# Patient Record
Sex: Female | Born: 1955
Health system: Southern US, Community
[De-identification: ages and names within clinical notes are randomized; demographics above are authoritative.]

## PROBLEM LIST (undated history)

## (undated) DIAGNOSIS — I739 Peripheral vascular disease, unspecified: Secondary | ICD-10-CM

## (undated) DIAGNOSIS — R0600 Dyspnea, unspecified: Secondary | ICD-10-CM

## (undated) DIAGNOSIS — Z8673 Personal history of transient ischemic attack (TIA), and cerebral infarction without residual deficits: Secondary | ICD-10-CM

## (undated) DIAGNOSIS — I2510 Atherosclerotic heart disease of native coronary artery without angina pectoris: Secondary | ICD-10-CM

## (undated) DIAGNOSIS — I639 Cerebral infarction, unspecified: Secondary | ICD-10-CM

## (undated) DIAGNOSIS — I219 Acute myocardial infarction, unspecified: Secondary | ICD-10-CM

## (undated) DIAGNOSIS — J189 Pneumonia, unspecified organism: Secondary | ICD-10-CM

## (undated) DIAGNOSIS — K219 Gastro-esophageal reflux disease without esophagitis: Secondary | ICD-10-CM

## (undated) DIAGNOSIS — IMO0001 Reserved for inherently not codable concepts without codable children: Secondary | ICD-10-CM

## (undated) DIAGNOSIS — J449 Chronic obstructive pulmonary disease, unspecified: Secondary | ICD-10-CM

## (undated) DIAGNOSIS — I350 Nonrheumatic aortic (valve) stenosis: Secondary | ICD-10-CM

## (undated) DIAGNOSIS — J45909 Unspecified asthma, uncomplicated: Secondary | ICD-10-CM

## (undated) DIAGNOSIS — F419 Anxiety disorder, unspecified: Secondary | ICD-10-CM

## (undated) HISTORY — PX: CARDIAC CATHETERIZATION: SHX172

## (undated) HISTORY — PX: APPENDECTOMY: SHX54

## (undated) HISTORY — PX: CHOLECYSTECTOMY: SHX55

---

## 2005-10-13 ENCOUNTER — Ambulatory Visit: Payer: Self-pay

## 2014-06-18 ENCOUNTER — Encounter (INDEPENDENT_AMBULATORY_CARE_PROVIDER_SITE_OTHER): Payer: Self-pay | Admitting: *Deleted

## 2014-07-03 ENCOUNTER — Encounter (INDEPENDENT_AMBULATORY_CARE_PROVIDER_SITE_OTHER): Payer: Self-pay | Admitting: *Deleted

## 2014-07-03 ENCOUNTER — Other Ambulatory Visit (INDEPENDENT_AMBULATORY_CARE_PROVIDER_SITE_OTHER): Payer: Self-pay | Admitting: *Deleted

## 2014-07-03 DIAGNOSIS — Z1211 Encounter for screening for malignant neoplasm of colon: Secondary | ICD-10-CM

## 2014-07-23 ENCOUNTER — Other Ambulatory Visit (HOSPITAL_COMMUNITY): Payer: Self-pay | Admitting: Internal Medicine

## 2014-07-23 DIAGNOSIS — Z1231 Encounter for screening mammogram for malignant neoplasm of breast: Secondary | ICD-10-CM

## 2014-08-02 ENCOUNTER — Ambulatory Visit (HOSPITAL_COMMUNITY)
Admission: RE | Admit: 2014-08-02 | Discharge: 2014-08-02 | Disposition: A | Discharge status: Home / Self Care | Payer: 59 | Source: Ambulatory Visit | Attending: Internal Medicine | Admitting: Internal Medicine

## 2014-08-02 DIAGNOSIS — Z1231 Encounter for screening mammogram for malignant neoplasm of breast: Secondary | ICD-10-CM

## 2014-08-13 ENCOUNTER — Telehealth (INDEPENDENT_AMBULATORY_CARE_PROVIDER_SITE_OTHER): Payer: Self-pay | Admitting: *Deleted

## 2014-08-13 MED ORDER — PEG 3350-KCL-NA BICARB-NACL 420 G PO SOLR: 4000.0000 mL | Freq: Once | ORAL | Status: DC

## 2014-08-13 NOTE — Telephone Encounter (Signed)
Patient needs trilyte 

## 2014-09-02 ENCOUNTER — Telehealth (INDEPENDENT_AMBULATORY_CARE_PROVIDER_SITE_OTHER): Payer: Self-pay | Admitting: *Deleted

## 2014-09-02 NOTE — Telephone Encounter (Signed)
Referring MD/PCP: vyas   Procedure: tcs  Reason/Indication:  screening  Has patient had this procedure before?  no  If so, when, by whom and where?    Is there a family history of colon cancer?  no  Who?  What age when diagnosed?    Is patient diabetic?   no      Does patient have prosthetic heart valve?  no  Do you have a pacemaker?  no  Has patient ever had endocarditis? no  Has patient had joint replacement within last 12 months?  no  Does patient tend to be constipated or take laxatives? no  Is patient on Coumadin, Plavix and/or Aspirin? yes  Medications: asa 81 mg daily, albuterol inhaler, symbicort inhaler  Allergies: nkda  Medication Adjustment: asa 2 days  Procedure date & time: 10/03/14 at 730

## 2014-09-04 NOTE — Telephone Encounter (Signed)
agree

## 2014-10-02 DIAGNOSIS — K648 Other hemorrhoids: Secondary | ICD-10-CM | POA: Diagnosis not present

## 2014-10-02 DIAGNOSIS — Z1211 Encounter for screening for malignant neoplasm of colon: Secondary | ICD-10-CM | POA: Diagnosis not present

## 2014-10-02 DIAGNOSIS — K5730 Diverticulosis of large intestine without perforation or abscess without bleeding: Secondary | ICD-10-CM | POA: Diagnosis not present

## 2014-10-03 ENCOUNTER — Encounter (HOSPITAL_COMMUNITY): Payer: Self-pay | Admitting: *Deleted

## 2014-10-03 ENCOUNTER — Ambulatory Visit (HOSPITAL_COMMUNITY)
Admission: RE | Admit: 2014-10-03 | Discharge: 2014-10-03 | Disposition: A | Discharge status: Home Health | Payer: 59 | Source: Ambulatory Visit | Attending: Internal Medicine | Admitting: Internal Medicine

## 2014-10-03 ENCOUNTER — Encounter (HOSPITAL_COMMUNITY)
Admission: RE | Disposition: A | Discharge status: Home Health | Payer: Self-pay | Source: Ambulatory Visit | Attending: Internal Medicine

## 2014-10-03 DIAGNOSIS — K5730 Diverticulosis of large intestine without perforation or abscess without bleeding: Secondary | ICD-10-CM | POA: Insufficient documentation

## 2014-10-03 DIAGNOSIS — K644 Residual hemorrhoidal skin tags: Secondary | ICD-10-CM | POA: Insufficient documentation

## 2014-10-03 DIAGNOSIS — K219 Gastro-esophageal reflux disease without esophagitis: Secondary | ICD-10-CM | POA: Insufficient documentation

## 2014-10-03 DIAGNOSIS — Z8371 Family history of colonic polyps: Secondary | ICD-10-CM | POA: Diagnosis not present

## 2014-10-03 DIAGNOSIS — J449 Chronic obstructive pulmonary disease, unspecified: Secondary | ICD-10-CM | POA: Insufficient documentation

## 2014-10-03 DIAGNOSIS — Z7982 Long term (current) use of aspirin: Secondary | ICD-10-CM | POA: Insufficient documentation

## 2014-10-03 DIAGNOSIS — Z87891 Personal history of nicotine dependence: Secondary | ICD-10-CM | POA: Insufficient documentation

## 2014-10-03 DIAGNOSIS — Z1211 Encounter for screening for malignant neoplasm of colon: Secondary | ICD-10-CM

## 2014-10-03 DIAGNOSIS — Z79899 Other long term (current) drug therapy: Secondary | ICD-10-CM | POA: Diagnosis not present

## 2014-10-03 HISTORY — PX: COLONOSCOPY: SHX5424

## 2014-10-03 HISTORY — DX: Reserved for inherently not codable concepts without codable children: IMO0001

## 2014-10-03 HISTORY — DX: Gastro-esophageal reflux disease without esophagitis: K21.9

## 2014-10-03 HISTORY — DX: Chronic obstructive pulmonary disease, unspecified: J44.9

## 2014-10-03 SURGERY — COLONOSCOPY
Anesthesia: Moderate Sedation

## 2014-10-03 MED ORDER — MIDAZOLAM HCL 5 MG/5ML IJ SOLN
INTRAMUSCULAR | Status: AC
  Filled 2014-10-03: qty 10

## 2014-10-03 MED ORDER — SODIUM CHLORIDE 0.9 % IV SOLN
INTRAVENOUS | Status: DC
  Administered 2014-10-03: 07:00:00 via INTRAVENOUS

## 2014-10-03 MED ORDER — MIDAZOLAM HCL 5 MG/5ML IJ SOLN
INTRAMUSCULAR | Status: DC | PRN
  Administered 2014-10-03 (×3): 2 mg via INTRAVENOUS
  Administered 2014-10-03: 3 mg via INTRAVENOUS
  Administered 2014-10-03 (×2): 2 mg via INTRAVENOUS
  Administered 2014-10-03: 3 mg via INTRAVENOUS

## 2014-10-03 MED ORDER — MEPERIDINE HCL 50 MG/ML IJ SOLN
INTRAMUSCULAR | Status: DC
Start: 2014-10-03 — End: 2014-10-03
  Filled 2014-10-03: qty 1

## 2014-10-03 MED ORDER — MEPERIDINE HCL 50 MG/ML IJ SOLN
INTRAMUSCULAR | Status: AC
  Filled 2014-10-03: qty 1

## 2014-10-03 MED ORDER — MIDAZOLAM HCL 5 MG/5ML IJ SOLN
INTRAMUSCULAR | Status: AC
  Filled 2014-10-03: qty 5

## 2014-10-03 MED ORDER — MEPERIDINE HCL 50 MG/ML IJ SOLN
INTRAMUSCULAR | Status: DC | PRN
  Administered 2014-10-03 (×4): 25 mg via INTRAVENOUS

## 2014-10-03 NOTE — Op Note (Signed)
COLONOSCOPY PROCEDURE REPORT  PATIENT:  Emily Rocha  MR#:  [id number] Birthdate:  19-Dec-1955, 59 y.o., female Endoscopist:  Dr. Rogene Houston, MD Referred By:  Dr. Glenda Chroman, MD Procedure Date: 10/03/2014  Procedure:   Colonoscopy  Indications: Patient is 59 year old Caucasian female was undergoing average risk screening colonoscopy. Family history is negative for CRC brother who is 4 years younger has had polyps removed.  Informed Consent:  The procedure and risks were reviewed with the patient and informed consent was obtained.  Medications:  Demerol 100 mg IV Versed 16 mg IV  Description of procedure:  After a digital rectal exam was performed, that colonoscope was advanced from the anus through the rectum and colon to the area of the cecum, ileocecal valve and appendiceal orifice. The cecum was deeply intubated. These structures were well-seen and photographed for the record. From the level of the cecum and ileocecal valve, the scope was slowly and cautiously withdrawn. The mucosal surfaces were carefully surveyed utilizing scope tip to flexion to facilitate fold flattening as needed. The scope was pulled down into the rectum where a thorough exam including retroflexion was performed. Ultra Slim scope was also used for this procedure.  Findings:   Prep satisfactory. Tortuous hepatic flexure. Ultra Slim scope was used to complete exam. Scattered diverticula at descending colon along with multiple diverticula at sigmoid colon. Normal rectal mucosa. 1 hemorrhoids below the dentate line to    Therapeutic/Diagnostic Maneuvers Performed:  None  Complications:  None  Cecal Withdrawal Time:  6 minutes  Impression:  Examination performed to cecum. Left-sided diverticulosis. She has multiple diverticula at sigmoid colon. Small external hemorrhoids.  Recommendations:  Standard instructions given. High fiber diet. Next screening exam in 10 years which should be with monitored  anesthesia care as she is hard to sedate.  REHMAN,NAJEEB U  10/03/2014 9:05 AM  CC: Dr. Glenda Chroman., MD & Dr. Rayne Du ref. provider found

## 2014-10-03 NOTE — Discharge Instructions (Signed)
Resume usual medications and high fiber diet. No driving for 24 hours. Next screening exam in 10 years.  Colonoscopy, Care After Refer to this sheet in the next few weeks. These instructions provide you with information on caring for yourself after your procedure. Your health care provider may also give you more specific instructions. Your treatment has been planned according to current medical practices, but problems sometimes occur. Call your health care provider if you have any problems or questions after your procedure. WHAT TO EXPECT AFTER THE PROCEDURE  After your procedure, it is typical to have the following:  A small amount of blood in your stool.  Moderate amounts of gas and mild abdominal cramping or bloating. HOME CARE INSTRUCTIONS  Do not drive, operate machinery, or sign important documents for 24 hours.  You may shower and resume your regular physical activities, but move at a slower pace for the first 24 hours.  Take frequent rest periods for the first 24 hours.  Walk around or put a warm pack on your abdomen to help reduce abdominal cramping and bloating.  Drink enough fluids to keep your urine clear or pale yellow.  You may resume your normal diet as instructed by your health care provider. Avoid heavy or fried foods that are hard to digest.  Avoid drinking alcohol for 24 hours or as instructed by your health care provider.  Only take over-the-counter or prescription medicines as directed by your health care provider.  If a tissue sample (biopsy) was taken during your procedure:  Do not take aspirin or blood thinners for 7 days, or as instructed by your health care provider.  Do not drink alcohol for 7 days, or as instructed by your health care provider.  Eat soft foods for the first 24 hours. SEEK MEDICAL CARE IF: You have persistent spotting of blood in your stool 2-3 days after the procedure. SEEK IMMEDIATE MEDICAL CARE IF:  You have more than a small  spotting of blood in your stool.  You pass large blood clots in your stool.  Your abdomen is swollen (distended).  You have nausea or vomiting.  You have a fever.  You have increasing abdominal pain that is not relieved with medicine. High-Fiber Diet Fiber is found in fruits, vegetables, and grains. A high-fiber diet encourages the addition of more whole grains, legumes, fruits, and vegetables in your diet. The recommended amount of fiber for adult males is 38 g per day. For adult females, it is 25 g per day. Pregnant and lactating women should get 28 g of fiber per day. If you have a digestive or bowel problem, ask your caregiver for advice before adding high-fiber foods to your diet. Eat a variety of high-fiber foods instead of only a select few type of foods.  PURPOSE  To increase stool bulk.  To make bowel movements more regular to prevent constipation.  To lower cholesterol.  To prevent overeating. WHEN IS THIS DIET USED?  It may be used if you have constipation and hemorrhoids.  It may be used if you have uncomplicated diverticulosis (intestine condition) and irritable bowel syndrome.  It may be used if you need help with weight management.  It may be used if you want to add it to your diet as a protective measure against atherosclerosis, diabetes, and cancer. SOURCES OF FIBER  Whole-grain breads and cereals.  Fruits, such as apples, oranges, bananas, berries, prunes, and pears.  Vegetables, such as green peas, carrots, sweet potatoes, beets, broccoli, cabbage,  spinach, and artichokes.  Legumes, such split peas, soy, lentils.  Almonds. FIBER CONTENT IN FOODS Starches and Grains / Dietary Fiber (g)  Cheerios, 1 cup / 3 g  Corn Flakes cereal, 1 cup / 0.7 g  Rice crispy treat cereal, 1 cup / 0.3 g  Instant oatmeal (cooked),  cup / 2 g  Frosted wheat cereal, 1 cup / 5.1 g  Brown, long-grain rice (cooked), 1 cup / 3.5 g  White, long-grain rice (cooked), 1  cup / 0.6 g  Enriched macaroni (cooked), 1 cup / 2.5 g Legumes / Dietary Fiber (g)  Baked beans (canned, plain, or vegetarian),  cup / 5.2 g  Kidney beans (canned),  cup / 6.8 g  Pinto beans (cooked),  cup / 5.5 g Breads and Crackers / Dietary Fiber (g)  Plain or honey graham crackers, 2 squares / 0.7 g  Saltine crackers, 3 squares / 0.3 g  Plain, salted pretzels, 10 pieces / 1.8 g  Whole-wheat bread, 1 slice / 1.9 g  White bread, 1 slice / 0.7 g  Raisin bread, 1 slice / 1.2 g  Plain bagel, 3 oz / 2 g  Flour tortilla, 1 oz / 0.9 g  Corn tortilla, 1 small / 1.5 g  Hamburger or hotdog bun, 1 small / 0.9 g Fruits / Dietary Fiber (g)  Apple with skin, 1 medium / 4.4 g  Sweetened applesauce,  cup / 1.5 g  Banana,  medium / 1.5 g  Grapes, 10 grapes / 0.4 g  Orange, 1 small / 2.3 g  Raisin, 1.5 oz / 1.6 g  Melon, 1 cup / 1.4 g Vegetables / Dietary Fiber (g)  Green beans (canned),  cup / 1.3 g  Carrots (cooked),  cup / 2.3 g  Broccoli (cooked),  cup / 2.8 g  Peas (cooked),  cup / 4.4 g  Mashed potatoes,  cup / 1.6 g  Lettuce, 1 cup / 0.5 g  Corn (canned),  cup / 1.6 g  Tomato,  cup / 1.1 g Diverticulosis Diverticulosis is the condition that develops when small pouches (diverticula) form in the wall of your colon. Your colon, or large intestine, is where water is absorbed and stool is formed. The pouches form when the inside layer of your colon pushes through weak spots in the outer layers of your colon. CAUSES  No one knows exactly what causes diverticulosis. RISK FACTORS  Being older than 2. Your risk for this condition increases with age. Diverticulosis is rare in people younger than 40 years. By age 8, almost everyone has it.  Eating a low-fiber diet.  Being frequently constipated.  Being overweight.  Not getting enough exercise.  Smoking.  Taking over-the-counter pain medicines, like aspirin and ibuprofen. SYMPTOMS  Most  people with diverticulosis do not have symptoms. DIAGNOSIS  Because diverticulosis often has no symptoms, health care providers often discover the condition during an exam for other colon problems. In many cases, a health care provider will diagnose diverticulosis while using a flexible scope to examine the colon (colonoscopy). TREATMENT  If you have never developed an infection related to diverticulosis, you may not need treatment. If you have had an infection before, treatment may include:  Eating more fruits, vegetables, and grains.  Taking a fiber supplement.  Taking a live bacteria supplement (probiotic).  Taking medicine to relax your colon. HOME CARE INSTRUCTIONS   Drink at least 6-8 glasses of water each day to prevent constipation.  Try not to strain when  you have a bowel movement.  Keep all follow-up appointments. If you have had an infection before:  Increase the fiber in your diet as directed by your health care provider or dietitian.  Take a dietary fiber supplement if your health care provider approves.  Only take medicines as directed by your health care provider. SEEK MEDICAL CARE IF:   You have abdominal pain.  You have bloating.  You have cramps.  You have not gone to the bathroom in 3 days. SEEK IMMEDIATE MEDICAL CARE IF:   Your pain gets worse.  Yourbloating becomes very bad.  You have a fever or chills, and your symptoms suddenly get worse.  You begin vomiting.  You have bowel movements that are bloody or black. MAKE SURE YOU:  Understand these instructions.  Will watch your condition.  Will get help right away if you are not doing well or get worse.

## 2014-10-03 NOTE — H&P (Addendum)
Emily Rocha is an 59 y.o. female.   Chief Complaint: Patient is here for colonoscopy. HPI: Patient is 59 year old Caucasian female who is here for screening colonoscopy. She denies abdominal pain change in bowel habits or rectal bleeding. This is patient's first exam. Family history is negative for CRC but her younger brother has had multiple polyps removed.  Past Medical History  Diagnosis Date  . COPD (chronic obstructive pulmonary disease)   . Shortness of breath dyspnea   . GERD (gastroesophageal reflux disease)     Past Surgical History  Procedure Laterality Date  . Cholecystectomy      History reviewed. No pertinent family history. Social History:  reports that she quit smoking about 4 years ago. She does not have any smokeless tobacco history on file. She reports that she does not drink alcohol or use illicit drugs.  Allergies: No Known Allergies  Medications Prior to Admission  Medication Sig Dispense Refill  . aspirin EC 81 MG tablet Take 81 mg by mouth daily.    . Cholecalciferol (VITAMIN D PO) Take 1 tablet by mouth daily.    . pantoprazole (PROTONIX) 40 MG tablet Take 1 tablet by mouth daily.  0  . polyethylene glycol-electrolytes (NULYTELY/GOLYTELY) 420 G solution Take 4,000 mLs by mouth once. 4000 mL 0  . SYMBICORT 160-4.5 MCG/ACT inhaler Inhale 2 puffs into the lungs daily.  0  . VENTOLIN HFA 108 (90 BASE) MCG/ACT inhaler Inhale 2 puffs into the lungs every 6 (six) hours as needed.  0    No results found for this or any previous visit (from the past 48 hour(s)). No results found.  ROS  Blood pressure 145/77, pulse 93, temperature 98.4 F (36.9 C), temperature source Oral, resp. rate 19, height '5\' 5"'$  (1.651 m), weight 178 lb (80.74 kg), SpO2 100 %. Physical Exam  Constitutional: She appears well-developed and well-nourished.  HENT:  Mouth/Throat: Oropharynx is clear and moist. No oropharyngeal exudate.  Eyes: Conjunctivae are normal. No scleral icterus.   Neck: No thyromegaly present.  Cardiovascular: Normal rate, regular rhythm and normal heart sounds.   No murmur heard. Respiratory: Effort normal and breath sounds normal.  GI: Soft. She exhibits no distension and no mass. There is no tenderness.  Right subcostal scar  Musculoskeletal: She exhibits no edema.  Lymphadenopathy:    She has no cervical adenopathy.  Neurological: She is alert.  Skin: Skin is warm and dry.     Assessment/Plan Average risk screening colonoscopy.  Eleftherios Dudenhoeffer U 10/03/2014, 7:34 AM

## 2014-10-04 ENCOUNTER — Encounter (HOSPITAL_COMMUNITY): Payer: Self-pay | Admitting: Internal Medicine

## 2014-11-26 ENCOUNTER — Encounter: Payer: Self-pay | Admitting: Vascular Surgery

## 2014-12-03 ENCOUNTER — Other Ambulatory Visit: Payer: Self-pay | Admitting: *Deleted

## 2014-12-03 DIAGNOSIS — I6523 Occlusion and stenosis of bilateral carotid arteries: Secondary | ICD-10-CM

## 2014-12-03 NOTE — Progress Notes (Signed)
Carotid duplex ordered per Dr. Donnetta Hutching after viewing this patient's MRA from [facility] done 11-19-14.  Spoke with Levada Dy, NP at [facility] regarding patient's increased numbness. New patient visit has been moved up from 12-11-14 per Dr. Donnetta Hutching.

## 2014-12-04 ENCOUNTER — Ambulatory Visit (INDEPENDENT_AMBULATORY_CARE_PROVIDER_SITE_OTHER)
Admission: RE | Admit: 2014-12-04 | Discharge: 2014-12-04 | Disposition: A | Discharge status: Home / Self Care | Payer: 59 | Source: Ambulatory Visit | Attending: Vascular Surgery | Admitting: Vascular Surgery

## 2014-12-04 ENCOUNTER — Inpatient Hospital Stay (HOSPITAL_COMMUNITY): Payer: 59

## 2014-12-04 ENCOUNTER — Encounter (HOSPITAL_COMMUNITY): Payer: Self-pay | Admitting: *Deleted

## 2014-12-04 ENCOUNTER — Inpatient Hospital Stay (HOSPITAL_COMMUNITY)
Admission: AD | Admit: 2014-12-04 | Discharge: 2014-12-07 | DRG: 039 | Disposition: A | Discharge status: Home / Self Care | Payer: 59 | Source: Ambulatory Visit | Attending: Vascular Surgery | Admitting: Vascular Surgery

## 2014-12-04 ENCOUNTER — Ambulatory Visit (INDEPENDENT_AMBULATORY_CARE_PROVIDER_SITE_OTHER): Payer: 59 | Admitting: Vascular Surgery

## 2014-12-04 ENCOUNTER — Encounter: Payer: Self-pay | Admitting: Vascular Surgery

## 2014-12-04 VITALS — BP 139/64 | HR 99 | Ht 65.0 in | Wt 184.0 lb

## 2014-12-04 DIAGNOSIS — Z7982 Long term (current) use of aspirin: Secondary | ICD-10-CM

## 2014-12-04 DIAGNOSIS — Z7951 Long term (current) use of inhaled steroids: Secondary | ICD-10-CM | POA: Diagnosis not present

## 2014-12-04 DIAGNOSIS — I6523 Occlusion and stenosis of bilateral carotid arteries: Principal | ICD-10-CM | POA: Diagnosis present

## 2014-12-04 DIAGNOSIS — Z79899 Other long term (current) drug therapy: Secondary | ICD-10-CM | POA: Diagnosis not present

## 2014-12-04 DIAGNOSIS — I6521 Occlusion and stenosis of right carotid artery: Secondary | ICD-10-CM

## 2014-12-04 DIAGNOSIS — Z87891 Personal history of nicotine dependence: Secondary | ICD-10-CM

## 2014-12-04 DIAGNOSIS — K219 Gastro-esophageal reflux disease without esophagitis: Secondary | ICD-10-CM | POA: Diagnosis present

## 2014-12-04 DIAGNOSIS — I6529 Occlusion and stenosis of unspecified carotid artery: Secondary | ICD-10-CM | POA: Diagnosis present

## 2014-12-04 DIAGNOSIS — J449 Chronic obstructive pulmonary disease, unspecified: Secondary | ICD-10-CM | POA: Diagnosis present

## 2014-12-04 LAB — URINALYSIS, ROUTINE W REFLEX MICROSCOPIC
Bilirubin Urine: NEGATIVE
GLUCOSE, UA: NEGATIVE mg/dL
Hgb urine dipstick: NEGATIVE
KETONES UR: NEGATIVE mg/dL
LEUKOCYTES UA: NEGATIVE
NITRITE: NEGATIVE
PROTEIN: NEGATIVE mg/dL
Specific Gravity, Urine: 1.022 (ref 1.005–1.030)
UROBILINOGEN UA: 0.2 mg/dL (ref 0.0–1.0)
pH: 5 (ref 5.0–8.0)

## 2014-12-04 LAB — COMPREHENSIVE METABOLIC PANEL
ALT: 37 U/L (ref 14–54)
ANION GAP: 10 (ref 5–15)
AST: 22 U/L (ref 15–41)
Albumin: 3.7 g/dL (ref 3.5–5.0)
Alkaline Phosphatase: 61 U/L (ref 38–126)
BUN: 13 mg/dL (ref 6–20)
CHLORIDE: 103 mmol/L (ref 101–111)
CO2: 30 mmol/L (ref 22–32)
Calcium: 9.9 mg/dL (ref 8.9–10.3)
Creatinine, Ser: 0.8 mg/dL (ref 0.44–1.00)
GFR calc Af Amer: >60 mL/min (ref >60)
GFR calc non Af Amer: >60 mL/min (ref >60)
Glucose, Bld: 160 mg/dL — ABNORMAL HIGH (ref 65–99)
POTASSIUM: 3.8 mmol/L (ref 3.5–5.1)
SODIUM: 143 mmol/L (ref 135–145)
Total Bilirubin: 0.4 mg/dL (ref 0.3–1.2)
Total Protein: 6.7 g/dL (ref 6.5–8.1)

## 2014-12-04 LAB — CBC
HCT: 41 % (ref 36.0–46.0)
Hemoglobin: 13.2 g/dL (ref 12.0–15.0)
MCH: 28.3 pg (ref 26.0–34.0)
MCHC: 32.2 g/dL (ref 30.0–36.0)
MCV: 87.8 fL (ref 78.0–100.0)
PLATELETS: 213 10*3/uL (ref 150–400)
RBC: 4.67 MIL/uL (ref 3.87–5.11)
RDW: 14.4 % (ref 11.5–15.5)
WBC: 6 10*3/uL (ref 4.0–10.5)

## 2014-12-04 LAB — PROTIME-INR
INR: 0.99 (ref 0.00–1.49)
Prothrombin Time: 13.3 seconds (ref 11.6–15.2)

## 2014-12-04 MED ORDER — ALBUTEROL SULFATE HFA 108 (90 BASE) MCG/ACT IN AERS: 2.0000 | INHALATION_SPRAY | Freq: Four times a day (QID) | RESPIRATORY_TRACT | Status: DC | PRN

## 2014-12-04 MED ORDER — POTASSIUM CHLORIDE CRYS ER 20 MEQ PO TBCR
20.0000 meq | EXTENDED_RELEASE_TABLET | Freq: Once | ORAL | Status: AC
  Administered 2014-12-04: 20 meq via ORAL
  Filled 2014-12-04: qty 1

## 2014-12-04 MED ORDER — CEFAZOLIN SODIUM 1-5 GM-% IV SOLN
1.0000 g | INTRAVENOUS | Status: DC
  Filled 2014-12-04: qty 50

## 2014-12-04 MED ORDER — ASPIRIN EC 81 MG PO TBEC
81.0000 mg | DELAYED_RELEASE_TABLET | Freq: Every day | ORAL | Status: DC
  Administered 2014-12-04 – 2014-12-05 (×2): 81 mg via ORAL
  Filled 2014-12-04 (×2): qty 1

## 2014-12-04 MED ORDER — HYDRALAZINE HCL 20 MG/ML IJ SOLN: 5.0000 mg | INTRAMUSCULAR | Status: DC | PRN

## 2014-12-04 MED ORDER — SODIUM CHLORIDE 0.45 % IV SOLN
INTRAVENOUS | Status: DC
  Administered 2014-12-04: 19:00:00 via INTRAVENOUS

## 2014-12-04 MED ORDER — ALBUTEROL SULFATE (2.5 MG/3ML) 0.083% IN NEBU
2.5000 mg | INHALATION_SOLUTION | Freq: Four times a day (QID) | RESPIRATORY_TRACT | Status: DC | PRN
  Administered 2014-12-06: 2.5 mg via RESPIRATORY_TRACT
  Filled 2014-12-04: qty 3

## 2014-12-04 MED ORDER — ALPRAZOLAM 0.5 MG PO TABS
0.5000 mg | ORAL_TABLET | Freq: Every evening | ORAL | Status: DC | PRN
  Administered 2014-12-04 – 2014-12-06 (×3): 0.5 mg via ORAL
  Filled 2014-12-04 (×3): qty 1

## 2014-12-04 MED ORDER — METOPROLOL TARTRATE 1 MG/ML IV SOLN: 2.0000 mg | INTRAVENOUS | Status: DC | PRN

## 2014-12-04 MED ORDER — ONDANSETRON HCL 4 MG/2ML IJ SOLN: 4.0000 mg | Freq: Four times a day (QID) | INTRAMUSCULAR | Status: DC | PRN

## 2014-12-04 MED ORDER — MORPHINE SULFATE (PF) 2 MG/ML IV SOLN
2.0000 mg | INTRAVENOUS | Status: DC | PRN
  Administered 2014-12-05: 2 mg via INTRAVENOUS
  Filled 2014-12-04: qty 1

## 2014-12-04 MED ORDER — GUAIFENESIN-DM 100-10 MG/5ML PO SYRP: 15.0000 mL | ORAL_SOLUTION | ORAL | Status: DC | PRN

## 2014-12-04 MED ORDER — HEPARIN (PORCINE) IN NACL 100-0.45 UNIT/ML-% IJ SOLN
1250.0000 [IU]/h | INTRAMUSCULAR | Status: AC
  Administered 2014-12-04: 1250 [IU]/h via INTRAVENOUS
  Filled 2014-12-04: qty 250

## 2014-12-04 MED ORDER — HEPARIN BOLUS VIA INFUSION
4000.0000 [IU] | Freq: Once | INTRAVENOUS | Status: AC
  Administered 2014-12-04: 4000 [IU] via INTRAVENOUS
  Filled 2014-12-04: qty 4000

## 2014-12-04 MED ORDER — LABETALOL HCL 5 MG/ML IV SOLN: 10.0000 mg | INTRAVENOUS | Status: DC | PRN

## 2014-12-04 MED ORDER — BUDESONIDE-FORMOTEROL FUMARATE 160-4.5 MCG/ACT IN AERO
2.0000 | INHALATION_SPRAY | Freq: Every day | RESPIRATORY_TRACT | Status: DC
  Administered 2014-12-05 – 2014-12-07 (×3): 2 via RESPIRATORY_TRACT
  Filled 2014-12-04 (×2): qty 6

## 2014-12-04 MED ORDER — PHENOL 1.4 % MT LIQD
1.0000 | OROMUCOSAL | Status: DC | PRN
  Administered 2014-12-06: 1 via OROMUCOSAL
  Filled 2014-12-04: qty 177

## 2014-12-04 MED ORDER — ALUM & MAG HYDROXIDE-SIMETH 200-200-20 MG/5ML PO SUSP
15.0000 mL | ORAL | Status: DC | PRN
Start: 2014-12-04 — End: 2014-12-07

## 2014-12-04 MED ORDER — OXYCODONE-ACETAMINOPHEN 5-325 MG PO TABS
1.0000 | ORAL_TABLET | ORAL | Status: DC | PRN
  Administered 2014-12-05: 2 via ORAL
  Filled 2014-12-04: qty 2

## 2014-12-04 MED ORDER — PANTOPRAZOLE SODIUM 40 MG PO TBEC
40.0000 mg | DELAYED_RELEASE_TABLET | Freq: Every day | ORAL | Status: DC
  Administered 2014-12-04 – 2014-12-07 (×4): 40 mg via ORAL
  Filled 2014-12-04 (×4): qty 1

## 2014-12-04 NOTE — Progress Notes (Signed)
ANTICOAGULATION CONSULT NOTE - Initial Consult  Pharmacy Consult for Heparin Indication: bilateral carotid stenosis  No Known Allergies  Patient Measurements: 83.5 kg  Vital Signs: BP: 139/64 mmHg (10/19 1307) Pulse Rate: 99 (10/19 1305)  Labs: No results for input(s): HGB, HCT, PLT, APTT, LABPROT, INR, HEPARINUNFRC, CREATININE, CKTOTAL, CKMB, TROPONINI in the last 72 hours.  CrCl cannot be calculated (Patient has no serum creatinine result on file.).   Medical History: Past Medical History  Diagnosis Date  . COPD (chronic obstructive pulmonary disease) ([place])   . Shortness of breath dyspnea   . GERD (gastroesophageal reflux disease)     Assessment: 59 year old female to begin heparin for bilateral carotid stenosis in preparation for carotid angiogram tomorrow  Goal of Therapy:  Heparin level 0.3-0.7 units/ml Monitor platelets by anticoagulation protocol: Yes   Plan:  Heparin 4000 units iv bolus x 1 Heparin drip at 1250 units / hr Heparin level 6 hours after heparin starts Daily heparin level, CBC  Thank you Anette Guarneri, PharmD [phone]  12/04/2014,4:56 PM

## 2014-12-04 NOTE — Progress Notes (Signed)
Called Dr. Woody Seller' office [phone]) and Fraser Din will notify him of this patient's admission and angiogram tomorrow. She will send through the appropriate authorizations to [facility] for this admission to [facility].

## 2014-12-04 NOTE — Progress Notes (Addendum)
New Carotid Patient  Referred by:  Glenda Chroman, MD [place], [place] [address]  Reason for referral: symptomatic bilateral carotid stenosis  History of Present Illness  Emily Rocha is a 59 y.o. (1955/10/01) female who presents for evaluation of bilateral carotid stenosis. Three years ago, she had an isolated episode of numbness affecting her left posterior neck, left arm and left leg. This resolved within a few minutes. She went to see a physician at that time who recommended a carotid duplex. The patient declined the test due to financial reasons. She was advised to start on a baby aspirin and to report to the hospital if a similar episode occurred.   About 5 weeks ago, she started having similar symptoms of left sided numbness that would resolve after a few minutes. She denies any weakness. These episodes have occurred intermittently during the past 5 weeks. She has seen her PCP who referred her here for further evaluation. Following her carotid duplex today in the office, she reports left sided numbness. Additionally, she reported an episode of numbness while being examined by Dr. Oneida Alar.   The patient has also had two episodes of temporary right eye blindness described as a "cloud" covering her right eye. The most recent episodes being yesterday. These episodes would last for approximately a minute. She denies expressive or receptive aphasia.   The patient does not smoke cigarettes but has used a vaporizer for the past 5 years. She says she is trying to cut down. She has no history of hypertension, hyperlipidemia or diabetes. She does have COPD. 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   She denies any history of claudication or rest pain. She denies any cardiac history or prior history of stroke. She currently takes a baby aspirin daily.   Past Medical History    Diagnosis Date  . COPD (chronic obstructive pulmonary disease) ([place])   . Shortness of breath dyspnea   . GERD (gastroesophageal reflux disease)     Past Surgical History  Procedure Laterality Date  . Cholecystectomy    . Colonoscopy N/A 10/03/2014    Procedure: COLONOSCOPY;  Surgeon: Rogene Houston, MD;  Location: [facility]ITE;  Service: Endoscopy;  Laterality: N/A;  730    Social History   Social History  . Marital Status: Divorced    Spouse Name: N/A  . Number of Children: N/A  . Years of Education: N/A   Occupational History  . Not on file.   Social History Main Topics  . Smoking status: Former Smoker -- 2.00 packs/day for 40 years    Quit date: 10/03/2010  . Smokeless tobacco: Not on file  . Alcohol Use: No  . Drug Use: No  . Sexual Activity: Not on file   Other Topics Concern  . Not on file   Social History Narrative    Family History  Problem Relation Age of Onset  . Heart attack Mother   . Cancer Mother   . Diabetes Mother   . Heart disease Mother   . Heart attack Father   . Heart disease Father    Current Outpatient Prescriptions on File Prior to Visit  Medication Sig Dispense Refill  . ALPRAZolam (XANAX) 0.5 MG tablet Take 0.5 mg by mouth as needed for anxiety (take one (1) TABLET one hour po before test).    Marland Kitchen aspirin  EC 81 MG tablet Take 81 mg by mouth daily.    . pantoprazole (PROTONIX) 40 MG tablet Take 1 tablet by mouth daily.  0  . SYMBICORT 160-4.5 MCG/ACT inhaler Inhale 2 puffs into the lungs daily.  0  . VENTOLIN HFA 108 (90 BASE) MCG/ACT inhaler Inhale 2 puffs into the lungs every 6 (six) hours as needed.  0  . Cholecalciferol (VITAMIN D PO) Take 1 tablet by mouth daily.     No current facility-administered medications on file prior to visit.    No Known Allergies  REVIEW OF SYSTEMS:  (Positives checked otherwise negative)  CARDIOVASCULAR:  '[]'$  chest pain, '[]'$  chest pressure, '[]'$  palpitations, '[]'$  shortness of breath when laying flat,  '[]'$  shortness of breath with exertion,  '[]'$  pain in feet when walking, '[]'$  pain in feet when laying flat, '[]'$  history of blood clot in veins (DVT), '[]'$  history of phlebitis, '[]'$  swelling in legs, '[]'$  varicose veins  PULMONARY:  '[]'$  productive cough, '[]'$  asthma, '[]'$  wheezing  NEUROLOGIC:  '[]'$  weakness in arms or legs, '[x]'$  numbness in arms or legs, '[]'$  difficulty speaking or slurred speech, '[]'$  temporary loss of vision in one eye, '[]'$  dizziness  HEMATOLOGIC:  '[]'$  bleeding problems, '[]'$  problems with blood clotting too easily  MUSCULOSKEL:  '[]'$  joint pain, '[]'$  joint swelling  GASTROINTEST:  '[]'$  vomiting blood, '[]'$  blood in stool     GENITOURINARY:  '[]'$  burning with urination, '[]'$  blood in urine  PSYCHIATRIC:  '[]'$  history of major depression  INTEGUMENTARY:  '[]'$  rashes, '[x]'$  ulcers  CONSTITUTIONAL:  '[]'$  fever, '[]'$  chills  For VQI Use Only  PRE-ADM LIVING: Home  AMB STATUS: Ambulatory  CAD Sx: None  PRIOR CHF: None  STRESS TEST: [x ] No, '[ ]'$  Normal, '[ ]'$  + ischemia, '[ ]'$  + MI, '[ ]'$  Both   Physical Examination  Filed Vitals:   12/04/14 1305 12/04/14 1307  BP: 136/71 139/64  Pulse: 99   Height: '5\' 5"'$  (1.651 m)   Weight: 184 lb (83.462 kg)   SpO2: 96%    Body mass index is 30.62 kg/(m^2).  General: A&O x 3, WDWN female in NAD  Head: [place]/AT  Neck: Supple  Pulmonary: Sym exp, good air movt, CTAB, no rales, rhonchi, & wheezing  Cardiac: RRR, Nl S1, S2, no Murmurs, rubs or gallops, right carotid bruit  Vascular: palpable radial, femoral and dorsalis pedis pulses bilaterally   Gastrointestinal: soft, NTND, - masses  Musculoskeletal: M/S 5/5 throughout   Neurologic: CN 2-12 grossly intact. Motor intact to extremities bilaterally.  Psychiatric: Judgment intact, Mood & affect appropriate for pt's clinical situation  Dermatologic: See M/S exam for extremity exam, no rashes otherwise noted  Non-Invasive Vascular Imaging  CAROTID DUPLEX (Date: 12/04/2014):   R CCA: minimal to no flow, R ICA  stenosis: <40%,   R VA:  patent and antegrade  L ICA stenosis: 40-59%  L VA:  patent and antegrade  Medical Decision Making  RIANE RUNG is a 59 y.o. female who presents with symptomatic right carotid stenosis as manifested by left sided numbness (including two episodes of left sided numbness during her visit today at the office) and recent episodes of right visual changes.    Will admit patient today and place on IV heparin.   Obtain CT head to evaluate for acute stroke.   Plan for urgent carotid angiogram tomorrow to further evaluation.  The procedure, risks and benefits were discussed at length with patient. A 1% risk of stroke associated  with the procedure was discussed and the patient is willing to proceed.   Virgina Jock, PA-C [facility] Office: [phone] Pager: [phone]  12/04/2014, 1:44 PM  This patient was seen in conjunction with Dr. Oneida Alar   History and exam findings as above. The patient has most likely asymptomatic right carotid stenosis or possible occlusion based on duplex exam. Since the duplex shows common carotid involvement I believe she needs a arch and carotid angiogram prior to considering any intervention. We will admit her to the hospital today for IV heparin while awaiting her carotid angiogram tomorrow. We will then based treatment on the carotid antrum findings. I discussed with the patient today the risks benefits possible, locations and procedure details of arch and carotid angiogram including not limited to bleeding infection possible stroke. We will stop her heparin at midnight. Dr. Bridgett Larsson, my partner, will be doing her arteriogram tomorrow. Head CT will be obtained tonight to make sure she has not had an acute stroke.  [name], MD [facility] Office: [phone] Pager: [phone]

## 2014-12-05 ENCOUNTER — Encounter (HOSPITAL_COMMUNITY): Payer: Self-pay | Admitting: Vascular Surgery

## 2014-12-05 ENCOUNTER — Encounter (HOSPITAL_COMMUNITY)
Admission: AD | Disposition: A | Discharge status: Home / Self Care | Payer: Self-pay | Source: Ambulatory Visit | Attending: Vascular Surgery

## 2014-12-05 ENCOUNTER — Ambulatory Visit (HOSPITAL_COMMUNITY): Admission: RE | Admit: 2014-12-05 | Payer: 59 | Source: Ambulatory Visit | Admitting: Vascular Surgery

## 2014-12-05 DIAGNOSIS — I6521 Occlusion and stenosis of right carotid artery: Secondary | ICD-10-CM

## 2014-12-05 HISTORY — PX: PERIPHERAL VASCULAR CATHETERIZATION: SHX172C

## 2014-12-05 LAB — CBC
HEMATOCRIT: 39.7 % (ref 36.0–46.0)
HEMOGLOBIN: 12.7 g/dL (ref 12.0–15.0)
MCH: 28.1 pg (ref 26.0–34.0)
MCHC: 32 g/dL (ref 30.0–36.0)
MCV: 87.8 fL (ref 78.0–100.0)
Platelets: 194 10*3/uL (ref 150–400)
RBC: 4.52 MIL/uL (ref 3.87–5.11)
RDW: 14.4 % (ref 11.5–15.5)
WBC: 6.8 10*3/uL (ref 4.0–10.5)

## 2014-12-05 LAB — BASIC METABOLIC PANEL
ANION GAP: 8 (ref 5–15)
BUN: 12 mg/dL (ref 6–20)
CHLORIDE: 105 mmol/L (ref 101–111)
CO2: 28 mmol/L (ref 22–32)
Calcium: 9.2 mg/dL (ref 8.9–10.3)
Creatinine, Ser: 0.56 mg/dL (ref 0.44–1.00)
GFR calc Af Amer: >60 mL/min (ref >60)
GFR calc non Af Amer: >60 mL/min (ref >60)
GLUCOSE: 98 mg/dL (ref 65–99)
POTASSIUM: 4.1 mmol/L (ref 3.5–5.1)
Sodium: 141 mmol/L (ref 135–145)

## 2014-12-05 LAB — HEPARIN LEVEL (UNFRACTIONATED): HEPARIN UNFRACTIONATED: 0.48 [IU]/mL (ref 0.30–0.70)

## 2014-12-05 SURGERY — AORTIC ARCH ANGIOGRAPHY

## 2014-12-05 MED ORDER — OXYCODONE-ACETAMINOPHEN 5-325 MG PO TABS
1.0000 | ORAL_TABLET | Freq: Four times a day (QID) | ORAL | Status: DC | PRN
  Administered 2014-12-05 – 2014-12-06 (×2): 1 via ORAL
  Filled 2014-12-05 (×2): qty 1

## 2014-12-05 MED ORDER — SODIUM CHLORIDE 0.9 % IV SOLN
1.0000 mL/kg/h | INTRAVENOUS | Status: AC
  Administered 2014-12-05: 1 mL/kg/h via INTRAVENOUS

## 2014-12-05 MED ORDER — MORPHINE SULFATE (PF) 2 MG/ML IV SOLN: 1.0000 mg | INTRAVENOUS | Status: DC | PRN

## 2014-12-05 MED ORDER — LIDOCAINE HCL (PF) 1 % IJ SOLN
INTRAMUSCULAR | Status: AC
  Filled 2014-12-05: qty 30

## 2014-12-05 MED ORDER — ONDANSETRON HCL 4 MG/2ML IJ SOLN: 4.0000 mg | Freq: Four times a day (QID) | INTRAMUSCULAR | Status: DC | PRN

## 2014-12-05 MED ORDER — ACETAMINOPHEN 325 MG PO TABS: 650.0000 mg | ORAL_TABLET | ORAL | Status: DC | PRN

## 2014-12-05 MED ORDER — HEPARIN (PORCINE) IN NACL 2-0.9 UNIT/ML-% IJ SOLN
INTRAMUSCULAR | Status: AC
  Filled 2014-12-05: qty 1000

## 2014-12-05 MED ORDER — HEPARIN (PORCINE) IN NACL 100-0.45 UNIT/ML-% IJ SOLN
1250.0000 [IU]/h | INTRAMUSCULAR | Status: DC
  Administered 2014-12-06: 1250 [IU]/h via INTRAVENOUS
  Filled 2014-12-05 (×3): qty 250

## 2014-12-05 MED ORDER — LIDOCAINE HCL (PF) 1 % IJ SOLN
INTRAMUSCULAR | Status: DC | PRN
  Administered 2014-12-05: 08:00:00

## 2014-12-05 MED ORDER — IODIXANOL 320 MG/ML IV SOLN
INTRAVENOUS | Status: DC | PRN
  Administered 2014-12-05: 75 mL via INTRAVENOUS

## 2014-12-05 SURGICAL SUPPLY — 12 items
CATH ANGIO 5F BER2 100CM (CATHETERS) ×3 IMPLANT
CATH ANGIO 5F PIGTAIL 100CM (CATHETERS) ×3 IMPLANT
COVER PRB 48X5XTLSCP FOLD TPE (BAG) ×1 IMPLANT
COVER PROBE 5X48 (BAG) ×2
KIT MICROINTRODUCER STIFF 5F (SHEATH) ×6 IMPLANT
KIT PV (KITS) ×3 IMPLANT
SHEATH PINNACLE 5F 10CM (SHEATH) ×3 IMPLANT
SYR MEDRAD MARK V 150ML (SYRINGE) ×3 IMPLANT
TRANSDUCER W/STOPCOCK (MISCELLANEOUS) ×3 IMPLANT
TRAY PV CATH (CUSTOM PROCEDURE TRAY) ×3 IMPLANT
WIRE BENTSON .035X145CM (WIRE) ×3 IMPLANT
WIRE TORQFLEX AUST .018X40CM (WIRE) ×3 IMPLANT

## 2014-12-05 NOTE — Care Management Note (Addendum)
Case Management Note  Patient Details  Name: Emily Rocha MRN: [id number] Date of Birth: 12-30-1955  Subjective/Objective: Pt admitted for evaluation of bilateral carotid stenosis. Plan for CEA 12-05-14.                     Action/Plan: No needs identified at this time for disposition. Will continue to monitor.    Expected Discharge Date:                  Expected Discharge Plan:  Home/Self Care  In-House Referral:     Discharge planning Services  CM Consult  Post Acute Care Choice:    Choice offered to:     DME Arranged:    DME Agency:     HH Arranged:    HH Agency:     Status of Service:  In process, will continue to follow  Medicare Important Message Given:    Date Medicare IM Given:    Medicare IM give by:    Date Additional Medicare IM Given:    Additional Medicare Important Message give by:     If discussed at [place] of Stay Meetings, dates discussed:    Additional Comments:  Bethena Roys, RN 12/05/2014, 4:31 PM

## 2014-12-05 NOTE — H&P (View-Only) (Signed)
New Carotid Patient  Referred by:  Glenda Chroman, MD [place], [place] [address]  Reason for referral: symptomatic bilateral carotid stenosis  History of Present Illness  Emily Rocha is a 59 y.o. (Jan 17, 1956) female who presents for evaluation of bilateral carotid stenosis. Three years ago, she had an isolated episode of numbness affecting her left posterior neck, left arm and left leg. This resolved within a few minutes. She went to see a physician at that time who recommended a carotid duplex. The patient declined the test due to financial reasons. She was advised to start on a baby aspirin and to report to the hospital if a similar episode occurred.   About 5 weeks ago, she started having similar symptoms of left sided numbness that would resolve after a few minutes. She denies any weakness. These episodes have occurred intermittently during the past 5 weeks. She has seen her PCP who referred her here for further evaluation. Following her carotid duplex today in the office, she reports left sided numbness. Additionally, she reported an episode of numbness while being examined by Dr. Oneida Alar.   The patient has also had two episodes of temporary right eye blindness described as a "cloud" covering her right eye. The most recent episodes being yesterday. These episodes would last for approximately a minute. She denies expressive or receptive aphasia.   The patient does not smoke cigarettes but has used a vaporizer for the past 5 years. She says she is trying to cut down. She has no history of hypertension, hyperlipidemia or diabetes. She does have COPD. 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   She denies any history of claudication or rest pain. She denies any cardiac history or prior history of stroke. She currently takes a baby aspirin daily.   Past Medical History    Diagnosis Date  . COPD (chronic obstructive pulmonary disease) ([place])   . Shortness of breath dyspnea   . GERD (gastroesophageal reflux disease)     Past Surgical History  Procedure Laterality Date  . Cholecystectomy    . Colonoscopy N/A 10/03/2014    Procedure: COLONOSCOPY;  Surgeon: Rogene Houston, MD;  Location: [facility];  Service: Endoscopy;  Laterality: N/A;  730    Social History   Social History  . Marital Status: Divorced    Spouse Name: N/A  . Number of Children: N/A  . Years of Education: N/A   Occupational History  . Not on file.   Social History Main Topics  . Smoking status: Former Smoker -- 2.00 packs/day for 40 years    Quit date: 10/03/2010  . Smokeless tobacco: Not on file  . Alcohol Use: No  . Drug Use: No  . Sexual Activity: Not on file   Other Topics Concern  . Not on file   Social History Narrative    Family History  Problem Relation Age of Onset  . Heart attack Mother   . Cancer Mother   . Diabetes Mother   . Heart disease Mother   . Heart attack Father   . Heart disease Father    Current Outpatient Prescriptions on File Prior to Visit  Medication Sig Dispense Refill  . ALPRAZolam (XANAX) 0.5 MG tablet Take 0.5 mg by mouth as needed for anxiety (take one (1) TABLET one hour po before test).    Marland Kitchen aspirin  EC 81 MG tablet Take 81 mg by mouth daily.    . pantoprazole (PROTONIX) 40 MG tablet Take 1 tablet by mouth daily.  0  . SYMBICORT 160-4.5 MCG/ACT inhaler Inhale 2 puffs into the lungs daily.  0  . VENTOLIN HFA 108 (90 BASE) MCG/ACT inhaler Inhale 2 puffs into the lungs every 6 (six) hours as needed.  0  . Cholecalciferol (VITAMIN D PO) Take 1 tablet by mouth daily.     No current facility-administered medications on file prior to visit.    No Known Allergies  REVIEW OF SYSTEMS:  (Positives checked otherwise negative)  CARDIOVASCULAR:  '[]'$  chest pain, '[]'$  chest pressure, '[]'$  palpitations, '[]'$  shortness of breath when laying flat,  '[]'$  shortness of breath with exertion,  '[]'$  pain in feet when walking, '[]'$  pain in feet when laying flat, '[]'$  history of blood clot in veins (DVT), '[]'$  history of phlebitis, '[]'$  swelling in legs, '[]'$  varicose veins  PULMONARY:  '[]'$  productive cough, '[]'$  asthma, '[]'$  wheezing  NEUROLOGIC:  '[]'$  weakness in arms or legs, '[x]'$  numbness in arms or legs, '[]'$  difficulty speaking or slurred speech, '[]'$  temporary loss of vision in one eye, '[]'$  dizziness  HEMATOLOGIC:  '[]'$  bleeding problems, '[]'$  problems with blood clotting too easily  MUSCULOSKEL:  '[]'$  joint pain, '[]'$  joint swelling  GASTROINTEST:  '[]'$  vomiting blood, '[]'$  blood in stool     GENITOURINARY:  '[]'$  burning with urination, '[]'$  blood in urine  PSYCHIATRIC:  '[]'$  history of major depression  INTEGUMENTARY:  '[]'$  rashes, '[x]'$  ulcers  CONSTITUTIONAL:  '[]'$  fever, '[]'$  chills  For VQI Use Only  PRE-ADM LIVING: Home  AMB STATUS: Ambulatory  CAD Sx: None  PRIOR CHF: None  STRESS TEST: [x ] No, '[ ]'$  Normal, '[ ]'$  + ischemia, '[ ]'$  + MI, '[ ]'$  Both   Physical Examination  Filed Vitals:   12/04/14 1305 12/04/14 1307  BP: 136/71 139/64  Pulse: 99   Height: '5\' 5"'$  (1.651 m)   Weight: 184 lb (83.462 kg)   SpO2: 96%    Body mass index is 30.62 kg/(m^2).  General: A&O x 3, WDWN female in NAD  Head: [place]/AT  Neck: Supple  Pulmonary: Sym exp, good air movt, CTAB, no rales, rhonchi, & wheezing  Cardiac: RRR, Nl S1, S2, no Murmurs, rubs or gallops, right carotid bruit  Vascular: palpable radial, femoral and dorsalis pedis pulses bilaterally   Gastrointestinal: soft, NTND, - masses  Musculoskeletal: M/S 5/5 throughout   Neurologic: CN 2-12 grossly intact. Motor intact to extremities bilaterally.  Psychiatric: Judgment intact, Mood & affect appropriate for pt's clinical situation  Dermatologic: See M/S exam for extremity exam, no rashes otherwise noted  Non-Invasive Vascular Imaging  CAROTID DUPLEX (Date: 12/04/2014):   R CCA: minimal to no flow, R ICA  stenosis: <40%,   R VA:  patent and antegrade  L ICA stenosis: 40-59%  L VA:  patent and antegrade  Medical Decision Making  Emily Rocha is a 59 y.o. female who presents with symptomatic right carotid stenosis as manifested by left sided numbness (including two episodes of left sided numbness during her visit today at the office) and recent episodes of right visual changes.    Will admit patient today and place on IV heparin.   Obtain CT head to evaluate for acute stroke.   Plan for urgent carotid angiogram tomorrow to further evaluation.  The procedure, risks and benefits were discussed at length with patient. A 1% risk of stroke associated  with the procedure was discussed and the patient is willing to proceed.   Virgina Jock, PA-C [facility] [place] Office: [phone] Pager: [phone]  12/04/2014, 1:44 PM  This patient was seen in conjunction with Dr. Oneida Alar   History and exam findings as above. The patient has most likely asymptomatic right carotid stenosis or possible occlusion based on duplex exam. Since the duplex shows common carotid involvement I believe she needs a arch and carotid angiogram prior to considering any intervention. We will admit her to the hospital today for IV heparin while awaiting her carotid angiogram tomorrow. We will then based treatment on the carotid antrum findings. I discussed with the patient today the risks benefits possible, locations and procedure details of arch and carotid angiogram including not limited to bleeding infection possible stroke. We will stop her heparin at midnight. Dr. Bridgett Larsson, my partner, will be doing her arteriogram tomorrow. Head CT will be obtained tonight to make sure she has not had an acute stroke.  [name], MD [facility] [place] Office: [phone] Pager: [phone]

## 2014-12-05 NOTE — Interval H&P Note (Signed)
     History and Physical Update   The patient was interviewed and re-examined.  The patient's previous History and Physical has been reviewed and is unchanged from Dr. Nona Dell consult.  There is no change in the plan of care: Arch aortogram and Bilateral carotid angiogram.  I discussed with the patient the nature of angiographic procedures, especially the limited patencies of any endovascular intervention.  The patient is aware of that the risks of an angiographic procedure include but are not limited to: bleeding, infection, access site complications, renal failure, embolization, rupture of vessel, dissection, possible need for emergent surgical intervention, possible need for surgical procedures to treat the patient's pathology, anaphylactic reaction to contrast, and stroke and death.  The patient is aware of the risks and agrees to proceed.   Adele Barthel, MD [facility] [place] Office: [phone] Pager: [phone]  12/05/2014, 7:18 AM

## 2014-12-05 NOTE — Progress Notes (Signed)
Pt had 8/10 pain and BP 122/64. Too early to give percocet so morphine IV offered to patient. Patient given 2 mg IV morphine but 10 minutes later patient reported feeling dizzy, sweaty, and seeing black spots. Rechecked BP immediately and BP read 58/44 and rechecked repeatedly on both arms on automatic BP cuff. Manual BP taken and got 122/70. Patient's symptoms resolved in about 20 minutes completely but advised the patient to stay in bed unless staff present to help and placed bed alarm for a few hours to ensure patient did not get up without assistance. MD notified and gave telephone read-back orders to reduce all pain medication.

## 2014-12-05 NOTE — Progress Notes (Signed)
Site area: left groin fa sheath Site Prior to Removal:  Level  0 Pressure Applied For:  20 minutes Manual:   yes Patient Status During Pull:  stable Post Pull Site:  Level  0  Post Pull Instructions Given:  yes Post Pull Pulses Present: yes Dressing Applied:  tegaderm Bedrest begins @  0915 Comments: no neuro deficits

## 2014-12-05 NOTE — Progress Notes (Signed)
ANTICOAGULATION CONSULT NOTE - Follow Up Consult  Pharmacy Consult:  Heparin Indication:  Bilateral carotid stenosis  No Known Allergies  Patient Measurements: Height: '5\' 4"'$  (162.6 cm) Weight: 184 lb (83.462 kg) IBW/kg (Calculated) : 54.7 Heparin Dosing Weight: 73 kg  Vital Signs: Temp: 97.4 F (36.3 C) (10/20 0400) Temp Source: Oral (10/20 0400) BP: 130/71 mmHg (10/20 0948) Pulse Rate: 89 (10/20 0948)  Labs:  Recent Labs  12/04/14 1709 12/05/14 0358  HGB 13.2 12.7  HCT 41.0 39.7  PLT 213 194  LABPROT 13.3  --   INR 0.99  --   CREATININE 0.80 0.56    Estimated Creatinine Clearance: 77.2 mL/min (by C-G formula based on Cr of 0.56).     Assessment: [name] with bilateral carotid stenosis now s/p angiogram and to resume heparin in 8 hours.  No bleeding reported.   Goal of Therapy:  Heparin level 0.3-0.7 units/ml Monitor platelets by anticoagulation protocol: Yes    Plan:  - At 1800, resume heparin gtt at 1250 units/hr, no bolus - Check 6 hr HL - Daily HL / CBC    Emily Rocha D. Mina Marble, PharmD, BCPS Pager:  [phone] 12/05/2014, 10:05 AM

## 2014-12-05 NOTE — Progress Notes (Addendum)
UR Completed Justyn Boyson Graves-Bigelow, RN,BSN [phone]  

## 2014-12-05 NOTE — Progress Notes (Signed)
[facility] [place]  Subjective  - no further episodes last night   Objective 103/82 87 97.4 F (36.3 C) (Oral) 26 98%  Intake/Output Summary (Last 24 hours) at 12/05/14 0927 Last data filed at 12/05/14 0400  Gross per 24 hour  Intake      0 ml  Output   1450 ml  Net  -1450 ml   Assessment/Planning: Arteriogram reviewed.  Pt has severe calcified length > 90% plaque right CEA most likely cause of her tia symptoms Will plan for right CEA tomorrow.  Pt has low bifurcation.  Plaque seems to end just above the clavicle.  Outside chance she would need partial sternotomy but doubt this.  [name] 12/05/2014 9:27 AM --  Laboratory Lab Results:  Recent Labs  12/04/14 1709 12/05/14 0358  WBC 6.0 6.8  HGB 13.2 12.7  HCT 41.0 39.7  PLT 213 194   BMET  Recent Labs  12/04/14 1709 12/05/14 0358  NA 143 141  K 3.8 4.1  CL 103 105  CO2 30 28  GLUCOSE 160* 98  BUN 13 12  CREATININE 0.80 0.56  CALCIUM 9.9 9.2    COAG Lab Results  Component Value Date   INR 0.99 12/04/2014   No results found for: PTT

## 2014-12-05 NOTE — Progress Notes (Signed)
Neuro assessment: PERRL. Speech clear and appropriate. Upper and lower extremities equal bilaterally in strength. Tongue midline. Smile symmetrical.

## 2014-12-06 ENCOUNTER — Telehealth: Payer: Self-pay | Admitting: Vascular Surgery

## 2014-12-06 ENCOUNTER — Encounter (HOSPITAL_COMMUNITY): Payer: Self-pay | Admitting: Certified Registered Nurse Anesthetist

## 2014-12-06 ENCOUNTER — Inpatient Hospital Stay (HOSPITAL_COMMUNITY): Payer: 59 | Admitting: Certified Registered Nurse Anesthetist

## 2014-12-06 ENCOUNTER — Encounter (HOSPITAL_COMMUNITY)
Admission: AD | Disposition: A | Discharge status: Home / Self Care | Payer: Self-pay | Source: Ambulatory Visit | Attending: Vascular Surgery

## 2014-12-06 DIAGNOSIS — I6529 Occlusion and stenosis of unspecified carotid artery: Secondary | ICD-10-CM | POA: Diagnosis present

## 2014-12-06 HISTORY — PX: ENDARTERECTOMY: SHX5162

## 2014-12-06 LAB — CBC
HEMATOCRIT: 36.9 % (ref 36.0–46.0)
HEMOGLOBIN: 11.7 g/dL — AB (ref 12.0–15.0)
MCH: 27.9 pg (ref 26.0–34.0)
MCHC: 31.7 g/dL (ref 30.0–36.0)
MCV: 87.9 fL (ref 78.0–100.0)
Platelets: 208 10*3/uL (ref 150–400)
RBC: 4.2 MIL/uL (ref 3.87–5.11)
RDW: 14.3 % (ref 11.5–15.5)
WBC: 5.1 10*3/uL (ref 4.0–10.5)

## 2014-12-06 LAB — HEPARIN LEVEL (UNFRACTIONATED): Heparin Unfractionated: 0.58 IU/mL (ref 0.30–0.70)

## 2014-12-06 LAB — ABO/RH: ABO/RH(D): A NEG

## 2014-12-06 LAB — BASIC METABOLIC PANEL
ANION GAP: 11 (ref 5–15)
BUN: 10 mg/dL (ref 6–20)
CALCIUM: 9 mg/dL (ref 8.9–10.3)
CO2: 28 mmol/L (ref 22–32)
Chloride: 104 mmol/L (ref 101–111)
Creatinine, Ser: 0.56 mg/dL (ref 0.44–1.00)
Glucose, Bld: 87 mg/dL (ref 65–99)
POTASSIUM: 4 mmol/L (ref 3.5–5.1)
Sodium: 143 mmol/L (ref 135–145)

## 2014-12-06 LAB — TYPE AND SCREEN
ABO/RH(D): A NEG
ANTIBODY SCREEN: NEGATIVE

## 2014-12-06 LAB — SURGICAL PCR SCREEN
MRSA, PCR: NEGATIVE
STAPHYLOCOCCUS AUREUS: POSITIVE — AB

## 2014-12-06 SURGERY — ENDARTERECTOMY, CAROTID
Anesthesia: General | Site: Neck | Laterality: Right

## 2014-12-06 MED ORDER — ACETAMINOPHEN 650 MG RE SUPP: 325.0000 mg | RECTAL | Status: DC | PRN

## 2014-12-06 MED ORDER — OXYCODONE HCL 5 MG PO TABS
5.0000 mg | ORAL_TABLET | ORAL | Status: DC | PRN
  Administered 2014-12-06 – 2014-12-07 (×3): 10 mg via ORAL
  Filled 2014-12-06 (×3): qty 2

## 2014-12-06 MED ORDER — ASPIRIN EC 81 MG PO TBEC
81.0000 mg | DELAYED_RELEASE_TABLET | Freq: Every day | ORAL | Status: DC
  Administered 2014-12-07: 81 mg via ORAL
  Filled 2014-12-06: qty 1

## 2014-12-06 MED ORDER — LABETALOL HCL 5 MG/ML IV SOLN
INTRAVENOUS | Status: DC | PRN
  Administered 2014-12-06: 5 mg via INTRAVENOUS

## 2014-12-06 MED ORDER — PROMETHAZINE HCL 25 MG/ML IJ SOLN: 6.2500 mg | INTRAMUSCULAR | Status: DC | PRN

## 2014-12-06 MED ORDER — 0.9 % SODIUM CHLORIDE (POUR BTL) OPTIME
TOPICAL | Status: DC | PRN
  Administered 2014-12-06: 1000 mL

## 2014-12-06 MED ORDER — SUGAMMADEX SODIUM 200 MG/2ML IV SOLN
INTRAVENOUS | Status: AC
  Filled 2014-12-06: qty 2

## 2014-12-06 MED ORDER — LACTATED RINGERS IV SOLN
INTRAVENOUS | Status: DC
  Administered 2014-12-06: 10:00:00 via INTRAVENOUS

## 2014-12-06 MED ORDER — LIDOCAINE HCL (PF) 1 % IJ SOLN
INTRAMUSCULAR | Status: AC
  Filled 2014-12-06: qty 30

## 2014-12-06 MED ORDER — SUGAMMADEX SODIUM 200 MG/2ML IV SOLN
INTRAVENOUS | Status: DC | PRN
  Administered 2014-12-06: 200 mg via INTRAVENOUS

## 2014-12-06 MED ORDER — ENOXAPARIN SODIUM 30 MG/0.3ML ~~LOC~~ SOLN
30.0000 mg | SUBCUTANEOUS | Status: DC
  Filled 2014-12-06: qty 0.3

## 2014-12-06 MED ORDER — FENTANYL CITRATE (PF) 250 MCG/5ML IJ SOLN
INTRAMUSCULAR | Status: AC
  Filled 2014-12-06: qty 5

## 2014-12-06 MED ORDER — METOPROLOL TARTRATE 1 MG/ML IV SOLN
INTRAVENOUS | Status: AC
  Filled 2014-12-06: qty 10

## 2014-12-06 MED ORDER — PHENYLEPHRINE HCL 10 MG/ML IJ SOLN
10.0000 mg | INTRAMUSCULAR | Status: DC | PRN
  Administered 2014-12-06: 10 ug/min via INTRAVENOUS

## 2014-12-06 MED ORDER — ONDANSETRON HCL 4 MG/2ML IJ SOLN
4.0000 mg | Freq: Four times a day (QID) | INTRAMUSCULAR | Status: DC | PRN
  Administered 2014-12-06 – 2014-12-07 (×2): 4 mg via INTRAVENOUS
  Filled 2014-12-06 (×2): qty 2

## 2014-12-06 MED ORDER — ARTIFICIAL TEARS OP OINT
TOPICAL_OINTMENT | OPHTHALMIC | Status: DC | PRN
  Administered 2014-12-06: 1 via OPHTHALMIC

## 2014-12-06 MED ORDER — HYDROMORPHONE HCL 1 MG/ML IJ SOLN
0.2000 mg | INTRAMUSCULAR | Status: DC | PRN
  Administered 2014-12-06 – 2014-12-07 (×3): 0.2 mg via INTRAVENOUS
  Filled 2014-12-06 (×2): qty 1

## 2014-12-06 MED ORDER — PROPOFOL 10 MG/ML IV BOLUS
INTRAVENOUS | Status: DC | PRN
  Administered 2014-12-06: 80 mg via INTRAVENOUS
  Administered 2014-12-06: 120 mg via INTRAVENOUS

## 2014-12-06 MED ORDER — CHLORHEXIDINE GLUCONATE CLOTH 2 % EX PADS
6.0000 | MEDICATED_PAD | Freq: Every day | CUTANEOUS | Status: DC
  Administered 2014-12-07: 6 via TOPICAL

## 2014-12-06 MED ORDER — PROTAMINE SULFATE 10 MG/ML IV SOLN
INTRAVENOUS | Status: AC
Start: 2014-12-06 — End: 2014-12-06
  Filled 2014-12-06: qty 5

## 2014-12-06 MED ORDER — FENTANYL CITRATE (PF) 100 MCG/2ML IJ SOLN
INTRAMUSCULAR | Status: DC | PRN
  Administered 2014-12-06 (×2): 50 ug via INTRAVENOUS
  Administered 2014-12-06: 100 ug via INTRAVENOUS
  Administered 2014-12-06: 50 ug via INTRAVENOUS

## 2014-12-06 MED ORDER — MUPIROCIN 2 % EX OINT
1.0000 "application " | TOPICAL_OINTMENT | Freq: Two times a day (BID) | CUTANEOUS | Status: DC
  Administered 2014-12-06 – 2014-12-07 (×2): 1 via NASAL
  Filled 2014-12-06: qty 22

## 2014-12-06 MED ORDER — NITROGLYCERIN 0.2 MG/ML ON CALL CATH LAB
INTRAVENOUS | Status: DC | PRN
  Administered 2014-12-06: 80 ug via INTRAVENOUS
  Administered 2014-12-06: 40 ug via INTRAVENOUS
  Administered 2014-12-06: 80 ug via INTRAVENOUS

## 2014-12-06 MED ORDER — MEPERIDINE HCL 25 MG/ML IJ SOLN: 6.2500 mg | INTRAMUSCULAR | Status: DC | PRN

## 2014-12-06 MED ORDER — MIDAZOLAM HCL 2 MG/2ML IJ SOLN
INTRAMUSCULAR | Status: AC
  Filled 2014-12-06: qty 4

## 2014-12-06 MED ORDER — HEPARIN SODIUM (PORCINE) 5000 UNIT/ML IJ SOLN
INTRAMUSCULAR | Status: DC | PRN
  Administered 2014-12-06: 500 mL

## 2014-12-06 MED ORDER — ROCURONIUM BROMIDE 100 MG/10ML IV SOLN
INTRAVENOUS | Status: DC | PRN
  Administered 2014-12-06: 50 mg via INTRAVENOUS

## 2014-12-06 MED ORDER — LIDOCAINE HCL (CARDIAC) 20 MG/ML IV SOLN
INTRAVENOUS | Status: DC | PRN
  Administered 2014-12-06: 100 mg via INTRAVENOUS

## 2014-12-06 MED ORDER — LABETALOL HCL 5 MG/ML IV SOLN
INTRAVENOUS | Status: AC
  Filled 2014-12-06: qty 4

## 2014-12-06 MED ORDER — SODIUM CHLORIDE 0.45 % IV SOLN
INTRAVENOUS | Status: DC
  Administered 2014-12-06: 18:00:00 via INTRAVENOUS

## 2014-12-06 MED ORDER — MIDAZOLAM HCL 5 MG/5ML IJ SOLN
INTRAMUSCULAR | Status: DC | PRN
  Administered 2014-12-06: 2 mg via INTRAVENOUS

## 2014-12-06 MED ORDER — HYDROMORPHONE HCL 1 MG/ML IJ SOLN
INTRAMUSCULAR | Status: AC
  Filled 2014-12-06: qty 1

## 2014-12-06 MED ORDER — SODIUM CHLORIDE 0.9 % IV SOLN
500.0000 mL | Freq: Once | INTRAVENOUS | Status: AC | PRN
  Administered 2014-12-06 (×2): 500 mL via INTRAVENOUS

## 2014-12-06 MED ORDER — HEPARIN SODIUM (PORCINE) 1000 UNIT/ML IJ SOLN
INTRAMUSCULAR | Status: DC | PRN
  Administered 2014-12-06 (×2): 8000 [IU] via INTRAVENOUS

## 2014-12-06 MED ORDER — DEXTROSE 5 % IV SOLN
1.5000 g | INTRAVENOUS | Status: AC
  Administered 2014-12-06: 1.5 g via INTRAVENOUS
  Filled 2014-12-06: qty 1.5

## 2014-12-06 MED ORDER — MAGNESIUM HYDROXIDE 400 MG/5ML PO SUSP: 30.0000 mL | Freq: Every day | ORAL | Status: DC | PRN

## 2014-12-06 MED ORDER — ACETAMINOPHEN 325 MG PO TABS: 325.0000 mg | ORAL_TABLET | ORAL | Status: DC | PRN

## 2014-12-06 MED ORDER — ONDANSETRON HCL 4 MG/2ML IJ SOLN
INTRAMUSCULAR | Status: DC | PRN
  Administered 2014-12-06: 4 mg via INTRAVENOUS

## 2014-12-06 MED ORDER — BISACODYL 10 MG RE SUPP: 10.0000 mg | Freq: Every day | RECTAL | Status: DC | PRN

## 2014-12-06 MED ORDER — POTASSIUM CHLORIDE CRYS ER 20 MEQ PO TBCR: 20.0000 meq | EXTENDED_RELEASE_TABLET | Freq: Every day | ORAL | Status: DC | PRN

## 2014-12-06 MED ORDER — PROTAMINE SULFATE 10 MG/ML IV SOLN
INTRAVENOUS | Status: DC | PRN
  Administered 2014-12-06: 80 mg via INTRAVENOUS

## 2014-12-06 MED ORDER — EPHEDRINE SULFATE 50 MG/ML IJ SOLN
INTRAMUSCULAR | Status: DC | PRN
  Administered 2014-12-06 (×3): 5 mg via INTRAVENOUS

## 2014-12-06 MED ORDER — DEXAMETHASONE SODIUM PHOSPHATE 4 MG/ML IJ SOLN
INTRAMUSCULAR | Status: DC | PRN
  Administered 2014-12-06: 8 mg via INTRAVENOUS

## 2014-12-06 MED ORDER — SODIUM CHLORIDE 0.9 % IV SOLN
0.0125 ug/kg/min | INTRAVENOUS | Status: AC
  Administered 2014-12-06: .1 ug/kg/min via INTRAVENOUS
  Filled 2014-12-06: qty 2000

## 2014-12-06 MED ORDER — MORPHINE SULFATE (PF) 2 MG/ML IV SOLN: 2.0000 mg | INTRAVENOUS | Status: DC | PRN

## 2014-12-06 MED ORDER — THROMBIN 20000 UNITS EX SOLR
CUTANEOUS | Status: AC
  Filled 2014-12-06: qty 20000

## 2014-12-06 MED ORDER — LACTATED RINGERS IV SOLN
INTRAVENOUS | Status: DC | PRN
  Administered 2014-12-06 (×2): via INTRAVENOUS

## 2014-12-06 MED ORDER — CEFUROXIME SODIUM 1.5 G IJ SOLR
1.5000 g | Freq: Two times a day (BID) | INTRAMUSCULAR | Status: AC
  Administered 2014-12-06 – 2014-12-07 (×2): 1.5 g via INTRAVENOUS
  Filled 2014-12-06 (×3): qty 1.5

## 2014-12-06 MED ORDER — DEXTROSE 5 % IV SOLN: 10.0000 mg | INTRAVENOUS | Status: DC | PRN

## 2014-12-06 MED ORDER — DOCUSATE SODIUM 100 MG PO CAPS
100.0000 mg | ORAL_CAPSULE | Freq: Every day | ORAL | Status: DC
  Administered 2014-12-07: 100 mg via ORAL
  Filled 2014-12-06: qty 1

## 2014-12-06 MED ORDER — PROTAMINE SULFATE 10 MG/ML IV SOLN
INTRAVENOUS | Status: AC
  Filled 2014-12-06: qty 5

## 2014-12-06 MED ORDER — METOPROLOL TARTRATE 1 MG/ML IV SOLN
INTRAVENOUS | Status: DC | PRN
  Administered 2014-12-06: 2.5 mg via INTRAVENOUS

## 2014-12-06 MED ORDER — HYDROMORPHONE HCL 1 MG/ML IJ SOLN
0.2500 mg | INTRAMUSCULAR | Status: DC | PRN
  Administered 2014-12-06 (×2): 0.5 mg via INTRAVENOUS

## 2014-12-06 SURGICAL SUPPLY — 52 items
CANISTER SUCTION 2500CC (MISCELLANEOUS) ×2 IMPLANT
CANNULA VESSEL 3MM 2 BLNT TIP (CANNULA) ×2 IMPLANT
CATH ROBINSON RED A/P 18FR (CATHETERS) ×2 IMPLANT
CLIP TI MEDIUM 6 (CLIP) ×2 IMPLANT
CLIP TI WIDE RED SMALL 6 (CLIP) ×2 IMPLANT
CRADLE DONUT ADULT HEAD (MISCELLANEOUS) ×2 IMPLANT
DECANTER SPIKE VIAL GLASS SM (MISCELLANEOUS) IMPLANT
DRAIN HEMOVAC 1/8 X 5 (WOUND CARE) IMPLANT
ELECT REM PT RETURN 9FT ADLT (ELECTROSURGICAL) ×2
ELECTRODE REM PT RTRN 9FT ADLT (ELECTROSURGICAL) ×1 IMPLANT
EVACUATOR SILICONE 100CC (DRAIN) IMPLANT
GAUZE SPONGE 4X4 12PLY STRL (GAUZE/BANDAGES/DRESSINGS) ×2 IMPLANT
GEL ULTRASOUND 20GR AQUASONIC (MISCELLANEOUS) IMPLANT
GLOVE BIO SURGEON STRL SZ 6.5 (GLOVE) ×10 IMPLANT
GLOVE BIO SURGEON STRL SZ7.5 (GLOVE) ×2 IMPLANT
GLOVE BIOGEL PI IND STRL 6.5 (GLOVE) ×3 IMPLANT
GLOVE BIOGEL PI IND STRL 7.0 (GLOVE) ×1 IMPLANT
GLOVE BIOGEL PI INDICATOR 6.5 (GLOVE) ×3
GLOVE BIOGEL PI INDICATOR 7.0 (GLOVE) ×1
GLOVE ECLIPSE 6.5 STRL STRAW (GLOVE) ×2 IMPLANT
GLOVE ECLIPSE 7.5 STRL STRAW (GLOVE) ×4 IMPLANT
GLOVE SS BIOGEL STRL SZ 6.5 (GLOVE) ×1 IMPLANT
GLOVE SUPERSENSE BIOGEL SZ 6.5 (GLOVE) ×1
GLOVE SURG SS PI 7.5 STRL IVOR (GLOVE) ×2 IMPLANT
GOWN EXTRA PROTECTION XL (GOWNS) ×2 IMPLANT
GOWN STRL REUS W/ TWL LRG LVL3 (GOWN DISPOSABLE) ×3 IMPLANT
GOWN STRL REUS W/TWL LRG LVL3 (GOWN DISPOSABLE) ×3
KIT BASIN OR (CUSTOM PROCEDURE TRAY) ×2 IMPLANT
KIT ROOM TURNOVER OR (KITS) ×2 IMPLANT
KIT SHUNT ARGYLE CAROTID ART 6 (VASCULAR PRODUCTS) ×2 IMPLANT
LIQUID BAND (GAUZE/BANDAGES/DRESSINGS) ×2 IMPLANT
LOOP VESSEL MINI RED (MISCELLANEOUS) IMPLANT
NEEDLE HYPO 25GX1X1/2 BEV (NEEDLE) IMPLANT
NS IRRIG 1000ML POUR BTL (IV SOLUTION) ×4 IMPLANT
PACK CAROTID (CUSTOM PROCEDURE TRAY) ×2 IMPLANT
PAD ARMBOARD 7.5X6 YLW CONV (MISCELLANEOUS) ×4 IMPLANT
PATCH HEMASHIELD 8X150 (Vascular Products) ×2 IMPLANT
SHUNT CAROTID BYPASS 10 (VASCULAR PRODUCTS) IMPLANT
SHUNT CAROTID BYPASS 12FRX15.5 (VASCULAR PRODUCTS) IMPLANT
SPONGE INTESTINAL PEANUT (DISPOSABLE) ×2 IMPLANT
SPONGE SURGIFOAM ABS GEL 100 (HEMOSTASIS) IMPLANT
SUT ETHILON 3 0 PS 1 (SUTURE) IMPLANT
SUT PROLENE 6 0 CC (SUTURE) ×4 IMPLANT
SUT PROLENE 7 0 BV 1 (SUTURE) IMPLANT
SUT PROLENE 7 0 BV1 MDA (SUTURE) ×2 IMPLANT
SUT SILK 3 0 TIES 17X18 (SUTURE)
SUT SILK 3-0 18XBRD TIE BLK (SUTURE) IMPLANT
SUT VIC AB 3-0 SH 27 (SUTURE) ×1
SUT VIC AB 3-0 SH 27X BRD (SUTURE) ×1 IMPLANT
SUT VICRYL 4-0 PS2 18IN ABS (SUTURE) ×2 IMPLANT
SYR CONTROL 10ML LL (SYRINGE) IMPLANT
WATER STERILE IRR 1000ML POUR (IV SOLUTION) ×2 IMPLANT

## 2014-12-06 NOTE — Progress Notes (Signed)
  [facility] Day [facility] Note  Subjective:  Patient seen in PACU. No complaints other than a dry mouth.   Filed Vitals:   12/06/14 1524  BP: 91/50  Pulse: 83  Temp:   Resp: 24    Incisions:  Right neck incision clean and intact. No hematoma.  Extremities: Tongue midline. Smile symmetric. Moving all extremities equally. 5/5 strength upper and lower extremities. No numbness.   Assessment/Plan:  This is a 59 y.o. female who is s/p right CEA.   Neuro exam intact.  Neck without hematoma. Requiring second 500 cc bolus in PACU. BP stable in 110s currently.  To 3S soon.     Virgina Jock, [place] Pager: [phone] 12/06/2014 3:50 PM

## 2014-12-06 NOTE — Progress Notes (Signed)
Patient transferred from PACU via bed on tele by PACU RN. Oriented patient to unit and room, instructed on callbell and placed at side. Family at bedside. Bed alarm on. Belongings with patient.

## 2014-12-06 NOTE — Telephone Encounter (Signed)
LM for pt re appt, dpm °

## 2014-12-06 NOTE — Telephone Encounter (Signed)
-----   Message from Gabriel Earing, [place] sent at 12/06/2014 12:47 PM EDT ----- S/p right CEA 12/06/14.  F/u with Dr. Oneida Alar in 2 weeks.  Thanks, Aldona Bar

## 2014-12-06 NOTE — Anesthesia Preprocedure Evaluation (Signed)
Anesthesia Evaluation  Patient identified by MRN, date of birth, ID band Patient awake    Reviewed: Allergy & Precautions, NPO status , Patient's Chart, lab work & pertinent test results  Airway Mallampati: II  TM Distance: >3 FB Neck ROM: Full    Dental no notable dental hx.    Pulmonary shortness of breath, COPD, former smoker,    Pulmonary exam normal breath sounds clear to auscultation       Cardiovascular + Peripheral Vascular Disease  Normal cardiovascular exam Rhythm:Regular Rate:Normal     Neuro/Psych negative neurological ROS  negative psych ROS   GI/Hepatic Neg liver ROS, GERD  ,  Endo/Other  negative endocrine ROS  Renal/GU negative Renal ROS     Musculoskeletal negative musculoskeletal ROS (+)   Abdominal   Peds  Hematology negative hematology ROS (+)   Anesthesia Other Findings   Reproductive/Obstetrics negative OB ROS                             Anesthesia Physical Anesthesia Plan  ASA: III  Anesthesia Plan: General   Post-op Pain Management:    Induction: Intravenous  Airway Management Planned: Oral ETT  Additional Equipment: Arterial line  Intra-op Plan:   Post-operative Plan: Extubation in OR  Informed Consent: I have reviewed the patients History and Physical, chart, labs and discussed the procedure including the risks, benefits and alternatives for the proposed anesthesia with the patient or authorized representative who has indicated his/her understanding and acceptance.   Dental advisory given  Plan Discussed with: CRNA  Anesthesia Plan Comments: (Remi gtt)        Anesthesia Quick Evaluation

## 2014-12-06 NOTE — Progress Notes (Signed)
ANTICOAGULATION CONSULT NOTE - Follow Up Consult  Pharmacy Consult:  Heparin Indication:  Bilateral carotid stenosis  No Known Allergies  Patient Measurements: Height: '5\' 4"'$  (162.6 cm) Weight: 184 lb (83.462 kg) IBW/kg (Calculated) : 54.7 Heparin Dosing Weight: 73 kg  Vital Signs: Temp: 99 F (37.2 C) (10/21 0500) Temp Source: Oral (10/21 0500) BP: 114/51 mmHg (10/21 0500) Pulse Rate: 98 (10/21 0500)  Labs:  Recent Labs  12/04/14 1709 12/05/14 0358 12/05/14 2330 12/06/14 0350  HGB 13.2 12.7  --  11.7*  HCT 41.0 39.7  --  36.9  PLT 213 194  --  208  LABPROT 13.3  --   --   --   INR 0.99  --   --   --   HEPARINUNFRC  --   --  0.48 0.58  CREATININE 0.80 0.56  --  0.56    Estimated Creatinine Clearance: 77.2 mL/min (by C-G formula based on Cr of 0.56).     Assessment: 51 YOF with bilateral carotid stenosis now s/p angiogram to continue on IV heparin.  Heparin level is therapeutic; no bleeding reported.   Goal of Therapy:  Heparin level 0.3-0.7 units/ml Monitor platelets by anticoagulation protocol: Yes    Plan:  - Continue heparin gtt at 1250 units/hr - Daily HL / CBC - F/U long-term anticoagulation plans    Paisli Silfies D. Mina Marble, PharmD, BCPS Pager:  [phone] 12/06/2014, 7:45 AM

## 2014-12-06 NOTE — Anesthesia Procedure Notes (Signed)
Procedure Name: Intubation Date/Time: 12/06/2014 11:00 AM Performed by: Rogers Blocker Pre-anesthesia Checklist: Patient identified, Timeout performed, Emergency Drugs available, Suction available and Patient being monitored Patient Re-evaluated:Patient Re-evaluated prior to inductionOxygen Delivery Method: Circle system utilized Preoxygenation: Pre-oxygenation with 100% oxygen Intubation Type: IV induction Ventilation: Mask ventilation without difficulty Laryngoscope Size: Mac and 3 Grade View: Grade I Tube type: Oral Tube size: 7.5 mm Number of attempts: 1 Airway Equipment and Method: Stylet Placement Confirmation: ETT inserted through vocal cords under direct vision,  positive ETCO2,  CO2 detector and breath sounds checked- equal and bilateral Secured at: 21 cm Tube secured with: Tape Dental Injury: Teeth and Oropharynx as per pre-operative assessment  Comments: Intubation performed by Delfina Redwood, RN.

## 2014-12-06 NOTE — Progress Notes (Signed)
[facility] [place]  Subjective  - Pt awake in PACU   Objective 91/50 83 98.3 F (36.8 C) (Oral) 24 97%  Intake/Output Summary (Last 24 hours) at 12/06/14 1548 Last data filed at 12/06/14 1412  Gross per 24 hour  Intake   2040 ml  Output   1150 ml  Net    890 ml   Right neck no hematoma Neuro UE/LE 5/5 motor, no sensory deficits face symmetric, A and O x3, tongue midline  Assessment/Planning: Stable post right CEA To 3S Most likely d/c am  [name] 12/06/2014 3:48 PM --  Laboratory Lab Results:  Recent Labs  12/05/14 0358 12/06/14 0350  WBC 6.8 5.1  HGB 12.7 11.7*  HCT 39.7 36.9  PLT 194 208   BMET  Recent Labs  12/05/14 0358 12/06/14 0350  NA 141 143  K 4.1 4.0  CL 105 104  CO2 28 28  GLUCOSE 98 87  BUN 12 10  CREATININE 0.56 0.56  CALCIUM 9.2 9.0    COAG Lab Results  Component Value Date   INR 0.99 12/04/2014   No results found for: PTT

## 2014-12-06 NOTE — Interval H&P Note (Signed)
History and Physical Interval Note:  12/06/2014 10:45 AM  Emily Rocha  has presented today for surgery, with the diagnosis of Symptomatic right carotid artery stenosis I65.21  The various methods of treatment have been discussed with the patient and family. After consideration of risks, benefits and other options for treatment, the patient has consented to  Procedure(s): ENDARTERECTOMY CAROTID; POSSIBLE PARTIAL STERNOTOMY (Right) as a surgical intervention right side.  The patient's history has been reviewed, patient examined, no change in status, stable for surgery.  I have reviewed the patient's chart and labs.  Questions were answered to the patient's satisfaction.     [name]

## 2014-12-06 NOTE — Op Note (Signed)
Procedure Right carotid endarterectomy  Preoperative diagnosis: High-grade asymptomatic right common/internal carotid artery stenosis  Postoperative diagnosis: Same  Anesthesia General  Asst.: Annamarie Major, MD, Silva Bandy, [facility]  Operative findings: #1 greater than 80% right internal/common carotid stenosis extending from the carotid bifurcation to the left common carotid origin                                                            #2 Dacron patch   Operative details: After obtaining informed consent, the patient was taken to the operating room. The patient was placed in a supine position on the operating room table. After induction of general anesthesia and endotracheal intubation a Foley catheter was placed. Next the patient's entire neck and chest was prepped and draped in the usual sterile fashion. An oblique incision was made on the right aspect of the patient's neck anterior to the border the right sternocleidomastoid muscle. The incision was carried into the subcutaneous tissues and through the platysma. The sternocleidomastoid muscle was identified and reflected laterally. The omohyoid muscle was identified and this was divided with cautery. The common carotid artery was then found at the base of the incision this was dissected free circumferentially. It was very calcified on palpation. To get to a suitable clamping spot I had to free up the common carotid all the way down to the origin of the common carotid and distal innominate to find a suitable place for clamping. The vagus nerve was identified and protected. Dissection was then carried up to the level carotid bifurcation.   The hyperglossal nerve was well above the primary area of dissection. It was identified with the ansa cervicalis inserting into it.  The internal carotid artery was dissected free circumferentially just below the level of the hypoglossal nerve and it was soft in character at this location and above any palpable  disease. A vessel loop was placed around this. Next the external carotid and superior thyroid arteries were dissected free circumferentially and vessel loops were placed around these. The patient was given 8000 units of intravenous heparin.  After 2 minutes of circulation time and raising the mean arterial pressure to 90 mm mercury, the distal internal carotid artery was controlled with small bulldog clamp. The external carotid and superior thyroid arteries were controlled with vessel loops. The common carotid artery was controlled with a peripheral DeBakey clamp. A longitudinal opening was made in the common carotid artery just below the bifurcation. The arteriotomy was extended proximally to the common carotid origin and distally up into the internal carotid with Potts scissors. There was a large calcified plaque with greater than 90% stenosis in the common carotid extending into the internal carotid.  There was vigorous backbleeding from the internal and I did not feel safe passing a shunt proximally into the patients calcified innominate.   Attention was then turned to the common carotid artery once again. A suitable endarterectomy plane was obtained and endarterectomy was begun in the common carotid artery and a good proximal endpoint was obtained. An eversion endarterectomy was performed on the external carotid artery and a good endpoint was obtained. The plaque was then elevated in the internal carotid artery and a nice feathered distal endpoint was also obtained.  The posterior wall was tacked with 2  7 0 prolene  sutures.  The plaque was passed off the table. All loose debris was then removed from the carotid bed and everything was thoroughly irrigated with heparinized saline. A Dacron patch was then brought on to the operative field and this was sewn on as a patch angioplasty using a running 6-0 Prolene suture. Prior to completion of the anastomosis the internal carotid artery was thoroughly backbled. This  was then controlled again with a fine bulldog clamp.  The common carotid was thoroughly flushed forward. The external carotid was also thoroughly backbled.  The remainder of the patch was completed and the anastomosis was secured. Flow was then restored first retrograde from the external carotid into the carotid bed then antegrade from the common carotid to the external carotid artery and after approximately 5 cardiac cycles to the internal carotid artery. Doppler was used to evaluate the external/internal and common carotid arteries and these all had good Doppler flow. Hemostasis was obtained with 1 additional repair suture. The patient was given an additional 8000 units of heparin during the case and the patient was also given 80 mg of Protamine at the conclusion of the case.      The platysma muscle was reapproximated using a running 3-0 Vicryl suture. The skin was closed with 4 0 Vicryl subcuticular stitch.  The patient was awakened in the operating room and was moving upper and lower extremities symmetrically and following commands.  The patient was stable on arrival to the PACU.  [name], MD [facility] [place] Office: [phone] Pager: [phone]

## 2014-12-06 NOTE — H&P (View-Only) (Signed)
[facility] [place]  Subjective  - no further episodes last night   Objective 103/82 87 97.4 F (36.3 C) (Oral) 26 98%  Intake/Output Summary (Last 24 hours) at 12/05/14 0927 Last data filed at 12/05/14 0400  Gross per 24 hour  Intake      0 ml  Output   1450 ml  Net  -1450 ml   Assessment/Planning: Arteriogram reviewed.  Pt has severe calcified length > 90% plaque right CEA most likely cause of her tia symptoms Will plan for right CEA tomorrow.  Pt has low bifurcation.  Plaque seems to end just above the clavicle.  Outside chance she would need partial sternotomy but doubt this.  [name] 12/05/2014 9:27 AM --  Laboratory Lab Results:  Recent Labs  12/04/14 1709 12/05/14 0358  WBC 6.0 6.8  HGB 13.2 12.7  HCT 41.0 39.7  PLT 213 194   BMET  Recent Labs  12/04/14 1709 12/05/14 0358  NA 143 141  K 3.8 4.1  CL 103 105  CO2 30 28  GLUCOSE 160* 98  BUN 13 12  CREATININE 0.80 0.56  CALCIUM 9.9 9.2    COAG Lab Results  Component Value Date   INR 0.99 12/04/2014   No results found for: PTT

## 2014-12-06 NOTE — Progress Notes (Signed)
Patient does not want to take morphine IV for pain due to prior morphine dose causing "sweats and feeling like I'm going to pass out." Currently nauseous and unable to take pills. Dr. Oneida Alar notified. New orders received.

## 2014-12-06 NOTE — Progress Notes (Signed)
Emily Rocha,Emily Rocha, has pt's dentures-upper/lower and bilateral hearing aids.

## 2014-12-06 NOTE — Progress Notes (Signed)
ANTICOAGULATION CONSULT NOTE - Follow Up Consult  Pharmacy Consult for heparin Indication: carotid stenosis   Labs:  Recent Labs  12/04/14 1709 12/05/14 0358 12/05/14 2330  HGB 13.2 12.7  --   HCT 41.0 39.7  --   PLT 213 194  --   LABPROT 13.3  --   --   INR 0.99  --   --   HEPARINUNFRC  --   --  0.48  CREATININE 0.80 0.56  --     Assessment/Plan:  59yo female therapeutic on heparin after resumed s/p angiogram. Will continue gtt at current rate and confirm stable with am labs.   Wynona Neat, PharmD, BCPS  12/06/2014,12:07 AM

## 2014-12-06 NOTE — Transfer of Care (Signed)
Immediate Anesthesia Transfer of Care Note  [name]  Procedure(s) Performed: Procedure(s): ENDARTERECTOMY CAROTid (Right)  Patient Location: PACU  Anesthesia Type:General  Level of Consciousness: awake, alert , patient cooperative and responds to stimulation  Airway & Oxygen Therapy: Patient Spontanous Breathing and Patient connected to nasal cannula oxygen  Post-op Assessment: Report given to RN, Post -op Vital signs reviewed and stable, Patient moving all extremities X 4 and Patient able to stick tongue midline  Post vital signs: Reviewed and stable  Last Vitals:  Filed Vitals:   12/06/14 0500  BP: 114/51  Pulse: 98  Temp: 37.2 C  Resp: 16    Complications: No apparent anesthesia complications

## 2014-12-07 LAB — BASIC METABOLIC PANEL
Anion gap: 11 (ref 5–15)
BUN: 7 mg/dL (ref 6–20)
CHLORIDE: 101 mmol/L (ref 101–111)
CO2: 26 mmol/L (ref 22–32)
Calcium: 8.5 mg/dL — ABNORMAL LOW (ref 8.9–10.3)
Creatinine, Ser: 0.53 mg/dL (ref 0.44–1.00)
GFR calc Af Amer: >60 mL/min (ref >60)
GFR calc non Af Amer: >60 mL/min (ref >60)
GLUCOSE: 91 mg/dL (ref 65–99)
POTASSIUM: 3.8 mmol/L (ref 3.5–5.1)
Sodium: 138 mmol/L (ref 135–145)

## 2014-12-07 LAB — CBC
HCT: 31.4 % — ABNORMAL LOW (ref 36.0–46.0)
HEMOGLOBIN: 10.1 g/dL — AB (ref 12.0–15.0)
MCH: 28.4 pg (ref 26.0–34.0)
MCHC: 32.2 g/dL (ref 30.0–36.0)
MCV: 88.2 fL (ref 78.0–100.0)
PLATELETS: 201 10*3/uL (ref 150–400)
RBC: 3.56 MIL/uL — AB (ref 3.87–5.11)
RDW: 14.3 % (ref 11.5–15.5)
WBC: 6.6 10*3/uL (ref 4.0–10.5)

## 2014-12-07 MED ORDER — OXYCODONE HCL 5 MG PO TABS: 5.0000 mg | ORAL_TABLET | Freq: Four times a day (QID) | ORAL | Status: DC | PRN

## 2014-12-07 MED ORDER — ATORVASTATIN CALCIUM 10 MG PO TABS: 10.0000 mg | ORAL_TABLET | Freq: Every day | ORAL | Status: DC

## 2014-12-07 NOTE — Progress Notes (Signed)
Patient prescriptions and and discharge instructions given and explained to patient and patient's husband. All questions answered. Belongings sent with patient. To be discharged via wheelchair by Tilda Burrow, RN

## 2014-12-07 NOTE — Progress Notes (Addendum)
  [facility] Progress Note  12/07/2014 7:51 AM 1 Day Post-Op  Subjective:  Had some "shooting" chest discomfort (at distal left sternal border) 30 minutes ago after being lifted and repositioned in the bed. The pain This has resolved after a few minutes. No shortness of breath, diaphoresis or nausea.   Currently comfortable. No other complaints.   Filed Vitals:   12/07/14 0747  BP:   Pulse:   Temp: 98.4 F (36.9 C)  Resp:   BP 80s-110s 02 95% 1L   Physical Exam: Neuro: Tongue midline. Smile symmetric. 5/5 strength upper and lower extremities bilaterally    Incision:  Right neck incision without hematoma.   CBC    Component Value Date/Time   WBC 6.6 12/07/2014 0536   RBC 3.56* 12/07/2014 0536   HGB 10.1* 12/07/2014 0536   HCT 31.4* 12/07/2014 0536   PLT 201 12/07/2014 0536   MCV 88.2 12/07/2014 0536   MCH 28.4 12/07/2014 0536   MCHC 32.2 12/07/2014 0536   RDW 14.3 12/07/2014 0536    BMET    Component Value Date/Time   NA 138 12/07/2014 0536   K 3.8 12/07/2014 0536   CL 101 12/07/2014 0536   CO2 26 12/07/2014 0536   GLUCOSE 91 12/07/2014 0536   BUN 7 12/07/2014 0536   CREATININE 0.53 12/07/2014 0536   CALCIUM 8.5* 12/07/2014 0536   GFRNONAA >60 12/07/2014 0536   GFRAA >60 12/07/2014 0536     Intake/Output Summary (Last 24 hours) at 12/07/14 0751 Last data filed at 12/07/14 0300  Gross per 24 hour  Intake   3290 ml  Output   1925 ml  Net   1365 ml      Assessment/Plan:  This is a 59 y.o. female who is s/p right CEA 1 Day Post-Op  -Patient is doing well this am. Doubt chest discomfort is of cardiac etiology. Likely musculoskeletal with repositioning in bed.  -Neuro exam is intact -Needs to ambulate. -Has voided. -Will observe for any further chest discomfort this am. If none, d/c in afternoon.  -Modified rankin: 0 -VQI: d/c on ASA and statin.    Virgina Jock, PA-C [facility] Office: [phone] Pager:  [phone] 12/07/2014 7:51 AM  No problems overnight Neck incision no hematoma Neuro intact Will d/c home ASA Emphasized no further smoking  [name], MD [facility] [place] Office: [phone] Pager: [phone]

## 2014-12-08 NOTE — Anesthesia Postprocedure Evaluation (Signed)
  Anesthesia Post-op Note  [name]  Procedure(s) Performed: Procedure(s): ENDARTERECTOMY CAROTid (Right)  Patient Location: PACU  Anesthesia Type:General  Level of Consciousness: awake  Airway and Oxygen Therapy: Patient Spontanous Breathing  Post-op Pain: mild  Post-op Assessment: Post-op Vital signs reviewed, Patient's Cardiovascular Status Stable, Respiratory Function Stable, Patent Airway, No signs of Nausea or vomiting and Pain level controlled   LLE Sensation: Full sensation   RLE Sensation: Full sensation      Post-op Vital Signs: Reviewed and stable  Last Vitals:  Filed Vitals:   12/07/14 1130  BP: 113/62  Pulse: 91  Temp:   Resp: 24    Complications: No apparent anesthesia complications

## 2014-12-09 ENCOUNTER — Encounter: Payer: Self-pay | Admitting: Nurse Practitioner

## 2014-12-09 ENCOUNTER — Encounter (HOSPITAL_COMMUNITY): Payer: Self-pay | Admitting: Vascular Surgery

## 2014-12-09 NOTE — Addendum Note (Signed)
Addendum  created 12/09/14 8916 by Glynda Jaeger, CRNA   Modules edited: Anesthesia Events

## 2014-12-09 NOTE — Addendum Note (Signed)
Addendum  created 12/09/14 0569 by Glynda Jaeger, CRNA   Modules edited: Anesthesia Events, Narrator   Narrator:  Narrator: Event Log Edited

## 2014-12-10 NOTE — Discharge Summary (Signed)
[facility] Discharge Summary  Emily Rocha Feb 24, 1955 59 y.o. female  [id number]  Admission Date: 12/04/2014  Discharge Date: 12/07/2014  Physician: [name], MD  Admission Diagnosis: symtomatic right carodid stenosis right carotid stenosis Symptomatic right carotid artery stenosis I65.21  HPI:   This is a 59 y.o. female who presented for evaluation of bilateral carotid stenosis. Three years ago, she had an isolated episode of numbness affecting her left posterior neck, left arm and left leg. This resolved within a few minutes. She went to see a physician at that time who recommended a carotid duplex. The patient declined the test due to financial reasons. She was advised to start on a baby aspirin and to report to the hospital if a similar episode occurred.   About 5 weeks ago, she started having similar symptoms of left sided numbness that would resolve after a few minutes. She denies any weakness. These episodes have occurred intermittently during the past 5 weeks. She has seen her PCP who referred her here for further evaluation. Following her carotid duplex today in the office, she reports left sided numbness. Additionally, she reported an episode of numbness while being examined by Dr. Oneida Alar.   The patient has also had two episodes of temporary right eye blindness described as a "cloud" covering her right eye. The most recent episodes being yesterday. These episodes would last for approximately a minute. She denies expressive or receptive aphasia.   The patient does not smoke cigarettes but has used a vaporizer for the past 5 years. She says she is trying to cut down. She has no history of hypertension, hyperlipidemia or diabetes. She does have COPD. 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   She denies any history of claudication or  rest pain. She denies any cardiac history or prior history of stroke. She currently takes a baby aspirin daily.   Hospital Course:  The patient was admitted to the hospital on 12/04/14 for IV heparin. She was taken to the [facility] lab and underwent an arch and carotid angiogram as her disease involved the common carotid artery.     She was taken to the [facility] lab on 12/05/14 for an arch and bilateral carotid angiogram.   Findings included:   Extensive calcific atherosclerosis of right common carotid artery: >90% stenosis  Bilateral internal carotid arteries with minimal disease  Right external carotid artery stenosis 50-75% stenosis  Left external carotid artery stenosis <50%  Left common carotid artery with <50% stenosis proximally  Dr. Oneida Alar reviewed the study results and the decision was made to take the patient for a right carotid endarterectomy the following day.   The patient was taken to the operating room on 12/06/14 and underwent right carotid endarterectomy. The patient tolerated the procedure well and was transferred to the recovery room in stable condition.   By POD 1, the patient's neuro status was intact. Her neck incision was clean without hematoma. She was ambulating, voiding and tolerating a diet without difficulty. She was discharged home on POD 1 in good condition.   Discharge Instructions:   The patient is discharged to home with extensive instructions on wound care and progressive ambulation.  They are instructed not to drive or perform any heavy lifting until returning to see the physician in his office.  Discharge Instructions    CAROTID Sugery: Call MD for difficulty swallowing  or speaking; weakness in arms or legs that is a new symtom; severe headache.  If you have increased swelling in the neck and/or  are having difficulty breathing, CALL [phone]    Complete by:  As directed      Call MD for:  redness, tenderness, or signs of infection (pain, swelling, bleeding, redness,  odor or green/yellow discharge around incision site)    Complete by:  As directed      Call MD for:  severe or increased pain, loss or decreased feeling  in affected limb(s)    Complete by:  As directed      Call MD for:  temperature >100.5    Complete by:  As directed      Discharge wound care:    Complete by:  As directed   Shower daily with soap and water starting 12/08/14     Driving Restrictions    Complete by:  As directed   No driving for 2 weeks     Lifting restrictions    Complete by:  As directed   No lifting for 2 weeks     Resume previous diet    Complete by:  As directed            Discharge Diagnosis:  symtomatic right carodid stenosis right carotid stenosis Symptomatic right carotid artery stenosis I65.21  Secondary Diagnosis: Patient Active Problem List   Diagnosis Date Noted  . Carotid artery stenosis, symptomatic 12/06/2014  . Symptomatic carotid artery stenosis 12/04/2014   Past Medical History  Diagnosis Date  . COPD (chronic obstructive pulmonary disease) ([place])   . Shortness of breath dyspnea   . GERD (gastroesophageal reflux disease)       Medication List    TAKE these medications        ALPRAZolam 0.5 MG tablet  Commonly known as:  XANAX  Take 0.5 mg by mouth as needed for anxiety (take one (1) TABLET one hour po before test).     aspirin EC 81 MG tablet  Take 81 mg by mouth daily.     atorvastatin 10 MG tablet  Commonly known as:  LIPITOR  Take 1 tablet (10 mg total) by mouth daily.     oxyCODONE 5 MG immediate release tablet  Commonly known as:  Oxy IR/ROXICODONE  Take 1-2 tablets (5-10 mg total) by mouth every 6 (six) hours as needed for moderate pain.     pantoprazole 40 MG tablet  Commonly known as:  PROTONIX  Take 1 tablet by mouth daily.     SYMBICORT 160-4.5 MCG/ACT inhaler  Generic drug:  budesonide-formoterol  Inhale 2 puffs into the lungs daily.     VENTOLIN HFA 108 (90 BASE) MCG/ACT inhaler  Generic drug:   albuterol  Inhale 2 puffs into the lungs every 6 (six) hours as needed.     VITAMIN D PO  Take 1 tablet by mouth daily.        Oxycodone #20 No Refill  Disposition: Home  Patient's condition: is Good  Follow up: 1. Dr.  Oneida Alar in 2 weeks.   Virgina Jock, PA-C [facility] [phone]  --- For [facility] use --- Instructions: Press F2 to tab through selections.  Delete question if not applicable.   Modified Rankin score at D/C (0-6): 0  IV medication needed for:  1. Hypertension: No 2. Hypotension: No  Post-op Complications: No  1. Post-op CVA or TIA: No  2. CN injury: No  3. Myocardial infarction: No  4.  CHF: No  5.  Dysrhythmia (new): No  6. Wound infection: No  7. Reperfusion symptoms: No  8. Return to OR: No  Discharge medications: Statin use:  Yes If No: '[ ]'$  For Medical reasons, '[ ]'$  Non-compliant, '[ ]'$  Not-indicated ASA use:  Yes  If No: '[ ]'$  For Medical reasons, '[ ]'$  Non-compliant, '[ ]'$  Not-indicated Beta blocker use:  No If No: '[ ]'$  For Medical reasons, '[ ]'$  Non-compliant, [x ] Not-indicated ACE-Inhibitor use:  No If No: '[ ]'$  For Medical reasons, '[ ]'$  Non-compliant, '[ ]'$  Not-indicated P2Y12 Antagonist use: No, '[ ]'$  Plavix, '[ ]'$  Plasugrel, '[ ]'$  Ticlopinine, '[ ]'$  Ticagrelor, '[ ]'$  Other, '[ ]'$  No for medical reason, '[ ]'$  Non-compliant, [x ] Not-indicated Anti-coagulant use:  No, '[ ]'$  Warfarin, '[ ]'$  Rivaroxaban, '[ ]'$  Dabigatran, '[ ]'$  Other, '[ ]'$  No for medical reason, '[ ]'$  Non-compliant, '[ ]'$  Not-indicated

## 2014-12-11 ENCOUNTER — Encounter: Payer: 59 | Admitting: Vascular Surgery

## 2014-12-17 ENCOUNTER — Encounter: Payer: Self-pay | Admitting: Vascular Surgery

## 2014-12-19 ENCOUNTER — Encounter: Payer: Self-pay | Admitting: Vascular Surgery

## 2014-12-19 ENCOUNTER — Ambulatory Visit (INDEPENDENT_AMBULATORY_CARE_PROVIDER_SITE_OTHER): Payer: 59 | Admitting: Vascular Surgery

## 2014-12-19 VITALS — BP 133/74 | HR 101 | Temp 98.8°F | Resp 24 | Ht 63.5 in | Wt 179.5 lb

## 2014-12-19 DIAGNOSIS — I6529 Occlusion and stenosis of unspecified carotid artery: Secondary | ICD-10-CM | POA: Insufficient documentation

## 2014-12-19 DIAGNOSIS — Z48812 Encounter for surgical aftercare following surgery on the circulatory system: Secondary | ICD-10-CM

## 2014-12-19 DIAGNOSIS — I6521 Occlusion and stenosis of right carotid artery: Secondary | ICD-10-CM

## 2014-12-19 NOTE — Addendum Note (Signed)
Addended by: Dorthula Rue L on: 12/19/2014 02:07 PM   Modules accepted: Orders

## 2014-12-19 NOTE — Progress Notes (Signed)
  POST OPERATIVE OFFICE NOTE    CC:  F/u for surgery  HPI:  This is a 59 y.o. female who is s/p Right CEA.  She is doing well over all.  She did have a bought of GI up set that has since resolved.  She is lowing her tobacco use and plans to stop within the next month.    Allergies  Allergen Reactions  . Morphine And Related Other (See Comments)    Stated broke out in sweat, felt faint, and BP dropped    Current Outpatient Prescriptions  Medication Sig Dispense Refill  . ALPRAZolam (XANAX) 0.5 MG tablet Take 0.5 mg by mouth as needed for anxiety (take one (1) TABLET one hour po before test).    Marland Kitchen aspirin EC 81 MG tablet Take 81 mg by mouth daily.    Marland Kitchen atorvastatin (LIPITOR) 10 MG tablet Take 1 tablet (10 mg total) by mouth daily. 30 tablet 11  . Cholecalciferol (VITAMIN D PO) Take 1 tablet by mouth daily.    Marland Kitchen oxyCODONE (OXY IR/ROXICODONE) 5 MG immediate release tablet Take 1-2 tablets (5-10 mg total) by mouth every 6 (six) hours as needed for moderate pain. 30 tablet 0  . pantoprazole (PROTONIX) 40 MG tablet Take 1 tablet by mouth daily.  0  . SYMBICORT 160-4.5 MCG/ACT inhaler Inhale 2 puffs into the lungs daily.  0  . VENTOLIN HFA 108 (90 BASE) MCG/ACT inhaler Inhale 2 puffs into the lungs every 6 (six) hours as needed.  0   No current facility-administered medications for this visit.     ROS:  See HPI  Physical Exam:  Filed Vitals:   12/19/14 1226  BP: 133/74  Pulse:   Temp:   Resp:     Incision:  Well healed without erythema or edema No tongue deviation and smile is symmetric Grip is 5/5 bilaterally No carotid bruit auscultated   Assessment/Plan:  This is a 59 y.o. female who is s/p:right CEA  History of TIA prior to surgery and Duplex showed >80% stenosis on the right and 40-60% on the right. She will f/u in 6 months for re-peat carotid duplex.   Theda Sers, EMMA MAUREEN PA-C [facility]   Clinic MD:  Pt seen and examined with Dr. Oneida Alar   History and exam findings as above. Patient with recent right carotid endarterectomy for symptomatic stenosis. She has had no further TIA of a events or stroke symptoms since her operation. No tongue deviation or difficulty swallowing. The patient was again counseled him smoking cessation today. She will follow-up in 6 months time.  [name], MD [facility] [place] Office: [phone] Pager: [phone]

## 2014-12-20 ENCOUNTER — Encounter: Payer: Self-pay | Admitting: Nurse Practitioner

## 2015-04-28 ENCOUNTER — Telehealth: Payer: Self-pay

## 2015-04-28 NOTE — Telephone Encounter (Signed)
Spoke with pt to schedule, dpm °

## 2015-04-28 NOTE — Telephone Encounter (Signed)
Pt. Called with report of intermittent episodes of pain from the back of her head, "where the skull starts on the left and up the left side of my head."  Reported that she has had approx. 4 episodes of this over the past 4-5 months, and that they last only a minute.  Stated things get a little hazy during the episode, and then things feel okay.  Denied any numbness or weakness in extremities, or any change in vision with the episodes. Stated that the pain is so intense she almost feels like she could pass out.  Advised will have her 6 month surveillance appt. moved to an earlier date.  Advised to call the office if symptoms worsen, or to go to the ER if worsening of symptoms after hours.  Verb. Understanding.

## 2015-04-30 ENCOUNTER — Encounter: Payer: Self-pay | Admitting: Family

## 2015-05-08 ENCOUNTER — Ambulatory Visit (INDEPENDENT_AMBULATORY_CARE_PROVIDER_SITE_OTHER): Payer: BLUE CROSS/BLUE SHIELD | Admitting: Family

## 2015-05-08 ENCOUNTER — Ambulatory Visit (HOSPITAL_COMMUNITY)
Admission: RE | Admit: 2015-05-08 | Discharge: 2015-05-08 | Disposition: A | Discharge status: Home / Self Care | Payer: BLUE CROSS/BLUE SHIELD | Source: Ambulatory Visit | Attending: Family | Admitting: Family

## 2015-05-08 ENCOUNTER — Encounter: Payer: Self-pay | Admitting: Family

## 2015-05-08 VITALS — BP 145/85 | HR 89 | Ht 63.5 in | Wt 183.1 lb

## 2015-05-08 DIAGNOSIS — R42 Dizziness and giddiness: Secondary | ICD-10-CM | POA: Diagnosis not present

## 2015-05-08 DIAGNOSIS — J449 Chronic obstructive pulmonary disease, unspecified: Secondary | ICD-10-CM | POA: Insufficient documentation

## 2015-05-08 DIAGNOSIS — Z9889 Other specified postprocedural states: Secondary | ICD-10-CM | POA: Diagnosis not present

## 2015-05-08 DIAGNOSIS — I6522 Occlusion and stenosis of left carotid artery: Secondary | ICD-10-CM | POA: Insufficient documentation

## 2015-05-08 DIAGNOSIS — K219 Gastro-esophageal reflux disease without esophagitis: Secondary | ICD-10-CM | POA: Insufficient documentation

## 2015-05-08 DIAGNOSIS — Z48812 Encounter for surgical aftercare following surgery on the circulatory system: Secondary | ICD-10-CM | POA: Diagnosis not present

## 2015-05-08 DIAGNOSIS — I6521 Occlusion and stenosis of right carotid artery: Secondary | ICD-10-CM

## 2015-05-08 NOTE — Progress Notes (Signed)
Chief Complaint: Extracranial Carotid Artery Stenosis   History of Present Illness  Emily Rocha is a 60 y.o. female patient of Dr. Oneida Alar who is s/p Right CEA on 12/06/14. She had several preoperative TIA's for 6 months as manifested by left hemiparesis.  She returns today sooner than her 6 months follow up from November 2016 with c/o "feeling fuzzy headed and dizzy" intermittently since about January 2017, not associated with any activity. She denies any lateralizing sx's: denies recent monocular loss of vision, hemiparesis, or speach difficulties.   She denies any hx of migraine headaches, denies seasonal allergies but states her nasal passages feel congested. 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 Quickly looking from computer screen to documents multiple times/day.  Pt denies any cardiac issues.   Pt Diabetic: no Pt smoker: former smoker, quit in 2013, on low dose nicotine vapor product since then  Pt meds include: Statin : yes ASA: yes Other anticoagulants/antiplatelets: no   Past Medical History  Diagnosis Date  . COPD (chronic obstructive pulmonary disease) ([place])   . Shortness of breath dyspnea   . GERD (gastroesophageal reflux disease)     Social History Social History  Substance Use Topics  . Smoking status: Former Smoker -- 2.00 packs/day for 40 years    Quit date: 10/03/2010  . Smokeless tobacco: None     Comment: using a vapor cigarette @ 6 mg Nicotine  . Alcohol Use: No    Family History Family History  Problem Relation Age of Onset  . Heart attack Mother   . Cancer Mother   . Diabetes Mother   . Heart disease Mother   . Heart attack Father   . Heart disease Father     Surgical History Past Surgical History  Procedure Laterality Date  . Cholecystectomy    . Colonoscopy N/A 10/03/2014    Procedure: COLONOSCOPY;  Surgeon: Rogene Houston, MD;  [facility];  Service: Endoscopy;  Laterality: N/A;  730  .  Peripheral vascular catheterization N/A 12/05/2014    Procedure:  Carotid Arch Angiography;  Surgeon: Conrad Waterville, MD;  Location: [facility];  Service: Cardiovascular;  Laterality: N/A;  . Endarterectomy Right 12/06/2014    Procedure: ENDARTERECTOMY CAROTid;  Surgeon: Elam Dutch, MD;  Location: [facility];  Service: Vascular;  Laterality: Right;    Allergies  Allergen Reactions  . Morphine And Related Other (See Comments)    Stated broke out in sweat, felt faint, and BP dropped    Current Outpatient Prescriptions  Medication Sig Dispense Refill  . ALPRAZolam (XANAX) 0.5 MG tablet Take 0.5 mg by mouth as needed for anxiety (take one (1) TABLET one hour po before test).    Marland Kitchen aspirin EC 81 MG tablet Take 81 mg by mouth daily.    Marland Kitchen atorvastatin (LIPITOR) 10 MG tablet Take 1 tablet (10 mg total) by mouth daily. 30 tablet 11  . Cholecalciferol (VITAMIN D PO) Take 1 tablet by mouth daily.    . pantoprazole (PROTONIX) 40 MG tablet Take 1 tablet by mouth daily.  0  . SYMBICORT 160-4.5 MCG/ACT inhaler Inhale 2 puffs into the lungs daily.  0  . VENTOLIN HFA 108 (90 BASE) MCG/ACT inhaler Inhale 2 puffs into the lungs every 6 (six) hours as needed.  0  . oxyCODONE (OXY IR/ROXICODONE) 5 MG immediate release tablet Take 1-2 tablets (5-10 mg total) by mouth every 6 (six) hours as needed  for moderate pain. (Patient not taking: Reported on 05/08/2015) 30 tablet 0   No current facility-administered medications for this visit.    Review of Systems : See HPI for pertinent positives and negatives.  Physical Examination  Filed Vitals:   05/08/15 1352 05/08/15 1355  BP: 154/90 145/85  Pulse: 89   Height: 5' 3.5" (1.613 m)   Weight: 183 lb 1.6 oz (83.054 kg)   SpO2: 95%    Body mass index is 31.92 kg/(m^2).  General: WDWN obese female in NAD; central obesity GAIT: normal Eyes: PERRLA Pulmonary:  Non-labored respirations, CTAB  Cardiac: regular rhythm, no detected murmur.  VASCULAR  EXAM Carotid Bruits Right Left   Negative Negative    Aorta is not palpable. Radial pulses are 2+ palpable and equal.                                                                                                                            LE Pulses Right Left       POPLITEAL   not palpable   not palpable       POSTERIOR TIBIAL  faintly palpable    palpable        DORSALIS PEDIS      ANTERIOR TIBIAL  palpable   palpable     Gastrointestinal: soft, nontender, BS WNL, no r/g,  no palpable masses.  Musculoskeletal: no muscle atrophy/wasting. M/S 5/5 throughout, extremities without ischemic changes.  Neurologic: A&O X 3; Appropriate Affect, Speech is normal CN 2-12 intact, pain and light touch intact in extremities, Motor exam as listed above.   Non-Invasive Vascular Imaging CAROTID DUPLEX 05/08/2015   Moderate appearing disease is present at the innominate bifurcation extending into the proximal CCA and subclavian artery w/o significantly elevated velocities. Widely patent right CEA site w/o evidence of restenosis. <40% stenosis of the left ICA. 1-49% stenosis of the left CCA. Bilateral vertebral artery is antegrade. Right CCA improvement since right CEA.     Assessment: Emily Rocha is a 60 y.o. female who is s/p Right CEA on 12/06/14. She had several preoperative TIA's for 6 months as manifested by left hemiparesis.  She returns today sooner than her 6 months follow up from November 2016 with c/o "feeling fuzzy headed and dizzy" intermittently since about January 2017, not associated with any activity. She denies any lateralizing sx's: denies recent monocular loss of vision, hemiparesis, or speach difficulties.   She denies any hx of migraine headaches, denies seasonal allergies but states her nasal passages feel congested. 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 Quickly looking from computer screen to documents multiple times/day.   I spoke with  Dr. Scot Dock. She has no lateralizing neurological symptoms. Her extracranial carotid artery disease is minimal/moderate. Her bilateral vertebral artery is antegrade.  The source of her above sx's is unlikely to originate from her extracranial carotid arteries; discussed this with the pt and her husband, they  seemed relieved.  I advised pt to follow up with her PCP re her sx's; may consider referral to neurologist or ENT if her sx's do not improve, defer to PCP.   Plan: Follow-up in 6 months with Carotid Duplex scan.   I discussed in depth with the patient the nature of atherosclerosis, and emphasized the importance of maximal medical management including strict control of blood pressure, blood glucose, and lipid levels, obtaining regular exercise, and continued cessation of smoking.  The patient is aware that without maximal medical management the underlying atherosclerotic disease process will progress, limiting the benefit of any interventions. The patient was given information about stroke prevention and what symptoms should prompt the patient to seek immediate medical care. Thank you for allowing [place] to participate in this patient's care.  Clemon Chambers, RN, MSN, FNP-C [facility] Office: [phone]  Clinic Physician: Scot Dock on call  05/08/2015 2:04 PM

## 2015-05-08 NOTE — Patient Instructions (Signed)
Stroke Prevention Some medical conditions and behaviors are associated with an increased chance of having a stroke. You may prevent a stroke by making healthy choices and managing medical conditions. HOW CAN I REDUCE MY RISK OF HAVING A STROKE?   Stay physically active. Get at least 30 minutes of activity on most or all days.  Do not smoke. It may also be helpful to avoid exposure to secondhand smoke.  Limit alcohol use. Moderate alcohol use is considered to be:  No more than 2 drinks per day for men.  No more than 1 drink per day for nonpregnant women.  Eat healthy foods. This involves:  Eating 5 or more servings of fruits and vegetables a day.  Making dietary changes that address high blood pressure (hypertension), high cholesterol, diabetes, or obesity.  Manage your cholesterol levels.  Making food choices that are high in fiber and low in saturated fat, trans fat, and cholesterol may control cholesterol levels.  Take any prescribed medicines to control cholesterol as directed by your health care provider.  Manage your diabetes.  Controlling your carbohydrate and sugar intake is recommended to manage diabetes.  Take any prescribed medicines to control diabetes as directed by your health care provider.  Control your hypertension.  Making food choices that are low in salt (sodium), saturated fat, trans fat, and cholesterol is recommended to manage hypertension.  Ask your health care provider if you need treatment to lower your blood pressure. Take any prescribed medicines to control hypertension as directed by your health care provider.  If you are 18-39 years of age, have your blood pressure checked every 3-5 years. If you are 40 years of age or older, have your blood pressure checked every year.  Maintain a healthy weight.  Reducing calorie intake and making food choices that are low in sodium, saturated fat, trans fat, and cholesterol are recommended to manage  weight.  Stop drug abuse.  Avoid taking birth control pills.  Talk to your health care provider about the risks of taking birth control pills if you are over 35 years old, smoke, get migraines, or have ever had a blood clot.  Get evaluated for sleep disorders (sleep apnea).  Talk to your health care provider about getting a sleep evaluation if you snore a lot or have excessive sleepiness.  Take medicines only as directed by your health care provider.  For some people, aspirin or blood thinners (anticoagulants) are helpful in reducing the risk of forming abnormal blood clots that can lead to stroke. If you have the irregular heart rhythm of atrial fibrillation, you should be on a blood thinner unless there is a good reason you cannot take them.  Understand all your medicine instructions.  Make sure that other conditions (such as anemia or atherosclerosis) are addressed. SEEK IMMEDIATE MEDICAL CARE IF:   You have sudden weakness or numbness of the face, arm, or leg, especially on one side of the body.  Your face or eyelid droops to one side.  You have sudden confusion.  You have trouble speaking (aphasia) or understanding.  You have sudden trouble seeing in one or both eyes.  You have sudden trouble walking.  You have dizziness.  You have a loss of balance or coordination.  You have a sudden, severe headache with no known cause.  You have new chest pain or an irregular heartbeat. Any of these symptoms may represent a serious problem that is an emergency. Do not wait to see if the symptoms will   go away. Get medical help at once. Call your local emergency services (911 in [place] Do not drive yourself to the hospital.   This information is not intended to replace advice given to you by your health care provider. Make sure you discuss any questions you have with your health care provider.   Document Released: 03/11/2004 Document Revised: 02/22/2014 Document Reviewed:  08/04/2012 Elsevier Interactive Patient Education 2016 Elsevier Inc.  

## 2015-05-09 ENCOUNTER — Other Ambulatory Visit: Payer: Self-pay | Admitting: *Deleted

## 2015-05-09 DIAGNOSIS — I6523 Occlusion and stenosis of bilateral carotid arteries: Secondary | ICD-10-CM

## 2015-06-19 ENCOUNTER — Ambulatory Visit: Payer: 59 | Admitting: Vascular Surgery

## 2015-06-19 ENCOUNTER — Encounter (HOSPITAL_COMMUNITY): Payer: 59

## 2015-06-27 ENCOUNTER — Ambulatory Visit (INDEPENDENT_AMBULATORY_CARE_PROVIDER_SITE_OTHER): Payer: BLUE CROSS/BLUE SHIELD | Admitting: Cardiology

## 2015-06-27 ENCOUNTER — Encounter: Payer: Self-pay | Admitting: Cardiology

## 2015-06-27 ENCOUNTER — Telehealth: Payer: Self-pay | Admitting: *Deleted

## 2015-06-27 VITALS — BP 118/84 | HR 63 | Ht 64.0 in | Wt 181.0 lb

## 2015-06-27 DIAGNOSIS — R0789 Other chest pain: Secondary | ICD-10-CM | POA: Diagnosis not present

## 2015-06-27 DIAGNOSIS — R011 Cardiac murmur, unspecified: Secondary | ICD-10-CM

## 2015-06-27 NOTE — Patient Instructions (Signed)
Your physician recommends that you schedule a follow-up appointment We will call with test results  Your physician recommends that you continue on your current medications as directed. Please refer to the Current Medication list given to you today.  Your physician has requested that you have an echocardiogram. Echocardiography is a painless test that uses sound waves to create images of your heart. It provides your doctor with information about the size and shape of your heart and how well your heart's chambers and valves are working. This procedure takes approximately one hour. There are no restrictions for this procedure.  If you need a refill on your cardiac medications before your next appointment, please call your pharmacy.  Thank you for choosing [facility]!

## 2015-06-27 NOTE — Progress Notes (Signed)
Patient ID: Emily Rocha, female   DOB: 1955/08/10, 60 y.o.   MRN: [id number]     Clinical Summary Emily Rocha is a 60 y.o.female seen today as a new patient, she is referred by Dr Woody Seller for the following medical problems.  1. Chest pain - episode last Saturday AM, woke from sleep. Sharp pain midchest, 10/10. No other associated symptoms. Resolved with sitting up. Last approx 3-4 minutes.  - reporst similar episodes on and off since March.  - notes some recent DOE    CAD risk factors; hyperlipidemia, PAD, +tobacco, father MI age 94, mother CABG 70.   2. Carotid stenosis - followed by vascular, s/p right CEA Past Medical History  Diagnosis Date  . COPD (chronic obstructive pulmonary disease) ([place])   . Shortness of breath dyspnea   . GERD (gastroesophageal reflux disease)      Allergies  Allergen Reactions  . Morphine And Related Other (See Comments)    Stated broke out in sweat, felt faint, and BP dropped     Current Outpatient Prescriptions  Medication Sig Dispense Refill  . ALPRAZolam (XANAX) 0.5 MG tablet Take 0.5 mg by mouth as needed for anxiety (take one (1) TABLET one hour po before test).    Marland Kitchen aspirin EC 81 MG tablet Take 81 mg by mouth daily.    Marland Kitchen atorvastatin (LIPITOR) 10 MG tablet Take 1 tablet (10 mg total) by mouth daily. 30 tablet 11  . Cholecalciferol (VITAMIN D PO) Take 1 tablet by mouth daily.    Marland Kitchen oxyCODONE (OXY IR/ROXICODONE) 5 MG immediate release tablet Take 1-2 tablets (5-10 mg total) by mouth every 6 (six) hours as needed for moderate pain. (Patient not taking: Reported on 05/08/2015) 30 tablet 0  . pantoprazole (PROTONIX) 40 MG tablet Take 1 tablet by mouth daily.  0  . SYMBICORT 160-4.5 MCG/ACT inhaler Inhale 2 puffs into the lungs daily.  0  . VENTOLIN HFA 108 (90 BASE) MCG/ACT inhaler Inhale 2 puffs into the lungs every 6 (six) hours as needed.  0   No current facility-administered medications for this visit.     Past Surgical History  Procedure  Laterality Date  . Cholecystectomy    . Colonoscopy N/A 10/03/2014    Procedure: COLONOSCOPY;  Surgeon: Rogene Houston, MD;  [facility];  Service: Endoscopy;  Laterality: N/A;  730  . Peripheral vascular catheterization N/A 12/05/2014    Procedure:  Carotid Arch Angiography;  Surgeon: Conrad Brewster, MD;  Location: [facility];  Service: Cardiovascular;  Laterality: N/A;  . Endarterectomy Right 12/06/2014    Procedure: ENDARTERECTOMY CAROTid;  Surgeon: Elam Dutch, MD;  Location: [facility];  Service: Vascular;  Laterality: Right;     Allergies  Allergen Reactions  . Morphine And Related Other (See Comments)    Stated broke out in sweat, felt faint, and BP dropped      Family History  Problem Relation Age of Onset  . Heart attack Mother   . Cancer Mother   . Diabetes Mother   . Heart disease Mother   . Heart attack Father   . Heart disease Father      Social History Emily Rocha reports that she quit smoking about 4 years ago. She does not have any smokeless tobacco history on file. Emily Rocha reports that she does not drink alcohol.   Review of Systems CONSTITUTIONAL: No weight loss, fever, chills, weakness or fatigue.  HEENT: Eyes: No visual loss, blurred vision,  double vision or yellow sclerae.No hearing loss, sneezing, congestion, runny nose or sore throat.  SKIN: No rash or itching.  CARDIOVASCULAR: per HPI RESPIRATORY: No shortness of breath, cough or sputum.  GASTROINTESTINAL: No anorexia, nausea, vomiting or diarrhea. No abdominal pain or blood.  GENITOURINARY: No burning on urination, no polyuria NEUROLOGICAL: No headache, dizziness, syncope, paralysis, ataxia, numbness or tingling in the extremities. No change in bowel or bladder control.  MUSCULOSKELETAL: No muscle, back pain, joint pain or stiffness.  LYMPHATICS: No enlarged nodes. No history of splenectomy.  PSYCHIATRIC: No history of depression or anxiety.  ENDOCRINOLOGIC: No reports of  sweating, cold or heat intolerance. No polyuria or polydipsia.  Marland Kitchen   Physical Examination Filed Vitals:   06/27/15 0833  BP: 118/84  Pulse: 63   Filed Vitals:   06/27/15 0833  Height: '5\' 4"'$  (1.626 m)  Weight: 181 lb (82.101 kg)    Gen: resting comfortably, no acute distress HEENT: no scleral icterus, pupils equal round and reactive, no palptable cervical adenopathy,  CV: RRR, 2/6 systolic murmur RUSB no jvd Resp: Clear to auscultation bilaterally GI: abdomen is soft, non-tender, non-distended, normal bowel sounds, no hepatosplenomegaly MSK: extremities are warm, no edema.  Skin: warm, no rash Neuro:  no focal deficits Psych: appropriate affect    Assessment and Plan  1. Chest pain - unclear etiology, she has CAD risk factors. With murmur on exam with initiate workup with echo, pending results would likely obtain an exercise nuclear stress test.  2. Heart murmur - obtain echo    F/u pending test results. Depending echo, will likely need exercise nuclear stress test.    Arnoldo Lenis, M.D.

## 2015-07-01 ENCOUNTER — Ambulatory Visit (HOSPITAL_COMMUNITY)
Admission: RE | Admit: 2015-07-01 | Discharge: 2015-07-01 | Disposition: A | Discharge status: Home / Self Care | Payer: BLUE CROSS/BLUE SHIELD | Source: Ambulatory Visit | Attending: Cardiology | Admitting: Cardiology

## 2015-07-01 DIAGNOSIS — R011 Cardiac murmur, unspecified: Secondary | ICD-10-CM | POA: Insufficient documentation

## 2015-07-01 DIAGNOSIS — I517 Cardiomegaly: Secondary | ICD-10-CM | POA: Diagnosis not present

## 2015-07-01 DIAGNOSIS — I350 Nonrheumatic aortic (valve) stenosis: Secondary | ICD-10-CM | POA: Insufficient documentation

## 2015-07-01 DIAGNOSIS — E785 Hyperlipidemia, unspecified: Secondary | ICD-10-CM | POA: Insufficient documentation

## 2015-07-01 DIAGNOSIS — I059 Rheumatic mitral valve disease, unspecified: Secondary | ICD-10-CM | POA: Diagnosis not present

## 2015-07-03 ENCOUNTER — Telehealth: Payer: Self-pay

## 2015-07-03 DIAGNOSIS — R079 Chest pain, unspecified: Secondary | ICD-10-CM

## 2015-07-10 ENCOUNTER — Encounter (HOSPITAL_COMMUNITY)
Admission: RE | Admit: 2015-07-10 | Discharge: 2015-07-10 | Disposition: A | Discharge status: Home / Self Care | Payer: BLUE CROSS/BLUE SHIELD | Source: Ambulatory Visit | Attending: Cardiology | Admitting: Cardiology

## 2015-07-10 ENCOUNTER — Inpatient Hospital Stay (HOSPITAL_COMMUNITY): Admission: RE | Admit: 2015-07-10 | Payer: BLUE CROSS/BLUE SHIELD | Source: Ambulatory Visit

## 2015-07-10 ENCOUNTER — Encounter (HOSPITAL_COMMUNITY): Payer: Self-pay

## 2015-07-10 DIAGNOSIS — R079 Chest pain, unspecified: Secondary | ICD-10-CM | POA: Insufficient documentation

## 2015-07-10 HISTORY — DX: Unspecified asthma, uncomplicated: J45.909

## 2015-07-10 LAB — NM MYOCAR MULTI W/SPECT W/WALL MOTION / EF
CHL CUP MPHR: 158 {beats}/min
CHL CUP NUCLEAR SDS: 6
CHL CUP NUCLEAR SRS: 4
CHL CUP NUCLEAR SSS: 10
CHL CUP RESTING HR STRESS: 87 {beats}/min
CSEPEDS: 0 s
Estimated workload: 4.6 METS
Exercise duration (min): 4 min
LV sys vol: 10 mL
LVDIAVOL: 40 mL (ref 46–106)
Peak HR: 153 {beats}/min
Percent HR: 96 %
RATE: 0.26
RPE: 15
TID: 0.87

## 2015-07-10 MED ORDER — TECHNETIUM TC 99M TETROFOSMIN IV KIT
10.0000 | PACK | Freq: Once | INTRAVENOUS | Status: AC | PRN
  Administered 2015-07-10: 10 via INTRAVENOUS

## 2015-07-10 MED ORDER — TECHNETIUM TC 99M TETROFOSMIN IV KIT
30.0000 | PACK | Freq: Once | INTRAVENOUS | Status: AC | PRN
  Administered 2015-07-10: 29.9 via INTRAVENOUS

## 2015-08-07 ENCOUNTER — Encounter: Payer: Self-pay | Admitting: Cardiology

## 2015-08-07 ENCOUNTER — Ambulatory Visit (INDEPENDENT_AMBULATORY_CARE_PROVIDER_SITE_OTHER): Payer: BLUE CROSS/BLUE SHIELD | Admitting: Cardiology

## 2015-08-07 VITALS — BP 138/80 | HR 99 | Ht 65.0 in | Wt 189.0 lb

## 2015-08-07 DIAGNOSIS — I350 Nonrheumatic aortic (valve) stenosis: Secondary | ICD-10-CM | POA: Diagnosis not present

## 2015-08-07 DIAGNOSIS — R079 Chest pain, unspecified: Secondary | ICD-10-CM

## 2015-08-07 NOTE — Patient Instructions (Signed)
Medication Instructions:  Your physician recommends that you continue on your current medications as directed. Please refer to the Current Medication list given to you today.   Labwork: I will reqwuest labs from pcp  Testing/Procedures: none  Follow-Up: Your physician wants you to follow-up in: 1 year . You will receive a reminder letter in the mail two months in advance. If you don't receive a letter, please call our office to schedule the follow-up appointment.   Any Other Special Instructions Will Be Listed Below (If Applicable).     If you need a refill on your cardiac medications before your next appointment, please call your pharmacy.

## 2015-08-07 NOTE — Progress Notes (Signed)
Clinical Summary Emily Rocha is a 60 y.o.female seen today for follow up of the following medical problems.   1. Chest pain - she reported episodes of chest pain last visit - since last visit completed echo with LVEF 60-65%, no WMAs, grade I diastolic dysfunction - 0/3474 stress test showed probable basal septal attenuation as opposed to ischemia. Nonspecific ST/T changes in setting of hypertensive response . Overall low risk findings.  - no recurrent symptoms  2. Carotid stenosis - followed by vascular, s/p right CEA  3. Mild aortic stenosis - denies any symptoms  Past Medical History  Diagnosis Date  . COPD (chronic obstructive pulmonary disease) ([place])   . Shortness of breath dyspnea   . GERD (gastroesophageal reflux disease)   . Asthma      Allergies  Allergen Reactions  . Morphine And Related Other (See Comments)    Stated broke out in sweat, felt faint, and BP dropped     Current Outpatient Prescriptions  Medication Sig Dispense Refill  . ALPRAZolam (XANAX) 0.5 MG tablet Take 0.5 mg by mouth as needed for anxiety (take one (1) TABLET one hour po before test).    Marland Kitchen aspirin EC 81 MG tablet Take 81 mg by mouth daily.    Marland Kitchen atorvastatin (LIPITOR) 10 MG tablet Take 1 tablet (10 mg total) by mouth daily. 30 tablet 11  . Cholecalciferol (VITAMIN D PO) Take 1 tablet by mouth daily.    Marland Kitchen oxyCODONE (OXY IR/ROXICODONE) 5 MG immediate release tablet Take 1-2 tablets (5-10 mg total) by mouth every 6 (six) hours as needed for moderate pain. 30 tablet 0  . pantoprazole (PROTONIX) 40 MG tablet Take 1 tablet by mouth daily.  0  . SYMBICORT 160-4.5 MCG/ACT inhaler Inhale 2 puffs into the lungs daily.  0  . VENTOLIN HFA 108 (90 BASE) MCG/ACT inhaler Inhale 2 puffs into the lungs every 6 (six) hours as needed.  0   No current facility-administered medications for this visit.     Past Surgical History  Procedure Laterality Date  . Cholecystectomy    . Colonoscopy N/A 10/03/2014    Procedure: COLONOSCOPY;  Surgeon: Rogene Houston, MD;  [facility];  Service: Endoscopy;  Laterality: N/A;  730  . Peripheral vascular catheterization N/A 12/05/2014    Procedure:  Carotid Arch Angiography;  Surgeon: Conrad Bell, MD;  Location: [facility];  Service: Cardiovascular;  Laterality: N/A;  . Endarterectomy Right 12/06/2014    Procedure: ENDARTERECTOMY CAROTid;  Surgeon: Elam Dutch, MD;  Location: [place] [facility];  Service: Vascular;  Laterality: Right;     Allergies  Allergen Reactions  . Morphine And Related Other (See Comments)    Stated broke out in sweat, felt faint, and BP dropped      Family History  Problem Relation Age of Onset  . Heart attack Mother   . Cancer Mother   . Diabetes Mother   . Heart disease Mother   . Heart attack Father   . Heart disease Father      Social History Ms. Glasner reports that she quit smoking about 4 years ago. She does not have any smokeless tobacco history on file. Ms. Tagliaferro reports that she does not drink alcohol.   Review of Systems CONSTITUTIONAL: No weight loss, fever, chills, weakness or fatigue.  HEENT: Eyes: No visual loss, blurred vision, double vision or yellow sclerae.No hearing loss, sneezing, congestion, runny nose or sore throat.  SKIN: No rash  or itching.  CARDIOVASCULAR: per HPI RESPIRATORY: No shortness of breath, cough or sputum.  GASTROINTESTINAL: No anorexia, nausea, vomiting or diarrhea. No abdominal pain or blood.  GENITOURINARY: No burning on urination, no polyuria NEUROLOGICAL: No headache, dizziness, syncope, paralysis, ataxia, numbness or tingling in the extremities. No change in bowel or bladder control.  MUSCULOSKELETAL: No muscle, back pain, joint pain or stiffness.  LYMPHATICS: No enlarged nodes. No history of splenectomy.  PSYCHIATRIC: No history of depression or anxiety.  ENDOCRINOLOGIC: No reports of sweating, cold or heat intolerance. No polyuria or polydipsia.   Marland Kitchen   Physical Examination Filed Vitals:   08/07/15 1544  BP: 138/80  Pulse: 99   Filed Vitals:   08/07/15 1544  Height: '5\' 5"'$  (1.651 m)  Weight: 189 lb (85.73 kg)    Gen: resting comfortably, no acute distress HEENT: no scleral icterus, pupils equal round and reactive, no palptable cervical adenopathy,  CV: RRR, no m/r/g, no jvd Resp: Clear to auscultation bilaterally GI: abdomen is soft, non-tender, non-distended, normal bowel sounds, no hepatosplenomegaly MSK: extremities are warm, no edema.  Skin: warm, no rash Neuro:  no focal deficits Psych: appropriate affect   Diagnostic Studies 06/2015 echo Study Conclusions  - Left ventricle: The cavity size was normal. Wall thickness was  increased in a pattern of mild concentric LVH, with moderate  focal basal septal hypertrophy. Systolic function was normal. The  estimated ejection fraction was in the range of 60% to 65%. Wall  motion was normal; there were no regional wall motion  abnormalities. Doppler parameters are consistent with abnormal  left ventricular relaxation (grade 1 diastolic dysfunction).  Indeterminate filling pressures. - Aortic valve: Mildly to moderately calcified annulus. Mildly  thickened, mildly calcified leaflets. There was mild stenosis.  Peak velocity (S): 231 cm/s. Mean gradient (S): 9 mm Hg. Valve  area (VTI): 1.69 cm^2. Valve area (Vmean): 1.69 cm^2. - Mitral valve: Mildly calcified annulus. Normal thickness leaflets  .  06/2015 Stress MPI  Equivocal ST segment depression, 0.5 mm in leads II, III, aVF, and V4 through V6. Resolved quickly in recovery. Intermediate risk Duke treadmill score of 1.5 based on limited exercise time and nondiagnostic ST segment change.  Blood pressure demonstrated a hypertensive response to exercise.  Small, mild intensity, basal septal defect that exhibits partial reversibility, suspect related to variable soft tissue attenuation rather than  ischemia.  This is a low risk study based on perfusion imaging.  Nuclear stress EF: 75%.   Assessment and Plan   1. Chest pain - recent negative stress test - symptoms have resolved - continue to monitor  2. Mild aortic stenosis - no recent symptoms. Continue to monitor   F/u 1 year     Arnoldo Lenis, M.D.

## 2015-09-24 ENCOUNTER — Other Ambulatory Visit (HOSPITAL_COMMUNITY): Payer: Self-pay | Admitting: Internal Medicine

## 2015-09-24 DIAGNOSIS — Z1231 Encounter for screening mammogram for malignant neoplasm of breast: Secondary | ICD-10-CM

## 2015-09-29 ENCOUNTER — Ambulatory Visit (HOSPITAL_COMMUNITY)
Admission: RE | Admit: 2015-09-29 | Discharge: 2015-09-29 | Disposition: A | Discharge status: Home / Self Care | Payer: BLUE CROSS/BLUE SHIELD | Source: Ambulatory Visit | Attending: Internal Medicine | Admitting: Internal Medicine

## 2015-09-29 DIAGNOSIS — Z1231 Encounter for screening mammogram for malignant neoplasm of breast: Secondary | ICD-10-CM | POA: Diagnosis present

## 2015-11-07 ENCOUNTER — Encounter: Payer: Self-pay | Admitting: Family

## 2015-11-13 ENCOUNTER — Ambulatory Visit (INDEPENDENT_AMBULATORY_CARE_PROVIDER_SITE_OTHER): Payer: BLUE CROSS/BLUE SHIELD | Admitting: Family

## 2015-11-13 ENCOUNTER — Encounter: Payer: Self-pay | Admitting: Family

## 2015-11-13 ENCOUNTER — Ambulatory Visit (HOSPITAL_COMMUNITY)
Admission: RE | Admit: 2015-11-13 | Discharge: 2015-11-13 | Disposition: A | Discharge status: Home / Self Care | Payer: BLUE CROSS/BLUE SHIELD | Source: Ambulatory Visit | Attending: Vascular Surgery | Admitting: Vascular Surgery

## 2015-11-13 VITALS — BP 139/85 | HR 84 | Temp 97.4°F | Resp 20 | Ht 65.0 in | Wt 184.0 lb

## 2015-11-13 DIAGNOSIS — Z87891 Personal history of nicotine dependence: Secondary | ICD-10-CM

## 2015-11-13 DIAGNOSIS — Z9889 Other specified postprocedural states: Secondary | ICD-10-CM | POA: Diagnosis not present

## 2015-11-13 DIAGNOSIS — I6523 Occlusion and stenosis of bilateral carotid arteries: Secondary | ICD-10-CM

## 2015-11-13 LAB — VAS US CAROTID
LCCAPSYS: 118 cm/s
LEFT ECA DIAS: -29 cm/s
Left CCA dist dias: -25 cm/s
Left CCA dist sys: -93 cm/s
Left CCA prox dias: 28 cm/s
Left ICA dist dias: -27 cm/s
Left ICA dist sys: -86 cm/s
Left ICA prox dias: 32 cm/s
Left ICA prox sys: 88 cm/s
RCCADSYS: -95 cm/s
RCCAPDIAS: 30 cm/s
RCCAPSYS: 150 cm/s
RIGHT CCA MID DIAS: 17 cm/s
RIGHT ECA DIAS: -46 cm/s

## 2015-11-13 NOTE — Patient Instructions (Signed)
Stroke Prevention Some medical conditions and behaviors are associated with an increased chance of having a stroke. You may prevent a stroke by making healthy choices and managing medical conditions. HOW CAN I REDUCE MY RISK OF HAVING A STROKE?   Stay physically active. Get at least 30 minutes of activity on most or all days.  Do not smoke. It may also be helpful to avoid exposure to secondhand smoke.  Limit alcohol use. Moderate alcohol use is considered to be:  No more than 2 drinks per day for men.  No more than 1 drink per day for nonpregnant women.  Eat healthy foods. This involves:  Eating 5 or more servings of fruits and vegetables a day.  Making dietary changes that address high blood pressure (hypertension), high cholesterol, diabetes, or obesity.  Manage your cholesterol levels.  Making food choices that are high in fiber and low in saturated fat, trans fat, and cholesterol may control cholesterol levels.  Take any prescribed medicines to control cholesterol as directed by your health care provider.  Manage your diabetes.  Controlling your carbohydrate and sugar intake is recommended to manage diabetes.  Take any prescribed medicines to control diabetes as directed by your health care provider.  Control your hypertension.  Making food choices that are low in salt (sodium), saturated fat, trans fat, and cholesterol is recommended to manage hypertension.  Ask your health care provider if you need treatment to lower your blood pressure. Take any prescribed medicines to control hypertension as directed by your health care provider.  If you are 18-39 years of age, have your blood pressure checked every 3-5 years. If you are 40 years of age or older, have your blood pressure checked every year.  Maintain a healthy weight.  Reducing calorie intake and making food choices that are low in sodium, saturated fat, trans fat, and cholesterol are recommended to manage  weight.  Stop drug abuse.  Avoid taking birth control pills.  Talk to your health care provider about the risks of taking birth control pills if you are over 35 years old, smoke, get migraines, or have ever had a blood clot.  Get evaluated for sleep disorders (sleep apnea).  Talk to your health care provider about getting a sleep evaluation if you snore a lot or have excessive sleepiness.  Take medicines only as directed by your health care provider.  For some people, aspirin or blood thinners (anticoagulants) are helpful in reducing the risk of forming abnormal blood clots that can lead to stroke. If you have the irregular heart rhythm of atrial fibrillation, you should be on a blood thinner unless there is a good reason you cannot take them.  Understand all your medicine instructions.  Make sure that other conditions (such as anemia or atherosclerosis) are addressed. SEEK IMMEDIATE MEDICAL CARE IF:   You have sudden weakness or numbness of the face, arm, or leg, especially on one side of the body.  Your face or eyelid droops to one side.  You have sudden confusion.  You have trouble speaking (aphasia) or understanding.  You have sudden trouble seeing in one or both eyes.  You have sudden trouble walking.  You have dizziness.  You have a loss of balance or coordination.  You have a sudden, severe headache with no known cause.  You have new chest pain or an irregular heartbeat. Any of these symptoms may represent a serious problem that is an emergency. Do not wait to see if the symptoms will   go away. Get medical help at once. Call your local emergency services (911 in [place] Do not drive yourself to the hospital.   This information is not intended to replace advice given to you by your health care provider. Make sure you discuss any questions you have with your health care provider.   Document Released: 03/11/2004 Document Revised: 02/22/2014 Document Reviewed:  08/04/2012 Elsevier Interactive Patient Education 2016 Elsevier Inc.  

## 2015-11-13 NOTE — Progress Notes (Signed)
Chief Complaint: Follow up Extracranial Carotid Artery Stenosis   History of Present Illness  Emily Rocha is a 60 y.o. female patient of Dr. Oneida Alar who is s/p Right CEA on 12/06/14. She had several preoperative TIA's for 6 months as manifested by left hemiparesis.  She c/o "feeling fuzzy headed and dizzy" intermittently since about January 2017, not associated with any activity. She denies any lateralizing sx's: denies recent monocular loss of vision, hemiparesis, or speach difficulties. She reports 4-5 episodes of numbness in her left leg which started about Jul 27, 2015, has happened less since tax season is over, happens only when she is sitting for long periods of time, not walking.    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 Quickly looking from computer screen to documents multiple times/day.  Pt denies any cardiac issues. Had a recent stress test in 2015/07/27 since both parents died from CAD, states her result was normal.   Pt Diabetic: no Pt smoker: former smoker, quit in 2013, on low dose nicotine vapor product since then  Pt meds include: Statin : yes ASA: yes Other anticoagulants/antiplatelets: no   Past Medical History:  Diagnosis Date  . Asthma   . COPD (chronic obstructive pulmonary disease) ([place])   . GERD (gastroesophageal reflux disease)   . Shortness of breath dyspnea     Social History Social History  Substance Use Topics  . Smoking status: Former Smoker    Packs/day: 2.00    Years: 40.00    Quit date: 10/03/2010  . Smokeless tobacco: Not on file     Comment: using a vapor cigarette @ 6 mg Nicotine  . Alcohol use No    Family History Family History  Problem Relation Age of Onset  . Heart attack Mother   . Cancer Mother   . Diabetes Mother   . Heart disease Mother   . Heart attack Father   . Heart disease Father     Surgical History Past Surgical History:  Procedure Laterality Date  . CHOLECYSTECTOMY    . COLONOSCOPY  N/A 10/03/2014   Procedure: COLONOSCOPY;  Surgeon: Rogene Houston, MD;  [facility];  Service: Endoscopy;  Laterality: N/A;  730  . ENDARTERECTOMY Right 12/06/2014   Procedure: ENDARTERECTOMY CAROTid;  Surgeon: Elam Dutch, MD;  Location: [place];  Service: Vascular;  Laterality: Right;  . PERIPHERAL VASCULAR CATHETERIZATION N/A 12/05/2014   Procedure:  Carotid Arch Angiography;  Surgeon: Conrad [place], MD;  Location: [facility];  Service: Cardiovascular;  Laterality: N/A;    Allergies  Allergen Reactions  . Morphine And Related Other (See Comments)    Stated broke out in sweat, felt faint, and BP dropped    Current Outpatient Prescriptions  Medication Sig Dispense Refill  . ALPRAZolam (XANAX) 0.5 MG tablet Take 0.5 mg by mouth as needed for anxiety (take one (1) TABLET one hour po before test).    Marland Kitchen aspirin EC 81 MG tablet Take 81 mg by mouth daily.    Marland Kitchen atorvastatin (LIPITOR) 10 MG tablet Take 1 tablet (10 mg total) by mouth daily. 30 tablet 11  . Cholecalciferol (VITAMIN D PO) Take 1 tablet by mouth daily.    . Multiple Vitamin (MULTIVITAMIN WITH MINERALS) TABS tablet Take 1 tablet by mouth daily.    . pantoprazole (PROTONIX) 40 MG tablet Take 1 tablet by mouth daily.  0  . SYMBICORT 160-4.5 MCG/ACT inhaler Inhale 2 puffs into  the lungs daily.  0  . VENTOLIN HFA 108 (90 BASE) MCG/ACT inhaler Inhale 2 puffs into the lungs every 6 (six) hours as needed.  0   No current facility-administered medications for this visit.     Review of Systems : See HPI for pertinent positives and negatives.  Physical Examination  Vitals:   11/13/15 1157 11/13/15 1159  BP: 126/78 139/85  Pulse: 84   Resp: 20   Temp: 97.4 F (36.3 C)   TempSrc: Oral   SpO2: 97%   Weight: 184 lb (83.5 kg)   Height: '5\' 5"'$  (1.651 m)    Body mass index is 30.62 kg/m.  General: WDWN obese female in NAD; central obesity GAIT: normal Eyes: PERRLA Pulmonary:  Non-labored respirations,  CTAB  Cardiac: regular rhythm, no detected murmur.  VASCULAR EXAM Carotid Bruits Right Left   positive Negative    Aorta is not palpable. Radial pulses are 2+ palpable and equal.                                                                                                                                          LE Pulses Right Left       POPLITEAL   not palpable  not palpable       POSTERIOR TIBIAL  faintly palpable   palpable       DORSALIS PEDIS      ANTERIOR TIBIAL  palpable  palpable    Gastrointestinal: soft, nontender, BS WNL, no r/g,  no palpable masses.  Musculoskeletal: no muscle atrophy/wasting. M/S 5/5 throughout, extremities without ischemic changes.  Neurologic: A&O X 3; Appropriate Affect, Speech is normal CN 2-12 intact, pain and light touch intact in extremities, Motor exam as listed above.    Assessment: Emily Rocha is a 60 y.o. female who is s/p Right CEA on 12/06/14. She had several preoperative TIA's for 6 months as manifested by left hemiparesis.  She has not had subsequent TIA's or stroke.  DATA Today's carotid duplex suggests right CEA site with no restenosis and left ICA with <40% stenosis. Bilateral ECA's with >50% stenosis. Bilateral vertebral arteries and subclavian arteries are normal.  No signficant change since exam on 05/08/15.  Plan: Follow-up in 1 year with Carotid Duplex scan.   I discussed in depth with the patient the nature of atherosclerosis, and emphasized the importance of maximal medical management including strict control of blood pressure, blood glucose, and lipid levels, obtaining regular exercise, and continued cessation of smoking.  The patient is aware that without maximal medical management the underlying atherosclerotic disease process will progress, limiting the benefit of any interventions. The patient was given information about stroke prevention and what symptoms should prompt the patient to seek immediate  medical care. Thank you for allowing [place] to participate in this patient's care.  Vinnie Level Carlester Kasparek, RN, MSN, FNP-C [facility]:  [phone]  Clinic Physician: Bridgett Larsson  11/13/15 12:02 PM

## 2015-11-18 NOTE — Addendum Note (Signed)
Addended by: Kaleen Mask on: 11/18/2015 11:40 AM   Modules accepted: Orders

## 2016-06-01 ENCOUNTER — Encounter: Payer: Self-pay | Admitting: Cardiology

## 2016-08-31 ENCOUNTER — Other Ambulatory Visit (HOSPITAL_COMMUNITY): Payer: Self-pay | Admitting: Internal Medicine

## 2016-08-31 DIAGNOSIS — Z1231 Encounter for screening mammogram for malignant neoplasm of breast: Secondary | ICD-10-CM

## 2016-09-30 ENCOUNTER — Ambulatory Visit (HOSPITAL_COMMUNITY)
Admission: RE | Admit: 2016-09-30 | Discharge: 2016-09-30 | Disposition: A | Discharge status: Home / Self Care | Payer: BLUE CROSS/BLUE SHIELD | Source: Ambulatory Visit | Attending: Internal Medicine | Admitting: Internal Medicine

## 2016-09-30 DIAGNOSIS — Z1231 Encounter for screening mammogram for malignant neoplasm of breast: Secondary | ICD-10-CM | POA: Insufficient documentation

## 2016-11-17 ENCOUNTER — Encounter: Payer: Self-pay | Admitting: Family

## 2016-11-18 ENCOUNTER — Ambulatory Visit: Payer: BLUE CROSS/BLUE SHIELD | Admitting: Family

## 2016-11-18 ENCOUNTER — Encounter (HOSPITAL_COMMUNITY): Payer: BLUE CROSS/BLUE SHIELD

## 2016-11-19 ENCOUNTER — Ambulatory Visit (INDEPENDENT_AMBULATORY_CARE_PROVIDER_SITE_OTHER): Payer: BLUE CROSS/BLUE SHIELD | Admitting: Family

## 2016-11-19 ENCOUNTER — Ambulatory Visit (HOSPITAL_COMMUNITY)
Admission: RE | Admit: 2016-11-19 | Discharge: 2016-11-19 | Disposition: A | Discharge status: Home / Self Care | Payer: BLUE CROSS/BLUE SHIELD | Source: Ambulatory Visit | Attending: Family | Admitting: Family

## 2016-11-19 ENCOUNTER — Encounter: Payer: Self-pay | Admitting: Family

## 2016-11-19 VITALS — BP 119/80 | HR 76 | Temp 98.9°F | Resp 20 | Ht 65.0 in | Wt 188.2 lb

## 2016-11-19 DIAGNOSIS — Z9889 Other specified postprocedural states: Secondary | ICD-10-CM

## 2016-11-19 DIAGNOSIS — I6522 Occlusion and stenosis of left carotid artery: Secondary | ICD-10-CM | POA: Diagnosis not present

## 2016-11-19 DIAGNOSIS — I6523 Occlusion and stenosis of bilateral carotid arteries: Secondary | ICD-10-CM | POA: Diagnosis not present

## 2016-11-19 DIAGNOSIS — Z87891 Personal history of nicotine dependence: Secondary | ICD-10-CM

## 2016-11-19 LAB — VAS US CAROTID
LCCADDIAS: -21 cm/s
LCCADSYS: -84 cm/s
LCCAPDIAS: 18 cm/s
LCCAPSYS: 100 cm/s
LEFT ECA DIAS: -21 cm/s
LEFT VERTEBRAL DIAS: 17 cm/s
LICADSYS: -68 cm/s
Left ICA dist dias: -21 cm/s
Left ICA prox dias: -15 cm/s
Left ICA prox sys: -46 cm/s
RCCADSYS: -76 cm/s
RCCAPSYS: 113 cm/s
RIGHT CCA MID DIAS: 6 cm/s
RIGHT ECA DIAS: -22 cm/s
Right CCA prox dias: 17 cm/s

## 2016-11-19 NOTE — Patient Instructions (Addendum)
Stroke Prevention Some medical conditions and behaviors are associated with an increased chance of having a stroke. You may prevent a stroke by making healthy choices and managing medical conditions. How can I reduce my risk of having a stroke?  Stay physically active. Get at least 30 minutes of activity on most or all days.  Do not smoke. It may also be helpful to avoid exposure to secondhand smoke.  Limit alcohol use. Moderate alcohol use is considered to be: ? No more than 2 drinks per day for men. ? No more than 1 drink per day for nonpregnant women.  Eat healthy foods. This involves: ? Eating 5 or more servings of fruits and vegetables a day. ? Making dietary changes that address high blood pressure (hypertension), high cholesterol, diabetes, or obesity.  Manage your cholesterol levels. ? Making food choices that are high in fiber and low in saturated fat, trans fat, and cholesterol may control cholesterol levels. ? Take any prescribed medicines to control cholesterol as directed by your health care provider.  Manage your diabetes. ? Controlling your carbohydrate and sugar intake is recommended to manage diabetes. ? Take any prescribed medicines to control diabetes as directed by your health care provider.  Control your hypertension. ? Making food choices that are low in salt (sodium), saturated fat, trans fat, and cholesterol is recommended to manage hypertension. ? Ask your health care provider if you need treatment to lower your blood pressure. Take any prescribed medicines to control hypertension as directed by your health care provider. ? If you are 18-39 years of age, have your blood pressure checked every 3-5 years. If you are 40 years of age or older, have your blood pressure checked every year.  Maintain a healthy weight. ? Reducing calorie intake and making food choices that are low in sodium, saturated fat, trans fat, and cholesterol are recommended to manage  weight.  Stop drug abuse.  Avoid taking birth control pills. ? Talk to your health care provider about the risks of taking birth control pills if you are over 35 years old, smoke, get migraines, or have ever had a blood clot.  Get evaluated for sleep disorders (sleep apnea). ? Talk to your health care provider about getting a sleep evaluation if you snore a lot or have excessive sleepiness.  Take medicines only as directed by your health care provider. ? For some people, aspirin or blood thinners (anticoagulants) are helpful in reducing the risk of forming abnormal blood clots that can lead to stroke. If you have the irregular heart rhythm of atrial fibrillation, you should be on a blood thinner unless there is a good reason you cannot take them. ? Understand all your medicine instructions.  Make sure that other conditions (such as anemia or atherosclerosis) are addressed. Get help right away if:  You have sudden weakness or numbness of the face, arm, or leg, especially on one side of the body.  Your face or eyelid droops to one side.  You have sudden confusion.  You have trouble speaking (aphasia) or understanding.  You have sudden trouble seeing in one or both eyes.  You have sudden trouble walking.  You have dizziness.  You have a loss of balance or coordination.  You have a sudden, severe headache with no known cause.  You have new chest pain or an irregular heartbeat. Any of these symptoms may represent a serious problem that is an emergency. Do not wait to see if the symptoms will go away.   Get medical help at once. Call your local emergency services (911 in [place] Do not drive yourself to the hospital. This information is not intended to replace advice given to you by your health care provider. Make sure you discuss any questions you have with your health care provider. Document Released: 03/11/2004 Document Revised: 07/10/2015 Document Reviewed: 08/04/2012 Elsevier  Interactive Patient Education  2017 Elsevier Inc.     Preventing Cerebrovascular Disease Arteries are blood vessels that carry blood that contains oxygen from the heart to all parts of the body. Cerebrovascular disease affects arteries that supply the brain. Any condition that blocks or disrupts blood flow to the brain can cause cerebrovascular disease. Brain cells that lose blood supply start to die within minutes (stroke). Stroke is the main danger of cerebrovascular disease. Atherosclerosis and high blood pressure are common causes of cerebrovascular disease. Atherosclerosis is narrowing and hardening of an artery that results when fat, cholesterol, calcium, or other substances (plaque) build up inside an artery. Plaque reduces blood flow through the artery. High blood pressure increases the risk of bleeding inside the brain. Making diet and lifestyle changes to prevent atherosclerosis and high blood pressure lowers your risk of cerebrovascular disease. What nutrition changes can be made?  Eat more fruits, vegetables, and whole grains.  Reduce how much saturated fat you eat. To do this, eat less red meat and fewer full-fat dairy products.  Eat healthy proteins instead of red meat. Healthy proteins include: ? Fish. Eat fish that contains heart-healthy omega-3 fatty acids, twice a week. Examples include salmon, albacore tuna, mackerel, and herring. ? Chicken. ? Nuts. ? Low-fat or nonfat yogurt.  Avoid processed meats, like bacon and lunchmeat.  Avoid foods that contain: ? A lot of sugar, such as sweets and drinks with added sugar. ? A lot of salt (sodium). Avoid adding extra salt to your food, as told by your health care provider. ? Trans fats, such as margarine and baked goods. Trans fats may be listed as "partially hydrogenated oils" on food labels.  Check food labels to see how much sodium, sugar, and trans fats are in foods.  Use vegetable oils that contain low amounts of  saturated fat, such as olive oil or canola oil. What lifestyle changes can be made?  Drink alcohol in moderation. This means no more than 1 drink a day for nonpregnant women and 2 drinks a day for men. One drink equals 12 oz of beer, 5 oz of wine, or 1 oz of hard liquor.  If you are overweight, ask your health care provider to recommend a weight-loss plan for you. Losing 5-10 lb (2.2-4.5 kg) can reduce your risk of diabetes, atherosclerosis, and high blood pressure.  Exercise for 30?60 minutes on most days, or as much as told by your health care provider. ? Do moderate-intensity exercise, such as brisk walking, bicycling, and water aerobics. Ask your health care provider which activities are safe for you.  Do not use any products that contain nicotine or tobacco, such as cigarettes and e-cigarettes. If you need help quitting, ask your health care provider. Why are these changes important? Making these changes lowers your risk of many diseases that can cause cerebrovascular disease and stroke. Stroke is a leading cause of death and disability. Making these changes also improves your overall health and quality of life. What can I do to lower my risk? The following factors make you more likely to develop cerebrovascular disease:  Being overweight.  Smoking.  Being physically inactive.    Eating a high-fat diet.  Having certain health conditions, such as: ? Diabetes. ? High blood pressure. ? Heart disease. ? Atherosclerosis. ? High cholesterol. ? Sickle cell disease.  Talk with your health care provider about your risk for cerebrovascular disease. Work with your health care provider to control diseases that you have that may contribute to cerebrovascular disease. Your health care provider may prescribe medicines to help prevent major causes of cerebrovascular disease. Where to find more information: Learn more about preventing cerebrovascular disease from:  National Heart, Lung, and  Blood Institute: www.nhlbi.nih.gov/health/health-topics/topics/stroke  Centers for Disease Control and Prevention: cdc.gov/stroke/about.htm  Summary  Cerebrovascular disease can lead to a stroke.  Atherosclerosis and high blood pressure are major causes of cerebrovascular disease.  Making diet and lifestyle changes can reduce your risk of cerebrovascular disease.  Work with your health care provider to get your risk factors under control to reduce your risk of cerebrovascular disease. This information is not intended to replace advice given to you by your health care provider. Make sure you discuss any questions you have with your health care provider. Document Released: 02/16/2015 Document Revised: 08/22/2015 Document Reviewed: 02/16/2015 Elsevier Interactive Patient Education  2018 Elsevier Inc.  

## 2016-11-19 NOTE — Progress Notes (Signed)
Chief Complaint: Follow up Extracranial Carotid Artery Stenosis   History of Present Illness  Emily Rocha is a 61 y.o. female patient of Dr. Harrell Lark s/p Right CEA on 12/06/14. She had several preoperative TIA's for 6 months as manifested by left hemiparesis.  She denies any lateralizing sx's: denies monocular loss of vision, hemiparesis, or speach difficulties.  Pt denies any cardiac issues. Had a stress test in 2015-07-03 since both parents died from CAD, states her result was normal.   She will be seeing Dr. Laural Golden, GI, for severe GERD sx's and blood in her stool.   Pt Diabetic: no Pt smoker: former smoker, quit in 2013, on low dose nicotine vapor product since then, then quit that in August 2018  Pt meds include: Statin : yes ASA: yes Other anticoagulants/antiplatelets: no    Past Medical History:  Diagnosis Date  . Asthma   . COPD (chronic obstructive pulmonary disease) ([place])   . GERD (gastroesophageal reflux disease)   . Shortness of breath dyspnea     Social History Social History  Substance Use Topics  . Smoking status: Former Smoker    Packs/day: 2.00    Years: 40.00    Quit date: 10/03/2010  . Smokeless tobacco: Never Used     Comment: No longer using the Vapor  . Alcohol use No    Family History Family History  Problem Relation Age of Onset  . Heart attack Mother   . Cancer Mother   . Diabetes Mother   . Heart disease Mother   . Heart attack Father   . Heart disease Father     Surgical History Past Surgical History:  Procedure Laterality Date  . CHOLECYSTECTOMY    . COLONOSCOPY N/A 10/03/2014   Procedure: COLONOSCOPY;  Surgeon: Rogene Houston, MD;  [facility];  Service: Endoscopy;  Laterality: N/A;  730  . ENDARTERECTOMY Right 12/06/2014   Procedure: ENDARTERECTOMY CAROTid;  Surgeon: Elam Dutch, MD;  Location: [place];  Service: Vascular;  Laterality: Right;  . PERIPHERAL VASCULAR CATHETERIZATION N/A 12/05/2014    Procedure:  Carotid Arch Angiography;  Surgeon: Conrad , MD;  Location: [facility];  Service: Cardiovascular;  Laterality: N/A;    Allergies  Allergen Reactions  . Morphine And Related Other (See Comments)    Stated broke out in sweat, felt faint, and BP dropped    Current Outpatient Prescriptions  Medication Sig Dispense Refill  . ALPRAZolam (XANAX) 0.5 MG tablet Take 0.5 mg by mouth as needed for anxiety (take one (1) TABLET one hour po before test).    Marland Kitchen aspirin EC 81 MG tablet Take 81 mg by mouth daily.    Marland Kitchen atorvastatin (LIPITOR) 10 MG tablet Take 1 tablet (10 mg total) by mouth daily. 30 tablet 11  . Cholecalciferol (VITAMIN D PO) Take 1 tablet by mouth daily.    . Multiple Vitamin (MULTIVITAMIN WITH MINERALS) TABS tablet Take 1 tablet by mouth daily.    . pantoprazole (PROTONIX) 40 MG tablet Take 1 tablet by mouth daily.  0  . SYMBICORT 160-4.5 MCG/ACT inhaler Inhale 2 puffs into the lungs daily.  0  . VENTOLIN HFA 108 (90 BASE) MCG/ACT inhaler Inhale 2 puffs into the lungs every 6 (six) hours as needed.  0   No current facility-administered medications for this visit.     Review of Systems : See HPI for pertinent positives and negatives.  Physical Examination  Vitals:   11/19/16 1151  11/19/16 1153  BP: 132/76 119/80  Pulse: 76   Resp: 20   Temp: 98.9 F (37.2 C)   TempSrc: Oral   SpO2: 94%   Weight: 188 lb 3.2 oz (85.4 kg)   Height: 5\' 5"  (1.651 m)    Body mass index is 31.32 kg/m.  General: WDWN obese female in NAD; central obesity GAIT:normal Eyes: PERRLA Pulmonary: Non-labored respirations, CTAB  Cardiac: regular rhythm, + murmur.  VASCULAR EXAM Carotid Bruits Right Left   positive Negative   Abdominal aortic pulse is not palpable. Radial pulses are 2+ palpable and equal.   LE Pulses Right Left  POPLITEAL not palpable not palpable  POSTERIOR TIBIAL faintly palpable palpable   DORSALIS PEDIS ANTERIOR TIBIAL palpable palpable    Gastrointestinal: soft, nontender, BS WNL, no r/g, no palpable masses.  Musculoskeletal: no muscle atrophy/wasting. M/S 5/5 throughout, extremities without ischemic changes.  Neurologic: A&O X 3; appropriate affect, speech is normal, CN 2-12 intact, pain and light touch intact in extremities, Motor exam as listed above.    Assessment: Emily Rocha is a 61 y.o. female whois s/p Right CEA on 12/06/14. She had several preoperative TIA's for 6 months as manifested by left hemiparesis.  She has not had subsequent TIA's or stroke.  DATA Carotid Duplex (11/19/16): Right ICA:  CEA site with no restenosis Left ICA : 1-39% stenosis. Bilateral vertebral artery flow is antegrade.  Bilateral subclavian artery waveforms are normal.  No signficant change since exams on 05/08/15 and 11/13/15.  Plan: Follow-up in 1 year with Carotid Duplex scan.   I discussed in depth with the patient the nature of atherosclerosis, and emphasized the importance of maximal medical management including strict control of blood pressure, blood glucose, and lipid levels, obtaining regular exercise, and continued cessation of smoking.  The patient is aware that without maximal medical management the underlying atherosclerotic disease process will progress, limiting the benefit of any interventions. The patient was given information about stroke prevention and what symptoms should prompt the patient to seek immediate medical care. Thank you for allowing [place] to participate in this patient's care.  Clemon Chambers, RN, MSN, FNP-C [facility] Office: [phone]  Clinic Physician: Cain/Chen  11/19/16 12:26 PM

## 2016-11-23 ENCOUNTER — Encounter (INDEPENDENT_AMBULATORY_CARE_PROVIDER_SITE_OTHER): Payer: Self-pay | Admitting: Internal Medicine

## 2016-11-24 ENCOUNTER — Encounter (INDEPENDENT_AMBULATORY_CARE_PROVIDER_SITE_OTHER): Payer: Self-pay | Admitting: Internal Medicine

## 2016-11-24 ENCOUNTER — Ambulatory Visit (INDEPENDENT_AMBULATORY_CARE_PROVIDER_SITE_OTHER): Payer: BLUE CROSS/BLUE SHIELD | Admitting: Internal Medicine

## 2016-11-24 ENCOUNTER — Encounter (INDEPENDENT_AMBULATORY_CARE_PROVIDER_SITE_OTHER): Payer: Self-pay | Admitting: *Deleted

## 2016-11-24 VITALS — BP 150/80 | HR 76 | Temp 98.1°F | Ht 64.0 in | Wt 185.8 lb

## 2016-11-24 DIAGNOSIS — R1310 Dysphagia, unspecified: Secondary | ICD-10-CM

## 2016-11-24 DIAGNOSIS — R1319 Other dysphagia: Secondary | ICD-10-CM

## 2016-11-24 DIAGNOSIS — R195 Other fecal abnormalities: Secondary | ICD-10-CM | POA: Diagnosis not present

## 2016-11-24 NOTE — Progress Notes (Signed)
Subjective:    Patient ID: Emily Rocha, female    DOB: October 02, 1955, 61 y.o.   MRN: [id number]  HPI Referred by Dr. Woody Seller for positive stool card. Last colonoscopy in 2016 by Dr. Laural Golden (average risk). Findings:   Prep satisfactory. Tortuous hepatic flexure. Ultra Slim scope was used to complete exam. Scattered diverticula at descending colon along with multiple diverticula at sigmoid colon. Normal rectal mucosa. 1 hemorrhoids below the dentate line to  She says she has noticed a change in her stool.  She says her stools are a lighter brown. She has been constipated.  She has been taking Phillip's laxative. She does have a BM every day and her stools are soft. The Phillip's has helped  She has not seen any blood in her stools.  She says when she lies down, it feels like her esophagus feels like it is squeezing her.  She has pain after she eats.  She has been taking Protonix for about 10 yrs. Her appetite is good.   She says foods feel like they are lodging in her esophagus.  Symptoms for 2 months.     11/08/2016 H and H 12.9 and 401.  Review of Systems  Past Medical History:  Diagnosis Date  . Asthma   . COPD (chronic obstructive pulmonary disease) ([place])   . GERD (gastroesophageal reflux disease)   . Shortness of breath dyspnea     Past Surgical History:  Procedure Laterality Date  . CHOLECYSTECTOMY    . COLONOSCOPY N/A 10/03/2014   Procedure: COLONOSCOPY;  Surgeon: Rogene Houston, MD;  [facility];  Service: Endoscopy;  Laterality: N/A;  730  . ENDARTERECTOMY Right 12/06/2014   Procedure: ENDARTERECTOMY CAROTid;  Surgeon: Elam Dutch, MD;  Location: [place];  Service: Vascular;  Laterality: Right;  . PERIPHERAL VASCULAR CATHETERIZATION N/A 12/05/2014   Procedure:  Carotid Arch Angiography;  Surgeon: Conrad Pasco, MD;  Location: [facility];  Service: Cardiovascular;  Laterality: N/A;    Allergies  Allergen Reactions  . Morphine And Related Other  (See Comments)    Stated broke out in sweat, felt faint, and BP dropped    Current Outpatient Prescriptions on File Prior to Visit  Medication Sig Dispense Refill  . ALPRAZolam (XANAX) 0.5 MG tablet Take 0.5 mg by mouth as needed for anxiety (take one (1) TABLET one hour po before test).    Marland Kitchen aspirin EC 81 MG tablet Take 81 mg by mouth daily.    Marland Kitchen atorvastatin (LIPITOR) 10 MG tablet Take 1 tablet (10 mg total) by mouth daily. 30 tablet 11  . Cholecalciferol (VITAMIN D PO) Take 1 tablet by mouth daily.    . Multiple Vitamin (MULTIVITAMIN WITH MINERALS) TABS tablet Take 1 tablet by mouth daily.    . pantoprazole (PROTONIX) 40 MG tablet Take 1 tablet by mouth daily.  0  . SYMBICORT 160-4.5 MCG/ACT inhaler Inhale 2 puffs into the lungs daily.  0  . VENTOLIN HFA 108 (90 BASE) MCG/ACT inhaler Inhale 2 puffs into the lungs every 6 (six) hours as needed.  0   No current facility-administered medications on file prior to visit.         Objective:   Physical Exam Blood pressure (!) 150/80, pulse 76, temperature 98.1 F (36.7 C), height 5\' 4"  (1.626 m), weight 185 lb 12.8 oz (84.3 kg). Alert and oriented. Skin warm and dry. Oral mucosa is moist.   . Sclera anicteric, conjunctivae is pink. Thyroid  not enlarged. No cervical lymphadenopathy. Lungs clear. Heart regular rate and rhythm.  Abdomen is soft. Bowel sounds are positive. No hepatomegaly. No abdominal masses felt. No tenderness.  No edema to lower extremities.   Stool brown and guaiac negative.          Assessment & Plan:  Guaiac positive stool. CBC, ferritin, iron.  I discussed with Dr Laural Golden.  Dysphagia: Am going to get and Esophagram.  Dysphagia: DG esphagram.

## 2016-11-24 NOTE — Patient Instructions (Addendum)
CBC, Iron ,Ferritin. DG esophagram.  Take a Zantac at night.

## 2016-11-25 LAB — CBC WITH DIFFERENTIAL/PLATELET
BASOS PCT: 0.2 %
Basophils Absolute: 19 cells/uL (ref 0–200)
EOS ABS: 0 {cells}/uL — AB (ref 15–500)
EOS PCT: 0 %
HCT: 42.4 % (ref 35.0–45.0)
HEMOGLOBIN: 14.1 g/dL (ref 11.7–15.5)
Lymphs Abs: 1172 cells/uL (ref 850–3900)
MCH: 28.5 pg (ref 27.0–33.0)
MCHC: 33.3 g/dL (ref 32.0–36.0)
MCV: 85.7 fL (ref 80.0–100.0)
MONOS PCT: 6.8 %
MPV: 11 fL (ref 7.5–12.5)
NEUTROS ABS: 7477 {cells}/uL (ref 1500–7800)
Neutrophils Relative %: 80.4 %
PLATELETS: 276 10*3/uL (ref 140–400)
RBC: 4.95 10*6/uL (ref 3.80–5.10)
RDW: 12.9 % (ref 11.0–15.0)
TOTAL LYMPHOCYTE: 12.6 %
WBC mixed population: 632 cells/uL (ref 200–950)
WBC: 9.3 10*3/uL (ref 3.8–10.8)

## 2016-11-25 LAB — FERRITIN: Ferritin: 16 ng/mL — ABNORMAL LOW (ref 20–288)

## 2016-11-25 LAB — IRON: IRON: 43 ug/dL — AB (ref 45–160)

## 2016-11-29 ENCOUNTER — Ambulatory Visit (HOSPITAL_COMMUNITY)
Admission: RE | Admit: 2016-11-29 | Discharge: 2016-11-29 | Disposition: A | Discharge status: Home / Self Care | Payer: BLUE CROSS/BLUE SHIELD | Source: Ambulatory Visit | Attending: Internal Medicine | Admitting: Internal Medicine

## 2016-11-29 DIAGNOSIS — K449 Diaphragmatic hernia without obstruction or gangrene: Secondary | ICD-10-CM | POA: Insufficient documentation

## 2016-11-29 DIAGNOSIS — R1310 Dysphagia, unspecified: Secondary | ICD-10-CM | POA: Diagnosis present

## 2016-11-29 DIAGNOSIS — R1319 Other dysphagia: Secondary | ICD-10-CM

## 2016-11-30 ENCOUNTER — Other Ambulatory Visit (INDEPENDENT_AMBULATORY_CARE_PROVIDER_SITE_OTHER): Payer: Self-pay | Admitting: Internal Medicine

## 2016-11-30 ENCOUNTER — Telehealth (INDEPENDENT_AMBULATORY_CARE_PROVIDER_SITE_OTHER): Payer: Self-pay | Admitting: Internal Medicine

## 2016-11-30 DIAGNOSIS — R1013 Epigastric pain: Secondary | ICD-10-CM | POA: Insufficient documentation

## 2016-11-30 DIAGNOSIS — D649 Anemia, unspecified: Secondary | ICD-10-CM | POA: Insufficient documentation

## 2016-11-30 DIAGNOSIS — D508 Other iron deficiency anemias: Secondary | ICD-10-CM

## 2016-11-30 NOTE — Telephone Encounter (Signed)
EGD sch'd 12/03/16 at 730 (630), patient aware, verbal instructions given

## 2016-11-30 NOTE — Telephone Encounter (Signed)
Emily Rocha, EGD this Friday. Dr. Laural Golden is aware.

## 2016-12-03 ENCOUNTER — Ambulatory Visit (HOSPITAL_COMMUNITY)
Admission: RE | Admit: 2016-12-03 | Discharge: 2016-12-03 | Disposition: A | Discharge status: Home / Self Care | Payer: BLUE CROSS/BLUE SHIELD | Source: Ambulatory Visit | Attending: Internal Medicine | Admitting: Internal Medicine

## 2016-12-03 ENCOUNTER — Encounter (HOSPITAL_COMMUNITY): Payer: Self-pay | Admitting: *Deleted

## 2016-12-03 ENCOUNTER — Encounter (HOSPITAL_COMMUNITY)
Admission: RE | Disposition: A | Discharge status: Home / Self Care | Payer: Self-pay | Source: Ambulatory Visit | Attending: Internal Medicine

## 2016-12-03 DIAGNOSIS — Z9049 Acquired absence of other specified parts of digestive tract: Secondary | ICD-10-CM | POA: Diagnosis not present

## 2016-12-03 DIAGNOSIS — R1013 Epigastric pain: Secondary | ICD-10-CM | POA: Insufficient documentation

## 2016-12-03 DIAGNOSIS — D508 Other iron deficiency anemias: Secondary | ICD-10-CM

## 2016-12-03 DIAGNOSIS — Z7982 Long term (current) use of aspirin: Secondary | ICD-10-CM | POA: Insufficient documentation

## 2016-12-03 DIAGNOSIS — R109 Unspecified abdominal pain: Secondary | ICD-10-CM | POA: Diagnosis not present

## 2016-12-03 DIAGNOSIS — J449 Chronic obstructive pulmonary disease, unspecified: Secondary | ICD-10-CM | POA: Diagnosis not present

## 2016-12-03 DIAGNOSIS — K210 Gastro-esophageal reflux disease with esophagitis: Secondary | ICD-10-CM | POA: Insufficient documentation

## 2016-12-03 DIAGNOSIS — R195 Other fecal abnormalities: Secondary | ICD-10-CM | POA: Insufficient documentation

## 2016-12-03 DIAGNOSIS — D649 Anemia, unspecified: Secondary | ICD-10-CM | POA: Insufficient documentation

## 2016-12-03 DIAGNOSIS — Z79899 Other long term (current) drug therapy: Secondary | ICD-10-CM | POA: Diagnosis not present

## 2016-12-03 DIAGNOSIS — K228 Other specified diseases of esophagus: Secondary | ICD-10-CM

## 2016-12-03 DIAGNOSIS — K449 Diaphragmatic hernia without obstruction or gangrene: Secondary | ICD-10-CM

## 2016-12-03 DIAGNOSIS — K219 Gastro-esophageal reflux disease without esophagitis: Secondary | ICD-10-CM

## 2016-12-03 DIAGNOSIS — Z87891 Personal history of nicotine dependence: Secondary | ICD-10-CM | POA: Insufficient documentation

## 2016-12-03 HISTORY — PX: ESOPHAGOGASTRODUODENOSCOPY: SHX5428

## 2016-12-03 LAB — HEPATIC FUNCTION PANEL
ALK PHOS: 57 U/L (ref 38–126)
ALT: 27 U/L (ref 14–54)
AST: 17 U/L (ref 15–41)
Albumin: 3.6 g/dL (ref 3.5–5.0)
Bilirubin, Direct: 0.1 mg/dL (ref 0.1–0.5)
Indirect Bilirubin: 0.8 mg/dL (ref 0.3–0.9)
TOTAL PROTEIN: 6.2 g/dL — AB (ref 6.5–8.1)
Total Bilirubin: 0.9 mg/dL (ref 0.3–1.2)

## 2016-12-03 SURGERY — EGD (ESOPHAGOGASTRODUODENOSCOPY)
Anesthesia: Moderate Sedation

## 2016-12-03 MED ORDER — FLINTSTONES PLUS IRON PO CHEW: 1.0000 | CHEWABLE_TABLET | Freq: Two times a day (BID) | ORAL | Status: DC

## 2016-12-03 MED ORDER — ESOMEPRAZOLE MAGNESIUM 40 MG PO CPDR: 40.0000 mg | DELAYED_RELEASE_CAPSULE | Freq: Two times a day (BID) | ORAL | 2 refills | Status: DC

## 2016-12-03 MED ORDER — STERILE WATER FOR IRRIGATION IR SOLN
Status: DC | PRN
  Administered 2016-12-03: 08:00:00

## 2016-12-03 MED ORDER — MEPERIDINE HCL 50 MG/ML IJ SOLN
INTRAMUSCULAR | Status: AC
  Filled 2016-12-03: qty 1

## 2016-12-03 MED ORDER — MIDAZOLAM HCL 5 MG/5ML IJ SOLN
INTRAMUSCULAR | Status: AC
  Filled 2016-12-03: qty 10

## 2016-12-03 MED ORDER — MIDAZOLAM HCL 5 MG/5ML IJ SOLN
INTRAMUSCULAR | Status: DC | PRN
  Administered 2016-12-03 (×2): 2 mg via INTRAVENOUS
  Administered 2016-12-03: 3 mg via INTRAVENOUS
  Administered 2016-12-03: 1 mg via INTRAVENOUS

## 2016-12-03 MED ORDER — SODIUM CHLORIDE 0.9 % IV SOLN
INTRAVENOUS | Status: DC
Start: 2016-12-03 — End: 2016-12-03
  Administered 2016-12-03: 07:00:00 via INTRAVENOUS

## 2016-12-03 MED ORDER — LIDOCAINE VISCOUS 2 % MT SOLN
OROMUCOSAL | Status: AC
  Filled 2016-12-03: qty 15

## 2016-12-03 MED ORDER — LIDOCAINE VISCOUS 2 % MT SOLN
OROMUCOSAL | Status: DC | PRN
  Administered 2016-12-03: 4 mL via OROMUCOSAL

## 2016-12-03 MED ORDER — MEPERIDINE HCL 50 MG/ML IJ SOLN
INTRAMUSCULAR | Status: DC | PRN
  Administered 2016-12-03 (×2): 25 mg via INTRAVENOUS

## 2016-12-03 NOTE — Discharge Instructions (Signed)
Discontinue pantoprazole. Resume other medications includin aspirin as before. Esomeprazole/Nexium 40 mg by mouth 30 minutes before breakfast and evening meal daily. Flintstone chewable with iron 1 tablet twice daily. Resume usual diet. No driving for 24 hours. Physician will call with results of blood tests.   Upper Endoscopy, Care After Refer to this sheet in the next few weeks. These instructions provide you with information about caring for yourself after your procedure. Your health care provider may also give you more specific instructions. Your treatment has been planned according to current medical practices, but problems sometimes occur. Call your health care provider if you have any problems or questions after your procedure. What can I expect after the procedure? After the procedure, it is common to have:  A sore throat.  Bloating.  Nausea.  Follow these instructions at home:  Follow instructions from your health care provider about what to eat or drink after your procedure.  Return to your normal activities as told by your health care provider. Ask your health care provider what activities are safe for you.  Take over-the-counter and prescription medicines only as told by your health care provider.  Do not drive for 24 hours if you received a sedative.  Keep all follow-up visits as told by your health care provider. This is important. Contact a health care provider if:  You have a sore throat that lasts longer than one day.  You have trouble swallowing. Get help right away if:  You have a fever.  You vomit blood or your vomit looks like coffee grounds.  You have bloody, black, or tarry stools.  You have a severe sore throat or you cannot swallow.  You have difficulty breathing.  You have severe pain in your chest or belly. This information is not intended to replace advice given to you by your health care provider. Make sure you discuss any questions you  have with your health care provider. Document Released: 08/03/2011 Document Revised: 07/10/2015 Document Reviewed: 11/14/2014 Elsevier Interactive Patient Education  2017 [employer].

## 2016-12-03 NOTE — H&P (Signed)
Emily Rocha is an 61 y.o. female.   Chief Complaint: Patient is here for EGD. HPI: patient is 61 year old Caucasian fle was chronic GERD maintained on PPI who presents with two-mistory of postprandial sharp epigastric pain. This pain occurs within 10 minutes of meals and may last for 30 mines. This pain is not associated with nausea or vomiting. She denies dysphagia. She also denies melena or rectal bleeding. She was noted to have heme-positive stool. However her hemoglobin was 14.1 g  last week.he had colonoscopy in August 2016. She is on low dose aspirin and takes Aleve occasionally.recent upper GI series revealed moderate size hiatal hernia.  Past Medical History:  Diagnosis Date  . Asthma   . COPD (chronic obstructive pulmonary disease) ([place])   . GERD (gastroesophageal reflux disease)   . Shortness of breath dyspnea     Past Surgical History:  Procedure Laterality Date  . CHOLECYSTECTOMY    . COLONOSCOPY N/A 10/03/2014   Procedure: COLONOSCOPY;  Surgeon: Rogene Houston, MD;  Location: [facility];  Service: Endoscopy;  Laterality: N/A;  730  . ENDARTERECTOMY Right 12/06/2014   Procedure: ENDARTERECTOMY CAROTid;  Surgeon: Elam Dutch, MD;  Location: [place];  Service: Vascular;  Laterality: Right;  . PERIPHERAL VASCULAR CATHETERIZATION N/A 12/05/2014   Procedure:  Carotid Arch Angiography;  Surgeon: Conrad St. Georges, MD;  Location: [facility];  Service: Cardiovascular;  Laterality: N/A;    Family History  Problem Relation Age of Onset  . Heart attack Mother   . Cancer Mother   . Diabetes Mother   . Heart disease Mother   . Heart attack Father   . Heart disease Father    Social History:  reports that she quit smoking about 6 years ago. She has a 80.00 pack-year smoking history. She has never used smokeless tobacco. She reports that she does not drink alcohol or use drugs.  Allergies:  Allergies  Allergen Reactions  . Morphine And Related Other (See Comments)     Stated broke out in sweat, felt faint, and BP dropped    Medications Prior to Admission  Medication Sig Dispense Refill  . ALPRAZolam (XANAX) 0.5 MG tablet Take 0.5 mg by mouth 2 (two) times daily as needed for anxiety.     Marland Kitchen aspirin EC 81 MG tablet Take 81 mg by mouth at bedtime.     Marland Kitchen atorvastatin (LIPITOR) 10 MG tablet Take 1 tablet (10 mg total) by mouth daily. 30 tablet 11  . Cholecalciferol (VITAMIN D) 2000 units CAPS Take 2,000 Units by mouth daily.    . Multiple Vitamin (MULTIVITAMIN WITH MINERALS) TABS tablet Take 1 tablet by mouth daily.    . pantoprazole (PROTONIX) 40 MG tablet Take 40 mg by mouth 2 (two) times daily.   0  . ranitidine (ZANTAC) 150 MG tablet Take 150 mg by mouth at bedtime.    . SYMBICORT 160-4.5 MCG/ACT inhaler Inhale 2 puffs into the lungs 2 (two) times daily.   0  . VENTOLIN HFA 108 (90 BASE) MCG/ACT inhaler Inhale 2 puffs into the lungs every 6 (six) hours as needed for shortness of breath.   0    No results found for this or any previous visit (from the past 48 hour(s)). No results found.  ROS  Blood pressure 138/75, pulse 75, temperature 97.7 F (36.5 C), temperature source Oral, resp. rate 15, height 5\' 4"  (1.626 m), weight 186 lb (84.4 kg), SpO2 95 %. Physical Exam  Constitutional: She  appears well-developed and well-nourished.  HENT:  Mouth/Throat: Oropharynx is clear and moist.  Eyes: Conjunctivae are normal. No scleral icterus.  Neck: No thyromegaly present.  Right neck scar  Cardiovascular: Normal rate, regular rhythm and normal heart sounds.   No murmur heard. Respiratory: Effort normal and breath sounds normal.  GI:  Abdomen is full. On palpation is soft and nontender without ganomegaly or masses.  Musculoskeletal: She exhibits no edema.  Lymphadenopathy:    She has no cervical adenopathy.  Neurological: She is alert.  Skin: Skin is warm and dry.     Assessment/Plan Epigastric pain and heme positive stool. Diagnostic   EGD.  Hildred Laser, MD 12/03/2016, 7:32 AM

## 2016-12-03 NOTE — Op Note (Signed)
[facility] Patient Name: Emily Rocha Procedure Date: 12/03/2016 7:08 AM MRN: [id number] Date of Birth: Sep 24, 1955 Attending MD: Hildred Laser , MD CSN: [id number] Age: 61 Admit Type: Outpatient Procedure:                Upper GI endoscopy Indications:              Epigastric abdominal pain, Gastro-esophageal reflux                            disease Providers:                Hildred Laser, MD, Otis Peak B. Sharon Seller, RN, Aram Candela Referring MD:             Glenda Chroman, MD Medicines:                Lidocaine spray, Meperidine 50 mg IV, Midazolam 8                            mg IV Complications:            No immediate complications. Estimated Blood Loss:     Estimated blood loss: none. Procedure:                Pre-Anesthesia Assessment:                           - Prior to the procedure, a History and Physical                            was performed, and patient medications and                            allergies were reviewed. The patient's tolerance of                            previous anesthesia was also reviewed. The risks                            and benefits of the procedure and the sedation                            options and risks were discussed with the patient.                            All questions were answered, and informed consent                            was obtained. Prior Anticoagulants: The patient                            last took aspirin 4 days prior to the procedure.  ASA Grade Assessment: III - A patient with severe                            systemic disease. After reviewing the risks and                            benefits, the patient was deemed in satisfactory                            condition to undergo the procedure.                           After obtaining informed consent, the endoscope was                            passed under direct vision. Throughout the              procedure, the patient's blood pressure, pulse, and                            oxygen saturations were monitored continuously. The                            [id number] ([id number]) scope was introduced through the                            mouth, and advanced to the second part of duodenum.                            The upper GI endoscopy was accomplished without                            difficulty. The patient tolerated the procedure                            well. Scope In: 7:48:15 AM Scope Out: 7:54:40 AM Total Procedure Duration: 0 hours 6 minutes 25 seconds  Findings:      The proximal esophagus and mid esophagus were normal.      A scar was found in the distal esophagus.      LA Grade A (one or more mucosal breaks less than 5 mm, not extending       between tops of 2 mucosal folds) esophagitis was found 35 to 36 cm from       the incisors.      A 6 cm hiatal hernia was present.      The entire examined stomach was normal.      The duodenal bulb and second portion of the duodenum were normal. Impression:               - Normal proximal esophagus and mid esophagus.                           - Scar in the distal esophagus.                           -  LA Grade A reflux esophagitis.                           - 6 cm hiatal hernia.                           - Normal stomach.                           - Normal duodenal bulb and second portion of the                            duodenum.                           - No specimens collected. Moderate Sedation:      Moderate (conscious) sedation was administered by the endoscopy nurse       and supervised by the endoscopist. The following parameters were       monitored: oxygen saturation, heart rate, blood pressure, CO2       capnography and response to care. Total physician intraservice time was       14 minutes. Recommendation:           - Patient has a contact number available for                            emergencies. The  signs and symptoms of potential                            delayed complications were discussed with the                            patient. Return to normal activities tomorrow.                            Written discharge instructions were provided to the                            patient.                           - Resume previous diet today.                           - Discontinue pantoprazole(it is not working) but                            continue present medications.                           - Resume aspirin at prior dose today.                           - Esomeprazole 40 mg by mouth twice a day( 0                            minutes  before breakfast and evening meal).                           - Will check LFTs today.                           - Flintstone chewable with iron 1 tablet twice                            daily. Procedure Code(s):        --- Professional ---                           [phone], Esophagogastroduodenoscopy, flexible,                            transoral; diagnostic, including collection of                            specimen(s) by brushing or washing, when performed                            (separate procedure)                           99152, Moderate sedation services provided by the                            same physician or other qualified health care                            professional performing the diagnostic or                            therapeutic service that the sedation supports,                            requiring the presence of an independent trained                            observer to assist in the monitoring of the                            patient's level of consciousness and physiological                            status; initial 15 minutes of intraservice time,                            patient age 69 years or older Diagnosis Code(s):        --- Professional ---                           K22.8, Other specified diseases  of esophagus  K21.0, Gastro-esophageal reflux disease with                            esophagitis                           K44.9, Diaphragmatic hernia without obstruction or                            gangrene                           R10.13, Epigastric pain CPT copyright 2016 [employer]. All rights reserved. The codes documented in this report are preliminary and upon coder review may  be revised to meet current compliance requirements. Hildred Laser, MD Hildred Laser, MD 12/03/2016 8:16:41 AM This report has been signed electronically. Number of Addenda: 0

## 2016-12-08 ENCOUNTER — Encounter (HOSPITAL_COMMUNITY): Payer: Self-pay | Admitting: Internal Medicine

## 2016-12-12 ENCOUNTER — Emergency Department (HOSPITAL_COMMUNITY): Payer: BLUE CROSS/BLUE SHIELD

## 2016-12-12 ENCOUNTER — Inpatient Hospital Stay (HOSPITAL_COMMUNITY)
Admission: EM | Admit: 2016-12-12 | Discharge: 2016-12-27 | DRG: 233 | Disposition: A | Discharge status: Home Health | Payer: BLUE CROSS/BLUE SHIELD | Attending: Cardiothoracic Surgery | Admitting: Cardiothoracic Surgery

## 2016-12-12 ENCOUNTER — Encounter (HOSPITAL_COMMUNITY): Payer: Self-pay | Admitting: Emergency Medicine

## 2016-12-12 DIAGNOSIS — J189 Pneumonia, unspecified organism: Secondary | ICD-10-CM | POA: Diagnosis present

## 2016-12-12 DIAGNOSIS — J440 Chronic obstructive pulmonary disease with acute lower respiratory infection: Secondary | ICD-10-CM | POA: Diagnosis present

## 2016-12-12 DIAGNOSIS — I480 Paroxysmal atrial fibrillation: Secondary | ICD-10-CM | POA: Diagnosis not present

## 2016-12-12 DIAGNOSIS — R079 Chest pain, unspecified: Secondary | ICD-10-CM | POA: Diagnosis not present

## 2016-12-12 DIAGNOSIS — I350 Nonrheumatic aortic (valve) stenosis: Secondary | ICD-10-CM | POA: Diagnosis present

## 2016-12-12 DIAGNOSIS — J9611 Chronic respiratory failure with hypoxia: Secondary | ICD-10-CM | POA: Diagnosis present

## 2016-12-12 DIAGNOSIS — I259 Chronic ischemic heart disease, unspecified: Secondary | ICD-10-CM | POA: Diagnosis not present

## 2016-12-12 DIAGNOSIS — J441 Chronic obstructive pulmonary disease with (acute) exacerbation: Secondary | ICD-10-CM | POA: Diagnosis present

## 2016-12-12 DIAGNOSIS — Z9981 Dependence on supplemental oxygen: Secondary | ICD-10-CM | POA: Diagnosis not present

## 2016-12-12 DIAGNOSIS — E785 Hyperlipidemia, unspecified: Secondary | ICD-10-CM | POA: Diagnosis present

## 2016-12-12 DIAGNOSIS — I5021 Acute systolic (congestive) heart failure: Secondary | ICD-10-CM | POA: Diagnosis not present

## 2016-12-12 DIAGNOSIS — R570 Cardiogenic shock: Secondary | ICD-10-CM | POA: Diagnosis present

## 2016-12-12 DIAGNOSIS — R0902 Hypoxemia: Secondary | ICD-10-CM | POA: Diagnosis present

## 2016-12-12 DIAGNOSIS — I5031 Acute diastolic (congestive) heart failure: Secondary | ICD-10-CM | POA: Diagnosis not present

## 2016-12-12 DIAGNOSIS — E871 Hypo-osmolality and hyponatremia: Secondary | ICD-10-CM | POA: Diagnosis not present

## 2016-12-12 DIAGNOSIS — Z6830 Body mass index (BMI) 30.0-30.9, adult: Secondary | ICD-10-CM

## 2016-12-12 DIAGNOSIS — I2510 Atherosclerotic heart disease of native coronary artery without angina pectoris: Secondary | ICD-10-CM | POA: Diagnosis present

## 2016-12-12 DIAGNOSIS — E876 Hypokalemia: Secondary | ICD-10-CM | POA: Diagnosis not present

## 2016-12-12 DIAGNOSIS — J90 Pleural effusion, not elsewhere classified: Secondary | ICD-10-CM | POA: Diagnosis not present

## 2016-12-12 DIAGNOSIS — K219 Gastro-esophageal reflux disease without esophagitis: Secondary | ICD-10-CM | POA: Diagnosis present

## 2016-12-12 DIAGNOSIS — J181 Lobar pneumonia, unspecified organism: Secondary | ICD-10-CM | POA: Diagnosis present

## 2016-12-12 DIAGNOSIS — Z87891 Personal history of nicotine dependence: Secondary | ICD-10-CM

## 2016-12-12 DIAGNOSIS — I255 Ischemic cardiomyopathy: Secondary | ICD-10-CM | POA: Diagnosis present

## 2016-12-12 DIAGNOSIS — I739 Peripheral vascular disease, unspecified: Secondary | ICD-10-CM | POA: Diagnosis present

## 2016-12-12 DIAGNOSIS — Z7951 Long term (current) use of inhaled steroids: Secondary | ICD-10-CM

## 2016-12-12 DIAGNOSIS — D62 Acute posthemorrhagic anemia: Secondary | ICD-10-CM | POA: Diagnosis not present

## 2016-12-12 DIAGNOSIS — I340 Nonrheumatic mitral (valve) insufficiency: Secondary | ICD-10-CM | POA: Diagnosis present

## 2016-12-12 DIAGNOSIS — I97190 Other postprocedural cardiac functional disturbances following cardiac surgery: Secondary | ICD-10-CM | POA: Diagnosis not present

## 2016-12-12 DIAGNOSIS — Y92230 Patient room in hospital as the place of occurrence of the external cause: Secondary | ICD-10-CM | POA: Diagnosis not present

## 2016-12-12 DIAGNOSIS — I952 Hypotension due to drugs: Secondary | ICD-10-CM | POA: Diagnosis not present

## 2016-12-12 DIAGNOSIS — I429 Cardiomyopathy, unspecified: Secondary | ICD-10-CM | POA: Diagnosis not present

## 2016-12-12 DIAGNOSIS — I200 Unstable angina: Secondary | ICD-10-CM | POA: Diagnosis present

## 2016-12-12 DIAGNOSIS — T501X5A Adverse effect of loop [high-ceiling] diuretics, initial encounter: Secondary | ICD-10-CM | POA: Diagnosis not present

## 2016-12-12 DIAGNOSIS — I214 Non-ST elevation (NSTEMI) myocardial infarction: Principal | ICD-10-CM | POA: Diagnosis present

## 2016-12-12 DIAGNOSIS — Z8249 Family history of ischemic heart disease and other diseases of the circulatory system: Secondary | ICD-10-CM | POA: Diagnosis not present

## 2016-12-12 DIAGNOSIS — R740 Nonspecific elevation of levels of transaminase and lactic acid dehydrogenase [LDH]: Secondary | ICD-10-CM | POA: Diagnosis present

## 2016-12-12 DIAGNOSIS — Z951 Presence of aortocoronary bypass graft: Secondary | ICD-10-CM

## 2016-12-12 DIAGNOSIS — E162 Hypoglycemia, unspecified: Secondary | ICD-10-CM | POA: Diagnosis not present

## 2016-12-12 DIAGNOSIS — Z79899 Other long term (current) drug therapy: Secondary | ICD-10-CM

## 2016-12-12 DIAGNOSIS — E669 Obesity, unspecified: Secondary | ICD-10-CM | POA: Diagnosis present

## 2016-12-12 DIAGNOSIS — Z7982 Long term (current) use of aspirin: Secondary | ICD-10-CM

## 2016-12-12 DIAGNOSIS — Z833 Family history of diabetes mellitus: Secondary | ICD-10-CM

## 2016-12-12 DIAGNOSIS — I5043 Acute on chronic combined systolic (congestive) and diastolic (congestive) heart failure: Secondary | ICD-10-CM | POA: Diagnosis not present

## 2016-12-12 LAB — COMPREHENSIVE METABOLIC PANEL
ALK PHOS: 101 U/L (ref 38–126)
ALT: 110 U/L — AB (ref 14–54)
AST: 53 U/L — ABNORMAL HIGH (ref 15–41)
Albumin: 4.1 g/dL (ref 3.5–5.0)
Anion gap: 14 (ref 5–15)
BILIRUBIN TOTAL: 1.1 mg/dL (ref 0.3–1.2)
BUN: 11 mg/dL (ref 6–20)
CALCIUM: 9.3 mg/dL (ref 8.9–10.3)
CO2: 24 mmol/L (ref 22–32)
CREATININE: 0.58 mg/dL (ref 0.44–1.00)
Chloride: 100 mmol/L — ABNORMAL LOW (ref 101–111)
Glucose, Bld: 115 mg/dL — ABNORMAL HIGH (ref 65–99)
Potassium: 3.7 mmol/L (ref 3.5–5.1)
Sodium: 138 mmol/L (ref 135–145)
Total Protein: 7.7 g/dL (ref 6.5–8.1)

## 2016-12-12 LAB — CBC WITH DIFFERENTIAL/PLATELET
Basophils Absolute: 0 10*3/uL (ref 0.0–0.1)
Basophils Relative: 0 %
EOS ABS: 0.1 10*3/uL (ref 0.0–0.7)
EOS PCT: 1 %
HCT: 42.1 % (ref 36.0–46.0)
Hemoglobin: 13.6 g/dL (ref 12.0–15.0)
Lymphocytes Relative: 17 %
Lymphs Abs: 1.7 10*3/uL (ref 0.7–4.0)
MCH: 29.1 pg (ref 26.0–34.0)
MCHC: 32.3 g/dL (ref 30.0–36.0)
MCV: 90.1 fL (ref 78.0–100.0)
MONOS PCT: 6 %
Monocytes Absolute: 0.6 10*3/uL (ref 0.1–1.0)
NEUTROS ABS: 7.2 10*3/uL (ref 1.7–7.7)
Neutrophils Relative %: 76 %
PLATELETS: 233 10*3/uL (ref 150–400)
RBC: 4.67 MIL/uL (ref 3.87–5.11)
RDW: 14.7 % (ref 11.5–15.5)
WBC: 9.5 10*3/uL (ref 4.0–10.5)

## 2016-12-12 LAB — I-STAT CHEM 8, ED
BUN: 10 mg/dL (ref 6–20)
CALCIUM ION: 1.05 mmol/L — AB (ref 1.15–1.40)
Chloride: 100 mmol/L — ABNORMAL LOW (ref 101–111)
Creatinine, Ser: 0.5 mg/dL (ref 0.44–1.00)
Glucose, Bld: 118 mg/dL — ABNORMAL HIGH (ref 65–99)
HCT: 44 % (ref 36.0–46.0)
Hemoglobin: 15 g/dL (ref 12.0–15.0)
Potassium: 3.8 mmol/L (ref 3.5–5.1)
SODIUM: 137 mmol/L (ref 135–145)
TCO2: 25 mmol/L (ref 22–32)

## 2016-12-12 LAB — I-STAT TROPONIN, ED: TROPONIN I, POC: 1.6 ng/mL — AB (ref 0.00–0.08)

## 2016-12-12 LAB — TROPONIN I
TROPONIN I: 2.15 ng/mL — AB (ref <0.03)
Troponin I: 2.2 ng/mL (ref <0.03)

## 2016-12-12 LAB — BRAIN NATRIURETIC PEPTIDE: B Natriuretic Peptide: 458 pg/mL — ABNORMAL HIGH (ref 0.0–100.0)

## 2016-12-12 LAB — I-STAT CG4 LACTIC ACID, ED
LACTIC ACID, VENOUS: 2.09 mmol/L — AB (ref 0.5–1.9)
LACTIC ACID, VENOUS: 1.63 mmol/L (ref 0.5–1.9)

## 2016-12-12 LAB — PROTIME-INR
INR: 1.01
PROTHROMBIN TIME: 13.2 s (ref 11.4–15.2)

## 2016-12-12 LAB — MAGNESIUM: MAGNESIUM: 1.9 mg/dL (ref 1.7–2.4)

## 2016-12-12 MED ORDER — ALBUTEROL SULFATE (2.5 MG/3ML) 0.083% IN NEBU: 5.0000 mg | INHALATION_SOLUTION | Freq: Once | RESPIRATORY_TRACT | Status: DC

## 2016-12-12 MED ORDER — IPRATROPIUM-ALBUTEROL 0.5-2.5 (3) MG/3ML IN SOLN
3.0000 mL | Freq: Once | RESPIRATORY_TRACT | Status: AC
  Administered 2016-12-12: 3 mL via RESPIRATORY_TRACT

## 2016-12-12 MED ORDER — IPRATROPIUM-ALBUTEROL 0.5-2.5 (3) MG/3ML IN SOLN
RESPIRATORY_TRACT | Status: AC
  Administered 2016-12-12: 3 mL via RESPIRATORY_TRACT
  Filled 2016-12-12: qty 3

## 2016-12-12 MED ORDER — FUROSEMIDE 10 MG/ML IJ SOLN
40.0000 mg | Freq: Once | INTRAMUSCULAR | Status: AC
  Administered 2016-12-12: 40 mg via INTRAVENOUS
  Filled 2016-12-12: qty 4

## 2016-12-12 MED ORDER — HEPARIN BOLUS VIA INFUSION
4000.0000 [IU] | Freq: Once | INTRAVENOUS | Status: AC
  Administered 2016-12-12: 4000 [IU] via INTRAVENOUS

## 2016-12-12 MED ORDER — FENTANYL CITRATE (PF) 100 MCG/2ML IJ SOLN
50.0000 ug | Freq: Once | INTRAMUSCULAR | Status: AC
  Administered 2016-12-12: 50 ug via INTRAVENOUS
  Filled 2016-12-12: qty 2

## 2016-12-12 MED ORDER — DEXTROSE 5 % IV SOLN
1.0000 g | Freq: Once | INTRAVENOUS | Status: AC
  Administered 2016-12-12: 1 g via INTRAVENOUS
  Filled 2016-12-12: qty 10

## 2016-12-12 MED ORDER — HEPARIN (PORCINE) IN NACL 100-0.45 UNIT/ML-% IJ SOLN
1000.0000 [IU]/h | INTRAMUSCULAR | Status: DC
  Administered 2016-12-12: 1200 [IU]/h via INTRAVENOUS
  Administered 2016-12-13: 1000 [IU]/h via INTRAVENOUS
  Filled 2016-12-12 (×2): qty 250

## 2016-12-12 MED ORDER — POTASSIUM CHLORIDE CRYS ER 20 MEQ PO TBCR
10.0000 meq | EXTENDED_RELEASE_TABLET | Freq: Once | ORAL | Status: AC
  Administered 2016-12-13: 10 meq via ORAL
  Filled 2016-12-12: qty 1

## 2016-12-12 MED ORDER — ONDANSETRON HCL 4 MG PO TABS: 4.0000 mg | ORAL_TABLET | Freq: Four times a day (QID) | ORAL | Status: DC | PRN

## 2016-12-12 MED ORDER — ASPIRIN 81 MG PO CHEW
324.0000 mg | CHEWABLE_TABLET | Freq: Once | ORAL | Status: AC
  Administered 2016-12-12: 324 mg via ORAL
  Filled 2016-12-12: qty 4

## 2016-12-12 MED ORDER — DEXTROSE 5 % IV SOLN
500.0000 mg | Freq: Once | INTRAVENOUS | Status: AC
  Administered 2016-12-12: 500 mg via INTRAVENOUS
  Filled 2016-12-12: qty 500

## 2016-12-12 MED ORDER — IOPAMIDOL (ISOVUE-370) INJECTION 76%
100.0000 mL | Freq: Once | INTRAVENOUS | Status: AC | PRN
  Administered 2016-12-12: 100 mL via INTRAVENOUS

## 2016-12-12 MED ORDER — SODIUM CHLORIDE 0.9 % IV BOLUS (SEPSIS)
1000.0000 mL | Freq: Once | INTRAVENOUS | Status: AC
  Administered 2016-12-12: 1000 mL via INTRAVENOUS

## 2016-12-12 MED ORDER — ALBUTEROL SULFATE (2.5 MG/3ML) 0.083% IN NEBU
2.5000 mg | INHALATION_SOLUTION | Freq: Once | RESPIRATORY_TRACT | Status: AC
  Administered 2016-12-12: 2.5 mg via RESPIRATORY_TRACT

## 2016-12-12 MED ORDER — MAGNESIUM SULFATE 2 GM/50ML IV SOLN
2.0000 g | Freq: Once | INTRAVENOUS | Status: AC
Start: 2016-12-13 — End: 2016-12-13
  Administered 2016-12-13: 2 g via INTRAVENOUS
  Filled 2016-12-12: qty 50

## 2016-12-12 MED ORDER — LEVALBUTEROL HCL 1.25 MG/0.5ML IN NEBU: 1.2500 mg | INHALATION_SOLUTION | Freq: Four times a day (QID) | RESPIRATORY_TRACT | Status: DC

## 2016-12-12 MED ORDER — ALBUTEROL SULFATE (2.5 MG/3ML) 0.083% IN NEBU
INHALATION_SOLUTION | RESPIRATORY_TRACT | Status: AC
  Administered 2016-12-12: 2.5 mg via RESPIRATORY_TRACT
  Filled 2016-12-12: qty 3

## 2016-12-12 MED ORDER — IPRATROPIUM BROMIDE 0.02 % IN SOLN
0.5000 mg | Freq: Four times a day (QID) | RESPIRATORY_TRACT | Status: DC
  Administered 2016-12-12 – 2016-12-13 (×4): 0.5 mg via RESPIRATORY_TRACT
  Filled 2016-12-12 (×4): qty 2.5

## 2016-12-12 MED ORDER — ONDANSETRON HCL 4 MG/2ML IJ SOLN
4.0000 mg | Freq: Four times a day (QID) | INTRAMUSCULAR | Status: DC | PRN
  Administered 2016-12-13: 4 mg via INTRAVENOUS
  Filled 2016-12-12: qty 2

## 2016-12-12 MED ORDER — IPRATROPIUM BROMIDE 0.02 % IN SOLN: 0.5000 mg | Freq: Once | RESPIRATORY_TRACT | Status: DC

## 2016-12-12 MED ORDER — LEVALBUTEROL HCL 1.25 MG/0.5ML IN NEBU
1.2500 mg | INHALATION_SOLUTION | Freq: Four times a day (QID) | RESPIRATORY_TRACT | Status: DC
  Administered 2016-12-12 – 2016-12-13 (×4): 1.25 mg via RESPIRATORY_TRACT
  Filled 2016-12-12 (×4): qty 0.5

## 2016-12-12 NOTE — ED Provider Notes (Addendum)
[facility] EMERGENCY DEPARTMENT Provider Note   CSN: [id number] Arrival date & time: 12/12/16  1747     History   Chief Complaint Chief Complaint  Patient presents with  . Shortness of Breath    HPI Emily Rocha is a 61 y.o. female.  HPI  61 year old female with a history of COPD presents with shortness of breath.  She has been feeling short of breath for about 1 week.  At first it was only exertional but over the last 2 days it is now all the time and worsening.  She has tried her albuterol inhaler with no relief.  No fevers.  She has been having a nonproductive cough and has had some chest heaviness as well.  No leg swelling.  Over the last 3 weeks she has had 2 rounds of antibiotics and steroids for bronchitis by her PCP.  Oxygen saturation 84% in triage.  She feels better now that she has nasal cannula oxygen on and is speaking clear.  Past Medical History:  Diagnosis Date  . Asthma   . COPD (chronic obstructive pulmonary disease) ([name])   . GERD (gastroesophageal reflux disease)   . Shortness of breath dyspnea     Patient Active Problem List   Diagnosis Date Noted  . Chest pain 12/12/2016  . Abdominal pain, epigastric 11/30/2016  . Absolute anemia 11/30/2016  . Carotid stenosis 12/19/2014  . Aftercare following surgery of the circulatory system 12/19/2014  . Carotid artery stenosis, symptomatic 12/06/2014  . Symptomatic carotid artery stenosis 12/04/2014    Past Surgical History:  Procedure Laterality Date  . CHOLECYSTECTOMY    . COLONOSCOPY N/A 10/03/2014   Procedure: COLONOSCOPY;  Surgeon: Rogene Houston, MD;  Location: [facility];  Service: Endoscopy;  Laterality: N/A;  730  . ENDARTERECTOMY Right 12/06/2014   Procedure: ENDARTERECTOMY CAROTid;  Surgeon: Elam Dutch, MD;  Location: [facility] OR;  Service: Vascular;  Laterality: Right;  . ESOPHAGOGASTRODUODENOSCOPY N/A 12/03/2016   Procedure: ESOPHAGOGASTRODUODENOSCOPY (EGD);  Surgeon: Rogene Houston, MD;   Location: [facility];  Service: Endoscopy;  Laterality: N/A;  7:30  . PERIPHERAL VASCULAR CATHETERIZATION N/A 12/05/2014   Procedure:  Carotid Arch Angiography;  Surgeon: Conrad Boone, MD;  Location: [facility];  Service: Cardiovascular;  Laterality: N/A;    OB History    No data available       Home Medications    Prior to Admission medications   Medication Sig Start Date End Date Taking? Authorizing Provider  ALPRAZolam Duanne Moron) 0.5 MG tablet Take 0.5 mg by mouth 2 (two) times daily as needed for anxiety.    Yes [provider]  aspirin EC 81 MG tablet Take 81 mg by mouth at bedtime.    Yes [provider]  atorvastatin (LIPITOR) 10 MG tablet Take 1 tablet (10 mg total) by mouth daily. 12/07/14  Yes Alvia Grove, PA-C  Cholecalciferol (VITAMIN D) 2000 units CAPS Take 2,000 Units by mouth daily.   Yes [provider]  esomeprazole (NEXIUM) 40 MG capsule Take 1 capsule (40 mg total) by mouth 2 (two) times daily before a meal. 12/03/16  Yes Rehman, Mechele Dawley, MD  Multiple Vitamin (MULTIVITAMIN WITH MINERALS) TABS tablet Take 1 tablet by mouth daily.   Yes [provider]  SYMBICORT 160-4.5 MCG/ACT inhaler Inhale 2 puffs into the lungs 2 (two) times daily.  07/29/14  Yes [provider]  VENTOLIN HFA 108 (90 BASE) MCG/ACT inhaler Inhale 2 puffs into  the lungs every 6 (six) hours as needed for shortness of breath.  08/26/14  Yes [provider]  Pediatric Multivitamins-Iron (FLINTSTONES PLUS IRON) chewable tablet Chew 1 tablet by mouth 2 (two) times daily. Patient not taking: Reported on 12/12/2016 12/03/16   Rogene Houston, MD    Family History Family History  Problem Relation Age of Onset  . Heart attack Mother   . Cancer Mother   . Diabetes Mother   . Heart disease Mother   . Heart attack Father   . Heart disease Father     Social History Social History  Substance Use Topics  . Smoking status: Former Smoker      Packs/day: 2.00    Years: 40.00    Quit date: 10/03/2010  . Smokeless tobacco: Never Used     Comment: No longer using the Vapor  . Alcohol use No     Allergies   Patient has no known allergies.   Review of Systems Review of Systems  Constitutional: Negative for fever.  HENT: Negative for sore throat.   Respiratory: Positive for cough and shortness of breath.   Cardiovascular: Positive for chest pain. Negative for leg swelling.  Gastrointestinal: Negative for abdominal pain and vomiting.  All other systems reviewed and are negative.    Physical Exam Updated Vital Signs BP 119/86   Pulse (!) 122   Temp 98.1 F (36.7 C) (Oral)   Resp (!) 26   Ht 5\' 4"  (1.626 m)   Wt 81.6 kg (180 lb)   SpO2 91%   BMI 30.90 kg/m   Physical Exam  Constitutional: She is oriented to person, place, and time. She appears well-developed and well-nourished. She appears distressed.  HENT:  Head: Normocephalic and atraumatic.  Right Ear: External ear normal.  Left Ear: External ear normal.  Nose: Nose normal.  Eyes: Right eye exhibits no discharge. Left eye exhibits no discharge.  Cardiovascular: Regular rhythm and normal heart sounds.  Tachycardia present.   Pulmonary/Chest: No accessory muscle usage. Tachypnea noted. She has decreased breath sounds. She has wheezes. She has rales in the right lower field and the left lower field.  Abdominal: Soft. There is no tenderness.  Musculoskeletal: She exhibits no edema.  Neurological: She is alert and oriented to person, place, and time.  Skin: Skin is warm and dry. She is not diaphoretic.  Nursing note and vitals reviewed.    ED Treatments / Results  Labs (all labs ordered are listed, but only abnormal results are displayed) Labs Reviewed  TROPONIN I - Abnormal; Notable for the following:       Result Value   Troponin I 2.20 (*)    All other components within normal limits  BRAIN NATRIURETIC PEPTIDE - Abnormal; Notable for the  following:    B Natriuretic Peptide 458.0 (*)    All other components within normal limits  COMPREHENSIVE METABOLIC PANEL - Abnormal; Notable for the following:    Chloride 100 (*)    Glucose, Bld 115 (*)    AST 53 (*)    ALT 110 (*)    All other components within normal limits  I-STAT CG4 LACTIC ACID, ED - Abnormal; Notable for the following:    Lactic Acid, Venous 2.09 (*)    All other components within normal limits  I-STAT TROPONIN, ED - Abnormal; Notable for the following:    Troponin i, poc 1.60 (*)    All other components within normal limits  I-STAT CHEM 8, ED - Abnormal;  Notable for the following:    Chloride 100 (*)    Glucose, Bld 118 (*)    Calcium, Ion 1.05 (*)    All other components within normal limits  CULTURE, BLOOD (ROUTINE X 2)  CULTURE, BLOOD (ROUTINE X 2)  CBC WITH DIFFERENTIAL/PLATELET  PROTIME-INR  CBC  HEPARIN LEVEL (UNFRACTIONATED)  I-STAT CG4 LACTIC ACID, ED    EKG  EKG Interpretation  Date/Time:  Sunday December 12 2016 19:17:07 EDT Ventricular Rate:  124 PR Interval:    QRS Duration: 106 QT Interval:  326 QTC Calculation: 469 R Axis:   0 Text Interpretation:  Sinus tachycardia Anteroseptal infarct, old Repol abnrm suggests ischemia, anterolateral Confirmed by Sherwood Gambler [phone]) on 12/12/2016 9:32:16 PM       Radiology Ct Angio Chest Pe W And/or Wo Contrast  Result Date: 12/12/2016 CLINICAL DATA:  Increase shortness of breath and chest pain EXAM: CT ANGIOGRAPHY CHEST WITH CONTRAST TECHNIQUE: Multidetector CT imaging of the chest was performed using the standard protocol during bolus administration of intravenous contrast. Multiplanar CT image reconstructions and MIPs were obtained to evaluate the vascular anatomy. CONTRAST:  100 mL Isovue 370 intravenous COMPARISON:  Radiograph 12/12/2016 FINDINGS: Cardiovascular: Respiratory motion artifact limits evaluation for pulmonary emboli particularly in the lower lobes. Allowing for this, no  definite acute filling defects are seen within the central or segmental pulmonary vessels to suggest acute embolus. Aortic atherosclerosis. No aneurysmal dilatation. Coronary artery calcification. Borderline to mild cardiomegaly. No pericardial effusion. Reflux of contrast into the hepatic veins consistent with elevated right heart pressure Mediastinum/Nodes: Midline trachea. No thyroid mass. Nonspecific subcentimeter mediastinal lymph nodes. Moderate distal hiatal hernia Lungs/Pleura: Moderate emphysema. Small right greater than left pleural effusions. Ground-glass densities within the right middle lobe, and right greater than left lower lobes. Partial consolidations within both lower lobes. Upper Abdomen: Surgical clips in the right upper quadrant. Fat containing ventral hernia. Musculoskeletal: Degenerative changes. No acute or suspicious bone lesion Review of the MIP images confirms the above findings. IMPRESSION: 1. Respiratory motion artifact limits assessment for pulmonary emboli in the lower lobes. Allowing for motion, no acute pulmonary embolus is seen 2. Mild cardiomegaly. Small right greater than left pleural effusions with multifocal right greater than left ground-glass opacity which may reflect atypical distribution of edema or multifocal pneumonia. 3. Moderate emphysema Aortic Atherosclerosis (ICD10-I70.0) and Emphysema (ICD10-J43.9). Electronically Signed   By: Donavan Foil M.D.   On: 12/12/2016 21:07   Dg Chest Port 1 View  Result Date: 12/12/2016 CLINICAL DATA:  61 year old female with dyspnea. EXAM: PORTABLE CHEST 1 VIEW COMPARISON:  Chest radiograph dated 11/05/2016 FINDINGS: There is emphysematous changes of the lungs. There is diffuse interstitial prominence, progressed from prior radiograph and likely represent a degree of edema on a background of emphysema. Superimposed pneumonia is not excluded. Clinical correlation is recommended. There is no pleural effusion or pneumothorax. There is  stable mild cardiomegaly. No acute osseous pathology. IMPRESSION: 1. Mild cardiomegaly with findings of mild CHF. Superimposed pneumonia is not excluded. Clinical correlation is recommended. 2. Emphysema. Electronically Signed   By: Anner Crete M.D.   On: 12/12/2016 18:49    Procedures .Critical Care Performed by: Sherwood Gambler Authorized by: Sherwood Gambler   Critical care provider statement:    Critical care time (minutes):  40   Critical care was necessary to treat or prevent imminent or life-threatening deterioration of the following conditions:  Cardiac failure and respiratory failure   Critical care was time spent personally by me on  the following activities:  Discussions with consultants, examination of patient, evaluation of patient's response to treatment, obtaining history from patient or surrogate, ordering and performing treatments and interventions, ordering and review of laboratory studies, ordering and review of radiographic studies, pulse oximetry and re-evaluation of patient's condition    (including critical care time)  Medications Ordered in ED Medications  cefTRIAXone (ROCEPHIN) 1 g in dextrose 5 % 50 mL IVPB (1 g Intravenous New Bag/Given 12/12/16 2155)  azithromycin (ZITHROMAX) 500 mg in dextrose 5 % 250 mL IVPB (not administered)  furosemide (LASIX) injection 40 mg (not administered)  heparin bolus via infusion 4,000 Units (not administered)    Followed by  heparin ADULT infusion 100 units/mL (25000 units/263mL sodium chloride 0.45%) (not administered)  sodium chloride 0.9 % bolus 1,000 mL (0 mLs Intravenous Stopped 12/12/16 1902)  ipratropium-albuterol (DUONEB) 0.5-2.5 (3) MG/3ML nebulizer solution 3 mL (3 mLs Nebulization Given 12/12/16 1813)  albuterol (PROVENTIL) (2.5 MG/3ML) 0.083% nebulizer solution 2.5 mg (2.5 mg Nebulization Given 12/12/16 1813)  fentaNYL (SUBLIMAZE) injection 50 mcg (50 mcg Intravenous Given 12/12/16 1848)  aspirin chewable tablet  324 mg (324 mg Oral Given 12/12/16 1901)  iopamidol (ISOVUE-370) 76 % injection 100 mL (100 mLs Intravenous Contrast Given 12/12/16 2023)  fentaNYL (SUBLIMAZE) injection 50 mcg (50 mcg Intravenous Given 12/12/16 2118)     Initial Impression / Assessment and Plan / ED Course  I have reviewed the triage vital signs and the nursing notes.  Pertinent labs & imaging results that were available during my care of the patient were reviewed by me and considered in my medical decision making (see chart for details).     Patient is complicated.  Her presentation is most consistent with pneumonia with about 1 week of cough that is worsening.  With her pleuritic back pain, this would also make sense.  She was initially started on some IV fluids but after her chest x-ray, it appears that she might have pulmonary edema.  Thus fluids were stopped after about 300 cc or less.  She does feel better after 1 breathing treatment.  She was also given IV fentanyl for pain.  She no longer has any chest pain or back pain.  Her troponin has come back at around 2.  She was given aspirin and started on heparin and a CT was obtained to help rule out PE.  This does not show obvious PE but does show possible pneumonia as well as pleural effusions.  She was given a dose of IV Lasix. ECG shows tachycardia and diffuse ST depressions. Has some odd morphology to AVL but does not meet STEMI criteria and is not changing on repeat ECG. I discussed her case with cardiology on-call, Dr. Harrell Gave, who reviewed case and ECGs and recommends keeping on heparin and aspirin and cycling troponins.  Unclear if this is a cardiac event or secondary MI from another cause such as pneumonia.  Discussed with Dr. Olevia Bowens, who will admit to the hospitalist service and transferred to [facility] as she will likely need a heart cath as well.  Currently patient is feeling better.  While she is still moderately tachycardic in the 110s, she overall appears better  and is breathing easier.  Adequate oxygen saturation on nasal cannula oxygen.  Final Clinical Impressions(s) / ED Diagnoses   Final diagnoses:  NSTEMI (non-ST elevated myocardial infarction) ([place])  Community acquired pneumonia of left lower lobe of lung ([place])  Hypoxia  Bilateral pleural effusion    New Prescriptions New  Prescriptions   No medications on file     Sherwood Gambler, MD 12/12/16 2213    Sherwood Gambler, MD 12/12/16 2215

## 2016-12-12 NOTE — ED Notes (Signed)
Pt weight 81.6 kg per triage note of weight

## 2016-12-12 NOTE — Progress Notes (Signed)
ANTICOAGULATION CONSULT NOTE - Initial Consult  Pharmacy Consult for Heparin Indication: pulmonary embolus  No Known Allergies  Patient Measurements: Height: 5\' 4"  (162.6 cm) Weight: 180 lb (81.6 kg) IBW/kg (Calculated) : 54.7 HEPARIN DW (KG): 72.4  Vital Signs: Temp: 98.1 F (36.7 C) (10/28 1754) Temp Source: Oral (10/28 1754) BP: 115/82 (10/28 1850) Pulse Rate: 121 (10/28 1850)  Labs:  Recent Labs  12/12/16 1803 12/12/16 1856 12/12/16 1857 12/12/16 2011  HGB 13.6  --   --  15.0  HCT 42.1  --   --  44.0  PLT 233  --   --   --   CREATININE  --   --  0.58 0.50  TROPONINI  --  2.20*  --   --     Estimated Creatinine Clearance: 74.4 mL/min (by C-G formula based on SCr of 0.5 mg/dL).   Medical History: Past Medical History:  Diagnosis Date  . Asthma   . COPD (chronic obstructive pulmonary disease) ([place])   . GERD (gastroesophageal reflux disease)   . Shortness of breath dyspnea     Medications:  See med rec  Assessment: 61 yo female presented to the ED with SOB and chest pain. She has been feeling short of breath for about 1 week. Troponins are elevated. Pharmacy consulted for heparin dosing.   Goal of Therapy:  Heparin level 0.3-0.7 units/ml Monitor platelets by anticoagulation protocol: Yes   Plan:  Give 4000 units bolus x 1 Start heparin infusion at 1200 units/hr Check anti-Xa level in 6-8 hours and daily while on heparin Continue to monitor H&H and platelets  Isac Sarna, BS Vena Austria, BCPS Clinical Pharmacist Pager [phone] 12/12/2016,8:22 PM

## 2016-12-12 NOTE — ED Notes (Signed)
Date and time results received: 12/12/16 8:07 PM   Test: Trop  Critical Value: 2.20  Name of Provider Notified: Dr. Regenia Skeeter  Orders Received? Or Actions Taken?: Notified.

## 2016-12-12 NOTE — H&P (Signed)
History and Physical    Emily Rocha ESP:[id number] DOB: 10/11/1955 DOA: 12/12/2016  PCP: Glenda Chroman, MD   Patient coming from: Home.  I have personally briefly reviewed patient's old medical records in [place]  Chief Complaint: Chest pain and shortness of breath.  HPI: Emily Rocha is a 61 y.o. female with medical history significant of asthma, COPD, GERD who comes to the emergency department with complaints of on and off chest pain for the past 3 months as associated with dyspnea which has gotten progressively worse in the past few days. The pain is precordial, pressure-like, nonradiating associated with worsening of chronic dyspnea and diaphoresis. She denies lower extremity edema, but complains of PND, orthopnea and postural dizziness. She denies fever, chills, sore throat, but complains of cough occasionally productive, associated with wheezing and rhonchi not significantly relieved by the use of her rescue inhalers. She denies abdominal pain, diarrhea, constipation, melena or hematochezia. She denies dysuria, hematuria or oliguria.  ED Course: Initial vital signs temperature 98.94F, pulse 129, blood pressure 135/72 mmHg, respirations 26 and O2 sat 84% on room air.   Workup: WBC of 9.5 with normal differential, hemoglobin 13.6 g/dL and platelets 233. PT/INR were normal. Blood cultures 2 were taken. CMP showed mild transaminitis, a glucose of 115 mg/dL and chloride of 100 mmol/L. BNP was 458 pg/mL and troponin 2.20 mg/mL.  Imaging: A chest radiograph showed mild cardiomegaly with findings of mild CHF. Emphysema. Superimposed pneumonia was not excluded. CT angiogram of the chest did not show any acute pulmonary embolism. Moderate emphysema. Cardiomegaly with small right greater than left pleural effusions with multifocal right greater than left groundglass opacity which may reflect atypical distribution of edema or multifocal pneumonia. Please see images and full radiology report  for further details.  Treatment in the ED: She received supplemental oxygen, aspirin 324 mg by mouth 1, fentanyl 50 g IVP 2 doses, furosemide 40 mg IVP 1 dose, potassium supplementation, 1 DuoNeb treatment, later Xopenex and ipratropium neb, magnesium sulfate 2 g IVPB, ceftriaxone 1 g and azithromycin 500 mg IVPB. She was started on a heparin infusion after the case was discussed with cardiology.  Review of Systems: As per HPI otherwise 10 point review of systems negative.    Past Medical History:  Diagnosis Date  . Asthma   . COPD (chronic obstructive pulmonary disease) ([place])   . GERD (gastroesophageal reflux disease)   . Shortness of breath dyspnea     Past Surgical History:  Procedure Laterality Date  . CHOLECYSTECTOMY    . COLONOSCOPY N/A 10/03/2014   Procedure: COLONOSCOPY;  Surgeon: Rogene Houston, MD;  Location[facility];  Service: Endoscopy;  Laterality: N/A;  730  . ENDARTERECTOMY Right 12/06/2014   Procedure: ENDARTERECTOMY CAROTid;  Surgeon: Elam Dutch, MD;  Location: [facility] OR;  Service: Vascular;  Laterality: Right;  . ESOPHAGOGASTRODUODENOSCOPY N/A 12/03/2016   Procedure: ESOPHAGOGASTRODUODENOSCOPY (EGD);  Surgeon: Rogene Houston, MD;  Location: [facility];  Service: Endoscopy;  Laterality: N/A;  7:30  . PERIPHERAL VASCULAR CATHETERIZATION N/A 12/05/2014   Procedure:  Carotid Arch Angiography;  Surgeon: Conrad Republic, MD;  Location: [facility];  Service: Cardiovascular;  Laterality: N/A;     reports that she quit smoking about 6 years ago. She has a 80.00 pack-year smoking history. She has never used smokeless tobacco. She reports that she does not drink alcohol or use drugs.  No Known Allergies  Family History  Problem Relation Age  of Onset  . Heart attack Mother   . Cancer Mother   . Diabetes Mother   . Heart disease Mother   . Heart attack Father   . Heart disease Father     Prior to Admission medications   Medication Sig Start Date  End Date Taking? Authorizing Provider  ALPRAZolam Duanne Moron) 0.5 MG tablet Take 0.5 mg by mouth 2 (two) times daily as needed for anxiety.    Yes [provider]  aspirin EC 81 MG tablet Take 81 mg by mouth at bedtime.    Yes [provider]  atorvastatin (LIPITOR) 10 MG tablet Take 1 tablet (10 mg total) by mouth daily. 12/07/14  Yes Alvia Grove, PA-C  Cholecalciferol (VITAMIN D) 2000 units CAPS Take 2,000 Units by mouth daily.   Yes [provider]  esomeprazole (NEXIUM) 40 MG capsule Take 1 capsule (40 mg total) by mouth 2 (two) times daily before a meal. 12/03/16  Yes Rehman, Mechele Dawley, MD  Multiple Vitamin (MULTIVITAMIN WITH MINERALS) TABS tablet Take 1 tablet by mouth daily.   Yes [provider]  SYMBICORT 160-4.5 MCG/ACT inhaler Inhale 2 puffs into the lungs 2 (two) times daily.  07/29/14  Yes [provider]  VENTOLIN HFA 108 (90 BASE) MCG/ACT inhaler Inhale 2 puffs into the lungs every 6 (six) hours as needed for shortness of breath.  08/26/14  Yes [provider]  Pediatric Multivitamins-Iron (FLINTSTONES PLUS IRON) chewable tablet Chew 1 tablet by mouth 2 (two) times daily. Patient not taking: Reported on 12/12/2016 12/03/16   Rogene Houston, MD    Physical Exam: Vitals:   12/12/16 2300 12/12/16 2331 12/12/16 2340 12/12/16 2341  BP: 98/85 97/80    Pulse: (!) 117 (!) 119  (!) 115  Resp: (!) 34 (!) 25  (!) 23  Temp:      TempSrc:      SpO2: 94% 90% 94% 94%  Weight:      Height:        Constitutional: NAD, calm, comfortable Eyes: PERRL, lids and conjunctivae normal ENMT: Mucous membranes are moist. Posterior pharynx clear of any exudate or lesions.Normal dentition.  Neck: normal, supple, no masses, no thyromegaly Respiratory: Tachypneic at 26 breaths per minute, decreased breath sounds bilaterally, particularly at the bases with bilateral wheezing and mild rhonchi, bibasilar rales. No accessory muscle use. Cardiovascular:  Tachycardic at 112 bpm, no murmurs / rubs / gallops. No extremity edema. 2+ pedal pulses. No carotid bruits.  Abdomen: Soft, no tenderness, no masses palpated. No hepatosplenomegaly. Bowel sounds positive.  Musculoskeletal: no clubbing / cyanosis.  Good ROM, no contractures. Normal muscle tone.  Skin: no rashes, lesions, ulcers on limited skin exam. Neurologic: CN 2-12 grossly intact. Sensation intact, DTR normal. Strength 5/5 in all 4.  Psychiatric: Normal judgment and insight. Alert and oriented x 4. Normal mood.    Labs on Admission: I have personally reviewed following labs and imaging studies  CBC:  Recent Labs Lab 12/12/16 1803 12/12/16 2011  WBC 9.5  --   NEUTROABS 7.2  --   HGB 13.6 15.0  HCT 42.1 44.0  MCV 90.1  --   PLT 233  --    Basic Metabolic Panel:  Recent Labs Lab 12/12/16 1857 12/12/16 2011 12/12/16 2301  NA 138 137  --   K 3.7 3.8  --   CL 100* 100*  --   CO2 24  --   --   GLUCOSE 115* 118*  --  BUN 11 10  --   CREATININE 0.58 0.50  --   CALCIUM 9.3  --   --   MG  --   --  1.9   GFR: Estimated Creatinine Clearance: 74.4 mL/min (by C-G formula based on SCr of 0.5 mg/dL). Liver Function Tests:  Recent Labs Lab 12/12/16 1857  AST 53*  ALT 110*  ALKPHOS 101  BILITOT 1.1  PROT 7.7  ALBUMIN 4.1   No results for input(s): LIPASE, AMYLASE in the last 168 hours. No results for input(s): AMMONIA in the last 168 hours. Coagulation Profile:  Recent Labs Lab 12/12/16 1854  INR 1.01   Cardiac Enzymes:  Recent Labs Lab 12/12/16 1856 12/12/16 2301  TROPONINI 2.20* 2.15*   BNP (last 3 results) No results for input(s): PROBNP in the last 8760 hours. HbA1C: No results for input(s): HGBA1C in the last 72 hours. CBG: No results for input(s): GLUCAP in the last 168 hours. Lipid Profile: No results for input(s): CHOL, HDL, LDLCALC, TRIG, CHOLHDL, LDLDIRECT in the last 72 hours. Thyroid Function Tests: No results for input(s): TSH, T4TOTAL,  FREET4, T3FREE, THYROIDAB in the last 72 hours. Anemia Panel: No results for input(s): VITAMINB12, FOLATE, FERRITIN, TIBC, IRON, RETICCTPCT in the last 72 hours. Urine analysis:    Component Value Date/Time   COLORURINE YELLOW 12/04/2014 1737   APPEARANCEUR HAZY (A) 12/04/2014 1737   LABSPEC 1.022 12/04/2014 1737   PHURINE 5.0 12/04/2014 1737   GLUCOSEU NEGATIVE 12/04/2014 1737   HGBUR NEGATIVE 12/04/2014 1737   BILIRUBINUR NEGATIVE 12/04/2014 1737   KETONESUR NEGATIVE 12/04/2014 1737   PROTEINUR NEGATIVE 12/04/2014 1737   UROBILINOGEN 0.2 12/04/2014 1737   NITRITE NEGATIVE 12/04/2014 1737   LEUKOCYTESUR NEGATIVE 12/04/2014 1737    Radiological Exams on Admission: Ct Angio Chest Pe W And/or Wo Contrast  Result Date: 12/12/2016 CLINICAL DATA:  Increase shortness of breath and chest pain EXAM: CT ANGIOGRAPHY CHEST WITH CONTRAST TECHNIQUE: Multidetector CT imaging of the chest was performed using the standard protocol during bolus administration of intravenous contrast. Multiplanar CT image reconstructions and MIPs were obtained to evaluate the vascular anatomy. CONTRAST:  100 mL Isovue 370 intravenous COMPARISON:  Radiograph 12/12/2016 FINDINGS: Cardiovascular: Respiratory motion artifact limits evaluation for pulmonary emboli particularly in the lower lobes. Allowing for this, no definite acute filling defects are seen within the central or segmental pulmonary vessels to suggest acute embolus. Aortic atherosclerosis. No aneurysmal dilatation. Coronary artery calcification. Borderline to mild cardiomegaly. No pericardial effusion. Reflux of contrast into the hepatic veins consistent with elevated right heart pressure Mediastinum/Nodes: Midline trachea. No thyroid mass. Nonspecific subcentimeter mediastinal lymph nodes. Moderate distal hiatal hernia Lungs/Pleura: Moderate emphysema. Small right greater than left pleural effusions. Ground-glass densities within the right middle lobe, and right  greater than left lower lobes. Partial consolidations within both lower lobes. Upper Abdomen: Surgical clips in the right upper quadrant. Fat containing ventral hernia. Musculoskeletal: Degenerative changes. No acute or suspicious bone lesion Review of the MIP images confirms the above findings. IMPRESSION: 1. Respiratory motion artifact limits assessment for pulmonary emboli in the lower lobes. Allowing for motion, no acute pulmonary embolus is seen 2. Mild cardiomegaly. Small right greater than left pleural effusions with multifocal right greater than left ground-glass opacity which may reflect atypical distribution of edema or multifocal pneumonia. 3. Moderate emphysema Aortic Atherosclerosis (ICD10-I70.0) and Emphysema (ICD10-J43.9). Electronically Signed   By: Donavan Foil M.D.   On: 12/12/2016 21:07   Dg Chest Port 1 View  Result Date: 12/12/2016  CLINICAL DATA:  61 year old female with dyspnea. EXAM: PORTABLE CHEST 1 VIEW COMPARISON:  Chest radiograph dated 11/05/2016 FINDINGS: There is emphysematous changes of the lungs. There is diffuse interstitial prominence, progressed from prior radiograph and likely represent a degree of edema on a background of emphysema. Superimposed pneumonia is not excluded. Clinical correlation is recommended. There is no pleural effusion or pneumothorax. There is stable mild cardiomegaly. No acute osseous pathology. IMPRESSION: 1. Mild cardiomegaly with findings of mild CHF. Superimposed pneumonia is not excluded. Clinical correlation is recommended. 2. Emphysema. Electronically Signed   By: Anner Crete M.D.   On: 12/12/2016 18:49    EKG: Independently reviewed. Vent. rate 124 BPM PR interval * ms QRS duration 106 ms QT/QTc 326/469 ms P-R-T axes 92 0 107 Sinus tachycardia Anteroseptal infarct, old Repol abnrm suggests ischemia, anterolateral  Assessment/Plan Principal Problem:   Chest pain   NSTEMI (non-ST elevated myocardial infarction) (HCC) Admit to  stepdown at [facility]/inpatient. Continue supplemental oxygen. Continue close cardiac telemetry. Continue daily aspirin. Continue heparin infusion. Will defer beta blocker at this time due to hypotension. Trend troponin levels. Follow-up EKG. Please reconsult cardiology once the patient arrives to [facility].  Active Problems:   CAP (community acquired pneumonia) Continue supplemental oxygen and bronchodilators. Continue azithromycin and ceftriaxone every 24 hours. Follow-up blood cultures and sensitivity. Check sputum Gram stain, culture and sensitivity. Check strep pneumoniae urinary antigen.    COPD with acute exacerbation (HCC) Continue supplemental oxygen. Continue Xopenex and ipratropium every 6 hours. Will give low-dose steroids.    GERD (gastroesophageal reflux disease) Protonix 40 mg by mouth daily.     DVT prophylaxis: On heparin infusion. Code Status: Full code. Family Communication:  Disposition Plan: Ad[facility] to [facility] for cardiology evaluation, possible cath and antibiotic therapy for several days. Consults called:  Admission status: Inpatient/SDU.   Reubin Milan MD [facility] Pager [phone].  If 7PM-7AM, please contact night-coverage [email or web address] Password [facility]  12/12/2016, 11:59 PM   This document was prepared using Dragon voice recognition software and may contain some unintended errors.

## 2016-12-12 NOTE — ED Notes (Signed)
Dr Armandina Gemma informed of trop

## 2016-12-12 NOTE — ED Triage Notes (Signed)
Pt has increased SOB and chest pain, hx COPD

## 2016-12-13 ENCOUNTER — Inpatient Hospital Stay (HOSPITAL_COMMUNITY): Payer: BLUE CROSS/BLUE SHIELD

## 2016-12-13 DIAGNOSIS — I259 Chronic ischemic heart disease, unspecified: Secondary | ICD-10-CM

## 2016-12-13 DIAGNOSIS — I214 Non-ST elevation (NSTEMI) myocardial infarction: Principal | ICD-10-CM

## 2016-12-13 DIAGNOSIS — J181 Lobar pneumonia, unspecified organism: Secondary | ICD-10-CM

## 2016-12-13 DIAGNOSIS — R079 Chest pain, unspecified: Secondary | ICD-10-CM

## 2016-12-13 DIAGNOSIS — J90 Pleural effusion, not elsewhere classified: Secondary | ICD-10-CM

## 2016-12-13 LAB — ECHOCARDIOGRAM COMPLETE
AOPV: 0.54 m/s
AOVTI: 40.9 cm
AV Area VTI: 1.54 cm2
AV VEL mean LVOT/AV: 0.53
AV area mean vel ind: 0.77 cm2/m2
AV vel: 1.56
AVAREAMEANV: 1.51 cm2
AVAREAVTIIND: 0.79 cm2/m2
AVG: 13 mmHg
AVLVOTPG: 7 mmHg
AVPG: 22 mmHg
AVPKVEL: 236 cm/s
CHL CUP AV PEAK INDEX: 0.78
CHL CUP AV VALUE AREA INDEX: 0.79
CHL CUP DOP CALC LVOT VTI: 22.4 cm
CHL CUP LVOT MV VTI: 2.73
CHL CUP MV DEC (S): 116
DOP CAL AO MEAN VELOCITY: 168 cm/s
E decel time: 116 msec
E/e' ratio: 20.76
FS: 12 % — AB (ref 28–44)
Height: 64 in
IVS/LV PW RATIO, ED: 0.83
LA diam end sys: 43 mm
LA vol index: 21.4 mL/m2
LADIAMINDEX: 2.17 cm/m2
LASIZE: 43 mm
LAVOL: 42.3 mL
LAVOLA4C: 33.5 mL
LV E/e' medial: 20.76
LV PW d: 10.3 mm — AB (ref 0.6–1.1)
LV dias vol index: 48 mL/m2
LV dias vol: 96 mL (ref 46–106)
LV sys vol index: 39 mL/m2
LV sys vol: 76 mL — AB (ref 14–42)
LVEEAVG: 20.76
LVELAT: 6.31 cm/s
LVOT MV VTI INDEX: 1.38 cm2/m2
LVOT SV: 64 mL
LVOT area: 2.84 cm2
LVOT peak vel: 128 cm/s
LVOTD: 19 mm
LVOTVTI: 0.55 cm
MV Annulus VTI: 23.3 cm
MV M vel: 81.8
MV Peak grad: 7 mmHg
MVPKAVEL: 65.3 m/s
MVPKEVEL: 131 m/s
Mean grad: 3 mmHg
PISA EROA: 0.1 cm2
RV LATERAL S' VELOCITY: 6.96 cm/s
RV TAPSE: 21.9 mm
RV sys press: 47 mmHg
Reg peak vel: 285 cm/s
Simpson's disk: 20
Stroke v: 19 ml
TDI e' lateral: 6.31
TDI e' medial: 6.2
TRMAXVEL: 285 cm/s
VTI: 109 cm
Valve area: 1.56 cm2
WEIGHTICAEL: 2970.04 [oz_av]

## 2016-12-13 LAB — BASIC METABOLIC PANEL
Anion gap: 11 (ref 5–15)
BUN: 9 mg/dL (ref 6–20)
CALCIUM: 8.7 mg/dL — AB (ref 8.9–10.3)
CO2: 26 mmol/L (ref 22–32)
Chloride: 98 mmol/L — ABNORMAL LOW (ref 101–111)
Creatinine, Ser: 0.64 mg/dL (ref 0.44–1.00)
GFR calc Af Amer: >60 mL/min (ref >60)
GLUCOSE: 150 mg/dL — AB (ref 65–99)
Potassium: 4.1 mmol/L (ref 3.5–5.1)
Sodium: 135 mmol/L (ref 135–145)

## 2016-12-13 LAB — HEPARIN LEVEL (UNFRACTIONATED)
HEPARIN UNFRACTIONATED: 0.93 [IU]/mL — AB (ref 0.30–0.70)
HEPARIN UNFRACTIONATED: 0.52 [IU]/mL (ref 0.30–0.70)
Heparin Unfractionated: 0.52 IU/mL (ref 0.30–0.70)

## 2016-12-13 LAB — LIPID PANEL
CHOL/HDL RATIO: 3 ratio
Cholesterol: 205 mg/dL — ABNORMAL HIGH (ref 0–200)
HDL: 68 mg/dL (ref >40)
LDL CALC: 117 mg/dL — AB (ref 0–99)
Triglycerides: 101 mg/dL (ref <150)
VLDL: 20 mg/dL (ref 0–40)

## 2016-12-13 LAB — CBC
HCT: 40.3 % (ref 36.0–46.0)
Hemoglobin: 12.9 g/dL (ref 12.0–15.0)
MCH: 28.5 pg (ref 26.0–34.0)
MCHC: 32 g/dL (ref 30.0–36.0)
MCV: 89.2 fL (ref 78.0–100.0)
PLATELETS: 213 10*3/uL (ref 150–400)
RBC: 4.52 MIL/uL (ref 3.87–5.11)
RDW: 14.6 % (ref 11.5–15.5)
WBC: 6.3 10*3/uL (ref 4.0–10.5)

## 2016-12-13 LAB — STREP PNEUMONIAE URINARY ANTIGEN: STREP PNEUMO URINARY ANTIGEN: NEGATIVE

## 2016-12-13 LAB — MAGNESIUM: Magnesium: 2.3 mg/dL (ref 1.7–2.4)

## 2016-12-13 LAB — TROPONIN I: Troponin I: 2.02 ng/mL (ref <0.03)

## 2016-12-13 LAB — MRSA PCR SCREENING: MRSA BY PCR: NEGATIVE

## 2016-12-13 LAB — PROCALCITONIN

## 2016-12-13 MED ORDER — NITROGLYCERIN 0.4 MG SL SUBL
0.4000 mg | SUBLINGUAL_TABLET | SUBLINGUAL | Status: DC | PRN
  Administered 2016-12-13 (×3): 0.4 mg via SUBLINGUAL
  Filled 2016-12-13: qty 1

## 2016-12-13 MED ORDER — SODIUM CHLORIDE 0.9 % IV SOLN: 250.0000 mL | INTRAVENOUS | Status: DC | PRN

## 2016-12-13 MED ORDER — PERFLUTREN LIPID MICROSPHERE
1.0000 mL | INTRAVENOUS | Status: AC | PRN
  Administered 2016-12-13: 2 mL via INTRAVENOUS
  Filled 2016-12-13: qty 10

## 2016-12-13 MED ORDER — ASPIRIN EC 81 MG PO TBEC: 81.0000 mg | DELAYED_RELEASE_TABLET | Freq: Every day | ORAL | Status: DC

## 2016-12-13 MED ORDER — METHYLPREDNISOLONE SODIUM SUCC 125 MG IJ SOLR
125.0000 mg | Freq: Once | INTRAMUSCULAR | Status: AC
  Administered 2016-12-13: 125 mg via INTRAVENOUS
  Filled 2016-12-13: qty 2

## 2016-12-13 MED ORDER — SODIUM CHLORIDE 0.9 % WEIGHT BASED INFUSION
1.0000 mL/kg/h | INTRAVENOUS | Status: DC
  Administered 2016-12-14: 1 mL/kg/h via INTRAVENOUS

## 2016-12-13 MED ORDER — ACETAMINOPHEN 325 MG PO TABS: 650.0000 mg | ORAL_TABLET | Freq: Four times a day (QID) | ORAL | Status: DC | PRN

## 2016-12-13 MED ORDER — SODIUM CHLORIDE 0.9% FLUSH
3.0000 mL | INTRAVENOUS | Status: DC | PRN
  Administered 2016-12-13: 3 mL via INTRAVENOUS
  Filled 2016-12-13: qty 3

## 2016-12-13 MED ORDER — SODIUM CHLORIDE 0.9% FLUSH
3.0000 mL | Freq: Two times a day (BID) | INTRAVENOUS | Status: DC
  Administered 2016-12-13: 3 mL via INTRAVENOUS

## 2016-12-13 MED ORDER — ALPRAZOLAM 0.5 MG PO TABS
0.5000 mg | ORAL_TABLET | Freq: Three times a day (TID) | ORAL | Status: DC | PRN
  Administered 2016-12-13 (×4): 0.5 mg via ORAL
  Filled 2016-12-13 (×4): qty 1

## 2016-12-13 MED ORDER — SODIUM CHLORIDE 0.9 % WEIGHT BASED INFUSION
3.0000 mL/kg/h | INTRAVENOUS | Status: DC
  Administered 2016-12-14: 3 mL/kg/h via INTRAVENOUS

## 2016-12-13 MED ORDER — VITAMIN D 1000 UNITS PO TABS
2000.0000 [IU] | ORAL_TABLET | Freq: Every day | ORAL | Status: DC
  Administered 2016-12-13: 2000 [IU] via ORAL
  Filled 2016-12-13: qty 2

## 2016-12-13 MED ORDER — LEVALBUTEROL HCL 0.63 MG/3ML IN NEBU
0.6300 mg | INHALATION_SOLUTION | RESPIRATORY_TRACT | Status: DC | PRN
  Administered 2016-12-14 – 2016-12-18 (×2): 0.63 mg via RESPIRATORY_TRACT
  Filled 2016-12-13 (×3): qty 3

## 2016-12-13 MED ORDER — SODIUM CHLORIDE 0.9 % IV SOLN
INTRAVENOUS | Status: DC
  Administered 2016-12-13 – 2016-12-14 (×2): via INTRAVENOUS

## 2016-12-13 MED ORDER — MOMETASONE FURO-FORMOTEROL FUM 200-5 MCG/ACT IN AERO
2.0000 | INHALATION_SPRAY | Freq: Two times a day (BID) | RESPIRATORY_TRACT | Status: DC
  Administered 2016-12-13: 2 via RESPIRATORY_TRACT
  Filled 2016-12-13 (×2): qty 8.8

## 2016-12-13 MED ORDER — PANTOPRAZOLE SODIUM 40 MG PO TBEC
40.0000 mg | DELAYED_RELEASE_TABLET | Freq: Every day | ORAL | Status: DC
  Administered 2016-12-13: 40 mg via ORAL
  Filled 2016-12-13: qty 1

## 2016-12-13 MED ORDER — IPRATROPIUM BROMIDE 0.02 % IN SOLN
0.5000 mg | Freq: Three times a day (TID) | RESPIRATORY_TRACT | Status: DC
  Administered 2016-12-14 – 2016-12-15 (×2): 0.5 mg via RESPIRATORY_TRACT
  Filled 2016-12-13 (×3): qty 2.5

## 2016-12-13 MED ORDER — DEXTROSE 5 % IV SOLN
500.0000 mg | INTRAVENOUS | Status: DC
  Administered 2016-12-13 – 2016-12-14 (×2): 500 mg via INTRAVENOUS
  Filled 2016-12-13 (×2): qty 500

## 2016-12-13 MED ORDER — METOPROLOL TARTRATE 25 MG PO TABS
25.0000 mg | ORAL_TABLET | Freq: Two times a day (BID) | ORAL | Status: DC
  Administered 2016-12-13 (×2): 25 mg via ORAL
  Filled 2016-12-13 (×2): qty 1

## 2016-12-13 MED ORDER — ASPIRIN 81 MG PO CHEW
81.0000 mg | CHEWABLE_TABLET | ORAL | Status: AC
  Administered 2016-12-14: 81 mg via ORAL
  Filled 2016-12-13: qty 1

## 2016-12-13 MED ORDER — LEVALBUTEROL HCL 1.25 MG/0.5ML IN NEBU
1.2500 mg | INHALATION_SOLUTION | Freq: Three times a day (TID) | RESPIRATORY_TRACT | Status: DC
  Administered 2016-12-14 – 2016-12-15 (×2): 1.25 mg via RESPIRATORY_TRACT
  Filled 2016-12-13 (×3): qty 0.5

## 2016-12-13 MED ORDER — DEXTROSE 5 % IV SOLN
1.0000 g | INTRAVENOUS | Status: AC
  Administered 2016-12-13 – 2016-12-18 (×6): 1 g via INTRAVENOUS
  Filled 2016-12-13 (×6): qty 10

## 2016-12-13 MED ORDER — ATORVASTATIN CALCIUM 10 MG PO TABS
10.0000 mg | ORAL_TABLET | Freq: Every day | ORAL | Status: DC
  Administered 2016-12-13: 10 mg via ORAL
  Filled 2016-12-13: qty 1

## 2016-12-13 NOTE — Progress Notes (Signed)
ANTICOAGULATION CONSULT NOTE - Follow Up Consult  Pharmacy Consult for Heparin Indication: chest pain/ACS  No Known Allergies  Patient Measurements: Height: 5\' 4"  (162.6 cm) Weight: 185 lb 10 oz (84.2 kg) IBW/kg (Calculated) : 54.7 Heparin Dosing Weight:  73.1 kg  Vital Signs: Temp: 98.7 F (37.1 C) (10/29 1612) Temp Source: Oral (10/29 1612) BP: 99/75 (10/29 1612) Pulse Rate: 105 (10/29 1612)  Labs:  Recent Labs  12/12/16 1803 12/12/16 1854 12/12/16 1856 12/12/16 1857 12/12/16 2011 12/12/16 2301 12/13/16 0554 12/13/16 1023 12/13/16 1653  HGB 13.6  --   --   --  15.0  --  12.9  --   --   HCT 42.1  --   --   --  44.0  --  40.3  --   --   PLT 233  --   --   --   --   --  213  --   --   LABPROT  --  13.2  --   --   --   --   --   --   --   INR  --  1.01  --   --   --   --   --   --   --   HEPARINUNFRC  --   --   --   --   --   --  0.93* 0.52 0.52  CREATININE  --   --   --  0.58 0.50  --   --  0.64  --   TROPONINI  --   --  2.20*  --   --  2.15* 2.02*  --   --     Estimated Creatinine Clearance: 75.6 mL/min (by C-G formula based on SCr of 0.64 mg/dL).   Assessment: Anticoag: none pta - iv hep for r/o ACS HL 0.92 high this AM, now 0.52 x 2 levels in goal  Goal of Therapy:  Heparin level 0.3-0.7 units/ml Monitor platelets by anticoagulation protocol: Yes   Plan:  Continue Heparin at 1000 units/hr Daily HL CBC   Emily Rocha, PharmD, BCPS Clinical Staff Pharmacist Pager [phone]  Emily Rocha 12/13/2016,6:05 PM

## 2016-12-13 NOTE — Progress Notes (Signed)
Shorter TEAM 1 - Stepdown/ICU TEAM  Emily Rocha  LDJ:[id number] DOB: 11-22-1955 DOA: 12/12/2016 PCP: Glenda Chroman, MD    Brief Narrative:  61 y.o. female with a history of asthma/COPD and GERD who presented to the ED with 3 months of progressively worsening off/on chest pressure associated with dyspnea.   In the ED she was found to have a troponin of 2.20.  CTangio was negative for PE.    Subjective: Resting comfortably in bed at the time of my visit.  Denies SSCP, but does c/o some L lateral "back pain" which she says feels like a pulled muscle.  Denies sob, n/v, or abdom pain.    Assessment & Plan:  Chest pain - elevated troponin - ST changes on EKG EKG appears ischemic - Cards following - for cardiac cath - no pain at this time   Possible PNA v/s Pulmonary Edema  I doubt an actual infectious PNA - cont empiric coverage for now - follow fever curve and WBC - check procalcitonin - low threshold to stop abx after 3 days of stable   COPD Well compensated at present  GERD  DVT prophylaxis: IV heparin  Code Status: FULL CODE Family Communication: no family present at time of exam  Disposition Plan: SDU  Consultants:  Cards  Procedures: none  Antimicrobials:  Azithro Rocephin   Objective: Blood pressure 109/83, pulse (!) 109, temperature 98.1 F (36.7 C), temperature source Oral, resp. rate 19, height 5\' 4"  (1.626 m), weight 84.2 kg (185 lb 10 oz), SpO2 97 %.  Intake/Output Summary (Last 24 hours) at 12/13/16 0851 Last data filed at 12/13/16 0731  Gross per 24 hour  Intake                0 ml  Output              100 ml  Net             -100 ml   Filed Weights   12/12/16 1753 12/13/16 0649  Weight: 81.6 kg (180 lb) 84.2 kg (185 lb 10 oz)    Examination: General: No acute respiratory distress Lungs: Clear to auscultation bilaterally without wheezes or crackles Cardiovascular: Regular rate and rhythm without murmur gallop or rub normal S1 and S2 Abdomen:  Nontender, nondistended, soft, bowel sounds positive, no rebound, no ascites, no appreciable mass Extremities: No significant cyanosis, clubbing, or edema bilateral lower extremities  CBC:  Recent Labs Lab 12/12/16 1803 12/12/16 2011 12/13/16 0554  WBC 9.5  --  6.3  NEUTROABS 7.2  --   --   HGB 13.6 15.0 12.9  HCT 42.1 44.0 40.3  MCV 90.1  --  89.2  PLT 233  --  030   Basic Metabolic Panel:  Recent Labs Lab 12/12/16 1857 12/12/16 2011 12/12/16 2301  NA 138 137  --   K 3.7 3.8  --   CL 100* 100*  --   CO2 24  --   --   GLUCOSE 115* 118*  --   BUN 11 10  --   CREATININE 0.58 0.50  --   CALCIUM 9.3  --   --   MG  --   --  1.9   GFR: Estimated Creatinine Clearance: 75.6 mL/min (by C-G formula based on SCr of 0.5 mg/dL).  Liver Function Tests:  Recent Labs Lab 12/12/16 1857  AST 53*  ALT 110*  ALKPHOS 101  BILITOT 1.1  PROT 7.7  ALBUMIN 4.1  Coagulation Profile:  Recent Labs Lab 12/12/16 1854  INR 1.01    Cardiac Enzymes:  Recent Labs Lab 12/12/16 1856 12/12/16 2301 12/13/16 0554  TROPONINI 2.20* 2.15* 2.02*     Recent Results (from the past 240 hour(s))  Culture, blood (routine x 2)     Status: None (Preliminary result)   Collection Time: 12/12/16  7:41 PM  Result Value Ref Range Status   Specimen Description BLOOD RIGHT ARM  Final   Special Requests   Final    BOTTLES DRAWN AEROBIC AND ANAEROBIC Blood Culture adequate volume   Culture NO GROWTH < 12 HOURS  Final   Report Status PENDING  Incomplete  Culture, blood (routine x 2)     Status: None (Preliminary result)   Collection Time: 12/12/16  7:49 PM  Result Value Ref Range Status   Specimen Description BLOOD RIGHT HAND  Final   Special Requests   Final    BOTTLES DRAWN AEROBIC AND ANAEROBIC Blood Culture adequate volume   Culture NO GROWTH < 12 HOURS  Final   Report Status PENDING  Incomplete     Scheduled Meds: . aspirin EC  81 mg Oral QHS  . atorvastatin  10 mg Oral Daily  .  cholecalciferol  2,000 Units Oral Daily  . ipratropium  0.5 mg Nebulization Q6H  . levalbuterol  1.25 mg Nebulization Q6H  . mometasone-formoterol  2 puff Inhalation BID  . pantoprazole  40 mg Oral Daily   Continuous Infusions: . sodium chloride    . azithromycin    . cefTRIAXone (ROCEPHIN)  IV    . heparin 1,000 Units/hr (12/13/16 0726)     LOS: 1 day   Cherene Altes, MD [facility] Office  [phone] Pager - Text Page per Amion as per below:  On-Call/Text Page:      [email or web address]      password [email or web address]  If 7PM-7AM, please contact night-coverage [email or web address] Password [email or web address] 12/13/2016, 8:51 AM

## 2016-12-13 NOTE — Progress Notes (Addendum)
Pt states that her pain has dropped to now between a 4 to 5 rating on scale of 1 to 10. Says pain is lightening up a lot started IV of NS at 10 cc/hr per chest  pain protocol BP 103/75 HR ST 102

## 2016-12-13 NOTE — Progress Notes (Signed)
Text page to DR Thereasa Solo to make aware of chest pain and interventions done

## 2016-12-13 NOTE — Consult Note (Signed)
Cardiology Consultation:   Patient ID: Emily Rocha; [id number]; Jun 24, 1955   Admit date: 12/12/2016 Date of Consult: 12/13/2016  Primary Care Provider: Glenda Chroman, MD Primary Cardiologist: Dr. Harl Bowie Primary Electrophysiologist:     Patient Profile:   Emily Rocha is a 61 y.o. female with a hx of carotid artery stenosis s/p right CEA (12/06/14), former smoker, COPD, GERD, HLD, and PAD who is being seen today for the evaluation of elevated troponin at the request of Dr. Thereasa Solo.  History of Present Illness:   Emily Rocha is known to this service and last saw Dr. Harl Bowie in 2017. At that time, she reported chest pain. Echocardiogram and stress myoview were both normal and without signs of ischemia. She was diagnosed with hiatal hernia and attributed her chest pain to her hernia.   On my interview, she states that she has had substernal chest pain that radiated to her left chest and around to her back. She states that she has a 49-month history of this chest pain that started 30 min after eating. She also states that the chest pain would wake her up almost nightly - this was unrelated to a meal. She was [place] the beach over the weekend and had worsening shortness of breath and could not load her car with luggage. This was a change from her baseline and prompted her to report to [facility].  She was subsequently transferred to [facility] for further management. She had chest pain this morning that required 3 SL nitro for relief. Heparin drip running.   CTA negative for PE. ABX for possible PNA. Troponin peaked at 2.20, now down-trending. EKG changes with ST depression in lateral leads.   Past Medical History:  Diagnosis Date  . Asthma   . COPD (chronic obstructive pulmonary disease) ([place])   . GERD (gastroesophageal reflux disease)   . Shortness of breath dyspnea     Past Surgical History:  Procedure Laterality Date  . CHOLECYSTECTOMY    . COLONOSCOPY N/A 10/03/2014   Procedure: COLONOSCOPY;  Surgeon:  Rogene Houston, MD;  Location: [facility];  Service: Endoscopy;  Laterality: N/A;  730  . ENDARTERECTOMY Right 12/06/2014   Procedure: ENDARTERECTOMY CAROTid;  Surgeon: Elam Dutch, MD;  [facility] OR;  Service: Vascular;  Laterality: Right;  . ESOPHAGOGASTRODUODENOSCOPY N/A 12/03/2016   Procedure: ESOPHAGOGASTRODUODENOSCOPY (EGD);  Surgeon: Rogene Houston, MD;  Location: [facility];  Service: Endoscopy;  Laterality: N/A;  7:30  . PERIPHERAL VASCULAR CATHETERIZATION N/A 12/05/2014   Procedure:  Carotid Arch Angiography;  Surgeon: Conrad , MD;  Location: [facility];  Service: Cardiovascular;  Laterality: N/A;     Home Medications:  Prior to Admission medications   Medication Sig Start Date End Date Taking? Authorizing Provider  ALPRAZolam Duanne Moron) 0.5 MG tablet Take 0.5 mg by mouth 2 (two) times daily as needed for anxiety.    Yes [provider]  aspirin EC 81 MG tablet Take 81 mg by mouth at bedtime.    Yes [provider]  atorvastatin (LIPITOR) 10 MG tablet Take 1 tablet (10 mg total) by mouth daily. 12/07/14  Yes Alvia Grove, PA-C  Cholecalciferol (VITAMIN D) 2000 units CAPS Take 2,000 Units by mouth daily.   Yes [provider]  esomeprazole (NEXIUM) 40 MG capsule Take 1 capsule (40 mg total) by mouth 2 (two) times daily before a meal. 12/03/16  Yes Rehman, Mechele Dawley, MD  Multiple Vitamin (MULTIVITAMIN WITH MINERALS) TABS tablet  Take 1 tablet by mouth daily.   Yes [provider]  SYMBICORT 160-4.5 MCG/ACT inhaler Inhale 2 puffs into the lungs 2 (two) times daily.  07/29/14  Yes [provider]  VENTOLIN HFA 108 (90 BASE) MCG/ACT inhaler Inhale 2 puffs into the lungs every 6 (six) hours as needed for shortness of breath.  08/26/14  Yes [provider]  Pediatric Multivitamins-Iron (FLINTSTONES PLUS IRON) chewable tablet Chew 1 tablet by mouth 2 (two) times daily. Patient not taking: Reported on 12/12/2016  12/03/16   Rogene Houston, MD    Inpatient Medications: Scheduled Meds: . aspirin EC  81 mg Oral QHS  . atorvastatin  10 mg Oral Daily  . cholecalciferol  2,000 Units Oral Daily  . ipratropium  0.5 mg Nebulization Q6H  . levalbuterol  1.25 mg Nebulization Q6H  . mometasone-formoterol  2 puff Inhalation BID  . pantoprazole  40 mg Oral Daily   Continuous Infusions: . sodium chloride    . azithromycin    . cefTRIAXone (ROCEPHIN)  IV    . heparin 1,000 Units/hr (12/13/16 0726)   PRN Meds: acetaminophen, ALPRAZolam, nitroGLYCERIN, ondansetron **OR** ondansetron (ZOFRAN) IV  Allergies:   No Known Allergies  Social History:   Social History   Social History  . Marital status: Divorced    Spouse name: N/A  . Number of children: N/A  . Years of education: N/A   Occupational History  . Not on file.   Social History Main Topics  . Smoking status: Former Smoker    Packs/day: 2.00    Years: 40.00    Quit date: 10/03/2010  . Smokeless tobacco: Never Used     Comment: No longer using the Vapor  . Alcohol use No  . Drug use: No  . Sexual activity: Not on file   Other Topics Concern  . Not on file   Social History Narrative  . No narrative on file    Family History:    Family History  Problem Relation Age of Onset  . Heart attack Mother   . Cancer Mother   . Diabetes Mother   . Heart disease Mother   . Heart attack Father   . Heart disease Father      ROS:  Please see the history of present illness.  ROS  All other ROS reviewed and negative.     Physical Exam/Data:   Vitals:   12/13/16 0649 12/13/16 0731 12/13/16 0800 12/13/16 0836  BP:  113/88 109/83   Pulse:  (!) 117 (!) 109   Resp:  (!) 31 19   Temp: 98.7 F (37.1 C) 98.1 F (36.7 C)    TempSrc: Oral Oral    SpO2:  93% 93% 97%  Weight: 185 lb 10 oz (84.2 kg)     Height: 5\' 4"  (1.626 m)       Intake/Output Summary (Last 24 hours) at 12/13/16 0932 Last data filed at 12/13/16 0731  Gross per 24  hour  Intake                0 ml  Output              100 ml  Net             -100 ml   Filed Weights   12/12/16 1753 12/13/16 0649  Weight: 180 lb (81.6 kg) 185 lb 10 oz (84.2 kg)   Body mass index is 31.86 kg/m.  General:  Well nourished, well developed,  in no acute distress HEENT: normal Neck: no JVD Vascular: right carotid bruit Cardiac:  normal S1, S2; regular rhythm, tachycardic rate; no murmur  Lungs:  clear to auscultation bilaterally, no wheezing, rhonchi or rales, diminished in bases Abd: soft, nontender, no hepatomegaly  Ext: no edema Musculoskeletal:  No deformities, BUE and BLE strength normal and equal Skin: warm and dry  Neuro:  CNs 2-12 intact, no focal abnormalities noted Psych:  Normal affect   EKG:  The EKG was personally reviewed and demonstrates:  ST depression II, AVF, V4/5/6 Telemetry:  Telemetry was personally reviewed and demonstrates:  Sinus tachycardia   Relevant CV Studies:  Myoview 07/10/15:  Equivocal ST segment depression, 0.5 mm in leads II, III, aVF, and V4 through V6. Resolved quickly in recovery. Intermediate risk Duke treadmill score of 1.5 based on limited exercise time and nondiagnostic ST segment change.  Blood pressure demonstrated a hypertensive response to exercise.  Small, mild intensity, basal septal defect that exhibits partial reversibility, suspect related to variable soft tissue attenuation rather than ischemia.  This is a low risk study based on perfusion imaging.  Nuclear stress EF: 75%.   Echo 07/01/15: Study Conclusions - Left ventricle: The cavity size was normal. Wall thickness was   increased in a pattern of mild concentric LVH, with moderate   focal basal septal hypertrophy. Systolic function was normal. The   estimated ejection fraction was in the range of 60% to 65%. Wall   motion was normal; there were no regional wall motion   abnormalities. Doppler parameters are consistent with abnormal   left ventricular  relaxation (grade 1 diastolic dysfunction).   Indeterminate filling pressures. - Aortic valve: Mildly to moderately calcified annulus. Mildly   thickened, mildly calcified leaflets. There was mild stenosis.   Peak velocity (S): 231 cm/s. Mean gradient (S): 9 mm Hg. Valve   area (VTI): 1.69 cm^2. Valve area (Vmean): 1.69 cm^2. - Mitral valve: Mildly calcified annulus. Normal thickness leaflets   Laboratory Data:  Chemistry Recent Labs Lab 12/12/16 1857 12/12/16 2011  NA 138 137  K 3.7 3.8  CL 100* 100*  CO2 24  --   GLUCOSE 115* 118*  BUN 11 10  CREATININE 0.58 0.50  CALCIUM 9.3  --   GFRNONAA >60  --   GFRAA >60  --   ANIONGAP 14  --      Recent Labs Lab 12/12/16 1857  PROT 7.7  ALBUMIN 4.1  AST 53*  ALT 110*  ALKPHOS 101  BILITOT 1.1   Hematology Recent Labs Lab 12/12/16 1803 12/12/16 2011 12/13/16 0554  WBC 9.5  --  6.3  RBC 4.67  --  4.52  HGB 13.6 15.0 12.9  HCT 42.1 44.0 40.3  MCV 90.1  --  89.2  MCH 29.1  --  28.5  MCHC 32.3  --  32.0  RDW 14.7  --  14.6  PLT 233  --  213   Cardiac Enzymes Recent Labs Lab 12/12/16 1856 12/12/16 2301 12/13/16 0554  TROPONINI 2.20* 2.15* 2.02*    Recent Labs Lab 12/12/16 1812  TROPIPOC 1.60*    BNP Recent Labs Lab 12/12/16 1857  BNP 458.0*    DDimer No results for input(s): DDIMER in the last 168 hours.  Radiology/Studies:  Ct Angio Chest Pe W And/or Wo Contrast  Result Date: 12/12/2016 CLINICAL DATA:  Increase shortness of breath and chest pain EXAM: CT ANGIOGRAPHY CHEST WITH CONTRAST TECHNIQUE: Multidetector CT imaging of the chest was performed using the  standard protocol during bolus administration of intravenous contrast. Multiplanar CT image reconstructions and MIPs were obtained to evaluate the vascular anatomy. CONTRAST:  100 mL Isovue 370 intravenous COMPARISON:  Radiograph 12/12/2016 FINDINGS: Cardiovascular: Respiratory motion artifact limits evaluation for pulmonary emboli particularly in  the lower lobes. Allowing for this, no definite acute filling defects are seen within the central or segmental pulmonary vessels to suggest acute embolus. Aortic atherosclerosis. No aneurysmal dilatation. Coronary artery calcification. Borderline to mild cardiomegaly. No pericardial effusion. Reflux of contrast into the hepatic veins consistent with elevated right heart pressure Mediastinum/Nodes: Midline trachea. No thyroid mass. Nonspecific subcentimeter mediastinal lymph nodes. Moderate distal hiatal hernia Lungs/Pleura: Moderate emphysema. Small right greater than left pleural effusions. Ground-glass densities within the right middle lobe, and right greater than left lower lobes. Partial consolidations within both lower lobes. Upper Abdomen: Surgical clips in the right upper quadrant. Fat containing ventral hernia. Musculoskeletal: Degenerative changes. No acute or suspicious bone lesion Review of the MIP images confirms the above findings. IMPRESSION: 1. Respiratory motion artifact limits assessment for pulmonary emboli in the lower lobes. Allowing for motion, no acute pulmonary embolus is seen 2. Mild cardiomegaly. Small right greater than left pleural effusions with multifocal right greater than left ground-glass opacity which may reflect atypical distribution of edema or multifocal pneumonia. 3. Moderate emphysema Aortic Atherosclerosis (ICD10-I70.0) and Emphysema (ICD10-J43.9). Electronically Signed   By: Donavan Foil M.D.   On: 12/12/2016 21:07   Dg Chest Port 1 View  Result Date: 12/12/2016 CLINICAL DATA:  61 year old female with dyspnea. EXAM: PORTABLE CHEST 1 VIEW COMPARISON:  Chest radiograph dated 11/05/2016 FINDINGS: There is emphysematous changes of the lungs. There is diffuse interstitial prominence, progressed from prior radiograph and likely represent a degree of edema on a background of emphysema. Superimposed pneumonia is not excluded. Clinical correlation is recommended. There is no  pleural effusion or pneumothorax. There is stable mild cardiomegaly. No acute osseous pathology. IMPRESSION: 1. Mild cardiomegaly with findings of mild CHF. Superimposed pneumonia is not excluded. Clinical correlation is recommended. 2. Emphysema. Electronically Signed   By: Anner Crete M.D.   On: 12/12/2016 18:49    Assessment and Plan:   1. Elevated troponin with EKG changes - troponin 2.20 --> 2.15 --> 2.02 - EKG on admission with ST depression in II, AVF, V4/5/6 - EKG with ST depression I, II, AVF; ST depression V4/5/6 - previously normal EKG in 2016 - pt has risk factors for ACS, including former smoker, HLD, PAD, and carotid artery stenosis s/p rt CEA - CT chest with coronary artery calcification and mild cardiomegaly - on heparin drip from ED - has received 3 doses of nitro SL this morning for chest pain - she is currently chest pain free - will start lopressor 25 mg BID - echocardiogram ordred - BMP. Mg, lipid profile pending, she is NPO - will plan for heart catheterization tomorrow   2.  CAP, COPD - admitted with probable exacerbation vs PNA - continue ABX per primary team - CT chest: negative for PE - active cough - stopped smoking 7 years ago, but had an 80-pack year history, then used vaping pen, but quit 60 days ago   For questions or updates, please contact [facility] Please consult [email or web address] for contact info under Cardiology/STEMI.   Signed, Tami Lin Duke, PA  12/13/2016 9:32 AM   History and all data above reviewed.  Patient examined.  I agree with the findings as above.  The patient has chest pain that is  somewhat atypical with pain under her ribs.  Worse with breathing.  She has had worsening SOB with PND and orthopnea.  The patient exam reveals COR:RRR  ,  Lungs: Decreased breath sounds without wheezing or crackles  ,  Abd: Positive bowel sounds, no rebound no guarding, Ext No edema  .  All available labs, radiology testing, previous records  reviewed. Agree with documented assessment and plan. NQWMI:  Needs cardiac cath.  GRACE = 126, TIMI Score = 5.  Check echo today.  Cath electively in AM.  The patient understands that risks included but are not limited to stroke (1 in 1000), death (1 in 73), kidney failure [usually temporary] (1 in 500), bleeding (1 in 200), allergic reaction [possibly serious] (1 in 200).  The patient understands and agrees to proceed.   Continue current meds with Heparin, beta blocker and ASA.    Jeneen Rinks Marticia Reifschneider  11:05 AM  12/13/2016

## 2016-12-13 NOTE — Progress Notes (Signed)
Pt still c/o pain rated pain at still a "7" with a squeezing feeling says after first NTG she feels like she is breathing better given a 2nd NTG SL BP 95/79

## 2016-12-13 NOTE — Progress Notes (Signed)
Pt c/o chest pain Lt side radiating around rib cage to back up under her Lt shoulder blade stat 12 lead EKG done - medicated with 02 at 4 l N/C and 1 0.4 NTG SL. No c/o nausea or diaphoresis.

## 2016-12-13 NOTE — Progress Notes (Signed)
DR Hochrein aware that pt had diaphoretic period and some Shortness of Breath while laying on Left side during echo

## 2016-12-13 NOTE — Progress Notes (Signed)
Pt c/o Chest pain as a pressure squeezing feeling - rate as a "7" on pain scale of 1 to 10 ordered stat 12 lead EKG

## 2016-12-13 NOTE — ED Notes (Signed)
Spoke with [facility] sending a truck to transport pt to [facility].

## 2016-12-13 NOTE — Progress Notes (Signed)
  Echocardiogram 2D Echocardiogram has been performed.  Bobbye Charleston 12/13/2016, 1:43 PM

## 2016-12-14 ENCOUNTER — Encounter (HOSPITAL_COMMUNITY)
Admission: EM | Disposition: A | Discharge status: Home Health | Payer: Self-pay | Source: Home / Self Care | Attending: Cardiothoracic Surgery

## 2016-12-14 ENCOUNTER — Encounter (HOSPITAL_COMMUNITY): Payer: Self-pay | Admitting: Certified Registered Nurse Anesthetist

## 2016-12-14 ENCOUNTER — Inpatient Hospital Stay (HOSPITAL_COMMUNITY): Payer: BLUE CROSS/BLUE SHIELD | Admitting: Certified Registered Nurse Anesthetist

## 2016-12-14 ENCOUNTER — Inpatient Hospital Stay (HOSPITAL_COMMUNITY)
Admission: EM | Disposition: A | Discharge status: Home Health | Payer: Self-pay | Source: Home / Self Care | Attending: Cardiothoracic Surgery

## 2016-12-14 ENCOUNTER — Inpatient Hospital Stay (HOSPITAL_COMMUNITY): Payer: BLUE CROSS/BLUE SHIELD

## 2016-12-14 DIAGNOSIS — I2510 Atherosclerotic heart disease of native coronary artery without angina pectoris: Secondary | ICD-10-CM

## 2016-12-14 DIAGNOSIS — E785 Hyperlipidemia, unspecified: Secondary | ICD-10-CM

## 2016-12-14 DIAGNOSIS — Z951 Presence of aortocoronary bypass graft: Secondary | ICD-10-CM

## 2016-12-14 DIAGNOSIS — I200 Unstable angina: Secondary | ICD-10-CM | POA: Diagnosis present

## 2016-12-14 HISTORY — PX: TEE WITHOUT CARDIOVERSION: SHX5443

## 2016-12-14 HISTORY — PX: LEFT HEART CATH AND CORONARY ANGIOGRAPHY: CATH118249

## 2016-12-14 HISTORY — PX: CORONARY ARTERY BYPASS GRAFT: SHX141

## 2016-12-14 LAB — POCT I-STAT 3, ART BLOOD GAS (G3+)
ACID-BASE EXCESS: 2 mmol/L (ref 0.0–2.0)
ACID-BASE EXCESS: 2 mmol/L (ref 0.0–2.0)
Acid-Base Excess: 5 mmol/L — ABNORMAL HIGH (ref 0.0–2.0)
BICARBONATE: 27.9 mmol/L (ref 20.0–28.0)
BICARBONATE: 26.8 mmol/L (ref 20.0–28.0)
Bicarbonate: 29.7 mmol/L — ABNORMAL HIGH (ref 20.0–28.0)
Bicarbonate: 27.3 mmol/L (ref 20.0–28.0)
O2 SAT: 100 %
O2 SAT: 100 %
O2 SAT: 91 %
O2 Saturation: 100 %
PCO2 ART: 43.2 mmHg (ref 32.0–48.0)
PCO2 ART: 40.4 mmHg (ref 32.0–48.0)
PCO2 ART: 57.2 mmHg — AB (ref 32.0–48.0)
PH ART: 7.352 (ref 7.350–7.450)
PH ART: 7.445 (ref 7.350–7.450)
PH ART: 7.43 (ref 7.350–7.450)
PO2 ART: 300 mmHg — AB (ref 83.0–108.0)
TCO2: 29 mmol/L (ref 22–32)
TCO2: 31 mmol/L (ref 22–32)
TCO2: 28 mmol/L (ref 22–32)
TCO2: 29 mmol/L (ref 22–32)
pCO2 arterial: 50.2 mmHg — ABNORMAL HIGH (ref 32.0–48.0)
pH, Arterial: 7.286 — ABNORMAL LOW (ref 7.350–7.450)
pO2, Arterial: 324 mmHg — ABNORMAL HIGH (ref 83.0–108.0)
pO2, Arterial: 395 mmHg — ABNORMAL HIGH (ref 83.0–108.0)
pO2, Arterial: 68 mmHg — ABNORMAL LOW (ref 83.0–108.0)

## 2016-12-14 LAB — POCT I-STAT, CHEM 8
BUN: 22 mg/dL — ABNORMAL HIGH (ref 6–20)
BUN: 18 mg/dL (ref 6–20)
BUN: 18 mg/dL (ref 6–20)
BUN: 18 mg/dL (ref 6–20)
BUN: 17 mg/dL (ref 6–20)
BUN: 17 mg/dL (ref 6–20)
CALCIUM ION: 1.16 mmol/L (ref 1.15–1.40)
CALCIUM ION: 0.93 mmol/L — AB (ref 1.15–1.40)
CALCIUM ION: 1.17 mmol/L (ref 1.15–1.40)
CALCIUM ION: 0.94 mmol/L — AB (ref 1.15–1.40)
CALCIUM ION: 1 mmol/L — AB (ref 1.15–1.40)
CHLORIDE: 99 mmol/L — AB (ref 101–111)
CHLORIDE: 99 mmol/L — AB (ref 101–111)
CREATININE: 0.6 mg/dL (ref 0.44–1.00)
CREATININE: 0.5 mg/dL (ref 0.44–1.00)
Calcium, Ion: 1.09 mmol/L — ABNORMAL LOW (ref 1.15–1.40)
Chloride: 100 mmol/L — ABNORMAL LOW (ref 101–111)
Chloride: 96 mmol/L — ABNORMAL LOW (ref 101–111)
Chloride: 98 mmol/L — ABNORMAL LOW (ref 101–111)
Chloride: 99 mmol/L — ABNORMAL LOW (ref 101–111)
Creatinine, Ser: 0.6 mg/dL (ref 0.44–1.00)
Creatinine, Ser: 0.4 mg/dL — ABNORMAL LOW (ref 0.44–1.00)
Creatinine, Ser: 0.5 mg/dL (ref 0.44–1.00)
Creatinine, Ser: 0.5 mg/dL (ref 0.44–1.00)
GLUCOSE: 119 mg/dL — AB (ref 65–99)
GLUCOSE: 135 mg/dL — AB (ref 65–99)
GLUCOSE: 157 mg/dL — AB (ref 65–99)
Glucose, Bld: 127 mg/dL — ABNORMAL HIGH (ref 65–99)
Glucose, Bld: 165 mg/dL — ABNORMAL HIGH (ref 65–99)
Glucose, Bld: 177 mg/dL — ABNORMAL HIGH (ref 65–99)
HCT: 32 % — ABNORMAL LOW (ref 36.0–46.0)
HCT: 25 % — ABNORMAL LOW (ref 36.0–46.0)
HCT: 29 % — ABNORMAL LOW (ref 36.0–46.0)
HCT: 20 % — ABNORMAL LOW (ref 36.0–46.0)
HEMATOCRIT: 21 % — AB (ref 36.0–46.0)
HEMATOCRIT: 23 % — AB (ref 36.0–46.0)
HEMOGLOBIN: 9.9 g/dL — AB (ref 12.0–15.0)
HEMOGLOBIN: 6.8 g/dL — AB (ref 12.0–15.0)
HEMOGLOBIN: 7.1 g/dL — AB (ref 12.0–15.0)
HEMOGLOBIN: 7.8 g/dL — AB (ref 12.0–15.0)
Hemoglobin: 10.9 g/dL — ABNORMAL LOW (ref 12.0–15.0)
Hemoglobin: 8.5 g/dL — ABNORMAL LOW (ref 12.0–15.0)
POTASSIUM: 3.9 mmol/L (ref 3.5–5.1)
Potassium: 4.4 mmol/L (ref 3.5–5.1)
Potassium: 4.1 mmol/L (ref 3.5–5.1)
Potassium: 4.3 mmol/L (ref 3.5–5.1)
Potassium: 4.9 mmol/L (ref 3.5–5.1)
Potassium: 4.8 mmol/L (ref 3.5–5.1)
SODIUM: 134 mmol/L — AB (ref 135–145)
SODIUM: 136 mmol/L (ref 135–145)
SODIUM: 132 mmol/L — AB (ref 135–145)
SODIUM: 133 mmol/L — AB (ref 135–145)
SODIUM: 137 mmol/L (ref 135–145)
Sodium: 134 mmol/L — ABNORMAL LOW (ref 135–145)
TCO2: 31 mmol/L (ref 22–32)
TCO2: 27 mmol/L (ref 22–32)
TCO2: 29 mmol/L (ref 22–32)
TCO2: 27 mmol/L (ref 22–32)
TCO2: 26 mmol/L (ref 22–32)
TCO2: 28 mmol/L (ref 22–32)

## 2016-12-14 LAB — CBC
HCT: 27.5 % — ABNORMAL LOW (ref 36.0–46.0)
HEMATOCRIT: 39.7 % (ref 36.0–46.0)
HEMOGLOBIN: 9 g/dL — AB (ref 12.0–15.0)
Hemoglobin: 12.4 g/dL (ref 12.0–15.0)
MCH: 27.9 pg (ref 26.0–34.0)
MCH: 26.9 pg (ref 26.0–34.0)
MCHC: 31.2 g/dL (ref 30.0–36.0)
MCHC: 32.7 g/dL (ref 30.0–36.0)
MCV: 89.4 fL (ref 78.0–100.0)
MCV: 82.3 fL (ref 78.0–100.0)
PLATELETS: 256 10*3/uL (ref 150–400)
Platelets: 196 10*3/uL (ref 150–400)
RBC: 4.44 MIL/uL (ref 3.87–5.11)
RBC: 3.34 MIL/uL — ABNORMAL LOW (ref 3.87–5.11)
RDW: 14.4 % (ref 11.5–15.5)
RDW: 21.5 % — AB (ref 11.5–15.5)
WBC: 8.8 10*3/uL (ref 4.0–10.5)
WBC: 11.3 10*3/uL — ABNORMAL HIGH (ref 4.0–10.5)

## 2016-12-14 LAB — BASIC METABOLIC PANEL
ANION GAP: 11 (ref 5–15)
BUN: 16 mg/dL (ref 6–20)
CALCIUM: 9 mg/dL (ref 8.9–10.3)
CHLORIDE: 96 mmol/L — AB (ref 101–111)
CO2: 28 mmol/L (ref 22–32)
Creatinine, Ser: 0.83 mg/dL (ref 0.44–1.00)
GFR calc Af Amer: >60 mL/min (ref >60)
GFR calc non Af Amer: >60 mL/min (ref >60)
GLUCOSE: 109 mg/dL — AB (ref 65–99)
Potassium: 4.1 mmol/L (ref 3.5–5.1)
Sodium: 135 mmol/L (ref 135–145)

## 2016-12-14 LAB — PROCALCITONIN

## 2016-12-14 LAB — GLUCOSE, CAPILLARY: Glucose-Capillary: 138 mg/dL — ABNORMAL HIGH (ref 65–99)

## 2016-12-14 LAB — PROTIME-INR
INR: 1.4
PROTHROMBIN TIME: 17.1 s — AB (ref 11.4–15.2)

## 2016-12-14 LAB — ECHO INTRAOPERATIVE TEE
Height: 64 in
Weight: 2970.04 oz

## 2016-12-14 LAB — APTT: aPTT: 32 seconds (ref 24–36)

## 2016-12-14 LAB — MAGNESIUM: Magnesium: 2.4 mg/dL (ref 1.7–2.4)

## 2016-12-14 LAB — PREPARE RBC (CROSSMATCH)

## 2016-12-14 LAB — PLATELET COUNT: Platelets: 142 10*3/uL — ABNORMAL LOW (ref 150–400)

## 2016-12-14 LAB — HEPARIN LEVEL (UNFRACTIONATED): Heparin Unfractionated: 0.45 IU/mL (ref 0.30–0.70)

## 2016-12-14 LAB — HEMOGLOBIN AND HEMATOCRIT, BLOOD
HCT: 22.9 % — ABNORMAL LOW (ref 36.0–46.0)
Hemoglobin: 7.6 g/dL — ABNORMAL LOW (ref 12.0–15.0)

## 2016-12-14 LAB — HIV ANTIBODY (ROUTINE TESTING W REFLEX): HIV SCREEN 4TH GENERATION: NONREACTIVE

## 2016-12-14 SURGERY — LEFT HEART CATH AND CORONARY ANGIOGRAPHY
Anesthesia: LOCAL

## 2016-12-14 SURGERY — CORONARY ARTERY BYPASS GRAFTING (CABG)
Anesthesia: General | Site: Chest

## 2016-12-14 MED ORDER — SODIUM CHLORIDE 0.9 % IV SOLN: 250.0000 mL | INTRAVENOUS | Status: DC

## 2016-12-14 MED ORDER — MOMETASONE FURO-FORMOTEROL FUM 100-5 MCG/ACT IN AERO
2.0000 | INHALATION_SPRAY | Freq: Two times a day (BID) | RESPIRATORY_TRACT | Status: DC
  Administered 2016-12-15 – 2016-12-26 (×23): 2 via RESPIRATORY_TRACT
  Filled 2016-12-14 (×2): qty 8.8

## 2016-12-14 MED ORDER — LACTATED RINGERS IV SOLN
INTRAVENOUS | Status: DC
  Administered 2016-12-16: 14:00:00 via INTRAVENOUS

## 2016-12-14 MED ORDER — ASPIRIN 81 MG PO CHEW: 324.0000 mg | CHEWABLE_TABLET | Freq: Every day | ORAL | Status: DC

## 2016-12-14 MED ORDER — DEXMEDETOMIDINE HCL IN NACL 400 MCG/100ML IV SOLN
0.1000 ug/kg/h | INTRAVENOUS | Status: AC
  Administered 2016-12-14: .5 ug/kg/h via INTRAVENOUS
  Filled 2016-12-14: qty 100

## 2016-12-14 MED ORDER — MORPHINE SULFATE (PF) 4 MG/ML IV SOLN
1.0000 mg | INTRAVENOUS | Status: DC | PRN
  Administered 2016-12-15 (×2): 4 mg via INTRAVENOUS
  Filled 2016-12-14: qty 1

## 2016-12-14 MED ORDER — METOPROLOL TARTRATE 25 MG/10 ML ORAL SUSPENSION: 12.5000 mg | Freq: Two times a day (BID) | ORAL | Status: DC

## 2016-12-14 MED ORDER — SODIUM CHLORIDE 0.9% FLUSH
3.0000 mL | Freq: Two times a day (BID) | INTRAVENOUS | Status: DC
  Administered 2016-12-15 – 2016-12-27 (×15): 3 mL via INTRAVENOUS

## 2016-12-14 MED ORDER — DEXTROSE 5 % IV SOLN: 1.5000 g | INTRAVENOUS | Status: DC

## 2016-12-14 MED ORDER — LIDOCAINE 2% (20 MG/ML) 5 ML SYRINGE
INTRAMUSCULAR | Status: AC
  Filled 2016-12-14: qty 5

## 2016-12-14 MED ORDER — ASPIRIN EC 325 MG PO TBEC
325.0000 mg | DELAYED_RELEASE_TABLET | Freq: Every day | ORAL | Status: DC
  Administered 2016-12-16: 325 mg via ORAL
  Filled 2016-12-14: qty 1

## 2016-12-14 MED ORDER — SODIUM CHLORIDE 0.9% FLUSH: 3.0000 mL | INTRAVENOUS | Status: DC | PRN

## 2016-12-14 MED ORDER — VANCOMYCIN HCL IN DEXTROSE 1-5 GM/200ML-% IV SOLN
1000.0000 mg | Freq: Once | INTRAVENOUS | Status: AC
  Administered 2016-12-15: 1000 mg via INTRAVENOUS
  Filled 2016-12-14: qty 200

## 2016-12-14 MED ORDER — HEPARIN SODIUM (PORCINE) 1000 UNIT/ML IJ SOLN
INTRAMUSCULAR | Status: AC
  Filled 2016-12-14: qty 1

## 2016-12-14 MED ORDER — ACETAMINOPHEN 500 MG PO TABS
1000.0000 mg | ORAL_TABLET | Freq: Four times a day (QID) | ORAL | Status: AC
  Administered 2016-12-16 – 2016-12-19 (×13): 1000 mg via ORAL
  Filled 2016-12-14 (×14): qty 2

## 2016-12-14 MED ORDER — SODIUM CHLORIDE 0.9 % IV SOLN
1.5000 mg/kg/h | INTRAVENOUS | Status: DC
  Filled 2016-12-14: qty 25

## 2016-12-14 MED ORDER — VANCOMYCIN HCL 10 G IV SOLR
1250.0000 mg | INTRAVENOUS | Status: AC
  Administered 2016-12-14: 1250 mg via INTRAVENOUS
  Filled 2016-12-14: qty 1250

## 2016-12-14 MED ORDER — AMIODARONE LOAD VIA INFUSION
150.0000 mg | Freq: Once | INTRAVENOUS | Status: DC
  Filled 2016-12-14: qty 83.34

## 2016-12-14 MED ORDER — POTASSIUM CHLORIDE 10 MEQ/50ML IV SOLN
10.0000 meq | INTRAVENOUS | Status: AC
  Administered 2016-12-14: 10 meq via INTRAVENOUS
  Filled 2016-12-14: qty 50

## 2016-12-14 MED ORDER — DOPAMINE-DEXTROSE 3.2-5 MG/ML-% IV SOLN
0.0000 ug/kg/min | INTRAVENOUS | Status: DC
  Filled 2016-12-14: qty 250

## 2016-12-14 MED ORDER — CHLORHEXIDINE GLUCONATE 0.12 % MT SOLN
15.0000 mL | OROMUCOSAL | Status: AC
  Administered 2016-12-14: 15 mL via OROMUCOSAL

## 2016-12-14 MED ORDER — FENTANYL CITRATE (PF) 250 MCG/5ML IJ SOLN
INTRAMUSCULAR | Status: AC
  Filled 2016-12-14: qty 10

## 2016-12-14 MED ORDER — SODIUM CHLORIDE 0.9 % IV SOLN: INTRAVENOUS | Status: AC

## 2016-12-14 MED ORDER — SUCCINYLCHOLINE CHLORIDE 200 MG/10ML IV SOSY
PREFILLED_SYRINGE | INTRAVENOUS | Status: AC
  Filled 2016-12-14: qty 10

## 2016-12-14 MED ORDER — FENTANYL CITRATE (PF) 100 MCG/2ML IJ SOLN: 25.0000 ug | INTRAMUSCULAR | Status: DC | PRN

## 2016-12-14 MED ORDER — LACTATED RINGERS IV SOLN
INTRAVENOUS | Status: DC | PRN
  Administered 2016-12-14: 15:00:00 via INTRAVENOUS

## 2016-12-14 MED ORDER — MILRINONE LACTATE IN DEXTROSE 20-5 MG/100ML-% IV SOLN
0.1250 ug/kg/min | INTRAVENOUS | Status: AC
  Administered 2016-12-14: .5 ug/kg/min via INTRAVENOUS
  Filled 2016-12-14: qty 100

## 2016-12-14 MED ORDER — DOCUSATE SODIUM 100 MG PO CAPS
200.0000 mg | ORAL_CAPSULE | Freq: Every day | ORAL | Status: DC
  Administered 2016-12-16 – 2016-12-27 (×10): 200 mg via ORAL
  Filled 2016-12-14 (×13): qty 2

## 2016-12-14 MED ORDER — NOREPINEPHRINE BITARTRATE 1 MG/ML IV SOLN
0.0000 ug/min | INTRAVENOUS | Status: AC
  Administered 2016-12-14: 4 ug/min via INTRAVENOUS
  Filled 2016-12-14: qty 4

## 2016-12-14 MED ORDER — INSULIN REGULAR BOLUS VIA INFUSION
0.0000 [IU] | Freq: Three times a day (TID) | INTRAVENOUS | Status: DC
  Filled 2016-12-14: qty 10

## 2016-12-14 MED ORDER — ACETAMINOPHEN 325 MG PO TABS: 325.0000 mg | ORAL_TABLET | ORAL | Status: DC | PRN

## 2016-12-14 MED ORDER — METOCLOPRAMIDE HCL 5 MG/ML IJ SOLN
10.0000 mg | Freq: Four times a day (QID) | INTRAMUSCULAR | Status: AC
  Administered 2016-12-15 – 2016-12-19 (×18): 10 mg via INTRAVENOUS
  Filled 2016-12-14 (×18): qty 2

## 2016-12-14 MED ORDER — THROMBIN (RECOMBINANT) 5000 UNITS EX SOLR
CUTANEOUS | Status: AC
  Filled 2016-12-14: qty 5000

## 2016-12-14 MED ORDER — CEFUROXIME SODIUM 750 MG IJ SOLR
750.0000 mg | INTRAMUSCULAR | Status: DC
  Filled 2016-12-14: qty 750

## 2016-12-14 MED ORDER — DEXTROSE 5 % IV SOLN
1.5000 g | INTRAVENOUS | Status: AC
  Administered 2016-12-14: .75 g via INTRAVENOUS
  Administered 2016-12-14: 1.5 g via INTRAVENOUS
  Filled 2016-12-14: qty 1.5

## 2016-12-14 MED ORDER — FENTANYL CITRATE (PF) 250 MCG/5ML IJ SOLN
INTRAMUSCULAR | Status: AC
  Filled 2016-12-14: qty 5

## 2016-12-14 MED ORDER — DEXMEDETOMIDINE HCL IN NACL 400 MCG/100ML IV SOLN
0.1000 ug/kg/h | INTRAVENOUS | Status: DC
  Filled 2016-12-14: qty 100

## 2016-12-14 MED ORDER — HEPARIN SODIUM (PORCINE) 1000 UNIT/ML IJ SOLN
INTRAMUSCULAR | Status: DC | PRN
  Administered 2016-12-14: 2000 [IU] via INTRAVENOUS
  Administered 2016-12-14: 27000 [IU] via INTRAVENOUS

## 2016-12-14 MED ORDER — ROCURONIUM BROMIDE 10 MG/ML (PF) SYRINGE
PREFILLED_SYRINGE | INTRAVENOUS | Status: DC | PRN
  Administered 2016-12-14 (×6): 50 mg via INTRAVENOUS

## 2016-12-14 MED ORDER — 0.9 % SODIUM CHLORIDE (POUR BTL) OPTIME
TOPICAL | Status: DC | PRN
  Administered 2016-12-14: 5000 mL

## 2016-12-14 MED ORDER — MIDAZOLAM HCL 2 MG/2ML IJ SOLN: 2.0000 mg | INTRAMUSCULAR | Status: DC | PRN

## 2016-12-14 MED ORDER — NITROGLYCERIN IN D5W 200-5 MCG/ML-% IV SOLN
2.0000 ug/min | INTRAVENOUS | Status: DC
  Filled 2016-12-14: qty 250

## 2016-12-14 MED ORDER — AMIODARONE HCL IN DEXTROSE 360-4.14 MG/200ML-% IV SOLN
30.0000 mg/h | INTRAVENOUS | Status: DC
  Filled 2016-12-14 (×3): qty 200

## 2016-12-14 MED ORDER — SODIUM CHLORIDE 0.9 % IV SOLN
0.0000 ug/min | INTRAVENOUS | Status: DC
  Administered 2016-12-15: 30 ug/min via INTRAVENOUS
  Filled 2016-12-14: qty 2

## 2016-12-14 MED ORDER — ACETAMINOPHEN 160 MG/5ML PO SOLN: 650.0000 mg | Freq: Once | ORAL | Status: AC

## 2016-12-14 MED ORDER — MIDAZOLAM HCL 2 MG/2ML IJ SOLN
INTRAMUSCULAR | Status: DC | PRN
  Administered 2016-12-14 (×3): 1 mg via INTRAVENOUS

## 2016-12-14 MED ORDER — EPHEDRINE 5 MG/ML INJ
INTRAVENOUS | Status: AC
  Filled 2016-12-14: qty 10

## 2016-12-14 MED ORDER — LIDOCAINE 2% (20 MG/ML) 5 ML SYRINGE
INTRAMUSCULAR | Status: DC | PRN
  Administered 2016-12-14: 60 mg via INTRAVENOUS

## 2016-12-14 MED ORDER — THROMBIN (RECOMBINANT) 5000 UNITS EX SOLR
CUTANEOUS | Status: DC | PRN
  Administered 2016-12-14: 5000 [IU] via TOPICAL

## 2016-12-14 MED ORDER — POTASSIUM CHLORIDE 2 MEQ/ML IV SOLN
80.0000 meq | INTRAVENOUS | Status: DC
  Filled 2016-12-14: qty 40

## 2016-12-14 MED ORDER — ALBUMIN HUMAN 5 % IV SOLN
250.0000 mL | INTRAVENOUS | Status: AC | PRN
Start: 2016-12-14 — End: 2016-12-15
  Filled 2016-12-14: qty 250

## 2016-12-14 MED ORDER — SODIUM CHLORIDE 0.9 % IV SOLN
INTRAVENOUS | Status: DC
  Filled 2016-12-14: qty 30

## 2016-12-14 MED ORDER — VERAPAMIL HCL 2.5 MG/ML IV SOLN
INTRAVENOUS | Status: AC
  Filled 2016-12-14: qty 2

## 2016-12-14 MED ORDER — ACETAMINOPHEN 650 MG RE SUPP
650.0000 mg | Freq: Once | RECTAL | Status: AC
  Administered 2016-12-14: 650 mg via RECTAL

## 2016-12-14 MED ORDER — MAGNESIUM SULFATE 4 GM/100ML IV SOLN
4.0000 g | Freq: Once | INTRAVENOUS | Status: AC
  Administered 2016-12-14: 4 g via INTRAVENOUS
  Filled 2016-12-14: qty 100

## 2016-12-14 MED ORDER — SODIUM CHLORIDE 0.9 % IV SOLN
INTRAVENOUS | Status: DC
  Filled 2016-12-14: qty 1

## 2016-12-14 MED ORDER — ALBUMIN HUMAN 5 % IV SOLN
INTRAVENOUS | Status: DC | PRN
  Administered 2016-12-14: 19:00:00 via INTRAVENOUS

## 2016-12-14 MED ORDER — FAMOTIDINE IN NACL 20-0.9 MG/50ML-% IV SOLN
20.0000 mg | Freq: Two times a day (BID) | INTRAVENOUS | Status: AC
  Administered 2016-12-14 – 2016-12-15 (×2): 20 mg via INTRAVENOUS
  Filled 2016-12-14: qty 50

## 2016-12-14 MED ORDER — PHENYLEPHRINE 40 MCG/ML (10ML) SYRINGE FOR IV PUSH (FOR BLOOD PRESSURE SUPPORT)
PREFILLED_SYRINGE | INTRAVENOUS | Status: AC
  Filled 2016-12-14: qty 10

## 2016-12-14 MED ORDER — AMIODARONE HCL IN DEXTROSE 360-4.14 MG/200ML-% IV SOLN
60.0000 mg/h | INTRAVENOUS | Status: DC
  Administered 2016-12-14: 60 mg/h via INTRAVENOUS
  Filled 2016-12-14 (×2): qty 200

## 2016-12-14 MED ORDER — EPINEPHRINE PF 1 MG/ML IJ SOLN
0.0000 ug/min | INTRAVENOUS | Status: AC
  Administered 2016-12-14: 3 ug/min via INTRAVENOUS
  Filled 2016-12-14: qty 4

## 2016-12-14 MED ORDER — ROCURONIUM BROMIDE 10 MG/ML (PF) SYRINGE
PREFILLED_SYRINGE | INTRAVENOUS | Status: AC
  Filled 2016-12-14: qty 10

## 2016-12-14 MED ORDER — LACTATED RINGERS IV SOLN: 500.0000 mL | Freq: Once | INTRAVENOUS | Status: DC | PRN

## 2016-12-14 MED ORDER — SODIUM CHLORIDE 0.45 % IV SOLN: INTRAVENOUS | Status: DC | PRN

## 2016-12-14 MED ORDER — ONDANSETRON HCL 4 MG/2ML IJ SOLN
4.0000 mg | Freq: Four times a day (QID) | INTRAMUSCULAR | Status: DC | PRN
  Administered 2016-12-17 – 2016-12-25 (×5): 4 mg via INTRAVENOUS
  Filled 2016-12-14 (×6): qty 2

## 2016-12-14 MED ORDER — VERAPAMIL HCL 2.5 MG/ML IV SOLN
INTRAVENOUS | Status: DC | PRN
  Administered 2016-12-14: 10 mL via INTRA_ARTERIAL

## 2016-12-14 MED ORDER — FENTANYL CITRATE (PF) 100 MCG/2ML IJ SOLN
INTRAMUSCULAR | Status: AC
  Filled 2016-12-14: qty 2

## 2016-12-14 MED ORDER — FENTANYL CITRATE (PF) 100 MCG/2ML IJ SOLN
INTRAMUSCULAR | Status: DC | PRN
  Administered 2016-12-14 (×3): 25 ug via INTRAVENOUS

## 2016-12-14 MED ORDER — PLASMA-LYTE 148 IV SOLN
INTRAVENOUS | Status: AC
  Administered 2016-12-14: 500 mL
  Filled 2016-12-14 (×2): qty 2.5

## 2016-12-14 MED ORDER — POTASSIUM CHLORIDE 10 MEQ/50ML IV SOLN
10.0000 meq | INTRAVENOUS | Status: AC
  Administered 2016-12-14 (×2): 10 meq via INTRAVENOUS

## 2016-12-14 MED ORDER — SODIUM CHLORIDE 0.9 % IJ SOLN
OROMUCOSAL | Status: DC | PRN
  Administered 2016-12-14 (×2): 4 mL via TOPICAL

## 2016-12-14 MED ORDER — ARTIFICIAL TEARS OPHTHALMIC OINT
TOPICAL_OINTMENT | OPHTHALMIC | Status: DC | PRN
  Administered 2016-12-14: 1 via OPHTHALMIC

## 2016-12-14 MED ORDER — IOPAMIDOL (ISOVUE-370) INJECTION 76%
INTRAVENOUS | Status: AC
  Filled 2016-12-14: qty 100

## 2016-12-14 MED ORDER — VANCOMYCIN HCL 10 G IV SOLR: 1250.0000 mg | INTRAVENOUS | Status: DC

## 2016-12-14 MED ORDER — TRAMADOL HCL 50 MG PO TABS
50.0000 mg | ORAL_TABLET | ORAL | Status: DC | PRN
  Administered 2016-12-17 (×2): 100 mg via ORAL
  Administered 2016-12-18 – 2016-12-21 (×5): 50 mg via ORAL
  Administered 2016-12-22 – 2016-12-27 (×17): 100 mg via ORAL
  Filled 2016-12-14: qty 2
  Filled 2016-12-14 (×2): qty 1
  Filled 2016-12-14: qty 2
  Filled 2016-12-14: qty 1
  Filled 2016-12-14: qty 2
  Filled 2016-12-14: qty 1
  Filled 2016-12-14 (×7): qty 2
  Filled 2016-12-14: qty 1
  Filled 2016-12-14 (×9): qty 2

## 2016-12-14 MED ORDER — PHENYLEPHRINE HCL 10 MG/ML IJ SOLN
INTRAMUSCULAR | Status: DC | PRN
  Administered 2016-12-14: 20 ug/min via INTRAVENOUS

## 2016-12-14 MED ORDER — IOPAMIDOL (ISOVUE-370) INJECTION 76%
INTRAVENOUS | Status: DC | PRN
  Administered 2016-12-14: 50 mL via INTRA_ARTERIAL

## 2016-12-14 MED ORDER — PHENYLEPHRINE HCL 10 MG/ML IJ SOLN
INTRAMUSCULAR | Status: DC | PRN
  Administered 2016-12-14: 80 ug via INTRAVENOUS
  Administered 2016-12-14: 120 ug via INTRAVENOUS

## 2016-12-14 MED ORDER — SODIUM CHLORIDE 0.9 % IV SOLN: Freq: Once | INTRAVENOUS | Status: DC

## 2016-12-14 MED ORDER — MIDAZOLAM HCL 2 MG/2ML IJ SOLN
INTRAMUSCULAR | Status: AC
  Filled 2016-12-14: qty 2

## 2016-12-14 MED ORDER — SODIUM CHLORIDE 0.9 % IV SOLN: INTRAVENOUS | Status: DC

## 2016-12-14 MED ORDER — OXYCODONE HCL 5 MG/5ML PO SOLN: 5.0000 mg | Freq: Once | ORAL | Status: DC | PRN

## 2016-12-14 MED ORDER — GELATIN ABSORBABLE MT POWD
OROMUCOSAL | Status: DC | PRN
  Administered 2016-12-14: 4 mL via TOPICAL

## 2016-12-14 MED ORDER — TRANEXAMIC ACID (OHS) PUMP PRIME SOLUTION
2.0000 mg/kg | INTRAVENOUS | Status: DC
  Filled 2016-12-14: qty 1.68

## 2016-12-14 MED ORDER — PHENYLEPHRINE 40 MCG/ML (10ML) SYRINGE FOR IV PUSH (FOR BLOOD PRESSURE SUPPORT)
PREFILLED_SYRINGE | INTRAVENOUS | Status: DC | PRN
  Administered 2016-12-14 (×3): 80 ug via INTRAVENOUS
  Administered 2016-12-14: 40 ug via INTRAVENOUS
  Administered 2016-12-14: 80 ug via INTRAVENOUS
  Administered 2016-12-14: 40 ug via INTRAVENOUS

## 2016-12-14 MED ORDER — ACETAMINOPHEN 160 MG/5ML PO SOLN: 325.0000 mg | ORAL | Status: DC | PRN

## 2016-12-14 MED ORDER — PROPOFOL 10 MG/ML IV BOLUS
INTRAVENOUS | Status: AC
  Filled 2016-12-14: qty 20

## 2016-12-14 MED ORDER — PROTAMINE SULFATE 10 MG/ML IV SOLN
INTRAVENOUS | Status: DC | PRN
  Administered 2016-12-14 (×2): 50 mg via INTRAVENOUS
  Administered 2016-12-14: 20 mg via INTRAVENOUS
  Administered 2016-12-14: 120 mg via INTRAVENOUS
  Administered 2016-12-14: 50 mg via INTRAVENOUS

## 2016-12-14 MED ORDER — EPINEPHRINE PF 1 MG/ML IJ SOLN
0.0000 ug/min | INTRAVENOUS | Status: DC
  Administered 2016-12-15: 5 ug/min via INTRAVENOUS
  Administered 2016-12-15 – 2016-12-17 (×3): 4 ug/min via INTRAVENOUS
  Filled 2016-12-14 (×4): qty 4

## 2016-12-14 MED ORDER — SODIUM CHLORIDE 0.9 % IV SOLN
INTRAVENOUS | Status: AC
  Administered 2016-12-14: 1.3 [IU]/h via INTRAVENOUS
  Filled 2016-12-14: qty 1

## 2016-12-14 MED ORDER — HEMOSTATIC AGENTS (NO CHARGE) OPTIME
TOPICAL | Status: DC | PRN
  Administered 2016-12-14: 1 via TOPICAL

## 2016-12-14 MED ORDER — LIDOCAINE HCL (CARDIAC) 20 MG/ML IV SOLN
INTRAVENOUS | Status: AC
  Filled 2016-12-14: qty 5

## 2016-12-14 MED ORDER — CHLORHEXIDINE GLUCONATE 0.12 % MT SOLN: 15.0000 mL | Freq: Once | OROMUCOSAL | Status: DC

## 2016-12-14 MED ORDER — MIDAZOLAM HCL 10 MG/2ML IJ SOLN
INTRAMUSCULAR | Status: AC
  Filled 2016-12-14: qty 2

## 2016-12-14 MED ORDER — MORPHINE SULFATE (PF) 4 MG/ML IV SOLN
2.0000 mg | INTRAVENOUS | Status: DC | PRN
  Administered 2016-12-15: 2 mg via INTRAVENOUS
  Administered 2016-12-15: 4 mg via INTRAVENOUS
  Administered 2016-12-15 – 2016-12-16 (×4): 2 mg via INTRAVENOUS
  Filled 2016-12-14 (×7): qty 1

## 2016-12-14 MED ORDER — PROPOFOL 10 MG/ML IV BOLUS
INTRAVENOUS | Status: DC | PRN
  Administered 2016-12-14: 30 mg via INTRAVENOUS

## 2016-12-14 MED ORDER — BISACODYL 10 MG RE SUPP: 10.0000 mg | Freq: Every day | RECTAL | Status: DC

## 2016-12-14 MED ORDER — METOPROLOL TARTRATE 12.5 MG HALF TABLET
12.5000 mg | ORAL_TABLET | Freq: Two times a day (BID) | ORAL | Status: DC
  Administered 2016-12-17: 12.5 mg via ORAL
  Filled 2016-12-14: qty 1

## 2016-12-14 MED ORDER — FENTANYL CITRATE (PF) 100 MCG/2ML IJ SOLN
INTRAMUSCULAR | Status: DC | PRN
  Administered 2016-12-14: 100 ug via INTRAVENOUS
  Administered 2016-12-14: 200 ug via INTRAVENOUS
  Administered 2016-12-14: 100 ug via INTRAVENOUS
  Administered 2016-12-14: 300 ug via INTRAVENOUS
  Administered 2016-12-14 (×3): 100 ug via INTRAVENOUS

## 2016-12-14 MED ORDER — MIDAZOLAM HCL 5 MG/5ML IJ SOLN
INTRAMUSCULAR | Status: DC | PRN
  Administered 2016-12-14 (×2): 3 mg via INTRAVENOUS
  Administered 2016-12-14 (×2): 2 mg via INTRAVENOUS

## 2016-12-14 MED ORDER — HEPARIN SODIUM (PORCINE) 1000 UNIT/ML IJ SOLN
INTRAMUSCULAR | Status: AC
  Filled 2016-12-14: qty 2

## 2016-12-14 MED ORDER — MILRINONE LACTATE IN DEXTROSE 20-5 MG/100ML-% IV SOLN
0.1250 ug/kg/min | INTRAVENOUS | Status: DC
  Administered 2016-12-15: 0.125 ug/kg/min via INTRAVENOUS
  Administered 2016-12-15: 0.25 ug/kg/min via INTRAVENOUS
  Administered 2016-12-16 – 2016-12-18 (×2): 0.125 ug/kg/min via INTRAVENOUS
  Filled 2016-12-14 (×4): qty 100

## 2016-12-14 MED ORDER — ACETAMINOPHEN 160 MG/5ML PO SOLN
1000.0000 mg | Freq: Four times a day (QID) | ORAL | Status: DC
  Administered 2016-12-15: 1000 mg
  Filled 2016-12-14: qty 40.6

## 2016-12-14 MED ORDER — SODIUM CHLORIDE 0.9 % IV SOLN
30.0000 ug/min | INTRAVENOUS | Status: AC
  Administered 2016-12-14: 10 ug/min via INTRAVENOUS
  Filled 2016-12-14: qty 2

## 2016-12-14 MED ORDER — LIDOCAINE HCL 2 % IJ SOLN
INTRAMUSCULAR | Status: DC | PRN
  Administered 2016-12-14: 2 mL

## 2016-12-14 MED ORDER — SODIUM CHLORIDE 0.9 % IV SOLN
0.0300 [IU]/min | INTRAVENOUS | Status: DC
  Filled 2016-12-14: qty 2

## 2016-12-14 MED ORDER — LACTATED RINGERS IV SOLN
INTRAVENOUS | Status: DC
  Administered 2016-12-14: 20 mL via INTRAVENOUS
  Administered 2016-12-19: 10 mL/h via INTRAVENOUS

## 2016-12-14 MED ORDER — CALCIUM CHLORIDE 10 % IV SOLN
1.0000 g | Freq: Once | INTRAVENOUS | Status: AC
Start: 2016-12-15 — End: 2016-12-14
  Administered 2016-12-14: 1 g via INTRAVENOUS

## 2016-12-14 MED ORDER — VASOPRESSIN 20 UNIT/ML IV SOLN
INTRAVENOUS | Status: DC | PRN
  Administered 2016-12-14: .03 [IU]/min via INTRAVENOUS

## 2016-12-14 MED ORDER — SODIUM CHLORIDE 0.9 % IV SOLN
1.5000 mg/kg/h | INTRAVENOUS | Status: AC
  Administered 2016-12-14: 1.5 mg/kg/h via INTRAVENOUS
  Filled 2016-12-14: qty 25

## 2016-12-14 MED ORDER — METOPROLOL TARTRATE 12.5 MG HALF TABLET: 12.5000 mg | ORAL_TABLET | Freq: Once | ORAL | Status: DC

## 2016-12-14 MED ORDER — HEPARIN (PORCINE) IN NACL 2-0.9 UNIT/ML-% IJ SOLN
INTRAMUSCULAR | Status: AC | PRN
  Administered 2016-12-14: 1000 mL via INTRA_ARTERIAL

## 2016-12-14 MED ORDER — PANTOPRAZOLE SODIUM 40 MG PO TBEC
40.0000 mg | DELAYED_RELEASE_TABLET | Freq: Every day | ORAL | Status: DC
  Administered 2016-12-16 – 2016-12-27 (×12): 40 mg via ORAL
  Filled 2016-12-14 (×12): qty 1

## 2016-12-14 MED ORDER — CHLORHEXIDINE GLUCONATE CLOTH 2 % EX PADS: 6.0000 | MEDICATED_PAD | Freq: Once | CUTANEOUS | Status: DC

## 2016-12-14 MED ORDER — HEPARIN (PORCINE) IN NACL 2-0.9 UNIT/ML-% IJ SOLN
INTRAMUSCULAR | Status: AC
  Filled 2016-12-14: qty 1000

## 2016-12-14 MED ORDER — SODIUM CHLORIDE 0.9 % IV SOLN
INTRAVENOUS | Status: DC
  Administered 2016-12-14: 22:00:00 via INTRAVENOUS

## 2016-12-14 MED ORDER — HEPARIN SODIUM (PORCINE) 1000 UNIT/ML IJ SOLN
INTRAMUSCULAR | Status: DC | PRN
  Administered 2016-12-14: 5000 [IU] via INTRAVENOUS

## 2016-12-14 MED ORDER — NITROGLYCERIN IN D5W 200-5 MCG/ML-% IV SOLN
0.0000 ug/min | INTRAVENOUS | Status: DC
Start: 2016-12-14 — End: 2016-12-16

## 2016-12-14 MED ORDER — OXYCODONE HCL 5 MG PO TABS
5.0000 mg | ORAL_TABLET | ORAL | Status: DC | PRN
  Administered 2016-12-16 – 2016-12-19 (×6): 5 mg via ORAL
  Administered 2016-12-27 (×2): 10 mg via ORAL
  Filled 2016-12-14 (×3): qty 1
  Filled 2016-12-14: qty 2
  Filled 2016-12-14: qty 1
  Filled 2016-12-14: qty 2
  Filled 2016-12-14: qty 1

## 2016-12-14 MED ORDER — TRANEXAMIC ACID (OHS) BOLUS VIA INFUSION
15.0000 mg/kg | INTRAVENOUS | Status: AC
  Administered 2016-12-14: 1263 mg via INTRAVENOUS
  Filled 2016-12-14: qty 1263

## 2016-12-14 MED ORDER — TEMAZEPAM 15 MG PO CAPS: 15.0000 mg | ORAL_CAPSULE | Freq: Once | ORAL | Status: DC | PRN

## 2016-12-14 MED ORDER — OXYCODONE HCL 5 MG PO TABS
5.0000 mg | ORAL_TABLET | Freq: Once | ORAL | Status: DC | PRN
  Filled 2016-12-14: qty 1

## 2016-12-14 MED ORDER — DEXMEDETOMIDINE HCL IN NACL 400 MCG/100ML IV SOLN
0.0000 ug/kg/h | INTRAVENOUS | Status: DC
  Administered 2016-12-14: 0.5 ug/kg/h via INTRAVENOUS

## 2016-12-14 MED ORDER — ATORVASTATIN CALCIUM 40 MG PO TABS: 40.0000 mg | ORAL_TABLET | Freq: Every day | ORAL | Status: DC

## 2016-12-14 MED ORDER — DEXTROSE 5 % IV SOLN
0.0000 ug/min | INTRAVENOUS | Status: DC
  Administered 2016-12-15: 6 ug/min via INTRAVENOUS
  Administered 2016-12-15 (×2): 10 ug/min via INTRAVENOUS
  Administered 2016-12-16: 3 ug/min via INTRAVENOUS
  Filled 2016-12-14 (×5): qty 4

## 2016-12-14 MED ORDER — METOPROLOL TARTRATE 5 MG/5ML IV SOLN: 2.5000 mg | INTRAVENOUS | Status: DC | PRN

## 2016-12-14 MED ORDER — SUCCINYLCHOLINE CHLORIDE 200 MG/10ML IV SOSY
PREFILLED_SYRINGE | INTRAVENOUS | Status: DC | PRN
  Administered 2016-12-14: 100 mg via INTRAVENOUS

## 2016-12-14 MED ORDER — BISACODYL 5 MG PO TBEC
10.0000 mg | DELAYED_RELEASE_TABLET | Freq: Every day | ORAL | Status: DC
  Administered 2016-12-16 – 2016-12-27 (×9): 10 mg via ORAL
  Filled 2016-12-14 (×10): qty 2

## 2016-12-14 MED ORDER — MAGNESIUM SULFATE 50 % IJ SOLN
40.0000 meq | INTRAMUSCULAR | Status: DC
  Filled 2016-12-14: qty 9.85

## 2016-12-14 MED ORDER — BISACODYL 5 MG PO TBEC: 5.0000 mg | DELAYED_RELEASE_TABLET | Freq: Once | ORAL | Status: DC

## 2016-12-14 MED ORDER — DEXTROSE 5 % IV SOLN
1.5000 g | Freq: Two times a day (BID) | INTRAVENOUS | Status: DC
  Administered 2016-12-15: 1.5 g via INTRAVENOUS
  Filled 2016-12-14 (×2): qty 1.5

## 2016-12-14 MED FILL — Fentanyl Citrate Preservative Free (PF) Inj 100 MCG/2ML: INTRAMUSCULAR | Qty: 2 | Status: AC

## 2016-12-14 MED FILL — Heparin Sodium (Porcine) 100 Unt/ML in Sodium Chloride 0.45%: INTRAMUSCULAR | Qty: 250 | Status: AC

## 2016-12-14 SURGICAL SUPPLY — 109 items
ADAPTER CARDIO PERF ANTE/RETRO (ADAPTER) ×4 IMPLANT
APPLICATOR COTTON TIP 6IN STRL (MISCELLANEOUS) ×4 IMPLANT
BAG DECANTER FOR FLEXI CONT (MISCELLANEOUS) ×4 IMPLANT
BANDAGE ACE 4X5 VEL STRL LF (GAUZE/BANDAGES/DRESSINGS) ×4 IMPLANT
BANDAGE ACE 6X5 VEL STRL LF (GAUZE/BANDAGES/DRESSINGS) ×4 IMPLANT
BASKET HEART  (ORDER IN 25'S) (MISCELLANEOUS) ×1
BASKET HEART (ORDER IN 25'S) (MISCELLANEOUS) ×1
BASKET HEART (ORDER IN 25S) (MISCELLANEOUS) ×2 IMPLANT
BLADE CLIPPER SURG (BLADE) IMPLANT
BLADE STERNUM SYSTEM 6 (BLADE) ×4 IMPLANT
BLADE SURG 11 STRL SS (BLADE) ×4 IMPLANT
BLADE SURG 12 STRL SS (BLADE) ×4 IMPLANT
BNDG GAUZE ELAST 4 BULKY (GAUZE/BANDAGES/DRESSINGS) ×4 IMPLANT
CANISTER SUCT 3000ML PPV (MISCELLANEOUS) ×4 IMPLANT
CANNULA GUNDRY RCSP 15FR (MISCELLANEOUS) ×4 IMPLANT
CATH CPB KIT VANTRIGT (MISCELLANEOUS) ×4 IMPLANT
CATH ROBINSON RED A/P 18FR (CATHETERS) ×12 IMPLANT
CATH THORACIC 36FR RT ANG (CATHETERS) ×4 IMPLANT
CLIP FOGARTY SPRING 6M (CLIP) ×4 IMPLANT
CLIP RETRACTION 3.0MM CORONARY (MISCELLANEOUS) ×4 IMPLANT
CLIP VESOCCLUDE SM WIDE 24/CT (CLIP) ×12 IMPLANT
CRADLE DONUT ADULT HEAD (MISCELLANEOUS) ×4 IMPLANT
DERMABOND ADVANCED (GAUZE/BANDAGES/DRESSINGS) ×2
DERMABOND ADVANCED .7 DNX12 (GAUZE/BANDAGES/DRESSINGS) ×2 IMPLANT
DRAIN CHANNEL 32F RND 10.7 FF (WOUND CARE) ×4 IMPLANT
DRAPE CARDIOVASCULAR INCISE (DRAPES) ×2
DRAPE SLUSH/WARMER DISC (DRAPES) ×4 IMPLANT
DRAPE SRG 135X102X78XABS (DRAPES) ×2 IMPLANT
DRSG AQUACEL AG ADV 3.5X14 (GAUZE/BANDAGES/DRESSINGS) ×4 IMPLANT
ELECT BLADE 4.0 EZ CLEAN MEGAD (MISCELLANEOUS) ×4
ELECT BLADE 6.5 EXT (BLADE) ×4 IMPLANT
ELECT CAUTERY BLADE 6.4 (BLADE) ×4 IMPLANT
ELECT REM PT RETURN 9FT ADLT (ELECTROSURGICAL) ×8
ELECTRODE BLDE 4.0 EZ CLN MEGD (MISCELLANEOUS) ×2 IMPLANT
ELECTRODE REM PT RTRN 9FT ADLT (ELECTROSURGICAL) ×4 IMPLANT
FELT TEFLON 1X6 (MISCELLANEOUS) ×4 IMPLANT
GAUZE SPONGE 4X4 12PLY STRL (GAUZE/BANDAGES/DRESSINGS) ×12 IMPLANT
GLOVE BIO SURGEON STRL SZ 6 (GLOVE) ×8 IMPLANT
GLOVE BIO SURGEON STRL SZ 6.5 (GLOVE) ×18 IMPLANT
GLOVE BIO SURGEON STRL SZ7.5 (GLOVE) ×12 IMPLANT
GLOVE BIO SURGEONS STRL SZ 6.5 (GLOVE) ×6
GLOVE BIOGEL M 6.5 STRL (GLOVE) ×16 IMPLANT
GLOVE BIOGEL PI IND STRL 6.5 (GLOVE) ×8 IMPLANT
GLOVE BIOGEL PI INDICATOR 6.5 (GLOVE) ×8
GOWN STRL REUS W/ TWL LRG LVL3 (GOWN DISPOSABLE) ×20 IMPLANT
GOWN STRL REUS W/TWL LRG LVL3 (GOWN DISPOSABLE) ×20
HEMOSTAT POWDER SURGIFOAM 1G (HEMOSTASIS) ×12 IMPLANT
HEMOSTAT SURGICEL 2X14 (HEMOSTASIS) ×4 IMPLANT
INSERT FOGARTY XLG (MISCELLANEOUS) IMPLANT
KIT BASIN OR (CUSTOM PROCEDURE TRAY) ×4 IMPLANT
KIT ROOM TURNOVER OR (KITS) ×4 IMPLANT
KIT SUCTION CATH 14FR (SUCTIONS) ×4 IMPLANT
KIT VASOVIEW HEMOPRO VH 3000 (KITS) ×4 IMPLANT
LEAD PACING MYOCARDI (MISCELLANEOUS) ×4 IMPLANT
MARKER GRAFT CORONARY BYPASS (MISCELLANEOUS) ×12 IMPLANT
NS IRRIG 1000ML POUR BTL (IV SOLUTION) ×24 IMPLANT
PACK OPEN HEART (CUSTOM PROCEDURE TRAY) ×4 IMPLANT
PAD ARMBOARD 7.5X6 YLW CONV (MISCELLANEOUS) ×8 IMPLANT
PAD ELECT DEFIB RADIOL ZOLL (MISCELLANEOUS) ×4 IMPLANT
PENCIL BUTTON HOLSTER BLD 10FT (ELECTRODE) ×4 IMPLANT
PUNCH AORTIC ROT 4.0MM RCL 40 (MISCELLANEOUS) ×4 IMPLANT
PUNCH AORTIC ROTATE 4.0MM (MISCELLANEOUS) IMPLANT
PUNCH AORTIC ROTATE 4.5MM 8IN (MISCELLANEOUS) IMPLANT
PUNCH AORTIC ROTATE 5MM 8IN (MISCELLANEOUS) IMPLANT
SET CARDIOPLEGIA MPS 5001102 (MISCELLANEOUS) ×4 IMPLANT
SOLUTION ANTI FOG 6CC (MISCELLANEOUS) ×4 IMPLANT
SPOGE SURGIFLO 8M (HEMOSTASIS) ×2
SPONGE LAP 18X18 X RAY DECT (DISPOSABLE) ×12 IMPLANT
SPONGE LAP 4X18 X RAY DECT (DISPOSABLE) ×8 IMPLANT
SPONGE SURGIFLO 8M (HEMOSTASIS) ×2 IMPLANT
SURGIFLO W/THROMBIN 8M KIT (HEMOSTASIS) ×4 IMPLANT
SUT BONE WAX W31G (SUTURE) ×4 IMPLANT
SUT MNCRL AB 4-0 PS2 18 (SUTURE) ×12 IMPLANT
SUT PROLENE 3 0 SH DA (SUTURE) ×12 IMPLANT
SUT PROLENE 3 0 SH1 36 (SUTURE) IMPLANT
SUT PROLENE 4 0 RB 1 (SUTURE) ×8
SUT PROLENE 4 0 SH DA (SUTURE) ×12 IMPLANT
SUT PROLENE 4-0 RB1 .5 CRCL 36 (SUTURE) ×8 IMPLANT
SUT PROLENE 5 0 C 1 36 (SUTURE) IMPLANT
SUT PROLENE 6 0 C 1 30 (SUTURE) ×16 IMPLANT
SUT PROLENE 6 0 CC (SUTURE) ×24 IMPLANT
SUT PROLENE 8 0 BV175 6 (SUTURE) ×4 IMPLANT
SUT PROLENE BLUE 7 0 (SUTURE) ×8 IMPLANT
SUT SILK  1 MH (SUTURE)
SUT SILK 1 MH (SUTURE) IMPLANT
SUT SILK 2 0 SH CR/8 (SUTURE) ×4 IMPLANT
SUT SILK 3 0 SH CR/8 (SUTURE) IMPLANT
SUT STEEL 6MS V (SUTURE) ×8 IMPLANT
SUT STEEL SZ 6 DBL 3X14 BALL (SUTURE) ×4 IMPLANT
SUT VIC AB 1 CTX 36 (SUTURE) ×4
SUT VIC AB 1 CTX36XBRD ANBCTR (SUTURE) ×4 IMPLANT
SUT VIC AB 2-0 CT1 27 (SUTURE) ×4
SUT VIC AB 2-0 CT1 TAPERPNT 27 (SUTURE) ×4 IMPLANT
SUT VIC AB 2-0 CTX 27 (SUTURE) IMPLANT
SUT VIC AB 3-0 X1 27 (SUTURE) IMPLANT
SUTURE E-PAK OPEN HEART (SUTURE) ×4 IMPLANT
SYSTEM SAHARA CHEST DRAIN ATS (WOUND CARE) ×4 IMPLANT
TAPE CLOTH SURG 4X10 WHT LF (GAUZE/BANDAGES/DRESSINGS) ×8 IMPLANT
TAPE PAPER 2X10 WHT MICROPORE (GAUZE/BANDAGES/DRESSINGS) ×4 IMPLANT
TOWEL GREEN STERILE (TOWEL DISPOSABLE) ×4 IMPLANT
TOWEL GREEN STERILE FF (TOWEL DISPOSABLE) IMPLANT
TOWEL OR 17X24 6PK STRL BLUE (TOWEL DISPOSABLE) IMPLANT
TOWEL OR 17X26 10 PK STRL BLUE (TOWEL DISPOSABLE) IMPLANT
TRAY CATH LUMEN 1 20CM STRL (SET/KITS/TRAYS/PACK) ×4 IMPLANT
TRAY FOLEY SILVER 16FR TEMP (SET/KITS/TRAYS/PACK) ×4 IMPLANT
TUBING ART PRESS 48 MALE/FEM (TUBING) ×8 IMPLANT
TUBING INSUFFLATION (TUBING) ×4 IMPLANT
UNDERPAD 30X30 (UNDERPADS AND DIAPERS) ×4 IMPLANT
WATER STERILE IRR 1000ML POUR (IV SOLUTION) ×8 IMPLANT

## 2016-12-14 SURGICAL SUPPLY — 9 items
CATH 5FR JL3.5 JR4 ANG PIG MP (CATHETERS) ×2 IMPLANT
DEVICE RAD COMP TR BAND LRG (VASCULAR PRODUCTS) ×2 IMPLANT
GLIDESHEATH SLEND SS 6F .021 (SHEATH) ×2 IMPLANT
GUIDEWIRE INQWIRE 1.5J.035X260 (WIRE) ×1 IMPLANT
INQWIRE 1.5J .035X260CM (WIRE) ×2
KIT HEART LEFT (KITS) ×2 IMPLANT
PACK CARDIAC CATHETERIZATION (CUSTOM PROCEDURE TRAY) ×2 IMPLANT
TRANSDUCER W/STOPCOCK (MISCELLANEOUS) ×2 IMPLANT
TUBING CIL FLEX 10 FLL-RA (TUBING) ×2 IMPLANT

## 2016-12-14 NOTE — Progress Notes (Signed)
Nurse shared patient is preparing to go to Cath Lab.  Had prayer with patient and family. She seems in good spirits waiting for this. Had husband, brother and son with her. Conard Novak, Chaplain    12/14/16 1200  Clinical Encounter Type  Visited With Patient and family together  Visit Type Initial;Spiritual support;Pre-op  Referral From Nurse  Spiritual Encounters  Spiritual Needs Prayer;Emotional  Stress Factors  Patient Stress Factors None identified  Family Stress Factors None identified

## 2016-12-14 NOTE — Progress Notes (Signed)
Echocardiogram Transesophageal has been performed.  Matilde Bash 12/14/2016, 3:45 PM

## 2016-12-14 NOTE — OR Nursing (Signed)
19:55 - 45 minute call to SICU charge nurse 20:25 - 20 minute call to SICU charge nurse

## 2016-12-14 NOTE — H&P (View-Only) (Signed)
Progress Note  Patient Name: Emily Rocha Date of Encounter: 12/14/2016  Primary Cardiologist:   Dr. Harl Bowie  Subjective   No chest pain.  She is still SOB and was SOB standing at the bedside.    Inpatient Medications    Scheduled Meds: . [START ON 12/15/2016] aspirin EC  81 mg Oral QHS  . atorvastatin  10 mg Oral Daily  . cholecalciferol  2,000 Units Oral Daily  . ipratropium  0.5 mg Nebulization TID  . levalbuterol  1.25 mg Nebulization TID  . metoprolol tartrate  25 mg Oral BID  . pantoprazole  40 mg Oral Daily  . sodium chloride flush  3 mL Intravenous Q12H   Continuous Infusions: . sodium chloride 10 mL/hr at 12/13/16 0830  . sodium chloride    . sodium chloride 1 mL/kg/hr (12/14/16 0700)  . azithromycin Stopped (12/13/16 2321)  . cefTRIAXone (ROCEPHIN)  IV Stopped (12/13/16 2156)  . heparin 1,000 Units/hr (12/13/16 1300)   PRN Meds: sodium chloride, acetaminophen, ALPRAZolam, levalbuterol, nitroGLYCERIN, ondansetron **OR** ondansetron (ZOFRAN) IV, sodium chloride flush   Vital Signs    Vitals:   12/14/16 0550 12/14/16 0612 12/14/16 0724 12/14/16 0726  BP: 93/80   94/77  Pulse:    (!) 102  Resp: (!) 29   (!) 41  Temp: (!) 97.1 F (36.2 C)     TempSrc: Oral     SpO2: 90% 92% 93% 95%  Weight:      Height:        Intake/Output Summary (Last 24 hours) at 12/14/16 0734 Last data filed at 12/14/16 0700  Gross per 24 hour  Intake          1783.14 ml  Output             1250 ml  Net           533.14 ml   Filed Weights   12/12/16 1753 12/13/16 0649  Weight: 180 lb (81.6 kg) 185 lb 10 oz (84.2 kg)    Telemetry    NSR - Personally Reviewed  ECG    NA - Personally Reviewed  Physical Exam   GEN: No acute distress.   Neck: No  JVD Cardiac: RRR, no murmurs, rubs, or gallops.  Respiratory:   Decreased breath sounds with few wheezes.   GI: Soft, nontender, non-distended  MS: No  edema; No deformity. Neuro:  Nonfocal  Psych: Normal affect   Labs      Chemistry Recent Labs Lab 12/12/16 1857 12/12/16 2011 12/13/16 1023 12/14/16 0452  NA 138 137 135 135  K 3.7 3.8 4.1 4.1  CL 100* 100* 98* 96*  CO2 24  --  26 28  GLUCOSE 115* 118* 150* 109*  BUN 11 10 9 16   CREATININE 0.58 0.50 0.64 0.83  CALCIUM 9.3  --  8.7* 9.0  PROT 7.7  --   --   --   ALBUMIN 4.1  --   --   --   AST 53*  --   --   --   ALT 110*  --   --   --   ALKPHOS 101  --   --   --   BILITOT 1.1  --   --   --   GFRNONAA >60  --  >60 >60  GFRAA >60  --  >60 >60  ANIONGAP 14  --  11 11     Hematology Recent Labs Lab 12/12/16 1803 12/12/16 2011 12/13/16 0554 12/14/16  0452  WBC 9.5  --  6.3 8.8  RBC 4.67  --  4.52 4.44  HGB 13.6 15.0 12.9 12.4  HCT 42.1 44.0 40.3 39.7  MCV 90.1  --  89.2 89.4  MCH 29.1  --  28.5 27.9  MCHC 32.3  --  32.0 31.2  RDW 14.7  --  14.6 14.4  PLT 233  --  213 256    Cardiac Enzymes Recent Labs Lab 12/12/16 1856 12/12/16 2301 12/13/16 0554  TROPONINI 2.20* 2.15* 2.02*    Recent Labs Lab 12/12/16 1812  TROPIPOC 1.60*     BNP Recent Labs Lab 12/12/16 1857  BNP 458.0*     DDimer No results for input(s): DDIMER in the last 168 hours.   Radiology    Ct Angio Chest Pe W And/or Wo Contrast  Result Date: 12/12/2016 CLINICAL DATA:  Increase shortness of breath and chest pain EXAM: CT ANGIOGRAPHY CHEST WITH CONTRAST TECHNIQUE: Multidetector CT imaging of the chest was performed using the standard protocol during bolus administration of intravenous contrast. Multiplanar CT image reconstructions and MIPs were obtained to evaluate the vascular anatomy. CONTRAST:  100 mL Isovue 370 intravenous COMPARISON:  Radiograph 12/12/2016 FINDINGS: Cardiovascular: Respiratory motion artifact limits evaluation for pulmonary emboli particularly in the lower lobes. Allowing for this, no definite acute filling defects are seen within the central or segmental pulmonary vessels to suggest acute embolus. Aortic atherosclerosis. No aneurysmal  dilatation. Coronary artery calcification. Borderline to mild cardiomegaly. No pericardial effusion. Reflux of contrast into the hepatic veins consistent with elevated right heart pressure Mediastinum/Nodes: Midline trachea. No thyroid mass. Nonspecific subcentimeter mediastinal lymph nodes. Moderate distal hiatal hernia Lungs/Pleura: Moderate emphysema. Small right greater than left pleural effusions. Ground-glass densities within the right middle lobe, and right greater than left lower lobes. Partial consolidations within both lower lobes. Upper Abdomen: Surgical clips in the right upper quadrant. Fat containing ventral hernia. Musculoskeletal: Degenerative changes. No acute or suspicious bone lesion Review of the MIP images confirms the above findings. IMPRESSION: 1. Respiratory motion artifact limits assessment for pulmonary emboli in the lower lobes. Allowing for motion, no acute pulmonary embolus is seen 2. Mild cardiomegaly. Small right greater than left pleural effusions with multifocal right greater than left ground-glass opacity which may reflect atypical distribution of edema or multifocal pneumonia. 3. Moderate emphysema Aortic Atherosclerosis (ICD10-I70.0) and Emphysema (ICD10-J43.9). Electronically Signed   By: Donavan Foil M.D.   On: 12/12/2016 21:07   Dg Chest Port 1 View  Result Date: 12/12/2016 CLINICAL DATA:  61 year old female with dyspnea. EXAM: PORTABLE CHEST 1 VIEW COMPARISON:  Chest radiograph dated 11/05/2016 FINDINGS: There is emphysematous changes of the lungs. There is diffuse interstitial prominence, progressed from prior radiograph and likely represent a degree of edema on a background of emphysema. Superimposed pneumonia is not excluded. Clinical correlation is recommended. There is no pleural effusion or pneumothorax. There is stable mild cardiomegaly. No acute osseous pathology. IMPRESSION: 1. Mild cardiomegaly with findings of mild CHF. Superimposed pneumonia is not excluded.  Clinical correlation is recommended. 2. Emphysema. Electronically Signed   By: Anner Crete M.D.   On: 12/12/2016 18:49    Cardiac Studies   ECHO:  12/13/16     - Left ventricle: Septal and inferior basal hypokinesis. Systolic   function was mildly reduced. The estimated ejection fraction was   in the range of 45% to 50%. Doppler parameters are consistent   with both elevated ventricular end-diastolic filling pressure and   elevated left atrial filling  pressure. - Aortic valve: There was mild stenosis. Valve area (VTI): 1.56   cm^2. Valve area (Vmax): 1.54 cm^2. Valve area (Vmean): 1.51   cm^2. - Mitral valve: There was mild to moderate regurgitation. Valve   area by continuity equation (using LVOT flow): 2.73 cm^2. - Left atrium: The atrium was mildly dilated. - Atrial septum: No defect or patent foramen ovale was identified. - Pulmonary arteries: PA peak pressure: 47 mm Hg (S).   Patient Profile     61 y.o. female with a hx of carotid artery stenosis s/p right CEA (12/06/14), former smoker, COPD, GERD, HLD, and PAD who is being seen for the evaluation of elevated troponin at the request of Dr. Thereasa Solo.  Assessment & Plan    NSTEMI:  Cath today.  On ASA, Heparin, beta blocker.    COPD:  Continues with breathing treatments.    DYSLIPIDEMIA:  LDL not at target.  I will increase to moderate dose statin at least.  CARDIOMYOPATHY:  Reduced EF likely ischemic.  Cath today.  Titrate meds post procedure although BP is low and will reduce our ability to increase meds.   Signed, Minus Breeding, MD  12/14/2016, 7:34 AM  '

## 2016-12-14 NOTE — Progress Notes (Signed)
Progress Note  Patient Name: Emily Rocha Date of Encounter: 12/14/2016  Primary Cardiologist:   Dr. Harl Bowie  Subjective   No chest pain.  She is still SOB and was SOB standing at the bedside.    Inpatient Medications    Scheduled Meds: . [START ON 12/15/2016] aspirin EC  81 mg Oral QHS  . atorvastatin  10 mg Oral Daily  . cholecalciferol  2,000 Units Oral Daily  . ipratropium  0.5 mg Nebulization TID  . levalbuterol  1.25 mg Nebulization TID  . metoprolol tartrate  25 mg Oral BID  . pantoprazole  40 mg Oral Daily  . sodium chloride flush  3 mL Intravenous Q12H   Continuous Infusions: . sodium chloride 10 mL/hr at 12/13/16 0830  . sodium chloride    . sodium chloride 1 mL/kg/hr (12/14/16 0700)  . azithromycin Stopped (12/13/16 2321)  . cefTRIAXone (ROCEPHIN)  IV Stopped (12/13/16 2156)  . heparin 1,000 Units/hr (12/13/16 1300)   PRN Meds: sodium chloride, acetaminophen, ALPRAZolam, levalbuterol, nitroGLYCERIN, ondansetron **OR** ondansetron (ZOFRAN) IV, sodium chloride flush   Vital Signs    Vitals:   12/14/16 0550 12/14/16 0612 12/14/16 0724 12/14/16 0726  BP: 93/80   94/77  Pulse:    (!) 102  Resp: (!) 29   (!) 41  Temp: (!) 97.1 F (36.2 C)     TempSrc: Oral     SpO2: 90% 92% 93% 95%  Weight:      Height:        Intake/Output Summary (Last 24 hours) at 12/14/16 0734 Last data filed at 12/14/16 0700  Gross per 24 hour  Intake          1783.14 ml  Output             1250 ml  Net           533.14 ml   Filed Weights   12/12/16 1753 12/13/16 0649  Weight: 180 lb (81.6 kg) 185 lb 10 oz (84.2 kg)    Telemetry    NSR - Personally Reviewed  ECG    NA - Personally Reviewed  Physical Exam   GEN: No acute distress.   Neck: No  JVD Cardiac: RRR, no murmurs, rubs, or gallops.  Respiratory:   Decreased breath sounds with few wheezes.   GI: Soft, nontender, non-distended  MS: No  edema; No deformity. Neuro:  Nonfocal  Psych: Normal affect   Labs      Chemistry Recent Labs Lab 12/12/16 1857 12/12/16 2011 12/13/16 1023 12/14/16 0452  NA 138 137 135 135  K 3.7 3.8 4.1 4.1  CL 100* 100* 98* 96*  CO2 24  --  26 28  GLUCOSE 115* 118* 150* 109*  BUN 11 10 9 16   CREATININE 0.58 0.50 0.64 0.83  CALCIUM 9.3  --  8.7* 9.0  PROT 7.7  --   --   --   ALBUMIN 4.1  --   --   --   AST 53*  --   --   --   ALT 110*  --   --   --   ALKPHOS 101  --   --   --   BILITOT 1.1  --   --   --   GFRNONAA >60  --  >60 >60  GFRAA >60  --  >60 >60  ANIONGAP 14  --  11 11     Hematology Recent Labs Lab 12/12/16 1803 12/12/16 2011 12/13/16 0554 12/14/16  0452  WBC 9.5  --  6.3 8.8  RBC 4.67  --  4.52 4.44  HGB 13.6 15.0 12.9 12.4  HCT 42.1 44.0 40.3 39.7  MCV 90.1  --  89.2 89.4  MCH 29.1  --  28.5 27.9  MCHC 32.3  --  32.0 31.2  RDW 14.7  --  14.6 14.4  PLT 233  --  213 256    Cardiac Enzymes Recent Labs Lab 12/12/16 1856 12/12/16 2301 12/13/16 0554  TROPONINI 2.20* 2.15* 2.02*    Recent Labs Lab 12/12/16 1812  TROPIPOC 1.60*     BNP Recent Labs Lab 12/12/16 1857  BNP 458.0*     DDimer No results for input(s): DDIMER in the last 168 hours.   Radiology    Ct Angio Chest Pe W And/or Wo Contrast  Result Date: 12/12/2016 CLINICAL DATA:  Increase shortness of breath and chest pain EXAM: CT ANGIOGRAPHY CHEST WITH CONTRAST TECHNIQUE: Multidetector CT imaging of the chest was performed using the standard protocol during bolus administration of intravenous contrast. Multiplanar CT image reconstructions and MIPs were obtained to evaluate the vascular anatomy. CONTRAST:  100 mL Isovue 370 intravenous COMPARISON:  Radiograph 12/12/2016 FINDINGS: Cardiovascular: Respiratory motion artifact limits evaluation for pulmonary emboli particularly in the lower lobes. Allowing for this, no definite acute filling defects are seen within the central or segmental pulmonary vessels to suggest acute embolus. Aortic atherosclerosis. No aneurysmal  dilatation. Coronary artery calcification. Borderline to mild cardiomegaly. No pericardial effusion. Reflux of contrast into the hepatic veins consistent with elevated right heart pressure Mediastinum/Nodes: Midline trachea. No thyroid mass. Nonspecific subcentimeter mediastinal lymph nodes. Moderate distal hiatal hernia Lungs/Pleura: Moderate emphysema. Small right greater than left pleural effusions. Ground-glass densities within the right middle lobe, and right greater than left lower lobes. Partial consolidations within both lower lobes. Upper Abdomen: Surgical clips in the right upper quadrant. Fat containing ventral hernia. Musculoskeletal: Degenerative changes. No acute or suspicious bone lesion Review of the MIP images confirms the above findings. IMPRESSION: 1. Respiratory motion artifact limits assessment for pulmonary emboli in the lower lobes. Allowing for motion, no acute pulmonary embolus is seen 2. Mild cardiomegaly. Small right greater than left pleural effusions with multifocal right greater than left ground-glass opacity which may reflect atypical distribution of edema or multifocal pneumonia. 3. Moderate emphysema Aortic Atherosclerosis (ICD10-I70.0) and Emphysema (ICD10-J43.9). Electronically Signed   By: Donavan Foil M.D.   On: 12/12/2016 21:07   Dg Chest Port 1 View  Result Date: 12/12/2016 CLINICAL DATA:  61 year old female with dyspnea. EXAM: PORTABLE CHEST 1 VIEW COMPARISON:  Chest radiograph dated 11/05/2016 FINDINGS: There is emphysematous changes of the lungs. There is diffuse interstitial prominence, progressed from prior radiograph and likely represent a degree of edema on a background of emphysema. Superimposed pneumonia is not excluded. Clinical correlation is recommended. There is no pleural effusion or pneumothorax. There is stable mild cardiomegaly. No acute osseous pathology. IMPRESSION: 1. Mild cardiomegaly with findings of mild CHF. Superimposed pneumonia is not excluded.  Clinical correlation is recommended. 2. Emphysema. Electronically Signed   By: Anner Crete M.D.   On: 12/12/2016 18:49    Cardiac Studies   ECHO:  12/13/16     - Left ventricle: Septal and inferior basal hypokinesis. Systolic   function was mildly reduced. The estimated ejection fraction was   in the range of 45% to 50%. Doppler parameters are consistent   with both elevated ventricular end-diastolic filling pressure and   elevated left atrial filling  pressure. - Aortic valve: There was mild stenosis. Valve area (VTI): 1.56   cm^2. Valve area (Vmax): 1.54 cm^2. Valve area (Vmean): 1.51   cm^2. - Mitral valve: There was mild to moderate regurgitation. Valve   area by continuity equation (using LVOT flow): 2.73 cm^2. - Left atrium: The atrium was mildly dilated. - Atrial septum: No defect or patent foramen ovale was identified. - Pulmonary arteries: PA peak pressure: 47 mm Hg (S).   Patient Profile     61 y.o. female with a hx of carotid artery stenosis s/p right CEA (12/06/14), former smoker, COPD, GERD, HLD, and PAD who is being seen for the evaluation of elevated troponin at the request of Dr. Thereasa Solo.  Assessment & Plan    NSTEMI:  Cath today.  On ASA, Heparin, beta blocker.    COPD:  Continues with breathing treatments.    DYSLIPIDEMIA:  LDL not at target.  I will increase to moderate dose statin at least.  CARDIOMYOPATHY:  Reduced EF likely ischemic.  Cath today.  Titrate meds post procedure although BP is low and will reduce our ability to increase meds.   Signed, Minus Breeding, MD  12/14/2016, 7:34 AM  '

## 2016-12-14 NOTE — Anesthesia Procedure Notes (Signed)
Procedure Name: Intubation Date/Time: 12/14/2016 2:54 PM Performed by: Garrison Columbus T Pre-anesthesia Checklist: Patient identified, Emergency Drugs available, Suction available and Patient being monitored Patient Re-evaluated:Patient Re-evaluated prior to induction Oxygen Delivery Method: Circle System Utilized Preoxygenation: Pre-oxygenation with 100% oxygen Induction Type: IV induction, Rapid sequence and Cricoid Pressure applied Laryngoscope Size: Miller and 2 Grade View: Grade I Tube type: Oral Tube size: 8.0 mm Number of attempts: 1 Airway Equipment and Method: Stylet and Oral airway Placement Confirmation: ETT inserted through vocal cords under direct vision,  positive ETCO2 and breath sounds checked- equal and bilateral Secured at: 22 cm Tube secured with: Tape Dental Injury: Teeth and Oropharynx as per pre-operative assessment

## 2016-12-14 NOTE — Transfer of Care (Signed)
Immediate Anesthesia Transfer of Care Note  [name]  Procedure(s) Performed: CORONARY ARTERY BYPASS GRAFTING (CABG) x three , using left internal mammary artery and right leg greater saphenous vein harvested endoscopically (N/A Chest) TRANSESOPHAGEAL ECHOCARDIOGRAM (TEE) (N/A )  Patient Location: SICU  Anesthesia Type:General  Level of Consciousness: Patient remains intubated per anesthesia plan  Airway & Oxygen Therapy: Patient placed on Ventilator (see vital sign flow sheet for setting)  Post-op Assessment: Report given to RN and Post -op Vital signs reviewed and stable  Post vital signs: Reviewed and stable  Last Vitals:  Vitals:   12/14/16 1415 12/14/16 1420  BP: (!) 111/59 (!) 75/47  Pulse: (!) 112 (!) 112  Resp: (!) 35 (!) 29  Temp:    SpO2: 92% 91%    Last Pain:  Vitals:   12/14/16 1348  TempSrc:   PainSc: 4       Patients Stated Pain Goal: 0 (91/79/15 0569)  Complications: No apparent anesthesia complications

## 2016-12-14 NOTE — Brief Op Note (Signed)
12/12/2016 - 12/14/2016  6:50 PM  PATIENT:  Emily Rocha  61 y.o. female  PRE-OPERATIVE DIAGNOSIS:  LM CAD, CARDIOGENIC SHOCK  POST-OPERATIVE DIAGNOSIS:  CAD, CARDiOGENIC SHOCK  PROCEDURE:  Procedure(s): Emergency CORONARY ARTERY BYPASS GRAFTING x 3 -LIMA to LAD -SVG to OM -SVG to DIAGONAL  ENDOSCOPIC HARVEST GREATER SAPHENOUS VEIN -Right Leg -Left Leg Explored  TRANSESOPHAGEAL ECHOCARDIOGRAM (TEE) (N/A)  SURGEON:  Surgeon(s) and Role:    Ivin Poot, MD - Primary  PHYSICIAN ASSISTANT: Ellwood Handler PA-C  ANESTHESIA:   general  EBL:  300 cc  BLOOD ADMINISTERED:1U PRBC,  CELLSAVER, 2 FFP and 1 PLTS  DRAINS: Left and Right Pleural Chest Tubes, Mediastinal Chest Drains   LOCAL MEDICATIONS USED:  NONE  SPECIMEN:  No Specimen  DISPOSITION OF SPECIMEN:  N/A  COUNTS:  YES  TOURNIQUET:  * No tourniquets in log *  DICTATION: .Dragon Dictation  PLAN OF CARE: Admit to inpatient   PATIENT DISPOSITION:  ICU - intubated and hemodynamically stable.   Delay start of Pharmacological VTE agent (>24hrs) due to surgical blood loss or risk of bleeding: yes

## 2016-12-14 NOTE — Anesthesia Procedure Notes (Signed)
Central Venous Catheter Insertion Performed by: Oleta Mouse, anesthesiologist Start/End10/30/2018 3:02 PM, 12/14/2016 3:12 PM Patient location: Pre-op. Preanesthetic checklist: patient identified, IV checked, site marked, risks and benefits discussed, surgical consent, monitors and equipment checked, pre-op evaluation, timeout performed and anesthesia consent Position: supine Lidocaine 1% used for infiltration and patient sedated Hand hygiene performed  and maximum sterile barriers used  Catheter size: 9 Fr Total catheter length 10. MAC introducer Procedure performed using ultrasound guided technique. Ultrasound Notes:anatomy identified, needle tip was noted to be adjacent to the nerve/plexus identified, no ultrasound evidence of intravascular and/or intraneural injection and image(s) printed for medical record Attempts: 1 Following insertion, line sutured, dressing applied and Biopatch. Post procedure assessment: blood return through all ports, free fluid flow and no air  Patient tolerated the procedure well with no immediate complications.

## 2016-12-14 NOTE — Progress Notes (Signed)
ANTICOAGULATION CONSULT NOTE - Follow Up Consult  Pharmacy Consult for Heparin Indication: chest pain/ACS  No Known Allergies  Patient Measurements: Height: 5\' 4"  (162.6 cm) Weight: 185 lb 10 oz (84.2 kg) IBW/kg (Calculated) : 54.7 Heparin Dosing Weight:  73.1 kg  Vital Signs: Temp: 97.1 F (36.2 C) (10/30 0550) Temp Source: Oral (10/30 0550) BP: 94/77 (10/30 0726) Pulse Rate: 102 (10/30 0726)  Labs:  Recent Labs  12/12/16 1803 12/12/16 1854 12/12/16 1856  12/12/16 2011 12/12/16 2301  12/13/16 0554 12/13/16 1023 12/13/16 1653 12/14/16 0452  HGB 13.6  --   --   --  15.0  --   --  12.9  --   --  12.4  HCT 42.1  --   --   --  44.0  --   --  40.3  --   --  39.7  PLT 233  --   --   --   --   --   --  213  --   --  256  LABPROT  --  13.2  --   --   --   --   --   --   --   --   --   INR  --  1.01  --   --   --   --   --   --   --   --   --   HEPARINUNFRC  --   --   --   --   --   --   < > 0.93* 0.52 0.52 0.45  CREATININE  --   --   --   < > 0.50  --   --   --  0.64  --  0.83  TROPONINI  --   --  2.20*  --   --  2.15*  --  2.02*  --   --   --   < > = values in this interval not displayed.  Estimated Creatinine Clearance: 72.8 mL/min (by C-G formula based on SCr of 0.83 mg/dL).   Assessment: 61 yo presenting with ACS - LHC today  Anticoag: none pta - iv hep for r/o ACS HL therapeutic x 3  H&H Stable  Goal of Therapy:  Heparin level 0.3-0.7 units/ml Monitor platelets by anticoagulation protocol: Yes   Plan:  Continue Heparin at 1000 units/hr Daily HL CBC F/U cards plans - cath today  Levester Fresh, PharmD, BCPS, BCCCP Clinical Pharmacist Clinical phone for 12/14/2016 from 7a-3:30p: [phone] If after 3:30p, please call main pharmacy at: [phone] 12/14/2016 8:31 AM

## 2016-12-14 NOTE — Research (Signed)
OPTIMIZE Informed Consent   Subject Name: Emily Rocha  Subject met inclusion and exclusion criteria.  The informed consent form, study requirements and expectations were reviewed with the subject and questions and concerns were addressed prior to the signing of the consent form.  The subject verbalized understanding of the trail requirements.  The subject agreed to participate in the OPTIMIZE trial and signed the informed consent.  The informed consent was obtained prior to performance of any protocol-specific procedures for the subject.  A copy of the signed informed consent was given to the subject and a copy was placed in the subject's medical record. Only applicable if randomized.   Philemon Kingdom D 12/14/2016, 1230 am

## 2016-12-14 NOTE — Anesthesia Preprocedure Evaluation (Addendum)
Anesthesia Evaluation  Patient identified by MRN, date of birth, ID band Patient awake    Reviewed: Allergy & Precautions, NPO status , Patient's Chart, lab work & pertinent test resultsPreop documentation limited or incomplete due to emergent nature of procedure.  Airway Mallampati: II  TM Distance: >3 FB Neck ROM: Full    Dental  (+) Edentulous Upper, Edentulous Lower, Dental Advisory Given   Pulmonary shortness of breath, asthma , COPD, former smoker,    breath sounds clear to auscultation       Cardiovascular + angina + Past MI and + Peripheral Vascular Disease   Rhythm:Regular     Neuro/Psych    GI/Hepatic GERD  ,  Endo/Other    Renal/GU      Musculoskeletal   Abdominal   Peds  Hematology   Anesthesia Other Findings   Reproductive/Obstetrics                            Anesthesia Physical Anesthesia Plan  ASA: IV  Anesthesia Plan: General   Post-op Pain Management:    Induction: Intravenous  PONV Risk Score and Plan: 3 and Ondansetron  Airway Management Planned: Oral ETT  Additional Equipment: Arterial line  Intra-op Plan:   Post-operative Plan: Extubation in OR  Informed Consent: I have reviewed the patients History and Physical, chart, labs and discussed the procedure including the risks, benefits and alternatives for the proposed anesthesia with the patient or authorized representative who has indicated his/her understanding and acceptance.   Dental advisory given  Plan Discussed with: CRNA and Surgeon  Anesthesia Plan Comments:        Anesthesia Quick Evaluation

## 2016-12-14 NOTE — Anesthesia Postprocedure Evaluation (Signed)
Anesthesia Post Note  [name]  Procedure(s) Performed: CORONARY ARTERY BYPASS GRAFTING (CABG) x three , using left internal mammary artery and right leg greater saphenous vein harvested endoscopically (N/A Chest) TRANSESOPHAGEAL ECHOCARDIOGRAM (TEE) (N/A )     Patient location during evaluation: SICU Anesthesia Type: General Level of consciousness: sedated Pain management: pain level controlled Vital Signs Assessment: post-procedure vital signs reviewed and stable Respiratory status: patient remains intubated per anesthesia plan Cardiovascular status: stable Postop Assessment: no apparent nausea or vomiting Anesthetic complications: no    Last Vitals:  Vitals:   12/14/16 2130 12/14/16 2145  BP:    Pulse: 80 88  Resp: (!) 24 12  Temp: 36.8 C 36.8 C  SpO2: 97% 97%    Last Pain:  Vitals:   12/14/16 1348  TempSrc:   PainSc: 4                  Zaylin Runco,W. EDMOND

## 2016-12-14 NOTE — Anesthesia Procedure Notes (Signed)
Arterial Line Insertion Start/End10/30/2018 2:48 PM, 12/14/2016 2:52 PM Performed by: Verdie Drown, CRNA  Patient location: OR. Preanesthetic checklist: patient identified, IV checked, site marked, risks and benefits discussed, surgical consent, monitors and equipment checked, pre-op evaluation and anesthesia consent Lidocaine 1% used for infiltration and patient sedated Left, radial was placed Catheter size: 20 G Hand hygiene performed  and maximum sterile barriers used   Attempts: 1 Procedure performed without using ultrasound guided technique. Ultrasound Notes:anatomy identified and no ultrasound evidence of intravascular and/or intraneural injection Following insertion, dressing applied and Biopatch. Post procedure assessment: normal  Patient tolerated the procedure well with no immediate complications.

## 2016-12-14 NOTE — Anesthesia Procedure Notes (Signed)
Central Venous Catheter Insertion Performed by: Oleta Mouse, anesthesiologist Start/End10/30/2018 3:01 PM, 12/14/2016 3:12 PM Patient location: OR. Preanesthetic checklist: patient identified, IV checked, site marked, risks and benefits discussed, surgical consent, monitors and equipment checked, pre-op evaluation, timeout performed and anesthesia consent Hand hygiene performed  and maximum sterile barriers used  PA cath was placed.Swan type:thermodilution Procedure performed without using ultrasound guided technique. Attempts: 1 Patient tolerated the procedure well with no immediate complications.

## 2016-12-14 NOTE — Progress Notes (Signed)
Stable on vent after emergency CABG for cardiogenic shock, L main stenosis  Will leave on vent until am

## 2016-12-14 NOTE — Interval H&P Note (Signed)
History and Physical Interval Note:  12/14/2016 12:43 PM  Emily Rocha  has presented today for cardiac cath with the diagnosis of Nstemi  The various methods of treatment have been discussed with the patient and family. After consideration of risks, benefits and other options for treatment, the patient has consented to  Procedure(s): LEFT HEART CATH AND CORONARY ANGIOGRAPHY (N/A) as a surgical intervention .  The patient's history has been reviewed, patient examined, no change in status, stable for surgery.  I have reviewed the patient's chart and labs.  Questions were answered to the patient's satisfaction.    Cath Lab Visit (complete for each Cath Lab visit)  Clinical Evaluation Leading to the Procedure:   ACS: Yes.    Non-ACS:    Anginal Classification: CCS III  Anti-ischemic medical therapy: No Therapy  Non-Invasive Test Results: No non-invasive testing performed  Prior CABG: No previous CABG         Lauree Chandler

## 2016-12-14 NOTE — Progress Notes (Signed)
[facility] TEAM 1 - Stepdown/ICU TEAM  Emily Rocha  WSF:[id number] DOB: Jul 01, 1955 DOA: 12/12/2016 PCP: Glenda Chroman, MD    Brief Narrative:  61 y.o. female with a history of asthma/COPD and GERD who presented to the ED with 3 months of progressively worsening off/on chest pressure associated with dyspnea.   In the ED she was found to have a troponin of 2.20.  CTangio was negative for PE.    Subjective: Pt taken to cath lab today where a severe stenosis of the L main was noted.  TCTS urgently consulted and pt is being taken for emergent CABG.    Pt not examined by [facility] today.   No significant active issues beyond severe CAD.  TR[facility] will sign off.  Please feel free to call should medical consultation be desired.    Assessment & Plan:  CAD  Possible PNA v/s Pulmonary Edema  I doubt an actual infectious PNA - low threshold to stop abx after 3 days if stable   COPD  GERD  DVT prophylaxis: IV heparin  Code Status: FULL CODE  Consultants:  Cards TCTS  Procedures: none  Antimicrobials:  Azithro Rocephin   Objective: Blood pressure (!) 75/47, pulse (!) 112, temperature (!) 97.1 F (36.2 C), temperature source Oral, resp. rate (!) 29, height 5\' 4"  (1.626 m), weight 84.2 kg (185 lb 10 oz), SpO2 91 %.  Intake/Output Summary (Last 24 hours) at 12/14/16 1612 Last data filed at 12/14/16 1000  Gross per 24 hour  Intake          1533.14 ml  Output              750 ml  Net           783.14 ml   Filed Weights   12/12/16 1753 12/13/16 0649  Weight: 81.6 kg (180 lb) 84.2 kg (185 lb 10 oz)    Examination: No exam today   CBC:  Recent Labs Lab 12/12/16 1803 12/12/16 2011 12/13/16 0554 12/14/16 0452  WBC 9.5  --  6.3 8.8  NEUTROABS 7.2  --   --   --   HGB 13.6 15.0 12.9 12.4  HCT 42.1 44.0 40.3 39.7  MCV 90.1  --  89.2 89.4  PLT 233  --  213 017   Basic Metabolic Panel:  Recent Labs Lab 12/12/16 1857 12/12/16 2011 12/12/16 2301 12/13/16 1023 12/14/16 0452    NA 138 137  --  135 135  K 3.7 3.8  --  4.1 4.1  CL 100* 100*  --  98* 96*  CO2 24  --   --  26 28  GLUCOSE 115* 118*  --  150* 109*  BUN 11 10  --  9 16  CREATININE 0.58 0.50  --  0.64 0.83  CALCIUM 9.3  --   --  8.7* 9.0  MG  --   --  1.9 2.3 2.4   GFR: Estimated Creatinine Clearance: 72.8 mL/min (by C-G formula based on SCr of 0.83 mg/dL).  Liver Function Tests:  Recent Labs Lab 12/12/16 1857  AST 53*  ALT 110*  ALKPHOS 101  BILITOT 1.1  PROT 7.7  ALBUMIN 4.1    Coagulation Profile:  Recent Labs Lab 12/12/16 1854  INR 1.01    Cardiac Enzymes:  Recent Labs Lab 12/12/16 1856 12/12/16 2301 12/13/16 0554  TROPONINI 2.20* 2.15* 2.02*     Recent Results (from the past 240 hour(s))  Culture, blood (routine x 2)  Status: None (Preliminary result)   Collection Time: 12/12/16  7:41 PM  Result Value Ref Range Status   Specimen Description BLOOD RIGHT ARM  Final   Special Requests   Final    BOTTLES DRAWN AEROBIC AND ANAEROBIC Blood Culture adequate volume   Culture NO GROWTH 2 DAYS  Final   Report Status PENDING  Incomplete  Culture, blood (routine x 2)     Status: None (Preliminary result)   Collection Time: 12/12/16  7:49 PM  Result Value Ref Range Status   Specimen Description BLOOD RIGHT HAND  Final   Special Requests   Final    BOTTLES DRAWN AEROBIC AND ANAEROBIC Blood Culture adequate volume   Culture NO GROWTH 2 DAYS  Final   Report Status PENDING  Incomplete  MRSA PCR Screening     Status: None   Collection Time: 12/13/16  7:49 AM  Result Value Ref Range Status   MRSA by PCR NEGATIVE NEGATIVE Final    Comment:        The GeneXpert MRSA Assay (FDA approved for NASAL specimens only), is one component of a comprehensive MRSA colonization surveillance program. It is not intended to diagnose MRSA infection nor to guide or monitor treatment for MRSA infections.      Scheduled Meds: . [MAR Hold] aspirin EC  81 mg Oral QHS  . [MAR Hold]  atorvastatin  40 mg Oral Daily  . bisacodyl  5 mg Oral Once  . [START ON 12/15/2016] chlorhexidine  15 mL Mouth/Throat Once  . Chlorhexidine Gluconate Cloth  6 each Topical Once  . [MAR Hold] cholecalciferol  2,000 Units Oral Daily  . [MAR Hold] ipratropium  0.5 mg Nebulization TID  . [MAR Hold] levalbuterol  1.25 mg Nebulization TID  . magnesium sulfate  40 mEq Other To OR  . [START ON 12/15/2016] metoprolol tartrate  12.5 mg Oral Once  . [MAR Hold] metoprolol tartrate  25 mg Oral BID  . [MAR Hold] pantoprazole  40 mg Oral Daily  . potassium chloride  80 mEq Other To OR  . sodium chloride flush  3 mL Intravenous Q12H  . tranexamic acid  2 mg/kg Intracatheter To OR     LOS: 2 days   Cherene Altes, MD [facility] Office  [phone] Pager - Text Page per Amion as per below:  On-Call/Text Page:      [email or web address]      password [email or web address]  If 7PM-7AM, please contact night-coverage [email or web address] Password [email or web address] 12/14/2016, 4:12 PM

## 2016-12-14 NOTE — Progress Notes (Signed)
Day of Surgery Procedure(s) (LRB): CORONARY ARTERY BYPASS GRAFTING (CABG) (N/A) TRANSESOPHAGEAL ECHOCARDIOGRAM (TEE) (N/A) Subjective: Patient examined, coronary angiograms performed today personally reviewed The patient's condition and cardiac catheterization and therapeutic options discussed with patient's cardiologist for coordination of care.  61 year old obese nondiabetic reformed smoker admitted from outside hospital with shortness of breath and positive cardiac enzymes.  Echocardiogram showed EF 40%, with mild-moderate MR.  She was transferred to this hospital for cardiac catheterization which was performed today for being hospitalized 72 hours.  The findings included a 95% left main stenosis, significant LV dysfunction, LVEDP 40 mmHg, Because of the extreme left main stenosis she is felt to be candidate for emergency CABG. Objective: Vital signs in last 24 hours: Temp:  [97.1 F (36.2 C)-98.7 F (37.1 C)] 97.1 F (36.2 C) (10/30 0550) Pulse Rate:  [0-121] 112 (10/30 1420) Cardiac Rhythm: Sinus tachycardia (10/30 1349) Resp:  [0-41] 29 (10/30 1420) BP: (75-144)/(47-88) 75/47 (10/30 1420) SpO2:  [0 %-96 %] 91 % (10/30 1420)  Hemodynamic parameters for last 24 hours:   Sinus rhythm afebrile  Intake/Output from previous day: 10/29 0701 - 10/30 0700 In: 1783.1 [P.O.:650; I.V.:833.1; IV Piggyback:300] Out: 1350 [Urine:1350] Intake/Output this shift: Total I/O In: -  Out: 150 [Urine:150]       Exam    General- alert and comfortable   Lungs-scattered rhonchi and rales   Cor- regular rate and rhythm, no murmur , gallop   Abdomen- soft, non-tender   Extremities - warm, non-tender, minimal edema   Neuro- oriented, appropriate, no focal weakness   Lab Results:  Recent Labs  12/13/16 0554 12/14/16 0452  WBC 6.3 8.8  HGB 12.9 12.4  HCT 40.3 39.7  PLT 213 256   BMET:  Recent Labs  12/13/16 1023 12/14/16 0452  NA 135 135  K 4.1 4.1  CL 98* 96*  CO2 26 28   GLUCOSE 150* 109*  BUN 9 16  CREATININE 0.64 0.83  CALCIUM 8.7* 9.0    PT/INR:  Recent Labs  12/12/16 1854  LABPROT 13.2  INR 1.01   ABG    Component Value Date/Time   TCO2 25 12/12/2016 2011   CBG (last 3)  No results for input(s): GLUCAP in the last 72 hours.  Assessment/Plan: S/P Procedure(s) (LRB): CORONARY ARTERY BYPASS GRAFTING (CABG) (N/A) TRANSESOPHAGEAL ECHOCARDIOGRAM (TEE) (N/A) Plan emergency CABG Indications benefits and risks discussed with patient and family at the bedside in the Cath Lab holding area   LOS: 2 days    Emily Rocha 12/14/2016

## 2016-12-15 ENCOUNTER — Encounter (HOSPITAL_COMMUNITY): Payer: Self-pay | Admitting: Cardiovascular Disease

## 2016-12-15 ENCOUNTER — Inpatient Hospital Stay (HOSPITAL_COMMUNITY): Payer: BLUE CROSS/BLUE SHIELD

## 2016-12-15 DIAGNOSIS — R570 Cardiogenic shock: Secondary | ICD-10-CM

## 2016-12-15 LAB — BPAM FFP
Blood Product Expiration Date: 201810312359
Blood Product Expiration Date: 201810312359
ISSUE DATE / TIME: 201810301815
ISSUE DATE / TIME: 201810301815
UNIT TYPE AND RH: 6200
Unit Type and Rh: 6200

## 2016-12-15 LAB — PREPARE FRESH FROZEN PLASMA
UNIT DIVISION: 0
Unit division: 0

## 2016-12-15 LAB — POCT I-STAT 3, ART BLOOD GAS (G3+)
ACID-BASE EXCESS: 2 mmol/L (ref 0.0–2.0)
ACID-BASE EXCESS: 2 mmol/L (ref 0.0–2.0)
Acid-Base Excess: 4 mmol/L — ABNORMAL HIGH (ref 0.0–2.0)
Acid-Base Excess: 5 mmol/L — ABNORMAL HIGH (ref 0.0–2.0)
Acid-Base Excess: 1 mmol/L (ref 0.0–2.0)
BICARBONATE: 25.9 mmol/L (ref 20.0–28.0)
BICARBONATE: 26.6 mmol/L (ref 20.0–28.0)
Bicarbonate: 28 mmol/L (ref 20.0–28.0)
Bicarbonate: 29.4 mmol/L — ABNORMAL HIGH (ref 20.0–28.0)
Bicarbonate: 26.9 mmol/L (ref 20.0–28.0)
Bicarbonate: 30.9 mmol/L — ABNORMAL HIGH (ref 20.0–28.0)
O2 SAT: 97 %
O2 SAT: 81 %
O2 SAT: 100 %
O2 Saturation: 99 %
O2 Saturation: 99 %
O2 Saturation: 99 %
PCO2 ART: 46.6 mmHg (ref 32.0–48.0)
PCO2 ART: 46 mmHg (ref 32.0–48.0)
PH ART: 7.412 (ref 7.350–7.450)
PH ART: 7.349 — AB (ref 7.350–7.450)
PH ART: 7.404 (ref 7.350–7.450)
PO2 ART: 94 mmHg (ref 83.0–108.0)
PO2 ART: 46 mmHg — AB (ref 83.0–108.0)
PO2 ART: 165 mmHg — AB (ref 83.0–108.0)
Patient temperature: 36.7
Patient temperature: 37.5
TCO2: 29 mmol/L (ref 22–32)
TCO2: 31 mmol/L (ref 22–32)
TCO2: 27 mmol/L (ref 22–32)
TCO2: 28 mmol/L (ref 22–32)
TCO2: 32 mmol/L (ref 22–32)
TCO2: 28 mmol/L (ref 22–32)
pCO2 arterial: 47 mmHg (ref 32.0–48.0)
pCO2 arterial: 43 mmHg (ref 32.0–48.0)
pCO2 arterial: 52.5 mmHg — ABNORMAL HIGH (ref 32.0–48.0)
pCO2 arterial: 48.4 mmHg — ABNORMAL HIGH (ref 32.0–48.0)
pH, Arterial: 7.385 (ref 7.350–7.450)
pH, Arterial: 7.378 (ref 7.350–7.450)
pH, Arterial: 7.35 (ref 7.350–7.450)
pO2, Arterial: 149 mmHg — ABNORMAL HIGH (ref 83.0–108.0)
pO2, Arterial: 125 mmHg — ABNORMAL HIGH (ref 83.0–108.0)
pO2, Arterial: 430 mmHg — ABNORMAL HIGH (ref 83.0–108.0)

## 2016-12-15 LAB — PREPARE PLATELET PHERESIS: Unit division: 0

## 2016-12-15 LAB — BASIC METABOLIC PANEL
Anion gap: 6 (ref 5–15)
BUN: 15 mg/dL (ref 6–20)
CO2: 24 mmol/L (ref 22–32)
CREATININE: 0.73 mg/dL (ref 0.44–1.00)
Calcium: 8.7 mg/dL — ABNORMAL LOW (ref 8.9–10.3)
Chloride: 104 mmol/L (ref 101–111)
GFR calc non Af Amer: >60 mL/min (ref >60)
Glucose, Bld: 160 mg/dL — ABNORMAL HIGH (ref 65–99)
Potassium: 3.9 mmol/L (ref 3.5–5.1)
SODIUM: 134 mmol/L — AB (ref 135–145)

## 2016-12-15 LAB — BPAM PLATELET PHERESIS
BLOOD PRODUCT EXPIRATION DATE: 201810312359
ISSUE DATE / TIME: 201810301819
UNIT TYPE AND RH: 6200

## 2016-12-15 LAB — COOXEMETRY PANEL
Carboxyhemoglobin: 0.6 % (ref 0.5–1.5)
Methemoglobin: 1.1 % (ref 0.0–1.5)
O2 Saturation: 74.2 %
Total hemoglobin: 9.5 g/dL — ABNORMAL LOW (ref 12.0–16.0)

## 2016-12-15 LAB — GLUCOSE, CAPILLARY
GLUCOSE-CAPILLARY: 100 mg/dL — AB (ref 65–99)
GLUCOSE-CAPILLARY: 117 mg/dL — AB (ref 65–99)
GLUCOSE-CAPILLARY: 127 mg/dL — AB (ref 65–99)
GLUCOSE-CAPILLARY: 128 mg/dL — AB (ref 65–99)
GLUCOSE-CAPILLARY: 132 mg/dL — AB (ref 65–99)
GLUCOSE-CAPILLARY: 146 mg/dL — AB (ref 65–99)
GLUCOSE-CAPILLARY: 165 mg/dL — AB (ref 65–99)
GLUCOSE-CAPILLARY: 166 mg/dL — AB (ref 65–99)
GLUCOSE-CAPILLARY: 85 mg/dL (ref 65–99)
Glucose-Capillary: 123 mg/dL — ABNORMAL HIGH (ref 65–99)
Glucose-Capillary: 159 mg/dL — ABNORMAL HIGH (ref 65–99)
Glucose-Capillary: 87 mg/dL (ref 65–99)
Glucose-Capillary: 145 mg/dL — ABNORMAL HIGH (ref 65–99)
Glucose-Capillary: 111 mg/dL — ABNORMAL HIGH (ref 65–99)
Glucose-Capillary: 119 mg/dL — ABNORMAL HIGH (ref 65–99)
Glucose-Capillary: 128 mg/dL — ABNORMAL HIGH (ref 65–99)
Glucose-Capillary: 147 mg/dL — ABNORMAL HIGH (ref 65–99)

## 2016-12-15 LAB — CBC
HCT: 26.4 % — ABNORMAL LOW (ref 36.0–46.0)
Hemoglobin: 8.7 g/dL — ABNORMAL LOW (ref 12.0–15.0)
MCH: 27 pg (ref 26.0–34.0)
MCHC: 33 g/dL (ref 30.0–36.0)
MCV: 82 fL (ref 78.0–100.0)
PLATELETS: 208 10*3/uL (ref 150–400)
RBC: 3.22 MIL/uL — AB (ref 3.87–5.11)
RDW: 20.7 % — AB (ref 11.5–15.5)
WBC: 10.1 10*3/uL (ref 4.0–10.5)

## 2016-12-15 LAB — POCT I-STAT, CHEM 8
BUN: 15 mg/dL (ref 6–20)
CALCIUM ION: 1.21 mmol/L (ref 1.15–1.40)
Chloride: 98 mmol/L — ABNORMAL LOW (ref 101–111)
Creatinine, Ser: 0.7 mg/dL (ref 0.44–1.00)
GLUCOSE: 166 mg/dL — AB (ref 65–99)
HCT: 25 % — ABNORMAL LOW (ref 36.0–46.0)
HEMOGLOBIN: 8.5 g/dL — AB (ref 12.0–15.0)
Potassium: 4.3 mmol/L (ref 3.5–5.1)
SODIUM: 132 mmol/L — AB (ref 135–145)
TCO2: 25 mmol/L (ref 22–32)

## 2016-12-15 LAB — POCT I-STAT 4, (NA,K, GLUC, HGB,HCT)
Glucose, Bld: 155 mg/dL — ABNORMAL HIGH (ref 65–99)
HEMATOCRIT: 27 % — AB (ref 36.0–46.0)
Hemoglobin: 9.2 g/dL — ABNORMAL LOW (ref 12.0–15.0)
POTASSIUM: 3.6 mmol/L (ref 3.5–5.1)
SODIUM: 139 mmol/L (ref 135–145)

## 2016-12-15 LAB — PROCALCITONIN: PROCALCITONIN: 0.62 ng/mL

## 2016-12-15 LAB — MAGNESIUM: MAGNESIUM: 3 mg/dL — AB (ref 1.7–2.4)

## 2016-12-15 MED ORDER — AMIODARONE HCL IN DEXTROSE 360-4.14 MG/200ML-% IV SOLN: 30.0000 mg/h | INTRAVENOUS | Status: DC

## 2016-12-15 MED ORDER — LEVALBUTEROL HCL 1.25 MG/0.5ML IN NEBU
1.2500 mg | INHALATION_SOLUTION | RESPIRATORY_TRACT | Status: DC
  Administered 2016-12-15 – 2016-12-16 (×6): 1.25 mg via RESPIRATORY_TRACT
  Filled 2016-12-15 (×5): qty 0.5

## 2016-12-15 MED ORDER — INSULIN DETEMIR 100 UNIT/ML ~~LOC~~ SOLN: 10.0000 [IU] | Freq: Two times a day (BID) | SUBCUTANEOUS | Status: DC

## 2016-12-15 MED ORDER — VASOPRESSIN 20 UNIT/ML IV SOLN
0.0100 [IU]/min | INTRAVENOUS | Status: DC
  Administered 2016-12-15: 0.02 [IU]/min via INTRAVENOUS
  Filled 2016-12-15: qty 2

## 2016-12-15 MED ORDER — IPRATROPIUM BROMIDE 0.02 % IN SOLN
0.5000 mg | RESPIRATORY_TRACT | Status: DC
  Administered 2016-12-15 – 2016-12-16 (×6): 0.5 mg via RESPIRATORY_TRACT
  Filled 2016-12-15 (×5): qty 2.5

## 2016-12-15 MED ORDER — ORAL CARE MOUTH RINSE
15.0000 mL | Freq: Two times a day (BID) | OROMUCOSAL | Status: DC
  Administered 2016-12-15: 15 mL via OROMUCOSAL

## 2016-12-15 MED ORDER — MIDAZOLAM HCL 2 MG/2ML IJ SOLN: 1.0000 mg | INTRAMUSCULAR | Status: DC | PRN

## 2016-12-15 MED ORDER — FUROSEMIDE 10 MG/ML IJ SOLN
40.0000 mg | Freq: Every day | INTRAMUSCULAR | Status: DC
  Administered 2016-12-15: 40 mg via INTRAVENOUS
  Filled 2016-12-15: qty 4

## 2016-12-15 MED ORDER — AMIODARONE HCL IN DEXTROSE 360-4.14 MG/200ML-% IV SOLN: 60.0000 mg/h | INTRAVENOUS | Status: DC

## 2016-12-15 MED ORDER — ORAL CARE MOUTH RINSE
15.0000 mL | Freq: Two times a day (BID) | OROMUCOSAL | Status: DC
  Administered 2016-12-15 – 2016-12-21 (×10): 15 mL via OROMUCOSAL

## 2016-12-15 MED ORDER — AMIODARONE HCL IN DEXTROSE 360-4.14 MG/200ML-% IV SOLN
60.0000 mg/h | INTRAVENOUS | Status: DC
  Administered 2016-12-15 – 2016-12-16 (×2): 60 mg/h via INTRAVENOUS
  Filled 2016-12-15 (×2): qty 200

## 2016-12-15 MED ORDER — FUROSEMIDE 10 MG/ML IJ SOLN
40.0000 mg | Freq: Once | INTRAMUSCULAR | Status: AC
  Administered 2016-12-15: 40 mg via INTRAVENOUS
  Filled 2016-12-15: qty 4

## 2016-12-15 MED ORDER — ALBUMIN HUMAN 5 % IV SOLN: 12.5000 g | Freq: Once | INTRAVENOUS | Status: AC

## 2016-12-15 MED ORDER — INSULIN ASPART 100 UNIT/ML ~~LOC~~ SOLN
0.0000 [IU] | SUBCUTANEOUS | Status: DC
  Administered 2016-12-15: 4 [IU] via SUBCUTANEOUS
  Administered 2016-12-15: 2 [IU] via SUBCUTANEOUS
  Administered 2016-12-16: 4 [IU] via SUBCUTANEOUS
  Administered 2016-12-16: 8 [IU] via SUBCUTANEOUS
  Administered 2016-12-16: 4 [IU] via SUBCUTANEOUS
  Administered 2016-12-16: 2 [IU] via SUBCUTANEOUS
  Administered 2016-12-16: 4 [IU] via SUBCUTANEOUS
  Administered 2016-12-16 – 2016-12-17 (×4): 2 [IU] via SUBCUTANEOUS

## 2016-12-15 MED ORDER — INSULIN DETEMIR 100 UNIT/ML ~~LOC~~ SOLN
14.0000 [IU] | Freq: Two times a day (BID) | SUBCUTANEOUS | Status: DC
  Administered 2016-12-15 (×2): 14 [IU] via SUBCUTANEOUS
  Filled 2016-12-15 (×3): qty 0.14

## 2016-12-15 MED ORDER — AMIODARONE LOAD VIA INFUSION
150.0000 mg | Freq: Once | INTRAVENOUS | Status: AC
  Administered 2016-12-15: 150 mg via INTRAVENOUS
  Filled 2016-12-15: qty 83.34

## 2016-12-15 MED ORDER — CHLORHEXIDINE GLUCONATE 0.12 % MT SOLN: 15.0000 mL | Freq: Two times a day (BID) | OROMUCOSAL | Status: DC

## 2016-12-15 NOTE — Progress Notes (Signed)
      [address]       [place] [address]             [phone]      Resting comfortably  BP 108/72   Pulse (!) 101   Temp (!) 100.6 F (38.1 C)   Resp (!) 31   Ht 5\' 4"  (1.626 m)   Wt 201 lb 4.5 oz (91.3 kg)   SpO2 100%   BMI 34.55 kg/m  PA 33/25 CI= 2.2  Intake/Output Summary (Last 24 hours) at 12/15/16 2134 Last data filed at 12/15/16 2114  Gross per 24 hour  Intake          5153.37 ml  Output             1740 ml  Net          3413.37 ml   K= 4.3, Cr= 0.7, Hct= 25  Currently in SR on amiodarone  Emily Rocha C. [name], MD [facility] [phone]

## 2016-12-15 NOTE — Progress Notes (Signed)
Notified Dr. Prescott Gum of continued a-fib rate 110-120's; new orders received for vaso gtt, increase amiodarone gtt back to 60 and wean levophed gtt to 2mcg.  Will continue to monitor.

## 2016-12-15 NOTE — Progress Notes (Signed)
1 Day Post-Op Procedure(s) (LRB): CORONARY ARTERY BYPASS GRAFTING (CABG) x three , using left internal mammary artery and right leg greater saphenous vein harvested endoscopically (N/A) TRANSESOPHAGEAL ECHOCARDIOGRAM (TEE) (N/A) Subjective: Status post emergency multivessel CABG for non-STemi and preoperative cardiogenic shock  Patient extubated with stable hemodynamics but on moderate dose inotropic support-milrinone and epinephrine  Sinus rhythm, neuro intact Chest x-ray with improved edema Patient extubated a.m. of postop day 1 Renal function adequate  Objective: Vital signs in last 24 hours: Temp:  [97.5 F (36.4 C)-98.8 F (37.1 C)] 98.6 F (37 C) (10/31 1041) Pulse Rate:  [0-121] 87 (10/31 1041) Cardiac Rhythm: Normal sinus rhythm;Atrial paced (10/31 0745) Resp:  [0-37] 27 (10/31 1041) BP: (73-144)/(39-85) 86/49 (10/31 1041) SpO2:  [0 %-100 %] 100 % (10/31 1044) Arterial Line BP: (73-130)/(43-69) 102/50 (10/31 0845) FiO2 (%):  [40 %-50 %] 40 % (10/31 0809) Weight:  [201 lb 4.5 oz (91.3 kg)] 201 lb 4.5 oz (91.3 kg) (10/31 0430)  Hemodynamic parameters for last 24 hours: PAP: (26-49)/(17-32) 27/19 CO:  [3.3 L/min-4.2 L/min] 4 L/min CI:  [1.7 L/min/m2-2.2 L/min/m2] 2.1 L/min/m2  Intake/Output from previous day: 10/30 0701 - 10/31 0700 In: 4300.8 [I.V.:2132.8; MEQAS:3419; NG/GT:30; IV Piggyback:1000] Out: 2120 [Urine:1470; Blood:400; Chest Tube:250] Intake/Output this shift: No intake/output data recorded.       Exam    General- alert and comfortable   Lungs- clear without rales, wheezes   Cor- regular rate and rhythm, no murmur , gallop   Abdomen- soft, non-tender   Extremities - warm, non-tender, minimal edema   Neuro- oriented, appropriate, no focal weakness   Lab Results:  Recent Labs  12/14/16 2136 12/15/16 0312  WBC 11.3* 10.1  HGB 9.0* 8.7*  HCT 27.5* 26.4*  PLT 196 208   BMET:  Recent Labs  12/14/16 0452  12/14/16 1956 12/14/16 2127  12/15/16 0328  NA 135  < > 137 139 134*  K 4.1  < > 3.9 3.6 3.9  CL 96*  < > 99*  --  104  CO2 28  --   --   --  24  GLUCOSE 109*  < > 177* 155* 160*  BUN 16  < > 17  --  15  CREATININE 0.83  < > 0.50  --  0.73  CALCIUM 9.0  --   --   --  8.7*  < > = values in this interval not displayed.  PT/INR:  Recent Labs  12/14/16 2136  LABPROT 17.1*  INR 1.40   ABG    Component Value Date/Time   PHART 7.349 (L) 12/15/2016 0847   HCO3 25.9 12/15/2016 0847   TCO2 27 12/15/2016 0847   O2SAT 99.0 12/15/2016 0847   CBG (last 3)   Recent Labs  12/15/16 0723 12/15/16 0822 12/15/16 0950  GLUCAP 132* 145* 117*    Assessment/Plan: S/P Procedure(s) (LRB): CORONARY ARTERY BYPASS GRAFTING (CABG) x three , using left internal mammary artery and right leg greater saphenous vein harvested endoscopically (N/A) TRANSESOPHAGEAL ECHOCARDIOGRAM (TEE) (N/A) Extubate mobilize/dangle on side of bed Leave PA catheter today to monitor weaning of inotropes Diabetic control Diuresis Leave chest tubes in place for preoperative pleural effusions   LOS: 3 days    Tharon Aquas Trigt III 12/15/2016

## 2016-12-15 NOTE — Progress Notes (Signed)
Patient having multiple PVCs and has converted to A-fib rate 120's; Pacer changed to back up rate AAI @60 .  Dr. Prescott Gum notified and orders received to give 150mg  amiodarone bolus.  Will continue to monitor.

## 2016-12-15 NOTE — Progress Notes (Signed)
Dr. Prescott Gum notified of patients post extubation ABG; orders received for BiPAP and ABG 1 hour post initiation of BiPAP.  Will continue to monitor.

## 2016-12-15 NOTE — Progress Notes (Signed)
Progress Note  Patient Name: Emily Rocha Date of Encounter: 12/15/2016  Primary Cardiologist:   Dr. Harl Bowie  Subjective   Intubated but awake. Denies acute pain.    Inpatient Medications    Scheduled Meds: . acetaminophen  1,000 mg Oral Q6H   Or  . acetaminophen (TYLENOL) oral liquid 160 mg/5 mL  1,000 mg Per Tube Q6H  . amiodarone  150 mg Intravenous Once  . aspirin EC  325 mg Oral Daily   Or  . aspirin  324 mg Per Tube Daily  . atorvastatin  40 mg Oral Daily  . bisacodyl  10 mg Oral Daily   Or  . bisacodyl  10 mg Rectal Daily  . docusate sodium  200 mg Oral Daily  . furosemide  40 mg Intravenous Daily  . insulin regular  0-10 Units Intravenous TID WC  . ipratropium  0.5 mg Nebulization TID  . levalbuterol  1.25 mg Nebulization TID  . metoCLOPramide (REGLAN) injection  10 mg Intravenous Q6H  . metoprolol tartrate  12.5 mg Oral BID   Or  . metoprolol tartrate  12.5 mg Per Tube BID  . mometasone-formoterol  2 puff Inhalation BID  . [START ON 12/16/2016] pantoprazole  40 mg Oral Daily  . sodium chloride flush  3 mL Intravenous Q12H   Continuous Infusions: . sodium chloride    . sodium chloride    . sodium chloride 20 mL/hr at 12/14/16 2200  . albumin human    . amiodarone 30 mg/hr (12/15/16 0500)  . cefTRIAXone (ROCEPHIN)  IV Stopped (12/14/16 2238)  . EPINEPHrine 4 mg in dextrose 5% 250 mL infusion (16 mcg/mL) 5 mcg/min (12/15/16 0700)  . famotidine (PEPCID) IV Stopped (12/14/16 2240)  . insulin (NOVOLIN-R) infusion 1.7 Units/hr (12/15/16 0600)  . lactated ringers 20 mL (12/14/16 2204)  . lactated ringers 20 mL (12/14/16 2203)  . milrinone 0.25 mcg/kg/min (12/15/16 0700)  . nitroGLYCERIN    . norepinephrine (LEVOPHED) Adult infusion 10 mcg/min (12/15/16 0700)  . phenylephrine (NEO-SYNEPHRINE) Adult infusion 35 mcg/min (12/15/16 0700)   PRN Meds: sodium chloride, albumin human, fentaNYL (SUBLIMAZE) injection, levalbuterol, metoprolol tartrate, midazolam,  morphine injection, morphine injection, ondansetron (ZOFRAN) IV, oxyCODONE **OR** oxyCODONE, oxyCODONE, sodium chloride flush, traMADol   Vital Signs    Vitals:   12/15/16 0645 12/15/16 0700 12/15/16 0727 12/15/16 0809  BP:  (!) 81/49 (!) 81/49 (!) 81/45  Pulse: 88 88 88 88  Resp: 16 16 19 15   Temp: 98.2 F (36.8 C) 98.2 F (36.8 C)    TempSrc:      SpO2: 100% 100% 99% 100%  Weight:      Height:        Intake/Output Summary (Last 24 hours) at 12/15/16 0818 Last data filed at 12/15/16 0700  Gross per 24 hour  Intake           4300.8 ml  Output             2020 ml  Net           2280.8 ml   Filed Weights   12/12/16 1753 12/13/16 0649 12/15/16 0430  Weight: 180 lb (81.6 kg) 185 lb 10 oz (84.2 kg) 201 lb 4.5 oz (91.3 kg)    Telemetry    NSR - Personally Reviewed  ECG    NA - Personally Reviewed  Physical Exam   GEN: Intubated and awake.   Cardiac: RRR, no murmurs, rubs, or gallops.  Respiratory:   Decreased breath sounds  GI:  Positive bowel sounds, no rebound no guarding MS:  Diffuse mild edema Neuro:  Nonfocal   Labs    Chemistry Recent Labs Lab 12/12/16 1857  12/13/16 1023 12/14/16 0452  12/14/16 1918 12/14/16 1956 12/15/16 0328  NA 138  < > 135 135  < > 133* 137 134*  K 3.7  < > 4.1 4.1  < > 4.8 3.9 3.9  CL 100*  < > 98* 96*  < > 98* 99* 104  CO2 24  --  26 28  --   --   --  24  GLUCOSE 115*  < > 150* 109*  < > 165* 177* 160*  BUN 11  < > 9 16  < > 17 17 15   CREATININE 0.58  < > 0.64 0.83  < > 0.50 0.50 0.73  CALCIUM 9.3  --  8.7* 9.0  --   --   --  8.7*  PROT 7.7  --   --   --   --   --   --   --   ALBUMIN 4.1  --   --   --   --   --   --   --   AST 53*  --   --   --   --   --   --   --   ALT 110*  --   --   --   --   --   --   --   ALKPHOS 101  --   --   --   --   --   --   --   BILITOT 1.1  --   --   --   --   --   --   --   GFRNONAA >60  --  >60 >60  --   --   --  >60  GFRAA >60  --  >60 >60  --   --   --  >60  ANIONGAP 14  --  11 11  --    --   --  6  < > = values in this interval not displayed.   Hematology Recent Labs Lab 12/14/16 0452  12/14/16 1853  12/14/16 1956 12/14/16 2136 12/15/16 0312  WBC 8.8  --   --   --   --  11.3* 10.1  RBC 4.44  --   --   --   --  3.34* 3.22*  HGB 12.4  < > 7.6*  < > 7.8* 9.0* 8.7*  HCT 39.7  < > 22.9*  < > 23.0* 27.5* 26.4*  MCV 89.4  --   --   --   --  82.3 82.0  MCH 27.9  --   --   --   --  26.9 27.0  MCHC 31.2  --   --   --   --  32.7 33.0  RDW 14.4  --   --   --   --  21.5* 20.7*  PLT 256  --  142*  --   --  196 208  < > = values in this interval not displayed.  Cardiac Enzymes  Recent Labs Lab 12/12/16 1856 12/12/16 2301 12/13/16 0554  TROPONINI 2.20* 2.15* 2.02*     Recent Labs Lab 12/12/16 1812  TROPIPOC 1.60*     BNP  Recent Labs Lab 12/12/16 1857  BNP 458.0*     DDimer No results for input(s): DDIMER in the last 168 hours.  Radiology    Dg Chest Port 1 View  Result Date: 12/14/2016 CLINICAL DATA:  Status post CABG x3. EXAM: PORTABLE CHEST 1 VIEW COMPARISON:  12/12/2016. FINDINGS: Cardiomegaly, median sternotomy, and post CABG sequelae. Swan-Ganz catheter tip RIGHT main pulmonary artery. BILATERAL chest tubes and mediastinal tubes in good position, no pneumothorax. ETT exact localization obscured by overlying support apparatus, estimated 3.6 cm above carina. Enteric tube lies within the stomach. Worsening RIGHT mid and lower lung zone opacities, compared to the preoperative radiograph, could represent developing pneumonia or asymmetric edema. No significant pleural effusion. IMPRESSION: Post CABG support apparatus appears satisfactory position. No pneumothorax. Worsening aeration, with increasing opacity in RIGHT middle and lower lung zones. Query pneumonia or asymmetric edema. Electronically Signed   By: Staci Righter M.D.   On: 12/14/2016 21:59    Cardiac Studies   ECHO:  12/13/16     - Left ventricle: Septal and inferior basal hypokinesis.  Systolic   function was mildly reduced. The estimated ejection fraction was   in the range of 45% to 50%. Doppler parameters are consistent   with both elevated ventricular end-diastolic filling pressure and   elevated left atrial filling pressure. - Aortic valve: There was mild stenosis. Valve area (VTI): 1.56   cm^2. Valve area (Vmax): 1.54 cm^2. Valve area (Vmean): 1.51   cm^2. - Mitral valve: There was mild to moderate regurgitation. Valve   area by continuity equation (using LVOT flow): 2.73 cm^2. - Left atrium: The atrium was mildly dilated. - Atrial septum: No defect or patent foramen ovale was identified. - Pulmonary arteries: PA peak pressure: 47 mm Hg (S).    Cardiac Cath:    Conclusion     Mid RCA lesion, 60 %stenosed.  Prox RCA lesion, 40 %stenosed.  Ost RPDA to RPDA lesion, 90 %stenosed.  Ost Cx to Prox Cx lesion, 100 %stenosed.  Mid LAD lesion, 80 %stenosed.  Ost 2nd Diag to 2nd Diag lesion, 40 %stenosed.  Prox LAD to Mid LAD lesion, 40 %stenosed.  Ost LM to LM lesion, 99 %stenosed.   1. Severe distal left main stenosis 2. Severe mid LAD stenosis 3. 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  4. Moderately severe, calcified stenosis of the mid RCA 5. Elevated filling pressures.        Patient Profile     61 y.o. female with a hx of carotid artery stenosis s/p right CEA (12/06/14), former smoker, COPD, GERD, HLD, and PAD who is being seen for the evaluation of elevated troponin at the request of Dr. Thereasa Solo.  Assessment & Plan    NSTEMI:  Critical left main.  Now status post CABG.  Cardiogenic shock.  Supportive care.  To be extubated this morning.   COPD:  Continues with breathing treatments.  Underlying severe lung disease.    DYSLIPIDEMIA:  Will send home on high dose statin.    CARDIOMYOPATHY:   Ischemic cardiomyopathy with shock.  To be extubated today.  Continue vasopressors per  Dr. Prescott Gum.    Signed, Minus Breeding, MD  12/15/2016, 8:18 AM  '

## 2016-12-15 NOTE — Care Management Note (Addendum)
Case Management Note  Patient Details  Name: Emily Rocha MRN: [id number] Date of Birth: May 28, 1955  Subjective/Objective:    From home with spouse, presents with chest pain, NSTEMI, POD 1 Emergent CABG, conts with chest tubes, extubated, cont to diuresis.  Patient's spouse works every other weekend and will be able to assist patient at home,  She also has a son who works 8 am to 4 pm but he can also assist her as well.    11/2 1056 Tomi Bamberger RN, BSN -  POD 3 CABG, conts on epi and milrinone, conts with chest tube x 2.     11/7 [place], BSN - NCM gave patient the [facility] agency list, she would like to look over it and talk to some of her family members before she chooses an agency.  Left the list with her and will check back with her later. She will need HHPT and rolling walker at discharge.   11/8 [place], BSN - Patient wants to know what is the co pay amount for the HHPT and DME rolling walker before she chooses agency.  NCM awaiting benefit check.              Action/Plan: NCM will follow for dc needs.   Expected Discharge Date:  12/17/16               Expected Discharge Plan:  Home/Self Care  In-House Referral:     Discharge planning Services  CM Consult  Post Acute Care Choice:    Choice offered to:     DME Arranged:    DME Agency:     HH Arranged:    HH Agency:     Status of Service:  In process, will continue to follow  If discussed at Long Length of Stay Meetings, dates discussed:    Additional Comments:  Zenon Mayo, RN 12/15/2016, 4:16 PM

## 2016-12-15 NOTE — Procedures (Signed)
Extubation Procedure Note  Patient Details:   Name: AMORIA MCLEES DOB: 1955/03/23 MRN: [id number]   Airway Documentation: Pt alert and following commands.  NIF -25, VC 1.7 L/min cuff leak audible prior to extubation.  Placed on 4 lpm [place]. Pt had strong cough, asking questions and talking.    Evaluation  O2 sats: stable throughout Complications: No apparent complications Patient did tolerate procedure well. Bilateral Breath Sounds: Clear, Diminished   Yes  Ned Grace 12/15/2016, 8:59 AM

## 2016-12-16 ENCOUNTER — Inpatient Hospital Stay (HOSPITAL_COMMUNITY): Payer: BLUE CROSS/BLUE SHIELD

## 2016-12-16 LAB — BASIC METABOLIC PANEL
Anion gap: 8 (ref 5–15)
BUN: 14 mg/dL (ref 6–20)
CO2: 23 mmol/L (ref 22–32)
Calcium: 8.2 mg/dL — ABNORMAL LOW (ref 8.9–10.3)
Chloride: 98 mmol/L — ABNORMAL LOW (ref 101–111)
Creatinine, Ser: 0.82 mg/dL (ref 0.44–1.00)
GFR calc Af Amer: >60 mL/min (ref >60)
GFR calc non Af Amer: >60 mL/min (ref >60)
Glucose, Bld: 196 mg/dL — ABNORMAL HIGH (ref 65–99)
Potassium: 4.6 mmol/L (ref 3.5–5.1)
Sodium: 129 mmol/L — ABNORMAL LOW (ref 135–145)

## 2016-12-16 LAB — CBC
HCT: 25.6 % — ABNORMAL LOW (ref 36.0–46.0)
HCT: 23.6 % — ABNORMAL LOW (ref 36.0–46.0)
Hemoglobin: 8.3 g/dL — ABNORMAL LOW (ref 12.0–15.0)
Hemoglobin: 7.7 g/dL — ABNORMAL LOW (ref 12.0–15.0)
MCH: 26.5 pg (ref 26.0–34.0)
MCH: 26.6 pg (ref 26.0–34.0)
MCHC: 32.4 g/dL (ref 30.0–36.0)
MCHC: 32.6 g/dL (ref 30.0–36.0)
MCV: 81.8 fL (ref 78.0–100.0)
MCV: 81.7 fL (ref 78.0–100.0)
Platelets: 164 10*3/uL (ref 150–400)
Platelets: 150 10*3/uL (ref 150–400)
RBC: 3.13 MIL/uL — ABNORMAL LOW (ref 3.87–5.11)
RBC: 2.89 MIL/uL — ABNORMAL LOW (ref 3.87–5.11)
RDW: 20.8 % — ABNORMAL HIGH (ref 11.5–15.5)
RDW: 20.5 % — ABNORMAL HIGH (ref 11.5–15.5)
WBC: 11.6 10*3/uL — ABNORMAL HIGH (ref 4.0–10.5)
WBC: 13.1 10*3/uL — ABNORMAL HIGH (ref 4.0–10.5)

## 2016-12-16 LAB — POCT I-STAT, CHEM 8
BUN: 18 mg/dL (ref 6–20)
CALCIUM ION: 1.22 mmol/L (ref 1.15–1.40)
CHLORIDE: 94 mmol/L — AB (ref 101–111)
CREATININE: 0.7 mg/dL (ref 0.44–1.00)
GLUCOSE: 132 mg/dL — AB (ref 65–99)
HCT: 22 % — ABNORMAL LOW (ref 36.0–46.0)
Hemoglobin: 7.5 g/dL — ABNORMAL LOW (ref 12.0–15.0)
Potassium: 4.1 mmol/L (ref 3.5–5.1)
Sodium: 127 mmol/L — ABNORMAL LOW (ref 135–145)
TCO2: 26 mmol/L (ref 22–32)

## 2016-12-16 LAB — GLUCOSE, CAPILLARY
Glucose-Capillary: 187 mg/dL — ABNORMAL HIGH (ref 65–99)
Glucose-Capillary: 193 mg/dL — ABNORMAL HIGH (ref 65–99)
Glucose-Capillary: 201 mg/dL — ABNORMAL HIGH (ref 65–99)
Glucose-Capillary: 160 mg/dL — ABNORMAL HIGH (ref 65–99)
Glucose-Capillary: 178 mg/dL — ABNORMAL HIGH (ref 65–99)

## 2016-12-16 LAB — POCT I-STAT 3, ART BLOOD GAS (G3+)
ACID-BASE EXCESS: 2 mmol/L (ref 0.0–2.0)
Bicarbonate: 26.7 mmol/L (ref 20.0–28.0)
O2 Saturation: 97 %
TCO2: 28 mmol/L (ref 22–32)
pCO2 arterial: 41.3 mmHg (ref 32.0–48.0)
pH, Arterial: 7.421 (ref 7.350–7.450)
pO2, Arterial: 92 mmHg (ref 83.0–108.0)

## 2016-12-16 LAB — PREPARE RBC (CROSSMATCH)

## 2016-12-16 LAB — COOXEMETRY PANEL
Carboxyhemoglobin: 0.9 % (ref 0.5–1.5)
Methemoglobin: 1 % (ref 0.0–1.5)
O2 Saturation: 66.6 %
Total hemoglobin: 6 g/dL — CL (ref 12.0–16.0)

## 2016-12-16 MED ORDER — SODIUM CHLORIDE 0.9% FLUSH
10.0000 mL | Freq: Two times a day (BID) | INTRAVENOUS | Status: DC
  Administered 2016-12-16 – 2016-12-17 (×2): 10 mL
  Administered 2016-12-17: 40 mL
  Administered 2016-12-18: 10 mL
  Administered 2016-12-18: 40 mL
  Administered 2016-12-19 – 2016-12-27 (×11): 10 mL

## 2016-12-16 MED ORDER — SODIUM CHLORIDE 0.9 % IV SOLN: Freq: Once | INTRAVENOUS | Status: DC

## 2016-12-16 MED ORDER — ENOXAPARIN SODIUM 40 MG/0.4ML ~~LOC~~ SOLN
40.0000 mg | SUBCUTANEOUS | Status: DC
  Administered 2016-12-16 – 2016-12-27 (×12): 40 mg via SUBCUTANEOUS
  Filled 2016-12-16 (×12): qty 0.4

## 2016-12-16 MED ORDER — SODIUM CHLORIDE 0.9% FLUSH
10.0000 mL | INTRAVENOUS | Status: DC | PRN
  Administered 2016-12-27: 20 mL
  Filled 2016-12-16: qty 40

## 2016-12-16 MED ORDER — ATORVASTATIN CALCIUM 80 MG PO TABS
80.0000 mg | ORAL_TABLET | Freq: Every day | ORAL | Status: DC
  Administered 2016-12-16 – 2016-12-18 (×3): 80 mg via ORAL
  Filled 2016-12-16 (×3): qty 1

## 2016-12-16 MED ORDER — FUROSEMIDE 10 MG/ML IJ SOLN
40.0000 mg | Freq: Two times a day (BID) | INTRAMUSCULAR | Status: DC
  Administered 2016-12-16 – 2016-12-18 (×4): 40 mg via INTRAVENOUS
  Filled 2016-12-16 (×4): qty 4

## 2016-12-16 MED ORDER — INFLUENZA VAC SPLIT QUAD 0.5 ML IM SUSY: 0.5000 mL | PREFILLED_SYRINGE | INTRAMUSCULAR | Status: DC

## 2016-12-16 MED ORDER — FENTANYL CITRATE (PF) 100 MCG/2ML IJ SOLN
25.0000 ug | INTRAMUSCULAR | Status: DC | PRN
  Administered 2016-12-20 – 2016-12-21 (×2): 25 ug via INTRAVENOUS
  Filled 2016-12-16 (×2): qty 2

## 2016-12-16 MED ORDER — IPRATROPIUM BROMIDE 0.02 % IN SOLN
0.5000 mg | Freq: Three times a day (TID) | RESPIRATORY_TRACT | Status: DC
  Administered 2016-12-16 – 2016-12-27 (×34): 0.5 mg via RESPIRATORY_TRACT
  Filled 2016-12-16 (×35): qty 2.5

## 2016-12-16 MED ORDER — AMIODARONE HCL IN DEXTROSE 360-4.14 MG/200ML-% IV SOLN
30.0000 mg/h | INTRAVENOUS | Status: DC
Start: 2016-12-16 — End: 2016-12-17
  Administered 2016-12-16 (×2): 30 mg/h via INTRAVENOUS
  Filled 2016-12-16 (×2): qty 200

## 2016-12-16 MED ORDER — INSULIN DETEMIR 100 UNIT/ML ~~LOC~~ SOLN
20.0000 [IU] | Freq: Two times a day (BID) | SUBCUTANEOUS | Status: DC
  Administered 2016-12-16 (×2): 20 [IU] via SUBCUTANEOUS
  Filled 2016-12-16 (×3): qty 0.2

## 2016-12-16 MED ORDER — METOLAZONE 5 MG PO TABS
5.0000 mg | ORAL_TABLET | Freq: Once | ORAL | Status: AC
  Administered 2016-12-16: 5 mg via ORAL
  Filled 2016-12-16: qty 1

## 2016-12-16 MED ORDER — LEVALBUTEROL HCL 1.25 MG/0.5ML IN NEBU
1.2500 mg | INHALATION_SOLUTION | Freq: Three times a day (TID) | RESPIRATORY_TRACT | Status: DC
  Administered 2016-12-16 – 2016-12-27 (×34): 1.25 mg via RESPIRATORY_TRACT
  Filled 2016-12-16 (×35): qty 0.5

## 2016-12-16 MED FILL — Heparin Sodium (Porcine) Inj 1000 Unit/ML: INTRAMUSCULAR | Qty: 30 | Status: AC

## 2016-12-16 MED FILL — Potassium Chloride Inj 2 mEq/ML: INTRAVENOUS | Qty: 40 | Status: AC

## 2016-12-16 MED FILL — Heparin Sodium (Porcine) Inj 1000 Unit/ML: INTRAMUSCULAR | Qty: 10 | Status: AC

## 2016-12-16 MED FILL — Sodium Bicarbonate IV Soln 8.4%: INTRAVENOUS | Qty: 100 | Status: AC

## 2016-12-16 MED FILL — Sodium Chloride IV Soln 0.9%: INTRAVENOUS | Qty: 2000 | Status: AC

## 2016-12-16 MED FILL — Calcium Chloride Inj 10%: INTRAVENOUS | Qty: 10 | Status: AC

## 2016-12-16 MED FILL — Electrolyte-R (PH 7.4) Solution: INTRAVENOUS | Qty: 3000 | Status: AC

## 2016-12-16 MED FILL — Mannitol IV Soln 20%: INTRAVENOUS | Qty: 500 | Status: AC

## 2016-12-16 MED FILL — Magnesium Sulfate Inj 50%: INTRAMUSCULAR | Qty: 10 | Status: AC

## 2016-12-16 MED FILL — Lidocaine HCl IV Inj 20 MG/ML: INTRAVENOUS | Qty: 5 | Status: AC

## 2016-12-16 NOTE — Progress Notes (Signed)
Patient ID: Emily Rocha, female   DOB: [birth year], 61 y.o.   MRN: [id number] [facility] Evening Rounds:  Hemodynamically stable on epi 4, milrinone 0.125, levo 3. Vasopressin is off.  Receiving a unit of blood for Hgb of 7.7 followed by Lasix.  CBC    Component Value Date/Time   WBC 13.1 (H) 12/16/2016 1607   RBC 2.89 (L) 12/16/2016 1607   HGB 7.7 (L) 12/16/2016 1607   HCT 23.6 (L) 12/16/2016 1607   PLT 150 12/16/2016 1607   MCV 81.7 12/16/2016 1607   MCH 26.6 12/16/2016 1607   MCHC 32.6 12/16/2016 1607   RDW 20.5 (H) 12/16/2016 1607   LYMPHSABS 1.7 12/12/2016 1803   MONOABS 0.6 12/12/2016 1803   EOSABS 0.1 12/12/2016 1803   BASOSABS 0.0 12/12/2016 1803   BMET    Component Value Date/Time   NA 127 (L) 12/16/2016 1556   K 4.1 12/16/2016 1556   CL 94 (L) 12/16/2016 1556   CO2 23 12/16/2016 0253   GLUCOSE 132 (H) 12/16/2016 1556   BUN 18 12/16/2016 1556   CREATININE 0.70 12/16/2016 1556   CALCIUM 8.2 (L) 12/16/2016 0253   GFRNONAA >60 12/16/2016 0253   GFRAA >60 12/16/2016 0253    Was up in chair twice today. May need bipap again tonight

## 2016-12-16 NOTE — Progress Notes (Signed)
2 Days Post-Op Procedure(s) (LRB): CORONARY ARTERY BYPASS GRAFTING (CABG) x three , using left internal mammary artery and right leg greater saphenous vein harvested endoscopically (N/A) TRANSESOPHAGEAL ECHOCARDIOGRAM (TEE) (N/A) Subjective: Progressing after emerg CABG Cardiac index improved Maintaining nsr Still volume overloaded on iv lasix Objective: Vital signs in last 24 hours: Temp:  [98.6 F (37 C)-100.6 F (38.1 C)] 99.5 F (37.5 C) (11/01 0833) Pulse Rate:  [72-105] 94 (11/01 0739) Cardiac Rhythm: Normal sinus rhythm (11/01 0400) Resp:  [11-35] 20 (11/01 0739) BP: (84-129)/(49-73) 114/53 (11/01 0739) SpO2:  [91 %-100 %] 98 % (11/01 0739) Arterial Line BP: (99-135)/(46-62) 109/53 (11/01 0700) FiO2 (%):  [30 %-100 %] 30 % (11/01 0400) Weight:  [208 lb 3.2 oz (94.4 kg)] 208 lb 3.2 oz (94.4 kg) (11/01 0445)  Hemodynamic parameters for last 24 hours: PAP: (24-40)/(16-25) 33/25 CO:  [3.5 L/min-5.1 L/min] 3.5 L/min CI:  [1.8 L/min/m2-2.7 L/min/m2] 1.8 L/min/m2  Intake/Output from previous day: 10/31 0701 - 11/01 0700 In: 3655.6 [I.V.:3605.6; IV Piggyback:50] Out: 1510 [Urine:930; Chest Tube:580] Intake/Output this shift: No intake/output data recorded.       Exam    General- alert and comfortable   Lungs- clear without rales, wheezes   Cor- regular rate and rhythm, no murmur , gallop   Abdomen- soft, non-tender   Extremities - warm, non-tender, minimal edema   Neuro- oriented, appropriate, no focal weakness   Lab Results:  Recent Labs  12/15/16 0312 12/15/16 1603 12/16/16 0253  WBC 10.1  --  11.6*  HGB 8.7* 8.5* 8.3*  HCT 26.4* 25.0* 25.6*  PLT 208  --  164   BMET:  Recent Labs  12/15/16 0328 12/15/16 1603 12/16/16 0253  NA 134* 132* 129*  K 3.9 4.3 4.6  CL 104 98* 98*  CO2 24  --  23  GLUCOSE 160* 166* 196*  BUN 15 15 14   CREATININE 0.73 0.70 0.82  CALCIUM 8.7*  --  8.2*    PT/INR:  Recent Labs  12/14/16 2136  LABPROT 17.1*  INR 1.40    ABG    Component Value Date/Time   PHART 7.421 12/16/2016 0306   HCO3 26.7 12/16/2016 0306   TCO2 28 12/16/2016 0306   O2SAT 66.6 12/16/2016 0319   CBG (last 3)   Recent Labs  12/16/16 0004 12/16/16 0350 12/16/16 0825  GLUCAP 193* 187* 201*    Assessment/Plan: S/P Procedure(s) (LRB): CORONARY ARTERY BYPASS GRAFTING (CABG) x three , using left internal mammary artery and right leg greater saphenous vein harvested endoscopically (N/A) TRANSESOPHAGEAL ECHOCARDIOGRAM (TEE) (N/A) Mobilize Diuresis Diabetes control d/c tubes/lines wean off vasopressin  Cont iv amiodarone today   LOS: 4 days    Emily Rocha 12/16/2016

## 2016-12-16 NOTE — Progress Notes (Signed)
Peripherally Inserted Central Catheter/Midline Placement  The IV Nurse has discussed with the patient and/or persons authorized to consent for the patient, the purpose of this procedure and the potential benefits and risks involved with this procedure.  The benefits include less needle sticks, lab draws from the catheter, and the patient may be discharged home with the catheter. Risks include, but not limited to, infection, bleeding, blood clot (thrombus formation), and puncture of an artery; nerve damage and irregular heartbeat and possibility to perform a PICC exchange if needed/ordered by physician.  Alternatives to this procedure were also discussed.  Bard Power PICC patient education guide, fact sheet on infection prevention and patient information card has been provided to patient /or left at bedside.    PICC/Midline Placement Documentation        Emily Rocha 12/16/2016, 12:32 PM

## 2016-12-16 NOTE — Progress Notes (Signed)
Progress Note   Patient Name: Emily Rocha Date of Encounter: 12/16/2016  Primary Cardiologist:   Dr. Harl Bowie  Subjective   She has some chest soreness but no SOB.  Wants to eat  Inpatient Medications    Scheduled Meds: . acetaminophen  1,000 mg Oral Q6H   Or  . acetaminophen (TYLENOL) oral liquid 160 mg/5 mL  1,000 mg Per Tube Q6H  . aspirin EC  325 mg Oral Daily   Or  . aspirin  324 mg Per Tube Daily  . atorvastatin  40 mg Oral Daily  . bisacodyl  10 mg Oral Daily   Or  . bisacodyl  10 mg Rectal Daily  . docusate sodium  200 mg Oral Daily  . enoxaparin (LOVENOX) injection  40 mg Subcutaneous Q24H  . furosemide  40 mg Intravenous BID  . [START ON 12/17/2016] Influenza vac split quadrivalent PF  0.5 mL Intramuscular Tomorrow-1000  . insulin aspart  0-24 Units Subcutaneous Q4H  . insulin detemir  20 Units Subcutaneous BID  . ipratropium  0.5 mg Nebulization TID  . levalbuterol  1.25 mg Nebulization TID  . mouth rinse  15 mL Mouth Rinse BID  . metoCLOPramide (REGLAN) injection  10 mg Intravenous Q6H  . metolazone  5 mg Oral Once  . metoprolol tartrate  12.5 mg Oral BID   Or  . metoprolol tartrate  12.5 mg Per Tube BID  . mometasone-formoterol  2 puff Inhalation BID  . pantoprazole  40 mg Oral Daily  . sodium chloride flush  3 mL Intravenous Q12H   Continuous Infusions: . sodium chloride 20 mL/hr at 12/16/16 0700  . sodium chloride    . sodium chloride 20 mL/hr at 12/16/16 0700  . amiodarone 30 mg/hr (12/16/16 0900)  . cefTRIAXone (ROCEPHIN)  IV Stopped (12/15/16 2144)  . EPINEPHrine 4 mg in dextrose 5% 250 mL infusion (16 mcg/mL) 4 mcg/min (12/16/16 0700)  . lactated ringers 20 mL/hr at 12/16/16 0700  . lactated ringers 20 mL (12/14/16 2203)  . milrinone 0.125 mcg/kg/min (12/16/16 0700)  . norepinephrine (LEVOPHED) Adult infusion 4 mcg/min (12/16/16 0700)  . phenylephrine (NEO-SYNEPHRINE) Adult infusion Stopped (12/15/16 1139)  . vasopressin (PITRESSIN) infusion  - *FOR SHOCK* 0.02 Units/min (12/16/16 0700)   PRN Meds: sodium chloride, fentaNYL (SUBLIMAZE) injection, levalbuterol, metoprolol tartrate, midazolam, morphine injection, ondansetron (ZOFRAN) IV, oxyCODONE **OR** oxyCODONE, oxyCODONE, sodium chloride flush, traMADol   Vital Signs    Vitals:   12/16/16 0645 12/16/16 0700 12/16/16 0739 12/16/16 0833  BP:  98/73 (!) 114/53   Pulse: 94 92 94   Resp: (!) 25 (!) 23 20   Temp: 99.5 F (37.5 C) 99.3 F (37.4 C)  99.5 F (37.5 C)  TempSrc:    Core (Comment)  SpO2: 100% 100% 98%   Weight:      Height:        Intake/Output Summary (Last 24 hours) at 12/16/16 0847 Last data filed at 12/16/16 0700  Gross per 24 hour  Intake          3471.15 ml  Output             1415 ml  Net          2056.15 ml   Filed Weights   12/13/16 0649 12/15/16 0430 12/16/16 0445  Weight: 185 lb 10 oz (84.2 kg) 201 lb 4.5 oz (91.3 kg) 208 lb 3.2 oz (94.4 kg)    Telemetry    NSR, atrial pacing, PACs - Personally  Reviewed  ECG    NA - Personally Reviewed  Physical Exam   GENERAL:  No acute distress NECK:  No jugular venous distention, waveform within normal limits LUNGS:  Clear to auscultation bilaterally CHEST:  Sternal wound dressed  HEART:  PMI not displaced or sustained,S1 and S2 within normal limits, no S3, no S4, no clicks, no rubs, no murmurs  (distant heart sounds) ABD:  Flat, positive bowel sounds decreased in frequency in pitch, no bruits, no rebound, no guarding, no midline pulsatile mass, no hepatomegaly, no splenomegaly EXT:  2 plus pulses throughout, diffuse edema, no cyanosis no clubbing   Labs    Chemistry Recent Labs Lab 12/12/16 1857  12/14/16 0452  12/15/16 0328 12/15/16 1603 12/16/16 0253  NA 138  < > 135  < > 134* 132* 129*  K 3.7  < > 4.1  < > 3.9 4.3 4.6  CL 100*  < > 96*  < > 104 98* 98*  CO2 24  < > 28  --  24  --  23  GLUCOSE 115*  < > 109*  < > 160* 166* 196*  BUN 11  < > 16  < > 15 15 14   CREATININE 0.58  < >  0.83  < > 0.73 0.70 0.82  CALCIUM 9.3  < > 9.0  --  8.7*  --  8.2*  PROT 7.7  --   --   --   --   --   --   ALBUMIN 4.1  --   --   --   --   --   --   AST 53*  --   --   --   --   --   --   ALT 110*  --   --   --   --   --   --   ALKPHOS 101  --   --   --   --   --   --   BILITOT 1.1  --   --   --   --   --   --   GFRNONAA >60  < > >60  --  >60  --  >60  GFRAA >60  < > >60  --  >60  --  >60  ANIONGAP 14  < > 11  --  6  --  8  < > = values in this interval not displayed.   Hematology  Recent Labs Lab 12/14/16 2136 12/15/16 0312 12/15/16 1603 12/16/16 0253  WBC 11.3* 10.1  --  11.6*  RBC 3.34* 3.22*  --  3.13*  HGB 9.0* 8.7* 8.5* 8.3*  HCT 27.5* 26.4* 25.0* 25.6*  MCV 82.3 82.0  --  81.8  MCH 26.9 27.0  --  26.5  MCHC 32.7 33.0  --  32.4  RDW 21.5* 20.7*  --  20.8*  PLT 196 208  --  164    Cardiac Enzymes  Recent Labs Lab 12/12/16 1856 12/12/16 2301 12/13/16 0554  TROPONINI 2.20* 2.15* 2.02*     Recent Labs Lab 12/12/16 1812  TROPIPOC 1.60*     BNP  Recent Labs Lab 12/12/16 1857  BNP 458.0*     DDimer No results for input(s): DDIMER in the last 168 hours.   Radiology    Dg Chest Port 1 View  Result Date: 12/15/2016 CLINICAL DATA:  Chest tube, follow-up CABG EXAM: PORTABLE CHEST 1 VIEW COMPARISON:  12/14/2016 FINDINGS: Support devices are stable. No pneumothorax. Cardiomegaly with  bilateral interstitial and alveolar opacities, increasing in the left base since prior study. Otherwise no change. IMPRESSION: Diffuse bilateral interstitial and alveolar opacities, slightly worsening in the left base. No pneumothorax. Stable support devices. Electronically Signed   By: Rolm Baptise M.D.   On: 12/15/2016 08:24   Dg Chest Port 1 View  Result Date: 12/14/2016 CLINICAL DATA:  Status post CABG x3. EXAM: PORTABLE CHEST 1 VIEW COMPARISON:  12/12/2016. FINDINGS: Cardiomegaly, median sternotomy, and post CABG sequelae. Swan-Ganz catheter tip RIGHT main pulmonary  artery. BILATERAL chest tubes and mediastinal tubes in good position, no pneumothorax. ETT exact localization obscured by overlying support apparatus, estimated 3.6 cm above carina. Enteric tube lies within the stomach. Worsening RIGHT mid and lower lung zone opacities, compared to the preoperative radiograph, could represent developing pneumonia or asymmetric edema. No significant pleural effusion. IMPRESSION: Post CABG support apparatus appears satisfactory position. No pneumothorax. Worsening aeration, with increasing opacity in RIGHT middle and lower lung zones. Query pneumonia or asymmetric edema. Electronically Signed   By: Staci Righter M.D.   On: 12/14/2016 21:59    Cardiac Studies   ECHO:  12/13/16     - Left ventricle: Septal and inferior basal hypokinesis. Systolic   function was mildly reduced. The estimated ejection fraction was   in the range of 45% to 50%. Doppler parameters are consistent   with both elevated ventricular end-diastolic filling pressure and   elevated left atrial filling pressure. - Aortic valve: There was mild stenosis. Valve area (VTI): 1.56   cm^2. Valve area (Vmax): 1.54 cm^2. Valve area (Vmean): 1.51   cm^2. - Mitral valve: There was mild to moderate regurgitation. Valve   area by continuity equation (using LVOT flow): 2.73 cm^2. - Left atrium: The atrium was mildly dilated. - Atrial septum: No defect or patent foramen ovale was identified. - Pulmonary arteries: PA peak pressure: 47 mm Hg (S).    Cardiac Cath:    Conclusion     Mid RCA lesion, 60 %stenosed.  Prox RCA lesion, 40 %stenosed.  Ost RPDA to RPDA lesion, 90 %stenosed.  Ost Cx to Prox Cx lesion, 100 %stenosed.  Mid LAD lesion, 80 %stenosed.  Ost 2nd Diag to 2nd Diag lesion, 40 %stenosed.  Prox LAD to Mid LAD lesion, 40 %stenosed.  Ost LM to LM lesion, 99 %stenosed.   1. Severe distal left main stenosis 2. Severe mid LAD stenosis 3. Chronic total occlusion of the ostial  Circumflex artery which fills from right to left collaterals.  4. Moderately severe, calcified stenosis of the mid RCA 5. Elevated filling pressures.        Patient Profile     61 y.o. female with a hx of carotid artery stenosis s/p right CEA (12/06/14), former smoker, COPD, GERD, HLD, and PAD who is being seen for the evaluation of elevated troponin at the request of Dr. Thereasa Solo.  Assessment & Plan    NSTEMI:   Extubated yesterday.   Critical left main.  Status post CABG.  Extubated yesterday.   Weaning pressors as able.  Diuresis today.  Up 4 liters total.  COPD:  Continue breathing treatments   DYSLIPIDEMIA:   Increase to Lipitor 80.   CARDIOMYOPATHY:    Continue hemodynamic support.    ATRIAL FIB:  PAF.  Continue IV amio today.   Signed, Minus Breeding, MD  12/16/2016, 8:47 AM  '

## 2016-12-16 NOTE — Op Note (Signed)
NAME:  Emily Rocha, Emily Rocha                       ACCOUNT NO.:  MEDICAL RECORD NO.:  [id number]  LOCATION:                                 FACILITY:  PHYSICIAN:  Ivin Poot, M.D.  DATE OF BIRTH:  [birth year]  DATE OF PROCEDURE:  12/14/2016 DATE OF DISCHARGE:                              OPERATIVE REPORT   OPERATIONS: 1. Emergency coronary artery bypass grafting x3 (left internal mammary     artery to LAD, saphenous vein graft to circumflex marginal,     saphenous vein graft to diagonal). 2. Endoscopic harvest of right leg greater saphenous vein. 3. Exploration of left leg greater saphenous vein, but not harvested     due to inadequate size.  PREOPERATIVE DIAGNOSES:  Non-ST-elevation myocardial infarction, severe 3-vessel coronary artery disease with 95% left main stenosis, ischemic cardiomyopathy with cardiogenic shock and LVEDP 40 mmHg with moderate mitral regurgitation.  POSTOPERATIVE DIAGNOSES:  Non-ST-elevation myocardial infarction, severe 3-vessel coronary artery disease with 95% left main stenosis, ischemic cardiomyopathy with cardiogenic shock and LVEDP 40 mmHg with moderate mitral regurgitation.  SURGEON:  Ivin Poot, MD.  ASSISTANTEllwood Handler, PA-C.  ANESTHESIA:  General by Dr. Oleta Mouse and Oren Bracket, MD.  CLINICAL NOTE:  The patient is a 61 year old obese female who was transferred from outside facility with positive cardiac enzymes and non- ST-elevation MI.  She had evidence of heart failure with pleural effusions, pulmonary interstitial edema, and low blood pressure. Echocardiogram showed moderate to severe LV dysfunction with mild- moderate mitral regurgitation.  She underwent cardiac catheterization, which showed no significant disease of a dominant right coronary artery, but with a 95% stenosis of the left main with small distal targets in the LAD and circumflex distribution.  Emergency cardiac surgical evaluation was requested after  the cardiac catheterization and I examined the patient in the cardiac cath lab area and discussed her condition with her cardiologist, Dr. Angelena Form for coordination of care. It was the Cardiology's recommendation that the patient undergo emergency coronary artery bypass surgery.  I was concerned with the patient's condition with elevated filling pressures and evidence of cardiogenic shock, but felt that emergency CABG would be her best long- term therapy and best chance for survival.  I discussed the procedure of CABG with the patient and her family including indications, benefits, and risks including those risks of stroke, bleeding requiring blood transfusion, postoperative pulmonary problems including pleural effusion, postoperative infection, postoperative MI and organ failure, and postoperative infection and death.  The patient understood these issues and agreed to proceed with surgery under what I felt was an informed consent.  OPERATIVE FINDINGS: 1. Difficult vein conduit, but 2 segments found for the graft to the     diagonal and OM. 2. Small and difficult coronary targets, intramyocardial under a deep     layer of epicardial fat and muscle. 3. Intraoperative anemia requiring transfusion of blood products. 4. Improved global LV function after placement of bypass grafts and     separation from cardiopulmonary bypass.  DESCRIPTION OF PROCEDURE:  The patient was brought to the operating room directly from the cath lab and placed supine on  the operating table. General anesthesia was induced under invasive hemodynamic monitoring.  A transesophageal echo probe was placed by the anesthesia team.  The chest, abdomen, and legs were prepped and draped as a sterile field.  A proper time-out was performed.  A sternal incision was made as the saphenous vein was harvested endoscopically from the right leg.  The left leg was exposed, but the vein was inadequate for use.  The internal  mammary artery was a small 1.2-mm vessel, but it had excellent flow.  Harvest was tedious and difficult due to the patient's short, obese body habitus.  The sternal retractor was then placed using the blades due to the patient's obese, short body habitus.  The pericardium was opened and suspended.  Pursestrings were placed in ascending aorta and right atrium, and after heparin was administered and the ACT was documented as being therapeutic, the patient was cannulated and placed on cardiopulmonary bypass.  The coronaries were identified for grafting and the mammary artery and vein grafts were prepared for the distal coronary anastomoses.  The patient was cooled to 32 degrees and aortic crossclamp was applied.  Cardioplegia cannulas had been placed for both antegrade aortic and retrograde coronary sinus cardioplegia.  The patient was given 1 L of cold blood cardioplegia and delivered in split doses between the antegrade aortic and retrograde coronary sinus catheters.  There was good cardioplegic arrest and septal temperature dropped less than 12 degrees.  Cardioplegia was delivered every 20 minutes.  The distal coronary anastomoses were performed.  The first distal anastomosis was to the OM branch of the left circumflex.  There was a small 1-mm vessel with a proximal 95% left main stenosis.  A reversed saphenous vein was sewn end-to-side with running 7-0 Prolene with good flow through the graft.  Cardioplegia was redosed.  The second distal anastomosis was to the first diagonal branch to the LAD.  There was a proximal ostial 80% stenosis as well as a proximal 95% left main stenosis.  A reversed saphenous vein was sewn end-to-side to this 1.2-mm vessel with good flow through the graft.  Cardioplegia was redosed.  The third distal anastomosis was to the distal third of the LAD.  It was intramyocardial.  It was a small 1.3-4 mm vessel.  The left IMA pedicle was brought through an  opening in the left lateral pericardium, which was brought down onto the LAD and sewn end-to-side with running 8-0 Prolene.  There was good flow through the anastomosis after briefly releasing the pedicle bulldog on the mammary artery.  The bulldog was reapplied and the pedicle secured to the epicardium with 6-0 Prolenes. Cardioplegia was redosed.  While the crossclamp was still in place, 2 proximal vein anastomoses were performed on the ascending aorta using a 4.0-mm punch running 6-0 Prolene.  Prior to tying the final proximal anastomosis, air was vented from the coronaries with a dose of retrograde warm blood cardioplegia. The crossclamp was removed.  The vein grafts were de-aired and opened and each had good flow. Hemostasis was documented at the proximal and distal anastomoses.  The heart resumed a spontaneous rhythm.  The patient was rewarmed and reperfused.  Temporary pacing wires were applied.  The lungs were expanded and the ventilator was resumed.  The patient was then started on low-dose inotropes and atrial pacing and was weaned off cardiopulmonary bypass at the first attempt.  Echo showed improved global LV function.  Blood pressure was satisfactory and cardiac output satisfactory.  Mitral regurgitation was  present, but improved.  The patient was given protamine to reverse the heparin without adverse affects.  The mediastinum was irrigated.  The superior pericardial fat was closed over the aorta.  Anterior mediastinal and left pleural chest tubes and right pleural chest tubes were placed after draining significant bilateral pleural effusions.  The sternum was closed with wire.  The pectoralis fascia was closed with a running #1 Vicryl.  The subcutaneous and skin layers were closed with running Vicryl and sterile dressings were applied.  Total cardiopulmonary bypass time was 112 minutes.     Ivin Poot, M.D.     PV/MEDQ  D:  12/15/2016  T:  12/15/2016  Job:   [id number]  cc:   Minus Breeding, MD, [facility]

## 2016-12-17 ENCOUNTER — Inpatient Hospital Stay (HOSPITAL_COMMUNITY): Payer: BLUE CROSS/BLUE SHIELD

## 2016-12-17 LAB — GLUCOSE, CAPILLARY
GLUCOSE-CAPILLARY: 149 mg/dL — AB (ref 65–99)
GLUCOSE-CAPILLARY: 99 mg/dL (ref 65–99)
GLUCOSE-CAPILLARY: 77 mg/dL (ref 65–99)
GLUCOSE-CAPILLARY: 78 mg/dL (ref 65–99)
Glucose-Capillary: 108 mg/dL — ABNORMAL HIGH (ref 65–99)
Glucose-Capillary: 111 mg/dL — ABNORMAL HIGH (ref 65–99)
Glucose-Capillary: 126 mg/dL — ABNORMAL HIGH (ref 65–99)
Glucose-Capillary: 140 mg/dL — ABNORMAL HIGH (ref 65–99)
Glucose-Capillary: 68 mg/dL (ref 65–99)

## 2016-12-17 LAB — COMPREHENSIVE METABOLIC PANEL
ALT: 240 U/L — ABNORMAL HIGH (ref 14–54)
AST: 103 U/L — ABNORMAL HIGH (ref 15–41)
Albumin: 2.6 g/dL — ABNORMAL LOW (ref 3.5–5.0)
Alkaline Phosphatase: 66 U/L (ref 38–126)
Anion gap: 8 (ref 5–15)
BUN: 15 mg/dL (ref 6–20)
CO2: 28 mmol/L (ref 22–32)
Calcium: 8.1 mg/dL — ABNORMAL LOW (ref 8.9–10.3)
Chloride: 89 mmol/L — ABNORMAL LOW (ref 101–111)
Creatinine, Ser: 0.66 mg/dL (ref 0.44–1.00)
GFR calc Af Amer: >60 mL/min (ref >60)
GFR calc non Af Amer: >60 mL/min (ref >60)
Glucose, Bld: 238 mg/dL — ABNORMAL HIGH (ref 65–99)
Potassium: 3.5 mmol/L (ref 3.5–5.1)
Sodium: 125 mmol/L — ABNORMAL LOW (ref 135–145)
Total Bilirubin: 1 mg/dL (ref 0.3–1.2)
Total Protein: 4.6 g/dL — ABNORMAL LOW (ref 6.5–8.1)

## 2016-12-17 LAB — POCT I-STAT, CHEM 8
BUN: 18 mg/dL (ref 6–20)
CHLORIDE: 90 mmol/L — AB (ref 101–111)
CREATININE: 0.7 mg/dL (ref 0.44–1.00)
Calcium, Ion: 1.22 mmol/L (ref 1.15–1.40)
GLUCOSE: 76 mg/dL (ref 65–99)
HCT: 29 % — ABNORMAL LOW (ref 36.0–46.0)
Hemoglobin: 9.9 g/dL — ABNORMAL LOW (ref 12.0–15.0)
POTASSIUM: 4.3 mmol/L (ref 3.5–5.1)
Sodium: 129 mmol/L — ABNORMAL LOW (ref 135–145)
TCO2: 31 mmol/L (ref 22–32)

## 2016-12-17 LAB — CBC
HCT: 25.7 % — ABNORMAL LOW (ref 36.0–46.0)
Hemoglobin: 8.6 g/dL — ABNORMAL LOW (ref 12.0–15.0)
MCH: 27.5 pg (ref 26.0–34.0)
MCHC: 33.5 g/dL (ref 30.0–36.0)
MCV: 82.1 fL (ref 78.0–100.0)
Platelets: 140 10*3/uL — ABNORMAL LOW (ref 150–400)
RBC: 3.13 MIL/uL — ABNORMAL LOW (ref 3.87–5.11)
RDW: 19.3 % — ABNORMAL HIGH (ref 11.5–15.5)
WBC: 12.2 10*3/uL — ABNORMAL HIGH (ref 4.0–10.5)

## 2016-12-17 LAB — CULTURE, BLOOD (ROUTINE X 2)
CULTURE: NO GROWTH
Culture: NO GROWTH
Special Requests: ADEQUATE
Special Requests: ADEQUATE

## 2016-12-17 LAB — COOXEMETRY PANEL
Carboxyhemoglobin: 1.2 % (ref 0.5–1.5)
Methemoglobin: 1.2 % (ref 0.0–1.5)
O2 Saturation: 64.7 %
Total hemoglobin: 8.6 g/dL — ABNORMAL LOW (ref 12.0–16.0)

## 2016-12-17 MED ORDER — INFLUENZA VAC SPLIT HIGH-DOSE 0.5 ML IM SUSY: 0.5000 mL | PREFILLED_SYRINGE | INTRAMUSCULAR | Status: DC | PRN

## 2016-12-17 MED ORDER — AMIODARONE HCL 200 MG PO TABS
400.0000 mg | ORAL_TABLET | Freq: Two times a day (BID) | ORAL | Status: DC
  Administered 2016-12-17 – 2016-12-18 (×3): 400 mg via ORAL
  Filled 2016-12-17 (×4): qty 2

## 2016-12-17 MED ORDER — CARVEDILOL 6.25 MG PO TABS
6.2500 mg | ORAL_TABLET | Freq: Two times a day (BID) | ORAL | Status: DC
  Administered 2016-12-17 – 2016-12-18 (×2): 6.25 mg via ORAL
  Filled 2016-12-17 (×3): qty 1

## 2016-12-17 MED ORDER — INSULIN ASPART 100 UNIT/ML ~~LOC~~ SOLN
0.0000 [IU] | Freq: Three times a day (TID) | SUBCUTANEOUS | Status: DC
  Administered 2016-12-19: 4 [IU] via SUBCUTANEOUS
  Administered 2016-12-19 (×2): 2 [IU] via SUBCUTANEOUS

## 2016-12-17 MED ORDER — ASPIRIN EC 81 MG PO TBEC
81.0000 mg | DELAYED_RELEASE_TABLET | Freq: Every day | ORAL | Status: DC
  Administered 2016-12-17 – 2016-12-27 (×11): 81 mg via ORAL
  Filled 2016-12-17 (×11): qty 1

## 2016-12-17 MED ORDER — POTASSIUM CHLORIDE 10 MEQ/50ML IV SOLN
10.0000 meq | INTRAVENOUS | Status: AC
  Administered 2016-12-17 (×3): 10 meq via INTRAVENOUS
  Filled 2016-12-17 (×3): qty 50

## 2016-12-17 MED ORDER — INSULIN ASPART 100 UNIT/ML ~~LOC~~ SOLN: 0.0000 [IU] | SUBCUTANEOUS | Status: DC

## 2016-12-17 MED ORDER — INSULIN DETEMIR 100 UNIT/ML ~~LOC~~ SOLN
25.0000 [IU] | Freq: Two times a day (BID) | SUBCUTANEOUS | Status: DC
  Administered 2016-12-17: 25 [IU] via SUBCUTANEOUS
  Filled 2016-12-17 (×3): qty 0.25

## 2016-12-17 MED ORDER — AMIODARONE HCL 200 MG PO TABS: 400.0000 mg | ORAL_TABLET | Freq: Two times a day (BID) | ORAL | Status: DC

## 2016-12-17 MED ORDER — POTASSIUM CHLORIDE CRYS ER 10 MEQ PO TBCR
20.0000 meq | EXTENDED_RELEASE_TABLET | Freq: Every day | ORAL | Status: DC
  Administered 2016-12-17: 20 meq via ORAL
  Filled 2016-12-17 (×2): qty 2

## 2016-12-17 MED ORDER — METOLAZONE 5 MG PO TABS
5.0000 mg | ORAL_TABLET | Freq: Once | ORAL | Status: AC
  Administered 2016-12-17: 5 mg via ORAL
  Filled 2016-12-17: qty 1

## 2016-12-17 MED ORDER — FE FUMARATE-B12-VIT C-FA-IFC PO CAPS
1.0000 | ORAL_CAPSULE | Freq: Three times a day (TID) | ORAL | Status: DC
  Administered 2016-12-17 – 2016-12-27 (×32): 1 via ORAL
  Filled 2016-12-17 (×32): qty 1

## 2016-12-17 MED ORDER — CLOPIDOGREL BISULFATE 75 MG PO TABS
75.0000 mg | ORAL_TABLET | Freq: Every day | ORAL | Status: DC
  Administered 2016-12-17 – 2016-12-27 (×11): 75 mg via ORAL
  Filled 2016-12-17 (×11): qty 1

## 2016-12-17 NOTE — Progress Notes (Signed)
3 Days Post-Op Procedure(s) (LRB): CORONARY ARTERY BYPASS GRAFTING (CABG) x three , using left internal mammary artery and right leg greater saphenous vein harvested endoscopically (N/A) TRANSESOPHAGEAL ECHOCARDIOGRAM (TEE) (N/A) Subjective: Continues to progress after emergency CABG for ischemic cardiomyopathy with class IV heart failure and preoperative cardiogenic shock  Maintaining sinus rhythm Weaning inotropes, currently on low-dose milrinone Will transition from metoprolol to carvedilol due to low EF Patient remains 15 pounds over preop weight-continue twice daily IV Lasix Patient had bilateral significant pleural effusions and bilateral pleural chest tubes continue to drain  Patient improving strength and is able to walk now to the hallway Objective: Vital signs in last 24 hours: Temp:  [97.7 F (36.5 C)-98.7 F (37.1 C)] 97.7 F (36.5 C) (11/02 1307) Pulse Rate:  [76-101] 93 (11/02 1700) Cardiac Rhythm: Normal sinus rhythm (11/02 1600) Resp:  [11-37] 20 (11/02 1700) BP: (82-134)/(52-101) 107/70 (11/02 1700) SpO2:  [91 %-100 %] 96 % (11/02 1700) Weight:  [208 lb 8.9 oz (94.6 kg)] 208 lb 8.9 oz (94.6 kg) (11/02 0530)  Hemodynamic parameters for last 24 hours:    Intake/Output from previous day: 11/01 0701 - 11/02 0700 In: 2514.7 [I.V.:2049.7; Blood:315; IV Piggyback:150] Out: 2735 [Urine:2165; Chest Tube:570] Intake/Output this shift: Total I/O In: 1230.9 [P.O.:900; I.V.:258.9; Other:22; IV Piggyback:50] Out: 5726 [Urine:1300; Chest Tube:60]       Exam    General- alert and comfortable   Lungs- clear without rales, wheezes   Cor- regular rate and rhythm, no murmur , gallop   Abdomen- soft, non-tender   Extremities - warm, non-tender, minimal edema   Neuro- oriented, appropriate, no focal weakness   Lab Results:  Recent Labs  12/16/16 1607 12/17/16 0242 12/17/16 1659  WBC 13.1* 12.2*  --   HGB 7.7* 8.6* 9.9*  HCT 23.6* 25.7* 29.0*  PLT 150 140*  --     BMET:  Recent Labs  12/16/16 0253  12/17/16 0242 12/17/16 1659  NA 129*  < > 125* 129*  K 4.6  < > 3.5 4.3  CL 98*  < > 89* 90*  CO2 23  --  28  --   GLUCOSE 196*  < > 238* 76  BUN 14  < > 15 18  CREATININE 0.82  < > 0.66 0.70  CALCIUM 8.2*  --  8.1*  --   < > = values in this interval not displayed.  PT/INR:  Recent Labs  12/14/16 2136  LABPROT 17.1*  INR 1.40   ABG    Component Value Date/Time   PHART 7.421 12/16/2016 0306   HCO3 26.7 12/16/2016 0306   TCO2 31 12/17/2016 1659   O2SAT 64.7 12/17/2016 0306   CBG (last 3)   Recent Labs  12/17/16 0813 12/17/16 1209 12/17/16 1309  GLUCAP 99 140* 77    Assessment/Plan: S/P Procedure(s) (LRB): CORONARY ARTERY BYPASS GRAFTING (CABG) x three , using left internal mammary artery and right leg greater saphenous vein harvested endoscopically (N/A) TRANSESOPHAGEAL ECHOCARDIOGRAM (TEE) (N/A) Mobilize Diuresis Diabetes control Continue milrinone and check mixed venous saturation in a.m.   LOS: 5 days    Emily Rocha 12/17/2016

## 2016-12-17 NOTE — Discharge Summary (Signed)
Physician Discharge Summary  Patient ID: Emily Rocha MRN: [id number] DOB/AGE: 05/05/1955 61 y.o.  Admit date: 12/12/2016 Discharge date: 12/27/2016  Admission Diagnoses: Patient Active Problem List   Diagnosis Date Noted  . Chest pain 12/12/2016  . NSTEMI (non-ST elevated myocardial infarction) ([place]) 12/12/2016  . COPD with acute exacerbation ([place]) 12/12/2016  . GERD (gastroesophageal reflux disease) 12/12/2016  . CAP (community acquired pneumonia) 12/12/2016  . Abdominal pain, epigastric 11/30/2016  . Absolute anemia 11/30/2016  . Carotid stenosis 12/19/2014  . Aftercare following surgery of the circulatory system 12/19/2014  . Carotid artery stenosis, symptomatic 12/06/2014  . Symptomatic carotid artery stenosis 12/04/2014   Discharge Diagnoses:   Patient Active Problem List   Diagnosis Date Noted  . Acute diastolic heart failure ([place])   . Unstable angina ([place]) 12/14/2016  . S/P CABG x 3 12/14/2016  . Chest pain 12/12/2016  . NSTEMI (non-ST elevated myocardial infarction) ([place]) 12/12/2016  . COPD with acute exacerbation ([place]) 12/12/2016  . GERD (gastroesophageal reflux disease) 12/12/2016  . CAP (community acquired pneumonia) 12/12/2016  . Abdominal pain, epigastric 11/30/2016  . Absolute anemia 11/30/2016  . Carotid stenosis 12/19/2014  . Aftercare following surgery of the circulatory system 12/19/2014  . Carotid artery stenosis, symptomatic 12/06/2014  . Symptomatic carotid artery stenosis 12/04/2014   Discharged Condition: good  History of Present Illness:  [name] is a 61 yo obese white female with known history of COPD, asthma, and GERD.  She presented to the ED on 12/12/2016 with complaints of chest pain.  She states this has been occurring off and on over the past 3 months.  However, this has progressed over the past 3 days and is associated with shortness of breath and diaphoresis.  Workup in the ED showed the patient to be afebrile, hypoxic with oxygen level  of 84% of RA.  Her BNP was mildly elevated at 458.  Troponin level was positive at 2.20.  CXR obtained showed mild CHF and emphysema.  She was placed on oxygen and given IV Lasix.  She received breathing treatments.  She was started on Heparin and admitted to the medicine service and Cardiology consult was obtained.      Hospital Course:   She remained chest pain free during hospitalization.  She continued to be short of breath despite interventions.  She was ruled in for NSTEMI and Cardiology felt patient should undergo cardiac catheterization.  This was done on 12/14/2016 and showed severe 2 vessel CAD with LM involvement.  It was felt emergent bypass surgery would be indicated.  TCTS consult was obtained and she was taken emergently to the operating room by Dr. Prescott Gum.    She underwent CABG x 3 utilizing LIMA to LAD, SVG to Diagonal, and SVG to OM.  She also underwent endoscopic harvest of her right leg.  This vein was of poor quality.  Her left leg was explored and no vein was found to harvest.  She tolerated the procedure, was placed on multiple drips for support and taken to the SICU in stable condition.  During her stay in the SICU, the patient was weaned and extubated on POD # 1.  She was weaned off pressor support for cardiogenic shock as tolerated.  She did go into atrial fibrillation and was put on IV Amiodarone. She later converted to sinus rhythm and was converted to oral Amiodarone. She did remain on the Milrinone drip for several days post op. Co ox's were monitored. She was hypervolemic and treated  with IV lasix.  She was transfused for expected post operative blood loss anemia. She was put on Coreg and this was titrated accordingly. She did have elevated transaminases post op. These gradually decreased. She had a repeat echo done 11/05. Results showed an unchanged EF of 45-50%.  There was also mild to moderate aortic stenosis.  There was no pericardial effusion.  Advanced heart failure  consult was obtained to help with treatment of heart failure and weaning of milrinone as well as diuresis management. We transferred the patient to the telemetry unit for continued care. We continued to diurese with metolazone and lasix as needed.  Milrinone was ultimately stopped on 12/24/2016.  Incisions are noted to be healing well without evidence of infection.  She is tolerating gradually increasing cardiac rehabilitation activities.  She is maintaining sinus rhythm.  She has required aggressive pulmonary management as well for COPD including nebulized respiratory treatments and supplemental oxygen.  She was able to be weaned down to 3L and will require home use which has been arranged.  She has required some potassium supplement for hypokalemia.  This has improved with most recent level being 3.7.  However she will require close monitoring as she has been started on Aldactone.  She has an expected acute blood loss anemia and values are stable most recent H/H is 9/29.  She has been aggressively diuresed with her weight being back at her baseline.  She will continued aggressive diuretic therapy at discharge.  She continues to ambulate independently.  She is tolerating a heart healthy diet.  At time of discharge the patient is felt to be stable.  Consults: cardiology  Significant Diagnostic Studies: angiography:    Mid RCA lesion, 60 %stenosed.  Prox RCA lesion, 40 %stenosed.  Ost RPDA to RPDA lesion, 90 %stenosed.  Ost Cx to Prox Cx lesion, 100 %stenosed.  Mid LAD lesion, 80 %stenosed.  Ost 2nd Diag to 2nd Diag lesion, 40 %stenosed.  Prox LAD to Mid LAD lesion, 40 %stenosed.  Ost LM to LM lesion, 99 %stenosed.   1. Severe distal left main stenosis 2. Severe mid LAD stenosis 3. 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  4. Moderately severe, calcified stenosis of the mid RCA 5. Elevated filling pressures.   Treatments: surgery:    1. Emergency coronary artery bypass grafting x3 (left internal mammary     artery to LAD, saphenous vein graft to circumflex marginal,     saphenous vein graft to diagonal). 2. Endoscopic harvest of right leg greater saphenous vein. 3. Exploration of left leg greater saphenous vein, but not harvested     due to inadequate size.  Disposition: 01-Home or Self Care   Discharge Medications:  The patient has been discharged on:   1.Beta Blocker:  Yes [   ]                              No   [ x  ]                              If No, reason: heart failure  2.Ace Inhibitor/ARB: Yes [ x  ]  No  [    ]                                     If No, reason:  3.Statin:   Yes [  x ]                  No  [   ]                  If No, reason:  4.Ecasa:  Yes  [  x ]                  No   [   ]                  If No, reason:  Discharge Instructions    Amb Referral to Cardiac Rehabilitation   Complete by:  As directed    Diagnosis:  CABG   CABG X ___:  3   Discharge patient   Complete by:  As directed    Discharge disposition:  01-Home or Self Care   Discharge patient date:  12/27/2016     Allergies as of 12/27/2016   No Known Allergies     Medication List    STOP taking these medications   FLINTSTONES PLUS IRON chewable tablet   SYMBICORT 160-4.5 MCG/ACT inhaler Generic drug:  budesonide-formoterol     TAKE these medications   ALPRAZolam 0.5 MG tablet Commonly known as:  XANAX Take 0.5 mg by mouth 2 (two) times daily as needed for anxiety.   amiodarone 200 MG tablet Commonly known as:  PACERONE Take 1 tablet (200 mg total) 2 (two) times daily by mouth.   aspirin EC 81 MG tablet Take 81 mg by mouth at bedtime.   atorvastatin 40 MG tablet Commonly known as:  LIPITOR Take 1 tablet (40 mg total) daily by mouth. What changed:    medication strength  how much to take   clopidogrel 75 MG tablet Commonly known as:  PLAVIX Take 1  tablet (75 mg total) daily by mouth. Start taking on:  12/28/2016   esomeprazole 40 MG capsule Commonly known as:  NEXIUM Take 1 capsule (40 mg total) by mouth 2 (two) times daily before a meal.   ferrous sulfate 325 (65 FE) MG tablet Take 1 tablet (325 mg total) 2 (two) times daily with a meal by mouth.   folic acid 1 MG tablet Commonly known as:  FOLVITE Take 1 tablet (1 mg total) daily by mouth.   losartan 25 MG tablet Commonly known as:  COZAAR Take 0.5 tablets (12.5 mg total) daily by mouth. Start taking on:  12/28/2016   mometasone-formoterol 100-5 MCG/ACT Aero Commonly known as:  DULERA Inhale 2 puffs 2 (two) times daily into the lungs.   multivitamin with minerals Tabs tablet Take 1 tablet by mouth daily.   oxyCODONE 5 MG immediate release tablet Commonly known as:  Oxy IR/ROXICODONE Take 1-2 tablets (5-10 mg total) every 6 (six) hours as needed by mouth for severe pain.   potassium chloride SA 20 MEQ tablet Commonly known as:  K-DUR,KLOR-CON Take 1 tablet (20 mEq total) 2 (two) times daily by mouth. Do not take if torsemide is discontinued   spironolactone 25 MG tablet Commonly known as:  ALDACTONE Take 0.5 tablets (12.5 mg total) at bedtime by mouth.   torsemide 20 MG tablet Commonly  known as:  DEMADEX Take 2 tablets (40 mg total) 2 (two) times daily by mouth.   VENTOLIN HFA 108 (90 Base) MCG/ACT inhaler Generic drug:  albuterol Inhale 2 puffs into the lungs every 6 (six) hours as needed for shortness of breath.   Vitamin D 2000 units Caps Take 2,000 Units by mouth daily.            Durable Medical Equipment  (From admission, onward)        Start     Ordered   12/27/16 1012  For home use only DME 3 n 1  Once     12/27/16 1012   12/27/16 1012  For home use only DME Walker rolling  Once    Comments:  S/p CABG  Question:  Patient needs a walker to treat with the following condition  Answer:  Weakness   12/27/16 1012   12/26/16 0751  For home  use only DME oxygen  Once    Question Answer Comment  Mode or (Route) Nasal cannula   Liters per Minute 3   Frequency Continuous (stationary and portable oxygen unit needed)   Oxygen conserving device No   Oxygen delivery system Gas      12/26/16 0750     Follow-up Information    Ivin Poot, MD Follow up on 01/19/2017.   Specialty:  Cardiothoracic Surgery Why:  Appointment is at 1:30, please get CXR at 1:00 at [facility] located on first floor of our office buidling Contact information: [address] [place] [address] [phone]        Glenda Chroman, MD. Call in 1 day(s).   Specialty:  Internal Medicine Contact information: [place] [address] [phone]        [facility] Follow up.   Why:  home 02 arranged- portable tank to be delivered to room prior to discharge Contact information: [place] [address] [phone]        [facility] Follow up.   Specialty:  [place] Why:  [place] arranged- they will call you to set up home visits Contact information: [place] [address] [phone]        Bensimhon, Shaune Pascal, MD Follow up on 01/04/2017.   Specialty:  Cardiology Why:  2:30 [place] information: [place] [address] [phone]        [facility] Follow up.   Specialty:  Cardiology Contact information: [place] [place]       [facility] Follow up on 01/04/2017.   Specialty:  Cardiology Why:  2:30 pm: Post hospital follow up appointment with Amy Clegg NP-C. Parking available on [address] underneath the [facility] Entrance (Entrance where construction is located).  Please ignore the sign that  says "do not enter". Code: 1017 Contact information: [address] [phone] [place] [phone]          Signed: John Giovanni 12/27/2016, 3:29 PM

## 2016-12-17 NOTE — Progress Notes (Signed)
Progress Note   Patient Name: Emily Rocha Date of Encounter: 12/17/2016  Primary Cardiologist:   Dr. Harl Bowie  Subjective   Feels OK.  No acute SOB or pain.    Inpatient Medications    Scheduled Meds: . acetaminophen  1,000 mg Oral Q6H   Or  . acetaminophen (TYLENOL) oral liquid 160 mg/5 mL  1,000 mg Per Tube Q6H  . aspirin EC  325 mg Oral Daily   Or  . aspirin  324 mg Per Tube Daily  . atorvastatin  80 mg Oral Daily  . bisacodyl  10 mg Oral Daily   Or  . bisacodyl  10 mg Rectal Daily  . docusate sodium  200 mg Oral Daily  . enoxaparin (LOVENOX) injection  40 mg Subcutaneous Q24H  . ferrous [id number] C-folic acid  1 capsule Oral TID PC  . furosemide  40 mg Intravenous BID  . Influenza vac split quadrivalent PF  0.5 mL Intramuscular Tomorrow-1000  . insulin aspart  0-24 Units Subcutaneous Q4H  . insulin detemir  25 Units Subcutaneous BID  . ipratropium  0.5 mg Nebulization TID  . levalbuterol  1.25 mg Nebulization TID  . mouth rinse  15 mL Mouth Rinse BID  . metoCLOPramide (REGLAN) injection  10 mg Intravenous Q6H  . metoprolol tartrate  12.5 mg Oral BID   Or  . metoprolol tartrate  12.5 mg Per Tube BID  . mometasone-formoterol  2 puff Inhalation BID  . pantoprazole  40 mg Oral Daily  . potassium chloride  20 mEq Oral Daily  . sodium chloride flush  10-40 mL Intracatheter Q12H  . sodium chloride flush  3 mL Intravenous Q12H   Continuous Infusions: . sodium chloride Stopped (12/16/16 0800)  . sodium chloride    . sodium chloride Stopped (12/16/16 1600)  . sodium chloride    . cefTRIAXone (ROCEPHIN)  IV Stopped (12/16/16 2203)  . lactated ringers 10 mL/hr at 12/17/16 0700  . lactated ringers 10 mL/hr at 12/16/16 1900  . milrinone 0.125 mcg/kg/min (12/17/16 0700)  . norepinephrine (LEVOPHED) Adult infusion Stopped (12/16/16 2300)  . phenylephrine (NEO-SYNEPHRINE) Adult infusion Stopped (12/15/16 1139)  . potassium chloride Stopped (12/17/16 0724)    PRN Meds: sodium chloride, fentaNYL (SUBLIMAZE) injection, levalbuterol, metoprolol tartrate, midazolam, ondansetron (ZOFRAN) IV, oxyCODONE **OR** oxyCODONE, oxyCODONE, sodium chloride flush, sodium chloride flush, traMADol   Vital Signs    Vitals:   12/17/16 0630 12/17/16 0645 12/17/16 0700 12/17/16 0715  BP: 114/64 (!) 99/56 119/69 108/65  Pulse: 85 85 88 86  Resp: 17 (!) 25 (!) 29 (!) 29  Temp:      TempSrc:      SpO2: 94% 95% 94% 95%  Weight:      Height:        Intake/Output Summary (Last 24 hours) at 12/17/16 0758 Last data filed at 12/17/16 0700  Gross per 24 hour  Intake          2514.74 ml  Output             2735 ml  Net          -220.26 ml   Filed Weights   12/15/16 0430 12/16/16 0445 12/17/16 0530  Weight: 201 lb 4.5 oz (91.3 kg) 208 lb 3.2 oz (94.4 kg) 208 lb 8.9 oz (94.6 kg)    Telemetry    NSR, rare ectopy - Personally Reviewed  ECG    NA - Personally Reviewed  Physical Exam   GENERAL:  Well  appearing NECK:  No jugular venous distention, waveform within normal limits, carotid upstroke brisk and symmetric, no bruits, no thyromegaly LUNGS:  Clear to auscultation bilaterally CHEST:  Unremarkable HEART:  PMI not displaced or sustained,S1 and S2 within normal limits, no S3, no S4, no clicks, no rubs, soft brief apical systolic murmurs ABD:  Flat, positive bowel sounds normal in frequency in pitch, no bruits, no rebound, no guarding, no midline pulsatile mass, no hepatomegaly, no splenomegaly EXT:  2 plus pulses throughout, moderate diffuse edema, no cyanosis no clubbing    Labs    Chemistry Recent Labs Lab 12/12/16 1857  12/15/16 0328  12/16/16 0253 12/16/16 1556 12/17/16 0242  NA 138  < > 134*  < > 129* 127* 125*  K 3.7  < > 3.9  < > 4.6 4.1 3.5  CL 100*  < > 104  < > 98* 94* 89*  CO2 24  < > 24  --  23  --  28  GLUCOSE 115*  < > 160*  < > 196* 132* 238*  BUN 11  < > 15  < > 14 18 15   CREATININE 0.58  < > 0.73  < > 0.82 0.70 0.66   CALCIUM 9.3  < > 8.7*  --  8.2*  --  8.1*  PROT 7.7  --   --   --   --   --  4.6*  ALBUMIN 4.1  --   --   --   --   --  2.6*  AST 53*  --   --   --   --   --  103*  ALT 110*  --   --   --   --   --  240*  ALKPHOS 101  --   --   --   --   --  66  BILITOT 1.1  --   --   --   --   --  1.0  GFRNONAA >60  < > >60  --  >60  --  >60  GFRAA >60  < > >60  --  >60  --  >60  ANIONGAP 14  < > 6  --  8  --  8  < > = values in this interval not displayed.   Hematology  Recent Labs Lab 12/16/16 0253 12/16/16 1556 12/16/16 1607 12/17/16 0242  WBC 11.6*  --  13.1* 12.2*  RBC 3.13*  --  2.89* 3.13*  HGB 8.3* 7.5* 7.7* 8.6*  HCT 25.6* 22.0* 23.6* 25.7*  MCV 81.8  --  81.7 82.1  MCH 26.5  --  26.6 27.5  MCHC 32.4  --  32.6 33.5  RDW 20.8*  --  20.5* 19.3*  PLT 164  --  150 140*    Cardiac Enzymes  Recent Labs Lab 12/12/16 1856 12/12/16 2301 12/13/16 0554  TROPONINI 2.20* 2.15* 2.02*     Recent Labs Lab 12/12/16 1812  TROPIPOC 1.60*     BNP  Recent Labs Lab 12/12/16 1857  BNP 458.0*     DDimer No results for input(s): DDIMER in the last 168 hours.   Radiology    Dg Chest Port 1 View  Result Date: 12/16/2016 CLINICAL DATA:  CABG. EXAM: PORTABLE CHEST 1 VIEW COMPARISON:  Chest x-ray from yesterday. FINDINGS: Interval removal of endotracheal and enteric tubes. The Swan-Ganz catheter has slightly retracted, but remains in the pulmonary outflow tract. Chest and mediastinal drains are unchanged. Postsurgical changes related to prior CABG  with stable cardiomegaly and basal predominant interstitial and alveolar opacities. No large pleural effusion. No pneumothorax. No acute osseous abnormality. IMPRESSION: 1. Stable cardiomegaly and basal predominant interstitial and alveolar edema. 2. Interval removal of endotracheal and enteric tubes. Other support devices are stable. Electronically Signed   By: Titus Dubin M.D.   On: 12/16/2016 09:16    Cardiac Studies   ECHO:  12/13/16      - Left ventricle: Septal and inferior basal hypokinesis. Systolic   function was mildly reduced. The estimated ejection fraction was   in the range of 45% to 50%. Doppler parameters are consistent   with both elevated ventricular end-diastolic filling pressure and   elevated left atrial filling pressure. - Aortic valve: There was mild stenosis. Valve area (VTI): 1.56   cm^2. Valve area (Vmax): 1.54 cm^2. Valve area (Vmean): 1.51   cm^2. - Mitral valve: There was mild to moderate regurgitation. Valve   area by continuity equation (using LVOT flow): 2.73 cm^2. - Left atrium: The atrium was mildly dilated. - Atrial septum: No defect or patent foramen ovale was identified. - Pulmonary arteries: PA peak pressure: 47 mm Hg (S).    Cardiac Cath:    Conclusion     Mid RCA lesion, 60 %stenosed.  Prox RCA lesion, 40 %stenosed.  Ost RPDA to RPDA lesion, 90 %stenosed.  Ost Cx to Prox Cx lesion, 100 %stenosed.  Mid LAD lesion, 80 %stenosed.  Ost 2nd Diag to 2nd Diag lesion, 40 %stenosed.  Prox LAD to Mid LAD lesion, 40 %stenosed.  Ost LM to LM lesion, 99 %stenosed.   1. Severe distal left main stenosis 2. Severe mid LAD stenosis 3. 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  4. Moderately severe, calcified stenosis of the mid RCA 5. Elevated filling pressures.        Patient Profile     61 y.o. female with a hx of carotid artery stenosis s/p right CEA (12/06/14), former smoker, COPD, GERD, HLD, and PAD who is being seen for the evaluation of elevated troponin at the request of Dr. Thereasa Solo.  Assessment & Plan    NSTEMI:   Critical left main.  Status post CABG.  Reduced EF as above.  Making steady progress.   COPD:     Continue breathing treatments.  Still with decreased breath sounds  DYSLIPIDEMIA:   Increased Lipitor 80.  Will follow liver enzymes.  Up secondary to shock. Marland Kitchen   CARDIOMYOPATHY:   Off  vasopressin, off norepi.  Will likely be able to wean epi.  On milrinone.      ATRIAL FIB:  PAF.   No further arrhythmias could switch to PO.   Signed, Minus Breeding, MD  12/17/2016, 7:58 AM  '

## 2016-12-17 NOTE — Progress Notes (Signed)
TCTS BRIEF SICU PROGRESS NOTE  3 Days Post-Op  S/P Procedure(s) (LRB): CORONARY ARTERY BYPASS GRAFTING (CABG) x three , using left internal mammary artery and right leg greater saphenous vein harvested endoscopically (N/A) TRANSESOPHAGEAL ECHOCARDIOGRAM (TEE) (N/A)   Stable day NSR w/ stable BP Breathing comfortably w/ O2 sats 95% on HFNC UOP adequate  Plan: Continue current plan  Rexene Alberts, MD 12/17/2016 5:50 PM

## 2016-12-17 NOTE — Progress Notes (Signed)
PT Cancellation Note  Patient Details Name: ZHANE DONLAN MRN: [id number] DOB: [birth year]   Cancelled Treatment:    Reason Eval/Treat Not Completed: Medical issues which prohibited therapy (chart reviewed, pt in bed and RN needs to pull introducer. Will attempt later today as time allows)   Oswin Griffith B Loyce Klasen 12/17/2016, 12:32 PM  Elwyn Reach, [place]

## 2016-12-18 ENCOUNTER — Inpatient Hospital Stay (HOSPITAL_COMMUNITY): Payer: BLUE CROSS/BLUE SHIELD

## 2016-12-18 LAB — BPAM RBC
BLOOD PRODUCT EXPIRATION DATE: 201811112359
BLOOD PRODUCT EXPIRATION DATE: 201811122359
BLOOD PRODUCT EXPIRATION DATE: 201811122359
Blood Product Expiration Date: 201811102359
Blood Product Expiration Date: 201811122359
ISSUE DATE / TIME: 201810301537
ISSUE DATE / TIME: 201810301537
ISSUE DATE / TIME: 201811011709
UNIT TYPE AND RH: 600
UNIT TYPE AND RH: 600
Unit Type and Rh: 600
Unit Type and Rh: 600
Unit Type and Rh: 600

## 2016-12-18 LAB — GLUCOSE, CAPILLARY
GLUCOSE-CAPILLARY: 68 mg/dL (ref 65–99)
GLUCOSE-CAPILLARY: 63 mg/dL — AB (ref 65–99)
GLUCOSE-CAPILLARY: 135 mg/dL — AB (ref 65–99)
GLUCOSE-CAPILLARY: 70 mg/dL (ref 65–99)
Glucose-Capillary: 92 mg/dL (ref 65–99)
Glucose-Capillary: 82 mg/dL (ref 65–99)

## 2016-12-18 LAB — POCT I-STAT, CHEM 8
BUN: 29 mg/dL — AB (ref 6–20)
CREATININE: 1 mg/dL (ref 0.44–1.00)
Calcium, Ion: 1.17 mmol/L (ref 1.15–1.40)
Chloride: 85 mmol/L — ABNORMAL LOW (ref 101–111)
GLUCOSE: 76 mg/dL (ref 65–99)
HEMATOCRIT: 27 % — AB (ref 36.0–46.0)
HEMOGLOBIN: 9.2 g/dL — AB (ref 12.0–15.0)
Potassium: 4.4 mmol/L (ref 3.5–5.1)
Sodium: 126 mmol/L — ABNORMAL LOW (ref 135–145)
TCO2: 29 mmol/L (ref 22–32)

## 2016-12-18 LAB — CBC
HCT: 27.4 % — ABNORMAL LOW (ref 36.0–46.0)
Hemoglobin: 8.9 g/dL — ABNORMAL LOW (ref 12.0–15.0)
MCH: 27.2 pg (ref 26.0–34.0)
MCHC: 32.5 g/dL (ref 30.0–36.0)
MCV: 83.8 fL (ref 78.0–100.0)
Platelets: 162 10*3/uL (ref 150–400)
RBC: 3.27 MIL/uL — ABNORMAL LOW (ref 3.87–5.11)
RDW: 18.4 % — ABNORMAL HIGH (ref 11.5–15.5)
WBC: 10.9 10*3/uL — ABNORMAL HIGH (ref 4.0–10.5)

## 2016-12-18 LAB — TYPE AND SCREEN
ABO/RH(D): A NEG
ANTIBODY SCREEN: NEGATIVE
UNIT DIVISION: 0
UNIT DIVISION: 0
UNIT DIVISION: 0
Unit division: 0
Unit division: 0

## 2016-12-18 LAB — COOXEMETRY PANEL
Carboxyhemoglobin: 1.6 % — ABNORMAL HIGH (ref 0.5–1.5)
Methemoglobin: 0.9 % (ref 0.0–1.5)
O2 Saturation: 50.7 %
Total hemoglobin: 9 g/dL — ABNORMAL LOW (ref 12.0–16.0)

## 2016-12-18 LAB — COMPREHENSIVE METABOLIC PANEL
ALT: 286 U/L — ABNORMAL HIGH (ref 14–54)
AST: 127 U/L — ABNORMAL HIGH (ref 15–41)
Albumin: 2.6 g/dL — ABNORMAL LOW (ref 3.5–5.0)
Alkaline Phosphatase: 103 U/L (ref 38–126)
Anion gap: 9 (ref 5–15)
BUN: 24 mg/dL — ABNORMAL HIGH (ref 6–20)
CO2: 30 mmol/L (ref 22–32)
Calcium: 8.4 mg/dL — ABNORMAL LOW (ref 8.9–10.3)
Chloride: 89 mmol/L — ABNORMAL LOW (ref 101–111)
Creatinine, Ser: 0.76 mg/dL (ref 0.44–1.00)
GFR calc Af Amer: >60 mL/min (ref >60)
GFR calc non Af Amer: >60 mL/min (ref >60)
Glucose, Bld: 73 mg/dL (ref 65–99)
Potassium: 3.9 mmol/L (ref 3.5–5.1)
Sodium: 128 mmol/L — ABNORMAL LOW (ref 135–145)
Total Bilirubin: 0.8 mg/dL (ref 0.3–1.2)
Total Protein: 5.1 g/dL — ABNORMAL LOW (ref 6.5–8.1)

## 2016-12-18 MED ORDER — INSULIN DETEMIR 100 UNIT/ML ~~LOC~~ SOLN
25.0000 [IU] | Freq: Every day | SUBCUTANEOUS | Status: DC
  Administered 2016-12-19: 25 [IU] via SUBCUTANEOUS
  Filled 2016-12-18 (×2): qty 0.25

## 2016-12-18 MED ORDER — ALBUMIN HUMAN 5 % IV SOLN: 12.5000 g | Freq: Once | INTRAVENOUS | Status: AC

## 2016-12-18 MED ORDER — ALBUMIN HUMAN 5 % IV SOLN
INTRAVENOUS | Status: AC
  Administered 2016-12-18: 12.5 g
  Filled 2016-12-18: qty 250

## 2016-12-18 NOTE — Progress Notes (Signed)
Progress Note  Patient Name: Emily Rocha Date of Encounter: 12/18/2016  Primary Cardiologist: Dr Harl Bowie  Subjective   No chest pain or dyspnea  Inpatient Medications    Scheduled Meds: . acetaminophen  1,000 mg Oral Q6H   Or  . acetaminophen (TYLENOL) oral liquid 160 mg/5 mL  1,000 mg Per Tube Q6H  . amiodarone  400 mg Oral BID  . aspirin EC  81 mg Oral Daily  . atorvastatin  80 mg Oral Daily  . bisacodyl  10 mg Oral Daily   Or  . bisacodyl  10 mg Rectal Daily  . carvedilol  6.25 mg Oral BID WC  . clopidogrel  75 mg Oral Daily  . docusate sodium  200 mg Oral Daily  . enoxaparin (LOVENOX) injection  40 mg Subcutaneous Q24H  . ferrous [id number] C-folic acid  1 capsule Oral TID PC  . furosemide  40 mg Intravenous BID  . insulin aspart  0-24 Units Subcutaneous TID AC & HS  . insulin detemir  25 Units Subcutaneous BID  . ipratropium  0.5 mg Nebulization TID  . levalbuterol  1.25 mg Nebulization TID  . mouth rinse  15 mL Mouth Rinse BID  . metoCLOPramide (REGLAN) injection  10 mg Intravenous Q6H  . mometasone-formoterol  2 puff Inhalation BID  . pantoprazole  40 mg Oral Daily  . potassium chloride  20 mEq Oral Daily  . sodium chloride flush  10-40 mL Intracatheter Q12H  . sodium chloride flush  3 mL Intravenous Q12H   Continuous Infusions: . sodium chloride Stopped (12/16/16 0800)  . sodium chloride    . sodium chloride Stopped (12/16/16 1600)  . sodium chloride    . cefTRIAXone (ROCEPHIN)  IV Stopped (12/17/16 2217)  . lactated ringers 10 mL/hr at 12/17/16 1800  . lactated ringers 10 mL/hr at 12/16/16 1900  . milrinone 0.125 mcg/kg/min (12/18/16 0435)  . norepinephrine (LEVOPHED) Adult infusion Stopped (12/16/16 2300)  . phenylephrine (NEO-SYNEPHRINE) Adult infusion Stopped (12/15/16 1139)   PRN Meds: sodium chloride, fentaNYL (SUBLIMAZE) injection, Influenza vac split quadrivalent PF, levalbuterol, metoprolol tartrate, midazolam, ondansetron (ZOFRAN)  IV, oxyCODONE **OR** oxyCODONE, oxyCODONE, sodium chloride flush, sodium chloride flush, traMADol   Vital Signs    Vitals:   12/18/16 0700 12/18/16 0745 12/18/16 0833 12/18/16 0837  BP: 96/64 102/64 104/81 104/81  Pulse: 88 89 85 91  Resp:  19    Temp:   98.6 F (37 C)   TempSrc:   Oral   SpO2: 99% 95% 95%   Weight:      Height:        Intake/Output Summary (Last 24 hours) at 12/18/16 0924 Last data filed at 12/18/16 0600  Gross per 24 hour  Intake          1386.33 ml  Output             1555 ml  Net          -168.67 ml   Filed Weights   12/16/16 0445 12/17/16 0530 12/18/16 0500  Weight: 208 lb 3.2 oz (94.4 kg) 208 lb 8.9 oz (94.6 kg) 206 lb 5.6 oz (93.6 kg)    Telemetry    Sinus - Personally Reviewed  Physical Exam   GEN: No acute distress.   Neck: No JVD Cardiac: RRR, no murmurs, rubs, or gallops.  Respiratory: Diminished BS bases; s/p sternotomy GI: Soft, nontender, non-distended  MS: trace to 1+ edema Neuro:  Nonfocal  Psych: Normal affect   Labs  Chemistry Recent Labs Lab 12/12/16 1857  12/16/16 0253  12/17/16 0242 12/17/16 1659 12/18/16 0430  NA 138  < > 129*  < > 125* 129* 128*  K 3.7  < > 4.6  < > 3.5 4.3 3.9  CL 100*  < > 98*  < > 89* 90* 89*  CO2 24  < > 23  --  28  --  30  GLUCOSE 115*  < > 196*  < > 238* 76 73  BUN 11  < > 14  < > 15 18 24*  CREATININE 0.58  < > 0.82  < > 0.66 0.70 0.76  CALCIUM 9.3  < > 8.2*  --  8.1*  --  8.4*  PROT 7.7  --   --   --  4.6*  --  5.1*  ALBUMIN 4.1  --   --   --  2.6*  --  2.6*  AST 53*  --   --   --  103*  --  127*  ALT 110*  --   --   --  240*  --  286*  ALKPHOS 101  --   --   --  66  --  103  BILITOT 1.1  --   --   --  1.0  --  0.8  GFRNONAA >60  < > >60  --  >60  --  >60  GFRAA >60  < > >60  --  >60  --  >60  ANIONGAP 14  < > 8  --  8  --  9  < > = values in this interval not displayed.   Hematology Recent Labs Lab 12/16/16 1607 12/17/16 0242 12/17/16 1659 12/18/16 0430  WBC 13.1* 12.2*   --  10.9*  RBC 2.89* 3.13*  --  3.27*  HGB 7.7* 8.6* 9.9* 8.9*  HCT 23.6* 25.7* 29.0* 27.4*  MCV 81.7 82.1  --  83.8  MCH 26.6 27.5  --  27.2  MCHC 32.6 33.5  --  32.5  RDW 20.5* 19.3*  --  18.4*  PLT 150 140*  --  162    Cardiac Enzymes Recent Labs Lab 12/12/16 1856 12/12/16 2301 12/13/16 0554  TROPONINI 2.20* 2.15* 2.02*    Recent Labs Lab 12/12/16 1812  TROPIPOC 1.60*     BNP Recent Labs Lab 12/12/16 1857  BNP 458.0*      Radiology    Dg Chest Port 1 View  Result Date: 12/18/2016 CLINICAL DATA:  61 year old female with a history of CABG EXAM: PORTABLE CHEST 1 VIEW COMPARISON:  12/17/2016, 12/16/2016, 12/15/2016 FINDINGS: Cardiomediastinal silhouette unchanged in comparison with mild widening at the vascular pedicle consistent with recent surgical history. Surgical changes of median sternotomy and CABG. Unchanged position of right upper extremity PICC, bilateral large bore thoracostomy tubes. EKG leads project over the lower chest. No epicardial pacing leads or mediastinal drains are confidently visualized on the current study. Since the prior chest x-ray there is increased opacification at the lung bases with partial obscuration of the hemidiaphragm and heart borders. Interlobular septal thickening present bilaterally. Hazy opacities at the lung bases. No pneumothorax. IMPRESSION: Slight interval worsening of aeration at the lung bases with partial obscuration of the hemidiaphragms on today's study suggesting bibasilar pleural effusions with atelectasis/consolidation. Interlobular septal thickening bilaterally, potentially representing a combination of chronic lung scarring and/ or pulmonary edema. Surgical changes of median sternotomy and CABG. Unchanged bilateral thoracostomy tube and right upper extremity PICC. Electronically Signed   By:  Corrie Mckusick D.O.   On: 12/18/2016 09:12   Dg Chest Port 1 View  Result Date: 12/17/2016 CLINICAL DATA:  Post CABG.  Chest tube.  EXAM: PORTABLE CHEST 1 VIEW COMPARISON:  12/16/2016; 12/12/2016; 11/05/2016; chest CT - 12/04/2016 FINDINGS: Grossly unchanged cardiac silhouette and mediastinal contours post median sternotomy and CABG. Interval removal of mediastinal drain. Unchanged position of bilateral chest tubes. Interval removal of PA catheter with vascular sheath tip projected over the mid/distal SVC. Interval placement of right upper extremity approach PICC line with tip projected over the superior cavoatrial junction. No pneumothorax. Pulmonary vasculature remains indistinct with cephalization of flow. Grossly unchanged bibasilar heterogeneous opacities, left greater than right, likely atelectasis. No new focal airspace opacities. No acute osseus abnormalities. IMPRESSION: 1. Support apparatus as above.  No pneumothorax. 2. Similar findings of pulmonary edema and bibasilar atelectasis. Electronically Signed   By: Sandi Mariscal M.D.   On: 12/17/2016 07:58    Patient Profile     61 y.o. female admitted with non-ST elevation myocardial infarction. Cardiac catheterization revealed severe coronary disease including distal left main. She was taken emergently for cardiac catheterization. Echocardiogram preoperatively showed ejection fraction 45-50%, mild aortic stenosis, mild to moderate mitral regurgitation and mild left atrial enlargement.   Assessment & Plan    1 coronary artery disease status post coronary artery bypassing graft-continue aspirin, Plavix in the setting of non-ST elevation myocardial infarction, statin and low-dose metoprolol. Wean milrinone to off.  2 ischemic cardiomyopathy-continue carvedilol. Add ARB later as blood pressure tolerates.  3 postoperative atrial fibrillation-patient remains in sinus rhythm. Continue amiodarone 400 mg twice a day for 2 weeks then decrease to 200 mg daily. Would discontinue in 8 weeks if patient hold sinus rhythm.   4 postoperative volume excess-continue diuresis and follow renal  function.  5 elevated liver functions-patient will need close follow-up given that she is on both a statin and amiodarone. Recent elevation felt secondary to shock.   For questions or updates, please contact [place] Please consult [email or web address] for contact info under Cardiology/STEMI.      Signed, Kirk Ruths, MD  12/18/2016, 9:24 AM

## 2016-12-18 NOTE — Progress Notes (Signed)
[address]       [place] [address]             [phone]        CARDIOTHORACIC SURGERY PROGRESS NOTE   R4 Days Post-Op Procedure(s) (LRB): CORONARY ARTERY BYPASS GRAFTING (CABG) x three , using left internal mammary artery and right leg greater saphenous vein harvested endoscopically (N/A) TRANSESOPHAGEAL ECHOCARDIOGRAM (TEE) (N/A)  Subjective: Feels crappy.  Diaphoretic.  Thinks her blood sugar is low.  BP 69 systolic, repeat 82  Objective: Vital signs: BP Readings from Last 1 Encounters:  12/18/16 104/81   Pulse Readings from Last 1 Encounters:  12/18/16 91   Resp Readings from Last 1 Encounters:  12/18/16 19   Temp Readings from Last 1 Encounters:  12/18/16 98.6 F (37 C) (Oral)    Hemodynamics:   Mixed venous co-ox 50.7%   Physical Exam:  Rhythm:   sinus  Breath sounds: clear  Heart sounds:  RRR  Incisions:  Clean and dry  Abdomen:  Soft, non-distended, non-tender  Extremities:  Warm, well-perfused  Chest tubes:  decreasing volume thin serosanguinous output, no air leak    Intake/Output from previous day: 11/02 0701 - 11/03 0700 In: 1476.1 [P.O.:1020; I.V.:304.1; IV Piggyback:100] Out: 1995 [MWUXL:2440; Chest Tube:220] Intake/Output this shift: Total I/O In: 120 [P.O.:120] Out: -   Lab Results:  CBC: Recent Labs  12/17/16 0242 12/17/16 1659 12/18/16 0430  WBC 12.2*  --  10.9*  HGB 8.6* 9.9* 8.9*  HCT 25.7* 29.0* 27.4*  PLT 140*  --  162    BMET:  Recent Labs  12/17/16 0242 12/17/16 1659 12/18/16 0430  NA 125* 129* 128*  K 3.5 4.3 3.9  CL 89* 90* 89*  CO2 28  --  30  GLUCOSE 238* 76 73  BUN 15 18 24*  CREATININE 0.66 0.70 0.76  CALCIUM 8.1*  --  8.4*     PT/INR:  No results for input(s): LABPROT, INR in the last 72 hours.  CBG (last 3)   Recent Labs  12/17/16 1805 12/17/16 2010 12/18/16 0834  GLUCAP 108* 78 68    ABG    Component Value Date/Time   PHART 7.421 12/16/2016 0306   PCO2ART  41.3 12/16/2016 0306   PO2ART 92.0 12/16/2016 0306   HCO3 26.7 12/16/2016 0306   TCO2 31 12/17/2016 1659   O2SAT 50.7 12/18/2016 0449    CXR: PORTABLE CHEST 1 VIEW  COMPARISON:  12/17/2016, 12/16/2016, 12/15/2016  FINDINGS: Cardiomediastinal silhouette unchanged in comparison with mild widening at the vascular pedicle consistent with recent surgical history.  Surgical changes of median sternotomy and CABG.  Unchanged position of right upper extremity PICC, bilateral large bore thoracostomy tubes.  EKG leads project over the lower chest. No epicardial pacing leads or mediastinal drains are confidently visualized on the current study.  Since the prior chest x-ray there is increased opacification at the lung bases with partial obscuration of the hemidiaphragm and heart borders. Interlobular septal thickening present bilaterally. Hazy opacities at the lung bases.  No pneumothorax.  IMPRESSION: Slight interval worsening of aeration at the lung bases with partial obscuration of the hemidiaphragms on today's study suggesting bibasilar pleural effusions with atelectasis/consolidation.  Interlobular septal thickening bilaterally, potentially representing a combination of chronic lung scarring and/ or pulmonary edema.  Surgical changes of median sternotomy and CABG.  Unchanged bilateral thoracostomy tube and right upper extremity PICC.   Electronically Signed   By: Corrie Mckusick D.O.  On: 12/18/2016 09:12  Assessment/Plan: S/P Procedure(s) (LRB): CORONARY ARTERY BYPASS GRAFTING (CABG) x three , using left internal mammary artery and right leg greater saphenous vein harvested endoscopically (N/A) TRANSESOPHAGEAL ECHOCARDIOGRAM (TEE) (N/A)  Maintaining NSR but BP low this morning, mixed venous co-ox 51% Hypoglycemic last 12 hours   Hold lasix  Gentle volume  Continue milrinone  Recheck co-ox in 2-3 hours  Decrease levemir insulin  Rexene Alberts, MD 12/18/2016 10:18 AM

## 2016-12-18 NOTE — Evaluation (Addendum)
Physical Therapy Evaluation Patient Details Name: Emily Rocha MRN: [id number] DOB: March 16, 1955 Today's Date: 12/18/2016   History of Present Illness  61 y.o. female admitted with non-ST elevation myocardial infarction. Cardiac catheterization revealed severe coronary disease including distal left main. She was taken emergently for cardiac catheterization. Echocardiogram preoperatively showed ejection fraction 45-50%, mild aortic stenosis, mild to moderate mitral regurgitation and mild left atrial enlargement.  CABG x 3 on 12/15/16.  Pt with  hx of carotid artery stenosis s/p right CEA (12/06/14), former smoker, COPD, GERD, HLD, and PAD.   Clinical Impression  Pt admitted with above diagnosis. Pt currently with functional limitations due to the deficits listed below (see PT Problem List). Pt was able to ambulate with [place] walker with mod assist of 2 persons with chair follow.  BP soft with pt slightly dizzy but worked through it.  Ending BP 92/61.  HR stable during walk and sats on 4L with ambulation to keep >90%.  Returned O2 to 2L after walk and measured at 94%.   Pt will benefit from skilled PT to increase their independence and safety with mobility to allow discharge to the venue listed below.      Follow Up Recommendations Home health PT;Supervision/Assistance - 24 hour (if no 24 hour care, will need SNF)    Equipment Recommendations  Rolling walker with 5" wheels;3in1 (PT)    Recommendations for Other Services       Precautions / Restrictions Precautions Precautions: Sternal;Fall Precaution Comments: Chest tube, pacemaker Restrictions Weight Bearing Restrictions: No      Mobility  Bed Mobility Overal bed mobility: Needs Assistance Bed Mobility: Rolling;Sidelying to Sit Rolling: Min assist Sidelying to sit: Mod assist       General bed mobility comments: Assist for sternal precautions.   Transfers Overall transfer level: Needs assistance Equipment used:  (EVa  walker) Transfers: Sit to/from [facility] Sit to Stand: Min assist Stand pivot transfers: Min assist       General transfer comment: Assist needed to push up on LEs and not use UEs.  Pt needs cues each attempt but can do it with her LEs when she is cued and given time.  Pt was able to stand and pivot to 3N1 with bil UE support.  Total assist to clean herself.  Then stood to [place] walker 3x and needed max cues each time for UE placement (sternal precautions).  Ambulation/Gait Ambulation/Gait assistance: Min assist;Mod assist;+2 safety/equipment Ambulation Distance (Feet): 190 Feet (had one sitting rest break about 1/2 way) Assistive device:  [place] walker) Gait Pattern/deviations: Step-through pattern;Decreased step length - right;Decreased step length - left;Decreased stride length;Antalgic;Leaning posteriorly;Staggering left;Staggering right;Drifts right/left;Trunk flexed;Wide base of support   Gait velocity interpretation: Below normal speed for age/gender General Gait Details: Pt needed cues for sternal precautions and for upright posture while walking constantly.  Pt lets [place] walker get away from her at times.  Pt needed 1 seated rest break.  Needs cues to keep eyes open.  Overall did well just needs encouragement and cues.  Stairs            Wheelchair Mobility    Modified Rankin (Stroke Patients Only)       Balance Overall balance assessment: Needs assistance;History of Falls Sitting-balance support: No upper extremity supported;Feet supported Sitting balance-Leahy Scale: Poor Sitting balance - Comments: Hard for pt to balance on bed.    Standing balance support: Bilateral upper extremity supported;During functional activity Standing balance-Leahy Scale: Poor Standing balance comment: relies on  UEs upport.                               Pertinent Vitals/Pain Pain Assessment: Faces Faces Pain Scale: Hurts little more Pain Location:  incision Pain Descriptors / Indicators: Aching;Grimacing;Guarding Pain Intervention(s): Limited activity within patient's tolerance;Monitored during session;Repositioned    Home Living Family/patient expects to be discharged to:: Private residence Living Arrangements: Spouse/significant other Available Help at Discharge: Family;Available 24 hours/day Type of Home: House Home Access: Level entry     Home Layout: One level Home Equipment: None      Prior Function Level of Independence: Independent               Hand Dominance        Extremity/Trunk Assessment   Upper Extremity Assessment Upper Extremity Assessment: Defer to OT evaluation    Lower Extremity Assessment Lower Extremity Assessment: Generalized weakness    Cervical / Trunk Assessment Cervical / Trunk Assessment: Kyphotic  Communication   Communication: HOH  Cognition Arousal/Alertness: Lethargic;Suspect due to medications Behavior During Therapy: Flat affect Overall Cognitive Status: Within Functional Limits for tasks assessed                                 General Comments: Needed incr cues to follow sternal precautions      General Comments      Exercises     Assessment/Plan    PT Assessment Patient needs continued PT services  PT Problem List Decreased strength;Decreased activity tolerance;Decreased balance;Decreased mobility;Decreased knowledge of use of DME;Decreased safety awareness;Decreased knowledge of precautions;Cardiopulmonary status limiting activity;Pain       PT Treatment Interventions DME instruction;Gait training;Functional mobility training;Therapeutic activities;Therapeutic exercise;Balance training;Patient/family education    PT Goals (Current goals can be found in the Care Plan section)  Acute Rehab PT Goals Patient Stated Goal: to go home PT Goal Formulation: With patient Time For Goal Achievement: 01/01/17 Potential to Achieve Goals: Good     Frequency Min 3X/week   Barriers to discharge        Co-evaluation               AM-PAC PT "6 Clicks" Daily Activity  Outcome Measure Difficulty turning over in bed (including adjusting bedclothes, sheets and blankets)?: A Lot Difficulty moving from lying on back to sitting on the side of the bed? : A Lot Difficulty sitting down on and standing up from a chair with arms (e.g., wheelchair, bedside commode, etc,.)?: A Lot Help needed moving to and from a bed to chair (including a wheelchair)?: A Lot Help needed walking in hospital room?: A Lot Help needed climbing 3-5 steps with a railing? : Total 6 Click Score: 11    End of Session Equipment Utilized During Treatment: Gait belt;Oxygen Activity Tolerance: Patient limited by fatigue Patient left: in chair;with family/visitor present;with call bell/phone within reach Nurse Communication: Mobility status PT Visit Diagnosis: Unsteadiness on feet (R26.81);Muscle weakness (generalized) (M62.81);Pain Pain - part of body:  (incision)    Time: 7412-8786 PT Time Calculation (min) (ACUTE ONLY): 48 min   Charges:   PT Evaluation $PT Eval Moderate Complexity: 1 Mod PT Treatments $Gait Training: 8-22 mins $Self Care/Home Management: 8-22   PT G Codes:        [name],PT Acute Rehabilitation [phone] (pager)   Denice Paradise 12/18/2016, 4:24 PM

## 2016-12-19 ENCOUNTER — Inpatient Hospital Stay (HOSPITAL_COMMUNITY): Payer: BLUE CROSS/BLUE SHIELD

## 2016-12-19 ENCOUNTER — Other Ambulatory Visit: Payer: Self-pay

## 2016-12-19 LAB — COMPREHENSIVE METABOLIC PANEL
ALT: 321 U/L — ABNORMAL HIGH (ref 14–54)
AST: 122 U/L — ABNORMAL HIGH (ref 15–41)
Albumin: 2.8 g/dL — ABNORMAL LOW (ref 3.5–5.0)
Alkaline Phosphatase: 171 U/L — ABNORMAL HIGH (ref 38–126)
Anion gap: 9 (ref 5–15)
BUN: 24 mg/dL — ABNORMAL HIGH (ref 6–20)
CO2: 31 mmol/L (ref 22–32)
Calcium: 8.5 mg/dL — ABNORMAL LOW (ref 8.9–10.3)
Chloride: 86 mmol/L — ABNORMAL LOW (ref 101–111)
Creatinine, Ser: 0.74 mg/dL (ref 0.44–1.00)
GFR calc Af Amer: >60 mL/min (ref >60)
GFR calc non Af Amer: >60 mL/min (ref >60)
Glucose, Bld: 91 mg/dL (ref 65–99)
Potassium: 4 mmol/L (ref 3.5–5.1)
Sodium: 126 mmol/L — ABNORMAL LOW (ref 135–145)
Total Bilirubin: 0.9 mg/dL (ref 0.3–1.2)
Total Protein: 5.5 g/dL — ABNORMAL LOW (ref 6.5–8.1)

## 2016-12-19 LAB — GLUCOSE, CAPILLARY
GLUCOSE-CAPILLARY: 92 mg/dL (ref 65–99)
GLUCOSE-CAPILLARY: 143 mg/dL — AB (ref 65–99)
Glucose-Capillary: 125 mg/dL — ABNORMAL HIGH (ref 65–99)
Glucose-Capillary: 207 mg/dL — ABNORMAL HIGH (ref 65–99)

## 2016-12-19 LAB — CBC
HCT: 28.1 % — ABNORMAL LOW (ref 36.0–46.0)
Hemoglobin: 9.1 g/dL — ABNORMAL LOW (ref 12.0–15.0)
MCH: 27 pg (ref 26.0–34.0)
MCHC: 32.4 g/dL (ref 30.0–36.0)
MCV: 83.4 fL (ref 78.0–100.0)
Platelets: 197 10*3/uL (ref 150–400)
RBC: 3.37 MIL/uL — ABNORMAL LOW (ref 3.87–5.11)
RDW: 18.5 % — ABNORMAL HIGH (ref 11.5–15.5)
WBC: 9.3 10*3/uL (ref 4.0–10.5)

## 2016-12-19 LAB — COOXEMETRY PANEL
Carboxyhemoglobin: 1.2 % (ref 0.5–1.5)
Carboxyhemoglobin: 2 % — ABNORMAL HIGH (ref 0.5–1.5)
Methemoglobin: 1.4 % (ref 0.0–1.5)
Methemoglobin: 0.7 % (ref 0.0–1.5)
O2 SAT: 67.8 %
O2 Saturation: 49.1 %
TOTAL HEMOGLOBIN: 8.6 g/dL — AB (ref 12.0–16.0)
Total hemoglobin: 9.2 g/dL — ABNORMAL LOW (ref 12.0–16.0)

## 2016-12-19 MED ORDER — MILRINONE LACTATE IN DEXTROSE 20-5 MG/100ML-% IV SOLN
0.1250 ug/kg/min | INTRAVENOUS | Status: DC
  Administered 2016-12-19 – 2016-12-22 (×6): 0.3 ug/kg/min via INTRAVENOUS
  Administered 2016-12-22: 0.125 ug/kg/min via INTRAVENOUS
  Filled 2016-12-19 (×7): qty 100

## 2016-12-19 MED ORDER — POTASSIUM CHLORIDE 10 MEQ/50ML IV SOLN
INTRAVENOUS | Status: AC
  Filled 2016-12-19: qty 50

## 2016-12-19 MED ORDER — POTASSIUM CHLORIDE 10 MEQ/50ML IV SOLN
10.0000 meq | INTRAVENOUS | Status: AC
  Administered 2016-12-19 (×5): 10 meq via INTRAVENOUS
  Filled 2016-12-19 (×4): qty 50

## 2016-12-19 MED ORDER — FUROSEMIDE 10 MG/ML IJ SOLN
10.0000 mg/h | INTRAVENOUS | Status: DC
  Administered 2016-12-19 – 2016-12-20 (×2): 10 mg/h via INTRAVENOUS
  Filled 2016-12-19 (×2): qty 25
  Filled 2016-12-19: qty 20
  Filled 2016-12-19: qty 25

## 2016-12-19 MED ORDER — FUROSEMIDE 10 MG/ML IJ SOLN: 40.0000 mg | Freq: Two times a day (BID) | INTRAMUSCULAR | Status: DC

## 2016-12-19 MED ORDER — AMIODARONE HCL 200 MG PO TABS
200.0000 mg | ORAL_TABLET | Freq: Two times a day (BID) | ORAL | Status: DC
  Administered 2016-12-19 – 2016-12-27 (×18): 200 mg via ORAL
  Filled 2016-12-19 (×18): qty 1

## 2016-12-19 NOTE — Progress Notes (Addendum)
      [address]       [place] [address]             [phone]        CARDIOTHORACIC SURGERY PROGRESS NOTE   R5 Days Post-Op Procedure(s) (LRB): CORONARY ARTERY BYPASS GRAFTING (CABG) x three , using left internal mammary artery and right leg greater saphenous vein harvested endoscopically (N/A) TRANSESOPHAGEAL ECHOCARDIOGRAM (TEE) (N/A)  Subjective: Feels okay. Weak.  Dyspneic with activity.  Objective: Vital signs: BP Readings from Last 1 Encounters:  12/19/16 94/61   Pulse Readings from Last 1 Encounters:  12/19/16 93   Resp Readings from Last 1 Encounters:  12/19/16 (!) 22   Temp Readings from Last 1 Encounters:  12/19/16 97.6 F (36.4 C) (Oral)    Hemodynamics:   Mixed venous co-ox 49%  Physical Exam:  Rhythm:   sinus  Breath sounds: clear  Heart sounds:  RRR  Incisions:  Clean and dry  Abdomen:  Soft, non-distended, non-tender  Extremities:  Warm, well-perfused, swollen   Intake/Output from previous day: 11/03 0701 - 11/04 0700 In: 1210.4 [P.O.:960; I.V.:200.4; IV Piggyback:50] Out: 5732 [Urine:1250; Chest Tube:280] Intake/Output this shift: No intake/output data recorded.  Lab Results:  CBC: Recent Labs    12/18/16 0430 12/18/16 1741 12/19/16 0443  WBC 10.9*  --  9.3  HGB 8.9* 9.2* 9.1*  HCT 27.4* 27.0* 28.1*  PLT 162  --  197    BMET:  Recent Labs    12/18/16 0430 12/18/16 1741 12/19/16 0443  NA 128* 126* 126*  K 3.9 4.4 4.0  CL 89* 85* 86*  CO2 30  --  31  GLUCOSE 73 76 91  BUN 24* 29* 24*  CREATININE 0.76 1.00 0.74  CALCIUM 8.4*  --  8.5*     PT/INR:  No results for input(s): LABPROT, INR in the last 72 hours.  CBG (last 3)  Recent Labs    12/18/16 1350 12/18/16 1657 12/18/16 2107  GLUCAP 135* 70 82    ABG    Component Value Date/Time   PHART 7.421 12/16/2016 0306   PCO2ART 41.3 12/16/2016 0306   PO2ART 92.0 12/16/2016 0306   HCO3 26.7 12/16/2016 0306   TCO2 29 12/18/2016 1741   O2SAT  49.1 12/19/2016 0430    CXR: n/a  Assessment/Plan: S/P Procedure(s) (LRB): CORONARY ARTERY BYPASS GRAFTING (CABG) x three , using left internal mammary artery and right leg greater saphenous vein harvested endoscopically (N/A) TRANSESOPHAGEAL ECHOCARDIOGRAM (TEE) (N/A)  Overall stable and maintaining NSR but mixed venous co-ox remains low 49% and LE edema increased CBG's improved   Increase milrinone  Resume lasix - will try continuous infusion since BP marginal  Mobilize  Recheck ECHO tomorrow  Rexene Alberts, MD 12/19/2016 8:35 AM

## 2016-12-19 NOTE — Progress Notes (Signed)
[facility] downtime from 1:30-3am- see paper flowsheets.

## 2016-12-19 NOTE — Progress Notes (Signed)
[facility] BRIEF SICU PROGRESS NOTE  5 Days Post-Op  S/P Procedure(s) (LRB): CORONARY ARTERY BYPASS GRAFTING (CABG) x three , using left internal mammary artery and right leg greater saphenous vein harvested endoscopically (N/A) TRANSESOPHAGEAL ECHOCARDIOGRAM (TEE) (N/A)   Stable day Diuresing very well on lasix drip  Plan: Will check repeat co-ox  Rexene Alberts, MD 12/19/2016 6:01 PM

## 2016-12-19 NOTE — Progress Notes (Signed)
Progress Note  Patient Name: Emily Rocha Date of Encounter: 12/19/2016  Primary Cardiologist: Dr Harl Bowie  Subjective   No CP or dyspnea  Inpatient Medications    Scheduled Meds: . acetaminophen  1,000 mg Oral Q6H  . amiodarone  400 mg Oral BID  . aspirin EC  81 mg Oral Daily  . atorvastatin  80 mg Oral Daily  . bisacodyl  10 mg Oral Daily   Or  . bisacodyl  10 mg Rectal Daily  . carvedilol  6.25 mg Oral BID WC  . clopidogrel  75 mg Oral Daily  . docusate sodium  200 mg Oral Daily  . enoxaparin (LOVENOX) injection  40 mg Subcutaneous Q24H  . ferrous [id number] C-folic acid  1 capsule Oral TID PC  . insulin aspart  0-24 Units Subcutaneous TID AC & HS  . insulin detemir  25 Units Subcutaneous Daily  . ipratropium  0.5 mg Nebulization TID  . levalbuterol  1.25 mg Nebulization TID  . mouth rinse  15 mL Mouth Rinse BID  . metoCLOPramide (REGLAN) injection  10 mg Intravenous Q6H  . mometasone-formoterol  2 puff Inhalation BID  . pantoprazole  40 mg Oral Daily  . sodium chloride flush  10-40 mL Intracatheter Q12H  . sodium chloride flush  3 mL Intravenous Q12H   Continuous Infusions: . sodium chloride    . sodium chloride    . cefTRIAXone (ROCEPHIN)  IV Stopped (12/18/16 2253)  . lactated ringers 10 mL/hr at 12/17/16 1800  . lactated ringers 10 mL/hr at 12/16/16 1900  . milrinone 0.125 mcg/kg/min (12/18/16 0800)   PRN Meds: fentaNYL (SUBLIMAZE) injection, Influenza vac split quadrivalent PF, levalbuterol, metoprolol tartrate, ondansetron (ZOFRAN) IV, oxyCODONE **OR** oxyCODONE, oxyCODONE, sodium chloride flush, sodium chloride flush, traMADol   Vital Signs    Vitals:   12/19/16 0300 12/19/16 0400 12/19/16 0500 12/19/16 0530  BP: 102/62 110/66  94/61  Pulse: 85 87  93  Resp: 16 (!) 29  (!) 22  Temp: 97.6 F (36.4 C)     TempSrc: Oral     SpO2: 96% 96%  95%  Weight:   205 lb 11 oz (93.3 kg)   Height:        Intake/Output Summary (Last 24 hours) at  12/19/2016 0749 Last data filed at 12/19/2016 0500 Gross per 24 hour  Intake 1210.4 ml  Output 1530 ml  Net -319.6 ml   Filed Weights   12/17/16 0530 12/18/16 0500 12/19/16 0500  Weight: 208 lb 8.9 oz (94.6 kg) 206 lb 5.6 oz (93.6 kg) 205 lb 11 oz (93.3 kg)    Telemetry    Sinus - Personally Reviewed  Physical Exam   GEN: WD obese No acute distress.   Neck: supple Cardiac: RRR Respiratory: Mildly diminished BS bases; s/p sternotomy GI: Soft, nontender, non-distended, no masses  MS: 1+ edema Neuro:  Nonfocal; grossly intact   Labs    Chemistry Recent Labs  Lab 12/17/16 0242  12/18/16 0430 12/18/16 1741 12/19/16 0443  NA 125*   < > 128* 126* 126*  K 3.5   < > 3.9 4.4 4.0  CL 89*   < > 89* 85* 86*  CO2 28  --  30  --  31  GLUCOSE 238*   < > 73 76 91  BUN 15   < > 24* 29* 24*  CREATININE 0.66   < > 0.76 1.00 0.74  CALCIUM 8.1*  --  8.4*  --  8.5*  PROT  4.6*  --  5.1*  --  5.5*  ALBUMIN 2.6*  --  2.6*  --  2.8*  AST 103*  --  127*  --  122*  ALT 240*  --  286*  --  321*  ALKPHOS 66  --  103  --  171*  BILITOT 1.0  --  0.8  --  0.9  GFRNONAA >60  --  >60  --  >60  GFRAA >60  --  >60  --  >60  ANIONGAP 8  --  9  --  9   < > = values in this interval not displayed.     Hematology Recent Labs  Lab 12/17/16 0242  12/18/16 0430 12/18/16 1741 12/19/16 0443  WBC 12.2*  --  10.9*  --  9.3  RBC 3.13*  --  3.27*  --  3.37*  HGB 8.6*   < > 8.9* 9.2* 9.1*  HCT 25.7*   < > 27.4* 27.0* 28.1*  MCV 82.1  --  83.8  --  83.4  MCH 27.5  --  27.2  --  27.0  MCHC 33.5  --  32.5  --  32.4  RDW 19.3*  --  18.4*  --  18.5*  PLT 140*  --  162  --  197   < > = values in this interval not displayed.    Cardiac Enzymes Recent Labs  Lab 12/12/16 1856 12/12/16 2301 12/13/16 0554  TROPONINI 2.20* 2.15* 2.02*    Recent Labs  Lab 12/12/16 1812  TROPIPOC 1.60*     BNP Recent Labs  Lab 12/12/16 1857  BNP 458.0*      Radiology    Dg Chest Port 1 View  Result  Date: 12/18/2016 CLINICAL DATA:  61 year old female with a history of CABG EXAM: PORTABLE CHEST 1 VIEW COMPARISON:  12/17/2016, 12/16/2016, 12/15/2016 FINDINGS: Cardiomediastinal silhouette unchanged in comparison with mild widening at the vascular pedicle consistent with recent surgical history. Surgical changes of median sternotomy and CABG. Unchanged position of right upper extremity PICC, bilateral large bore thoracostomy tubes. EKG leads project over the lower chest. No epicardial pacing leads or mediastinal drains are confidently visualized on the current study. Since the prior chest x-ray there is increased opacification at the lung bases with partial obscuration of the hemidiaphragm and heart borders. Interlobular septal thickening present bilaterally. Hazy opacities at the lung bases. No pneumothorax. IMPRESSION: Slight interval worsening of aeration at the lung bases with partial obscuration of the hemidiaphragms on today's study suggesting bibasilar pleural effusions with atelectasis/consolidation. Interlobular septal thickening bilaterally, potentially representing a combination of chronic lung scarring and/ or pulmonary edema. Surgical changes of median sternotomy and CABG. Unchanged bilateral thoracostomy tube and right upper extremity PICC. Electronically Signed   By: Corrie Mckusick D.O.   On: 12/18/2016 09:12    Patient Profile     61 y.o. female admitted with non-ST elevation myocardial infarction. Cardiac catheterization revealed severe coronary disease including distal left main. She was taken emergently for cardiac catheterization. Echocardiogram preoperatively showed ejection fraction 45-50%, mild aortic stenosis, mild to moderate mitral regurgitation and mild left atrial enlargement.   Assessment & Plan    1 coronary artery disease status post coronary artery bypassing graft-continue aspirin, Plavix in the setting of non-ST elevation myocardial infarction; BP borderline; hold coreg;  Coox 49; continue milrinone but will increase to .25. Recheck echo for LV function.  2 ischemic cardiomyopathy-resume low dose coreg and ARB later as BP allows.  3 postoperative  atrial fibrillation-patient remains in sinus rhythm. Continue amiodarone; change dose to 200 mg BID.  4 postoperative volume excess-Weight increased 25 lbs since admission; pt volume overloaded on exam; will resume lasix 40 mg BID.  5 elevated liver functions-further increase today; possibly related to passive congestion; increase milrinone as outlined; hold statin for now; decrease amiodarone; follow.  6 hyponatremia-fluid restrict; follow.  For questions or updates, please contact [place] Please consult [email or web address] for contact info under Cardiology/STEMI.      Signed, Kirk Ruths, MD  12/19/2016, 7:49 AM

## 2016-12-20 ENCOUNTER — Inpatient Hospital Stay (HOSPITAL_COMMUNITY): Payer: BLUE CROSS/BLUE SHIELD

## 2016-12-20 DIAGNOSIS — I5021 Acute systolic (congestive) heart failure: Secondary | ICD-10-CM

## 2016-12-20 DIAGNOSIS — I429 Cardiomyopathy, unspecified: Secondary | ICD-10-CM

## 2016-12-20 LAB — GLUCOSE, CAPILLARY
GLUCOSE-CAPILLARY: 100 mg/dL — AB (ref 65–99)
GLUCOSE-CAPILLARY: 133 mg/dL — AB (ref 65–99)

## 2016-12-20 LAB — COMPREHENSIVE METABOLIC PANEL
ALT: 247 U/L — ABNORMAL HIGH (ref 14–54)
AST: 69 U/L — ABNORMAL HIGH (ref 15–41)
Albumin: 2.8 g/dL — ABNORMAL LOW (ref 3.5–5.0)
Alkaline Phosphatase: 147 U/L — ABNORMAL HIGH (ref 38–126)
Anion gap: 13 (ref 5–15)
BUN: 14 mg/dL (ref 6–20)
CO2: 41 mmol/L — ABNORMAL HIGH (ref 22–32)
Calcium: 8.6 mg/dL — ABNORMAL LOW (ref 8.9–10.3)
Chloride: 80 mmol/L — ABNORMAL LOW (ref 101–111)
Creatinine, Ser: 0.77 mg/dL (ref 0.44–1.00)
GFR calc Af Amer: >60 mL/min (ref >60)
GFR calc non Af Amer: >60 mL/min (ref >60)
Glucose, Bld: 145 mg/dL — ABNORMAL HIGH (ref 65–99)
Potassium: 2.2 mmol/L — CL (ref 3.5–5.1)
Sodium: 134 mmol/L — ABNORMAL LOW (ref 135–145)
Total Bilirubin: 0.9 mg/dL (ref 0.3–1.2)
Total Protein: 5.4 g/dL — ABNORMAL LOW (ref 6.5–8.1)

## 2016-12-20 LAB — POCT I-STAT, CHEM 8
BUN: 21 mg/dL — ABNORMAL HIGH (ref 6–20)
BUN: 12 mg/dL (ref 6–20)
CALCIUM ION: 1.04 mmol/L — AB (ref 1.15–1.40)
CHLORIDE: 75 mmol/L — AB (ref 101–111)
CREATININE: 0.8 mg/dL (ref 0.44–1.00)
CREATININE: 0.7 mg/dL (ref 0.44–1.00)
Calcium, Ion: 1.13 mmol/L — ABNORMAL LOW (ref 1.15–1.40)
Chloride: 83 mmol/L — ABNORMAL LOW (ref 101–111)
GLUCOSE: 197 mg/dL — AB (ref 65–99)
Glucose, Bld: 143 mg/dL — ABNORMAL HIGH (ref 65–99)
HCT: 31 % — ABNORMAL LOW (ref 36.0–46.0)
HEMATOCRIT: 29 % — AB (ref 36.0–46.0)
HEMOGLOBIN: 9.9 g/dL — AB (ref 12.0–15.0)
Hemoglobin: 10.5 g/dL — ABNORMAL LOW (ref 12.0–15.0)
POTASSIUM: 3 mmol/L — AB (ref 3.5–5.1)
Potassium: 4 mmol/L (ref 3.5–5.1)
Sodium: 130 mmol/L — ABNORMAL LOW (ref 135–145)
Sodium: 129 mmol/L — ABNORMAL LOW (ref 135–145)
TCO2: 36 mmol/L — ABNORMAL HIGH (ref 22–32)
TCO2: 40 mmol/L — ABNORMAL HIGH (ref 22–32)

## 2016-12-20 LAB — COOXEMETRY PANEL
Carboxyhemoglobin: 1.3 % (ref 0.5–1.5)
Methemoglobin: 1 % (ref 0.0–1.5)
O2 Saturation: 60.7 %
Total hemoglobin: 9.1 g/dL — ABNORMAL LOW (ref 12.0–16.0)

## 2016-12-20 LAB — POCT I-STAT 4, (NA,K, GLUC, HGB,HCT)
GLUCOSE: 194 mg/dL — AB (ref 65–99)
GLUCOSE: 164 mg/dL — AB (ref 65–99)
Glucose, Bld: 195 mg/dL — ABNORMAL HIGH (ref 65–99)
HCT: 30 % — ABNORMAL LOW (ref 36.0–46.0)
HEMATOCRIT: 29 % — AB (ref 36.0–46.0)
HEMATOCRIT: 28 % — AB (ref 36.0–46.0)
HEMOGLOBIN: 10.2 g/dL — AB (ref 12.0–15.0)
HEMOGLOBIN: 9.5 g/dL — AB (ref 12.0–15.0)
Hemoglobin: 9.9 g/dL — ABNORMAL LOW (ref 12.0–15.0)
POTASSIUM: 3.5 mmol/L (ref 3.5–5.1)
Potassium: 3.6 mmol/L (ref 3.5–5.1)
Potassium: 3.5 mmol/L (ref 3.5–5.1)
SODIUM: 131 mmol/L — AB (ref 135–145)
Sodium: 130 mmol/L — ABNORMAL LOW (ref 135–145)
Sodium: 127 mmol/L — ABNORMAL LOW (ref 135–145)

## 2016-12-20 LAB — ECHOCARDIOGRAM LIMITED
Height: 64 in
Weight: 3178.15 oz

## 2016-12-20 LAB — CBC
HCT: 28.3 % — ABNORMAL LOW (ref 36.0–46.0)
Hemoglobin: 9.1 g/dL — ABNORMAL LOW (ref 12.0–15.0)
MCH: 27.2 pg (ref 26.0–34.0)
MCHC: 32.2 g/dL (ref 30.0–36.0)
MCV: 84.5 fL (ref 78.0–100.0)
Platelets: 242 10*3/uL (ref 150–400)
RBC: 3.35 MIL/uL — ABNORMAL LOW (ref 3.87–5.11)
RDW: 18.6 % — ABNORMAL HIGH (ref 11.5–15.5)
WBC: 10.3 10*3/uL (ref 4.0–10.5)

## 2016-12-20 LAB — HEMOGLOBIN A1C
Hgb A1c MFr Bld: 6 % — ABNORMAL HIGH (ref 4.8–5.6)
Mean Plasma Glucose: 125.5 mg/dL

## 2016-12-20 LAB — BASIC METABOLIC PANEL
Anion gap: 12 (ref 5–15)
BUN: 12 mg/dL (ref 6–20)
CO2: 43 mmol/L — ABNORMAL HIGH (ref 22–32)
Calcium: 8.5 mg/dL — ABNORMAL LOW (ref 8.9–10.3)
Chloride: 76 mmol/L — ABNORMAL LOW (ref 101–111)
Creatinine, Ser: 0.67 mg/dL (ref 0.44–1.00)
GFR calc Af Amer: >60 mL/min (ref >60)
GFR calc non Af Amer: >60 mL/min (ref >60)
Glucose, Bld: 202 mg/dL — ABNORMAL HIGH (ref 65–99)
Potassium: 3.2 mmol/L — ABNORMAL LOW (ref 3.5–5.1)
Sodium: 131 mmol/L — ABNORMAL LOW (ref 135–145)

## 2016-12-20 MED ORDER — SPIRONOLACTONE 25 MG PO TABS
12.5000 mg | ORAL_TABLET | Freq: Every day | ORAL | Status: DC
  Administered 2016-12-20: 12.5 mg via ORAL
  Filled 2016-12-20: qty 1

## 2016-12-20 MED ORDER — LOSARTAN POTASSIUM 25 MG PO TABS
12.5000 mg | ORAL_TABLET | Freq: Every day | ORAL | Status: DC
  Administered 2016-12-20: 12.5 mg via ORAL
  Filled 2016-12-20: qty 1

## 2016-12-20 MED ORDER — POTASSIUM CHLORIDE CRYS ER 20 MEQ PO TBCR
60.0000 meq | EXTENDED_RELEASE_TABLET | Freq: Every day | ORAL | Status: DC
  Administered 2016-12-20: 60 meq via ORAL
  Filled 2016-12-20: qty 3

## 2016-12-20 MED ORDER — MAGNESIUM HYDROXIDE 400 MG/5ML PO SUSP
15.0000 mL | Freq: Every day | ORAL | Status: DC | PRN
  Administered 2016-12-20 – 2016-12-26 (×3): 15 mL via ORAL
  Filled 2016-12-20 (×3): qty 30

## 2016-12-20 MED ORDER — POTASSIUM CHLORIDE CRYS ER 20 MEQ PO TBCR
60.0000 meq | EXTENDED_RELEASE_TABLET | Freq: Once | ORAL | Status: AC
  Administered 2016-12-20: 60 meq via ORAL
  Filled 2016-12-20: qty 3

## 2016-12-20 MED ORDER — DEXTROSE 5 % IV SOLN
250.0000 mg | Freq: Once | INTRAVENOUS | Status: AC
  Administered 2016-12-20: 250 mg via INTRAVENOUS
  Filled 2016-12-20: qty 250

## 2016-12-20 MED ORDER — POTASSIUM CHLORIDE 10 MEQ/50ML IV SOLN
10.0000 meq | INTRAVENOUS | Status: AC
  Administered 2016-12-20 (×3): 10 meq via INTRAVENOUS
  Filled 2016-12-20: qty 50

## 2016-12-20 MED ORDER — POTASSIUM CHLORIDE 10 MEQ/50ML IV SOLN
10.0000 meq | INTRAVENOUS | Status: AC
  Administered 2016-12-20 (×2): 10 meq via INTRAVENOUS
  Filled 2016-12-20: qty 50

## 2016-12-20 MED ORDER — POTASSIUM CHLORIDE 10 MEQ/50ML IV SOLN
INTRAVENOUS | Status: AC
  Filled 2016-12-20: qty 150

## 2016-12-20 MED ORDER — INSULIN ASPART 100 UNIT/ML ~~LOC~~ SOLN: 0.0000 [IU] | Freq: Every day | SUBCUTANEOUS | Status: DC

## 2016-12-20 MED ORDER — INSULIN ASPART 100 UNIT/ML ~~LOC~~ SOLN
0.0000 [IU] | Freq: Three times a day (TID) | SUBCUTANEOUS | Status: DC
  Administered 2016-12-21: 2 [IU] via SUBCUTANEOUS
  Administered 2016-12-22: 3 [IU] via SUBCUTANEOUS
  Administered 2016-12-23: 2 [IU] via SUBCUTANEOUS

## 2016-12-20 MED ORDER — DIGOXIN 125 MCG PO TABS
0.1250 mg | ORAL_TABLET | Freq: Every day | ORAL | Status: DC
  Administered 2016-12-20 – 2016-12-22 (×3): 0.125 mg via ORAL
  Filled 2016-12-20 (×3): qty 1

## 2016-12-20 MED ORDER — POTASSIUM CHLORIDE 10 MEQ/50ML IV SOLN
10.0000 meq | INTRAVENOUS | Status: AC
  Administered 2016-12-20 (×3): 10 meq via INTRAVENOUS

## 2016-12-20 MED ORDER — POTASSIUM CHLORIDE 10 MEQ/50ML IV SOLN
10.0000 meq | INTRAVENOUS | Status: AC
  Administered 2016-12-20 – 2016-12-21 (×3): 10 meq via INTRAVENOUS
  Filled 2016-12-20 (×3): qty 50

## 2016-12-20 MED ORDER — ALPRAZOLAM 0.5 MG PO TABS
0.5000 mg | ORAL_TABLET | Freq: Two times a day (BID) | ORAL | Status: DC | PRN
  Administered 2016-12-20 – 2016-12-27 (×11): 0.5 mg via ORAL
  Filled 2016-12-20 (×11): qty 1

## 2016-12-20 MED ORDER — ACETAZOLAMIDE 250 MG PO TABS
250.0000 mg | ORAL_TABLET | Freq: Two times a day (BID) | ORAL | Status: DC
  Administered 2016-12-20: 250 mg via ORAL
  Filled 2016-12-20 (×2): qty 1

## 2016-12-20 NOTE — Progress Notes (Signed)
CRITICAL VALUE ALERT  Critical Value:  K 2.2  Date & Time Notied:  0530  Provider Notified: KCL replacement protocol initiated.

## 2016-12-20 NOTE — Consult Note (Signed)
Advanced Heart Failure Team Consult Note   Primary Physician: Dr. Woody Seller Primary Cardiologist:  New (Dr. Percival Spanish) HF: New (Dr. Haroldine Laws)  Reason for Consultation: CHF  HPI:    Emily Rocha is seen today for evaluation of CHF at the request of Dr. Prescott Gum.   Emily Rocha is a very pleasant 61 y.o. female with h/o Asthma, COPD, tobacco abuse, HLD, PAD, carotid artery stenosis s/p R CEA, and GERD who presented to [facility] 12/12/16 with worsening dyspnea and CP. She reported chest pain for around 2 months.   Had previously had a normal echo and Stress test in 2017.  CTA negative for PE, ABX started for possible PNA. Troponin peaked at 2.20. EKG changes with ST depression in lateral leads.      CT chest with coronary artery calcification and mild cardiomegaly.   Echo 12/13/16 LVEF 45-50%, Mild stenosis, Mild to Moderate MR, PA peak pressure 47 mm Hg.   LHC 12/14/16 with severe distal left main and mid LAD stenosis. + Chronic occlusion of the ostial circumflex artery with R->L collaterals, and moderately severe calcified stenosis of the mid RCA.  TCTS urgently called for CABG and pt taken emergently with significant disease.   S/p CABG x 3 to LAD, circumflex marginal, and diagonal.   Post operative course has been complicated by volume overload, continued cardiogenic shock, and hypokalemia. HF team asked to see for GDMT and milrinone support.    Feeling much better post op. Urine output picking up and appetite back and strong. Chest remains sore and taking small shallow breaths. Gets Incentive spirometer up to 500 but can't hold if for very long. Denies exertional chest pain. No SOB, lightheadedness, or dizziness; even when walking with PT over the weekend.  She is on Oxygen at home chronically with her COPD.   Negative 3.6 L and down 7 lbs.   Admit Weight 185 -> Post Op Weight 201. (up to 208.)    Pt 198 lbs today. Remains up 13 lbs from pre-op.   LHC 12/14/16   Mid RCA lesion, 60  %stenosed.  Prox RCA lesion, 40 %stenosed.  Ost RPDA to RPDA lesion, 90 %stenosed.  Ost Cx to Prox Cx lesion, 100 %stenosed.  Mid LAD lesion, 80 %stenosed.  Ost 2nd Diag to 2nd Diag lesion, 40 %stenosed.  Prox LAD to Mid LAD lesion, 40 %stenosed.  Ost LM to LM lesion, 99 %stenosed.  Review of Systems: [y] = yes, [ ]  = no   General: Weight gain [ ] ; Weight loss [ ] ; Anorexia [ ] ; Fatigue [ ] ; Fever [ ] ; Chills [ ] ; Weakness [ ]   Cardiac: Chest pain/pressure [y]; Resting SOB [ ] ; Exertional SOB [y]; Orthopnea [ ] ; Pedal Edema [y]; Palpitations [ ] ; Syncope [ ] ; Presyncope [ ] ; Paroxysmal nocturnal dyspnea[ ]   Pulmonary: Cough [y]; Wheezing[y]; Hemoptysis[ ] ; Sputum [ ] ; Snoring [ ]   GI: Vomiting[ ] ; Dysphagia[ ] ; Melena[ ] ; Hematochezia [ ] ; Heartburn[ ] ; Abdominal pain [ ] ; Constipation [ ] ; Diarrhea [ ] ; BRBPR [ ]   GU: Hematuria[ ] ; Dysuria [ ] ; Nocturia[ ]   Vascular: Pain in legs with walking [ ] ; Pain in feet with lying flat [ ] ; Non-healing sores [ ] ; Stroke [ ] ; TIA [ ] ; Slurred speech [ ] ;  Neuro: Headaches[ ] ; Vertigo[ ] ; Seizures[ ] ; Paresthesias[ ] ;Blurred vision [ ] ; Diplopia [ ] ; Vision changes [ ]   Ortho/Skin: Arthritis [y]; Joint pain [y]; Muscle pain [ ] ; Joint swelling [ ] ; Back  Pain [ ] ; Rash [ ]   Psych: Depression[ ] ; Anxiety[ ]   Heme: Bleeding problems [ ] ; Clotting disorders [ ] ; Anemia [ ]   Endocrine: Diabetes [ ] ; Thyroid dysfunction[ ]   Home Medications Prior to Admission medications   Medication Sig Start Date End Date Taking? Authorizing Provider  ALPRAZolam Duanne Moron) 0.5 MG tablet Take 0.5 mg by mouth 2 (two) times daily as needed for anxiety.    Yes [provider]  aspirin EC 81 MG tablet Take 81 mg by mouth at bedtime.    Yes [provider]  atorvastatin (LIPITOR) 10 MG tablet Take 1 tablet (10 mg total) by mouth daily. 12/07/14  Yes Alvia Grove, PA-C  Cholecalciferol (VITAMIN D) 2000 units CAPS Take 2,000 Units by mouth daily.    Yes [provider]  esomeprazole (NEXIUM) 40 MG capsule Take 1 capsule (40 mg total) by mouth 2 (two) times daily before a meal. 12/03/16  Yes Rehman, Mechele Dawley, MD  Multiple Vitamin (MULTIVITAMIN WITH MINERALS) TABS tablet Take 1 tablet by mouth daily.   Yes [provider]  SYMBICORT 160-4.5 MCG/ACT inhaler Inhale 2 puffs into the lungs 2 (two) times daily.  07/29/14  Yes [provider]  VENTOLIN HFA 108 (90 BASE) MCG/ACT inhaler Inhale 2 puffs into the lungs every 6 (six) hours as needed for shortness of breath.  08/26/14  Yes [provider]  Pediatric Multivitamins-Iron (FLINTSTONES PLUS IRON) chewable tablet Chew 1 tablet by mouth 2 (two) times daily. Patient not taking: Reported on 12/12/2016 12/03/16   Rogene Houston, MD   Past Medical History: Past Medical History:  Diagnosis Date  . Asthma   . COPD (chronic obstructive pulmonary disease) ([place])   . GERD (gastroesophageal reflux disease)   . Shortness of breath dyspnea    Past Surgical History: Past Surgical History:  Procedure Laterality Date  . CHOLECYSTECTOMY     Family History: Family History  Problem Relation Age of Onset  . Heart attack Mother   . Cancer Mother   . Diabetes Mother   . Heart disease Mother   . Heart attack Father   . Heart disease Father    Social History: Social History   Socioeconomic History  . Marital status: Divorced    Spouse name: None  . Number of children: None  . Years of education: None  . Highest education level: None  Social Needs  . Financial resource strain: None  . Food insecurity - worry: None  . Food insecurity - inability: None  . Transportation needs - medical: None  . Transportation needs - non-medical: None  Occupational History  . None  Tobacco Use  . Smoking status: Former Smoker    Packs/day: 2.00    Years: 40.00    Pack years: 80.00    Last attempt to quit: 10/03/2010    Years since quitting: 6.2  . Smokeless tobacco:  Never Used  . Tobacco comment: No longer using the Vapor  Substance and Sexual Activity  . Alcohol use: No    Alcohol/week: 0.0 oz  . Drug use: No  . Sexual activity: None  Other Topics Concern  . None  Social History Narrative  . None   Allergies:  No Known Allergies  Objective:    Vital Signs:   Temp:  [98.2 F (36.8 C)-99 F (37.2 C)] 98.4 F (36.9 C) (11/05 0700) Pulse Rate:  [66-106] 104 (11/05 0700) Resp:  [16-36] 21 (11/05 0700) BP: (84-118)/(46-86) 97/60 (11/05 0700) SpO2:  [  91 %-100 %] 97 % (11/05 0822) Weight:  [198 lb 10.2 oz (90.1 kg)] 198 lb 10.2 oz (90.1 kg) (11/05 0500) Last BM Date: (12/13/16)  Weight change: Filed Weights   12/18/16 0500 12/19/16 0500 12/20/16 0500  Weight: 206 lb 5.6 oz (93.6 kg) 205 lb 11 oz (93.3 kg) 198 lb 10.2 oz (90.1 kg)   Intake/Output:   Intake/Output Summary (Last 24 hours) at 12/20/2016 0834 Last data filed at 12/20/2016 0600 Gross per 24 hour  Intake 1849.13 ml  Output 5622 ml  Net -3772.87 ml    Physical Exam    General:  Well appearing. No resp difficulty HEENT: normal O2 via [place].  Neck: supple. JVP 9-10 cm. Carotids 2+ bilat; no bruits. No lymphadenopathy or thyromegaly appreciated. Cor: PMI nondisplaced. Regular rate & rhythm. 2-3/6 MR noted.  Lungs: Diminished, shallow breaths. Mild basilar crackles. + CTs in place. Abdomen: soft, nontender, nondistended. No hepatosplenomegaly. No bruits or masses. Good bowel sounds. Extremities: no cyanosis, clubbing, or rash. 1-2+ ankle edema. Slightly cool to the touch.  Neuro: alert & orientedx3, cranial nerves grossly intact. moves all 4 extremities w/o difficulty. Affect pleasant  Telemetry   Sinus tach 100-110s, personally reviewed  EKG    12/15/16 NSR 69 bpm. Personally reviewed.  Labs   Basic Metabolic Panel: Recent Labs  Lab 12/13/16 1023 12/14/16 0452  12/15/16 0328  12/16/16 0253  12/17/16 0242  12/18/16 0430 12/18/16 1741 12/19/16 0443 12/19/16 1752  12/20/16 0427  NA 135 135   < > 134*   < > 129*   < > 125*   < > 128* 126* 126* 130* 134*  K 4.1 4.1   < > 3.9   < > 4.6   < > 3.5   < > 3.9 4.4 4.0 3.0* 2.2*  CL 98* 96*   < > 104   < > 98*   < > 89*   < > 89* 85* 86* 83* 80*  CO2 26 28  --  24  --  23  --  28  --  30  --  31  --  41*  GLUCOSE 150* 109*   < > 160*   < > 196*   < > 238*   < > 73 76 91 143* 145*  BUN 9 16   < > 15   < > 14   < > 15   < > 24* 29* 24* 21* 14  CREATININE 0.64 0.83   < > 0.73   < > 0.82   < > 0.66   < > 0.76 1.00 0.74 0.80 0.77  CALCIUM 8.7* 9.0  --  8.7*  --  8.2*  --  8.1*  --  8.4*  --  8.5*  --  8.6*  MG 2.3 2.4  --  3.0*  --   --   --   --   --   --   --   --   --   --    < > = values in this interval not displayed.   Liver Function Tests: Recent Labs  Lab 12/17/16 0242 12/18/16 0430 12/19/16 0443 12/20/16 0427  AST 103* 127* 122* 69*  ALT 240* 286* 321* 247*  ALKPHOS 66 103 171* 147*  BILITOT 1.0 0.8 0.9 0.9  PROT 4.6* 5.1* 5.5* 5.4*  ALBUMIN 2.6* 2.6* 2.8* 2.8*   No results for input(s): LIPASE, AMYLASE in the last 168 hours. No results for input(s): AMMONIA in the last  168 hours.  CBC: Recent Labs  Lab 12/16/16 1607 12/17/16 0242  12/18/16 0430 12/18/16 1741 12/19/16 0443 12/19/16 1752 12/20/16 0427  WBC 13.1* 12.2*  --  10.9*  --  9.3  --  10.3  HGB 7.7* 8.6*   < > 8.9* 9.2* 9.1* 9.9* 9.1*  HCT 23.6* 25.7*   < > 27.4* 27.0* 28.1* 29.0* 28.3*  MCV 81.7 82.1  --  83.8  --  83.4  --  84.5  PLT 150 140*  --  162  --  197  --  242   < > = values in this interval not displayed.   Cardiac Enzymes: No results for input(s): CKTOTAL, CKMB, CKMBINDEX, TROPONINI in the last 168 hours.  BNP: BNP (last 3 results) Recent Labs    12/12/16 1857  BNP 458.0*    ProBNP (last 3 results) No results for input(s): PROBNP in the last 8760 hours.  CBG: Recent Labs  Lab 12/19/16 0838 12/19/16 1232 12/19/16 1638 12/19/16 2143 12/20/16 0804  GLUCAP 92 143* 125* 207* 100*    Coagulation  Studies: No results for input(s): LABPROT, INR in the last 72 hours.   Imaging   Dg Chest Port 1 View  Result Date: 12/20/2016 CLINICAL DATA:  Chest pain EXAM: PORTABLE CHEST 1 VIEW COMPARISON:  December 18, 2016 FINDINGS: There is a chest tube on each side. Central catheter tip is in the superior vena cava. No pneumothorax. There is a small right pleural effusion. There is no edema or consolidation. Heart is mildly enlarged but stable. Pulmonary vascularity is within normal limits. There is aortic atherosclerosis. No evident bone lesions. No adenopathy. IMPRESSION: Small right pleural effusion. No edema or consolidation. Stable cardiac prominence. Tube and catheter positions as described without pneumothorax. There is aortic atherosclerosis. Aortic Atherosclerosis (ICD10-I70.0). Electronically Signed   By: Lowella Grip III M.D.   On: 12/20/2016 08:14     Medications:     Current Medications: . amiodarone  200 mg Oral BID  . aspirin EC  81 mg Oral Daily  . bisacodyl  10 mg Oral Daily   Or  . bisacodyl  10 mg Rectal Daily  . clopidogrel  75 mg Oral Daily  . docusate sodium  200 mg Oral Daily  . enoxaparin (LOVENOX) injection  40 mg Subcutaneous Q24H  . ferrous [id number] C-folic acid  1 capsule Oral TID PC  . ipratropium  0.5 mg Nebulization TID  . levalbuterol  1.25 mg Nebulization TID  . mouth rinse  15 mL Mouth Rinse BID  . mometasone-formoterol  2 puff Inhalation BID  . pantoprazole  40 mg Oral Daily  . sodium chloride flush  10-40 mL Intracatheter Q12H  . sodium chloride flush  3 mL Intravenous Q12H     Infusions: . sodium chloride    . sodium chloride    . acetaZOLAMIDE (DIAMOX) IVPB    . furosemide (LASIX) infusion 10 mg/hr (12/19/16 1053)  . lactated ringers Stopped (12/19/16 0918)  . lactated ringers 20 mL/hr at 12/19/16 1800  . milrinone 0.3 mcg/kg/min (12/20/16 0315)  . potassium chloride    . potassium chloride         Patient Profile    Emily Rocha is a 61 y.o. female with h/o Asthma, COPD, tobacco abuse, HLD, PAD, carotid artery stenosis s/p R CEA, and GERD who presented to [facility] 12/12/16 with worsening dyspnea and CP.  Presented with Chest pain -> NSTEMI with no prior known cardiac disease. [place] 12/14/16 with severe  disease. Taken for emergent CABG the same day.  CHF team consulted with difficulty weaning off milrinone and for GDMT.   Assessment/Plan   1. NSTEMI s/p emergent CABG 12/14/16 - Doing well post op.  - Continue IS.  - Worked with PT over the weekend.   2. Acute on chronic systolic CHF due to ischemic cardiomyopathy - Echo 12/13/16 EF 45-50%  - Per Dr. Haroldine Laws Intra-Op TEE appears to have EF 25-30% with Mod/Sev MR.  - NYHA III-IIIb post op but improving.  - Volume status remains elevated on exam. She remains 13 lbs up from pre op.  - Continue lasix 10 mg/hr. Supp K  - Not on guideline directed therapy yet.  Will slowly titrate.  - Continue milrinone 0.25 mcg/kg/min. Coox 60%.  - Add 12.5 mg losartan at bedtime.  - Add spiro 12.5 mg daily at bedtime.  - Add digoxin 0.125 mg daily.  - No BB with low output.  3. CAD s/p CABG as above.  - Statin on hold with elevated LFTs. Can likely resume as recovers - No BB yesterday with low output.  - Continue ASA 4. COPD with chronic hypoxic respiratory failure - Continue home meds and O2 - No wheezing on exam.  5. Post op Afib - Converted with IV amio -> po amio - Continue po amio.  - No AC for now.  6. Hypokalemia - 2.2 this am.  - Will supp with additional 60 meq po and recheck BMET this afternoon.  - 6 runs ordered. She is tolerating po, so will add this as well.  7. Hyponatermia - 134 this am. Continue to follow with diuresis 8. Transaminitis - ? Passive congestion vs cardiogenic shock - Improving. Holding statin for now.   Length of Stay: [address]  Annamaria Helling  12/20/2016, 8:34 AM  Advanced Heart Failure Team Pager [phone] (M-F; 7a -  4p)  Please contact [facility] for night-coverage after hours (4p -7a ) and weekends on [email or web address]  Patient seen and examined with the above-signed Advanced Practice Provider and/or Housestaff. I personally reviewed laboratory data, imaging studies and relevant notes. I independently examined the patient and formulated the important aspects of the plan. I have edited the note to reflect any of my changes or salient points. I have personally discussed the plan with the patient and/or family.  Agree with above. Cath films and intra-op TEE reviewed personally. 61 y/o woman with COPD admitted with NSTEMI and found to have severe (> 95%) LM disease with occluded ostial LCX. Now s/p urgent CABG on 12/14/16. Intra-op TEE with EF ~25-30% with mod-severe MR and mild to moderate AS but improved post revascularization. Has had sluggish post-op course with brief PAF and persistent volume overload. Urine output now starting to improve on milrinone, IV lasix and diamox. Will continue. Await echo from today. We will follow closely but suspect she is close to turning the corner.   Glori Bickers, MD  7:19 PM

## 2016-12-20 NOTE — Progress Notes (Signed)
  Echocardiogram 2D Echocardiogram has been performed.  Emily Rocha 12/20/2016, 1:32 PM

## 2016-12-20 NOTE — Progress Notes (Signed)
Physical Therapy Treatment Patient Details Name: ANOUK CRITZER MRN: [id number] DOB: [birth year] Today's Date: 12/20/2016    History of Present Illness 61 y.o. female admitted with non-ST elevation myocardial infarction. Cardiac catheterization revealed severe coronary disease including distal left main. She was taken emergently for cardiac catheterization. Echocardiogram preoperatively showed ejection fraction 45-50%, mild aortic stenosis, mild to moderate mitral regurgitation and mild left atrial enlargement.  CABG x 3 on 12/15/16.  Pt with  hx of carotid artery stenosis s/p right CEA (12/06/14), former smoker, COPD, GERD, HLD, and PAD.     PT Comments    Pt progressing with mobility, ambulated same distance as last session but was able to do so with RW instead of Eva walker. Fatigued her more quickly at first but was able to go 71' without stopping when she decreased her pace. HR 116 bpm, O2 sats 85% on 2L but came back into 90's with seated rest, BP 115/64. PT will continue to follow.    Follow Up Recommendations  Home health PT;Supervision/Assistance - 24 hour(rec SNF if no 24 hr care)     Equipment Recommendations  Rolling walker with 5" wheels;3in1 (PT)    Recommendations for Other Services       Precautions / Restrictions Precautions Precautions: Sternal;Fall Precaution Comments: Chest tube Restrictions Weight Bearing Restrictions: Yes Other Position/Activity Restrictions: sternal precautions    Mobility  Bed Mobility Overal bed mobility: Needs Assistance Bed Mobility: Rolling;Sidelying to Sit;Sit to Supine Rolling: Min assist Sidelying to sit: Min assist   Sit to supine: Min assist   General bed mobility comments: min A due to sternal precautions, at shoulder with SL to sit and LE's for sit to supine  Transfers Overall transfer level: Needs assistance Equipment used: Rolling walker (2 wheeled) Transfers: Sit to/from Stand Sit to Stand: Min assist          General transfer comment: vc's for holding heart pillow and min A for power up.   Ambulation/Gait Ambulation/Gait assistance: Min assist;+2 safety/equipment Ambulation Distance (Feet): 190 Feet Assistive device: Rolling walker (2 wheeled) Gait Pattern/deviations: Step-through pattern;Trunk flexed Gait velocity: decreased Gait velocity interpretation: Below normal speed for age/gender General Gait Details: pt used RW instead of [place] this time. She began with fast ambulation and fatigued very quickly and needed to sit after 60'. O2 sats down to 84% and HR 116 bpm, BP 116/64. After 50mins seated rest, pt cued to ambulate a but slower and she was able to go 130' before needing to sit again.    Stairs            Wheelchair Mobility    Modified Rankin (Stroke Patients Only)       Balance Overall balance assessment: Needs assistance;History of Falls Sitting-balance support: No upper extremity supported;Feet supported Sitting balance-Leahy Scale: Fair     Standing balance support: Bilateral upper extremity supported;During functional activity Standing balance-Leahy Scale: Poor Standing balance comment: relies on UEs upport.                              Cognition Arousal/Alertness: Awake/alert Behavior During Therapy: WFL for tasks assessed/performed Overall Cognitive Status: Within Functional Limits for tasks assessed                                 General Comments: reminder for sternal precautions as she transitioned between positions  Exercises General Exercises - Lower Extremity Ankle Circles/Pumps: AROM;Both;20 reps;Supine    General Comments        Pertinent Vitals/Pain Pain Assessment: No/denies pain    Home Living                      Prior Function            PT Goals (current goals can now be found in the care plan section) Acute Rehab PT Goals Patient Stated Goal: to go home PT Goal Formulation: With  patient Time For Goal Achievement: 01/01/17 Potential to Achieve Goals: Good Progress towards PT goals: Progressing toward goals    Frequency    Min 3X/week      PT Plan Current plan remains appropriate    Co-evaluation              AM-PAC PT "6 Clicks" Daily Activity  Outcome Measure  Difficulty turning over in bed (including adjusting bedclothes, sheets and blankets)?: Unable Difficulty moving from lying on back to sitting on the side of the bed? : Unable Difficulty sitting down on and standing up from a chair with arms (e.g., wheelchair, bedside commode, etc,.)?: Unable Help needed moving to and from a bed to chair (including a wheelchair)?: A Little Help needed walking in hospital room?: A Little Help needed climbing 3-5 steps with a railing? : A Lot 6 Click Score: 11    End of Session Equipment Utilized During Treatment: Gait belt;Oxygen(2L) Activity Tolerance: Patient limited by fatigue Patient left: in bed;with call bell/phone within reach Nurse Communication: Mobility status PT Visit Diagnosis: Unsteadiness on feet (R26.81);Muscle weakness (generalized) (M62.81);Pain     Time: 1155-2080 PT Time Calculation (min) (ACUTE ONLY): 24 min  Charges:  $Gait Training: 23-37 mins                    G Codes:       Leighton Roach, PT  Acute Rehab Services  [place] 12/20/2016, 1:45 PM

## 2016-12-20 NOTE — Progress Notes (Signed)
CT surgery p.m. Rounds  Patient doing well with diuresis Repeat echocardiogram shows improved LV function now 45% Mitral regurgitation appears improved now mild-moderate Maintaining sinus rhythm Hope to remove right chest tube tomorrow

## 2016-12-20 NOTE — Progress Notes (Addendum)
[facility] DAILY ICU PROGRESS NOTE                   [address]            [place] [address]          [phone]   6 Days Post-Op Procedure(s) (LRB): CORONARY ARTERY BYPASS GRAFTING (CABG) x three , using left internal mammary artery and right leg greater saphenous vein harvested endoscopically (N/A) TRANSESOPHAGEAL ECHOCARDIOGRAM (TEE) (N/A)  Total Length of Stay:  LOS: 8 days   Subjective:  Doing well.  She continues to have some soreness at her chest incision.  + ambulation  No BM yet  Objective: Vital signs in last 24 hours: Temp:  [98.2 F (36.8 C)-99 F (37.2 C)] 98.4 F (36.9 C) (11/05 0700) Pulse Rate:  [66-106] 104 (11/05 0700) Cardiac Rhythm: Normal sinus rhythm (11/05 0400) Resp:  [16-36] 21 (11/05 0700) BP: (84-118)/(46-86) 97/60 (11/05 0700) SpO2:  [91 %-100 %] 91 % (11/05 0700) Weight:  [198 lb 10.2 oz (90.1 kg)] 198 lb 10.2 oz (90.1 kg) (11/05 0500)  Filed Weights   12/18/16 0500 12/19/16 0500 12/20/16 0500  Weight: 206 lb 5.6 oz (93.6 kg) 205 lb 11 oz (93.3 kg) 198 lb 10.2 oz (90.1 kg)    Weight change: -0.9 oz (-3.2 kg)   Intake/Output from previous day: 11/04 0701 - 11/05 0700 In: 1978.7 [P.O.:840; I.V.:838.7; IV Piggyback:300] Out: 7169 [Urine:5550; Chest Tube:122]  Current Meds: Scheduled Meds: . amiodarone  200 mg Oral BID  . aspirin EC  81 mg Oral Daily  . bisacodyl  10 mg Oral Daily   Or  . bisacodyl  10 mg Rectal Daily  . clopidogrel  75 mg Oral Daily  . docusate sodium  200 mg Oral Daily  . enoxaparin (LOVENOX) injection  40 mg Subcutaneous Q24H  . ferrous [id number] C-folic acid  1 capsule Oral TID PC  . insulin aspart  0-24 Units Subcutaneous TID AC & HS  . insulin detemir  25 Units Subcutaneous Daily  . ipratropium  0.5 mg Nebulization TID  . levalbuterol  1.25 mg Nebulization TID  . mouth rinse  15 mL Mouth Rinse BID  . mometasone-formoterol  2 puff Inhalation BID  . pantoprazole  40 mg Oral Daily  .  sodium chloride flush  10-40 mL Intracatheter Q12H  . sodium chloride flush  3 mL Intravenous Q12H   Continuous Infusions: . sodium chloride    . sodium chloride    . acetaZOLAMIDE (DIAMOX) IVPB    . furosemide (LASIX) infusion 10 mg/hr (12/19/16 1053)  . lactated ringers Stopped (12/19/16 0918)  . lactated ringers 20 mL/hr at 12/19/16 1800  . milrinone 0.3 mcg/kg/min (12/20/16 0315)  . potassium chloride 10 mEq (12/20/16 0802)  . potassium chloride    . potassium chloride     PRN Meds:.fentaNYL (SUBLIMAZE) injection, Influenza vac split quadrivalent PF, levalbuterol, magnesium hydroxide, metoprolol tartrate, ondansetron (ZOFRAN) IV, oxyCODONE **OR** oxyCODONE, oxyCODONE, sodium chloride flush, sodium chloride flush, traMADol  General appearance: alert, cooperative and no distress Heart: regular rate and rhythm and tachy Lungs: diminished breath sounds bibasilar Abdomen: soft, non-tender; bowel sounds normal; no masses,  no organomegaly Extremities: edema trace Wound: clean and dry, ecchymosis of RLE, and some portions of sternotomy  Lab Results: CBC: Recent Labs    12/19/16 0443 12/20/16 0427  WBC 9.3 10.3  HGB 9.1* 9.1*  HCT 28.1* 28.3*  PLT 197 242   BMET:  Recent Labs  12/19/16 0443 12/20/16 0427  NA 126* 134*  K 4.0 2.2*  CL 86* 80*  CO2 31 41*  GLUCOSE 91 145*  BUN 24* 14  CREATININE 0.74 0.77  CALCIUM 8.5* 8.6*    CMET: Lab Results  Component Value Date   WBC 10.3 12/20/2016   HGB 9.1 (L) 12/20/2016   HCT 28.3 (L) 12/20/2016   PLT 242 12/20/2016   GLUCOSE 145 (H) 12/20/2016   CHOL 205 (H) 12/13/2016   TRIG 101 12/13/2016   HDL 68 12/13/2016   LDLCALC 117 (H) 12/13/2016   ALT 247 (H) 12/20/2016   AST 69 (H) 12/20/2016   NA 134 (L) 12/20/2016   K 2.2 (LL) 12/20/2016   CL 80 (L) 12/20/2016   CREATININE 0.77 12/20/2016   BUN 14 12/20/2016   CO2 41 (H) 12/20/2016   INR 1.40 12/14/2016      PT/INR: No results for input(s): LABPROT, INR in  the last 72 hours. Radiology: No results found.   Assessment/Plan: S/P Procedure(s) (LRB): CORONARY ARTERY BYPASS GRAFTING (CABG) x three , using left internal mammary artery and right leg greater saphenous vein harvested endoscopically (N/A) TRANSESOPHAGEAL ECHOCARDIOGRAM (TEE) (N/A)  1. CV- Sinus Tachy, placed back on Milrinone yesterday, currently at 0.3- Co-ox is 60.7% this morning- weaning per Dr. Prescott Gum, on Amiodarone for previous A. Fib.. BP remains labile  2. Pulm- wean oxygen as tolerated, CXR with small bilateral effusions, atelectasis... Chest tubes in place without air leak.. Output is minimal, possibly d/c one chest tube today 3. Renal- creatinine stable, ... Currently on lasix drip, weight is trending down, edema is mild.. Continue as BP tolerates 4. Hypokalemia- supplementation per ICU protocol 5. Hyponatremia- mild, need to monitor as on Lasix drip 6. Expected post operative anemia- Hgb 9.1 7. D/C SSIP, CBGS, patient is not a diabetic 8. Dispo- patient stable, remains on milrinone with CO-OX at 60.7%, diuresing on Lasix drip.. Need to watch sodium as this is low, supplement K, continue current care     [name], ERIN 12/20/2016 8:16 AM    Repeat echo pending Ischemic CM with elevated preop LVEDP > 35, on postop iv milrenone Will need adv HF eval for best meds patient examined and medical record reviewed,agree with above note. Tharon Aquas Trigt III 12/20/2016

## 2016-12-21 ENCOUNTER — Inpatient Hospital Stay (HOSPITAL_COMMUNITY): Payer: BLUE CROSS/BLUE SHIELD

## 2016-12-21 LAB — POCT I-STAT, CHEM 8
BUN: 13 mg/dL (ref 6–20)
BUN: 12 mg/dL (ref 6–20)
CHLORIDE: 75 mmol/L — AB (ref 101–111)
CREATININE: 0.8 mg/dL (ref 0.44–1.00)
Calcium, Ion: 1.09 mmol/L — ABNORMAL LOW (ref 1.15–1.40)
Calcium, Ion: 1.16 mmol/L (ref 1.15–1.40)
Chloride: 89 mmol/L — ABNORMAL LOW (ref 101–111)
Creatinine, Ser: 0.7 mg/dL (ref 0.44–1.00)
GLUCOSE: 145 mg/dL — AB (ref 65–99)
Glucose, Bld: 215 mg/dL — ABNORMAL HIGH (ref 65–99)
HEMATOCRIT: 28 % — AB (ref 36.0–46.0)
HEMATOCRIT: 26 % — AB (ref 36.0–46.0)
HEMOGLOBIN: 8.8 g/dL — AB (ref 12.0–15.0)
Hemoglobin: 9.5 g/dL — ABNORMAL LOW (ref 12.0–15.0)
POTASSIUM: 3.7 mmol/L (ref 3.5–5.1)
POTASSIUM: 3.3 mmol/L — AB (ref 3.5–5.1)
Sodium: 126 mmol/L — ABNORMAL LOW (ref 135–145)
Sodium: 132 mmol/L — ABNORMAL LOW (ref 135–145)
TCO2: 41 mmol/L — ABNORMAL HIGH (ref 22–32)
TCO2: 31 mmol/L (ref 22–32)

## 2016-12-21 LAB — GLUCOSE, CAPILLARY
GLUCOSE-CAPILLARY: 138 mg/dL — AB (ref 65–99)
GLUCOSE-CAPILLARY: 138 mg/dL — AB (ref 65–99)
Glucose-Capillary: 106 mg/dL — ABNORMAL HIGH (ref 65–99)

## 2016-12-21 LAB — POCT I-STAT 4, (NA,K, GLUC, HGB,HCT)
GLUCOSE: 131 mg/dL — AB (ref 65–99)
Glucose, Bld: 183 mg/dL — ABNORMAL HIGH (ref 65–99)
HCT: 21 % — ABNORMAL LOW (ref 36.0–46.0)
HCT: 26 % — ABNORMAL LOW (ref 36.0–46.0)
HEMOGLOBIN: 7.1 g/dL — AB (ref 12.0–15.0)
Hemoglobin: 8.8 g/dL — ABNORMAL LOW (ref 12.0–15.0)
Potassium: 4.4 mmol/L (ref 3.5–5.1)
Potassium: 5.1 mmol/L (ref 3.5–5.1)
Sodium: 131 mmol/L — ABNORMAL LOW (ref 135–145)
Sodium: 132 mmol/L — ABNORMAL LOW (ref 135–145)

## 2016-12-21 LAB — BASIC METABOLIC PANEL
Anion gap: 10 (ref 5–15)
Anion gap: 7 (ref 5–15)
BUN: 12 mg/dL (ref 6–20)
BUN: 12 mg/dL (ref 6–20)
CO2: 41 mmol/L — ABNORMAL HIGH (ref 22–32)
CO2: 35 mmol/L — ABNORMAL HIGH (ref 22–32)
Calcium: 8.3 mg/dL — ABNORMAL LOW (ref 8.9–10.3)
Calcium: 8.5 mg/dL — ABNORMAL LOW (ref 8.9–10.3)
Chloride: 77 mmol/L — ABNORMAL LOW (ref 101–111)
Chloride: 89 mmol/L — ABNORMAL LOW (ref 101–111)
Creatinine, Ser: 0.78 mg/dL (ref 0.44–1.00)
Creatinine, Ser: 0.66 mg/dL (ref 0.44–1.00)
GFR calc Af Amer: >60 mL/min (ref >60)
GFR calc Af Amer: >60 mL/min (ref >60)
GFR calc non Af Amer: >60 mL/min (ref >60)
GFR calc non Af Amer: >60 mL/min (ref >60)
Glucose, Bld: 216 mg/dL — ABNORMAL HIGH (ref 65–99)
Glucose, Bld: 111 mg/dL — ABNORMAL HIGH (ref 65–99)
Potassium: 3.8 mmol/L (ref 3.5–5.1)
Potassium: 3.6 mmol/L (ref 3.5–5.1)
Sodium: 128 mmol/L — ABNORMAL LOW (ref 135–145)
Sodium: 131 mmol/L — ABNORMAL LOW (ref 135–145)

## 2016-12-21 LAB — CBC
HCT: 28.4 % — ABNORMAL LOW (ref 36.0–46.0)
Hemoglobin: 8.9 g/dL — ABNORMAL LOW (ref 12.0–15.0)
MCH: 27 pg (ref 26.0–34.0)
MCHC: 31.3 g/dL (ref 30.0–36.0)
MCV: 86.1 fL (ref 78.0–100.0)
Platelets: 260 10*3/uL (ref 150–400)
RBC: 3.3 MIL/uL — ABNORMAL LOW (ref 3.87–5.11)
RDW: 18.8 % — ABNORMAL HIGH (ref 11.5–15.5)
WBC: 11.6 10*3/uL — ABNORMAL HIGH (ref 4.0–10.5)

## 2016-12-21 LAB — COOXEMETRY PANEL
Carboxyhemoglobin: 1.6 % — ABNORMAL HIGH (ref 0.5–1.5)
Methemoglobin: 0.6 % (ref 0.0–1.5)
O2 Saturation: 82.6 %
Total hemoglobin: 8.9 g/dL — ABNORMAL LOW (ref 12.0–16.0)

## 2016-12-21 LAB — MAGNESIUM: Magnesium: 1.8 mg/dL (ref 1.7–2.4)

## 2016-12-21 MED ORDER — POTASSIUM CHLORIDE 10 MEQ/50ML IV SOLN
10.0000 meq | INTRAVENOUS | Status: AC
  Administered 2016-12-21 (×3): 10 meq via INTRAVENOUS
  Filled 2016-12-21: qty 50

## 2016-12-21 MED ORDER — AMIODARONE IV BOLUS ONLY 150 MG/100ML: 150.0000 mg | Freq: Once | INTRAVENOUS | Status: DC

## 2016-12-21 MED ORDER — DOPAMINE-DEXTROSE 3.2-5 MG/ML-% IV SOLN
0.0000 ug/kg/min | INTRAVENOUS | Status: DC
  Administered 2016-12-21: 2 ug/kg/min via INTRAVENOUS

## 2016-12-21 MED ORDER — SODIUM CHLORIDE 0.9 % IV SOLN
0.0000 ug/min | INTRAVENOUS | Status: DC
  Administered 2016-12-21: 70 ug/min via INTRAVENOUS
  Administered 2016-12-21: 30 ug/min via INTRAVENOUS
  Administered 2016-12-21: 20 ug/min via INTRAVENOUS
  Filled 2016-12-21 (×4): qty 1

## 2016-12-21 MED ORDER — POTASSIUM CHLORIDE 10 MEQ/100ML IV SOLN
10.0000 meq | INTRAVENOUS | Status: AC
  Administered 2016-12-21 (×3): 10 meq via INTRAVENOUS
  Filled 2016-12-21 (×3): qty 100

## 2016-12-21 MED ORDER — MAGNESIUM SULFATE 2 GM/50ML IV SOLN
2.0000 g | Freq: Once | INTRAVENOUS | Status: AC
  Administered 2016-12-21: 2 g via INTRAVENOUS
  Filled 2016-12-21: qty 50

## 2016-12-21 MED ORDER — ALBUMIN HUMAN 5 % IV SOLN
12.5000 g | Freq: Once | INTRAVENOUS | Status: AC
  Administered 2016-12-21: 12.5 g via INTRAVENOUS

## 2016-12-21 MED ORDER — SODIUM CHLORIDE 0.9 % IV SOLN
0.0000 ug/min | INTRAVENOUS | Status: DC
  Administered 2016-12-21: 15 ug/min via INTRAVENOUS
  Filled 2016-12-21: qty 4

## 2016-12-21 MED ORDER — AMIODARONE HCL IN DEXTROSE 360-4.14 MG/200ML-% IV SOLN
INTRAVENOUS | Status: AC
  Filled 2016-12-21: qty 200

## 2016-12-21 MED ORDER — CALCIUM CHLORIDE 10 % IV SOLN
1.0000 g | Freq: Once | INTRAVENOUS | Status: AC
  Administered 2016-12-21: 1 g via INTRAVENOUS

## 2016-12-21 MED ORDER — POTASSIUM CHLORIDE CRYS ER 20 MEQ PO TBCR
40.0000 meq | EXTENDED_RELEASE_TABLET | Freq: Every day | ORAL | Status: DC
Start: 2016-12-21 — End: 2016-12-21
  Administered 2016-12-21: 40 meq via ORAL
  Filled 2016-12-21: qty 2

## 2016-12-21 MED ORDER — DOPAMINE-DEXTROSE 3.2-5 MG/ML-% IV SOLN
INTRAVENOUS | Status: AC
  Filled 2016-12-21: qty 250

## 2016-12-21 MED ORDER — AMIODARONE LOAD VIA INFUSION
150.0000 mg | Freq: Once | INTRAVENOUS | Status: AC
  Administered 2016-12-21: 150 mg via INTRAVENOUS

## 2016-12-21 MED ORDER — ALBUMIN HUMAN 5 % IV SOLN
INTRAVENOUS | Status: AC
  Filled 2016-12-21: qty 250

## 2016-12-21 NOTE — Progress Notes (Signed)
Advanced Heart Failure Rounding Note  PCP: Dr. Woody Seller Primary Cardiologist: New (Dr. Percival Spanish) HF: New (Dr. Haroldine Laws)  Subjective:    Developed Afib overnight. Converted with IV amio bolus.  Massive diuresis overnight. IV lasix stopped with hypotension and started on Neosynephrine at 30.   Feeling tired this am. Remains on Neo, pressures soft in 90s. On LR at [facility] rate.  Negative 2.5 L and down another 12 lbs. Creatinine stable and potassium improved with aggressive supp 12/20/16. Likely over-diuresed.   Echo 12/20/16 LVEF 45-50%, Mild/Mod AS  Objective:   Weight Range: 186 lb 1.1 oz (84.4 kg) Body mass index is 31.94 kg/m.   Vital Signs:   Temp:  [97.6 F (36.4 C)-99 F (37.2 C)] 97.6 F (36.4 C) (11/06 0000) Pulse Rate:  [70-157] 88 (11/06 0715) Resp:  [14-36] 19 (11/06 0715) BP: (62-114)/(34-74) 92/58 (11/06 0715) SpO2:  [90 %-100 %] 99 % (11/06 0715) Weight:  [186 lb 1.1 oz (84.4 kg)] 186 lb 1.1 oz (84.4 kg) (11/06 0334) Last BM Date: 12/20/16  Weight change: Filed Weights   12/19/16 0500 12/20/16 0500 12/21/16 0334  Weight: 205 lb 11 oz (93.3 kg) 198 lb 10.2 oz (90.1 kg) 186 lb 1.1 oz (84.4 kg)    Intake/Output:   Intake/Output Summary (Last 24 hours) at 12/21/2016 0739 Last data filed at 12/21/2016 0653 Gross per 24 hour  Intake 2628.18 ml  Output 5135 ml  Net -2506.82 ml    Physical Exam    General:  Fatigued appearing. No resp difficulty HEENT: Normal. On O2 via [place] Neck: Supple. JVP 6-7 cm. Carotids 2+ bilat; no bruits. No lymphadenopathy or thyromegaly appreciated. Cor: PMI nondisplaced. Regular rate & rhythm. 2/6 MR Lungs: Mild basilar crackles but improved. CT in place. Abdomen: Soft, nontender, nondistended. No hepatosplenomegaly. No bruits or masses. Good bowel sounds. Extremities: No cyanosis, clubbing, or rash. Trace ankle edema.  Neuro: Alert & orientedx3, cranial nerves grossly intact. moves all 4 extremities w/o difficulty. Affect  pleasant  Telemetry   Sinus tach 100s, Personally reviewed. Afib overnight that converted with amiodarone bolus.   EKG    12/15/16 NSR 69 bpm, Personally reviewed  Labs    CBC Recent Labs    12/20/16 0427  12/21/16 0331 12/21/16 0332  WBC 10.3  --   --  11.6*  HGB 9.1*   < > 9.5* 8.9*  HCT 28.3*   < > 28.0* 28.4*  MCV 84.5  --   --  86.1  PLT 242  --   --  260   < > = values in this interval not displayed.   Basic Metabolic Panel Recent Labs    12/20/16 1350  12/21/16 0331 12/21/16 0332  NA 131*   < > 126* 128*  K 3.2*   < > 3.7 3.8  CL 76*   < > 75* 77*  CO2 43*  --   --  41*  GLUCOSE 202*   < > 215* 216*  BUN 12   < > 13 12  CREATININE 0.67   < > 0.80 0.78  CALCIUM 8.5*  --   --  8.3*  MG  --   --   --  1.8   < > = values in this interval not displayed.   Liver Function Tests Recent Labs    12/19/16 0443 12/20/16 0427  AST 122* 69*  ALT 321* 247*  ALKPHOS 171* 147*  BILITOT 0.9 0.9  PROT 5.5* 5.4*  ALBUMIN 2.8* 2.8*  No results for input(s): LIPASE, AMYLASE in the last 72 hours. Cardiac Enzymes No results for input(s): CKTOTAL, CKMB, CKMBINDEX, TROPONINI in the last 72 hours.  BNP: BNP (last 3 results) Recent Labs    12/12/16 1857  BNP 458.0*    ProBNP (last 3 results) No results for input(s): PROBNP in the last 8760 hours.   D-Dimer No results for input(s): DDIMER in the last 72 hours. Hemoglobin A1C Recent Labs    12/20/16 1504  HGBA1C 6.0*   Fasting Lipid Panel No results for input(s): CHOL, HDL, LDLCALC, TRIG, CHOLHDL, LDLDIRECT in the last 72 hours. Thyroid Function Tests No results for input(s): TSH, T4TOTAL, T3FREE, THYROIDAB in the last 72 hours.  Invalid input(s): FREET3  Other results:   Imaging    No results found.  Medications:     Scheduled Medications: . acetaZOLAMIDE  250 mg Oral BID  . amiodarone  200 mg Oral BID  . aspirin EC  81 mg Oral Daily  . bisacodyl  10 mg Oral Daily   Or  . bisacodyl  10  mg Rectal Daily  . clopidogrel  75 mg Oral Daily  . digoxin  0.125 mg Oral Daily  . docusate sodium  200 mg Oral Daily  . enoxaparin (LOVENOX) injection  40 mg Subcutaneous Q24H  . ferrous [id number] C-folic acid  1 capsule Oral TID PC  . insulin aspart  0-15 Units Subcutaneous TID WC  . insulin aspart  0-5 Units Subcutaneous QHS  . ipratropium  0.5 mg Nebulization TID  . levalbuterol  1.25 mg Nebulization TID  . losartan  12.5 mg Oral QHS  . mouth rinse  15 mL Mouth Rinse BID  . mometasone-formoterol  2 puff Inhalation BID  . pantoprazole  40 mg Oral Daily  . potassium chloride  60 mEq Oral Daily  . sodium chloride flush  10-40 mL Intracatheter Q12H  . sodium chloride flush  3 mL Intravenous Q12H  . spironolactone  12.5 mg Oral QHS     Infusions: . sodium chloride    . sodium chloride    . DOPamine Stopped (12/21/16 0510)  . furosemide (LASIX) infusion Stopped (12/21/16 0410)  . lactated ringers Stopped (12/19/16 0918)  . lactated ringers 20 mL/hr at 12/19/16 1800  . magnesium sulfate 1 - 4 g bolus IVPB    . milrinone 0.3 mcg/kg/min (12/21/16 0600)  . phenylephrine (NEO-SYNEPHRINE) Adult infusion 30 mcg/min (12/21/16 0732)  . potassium chloride 10 mEq (12/21/16 0625)     PRN Medications:  ALPRAZolam, fentaNYL (SUBLIMAZE) injection, Influenza vac split quadrivalent PF, levalbuterol, magnesium hydroxide, metoprolol tartrate, ondansetron (ZOFRAN) IV, oxyCODONE **OR** oxyCODONE, oxyCODONE, sodium chloride flush, sodium chloride flush, traMADol    Patient Profile   BECKETT HICKMON is a 61 y.o. female with h/o Asthma, COPD, tobacco abuse, HLD, PAD, carotid artery stenosis s/p R CEA, and GERD who presented to [facility] 12/12/16 with worsening dyspnea and CP.  Presented with Chest pain -> NSTEMI with no prior known cardiac disease. [place] 12/14/16 with severe disease. Taken for emergent CABG the same day.  CHF team consulted with difficulty weaning off milrinone and for GDMT.    Assessment/Plan   1. NSTEMI s/p emergent CABG 12/14/16 - Doing well post op.  - Continue IS.  - Continue PT.  2. Acute on chronic systolic CHF due to ischemic cardiomyopathy - Echo 12/13/16 EF 45-50%  - Per Dr. Haroldine Laws Intra-Op TEE appears to have EF 25-30% with Mod/Sev MR.  - NYHA III-IIIb post op but  improving.  - Volume status much improved. She is back to pre-op weight on lasix gtt, but hypotensive so likely over diuresed - Agree with holding lasix today and giving very gently IVF - Hold diamox.  - Coox 82.6% on milrinone 0.25 mcg/kg/min. With hypotension and over-diuresis will continue today.  - Continue losartan 12.5 mg at bedtime for now.  - Continue spiro 12.5 mg daily at bedtime for now.  - Continue digoxin 0.125 mg daily.  - No BB with low output.  3. CAD s/p CABG as above.  - Statin on hold with elevated LFTs. Can likely resume as recovers - No BB with low output.  - Continue ASA 4. COPD with chronic hypoxic respiratory failure - Continue home meds and O2 - No wheezing on exam.  5. Post op Afib - Converted with IV amio -> po amio - Continue po amio.  - Went back into Afib overnight but converted again with IV amio. Milrinone likely exacerbating as well.  - No AC for now. No change.  6. Hypokalemia - Much improved after aggressive supp yesterday.  - Continue to supp.  7. Hyponatermia - 130 -> 128 this am.  - Free water restrict.  8. Transaminitis - ? Passive congestion vs cardiogenic shock - Improving. Holding statin for now.  9. Hypomagnesemia - Agree with Mg supp.   Improving but now over-diuresed. No further lasix today. Stop diamox. Continue milrinone at current dose today. Start wean tomorrow if stable.   Length of Stay: [phone]  Annamaria Helling  12/21/2016, 7:39 AM  Advanced Heart Failure Team Pager [phone] (M-F; 7a - 4p)  Please contact [facility] for night-coverage after hours (4p -7a ) and weekends on [email or web address]  Patient seen  and examined with the above-signed Advanced Practice Provider and/or Housestaff. I personally reviewed laboratory data, imaging studies and relevant notes. I independently examined the patient and formulated the important aspects of the plan. I have edited the note to reflect any of my changes or salient points. I have personally discussed the plan with the patient and/or family.   Massive diuresis overnight. Down 12 pounds. Developed hypotension and AF. Given IVF and amio. Now back in NSR. BP better. Co-ox ok. Continue milrinone. Hold IV lasix today. Consider oral lasix tomorrow. Echo with EF 45-50% (reviewed personally) RV ok. Continue amio. No need for [facility] at this point. Mobilize. D/w Dr. Prescott Gum at bedside.   Glori Bickers, MD  9:06 AM

## 2016-12-21 NOTE — Progress Notes (Signed)
      [address]       [place] [address]             [phone]   POD # 7 CABG x 3   Remains on milrinone, neo down to 10  BP 98/64   Pulse 90   Temp 98.6 F (37 C) (Oral)   Resp (!) 33   Ht 5\' 4"  (1.626 m)   Wt 186 lb 1.1 oz (84.4 kg)   SpO2 97%   BMI 31.94 kg/m   Intake/Output Summary (Last 24 hours) at 12/21/2016 1757 Last data filed at 12/21/2016 1700 Gross per 24 hour  Intake 3100.23 ml  Output 3000 ml  Net 100.23 ml   Continue present care  Remo Lipps C. [name], MD [facility] [phone]

## 2016-12-21 NOTE — Progress Notes (Signed)
7 Days Post-Op Procedure(s) (LRB): CORONARY ARTERY BYPASS GRAFTING (CABG) x three , using left internal mammary artery and right leg greater saphenous vein harvested endoscopically (N/A) TRANSESOPHAGEAL ECHOCARDIOGRAM (TEE) (N/A) Subjective: Low BP after excellent diuresis with ;asix drip- now off Remains on milrinone Objective: Vital signs in last 24 hours: Temp:  [97.6 F (36.4 C)-99 F (37.2 C)] 97.9 F (36.6 C) (11/06 1215) Pulse Rate:  [70-157] 94 (11/06 1545) Cardiac Rhythm: Normal sinus rhythm (11/06 1200) Resp:  [12-34] 23 (11/06 1545) BP: (62-115)/(34-75) 103/57 (11/06 1545) SpO2:  [91 %-100 %] 96 % (11/06 1545) FiO2 (%):  [32 %] 32 % (11/06 1309) Weight:  [186 lb 1.1 oz (84.4 kg)] 186 lb 1.1 oz (84.4 kg) (11/06 0334)  Hemodynamic parameters for last 24 hours: CVP:  [4 mmHg] 4 mmHg  Intake/Output from previous day: 11/05 0701 - 11/06 0700 In: 2784.3 [P.O.:720; I.V.:1114.3; IV Piggyback:950] Out: 1610 [Urine:5125; Chest Tube:10] Intake/Output this shift: Total I/O In: 1475.7 [P.O.:360; I.V.:1065.7; IV Piggyback:50] Out: 1425 [Urine:1425]       Exam    General- alert and comfortable   Lungs- clear without rales, wheezes   Cor- regular rate and rhythm, no murmur , gallop   Abdomen- soft, non-tender   Extremities - warm, non-tender, minimal edema   Neuro- oriented, appropriate, no focal weakness   Lab Results: Recent Labs    12/20/16 0427  12/21/16 0332 12/21/16 1207 12/21/16 1214  WBC 10.3  --  11.6*  --   --   HGB 9.1*   < > 8.9* 8.5* 7.1*  HCT 28.3*   < > 28.4* 25.0* 21.0*  PLT 242  --  260  --   --    < > = values in this interval not displayed.   BMET:  Recent Labs    12/20/16 1350  12/21/16 0331 12/21/16 0332 12/21/16 1207 12/21/16 1214  NA 131*   < > 126* 128* 127* 132*  K 3.2*   < > 3.7 3.8 7.3* 5.1  CL 76*   < > 75* 77*  --   --   CO2 43*  --   --  41*  --   --   GLUCOSE 202*   < > 215* 216* 165* 183*  BUN 12   < > 13 12  --   --    CREATININE 0.67   < > 0.80 0.78  --   --   CALCIUM 8.5*  --   --  8.3*  --   --    < > = values in this interval not displayed.    PT/INR: No results for input(s): LABPROT, INR in the last 72 hours. ABG    Component Value Date/Time   PHART 7.421 12/16/2016 0306   HCO3 26.7 12/16/2016 0306   TCO2 41 (H) 12/21/2016 0331   O2SAT 82.6 12/21/2016 0335   CBG (last 3)  Recent Labs    12/20/16 2145 12/21/16 0813 12/21/16 1220  GLUCAP 133* 106* 138*    Assessment/Plan: S/P Procedure(s) (LRB): CORONARY ARTERY BYPASS GRAFTING (CABG) x three , using left internal mammary artery and right leg greater saphenous vein harvested endoscopically (N/A) TRANSESOPHAGEAL ECHOCARDIOGRAM (TEE) (N/A) Mobilize Diabetes control cont milrinone per HF svc   LOS: 9 days    Emily Rocha 12/21/2016

## 2016-12-21 NOTE — Progress Notes (Signed)
Physical Therapy Treatment Patient Details Name: Emily Rocha MRN: [id number] DOB: [birth year] Today's Date: 12/21/2016    History of Present Illness 61 y.o. female admitted with non-ST elevation myocardial infarction. Cardiac catheterization revealed severe coronary disease including distal left main. She was taken emergently for cardiac catheterization. Echocardiogram preoperatively showed ejection fraction 45-50%, mild aortic stenosis, mild to moderate mitral regurgitation and mild left atrial enlargement.  CABG x 3 on 12/15/16.  Pt with  hx of carotid artery stenosis s/p right CEA (12/06/14), former smoker, COPD, GERD, HLD, and PAD.     PT Comments    Pt able to ambulate with RW at a decent pace today. HR-108/57mmHg at beginning of session and 99/16mmHg at end of session. Pt was able to state all sternal precautions. Required 3 rest breaks during ambulation and cueing for control of walker. Pt will continue to benefit with PT. Continue to work on gait and transfers in regards to sternal precautions next session.   Follow Up Recommendations  Home health PT;Supervision/Assistance - 24 hour     Equipment Recommendations  Rolling walker with 5" wheels;3in1 (PT)    Recommendations for Other Services       Precautions / Restrictions Precautions Precautions: Sternal;Fall Restrictions Weight Bearing Restrictions: Yes Other Position/Activity Restrictions: sternal precautions    Mobility  Bed Mobility Overal bed mobility: Needs Assistance     Sidelying to sit: Min assist       General bed mobility comments: min A due to sternal precautions, at shoulder with SL to sit and LE's for sit to supine  Transfers Overall transfer level: Needs assistance Equipment used: Rolling walker (2 wheeled) Transfers: Sit to/from Stand Sit to Stand: Min assist         General transfer comment: vc's for holding heart pillow and min A for power up.   Ambulation/Gait Ambulation/Gait assistance:  Min guard;+2 safety/equipment Ambulation Distance (Feet): 190 Feet Assistive device: Rolling walker (2 wheeled) Gait Pattern/deviations: Step-through pattern;Trunk flexed Gait velocity: decreased   General Gait Details: Pt used RW during gait. Reports of fatigue; 3 rest breaks. Cues for controlling RW and not pushing through arms while ambulating.    Stairs            Wheelchair Mobility    Modified Rankin (Stroke Patients Only)       Balance Overall balance assessment: Needs assistance;History of Falls Sitting-balance support: No upper extremity supported;Feet supported Sitting balance-Leahy Scale: Fair Sitting balance - Comments: min guard for safety   Standing balance support: Bilateral upper extremity supported;During functional activity Standing balance-Leahy Scale: Poor Standing balance comment: relies on UE support                              Cognition Arousal/Alertness: Awake/alert Behavior During Therapy: WFL for tasks assessed/performed Overall Cognitive Status: Within Functional Limits for tasks assessed                                 General Comments: Pt able to state sternal precautions      Exercises      General Comments        Pertinent Vitals/Pain Pain Assessment: No/denies pain    Home Living                      Prior Function  PT Goals (current goals can now be found in the care plan section) Acute Rehab PT Goals Patient Stated Goal: did not discuss Progress towards PT goals: Progressing toward goals    Frequency    Min 3X/week      PT Plan Current plan remains appropriate    Co-evaluation              AM-PAC PT "6 Clicks" Daily Activity  Outcome Measure  Difficulty turning over in bed (including adjusting bedclothes, sheets and blankets)?: Unable Difficulty moving from lying on back to sitting on the side of the bed? : Unable Difficulty sitting down on and standing  up from a chair with arms (e.g., wheelchair, bedside commode, etc,.)?: Unable Help needed moving to and from a bed to chair (including a wheelchair)?: A Little Help needed walking in hospital room?: A Little Help needed climbing 3-5 steps with a railing? : A Lot 6 Click Score: 11    End of Session Equipment Utilized During Treatment: Gait belt;Oxygen Activity Tolerance: Patient tolerated treatment well Patient left: in chair;with nursing/sitter in room;with call bell/phone within reach;with chair alarm set Nurse Communication: Mobility status PT Visit Diagnosis: Unsteadiness on feet (R26.81);Muscle weakness (generalized) (M62.81);Pain     Time: 3825-0539 PT Time Calculation (min) (ACUTE ONLY): 26 min  Charges:  $Gait Training: 8-22 mins $Therapeutic Activity: 8-22 mins                    G Codes:  Functional Assessment Tool Used: AM-PAC 6 Clicks Basic Mobility    Fransisca Connors, SPTA    Fransisca Connors 12/21/2016, 12:41 PM

## 2016-12-22 DIAGNOSIS — I480 Paroxysmal atrial fibrillation: Secondary | ICD-10-CM

## 2016-12-22 LAB — HEPATIC FUNCTION PANEL
ALT: 117 U/L — ABNORMAL HIGH (ref 14–54)
AST: 32 U/L (ref 15–41)
Albumin: 2.6 g/dL — ABNORMAL LOW (ref 3.5–5.0)
Alkaline Phosphatase: 109 U/L (ref 38–126)
Bilirubin, Direct: 0.2 mg/dL (ref 0.1–0.5)
Indirect Bilirubin: 0.7 mg/dL (ref 0.3–0.9)
Total Bilirubin: 0.9 mg/dL (ref 0.3–1.2)
Total Protein: 5 g/dL — ABNORMAL LOW (ref 6.5–8.1)

## 2016-12-22 LAB — POCT I-STAT 4, (NA,K, GLUC, HGB,HCT)
Glucose, Bld: 165 mg/dL — ABNORMAL HIGH (ref 65–99)
HEMATOCRIT: 25 % — AB (ref 36.0–46.0)
HEMOGLOBIN: 8.5 g/dL — AB (ref 12.0–15.0)
POTASSIUM: 7.3 mmol/L — AB (ref 3.5–5.1)
SODIUM: 127 mmol/L — AB (ref 135–145)

## 2016-12-22 LAB — COOXEMETRY PANEL
Carboxyhemoglobin: 1.6 % — ABNORMAL HIGH (ref 0.5–1.5)
Carboxyhemoglobin: 1.6 % — ABNORMAL HIGH (ref 0.5–1.5)
Methemoglobin: 1.3 % (ref 0.0–1.5)
Methemoglobin: 1.2 % (ref 0.0–1.5)
O2 Saturation: 71.3 %
O2 Saturation: 65.9 %
Total hemoglobin: 8.7 g/dL — ABNORMAL LOW (ref 12.0–16.0)
Total hemoglobin: 9 g/dL — ABNORMAL LOW (ref 12.0–16.0)

## 2016-12-22 LAB — POCT I-STAT, CHEM 8
BUN: 10 mg/dL (ref 6–20)
CREATININE: 0.7 mg/dL (ref 0.44–1.00)
Calcium, Ion: 1.21 mmol/L (ref 1.15–1.40)
Chloride: 82 mmol/L — ABNORMAL LOW (ref 101–111)
GLUCOSE: 152 mg/dL — AB (ref 65–99)
HEMATOCRIT: 28 % — AB (ref 36.0–46.0)
HEMOGLOBIN: 9.5 g/dL — AB (ref 12.0–15.0)
Potassium: 3.4 mmol/L — ABNORMAL LOW (ref 3.5–5.1)
Sodium: 131 mmol/L — ABNORMAL LOW (ref 135–145)
TCO2: 37 mmol/L — AB (ref 22–32)

## 2016-12-22 LAB — BASIC METABOLIC PANEL
Anion gap: 7 (ref 5–15)
BUN: 10 mg/dL (ref 6–20)
CO2: 36 mmol/L — ABNORMAL HIGH (ref 22–32)
Calcium: 8.7 mg/dL — ABNORMAL LOW (ref 8.9–10.3)
Chloride: 88 mmol/L — ABNORMAL LOW (ref 101–111)
Creatinine, Ser: 0.57 mg/dL (ref 0.44–1.00)
GFR calc Af Amer: >60 mL/min (ref >60)
GFR calc non Af Amer: >60 mL/min (ref >60)
Glucose, Bld: 139 mg/dL — ABNORMAL HIGH (ref 65–99)
Potassium: 4 mmol/L (ref 3.5–5.1)
Sodium: 131 mmol/L — ABNORMAL LOW (ref 135–145)

## 2016-12-22 LAB — GLUCOSE, CAPILLARY
GLUCOSE-CAPILLARY: 117 mg/dL — AB (ref 65–99)
GLUCOSE-CAPILLARY: 111 mg/dL — AB (ref 65–99)
Glucose-Capillary: 110 mg/dL — ABNORMAL HIGH (ref 65–99)
Glucose-Capillary: 94 mg/dL (ref 65–99)

## 2016-12-22 LAB — CBC
HCT: 27.7 % — ABNORMAL LOW (ref 36.0–46.0)
Hemoglobin: 8.7 g/dL — ABNORMAL LOW (ref 12.0–15.0)
MCH: 27.4 pg (ref 26.0–34.0)
MCHC: 31.4 g/dL (ref 30.0–36.0)
MCV: 87.4 fL (ref 78.0–100.0)
Platelets: 283 10*3/uL (ref 150–400)
RBC: 3.17 MIL/uL — ABNORMAL LOW (ref 3.87–5.11)
RDW: 19.5 % — ABNORMAL HIGH (ref 11.5–15.5)
WBC: 10.9 10*3/uL — ABNORMAL HIGH (ref 4.0–10.5)

## 2016-12-22 LAB — MAGNESIUM: Magnesium: 1.9 mg/dL (ref 1.7–2.4)

## 2016-12-22 MED ORDER — FUROSEMIDE 40 MG PO TABS
40.0000 mg | ORAL_TABLET | Freq: Two times a day (BID) | ORAL | Status: DC
  Administered 2016-12-22 – 2016-12-24 (×5): 40 mg via ORAL
  Filled 2016-12-22 (×5): qty 1

## 2016-12-22 MED ORDER — HYDROCORTISONE ACETATE 25 MG RE SUPP
25.0000 mg | Freq: Two times a day (BID) | RECTAL | Status: DC | PRN
  Filled 2016-12-22: qty 1

## 2016-12-22 MED ORDER — POTASSIUM CHLORIDE 10 MEQ/50ML IV SOLN
10.0000 meq | INTRAVENOUS | Status: AC | PRN
  Administered 2016-12-22 (×3): 10 meq via INTRAVENOUS
  Filled 2016-12-22: qty 50

## 2016-12-22 MED ORDER — CHLORHEXIDINE GLUCONATE CLOTH 2 % EX PADS
6.0000 | MEDICATED_PAD | Freq: Every day | CUTANEOUS | Status: DC
  Administered 2016-12-23: 6 via TOPICAL

## 2016-12-22 NOTE — Progress Notes (Signed)
Patient ID: Emily Rocha, female   DOB: 1955/11/02, 61 y.o.   MRN: [id number] [facility] Evening Rounds:  Hemodynamically stable on milrinone 0.125  Co-ox this afternoon 66.  CVP 18  Diuresing well with lasix. Replete K+  BMET    Component Value Date/Time   NA 131 (L) 12/22/2016 1514   K 3.4 (L) 12/22/2016 1514   CL 82 (L) 12/22/2016 1514   CO2 36 (H) 12/22/2016 0410   GLUCOSE 152 (H) 12/22/2016 1514   BUN 10 12/22/2016 1514   CREATININE 0.70 12/22/2016 1514   CALCIUM 8.7 (L) 12/22/2016 0410   GFRNONAA >60 12/22/2016 0410   GFRAA >60 12/22/2016 0410

## 2016-12-22 NOTE — Progress Notes (Addendum)
[facility] DAILY ICU PROGRESS NOTE                   [address]            [place] [address]          [phone]   8 Days Post-Op Procedure(s) (LRB): CORONARY ARTERY BYPASS GRAFTING (CABG) x three , using left internal mammary artery and right leg greater saphenous vein harvested endoscopically (N/A) TRANSESOPHAGEAL ECHOCARDIOGRAM (TEE) (N/A)  Total Length of Stay:  LOS: 10 days   Subjective:  No new complaints.  Feeling better after her breathing treatment.  Objective: Vital signs in last 24 hours: Temp:  [97.9 F (36.6 C)-98.8 F (37.1 C)] 98 F (36.7 C) (11/07 0820) Pulse Rate:  [88-104] 104 (11/07 0945) Cardiac Rhythm: Normal sinus rhythm (11/07 0800) Resp:  [13-39] 25 (11/07 0945) BP: (63-124)/(40-111) 113/59 (11/07 0945) SpO2:  [91 %-100 %] 96 % (11/07 0945) FiO2 (%):  [32 %] 32 % (11/06 1309) Weight:  [197 lb 8.5 oz (89.6 kg)] 197 lb 8.5 oz (89.6 kg) (11/07 0545)  Filed Weights   12/20/16 0500 12/21/16 0334 12/22/16 0545  Weight: 198 lb 10.2 oz (90.1 kg) 186 lb 1.1 oz (84.4 kg) 197 lb 8.5 oz (89.6 kg)    Weight change: 11 lb 7.4 oz (5.2 kg)   Hemodynamic parameters for last 24 hours: CVP:  [15 mmHg] 15 mmHg  Intake/Output from previous day: 11/06 0701 - 11/07 0700 In: 2216.2 [P.O.:360; I.V.:1706.2; IV Piggyback:150] Out: 3300 [Urine:3300]  Intake/Output this shift: Total I/O In: -  Out: 200 [Urine:200]  Current Meds: Scheduled Meds: . amiodarone  200 mg Oral BID  . aspirin EC  81 mg Oral Daily  . bisacodyl  10 mg Oral Daily   Or  . bisacodyl  10 mg Rectal Daily  . clopidogrel  75 mg Oral Daily  . digoxin  0.125 mg Oral Daily  . docusate sodium  200 mg Oral Daily  . enoxaparin (LOVENOX) injection  40 mg Subcutaneous Q24H  . ferrous [id number] C-folic acid  1 capsule Oral TID PC  . furosemide  40 mg Oral BID  . insulin aspart  0-15 Units Subcutaneous TID WC  . insulin aspart  0-5 Units Subcutaneous QHS  . ipratropium   0.5 mg Nebulization TID  . levalbuterol  1.25 mg Nebulization TID  . mouth rinse  15 mL Mouth Rinse BID  . mometasone-formoterol  2 puff Inhalation BID  . pantoprazole  40 mg Oral Daily  . sodium chloride flush  10-40 mL Intracatheter Q12H  . sodium chloride flush  3 mL Intravenous Q12H   Continuous Infusions: . sodium chloride    . sodium chloride    . DOPamine Stopped (12/21/16 1500)  . lactated ringers Stopped (12/19/16 0918)  . lactated ringers 20 mL/hr at 12/21/16 1800  . milrinone 0.125 mcg/kg/min (12/22/16 0956)  . phenylephrine (NEO-SYNEPHRINE) Adult infusion 15 mcg/min (12/22/16 0956)   PRN Meds:.ALPRAZolam, fentaNYL (SUBLIMAZE) injection, Influenza vac split quadrivalent PF, levalbuterol, magnesium hydroxide, metoprolol tartrate, ondansetron (ZOFRAN) IV, oxyCODONE **OR** oxyCODONE, oxyCODONE, sodium chloride flush, sodium chloride flush, traMADol  General appearance: alert, cooperative and no distress Heart: regular rate and rhythm and 2/6 murmur Lungs: wheezes bilaterally Abdomen: soft, non-tender; bowel sounds normal; no masses,  no organomegaly Extremities: edema trace to 1+ Wound: clean and dry, ecchymosis of RLE  Lab Results: CBC: Recent Labs    12/21/16 0332  12/21/16 2353 12/22/16 0410  WBC 11.6*  --   --  10.9*  HGB 8.9*   < > 8.8* 8.7*  HCT 28.4*   < > 26.0* 27.7*  PLT 260  --   --  283   < > = values in this interval not displayed.   BMET:  Recent Labs    12/21/16 1632 12/21/16 2353 12/22/16 0410  NA 131* 131* 131*  K 3.6 4.4 4.0  CL 89*  --  88*  CO2 35*  --  36*  GLUCOSE 111* 131* 139*  BUN 12  --  10  CREATININE 0.66  --  0.57  CALCIUM 8.5*  --  8.7*    CMET: Lab Results  Component Value Date   WBC 10.9 (H) 12/22/2016   HGB 8.7 (L) 12/22/2016   HCT 27.7 (L) 12/22/2016   PLT 283 12/22/2016   GLUCOSE 139 (H) 12/22/2016   CHOL 205 (H) 12/13/2016   TRIG 101 12/13/2016   HDL 68 12/13/2016   LDLCALC 117 (H) 12/13/2016   ALT 117 (H)  12/22/2016   AST 32 12/22/2016   NA 131 (L) 12/22/2016   K 4.0 12/22/2016   CL 88 (L) 12/22/2016   CREATININE 0.57 12/22/2016   BUN 10 12/22/2016   CO2 36 (H) 12/22/2016   INR 1.40 12/14/2016   HGBA1C 6.0 (H) 12/20/2016      PT/INR: No results for input(s): LABPROT, INR in the last 72 hours. Radiology: No results found.   Assessment/Plan: S/P Procedure(s) (LRB): CORONARY ARTERY BYPASS GRAFTING (CABG) x three , using left internal mammary artery and right leg greater saphenous vein harvested endoscopically (N/A) TRANSESOPHAGEAL ECHOCARDIOGRAM (TEE) (N/A)  1. CV- NSR, BP has been labile- remains on Neo for Bp support, weaning Milrinone- AHF managing meds 2. Pulm- COPD, weaning oxygen as tolerated, continue IS, nebs 3. Renal- creatinine remains stable, remains edematous on exam, weight is elevated, IV Lasix stopped due to Hypotension.. Will attempt oral Lasix today if BP allows 4. CBGs controlled, patient not a diabetic, A1c is 6.0.. Will continue SSIP for now 5. Dispo- patient stable, weaning milrinone and diuretics per AHF, continue current care     [name], [name] 12/22/2016 10:10 AM    Patient feeling much better after rehydration Weaning milrinone after excellent a.m. mixed venous saturation Ambulating in hallway Plan transfer to 4 E. stepdown probably in a.m. Patient examined, agree with assessment and plan by E [name], PA-C  Tharon Aquas trigt MD

## 2016-12-22 NOTE — Progress Notes (Signed)
Advanced Heart Failure Rounding Note  PCP: Dr. Woody Seller Primary Cardiologist: New (Dr. Percival Spanish) HF: New (Dr. Haroldine Laws)  Subjective:    Developed Afib overnight 12/21/16. Converted with IV amio bolus.  Massive diuresis noted. IV lasix stopped with hypotension and started on Neosynephrine at 30.   Neo weaned off yesterday but restarted overnight with soft pressures.   On Neosynephrine at 50 this am. Systolic pressures in 42V. Coox 71.3% on milrinone 0.30 mcg/kg/min.   Satting in 70-80s on my arrival.  Noted that humidified O2 connection was sitting on bed, without O2 source. Nurse called to room and re-connected. Pt had just finished breathing treatment with RT and was not reconnected.   Nurse directed to report a Safety Zone.   Feeling OK this am. Coughing up yellow sputum.  Breathing somewhat improved after breathing treatment, and much better after being reconnected to O2. Denies chest pain.   Negative 1.0 L and weight up 11 lbs. ? Accuracy, though diuretics held yesterday.   Creatinine and K stable and WNL.   Echo 12/20/16 LVEF 45-50%, Mild/Mod AS  Objective:   Weight Range: 197 lb 8.5 oz (89.6 kg) Body mass index is 33.91 kg/m.   Vital Signs:   Temp:  [97.9 F (36.6 C)-98.8 F (37.1 C)] 98.7 F (37.1 C) (11/07 0326) Pulse Rate:  [85-102] 92 (11/07 0730) Resp:  [12-39] 24 (11/07 0730) BP: (63-124)/(36-111) 101/63 (11/07 0730) SpO2:  [91 %-100 %] 97 % (11/07 0748) FiO2 (%):  [32 %] 32 % (11/06 1309) Weight:  [197 lb 8.5 oz (89.6 kg)] 197 lb 8.5 oz (89.6 kg) (11/07 0545) Last BM Date: 12/20/16  Weight change: Filed Weights   12/20/16 0500 12/21/16 0334 12/22/16 0545  Weight: 198 lb 10.2 oz (90.1 kg) 186 lb 1.1 oz (84.4 kg) 197 lb 8.5 oz (89.6 kg)   Intake/Output:   Intake/Output Summary (Last 24 hours) at 12/22/2016 0803 Last data filed at 12/22/2016 0700 Gross per 24 hour  Intake 2028.83 ml  Output 3100 ml  Net -1071.17 ml    Physical Exam    General:  Fatigued. No resp difficulty. HEENT: Normal Neck: Supple. JVP 9-10 cm. Carotids 2+ bilat; no bruits. No thyromegaly or nodule noted. Cor: PMI nondisplaced. RRR, 2/6 MR Lungs: Diminished throughout, mild basilar crackles.  Abdomen: Soft, non-tender, non-distended, no HSM. No bruits or masses. +BS  Extremities: No cyanosis, clubbing, or rash. 1-2 + BLE edema. R > L.   Neuro: Alert & orientedx3, cranial nerves grossly intact. moves all 4 extremities w/o difficulty. Affect pleasant   Telemetry   NSR/ST 90-100s, Personally reviewed.   EKG    12/15/16 NSR 69 bpm, Personally reviewed  Labs    CBC Recent Labs    12/21/16 0332  12/21/16 2353 12/22/16 0410  WBC 11.6*  --   --  10.9*  HGB 8.9*   < > 8.8* 8.7*  HCT 28.4*   < > 26.0* 27.7*  MCV 86.1  --   --  87.4  PLT 260  --   --  283   < > = values in this interval not displayed.   Basic Metabolic Panel Recent Labs    12/21/16 0332  12/21/16 1632 12/21/16 2353 12/22/16 0410  NA 128*   < > 131* 131* 131*  K 3.8   < > 3.6 4.4 4.0  CL 77*   < > 89*  --  88*  CO2 41*  --  35*  --  36*  GLUCOSE 216*   < >  111* 131* 139*  BUN 12   < > 12  --  10  CREATININE 0.78   < > 0.66  --  0.57  CALCIUM 8.3*  --  8.5*  --  8.7*  MG 1.8  --   --   --   --    < > = values in this interval not displayed.   Liver Function Tests Recent Labs    12/20/16 0427 12/22/16 0410  AST 69* 32  ALT 247* 117*  ALKPHOS 147* 109  BILITOT 0.9 0.9  PROT 5.4* 5.0*  ALBUMIN 2.8* 2.6*   No results for input(s): LIPASE, AMYLASE in the last 72 hours. Cardiac Enzymes No results for input(s): CKTOTAL, CKMB, CKMBINDEX, TROPONINI in the last 72 hours.  BNP: BNP (last 3 results) Recent Labs    12/12/16 1857  BNP 458.0*    ProBNP (last 3 results) No results for input(s): PROBNP in the last 8760 hours.   D-Dimer No results for input(s): DDIMER in the last 72 hours. Hemoglobin A1C Recent Labs    12/20/16 1504  HGBA1C 6.0*   Fasting Lipid  Panel No results for input(s): CHOL, HDL, LDLCALC, TRIG, CHOLHDL, LDLDIRECT in the last 72 hours. Thyroid Function Tests No results for input(s): TSH, T4TOTAL, T3FREE, THYROIDAB in the last 72 hours.  Invalid input(s): FREET3  Other results:   Imaging   No results found.  Medications:     Scheduled Medications: . amiodarone  200 mg Oral BID  . aspirin EC  81 mg Oral Daily  . bisacodyl  10 mg Oral Daily   Or  . bisacodyl  10 mg Rectal Daily  . clopidogrel  75 mg Oral Daily  . digoxin  0.125 mg Oral Daily  . docusate sodium  200 mg Oral Daily  . enoxaparin (LOVENOX) injection  40 mg Subcutaneous Q24H  . ferrous [id number] C-folic acid  1 capsule Oral TID PC  . insulin aspart  0-15 Units Subcutaneous TID WC  . insulin aspart  0-5 Units Subcutaneous QHS  . ipratropium  0.5 mg Nebulization TID  . levalbuterol  1.25 mg Nebulization TID  . mouth rinse  15 mL Mouth Rinse BID  . mometasone-formoterol  2 puff Inhalation BID  . pantoprazole  40 mg Oral Daily  . sodium chloride flush  10-40 mL Intracatheter Q12H  . sodium chloride flush  3 mL Intravenous Q12H    Infusions: . sodium chloride    . sodium chloride    . DOPamine Stopped (12/21/16 1500)  . lactated ringers Stopped (12/19/16 0918)  . lactated ringers 20 mL/hr at 12/21/16 1800  . milrinone 0.3 mcg/kg/min (12/22/16 0240)  . phenylephrine (NEO-SYNEPHRINE) Adult infusion 50 mcg/min (12/22/16 0637)    PRN Medications: ALPRAZolam, fentaNYL (SUBLIMAZE) injection, Influenza vac split quadrivalent PF, levalbuterol, magnesium hydroxide, metoprolol tartrate, ondansetron (ZOFRAN) IV, oxyCODONE **OR** oxyCODONE, oxyCODONE, sodium chloride flush, sodium chloride flush, traMADol    Patient Profile   Emily Rocha is a 61 y.o. female with h/o Asthma, COPD, tobacco abuse, HLD, PAD, carotid artery stenosis s/p R CEA, and GERD who presented to [facility] 12/12/16 with worsening dyspnea and CP.  Presented with Chest pain ->  NSTEMI with no prior known cardiac disease. [place] 12/14/16 with severe disease. Taken for emergent CABG the same day.  CHF team consulted with difficulty weaning off milrinone and for GDMT.   Assessment/Plan   1. NSTEMI s/p emergent CABG 12/14/16 - Doing well post op.  - Continue IS.  - Continue  PT.  - No BB with low output. - Continue ASA. Statin on hold with elevated LFTs, which are improving.  2. Acute on chronic systolic CHF due to ischemic cardiomyopathy - Echo 12/13/16 EF 45-50%  - Per Dr. Haroldine Laws Intra-Op TEE appears to have EF 25-30% with Mod/Sev MR.  - Volume status trending back up. Start po lasix 40 mg BID.  - Off diamox.  - Coox 71.3% on milrinone 0.30 mcg/kg/min.  Wean milrinone to 0.125 mcg/kg/min. Repeat coox this afternoon.  - Losartan and spiro held yesterday with overdiuresis and hypotension.  - Continue digoxin 0.125 mg daily.  - No BB with low output.  3. Hypotension  - Still requiring neo.  Will try and wean today.  - Think she may need midodrine. Hold off on BP active drugs.  4. COPD with chronic hypoxic respiratory failure - Continue home meds and O2. Hypoxic at times in 70-80s. Duonebs prn. - No wheezing on exam.  5. Post op Afib - Converted with IV amio -> po amio - Continue po amio.  - Went back into Afib overnight 12/21/16 but converted again with IV amio. Milrinone likely exacerbating as well.  - No AC for now.  6. Hypokalemia - Resolved. Continue to follow with diuresis.  7. Hyponatermia - 131 this am.  - Free water restrict.  8. Transaminitis - ? Passive congestion vs cardiogenic shock - Continues to improve.  9. Hypomagnesemia - Recheck today.   Length of Stay: [address]  Annamaria Helling  12/22/2016, 8:03 AM  Advanced Heart Failure Team Pager [phone] (M-F; 7a - 4p)  Please contact [facility] for night-coverage after hours (4p -7a ) and weekends on [email or web address]   Patient seen and examined with the above-signed Advanced Practice  Provider and/or Housestaff. I personally reviewed laboratory data, imaging studies and relevant notes. I independently examined the patient and formulated the important aspects of the plan. I have edited the note to reflect any of my changes or salient points. I have personally discussed the plan with the patient and/or family.  Tenuous but improving. Volume status trending back up but BP remains low. Co-ox ok.  Will wean milrinone to 0.1 and try to stop later today. Wean neo as tolerated. No further AF. Will continue po amio. Diurese gently. Place TED hose. Post-op echo reviewed personally and EF ~45%.  Glori Bickers, MD  3:08 PM

## 2016-12-22 NOTE — Progress Notes (Signed)
Physical Therapy Treatment Patient Details Name: Emily Rocha MRN: [id number] DOB: 13-Oct-1955 Today's Date: 12/22/2016    History of Present Illness 61 y.o. female admitted with non-ST elevation myocardial infarction. Cardiac catheterization revealed severe coronary disease including distal left main. She was taken emergently for cardiac catheterization. Echocardiogram preoperatively showed ejection fraction 45-50%, mild aortic stenosis, mild to moderate mitral regurgitation and mild left atrial enlargement.  CABG x 3 on 12/15/16.  Pt with  hx of carotid artery stenosis s/p right CEA (12/06/14), former smoker, COPD, GERD, HLD, and PAD.     PT Comments    Pt in recliner upon arrival was a little agitated waiting for lunch. Pt agreed to PT while waiting. O2 Sats 83-90% on 2 L.  during session. BP at beginning of session was 97/55mmHg. Was not able to state all sternal precautions today but was able to perform all activities following precautions. BP at end of session 77/61mmHg. Notified nurse. Pt will continue to benefit from PT to increase independence and safety with mobility.    Follow Up Recommendations  Home health PT;Supervision/Assistance - 24 hour     Equipment Recommendations  Rolling walker with 5" wheels;3in1 (PT)    Recommendations for Other Services       Precautions / Restrictions Precautions Precautions: Sternal;Fall Restrictions Weight Bearing Restrictions: Yes Other Position/Activity Restrictions: sternal precautions    Mobility  Bed Mobility               General bed mobility comments: Pt in recliner upon arrival   Transfers Overall transfer level: Needs assistance Equipment used: Rolling walker (2 wheeled) Transfers: Sit to/from Stand Sit to Stand: Min guard         General transfer comment: Min guard for safety and cues to hold heart pillow.   Ambulation/Gait Ambulation/Gait assistance: Min guard Ambulation Distance (Feet): 200 Feet Assistive  device: Rolling walker (2 wheeled) Gait Pattern/deviations: Step-through pattern;Trunk flexed Gait velocity: decreased   General Gait Details: Pt used RW during gait. O2 sats at 83% 1 rest break standing, O2 sats back up to 90% and able to ambulate back to room. Pt able to self correct walker pulling to left.    Stairs            Wheelchair Mobility    Modified Rankin (Stroke Patients Only)       Balance Overall balance assessment: Needs assistance;History of Falls Sitting-balance support: No upper extremity supported;Feet supported Sitting balance-Leahy Scale: Good Sitting balance - Comments: min guard for safety. Pt able to sit erect and scoot back in recliner without LOB   Standing balance support: Bilateral upper extremity supported;During functional activity Standing balance-Leahy Scale: Fair Standing balance comment: Pt was able to perform sit to stand transfer following sternal percautions after using BSC and perform pericare with supervision for safety.  relies on UE support when SOB.                            Cognition Arousal/Alertness: Awake/alert Behavior During Therapy: WFL for tasks assessed/performed Overall Cognitive Status: Within Functional Limits for tasks assessed                                 General Comments: Pt was not able to state all of the sternal precautions today.       Exercises      General Comments  Pertinent Vitals/Pain Pain Assessment: No/denies pain    Home Living                      Prior Function            PT Goals (current goals can now be found in the care plan section) Acute Rehab PT Goals Patient Stated Goal: did not discuss Progress towards PT goals: Progressing toward goals    Frequency    Min 3X/week      PT Plan Current plan remains appropriate    Co-evaluation              AM-PAC PT "6 Clicks" Daily Activity  Outcome Measure  Difficulty turning  over in bed (including adjusting bedclothes, sheets and blankets)?: Unable Difficulty moving from lying on back to sitting on the side of the bed? : Unable Difficulty sitting down on and standing up from a chair with arms (e.g., wheelchair, bedside commode, etc,.)?: Unable Help needed moving to and from a bed to chair (including a wheelchair)?: A Little Help needed walking in hospital room?: A Little Help needed climbing 3-5 steps with a railing? : A Lot 6 Click Score: 11    End of Session Equipment Utilized During Treatment: Gait belt;Oxygen Activity Tolerance: Patient tolerated treatment well Patient left: in chair;with call bell/phone within reach;with chair alarm set;with nursing/sitter in room Nurse Communication: Mobility status PT Visit Diagnosis: Unsteadiness on feet (R26.81);Muscle weakness (generalized) (M62.81);Pain     Time: 3383-2919 PT Time Calculation (min) (ACUTE ONLY): 21 min  Charges:  $Gait Training: 8-22 mins                    G Codes:  Functional Assessment Tool Used: AM-PAC 6 Clicks Basic Mobility    Fransisca Connors, SPTA    Fransisca Connors 12/22/2016, 4:09 PM

## 2016-12-22 NOTE — Progress Notes (Signed)
PT Cancellation Note  Patient Details Name: Emily Rocha MRN: [id number] DOB: 1955-04-15   Cancelled Treatment:    Reason Eval/Treat Not Completed: (P) Other (comment)(Pt reported that she had already walked twice today.  Wanted to rest/nap and attempt therapy after lunch. Will go see this PM.)  Fransisca Connors, SPTA   Fransisca Connors 12/22/2016, 12:01 PM

## 2016-12-22 NOTE — Plan of Care (Signed)
  Activity: Risk for activity intolerance will decrease 12/22/2016 0439 - Progressing by Alonna Buckler, RN Note Patient ambulating 3x/day, tolerating activity well. Sits up in chair during the day time.    Bowel/Gastric: Gastrointestinal status for postoperative course will improve 12/22/2016 0439 - Progressing by Alonna Buckler, RN Note Patient had a bowel movement 12/20/16.   Cardiac: Hemodynamic stability will improve 12/22/2016 0439 - Progressing by Alonna Buckler, RN Note Requiring BP support, via Neosynephrine, overnight during sleep to keep MAPs >70. Has remained NSR in the 70s-90s all shift.    Education: Knowledge of the prescribed therapeutic regimen will improve 12/22/2016 0439 - Progressing by Alonna Buckler, RN Note Patient is able to verbalize activity limitations after discharge.    Nutritional: Risk for body nutrition deficit will decrease 12/22/2016 0439 - Progressing by Alonna Buckler, RN Note Patient reports increased appetite, back to normal, and ability to eat most of her meals.    Pain Management: Pain level will decrease 12/22/2016 0439 - Progressing by Alonna Buckler, RN Note Pt denies any pain this shift.

## 2016-12-23 DIAGNOSIS — Z951 Presence of aortocoronary bypass graft: Secondary | ICD-10-CM

## 2016-12-23 LAB — COOXEMETRY PANEL
CARBOXYHEMOGLOBIN: 1.4 % (ref 0.5–1.5)
Carboxyhemoglobin: 1.9 % — ABNORMAL HIGH (ref 0.5–1.5)
METHEMOGLOBIN: 1.1 % (ref 0.0–1.5)
Methemoglobin: 0.9 % (ref 0.0–1.5)
O2 Saturation: 57.6 %
O2 Saturation: 56.5 %
Total hemoglobin: 9.3 g/dL — ABNORMAL LOW (ref 12.0–16.0)
Total hemoglobin: 10.1 g/dL — ABNORMAL LOW (ref 12.0–16.0)

## 2016-12-23 LAB — GLUCOSE, CAPILLARY
GLUCOSE-CAPILLARY: 119 mg/dL — AB (ref 65–99)
GLUCOSE-CAPILLARY: 84 mg/dL (ref 65–99)
Glucose-Capillary: 125 mg/dL — ABNORMAL HIGH (ref 65–99)
Glucose-Capillary: 103 mg/dL — ABNORMAL HIGH (ref 65–99)

## 2016-12-23 LAB — CBC
HCT: 29.2 % — ABNORMAL LOW (ref 36.0–46.0)
Hemoglobin: 9 g/dL — ABNORMAL LOW (ref 12.0–15.0)
MCH: 27.3 pg (ref 26.0–34.0)
MCHC: 30.8 g/dL (ref 30.0–36.0)
MCV: 88.5 fL (ref 78.0–100.0)
Platelets: 287 10*3/uL (ref 150–400)
RBC: 3.3 MIL/uL — ABNORMAL LOW (ref 3.87–5.11)
RDW: 19.9 % — ABNORMAL HIGH (ref 11.5–15.5)
WBC: 8.6 10*3/uL (ref 4.0–10.5)

## 2016-12-23 LAB — BASIC METABOLIC PANEL
Anion gap: 7 (ref 5–15)
BUN: 8 mg/dL (ref 6–20)
CO2: 38 mmol/L — ABNORMAL HIGH (ref 22–32)
Calcium: 8.5 mg/dL — ABNORMAL LOW (ref 8.9–10.3)
Chloride: 87 mmol/L — ABNORMAL LOW (ref 101–111)
Creatinine, Ser: 0.48 mg/dL (ref 0.44–1.00)
GFR calc Af Amer: >60 mL/min (ref >60)
GFR calc non Af Amer: >60 mL/min (ref >60)
Glucose, Bld: 98 mg/dL (ref 65–99)
Potassium: 3.7 mmol/L (ref 3.5–5.1)
Sodium: 132 mmol/L — ABNORMAL LOW (ref 135–145)

## 2016-12-23 MED ORDER — FUROSEMIDE 10 MG/ML IJ SOLN
60.0000 mg | Freq: Once | INTRAMUSCULAR | Status: AC
  Administered 2016-12-23: 60 mg via INTRAVENOUS
  Filled 2016-12-23: qty 6

## 2016-12-23 MED ORDER — POTASSIUM CHLORIDE 10 MEQ/50ML IV SOLN
10.0000 meq | INTRAVENOUS | Status: AC
  Administered 2016-12-23 (×3): 10 meq via INTRAVENOUS
  Filled 2016-12-23 (×3): qty 50

## 2016-12-23 MED ORDER — POTASSIUM CHLORIDE 10 MEQ/50ML IV SOLN: 10.0000 meq | INTRAVENOUS | Status: DC

## 2016-12-23 MED ORDER — SORBITOL 70 % PO SOLN
60.0000 mL | Freq: Once | ORAL | Status: AC
  Administered 2016-12-23: 60 mL via ORAL
  Filled 2016-12-23: qty 60

## 2016-12-23 MED ORDER — HYDROCORTISONE 2.5 % RE CREA
TOPICAL_CREAM | Freq: Two times a day (BID) | RECTAL | Status: DC
  Administered 2016-12-23 – 2016-12-26 (×5): via RECTAL
  Filled 2016-12-23 (×2): qty 28.35

## 2016-12-23 MED ORDER — METOLAZONE 5 MG PO TABS
5.0000 mg | ORAL_TABLET | Freq: Once | ORAL | Status: AC
  Administered 2016-12-23: 5 mg via ORAL
  Filled 2016-12-23: qty 1

## 2016-12-23 MED ORDER — ATORVASTATIN CALCIUM 40 MG PO TABS
40.0000 mg | ORAL_TABLET | Freq: Every day | ORAL | Status: DC
  Administered 2016-12-23 – 2016-12-27 (×5): 40 mg via ORAL
  Filled 2016-12-23 (×5): qty 1

## 2016-12-23 NOTE — Progress Notes (Addendum)
[facility] DAILY ICU PROGRESS NOTE                   [address]            [place] [address]          [phone]   9 Days Post-Op Procedure(s) (LRB): CORONARY ARTERY BYPASS GRAFTING (CABG) x three , using left internal mammary artery and right leg greater saphenous vein harvested endoscopically (N/A) TRANSESOPHAGEAL ECHOCARDIOGRAM (TEE) (N/A)  Total Length of Stay:  LOS: 11 days   Subjective:  No new complaints.  Continues to have incisional discomfort worse with coughing.  She has very shallow breathing and she was encouraged to take good deep breaths which is important for her lungs to recover from the surgery.  She also request cream for her hemorrhoids.  + ambulation  + BM  Objective: Vital signs in last 24 hours: Temp:  [98.1 F (36.7 C)-98.7 F (37.1 C)] 98.4 F (36.9 C) (11/08 0743) Pulse Rate:  [92-104] 96 (11/08 0800) Cardiac Rhythm: Normal sinus rhythm (11/08 0743) Resp:  [11-34] 26 (11/08 0800) BP: (77-114)/(40-70) 104/61 (11/08 0800) SpO2:  [91 %-100 %] 94 % (11/08 0800) Weight:  [197 lb 1.5 oz (89.4 kg)] 197 lb 1.5 oz (89.4 kg) (11/08 0500)  Filed Weights   12/21/16 0334 12/22/16 0545 12/23/16 0500  Weight: 186 lb 1.1 oz (84.4 kg) 197 lb 8.5 oz (89.6 kg) 197 lb 1.5 oz (89.4 kg)    Weight change: -7.1 oz (-0.2 kg)   Hemodynamic parameters for last 24 hours: CVP:  [14 mmHg-17 mmHg] 17 mmHg  Intake/Output from previous day: 11/07 0701 - 11/08 0700 In: 1770.8 [P.O.:960; I.V.:660.8; IV Piggyback:150] Out: 2950 [Urine:2950]  Current Meds: Scheduled Meds: . amiodarone  200 mg Oral BID  . aspirin EC  81 mg Oral Daily  . bisacodyl  10 mg Oral Daily   Or  . bisacodyl  10 mg Rectal Daily  . Chlorhexidine Gluconate Cloth  6 each Topical Q0600  . clopidogrel  75 mg Oral Daily  . docusate sodium  200 mg Oral Daily  . enoxaparin (LOVENOX) injection  40 mg Subcutaneous Q24H  . ferrous [id number] C-folic acid  1 capsule Oral TID PC  .  furosemide  40 mg Oral BID  . hydrocortisone   Rectal BID  . insulin aspart  0-15 Units Subcutaneous TID WC  . insulin aspart  0-5 Units Subcutaneous QHS  . ipratropium  0.5 mg Nebulization TID  . levalbuterol  1.25 mg Nebulization TID  . mometasone-formoterol  2 puff Inhalation BID  . pantoprazole  40 mg Oral Daily  . sodium chloride flush  10-40 mL Intracatheter Q12H  . sodium chloride flush  3 mL Intravenous Q12H   Continuous Infusions: . sodium chloride    . sodium chloride    . lactated ringers Stopped (12/19/16 0918)  . lactated ringers 20 mL/hr at 12/23/16 0600  . milrinone 0.125 mcg/kg/min (12/23/16 0600)  . potassium chloride 10 mEq (12/23/16 0740)   PRN Meds:.ALPRAZolam, hydrocortisone, Influenza vac split quadrivalent PF, levalbuterol, magnesium hydroxide, metoprolol tartrate, ondansetron (ZOFRAN) IV, oxyCODONE, sodium chloride flush, sodium chloride flush, traMADol  General appearance: alert, cooperative and no distress Heart: regular rate and rhythm Lungs: diminished breath sounds bilaterally and wheezes bilaterally Abdomen: soft, non-tender; bowel sounds normal; no masses,  no organomegaly Extremities: edema 1+ but improving Wound: clean and dry  Lab Results: CBC: Recent Labs    12/22/16 0410 12/22/16 1514 12/23/16 0428  WBC 10.9*  --  8.6  HGB 8.7* 9.5* 9.0*  HCT 27.7* 28.0* 29.2*  PLT 283  --  287   BMET:  Recent Labs    12/22/16 0410 12/22/16 1514 12/23/16 0428  NA 131* 131* 132*  K 4.0 3.4* 3.7  CL 88* 82* 87*  CO2 36*  --  38*  GLUCOSE 139* 152* 98  BUN 10 10 8   CREATININE 0.57 0.70 0.48  CALCIUM 8.7*  --  8.5*    CMET: Lab Results  Component Value Date   WBC 8.6 12/23/2016   HGB 9.0 (L) 12/23/2016   HCT 29.2 (L) 12/23/2016   PLT 287 12/23/2016   GLUCOSE 98 12/23/2016   CHOL 205 (H) 12/13/2016   TRIG 101 12/13/2016   HDL 68 12/13/2016   LDLCALC 117 (H) 12/13/2016   ALT 117 (H) 12/22/2016   AST 32 12/22/2016   NA 132 (L)  12/23/2016   K 3.7 12/23/2016   CL 87 (L) 12/23/2016   CREATININE 0.48 12/23/2016   BUN 8 12/23/2016   CO2 38 (H) 12/23/2016   INR 1.40 12/14/2016   HGBA1C 6.0 (H) 12/20/2016      PT/INR: No results for input(s): LABPROT, INR in the last 72 hours. Radiology: No results found.   Assessment/Plan: S/P Procedure(s) (LRB): CORONARY ARTERY BYPASS GRAFTING (CABG) x three , using left internal mammary artery and right leg greater saphenous vein harvested endoscopically (N/A) TRANSESOPHAGEAL ECHOCARDIOGRAM (TEE) (N/A)  1. CV- NSR, BP remains labile, but able tolerate off NEO, remains on milrinone at 0.125... Coox this AM is 57.6, wean per AHF 2. Pulm- COPD, patient encouraged to increase deep breathing, continue nebs, IS usage 3. Renal-creatinine remains stable, on Lasix 40 mg daily, weight was unchanged from yesterday, would benefit from additional diuresis if BP would allow 4. CBGs remain controlled 5. Dispo- patient weaning Milrinone as tolerated per AHF, off Neo, continue aggressive pulmonary toilet, diuresis, continue current care     [name], ERIN   Ready to tx to stepdown for milrinone wean PAL CXR in am Appreciate adv HF management One dose metolazone this am patient examined and medical record reviewed,agree with above note. Tharon Aquas Trigt III 12/23/2016     12/23/2016 8:24 AM

## 2016-12-23 NOTE — Progress Notes (Signed)
Advanced Heart Failure Rounding Note  PCP: Dr. Woody Seller Primary Cardiologist: New (Dr. Percival Spanish) HF: New (Dr. Haroldine Laws)  Subjective:    Developed Afib overnight 12/21/16. Converted with IV amio bolus.  Massive diuresis noted. IV lasix stopped with hypotension and started on Neosynephrine at 30.   Off Neo. Systolic pressures in 604-540J. Coox 57.6% on milrinone 0.125 mcg/kg/min.   Improving slowly. Still SOB walking around room, but feeling better. Denies lightheadedness or dizziness.   Negative 1.1 L but weight unchanged.   Echo 12/20/16 LVEF 45-50%, Mild/Mod AS  Objective:   Weight Range: 197 lb 1.5 oz (89.4 kg) Body mass index is 33.83 kg/m.   Vital Signs:   Temp:  [98.1 F (36.7 C)-98.7 F (37.1 C)] 98.1 F (36.7 C) (11/08 0830) Pulse Rate:  [92-104] 96 (11/08 0900) Resp:  [11-36] 24 (11/08 0900) BP: (77-114)/(40-70) 101/61 (11/08 0900) SpO2:  [91 %-100 %] 93 % (11/08 0900) Weight:  [197 lb 1.5 oz (89.4 kg)] 197 lb 1.5 oz (89.4 kg) (11/08 0500) Last BM Date: 12/20/16  Weight change: Filed Weights   12/21/16 0334 12/22/16 0545 12/23/16 0500  Weight: 186 lb 1.1 oz (84.4 kg) 197 lb 8.5 oz (89.6 kg) 197 lb 1.5 oz (89.4 kg)   Intake/Output:   Intake/Output Summary (Last 24 hours) at 12/23/2016 0930 Last data filed at 12/23/2016 0903 Gross per 24 hour  Intake 1606.87 ml  Output 2750 ml  Net -1143.13 ml    Physical Exam    General: Fatigued. NAD HEENT: Normal Neck: Supple. JVP 12-13 cm. Carotids 2+ bilat; no bruits. No thyromegaly or nodule noted. Cor: PMI nondisplaced. RRR, 2/6 MR Lungs: Diminished throughout with mild basilar crackles. Abdomen: Soft, non-tender, non-distended, no HSM. No bruits or masses. +BS  Extremities: No cyanosis, clubbing, or rash. 1+ BLE edema.  Neuro: Alert & orientedx3, cranial nerves grossly intact. moves all 4 extremities w/o difficulty. Affect pleasant    Telemetry   NSR 90s, Personally reviewed.   EKG    12/15/16 NSR 69  bpm, Personally reviewed  Labs    CBC Recent Labs    12/22/16 0410 12/22/16 1514 12/23/16 0428  WBC 10.9*  --  8.6  HGB 8.7* 9.5* 9.0*  HCT 27.7* 28.0* 29.2*  MCV 87.4  --  88.5  PLT 283  --  811   Basic Metabolic Panel Recent Labs    12/21/16 0332  12/22/16 0410 12/22/16 0830 12/22/16 1514 12/23/16 0428  NA 128*   < > 131*  --  131* 132*  K 3.8   < > 4.0  --  3.4* 3.7  CL 77*   < > 88*  --  82* 87*  CO2 41*   < > 36*  --   --  38*  GLUCOSE 216*   < > 139*  --  152* 98  BUN 12   < > 10  --  10 8  CREATININE 0.78   < > 0.57  --  0.70 0.48  CALCIUM 8.3*   < > 8.7*  --   --  8.5*  MG 1.8  --   --  1.9  --   --    < > = values in this interval not displayed.   Liver Function Tests Recent Labs    12/22/16 0410  AST 32  ALT 117*  ALKPHOS 109  BILITOT 0.9  PROT 5.0*  ALBUMIN 2.6*   No results for input(s): LIPASE, AMYLASE in the last 72 hours. Cardiac Enzymes  No results for input(s): CKTOTAL, CKMB, CKMBINDEX, TROPONINI in the last 72 hours.  BNP: BNP (last 3 results) Recent Labs    12/12/16 1857  BNP 458.0*    ProBNP (last 3 results) No results for input(s): PROBNP in the last 8760 hours.   D-Dimer No results for input(s): DDIMER in the last 72 hours. Hemoglobin A1C Recent Labs    12/20/16 1504  HGBA1C 6.0*   Fasting Lipid Panel No results for input(s): CHOL, HDL, LDLCALC, TRIG, CHOLHDL, LDLDIRECT in the last 72 hours. Thyroid Function Tests No results for input(s): TSH, T4TOTAL, T3FREE, THYROIDAB in the last 72 hours.  Invalid input(s): FREET3  Other results:   Imaging   No results found.  Medications:     Scheduled Medications: . amiodarone  200 mg Oral BID  . aspirin EC  81 mg Oral Daily  . bisacodyl  10 mg Oral Daily   Or  . bisacodyl  10 mg Rectal Daily  . Chlorhexidine Gluconate Cloth  6 each Topical Q0600  . clopidogrel  75 mg Oral Daily  . docusate sodium  200 mg Oral Daily  . enoxaparin (LOVENOX) injection  40 mg  Subcutaneous Q24H  . ferrous [id number] C-folic acid  1 capsule Oral TID PC  . furosemide  40 mg Oral BID  . hydrocortisone   Rectal BID  . insulin aspart  0-15 Units Subcutaneous TID WC  . insulin aspart  0-5 Units Subcutaneous QHS  . ipratropium  0.5 mg Nebulization TID  . levalbuterol  1.25 mg Nebulization TID  . metolazone  5 mg Oral Once  . mometasone-formoterol  2 puff Inhalation BID  . pantoprazole  40 mg Oral Daily  . sodium chloride flush  10-40 mL Intracatheter Q12H  . sodium chloride flush  3 mL Intravenous Q12H  . sorbitol  60 mL Oral Once    Infusions: . sodium chloride    . sodium chloride    . lactated ringers Stopped (12/19/16 0918)  . lactated ringers 20 mL/hr at 12/23/16 0900  . milrinone 0.125 mcg/kg/min (12/23/16 0900)  . potassium chloride 10 mEq (12/23/16 0903)    PRN Medications: ALPRAZolam, hydrocortisone, Influenza vac split quadrivalent PF, levalbuterol, magnesium hydroxide, metoprolol tartrate, ondansetron (ZOFRAN) IV, oxyCODONE, sodium chloride flush, sodium chloride flush, traMADol    Patient Profile   Emily Rocha is a 61 y.o. female with h/o Asthma, COPD, tobacco abuse, HLD, PAD, carotid artery stenosis s/p R CEA, and GERD who presented to [facility] 12/12/16 with worsening dyspnea and CP.  Presented with Chest pain -> NSTEMI with no prior known cardiac disease. [place] 12/14/16 with severe disease. Taken for emergent CABG the same day.  CHF team consulted with difficulty weaning off milrinone and for GDMT.   Assessment/Plan   1. NSTEMI s/p emergent CABG 12/14/16 - Continues to improve post op.  - Continue IS.  - Continue PT.  - No BB with low output. - Continue ASA. Statin on hold with elevated LFTs, which are improving.  2. Acute on chronic systolic CHF due to ischemic cardiomyopathy - Echo 12/13/16 EF 45-50%  - Per Dr. Haroldine Laws Intra-Op TEE appears to have EF 25-30% with Mod/Sev MR.  - Volume status remains elevated. - Agree with  metolazone, will also give 60 mg IV lasix on top of morning po dose.  Keep po lasix in for this evening.  - Coox 57.6% on milrinone 0.125 mcg/kg/min. Will stop milrinone and check Coox this afternoon.  - Losartan and spiro remain on hold  for now.  - Continue digoxin 0.125 mg daily.  - No BB with low output.  3. Hypotension  - Off neo. Continue to follow. Meds as above.  4. COPD with chronic hypoxic respiratory failure - Continue home meds and O2. 5. Post op Afib - Converted with IV amio -> po amio - Continue po amio.   - Went back into Afib overnight 12/21/16 but converted again with IV amio. Milrinone likely exacerbating as well. Now in NSR.   - No AC for now.  6. Hypokalemia - 3.7 this am. Supp with IV lasix.  7. Hyponatermia - 132 this am.  - Free water restrict.  8. Transaminitis - ? Passive congestion vs cardiogenic shock - Continues to improve.  9. Hypomagnesemia - 1.9 12/22/16.    Length of Stay: [place], [place]  12/23/2016, 9:30 AM  Advanced Heart Failure Team Pager [phone] (M-F; 7a - 4p)  Please contact [facility] for night-coverage after hours (4p -7a ) and weekends on [email or web address]  Patient seen and examined with the above-signed Advanced Practice Provider and/or Housestaff. I personally reviewed laboratory data, imaging studies and relevant notes. I independently examined the patient and formulated the important aspects of the plan. I have edited the note to reflect any of my changes or salient points. I have personally discussed the plan with the patient and/or family.  Off pressors. Breathing better but volume status still elevated. Agree with IV diuresis and one dose metolazone today. Limit fluid intake. Can stop milrinone. Check co-ox later today. Continue po amio for PAF.   Glori Bickers, MD  2:11 PM

## 2016-12-23 NOTE — Progress Notes (Signed)
Patient ID: Emily Rocha, female   DOB: 1955-03-24, 61 y.o.   MRN: [id number] EVENING ROUNDS NOTE :     [address]       [place] [address]             [phone]                 9 Days Post-Op Procedure(s) (LRB): CORONARY ARTERY BYPASS GRAFTING (CABG) x three , using left internal mammary artery and right leg greater saphenous vein harvested endoscopically (N/A) TRANSESOPHAGEAL ECHOCARDIOGRAM (TEE) (N/A)  Total Length of Stay:  LOS: 11 days  BP 98/66   Pulse 87   Temp 99.1 F (37.3 C) (Oral)   Resp 12   Ht 5\' 4"  (1.626 m)   Wt 197 lb 1.5 oz (89.4 kg)   SpO2 90%   BMI 33.83 kg/m   .Intake/Output      11/07 0701 - 11/08 0700 11/08 0701 - 11/09 0700   P.O. 960 720   I.V. (mL/kg) 660.8 (7.4) 70.5 (0.8)   IV Piggyback 150 150   Total Intake(mL/kg) 1770.8 (19.8) 940.5 (10.5)   Urine (mL/kg/hr) 2950 (1.4) 2400 (2.4)   Stool  600   Total Output 2950 3000   Net -1179.2 -2059.5        Urine Occurrence  2 x     . lactated ringers Stopped (12/19/16 0918)  . lactated ringers Stopped (12/23/16 1128)     Lab Results  Component Value Date   WBC 8.6 12/23/2016   HGB 9.0 (L) 12/23/2016   HCT 29.2 (L) 12/23/2016   PLT 287 12/23/2016   GLUCOSE 98 12/23/2016   CHOL 205 (H) 12/13/2016   TRIG 101 12/13/2016   HDL 68 12/13/2016   LDLCALC 117 (H) 12/13/2016   ALT 117 (H) 12/22/2016   AST 32 12/22/2016   NA 132 (L) 12/23/2016   K 3.7 12/23/2016   CL 87 (L) 12/23/2016   CREATININE 0.48 12/23/2016   BUN 8 12/23/2016   CO2 38 (H) 12/23/2016   INR 1.40 12/14/2016   HGBA1C 6.0 (H) 12/20/2016   Stable day  Walked around unit   Grace Isaac MD  Beeper [phone] Office [phone] 12/23/2016 6:16 PM

## 2016-12-24 ENCOUNTER — Inpatient Hospital Stay (HOSPITAL_COMMUNITY): Payer: BLUE CROSS/BLUE SHIELD

## 2016-12-24 LAB — MAGNESIUM: Magnesium: 1.7 mg/dL (ref 1.7–2.4)

## 2016-12-24 LAB — BASIC METABOLIC PANEL
Anion gap: 10 (ref 5–15)
BUN: 6 mg/dL (ref 6–20)
CO2: 42 mmol/L — ABNORMAL HIGH (ref 22–32)
Calcium: 8.7 mg/dL — ABNORMAL LOW (ref 8.9–10.3)
Chloride: 79 mmol/L — ABNORMAL LOW (ref 101–111)
Creatinine, Ser: 0.49 mg/dL (ref 0.44–1.00)
GFR calc Af Amer: >60 mL/min (ref >60)
GFR calc non Af Amer: >60 mL/min (ref >60)
Glucose, Bld: 96 mg/dL (ref 65–99)
Potassium: 3.1 mmol/L — ABNORMAL LOW (ref 3.5–5.1)
Sodium: 131 mmol/L — ABNORMAL LOW (ref 135–145)

## 2016-12-24 LAB — CBC
HCT: 30.2 % — ABNORMAL LOW (ref 36.0–46.0)
Hemoglobin: 9.5 g/dL — ABNORMAL LOW (ref 12.0–15.0)
MCH: 27.6 pg (ref 26.0–34.0)
MCHC: 31.5 g/dL (ref 30.0–36.0)
MCV: 87.8 fL (ref 78.0–100.0)
Platelets: 356 10*3/uL (ref 150–400)
RBC: 3.44 MIL/uL — ABNORMAL LOW (ref 3.87–5.11)
RDW: 20 % — ABNORMAL HIGH (ref 11.5–15.5)
WBC: 9.9 10*3/uL (ref 4.0–10.5)

## 2016-12-24 LAB — HEPATIC FUNCTION PANEL
ALT: 74 U/L — ABNORMAL HIGH (ref 14–54)
AST: 28 U/L (ref 15–41)
Albumin: 2.7 g/dL — ABNORMAL LOW (ref 3.5–5.0)
Alkaline Phosphatase: 99 U/L (ref 38–126)
Bilirubin, Direct: 0.2 mg/dL (ref 0.1–0.5)
Indirect Bilirubin: 0.8 mg/dL (ref 0.3–0.9)
Total Bilirubin: 1 mg/dL (ref 0.3–1.2)
Total Protein: 5.6 g/dL — ABNORMAL LOW (ref 6.5–8.1)

## 2016-12-24 LAB — COOXEMETRY PANEL
Carboxyhemoglobin: 1.6 % — ABNORMAL HIGH (ref 0.5–1.5)
Methemoglobin: 1.2 % (ref 0.0–1.5)
O2 SAT: 57.4 %
TOTAL HEMOGLOBIN: 9.6 g/dL — AB (ref 12.0–16.0)

## 2016-12-24 LAB — GLUCOSE, CAPILLARY: GLUCOSE-CAPILLARY: 105 mg/dL — AB (ref 65–99)

## 2016-12-24 MED ORDER — POTASSIUM CHLORIDE CRYS ER 20 MEQ PO TBCR: 20.0000 meq | EXTENDED_RELEASE_TABLET | ORAL | Status: DC | PRN

## 2016-12-24 MED ORDER — POTASSIUM CHLORIDE CRYS ER 20 MEQ PO TBCR
20.0000 meq | EXTENDED_RELEASE_TABLET | ORAL | Status: DC
  Administered 2016-12-24: 20 meq via ORAL
  Filled 2016-12-24: qty 1

## 2016-12-24 MED ORDER — SPIRONOLACTONE 25 MG PO TABS
12.5000 mg | ORAL_TABLET | Freq: Every day | ORAL | Status: DC
  Administered 2016-12-24 – 2016-12-26 (×3): 12.5 mg via ORAL
  Filled 2016-12-24 (×3): qty 1

## 2016-12-24 MED ORDER — POTASSIUM CHLORIDE CRYS ER 20 MEQ PO TBCR
40.0000 meq | EXTENDED_RELEASE_TABLET | Freq: Two times a day (BID) | ORAL | Status: DC
  Administered 2016-12-24 – 2016-12-27 (×7): 40 meq via ORAL
  Filled 2016-12-24 (×7): qty 2

## 2016-12-24 MED ORDER — MAGNESIUM SULFATE 4 GM/100ML IV SOLN
4.0000 g | Freq: Once | INTRAVENOUS | Status: AC
  Administered 2016-12-24: 4 g via INTRAVENOUS
  Filled 2016-12-24: qty 100

## 2016-12-24 MED ORDER — TORSEMIDE 20 MG PO TABS
20.0000 mg | ORAL_TABLET | Freq: Two times a day (BID) | ORAL | Status: DC
  Administered 2016-12-24 – 2016-12-25 (×3): 20 mg via ORAL
  Filled 2016-12-24 (×3): qty 1

## 2016-12-24 MED ORDER — CHLORHEXIDINE GLUCONATE CLOTH 2 % EX PADS
6.0000 | MEDICATED_PAD | Freq: Every day | CUTANEOUS | Status: DC
  Administered 2016-12-24 – 2016-12-26 (×2): 6 via TOPICAL

## 2016-12-24 NOTE — Progress Notes (Addendum)
      [address]       [place] [address]             [phone]    10 Days Post-Op Procedure(s) (LRB): CORONARY ARTERY BYPASS GRAFTING (CABG) x three , using left internal mammary artery and right leg greater saphenous vein harvested endoscopically (N/A) TRANSESOPHAGEAL ECHOCARDIOGRAM (TEE) (N/A)   Subjective:  Patient states she is doing better.  She states she feels like her breathing is much better.  Objective: Vital signs in last 24 hours: Temp:  [97.7 F (36.5 C)-99.1 F (37.3 C)] 97.7 F (36.5 C) (11/09 0948) Pulse Rate:  [82-103] 83 (11/09 0800) Cardiac Rhythm: Normal sinus rhythm (11/09 0951) Resp:  [3-32] 20 (11/09 0948) BP: (95-118)/(53-80) 106/64 (11/09 0800) SpO2:  [90 %-98 %] 94 % (11/09 0948) Weight:  [191 lb 12.8 oz (87 kg)] 191 lb 12.8 oz (87 kg) (11/09 0500)  Hemodynamic parameters for last 24 hours: CVP:  [13 mmHg-18 mmHg] 15 mmHg  Intake/Output from previous day: 11/08 0701 - 11/09 0700 In: 1180.5 [P.O.:960; I.V.:70.5; IV Piggyback:150] Out: 4450 [Urine:3350; Stool:1100] Intake/Output this shift: Total I/O In: 100 [IV Piggyback:100] Out: 150 [Urine:150]  General appearance: alert, cooperative and no distress Heart: regular rate and rhythm Lungs: diminished breath sounds left base Abdomen: soft, non-tender; bowel sounds normal; no masses,  no organomegaly Extremities: edema 1+ Wound: clean and dry, ecchymosis RLE  Lab Results: Recent Labs    12/23/16 0428 12/24/16 0415  WBC 8.6 9.9  HGB 9.0* 9.5*  HCT 29.2* 30.2*  PLT 287 356   BMET:  Recent Labs    12/23/16 0428 12/24/16 0415  NA 132* 131*  K 3.7 3.1*  CL 87* 79*  CO2 38* 42*  GLUCOSE 98 96  BUN 8 6  CREATININE 0.48 0.49  CALCIUM 8.5* 8.7*    PT/INR: No results for input(s): LABPROT, INR in the last 72 hours. ABG    Component Value Date/Time   PHART 7.421 12/16/2016 0306   HCO3 26.7 12/16/2016 0306   TCO2 37 (H) 12/22/2016 1514   O2SAT 57.4  12/24/2016 0417   CBG (last 3)  Recent Labs    12/23/16 1610 12/23/16 2129 12/24/16 0757  GLUCAP 84 103* 105*    Assessment/Plan: S/P Procedure(s) (LRB): CORONARY ARTERY BYPASS GRAFTING (CABG) x three , using left internal mammary artery and right leg greater saphenous vein harvested endoscopically (N/A) TRANSESOPHAGEAL ECHOCARDIOGRAM (TEE) (N/A)  1. CV- previous A. Fib, maintaining NSR-- off all drips, continue Amiodarone 2. Pulm- COPD, weaning oxygen down to 2L, needs more aggressive use of IS only getting to 300, continue nebs, inhalers.. CXR with small pleural effusion on left/atelectasis 3. Renal- creatinine has remained stable, weight is trending down, continue Lasix, Spironolactone restarted per AHF 4. Hypokalemia- K at 3.1, will supplement, but need to watch closely as Spironolactone is being started 5. Dispo- patient stable, off all drips, continue aggressive diuresis, aggressive pulmonary toilet, watch K  LOS: 12 days    Emily Rocha, Emily Rocha 12/24/2016 Now on only 2 L O2 Walking well off Milrinone Plan home Mon AM if she remains stabl;e off milrinone  patient examined and medical record reviewed,agree with above note. Tharon Aquas Trigt III 12/24/2016

## 2016-12-24 NOTE — Progress Notes (Signed)
CARDIAC REHAB PHASE I   PRE:  Rate/Rhythm: 83 SR    BP: sitting 116/60    SaO2: 95 2L  MODE:  Ambulation: 470 ft   POST:  Rate/Rhythm: 97 SR with PVCs    BP: sitting 112/88     SaO2: 95 3L  Pt eager to walk on arrival. She is very excitable and forgot sternal precautions x1. Moving well, just needs reminders to slow down so we can get equipment. Steady walking with RW, assist x2 for safety, and 3L O2. SaO2 in 90s. Left on 2L in recliner. Pt very content. Encouraged more walking, IS, flutter. [place], ACSM 12/24/2016 3:05 PM

## 2016-12-24 NOTE — Progress Notes (Signed)
#  12.  S/W [place] @ [employer] # [phone]   [facility] PT  [place] # [phone]   DME :  MEDICAL NECESSITY  COVER- YES  PRIOR APPROVAL- YES # [phone]   30 VISTED PER CAL YEAR  FOR HOME OR OUTPATIENT SETTING   IN NETWORK $3,900.00- MET- 30 % CO-INS  $5,850.00 OUT OF -POCKET HAS BEEN MET -100

## 2016-12-24 NOTE — Progress Notes (Signed)
Advanced Heart Failure Rounding Note  PCP: Dr. Woody Seller Primary Cardiologist: New (Dr. Percival Spanish) HF: New (Dr. Haroldine Laws)  Subjective:    Developed Afib overnight 12/21/16. Converted with IV amio bolus.  Massive diuresis noted. IV lasix stopped with hypotension and started on Neosynephrine at 30.   Off Neo and off milrinone. Coox 57.4% this am. CVP line not connected.   Feeling better. Off pressors.  Mild lightheadedness with standing.  Has been up walking halls some.  Still breathing very shallow. Only getting IS to 300-400 cc.   Creatinine stable. K 3.1. Negative 3.2 L and down 6 lbs. Given extra 60 mg IV lasix yesterday, then po continued at 40 mg BID.   Echo 12/20/16 LVEF 45-50%, Mild/Mod AS  Objective:   Weight Range: 191 lb 12.8 oz (87 kg) Body mass index is 32.92 kg/m.   Vital Signs:   Temp:  [97.8 F (36.6 C)-99.1 F (37.3 C)] 97.8 F (36.6 C) (11/09 0800) Pulse Rate:  [82-103] 82 (11/09 0700) Resp:  [3-36] 24 (11/09 0700) BP: (95-118)/(53-80) 100/77 (11/09 0700) SpO2:  [90 %-98 %] 97 % (11/09 0745) Weight:  [191 lb 12.8 oz (87 kg)] 191 lb 12.8 oz (87 kg) (11/09 0500) Last BM Date: 12/23/16  Weight change: Filed Weights   12/22/16 0545 12/23/16 0500 12/24/16 0500  Weight: 197 lb 8.5 oz (89.6 kg) 197 lb 1.5 oz (89.4 kg) 191 lb 12.8 oz (87 kg)   Intake/Output:   Intake/Output Summary (Last 24 hours) at 12/24/2016 0817 Last data filed at 12/24/2016 0149 Gross per 24 hour  Intake 890.5 ml  Output 4250 ml  Net -3359.5 ml    Physical Exam    General: Fatigued. NAD.  HEENT: Normal Neck: Supple. JVP 8-9 cm. Carotids 2+ bilat; no bruits. No thyromegaly or nodule noted. Cor: PMI nondisplaced. RRR, No M/G/R noted Lungs: Diminished, shallow breaths.  Abdomen: Soft, non-tender, non-distended, no HSM. No bruits or masses. +BS  Extremities: No cyanosis, clubbing, or rash. Trace to 1+ BLE edema.  Neuro: Alert & orientedx3, cranial nerves grossly intact. moves all 4  extremities w/o difficulty. Affect pleasant   Telemetry   NSR 90s, Personally reviewed.   EKG    12/15/16 NSR 69 bpm, Personally reviewed  Labs    CBC Recent Labs    12/23/16 0428 12/24/16 0415  WBC 8.6 9.9  HGB 9.0* 9.5*  HCT 29.2* 30.2*  MCV 88.5 87.8  PLT 287 161   Basic Metabolic Panel Recent Labs    12/22/16 0830  12/23/16 0428 12/24/16 0415  NA  --    < > 132* 131*  K  --    < > 3.7 3.1*  CL  --    < > 87* 79*  CO2  --   --  38* 42*  GLUCOSE  --    < > 98 96  BUN  --    < > 8 6  CREATININE  --    < > 0.48 0.49  CALCIUM  --   --  8.5* 8.7*  MG 1.9  --   --  1.7   < > = values in this interval not displayed.   Liver Function Tests Recent Labs    12/22/16 0410 12/24/16 0415  AST 32 28  ALT 117* 74*  ALKPHOS 109 99  BILITOT 0.9 1.0  PROT 5.0* 5.6*  ALBUMIN 2.6* 2.7*   No results for input(s): LIPASE, AMYLASE in the last 72 hours. Cardiac Enzymes No results for  input(s): CKTOTAL, CKMB, CKMBINDEX, TROPONINI in the last 72 hours.  BNP: BNP (last 3 results) Recent Labs    12/12/16 1857  BNP 458.0*    ProBNP (last 3 results) No results for input(s): PROBNP in the last 8760 hours.   D-Dimer No results for input(s): DDIMER in the last 72 hours. Hemoglobin A1C No results for input(s): HGBA1C in the last 72 hours. Fasting Lipid Panel No results for input(s): CHOL, HDL, LDLCALC, TRIG, CHOLHDL, LDLDIRECT in the last 72 hours. Thyroid Function Tests No results for input(s): TSH, T4TOTAL, T3FREE, THYROIDAB in the last 72 hours.  Invalid input(s): FREET3  Other results:   Imaging   Dg Chest 2 View  Result Date: 12/24/2016 CLINICAL DATA:  History of CABG EXAM: CHEST  2 VIEW COMPARISON:  Three days ago FINDINGS: Interval removal of a thoracic drain. Stable right upper extremity PICC with tip at the upper right atrium. Small pleural effusions and atelectasis. Stable postoperative heart size. No visible pneumothorax. IMPRESSION: Small pleural  effusions and mild atelectasis. Electronically Signed   By: Monte Fantasia M.D.   On: 12/24/2016 06:39    Medications:     Scheduled Medications: . amiodarone  200 mg Oral BID  . aspirin EC  81 mg Oral Daily  . atorvastatin  40 mg Oral q1800  . bisacodyl  10 mg Oral Daily   Or  . bisacodyl  10 mg Rectal Daily  . Chlorhexidine Gluconate Cloth  6 each Topical Q0600  . clopidogrel  75 mg Oral Daily  . docusate sodium  200 mg Oral Daily  . enoxaparin (LOVENOX) injection  40 mg Subcutaneous Q24H  . ferrous [id number] C-folic acid  1 capsule Oral TID PC  . furosemide  40 mg Oral BID  . hydrocortisone   Rectal BID  . insulin aspart  0-15 Units Subcutaneous TID WC  . insulin aspart  0-5 Units Subcutaneous QHS  . ipratropium  0.5 mg Nebulization TID  . levalbuterol  1.25 mg Nebulization TID  . mometasone-formoterol  2 puff Inhalation BID  . pantoprazole  40 mg Oral Daily  . potassium chloride  40 mEq Oral BID  . sodium chloride flush  10-40 mL Intracatheter Q12H  . sodium chloride flush  3 mL Intravenous Q12H    Infusions: . lactated ringers Stopped (12/19/16 0918)  . lactated ringers Stopped (12/23/16 1128)  . magnesium sulfate 1 - 4 g bolus IVPB      PRN Medications: ALPRAZolam, hydrocortisone, Influenza vac split quadrivalent PF, levalbuterol, magnesium hydroxide, metoprolol tartrate, ondansetron (ZOFRAN) IV, oxyCODONE, sodium chloride flush, sodium chloride flush, traMADol    Patient Profile   Emily Rocha is a 61 y.o. female with h/o Asthma, COPD, tobacco abuse, HLD, PAD, carotid artery stenosis s/p R CEA, and GERD who presented to [facility] 12/12/16 with worsening dyspnea and CP.  Presented with Chest pain -> NSTEMI with no prior known cardiac disease. [place] 12/14/16 with severe disease. Taken for emergent CABG the same day.  CHF team consulted with difficulty weaning off milrinone and for GDMT.   Assessment/Plan   1. NSTEMI s/p emergent CABG 12/14/16 - Continues  to improve post op. No change.  - Continue IS.  - Continue PT.  - No BB with low output. - Continue ASA. Statin on hold with elevated LFTs, which are improving.  2. Acute on chronic systolic CHF due to ischemic cardiomyopathy - Echo 12/13/16 EF 45-50%  - Per Dr. Haroldine Laws Intra-Op TEE appears to have EF 25-30% with Mod/Sev  MR.  - Volume status improved. Continue lasix 40 mg po BID.  - Coox 57.4% off milrinone.  - Pressures up to 110s.  - Will cautiously resume spiro 12.5 qhs. - No losartan for now.  - Continue digoxin 0.125 mg daily.  - No BB with low output.  3. Hypotension  - Off neo. Continue to follow. Meds as above.  4. COPD with chronic hypoxic respiratory failure - Continue home meds and O2. No change.   5. Post op Afib - Converted with IV amio -> po amio - Continue po amio.   - Went back into Afib overnight 12/21/16 but converted again with IV amio. Milrinone likely exacerbating as well. Remains in NSR.  - No AC for now.  6. Hypokalemia - 3.1 this am.  Will increase supp to 40 mg BID.  7. Hyponatermia - 131 this am.  - Continue free water restrict.  8. Transaminitis - ? Passive congestion vs cardiogenic shock - Continues to improve.  9. Hypomagnesemia - Mg 1.7 this am. Will give 4 g.   Slowly improving. May be able to move off floor today. Will try and resume low dose spiro this evening.   Length of Stay: [address]  Annamaria Helling  12/24/2016, 8:17 AM  Advanced Heart Failure Team Pager [phone] (M-F; 7a - 4p)  Please contact [facility] for night-coverage after hours (4p -7a ) and weekends on [email or web address]   Patient seen and examined with the above-signed Advanced Practice Provider and/or Housestaff. I personally reviewed laboratory data, imaging studies and relevant notes. I independently examined the patient and formulated the important aspects of the plan. I have edited the note to reflect any of my changes or salient points. I have personally discussed  the plan with the patient and/or family.  Much improved today. Weight down 6 pounds. Breathing better. Maintaining NSR. Will switch oral lasix to torsemide 20 bid. Can increase as needed. Continue to ambulate. Her husband works this weekend so likely plan d/c on Monday when volume status is improved and she will have assistance at home.   Glori Bickers, MD  12:40 PM

## 2016-12-24 NOTE — Progress Notes (Signed)
Physical Therapy Treatment Patient Details Name: Emily Rocha MRN: [id number] DOB: 06/15/1955 Today's Date: 12/24/2016    History of Present Illness 61 y.o. female admitted with non-ST elevation myocardial infarction. Cardiac catheterization revealed severe coronary disease including distal left main. She was taken emergently for cardiac catheterization. Echocardiogram preoperatively showed ejection fraction 45-50%, mild aortic stenosis, mild to moderate mitral regurgitation and mild left atrial enlargement.  CABG x 3 on 12/15/16.  Pt with  hx of carotid artery stenosis s/p right CEA (12/06/14), former smoker, COPD, GERD, HLD, and PAD.     PT Comments     Pt in recliner upon arrival. Not on O2. Sats at 85% on room air. Notified nurse. Pt able to state all sternal precautions. Pt unable to perform sit to stand transfer off of commode while following sternal precautions; Pt mod A+2 for sit to stand transfer. During ambulation only required one standing rest break. O2 sats 87% on 3L [place] increased to and stayed >90% on 3L [place] for the rest of session. Pt will continue to benefit from PT to help build strength and increase independence with mobility.   Follow Up Recommendations  Home health PT;Supervision/Assistance - 24 hour     Equipment Recommendations  Rolling walker with 5" wheels;3in1 (PT)    Recommendations for Other Services       Precautions / Restrictions Precautions Precautions: Sternal;Fall Restrictions Weight Bearing Restrictions: Yes(sternal precautions) Other Position/Activity Restrictions: sternal precautions    Mobility  Bed Mobility               General bed mobility comments: Pt in recliner upon arrival   Transfers Overall transfer level: Needs assistance Equipment used: Rolling walker (2 wheeled) Transfers: Sit to/from Stand Sit to Stand: Min guard from recliner;Mod assist;+2 physical assistance(mod A +2 from toilet unable to follow sternal precautions due to  low height of toilet. )         General transfer comment: Min guard for safety and cues for UE when standing from recliner. Mod assist+2 for sit-to-stand transfer from toilet due to pt not being able to follow sternal precautions.   Ambulation/Gait Ambulation/Gait assistance: Min guard;Supervision Ambulation Distance (Feet): 340 Feet Assistive device: Rolling walker (2 wheeled) Gait Pattern/deviations: Step-through pattern Gait velocity: decreased   General Gait Details: Pt used RW during gait. O2 sats at 87% on 3L [place]. 1 rest break standing, Min VC needed to correct drifting left/right with walker while ambulating. O2 >90% on 3L [place] during the rest of session.    Stairs            Wheelchair Mobility    Modified Rankin (Stroke Patients Only)       Balance Overall balance assessment: Needs assistance;History of Falls Sitting-balance support: No upper extremity supported;Feet supported Sitting balance-Leahy Scale: Good Sitting balance - Comments: Pt able to sit erect and scoot back in recliner without LOB followed sternal precautions   Standing balance support: During functional activity;Single extremity supported Standing balance-Leahy Scale: Good Standing balance comment: Pt able to stand and wash hands at sink.  Uses RW for B UE support when SOB.                            Cognition Arousal/Alertness: Awake/alert Behavior During Therapy: WFL for tasks assessed/performed Overall Cognitive Status: Within Functional Limits for tasks assessed  General Comments: Pt was not able to state all of the sternal precautions today.       Exercises      General Comments        Pertinent Vitals/Pain Faces Pain Scale: Hurts little more Pain Descriptors / Indicators: Aching;Grimacing;Guarding Pain Intervention(s): Monitored during session    Home Living                      Prior Function             PT Goals (current goals can now be found in the care plan section) Acute Rehab PT Goals Patient Stated Goal: did not discuss Progress towards PT goals: Progressing toward goals    Frequency    Min 3X/week      PT Plan Current plan remains appropriate    Co-evaluation              AM-PAC PT "6 Clicks" Daily Activity  Outcome Measure  Difficulty turning over in bed (including adjusting bedclothes, sheets and blankets)?: A Lot Difficulty moving from lying on back to sitting on the side of the bed? : A Lot Difficulty sitting down on and standing up from a chair with arms (e.g., wheelchair, bedside commode, etc,.)?: A Little Help needed moving to and from a bed to chair (including a wheelchair)?: A Little Help needed walking in hospital room?: A Little Help needed climbing 3-5 steps with a railing? : A Lot 6 Click Score: 15    End of Session Equipment Utilized During Treatment: Gait belt;Oxygen Activity Tolerance: Patient tolerated treatment well Patient left: in chair;with call bell/phone within reach Nurse Communication: Mobility status PT Visit Diagnosis: Unsteadiness on feet (R26.81);Muscle weakness (generalized) (M62.81);Pain     Time: 1030-1100 PT Time Calculation (min) (ACUTE ONLY): 30 min  Charges:  $Gait Training: 8-22 mins $Therapeutic Activity: 8-22 mins                    G Codes:       Fransisca Connors, SPTA    Fransisca Connors 12/24/2016, 12:38 PM

## 2016-12-24 NOTE — Progress Notes (Signed)
EPW pulled per protocol and as ordered. All ends intact patient tolerated well patient reminded to lie supine approximately one hour. bp 116/73 heart rate 93Will monitor patient .Nithila Sumners, [employer]

## 2016-12-24 NOTE — Progress Notes (Signed)
Patient requesting medication for nausea. zofran given as ordered as needed for nausea. Emily Rocha, Bettina Gavia rN

## 2016-12-25 DIAGNOSIS — I5043 Acute on chronic combined systolic (congestive) and diastolic (congestive) heart failure: Secondary | ICD-10-CM

## 2016-12-25 LAB — CBC
HCT: 29.1 % — ABNORMAL LOW (ref 36.0–46.0)
Hemoglobin: 9.1 g/dL — ABNORMAL LOW (ref 12.0–15.0)
MCH: 27.7 pg (ref 26.0–34.0)
MCHC: 31.3 g/dL (ref 30.0–36.0)
MCV: 88.4 fL (ref 78.0–100.0)
Platelets: 369 10*3/uL (ref 150–400)
RBC: 3.29 MIL/uL — ABNORMAL LOW (ref 3.87–5.11)
RDW: 20 % — ABNORMAL HIGH (ref 11.5–15.5)
WBC: 11 10*3/uL — ABNORMAL HIGH (ref 4.0–10.5)

## 2016-12-25 LAB — BASIC METABOLIC PANEL
Anion gap: 9 (ref 5–15)
BUN: 5 mg/dL — ABNORMAL LOW (ref 6–20)
CO2: 44 mmol/L — ABNORMAL HIGH (ref 22–32)
Calcium: 8.4 mg/dL — ABNORMAL LOW (ref 8.9–10.3)
Chloride: 77 mmol/L — ABNORMAL LOW (ref 101–111)
Creatinine, Ser: 0.56 mg/dL (ref 0.44–1.00)
GFR calc Af Amer: >60 mL/min (ref >60)
GFR calc non Af Amer: >60 mL/min (ref >60)
Glucose, Bld: 103 mg/dL — ABNORMAL HIGH (ref 65–99)
Potassium: 3.4 mmol/L — ABNORMAL LOW (ref 3.5–5.1)
Sodium: 130 mmol/L — ABNORMAL LOW (ref 135–145)

## 2016-12-25 LAB — COOXEMETRY PANEL
Carboxyhemoglobin: 1.7 % — ABNORMAL HIGH (ref 0.5–1.5)
Methemoglobin: 1.1 % (ref 0.0–1.5)
O2 Saturation: 57.2 %
Total hemoglobin: 9.9 g/dL — ABNORMAL LOW (ref 12.0–16.0)

## 2016-12-25 LAB — MAGNESIUM: Magnesium: 2.1 mg/dL (ref 1.7–2.4)

## 2016-12-25 MED ORDER — ALBUTEROL SULFATE HFA 108 (90 BASE) MCG/ACT IN AERS
2.0000 | INHALATION_SPRAY | RESPIRATORY_TRACT | Status: DC | PRN
  Filled 2016-12-25: qty 6.7

## 2016-12-25 MED ORDER — FUROSEMIDE 10 MG/ML IJ SOLN
40.0000 mg | Freq: Three times a day (TID) | INTRAMUSCULAR | Status: DC
  Administered 2016-12-25 – 2016-12-26 (×3): 40 mg via INTRAVENOUS
  Filled 2016-12-25 (×3): qty 4

## 2016-12-25 MED ORDER — ALBUTEROL SULFATE (2.5 MG/3ML) 0.083% IN NEBU: 2.5000 mg | INHALATION_SOLUTION | RESPIRATORY_TRACT | Status: DC

## 2016-12-25 MED ORDER — POTASSIUM CHLORIDE CRYS ER 20 MEQ PO TBCR
40.0000 meq | EXTENDED_RELEASE_TABLET | Freq: Once | ORAL | Status: AC
  Administered 2016-12-25: 40 meq via ORAL
  Filled 2016-12-25: qty 2

## 2016-12-25 MED ORDER — ALTEPLASE 2 MG IJ SOLR
2.0000 mg | Freq: Once | INTRAMUSCULAR | Status: AC
  Administered 2016-12-25: 2 mg

## 2016-12-25 MED ORDER — LOSARTAN POTASSIUM 25 MG PO TABS
12.5000 mg | ORAL_TABLET | Freq: Every day | ORAL | Status: DC
  Administered 2016-12-25 – 2016-12-27 (×3): 12.5 mg via ORAL
  Filled 2016-12-25 (×3): qty 1

## 2016-12-25 NOTE — Progress Notes (Signed)
Pt stated feels SOB, pt is on 2LNC O2 Sats 95%. Reassured pt, who now feels better. Instructed that we will attempt a walk test later this evening and is agreeable.

## 2016-12-25 NOTE — Progress Notes (Signed)
CARDIAC REHAB PHASE I   PRE:  Rate/Rhythm: 74  BP:   Sitting: 121/60     SaO2: 95 on 2.5L  MODE:  Ambulation: 316 ft   POST:  Rate/Rhythm: 88  BP:   Sitting: 116/67     SaO2: 96 2.5L  1258-150  Pt ambulated 34ft with 2 person A, rolling walker, and O2. Attempted to wean O2, pt desaturated to 76%RA, encouraged PLB and rest breaks Pt O2 increased up to 82-84% RA. Added supplemental O2 at 2L and pt slowly increase to 88-90%. Pt c/o of lung pain from PLB/ Increased O2 to 3L, O2 increased to 94-96%. Continued with walk, pt maintained over 95% with walking. Pt went to restroom independently with O2. Returned to recliner with VSS and LE elevated. Pt is eager to move and ambulate independently. Encouraged walking 3-4 times a day and notify RN when ready to walk. Education completed in which we discussed [place] diet, risk factors, lifting/ activity restrictions. sternal precautions, wound care, exercise guidelines and emergency action. Pt voiced understanding and receptive to education. Referral sent to [facility].   Jamare Vanatta D Everhett Bozard,MS,ACSM-RCEP 12/25/2016 1:49 PM

## 2016-12-25 NOTE — Progress Notes (Signed)
Patient ID: Emily Rocha, female   DOB: Nov 13, 1955, 61 y.o.   MRN: [id number]     Advanced Heart Failure Rounding Note  PCP: Dr. Woody Seller Primary Cardiologist: New (Dr. Percival Spanish) HF: New (Dr. Haroldine Laws)  Subjective:    Developed Afib overnight 12/21/16. Converted with IV amio bolus.  Massive diuresis noted. IV lasix stopped with hypotension and started on Neosynephrine at 30.   Off Neo and off milrinone. Co-ox 57% today.   She is more short of breath today.  I/Os not recorded.  Weight unchanged.  SBP 100s-120s.  Echo 12/20/16 LVEF 45-50%, Mild/Mod AS  Objective:   Weight Range: 191 lb 14.4 oz (87 kg) Body mass index is 32.94 kg/m.   Vital Signs:   Temp:  [98.1 F (36.7 C)-98.8 F (37.1 C)] 98.6 F (37 C) (11/10 4098) Pulse Rate:  [80-98] 92 (11/10 0951) Resp:  [12-24] 22 (11/10 0951) BP: (98-127)/(47-73) 107/63 (11/10 0822) SpO2:  [92 %-99 %] 96 % (11/10 0951) FiO2 (%):  [28 %] 28 % (11/09 1314) Weight:  [191 lb 14.4 oz (87 kg)] 191 lb 14.4 oz (87 kg) (11/10 0249) Last BM Date: 12/24/16  Weight change: Filed Weights   12/23/16 0500 12/24/16 0500 12/25/16 0249  Weight: 197 lb 1.5 oz (89.4 kg) 191 lb 12.8 oz (87 kg) 191 lb 14.4 oz (87 kg)   Intake/Output:  No intake or output data in the 24 hours ending 12/25/16 1048  Physical Exam    General: Mild tachypnea.  Neck: JVP 12-14 cm, no thyromegaly or thyroid nodule.  Lungs: Crackles at bases bilaterally.  CV: Nondisplaced PMI.  Heart regular S1/S2, no S3/S4, 2/6 SEM RUSB.  Trace ankle edema.   Abdomen: Soft, nontender, no hepatosplenomegaly, no distention.  Skin: Intact without lesions or rashes.  Neurologic: Alert and oriented x 3.  Psych: Normal affect. Extremities: No clubbing or cyanosis.  HEENT: Normal.    Telemetry   NSR around 100, personally reviewed.   EKG    12/15/16 NSR 69 bpm  Labs    CBC Recent Labs    12/24/16 0415 12/25/16 0441  WBC 9.9 11.0*  HGB 9.5* 9.1*  HCT 30.2* 29.1*  MCV 87.8 88.4   PLT 356 119   Basic Metabolic Panel Recent Labs    12/24/16 0415 12/25/16 0441  NA 131* 130*  K 3.1* 3.4*  CL 79* 77*  CO2 42* 44*  GLUCOSE 96 103*  BUN 6 5*  CREATININE 0.49 0.56  CALCIUM 8.7* 8.4*  MG 1.7 2.1   Liver Function Tests Recent Labs    12/24/16 0415  AST 28  ALT 74*  ALKPHOS 99  BILITOT 1.0  PROT 5.6*  ALBUMIN 2.7*   No results for input(s): LIPASE, AMYLASE in the last 72 hours. Cardiac Enzymes No results for input(s): CKTOTAL, CKMB, CKMBINDEX, TROPONINI in the last 72 hours.  BNP: BNP (last 3 results) Recent Labs    12/12/16 1857  BNP 458.0*    ProBNP (last 3 results) No results for input(s): PROBNP in the last 8760 hours.   D-Dimer No results for input(s): DDIMER in the last 72 hours. Hemoglobin A1C No results for input(s): HGBA1C in the last 72 hours. Fasting Lipid Panel No results for input(s): CHOL, HDL, LDLCALC, TRIG, CHOLHDL, LDLDIRECT in the last 72 hours. Thyroid Function Tests No results for input(s): TSH, T4TOTAL, T3FREE, THYROIDAB in the last 72 hours.  Invalid input(s): FREET3  Other results:   Imaging   No results found.  Medications:  Scheduled Medications: . amiodarone  200 mg Oral BID  . aspirin EC  81 mg Oral Daily  . atorvastatin  40 mg Oral q1800  . bisacodyl  10 mg Oral Daily   Or  . bisacodyl  10 mg Rectal Daily  . Chlorhexidine Gluconate Cloth  6 each Topical Q0600  . clopidogrel  75 mg Oral Daily  . docusate sodium  200 mg Oral Daily  . enoxaparin (LOVENOX) injection  40 mg Subcutaneous Q24H  . ferrous [id number] C-folic acid  1 capsule Oral TID PC  . furosemide  40 mg Intravenous Q8H  . hydrocortisone   Rectal BID  . ipratropium  0.5 mg Nebulization TID  . levalbuterol  1.25 mg Nebulization TID  . losartan  12.5 mg Oral Daily  . mometasone-formoterol  2 puff Inhalation BID  . pantoprazole  40 mg Oral Daily  . potassium chloride  40 mEq Oral BID  . potassium chloride  40 mEq Oral  Once  . sodium chloride flush  10-40 mL Intracatheter Q12H  . sodium chloride flush  3 mL Intravenous Q12H  . spironolactone  12.5 mg Oral QHS  . torsemide  20 mg Oral BID    Infusions: . lactated ringers Stopped (12/19/16 0918)  . lactated ringers Stopped (12/23/16 1128)    PRN Medications: albuterol, ALPRAZolam, hydrocortisone, Influenza vac split quadrivalent PF, magnesium hydroxide, metoprolol tartrate, ondansetron (ZOFRAN) IV, oxyCODONE, sodium chloride flush, sodium chloride flush, traMADol    Patient Profile   Emily Rocha is a 61 y.o. female with h/o Asthma, COPD, tobacco abuse, HLD, PAD, carotid artery stenosis s/p R CEA, and GERD who presented to [facility] 12/12/16 with worsening dyspnea and CP.  Presented with Chest pain -> NSTEMI with no prior known cardiac disease. [place] 12/14/16 with severe disease. Taken for emergent CABG the same day.  CHF team consulted with difficulty weaning off milrinone and for GDMT.   Assessment/Plan   1. NSTEMI s/p emergent CABG 12/14/16: Stable on atorvastatin, ASA and Plavix.   2. Acute on chronic systolic CHF due to ischemic cardiomyopathy: Echo 12/13/16 EF 45-50%.  Per Dr. Haroldine Laws Intra-Op TEE appears to have EF 25-30% with Mod/Sev MR.  Today, she is more short of breath and looks volume overloaded on exam.  Co-ox 57% off milrinone.  - Stop torsemide, start Lasix 40 mg IV every 8 hrs and supplement K.   - Continue spironolactone 12.5 daily.  - Add losartan 12.5 daily.  - No BB with marginal CO.  - Follow strict I/Os.  3. Hypotension: Resolved.   4. COPD with chronic hypoxic respiratory failure - Continue home meds and O2. No change.   5. Post op Afib: Converted with IV amio -> po amio.  Remains in NSR.  - Continue po amio.   - No anticoagulation for now.  6. Hypokalemia: Replace K with diuresis.   7. Hyponatremia: Mild.  - Continue free water restrict.   Loralie Champagne 12/25/2016

## 2016-12-25 NOTE — Progress Notes (Addendum)
      [address]       [place] [address]             [phone]      11 Days Post-Op Procedure(s) (LRB): CORONARY ARTERY BYPASS GRAFTING (CABG) x three , using left internal mammary artery and right leg greater saphenous vein harvested endoscopically (N/A) TRANSESOPHAGEAL ECHOCARDIOGRAM (TEE) (N/A)   Subjective:  States doing much better.  She states she is short of breath this morning, but overall she feels like she is breathing much better.  She asks if we can order her ventolin which she uses at home for rescue inhaler.  She has some in the room which has run out.  + ambulation  + BM  Objective: Vital signs in last 24 hours: Temp:  [97.7 F (36.5 C)-98.8 F (37.1 C)] 98.1 F (36.7 C) (11/10 0419) Pulse Rate:  [80-98] 98 (11/10 0249) Cardiac Rhythm: Normal sinus rhythm (11/10 0701) Resp:  [12-22] 13 (11/10 0249) BP: (98-127)/(47-76) 104/65 (11/10 0419) SpO2:  [92 %-99 %] 92 % (11/10 0249) FiO2 (%):  [28 %] 28 % (11/09 1314) Weight:  [191 lb 14.4 oz (87 kg)] 191 lb 14.4 oz (87 kg) (11/10 0249)  Intake/Output from previous day: 11/09 0701 - 11/10 0700 In: 100 [IV Piggyback:100] Out: 150 [Urine:150]  General appearance: alert, cooperative and no distress Heart: regular rate and rhythm Lungs: clear to auscultation bilaterally Abdomen: soft, non-tender; bowel sounds normal; no masses,  no organomegaly Extremities: edema 1+, improved Wound: clean and dry  Lab Results: Recent Labs    12/24/16 0415 12/25/16 0441  WBC 9.9 11.0*  HGB 9.5* 9.1*  HCT 30.2* 29.1*  PLT 356 369   BMET:  Recent Labs    12/24/16 0415 12/25/16 0441  NA 131* 130*  K 3.1* 3.4*  CL 79* 77*  CO2 42* 44*  GLUCOSE 96 103*  BUN 6 5*  CREATININE 0.49 0.56  CALCIUM 8.7* 8.4*    PT/INR: No results for input(s): LABPROT, INR in the last 72 hours. ABG    Component Value Date/Time   PHART 7.421 12/16/2016 0306   HCO3 26.7 12/16/2016 0306   TCO2 37 (H) 12/22/2016 1514   O2SAT 57.2 12/25/2016 0505   CBG (last 3)  Recent Labs    12/23/16 1610 12/23/16 2129 12/24/16 0757  GLUCAP 84 103* 105*    Assessment/Plan: S/P Procedure(s) (LRB): CORONARY ARTERY BYPASS GRAFTING (CABG) x three , using left internal mammary artery and right leg greater saphenous vein harvested endoscopically (N/A) TRANSESOPHAGEAL ECHOCARDIOGRAM (TEE) (N/A)  1.  CV- hemodynamically stable, off all drips, Co-ox is stable at 57.2 continue Amiodarone 2. Pulm- down to 2L of oxygen, underlying COPD and Asthma- continue aggressive pulmonary toilet.. Will have nursing document oxygenation in case home oxygen is needed 3. Renal-creatinine remains stable, weight is trending down, diuretics per AHF 4. Hypokalemia- improving, continue supplementation and monitor as she is taking Aldactone 5. Dispo- patient remains stable off all drips, making progress, continue diuretics, aggressive pulmonary toilet, home Monday if remains clinically stable   LOS: 13 days    [name], ERIN 12/25/2016 Patient seen and examined, agree with above  Remo Lipps C. [name], MD [facility] [phone]

## 2016-12-26 LAB — BASIC METABOLIC PANEL
Anion gap: 9 (ref 5–15)
BUN: 7 mg/dL (ref 6–20)
CO2: 45 mmol/L — ABNORMAL HIGH (ref 22–32)
Calcium: 8.5 mg/dL — ABNORMAL LOW (ref 8.9–10.3)
Chloride: 76 mmol/L — ABNORMAL LOW (ref 101–111)
Creatinine, Ser: 0.63 mg/dL (ref 0.44–1.00)
GFR calc Af Amer: >60 mL/min (ref >60)
GFR calc non Af Amer: >60 mL/min (ref >60)
Glucose, Bld: 116 mg/dL — ABNORMAL HIGH (ref 65–99)
Potassium: 3.6 mmol/L (ref 3.5–5.1)
Sodium: 130 mmol/L — ABNORMAL LOW (ref 135–145)

## 2016-12-26 LAB — CBC
HCT: 30.2 % — ABNORMAL LOW (ref 36.0–46.0)
Hemoglobin: 9.2 g/dL — ABNORMAL LOW (ref 12.0–15.0)
MCH: 27.4 pg (ref 26.0–34.0)
MCHC: 30.5 g/dL (ref 30.0–36.0)
MCV: 89.9 fL (ref 78.0–100.0)
Platelets: 390 K/uL (ref 150–400)
RBC: 3.36 MIL/uL — ABNORMAL LOW (ref 3.87–5.11)
RDW: 20.5 % — ABNORMAL HIGH (ref 11.5–15.5)
WBC: 12.3 K/uL — ABNORMAL HIGH (ref 4.0–10.5)

## 2016-12-26 LAB — COOXEMETRY PANEL
Carboxyhemoglobin: 1.8 % — ABNORMAL HIGH (ref 0.5–1.5)
Methemoglobin: 1 % (ref 0.0–1.5)
O2 Saturation: 99.5 %
Total hemoglobin: 9.5 g/dL — ABNORMAL LOW (ref 12.0–16.0)

## 2016-12-26 MED ORDER — FUROSEMIDE 10 MG/ML IJ SOLN: 40.0000 mg | Freq: Two times a day (BID) | INTRAMUSCULAR | Status: DC

## 2016-12-26 MED ORDER — FUROSEMIDE 10 MG/ML IJ SOLN
40.0000 mg | Freq: Two times a day (BID) | INTRAMUSCULAR | Status: DC
  Administered 2016-12-26 – 2016-12-27 (×2): 40 mg via INTRAVENOUS
  Filled 2016-12-26 (×2): qty 4

## 2016-12-26 NOTE — Progress Notes (Signed)
Pt ambulated 470 ft with 3 brief standing rest stops.  Practiced pushing own oxygen tank during first half of walk.  Back to recliner for dinner after walk.  Will con't plan of care.

## 2016-12-26 NOTE — Progress Notes (Addendum)
      [address]       [place] [address]             [phone]      12 Days Post-Op Procedure(s) (LRB): CORONARY ARTERY BYPASS GRAFTING (CABG) x three , using left internal mammary artery and right leg greater saphenous vein harvested endoscopically (N/A) TRANSESOPHAGEAL ECHOCARDIOGRAM (TEE) (N/A)   Subjective:  No new complaints.  Continues to feel better every day.  + ambulation  + BM  Objective: Vital signs in last 24 hours: Temp:  [97.8 F (36.6 C)-99.5 F (37.5 C)] 99.4 F (37.4 C) (11/11 0457) Pulse Rate:  [65-94] 92 (11/11 0041) Cardiac Rhythm: Normal sinus rhythm (11/10 1900) Resp:  [13-27] 13 (11/10 1655) BP: (91-118)/(45-63) 101/57 (11/11 0457) SpO2:  [92 %-100 %] 92 % (11/11 0041) Weight:  [182 lb (82.6 kg)] 182 lb (82.6 kg) (11/11 0457)  Intake/Output from previous day: 11/10 0701 - 11/11 0700 In: 120 [P.O.:120] Out: 850 [Urine:850]  General appearance: alert, cooperative and no distress Heart: regular rate and rhythm Lungs: diminished breath sounds bibasilar Abdomen: soft, non-tender; bowel sounds normal; no masses,  no organomegaly Extremities: edema trace, improving Wound: clean and dry  Lab Results: Recent Labs    12/25/16 0441 12/26/16 0500  WBC 11.0* 12.3*  HGB 9.1* 9.2*  HCT 29.1* 30.2*  PLT 369 390   BMET:  Recent Labs    12/25/16 0441 12/26/16 0500  NA 130* 130*  K 3.4* 3.6  CL 77* 76*  CO2 44* 45*  GLUCOSE 103* 116*  BUN 5* 7  CREATININE 0.56 0.63  CALCIUM 8.4* 8.5*    PT/INR: No results for input(s): LABPROT, INR in the last 72 hours. ABG    Component Value Date/Time   PHART 7.421 12/16/2016 0306   HCO3 26.7 12/16/2016 0306   TCO2 37 (H) 12/22/2016 1514   O2SAT 99.5 12/26/2016 0536   CBG (last 3)  Recent Labs    12/23/16 1610 12/23/16 2129 12/24/16 0757  GLUCAP 84 103* 105*    Assessment/Plan: S/P Procedure(s) (LRB): CORONARY ARTERY BYPASS GRAFTING (CABG) x three , using left internal  mammary artery and right leg greater saphenous vein harvested endoscopically (N/A) TRANSESOPHAGEAL ECHOCARDIOGRAM (TEE) (N/A)  1. CV- NSR, BP remains labile- off all drips, Coox 99 this morning which is inaccurate- on Cozaar and Amiodarone 2. Pulm- severe COPD/Asthma, weaning oxygen as tolerated, will likely require home use, continue nebs, IS 3. Renal- creatinine stable,- weight is down 9lbs from yesterday (if accurate)- continue diuretics per AHF 4. Hypokalemia- resolved, continue to monitor with use of Aldactone 5. Dispo- patient stable, continue diuretics per AHF, continue aggressive pulmonary toilet, may be ready for d/c in AM if stable from AHF perspective   LOS: 14 days    [name], ERIN 12/26/2016 Patient seen and examined, agree with above  Remo Lipps C. [name], MD [facility] [phone]

## 2016-12-26 NOTE — Discharge Instructions (Signed)
Coronary Artery Bypass Grafting, Care After ° °This sheet gives you information about how to care for yourself after your procedure. Your health care provider may also give you more specific instructions. If you have problems or questions, contact your health care provider. °What can I expect after the procedure? °After the procedure, it is common to have: °· Nausea and a lack of appetite. °· Constipation. °· Weakness and fatigue. °· Depression or irritability. °· Pain or discomfort in your incision areas. ° °Follow these instructions at home: °Medicines °· Take over-the-counter and prescription medicines only as told by your health care provider. Do not stop taking medicines or start any new medicines without approval from your health care provider. °· If you were prescribed an antibiotic medicine, take it as told by your health care provider. Do not stop taking the antibiotic even if you start to feel better. °· Do not drive or use heavy machinery while taking prescription pain medicine. °Incision care °· Follow instructions from your health care provider about how to take care of your incisions. Make sure you: °? Wash your hands with soap and water before you change your bandage (dressing). If soap and water are not available, use hand sanitizer. °? Change your dressing as told by your health care provider. °? Leave stitches (sutures), skin glue, or adhesive strips in place. These skin closures may need to stay in place for 2 weeks or longer. If adhesive strip edges start to loosen and curl up, you may trim the loose edges. Do not remove adhesive strips completely unless your health care provider tells you to do that. °· Keep incision areas clean, dry, and protected. °· Check your incision areas every day for signs of infection. Check for: °? More redness, swelling, or pain. °? More fluid or blood. °? Warmth. °? Pus or a bad smell. °· If incisions were made in your legs: °? Avoid crossing your legs. °? Avoid  sitting for long periods of time. Change positions every 30 minutes. °? Raise (elevate) your legs when you are sitting. °Bathing °· Do not take baths, swim, or use a hot tub until your health care provider approves. °· Only take sponge baths. Pat the incisions dry. Do not rub incisions with a washcloth or towel. °· Ask your health care provider when you can shower. °Eating and drinking °· Eat foods that are high in fiber, such as raw fruits and vegetables, whole grains, beans, and nuts. Meats should be lean cut. Avoid canned, processed, and fried foods. This can help prevent constipation and is a recommended part of a heart-healthy diet. °· Drink enough fluid to keep your urine clear or pale yellow. °· Limit alcohol intake to no more than 1 drink a day for nonpregnant women and 2 drinks a day for men. One drink equals 12 oz of beer, 5 oz of wine, or 1½ oz of hard liquor. °Activity °· Rest and limit your activity as told by your health care provider. You may be instructed to: °? Stop any activity right away if you have chest pain, shortness of breath, irregular heartbeats, or dizziness. Get help right away if you have any of these symptoms. °? Move around frequently for short periods or take short walks as directed by your health care provider. Gradually increase your activities. You may need physical therapy or cardiac rehabilitation to help strengthen your muscles and build your endurance. °? Avoid lifting, pushing, or pulling anything that is heavier than 10 lb (4.5 kg) for   at least 6 weeks or as told by your health care provider. °· Do not drive until your health care provider approves. °· Ask your health care provider when you may return to work. °· Ask your health care provider when you may resume sexual activity. °General instructions °· Do not use any products that contain nicotine or tobacco, such as cigarettes and e-cigarettes. If you need help quitting, ask your health care provider. °· Take 2-3 deep  breaths every few hours during the day, while you recover. This helps expand your lungs and prevent complications like pneumonia after surgery. °· If you were given a device called an incentive spirometer, use it several times a day to practice deep breathing. Support your chest with a pillow or your arms when you take deep breaths or cough. °· Wear compression stockings as told by your health care provider. These stockings help to prevent blood clots and reduce swelling in your legs. °· Weigh yourself every day. This helps identify if your body is holding (retaining) fluid that may make your heart and lungs work harder. °· Keep all follow-up visits as told by your health care provider. This is important. °Contact a health care provider if: °· You have more redness, swelling, or pain around any incision. °· You have more fluid or blood coming from any incision. °· Any incision feels warm to the touch. °· You have pus or a bad smell coming from any incision °· You have a fever. °· You have swelling in your ankles or legs. °· You have pain in your legs. °· You gain 2 lb (0.9 kg) or more a day. °· You are nauseous or you vomit. °· You have diarrhea. °Get help right away if: °· You have chest pain that spreads to your jaw or arms. °· You are short of breath. °· You have a fast or irregular heartbeat. °· You notice a "clicking" in your breastbone (sternum) when you move. °· You have numbness or weakness in your arms or legs. °· You feel dizzy or light-headed. °Summary °· After the procedure, it is common to have pain or discomfort in the incision areas. °· Do not take baths, swim, or use a hot tub until your health care provider approves. °· Gradually increase your activities. You may need physical therapy or cardiac rehabilitation to help strengthen your muscles and build your endurance. °· Weigh yourself every day. This helps identify if your body is holding (retaining) fluid that may make your heart and lungs work  harder. °This information is not intended to replace advice given to you by your health care provider. Make sure you discuss any questions you have with your health care provider. °Document Released: 08/21/2004 Document Revised: 12/22/2015 Document Reviewed: 12/22/2015 °Elsevier Interactive Patient Education © 2018 [employer] ° °

## 2016-12-26 NOTE — Care Management Note (Addendum)
Case Management Note Previous CM note completed by Zenon Mayo, RN--12/15/2016, 4:16 PM  Patient Details  Name: Emily Rocha MRN: [id number] Date of Birth: 21-Sep-1955  Subjective/Objective:    From home with spouse, presents with chest pain, NSTEMI, POD 1 Emergent CABG, conts with chest tubes, extubated, cont to diuresis.  Patient's spouse works every other weekend and will be able to assist patient at home,  She also has a son who works 8 am to 4 pm but he can also assist her as well.    11/2 1056 Tomi Bamberger RN, BSN -  POD 3 CABG, conts on epi and milrinone, conts with chest tube x 2.     11/7 [place], BSN - NCM gave patient the [facility] agency list, she would like to look over it and talk to some of her family members before she chooses an agency.  Left the list with her and will check back with her later. She will need HHPT and rolling walker at discharge.   11/8 [place], BSN - Patient wants to know what is the co pay amount for the HHPT and DME rolling walker before she chooses agency.  NCM awaiting benefit check.              Action/Plan: NCM will follow for dc needs.   Expected Discharge Date:  12/27/16               Expected Discharge Plan:  [place]  In-House Referral:     Discharge planning Services  CM Consult  Post Acute Care Choice:  Durable Medical Equipment, Home Health Choice offered to:  Patient  DME Arranged:  Oxygen, RW, 3n1 DME Agency:  [place]:  RN [facility] Agency:  [place]  Status of Service:  Completed, signed off  If discussed at [place] of Stay Meetings, dates discussed:    Discharge Disposition: home/home health   Additional Comments:  12/26/16- 1400- Katelyn Kohlmeyer RN CM- pt seen for f/u discharge needs- pt has chosen [facility] for [facility] agency of choice for [facility] and DME needs- orders have been placed for home 02 and HHRN- per conversation  with pt she has RW and elevated toilet seat at home already- does not need 3n1- referral for home 02 and HHRN called to [facility] with [facility]- portable 02 tank to be delivered to room prior to discharge. HHRN will be set up within 48 hr post discharge.   Dahlia Client Elwin, RN 12/26/2016, 2:09 PM [phone]

## 2016-12-26 NOTE — Progress Notes (Signed)
Patient ID: Emily Rocha, female   DOB: [birth year], 61 y.o.   MRN: [id number]      Advanced Heart Failure Rounding Note  PCP: Dr. Woody Seller Primary Cardiologist: New (Dr. Percival Spanish) HF: New (Dr. Haroldine Laws)  Subjective:    Developed Afib overnight 12/21/16. Converted with IV amio bolus.  Massive diuresis noted. IV lasix stopped with hypotension and started on Neosynephrine at 30.   Off Neo and off milrinone. Yesterday, she was much more short of breath and volume overloaded.  She was restarted on IV Lasix.    She is breathing much better today.  Unfortunately, her I/Os are not accurate and I'm not sure that weight is correct (suggests she is down 9 lbs).  However, think she did diurese vigorously. SBP 90s-100s.  Co-ox is not accurate.   Echo 12/20/16 LVEF 45-50%, Mild/Mod AS  Objective:   Weight Range: 182 lb (82.6 kg) Body mass index is 31.24 kg/m.   Vital Signs:   Temp:  [97.8 F (36.6 C)-99.5 F (37.5 C)] 99.4 F (37.4 C) (11/11 0457) Pulse Rate:  [65-94] 92 (11/11 0041) Resp:  [13-27] 13 (11/10 1655) BP: (91-118)/(45-63) 101/57 (11/11 0457) SpO2:  [92 %-100 %] 92 % (11/11 0041) Weight:  [182 lb (82.6 kg)] 182 lb (82.6 kg) (11/11 0457) Last BM Date: 12/24/16  Weight change: Filed Weights   12/24/16 0500 12/25/16 0249 12/26/16 0457  Weight: 191 lb 12.8 oz (87 kg) 191 lb 14.4 oz (87 kg) 182 lb (82.6 kg)   Intake/Output:   Intake/Output Summary (Last 24 hours) at 12/26/2016 0701 Last data filed at 12/26/2016 0600 Gross per 24 hour  Intake 120 ml  Output 850 ml  Net -730 ml    Physical Exam    General: NAD Neck: JVP 10 cm, no thyromegaly or thyroid nodule.  Lungs: Mild crackles at bases.  CV: Nondisplaced PMI.  Heart regular S1/S2, no S3/S4, 2/6 SEM RUSB.  Trace ankle edema.   Abdomen: Soft, nontender, no hepatosplenomegaly, no distention.  Skin: Intact without lesions or rashes.  Neurologic: Alert and oriented x 3.  Psych: Normal affect. Extremities: No clubbing or  cyanosis.  HEENT: Normal.    Telemetry   NSR 80s, personally reviewed.   EKG    12/15/16 NSR 69 bpm  Labs    CBC Recent Labs    12/25/16 0441 12/26/16 0500  WBC 11.0* 12.3*  HGB 9.1* 9.2*  HCT 29.1* 30.2*  MCV 88.4 89.9  PLT 369 673   Basic Metabolic Panel Recent Labs    12/24/16 0415 12/25/16 0441 12/26/16 0500  NA 131* 130* 130*  K 3.1* 3.4* 3.6  CL 79* 77* 76*  CO2 42* 44* 45*  GLUCOSE 96 103* 116*  BUN 6 5* 7  CREATININE 0.49 0.56 0.63  CALCIUM 8.7* 8.4* 8.5*  MG 1.7 2.1  --    Liver Function Tests Recent Labs    12/24/16 0415  AST 28  ALT 74*  ALKPHOS 99  BILITOT 1.0  PROT 5.6*  ALBUMIN 2.7*   No results for input(s): LIPASE, AMYLASE in the last 72 hours. Cardiac Enzymes No results for input(s): CKTOTAL, CKMB, CKMBINDEX, TROPONINI in the last 72 hours.  BNP: BNP (last 3 results) Recent Labs    12/12/16 1857  BNP 458.0*    ProBNP (last 3 results) No results for input(s): PROBNP in the last 8760 hours.   D-Dimer No results for input(s): DDIMER in the last 72 hours. Hemoglobin A1C No results for input(s): HGBA1C in the  last 72 hours. Fasting Lipid Panel No results for input(s): CHOL, HDL, LDLCALC, TRIG, CHOLHDL, LDLDIRECT in the last 72 hours. Thyroid Function Tests No results for input(s): TSH, T4TOTAL, T3FREE, THYROIDAB in the last 72 hours.  Invalid input(s): FREET3  Other results:   Imaging   No results found.  Medications:     Scheduled Medications: . amiodarone  200 mg Oral BID  . aspirin EC  81 mg Oral Daily  . atorvastatin  40 mg Oral q1800  . bisacodyl  10 mg Oral Daily   Or  . bisacodyl  10 mg Rectal Daily  . Chlorhexidine Gluconate Cloth  6 each Topical Q0600  . clopidogrel  75 mg Oral Daily  . docusate sodium  200 mg Oral Daily  . enoxaparin (LOVENOX) injection  40 mg Subcutaneous Q24H  . ferrous [id number] C-folic acid  1 capsule Oral TID PC  . furosemide  40 mg Intravenous Q8H  .  hydrocortisone   Rectal BID  . ipratropium  0.5 mg Nebulization TID  . levalbuterol  1.25 mg Nebulization TID  . losartan  12.5 mg Oral Daily  . mometasone-formoterol  2 puff Inhalation BID  . pantoprazole  40 mg Oral Daily  . potassium chloride  40 mEq Oral BID  . sodium chloride flush  10-40 mL Intracatheter Q12H  . sodium chloride flush  3 mL Intravenous Q12H  . spironolactone  12.5 mg Oral QHS  . torsemide  20 mg Oral BID    Infusions: . lactated ringers Stopped (12/19/16 0918)  . lactated ringers Stopped (12/23/16 1128)    PRN Medications: albuterol, ALPRAZolam, hydrocortisone, Influenza vac split quadrivalent PF, magnesium hydroxide, metoprolol tartrate, ondansetron (ZOFRAN) IV, oxyCODONE, sodium chloride flush, sodium chloride flush, traMADol    Patient Profile   Emily Rocha is a 61 y.o. female with h/o Asthma, COPD, tobacco abuse, HLD, PAD, carotid artery stenosis s/p R CEA, and GERD who presented to [facility] 12/12/16 with worsening dyspnea and CP.  Presented with Chest pain -> NSTEMI with no prior known cardiac disease. [place] 12/14/16 with severe disease. Taken for emergent CABG the same day.  CHF team consulted with difficulty weaning off milrinone and for GDMT.   Assessment/Plan   1. NSTEMI s/p emergent CABG 12/14/16: Stable on atorvastatin, ASA and Plavix.   2. Acute on chronic systolic CHF due to ischemic cardiomyopathy: Echo 12/13/16 EF 45-50%.  Per Dr. Haroldine Laws Intra-Op TEE appears to have EF 25-30% with Mod/Sev MR.  Short of breath and volume overloaded yesterday, feels much better today with IV Lasix.  Still some volume overload on exam.  - Decrease Lasix to 40 mg IV bid today and will make sure I/Os recorded more accurately.  - Repeat co-ox (inaccurate).    - Continue spironolactone 12.5 daily.  - Continue losartan 12.5 mg daily, no BP room to titrate.  3. Hypotension: Resolved.   4. COPD with chronic hypoxic respiratory failure - Continue home meds and O2. No  change.   5. Post op Afib: Converted with IV amio -> po amio.  Remains in NSR.  - Continue po amio.   - No anticoagulation for now.  6. Hypokalemia: Replace K with diuresis.   7. Hyponatremia: Mild.  - Continue free water restrict.   Loralie Champagne 12/26/2016

## 2016-12-27 DIAGNOSIS — J441 Chronic obstructive pulmonary disease with (acute) exacerbation: Secondary | ICD-10-CM

## 2016-12-27 DIAGNOSIS — I5031 Acute diastolic (congestive) heart failure: Secondary | ICD-10-CM

## 2016-12-27 DIAGNOSIS — I200 Unstable angina: Secondary | ICD-10-CM

## 2016-12-27 LAB — CBC
HCT: 29.8 % — ABNORMAL LOW (ref 36.0–46.0)
Hemoglobin: 9.2 g/dL — ABNORMAL LOW (ref 12.0–15.0)
MCH: 27.7 pg (ref 26.0–34.0)
MCHC: 30.9 g/dL (ref 30.0–36.0)
MCV: 89.8 fL (ref 78.0–100.0)
Platelets: 406 10*3/uL — ABNORMAL HIGH (ref 150–400)
RBC: 3.32 MIL/uL — ABNORMAL LOW (ref 3.87–5.11)
RDW: 20.8 % — ABNORMAL HIGH (ref 11.5–15.5)
WBC: 9.5 10*3/uL (ref 4.0–10.5)

## 2016-12-27 LAB — BASIC METABOLIC PANEL
Anion gap: 10 (ref 5–15)
BUN: 8 mg/dL (ref 6–20)
CO2: 42 mmol/L — ABNORMAL HIGH (ref 22–32)
Calcium: 8.7 mg/dL — ABNORMAL LOW (ref 8.9–10.3)
Chloride: 78 mmol/L — ABNORMAL LOW (ref 101–111)
Creatinine, Ser: 0.5 mg/dL (ref 0.44–1.00)
GFR calc Af Amer: >60 mL/min (ref >60)
GFR calc non Af Amer: >60 mL/min (ref >60)
Glucose, Bld: 100 mg/dL — ABNORMAL HIGH (ref 65–99)
Potassium: 3.7 mmol/L (ref 3.5–5.1)
Sodium: 130 mmol/L — ABNORMAL LOW (ref 135–145)

## 2016-12-27 LAB — COOXEMETRY PANEL
Carboxyhemoglobin: 2.7 % — ABNORMAL HIGH (ref 0.5–1.5)
Methemoglobin: 0.7 % (ref 0.0–1.5)
O2 Saturation: 99.7 %
Total hemoglobin: 9.6 g/dL — ABNORMAL LOW (ref 12.0–16.0)

## 2016-12-27 MED ORDER — FERROUS SULFATE 325 (65 FE) MG PO TABS: 325.0000 mg | ORAL_TABLET | Freq: Two times a day (BID) | ORAL | 3 refills | Status: DC

## 2016-12-27 MED ORDER — FOLIC ACID 1 MG PO TABS: 1.0000 mg | ORAL_TABLET | Freq: Every day | ORAL | 1 refills | Status: DC

## 2016-12-27 MED ORDER — AMIODARONE HCL 200 MG PO TABS: 200.0000 mg | ORAL_TABLET | Freq: Two times a day (BID) | ORAL | 1 refills | Status: DC

## 2016-12-27 MED ORDER — FERROUS SULFATE 325 (65 FE) MG PO TABS
325.0000 mg | ORAL_TABLET | Freq: Two times a day (BID) | ORAL | Status: DC
  Administered 2016-12-27: 325 mg via ORAL
  Filled 2016-12-27: qty 1

## 2016-12-27 MED ORDER — FOLIC ACID 1 MG PO TABS
1.0000 mg | ORAL_TABLET | Freq: Every day | ORAL | Status: DC
  Administered 2016-12-27: 1 mg via ORAL
  Filled 2016-12-27: qty 1

## 2016-12-27 MED ORDER — SPIRONOLACTONE 25 MG PO TABS: 12.5000 mg | ORAL_TABLET | Freq: Every day | ORAL | 1 refills | Status: DC

## 2016-12-27 MED ORDER — POTASSIUM CHLORIDE CRYS ER 20 MEQ PO TBCR: 20.0000 meq | EXTENDED_RELEASE_TABLET | Freq: Two times a day (BID) | ORAL | 1 refills | Status: DC

## 2016-12-27 MED ORDER — MOMETASONE FURO-FORMOTEROL FUM 100-5 MCG/ACT IN AERO: 2.0000 | INHALATION_SPRAY | Freq: Two times a day (BID) | RESPIRATORY_TRACT | 1 refills | Status: DC

## 2016-12-27 MED ORDER — LOSARTAN POTASSIUM 25 MG PO TABS: 12.5000 mg | ORAL_TABLET | Freq: Every day | ORAL | 1 refills | Status: DC

## 2016-12-27 MED ORDER — OXYCODONE HCL 5 MG PO TABS: 5.0000 mg | ORAL_TABLET | Freq: Four times a day (QID) | ORAL | 0 refills | Status: DC | PRN

## 2016-12-27 MED ORDER — TORSEMIDE 20 MG PO TABS: 40.0000 mg | ORAL_TABLET | Freq: Two times a day (BID) | ORAL | 1 refills | Status: DC

## 2016-12-27 MED ORDER — TORSEMIDE 20 MG PO TABS
40.0000 mg | ORAL_TABLET | Freq: Two times a day (BID) | ORAL | Status: DC
  Administered 2016-12-27: 40 mg via ORAL
  Filled 2016-12-27: qty 2

## 2016-12-27 MED ORDER — CLOPIDOGREL BISULFATE 75 MG PO TABS: 75.0000 mg | ORAL_TABLET | Freq: Every day | ORAL | 1 refills | Status: DC

## 2016-12-27 MED ORDER — ATORVASTATIN CALCIUM 40 MG PO TABS: 40.0000 mg | ORAL_TABLET | Freq: Every day | ORAL | 1 refills | Status: DC

## 2016-12-27 NOTE — Progress Notes (Signed)
[name] to be D/C'd Home per MD order. Discussed with the patient and all questions fully answered.    VVS, Skin clean, dry and intact without evidence of skin break down, no evidence of skin tears noted.  IV catheter discontinued intact. Site without signs and symptoms of complications. Dressing and pressure applied.  An After Visit Summary was printed and given to the patient.  Patient escorted via [place], and D/C home via private auto.  Cyndra Numbers  12/27/2016 4:51 PM

## 2016-12-27 NOTE — Progress Notes (Signed)
Patient ID: Emily Rocha, female   DOB: 04-09-1955, 61 y.o.   MRN: [id number]      Advanced Heart Failure Rounding Note  PCP: Dr. Woody Seller Primary Cardiologist: New (Dr. Percival Spanish) HF: New (Dr. Haroldine Laws)  Subjective:    Developed Afib overnight 12/21/16. Converted with IV amio bolus.  Massive diuresis noted. IV lasix stopped with hypotension and started on Neosynephrine at 30.   Yesterday continue to diurese with IV lasix.  Wants to go home. Denies SOB. Ambulated 120 feet.    Echo 12/20/16 LVEF 45-50%, Mild/Mod AS  Objective:   Weight Range: 188 lb 14.4 oz (85.7 kg) Body mass index is 32.42 kg/m.   Vital Signs:   Temp:  [97.7 F (36.5 C)-99.2 F (37.3 C)] 99.2 F (37.3 C) (11/12 0001) Pulse Rate:  [82-86] 86 (11/12 0001) Resp:  [19-34] 34 (11/12 0448) BP: (102-103)/(59-74) 103/59 (11/12 0001) SpO2:  [94 %-98 %] 95 % (11/12 0822) Weight:  [188 lb 14.4 oz (85.7 kg)] 188 lb 14.4 oz (85.7 kg) (11/12 0448) Last BM Date: 12/26/16  Weight change: Filed Weights   12/25/16 0249 12/26/16 0457 12/27/16 0448  Weight: 191 lb 14.4 oz (87 kg) 182 lb (82.6 kg) 188 lb 14.4 oz (85.7 kg)   Intake/Output:   Intake/Output Summary (Last 24 hours) at 12/27/2016 1045 Last data filed at 12/27/2016 0741 Gross per 24 hour  Intake 618 ml  Output 1400 ml  Net -782 ml    Physical Exam    General:  Well appearing. No resp difficulty. Sitting in the chair HEENT: normal Neck: supple. no JVD. Carotids 2+ bilat; no bruits. No lymphadenopathy or thryomegaly appreciated. Cor: PMI nondisplaced. Regular rate & rhythm. No rubs, gallops. 2/6 SEM RUSB. Sternal incision approximated. Ecchymotic superior aspect.  Lungs: clear on 2 liters oxygen.  Abdomen: soft, nontender, nondistended. No hepatosplenomegaly. No bruits or masses. Good bowel sounds. Extremities: no cyanosis, clubbing, rash, edema Neuro: alert & orientedx3, cranial nerves grossly intact. moves all 4 extremities w/o difficulty. Affect  pleasant   Telemetry  NSR 80s personally reviewed.   EKG    12/15/16 NSR 69 bpm  Labs    CBC Recent Labs    12/26/16 0500 12/27/16 0430  WBC 12.3* 9.5  HGB 9.2* 9.2*  HCT 30.2* 29.8*  MCV 89.9 89.8  PLT 390 829*   Basic Metabolic Panel Recent Labs    12/25/16 0441 12/26/16 0500 12/27/16 0430  NA 130* 130* 130*  K 3.4* 3.6 3.7  CL 77* 76* 78*  CO2 44* 45* 42*  GLUCOSE 103* 116* 100*  BUN 5* 7 8  CREATININE 0.56 0.63 0.50  CALCIUM 8.4* 8.5* 8.7*  MG 2.1  --   --    Liver Function Tests No results for input(s): AST, ALT, ALKPHOS, BILITOT, PROT, ALBUMIN in the last 72 hours. No results for input(s): LIPASE, AMYLASE in the last 72 hours. Cardiac Enzymes No results for input(s): CKTOTAL, CKMB, CKMBINDEX, TROPONINI in the last 72 hours.  BNP: BNP (last 3 results) Recent Labs    12/12/16 1857  BNP 458.0*    ProBNP (last 3 results) No results for input(s): PROBNP in the last 8760 hours.   D-Dimer No results for input(s): DDIMER in the last 72 hours. Hemoglobin A1C No results for input(s): HGBA1C in the last 72 hours. Fasting Lipid Panel No results for input(s): CHOL, HDL, LDLCALC, TRIG, CHOLHDL, LDLDIRECT in the last 72 hours. Thyroid Function Tests No results for input(s): TSH, T4TOTAL, T3FREE, THYROIDAB in the last  72 hours.  Invalid input(s): FREET3  Other results:   Imaging   No results found.  Medications:     Scheduled Medications: . amiodarone  200 mg Oral BID  . aspirin EC  81 mg Oral Daily  . atorvastatin  40 mg Oral q1800  . bisacodyl  10 mg Oral Daily   Or  . bisacodyl  10 mg Rectal Daily  . Chlorhexidine Gluconate Cloth  6 each Topical Q0600  . clopidogrel  75 mg Oral Daily  . docusate sodium  200 mg Oral Daily  . enoxaparin (LOVENOX) injection  40 mg Subcutaneous Q24H  . ferrous [id number] C-folic acid  1 capsule Oral TID PC  . furosemide  40 mg Intravenous BID  . hydrocortisone   Rectal BID  . ipratropium  0.5  mg Nebulization TID  . levalbuterol  1.25 mg Nebulization TID  . losartan  12.5 mg Oral Daily  . mometasone-formoterol  2 puff Inhalation BID  . pantoprazole  40 mg Oral Daily  . potassium chloride  40 mEq Oral BID  . sodium chloride flush  10-40 mL Intracatheter Q12H  . sodium chloride flush  3 mL Intravenous Q12H  . spironolactone  12.5 mg Oral QHS    Infusions: . lactated ringers Stopped (12/19/16 0918)  . lactated ringers Stopped (12/23/16 1128)    PRN Medications: albuterol, ALPRAZolam, hydrocortisone, Influenza vac split quadrivalent PF, magnesium hydroxide, metoprolol tartrate, ondansetron (ZOFRAN) IV, oxyCODONE, sodium chloride flush, sodium chloride flush, traMADol    Patient Profile   Emily Rocha is a 61 y.o. female with h/o Asthma, COPD, tobacco abuse, HLD, PAD, carotid artery stenosis s/p R CEA, and GERD who presented to [facility] 12/12/16 with worsening dyspnea and CP.  Presented with Chest pain -> NSTEMI with no prior known cardiac disease. [place] 12/14/16 with severe disease. Taken for emergent CABG the same day.  CHF team consulted with difficulty weaning off milrinone and for GDMT.   Assessment/Plan   1. NSTEMI s/p emergent CABG 12/14/16: Stable on atorvastatin, ASA and Plavix.   2. Acute on chronic systolic CHF due to ischemic cardiomyopathy: Echo 12/13/16 EF 45-50%.  Per Dr. Haroldine Laws Intra-Op TEE appears to have EF 25-30% with Mod/Sev MR.  Volume status ok. Stop IV lasix. Start torsemide 40 mg twice a day.  - Continue 12.5 mg spiro daily.    - Continue losartan 12.5 mg daily 3. Hypotension: Resolved.   4. COPD with chronic hypoxic respiratory failure - Continue home meds and O2. No change.   5. Post op Afib: Converted with IV amio -> po amio.  Remains in NSR.  -  No anticoagulation for now. Anticipate stopping amio after 4 weeks.  6. Hypokalemia: K 3.7 .  7. Hyponatremia: Mild.  -Sodium 130.  Home today  HF meds Torsemide 40 mg twice a day.  Spiro 12.5 mg  daily Losartan 12.5 mg daily  Amio 200 mg twice a day  Plan to start bisoprolol as an outpatient once amio has been weaned.   HF follow up set up. 11/20 at 2:30  Amy Clegg NP-C  12/27/2016  Patient seen and examined with Darrick Grinder, NP. We discussed all aspects of the encounter. I agree with the assessment and plan as stated above.   IV diuresis restarted over the weekend. Volume status now improved. Very eager to go home. Can switch back to po torsemide today (40 bid) and discharge home. We will follow closely in HF Clinic. D/C meds discussed with PharmD on rounds.  Glori Bickers, MD  11:15 AM

## 2016-12-27 NOTE — Progress Notes (Signed)
Physical Therapy Treatment Patient Details Name: Emily Rocha MRN: [id number] DOB: 1955-10-11 Today's Date: 12/27/2016    History of Present Illness 61 y.o. female admitted with non-ST elevation myocardial infarction. Cardiac catheterization revealed severe coronary disease including distal left main. She was taken emergently for cardiac catheterization. Echocardiogram preoperatively showed ejection fraction 45-50%, mild aortic stenosis, mild to moderate mitral regurgitation and mild left atrial enlargement.  CABG x 3 on 12/15/16.  Pt with  hx of carotid artery stenosis s/p right CEA (12/06/14), former smoker, COPD, GERD, HLD, and PAD.     PT Comments    Pt progressing well. Ambulated without AD 200 feet. RW for home remains appropriate for community ambulation. PT to continue per POC.   Follow Up Recommendations  Home health PT;Supervision/Assistance - 24 hour     Equipment Recommendations  Rolling walker with 5" wheels;3in1 (PT)    Recommendations for Other Services       Precautions / Restrictions Precautions Precautions: Sternal;Fall Precaution Comments: Pt independently recalled sternal precautions.    Mobility  Bed Mobility               General bed mobility comments: Pt in recliner upon arrival.  Transfers   Equipment used: None   Sit to Stand: Min guard Stand pivot transfers: Min guard       General transfer comment: Pt able to maintain sternal precautions.  Ambulation/Gait Ambulation/Gait assistance: Supervision Ambulation Distance (Feet): 200 Feet Assistive device: None Gait Pattern/deviations: Step-through pattern;Decreased stride length Gait velocity: decreased   General Gait Details: Pt ambulated on 2 L O2 with desat to 85%. Standing resting break with pursed lip breathing x 30 seconds for recovery to 90%.   Stairs            Wheelchair Mobility    Modified Rankin (Stroke Patients Only)       Balance                                             Cognition Arousal/Alertness: Awake/alert Behavior During Therapy: WFL for tasks assessed/performed Overall Cognitive Status: Within Functional Limits for tasks assessed                                        Exercises      General Comments        Pertinent Vitals/Pain Pain Assessment: No/denies pain    Home Living                      Prior Function            PT Goals (current goals can now be found in the care plan section) Acute Rehab PT Goals Patient Stated Goal: home PT Goal Formulation: With patient Time For Goal Achievement: 01/01/17 Potential to Achieve Goals: Good Progress towards PT goals: Progressing toward goals    Frequency    Min 3X/week      PT Plan Current plan remains appropriate    Co-evaluation              AM-PAC PT "6 Clicks" Daily Activity  Outcome Measure  Difficulty turning over in bed (including adjusting bedclothes, sheets and blankets)?: A Little Difficulty moving from lying on back to sitting on the side of the bed? :  A Lot Difficulty sitting down on and standing up from a chair with arms (e.g., wheelchair, bedside commode, etc,.)?: A Little Help needed moving to and from a bed to chair (including a wheelchair)?: A Little Help needed walking in hospital room?: A Little Help needed climbing 3-5 steps with a railing? : A Little 6 Click Score: 17    End of Session Equipment Utilized During Treatment: Gait belt;Oxygen Activity Tolerance: Patient tolerated treatment well Patient left: in chair;with call bell/phone within reach Nurse Communication: Mobility status PT Visit Diagnosis: Unsteadiness on feet (R26.81);Muscle weakness (generalized) (M62.81);Pain     Time: 1030-1041 PT Time Calculation (min) (ACUTE ONLY): 11 min  Charges:  $Gait Training: 8-22 mins                    G Codes:       Emily Rocha, PT  Office # [phone] Pager [phone]    Emily Rocha 12/27/2016, 10:50 AM

## 2016-12-27 NOTE — Progress Notes (Signed)
CARDIAC REHAB PHASE I   PRE:  Rate/Rhythm: 86 SR  BP:  Sitting: 110/61        SaO2: 93 2L  MODE:  Ambulation: 120 ft   POST:  Rate/Rhythm: 98 SR  BP:  Sitting: 107/60         SaO2: 88 2L  Pt ambulated 120 ft on 2L O2, hand held assist, steady gait, tolerated fairly well. Pt c/o some DOE, encouraged pursed lip breathing, standing rest x1. Pt states she has no questions regarding education at this time, reinforced sternal precautions. Pt to recliner after walk, feet elevated, call bell within reach. Will follow if pt does not discharge today.    [place], RN, BSN 12/27/2016 9:51 AM

## 2016-12-27 NOTE — Progress Notes (Addendum)
      [address]       [place] [address]             [phone]      13 Days Post-Op Procedure(s) (LRB): CORONARY ARTERY BYPASS GRAFTING (CABG) x three , using left internal mammary artery and right leg greater saphenous vein harvested endoscopically (N/A) TRANSESOPHAGEAL ECHOCARDIOGRAM (TEE) (N/A) Subjective: Feels okay today. Still on oxygen and having trouble with her incentive spirometer. Feels okay overall.   Objective: Vital signs in last 24 hours: Temp:  [97.7 F (36.5 C)-99.2 F (37.3 C)] 99.2 F (37.3 C) (11/12 0001) Pulse Rate:  [82-86] 86 (11/12 0001) Cardiac Rhythm: Sinus tachycardia (11/12 0701) Resp:  [16-34] 34 (11/12 0448) BP: (92-103)/(53-74) 103/59 (11/12 0001) SpO2:  [94 %-98 %] 95 % (11/12 0822) Weight:  [188 lb 14.4 oz (85.7 kg)] 188 lb 14.4 oz (85.7 kg) (11/12 0448)     Intake/Output from previous day: 11/11 0701 - 11/12 0700 In: 598 [P.O.:598] Out: 1400 [Urine:1400] Intake/Output this shift: Total I/O In: 20 [I.V.:20] Out: -   General appearance: alert, cooperative and no distress Heart: regular rate and rhythm, S1, S2 normal, no murmur, click, rub or gallop Lungs: clear to auscultation bilaterally Abdomen: soft, non-tender; bowel sounds normal; no masses,  no organomegaly Extremities: extremities normal, atraumatic, no cyanosis or edema Wound: clean and dry  Lab Results: Recent Labs    12/26/16 0500 12/27/16 0430  WBC 12.3* 9.5  HGB 9.2* 9.2*  HCT 30.2* 29.8*  PLT 390 406*   BMET:  Recent Labs    12/26/16 0500 12/27/16 0430  NA 130* 130*  K 3.6 3.7  CL 76* 78*  CO2 45* 42*  GLUCOSE 116* 100*  BUN 7 8  CREATININE 0.63 0.50  CALCIUM 8.5* 8.7*    PT/INR: No results for input(s): LABPROT, INR in the last 72 hours. ABG    Component Value Date/Time   PHART 7.421 12/16/2016 0306   HCO3 26.7 12/16/2016 0306   TCO2 37 (H) 12/22/2016 1514   O2SAT 99.7 12/27/2016 0449   CBG (last 3)  No results for  input(s): GLUCAP in the last 72 hours.  Assessment/Plan: S/P Procedure(s) (LRB): CORONARY ARTERY BYPASS GRAFTING (CABG) x three , using left internal mammary artery and right leg greater saphenous vein harvested endoscopically (N/A) TRANSESOPHAGEAL ECHOCARDIOGRAM (TEE) (N/A)  1. CV- NSR, BP remains liable. Tolerating Amio 200mg  BID, Lipitor,  Cozaar, ASA, Plavix.  2. Pulm-severe COPD/Asthma, weaning oxygen as tolerated, currently on 2L [place]. Will likely require home oxygen 3. Renal-creatinine stable, weight increased by 6 lbs today question accuracy. HF following. Remains on IV lasix 4. Hypokalemia-resolved  5. H and H stable  Plan: Possible discharge today. AHF assisting with care.     LOS: 15 days    Elgie Collard 12/27/2016

## 2016-12-27 NOTE — Progress Notes (Signed)
Patient ID: Emily Rocha, female   DOB: Jan 06, 1956, 61 y.o.   MRN: [id number]      Advanced Heart Failure Rounding Note  PCP: Dr. Woody Seller Primary Cardiologist: New (Dr. Percival Spanish) HF: New (Dr. Haroldine Laws)  Subjective:    Developed Afib overnight 12/21/16. Converted with IV amio bolus.  Massive diuresis noted. IV lasix stopped with hypotension and started on Neosynephrine at 30.   Off Neo and off milrinone. Yesterday, she was much more short of breath and volume overloaded.  She was restarted on IV Lasix.    She is breathing much better today.  Unfortunately, her I/Os are not accurate and I'm not sure that weight is correct (suggests she is down 9 lbs).  However, think she did diurese vigorously. SBP 90s-100s.  Co-ox is not accurate.   Echo 12/20/16 LVEF 45-50%, Mild/Mod AS  Objective:   Weight Range: 188 lb 14.4 oz (85.7 kg) Body mass index is 32.42 kg/m.   Vital Signs:   Temp:  [97.7 F (36.5 C)-99.2 F (37.3 C)] 99.2 F (37.3 C) (11/12 0001) Pulse Rate:  [82-86] 86 (11/12 0001) Resp:  [16-34] 34 (11/12 0448) BP: (92-103)/(53-74) 103/59 (11/12 0001) SpO2:  [94 %-98 %] 95 % (11/12 0822) Weight:  [188 lb 14.4 oz (85.7 kg)] 188 lb 14.4 oz (85.7 kg) (11/12 0448) Last BM Date: 12/26/16  Weight change: Filed Weights   12/25/16 0249 12/26/16 0457 12/27/16 0448  Weight: 191 lb 14.4 oz (87 kg) 182 lb (82.6 kg) 188 lb 14.4 oz (85.7 kg)   Intake/Output:   Intake/Output Summary (Last 24 hours) at 12/27/2016 0912 Last data filed at 12/27/2016 0741 Gross per 24 hour  Intake 618 ml  Output 1400 ml  Net -782 ml    Physical Exam    Affect appropriate Elderly female  HEENT: normal Neck supple with no adenopathy JVP normal no bruits no thyromegaly Lungs clear with no wheezing and good diaphragmatic motion Heart:  S1/S2 MR murmur, no rub, gallop or click PMI normal Post sternotomy  Abdomen: benighn, BS positve, no tenderness, no AAA no bruit.  No HSM or HJR Distal pulses intact  with no bruits No edema Neuro non-focal Skin warm and dry No muscular weakness    Telemetry   NSR 80s, personally reviewed.   EKG    12/15/16 NSR 69 bpm  Labs    CBC Recent Labs    12/26/16 0500 12/27/16 0430  WBC 12.3* 9.5  HGB 9.2* 9.2*  HCT 30.2* 29.8*  MCV 89.9 89.8  PLT 390 932*   Basic Metabolic Panel Recent Labs    12/25/16 0441 12/26/16 0500 12/27/16 0430  NA 130* 130* 130*  K 3.4* 3.6 3.7  CL 77* 76* 78*  CO2 44* 45* 42*  GLUCOSE 103* 116* 100*  BUN 5* 7 8  CREATININE 0.56 0.63 0.50  CALCIUM 8.4* 8.5* 8.7*  MG 2.1  --   --    Liver Function Tests No results for input(s): AST, ALT, ALKPHOS, BILITOT, PROT, ALBUMIN in the last 72 hours. No results for input(s): LIPASE, AMYLASE in the last 72 hours. Cardiac Enzymes No results for input(s): CKTOTAL, CKMB, CKMBINDEX, TROPONINI in the last 72 hours.  BNP: BNP (last 3 results) Recent Labs    12/12/16 1857  BNP 458.0*    ProBNP (last 3 results) No results for input(s): PROBNP in the last 8760 hours.   D-Dimer No results for input(s): DDIMER in the last 72 hours. Hemoglobin A1C No results for input(s): HGBA1C in  the last 72 hours. Fasting Lipid Panel No results for input(s): CHOL, HDL, LDLCALC, TRIG, CHOLHDL, LDLDIRECT in the last 72 hours. Thyroid Function Tests No results for input(s): TSH, T4TOTAL, T3FREE, THYROIDAB in the last 72 hours.  Invalid input(s): FREET3  Other results:   Imaging   No results found.  Medications:     Scheduled Medications: . amiodarone  200 mg Oral BID  . aspirin EC  81 mg Oral Daily  . atorvastatin  40 mg Oral q1800  . bisacodyl  10 mg Oral Daily   Or  . bisacodyl  10 mg Rectal Daily  . Chlorhexidine Gluconate Cloth  6 each Topical Q0600  . clopidogrel  75 mg Oral Daily  . docusate sodium  200 mg Oral Daily  . enoxaparin (LOVENOX) injection  40 mg Subcutaneous Q24H  . ferrous [id number] C-folic acid  1 capsule Oral TID PC  .  furosemide  40 mg Intravenous BID  . hydrocortisone   Rectal BID  . ipratropium  0.5 mg Nebulization TID  . levalbuterol  1.25 mg Nebulization TID  . losartan  12.5 mg Oral Daily  . mometasone-formoterol  2 puff Inhalation BID  . pantoprazole  40 mg Oral Daily  . potassium chloride  40 mEq Oral BID  . sodium chloride flush  10-40 mL Intracatheter Q12H  . sodium chloride flush  3 mL Intravenous Q12H  . spironolactone  12.5 mg Oral QHS    Infusions: . lactated ringers Stopped (12/19/16 0918)  . lactated ringers Stopped (12/23/16 1128)    PRN Medications: albuterol, ALPRAZolam, hydrocortisone, Influenza vac split quadrivalent PF, magnesium hydroxide, metoprolol tartrate, ondansetron (ZOFRAN) IV, oxyCODONE, sodium chloride flush, sodium chloride flush, traMADol    Patient Profile   Emily Rocha is a 61 y.o. female with h/o Asthma, COPD, tobacco abuse, HLD, PAD, carotid artery stenosis s/p R CEA, and GERD who presented to [facility] 12/12/16 with worsening dyspnea and CP.  Presented with Chest pain -> NSTEMI with no prior known cardiac disease. [place] 12/14/16 with severe disease. Taken for emergent CABG the same day.  CHF team consulted with difficulty weaning off milrinone and for GDMT.   Assessment/Plan   1. NSTEMI s/p emergent CABG 12/14/16: Stable on atorvastatin, ASA and Plavix.   2. Acute on chronic systolic CHF due to ischemic cardiomyopathy: Echo 12/13/16 EF 45-50%.  Per Dr. Haroldine Laws Intra-Op TEE appears to have EF 25-30% with Mod/Sev MR. Breathing improved lungs clearing   -  Lasix to 40 mg IV bid   - Continue spironolactone 12.5 daily.  - Continue losartan 12.5 mg daily, no BP room to titrate.  3. COPD with chronic hypoxic respiratory failure - Continue home meds and O2. No change.   5. Post op Afib: Converted with IV amio -> po amio.  Remains in NSR.  - Continue po amio.   - No anticoagulation for now.    Jenkins Rouge 12/27/2016

## 2016-12-31 LAB — ECHO TEE
AV Mean grad: 11 mmHg
Ao-asc: 2.1 cm
LVOT diameter: 19 mm
Mean grad: 2 mmHg
Sinus: 2.8 cm

## 2016-12-31 NOTE — Addendum Note (Signed)
Addendum  created 12/31/16 2237 by Oleta Mouse, MD   Diagnosis association updated

## 2017-01-04 ENCOUNTER — Ambulatory Visit (HOSPITAL_COMMUNITY)
Admission: RE | Admit: 2017-01-04 | Discharge: 2017-01-04 | Disposition: A | Discharge status: Home / Self Care | Payer: BLUE CROSS/BLUE SHIELD | Source: Ambulatory Visit | Attending: Internal Medicine | Admitting: Internal Medicine

## 2017-01-04 ENCOUNTER — Encounter (HOSPITAL_COMMUNITY): Payer: Self-pay

## 2017-01-04 ENCOUNTER — Ambulatory Visit: Payer: BLUE CROSS/BLUE SHIELD | Admitting: Physician Assistant

## 2017-01-04 VITALS — BP 100/64 | HR 101 | Wt 175.6 lb

## 2017-01-04 DIAGNOSIS — Z79899 Other long term (current) drug therapy: Secondary | ICD-10-CM | POA: Diagnosis not present

## 2017-01-04 DIAGNOSIS — K219 Gastro-esophageal reflux disease without esophagitis: Secondary | ICD-10-CM | POA: Insufficient documentation

## 2017-01-04 DIAGNOSIS — I5032 Chronic diastolic (congestive) heart failure: Secondary | ICD-10-CM

## 2017-01-04 DIAGNOSIS — I480 Paroxysmal atrial fibrillation: Secondary | ICD-10-CM | POA: Diagnosis not present

## 2017-01-04 DIAGNOSIS — Z9981 Dependence on supplemental oxygen: Secondary | ICD-10-CM | POA: Diagnosis not present

## 2017-01-04 DIAGNOSIS — J449 Chronic obstructive pulmonary disease, unspecified: Secondary | ICD-10-CM | POA: Diagnosis not present

## 2017-01-04 DIAGNOSIS — I2510 Atherosclerotic heart disease of native coronary artery without angina pectoris: Secondary | ICD-10-CM | POA: Diagnosis not present

## 2017-01-04 DIAGNOSIS — Z87891 Personal history of nicotine dependence: Secondary | ICD-10-CM | POA: Insufficient documentation

## 2017-01-04 DIAGNOSIS — J9611 Chronic respiratory failure with hypoxia: Secondary | ICD-10-CM | POA: Insufficient documentation

## 2017-01-04 DIAGNOSIS — Z951 Presence of aortocoronary bypass graft: Secondary | ICD-10-CM | POA: Diagnosis not present

## 2017-01-04 DIAGNOSIS — I6529 Occlusion and stenosis of unspecified carotid artery: Secondary | ICD-10-CM | POA: Insufficient documentation

## 2017-01-04 DIAGNOSIS — Z7982 Long term (current) use of aspirin: Secondary | ICD-10-CM | POA: Insufficient documentation

## 2017-01-04 DIAGNOSIS — I5022 Chronic systolic (congestive) heart failure: Secondary | ICD-10-CM | POA: Diagnosis not present

## 2017-01-04 LAB — BASIC METABOLIC PANEL
ANION GAP: 10 (ref 5–15)
BUN: 10 mg/dL (ref 6–20)
CO2: 31 mmol/L (ref 22–32)
CREATININE: 0.68 mg/dL (ref 0.44–1.00)
Calcium: 9 mg/dL (ref 8.9–10.3)
Chloride: 93 mmol/L — ABNORMAL LOW (ref 101–111)
GFR calc non Af Amer: >60 mL/min (ref >60)
Glucose, Bld: 98 mg/dL (ref 65–99)
Potassium: 4.2 mmol/L (ref 3.5–5.1)
SODIUM: 134 mmol/L — AB (ref 135–145)

## 2017-01-04 MED ORDER — BISOPROLOL FUMARATE 5 MG PO TABS: 2.5000 mg | ORAL_TABLET | Freq: Every day | ORAL | 11 refills | Status: DC

## 2017-01-04 MED ORDER — AMIODARONE HCL 200 MG PO TABS: 200.0000 mg | ORAL_TABLET | Freq: Every day | ORAL | 1 refills | Status: DC

## 2017-01-04 NOTE — Progress Notes (Signed)
Advanced Heart Failure Medication Review by a Pharmacist  Does the patient  feel that his/her medications are working for him/her?  yes  Has the patient been experiencing any side effects to the medications prescribed?  no  Does the patient measure his/her own blood pressure or blood glucose at home?  Not asked at this visit    Does the patient have any problems obtaining medications due to transportation or finances?   no  Understanding of regimen: good Understanding of indications: good Potential of compliance: good Patient understands to avoid NSAIDs. Patient understands to avoid decongestants.  Issues to address at subsequent visits: none    Pharmacist comments: Mrs Jahn presents to clinic today with her husband and no medication list or bottles. Her husband helps her manage her medications. She reports adherence to her current regimen and they do not have any medication related questions or concerns at this time.    Time with patient: 10 Preparation and documentation time: 2 Total time: 12

## 2017-01-04 NOTE — Patient Instructions (Signed)
START Bisoprolol 2.5 mg (one half tab) daily at bedtime DECREASE Amiodarone to 200 mg ,one tab daily  Labs today We will only contact you if something comes back abnormal or we need to make some changes. Otherwise no news is good news!  Your physician recommends that you schedule a follow-up appointment in: 6 weeks with Dr Haroldine Laws  Do the following things EVERYDAY: 1) Weigh yourself in the morning before breakfast. Write it down and keep it in a log. 2) Take your medicines as prescribed 3) Eat low salt foods-Limit salt (sodium) to 2000 mg per day.  4) Stay as active as you can everyday 5) Limit all fluids for the day to less than 2 liters

## 2017-01-04 NOTE — Progress Notes (Signed)
PCP: Dr Woody Seller  Primary Cardiologist: Dr Percival Spanish HF MD: Dr Haroldine Laws   HPI: Emily Rocha is a 61 year old with a h/o COPD, asthma, PAD, carotid stenosis, COPD, GERD, CAD, CABGx 3, and PAF.   Admitted 10/28 through 12/26/2016. Presented with chest pain-->NSTEMI and underwent CABG x3 on 12/14/2016. Post operatively she developed A fib but converted to NSR with amiodarone. HF d/c meds --> Torsemide 40 mg twice a day, Spiro 12.5 mg daily, Losartan 12.5 mg daily, and amio 200 mg twice a day.  Today she returns for post hospital follow up. Overall feeling stronger every day. Denies CP. Mild dyspnea with exertion which she says is much better that before surgery. Weight at home trending down 181>176 pounds. Denies PND/Orthopnea. No fever or chills. No bleeding problems. Husband prepares medications. [facility].   Echo 12/20/16 LVEF 45-50%, Mild/Mod AS   ROS: All systems negative except as listed in HPI, PMH and Problem List.  SH:  Social History   Socioeconomic History  . Marital status: Divorced    Spouse name: Not on file  . Number of children: Not on file  . Years of education: Not on file  . Highest education level: Not on file  Social Needs  . Financial resource strain: Not on file  . Food insecurity - worry: Not on file  . Food insecurity - inability: Not on file  . Transportation needs - medical: Not on file  . Transportation needs - non-medical: Not on file  Occupational History  . Not on file  Tobacco Use  . Smoking status: Former Smoker    Packs/day: 2.00    Years: 40.00    Pack years: 80.00    Last attempt to quit: 10/03/2010    Years since quitting: 6.2  . Smokeless tobacco: Never Used  . Tobacco comment: No longer using the Vapor  Substance and Sexual Activity  . Alcohol use: No    Alcohol/week: 0.0 oz  . Drug use: No  . Sexual activity: Not on file  Other Topics Concern  . Not on file  Social History Narrative  . Not on file    FH:  Family History    Problem Relation Age of Onset  . Heart attack Mother   . Cancer Mother   . Diabetes Mother   . Heart disease Mother   . Heart attack Father   . Heart disease Father     Past Medical History:  Diagnosis Date  . Asthma   . COPD (chronic obstructive pulmonary disease) ([place])   . GERD (gastroesophageal reflux disease)   . Shortness of breath dyspnea     Current Outpatient Medications  Medication Sig Dispense Refill  . ALPRAZolam (XANAX) 0.5 MG tablet Take 0.5 mg by mouth 2 (two) times daily as needed for anxiety.     Marland Kitchen amiodarone (PACERONE) 200 MG tablet Take 1 tablet (200 mg total) by mouth daily. 30 tablet 1  . aspirin EC 81 MG tablet Take 81 mg by mouth at bedtime.     Marland Kitchen atorvastatin (LIPITOR) 40 MG tablet Take 1 tablet (40 mg total) daily by mouth. 30 tablet 1  . Cholecalciferol (VITAMIN D) 2000 units CAPS Take 2,000 Units by mouth daily.    . clopidogrel (PLAVIX) 75 MG tablet Take 1 tablet (75 mg total) daily by mouth. 30 tablet 1  . esomeprazole (NEXIUM) 40 MG capsule Take 1 capsule (40 mg total) by mouth 2 (two) times daily before a meal. 60 capsule  2  . ferrous sulfate 325 (65 FE) MG tablet Take 1 tablet (325 mg total) 2 (two) times daily with a meal by mouth. 60 tablet 3  . folic acid (FOLVITE) 1 MG tablet Take 1 tablet (1 mg total) daily by mouth. 30 tablet 1  . losartan (COZAAR) 25 MG tablet Take 0.5 tablets (12.5 mg total) daily by mouth. 15 tablet 1  . mometasone-formoterol (DULERA) 100-5 MCG/ACT AERO Inhale 2 puffs 2 (two) times daily into the lungs. 1 Inhaler 1  . Multiple Vitamin (MULTIVITAMIN WITH MINERALS) TABS tablet Take 1 tablet by mouth daily.    Marland Kitchen oxyCODONE (OXY IR/ROXICODONE) 5 MG immediate release tablet Take 1-2 tablets (5-10 mg total) every 6 (six) hours as needed by mouth for severe pain. 30 tablet 0  . potassium chloride SA (K-DUR,KLOR-CON) 20 MEQ tablet Take 1 tablet (20 mEq total) 2 (two) times daily by mouth. Do not take if torsemide is discontinued 60  tablet 1  . spironolactone (ALDACTONE) 25 MG tablet Take 0.5 tablets (12.5 mg total) at bedtime by mouth. 30 tablet 1  . torsemide (DEMADEX) 20 MG tablet Take 2 tablets (40 mg total) 2 (two) times daily by mouth. 120 tablet 1  . VENTOLIN HFA 108 (90 BASE) MCG/ACT inhaler Inhale 2 puffs into the lungs every 6 (six) hours as needed for shortness of breath.   0  . bisoprolol (ZEBETA) 5 MG tablet Take 0.5 tablets (2.5 mg total) by mouth at bedtime. 15 tablet 11   No current facility-administered medications for this encounter.     Vitals:   01/04/17 1444  BP: 100/64  Pulse: (!) 101  SpO2: 90%  Weight: 175 lb 9.6 oz (79.7 kg)    PHYSICAL EXAM: General:  Well appearing. No resp difficulty. Arrived in a wheel chair.  HEENT: normal. R CEA scar Neck: supple. JVP flat. Carotids 2+ bilaterally; no bruits. No lymphadenopathy or thryomegaly appreciated. Cor: PMI normal. Regular rate & rhythm. No rubs, gallops or murmurs. Sternal scar.  Lungs: clear Abdomen: soft, nontender, nondistended. No hepatosplenomegaly. No bruits or masses. Good bowel sounds. Extremities: no cyanosis, clubbing, rash, edema Neuro: alert & orientedx3, cranial nerves grossly intact. Moves all 4 extremities w/o difficulty. Affect pleasant.   ECG: Sinus Tach 101 BPM    ASSESSMENT & PLAN:  1. CAD S/P CABG 12/14/2016  On asa, statin.  -Eventually will need cardiac rehab once she finishes HH.  2. Chronic Systolic Heart Failure 85/05/6268 ECHO 45-50%  NYHA IIIb. Volume status stable. Continue torsemide 40 mg twice a day.  -Add 2.5 mg bisoprolol daily.  -Continue spiro 12.5 mg daily.  - BMET today.  Reinforced daily weights and low salt food choices.  3. PAF Maintaining NSR. Cut back amio to 200 mg daily Anticipate stopping amio next visit. No anticoagulant for now.   4. PAD On statin and plavix.  5. COPD - Chronic Hypoxic  Respiratory Failure  On 2 liters oxygen sats stable.    Greater than 50% of the (total  minutes 25) visit spent in counseling/coordination of care regarding heart failure and medications.    Follow up in 6 weeks.  Emily Couse  NP-C  3:57 PM

## 2017-01-12 ENCOUNTER — Telehealth (HOSPITAL_COMMUNITY): Payer: Self-pay | Admitting: *Deleted

## 2017-01-12 NOTE — Telephone Encounter (Signed)
Physical Therapist with [facility] called to report patient c/o dizziness since starting bisoprolol. Per Jonni Sanger pt can d/c bisoprolol but continue to monitor her bp and heart rate. Pt aware and agreeable.

## 2017-01-13 NOTE — Addendum Note (Signed)
Addended by: Lianne Cure A on: 01/13/2017 10:17 AM   Modules accepted: Orders

## 2017-01-18 ENCOUNTER — Other Ambulatory Visit: Payer: Self-pay | Admitting: Cardiothoracic Surgery

## 2017-01-18 DIAGNOSIS — Z951 Presence of aortocoronary bypass graft: Secondary | ICD-10-CM

## 2017-01-19 ENCOUNTER — Ambulatory Visit (INDEPENDENT_AMBULATORY_CARE_PROVIDER_SITE_OTHER): Payer: Self-pay | Admitting: Cardiothoracic Surgery

## 2017-01-19 ENCOUNTER — Encounter: Payer: Self-pay | Admitting: Cardiothoracic Surgery

## 2017-01-19 ENCOUNTER — Ambulatory Visit
Admission: RE | Admit: 2017-01-19 | Discharge: 2017-01-19 | Disposition: A | Discharge status: Home / Self Care | Payer: BLUE CROSS/BLUE SHIELD | Source: Ambulatory Visit | Attending: Cardiothoracic Surgery | Admitting: Cardiothoracic Surgery

## 2017-01-19 VITALS — BP 110/77 | HR 110 | Resp 20 | Ht 64.0 in | Wt 171.0 lb

## 2017-01-19 DIAGNOSIS — Z951 Presence of aortocoronary bypass graft: Secondary | ICD-10-CM

## 2017-01-19 NOTE — Progress Notes (Signed)
PCP is Glenda Chroman, MD Referring Provider is Burnell Blanks*  Chief Complaint  Patient presents with  . Routine Post Op    F/U from surgery with CXR s/p CABG x3 on 12/14/16    HPI: 1 month follow-up after CABG 3 for non-ST elevation MI Preop history of COPD Patient had transient atrial fibrillation postop now sinus tachycardia Patient has not been taking Zebeta and today is tachycardic 110 She has no wheezing. She has sinus tach. She will finish her current supply of amiodarone 200 mg daily then stop She will start taking metoprolol succinate 50 mg daily-Toprol-XL She is ready to start driving and attending outpatient cardiac rehabilitation  Chest x-ray performed today reveals clear lung fields, sternal wires intact and well aligned    Past Medical History:  Diagnosis Date  . Asthma   . COPD (chronic obstructive pulmonary disease) ([place])   . GERD (gastroesophageal reflux disease)   . Shortness of breath dyspnea     Past Surgical History:  Procedure Laterality Date  . CHOLECYSTECTOMY    . COLONOSCOPY N/A 10/03/2014   Procedure: COLONOSCOPY;  Surgeon: Rogene Houston, MD;  [facility];  Service: Endoscopy;  Laterality: N/A;  730  . CORONARY ARTERY BYPASS GRAFT N/A 12/14/2016   Procedure: CORONARY ARTERY BYPASS GRAFTING (CABG) x three , using left internal mammary artery and right leg greater saphenous vein harvested endoscopically;  Surgeon: Ivin Poot, MD;  Location: [place];  Service: Open Heart Surgery;  Laterality: N/A;  . ENDARTERECTOMY Right 12/06/2014   Procedure: ENDARTERECTOMY CAROTid;  Surgeon: Elam Dutch, MD;  Location: [facility] OR;  Service: Vascular;  Laterality: Right;  . ESOPHAGOGASTRODUODENOSCOPY N/A 12/03/2016   Procedure: ESOPHAGOGASTRODUODENOSCOPY (EGD);  Surgeon: Rogene Houston, MD;  [facility];  Service: Endoscopy;  Laterality: N/A;  7:30  . LEFT HEART CATH AND CORONARY ANGIOGRAPHY N/A 12/14/2016   Procedure: LEFT  HEART CATH AND CORONARY ANGIOGRAPHY;  Surgeon: Burnell Blanks, MD;  Location: [place] [facility];  Service: Cardiovascular;  Laterality: N/A;  . PERIPHERAL VASCULAR CATHETERIZATION N/A 12/05/2014   Procedure:  Carotid Arch Angiography;  Surgeon: Conrad Jensen Beach, MD;  Location: [place] [facility];  Service: Cardiovascular;  Laterality: N/A;  . TEE WITHOUT CARDIOVERSION N/A 12/14/2016   Procedure: TRANSESOPHAGEAL ECHOCARDIOGRAM (TEE);  Surgeon: Prescott Gum, Collier Salina, MD;  Location: [place];  Service: Open Heart Surgery;  Laterality: N/A;    Family History  Problem Relation Age of Onset  . Heart attack Mother   . Cancer Mother   . Diabetes Mother   . Heart disease Mother   . Heart attack Father   . Heart disease Father     Social History Social History   Tobacco Use  . Smoking status: Former Smoker    Packs/day: 2.00    Years: 40.00    Pack years: 80.00    Last attempt to quit: 10/03/2010    Years since quitting: 6.3  . Smokeless tobacco: Never Used  . Tobacco comment: No longer using the Vapor  Substance Use Topics  . Alcohol use: No    Alcohol/week: 0.0 oz  . Drug use: No    Current Outpatient Medications  Medication Sig Dispense Refill  . ALPRAZolam (XANAX) 0.5 MG tablet Take 0.5 mg by mouth 2 (two) times daily as needed for anxiety.     Marland Kitchen amiodarone (PACERONE) 200 MG tablet Take 1 tablet (200 mg total) by mouth daily. 30 tablet 1  . aspirin  EC 81 MG tablet Take 81 mg by mouth at bedtime.     Marland Kitchen atorvastatin (LIPITOR) 40 MG tablet Take 1 tablet (40 mg total) daily by mouth. 30 tablet 1  . bisoprolol (ZEBETA) 5 MG tablet Take 0.5 tablets (2.5 mg total) by mouth at bedtime. 15 tablet 11  . Cholecalciferol (VITAMIN D) 2000 units CAPS Take 2,000 Units by mouth daily.    . clopidogrel (PLAVIX) 75 MG tablet Take 1 tablet (75 mg total) daily by mouth. 30 tablet 1  . esomeprazole (NEXIUM) 40 MG capsule Take 1 capsule (40 mg total) by mouth 2 (two) times daily before a meal. 60  capsule 2  . ferrous sulfate 325 (65 FE) MG tablet Take 1 tablet (325 mg total) 2 (two) times daily with a meal by mouth. 60 tablet 3  . folic acid (FOLVITE) 1 MG tablet Take 1 tablet (1 mg total) daily by mouth. 30 tablet 1  . losartan (COZAAR) 25 MG tablet Take 0.5 tablets (12.5 mg total) daily by mouth. 15 tablet 1  . mometasone-formoterol (DULERA) 100-5 MCG/ACT AERO Inhale 2 puffs 2 (two) times daily into the lungs. 1 Inhaler 1  . Multiple Vitamin (MULTIVITAMIN WITH MINERALS) TABS tablet Take 1 tablet by mouth daily.    Marland Kitchen oxyCODONE (OXY IR/ROXICODONE) 5 MG immediate release tablet Take 1-2 tablets (5-10 mg total) every 6 (six) hours as needed by mouth for severe pain. 30 tablet 0  . potassium chloride SA (K-DUR,KLOR-CON) 20 MEQ tablet Take 1 tablet (20 mEq total) 2 (two) times daily by mouth. Do not take if torsemide is discontinued 60 tablet 1  . spironolactone (ALDACTONE) 25 MG tablet Take 0.5 tablets (12.5 mg total) at bedtime by mouth. 30 tablet 1  . torsemide (DEMADEX) 20 MG tablet Take 2 tablets (40 mg total) 2 (two) times daily by mouth. 120 tablet 1  . VENTOLIN HFA 108 (90 BASE) MCG/ACT inhaler Inhale 2 puffs into the lungs every 6 (six) hours as needed for shortness of breath.   0   No current facility-administered medications for this visit.     No Known Allergies  Review of Systems  Appetite and strength improved She is weaned off home oxygen No angina Incisions clean and dry No ankle edema  BP 110/77   Pulse (!) 110   Resp 20   Ht 5\' 4"  (1.626 m)   Wt 171 lb (77.6 kg)   SpO2 96% Comment: RA  BMI 29.35 kg/m  Physical Exam      Exam    General- alert and comfortable   Lungs- clear without rales, wheezes   Sternum-incision clean and dry stable   Cor- regular rate and rhythm, no murmur , gallop   Abdomen- soft, non-tender   Extremities - warm, non-tender, minimal edema   Neuro- oriented, appropriate, no focal weakness   Diagnostic Tests: Chest x-ray  clear  Impression: Excellent course 6 weeks postop CABG Plan: Patient needs to resume her beta blocker-we'll start Toprol-XL 50 mg daily Patient ready to start driving and appointment has been made for cardiac rehabilitation She understands she should not lift more than 10 pounds until her next visit.  Len Childs, MD [facility] [phone]

## 2017-01-21 ENCOUNTER — Telehealth: Payer: Self-pay

## 2017-01-21 NOTE — Telephone Encounter (Signed)
Patient called having questions about her medications.  Patient stated some medications were changed since her post-op appointment with Dr. Darcey Nora.  She stated that she was unsure as to whether to start or stop any medications.  Patient was instructed to continue all medications that were prescribed unless told otherwise by her physicians.  Patient also had concern about her inhalers, patient was instructed to call her PCP.  Patient acknowledged receipt.

## 2017-01-31 ENCOUNTER — Ambulatory Visit (INDEPENDENT_AMBULATORY_CARE_PROVIDER_SITE_OTHER): Payer: Self-pay

## 2017-01-31 ENCOUNTER — Encounter (HOSPITAL_COMMUNITY)
Admission: RE | Admit: 2017-01-31 | Discharge: 2017-01-31 | Disposition: A | Discharge status: Home / Self Care | Payer: BLUE CROSS/BLUE SHIELD | Source: Ambulatory Visit | Attending: Cardiology | Admitting: Cardiology

## 2017-01-31 VITALS — BP 110/70 | HR 85 | Ht 65.0 in | Wt 172.7 lb

## 2017-01-31 DIAGNOSIS — J449 Chronic obstructive pulmonary disease, unspecified: Secondary | ICD-10-CM | POA: Insufficient documentation

## 2017-01-31 DIAGNOSIS — Z79899 Other long term (current) drug therapy: Secondary | ICD-10-CM | POA: Insufficient documentation

## 2017-01-31 DIAGNOSIS — Z951 Presence of aortocoronary bypass graft: Secondary | ICD-10-CM

## 2017-01-31 DIAGNOSIS — K219 Gastro-esophageal reflux disease without esophagitis: Secondary | ICD-10-CM | POA: Diagnosis not present

## 2017-01-31 DIAGNOSIS — Z7951 Long term (current) use of inhaled steroids: Secondary | ICD-10-CM | POA: Diagnosis not present

## 2017-01-31 DIAGNOSIS — Z87891 Personal history of nicotine dependence: Secondary | ICD-10-CM | POA: Diagnosis not present

## 2017-01-31 DIAGNOSIS — Z7902 Long term (current) use of antithrombotics/antiplatelets: Secondary | ICD-10-CM | POA: Insufficient documentation

## 2017-01-31 DIAGNOSIS — Z4889 Encounter for other specified surgical aftercare: Secondary | ICD-10-CM

## 2017-01-31 NOTE — Progress Notes (Signed)
Cardiac Individual Treatment Plan  Patient Details  Name: Emily Rocha MRN: [id number] Date of Birth: 1955/12/04 Referring Provider:     [place] from 01/31/2017 in [facility]  Referring Provider  Dr. Percival Spanish      Initial Encounter Date:    CARDIAC REHAB PHASE II ORIENTATION from 01/31/2017 in [facility]  Date  01/31/17  Referring Provider  Dr. Percival Spanish      Visit Diagnosis: S/P CABG x 3  Patient's Home Medications on Admission:  Current Outpatient Medications:  .  ALPRAZolam (XANAX) 0.5 MG tablet, Take 0.5 mg by mouth 2 (two) times daily as needed for anxiety. , Disp: , Rfl:  .  amiodarone (PACERONE) 200 MG tablet, Take 1 tablet (200 mg total) by mouth daily., Disp: 30 tablet, Rfl: 1 .  atorvastatin (LIPITOR) 40 MG tablet, Take 1 tablet (40 mg total) daily by mouth., Disp: 30 tablet, Rfl: 1 .  Cholecalciferol (VITAMIN D) 2000 units CAPS, Take 2,000 Units by mouth daily., Disp: , Rfl:  .  clopidogrel (PLAVIX) 75 MG tablet, Take 1 tablet (75 mg total) daily by mouth., Disp: 30 tablet, Rfl: 1 .  esomeprazole (NEXIUM) 40 MG capsule, Take 1 capsule (40 mg total) by mouth 2 (two) times daily before a meal., Disp: 60 capsule, Rfl: 2 .  ferrous sulfate 325 (65 FE) MG tablet, Take 1 tablet (325 mg total) 2 (two) times daily with a meal by mouth., Disp: 60 tablet, Rfl: 3 .  folic acid (FOLVITE) 1 MG tablet, Take 1 tablet (1 mg total) daily by mouth., Disp: 30 tablet, Rfl: 1 .  losartan (COZAAR) 25 MG tablet, Take 0.5 tablets (12.5 mg total) daily by mouth., Disp: 15 tablet, Rfl: 1 .  metoprolol succinate (TOPROL-XL) 50 MG 24 hr tablet, Take 50 mg by mouth daily., Disp: , Rfl: 2 .  mometasone-formoterol (DULERA) 100-5 MCG/ACT AERO, Inhale 2 puffs 2 (two) times daily into the lungs., Disp: 1 Inhaler, Rfl: 1 .  Multiple Vitamin (MULTIVITAMIN WITH MINERALS) TABS tablet, Take 1 tablet by mouth daily., Disp: , Rfl:  .   oxyCODONE (OXY IR/ROXICODONE) 5 MG immediate release tablet, Take 1-2 tablets (5-10 mg total) every 6 (six) hours as needed by mouth for severe pain., Disp: 30 tablet, Rfl: 0 .  potassium chloride SA (K-DUR,KLOR-CON) 20 MEQ tablet, Take 1 tablet (20 mEq total) 2 (two) times daily by mouth. Do not take if torsemide is discontinued, Disp: 60 tablet, Rfl: 1 .  spironolactone (ALDACTONE) 25 MG tablet, Take 0.5 tablets (12.5 mg total) at bedtime by mouth., Disp: 30 tablet, Rfl: 1 .  SYMBICORT 160-4.5 MCG/ACT inhaler, Inhale 1 Package into the lungs 2 (two) times daily. 2 puffs twice daily, Disp: , Rfl: 4 .  torsemide (DEMADEX) 20 MG tablet, Take 2 tablets (40 mg total) 2 (two) times daily by mouth., Disp: 120 tablet, Rfl: 1 .  VENTOLIN HFA 108 (90 BASE) MCG/ACT inhaler, Inhale 2 puffs into the lungs every 6 (six) hours as needed for shortness of breath. , Disp: , Rfl: 0  Past Medical History: Past Medical History:  Diagnosis Date  . Asthma   . COPD (chronic obstructive pulmonary disease) ([place])   . GERD (gastroesophageal reflux disease)   . Shortness of breath dyspnea     Tobacco Use: Social History   Tobacco Use  Smoking Status Former Smoker  . Packs/day: 2.00  . Years: 40.00  . Pack years: 80.00  . Last attempt to  quit: 10/03/2010  . Years since quitting: 6.3  Smokeless Tobacco Never Used  Tobacco Comment   No longer using the Vapor    Labs: Recent Review Flowsheet Data    Labs for ITP Cardiac and Pulmonary Rehab Latest Ref Rng & Units 12/23/2016 12/24/2016 12/25/2016 12/26/2016 12/27/2016   Cholestrol 0 - 200 mg/dL - - - - -   LDLCALC 0 - 99 mg/dL - - - - -   HDL >40 mg/dL - - - - -   Trlycerides <150 mg/dL - - - - -   Hemoglobin A1c 4.8 - 5.6 % - - - - -   PHART 7.350 - 7.450 - - - - -   PCO2ART 32.0 - 48.0 mmHg - - - - -   HCO3 20.0 - 28.0 mmol/L - - - - -   TCO2 22 - 32 mmol/L - - - - -   O2SAT % 56.5 57.4 57.2 99.5 99.7      Capillary Blood Glucose: Lab Results   Component Value Date   GLUCAP 105 (H) 12/24/2016   GLUCAP 103 (H) 12/23/2016   GLUCAP 84 12/23/2016   GLUCAP 125 (H) 12/23/2016   GLUCAP 119 (H) 12/23/2016     Exercise Target Goals: Date: 01/31/17  Exercise Program Goal: Individual exercise prescription set with THRR, safety & activity barriers. Participant demonstrates ability to understand and report RPE using BORG scale, to self-measure pulse accurately, and to acknowledge the importance of the exercise prescription.  Exercise Prescription Goal: Starting with aerobic activity 30 plus minutes a day, 3 days per week for initial exercise prescription. Provide home exercise prescription and guidelines that participant acknowledges understanding prior to discharge.  Activity Barriers & Risk Stratification: Activity Barriers & Cardiac Risk Stratification - 01/31/17 1012      Activity Barriers & Cardiac Risk Stratification   Activity Barriers  Shortness of Breath;Chest Pain/Angina    Cardiac Risk Stratification  High       6 Minute Walk: 6 Minute Walk    Row Name 01/31/17 1011         6 Minute Walk   Phase  Initial     Distance  1050 feet     Distance % Change  0 %     Distance Feet Change  0 ft     Walk Time  6 minutes     # of Rest Breaks  0     MPH  1.98     METS  2.52     RPE  9     Perceived Dyspnea   11     VO2 Peak  9.15     Symptoms  No     Resting HR  85 bpm     Resting BP  110/70     Resting Oxygen Saturation   94 %     Exercise Oxygen Saturation  during 6 min walk  86 %     Max Ex. HR  98 bpm     Max Ex. BP  116/72     2 Minute Post BP  112/68        Oxygen Initial Assessment: Oxygen Initial Assessment - 01/31/17 1016      Home Oxygen   Home Oxygen Device  None    Sleep Oxygen Prescription  None    Home Exercise Oxygen Prescription  None      Initial 6 min Walk   Oxygen Used  None      Program Oxygen  Prescription   Program Oxygen Prescription  None       Oxygen  Re-Evaluation:   Oxygen Discharge (Final Oxygen Re-Evaluation):   Initial Exercise Prescription: Initial Exercise Prescription - 01/31/17 1000      Date of Initial Exercise RX and Referring Provider   Date  01/31/17    Referring Provider  Dr. Percival Spanish      Treadmill   MPH  1.3    Grade  0    Minutes  15    METs  1.9      NuStep   Level  1    SPM  64    Minutes  20    METs  1.6      Prescription Details   Frequency (times per week)  3    Duration  Progress to 30 minutes of continuous aerobic without signs/symptoms of physical distress      Intensity   THRR 40-80% of Max Heartrate  [phone]    Ratings of Perceived Exertion  11-13    Perceived Dyspnea  0-4      Progression   Progression  Continue progressive overload as per policy without signs/symptoms or physical distress.      Resistance Training   Training Prescription  Yes    Weight  1    Reps  10-15       Perform Capillary Blood Glucose checks as needed.  Exercise Prescription Changes:   Exercise Comments:   Exercise Goals and Review:  Exercise Goals    Row Name 01/31/17 1014             Exercise Goals   Increase Physical Activity  Yes       Intervention  Provide advice, education, support and counseling about physical activity/exercise needs.;Develop an individualized exercise prescription for aerobic and resistive training based on initial evaluation findings, risk stratification, comorbidities and participant's personal goals.       Expected Outcomes  Achievement of increased cardiorespiratory fitness and enhanced flexibility, muscular endurance and strength shown through measurements of functional capacity and personal statement of participant.       Increase Strength and Stamina  Yes       Intervention  Provide advice, education, support and counseling about physical activity/exercise needs.;Develop an individualized exercise prescription for aerobic and resistive training based on initial  evaluation findings, risk stratification, comorbidities and participant's personal goals.       Expected Outcomes  Achievement of increased cardiorespiratory fitness and enhanced flexibility, muscular endurance and strength shown through measurements of functional capacity and personal statement of participant.       Able to understand and use rate of perceived exertion (RPE) scale  Yes       Intervention  Provide education and explanation on how to use RPE scale       Expected Outcomes  Short Term: Able to use RPE daily in rehab to express subjective intensity level;Long Term:  Able to use RPE to guide intensity level when exercising independently       Able to understand and use Dyspnea scale  Yes       Intervention  Provide education and explanation on how to use Dyspnea scale       Expected Outcomes  Short Term: Able to use Dyspnea scale daily in rehab to express subjective sense of shortness of breath during exertion;Long Term: Able to use Dyspnea scale to guide intensity level when exercising independently       Knowledge and understanding of Target  Heart Rate Range (THRR)  Yes       Intervention  Provide education and explanation of THRR including how the numbers were predicted and where they are located for reference       Expected Outcomes  Short Term: Able to state/look up THRR       Able to check pulse independently  Yes       Intervention  Provide education and demonstration on how to check pulse in carotid and radial arteries.;Review the importance of being able to check your own pulse for safety during independent exercise       Expected Outcomes  Long Term: Able to check pulse independently and accurately;Short Term: Able to explain why pulse checking is important during independent exercise       Understanding of Exercise Prescription  Yes       Intervention  Provide education, explanation, and written materials on patient's individual exercise prescription       Expected Outcomes   Long Term: Able to explain home exercise prescription to exercise independently;Short Term: Able to explain program exercise prescription          Exercise Goals Re-Evaluation :    Discharge Exercise Prescription (Final Exercise Prescription Changes):   Nutrition:  Target Goals: Understanding of nutrition guidelines, daily intake of sodium 1500mg , cholesterol 200mg , calories 30% from fat and 7% or less from saturated fats, daily to have 5 or more servings of fruits and vegetables.  Biometrics: Pre Biometrics - 01/31/17 1014      Pre Biometrics   Height  5\' 5"  (1.651 m)    Weight  172 lb 11.2 oz (78.3 kg)    Waist Circumference  39 inches    Hip Circumference  41.5 inches    Waist to Hip Ratio  0.94 %    BMI (Calculated)  28.74    Triceps Skinfold  17 mm    % Body Fat  37.8 %    Grip Strength  41.2 kg    Flexibility  18.67 in    Single Leg Stand  3 seconds        Nutrition Therapy Plan and Nutrition Goals: Nutrition Therapy & Goals - 01/31/17 1017      Personal Nutrition Goals   Personal Goal #2  On a low sodium diet and continuing to eat heart healthy.     Additional Goals?  No       Nutrition Discharge: Rate Your Plate Scores: Nutrition Assessments - 01/31/17 1018      MEDFICTS Scores   Pre Score  68       Nutrition Goals Re-Evaluation:   Nutrition Goals Discharge (Final Nutrition Goals Re-Evaluation):   Psychosocial: Target Goals: Acknowledge presence or absence of significant depression and/or stress, maximize coping skills, provide positive support system. Participant is able to verbalize types and ability to use techniques and skills needed for reducing stress and depression.  Initial Review & Psychosocial Screening: Initial Psych Review & Screening - 01/31/17 1031      Initial Review   Current issues with  None Identified      Family Dynamics   Good Support System?  Yes      Barriers   Psychosocial barriers to participate in program  There  are no identifiable barriers or psychosocial needs.      Screening Interventions   Interventions  Encouraged to exercise       Quality of Life Scores: Quality of Life - 01/31/17 1016  Quality of Life Scores   Health/Function Pre  25.17 %    Socioeconomic Pre  29.14 %    Psych/Spiritual Pre  29.14 %    Family Pre  26.4 %    GLOBAL Pre  26.99 %       PHQ-9: Recent Review Flowsheet Data    Depression screen [facility] 2/9 01/31/2017   Decreased Interest 0   Down, Depressed, Hopeless 0   PHQ - 2 Score 0   Altered sleeping 0   Tired, decreased energy 2   Change in appetite 0   Feeling bad or failure about yourself  0   Trouble concentrating 0   Moving slowly or fidgety/restless 0   Suicidal thoughts 0   PHQ-9 Score 2   Difficult doing work/chores Somewhat difficult     Interpretation of Total Score  Total Score Depression Severity:  1-4 = Minimal depression, 5-9 = Mild depression, 10-14 = Moderate depression, 15-19 = Moderately severe depression, 20-27 = Severe depression   Psychosocial Evaluation and Intervention: Psychosocial Evaluation - 01/31/17 1032      Psychosocial Evaluation & Interventions   Interventions  Encouraged to exercise with the program and follow exercise prescription    Continue Psychosocial Services   No Follow up required       Psychosocial Re-Evaluation:   Psychosocial Discharge (Final Psychosocial Re-Evaluation):   Vocational Rehabilitation: Provide vocational rehab assistance to qualifying candidates.   Vocational Rehab Evaluation & Intervention: Vocational Rehab - 01/31/17 1005      Initial Vocational Rehab Evaluation & Intervention   Assessment shows need for Vocational Rehabilitation  No       Education: Education Goals: Education classes will be provided on a weekly basis, covering required topics. Participant will state understanding/return demonstration of topics presented.  Learning Barriers/Preferences: Learning  Barriers/Preferences - 01/31/17 1009      Learning Barriers/Preferences   Learning Barriers  Hearing    Learning Preferences  Pictoral;Individual Instruction;Group Instruction       Education Topics: Hypertension, Hypertension Reduction -Define heart disease and high blood pressure. Discus how high blood pressure affects the body and ways to reduce high blood pressure.   Exercise and Your Heart -Discuss why it is important to exercise, the FITT principles of exercise, normal and abnormal responses to exercise, and how to exercise safely.   Angina -Discuss definition of angina, causes of angina, treatment of angina, and how to decrease risk of having angina.   Cardiac Medications -Review what the following cardiac medications are used for, how they affect the body, and side effects that may occur when taking the medications.  Medications include Aspirin, Beta blockers, calcium channel blockers, ACE Inhibitors, angiotensin receptor blockers, diuretics, digoxin, and antihyperlipidemics.   Congestive Heart Failure -Discuss the definition of CHF, how to live with CHF, the signs and symptoms of CHF, and how keep track of weight and sodium intake.   Heart Disease and Intimacy -Discus the effect sexual activity has on the heart, how changes occur during intimacy as we age, and safety during sexual activity.   Smoking Cessation / COPD -Discuss different methods to quit smoking, the health benefits of quitting smoking, and the definition of COPD.   Nutrition I: Fats -Discuss the types of cholesterol, what cholesterol does to the heart, and how cholesterol levels can be controlled.   Nutrition II: Labels -Discuss the different components of food labels and how to read food label   Heart Parts and Heart Disease -Discuss the anatomy of the heart,  the pathway of blood circulation through the heart, and these are affected by heart disease.   Stress I: Signs and Symptoms -Discuss  the causes of stress, how stress may lead to anxiety and depression, and ways to limit stress.   Stress II: Relaxation -Discuss different types of relaxation techniques to limit stress.   Warning Signs of Stroke / TIA -Discuss definition of a stroke, what the signs and symptoms are of a stroke, and how to identify when someone is having stroke.   Knowledge Questionnaire Score: Knowledge Questionnaire Score - 01/31/17 1004      Knowledge Questionnaire Score   Pre Score  20/24       Core Components/Risk Factors/Patient Goals at Admission: Personal Goals and Risk Factors at Admission - 01/31/17 1019      Core Components/Risk Factors/Patient Goals on Admission    Weight Management  Yes    Admit Weight  172 lb 11.2 oz (78.3 kg)    Goal Weight: Short Term  167 lb 11.2 oz (76.1 kg)    Goal Weight: Long Term  162 lb 11.2 oz (73.8 kg)    Expected Outcomes  Short Term: Continue to assess and modify interventions until short term weight is achieved;Long Term: Adherence to nutrition and physical activity/exercise program aimed toward attainment of established weight goal    Personal Goal Other  Yes    Personal Goal  Have more energy, to gain the habit of exercise, lose 20lbs    Intervention  Attend CR 3x week and to supplement with home exercise 2 x week.     Expected Outcomes  Reach goals.        Core Components/Risk Factors/Patient Goals Review:    Core Components/Risk Factors/Patient Goals at Discharge (Final Review):    ITP Comments:   Comments: Patient arrived for 1st visit/orientation/education at 0800. Patient was referred to CR by Dr. Percival Spanish due to CABGx3 (Z95.1). During orientation advised patient on arrival and appointment times what to wear, what to do before, during and after exercise. Reviewed attendance and class policy. Talked about inclement weather and class consultation policy. Pt is scheduled to return Cardiac Rehab on !04/05/16 at 11:00. Pt was advised to come to  class 15 minutes before class starts. Patient was also given instructions on meeting with the dietician and attending the Family Structure classes. Discussed RPE/Dpysnea scales. Discussed initial THR and how to find their radial and/or carotid pulse. Discussed the initial exercise prescription and how this effects their progress. Pt is eager to get started. Patient participated in warm up stretches followed by light weights and resistance bands. Patient was able to complete 6 minute walk test. Patient did not c/o pain. Patient was measured for the equipment. Discussed equipment safety with patient. Took patient pre-anthropometric measurements. Patient finished visit at 11:00.

## 2017-01-31 NOTE — Progress Notes (Signed)
The wound is clean.  Patient reports some draining.  Advised patient to use some antibiotic ointment and keep undressed.  She will monitor it and call if any worse.  The patient is alerted to watch for any signs of infection (redness, pus, pain, increased swelling or fever) and call if such occurs. Home wound care instructions are provided.

## 2017-01-31 NOTE — Progress Notes (Signed)
Cardiac/Pulmonary Rehab Medication Review by a Pharmacist  Does the patient  feel that his/her medications are working for him/her?  yes  Has the patient been experiencing any side effects to the medications prescribed?  no  Does the patient measure his/her own blood pressure or blood glucose at home?  yes   Does the patient have any problems obtaining medications due to transportation or finances?   no  Understanding of regimen: good Understanding of indications: good Potential of compliance: excellent  Questions asked to Determine Patient Understanding of Medication Regimen:  1. What is the name of the medication?  2. What is the medication used for?  3. When should it be taken?  4. How much should be taken?  5. How will you take it?  6. What side effects should you report?  Understanding Defined as: Excellent: All questions above are correct Good: Questions 1-4 are correct Fair: Questions 1-2 are correct  Poor: 1 or none of the above questions are correct   Pharmacist comments: Patient is compliant with medications and knows most indications for  current regimen. She is measuring her BP daily and as needed. She is tolerating the current regimen. She weighs herself daily and Is looking forward to getting off the fluid pills.  She is on her way to see Dr. Berlin Hun for surgical site incision concerns.   Thanks for the opportunity to participate in the care of this patient,  Isac Sarna, BS [place], [place] Clinical Pharmacist Pager [phone] 01/31/2017 10:44 AM

## 2017-01-31 NOTE — Progress Notes (Signed)
Daily Session Note  Patient Details  Name: Emily Rocha MRN: [id number] Date of Birth: 1955/07/18 Referring Provider:     CARDIAC REHAB PHASE II ORIENTATION from 01/31/2017 in [place]  Referring Provider  Dr. Percival Spanish      Encounter Date: 01/31/2017  Check In: Session Check In - 01/31/17 0800      Check-In   Location  [facility]    Staff Present  Louie Meaders Angelina Pih, MS, EP, [facility], Exercise Physiologist;Gregory Luther Parody, BS, EP, Exercise Physiologist;Debra Wynetta Emery, RN, BSN    Supervising physician immediately available to respond to emergencies  See telemetry face sheet for immediately available MD    Medication changes reported      No    Fall or balance concerns reported     No    Tobacco Cessation  -- Stopped vaping 12/12/16    Warm-up and Cool-down  Performed as group-led instruction    Resistance Training Performed  Yes    VAD Patient?  No      Pain Assessment   Currently in Pain?  No/denies    Pain Score  0-No pain    Multiple Pain Sites  No       Capillary Blood Glucose: No results found for this or any previous visit (from the past 24 hour(s)).    Social History   Tobacco Use  Smoking Status Former Smoker  . Packs/day: 2.00  . Years: 40.00  . Pack years: 80.00  . Last attempt to quit: 10/03/2010  . Years since quitting: 6.3  Smokeless Tobacco Never Used  Tobacco Comment   No longer using the Vapor    Goals Met:  Independence with exercise equipment Improved SOB with ADL's Exercise tolerated well No report of cardiac concerns or symptoms Strength training completed today  Goals Unmet:  Not Applicable  Comments: Check out at 10:45   Dr. Kate Sable is Medical Director for [facility] and Pulmonary Rehab.

## 2017-01-31 NOTE — Progress Notes (Signed)
Cardiac Individual Treatment Plan  Patient Details  Name: Emily Rocha MRN: [id number] Date of Birth: Mar 14, 1955 Referring Provider:     [place] from 01/31/2017 in [place]  Referring Provider  Dr. Percival Spanish      Initial Encounter Date:    CARDIAC REHAB PHASE II ORIENTATION from 01/31/2017 in [place]  Date  01/31/17  Referring Provider  Dr. Percival Spanish      Visit Diagnosis: S/P CABG x 3  Patient's Home Medications on Admission:  Current Outpatient Medications:  .  aspirin 81 MG chewable tablet, Chew 81 mg by mouth daily., Disp: , Rfl:  .  ALPRAZolam (XANAX) 0.5 MG tablet, Take 0.5 mg by mouth 2 (two) times daily as needed for anxiety. , Disp: , Rfl:  .  amiodarone (PACERONE) 200 MG tablet, Take 1 tablet (200 mg total) by mouth daily., Disp: 30 tablet, Rfl: 1 .  atorvastatin (LIPITOR) 40 MG tablet, Take 1 tablet (40 mg total) daily by mouth., Disp: 30 tablet, Rfl: 1 .  Cholecalciferol (VITAMIN D) 2000 units CAPS, Take 2,000 Units by mouth daily., Disp: , Rfl:  .  clopidogrel (PLAVIX) 75 MG tablet, Take 1 tablet (75 mg total) daily by mouth., Disp: 30 tablet, Rfl: 1 .  esomeprazole (NEXIUM) 40 MG capsule, Take 1 capsule (40 mg total) by mouth 2 (two) times daily before a meal., Disp: 60 capsule, Rfl: 2 .  losartan (COZAAR) 25 MG tablet, Take 0.5 tablets (12.5 mg total) daily by mouth., Disp: 15 tablet, Rfl: 1 .  metoprolol succinate (TOPROL-XL) 50 MG 24 hr tablet, Take 50 mg by mouth daily., Disp: , Rfl: 2 .  mometasone-formoterol (DULERA) 100-5 MCG/ACT AERO, Inhale 2 puffs 2 (two) times daily into the lungs., Disp: 1 Inhaler, Rfl: 1 .  Multiple Vitamin (MULTIVITAMIN WITH MINERALS) TABS tablet, Take 1 tablet by mouth daily., Disp: , Rfl:  .  potassium chloride SA (K-DUR,KLOR-CON) 20 MEQ tablet, Take 1 tablet (20 mEq total) 2 (two) times daily by mouth. Do not take if torsemide is discontinued, Disp: 60 tablet,  Rfl: 1 .  spironolactone (ALDACTONE) 25 MG tablet, Take 0.5 tablets (12.5 mg total) at bedtime by mouth., Disp: 30 tablet, Rfl: 1 .  SYMBICORT 160-4.5 MCG/ACT inhaler, Inhale 1 Package into the lungs 2 (two) times daily. 2 puffs twice daily, Disp: , Rfl: 4 .  torsemide (DEMADEX) 20 MG tablet, Take 2 tablets (40 mg total) 2 (two) times daily by mouth., Disp: 120 tablet, Rfl: 1 .  VENTOLIN HFA 108 (90 BASE) MCG/ACT inhaler, Inhale 2 puffs into the lungs every 6 (six) hours as needed for shortness of breath. , Disp: , Rfl: 0  Past Medical History: Past Medical History:  Diagnosis Date  . Asthma   . COPD (chronic obstructive pulmonary disease) ([place])   . GERD (gastroesophageal reflux disease)   . Shortness of breath dyspnea     Tobacco Use: Social History   Tobacco Use  Smoking Status Former Smoker  . Packs/day: 2.00  . Years: 40.00  . Pack years: 80.00  . Last attempt to quit: 10/03/2010  . Years since quitting: 6.3  Smokeless Tobacco Never Used  Tobacco Comment   No longer using the Vapor    Labs: Recent Review Flowsheet Data    Labs for ITP Cardiac and Pulmonary Rehab Latest Ref Rng & Units 12/23/2016 12/24/2016 12/25/2016 12/26/2016 12/27/2016   Cholestrol 0 - 200 mg/dL - - - - -   LDLCALC  0 - 99 mg/dL - - - - -   HDL >40 mg/dL - - - - -   Trlycerides <150 mg/dL - - - - -   Hemoglobin A1c 4.8 - 5.6 % - - - - -   PHART 7.350 - 7.450 - - - - -   PCO2ART 32.0 - 48.0 mmHg - - - - -   HCO3 20.0 - 28.0 mmol/L - - - - -   TCO2 22 - 32 mmol/L - - - - -   O2SAT % 56.5 57.4 57.2 99.5 99.7      Capillary Blood Glucose: Lab Results  Component Value Date   GLUCAP 105 (H) 12/24/2016   GLUCAP 103 (H) 12/23/2016   GLUCAP 84 12/23/2016   GLUCAP 125 (H) 12/23/2016   GLUCAP 119 (H) 12/23/2016     Exercise Target Goals: Date: 01/31/17  Exercise Program Goal: Individual exercise prescription set with THRR, safety & activity barriers. Participant demonstrates ability to understand  and report RPE using BORG scale, to self-measure pulse accurately, and to acknowledge the importance of the exercise prescription.  Exercise Prescription Goal: Starting with aerobic activity 30 plus minutes a day, 3 days per week for initial exercise prescription. Provide home exercise prescription and guidelines that participant acknowledges understanding prior to discharge.  Activity Barriers & Risk Stratification: Activity Barriers & Cardiac Risk Stratification - 01/31/17 1012      Activity Barriers & Cardiac Risk Stratification   Activity Barriers  Shortness of Breath;Chest Pain/Angina    Cardiac Risk Stratification  High       6 Minute Walk: 6 Minute Walk    Row Name 01/31/17 1011         6 Minute Walk   Phase  Initial     Distance  1050 feet     Distance % Change  0 %     Distance Feet Change  0 ft     Walk Time  6 minutes     # of Rest Breaks  0     MPH  1.98     METS  2.52     RPE  9     Perceived Dyspnea   11     VO2 Peak  9.15     Symptoms  No     Resting HR  85 bpm     Resting BP  110/70     Resting Oxygen Saturation   94 %     Exercise Oxygen Saturation  during 6 min walk  86 %     Max Ex. HR  98 bpm     Max Ex. BP  116/72     2 Minute Post BP  112/68        Oxygen Initial Assessment: Oxygen Initial Assessment - 01/31/17 1016      Home Oxygen   Home Oxygen Device  None    Sleep Oxygen Prescription  None    Home Exercise Oxygen Prescription  None      Initial 6 min Walk   Oxygen Used  None      Program Oxygen Prescription   Program Oxygen Prescription  None       Oxygen Re-Evaluation:   Oxygen Discharge (Final Oxygen Re-Evaluation):   Initial Exercise Prescription: Initial Exercise Prescription - 01/31/17 1000      Date of Initial Exercise RX and Referring Provider   Date  01/31/17    Referring Provider  Dr. Percival Spanish      Treadmill  MPH  1.3    Grade  0    Minutes  15    METs  1.9      NuStep   Level  1    SPM  64    Minutes   20    METs  1.6      Prescription Details   Frequency (times per week)  3    Duration  Progress to 30 minutes of continuous aerobic without signs/symptoms of physical distress      Intensity   THRR 40-80% of Max Heartrate  [phone]    Ratings of Perceived Exertion  11-13    Perceived Dyspnea  0-4      Progression   Progression  Continue progressive overload as per policy without signs/symptoms or physical distress.      Resistance Training   Training Prescription  Yes    Weight  1    Reps  10-15       Perform Capillary Blood Glucose checks as needed.  Exercise Prescription Changes:   Exercise Comments:   Exercise Goals and Review:  Exercise Goals    Row Name 01/31/17 1014             Exercise Goals   Increase Physical Activity  Yes       Intervention  Provide advice, education, support and counseling about physical activity/exercise needs.;Develop an individualized exercise prescription for aerobic and resistive training based on initial evaluation findings, risk stratification, comorbidities and participant's personal goals.       Expected Outcomes  Achievement of increased cardiorespiratory fitness and enhanced flexibility, muscular endurance and strength shown through measurements of functional capacity and personal statement of participant.       Increase Strength and Stamina  Yes       Intervention  Provide advice, education, support and counseling about physical activity/exercise needs.;Develop an individualized exercise prescription for aerobic and resistive training based on initial evaluation findings, risk stratification, comorbidities and participant's personal goals.       Expected Outcomes  Achievement of increased cardiorespiratory fitness and enhanced flexibility, muscular endurance and strength shown through measurements of functional capacity and personal statement of participant.       Able to understand and use rate of perceived exertion (RPE)  scale  Yes       Intervention  Provide education and explanation on how to use RPE scale       Expected Outcomes  Short Term: Able to use RPE daily in rehab to express subjective intensity level;Long Term:  Able to use RPE to guide intensity level when exercising independently       Able to understand and use Dyspnea scale  Yes       Intervention  Provide education and explanation on how to use Dyspnea scale       Expected Outcomes  Short Term: Able to use Dyspnea scale daily in rehab to express subjective sense of shortness of breath during exertion;Long Term: Able to use Dyspnea scale to guide intensity level when exercising independently       Knowledge and understanding of Target Heart Rate Range (THRR)  Yes       Intervention  Provide education and explanation of THRR including how the numbers were predicted and where they are located for reference       Expected Outcomes  Short Term: Able to state/look up THRR       Able to check pulse independently  Yes  Intervention  Provide education and demonstration on how to check pulse in carotid and radial arteries.;Review the importance of being able to check your own pulse for safety during independent exercise       Expected Outcomes  Long Term: Able to check pulse independently and accurately;Short Term: Able to explain why pulse checking is important during independent exercise       Understanding of Exercise Prescription  Yes       Intervention  Provide education, explanation, and written materials on patient's individual exercise prescription       Expected Outcomes  Long Term: Able to explain home exercise prescription to exercise independently;Short Term: Able to explain program exercise prescription          Exercise Goals Re-Evaluation :    Discharge Exercise Prescription (Final Exercise Prescription Changes):   Nutrition:  Target Goals: Understanding of nutrition guidelines, daily intake of sodium 1500mg , cholesterol 200mg ,  calories 30% from fat and 7% or less from saturated fats, daily to have 5 or more servings of fruits and vegetables.  Biometrics: Pre Biometrics - 01/31/17 1014      Pre Biometrics   Height  5\' 5"  (1.651 m)    Weight  172 lb 11.2 oz (78.3 kg)    Waist Circumference  39 inches    Hip Circumference  41.5 inches    Waist to Hip Ratio  0.94 %    BMI (Calculated)  28.74    Triceps Skinfold  17 mm    % Body Fat  37.8 %    Grip Strength  41.2 kg    Flexibility  18.67 in    Single Leg Stand  3 seconds        Nutrition Therapy Plan and Nutrition Goals: Nutrition Therapy & Goals - 01/31/17 1017      Personal Nutrition Goals   Personal Goal #2  On a low sodium diet and continuing to eat heart healthy.     Additional Goals?  No       Nutrition Discharge: Rate Your Plate Scores: Nutrition Assessments - 01/31/17 1018      MEDFICTS Scores   Pre Score  68       Nutrition Goals Re-Evaluation:   Nutrition Goals Discharge (Final Nutrition Goals Re-Evaluation):   Psychosocial: Target Goals: Acknowledge presence or absence of significant depression and/or stress, maximize coping skills, provide positive support system. Participant is able to verbalize types and ability to use techniques and skills needed for reducing stress and depression.  Initial Review & Psychosocial Screening: Initial Psych Review & Screening - 01/31/17 1031      Initial Review   Current issues with  None Identified      Family Dynamics   Good Support System?  Yes      Barriers   Psychosocial barriers to participate in program  There are no identifiable barriers or psychosocial needs.      Screening Interventions   Interventions  Encouraged to exercise       Quality of Life Scores: Quality of Life - 01/31/17 1016      Quality of Life Scores   Health/Function Pre  25.17 %    Socioeconomic Pre  29.14 %    Psych/Spiritual Pre  29.14 %    Family Pre  26.4 %    GLOBAL Pre  26.99 %        PHQ-9: Recent Review Flowsheet Data    Depression screen [facility] 2/9 01/31/2017   Decreased Interest 0  Down, Depressed, Hopeless 0   PHQ - 2 Score 0   Altered sleeping 0   Tired, decreased energy 2   Change in appetite 0   Feeling bad or failure about yourself  0   Trouble concentrating 0   Moving slowly or fidgety/restless 0   Suicidal thoughts 0   PHQ-9 Score 2   Difficult doing work/chores Somewhat difficult     Interpretation of Total Score  Total Score Depression Severity:  1-4 = Minimal depression, 5-9 = Mild depression, 10-14 = Moderate depression, 15-19 = Moderately severe depression, 20-27 = Severe depression   Psychosocial Evaluation and Intervention: Psychosocial Evaluation - 01/31/17 1032      Psychosocial Evaluation & Interventions   Interventions  Encouraged to exercise with the program and follow exercise prescription    Continue Psychosocial Services   No Follow up required       Psychosocial Re-Evaluation:   Psychosocial Discharge (Final Psychosocial Re-Evaluation):   Vocational Rehabilitation: Provide vocational rehab assistance to qualifying candidates.   Vocational Rehab Evaluation & Intervention: Vocational Rehab - 01/31/17 1005      Initial Vocational Rehab Evaluation & Intervention   Assessment shows need for Vocational Rehabilitation  No       Education: Education Goals: Education classes will be provided on a weekly basis, covering required topics. Participant will state understanding/return demonstration of topics presented.  Learning Barriers/Preferences: Learning Barriers/Preferences - 01/31/17 1009      Learning Barriers/Preferences   Learning Barriers  Hearing    Learning Preferences  Pictoral;Individual Instruction;Group Instruction       Education Topics: Hypertension, Hypertension Reduction -Define heart disease and high blood pressure. Discus how high blood pressure affects the body and ways to reduce high blood  pressure.   Exercise and Your Heart -Discuss why it is important to exercise, the FITT principles of exercise, normal and abnormal responses to exercise, and how to exercise safely.   Angina -Discuss definition of angina, causes of angina, treatment of angina, and how to decrease risk of having angina.   Cardiac Medications -Review what the following cardiac medications are used for, how they affect the body, and side effects that may occur when taking the medications.  Medications include Aspirin, Beta blockers, calcium channel blockers, ACE Inhibitors, angiotensin receptor blockers, diuretics, digoxin, and antihyperlipidemics.   Congestive Heart Failure -Discuss the definition of CHF, how to live with CHF, the signs and symptoms of CHF, and how keep track of weight and sodium intake.   Heart Disease and Intimacy -Discus the effect sexual activity has on the heart, how changes occur during intimacy as we age, and safety during sexual activity.   Smoking Cessation / COPD -Discuss different methods to quit smoking, the health benefits of quitting smoking, and the definition of COPD.   Nutrition I: Fats -Discuss the types of cholesterol, what cholesterol does to the heart, and how cholesterol levels can be controlled.   Nutrition II: Labels -Discuss the different components of food labels and how to read food label   Heart Parts and Heart Disease -Discuss the anatomy of the heart, the pathway of blood circulation through the heart, and these are affected by heart disease.   Stress I: Signs and Symptoms -Discuss the causes of stress, how stress may lead to anxiety and depression, and ways to limit stress.   Stress II: Relaxation -Discuss different types of relaxation techniques to limit stress.   Warning Signs of Stroke / TIA -Discuss definition of a stroke, what the  signs and symptoms are of a stroke, and how to identify when someone is having stroke.   Knowledge  Questionnaire Score: Knowledge Questionnaire Score - 01/31/17 1004      Knowledge Questionnaire Score   Pre Score  20/24       Core Components/Risk Factors/Patient Goals at Admission: Personal Goals and Risk Factors at Admission - 01/31/17 1019      Core Components/Risk Factors/Patient Goals on Admission    Weight Management  Yes    Admit Weight  172 lb 11.2 oz (78.3 kg)    Goal Weight: Short Term  167 lb 11.2 oz (76.1 kg)    Goal Weight: Long Term  162 lb 11.2 oz (73.8 kg)    Expected Outcomes  Short Term: Continue to assess and modify interventions until short term weight is achieved;Long Term: Adherence to nutrition and physical activity/exercise program aimed toward attainment of established weight goal    Personal Goal Other  Yes    Personal Goal  Have more energy, to gain the habit of exercise, lose 20lbs    Intervention  Attend CR 3x week and to supplement with home exercise 2 x week.     Expected Outcomes  Reach goals.        Core Components/Risk Factors/Patient Goals Review:    Core Components/Risk Factors/Patient Goals at Discharge (Final Review):    ITP Comments: ITP Comments    Row Name 01/31/17 1314           ITP Comments  Patient new to program. Plans to start Wednesday 02/02/17.          Comments: ITP 30 Day REVIEW Patient new to program. Plans to start Wednesday 02/02/17.

## 2017-02-02 ENCOUNTER — Telehealth: Payer: Self-pay

## 2017-02-02 ENCOUNTER — Encounter (HOSPITAL_COMMUNITY)
Admission: RE | Admit: 2017-02-02 | Discharge: 2017-02-02 | Disposition: A | Discharge status: Home / Self Care | Payer: BLUE CROSS/BLUE SHIELD | Source: Ambulatory Visit | Attending: Cardiology | Admitting: Cardiology

## 2017-02-02 DIAGNOSIS — Z951 Presence of aortocoronary bypass graft: Secondary | ICD-10-CM | POA: Diagnosis not present

## 2017-02-02 NOTE — Progress Notes (Signed)
Daily Session Note  Patient Details  Name: Emily Rocha MRN: [id number] Date of Birth: [birth year] Referring Provider:     CARDIAC REHAB PHASE II ORIENTATION from 01/31/2017 in [place]  Referring Provider  Dr. Percival Spanish      Encounter Date: 02/02/2017  Check In: Session Check In - 02/02/17 1047      Check-In   Location  [facility]    Staff Present  Suzanne Boron, BS, EP, Exercise Physiologist;Debra Wynetta Emery, RN, BSN    Supervising physician immediately available to respond to emergencies  See telemetry face sheet for immediately available MD    Medication changes reported      No    Fall or balance concerns reported     No    Warm-up and Cool-down  Performed as group-led instruction    Resistance Training Performed  Yes    VAD Patient?  No      Pain Assessment   Currently in Pain?  No/denies    Pain Score  0-No pain    Multiple Pain Sites  No       Capillary Blood Glucose: No results found for this or any previous visit (from the past 24 hour(s)).    Social History   Tobacco Use  Smoking Status Former Smoker  . Packs/day: 2.00  . Years: 40.00  . Pack years: 80.00  . Last attempt to quit: 10/03/2010  . Years since quitting: 6.3  Smokeless Tobacco Never Used  Tobacco Comment   No longer using the Vapor    Goals Met:  Independence with exercise equipment Exercise tolerated well No report of cardiac concerns or symptoms Strength training completed today  Goals Unmet:  Not Applicable  Comments: Check out 1200   Dr. Kate Sable is Medical Director for [place] and Pulmonary Rehab.

## 2017-02-02 NOTE — Telephone Encounter (Signed)
Patient called concerned if she was to stop or continue taking her Plavix.  At her last appointment she could not remember what she was told.  Upon search from notes, nowhere does it state to discontinue this medication, however I will refer to Dr Darcey Nora.

## 2017-02-04 ENCOUNTER — Encounter (HOSPITAL_COMMUNITY)
Admission: RE | Admit: 2017-02-04 | Discharge: 2017-02-04 | Disposition: A | Discharge status: Home / Self Care | Payer: BLUE CROSS/BLUE SHIELD | Source: Ambulatory Visit | Attending: Cardiology | Admitting: Cardiology

## 2017-02-04 DIAGNOSIS — Z951 Presence of aortocoronary bypass graft: Secondary | ICD-10-CM | POA: Diagnosis not present

## 2017-02-04 NOTE — Progress Notes (Signed)
Daily Session Note  Patient Details  Name: Emily Rocha MRN: [id number] Date of Birth: 1955/05/31 Referring Provider:     CARDIAC REHAB PHASE II ORIENTATION from 01/31/2017 in [place]  Referring Provider  Dr. Percival Spanish      Encounter Date: 02/04/2017  Check In: Session Check In - 02/04/17 1111      Check-In   Location  [facility]    Staff Present  Natthew Marlatt Angelina Pih, MS, EP, [facility], Exercise Physiologist;Debra Wynetta Emery, RN, BSN    Supervising physician immediately available to respond to emergencies  See telemetry face sheet for immediately available MD    Medication changes reported      No    Fall or balance concerns reported     No    Warm-up and Cool-down  Performed as group-led instruction    Resistance Training Performed  Yes    VAD Patient?  No      Pain Assessment   Currently in Pain?  No/denies    Pain Score  0-No pain    Multiple Pain Sites  No       Capillary Blood Glucose: No results found for this or any previous visit (from the past 24 hour(s)).    Social History   Tobacco Use  Smoking Status Former Smoker  . Packs/day: 2.00  . Years: 40.00  . Pack years: 80.00  . Last attempt to quit: 10/03/2010  . Years since quitting: 6.3  Smokeless Tobacco Never Used  Tobacco Comment   No longer using the Vapor    Goals Met:  Independence with exercise equipment Exercise tolerated well No report of cardiac concerns or symptoms Strength training completed today  Goals Unmet:  Not Applicable  Comments: Check out: 12:00   Dr. Kate Sable is Medical Director for [place] and Pulmonary Rehab.

## 2017-02-07 ENCOUNTER — Ambulatory Visit (INDEPENDENT_AMBULATORY_CARE_PROVIDER_SITE_OTHER): Payer: Self-pay

## 2017-02-07 ENCOUNTER — Encounter (HOSPITAL_COMMUNITY): Payer: BLUE CROSS/BLUE SHIELD

## 2017-02-07 DIAGNOSIS — Z4889 Encounter for other specified surgical aftercare: Secondary | ICD-10-CM

## 2017-02-09 ENCOUNTER — Ambulatory Visit (INDEPENDENT_AMBULATORY_CARE_PROVIDER_SITE_OTHER): Payer: Self-pay | Admitting: *Deleted

## 2017-02-09 ENCOUNTER — Encounter (HOSPITAL_COMMUNITY)
Admission: RE | Admit: 2017-02-09 | Discharge: 2017-02-09 | Disposition: A | Discharge status: Home / Self Care | Payer: BLUE CROSS/BLUE SHIELD | Source: Ambulatory Visit | Attending: Cardiology | Admitting: Cardiology

## 2017-02-09 DIAGNOSIS — Z951 Presence of aortocoronary bypass graft: Secondary | ICD-10-CM | POA: Diagnosis not present

## 2017-02-09 DIAGNOSIS — Z5189 Encounter for other specified aftercare: Secondary | ICD-10-CM

## 2017-02-09 DIAGNOSIS — Z4889 Encounter for other specified surgical aftercare: Secondary | ICD-10-CM

## 2017-02-09 NOTE — Progress Notes (Signed)
Patient received her take home exercise plan today 02/09/2017. THR was addressed as were safety tips for being active when not in the CR program. Patient demonstrated an understanding and was encouraged to ask any future questions as they arise.

## 2017-02-09 NOTE — Progress Notes (Signed)
Daily Session Note  Patient Details  Name: Emily Rocha MRN: [id number] Date of Birth: 1955-11-04 Referring Provider:     CARDIAC REHAB [place] from 01/31/2017 in [place]  Referring Provider  Dr. Percival Spanish      Encounter Date: 02/09/2017  Check In: Session Check In - 02/09/17 1055      Check-In   Location  [facility]    Staff Present  Diane Angelina Pih, MS, EP, [facility], Exercise Physiologist;Jceon Alverio Luther Parody, BS, EP, Exercise Physiologist    Supervising physician immediately available to respond to emergencies  See telemetry face sheet for immediately available MD    Medication changes reported      No    Fall or balance concerns reported     No    Warm-up and Cool-down  Performed as group-led instruction    Resistance Training Performed  Yes    VAD Patient?  No      Pain Assessment   Currently in Pain?  No/denies    Pain Score  0-No pain    Multiple Pain Sites  No       Capillary Blood Glucose: No results found for this or any previous visit (from the past 24 hour(s)).    Social History   Tobacco Use  Smoking Status Former Smoker  . Packs/day: 2.00  . Years: 40.00  . Pack years: 80.00  . Last attempt to quit: 10/03/2010  . Years since quitting: 6.3  Smokeless Tobacco Never Used  Tobacco Comment   No longer using the Vapor    Goals Met:  Independence with exercise equipment Exercise tolerated well No report of cardiac concerns or symptoms Strength training completed today  Goals Unmet:  Not Applicable  Comments: Check out 1200   Dr. Kate Sable is Medical Director for [place] and Pulmonary Rehab.

## 2017-02-10 NOTE — Progress Notes (Signed)
Emily Rocha returns for a wound check of a sternal area that was of concern when she was here for suture removal a couple of weeks ago. The area still hasn't improved and after I explored it with an applicator the area was deeper. I cleansed the area with peroxide, then saline and packed with 1/4" plain NU GAUZE. Her husband was eager to be taught to perform daily  dressing changes. Supplies were given. They were told to notify us for obvious redness, fever and/or pus from the site. Otherwise, she will return as scheduled with a CXR.

## 2017-02-11 ENCOUNTER — Encounter (HOSPITAL_COMMUNITY)
Admission: RE | Admit: 2017-02-11 | Discharge: 2017-02-11 | Disposition: A | Discharge status: Home / Self Care | Payer: BLUE CROSS/BLUE SHIELD | Source: Ambulatory Visit | Attending: Cardiology | Admitting: Cardiology

## 2017-02-11 DIAGNOSIS — Z951 Presence of aortocoronary bypass graft: Secondary | ICD-10-CM | POA: Diagnosis not present

## 2017-02-11 NOTE — Progress Notes (Signed)
Daily Session Note  Patient Details  Name: Emily Rocha MRN: [id number] Date of Birth: 05/01/1955 Referring Provider:     CARDIAC REHAB PHASE II ORIENTATION from 01/31/2017 in [place]  Referring Provider  Dr. Percival Spanish      Encounter Date: 02/11/2017  Check In: Session Check In - 02/11/17 1100      Check-In   Location  [facility]    Staff Present  Iyanna Drummer Angelina Pih, MS, EP, [facility], Exercise Physiologist;Gregory Luther Parody, BS, EP, Exercise Physiologist    Supervising physician immediately available to respond to emergencies  See telemetry face sheet for immediately available MD    Medication changes reported      No    Fall or balance concerns reported     No    Tobacco Cessation  No Change    Warm-up and Cool-down  Performed as group-led instruction    Resistance Training Performed  Yes    VAD Patient?  No      Pain Assessment   Currently in Pain?  No/denies    Pain Score  0-No pain    Multiple Pain Sites  No       Capillary Blood Glucose: No results found for this or any previous visit (from the past 24 hour(s)).    Social History   Tobacco Use  Smoking Status Former Smoker  . Packs/day: 2.00  . Years: 40.00  . Pack years: 80.00  . Last attempt to quit: 10/03/2010  . Years since quitting: 6.3  Smokeless Tobacco Never Used  Tobacco Comment   No longer using the Vapor    Goals Met:  Independence with exercise equipment Exercise tolerated well Personal goals reviewed No report of cardiac concerns or symptoms Strength training completed today  Goals Unmet:  Not Applicable  Comments: Check out: 12:00   Dr. Kate Sable is Medical Director for [place] and Pulmonary Rehab.

## 2017-02-14 ENCOUNTER — Other Ambulatory Visit (HOSPITAL_COMMUNITY): Payer: Self-pay | Admitting: Adult Health

## 2017-02-14 ENCOUNTER — Encounter (HOSPITAL_COMMUNITY)
Admission: RE | Admit: 2017-02-14 | Discharge: 2017-02-14 | Disposition: A | Discharge status: Home / Self Care | Payer: BLUE CROSS/BLUE SHIELD | Source: Ambulatory Visit | Attending: Cardiology | Admitting: Cardiology

## 2017-02-14 DIAGNOSIS — Z951 Presence of aortocoronary bypass graft: Secondary | ICD-10-CM | POA: Diagnosis not present

## 2017-02-14 NOTE — Progress Notes (Signed)
Daily Session Note  Patient Details  Name: Emily Rocha MRN: [id number] Date of Birth: [birth year] Referring Provider:     CARDIAC REHAB PHASE II ORIENTATION from 01/31/2017 in [place]  Referring Provider  Dr. Percival Spanish      Encounter Date: 02/14/2017  Check In: Session Check In - 02/14/17 1112      Check-In   Location  [facility]    Staff Present  Suzanne Boron, BS, EP, Exercise Physiologist;Debra Wynetta Emery, RN, BSN    Supervising physician immediately available to respond to emergencies  See telemetry face sheet for immediately available MD    Medication changes reported      No    Fall or balance concerns reported     No    Tobacco Cessation  No Change    Warm-up and Cool-down  Performed as group-led instruction    Resistance Training Performed  Yes    VAD Patient?  No      Pain Assessment   Currently in Pain?  No/denies    Pain Score  0-No pain    Multiple Pain Sites  No       Capillary Blood Glucose: No results found for this or any previous visit (from the past 24 hour(s)).    Social History   Tobacco Use  Smoking Status Former Smoker  . Packs/day: 2.00  . Years: 40.00  . Pack years: 80.00  . Last attempt to quit: 10/03/2010  . Years since quitting: 6.3  Smokeless Tobacco Never Used  Tobacco Comment   No longer using the Vapor    Goals Met:  Independence with exercise equipment Exercise tolerated well No report of cardiac concerns or symptoms Strength training completed today  Goals Unmet:  Not Applicable  Comments: Check out 1200   Dr. Kate Sable is Medical Director for [place] and Pulmonary Rehab.

## 2017-02-16 ENCOUNTER — Encounter (HOSPITAL_COMMUNITY)
Admission: RE | Admit: 2017-02-16 | Discharge: 2017-02-16 | Disposition: A | Discharge status: Home / Self Care | Payer: BLUE CROSS/BLUE SHIELD | Source: Ambulatory Visit | Attending: Cardiology | Admitting: Cardiology

## 2017-02-16 DIAGNOSIS — J449 Chronic obstructive pulmonary disease, unspecified: Secondary | ICD-10-CM | POA: Insufficient documentation

## 2017-02-16 DIAGNOSIS — Z7951 Long term (current) use of inhaled steroids: Secondary | ICD-10-CM | POA: Diagnosis not present

## 2017-02-16 DIAGNOSIS — Z7902 Long term (current) use of antithrombotics/antiplatelets: Secondary | ICD-10-CM | POA: Insufficient documentation

## 2017-02-16 DIAGNOSIS — Z951 Presence of aortocoronary bypass graft: Secondary | ICD-10-CM | POA: Insufficient documentation

## 2017-02-16 DIAGNOSIS — Z87891 Personal history of nicotine dependence: Secondary | ICD-10-CM | POA: Diagnosis not present

## 2017-02-16 DIAGNOSIS — K219 Gastro-esophageal reflux disease without esophagitis: Secondary | ICD-10-CM | POA: Diagnosis not present

## 2017-02-16 DIAGNOSIS — Z79899 Other long term (current) drug therapy: Secondary | ICD-10-CM | POA: Insufficient documentation

## 2017-02-16 NOTE — Progress Notes (Signed)
Daily Session Note  Patient Details  Name: Emily Rocha MRN: [id number] Date of Birth: 1956/01/19 Referring Provider:     CARDIAC REHAB PHASE II ORIENTATION from 01/31/2017 in [place]  Referring Provider  Dr. Percival Spanish      Encounter Date: 02/16/2017  Check In: Session Check In - 02/16/17 1111      Check-In   Location  [facility]    Staff Present  Suzanne Boron, BS, EP, Exercise Physiologist;Debra Wynetta Emery, RN, BSN    Supervising physician immediately available to respond to emergencies  See telemetry face sheet for immediately available MD    Medication changes reported      No    Fall or balance concerns reported     No    Warm-up and Cool-down  Performed as group-led instruction    Resistance Training Performed  Yes    VAD Patient?  No      Pain Assessment   Currently in Pain?  No/denies    Pain Score  0-No pain    Multiple Pain Sites  No       Capillary Blood Glucose: No results found for this or any previous visit (from the past 24 hour(s)).  Exercise Prescription Changes - 02/16/17 1000      Response to Exercise   Blood Pressure (Admit)  118/70    Blood Pressure (Exercise)  142/68    Blood Pressure (Exit)  102/62    Heart Rate (Admit)  90 bpm    Heart Rate (Exercise)  105 bpm    Heart Rate (Exit)  94 bpm    Rating of Perceived Exertion (Exercise)  10    Duration  Progress to 30 minutes of  aerobic without signs/symptoms of physical distress    Intensity  THRR New 117-130-144      Progression   Progression  Continue to progress workloads to maintain intensity without signs/symptoms of physical distress.      Resistance Training   Training Prescription  Yes    Weight  2    Reps  10-15      Treadmill   MPH  1.5    Grade  0    Minutes  15    METs  2.1      NuStep   Level  2    SPM  85    Minutes  20    METs  1.8      Home Exercise Plan   Plans to continue exercise at  Home (comment)    Frequency  Add 2  additional days to program exercise sessions.    Initial Home Exercises Provided  02/09/17       Social History   Tobacco Use  Smoking Status Former Smoker  . Packs/day: 2.00  . Years: 40.00  . Pack years: 80.00  . Last attempt to quit: 10/03/2010  . Years since quitting: 6.3  Smokeless Tobacco Never Used  Tobacco Comment   No longer using the Vapor    Goals Met:  Independence with exercise equipment Exercise tolerated well No report of cardiac concerns or symptoms Strength training completed today  Goals Unmet:  Not Applicable  Comments: Check out 1200   Dr. Kate Sable is Medical Director for [place] and Pulmonary Rehab.

## 2017-02-18 ENCOUNTER — Encounter (HOSPITAL_COMMUNITY)
Admission: RE | Admit: 2017-02-18 | Discharge: 2017-02-18 | Disposition: A | Discharge status: Home / Self Care | Payer: BLUE CROSS/BLUE SHIELD | Source: Ambulatory Visit | Attending: Cardiology | Admitting: Cardiology

## 2017-02-18 DIAGNOSIS — Z951 Presence of aortocoronary bypass graft: Secondary | ICD-10-CM | POA: Diagnosis not present

## 2017-02-18 NOTE — Progress Notes (Signed)
Daily Session Note  Patient Details  Name: Emily Rocha MRN: [id number] Date of Birth: 07-14-1955 Referring Provider:     CARDIAC REHAB PHASE II ORIENTATION from 01/31/2017 in [place]  Referring Provider  Dr. Percival Spanish      Encounter Date: 02/18/2017  Check In: Session Check In - 02/18/17 1100      Check-In   Location  [facility]    Staff Present  Suzanne Boron, BS, EP, Exercise Physiologist;Lindi Abram Wynetta Emery, RN, BSN    Supervising physician immediately available to respond to emergencies  See telemetry face sheet for immediately available MD    Medication changes reported      No    Fall or balance concerns reported     No    Warm-up and Cool-down  Performed as group-led instruction    Resistance Training Performed  Yes    VAD Patient?  No      Pain Assessment   Currently in Pain?  No/denies    Pain Score  0-No pain    Multiple Pain Sites  No       Capillary Blood Glucose: No results found for this or any previous visit (from the past 24 hour(s)).    Social History   Tobacco Use  Smoking Status Former Smoker  . Packs/day: 2.00  . Years: 40.00  . Pack years: 80.00  . Last attempt to quit: 10/03/2010  . Years since quitting: 6.3  Smokeless Tobacco Never Used  Tobacco Comment   No longer using the Vapor    Goals Met:  Independence with exercise equipment Exercise tolerated well No report of cardiac concerns or symptoms Strength training completed today  Goals Unmet:  Not Applicable  Comments: Check out 1200.   Dr. Kate Sable is Medical Director for [facility] Cardiac and Pulmonary Rehab.

## 2017-02-21 ENCOUNTER — Other Ambulatory Visit: Payer: Self-pay

## 2017-02-21 ENCOUNTER — Ambulatory Visit (HOSPITAL_COMMUNITY)
Admission: RE | Admit: 2017-02-21 | Discharge: 2017-02-21 | Disposition: A | Discharge status: Home / Self Care | Payer: BLUE CROSS/BLUE SHIELD | Source: Ambulatory Visit | Attending: Internal Medicine | Admitting: Internal Medicine

## 2017-02-21 ENCOUNTER — Encounter (HOSPITAL_COMMUNITY): Payer: Self-pay | Admitting: Internal Medicine

## 2017-02-21 ENCOUNTER — Encounter (HOSPITAL_COMMUNITY)
Admission: RE | Admit: 2017-02-21 | Discharge: 2017-02-21 | Disposition: A | Discharge status: Home / Self Care | Payer: BLUE CROSS/BLUE SHIELD | Source: Ambulatory Visit | Attending: Cardiology | Admitting: Cardiology

## 2017-02-21 VITALS — BP 117/68 | HR 84 | Wt 175.8 lb

## 2017-02-21 DIAGNOSIS — I739 Peripheral vascular disease, unspecified: Secondary | ICD-10-CM | POA: Diagnosis not present

## 2017-02-21 DIAGNOSIS — Z7982 Long term (current) use of aspirin: Secondary | ICD-10-CM | POA: Insufficient documentation

## 2017-02-21 DIAGNOSIS — Z7902 Long term (current) use of antithrombotics/antiplatelets: Secondary | ICD-10-CM | POA: Diagnosis not present

## 2017-02-21 DIAGNOSIS — Z87891 Personal history of nicotine dependence: Secondary | ICD-10-CM | POA: Diagnosis not present

## 2017-02-21 DIAGNOSIS — I6529 Occlusion and stenosis of unspecified carotid artery: Secondary | ICD-10-CM | POA: Insufficient documentation

## 2017-02-21 DIAGNOSIS — I350 Nonrheumatic aortic (valve) stenosis: Secondary | ICD-10-CM | POA: Diagnosis not present

## 2017-02-21 DIAGNOSIS — I2510 Atherosclerotic heart disease of native coronary artery without angina pectoris: Secondary | ICD-10-CM | POA: Insufficient documentation

## 2017-02-21 DIAGNOSIS — Z7951 Long term (current) use of inhaled steroids: Secondary | ICD-10-CM | POA: Diagnosis not present

## 2017-02-21 DIAGNOSIS — I480 Paroxysmal atrial fibrillation: Secondary | ICD-10-CM | POA: Diagnosis not present

## 2017-02-21 DIAGNOSIS — Z79899 Other long term (current) drug therapy: Secondary | ICD-10-CM | POA: Diagnosis not present

## 2017-02-21 DIAGNOSIS — K219 Gastro-esophageal reflux disease without esophagitis: Secondary | ICD-10-CM | POA: Diagnosis not present

## 2017-02-21 DIAGNOSIS — I5022 Chronic systolic (congestive) heart failure: Secondary | ICD-10-CM | POA: Diagnosis not present

## 2017-02-21 DIAGNOSIS — J449 Chronic obstructive pulmonary disease, unspecified: Secondary | ICD-10-CM | POA: Diagnosis not present

## 2017-02-21 DIAGNOSIS — J9611 Chronic respiratory failure with hypoxia: Secondary | ICD-10-CM | POA: Diagnosis not present

## 2017-02-21 DIAGNOSIS — Z951 Presence of aortocoronary bypass graft: Secondary | ICD-10-CM

## 2017-02-21 LAB — BASIC METABOLIC PANEL
ANION GAP: 8 (ref 5–15)
BUN: 13 mg/dL (ref 6–20)
CHLORIDE: 99 mmol/L — AB (ref 101–111)
CO2: 31 mmol/L (ref 22–32)
Calcium: 9.5 mg/dL (ref 8.9–10.3)
Creatinine, Ser: 0.64 mg/dL (ref 0.44–1.00)
GFR calc non Af Amer: >60 mL/min (ref >60)
GLUCOSE: 96 mg/dL (ref 65–99)
Potassium: 3.4 mmol/L — ABNORMAL LOW (ref 3.5–5.1)
Sodium: 138 mmol/L (ref 135–145)

## 2017-02-21 MED ORDER — TORSEMIDE 20 MG PO TABS: 40.0000 mg | ORAL_TABLET | Freq: Every day | ORAL | 1 refills | Status: DC

## 2017-02-21 NOTE — Progress Notes (Signed)
Daily Session Note  Patient Details  Name: Emily Rocha MRN: [id number] Date of Birth: [birth year] Referring Provider:     CARDIAC REHAB PHASE II ORIENTATION from 01/31/2017 in [place]  Referring Provider  Dr. Percival Spanish      Encounter Date: 02/21/2017  Check In: Session Check In - 02/21/17 1100      Check-In   Location  [facility]    Staff Present  Suzanne Boron, BS, EP, Exercise Physiologist;Maxamilian Amadon Wynetta Emery, RN, BSN    Supervising physician immediately available to respond to emergencies  See telemetry face sheet for immediately available MD    Medication changes reported      No    Fall or balance concerns reported     No    Warm-up and Cool-down  Performed as group-led instruction    Resistance Training Performed  Yes    VAD Patient?  No      Pain Assessment   Currently in Pain?  No/denies    Pain Score  0-No pain    Multiple Pain Sites  No       Capillary Blood Glucose: No results found for this or any previous visit (from the past 24 hour(s)).    Social History   Tobacco Use  Smoking Status Former Smoker  . Packs/day: 2.00  . Years: 40.00  . Pack years: 80.00  . Last attempt to quit: 10/03/2010  . Years since quitting: 6.3  Smokeless Tobacco Never Used  Tobacco Comment   No longer using the Vapor    Goals Met:  Independence with exercise equipment Exercise tolerated well No report of cardiac concerns or symptoms Strength training completed today  Goals Unmet:  Not Applicable  Comments: Check out 1200.   Dr. Kate Sable is Medical Director for [facility] Cardiac and Pulmonary Rehab.

## 2017-02-21 NOTE — Addendum Note (Signed)
Encounter addended by: Scarlette Calico, RN on: 02/21/2017 2:49 PM  Actions taken: Sign clinical note, Medication long-term status modified, Pharmacy for encounter modified, Diagnosis association updated, Order list changed

## 2017-02-21 NOTE — Patient Instructions (Signed)
Decrease Torsemide to 40 mg (2 tabs) daily, can take extra as needed  Stop Potassium  Stop Nexium  Labs today  Labs in 2 weeks  You have been referred to follow up with Dr Harl Bowie with an echocardiogram in 2 months

## 2017-02-21 NOTE — Progress Notes (Signed)
ADVANCED HEART FAILURE CLINIC NOTE  PCP: Dr Woody Seller  Primary Cardiologist: Dr Percival Spanish HF MD: Dr Haroldine Laws   HPI: Emily Rocha is a 62 year old with a h/o COPD, asthma, PAD, carotid stenosis, COPD, GERD, CAD, CABGx 3, and PAF.   Admitted 10/28 through 12/26/2016. Presented with chest pain-->NSTEMI (trop 2.0) and underwent CABG x3 on 12/14/2016. Post operatively she developed A fib but converted to NSR with amiodarone. HF d/c meds --> Torsemide 40 mg twice a day, Spiro 12.5 mg daily, Losartan 12.5 mg daily, and amio 200 mg twice a day.  Returns for f/u. Feels great. Has completed PT. Denies CP or SOB. Says she is breathing better than she has in year. Weight stable at 175. BP stable. Off O2. Has had slight wound infection at sternotomy due to retained stitch and she is packing it.   Echo 12/20/16 LVEF 45-50%, Mild/Mod AS   ROS: All systems negative except as listed in HPI, PMH and Problem List.  SH:  Social History   Socioeconomic History  . Marital status: Divorced    Spouse name: Not on file  . Number of children: Not on file  . Years of education: Not on file  . Highest education level: Not on file  Social Needs  . Financial resource strain: Not on file  . Food insecurity - worry: Not on file  . Food insecurity - inability: Not on file  . Transportation needs - medical: Not on file  . Transportation needs - non-medical: Not on file  Occupational History  . Not on file  Tobacco Use  . Smoking status: Former Smoker    Packs/day: 2.00    Years: 40.00    Pack years: 80.00    Last attempt to quit: 10/03/2010    Years since quitting: 6.3  . Smokeless tobacco: Never Used  . Tobacco comment: No longer using the Vapor  Substance and Sexual Activity  . Alcohol use: No    Alcohol/week: 0.0 oz  . Drug use: No  . Sexual activity: Not on file  Other Topics Concern  . Not on file  Social History Narrative  . Not on file    FH:  Family History  Problem Relation Age of Onset  .  Heart attack Mother   . Cancer Mother   . Diabetes Mother   . Heart disease Mother   . Heart attack Father   . Heart disease Father     Past Medical History:  Diagnosis Date  . Asthma   . COPD (chronic obstructive pulmonary disease) ([place])   . GERD (gastroesophageal reflux disease)   . Shortness of breath dyspnea     Current Outpatient Medications  Medication Sig Dispense Refill  . ALPRAZolam (XANAX) 0.5 MG tablet Take 0.5 mg by mouth 2 (two) times daily as needed for anxiety.     Marland Kitchen aspirin 81 MG chewable tablet Chew 81 mg by mouth daily.    Marland Kitchen atorvastatin (LIPITOR) 40 MG tablet Take 1 tablet (40 mg total) daily by mouth. 30 tablet 1  . Cholecalciferol (VITAMIN D) 2000 units CAPS Take 2,000 Units by mouth daily.    . clopidogrel (PLAVIX) 75 MG tablet Take 1 tablet (75 mg total) daily by mouth. 30 tablet 1  . esomeprazole (NEXIUM) 40 MG capsule Take 1 capsule (40 mg total) by mouth 2 (two) times daily before a meal. 60 capsule 2  . metoprolol succinate (TOPROL-XL) 50 MG 24 hr tablet Take 50 mg by mouth daily.  2  . mometasone-formoterol (DULERA) 100-5 MCG/ACT AERO Inhale 2 puffs 2 (two) times daily into the lungs. 1 Inhaler 1  . Multiple Vitamin (MULTIVITAMIN WITH MINERALS) TABS tablet Take 1 tablet by mouth daily.    . potassium chloride SA (K-DUR,KLOR-CON) 20 MEQ tablet Take 1 tablet (20 mEq total) 2 (two) times daily by mouth. Do not take if torsemide is discontinued 60 tablet 1  . spironolactone (ALDACTONE) 25 MG tablet Take 0.5 tablets (12.5 mg total) at bedtime by mouth. 30 tablet 1  . SYMBICORT 160-4.5 MCG/ACT inhaler Inhale 1 Package into the lungs 2 (two) times daily. 2 puffs twice daily  4  . torsemide (DEMADEX) 20 MG tablet Take 2 tablets (40 mg total) 2 (two) times daily by mouth. 120 tablet 1  . VENTOLIN HFA 108 (90 BASE) MCG/ACT inhaler Inhale 2 puffs into the lungs every 6 (six) hours as needed for shortness of breath.   0   No current facility-administered medications  for this encounter.     Vitals:   02/21/17 1409  BP: 117/68  Pulse: 84  SpO2: 99%  Weight: 175 lb 12 oz (79.7 kg)    PHYSICAL EXAM: General:  Well appearing. No resp difficulty HEENT: normal Neck: supple. no JVD. Carotids 2+ bilat; no bruits. No lymphadenopathy or thryomegaly appreciated. Cor: Sternal wound bandadged. Healing well . PMI nondisplaced. Regular rate & rhythm. 2/6 AS Lungs: clear with decreased BS Abdomen: soft, nontender, nondistended. No hepatosplenomegaly. No bruits or masses. Good bowel sounds. Extremities: no cyanosis, clubbing, rash, edema Neuro: alert & orientedx3, cranial nerves grossly intact. moves all 4 extremities w/o difficulty. Affect pleasant   ASSESSMENT & PLAN:  1. CAD -S/P CABG 12/14/2016  - On asa, statin & b-blocker - Refer to [facility] CR Maintenance Program  2. Chronic Systolic Heart Failure - 09/21/5782 ECHO 45-50%  - Much improved NYHA II -Continue Toprol  -Continue spiro 12.5 mg daily.  -Cut torsemide to 40 daily and stop potassium. Can take extra as needed.  -BMET today.  3. PAF post-op - Maintaining NSR. Amiodarone off - Has not tolerated DOAC in past 4. PAD - ASX - On statin and plavix.  5. COPD - Chronic Hypoxic  Respiratory Failure  - has quit smoking. Off O2 6. Aortic stenosis - mild. Continue to follow    Refer back to [facility] in [place] - Dr. Harl Bowie. With echo in 2 months.  Glori Bickers, MD  2:25 PM

## 2017-02-23 ENCOUNTER — Ambulatory Visit (INDEPENDENT_AMBULATORY_CARE_PROVIDER_SITE_OTHER): Payer: Self-pay | Admitting: Cardiothoracic Surgery

## 2017-02-23 ENCOUNTER — Encounter: Payer: Self-pay | Admitting: Cardiothoracic Surgery

## 2017-02-23 ENCOUNTER — Encounter (HOSPITAL_COMMUNITY)
Admission: RE | Admit: 2017-02-23 | Discharge: 2017-02-23 | Disposition: A | Discharge status: Home / Self Care | Payer: BLUE CROSS/BLUE SHIELD | Source: Ambulatory Visit | Attending: Cardiology | Admitting: Cardiology

## 2017-02-23 VITALS — BP 130/78 | HR 83 | Resp 20 | Ht 64.0 in | Wt 175.0 lb

## 2017-02-23 DIAGNOSIS — I2510 Atherosclerotic heart disease of native coronary artery without angina pectoris: Secondary | ICD-10-CM

## 2017-02-23 DIAGNOSIS — S21109A Unspecified open wound of unspecified front wall of thorax without penetration into thoracic cavity, initial encounter: Secondary | ICD-10-CM

## 2017-02-23 DIAGNOSIS — Z951 Presence of aortocoronary bypass graft: Secondary | ICD-10-CM | POA: Diagnosis not present

## 2017-02-23 NOTE — Progress Notes (Signed)
Daily Session Note  Patient Details  Name: Emily Rocha MRN: [id number] Date of Birth: 06/02/1955 Referring Provider:     CARDIAC REHAB PHASE II ORIENTATION from 01/31/2017 in [place] CARDIAC RE[facility]ATION  Referring Provider  Dr. Hochrein      Encounter Date: 02/23/2017  Check In: Session Check In - 02/23/17 1539      Check-In   Location  [facility]    Staff Present   , RN, BSN;Gregory Cowan, BS, EP, Exercise Physiologist    Supervising physician immediately available to respond to emergencies  See telemetry face sheet for immediately available MD    Medication changes reported      No    Fall or balance concerns reported     No    Warm-up and Cool-down  Performed as group-led instruction    Resistance Training Performed  Yes    VAD Patient?  No      Pain Assessment   Currently in Pain?  No/denies    Pain Score  0-No pain    Multiple Pain Sites  No       Capillary Blood Glucose: No results found for this or any previous visit (from the past 24 hour(s)).    Social History   Tobacco Use  Smoking Status Former Smoker  . Packs/day: 2.00  . Years: 40.00  . Pack years: 80.00  . Last attempt to quit: 10/03/2010  . Years since quitting: 6.3  Smokeless Tobacco Never Used  Tobacco Comment   No longer using the Vapor    Goals Met:  Independence with exercise equipment Exercise tolerated well No report of cardiac concerns or symptoms Strength training completed today  Goals Unmet:  Not Applicable  Comments: Check out 1645.   Dr. Suresh Koneswaran is Medical Director for [place] [facility] and Pulmonary Re[facility]. 

## 2017-02-23 NOTE — Progress Notes (Signed)
PCP is Glenda Chroman, MD Referring Provider is Glenda Chroman, MD  Chief Complaint  Patient presents with  . Routine Post Op    1 month f/u, sternal wound check s/p CABG    HPI: Wound check after CABG 2 months ago Patient developed a superficial suture abscess in the mid sternal area. The patient's husband is packing this with saline wet-to-dry daily. Otherwise she feels well with improved exercise tolerance, no more angina. Progressing well with cardiac rehabilitation.  Past Medical History:  Diagnosis Date  . Asthma   . COPD (chronic obstructive pulmonary disease) ([place])   . GERD (gastroesophageal reflux disease)   . Shortness of breath dyspnea     Past Surgical History:  Procedure Laterality Date  . CHOLECYSTECTOMY    . COLONOSCOPY N/A 10/03/2014   Procedure: COLONOSCOPY;  Surgeon: Rogene Houston, MD;  [facility];  Service: Endoscopy;  Laterality: N/A;  730  . CORONARY ARTERY BYPASS GRAFT N/A 12/14/2016   Procedure: CORONARY ARTERY BYPASS GRAFTING (CABG) x three , using left internal mammary artery and right leg greater saphenous vein harvested endoscopically;  Surgeon: Ivin Poot, MD;  Location: [place];  Service: Open Heart Surgery;  Laterality: N/A;  . ENDARTERECTOMY Right 12/06/2014   Procedure: ENDARTERECTOMY CAROTid;  Surgeon: Elam Dutch, MD;  Location: [facility] OR;  Service: Vascular;  Laterality: Right;  . ESOPHAGOGASTRODUODENOSCOPY N/A 12/03/2016   Procedure: ESOPHAGOGASTRODUODENOSCOPY (EGD);  Surgeon: Rogene Houston, MD;  [facility];  Service: Endoscopy;  Laterality: N/A;  7:30  . LEFT HEART CATH AND CORONARY ANGIOGRAPHY N/A 12/14/2016   Procedure: LEFT HEART CATH AND CORONARY ANGIOGRAPHY;  Surgeon: Burnell Blanks, MD;  Location: [place] CV LAB;  Service: Cardiovascular;  Laterality: N/A;  . PERIPHERAL VASCULAR CATHETERIZATION N/A 12/05/2014   Procedure:  Carotid Arch Angiography;  Surgeon: Conrad Coryell, MD;  Location: [place] CV LAB;  Service: Cardiovascular;  Laterality: N/A;  . TEE WITHOUT CARDIOVERSION N/A 12/14/2016   Procedure: TRANSESOPHAGEAL ECHOCARDIOGRAM (TEE);  Surgeon: Prescott Gum, Collier Salina, MD;  Location: [place];  Service: Open Heart Surgery;  Laterality: N/A;    Family History  Problem Relation Age of Onset  . Heart attack Mother   . Cancer Mother   . Diabetes Mother   . Heart disease Mother   . Heart attack Father   . Heart disease Father     Social History Social History   Tobacco Use  . Smoking status: Former Smoker    Packs/day: 2.00    Years: 40.00    Pack years: 80.00    Last attempt to quit: 10/03/2010    Years since quitting: 6.3  . Smokeless tobacco: Never Used  . Tobacco comment: No longer using the Vapor  Substance Use Topics  . Alcohol use: No    Alcohol/week: 0.0 oz  . Drug use: No    Current Outpatient Medications  Medication Sig Dispense Refill  . ALPRAZolam (XANAX) 0.5 MG tablet Take 0.5 mg by mouth 2 (two) times daily as needed for anxiety.     Marland Kitchen aspirin 81 MG chewable tablet Chew 81 mg by mouth daily.    Marland Kitchen atorvastatin (LIPITOR) 40 MG tablet Take 1 tablet (40 mg total) daily by mouth. 30 tablet 1  . Cholecalciferol (VITAMIN D) 2000 units CAPS Take 2,000 Units by mouth daily.    . clopidogrel (PLAVIX) 75 MG tablet Take 1 tablet (75 mg total) daily by mouth. 30 tablet 1  .  metoprolol succinate (TOPROL-XL) 50 MG 24 hr tablet Take 50 mg by mouth daily.  2  . mometasone-formoterol (DULERA) 100-5 MCG/ACT AERO Inhale 2 puffs 2 (two) times daily into the lungs. 1 Inhaler 1  . Multiple Vitamin (MULTIVITAMIN WITH MINERALS) TABS tablet Take 1 tablet by mouth daily.    Marland Kitchen spironolactone (ALDACTONE) 25 MG tablet Take 0.5 tablets (12.5 mg total) at bedtime by mouth. 30 tablet 1  . SYMBICORT 160-4.5 MCG/ACT inhaler Inhale 1 Package into the lungs 2 (two) times daily. 2 puffs twice daily  4  . torsemide (DEMADEX) 20 MG tablet Take 2 tablets (40 mg total) by mouth daily. 120  tablet 1  . VENTOLIN HFA 108 (90 BASE) MCG/ACT inhaler Inhale 2 puffs into the lungs every 6 (six) hours as needed for shortness of breath.   0   No current facility-administered medications for this visit.     No Known Allergies  Review of Systems  No fever minimal serosanguineous drainage from sternal incision  BP 130/78   Pulse 83   Resp 20   Ht 5\' 4"  (1.626 m)   Wt 175 lb (79.4 kg)   SpO2 96% Comment: RA  BMI 30.04 kg/m  Physical Exam Mid sternal area with skin separation and a 2 cm defect in the subcutaneous tissue with intact presternal fascia Lungs clear Heart rate regular Sternum stable No peripheral edema  Diagnostic Tests: None  Impression: Suture granuloma-abscess of the mid sternal superficial soft tissue after CABG  Plan: Continue wet-to-dry dressing with 1 inch Nu Gauze Keflex 500 mg by mouth 3 times a day 10 days Return for office wound check in 14 days Len Childs, MD [facility] [phone]

## 2017-02-25 ENCOUNTER — Encounter (HOSPITAL_COMMUNITY): Payer: BLUE CROSS/BLUE SHIELD

## 2017-02-28 ENCOUNTER — Encounter (HOSPITAL_COMMUNITY): Payer: BLUE CROSS/BLUE SHIELD

## 2017-03-02 ENCOUNTER — Encounter (HOSPITAL_COMMUNITY): Payer: BLUE CROSS/BLUE SHIELD

## 2017-03-03 NOTE — Progress Notes (Signed)
Cardiac Individual Treatment Plan  Patient Details  Name: Emily Rocha MRN: [id number] Date of Birth: 11-28-1955 Referring Provider:     [facility] from 01/31/2017 in [place]  Referring Provider  Dr. Percival Spanish      Initial Encounter Date:    CARDIAC REHAB PHASE II ORIENTATION from 01/31/2017 in [place]  Date  01/31/17  Referring Provider  Dr. Percival Spanish      Visit Diagnosis: S/P CABG x 3  Patient's Home Medications on Admission:  Current Outpatient Medications:  .  ALPRAZolam (XANAX) 0.5 MG tablet, Take 0.5 mg by mouth 2 (two) times daily as needed for anxiety. , Disp: , Rfl:  .  aspirin 81 MG chewable tablet, Chew 81 mg by mouth daily., Disp: , Rfl:  .  atorvastatin (LIPITOR) 40 MG tablet, Take 1 tablet (40 mg total) daily by mouth., Disp: 30 tablet, Rfl: 1 .  Cholecalciferol (VITAMIN D) 2000 units CAPS, Take 2,000 Units by mouth daily., Disp: , Rfl:  .  clopidogrel (PLAVIX) 75 MG tablet, Take 1 tablet (75 mg total) daily by mouth., Disp: 30 tablet, Rfl: 1 .  metoprolol succinate (TOPROL-XL) 50 MG 24 hr tablet, Take 50 mg by mouth daily., Disp: , Rfl: 2 .  mometasone-formoterol (DULERA) 100-5 MCG/ACT AERO, Inhale 2 puffs 2 (two) times daily into the lungs., Disp: 1 Inhaler, Rfl: 1 .  Multiple Vitamin (MULTIVITAMIN WITH MINERALS) TABS tablet, Take 1 tablet by mouth daily., Disp: , Rfl:  .  spironolactone (ALDACTONE) 25 MG tablet, Take 0.5 tablets (12.5 mg total) at bedtime by mouth., Disp: 30 tablet, Rfl: 1 .  SYMBICORT 160-4.5 MCG/ACT inhaler, Inhale 1 Package into the lungs 2 (two) times daily. 2 puffs twice daily, Disp: , Rfl: 4 .  torsemide (DEMADEX) 20 MG tablet, Take 2 tablets (40 mg total) by mouth daily., Disp: 120 tablet, Rfl: 1 .  VENTOLIN HFA 108 (90 BASE) MCG/ACT inhaler, Inhale 2 puffs into the lungs every 6 (six) hours as needed for shortness of breath. , Disp: , Rfl: 0  Past Medical  History: Past Medical History:  Diagnosis Date  . Asthma   . COPD (chronic obstructive pulmonary disease) ([place])   . GERD (gastroesophageal reflux disease)   . Shortness of breath dyspnea     Tobacco Use: Social History   Tobacco Use  Smoking Status Former Smoker  . Packs/day: 2.00  . Years: 40.00  . Pack years: 80.00  . Last attempt to quit: 10/03/2010  . Years since quitting: 6.4  Smokeless Tobacco Never Used  Tobacco Comment   No longer using the Vapor    Labs: Recent Review Flowsheet Data    Labs for ITP Cardiac and Pulmonary Rehab Latest Ref Rng & Units 12/23/2016 12/24/2016 12/25/2016 12/26/2016 12/27/2016   Cholestrol 0 - 200 mg/dL - - - - -   LDLCALC 0 - 99 mg/dL - - - - -   HDL >40 mg/dL - - - - -   Trlycerides <150 mg/dL - - - - -   Hemoglobin A1c 4.8 - 5.6 % - - - - -   PHART 7.350 - 7.450 - - - - -   PCO2ART 32.0 - 48.0 mmHg - - - - -   HCO3 20.0 - 28.0 mmol/L - - - - -   TCO2 22 - 32 mmol/L - - - - -   O2SAT % 56.5 57.4 57.2 99.5 99.7      Capillary Blood  Glucose: Lab Results  Component Value Date   GLUCAP 105 (H) 12/24/2016   GLUCAP 103 (H) 12/23/2016   GLUCAP 84 12/23/2016   GLUCAP 125 (H) 12/23/2016   GLUCAP 119 (H) 12/23/2016     Exercise Target Goals:    Exercise Program Goal: Individual exercise prescription set using results from initial 6 min walk test and THRR while considering  patient's activity barriers and safety.   Exercise Prescription Goal: Starting with aerobic activity 30 plus minutes a day, 3 days per week for initial exercise prescription. Provide home exercise prescription and guidelines that participant acknowledges understanding prior to discharge.  Activity Barriers & Risk Stratification: Activity Barriers & Cardiac Risk Stratification - 01/31/17 1012      Activity Barriers & Cardiac Risk Stratification   Activity Barriers  Shortness of Breath;Chest Pain/Angina    Cardiac Risk Stratification  High       6 Minute  Walk: 6 Minute Walk    Row Name 01/31/17 1011         6 Minute Walk   Phase  Initial     Distance  1050 feet     Distance % Change  0 %     Distance Feet Change  0 ft     Walk Time  6 minutes     # of Rest Breaks  0     MPH  1.98     METS  2.52     RPE  9     Perceived Dyspnea   11     VO2 Peak  9.15     Symptoms  No     Resting HR  85 bpm     Resting BP  110/70     Resting Oxygen Saturation   94 %     Exercise Oxygen Saturation  during 6 min walk  86 %     Max Ex. HR  98 bpm     Max Ex. BP  116/72     2 Minute Post BP  112/68        Oxygen Initial Assessment: Oxygen Initial Assessment - 01/31/17 1016      Home Oxygen   Home Oxygen Device  None    Sleep Oxygen Prescription  None    Home Exercise Oxygen Prescription  None      Initial 6 min Walk   Oxygen Used  None      Program Oxygen Prescription   Program Oxygen Prescription  None       Oxygen Re-Evaluation:   Oxygen Discharge (Final Oxygen Re-Evaluation):   Initial Exercise Prescription: Initial Exercise Prescription - 01/31/17 1000      Date of Initial Exercise RX and Referring Provider   Date  01/31/17    Referring Provider  Dr. Percival Spanish      Treadmill   MPH  1.3    Grade  0    Minutes  15    METs  1.9      NuStep   Level  1    SPM  64    Minutes  20    METs  1.6      Prescription Details   Frequency (times per week)  3    Duration  Progress to 30 minutes of continuous aerobic without signs/symptoms of physical distress      Intensity   THRR 40-80% of Max Heartrate  [phone]    Ratings of Perceived Exertion  11-13    Perceived Dyspnea  0-4  Progression   Progression  Continue progressive overload as per policy without signs/symptoms or physical distress.      Resistance Training   Training Prescription  Yes    Weight  1    Reps  10-15       Perform Capillary Blood Glucose checks as needed.  Exercise Prescription Changes:  Exercise Prescription Changes    Row Name  02/09/17 1400 02/16/17 1000           Response to Exercise   Blood Pressure (Admit)  -  118/70      Blood Pressure (Exercise)  -  142/68      Blood Pressure (Exit)  -  102/62      Heart Rate (Admit)  -  90 bpm      Heart Rate (Exercise)  -  105 bpm      Heart Rate (Exit)  -  94 bpm      Rating of Perceived Exertion (Exercise)  -  10      Duration  -  Progress to 30 minutes of  aerobic without signs/symptoms of physical distress      Intensity  -  THRR New [id number]        Progression   Progression  -  Continue to progress workloads to maintain intensity without signs/symptoms of physical distress.        Resistance Training   Training Prescription  Yes  Yes      Weight  1  2      Reps  10-15  10-15        Treadmill   MPH  1.3  1.5      Grade  0  0      Minutes  15  15      METs  1.9  2.1        NuStep   Level  1  2      SPM  64  85      Minutes  20  20      METs  1.6  1.8        Home Exercise Plan   Plans to continue exercise at  Home (comment)  Home (comment)      Frequency  Add 2 additional days to program exercise sessions.  Add 2 additional days to program exercise sessions.      Initial Home Exercises Provided  02/09/17  02/09/17         Exercise Comments:  Exercise Comments    Row Name 02/09/17 1407 02/16/17 1027         Exercise Comments  Patient received her take home exercise plan today 02/09/2017. THR was addressed as were safety tips for being active when not in the CR program. Patient demonstrated an understanding and was encouraged to ask any future questions as they arise.   Patient is doing well in CR. She has increased her speed on the treadmill to 1.5 MPH. Patient has also increased her SPMs on the Nustep. She states that she now has more energy to do tasks throughout the day.          Exercise Goals and Review:  Exercise Goals    Row Name 01/31/17 1014             Exercise Goals   Increase Physical Activity  Yes       Intervention   Provide advice, education, support and counseling about physical activity/exercise needs.;Develop an individualized exercise prescription for  aerobic and resistive training based on initial evaluation findings, risk stratification, comorbidities and participant's personal goals.       Expected Outcomes  Achievement of increased cardiorespiratory fitness and enhanced flexibility, muscular endurance and strength shown through measurements of functional capacity and personal statement of participant.       Increase Strength and Stamina  Yes       Intervention  Provide advice, education, support and counseling about physical activity/exercise needs.;Develop an individualized exercise prescription for aerobic and resistive training based on initial evaluation findings, risk stratification, comorbidities and participant's personal goals.       Expected Outcomes  Achievement of increased cardiorespiratory fitness and enhanced flexibility, muscular endurance and strength shown through measurements of functional capacity and personal statement of participant.       Able to understand and use rate of perceived exertion (RPE) scale  Yes       Intervention  Provide education and explanation on how to use RPE scale       Expected Outcomes  Short Term: Able to use RPE daily in rehab to express subjective intensity level;Long Term:  Able to use RPE to guide intensity level when exercising independently       Able to understand and use Dyspnea scale  Yes       Intervention  Provide education and explanation on how to use Dyspnea scale       Expected Outcomes  Short Term: Able to use Dyspnea scale daily in rehab to express subjective sense of shortness of breath during exertion;Long Term: Able to use Dyspnea scale to guide intensity level when exercising independently       Knowledge and understanding of Target Heart Rate Range (THRR)  Yes       Intervention  Provide education and explanation of THRR including how the  numbers were predicted and where they are located for reference       Expected Outcomes  Short Term: Able to state/look up THRR       Able to check pulse independently  Yes       Intervention  Provide education and demonstration on how to check pulse in carotid and radial arteries.;Review the importance of being able to check your own pulse for safety during independent exercise       Expected Outcomes  Long Term: Able to check pulse independently and accurately;Short Term: Able to explain why pulse checking is important during independent exercise       Understanding of Exercise Prescription  Yes       Intervention  Provide education, explanation, and written materials on patient's individual exercise prescription       Expected Outcomes  Long Term: Able to explain home exercise prescription to exercise independently;Short Term: Able to explain program exercise prescription          Exercise Goals Re-Evaluation :    Discharge Exercise Prescription (Final Exercise Prescription Changes): Exercise Prescription Changes - 02/16/17 1000      Response to Exercise   Blood Pressure (Admit)  118/70    Blood Pressure (Exercise)  142/68    Blood Pressure (Exit)  102/62    Heart Rate (Admit)  90 bpm    Heart Rate (Exercise)  105 bpm    Heart Rate (Exit)  94 bpm    Rating of Perceived Exertion (Exercise)  10    Duration  Progress to 30 minutes of  aerobic without signs/symptoms of physical distress    Intensity  THRR New [phone]  Progression   Progression  Continue to progress workloads to maintain intensity without signs/symptoms of physical distress.      Resistance Training   Training Prescription  Yes    Weight  2    Reps  10-15      Treadmill   MPH  1.5    Grade  0    Minutes  15    METs  2.1      NuStep   Level  2    SPM  85    Minutes  20    METs  1.8      Home Exercise Plan   Plans to continue exercise at  Home (comment)    Frequency  Add 2 additional days to  program exercise sessions.    Initial Home Exercises Provided  02/09/17       Nutrition:  Target Goals: Understanding of nutrition guidelines, daily intake of sodium 1500mg , cholesterol 200mg , calories 30% from fat and 7% or less from saturated fats, daily to have 5 or more servings of fruits and vegetables.  Biometrics: Pre Biometrics - 01/31/17 1014      Pre Biometrics   Height  5\' 5"  (1.651 m)    Weight  172 lb 11.2 oz (78.3 kg)    Waist Circumference  39 inches    Hip Circumference  41.5 inches    Waist to Hip Ratio  0.94 %    BMI (Calculated)  28.74    Triceps Skinfold  17 mm    % Body Fat  37.8 %    Grip Strength  41.2 kg    Flexibility  18.67 in    Single Leg Stand  3 seconds        Nutrition Therapy Plan and Nutrition Goals: Nutrition Therapy & Goals - 02/17/17 1643      Personal Nutrition Goals   Nutrition Goal  For heart healthy choices add >50% of whole grains, make half their plate fruits and vegetables. Discuss the difference between starchy vegetables and leafy greens, and how leafy vegetables provide fiber, helps maintain healthy weight, helps control blood glucose, and lowers cholesterol.  Discuss purchasing fresh or frozen vegetable to reduce sodium and not to add grease, fat or sugar. Consume <18oz of red meat per week. Consume lean cuts of meats and very little of meats high in sodium and nitrates such as pork and lunch meats. Discussed portion control for all food groups      Intervention Plan   Intervention  Nutrition handout(s) given to patient.    Expected Outcomes  Short Term Goal: Understand basic principles of dietary content, such as calories, fat, sodium, cholesterol and nutrients.       Nutrition Assessments: Nutrition Assessments - 01/31/17 1018      MEDFICTS Scores   Pre Score  68       Nutrition Goals Re-Evaluation:   Nutrition Goals Discharge (Final Nutrition Goals Re-Evaluation):   Psychosocial: Target Goals: Acknowledge  presence or absence of significant depression and/or stress, maximize coping skills, provide positive support system. Participant is able to verbalize types and ability to use techniques and skills needed for reducing stress and depression.  Initial Review & Psychosocial Screening: Initial Psych Review & Screening - 01/31/17 1031      Initial Review   Current issues with  None Identified      Family Dynamics   Good Support System?  Yes      Barriers   Psychosocial barriers to participate in program  There are no identifiable barriers or psychosocial needs.      Screening Interventions   Interventions  Encouraged to exercise       Quality of Life Scores: Quality of Life - 01/31/17 1016      Quality of Life Scores   Health/Function Pre  25.17 %    Socioeconomic Pre  29.14 %    Psych/Spiritual Pre  29.14 %    Family Pre  26.4 %    GLOBAL Pre  26.99 %      Scores of 19 and below usually indicate a poorer quality of life in these areas.  A difference of  2-3 points is a clinically meaningful difference.  A difference of 2-3 points in the total score of the Quality of Life Index has been associated with significant improvement in overall quality of life, self-image, physical symptoms, and general health in studies assessing change in quality of life.  PHQ-9: Recent Review Flowsheet Data    Depression screen [facility] 2/9 01/31/2017   Decreased Interest 0   Down, Depressed, Hopeless 0   PHQ - 2 Score 0   Altered sleeping 0   Tired, decreased energy 2   Change in appetite 0   Feeling bad or failure about yourself  0   Trouble concentrating 0   Moving slowly or fidgety/restless 0   Suicidal thoughts 0   PHQ-9 Score 2   Difficult doing work/chores Somewhat difficult     Interpretation of Total Score  Total Score Depression Severity:  1-4 = Minimal depression, 5-9 = Mild depression, 10-14 = Moderate depression, 15-19 = Moderately severe depression, 20-27 = Severe depression    Psychosocial Evaluation and Intervention: Psychosocial Evaluation - 01/31/17 1032      Psychosocial Evaluation & Interventions   Interventions  Encouraged to exercise with the program and follow exercise prescription    Continue Psychosocial Services   No Follow up required       Psychosocial Re-Evaluation:   Psychosocial Discharge (Final Psychosocial Re-Evaluation):   Vocational Rehabilitation: Provide vocational rehab assistance to qualifying candidates.   Vocational Rehab Evaluation & Intervention: Vocational Rehab - 01/31/17 1005      Initial Vocational Rehab Evaluation & Intervention   Assessment shows need for Vocational Rehabilitation  No       Education: Education Goals: Education classes will be provided on a weekly basis, covering required topics. Participant will state understanding/return demonstration of topics presented.  Learning Barriers/Preferences: Learning Barriers/Preferences - 01/31/17 1009      Learning Barriers/Preferences   Learning Barriers  Hearing    Learning Preferences  Pictoral;Individual Instruction;Group Instruction       Education Topics: Hypertension, Hypertension Reduction -Define heart disease and high blood pressure. Discus how high blood pressure affects the body and ways to reduce high blood pressure.   Exercise and Your Heart -Discuss why it is important to exercise, the FITT principles of exercise, normal and abnormal responses to exercise, and how to exercise safely.   Angina -Discuss definition of angina, causes of angina, treatment of angina, and how to decrease risk of having angina.   Cardiac Medications -Review what the following cardiac medications are used for, how they affect the body, and side effects that may occur when taking the medications.  Medications include Aspirin, Beta blockers, calcium channel blockers, ACE Inhibitors, angiotensin receptor blockers, diuretics, digoxin, and  antihyperlipidemics.   Congestive Heart Failure -Discuss the definition of CHF, how to live with CHF, the signs and symptoms of CHF, and how  keep track of weight and sodium intake.   Heart Disease and Intimacy -Discus the effect sexual activity has on the heart, how changes occur during intimacy as we age, and safety during sexual activity.   Smoking Cessation / COPD -Discuss different methods to quit smoking, the health benefits of quitting smoking, and the definition of COPD.   CARDIAC REHAB PHASE II EXERCISE from 02/23/2017 in [place]  Date  02/02/17  Educator  DC  Instruction Review Code  2- Demonstrated Understanding      Nutrition I: Fats -Discuss the types of cholesterol, what cholesterol does to the heart, and how cholesterol levels can be controlled.   CARDIAC REHAB PHASE II EXERCISE from 02/23/2017 in [place]  Date  02/09/17  Educator  [place]  Instruction Review Code  2- Demonstrated Understanding      Nutrition II: Labels -Discuss the different components of food labels and how to read food label   [place] from 02/23/2017 in [place]  Date  02/16/17  Educator  DC  Instruction Review Code  2- Demonstrated Understanding      Heart Parts and Heart Disease -Discuss the anatomy of the heart, the pathway of blood circulation through the heart, and these are affected by heart disease.   CARDIAC REHAB PHASE II EXERCISE from 02/23/2017 in [place]  Date  02/23/17  Educator  [place]  Instruction Review Code  2- Demonstrated Understanding      Stress I: Signs and Symptoms -Discuss the causes of stress, how stress may lead to anxiety and depression, and ways to limit stress.   Stress II: Relaxation -Discuss different types of relaxation techniques to limit stress.   Warning Signs of Stroke / TIA -Discuss definition of a stroke, what the signs and symptoms  are of a stroke, and how to identify when someone is having stroke.   Knowledge Questionnaire Score: Knowledge Questionnaire Score - 01/31/17 1004      Knowledge Questionnaire Score   Pre Score  20/24       Core Components/Risk Factors/Patient Goals at Admission: Personal Goals and Risk Factors at Admission - 01/31/17 1019      Core Components/Risk Factors/Patient Goals on Admission    Weight Management  Yes    Admit Weight  172 lb 11.2 oz (78.3 kg)    Goal Weight: Short Term  167 lb 11.2 oz (76.1 kg)    Goal Weight: Long Term  162 lb 11.2 oz (73.8 kg)    Expected Outcomes  Short Term: Continue to assess and modify interventions until short term weight is achieved;Long Term: Adherence to nutrition and physical activity/exercise program aimed toward attainment of established weight goal    Personal Goal Other  Yes    Personal Goal  Have more energy, to gain the habit of exercise, lose 20lbs    Intervention  Attend CR 3x week and to supplement with home exercise 2 x week.     Expected Outcomes  Reach goals.        Core Components/Risk Factors/Patient Goals Review:    Core Components/Risk Factors/Patient Goals at Discharge (Final Review):    ITP Comments: ITP Comments    Row Name 01/31/17 1314 03/03/17 1558         ITP Comments  Patient new to program. Plans to start Wednesday 02/02/17.  Patient stopped attending after completing 10 sessions due to insurance. She is joining our forever fit maintenance program.  MD notified.          Comments: Patient stopped coming to Cardiac Rehab on 02/23/2017 due to insurance. She is joining our forever fit maintenance program. Doctor will be informed.

## 2017-03-03 NOTE — Progress Notes (Signed)
Discharge Progress Report  Patient Details  Name: Emily Rocha MRN: [id number] Date of Birth: 01-05-1956 Referring Provider:     [facility] from 01/31/2017 in [place]  Referring Provider  Dr. Percival Spanish       Number of Visits: 10  Reason for Discharge:  Early Exit:  Insurance  Smoking History:  Social History   Tobacco Use  Smoking Status Former Smoker  . Packs/day: 2.00  . Years: 40.00  . Pack years: 80.00  . Last attempt to quit: 10/03/2010  . Years since quitting: 6.4  Smokeless Tobacco Never Used  Tobacco Comment   No longer using the Vapor    Diagnosis:  S/P CABG x 3  ADL UCSD:   Initial Exercise Prescription: Initial Exercise Prescription - 01/31/17 1000      Date of Initial Exercise RX and Referring Provider   Date  01/31/17    Referring Provider  Dr. Percival Spanish      Treadmill   MPH  1.3    Grade  0    Minutes  15    METs  1.9      NuStep   Level  1    SPM  64    Minutes  20    METs  1.6      Prescription Details   Frequency (times per week)  3    Duration  Progress to 30 minutes of continuous aerobic without signs/symptoms of physical distress      Intensity   THRR 40-80% of Max Heartrate  [phone]    Ratings of Perceived Exertion  11-13    Perceived Dyspnea  0-4      Progression   Progression  Continue progressive overload as per policy without signs/symptoms or physical distress.      Resistance Training   Training Prescription  Yes    Weight  1    Reps  10-15       Discharge Exercise Prescription (Final Exercise Prescription Changes): Exercise Prescription Changes - 02/16/17 1000      Response to Exercise   Blood Pressure (Admit)  118/70    Blood Pressure (Exercise)  142/68    Blood Pressure (Exit)  102/62    Heart Rate (Admit)  90 bpm    Heart Rate (Exercise)  105 bpm    Heart Rate (Exit)  94 bpm    Rating of Perceived Exertion (Exercise)  10    Duration  Progress to 30  minutes of  aerobic without signs/symptoms of physical distress    Intensity  THRR New 117-130-144      Progression   Progression  Continue to progress workloads to maintain intensity without signs/symptoms of physical distress.      Resistance Training   Training Prescription  Yes    Weight  2    Reps  10-15      Treadmill   MPH  1.5    Grade  0    Minutes  15    METs  2.1      NuStep   Level  2    SPM  85    Minutes  20    METs  1.8      Home Exercise Plan   Plans to continue exercise at  Home (comment)    Frequency  Add 2 additional days to program exercise sessions.    Initial Home Exercises Provided  02/09/17       Functional Capacity: 6 Minute  Walk    Row Name 01/31/17 1011         6 Minute Walk   Phase  Initial     Distance  1050 feet     Distance % Change  0 %     Distance Feet Change  0 ft     Walk Time  6 minutes     # of Rest Breaks  0     MPH  1.98     METS  2.52     RPE  9     Perceived Dyspnea   11     VO2 Peak  9.15     Symptoms  No     Resting HR  85 bpm     Resting BP  110/70     Resting Oxygen Saturation   94 %     Exercise Oxygen Saturation  during 6 min walk  86 %     Max Ex. HR  98 bpm     Max Ex. BP  116/72     2 Minute Post BP  112/68        Psychological, QOL, Others - Outcomes: PHQ 2/9: Depression screen PHQ 2/9 01/31/2017  Decreased Interest 0  Down, Depressed, Hopeless 0  PHQ - 2 Score 0  Altered sleeping 0  Tired, decreased energy 2  Change in appetite 0  Feeling bad or failure about yourself  0  Trouble concentrating 0  Moving slowly or fidgety/restless 0  Suicidal thoughts 0  PHQ-9 Score 2  Difficult doing work/chores Somewhat difficult    Quality of Life: Quality of Life - 01/31/17 1016      Quality of Life Scores   Health/Function Pre  25.17 %    Socioeconomic Pre  29.14 %    Psych/Spiritual Pre  29.14 %    Family Pre  26.4 %    GLOBAL Pre  26.99 %       Personal Goals: Goals established at  orientation with interventions provided to work toward goal. Personal Goals and Risk Factors at Admission - 01/31/17 1019      Core Components/Risk Factors/Patient Goals on Admission    Weight Management  Yes    Admit Weight  172 lb 11.2 oz (78.3 kg)    Goal Weight: Short Term  167 lb 11.2 oz (76.1 kg)    Goal Weight: Long Term  162 lb 11.2 oz (73.8 kg)    Expected Outcomes  Short Term: Continue to assess and modify interventions until short term weight is achieved;Long Term: Adherence to nutrition and physical activity/exercise program aimed toward attainment of established weight goal    Personal Goal Other  Yes    Personal Goal  Have more energy, to gain the habit of exercise, lose 20lbs    Intervention  Attend CR 3x week and to supplement with home exercise 2 x week.     Expected Outcomes  Reach goals.         Personal Goals Discharge:   Exercise Goals and Review: Exercise Goals    Row Name 01/31/17 1014             Exercise Goals   Increase Physical Activity  Yes       Intervention  Provide advice, education, support and counseling about physical activity/exercise needs.;Develop an individualized exercise prescription for aerobic and resistive training based on initial evaluation findings, risk stratification, comorbidities and participant's personal goals.       Expected Outcomes  Achievement  of increased cardiorespiratory fitness and enhanced flexibility, muscular endurance and strength shown through measurements of functional capacity and personal statement of participant.       Increase Strength and Stamina  Yes       Intervention  Provide advice, education, support and counseling about physical activity/exercise needs.;Develop an individualized exercise prescription for aerobic and resistive training based on initial evaluation findings, risk stratification, comorbidities and participant's personal goals.       Expected Outcomes  Achievement of increased cardiorespiratory  fitness and enhanced flexibility, muscular endurance and strength shown through measurements of functional capacity and personal statement of participant.       Able to understand and use rate of perceived exertion (RPE) scale  Yes       Intervention  Provide education and explanation on how to use RPE scale       Expected Outcomes  Short Term: Able to use RPE daily in rehab to express subjective intensity level;Long Term:  Able to use RPE to guide intensity level when exercising independently       Able to understand and use Dyspnea scale  Yes       Intervention  Provide education and explanation on how to use Dyspnea scale       Expected Outcomes  Short Term: Able to use Dyspnea scale daily in rehab to express subjective sense of shortness of breath during exertion;Long Term: Able to use Dyspnea scale to guide intensity level when exercising independently       Knowledge and understanding of Target Heart Rate Range (THRR)  Yes       Intervention  Provide education and explanation of THRR including how the numbers were predicted and where they are located for reference       Expected Outcomes  Short Term: Able to state/look up THRR       Able to check pulse independently  Yes       Intervention  Provide education and demonstration on how to check pulse in carotid and radial arteries.;Review the importance of being able to check your own pulse for safety during independent exercise       Expected Outcomes  Long Term: Able to check pulse independently and accurately;Short Term: Able to explain why pulse checking is important during independent exercise       Understanding of Exercise Prescription  Yes       Intervention  Provide education, explanation, and written materials on patient's individual exercise prescription       Expected Outcomes  Long Term: Able to explain home exercise prescription to exercise independently;Short Term: Able to explain program exercise prescription          Nutrition  & Weight - Outcomes: Pre Biometrics - 01/31/17 1014      Pre Biometrics   Height  5\' 5"  (1.651 m)    Weight  172 lb 11.2 oz (78.3 kg)    Waist Circumference  39 inches    Hip Circumference  41.5 inches    Waist to Hip Ratio  0.94 %    BMI (Calculated)  28.74    Triceps Skinfold  17 mm    % Body Fat  37.8 %    Grip Strength  41.2 kg    Flexibility  18.67 in    Single Leg Stand  3 seconds        Nutrition: Nutrition Therapy & Goals - 02/17/17 1643      Personal Nutrition Goals   Nutrition Goal  For heart healthy choices add >50% of whole grains, make half their plate fruits and vegetables. Discuss the difference between starchy vegetables and leafy greens, and how leafy vegetables provide fiber, helps maintain healthy weight, helps control blood glucose, and lowers cholesterol.  Discuss purchasing fresh or frozen vegetable to reduce sodium and not to add grease, fat or sugar. Consume <18oz of red meat per week. Consume lean cuts of meats and very little of meats high in sodium and nitrates such as pork and lunch meats. Discussed portion control for all food groups      Intervention Plan   Intervention  Nutrition handout(s) given to patient.    Expected Outcomes  Short Term Goal: Understand basic principles of dietary content, such as calories, fat, sodium, cholesterol and nutrients.       Nutrition Discharge: Nutrition Assessments - 01/31/17 1018      MEDFICTS Scores   Pre Score  68       Education Questionnaire Score: Knowledge Questionnaire Score - 01/31/17 1004      Knowledge Questionnaire Score   Pre Score  20/24

## 2017-03-04 ENCOUNTER — Encounter (HOSPITAL_COMMUNITY): Payer: BLUE CROSS/BLUE SHIELD

## 2017-03-07 ENCOUNTER — Encounter (HOSPITAL_COMMUNITY): Payer: BLUE CROSS/BLUE SHIELD

## 2017-03-08 ENCOUNTER — Other Ambulatory Visit: Payer: Self-pay | Admitting: Internal Medicine

## 2017-03-09 ENCOUNTER — Encounter: Payer: Self-pay | Admitting: Cardiothoracic Surgery

## 2017-03-09 ENCOUNTER — Ambulatory Visit (INDEPENDENT_AMBULATORY_CARE_PROVIDER_SITE_OTHER): Payer: Self-pay | Admitting: Cardiothoracic Surgery

## 2017-03-09 ENCOUNTER — Encounter (HOSPITAL_COMMUNITY): Payer: BLUE CROSS/BLUE SHIELD

## 2017-03-09 VITALS — BP 120/75 | HR 80 | Resp 20 | Ht 64.0 in | Wt 175.0 lb

## 2017-03-09 DIAGNOSIS — Z951 Presence of aortocoronary bypass graft: Secondary | ICD-10-CM

## 2017-03-09 DIAGNOSIS — S21109A Unspecified open wound of unspecified front wall of thorax without penetration into thoracic cavity, initial encounter: Secondary | ICD-10-CM

## 2017-03-09 DIAGNOSIS — I2510 Atherosclerotic heart disease of native coronary artery without angina pectoris: Secondary | ICD-10-CM

## 2017-03-09 LAB — BASIC METABOLIC PANEL
BUN / CREAT RATIO: 19 (ref 12–28)
BUN: 16 mg/dL (ref 8–27)
CO2: 29 mmol/L (ref 20–29)
Calcium: 10.1 mg/dL (ref 8.7–10.3)
Chloride: 98 mmol/L (ref 96–106)
Creatinine, Ser: 0.85 mg/dL (ref 0.57–1.00)
GFR, EST AFRICAN AMERICAN: 84 mL/min/{1.73_m2} (ref >59)
GFR, EST NON AFRICAN AMERICAN: 73 mL/min/{1.73_m2} (ref >59)
Glucose: 90 mg/dL (ref 65–99)
POTASSIUM: 4.3 mmol/L (ref 3.5–5.2)
Sodium: 143 mmol/L (ref 134–144)

## 2017-03-09 LAB — SPECIMEN STATUS REPORT

## 2017-03-09 NOTE — Progress Notes (Signed)
PCP is Glenda Chroman, MD Referring Provider is Burnell Blanks*  Chief Complaint  Patient presents with  . Routine Post Op    2 week fu wound check    HPI: Patient presents for scheduled wound check. She has a superficial breakdown in the upper sternal incision to the subcutaneous layer being packed with a quarter inch Nu Gauze wet-to-dry daily. She is finished a course of oral Keflex.  I examined the wound and change the packing personally. It is about 1.5 cm deep, clean with granulation tissue. Topical Silver nitrate applied and then was packed with a Nu Gauze wet-to-dry. She'll continue current wound care at home. I sent a culture to see if further antibiotics are needed.  Overall she is much improved as an participating in cardiac rehabilitation. Better energy than she has had in years. No recurrent angina. Past Medical History:  Diagnosis Date  . Asthma   . COPD (chronic obstructive pulmonary disease) ([place])   . GERD (gastroesophageal reflux disease)   . Shortness of breath dyspnea     Past Surgical History:  Procedure Laterality Date  . CHOLECYSTECTOMY    . COLONOSCOPY N/A 10/03/2014   Procedure: COLONOSCOPY;  Surgeon: Rogene Houston, MD;  [facility];  Service: Endoscopy;  Laterality: N/A;  730  . CORONARY ARTERY BYPASS GRAFT N/A 12/14/2016   Procedure: CORONARY ARTERY BYPASS GRAFTING (CABG) x three , using left internal mammary artery and right leg greater saphenous vein harvested endoscopically;  Surgeon: Ivin Poot, MD;  Location: [facility];  Service: Open Heart Surgery;  Laterality: N/A;  . ENDARTERECTOMY Right 12/06/2014   Procedure: ENDARTERECTOMY CAROTid;  Surgeon: Elam Dutch, MD;  Location: [facility] OR;  Service: Vascular;  Laterality: Right;  . ESOPHAGOGASTRODUODENOSCOPY N/A 12/03/2016   Procedure: ESOPHAGOGASTRODUODENOSCOPY (EGD);  Surgeon: Rogene Houston, MD;  [facility];  Service: Endoscopy;  Laterality: N/A;  7:30  . LEFT HEART  CATH AND CORONARY ANGIOGRAPHY N/A 12/14/2016   Procedure: LEFT HEART CATH AND CORONARY ANGIOGRAPHY;  Surgeon: Burnell Blanks, MD;  Location: [place] [facility];  Service: Cardiovascular;  Laterality: N/A;  . PERIPHERAL VASCULAR CATHETERIZATION N/A 12/05/2014   Procedure:  Carotid Arch Angiography;  Surgeon: Conrad Gonzales, MD;  Location: [place] [facility];  Service: Cardiovascular;  Laterality: N/A;  . TEE WITHOUT CARDIOVERSION N/A 12/14/2016   Procedure: TRANSESOPHAGEAL ECHOCARDIOGRAM (TEE);  Surgeon: Prescott Gum, Collier Salina, MD;  Location: [place];  Service: Open Heart Surgery;  Laterality: N/A;    Family History  Problem Relation Age of Onset  . Heart attack Mother   . Cancer Mother   . Diabetes Mother   . Heart disease Mother   . Heart attack Father   . Heart disease Father     Social History Social History   Tobacco Use  . Smoking status: Former Smoker    Packs/day: 2.00    Years: 40.00    Pack years: 80.00    Last attempt to quit: 10/03/2010    Years since quitting: 6.4  . Smokeless tobacco: Never Used  . Tobacco comment: No longer using the Vapor  Substance Use Topics  . Alcohol use: No    Alcohol/week: 0.0 oz  . Drug use: No    Current Outpatient Medications  Medication Sig Dispense Refill  . ALPRAZolam (XANAX) 0.5 MG tablet Take 0.5 mg by mouth 2 (two) times daily as needed for anxiety.     Marland Kitchen aspirin 81 MG chewable tablet Chew  81 mg by mouth daily.    Marland Kitchen atorvastatin (LIPITOR) 40 MG tablet Take 1 tablet (40 mg total) daily by mouth. 30 tablet 1  . Cholecalciferol (VITAMIN D) 2000 units CAPS Take 2,000 Units by mouth daily.    . clopidogrel (PLAVIX) 75 MG tablet Take 1 tablet (75 mg total) daily by mouth. 30 tablet 1  . metoprolol succinate (TOPROL-XL) 50 MG 24 hr tablet Take 50 mg by mouth daily.  2  . mometasone-formoterol (DULERA) 100-5 MCG/ACT AERO Inhale 2 puffs 2 (two) times daily into the lungs. 1 Inhaler 1  . Multiple Vitamin (MULTIVITAMIN WITH MINERALS) TABS  tablet Take 1 tablet by mouth daily.    Marland Kitchen spironolactone (ALDACTONE) 25 MG tablet Take 0.5 tablets (12.5 mg total) at bedtime by mouth. 30 tablet 1  . SYMBICORT 160-4.5 MCG/ACT inhaler Inhale 1 Package into the lungs 2 (two) times daily. 2 puffs twice daily  4  . torsemide (DEMADEX) 20 MG tablet Take 2 tablets (40 mg total) by mouth daily. 120 tablet 1  . VENTOLIN HFA 108 (90 BASE) MCG/ACT inhaler Inhale 2 puffs into the lungs every 6 (six) hours as needed for shortness of breath.   0   No current facility-administered medications for this visit.     No Known Allergies  Review of Systems  No fever Sleeping better Weight stable  BP 120/75   Pulse 80   Resp 20   Ht 5\' 4"  (1.626 m)   Wt 175 lb (79.4 kg)   SpO2 96% Comment: RA  BMI 30.04 kg/m  Physical Exam      Exam    General- alert and comfortable    Neck- no JVD, no cervical adenopathy palpable, no carotid bruit   Lungs- clear without rales, wheezes   Cor- regular rate and rhythm, no murmur , gallop   Abdomen- soft, non-tender   Extremities - warm, non-tender, minimal edema   Neuro- oriented, appropriate, no focal weakness   Diagnostic Tests: None  Impression:  Superficial sternal wound infection healing by secondary intent Continue daily wet-to-dry dressing changes Follow-up wound culture results to see if further antibiotics are needed Plan:  return for wound check in one week.  Len Childs, MD [facility] [phone]

## 2017-03-10 ENCOUNTER — Other Ambulatory Visit: Payer: Self-pay

## 2017-03-10 DIAGNOSIS — T8149XA Infection following a procedure, other surgical site, initial encounter: Secondary | ICD-10-CM

## 2017-03-10 DIAGNOSIS — I2510 Atherosclerotic heart disease of native coronary artery without angina pectoris: Secondary | ICD-10-CM

## 2017-03-11 ENCOUNTER — Other Ambulatory Visit (HOSPITAL_COMMUNITY): Payer: Self-pay | Admitting: Internal Medicine

## 2017-03-11 ENCOUNTER — Encounter (HOSPITAL_COMMUNITY): Payer: BLUE CROSS/BLUE SHIELD

## 2017-03-14 ENCOUNTER — Encounter (HOSPITAL_COMMUNITY): Payer: BLUE CROSS/BLUE SHIELD

## 2017-03-15 ENCOUNTER — Telehealth: Payer: Self-pay

## 2017-03-15 DIAGNOSIS — L03313 Cellulitis of chest wall: Secondary | ICD-10-CM

## 2017-03-15 MED ORDER — CEPHALEXIN 500 MG PO CAPS: 500.0000 mg | ORAL_CAPSULE | Freq: Three times a day (TID) | ORAL | 0 refills | Status: DC

## 2017-03-15 NOTE — Telephone Encounter (Signed)
RX for Keflex 500 mg TID x 10 days sent to [facility]. Patient is aware

## 2017-03-16 ENCOUNTER — Encounter (HOSPITAL_COMMUNITY): Payer: BLUE CROSS/BLUE SHIELD

## 2017-03-18 ENCOUNTER — Encounter (HOSPITAL_COMMUNITY): Payer: BLUE CROSS/BLUE SHIELD

## 2017-03-21 ENCOUNTER — Encounter (HOSPITAL_COMMUNITY): Payer: BLUE CROSS/BLUE SHIELD

## 2017-03-23 ENCOUNTER — Encounter: Payer: Self-pay | Admitting: Cardiothoracic Surgery

## 2017-03-23 ENCOUNTER — Ambulatory Visit: Payer: BLUE CROSS/BLUE SHIELD | Admitting: Cardiothoracic Surgery

## 2017-03-23 ENCOUNTER — Encounter (HOSPITAL_COMMUNITY): Payer: BLUE CROSS/BLUE SHIELD

## 2017-03-23 VITALS — BP 134/80 | HR 104 | Resp 20 | Ht 64.0 in | Wt 174.0 lb

## 2017-03-23 DIAGNOSIS — S21109A Unspecified open wound of unspecified front wall of thorax without penetration into thoracic cavity, initial encounter: Secondary | ICD-10-CM | POA: Diagnosis not present

## 2017-03-23 DIAGNOSIS — I2510 Atherosclerotic heart disease of native coronary artery without angina pectoris: Secondary | ICD-10-CM

## 2017-03-23 DIAGNOSIS — Z951 Presence of aortocoronary bypass graft: Secondary | ICD-10-CM

## 2017-03-23 NOTE — Progress Notes (Signed)
PCP is Glenda Chroman, MD Referring Provider is Burnell Blanks*  Chief Complaint  Patient presents with  . Routine Post Op    2 week f/u sternal wound check    HPI: Patient returns for wound check Superficial sternal wound infection upper sternum Wound cultures taken in last office visit positive for staph M SSA-sensitive to Keflex Wound with minimal drainage Granulating in slowly I've asked the patient's husband to pack the wound twice a day to increase granulation Patient is working full-time without systemic symptoms  Past Medical History:  Diagnosis Date  . Asthma   . COPD (chronic obstructive pulmonary disease) ([place])   . GERD (gastroesophageal reflux disease)   . Shortness of breath dyspnea     Past Surgical History:  Procedure Laterality Date  . CHOLECYSTECTOMY    . COLONOSCOPY N/A 10/03/2014   Procedure: COLONOSCOPY;  Surgeon: Rogene Houston, MD;  [facility];  Service: Endoscopy;  Laterality: N/A;  730  . CORONARY ARTERY BYPASS GRAFT N/A 12/14/2016   Procedure: CORONARY ARTERY BYPASS GRAFTING (CABG) x three , using left internal mammary artery and right leg greater saphenous vein harvested endoscopically;  Surgeon: Ivin Poot, MD;  Location: [place];  Service: Open Heart Surgery;  Laterality: N/A;  . ENDARTERECTOMY Right 12/06/2014   Procedure: ENDARTERECTOMY CAROTid;  Surgeon: Elam Dutch, MD;  Location: [facility] OR;  Service: Vascular;  Laterality: Right;  . ESOPHAGOGASTRODUODENOSCOPY N/A 12/03/2016   Procedure: ESOPHAGOGASTRODUODENOSCOPY (EGD);  Surgeon: Rogene Houston, MD;  [facility];  Service: Endoscopy;  Laterality: N/A;  7:30  . LEFT HEART CATH AND CORONARY ANGIOGRAPHY N/A 12/14/2016   Procedure: LEFT HEART CATH AND CORONARY ANGIOGRAPHY;  Surgeon: Burnell Blanks, MD;  Location: [place] [facility];  Service: Cardiovascular;  Laterality: N/A;  . PERIPHERAL VASCULAR CATHETERIZATION N/A 12/05/2014   Procedure:  Carotid  Arch Angiography;  Surgeon: Conrad Rocklake, MD;  Location: [place] [facility];  Service: Cardiovascular;  Laterality: N/A;  . TEE WITHOUT CARDIOVERSION N/A 12/14/2016   Procedure: TRANSESOPHAGEAL ECHOCARDIOGRAM (TEE);  Surgeon: Prescott Gum, Collier Salina, MD;  Location: [place];  Service: Open Heart Surgery;  Laterality: N/A;    Family History  Problem Relation Age of Onset  . Heart attack Mother   . Cancer Mother   . Diabetes Mother   . Heart disease Mother   . Heart attack Father   . Heart disease Father     Social History Social History   Tobacco Use  . Smoking status: Former Smoker    Packs/day: 2.00    Years: 40.00    Pack years: 80.00    Last attempt to quit: 10/03/2010    Years since quitting: 6.4  . Smokeless tobacco: Never Used  . Tobacco comment: No longer using the Vapor  Substance Use Topics  . Alcohol use: No    Alcohol/week: 0.0 oz  . Drug use: No    Current Outpatient Medications  Medication Sig Dispense Refill  . ALPRAZolam (XANAX) 0.5 MG tablet Take 0.5 mg by mouth 2 (two) times daily as needed for anxiety.     Marland Kitchen aspirin 81 MG chewable tablet Chew 81 mg by mouth daily.    Marland Kitchen atorvastatin (LIPITOR) 40 MG tablet Take 1 tablet (40 mg total) daily by mouth. 30 tablet 1  . cephALEXin (KEFLEX) 500 MG capsule Take 1 capsule (500 mg total) by mouth 3 (three) times daily. 30 capsule 0  . Cholecalciferol (VITAMIN D) 2000 units CAPS  Take 2,000 Units by mouth daily.    . clopidogrel (PLAVIX) 75 MG tablet Take 1 tablet (75 mg total) daily by mouth. 30 tablet 1  . metoprolol succinate (TOPROL-XL) 50 MG 24 hr tablet Take 50 mg by mouth daily.  2  . mometasone-formoterol (DULERA) 100-5 MCG/ACT AERO Inhale 2 puffs 2 (two) times daily into the lungs. 1 Inhaler 1  . Multiple Vitamin (MULTIVITAMIN WITH MINERALS) TABS tablet Take 1 tablet by mouth daily.    Marland Kitchen spironolactone (ALDACTONE) 25 MG tablet Take 0.5 tablets (12.5 mg total) at bedtime by mouth. 30 tablet 1  . SYMBICORT 160-4.5 MCG/ACT  inhaler Inhale 1 Package into the lungs 2 (two) times daily. 2 puffs twice daily  4  . torsemide (DEMADEX) 20 MG tablet Take 2 tablets (40 mg total) by mouth daily. 120 tablet 1  . VENTOLIN HFA 108 (90 BASE) MCG/ACT inhaler Inhale 2 puffs into the lungs every 6 (six) hours as needed for shortness of breath.   0   No current facility-administered medications for this visit.     No Known Allergies  Review of Systems  Currently she has an upper respiratory infection with cough and nasal congestion and sinus congestion No fever  BP 134/80   Pulse (!) 104   Resp 20   Ht 5\' 4"  (1.626 m)   Wt 174 lb (78.9 kg)   SpO2 93% Comment: RA  BMI 29.87 kg/m  Physical Exam Alert and oriented Neck without adenopathy or JVD Lungs clear Heart rate regular Sternum stable Wound change personally with new quarter-inch Nu Gauze Wound is proximally two thirds of an inch deep and subcutaneous fat  Diagnostic Tests:  On Impression: Official sternal infection  Continue Keflex for sensitive MSSA, continue twice a day dressing changes  Plan: Wound check in 2 weeks  Len Childs, MD [facility] [phone]

## 2017-03-25 ENCOUNTER — Encounter (HOSPITAL_COMMUNITY): Payer: BLUE CROSS/BLUE SHIELD

## 2017-03-28 ENCOUNTER — Encounter (HOSPITAL_COMMUNITY): Payer: BLUE CROSS/BLUE SHIELD

## 2017-03-30 ENCOUNTER — Encounter (HOSPITAL_COMMUNITY): Payer: BLUE CROSS/BLUE SHIELD

## 2017-04-01 ENCOUNTER — Encounter (HOSPITAL_COMMUNITY): Payer: BLUE CROSS/BLUE SHIELD

## 2017-04-04 ENCOUNTER — Encounter (HOSPITAL_COMMUNITY): Payer: BLUE CROSS/BLUE SHIELD

## 2017-04-06 ENCOUNTER — Encounter (HOSPITAL_COMMUNITY): Payer: BLUE CROSS/BLUE SHIELD

## 2017-04-06 ENCOUNTER — Encounter: Payer: BLUE CROSS/BLUE SHIELD | Admitting: Cardiothoracic Surgery

## 2017-04-08 ENCOUNTER — Encounter: Payer: BLUE CROSS/BLUE SHIELD | Admitting: Cardiothoracic Surgery

## 2017-04-08 ENCOUNTER — Encounter (HOSPITAL_COMMUNITY): Payer: BLUE CROSS/BLUE SHIELD

## 2017-04-11 ENCOUNTER — Encounter (HOSPITAL_COMMUNITY): Payer: BLUE CROSS/BLUE SHIELD

## 2017-04-11 ENCOUNTER — Ambulatory Visit: Payer: BLUE CROSS/BLUE SHIELD | Admitting: Cardiothoracic Surgery

## 2017-04-11 VITALS — BP 118/80 | HR 80 | Resp 20 | Ht 64.0 in | Wt 173.0 lb

## 2017-04-11 DIAGNOSIS — Z951 Presence of aortocoronary bypass graft: Secondary | ICD-10-CM

## 2017-04-11 DIAGNOSIS — Z5189 Encounter for other specified aftercare: Secondary | ICD-10-CM

## 2017-04-11 DIAGNOSIS — I2510 Atherosclerotic heart disease of native coronary artery without angina pectoris: Secondary | ICD-10-CM | POA: Diagnosis not present

## 2017-04-12 ENCOUNTER — Encounter: Payer: Self-pay | Admitting: Cardiothoracic Surgery

## 2017-04-12 NOTE — Progress Notes (Signed)
PCP is Glenda Chroman, MD Referring Provider is Burnell Blanks*  Chief Complaint  Patient presents with  . Routine Post Op    2 week wound check    HPI: Patient returns for final wound check and assessment of progress after multivessel CABG late 2018  She developed a superficial sternal wound infection-suture granuloma in the skin that has been treated with topical therapy.  It is finally healed.  The patient is doing well in the cardiac rehab maintenance program.  She is still on diuretic therapy and is having orthostatic symptoms so her diuretics were stopped.  She no longer requires Plavix which will be discontinued when her current supply runs out.  Past Medical History:  Diagnosis Date  . Asthma   . COPD (chronic obstructive pulmonary disease) ([place])   . GERD (gastroesophageal reflux disease)   . Shortness of breath dyspnea     Past Surgical History:  Procedure Laterality Date  . CHOLECYSTECTOMY    . COLONOSCOPY N/A 10/03/2014   Procedure: COLONOSCOPY;  Surgeon: Rogene Houston, MD;  [facility];  Service: Endoscopy;  Laterality: N/A;  730  . CORONARY ARTERY BYPASS GRAFT N/A 12/14/2016   Procedure: CORONARY ARTERY BYPASS GRAFTING (CABG) x three , using left internal mammary artery and right leg greater saphenous vein harvested endoscopically;  Surgeon: Ivin Poot, MD;  Location: [place];  Service: Open Heart Surgery;  Laterality: N/A;  . ENDARTERECTOMY Right 12/06/2014   Procedure: ENDARTERECTOMY CAROTid;  Surgeon: Elam Dutch, MD;  Location: [facility] OR;  Service: Vascular;  Laterality: Right;  . ESOPHAGOGASTRODUODENOSCOPY N/A 12/03/2016   Procedure: ESOPHAGOGASTRODUODENOSCOPY (EGD);  Surgeon: Rogene Houston, MD;  [facility];  Service: Endoscopy;  Laterality: N/A;  7:30  . LEFT HEART CATH AND CORONARY ANGIOGRAPHY N/A 12/14/2016   Procedure: LEFT HEART CATH AND CORONARY ANGIOGRAPHY;  Surgeon: Burnell Blanks, MD;  Location: [place] [facility];  Service: Cardiovascular;  Laterality: N/A;  . PERIPHERAL VASCULAR CATHETERIZATION N/A 12/05/2014   Procedure:  Carotid Arch Angiography;  Surgeon: Conrad , MD;  Location: [place] [facility];  Service: Cardiovascular;  Laterality: N/A;  . TEE WITHOUT CARDIOVERSION N/A 12/14/2016   Procedure: TRANSESOPHAGEAL ECHOCARDIOGRAM (TEE);  Surgeon: Prescott Gum, Collier Salina, MD;  Location: [place];  Service: Open Heart Surgery;  Laterality: N/A;    Family History  Problem Relation Age of Onset  . Heart attack Mother   . Cancer Mother   . Diabetes Mother   . Heart disease Mother   . Heart attack Father   . Heart disease Father     Social History Social History   Tobacco Use  . Smoking status: Former Smoker    Packs/day: 2.00    Years: 40.00    Pack years: 80.00    Last attempt to quit: 10/03/2010    Years since quitting: 6.5  . Smokeless tobacco: Never Used  . Tobacco comment: No longer using the Vapor  Substance Use Topics  . Alcohol use: No    Alcohol/week: 0.0 oz  . Drug use: No    Current Outpatient Medications  Medication Sig Dispense Refill  . ALPRAZolam (XANAX) 0.5 MG tablet Take 0.5 mg by mouth 2 (two) times daily as needed for anxiety.     Marland Kitchen aspirin 81 MG chewable tablet Chew 81 mg by mouth daily.    Marland Kitchen atorvastatin (LIPITOR) 40 MG tablet Take 1 tablet (40 mg total) daily by mouth. 30 tablet 1  .  Cholecalciferol (VITAMIN D) 2000 units CAPS Take 2,000 Units by mouth daily.    . clopidogrel (PLAVIX) 75 MG tablet Take 1 tablet (75 mg total) daily by mouth. 30 tablet 1  . metoprolol succinate (TOPROL-XL) 50 MG 24 hr tablet Take 50 mg by mouth daily.  2  . mometasone-formoterol (DULERA) 100-5 MCG/ACT AERO Inhale 2 puffs 2 (two) times daily into the lungs. 1 Inhaler 1  . Multiple Vitamin (MULTIVITAMIN WITH MINERALS) TABS tablet Take 1 tablet by mouth daily.    Marland Kitchen spironolactone (ALDACTONE) 25 MG tablet Take 0.5 tablets (12.5 mg total) at bedtime by mouth. 30 tablet 1  .  SYMBICORT 160-4.5 MCG/ACT inhaler Inhale 1 Package into the lungs 2 (two) times daily. 2 puffs twice daily  4  . torsemide (DEMADEX) 20 MG tablet Take 2 tablets (40 mg total) by mouth daily. 120 tablet 1  . VENTOLIN HFA 108 (90 BASE) MCG/ACT inhaler Inhale 2 puffs into the lungs every 6 (six) hours as needed for shortness of breath.   0   No current facility-administered medications for this visit.     No Known Allergies  Review of Systems  No fever No sternal pain No angina Orthostatic dizziness probably related to overdiuresis-stop Demadex, stop Spironolactone  BP 118/80   Pulse 80   Resp 20   Ht 5\' 4"  (1.626 m)   Wt 173 lb (78.5 kg)   SpO2 96% Comment: RA  BMI 29.70 kg/m  Physical Exam      Exam    General- alert and comfortable    Neck- no JVD, no cervical adenopathy palpable, no carotid bruit   Lungs- clear without rales, wheezes   Cor- regular rate and rhythm, no murmur , gallop   Abdomen- soft, non-tender   Extremities - warm, non-tender, minimal edema   Neuro- oriented, appropriate, no focal weakness   Diagnostic Tests: No chest x-ray today  Impression: Healed sternal wound Patient doing well after emergency CABG for cardiogenic shock, left main stenosis Plan: Return as needed Importance of heart healthy diet and lifestyle is reinforced with patient  Len Childs, MD [facility] [phone]

## 2017-04-13 ENCOUNTER — Encounter (HOSPITAL_COMMUNITY): Payer: BLUE CROSS/BLUE SHIELD

## 2017-04-15 ENCOUNTER — Encounter (HOSPITAL_COMMUNITY): Payer: BLUE CROSS/BLUE SHIELD

## 2017-04-18 ENCOUNTER — Encounter (HOSPITAL_COMMUNITY): Payer: BLUE CROSS/BLUE SHIELD

## 2017-04-20 ENCOUNTER — Encounter (HOSPITAL_COMMUNITY): Payer: BLUE CROSS/BLUE SHIELD

## 2017-04-22 ENCOUNTER — Encounter (HOSPITAL_COMMUNITY): Payer: BLUE CROSS/BLUE SHIELD

## 2017-04-25 ENCOUNTER — Encounter (HOSPITAL_COMMUNITY): Payer: BLUE CROSS/BLUE SHIELD

## 2017-04-26 ENCOUNTER — Other Ambulatory Visit (HOSPITAL_COMMUNITY): Payer: BLUE CROSS/BLUE SHIELD

## 2017-04-26 ENCOUNTER — Ambulatory Visit: Payer: BLUE CROSS/BLUE SHIELD | Admitting: Cardiology

## 2017-04-26 ENCOUNTER — Ambulatory Visit (HOSPITAL_COMMUNITY): Payer: BLUE CROSS/BLUE SHIELD | Attending: Internal Medicine

## 2017-04-26 ENCOUNTER — Encounter: Payer: Self-pay | Admitting: Cardiology

## 2017-04-26 VITALS — BP 110/76 | HR 78 | Ht 65.0 in | Wt 178.0 lb

## 2017-04-26 DIAGNOSIS — I2510 Atherosclerotic heart disease of native coronary artery without angina pectoris: Secondary | ICD-10-CM | POA: Diagnosis not present

## 2017-04-26 DIAGNOSIS — I6523 Occlusion and stenosis of bilateral carotid arteries: Secondary | ICD-10-CM

## 2017-04-26 DIAGNOSIS — I350 Nonrheumatic aortic (valve) stenosis: Secondary | ICD-10-CM | POA: Diagnosis not present

## 2017-04-26 NOTE — Patient Instructions (Signed)

## 2017-04-26 NOTE — Progress Notes (Signed)
Clinical Summary Emily Rocha is a 62 y.o.female seen today for follow up of the following medical problems.   1. CAD - s/p CABG after presenting 11/2016 with NSTEMI. Presentation complicated by cardiogenic shock - echo 12/2016 LVEF 45-50% - developed wound infection followed by CT surgery that has now healed.  - currentlty in cardiac rehab - plavix stopped previously by CT surgery  - compliant with meds - back on torsemide 40mg  daily, had held but weight gain and edema and she restarted - no significant chest pain    2. Post op afib - post op afib after CABG, converted to NSR on amio - amio has since been discontinued.  - from notes has not tolerated DOAC in the past.    - no recent palpitations. No evidence of recurrence of afib.   3. Carotid stenosis - history of prior symptomatic carotid stenosis with TIAs. Now s/p CEA - followed by vascular,  4. Aortic stenosis -mild to moderate by last echo - no symptoms  5. COPD - followed by pulmonary     Past Medical History:  Diagnosis Date  . Asthma   . COPD (chronic obstructive pulmonary disease) ([place])   . GERD (gastroesophageal reflux disease)   . Shortness of breath dyspnea      No Known Allergies   Current Outpatient Medications  Medication Sig Dispense Refill  . ALPRAZolam (XANAX) 0.5 MG tablet Take 0.5 mg by mouth 2 (two) times daily as needed for anxiety.     Marland Kitchen aspirin 81 MG chewable tablet Chew 81 mg by mouth daily.    Marland Kitchen atorvastatin (LIPITOR) 40 MG tablet Take 1 tablet (40 mg total) daily by mouth. 30 tablet 1  . Cholecalciferol (VITAMIN D) 2000 units CAPS Take 2,000 Units by mouth daily.    . clopidogrel (PLAVIX) 75 MG tablet Take 1 tablet (75 mg total) daily by mouth. 30 tablet 1  . metoprolol succinate (TOPROL-XL) 50 MG 24 hr tablet Take 50 mg by mouth daily.  2  . mometasone-formoterol (DULERA) 100-5 MCG/ACT AERO Inhale 2 puffs 2 (two) times daily into the lungs. 1 Inhaler 1  . Multiple Vitamin  (MULTIVITAMIN WITH MINERALS) TABS tablet Take 1 tablet by mouth daily.    Marland Kitchen spironolactone (ALDACTONE) 25 MG tablet Take 0.5 tablets (12.5 mg total) at bedtime by mouth. 30 tablet 1  . SYMBICORT 160-4.5 MCG/ACT inhaler Inhale 1 Package into the lungs 2 (two) times daily. 2 puffs twice daily  4  . torsemide (DEMADEX) 20 MG tablet Take 2 tablets (40 mg total) by mouth daily. 120 tablet 1  . VENTOLIN HFA 108 (90 BASE) MCG/ACT inhaler Inhale 2 puffs into the lungs every 6 (six) hours as needed for shortness of breath.   0   No current facility-administered medications for this visit.      Past Surgical History:  Procedure Laterality Date  . CHOLECYSTECTOMY    . COLONOSCOPY N/A 10/03/2014   Procedure: COLONOSCOPY;  Surgeon: Rogene Houston, MD;  [facility];  Service: Endoscopy;  Laterality: N/A;  730  . CORONARY ARTERY BYPASS GRAFT N/A 12/14/2016   Procedure: CORONARY ARTERY BYPASS GRAFTING (CABG) x three , using left internal mammary artery and right leg greater saphenous vein harvested endoscopically;  Surgeon: Ivin Poot, MD;  Location: [place];  Service: Open Heart Surgery;  Laterality: N/A;  . ENDARTERECTOMY Right 12/06/2014   Procedure: ENDARTERECTOMY CAROTid;  Surgeon: Elam Dutch, MD;  Location: [place];  Service:  Vascular;  Laterality: Right;  . ESOPHAGOGASTRODUODENOSCOPY N/A 12/03/2016   Procedure: ESOPHAGOGASTRODUODENOSCOPY (EGD);  Surgeon: Rogene Houston, MD;  [facility];  Service: Endoscopy;  Laterality: N/A;  7:30  . LEFT HEART CATH AND CORONARY ANGIOGRAPHY N/A 12/14/2016   Procedure: LEFT HEART CATH AND CORONARY ANGIOGRAPHY;  Surgeon: Burnell Blanks, MD;  Location: [place] [facility];  Service: Cardiovascular;  Laterality: N/A;  . PERIPHERAL VASCULAR CATHETERIZATION N/A 12/05/2014   Procedure:  Carotid Arch Angiography;  Surgeon: Conrad [place], MD;  Location: [place] [facility];  Service: Cardiovascular;  Laterality: N/A;  . TEE WITHOUT  CARDIOVERSION N/A 12/14/2016   Procedure: TRANSESOPHAGEAL ECHOCARDIOGRAM (TEE);  Surgeon: Prescott Gum, Collier Salina, MD;  Location: [place];  Service: Open Heart Surgery;  Laterality: N/A;     No Known Allergies    Family History  Problem Relation Age of Onset  . Heart attack Mother   . Cancer Mother   . Diabetes Mother   . Heart disease Mother   . Heart attack Father   . Heart disease Father      Social History Emily Rocha reports that she quit smoking about 6 years ago. She has a 80.00 pack-year smoking history. she has never used smokeless tobacco. Emily Rocha reports that she does not drink alcohol.   Review of Systems CONSTITUTIONAL: No weight loss, fever, chills, weakness or fatigue.  HEENT: Eyes: No visual loss, blurred vision, double vision or yellow sclerae.No hearing loss, sneezing, congestion, runny nose or sore throat.  SKIN: No rash or itching.  CARDIOVASCULAR: per hpi RESPIRATORY: No shortness of breath, cough or sputum.  GASTROINTESTINAL: No anorexia, nausea, vomiting or diarrhea. No abdominal pain or blood.  GENITOURINARY: No burning on urination, no polyuria NEUROLOGICAL: No headache, dizziness, syncope, paralysis, ataxia, numbness or tingling in the extremities. No change in bowel or bladder control.  MUSCULOSKELETAL: No muscle, back pain, joint pain or stiffness.  LYMPHATICS: No enlarged nodes. No history of splenectomy.  PSYCHIATRIC: No history of depression or anxiety.  ENDOCRINOLOGIC: No reports of sweating, cold or heat intolerance. No polyuria or polydipsia.  Marland Kitchen   Physical Examination Vitals:   04/26/17 1040  BP: 110/76  Pulse: 78  SpO2: 94%   Vitals:   04/26/17 1040  Weight: 178 lb (80.7 kg)  Height: 5\' 5"  (1.651 m)    Gen: resting comfortably, no acute distress HEENT: no scleral icterus, pupils equal round and reactive, no palptable cervical adenopathy,  CV: RRR, no m/r/g, no jvd Resp: Clear to auscultation bilaterally GI: abdomen is soft, non-tender,  non-distended, normal bowel sounds, no hepatosplenomegaly MSK: extremities are warm, no edema.  Skin: warm, no rash Neuro:  no focal deficits Psych: appropriate affect   Diagnostic Studies 06/2015 echo Study Conclusions  - Left ventricle: The cavity size was normal. Wall thickness was  increased in a pattern of mild concentric LVH, with moderate  focal basal septal hypertrophy. Systolic function was normal. The  estimated ejection fraction was in the range of 60% to 65%. Wall  motion was normal; there were no regional wall motion  abnormalities. Doppler parameters are consistent with abnormal  left ventricular relaxation (grade 1 diastolic dysfunction).  Indeterminate filling pressures. - Aortic valve: Mildly to moderately calcified annulus. Mildly  thickened, mildly calcified leaflets. There was mild stenosis.  Peak velocity (S): 231 cm/s. Mean gradient (S): 9 mm Hg. Valve  area (VTI): 1.69 cm^2. Valve area (Vmean): 1.69 cm^2. - Mitral valve: Mildly calcified annulus. Normal thickness leaflets  .  06/2015 Stress MPI  Equivocal ST segment depression, 0.5 mm in leads II, III, aVF, and V4 through V6. Resolved quickly in recovery. Intermediate risk Duke treadmill score of 1.5 based on limited exercise time and nondiagnostic ST segment change.  Blood pressure demonstrated a hypertensive response to exercise.  Small, mild intensity, basal septal defect that exhibits partial reversibility, suspect related to variable soft tissue attenuation rather than ischemia.  This is a low risk study based on perfusion imaging.  Nuclear stress EF: 75%.  11/2016 cath  Mid RCA lesion, 60 %stenosed.  Prox RCA lesion, 40 %stenosed.  Ost RPDA to RPDA lesion, 90 %stenosed.  Ost Cx to Prox Cx lesion, 100 %stenosed.  Mid LAD lesion, 80 %stenosed.  Ost 2nd Diag to 2nd Diag lesion, 40 %stenosed.  Prox LAD to Mid LAD lesion, 40 %stenosed.  Ost LM to LM lesion, 99 %stenosed.   1.  Severe distal left main stenosis 2. Severe mid LAD stenosis 3. 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  4. Moderately severe, calcified stenosis of the mid RCA 5. Elevated filling pressures.   11/2016 echo  Mid RCA lesion, 60 %stenosed.  Prox RCA lesion, 40 %stenosed.  Ost RPDA to RPDA lesion, 90 %stenosed.  Ost Cx to Prox Cx lesion, 100 %stenosed.  Mid LAD lesion, 80 %stenosed.  Ost 2nd Diag to 2nd Diag lesion, 40 %stenosed.  Prox LAD to Mid LAD lesion, 40 %stenosed.  Ost LM to LM lesion, 99 %stenosed.   1. Severe distal left main stenosis 2. Severe mid LAD stenosis 3. 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  4. Moderately severe, calcified stenosis of the mid RCA 5. Elevated filling pressures.   12/2016 echo Study Conclusions  - HPI and indications: Limited for LV function & Rule out effusion. - Left ventricle: Systolic function was mildly reduced. The   estimated ejection fraction was in the range of 45% to 50%. - Aortic valve: Mildly calcified leaflets. Mild to moderate   [place]. Mean gradient (S): 17 mm Hg. Peak gradient (S): 28 mm   Hg. Valve area (VTI): 1.01 cm^2. Valve area (Vmax): 1.1 cm^2.   Valve area (Vmean): 1.02 cm^2.  Impressions:  - Compared to the recent study on 12/12/16, the LVEF is unchaged at   45-50%. There is mild to moderate aortic stenosis. No pericardial   effusion is noted.  Assessment and Plan   1. CAD -s/p CABG, doing well in her recovery - we will continue current meds  2. Carotid stenosis - continue medical therapy, continue to follow with vascular  3. Postop afib - now off amio. No evidence of recurrence thus far. Continue to monitor. No indication for anticoag. Notes mention issues with DOACs in the past, will need to review records further. Fortunately not indicated at this time  4. Aortic stenosis -  mild to moderate by echo, no symptoms to suggest progression, continue to monitor.    F/u 6 months   Arnoldo Lenis, M.D.

## 2017-04-29 ENCOUNTER — Encounter: Payer: Self-pay | Admitting: Cardiology

## 2017-09-06 ENCOUNTER — Other Ambulatory Visit (HOSPITAL_COMMUNITY): Payer: Self-pay | Admitting: Nurse Practitioner

## 2017-09-06 DIAGNOSIS — Z1231 Encounter for screening mammogram for malignant neoplasm of breast: Secondary | ICD-10-CM

## 2017-09-28 ENCOUNTER — Ambulatory Visit (HOSPITAL_COMMUNITY)
Admission: RE | Admit: 2017-09-28 | Discharge: 2017-09-28 | Disposition: A | Discharge status: Home / Self Care | Payer: BLUE CROSS/BLUE SHIELD | Source: Ambulatory Visit | Attending: Nurse Practitioner | Admitting: Nurse Practitioner

## 2017-09-28 ENCOUNTER — Other Ambulatory Visit (HOSPITAL_COMMUNITY): Payer: Self-pay | Admitting: Nurse Practitioner

## 2017-09-28 DIAGNOSIS — G458 Other transient cerebral ischemic attacks and related syndromes: Secondary | ICD-10-CM | POA: Insufficient documentation

## 2017-09-28 DIAGNOSIS — I6522 Occlusion and stenosis of left carotid artery: Secondary | ICD-10-CM | POA: Insufficient documentation

## 2017-09-28 DIAGNOSIS — I6529 Occlusion and stenosis of unspecified carotid artery: Secondary | ICD-10-CM | POA: Diagnosis present

## 2017-09-28 DIAGNOSIS — I6523 Occlusion and stenosis of bilateral carotid arteries: Secondary | ICD-10-CM

## 2017-09-28 DIAGNOSIS — R200 Anesthesia of skin: Secondary | ICD-10-CM | POA: Diagnosis not present

## 2017-09-29 ENCOUNTER — Other Ambulatory Visit (HOSPITAL_COMMUNITY)
Admission: RE | Admit: 2017-09-29 | Discharge: 2017-09-29 | Disposition: A | Discharge status: Home / Self Care | Payer: BLUE CROSS/BLUE SHIELD | Source: Ambulatory Visit | Attending: Nurse Practitioner | Admitting: Nurse Practitioner

## 2017-09-29 DIAGNOSIS — R5383 Other fatigue: Secondary | ICD-10-CM | POA: Insufficient documentation

## 2017-09-29 LAB — CBC
HEMATOCRIT: 42.3 % (ref 36.0–46.0)
HEMOGLOBIN: 13.4 g/dL (ref 12.0–15.0)
MCH: 28.9 pg (ref 26.0–34.0)
MCHC: 31.7 g/dL (ref 30.0–36.0)
MCV: 91.4 fL (ref 78.0–100.0)
Platelets: 218 10*3/uL (ref 150–400)
RBC: 4.63 MIL/uL (ref 3.87–5.11)
RDW: 13.6 % (ref 11.5–15.5)
WBC: 5.1 10*3/uL (ref 4.0–10.5)

## 2017-09-29 LAB — BASIC METABOLIC PANEL
Anion gap: 10 (ref 5–15)
BUN: 16 mg/dL (ref 8–23)
CALCIUM: 9.7 mg/dL (ref 8.9–10.3)
CO2: 31 mmol/L (ref 22–32)
Chloride: 100 mmol/L (ref 98–111)
Creatinine, Ser: 0.68 mg/dL (ref 0.44–1.00)
Glucose, Bld: 100 mg/dL — ABNORMAL HIGH (ref 70–99)
POTASSIUM: 4.3 mmol/L (ref 3.5–5.1)
Sodium: 141 mmol/L (ref 135–145)

## 2017-10-03 ENCOUNTER — Encounter (HOSPITAL_COMMUNITY): Payer: Self-pay

## 2017-10-03 ENCOUNTER — Ambulatory Visit (HOSPITAL_COMMUNITY)
Admission: RE | Admit: 2017-10-03 | Discharge: 2017-10-03 | Disposition: A | Discharge status: Home / Self Care | Payer: BLUE CROSS/BLUE SHIELD | Source: Ambulatory Visit | Attending: Nurse Practitioner | Admitting: Nurse Practitioner

## 2017-10-03 DIAGNOSIS — Z1231 Encounter for screening mammogram for malignant neoplasm of breast: Secondary | ICD-10-CM | POA: Insufficient documentation

## 2017-10-20 ENCOUNTER — Ambulatory Visit (INDEPENDENT_AMBULATORY_CARE_PROVIDER_SITE_OTHER): Payer: BLUE CROSS/BLUE SHIELD | Admitting: Vascular Surgery

## 2017-10-20 ENCOUNTER — Encounter: Payer: Self-pay | Admitting: Vascular Surgery

## 2017-10-20 ENCOUNTER — Other Ambulatory Visit: Payer: Self-pay

## 2017-10-20 VITALS — BP 103/71 | HR 78 | Temp 99.7°F | Resp 18 | Ht 63.5 in | Wt 187.0 lb

## 2017-10-20 DIAGNOSIS — I6523 Occlusion and stenosis of bilateral carotid arteries: Secondary | ICD-10-CM

## 2017-10-20 NOTE — Progress Notes (Signed)
Patient is a 62 year old female who returns for follow-up today.  She underwent right carotid endarterectomy October 2016.  Recently she began having episodes of numbness and tingling in her left upper extremity.  She is now having episodes of numbness and tingling in the left arm left leg and left face.  She had a head CT done a few weeks ago which showed no stroke.  She had a carotid duplex performed which suggested a right common carotid artery stenosis.  The internal carotid artery was widely patent.  She had coronary artery bypass grafting about 10 months ago.  She states that she is not smoking.  She currently denies chest pain.  She is on Plavix aspirin and a statin.  Past Medical History:  Diagnosis Date  . Asthma   . COPD (chronic obstructive pulmonary disease) ([place])   . GERD (gastroesophageal reflux disease)   . Shortness of breath dyspnea    Past Surgical History:  Procedure Laterality Date  . CHOLECYSTECTOMY    . COLONOSCOPY N/A 10/03/2014   Procedure: COLONOSCOPY;  Surgeon: Rogene Houston, MD;  [facility];  Service: Endoscopy;  Laterality: N/A;  730  . CORONARY ARTERY BYPASS GRAFT N/A 12/14/2016   Procedure: CORONARY ARTERY BYPASS GRAFTING (CABG) x three , using left internal mammary artery and right leg greater saphenous vein harvested endoscopically;  Surgeon: Ivin Poot, MD;  Location: [place];  Service: Open Heart Surgery;  Laterality: N/A;  . ENDARTERECTOMY Right 12/06/2014   Procedure: ENDARTERECTOMY CAROTid;  Surgeon: Elam Dutch, MD;  Location: [facility] OR;  Service: Vascular;  Laterality: Right;  . ESOPHAGOGASTRODUODENOSCOPY N/A 12/03/2016   Procedure: ESOPHAGOGASTRODUODENOSCOPY (EGD);  Surgeon: Rogene Houston, MD;  [facility];  Service: Endoscopy;  Laterality: N/A;  7:30  . LEFT HEART CATH AND CORONARY ANGIOGRAPHY N/A 12/14/2016   Procedure: LEFT HEART CATH AND CORONARY ANGIOGRAPHY;  Surgeon: Burnell Blanks, MD;  Location: [place] [facility];  Service: Cardiovascular;  Laterality: N/A;  . PERIPHERAL VASCULAR CATHETERIZATION N/A 12/05/2014   Procedure:  Carotid Arch Angiography;  Surgeon: Conrad Stanley, MD;  Location: [place] [facility];  Service: Cardiovascular;  Laterality: N/A;  . TEE WITHOUT CARDIOVERSION N/A 12/14/2016   Procedure: TRANSESOPHAGEAL ECHOCARDIOGRAM (TEE);  Surgeon: Prescott Gum, Collier Salina, MD;  Location: [place];  Service: Open Heart Surgery;  Laterality: N/A;    Current Outpatient Medications on File Prior to Visit  Medication Sig Dispense Refill  . ALPRAZolam (XANAX) 0.5 MG tablet Take 0.5 mg by mouth 2 (two) times daily as needed for anxiety.     Marland Kitchen aspirin 81 MG chewable tablet Chew 81 mg by mouth daily.    Marland Kitchen atorvastatin (LIPITOR) 40 MG tablet Take 1 tablet (40 mg total) daily by mouth. 30 tablet 1  . Cholecalciferol (VITAMIN D) 2000 units CAPS Take 2,000 Units by mouth daily.    . metoprolol succinate (TOPROL-XL) 50 MG 24 hr tablet Take 50 mg by mouth daily.  2  . mometasone-formoterol (DULERA) 100-5 MCG/ACT AERO Inhale 2 puffs 2 (two) times daily into the lungs. 1 Inhaler 1  . Multiple Vitamin (MULTIVITAMIN WITH MINERALS) TABS tablet Take 1 tablet by mouth daily.    . SYMBICORT 160-4.5 MCG/ACT inhaler Inhale 1 Package into the lungs 2 (two) times daily. 2 puffs twice daily  4  . torsemide (DEMADEX) 20 MG tablet Take 2 tablets (40 mg total) by mouth daily. 120 tablet 1  . VENTOLIN HFA 108 (90 BASE) MCG/ACT inhaler  Inhale 2 puffs into the lungs every 6 (six) hours as needed for shortness of breath.   0  . clopidogrel (PLAVIX) 75 MG tablet Take 1 tablet (75 mg total) daily by mouth. (Patient not taking: Reported on 10/20/2017) 30 tablet 1  . spironolactone (ALDACTONE) 25 MG tablet Take 0.5 tablets (12.5 mg total) at bedtime by mouth. (Patient not taking: Reported on 10/20/2017) 30 tablet 1   No current facility-administered medications on file prior to visit.     Review of systems: She denies chest pain or  shortness of breath.  Physical exam:  Vitals:   10/20/17 1223 10/20/17 1227 10/20/17 1231  BP: 119/74 132/72 103/71  Pulse: 78    Resp: 18    Temp: 99.7 F (37.6 C)    TempSrc: Oral    SpO2: 97%    Weight: 187 lb (84.8 kg)    Height: 5' 3.5" (1.613 m)      Neck: Soft left carotid bruit moderate right carotid bruit well-healed right neck scar  Chest: Clear to auscultation bilaterally  Cardiac: Regular rate and rhythm 3/6 systolic murmur heard throughout the precordium  Neuro: Symmetric upper extremity and lower extremity motor strength 5/5 no facial asymmetry  Extremities: 2+ brachial pulses bilaterally, 1+ left radial absent right radial pulse  Data: Patient had a duplex ultrasound of her carotid to [facility] on August 14.  Suggested a proximal common carotid artery stenosis.  Left and right internal carotid arteries had no significant stenosis.  Vertebral arteries had antegrade flow bilaterally  Assessment: Numbness tingling left side similar to patient's prior TIA symptoms.  Possible right common carotid stenosis.  Plan: The patient will be scheduled for a CT angiogram of the neck and see me back in the office next week for consideration of TCAR or common carotid stenting depending on her anatomy and whether this correlates with her ultrasound.  She will continue her Plavix and aspirin.  [name], MD [facility] Office: [phone] Pager: [phone]

## 2017-10-27 ENCOUNTER — Ambulatory Visit (INDEPENDENT_AMBULATORY_CARE_PROVIDER_SITE_OTHER): Payer: BLUE CROSS/BLUE SHIELD | Admitting: Vascular Surgery

## 2017-10-27 ENCOUNTER — Ambulatory Visit
Admission: RE | Admit: 2017-10-27 | Discharge: 2017-10-27 | Disposition: A | Discharge status: Home / Self Care | Payer: BLUE CROSS/BLUE SHIELD | Source: Ambulatory Visit | Attending: Vascular Surgery | Admitting: Vascular Surgery

## 2017-10-27 ENCOUNTER — Encounter: Payer: Self-pay | Admitting: *Deleted

## 2017-10-27 ENCOUNTER — Other Ambulatory Visit: Payer: Self-pay | Admitting: *Deleted

## 2017-10-27 ENCOUNTER — Encounter: Payer: Self-pay | Admitting: Vascular Surgery

## 2017-10-27 ENCOUNTER — Other Ambulatory Visit: Payer: Self-pay

## 2017-10-27 VITALS — BP 124/69 | HR 94 | Resp 18 | Ht 63.5 in | Wt 190.3 lb

## 2017-10-27 DIAGNOSIS — R918 Other nonspecific abnormal finding of lung field: Secondary | ICD-10-CM | POA: Diagnosis not present

## 2017-10-27 DIAGNOSIS — I739 Peripheral vascular disease, unspecified: Secondary | ICD-10-CM | POA: Diagnosis not present

## 2017-10-27 DIAGNOSIS — I6523 Occlusion and stenosis of bilateral carotid arteries: Secondary | ICD-10-CM

## 2017-10-27 MED ORDER — IOPAMIDOL (ISOVUE-370) INJECTION 76%
75.0000 mL | Freq: Once | INTRAVENOUS | Status: AC | PRN
  Administered 2017-10-27: 75 mL via INTRAVENOUS

## 2017-10-27 NOTE — Progress Notes (Signed)
Patient is a 62 year old female who returns for follow-up today after recent CT Angio of the neck.  She has recently had several episodes of numbness and tingling in her left upper extremity.  This has occasionally affected her left leg and face.  Previously underwent right carotid endarterectomy October 2016.  Recent head CT showed no evidence of stroke.  She is on Plavix aspirin and a statin.  She states that she had a left upper extremity episode about 8 days ago.  This again was numbness and tingling.  Review of systems: She denies shortness of breath.  She denies chest pain.  States that she currently is not smoking.  However the room did smell of tobacco.   Physical exam:  Vitals:   10/27/17 1459 10/27/17 1500  BP: 106/71 124/69  Pulse: 94   Resp: 18   SpO2: 94%   Weight: 190 lb 4.8 oz (86.3 kg)   Height: 5' 3.5" (1.613 m)     Neck: Bilateral carotid bruits right greater than left Neuro: Symmetric upper extremity lower extremity motor strength 5/5 no facial asymmetry  Data: I reviewed the patient's images for her CT Angio of the neck today.  This shows calcification at the origin of all great vessels.  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  Her endarterectomy site is widely patent as well as her left carotid bifurcation.  Of note she has a newly seen spiculated right upper lobe mass consistent with cancer  Assessment: #1 possible innominate or right common carotid origin stenosis which could account for her left side numbness symptoms.  However, it is difficult to know whether or not these may be more of a peripheral or other related symptom and the natural history of great vessel lesions is ill-defined.  In order to better determine the precise amount of stenosis and icterus to go the lesion I believe she needs an arch aortogram possible innominate  or common carotid artery intervention with angioplasty/stenting.  Risk benefits possible complications of procedure details were discussed with the patient today including not limited to bleeding infection vessel injury stroke risk contrast reaction.  She understands and agrees to proceed.  Apparently when she had a prior arteriogram by Dr. Bridgett Larsson they had difficulty accessing the right side so we may need to consider left side access.  This is scheduled for November 04, 2017.  #2 right upper lobe lung mass.  I spoke with Dr. Prescott Gum today.  We will order a PET scan to further evaluate this and she will see him he has an office visit to consider whether or not she is a candidate for resection of this.  [name], MD [facility] [place] Office: [phone] Pager: [phone]

## 2017-10-27 NOTE — H&P (View-Only) (Signed)
Patient is a 62 year old female who returns for follow-up today after recent CT Angio of the neck.  She has recently had several episodes of numbness and tingling in her left upper extremity.  This has occasionally affected her left leg and face.  Previously underwent right carotid endarterectomy October 2016.  Recent head CT showed no evidence of stroke.  She is on Plavix aspirin and a statin.  She states that she had a left upper extremity episode about 8 days ago.  This again was numbness and tingling.  Review of systems: She denies shortness of breath.  She denies chest pain.  States that she currently is not smoking.  However the room did smell of tobacco.   Physical exam:  Vitals:   10/27/17 1459 10/27/17 1500  BP: 106/71 124/69  Pulse: 94   Resp: 18   SpO2: 94%   Weight: 190 lb 4.8 oz (86.3 kg)   Height: 5' 3.5" (1.613 m)     Neck: Bilateral carotid bruits right greater than left Neuro: Symmetric upper extremity lower extremity motor strength 5/5 no facial asymmetry  Data: I reviewed the patient's images for her CT Angio of the neck today.  This shows calcification at the origin of all great vessels.  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  Her endarterectomy site is widely patent as well as her left carotid bifurcation.  Of note she has a newly seen spiculated right upper lobe mass consistent with cancer  Assessment: #1 possible innominate or right common carotid origin stenosis which could account for her left side numbness symptoms.  However, it is difficult to know whether or not these may be more of a peripheral or other related symptom and the natural history of great vessel lesions is ill-defined.  In order to better determine the precise amount of stenosis and icterus to go the lesion I believe she needs an arch aortogram possible innominate  or common carotid artery intervention with angioplasty/stenting.  Risk benefits possible complications of procedure details were discussed with the patient today including not limited to bleeding infection vessel injury stroke risk contrast reaction.  She understands and agrees to proceed.  Apparently when she had a prior arteriogram by Dr. Bridgett Larsson they had difficulty accessing the right side so we may need to consider left side access.  This is scheduled for November 04, 2017.  #2 right upper lobe lung mass.  I spoke with Dr. Prescott Gum today.  We will order a PET scan to further evaluate this and she will see him he has an office visit to consider whether or not she is a candidate for resection of this.  [name], MD [facility] Office: [phone] Pager: [phone]

## 2017-10-28 ENCOUNTER — Other Ambulatory Visit: Payer: Self-pay

## 2017-10-28 DIAGNOSIS — R918 Other nonspecific abnormal finding of lung field: Secondary | ICD-10-CM

## 2017-10-31 ENCOUNTER — Ambulatory Visit (HOSPITAL_COMMUNITY)
Admission: RE | Admit: 2017-10-31 | Discharge: 2017-10-31 | Disposition: A | Discharge status: Home / Self Care | Payer: BLUE CROSS/BLUE SHIELD | Source: Ambulatory Visit | Attending: Vascular Surgery | Admitting: Vascular Surgery

## 2017-10-31 DIAGNOSIS — I700 Atherosclerosis of aorta: Secondary | ICD-10-CM | POA: Diagnosis not present

## 2017-10-31 DIAGNOSIS — R918 Other nonspecific abnormal finding of lung field: Secondary | ICD-10-CM | POA: Insufficient documentation

## 2017-10-31 DIAGNOSIS — J439 Emphysema, unspecified: Secondary | ICD-10-CM | POA: Insufficient documentation

## 2017-10-31 DIAGNOSIS — K449 Diaphragmatic hernia without obstruction or gangrene: Secondary | ICD-10-CM | POA: Insufficient documentation

## 2017-10-31 MED ORDER — FLUDEOXYGLUCOSE F - 18 (FDG) INJECTION
13.3900 | Freq: Once | INTRAVENOUS | Status: AC | PRN
  Administered 2017-10-31: 13.39 via INTRAVENOUS

## 2017-11-04 ENCOUNTER — Ambulatory Visit (HOSPITAL_COMMUNITY)
Admission: RE | Admit: 2017-11-04 | Discharge: 2017-11-04 | Disposition: A | Discharge status: Home / Self Care | Payer: BLUE CROSS/BLUE SHIELD | Source: Ambulatory Visit | Attending: Vascular Surgery | Admitting: Vascular Surgery

## 2017-11-04 ENCOUNTER — Encounter (HOSPITAL_COMMUNITY)
Admission: RE | Disposition: A | Discharge status: Home / Self Care | Payer: Self-pay | Source: Ambulatory Visit | Attending: Vascular Surgery

## 2017-11-04 ENCOUNTER — Encounter (HOSPITAL_COMMUNITY): Payer: Self-pay | Admitting: Vascular Surgery

## 2017-11-04 ENCOUNTER — Other Ambulatory Visit: Payer: Self-pay

## 2017-11-04 DIAGNOSIS — R918 Other nonspecific abnormal finding of lung field: Secondary | ICD-10-CM | POA: Diagnosis not present

## 2017-11-04 DIAGNOSIS — R200 Anesthesia of skin: Secondary | ICD-10-CM | POA: Insufficient documentation

## 2017-11-04 DIAGNOSIS — Z7902 Long term (current) use of antithrombotics/antiplatelets: Secondary | ICD-10-CM | POA: Diagnosis not present

## 2017-11-04 DIAGNOSIS — R202 Paresthesia of skin: Secondary | ICD-10-CM | POA: Diagnosis not present

## 2017-11-04 DIAGNOSIS — Z9889 Other specified postprocedural states: Secondary | ICD-10-CM | POA: Diagnosis not present

## 2017-11-04 DIAGNOSIS — I6523 Occlusion and stenosis of bilateral carotid arteries: Secondary | ICD-10-CM | POA: Insufficient documentation

## 2017-11-04 DIAGNOSIS — Z7982 Long term (current) use of aspirin: Secondary | ICD-10-CM | POA: Insufficient documentation

## 2017-11-04 DIAGNOSIS — I6521 Occlusion and stenosis of right carotid artery: Secondary | ICD-10-CM

## 2017-11-04 DIAGNOSIS — Z87891 Personal history of nicotine dependence: Secondary | ICD-10-CM | POA: Diagnosis not present

## 2017-11-04 HISTORY — PX: AORTIC ARCH ANGIOGRAPHY: CATH118224

## 2017-11-04 LAB — POCT I-STAT, CHEM 8
BUN: 19 mg/dL (ref 8–23)
CREATININE: 0.7 mg/dL (ref 0.44–1.00)
Calcium, Ion: 1.01 mmol/L — ABNORMAL LOW (ref 1.15–1.40)
Chloride: 103 mmol/L (ref 98–111)
Glucose, Bld: 99 mg/dL (ref 70–99)
HEMATOCRIT: 43 % (ref 36.0–46.0)
HEMOGLOBIN: 14.6 g/dL (ref 12.0–15.0)
POTASSIUM: 5 mmol/L (ref 3.5–5.1)
Sodium: 137 mmol/L (ref 135–145)
TCO2: 30 mmol/L (ref 22–32)

## 2017-11-04 SURGERY — AORTIC ARCH ANGIOGRAPHY
Anesthesia: LOCAL

## 2017-11-04 MED ORDER — SODIUM CHLORIDE 0.9 % IV SOLN
INTRAVENOUS | Status: DC
  Administered 2017-11-04: 10:00:00 via INTRAVENOUS

## 2017-11-04 MED ORDER — HYDRALAZINE HCL 20 MG/ML IJ SOLN: 5.0000 mg | INTRAMUSCULAR | Status: DC | PRN

## 2017-11-04 MED ORDER — HEPARIN (PORCINE) IN NACL 1000-0.9 UT/500ML-% IV SOLN
INTRAVENOUS | Status: AC
  Filled 2017-11-04: qty 500

## 2017-11-04 MED ORDER — ONDANSETRON HCL 4 MG/2ML IJ SOLN: 4.0000 mg | Freq: Four times a day (QID) | INTRAMUSCULAR | Status: DC | PRN

## 2017-11-04 MED ORDER — MIDAZOLAM HCL 2 MG/2ML IJ SOLN
INTRAMUSCULAR | Status: DC | PRN
  Administered 2017-11-04: 1 mg via INTRAVENOUS

## 2017-11-04 MED ORDER — SODIUM CHLORIDE 0.9 % IV SOLN: 250.0000 mL | INTRAVENOUS | Status: DC | PRN

## 2017-11-04 MED ORDER — LABETALOL HCL 5 MG/ML IV SOLN: 10.0000 mg | INTRAVENOUS | Status: DC | PRN

## 2017-11-04 MED ORDER — MIDAZOLAM HCL 2 MG/2ML IJ SOLN
INTRAMUSCULAR | Status: AC
  Filled 2017-11-04: qty 2

## 2017-11-04 MED ORDER — LIDOCAINE HCL (PF) 1 % IJ SOLN
INTRAMUSCULAR | Status: DC | PRN
  Administered 2017-11-04: 15 mL

## 2017-11-04 MED ORDER — FENTANYL CITRATE (PF) 100 MCG/2ML IJ SOLN
INTRAMUSCULAR | Status: DC | PRN
  Administered 2017-11-04: 25 ug via INTRAVENOUS

## 2017-11-04 MED ORDER — HEPARIN (PORCINE) IN NACL 1000-0.9 UT/500ML-% IV SOLN
INTRAVENOUS | Status: DC | PRN
  Administered 2017-11-04: 500 mL

## 2017-11-04 MED ORDER — CLOPIDOGREL BISULFATE 75 MG PO TABS: 75.0000 mg | ORAL_TABLET | Freq: Every day | ORAL | 11 refills | Status: AC

## 2017-11-04 MED ORDER — SODIUM CHLORIDE 0.9 % IV SOLN: INTRAVENOUS | Status: AC

## 2017-11-04 MED ORDER — FENTANYL CITRATE (PF) 100 MCG/2ML IJ SOLN
INTRAMUSCULAR | Status: AC
  Filled 2017-11-04: qty 2

## 2017-11-04 MED ORDER — LIDOCAINE HCL (PF) 1 % IJ SOLN
INTRAMUSCULAR | Status: AC
  Filled 2017-11-04: qty 30

## 2017-11-04 MED ORDER — IODIXANOL 320 MG/ML IV SOLN
INTRAVENOUS | Status: DC | PRN
  Administered 2017-11-04: 90 mL via INTRA_ARTERIAL

## 2017-11-04 MED ORDER — SODIUM CHLORIDE 0.9% FLUSH: 3.0000 mL | INTRAVENOUS | Status: DC | PRN

## 2017-11-04 MED ORDER — SODIUM CHLORIDE 0.9% FLUSH: 3.0000 mL | Freq: Two times a day (BID) | INTRAVENOUS | Status: DC

## 2017-11-04 MED ORDER — ACETAMINOPHEN 325 MG PO TABS: 650.0000 mg | ORAL_TABLET | ORAL | Status: DC | PRN

## 2017-11-04 MED ORDER — MORPHINE SULFATE (PF) 10 MG/ML IV SOLN: 2.0000 mg | INTRAVENOUS | Status: DC | PRN

## 2017-11-04 MED ORDER — OXYCODONE HCL 5 MG PO TABS: 5.0000 mg | ORAL_TABLET | ORAL | Status: DC | PRN

## 2017-11-04 SURGICAL SUPPLY — 7 items
CATH ANGIO 5F PIGTAIL 100CM (CATHETERS) ×2 IMPLANT
KIT PV (KITS) ×2 IMPLANT
SHEATH PINNACLE 5F 10CM (SHEATH) ×2 IMPLANT
SYR MEDRAD MARK V 150ML (SYRINGE) ×2 IMPLANT
TRANSDUCER W/STOPCOCK (MISCELLANEOUS) ×2 IMPLANT
TRAY PV CATH (CUSTOM PROCEDURE TRAY) ×2 IMPLANT
WIRE HITORQ VERSACORE ST 145CM (WIRE) ×2 IMPLANT

## 2017-11-04 NOTE — Op Note (Signed)
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  50% stenosis left common carotid origin severe calcification  Operative details: After obtaining informed consent, patient taken the PV lab.  The patient was placed in supine position on Angio table.  Both groins were prepped and draped in usual sterile fashion.  Local anesthesia was ruptured of the right common femoral artery.  Ultrasound was used to identify the right common femoral artery and femoral bifurcation.  This was patent.  A hardcopy image was obtained to be placed in the patient's permanent medical record.  At this point an introducer needle was used to cannulate the right common femoral artery and 035 versacore wire threaded up into the abdominal aorta under fluoroscopic guidance.  A 5 French sheath was placed over the guidewire in the right common femoral artery.  This was thoroughly flushed with heparin I saline.  A 5 French pigtail catheter was then advanced over the guidewire and these were placed as a unit up in the ascending aorta.  Arch aortogram was then obtained in a 40 degree LAO projection.  This shows a severely calcified irregular exophytic lesion in the innominate artery producing about a 90% stenosis the plaque extends up to the origins of the right subclavian and right common carotid artery.  There is a carotid endarterectomy just above this which is widely patent and slightly patulous.  There is also a 50% stenosis at the origin of the left common carotid artery.  The left subclavian artery is patent.  The vertebral arteries are patent with antegrade flow.  At this point the pico catheter was removed over guidewire.  The patient tolerated procedure well and there  were no complications.  The 5 French sheath was left in place to be pulled in the holding area.  Operative management: #1 patient has not had a TIA event in 2 weeks but has recently had symptoms of TIA most likely from this innominate artery stenosis.  She also has a known right upper lobe most likely lung cancer and is being evaluated by Dr. Prescott Gum for resection of this.  I will add Plavix to her aspirin and statin for maximal medical management.  Dr. Prescott Gum is to see the patient outpatient in the near future.  We will have further discussions regarding whether or not we need to proceed with innominate artery bypass for stroke prophylaxis versus treating her lung cancer first.  In the meanwhile she will remain on aspirin Plavix and a statin.  [name], MD [facility] Office: [phone] Pager: [phone]

## 2017-11-04 NOTE — Interval H&P Note (Signed)
History and Physical Interval Note:  11/04/2017 11:30 AM  [name]  has presented today for surgery, with the diagnosis of carotid stenosis  The various methods of treatment have been discussed with the patient and family. After consideration of risks, benefits and other options for treatment, the patient has consented to  Procedure(s): AORTIC ARCH ANGIOGRAPHY (N/A) as a surgical intervention .  The patient's history has been reviewed, patient examined, no change in status, stable for surgery.  I have reviewed the patient's chart and labs.  Questions were answered to the patient's satisfaction.     [name]

## 2017-11-04 NOTE — Discharge Instructions (Signed)

## 2017-11-07 ENCOUNTER — Encounter (HOSPITAL_COMMUNITY): Payer: Self-pay | Admitting: Vascular Surgery

## 2017-11-11 ENCOUNTER — Other Ambulatory Visit: Payer: Self-pay | Admitting: *Deleted

## 2017-11-11 ENCOUNTER — Institutional Professional Consult (permissible substitution): Payer: BLUE CROSS/BLUE SHIELD | Admitting: Cardiothoracic Surgery

## 2017-11-11 ENCOUNTER — Encounter: Payer: Self-pay | Admitting: Cardiothoracic Surgery

## 2017-11-11 VITALS — BP 124/76 | HR 84 | Resp 20 | Ht 66.0 in | Wt 185.0 lb

## 2017-11-11 DIAGNOSIS — R918 Other nonspecific abnormal finding of lung field: Secondary | ICD-10-CM

## 2017-11-11 DIAGNOSIS — Z951 Presence of aortocoronary bypass graft: Secondary | ICD-10-CM

## 2017-11-11 NOTE — Progress Notes (Signed)
PCP is Glenda Chroman, MD Referring Provider is Elam Dutch, MD  Chief Complaint  Patient presents with  . Lung Mass    Surgical eval, CT Neck 10/27/17, PET Scan 10/31/17   Patient examined, CT scan and PET scan images personally reviewed and counseled with patient and family. HPI: 62 year old female reformed smoker with heavy tobacco history presents for evaluation of recently diagnosed right upper lobe 11 mm nodule which is extremely hypermetabolic on PET scan with SUV of 10.2 -- this was an incidental finding on a CTA of her neck to study her carotid vessels.    1-year ago she had emergency CABG x3 when she presented in cardiogenic shock with left main stenosis and non-STEMI.  At that time a CT scan had been performed which did not show the nodule.  Patient does have COPD and had difficulties with hypercarbia following sternotomy and CABG with pCo2 range between 50 and 67mmHg.  Her preoperative ejection fraction was 40-45%.  She had moderate mitral regurgitation.  Because she was ian  emergency preoperative PFTs and Dopplers were not performed.    Patient subsequently recovered and was doing well until this past month when she developed 2 or 3 episodes of TIA characterized by left-sided numbness and weakness as well as left facial numbness and weakness holding her head up.  No speech or vision changes.  She was seen by Dr. Oneida Alar [facility] and carotid studies CT angiogram and arch angiography demonstrated 90% stenosis at the origin of her innominate artery with a patent old right carotid endarterectomy site.  The lesion was heavily calcified and not amenable to PCI at the origin from the aortic arch. Patient will need a repeat sternotomy for grafting from the ascending aorta to the common carotid artery in order to prevent further neurologic events-stroke.  The patient was placed on Plavix in addition to her aspirin and statin by Dr. Oneida Alar and has had no more TIA events in the past 3 weeks. Past  Medical History:  Diagnosis Date  . Asthma   . COPD (chronic obstructive pulmonary disease) ([place])   . GERD (gastroesophageal reflux disease)   . Shortness of breath dyspnea     Past Surgical History:  Procedure Laterality Date  . AORTIC ARCH ANGIOGRAPHY N/A 11/04/2017   Procedure: AORTIC ARCH ANGIOGRAPHY;  Surgeon: Elam Dutch, MD;  Location: [facility];  Service: Cardiovascular;  Laterality: N/A;  . CHOLECYSTECTOMY    . COLONOSCOPY N/A 10/03/2014   Procedure: COLONOSCOPY;  Surgeon: Rogene Houston, MD;  [facility];  Service: Endoscopy;  Laterality: N/A;  730  . CORONARY ARTERY BYPASS GRAFT N/A 12/14/2016   Procedure: CORONARY ARTERY BYPASS GRAFTING (CABG) x three , using left internal mammary artery and right leg greater saphenous vein harvested endoscopically;  Surgeon: Ivin Poot, MD;  Location: [facility];  Service: Open Heart Surgery;  Laterality: N/A;  . ENDARTERECTOMY Right 12/06/2014   Procedure: ENDARTERECTOMY CAROTid;  Surgeon: Elam Dutch, MD;  Location: [facility];  Service: Vascular;  Laterality: Right;  . ESOPHAGOGASTRODUODENOSCOPY N/A 12/03/2016   Procedure: ESOPHAGOGASTRODUODENOSCOPY (EGD);  Surgeon: Rogene Houston, MD;  [facility];  Service: Endoscopy;  Laterality: N/A;  7:30  . LEFT HEART CATH AND CORONARY ANGIOGRAPHY N/A 12/14/2016   Procedure: LEFT HEART CATH AND CORONARY ANGIOGRAPHY;  Surgeon: Burnell Blanks, MD;  Location: [facility];  Service: Cardiovascular;  Laterality: N/A;  . PERIPHERAL VASCULAR CATHETERIZATION N/A 12/05/2014   Procedure:  Carotid Arch Angiography;  Surgeon: Conrad Franklin, MD;  Location: [facility];  Service: Cardiovascular;  Laterality: N/A;  . TEE WITHOUT CARDIOVERSION N/A 12/14/2016   Procedure: TRANSESOPHAGEAL ECHOCARDIOGRAM (TEE);  Surgeon: Prescott Gum, Collier Salina, MD;  Location: [facility];  Service: Open Heart Surgery;  Laterality: N/A;    Family History  Problem Relation Age of Onset   . Heart attack Mother   . Cancer Mother   . Diabetes Mother   . Heart disease Mother   . Heart attack Father   . Heart disease Father     Social History Social History   Tobacco Use  . Smoking status: Former Smoker    Packs/day: 2.00    Years: 40.00    Pack years: 80.00    Last attempt to quit: 10/03/2010    Years since quitting: 7.1  . Smokeless tobacco: Never Used  . Tobacco comment: No longer using the Vapor  Substance Use Topics  . Alcohol use: No    Alcohol/week: 0.0 standard drinks  . Drug use: No    Current Outpatient Medications  Medication Sig Dispense Refill  . ALPRAZolam (XANAX) 1 MG tablet Take 1 mg by mouth 3 (three) times daily as needed for anxiety.     Marland Kitchen aspirin 81 MG chewable tablet Chew 81 mg by mouth daily.    Marland Kitchen atorvastatin (LIPITOR) 40 MG tablet Take 1 tablet (40 mg total) daily by mouth. 30 tablet 1  . Cholecalciferol (VITAMIN D) 2000 units CAPS Take 2,000 Units by mouth daily.    . clopidogrel (PLAVIX) 75 MG tablet Take 1 tablet (75 mg total) by mouth daily. 30 tablet 11  . metoprolol succinate (TOPROL-XL) 50 MG 24 hr tablet Take 50 mg by mouth daily.  2  . mometasone-formoterol (DULERA) 100-5 MCG/ACT AERO Inhale 2 puffs 2 (two) times daily into the lungs. (Patient not taking: Reported on 10/31/2017) 1 Inhaler 1  . Multiple Vitamin (MULTIVITAMIN WITH MINERALS) TABS tablet Take 1 tablet by mouth daily.    . pantoprazole (PROTONIX) 40 MG tablet Take 40 mg by mouth daily.    . potassium chloride SA (K-DUR,KLOR-CON) 20 MEQ tablet Take 20 mEq by mouth See admin instructions. Take 20 meq by mouth daily. May take additional 20 meq by mouth if needed for cramps  2  . spironolactone (ALDACTONE) 25 MG tablet Take 0.5 tablets (12.5 mg total) at bedtime by mouth. (Patient not taking: Reported on 10/20/2017) 30 tablet 1  . SYMBICORT 160-4.5 MCG/ACT inhaler Inhale 2 puffs into the lungs 2 (two) times daily.   4  . torsemide (DEMADEX) 20 MG tablet Take 2 tablets (40 mg  total) by mouth daily. 120 tablet 1  . VENTOLIN HFA 108 (90 BASE) MCG/ACT inhaler Inhale 2 puffs into the lungs every 6 (six) hours as needed for wheezing or shortness of breath.   0   No current facility-administered medications for this visit.     No Known Allergies  Review of Systems  She has gained weight since she stopped smoking last year She has mild shortness of breath Walking down the hallway in the office her oxygen saturation does not drop and remains at 97-98% on room air. No hemoptysis No bone pain headache difficulty swallowing or change in vision  BP 124/76   Pulse 84   Resp 20   Ht 5\' 6"  (1.676 m)   Wt 185 lb (83.9 kg)   SpO2 97% Comment: RA  BMI 29.86 kg/m  Physical Exam  Exam    General- alert and comfortable    Neck- no JVD, no cervical adenopathy palpable, no carotid bruit   Lungs-scattered rhonchi bilaterally.  Poor bedside mechanics of breathing   Cor- regular rate and rhythm, no murmur , gallop.  Well-healed sternal incision.   Abdomen- soft, non-tender   Extremities - warm, non-tender, minimal edema   Neuro- oriented, appropriate, no focal weakness   Diagnostic Tests: CT scan images shared with patient showing a right upper lobe nodule 11 mm with hypermetabolic activity.  No mediastinal nodal activity or distant metastatic disease noted.   Impression: Patient is at increased risk for stroke due to severe innominate artery stenosis.  She will require redo sternotomy and placement of graft from the ascending aorta to the right carotid.  She has a probable early stage right upper lobe lung cancer however she is a poor candidate for lobectomy because of her underlying pulmonary disease and would not have the pulmonary reserve.  I feel that a SBRT therapy would provide her the same therapeutic benefit as wedge resection at much less risk and will allow her to proceed with repair of her vascular disease much sooner as her recovery would be much  shorter.  I have ordered formal pulmonary function testing and repeat echocardiogram to assess her pulmonary and cardiac reserve.  I have discussed a transthoracic needle biopsy of the right upper lobe 11 mm nodule with Dr. Owens Shark, interventional radiology.  He feels that this is a difficult biopsy with difficult access.  Because of the difficulty obtaining tissue it is his recommendation to have the patient evaluated for  empiric SBRT without tissue sample. Plan: Patient return after PET scan and formal PFTs.  I will ask for her situation to be reviewed by radiation oncology to assess for empiric SBRT without biopsy.  I am concerned that the redo sternotomy and aortic arch revascularization will result in a prolonged recovery during which she would not be able to tolerate any sort of radiation therapy for possibly several months, and for that reason this early lung cancer should be treated first.   Len Childs, MD [facility] [phone]

## 2017-11-15 ENCOUNTER — Encounter: Payer: BLUE CROSS/BLUE SHIELD | Admitting: Cardiothoracic Surgery

## 2017-11-23 ENCOUNTER — Ambulatory Visit (HOSPITAL_COMMUNITY)
Admission: RE | Admit: 2017-11-23 | Discharge: 2017-11-23 | Disposition: A | Discharge status: Home / Self Care | Payer: BLUE CROSS/BLUE SHIELD | Source: Ambulatory Visit | Attending: Cardiothoracic Surgery | Admitting: Cardiothoracic Surgery

## 2017-11-23 DIAGNOSIS — Z951 Presence of aortocoronary bypass graft: Secondary | ICD-10-CM | POA: Diagnosis present

## 2017-11-23 DIAGNOSIS — I082 Rheumatic disorders of both aortic and tricuspid valves: Secondary | ICD-10-CM | POA: Insufficient documentation

## 2017-11-23 DIAGNOSIS — I252 Old myocardial infarction: Secondary | ICD-10-CM | POA: Diagnosis not present

## 2017-11-23 DIAGNOSIS — I361 Nonrheumatic tricuspid (valve) insufficiency: Secondary | ICD-10-CM

## 2017-11-23 DIAGNOSIS — I2510 Atherosclerotic heart disease of native coronary artery without angina pectoris: Secondary | ICD-10-CM | POA: Diagnosis not present

## 2017-11-23 DIAGNOSIS — Z87891 Personal history of nicotine dependence: Secondary | ICD-10-CM | POA: Diagnosis not present

## 2017-11-23 DIAGNOSIS — R0600 Dyspnea, unspecified: Secondary | ICD-10-CM | POA: Diagnosis not present

## 2017-11-23 DIAGNOSIS — J449 Chronic obstructive pulmonary disease, unspecified: Secondary | ICD-10-CM | POA: Insufficient documentation

## 2017-11-23 NOTE — Progress Notes (Signed)
  Echocardiogram 2D Echocardiogram has been performed.  [name] 11/23/2017, 4:00 PM

## 2017-11-24 ENCOUNTER — Ambulatory Visit: Payer: BLUE CROSS/BLUE SHIELD | Admitting: Cardiology

## 2017-11-30 ENCOUNTER — Ambulatory Visit (HOSPITAL_COMMUNITY)
Admission: RE | Admit: 2017-11-30 | Discharge: 2017-11-30 | Disposition: A | Discharge status: Home / Self Care | Payer: BLUE CROSS/BLUE SHIELD | Source: Ambulatory Visit | Attending: Cardiothoracic Surgery | Admitting: Cardiothoracic Surgery

## 2017-11-30 DIAGNOSIS — Z87891 Personal history of nicotine dependence: Secondary | ICD-10-CM | POA: Diagnosis not present

## 2017-11-30 DIAGNOSIS — R942 Abnormal results of pulmonary function studies: Secondary | ICD-10-CM | POA: Diagnosis not present

## 2017-11-30 DIAGNOSIS — Z79899 Other long term (current) drug therapy: Secondary | ICD-10-CM | POA: Diagnosis not present

## 2017-11-30 DIAGNOSIS — R918 Other nonspecific abnormal finding of lung field: Secondary | ICD-10-CM | POA: Insufficient documentation

## 2017-11-30 LAB — PULMONARY FUNCTION TEST
DL/VA % pred: 74 %
DL/VA: 3.76 ml/min/mmHg/L
DLCO unc % pred: 44 %
DLCO unc: 12.11 ml/min/mmHg
FEF 25-75 Pre: 0.65 L/sec
FEF2575-%Pred-Pre: 28 %
FEV1-%Pred-Pre: 46 %
FEV1-Pre: 1.23 L
FEV1FVC-%Pred-Pre: 84 %
FEV6-%Pred-Pre: 56 %
FEV6-Pre: 1.87 L
FEV6FVC-%Pred-Pre: 103 %
FVC-%Pred-Pre: 54 %
FVC-Pre: 1.88 L
Pre FEV1/FVC ratio: 65 %
Pre FEV6/FVC Ratio: 99 %
RV % pred: 94 %
RV: 2.05 L
TLC % pred: 82 %
TLC: 4.42 L

## 2017-11-30 MED ORDER — ALBUTEROL SULFATE (2.5 MG/3ML) 0.083% IN NEBU: 2.5000 mg | INHALATION_SOLUTION | Freq: Once | RESPIRATORY_TRACT | Status: DC

## 2017-12-07 ENCOUNTER — Ambulatory Visit: Payer: BLUE CROSS/BLUE SHIELD | Admitting: Cardiothoracic Surgery

## 2017-12-07 ENCOUNTER — Encounter: Payer: Self-pay | Admitting: Cardiothoracic Surgery

## 2017-12-07 ENCOUNTER — Other Ambulatory Visit: Payer: Self-pay

## 2017-12-07 VITALS — BP 106/64 | HR 88 | Resp 16 | Ht 66.0 in | Wt 185.0 lb

## 2017-12-07 DIAGNOSIS — I6521 Occlusion and stenosis of right carotid artery: Secondary | ICD-10-CM

## 2017-12-07 DIAGNOSIS — Z9889 Other specified postprocedural states: Secondary | ICD-10-CM

## 2017-12-07 DIAGNOSIS — R918 Other nonspecific abnormal finding of lung field: Secondary | ICD-10-CM

## 2017-12-07 NOTE — Progress Notes (Signed)
PCP is Glenda Chroman, MD Referring Provider is Elam Dutch, MD  Chief Complaint  Patient presents with  . Follow-up    2 wk after ECHO 10/9 and PFT 11/30/17 to assess for future surgery    HPI: Patient returns for scheduled follow-up after having echocardiogram and PET scan.  The 1 cm nodule at the right apex is hypermetabolic with SUV 31.5.  Mediastinal nodes are not involved.  No evidence of distant metastatic involvement.  Echocardiogram shows normal LV systolic function, no significant valvular disease after emergency CABG for cardiogenic shock and left main stenosis 1 year ago.  Pulmonary function test show moderate-severe COPD with FEV1 1.2 and diffusion capacity DLCO 44% predicted.  Since starting Plavix the patient has had no more TIAs.  I personally reviewed the patient's CT scans PET scan and echocardiogram images.  She is at greatest risk for stroke from her right common carotid origin tight stenosis.  We will proceed with redo sternotomy and vascular graft from the ascending aorta to the internal carotid in collaboration with Dr. [name].  After the graft is placed in the patient is stable plan will be to do a wedge resection of the right upper lobe nodule which is probably an early lung cancer.  The patient will be scheduled for surgery then stop the Plavix 4 days prior to surgery.  If she develops any symptoms of recurrent TIA she will need admission for IV Aggrastat as a bridge to surgery.  She understands that she is at risk for stroke as well as postoperative pulmonary problems due to her moderate to severe COPD including prolonged ventilation, air leak, pneumonia, oxygen requirement.  Surgery will be scheduled in the next 1-2 weeks.   Past Medical History:  Diagnosis Date  . Asthma   . COPD (chronic obstructive pulmonary disease) ([place])   . GERD (gastroesophageal reflux disease)   . Shortness of breath dyspnea     Past Surgical History:  Procedure  Laterality Date  . AORTIC ARCH ANGIOGRAPHY N/A 11/04/2017   Procedure: AORTIC ARCH ANGIOGRAPHY;  Surgeon: Elam Dutch, MD;  Location: [facility];  Service: Cardiovascular;  Laterality: N/A;  . CHOLECYSTECTOMY    . COLONOSCOPY N/A 10/03/2014   Procedure: COLONOSCOPY;  Surgeon: Rogene Houston, MD;  [facility];  Service: Endoscopy;  Laterality: N/A;  730  . CORONARY ARTERY BYPASS GRAFT N/A 12/14/2016   Procedure: CORONARY ARTERY BYPASS GRAFTING (CABG) x three , using left internal mammary artery and right leg greater saphenous vein harvested endoscopically;  Surgeon: Ivin Poot, MD;  Location: [facility];  Service: Open Heart Surgery;  Laterality: N/A;  . ENDARTERECTOMY Right 12/06/2014   Procedure: ENDARTERECTOMY CAROTid;  Surgeon: Elam Dutch, MD;  Location: [facility];  Service: Vascular;  Laterality: Right;  . ESOPHAGOGASTRODUODENOSCOPY N/A 12/03/2016   Procedure: ESOPHAGOGASTRODUODENOSCOPY (EGD);  Surgeon: Rogene Houston, MD;  [facility];  Service: Endoscopy;  Laterality: N/A;  7:30  . LEFT HEART CATH AND CORONARY ANGIOGRAPHY N/A 12/14/2016   Procedure: LEFT HEART CATH AND CORONARY ANGIOGRAPHY;  Surgeon: Burnell Blanks, MD;  Location: [facility];  Service: Cardiovascular;  Laterality: N/A;  . PERIPHERAL VASCULAR CATHETERIZATION N/A 12/05/2014   Procedure:  Carotid Arch Angiography;  Surgeon: Conrad Superior, MD;  Location: [facility];  Service: Cardiovascular;  Laterality: N/A;  . TEE WITHOUT CARDIOVERSION N/A 12/14/2016   Procedure: TRANSESOPHAGEAL ECHOCARDIOGRAM (TEE);  Surgeon: Prescott Gum, Collier Salina, MD;  Location: [facility];  Service: Open Heart Surgery;  Laterality: N/A;    Family History  Problem Relation Age of Onset  . Heart attack Mother   . Cancer Mother   . Diabetes Mother   . Heart disease Mother   . Heart attack Father   . Heart disease Father     Social History Social History   Tobacco Use  . Smoking status:  Former Smoker    Packs/day: 2.00    Years: 40.00    Pack years: 80.00    Last attempt to quit: 10/03/2010    Years since quitting: 7.1  . Smokeless tobacco: Never Used  . Tobacco comment: No longer using the Vapor  Substance Use Topics  . Alcohol use: No    Alcohol/week: 0.0 standard drinks  . Drug use: No    Current Outpatient Medications  Medication Sig Dispense Refill  . ALPRAZolam (XANAX) 1 MG tablet Take 1 mg by mouth 3 (three) times daily as needed for anxiety.     Marland Kitchen aspirin 81 MG chewable tablet Chew 81 mg by mouth daily.    Marland Kitchen atorvastatin (LIPITOR) 40 MG tablet Take 1 tablet (40 mg total) daily by mouth. 30 tablet 1  . Cholecalciferol (VITAMIN D) 2000 units CAPS Take 2,000 Units by mouth daily.    . Multiple Vitamin (MULTIVITAMIN WITH MINERALS) TABS tablet Take 1 tablet by mouth daily.    . pantoprazole (PROTONIX) 40 MG tablet Take 40 mg by mouth daily.    . potassium chloride SA (K-DUR,KLOR-CON) 20 MEQ tablet Take 20 mEq by mouth See admin instructions. Take 20 meq by mouth daily. May take additional 20 meq by mouth if needed for cramps  2  . SYMBICORT 160-4.5 MCG/ACT inhaler Inhale 2 puffs into the lungs 2 (two) times daily.   4  . torsemide (DEMADEX) 20 MG tablet Take 2 tablets (40 mg total) by mouth daily. 120 tablet 1  . VENTOLIN HFA 108 (90 BASE) MCG/ACT inhaler Inhale 2 puffs into the lungs every 6 (six) hours as needed for wheezing or shortness of breath.   0   No current facility-administered medications for this visit.     No Known Allergies  Review of Systems   No change since her initial consultation specifically no more recurrent TIAs. Patient will be getting a flu shot tomorrow. No angina or shortness of breath with exertion.  BP 106/64 (BP Location: Left Arm, Patient Position: Sitting, Cuff Size: Large)   Pulse 88   Resp 16   Ht 5\' 6"  (1.676 m)   Wt 185 lb (83.9 kg)   SpO2 98% Comment: ON RA  BMI 29.86 kg/m  Physical Exam      Exam    General-  alert and comfortable    Neck- no JVD, no cervical adenopathy palpable, no carotid bruit   Lungs- clear without rales, wheezes   Cor- regular rate and rhythm, no murmur , gallop   Abdomen- soft, non-tender   Extremities - warm, non-tender, minimal edema   Neuro- oriented, appropriate, no focal weakness   Diagnostic Tests: Echocardiogram images and PET scan images personally reviewed and counseled with patient  Impression: Tight stenosis at the origin of the right common carotid artery with multiple recent TIAs. 1.1 cm hypermetabolic nodule at the right lung apex probable early lung cancer Status post emergency CABG x3 for tight left main stenosis and cardiogenic shock October 2018  Plan: Patient will be admitted for vascular reconstruction of her right carotid artery  the bypass graft from the ascending aorta to the right internal carotid.  This will be followed by wedge resection of the right apical lung nodule through sternotomy.  Len Childs, MD [facility] [phone]

## 2017-12-08 ENCOUNTER — Other Ambulatory Visit: Payer: Self-pay | Admitting: *Deleted

## 2017-12-08 ENCOUNTER — Encounter: Payer: Self-pay | Admitting: *Deleted

## 2017-12-08 DIAGNOSIS — R911 Solitary pulmonary nodule: Secondary | ICD-10-CM

## 2017-12-13 ENCOUNTER — Other Ambulatory Visit: Payer: Self-pay | Admitting: *Deleted

## 2017-12-14 ENCOUNTER — Other Ambulatory Visit: Payer: Self-pay | Admitting: *Deleted

## 2017-12-22 ENCOUNTER — Encounter: Payer: Self-pay | Admitting: Vascular Surgery

## 2017-12-22 ENCOUNTER — Encounter (HOSPITAL_COMMUNITY): Payer: Self-pay

## 2017-12-22 ENCOUNTER — Ambulatory Visit: Payer: BLUE CROSS/BLUE SHIELD | Admitting: Vascular Surgery

## 2017-12-22 ENCOUNTER — Ambulatory Visit (HOSPITAL_COMMUNITY)
Admission: RE | Admit: 2017-12-22 | Discharge: 2017-12-22 | Disposition: A | Discharge status: Home / Self Care | Payer: BLUE CROSS/BLUE SHIELD | Source: Ambulatory Visit | Attending: Cardiothoracic Surgery | Admitting: Cardiothoracic Surgery

## 2017-12-22 ENCOUNTER — Encounter (HOSPITAL_COMMUNITY)
Admission: RE | Admit: 2017-12-22 | Discharge: 2017-12-22 | Disposition: A | Discharge status: Home / Self Care | Payer: BLUE CROSS/BLUE SHIELD | Source: Ambulatory Visit | Attending: Cardiothoracic Surgery | Admitting: Cardiothoracic Surgery

## 2017-12-22 ENCOUNTER — Other Ambulatory Visit: Payer: Self-pay

## 2017-12-22 VITALS — BP 121/76 | HR 86 | Temp 98.1°F | Resp 18 | Ht 66.0 in | Wt 187.0 lb

## 2017-12-22 DIAGNOSIS — Z7902 Long term (current) use of antithrombotics/antiplatelets: Secondary | ICD-10-CM | POA: Diagnosis not present

## 2017-12-22 DIAGNOSIS — I700 Atherosclerosis of aorta: Secondary | ICD-10-CM | POA: Insufficient documentation

## 2017-12-22 DIAGNOSIS — Z951 Presence of aortocoronary bypass graft: Secondary | ICD-10-CM | POA: Insufficient documentation

## 2017-12-22 DIAGNOSIS — Z79899 Other long term (current) drug therapy: Secondary | ICD-10-CM | POA: Insufficient documentation

## 2017-12-22 DIAGNOSIS — Z01818 Encounter for other preprocedural examination: Secondary | ICD-10-CM | POA: Insufficient documentation

## 2017-12-22 DIAGNOSIS — J449 Chronic obstructive pulmonary disease, unspecified: Secondary | ICD-10-CM | POA: Insufficient documentation

## 2017-12-22 DIAGNOSIS — R911 Solitary pulmonary nodule: Secondary | ICD-10-CM | POA: Insufficient documentation

## 2017-12-22 DIAGNOSIS — I252 Old myocardial infarction: Secondary | ICD-10-CM | POA: Insufficient documentation

## 2017-12-22 DIAGNOSIS — I771 Stricture of artery: Secondary | ICD-10-CM

## 2017-12-22 DIAGNOSIS — Z87891 Personal history of nicotine dependence: Secondary | ICD-10-CM | POA: Diagnosis not present

## 2017-12-22 DIAGNOSIS — K219 Gastro-esophageal reflux disease without esophagitis: Secondary | ICD-10-CM | POA: Diagnosis not present

## 2017-12-22 HISTORY — DX: Pneumonia, unspecified organism: J18.9

## 2017-12-22 HISTORY — DX: Cerebral infarction, unspecified: I63.9

## 2017-12-22 HISTORY — DX: Atherosclerotic heart disease of native coronary artery without angina pectoris: I25.10

## 2017-12-22 LAB — COMPREHENSIVE METABOLIC PANEL
ALT: 21 U/L (ref 0–44)
AST: 19 U/L (ref 15–41)
Albumin: 3.8 g/dL (ref 3.5–5.0)
Alkaline Phosphatase: 92 U/L (ref 38–126)
Anion gap: 10 (ref 5–15)
BUN: 16 mg/dL (ref 8–23)
CO2: 29 mmol/L (ref 22–32)
Calcium: 9.7 mg/dL (ref 8.9–10.3)
Chloride: 101 mmol/L (ref 98–111)
Creatinine, Ser: 0.75 mg/dL (ref 0.44–1.00)
GFR calc Af Amer: >60 mL/min (ref >60)
GFR calc non Af Amer: >60 mL/min (ref >60)
Glucose, Bld: 220 mg/dL — ABNORMAL HIGH (ref 70–99)
Potassium: 3.5 mmol/L (ref 3.5–5.1)
Sodium: 140 mmol/L (ref 135–145)
Total Bilirubin: 0.7 mg/dL (ref 0.3–1.2)
Total Protein: 7 g/dL (ref 6.5–8.1)

## 2017-12-22 LAB — CBC
HCT: 39.8 % (ref 36.0–46.0)
Hemoglobin: 12.7 g/dL (ref 12.0–15.0)
MCH: 28.5 pg (ref 26.0–34.0)
MCHC: 31.9 g/dL (ref 30.0–36.0)
MCV: 89.4 fL (ref 80.0–100.0)
Platelets: 259 10*3/uL (ref 150–400)
RBC: 4.45 MIL/uL (ref 3.87–5.11)
RDW: 12.5 % (ref 11.5–15.5)
WBC: 6.1 10*3/uL (ref 4.0–10.5)
nRBC: 0 % (ref 0.0–0.2)

## 2017-12-22 LAB — SURGICAL PCR SCREEN
MRSA, PCR: NEGATIVE
Staphylococcus aureus: NEGATIVE

## 2017-12-22 LAB — HEMOGLOBIN A1C
HEMOGLOBIN A1C: 6.2 % — AB (ref 4.8–5.6)
Mean Plasma Glucose: 131.24 mg/dL

## 2017-12-22 LAB — APTT: aPTT: 31 seconds (ref 24–36)

## 2017-12-22 LAB — PROTIME-INR
INR: 1
Prothrombin Time: 13.1 seconds (ref 11.4–15.2)

## 2017-12-22 NOTE — Progress Notes (Signed)
Pt unable to get urine sample today, will bring with her the morning of surgery. Pt also unable to get ABG, will get DOS

## 2017-12-22 NOTE — Pre-Procedure Instructions (Signed)
Emily Rocha  12/22/2017      [place] [place] [address] Phone: [phone] Fax: [phone]  [id number] - [place], [address] [place] [address] Phone: [phone] Fax: [phone]    Your procedure is scheduled on Monday November 11.  Report to [facility] Admitting at 5:30 A.M.  Call this number if you have problems the morning of surgery:  [phone]   Remember:  Do not eat or drink after midnight.    Take these medicines the morning of surgery with A SIP OF WATER:   Metoprolol (Toprol-XL) Pantoprazole (Protonix) Symbicort Ventolin Alprazolam (Xanax) if needed  7 days prior to surgery STOP taking any Aleve, Naproxen, Ibuprofen, Motrin, Advil, Goody's, BC's, all herbal medications, fish oil, and all vitamins  FOLLOW your surgeon's instructions on stopping Aspirin and Plavix (Clopidogrel). If no instructions were given, please call your surgeon's office.      Do not wear jewelry, make-up or nail polish.  Do not wear lotions, powders, or perfumes, or deodorant.  Do not shave 48 hours prior to surgery.  Men may shave face and neck.  Do not bring valuables to the hospital.  [facility] is not responsible for any belongings or valuables.  Contacts, dentures or bridgework may not be worn into surgery.  Leave your suitcase in the car.  After surgery it may be brought to your room.  For patients admitted to the hospital, discharge time will be determined by your treatment team.  Patients discharged the day of surgery will not be allowed to drive home.    Special instructions:    [facility]- Preparing For Surgery  Before surgery, you can play an important role. Because skin is not sterile, your skin needs to be as free of germs as possible. You can reduce the number of germs on your skin by washing with CHG (chlorahexidine  gluconate) Soap before surgery.  CHG is an antiseptic cleaner which kills germs and bonds with the skin to continue killing germs even after washing.    Oral Hygiene is also important to reduce your risk of infection.  Remember - BRUSH YOUR TEETH THE MORNING OF SURGERY WITH YOUR REGULAR TOOTHPASTE  Please do not use if you have an allergy to CHG or antibacterial soaps. If your skin becomes reddened/irritated stop using the CHG.  Do not shave (including legs and underarms) for at least 48 hours prior to first CHG shower. It is OK to shave your face.  Please follow these instructions carefully.   1. Shower the NIGHT BEFORE SURGERY and the MORNING OF SURGERY with CHG.   2. If you chose to wash your hair, wash your hair first as usual with your normal shampoo.  3. After you shampoo, rinse your hair and body thoroughly to remove the shampoo.  4. Use CHG as you would any other liquid soap. You can apply CHG directly to the skin and wash gently with a scrungie or a clean washcloth.   5. Apply the CHG Soap to your body ONLY FROM THE NECK DOWN.  Do not use on open wounds or open sores. Avoid contact with your eyes, ears, mouth and genitals (private parts). Wash Face and genitals (private parts)  with your normal soap.  6. Wash thoroughly, paying special attention to the area where your surgery will be performed.  7. Thoroughly rinse  your body with warm water from the neck down.  8. DO NOT shower/wash with your normal soap after using and rinsing off the CHG Soap.  9. Pat yourself dry with a CLEAN TOWEL.  10. Wear CLEAN PAJAMAS to bed the night before surgery, wear comfortable clothes the morning of surgery  11. Place CLEAN SHEETS on your bed the night of your first shower and DO NOT SLEEP WITH PETS.    Day of Surgery:  Do not apply any deodorants/lotions.  Please wear clean clothes to the hospital/surgery center.   Remember to brush your teeth WITH YOUR REGULAR TOOTHPASTE.    Please  read over the following fact sheets that you were given. Coughing and Deep Breathing, MRSA Information and Surgical Site Infection Prevention

## 2017-12-22 NOTE — Progress Notes (Signed)
PCP - Dr Woody Seller Cardiologist - Dr Harl Bowie Pt denies having neurologist  Chest x-ray - 12/22/2017  EKG - 12/22/2017  ECHO - 11/23/17 Cardiac Cath - 11/04/17 (media)  Blood Thinner Instructions: last dose Plavix 12/21/17, pt to continue taking ASA d/t risk for stroke  Anesthesia review: cardiac history, pt reports having multiple "mini strokes" last one October 19, 2017. Pt reports her MD's are aware of these and she knows they happen d/t weakness and numbness to left arm and left leg. Pt reports no strokes since she was started on Plavix.   Patient denies shortness of breath, fever, cough and chest pain at PAT appointment   Patient verbalized understanding of instructions that were given to them at the PAT appointment. Patient was also instructed that they will need to review over the PAT instructions again at home before surgery.

## 2017-12-22 NOTE — Progress Notes (Signed)
Patient is a 61 year old female who returns for follow-up today.  She is scheduled next Monday for a median sternotomy with aorto to right common carotid artery bypass.  She has had no further TIA of a events since September 6 when she started her Plavix.  Is currently on Plavix and aspirin and a statin.  She recently saw Dr. Prescott Gum who will provide exposure through a redo median sternotomy and also do wedge resection of a lung mass simultaneously.  She has had previous right carotid endarterectomy.  Recent angiogram showed high-grade stenosis at the origin of her innominate artery extending into the bifurcation to the subclavian and common carotid.    Review of systems: She denies shortness of breath.  She denies chest pain.  Past Medical History:  Diagnosis Date  . Asthma   . COPD (chronic obstructive pulmonary disease) ([facility])   . Coronary artery disease    hx CABG  . GERD (gastroesophageal reflux disease)   . Pneumonia   . Shortness of breath dyspnea   . Stroke [facility])    "mini stroke" numbness and weakness to left side of the body   Past Surgical History:  Procedure Laterality Date  . AORTIC ARCH ANGIOGRAPHY N/A 11/04/2017   Procedure: AORTIC ARCH ANGIOGRAPHY;  Surgeon: Elam Dutch, MD;  Location: [facility];  Service: Cardiovascular;  Laterality: N/A;  . APPENDECTOMY    . CHOLECYSTECTOMY    . COLONOSCOPY N/A 10/03/2014   Procedure: COLONOSCOPY;  Surgeon: Rogene Houston, MD;  [facility];  Service: Endoscopy;  Laterality: N/A;  730  . CORONARY ARTERY BYPASS GRAFT N/A 12/14/2016   Procedure: CORONARY ARTERY BYPASS GRAFTING (CABG) x three , using left internal mammary artery and right leg greater saphenous vein harvested endoscopically;  Surgeon: Ivin Poot, MD;  Location: [facility];  Service: Open Heart Surgery;  Laterality: N/A;  . ENDARTERECTOMY Right 12/06/2014   Procedure: ENDARTERECTOMY CAROTid;  Surgeon: Elam Dutch, MD;  Location: [facility];  Service:  Vascular;  Laterality: Right;  . ESOPHAGOGASTRODUODENOSCOPY N/A 12/03/2016   Procedure: ESOPHAGOGASTRODUODENOSCOPY (EGD);  Surgeon: Rogene Houston, MD;  [facility];  Service: Endoscopy;  Laterality: N/A;  7:30  . LEFT HEART CATH AND CORONARY ANGIOGRAPHY N/A 12/14/2016   Procedure: LEFT HEART CATH AND CORONARY ANGIOGRAPHY;  Surgeon: Burnell Blanks, MD;  Location: [facility];  Service: Cardiovascular;  Laterality: N/A;  . PERIPHERAL VASCULAR CATHETERIZATION N/A 12/05/2014   Procedure:  Carotid Arch Angiography;  Surgeon: Conrad Naplate, MD;  Location: [facility];  Service: Cardiovascular;  Laterality: N/A;  . TEE WITHOUT CARDIOVERSION N/A 12/14/2016   Procedure: TRANSESOPHAGEAL ECHOCARDIOGRAM (TEE);  Surgeon: Prescott Gum, Collier Salina, MD;  Location: [facility];  Service: Open Heart Surgery;  Laterality: N/A;    Current Outpatient Medications on File Prior to Visit  Medication Sig Dispense Refill  . ALPRAZolam (XANAX) 1 MG tablet Take 1 mg by mouth 3 (three) times daily as needed for anxiety.     Marland Kitchen aspirin 81 MG chewable tablet Chew 81 mg by mouth daily.    Marland Kitchen atorvastatin (LIPITOR) 40 MG tablet Take 1 tablet (40 mg total) daily by mouth. 30 tablet 1  . Cholecalciferol (VITAMIN D) 2000 units CAPS Take 2,000 Units by mouth daily.    . metoprolol succinate (TOPROL-XL) 50 MG 24 hr tablet Take 50 mg by mouth daily. Take with or immediately following a meal.    . Multiple Vitamin (MULTIVITAMIN WITH MINERALS)  TABS tablet Take 1 tablet by mouth daily.    . pantoprazole (PROTONIX) 40 MG tablet Take 40 mg by mouth daily.    . potassium chloride SA (K-DUR,KLOR-CON) 20 MEQ tablet Take 20 mEq by mouth 2 (two) times daily.   2  . SYMBICORT 160-4.5 MCG/ACT inhaler Inhale 2 puffs into the lungs 2 (two) times daily.   4  . torsemide (DEMADEX) 20 MG tablet Take 2 tablets (40 mg total) by mouth daily. 120 tablet 1  . VENTOLIN HFA 108 (90 BASE) MCG/ACT inhaler Inhale 2 puffs into the lungs  every 6 (six) hours as needed for wheezing or shortness of breath.   0  . clopidogrel (PLAVIX) 75 MG tablet Take 75 mg by mouth daily. Holding til after surgery     No current facility-administered medications on file prior to visit.      Physical exam:  Vitals:   12/22/17 1334  BP: 121/76  Pulse: 86  Resp: 18  Temp: 98.1 F (36.7 C)  TempSrc: Oral  SpO2: 93%  Weight: 187 lb (84.8 kg)  Height: 5\' 6"  (1.676 m)    Extremities: Absent radial pulses bilaterally, absent right brachial pulse 2+ left brachial pulse  Neuro: Symmetric upper extremity lower extremity motor strength no facial asymmetry  Neck: Bilateral carotid bruits right greater than left  Data: Arch angiogram carotid duplex scan reviewed as mentioned per history of present illness  Assessment: Symptomatic innominate artery stenosis.  Plan: Patient will undergo redo sternotomy with most likely aorto innominate bypass but potentially bifurcated graft to the common carotid and subclavian origins.  This will be done in combination with Dr. Prescott Gum who is also going to do a lung resection.  Risk benefits possible complications of procedure details were discussed with the patient today including not limited to bleeding infection stroke risk myocardial events pneumonia and pulmonary related complications.  She understands and agrees to proceed.  [name], MD [facility] Office: [phone] Pager: [phone]

## 2017-12-22 NOTE — H&P (View-Only) (Signed)
Patient is a 62 year old female who returns for follow-up today.  She is scheduled next Monday for a median sternotomy with aorto to right common carotid artery bypass.  She has had no further TIA of a events since September 6 when she started her Plavix.  Is currently on Plavix and aspirin and a statin.  She recently saw Dr. Prescott Gum who will provide exposure through a redo median sternotomy and also do wedge resection of a lung mass simultaneously.  She has had previous right carotid endarterectomy.  Recent angiogram showed high-grade stenosis at the origin of her innominate artery extending into the bifurcation to the subclavian and common carotid.    Review of systems: She denies shortness of breath.  She denies chest pain.  Past Medical History:  Diagnosis Date  . Asthma   . COPD (chronic obstructive pulmonary disease) ([facility])   . Coronary artery disease    hx CABG  . GERD (gastroesophageal reflux disease)   . Pneumonia   . Shortness of breath dyspnea   . Stroke [facility])    "mini stroke" numbness and weakness to left side of the body   Past Surgical History:  Procedure Laterality Date  . AORTIC ARCH ANGIOGRAPHY N/A 11/04/2017   Procedure: AORTIC ARCH ANGIOGRAPHY;  Surgeon: Elam Dutch, MD;  Location: [facility];  Service: Cardiovascular;  Laterality: N/A;  . APPENDECTOMY    . CHOLECYSTECTOMY    . COLONOSCOPY N/A 10/03/2014   Procedure: COLONOSCOPY;  Surgeon: Rogene Houston, MD;  [facility];  Service: Endoscopy;  Laterality: N/A;  730  . CORONARY ARTERY BYPASS GRAFT N/A 12/14/2016   Procedure: CORONARY ARTERY BYPASS GRAFTING (CABG) x three , using left internal mammary artery and right leg greater saphenous vein harvested endoscopically;  Surgeon: Ivin Poot, MD;  Location: [facility];  Service: Open Heart Surgery;  Laterality: N/A;  . ENDARTERECTOMY Right 12/06/2014   Procedure: ENDARTERECTOMY CAROTid;  Surgeon: Elam Dutch, MD;  Location: [facility] OR;  Service:  Vascular;  Laterality: Right;  . ESOPHAGOGASTRODUODENOSCOPY N/A 12/03/2016   Procedure: ESOPHAGOGASTRODUODENOSCOPY (EGD);  Surgeon: Rogene Houston, MD;  [facility];  Service: Endoscopy;  Laterality: N/A;  7:30  . LEFT HEART CATH AND CORONARY ANGIOGRAPHY N/A 12/14/2016   Procedure: LEFT HEART CATH AND CORONARY ANGIOGRAPHY;  Surgeon: Burnell Blanks, MD;  Location: [facility];  Service: Cardiovascular;  Laterality: N/A;  . PERIPHERAL VASCULAR CATHETERIZATION N/A 12/05/2014   Procedure:  Carotid Arch Angiography;  Surgeon: Conrad Hodges, MD;  Location: [facility];  Service: Cardiovascular;  Laterality: N/A;  . TEE WITHOUT CARDIOVERSION N/A 12/14/2016   Procedure: TRANSESOPHAGEAL ECHOCARDIOGRAM (TEE);  Surgeon: Prescott Gum, Collier Salina, MD;  Location: [facility];  Service: Open Heart Surgery;  Laterality: N/A;    Current Outpatient Medications on File Prior to Visit  Medication Sig Dispense Refill  . ALPRAZolam (XANAX) 1 MG tablet Take 1 mg by mouth 3 (three) times daily as needed for anxiety.     Marland Kitchen aspirin 81 MG chewable tablet Chew 81 mg by mouth daily.    Marland Kitchen atorvastatin (LIPITOR) 40 MG tablet Take 1 tablet (40 mg total) daily by mouth. 30 tablet 1  . Cholecalciferol (VITAMIN D) 2000 units CAPS Take 2,000 Units by mouth daily.    . metoprolol succinate (TOPROL-XL) 50 MG 24 hr tablet Take 50 mg by mouth daily. Take with or immediately following a meal.    . Multiple Vitamin (MULTIVITAMIN WITH MINERALS)  TABS tablet Take 1 tablet by mouth daily.    . pantoprazole (PROTONIX) 40 MG tablet Take 40 mg by mouth daily.    . potassium chloride SA (K-DUR,KLOR-CON) 20 MEQ tablet Take 20 mEq by mouth 2 (two) times daily.   2  . SYMBICORT 160-4.5 MCG/ACT inhaler Inhale 2 puffs into the lungs 2 (two) times daily.   4  . torsemide (DEMADEX) 20 MG tablet Take 2 tablets (40 mg total) by mouth daily. 120 tablet 1  . VENTOLIN HFA 108 (90 BASE) MCG/ACT inhaler Inhale 2 puffs into the lungs  every 6 (six) hours as needed for wheezing or shortness of breath.   0  . clopidogrel (PLAVIX) 75 MG tablet Take 75 mg by mouth daily. Holding til after surgery     No current facility-administered medications on file prior to visit.      Physical exam:  Vitals:   12/22/17 1334  BP: 121/76  Pulse: 86  Resp: 18  Temp: 98.1 F (36.7 C)  TempSrc: Oral  SpO2: 93%  Weight: 187 lb (84.8 kg)  Height: 5\' 6"  (1.676 m)    Extremities: Absent radial pulses bilaterally, absent right brachial pulse 2+ left brachial pulse  Neuro: Symmetric upper extremity lower extremity motor strength no facial asymmetry  Neck: Bilateral carotid bruits right greater than left  Data: Arch angiogram carotid duplex scan reviewed as mentioned per history of present illness  Assessment: Symptomatic innominate artery stenosis.  Plan: Patient will undergo redo sternotomy with most likely aorto innominate bypass but potentially bifurcated graft to the common carotid and subclavian origins.  This will be done in combination with Dr. Prescott Gum who is also going to do a lung resection.  Risk benefits possible complications of procedure details were discussed with the patient today including not limited to bleeding infection stroke risk myocardial events pneumonia and pulmonary related complications.  She understands and agrees to proceed.  [name], MD [facility] Office: [phone] Pager: [phone]

## 2017-12-23 NOTE — Anesthesia Preprocedure Evaluation (Addendum)
Anesthesia Evaluation  Patient identified by MRN, date of birth, ID band Patient awake    Reviewed: Allergy & Precautions, NPO status , Patient's Chart, lab work & pertinent test results, reviewed documented beta blocker date and time   Airway Mallampati: I  TM Distance: <3 FB Neck ROM: Full    Dental  (+) Edentulous Upper, Edentulous Lower   Pulmonary asthma , COPD, former smoker,     + decreased breath sounds      Cardiovascular + CAD and + CABG   Rhythm:Regular Rate:Normal     Neuro/Psych CVA    GI/Hepatic Neg liver ROS, GERD  Medicated,  Endo/Other  negative endocrine ROS  Renal/GU negative Renal ROS     Musculoskeletal   Abdominal (+) + obese,   Peds  Hematology   Anesthesia Other Findings   Reproductive/Obstetrics                           Lab Results  Component Value Date   WBC 6.1 12/22/2017   HGB 12.7 12/22/2017   HCT 39.8 12/22/2017   MCV 89.4 12/22/2017   PLT 259 12/22/2017   Lab Results  Component Value Date   CREATININE 0.75 12/22/2017   BUN 16 12/22/2017   NA 140 12/22/2017   K 3.5 12/22/2017   CL 101 12/22/2017   CO2 29 12/22/2017   Lab Results  Component Value Date   INR 1.00 12/22/2017   INR 1.40 12/14/2016   INR 1.01 12/12/2016   Echo: - Left ventricle: The cavity size was normal. There was mild   concentric hypertrophy. Systolic function was normal. Wall motion   was normal; there were no regional wall motion abnormalities.   Doppler parameters are consistent with abnormal left ventricular   relaxation (grade 1 diastolic dysfunction). Doppler parameters   are consistent with high ventricular filling pressure. - Aortic valve: Valve mobility was restricted. There was mild   stenosis. There was mild regurgitation. Peak velocity (S): 288   cm/s. Mean gradient (S): 17 mm Hg. Valve area (VTI): 0.83 cm^2.   Valve area (Vmax): 0.85 cm^2. Valve area (Vmean): 0.87  cm^2. - Mitral valve: Transvalvular velocity was within the normal range.   There was no evidence for stenosis. There was trivial   regurgitation. - Right ventricle: The cavity size was normal. Wall thickness was   normal. Systolic function was normal. - Atrial septum: No defect or patent foramen ovale was identified. - Tricuspid valve: There was mild regurgitation. - Pulmonary arteries: PA peak pressure: 27 mm Hg (S).  Anesthesia Physical Anesthesia Plan  ASA: IV  Anesthesia Plan: General   Post-op Pain Management:    Induction: Intravenous  PONV Risk Score and Plan: 4 or greater and Ondansetron and Treatment may vary due to age or medical condition  Airway Management Planned: Double Lumen EBT  Additional Equipment: Arterial line, CVP, TEE and Ultrasound Guidance Line Placement  Intra-op Plan:   Post-operative Plan: Possible Post-op intubation/ventilation  Informed Consent: I have reviewed the patients History and Physical, chart, labs and discussed the procedure including the risks, benefits and alternatives for the proposed anesthesia with the patient or authorized representative who has indicated his/her understanding and acceptance.   Dental advisory given  Plan Discussed with: CRNA  Anesthesia Plan Comments: (See PAT note 12/22/2017 by Karoline Caldwell, PA-C )      Anesthesia Quick Evaluation

## 2017-12-23 NOTE — Progress Notes (Signed)
Anesthesia Chart Review:  Case:  [id number] Date/Time:  12/26/17 0715   Procedures:      REDO STERNOTOMY (N/A )     WEDGE RESECTION RUL NODULE (Right )     AORTIC TO RIGHT CAROTID  ARTERY  BYPASS (Right )   Anesthesia type:  General   Pre-op diagnosis:  Right lung nodule   Location:  [facility] OR ROOM 15 / [place] OR   Surgeon:  Ivin Poot, MD; Elam Dutch, MD      DISCUSSION: 62 yo female former smoker. Pertinent hx includes COPD (mod-severe), GERD, Stroke, CABG x 3 in 2018, Right CEA 2016.  s/p CABG after presenting 11/2016 with NSTEMI. Presentation complicated by cardiogenic shock. Echo 12/2016 LVEF 45-50%. Completed cardiac rehab. Repeat echo 11/2017 shows normal LV systolic function, no significant valvular disease.  Pulmonary function test show moderate-severe COPD with FEV1 1.2 and diffusion capacity DLCO 44% predicted.  Per Dr. Lucianne Lei Trigt's note she will stop Plavix 4 days prior to surgery  Anticipate she can proceed as planned barring acute status change.  VS: BP 112/61   Pulse 99   Temp 36.6 C   Resp 18   Ht 5\' 4"  (1.626 m)   Wt 85.1 kg   SpO2 96%   BMI 32.20 kg/m   PROVIDERS: Glenda Chroman, MD  Carlyle Dolly, MD is Cardiologist  LABS: Labs reviewed: Acceptable for surgery. (all labs ordered are listed, but only abnormal results are displayed)  Labs Reviewed  COMPREHENSIVE METABOLIC PANEL - Abnormal; Notable for the following components:      Result Value   Glucose, Bld 220 (*)    All other components within normal limits  HEMOGLOBIN A1C - Abnormal; Notable for the following components:   Hgb A1c MFr Bld 6.2 (*)    All other components within normal limits  SURGICAL PCR SCREEN  APTT  CBC  PROTIME-INR  BLOOD GAS, ARTERIAL  TYPE AND SCREEN     IMAGES: CHEST - 2 VIEW 12/22/2017:  COMPARISON:  01/19/2017 and earlier.  FINDINGS: Stable and normal mediastinal contours status post CABG. Stable lung volumes and ventilation with no pneumothorax,  pleural effusion, pulmonary edema, or confluent pulmonary opacity. Visualized tracheal air column is within normal limits. Abdominal Calcified aortic atherosclerosis. Stable cholecystectomy clips. Chronic small gastric hiatal hernia. Negative visible bowel gas pattern.  IMPRESSION: 1. No acute cardiopulmonary abnormality. 2. Small chronic hiatal hernia. 3.  Aortic Atherosclerosis (ICD10-I70.0).  Aortic arch angiography 11/04/2017: Procedure: Arch aortogram, ultrasound right groin  Preoperative diagnosis: Symptomatic right carotid stenosis  Postoperative diagnosis: Same  Anesthesia: Local with IV sedation  Operative findings: #1 type I aortic arch #2 severe innominate and origin stenosis right common carotid right subclavian arteries  2.  50% stenosis left common carotid origin severe calcification  EKG: 12/22/2017: Normal sinus rhythm with sinus arrhythmia. Nonspecific ST and T wave abnormality.  CV: Study Conclusions - Left ventricle: The cavity size was normal. There was mild   concentric hypertrophy. Systolic function was normal. Wall motion   was normal; there were no regional wall motion abnormalities.   Doppler parameters are consistent with abnormal left ventricular   relaxation (grade 1 diastolic dysfunction). Doppler parameters   are consistent with high ventricular filling pressure. - Aortic valve: Valve mobility was restricted. There was mild   stenosis. There was mild regurgitation. Peak velocity (S): 288   cm/s. Mean gradient (S): 17 mm Hg. Valve area (VTI): 0.83 cm^2.   Valve area (Vmax): 0.85 cm^2. Valve  area (Vmean): 0.87 cm^2. - Mitral valve: Transvalvular velocity was within the normal range.   There was no evidence for stenosis. There was trivial   regurgitation. - Right ventricle: The cavity size was normal. Wall thickness was   normal. Systolic function was normal. - Atrial septum: No defect or patent foramen ovale was identified. - Tricuspid  valve: There was mild regurgitation. - Pulmonary arteries: PA peak pressure: 27 mm Hg (S).  Past Medical History:  Diagnosis Date  . Asthma   . COPD (chronic obstructive pulmonary disease) ([place])   . Coronary artery disease    hx CABG  . GERD (gastroesophageal reflux disease)   . Pneumonia   . Shortness of breath dyspnea   . Stroke [facility])    "mini stroke" numbness and weakness to left side of the body    Past Surgical History:  Procedure Laterality Date  . AORTIC ARCH ANGIOGRAPHY N/A 11/04/2017   Procedure: AORTIC ARCH ANGIOGRAPHY;  Surgeon: Elam Dutch, MD;  Location: [place] CV LAB;  Service: Cardiovascular;  Laterality: N/A;  . APPENDECTOMY    . CHOLECYSTECTOMY    . COLONOSCOPY N/A 10/03/2014   Procedure: COLONOSCOPY;  Surgeon: Rogene Houston, MD;  [facility];  Service: Endoscopy;  Laterality: N/A;  730  . CORONARY ARTERY BYPASS GRAFT N/A 12/14/2016   Procedure: CORONARY ARTERY BYPASS GRAFTING (CABG) x three , using left internal mammary artery and right leg greater saphenous vein harvested endoscopically;  Surgeon: Ivin Poot, MD;  Location: [place];  Service: Open Heart Surgery;  Laterality: N/A;  . ENDARTERECTOMY Right 12/06/2014   Procedure: ENDARTERECTOMY CAROTid;  Surgeon: Elam Dutch, MD;  Location: [facility] OR;  Service: Vascular;  Laterality: Right;  . ESOPHAGOGASTRODUODENOSCOPY N/A 12/03/2016   Procedure: ESOPHAGOGASTRODUODENOSCOPY (EGD);  Surgeon: Rogene Houston, MD;  [facility];  Service: Endoscopy;  Laterality: N/A;  7:30  . LEFT HEART CATH AND CORONARY ANGIOGRAPHY N/A 12/14/2016   Procedure: LEFT HEART CATH AND CORONARY ANGIOGRAPHY;  Surgeon: Burnell Blanks, MD;  Location: [place] CV LAB;  Service: Cardiovascular;  Laterality: N/A;  . PERIPHERAL VASCULAR CATHETERIZATION N/A 12/05/2014   Procedure:  Carotid Arch Angiography;  Surgeon: Conrad Rivanna, MD;  Location: [place] CV LAB;  Service: Cardiovascular;   Laterality: N/A;  . TEE WITHOUT CARDIOVERSION N/A 12/14/2016   Procedure: TRANSESOPHAGEAL ECHOCARDIOGRAM (TEE);  Surgeon: Prescott Gum, Collier Salina, MD;  Location: [place];  Service: Open Heart Surgery;  Laterality: N/A;    MEDICATIONS: . ALPRAZolam (XANAX) 1 MG tablet  . aspirin 81 MG chewable tablet  . atorvastatin (LIPITOR) 40 MG tablet  . Cholecalciferol (VITAMIN D) 2000 units CAPS  . clopidogrel (PLAVIX) 75 MG tablet  . metoprolol succinate (TOPROL-XL) 50 MG 24 hr tablet  . Multiple Vitamin (MULTIVITAMIN WITH MINERALS) TABS tablet  . pantoprazole (PROTONIX) 40 MG tablet  . potassium chloride SA (K-DUR,KLOR-CON) 20 MEQ tablet  . SYMBICORT 160-4.5 MCG/ACT inhaler  . torsemide (DEMADEX) 20 MG tablet  . VENTOLIN HFA 108 (90 BASE) MCG/ACT inhaler   No current facility-administered medications for this encounter.     Wynonia Musty [facility] Short Stay Center/Anesthesiology Phone [phone] 12/23/2017 9:25 AM

## 2017-12-25 ENCOUNTER — Encounter (HOSPITAL_COMMUNITY): Payer: Self-pay | Admitting: Anesthesiology

## 2017-12-26 ENCOUNTER — Inpatient Hospital Stay (HOSPITAL_COMMUNITY): Payer: BLUE CROSS/BLUE SHIELD | Admitting: Physician Assistant

## 2017-12-26 ENCOUNTER — Encounter (HOSPITAL_COMMUNITY): Payer: Self-pay | Admitting: Certified Registered"

## 2017-12-26 ENCOUNTER — Inpatient Hospital Stay (HOSPITAL_COMMUNITY)
Admission: RE | Admit: 2017-12-26 | Discharge: 2018-01-13 | DRG: 037 | Disposition: A | Discharge status: Home Health | Payer: BLUE CROSS/BLUE SHIELD | Attending: Cardiothoracic Surgery | Admitting: Cardiothoracic Surgery

## 2017-12-26 ENCOUNTER — Inpatient Hospital Stay (HOSPITAL_COMMUNITY)
Admission: RE | Disposition: A | Discharge status: Home Health | Payer: Self-pay | Source: Home / Self Care | Attending: Cardiothoracic Surgery

## 2017-12-26 ENCOUNTER — Inpatient Hospital Stay (HOSPITAL_COMMUNITY): Payer: BLUE CROSS/BLUE SHIELD | Admitting: Certified Registered Nurse Anesthetist

## 2017-12-26 ENCOUNTER — Inpatient Hospital Stay (HOSPITAL_COMMUNITY): Payer: BLUE CROSS/BLUE SHIELD

## 2017-12-26 DIAGNOSIS — I110 Hypertensive heart disease with heart failure: Secondary | ICD-10-CM | POA: Diagnosis present

## 2017-12-26 DIAGNOSIS — I82C12 Acute embolism and thrombosis of left internal jugular vein: Secondary | ICD-10-CM | POA: Diagnosis not present

## 2017-12-26 DIAGNOSIS — J9690 Respiratory failure, unspecified, unspecified whether with hypoxia or hypercapnia: Secondary | ICD-10-CM

## 2017-12-26 DIAGNOSIS — Z452 Encounter for adjustment and management of vascular access device: Secondary | ICD-10-CM

## 2017-12-26 DIAGNOSIS — I340 Nonrheumatic mitral (valve) insufficiency: Secondary | ICD-10-CM | POA: Diagnosis present

## 2017-12-26 DIAGNOSIS — K08109 Complete loss of teeth, unspecified cause, unspecified class: Secondary | ICD-10-CM | POA: Diagnosis present

## 2017-12-26 DIAGNOSIS — R490 Dysphonia: Secondary | ICD-10-CM | POA: Diagnosis not present

## 2017-12-26 DIAGNOSIS — Z833 Family history of diabetes mellitus: Secondary | ICD-10-CM

## 2017-12-26 DIAGNOSIS — Z951 Presence of aortocoronary bypass graft: Secondary | ICD-10-CM

## 2017-12-26 DIAGNOSIS — I82A12 Acute embolism and thrombosis of left axillary vein: Secondary | ICD-10-CM | POA: Diagnosis not present

## 2017-12-26 DIAGNOSIS — J939 Pneumothorax, unspecified: Secondary | ICD-10-CM

## 2017-12-26 DIAGNOSIS — S2692XA Laceration of heart, unspecified with or without hemopericardium, initial encounter: Secondary | ICD-10-CM | POA: Diagnosis not present

## 2017-12-26 DIAGNOSIS — R911 Solitary pulmonary nodule: Secondary | ICD-10-CM

## 2017-12-26 DIAGNOSIS — Z8673 Personal history of transient ischemic attack (TIA), and cerebral infarction without residual deficits: Secondary | ICD-10-CM

## 2017-12-26 DIAGNOSIS — I708 Atherosclerosis of other arteries: Secondary | ICD-10-CM | POA: Diagnosis present

## 2017-12-26 DIAGNOSIS — T82868A Thrombosis of vascular prosthetic devices, implants and grafts, initial encounter: Secondary | ICD-10-CM | POA: Diagnosis not present

## 2017-12-26 DIAGNOSIS — I2510 Atherosclerotic heart disease of native coronary artery without angina pectoris: Secondary | ICD-10-CM | POA: Diagnosis present

## 2017-12-26 DIAGNOSIS — J9620 Acute and chronic respiratory failure, unspecified whether with hypoxia or hypercapnia: Secondary | ICD-10-CM | POA: Diagnosis not present

## 2017-12-26 DIAGNOSIS — Z9911 Dependence on respirator [ventilator] status: Secondary | ICD-10-CM | POA: Diagnosis not present

## 2017-12-26 DIAGNOSIS — R110 Nausea: Secondary | ICD-10-CM | POA: Diagnosis not present

## 2017-12-26 DIAGNOSIS — G4733 Obstructive sleep apnea (adult) (pediatric): Secondary | ICD-10-CM | POA: Diagnosis present

## 2017-12-26 DIAGNOSIS — T8149XA Infection following a procedure, other surgical site, initial encounter: Secondary | ICD-10-CM | POA: Diagnosis not present

## 2017-12-26 DIAGNOSIS — G459 Transient cerebral ischemic attack, unspecified: Secondary | ICD-10-CM | POA: Diagnosis present

## 2017-12-26 DIAGNOSIS — L089 Local infection of the skin and subcutaneous tissue, unspecified: Secondary | ICD-10-CM | POA: Diagnosis not present

## 2017-12-26 DIAGNOSIS — I6521 Occlusion and stenosis of right carotid artery: Principal | ICD-10-CM | POA: Diagnosis present

## 2017-12-26 DIAGNOSIS — D62 Acute posthemorrhagic anemia: Secondary | ICD-10-CM | POA: Diagnosis not present

## 2017-12-26 DIAGNOSIS — C3411 Malignant neoplasm of upper lobe, right bronchus or lung: Secondary | ICD-10-CM | POA: Diagnosis present

## 2017-12-26 DIAGNOSIS — T4275XA Adverse effect of unspecified antiepileptic and sedative-hypnotic drugs, initial encounter: Secondary | ICD-10-CM | POA: Diagnosis not present

## 2017-12-26 DIAGNOSIS — Z9049 Acquired absence of other specified parts of digestive tract: Secondary | ICD-10-CM

## 2017-12-26 DIAGNOSIS — T8131XA Disruption of external operation (surgical) wound, not elsewhere classified, initial encounter: Secondary | ICD-10-CM | POA: Diagnosis not present

## 2017-12-26 DIAGNOSIS — L03313 Cellulitis of chest wall: Secondary | ICD-10-CM | POA: Diagnosis not present

## 2017-12-26 DIAGNOSIS — R0602 Shortness of breath: Secondary | ICD-10-CM

## 2017-12-26 DIAGNOSIS — Z9689 Presence of other specified functional implants: Secondary | ICD-10-CM

## 2017-12-26 DIAGNOSIS — I749 Embolism and thrombosis of unspecified artery: Secondary | ICD-10-CM

## 2017-12-26 DIAGNOSIS — Z79899 Other long term (current) drug therapy: Secondary | ICD-10-CM

## 2017-12-26 DIAGNOSIS — J9692 Respiratory failure, unspecified with hypercapnia: Secondary | ICD-10-CM | POA: Diagnosis not present

## 2017-12-26 DIAGNOSIS — R609 Edema, unspecified: Secondary | ICD-10-CM

## 2017-12-26 DIAGNOSIS — Z7902 Long term (current) use of antithrombotics/antiplatelets: Secondary | ICD-10-CM

## 2017-12-26 DIAGNOSIS — E876 Hypokalemia: Secondary | ICD-10-CM | POA: Diagnosis not present

## 2017-12-26 DIAGNOSIS — I82612 Acute embolism and thrombosis of superficial veins of left upper extremity: Secondary | ICD-10-CM | POA: Diagnosis not present

## 2017-12-26 DIAGNOSIS — I5032 Chronic diastolic (congestive) heart failure: Secondary | ICD-10-CM | POA: Diagnosis present

## 2017-12-26 DIAGNOSIS — Z7951 Long term (current) use of inhaled steroids: Secondary | ICD-10-CM

## 2017-12-26 DIAGNOSIS — K219 Gastro-esophageal reflux disease without esophagitis: Secondary | ICD-10-CM | POA: Diagnosis present

## 2017-12-26 DIAGNOSIS — Z978 Presence of other specified devices: Secondary | ICD-10-CM

## 2017-12-26 DIAGNOSIS — J90 Pleural effusion, not elsewhere classified: Secondary | ICD-10-CM

## 2017-12-26 DIAGNOSIS — Z8249 Family history of ischemic heart disease and other diseases of the circulatory system: Secondary | ICD-10-CM

## 2017-12-26 DIAGNOSIS — Z7982 Long term (current) use of aspirin: Secondary | ICD-10-CM

## 2017-12-26 DIAGNOSIS — Z8701 Personal history of pneumonia (recurrent): Secondary | ICD-10-CM

## 2017-12-26 DIAGNOSIS — J449 Chronic obstructive pulmonary disease, unspecified: Secondary | ICD-10-CM | POA: Diagnosis present

## 2017-12-26 DIAGNOSIS — I712 Thoracic aortic aneurysm, without rupture: Secondary | ICD-10-CM

## 2017-12-26 DIAGNOSIS — F419 Anxiety disorder, unspecified: Secondary | ICD-10-CM | POA: Diagnosis present

## 2017-12-26 DIAGNOSIS — Z87891 Personal history of nicotine dependence: Secondary | ICD-10-CM

## 2017-12-26 DIAGNOSIS — E669 Obesity, unspecified: Secondary | ICD-10-CM | POA: Diagnosis present

## 2017-12-26 DIAGNOSIS — Z6831 Body mass index (BMI) 31.0-31.9, adult: Secondary | ICD-10-CM

## 2017-12-26 HISTORY — PX: CLOSURE OF DIAPHRAGM: SHX5777

## 2017-12-26 HISTORY — PX: CAROTID-SUBCLAVIAN BYPASS GRAFT: SHX910

## 2017-12-26 HISTORY — PX: STERNOTOMY: SHX1057

## 2017-12-26 LAB — URINALYSIS, ROUTINE W REFLEX MICROSCOPIC
Bilirubin Urine: NEGATIVE
Glucose, UA: NEGATIVE mg/dL
Hgb urine dipstick: NEGATIVE
Ketones, ur: NEGATIVE mg/dL
Nitrite: NEGATIVE
Protein, ur: NEGATIVE mg/dL
Specific Gravity, Urine: 1.019 (ref 1.005–1.030)
pH: 5 (ref 5.0–8.0)

## 2017-12-26 LAB — POCT I-STAT 7, (LYTES, BLD GAS, ICA,H+H)
Acid-Base Excess: 6 mmol/L — ABNORMAL HIGH (ref 0.0–2.0)
Bicarbonate: 32.4 mmol/L — ABNORMAL HIGH (ref 20.0–28.0)
Calcium, Ion: 1.25 mmol/L (ref 1.15–1.40)
HCT: 36 % (ref 36.0–46.0)
Hemoglobin: 12.2 g/dL (ref 12.0–15.0)
O2 Saturation: 97 %
Potassium: 4 mmol/L (ref 3.5–5.1)
Sodium: 139 mmol/L (ref 135–145)
TCO2: 34 mmol/L — ABNORMAL HIGH (ref 22–32)
pCO2 arterial: 51.4 mmHg — ABNORMAL HIGH (ref 32.0–48.0)
pH, Arterial: 7.407 (ref 7.350–7.450)
pO2, Arterial: 88 mmHg (ref 83.0–108.0)

## 2017-12-26 LAB — CBC
HCT: 31.5 % — ABNORMAL LOW (ref 36.0–46.0)
Hemoglobin: 10 g/dL — ABNORMAL LOW (ref 12.0–15.0)
MCH: 29 pg (ref 26.0–34.0)
MCHC: 31.7 g/dL (ref 30.0–36.0)
MCV: 91.3 fL (ref 80.0–100.0)
Platelets: 199 10*3/uL (ref 150–400)
RBC: 3.45 MIL/uL — ABNORMAL LOW (ref 3.87–5.11)
RDW: 12.9 % (ref 11.5–15.5)
WBC: 15.6 10*3/uL — ABNORMAL HIGH (ref 4.0–10.5)
nRBC: 0 % (ref 0.0–0.2)

## 2017-12-26 LAB — POCT I-STAT 3, ART BLOOD GAS (G3+)
Acid-Base Excess: 5 mmol/L — ABNORMAL HIGH (ref 0.0–2.0)
Bicarbonate: 31.7 mmol/L — ABNORMAL HIGH (ref 20.0–28.0)
O2 Saturation: 99 %
TCO2: 33 mmol/L — ABNORMAL HIGH (ref 22–32)
pCO2 arterial: 54.4 mmHg — ABNORMAL HIGH (ref 32.0–48.0)
pH, Arterial: 7.374 (ref 7.350–7.450)
pO2, Arterial: 165 mmHg — ABNORMAL HIGH (ref 83.0–108.0)

## 2017-12-26 LAB — POCT I-STAT, CHEM 8
BUN: 14 mg/dL (ref 8–23)
BUN: 14 mg/dL (ref 8–23)
Calcium, Ion: 1.21 mmol/L (ref 1.15–1.40)
Calcium, Ion: 1.15 mmol/L (ref 1.15–1.40)
Chloride: 99 mmol/L (ref 98–111)
Chloride: 102 mmol/L (ref 98–111)
Creatinine, Ser: 0.4 mg/dL — ABNORMAL LOW (ref 0.44–1.00)
Creatinine, Ser: 0.4 mg/dL — ABNORMAL LOW (ref 0.44–1.00)
Glucose, Bld: 119 mg/dL — ABNORMAL HIGH (ref 70–99)
Glucose, Bld: 138 mg/dL — ABNORMAL HIGH (ref 70–99)
HCT: 31 % — ABNORMAL LOW (ref 36.0–46.0)
HCT: 28 % — ABNORMAL LOW (ref 36.0–46.0)
Hemoglobin: 10.5 g/dL — ABNORMAL LOW (ref 12.0–15.0)
Hemoglobin: 9.5 g/dL — ABNORMAL LOW (ref 12.0–15.0)
Potassium: 3.9 mmol/L (ref 3.5–5.1)
Potassium: 4.2 mmol/L (ref 3.5–5.1)
Sodium: 139 mmol/L (ref 135–145)
Sodium: 139 mmol/L (ref 135–145)
TCO2: 34 mmol/L — ABNORMAL HIGH (ref 22–32)
TCO2: 31 mmol/L (ref 22–32)

## 2017-12-26 LAB — GLUCOSE, CAPILLARY
Glucose-Capillary: 186 mg/dL — ABNORMAL HIGH (ref 70–99)
Glucose-Capillary: 171 mg/dL — ABNORMAL HIGH (ref 70–99)

## 2017-12-26 LAB — POCT I-STAT 4, (NA,K, GLUC, HGB,HCT)
Glucose, Bld: 156 mg/dL — ABNORMAL HIGH (ref 70–99)
HCT: 28 % — ABNORMAL LOW (ref 36.0–46.0)
Hemoglobin: 9.5 g/dL — ABNORMAL LOW (ref 12.0–15.0)
Potassium: 4.3 mmol/L (ref 3.5–5.1)
Sodium: 141 mmol/L (ref 135–145)

## 2017-12-26 LAB — COOXEMETRY PANEL
Carboxyhemoglobin: 1.4 % (ref 0.5–1.5)
Methemoglobin: 2 % — ABNORMAL HIGH (ref 0.0–1.5)
O2 Saturation: 74.4 %
Total hemoglobin: 7.7 g/dL — ABNORMAL LOW (ref 12.0–16.0)

## 2017-12-26 LAB — PREPARE RBC (CROSSMATCH)

## 2017-12-26 SURGERY — STERNOTOMY
Anesthesia: General | Laterality: Right

## 2017-12-26 MED ORDER — TRAMADOL HCL 50 MG PO TABS
50.0000 mg | ORAL_TABLET | Freq: Four times a day (QID) | ORAL | Status: DC | PRN
  Administered 2017-12-27 – 2018-01-05 (×7): 100 mg via ORAL
  Administered 2018-01-06: 50 mg via ORAL
  Administered 2018-01-06: 100 mg via ORAL
  Administered 2018-01-07: 50 mg via ORAL
  Administered 2018-01-07 – 2018-01-09 (×3): 100 mg via ORAL
  Administered 2018-01-10: 50 mg via ORAL
  Administered 2018-01-10 – 2018-01-13 (×4): 100 mg via ORAL
  Filled 2017-12-26: qty 2
  Filled 2017-12-26: qty 1
  Filled 2017-12-26 (×4): qty 2
  Filled 2017-12-26: qty 1
  Filled 2017-12-26 (×13): qty 2

## 2017-12-26 MED ORDER — MOMETASONE FURO-FORMOTEROL FUM 200-5 MCG/ACT IN AERO
2.0000 | INHALATION_SPRAY | Freq: Two times a day (BID) | RESPIRATORY_TRACT | Status: DC
  Administered 2017-12-27 – 2018-01-12 (×32): 2 via RESPIRATORY_TRACT
  Filled 2017-12-26 (×2): qty 8.8

## 2017-12-26 MED ORDER — SODIUM CHLORIDE 0.9 % IV SOLN
INTRAVENOUS | Status: AC
  Filled 2017-12-26: qty 1.2

## 2017-12-26 MED ORDER — PHENYLEPHRINE HCL-NACL 20-0.9 MG/250ML-% IV SOLN
30.0000 ug/min | INTRAVENOUS | Status: DC
  Filled 2017-12-26: qty 250

## 2017-12-26 MED ORDER — BISACODYL 5 MG PO TBEC
10.0000 mg | DELAYED_RELEASE_TABLET | Freq: Every day | ORAL | Status: DC
  Administered 2017-12-27 – 2018-01-13 (×17): 10 mg via ORAL
  Filled 2017-12-26 (×18): qty 2

## 2017-12-26 MED ORDER — HEMOSTATIC AGENTS (NO CHARGE) OPTIME
TOPICAL | Status: DC | PRN
  Administered 2017-12-26 (×3): 1 via TOPICAL

## 2017-12-26 MED ORDER — FENTANYL 2500MCG IN NS 250ML (10MCG/ML) PREMIX INFUSION
25.0000 ug/h | INTRAVENOUS | Status: DC
  Administered 2017-12-26: 50 ug/h via INTRAVENOUS
  Administered 2017-12-27: 175 ug/h via INTRAVENOUS
  Filled 2017-12-26 (×2): qty 250

## 2017-12-26 MED ORDER — PHENYLEPHRINE HCL-NACL 10-0.9 MG/250ML-% IV SOLN: 0.0000 ug/min | INTRAVENOUS | Status: DC

## 2017-12-26 MED ORDER — HEPARIN SODIUM (PORCINE) 1000 UNIT/ML IJ SOLN
INTRAMUSCULAR | Status: AC
  Filled 2017-12-26: qty 1

## 2017-12-26 MED ORDER — VASOPRESSIN 20 UNIT/ML IV SOLN
INTRAVENOUS | Status: AC
  Filled 2017-12-26: qty 1

## 2017-12-26 MED ORDER — LACTATED RINGERS IV SOLN
INTRAVENOUS | Status: DC
  Administered 2017-12-26 (×2): via INTRAVENOUS

## 2017-12-26 MED ORDER — SODIUM CHLORIDE 0.9 % IV SOLN
1.5000 g | INTRAVENOUS | Status: AC
  Administered 2017-12-26: 1.5 g via INTRAVENOUS
  Filled 2017-12-26: qty 1.5

## 2017-12-26 MED ORDER — VANCOMYCIN HCL 10 G IV SOLR
1250.0000 mg | INTRAVENOUS | Status: AC
  Administered 2017-12-26: 1250 mg via INTRAVENOUS
  Filled 2017-12-26: qty 1250

## 2017-12-26 MED ORDER — HEPARIN SODIUM (PORCINE) 1000 UNIT/ML IJ SOLN
INTRAMUSCULAR | Status: DC | PRN
  Administered 2017-12-26: 9000 [IU] via INTRAVENOUS
  Administered 2017-12-26: 3000 [IU] via INTRAVENOUS

## 2017-12-26 MED ORDER — EPINEPHRINE PF 1 MG/ML IJ SOLN
0.0000 ug/min | INTRAVENOUS | Status: DC
  Filled 2017-12-26: qty 4

## 2017-12-26 MED ORDER — FENTANYL CITRATE (PF) 100 MCG/2ML IJ SOLN: 25.0000 ug | INTRAMUSCULAR | Status: DC | PRN

## 2017-12-26 MED ORDER — SODIUM CHLORIDE 0.9 % IV SOLN
750.0000 mg | INTRAVENOUS | Status: AC
  Administered 2017-12-26: 750 mg via INTRAVENOUS
  Filled 2017-12-26: qty 750

## 2017-12-26 MED ORDER — POTASSIUM CHLORIDE 10 MEQ/50ML IV SOLN: 10.0000 meq | Freq: Every day | INTRAVENOUS | Status: DC | PRN

## 2017-12-26 MED ORDER — DEXAMETHASONE SODIUM PHOSPHATE 10 MG/ML IJ SOLN
INTRAMUSCULAR | Status: AC
  Filled 2017-12-26: qty 1

## 2017-12-26 MED ORDER — ACETAMINOPHEN 500 MG PO TABS
1000.0000 mg | ORAL_TABLET | Freq: Four times a day (QID) | ORAL | Status: AC
  Administered 2017-12-27 – 2017-12-31 (×14): 1000 mg via ORAL
  Filled 2017-12-26 (×14): qty 2

## 2017-12-26 MED ORDER — ALBUMIN HUMAN 5 % IV SOLN: 12.5000 g | Freq: Once | INTRAVENOUS | Status: DC

## 2017-12-26 MED ORDER — MILRINONE LACTATE IN DEXTROSE 20-5 MG/100ML-% IV SOLN
0.3000 ug/kg/min | INTRAVENOUS | Status: AC
  Administered 2017-12-26: 0.125 ug/kg/min via INTRAVENOUS
  Filled 2017-12-26: qty 100

## 2017-12-26 MED ORDER — ONDANSETRON HCL 4 MG/2ML IJ SOLN
INTRAMUSCULAR | Status: DC | PRN
  Administered 2017-12-26: 4 mg via INTRAVENOUS

## 2017-12-26 MED ORDER — MIDAZOLAM HCL 5 MG/5ML IJ SOLN
INTRAMUSCULAR | Status: DC | PRN
  Administered 2017-12-26 (×2): 1 mg via INTRAVENOUS

## 2017-12-26 MED ORDER — CHLORHEXIDINE GLUCONATE 0.12% ORAL RINSE (MEDLINE KIT)
15.0000 mL | Freq: Two times a day (BID) | OROMUCOSAL | Status: DC
  Administered 2017-12-26 – 2017-12-27 (×2): 15 mL via OROMUCOSAL

## 2017-12-26 MED ORDER — FENTANYL CITRATE (PF) 100 MCG/2ML IJ SOLN
INTRAMUSCULAR | Status: AC
  Filled 2017-12-26: qty 2

## 2017-12-26 MED ORDER — FENTANYL CITRATE (PF) 100 MCG/2ML IJ SOLN
50.0000 ug | Freq: Once | INTRAMUSCULAR | Status: AC
  Administered 2017-12-26: 50 ug via INTRAVENOUS
  Filled 2017-12-26: qty 2

## 2017-12-26 MED ORDER — INSULIN ASPART 100 UNIT/ML ~~LOC~~ SOLN
0.0000 [IU] | SUBCUTANEOUS | Status: DC
  Administered 2017-12-26: 4 [IU] via SUBCUTANEOUS
  Administered 2017-12-27 – 2017-12-28 (×6): 2 [IU] via SUBCUTANEOUS

## 2017-12-26 MED ORDER — 0.9 % SODIUM CHLORIDE (POUR BTL) OPTIME
TOPICAL | Status: DC | PRN
  Administered 2017-12-26: 1000 mL

## 2017-12-26 MED ORDER — MIDAZOLAM HCL 2 MG/2ML IJ SOLN
1.0000 mg | INTRAMUSCULAR | Status: DC | PRN
  Administered 2017-12-27: 1 mg via INTRAVENOUS

## 2017-12-26 MED ORDER — MIDAZOLAM HCL 2 MG/2ML IJ SOLN
INTRAMUSCULAR | Status: AC
  Filled 2017-12-26: qty 2

## 2017-12-26 MED ORDER — ACETAMINOPHEN 160 MG/5ML PO SOLN
1000.0000 mg | Freq: Four times a day (QID) | ORAL | Status: AC
  Administered 2017-12-26 – 2017-12-29 (×3): 1000 mg via ORAL
  Filled 2017-12-26 (×3): qty 40.6

## 2017-12-26 MED ORDER — PROPOFOL 10 MG/ML IV BOLUS
INTRAVENOUS | Status: AC
  Filled 2017-12-26: qty 20

## 2017-12-26 MED ORDER — ROCURONIUM BROMIDE 50 MG/5ML IV SOSY
PREFILLED_SYRINGE | INTRAVENOUS | Status: AC
  Filled 2017-12-26: qty 5

## 2017-12-26 MED ORDER — DEXMEDETOMIDINE HCL IN NACL 200 MCG/50ML IV SOLN
0.0000 ug/kg/h | INTRAVENOUS | Status: DC
  Administered 2017-12-26: 1.1 ug/kg/h via INTRAVENOUS
  Administered 2017-12-26 (×3): 1.2 ug/kg/h via INTRAVENOUS
  Administered 2017-12-26: 0.7 ug/kg/h via INTRAVENOUS
  Administered 2017-12-27: 1.2 ug/kg/h via INTRAVENOUS
  Filled 2017-12-26: qty 50
  Filled 2017-12-26: qty 100
  Filled 2017-12-26 (×2): qty 50

## 2017-12-26 MED ORDER — PLASMA-LYTE 148 IV SOLN
INTRAVENOUS | Status: DC
  Filled 2017-12-26: qty 2.5

## 2017-12-26 MED ORDER — ALBUMIN HUMAN 5 % IV SOLN
INTRAVENOUS | Status: DC | PRN
  Administered 2017-12-26: 12:00:00 via INTRAVENOUS

## 2017-12-26 MED ORDER — INSULIN REGULAR(HUMAN) IN NACL 100-0.9 UT/100ML-% IV SOLN
INTRAVENOUS | Status: DC
  Filled 2017-12-26: qty 100

## 2017-12-26 MED ORDER — TRANEXAMIC ACID (OHS) BOLUS VIA INFUSION
15.0000 mg/kg | INTRAVENOUS | Status: DC
  Filled 2017-12-26: qty 1277

## 2017-12-26 MED ORDER — CEFAZOLIN SODIUM-DEXTROSE 2-4 GM/100ML-% IV SOLN
2.0000 g | INTRAVENOUS | Status: DC
  Filled 2017-12-26: qty 100

## 2017-12-26 MED ORDER — CHLORHEXIDINE GLUCONATE 4 % EX LIQD: 60.0000 mL | Freq: Once | CUTANEOUS | Status: DC

## 2017-12-26 MED ORDER — SUFENTANIL CITRATE 50 MCG/ML IV SOLN
INTRAVENOUS | Status: AC
  Filled 2017-12-26: qty 1

## 2017-12-26 MED ORDER — SODIUM CHLORIDE 0.9 % IV SOLN
INTRAVENOUS | Status: DC | PRN
  Administered 2017-12-26: 08:00:00

## 2017-12-26 MED ORDER — SUFENTANIL CITRATE 50 MCG/ML IV SOLN
INTRAVENOUS | Status: DC | PRN
  Administered 2017-12-26 (×2): 10 ug via INTRAVENOUS
  Administered 2017-12-26: 5 ug via INTRAVENOUS
  Administered 2017-12-26: 30 ug via INTRAVENOUS
  Administered 2017-12-26: 5 ug via INTRAVENOUS
  Administered 2017-12-26: 10 ug via INTRAVENOUS

## 2017-12-26 MED ORDER — DOCUSATE SODIUM 50 MG/5ML PO LIQD
100.0000 mg | Freq: Two times a day (BID) | ORAL | Status: DC | PRN
  Administered 2018-01-10: 100 mg
  Filled 2017-12-26: qty 10

## 2017-12-26 MED ORDER — PROPOFOL 10 MG/ML IV BOLUS
INTRAVENOUS | Status: DC | PRN
  Administered 2017-12-26: 100 mg via INTRAVENOUS

## 2017-12-26 MED ORDER — METOCLOPRAMIDE HCL 5 MG/ML IJ SOLN
10.0000 mg | Freq: Four times a day (QID) | INTRAMUSCULAR | Status: AC
  Administered 2017-12-26 – 2017-12-31 (×19): 10 mg via INTRAVENOUS
  Filled 2017-12-26 (×19): qty 2

## 2017-12-26 MED ORDER — DEXAMETHASONE SODIUM PHOSPHATE 10 MG/ML IJ SOLN
INTRAMUSCULAR | Status: DC | PRN
  Administered 2017-12-26: 10 mg via INTRAVENOUS

## 2017-12-26 MED ORDER — FAMOTIDINE IN NACL 20-0.9 MG/50ML-% IV SOLN
20.0000 mg | Freq: Two times a day (BID) | INTRAVENOUS | Status: DC
  Administered 2017-12-26 – 2017-12-27 (×3): 20 mg via INTRAVENOUS
  Filled 2017-12-26 (×2): qty 50

## 2017-12-26 MED ORDER — ORAL CARE MOUTH RINSE
15.0000 mL | OROMUCOSAL | Status: DC
  Administered 2017-12-26 – 2017-12-27 (×8): 15 mL via OROMUCOSAL

## 2017-12-26 MED ORDER — PROTAMINE SULFATE 10 MG/ML IV SOLN
INTRAVENOUS | Status: DC | PRN
  Administered 2017-12-26: 110 mg via INTRAVENOUS
  Administered 2017-12-26: 10 mg via INTRAVENOUS

## 2017-12-26 MED ORDER — SENNOSIDES-DOCUSATE SODIUM 8.6-50 MG PO TABS
1.0000 | ORAL_TABLET | Freq: Every day | ORAL | Status: DC
  Administered 2017-12-27 – 2018-01-12 (×16): 1 via ORAL
  Filled 2017-12-26 (×18): qty 1

## 2017-12-26 MED ORDER — TRANEXAMIC ACID 1000 MG/10ML IV SOLN
1.5000 mg/kg/h | INTRAVENOUS | Status: DC
  Filled 2017-12-26: qty 25

## 2017-12-26 MED ORDER — ROCURONIUM BROMIDE 10 MG/ML (PF) SYRINGE
PREFILLED_SYRINGE | INTRAVENOUS | Status: DC | PRN
  Administered 2017-12-26: 10 mg via INTRAVENOUS
  Administered 2017-12-26: 20 mg via INTRAVENOUS
  Administered 2017-12-26: 50 mg via INTRAVENOUS
  Administered 2017-12-26: 20 mg via INTRAVENOUS
  Administered 2017-12-26: 10 mg via INTRAVENOUS
  Administered 2017-12-26: 20 mg via INTRAVENOUS
  Administered 2017-12-26: 10 mg via INTRAVENOUS
  Administered 2017-12-26: 20 mg via INTRAVENOUS
  Administered 2017-12-26: 10 mg via INTRAVENOUS

## 2017-12-26 MED ORDER — FENTANYL BOLUS VIA INFUSION
25.0000 ug | INTRAVENOUS | Status: DC | PRN
  Filled 2017-12-26: qty 25

## 2017-12-26 MED ORDER — SODIUM CHLORIDE 0.9 % IV SOLN
INTRAVENOUS | Status: DC
  Filled 2017-12-26: qty 30

## 2017-12-26 MED ORDER — LACTATED RINGERS IV SOLN: INTRAVENOUS | Status: DC

## 2017-12-26 MED ORDER — OXYCODONE HCL 5 MG PO TABS
5.0000 mg | ORAL_TABLET | ORAL | Status: DC | PRN
  Administered 2017-12-27 – 2017-12-29 (×9): 10 mg via ORAL
  Administered 2017-12-29 (×2): 5 mg via ORAL
  Administered 2017-12-30 – 2018-01-03 (×13): 10 mg via ORAL
  Administered 2018-01-03 (×2): 5 mg via ORAL
  Administered 2018-01-04 – 2018-01-13 (×46): 10 mg via ORAL
  Filled 2017-12-26 (×18): qty 2
  Filled 2017-12-26: qty 1
  Filled 2017-12-26 (×10): qty 2
  Filled 2017-12-26: qty 1
  Filled 2017-12-26 (×2): qty 2
  Filled 2017-12-26: qty 1
  Filled 2017-12-26 (×17): qty 2
  Filled 2017-12-26: qty 1
  Filled 2017-12-26 (×21): qty 2

## 2017-12-26 MED ORDER — VANCOMYCIN HCL IN DEXTROSE 1-5 GM/200ML-% IV SOLN
1000.0000 mg | Freq: Two times a day (BID) | INTRAVENOUS | Status: AC
  Administered 2017-12-26 – 2017-12-27 (×3): 1000 mg via INTRAVENOUS
  Filled 2017-12-26 (×4): qty 200

## 2017-12-26 MED ORDER — SODIUM CHLORIDE (PF) 0.9 % IJ SOLN
OROMUCOSAL | Status: DC | PRN
  Administered 2017-12-26 (×2): via TOPICAL

## 2017-12-26 MED ORDER — CEFAZOLIN SODIUM-DEXTROSE 2-4 GM/100ML-% IV SOLN
2.0000 g | Freq: Three times a day (TID) | INTRAVENOUS | Status: AC
  Administered 2017-12-26 – 2017-12-27 (×2): 2 g via INTRAVENOUS
  Filled 2017-12-26 (×2): qty 100

## 2017-12-26 MED ORDER — TRANEXAMIC ACID (OHS) PUMP PRIME SOLUTION
2.0000 mg/kg | INTRAVENOUS | Status: DC
  Filled 2017-12-26: qty 1.7

## 2017-12-26 MED ORDER — DEXMEDETOMIDINE HCL IN NACL 400 MCG/100ML IV SOLN
0.1000 ug/kg/h | INTRAVENOUS | Status: AC
  Administered 2017-12-26: 0.7 ug/kg/h via INTRAVENOUS
  Filled 2017-12-26: qty 100

## 2017-12-26 MED ORDER — MAGNESIUM SULFATE 50 % IJ SOLN
40.0000 meq | INTRAMUSCULAR | Status: DC
  Filled 2017-12-26: qty 9.85

## 2017-12-26 MED ORDER — MIDAZOLAM HCL 2 MG/2ML IJ SOLN
1.0000 mg | INTRAMUSCULAR | Status: DC | PRN
  Filled 2017-12-26: qty 2

## 2017-12-26 MED ORDER — SODIUM CHLORIDE 0.9 % IV SOLN
INTRAVENOUS | Status: DC | PRN
  Administered 2017-12-26: 5 ug/min via INTRAVENOUS

## 2017-12-26 MED ORDER — SODIUM CHLORIDE (PF) 0.9 % IJ SOLN
INTRAMUSCULAR | Status: AC
  Filled 2017-12-26: qty 10

## 2017-12-26 MED ORDER — MEPERIDINE HCL 50 MG/ML IJ SOLN: 6.2500 mg | INTRAMUSCULAR | Status: DC | PRN

## 2017-12-26 MED ORDER — LEVALBUTEROL HCL 0.63 MG/3ML IN NEBU
0.6300 mg | INHALATION_SOLUTION | Freq: Four times a day (QID) | RESPIRATORY_TRACT | Status: DC
  Administered 2017-12-26 – 2017-12-27 (×4): 0.63 mg via RESPIRATORY_TRACT
  Filled 2017-12-26 (×4): qty 3

## 2017-12-26 MED ORDER — PHENYLEPHRINE HCL-NACL 20-0.9 MG/250ML-% IV SOLN
0.0000 ug/min | INTRAVENOUS | Status: DC
  Administered 2017-12-27: 25 ug/min via INTRAVENOUS
  Administered 2017-12-27: 30 ug/min via INTRAVENOUS
  Filled 2017-12-26 (×2): qty 250

## 2017-12-26 MED ORDER — ATORVASTATIN CALCIUM 40 MG PO TABS
40.0000 mg | ORAL_TABLET | Freq: Every day | ORAL | Status: DC
  Administered 2017-12-26 – 2018-01-13 (×18): 40 mg via ORAL
  Filled 2017-12-26: qty 1
  Filled 2017-12-26 (×5): qty 4
  Filled 2017-12-26: qty 1
  Filled 2017-12-26 (×2): qty 4
  Filled 2017-12-26: qty 1
  Filled 2017-12-26: qty 4
  Filled 2017-12-26 (×7): qty 1

## 2017-12-26 MED ORDER — PROTAMINE SULFATE 10 MG/ML IV SOLN
INTRAVENOUS | Status: AC
  Filled 2017-12-26: qty 15

## 2017-12-26 MED ORDER — DEXTROSE-NACL 5-0.45 % IV SOLN
INTRAVENOUS | Status: DC
  Administered 2017-12-26 – 2017-12-27 (×2): via INTRAVENOUS

## 2017-12-26 MED ORDER — FUROSEMIDE 10 MG/ML IJ SOLN
INTRAMUSCULAR | Status: DC | PRN
  Administered 2017-12-26: 20 mg via INTRAMUSCULAR

## 2017-12-26 MED ORDER — ONDANSETRON HCL 4 MG/2ML IJ SOLN
4.0000 mg | Freq: Four times a day (QID) | INTRAMUSCULAR | Status: DC | PRN
  Administered 2017-12-27 – 2018-01-12 (×4): 4 mg via INTRAVENOUS
  Filled 2017-12-26 (×4): qty 2

## 2017-12-26 MED ORDER — POTASSIUM CHLORIDE 2 MEQ/ML IV SOLN
80.0000 meq | INTRAVENOUS | Status: DC
  Filled 2017-12-26: qty 40

## 2017-12-26 MED ORDER — NITROGLYCERIN IN D5W 200-5 MCG/ML-% IV SOLN
2.0000 ug/min | INTRAVENOUS | Status: DC
  Filled 2017-12-26: qty 250

## 2017-12-26 MED ORDER — SODIUM CHLORIDE 0.9% IV SOLUTION: Freq: Once | INTRAVENOUS | Status: DC

## 2017-12-26 MED ORDER — PROMETHAZINE HCL 25 MG/ML IJ SOLN: 6.2500 mg | INTRAMUSCULAR | Status: DC | PRN

## 2017-12-26 MED ORDER — ONDANSETRON HCL 4 MG/2ML IJ SOLN
INTRAMUSCULAR | Status: AC
  Filled 2017-12-26: qty 2

## 2017-12-26 MED ORDER — CALCIUM CHLORIDE 10 % IV SOLN
INTRAVENOUS | Status: DC | PRN
  Administered 2017-12-26: 100 mg via INTRAVENOUS
  Administered 2017-12-26: 150 mg via INTRAVENOUS

## 2017-12-26 MED ORDER — MILRINONE LACTATE IN DEXTROSE 20-5 MG/100ML-% IV SOLN
0.1250 ug/kg/min | INTRAVENOUS | Status: DC
  Administered 2017-12-27: 0.25 ug/kg/min via INTRAVENOUS
  Administered 2017-12-27: 0.125 ug/kg/min via INTRAVENOUS
  Filled 2017-12-26 (×2): qty 100

## 2017-12-26 MED ORDER — DOPAMINE-DEXTROSE 3.2-5 MG/ML-% IV SOLN
0.0000 ug/kg/min | INTRAVENOUS | Status: DC
  Filled 2017-12-26: qty 250

## 2017-12-26 SURGICAL SUPPLY — 118 items
BAG DECANTER FOR FLEXI CONT (MISCELLANEOUS) IMPLANT
BANDAGE HEMOSTAT MRDH 4X4 STRL (MISCELLANEOUS) ×3 IMPLANT
BINDER BREAST XLRG (GAUZE/BANDAGES/DRESSINGS) ×5 IMPLANT
BNDG HEMOSTAT MRDH 4X4 STRL (MISCELLANEOUS) ×5
BRUSH SCRUB EZ PLAIN DRY (MISCELLANEOUS) ×25 IMPLANT
CANISTER SUCT 3000ML PPV (MISCELLANEOUS) ×5 IMPLANT
CANNULA VESSEL 3MM 2 BLNT TIP (CANNULA) ×5 IMPLANT
CATH KIT ON Q 5IN SLV (PAIN MANAGEMENT) IMPLANT
CATH ROBINSON RED A/P 18FR (CATHETERS) ×5 IMPLANT
CATH ROBINSON RED A/P 22FR (CATHETERS) ×5 IMPLANT
CATH THORACIC 28FR (CATHETERS) IMPLANT
CATH THORACIC 28FR RT ANG (CATHETERS) IMPLANT
CATH THORACIC 36FR (CATHETERS) IMPLANT
CATH THORACIC 36FR RT ANG (CATHETERS) IMPLANT
CLIP VESOCCLUDE MED 24/CT (CLIP) ×5 IMPLANT
CLIP VESOCCLUDE SM WIDE 24/CT (CLIP) ×10 IMPLANT
CLOSURE WOUND 1/2 X4 (GAUZE/BANDAGES/DRESSINGS)
CONT SPEC 4OZ CLIKSEAL STRL BL (MISCELLANEOUS) ×5 IMPLANT
COVER SURGICAL LIGHT HANDLE (MISCELLANEOUS) ×5 IMPLANT
CRADLE DONUT ADULT HEAD (MISCELLANEOUS) ×5 IMPLANT
DECANTER SPIKE VIAL GLASS SM (MISCELLANEOUS) IMPLANT
DERMABOND ADVANCED (GAUZE/BANDAGES/DRESSINGS) ×2
DERMABOND ADVANCED .7 DNX12 (GAUZE/BANDAGES/DRESSINGS) ×3 IMPLANT
DRAIN HEMOVAC 1/8 X 5 (WOUND CARE) IMPLANT
DRAPE HALF SHEET 40X57 (DRAPES) ×5 IMPLANT
DRAPE INCISE IOBAN 66X45 STRL (DRAPES) ×5 IMPLANT
DRSG AQUACEL AG ADV 3.5X14 (GAUZE/BANDAGES/DRESSINGS) ×5 IMPLANT
ELECT BLADE 6.5 EXT (BLADE) ×5 IMPLANT
ELECT CAUTERY BLADE 6.4 (BLADE) ×5 IMPLANT
ELECT REM PT RETURN 9FT ADLT (ELECTROSURGICAL) ×10
ELECTRODE REM PT RTRN 9FT ADLT (ELECTROSURGICAL) ×6 IMPLANT
EVACUATOR SILICONE 100CC (DRAIN) IMPLANT
FELT TEFLON 1X6 (MISCELLANEOUS) ×10 IMPLANT
GAUZE SPONGE 4X4 12PLY STRL (GAUZE/BANDAGES/DRESSINGS) ×5 IMPLANT
GEL ULTRASOUND 20GR AQUASONIC (MISCELLANEOUS) ×5 IMPLANT
GLOVE BIO SURGEON STRL SZ7.5 (GLOVE) ×20 IMPLANT
GOWN STRL REUS W/ TWL LRG LVL3 (GOWN DISPOSABLE) ×18 IMPLANT
GOWN STRL REUS W/TWL LRG LVL3 (GOWN DISPOSABLE) ×12
GRAFT HEMASHIELD 16X8MM (Vascular Products) ×5 IMPLANT
HEMOSTAT POWDER SURGIFOAM 1G (HEMOSTASIS) IMPLANT
HEMOSTAT SPONGE AVITENE ULTRA (HEMOSTASIS) IMPLANT
INSERT FOGARTY SM (MISCELLANEOUS) ×5 IMPLANT
KIT BASIN OR (CUSTOM PROCEDURE TRAY) ×5 IMPLANT
KIT SUCTION CATH 14FR (SUCTIONS) ×5 IMPLANT
KIT TURNOVER KIT B (KITS) ×5 IMPLANT
LOOP VESSEL MINI RED (MISCELLANEOUS) IMPLANT
NEEDLE 22X1 1/2 (OR ONLY) (NEEDLE) IMPLANT
NEEDLE PERC 18GX7CM (NEEDLE) ×5 IMPLANT
NS IRRIG 1000ML POUR BTL (IV SOLUTION) ×15 IMPLANT
PACK CAROTID (CUSTOM PROCEDURE TRAY) ×5 IMPLANT
PACK CHEST (CUSTOM PROCEDURE TRAY) ×5 IMPLANT
PAD ARMBOARD 7.5X6 YLW CONV (MISCELLANEOUS) ×10 IMPLANT
PATCH CORMATRIX 4CMX7CM (Prosthesis & Implant Heart) ×5 IMPLANT
POWDER SURGICEL 3.0 GRAM (HEMOSTASIS) ×5 IMPLANT
SEALANT SURG COSEAL 4ML (VASCULAR PRODUCTS) IMPLANT
SEALANT SURG COSEAL 8ML (VASCULAR PRODUCTS) ×5 IMPLANT
SHEATH PINNACLE 5F 10CM (SHEATH) ×5 IMPLANT
SHUNT CAROTID BYPASS 10 (VASCULAR PRODUCTS) IMPLANT
SPONGE LAP 18X18 RF (DISPOSABLE) ×15 IMPLANT
SPONGE LAP 4X18 RFD (DISPOSABLE) ×10 IMPLANT
STAPLER VISISTAT 35W (STAPLE) IMPLANT
STOPCOCK 4 WAY LG BORE MALE ST (IV SETS) ×5 IMPLANT
STRIP CLOSURE SKIN 1/2X4 (GAUZE/BANDAGES/DRESSINGS) IMPLANT
SURGIFLO W/THROMBIN 8M KIT (HEMOSTASIS) ×5 IMPLANT
SUT CHROMIC 3 0 SH 27 (SUTURE) IMPLANT
SUT ETHIBOND 2 0 SH (SUTURE) ×4
SUT ETHIBOND 2 0 SH 36X2 (SUTURE) ×6 IMPLANT
SUT ETHILON 3 0 PS 1 (SUTURE) IMPLANT
SUT PROLENE 2 0 MH 48 (SUTURE) ×20 IMPLANT
SUT PROLENE 3 0 SH DA (SUTURE) IMPLANT
SUT PROLENE 4 0 RB 1 (SUTURE)
SUT PROLENE 4 0 SH DA (SUTURE) ×35 IMPLANT
SUT PROLENE 4-0 RB1 .5 CRCL 36 (SUTURE) IMPLANT
SUT PROLENE 5 0 C 1 36 (SUTURE) ×45 IMPLANT
SUT PROLENE 6 0 CC (SUTURE) ×20 IMPLANT
SUT PROLENE 7 0 BV 1 (SUTURE) IMPLANT
SUT SILK  1 MH (SUTURE) ×4
SUT SILK 1 MH (SUTURE) ×6 IMPLANT
SUT SILK 1 TIES 10X30 (SUTURE) ×5 IMPLANT
SUT SILK 2 0 SH CR/8 (SUTURE) ×10 IMPLANT
SUT SILK 2 0SH CR/8 30 (SUTURE) ×5 IMPLANT
SUT SILK 3 0SH CR/8 30 (SUTURE) IMPLANT
SUT STEEL 6MS V (SUTURE) ×10 IMPLANT
SUT STEEL SZ 6 DBL 3X14 BALL (SUTURE) ×5 IMPLANT
SUT VIC AB 1 CTX 18 (SUTURE) ×5 IMPLANT
SUT VIC AB 1 CTX 27 (SUTURE) IMPLANT
SUT VIC AB 1 CTX 36 (SUTURE) ×4
SUT VIC AB 1 CTX36XBRD ANBCTR (SUTURE) ×6 IMPLANT
SUT VIC AB 2 TP1 27 (SUTURE) IMPLANT
SUT VIC AB 2-0 CTX 27 (SUTURE) ×5 IMPLANT
SUT VIC AB 2-0 CTX 36 (SUTURE) ×10 IMPLANT
SUT VIC AB 3-0 MH 27 (SUTURE) IMPLANT
SUT VIC AB 3-0 SH 18 (SUTURE) IMPLANT
SUT VIC AB 3-0 SH 27 (SUTURE) ×4
SUT VIC AB 3-0 SH 27X BRD (SUTURE) ×6 IMPLANT
SUT VIC AB 3-0 X1 27 (SUTURE) IMPLANT
SUT VIC AB 4-0 PS2 18 (SUTURE) ×5 IMPLANT
SUT VICRYL 0 UR6 27IN ABS (SUTURE) IMPLANT
SUT VICRYL 2 TP 1 (SUTURE) IMPLANT
SUT VICRYL 4-0 PS2 18IN ABS (SUTURE) ×5 IMPLANT
SWAB COLLECTION DEVICE MRSA (MISCELLANEOUS) IMPLANT
SWAB CULTURE ESWAB REG 1ML (MISCELLANEOUS) IMPLANT
SWABSTICK BENZOIN STERILE (MISCELLANEOUS) IMPLANT
SYR CONTROL 10ML LL (SYRINGE) IMPLANT
SYSTEM SAHARA CHEST DRAIN ATS (WOUND CARE) ×5 IMPLANT
TAPE CLOTH SURG 4X10 WHT LF (GAUZE/BANDAGES/DRESSINGS) ×5 IMPLANT
TAPE UMBILICAL COTTON 1/8X30 (MISCELLANEOUS) IMPLANT
TIP APPLICATOR SPRAY EXTEND 16 (VASCULAR PRODUCTS) IMPLANT
TOWEL GREEN STERILE FF (TOWEL DISPOSABLE) ×5 IMPLANT
TOWEL OR NON WOVEN STRL DISP B (DISPOSABLE) ×5 IMPLANT
TRAP SPECIMEN MUCOUS 40CC (MISCELLANEOUS) IMPLANT
TRAY CATH LUMEN 1 20CM STRL (SET/KITS/TRAYS/PACK) ×5 IMPLANT
TRAY FOLEY MTR SLVR 16FR STAT (SET/KITS/TRAYS/PACK) ×5 IMPLANT
TUBE CONNECTING 20'X1/4 (TUBING) ×1
TUBE CONNECTING 20X1/4 (TUBING) ×4 IMPLANT
TUBING ART PRESS 48 MALE/FEM (TUBING) ×10 IMPLANT
WATER STERILE IRR 1000ML POUR (IV SOLUTION) ×10 IMPLANT
YANKAUER SUCT BULB TIP NO VENT (SUCTIONS) ×5 IMPLANT

## 2017-12-26 NOTE — Anesthesia Procedure Notes (Addendum)
Procedure Name: Intubation Date/Time: 12/26/2017 8:44 AM Performed by: Moshe Salisbury, CRNA Pre-anesthesia Checklist: Patient identified, Emergency Drugs available, Suction available and Patient being monitored Patient Re-evaluated:Patient Re-evaluated prior to induction Oxygen Delivery Method: Circle System Utilized Preoxygenation: Pre-oxygenation with 100% oxygen Induction Type: IV induction Ventilation: Mask ventilation without difficulty Laryngoscope Size: Mac and 3 Grade View: Grade II Endobronchial tube: Left, Double lumen EBT and EBT position confirmed by fiberoptic bronchoscope and 37 Fr Number of attempts: 1 Airway Equipment and Method: Stylet Placement Confirmation: ETT inserted through vocal cords under direct vision,  positive ETCO2 and breath sounds checked- equal and bilateral Secured at: 29 cm Tube secured with: Tape Dental Injury: Teeth and Oropharynx as per pre-operative assessment

## 2017-12-26 NOTE — H&P (Signed)
PCP is Glenda Chroman, MD Referring Provider is No ref. provider found  No chief complaint on file.  Patient examined, CT scan and PET scan images personally reviewed and counseled with patient and family. HPI: 62 year old female reformed smoker with heavy tobacco history presents for evaluation of recently diagnosed right upper lobe 11 mm nodule which is extremely hypermetabolic on PET scan with SUV of 10.2 -- this was an incidental finding on a CTA of her neck to study her carotid vessels.    1-year ago she had emergency CABG x3 when she presented in cardiogenic shock with left main stenosis and non-STEMI.  At that time a CT scan had been performed which did not show the nodule.  Patient does have COPD and had difficulties with hypercarbia following sternotomy and CABG with pCo2 range between 50 and 105mmHg.  Her preoperative ejection fraction was 40-45%.  She had moderate mitral regurgitation.  Because she was ian  emergency preoperative PFTs and Dopplers were not performed.    Patient subsequently recovered and was doing well until this past month when she developed 2 or 3 episodes of TIA characterized by left-sided numbness and weakness as well as left facial numbness and weakness holding her head up.  No speech or vision changes.  She was seen by Dr. Oneida Alar VVS and carotid studies CT angiogram and arch angiography demonstrated 90% stenosis at the origin of her innominate artery with a patent old right carotid endarterectomy site.  The lesion was heavily calcified and not amenable to PCI at the origin from the aortic arch. Patient will need a repeat sternotomy for grafting from the ascending aorta to the common carotid artery in order to prevent further neurologic events-stroke.  The patient was placed on Plavix in addition to her aspirin and statin by Dr. Oneida Alar and has had no more TIA events in the past 3 weeks. Past Medical History:  Diagnosis Date  . Asthma   . COPD (chronic obstructive  pulmonary disease) ([place])   . Coronary artery disease    hx CABG  . GERD (gastroesophageal reflux disease)   . Pneumonia   . Shortness of breath dyspnea   . Stroke [facility])    "mini stroke" numbness and weakness to left side of the body    Past Surgical History:  Procedure Laterality Date  . AORTIC ARCH ANGIOGRAPHY N/A 11/04/2017   Procedure: AORTIC ARCH ANGIOGRAPHY;  Surgeon: Elam Dutch, MD;  Location: [place] CV LAB;  Service: Cardiovascular;  Laterality: N/A;  . APPENDECTOMY    . CHOLECYSTECTOMY    . COLONOSCOPY N/A 10/03/2014   Procedure: COLONOSCOPY;  Surgeon: Rogene Houston, MD;  [facility];  Service: Endoscopy;  Laterality: N/A;  730  . CORONARY ARTERY BYPASS GRAFT N/A 12/14/2016   Procedure: CORONARY ARTERY BYPASS GRAFTING (CABG) x three , using left internal mammary artery and right leg greater saphenous vein harvested endoscopically;  Surgeon: Ivin Poot, MD;  Location: [place];  Service: Open Heart Surgery;  Laterality: N/A;  . ENDARTERECTOMY Right 12/06/2014   Procedure: ENDARTERECTOMY CAROTid;  Surgeon: Elam Dutch, MD;  Location: [facility] OR;  Service: Vascular;  Laterality: Right;  . ESOPHAGOGASTRODUODENOSCOPY N/A 12/03/2016   Procedure: ESOPHAGOGASTRODUODENOSCOPY (EGD);  Surgeon: Rogene Houston, MD;  [facility];  Service: Endoscopy;  Laterality: N/A;  7:30  . LEFT HEART CATH AND CORONARY ANGIOGRAPHY N/A 12/14/2016   Procedure: LEFT HEART CATH AND CORONARY ANGIOGRAPHY;  Surgeon: Burnell Blanks, MD;  Location:  [place] INVASIVE CV LAB;  Service: Cardiovascular;  Laterality: N/A;  . PERIPHERAL VASCULAR CATHETERIZATION N/A 12/05/2014   Procedure:  Carotid Arch Angiography;  Surgeon: Conrad [place], MD;  Location: [place] CV LAB;  Service: Cardiovascular;  Laterality: N/A;  . TEE WITHOUT CARDIOVERSION N/A 12/14/2016   Procedure: TRANSESOPHAGEAL ECHOCARDIOGRAM (TEE);  Surgeon: Prescott Gum, Collier Salina, MD;  Location: [place];  Service: Open Heart  Surgery;  Laterality: N/A;    Family History  Problem Relation Age of Onset  . Heart attack Mother   . Cancer Mother   . Diabetes Mother   . Heart disease Mother   . Heart attack Father   . Heart disease Father     Social History Social History   Tobacco Use  . Smoking status: Former Smoker    Packs/day: 2.00    Years: 40.00    Pack years: 80.00  . Smokeless tobacco: Never Used  Substance Use Topics  . Alcohol use: No    Alcohol/week: 0.0 standard drinks  . Drug use: No    Current Facility-Administered Medications  Medication Dose Route Frequency Provider Last Rate Last Dose  . fentaNYL (SUBLIMAZE) 100 MCG/2ML injection           . midazolam (VERSED) 2 MG/2ML injection           . ceFAZolin (ANCEF) IVPB 2g/100 mL premix  2 g Intravenous 30 min Pre-Op Prescott Gum, Collier Salina, MD      . cefUROXime (ZINACEF) 1.5 g in sodium chloride 0.9 % 100 mL IVPB  1.5 g Intravenous To OR Prescott Gum, Collier Salina, MD      . cefUROXime (ZINACEF) 750 mg in sodium chloride 0.9 % 100 mL IVPB  750 mg Intravenous To OR Prescott Gum, Collier Salina, MD      . chlorhexidine (HIBICLENS) 4 % liquid 4 application  60 mL Topical Once Elam Dutch, MD       And  . Derrill Memo ON 12/27/2017] chlorhexidine (HIBICLENS) 4 % liquid 4 application  60 mL Topical Once Elam Dutch, MD      . dexmedetomidine (PRECEDEX) 400 MCG/100ML (4 mcg/mL) infusion  0.1-0.7 mcg/kg/hr Intravenous To OR Prescott Gum, Collier Salina, MD      . DOPamine (INTROPIN) 800 mg in dextrose 5 % 250 mL (3.2 mg/mL) infusion  0-10 mcg/kg/min Intravenous To OR Prescott Gum, Collier Salina, MD      . EPINEPHrine (ADRENALIN) 4 mg in dextrose 5 % 250 mL (0.016 mg/mL) infusion  0-10 mcg/min Intravenous To OR Prescott Gum, Collier Salina, MD      . heparin 2,500 Units, papaverine 30 mg in electrolyte-148 (PLASMALYTE-148) 500 mL irrigation   Irrigation To OR Prescott Gum, Collier Salina, MD      . heparin 30,000 units/NS 1000 mL solution for CELLSAVER   Other To OR Prescott Gum, Collier Salina, MD      . insulin regular, human  (MYXREDLIN) 100 units/ 100 mL infusion   Intravenous To OR Prescott Gum, Collier Salina, MD      . lactated ringers infusion   Intravenous Continuous Effie Berkshire, MD      . magnesium sulfate (IV Push/IM) injection 40 mEq  40 mEq Other To OR Prescott Gum, Collier Salina, MD      . milrinone (PRIMACOR) 20 MG/100 ML (0.2 mg/mL) infusion  0.3 mcg/kg/min Intravenous To OR Prescott Gum, Collier Salina, MD      . nitroGLYCERIN 50 mg in dextrose 5 % 250 mL (0.2 mg/mL) infusion  2-200 mcg/min Intravenous To OR Ivin Poot, MD      .  phenylephrine (NEOSYNEPHRINE) 20-0.9 MG/250ML-% infusion  30-200 mcg/min Intravenous To OR Prescott Gum, Collier Salina, MD      . potassium chloride injection 80 mEq  80 mEq Other To OR Prescott Gum, Collier Salina, MD      . tranexamic acid (CYKLOKAPRON) 2,500 mg in sodium chloride 0.9 % 250 mL (10 mg/mL) infusion  1.5 mg/kg/hr Intravenous To OR Prescott Gum, Collier Salina, MD      . tranexamic acid (CYKLOKAPRON) bolus via infusion - over 30 minutes 1,276.5 mg  15 mg/kg Intravenous To OR Prescott Gum, Collier Salina, MD      . tranexamic acid (CYKLOKAPRON) pump prime solution 170 mg  2 mg/kg Intracatheter To OR Prescott Gum, Collier Salina, MD      . vancomycin (VANCOCIN) 1,250 mg in sodium chloride 0.9 % 250 mL IVPB  1,250 mg Intravenous To OR Prescott Gum, Collier Salina, MD        No Known Allergies  Review of Systems  She has gained weight since she stopped smoking last year She has mild shortness of breath Walking down the hallway in the office her oxygen saturation does not drop and remains at 97-98% on room air. No hemoptysis No bone pain headache difficulty swallowing or change in vision  BP 107/87   Pulse 85   Temp 98.4 F (36.9 C) (Oral)   Resp 18   Ht 5\' 4"  (1.626 m)   Wt 83.9 kg   SpO2 99%   BMI 31.76 kg/m  Physical Exam      Exam    General- alert and comfortable    Neck- no JVD, no cervical adenopathy palpable, no carotid bruit   Lungs-scattered rhonchi bilaterally.  Poor bedside mechanics of breathing   Cor- regular rate and rhythm, no  murmur , gallop.  Well-healed sternal incision.   Abdomen- soft, non-tender   Extremities - warm, non-tender, minimal edema   Neuro- oriented, appropriate, no focal weakness   Diagnostic Tests: CT scan images shared with patient showing a right upper lobe nodule 11 mm with hypermetabolic activity.  No mediastinal nodal activity or distant metastatic disease noted.   Impression: Patient is at increased risk for stroke due to severe innominate artery stenosis.  She will require redo sternotomy and placement of graft from the ascending aorta to the right carotid.  She has a probable early stage right upper lobe lung cancer however she is a poor candidate for lobectomy because of her underlying pulmonary disease and would not have the pulmonary reserve.  I feel that a SBRT therapy would provide her the same therapeutic benefit as wedge resection at much less risk and will allow her to proceed with repair of her vascular disease much sooner as her recovery would be much shorter.  I have ordered formal pulmonary function testing and repeat echocardiogram to assess her pulmonary and cardiac reserve.  I have discussed a transthoracic needle biopsy of the right upper lobe 11 mm nodule with Dr. Owens Shark, interventional radiology.  He feels that this is a difficult biopsy with difficult access.  Because of the difficulty obtaining tissue it is his recommendation to have the patient evaluated for  empiric SBRT without tissue sample.   Plan: Patient returned after PET scan and formal PFTs.     The patient is at very high risk of debilitating stroke   Which poses greater immediate risk to her than the 1 cm pulmonary nodule  Will proceed with Dr Oneida Alar with cerebrovascular repair and attempt to wedge out the RUL nodule at  surgery The patient has severe COPD and wioll be at high risk for postop pulmonary problems which she accepts. Len Childs, MD [facility] [phone]

## 2017-12-26 NOTE — Brief Op Note (Signed)
12/26/2017  2:00 PM  PATIENT:  Emily Rocha  62 y.o. female  PRE-OPERATIVE DIAGNOSIS:  Right lung nodule symptomatic carotid stenosis  POST-OPERATIVE DIAGNOSIS:  Right lung nodule symptomatic carotid stenosis  PROCEDURE:  Procedure(s): REDO STERNOTOMY (N/A) AORTIC TO RIGHT COMMON CAROTID AND RIGHT SUBCLAVIAN  ARTERY  BYPASS (Right) REPAIR OF DIAPHRAGM Right lung Nodule not Approachable from Sternotomy    SURGEON:  Surgeon(s) and Role: Panel 1:    * Ivin Poot, MD - Primary Panel 2:    * Elam Dutch, MD - Primary  PHYSICIAN ASSISTANT: Evonnie Pat , PA  ASSISTANTS: {Allyssa ]  ANESTHESIA:   general  EBL:  400cc  BLOOD ADMINISTERED:2 units CC PRBC  DRAINS: 28 Bard Mediastinal tube   LOCAL MEDICATIONS USED:  NONE  SPECIMEN:  No Specimen  DISPOSITION OF SPECIMEN:  N/A  COUNTS:  YES  TOURNIQUET:  * No tourniquets in log *  DICTATION: .Dragon Dictation  PLAN OF CARE: Admit to inpatient   PATIENT DISPOSITION:  ICU - intubated and hemodynamically stable.   Delay start of Pharmacological VTE agent (>24hrs) due to surgical blood loss or risk of bleeding: yes

## 2017-12-26 NOTE — Progress Notes (Signed)
[facility]  Subjective  - pt arousable on precedex   Objective 115/71 73 97.7 F (36.5 C) 16 100%  Intake/Output Summary (Last 24 hours) at 12/26/2017 1613 Last data filed at 12/26/2017 1423 Gross per 24 hour  Intake 5483 ml  Output 3000 ml  Net 2483 ml   Hand grip and moves left foot to command 2+ right radial pulse  Assessment/Planning: Stable post aorto right common carotid right subclavian Hopefully extubate tomorrow  [name] 12/26/2017 4:13 PM --  Laboratory Lab Results: Recent Labs    12/26/17 1045 12/26/17 1425  WBC  --  15.6*  HGB 9.5* 10.0*  HCT 28.0* 31.5*  PLT  --  199   BMET Recent Labs    12/26/17 0903 12/26/17 1045  NA 139 139  K 3.9 4.2  CL 99 102  GLUCOSE 119* 138*  BUN 14 14  CREATININE 0.40* 0.40*    COAG Lab Results  Component Value Date   INR 1.00 12/22/2017   INR 1.40 12/14/2016   INR 1.01 12/12/2016   No results found for: PTT

## 2017-12-26 NOTE — Progress Notes (Signed)
Pre Procedure note for inpatients:   Emily Rocha has been scheduled for Procedure(s): REDO STERNOTOMY (N/A) WEDGE RESECTION RUL NODULE (Right) AORTIC TO RIGHT CAROTID  ARTERY  BYPASS (Right) today. The various methods of treatment have been discussed with the patient. After consideration of the risks, benefits and treatment options the patient has consented to the planned procedure.   The patient has been seen and labs reviewed. There are no changes in the patient's condition to prevent proceeding with the planned procedure today.  Recent labs:  Lab Results  Component Value Date   WBC 6.1 12/22/2017   HGB 12.7 12/22/2017   HCT 39.8 12/22/2017   PLT 259 12/22/2017   GLUCOSE 220 (H) 12/22/2017   CHOL 205 (H) 12/13/2016   TRIG 101 12/13/2016   HDL 68 12/13/2016   LDLCALC 117 (H) 12/13/2016   ALT 21 12/22/2017   AST 19 12/22/2017   NA 140 12/22/2017   K 3.5 12/22/2017   CL 101 12/22/2017   CREATININE 0.75 12/22/2017   BUN 16 12/22/2017   CO2 29 12/22/2017   INR 1.00 12/22/2017   HGBA1C 6.2 (H) 12/22/2017    Len Childs, MD 12/26/2017 7:16 AM

## 2017-12-26 NOTE — Op Note (Signed)
Procedure: Ascending aorta to right subclavian and right common carotid artery bypass with ligation of innominate artery  Preoperative diagnosis: Symptomatic right common carotid stenosis  Postoperative diagnosis: Same  Anesthesia: General  Assistant: Ivin Poot MD  Operative findings: Severe calcification innominate artery with plaque extending into the origins of the common carotid and right subclavian arteries.  16 x 8 mm dacron graft end-to-end to the right subclavian and right common carotid  Operative details: After obtaining informed consent, the patient was taken the operating.  The patient was placed in supine position operating table.  Dr. Prescott Gum performed a redo sternotomy.  The a sending aorta right common carotid and right subclavian arteries were dissected free circumferentially Vesseloops placed around these.  The sternotomy as well as repair of the right ventricle and diaphragm are dictated as a separate operative note by Dr. Prescott Gum.  The patient was given 8000 units of intravenous heparin.  She was given an additional 2000 units of heparin during the course of the case.  ACT was maintained above 250.  Next the right common carotid was controlled with a Henley clamp.  The right subclavian was also controlled with a Henley clamp.  There was severe calcification of the right subclavian artery.  The innominate artery was then clamped just at its origin.  The artery was then divided approximately 2 cm below the bifurcation.  There was severe calcific plaque in this area.  I attempted to remove all of this and endarterectomized to have a sewing surface to the innominate artery distally.  However, the artery was in very bad shape and I did not think this would be syllable because of the severe calcification.  Therefore I went further up on the common carotid artery and divided this.  I also divided the right subclavian artery.  Both arteries were of much better quality in this  location.  However, the right subclavian artery was still severely calcified.  I was able to clamp this but there was some difficulty with this.  I finally was able to find a soft spot that allowed [place] to clamp it without backbleeding or fracturing the artery.  Innominate artery the ascending aorta was clamped with a side-biting type clamp and a longitudinal opening made in the aorta.  A 16 x 8 mm Dacron graft was brought up in the operative field and the proximal end of this beveled.  It was then sewn end of graft to side of ascending aorta using a running 3-0 Prolene suture.  One pledgeted stitch was placed at the end.  The clamps removed there was good hemostasis.  The limbs of the graft were tunneled underneath the innominate vein.  Both limbs of the graft were then cut the length.  I then sewed first limb to the right common carotid artery in the end.  This was done with a running 5-0 Prolene suture.  Just prior to completion of anastomosis it was forebled backbled and thoroughly flushed.  Anastomosis was secured clamps released there was good hemostasis.  Attention was then turned to the right subclavian artery.  This was a much more friable fragile artery.  It was also still areas of severe calcification.  I also performed a end-to-end anastomosis to this.  At the end everything was backbled and there was bleeding from the anterior and posterior wall.  This was repaired with a few 5 0 pledgeted sutures.  There was good hemostasis at this point.  Patient's heparin was reversed with  protamine.  Dr. Prescott Gum then proceeded to close the chest.  This also was dictated as a separate operative note.  The patient tired procedure well and there were no complications.  Patient was taken to the intensive care unit in stable condition.  [name], MD [facility] Office: [phone] Pager: [phone]

## 2017-12-26 NOTE — Anesthesia Procedure Notes (Signed)
Central Venous Catheter Insertion Performed by: Effie Berkshire, MD, anesthesiologist Start/End11/12/2017 7:35 AM, 12/26/2017 7:45 AM Patient location: Pre-op. Preanesthetic checklist: patient identified, IV checked, site marked, risks and benefits discussed, surgical consent, monitors and equipment checked, pre-op evaluation, timeout performed and anesthesia consent Lidocaine 1% used for infiltration and patient sedated Hand hygiene performed  and maximum sterile barriers used  Catheter size: 9 Fr MAC introducer Procedure performed using ultrasound guided technique. Ultrasound Notes:anatomy identified, needle tip was noted to be adjacent to the nerve/plexus identified, no ultrasound evidence of intravascular and/or intraneural injection and image(s) printed for medical record Attempts: 1 Following insertion, line sutured and dressing applied. Post procedure assessment: blood return through all ports, free fluid flow and no air  Patient tolerated the procedure well with no immediate complications.

## 2017-12-26 NOTE — Transfer of Care (Signed)
Immediate Anesthesia Transfer of Care Note  [name]  Procedure(s) Performed: REDO STERNOTOMY (N/A ) REPAIR OF DIAPHRAGM AORTIC TO RIGHT COMMON CAROTID AND RIGHT SUBCLAVIAN  ARTERY  BYPASS (Right )  Patient Location: ICU  Anesthesia Type:General  Level of Consciousness: unresponsive and Patient remains intubated per anesthesia plan  Airway & Oxygen Therapy: Patient remains intubated per anesthesia plan and Patient placed on Ventilator (see vital sign flow sheet for setting)  Post-op Assessment: Report given to RN and Post -op Vital signs reviewed and stable  Post vital signs: Reviewed and stable  Last Vitals:  Vitals Value Taken Time  BP 84/51 12/26/2017  2:21 PM  Temp    Pulse 78 12/26/2017  2:21 PM  Resp 14 12/26/2017  2:21 PM  SpO2 100 % 12/26/2017  2:21 PM    Last Pain:  Vitals:   12/26/17 0656  TempSrc: Oral      Patients Stated Pain Goal: 2 (21/11/73 5670)  Complications: No apparent anesthesia complications

## 2017-12-26 NOTE — Progress Notes (Signed)
Dentures removed and given to [place], son.

## 2017-12-26 NOTE — Anesthesia Procedure Notes (Signed)
Procedure Name: Intubation Date/Time: 12/26/2017 2:10 PM Performed by: Moshe Salisbury, CRNA Pre-anesthesia Checklist: Patient identified, Emergency Drugs available, Suction available and Patient being monitored Patient Re-evaluated:Patient Re-evaluated prior to induction Oxygen Delivery Method: Circle System Utilized Preoxygenation: Pre-oxygenation with 100% oxygen Laryngoscope Size: Glidescope and 4 Tube type: Oral Tube size: 8.0 (EBT exchanged with ETT over flexible stylet with Glidescope to visualize passage of ETT though vc) mm Number of attempts: 1 Airway Equipment and Method: Bougie stylet Placement Confirmation: ETT inserted through vocal cords under direct vision,  positive ETCO2 and breath sounds checked- equal and bilateral Secured at: 21 cm Tube secured with: Tape Dental Injury: Teeth and Oropharynx as per pre-operative assessment

## 2017-12-26 NOTE — Interval H&P Note (Signed)
History and Physical Interval Note:  12/26/2017 7:27 AM  Emily Rocha  has presented today for surgery, with the diagnosis of Right lung nodule  The various methods of treatment have been discussed with the patient and family. After consideration of risks, benefits and other options for treatment, the patient has consented to  Procedure(s): REDO STERNOTOMY (N/A) WEDGE RESECTION RUL NODULE (Right) AORTIC TO RIGHT CAROTID  ARTERY  BYPASS (Right) as a surgical intervention .  The patient's history has been reviewed, patient examined, no change in status, stable for surgery.  I have reviewed the patient's chart and labs.  Questions were answered to the patient's satisfaction.     [name]

## 2017-12-26 NOTE — Progress Notes (Signed)
Patient ID: Emily Rocha, female   DOB: [birth year], 62 y.o.   MRN: [id number] EVENING ROUNDS NOTE :     [address]       [place] [address]             [phone]                 Day of Surgery Procedure(s) (LRB): REDO STERNOTOMY (N/A) AORTIC TO RIGHT COMMON CAROTID AND RIGHT SUBCLAVIAN  ARTERY  BYPASS (Right) REPAIR OF DIAPHRAGM  Total Length of Stay:  LOS: 0 days  BP 115/71   Pulse 73   Temp 97.7 F (36.5 C)   Resp 16   Ht 5\' 4"  (1.626 m)   Wt 83.9 kg   SpO2 100%   BMI 31.76 kg/m   .Intake/Output      11/10 0701 - 11/11 0700 11/11 0701 - 11/12 0700   I.V. (mL/kg)  3273.3 (39)   Blood  2133   IV Piggyback  350   Total Intake(mL/kg)  5756.3 (68.6)   Urine (mL/kg/hr)  675 (0.7)   Blood  2500   Chest Tube  42   Total Output  3217   Net  +2539.3          . albumin human    .  ceFAZolin (ANCEF) IV Stopped (12/26/17 1635)  . dexmedetomidine (PRECEDEX) IV infusion 1.2 mcg/kg/hr (12/26/17 1810)  . dextrose 5 % and 0.45% NaCl 50 mL/hr at 12/26/17 1700  . famotidine (PEPCID) IV 20 mg (12/26/17 1814)  . fentaNYL infusion INTRAVENOUS 50 mcg/hr (12/26/17 1700)  . milrinone 0.25 mcg/kg/min (12/26/17 1700)  . phenylephrine (NEO-SYNEPHRINE) Adult infusion    . potassium chloride    . vancomycin       Lab Results  Component Value Date   WBC 15.6 (H) 12/26/2017   HGB 9.5 (L) 12/26/2017   HCT 28.0 (L) 12/26/2017   PLT 199 12/26/2017   GLUCOSE 156 (H) 12/26/2017   CHOL 205 (H) 12/13/2016   TRIG 101 12/13/2016   HDL 68 12/13/2016   LDLCALC 117 (H) 12/13/2016   ALT 21 12/22/2017   AST 19 12/22/2017   NA 141 12/26/2017   K 4.3 12/26/2017   CL 102 12/26/2017   CREATININE 0.40 (L) 12/26/2017   BUN 14 12/26/2017   CO2 29 12/22/2017   INR 1.00 12/22/2017   HGBA1C 6.2 (H) 12/22/2017    remains sedated  on vent, to stay ventilated overnight due to co2 retention Not bleeding   Grace Isaac MD  Beeper [phone] Office [phone] 12/26/2017  6:38 PM

## 2017-12-26 NOTE — Anesthesia Procedure Notes (Signed)
Arterial Line Insertion Start/End11/12/2017 7:40 AM, 12/26/2017 7:51 AM Performed by: Jearld Pies, CRNA, CRNA  Patient location: Pre-op. Preanesthetic checklist: patient identified, IV checked, site marked, risks and benefits discussed, surgical consent, monitors and equipment checked, pre-op evaluation, timeout performed and anesthesia consent Lidocaine 1% used for infiltration and patient sedated Left, radial was placed Catheter size: 20 G Hand hygiene performed , maximum sterile barriers used  and Seldinger technique used Allen's test indicative of satisfactory collateral circulation Attempts: 1 Procedure performed without using ultrasound guided technique. Following insertion, dressing applied and Biopatch. Post procedure assessment: normal  Patient tolerated the procedure well with no immediate complications.

## 2017-12-27 ENCOUNTER — Inpatient Hospital Stay: Payer: Self-pay

## 2017-12-27 ENCOUNTER — Encounter (HOSPITAL_COMMUNITY): Payer: Self-pay | Admitting: Cardiothoracic Surgery

## 2017-12-27 ENCOUNTER — Inpatient Hospital Stay (HOSPITAL_COMMUNITY): Payer: BLUE CROSS/BLUE SHIELD

## 2017-12-27 DIAGNOSIS — J9692 Respiratory failure, unspecified with hypercapnia: Secondary | ICD-10-CM

## 2017-12-27 LAB — BLOOD GAS, ARTERIAL
Acid-Base Excess: 2.3 mmol/L — ABNORMAL HIGH (ref 0.0–2.0)
Bicarbonate: 26.5 mmol/L (ref 20.0–28.0)
FIO2: 50
MECHVT: 500 mL
O2 Saturation: 98.8 %
PEEP: 5 cmH2O
Patient temperature: 98.6
Pressure support: 10 cmH2O
RATE: 16 resp/min
pCO2 arterial: 42.6 mmHg (ref 32.0–48.0)
pH, Arterial: 7.41 (ref 7.350–7.450)
pO2, Arterial: 135 mmHg — ABNORMAL HIGH (ref 83.0–108.0)

## 2017-12-27 LAB — POCT I-STAT 3, ART BLOOD GAS (G3+)
Acid-Base Excess: 4 mmol/L — ABNORMAL HIGH (ref 0.0–2.0)
Acid-Base Excess: 4 mmol/L — ABNORMAL HIGH (ref 0.0–2.0)
Acid-Base Excess: 4 mmol/L — ABNORMAL HIGH (ref 0.0–2.0)
Acid-Base Excess: 2 mmol/L (ref 0.0–2.0)
Bicarbonate: 29.1 mmol/L — ABNORMAL HIGH (ref 20.0–28.0)
Bicarbonate: 30.1 mmol/L — ABNORMAL HIGH (ref 20.0–28.0)
Bicarbonate: 29.6 mmol/L — ABNORMAL HIGH (ref 20.0–28.0)
Bicarbonate: 28.8 mmol/L — ABNORMAL HIGH (ref 20.0–28.0)
O2 Saturation: 97 %
O2 Saturation: 94 %
O2 Saturation: 93 %
O2 Saturation: 95 %
Patient temperature: 38
Patient temperature: 37.9
Patient temperature: 98.5
TCO2: 30 mmol/L (ref 22–32)
TCO2: 32 mmol/L (ref 22–32)
TCO2: 31 mmol/L (ref 22–32)
TCO2: 30 mmol/L (ref 22–32)
pCO2 arterial: 48 mmHg (ref 32.0–48.0)
pCO2 arterial: 54.5 mmHg — ABNORMAL HIGH (ref 32.0–48.0)
pCO2 arterial: 48.1 mmHg — ABNORMAL HIGH (ref 32.0–48.0)
pCO2 arterial: 53.5 mmHg — ABNORMAL HIGH (ref 32.0–48.0)
pH, Arterial: 7.395 (ref 7.350–7.450)
pH, Arterial: 7.355 (ref 7.350–7.450)
pH, Arterial: 7.397 (ref 7.350–7.450)
pH, Arterial: 7.339 — ABNORMAL LOW (ref 7.350–7.450)
pO2, Arterial: 97 mmHg (ref 83.0–108.0)
pO2, Arterial: 80 mmHg — ABNORMAL LOW (ref 83.0–108.0)
pO2, Arterial: 70 mmHg — ABNORMAL LOW (ref 83.0–108.0)
pO2, Arterial: 83 mmHg (ref 83.0–108.0)

## 2017-12-27 LAB — BASIC METABOLIC PANEL
Anion gap: 8 (ref 5–15)
BUN: 11 mg/dL (ref 8–23)
CO2: 24 mmol/L (ref 22–32)
Calcium: 8 mg/dL — ABNORMAL LOW (ref 8.9–10.3)
Chloride: 104 mmol/L (ref 98–111)
Creatinine, Ser: 0.57 mg/dL (ref 0.44–1.00)
GFR calc Af Amer: >60 mL/min (ref >60)
GFR calc non Af Amer: >60 mL/min (ref >60)
Glucose, Bld: 136 mg/dL — ABNORMAL HIGH (ref 70–99)
Potassium: 3.9 mmol/L (ref 3.5–5.1)
Sodium: 136 mmol/L (ref 135–145)

## 2017-12-27 LAB — PREPARE FRESH FROZEN PLASMA: Unit division: 0

## 2017-12-27 LAB — BPAM PLATELET PHERESIS
Blood Product Expiration Date: 201911122359
ISSUE DATE / TIME: 201911111057
Unit Type and Rh: 600

## 2017-12-27 LAB — POCT ACTIVATED CLOTTING TIME
Activated Clotting Time: 213 seconds
Activated Clotting Time: 125 seconds

## 2017-12-27 LAB — PREPARE PLATELET PHERESIS: UNIT DIVISION: 0

## 2017-12-27 LAB — GLUCOSE, CAPILLARY
Glucose-Capillary: 156 mg/dL — ABNORMAL HIGH (ref 70–99)
Glucose-Capillary: 118 mg/dL — ABNORMAL HIGH (ref 70–99)
Glucose-Capillary: 135 mg/dL — ABNORMAL HIGH (ref 70–99)
Glucose-Capillary: 141 mg/dL — ABNORMAL HIGH (ref 70–99)
Glucose-Capillary: 117 mg/dL — ABNORMAL HIGH (ref 70–99)
Glucose-Capillary: 123 mg/dL — ABNORMAL HIGH (ref 70–99)

## 2017-12-27 LAB — CBC
HCT: 30.4 % — ABNORMAL LOW (ref 36.0–46.0)
Hemoglobin: 10.1 g/dL — ABNORMAL LOW (ref 12.0–15.0)
MCH: 29.5 pg (ref 26.0–34.0)
MCHC: 33.2 g/dL (ref 30.0–36.0)
MCV: 88.9 fL (ref 80.0–100.0)
Platelets: 234 10*3/uL (ref 150–400)
RBC: 3.42 MIL/uL — ABNORMAL LOW (ref 3.87–5.11)
RDW: 13 % (ref 11.5–15.5)
WBC: 17.9 10*3/uL — ABNORMAL HIGH (ref 4.0–10.5)
nRBC: 0 % (ref 0.0–0.2)

## 2017-12-27 LAB — BPAM FFP
Blood Product Expiration Date: 201911122359
Blood Product Expiration Date: 201911122359
ISSUE DATE / TIME: 201911111057
ISSUE DATE / TIME: 201911111057
Unit Type and Rh: 6200
Unit Type and Rh: 6200

## 2017-12-27 LAB — POCT I-STAT 4, (NA,K, GLUC, HGB,HCT)
Glucose, Bld: 143 mg/dL — ABNORMAL HIGH (ref 70–99)
HCT: 37 % (ref 36.0–46.0)
Hemoglobin: 12.6 g/dL (ref 12.0–15.0)
Potassium: 4.2 mmol/L (ref 3.5–5.1)
Sodium: 140 mmol/L (ref 135–145)

## 2017-12-27 LAB — COOXEMETRY PANEL
Carboxyhemoglobin: 1 % (ref 0.5–1.5)
Methemoglobin: 2.3 % — ABNORMAL HIGH (ref 0.0–1.5)
O2 Saturation: 65.4 %
Total hemoglobin: 10.3 g/dL — ABNORMAL LOW (ref 12.0–16.0)

## 2017-12-27 MED ORDER — SODIUM CHLORIDE 0.9% FLUSH
10.0000 mL | INTRAVENOUS | Status: DC | PRN
  Administered 2017-12-29: 10 mL
  Administered 2017-12-29: 40 mL
  Filled 2017-12-27 (×2): qty 40

## 2017-12-27 MED ORDER — UMECLIDINIUM BROMIDE 62.5 MCG/INH IN AEPB
1.0000 | INHALATION_SPRAY | Freq: Every day | RESPIRATORY_TRACT | Status: DC
  Administered 2017-12-27 – 2018-01-12 (×16): 1 via RESPIRATORY_TRACT
  Filled 2017-12-27 (×2): qty 7

## 2017-12-27 MED ORDER — DEXMEDETOMIDINE HCL IN NACL 400 MCG/100ML IV SOLN
0.0000 ug/kg/h | INTRAVENOUS | Status: DC
  Administered 2017-12-27 (×2): 1.2 ug/kg/h via INTRAVENOUS
  Filled 2017-12-27 (×2): qty 100

## 2017-12-27 MED ORDER — LIDOCAINE 5 % EX PTCH
1.0000 | MEDICATED_PATCH | CUTANEOUS | Status: AC
  Administered 2017-12-27: 1 via TRANSDERMAL
  Filled 2017-12-27 (×3): qty 1

## 2017-12-27 MED ORDER — ORAL CARE MOUTH RINSE
15.0000 mL | Freq: Two times a day (BID) | OROMUCOSAL | Status: DC
  Administered 2017-12-27 – 2018-01-01 (×7): 15 mL via OROMUCOSAL

## 2017-12-27 MED ORDER — LEVALBUTEROL HCL 0.63 MG/3ML IN NEBU
0.6300 mg | INHALATION_SOLUTION | Freq: Four times a day (QID) | RESPIRATORY_TRACT | Status: DC
  Administered 2017-12-27 – 2018-01-02 (×16): 0.63 mg via RESPIRATORY_TRACT
  Filled 2017-12-27 (×19): qty 3

## 2017-12-27 MED ORDER — CHLORHEXIDINE GLUCONATE CLOTH 2 % EX PADS
6.0000 | MEDICATED_PAD | Freq: Every day | CUTANEOUS | Status: DC
  Administered 2017-12-27 – 2018-01-01 (×5): 6 via TOPICAL

## 2017-12-27 MED ORDER — LEVALBUTEROL HCL 1.25 MG/0.5ML IN NEBU
1.2500 mg | INHALATION_SOLUTION | Freq: Four times a day (QID) | RESPIRATORY_TRACT | Status: DC | PRN
  Administered 2017-12-27 – 2018-01-12 (×3): 1.25 mg via RESPIRATORY_TRACT
  Filled 2017-12-27 (×2): qty 0.5

## 2017-12-27 MED ORDER — TIOTROPIUM BROMIDE MONOHYDRATE 18 MCG IN CAPS
18.0000 ug | ORAL_CAPSULE | Freq: Every day | RESPIRATORY_TRACT | Status: DC
  Filled 2017-12-27: qty 5

## 2017-12-27 MED ORDER — FUROSEMIDE 10 MG/ML IJ SOLN
20.0000 mg | Freq: Four times a day (QID) | INTRAMUSCULAR | Status: DC
  Administered 2017-12-27 – 2017-12-28 (×4): 20 mg via INTRAVENOUS
  Filled 2017-12-27 (×4): qty 2

## 2017-12-27 MED ORDER — SODIUM CHLORIDE 0.9% FLUSH
10.0000 mL | Freq: Two times a day (BID) | INTRAVENOUS | Status: DC
  Administered 2017-12-27: 10 mL
  Administered 2017-12-27: 20 mL
  Administered 2017-12-28 – 2018-01-06 (×13): 10 mL
  Administered 2018-01-07: 20 mL
  Administered 2018-01-07 – 2018-01-13 (×11): 10 mL

## 2017-12-27 MED ORDER — SODIUM CHLORIDE 0.9 % IV SOLN
1.5000 g | Freq: Two times a day (BID) | INTRAVENOUS | Status: AC
  Administered 2017-12-27 (×2): 1.5 g via INTRAVENOUS
  Filled 2017-12-27 (×2): qty 1.5

## 2017-12-27 MED ORDER — LEVALBUTEROL HCL 1.25 MG/0.5ML IN NEBU
1.2500 mg | INHALATION_SOLUTION | Freq: Four times a day (QID) | RESPIRATORY_TRACT | Status: DC
  Filled 2017-12-27: qty 0.5

## 2017-12-27 MED ORDER — AMIODARONE HCL 200 MG PO TABS
200.0000 mg | ORAL_TABLET | Freq: Two times a day (BID) | ORAL | Status: DC
  Administered 2017-12-27 – 2017-12-28 (×3): 200 mg via ORAL
  Filled 2017-12-27 (×3): qty 1

## 2017-12-27 MED FILL — Magnesium Sulfate Inj 50%: INTRAMUSCULAR | Qty: 10 | Status: AC

## 2017-12-27 MED FILL — Potassium Chloride Inj 2 mEq/ML: INTRAVENOUS | Qty: 40 | Status: AC

## 2017-12-27 MED FILL — Heparin Sodium (Porcine) Inj 1000 Unit/ML: INTRAMUSCULAR | Qty: 30 | Status: AC

## 2017-12-27 NOTE — Plan of Care (Signed)
  Problem: Cardiac: Goal: Will achieve and/or maintain hemodynamic stability Outcome: Progressing   Problem: Clinical Measurements: Goal: Postoperative complications will be avoided or minimized Outcome: Progressing   Problem: Skin Integrity: Goal: Risk for impaired skin integrity will decrease Outcome: Progressing   Problem: Urinary Elimination: Goal: Ability to achieve and maintain adequate renal perfusion and functioning will improve Outcome: Progressing   Problem: Safety: Goal: Ability to remain free from injury will improve Outcome: Not Progressing  Pt has to be redirected multiple times r/t ETT. Pt reaches for tube when awake Problem: Respiratory: Goal: Respiratory status will improve Outcome: Not Progressing  Pt remains intubated post-op

## 2017-12-27 NOTE — Progress Notes (Signed)
Peripherally Inserted Central Catheter/Midline Placement  The IV Nurse has discussed with the patient and/or persons authorized to consent for the patient, the purpose of this procedure and the potential benefits and risks involved with this procedure.  The benefits include less needle sticks, lab draws from the catheter, and the patient may be discharged home with the catheter. Risks include, but not limited to, infection, bleeding, blood clot (thrombus formation), and puncture of an artery; nerve damage and irregular heartbeat and possibility to perform a PICC exchange if needed/ordered by physician.  Alternatives to this procedure were also discussed.  Bard Power PICC patient education guide, fact sheet on infection prevention and patient information card has been provided to patient /or left at bedside.    PICC/Midline Placement Documentation  PICC Double Lumen 05/39/76 PICC Left Cephalic 43 cm (Active)  Indication for Insertion or Continuance of Line Prolonged intravenous therapies 12/27/2017  1:04 PM  Dressing Change Due 01/03/18 12/27/2017  1:04 PM     Consent obtained by Leroy Libman RN  Catalina Pizza 12/27/2017, 1:05 PM

## 2017-12-27 NOTE — Progress Notes (Signed)
RT NOTE:  Pt has order for BIPAP QHS, however, pt has a very productive cough. Metaneb therapy began today and pt is benefiting from this. RT will monitor and will place BIPAP if apnea or desaturation is noticed.

## 2017-12-27 NOTE — Progress Notes (Signed)
[facility]  Subjective  - intubated but awake on vent   Objective (!) 102/55 88 (!) 100.4 F (38 C) (!) 22 98%  Intake/Output Summary (Last 24 hours) at 12/27/2017 0938 Last data filed at 12/27/2017 0900 Gross per 24 hour  Intake 7841.31 ml  Output 4532 ml  Net 3309.31 ml   Neuro following commands 5/5 motor ue le  2+ right radial pulse  Assessment/Planning: S/p aorto to right carotid right subclavian  Neuro appears intact Hopefully extubate today  [name] 12/27/2017 9:38 AM --  Laboratory Lab Results: Recent Labs    12/26/17 1425 12/26/17 1427 12/27/17 0206  WBC 15.6*  --  17.9*  HGB 10.0* 9.5* 10.1*  HCT 31.5* 28.0* 30.4*  PLT 199  --  234   BMET Recent Labs    12/26/17 1045  12/26/17 1427 12/27/17 0206  NA 139   < > 141 136  K 4.2   < > 4.3 3.9  CL 102  --   --  104  CO2  --   --   --  24  GLUCOSE 138*   < > 156* 136*  BUN 14  --   --  11  CREATININE 0.40*  --   --  0.57  CALCIUM  --   --   --  8.0*   < > = values in this interval not displayed.    COAG Lab Results  Component Value Date   INR 1.00 12/22/2017   INR 1.40 12/14/2016   INR 1.01 12/12/2016   No results found for: PTT

## 2017-12-27 NOTE — Progress Notes (Signed)
Wasted 292mls IV fentanyl infusion with Nanci Pina, RN.  Joellen Jersey, RN

## 2017-12-27 NOTE — Procedures (Signed)
Extubation Procedure Note  Patient Details:   Name: Emily Rocha DOB: Sep 06, 1955 MRN: [id number]   Airway Documentation:    Vent end date: 12/27/17 Vent end time: 0920   Evaluation  O2 sats: stable throughout Complications: No apparent complications Patient did tolerate procedure well. Bilateral Breath Sounds: Expiratory wheezes   Yes  Patient was extubated to a 4L [place] without any complications, dyspnea or stridor noted. Patient was instructed on IS x 5, highest goal achieved was 760mL. NIF: greater than -40, VC: 1.4L.    Betti Goodenow, Eddie North 12/27/2017, 9:20 AM

## 2017-12-27 NOTE — Progress Notes (Signed)
[facility] BRIEF SICU PROGRESS NOTE  1 Day Post-Op  S/P Procedure(s) (LRB): REDO STERNOTOMY (N/A) AORTIC TO RIGHT COMMON CAROTID AND RIGHT SUBCLAVIAN  ARTERY  BYPASS (Right) REPAIR OF DIAPHRAGM   Stable day Extubated uneventfully Sinus tach w/ stable BP Breathing comfortably Diuresing well  Plan: Continue current plan  Rexene Alberts, MD 12/27/2017 6:08 PM

## 2017-12-27 NOTE — Op Note (Signed)
NAME: Emily Rocha, Emily Rocha MEDICAL RECORD [id number] ACCOUNT [id number] DATE OF BIRTH:December 02, 1955 FACILITY: [facility] LOCATION: [facility] PHYSICIAN: VAN TRIGT III, MD  OPERATIVE REPORT  DATE OF PROCEDURE:  12/26/2017  OPERATION: 1.  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 2.  I assisted Dr. Oneida Alar with reconstruction of branches of the bifurcated graft to the right common carotid artery and right subclavian artery.  PREOPERATIVE DIAGNOSES:  High-grade stenosis of the innominate artery with extension of plaque into the proximal right common carotid and subclavian arteries.  POSTOPERATIVE DIAGNOSES:  High-grade stenosis of the innominate artery with extension of plaque into the proximal right common carotid and subclavian arteries.  SURGEON:  Len Childs, MD  COSURGEON:  [name], MD, will dictate his part of operation.  ANESTHESIA:  General.  CLINICAL NOTE:  The patient is a 62 year old short obese female with severe COPD who had emergency CABG x3 one year ago for acute MI, cardiogenic shock, and left main stenosis.  She did well after that operation, although had problems with  hypoventilation and hypercarbia.  The patient presented earlier this year with symptoms of TIA and was evaluated by Dr. [name].  He had previously done a right carotid endarterectomy.  CTA of the aortic arch and branches showed heavy calcified  plaque with very tight stenosis of the innominate artery.  After full evaluation, he recommended reconstruction of the branches of the innominate artery with a bifurcated graft from the ascending aorta which needed to be placed via sternotomy, redo  sternotomy from her previous CABG.  I initially evaluated the patient for redo sternotomy and placement of a bifurcated graft to the right-sided cerebral upper extremity vessels.  The  CT scan demonstrated a 10 mm nodular density in the right upper lobe.  I was suspicious for a neoplasm  since the patient has a very long smoking history.  A PET scan showed this to be hypermetabolic with SUV of approximately 10.2.  It was round without infiltrative morphology.  There were no associated mediastinal nodes.  I discussed with the patient  having this nodule, which is at high risk of cancer, treated but felt that the risks of stroke would be more devastating at this point than the risk of this small nodule potentially growing in the short term.  I discussed the procedure of redo sternotomy  and placement of an inverted bifurcation graft with the patient.  I discussed the potential for trying to examine or wedge resect the right upper lobe nodule.  However, she understood the main goal of this operation was to restore good blood flow to her  cerebral circulation and to prevent stroke.  I discussed the procedure in detail, which the patient was very familiar with after having a sternotomy a year ago for emergency CABG.  She understood the pulmonary risks of ventilator dependence, tracheostomy, pneumonia, pleural effusion, as well as  organ failure, bleeding, transfusion, infection, and death.  She demonstrated her understanding and agreed to proceed with surgery under what I felt was informed consent.  OPERATIVE FINDINGS:  The right ventricle and mediastinal structures were very adherent and edematous even though the surgery was 12 months ago.  There was some tearing in the right ventricle with retraction of the sternum, which required pledgeted  Prolene sutures to repair.  The reconstruction of the cerebrovascular vessels was difficult due to the amount of plaque.  There was some blood loss and coagulopathy during the surgery with 2 units of blood transfusions required as well as 1 FFP and 1  platelet transfusion.  The patient did not have the right upper lobe nodule exposed because I felt it  was not safe to try to extend the dissection to the right chest through a sternotomy as the nodule was fairly posterior and not close to the apex, and I  was worried that this would increase the risk of severe postoperative pulmonary problems which could preclude any benefit from the surgery.  It was my plan to have the patient recover from the surgery, then to assess for growth of this nodule, and if  the growth was demonstrated, then refer for empiric radiation therapy.  DESCRIPTION OF PROCEDURE:  The patient was brought to the operating room and placed supine on the operating table.  General anesthesia was induced.  The chest, abdomen and legs were prepped with Betadine, draped as a sterile field.  A proper time-out was  performed.  A redo sternal incision was made.  The wires were removed.  The sternum was divided with an oscillating saw with care being taken to avoid injury to the underlying vascular structures.  The sternum was carefully elevated and dissected from the  mediastinum.  A retractor was placed.  There was very little ease in developing a plane of dissection along the diaphragm.  This was unsuccessful and, in fact, a small defect in the diaphragm was opened, which was later closed with a CorMatrix patch and  running 4-0 Prolene.  The dissection was then carried superiorly to the ascending aorta, innominate vein and innominate artery and all these structures were dissected out.  The right atrium was not visible.  I dissected the saphenous vein bypass graft from the left side of  the aorta, and both were patent.  A vessel loop surrounded these patent grafts.  The aorta was dissected from the pulmonary artery, so a partial occlusion clamp could be placed on the ascending aorta.  The innominate vein was dissected off the innominate  artery and encircled with a vessel loop.  The dominant artery was dissected to the bifurcation, and a vessel loop was placed around the innominate artery and  its 2 main branches.  At this point, the right ventricle anteriorly developed a tear, which was repaired with interrupted 4-0 pledgeted Prolene sutures.  When hemostasis was achieved, the patient was heparinized.  A partial-occluding clamp was placed on the ascending aorta,  and an aortotomy was performed.  The inverted Hemashield graft, 16 x 8 x 8, was then sewn end-to-side to the ascending aorta with a partial-occluding clamp and running 4-0 Prolene.  The suture line was covered with a fine layer of medical adhesive -  CoSeal, and the partial clamp was removed.  Hemostasis was good.  The outflow limbs of the graft were then placed under the innominate vein.  Clamps were placed on the innominate vein, proximal and distal, and the vessel was divided.  Proximally, it was oversewn with 5-0 Prolene.  The 2 branches distally, the right  common carotid and the right subclavian artery, were then prepared for the end-to-end anastomosis of the bifurcated graft.  This part of the operation will be dictated by Dr. Oneida Alar in a separate note, as well as the anastomosis of the 2 limbs of the  graft to the common carotid and the right subclavian.  After these anastomoses were completed, the clamps were removed, and  there was adequate hemostasis.  The heparin was reversed with protamine.  The patient also required FFP and platelets for coagulopathy.  The mediastinum was irrigated.  The anterior  mediastinal tube was placed and brought out through a separate incision.  The defect in the diaphragm was closed with a CorMatrix patch and running 4-0 Prolene.  The sternum was then closed with interrupted steel wire.  The patient remained stable.  The sternum was closed and wires were twisted.  The pectoralis fascia was irrigated and closed with a running Vicryl.  Subcutaneous and skin layers were closed with a  running Vicryl.  The patient remained stable and was transported back to the ICU intubated, hemodynamically  stable.  LN/NUANCE  D:12/26/2017 T:12/27/2017 JOB:[id number]

## 2017-12-27 NOTE — Consult Note (Signed)
NAME:  Emily Rocha, MRN:  [id number], DOB:  1956/01/16, LOS: 1 ADMISSION DATE:  12/26/2017, CONSULTATION DATE: 12/27/2017 REFERRING MD: Perivascular thoracic surgery, CHIEF COMPLAINT: Hypercarbia  Brief History   62 year old status post aortic right innominate artery grafting 12/26/2017 extubated 12/27/2017 with hypercarbia post extubation History of present illness   62 year old female smoker up until approximately 1 year ago.  Has a history of peripheral vascular disease had a right carotid endarterectomy and coronary bypass grafting done in the last year.  She quit smoking approximately 1 year ago.  She presents to [facility] for redo sternotomy with a grafting of the Dacron graft to the aorta and to the right innominate artery for cardiovascular thoracic surgery and vascular surgery.  There was a plan for a wedge resection of the right upper lobe for nodule but this was not attempted due to the complexity of her surgery.  She tolerated procedure well was extubated 12/27/2017 but developed some hypercarbia post extubation.  There is some question of whether her sedation had not been off long enough prior to extubation.  She is placed on noninvasive mechanical ventilatory support.  At time of examination she was awake alert moving good air.  We took her off the BiPAP we will repeat gases 1 hours to begin with aggressive pulmonary toilet cough turn deep breathing incentive spirometer flutter valve.  Suspect she has undiagnosed obstructive sleep apnea after discussion with her current husband.  Will make her noninvasive mechanical ventilatory support PRN and nocturnal and monitor her status.  Currently she is awake alert no acute distress she is on low-dose Neo-Synephrine to maintain her systolic blood pressure. Past Medical History  Coronary artery disease status post CABG Carotid endarterectomy 1 year ago Peripheral vascular disease Significant Hospital Events   Extubated 12/27/2017 with mild  hypercarbia  Consults:  12/27/2017 pulmonary critical care  Procedures:  Repeat sternotomy and grafting of a order and right innominate artery 12/2017 Left Cordis 12/26/2017  Significant Diagnostic Tests:    Micro Data:    Antimicrobials:    Interim history/subjective:  Status post redo sternotomy with aortic grafting with a right innominate artery branch.  Mild hypercarbia following extubation but was sedated.  Tolerating being off BiPAP but suspect she has underlying obstructive sleep apnea.  Objective   Blood pressure 127/60, pulse (!) 103, temperature 100.2 F (37.9 C), resp. rate 14, height 5\' 4"  (1.626 m), weight 91.9 kg, SpO2 96 %.    Vent Mode: BIPAP FiO2 (%):  [40 %-50 %] 40 % Set Rate:  [4 bmp-16 bmp] 10 bmp Vt Set:  [430 mL-500 mL] 500 mL PEEP:  [5 cmH20] 5 cmH20 Pressure Support:  [10 cmH20] 10 cmH20 Plateau Pressure:  [12 cmH20-15 cmH20] 12 cmH20   Intake/Output Summary (Last 24 hours) at 12/27/2017 1154 Last data filed at 12/27/2017 1024 Gross per 24 hour  Intake 7136.47 ml  Output 5107 ml  Net 2029.47 ml   Filed Weights   12/26/17 0656 12/27/17 0500  Weight: 83.9 kg 91.9 kg    Examination: General: Short obese female in no acute distress HENT: Short neck.  Left internal jugular cordis noted Lungs: Coarse rhonchi bilaterally, good lung excursion, chest tubes x1 without air leak Cardiovascular: Heart sounds are regular regular rate rhythm currently on Neo-Synephrine drip Sternal dressing is dry and intact, Abdomen: Obese soft positive bowel sounds Extremities: Positive edema Neuro: Awake alert follows commands moves all extremities GU: [facility] Problem list     Assessment &  Plan:  Vent dependent respiratory failure status post open heart surgery 12/26/2017 Extubated 12/27/2017 Post extubation hypercarbia COPD Suspect undiagnosed obstructive sleep apnea -PRN and nocturnal BiPAP -Avoid sedation -Bronchodilators -Pulmonary  toilet with incentive spirometer flutter valve -Mobilize as able -Repeat arterial blood gases 1 hour off of BiPAP.  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  Performed on 12/26/2017 by Dr. Murrell Converse and Dr. Dahlia Byes History of carotid stenosis with carotid endarterectomy on the right History of coronary artery disease status post coronary bypass grafting 1 year ago Congestive heart failure with grade 1 diastolic dysfunction via 2D echo with reported normal ventricular function -Per surgery -Diuresis as tolerated  History of TIA -Frequent neurochecks  History of anxiety -Hold Xanax for now  History of gastroesophageal reflux disease -PPI   Best practice:  Diet: N.p.o. Pain/Anxiety/Delirium protocol (if indicated): PRN narcotics VAP protocol (if indicated): Not indicated DVT prophylaxis: PAS GI prophylaxis: PPI Glucose control:  Mobility: br Code Status:full Family Communication: Patient and husband updated at bedside 12/27/2017 Disposition: Currently in the cardiovascular intensive care unit awake alert no acute distress  Labs   CBC: Recent Labs  Lab 12/22/17 1535  12/26/17 1045 12/26/17 1139 12/26/17 1425 12/26/17 1427 12/27/17 0206  WBC 6.1  --   --   --  15.6*  --  17.9*  HGB 12.7   < > 9.5* 12.6 10.0* 9.5* 10.1*  HCT 39.8   < > 28.0* 37.0 31.5* 28.0* 30.4*  MCV 89.4  --   --   --  91.3  --  88.9  PLT 259  --   --   --  199  --  234   < > = values in this interval not displayed.    Basic Metabolic Panel: Recent Labs  Lab 12/22/17 1535  12/26/17 0903 12/26/17 1045 12/26/17 1139 12/26/17 1427 12/27/17 0206  NA 140   < > 139 139 140 141 136  K 3.5   < > 3.9 4.2 4.2 4.3 3.9  CL 101  --  99 102  --   --  104  CO2 29  --   --   --   --   --  24  GLUCOSE 220*  --  119* 138* 143* 156* 136*  BUN 16  --  14 14   --   --  11  CREATININE 0.75  --  0.40* 0.40*  --   --  0.57  CALCIUM 9.7  --   --   --   --   --  8.0*   < > = values in this interval not displayed.   GFR: Estimated Creatinine Clearance: 78.1 mL/min (by C-G formula based on SCr of 0.57 mg/dL). Recent Labs  Lab 12/22/17 1535 12/26/17 1425 12/27/17 0206  WBC 6.1 15.6* 17.9*    Liver Function Tests: Recent Labs  Lab 12/22/17 1535  AST 19  ALT 21  ALKPHOS 92  BILITOT 0.7  PROT 7.0  ALBUMIN 3.8   No results for input(s): LIPASE, AMYLASE in the last 168 hours. No results for input(s): AMMONIA in the last 168 hours.  ABG    Component Value Date/Time   PHART 7.355 12/27/2017 1033   PCO2ART 54.5 (H) 12/27/2017 1033   PO2ART 80.0 (L) 12/27/2017 1033   HCO3 30.1 (H) 12/27/2017 1033   TCO2 32 12/27/2017 1033   O2SAT  94.0 12/27/2017 1033     Coagulation Profile: Recent Labs  Lab 12/22/17 1535  INR 1.00    Cardiac Enzymes: No results for input(s): CKTOTAL, CKMB, CKMBINDEX, TROPONINI in the last 168 hours.  HbA1C: Hgb A1c MFr Bld  Date/Time Value Ref Range Status  12/22/2017 03:35 PM 6.2 (H) 4.8 - 5.6 % Final    Comment:    (NOTE) Pre diabetes:          5.7%-6.4% Diabetes:              >6.4% Glycemic control for   <7.0% adults with diabetes   12/20/2016 03:04 PM 6.0 (H) 4.8 - 5.6 % Final    Comment:    (NOTE) Pre diabetes:          5.7%-6.4% Diabetes:              >6.4% Glycemic control for   <7.0% adults with diabetes     CBG: Recent Labs  Lab 12/26/17 1607 12/26/17 1943 12/26/17 2325 12/27/17 0342 12/27/17 0755  GLUCAP 186* 171* 156* 118* 135*    Review of Systems:   10 point review of system taken, please see HPI for positives and negatives.   Past Medical History  She,  has a past medical history of Asthma, COPD (chronic obstructive pulmonary disease) ([place]), Coronary artery disease, GERD (gastroesophageal reflux disease), Pneumonia, Shortness of breath dyspnea, and Stroke ([place]).    Surgical History    Past Surgical History:  Procedure Laterality Date  . AORTIC ARCH ANGIOGRAPHY N/A 11/04/2017   Procedure: AORTIC ARCH ANGIOGRAPHY;  Surgeon: Elam Dutch, MD;  Location: [place] [facility];  Service: Cardiovascular;  Laterality: N/A;  . APPENDECTOMY    . CHOLECYSTECTOMY    . COLONOSCOPY N/A 10/03/2014   Procedure: COLONOSCOPY;  Surgeon: Rogene Houston, MD;  [facility];  Service: Endoscopy;  Laterality: N/A;  730  . CORONARY ARTERY BYPASS GRAFT N/A 12/14/2016   Procedure: CORONARY ARTERY BYPASS GRAFTING (CABG) x three , using left internal mammary artery and right leg greater saphenous vein harvested endoscopically;  Surgeon: Ivin Poot, MD;  Location: [facility];  Service: Open Heart Surgery;  Laterality: N/A;  . ENDARTERECTOMY Right 12/06/2014   Procedure: ENDARTERECTOMY CAROTid;  Surgeon: Elam Dutch, MD;  Location: [facility] OR;  Service: Vascular;  Laterality: Right;  . ESOPHAGOGASTRODUODENOSCOPY N/A 12/03/2016   Procedure: ESOPHAGOGASTRODUODENOSCOPY (EGD);  Surgeon: Rogene Houston, MD;  [facility];  Service: Endoscopy;  Laterality: N/A;  7:30  . LEFT HEART CATH AND CORONARY ANGIOGRAPHY N/A 12/14/2016   Procedure: LEFT HEART CATH AND CORONARY ANGIOGRAPHY;  Surgeon: Burnell Blanks, MD;  Location: [place] [facility];  Service: Cardiovascular;  Laterality: N/A;  . PERIPHERAL VASCULAR CATHETERIZATION N/A 12/05/2014   Procedure:  Carotid Arch Angiography;  Surgeon: Conrad , MD;  Location: [place] [facility];  Service: Cardiovascular;  Laterality: N/A;  . TEE WITHOUT CARDIOVERSION N/A 12/14/2016   Procedure: TRANSESOPHAGEAL ECHOCARDIOGRAM (TEE);  Surgeon: Prescott Gum, Collier Salina, MD;  Location: [facility];  Service: Open Heart Surgery;  Laterality: N/A;     Social History   reports that she has quit smoking. She has a 80.00 pack-year smoking history. She has never used smokeless tobacco. She reports that she does not drink alcohol or use  drugs.  Quit smoking 1 year ago Family History   Her family history includes Cancer in her mother; Diabetes in her mother; Heart attack in her father and mother; Heart  disease in her father and mother.   Allergies No Known Allergies   Home Medications  Prior to Admission medications   Medication Sig Start Date End Date Taking? Authorizing Provider  ALPRAZolam Duanne Moron) 1 MG tablet Take 1 mg by mouth 3 (three) times daily as needed for anxiety.    Yes [provider]  aspirin 81 MG chewable tablet Chew 81 mg by mouth daily.   Yes [provider]  atorvastatin (LIPITOR) 40 MG tablet Take 1 tablet (40 mg total) daily by mouth. 12/27/16  Yes Gold, Wilder Glade, PA-C  Cholecalciferol (VITAMIN D) 2000 units CAPS Take 2,000 Units by mouth daily.   Yes [provider]  clopidogrel (PLAVIX) 75 MG tablet Take 75 mg by mouth daily. Holding til after surgery   Yes [provider]  metoprolol succinate (TOPROL-XL) 50 MG 24 hr tablet Take 50 mg by mouth daily. Take with or immediately following a meal.   Yes [provider]  Multiple Vitamin (MULTIVITAMIN WITH MINERALS) TABS tablet Take 1 tablet by mouth daily.   Yes [provider]  pantoprazole (PROTONIX) 40 MG tablet Take 40 mg by mouth daily.   Yes [provider]  potassium chloride SA (K-DUR,KLOR-CON) 20 MEQ tablet Take 20 mEq by mouth 2 (two) times daily.  10/20/17  Yes [provider]  SYMBICORT 160-4.5 MCG/ACT inhaler Inhale 2 puffs into the lungs 2 (two) times daily.    Yes [provider]  torsemide (DEMADEX) 20 MG tablet Take 2 tablets (40 mg total) by mouth daily. 02/21/17  Yes Bensimhon, Shaune Pascal, MD  VENTOLIN HFA 108 (90 BASE) MCG/ACT inhaler Inhale 2 puffs into the lungs every 6 (six) hours as needed for wheezing or shortness of breath.  08/26/14  Yes [provider]     Critical care time: 45 min    Richardson Landry Aaryn Parrilla ACNP Maryanna Shape PCCM Pager [phone] till 1  pm If no answer page [phone] 12/27/2017, 11:54 AM

## 2017-12-27 NOTE — Progress Notes (Signed)
While repositioning patient it was noted that her left IJ introducer was leaking heavily. Sutures intact and line appeared correctly placed while visually inspecting it, but the liquid leaking from the site smelled like medication. Another RN verified and stated it appears like the line had infiltrated. Dr. Darcey Nora notified who stated to place a picc line and remove cordis. All IV medications switched to right wrist piv to infuse until picc line placed. Will continue to monitor.  Joellen Jersey, RN

## 2017-12-27 NOTE — Anesthesia Postprocedure Evaluation (Signed)
Anesthesia Post Note  [name]  Procedure(s) Performed: REDO STERNOTOMY (N/A ) REPAIR OF DIAPHRAGM AORTIC TO RIGHT COMMON CAROTID AND RIGHT SUBCLAVIAN  ARTERY  BYPASS (Right )     Patient location during evaluation: SICU Anesthesia Type: General Level of consciousness: sedated Pain management: pain level controlled Vital Signs Assessment: post-procedure vital signs reviewed and stable Respiratory status: patient remains intubated per anesthesia plan Cardiovascular status: stable Postop Assessment: no apparent nausea or vomiting Anesthetic complications: no    Last Vitals:  Vitals:   12/27/17 0830 12/27/17 0840  BP: (!) 97/54 (!) 106/55  Pulse: 89 89  Resp: 12 (!) 25  Temp: 37.9 C   SpO2: 100% 99%    Last Pain:  Vitals:   12/27/17 0400  TempSrc: Bladder                 Effie Berkshire

## 2017-12-27 NOTE — Progress Notes (Addendum)
[facility] DAILY ICU PROGRESS NOTE                   [address]            [place] [address]          [phone]   1 Day Post-Op Procedure(s) (LRB): REDO STERNOTOMY (N/A) AORTIC TO RIGHT COMMON CAROTID AND RIGHT SUBCLAVIAN  ARTERY  BYPASS (Right) REPAIR OF DIAPHRAGM  Total Length of Stay:  LOS: 1 day   Subjective: Patient remains intubated. She was put in mittens so she would not pull out lines or self extubate.  Objective: Vital signs in last 24 hours: Temp:  [97.7 F (36.5 C)-100 F (37.8 C)] 100 F (37.8 C) (11/12 0700) Pulse Rate:  [71-83] 80 (11/12 0700) Cardiac Rhythm: Normal sinus rhythm (11/12 0400) Resp:  [14-23] 16 (11/12 0700) BP: (84-123)/(46-77) 99/56 (11/12 0700) SpO2:  [98 %-100 %] 99 % (11/12 0700) Arterial Line BP: (70-128)/(47-90) 105/90 (11/12 0700) FiO2 (%):  [50 %] 50 % (11/12 0413) Weight:  [91.9 kg] 91.9 kg (11/12 0500)  Filed Weights   12/26/17 0656 12/27/17 0500  Weight: 83.9 kg 91.9 kg    Weight change: 7.984 kg       Intake/Output from previous day: 11/11 0701 - 11/12 0700 In: 7643.6 [I.V.:4810.7; BBCWU:8891; IV Piggyback:699.9] Out: 6945 [Urine:1505; Emesis/NG output:75; Blood:2500; Chest Tube:212]  Intake/Output this shift: No intake/output data recorded.  Current Meds: Scheduled Meds: . acetaminophen  1,000 mg Oral Q6H   Or  . acetaminophen (TYLENOL) oral liquid 160 mg/5 mL  1,000 mg Oral Q6H  . atorvastatin  40 mg Oral Daily  . bisacodyl  10 mg Oral Daily  . chlorhexidine gluconate (MEDLINE KIT)  15 mL Mouth Rinse BID  . insulin aspart  0-24 Units Subcutaneous Q4H  . levalbuterol  0.63 mg Nebulization Q6H  . mouth rinse  15 mL Mouth Rinse 10 times per day  . metoCLOPramide (REGLAN) injection  10 mg Intravenous Q6H  . mometasone-formoterol  2 puff Inhalation BID  . senna-docusate  1 tablet Oral QHS   Continuous Infusions: . albumin human    . dexmedetomidine (PRECEDEX) IV infusion 1.2 mcg/kg/hr (12/27/17  0729)  . dextrose 5 % and 0.45% NaCl 50 mL/hr at 12/27/17 0700  . famotidine (PEPCID) IV Stopped (12/26/17 1844)  . fentaNYL infusion INTRAVENOUS 225 mcg/hr (12/27/17 0700)  . milrinone 0.25 mcg/kg/min (12/27/17 0700)  . phenylephrine (NEO-SYNEPHRINE) Adult infusion 35 mcg/min (12/27/17 0700)  . potassium chloride    . vancomycin Stopped (12/26/17 2118)   PRN Meds:.docusate, fentaNYL, fentaNYL (SUBLIMAZE) injection, midazolam, midazolam, ondansetron (ZOFRAN) IV, oxyCODONE, potassium chloride, traMADol  General appearance: alert and follows commands Neurologic: intact Heart: RRR Lungs: Diminshed at bases Abdomen: Soft, non tender, sporadic bowel sounds Extremities: Mild LE edema Wound: Aquacel intact  Lab Results: CBC: Recent Labs    12/26/17 1425 12/26/17 1427 12/27/17 0206  WBC 15.6*  --  17.9*  HGB 10.0* 9.5* 10.1*  HCT 31.5* 28.0* 30.4*  PLT 199  --  234   BMET:  Recent Labs    12/26/17 1045 12/26/17 1427 12/27/17 0206  NA 139 141 136  K 4.2 4.3 3.9  CL 102  --  104  CO2  --   --  24  GLUCOSE 138* 156* 136*  BUN 14  --  11  CREATININE 0.40*  --  0.57  CALCIUM  --   --  8.0*    CMET: Lab Results  Component Value Date   WBC 17.9 (H) 12/27/2017   HGB 10.1 (L) 12/27/2017   HCT 30.4 (L) 12/27/2017   PLT 234 12/27/2017   GLUCOSE 136 (H) 12/27/2017   CHOL 205 (H) 12/13/2016   TRIG 101 12/13/2016   HDL 68 12/13/2016   LDLCALC 117 (H) 12/13/2016   ALT 21 12/22/2017   AST 19 12/22/2017   NA 136 12/27/2017   K 3.9 12/27/2017   CL 104 12/27/2017   CREATININE 0.57 12/27/2017   BUN 11 12/27/2017   CO2 24 12/27/2017   INR 1.00 12/22/2017   HGBA1C 6.2 (H) 12/22/2017      PT/INR: No results for input(s): LABPROT, INR in the last 72 hours. Radiology: Dg Chest Port 1 View  Result Date: 12/26/2017 CLINICAL DATA:  Endotracheal tube.  Respiratory difficulty. EXAM: PORTABLE CHEST 1 VIEW COMPARISON:  12/22/2017 FINDINGS: Endotracheal tube placed. Tip is 4.5 cm  from the carina. NG tube placed. The tip is beyond the gastroesophageal junction. Normal heart size. The upper mediastinum is prominent. Lungs are under aerated and grossly clear. No pneumothorax or pleural effusion. IMPRESSION: Endotracheal and NG tubes as described. Prominent upper mediastinum. This may be related to postoperative changes. Electronically Signed   By: Marybelle Killings M.D.   On: 12/26/2017 14:59     Assessment/Plan: S/P Procedure(s) (LRB): REDO STERNOTOMY (N/A) AORTIC TO RIGHT COMMON CAROTID AND RIGHT SUBCLAVIAN  ARTERY  BYPASS (Right) REPAIR OF DIAPHRAGM  1. CV-SR in the 80's. On Neo Syneprhine and Milrinone drips., as well Precedex and Fentanyl. Co ox this am 65.4 2. Pulmonary-Remains intubated Will discuss with Dr. Prescott Gum, but likely wean to extubate this am. CXR this am appears stable (no pneumothorax, atelectasis and small effusion left base). Continue Xopenex 3. ABL anemia- H and H 10.1 and 30.4 4.Volume overload 5. Supplement potassium 6. CBGs 171/156/118. On Insulin drip. Pre op HGA1C 6.2. She likely has pre diabetes  Donielle Liston Alba PA-C 12/27/2017 7:37 AM   Neuro intact Will vent wean Needs diuresis and serial ABG to monitor pCO2 Min chest tube drainage- hold anticoagulation  patient examined and medical record reviewed,agree with above note. Tharon Aquas Trigt III 12/27/2017

## 2017-12-28 ENCOUNTER — Inpatient Hospital Stay (HOSPITAL_COMMUNITY): Payer: BLUE CROSS/BLUE SHIELD

## 2017-12-28 LAB — COMPREHENSIVE METABOLIC PANEL
ALT: 28 U/L (ref 0–44)
AST: 38 U/L (ref 15–41)
Albumin: 2.9 g/dL — ABNORMAL LOW (ref 3.5–5.0)
Alkaline Phosphatase: 68 U/L (ref 38–126)
Anion gap: 9 (ref 5–15)
BUN: 12 mg/dL (ref 8–23)
CO2: 26 mmol/L (ref 22–32)
Calcium: 8.2 mg/dL — ABNORMAL LOW (ref 8.9–10.3)
Chloride: 97 mmol/L — ABNORMAL LOW (ref 98–111)
Creatinine, Ser: 0.79 mg/dL (ref 0.44–1.00)
GFR calc Af Amer: >60 mL/min (ref >60)
GFR calc non Af Amer: >60 mL/min (ref >60)
Glucose, Bld: 178 mg/dL — ABNORMAL HIGH (ref 70–99)
Potassium: 3.3 mmol/L — ABNORMAL LOW (ref 3.5–5.1)
Sodium: 132 mmol/L — ABNORMAL LOW (ref 135–145)
Total Bilirubin: 1.6 mg/dL — ABNORMAL HIGH (ref 0.3–1.2)
Total Protein: 5.3 g/dL — ABNORMAL LOW (ref 6.5–8.1)

## 2017-12-28 LAB — POCT I-STAT 3, ART BLOOD GAS (G3+)
Acid-Base Excess: 4 mmol/L — ABNORMAL HIGH (ref 0.0–2.0)
Bicarbonate: 29.6 mmol/L — ABNORMAL HIGH (ref 20.0–28.0)
O2 Saturation: 92 %
Patient temperature: 99
TCO2: 31 mmol/L (ref 22–32)
pCO2 arterial: 52.3 mmHg — ABNORMAL HIGH (ref 32.0–48.0)
pH, Arterial: 7.362 (ref 7.350–7.450)
pO2, Arterial: 69 mmHg — ABNORMAL LOW (ref 83.0–108.0)

## 2017-12-28 LAB — CBC
HCT: 28 % — ABNORMAL LOW (ref 36.0–46.0)
Hemoglobin: 8.9 g/dL — ABNORMAL LOW (ref 12.0–15.0)
MCH: 29.6 pg (ref 26.0–34.0)
MCHC: 31.8 g/dL (ref 30.0–36.0)
MCV: 93 fL (ref 80.0–100.0)
Platelets: 170 10*3/uL (ref 150–400)
RBC: 3.01 MIL/uL — ABNORMAL LOW (ref 3.87–5.11)
RDW: 13.4 % (ref 11.5–15.5)
WBC: 18.2 10*3/uL — ABNORMAL HIGH (ref 4.0–10.5)
nRBC: 0 % (ref 0.0–0.2)

## 2017-12-28 LAB — GLUCOSE, CAPILLARY
Glucose-Capillary: 103 mg/dL — ABNORMAL HIGH (ref 70–99)
Glucose-Capillary: 113 mg/dL — ABNORMAL HIGH (ref 70–99)
Glucose-Capillary: 116 mg/dL — ABNORMAL HIGH (ref 70–99)
Glucose-Capillary: 116 mg/dL — ABNORMAL HIGH (ref 70–99)
Glucose-Capillary: 122 mg/dL — ABNORMAL HIGH (ref 70–99)
Glucose-Capillary: 130 mg/dL — ABNORMAL HIGH (ref 70–99)
Glucose-Capillary: 93 mg/dL (ref 70–99)

## 2017-12-28 LAB — COOXEMETRY PANEL
Carboxyhemoglobin: 1.7 % — ABNORMAL HIGH (ref 0.5–1.5)
Methemoglobin: 1.5 % (ref 0.0–1.5)
O2 Saturation: 74.3 %
Total hemoglobin: 9.2 g/dL — ABNORMAL LOW (ref 12.0–16.0)

## 2017-12-28 MED ORDER — POTASSIUM CHLORIDE CRYS ER 20 MEQ PO TBCR
40.0000 meq | EXTENDED_RELEASE_TABLET | Freq: Once | ORAL | Status: AC
  Administered 2017-12-28: 40 meq via ORAL
  Filled 2017-12-28: qty 2

## 2017-12-28 MED ORDER — GUAIFENESIN ER 600 MG PO TB12
600.0000 mg | ORAL_TABLET | Freq: Two times a day (BID) | ORAL | Status: DC
  Administered 2017-12-28 – 2018-01-05 (×18): 600 mg via ORAL
  Filled 2017-12-28 (×18): qty 1

## 2017-12-28 MED ORDER — POTASSIUM CHLORIDE CRYS ER 20 MEQ PO TBCR: 20.0000 meq | EXTENDED_RELEASE_TABLET | ORAL | Status: DC | PRN

## 2017-12-28 MED ORDER — FUROSEMIDE 10 MG/ML IJ SOLN
20.0000 mg | Freq: Two times a day (BID) | INTRAMUSCULAR | Status: DC
  Administered 2017-12-28 – 2018-01-01 (×10): 20 mg via INTRAVENOUS
  Filled 2017-12-28 (×11): qty 2

## 2017-12-28 MED ORDER — RESOURCE THICKENUP CLEAR PO POWD
ORAL | Status: DC | PRN
  Filled 2017-12-28 (×3): qty 125

## 2017-12-28 MED ORDER — SODIUM CHLORIDE 0.9 % IV SOLN
750.0000 mg | Freq: Two times a day (BID) | INTRAVENOUS | Status: AC
  Administered 2017-12-28 (×2): 750 mg via INTRAVENOUS
  Filled 2017-12-28 (×2): qty 750

## 2017-12-28 MED ORDER — POTASSIUM CHLORIDE CRYS ER 20 MEQ PO TBCR
20.0000 meq | EXTENDED_RELEASE_TABLET | Freq: Two times a day (BID) | ORAL | Status: DC
  Administered 2017-12-28 – 2018-01-06 (×19): 20 meq via ORAL
  Filled 2017-12-28 (×21): qty 1

## 2017-12-28 MED ORDER — FAMOTIDINE 20 MG PO TABS
20.0000 mg | ORAL_TABLET | Freq: Two times a day (BID) | ORAL | Status: DC
  Administered 2017-12-28 – 2018-01-13 (×32): 20 mg via ORAL
  Filled 2017-12-28 (×33): qty 1

## 2017-12-28 MED ORDER — METOLAZONE 5 MG PO TABS
5.0000 mg | ORAL_TABLET | Freq: Every day | ORAL | Status: AC
  Administered 2017-12-28 – 2017-12-31 (×4): 5 mg via ORAL
  Filled 2017-12-28 (×4): qty 1

## 2017-12-28 NOTE — Progress Notes (Signed)
Patient transported to x-ray for swallow test. RN went with patient.

## 2017-12-28 NOTE — Progress Notes (Signed)
      [address]       [place] [address]             [phone]      POD # 2 Aorta to right carotid and subclavian bypass BP 105/69   Pulse (!) 106   Temp 98.7 F (37.1 C) (Oral)   Resp 12   Ht 5\' 4"  (1.626 m)   Wt 89.7 kg   SpO2 94%   BMI 33.94 kg/m   Intake/Output Summary (Last 24 hours) at 12/28/2017 1729 Last data filed at 12/28/2017 1722 Gross per 24 hour  Intake 347.77 ml  Output 1705 ml  Net -1357.23 ml   DIII diet per speech  Remo Lipps C. [name], MD [facility] [phone]

## 2017-12-28 NOTE — Discharge Summary (Addendum)
Physician Discharge Summary       [address] [place]       [place] [address]             [phone]    Patient ID: Emily Rocha MRN: [id number] DOB/AGE: [birth year] 62 y.o.  Admit date: 12/26/2017 Discharge date: 01/13/2018  Admission Diagnoses: 1. Symptomatic right common carotid stenosis Discharge Diagnoses:  1. 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 2. DVT left IJ, subclavian, and axillary veins 3. Sternal wound infection 4. History of COPD (chronic obstructive pulmonary disease) ([place]) 5. History of Stroke ([place]) 6. History of GERD (gastroesophageal reflux disease) 7. History of Coronary artery disease 8. History of tobacco abuse  Consults: pulmonary/intensive care  Procedure (s):  1.  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 2.  Dr. Prescott Rocha assisted Dr. Oneida Rocha with reconstruction of branches of the bifurcated graft to the right common carotid artery and right subclavian artery by Dr. Prescott Rocha on 12/26/2017. 3.  Superficial sternal wound debridement, irrigation, and application of Prevena wound VAC. 4.  Application of ACell powder and ACell membrane by Dr. Prescott Rocha on 01/06/2018.  History of Presenting Illness: 62 year old female reformed smoker with heavy tobacco history presents for evaluation of recently diagnosed right upper lobe 11 mm nodule which is extremely hypermetabolic on PET scan with SUV of 10.2 -- this was an incidental finding on a CTA of her neck to study her carotid vessels.    1-year ago she had emergency CABG x3 when she presented in cardiogenic shock with left main stenosis and non-STEMI.  At that time a CT scan had been performed which did not show the nodule.  Patient does have  COPD and had difficulties with hypercarbia following sternotomy and CABG with pCo2 range between 50 and 11mmHg.  Her preoperative ejection fraction was 40-45%.  She had moderate mitral regurgitation.  Because she was ian  emergency preoperative PFTs and Dopplers were not performed.    Patient subsequently recovered and was doing well until this past month when she developed 2 or 3 episodes of TIA characterized by left-sided numbness and weakness as well as left facial numbness and weakness holding her head up.  No speech or vision changes.  She was seen by Dr. Oneida Rocha VVS and carotid studies CT angiogram and arch angiography demonstrated 90% stenosis at the origin of her innominate artery with a patent old right carotid endarterectomy site.  The lesion was heavily calcified and not amenable to PCI at the origin from the aortic arch. Patient will need a repeat sternotomy for grafting from the ascending aorta to the common carotid artery in order to prevent further neurologic events-stroke.  The patient was placed on Plavix in addition to her aspirin and statin by Dr. Oneida Rocha and has had no more TIA events in the past 3 weeks. Patient is at increased risk for stroke due to severe innominate artery stenosis.  She will require redo sternotomy and placement of graft from the ascending aorta to the right carotid.  She has a probable early stage right upper lobe lung cancer however she is a poor candidate for lobectomy because of her underlying pulmonary disease and would not have the pulmonary reserve.  Dr. Prescott Rocha felt that a SBRT therapy would  provide her the same therapeutic benefit as wedge resection at much less risk and will allow her to proceed with repair of her vascular disease much sooner as her recovery would be much shorter. Dr. Prescott Rocha discussed potential risks, benefits, and complications of the surgery. [name]. [name] and ascending aorta  to right subclavian and right common carotid artery bypass with ligation of innominate artery on 12/26/2017.  Brief Hospital Course:  The patient was extubated the morning of post op day one without difficulty. She remained afebrile and hemodynamically stable. She was weaned off Neo Synephrine and Milrinone drips. She had post op hypercarbia and was requiring increased oxygen requirements. CCM/pulmonary was consulted. Diuresis, aggressive pulmonary toilet, bronchodilators. It was suggested patient probably has underlying OSA so Bipap was only to be used as needed. She will require a formal sleep study after discharge.  Meta neb therapy was ordered as well. She was weaned to [facility]. She did have persistent hoarseness. In order to avoid possible aspiration, speech path consult was obtained on 11/13. Dysphagia 3 (mech soft) and honey thick liquids was recommended.   Emily Rocha, a line, chest tubes, and foley were removed early in the post operative course. She did have hoarseness post op. Etiology either from ET tube or recurrent laryngeal nerve.  She was volume over loaded and diuresed. She had ABL anemia. She did not require a post op transfusion. Last H and H was 7 and 24. She received a unit of PRBCs on 01/13/2018. She was weaned off the insulin drip.The patient's HGA1C pre op was  6.2. She had increased drainage from her chest tube wound. The wound was opened and packed daily. Dressing changes were done daily and PRN. Chest tube wound is clean and granulating with little sero sanguinous drainage. She had increased swelling of her left upper extremity. Venous duplex US done 11/17 showed findings consistent with acute deep vein thrombosis involving the left internal jugular veins, left subclavian veins and left axillary vein. There were also findings consistent with acute superficial vein thrombosis involving the left cephalic vein.She was started on a Heparin drip and Coumadin. INR was monitored daily.The patient was  felt surgically stable for transfer from the ICU to [facility] for further convalescence on 01/03/2018.  Heparin drip has been stopped .As of 11/29, her INR is up to 2.28.She will be discharged on Coumadin 2 mg every evening.   Her sternal wound developed erythema on 11/20. She was put on Vancomycin. Tere has been no growth to date. Per Dr. Prescott Rocha, patient will be discharged on oral Doxycycline 100 mg po bid. She had fluctuance under mid sternal wound on 11/21. Dr. Prescott Rocha then took her to the OR on 01/06/2018 for a superficial sternal wound debridement, irrigation, Prevena wound VAC, and Acell. Ceftriaxone was then added and this was stopped after 5 days. Prevena wound vac was changed on Friday 11/29.  She continues to progress with cardiac rehab. She was ambulating on room air. She has been tolerating a diet and has had a bowel movement.  Chest tube sutures will be removed the day of discharge. The patient is felt surgically stable for discharge today with HH.   Latest Vital Signs: Blood pressure (!) 109/51, pulse 88, temperature (!) 97.3 F (36.3 C), resp. rate 15, height 5\' 4"  (1.626 m), weight 83.7 kg, SpO2 99 %.  Physical Exam: Cardiovascular: RRR Pulmonary: Clear bilaterally Abdomen: Soft, non tender, bowel sounds present.  Wounds: Sternal wound has Praveena wound VAC.  Chest tube wound is clean and dry.  Discharge Condition: Stable and discharged to home.  Recent laboratory studies:  Lab Results  Component Value Date   WBC 7.5 01/13/2018   HGB 7.0 (L) 01/13/2018   HCT 24.4 (L) 01/13/2018   MCV 93.1 01/13/2018   PLT 373 01/13/2018   Lab Results  Component Value Date   NA 134 (L) 01/11/2018   K 3.6 01/11/2018   CL 92 (L) 01/11/2018   CO2 32 01/11/2018   CREATININE 0.57 01/11/2018   GLUCOSE 93 01/11/2018      Diagnostic Studies: Dg Chest 2 View  Result Date: 01/03/2018 CLINICAL DATA:  Shortness of breath. History of asthma and COPD. The patient has undergone previous aortic  to right common carotid and right subclavian artery bypass grafting as well as diaphragmatic repair. Also redo sternotomy 8 days ago. EXAM: CHEST - 2 VIEW COMPARISON:  Portable chest x-ray of January 01, 2018. FINDINGS: The lungs are well-expanded. There is minimal linear density at the left lung base. There is patchy density at the right lung base. There is no significant pleural effusion and no pneumothorax. The retrosternal soft tissues are unremarkable. The sternal wires are intact. The heart is normal in size. The pulmonary vascularity is not engorged. There is calcification in the wall of the aortic arch. The bony thorax exhibits no acute abnormality. IMPRESSION: Bibasilar atelectasis greatest on the right. No pleural effusion or CHF. No retrosternal abnormality is observed. Electronically Signed   By: David  Martinique M.D.   On: 01/03/2018 09:13   Dg Chest 2 View  Result Date: 12/23/2017 CLINICAL DATA:  62 year old female preoperative study for cardiac surgery. EXAM: CHEST - 2 VIEW COMPARISON:  01/19/2017 and earlier. FINDINGS: Stable and normal mediastinal contours status post CABG. Stable lung volumes and ventilation with no pneumothorax, pleural effusion, pulmonary edema, or confluent pulmonary opacity. Visualized tracheal air column is within normal limits. Abdominal Calcified aortic atherosclerosis. Stable cholecystectomy clips. Chronic small gastric hiatal hernia. Negative visible bowel gas pattern. IMPRESSION: 1. No acute cardiopulmonary abnormality. 2. Small chronic hiatal hernia. 3.  Aortic Atherosclerosis (ICD10-I70.0). Electronically Signed   By: Genevie Ann M.D.   On: 12/23/2017 08:45   Dg Chest Port 1 View  Result Date: 01/06/2018 CLINICAL DATA:  Status post left IJ catheter insertion and incision and drainage of a sternal wound. EXAM: PORTABLE CHEST 1 VIEW COMPARISON:  01/03/2018. FINDINGS: Interval left jugular catheter with its tip in the left innominate vein. No pneumothorax. Interval  widening of the superior mediastinum and enlargement of the cardiac silhouette. The aorta is tortuous and partially calcified. The lungs remain mildly hyperexpanded with mildly prominent interstitial markings. There are minimal bilateral pleural effusions. Post CABG changes are noted. Unremarkable bones. Surgical clips at the thoracic inlet on the right. IMPRESSION: 1. Left jugular catheter tip in the left innominate vein without pneumothorax. 2. Interval widening of the superior mediastinum and enlargement of the cardiac silhouette. The superior mediastinal widening is compatible with vascular engorgement. 3. Minimal bilateral pleural effusions. 4. Stable changes of COPD. Electronically Signed   By: Claudie Revering M.D.   On: 01/06/2018 10:00   Dg Chest Port 1 View  Result Date: 01/01/2018 CLINICAL DATA:  Chest tube in place EXAM: PORTABLE CHEST 1 VIEW COMPARISON:  12/31/2017 FINDINGS: No chest tube is visualized.  No pneumothorax is seen. Lungs are clear.  No pleural effusion. The heart is normal in size. Postsurgical changes related to prior  CABG. Left arm PICC terminates at the cavoatrial junction. Surgical clips overlying the right mediastinum. Median sternotomy. IMPRESSION: No chest tube is visualized.  No pneumothorax is seen. Postsurgical changes related to prior CABG. Electronically Signed   By: Julian Hy M.D.   On: 01/01/2018 08:59   Dg Chest Port 1 View  Result Date: 12/31/2017 CLINICAL DATA:  Pleural effusion. EXAM: PORTABLE CHEST 1 VIEW COMPARISON:  12/30/2017 FINDINGS: Sequelae of CABG are again identified. Left PICC terminates over the lower SVC. The cardiomediastinal silhouette is unchanged. Mild bibasilar opacities are unchanged. There is likely a persistent small left pleural effusion. No pneumothorax is identified. IMPRESSION: Unchanged bibasilar atelectasis and small left pleural effusion. Electronically Signed   By: Logan Bores M.D.   On: 12/31/2017 09:18   Dg Chest Port 1  View  Result Date: 12/30/2017 CLINICAL DATA:  Pleural effusion EXAM: PORTABLE CHEST 1 VIEW COMPARISON:  December 29, 2017 FINDINGS: There is mild bibasilar atelectasis with small left pleural effusion. Lungs elsewhere are clear. Heart is mildly enlarged with mild pulmonary venous hypertension. There is aortic atherosclerosis. Central catheter tip is in the superior vena cava near the cavoatrial junction. No pneumothorax. There are surgical clips in the upper right chest medially. Patient is status post coronary artery bypass grafting. IMPRESSION: Bibasilar atelectasis with small left pleural effusion. No consolidation. Stable cardiomegaly. There is aortic atherosclerosis. Central catheter position unchanged. Aortic Atherosclerosis (ICD10-I70.0). Electronically Signed   By: Lowella Grip III M.D.   On: 12/30/2017 09:35   Dg Chest Port 1 View  Result Date: 12/29/2017 CLINICAL DATA:  Respiratory failure. Shortness of breath. EXAM: PORTABLE CHEST 1 VIEW COMPARISON:  12/28/2017 FINDINGS: Sequelae of prior CABG are again identified. Mediastinal drain has been removed. Left PICC terminates over the lower SVC. The cardiomediastinal silhouette is unchanged with persistent prominence of the superior mediastinum. Aortic atherosclerosis is noted. Mild left basilar opacity is unchanged. A small left pleural effusion is questioned. No pneumothorax is identified. IMPRESSION: 1. Interval removal of mediastinal drain. 2. Unchanged mild left basilar opacity, likely atelectasis. Possible small left pleural effusion. Electronically Signed   By: Logan Bores M.D.   On: 12/29/2017 08:31   Dg Chest Port 1 View  Result Date: 12/28/2017 CLINICAL DATA:  Shortness of Breath EXAM: PORTABLE CHEST 1 VIEW COMPARISON:  12/27/2017 FINDINGS: Endotracheal tube and nasogastric catheter have been removed in the interval. A left-sided PICC line is now seen in satisfactory position. Mediastinal drain is again noted. Cardiac shadow is  enlarged but stable. Postsurgical changes are noted. Mild bibasilar atelectasis is seen. IMPRESSION: Tubes and lines as described. Mild bibasilar atelectasis slightly increased on the right when compared with the prior exam. Electronically Signed   By: Inez Catalina M.D.   On: 12/28/2017 08:21   Dg Chest Port 1 View  Result Date: 12/27/2017 CLINICAL DATA:  Check endotracheal tube placement EXAM: PORTABLE CHEST 1 VIEW COMPARISON:  12/26/2017 FINDINGS: Cardiac shadow is stable. Postsurgical changes are again seen. Endotracheal tube and nasogastric catheter as well as a left jugular central line are seen and stable. The lungs are well aerated bilaterally. Very minimal left pleural effusion and atelectatic changes are seen. No pneumothorax is noted. Mediastinal drain remains in place. IMPRESSION: Tubes and lines as described. Mild left basilar atelectasis and small effusion is seen. Electronically Signed   By: Inez Catalina M.D.   On: 12/27/2017 08:06   Dg Chest Port 1 View  Result Date: 12/26/2017 CLINICAL DATA:  Endotracheal tube.  Respiratory difficulty.  EXAM: PORTABLE CHEST 1 VIEW COMPARISON:  12/22/2017 FINDINGS: Endotracheal tube placed. Tip is 4.5 cm from the carina. NG tube placed. The tip is beyond the gastroesophageal junction. Normal heart size. The upper mediastinum is prominent. Lungs are under aerated and grossly clear. No pneumothorax or pleural effusion. IMPRESSION: Endotracheal and NG tubes as described. Prominent upper mediastinum. This may be related to postoperative changes. Electronically Signed   By: Marybelle Killings M.D.   On: 12/26/2017 14:59   Dg Swallowing Func-speech Pathology  Result Date: 01/11/2018 Objective Swallowing Evaluation: Type of Study: MBS-Modified Barium Swallow Study  Patient Details Name: Emily Rocha MRN: [id number] Date of Birth: 1955/06/12 Today's Date: 01/11/2018 Time: SLP Start Time (ACUTE ONLY): 7989 -SLP Stop Time (ACUTE ONLY): 1328 SLP Time Calculation (min)  (ACUTE ONLY): 16 min Past Medical History: Past Medical History: Diagnosis Date . Asthma  . COPD (chronic obstructive pulmonary disease) ([place])  . Coronary artery disease   hx CABG . GERD (gastroesophageal reflux disease)  . Pneumonia  . Shortness of breath dyspnea  . Stroke [facility])   "mini stroke" numbness and weakness to left side of the body Past Surgical History: Past Surgical History: Procedure Laterality Date . AORTIC ARCH ANGIOGRAPHY N/A 11/04/2017  Procedure: AORTIC ARCH ANGIOGRAPHY;  Surgeon: Elam Dutch, MD;  Location: [place] CV LAB;  Service: Cardiovascular;  Laterality: N/A; . APPENDECTOMY   . APPLICATION OF WOUND VAC N/A 21/19/4174  Procedure: APPLICATION OF WOUND VAC;  Surgeon: Emily Rocha, Collier Salina, MD;  Location: [place];  Service: Thoracic;  Laterality: N/A; . CAROTID-SUBCLAVIAN BYPASS GRAFT Right 12/26/2017  Procedure: AORTIC TO RIGHT COMMON CAROTID AND RIGHT SUBCLAVIAN  ARTERY  BYPASS;  Surgeon: Elam Dutch, MD;  Location: [place];  Service: Vascular;  Laterality: Right; . CHOLECYSTECTOMY   . CLOSURE OF DIAPHRAGM  12/26/2017  Procedure: REPAIR OF DIAPHRAGM;  Surgeon: Ivin Poot, MD;  Location: [place];  Service: Thoracic;; . COLONOSCOPY N/A 10/03/2014  Procedure: COLONOSCOPY;  Surgeon: Rogene Houston, MD;  [facility];  Service: Endoscopy;  Laterality: N/A;  730 . CORONARY ARTERY BYPASS GRAFT N/A 12/14/2016  Procedure: CORONARY ARTERY BYPASS GRAFTING (CABG) x three , using left internal mammary artery and right leg greater saphenous vein harvested endoscopically;  Surgeon: Ivin Poot, MD;  Location: [place];  Service: Open Heart Surgery;  Laterality: N/A; . ENDARTERECTOMY Right 12/06/2014  Procedure: ENDARTERECTOMY CAROTid;  Surgeon: Elam Dutch, MD;  Location: [facility] OR;  Service: Vascular;  Laterality: Right; . ESOPHAGOGASTRODUODENOSCOPY N/A 12/03/2016  Procedure: ESOPHAGOGASTRODUODENOSCOPY (EGD);  Surgeon: Rogene Houston, MD;  [facility];  Service:  Endoscopy;  Laterality: N/A;  7:30 . LEFT HEART CATH AND CORONARY ANGIOGRAPHY N/A 12/14/2016  Procedure: LEFT HEART CATH AND CORONARY ANGIOGRAPHY;  Surgeon: Burnell Blanks, MD;  Location: [place] [facility];  Service: Cardiovascular;  Laterality: N/A; . PERIPHERAL VASCULAR CATHETERIZATION N/A 12/05/2014  Procedure:  Carotid Arch Angiography;  Surgeon: Conrad Luna, MD;  Location: [place] [facility] LAB;  Service: Cardiovascular;  Laterality: N/A; . STERNAL WOUND DEBRIDEMENT N/A 01/06/2018  Procedure: STERNAL WOUND DEBRIDEMENT;  Surgeon: Ivin Poot, MD;  Location: [place];  Service: Thoracic;  Laterality: N/A; . STERNOTOMY N/A 12/26/2017  Procedure: REDO STERNOTOMY;  Surgeon: Emily Rocha, Collier Salina, MD;  Location: [place];  Service: Thoracic;  Laterality: N/A; . TEE WITHOUT CARDIOVERSION N/A 12/14/2016  Procedure: TRANSESOPHAGEAL ECHOCARDIOGRAM (TEE);  Surgeon: Emily Rocha, Collier Salina, MD;  Location: [place];  Service: Open Heart Surgery;  Laterality: N/A; HPI: Pt is a 62 year old female presented 12/26/17 for redo sternotomy with aortic to right common carotid and right subclavian artery bypass surgery - intubated for 1 day (11/11-11/12). PMH heavy tobacco use (quit 1 year ago), asthma, GERD, pneumonia, SOB, PVD, stroke. Pt also had CABG x3 1 year ago; was recovering well until presented with 2-3 episodes TIA in Oct 2019. CXR showed mild bibasilar atelectasis slightly increased on right comapred to last exam. Per chart, swallow eval requested due to concern for possible nerve damage from surgery. MBS on 12/28/17 recc D3/Honey. MBS 01/03/18 recc D3/Nectar.  No data recorded Assessment / Plan / Recommendation CHL IP CLINICAL IMPRESSIONS 01/11/2018 Clinical Impression Pt demonstrates improved swallow function and pharyngeal sensation in comparison to last MBSS (01/03/18), however pharyngeal dysphagia still present with thin liquids in the absence of compensatory postural maneuvers. Limited anterior/superior hyolaryngeal  movement and intermittent swallow initiation at the pyriform sinuses resulted in penetration and aspiration of thin liquids. Use of a straw to deliver thin bolus was noted to exacerbate the delay in swallow initiation and result in more frank aspiration. Pt's reflexive cough response provided only partial clearance of penetrates/aspirates. Although a breath hold strategy was ineffective to aid in further airway protection, implementation of chin tuck maneuver with thin proved highly effective, as only 1 instance of flash penetration was observed. Nectar thick and regular textures were also consumed without airway intrusion. SLP emphasized importance of pt's strict adherance to chin tuck strategy with small thin boluses. Recommend upgrade to regular texture, thin liquids with chin tuck and FULL supervision, NO STRAWS, take small bites/sips, continue meds whole in applesauce. ST will continue to follow acutely to provide further education and compensatory strategy training. Pt would benefit from continued home health ST upon d/c in order to ensure diet safety, efficiency, and determine readiness for repeat MBSS to assess opportunity to wean use of chin tuck. SLP Visit Diagnosis Dysphagia, pharyngeal phase (R13.13) Attention and concentration deficit following -- Frontal lobe and executive function deficit following -- Impact on safety and function Moderate aspiration risk   CHL IP TREATMENT RECOMMENDATION 01/11/2018 Treatment Recommendations Therapy as outlined in treatment plan below   Prognosis 01/03/2018 Prognosis for Safe Diet Advancement Good Barriers to Reach Goals -- Barriers/Prognosis Comment -- CHL IP DIET RECOMMENDATION 01/11/2018 SLP Diet Recommendations Thin liquid;Regular solids Liquid Administration via Cup;No straw Medication Administration Whole meds with puree Compensations Minimize environmental distractions;Slow rate;Small sips/bites;Chin tuck Postural Changes Seated upright at 90 degrees   CHL IP  OTHER RECOMMENDATIONS 01/11/2018 Recommended Consults -- Oral Care Recommendations Oral care BID Other Recommendations --   CHL IP FOLLOW UP RECOMMENDATIONS 01/11/2018 Follow up Recommendations Home health SLP   CHL IP FREQUENCY AND DURATION 01/11/2018 Speech Therapy Frequency (ACUTE ONLY) min 2x/week Treatment Duration --      CHL IP ORAL PHASE 01/11/2018 Oral Phase Impaired Oral - Pudding Teaspoon -- Oral - Pudding Cup -- Oral - Honey Teaspoon -- Oral - Honey Cup NT Oral - Nectar Teaspoon -- Oral - Nectar Cup Lingual pumping Oral - Nectar Straw -- Oral - Thin Teaspoon -- Oral - Thin Cup WFL Oral - Thin Straw WFL Oral - Puree NT Oral - Mech Soft -- Oral - Regular WFL Oral - Multi-Consistency -- Oral - Pill -- Oral Phase - Comment --  CHL IP PHARYNGEAL PHASE 01/11/2018 Pharyngeal Phase Impaired Pharyngeal- Pudding Teaspoon -- Pharyngeal -- Pharyngeal- Pudding Cup -- Pharyngeal -- Pharyngeal- Honey Teaspoon NT Pharyngeal -- Pharyngeal- Honey Cup NT Pharyngeal --  Pharyngeal- Nectar Teaspoon -- Pharyngeal -- Pharyngeal- Nectar Cup Reduced anterior laryngeal mobility;Reduced laryngeal elevation Pharyngeal Material does not enter airway Pharyngeal- Nectar Straw NT Pharyngeal -- Pharyngeal- Thin Teaspoon -- Pharyngeal -- Pharyngeal- Thin Cup Penetration/Aspiration during swallow;Reduced anterior laryngeal mobility;Reduced laryngeal elevation;Compensatory strategies attempted (with notebox);Delayed swallow initiation-pyriform sinuses Pharyngeal Material enters airway, passes BELOW cords and not ejected out despite cough attempt by patient;Material enters airway, remains ABOVE vocal cords then ejected out Pharyngeal- Thin Straw Penetration/Aspiration during swallow;Reduced anterior laryngeal mobility;Reduced laryngeal elevation;Delayed swallow initiation-pyriform sinuses Pharyngeal Material enters airway, passes BELOW cords and not ejected out despite cough attempt by patient;Material enters airway, CONTACTS cords and not  ejected out Pharyngeal- Puree NT Pharyngeal -- Pharyngeal- Mechanical Soft -- Pharyngeal -- Pharyngeal- Regular WFL Pharyngeal -- Pharyngeal- Multi-consistency -- Pharyngeal -- Pharyngeal- Pill NT Pharyngeal -- Pharyngeal Comment --  CHL IP CERVICAL ESOPHAGEAL PHASE 01/11/2018 Cervical Esophageal Phase WFL Pudding Teaspoon -- Pudding Cup -- Honey Teaspoon -- Honey Cup -- Nectar Teaspoon -- Nectar Cup -- Nectar Straw -- Thin Teaspoon -- Thin Cup -- Thin Straw -- Puree -- Mechanical Soft -- Regular -- Multi-consistency -- Pill -- Cervical Esophageal Comment -- [place] Siren 01/11/2018, 3:45 PM Orbie Pyo Colvin Caroli.Ed Actor Pager [phone] Office [phone]              Dg Swallowing Func-speech Pathology  Result Date: 01/03/2018 Objective Swallowing Evaluation: Type of Study: MBS-Modified Barium Swallow Study  Patient Details Name: Emily Rocha MRN: [id number] Date of Birth: 27-Oct-1955 Today's Date: 01/03/2018 Time: SLP Start Time (ACUTE ONLY): 1040 -SLP Stop Time (ACUTE ONLY): 1105 SLP Time Calculation (min) (ACUTE ONLY): 25 min Past Medical History: Past Medical History: Diagnosis Date . Asthma  . COPD (chronic obstructive pulmonary disease) ([place])  . Coronary artery disease   hx CABG . GERD (gastroesophageal reflux disease)  . Pneumonia  . Shortness of breath dyspnea  . Stroke [facility])   "mini stroke" numbness and weakness to left side of the body Past Surgical History: Past Surgical History: Procedure Laterality Date . AORTIC ARCH ANGIOGRAPHY N/A 11/04/2017  Procedure: AORTIC ARCH ANGIOGRAPHY;  Surgeon: Elam Dutch, MD;  Location: [place] [facility];  Service: Cardiovascular;  Laterality: N/A; . APPENDECTOMY   . CAROTID-SUBCLAVIAN BYPASS GRAFT Right 12/26/2017  Procedure: AORTIC TO RIGHT COMMON CAROTID AND RIGHT SUBCLAVIAN  ARTERY  BYPASS;  Surgeon: Elam Dutch, MD;  Location: [place];  Service: Vascular;  Laterality: Right; . CHOLECYSTECTOMY   . CLOSURE OF DIAPHRAGM   12/26/2017  Procedure: REPAIR OF DIAPHRAGM;  Surgeon: Ivin Poot, MD;  Location: [place];  Service: Thoracic;; . COLONOSCOPY N/A 10/03/2014  Procedure: COLONOSCOPY;  Surgeon: Rogene Houston, MD;  [facility];  Service: Endoscopy;  Laterality: N/A;  730 . CORONARY ARTERY BYPASS GRAFT N/A 12/14/2016  Procedure: CORONARY ARTERY BYPASS GRAFTING (CABG) x three , using left internal mammary artery and right leg greater saphenous vein harvested endoscopically;  Surgeon: Ivin Poot, MD;  Location: [place];  Service: Open Heart Surgery;  Laterality: N/A; . ENDARTERECTOMY Right 12/06/2014  Procedure: ENDARTERECTOMY CAROTid;  Surgeon: Elam Dutch, MD;  Location[facility];  Service: Vascular;  Laterality: Right; . ESOPHAGOGASTRODUODENOSCOPY N/A 12/03/2016  Procedure: ESOPHAGOGASTRODUODENOSCOPY (EGD);  Surgeon: Rogene Houston, MD;  [facility];  Service: Endoscopy;  Laterality: N/A;  7:30 . LEFT HEART CATH AND CORONARY ANGIOGRAPHY N/A 12/14/2016  Procedure: LEFT HEART CATH AND CORONARY ANGIOGRAPHY;  Surgeon: Burnell Blanks, MD;  Location:  [place] IN[facility];  Service: Cardiovascular;  Laterality: N/A; . PERIPHERAL VASCULAR CATHETERIZATION N/A 12/05/2014  Procedure:  Carotid Arch Angiography;  Surgeon: Conrad Kotlik, MD;  Location: [place] [facility];  Service: Cardiovascular;  Laterality: N/A; . STERNOTOMY N/A 12/26/2017  Procedure: REDO STERNOTOMY;  Surgeon: Emily Rocha, Collier Salina, MD;  Location: [place];  Service: Thoracic;  Laterality: N/A; . TEE WITHOUT CARDIOVERSION N/A 12/14/2016  Procedure: TRANSESOPHAGEAL ECHOCARDIOGRAM (TEE);  Surgeon: Emily Rocha, Collier Salina, MD;  Location: [place];  Service: Open Heart Surgery;  Laterality: N/A; HPI: Pt is a 62 year old female presented 12/26/17 for redo sternotomy with aortic to right common carotid and right subclavian artery bypass surgery - intubated for 1 day (11/11-11/12). PMH heavy tobacco use (quit 1 year ago), asthma, GERD, pneumonia, SOB, PVD,  stroke. Pt also had CABG x3 1 year ago; was recovering well until presented with 2-3 episodes TIA in Oct 2019. CXR showed mild bibasilar atelectasis slightly increased on right comapred to last exam. Per chart, swallow eval requested due to concern for possible nerve damage from surgery. MBS on 12/28/17 recc D3/Honey.  No data recorded Assessment / Plan / Recommendation CHL IP CLINICAL IMPRESSIONS 01/03/2018 Clinical Impression Pt continues to demonstrate silent penetration and sensed aspiration of thin liquids during the swallow, though nectar thick liquids are consistently tolerated in contrast with prior study. Pt initiates laryngeal elevation, but does not complete hyoid excursion until the bolus has reached the pyriforms. This pushes small amounts of thin liquid into the vestibule prior to contact between the arytenoids and epiglottic surface to seal to laryngeal vestibule. With large boluses of thin this does reasult in aspiration. Positional strategies including head turn all ineffective. Pt did best with cues to take a small sip, hold orally, hold breath/bear down, and swallow, which encourages early laryngeal closure. Suspect that in addition to timing problem there may also be decreased adduction of the right arytenoid for adequate/rapid laryngeal seal given pts hix of surgery and persistent dysphonia. Would encourage pt to f/u with ENT if dysphonia and dysphagia persist to one month. For now, pt to consume regular texture foods, nectar thick liquids with any needed strategies. May have thin water if able to attend and take care sips with the aforementioned strategies. Pt to f/u with home health SLP.  SLP Visit Diagnosis Dysphagia, unspecified (R13.10) Attention and concentration deficit following -- Frontal lobe and executive function deficit following -- Impact on safety and function --   CHL IP TREATMENT RECOMMENDATION 01/03/2018 Treatment Recommendations Therapy as outlined in treatment plan below    Prognosis 01/03/2018 Prognosis for Safe Diet Advancement Good Barriers to Reach Goals -- Barriers/Prognosis Comment -- CHL IP DIET RECOMMENDATION 01/03/2018 SLP Diet Recommendations Dysphagia 3 (Mech soft) solids;Nectar thick liquid;Thin liquid Liquid Administration via Cup Medication Administration Whole meds with liquid Compensations Slow rate;Small sips/bites;Minimize environmental distractions Postural Changes --   CHL IP OTHER RECOMMENDATIONS 12/28/2017 Recommended Consults -- Oral Care Recommendations Oral care BID Other Recommendations Order thickener from pharmacy   CHL IP FOLLOW UP RECOMMENDATIONS 01/03/2018 Follow up Recommendations Home health SLP   CHL IP FREQUENCY AND DURATION 01/03/2018 Speech Therapy Frequency (ACUTE ONLY) min 2x/week Treatment Duration --      CHL IP ORAL PHASE 01/03/2018 Oral Phase WFL Oral - Pudding Teaspoon -- Oral - Pudding Cup -- Oral - Honey Teaspoon -- Oral - Honey Cup -- Oral - Nectar Teaspoon -- Oral - Nectar Cup -- Oral - Nectar Straw -- Oral - Thin Teaspoon -- Oral -  Thin Cup -- Oral - Thin Straw -- Oral - Puree -- Oral - Mech Soft -- Oral - Regular -- Oral - Multi-Consistency -- Oral - Pill -- Oral Phase - Comment --  CHL IP PHARYNGEAL PHASE 01/03/2018 Pharyngeal Phase Impaired Pharyngeal- Pudding Teaspoon -- Pharyngeal -- Pharyngeal- Pudding Cup -- Pharyngeal -- Pharyngeal- Honey Teaspoon -- Pharyngeal -- Pharyngeal- Honey Cup NT Pharyngeal -- Pharyngeal- Nectar Teaspoon -- Pharyngeal -- Pharyngeal- Nectar Cup Reduced airway/laryngeal closure;Delayed swallow initiation-pyriform sinuses Pharyngeal Material does not enter airway Pharyngeal- Nectar Straw Reduced airway/laryngeal closure;Delayed swallow initiation-pyriform sinuses Pharyngeal Material does not enter airway Pharyngeal- Thin Teaspoon -- Pharyngeal -- Pharyngeal- Thin Cup Reduced airway/laryngeal closure;Penetration/Aspiration before swallow;Penetration/Aspiration during swallow;Delayed swallow  initiation-pyriform sinuses;Trace aspiration Pharyngeal Material enters airway, passes BELOW cords then ejected out;Material enters airway, CONTACTS cords and then ejected out;Material does not enter airway Pharyngeal- Thin Straw Reduced airway/laryngeal closure;Penetration/Aspiration before swallow;Penetration/Aspiration during swallow;Delayed swallow initiation-pyriform sinuses Pharyngeal Material enters airway, remains ABOVE vocal cords and not ejected out;Material does not enter airway;Material enters airway, passes BELOW cords then ejected out Pharyngeal- Puree WFL Pharyngeal -- Pharyngeal- Mechanical Soft -- Pharyngeal -- Pharyngeal- Regular WFL Pharyngeal -- Pharyngeal- Multi-consistency -- Pharyngeal -- Pharyngeal- Pill WFL Pharyngeal -- Pharyngeal Comment --  CHL IP CERVICAL ESOPHAGEAL PHASE 12/28/2017 Cervical Esophageal Phase WFL Pudding Teaspoon -- Pudding Cup -- Honey Teaspoon -- Honey Cup -- Nectar Teaspoon -- Nectar Cup -- Nectar Straw -- Thin Teaspoon -- Thin Cup -- Thin Straw -- Puree -- Mechanical Soft -- Regular -- Multi-consistency -- Pill -- Cervical Esophageal Comment -- Herbie Baltimore, MA CCC-SLP Acute Rehabilitation Services Pager [phone] Office [phone] Lynann Beaver 01/03/2018, 11:25 AM              Dg Swallowing Func-speech Pathology  Result Date: 12/28/2017 Objective Swallowing Evaluation: Type of Study: MBS-Modified Barium Swallow Study  Patient Details Name: Emily Rocha MRN: [id number] Date of Birth: 1955/03/14 Today's Date: 12/28/2017 Time: SLP Start Time (ACUTE ONLY): 1309 -SLP Stop Time (ACUTE ONLY): 1330 SLP Time Calculation (min) (ACUTE ONLY): 21 min Past Medical History: Past Medical History: Diagnosis Date . Asthma  . COPD (chronic obstructive pulmonary disease) ([place])  . Coronary artery disease   hx CABG . GERD (gastroesophageal reflux disease)  . Pneumonia  . Shortness of breath dyspnea  . Stroke [facility])   "mini stroke" numbness and weakness to left side of  the body Past Surgical History: Past Surgical History: Procedure Laterality Date . AORTIC ARCH ANGIOGRAPHY N/A 11/04/2017  Procedure: AORTIC ARCH ANGIOGRAPHY;  Surgeon: Elam Dutch, MD;  Location: [place] [facility];  Service: Cardiovascular;  Laterality: N/A; . APPENDECTOMY   . CAROTID-SUBCLAVIAN BYPASS GRAFT Right 12/26/2017  Procedure: AORTIC TO RIGHT COMMON CAROTID AND RIGHT SUBCLAVIAN  ARTERY  BYPASS;  Surgeon: Elam Dutch, MD;  Location: [place];  Service: Vascular;  Laterality: Right; . CHOLECYSTECTOMY   . CLOSURE OF DIAPHRAGM  12/26/2017  Procedure: REPAIR OF DIAPHRAGM;  Surgeon: Ivin Poot, MD;  Location: [place];  Service: Thoracic;; . COLONOSCOPY N/A 10/03/2014  Procedure: COLONOSCOPY;  Surgeon: Rogene Houston, MD;  [facility];  Service: Endoscopy;  Laterality: N/A;  730 . CORONARY ARTERY BYPASS GRAFT N/A 12/14/2016  Procedure: CORONARY ARTERY BYPASS GRAFTING (CABG) x three , using left internal mammary artery and right leg greater saphenous vein harvested endoscopically;  Surgeon: Ivin Poot, MD;  Location: [place];  Service: Open Heart Surgery;  Laterality: N/A; . ENDARTERECTOMY Right 12/06/2014  Procedure: ENDARTERECTOMY CAROTid;  Surgeon: Elam Dutch, MD;  Location: [facility] OR;  Service: Vascular;  Laterality: Right; . ESOPHAGOGASTRODUODENOSCOPY N/A 12/03/2016  Procedure: ESOPHAGOGASTRODUODENOSCOPY (EGD);  Surgeon: Rogene Houston, MD;  [facility];  Service: Endoscopy;  Laterality: N/A;  7:30 . LEFT HEART CATH AND CORONARY ANGIOGRAPHY N/A 12/14/2016  Procedure: LEFT HEART CATH AND CORONARY ANGIOGRAPHY;  Surgeon: Burnell Blanks, MD;  Location: [place] [facility];  Service: Cardiovascular;  Laterality: N/A; . PERIPHERAL VASCULAR CATHETERIZATION N/A 12/05/2014  Procedure:  Carotid Arch Angiography;  Surgeon: Conrad Lockhart, MD;  Location: [place] [facility];  Service: Cardiovascular;  Laterality: N/A; . STERNOTOMY N/A 12/26/2017  Procedure: REDO  STERNOTOMY;  Surgeon: Emily Rocha, Collier Salina, MD;  Location: [place];  Service: Thoracic;  Laterality: N/A; . TEE WITHOUT CARDIOVERSION N/A 12/14/2016  Procedure: TRANSESOPHAGEAL ECHOCARDIOGRAM (TEE);  Surgeon: Emily Rocha, Collier Salina, MD;  Location: [place];  Service: Open Heart Surgery;  Laterality: N/A; HPI: Pt is a 62 year old female presented 12/26/17 for redo sternotomy with aortic to right common carotid and right subclavian artery bypass surgery - intubated for 1 day (11/11-11/12). PMH heavy tobacco use (quit 1 year ago), asthma, GERD, pneumonia, SOB, PVD, stroke. Pt also had CABG x3 1 year ago; was recovering well until presented with 2-3 episodes TIA in Oct 2019. CXR showed mild bibasilar atelectasis slightly increased on right comapred to last exam. Per chart, swallow eval requested due to concern for possible nerve damage from surgery.  No data recorded Assessment / Plan / Recommendation CHL IP CLINICAL IMPRESSIONS 12/28/2017 Clinical Impression Pt presents with primarily sensory pharyngeal dysphagia characterized by delayed swallow initiation and timing of laryngeal closure with thin and nectar liquids. Nearly all trials of thin and nectar reached the pyriform sinuses before swallow initiation and were subsequently penetrated to the level of the vocal folds. Some instances of penetration into laryngeal vestibule prior to swallow trigger also observed. Pt required cues to cough in order to clear penetrates. Compensatory chin tuck maneuver attempted with thin and nectar, however it was ineffective to aid in more timely swallow initiation and airway protection. Honey thick liquids were deglutated in a much more timely manner and with no instances of airway intrusion in 4 out of 4 trials. Pt also demonstrated efficient consumption of puree and regular texture. Recommend dysphagia 3 (mechanical soft) diet, honey thick liquids, take small bites and sips and clear throat intermittently throughout meals. ST will continue to  follow to provide treatment with diet safety, efficiency, and opportunities for advancement.  SLP Visit Diagnosis Dysphagia, pharyngeal phase (R13.13) Attention and concentration deficit following -- Frontal lobe and executive function deficit following -- Impact on safety and function Moderate aspiration risk   CHL IP TREATMENT RECOMMENDATION 12/28/2017 Treatment Recommendations Therapy as outlined in treatment plan below   Prognosis 12/28/2017 Prognosis for Safe Diet Advancement Good Barriers to Reach Goals -- Barriers/Prognosis Comment -- CHL IP DIET RECOMMENDATION 12/28/2017 SLP Diet Recommendations Dysphagia 3 (Mech soft) solids;Honey thick liquids Liquid Administration via Cup;Spoon Medication Administration Crushed with puree Compensations Minimize environmental distractions;Slow rate;Small sips/bites;Clear throat intermittently Postural Changes Seated upright at 90 degrees;Remain semi-upright after after feeds/meals (Comment)   CHL IP OTHER RECOMMENDATIONS 12/28/2017 Recommended Consults -- Oral Care Recommendations Oral care BID Other Recommendations Order thickener from pharmacy   CHL IP FOLLOW UP RECOMMENDATIONS 12/28/2017 Follow up Recommendations Skilled Nursing facility   [facility] IP FREQUENCY AND DURATION 12/28/2017 Speech Therapy Frequency (ACUTE ONLY) min 2x/week Treatment Duration 2 weeks      CHL  IP ORAL PHASE 12/28/2017 Oral Phase Impaired Oral - Pudding Teaspoon -- Oral - Pudding Cup -- Oral - Honey Teaspoon -- Oral - Honey Cup Lingual pumping Oral - Nectar Teaspoon -- Oral - Nectar Cup WFL Oral - Nectar Straw -- Oral - Thin Teaspoon -- Oral - Thin Cup -- Oral - Thin Straw -- Oral - Puree WFL Oral - Mech Soft -- Oral - Regular WFL Oral - Multi-Consistency -- Oral - Pill -- Oral Phase - Comment --  CHL IP PHARYNGEAL PHASE 12/28/2017 Pharyngeal Phase Impaired Pharyngeal- Pudding Teaspoon -- Pharyngeal -- Pharyngeal- Pudding Cup -- Pharyngeal -- Pharyngeal- Honey Teaspoon -- Pharyngeal -- Pharyngeal-  Honey Cup WFL Pharyngeal -- Pharyngeal- Nectar Teaspoon -- Pharyngeal -- Pharyngeal- Nectar Cup Delayed swallow initiation-pyriform sinuses;Penetration/Aspiration during swallow Pharyngeal Material enters airway, CONTACTS cords and not ejected out Pharyngeal- Nectar Straw -- Pharyngeal -- Pharyngeal- Thin Teaspoon -- Pharyngeal -- Pharyngeal- Thin Cup Delayed swallow initiation-pyriform sinuses;Penetration/Aspiration during swallow Pharyngeal Material enters airway, CONTACTS cords and not ejected out Pharyngeal- Thin Straw -- Pharyngeal -- Pharyngeal- Puree WFL Pharyngeal -- Pharyngeal- Mechanical Soft -- Pharyngeal -- Pharyngeal- Regular WFL Pharyngeal -- Pharyngeal- Multi-consistency -- Pharyngeal -- Pharyngeal- Pill -- Pharyngeal -- Pharyngeal Comment --  CHL IP CERVICAL ESOPHAGEAL PHASE 12/28/2017 Cervical Esophageal Phase WFL Pudding Teaspoon -- Pudding Cup -- Honey Teaspoon -- Honey Cup -- Nectar Teaspoon -- Nectar Cup -- Nectar Straw -- Thin Teaspoon -- Thin Cup -- Thin Straw -- Puree -- Mechanical Soft -- Regular -- Multi-consistency -- Pill -- Cervical Esophageal Comment -- [place] Siren 12/28/2017, 2:42 PM    Orbie Pyo Litaker M.Ed Risk analyst [phone] Office [phone]           Vas [place] Upper Extremity Venous Duplex  Result Date: 01/02/2018 UPPER VENOUS STUDY  Indications: Edema, and PICC line Performing Technologist: Sharion Dove RVS  Examination Guidelines: A complete evaluation includes B-mode imaging, spectral Doppler, color Doppler, and power Doppler as needed of all accessible portions of each vessel. Bilateral testing is considered an integral part of a complete examination. Limited examinations for reoccurring indications may be performed as noted.  Right Findings: +----------+------------+----------+---------+-----------+-------+ RIGHT     CompressiblePropertiesPhasicitySpontaneousSummary  +----------+------------+----------+---------+-----------+-------+ Subclavian                         Yes       Yes            +----------+------------+----------+---------+-----------+-------+  Left Findings: +----------+------------+----------+---------+-----------+-------+ LEFT      CompressiblePropertiesPhasicitySpontaneousSummary +----------+------------+----------+---------+-----------+-------+ IJV         Partial                Yes       Yes     Acute  +----------+------------+----------+---------+-----------+-------+ Subclavian    None                                   Acute  +----------+------------+----------+---------+-----------+-------+ Axillary      None                                   Acute  +----------+------------+----------+---------+-----------+-------+ Brachial      Full                                          +----------+------------+----------+---------+-----------+-------+  Radial        Full                                          +----------+------------+----------+---------+-----------+-------+ Ulnar         Full                                          +----------+------------+----------+---------+-----------+-------+ Cephalic      None                                   Acute  +----------+------------+----------+---------+-----------+-------+ Basilic       Full                                          +----------+------------+----------+---------+-----------+-------+  Summary:  Right: No evidence of thrombosis in the subclavian.  Left: Findings consistent with acute deep vein thrombosis involving the left internal jugular veins, left subclavian veins and left axillary vein. Findings consistent with acute superficial vein thrombosis involving the left cephalic vein.  *See table(s) above for measurements and observations.  Diagnosing physician: Servando Snare MD Electronically signed by Servando Snare MD on 01/02/2018 at  4:24:00 PM.    Final    [place] Ekg Site Rite  Result Date: 12/27/2017 If Site Rite image not attached, placement could not be confirmed due to current cardiac rhythm.    Discharge Medications: Allergies as of 01/13/2018   No Known Allergies     Medication List    STOP taking these medications   clopidogrel 75 MG tablet Commonly known as:  PLAVIX     TAKE these medications   ALPRAZolam 1 MG tablet Commonly known as:  XANAX Take 1 mg by mouth 3 (three) times daily as needed for anxiety.   aspirin 81 MG chewable tablet Chew 81 mg by mouth daily.   atorvastatin 40 MG tablet Commonly known as:  LIPITOR Take 1 tablet (40 mg total) daily by mouth.   doxycycline 100 MG EC tablet Commonly known as:  DORYX Take 1 tablet (100 mg total) by mouth 2 (two) times daily.   metoprolol succinate 50 MG 24 hr tablet Commonly known as:  TOPROL-XL Take 50 mg by mouth daily. Take with or immediately following a meal.   multivitamin with minerals Tabs tablet Take 1 tablet by mouth daily.   oxyCODONE 5 MG immediate release tablet Commonly known as:  Oxy IR/ROXICODONE Take 5 mg by mouth every 4-6 hours PRN severe pain   pantoprazole 40 MG tablet Commonly known as:  PROTONIX Take 40 mg by mouth daily.   potassium chloride SA 20 MEQ tablet Commonly known as:  K-DUR,KLOR-CON Take 20 mEq by mouth 2 (two) times daily.   SYMBICORT 160-4.5 MCG/ACT inhaler Generic drug:  budesonide-formoterol Inhale 2 puffs into the lungs 2 (two) times daily.   torsemide 20 MG tablet Commonly known as:  DEMADEX Take 2 tablets (40 mg total) by mouth daily.   VENTOLIN HFA 108 (90 Base) MCG/ACT inhaler Generic drug:  albuterol Inhale 2 puffs into the lungs every 6 (six) hours as needed for wheezing or shortness of breath.  Vitamin D 50 MCG (2000 UT) Caps Take 2,000 Units by mouth daily.   warfarin 2 MG tablet Commonly known as:  COUMADIN Take 1 tablet (2 mg total) by mouth daily at 6 PM.      The  patient has been discharged on:   1.Beta Blocker:  Yes [ x  ]                              No   [   ]                              If No, reason:  2.Ace Inhibitor/ARB: Yes [   ]                                     No  [   x ]                                     If No, reason:  3.Statin:   Yes [x   ]                  No  [   ]                  If No, reason:  4.Ecasa:  Yes  [ x  ]                  No   [   ]                  If No, reason:  Follow Up Appointments: Follow-up Information    [facility]. Go in 2 month(s).   Specialty:  Pulmonology Why:  Patient scheduled for PFT's and consultation with Dr Valeta Harms 02/27/2018 at 10:30 Contact information: [address] [place] [address] [phone]       Elam Dutch, MD. Call.   Specialties:  Vascular Surgery, Cardiology Why:  for a follow up appointment Contact information: [place] [address] [phone]        Ivin Poot, MD. Go on 01/18/2018.   Specialty:  Cardiothoracic Surgery Why:  Office will call with appointment date and time Contact information: [place] [address] [phone]        [place]. Go on 01/16/2018.   Specialty:  Cardiology Why:  Appointment is for PT and INR to be drawn on Monday 01/16/2018. She is on Coumadin for left DVT. Call for appointment time Contact information: [address] [place] [phone]          Signed: Sharalyn Ink [facility] 01/13/2018, 11:29 AM

## 2017-12-28 NOTE — Progress Notes (Signed)
[facility]  Subjective  - feels ok   Objective (!) 124/102 (!) 116 98.2 F (36.8 C) (Oral) (!) 22 95%  Intake/Output Summary (Last 24 hours) at 12/28/2017 0746 Last data filed at 12/28/2017 0600 Gross per 24 hour  Intake 991.01 ml  Output 2450 ml  Net -1458.99 ml   2+ right radial pulse right hand pink warm Neuro UE LE 5/5 some hoarseness  Chest tube 130  Assessment/Planning: Overall doing well Mobilize today Hoarseness probably secondary to ET tube but will eval vocal cords if persists 4-6 weeks Leukocytosis probably reactive trend Acute blood loss anemia no signs of bleeding most likely equilibration trend for now Sinus tachycardia ? Pain vs hypovolemia  [name] 12/28/2017 7:46 AM --  Laboratory Lab Results: Recent Labs    12/27/17 0206 12/28/17 0425  WBC 17.9* 18.2*  HGB 10.1* 8.9*  HCT 30.4* 28.0*  PLT 234 170   BMET Recent Labs    12/27/17 0206 12/28/17 0425  NA 136 132*  K 3.9 3.3*  CL 104 97*  CO2 24 26  GLUCOSE 136* 178*  BUN 11 12  CREATININE 0.57 0.79  CALCIUM 8.0* 8.2*    COAG Lab Results  Component Value Date   INR 1.00 12/22/2017   INR 1.40 12/14/2016   INR 1.01 12/12/2016   No results found for: PTT

## 2017-12-28 NOTE — Progress Notes (Signed)
NAME:  Emily Rocha, MRN:  [id number], DOB:  12-29-1955, LOS: 2 ADMISSION DATE:  12/26/2017, CONSULTATION DATE: 12/27/2017 REFERRING MD: Perivascular thoracic surgery, CHIEF COMPLAINT: Hypercarbia  Brief History   62 year old status post aortic right innominate artery grafting 12/26/2017 extubated 12/27/2017 with hypercarbia post extubation History of present illness   62 year old female smoker up until approximately 1 year ago.  Has a history of peripheral vascular disease had a right carotid endarterectomy and coronary bypass grafting done in the last year.  She quit smoking approximately 1 year ago.  She presents to [facility] for redo sternotomy with a grafting of the Dacron graft to the aorta and to the right innominate artery for cardiovascular thoracic surgery and vascular surgery.  There was a plan for a wedge resection of the right upper lobe for nodule but this was not attempted due to the complexity of her surgery.  She tolerated procedure well was extubated 12/27/2017 but developed some hypercarbia post extubation.  There is some question of whether her sedation had not been off long enough prior to extubation.  She is placed on noninvasive mechanical ventilatory support.  At time of examination she was awake alert moving good air.  We took her off the BiPAP we will repeat gases 1 hours to begin with aggressive pulmonary toilet cough turn deep breathing incentive spirometer flutter valve.  Suspect she has undiagnosed obstructive sleep apnea after discussion with her current husband.  Will make her noninvasive mechanical ventilatory support PRN and nocturnal and monitor her status.  Currently she is awake alert no acute distress she is on low-dose Neo-Synephrine to maintain her systolic blood pressure. Past Medical History  Coronary artery disease status post CABG Carotid endarterectomy 1 year ago Peripheral vascular disease Significant Hospital Events   Extubated 12/27/2017 with mild  hypercarbia  Consults:  12/27/2017 pulmonary critical care  Procedures:  Repeat sternotomy and grafting of a order and right innominate artery 12/2017 Left Cordis 12/26/2017 12/28/2017 swallowing evaluation Significant Diagnostic Tests:    Micro Data:    Antimicrobials:    Interim history/subjective:  Has not required noninvasive mechanical ventilatory support.  She is in no acute distress.  Continue current interventions  Objective   Blood pressure (!) 124/102, pulse (!) 116, temperature 98.2 F (36.8 C), temperature source Oral, resp. rate (!) 22, height 5\' 4"  (1.626 m), weight 89.7 kg, SpO2 95 %.    Vent Mode: BIPAP FiO2 (%):  [40 %] 40 % Set Rate:  [10 bmp] 10 bmp PEEP:  [5 cmH20] 5 cmH20 Pressure Support:  [5 cmH20] 5 cmH20   Intake/Output Summary (Last 24 hours) at 12/28/2017 0843 Last data filed at 12/28/2017 0600 Gross per 24 hour  Intake 856.72 ml  Output 2255 ml  Net -1398.28 ml   Filed Weights   12/26/17 0656 12/27/17 0500 12/28/17 0545  Weight: 83.9 kg 91.9 kg 89.7 kg    Examination: General: Obese female sitting in chair in no acute distress HEENT: Hoarse voice, right neck dressing intact Neuro: Alert and orientated moves all extremities CV: s1s2 rrr, no m/r/g PULM: even/non-labored, lungs bilaterally diminished sternal dressing intact XF:GHWE, non-tender, bsx4 active  Extremities: warm/dry, 1+ edema  Skin: Areas of ecchymosis   Resolved Hospital Problem list     Assessment & Plan:  Vent dependent respiratory failure status post open heart surgery 12/26/2017 Extubated 12/27/2017 Post extubation hypercarbia COPD Suspect undiagnosed obstructive sleep apnea -PRN noninvasive mechanical ventilation -Avoid sedation -Dilators -Aggressive pulmonary toilet -Blood -Swallowing evaluation ordered by  CVTS -O2 as needed to keep sats greater than 92%  Status post redo sternotomy with an asteatosis of a bifurcated Dacron graft to the ascending aorta  for 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  Performed on 12/26/2017 by Dr. Murrell Converse and Dr. Dahlia Byes History of carotid stenosis with carotid endarterectomy on the right History of coronary artery disease status post coronary bypass grafting 1 year ago Congestive heart failure with grade 1 diastolic dysfunction via 2D echo with reported normal ventricular function -Per surgery  History of TIA -Monitor neurological check  History of anxiety -Continue to hold Xanax for now  History of gastroesophageal reflux disease -Continue PPI   Best practice:  Diet: Swallow evaluation has been ordered 12/28/2017 with concern for possible nerve damage from surgery Pain/Anxiety/Delirium protocol (if indicated): PRN narcotics VAP protocol (if indicated): Not indicated DVT prophylaxis: PAS GI prophylaxis: PPI Glucose control:  Mobility: Out of bed advance as tolerated Code Status:full Family Communication: 13 2019 patient updated at bedside Disposition: Change in intensive care unit sitting in chair improved.  Labs   CBC: Recent Labs  Lab 12/22/17 1535  12/26/17 1139 12/26/17 1425 12/26/17 1427 12/27/17 0206 12/28/17 0425  WBC 6.1  --   --  15.6*  --  17.9* 18.2*  HGB 12.7   < > 12.6 10.0* 9.5* 10.1* 8.9*  HCT 39.8   < > 37.0 31.5* 28.0* 30.4* 28.0*  MCV 89.4  --   --  91.3  --  88.9 93.0  PLT 259  --   --  199  --  234 170   < > = values in this interval not displayed.    Basic Metabolic Panel: Recent Labs  Lab 12/22/17 1535  12/26/17 0903 12/26/17 1045 12/26/17 1139 12/26/17 1427 12/27/17 0206 12/28/17 0425  NA 140   < > 139 139 140 141 136 132*  K 3.5   < > 3.9 4.2 4.2 4.3 3.9 3.3*  CL 101  --  99 102  --   --  104 97*  CO2 29  --   --   --   --   --  24 26  GLUCOSE 220*  --  119* 138* 143* 156* 136* 178*  BUN 16  --  14 14  --   --  11 12  CREATININE 0.75  --  0.40* 0.40*  --   --  0.57 0.79  CALCIUM 9.7  --   --    --   --   --  8.0* 8.2*   < > = values in this interval not displayed.   GFR: Estimated Creatinine Clearance: 77 mL/min (by C-G formula based on SCr of 0.79 mg/dL). Recent Labs  Lab 12/22/17 1535 12/26/17 1425 12/27/17 0206 12/28/17 0425  WBC 6.1 15.6* 17.9* 18.2*    Liver Function Tests: Recent Labs  Lab 12/22/17 1535 12/28/17 0425  AST 19 38  ALT 21 28  ALKPHOS 92 68  BILITOT 0.7 1.6*  PROT 7.0 5.3*  ALBUMIN 3.8 2.9*   No results for input(s): LIPASE, AMYLASE in the last 168 hours. No results for input(s): AMMONIA in the last 168 hours.  ABG    Component Value Date/Time   PHART 7.362 12/28/2017 0546   PCO2ART 52.3 (H) 12/28/2017 0546   PO2ART 69.0 (L) 12/28/2017 0546   HCO3 29.6 (H) 12/28/2017 0546   TCO2 31 12/28/2017 0546   O2SAT 92.0 12/28/2017 0546     Coagulation Profile:  Recent Labs  Lab 12/22/17 1535  INR 1.00    Cardiac Enzymes: No results for input(s): CKTOTAL, CKMB, CKMBINDEX, TROPONINI in the last 168 hours.  HbA1C: Hgb A1c MFr Bld  Date/Time Value Ref Range Status  12/22/2017 03:35 PM 6.2 (H) 4.8 - 5.6 % Final    Comment:    (NOTE) Pre diabetes:          5.7%-6.4% Diabetes:              >6.4% Glycemic control for   <7.0% adults with diabetes   12/20/2016 03:04 PM 6.0 (H) 4.8 - 5.6 % Final    Comment:    (NOTE) Pre diabetes:          5.7%-6.4% Diabetes:              >6.4% Glycemic control for   <7.0% adults with diabetes     CBG: Recent Labs  Lab 12/27/17 1341 12/27/17 1617 12/27/17 1935 12/27/17 2316 12/28/17 0317  GLUCAP 141* 117* 123* 103* 116*     Critical care time: 0    Steve Enio Hornback ACNP Maryanna Shape PCCM Pager [phone] till 1 pm If no answer page [phone] 12/28/2017, 8:43 AM

## 2017-12-28 NOTE — Progress Notes (Addendum)
TCTS DAILY ICU PROGRESS NOTE                   [address]            [place] [address]          [phone]   2 Days Post-Op Procedure(s) (LRB): REDO STERNOTOMY (N/A) AORTIC TO RIGHT COMMON CAROTID AND RIGHT SUBCLAVIAN  ARTERY  BYPASS (Right) REPAIR OF DIAPHRAGM  Total Length of Stay:  LOS: 2 days   Subjective: Patient with hoarsenss this am-likely related to ET tube.  Objective: Vital signs in last 24 hours: Temp:  [97.9 F (36.6 C)-100.4 F (38 C)] 98.2 F (36.8 C) (11/13 0400) Pulse Rate:  [85-132] 116 (11/13 0600) Cardiac Rhythm: Sinus tachycardia (11/13 0400) Resp:  [10-47] 22 (11/13 0600) BP: (69-137)/(39-110) 124/102 (11/13 0600) SpO2:  [93 %-100 %] 95 % (11/13 0600) Arterial Line BP: (83-134)/(41-74) 107/45 (11/12 1745) FiO2 (%):  [40 %-50 %] 40 % (11/12 1110) Weight:  [89.7 kg] 89.7 kg (11/13 0545)  Filed Weights   12/26/17 0656 12/27/17 0500 12/28/17 0545  Weight: 83.9 kg 91.9 kg 89.7 kg    Weight change: -2.2 kg       Intake/Output from previous day: 11/12 0701 - 11/13 0700 In: 991 [I.V.:590.9; IV Piggyback:400.1] Out: 2450 [Urine:2295; Emesis/NG output:25; Chest Tube:130]  Intake/Output this shift: No intake/output data recorded.  Current Meds: Scheduled Meds: . acetaminophen  1,000 mg Oral Q6H   Or  . acetaminophen (TYLENOL) oral liquid 160 mg/5 mL  1,000 mg Oral Q6H  . amiodarone  200 mg Oral BID  . atorvastatin  40 mg Oral Daily  . bisacodyl  10 mg Oral Daily  . Chlorhexidine Gluconate Cloth  6 each Topical Daily  . furosemide  20 mg Intravenous BID  . insulin aspart  0-24 Units Subcutaneous Q4H  . levalbuterol  0.63 mg Nebulization Q6H WA  . lidocaine  1 patch Transdermal Q24H  . mouth rinse  15 mL Mouth Rinse BID  . metoCLOPramide (REGLAN) injection  10 mg Intravenous Q6H  . metolazone  5 mg Oral Daily  . mometasone-formoterol  2 puff Inhalation BID  . potassium chloride  20 mEq Oral BID  . senna-docusate  1  tablet Oral QHS  . sodium chloride flush  10-40 mL Intracatheter Q12H  . umeclidinium bromide  1 puff Inhalation Daily   Continuous Infusions: . albumin human    . dextrose 5 % and 0.45% NaCl 10 mL/hr at 12/28/17 0600  . famotidine (PEPCID) IV Stopped (12/27/17 2152)  . phenylephrine (NEO-SYNEPHRINE) Adult infusion Stopped (12/27/17 1745)  . potassium chloride     PRN Meds:.docusate, fentaNYL, fentaNYL (SUBLIMAZE) injection, levalbuterol, ondansetron (ZOFRAN) IV, oxyCODONE, potassium chloride, potassium chloride, sodium chloride flush, traMADol  General appearance: alert and follows commands Neurologic: intact Heart: RRR Lungs: Diminshed at bases Abdomen: Soft, non tender, sporadic bowel sounds Extremities: Mild LE edema Wound: Aquacel intact  Lab Results: CBC: Recent Labs    12/27/17 0206 12/28/17 0425  WBC 17.9* 18.2*  HGB 10.1* 8.9*  HCT 30.4* 28.0*  PLT 234 170   BMET:  Recent Labs    12/27/17 0206 12/28/17 0425  NA 136 132*  K 3.9 3.3*  CL 104 97*  CO2 24 26  GLUCOSE 136* 178*  BUN 11 12  CREATININE 0.57 0.79  CALCIUM 8.0* 8.2*    CMET: Lab Results  Component Value Date   WBC 18.2 (H) 12/28/2017   HGB 8.9 (L)  12/28/2017   HCT 28.0 (L) 12/28/2017   PLT 170 12/28/2017   GLUCOSE 178 (H) 12/28/2017   CHOL 205 (H) 12/13/2016   TRIG 101 12/13/2016   HDL 68 12/13/2016   LDLCALC 117 (H) 12/13/2016   ALT 28 12/28/2017   AST 38 12/28/2017   NA 132 (L) 12/28/2017   K 3.3 (L) 12/28/2017   CL 97 (L) 12/28/2017   CREATININE 0.79 12/28/2017   BUN 12 12/28/2017   CO2 26 12/28/2017   INR 1.00 12/22/2017   HGBA1C 6.2 (H) 12/22/2017      PT/INR: No results for input(s): LABPROT, INR in the last 72 hours. Radiology: [place] Ekg Site Rite  Result Date: 12/27/2017 If [facility] image not attached, placement could not be confirmed due to current cardiac rhythm.    Assessment/Plan: S/P Procedure(s) (LRB): REDO STERNOTOMY (N/A) AORTIC TO RIGHT COMMON CAROTID  AND RIGHT SUBCLAVIAN  ARTERY  BYPASS (Right) REPAIR OF DIAPHRAGM  1. CV-ST with HR in the 110's. On Amiodarone 200 mg bid. Co ox this am 74.3. 2. Pulmonary-Post extubation hypercarbia-appreciate critical care's assistance. Chest tube with 130 of outpout last 24 hours. Hope to remove. On 4 liters of oxygen via [place]. Will wean over next few days as able. CXR this am appears stable (no pneumothorax, atelectasis and small effusion left base). Continue bronchodilators. Metaneb therapy started yesterday.  3. ABL anemia- H and H decreased to 8.9 and 28 4.Volume overload-on Lasix 20 mg IV and Metolazone 5 mg daily. 5. Supplement potassium 6. CBGs 123/103/116.  Pre op HGA1C 6.2. She likely has pre diabetes. Once oral intake improves, will stop accu checks and SS PRN.  Donielle Liston Alba PA-C 12/28/2017 7:31 AM   Patient has notable hoarseness and will need swallow evaluation prior to progressing with diet Chest x-ray appears clear and will remove posterior mediastinal drain Sinus tachycardia, increased risk for postop A. Fib.  Low-dose oral amiodarone started Keep in ICU today because of pulmonary status-PCO2 53 mmHg  patient examined and medical record reviewed,agree with above note. Tharon Aquas Trigt III 12/28/2017

## 2017-12-28 NOTE — Progress Notes (Signed)
Patient returned to room. 

## 2017-12-28 NOTE — Progress Notes (Signed)
Modified Barium Swallow Progress Note  Patient Details  Name: Emily Rocha MRN: [id number] Date of Birth: 1955/05/12  Today's Date: 12/28/2017  Modified Barium Swallow completed.  Full report located under Chart Review in the Imaging Section.  Brief recommendations include the following:  Clinical Impression  Pt presents with primarily sensory pharyngeal dysphagia characterized by delayed swallow initiation and timing of laryngeal closure with thin and nectar liquids. Nearly all trials of thin and nectar reached the pyriform sinuses before swallow initiation and were subsequently penetrated to the level of the vocal folds. Some instances of penetration into laryngeal vestibule prior to swallow trigger also observed. Pt required cues to cough in order to clear penetrates. Compensatory chin tuck maneuver attempted with thin and nectar, however it was ineffective to aid in more timely swallow initiation and airway protection. Honey thick liquids were deglutated in a much more timely manner and with no instances of airway intrusion in 4 out of 4 trials. Pt also demonstrated efficient consumption of puree and regular texture. Recommend dysphagia 3 (mechanical soft) diet, honey thick liquids, take small bites and sips and clear throat intermittently throughout meals. ST will continue to follow to provide treatment with diet safety, efficiency, and opportunities for advancement.    Swallow Evaluation Recommendations       SLP Diet Recommendations: Dysphagia 3 (Mech soft) solids;Honey thick liquids   Liquid Administration via: Cup;Spoon   Medication Administration: Crushed with puree   Supervision: Patient able to self feed;Intermittent supervision to cue for compensatory strategies   Compensations: Minimize environmental distractions;Slow rate;Small sips/bites;Clear throat intermittently   Postural Changes: Seated upright at 90 degrees;Remain semi-upright after after feeds/meals (Comment)   Oral Care Recommendations: Oral care BID   Other Recommendations: Order thickener from pharmacy    Houston Siren 12/28/2017,2:42 PM  Orbie Pyo Colvin Caroli.Ed Risk analyst [phone] Office [phone]

## 2017-12-28 NOTE — Evaluation (Signed)
Clinical/Bedside Swallow Evaluation Patient Details  Name: Emily Rocha MRN: [id number] Date of Birth: [birth year]  Today's Date: 12/28/2017 Time: SLP Start Time (ACUTE ONLY): 0950 SLP Stop Time (ACUTE ONLY): 1003 SLP Time Calculation (min) (ACUTE ONLY): 13 min  Past Medical History:  Past Medical History:  Diagnosis Date  . Asthma   . COPD (chronic obstructive pulmonary disease) ([place])   . Coronary artery disease    hx CABG  . GERD (gastroesophageal reflux disease)   . Pneumonia   . Shortness of breath dyspnea   . Stroke [facility])    "mini stroke" numbness and weakness to left side of the body   Past Surgical History:  Past Surgical History:  Procedure Laterality Date  . AORTIC ARCH ANGIOGRAPHY N/A 11/04/2017   Procedure: AORTIC ARCH ANGIOGRAPHY;  Surgeon: Elam Dutch, MD;  Location: [place] CV LAB;  Service: Cardiovascular;  Laterality: N/A;  . APPENDECTOMY    . CAROTID-SUBCLAVIAN BYPASS GRAFT Right 12/26/2017   Procedure: AORTIC TO RIGHT COMMON CAROTID AND RIGHT SUBCLAVIAN  ARTERY  BYPASS;  Surgeon: Elam Dutch, MD;  Location: [place];  Service: Vascular;  Laterality: Right;  . CHOLECYSTECTOMY    . CLOSURE OF DIAPHRAGM  12/26/2017   Procedure: REPAIR OF DIAPHRAGM;  Surgeon: Ivin Poot, MD;  Location: [place];  Service: Thoracic;;  . COLONOSCOPY N/A 10/03/2014   Procedure: COLONOSCOPY;  Surgeon: Rogene Houston, MD;  [facility];  Service: Endoscopy;  Laterality: N/A;  730  . CORONARY ARTERY BYPASS GRAFT N/A 12/14/2016   Procedure: CORONARY ARTERY BYPASS GRAFTING (CABG) x three , using left internal mammary artery and right leg greater saphenous vein harvested endoscopically;  Surgeon: Ivin Poot, MD;  Location: [place];  Service: Open Heart Surgery;  Laterality: N/A;  . ENDARTERECTOMY Right 12/06/2014   Procedure: ENDARTERECTOMY CAROTid;  Surgeon: Elam Dutch, MD;  Location: [facility];  Service: Vascular;  Laterality: Right;  .  ESOPHAGOGASTRODUODENOSCOPY N/A 12/03/2016   Procedure: ESOPHAGOGASTRODUODENOSCOPY (EGD);  Surgeon: Rogene Houston, MD;  [facility];  Service: Endoscopy;  Laterality: N/A;  7:30  . LEFT HEART CATH AND CORONARY ANGIOGRAPHY N/A 12/14/2016   Procedure: LEFT HEART CATH AND CORONARY ANGIOGRAPHY;  Surgeon: Burnell Blanks, MD;  Location: [place] [facility];  Service: Cardiovascular;  Laterality: N/A;  . PERIPHERAL VASCULAR CATHETERIZATION N/A 12/05/2014   Procedure:  Carotid Arch Angiography;  Surgeon: Conrad Abernathy, MD;  Location: [place] CV LAB;  Service: Cardiovascular;  Laterality: N/A;  . STERNOTOMY N/A 12/26/2017   Procedure: REDO STERNOTOMY;  Surgeon: Prescott Gum, Collier Salina, MD;  Location: [place];  Service: Thoracic;  Laterality: N/A;  . TEE WITHOUT CARDIOVERSION N/A 12/14/2016   Procedure: TRANSESOPHAGEAL ECHOCARDIOGRAM (TEE);  Surgeon: Prescott Gum, Collier Salina, MD;  Location: [place];  Service: Open Heart Surgery;  Laterality: N/A;   HPI:  Pt is a 62 year old female presented 12/26/17 for redo sternotomy with aortic to right common carotid and right subclavian artery bypass surgery - intubated for 1 day (11/11-11/12). PMH heavy tobacco use (quit 1 year ago), asthma, GERD, pneumonia, SOB, PVD, stroke. Pt also had CABG x3 1 year ago; was recovering well until presented with 2-3 episodes TIA in Oct 2019. CXR showed mild bibasilar atelectasis slightly increased on right comapred to last exam. Per chart, swallow eval requested due to concern for possible nerve damage from surgery.   Assessment / Plan / Recommendation Clinical Impression  Pt was alert and pleasant throughout bedside swallow  evaluation. Of note, pt presented with congested coughing at baseline; volitional cough is also congested, weak, and painful for pt (due to recent surgery). She displayed immediate cough following 3 oz water test as well as subsequent sips of thin, however no overt s/s aspiration observed with puree. Pt's  dentures were unavailable, thus regular texture not attempted. Given increased risk factors and clinical presentation concerning for aspiration with liquids, recommend continue NPO except meds and defer treatment plan until intstrumental MBS exam completed today at 1:00 PM.    SLP Visit Diagnosis: Dysphagia, unspecified (R13.10)    Aspiration Risk  Moderate aspiration risk    Diet Recommendation NPO except meds   Medication Administration: Whole meds with puree Supervision: Full supervision/cueing for compensatory strategies Postural Changes: Seated upright at 90 degrees    Other  Recommendations Oral Care Recommendations: Oral care QID   Follow up Recommendations Other (comment)(TBD)      Frequency and Duration            Prognosis Prognosis for Safe Diet Advancement: Good      Swallow Study   General HPI: Pt is a 62 year old female presented 12/26/17 for redo sternotomy with aortic to right common carotid and right subclavian artery bypass surgery - intubated for 1 day (11/11-11/12). PMH heavy tobacco use (quit 1 year ago), asthma, GERD, pneumonia, SOB, PVD, stroke. Pt also had CABG x3 1 year ago; was recovering well until presented with 2-3 episodes TIA in Oct 2019. CXR showed mild bibasilar atelectasis slightly increased on right comapred to last exam. Per chart, swallow eval requested due to concern for possible nerve damage from surgery. Type of Study: Bedside Swallow Evaluation Previous Swallow Assessment: none found in chart Diet Prior to this Study: Thin liquids(full liquids) Temperature Spikes Noted: No Respiratory Status: Nasal cannula History of Recent Intubation: Yes Length of Intubations (days): 1 days Date extubated: 12/27/17 Behavior/Cognition: Alert;Cooperative;Pleasant mood Oral Cavity Assessment: Within Functional Limits Oral Care Completed by SLP: No Oral Cavity - Dentition: Dentures, top;Dentures, not available;Dentures, bottom Vision: Functional for  self-feeding Self-Feeding Abilities: Able to feed self Patient Positioning: Upright in bed Baseline Vocal Quality: Hoarse Volitional Cough: Congested;Other (Comment)(painful d/t recent surgery) Volitional Swallow: Able to elicit    Oral/Motor/Sensory Function Overall Oral Motor/Sensory Function: Within functional limits   Ice Chips Ice chips: Not tested   Thin Liquid Thin Liquid: Impaired Presentation: Cup;Straw Oral Phase Impairments: (none) Oral Phase Functional Implications: (none) Pharyngeal  Phase Impairments: Cough - Immediate    Nectar Thick Nectar Thick Liquid: Not tested   Honey Thick Honey Thick Liquid: Not tested   Puree Puree: Within functional limits Presentation: Self Fed;Spoon   Solid     Solid: Not tested     Jettie Booze, Student SLP  Jettie Booze 12/28/2017,10:30 AM

## 2017-12-29 ENCOUNTER — Other Ambulatory Visit: Payer: Self-pay

## 2017-12-29 ENCOUNTER — Inpatient Hospital Stay (HOSPITAL_COMMUNITY): Payer: BLUE CROSS/BLUE SHIELD

## 2017-12-29 LAB — COMPREHENSIVE METABOLIC PANEL
ALT: 36 U/L (ref 0–44)
AST: 33 U/L (ref 15–41)
Albumin: 2.8 g/dL — ABNORMAL LOW (ref 3.5–5.0)
Alkaline Phosphatase: 91 U/L (ref 38–126)
Anion gap: 8 (ref 5–15)
BUN: 11 mg/dL (ref 8–23)
CO2: 29 mmol/L (ref 22–32)
Calcium: 8.8 mg/dL — ABNORMAL LOW (ref 8.9–10.3)
Chloride: 97 mmol/L — ABNORMAL LOW (ref 98–111)
Creatinine, Ser: 0.69 mg/dL (ref 0.44–1.00)
GFR calc Af Amer: >60 mL/min (ref >60)
GFR calc non Af Amer: >60 mL/min (ref >60)
Glucose, Bld: 137 mg/dL — ABNORMAL HIGH (ref 70–99)
Potassium: 4 mmol/L (ref 3.5–5.1)
Sodium: 134 mmol/L — ABNORMAL LOW (ref 135–145)
Total Bilirubin: 1.1 mg/dL (ref 0.3–1.2)
Total Protein: 6 g/dL — ABNORMAL LOW (ref 6.5–8.1)

## 2017-12-29 LAB — CBC WITH DIFFERENTIAL/PLATELET
BASOS PCT: 0 %
Basophils Absolute: 0 10*3/uL (ref 0.0–0.1)
Eosinophils Absolute: 0.2 10*3/uL (ref 0.0–0.5)
Eosinophils Relative: 1 %
HCT: 28.2 % — ABNORMAL LOW (ref 36.0–46.0)
Hemoglobin: 8.9 g/dL — ABNORMAL LOW (ref 12.0–15.0)
LYMPHS PCT: 4 %
Lymphs Abs: 0.6 10*3/uL — ABNORMAL LOW (ref 0.7–4.0)
MCH: 28.8 pg (ref 26.0–34.0)
MCHC: 31.6 g/dL (ref 30.0–36.0)
MCV: 91.3 fL (ref 80.0–100.0)
Monocytes Absolute: 0.5 10*3/uL (ref 0.1–1.0)
Monocytes Relative: 3 %
NEUTROS ABS: 14.3 10*3/uL — AB (ref 1.7–7.7)
NEUTROS PCT: 92 %
NRBC: 0 /100{WBCs}
PLATELETS: 206 10*3/uL (ref 150–400)
RBC: 3.09 MIL/uL — AB (ref 3.87–5.11)
RDW: 13.4 % (ref 11.5–15.5)
WBC: 15.5 10*3/uL — AB (ref 4.0–10.5)
nRBC: 0 % (ref 0.0–0.2)

## 2017-12-29 LAB — GLUCOSE, CAPILLARY
Glucose-Capillary: 115 mg/dL — ABNORMAL HIGH (ref 70–99)
Glucose-Capillary: 190 mg/dL — ABNORMAL HIGH (ref 70–99)
Glucose-Capillary: 184 mg/dL — ABNORMAL HIGH (ref 70–99)
Glucose-Capillary: 159 mg/dL — ABNORMAL HIGH (ref 70–99)

## 2017-12-29 LAB — MAGNESIUM: MAGNESIUM: 2 mg/dL (ref 1.7–2.4)

## 2017-12-29 LAB — PHOSPHORUS: PHOSPHORUS: 2 mg/dL — AB (ref 2.5–4.6)

## 2017-12-29 MED ORDER — SODIUM CHLORIDE 0.9 % IV SOLN
750.0000 mg | Freq: Two times a day (BID) | INTRAVENOUS | Status: AC
  Administered 2017-12-29 – 2018-01-01 (×8): 750 mg via INTRAVENOUS
  Filled 2017-12-29 (×8): qty 750

## 2017-12-29 MED ORDER — INSULIN ASPART 100 UNIT/ML ~~LOC~~ SOLN: 0.0000 [IU] | Freq: Every day | SUBCUTANEOUS | Status: DC

## 2017-12-29 MED ORDER — INSULIN ASPART 100 UNIT/ML ~~LOC~~ SOLN: 0.0000 [IU] | Freq: Three times a day (TID) | SUBCUTANEOUS | Status: DC

## 2017-12-29 MED ORDER — METOPROLOL TARTRATE 12.5 MG HALF TABLET
12.5000 mg | ORAL_TABLET | Freq: Two times a day (BID) | ORAL | Status: DC
  Administered 2017-12-29 – 2018-01-03 (×12): 12.5 mg via ORAL
  Filled 2017-12-29 (×13): qty 1

## 2017-12-29 NOTE — Progress Notes (Signed)
[facility]  Subjective  - feels a little better   Objective 119/61 (!) 115 98.3 F (36.8 C) (Oral) 20 95%  Intake/Output Summary (Last 24 hours) at 12/29/2017 0743 Last data filed at 12/29/2017 0600 Gross per 24 hour  Intake 239.09 ml  Output 1955 ml  Net -1715.91 ml   Incision clean About 200 cc of brown turbid fluid draining from chest tube site Neuro UE/LE 5/5 motor  Assessment/Planning: S/p aorto carotid subclavian bypass Neuro intact Now with drainage from chest tube site sternotomy incision so far ok, watchful waiting. WBC from today still pending Tachycardia persistant, afebrile Antibiotic decision per Prescott Gum Continue to mobilize Will culture fluid from drainage  [name] 12/29/2017 7:43 AM --  Laboratory Lab Results: Recent Labs    12/28/17 0425 12/29/17 0606  WBC 18.2* PENDING  HGB 8.9* 8.9*  HCT 28.0* 28.2*  PLT 170 206   BMET Recent Labs    12/28/17 0425 12/29/17 0606  NA 132* 134*  K 3.3* 4.0  CL 97* 97*  CO2 26 29  GLUCOSE 178* 137*  BUN 12 11  CREATININE 0.79 0.69  CALCIUM 8.2* 8.8*    COAG Lab Results  Component Value Date   INR 1.00 12/22/2017   INR 1.40 12/14/2016   INR 1.01 12/12/2016   No results found for: PTT

## 2017-12-29 NOTE — Progress Notes (Signed)
  Speech Language Pathology Treatment: Dysphagia  Patient Details Name: Emily Rocha MRN: [id number] DOB: August 06, 1955 Today's Date: 12/29/2017 Time: 4827-0786 SLP Time Calculation (min) (ACUTE ONLY): 17 min  Assessment / Plan / Recommendation Clinical Impression  Pt was alert and attentive throughout dysphagia treatment session today. She reported compliance with thickened liquid recommendations however decreased enjoyment due to taste and texture. Of note, pt is also experiencing decreased overall appetite. SLP reviewed/explained results of yesterday's swallow test using videos from [facility] with pt; she verbalized understanding of improved swallow function with honey thick as compared to thin (as shown on MBSS video). SLP also emphasized use of thickener is expected to be short-term while pt is medically fragile and still at high risk for aspiration and respiratory compromise. Given min verbal cues, pt demonstrated use of recommended strategies (small bites/sips, slow rate, clear throat intermittently) while consuming honey thick and upgraded regular texture (graham cracker). She exhibited 2 instances of immediate reflexive cough (still noted to be weak and congested) following honey thick liquids. Recommend continue dysphagia 3 (mechanical soft), honey thick liquids, take small bites and sips in a minimally distracting environment, assume upright position and clear throat intermittently while eating/drinking. ST will continue to follow to provide treatment with diet safety, efficiency, and dermination of appropriateness for repeat instrumental exam. Pt has good prognosis to advance liquids with time and improvment of overall medical status.    HPI HPI: Pt is a 62 year old female presented 12/26/17 for redo sternotomy with aortic to right common carotid and right subclavian artery bypass surgery - intubated for 1 day (11/11-11/12). PMH heavy tobacco use (quit 1 year ago), asthma, GERD, pneumonia, SOB, PVD,  stroke. Pt also had CABG x3 1 year ago; was recovering well until presented with 2-3 episodes TIA in Oct 2019. CXR showed mild bibasilar atelectasis slightly increased on right comapred to last exam. Per chart, swallow eval requested due to concern for possible nerve damage from surgery.      SLP Plan  Continue with current plan of care       Recommendations  Diet recommendations: Dysphagia 3 (mechanical soft);Honey-thick liquid Liquids provided via: Cup Medication Administration: Whole meds with puree Supervision: Patient able to self feed;Intermittent supervision to cue for compensatory strategies Compensations: Minimize environmental distractions;Slow rate;Small sips/bites;Clear throat intermittently Postural Changes and/or Swallow Maneuvers: Seated upright 90 degrees                Oral Care Recommendations: Oral care BID Follow up Recommendations: Skilled Nursing facility SLP Visit Diagnosis: Dysphagia, unspecified (R13.10) Plan: Continue with current plan of care       Jettie Booze, Student SLP                Jettie Booze 12/29/2017, 12:36 PM

## 2017-12-29 NOTE — Plan of Care (Signed)
  Problem: Activity: Goal: Risk for activity intolerance will decrease Outcome: Progressing   

## 2017-12-29 NOTE — Progress Notes (Signed)
Placed patient on BIPAP 10/5 with oxygen set at 2lpm.

## 2017-12-29 NOTE — Progress Notes (Addendum)
TCTS DAILY ICU PROGRESS NOTE                   [address]            [place] [address]          [phone]   3 Days Post-Op Procedure(s) (LRB): REDO STERNOTOMY (N/A) AORTIC TO RIGHT COMMON CAROTID AND RIGHT SUBCLAVIAN  ARTERY  BYPASS (Right) REPAIR OF DIAPHRAGM  Total Length of Stay:  LOS: 3 days   Subjective: Patient still with hoarseness. She also has intermittent nausea.  Objective: Vital signs in last 24 hours: Temp:  [98.4 F (36.9 C)-99 F (37.2 C)] 98.5 F (36.9 C) (11/14 0349) Pulse Rate:  [103-123] 115 (11/14 0700) Cardiac Rhythm: Sinus tachycardia (11/14 0400) Resp:  [11-28] 20 (11/14 0700) BP: (84-127)/(57-91) 119/61 (11/14 0700) SpO2:  [87 %-99 %] 95 % (11/14 0700)  Filed Weights   12/26/17 0656 12/27/17 0500 12/28/17 0545  Weight: 83.9 kg 91.9 kg 89.7 kg    Weight change:        Intake/Output from previous day: 11/13 0701 - 11/14 0700 In: 239.1 [P.O.:100; I.V.:39; IV Piggyback:100.1] Out: 1955 [Urine:1885; Chest Tube:70]  Intake/Output this shift: No intake/output data recorded.  Current Meds: Scheduled Meds: . acetaminophen  1,000 mg Oral Q6H   Or  . acetaminophen (TYLENOL) oral liquid 160 mg/5 mL  1,000 mg Oral Q6H  . amiodarone  200 mg Oral BID  . atorvastatin  40 mg Oral Daily  . bisacodyl  10 mg Oral Daily  . Chlorhexidine Gluconate Cloth  6 each Topical Daily  . famotidine  20 mg Oral BID  . furosemide  20 mg Intravenous BID  . guaiFENesin  600 mg Oral BID  . insulin aspart  0-24 Units Subcutaneous Q4H  . levalbuterol  0.63 mg Nebulization Q6H WA  . lidocaine  1 patch Transdermal Q24H  . mouth rinse  15 mL Mouth Rinse BID  . metoCLOPramide (REGLAN) injection  10 mg Intravenous Q6H  . metolazone  5 mg Oral Daily  . mometasone-formoterol  2 puff Inhalation BID  . potassium chloride  20 mEq Oral BID  . senna-docusate  1 tablet Oral QHS  . sodium chloride flush  10-40 mL Intracatheter Q12H  . umeclidinium  bromide  1 puff Inhalation Daily   Continuous Infusions: . albumin human    . dextrose 5 % and 0.45% NaCl Stopped (12/28/17 0858)  . phenylephrine (NEO-SYNEPHRINE) Adult infusion Stopped (12/27/17 1745)  . potassium chloride     PRN Meds:.docusate, fentaNYL (SUBLIMAZE) injection, levalbuterol, ondansetron (ZOFRAN) IV, oxyCODONE, potassium chloride, RESOURCE THICKENUP CLEAR, sodium chloride flush, traMADol   Neurologic: intact Heart: RRR Lungs: Slightly diminshed at bases Abdomen: Soft, non tender, sporadic bowel sounds Extremities: SCDs in place Wound: Aquacel intact  Lab Results: CBC: Recent Labs    12/27/17 0206 12/28/17 0425  WBC 17.9* 18.2*  HGB 10.1* 8.9*  HCT 30.4* 28.0*  PLT 234 170   BMET:  Recent Labs    12/28/17 0425 12/29/17 0606  NA 132* 134*  K 3.3* 4.0  CL 97* 97*  CO2 26 29  GLUCOSE 178* 137*  BUN 12 11  CREATININE 0.79 0.69  CALCIUM 8.2* 8.8*    CMET: Lab Results  Component Value Date   WBC 18.2 (H) 12/28/2017   HGB 8.9 (L) 12/28/2017   HCT 28.0 (L) 12/28/2017   PLT 170 12/28/2017   GLUCOSE 137 (H) 12/29/2017   CHOL 205 (H) 12/13/2016  TRIG 101 12/13/2016   HDL 68 12/13/2016   LDLCALC 117 (H) 12/13/2016   ALT 36 12/29/2017   AST 33 12/29/2017   NA 134 (L) 12/29/2017   K 4.0 12/29/2017   CL 97 (L) 12/29/2017   CREATININE 0.69 12/29/2017   BUN 11 12/29/2017   CO2 29 12/29/2017   INR 1.00 12/22/2017   HGBA1C 6.2 (H) 12/22/2017      PT/INR: No results for input(s): LABPROT, INR in the last 72 hours. Radiology: Dg Swallowing Func-speech Pathology  Result Date: 12/28/2017 Objective Swallowing Evaluation: Type of Study: MBS-Modified Barium Swallow Study  Patient Details Name: Emily Rocha MRN: [id number] Date of Birth: 06-04-1955 Today's Date: 12/28/2017 Time: SLP Start Time (ACUTE ONLY): 1309 -SLP Stop Time (ACUTE ONLY): 1330 SLP Time Calculation (min) (ACUTE ONLY): 21 min Past Medical History: Past Medical History: Diagnosis Date .  Asthma  . COPD (chronic obstructive pulmonary disease) ([place])  . Coronary artery disease   hx CABG . GERD (gastroesophageal reflux disease)  . Pneumonia  . Shortness of breath dyspnea  . Stroke [facility])   "mini stroke" numbness and weakness to left side of the body Past Surgical History: Past Surgical History: Procedure Laterality Date . AORTIC ARCH ANGIOGRAPHY N/A 11/04/2017  Procedure: AORTIC ARCH ANGIOGRAPHY;  Surgeon: Elam Dutch, MD;  Location: [place] [facility];  Service: Cardiovascular;  Laterality: N/A; . APPENDECTOMY   . CAROTID-SUBCLAVIAN BYPASS GRAFT Right 12/26/2017  Procedure: AORTIC TO RIGHT COMMON CAROTID AND RIGHT SUBCLAVIAN  ARTERY  BYPASS;  Surgeon: Elam Dutch, MD;  Location: [place];  Service: Vascular;  Laterality: Right; . CHOLECYSTECTOMY   . CLOSURE OF DIAPHRAGM  12/26/2017  Procedure: REPAIR OF DIAPHRAGM;  Surgeon: Ivin Poot, MD;  Location: [place];  Service: Thoracic;; . COLONOSCOPY N/A 10/03/2014  Procedure: COLONOSCOPY;  Surgeon: Rogene Houston, MD;  [facility];  Service: Endoscopy;  Laterality: N/A;  730 . CORONARY ARTERY BYPASS GRAFT N/A 12/14/2016  Procedure: CORONARY ARTERY BYPASS GRAFTING (CABG) x three , using left internal mammary artery and right leg greater saphenous vein harvested endoscopically;  Surgeon: Ivin Poot, MD;  Location: [place];  Service: Open Heart Surgery;  Laterality: N/A; . ENDARTERECTOMY Right 12/06/2014  Procedure: ENDARTERECTOMY CAROTid;  Surgeon: Elam Dutch, MD;  [facility];  Service: Vascular;  Laterality: Right; . ESOPHAGOGASTRODUODENOSCOPY N/A 12/03/2016  Procedure: ESOPHAGOGASTRODUODENOSCOPY (EGD);  Surgeon: Rogene Houston, MD;  [facility];  Service: Endoscopy;  Laterality: N/A;  7:30 . LEFT HEART CATH AND CORONARY ANGIOGRAPHY N/A 12/14/2016  Procedure: LEFT HEART CATH AND CORONARY ANGIOGRAPHY;  Surgeon: Burnell Blanks, MD;  Location: [place] [facility];  Service: Cardiovascular;  Laterality:  N/A; . PERIPHERAL VASCULAR CATHETERIZATION N/A 12/05/2014  Procedure:  Carotid Arch Angiography;  Surgeon: Conrad Tallulah, MD;  Location: [place] [facility];  Service: Cardiovascular;  Laterality: N/A; . STERNOTOMY N/A 12/26/2017  Procedure: REDO STERNOTOMY;  Surgeon: Prescott Gum, Collier Salina, MD;  Location: [place];  Service: Thoracic;  Laterality: N/A; . TEE WITHOUT CARDIOVERSION N/A 12/14/2016  Procedure: TRANSESOPHAGEAL ECHOCARDIOGRAM (TEE);  Surgeon: Prescott Gum, Collier Salina, MD;  Location: [place];  Service: Open Heart Surgery;  Laterality: N/A; HPI: Pt is a 62 year old female presented 12/26/17 for redo sternotomy with aortic to right common carotid and right subclavian artery bypass surgery - intubated for 1 day (11/11-11/12). PMH heavy tobacco use (quit 1 year ago), asthma, GERD, pneumonia, SOB, PVD, stroke. Pt also had CABG x3 1 year ago;  was recovering well until presented with 2-3 episodes TIA in Oct 2019. CXR showed mild bibasilar atelectasis slightly increased on right comapred to last exam. Per chart, swallow eval requested due to concern for possible nerve damage from surgery.  No data recorded Assessment / Plan / Recommendation CHL IP CLINICAL IMPRESSIONS 12/28/2017 Clinical Impression Pt presents with primarily sensory pharyngeal dysphagia characterized by delayed swallow initiation and timing of laryngeal closure with thin and nectar liquids. Nearly all trials of thin and nectar reached the pyriform sinuses before swallow initiation and were subsequently penetrated to the level of the vocal folds. Some instances of penetration into laryngeal vestibule prior to swallow trigger also observed. Pt required cues to cough in order to clear penetrates. Compensatory chin tuck maneuver attempted with thin and nectar, however it was ineffective to aid in more timely swallow initiation and airway protection. Honey thick liquids were deglutated in a much more timely manner and with no instances of airway intrusion in 4 out of 4  trials. Pt also demonstrated efficient consumption of puree and regular texture. Recommend dysphagia 3 (mechanical soft) diet, honey thick liquids, take small bites and sips and clear throat intermittently throughout meals. ST will continue to follow to provide treatment with diet safety, efficiency, and opportunities for advancement.  SLP Visit Diagnosis Dysphagia, pharyngeal phase (R13.13) Attention and concentration deficit following -- Frontal lobe and executive function deficit following -- Impact on safety and function Moderate aspiration risk   CHL IP TREATMENT RECOMMENDATION 12/28/2017 Treatment Recommendations Therapy as outlined in treatment plan below   Prognosis 12/28/2017 Prognosis for Safe Diet Advancement Good Barriers to Reach Goals -- Barriers/Prognosis Comment -- CHL IP DIET RECOMMENDATION 12/28/2017 SLP Diet Recommendations Dysphagia 3 (Mech soft) solids;Honey thick liquids Liquid Administration via Cup;Spoon Medication Administration Crushed with puree Compensations Minimize environmental distractions;Slow rate;Small sips/bites;Clear throat intermittently Postural Changes Seated upright at 90 degrees;Remain semi-upright after after feeds/meals (Comment)   CHL IP OTHER RECOMMENDATIONS 12/28/2017 Recommended Consults -- Oral Care Recommendations Oral care BID Other Recommendations Order thickener from pharmacy   CHL IP FOLLOW UP RECOMMENDATIONS 12/28/2017 Follow up Recommendations Skilled Nursing facility   [facility] IP FREQUENCY AND DURATION 12/28/2017 Speech Therapy Frequency (ACUTE ONLY) min 2x/week Treatment Duration 2 weeks      CHL IP ORAL PHASE 12/28/2017 Oral Phase Impaired Oral - Pudding Teaspoon -- Oral - Pudding Cup -- Oral - Honey Teaspoon -- Oral - Honey Cup Lingual pumping Oral - Nectar Teaspoon -- Oral - Nectar Cup WFL Oral - Nectar Straw -- Oral - Thin Teaspoon -- Oral - Thin Cup -- Oral - Thin Straw -- Oral - Puree WFL Oral - Mech Soft -- Oral - Regular WFL Oral - Multi-Consistency --  Oral - Pill -- Oral Phase - Comment --  CHL IP PHARYNGEAL PHASE 12/28/2017 Pharyngeal Phase Impaired Pharyngeal- Pudding Teaspoon -- Pharyngeal -- Pharyngeal- Pudding Cup -- Pharyngeal -- Pharyngeal- Honey Teaspoon -- Pharyngeal -- Pharyngeal- Honey Cup WFL Pharyngeal -- Pharyngeal- Nectar Teaspoon -- Pharyngeal -- Pharyngeal- Nectar Cup Delayed swallow initiation-pyriform sinuses;Penetration/Aspiration during swallow Pharyngeal Material enters airway, CONTACTS cords and not ejected out Pharyngeal- Nectar Straw -- Pharyngeal -- Pharyngeal- Thin Teaspoon -- Pharyngeal -- Pharyngeal- Thin Cup Delayed swallow initiation-pyriform sinuses;Penetration/Aspiration during swallow Pharyngeal Material enters airway, CONTACTS cords and not ejected out Pharyngeal- Thin Straw -- Pharyngeal -- Pharyngeal- Puree WFL Pharyngeal -- Pharyngeal- Mechanical Soft -- Pharyngeal -- Pharyngeal- Regular WFL Pharyngeal -- Pharyngeal- Multi-consistency -- Pharyngeal -- Pharyngeal- Pill -- Pharyngeal -- Pharyngeal Comment --  CHL IP  CERVICAL ESOPHAGEAL PHASE 12/28/2017 Cervical Esophageal Phase WFL Pudding Teaspoon -- Pudding Cup -- Honey Teaspoon -- Honey Cup -- Nectar Teaspoon -- Nectar Cup -- Nectar Straw -- Thin Teaspoon -- Thin Cup -- Thin Straw -- Puree -- Mechanical Soft -- Regular -- Multi-consistency -- Pill -- Cervical Esophageal Comment -- [place] Siren 12/28/2017, 2:42 PM    Orbie Pyo Colvin Caroli.Ed Risk analyst [phone] Office [phone]             Assessment/Plan: S/P Procedure(s) (LRB): REDO STERNOTOMY (N/A) AORTIC TO RIGHT COMMON CAROTID AND RIGHT SUBCLAVIAN  ARTERY  BYPASS (Right) REPAIR OF DIAPHRAGM  1. CV-ST with HR in the 110's. On Amiodarone 200 mg bid. Will start BB as BP improved 2. Pulmonary-Post extubation hypercarbia-appreciate critical care's assistance.  On 2 liters of oxygen via [employer]. Will wean over next few days as able. CXR this am appears stable (no pneumothorax,  bibasilar atelectasis and small effusion left base). Continue bronchodilators. Metaneb therapy started yesterday.  3. ABL anemia- H and H yesterday 8.9 and 28 4.Volume overload-on Lasix 20 mg IV and Metolazone 5 mg daily. 5. Supplement potassium 6. CBGs 116/113/115.  Pre op HGA1C 6.2. She likely has pre diabetes. Will stop accu checks and SS PRN. 7.GI-per speech path, dysphagia 3 (mech soft) diet. Patient still with hoarseness-etiology secondary to ET tube vs recurrent laryngeal nerve. Regarding nausea, on Reglan and Zofran PRN. Is on oral Amiodarone so will need to monitor  Donielle Liston Alba PA-C 12/29/2017 7:25 AM   Fat necrosis draining from chest tube site- few WBC, no bacteria Pulmonary status still marginal Keep in ICU Cont iv antibiotics patient examined and medical record reviewed,agree with above note. Tharon Aquas Trigt III 12/30/2017

## 2017-12-29 NOTE — Plan of Care (Signed)
  Problem: Education: Goal: Will demonstrate proper wound care and an understanding of methods to prevent future damage Outcome: Progressing Goal: Knowledge of disease or condition will improve Outcome: Progressing Goal: Knowledge of the prescribed therapeutic regimen will improve Outcome: Progressing Goal: Individualized Educational Video(s) Outcome: Progressing   Problem: Activity: Goal: Risk for activity intolerance will decrease Outcome: Progressing   Problem: Cardiac: Goal: Will achieve and/or maintain hemodynamic stability Outcome: Progressing   Problem: Clinical Measurements: Goal: Postoperative complications will be avoided or minimized Outcome: Progressing   Problem: Respiratory: Goal: Respiratory status will improve Outcome: Progressing   Problem: Skin Integrity: Goal: Wound healing without signs and symptoms of infection Outcome: Progressing Goal: Risk for impaired skin integrity will decrease Outcome: Progressing   Problem: Urinary Elimination: Goal: Ability to achieve and maintain adequate renal perfusion and functioning will improve Outcome: Progressing   

## 2017-12-30 ENCOUNTER — Inpatient Hospital Stay (HOSPITAL_COMMUNITY): Payer: BLUE CROSS/BLUE SHIELD

## 2017-12-30 LAB — TYPE AND SCREEN
ABO/RH(D): A NEG
Antibody Screen: NEGATIVE
Unit division: 0
Unit division: 0
Unit division: 0
Unit division: 0
Unit division: 0
Unit division: 0

## 2017-12-30 LAB — BPAM RBC
Blood Product Expiration Date: 201911302359
Blood Product Expiration Date: 201912012359
Blood Product Expiration Date: 201912102359
Blood Product Expiration Date: 201912102359
Blood Product Expiration Date: 201912102359
Blood Product Expiration Date: 201912102359
ISSUE DATE / TIME: 201911110827
ISSUE DATE / TIME: 201911110827
ISSUE DATE / TIME: 201911111059
ISSUE DATE / TIME: 201911111059
Unit Type and Rh: 600
Unit Type and Rh: 600
Unit Type and Rh: 600
Unit Type and Rh: 600
Unit Type and Rh: 600
Unit Type and Rh: 600

## 2017-12-30 LAB — COMPREHENSIVE METABOLIC PANEL
ALT: 30 U/L (ref 0–44)
AST: 24 U/L (ref 15–41)
Albumin: 2.6 g/dL — ABNORMAL LOW (ref 3.5–5.0)
Alkaline Phosphatase: 86 U/L (ref 38–126)
Anion gap: 8 (ref 5–15)
BUN: 6 mg/dL — ABNORMAL LOW (ref 8–23)
CO2: 36 mmol/L — ABNORMAL HIGH (ref 22–32)
Calcium: 8.7 mg/dL — ABNORMAL LOW (ref 8.9–10.3)
Chloride: 93 mmol/L — ABNORMAL LOW (ref 98–111)
Creatinine, Ser: 0.67 mg/dL (ref 0.44–1.00)
GFR calc Af Amer: >60 mL/min (ref >60)
GFR calc non Af Amer: >60 mL/min (ref >60)
Glucose, Bld: 119 mg/dL — ABNORMAL HIGH (ref 70–99)
Potassium: 3.5 mmol/L (ref 3.5–5.1)
Sodium: 137 mmol/L (ref 135–145)
Total Bilirubin: 1 mg/dL (ref 0.3–1.2)
Total Protein: 5.7 g/dL — ABNORMAL LOW (ref 6.5–8.1)

## 2017-12-30 LAB — CBC
HCT: 26.8 % — ABNORMAL LOW (ref 36.0–46.0)
Hemoglobin: 8.4 g/dL — ABNORMAL LOW (ref 12.0–15.0)
MCH: 28.7 pg (ref 26.0–34.0)
MCHC: 31.3 g/dL (ref 30.0–36.0)
MCV: 91.5 fL (ref 80.0–100.0)
Platelets: 225 10*3/uL (ref 150–400)
RBC: 2.93 MIL/uL — ABNORMAL LOW (ref 3.87–5.11)
RDW: 13.3 % (ref 11.5–15.5)
WBC: 7.6 10*3/uL (ref 4.0–10.5)
nRBC: 0 % (ref 0.0–0.2)

## 2017-12-30 LAB — GLUCOSE, CAPILLARY
Glucose-Capillary: 171 mg/dL — ABNORMAL HIGH (ref 70–99)
Glucose-Capillary: 143 mg/dL — ABNORMAL HIGH (ref 70–99)
Glucose-Capillary: 142 mg/dL — ABNORMAL HIGH (ref 70–99)
Glucose-Capillary: 120 mg/dL — ABNORMAL HIGH (ref 70–99)
Glucose-Capillary: 116 mg/dL — ABNORMAL HIGH (ref 70–99)
Glucose-Capillary: 103 mg/dL — ABNORMAL HIGH (ref 70–99)

## 2017-12-30 MED ORDER — POTASSIUM CHLORIDE CRYS ER 20 MEQ PO TBCR
20.0000 meq | EXTENDED_RELEASE_TABLET | ORAL | Status: AC | PRN
  Administered 2017-12-30 – 2018-01-07 (×3): 20 meq via ORAL
  Filled 2017-12-30 (×2): qty 1

## 2017-12-30 MED ORDER — POTASSIUM CHLORIDE CRYS ER 20 MEQ PO TBCR
20.0000 meq | EXTENDED_RELEASE_TABLET | ORAL | Status: AC
  Administered 2017-12-30: 20 meq via ORAL
  Filled 2017-12-30: qty 1

## 2017-12-30 NOTE — Progress Notes (Addendum)
TCTS DAILY ICU PROGRESS NOTE                   [address]            [place] [address]          [phone]   4 Days Post-Op Procedure(s) (LRB): REDO STERNOTOMY (N/A) AORTIC TO RIGHT COMMON CAROTID AND RIGHT SUBCLAVIAN  ARTERY  BYPASS (Right) REPAIR OF DIAPHRAGM  Total Length of Stay:  LOS: 4 days   Subjective: Patient still with hoarseness. She also has had cough-sometimes productive. She denies further nausea this am.  Objective: Vital signs in last 24 hours: Temp:  [98.2 F (36.8 C)-99.2 F (37.3 C)] 99.2 F (37.3 C) (11/15 0400) Pulse Rate:  [95-115] 105 (11/15 0700) Cardiac Rhythm: Sinus tachycardia (11/15 0600) Resp:  [11-29] 15 (11/14 2325) BP: (76-133)/(49-85) 107/63 (11/15 0700) SpO2:  [88 %-100 %] 90 % (11/15 0700) Weight:  [87.8 kg] 87.8 kg (11/15 0400)  Filed Weights   12/27/17 0500 12/28/17 0545 12/30/17 0400  Weight: 91.9 kg 89.7 kg 87.8 kg    Weight change:      Intake/Output from previous day: 11/14 0701 - 11/15 0700 In: 605.2 [P.O.:360; IV Piggyback:245.2] Out: 2285 [Urine:2285]  Intake/Output this shift: Total I/O In: -  Out: 50 [Urine:50]  Current Meds: Scheduled Meds: . acetaminophen  1,000 mg Oral Q6H   Or  . acetaminophen (TYLENOL) oral liquid 160 mg/5 mL  1,000 mg Oral Q6H  . atorvastatin  40 mg Oral Daily  . bisacodyl  10 mg Oral Daily  . Chlorhexidine Gluconate Cloth  6 each Topical Daily  . famotidine  20 mg Oral BID  . furosemide  20 mg Intravenous BID  . guaiFENesin  600 mg Oral BID  . insulin aspart  0-15 Units Subcutaneous TID WC  . insulin aspart  0-5 Units Subcutaneous QHS  . levalbuterol  0.63 mg Nebulization Q6H WA  . lidocaine  1 patch Transdermal Q24H  . mouth rinse  15 mL Mouth Rinse BID  . metoCLOPramide (REGLAN) injection  10 mg Intravenous Q6H  . metolazone  5 mg Oral Daily  . metoprolol tartrate  12.5 mg Oral BID  . mometasone-formoterol  2 puff Inhalation BID  . potassium chloride  20  mEq Oral BID  . potassium chloride  20 mEq Oral Q4H  . senna-docusate  1 tablet Oral QHS  . sodium chloride flush  10-40 mL Intracatheter Q12H  . umeclidinium bromide  1 puff Inhalation Daily   Continuous Infusions: . albumin human    . cefUROXime (ZINACEF)  IV 750 mg (12/29/17 2222)  . dextrose 5 % and 0.45% NaCl Stopped (12/28/17 0858)  . phenylephrine (NEO-SYNEPHRINE) Adult infusion Stopped (12/27/17 1745)  . potassium chloride     PRN Meds:.docusate, fentaNYL (SUBLIMAZE) injection, levalbuterol, ondansetron (ZOFRAN) IV, oxyCODONE, potassium chloride, potassium chloride, RESOURCE THICKENUP CLEAR, sodium chloride flush, traMADol   Neurologic: intact Heart: Slightly tachycardic Lungs: Coarse this am Abdomen: Soft, non tender, sporadic bowel sounds Extremities: SCDs in place Wound: Sternal wound is clean and dry. Chest tube site with copious sero sanguinous drainage  Lab Results: CBC: Recent Labs    12/29/17 0606 12/30/17 0410  WBC 15.5* 7.6  HGB 8.9* 8.4*  HCT 28.2* 26.8*  PLT 206 225   BMET:  Recent Labs    12/29/17 0606 12/30/17 0410  NA 134* 137  K 4.0 3.5  CL 97* 93*  CO2 29 36*  GLUCOSE 137* 119*  BUN 11 6*  CREATININE 0.69 0.67  CALCIUM 8.8* 8.7*    CMET: Lab Results  Component Value Date   WBC 7.6 12/30/2017   HGB 8.4 (L) 12/30/2017   HCT 26.8 (L) 12/30/2017   PLT 225 12/30/2017   GLUCOSE 119 (H) 12/30/2017   CHOL 205 (H) 12/13/2016   TRIG 101 12/13/2016   HDL 68 12/13/2016   LDLCALC 117 (H) 12/13/2016   ALT 30 12/30/2017   AST 24 12/30/2017   NA 137 12/30/2017   K 3.5 12/30/2017   CL 93 (L) 12/30/2017   CREATININE 0.67 12/30/2017   BUN 6 (L) 12/30/2017   CO2 36 (H) 12/30/2017   INR 1.00 12/22/2017   HGBA1C 6.2 (H) 12/22/2017      PT/INR: No results for input(s): LABPROT, INR in the last 72 hours. Radiology: No results found.   Assessment/Plan: S/P Procedure(s) (LRB): REDO STERNOTOMY (N/A) AORTIC TO RIGHT COMMON CAROTID AND  RIGHT SUBCLAVIAN  ARTERY  BYPASS (Right) REPAIR OF DIAPHRAGM  1. CV-Mostly ST with HR in the 110's. On Lopressor 12.5 mg bid. Would like to increase but BP labile. 2. Pulmonary-Post extubation hypercarbia-appreciate critical care's assistance.  On 2 liters of oxygen via [place]. Will wean over next few days as able. CXR this am appears stable (no pneumothorax, bibasilar atelectasis and small effusion left base). Continue bronchodilators and Metaneb therapy  3. ABL anemia- H and H yesterday 8.4 and 26.8 4.Volume overload-on Lasix 20 mg IV and Metolazone 5 mg daily. 5. Supplement potassium 6. CBGs 159/143/142.  Pre op HGA1C 6.2. She likely has pre diabetes. Will stop accu checks and SS PRN. 7.GI-per speech path, dysphagia 3 (mech soft) diet. Patient still with hoarseness-etiology secondary to ET tube vs recurrent laryngeal nerve.    Donielle Liston Alba PA-C 12/30/2017 7:25 AM  Patient draining fat necrosis mucoid material from chest tube site Sternal incision clean and dry Chest x-ray remains clear of the patient has a wet cough and hoarseness  Continue dysphagia 3 diet with thickened liquids Wean oxygen as tolerated.  CCM has signed off.  They recommend formal sleep study after patient has recovered from redo sternotomy Continue empiric antibiotics until cultures of chest tube drainage are finalized.  Patient has a large amount of prosthetic graft in her chest.  patient examined and medical record reviewed,agree with above note. Tharon Aquas Trigt III 12/30/2017

## 2017-12-30 NOTE — Progress Notes (Signed)
Patient ID: Emily Rocha, female   DOB: 20-Aug-1955, 62 y.o.   MRN: [id number] EVENING ROUNDS NOTE :     [address]       [place] [address]             [phone]                 4 Days Post-Op Procedure(s) (LRB): REDO STERNOTOMY (N/A) AORTIC TO RIGHT COMMON CAROTID AND RIGHT SUBCLAVIAN  ARTERY  BYPASS (Right) REPAIR OF DIAPHRAGM  Total Length of Stay:  LOS: 4 days  BP 112/74   Pulse (!) 102   Temp 98.4 F (36.9 C) (Oral)   Resp 15   Ht 5\' 4"  (1.626 m)   Wt 87.8 kg   SpO2 97%   BMI 33.23 kg/m   .Intake/Output      11/14 0701 - 11/15 0700 11/15 0701 - 11/16 0700   P.O. 360 420   I.V. (mL/kg)     IV Piggyback 245.2 100   Total Intake(mL/kg) 605.2 (6.9) 520 (5.9)   Urine (mL/kg/hr) 2285 (1.1) 1000 (1.3)   Chest Tube     Total Output 2285 1000   Net -1679.8 -480          . albumin human    . cefUROXime (ZINACEF)  IV 750 mg (12/30/17 1052)  . dextrose 5 % and 0.45% NaCl Stopped (12/28/17 0858)  . potassium chloride       Lab Results  Component Value Date   WBC 7.6 12/30/2017   HGB 8.4 (L) 12/30/2017   HCT 26.8 (L) 12/30/2017   PLT 225 12/30/2017   GLUCOSE 119 (H) 12/30/2017   CHOL 205 (H) 12/13/2016   TRIG 101 12/13/2016   HDL 68 12/13/2016   LDLCALC 117 (H) 12/13/2016   ALT 30 12/30/2017   AST 24 12/30/2017   NA 137 12/30/2017   K 3.5 12/30/2017   CL 93 (L) 12/30/2017   CREATININE 0.67 12/30/2017   BUN 6 (L) 12/30/2017   CO2 36 (H) 12/30/2017   INR 1.00 12/22/2017   HGBA1C 6.2 (H) 12/22/2017   hoarse  Stable vs Good pulse right arm   Grace Isaac MD  Beeper [phone] Office [phone] 12/30/2017 3:53 PM

## 2017-12-30 NOTE — Progress Notes (Signed)
Patient refused Bipap for the night.

## 2017-12-30 NOTE — Plan of Care (Signed)
  Problem: Education: Goal: Knowledge of General Education information will improve Description Including pain rating scale, medication(s)/side effects and non-pharmacologic comfort measures Outcome: Progressing   Problem: Clinical Measurements: Goal: Ability to maintain clinical measurements within normal limits will improve Outcome: Progressing Goal: Diagnostic test results will improve Outcome: Progressing Goal: Respiratory complications will improve Outcome: Progressing   Problem: Activity: Goal: Risk for activity intolerance will decrease Outcome: Progressing   Problem: Pain Managment: Goal: General experience of comfort will improve Outcome: Progressing

## 2017-12-30 NOTE — Progress Notes (Signed)
[facility]  Subjective  - feels better   Objective (!) 113/47 (!) 106 98 F (36.7 C) (Oral) 15 100%  Intake/Output Summary (Last 24 hours) at 12/30/2017 1041 Last data filed at 12/30/2017 0800 Gross per 24 hour  Intake 324.84 ml  Output 1435 ml  Net -1110.16 ml   2 + right radial pulse Neuro intact UE/LE 5/5  Assessment/Planning: Patent graft Still with some chest tube drainage, WBC normal afebrile Continue to mobilize  [employer] 12/30/2017 10:41 AM --  Laboratory Lab Results: Recent Labs    12/29/17 0606 12/30/17 0410  WBC 15.5* 7.6  HGB 8.9* 8.4*  HCT 28.2* 26.8*  PLT 206 225   BMET Recent Labs    12/29/17 0606 12/30/17 0410  NA 134* 137  K 4.0 3.5  CL 97* 93*  CO2 29 36*  GLUCOSE 137* 119*  BUN 11 6*  CREATININE 0.69 0.67  CALCIUM 8.8* 8.7*    COAG Lab Results  Component Value Date   INR 1.00 12/22/2017   INR 1.40 12/14/2016   INR 1.01 12/12/2016   No results found for: PTT

## 2017-12-31 ENCOUNTER — Inpatient Hospital Stay (HOSPITAL_COMMUNITY): Payer: BLUE CROSS/BLUE SHIELD

## 2017-12-31 LAB — CBC
HCT: 28.1 % — ABNORMAL LOW (ref 36.0–46.0)
Hemoglobin: 9 g/dL — ABNORMAL LOW (ref 12.0–15.0)
MCH: 29.1 pg (ref 26.0–34.0)
MCHC: 32 g/dL (ref 30.0–36.0)
MCV: 90.9 fL (ref 80.0–100.0)
Platelets: 265 10*3/uL (ref 150–400)
RBC: 3.09 MIL/uL — ABNORMAL LOW (ref 3.87–5.11)
RDW: 13.4 % (ref 11.5–15.5)
WBC: 6.5 10*3/uL (ref 4.0–10.5)
nRBC: 0.3 % — ABNORMAL HIGH (ref 0.0–0.2)

## 2017-12-31 LAB — GLUCOSE, CAPILLARY
Glucose-Capillary: 128 mg/dL — ABNORMAL HIGH (ref 70–99)
Glucose-Capillary: 100 mg/dL — ABNORMAL HIGH (ref 70–99)
Glucose-Capillary: 131 mg/dL — ABNORMAL HIGH (ref 70–99)
Glucose-Capillary: 97 mg/dL (ref 70–99)

## 2017-12-31 LAB — COMPREHENSIVE METABOLIC PANEL
ALT: 29 U/L (ref 0–44)
AST: 21 U/L (ref 15–41)
Albumin: 2.7 g/dL — ABNORMAL LOW (ref 3.5–5.0)
Alkaline Phosphatase: 89 U/L (ref 38–126)
Anion gap: 9 (ref 5–15)
BUN: 6 mg/dL — ABNORMAL LOW (ref 8–23)
CO2: 35 mmol/L — ABNORMAL HIGH (ref 22–32)
Calcium: 8.7 mg/dL — ABNORMAL LOW (ref 8.9–10.3)
Chloride: 91 mmol/L — ABNORMAL LOW (ref 98–111)
Creatinine, Ser: 0.6 mg/dL (ref 0.44–1.00)
GFR calc Af Amer: >60 mL/min (ref >60)
GFR calc non Af Amer: >60 mL/min (ref >60)
Glucose, Bld: 130 mg/dL — ABNORMAL HIGH (ref 70–99)
Potassium: 3.7 mmol/L (ref 3.5–5.1)
Sodium: 135 mmol/L (ref 135–145)
Total Bilirubin: 1 mg/dL (ref 0.3–1.2)
Total Protein: 6.2 g/dL — ABNORMAL LOW (ref 6.5–8.1)

## 2017-12-31 MED ORDER — ALTEPLASE 2 MG IJ SOLR
2.0000 mg | INTRAMUSCULAR | Status: DC | PRN
  Filled 2017-12-31 (×2): qty 2

## 2017-12-31 NOTE — Progress Notes (Signed)
[facility]  Subjective  - feels stronger   Objective 117/77 96 98.8 F (37.1 C) 15 95%  Intake/Output Summary (Last 24 hours) at 12/31/2017 7276 Last data filed at 12/31/2017 0600 Gross per 24 hour  Intake 820 ml  Output 1750 ml  Net -930 ml   Still with chest tube drain site drainage when stands 2+ right radial pulse Neuro symmetric UE LE 5/5  Assessment/Planning: Continues to slowly improve Could probably leave ICU today from my standpoint Will most likely need to stay until no further drainage from chest tube site  [facility] 12/31/2017 8:07 AM --  Laboratory Lab Results: Recent Labs    12/29/17 0606 12/30/17 0410  WBC 15.5* 7.6  HGB 8.9* 8.4*  HCT 28.2* 26.8*  PLT 206 225   BMET Recent Labs    12/29/17 0606 12/30/17 0410  NA 134* 137  K 4.0 3.5  CL 97* 93*  CO2 29 36*  GLUCOSE 137* 119*  BUN 11 6*  CREATININE 0.69 0.67  CALCIUM 8.8* 8.7*    COAG Lab Results  Component Value Date   INR 1.00 12/22/2017   INR 1.40 12/14/2016   INR 1.01 12/12/2016   No results found for: PTT

## 2017-12-31 NOTE — Progress Notes (Signed)
In Progress  12/31/17 10:25 PM [place], Emily Roller, RN Consult initiated to de-clott PICC line.Caps changed and unable to flush ports. IVT RNs, M. Nakib and myself assessed the site. Swelling noted at the PICC insertion site and below. Request patient's primary RN speak with provider about ordering a doppler to r/o DVT prior to tPA being instilled.

## 2017-12-31 NOTE — Progress Notes (Addendum)
Patient ID: Emily Rocha, female   DOB: 04/07/1955, 62 y.o.   MRN: [id number] TCTS DAILY ICU PROGRESS NOTE                   [address]            [place] [address]          [phone]   5 Days Post-Op Procedure(s) (LRB): REDO STERNOTOMY (N/A) AORTIC TO RIGHT COMMON CAROTID AND RIGHT SUBCLAVIAN  ARTERY  BYPASS (Right) REPAIR OF DIAPHRAGM  Total Length of Stay:  LOS: 5 days   Subjective: Up to chair getting respiratory treatment  Objective: Vital signs in last 24 hours: Temp:  [98.3 F (36.8 C)-98.8 F (37.1 C)] 98.8 F (37.1 C) (11/16 0035) Pulse Rate:  [91-108] 96 (11/16 0600) Cardiac Rhythm: Normal sinus rhythm (11/16 0400) BP: (107-140)/(51-77) 117/77 (11/16 0500) SpO2:  [92 %-100 %] 95 % (11/16 0600)  Filed Weights   12/27/17 0500 12/28/17 0545 12/30/17 0400  Weight: 91.9 kg 89.7 kg 87.8 kg    Weight change:    Hemodynamic parameters for last 24 hours:    Intake/Output from previous day: 11/15 0701 - 11/16 0700 In: 1000 [P.O.:900; IV Piggyback:100] Out: 1800 [Urine:1800]  Intake/Output this shift: No intake/output data recorded.  Current Meds: Scheduled Meds: . acetaminophen  1,000 mg Oral Q6H   Or  . acetaminophen (TYLENOL) oral liquid 160 mg/5 mL  1,000 mg Oral Q6H  . atorvastatin  40 mg Oral Daily  . bisacodyl  10 mg Oral Daily  . Chlorhexidine Gluconate Cloth  6 each Topical Daily  . famotidine  20 mg Oral BID  . furosemide  20 mg Intravenous BID  . guaiFENesin  600 mg Oral BID  . levalbuterol  0.63 mg Nebulization Q6H WA  . mouth rinse  15 mL Mouth Rinse BID  . metolazone  5 mg Oral Daily  . metoprolol tartrate  12.5 mg Oral BID  . mometasone-formoterol  2 puff Inhalation BID  . potassium chloride  20 mEq Oral BID  . senna-docusate  1 tablet Oral QHS  . sodium chloride flush  10-40 mL Intracatheter Q12H  . umeclidinium bromide  1 puff Inhalation Daily   Continuous Infusions: . albumin human    . cefUROXime (ZINACEF)   IV 750 mg (12/30/17 2217)  . dextrose 5 % and 0.45% NaCl Stopped (12/28/17 0858)  . potassium chloride     PRN Meds:.docusate, fentaNYL (SUBLIMAZE) injection, levalbuterol, ondansetron (ZOFRAN) IV, oxyCODONE, potassium chloride, potassium chloride, RESOURCE THICKENUP CLEAR, sodium chloride flush, traMADol  General appearance: alert, cooperative and no distress Neurologic: intact Heart: regular rate and rhythm, S1, S2 normal, no murmur, click, rub or gallop Lungs: wheezes bilaterally Abdomen: soft, non-tender; bowel sounds normal; no masses,  no organomegaly Extremities: extremities normal, atraumatic, no cyanosis or edema and Homans sign is negative, no sign of DVT Wound: Sternal incision intact Easily palpable pulses in right arm Lab Results: CBC: Recent Labs    12/30/17 0410 12/31/17 0620  WBC 7.6 6.5  HGB 8.4* 9.0*  HCT 26.8* 28.1*  PLT 225 265   BMET:  Recent Labs    12/29/17 0606 12/30/17 0410  NA 134* 137  K 4.0 3.5  CL 97* 93*  CO2 29 36*  GLUCOSE 137* 119*  BUN 11 6*  CREATININE 0.69 0.67  CALCIUM 8.8* 8.7*    CMET: Lab Results  Component Value Date   WBC 6.5 12/31/2017   HGB 9.0 (L)  12/31/2017   HCT 28.1 (L) 12/31/2017   PLT 265 12/31/2017   GLUCOSE 119 (H) 12/30/2017   CHOL 205 (H) 12/13/2016   TRIG 101 12/13/2016   HDL 68 12/13/2016   LDLCALC 117 (H) 12/13/2016   ALT 30 12/30/2017   AST 24 12/30/2017   NA 137 12/30/2017   K 3.5 12/30/2017   CL 93 (L) 12/30/2017   CREATININE 0.67 12/30/2017   BUN 6 (L) 12/30/2017   CO2 36 (H) 12/30/2017   INR 1.00 12/22/2017   HGBA1C 6.2 (H) 12/22/2017      PT/INR: No results for input(s): LABPROT, INR in the last 72 hours. Radiology: No results found.   Assessment/Plan: S/P Procedure(s) (LRB): REDO STERNOTOMY (N/A) AORTIC TO RIGHT COMMON CAROTID AND RIGHT SUBCLAVIAN  ARTERY  BYPASS (Right) REPAIR OF DIAPHRAGM Mobilize Renal function stable Expected Acute  Blood - loss Anemia- continue to monitor    Respiratory status remains greatest limiting factor to progressing, continue aggressive pulmonary treatment Has had some drainage from the lower sternal incision, this morning got up and back into the bed without any evidence of drainage  Grace Isaac 12/31/2017 8:21 AM

## 2017-12-31 NOTE — Progress Notes (Signed)
Patient ID: Emily Rocha, female   DOB: [birth year], 62 y.o.   MRN: [id number] EVENING ROUNDS NOTE :     [address]       [place] [address]             [phone]                 5 Days Post-Op Procedure(s) (LRB): REDO STERNOTOMY (N/A) AORTIC TO RIGHT COMMON CAROTID AND RIGHT SUBCLAVIAN  ARTERY  BYPASS (Right) REPAIR OF DIAPHRAGM  Total Length of Stay:  LOS: 5 days  BP 124/64   Pulse (!) 109   Temp 98.4 F (36.9 C) (Oral)   Resp 15   Ht 5\' 4"  (1.626 m)   Wt 87.8 kg   SpO2 (!) 88%   BMI 33.23 kg/m   .Intake/Output      11/16 0701 - 11/17 0700   P.O. 360   IV Piggyback 15.2   Total Intake(mL/kg) 375.2 (4.3)   Urine (mL/kg/hr) 1850 (1.7)   Total Output 1850   Net -1474.9         . albumin human    . cefUROXime (ZINACEF)  IV 750 mg (12/31/17 0955)  . dextrose 5 % and 0.45% NaCl Stopped (12/28/17 0858)  . potassium chloride       Lab Results  Component Value Date   WBC 6.5 12/31/2017   HGB 9.0 (L) 12/31/2017   HCT 28.1 (L) 12/31/2017   PLT 265 12/31/2017   GLUCOSE 130 (H) 12/31/2017   CHOL 205 (H) 12/13/2016   TRIG 101 12/13/2016   HDL 68 12/13/2016   LDLCALC 117 (H) 12/13/2016   ALT 29 12/31/2017   AST 21 12/31/2017   NA 135 12/31/2017   K 3.7 12/31/2017   CL 91 (L) 12/31/2017   CREATININE 0.60 12/31/2017   BUN 6 (L) 12/31/2017   CO2 35 (H) 12/31/2017   INR 1.00 12/22/2017   HGBA1C 6.2 (H) 12/22/2017    Stable day  Respiratory status improving  Grace Isaac MD  Beeper [phone] Office [phone] 12/31/2017 7:33 PM

## 2018-01-01 ENCOUNTER — Inpatient Hospital Stay (HOSPITAL_COMMUNITY): Payer: BLUE CROSS/BLUE SHIELD

## 2018-01-01 ENCOUNTER — Encounter (HOSPITAL_COMMUNITY): Payer: BLUE CROSS/BLUE SHIELD

## 2018-01-01 DIAGNOSIS — R609 Edema, unspecified: Secondary | ICD-10-CM

## 2018-01-01 LAB — GLUCOSE, CAPILLARY
Glucose-Capillary: 143 mg/dL — ABNORMAL HIGH (ref 70–99)
Glucose-Capillary: 114 mg/dL — ABNORMAL HIGH (ref 70–99)
Glucose-Capillary: 119 mg/dL — ABNORMAL HIGH (ref 70–99)

## 2018-01-01 LAB — AEROBIC CULTURE W GRAM STAIN (SUPERFICIAL SPECIMEN): Culture: NO GROWTH

## 2018-01-01 LAB — COMPREHENSIVE METABOLIC PANEL
ALT: 26 U/L (ref 0–44)
AST: 20 U/L (ref 15–41)
Albumin: 2.7 g/dL — ABNORMAL LOW (ref 3.5–5.0)
Alkaline Phosphatase: 90 U/L (ref 38–126)
Anion gap: 13 (ref 5–15)
BUN: 6 mg/dL — ABNORMAL LOW (ref 8–23)
CO2: 33 mmol/L — ABNORMAL HIGH (ref 22–32)
Calcium: 8.9 mg/dL (ref 8.9–10.3)
Chloride: 87 mmol/L — ABNORMAL LOW (ref 98–111)
Creatinine, Ser: 0.5 mg/dL (ref 0.44–1.00)
GFR calc Af Amer: >60 mL/min (ref >60)
GFR calc non Af Amer: >60 mL/min (ref >60)
Glucose, Bld: 116 mg/dL — ABNORMAL HIGH (ref 70–99)
Potassium: 3.2 mmol/L — ABNORMAL LOW (ref 3.5–5.1)
Sodium: 133 mmol/L — ABNORMAL LOW (ref 135–145)
Total Bilirubin: 1 mg/dL (ref 0.3–1.2)
Total Protein: 6.1 g/dL — ABNORMAL LOW (ref 6.5–8.1)

## 2018-01-01 LAB — HEPARIN LEVEL (UNFRACTIONATED): Heparin Unfractionated: <0.1 IU/mL — ABNORMAL LOW (ref 0.30–0.70)

## 2018-01-01 LAB — CBC
HCT: 28.8 % — ABNORMAL LOW (ref 36.0–46.0)
Hemoglobin: 9.4 g/dL — ABNORMAL LOW (ref 12.0–15.0)
MCH: 28.9 pg (ref 26.0–34.0)
MCHC: 32.6 g/dL (ref 30.0–36.0)
MCV: 88.6 fL (ref 80.0–100.0)
Platelets: 281 10*3/uL (ref 150–400)
RBC: 3.25 MIL/uL — ABNORMAL LOW (ref 3.87–5.11)
RDW: 13.2 % (ref 11.5–15.5)
WBC: 9.3 10*3/uL (ref 4.0–10.5)
nRBC: 0.5 % — ABNORMAL HIGH (ref 0.0–0.2)

## 2018-01-01 MED ORDER — HEPARIN (PORCINE) 25000 UT/250ML-% IV SOLN
800.0000 [IU]/h | INTRAVENOUS | Status: DC
  Administered 2018-01-01: 750 [IU]/h via INTRAVENOUS
  Administered 2018-01-02 – 2018-01-04 (×2): 800 [IU]/h via INTRAVENOUS
  Filled 2018-01-01 (×2): qty 250

## 2018-01-01 MED ORDER — POTASSIUM CHLORIDE CRYS ER 20 MEQ PO TBCR
20.0000 meq | EXTENDED_RELEASE_TABLET | ORAL | Status: AC
  Administered 2018-01-01 (×3): 20 meq via ORAL
  Filled 2018-01-01 (×2): qty 1

## 2018-01-01 MED ORDER — ALPRAZOLAM 0.5 MG PO TABS
0.5000 mg | ORAL_TABLET | Freq: Two times a day (BID) | ORAL | Status: DC | PRN
  Administered 2018-01-01: 0.5 mg via ORAL
  Filled 2018-01-01: qty 1

## 2018-01-01 NOTE — Progress Notes (Signed)
ANTICOAGULATION CONSULT NOTE - Initial Consult  Pharmacy Consult for Heparin  Indication: DVT LUE   No Known Allergies  Patient Measurements: Height: 5\' 4"  (162.6 cm) Weight: 187 lb 2.7 oz (84.9 kg) IBW/kg (Calculated) : 54.7 Heparin Dosing Weight: 60kg  Vital Signs: Temp: 98.9 F (37.2 C) (11/17 1625) Temp Source: Oral (11/17 1625) BP: 133/69 (11/17 1000) Pulse Rate: 116 (11/17 1800)  Labs: Recent Labs    12/30/17 0410 12/31/17 0620 01/01/18 0228 01/01/18 1808  HGB 8.4* 9.0* 9.4*  --   HCT 26.8* 28.1* 28.8*  --   PLT 225 265 281  --   HEPARINUNFRC  --   --   --  <0.10*  CREATININE 0.67 0.60 0.50  --     Estimated Creatinine Clearance: 74.9 mL/min (by C-G formula based on SCr of 0.5 mg/dL).   Assessment: 62yof with Hx CAD-CABG 10/18, CVD hx TIAs admitted for REDO STERNOTOMY (N/A) WEDGE RESECTION RUL NODULE (Right) AORTIC TO RIGHT CAROTID  ARTERY  BYPASS (Right) - per TCTS.   Noted to have LUE swelling near PICC line and doppler + for DVT.  Will begin iv heparin drip - no bolus given recent surgery and will run HL near low end of therapeutic goal.   No current bleeding noted, h/h pltc stable post op   Initial hep lvl < 0.1  Goal of Therapy:  Heparin level 0.3-0.5 units/ml Monitor platelets by anticoagulation protocol: Yes   Plan:  No bolus Increase heparin drip 950 uts/hr HL in 6hr  Daily HL, CBC Monitor s/s bleeding  Levester Fresh, PharmD, BCPS, BCCCP Clinical Pharmacist [phone]  Please check AMION for all [facility] numbers  01/01/2018 7:10 PM

## 2018-01-01 NOTE — Progress Notes (Signed)
VASCULAR LAB PRELIMINARY  PRELIMINARY  PRELIMINARY  PRELIMINARY  Left upper extremity venous duplex completed.    Preliminary report:  There is acute DVT noted in the left internal jugular, subclavian, and axillary veins.  There is acute SVT noted in the left cephalic vein (PICC line placed in cephalic vein).   Burdette Forehand, RVT 01/01/2018, 9:45 AM

## 2018-01-01 NOTE — Progress Notes (Signed)
Patient ID: Emily Rocha, female   DOB: [birth year], 62 y.o.   MRN: [id number] EVENING ROUNDS NOTE :     [address]       [place] [address]             [phone]                 6 Days Post-Op Procedure(s) (LRB): REDO STERNOTOMY (N/A) AORTIC TO RIGHT COMMON CAROTID AND RIGHT SUBCLAVIAN  ARTERY  BYPASS (Right) REPAIR OF DIAPHRAGM  Total Length of Stay:  LOS: 6 days  BP 133/69   Pulse (!) 102   Temp 98.9 F (37.2 C) (Oral)   Resp 15   Ht 5\' 4"  (1.626 m)   Wt 84.9 kg   SpO2 95%   BMI 32.13 kg/m   .Intake/Output      11/16 0701 - 11/17 0700 11/17 0701 - 11/18 0700   P.O. 360    Other 60    IV Piggyback 148.7    Total Intake(mL/kg) 568.7 (6.7)    Urine (mL/kg/hr) 2600 (1.3) 700 (0.7)   Total Output 2600 700   Net -2031.3 -700          . albumin human    . cefUROXime (ZINACEF)  IV 750 mg (01/01/18 1123)  . dextrose 5 % and 0.45% NaCl Stopped (12/28/17 0858)  . heparin 750 Units/hr (01/01/18 1115)  . potassium chloride       Lab Results  Component Value Date   WBC 9.3 01/01/2018   HGB 9.4 (L) 01/01/2018   HCT 28.8 (L) 01/01/2018   PLT 281 01/01/2018   GLUCOSE 116 (H) 01/01/2018   CHOL 205 (H) 12/13/2016   TRIG 101 12/13/2016   HDL 68 12/13/2016   LDLCALC 117 (H) 12/13/2016   ALT 26 01/01/2018   AST 20 01/01/2018   NA 133 (L) 01/01/2018   K 3.2 (L) 01/01/2018   CL 87 (L) 01/01/2018   CREATININE 0.50 01/01/2018   BUN 6 (L) 01/01/2018   CO2 33 (H) 01/01/2018   INR 1.00 12/22/2017   HGBA1C 6.2 (H) 12/22/2017   dvt left ij and subclavian on heparin pic removed  Respiratory status Casey Burkitt MD  Beeper [phone] Office [phone] 01/01/2018 6:30 PM

## 2018-01-01 NOTE — Progress Notes (Signed)
Patient ID: Emily Rocha, female   DOB: 09-Dec-1955, 62 y.o.   MRN: [id number] TCTS DAILY ICU PROGRESS NOTE                   [address]            [place] [address]          [phone]   6 Days Post-Op Procedure(s) (LRB): REDO STERNOTOMY (N/A) AORTIC TO RIGHT COMMON CAROTID AND RIGHT SUBCLAVIAN  ARTERY  BYPASS (Right) REPAIR OF DIAPHRAGM  Total Length of Stay:  LOS: 6 days   Subjective: Patient says she feels better, still very wheezy at times  Objective: Vital signs in last 24 hours: Temp:  [98.1 F (36.7 C)-98.6 F (37 C)] 98.1 F (36.7 C) (11/17 0400) Pulse Rate:  [85-111] 101 (11/17 0510) Cardiac Rhythm: Sinus tachycardia (11/17 0510) BP: (107-131)/(62-86) 110/86 (11/17 0510) SpO2:  [88 %-99 %] 96 % (11/17 0830) Weight:  [84.9 kg] 84.9 kg (11/17 0430)  Filed Weights   12/28/17 0545 12/30/17 0400 01/01/18 0430  Weight: 89.7 kg 87.8 kg 84.9 kg    Weight change:    Hemodynamic parameters for last 24 hours:    Intake/Output from previous day: 11/16 0701 - 11/17 0700 In: 568.7 [P.O.:360; IV Piggyback:148.7] Out: 2600 [Urine:2600]  Intake/Output this shift: No intake/output data recorded.  Current Meds: Scheduled Meds: . atorvastatin  40 mg Oral Daily  . bisacodyl  10 mg Oral Daily  . Chlorhexidine Gluconate Cloth  6 each Topical Daily  . famotidine  20 mg Oral BID  . furosemide  20 mg Intravenous BID  . guaiFENesin  600 mg Oral BID  . levalbuterol  0.63 mg Nebulization Q6H WA  . mouth rinse  15 mL Mouth Rinse BID  . metoprolol tartrate  12.5 mg Oral BID  . mometasone-formoterol  2 puff Inhalation BID  . potassium chloride  20 mEq Oral BID  . potassium chloride  20 mEq Oral Q4H  . senna-docusate  1 tablet Oral QHS  . sodium chloride flush  10-40 mL Intracatheter Q12H  . umeclidinium bromide  1 puff Inhalation Daily   Continuous Infusions: . albumin human    . cefUROXime (ZINACEF)  IV 20 mL/hr at 01/01/18 0000  . dextrose 5 % and  0.45% NaCl Stopped (12/28/17 0858)  . potassium chloride     PRN Meds:.alteplase, docusate, fentaNYL (SUBLIMAZE) injection, levalbuterol, ondansetron (ZOFRAN) IV, oxyCODONE, potassium chloride, potassium chloride, RESOURCE THICKENUP CLEAR, sodium chloride flush, traMADol  General appearance: alert, cooperative, appears older than stated age and no distress Neurologic: intact Heart: regular rate and rhythm, S1, S2 normal, no murmur, click, rub or gallop Lungs: wheezes bilaterally Abdomen: soft, non-tender; bowel sounds normal; no masses,  no organomegaly Extremities: Patient's left lower arm is moderately edematous compared to the right, the left upper arm is not swollen, PICC line is in the left antecubital Wound: Sternum is stable, chest tube site continues to drain serous fluid and is being packed  Lab Results: CBC: Recent Labs    12/31/17 0620 01/01/18 0228  WBC 6.5 9.3  HGB 9.0* 9.4*  HCT 28.1* 28.8*  PLT 265 281   BMET:  Recent Labs    12/31/17 0620 01/01/18 0228  NA 135 133*  K 3.7 3.2*  CL 91* 87*  CO2 35* 33*  GLUCOSE 130* 116*  BUN 6* 6*  CREATININE 0.60 0.50  CALCIUM 8.7* 8.9    CMET: Lab Results  Component Value Date  WBC 9.3 01/01/2018   HGB 9.4 (L) 01/01/2018   HCT 28.8 (L) 01/01/2018   PLT 281 01/01/2018   GLUCOSE 116 (H) 01/01/2018   CHOL 205 (H) 12/13/2016   TRIG 101 12/13/2016   HDL 68 12/13/2016   LDLCALC 117 (H) 12/13/2016   ALT 26 01/01/2018   AST 20 01/01/2018   NA 133 (L) 01/01/2018   K 3.2 (L) 01/01/2018   CL 87 (L) 01/01/2018   CREATININE 0.50 01/01/2018   BUN 6 (L) 01/01/2018   CO2 33 (H) 01/01/2018   INR 1.00 12/22/2017   HGBA1C 6.2 (H) 12/22/2017      PT/INR: No results for input(s): LABPROT, INR in the last 72 hours. Radiology: No results found.   Assessment/Plan: S/P Procedure(s) (LRB): REDO STERNOTOMY (N/A) AORTIC TO RIGHT COMMON CAROTID AND RIGHT SUBCLAVIAN  ARTERY  BYPASS (Right) REPAIR OF DIAPHRAGM Hypokalemia  replacing potassium p.o. Severe underlying COPD continue aggressive treatment Possible left subclavian DVT related to PICC line, waiting for stat ultrasound of the arm.    Grace Isaac 01/01/2018 8:40 AM

## 2018-01-01 NOTE — Progress Notes (Signed)
Patient refused BIPAP for the night.  

## 2018-01-01 NOTE — Progress Notes (Signed)
Dr Oneida Alar rounding and spoke to him about left arm below PICC line being edematous and change in colo, dusky, as different from yesterday.  Changed venous duplex to stat.

## 2018-01-01 NOTE — Progress Notes (Signed)
Duplex positive for catheter induced DVT left arm.  Will start heparin IV today without bolus.  Will need to transition to oral agent prior to d/c   [name], MD [facility]: [phone] Pager: [phone]

## 2018-01-01 NOTE — Progress Notes (Signed)
[facility]  Subjective  - feels ok just tired   Objective 110/86 (!) 101 98.1 F (36.7 C) (Oral) 15 96%  Intake/Output Summary (Last 24 hours) at 01/01/2018 6435 Last data filed at 01/01/2018 0400 Gross per 24 hour  Intake 568.69 ml  Output 2600 ml  Net -2031.31 ml   Left upper extremity edematous with bluish hue, hand warm Right upper extremity 2+ radial pulse Neuro intact Still some hoarseness Still with chest tube site drainage Sternotomy healing without drainage  Assessment/Planning: S/p aorto carotid subclavian bypass neuro doing well Persistant chest tube site drainage but no evidence of infection currently Most likely left arm DVT from PICC- remove PICC now. If duplex positive will need heparin and eventual transition to oral agent  [name] 01/01/2018 8:33 AM --  Laboratory Lab Results: Recent Labs    12/31/17 0620 01/01/18 0228  WBC 6.5 9.3  HGB 9.0* 9.4*  HCT 28.1* 28.8*  PLT 265 281   BMET Recent Labs    12/31/17 0620 01/01/18 0228  NA 135 133*  K 3.7 3.2*  CL 91* 87*  CO2 35* 33*  GLUCOSE 130* 116*  BUN 6* 6*  CREATININE 0.60 0.50  CALCIUM 8.7* 8.9    COAG Lab Results  Component Value Date   INR 1.00 12/22/2017   INR 1.40 12/14/2016   INR 1.01 12/12/2016   No results found for: PTT

## 2018-01-01 NOTE — Progress Notes (Signed)
PICC line removed per order. Vaseline gauze and gauze dressing applied with pressure held for 5 minutes.  Patient and RN aware to stay in bed for 30 minutes until 12:45pm.  Dressing clean, dry, and intact.

## 2018-01-01 NOTE — Progress Notes (Signed)
ANTICOAGULATION CONSULT NOTE - Initial Consult  Pharmacy Consult for Heparin  Indication: DVT LUE   No Known Allergies  Patient Measurements: Height: 5\' 4"  (162.6 cm) Weight: 187 lb 2.7 oz (84.9 kg) IBW/kg (Calculated) : 54.7 Heparin Dosing Weight: 60kg  Vital Signs: Temp: 98.3 F (36.8 C) (11/17 0819) Temp Source: Oral (11/17 0819) BP: 133/69 (11/17 1000) Pulse Rate: 103 (11/17 1000)  Labs: Recent Labs    12/30/17 0410 12/31/17 0620 01/01/18 0228  HGB 8.4* 9.0* 9.4*  HCT 26.8* 28.1* 28.8*  PLT 225 265 281  CREATININE 0.67 0.60 0.50    Estimated Creatinine Clearance: 74.9 mL/min (by C-G formula based on SCr of 0.5 mg/dL).   Medical History: Past Medical History:  Diagnosis Date  . Asthma   . COPD (chronic obstructive pulmonary disease) ([place])   . Coronary artery disease    hx CABG  . GERD (gastroesophageal reflux disease)   . Pneumonia   . Shortness of breath dyspnea   . Stroke [facility])    "mini stroke" numbness and weakness to left side of the body     Assessment: 62yof with Hx CAD-CABG 10/18, CVD hx TIAs admitted for REDO STERNOTOMY (N/A) WEDGE RESECTION RUL NODULE (Right) AORTIC TO RIGHT CAROTID  ARTERY  BYPASS (Right) - per TCTS.   Noted to have LUE swelling near PICC line and doppler + for DVT.  Will begin iv heparin drip - no bolus given recent surgery and will run HL near low end of therapeutic goal.   No current bleeding noted, h/h pltc stable post op   Goal of Therapy:  Heparin level 0.3-0.5 units/ml Monitor platelets by anticoagulation protocol: Yes   Plan:  No bolus Begin Heparin drip 750 uts/hr HL in 6hr  Daily HL, CBC Monitor s/s bleeding  Bonnita Nasuti Pharm.D. CPP, BCPS Clinical Pharmacist [phone] 01/01/2018 10:38 AM

## 2018-01-02 LAB — HEPARIN LEVEL (UNFRACTIONATED): Heparin Unfractionated: <0.1 IU/mL — ABNORMAL LOW (ref 0.30–0.70)

## 2018-01-02 LAB — CBC
HCT: 29.7 % — ABNORMAL LOW (ref 36.0–46.0)
Hemoglobin: 9.6 g/dL — ABNORMAL LOW (ref 12.0–15.0)
MCH: 28.9 pg (ref 26.0–34.0)
MCHC: 32.3 g/dL (ref 30.0–36.0)
MCV: 89.5 fL (ref 80.0–100.0)
Platelets: 283 10*3/uL (ref 150–400)
RBC: 3.32 MIL/uL — ABNORMAL LOW (ref 3.87–5.11)
RDW: 13.2 % (ref 11.5–15.5)
WBC: 12.3 10*3/uL — ABNORMAL HIGH (ref 4.0–10.5)
nRBC: 0.3 % — ABNORMAL HIGH (ref 0.0–0.2)

## 2018-01-02 LAB — BASIC METABOLIC PANEL
Anion gap: 9 (ref 5–15)
BUN: 11 mg/dL (ref 8–23)
CO2: 33 mmol/L — ABNORMAL HIGH (ref 22–32)
Calcium: 8.7 mg/dL — ABNORMAL LOW (ref 8.9–10.3)
Chloride: 92 mmol/L — ABNORMAL LOW (ref 98–111)
Creatinine, Ser: 0.63 mg/dL (ref 0.44–1.00)
GFR calc Af Amer: >60 mL/min (ref >60)
GFR calc non Af Amer: >60 mL/min (ref >60)
Glucose, Bld: 123 mg/dL — ABNORMAL HIGH (ref 70–99)
Potassium: 3.2 mmol/L — ABNORMAL LOW (ref 3.5–5.1)
Sodium: 134 mmol/L — ABNORMAL LOW (ref 135–145)

## 2018-01-02 LAB — GLUCOSE, CAPILLARY
Glucose-Capillary: 128 mg/dL — ABNORMAL HIGH (ref 70–99)
Glucose-Capillary: 118 mg/dL — ABNORMAL HIGH (ref 70–99)
Glucose-Capillary: 115 mg/dL — ABNORMAL HIGH (ref 70–99)
Glucose-Capillary: 109 mg/dL — ABNORMAL HIGH (ref 70–99)
Glucose-Capillary: 151 mg/dL — ABNORMAL HIGH (ref 70–99)

## 2018-01-02 MED ORDER — FUROSEMIDE 10 MG/ML IJ SOLN
20.0000 mg | Freq: Every day | INTRAMUSCULAR | Status: DC
  Administered 2018-01-02: 20 mg via INTRAVENOUS
  Filled 2018-01-02: qty 2

## 2018-01-02 MED ORDER — WARFARIN - PHYSICIAN DOSING INPATIENT
Freq: Every day | Status: DC
  Administered 2018-01-04 – 2018-01-08 (×2): 1
  Administered 2018-01-09 – 2018-01-11 (×3)

## 2018-01-02 MED ORDER — ALPRAZOLAM 0.5 MG PO TABS
0.5000 mg | ORAL_TABLET | Freq: Three times a day (TID) | ORAL | Status: DC | PRN
  Administered 2018-01-02 – 2018-01-13 (×18): 0.5 mg via ORAL
  Filled 2018-01-02 (×19): qty 1

## 2018-01-02 MED ORDER — WARFARIN SODIUM 2.5 MG PO TABS
2.5000 mg | ORAL_TABLET | Freq: Every day | ORAL | Status: DC
  Administered 2018-01-02: 2.5 mg via ORAL
  Filled 2018-01-02: qty 1

## 2018-01-02 MED ORDER — HALOPERIDOL LACTATE 5 MG/ML IJ SOLN
INTRAMUSCULAR | Status: AC
  Filled 2018-01-02: qty 1

## 2018-01-02 MED ORDER — LEVALBUTEROL HCL 0.63 MG/3ML IN NEBU
0.6300 mg | INHALATION_SOLUTION | Freq: Two times a day (BID) | RESPIRATORY_TRACT | Status: DC
  Administered 2018-01-03 – 2018-01-12 (×19): 0.63 mg via RESPIRATORY_TRACT
  Filled 2018-01-02 (×21): qty 3

## 2018-01-02 NOTE — Plan of Care (Signed)
  Problem: Health Behavior/Discharge Planning: Goal: Ability to manage health-related needs will improve Outcome: Progressing   Problem: Clinical Measurements: Goal: Ability to maintain clinical measurements within normal limits will improve Outcome: Progressing Goal: Diagnostic test results will improve Outcome: Progressing Goal: Respiratory complications will improve Outcome: Progressing Goal: Cardiovascular complication will be avoided Outcome: Progressing   Problem: Activity: Goal: Risk for activity intolerance will decrease Outcome: Progressing   Problem: Nutrition: Goal: Adequate nutrition will be maintained Outcome: Progressing   Problem: Elimination: Goal: Will not experience complications related to bowel motility Outcome: Progressing   

## 2018-01-02 NOTE — Progress Notes (Signed)
  Speech Language Pathology Treatment: Dysphagia  Patient Details Name: Emily Rocha MRN: [id number] DOB: [birth year] Today's Date: 01/02/2018 Time: 0912-0926 SLP Time Calculation (min) (ACUTE ONLY): 14 min  Assessment / Plan / Recommendation Clinical Impression  Pt was alert and engaged throughout dysphagia treatment session today. Provided min verbal cues, pt accepted sips of honey thick liquid and upgraded regular texture PO with only one instance of delayed cough observed. She independently recalled reasoning for thickened liquids and SLP emphasized importance of continued use for time being. Pt denies difficulties or feeling strangled during PO intake since last ST visit and is optimistic to move out of ICU soon. Given improving overall medical status and minimal s/s aspiration, recommend continue dysphagia 3 (mechanical soft), honey thick liquids for now and ST will repeat MBS tomorrow to reassess swallow function and determine appropriateness for liquid advancement.    HPI HPI: Pt is a 62 year old female presented 12/26/17 for redo sternotomy with aortic to right common carotid and right subclavian artery bypass surgery - intubated for 1 day (11/11-11/12). PMH heavy tobacco use (quit 1 year ago), asthma, GERD, pneumonia, SOB, PVD, stroke. Pt also had CABG x3 1 year ago; was recovering well until presented with 2-3 episodes TIA in Oct 2019. CXR showed mild bibasilar atelectasis slightly increased on right comapred to last exam. Per chart, swallow eval requested due to concern for possible nerve damage from surgery. MBS on 12/28/17 recc D3/Honey.      SLP Plan  MBS       Recommendations  Diet recommendations: Dysphagia 3 (mechanical soft);Honey-thick liquid Liquids provided via: Cup Medication Administration: Whole meds with puree Supervision: Patient able to self feed;Intermittent supervision to cue for compensatory strategies Compensations: Minimize environmental distractions;Slow  rate;Small sips/bites;Clear throat intermittently Postural Changes and/or Swallow Maneuvers: Seated upright 90 degrees                Oral Care Recommendations: Oral care BID Follow up Recommendations: 24 hour supervision/assistance SLP Visit Diagnosis: Dysphagia, unspecified (R13.10) Plan: MBS       Emily Rocha, Student SLP                 Emily Rocha 01/02/2018, 10:31 AM

## 2018-01-02 NOTE — Plan of Care (Deleted)
Continue current care plan.  Tressie Ellis, RN

## 2018-01-02 NOTE — Progress Notes (Signed)
7 Days Post-Op Procedure(s) (LRB): REDO STERNOTOMY (N/A) AORTIC TO RIGHT COMMON CAROTID AND RIGHT SUBCLAVIAN  ARTERY  BYPASS (Right) REPAIR OF DIAPHRAGM Subjective: Mental status back to baseline- prob xanax withdrawl O2 sat good on 2 L Objective: Vital signs in last 24 hours: Temp:  [98.3 F (36.8 C)-99.3 F (37.4 C)] 99.3 F (37.4 C) (11/18 0406) Pulse Rate:  [95-117] 106 (11/18 0600) Cardiac Rhythm: Sinus tachycardia (11/18 0748) Resp:  [19-24] 19 (11/18 0600) BP: (101-134)/(62-87) 101/62 (11/18 0600) SpO2:  [93 %-99 %] 95 % (11/18 0600) Weight:  [83.7 kg] 83.7 kg (11/18 0406)  Hemodynamic parameters for last 24 hours:  nsr  Intake/Output from previous day: 11/17 0701 - 11/18 0700 In: 411.2 [I.V.:156.2; IV Piggyback:255.1] Out: 1250 [Urine:1250] Intake/Output this shift: Total I/O In: 11.2 [I.V.:11.2] Out: -   Scattered rhonci Incision clean , dry Scant drainage from drain site Neuro intact Lab Results: Recent Labs    01/01/18 0228 01/02/18 0642  WBC 9.3 12.3*  HGB 9.4* 9.6*  HCT 28.8* 29.7*  PLT 281 283   BMET:  Recent Labs    01/01/18 0228 01/02/18 0642  NA 133* 134*  K 3.2* 3.2*  CL 87* 92*  CO2 33* 33*  GLUCOSE 116* 123*  BUN 6* 11  CREATININE 0.50 0.63  CALCIUM 8.9 8.7*    PT/INR: No results for input(s): LABPROT, INR in the last 72 hours. ABG    Component Value Date/Time   PHART 7.362 12/28/2017 0546   HCO3 29.6 (H) 12/28/2017 0546   TCO2 31 12/28/2017 0546   O2SAT 92.0 12/28/2017 0546   CBG (last 3)  Recent Labs    01/01/18 1227 01/01/18 1620 01/02/18 0642  GLUCAP 114* 119* 118*    Assessment/Plan: S/P Procedure(s) (LRB): REDO STERNOTOMY (N/A) AORTIC TO RIGHT COMMON CAROTID AND RIGHT SUBCLAVIAN  ARTERY  BYPASS (Right) REPAIR OF DIAPHRAGM Tx to stepdown coumadin started for L arm DVT   LOS: 7 days    Emily Rocha 01/02/2018

## 2018-01-02 NOTE — Progress Notes (Signed)
ANTICOAGULATION CONSULT NOTE - Initial Consult  Pharmacy Consult for Heparin  Indication: DVT LUE   No Known Allergies  Patient Measurements: Height: 5\' 4"  (162.6 cm) Weight: 184 lb 8.4 oz (83.7 kg) IBW/kg (Calculated) : 54.7 Heparin Dosing Weight: 60kg  Vital Signs: Temp: 99.3 F (37.4 C) (11/18 0406) Temp Source: Axillary (11/18 0406) BP: 101/62 (11/18 0600) Pulse Rate: 106 (11/18 0600)  Labs: Recent Labs    12/31/17 0620 01/01/18 0228 01/01/18 1808 01/02/18 0642  HGB 9.0* 9.4*  --  9.6*  HCT 28.1* 28.8*  --  29.7*  PLT 265 281  --  283  HEPARINUNFRC  --   --  <0.10* <0.10*  CREATININE 0.60 0.50  --   --     Estimated Creatinine Clearance: 74.4 mL/min (by C-G formula based on SCr of 0.5 mg/dL).   Assessment: 62yof with Hx CAD-CABG 10/18, CVD hx TIAs admitted for redo sternotomy, wedge resection RUL nodule, and aortic to R carotid artery bypass on 11/11.   Noted to have LUE swelling near PICC line and doppler + for DVT. Heparin level has been undetectable x2 draws. Given recent surgery, will keep heparin infusion at 800 units/hr as discussed with TCTS. Plan to initiate warfarin therapy to eventually transition off the infusion.   Hgb 9.6, plt 283. No s/sx of bleeding. No infusion issues.     Goal of Therapy:  Heparin level 0.3-0.5 units/ml Monitor platelets by anticoagulation protocol: Yes   Plan:  Continue heparin drip 950 uts/hr Daily HL, CBC Monitor s/s bleeding  Antonietta Jewel, PharmD Clinical Pharmacist  Pager: [phone] Phone: [phone] Please check AMION for all [place] numbers 01/02/2018 7:38 AM

## 2018-01-02 NOTE — Plan of Care (Signed)
  Problem: Clinical Measurements: Goal: Ability to maintain clinical measurements within normal limits will improve Outcome: Progressing Goal: Diagnostic test results will improve Outcome: Progressing Goal: Respiratory complications will improve Outcome: Progressing Goal: Cardiovascular complication will be avoided Outcome: Progressing   Problem: Activity: Goal: Risk for activity intolerance will decrease Outcome: Progressing   Problem: Nutrition: Goal: Adequate nutrition will be maintained Outcome: Progressing   Problem: Elimination: Goal: Will not experience complications related to bowel motility Outcome: Progressing

## 2018-01-02 NOTE — Progress Notes (Signed)
TCTS BRIEF SICU PROGRESS NOTE  7 Days Post-Op  S/P Procedure(s) (LRB): REDO STERNOTOMY (N/A) AORTIC TO RIGHT COMMON CAROTID AND RIGHT SUBCLAVIAN  ARTERY  BYPASS (Right) REPAIR OF DIAPHRAGM   Stable day.  No agitation. Sinus tach w/ stable BP Breathing comfortably w/ O2 sats 97% on 3 L/min  Plan: Continue current plan  Rexene Alberts, MD 01/02/2018 6:55 PM

## 2018-01-02 NOTE — Progress Notes (Signed)
[facility]  Subjective  - confused last night more lucid currently   Objective 128/69 (!) 110 98.4 F (36.9 C) (Oral) (!) 25 96%  Intake/Output Summary (Last 24 hours) at 01/02/2018 1556 Last data filed at 01/02/2018 1500 Gross per 24 hour  Intake 841.28 ml  Output 1204 ml  Net -362.72 ml   Incision healing still some chest tube drainage 2+ right radial pulse Neuro intact except hoarseness  Assessment/Planning: Continues to recover Plan for 2C later today  [name] 01/02/2018 3:56 PM --  Laboratory Lab Results: Recent Labs    01/01/18 0228 01/02/18 0642  WBC 9.3 12.3*  HGB 9.4* 9.6*  HCT 28.8* 29.7*  PLT 281 283   BMET Recent Labs    01/01/18 0228 01/02/18 0642  NA 133* 134*  K 3.2* 3.2*  CL 87* 92*  CO2 33* 33*  GLUCOSE 116* 123*  BUN 6* 11  CREATININE 0.50 0.63  CALCIUM 8.9 8.7*    COAG Lab Results  Component Value Date   INR 1.00 12/22/2017   INR 1.40 12/14/2016   INR 1.01 12/12/2016   No results found for: PTT

## 2018-01-03 ENCOUNTER — Inpatient Hospital Stay (HOSPITAL_COMMUNITY): Payer: BLUE CROSS/BLUE SHIELD

## 2018-01-03 LAB — BASIC METABOLIC PANEL
Anion gap: 12 (ref 5–15)
BUN: 11 mg/dL (ref 8–23)
CO2: 30 mmol/L (ref 22–32)
Calcium: 8.6 mg/dL — ABNORMAL LOW (ref 8.9–10.3)
Chloride: 91 mmol/L — ABNORMAL LOW (ref 98–111)
Creatinine, Ser: 0.6 mg/dL (ref 0.44–1.00)
GFR calc Af Amer: >60 mL/min (ref >60)
GFR calc non Af Amer: >60 mL/min (ref >60)
Glucose, Bld: 104 mg/dL — ABNORMAL HIGH (ref 70–99)
Potassium: 4 mmol/L (ref 3.5–5.1)
Sodium: 133 mmol/L — ABNORMAL LOW (ref 135–145)

## 2018-01-03 LAB — GLUCOSE, CAPILLARY
Glucose-Capillary: 129 mg/dL — ABNORMAL HIGH (ref 70–99)
Glucose-Capillary: 136 mg/dL — ABNORMAL HIGH (ref 70–99)
Glucose-Capillary: 146 mg/dL — ABNORMAL HIGH (ref 70–99)
Glucose-Capillary: 141 mg/dL — ABNORMAL HIGH (ref 70–99)

## 2018-01-03 LAB — PROTIME-INR
INR: 1.12
Prothrombin Time: 14.3 seconds (ref 11.4–15.2)

## 2018-01-03 LAB — HEPARIN LEVEL (UNFRACTIONATED): Heparin Unfractionated: <0.1 IU/mL — ABNORMAL LOW (ref 0.30–0.70)

## 2018-01-03 MED ORDER — WARFARIN SODIUM 4 MG PO TABS
4.0000 mg | ORAL_TABLET | Freq: Every day | ORAL | Status: DC
  Administered 2018-01-03: 4 mg via ORAL
  Filled 2018-01-03: qty 1

## 2018-01-03 MED ORDER — FUROSEMIDE 40 MG PO TABS
40.0000 mg | ORAL_TABLET | Freq: Every day | ORAL | Status: DC
  Administered 2018-01-03 – 2018-01-13 (×10): 40 mg via ORAL
  Filled 2018-01-03 (×11): qty 1

## 2018-01-03 NOTE — Progress Notes (Signed)
[facility]  Subjective  - feels stronger   Objective 107/65 (!) 113 98.2 F (36.8 C) (Oral) 20 96%  Intake/Output Summary (Last 24 hours) at 01/03/2018 1150 Last data filed at 01/03/2018 2122 Gross per 24 hour  Intake 1160.39 ml  Output 852 ml  Net 308.39 ml   Less chest drainage 2+ right radial pulse Still hoarse  Assessment/Planning: S/p aorto carotid subclavian bypass continues to improve daily Will have ENT eval cords if hoarseness not resolved in 6 weeks Continue mobilization.  Hopefully home next few days.  [name] 01/03/2018 11:50 AM --  Laboratory Lab Results: Recent Labs    01/01/18 0228 01/02/18 0642  WBC 9.3 12.3*  HGB 9.4* 9.6*  HCT 28.8* 29.7*  PLT 281 283   BMET Recent Labs    01/02/18 0642 01/03/18 0241  NA 134* 133*  K 3.2* 4.0  CL 92* 91*  CO2 33* 30  GLUCOSE 123* 104*  BUN 11 11  CREATININE 0.63 0.60  CALCIUM 8.7* 8.6*    COAG Lab Results  Component Value Date   INR 1.12 01/03/2018   INR 1.00 12/22/2017   INR 1.40 12/14/2016   No results found for: PTT

## 2018-01-03 NOTE — Progress Notes (Addendum)
      [address]       [place] [address]             [phone]        8 Days Post-Op Procedure(s) (LRB): REDO STERNOTOMY (N/A) AORTIC TO RIGHT COMMON CAROTID AND RIGHT SUBCLAVIAN  ARTERY  BYPASS (Right) REPAIR OF DIAPHRAGM  Subjective: Patient just returned from x ray. She is starting to eat breakfast. Patient remains with hoarsenss  Objective: Vital signs in last 24 hours: Temp:  [98.1 F (36.7 C)-98.9 F (37.2 C)] 98.8 F (37.1 C) (11/19 0309) Pulse Rate:  [103-118] 112 (11/19 0717) Cardiac Rhythm: Sinus tachycardia (11/19 0700) Resp:  [17-30] 20 (11/19 0717) BP: (111-134)/(51-103) 114/74 (11/19 0309) SpO2:  [92 %-100 %] 97 % (11/19 0717)   Current Weight  01/02/18 83.7 kg       Intake/Output from previous day: 11/18 0701 - 11/19 0700 In: 1433.4 [P.O.:1260; I.V.:173.4] Out: 1003 [Urine:1000; Stool:3]   Physical Exam:  Cardiovascular: Tachycardic Pulmonary: Somewhat coarse breath sounds Abdomen: Soft, non tender, bowel sounds present. Wounds: Sternal wound is clean and dry.  No erythema or signs of infection. Ecchymosis sternal wound. Dressing with small amount of drainage from chest tube wound  Lab Results: CBC: Recent Labs    01/01/18 0228 01/02/18 0642  WBC 9.3 12.3*  HGB 9.4* 9.6*  HCT 28.8* 29.7*  PLT 281 283   BMET:  Recent Labs    01/02/18 0642 01/03/18 0241  NA 134* 133*  K 3.2* 4.0  CL 92* 91*  CO2 33* 30  GLUCOSE 123* 104*  BUN 11 11  CREATININE 0.63 0.60  CALCIUM 8.7* 8.6*    PT/INR:  Lab Results  Component Value Date   INR 1.12 01/03/2018   INR 1.00 12/22/2017   INR 1.40 12/14/2016   ABG:  INR: Will add last result for INR, ABG once components are confirmed Will add last 4 CBG results once components are confirmed  Assessment/Plan:  1. CV - SR. On Lopressor 12.5 mg bid 2.  Pulmonary - History of COPD. On 3 liters of oxygen via [place]. Wean as able. Continue Mucinex for cough, Xopenex tid, and  Incruse Ellipta. CXR this am appears stable 3. Volume Overload - on Lasix 20 mg IV daily 4.  Acute blood loss anemia - H and H yesterday stable at 9.6 and 29.7 5. DVT left IJ, subclavian, and axillary veins-on Heparin drip and Coumadin. INR 1.12. Will see first result of Coumadin in am 6. GI-swallow study this am. On Dysphagia 3 diet  Donielle M ZimmermanPA-C 01/03/2018,7:42 AM [phone]  CXR clear Cont with coumadin load for arm DVT Cont heparin until INR > 1.6 I packed the chest tube site- clean , only 1 cm deep- use saline in 2x2 guaze wet/dry each shift   patient examined and medical record reviewed,agree with above note. Tharon Aquas Trigt III 01/03/2018

## 2018-01-03 NOTE — Progress Notes (Signed)
I asked patient if she uses a bipap or cpap at home to sleep with.  Patient stated that she does not use anything like that at home.  She did not want to use.

## 2018-01-03 NOTE — Progress Notes (Signed)
ANTICOAGULATION CONSULT NOTE  Pharmacy Consult for Heparin  Indication: DVT LUE   No Known Allergies  Patient Measurements: Height: 5\' 4"  (162.6 cm) Weight: 184 lb 8.4 oz (83.7 kg) IBW/kg (Calculated) : 54.7 Heparin Dosing Weight: 60kg  Vital Signs: Temp: 98.8 F (37.1 C) (11/19 0309) Temp Source: Oral (11/19 0309) BP: 114/74 (11/19 0309) Pulse Rate: 112 (11/19 0717)  Labs: Recent Labs    01/01/18 0228 01/01/18 1808 01/02/18 0642 01/03/18 0241  HGB 9.4*  --  9.6*  --   HCT 28.8*  --  29.7*  --   PLT 281  --  283  --   LABPROT  --   --   --  14.3  INR  --   --   --  1.12  HEPARINUNFRC  --  <0.10* <0.10* <0.10*  CREATININE 0.50  --  0.63 0.60    Estimated Creatinine Clearance: 74.4 mL/min (by C-G formula based on SCr of 0.6 mg/dL).   Assessment: 62yof with Hx CAD-CABG 10/18, CVD hx TIAs admitted for redo sternotomy, wedge resection RUL nodule, and aortic to R carotid artery bypass on 11/11.   Noted to have LUE swelling near PICC line and doppler + for DVT. Heparin level has been undetectable x2 draws. Given recent surgery, will keep heparin infusion at 800 units/hr as discussed with TCTS. Plan to initiate warfarin therapy to eventually transition off the infusion.   Heparin level remains undetectable today - will discontinue heparin level daily since no titration to heparin infusion. Hgb 9.6, plt 283 on last check yesterday. No s/sx of bleeding. No infusion issues.     Goal of Therapy:  Heparin level 0.3-0.5 units/ml Monitor platelets by anticoagulation protocol: Yes   Plan:  Continue heparin drip 800 uts/hr Daily HL, CBC Monitor s/s bleeding  Antonietta Jewel, PharmD Clinical Pharmacist  Pager: [phone] Phone: [phone] Please check AMION for all [place] numbers 01/03/2018 7:28 AM

## 2018-01-03 NOTE — Plan of Care (Signed)
Problem: Education: Goal: Knowledge of General Education information will improve Description Including pain rating scale, medication(s)/side effects and non-pharmacologic comfort measures 01/03/2018 2034 by Brand Males, RN Outcome: Progressing 01/03/2018 2026 by Brand Males, RN Outcome: Progressing   Problem: Health Behavior/Discharge Planning: Goal: Ability to manage health-related needs will improve 01/03/2018 2034 by Brand Males, RN Outcome: Progressing 01/03/2018 2026 by Brand Males, RN Outcome: Progressing   Problem: Clinical Measurements: Goal: Ability to maintain clinical measurements within normal limits will improve 01/03/2018 2034 by Brand Males, RN Outcome: Progressing 01/03/2018 2026 by Brand Males, RN Outcome: Progressing Goal: Will remain free from infection 01/03/2018 2034 by Brand Males, RN Outcome: Progressing 01/03/2018 2026 by Brand Males, RN Outcome: Progressing Goal: Diagnostic test results will improve 01/03/2018 2034 by Brand Males, RN Outcome: Progressing 01/03/2018 2026 by Brand Males, RN Outcome: Progressing Goal: Respiratory complications will improve 01/03/2018 2034 by Brand Males, RN Outcome: Progressing 01/03/2018 2026 by Brand Males, RN Outcome: Progressing Goal: Cardiovascular complication will be avoided 01/03/2018 2034 by Brand Males, RN Outcome: Progressing 01/03/2018 2026 by Brand Males, RN Outcome: Progressing   Problem: Activity: Goal: Risk for activity intolerance will decrease 01/03/2018 2034 by Brand Males, RN Outcome: Progressing 01/03/2018 2026 by Brand Males, RN Outcome: Progressing   Problem: Nutrition: Goal: Adequate nutrition will be maintained 01/03/2018 2034 by Brand Males, RN Outcome: Progressing 01/03/2018 2026 by Brand Males, RN Outcome: Progressing   Problem: Coping: Goal: Level of anxiety will decrease 01/03/2018  2034 by Brand Males, RN Outcome: Progressing 01/03/2018 2026 by Brand Males, RN Outcome: Progressing   Problem: Elimination: Goal: Will not experience complications related to bowel motility 01/03/2018 2034 by Brand Males, RN Outcome: Progressing 01/03/2018 2026 by Brand Males, RN Outcome: Progressing Goal: Will not experience complications related to urinary retention 01/03/2018 2034 by Brand Males, RN Outcome: Progressing 01/03/2018 2026 by Brand Males, RN Outcome: Progressing   Problem: Pain Managment: Goal: General experience of comfort will improve 01/03/2018 2034 by Brand Males, RN Outcome: Progressing 01/03/2018 2026 by Brand Males, RN Outcome: Progressing   Problem: Safety: Goal: Ability to remain free from injury will improve 01/03/2018 2034 by Brand Males, RN Outcome: Progressing 01/03/2018 2026 by Brand Males, RN Outcome: Progressing   Problem: Skin Integrity: Goal: Risk for impaired skin integrity will decrease 01/03/2018 2034 by Brand Males, RN Outcome: Progressing 01/03/2018 2026 by Brand Males, RN Outcome: Progressing   Problem: Education: Goal: Will demonstrate proper wound care and an understanding of methods to prevent future damage 01/03/2018 2034 by Brand Males, RN Outcome: Progressing 01/03/2018 2026 by Brand Males, RN Outcome: Progressing Goal: Knowledge of disease or condition will improve 01/03/2018 2034 by Brand Males, RN Outcome: Progressing 01/03/2018 2026 by Brand Males, RN Outcome: Progressing Goal: Knowledge of the prescribed therapeutic regimen will improve 01/03/2018 2034 by Brand Males, RN Outcome: Progressing 01/03/2018 2026 by Brand Males, RN Outcome: Progressing Goal: Individualized Educational Video(s) 01/03/2018 2034 by Brand Males, RN Outcome: Progressing 01/03/2018 2026 by Brand Males, RN Outcome: Progressing   Problem:  Activity: Goal: Risk for activity intolerance will decrease 01/03/2018 2034 by Brand Males, RN Outcome: Progressing 01/03/2018 2026 by Brand Males, RN Outcome: Progressing   Problem: Cardiac: Goal: Will achieve and/or maintain hemodynamic stability 01/03/2018 2034 by Brand Males, RN Outcome: Progressing 01/03/2018 2026  by Brand Males, RN Outcome: Progressing   Problem: Clinical Measurements: Goal: Postoperative complications will be avoided or minimized 01/03/2018 2034 by Brand Males, RN Outcome: Progressing 01/03/2018 2026 by Brand Males, RN Outcome: Progressing   Problem: Respiratory: Goal: Respiratory status will improve 01/03/2018 2034 by Brand Males, RN Outcome: Progressing 01/03/2018 2026 by Brand Males, RN Outcome: Progressing   Problem: Skin Integrity: Goal: Wound healing without signs and symptoms of infection 01/03/2018 2034 by Brand Males, RN Outcome: Progressing 01/03/2018 2026 by Brand Males, RN Outcome: Progressing Goal: Risk for impaired skin integrity will decrease 01/03/2018 2034 by Brand Males, RN Outcome: Progressing 01/03/2018 2026 by Brand Males, RN Outcome: Progressing   Problem: Urinary Elimination: Goal: Ability to achieve and maintain adequate renal perfusion and functioning will improve 01/03/2018 2034 by Brand Males, RN Outcome: Progressing 01/03/2018 2026 by Brand Males, RN Outcome: Progressing

## 2018-01-03 NOTE — Plan of Care (Signed)

## 2018-01-03 NOTE — Progress Notes (Signed)
Modified Barium Swallow Progress Note  Patient Details  Name: KIM OKI MRN: [id number] Date of Birth: Oct 19, 1955  Today's Date: 01/03/2018  Modified Barium Swallow completed.  Full report located under Chart Review in the Imaging Section.  Brief recommendations include the following:  Clinical Impression  Pt continues to demonstrate silent penetration and sensed aspiration of thin liquids during the swallow, though nectar thick liquids are consistently tolerated in contrast with prior study. Pt initiates laryngeal elevation, but does not complete hyoid excursion until the bolus has reached the pyriforms. This pushes small amounts of thin liquid into the vestibule prior to contact between the arytenoids and epiglottic surface to seal to laryngeal vestibule. With large boluses of thin this does reasult in aspiration. Positional strategies including head turn all ineffective. Pt did best with cues to take a small sip, hold orally, hold breath/bear down, and swallow, which encourages early laryngeal closure. Suspect that in addition to timing problem there may also be decreased adduction of the right arytenoid for adequate/rapid laryngeal seal given pts hix of surgery and persistent dysphonia. Would encourage pt to f/u with ENT if dysphonia and dysphagia persist to one month. For now, pt to consume regular texture foods, nectar thick liquids with any needed strategies. May have thin water if able to attend and take care sips with the aforementioned strategies. Pt to f/u with home health SLP.    Swallow Evaluation Recommendations       SLP Diet Recommendations: Dysphagia 3 (Mech soft) solids;Nectar thick liquid;Thin liquid   Liquid Administration via: Cup   Medication Administration: Whole meds with liquid   Supervision: Patient able to self feed   Compensations: Slow rate;Small sips/bites;Minimize environmental distractions(Semi Caryl Ada)               Herbie [place], MA [place] Pager [phone] Office [phone]  Lynann Beaver 01/03/2018,11:24 AM

## 2018-01-03 NOTE — Progress Notes (Signed)
Pt left unit in wheelchair, to have swallow test done , accompanied by transporter.

## 2018-01-04 LAB — GLUCOSE, CAPILLARY
Glucose-Capillary: 127 mg/dL — ABNORMAL HIGH (ref 70–99)
Glucose-Capillary: 127 mg/dL — ABNORMAL HIGH (ref 70–99)
Glucose-Capillary: 115 mg/dL — ABNORMAL HIGH (ref 70–99)
Glucose-Capillary: 129 mg/dL — ABNORMAL HIGH (ref 70–99)

## 2018-01-04 LAB — CBC
HCT: 27.6 % — ABNORMAL LOW (ref 36.0–46.0)
Hemoglobin: 8.6 g/dL — ABNORMAL LOW (ref 12.0–15.0)
MCH: 27.9 pg (ref 26.0–34.0)
MCHC: 31.2 g/dL (ref 30.0–36.0)
MCV: 89.6 fL (ref 80.0–100.0)
Platelets: 348 10*3/uL (ref 150–400)
RBC: 3.08 MIL/uL — ABNORMAL LOW (ref 3.87–5.11)
RDW: 13.5 % (ref 11.5–15.5)
WBC: 15.5 10*3/uL — ABNORMAL HIGH (ref 4.0–10.5)
nRBC: 0.1 % (ref 0.0–0.2)

## 2018-01-04 LAB — PROTIME-INR
INR: 1.34
Prothrombin Time: 16.4 seconds — ABNORMAL HIGH (ref 11.4–15.2)

## 2018-01-04 MED ORDER — SODIUM CHLORIDE 0.9 % IV SOLN
INTRAVENOUS | Status: DC | PRN
  Administered 2018-01-04 – 2018-01-07 (×2): 250 mL via INTRAVENOUS
  Administered 2018-01-08: 09:00:00 via INTRAVENOUS

## 2018-01-04 MED ORDER — METOPROLOL TARTRATE 25 MG PO TABS
25.0000 mg | ORAL_TABLET | Freq: Two times a day (BID) | ORAL | Status: DC
  Administered 2018-01-04 – 2018-01-13 (×16): 25 mg via ORAL
  Filled 2018-01-04 (×19): qty 1

## 2018-01-04 MED ORDER — VANCOMYCIN HCL IN DEXTROSE 1-5 GM/200ML-% IV SOLN
1000.0000 mg | Freq: Two times a day (BID) | INTRAVENOUS | Status: DC
  Administered 2018-01-04 – 2018-01-05 (×4): 1000 mg via INTRAVENOUS
  Filled 2018-01-04 (×5): qty 200

## 2018-01-04 MED ORDER — WARFARIN SODIUM 5 MG PO TABS
5.0000 mg | ORAL_TABLET | Freq: Every day | ORAL | Status: DC
  Administered 2018-01-04: 5 mg via ORAL
  Filled 2018-01-04: qty 1

## 2018-01-04 NOTE — Progress Notes (Signed)
  Speech Language Pathology Treatment: Dysphagia  Patient Details Name: Emily Rocha MRN: [id number] DOB: July 16, 1955 Today's Date: 01/04/2018 Time: 7106-2694 SLP Time Calculation (min) (ACUTE ONLY): 12 min  Assessment / Plan / Recommendation Clinical Impression  Pt was seen following MBS on previous date with diet advancement. Pt verbalized her swallow strategies with Mod I and utilized them with Min cues for correct implementation. She had no overt s/s of aspiration. She can also verbalize the rationale for her strategies and risks of aspiration. Further education was provided about treatment plan and recommendation for possible ENT referral if dysphagia and dysphonia were to persist. Would continue current diet (Dys 3, nectar thick liquids), still allowing sips of water with prescribed strategies (hold bolus, hold breath/bear down, then swallow).   HPI HPI: Pt is a 62 year old female presented 12/26/17 for redo sternotomy with aortic to right common carotid and right subclavian artery bypass surgery - intubated for 1 day (11/11-11/12). PMH heavy tobacco use (quit 1 year ago), asthma, GERD, pneumonia, SOB, PVD, stroke. Pt also had CABG x3 1 year ago; was recovering well until presented with 2-3 episodes TIA in Oct 2019. CXR showed mild bibasilar atelectasis slightly increased on right comapred to last exam. Per chart, swallow eval requested due to concern for possible nerve damage from surgery. MBS on 12/28/17 recc D3/Honey.      SLP Plan  Continue with current plan of care       Recommendations  Diet recommendations: Dysphagia 3 (mechanical soft);Nectar-thick liquid;Other(comment)(sips of water with strategies) Liquids provided via: Cup Medication Administration: Whole meds with puree Supervision: Patient able to self feed;Intermittent supervision to cue for compensatory strategies Compensations: Slow rate;Small sips/bites;Minimize environmental distractions(hold liquid, hold breath, then  swallow) Postural Changes and/or Swallow Maneuvers: Seated upright 90 degrees                Oral Care Recommendations: Oral care BID Follow up Recommendations: Home health SLP SLP Visit Diagnosis: Dysphagia, unspecified (R13.10) Plan: Continue with current plan of care       GO                Germain Osgood 01/04/2018, 2:18 PM  Germain Osgood, M.A. [place] Acute Environmental education officer [phone] Office [phone]

## 2018-01-04 NOTE — Progress Notes (Signed)
[facility]  Subjective  - feeling better everyday   Objective (!) 144/109 (!) 107 98.8 F (37.1 C) (Oral) 20 (!) 84%  Intake/Output Summary (Last 24 hours) at 01/04/2018 0859 Last data filed at 01/04/2018 0636 Gross per 24 hour  Intake 642 ml  Output -  Net 642 ml   2+ right radial pulse Neuro intact Chest tube dressing saturated twice yesterday but less overall  Assessment/Planning: S/p aorto carotid subclavian bypass Still with some chest tube drainage Anticoagulation for 3 months for DVT Increasing leukocytosis but afebrile will follow  [name] 01/04/2018 8:59 AM --  Laboratory Lab Results: Recent Labs    01/02/18 0642 01/04/18 0136  WBC 12.3* 15.5*  HGB 9.6* 8.6*  HCT 29.7* 27.6*  PLT 283 348   BMET Recent Labs    01/02/18 0642 01/03/18 0241  NA 134* 133*  K 3.2* 4.0  CL 92* 91*  CO2 33* 30  GLUCOSE 123* 104*  BUN 11 11  CREATININE 0.63 0.60  CALCIUM 8.7* 8.6*    COAG Lab Results  Component Value Date   INR 1.34 01/04/2018   INR 1.12 01/03/2018   INR 1.00 12/22/2017   No results found for: PTT

## 2018-01-04 NOTE — Plan of Care (Signed)
  Problem: Clinical Measurements: Goal: Will remain free from infection Outcome: Progressing Goal: Respiratory complications will improve Outcome: Progressing Goal: Cardiovascular complication will be avoided Outcome: Progressing   Problem: Nutrition: Goal: Adequate nutrition will be maintained Outcome: Progressing   Problem: Pain Managment: Goal: General experience of comfort will improve Outcome: Progressing   Problem: Safety: Goal: Ability to remain free from injury will improve Outcome: Progressing   Problem: Skin Integrity: Goal: Risk for impaired skin integrity will decrease Outcome: Progressing   Problem: Education: Goal: Will demonstrate proper wound care and an understanding of methods to prevent future damage Outcome: Progressing Goal: Knowledge of disease or condition will improve Outcome: Progressing Goal: Knowledge of the prescribed therapeutic regimen will improve Outcome: Progressing Goal: Individualized Educational Video(s) Outcome: Progressing   Problem: Activity: Goal: Risk for activity intolerance will decrease Outcome: Progressing   Problem: Cardiac: Goal: Will achieve and/or maintain hemodynamic stability Outcome: Progressing   Problem: Clinical Measurements: Goal: Postoperative complications will be avoided or minimized Outcome: Progressing   Problem: Respiratory: Goal: Respiratory status will improve Outcome: Progressing   Problem: Skin Integrity: Goal: Wound healing without signs and symptoms of infection Outcome: Progressing Goal: Risk for impaired skin integrity will decrease Outcome: Progressing   Problem: Urinary Elimination: Goal: Ability to achieve and maintain adequate renal perfusion and functioning will improve Outcome: Progressing

## 2018-01-04 NOTE — Plan of Care (Signed)
  Problem: Clinical Measurements: Goal: Will remain free from infection 01/04/2018 0955 by Don Perking, RN Outcome: Progressing 01/04/2018 0942 by Don Perking, RN Outcome: Progressing Goal: Respiratory complications will improve 01/04/2018 0955 by Don Perking, RN Outcome: Progressing 01/04/2018 0942 by Don Perking, RN Outcome: Progressing Goal: Cardiovascular complication will be avoided 01/04/2018 0955 by Don Perking, RN Outcome: Progressing 01/04/2018 0942 by Don Perking, RN Outcome: Progressing   Problem: Nutrition: Goal: Adequate nutrition will be maintained 01/04/2018 0955 by Don Perking, RN Outcome: Progressing 01/04/2018 0942 by Don Perking, RN Outcome: Progressing   Problem: Pain Managment: Goal: General experience of comfort will improve 01/04/2018 0955 by Don Perking, RN Outcome: Progressing 01/04/2018 0942 by Don Perking, RN Outcome: Progressing   Problem: Safety: Goal: Ability to remain free from injury will improve 01/04/2018 0955 by Don Perking, RN Outcome: Progressing 01/04/2018 0942 by Don Perking, RN Outcome: Progressing   Problem: Skin Integrity: Goal: Risk for impaired skin integrity will decrease 01/04/2018 0955 by Don Perking, RN Outcome: Progressing 01/04/2018 0942 by Don Perking, RN Outcome: Progressing   Problem: Education: Goal: Will demonstrate proper wound care and an understanding of methods to prevent future damage Outcome: Progressing Goal: Knowledge of disease or condition will improve Outcome: Progressing Goal: Knowledge of the prescribed therapeutic regimen will improve Outcome: Progressing Goal: Individualized Educational Video(s) Outcome: Progressing   Problem: Activity: Goal: Risk for activity intolerance will decrease Outcome: Progressing   Problem: Cardiac: Goal: Will achieve and/or maintain hemodynamic stability Outcome:  Progressing   Problem: Clinical Measurements: Goal: Postoperative complications will be avoided or minimized Outcome: Progressing   Problem: Respiratory: Goal: Respiratory status will improve Outcome: Progressing   Problem: Skin Integrity: Goal: Wound healing without signs and symptoms of infection Outcome: Progressing Goal: Risk for impaired skin integrity will decrease Outcome: Progressing   Problem: Urinary Elimination: Goal: Ability to achieve and maintain adequate renal perfusion and functioning will improve Outcome: Progressing

## 2018-01-04 NOTE — Progress Notes (Addendum)
Pharmacy Antibiotic Note  Emily Rocha is a 62 y.o. female admitted on 12/26/2017 with cellulitis.  Pharmacy has been consulted for vancomycin dosing.  WBC up to 15.5. Afebrile. Scr 0.6 (CrCl 74 mL/min). Concern for sternal wound for cellulitis.   Plan: Vancomycin 1000 mg IV every 12 hours.  Goal trough 10-15 mcg/mL.  Monitor renal fx, clinical pic, cx results, and VT as appropriate  Continue heparin infusion at 800 units/hr without titration as per conversation with TCTS  Height: 5\' 4"  (162.6 cm) Weight: 184 lb 8.4 oz (83.7 kg) IBW/kg (Calculated) : 54.7  Temp (24hrs), Avg:98.3 F (36.8 C), Min:97.4 F (36.3 C), Max:98.8 F (37.1 C)  Recent Labs  Lab 12/30/17 0410 12/31/17 0620 01/01/18 0228 01/02/18 0642 01/03/18 0241 01/04/18 0136  WBC 7.6 6.5 9.3 12.3*  --  15.5*  CREATININE 0.67 0.60 0.50 0.63 0.60  --     Estimated Creatinine Clearance: 74.4 mL/min (by C-G formula based on SCr of 0.6 mg/dL).    No Known Allergies  Antimicrobials this admission: Peri-op abx Vancomycin 11/20 >>   Dose adjustments this admission: N/A  Microbiology results: 11/14 Wound Cx: no orgs  Thank you for allowing pharmacy to be a part of this patient's care.  Antonietta Jewel, PharmD Clinical Pharmacist  Pager: [phone] Phone: [phone] 01/04/2018 10:22 AM

## 2018-01-04 NOTE — Plan of Care (Signed)
  Problem: Clinical Measurements: Goal: Will remain free from infection Outcome: Progressing Goal: Respiratory complications will improve Outcome: Progressing Goal: Cardiovascular complication will be avoided Outcome: Progressing   Problem: Nutrition: Goal: Adequate nutrition will be maintained Outcome: Progressing   Problem: Pain Managment: Goal: General experience of comfort will improve Outcome: Progressing   Problem: Safety: Goal: Ability to remain free from injury will improve Outcome: Progressing   Problem: Skin Integrity: Goal: Risk for impaired skin integrity will decrease Outcome: Progressing

## 2018-01-04 NOTE — Progress Notes (Addendum)
      [address]       [place] [address]             [phone]        9 Days Post-Op Procedure(s) (LRB): REDO STERNOTOMY (N/A) AORTIC TO RIGHT COMMON CAROTID AND RIGHT SUBCLAVIAN  ARTERY  BYPASS (Right) REPAIR OF DIAPHRAGM  Subjective: Patient in good spirits this am. Mucinex helping expectorate. Patient remains with hoarsenss  Objective: Vital signs in last 24 hours: Temp:  [97.4 F (36.3 C)-98.5 F (36.9 C)] 97.4 F (36.3 C) (11/20 0405) Pulse Rate:  [105-115] 106 (11/20 0405) Cardiac Rhythm: Sinus tachycardia (11/20 0700) Resp:  [25-33] 33 (11/20 0405) BP: (107-136)/(59-86) 135/62 (11/20 0405) SpO2:  [96 %-100 %] 100 % (11/20 0716)   Current Weight  01/02/18 83.7 kg       Intake/Output from previous day: 11/19 0701 - 11/20 0700 In: 642 [P.O.:440; I.V.:202] Out: -    Physical Exam:  Cardiovascular: Tachycardic Pulmonary: Slightly diminished at bases Abdomen: Soft, non tender, bowel sounds present. Wounds: Sternal wound is clean and dry.  There is slight erythema mid sterna wound but no drainage.  Dressing dry  from chest tube wound  Lab Results: CBC: Recent Labs    01/02/18 0642 01/04/18 0136  WBC 12.3* 15.5*  HGB 9.6* 8.6*  HCT 29.7* 27.6*  PLT 283 348   BMET:  Recent Labs    01/02/18 0642 01/03/18 0241  NA 134* 133*  K 3.2* 4.0  CL 92* 91*  CO2 33* 30  GLUCOSE 123* 104*  BUN 11 11  CREATININE 0.63 0.60  CALCIUM 8.7* 8.6*    PT/INR:  Lab Results  Component Value Date   INR 1.34 01/04/2018   INR 1.12 01/03/2018   INR 1.00 12/22/2017   ABG:  INR: Will add last result for INR, ABG once components are confirmed Will add last 4 CBG results once components are confirmed  Assessment/Plan:  1. CV - ST in the low 100's. On Lopressor 12.5 mg bid so will increase to 25 mg bid. 2.  Pulmonary - History of COPD. On 2 liters of oxygen via [place]. May need oxygen at discharge. Continue Mucinex for cough, Xopenex tid, and  Incruse Ellipta.  3. Volume Overload - on Lasix 40 mg daily 4.  Acute blood loss anemia - H and H yesterday stable at 8.6 and 27.6 5. DVT left IJ, subclavian, and axillary veins-on Heparin drip and Coumadin. INR this am 1.34. Continue with Coumadin 5 mg. Heparin to continue until INR at least 1.6 (per Dr. Prescott Gum) 6. GI-swallow study done yesterday recommended Dysphagia 3 (mechanical soft diet)  Emily M ZimmermanPA-C 01/04/2018,7:50 AM [phone]  Chest tube site packed w/ 2x2 wet/dry- clean, granulating. Sternal wound with cellulitis- vancomycin started Swallow fx improved Increasing coumadin dose  patient examined and medical record reviewed,agree with above note. Emily Rocha 01/04/2018

## 2018-01-05 LAB — GLUCOSE, CAPILLARY
Glucose-Capillary: 124 mg/dL — ABNORMAL HIGH (ref 70–99)
Glucose-Capillary: 124 mg/dL — ABNORMAL HIGH (ref 70–99)
Glucose-Capillary: 94 mg/dL (ref 70–99)
Glucose-Capillary: 156 mg/dL — ABNORMAL HIGH (ref 70–99)

## 2018-01-05 LAB — CBC
HCT: 27.2 % — ABNORMAL LOW (ref 36.0–46.0)
Hemoglobin: 8.8 g/dL — ABNORMAL LOW (ref 12.0–15.0)
MCH: 28.9 pg (ref 26.0–34.0)
MCHC: 32.4 g/dL (ref 30.0–36.0)
MCV: 89.5 fL (ref 80.0–100.0)
Platelets: 315 10*3/uL (ref 150–400)
RBC: 3.04 MIL/uL — ABNORMAL LOW (ref 3.87–5.11)
RDW: 13.6 % (ref 11.5–15.5)
WBC: 14.1 10*3/uL — ABNORMAL HIGH (ref 4.0–10.5)
nRBC: 0.1 % (ref 0.0–0.2)

## 2018-01-05 LAB — PREPARE RBC (CROSSMATCH)

## 2018-01-05 LAB — PROTIME-INR
INR: 1.83
Prothrombin Time: 20.9 seconds — ABNORMAL HIGH (ref 11.4–15.2)

## 2018-01-05 MED ORDER — WARFARIN SODIUM 2.5 MG PO TABS
2.5000 mg | ORAL_TABLET | Freq: Every day | ORAL | Status: DC
  Administered 2018-01-05: 2.5 mg via ORAL
  Filled 2018-01-05: qty 1

## 2018-01-05 NOTE — Progress Notes (Signed)
[facility]  Subjective  - feels ok, incision is a little sore   Objective (!) 111/91 98 98.3 F (36.8 C) (Oral) (!) 22 98%  Intake/Output Summary (Last 24 hours) at 01/05/2018 0747 Last data filed at 01/04/2018 2211 Gross per 24 hour  Intake 1018.54 ml  Output -  Net 1018.54 ml   Erythema sternal incision with some warmth no drainage 2+ right radial pulse Neuro intact Left arm swelling improved  Assessment/Planning: Leukocytosis slightly improved on Vanc No real chest tube drainage at this point Obvious concern at this point is sternal wound infection especially with prosthetic graft, otherwise she looks good  Will defer antibiotics vs I and D to Dr Prescott Gum Continue anticoagulation for left arm DVT  Emily Rocha 01/05/2018 7:47 AM --  Laboratory Lab Results: Recent Labs    01/04/18 0136 01/05/18 0237  WBC 15.5* 14.1*  HGB 8.6* 8.8*  HCT 27.6* 27.2*  PLT 348 315   BMET Recent Labs    01/03/18 0241  NA 133*  K 4.0  CL 91*  CO2 30  GLUCOSE 104*  BUN 11  CREATININE 0.60  CALCIUM 8.6*    COAG Lab Results  Component Value Date   INR 1.83 01/05/2018   INR 1.34 01/04/2018   INR 1.12 01/03/2018   No results found for: PTT

## 2018-01-05 NOTE — Progress Notes (Addendum)
      [address]       ,[place] [address]             [phone]        10 Days Post-Op Procedure(s) (LRB): REDO STERNOTOMY (N/A) AORTIC TO RIGHT COMMON CAROTID AND RIGHT SUBCLAVIAN  ARTERY  BYPASS (Right) REPAIR OF DIAPHRAGM  Subjective: Patient remains with hoarsenss  Objective: Vital signs in last 24 hours: Temp:  [97.5 F (36.4 C)-98.8 F (37.1 C)] 98.8 F (37.1 C) (11/21 0339) Pulse Rate:  [99-113] 106 (11/20 2202) Cardiac Rhythm: Sinus tachycardia (11/21 0700) Resp:  [17-36] 17 (11/21 0339) BP: (109-150)/(67-109) 109/67 (11/21 0339) SpO2:  [84 %-100 %] 100 % (11/20 1920)   Current Weight  01/02/18 83.7 kg       Intake/Output from previous day: 11/20 0701 - 11/21 0700 In: 1018.5 [P.O.:720; I.V.:98.5; IV Piggyback:200] Out: -    Physical Exam:  Cardiovascular: Tachycardic Pulmonary: Slightly diminished at bases Abdomen: Soft, non tender, bowel sounds present. Wounds: Sternal wound is clean and dry.  There is  erythema mid sternal wound but no drainage. There appears to be fluctulance along mid sternal wound.  Chest tube wound is clean and dry.  Lab Results: CBC: Recent Labs    01/04/18 0136 01/05/18 0237  WBC 15.5* 14.1*  HGB 8.6* 8.8*  HCT 27.6* 27.2*  PLT 348 315   BMET:  Recent Labs    01/03/18 0241  NA 133*  K 4.0  CL 91*  CO2 30  GLUCOSE 104*  BUN 11  CREATININE 0.60  CALCIUM 8.6*    PT/INR:  Lab Results  Component Value Date   INR 1.83 01/05/2018   INR 1.34 01/04/2018   INR 1.12 01/03/2018   ABG:  INR: Will add last result for INR, ABG once components are confirmed Will add last 4 CBG results once components are confirmed  Assessment/Plan:  1. CV - Some ST in the low 100's. SR in the 90's this am. On Lopressor 25 mg bid. 2.  Pulmonary - History of COPD. On 2 liters of oxygen via [place]. May need oxygen at discharge. Continue Mucinex for cough, Xopenex tid, and Incruse Ellipta.  3. Volume Overload - on  Lasix 40 mg daily 4.  Acute blood loss anemia - H and H yesterday stable at 8.6 and 27.6 5. DVT left IJ, subclavian, and axillary veins-on Heparin drip and Coumadin. INR this am 1.83. Continue with Coumadin 2.5 mg. Stop Heparin drip 6. GI-swallow study done yesterday recommended Dysphagia 3 (mechanical soft diet)Nectar-thick liquid;Other(comment)(sips of water with strategies) 7. ID-on Vanco for sternal wound cellulitis Continue antibiotic for now but may need I and D  Donielle M ZimmermanPA-C 01/05/2018,7:28 AM [phone]  The patient has a subcutaneous space with erythema and probable fluid collection.  She is probably disrupted her fascial closure with her constant heavy coughing from her COPD.  I have recommended to the patient that we drain the superficial incision, irrigate and place a wound VAC under anesthesia tomorrow a.m.  She agrees to proceed.  Her INR is mildly elevated but would not preclude a wound incision and drainage.  Dahlia Byes MD

## 2018-01-05 NOTE — Plan of Care (Signed)
  Problem: Clinical Measurements: Goal: Will remain free from infection Outcome: Progressing Goal: Respiratory complications will improve Outcome: Progressing Goal: Cardiovascular complication will be avoided Outcome: Progressing   Problem: Nutrition: Goal: Adequate nutrition will be maintained Outcome: Progressing   Problem: Pain Managment: Goal: General experience of comfort will improve Outcome: Progressing   Problem: Safety: Goal: Ability to remain free from injury will improve Outcome: Progressing   Problem: Skin Integrity: Goal: Risk for impaired skin integrity will decrease Outcome: Progressing   Problem: Education: Goal: Will demonstrate proper wound care and an understanding of methods to prevent future damage Outcome: Progressing Goal: Knowledge of disease or condition will improve Outcome: Progressing Goal: Knowledge of the prescribed therapeutic regimen will improve Outcome: Progressing Goal: Individualized Educational Video(s) Outcome: Progressing   Problem: Activity: Goal: Risk for activity intolerance will decrease Outcome: Progressing   Problem: Cardiac: Goal: Will achieve and/or maintain hemodynamic stability Outcome: Progressing   Problem: Clinical Measurements: Goal: Postoperative complications will be avoided or minimized Outcome: Progressing   Problem: Respiratory: Goal: Respiratory status will improve Outcome: Progressing   Problem: Skin Integrity: Goal: Wound healing without signs and symptoms of infection Outcome: Progressing Goal: Risk for impaired skin integrity will decrease Outcome: Progressing   Problem: Urinary Elimination: Goal: Ability to achieve and maintain adequate renal perfusion and functioning will improve Outcome: Progressing

## 2018-01-06 ENCOUNTER — Inpatient Hospital Stay (HOSPITAL_COMMUNITY): Payer: BLUE CROSS/BLUE SHIELD | Admitting: Certified Registered Nurse Anesthetist

## 2018-01-06 ENCOUNTER — Inpatient Hospital Stay (HOSPITAL_COMMUNITY): Payer: BLUE CROSS/BLUE SHIELD

## 2018-01-06 ENCOUNTER — Encounter (HOSPITAL_COMMUNITY): Payer: Self-pay | Admitting: Certified Registered Nurse Anesthetist

## 2018-01-06 ENCOUNTER — Encounter (HOSPITAL_COMMUNITY)
Admission: RE | Disposition: A | Discharge status: Home Health | Payer: Self-pay | Source: Home / Self Care | Attending: Cardiothoracic Surgery

## 2018-01-06 DIAGNOSIS — L089 Local infection of the skin and subcutaneous tissue, unspecified: Secondary | ICD-10-CM

## 2018-01-06 HISTORY — PX: APPLICATION OF WOUND VAC: SHX5189

## 2018-01-06 HISTORY — PX: STERNAL WOUND DEBRIDEMENT: SHX1058

## 2018-01-06 LAB — GLUCOSE, CAPILLARY
Glucose-Capillary: 119 mg/dL — ABNORMAL HIGH (ref 70–99)
Glucose-Capillary: 160 mg/dL — ABNORMAL HIGH (ref 70–99)
Glucose-Capillary: 198 mg/dL — ABNORMAL HIGH (ref 70–99)

## 2018-01-06 LAB — CBC
HCT: 25.4 % — ABNORMAL LOW (ref 36.0–46.0)
Hemoglobin: 8.2 g/dL — ABNORMAL LOW (ref 12.0–15.0)
MCH: 29 pg (ref 26.0–34.0)
MCHC: 32.3 g/dL (ref 30.0–36.0)
MCV: 89.8 fL (ref 80.0–100.0)
Platelets: 303 10*3/uL (ref 150–400)
RBC: 2.83 MIL/uL — ABNORMAL LOW (ref 3.87–5.11)
RDW: 13.8 % (ref 11.5–15.5)
WBC: 13.8 10*3/uL — ABNORMAL HIGH (ref 4.0–10.5)
nRBC: 0 % (ref 0.0–0.2)

## 2018-01-06 LAB — PROTIME-INR
INR: 1.96
Prothrombin Time: 22.1 seconds — ABNORMAL HIGH (ref 11.4–15.2)

## 2018-01-06 SURGERY — DEBRIDEMENT, WOUND, STERNUM
Anesthesia: General | Site: Chest

## 2018-01-06 MED ORDER — LACTATED RINGERS IV SOLN
INTRAVENOUS | Status: DC | PRN
  Administered 2018-01-06: 08:00:00 via INTRAVENOUS

## 2018-01-06 MED ORDER — SUGAMMADEX SODIUM 200 MG/2ML IV SOLN
INTRAVENOUS | Status: AC
  Filled 2018-01-06: qty 2

## 2018-01-06 MED ORDER — EPHEDRINE SULFATE-NACL 50-0.9 MG/10ML-% IV SOSY
PREFILLED_SYRINGE | INTRAVENOUS | Status: DC | PRN
  Administered 2018-01-06 (×2): 5 mg via INTRAVENOUS

## 2018-01-06 MED ORDER — FENTANYL CITRATE (PF) 100 MCG/2ML IJ SOLN: 25.0000 ug | INTRAMUSCULAR | Status: DC | PRN

## 2018-01-06 MED ORDER — LIDOCAINE 2% (20 MG/ML) 5 ML SYRINGE
INTRAMUSCULAR | Status: DC | PRN
  Administered 2018-01-06: 50 mg via INTRAVENOUS

## 2018-01-06 MED ORDER — PHENYLEPHRINE 40 MCG/ML (10ML) SYRINGE FOR IV PUSH (FOR BLOOD PRESSURE SUPPORT)
PREFILLED_SYRINGE | INTRAVENOUS | Status: DC | PRN
  Administered 2018-01-06: 40 ug via INTRAVENOUS
  Administered 2018-01-06 (×2): 80 ug via INTRAVENOUS
  Administered 2018-01-06: 40 ug via INTRAVENOUS
  Administered 2018-01-06 (×2): 80 ug via INTRAVENOUS

## 2018-01-06 MED ORDER — LIDOCAINE 2% (20 MG/ML) 5 ML SYRINGE
INTRAMUSCULAR | Status: AC
  Filled 2018-01-06: qty 5

## 2018-01-06 MED ORDER — LACTATED RINGERS IV SOLN: INTRAVENOUS | Status: DC

## 2018-01-06 MED ORDER — LACTATED RINGERS IV SOLN
INTRAVENOUS | Status: DC | PRN
  Administered 2018-01-06: 07:00:00 via INTRAVENOUS

## 2018-01-06 MED ORDER — WARFARIN SODIUM 2 MG PO TABS
2.0000 mg | ORAL_TABLET | Freq: Every day | ORAL | Status: DC
  Administered 2018-01-07 – 2018-01-08 (×2): 2 mg via ORAL
  Filled 2018-01-06 (×2): qty 1

## 2018-01-06 MED ORDER — SUGAMMADEX SODIUM 200 MG/2ML IV SOLN
INTRAVENOUS | Status: DC | PRN
  Administered 2018-01-06: 175 mg via INTRAVENOUS

## 2018-01-06 MED ORDER — SODIUM CHLORIDE 0.9 % IV SOLN
INTRAVENOUS | Status: AC
  Filled 2018-01-06: qty 500000

## 2018-01-06 MED ORDER — MEPERIDINE HCL 50 MG/ML IJ SOLN: 6.2500 mg | INTRAMUSCULAR | Status: DC | PRN

## 2018-01-06 MED ORDER — PHENYLEPHRINE 40 MCG/ML (10ML) SYRINGE FOR IV PUSH (FOR BLOOD PRESSURE SUPPORT)
PREFILLED_SYRINGE | INTRAVENOUS | Status: AC
  Filled 2018-01-06: qty 10

## 2018-01-06 MED ORDER — ROCURONIUM BROMIDE 50 MG/5ML IV SOSY
PREFILLED_SYRINGE | INTRAVENOUS | Status: AC
  Filled 2018-01-06: qty 5

## 2018-01-06 MED ORDER — ONDANSETRON HCL 4 MG/2ML IJ SOLN
INTRAMUSCULAR | Status: DC | PRN
  Administered 2018-01-06: 4 mg via INTRAVENOUS

## 2018-01-06 MED ORDER — VANCOMYCIN HCL IN DEXTROSE 1-5 GM/200ML-% IV SOLN
1000.0000 mg | Freq: Two times a day (BID) | INTRAVENOUS | Status: DC
  Administered 2018-01-07 – 2018-01-13 (×14): 1000 mg via INTRAVENOUS
  Filled 2018-01-06 (×16): qty 200

## 2018-01-06 MED ORDER — ROCURONIUM BROMIDE 10 MG/ML (PF) SYRINGE
PREFILLED_SYRINGE | INTRAVENOUS | Status: DC | PRN
  Administered 2018-01-06: 50 mg via INTRAVENOUS

## 2018-01-06 MED ORDER — PROMETHAZINE HCL 25 MG/ML IJ SOLN: 6.2500 mg | INTRAMUSCULAR | Status: DC | PRN

## 2018-01-06 MED ORDER — MIDAZOLAM HCL 2 MG/2ML IJ SOLN
INTRAMUSCULAR | Status: AC
  Filled 2018-01-06: qty 2

## 2018-01-06 MED ORDER — PROPOFOL 10 MG/ML IV BOLUS
INTRAVENOUS | Status: DC | PRN
  Administered 2018-01-06: 90 mg via INTRAVENOUS

## 2018-01-06 MED ORDER — DEXAMETHASONE SODIUM PHOSPHATE 10 MG/ML IJ SOLN
INTRAMUSCULAR | Status: AC
  Filled 2018-01-06: qty 1

## 2018-01-06 MED ORDER — PROPOFOL 10 MG/ML IV BOLUS
INTRAVENOUS | Status: AC
  Filled 2018-01-06: qty 40

## 2018-01-06 MED ORDER — ONDANSETRON HCL 4 MG/2ML IJ SOLN
INTRAMUSCULAR | Status: AC
  Filled 2018-01-06: qty 2

## 2018-01-06 MED ORDER — VANCOMYCIN HCL 1000 MG IV SOLR
INTRAVENOUS | Status: DC
  Filled 2018-01-06: qty 1000

## 2018-01-06 MED ORDER — ALBUTEROL SULFATE HFA 108 (90 BASE) MCG/ACT IN AERS
INHALATION_SPRAY | RESPIRATORY_TRACT | Status: AC
  Filled 2018-01-06: qty 6.7

## 2018-01-06 MED ORDER — SODIUM CHLORIDE 0.9 % IV SOLN
1.0000 g | INTRAVENOUS | Status: AC
  Administered 2018-01-06 – 2018-01-10 (×5): 1 g via INTRAVENOUS
  Filled 2018-01-06 (×5): qty 10

## 2018-01-06 MED ORDER — VANCOMYCIN HCL 1000 MG IV SOLR
INTRAVENOUS | Status: DC | PRN
  Administered 2018-01-06: 08:00:00

## 2018-01-06 MED ORDER — EPHEDRINE 5 MG/ML INJ
INTRAVENOUS | Status: AC
  Filled 2018-01-06: qty 10

## 2018-01-06 MED ORDER — FENTANYL CITRATE (PF) 250 MCG/5ML IJ SOLN
INTRAMUSCULAR | Status: DC | PRN
  Administered 2018-01-06: 25 ug via INTRAVENOUS

## 2018-01-06 MED ORDER — FENTANYL CITRATE (PF) 250 MCG/5ML IJ SOLN
INTRAMUSCULAR | Status: AC
  Filled 2018-01-06: qty 5

## 2018-01-06 MED ORDER — SODIUM CHLORIDE 0.9 % IR SOLN
Status: DC | PRN
  Administered 2018-01-06: 2000 mL

## 2018-01-06 MED ORDER — ALBUTEROL SULFATE HFA 108 (90 BASE) MCG/ACT IN AERS
INHALATION_SPRAY | RESPIRATORY_TRACT | Status: DC | PRN
  Administered 2018-01-06: 4 via RESPIRATORY_TRACT

## 2018-01-06 MED ORDER — MIDAZOLAM HCL 5 MG/5ML IJ SOLN
INTRAMUSCULAR | Status: DC | PRN
  Administered 2018-01-06: 1 mg via INTRAVENOUS

## 2018-01-06 MED ORDER — DEXAMETHASONE SODIUM PHOSPHATE 10 MG/ML IJ SOLN
INTRAMUSCULAR | Status: DC | PRN
  Administered 2018-01-06: 10 mg via INTRAVENOUS

## 2018-01-06 SURGICAL SUPPLY — 53 items
BAG DECANTER FOR FLEXI CONT (MISCELLANEOUS) ×2 IMPLANT
BENZOIN TINCTURE PRP APPL 2/3 (GAUZE/BANDAGES/DRESSINGS) IMPLANT
BLADE SURG 10 STRL SS (BLADE) ×2 IMPLANT
BLADE SURG 15 STRL LF DISP TIS (BLADE) IMPLANT
BLADE SURG 15 STRL SS (BLADE)
CANISTER SUCT 3000ML PPV (MISCELLANEOUS) ×2 IMPLANT
CLIP VESOCCLUDE SM WIDE 24/CT (CLIP) IMPLANT
COVER SURGICAL LIGHT HANDLE (MISCELLANEOUS) ×2 IMPLANT
COVER WAND RF STERILE (DRAPES) ×2 IMPLANT
DRAPE LAPAROSCOPIC ABDOMINAL (DRAPES) ×2 IMPLANT
DRAPE WARM FLUID 44X44 (DRAPE) IMPLANT
DRESSING PEEL AND PLC PRVNA 13 (GAUZE/BANDAGES/DRESSINGS) ×1 IMPLANT
DRSG CUTIMED SORBACT 7X9 (GAUZE/BANDAGES/DRESSINGS) ×2 IMPLANT
DRSG PEEL AND PLACE PREVENA 13 (GAUZE/BANDAGES/DRESSINGS) ×2
ELECT REM PT RETURN 9FT ADLT (ELECTROSURGICAL) ×2
ELECTRODE REM PT RTRN 9FT ADLT (ELECTROSURGICAL) ×1 IMPLANT
GAUZE SPONGE 2X2 8PLY STRL LF (GAUZE/BANDAGES/DRESSINGS) ×1 IMPLANT
GLOVE BIO SURGEON STRL SZ7.5 (GLOVE) ×2 IMPLANT
GLOVE BIOGEL PI IND STRL 6.5 (GLOVE) ×1 IMPLANT
GLOVE BIOGEL PI IND STRL 7.0 (GLOVE) ×1 IMPLANT
GLOVE BIOGEL PI INDICATOR 6.5 (GLOVE) ×1
GLOVE BIOGEL PI INDICATOR 7.0 (GLOVE) ×1
GOWN STRL REUS W/ TWL LRG LVL3 (GOWN DISPOSABLE) ×2 IMPLANT
GOWN STRL REUS W/TWL LRG LVL3 (GOWN DISPOSABLE) ×2
KIT BASIN OR (CUSTOM PROCEDURE TRAY) ×2 IMPLANT
KIT TURNOVER KIT B (KITS) ×2 IMPLANT
MATRIX WOUND 3-LAYER 7X10 (Tissue) ×2 IMPLANT
MICROMATRIX 500MG (Tissue) ×2 IMPLANT
NS IRRIG 1000ML POUR BTL (IV SOLUTION) ×4 IMPLANT
PACK GENERAL/GYN (CUSTOM PROCEDURE TRAY) ×2 IMPLANT
PAD ARMBOARD 7.5X6 YLW CONV (MISCELLANEOUS) ×4 IMPLANT
SOL PREP POV-IOD 4OZ 10% (MISCELLANEOUS) IMPLANT
SOLUTION PARTIC MCRMTRX 500MG (Tissue) ×1 IMPLANT
SPONGE GAUZE 2X2 STER 10/PKG (GAUZE/BANDAGES/DRESSINGS) ×1
SPONGE LAP 18X18 X RAY DECT (DISPOSABLE) ×2 IMPLANT
SURGILUBE 2OZ TUBE FLIPTOP (MISCELLANEOUS) ×2 IMPLANT
SUT ETHILON 3 0 FSL (SUTURE) IMPLANT
SUT STEEL 6MS V (SUTURE) IMPLANT
SUT STEEL STERNAL CCS#1 18IN (SUTURE) IMPLANT
SUT STEEL SZ 6 DBL 3X14 BALL (SUTURE) IMPLANT
SUT VIC AB 1 CTX 36 (SUTURE)
SUT VIC AB 1 CTX36XBRD ANBCTR (SUTURE) IMPLANT
SUT VIC AB 2-0 CT1 18 (SUTURE) ×2 IMPLANT
SUT VIC AB 2-0 CTX 27 (SUTURE) IMPLANT
SUT VIC AB 3-0 SH 18 (SUTURE) ×2 IMPLANT
SUT VIC AB 3-0 X1 27 (SUTURE) IMPLANT
SWAB COLLECTION DEVICE MRSA (MISCELLANEOUS) ×2 IMPLANT
SWAB CULTURE ESWAB REG 1ML (MISCELLANEOUS) IMPLANT
TAPE CLOTH SURG 4X10 WHT LF (GAUZE/BANDAGES/DRESSINGS) ×2 IMPLANT
TOWEL GREEN STERILE (TOWEL DISPOSABLE) ×2 IMPLANT
TOWEL GREEN STERILE FF (TOWEL DISPOSABLE) ×2 IMPLANT
WATER STERILE IRR 1000ML POUR (IV SOLUTION) ×2 IMPLANT
WND VAC CANISTER 500ML (MISCELLANEOUS) ×2 IMPLANT

## 2018-01-06 NOTE — Anesthesia Procedure Notes (Signed)
Central Venous Catheter Insertion Performed by: Effie Berkshire, MD, anesthesiologist Start/End11/22/2019 7:50 AM, 01/06/2018 8:00 AM Patient location: Pre-op. Preanesthetic checklist: patient identified, IV checked, site marked, risks and benefits discussed, surgical consent, monitors and equipment checked, pre-op evaluation, timeout performed and anesthesia consent Position: Trendelenburg Lidocaine 1% used for infiltration and patient sedated Hand hygiene performed , maximum sterile barriers used  and Seldinger technique used Catheter size: 8 Fr Total catheter length 16. Central line was placed.Double lumen Procedure performed using ultrasound guided technique. Ultrasound Notes:anatomy identified, needle tip was noted to be adjacent to the nerve/plexus identified, no ultrasound evidence of intravascular and/or intraneural injection and image(s) printed for medical record Attempts: 1 Following insertion, dressing applied, line sutured and Biopatch. Post procedure assessment: blood return through all ports  Patient tolerated the procedure well with no immediate complications.

## 2018-01-06 NOTE — Brief Op Note (Addendum)
12/26/2017 - 01/06/2018  8:55 AM  PATIENT:  Emily Rocha  62 y.o. female  PRE-OPERATIVE DIAGNOSIS: Superficial  STERNAL WOUND INFECTION  POST-OPERATIVE DIAGNOSIS: Superficial STERNAL WOUND INFECTION  PROCEDURE:  Procedure(s): STERNAL WOUND DEBRIDEMENT (N/A) APPLICATION OF WOUND VAC (N/A) Wound irrigation, excisional superficial wound debridement SURGEON:  Surgeon(s) and Role:    Ivin Poot, MD - Primary  PHYSICIAN ASSISTANT: 0  ASSISTANTS: none   ANESTHESIA:   general  EBL:  25 mL   BLOOD ADMINISTERED:none  DRAINS: none   LOCAL MEDICATIONS USED:  NONE  SPECIMEN:  Aspirate  DISPOSITION OF SPECIMEN:  Microbiology  COUNTS:  YES  TOURNIQUET:  * No tourniquets in log *  DICTATION: .Dragon Dictation  PLAN OF CARE: Return to unit 2 central  PATIENT DISPOSITION:  PACU - hemodynamically stable.   Delay start of Pharmacological VTE agent (>24hrs) due to surgical blood loss or risk of bleeding: yes, hold Coumadin this p.m. then resume November 23  Wound VAC change will be scheduled in 1 week November 30 in the OR  Cont iv Vanco, ceftriaxone pending wound cultures

## 2018-01-06 NOTE — Anesthesia Procedure Notes (Signed)
Procedure Name: Intubation Date/Time: 01/06/2018 7:39 AM Performed by: Colin Benton, CRNA Pre-anesthesia Checklist: Emergency Drugs available, Patient identified, Suction available and Patient being monitored Patient Re-evaluated:Patient Re-evaluated prior to induction Oxygen Delivery Method: Circle system utilized Preoxygenation: Pre-oxygenation with 100% oxygen Induction Type: IV induction Ventilation: Mask ventilation without difficulty and Oral airway inserted - appropriate to patient size Laryngoscope Size: Sabra Heck and 2 Grade View: Grade I Tube type: Oral Tube size: 7.0 mm Number of attempts: 1 Placement Confirmation: ETT inserted through vocal cords under direct vision,  positive ETCO2 and breath sounds checked- equal and bilateral Secured at: 22 cm Tube secured with: Tape Dental Injury: Teeth and Oropharynx as per pre-operative assessment

## 2018-01-06 NOTE — Op Note (Signed)
NAME: Emily Rocha, Emily Rocha MEDICAL RECORD [id number] ACCOUNT [id number] DATE OF BIRTH:05-08-1955 FACILITY: [facility] LOCATION: [facility] PHYSICIAN:Josiyah Tozzi VAN TRIGT III, MD  OPERATIVE REPORT  DATE OF PROCEDURE:  01/06/2018  PROCEDURE:   1.  Superficial sternal wound debridement, irrigation, and application of Prevena wound VAC. 2.  Application of ACell powder and ACell membrane.  SURGEON:  Ivin Poot, MD  PREOPERATIVE DIAGNOSIS:  Superficial sternal wound infection after redo sternotomy for aortic arch vessel reconstruction.  POSTOPERATIVE DIAGNOSIS:  Superficial sternal wound infection after redo sternotomy for aortic arch vessel reconstruction.  ANESTHESIA:  General.  DESCRIPTION OF PROCEDURE:  The patient was brought from preop holding area to the operating room after informed consent was documented and all final questions addressed.  The patient was placed supine on the operating room table and general anesthesia  was achieved.  A central line was placed by the anesthesia team.  The patient was prepped and draped as a sterile field.  A proper timeout time-out was performed.  A small incision was made in the central deep sternal wound where there was erythema and  an area of subcutaneous fluid palpable.  This drained clear serosanguineous fluid, which was sent for culture, aerobic and anaerobic.  The incision was extended to an area of ACell dermal space extending approximately 5 cm, but not involving the entire  incision.  The space had clear serosanguineous fluid and some fat necrosis.  This was irrigated with vancomycin irrigation and some sharp debridement was performed with some necrotic fat which was indurated.  There was no deep extension of the infection  into the fascia.  No sternal wires or sternum was visualized.  After the wound was irrigated and hemostasis was achieved, 500 mg of ACell powder was placed on the wound.  This was covered with an ACell sheet secured to the skin with  3-0 Vicryl sutures.   Over this, a sheet of Sorbact was applied, and then the wound VAC, Prevena sponge and connected to suction.  The patient was then returned to the recovery room after extubation in stable condition.  TN/NUANCE  D:01/06/2018 T:01/06/2018 JOB:[id number]

## 2018-01-06 NOTE — Anesthesia Postprocedure Evaluation (Signed)
Anesthesia Post Note  [name]  Procedure(s) Performed: STERNAL WOUND DEBRIDEMENT (N/A Chest) APPLICATION OF WOUND VAC (N/A Chest)     Patient location during evaluation: PACU Anesthesia Type: General Level of consciousness: awake and alert Pain management: pain level controlled Vital Signs Assessment: post-procedure vital signs reviewed and stable Respiratory status: spontaneous breathing, nonlabored ventilation, respiratory function stable and patient connected to nasal cannula oxygen Cardiovascular status: blood pressure returned to baseline and stable Postop Assessment: no apparent nausea or vomiting Anesthetic complications: no    Last Vitals:  Vitals:   01/06/18 1030 01/06/18 1036  BP:  (!) 108/44  Pulse: 94 96  Resp: 20 (!) 23  Temp: 36.8 C   SpO2: 97% 98%    Last Pain:  Vitals:   01/06/18 0906  TempSrc:   PainSc: 0-No pain                 Effie Berkshire

## 2018-01-06 NOTE — Progress Notes (Signed)
  Speech Language Pathology Treatment: Dysphagia  Patient Details Name: Emily Rocha MRN: [id number] DOB: [birth year] Today's Date: 01/06/2018 Time: 1275-1700 SLP Time Calculation (min) (ACUTE ONLY): 11 min  Assessment / Plan / Recommendation Clinical Impression  Pt was seen after procedure this morning, requiring intubation. Her voice seems similarly dysphonic compared to last SLP visit, and she denies any acute changes as well. Precautions were reviewed from most recent MBS, including diet textures, precautions, and med administration. Pt consumed her pills whole in puree (prefering that over nectar thick liquids) and took sips of water, needing Min cues for adequate use of strategies. Encouraged pt to proceed with caution today especially after her procedure this morning. Will continue to follow acutely.   HPI HPI: Pt is a 62 year old female presented 12/26/17 for redo sternotomy with aortic to right common carotid and right subclavian artery bypass surgery - intubated for 1 day (11/11-11/12). PMH heavy tobacco use (quit 1 year ago), asthma, GERD, pneumonia, SOB, PVD, stroke. Pt also had CABG x3 1 year ago; was recovering well until presented with 2-3 episodes TIA in Oct 2019. CXR showed mild bibasilar atelectasis slightly increased on right comapred to last exam. Per chart, swallow eval requested due to concern for possible nerve damage from surgery. MBS on 12/28/17 recc D3/Honey.      SLP Plan  Continue with current plan of care       Recommendations  Diet recommendations: Dysphagia 3 (mechanical soft);Nectar-thick liquid(sips of water) Liquids provided via: Cup Medication Administration: Whole meds with liquid(with THICKENED liquid or puree) Supervision: Patient able to self feed;Intermittent supervision to cue for compensatory strategies Compensations: Slow rate;Small sips/bites;Minimize environmental distractions(hold liquid, hold breath, then swallow) Postural Changes and/or Swallow  Maneuvers: Seated upright 90 degrees                Oral Care Recommendations: Oral care BID Follow up Recommendations: Home health SLP SLP Visit Diagnosis: Dysphagia, unspecified (R13.10) Plan: Continue with current plan of care       GO                Germain Osgood 01/06/2018, 2:38 PM  Germain Osgood, M.A. [place] Acute Environmental education officer [phone] Office [phone]

## 2018-01-06 NOTE — Transfer of Care (Signed)
Immediate Anesthesia Transfer of Care Note  [name]  Procedure(s) Performed: STERNAL WOUND DEBRIDEMENT (N/A Chest) APPLICATION OF WOUND VAC (N/A Chest)  Patient Location: PACU  Anesthesia Type:General  Level of Consciousness: awake, alert , oriented and patient cooperative  Airway & Oxygen Therapy: Patient Spontanous Breathing and Patient connected to face mask oxygen  Post-op Assessment: Report given to RN and Post -op Vital signs reviewed and stable  Post vital signs: Reviewed and stable  Last Vitals:  Vitals Value Taken Time  BP 100/85 01/06/2018  9:06 AM  Temp    Pulse 107 01/06/2018  9:08 AM  Resp 18 01/06/2018  9:08 AM  SpO2 99 % 01/06/2018  9:08 AM  Vitals shown include unvalidated device data.  Last Pain:  Vitals:   01/06/18 0241  TempSrc: Oral  PainSc: 8       Patients Stated Pain Goal: 2 (39/68/86 4847)  Complications: No apparent anesthesia complications

## 2018-01-06 NOTE — Care Management Note (Signed)
Case Management Note  Patient Details  Name: JEANNETT DEKONING MRN: [id number] Date of Birth: 13-Jan-1956  Subjective/Objective:   Pt admitted for sternal wound debridement                Action/Plan:  PTA from home husband.     Expected Discharge Date:                  Expected Discharge Plan:     In-House Referral:  Clinical Social Work  Discharge planning Services  CM Consult  Post Acute Care Choice:    Choice offered to:     DME Arranged:    DME Agency:     HH Arranged:    HH Agency:     Status of Service:     If discussed at [employer], dates discussed:    Additional Comments:  Maryclare Labrador, RN 01/06/2018, 11:09 AM

## 2018-01-06 NOTE — Anesthesia Preprocedure Evaluation (Addendum)
Anesthesia Evaluation  Patient identified by MRN, date of birth, ID band Patient awake    Reviewed: Allergy & Precautions, NPO status , Patient's Chart, lab work & pertinent test results  Airway Mallampati: III  TM Distance: >3 FB Neck ROM: Full    Dental  (+) Edentulous Upper, Edentulous Lower   Pulmonary asthma , COPD, former smoker,    breath sounds clear to auscultation       Cardiovascular + CAD, + Past MI and + CABG   Rhythm:Regular Rate:Normal     Neuro/Psych CVA    GI/Hepatic Neg liver ROS, GERD  ,  Endo/Other  negative endocrine ROS  Renal/GU negative Renal ROS     Musculoskeletal negative musculoskeletal ROS (+)   Abdominal Normal abdominal exam  (+)   Peds  Hematology negative hematology ROS (+)   Anesthesia Other Findings   Reproductive/Obstetrics negative OB ROS                            Lab Results  Component Value Date   WBC 13.8 (H) 01/06/2018   HGB 8.2 (L) 01/06/2018   HCT 25.4 (L) 01/06/2018   MCV 89.8 01/06/2018   PLT 303 01/06/2018   Lab Results  Component Value Date   CREATININE 0.60 01/03/2018   BUN 11 01/03/2018   NA 133 (L) 01/03/2018   K 4.0 01/03/2018   CL 91 (L) 01/03/2018   CO2 30 01/03/2018   Lab Results  Component Value Date   INR 1.96 01/06/2018   INR 1.83 01/05/2018   INR 1.34 01/04/2018   EKG: normal sinus rhythm.  Echo: - Left ventricle: The cavity size was normal. There was mild   concentric hypertrophy. Systolic function was normal. Wall motion   was normal; there were no regional wall motion abnormalities.   Doppler parameters are consistent with abnormal left ventricular   relaxation (grade 1 diastolic dysfunction). Doppler parameters   are consistent with high ventricular filling pressure. - Aortic valve: Valve mobility was restricted. There was mild   stenosis. There was mild regurgitation. Peak velocity (S): 288   cm/s. Mean  gradient (S): 17 mm Hg. Valve area (VTI): 0.83 cm^2.   Valve area (Vmax): 0.85 cm^2. Valve area (Vmean): 0.87 cm^2. - Mitral valve: Transvalvular velocity was within the normal range.   There was no evidence for stenosis. There was trivial   regurgitation. - Right ventricle: The cavity size was normal. Wall thickness was   normal. Systolic function was normal. - Atrial septum: No defect or patent foramen ovale was identified. - Tricuspid valve: There was mild regurgitation. - Pulmonary arteries: PA peak pressure: 27 mm Hg (S).  Anesthesia Physical Anesthesia Plan  ASA: III  Anesthesia Plan: General   Post-op Pain Management:    Induction: Intravenous  PONV Risk Score and Plan: 4 or greater and Ondansetron, Dexamethasone, Midazolam and Scopolamine patch - Pre-op  Airway Management Planned: Oral ETT  Additional Equipment: CVP and Ultrasound Guidance Line Placement  Intra-op Plan:   Post-operative Plan: Possible Post-op intubation/ventilation  Informed Consent: I have reviewed the patients History and Physical, chart, labs and discussed the procedure including the risks, benefits and alternatives for the proposed anesthesia with the patient or authorized representative who has indicated his/her understanding and acceptance.   Dental advisory given  Plan Discussed with: CRNA  Anesthesia Plan Comments:       Anesthesia Quick Evaluation

## 2018-01-06 NOTE — Progress Notes (Signed)
Pre Procedure note for inpatients:   Emily Rocha has been scheduled for Procedure(s): STERNAL WOUND DEBRIDEMENT (N/A) APPLICATION OF WOUND VAC (N/A) today. The various methods of treatment have been discussed with the patient. After consideration of the risks, benefits and treatment options the patient has consented to the planned procedure.   The patient has been seen and labs reviewed. There are no changes in the patient's condition to prevent proceeding with the planned procedure today.  Recent labs:  Lab Results  Component Value Date   WBC 13.8 (H) 01/06/2018   HGB 8.2 (L) 01/06/2018   HCT 25.4 (L) 01/06/2018   PLT 303 01/06/2018   GLUCOSE 104 (H) 01/03/2018   CHOL 205 (H) 12/13/2016   TRIG 101 12/13/2016   HDL 68 12/13/2016   LDLCALC 117 (H) 12/13/2016   ALT 26 01/01/2018   AST 20 01/01/2018   NA 133 (L) 01/03/2018   K 4.0 01/03/2018   CL 91 (L) 01/03/2018   CREATININE 0.60 01/03/2018   BUN 11 01/03/2018   CO2 30 01/03/2018   INR 1.96 01/06/2018   HGBA1C 6.2 (H) 12/22/2017    Len Childs, MD 01/06/2018 7:15 AM

## 2018-01-06 NOTE — Progress Notes (Signed)
Gave 1 GRAM vancomycin per Pharmacist, Kim, at 1224 via L IJ. Unable to chart against due to transition from OR MAR on HOLD.

## 2018-01-07 LAB — URINALYSIS, COMPLETE (UACMP) WITH MICROSCOPIC
Bilirubin Urine: NEGATIVE
Glucose, UA: NEGATIVE mg/dL
Hgb urine dipstick: NEGATIVE
KETONES UR: NEGATIVE mg/dL
Leukocytes, UA: NEGATIVE
Nitrite: NEGATIVE
PH: 6 (ref 5.0–8.0)
Protein, ur: NEGATIVE mg/dL
SPECIFIC GRAVITY, URINE: 1.008 (ref 1.005–1.030)

## 2018-01-07 LAB — BASIC METABOLIC PANEL
Anion gap: 6 (ref 5–15)
BUN: 8 mg/dL (ref 8–23)
CO2: 31 mmol/L (ref 22–32)
Calcium: 8.6 mg/dL — ABNORMAL LOW (ref 8.9–10.3)
Chloride: 94 mmol/L — ABNORMAL LOW (ref 98–111)
Creatinine, Ser: 0.64 mg/dL (ref 0.44–1.00)
GFR calc Af Amer: >60 mL/min (ref >60)
GFR calc non Af Amer: >60 mL/min (ref >60)
Glucose, Bld: 127 mg/dL — ABNORMAL HIGH (ref 70–99)
Potassium: 4.7 mmol/L (ref 3.5–5.1)
Sodium: 131 mmol/L — ABNORMAL LOW (ref 135–145)

## 2018-01-07 LAB — GLUCOSE, CAPILLARY
Glucose-Capillary: 113 mg/dL — ABNORMAL HIGH (ref 70–99)
Glucose-Capillary: 125 mg/dL — ABNORMAL HIGH (ref 70–99)
Glucose-Capillary: 138 mg/dL — ABNORMAL HIGH (ref 70–99)

## 2018-01-07 LAB — CBC
HCT: 26.6 % — ABNORMAL LOW (ref 36.0–46.0)
Hemoglobin: 7.9 g/dL — ABNORMAL LOW (ref 12.0–15.0)
MCH: 27.9 pg (ref 26.0–34.0)
MCHC: 29.7 g/dL — ABNORMAL LOW (ref 30.0–36.0)
MCV: 94 fL (ref 80.0–100.0)
Platelets: 366 10*3/uL (ref 150–400)
RBC: 2.83 MIL/uL — ABNORMAL LOW (ref 3.87–5.11)
RDW: 13.6 % (ref 11.5–15.5)
WBC: 16.1 10*3/uL — ABNORMAL HIGH (ref 4.0–10.5)
nRBC: 0 % (ref 0.0–0.2)

## 2018-01-07 LAB — PROTIME-INR
INR: 2.61
Prothrombin Time: 27.5 seconds — ABNORMAL HIGH (ref 11.4–15.2)

## 2018-01-07 NOTE — Plan of Care (Signed)
  Problem: Clinical Measurements: Goal: Will remain free from infection Outcome: Progressing   Problem: Nutrition: Goal: Adequate nutrition will be maintained Outcome: Progressing   Problem: Safety: Goal: Ability to remain free from injury will improve Outcome: Progressing   Problem: Skin Integrity: Goal: Risk for impaired skin integrity will decrease Outcome: Progressing   Problem: Activity: Goal: Risk for activity intolerance will decrease Outcome: Progressing

## 2018-01-07 NOTE — Progress Notes (Signed)
[facility]  Subjective  - Doing well over all.   Objective 115/67 (!) 102 98.3 F (36.8 C) (Oral) 19 97%  Intake/Output Summary (Last 24 hours) at 01/07/2018 1052 Last data filed at 01/07/2018 0800 Gross per 24 hour  Intake 674.59 ml  Output 375 ml  Net 299.59 ml    Sternal wound wound vac Right radial pulse palpable Moving all 4 ext.    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  50% stenosis left common carotid origin severe calcification  Return to the OR for sternal debridement Stable disposition this am.  Roxy Horseman 01/07/2018 10:52 AM --  Laboratory Lab Results: Recent Labs    01/06/18 0218 01/07/18 0534  WBC 13.8* 16.1*  HGB 8.2* 7.9*  HCT 25.4* 26.6*  PLT 303 366   BMET Recent Labs    01/07/18 0534  NA 131*  K 4.7  CL 94*  CO2 31  GLUCOSE 127*  BUN 8  CREATININE 0.64  CALCIUM 8.6*    COAG Lab Results  Component Value Date   INR 2.61 01/07/2018   INR 1.96 01/06/2018   INR 1.83 01/05/2018   No results found for: PTT

## 2018-01-07 NOTE — Progress Notes (Addendum)
      [address]       [place] [address]             [phone]      1 Day Post-Op Procedure(s) (LRB): STERNAL WOUND DEBRIDEMENT (N/A) APPLICATION OF WOUND VAC (N/A)   Subjective:  No new complaints.  States she is doing well.  She wants to get up out of bed and walk.   Objective: Vital signs in last 24 hours: Temp:  [97.6 F (36.4 C)-98.3 F (36.8 C)] 98.3 F (36.8 C) (11/23 0731) Pulse Rate:  [79-107] 102 (11/23 0731) Cardiac Rhythm: Normal sinus rhythm (11/23 0800) Resp:  [11-23] 19 (11/23 0731) BP: (100-124)/(44-73) 115/67 (11/23 0731) SpO2:  [91 %-99 %] 97 % (11/23 0745)  Intake/Output from previous day: 11/22 0701 - 11/23 0700 In: 1134.6 [I.V.:834.6; IV Piggyback:300] Out: 450 [Urine:225; Drains:200; Blood:25] Intake/Output this shift: Total I/O In: 240 [P.O.:240] Out: -   General appearance: alert, cooperative and no distress Heart: regular rate and rhythm Lungs: clear to auscultation bilaterally Wound: pravena wound vac in place  Lab Results: Recent Labs    01/06/18 0218 01/07/18 0534  WBC 13.8* 16.1*  HGB 8.2* 7.9*  HCT 25.4* 26.6*  PLT 303 366   BMET:  Recent Labs    01/07/18 0534  NA 131*  K 4.7  CL 94*  CO2 31  GLUCOSE 127*  BUN 8  CREATININE 0.64  CALCIUM 8.6*    PT/INR:  Recent Labs    01/07/18 0534  LABPROT 27.5*  INR 2.61   ABG    Component Value Date/Time   PHART 7.362 12/28/2017 0546   HCO3 29.6 (H) 12/28/2017 0546   TCO2 31 12/28/2017 0546   O2SAT 92.0 12/28/2017 0546   CBG (last 3)  Recent Labs    01/06/18 1747 01/06/18 2126 01/07/18 0802  GLUCAP 160* 198* 113*    Assessment/Plan: S/P Procedure(s) (LRB): STERNAL WOUND DEBRIDEMENT (N/A) APPLICATION OF WOUND VAC (N/A)  1. CV- NSR, continue Lopressor 25 mg BID 2. Pulm- H/O COPD, weaning oxygen as tolerated, continue pulmonary toilet 3. INR 2.61, will repeat dose of coumadin at 2 mg daily for left arm DVT 4. Dysphagia 3 diet, will  advance per SLP 5. ID- on ABX for sternal wound infection, pravena wound vac in place, will get repeat I/D next wekk 6. Dispo- patient stable, will continue current care, okay to ambulate   LOS: 12 days    Erin [name] 01/07/2018  I have seen and examined the patient and agree with the assessment and plan as outlined.  Patient reports urinary frequency and burning, worried that she may have UTI.  Will check UA, culture.  Rexene Alberts, MD 01/07/2018 1:24 PM

## 2018-01-07 NOTE — Plan of Care (Signed)
Reviewed daily plan of care and discussed need for mobility and pain management

## 2018-01-08 LAB — CBC
HCT: 25 % — ABNORMAL LOW (ref 36.0–46.0)
Hemoglobin: 7.4 g/dL — ABNORMAL LOW (ref 12.0–15.0)
MCH: 28.1 pg (ref 26.0–34.0)
MCHC: 29.6 g/dL — ABNORMAL LOW (ref 30.0–36.0)
MCV: 95.1 fL (ref 80.0–100.0)
Platelets: 347 10*3/uL (ref 150–400)
RBC: 2.63 MIL/uL — ABNORMAL LOW (ref 3.87–5.11)
RDW: 14.2 % (ref 11.5–15.5)
WBC: 9.9 10*3/uL (ref 4.0–10.5)
nRBC: 0 % (ref 0.0–0.2)

## 2018-01-08 LAB — GLUCOSE, CAPILLARY
Glucose-Capillary: 127 mg/dL — ABNORMAL HIGH (ref 70–99)
Glucose-Capillary: 94 mg/dL (ref 70–99)
Glucose-Capillary: 91 mg/dL (ref 70–99)
Glucose-Capillary: 105 mg/dL — ABNORMAL HIGH (ref 70–99)

## 2018-01-08 LAB — PROTIME-INR
INR: 2.41
Prothrombin Time: 25.9 seconds — ABNORMAL HIGH (ref 11.4–15.2)

## 2018-01-08 LAB — VANCOMYCIN, TROUGH: Vancomycin Tr: 14 ug/mL — ABNORMAL LOW (ref 15–20)

## 2018-01-08 MED ORDER — FE FUMARATE-B12-VIT C-FA-IFC PO CAPS
1.0000 | ORAL_CAPSULE | Freq: Three times a day (TID) | ORAL | Status: DC
  Administered 2018-01-08 – 2018-01-13 (×14): 1 via ORAL
  Filled 2018-01-08 (×17): qty 1

## 2018-01-08 NOTE — Progress Notes (Signed)
Pharmacy Antibiotic Note  Emily Rocha is a 62 y.o. female admitted on 12/26/2017 with cellulitis.  Pharmacy has been consulted for vancomycin dosing.  WBC down to 9. Afebrile. Scr 0.6 (CrCl 74 mL/min). Concern for sternal wound for cellulitis.   Vancomycin level at goal (14). Will continue current dose.   Plan: Vancomycin 1000 mg IV every 12 hours.  Goal trough 10-15 mcg/mL.  Monitor renal fx, clinical pic, cx results, and Recheck VT as appropriate  Height: 5\' 4"  (162.6 cm) Weight: 184 lb 8.4 oz (83.7 kg) IBW/kg (Calculated) : 54.7  Temp (24hrs), Avg:97.9 F (36.6 C), Min:97.7 F (36.5 C), Max:98.4 F (36.9 C)  Recent Labs  Lab 01/02/18 0642 01/03/18 0241 01/04/18 0136 01/05/18 0237 01/06/18 0218 01/07/18 0534 01/08/18 0348 01/08/18 1054  WBC 12.3*  --  15.5* 14.1* 13.8* 16.1* 9.9  --   CREATININE 0.63 0.60  --   --   --  0.64  --   --   VANCOTROUGH  --   --   --   --   --   --   --  14*    Estimated Creatinine Clearance: 74.4 mL/min (by C-G formula based on SCr of 0.64 mg/dL).    No Known Allergies  Antimicrobials this admission: Peri-op abx Vancomycin 11/20 >>   Dose adjustments this admission: N/A  Microbiology results: 11/14 Wound Cx: no orgs 11/22 wound: ngtd 11/23 urine- sent  Thank you for allowing pharmacy to be a part of this patient's care.  Erin Hearing PharmD., BCPS Clinical Pharmacist 01/08/2018 12:00 PM

## 2018-01-08 NOTE — Plan of Care (Signed)
Discussed plan to be more mobile today. Up to chair for meals.

## 2018-01-08 NOTE — Progress Notes (Addendum)
      [address]       [place] [address]             [phone]      2 Days Post-Op Procedure(s) (LRB): STERNAL WOUND DEBRIDEMENT (N/A) APPLICATION OF WOUND VAC (N/A)   Subjective:  Feels lousy today.  States she is having more pain across her chest due to being up and moving more.  She denies N/V, SOB.  Objective: Vital signs in last 24 hours: Temp:  [97.7 F (36.5 C)-98.4 F (36.9 C)] 97.7 F (36.5 C) (11/24 0735) Pulse Rate:  [79-114] 92 (11/24 0300) Cardiac Rhythm: Normal sinus rhythm (11/24 0800) Resp:  [15-22] 21 (11/24 0300) BP: (106-123)/(52-65) 106/55 (11/24 0300) SpO2:  [93 %-100 %] 97 % (11/24 0728)  Intake/Output from previous day: 11/23 0701 - 11/24 0700 In: 2484.3 [P.O.:1680; I.V.:304.3; IV Piggyback:500] Out: 1625 [Urine:1575; Drains:50] Intake/Output this shift: Total I/O In: 240 [P.O.:240] Out: 0   General appearance: alert, cooperative and no distress Heart: regular rate and rhythm Lungs: clear to auscultation bilaterally Abdomen: soft, non-tender; bowel sounds normal; no masses,  no organomegaly Extremities: extremities normal, atraumatic, no cyanosis or edema Wound: Pravena wound vac in place  Lab Results: Recent Labs    01/07/18 0534 01/08/18 0348  WBC 16.1* 9.9  HGB 7.9* 7.4*  HCT 26.6* 25.0*  PLT 366 347   BMET:  Recent Labs    01/07/18 0534  NA 131*  K 4.7  CL 94*  CO2 31  GLUCOSE 127*  BUN 8  CREATININE 0.64  CALCIUM 8.6*    PT/INR:  Recent Labs    01/08/18 0348  LABPROT 25.9*  INR 2.41   ABG    Component Value Date/Time   PHART 7.362 12/28/2017 0546   HCO3 29.6 (H) 12/28/2017 0546   TCO2 31 12/28/2017 0546   O2SAT 92.0 12/28/2017 0546   CBG (last 3)  Recent Labs    01/07/18 1204 01/07/18 1656 01/08/18 0820  GLUCAP 125* 138* 127*    Assessment/Plan: S/P Procedure(s) (LRB): STERNAL WOUND DEBRIDEMENT (N/A) APPLICATION OF WOUND VAC (N/A)  1. CV- NSR, on Lopressor 25 mg BID 2.  Pulm- H/O COPD, weaning oxygen as tolerated, continue pulmonary toilet 3. INR 2.41, will continue coumadin at 2 mg daily 4. Dysphagia- on soft diet, advance per SLP 5. Dysuria- UA Negative 6. Expected post operative blood loss anemia, Hgb at 7.4, will start iron, folate 7. ID-sternal cellulitis- continue ABX 8. dispo- patient with more pain today, however she was up and about more yesterday, start iron for Hgb of 7.4 and monitor, continue coumadin at 2 mg daily INR is therapeutic, continue current care   LOS: 13 days    Emily Rocha 01/08/2018  I have seen and examined the patient and agree with the assessment and plan as outlined.  Emily Alberts, MD 01/08/2018 11:07 AM

## 2018-01-08 NOTE — Progress Notes (Addendum)
[facility]  Subjective  - Doing well over all.  Has sternal pain, not CP.   Objective (!) 106/55 92 97.7 F (36.5 C) (Oral) (!) 21 97%  Intake/Output Summary (Last 24 hours) at 01/08/2018 0727 Last data filed at 01/08/2018 0600 Gross per 24 hour  Intake 2474.26 ml  Output 1625 ml  Net 849.26 ml   B UE motor, sensation intact skin warm to touch grip 5/5 B Palpable radial pulse B Speech clear Wound vac over sternum    Assessment/Planning: S/p aorto carotid subclavian bypass Disposition stable from a vascular point of view. HGB 7.4 will let CT surgery follow this.   Roxy Horseman 01/08/2018 7:27 AM --  Laboratory Lab Results: Recent Labs    01/07/18 0534 01/08/18 0348  WBC 16.1* 9.9  HGB 7.9* 7.4*  HCT 26.6* 25.0*  PLT 366 347   BMET Recent Labs    01/07/18 0534  NA 131*  K 4.7  CL 94*  CO2 31  GLUCOSE 127*  BUN 8  CREATININE 0.64  CALCIUM 8.6*    COAG Lab Results  Component Value Date   INR 2.41 01/08/2018   INR 2.61 01/07/2018   INR 1.96 01/06/2018   No results found for: PTT  I agree with the above.  Have seen and evaluated the patient.  She continues to have a palpable right radial pulse.  She remains hoarse and somewhat congested.  Annamarie Major

## 2018-01-09 ENCOUNTER — Encounter (HOSPITAL_COMMUNITY): Payer: Self-pay | Admitting: Cardiothoracic Surgery

## 2018-01-09 LAB — TYPE AND SCREEN
ABO/RH(D): A NEG
Antibody Screen: NEGATIVE
Unit division: 0
Unit division: 0

## 2018-01-09 LAB — BASIC METABOLIC PANEL
ANION GAP: 7 (ref 5–15)
BUN: 6 mg/dL — AB (ref 8–23)
CHLORIDE: 96 mmol/L — AB (ref 98–111)
CO2: 31 mmol/L (ref 22–32)
Calcium: 8.6 mg/dL — ABNORMAL LOW (ref 8.9–10.3)
Creatinine, Ser: 0.57 mg/dL (ref 0.44–1.00)
GFR calc Af Amer: >60 mL/min (ref >60)
GLUCOSE: 107 mg/dL — AB (ref 70–99)
POTASSIUM: 3.6 mmol/L (ref 3.5–5.1)
Sodium: 134 mmol/L — ABNORMAL LOW (ref 135–145)

## 2018-01-09 LAB — GLUCOSE, CAPILLARY
Glucose-Capillary: 93 mg/dL (ref 70–99)
Glucose-Capillary: 114 mg/dL — ABNORMAL HIGH (ref 70–99)
Glucose-Capillary: 113 mg/dL — ABNORMAL HIGH (ref 70–99)

## 2018-01-09 LAB — BPAM RBC
Blood Product Expiration Date: 201912012359
Blood Product Expiration Date: 201912062359
ISSUE DATE / TIME: 201911071335
ISSUE DATE / TIME: 201911191215
Unit Type and Rh: 600
Unit Type and Rh: 600

## 2018-01-09 LAB — CBC
HEMATOCRIT: 27 % — AB (ref 36.0–46.0)
HEMOGLOBIN: 8 g/dL — AB (ref 12.0–15.0)
MCH: 27.5 pg (ref 26.0–34.0)
MCHC: 29.6 g/dL — ABNORMAL LOW (ref 30.0–36.0)
MCV: 92.8 fL (ref 80.0–100.0)
PLATELETS: 410 10*3/uL — AB (ref 150–400)
RBC: 2.91 MIL/uL — AB (ref 3.87–5.11)
RDW: 14.1 % (ref 11.5–15.5)
WBC: 9.6 10*3/uL (ref 4.0–10.5)
nRBC: 0.2 % (ref 0.0–0.2)

## 2018-01-09 LAB — PROTIME-INR
INR: 1.87
Prothrombin Time: 21.3 seconds — ABNORMAL HIGH (ref 11.4–15.2)

## 2018-01-09 MED ORDER — WARFARIN SODIUM 2.5 MG PO TABS
2.5000 mg | ORAL_TABLET | Freq: Every day | ORAL | Status: DC
  Administered 2018-01-09: 2.5 mg via ORAL
  Filled 2018-01-09: qty 1

## 2018-01-09 MED ORDER — POTASSIUM CHLORIDE CRYS ER 20 MEQ PO TBCR
20.0000 meq | EXTENDED_RELEASE_TABLET | Freq: Once | ORAL | Status: AC
  Administered 2018-01-09: 20 meq via ORAL
  Filled 2018-01-09: qty 1

## 2018-01-09 NOTE — Progress Notes (Addendum)
      [address]       ,[place] [address]             [phone]        3 Days Post-Op Procedure(s) (LRB): STERNAL WOUND DEBRIDEMENT (N/A) APPLICATION OF WOUND VAC (N/A)  Subjective: Patient remains with hoarsenss. She states she had a very rough weekend. She had a lot of pain at sternal wound VAC and she was stuck numerous times for IV. She states her breathing is pretty good this am.  Objective: Vital signs in last 24 hours: Temp:  [97.5 F (36.4 C)-98.5 F (36.9 C)] 97.5 F (36.4 C) (11/25 0521) Pulse Rate:  [83-97] 94 (11/25 0521) Cardiac Rhythm: Normal sinus rhythm (11/25 0710) Resp:  [18-30] 24 (11/25 0521) BP: (103-115)/(54-65) 108/61 (11/25 0521) SpO2:  [92 %-100 %] 95 % (11/25 0729)   Current Weight  01/02/18 83.7 kg       Intake/Output from previous day: 11/24 0701 - 11/25 0700 In: 6433 [P.O.:690; I.V.:230; IV Piggyback:500] Out: 0    Physical Exam:  Cardiovascular: RRR Pulmonary: Clear bilaterally Abdomen: Soft, non tender, bowel sounds present. Wounds: Sternal wound has Praveena wound VAC.  Chest tube wound is clean and dry.  Lab Results: CBC: Recent Labs    01/08/18 0348 01/09/18 0616  WBC 9.9 9.6  HGB 7.4* 8.0*  HCT 25.0* 27.0*  PLT 347 410*   BMET:  Recent Labs    01/07/18 0534 01/09/18 0616  NA 131* 134*  K 4.7 3.6  CL 94* 96*  CO2 31 31  GLUCOSE 127* 107*  BUN 8 6*  CREATININE 0.64 0.57  CALCIUM 8.6* 8.6*    PT/INR:  Lab Results  Component Value Date   INR 1.87 01/09/2018   INR 2.41 01/08/2018   INR 2.61 01/07/2018   ABG:  INR: Will add last result for INR, ABG once components are confirmed Will add last 4 CBG results once components are confirmed  Assessment/Plan:  1. CV - Some SR. On Lopressor 25 mg bid. 2.  Pulmonary - History of COPD. On 2 liters of oxygen via [place]. May need oxygen at discharge. Continue Mucinex for cough, Xopenex tid, and Incruse Ellipta. Encourage incentive  spirometer. 3. Volume Overload - on Lasix 40 mg daily 4.  Acute blood loss anemia - H and H stable at 8 and 27. Continue Trinsicon 5. DVT left IJ, subclavian, and axillary veins-on Coumadin. INR this am decreased to 1.87. Will give Coumadin 2.5 mg.  6. GI-on soft diet, nectar thick liquids 7. ID-on Vanco and Ceftriaxone for sternal wound cellulitis S/p superficial sternal wound debridement, irrigation, Preveena VAC, and Acell. OR cultures no growth to date 8. Supplement potassium  Donielle M ZimmermanPA-C 01/09/2018,7:48 AM [phone]   Superficial sternal wound infection/cellulitis with no growth on operative cultures taken at debridement Cont ceftriaxone -vanc and plan VAC change Friday Coumadin for L arm DVT at [facility] site  patient examined and medical record reviewed,agree with above note. Tharon Aquas Trigt III 01/09/2018

## 2018-01-09 NOTE — Progress Notes (Signed)
[facility] Infusion Coordinator will follow [name] during this hospitalization with Case Mgr and CVTS team  to support home infusion pharmacy services if needed at DC.  If patient discharges after hours, please call [phone].   Larry Sierras 01/09/2018, 11:15 AM

## 2018-01-09 NOTE — Discharge Instructions (Addendum)
Please use 2x2 wet to dry dressing and place in open chest tube wound. Change daily and PRN  Discharge Instructions:  1.   No tub baths or swimming until incisions have completely healed.  If your incisions become red or develop any drainage please call our office at [phone]  2. No Driving until cleared by Dr. Lucianne Lei Trigt's office and you are no longer using narcotic pain medications  3. Monitor your weight daily.. Please use the same scale and weigh at same time... If you gain 5-10 lbs in 48 hours with associated lower extremity swelling, please contact our office at [phone]  4. Fever of 101.5 for at least 24 hours with no source, please contact our office at [phone]  5. Activity- up as tolerated, please walk at least 3 times per day.  Avoid strenuous activity, no lifting, pushing, or pulling with your arms over 8-10 lbs for a minimum of 6 weeks  6. If any questions or concerns arise, please do not hesitate to contact our office at [phone]  Information on my medicine - Coumadin   (Warfarin)  Why was Coumadin prescribed for you? Coumadin was prescribed for you because you have a blood clot or a medical condition that can cause an increased risk of forming blood clots. Blood clots can cause serious health problems by blocking the flow of blood to the heart, lung, or brain. Coumadin can prevent harmful blood clots from forming. As a reminder your indication for Coumadin is:   Deep Vein Thrombosis Treatment  What test will check on my response to Coumadin? While on Coumadin (warfarin) you will need to have an INR test regularly to ensure that your dose is keeping you in the desired range. The INR (international normalized ratio) number is calculated from the result of the laboratory test called prothrombin time (PT).  If an INR APPOINTMENT HAS NOT ALREADY BEEN MADE FOR YOU please schedule an appointment to have this lab work done by your health care provider within 7  days. Your INR goal is usually a number between:  2 to 3 or your provider may give you a more narrow range like 2-2.5.  Ask your health care provider during an office visit what your goal INR is.  What  do you need to  know  About  COUMADIN? Take Coumadin (warfarin) exactly as prescribed by your healthcare provider about the same time each day.  DO NOT stop taking without talking to the doctor who prescribed the medication.  Stopping without other blood clot prevention medication to take the place of Coumadin may increase your risk of developing a new clot or stroke.  Get refills before you run out.  What do you do if you miss a dose? If you miss a dose, take it as soon as you remember on the same day then continue your regularly scheduled regimen the next day.  Do not take two doses of Coumadin at the same time.  Important Safety Information A possible side effect of Coumadin (Warfarin) is an increased risk of bleeding. You should call your healthcare provider right away if you experience any of the following: ? Bleeding from an injury or your nose that does not stop. ? Unusual colored urine (red or dark brown) or unusual colored stools (red or black). ? Unusual bruising for unknown reasons. ? A serious fall or if you hit your head (even if there is no bleeding).  Some foods or medicines interact with Coumadin (warfarin) and might alter  your response to warfarin. To help avoid this: ? Eat a balanced diet, maintaining a consistent amount of Vitamin K. ? Notify your provider about major diet changes you plan to make. ? Avoid alcohol or limit your intake to 1 drink for women and 2 drinks for men per day. (1 drink is 5 oz. wine, 12 oz. beer, or 1.5 oz. liquor.)  Make sure that ANY health care provider who prescribes medication for you knows that you are taking Coumadin (warfarin).  Also make sure the healthcare provider who is monitoring your Coumadin knows when you have started a new medication  including herbals and non-prescription products.  Coumadin (Warfarin)  Major Drug Interactions  Increased Warfarin Effect Decreased Warfarin Effect  Alcohol (large quantities) Antibiotics (esp. Septra/Bactrim, Flagyl, Cipro) Amiodarone (Cordarone) Aspirin (ASA) Cimetidine (Tagamet) Megestrol (Megace) NSAIDs (ibuprofen, naproxen, etc.) Piroxicam (Feldene) Propafenone (Rythmol SR) Propranolol (Inderal) Isoniazid (INH) Posaconazole (Noxafil) Barbiturates (Phenobarbital) Carbamazepine (Tegretol) Chlordiazepoxide (Librium) Cholestyramine (Questran) Griseofulvin Oral Contraceptives Rifampin Sucralfate (Carafate) Vitamin K   Coumadin (Warfarin) Major Herbal Interactions  Increased Warfarin Effect Decreased Warfarin Effect  Garlic Ginseng Ginkgo biloba Coenzyme Q10 Green tea St. Johns wort    Coumadin (Warfarin) FOOD Interactions  Eat a consistent number of servings per week of foods HIGH in Vitamin K (1 serving =  cup)  Collards (cooked, or boiled & drained) Kale (cooked, or boiled & drained) Mustard greens (cooked, or boiled & drained) Parsley *serving size only =  cup Spinach (cooked, or boiled & drained) Swiss chard (cooked, or boiled & drained) Turnip greens (cooked, or boiled & drained)  Eat a consistent number of servings per week of foods MEDIUM-HIGH in Vitamin K (1 serving = 1 cup)  Asparagus (cooked, or boiled & drained) Broccoli (cooked, boiled & drained, or raw & chopped) Brussel sprouts (cooked, or boiled & drained) *serving size only =  cup Lettuce, raw (green leaf, endive, romaine) Spinach, raw Turnip greens, raw & chopped   These websites have more information on Coumadin (warfarin):  FailFactory.se; VeganReport.com.au;

## 2018-01-09 NOTE — Progress Notes (Signed)
   VASCULAR SURGERY ASSESSMENT & PLAN:   Wound VAC with good seal.  Vascular surgery following peripherally.  SUBJECTIVE:   She has been having some discomfort from her sternal wound but this was better today.  PHYSICAL EXAM:   Vitals:   01/09/18 0521 01/09/18 0729 01/09/18 0900 01/09/18 1021  BP: 108/61   115/78  Pulse: 94   99  Resp: (!) 24     Temp: (!) 97.5 F (36.4 C)  98.1 F (36.7 C)   TempSrc: Oral  Oral   SpO2: 96% 95%    Weight:      Height:       LUNGS: Rhonchi Palpable right radial pulse VAC with good seal  LABS:   Lab Results  Component Value Date   WBC 9.6 01/09/2018   HGB 8.0 (L) 01/09/2018   HCT 27.0 (L) 01/09/2018   MCV 92.8 01/09/2018   PLT 410 (H) 01/09/2018   Lab Results  Component Value Date   CREATININE 0.57 01/09/2018   Lab Results  Component Value Date   INR 1.87 01/09/2018   CBG (last 3)  Recent Labs    01/08/18 1714 01/08/18 2039 01/09/18 0757  GLUCAP 91 105* 93    PROBLEM LIST:    Active Problems:   TIA due to embolism (HCC)   CURRENT MEDS:   . atorvastatin  40 mg Oral Daily  . bisacodyl  10 mg Oral Daily  . famotidine  20 mg Oral BID  . ferrous [id number] C-folic acid  1 capsule Oral TID PC  . furosemide  40 mg Oral Daily  . levalbuterol  0.63 mg Nebulization BID  . metoprolol tartrate  25 mg Oral BID  . mometasone-formoterol  2 puff Inhalation BID  . senna-docusate  1 tablet Oral QHS  . sodium chloride flush  10-40 mL Intracatheter Q12H  . umeclidinium bromide  1 puff Inhalation Daily  . warfarin  2.5 mg Oral q1800  . Warfarin - Physician Dosing Inpatient   Does not apply [place]: [phone] Office: [phone] 01/09/2018

## 2018-01-10 LAB — GLUCOSE, CAPILLARY
Glucose-Capillary: 99 mg/dL (ref 70–99)
Glucose-Capillary: 117 mg/dL — ABNORMAL HIGH (ref 70–99)
Glucose-Capillary: 87 mg/dL (ref 70–99)
Glucose-Capillary: 205 mg/dL — ABNORMAL HIGH (ref 70–99)

## 2018-01-10 LAB — PROTIME-INR
INR: 1.97
Prothrombin Time: 22.2 seconds — ABNORMAL HIGH (ref 11.4–15.2)

## 2018-01-10 MED ORDER — ENSURE ENLIVE PO LIQD
237.0000 mL | Freq: Two times a day (BID) | ORAL | Status: DC
  Administered 2018-01-10 – 2018-01-13 (×7): 237 mL via ORAL

## 2018-01-10 MED ORDER — WARFARIN SODIUM 2 MG PO TABS
2.0000 mg | ORAL_TABLET | Freq: Every day | ORAL | Status: DC
  Administered 2018-01-10 – 2018-01-12 (×3): 2 mg via ORAL
  Filled 2018-01-10 (×3): qty 1

## 2018-01-10 NOTE — Progress Notes (Signed)
Initial Nutrition Assessment  DOCUMENTATION CODES:   Obesity unspecified  INTERVENTION:   -Ensure Enlive po BID, each supplement provides 350 kcal and 20 grams of protein -Magic Cup BID with meals, each supplement provides 290 kcals and 9 grams protein -Continue ferrous furmurate, N-56, vitamin C, folic acid capsule daily  NUTRITION DIAGNOSIS:   Increased nutrient needs related to wound healing, post-op healing as evidenced by estimated needs.  GOAL:   Patient will meet greater than or equal to 90% of their needs  MONITOR:   PO intake, Supplement acceptance, Diet advancement, Labs, Weight trends, Skin, I & O's  REASON FOR ASSESSMENT:   LOS    ASSESSMENT:   62 year old status post aortic right innominate artery grafting 12/26/2017 extubated 12/27/2017 with hypercarbia post extubation  RD pulled to chart due LOS, decreased appetite, and wounds.   11/11- Procedure(s): REDO STERNOTOMY (N/A); AORTIC TO RIGHT COMMON CAROTID AND RIGHT SUBCLAVIAN  ARTERY  BYPASS (Right) REPAIR OF DIAPHRAGM; Right lung Nodule not Approachable from Sternotomy  11/12- extubated, PICC placed 11/13- s/p BSE- recommended NPO, s/p MBSS- advanced diet to dysphagia 3 diet with honey thick liquids 11/20- s/p BSE- advanced to dysphagia 3 diet with nectar thick liquids 11/22- Procedure(s): STERNAL WOUND DEBRIDEMENT (N/A); APPLICATION OF WOUND VAC (N/A); Wound irrigation, excisional superficial wound debridement  Per MD notes, plan for additional I&D Friday (01/13/18).   Spoke with pt at bedside, who was sipping water at time of visit. She reports appetite has been decreased since hospitalization ("I just don't feel like eating"). She reports her swallowing has improved and is doing well with the thickened liquids. Meal completion variable, PO: 25-90%.   Pt reports having a hearty appetite prior to hospitalization. She shares that she typically eats 3 large meals per day. She denies any weight loss PTA,  however, suspects she may have lost weight during hospitalization. Noted wt stability over the past 9 months.   Discussed with pt importance of good meal and supplement intake to promote healing. Pt amenable to supplements to help support healing.   Labs reviewed: CBGS: 99-117.  NUTRITION - FOCUSED PHYSICAL EXAM:    Most Recent Value  Orbital Region  No depletion  Upper Arm Region  No depletion  Thoracic and Lumbar Region  No depletion  Buccal Region  No depletion  Temple Region  No depletion  Clavicle Bone Region  No depletion  Clavicle and Acromion Bone Region  No depletion  Scapular Bone Region  No depletion  Dorsal Hand  No depletion  Patellar Region  No depletion  Anterior Thigh Region  No depletion  Posterior Calf Region  No depletion  Edema (RD Assessment)  Mild  Hair  Reviewed  Eyes  Reviewed  Mouth  Reviewed  Skin  Reviewed  Nails  Reviewed       Diet Order:   Diet Order            DIET SOFT Room service appropriate? Yes with Assist; Fluid consistency: Nectar Thick  Diet effective now              EDUCATION NEEDS:   Education needs have been addressed  Skin:  Skin Assessment: Skin Integrity Issues: Skin Integrity Issues:: Wound VAC Wound Vac: chest  Last BM:  01/05/18  Height:   Ht Readings from Last 1 Encounters:  12/26/17 5\' 4"  (1.626 m)    Weight:   Wt Readings from Last 1 Encounters:  01/02/18 83.7 kg    Ideal Body Weight:  54.5 kg  BMI:  Body mass index is 31.67 kg/m.  Estimated Nutritional Needs:   Kcal:  1700-1900  Protein:  105-120 grams  Fluid:  > 1.7 L    Maxtyn Nuzum A. Jimmye Norman, RD, LDN, CDE Pager: [phone] After hours Pager: [phone]

## 2018-01-10 NOTE — Progress Notes (Signed)
  Speech Language Pathology Treatment: Dysphagia  Patient Details Name: Emily Rocha MRN: [id number] DOB: 24-Feb-1955 Today's Date: 01/10/2018 Time: 5320-2334 SLP Time Calculation (min) (ACUTE ONLY): 22 min  Assessment / Plan / Recommendation Clinical Impression  Patient asleep when entering room, but willing to wake and participate in speech therapy. Her voice was judge to be slightly hoarse (improved- self reported) and her cough was congested, but strong. After reviewing compensatory strategy (hold small sip of liquid in mouth, hold breath and swallow hard) she tolerated small sips (6) from cup of thin liquid with no coughing/clearing or wet vocal quality. Patient also tolerated graham crackers followed by sip of water with minimal verbal cues for compensatory strategies. Recommend patient continue current diet of Dys 3, nectar thickened liquids with sips of water periodically between meals with supervision. As patient's voice and cough continue to improve in strength will consider upgrading diet. Speech therapy will continue to monitor diet tolerance and provide education for compensatory strategies.    HPI HPI: Pt is a 62 year old female presented 12/26/17 for redo sternotomy with aortic to right common carotid and right subclavian artery bypass surgery - intubated for 1 day (11/11-11/12). PMH heavy tobacco use (quit 1 year ago), asthma, GERD, pneumonia, SOB, PVD, stroke. Pt also had CABG x3 1 year ago; was recovering well until presented with 2-3 episodes TIA in Oct 2019. CXR showed mild bibasilar atelectasis slightly increased on right comapred to last exam. Per chart, swallow eval requested due to concern for possible nerve damage from surgery. MBS on 12/28/17 recc D3/Honey.      SLP Plan  Continue with current plan of care       Recommendations  Diet recommendations: Dysphagia 3 (mechanical soft);Nectar-thick liquid(small sips of water with compensatory strategies) Liquids provided via:  Cup Medication Administration: Whole meds with liquid Supervision: Patient able to self feed;Intermittent supervision to cue for compensatory strategies Compensations: Slow rate;Small sips/bites;Minimize environmental distractions Postural Changes and/or Swallow Maneuvers: Seated upright 90 degrees                Plan: Continue with current plan of care       [place], MA, CCC-SLP 01/10/2018 10:04 AM

## 2018-01-10 NOTE — Plan of Care (Signed)
  Problem: Clinical Measurements: Goal: Will remain free from infection Outcome: Progressing Goal: Respiratory complications will improve Outcome: Progressing Goal: Cardiovascular complication will be avoided Outcome: Progressing   Problem: Skin Integrity: Goal: Risk for impaired skin integrity will decrease Outcome: Progressing   Problem: Education: Goal: Will demonstrate proper wound care and an understanding of methods to prevent future damage Outcome: Progressing Goal: Knowledge of disease or condition will improve Outcome: Progressing Goal: Knowledge of the prescribed therapeutic regimen will improve Outcome: Progressing Goal: Individualized Educational Video(s) Outcome: Progressing   Problem: Cardiac: Goal: Will achieve and/or maintain hemodynamic stability Outcome: Progressing   Problem: Clinical Measurements: Goal: Postoperative complications will be avoided or minimized Outcome: Progressing   Problem: Respiratory: Goal: Respiratory status will improve Outcome: Progressing   Problem: Skin Integrity: Goal: Wound healing without signs and symptoms of infection Outcome: Progressing Goal: Risk for impaired skin integrity will decrease Outcome: Progressing   Problem: Urinary Elimination: Goal: Ability to achieve and maintain adequate renal perfusion and functioning will improve Outcome: Progressing

## 2018-01-10 NOTE — Progress Notes (Addendum)
      [address]       [place] [id number]             [phone]        4 Days Post-Op Procedure(s) (LRB): STERNAL WOUND DEBRIDEMENT (N/A) APPLICATION OF WOUND VAC (N/A)  Subjective: Patient remains with hoarsenss. She has less pain overall  from Pravena wound VAC   Objective: Vital signs in last 24 hours: Temp:  [97.7 F (36.5 C)-99.2 F (37.3 C)] 97.7 F (36.5 C) (11/26 0733) Pulse Rate:  [82-102] 90 (11/26 0720) Cardiac Rhythm: Normal sinus rhythm (11/26 0448) Resp:  [14-22] 14 (11/26 0720) BP: (98-143)/(32-78) 115/59 (11/26 0720) SpO2:  [92 %-98 %] 92 % (11/26 0720)   Current Weight  01/02/18 83.7 kg      Intake/Output from previous day: 11/25 0701 - 11/26 0700 In: 2697.1 [IV Piggyback:2697.1] Out: 2000 [Urine:2000]   Physical Exam:  Cardiovascular: RRR Pulmonary: Clear bilaterally Abdomen: Soft, non tender, bowel sounds present. Wounds: Sternal wound has Praveena wound VAC.  Chest tube wound is clean and dry.  Lab Results: CBC: Recent Labs    01/08/18 0348 01/09/18 0616  WBC 9.9 9.6  HGB 7.4* 8.0*  HCT 25.0* 27.0*  PLT 347 410*   BMET:  Recent Labs    01/09/18 0616  NA 134*  K 3.6  CL 96*  CO2 31  GLUCOSE 107*  BUN 6*  CREATININE 0.57  CALCIUM 8.6*    PT/INR:  Lab Results  Component Value Date   INR 1.97 01/10/2018   INR 1.87 01/09/2018   INR 2.41 01/08/2018   ABG:  INR: Will add last result for INR, ABG once components are confirmed Will add last 4 CBG results once components are confirmed  Assessment/Plan:  1. CV - Some SR. On Lopressor 25 mg bid. 2.  Pulmonary - History of COPD. On 1 liter of oxygen via [place].  Continue Mucinex for cough, Xopenex tid, and Incruse Ellipta. Encourage incentive spirometer. 3. Volume Overload - on Lasix 40 mg daily 4.  Acute blood loss anemia - H and H stable at 8 and 27. Continue Trinsicon 5. DVT left IJ, subclavian, and axillary veins-on Coumadin. INR this am slightly  increased to 1.97. Will give Coumadin 2 mg.  6. GI-on soft diet, nectar thick liquids 7. ID-on Vanco and Ceftriaxone for sternal wound cellulitis S/p superficial sternal wound debridement, irrigation, Pravena VAC, and Acell. OR cultures no growth to date. Per Dr. Prescott Gum, will change Pravena VAC on Friday.   Donielle M ZimmermanPA-C 01/10/2018,7:34 AM [phone]    Chart reviewed, patient examined, agree with above. Continue current plans. Scheduled for wound vac change on Friday.

## 2018-01-10 NOTE — Progress Notes (Signed)
   VASCULAR SURGERY ASSESSMENT & PLAN:   STATUS POST AORTO INNOMINATE BYPASS: She has a palpable right radial pulse.  Her pulmonary status is improved today.  Dr. Darcey Nora is managing her mediastinal VAC.  SUBJECTIVE:   Feels better today.  PHYSICAL EXAM:   Vitals:   01/09/18 1951 01/09/18 2103 01/09/18 2345 01/10/18 0448  BP: 115/66  104/67 (!) 118/58  Pulse: 86  83 88  Resp: 19  (!) 21 (!) 22  Temp: 98.5 F (36.9 C)  99.2 F (37.3 C) 98.1 F (36.7 C)  TempSrc: Oral  Oral Oral  SpO2: 96% 96% 92% 98%  Weight:      Height:       Palpable right radial pulse.  LABS:   CBG (last 3)  Recent Labs    01/09/18 0757 01/09/18 1210 01/09/18 2131  GLUCAP 93 114* 113*    PROBLEM LIST:    Active Problems:   TIA due to embolism (HCC)  CURRENT MEDS:   . atorvastatin  40 mg Oral Daily  . bisacodyl  10 mg Oral Daily  . famotidine  20 mg Oral BID  . ferrous [id number] C-folic acid  1 capsule Oral TID PC  . furosemide  40 mg Oral Daily  . levalbuterol  0.63 mg Nebulization BID  . metoprolol tartrate  25 mg Oral BID  . mometasone-formoterol  2 puff Inhalation BID  . senna-docusate  1 tablet Oral QHS  . sodium chloride flush  10-40 mL Intracatheter Q12H  . umeclidinium bromide  1 puff Inhalation Daily  . warfarin  2.5 mg Oral q1800  . Warfarin - Physician Dosing Inpatient   Does not apply [place]: [phone] Office: [phone] 01/10/2018

## 2018-01-11 ENCOUNTER — Inpatient Hospital Stay (HOSPITAL_COMMUNITY): Payer: BLUE CROSS/BLUE SHIELD

## 2018-01-11 LAB — BASIC METABOLIC PANEL
Anion gap: 10 (ref 5–15)
BUN: 6 mg/dL — ABNORMAL LOW (ref 8–23)
CALCIUM: 8.7 mg/dL — AB (ref 8.9–10.3)
CO2: 32 mmol/L (ref 22–32)
CREATININE: 0.57 mg/dL (ref 0.44–1.00)
Chloride: 92 mmol/L — ABNORMAL LOW (ref 98–111)
GFR calc Af Amer: >60 mL/min (ref >60)
GLUCOSE: 93 mg/dL (ref 70–99)
Potassium: 3.6 mmol/L (ref 3.5–5.1)
SODIUM: 134 mmol/L — AB (ref 135–145)

## 2018-01-11 LAB — GLUCOSE, CAPILLARY
Glucose-Capillary: 104 mg/dL — ABNORMAL HIGH (ref 70–99)
Glucose-Capillary: 139 mg/dL — ABNORMAL HIGH (ref 70–99)
Glucose-Capillary: 192 mg/dL — ABNORMAL HIGH (ref 70–99)
Glucose-Capillary: 141 mg/dL — ABNORMAL HIGH (ref 70–99)

## 2018-01-11 LAB — AEROBIC/ANAEROBIC CULTURE W GRAM STAIN (SURGICAL/DEEP WOUND): Culture: NO GROWTH

## 2018-01-11 LAB — PROTIME-INR
INR: 2.26
Prothrombin Time: 24.7 seconds — ABNORMAL HIGH (ref 11.4–15.2)

## 2018-01-11 MED ORDER — POTASSIUM CHLORIDE CRYS ER 20 MEQ PO TBCR
40.0000 meq | EXTENDED_RELEASE_TABLET | Freq: Once | ORAL | Status: AC
  Administered 2018-01-11: 40 meq via ORAL
  Filled 2018-01-11: qty 2

## 2018-01-11 MED ORDER — WHITE PETROLATUM EX OINT
TOPICAL_OINTMENT | CUTANEOUS | Status: AC
  Administered 2018-01-11: 0.2
  Filled 2018-01-11: qty 28.35

## 2018-01-11 NOTE — Progress Notes (Signed)
Pt back on unit. Stable in bed.

## 2018-01-11 NOTE — Plan of Care (Addendum)
Wrong entry

## 2018-01-11 NOTE — Progress Notes (Signed)
Modified Barium Swallow Progress Note  Patient Details  Name: Emily Rocha MRN: [id number] Date of Birth: 02-22-1955  Today's Date: 01/11/2018  Modified Barium Swallow completed.  Full report located under Chart Review in the Imaging Section.  Brief recommendations include the following:  Clinical Impression  Pt demonstrates improved swallow function and pharyngeal sensation in comparison to last MBSS (01/03/18), however pharyngeal dysphagia still present with thin liquids in the absence of compensatory postural maneuvers. Limited anterior/superior hyolaryngeal movement and intermittent swallow initiation at the pyriform sinuses resulted in penetration and aspiration of thin liquids. Use of a straw to deliver thin bolus was noted to exacerbate the delay in swallow initiation and result in more frank aspiration. Pt's reflexive cough response provided only partial clearance of penetrates/aspirates. Although a breath hold strategy was ineffective to aid in further airway protection, implementation of chin tuck maneuver with thin proved highly effective, as only 1 instance of flash penetration was observed. Nectar thick and regular textures were also consumed without airway intrusion. SLP emphasized importance of pt's strict adherance to chin tuck strategy with small thin boluses. Recommend upgrade to regular texture, thin liquids with chin tuck and FULL supervision, NO STRAWS, take small bites/sips, continue meds whole in applesauce. ST will continue to follow acutely to provide further education and compensatory strategy training. Pt would benefit from continued home health ST upon d/c in order to ensure diet safety, efficiency, and determine readiness for repeat MBSS to assess opportunity to wean use of chin tuck.   Swallow Evaluation Recommendations       SLP Diet Recommendations: Thin liquid;Regular solids   Liquid Administration via: Cup;No straw   Medication Administration: Whole meds with  puree   Supervision: Patient able to self feed;Full supervision/cueing for compensatory strategies   Compensations: Minimize environmental distractions;Slow rate;Small sips/bites;Chin tuck   Postural Changes: Seated upright at 90 degrees   Oral Care Recommendations: Oral care BID        [place] Siren 01/11/2018,3:45 PM   Orbie Pyo [place].Ed Risk analyst [phone] Office [phone]

## 2018-01-11 NOTE — Progress Notes (Addendum)
      [address]       [employer] [address]             [phone]        5 Days Post-Op Procedure(s) (LRB): STERNAL WOUND DEBRIDEMENT (N/A) APPLICATION OF WOUND VAC (N/A)  Subjective: Patient remains with hoarsenss. She is in good spirits this am and has no specific complaints. She likes the chocolate protein shake  Objective: Vital signs in last 24 hours: Temp:  [97.7 F (36.5 C)-98.9 F (37.2 C)] 98.6 F (37 C) (11/27 0309) Pulse Rate:  [80-103] 82 (11/27 0309) Cardiac Rhythm: Normal sinus rhythm (11/27 0400) Resp:  [12-24] 17 (11/27 0309) BP: (94-130)/(49-62) 100/53 (11/27 0309) SpO2:  [85 %-100 %] 92 % (11/27 0309)   Current Weight  01/02/18 83.7 kg      Intake/Output from previous day: 11/26 0701 - 11/27 0700 In: 750 [P.O.:540; I.V.:10; IV Piggyback:200] Out: -    Physical Exam:  Cardiovascular: RRR Pulmonary: Clear bilaterally Abdomen: Soft, non tender, bowel sounds present. Wounds: Sternal wound has Praveena wound VAC.  Chest tube wound is clean and dry.  Lab Results: CBC: Recent Labs    01/09/18 0616  WBC 9.6  HGB 8.0*  HCT 27.0*  PLT 410*   BMET:  Recent Labs    01/09/18 0616 01/11/18 0217  NA 134* 134*  K 3.6 3.6  CL 96* 92*  CO2 31 32  GLUCOSE 107* 93  BUN 6* 6*  CREATININE 0.57 0.57  CALCIUM 8.6* 8.7*    PT/INR:  Lab Results  Component Value Date   INR 2.26 01/11/2018   INR 1.97 01/10/2018   INR 1.87 01/09/2018   ABG:  INR: Will add last result for INR, ABG once components are confirmed Will add last 4 CBG results once components are confirmed  Assessment/Plan:  1. CV - SR in the 80-90's. On Lopressor 25 mg bid. 2.  Pulmonary - History of COPD. On 1 liter of oxygen via [place].  Continue Mucinex for cough, Xopenex tid, and Incruse Ellipta. Encourage incentive spirometer. 3. Volume Overload - on Lasix 40 mg daily 4.  Acute blood loss anemia - Last H and H stable at 8 and 27. Continue Trinsicon 5. DVT  left IJ, subclavian, and axillary veins-on Coumadin. INR this am slightly increased to 1.97. Will give Coumadin 2 mg again tonight. 6. GI-on soft diet, nectar thick liquids 7. ID-on Vanco for sternal wound cellulitis S/p superficial sternal wound debridement, irrigation, Pravena VAC, and Acell. OR cultures no growth to date. Per Dr. Prescott Gum, will change Pravena VAC on Friday. 8. Supplement potassium  Donielle M ZimmermanPA-C 01/11/2018,7:30 AM [phone]    significant serosanguinous drainage from wound vac- cannister needs changing today Cont iv vancomycin - wound cultures from OR negative  Will change VAC in OR Friday- hope to DC home after that and do weekly VAC changes in office Leave L neck central line due to poor iv access  patient examined and medical record reviewed,agree with above note. Tharon Aquas Trigt III 01/11/2018

## 2018-01-11 NOTE — Progress Notes (Signed)
Pt left unit for radiology dept for swallow test.

## 2018-01-11 NOTE — Care Management Note (Signed)
Case Management Note  Patient Details  Name: TOYE ROUILLARD MRN: [id number] Date of Birth: 26-May-1955  Subjective/Objective:    Pt admitted with TIA                Action/Plan:  vPTA Independent from home with husband.  Pt/husband chose [facility] for HH - CM left VM for [facility]   Expected Discharge Date:                  Expected Discharge Plan:     In-House Referral:  Clinical Social Work  Discharge planning Services  CM Consult  Post Acute Care Choice:    Choice offered to:     DME Arranged:    DME Agency:     HH Arranged:  RN, Speech Therapy HH Agency:  [place]  Status of Service:  In process, will continue to follow  If discussed at Long Length of Stay Meetings, dates discussed:    Additional Comments:  Maryclare Labrador, RN 01/11/2018, 12:47 PM

## 2018-01-11 NOTE — Progress Notes (Signed)
Pharmacy Antibiotic Note  Emily Rocha is a 62 y.o. female admitted on 12/26/2017 with cellulitis.  Pharmacy has been consulted for vancomycin dosing.  WBC down to 9. Afebrile. Scr 0.6 (~CrCl 75 mL/min). Concern for sternal wound for cellulitis. Back to OR 11/29  Vancomycin level at goal (14) on 11/24. Will continue current dose.   Plan: Vancomycin 1000 mg IV every 12 hours.  Goal trough 10-15 mcg/mL.  Monitor renal fx, clinical pic, cx results, and Recheck VT as appropriate  Height: 5\' 4"  (162.6 cm) Weight: 184 lb 8.4 oz (83.7 kg) IBW/kg (Calculated) : 54.7  Temp (24hrs), Avg:98.5 F (36.9 C), Min:97.8 F (36.6 C), Max:98.9 F (37.2 C)  Recent Labs  Lab 01/05/18 0237 01/06/18 0218 01/07/18 0534 01/08/18 0348 01/08/18 1054 01/09/18 0616 01/11/18 0217  WBC 14.1* 13.8* 16.1* 9.9  --  9.6  --   CREATININE  --   --  0.64  --   --  0.57 0.57  VANCOTROUGH  --   --   --   --  14*  --   --     Estimated Creatinine Clearance: 74.4 mL/min (by C-G formula based on SCr of 0.57 mg/dL).    No Known Allergies  Antimicrobials this admission: Peri-op abx Vancomycin 11/20 >>   Dose adjustments this admission: N/A  Microbiology results: 11/14 Wound Cx: no orgs 11/22 wound: ngtd 11/23 urine- ng  Thank you for allowing pharmacy to be a part of this patient's care.  Erin Hearing PharmD., BCPS Clinical Pharmacist 01/11/2018 10:15 AM

## 2018-01-11 NOTE — Progress Notes (Signed)
  Speech Language Pathology Treatment: Dysphagia  Patient Details Name: Emily Rocha MRN: [id number] DOB: [birth year] Today's Date: 01/11/2018 Time: 0921-0932 SLP Time Calculation (min) (ACUTE ONLY): 11 min  Assessment / Plan / Recommendation Clinical Impression  Pt was pleasant and engaged throughout dysphagia treatment session today. Although still with hoarseness, pt's vocal intensity has increased since this SLP's last visit (10 days ago). Provided min verbal reminder, pt recalled and implemented recommended swallow strategy (breath hold) during consumption of nectar thick liquids. No overt s/s aspiration were observed with nectar or dysphagia 3 (mechanical soft) breakfast items and pt reports no complaints. Recommend continue dyphagia 3 diet texture deit and nectar thick liquids, meds whole in applesauce, intemermittent supervision to ensure use of safe swallow strategies (sign posted above head of pt's bed) and repeat MBS scheduled today at 1300 for opportunity to upgrade to thin liquids.    HPI HPI: Pt is a 62 year old female presented 12/26/17 for redo sternotomy with aortic to right common carotid and right subclavian artery bypass surgery - intubated for 1 day (11/11-11/12). PMH heavy tobacco use (quit 1 year ago), asthma, GERD, pneumonia, SOB, PVD, stroke. Pt also had CABG x3 1 year ago; was recovering well until presented with 2-3 episodes TIA in Oct 2019. CXR showed mild bibasilar atelectasis slightly increased on right comapred to last exam. Per chart, swallow eval requested due to concern for possible nerve damage from surgery. MBS on 12/28/17 recc D3/Honey. MBS 01/03/18 recc D3/Nectar.      SLP Plan  Continue with current plan of care       Recommendations  Diet recommendations: Dysphagia 3 (mechanical soft);Nectar-thick liquid Liquids provided via: Cup Medication Administration: Whole meds with puree Supervision: Patient able to self feed;Intermittent supervision to cue for  compensatory strategies Compensations: Minimize environmental distractions;Slow rate;Small sips/bites;Other (Comment)(breath hold) Postural Changes and/or Swallow Maneuvers: Seated upright 90 degrees                General recommendations: PT consult Oral Care Recommendations: Oral care BID Follow up Recommendations: Home health SLP SLP Visit Diagnosis: Dysphagia, unspecified (R13.10) Plan: Continue with current plan of care       Jettie Booze, Student SLP                 Jettie Booze 01/11/2018, 10:58 AM

## 2018-01-11 NOTE — Progress Notes (Signed)
   VASCULAR SURGERY ASSESSMENT & PLAN:   Steady progress.  For wound VAC change on Friday.  SUBJECTIVE:   No complaints this morning.  Continues to feel better each day.  PHYSICAL EXAM:   Vitals:   01/10/18 2200 01/10/18 2205 01/10/18 2327 01/11/18 0309  BP:   (!) 94/57 (!) 100/53  Pulse:   85 82  Resp:   17 17  Temp:   97.8 F (36.6 C) 98.6 F (37 C)  TempSrc:   Oral Oral  SpO2: (!) 85% 96% 100% 92%  Weight:      Height:       Palpable right radial pulse. VAC on chest wound.  LABS:   Lab Results  Component Value Date   WBC 9.6 01/09/2018   HGB 8.0 (L) 01/09/2018   HCT 27.0 (L) 01/09/2018   MCV 92.8 01/09/2018   PLT 410 (H) 01/09/2018   Lab Results  Component Value Date   CREATININE 0.57 01/11/2018   Lab Results  Component Value Date   INR 2.26 01/11/2018   CBG (last 3)  Recent Labs    01/10/18 1154 01/10/18 1655 01/10/18 2108  GLUCAP 117* 87 205*    PROBLEM LIST:    Active Problems:   TIA due to embolism (HCC)   CURRENT MEDS:   . atorvastatin  40 mg Oral Daily  . bisacodyl  10 mg Oral Daily  . famotidine  20 mg Oral BID  . feeding supplement (ENSURE ENLIVE)  237 mL Oral BID BM  . ferrous [id number] C-folic acid  1 capsule Oral TID PC  . furosemide  40 mg Oral Daily  . levalbuterol  0.63 mg Nebulization BID  . metoprolol tartrate  25 mg Oral BID  . mometasone-formoterol  2 puff Inhalation BID  . senna-docusate  1 tablet Oral QHS  . sodium chloride flush  10-40 mL Intracatheter Q12H  . umeclidinium bromide  1 puff Inhalation Daily  . warfarin  2 mg Oral q1800  . Warfarin - Physician Dosing Inpatient   Does not apply [place]: [phone] Office: [phone] 01/11/2018

## 2018-01-12 LAB — GLUCOSE, CAPILLARY
Glucose-Capillary: 86 mg/dL (ref 70–99)
Glucose-Capillary: 105 mg/dL — ABNORMAL HIGH (ref 70–99)
Glucose-Capillary: 198 mg/dL — ABNORMAL HIGH (ref 70–99)

## 2018-01-12 LAB — PROTIME-INR
INR: 2.25
Prothrombin Time: 24.5 seconds — ABNORMAL HIGH (ref 11.4–15.2)

## 2018-01-12 MED ORDER — CEFAZOLIN SODIUM-DEXTROSE 1-4 GM/50ML-% IV SOLN: 1.0000 g | Freq: Once | INTRAVENOUS | Status: DC

## 2018-01-12 NOTE — Progress Notes (Signed)
6 Days Post-Op Procedure(s) (LRB): STERNAL WOUND DEBRIDEMENT (N/A) APPLICATION OF WOUND VAC (N/A) Subjective: Cellulitis of sternal wound improved No sig drainage from [place] change with Acell in OR tomorrow AM Prob will send home with home VAC and oral doxycycline and follow in office INR in therapeutic range for L arm DVT related to PICC line Objective: Vital signs in last 24 hours: Temp:  [98.3 F (36.8 C)-99.2 F (37.3 C)] 99 F (37.2 C) (11/28 1050) Pulse Rate:  [77-111] 103 (11/28 1049) Cardiac Rhythm: Normal sinus rhythm (11/28 0700) Resp:  [13-29] 29 (11/28 1050) BP: (90-122)/(49-66) 90/62 (11/28 1050) SpO2:  [85 %-98 %] 94 % (11/28 1050)  Hemodynamic parameters for last 24 hours:  stable  Intake/Output from previous day: 11/27 0701 - 11/28 0700 In: 360 [P.O.:360] Out: 650 [Urine:300; Drains:350] Intake/Output this shift: Total I/O In: 300 [P.O.:300] Out: 150 [Urine:150]  Scattered rhonchi nsr Vac sponge intact  Lab Results: No results for input(s): WBC, HGB, HCT, PLT in the last 72 hours. BMET:  Recent Labs    01/11/18 0217  NA 134*  K 3.6  CL 92*  CO2 32  GLUCOSE 93  BUN 6*  CREATININE 0.57  CALCIUM 8.7*    PT/INR:  Recent Labs    01/12/18 0518  LABPROT 24.5*  INR 2.25   ABG    Component Value Date/Time   PHART 7.362 12/28/2017 0546   HCO3 29.6 (H) 12/28/2017 0546   TCO2 31 12/28/2017 0546   O2SAT 92.0 12/28/2017 0546   CBG (last 3)  Recent Labs    01/11/18 1703 01/11/18 2155 01/12/18 0800  GLUCAP 192* 141* 86    Assessment/Plan: S/P Procedure(s) (LRB): STERNAL WOUND DEBRIDEMENT (N/A) APPLICATION OF WOUND VAC (N/A) VAC change in am - procedure discussed with patient and husband   LOS: 17 days    Tharon Aquas Trigt III 01/12/2018

## 2018-01-12 NOTE — Progress Notes (Signed)
  Progress Note    01/12/2018 9:50 AM 6 Days Post-Op  Subjective: No overnight complaints  Vitals:   01/12/18 0327 01/12/18 0801  BP:  (!) 100/51  Pulse:  88  Resp:  (!) 22  Temp:  98.3 F (36.8 C)  SpO2: 95% 98%    Physical Exam: Awake alert oriented Wound VAC to suction sternum Palpable right radial pulse  CBC    Component Value Date/Time   WBC 9.6 01/09/2018 0616   RBC 2.91 (L) 01/09/2018 0616   HGB 8.0 (L) 01/09/2018 0616   HCT 27.0 (L) 01/09/2018 0616   PLT 410 (H) 01/09/2018 0616   MCV 92.8 01/09/2018 0616   MCH 27.5 01/09/2018 0616   MCHC 29.6 (L) 01/09/2018 0616   RDW 14.1 01/09/2018 0616   LYMPHSABS 0.6 (L) 12/29/2017 0606   MONOABS 0.5 12/29/2017 0606   EOSABS 0.2 12/29/2017 0606   BASOSABS 0.0 12/29/2017 0606    BMET    Component Value Date/Time   NA 134 (L) 01/11/2018 0217   NA 143 03/08/2017 1504   K 3.6 01/11/2018 0217   CL 92 (L) 01/11/2018 0217   CO2 32 01/11/2018 0217   GLUCOSE 93 01/11/2018 0217   BUN 6 (L) 01/11/2018 0217   BUN 16 03/08/2017 1504   CREATININE 0.57 01/11/2018 0217   CALCIUM 8.7 (L) 01/11/2018 0217   GFRNONAA >60 01/11/2018 0217   GFRAA >60 01/11/2018 0217    INR    Component Value Date/Time   INR 2.25 01/12/2018 0518     Intake/Output Summary (Last 24 hours) at 01/12/2018 0950 Last data filed at 01/12/2018 0600 Gross per 24 hour  Intake 0 ml  Output 650 ml  Net -650 ml     Assessment:  62 y.o. female is aorta to innominate bypass with wound VAC on sternotomy incision Plan: OR tomorrow for wound VAC change.  Vascular available as needed.   Stiven Kaspar C. Donzetta Matters, MD [facility] Office: [phone] Pager: [phone]  01/12/2018 9:50 AM

## 2018-01-13 ENCOUNTER — Encounter (HOSPITAL_COMMUNITY): Payer: Self-pay | Admitting: Certified Registered Nurse Anesthetist

## 2018-01-13 ENCOUNTER — Encounter (HOSPITAL_COMMUNITY)
Admission: RE | Disposition: A | Discharge status: Home Health | Payer: Self-pay | Source: Home / Self Care | Attending: Cardiothoracic Surgery

## 2018-01-13 ENCOUNTER — Inpatient Hospital Stay (HOSPITAL_COMMUNITY): Payer: BLUE CROSS/BLUE SHIELD | Admitting: Anesthesiology

## 2018-01-13 DIAGNOSIS — L089 Local infection of the skin and subcutaneous tissue, unspecified: Secondary | ICD-10-CM

## 2018-01-13 HISTORY — PX: APPLICATION OF WOUND VAC: SHX5189

## 2018-01-13 LAB — CBC
HCT: 24.4 % — ABNORMAL LOW (ref 36.0–46.0)
Hemoglobin: 7 g/dL — ABNORMAL LOW (ref 12.0–15.0)
MCH: 26.7 pg (ref 26.0–34.0)
MCHC: 28.7 g/dL — ABNORMAL LOW (ref 30.0–36.0)
MCV: 93.1 fL (ref 80.0–100.0)
Platelets: 373 10*3/uL (ref 150–400)
RBC: 2.62 MIL/uL — ABNORMAL LOW (ref 3.87–5.11)
RDW: 14.2 % (ref 11.5–15.5)
WBC: 7.5 10*3/uL (ref 4.0–10.5)
nRBC: 0 % (ref 0.0–0.2)

## 2018-01-13 LAB — GLUCOSE, CAPILLARY: Glucose-Capillary: 140 mg/dL — ABNORMAL HIGH (ref 70–99)

## 2018-01-13 LAB — PROTIME-INR
INR: 2.28
Prothrombin Time: 24.8 seconds — ABNORMAL HIGH (ref 11.4–15.2)

## 2018-01-13 LAB — PREPARE RBC (CROSSMATCH)

## 2018-01-13 SURGERY — APPLICATION, WOUND VAC
Anesthesia: General | Site: Chest

## 2018-01-13 MED ORDER — DOXYCYCLINE HYCLATE 100 MG PO TBEC: 100.0000 mg | DELAYED_RELEASE_TABLET | Freq: Two times a day (BID) | ORAL | 0 refills | Status: DC

## 2018-01-13 MED ORDER — PHENYLEPHRINE 40 MCG/ML (10ML) SYRINGE FOR IV PUSH (FOR BLOOD PRESSURE SUPPORT)
PREFILLED_SYRINGE | INTRAVENOUS | Status: AC
  Filled 2018-01-13: qty 10

## 2018-01-13 MED ORDER — ONDANSETRON HCL 4 MG/2ML IJ SOLN
INTRAMUSCULAR | Status: AC
  Filled 2018-01-13: qty 2

## 2018-01-13 MED ORDER — VANCOMYCIN HCL 1000 MG IV SOLR
INTRAVENOUS | Status: AC
  Administered 2018-01-13: 10:00:00
  Filled 2018-01-13 (×2): qty 1000

## 2018-01-13 MED ORDER — PROPOFOL 10 MG/ML IV BOLUS
INTRAVENOUS | Status: AC
  Filled 2018-01-13: qty 20

## 2018-01-13 MED ORDER — MIDAZOLAM HCL 5 MG/5ML IJ SOLN
INTRAMUSCULAR | Status: DC | PRN
  Administered 2018-01-13 (×2): 1 mg via INTRAVENOUS

## 2018-01-13 MED ORDER — PHENYLEPHRINE 40 MCG/ML (10ML) SYRINGE FOR IV PUSH (FOR BLOOD PRESSURE SUPPORT)
PREFILLED_SYRINGE | INTRAVENOUS | Status: DC | PRN
  Administered 2018-01-13 (×3): 80 ug via INTRAVENOUS

## 2018-01-13 MED ORDER — PROPOFOL 1000 MG/100ML IV EMUL
INTRAVENOUS | Status: AC
  Filled 2018-01-13: qty 100

## 2018-01-13 MED ORDER — ONDANSETRON HCL 4 MG/2ML IJ SOLN
INTRAMUSCULAR | Status: DC | PRN
  Administered 2018-01-13: 4 mg via INTRAVENOUS

## 2018-01-13 MED ORDER — FENTANYL CITRATE (PF) 250 MCG/5ML IJ SOLN
INTRAMUSCULAR | Status: AC
  Filled 2018-01-13: qty 5

## 2018-01-13 MED ORDER — SODIUM CHLORIDE 0.9 % IV SOLN
INTRAVENOUS | Status: AC
  Filled 2018-01-13: qty 500000

## 2018-01-13 MED ORDER — WARFARIN SODIUM 2 MG PO TABS: 2.0000 mg | ORAL_TABLET | Freq: Every day | ORAL | 1 refills | Status: DC

## 2018-01-13 MED ORDER — MIDAZOLAM HCL 2 MG/2ML IJ SOLN
INTRAMUSCULAR | Status: AC
  Filled 2018-01-13: qty 2

## 2018-01-13 MED ORDER — LACTATED RINGERS IV SOLN
INTRAVENOUS | Status: DC | PRN
  Administered 2018-01-13: 07:00:00 via INTRAVENOUS

## 2018-01-13 MED ORDER — VANCOMYCIN HCL IN DEXTROSE 1-5 GM/200ML-% IV SOLN
1000.0000 mg | Freq: Two times a day (BID) | INTRAVENOUS | Status: DC
  Filled 2018-01-13: qty 200

## 2018-01-13 MED ORDER — OXYCODONE HCL 5 MG PO TABS: ORAL_TABLET | ORAL | 0 refills | Status: DC

## 2018-01-13 MED ORDER — PROPOFOL 500 MG/50ML IV EMUL
INTRAVENOUS | Status: DC | PRN
  Administered 2018-01-13: 75 ug/kg/min via INTRAVENOUS

## 2018-01-13 MED ORDER — DEXAMETHASONE SODIUM PHOSPHATE 10 MG/ML IJ SOLN
INTRAMUSCULAR | Status: DC | PRN
  Administered 2018-01-13: 5 mg via INTRAVENOUS

## 2018-01-13 MED ORDER — SODIUM CHLORIDE 0.9 % IR SOLN
Status: DC | PRN
  Administered 2018-01-13: 1

## 2018-01-13 MED ORDER — DEXAMETHASONE SODIUM PHOSPHATE 10 MG/ML IJ SOLN
INTRAMUSCULAR | Status: AC
  Filled 2018-01-13: qty 1

## 2018-01-13 MED ORDER — FENTANYL CITRATE (PF) 100 MCG/2ML IJ SOLN
INTRAMUSCULAR | Status: DC | PRN
  Administered 2018-01-13: 50 ug via INTRAVENOUS

## 2018-01-13 SURGICAL SUPPLY — 59 items
BENZOIN TINCTURE PRP APPL 2/3 (GAUZE/BANDAGES/DRESSINGS) IMPLANT
BLADE SURG 10 STRL SS (BLADE) IMPLANT
BLADE SURG 15 STRL LF DISP TIS (BLADE) IMPLANT
BLADE SURG 15 STRL SS (BLADE)
BNDG GAUZE ELAST 4 BULKY (GAUZE/BANDAGES/DRESSINGS) IMPLANT
CANISTER SUCT 3000ML PPV (MISCELLANEOUS) ×3 IMPLANT
CLIP VESOCCLUDE SM WIDE 24/CT (CLIP) IMPLANT
CONT SPEC 4OZ CLIKSEAL STRL BL (MISCELLANEOUS) IMPLANT
COVER SURGICAL LIGHT HANDLE (MISCELLANEOUS) ×3 IMPLANT
COVER WAND RF STERILE (DRAPES) ×3 IMPLANT
DERMABOND ADHESIVE PROPEN (GAUZE/BANDAGES/DRESSINGS) ×4
DERMABOND ADVANCED (GAUZE/BANDAGES/DRESSINGS) ×2
DERMABOND ADVANCED .7 DNX12 (GAUZE/BANDAGES/DRESSINGS) ×1 IMPLANT
DERMABOND ADVANCED .7 DNX6 (GAUZE/BANDAGES/DRESSINGS) ×2 IMPLANT
DRAPE LAPAROSCOPIC ABDOMINAL (DRAPES) ×3 IMPLANT
DRAPE SLUSH/WARMER DISC (DRAPES) IMPLANT
DRSG MEPILEX BORDER 4X8 (GAUZE/BANDAGES/DRESSINGS) ×3 IMPLANT
DRSG PAD ABDOMINAL 8X10 ST (GAUZE/BANDAGES/DRESSINGS) IMPLANT
DRSG VAC ATS LRG SENSATRAC (GAUZE/BANDAGES/DRESSINGS) ×3 IMPLANT
DRSG VAC ATS MED SENSATRAC (GAUZE/BANDAGES/DRESSINGS) ×3 IMPLANT
DRSG VAC ATS SM SENSATRAC (GAUZE/BANDAGES/DRESSINGS) ×3 IMPLANT
ELECT REM PT RETURN 9FT ADLT (ELECTROSURGICAL) ×3
ELECTRODE REM PT RTRN 9FT ADLT (ELECTROSURGICAL) ×1 IMPLANT
GAUZE SPONGE 2X2 8PLY STRL LF (GAUZE/BANDAGES/DRESSINGS) ×1 IMPLANT
GAUZE SPONGE 4X4 12PLY STRL (GAUZE/BANDAGES/DRESSINGS) IMPLANT
GAUZE XEROFORM 5X9 LF (GAUZE/BANDAGES/DRESSINGS) IMPLANT
GLOVE BIO SURGEON STRL SZ7.5 (GLOVE) ×6 IMPLANT
GOWN STRL REUS W/ TWL LRG LVL3 (GOWN DISPOSABLE) ×2 IMPLANT
GOWN STRL REUS W/TWL LRG LVL3 (GOWN DISPOSABLE) ×4
HANDPIECE INTERPULSE COAX TIP (DISPOSABLE) ×2
HEMOSTAT POWDER SURGIFOAM 1G (HEMOSTASIS) IMPLANT
HEMOSTAT SURGICEL 2X14 (HEMOSTASIS) IMPLANT
KIT BASIN OR (CUSTOM PROCEDURE TRAY) ×3 IMPLANT
KIT PREVENA INCISION MGT 13 (CANNISTER) ×3 IMPLANT
KIT SUCTION CATH 14FR (SUCTIONS) IMPLANT
KIT TURNOVER KIT B (KITS) ×3 IMPLANT
MATRIX WOUND 3-LAYER 5X5 (Tissue) ×2 IMPLANT
MICROMATRIX 500MG (Tissue) ×3 IMPLANT
NS IRRIG 1000ML POUR BTL (IV SOLUTION) ×3 IMPLANT
PACK GENERAL/GYN (CUSTOM PROCEDURE TRAY) ×3 IMPLANT
PAD ARMBOARD 7.5X6 YLW CONV (MISCELLANEOUS) ×6 IMPLANT
SET HNDPC FAN SPRY TIP SCT (DISPOSABLE) ×1 IMPLANT
SOLUTION PARTIC MCRMTRX 500MG (Tissue) ×1 IMPLANT
SPONGE GAUZE 2X2 STER 10/PKG (GAUZE/BANDAGES/DRESSINGS) ×2
STAPLER VISISTAT 35W (STAPLE) IMPLANT
STRAP MONTGOMERY 1.25X11-1/8 (MISCELLANEOUS) IMPLANT
SUT ETHILON 3 0 FSL (SUTURE) IMPLANT
SUT VIC AB 1 CTX 36 (SUTURE)
SUT VIC AB 1 CTX36XBRD ANBCTR (SUTURE) IMPLANT
SUT VIC AB 2-0 CTX 27 (SUTURE) IMPLANT
SUT VIC AB 3-0 X1 27 (SUTURE) IMPLANT
SWAB COLLECTION DEVICE MRSA (MISCELLANEOUS) IMPLANT
SWAB CULTURE ESWAB REG 1ML (MISCELLANEOUS) IMPLANT
TOWEL GREEN STERILE (TOWEL DISPOSABLE) ×3 IMPLANT
TOWEL GREEN STERILE FF (TOWEL DISPOSABLE) ×3 IMPLANT
TRAY FOLEY MTR SLVR 16FR STAT (SET/KITS/TRAYS/PACK) IMPLANT
WATER STERILE IRR 1000ML POUR (IV SOLUTION) ×3 IMPLANT
WND VAC CANISTER 500ML (MISCELLANEOUS) ×3 IMPLANT
WOUND MATRIX 3-LAYER 5X5 (Tissue) ×1 IMPLANT

## 2018-01-13 NOTE — Progress Notes (Signed)
Pt completed blood transfusion, central line removed, no active bleeding noted pressure applied for 15 minutes, for 30 mins observation before home.pt aware.

## 2018-01-13 NOTE — Anesthesia Postprocedure Evaluation (Signed)
Anesthesia Post Note  [name]  Procedure(s) Performed: WOUND VAC CHANGE (N/A Chest)     Patient location during evaluation: PACU Anesthesia Type: MAC Level of consciousness: awake Pain management: pain level controlled Vital Signs Assessment: post-procedure vital signs reviewed and stable Respiratory status: spontaneous breathing Cardiovascular status: stable Postop Assessment: no apparent nausea or vomiting Anesthetic complications: no    Last Vitals:  Vitals:   01/13/18 1300 01/13/18 1315  BP: (!) 101/59   Pulse: 81   Resp: 20   Temp: 37.1 C   SpO2:  90%    Last Pain:  Vitals:   01/13/18 1300  TempSrc: Oral  PainSc:                  Davin Archuletta

## 2018-01-13 NOTE — Progress Notes (Signed)
Pt left for home in wheelchair accompanied by nurse, taken to car with husband driving.

## 2018-01-13 NOTE — Care Management Note (Signed)
Case Management Note  Patient Details  Name: TYKERRIA MCCUBBINS MRN: [id number] Date of Birth: June 07, 1955  Subjective/Objective:                    Action/Plan: Plan per MD note is for d/c later today after receiving PRBC. CM met with the patient and she states her spouse can provide assistance at home and will provide transport home.  CM updated Butch Penny with [facility] of planned d/c for today. Pt will d/c with prevena wound vac. No IV antibiotics per MD note at home.   Expected Discharge Date:                  Expected Discharge Plan:  [place]  In-House Referral:  Clinical Social Work  Discharge planning Services  CM Consult  Post Acute Care Choice:  Home Health Choice offered to:     DME Arranged:    DME Agency:     HH Arranged:  RN, Speech Therapy [place] Agency:  [facility]  Status of Service:  Completed, signed off  If discussed at [place] of Stay Meetings, dates discussed:    Additional Comments:  Pollie Friar, RN 01/13/2018, 10:55 AM

## 2018-01-13 NOTE — Anesthesia Preprocedure Evaluation (Addendum)
Anesthesia Evaluation  Patient identified by MRN, date of birth, ID band Patient awake    Reviewed: Allergy & Precautions, NPO status , Patient's Chart, lab work & pertinent test results  Airway Mallampati: II  TM Distance: >3 FB     Dental   Pulmonary shortness of breath, asthma , pneumonia, COPD, former smoker,    breath sounds clear to auscultation       Cardiovascular + angina + CAD and + Past MI   Rhythm:Regular Rate:Normal     Neuro/Psych    GI/Hepatic Neg liver ROS, GERD  ,  Endo/Other  negative endocrine ROS  Renal/GU negative Renal ROS     Musculoskeletal   Abdominal   Peds  Hematology   Anesthesia Other Findings   Reproductive/Obstetrics                             Anesthesia Physical Anesthesia Plan  ASA: IV  Anesthesia Plan: MAC   Post-op Pain Management:    Induction: Intravenous  PONV Risk Score and Plan: 3 and Treatment may vary due to age or medical condition, Midazolam, Ondansetron and Dexamethasone  Airway Management Planned: Nasal Cannula and Simple Face Mask  Additional Equipment:   Intra-op Plan:   Post-operative Plan:   Informed Consent: I have reviewed the patients History and Physical, chart, labs and discussed the procedure including the risks, benefits and alternatives for the proposed anesthesia with the patient or authorized representative who has indicated his/her understanding and acceptance.   Dental advisory given  Plan Discussed with: CRNA and Anesthesiologist  Anesthesia Plan Comments:        Anesthesia Quick Evaluation

## 2018-01-13 NOTE — Transfer of Care (Signed)
Immediate Anesthesia Transfer of Care Note  [name]  Procedure(s) Performed: WOUND VAC CHANGE (N/A Chest)  Patient Location: PACU  Anesthesia Type:MAC  Level of Consciousness: awake, alert  and oriented  Airway & Oxygen Therapy: Patient Spontanous Breathing and Patient connected to face mask oxygen  Post-op Assessment: Report given to RN and Post -op Vital signs reviewed and stable  Post vital signs: Reviewed and stable  Last Vitals:  Vitals Value Taken Time  BP 105/74 01/13/2018  8:30 AM  Temp    Pulse 95 01/13/2018  8:31 AM  Resp 15 01/13/2018  8:31 AM  SpO2 98 % 01/13/2018  8:31 AM  Vitals shown include unvalidated device data.  Last Pain:  Vitals:   01/13/18 0430  TempSrc:   PainSc: Asleep      Patients Stated Pain Goal: 3 (80/99/83 3825)  Complications: No apparent anesthesia complications

## 2018-01-13 NOTE — Anesthesia Procedure Notes (Signed)
Procedure Name: MAC Date/Time: 01/13/2018 7:30 AM Performed by: Candis Shine, CRNA Pre-anesthesia Checklist: Patient identified, Emergency Drugs available, Suction available, Patient being monitored and Timeout performed Patient Re-evaluated:Patient Re-evaluated prior to induction Oxygen Delivery Method: Simple face mask Dental Injury: Teeth and Oropharynx as per pre-operative assessment

## 2018-01-13 NOTE — Op Note (Signed)
NAME: Emily Rocha, Emily Rocha MEDICAL RECORD [id number] ACCOUNT [id number] DATE OF BIRTH:03/03/1955 FACILITY: [facility] LOCATION: [facility] PHYSICIAN: VAN TRIGT III, MD  OPERATIVE REPORT  DATE OF PROCEDURE:  01/13/2018  OPERATION: 1.  Sternal wound VAC change. 2.  Irrigation of the sternal wound. 3.  Application of ACell powder 500 mg to superficial sternal wound.  SURGEON:  Ivin Poot, MD  PREOPERATIVE DIAGNOSIS:  Superficial sternal wound infection -- dehiscence after redo sternotomy for aortic arch reconstruction.  POSTOPERATIVE DIAGNOSIS:  Superficial sternal wound infection -- dehiscence after redo sternotomy for aortic arch reconstruction.  ANESTHESIA:  Conscious sedation, monitored by anesthesia.  DESCRIPTION OF PROCEDURE:  The patient was brought to the operating room from preoperative holding where informed consent was obtained and documented and all final questions addressed.  The patient was placed supine on the operating table.  She was  started on IV monitored sedation and was asleep, but had normal ventilations.  The previous wound VAC dressing was removed.  The previous wound VAC sponge was removed.  The chest was then prepped and draped as a sterile field.  A proper time-out was  performed.  The sutures holding in the Sorbact covering were removed and then Sorbact was removed.  Under the Sorbact the superficial sternal wound was with excellent granulation tissue.  There was no evidence of erythema or cellulitis.  There were no  deep tunnels.  The previously placed ACell products had bonded with the subcutaneous tissue and filled and the defect nicely.  The wound was irrigated with sterile vancomycin irrigation.  The wound was then filled with another layer of ACell powder 500 mg.  Over that, a sheet of ACell material was placed.  Over the ACell powder and sheet, a piece of Mepitel was placed and bonded  to the skin with Dermabond.  Once this had dried and the surrounding  skin was cleaned and dried, the Prevena wound VAC sponge system was applied and connected to a suction and there was good vacuum on the sponge.  The patient was then reversed from  anesthesia and transferred to recovery in good condition.  TN/NUANCE  D:01/13/2018 T:01/13/2018 JOB:[id number]

## 2018-01-13 NOTE — Progress Notes (Signed)
Pre Procedure note for inpatients:   Emily Rocha has been scheduled for Procedure(s): WOUND VAC CHANGE (N/A) today. The various methods of treatment have been discussed with the patient. After consideration of the risks, benefits and treatment options the patient has consented to the planned procedure.   The patient has been seen and labs reviewed. There are no changes in the patient's condition to prevent proceeding with the planned procedure today.  Recent labs:  Lab Results  Component Value Date   WBC 7.5 01/13/2018   HGB 7.0 (L) 01/13/2018   HCT 24.4 (L) 01/13/2018   PLT 373 01/13/2018   GLUCOSE 93 01/11/2018   CHOL 205 (H) 12/13/2016   TRIG 101 12/13/2016   HDL 68 12/13/2016   LDLCALC 117 (H) 12/13/2016   ALT 26 01/01/2018   AST 20 01/01/2018   NA 134 (L) 01/11/2018   K 3.6 01/11/2018   CL 92 (L) 01/11/2018   CREATININE 0.57 01/11/2018   BUN 6 (L) 01/11/2018   CO2 32 01/11/2018   INR 2.28 01/13/2018   HGBA1C 6.2 (H) 12/22/2017    Len Childs, MD 01/13/2018 7:25 AM

## 2018-01-13 NOTE — Brief Op Note (Signed)
12/26/2017 - 01/13/2018  8:18 AM  PATIENT:  [name]  62 y.o. female  PRE-OPERATIVE DIAGNOSIS:  WOUND INFECTION  POST-OPERATIVE DIAGNOSIS:  * No post-op diagnosis entered *  PROCEDURE:  Procedure(s): WOUND VAC CHANGE (N/A) Preveena VAC Wound irrigation Placement of A cell powder 500mg   SURGEON:  Surgeon(s) and Role:    Ivin Poot, MD - Primary  PHYSICIAN ASSISTANT:   ASSISTANTS: none   ANESTHESIA:   IV sedation  EBL: none  BLOOD ADMINISTERED:none  DRAINS: none   LOCAL MEDICATIONS USED:  NONE  SPECIMEN:  No Specimen  DISPOSITION OF SPECIMEN:  N/A  COUNTS:  YES  TOURNIQUET:  * No tourniquets in log *  DICTATION: .Dragon Dictation  PLAN OF CARE: return to 2 C  PATIENT DISPOSITION:  PACU - hemodynamically stable.   Delay start of Pharmacological VTE agent (>24hrs) due to surgical blood loss or risk of bleeding: yes

## 2018-01-13 NOTE — Anesthesia Postprocedure Evaluation (Signed)
Anesthesia Post Note  [name]  Procedure(s) Performed: WOUND VAC CHANGE (N/A Chest)     Patient location during evaluation: PACU Anesthesia Type: General Level of consciousness: awake Pain management: pain level controlled Vital Signs Assessment: post-procedure vital signs reviewed and stable Respiratory status: spontaneous breathing Cardiovascular status: stable Postop Assessment: no apparent nausea or vomiting Anesthetic complications: no    Last Vitals:  Vitals:   01/13/18 0337 01/13/18 0830  BP: (!) 113/57 105/74  Pulse: 95 (!) 104  Resp: 17 (!) 71  Temp: 36.7 C 36.5 C  SpO2: 99% 98%    Last Pain:  Vitals:   01/13/18 0830  TempSrc:   PainSc: 0-No pain    LLE Motor Response: Purposeful movement (01/13/18 0830)   RLE Motor Response: Purposeful movement (01/13/18 0830)        Viriginia Amendola

## 2018-01-13 NOTE — Progress Notes (Signed)
Day of Surgery Procedure(s) (LRB): WOUND VAC CHANGE (N/A) Subjective: Patient had wound VAC change today in the operating room. Superficial sternal wound has excellent granulation tissue accelerated by the ACell products. Surrounding erythema cellulitis has resolved.  A new ACell layer was applied covered by the Praveena wound VAC.  The patient has anemia of chronic disease with a hemoglobin 7.0.  She would recover from her surgery better with higher hemoglobin due to her wound healing issues and chronic lung disease.  She will receive 1 unit of packed cells today. The patient will be planned on discharge to home with a [place] home VAC to remain intact until an office visit in 5 days on Wednesday, December 4 The patient will have her IV removed and transition to oral doxycycline 100 mg p.o. twice daily at discharge. The patient is taking Coumadin for DVT of her left arm related to a PICC line and this will be followed by home health nurse with dosing through the Coumadin clinic. I have discussed discharge instructions with the patient and her husband and they understand.  Objective: Vital signs in last 24 hours: Temp:  [98.1 F (36.7 C)-99.5 F (37.5 C)] 98.1 F (36.7 C) (11/29 0337) Pulse Rate:  [95-103] 95 (11/29 0337) Cardiac Rhythm: Normal sinus rhythm (11/29 0700) Resp:  [17-29] 17 (11/29 0337) BP: (90-113)/(44-62) 113/57 (11/29 0337) SpO2:  [94 %-100 %] 99 % (11/29 0337)  Hemodynamic parameters for last 24 hours:    Intake/Output from previous day: 11/28 0701 - 11/29 0700 In: 900 [P.O.:900] Out: 550 [Urine:550] Intake/Output this shift: Total I/O In: 350 [I.V.:350] Out: -   Sinus rhythm Lungs clear Abdomen soft Extremities warm Neuro intact  Lab Results: Recent Labs    01/13/18 0318  WBC 7.5  HGB 7.0*  HCT 24.4*  PLT 373   BMET:  Recent Labs    01/11/18 0217  NA 134*  K 3.6  CL 92*  CO2 32  GLUCOSE 93  BUN 6*  CREATININE 0.57  CALCIUM 8.7*     PT/INR:  Recent Labs    01/13/18 0318  LABPROT 24.8*  INR 2.28   ABG    Component Value Date/Time   PHART 7.362 12/28/2017 0546   HCO3 29.6 (H) 12/28/2017 0546   TCO2 31 12/28/2017 0546   O2SAT 92.0 12/28/2017 0546   CBG (last 3)  Recent Labs    01/12/18 0800 01/12/18 1747 01/12/18 2113  GLUCAP 86 105* 198*    Assessment/Plan: S/P Procedure(s) (LRB): WOUND VAC CHANGE (N/A) Plan discharge home with Praveena VAC later today after 1 unit packed cell transfusion Patient does not need IV antibiotics at home and the central line will be removed prior to discharge Oral doxycycline 100 mg p.o. twice daily for 14 days.   LOS: 18 days    Tharon Aquas Trigt III 01/13/2018

## 2018-01-14 LAB — TYPE AND SCREEN
ABO/RH(D): A NEG
Antibody Screen: NEGATIVE
Unit division: 0

## 2018-01-14 LAB — BPAM RBC
Blood Product Expiration Date: 201912112359
ISSUE DATE / TIME: 201911291304
Unit Type and Rh: 600

## 2018-01-16 ENCOUNTER — Encounter (HOSPITAL_COMMUNITY): Payer: Self-pay | Admitting: Cardiothoracic Surgery

## 2018-01-16 ENCOUNTER — Encounter: Payer: Self-pay | Admitting: *Deleted

## 2018-01-16 ENCOUNTER — Ambulatory Visit: Payer: Self-pay | Admitting: *Deleted

## 2018-01-16 ENCOUNTER — Other Ambulatory Visit: Payer: Self-pay

## 2018-01-16 DIAGNOSIS — S21109A Unspecified open wound of unspecified front wall of thorax without penetration into thoracic cavity, initial encounter: Secondary | ICD-10-CM | POA: Insufficient documentation

## 2018-01-16 DIAGNOSIS — G459 Transient cerebral ischemic attack, unspecified: Secondary | ICD-10-CM

## 2018-01-16 DIAGNOSIS — I749 Embolism and thrombosis of unspecified artery: Secondary | ICD-10-CM

## 2018-01-16 DIAGNOSIS — I82409 Acute embolism and thrombosis of unspecified deep veins of unspecified lower extremity: Secondary | ICD-10-CM | POA: Insufficient documentation

## 2018-01-16 DIAGNOSIS — Z5181 Encounter for therapeutic drug level monitoring: Secondary | ICD-10-CM | POA: Insufficient documentation

## 2018-01-16 LAB — POCT INR: INR: 2.3 (ref 2.0–3.0)

## 2018-01-16 NOTE — Telephone Encounter (Signed)
This encounter was created in error - please disregard.

## 2018-01-16 NOTE — Patient Instructions (Signed)
D/C from [facility] 11/29 with INR 2.28 D/C on coumadin 2mg  daily Continue coumadin 1 tablet daily.  Order given to [facility] Recheck on 12/6

## 2018-01-17 ENCOUNTER — Other Ambulatory Visit: Payer: Self-pay

## 2018-01-17 ENCOUNTER — Encounter: Payer: Self-pay | Admitting: Cardiothoracic Surgery

## 2018-01-17 ENCOUNTER — Ambulatory Visit
Admission: RE | Admit: 2018-01-17 | Discharge: 2018-01-17 | Disposition: A | Discharge status: Home / Self Care | Payer: BLUE CROSS/BLUE SHIELD | Source: Ambulatory Visit | Attending: Cardiothoracic Surgery | Admitting: Cardiothoracic Surgery

## 2018-01-17 ENCOUNTER — Ambulatory Visit (INDEPENDENT_AMBULATORY_CARE_PROVIDER_SITE_OTHER): Payer: BLUE CROSS/BLUE SHIELD | Admitting: Cardiothoracic Surgery

## 2018-01-17 ENCOUNTER — Other Ambulatory Visit: Payer: Self-pay | Admitting: Cardiothoracic Surgery

## 2018-01-17 VITALS — BP 108/71 | HR 100 | Resp 22 | Ht 66.0 in | Wt 193.2 lb

## 2018-01-17 DIAGNOSIS — S21109A Unspecified open wound of unspecified front wall of thorax without penetration into thoracic cavity, initial encounter: Secondary | ICD-10-CM

## 2018-01-17 DIAGNOSIS — R0602 Shortness of breath: Secondary | ICD-10-CM

## 2018-01-17 DIAGNOSIS — R918 Other nonspecific abnormal finding of lung field: Secondary | ICD-10-CM

## 2018-01-17 DIAGNOSIS — J431 Panlobular emphysema: Secondary | ICD-10-CM

## 2018-01-17 MED ORDER — OXYCODONE HCL 5 MG PO TABS: ORAL_TABLET | ORAL | 0 refills | Status: DC

## 2018-01-17 NOTE — Progress Notes (Signed)
Patient ID: EVERLINA GOTTS, female   DOB: 1956-01-16, 62 y.o.   MRN: [id number] SIX MIN WALK 01/17/2018  Supplimental Oxygen during Test? (L/min) Yes  O2 Flow Rate 3  Type Continuous  Laps 5  Baseline BP (sitting) 108/71  Baseline Heartrate 100  Baseline SPO2 90  Heartrate 110  SPO2 83  SPO2 97  Stopped or Paused before Six Minutes Yes  Other Symptoms at end of Exercise fatiqued

## 2018-01-17 NOTE — Progress Notes (Signed)
PCP is Glenda Chroman, MD Referring Provider is Glenda Chroman, MD  Chief Complaint  Patient presents with  . Routine Post Op    assess wound management    HPI: Patient returns for office visit to check sternal wound.  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  She has severe COPD as well as a recently diagnosed spiculated right upper lobe mass.  Vascular surgery was expedited because of her ongoing active TIAs.  Following redo sternotomy she had severe coughing and hoarseness from possible recurrent right laryngeal paresis.  Her superficial soft tissue of the sternal incision dehisced and required debridement.  Cultures were negative and a wound VAC was placed.  She was discharged home on wound VAC and oral doxycycline.  After 48 hours the wound VAC stopped working.  She has had increased left chest wall pain probably from a malunion of a costochondral junction on the left.  She has increased shortness of breath and work of breathing and saturation on on room air drops to 83% with ambulation.  She is coughing up thick mucoid secretions.  She is on chronic Coumadin for DVT of her left arm related to PICC line placement in the hospital. A new Praveena wound VAC was applied in the office today with a charger provided to the patient.  Past Medical History:  Diagnosis Date  . Asthma   . COPD (chronic obstructive pulmonary disease) ([place])   . Coronary artery disease    hx CABG  . GERD (gastroesophageal reflux disease)   . Pneumonia   . Shortness of breath dyspnea   . Stroke [facility])    "mini stroke" numbness and weakness to left side of the body    Past Surgical History:  Procedure Laterality Date  . AORTIC ARCH ANGIOGRAPHY N/A 11/04/2017   Procedure: AORTIC ARCH ANGIOGRAPHY;  Surgeon: Elam Dutch, MD;  Location: [place] [facility];  Service: Cardiovascular;   Laterality: N/A;  . APPENDECTOMY    . APPLICATION OF WOUND VAC N/A 01/06/2018   Procedure: APPLICATION OF WOUND VAC;  Surgeon: Ivin Poot, MD;  Location: [place];  Service: Thoracic;  Laterality: N/A;  . APPLICATION OF WOUND VAC N/A 01/13/2018   Procedure: WOUND VAC CHANGE;  Surgeon: Ivin Poot, MD;  Location: [place];  Service: Thoracic;  Laterality: N/A;  . CAROTID-SUBCLAVIAN BYPASS GRAFT Right 12/26/2017   Procedure: AORTIC TO RIGHT COMMON CAROTID AND RIGHT SUBCLAVIAN  ARTERY  BYPASS;  Surgeon: Elam Dutch, MD;  Location: [place];  Service: Vascular;  Laterality: Right;  . CHOLECYSTECTOMY    . CLOSURE OF DIAPHRAGM  12/26/2017   Procedure: REPAIR OF DIAPHRAGM;  Surgeon: Ivin Poot, MD;  Location: [place];  Service: Thoracic;;  . COLONOSCOPY N/A 10/03/2014   Procedure: COLONOSCOPY;  Surgeon: Rogene Houston, MD;  [facility];  Service: Endoscopy;  Laterality: N/A;  730  . CORONARY ARTERY BYPASS GRAFT N/A 12/14/2016   Procedure: CORONARY ARTERY BYPASS GRAFTING (CABG) x three , using left internal mammary artery and right leg greater saphenous vein harvested endoscopically;  Surgeon: Ivin Poot, MD;  Location: [place];  Service: Open Heart Surgery;  Laterality: N/A;  . ENDARTERECTOMY Right 12/06/2014   Procedure: ENDARTERECTOMY CAROTid;  Surgeon: Elam Dutch, MD;  Location: [place];  Service: Vascular;  Laterality: Right;  . ESOPHAGOGASTRODUODENOSCOPY N/A 12/03/2016  Procedure: ESOPHAGOGASTRODUODENOSCOPY (EGD);  Surgeon: Rogene Houston, MD;  [facility];  Service: Endoscopy;  Laterality: N/A;  7:30  . LEFT HEART CATH AND CORONARY ANGIOGRAPHY N/A 12/14/2016   Procedure: LEFT HEART CATH AND CORONARY ANGIOGRAPHY;  Surgeon: Burnell Blanks, MD;  Location: [place] [facility];  Service: Cardiovascular;  Laterality: N/A;  . PERIPHERAL VASCULAR CATHETERIZATION N/A 12/05/2014   Procedure:  Carotid Arch Angiography;  Surgeon: Conrad Keswick, MD;   Location: [place] [facility];  Service: Cardiovascular;  Laterality: N/A;  . STERNAL WOUND DEBRIDEMENT N/A 01/06/2018   Procedure: STERNAL WOUND DEBRIDEMENT;  Surgeon: Ivin Poot, MD;  Location: [place];  Service: Thoracic;  Laterality: N/A;  . STERNOTOMY N/A 12/26/2017   Procedure: REDO STERNOTOMY;  Surgeon: Prescott Gum, Collier Salina, MD;  Location: [place];  Service: Thoracic;  Laterality: N/A;  . TEE WITHOUT CARDIOVERSION N/A 12/14/2016   Procedure: TRANSESOPHAGEAL ECHOCARDIOGRAM (TEE);  Surgeon: Prescott Gum, Collier Salina, MD;  Location: [place];  Service: Open Heart Surgery;  Laterality: N/A;    Family History  Problem Relation Age of Onset  . Heart attack Mother   . Cancer Mother   . Diabetes Mother   . Heart disease Mother   . Heart attack Father   . Heart disease Father     Social History Social History   Tobacco Use  . Smoking status: Former Smoker    Packs/day: 2.00    Years: 40.00    Pack years: 80.00  . Smokeless tobacco: Never Used  Substance Use Topics  . Alcohol use: No    Alcohol/week: 0.0 standard drinks  . Drug use: No    Current Outpatient Medications  Medication Sig Dispense Refill  . ALPRAZolam (XANAX) 1 MG tablet Take 1 mg by mouth 3 (three) times daily as needed for anxiety.     Marland Kitchen aspirin 81 MG chewable tablet Chew 81 mg by mouth daily.    Marland Kitchen atorvastatin (LIPITOR) 40 MG tablet Take 1 tablet (40 mg total) daily by mouth. 30 tablet 1  . Cholecalciferol (VITAMIN D) 2000 units CAPS Take 2,000 Units by mouth daily.    Marland Kitchen doxycycline (DORYX) 100 MG EC tablet Take 1 tablet (100 mg total) by mouth 2 (two) times daily. 20 tablet 0  . metoprolol succinate (TOPROL-XL) 50 MG 24 hr tablet Take 50 mg by mouth daily. Take with or immediately following a meal.    . Multiple Vitamin (MULTIVITAMIN WITH MINERALS) TABS tablet Take 1 tablet by mouth daily.    Marland Kitchen oxyCODONE (OXY IR/ROXICODONE) 5 MG immediate release tablet Take 5 mg by mouth every 4 hours PRN severe pain 30 tablet 0  .  pantoprazole (PROTONIX) 40 MG tablet Take 40 mg by mouth daily.    . potassium chloride SA (K-DUR,KLOR-CON) 20 MEQ tablet Take 20 mEq by mouth 2 (two) times daily.   2  . SYMBICORT 160-4.5 MCG/ACT inhaler Inhale 2 puffs into the lungs 2 (two) times daily.   4  . torsemide (DEMADEX) 20 MG tablet Take 2 tablets (40 mg total) by mouth daily. 120 tablet 1  . VENTOLIN HFA 108 (90 BASE) MCG/ACT inhaler Inhale 2 puffs into the lungs every 6 (six) hours as needed for wheezing or shortness of breath.   0  . warfarin (COUMADIN) 2 MG tablet Take 1 tablet (2 mg total) by mouth daily at 6 PM. 30 tablet 1   No current facility-administered medications for this visit.     No Known Allergies  Review of Systems  Persistent cough Left chest wall pain No fever No falls at home Shortness of breath with exertion.  Patient not on home oxygen. BP 108/71 (BP Location: Left Arm, Patient Position: Sitting, Cuff Size: Large)   Pulse 100   Resp (!) 22   Ht 5\' 6"  (1.676 m)   Wt 193 lb 3.2 oz (87.6 kg)   SpO2 90% Comment: ON RA  BMI 31.18 kg/m  Physical Exam      Exam    General- alert and comfortable at rest    Neck- no JVD, no cervical adenopathy palpable, no carotid bruit   Lungs-scattered rhonchi but equal Sternal wound -ACell material being incorporated.  No surrounding erythema.   Cor- regular rate and rhythm, no murmur , gallop   Abdomen- soft, non-tender   Extremities - warm, non-tender, minimal edema   Neuro- oriented, appropriate, no focal weakness   Diagnostic Tests:  Chest x-ray pending  Impression: Superficial sternal wound dehiscence after redo sternotomy for arch reconstruction. Spiculated right upper lobe mass consistent with primary cancer which will need to be further evaluated after he recovers from this operation Severe COPD now initiating home oxygen therapy for desaturation to 83% with exertion History of emergency CABG for MI, cardiogenic shock, preoperative balloon pump 1  year ago  Plan: Patient was prescribed Tussionex to help with her cough and will take a course of azithromycin.  She will keep the new Praveena wound VAC to suction until her next wound check in a week.   Len Childs, MD [facility] [phone]

## 2018-01-18 ENCOUNTER — Encounter: Payer: BLUE CROSS/BLUE SHIELD | Admitting: Cardiothoracic Surgery

## 2018-01-20 ENCOUNTER — Telehealth: Payer: Self-pay | Admitting: Student

## 2018-01-20 ENCOUNTER — Telehealth: Payer: Self-pay

## 2018-01-20 NOTE — Progress Notes (Signed)
This encounter was created in error - please disregard.

## 2018-01-20 NOTE — Telephone Encounter (Signed)
Patient called and stated she never got her prescription for z-pack as prescribed by Dr. Prescott Gum at her last appointment.  According to Dr. Lucianne Lei Trigt's note, she was going to have azithromycin called into her pharmacy.  Pharmacy does not have the prescription on file.   Z-pack 5 day dose pack called into the [facility] on [address] In [place].  No refills.  [name], [facility] also stated that patient was taking coumadin, possibly not as directed as she took one tablet last night and then took another table this morning.  [name] stated that the coumadin clinic where her INR is managed is closed today.  Advised to contact her Cardiologist, Dr. Harl Bowie concerning the matter.  She acknowledged receipt.

## 2018-01-20 NOTE — Telephone Encounter (Signed)
    Received page from answering service from [place], Emily Rocha. Patient on Coumadin 2mg  nightly for recent diagnosis of DVT of upper extremity. INR has been therapeutic and was 2.3 on 01/16/2018. However, INR 1.5/PT 18.2 today. Called and spoke with [facility] nurse. Patient reportedly took 2mg  last night and then 2mg  this morning around 10am because she "didn't think it mattered what time she took her medicine." Discussed with pharmacy who recommended taking another 2mg  tonight at the usual time and then 4mg  tomorrow night (01/21/2018). On 01/22/2018, she is to return to her regular dose of 2mg  nightly. Home Health Care nurse to relay this to patient and then will recheck her INR on Tuesday 01/24/2018.  Home Health Care nurse voice understanding and states she would inform patient. She has also emphasized to the patient the importance of taking her medication as directed.   Darreld Mclean, PA-C 01/20/2018 1:19 PM

## 2018-01-24 ENCOUNTER — Ambulatory Visit (INDEPENDENT_AMBULATORY_CARE_PROVIDER_SITE_OTHER): Payer: BLUE CROSS/BLUE SHIELD | Admitting: *Deleted

## 2018-01-24 LAB — POCT INR: INR: 3 (ref 2.0–3.0)

## 2018-01-24 NOTE — Patient Instructions (Signed)
INR was checked on 01/20/18 by [facility].  INR was 1.5 and called to on call MD. Pt took 4mg  on 12/6 & 12/7 then resumed 2mg  daily INR today 3.0  Continue coumadin 2mg  daily.  Order given to [facility] Recheck on 12/12

## 2018-01-25 ENCOUNTER — Other Ambulatory Visit: Payer: Self-pay

## 2018-01-25 ENCOUNTER — Encounter: Payer: Self-pay | Admitting: Cardiothoracic Surgery

## 2018-01-25 ENCOUNTER — Telehealth: Payer: Self-pay

## 2018-01-25 ENCOUNTER — Ambulatory Visit (INDEPENDENT_AMBULATORY_CARE_PROVIDER_SITE_OTHER): Payer: Self-pay | Admitting: Cardiothoracic Surgery

## 2018-01-25 VITALS — BP 142/78 | HR 116 | Resp 22 | Ht 66.0 in | Wt 193.0 lb

## 2018-01-25 DIAGNOSIS — S21109A Unspecified open wound of unspecified front wall of thorax without penetration into thoracic cavity, initial encounter: Secondary | ICD-10-CM

## 2018-01-25 DIAGNOSIS — Z951 Presence of aortocoronary bypass graft: Secondary | ICD-10-CM

## 2018-01-25 NOTE — Progress Notes (Signed)
PCP is Glenda Chroman, MD Referring Provider is Glenda Chroman, MD  Chief Complaint  Patient presents with  . Follow-up    wound check, sternal    HPI: Wound check for sternal wound superficial nonhealing after redo sternotomy for aortic arch reconstruction performed 11-11 with Dr. Oneida Alar  COPD on home oxygen  11 mm spiculated right upper lobe nodule noted on CT scan prior to redo sternotomy and arch reconstruction  The sternal wound is granulating in nicely and the wound VAC will be discontinued and we will transition to home health saline wet-to-dry dressings.  Cultures have been negative and there is no evidence of cellulitis.  The patient's upper respiratory infection has improved with Z-Pak. She will take Mucinex as needed for a cough with thin white sputum  She will be followed in the office for weekly wound checks at this point to monitor sternal wound healing.  After recovery from the surgery the patient will need a repeat full CT scan of the chest and PET scan to assess the newly diagnosed right upper lobe 11 mm spiculated nodule in a patient with strong smoking history.  Past Medical History:  Diagnosis Date  . Asthma   . COPD (chronic obstructive pulmonary disease) ([place])   . Coronary artery disease    hx CABG  . GERD (gastroesophageal reflux disease)   . Pneumonia   . Shortness of breath dyspnea   . Stroke [facility])    "mini stroke" numbness and weakness to left side of the body    Past Surgical History:  Procedure Laterality Date  . AORTIC ARCH ANGIOGRAPHY N/A 11/04/2017   Procedure: AORTIC ARCH ANGIOGRAPHY;  Surgeon: Elam Dutch, MD;  Location: [place] [facility];  Service: Cardiovascular;  Laterality: N/A;  . APPENDECTOMY    . APPLICATION OF WOUND VAC N/A 01/06/2018   Procedure: APPLICATION OF WOUND VAC;  Surgeon: Ivin Poot, MD;  Location: [place];  Service: Thoracic;  Laterality: N/A;  . APPLICATION OF WOUND VAC N/A 01/13/2018   Procedure: WOUND VAC  CHANGE;  Surgeon: Ivin Poot, MD;  Location: [place];  Service: Thoracic;  Laterality: N/A;  . CAROTID-SUBCLAVIAN BYPASS GRAFT Right 12/26/2017   Procedure: AORTIC TO RIGHT COMMON CAROTID AND RIGHT SUBCLAVIAN  ARTERY  BYPASS;  Surgeon: Elam Dutch, MD;  Location: [place];  Service: Vascular;  Laterality: Right;  . CHOLECYSTECTOMY    . CLOSURE OF DIAPHRAGM  12/26/2017   Procedure: REPAIR OF DIAPHRAGM;  Surgeon: Ivin Poot, MD;  Location: [place];  Service: Thoracic;;  . COLONOSCOPY N/A 10/03/2014   Procedure: COLONOSCOPY;  Surgeon: Rogene Houston, MD;  [facility];  Service: Endoscopy;  Laterality: N/A;  730  . CORONARY ARTERY BYPASS GRAFT N/A 12/14/2016   Procedure: CORONARY ARTERY BYPASS GRAFTING (CABG) x three , using left internal mammary artery and right leg greater saphenous vein harvested endoscopically;  Surgeon: Ivin Poot, MD;  Location: [place];  Service: Open Heart Surgery;  Laterality: N/A;  . ENDARTERECTOMY Right 12/06/2014   Procedure: ENDARTERECTOMY CAROTid;  Surgeon: Elam Dutch, MD;  [facility];  Service: Vascular;  Laterality: Right;  . ESOPHAGOGASTRODUODENOSCOPY N/A 12/03/2016   Procedure: ESOPHAGOGASTRODUODENOSCOPY (EGD);  Surgeon: Rogene Houston, MD;  [facility];  Service: Endoscopy;  Laterality: N/A;  7:30  . LEFT HEART CATH AND CORONARY ANGIOGRAPHY N/A 12/14/2016   Procedure: LEFT HEART CATH AND CORONARY ANGIOGRAPHY;  Surgeon: Burnell Blanks, MD;  Location: [facility]  INVASIVE CV [facility];  Service: Cardiovascular;  Laterality: N/A;  . PERIPHERAL VASCULAR CATHETERIZATION N/A 12/05/2014   Procedure:  Carotid Arch Angiography;  Surgeon: Conrad [place] Trails, MD;  Location: [place] [facility];  Service: Cardiovascular;  Laterality: N/A;  . STERNAL WOUND DEBRIDEMENT N/A 01/06/2018   Procedure: STERNAL WOUND DEBRIDEMENT;  Surgeon: Ivin Poot, MD;  Location: [place];  Service: Thoracic;  Laterality: N/A;  . STERNOTOMY N/A 12/26/2017    Procedure: REDO STERNOTOMY;  Surgeon: Prescott Gum, Collier Salina, MD;  Location: [place];  Service: Thoracic;  Laterality: N/A;  . TEE WITHOUT CARDIOVERSION N/A 12/14/2016   Procedure: TRANSESOPHAGEAL ECHOCARDIOGRAM (TEE);  Surgeon: Prescott Gum, Collier Salina, MD;  Location: [place];  Service: Open Heart Surgery;  Laterality: N/A;    Family History  Problem Relation Age of Onset  . Heart attack Mother   . Cancer Mother   . Diabetes Mother   . Heart disease Mother   . Heart attack Father   . Heart disease Father     Social History Social History   Tobacco Use  . Smoking status: Former Smoker    Packs/day: 2.00    Years: 40.00    Pack years: 80.00  . Smokeless tobacco: Never Used  Substance Use Topics  . Alcohol use: No    Alcohol/week: 0.0 standard drinks  . Drug use: No    Current Outpatient Medications  Medication Sig Dispense Refill  . ALPRAZolam (XANAX) 1 MG tablet Take 1 mg by mouth 3 (three) times daily as needed for anxiety.     Marland Kitchen aspirin 81 MG chewable tablet Chew 81 mg by mouth daily.    Marland Kitchen atorvastatin (LIPITOR) 40 MG tablet Take 1 tablet (40 mg total) daily by mouth. 30 tablet 1  . Cholecalciferol (VITAMIN D) 2000 units CAPS Take 2,000 Units by mouth daily.    Marland Kitchen doxycycline (DORYX) 100 MG EC tablet Take 1 tablet (100 mg total) by mouth 2 (two) times daily. 20 tablet 0  . metoprolol succinate (TOPROL-XL) 50 MG 24 hr tablet Take 50 mg by mouth daily. Take with or immediately following a meal.    . Multiple Vitamin (MULTIVITAMIN WITH MINERALS) TABS tablet Take 1 tablet by mouth daily.    Marland Kitchen oxyCODONE (OXY IR/ROXICODONE) 5 MG immediate release tablet Take 5 mg by mouth every 4 hours PRN severe pain 30 tablet 0  . pantoprazole (PROTONIX) 40 MG tablet Take 40 mg by mouth daily.    . potassium chloride SA (K-DUR,KLOR-CON) 20 MEQ tablet Take 20 mEq by mouth 2 (two) times daily.   2  . SYMBICORT 160-4.5 MCG/ACT inhaler Inhale 2 puffs into the lungs 2 (two) times daily.   4  . torsemide (DEMADEX) 20  MG tablet Take 2 tablets (40 mg total) by mouth daily. 120 tablet 1  . VENTOLIN HFA 108 (90 BASE) MCG/ACT inhaler Inhale 2 puffs into the lungs every 6 (six) hours as needed for wheezing or shortness of breath.   0  . warfarin (COUMADIN) 2 MG tablet Take 1 tablet (2 mg total) by mouth daily at 6 PM. 30 tablet 1   No current facility-administered medications for this visit.     No Known Allergies  Review of Systems  Complains of poor appetite  BP (!) 142/78 (BP Location: Left Arm, Patient Position: Sitting, Cuff Size: Normal)   Pulse (!) 116   Resp (!) 22   Ht 5\' 6"  (1.676 m)   Wt 193 lb (87.5 kg)   SpO2 95%  Comment: RA  BMI 31.15 kg/m  Physical Exam Sternal wound clean and granulating.  No deep sinuses.  Wound VAC no longer needed and will transition to wet-to-dry dressing changes Neurologic exam intact Lungs clear  Diagnostic Tests: None  Impression: Healing sternal wound from superficial dehiscence related to repeated coughing from her COPD.  Plan: Home health nurse will provide saline wet-to-dry dressings and we will check her on a weekly basis in the office.   Len Childs, MD [facility] [phone]

## 2018-01-25 NOTE — Telephone Encounter (Signed)
Emily Rocha, nurse with [facility] [phone] with new orders per Dr. Prescott Gum regarding Emily Rocha's sternal wound.  Patient is to have daily wet to dry dressing changes per Dr. Prescott Gum.  Also, patient's husband can be taught and change dressings per Dr. Prescott Gum.  [name] was made aware and will await faxed copy to be signed by physician.  Emily Rocha is aware of her follow-up appointment with Dr. Prescott Gum next week.

## 2018-01-26 ENCOUNTER — Ambulatory Visit (INDEPENDENT_AMBULATORY_CARE_PROVIDER_SITE_OTHER): Payer: BLUE CROSS/BLUE SHIELD | Admitting: *Deleted

## 2018-01-26 LAB — POCT INR: INR: 3.8 — AB (ref 2.0–3.0)

## 2018-01-26 NOTE — Patient Instructions (Signed)
Hold coumadin tonight, take 1mg  Friday night, take 2mg  Saturday and Sunday and recheck on Monday Order given to [place] on 12/16

## 2018-01-30 ENCOUNTER — Ambulatory Visit (INDEPENDENT_AMBULATORY_CARE_PROVIDER_SITE_OTHER): Payer: BLUE CROSS/BLUE SHIELD | Admitting: *Deleted

## 2018-01-30 DIAGNOSIS — G459 Transient cerebral ischemic attack, unspecified: Secondary | ICD-10-CM | POA: Diagnosis not present

## 2018-01-30 DIAGNOSIS — Z5181 Encounter for therapeutic drug level monitoring: Secondary | ICD-10-CM | POA: Diagnosis not present

## 2018-01-30 DIAGNOSIS — I749 Embolism and thrombosis of unspecified artery: Secondary | ICD-10-CM

## 2018-01-30 LAB — POCT INR: INR: 2.1 (ref 2.0–3.0)

## 2018-01-30 NOTE — Patient Instructions (Signed)
Start coumadin 2mg  daily except 1mg  on Sundays, Tuesdays and Thursdays Order given to [place] on 12/23

## 2018-01-31 ENCOUNTER — Other Ambulatory Visit: Payer: Self-pay | Admitting: Cardiothoracic Surgery

## 2018-01-31 DIAGNOSIS — Z951 Presence of aortocoronary bypass graft: Secondary | ICD-10-CM

## 2018-02-01 ENCOUNTER — Encounter: Payer: Self-pay | Admitting: Cardiothoracic Surgery

## 2018-02-01 ENCOUNTER — Ambulatory Visit
Admission: RE | Admit: 2018-02-01 | Discharge: 2018-02-01 | Disposition: A | Discharge status: Home / Self Care | Payer: BLUE CROSS/BLUE SHIELD | Source: Ambulatory Visit | Attending: Cardiothoracic Surgery | Admitting: Cardiothoracic Surgery

## 2018-02-01 ENCOUNTER — Ambulatory Visit (INDEPENDENT_AMBULATORY_CARE_PROVIDER_SITE_OTHER): Payer: Self-pay | Admitting: Cardiothoracic Surgery

## 2018-02-01 ENCOUNTER — Other Ambulatory Visit: Payer: Self-pay

## 2018-02-01 VITALS — BP 133/88 | HR 122 | Resp 18 | Ht 66.0 in | Wt 175.0 lb

## 2018-02-01 DIAGNOSIS — T8131XD Disruption of external operation (surgical) wound, not elsewhere classified, subsequent encounter: Secondary | ICD-10-CM

## 2018-02-01 DIAGNOSIS — Z951 Presence of aortocoronary bypass graft: Secondary | ICD-10-CM

## 2018-02-01 MED ORDER — ONDANSETRON 4 MG PO TBDP: 4.0000 mg | ORAL_TABLET | Freq: Three times a day (TID) | ORAL | 0 refills | Status: DC | PRN

## 2018-02-01 NOTE — Progress Notes (Signed)
PCP is Glenda Chroman, MD Referring Provider is Glenda Chroman, MD  Chief Complaint  Patient presents with  . Follow-up    sternal wound check, s/p redo sternotomy    HPI: Patient returns for a wound check The superficial sternal wound is granulating in nicely. Surrounding erythema has resolved. She has completed her course of oral Keflex Husband is completing daily wet-to-dry dressing changes with a 2 x 2 saline moistened gauze She is breathing more comfortably on home oxygen  Image of chest x-ray taken today is personally reviewed showing no pleural effusion, sternal wires intact   Past Medical History:  Diagnosis Date  . Asthma   . COPD (chronic obstructive pulmonary disease) ([place])   . Coronary artery disease    hx CABG  . GERD (gastroesophageal reflux disease)   . Pneumonia   . Shortness of breath dyspnea   . Stroke [facility])    "mini stroke" numbness and weakness to left side of the body    Past Surgical History:  Procedure Laterality Date  . AORTIC ARCH ANGIOGRAPHY N/A 11/04/2017   Procedure: AORTIC ARCH ANGIOGRAPHY;  Surgeon: Elam Dutch, MD;  Location: [place] CV LAB;  Service: Cardiovascular;  Laterality: N/A;  . APPENDECTOMY    . APPLICATION OF WOUND VAC N/A 01/06/2018   Procedure: APPLICATION OF WOUND VAC;  Surgeon: Ivin Poot, MD;  Location: [place];  Service: Thoracic;  Laterality: N/A;  . APPLICATION OF WOUND VAC N/A 01/13/2018   Procedure: WOUND VAC CHANGE;  Surgeon: Ivin Poot, MD;  Location: [place];  Service: Thoracic;  Laterality: N/A;  . CAROTID-SUBCLAVIAN BYPASS GRAFT Right 12/26/2017   Procedure: AORTIC TO RIGHT COMMON CAROTID AND RIGHT SUBCLAVIAN  ARTERY  BYPASS;  Surgeon: Elam Dutch, MD;  Location: [place];  Service: Vascular;  Laterality: Right;  . CHOLECYSTECTOMY    . CLOSURE OF DIAPHRAGM  12/26/2017   Procedure: REPAIR OF DIAPHRAGM;  Surgeon: Ivin Poot, MD;  Location: [place];  Service: Thoracic;;  . COLONOSCOPY N/A  10/03/2014   Procedure: COLONOSCOPY;  Surgeon: Rogene Houston, MD;  [facility];  Service: Endoscopy;  Laterality: N/A;  730  . CORONARY ARTERY BYPASS GRAFT N/A 12/14/2016   Procedure: CORONARY ARTERY BYPASS GRAFTING (CABG) x three , using left internal mammary artery and right leg greater saphenous vein harvested endoscopically;  Surgeon: Ivin Poot, MD;  Location: [place];  Service: Open Heart Surgery;  Laterality: N/A;  . ENDARTERECTOMY Right 12/06/2014   Procedure: ENDARTERECTOMY CAROTid;  Surgeon: Elam Dutch, MD;  [facility];  Service: Vascular;  Laterality: Right;  . ESOPHAGOGASTRODUODENOSCOPY N/A 12/03/2016   Procedure: ESOPHAGOGASTRODUODENOSCOPY (EGD);  Surgeon: Rogene Houston, MD;  [facility];  Service: Endoscopy;  Laterality: N/A;  7:30  . LEFT HEART CATH AND CORONARY ANGIOGRAPHY N/A 12/14/2016   Procedure: LEFT HEART CATH AND CORONARY ANGIOGRAPHY;  Surgeon: Burnell Blanks, MD;  Location: [place] CV LAB;  Service: Cardiovascular;  Laterality: N/A;  . PERIPHERAL VASCULAR CATHETERIZATION N/A 12/05/2014   Procedure:  Carotid Arch Angiography;  Surgeon: Conrad Menominee, MD;  Location: [place] CV LAB;  Service: Cardiovascular;  Laterality: N/A;  . STERNAL WOUND DEBRIDEMENT N/A 01/06/2018   Procedure: STERNAL WOUND DEBRIDEMENT;  Surgeon: Ivin Poot, MD;  Location: [place];  Service: Thoracic;  Laterality: N/A;  . STERNOTOMY N/A 12/26/2017   Procedure: REDO STERNOTOMY;  Surgeon: Ivin Poot, MD;  Location: [place];  Service: Thoracic;  Laterality: N/A;  . TEE WITHOUT CARDIOVERSION N/A 12/14/2016   Procedure: TRANSESOPHAGEAL ECHOCARDIOGRAM (TEE);  Surgeon: Prescott Gum, Collier Salina, MD;  Location: [place];  Service: Open Heart Surgery;  Laterality: N/A;    Family History  Problem Relation Age of Onset  . Heart attack Mother   . Cancer Mother   . Diabetes Mother   . Heart disease Mother   . Heart attack Father   . Heart disease Father      Social History Social History   Tobacco Use  . Smoking status: Former Smoker    Packs/day: 2.00    Years: 40.00    Pack years: 80.00  . Smokeless tobacco: Never Used  Substance Use Topics  . Alcohol use: No    Alcohol/week: 0.0 standard drinks  . Drug use: No    Current Outpatient Medications  Medication Sig Dispense Refill  . ALPRAZolam (XANAX) 1 MG tablet Take 1 mg by mouth 3 (three) times daily as needed for anxiety.     Marland Kitchen aspirin 81 MG chewable tablet Chew 81 mg by mouth daily.    Marland Kitchen atorvastatin (LIPITOR) 40 MG tablet Take 1 tablet (40 mg total) daily by mouth. 30 tablet 1  . Cholecalciferol (VITAMIN D) 2000 units CAPS Take 2,000 Units by mouth daily.    . metoprolol succinate (TOPROL-XL) 50 MG 24 hr tablet Take 50 mg by mouth daily. Take with or immediately following a meal.    . Multiple Vitamin (MULTIVITAMIN WITH MINERALS) TABS tablet Take 1 tablet by mouth daily.    Marland Kitchen oxyCODONE (OXY IR/ROXICODONE) 5 MG immediate release tablet Take 5 mg by mouth every 4 hours PRN severe pain 30 tablet 0  . pantoprazole (PROTONIX) 40 MG tablet Take 40 mg by mouth daily.    . potassium chloride SA (K-DUR,KLOR-CON) 20 MEQ tablet Take 20 mEq by mouth 2 (two) times daily.   2  . SYMBICORT 160-4.5 MCG/ACT inhaler Inhale 2 puffs into the lungs 2 (two) times daily.   4  . torsemide (DEMADEX) 20 MG tablet Take 2 tablets (40 mg total) by mouth daily. 120 tablet 1  . VENTOLIN HFA 108 (90 BASE) MCG/ACT inhaler Inhale 2 puffs into the lungs every 6 (six) hours as needed for wheezing or shortness of breath.   0  . warfarin (COUMADIN) 2 MG tablet Take 1 tablet (2 mg total) by mouth daily at 6 PM. 30 tablet 1  . ondansetron (ZOFRAN ODT) 4 MG disintegrating tablet Take 1 tablet (4 mg total) by mouth every 8 (eight) hours as needed for nausea or vomiting. 20 tablet 0   No current facility-administered medications for this visit.     No Known Allergies  Review of Systems  Energy slowly  improving Patient has a wet cough and will start Mucinex  BP 133/88 (BP Location: Left Arm, Patient Position: Sitting, Cuff Size: Normal)   Pulse (!) 122   Resp 18   Ht 5\' 6"  (1.676 m)   Wt 175 lb (79.4 kg)   SpO2 94% Comment: RA  BMI 28.25 kg/m  Physical Exam      Exam    General- alert and comfortable    Neck- no JVD, no cervical adenopathy palpable, no carotid bruit   Lungs- clear without rales, wheezes Sternal wound very superficial with good granulation tissue measuring approximately 3 cm in length half centimeter in width  Cor- regular rate and rhythm, no murmur , gallop   Abdomen- soft, non-tender   Extremities - warm, non-tender,  minimal edema   Neuro- oriented, appropriate, no focal weakness   Diagnostic Tests: Chest take image personally reviewed showing clear lung fields no pleural effusion sternal wires intact  Impression: Good healing by secondary intent of the superficial sternal wound Continue current daily wet-to-dry dressing changes Patient will add zinc 1 tablet daily to augment wound healing  Patient is being treated for a left arm DVT with Coumadin-related to a PICC line placed in the hospital.  Coumadin will be continued until March.  She has had no bleeding complications and last INR 2.1 Plan: Return for wound check in 2 weeks   Len Childs, MD [facility] [phone]

## 2018-02-06 ENCOUNTER — Telehealth: Payer: Self-pay | Admitting: *Deleted

## 2018-02-06 ENCOUNTER — Ambulatory Visit (INDEPENDENT_AMBULATORY_CARE_PROVIDER_SITE_OTHER): Payer: BLUE CROSS/BLUE SHIELD | Admitting: *Deleted

## 2018-02-06 DIAGNOSIS — I749 Embolism and thrombosis of unspecified artery: Secondary | ICD-10-CM | POA: Diagnosis not present

## 2018-02-06 DIAGNOSIS — G459 Transient cerebral ischemic attack, unspecified: Secondary | ICD-10-CM | POA: Diagnosis not present

## 2018-02-06 DIAGNOSIS — Z5181 Encounter for therapeutic drug level monitoring: Secondary | ICD-10-CM | POA: Diagnosis not present

## 2018-02-06 LAB — POCT INR: INR: 2.4 (ref 2.0–3.0)

## 2018-02-06 NOTE — Telephone Encounter (Signed)
Emily Rocha w/ [facility] called w/   INR 2.4 PT 29.3  Please call [phone]

## 2018-02-06 NOTE — Telephone Encounter (Signed)
Done.  See coumadin note. 

## 2018-02-06 NOTE — Patient Instructions (Signed)
Continue coumadin 2mg  daily except 1mg  on Sundays, Tuesdays and Thursdays Order given to [place] on 02/20/17 in office.  D/C from [place] this week.

## 2018-02-15 DIAGNOSIS — C801 Malignant (primary) neoplasm, unspecified: Secondary | ICD-10-CM

## 2018-02-15 DIAGNOSIS — Z85118 Personal history of other malignant neoplasm of bronchus and lung: Secondary | ICD-10-CM

## 2018-02-15 HISTORY — DX: Malignant (primary) neoplasm, unspecified: C80.1

## 2018-02-15 HISTORY — DX: Personal history of other malignant neoplasm of bronchus and lung: Z85.118

## 2018-02-16 ENCOUNTER — Encounter: Payer: Self-pay | Admitting: Cardiothoracic Surgery

## 2018-02-16 ENCOUNTER — Other Ambulatory Visit: Payer: Self-pay

## 2018-02-16 ENCOUNTER — Ambulatory Visit (INDEPENDENT_AMBULATORY_CARE_PROVIDER_SITE_OTHER): Payer: Self-pay | Admitting: Cardiothoracic Surgery

## 2018-02-16 VITALS — BP 125/80 | HR 104 | Resp 20 | Ht 66.0 in | Wt 175.0 lb

## 2018-02-16 DIAGNOSIS — S21109A Unspecified open wound of unspecified front wall of thorax without penetration into thoracic cavity, initial encounter: Secondary | ICD-10-CM

## 2018-02-16 DIAGNOSIS — Z951 Presence of aortocoronary bypass graft: Secondary | ICD-10-CM

## 2018-02-16 NOTE — Progress Notes (Signed)
PCP is Glenda Chroman, MD Referring Provider is Glenda Chroman, MD  Chief Complaint  Patient presents with  . Routine Post Op    2 week f/u sternal wound check/ wet-dry dressing change daily     HPI: Patient presents for scheduled office visit for wound check.  She had redo sternotomy for reconstruction of the aortic arch and developed a superficial sternal wound breakdown.  This is now almost completely healed by secondary intent.  A narrow area of granulation tissue involving only the dermis is left.  This was treated with topical silver nitrate and she will continue saline wet-to-dry dressings.  This should be healed fairly soon.  Patient is postop hoarseness is also improving.  Patient has had no more TIAs since his surgery.  Patient has a 10 mm right upper lobe nodule seen on her preoperative [September 2019 ]CT scan of the neck.  We will get a follow-up CT of the chest to assess this nodular density in another 8 weeks after she is more fully recovered.  The nodule was not noted on her postoperative chest x-rays.   Past Medical History:  Diagnosis Date  . Asthma   . COPD (chronic obstructive pulmonary disease) ([place])   . Coronary artery disease    hx CABG  . GERD (gastroesophageal reflux disease)   . Pneumonia   . Shortness of breath dyspnea   . Stroke [facility])    "mini stroke" numbness and weakness to left side of the body    Past Surgical History:  Procedure Laterality Date  . AORTIC ARCH ANGIOGRAPHY N/A 11/04/2017   Procedure: AORTIC ARCH ANGIOGRAPHY;  Surgeon: Elam Dutch, MD;  Location: [place] CV LAB;  Service: Cardiovascular;  Laterality: N/A;  . APPENDECTOMY    . APPLICATION OF WOUND VAC N/A 01/06/2018   Procedure: APPLICATION OF WOUND VAC;  Surgeon: Ivin Poot, MD;  Location: [place];  Service: Thoracic;  Laterality: N/A;  . APPLICATION OF WOUND VAC N/A 01/13/2018   Procedure: WOUND VAC CHANGE;  Surgeon: Ivin Poot, MD;  Location: [place];  Service:  Thoracic;  Laterality: N/A;  . CAROTID-SUBCLAVIAN BYPASS GRAFT Right 12/26/2017   Procedure: AORTIC TO RIGHT COMMON CAROTID AND RIGHT SUBCLAVIAN  ARTERY  BYPASS;  Surgeon: Elam Dutch, MD;  Location: [place];  Service: Vascular;  Laterality: Right;  . CHOLECYSTECTOMY    . CLOSURE OF DIAPHRAGM  12/26/2017   Procedure: REPAIR OF DIAPHRAGM;  Surgeon: Ivin Poot, MD;  Location: [place];  Service: Thoracic;;  . COLONOSCOPY N/A 10/03/2014   Procedure: COLONOSCOPY;  Surgeon: Rogene Houston, MD;  [facility];  Service: Endoscopy;  Laterality: N/A;  730  . CORONARY ARTERY BYPASS GRAFT N/A 12/14/2016   Procedure: CORONARY ARTERY BYPASS GRAFTING (CABG) x three , using left internal mammary artery and right leg greater saphenous vein harvested endoscopically;  Surgeon: Ivin Poot, MD;  Location: [place];  Service: Open Heart Surgery;  Laterality: N/A;  . ENDARTERECTOMY Right 12/06/2014   Procedure: ENDARTERECTOMY CAROTid;  Surgeon: Elam Dutch, MD;  Location: [facility] OR;  Service: Vascular;  Laterality: Right;  . ESOPHAGOGASTRODUODENOSCOPY N/A 12/03/2016   Procedure: ESOPHAGOGASTRODUODENOSCOPY (EGD);  Surgeon: Rogene Houston, MD;  [facility];  Service: Endoscopy;  Laterality: N/A;  7:30  . LEFT HEART CATH AND CORONARY ANGIOGRAPHY N/A 12/14/2016   Procedure: LEFT HEART CATH AND CORONARY ANGIOGRAPHY;  Surgeon: Burnell Blanks, MD;  Location: [place] CV LAB;  Service:  Cardiovascular;  Laterality: N/A;  . PERIPHERAL VASCULAR CATHETERIZATION N/A 12/05/2014   Procedure:  Carotid Arch Angiography;  Surgeon: Conrad Clinch, MD;  Location: [place] CV LAB;  Service: Cardiovascular;  Laterality: N/A;  . STERNAL WOUND DEBRIDEMENT N/A 01/06/2018   Procedure: STERNAL WOUND DEBRIDEMENT;  Surgeon: Ivin Poot, MD;  Location: [place];  Service: Thoracic;  Laterality: N/A;  . STERNOTOMY N/A 12/26/2017   Procedure: REDO STERNOTOMY;  Surgeon: Prescott Gum, Collier Salina, MD;   Location: [place];  Service: Thoracic;  Laterality: N/A;  . TEE WITHOUT CARDIOVERSION N/A 12/14/2016   Procedure: TRANSESOPHAGEAL ECHOCARDIOGRAM (TEE);  Surgeon: Prescott Gum, Collier Salina, MD;  Location: [place];  Service: Open Heart Surgery;  Laterality: N/A;    Family History  Problem Relation Age of Onset  . Heart attack Mother   . Cancer Mother   . Diabetes Mother   . Heart disease Mother   . Heart attack Father   . Heart disease Father     Social History Social History   Tobacco Use  . Smoking status: Former Smoker    Packs/day: 2.00    Years: 40.00    Pack years: 80.00  . Smokeless tobacco: Never Used  Substance Use Topics  . Alcohol use: No    Alcohol/week: 0.0 standard drinks  . Drug use: No    Current Outpatient Medications  Medication Sig Dispense Refill  . ALPRAZolam (XANAX) 1 MG tablet Take 1 mg by mouth 3 (three) times daily as needed for anxiety.     Marland Kitchen aspirin 81 MG chewable tablet Chew 81 mg by mouth daily.    Marland Kitchen atorvastatin (LIPITOR) 40 MG tablet Take 1 tablet (40 mg total) daily by mouth. 30 tablet 1  . Cholecalciferol (VITAMIN D) 2000 units CAPS Take 2,000 Units by mouth daily.    . metoprolol succinate (TOPROL-XL) 50 MG 24 hr tablet Take 50 mg by mouth daily. Take with or immediately following a meal.    . Multiple Vitamin (MULTIVITAMIN WITH MINERALS) TABS tablet Take 1 tablet by mouth daily.    . ondansetron (ZOFRAN ODT) 4 MG disintegrating tablet Take 1 tablet (4 mg total) by mouth every 8 (eight) hours as needed for nausea or vomiting. 20 tablet 0  . pantoprazole (PROTONIX) 40 MG tablet Take 40 mg by mouth daily.    . potassium chloride SA (K-DUR,KLOR-CON) 20 MEQ tablet Take 20 mEq by mouth 2 (two) times daily.   2  . SYMBICORT 160-4.5 MCG/ACT inhaler Inhale 2 puffs into the lungs 2 (two) times daily.   4  . torsemide (DEMADEX) 20 MG tablet Take 2 tablets (40 mg total) by mouth daily. 120 tablet 1  . VENTOLIN HFA 108 (90 BASE) MCG/ACT inhaler Inhale 2 puffs into  the lungs every 6 (six) hours as needed for wheezing or shortness of breath.   0  . warfarin (COUMADIN) 2 MG tablet Take 1 tablet (2 mg total) by mouth daily at 6 PM. 30 tablet 1   No current facility-administered medications for this visit.     No Known Allergies  Review of Systems  Patient strength and overall wellness has significantly improved. Patient is still on Coumadin for postop DVT of her left arm probably related to a PICC line.  She will continue Coumadin for another month. BP 125/80 (BP Location: Right Arm, Patient Position: Sitting, Cuff Size: Large)   Pulse (!) 104   Resp 20   Ht 5\' 6"  (1.676 m)   Wt 175 lb (79.4  kg)   SpO2 95% Comment: RA  BMI 28.25 kg/m  Physical Exam Alert and comfortable Hoarseness has improved Lungs clear Sternum stable Heart rate regular No left arm swelling  Diagnostic Tests: No chest x-ray today  Impression: Doing well recovering well after redo sternotomy and arch reconstruction with deep branching the innominate and right subclavian arteries with Dacron graft.  Plan: Return for wound check in 1 month and to discuss terminating Coumadin at that time.  Patient will return on March 4 for a CT scan of the chest to assess the 10 mm right upper lobe nodule.   Len Childs, MD [facility] [phone]

## 2018-02-20 ENCOUNTER — Ambulatory Visit (INDEPENDENT_AMBULATORY_CARE_PROVIDER_SITE_OTHER): Payer: BLUE CROSS/BLUE SHIELD | Admitting: Pharmacist

## 2018-02-20 DIAGNOSIS — G459 Transient cerebral ischemic attack, unspecified: Secondary | ICD-10-CM | POA: Diagnosis not present

## 2018-02-20 DIAGNOSIS — I749 Embolism and thrombosis of unspecified artery: Secondary | ICD-10-CM

## 2018-02-20 DIAGNOSIS — Z5181 Encounter for therapeutic drug level monitoring: Secondary | ICD-10-CM | POA: Diagnosis not present

## 2018-02-20 LAB — POCT INR: INR: 1.2 — AB (ref 2.0–3.0)

## 2018-02-20 NOTE — Patient Instructions (Signed)
Description   Take 1.5 tablets today and 1 tablet tomorrow then continue coumadin 2mg  daily except 1mg  on Sundays, Tuesdays and Thursdays Recheck INR in 1 week

## 2018-02-27 ENCOUNTER — Institutional Professional Consult (permissible substitution): Payer: BLUE CROSS/BLUE SHIELD | Admitting: Pulmonary Disease

## 2018-02-27 ENCOUNTER — Ambulatory Visit (INDEPENDENT_AMBULATORY_CARE_PROVIDER_SITE_OTHER): Payer: BLUE CROSS/BLUE SHIELD | Admitting: Pharmacist

## 2018-02-27 DIAGNOSIS — Z5181 Encounter for therapeutic drug level monitoring: Secondary | ICD-10-CM | POA: Diagnosis not present

## 2018-02-27 DIAGNOSIS — G459 Transient cerebral ischemic attack, unspecified: Secondary | ICD-10-CM | POA: Diagnosis not present

## 2018-02-27 DIAGNOSIS — I749 Embolism and thrombosis of unspecified artery: Secondary | ICD-10-CM | POA: Diagnosis not present

## 2018-02-27 LAB — POCT INR: INR: 1.1 — AB (ref 2.0–3.0)

## 2018-02-27 NOTE — Patient Instructions (Signed)
Description   Take 1.5 tablets today and tomorrow then change dose to coumadin 2mg  (1 tablet) daily except 3mg  (1.5 tablets) on Sundays and Thursdays Recheck INR in 1 week

## 2018-02-28 ENCOUNTER — Telehealth: Payer: Self-pay

## 2018-02-28 NOTE — Telephone Encounter (Signed)
Emily Rocha contacted the office and stated that she has come down with a cold.  She stated that she has a productive cough.  She wanted to know if she should contact her PCP.  She stated that she did not want to because they would just prescribe prednisone and she did not want to take it.  She stated that Dr. Prescott Gum put her on a z-pack and that worked much better.  I advised her to contact her PCP for further instructions and to let them know that she was placed on a z-pack last time and that helped.  She acknowledged receipt and thanked me for the call.

## 2018-03-01 ENCOUNTER — Telehealth: Payer: Self-pay

## 2018-03-01 NOTE — Telephone Encounter (Signed)
[name] with [facility] contacted the office [phone] stating Emily Rocha is requesting her home oxygen picked up.  Patient is s/p CABG with Dr. Prescott Gum.  Per Dr. Prescott Gum, Oxygen can be picked up.  Fax sent to [facility] for home O2 pick-up.

## 2018-03-06 ENCOUNTER — Ambulatory Visit (INDEPENDENT_AMBULATORY_CARE_PROVIDER_SITE_OTHER): Payer: BLUE CROSS/BLUE SHIELD | Admitting: Pharmacist

## 2018-03-06 DIAGNOSIS — I749 Embolism and thrombosis of unspecified artery: Secondary | ICD-10-CM

## 2018-03-06 DIAGNOSIS — G459 Transient cerebral ischemic attack, unspecified: Secondary | ICD-10-CM

## 2018-03-06 DIAGNOSIS — Z5181 Encounter for therapeutic drug level monitoring: Secondary | ICD-10-CM | POA: Diagnosis not present

## 2018-03-06 LAB — POCT INR: INR: 2 (ref 2.0–3.0)

## 2018-03-06 NOTE — Patient Instructions (Signed)
Description   Change dose to coumadin 2mg  (1 tablet) daily except 3mg  (1.5 tablets) on Sundays Tuesdays and Thursdays Recheck INR in 1 week

## 2018-03-13 ENCOUNTER — Ambulatory Visit (INDEPENDENT_AMBULATORY_CARE_PROVIDER_SITE_OTHER): Payer: BLUE CROSS/BLUE SHIELD | Admitting: Pharmacist

## 2018-03-13 DIAGNOSIS — Z5181 Encounter for therapeutic drug level monitoring: Secondary | ICD-10-CM

## 2018-03-13 DIAGNOSIS — G459 Transient cerebral ischemic attack, unspecified: Secondary | ICD-10-CM

## 2018-03-13 DIAGNOSIS — I749 Embolism and thrombosis of unspecified artery: Secondary | ICD-10-CM

## 2018-03-13 LAB — POCT INR: INR: 1.9 — AB (ref 2.0–3.0)

## 2018-03-13 NOTE — Patient Instructions (Signed)
Description   Take 1 1/2 tablets today then start taking 1 1/2 tablets daily, except Monday and Friday.  Recheck INR in 1 week

## 2018-03-15 ENCOUNTER — Other Ambulatory Visit: Payer: Self-pay | Admitting: Physician Assistant

## 2018-03-16 ENCOUNTER — Other Ambulatory Visit: Payer: Self-pay | Admitting: *Deleted

## 2018-03-16 DIAGNOSIS — R911 Solitary pulmonary nodule: Secondary | ICD-10-CM

## 2018-03-22 ENCOUNTER — Telehealth: Payer: Self-pay

## 2018-03-22 NOTE — Telephone Encounter (Signed)
Patient contacted and made aware.

## 2018-03-22 NOTE — Telephone Encounter (Signed)
-----   Message from Ivin Poot, MD sent at 03/21/2018  5:41 PM EST ----- Stop coumadin thanks ----- Message ----- From: Donnella Sham, RN Sent: 03/21/2018  10:46 AM EST To: Ivin Poot, MD  Hey Dr. Prescott Gum,  Ms. Yera called and stated that she has not taken her Warfarin in 4 days because it was denied at the pharmacy.  She thought that you possibly did not want her to continue to take them.  I noticed on your last note that you were going to stop the Warfarin.  Do you want her to continue to take the medicine?  Or discontinue it early?  Thanks,  Caryl Pina

## 2018-03-29 ENCOUNTER — Ambulatory Visit: Payer: BLUE CROSS/BLUE SHIELD | Admitting: Cardiothoracic Surgery

## 2018-04-05 ENCOUNTER — Other Ambulatory Visit: Payer: Self-pay

## 2018-04-05 ENCOUNTER — Ambulatory Visit: Payer: BLUE CROSS/BLUE SHIELD | Admitting: Cardiothoracic Surgery

## 2018-04-05 ENCOUNTER — Encounter: Payer: Self-pay | Admitting: Cardiothoracic Surgery

## 2018-04-05 VITALS — BP 120/82 | HR 79 | Resp 18 | Ht 66.0 in | Wt 176.0 lb

## 2018-04-05 DIAGNOSIS — Z951 Presence of aortocoronary bypass graft: Secondary | ICD-10-CM

## 2018-04-05 DIAGNOSIS — S21109A Unspecified open wound of unspecified front wall of thorax without penetration into thoracic cavity, initial encounter: Secondary | ICD-10-CM | POA: Diagnosis not present

## 2018-04-05 NOTE — Progress Notes (Signed)
PCP is Glenda Chroman, MD Referring Provider is Glenda Chroman, MD  Chief Complaint  Patient presents with  . Routine Post Op    wound check, discuss medications, HX of CABG    HPI: Patient turns for scheduled follow-up  In October 2018 she underwent urgent CABG x3 for left main unstable angina In November 2019 she underwent redo sternotomy, aortic arch reconstruction for ostial right common carotid stenosis with Dr. Oneida Alar.  Patient had superficial sternal breakdown which was treated with wound VAC and dressing changes and has now healed.  The patient denies symptoms of TIA.  She denies angina.  She is not a smoker.  The patient has a 11 mm right upper lobe nodule which was noted in 2019 during a CTA of the neck.  This was prior to the the time of redo sternotomy and it was not accessible through that incision.  She will return with a 28-month follow-up full CT chest scan in 2 weeks.  If the nodule has not shown any decrease in size she will need a PET scan and further evaluation possible biopsy.  She has severe COPD and further thoracic surgery would be at increased risk.   Past Medical History:  Diagnosis Date  . Asthma   . COPD (chronic obstructive pulmonary disease) ([place])   . Coronary artery disease    hx CABG  . GERD (gastroesophageal reflux disease)   . Pneumonia   . Shortness of breath dyspnea   . Stroke [facility])    "mini stroke" numbness and weakness to left side of the body    Past Surgical History:  Procedure Laterality Date  . AORTIC ARCH ANGIOGRAPHY N/A 11/04/2017   Procedure: AORTIC ARCH ANGIOGRAPHY;  Surgeon: Elam Dutch, MD;  Location: [employer] [facility];  Service: Cardiovascular;  Laterality: N/A;  . APPENDECTOMY    . APPLICATION OF WOUND VAC N/A 01/06/2018   Procedure: APPLICATION OF WOUND VAC;  Surgeon: Ivin Poot, MD;  Location: [place];  Service: Thoracic;  Laterality: N/A;  . APPLICATION OF WOUND VAC N/A 01/13/2018   Procedure: WOUND VAC CHANGE;   Surgeon: Ivin Poot, MD;  Location: [place];  Service: Thoracic;  Laterality: N/A;  . CAROTID-SUBCLAVIAN BYPASS GRAFT Right 12/26/2017   Procedure: AORTIC TO RIGHT COMMON CAROTID AND RIGHT SUBCLAVIAN  ARTERY  BYPASS;  Surgeon: Elam Dutch, MD;  Location: [place];  Service: Vascular;  Laterality: Right;  . CHOLECYSTECTOMY    . CLOSURE OF DIAPHRAGM  12/26/2017   Procedure: REPAIR OF DIAPHRAGM;  Surgeon: Ivin Poot, MD;  Location: [place];  Service: Thoracic;;  . COLONOSCOPY N/A 10/03/2014   Procedure: COLONOSCOPY;  Surgeon: Rogene Houston, MD;  [facility];  Service: Endoscopy;  Laterality: N/A;  730  . CORONARY ARTERY BYPASS GRAFT N/A 12/14/2016   Procedure: CORONARY ARTERY BYPASS GRAFTING (CABG) x three , using left internal mammary artery and right leg greater saphenous vein harvested endoscopically;  Surgeon: Ivin Poot, MD;  Location: [place];  Service: Open Heart Surgery;  Laterality: N/A;  . ENDARTERECTOMY Right 12/06/2014   Procedure: ENDARTERECTOMY CAROTid;  Surgeon: Elam Dutch, MD;  Location: [facility] OR;  Service: Vascular;  Laterality: Right;  . ESOPHAGOGASTRODUODENOSCOPY N/A 12/03/2016   Procedure: ESOPHAGOGASTRODUODENOSCOPY (EGD);  Surgeon: Rogene Houston, MD;  [facility];  Service: Endoscopy;  Laterality: N/A;  7:30  . LEFT HEART CATH AND CORONARY ANGIOGRAPHY N/A 12/14/2016   Procedure: LEFT HEART CATH AND CORONARY  ANGIOGRAPHY;  Surgeon: Burnell Blanks, MD;  Location: [place] [facility];  Service: Cardiovascular;  Laterality: N/A;  . PERIPHERAL VASCULAR CATHETERIZATION N/A 12/05/2014   Procedure:  Carotid Arch Angiography;  Surgeon: Conrad [place], MD;  Location: [place] [facility];  Service: Cardiovascular;  Laterality: N/A;  . STERNAL WOUND DEBRIDEMENT N/A 01/06/2018   Procedure: STERNAL WOUND DEBRIDEMENT;  Surgeon: Ivin Poot, MD;  Location: [place];  Service: Thoracic;  Laterality: N/A;  . STERNOTOMY N/A 12/26/2017    Procedure: REDO STERNOTOMY;  Surgeon: Prescott Gum, Collier Salina, MD;  Location: [place];  Service: Thoracic;  Laterality: N/A;  . TEE WITHOUT CARDIOVERSION N/A 12/14/2016   Procedure: TRANSESOPHAGEAL ECHOCARDIOGRAM (TEE);  Surgeon: Prescott Gum, Collier Salina, MD;  Location: [place];  Service: Open Heart Surgery;  Laterality: N/A;    Family History  Problem Relation Age of Onset  . Heart attack Mother   . Cancer Mother   . Diabetes Mother   . Heart disease Mother   . Heart attack Father   . Heart disease Father     Social History Social History   Tobacco Use  . Smoking status: Former Smoker    Packs/day: 2.00    Years: 40.00    Pack years: 80.00  . Smokeless tobacco: Never Used  Substance Use Topics  . Alcohol use: No    Alcohol/week: 0.0 standard drinks  . Drug use: No    Current Outpatient Medications  Medication Sig Dispense Refill  . ALPRAZolam (XANAX) 1 MG tablet Take 1 mg by mouth 3 (three) times daily as needed for anxiety.     Marland Kitchen aspirin 81 MG chewable tablet Chew 81 mg by mouth daily.    Marland Kitchen atorvastatin (LIPITOR) 40 MG tablet Take 1 tablet (40 mg total) daily by mouth. 30 tablet 1  . Cholecalciferol (VITAMIN D) 2000 units CAPS Take 2,000 Units by mouth daily.    . metoprolol succinate (TOPROL-XL) 50 MG 24 hr tablet Take 50 mg by mouth daily. Take with or immediately following a meal.    . Multiple Vitamin (MULTIVITAMIN WITH MINERALS) TABS tablet Take 1 tablet by mouth daily.    . ondansetron (ZOFRAN ODT) 4 MG disintegrating tablet Take 1 tablet (4 mg total) by mouth every 8 (eight) hours as needed for nausea or vomiting. 20 tablet 0  . pantoprazole (PROTONIX) 40 MG tablet Take 40 mg by mouth daily.    . potassium chloride SA (K-DUR,KLOR-CON) 20 MEQ tablet Take 20 mEq by mouth 2 (two) times daily.   2  . SYMBICORT 160-4.5 MCG/ACT inhaler Inhale 2 puffs into the lungs 2 (two) times daily.   4  . torsemide (DEMADEX) 20 MG tablet Take 2 tablets (40 mg total) by mouth daily. 120 tablet 1  .  VENTOLIN HFA 108 (90 BASE) MCG/ACT inhaler Inhale 2 puffs into the lungs every 6 (six) hours as needed for wheezing or shortness of breath.   0   No current facility-administered medications for this visit.     No Known Allergies  Review of Systems   Her hoarseness has resolved. Complains of some shooting pain at the base of her left neck which sounds like arthritis and nerve compression type pain. She is off her home oxygen She is working full-time as a Engineer, technical sales  BP 120/82 (BP Location: Left Arm, Patient Position: Sitting, Cuff Size: Normal)   Pulse 79   Resp 18   Ht 5\' 6"  (1.676 m)   Wt 176 lb (79.8 kg)  SpO2 97% Comment: RA  BMI 28.41 kg/m  Physical Exam      Exam    General- alert and comfortable    Neck- no JVD, no cervical adenopathy palpable, no carotid bruit   Lungs- clear without rales, wheezes.  Sternal incision well-healed.   Cor- regular rate and rhythm, no murmur , gallop   Abdomen- soft, non-tender   Extremities - warm, non-tender, minimal edema   Neuro- oriented, appropriate, no focal weakness   Diagnostic Tests: None  Impression: Healed redo sternotomy incision.  Doing well after arch deep branching and reconstruction for innominate and proximal common carotid stenosis Patient now being evaluated for a right upper lobe nodule which has showed slow increase in size from 6 mm in 2018 to 11 mm late 2019.  Plan: Return in March for with CT scan of chest to assess right upper lobe pulmonary nodule.   Len Childs, MD [facility] [phone]

## 2018-04-19 ENCOUNTER — Other Ambulatory Visit: Payer: Self-pay | Admitting: *Deleted

## 2018-04-19 ENCOUNTER — Ambulatory Visit: Payer: BLUE CROSS/BLUE SHIELD | Admitting: Cardiothoracic Surgery

## 2018-04-19 ENCOUNTER — Encounter: Payer: Self-pay | Admitting: Cardiothoracic Surgery

## 2018-04-19 ENCOUNTER — Ambulatory Visit
Admission: RE | Admit: 2018-04-19 | Discharge: 2018-04-19 | Disposition: A | Discharge status: Home / Self Care | Payer: BLUE CROSS/BLUE SHIELD | Source: Ambulatory Visit | Attending: Cardiothoracic Surgery | Admitting: Cardiothoracic Surgery

## 2018-04-19 VITALS — BP 132/80 | HR 90 | Resp 20 | Ht 66.0 in | Wt 179.0 lb

## 2018-04-19 DIAGNOSIS — Z951 Presence of aortocoronary bypass graft: Secondary | ICD-10-CM | POA: Diagnosis not present

## 2018-04-19 DIAGNOSIS — R911 Solitary pulmonary nodule: Secondary | ICD-10-CM

## 2018-04-19 MED ORDER — IOPAMIDOL (ISOVUE-300) INJECTION 61%
75.0000 mL | Freq: Once | INTRAVENOUS | Status: AC | PRN
  Administered 2018-04-19: 75 mL via INTRAVENOUS

## 2018-04-19 NOTE — Progress Notes (Signed)
PCP is Glenda Chroman, MD Referring Provider is Glenda Chroman, MD  Chief Complaint  Patient presents with  . Lung Lesion    2 week f/u with Chest CT    HPI: Patient returns with follow-up CT scan of the chest to assess right upper lobe nodular density which in the past had a fairly high SUV on PET scan.  Today's CT scan shows mild increase in size in the 15 mm lobulated density.  No evidence of mediastinal adenopathy.  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  She is now in much better condition to undergo biopsy and treatment of the probable early stage right upper lobe lung cancer.  The patient will be referred to interventional radiology for CT-guided biopsy and then return for further discussion.   Past Medical History:  Diagnosis Date  . Asthma   . COPD (chronic obstructive pulmonary disease) ([place])   . Coronary artery disease    hx CABG  . GERD (gastroesophageal reflux disease)   . Pneumonia   . Shortness of breath dyspnea   . Stroke [facility])    "mini stroke" numbness and weakness to left side of the body    Past Surgical History:  Procedure Laterality Date  . AORTIC ARCH ANGIOGRAPHY N/A 11/04/2017   Procedure: AORTIC ARCH ANGIOGRAPHY;  Surgeon: Elam Dutch, MD;  Location: [place] [facility];  Service: Cardiovascular;  Laterality: N/A;  . APPENDECTOMY    . APPLICATION OF WOUND VAC N/A 01/06/2018   Procedure: APPLICATION OF WOUND VAC;  Surgeon: Ivin Poot, MD;  Location: [place];  Service: Thoracic;  Laterality: N/A;  . APPLICATION OF WOUND VAC N/A 01/13/2018   Procedure: WOUND VAC CHANGE;  Surgeon: Ivin Poot, MD;  Location: [place];  Service: Thoracic;  Laterality: N/A;  . CAROTID-SUBCLAVIAN BYPASS GRAFT Right 12/26/2017   Procedure: AORTIC TO RIGHT COMMON CAROTID AND RIGHT SUBCLAVIAN   ARTERY  BYPASS;  Surgeon: Elam Dutch, MD;  Location: [employer];  Service: Vascular;  Laterality: Right;  . CHOLECYSTECTOMY    . CLOSURE OF DIAPHRAGM  12/26/2017   Procedure: REPAIR OF DIAPHRAGM;  Surgeon: Ivin Poot, MD;  Location: [place];  Service: Thoracic;;  . COLONOSCOPY N/A 10/03/2014   Procedure: COLONOSCOPY;  Surgeon: Rogene Houston, MD;  [facility];  Service: Endoscopy;  Laterality: N/A;  730  . CORONARY ARTERY BYPASS GRAFT N/A 12/14/2016   Procedure: CORONARY ARTERY BYPASS GRAFTING (CABG) x three , using left internal mammary artery and right leg greater saphenous vein harvested endoscopically;  Surgeon: Ivin Poot, MD;  Location: [place];  Service: Open Heart Surgery;  Laterality: N/A;  . ENDARTERECTOMY Right 12/06/2014   Procedure: ENDARTERECTOMY CAROTid;  Surgeon: Elam Dutch, MD;  Location: [facility] OR;  Service: Vascular;  Laterality: Right;  . ESOPHAGOGASTRODUODENOSCOPY N/A 12/03/2016   Procedure: ESOPHAGOGASTRODUODENOSCOPY (EGD);  Surgeon: Rogene Houston, MD;  [facility];  Service: Endoscopy;  Laterality: N/A;  7:30  . LEFT HEART CATH AND CORONARY ANGIOGRAPHY N/A 12/14/2016   Procedure: LEFT HEART CATH AND CORONARY ANGIOGRAPHY;  Surgeon: Burnell Blanks, MD;  Location: [place] [facility];  Service: Cardiovascular;  Laterality: N/A;  . PERIPHERAL VASCULAR CATHETERIZATION N/A 12/05/2014   Procedure:  Carotid Arch Angiography;  Surgeon: Conrad Alderson, MD;  Location: [place] CV LAB;  Service: Cardiovascular;  Laterality: N/A;  . STERNAL WOUND DEBRIDEMENT N/A 01/06/2018   Procedure: STERNAL WOUND DEBRIDEMENT;  Surgeon: Ivin Poot, MD;  Location: [place];  Service: Thoracic;  Laterality: N/A;  . STERNOTOMY N/A 12/26/2017   Procedure: REDO STERNOTOMY;  Surgeon: Prescott Gum, Collier Salina, MD;  Location: [place];  Service: Thoracic;  Laterality: N/A;  . TEE WITHOUT CARDIOVERSION N/A 12/14/2016   Procedure: TRANSESOPHAGEAL ECHOCARDIOGRAM (TEE);   Surgeon: Prescott Gum, Collier Salina, MD;  Location: [place];  Service: Open Heart Surgery;  Laterality: N/A;    Family History  Problem Relation Age of Onset  . Heart attack Mother   . Cancer Mother   . Diabetes Mother   . Heart disease Mother   . Heart attack Father   . Heart disease Father     Social History Social History   Tobacco Use  . Smoking status: Former Smoker    Packs/day: 2.00    Years: 40.00    Pack years: 80.00  . Smokeless tobacco: Never Used  Substance Use Topics  . Alcohol use: No    Alcohol/week: 0.0 standard drinks  . Drug use: No    Current Outpatient Medications  Medication Sig Dispense Refill  . ALPRAZolam (XANAX) 1 MG tablet Take 1 mg by mouth 3 (three) times daily as needed for anxiety.     Marland Kitchen aspirin 81 MG chewable tablet Chew 81 mg by mouth daily.    Marland Kitchen atorvastatin (LIPITOR) 40 MG tablet Take 1 tablet (40 mg total) daily by mouth. 30 tablet 1  . Cholecalciferol (VITAMIN D) 2000 units CAPS Take 2,000 Units by mouth daily.    . metoprolol succinate (TOPROL-XL) 50 MG 24 hr tablet Take 50 mg by mouth daily. Take with or immediately following a meal.    . Multiple Vitamin (MULTIVITAMIN WITH MINERALS) TABS tablet Take 1 tablet by mouth daily.    . ondansetron (ZOFRAN ODT) 4 MG disintegrating tablet Take 1 tablet (4 mg total) by mouth every 8 (eight) hours as needed for nausea or vomiting. 20 tablet 0  . pantoprazole (PROTONIX) 40 MG tablet Take 40 mg by mouth daily.    . potassium chloride SA (K-DUR,KLOR-CON) 20 MEQ tablet Take 20 mEq by mouth 2 (two) times daily.   2  . SYMBICORT 160-4.5 MCG/ACT inhaler Inhale 2 puffs into the lungs 2 (two) times daily.   4  . torsemide (DEMADEX) 20 MG tablet Take 2 tablets (40 mg total) by mouth daily. 120 tablet 1  . VENTOLIN HFA 108 (90 BASE) MCG/ACT inhaler Inhale 2 puffs into the lungs every 6 (six) hours as needed for wheezing or shortness of breath.   0   No current facility-administered medications for this visit.     No  Known Allergies  Review of Systems   Patient's appetite and energy have returned back to normal Patient's voice hoarseness has resolved No angina No TIA symptoms No edema No shortness of breath  BP 132/80   Pulse 90   Resp 20   Ht 5\' 6"  (1.676 m)   Wt 179 lb (81.2 kg)   SpO2 95% Comment: RA  BMI 28.89 kg/m  Physical Exam      Exam    General- alert and comfortable    Neck- no JVD, no cervical adenopathy palpable, no carotid bruit   Lungs- clear without rales, wheezes   Cor- regular rate and rhythm, 2/6 systolic murmur present without gallop   Abdomen- soft, non-tender   Extremities - warm, non-tender,  minimal edema   Neuro- oriented, appropriate, no focal weakness  Diagnostic Tests: CT images pressure reviewed and discussed with patient and husband  Impression: Probable slowly growing right apical lung cancer.  Patient not a candidate for surgical resection because of her severe COPD heart disease and cerebrovascular disease.  We will plan biopsy then evaluation for possible SBRT if lung cancer is diagnosed  Plan: Return after IR biopsy of right upper lobe nodule  Len Childs, MD [facility] [phone]

## 2018-04-19 NOTE — Progress Notes (Unsigned)
Ct

## 2018-05-01 ENCOUNTER — Other Ambulatory Visit: Payer: Self-pay | Admitting: Radiology

## 2018-05-01 ENCOUNTER — Other Ambulatory Visit: Payer: Self-pay | Admitting: Physician Assistant

## 2018-05-02 ENCOUNTER — Ambulatory Visit (HOSPITAL_COMMUNITY)
Admission: RE | Admit: 2018-05-02 | Discharge: 2018-05-02 | Disposition: A | Discharge status: Home / Self Care | Payer: BLUE CROSS/BLUE SHIELD | Source: Ambulatory Visit | Attending: Cardiothoracic Surgery | Admitting: Cardiothoracic Surgery

## 2018-05-02 ENCOUNTER — Encounter (HOSPITAL_COMMUNITY): Payer: Self-pay

## 2018-05-02 ENCOUNTER — Other Ambulatory Visit: Payer: Self-pay

## 2018-05-02 DIAGNOSIS — R911 Solitary pulmonary nodule: Secondary | ICD-10-CM | POA: Insufficient documentation

## 2018-05-02 LAB — CBC
HCT: 38.3 % (ref 36.0–46.0)
Hemoglobin: 12 g/dL (ref 12.0–15.0)
MCH: 26.4 pg (ref 26.0–34.0)
MCHC: 31.3 g/dL (ref 30.0–36.0)
MCV: 84.2 fL (ref 80.0–100.0)
Platelets: 232 10*3/uL (ref 150–400)
RBC: 4.55 MIL/uL (ref 3.87–5.11)
RDW: 16 % — ABNORMAL HIGH (ref 11.5–15.5)
WBC: 5 10*3/uL (ref 4.0–10.5)
nRBC: 0 % (ref 0.0–0.2)

## 2018-05-02 LAB — PROTIME-INR
INR: 1 (ref 0.8–1.2)
Prothrombin Time: 12.7 seconds (ref 11.4–15.2)

## 2018-05-02 MED ORDER — SODIUM CHLORIDE 0.9 % IV SOLN: INTRAVENOUS | Status: DC

## 2018-05-02 MED ORDER — LIDOCAINE HCL 1 % IJ SOLN
INTRAMUSCULAR | Status: AC
  Filled 2018-05-02: qty 20

## 2018-05-02 NOTE — Progress Notes (Signed)
Procedure cancelled due to pt drinking coffee this morning. Pt D/C home with husband in no distress.

## 2018-05-02 NOTE — H&P (Signed)
Chief Complaint: Patient was seen in consultation today for right lung mass biopsy at the request of Ivin Poot   Supervising Physician: [name]  Patient Status: [facility] - Out-pt  History of Present Illness: Emily Rocha is a 63 y.o. female   COPD; former smoker Hx Cabg 2019 and 2019 No known Ca  Follows with Dr Darcey Nora Known RUL nodule since 2018  PET 10/2017:  IMPRESSION: 1. 11 mm right apical lung nodule is markedly hypermetabolic consistent with malignancy. No evidence for hypermetabolic metastatic disease in the neck, chest, abdomen, or pelvis.  CT 04/19/18: IMPRESSION: 1. Enlarging spiculated right upper lobe pulmonary nodule consistent with progressive bronchogenic carcinoma.  Scheduled now for biopsy of RUL nodule   Past Medical History:  Diagnosis Date   Asthma    COPD (chronic obstructive pulmonary disease) (HCC)    Coronary artery disease    hx CABG   GERD (gastroesophageal reflux disease)    Pneumonia    Shortness of breath dyspnea    Stroke [facility])    "mini stroke" numbness and weakness to left side of the body    Past Surgical History:  Procedure Laterality Date   AORTIC ARCH ANGIOGRAPHY N/A 11/04/2017   Procedure: AORTIC ARCH ANGIOGRAPHY;  Surgeon: Elam Dutch, MD;  Location: [place] [facility];  Service: Cardiovascular;  Laterality: N/A;   APPENDECTOMY     APPLICATION OF WOUND VAC N/A 01/06/2018   Procedure: APPLICATION OF WOUND VAC;  Surgeon: Ivin Poot, MD;  Location: [place];  Service: Thoracic;  Laterality: N/A;   APPLICATION OF WOUND VAC N/A 01/13/2018   Procedure: WOUND VAC CHANGE;  Surgeon: Ivin Poot, MD;  Location: [place];  Service: Thoracic;  Laterality: N/A;   CAROTID-SUBCLAVIAN BYPASS GRAFT Right 12/26/2017   Procedure: AORTIC TO RIGHT COMMON CAROTID AND RIGHT SUBCLAVIAN  ARTERY  BYPASS;  Surgeon: Elam Dutch, MD;  Location: [place];  Service: Vascular;  Laterality: Right;   CHOLECYSTECTOMY       CLOSURE OF DIAPHRAGM  12/26/2017   Procedure: REPAIR OF DIAPHRAGM;  Surgeon: Ivin Poot, MD;  Location: [place];  Service: Thoracic;;   COLONOSCOPY N/A 10/03/2014   Procedure: COLONOSCOPY;  Surgeon: Rogene Houston, MD;  [facility];  Service: Endoscopy;  Laterality: N/A;  730   CORONARY ARTERY BYPASS GRAFT N/A 12/14/2016   Procedure: CORONARY ARTERY BYPASS GRAFTING (CABG) x three , using left internal mammary artery and right leg greater saphenous vein harvested endoscopically;  Surgeon: Ivin Poot, MD;  Location: [place];  Service: Open Heart Surgery;  Laterality: N/A;   ENDARTERECTOMY Right 12/06/2014   Procedure: ENDARTERECTOMY CAROTid;  Surgeon: Elam Dutch, MD;  Location: [facility] OR;  Service: Vascular;  Laterality: Right;   ESOPHAGOGASTRODUODENOSCOPY N/A 12/03/2016   Procedure: ESOPHAGOGASTRODUODENOSCOPY (EGD);  Surgeon: Rogene Houston, MD;  [facility];  Service: Endoscopy;  Laterality: N/A;  7:30   LEFT HEART CATH AND CORONARY ANGIOGRAPHY N/A 12/14/2016   Procedure: LEFT HEART CATH AND CORONARY ANGIOGRAPHY;  Surgeon: Burnell Blanks, MD;  Location: [place] [facility];  Service: Cardiovascular;  Laterality: N/A;   PERIPHERAL VASCULAR CATHETERIZATION N/A 12/05/2014   Procedure:  Carotid Arch Angiography;  Surgeon: Conrad [place] Grande, MD;  Location: [place] [facility];  Service: Cardiovascular;  Laterality: N/A;   STERNAL WOUND DEBRIDEMENT N/A 01/06/2018   Procedure: STERNAL WOUND DEBRIDEMENT;  Surgeon: Ivin Poot, MD;  Location: [place];  Service: Thoracic;  Laterality: N/A;  STERNOTOMY N/A 12/26/2017   Procedure: REDO STERNOTOMY;  Surgeon: Prescott Gum, Collier Salina, MD;  Location: [place];  Service: Thoracic;  Laterality: N/A;   TEE WITHOUT CARDIOVERSION N/A 12/14/2016   Procedure: TRANSESOPHAGEAL ECHOCARDIOGRAM (TEE);  Surgeon: Prescott Gum, Collier Salina, MD;  Location: [place];  Service: Open Heart Surgery;  Laterality: N/A;    Allergies: Patient has no  known allergies.  Medications: Prior to Admission medications   Medication Sig Start Date End Date Taking? Authorizing Provider  ALPRAZolam Duanne Moron) 0.5 MG tablet Take 0.5 mg by mouth 3 (three) times daily as needed for anxiety.    Yes [provider]  aspirin 81 MG chewable tablet Chew 81 mg by mouth daily.   Yes [provider]  atorvastatin (LIPITOR) 40 MG tablet Take 1 tablet (40 mg total) daily by mouth. 12/27/16  Yes Gold, Wayne E, PA-C  metoprolol succinate (TOPROL-XL) 50 MG 24 hr tablet Take 50 mg by mouth daily. Take with or immediately following a meal.   Yes [provider]  pantoprazole (PROTONIX) 40 MG tablet Take 40 mg by mouth daily.   Yes [provider]  potassium chloride SA (K-DUR,KLOR-CON) 20 MEQ tablet Take 20 mEq by mouth 2 (two) times daily.  10/20/17  Yes [provider]  SYMBICORT 160-4.5 MCG/ACT inhaler Inhale 2 puffs into the lungs 2 (two) times daily.    Yes [provider]  torsemide (DEMADEX) 20 MG tablet Take 2 tablets (40 mg total) by mouth daily. 02/21/17  Yes Bensimhon, Shaune Pascal, MD  VENTOLIN HFA 108 (90 BASE) MCG/ACT inhaler Inhale 2 puffs into the lungs every 6 (six) hours as needed for wheezing or shortness of breath.  08/26/14  Yes [provider]  ondansetron (ZOFRAN ODT) 4 MG disintegrating tablet Take 1 tablet (4 mg total) by mouth every 8 (eight) hours as needed for nausea or vomiting. 02/01/18   Ivin Poot, MD     Family History  Problem Relation Age of Onset   Heart attack Mother    Cancer Mother    Diabetes Mother    Heart disease Mother    Heart attack Father    Heart disease Father     Social History   Socioeconomic History   Marital status: Married    Spouse name: Not on file   Number of children: Not on file   Years of education: Not on file   Highest education level: Not on file  Occupational History   Not on file  Social Needs   Financial resource strain:  Not on file   Food insecurity:    Worry: Not on file    Inability: Not on file   Transportation needs:    Medical: Not on file    Non-medical: Not on file  Tobacco Use   Smoking status: Former Smoker    Packs/day: 2.00    Years: 40.00    Pack years: 80.00   Smokeless tobacco: Never Used  Substance and Sexual Activity   Alcohol use: No    Alcohol/week: 0.0 standard drinks   Drug use: No   Sexual activity: Not on file  Lifestyle   Physical activity:    Days per week: Not on file    Minutes per session: Not on file   Stress: Not on file  Relationships   Social connections:    Talks on phone: Not on file    Gets together: Not on file    Attends religious service: Not on file  Active member of club or organization: Not on file    Attends meetings of clubs or organizations: Not on file    Relationship status: Not on file  Other Topics Concern   Not on file  Social History Narrative   Not on file    Review of Systems: A 12 point ROS discussed and pertinent positives are indicated in the HPI above.  All other systems are negative.  Review of Systems  Constitutional: Negative for activity change, fatigue and fever.  Respiratory: Positive for wheezing. Negative for cough.   Cardiovascular: Negative for chest pain.  Gastrointestinal: Negative for abdominal pain.  Neurological: Negative for weakness.  Psychiatric/Behavioral: Negative for behavioral problems and confusion.    Vital Signs: BP 121/62    Pulse 75    Temp 97.7 F (36.5 C) (Oral)    Resp 18    Ht 5\' 5"  (1.651 m)    Wt 176 lb (79.8 kg)    SpO2 98%    BMI 29.29 kg/m   Physical Exam Vitals signs reviewed.  Cardiovascular:     Rate and Rhythm: Regular rhythm.     Heart sounds: Normal heart sounds.  Pulmonary:     Effort: Pulmonary effort is normal.     Breath sounds: Normal breath sounds.  Abdominal:     Palpations: Abdomen is soft.  Musculoskeletal: Normal range of motion.  Skin:    General:  Skin is warm and dry.  Neurological:     Mental Status: She is alert and oriented to person, place, and time.  Psychiatric:        Mood and Affect: Mood normal.        Behavior: Behavior normal.        Thought Content: Thought content normal.        Judgment: Judgment normal.     Imaging: Ct Chest W Contrast  Result Date: 04/19/2018 CLINICAL DATA:  Followup pulmonary nodule. Former smoker with history of COPD. No history of malignancy. Creatinine was obtained on site at [place] at [address] Results: Creatinine 0.6 mg/dL. EXAM: CT CHEST WITH CONTRAST TECHNIQUE: Multidetector CT imaging of the chest was performed during intravenous contrast administration. CONTRAST:  28mL ISOVUE-300 IOPAMIDOL (ISOVUE-300) INJECTION 61% COMPARISON:  Radiographs 02/01/2018. PET-CT 10/31/2017. Neck CT 10/27/2017. Chest CT 12/12/2016. FINDINGS: Cardiovascular: Diffuse atherosclerosis of the aorta, great vessels and coronary arteries status post median sternotomy and CABG. The pulmonary arteries appear normal. The heart size is normal. There is no pericardial effusion. Mediastinum/Nodes: There are no enlarged mediastinal, hilar or axillary lymph nodes. Stable moderate size hiatal hernia. The trachea and thyroid gland appear unremarkable. Lungs/Pleura: There is no pleural effusion. Stable mild to moderate centrilobular emphysema. Spiculated nodule in the right upper lobe demonstrates progressive enlargement, measuring up to 1.7 x 1.5 cm on images 30 and 31 of series 8 (previously 11 mm maximally). This lesion was hypermetabolic on PET-CT and is consistent with lung cancer. No other pulmonary nodules are identified. Upper abdomen: Stable without acute findings. Previous cholecystectomy. Small ventral hernia containing only fat. Musculoskeletal/Chest wall: There is no chest wall mass or suspicious osseous finding. Nonunion of the median sternotomy without dehiscence or fracture of the sternotomy wires.  IMPRESSION: 1. Enlarging spiculated right upper lobe pulmonary nodule consistent with progressive bronchogenic carcinoma. 2. No evidence of metastatic disease. 3. Aortic Atherosclerosis (ICD10-I70.0) and Emphysema (ICD10-J43.9). 4. Nonunion of median sternotomy without sternotomy wire fracture or dehiscence. Electronically Signed   By: Caryl Comes.D.  On: 04/19/2018 17:19    Labs:  CBC: Recent Labs    01/08/18 0348 01/09/18 0616 01/13/18 0318 05/02/18 0630  WBC 9.9 9.6 7.5 5.0  HGB 7.4* 8.0* 7.0* 12.0  HCT 25.0* 27.0* 24.4* 38.3  PLT 347 410* 373 232    COAGS: Recent Labs    12/22/17 1535  02/27/18 1308 03/06/18 1306 03/13/18 1304 05/02/18 0630  INR 1.00   < > 1.1* 2.0 1.9* 1.0  APTT 31  --   --   --   --   --    < > = values in this interval not displayed.    BMP: Recent Labs    01/03/18 0241 01/07/18 0534 01/09/18 0616 01/11/18 0217  NA 133* 131* 134* 134*  K 4.0 4.7 3.6 3.6  CL 91* 94* 96* 92*  CO2 30 31 31  32  GLUCOSE 104* 127* 107* 93  BUN 11 8 6* 6*  CALCIUM 8.6* 8.6* 8.6* 8.7*  CREATININE 0.60 0.64 0.57 0.57  GFRNONAA >60 >60 >60 >60  GFRAA >60 >60 >60 >60    LIVER FUNCTION TESTS: Recent Labs    12/29/17 0606 12/30/17 0410 12/31/17 0620 01/01/18 0228  BILITOT 1.1 1.0 1.0 1.0  AST 33 24 21 20   ALT 36 30 29 26   ALKPHOS 91 86 89 90  PROT 6.0* 5.7* 6.2* 6.1*  ALBUMIN 2.8* 2.6* 2.7* 2.7*    TUMOR MARKERS: No results for input(s): AFPTM, CEA, CA199, CHROMGRNA in the last 8760 hours.  Assessment and Plan:  Known RUL nodule +PET Enlarging No known Ca Scheduled for biopsy of right upper lobe nodule Risks and benefits discussed with the patient including, but not limited to bleeding, hemoptysis, respiratory failure requiring intubation, infection, pneumothorax requiring chest tube placement, stroke from air embolism or even death.  All of the patient's questions were answered, patient is agreeable to proceed. Consent signed and in  chart.   Thank you for this interesting consult.  I greatly enjoyed meeting Emily Rocha and look forward to participating in their care.  A copy of this report was sent to the requesting provider on this date.  Electronically Signed: Lavonia Drafts, PA-C 05/02/2018, 7:17 AM   I spent a total of  30 Minutes   in face to face in clinical consultation, greater than 50% of which was counseling/coordinating care for RUL nodule biopsy

## 2018-05-09 ENCOUNTER — Ambulatory Visit: Payer: BLUE CROSS/BLUE SHIELD | Admitting: Cardiothoracic Surgery

## 2018-05-25 ENCOUNTER — Other Ambulatory Visit: Payer: Self-pay | Admitting: *Deleted

## 2018-05-25 DIAGNOSIS — R911 Solitary pulmonary nodule: Secondary | ICD-10-CM

## 2018-08-09 ENCOUNTER — Ambulatory Visit: Payer: BLUE CROSS/BLUE SHIELD | Admitting: Cardiothoracic Surgery

## 2018-08-09 ENCOUNTER — Ambulatory Visit
Admission: RE | Admit: 2018-08-09 | Discharge: 2018-08-09 | Disposition: A | Discharge status: Home / Self Care | Payer: Medicare HMO | Source: Ambulatory Visit | Attending: Cardiothoracic Surgery | Admitting: Cardiothoracic Surgery

## 2018-08-09 DIAGNOSIS — J439 Emphysema, unspecified: Secondary | ICD-10-CM | POA: Diagnosis not present

## 2018-08-09 DIAGNOSIS — C3490 Malignant neoplasm of unspecified part of unspecified bronchus or lung: Secondary | ICD-10-CM | POA: Diagnosis not present

## 2018-08-09 DIAGNOSIS — R911 Solitary pulmonary nodule: Secondary | ICD-10-CM

## 2018-08-09 MED ORDER — IOPAMIDOL (ISOVUE-300) INJECTION 61%
75.0000 mL | Freq: Once | INTRAVENOUS | Status: AC | PRN
  Administered 2018-08-09: 75 mL via INTRAVENOUS

## 2018-08-15 ENCOUNTER — Other Ambulatory Visit: Payer: Self-pay

## 2018-08-15 DIAGNOSIS — F17210 Nicotine dependence, cigarettes, uncomplicated: Secondary | ICD-10-CM | POA: Diagnosis not present

## 2018-08-15 DIAGNOSIS — Z6832 Body mass index (BMI) 32.0-32.9, adult: Secondary | ICD-10-CM | POA: Diagnosis not present

## 2018-08-15 DIAGNOSIS — J439 Emphysema, unspecified: Secondary | ICD-10-CM | POA: Diagnosis not present

## 2018-08-15 DIAGNOSIS — F419 Anxiety disorder, unspecified: Secondary | ICD-10-CM | POA: Diagnosis not present

## 2018-08-15 DIAGNOSIS — Z299 Encounter for prophylactic measures, unspecified: Secondary | ICD-10-CM | POA: Diagnosis not present

## 2018-08-15 DIAGNOSIS — I4891 Unspecified atrial fibrillation: Secondary | ICD-10-CM | POA: Diagnosis not present

## 2018-08-16 ENCOUNTER — Ambulatory Visit: Payer: Medicare HMO | Admitting: Cardiothoracic Surgery

## 2018-08-16 ENCOUNTER — Encounter: Payer: Self-pay | Admitting: Cardiothoracic Surgery

## 2018-08-16 VITALS — BP 155/72 | HR 92 | Temp 97.5°F | Resp 20 | Ht 65.0 in | Wt 184.0 lb

## 2018-08-16 DIAGNOSIS — Z951 Presence of aortocoronary bypass graft: Secondary | ICD-10-CM

## 2018-08-16 DIAGNOSIS — R911 Solitary pulmonary nodule: Secondary | ICD-10-CM | POA: Diagnosis not present

## 2018-08-16 NOTE — Progress Notes (Signed)
PCP is Glenda Chroman, MD Referring Provider is Glenda Chroman, MD  Chief Complaint  Patient presents with  . Lung Lesion    4 month f/u with Chest CT 08/09/18    HPI: Patient is a 63 year old reformed smoker with COPD who presents for further evaluation and discussion of a spiculated right upper lobe nodule.  She was scheduled for transthoracic needle aspirate 3 months ago but the procedure was canceled after she had some coffee in the morning and she left the hospital angry. She is recovering from major cardiac surgery including emergency CABG x3 in late 2018 for left main stenosis followed by redo sternotomy in November 2019 for arch reconstruction and TIAs.  The right upper lobe spiculated nodule was present but could not be accessed at the time of her most recent sternotomy.  She had some respiratory difficulties after the second sternotomy with preoperative PFTs showing FEV1 1.2 and DLCO 45%.  She is now back working full-time.  Room air saturation is 96% which dropped to 93% after rapid ambulation up and down the office hallway.  Repeat CT scan of the chest 3 months after the previous study shows some progression in size of the right upper lobe nodule, approximately 3 mm.  Previous PET scan showed a to have a SUV of 10.5.  There is no mediastinal nodal activity.  At this time the patient appears to have a much better pulmonary status and more likely to benefit from right VATS and resection of the right upper lobe nodule.  I have discussed potential surgery with her which would be at increased risk because of her COPD.  She may need a repeat PET scan because her last study was 9 months ago.  She will talk to her husband about a high risk right VATS and pulmonary resection.  She is currently working as a Engineer, [employer] up on tax returns and would not be ready to have surgery until August. Past Medical History:  Diagnosis Date  . Asthma   . COPD (chronic obstructive pulmonary disease) ([place])   .  Coronary artery disease    hx CABG  . GERD (gastroesophageal reflux disease)   . Pneumonia   . Shortness of breath dyspnea   . Stroke [facility])    "mini stroke" numbness and weakness to left side of the body    Past Surgical History:  Procedure Laterality Date  . AORTIC ARCH ANGIOGRAPHY N/A 11/04/2017   Procedure: AORTIC ARCH ANGIOGRAPHY;  Surgeon: Elam Dutch, MD;  Location: [place] [facility];  Service: Cardiovascular;  Laterality: N/A;  . APPENDECTOMY    . APPLICATION OF WOUND VAC N/A 01/06/2018   Procedure: APPLICATION OF WOUND VAC;  Surgeon: Ivin Poot, MD;  Location: [place];  Service: Thoracic;  Laterality: N/A;  . APPLICATION OF WOUND VAC N/A 01/13/2018   Procedure: WOUND VAC CHANGE;  Surgeon: Ivin Poot, MD;  Location: [place];  Service: Thoracic;  Laterality: N/A;  . CAROTID-SUBCLAVIAN BYPASS GRAFT Right 12/26/2017   Procedure: AORTIC TO RIGHT COMMON CAROTID AND RIGHT SUBCLAVIAN  ARTERY  BYPASS;  Surgeon: Elam Dutch, MD;  Location: [place];  Service: Vascular;  Laterality: Right;  . CHOLECYSTECTOMY    . CLOSURE OF DIAPHRAGM  12/26/2017   Procedure: REPAIR OF DIAPHRAGM;  Surgeon: Ivin Poot, MD;  Location: [place];  Service: Thoracic;;  . COLONOSCOPY N/A 10/03/2014   Procedure: COLONOSCOPY;  Surgeon: Rogene Houston, MD;  [facility];  Service:  Endoscopy;  Laterality: N/A;  730  . CORONARY ARTERY BYPASS GRAFT N/A 12/14/2016   Procedure: CORONARY ARTERY BYPASS GRAFTING (CABG) x three , using left internal mammary artery and right leg greater saphenous vein harvested endoscopically;  Surgeon: Ivin Poot, MD;  Location: [place];  Service: Open Heart Surgery;  Laterality: N/A;  . ENDARTERECTOMY Right 12/06/2014   Procedure: ENDARTERECTOMY CAROTid;  Surgeon: Elam Dutch, MD;  Location: [facility] OR;  Service: Vascular;  Laterality: Right;  . ESOPHAGOGASTRODUODENOSCOPY N/A 12/03/2016   Procedure: ESOPHAGOGASTRODUODENOSCOPY (EGD);  Surgeon: Rogene Houston, MD;  [facility];  Service: Endoscopy;  Laterality: N/A;  7:30  . LEFT HEART CATH AND CORONARY ANGIOGRAPHY N/A 12/14/2016   Procedure: LEFT HEART CATH AND CORONARY ANGIOGRAPHY;  Surgeon: Burnell Blanks, MD;  Location: [place] [facility];  Service: Cardiovascular;  Laterality: N/A;  . PERIPHERAL VASCULAR CATHETERIZATION N/A 12/05/2014   Procedure:  Carotid Arch Angiography;  Surgeon: Conrad [place], MD;  Location: [place] [facility];  Service: Cardiovascular;  Laterality: N/A;  . STERNAL WOUND DEBRIDEMENT N/A 01/06/2018   Procedure: STERNAL WOUND DEBRIDEMENT;  Surgeon: Ivin Poot, MD;  Location: [place];  Service: Thoracic;  Laterality: N/A;  . STERNOTOMY N/A 12/26/2017   Procedure: REDO STERNOTOMY;  Surgeon: Prescott Gum, Collier Salina, MD;  Location: [place];  Service: Thoracic;  Laterality: N/A;  . TEE WITHOUT CARDIOVERSION N/A 12/14/2016   Procedure: TRANSESOPHAGEAL ECHOCARDIOGRAM (TEE);  Surgeon: Prescott Gum, Collier Salina, MD;  Location: [place];  Service: Open Heart Surgery;  Laterality: N/A;    Family History  Problem Relation Age of Onset  . Heart attack Mother   . Cancer Mother   . Diabetes Mother   . Heart disease Mother   . Heart attack Father   . Heart disease Father     Social History Social History   Tobacco Use  . Smoking status: Former Smoker    Packs/day: 2.00    Years: 40.00    Pack years: 80.00  . Smokeless tobacco: Never Used  Substance Use Topics  . Alcohol use: No    Alcohol/week: 0.0 standard drinks  . Drug use: No    Current Outpatient Medications  Medication Sig Dispense Refill  . ALPRAZolam (XANAX) 0.5 MG tablet Take 0.5 mg by mouth 3 (three) times daily as needed for anxiety.     Marland Kitchen aspirin 81 MG chewable tablet Chew 81 mg by mouth daily.    Marland Kitchen atorvastatin (LIPITOR) 40 MG tablet Take 1 tablet (40 mg total) daily by mouth. 30 tablet 1  . metoprolol succinate (TOPROL-XL) 50 MG 24 hr tablet Take 50 mg by mouth daily. Take with or immediately  following a meal.    . ondansetron (ZOFRAN ODT) 4 MG disintegrating tablet Take 1 tablet (4 mg total) by mouth every 8 (eight) hours as needed for nausea or vomiting. 20 tablet 0  . pantoprazole (PROTONIX) 40 MG tablet Take 40 mg by mouth daily.    . potassium chloride SA (K-DUR,KLOR-CON) 20 MEQ tablet Take 20 mEq by mouth 2 (two) times daily.   2  . SYMBICORT 160-4.5 MCG/ACT inhaler Inhale 2 puffs into the lungs 2 (two) times daily.   4  . torsemide (DEMADEX) 20 MG tablet Take 2 tablets (40 mg total) by mouth daily. 120 tablet 1  . VENTOLIN HFA 108 (90 BASE) MCG/ACT inhaler Inhale 2 puffs into the lungs every 6 (six) hours as needed for wheezing or shortness of breath.   0  No current facility-administered medications for this visit.     No Known Allergies  Review of Systems  Weight stable Good exercise tolerance No cough fever or productive cough Minimal shortness of breath No ankle edema No dizziness TIA symptoms No difficulty swallowing  BP (!) 155/72   Pulse 92   Temp (!) 97.5 F (36.4 C) (Skin)   Resp 20   Ht 5\' 5"  (1.651 m)   Wt 184 lb (83.5 kg)   SpO2 95% Comment: RA  BMI 30.62 kg/m  Physical Exam      Exam    General- alert and comfortable    Neck- no JVD, no cervical adenopathy palpable, no carotid bruit   Lungs- clear without rales, wheezes   Cor- regular rate and rhythm, no murmur , gallop   Abdomen- soft, non-tender   Extremities - warm, non-tender, minimal edema   Neuro- oriented, appropriate, no focal weakness   Diagnostic Tests: Today's CT scan images reviewed and results counseled with patient Impression: Slowly growing right upper lobe spiculated nodule suspicious for primary bronchogenic carcinoma.  Appears to be clinical stage I.  Plan: Patient will discuss possible high risk surgery with husband.  We will be prepared to repeat PET scan and schedule surgery sometime in August.   Len Childs, MD [facility] [phone]

## 2018-08-17 ENCOUNTER — Other Ambulatory Visit: Payer: Self-pay | Admitting: Cardiothoracic Surgery

## 2018-08-17 ENCOUNTER — Other Ambulatory Visit: Payer: Self-pay | Admitting: *Deleted

## 2018-08-17 DIAGNOSIS — R911 Solitary pulmonary nodule: Secondary | ICD-10-CM

## 2018-08-24 ENCOUNTER — Ambulatory Visit (HOSPITAL_COMMUNITY): Payer: Medicare HMO

## 2018-08-28 ENCOUNTER — Other Ambulatory Visit (HOSPITAL_COMMUNITY): Payer: Medicare HMO

## 2018-08-30 ENCOUNTER — Encounter (HOSPITAL_COMMUNITY): Payer: Medicare HMO

## 2018-08-31 ENCOUNTER — Ambulatory Visit (HOSPITAL_COMMUNITY)
Admission: RE | Admit: 2018-08-31 | Discharge: 2018-08-31 | Disposition: A | Discharge status: Home / Self Care | Payer: Medicare HMO | Source: Ambulatory Visit | Attending: Cardiothoracic Surgery | Admitting: Cardiothoracic Surgery

## 2018-08-31 ENCOUNTER — Other Ambulatory Visit: Payer: Self-pay

## 2018-08-31 DIAGNOSIS — Z79899 Other long term (current) drug therapy: Secondary | ICD-10-CM | POA: Diagnosis not present

## 2018-08-31 DIAGNOSIS — R599 Enlarged lymph nodes, unspecified: Secondary | ICD-10-CM | POA: Diagnosis not present

## 2018-08-31 DIAGNOSIS — K5730 Diverticulosis of large intestine without perforation or abscess without bleeding: Secondary | ICD-10-CM | POA: Diagnosis not present

## 2018-08-31 DIAGNOSIS — K449 Diaphragmatic hernia without obstruction or gangrene: Secondary | ICD-10-CM | POA: Diagnosis not present

## 2018-08-31 DIAGNOSIS — I700 Atherosclerosis of aorta: Secondary | ICD-10-CM | POA: Diagnosis not present

## 2018-08-31 DIAGNOSIS — R911 Solitary pulmonary nodule: Secondary | ICD-10-CM

## 2018-08-31 DIAGNOSIS — J984 Other disorders of lung: Secondary | ICD-10-CM | POA: Diagnosis not present

## 2018-08-31 LAB — GLUCOSE, CAPILLARY: Glucose-Capillary: 93 mg/dL (ref 70–99)

## 2018-08-31 MED ORDER — FLUDEOXYGLUCOSE F - 18 (FDG) INJECTION
9.3400 | Freq: Once | INTRAVENOUS | Status: AC
  Administered 2018-08-31: 9.34 via INTRAVENOUS

## 2018-09-01 ENCOUNTER — Telehealth: Payer: Self-pay | Admitting: Cardiothoracic Surgery

## 2018-09-01 MED ORDER — LEVOFLOXACIN 750 MG PO TABS: 750.0000 mg | ORAL_TABLET | Freq: Every day | ORAL | 0 refills | Status: AC

## 2018-09-01 NOTE — Telephone Encounter (Signed)
I called the patient about her recent PET scan It appears the hypermetabolic RUL nodule is still stage I without mediastinal activity However she has new L lung infiltrates since recent CT chest c/w pneumonia. I have called in Rx for Levaquin to her pharmacy She now feels poorly with low grade fever Will cancel PFTs in one week until LUL pneumonia clears Will see back in office with CXR 2 weeks with CXR and schedule PFTs if appropriate

## 2018-09-05 ENCOUNTER — Other Ambulatory Visit: Payer: Medicare HMO

## 2018-09-05 ENCOUNTER — Other Ambulatory Visit: Payer: Self-pay

## 2018-09-05 ENCOUNTER — Other Ambulatory Visit (HOSPITAL_COMMUNITY): Payer: Medicare HMO

## 2018-09-05 DIAGNOSIS — Z20822 Contact with and (suspected) exposure to covid-19: Secondary | ICD-10-CM

## 2018-09-08 ENCOUNTER — Encounter (HOSPITAL_COMMUNITY): Payer: Medicare HMO

## 2018-09-08 DIAGNOSIS — Z1331 Encounter for screening for depression: Secondary | ICD-10-CM | POA: Diagnosis not present

## 2018-09-08 DIAGNOSIS — Z7189 Other specified counseling: Secondary | ICD-10-CM | POA: Diagnosis not present

## 2018-09-08 DIAGNOSIS — R5383 Other fatigue: Secondary | ICD-10-CM | POA: Diagnosis not present

## 2018-09-08 DIAGNOSIS — Z0000 Encounter for general adult medical examination without abnormal findings: Secondary | ICD-10-CM | POA: Diagnosis not present

## 2018-09-08 DIAGNOSIS — Z6832 Body mass index (BMI) 32.0-32.9, adult: Secondary | ICD-10-CM | POA: Diagnosis not present

## 2018-09-08 DIAGNOSIS — Z299 Encounter for prophylactic measures, unspecified: Secondary | ICD-10-CM | POA: Diagnosis not present

## 2018-09-08 DIAGNOSIS — Z1211 Encounter for screening for malignant neoplasm of colon: Secondary | ICD-10-CM | POA: Diagnosis not present

## 2018-09-08 DIAGNOSIS — Z713 Dietary counseling and surveillance: Secondary | ICD-10-CM | POA: Diagnosis not present

## 2018-09-08 DIAGNOSIS — Z1339 Encounter for screening examination for other mental health and behavioral disorders: Secondary | ICD-10-CM | POA: Diagnosis not present

## 2018-09-11 DIAGNOSIS — Z79899 Other long term (current) drug therapy: Secondary | ICD-10-CM | POA: Diagnosis not present

## 2018-09-11 DIAGNOSIS — I2510 Atherosclerotic heart disease of native coronary artery without angina pectoris: Secondary | ICD-10-CM | POA: Diagnosis not present

## 2018-09-11 DIAGNOSIS — F419 Anxiety disorder, unspecified: Secondary | ICD-10-CM | POA: Diagnosis not present

## 2018-09-11 DIAGNOSIS — R5383 Other fatigue: Secondary | ICD-10-CM | POA: Diagnosis not present

## 2018-09-13 ENCOUNTER — Other Ambulatory Visit: Payer: Self-pay | Admitting: Cardiothoracic Surgery

## 2018-09-13 ENCOUNTER — Ambulatory Visit
Admission: RE | Admit: 2018-09-13 | Discharge: 2018-09-13 | Disposition: A | Discharge status: Home / Self Care | Payer: Medicare HMO | Source: Ambulatory Visit | Attending: Cardiothoracic Surgery | Admitting: Cardiothoracic Surgery

## 2018-09-13 ENCOUNTER — Encounter: Payer: Self-pay | Admitting: Cardiothoracic Surgery

## 2018-09-13 ENCOUNTER — Ambulatory Visit: Payer: Medicare HMO | Admitting: Cardiothoracic Surgery

## 2018-09-13 ENCOUNTER — Other Ambulatory Visit: Payer: Self-pay

## 2018-09-13 DIAGNOSIS — Z713 Dietary counseling and surveillance: Secondary | ICD-10-CM | POA: Diagnosis not present

## 2018-09-13 DIAGNOSIS — J439 Emphysema, unspecified: Secondary | ICD-10-CM | POA: Diagnosis not present

## 2018-09-13 DIAGNOSIS — Z6832 Body mass index (BMI) 32.0-32.9, adult: Secondary | ICD-10-CM | POA: Diagnosis not present

## 2018-09-13 DIAGNOSIS — Z950 Presence of cardiac pacemaker: Secondary | ICD-10-CM | POA: Diagnosis not present

## 2018-09-13 DIAGNOSIS — M25511 Pain in right shoulder: Secondary | ICD-10-CM | POA: Diagnosis not present

## 2018-09-13 DIAGNOSIS — Z951 Presence of aortocoronary bypass graft: Secondary | ICD-10-CM

## 2018-09-13 DIAGNOSIS — R911 Solitary pulmonary nodule: Secondary | ICD-10-CM | POA: Diagnosis not present

## 2018-09-13 DIAGNOSIS — Z299 Encounter for prophylactic measures, unspecified: Secondary | ICD-10-CM | POA: Diagnosis not present

## 2018-09-13 MED ORDER — AZITHROMYCIN 500 MG PO TABS: 500.0000 mg | ORAL_TABLET | Freq: Every day | ORAL | 0 refills | Status: AC

## 2018-09-13 NOTE — Progress Notes (Signed)
PCP is Glenda Chroman, MD Referring Provider is Glenda Chroman, MD  Chief Complaint  Patient presents with  . Lung Lesion    f/u to further discuss surgery    HPI: The patient returns for further assessment for possible right VATS-lobectomy versus SBRT of a right upper lobe 2 cm nodule with SUV 10.5.  The patient has severe COPD.  PFTs in 2019 showed FEV1 1.2 and DLCO 44%.  She was scheduled to have repeat PFTs when she developed left upper lobe pneumonia and was treated with a course of oral antibiotics prednisone and inhalers.  Clinically she is significantly improved and on exam her lungs are much clear.  Chest x-ray today shows minimal residual left upper lobe infiltrate.  The patient was treated with a course of oral Levaquin.  She will be given a secondary course of azithromycin and then rescheduled for PFTs next week.   Past Medical History:  Diagnosis Date  . Asthma   . COPD (chronic obstructive pulmonary disease) ([place])   . Coronary artery disease    hx CABG  . GERD (gastroesophageal reflux disease)   . Pneumonia   . Shortness of breath dyspnea   . Stroke [facility])    "mini stroke" numbness and weakness to left side of the body    Past Surgical History:  Procedure Laterality Date  . AORTIC ARCH ANGIOGRAPHY N/A 11/04/2017   Procedure: AORTIC ARCH ANGIOGRAPHY;  Surgeon: Elam Dutch, MD;  Location: [place] [facility];  Service: Cardiovascular;  Laterality: N/A;  . APPENDECTOMY    . APPLICATION OF WOUND VAC N/A 01/06/2018   Procedure: APPLICATION OF WOUND VAC;  Surgeon: Ivin Poot, MD;  Location: [place];  Service: Thoracic;  Laterality: N/A;  . APPLICATION OF WOUND VAC N/A 01/13/2018   Procedure: WOUND VAC CHANGE;  Surgeon: Ivin Poot, MD;  Location: [place];  Service: Thoracic;  Laterality: N/A;  . CAROTID-SUBCLAVIAN BYPASS GRAFT Right 12/26/2017   Procedure: AORTIC TO RIGHT COMMON CAROTID AND RIGHT SUBCLAVIAN  ARTERY  BYPASS;  Surgeon: Elam Dutch, MD;   Location: [place];  Service: Vascular;  Laterality: Right;  . CHOLECYSTECTOMY    . CLOSURE OF DIAPHRAGM  12/26/2017   Procedure: REPAIR OF DIAPHRAGM;  Surgeon: Ivin Poot, MD;  Location: [place];  Service: Thoracic;;  . COLONOSCOPY N/A 10/03/2014   Procedure: COLONOSCOPY;  Surgeon: Rogene Houston, MD;  [facility];  Service: Endoscopy;  Laterality: N/A;  730  . CORONARY ARTERY BYPASS GRAFT N/A 12/14/2016   Procedure: CORONARY ARTERY BYPASS GRAFTING (CABG) x three , using left internal mammary artery and right leg greater saphenous vein harvested endoscopically;  Surgeon: Ivin Poot, MD;  Location: [place];  Service: Open Heart Surgery;  Laterality: N/A;  . ENDARTERECTOMY Right 12/06/2014   Procedure: ENDARTERECTOMY CAROTid;  Surgeon: Elam Dutch, MD;  Location: [facility] OR;  Service: Vascular;  Laterality: Right;  . ESOPHAGOGASTRODUODENOSCOPY N/A 12/03/2016   Procedure: ESOPHAGOGASTRODUODENOSCOPY (EGD);  Surgeon: Rogene Houston, MD;  [facility];  Service: Endoscopy;  Laterality: N/A;  7:30  . LEFT HEART CATH AND CORONARY ANGIOGRAPHY N/A 12/14/2016   Procedure: LEFT HEART CATH AND CORONARY ANGIOGRAPHY;  Surgeon: Burnell Blanks, MD;  Location: [place] [facility];  Service: Cardiovascular;  Laterality: N/A;  . PERIPHERAL VASCULAR CATHETERIZATION N/A 12/05/2014   Procedure:  Carotid Arch Angiography;  Surgeon: Conrad Evangeline, MD;  Location: [place] [facility];  Service: Cardiovascular;  Laterality:  N/A;  . STERNAL WOUND DEBRIDEMENT N/A 01/06/2018   Procedure: STERNAL WOUND DEBRIDEMENT;  Surgeon: Ivin Poot, MD;  Location: [place];  Service: Thoracic;  Laterality: N/A;  . STERNOTOMY N/A 12/26/2017   Procedure: REDO STERNOTOMY;  Surgeon: Prescott Gum, Collier Salina, MD;  Location: [place];  Service: Thoracic;  Laterality: N/A;  . TEE WITHOUT CARDIOVERSION N/A 12/14/2016   Procedure: TRANSESOPHAGEAL ECHOCARDIOGRAM (TEE);  Surgeon: Prescott Gum, Collier Salina, MD;  Location: [place];   Service: Open Heart Surgery;  Laterality: N/A;    Family History  Problem Relation Age of Onset  . Heart attack Mother   . Cancer Mother   . Diabetes Mother   . Heart disease Mother   . Heart attack Father   . Heart disease Father     Social History Social History   Tobacco Use  . Smoking status: Former Smoker    Packs/day: 2.00    Years: 40.00    Pack years: 80.00  . Smokeless tobacco: Never Used  Substance Use Topics  . Alcohol use: No    Alcohol/week: 0.0 standard drinks  . Drug use: No    Current Outpatient Medications  Medication Sig Dispense Refill  . ALPRAZolam (XANAX) 0.5 MG tablet Take 0.5 mg by mouth 3 (three) times daily as needed for anxiety.     Marland Kitchen aspirin 81 MG chewable tablet Chew 81 mg by mouth daily.    Marland Kitchen atorvastatin (LIPITOR) 40 MG tablet Take 1 tablet (40 mg total) daily by mouth. 30 tablet 1  . levofloxacin (LEVAQUIN) 750 MG tablet Take 1 tablet (750 mg total) by mouth daily for 14 days. 14 tablet 0  . metoprolol succinate (TOPROL-XL) 50 MG 24 hr tablet Take 50 mg by mouth daily. Take with or immediately following a meal.    . ondansetron (ZOFRAN ODT) 4 MG disintegrating tablet Take 1 tablet (4 mg total) by mouth every 8 (eight) hours as needed for nausea or vomiting. 20 tablet 0  . pantoprazole (PROTONIX) 40 MG tablet Take 40 mg by mouth daily.    . potassium chloride SA (K-DUR,KLOR-CON) 20 MEQ tablet Take 20 mEq by mouth 2 (two) times daily.   2  . SYMBICORT 160-4.5 MCG/ACT inhaler Inhale 2 puffs into the lungs 2 (two) times daily.   4  . torsemide (DEMADEX) 20 MG tablet Take 2 tablets (40 mg total) by mouth daily. 120 tablet 1  . VENTOLIN HFA 108 (90 BASE) MCG/ACT inhaler Inhale 2 puffs into the lungs every 6 (six) hours as needed for wheezing or shortness of breath.   0   No current facility-administered medications for this visit.     No Known Allergies  Review of Systems   Patient still recovering from left upper lobe pneumonia but strength  is improved and breathing has improved  BP (!) 145/77 (BP Location: Right Arm, Patient Position: Sitting, Cuff Size: Normal)   Pulse 86   Temp (!) 97.5 F (36.4 C)   Resp 18   Ht 5\' 5"  (1.651 m)   Wt 186 lb (84.4 kg)   SpO2 93% Comment: RA  BMI 30.95 kg/m  Physical Exam Alert and comfortable Well-healed sternal incisions Good breath sounds bilaterally with minimal rhonchi Heart rate regular Soft bruit through aortic arch from previous aortic arch reconstruction  Diagnostic Tests:  Chest x-ray image today personally reviewed showing some minimal residual left upper lobe markings.  Improved from previous studies. Impression: Patient will be scheduled for repeat PFTs and return for discussion of  possible right VATS lobectomy of probable left upper lobe lung cancer.  Plan: Complete course of azithromycin.  PFTs.  Return to office for further discussion.   Len Childs, MD [facility] [phone]

## 2018-09-14 DIAGNOSIS — M25511 Pain in right shoulder: Secondary | ICD-10-CM | POA: Diagnosis not present

## 2018-09-15 ENCOUNTER — Other Ambulatory Visit: Payer: Self-pay

## 2018-09-15 ENCOUNTER — Other Ambulatory Visit (HOSPITAL_COMMUNITY): Payer: Medicare HMO

## 2018-09-15 ENCOUNTER — Other Ambulatory Visit (HOSPITAL_COMMUNITY)
Admission: RE | Admit: 2018-09-15 | Discharge: 2018-09-15 | Disposition: A | Discharge status: Home / Self Care | Payer: Medicare HMO | Source: Ambulatory Visit | Attending: Cardiothoracic Surgery | Admitting: Cardiothoracic Surgery

## 2018-09-15 DIAGNOSIS — Z20828 Contact with and (suspected) exposure to other viral communicable diseases: Secondary | ICD-10-CM | POA: Diagnosis not present

## 2018-09-15 DIAGNOSIS — Z01812 Encounter for preprocedural laboratory examination: Secondary | ICD-10-CM | POA: Diagnosis not present

## 2018-09-15 DIAGNOSIS — R911 Solitary pulmonary nodule: Secondary | ICD-10-CM

## 2018-09-15 LAB — SARS CORONAVIRUS 2 (TAT 6-24 HRS): SARS Coronavirus 2: NEGATIVE

## 2018-09-18 DIAGNOSIS — Z713 Dietary counseling and surveillance: Secondary | ICD-10-CM | POA: Diagnosis not present

## 2018-09-18 DIAGNOSIS — Z299 Encounter for prophylactic measures, unspecified: Secondary | ICD-10-CM | POA: Diagnosis not present

## 2018-09-18 DIAGNOSIS — F419 Anxiety disorder, unspecified: Secondary | ICD-10-CM | POA: Diagnosis not present

## 2018-09-18 DIAGNOSIS — Z6832 Body mass index (BMI) 32.0-32.9, adult: Secondary | ICD-10-CM | POA: Diagnosis not present

## 2018-09-18 DIAGNOSIS — M25511 Pain in right shoulder: Secondary | ICD-10-CM | POA: Diagnosis not present

## 2018-09-21 ENCOUNTER — Other Ambulatory Visit: Payer: Self-pay

## 2018-09-21 ENCOUNTER — Ambulatory Visit (HOSPITAL_COMMUNITY)
Admission: RE | Admit: 2018-09-21 | Discharge: 2018-09-21 | Disposition: A | Discharge status: Home / Self Care | Payer: Medicare HMO | Source: Ambulatory Visit | Attending: Cardiothoracic Surgery | Admitting: Cardiothoracic Surgery

## 2018-09-21 DIAGNOSIS — R911 Solitary pulmonary nodule: Secondary | ICD-10-CM | POA: Insufficient documentation

## 2018-09-21 LAB — PULMONARY FUNCTION TEST
DL/VA % pred: 101 %
DL/VA: 4.19 ml/min/mmHg/L
DLCO unc % pred: 59 %
DLCO unc: 12.14 ml/min/mmHg
FEF 25-75 Post: 0.71 L/sec
FEF 25-75 Pre: 0.67 L/sec
FEF2575-%Change-Post: 6 %
FEF2575-%Pred-Post: 32 %
FEF2575-%Pred-Pre: 30 %
FEV1-%Change-Post: 0 %
FEV1-%Pred-Post: 42 %
FEV1-%Pred-Pre: 43 %
FEV1-Post: 1.08 L
FEV1-Pre: 1.08 L
FEV1FVC-%Change-Post: -3 %
FEV1FVC-%Pred-Pre: 85 %
FEV6-%Change-Post: 3 %
FEV6-%Pred-Post: 53 %
FEV6-%Pred-Pre: 51 %
FEV6-Post: 1.68 L
FEV6-Pre: 1.62 L
FEV6FVC-%Change-Post: 0 %
FEV6FVC-%Pred-Post: 102 %
FEV6FVC-%Pred-Pre: 103 %
FVC-%Change-Post: 3 %
FVC-%Pred-Post: 51 %
FVC-%Pred-Pre: 50 %
FVC-Post: 1.7 L
FVC-Pre: 1.64 L
Post FEV1/FVC ratio: 63 %
Post FEV6/FVC ratio: 99 %
Pre FEV1/FVC ratio: 66 %
Pre FEV6/FVC Ratio: 99 %
RV % pred: 104 %
RV: 2.24 L
TLC % pred: 78 %
TLC: 4.07 L

## 2018-09-21 MED ORDER — ALBUTEROL SULFATE (2.5 MG/3ML) 0.083% IN NEBU
2.5000 mg | INHALATION_SOLUTION | Freq: Once | RESPIRATORY_TRACT | Status: AC
  Administered 2018-09-21: 12:00:00 2.5 mg via RESPIRATORY_TRACT

## 2018-10-03 ENCOUNTER — Other Ambulatory Visit: Payer: Self-pay | Admitting: Cardiothoracic Surgery

## 2018-10-03 DIAGNOSIS — R911 Solitary pulmonary nodule: Secondary | ICD-10-CM

## 2018-10-04 ENCOUNTER — Ambulatory Visit
Admission: RE | Admit: 2018-10-04 | Discharge: 2018-10-04 | Disposition: A | Discharge status: Home / Self Care | Payer: Medicare HMO | Source: Ambulatory Visit | Attending: Cardiothoracic Surgery | Admitting: Cardiothoracic Surgery

## 2018-10-04 ENCOUNTER — Encounter: Payer: Self-pay | Admitting: Cardiothoracic Surgery

## 2018-10-04 ENCOUNTER — Other Ambulatory Visit: Payer: Self-pay

## 2018-10-04 ENCOUNTER — Ambulatory Visit: Payer: Medicare HMO | Admitting: Cardiothoracic Surgery

## 2018-10-04 VITALS — BP 104/58 | HR 77 | Temp 97.5°F | Resp 20 | Ht 65.0 in | Wt 184.0 lb

## 2018-10-04 DIAGNOSIS — Z951 Presence of aortocoronary bypass graft: Secondary | ICD-10-CM

## 2018-10-04 DIAGNOSIS — J984 Other disorders of lung: Secondary | ICD-10-CM | POA: Diagnosis not present

## 2018-10-04 DIAGNOSIS — R911 Solitary pulmonary nodule: Secondary | ICD-10-CM

## 2018-10-04 NOTE — Progress Notes (Signed)
PCP is Glenda Chroman, MD Referring Provider is Glenda Chroman, MD  Chief Complaint  Patient presents with  . Lung Lesion    review PFT's 09/21/18    HPI: Patient returns for further discussion regarding a hypermetabolic 2 cm right upper lobe nodule suspicious for primary bronchogenic carcinoma clinical stage I.  She has completely stop smoking since her last cardiac procedure.  She recently had PFTs after a recent left lung pneumonia cleared.  These show improvement since her PFTs 2019 prior to her second sternotomy.  FVC is 1.7.  FEV1 is 1.1.  DLCO is 60%.  She has excellent functional capacity with normal O2 saturation on room air able to walk up and down the hallway over 100 feet at a very fast pace.  Cardiac function is normal after emergency CABG followed 6 months later by de- branching of her arch for severe ASCVD  I had a frank discussion with the patient and her husband about the risks of pulmonary section.  She would probably need lobectomy as the best cancer operation.  Her pulmonary status tolerated 2 sternotomies although she did have some pulmonary issues.  Her PFTs appear to be borderline but probably adequate for minimally invasive right VATS and lobectomy.  Her recent left lung pneumonia has mainly cleared and her chest x-ray today shows only minimal airspace disease.  Patient will be evaluated by Dr. Kipp Brood for possible right VATS lobectomy.  She may need further functional scans of her pulmonary reserve.  She will continue to walk the golf course refrain from smoking and come back for evaluation by Dr. Kipp Brood.   Past Medical History:  Diagnosis Date  . Asthma   . COPD (chronic obstructive pulmonary disease) ([place])   . Coronary artery disease    hx CABG  . GERD (gastroesophageal reflux disease)   . Pneumonia   . Shortness of breath dyspnea   . Stroke [facility])    "mini stroke" numbness and weakness to left side of the body    Past Surgical History:  Procedure Laterality  Date  . AORTIC ARCH ANGIOGRAPHY N/A 11/04/2017   Procedure: AORTIC ARCH ANGIOGRAPHY;  Surgeon: Elam Dutch, MD;  Location: [place] [facility];  Service: Cardiovascular;  Laterality: N/A;  . APPENDECTOMY    . APPLICATION OF WOUND VAC N/A 01/06/2018   Procedure: APPLICATION OF WOUND VAC;  Surgeon: Ivin Poot, MD;  Location: [place];  Service: Thoracic;  Laterality: N/A;  . APPLICATION OF WOUND VAC N/A 01/13/2018   Procedure: WOUND VAC CHANGE;  Surgeon: Ivin Poot, MD;  Location: [place];  Service: Thoracic;  Laterality: N/A;  . CAROTID-SUBCLAVIAN BYPASS GRAFT Right 12/26/2017   Procedure: AORTIC TO RIGHT COMMON CAROTID AND RIGHT SUBCLAVIAN  ARTERY  BYPASS;  Surgeon: Elam Dutch, MD;  Location: [place];  Service: Vascular;  Laterality: Right;  . CHOLECYSTECTOMY    . CLOSURE OF DIAPHRAGM  12/26/2017   Procedure: REPAIR OF DIAPHRAGM;  Surgeon: Ivin Poot, MD;  Location: [place];  Service: Thoracic;;  . COLONOSCOPY N/A 10/03/2014   Procedure: COLONOSCOPY;  Surgeon: Rogene Houston, MD;  [facility];  Service: Endoscopy;  Laterality: N/A;  730  . CORONARY ARTERY BYPASS GRAFT N/A 12/14/2016   Procedure: CORONARY ARTERY BYPASS GRAFTING (CABG) x three , using left internal mammary artery and right leg greater saphenous vein harvested endoscopically;  Surgeon: Ivin Poot, MD;  Location: [place];  Service: Open Heart Surgery;  Laterality: N/A;  .  ENDARTERECTOMY Right 12/06/2014   Procedure: ENDARTERECTOMY CAROTid;  Surgeon: Elam Dutch, MD;  Location: [facility] OR;  Service: Vascular;  Laterality: Right;  . ESOPHAGOGASTRODUODENOSCOPY N/A 12/03/2016   Procedure: ESOPHAGOGASTRODUODENOSCOPY (EGD);  Surgeon: Rogene Houston, MD;  [facility];  Service: Endoscopy;  Laterality: N/A;  7:30  . LEFT HEART CATH AND CORONARY ANGIOGRAPHY N/A 12/14/2016   Procedure: LEFT HEART CATH AND CORONARY ANGIOGRAPHY;  Surgeon: Burnell Blanks, MD;  Location: [place] [facility];  Service: Cardiovascular;  Laterality: N/A;  . PERIPHERAL VASCULAR CATHETERIZATION N/A 12/05/2014   Procedure:  Carotid Arch Angiography;  Surgeon: Conrad Courtland, MD;  Location: [place] [facility];  Service: Cardiovascular;  Laterality: N/A;  . STERNAL WOUND DEBRIDEMENT N/A 01/06/2018   Procedure: STERNAL WOUND DEBRIDEMENT;  Surgeon: Ivin Poot, MD;  Location: [place];  Service: Thoracic;  Laterality: N/A;  . STERNOTOMY N/A 12/26/2017   Procedure: REDO STERNOTOMY;  Surgeon: Prescott Gum, Collier Salina, MD;  Location: [place];  Service: Thoracic;  Laterality: N/A;  . TEE WITHOUT CARDIOVERSION N/A 12/14/2016   Procedure: TRANSESOPHAGEAL ECHOCARDIOGRAM (TEE);  Surgeon: Prescott Gum, Collier Salina, MD;  Location: [place];  Service: Open Heart Surgery;  Laterality: N/A;    Family History  Problem Relation Age of Onset  . Heart attack Mother   . Cancer Mother   . Diabetes Mother   . Heart disease Mother   . Heart attack Father   . Heart disease Father     Social History Social History   Tobacco Use  . Smoking status: Former Smoker    Packs/day: 2.00    Years: 40.00    Pack years: 80.00  . Smokeless tobacco: Never Used  Substance Use Topics  . Alcohol use: No    Alcohol/week: 0.0 standard drinks  . Drug use: No    Current Outpatient Medications  Medication Sig Dispense Refill  . ALPRAZolam (XANAX) 0.5 MG tablet Take 0.5 mg by mouth 3 (three) times daily as needed for anxiety.     Marland Kitchen aspirin 81 MG chewable tablet Chew 81 mg by mouth daily.    Marland Kitchen atorvastatin (LIPITOR) 40 MG tablet Take 1 tablet (40 mg total) daily by mouth. 30 tablet 1  . metoprolol succinate (TOPROL-XL) 50 MG 24 hr tablet Take 50 mg by mouth daily. Take with or immediately following a meal.    . ondansetron (ZOFRAN ODT) 4 MG disintegrating tablet Take 1 tablet (4 mg total) by mouth every 8 (eight) hours as needed for nausea or vomiting. 20 tablet 0  . pantoprazole (PROTONIX) 40 MG tablet Take 40 mg by mouth daily.    . potassium  chloride SA (K-DUR,KLOR-CON) 20 MEQ tablet Take 20 mEq by mouth 2 (two) times daily.   2  . SYMBICORT 160-4.5 MCG/ACT inhaler Inhale 2 puffs into the lungs 2 (two) times daily.   4  . torsemide (DEMADEX) 20 MG tablet Take 2 tablets (40 mg total) by mouth daily. 120 tablet 1  . VENTOLIN HFA 108 (90 BASE) MCG/ACT inhaler Inhale 2 puffs into the lungs every 6 (six) hours as needed for wheezing or shortness of breath.   0   No current facility-administered medications for this visit.     No Known Allergies  Review of Systems   Gaining weight after recovery from left pneumonia Increase strength following recovery from left pneumonia No chest pain No TIAs No ankle edema No abdominal pain  BP (!) 104/58   Pulse 77   Temp (!)  97.5 F (36.4 C) (Skin)   Resp 20   Ht 5\' 5"  (1.651 m)   Wt 184 lb (83.5 kg)   SpO2 95% Comment: RA  BMI 30.62 kg/m  Physical Exam      Exam    General- alert and comfortable    Neck- no JVD, no cervical adenopathy palpable, no carotid bruit   Lungs- clear without rales, wheezes   Cor- regular rate and rhythm, no murmur , gallop   Abdomen- soft, non-tender   Extremities - warm, non-tender, minimal edema   Neuro- oriented, appropriate, no focal weakness   Diagnostic Tests: Chest x-ray images personally reviewed showing significant provement in the left lung pneumonia.  Impression: Probable right upper lobe bronchogenic carcinoma, clinical stage I patient has previously reviewed attempted transthoracic needle aspirate.  She wishes to have the nodule removed surgically.  Plan: Return for evaluation by Dr. Kipp Brood for possible minimally invasive VATS procedure which would help optimize her postop pulmonary function.   Len Childs, MD [facility] [phone]

## 2018-10-13 ENCOUNTER — Other Ambulatory Visit: Payer: Self-pay

## 2018-10-13 ENCOUNTER — Other Ambulatory Visit: Payer: Self-pay | Admitting: *Deleted

## 2018-10-13 ENCOUNTER — Encounter: Payer: Self-pay | Admitting: *Deleted

## 2018-10-13 ENCOUNTER — Other Ambulatory Visit: Payer: Self-pay | Admitting: Thoracic Surgery (Cardiothoracic Vascular Surgery)

## 2018-10-13 ENCOUNTER — Encounter: Payer: Self-pay | Admitting: Thoracic Surgery (Cardiothoracic Vascular Surgery)

## 2018-10-13 ENCOUNTER — Institutional Professional Consult (permissible substitution): Payer: Medicare HMO | Admitting: Thoracic Surgery (Cardiothoracic Vascular Surgery)

## 2018-10-13 ENCOUNTER — Telehealth (HOSPITAL_COMMUNITY): Payer: Self-pay

## 2018-10-13 VITALS — BP 124/72 | HR 79 | Temp 97.7°F | Resp 20 | Ht 65.0 in | Wt 185.0 lb

## 2018-10-13 DIAGNOSIS — R911 Solitary pulmonary nodule: Secondary | ICD-10-CM

## 2018-10-13 DIAGNOSIS — Z951 Presence of aortocoronary bypass graft: Secondary | ICD-10-CM

## 2018-10-13 NOTE — Telephone Encounter (Signed)
Encounter complete. 

## 2018-10-13 NOTE — Progress Notes (Signed)
[address] Heights[address]       [place], [address]             [phone]                    Charrisse H Malcolm [place] Medical Record #[id number] Date of Birth: 27-Aug-1955  Referring: Ivin Poot, MD Primary Care: Glenda Chroman, MD Primary Cardiologist: No primary care provider on file.  Chief Complaint:    Chief Complaint  Patient presents with  . Lung Lesion    Surgical eval,     History of Present Illness:    Emily Rocha 63 y.o. female with a history of an enlarging right upper lobe pulmonary nodule that was PET avid.  Of note, she has had some left lung parenchymal avidity, but she also had a pneumonia at the time of the PET/CT.  She currently is feeling much better.  She continues to have an intermittently productive cough, but denies any dyspnea.      Smoking Hx: Quit smoking in 2018 after her heart surgery.   Zubrod Score: At the time of surgery this patient's most appropriate activity status/level should be described as: [x]     0    Normal activity, no symptoms []     1    Restricted in physical strenuous activity but ambulatory, able to do out light work []     2    Ambulatory and capable of self care, unable to do work activities, up and about               >50 % of waking hours                              []     3    Only limited self care, in bed greater than 50% of waking hours []     4    Completely disabled, no self care, confined to bed or chair []     5    Moribund   Past Medical History:  Diagnosis Date  . Asthma   . COPD (chronic obstructive pulmonary disease) ([place])   . Coronary artery disease    hx CABG  . GERD (gastroesophageal reflux disease)   . Pneumonia   . Shortness of breath dyspnea   . Stroke [facility])    "mini stroke" numbness and weakness to left side of the body    Past Surgical History:  Procedure Laterality Date  . AORTIC ARCH ANGIOGRAPHY N/A 11/04/2017   Procedure: AORTIC ARCH ANGIOGRAPHY;  Surgeon: Elam Dutch, MD;   Location: [place] [facility];  Service: Cardiovascular;  Laterality: N/A;  . APPENDECTOMY    . APPLICATION OF WOUND VAC N/A 01/06/2018   Procedure: APPLICATION OF WOUND VAC;  Surgeon: Ivin Poot, MD;  Location: [place];  Service: Thoracic;  Laterality: N/A;  . APPLICATION OF WOUND VAC N/A 01/13/2018   Procedure: WOUND VAC CHANGE;  Surgeon: Ivin Poot, MD;  Location: [place];  Service: Thoracic;  Laterality: N/A;  . CAROTID-SUBCLAVIAN BYPASS GRAFT Right 12/26/2017   Procedure: AORTIC TO RIGHT COMMON CAROTID AND RIGHT SUBCLAVIAN  ARTERY  BYPASS;  Surgeon: Elam Dutch, MD;  Location: [place];  Service: Vascular;  Laterality: Right;  . CHOLECYSTECTOMY    . CLOSURE OF DIAPHRAGM  12/26/2017   Procedure: REPAIR OF DIAPHRAGM;  Surgeon: Ivin Poot, MD;  Location:  [place] OR;  Service: Thoracic;;  . COLONOSCOPY N/A 10/03/2014   Procedure: COLONOSCOPY;  Surgeon: Rogene Houston, MD;  [facility];  Service: Endoscopy;  Laterality: N/A;  730  . CORONARY ARTERY BYPASS GRAFT N/A 12/14/2016   Procedure: CORONARY ARTERY BYPASS GRAFTING (CABG) x three , using left internal mammary artery and right leg greater saphenous vein harvested endoscopically;  Surgeon: Ivin Poot, MD;  Location: [place];  Service: Open Heart Surgery;  Laterality: N/A;  . ENDARTERECTOMY Right 12/06/2014   Procedure: ENDARTERECTOMY CAROTid;  Surgeon: Elam Dutch, MD;  Location: [facility] OR;  Service: Vascular;  Laterality: Right;  . ESOPHAGOGASTRODUODENOSCOPY N/A 12/03/2016   Procedure: ESOPHAGOGASTRODUODENOSCOPY (EGD);  Surgeon: Rogene Houston, MD;  [facility];  Service: Endoscopy;  Laterality: N/A;  7:30  . LEFT HEART CATH AND CORONARY ANGIOGRAPHY N/A 12/14/2016   Procedure: LEFT HEART CATH AND CORONARY ANGIOGRAPHY;  Surgeon: Burnell Blanks, MD;  Location: [place] [facility];  Service: Cardiovascular;  Laterality: N/A;  . PERIPHERAL VASCULAR CATHETERIZATION N/A 12/05/2014   Procedure:   Carotid Arch Angiography;  Surgeon: Conrad Monterey, MD;  Location: [place] [facility];  Service: Cardiovascular;  Laterality: N/A;  . STERNAL WOUND DEBRIDEMENT N/A 01/06/2018   Procedure: STERNAL WOUND DEBRIDEMENT;  Surgeon: Ivin Poot, MD;  Location: [place];  Service: Thoracic;  Laterality: N/A;  . STERNOTOMY N/A 12/26/2017   Procedure: REDO STERNOTOMY;  Surgeon: Prescott Gum, Collier Salina, MD;  Location: [place];  Service: Thoracic;  Laterality: N/A;  . TEE WITHOUT CARDIOVERSION N/A 12/14/2016   Procedure: TRANSESOPHAGEAL ECHOCARDIOGRAM (TEE);  Surgeon: Prescott Gum, Collier Salina, MD;  Location: [place];  Service: Open Heart Surgery;  Laterality: N/A;    Family History  Problem Relation Age of Onset  . Heart attack Mother   . Cancer Mother   . Diabetes Mother   . Heart disease Mother   . Heart attack Father   . Heart disease Father      Social History   Tobacco Use  Smoking Status Former Smoker  . Packs/day: 2.00  . Years: 40.00  . Pack years: 80.00  Smokeless Tobacco Never Used    Social History   Substance and Sexual Activity  Alcohol Use No  . Alcohol/week: 0.0 standard drinks     No Known Allergies  Current Outpatient Medications  Medication Sig Dispense Refill  . ALPRAZolam (XANAX) 0.5 MG tablet Take 0.5 mg by mouth 3 (three) times daily as needed for anxiety.     Marland Kitchen aspirin 81 MG chewable tablet Chew 81 mg by mouth daily.    Marland Kitchen atorvastatin (LIPITOR) 40 MG tablet Take 1 tablet (40 mg total) daily by mouth. 30 tablet 1  . metoprolol succinate (TOPROL-XL) 50 MG 24 hr tablet Take 50 mg by mouth daily. Take with or immediately following a meal.    . ondansetron (ZOFRAN ODT) 4 MG disintegrating tablet Take 1 tablet (4 mg total) by mouth every 8 (eight) hours as needed for nausea or vomiting. 20 tablet 0  . pantoprazole (PROTONIX) 40 MG tablet Take 40 mg by mouth daily.    . potassium chloride SA (K-DUR,KLOR-CON) 20 MEQ tablet Take 20 mEq by mouth 2 (two) times daily.   2  . SYMBICORT  160-4.5 MCG/ACT inhaler Inhale 2 puffs into the lungs 2 (two) times daily.   4  . torsemide (DEMADEX) 20 MG tablet Take 2 tablets (40 mg total) by mouth daily. 120 tablet 1  . VENTOLIN HFA  108 (90 BASE) MCG/ACT inhaler Inhale 2 puffs into the lungs every 6 (six) hours as needed for wheezing or shortness of breath.   0   No current facility-administered medications for this visit.     Review of Systems  Constitutional: Negative for chills, fever, malaise/fatigue and weight loss.  HENT: Positive for congestion and hearing loss.   Eyes: Negative.   Respiratory: Positive for cough, sputum production and shortness of breath. Negative for hemoptysis.   Cardiovascular: Negative for chest pain, palpitations and leg swelling.  Gastrointestinal: Negative.   Musculoskeletal: Negative.   Skin: Negative.   Neurological: Negative.   Psychiatric/Behavioral: The patient is nervous/anxious.      PHYSICAL EXAMINATION: Ht 5\' 5"  (1.651 m)   Wt 185 lb (83.9 kg)   BMI 30.79 kg/m  Physical Exam  Constitutional: She is oriented to person, place, and time. She appears well-developed and well-nourished. No distress.  HENT:  Head: Normocephalic and atraumatic.  Eyes: Conjunctivae and EOM are normal.  Neck: Normal range of motion. Neck supple. No tracheal deviation present.    Cardiovascular: Normal rate and regular rhythm.  Murmur heard. Respiratory: Effort normal and breath sounds normal. No respiratory distress. She has no wheezes.    GI: Soft. She exhibits no distension. There is no abdominal tenderness.  Musculoskeletal: Normal range of motion.        General: No edema.  Lymphadenopathy:    She has no cervical adenopathy.  Neurological: She is alert and oriented to person, place, and time.  Skin: Skin is warm and dry. She is not diaphoretic.  Psychiatric: She has a normal mood and affect.    Diagnostic Studies & Laboratory data:     Recent Radiology Findings:  CT Chest 6/24: Slight,  progressive interval enlargement of a lobulated and slightly spiculated nodule of the right pulmonary apex, now measuring 1.9 x 1.4 cm, previously 1.6 x 1.1 cm when measured similarly (series 8, image 28). Moderate underlying centrilobular emphysema. No pleural effusion or pneumothorax. Small, fat containing right-sided Bochdalek's type  PET CT  08/31/18 The solid nodule within the right upper lobe measures 1.9 cm and has an SUV max of 2022.37. On the previous exam this measured 1.1 cm with an SUV max of 10.59. No hypermetabolic right hilar, subcarinal, or right paratracheal adenopathy.  Multifocal areas of ground-glass and airspace consolidation are identified throughout the left upper lobe, new from chest CT dated 08/09/2018. Two small patchy areas of ground-glass and airspace density noted within the left upper lobe. Also new from 08/09/2018.  1 cm left pre-vascular lymph node adjacent to the transverse aortic arch is identified within SUV max of 5.25. On the CT from 08/09/2018 this measured 0.4 cm.  0    I have independently reviewed the above radiology studies  and reviewed the findings with the patient.   Recent Lab Findings: Lab Results  Component Value Date   WBC 5.0 05/02/2018   HGB 12.0 05/02/2018   HCT 38.3 05/02/2018   PLT 232 05/02/2018   GLUCOSE 93 01/11/2018   CHOL 205 (H) 12/13/2016   TRIG 101 12/13/2016   HDL 68 12/13/2016   LDLCALC 117 (H) 12/13/2016   ALT 26 01/01/2018   AST 20 01/01/2018   NA 134 (L) 01/11/2018   K 3.6 01/11/2018   CL 92 (L) 01/11/2018   CREATININE 0.57 01/11/2018   BUN 6 (L) 01/11/2018   CO2 32 01/11/2018   INR 1.0 05/02/2018   HGBA1C 6.2 (H) 12/22/2017  PFTs: - FVC: 50% - FEV1: 43% -DLCO: 59%   Problem List: - Right upper lobe pulmonary nodule concerning for primary lung cancer - Hx of CABG, Redo sternotomy, aortic to right common carotid and right subclavian artery bypass - Poor veins - hx of ICU delirium -  morbid obesity   Assessment / Plan:   63 yo female with a right upper lobe 1.9cm pulmonary nodule, PET avid, concerning for primary lung cancer.  She has had previous CABG, and a redo sternotomy, with potential exploration on the right pleural space.  We discussed several options for diagnosis, and potential treatment of this lesion.  We have agreed to proceed with a Bronchoscopy, Right VATS, Right upper lobe wedge resection, and possible lobectomy for Sept 10th.  She is at risk for air leaks given the likelihood of adhesions.  She will also require a stress test prior to surgery.  Also I will request that a central line be placed given her poor venous access     I  spent 40 minutes with  the patient face to face and greater then 50% of the time was spent in counseling and coordination of care.    Lajuana Matte 10/13/2018 9:44 AM

## 2018-10-17 ENCOUNTER — Other Ambulatory Visit: Payer: Self-pay

## 2018-10-17 ENCOUNTER — Ambulatory Visit (HOSPITAL_COMMUNITY)
Admission: RE | Admit: 2018-10-17 | Discharge: 2018-10-17 | Disposition: A | Discharge status: Home / Self Care | Payer: Medicare HMO | Source: Ambulatory Visit | Attending: Thoracic Surgery (Cardiothoracic Vascular Surgery) | Admitting: Thoracic Surgery (Cardiothoracic Vascular Surgery)

## 2018-10-17 DIAGNOSIS — Z8673 Personal history of transient ischemic attack (TIA), and cerebral infarction without residual deficits: Secondary | ICD-10-CM | POA: Diagnosis not present

## 2018-10-17 DIAGNOSIS — Z6830 Body mass index (BMI) 30.0-30.9, adult: Secondary | ICD-10-CM | POA: Diagnosis not present

## 2018-10-17 DIAGNOSIS — Z87891 Personal history of nicotine dependence: Secondary | ICD-10-CM | POA: Diagnosis not present

## 2018-10-17 DIAGNOSIS — Z8249 Family history of ischemic heart disease and other diseases of the circulatory system: Secondary | ICD-10-CM | POA: Insufficient documentation

## 2018-10-17 DIAGNOSIS — Z86718 Personal history of other venous thrombosis and embolism: Secondary | ICD-10-CM | POA: Insufficient documentation

## 2018-10-17 DIAGNOSIS — R911 Solitary pulmonary nodule: Secondary | ICD-10-CM

## 2018-10-17 DIAGNOSIS — J449 Chronic obstructive pulmonary disease, unspecified: Secondary | ICD-10-CM | POA: Diagnosis not present

## 2018-10-17 DIAGNOSIS — Z951 Presence of aortocoronary bypass graft: Secondary | ICD-10-CM | POA: Diagnosis not present

## 2018-10-17 DIAGNOSIS — I252 Old myocardial infarction: Secondary | ICD-10-CM | POA: Diagnosis not present

## 2018-10-17 DIAGNOSIS — I509 Heart failure, unspecified: Secondary | ICD-10-CM | POA: Insufficient documentation

## 2018-10-17 DIAGNOSIS — E669 Obesity, unspecified: Secondary | ICD-10-CM | POA: Diagnosis not present

## 2018-10-17 LAB — MYOCARDIAL PERFUSION IMAGING
LV dias vol: 64 mL (ref 46–106)
LV sys vol: 28 mL
Peak HR: 90 {beats}/min
Rest HR: 72 {beats}/min
SDS: 2
SRS: 1
SSS: 3
TID: 1.07

## 2018-10-17 MED ORDER — TECHNETIUM TC 99M TETROFOSMIN IV KIT
32.6000 | PACK | Freq: Once | INTRAVENOUS | Status: AC | PRN
  Administered 2018-10-17: 32.6 via INTRAVENOUS
  Filled 2018-10-17: qty 33

## 2018-10-17 MED ORDER — REGADENOSON 0.4 MG/5ML IV SOLN
0.4000 mg | Freq: Once | INTRAVENOUS | Status: AC
  Administered 2018-10-17: 0.4 mg via INTRAVENOUS

## 2018-10-17 MED ORDER — TECHNETIUM TC 99M TETROFOSMIN IV KIT
9.9000 | PACK | Freq: Once | INTRAVENOUS | Status: AC | PRN
  Administered 2018-10-17: 9.9 via INTRAVENOUS
  Filled 2018-10-17: qty 10

## 2018-10-20 NOTE — Pre-Procedure Instructions (Signed)
[place], [place] [address] Phone: [phone] Fax: [phone]  [id number] - [place], [place] [address] [address] Phone: [phone] Fax: [phone]  [facility] [phone] - [place], [place] [address]  [place] [address] Phone: [phone] Fax: [phone]    Your procedure is scheduled on Thurs., Sept. 10, 2020 from 7:30AM-1:35PM  Report to [facility] Entrance "A" at 5:30AM  Call this number if you have problems the morning of surgery:  [phone]   Remember:  Do not eat or drink after midnight on Sept. 9th    Take these medicines the morning of surgery with A SIP OF WATER: ALPRAZolam (XANAX) Atorvastatin (LIPITOR)  Metoprolol succinate (TOPROL-XL)  Pantoprazole (PROTONIX) SYMBICORT-bring with you the day of surgery   If Needed: VENTOLIN HFA Inhaler-bring with you the day of surgery  Follow your surgeon's instructions on when to stop Aspirin.  If no instructions were given by your surgeon then you will need to call the office to get those instructions.     As of today, stop taking all Aspirin (unless instructed by your doctor) and Other Aspirin containing products, Vitamins, Fish oils, and Herbal medications. Also stop all NSAIDS i.e. Advil, Ibuprofen, Motrin, Aleve, Anaprox, Naproxen, BC, Goody Powders, and all Supplements.  Special instructions:   [place]- Preparing For Surgery  Before surgery, you can play an important role. Because skin is not sterile, your skin needs to be as free of germs as possible. You can reduce the number of germs on your skin by washing with CHG (chlorahexidine gluconate) Soap before surgery.  CHG is an antiseptic cleaner which kills germs and bonds with the skin to continue killing germs even after washing.    Please do not  use if you have an allergy to CHG or antibacterial soaps. If your skin becomes reddened/irritated stop using the CHG.  Do not shave (including legs and underarms) for at least 48 hours prior to first CHG shower. It is OK to shave your face.  Please follow these instructions carefully.   1. Shower the NIGHT BEFORE SURGERY and the MORNING OF SURGERY with CHG.   2. If you chose to wash your hair, wash your hair first as usual with your normal shampoo.  3. After you shampoo, rinse your hair and body thoroughly to remove the shampoo.  4. Use CHG as you would any other liquid soap. You can apply CHG directly to the skin and wash gently with a scrungie or a clean washcloth.   5. Apply the CHG Soap to your body ONLY FROM THE NECK DOWN.  Do not use on open wounds or open sores. Avoid contact with your eyes, ears, mouth and genitals (private parts). Wash Face and genitals (private parts)  with your normal soap.  6. Wash thoroughly, paying special attention to the area where your surgery will be performed.  7. Thoroughly rinse your body with warm water from the neck down.  8. DO NOT shower/wash with your normal soap after using and rinsing off the CHG Soap.  9. Pat yourself dry with a CLEAN TOWEL.  10. Wear CLEAN PAJAMAS to bed the night before surgery, wear comfortable clothes the morning of surgery  11. Place CLEAN SHEETS on your bed the night of your first shower and DO NOT  SLEEP WITH PETS.   Day of Surgery:             Remember to brush your teeth WITH YOUR REGULAR TOOTHPASTE.  Do not wear jewelry, make-up or nail polish.  Do not wear lotions, powders, or perfumes, or deodorant.  Do not shave 48 hours prior to surgery.    Do not bring valuables to the hospital.  [facility] is not responsible for any belongings or valuables.  Contacts, dentures or bridgework may not be worn into surgery.   For patients admitted to the hospital, discharge time will be determined by your treatment  team.  Patients discharged the day of surgery will not be allowed to drive home.   Please wear clean clothes to the hospital/surgery center.    Please read over the following fact sheets that you were given. Pain Booklet, Coughing and Deep Breathing, Blood Transfusion Information, MRSA Information and Surgical Site Infection Prevention

## 2018-10-24 ENCOUNTER — Other Ambulatory Visit: Payer: Self-pay

## 2018-10-24 ENCOUNTER — Ambulatory Visit (HOSPITAL_COMMUNITY)
Admission: RE | Admit: 2018-10-24 | Discharge: 2018-10-24 | Disposition: A | Discharge status: Home / Self Care | Payer: Medicare HMO | Source: Ambulatory Visit | Attending: Thoracic Surgery (Cardiothoracic Vascular Surgery) | Admitting: Thoracic Surgery (Cardiothoracic Vascular Surgery)

## 2018-10-24 ENCOUNTER — Other Ambulatory Visit (HOSPITAL_COMMUNITY)
Admission: RE | Admit: 2018-10-24 | Discharge: 2018-10-24 | Disposition: A | Discharge status: Home / Self Care | Payer: Medicare HMO | Source: Ambulatory Visit | Attending: Thoracic Surgery (Cardiothoracic Vascular Surgery) | Admitting: Thoracic Surgery (Cardiothoracic Vascular Surgery)

## 2018-10-24 ENCOUNTER — Encounter (HOSPITAL_COMMUNITY): Payer: Self-pay

## 2018-10-24 ENCOUNTER — Encounter (HOSPITAL_COMMUNITY)
Admission: RE | Admit: 2018-10-24 | Discharge: 2018-10-24 | Disposition: A | Discharge status: Home / Self Care | Payer: Medicare HMO | Source: Ambulatory Visit | Attending: Thoracic Surgery (Cardiothoracic Vascular Surgery) | Admitting: Thoracic Surgery (Cardiothoracic Vascular Surgery)

## 2018-10-24 DIAGNOSIS — F419 Anxiety disorder, unspecified: Secondary | ICD-10-CM | POA: Diagnosis present

## 2018-10-24 DIAGNOSIS — J439 Emphysema, unspecified: Secondary | ICD-10-CM | POA: Diagnosis not present

## 2018-10-24 DIAGNOSIS — J90 Pleural effusion, not elsewhere classified: Secondary | ICD-10-CM | POA: Diagnosis not present

## 2018-10-24 DIAGNOSIS — Z8701 Personal history of pneumonia (recurrent): Secondary | ICD-10-CM | POA: Diagnosis not present

## 2018-10-24 DIAGNOSIS — Z20828 Contact with and (suspected) exposure to other viral communicable diseases: Secondary | ICD-10-CM | POA: Diagnosis present

## 2018-10-24 DIAGNOSIS — W57XXXA Bitten or stung by nonvenomous insect and other nonvenomous arthropods, initial encounter: Secondary | ICD-10-CM | POA: Diagnosis not present

## 2018-10-24 DIAGNOSIS — Z79899 Other long term (current) drug therapy: Secondary | ICD-10-CM | POA: Diagnosis not present

## 2018-10-24 DIAGNOSIS — T797XXA Traumatic subcutaneous emphysema, initial encounter: Secondary | ICD-10-CM | POA: Diagnosis not present

## 2018-10-24 DIAGNOSIS — R0602 Shortness of breath: Secondary | ICD-10-CM | POA: Diagnosis not present

## 2018-10-24 DIAGNOSIS — Z9049 Acquired absence of other specified parts of digestive tract: Secondary | ICD-10-CM | POA: Diagnosis not present

## 2018-10-24 DIAGNOSIS — I5031 Acute diastolic (congestive) heart failure: Secondary | ICD-10-CM | POA: Diagnosis not present

## 2018-10-24 DIAGNOSIS — Z299 Encounter for prophylactic measures, unspecified: Secondary | ICD-10-CM | POA: Diagnosis not present

## 2018-10-24 DIAGNOSIS — I2510 Atherosclerotic heart disease of native coronary artery without angina pectoris: Secondary | ICD-10-CM | POA: Diagnosis present

## 2018-10-24 DIAGNOSIS — Z4682 Encounter for fitting and adjustment of non-vascular catheter: Secondary | ICD-10-CM | POA: Diagnosis not present

## 2018-10-24 DIAGNOSIS — C3411 Malignant neoplasm of upper lobe, right bronchus or lung: Secondary | ICD-10-CM | POA: Diagnosis not present

## 2018-10-24 DIAGNOSIS — R911 Solitary pulmonary nodule: Secondary | ICD-10-CM

## 2018-10-24 DIAGNOSIS — Z6832 Body mass index (BMI) 32.0-32.9, adult: Secondary | ICD-10-CM | POA: Diagnosis not present

## 2018-10-24 DIAGNOSIS — I69354 Hemiplegia and hemiparesis following cerebral infarction affecting left non-dominant side: Secondary | ICD-10-CM | POA: Diagnosis not present

## 2018-10-24 DIAGNOSIS — Z7982 Long term (current) use of aspirin: Secondary | ICD-10-CM | POA: Diagnosis not present

## 2018-10-24 DIAGNOSIS — J449 Chronic obstructive pulmonary disease, unspecified: Secondary | ICD-10-CM | POA: Diagnosis present

## 2018-10-24 DIAGNOSIS — Z87891 Personal history of nicotine dependence: Secondary | ICD-10-CM | POA: Diagnosis not present

## 2018-10-24 DIAGNOSIS — I4891 Unspecified atrial fibrillation: Secondary | ICD-10-CM | POA: Diagnosis not present

## 2018-10-24 DIAGNOSIS — K219 Gastro-esophageal reflux disease without esophagitis: Secondary | ICD-10-CM | POA: Diagnosis present

## 2018-10-24 DIAGNOSIS — Z01818 Encounter for other preprocedural examination: Secondary | ICD-10-CM | POA: Diagnosis not present

## 2018-10-24 DIAGNOSIS — Z8249 Family history of ischemic heart disease and other diseases of the circulatory system: Secondary | ICD-10-CM | POA: Diagnosis not present

## 2018-10-24 DIAGNOSIS — I739 Peripheral vascular disease, unspecified: Secondary | ICD-10-CM | POA: Diagnosis present

## 2018-10-24 DIAGNOSIS — Z951 Presence of aortocoronary bypass graft: Secondary | ICD-10-CM | POA: Diagnosis not present

## 2018-10-24 DIAGNOSIS — J939 Pneumothorax, unspecified: Secondary | ICD-10-CM | POA: Diagnosis not present

## 2018-10-24 DIAGNOSIS — Z902 Acquired absence of lung [part of]: Secondary | ICD-10-CM | POA: Diagnosis not present

## 2018-10-24 DIAGNOSIS — I252 Old myocardial infarction: Secondary | ICD-10-CM | POA: Diagnosis not present

## 2018-10-24 DIAGNOSIS — F17210 Nicotine dependence, cigarettes, uncomplicated: Secondary | ICD-10-CM | POA: Diagnosis not present

## 2018-10-24 DIAGNOSIS — J9382 Other air leak: Secondary | ICD-10-CM | POA: Diagnosis not present

## 2018-10-24 DIAGNOSIS — I440 Atrioventricular block, first degree: Secondary | ICD-10-CM | POA: Diagnosis not present

## 2018-10-24 DIAGNOSIS — Z7951 Long term (current) use of inhaled steroids: Secondary | ICD-10-CM | POA: Diagnosis not present

## 2018-10-24 HISTORY — DX: Peripheral vascular disease, unspecified: I73.9

## 2018-10-24 HISTORY — DX: Anxiety disorder, unspecified: F41.9

## 2018-10-24 LAB — CBC
HCT: 45.4 % (ref 36.0–46.0)
Hemoglobin: 14.6 g/dL (ref 12.0–15.0)
MCH: 28.7 pg (ref 26.0–34.0)
MCHC: 32.2 g/dL (ref 30.0–36.0)
MCV: 89.4 fL (ref 80.0–100.0)
Platelets: 267 10*3/uL (ref 150–400)
RBC: 5.08 MIL/uL (ref 3.87–5.11)
RDW: 14.4 % (ref 11.5–15.5)
WBC: 8.6 10*3/uL (ref 4.0–10.5)
nRBC: 0 % (ref 0.0–0.2)

## 2018-10-24 LAB — APTT: aPTT: 27 seconds (ref 24–36)

## 2018-10-24 LAB — URINALYSIS, ROUTINE W REFLEX MICROSCOPIC
Bilirubin Urine: NEGATIVE
Glucose, UA: NEGATIVE mg/dL
Hgb urine dipstick: NEGATIVE
Ketones, ur: NEGATIVE mg/dL
Nitrite: NEGATIVE
Protein, ur: NEGATIVE mg/dL
Specific Gravity, Urine: 1.012 (ref 1.005–1.030)
pH: 5 (ref 5.0–8.0)

## 2018-10-24 LAB — COMPREHENSIVE METABOLIC PANEL
ALT: 24 U/L (ref 0–44)
AST: 22 U/L (ref 15–41)
Albumin: 4 g/dL (ref 3.5–5.0)
Alkaline Phosphatase: 95 U/L (ref 38–126)
Anion gap: 12 (ref 5–15)
BUN: 20 mg/dL (ref 8–23)
CO2: 28 mmol/L (ref 22–32)
Calcium: 9.3 mg/dL (ref 8.9–10.3)
Chloride: 98 mmol/L (ref 98–111)
Creatinine, Ser: 0.81 mg/dL (ref 0.44–1.00)
GFR calc Af Amer: >60 mL/min (ref >60)
GFR calc non Af Amer: >60 mL/min (ref >60)
Glucose, Bld: 91 mg/dL (ref 70–99)
Potassium: 3.8 mmol/L (ref 3.5–5.1)
Sodium: 138 mmol/L (ref 135–145)
Total Bilirubin: 0.7 mg/dL (ref 0.3–1.2)
Total Protein: 7.7 g/dL (ref 6.5–8.1)

## 2018-10-24 LAB — TYPE AND SCREEN
ABO/RH(D): A NEG
Antibody Screen: NEGATIVE

## 2018-10-24 LAB — PROTIME-INR
INR: 1 (ref 0.8–1.2)
Prothrombin Time: 12.8 seconds (ref 11.4–15.2)

## 2018-10-24 NOTE — Progress Notes (Signed)
PCP - Dr. Jerene Bears, MD Cardiologist - Dr. Carlyle Dolly, MD  Chest x-ray - 10/24/18 EKG - 10/24/18 Stress Test - 10/17/18 ECHO - 11/23/17 Cardiac Cath - 12/14/16  Sleep Study - denies CPAP - denies  Blood Thinner Instructions: N/A Aspirin Instructions:Patient continues to take her ASA; instructed to continue taking until the DOS per Levonne Spiller, RN/CVTS  Patient has a new left posterior forearm wound from a questionable insect bite.  Patient has been RX Mupirocin topical ointment and Bactrim 800/160mg  BID. CVTS is aware.  Anesthesia review: YES; cardiac hx  Patient denies shortness of breath, fever, cough and chest pain at PAT appointment  Patient had COVID testing done this AM at [facility]; await results  Coronavirus Screening  Have you experienced the following symptoms:  Cough yes/no: No Fever (>100.39F)  yes/no: No Runny nose yes/no: No Sore throat yes/no: No Difficulty breathing/shortness of breath  yes/no: No  Have you or a family member traveled in the last 14 days and where? yes/no: No   If the patient indicates "YES" to the above questions, their PAT will be rescheduled to limit the exposure to others and, the surgeon will be notified. THE PATIENT WILL NEED TO BE ASYMPTOMATIC FOR 14 DAYS.   If the patient is not experiencing any of these symptoms, the PAT nurse will instruct them to NOT bring anyone with them to their appointment since they may have these symptoms or traveled as well.   Please remind your patients and families that hospital visitation restrictions are in effect and the importance of the restrictions.  Patient verbalized understanding of instructions that were given to them at the PAT appointment. Patient was also instructed that they will need to review over the PAT instructions again at home before surgery.

## 2018-10-25 LAB — SURGICAL PCR SCREEN
MRSA, PCR: POSITIVE — AB
Staphylococcus aureus: POSITIVE — AB

## 2018-10-25 LAB — SARS CORONAVIRUS 2 (TAT 6-24 HRS): SARS Coronavirus 2: NEGATIVE

## 2018-10-25 NOTE — Anesthesia Preprocedure Evaluation (Addendum)
Anesthesia Evaluation  Patient identified by MRN, date of birth, ID band Patient awake    Reviewed: Allergy & Precautions, H&P , NPO status , Patient's Chart, lab work & pertinent test results  Airway Mallampati: II   Neck ROM: full    Dental   Pulmonary shortness of breath, asthma , COPD, former smoker,  RUL nodule   breath sounds clear to auscultation       Cardiovascular + CAD, + Past MI, + CABG and + Peripheral Vascular Disease  + Valvular Problems/Murmurs AS  Rhythm:regular Rate:Normal  S/p aortic arch reconstruction TTE: normal EF, mild AS   Neuro/Psych Anxiety CVA    GI/Hepatic GERD  ,  Endo/Other    Renal/GU      Musculoskeletal   Abdominal   Peds  Hematology   Anesthesia Other Findings   Reproductive/Obstetrics                            Anesthesia Physical Anesthesia Plan  ASA: III  Anesthesia Plan: General   Post-op Pain Management:    Induction: Intravenous  PONV Risk Score and Plan: 3 and Ondansetron, Dexamethasone, Midazolam and Treatment may vary due to age or medical condition  Airway Management Planned: Double Lumen EBT  Additional Equipment: Arterial line, CVP and Ultrasound Guidance Line Placement  Intra-op Plan:   Post-operative Plan:   Informed Consent: I have reviewed the patients History and Physical, chart, labs and discussed the procedure including the risks, benefits and alternatives for the proposed anesthesia with the patient or authorized representative who has indicated his/her understanding and acceptance.       Plan Discussed with: CRNA, Anesthesiologist and Surgeon  Anesthesia Plan Comments: (S/p emergency CABG after presenting 11/2016 with NSTEMI. Presentation complicated by cardiogenic shock. She subsequently recovered and was doing well until she developed multilpe TIAs around Sept-Oct 2019. In November 2019 she underwent redo sternotomy,  aortic arch reconstruction for ostial right common carotid stenosis with Dr. Oneida Alar. She has been followed closely by Dr. [place] Gum for hypermetabolic 2 cm right upper lobe nodule suspicious for primary bronchogenic carcinoma and now requires VATS/RUL lobectomy. She was most recently seen by Dr. Kipp Brood 10/13/18 and per his note "She is at risk for air leaks given the likelihood of adhesions.  She will also require a stress test prior to surgery.  Also I will request that a central line be placed given her poor venous access."  Nuclear stress 10/17/18:  The left ventricular ejection fraction is normal (55-65%).  Nuclear stress EF: 56%.  There was no ST segment deviation noted during stress.  The study is normal.  This is a low risk study.  PFT 09/21/18: - FVC: 50% - FEV1: 43% -DLCO: 59%  TTE 11/23/17: - Left ventricle: The cavity size was normal. There was mild   concentric hypertrophy. Systolic function was normal. Wall motion   was normal; there were no regional wall motion abnormalities.   Doppler parameters are consistent with abnormal left ventricular   relaxation (grade 1 diastolic dysfunction). Doppler parameters   are consistent with high ventricular filling pressure. - Aortic valve: Valve mobility was restricted. There was mild   stenosis. There was mild regurgitation. Peak velocity (S): 288   cm/s. Mean gradient (S): 17 mm Hg. Valve area (VTI): 0.83 cm^2.   Valve area (Vmax): 0.85 cm^2. Valve area (Vmean): 0.87 cm^2. - Mitral valve: Transvalvular velocity was within the normal range.   There was no  evidence for stenosis. There was trivial   regurgitation. - Right ventricle: The cavity size was normal. Wall thickness was   normal. Systolic function was normal. - Atrial septum: No defect or patent foramen ovale was identified. - Tricuspid valve: There was mild regurgitation. - Pulmonary arteries: PA peak pressure: 27 mm Hg (S).  )      Anesthesia Quick Evaluation

## 2018-10-26 ENCOUNTER — Encounter (HOSPITAL_COMMUNITY): Payer: Self-pay | Admitting: Certified Registered"

## 2018-10-26 ENCOUNTER — Other Ambulatory Visit: Payer: Self-pay

## 2018-10-26 ENCOUNTER — Inpatient Hospital Stay (HOSPITAL_COMMUNITY): Payer: Medicare HMO | Admitting: Anesthesiology

## 2018-10-26 ENCOUNTER — Inpatient Hospital Stay (HOSPITAL_COMMUNITY): Payer: Medicare HMO

## 2018-10-26 ENCOUNTER — Inpatient Hospital Stay (HOSPITAL_COMMUNITY): Payer: Medicare HMO | Admitting: Physician Assistant

## 2018-10-26 ENCOUNTER — Encounter (HOSPITAL_COMMUNITY)
Admission: RE | Disposition: A | Discharge status: Home / Self Care | Payer: Self-pay | Source: Home / Self Care | Attending: Thoracic Surgery (Cardiothoracic Vascular Surgery)

## 2018-10-26 ENCOUNTER — Inpatient Hospital Stay (HOSPITAL_COMMUNITY)
Admission: RE | Admit: 2018-10-26 | Discharge: 2018-11-01 | DRG: 164 | Disposition: A | Discharge status: Home / Self Care | Payer: Medicare HMO | Attending: Thoracic Surgery (Cardiothoracic Vascular Surgery) | Admitting: Thoracic Surgery (Cardiothoracic Vascular Surgery)

## 2018-10-26 DIAGNOSIS — Z7982 Long term (current) use of aspirin: Secondary | ICD-10-CM

## 2018-10-26 DIAGNOSIS — I69354 Hemiplegia and hemiparesis following cerebral infarction affecting left non-dominant side: Secondary | ICD-10-CM | POA: Diagnosis not present

## 2018-10-26 DIAGNOSIS — Z87891 Personal history of nicotine dependence: Secondary | ICD-10-CM | POA: Diagnosis not present

## 2018-10-26 DIAGNOSIS — Z8701 Personal history of pneumonia (recurrent): Secondary | ICD-10-CM

## 2018-10-26 DIAGNOSIS — C3411 Malignant neoplasm of upper lobe, right bronchus or lung: Principal | ICD-10-CM | POA: Diagnosis present

## 2018-10-26 DIAGNOSIS — I2510 Atherosclerotic heart disease of native coronary artery without angina pectoris: Secondary | ICD-10-CM | POA: Diagnosis not present

## 2018-10-26 DIAGNOSIS — Z9049 Acquired absence of other specified parts of digestive tract: Secondary | ICD-10-CM

## 2018-10-26 DIAGNOSIS — J90 Pleural effusion, not elsewhere classified: Secondary | ICD-10-CM | POA: Diagnosis not present

## 2018-10-26 DIAGNOSIS — Z79899 Other long term (current) drug therapy: Secondary | ICD-10-CM

## 2018-10-26 DIAGNOSIS — F419 Anxiety disorder, unspecified: Secondary | ICD-10-CM | POA: Diagnosis present

## 2018-10-26 DIAGNOSIS — J9382 Other air leak: Secondary | ICD-10-CM | POA: Diagnosis not present

## 2018-10-26 DIAGNOSIS — T797XXA Traumatic subcutaneous emphysema, initial encounter: Secondary | ICD-10-CM | POA: Diagnosis not present

## 2018-10-26 DIAGNOSIS — J439 Emphysema, unspecified: Secondary | ICD-10-CM | POA: Diagnosis not present

## 2018-10-26 DIAGNOSIS — I739 Peripheral vascular disease, unspecified: Secondary | ICD-10-CM | POA: Diagnosis present

## 2018-10-26 DIAGNOSIS — R911 Solitary pulmonary nodule: Secondary | ICD-10-CM

## 2018-10-26 DIAGNOSIS — J449 Chronic obstructive pulmonary disease, unspecified: Secondary | ICD-10-CM | POA: Diagnosis present

## 2018-10-26 DIAGNOSIS — Z4682 Encounter for fitting and adjustment of non-vascular catheter: Secondary | ICD-10-CM | POA: Diagnosis not present

## 2018-10-26 DIAGNOSIS — R0602 Shortness of breath: Secondary | ICD-10-CM | POA: Diagnosis not present

## 2018-10-26 DIAGNOSIS — K219 Gastro-esophageal reflux disease without esophagitis: Secondary | ICD-10-CM | POA: Diagnosis present

## 2018-10-26 DIAGNOSIS — I252 Old myocardial infarction: Secondary | ICD-10-CM | POA: Diagnosis not present

## 2018-10-26 DIAGNOSIS — Z09 Encounter for follow-up examination after completed treatment for conditions other than malignant neoplasm: Secondary | ICD-10-CM

## 2018-10-26 DIAGNOSIS — I440 Atrioventricular block, first degree: Secondary | ICD-10-CM | POA: Diagnosis not present

## 2018-10-26 DIAGNOSIS — Z7951 Long term (current) use of inhaled steroids: Secondary | ICD-10-CM

## 2018-10-26 DIAGNOSIS — J939 Pneumothorax, unspecified: Secondary | ICD-10-CM | POA: Diagnosis not present

## 2018-10-26 DIAGNOSIS — I5031 Acute diastolic (congestive) heart failure: Secondary | ICD-10-CM | POA: Diagnosis not present

## 2018-10-26 DIAGNOSIS — Z951 Presence of aortocoronary bypass graft: Secondary | ICD-10-CM

## 2018-10-26 DIAGNOSIS — Z20828 Contact with and (suspected) exposure to other viral communicable diseases: Secondary | ICD-10-CM | POA: Diagnosis present

## 2018-10-26 DIAGNOSIS — Z902 Acquired absence of lung [part of]: Secondary | ICD-10-CM | POA: Diagnosis not present

## 2018-10-26 DIAGNOSIS — Z8249 Family history of ischemic heart disease and other diseases of the circulatory system: Secondary | ICD-10-CM

## 2018-10-26 HISTORY — PX: LOBECTOMY: SHX5089

## 2018-10-26 HISTORY — PX: VIDEO ASSISTED THORACOSCOPY (VATS)/WEDGE RESECTION: SHX6174

## 2018-10-26 HISTORY — PX: VIDEO BRONCHOSCOPY: SHX5072

## 2018-10-26 LAB — BLOOD GAS, ARTERIAL
Acid-Base Excess: 3.7 mmol/L — ABNORMAL HIGH (ref 0.0–2.0)
Bicarbonate: 29.4 mmol/L — ABNORMAL HIGH (ref 20.0–28.0)
Drawn by: 470591
FIO2: 0.21
O2 Saturation: 96.6 %
Patient temperature: 98.6
pCO2 arterial: 60 mmHg — ABNORMAL HIGH (ref 32.0–48.0)
pH, Arterial: 7.311 — ABNORMAL LOW (ref 7.350–7.450)
pO2, Arterial: 106 mmHg (ref 83.0–108.0)

## 2018-10-26 LAB — POCT I-STAT 7, (LYTES, BLD GAS, ICA,H+H)
Bicarbonate: 28.2 mmol/L — ABNORMAL HIGH (ref 20.0–28.0)
Calcium, Ion: 1.22 mmol/L (ref 1.15–1.40)
HCT: 30 % — ABNORMAL LOW (ref 36.0–46.0)
Hemoglobin: 10.2 g/dL — ABNORMAL LOW (ref 12.0–15.0)
O2 Saturation: 100 %
Patient temperature: 35.6
Potassium: 4.4 mmol/L (ref 3.5–5.1)
Sodium: 138 mmol/L (ref 135–145)
TCO2: 30 mmol/L (ref 22–32)
pCO2 arterial: 59.1 mmHg — ABNORMAL HIGH (ref 32.0–48.0)
pH, Arterial: 7.28 — ABNORMAL LOW (ref 7.350–7.450)
pO2, Arterial: 299 mmHg — ABNORMAL HIGH (ref 83.0–108.0)

## 2018-10-26 SURGERY — BRONCHOSCOPY, VIDEO-ASSISTED
Anesthesia: General | Site: Chest | Laterality: Right

## 2018-10-26 MED ORDER — SODIUM CHLORIDE (PF) 0.9 % IJ SOLN
INTRAMUSCULAR | Status: AC
  Filled 2018-10-26: qty 10

## 2018-10-26 MED ORDER — BUPIVACAINE HCL (PF) 0.5 % IJ SOLN
INTRAMUSCULAR | Status: AC
  Filled 2018-10-26: qty 30

## 2018-10-26 MED ORDER — LACTATED RINGERS IV SOLN
INTRAVENOUS | Status: DC | PRN
  Administered 2018-10-26 (×3): via INTRAVENOUS

## 2018-10-26 MED ORDER — BUPIVACAINE HCL 0.5 % IJ SOLN
INTRAMUSCULAR | Status: DC | PRN
  Administered 2018-10-26: 30 mL

## 2018-10-26 MED ORDER — SODIUM CHLORIDE 0.9 % IV SOLN
INTRAVENOUS | Status: DC | PRN
  Administered 2018-10-26: 21:00:00 via INTRA_ARTERIAL

## 2018-10-26 MED ORDER — METOPROLOL SUCCINATE ER 50 MG PO TB24
50.0000 mg | ORAL_TABLET | Freq: Every day | ORAL | Status: DC
  Administered 2018-10-27 – 2018-11-01 (×6): 50 mg via ORAL
  Filled 2018-10-26 (×6): qty 1

## 2018-10-26 MED ORDER — 0.9 % SODIUM CHLORIDE (POUR BTL) OPTIME
TOPICAL | Status: DC | PRN
  Administered 2018-10-26: 10:00:00 1000 mL

## 2018-10-26 MED ORDER — PROPOFOL 10 MG/ML IV BOLUS
INTRAVENOUS | Status: DC | PRN
  Administered 2018-10-26: 140 mg via INTRAVENOUS

## 2018-10-26 MED ORDER — SUFENTANIL CITRATE 50 MCG/ML IV SOLN
INTRAVENOUS | Status: AC
  Filled 2018-10-26: qty 1

## 2018-10-26 MED ORDER — ALBUMIN HUMAN 5 % IV SOLN
INTRAVENOUS | Status: DC | PRN
  Administered 2018-10-26: 12:00:00 via INTRAVENOUS

## 2018-10-26 MED ORDER — MOMETASONE FURO-FORMOTEROL FUM 200-5 MCG/ACT IN AERO
2.0000 | INHALATION_SPRAY | Freq: Two times a day (BID) | RESPIRATORY_TRACT | Status: DC
  Administered 2018-10-26 – 2018-11-01 (×11): 2 via RESPIRATORY_TRACT
  Filled 2018-10-26: qty 8.8

## 2018-10-26 MED ORDER — MIDAZOLAM HCL 2 MG/2ML IJ SOLN
INTRAMUSCULAR | Status: AC
  Filled 2018-10-26: qty 2

## 2018-10-26 MED ORDER — FENTANYL CITRATE (PF) 100 MCG/2ML IJ SOLN: 25.0000 ug | INTRAMUSCULAR | Status: DC | PRN

## 2018-10-26 MED ORDER — SULFAMETHOXAZOLE-TRIMETHOPRIM 800-160 MG PO TABS
1.0000 | ORAL_TABLET | Freq: Two times a day (BID) | ORAL | Status: DC
  Administered 2018-10-26 – 2018-11-01 (×11): 1 via ORAL
  Filled 2018-10-26 (×13): qty 1

## 2018-10-26 MED ORDER — FENTANYL CITRATE (PF) 100 MCG/2ML IJ SOLN
25.0000 ug | INTRAMUSCULAR | Status: DC | PRN
  Administered 2018-10-26 (×2): 25 ug via INTRAVENOUS

## 2018-10-26 MED ORDER — BUPIVACAINE LIPOSOME 1.3 % IJ SUSP
20.0000 mL | Freq: Once | INTRAMUSCULAR | Status: DC
  Filled 2018-10-26: qty 20

## 2018-10-26 MED ORDER — ROCURONIUM BROMIDE 10 MG/ML (PF) SYRINGE
PREFILLED_SYRINGE | INTRAVENOUS | Status: AC
  Filled 2018-10-26: qty 10

## 2018-10-26 MED ORDER — OXYCODONE HCL 5 MG PO TABS
5.0000 mg | ORAL_TABLET | ORAL | Status: DC | PRN
  Administered 2018-10-26 – 2018-10-28 (×6): 10 mg via ORAL
  Administered 2018-10-28: 16:00:00 5 mg via ORAL
  Administered 2018-10-28 (×2): 10 mg via ORAL
  Filled 2018-10-26 (×3): qty 2
  Filled 2018-10-26: qty 1
  Filled 2018-10-26 (×3): qty 2
  Filled 2018-10-26: qty 1
  Filled 2018-10-26: qty 2

## 2018-10-26 MED ORDER — CEFAZOLIN SODIUM 1 G IJ SOLR
INTRAMUSCULAR | Status: AC
  Filled 2018-10-26: qty 20

## 2018-10-26 MED ORDER — ATORVASTATIN CALCIUM 40 MG PO TABS
40.0000 mg | ORAL_TABLET | Freq: Every day | ORAL | Status: DC
  Administered 2018-10-27 – 2018-11-01 (×6): 40 mg via ORAL
  Filled 2018-10-26 (×6): qty 1

## 2018-10-26 MED ORDER — FENTANYL CITRATE (PF) 100 MCG/2ML IJ SOLN
INTRAMUSCULAR | Status: AC
  Filled 2018-10-26: qty 2

## 2018-10-26 MED ORDER — SUFENTANIL CITRATE 50 MCG/ML IV SOLN
INTRAVENOUS | Status: DC | PRN
  Administered 2018-10-26 (×5): 10 ug via INTRAVENOUS
  Administered 2018-10-26 (×2): 5 ug via INTRAVENOUS

## 2018-10-26 MED ORDER — ROCURONIUM BROMIDE 10 MG/ML (PF) SYRINGE
PREFILLED_SYRINGE | INTRAVENOUS | Status: DC | PRN
  Administered 2018-10-26 (×2): 10 mg via INTRAVENOUS
  Administered 2018-10-26: 50 mg via INTRAVENOUS
  Administered 2018-10-26 (×2): 10 mg via INTRAVENOUS
  Administered 2018-10-26: 20 mg via INTRAVENOUS
  Administered 2018-10-26 (×2): 10 mg via INTRAVENOUS

## 2018-10-26 MED ORDER — OXYCODONE HCL 5 MG PO TABS: 5.0000 mg | ORAL_TABLET | Freq: Once | ORAL | Status: DC | PRN

## 2018-10-26 MED ORDER — SUGAMMADEX SODIUM 200 MG/2ML IV SOLN
INTRAVENOUS | Status: DC | PRN
  Administered 2018-10-26: 100 mg via INTRAVENOUS

## 2018-10-26 MED ORDER — LIDOCAINE 2% (20 MG/ML) 5 ML SYRINGE
INTRAMUSCULAR | Status: AC
  Filled 2018-10-26: qty 5

## 2018-10-26 MED ORDER — HEMOSTATIC AGENTS (NO CHARGE) OPTIME
TOPICAL | Status: DC | PRN
  Administered 2018-10-26: 1 via TOPICAL

## 2018-10-26 MED ORDER — PHENYLEPHRINE 40 MCG/ML (10ML) SYRINGE FOR IV PUSH (FOR BLOOD PRESSURE SUPPORT)
PREFILLED_SYRINGE | INTRAVENOUS | Status: AC
  Filled 2018-10-26: qty 10

## 2018-10-26 MED ORDER — ONDANSETRON HCL 4 MG/2ML IJ SOLN
INTRAMUSCULAR | Status: DC | PRN
  Administered 2018-10-26: 4 mg via INTRAVENOUS

## 2018-10-26 MED ORDER — OXYCODONE HCL 5 MG/5ML PO SOLN: 5.0000 mg | Freq: Once | ORAL | Status: DC | PRN

## 2018-10-26 MED ORDER — ONDANSETRON HCL 4 MG/2ML IJ SOLN
4.0000 mg | Freq: Four times a day (QID) | INTRAMUSCULAR | Status: DC | PRN
  Administered 2018-10-27 – 2018-10-29 (×2): 4 mg via INTRAVENOUS
  Filled 2018-10-26 (×2): qty 2

## 2018-10-26 MED ORDER — CEFAZOLIN SODIUM-DEXTROSE 2-4 GM/100ML-% IV SOLN
2.0000 g | INTRAVENOUS | Status: AC
  Administered 2018-10-26 (×2): 2 g via INTRAVENOUS

## 2018-10-26 MED ORDER — MUPIROCIN 2 % EX OINT
1.0000 "application " | TOPICAL_OINTMENT | Freq: Two times a day (BID) | CUTANEOUS | Status: DC
  Administered 2018-10-26 – 2018-11-01 (×5): 1 via NASAL
  Filled 2018-10-26 (×3): qty 22

## 2018-10-26 MED ORDER — DEXAMETHASONE SODIUM PHOSPHATE 10 MG/ML IJ SOLN
INTRAMUSCULAR | Status: DC | PRN
  Administered 2018-10-26: 10 mg via INTRAVENOUS

## 2018-10-26 MED ORDER — ENOXAPARIN SODIUM 40 MG/0.4ML ~~LOC~~ SOLN
40.0000 mg | Freq: Every day | SUBCUTANEOUS | Status: DC
  Administered 2018-10-27 – 2018-11-01 (×6): 40 mg via SUBCUTANEOUS
  Filled 2018-10-26 (×6): qty 0.4

## 2018-10-26 MED ORDER — TRAMADOL HCL 50 MG PO TABS
50.0000 mg | ORAL_TABLET | Freq: Four times a day (QID) | ORAL | Status: DC | PRN
  Administered 2018-10-26: 50 mg via ORAL
  Administered 2018-10-27: 100 mg via ORAL
  Administered 2018-10-27 – 2018-10-29 (×2): 50 mg via ORAL
  Administered 2018-10-31 – 2018-11-01 (×3): 100 mg via ORAL
  Filled 2018-10-26 (×3): qty 2
  Filled 2018-10-26 (×3): qty 1
  Filled 2018-10-26: qty 2

## 2018-10-26 MED ORDER — ONDANSETRON HCL 4 MG/2ML IJ SOLN
INTRAMUSCULAR | Status: AC
  Filled 2018-10-26: qty 2

## 2018-10-26 MED ORDER — ACETAMINOPHEN 160 MG/5ML PO SOLN
1000.0000 mg | Freq: Four times a day (QID) | ORAL | Status: AC
  Administered 2018-10-28: 16:00:00 1000 mg via ORAL

## 2018-10-26 MED ORDER — ACETAMINOPHEN 500 MG PO TABS
1000.0000 mg | ORAL_TABLET | Freq: Four times a day (QID) | ORAL | Status: AC
  Administered 2018-10-26 – 2018-10-31 (×16): 1000 mg via ORAL
  Filled 2018-10-26 (×18): qty 2

## 2018-10-26 MED ORDER — ASPIRIN 81 MG PO CHEW
81.0000 mg | CHEWABLE_TABLET | Freq: Every day | ORAL | Status: DC
  Administered 2018-10-27 – 2018-11-01 (×6): 81 mg via ORAL
  Filled 2018-10-26 (×6): qty 1

## 2018-10-26 MED ORDER — PANTOPRAZOLE SODIUM 40 MG PO TBEC
40.0000 mg | DELAYED_RELEASE_TABLET | Freq: Every day | ORAL | Status: DC
  Administered 2018-10-27 – 2018-11-01 (×6): 40 mg via ORAL
  Filled 2018-10-26 (×6): qty 1

## 2018-10-26 MED ORDER — DEXAMETHASONE SODIUM PHOSPHATE 10 MG/ML IJ SOLN
INTRAMUSCULAR | Status: AC
  Filled 2018-10-26: qty 1

## 2018-10-26 MED ORDER — SENNOSIDES-DOCUSATE SODIUM 8.6-50 MG PO TABS
1.0000 | ORAL_TABLET | Freq: Every day | ORAL | Status: DC
  Administered 2018-10-26 – 2018-10-31 (×5): 1 via ORAL
  Filled 2018-10-26 (×5): qty 1

## 2018-10-26 MED ORDER — BUPIVACAINE LIPOSOME 1.3 % IJ SUSP
INTRAMUSCULAR | Status: DC | PRN
  Administered 2018-10-26: 20 mL

## 2018-10-26 MED ORDER — SODIUM CHLORIDE (PF) 0.9 % IJ SOLN
INTRAMUSCULAR | Status: DC | PRN
  Administered 2018-10-26: 50 mL via INTRAVENOUS

## 2018-10-26 MED ORDER — ONDANSETRON HCL 4 MG/2ML IJ SOLN: 4.0000 mg | Freq: Four times a day (QID) | INTRAMUSCULAR | Status: DC | PRN

## 2018-10-26 MED ORDER — CEFAZOLIN SODIUM-DEXTROSE 2-4 GM/100ML-% IV SOLN
2.0000 g | Freq: Three times a day (TID) | INTRAVENOUS | Status: AC
  Administered 2018-10-26 – 2018-10-27 (×2): 2 g via INTRAVENOUS
  Filled 2018-10-26 (×2): qty 100

## 2018-10-26 MED ORDER — PROPOFOL 10 MG/ML IV BOLUS
INTRAVENOUS | Status: AC
  Filled 2018-10-26: qty 20

## 2018-10-26 MED ORDER — MIDAZOLAM HCL 5 MG/5ML IJ SOLN
INTRAMUSCULAR | Status: DC | PRN
  Administered 2018-10-26 (×2): 1 mg via INTRAVENOUS

## 2018-10-26 MED ORDER — ALPRAZOLAM 0.5 MG PO TABS
0.5000 mg | ORAL_TABLET | Freq: Three times a day (TID) | ORAL | Status: DC
  Administered 2018-10-26 – 2018-11-01 (×19): 0.5 mg via ORAL
  Filled 2018-10-26 (×19): qty 1

## 2018-10-26 MED ORDER — BISACODYL 5 MG PO TBEC
10.0000 mg | DELAYED_RELEASE_TABLET | Freq: Every day | ORAL | Status: DC
  Administered 2018-10-26 – 2018-11-01 (×5): 10 mg via ORAL
  Filled 2018-10-26 (×6): qty 2

## 2018-10-26 MED ORDER — PHENYLEPHRINE HCL (PRESSORS) 10 MG/ML IV SOLN
INTRAVENOUS | Status: DC | PRN
  Administered 2018-10-26 (×2): 80 ug via INTRAVENOUS

## 2018-10-26 MED ORDER — ALBUTEROL SULFATE (2.5 MG/3ML) 0.083% IN NEBU: 2.5000 mg | INHALATION_SOLUTION | Freq: Four times a day (QID) | RESPIRATORY_TRACT | Status: DC | PRN

## 2018-10-26 SURGICAL SUPPLY — 102 items
APPLICATOR COTTON TIP 6 STRL (MISCELLANEOUS) ×2 IMPLANT
APPLICATOR COTTON TIP 6IN STRL (MISCELLANEOUS) ×4
APPLIER CLIP ROT 10 11.4 M/L (STAPLE)
BLADE CLIPPER SURG (BLADE) IMPLANT
CANISTER SUCT 3000ML PPV (MISCELLANEOUS) ×4 IMPLANT
CATH ROBINSON RED A/P 14FR (CATHETERS) ×4 IMPLANT
CATH THORACIC 28FR (CATHETERS) ×4 IMPLANT
CATH THORACIC 28FR RT ANG (CATHETERS) IMPLANT
CATH THORACIC 36FR (CATHETERS) IMPLANT
CATH THORACIC 36FR RT ANG (CATHETERS) IMPLANT
CATH TROCAR 20FR (CATHETERS) IMPLANT
CLIP APPLIE ROT 10 11.4 M/L (STAPLE) IMPLANT
CLIP VESOCCLUDE MED 6/CT (CLIP) IMPLANT
CONN ST 1/4X3/8  BEN (MISCELLANEOUS)
CONN ST 1/4X3/8 BEN (MISCELLANEOUS) IMPLANT
CONN Y 3/8X3/8X3/8  BEN (MISCELLANEOUS)
CONN Y 3/8X3/8X3/8 BEN (MISCELLANEOUS) IMPLANT
CONT SPEC 4OZ CLIKSEAL STRL BL (MISCELLANEOUS) ×20 IMPLANT
COVER SURGICAL LIGHT HANDLE (MISCELLANEOUS) IMPLANT
COVER WAND RF STERILE (DRAPES) IMPLANT
DERMABOND ADVANCED (GAUZE/BANDAGES/DRESSINGS) ×2
DERMABOND ADVANCED .7 DNX12 (GAUZE/BANDAGES/DRESSINGS) ×2 IMPLANT
DISSECTOR BLUNT TIP ENDO 5MM (MISCELLANEOUS) IMPLANT
DRAIN CHANNEL 28F RND 3/8 FF (WOUND CARE) IMPLANT
DRAIN CHANNEL 32F RND 10.7 FF (WOUND CARE) IMPLANT
DRAPE SLUSH/WARMER DISC (DRAPES) ×4 IMPLANT
DRAPE WARM FLUID 44X44 (DRAPES) IMPLANT
ELECT BLADE 6.5 EXT (BLADE) ×4 IMPLANT
ELECT REM PT RETURN 9FT ADLT (ELECTROSURGICAL) ×4
ELECTRODE REM PT RTRN 9FT ADLT (ELECTROSURGICAL) ×2 IMPLANT
GAUZE SPONGE 4X4 12PLY STRL (GAUZE/BANDAGES/DRESSINGS) ×4 IMPLANT
GAUZE SPONGE 4X4 12PLY STRL LF (GAUZE/BANDAGES/DRESSINGS) ×4 IMPLANT
GLOVE BIO SURGEON STRL SZ7 (GLOVE) ×8 IMPLANT
GOWN STRL REUS W/ TWL LRG LVL3 (GOWN DISPOSABLE) ×6 IMPLANT
GOWN STRL REUS W/ TWL XL LVL3 (GOWN DISPOSABLE) ×2 IMPLANT
GOWN STRL REUS W/TWL LRG LVL3 (GOWN DISPOSABLE) ×6
GOWN STRL REUS W/TWL XL LVL3 (GOWN DISPOSABLE) ×2
HANDLE STAPLE ENDO GIA SHORT (STAPLE) ×1
HEMOSTAT SURGICEL 2X14 (HEMOSTASIS) IMPLANT
KIT BASIN OR (CUSTOM PROCEDURE TRAY) ×4 IMPLANT
KIT SUCTION CATH 14FR (SUCTIONS) IMPLANT
KIT TURNOVER KIT B (KITS) ×4 IMPLANT
NEEDLE 22X1 1/2 (OR ONLY) (NEEDLE) ×4 IMPLANT
NEEDLE SPNL 18GX3.5 QUINCKE PK (NEEDLE) IMPLANT
NS IRRIG 1000ML POUR BTL (IV SOLUTION) ×12 IMPLANT
PACK CHEST (CUSTOM PROCEDURE TRAY) ×4 IMPLANT
PACK UNIVERSAL I (CUSTOM PROCEDURE TRAY) ×4 IMPLANT
PAD ARMBOARD 7.5X6 YLW CONV (MISCELLANEOUS) ×8 IMPLANT
POUCH ENDO CATCH II 15MM (MISCELLANEOUS) ×8 IMPLANT
POUCH SPECIMEN RETRIEVAL 10MM (ENDOMECHANICALS) ×4 IMPLANT
RELOAD STAPLER GOLD 60MM (STAPLE) ×28 IMPLANT
RELOAD STAPLER GREEN 60MM (STAPLE) ×2 IMPLANT
RELOAD STAPLER WHITE 60MM (STAPLE) IMPLANT
SCISSORS LAP 5X35 DISP (ENDOMECHANICALS) IMPLANT
SEALANT PROGEL (MISCELLANEOUS) IMPLANT
SEALANT SURG COSEAL 4ML (VASCULAR PRODUCTS) IMPLANT
SEALANT SURG COSEAL 8ML (VASCULAR PRODUCTS) IMPLANT
SEALER LIGASURE MARYLAND 30 (ELECTROSURGICAL) ×4 IMPLANT
SET IRRIG TUBING LAPAROSCOPIC (IRRIGATION / IRRIGATOR) IMPLANT
SOL ANTI FOG 6CC (MISCELLANEOUS) ×2 IMPLANT
SOLUTION ANTI FOG 6CC (MISCELLANEOUS) ×2
SPECIMEN JAR MEDIUM (MISCELLANEOUS) IMPLANT
SPONGE INTESTINAL PEANUT (DISPOSABLE) ×8 IMPLANT
SPONGE TONSIL TAPE 1 RFD (DISPOSABLE) ×4 IMPLANT
STAPLE ECHEON FLEX 60 POW ENDO (STAPLE) ×4 IMPLANT
STAPLE RELOAD 2.5MM WHITE (STAPLE) ×28 IMPLANT
STAPLER ENDO GIA 12MM SHORT (STAPLE) ×3 IMPLANT
STAPLER RELOAD GOLD 60MM (STAPLE) ×56
STAPLER RELOAD GREEN 60MM (STAPLE) ×4
STAPLER RELOAD WHITE 60MM (STAPLE)
STAPLER VASCULAR ECHELON 35 (CUTTER) ×4 IMPLANT
STOPCOCK 4 WAY LG BORE MALE ST (IV SETS) ×4 IMPLANT
SUT MNCRL AB 3-0 PS2 18 (SUTURE) IMPLANT
SUT MON AB 2-0 CT1 36 (SUTURE) IMPLANT
SUT PDS AB 1 CTX 36 (SUTURE) IMPLANT
SUT PROLENE 4 0 RB 1 (SUTURE) ×2
SUT PROLENE 4-0 RB1 .5 CRCL 36 (SUTURE) ×2 IMPLANT
SUT SILK  1 MH (SUTURE) ×2
SUT SILK 1 MH (SUTURE) ×2 IMPLANT
SUT SILK 1 TIES 10X30 (SUTURE) ×4 IMPLANT
SUT SILK 2 0 SH (SUTURE) ×4 IMPLANT
SUT SILK 2 0SH CR/8 30 (SUTURE) IMPLANT
SUT VIC AB 1 CTX 36 (SUTURE)
SUT VIC AB 1 CTX36XBRD ANBCTR (SUTURE) IMPLANT
SUT VIC AB 2-0 CT1 27 (SUTURE) ×2
SUT VIC AB 2-0 CT1 TAPERPNT 27 (SUTURE) ×2 IMPLANT
SUT VIC AB 3-0 SH 27 (SUTURE) ×2
SUT VIC AB 3-0 SH 27X BRD (SUTURE) ×2 IMPLANT
SUT VICRYL 0 UR6 27IN ABS (SUTURE) ×4 IMPLANT
SUT VICRYL 2 TP 1 (SUTURE) IMPLANT
SYR 10ML LL (SYRINGE) ×4 IMPLANT
SYR 30ML LL (SYRINGE) ×4 IMPLANT
SYSTEM SAHARA CHEST DRAIN ATS (WOUND CARE) ×4 IMPLANT
TAPE CLOTH 4X10 WHT NS (GAUZE/BANDAGES/DRESSINGS) ×4 IMPLANT
TAPE CLOTH SURG 4X10 WHT LF (GAUZE/BANDAGES/DRESSINGS) ×4 IMPLANT
TIP APPLICATOR SPRAY EXTEND 16 (VASCULAR PRODUCTS) IMPLANT
TOWEL GREEN STERILE (TOWEL DISPOSABLE) ×4 IMPLANT
TOWEL GREEN STERILE FF (TOWEL DISPOSABLE) ×4 IMPLANT
TRAY FOLEY MTR SLVR 16FR STAT (SET/KITS/TRAYS/PACK) ×4 IMPLANT
TROCAR XCEL BLADELESS 5X75MML (TROCAR) ×4 IMPLANT
TUBING EXTENTION W/L.L. (IV SETS) ×4 IMPLANT
WATER STERILE IRR 1000ML POUR (IV SOLUTION) ×4 IMPLANT

## 2018-10-26 NOTE — Anesthesia Procedure Notes (Signed)
Procedure Name: Intubation Date/Time: 10/26/2018 8:04 AM Performed by: Moshe Salisbury, CRNA Pre-anesthesia Checklist: Patient identified, Emergency Drugs available, Suction available and Patient being monitored Patient Re-evaluated:Patient Re-evaluated prior to induction Oxygen Delivery Method: Circle System Utilized Preoxygenation: Pre-oxygenation with 100% oxygen Induction Type: IV induction Ventilation: Mask ventilation without difficulty Laryngoscope Size: Mac and 3 Grade View: Grade II Tube type: Oral Endobronchial tube: Left, Double lumen EBT and EBT position confirmed by auscultation and 37 Fr Number of attempts: 1 Airway Equipment and Method: Stylet and Oral airway Placement Confirmation: ETT inserted through vocal cords under direct vision,  positive ETCO2 and breath sounds checked- equal and bilateral Secured at: 28 cm Tube secured with: Tape Dental Injury: Teeth and Oropharynx as per pre-operative assessment

## 2018-10-26 NOTE — Progress Notes (Signed)
Emily Rocha [address]       [place] [address]             [phone]                                                   [name] [place] Medical Record #[id number] Date of Birth: [birth year]  Referring: Ivin Poot, MD Primary Care: Glenda Chroman, MD Primary Cardiologist: No primary care provider on file.  Chief Complaint:        Chief Complaint  Patient presents with  . Lung Lesion    Surgical eval,     History of Present Illness:     No events since her clinic appointment  Per my last note:  Emily Rocha 63 y.o. female with a history of an enlarging right upper lobe pulmonary nodule that was PET avid.  Of note, she has had some left lung parenchymal avidity, but she also had a pneumonia at the time of the PET/CT.  She currently is feeling much better.  She continues to have an intermittently productive cough, but denies any dyspnea.      Smoking Hx: Quit smoking in 2018 after her heart surgery.   Zubrod Score: At the time of surgery this patient's most appropriate activity status/level should be described as: [x] ?    0    Normal activity, no symptoms [] ?    1    Restricted in physical strenuous activity but ambulatory, able to do out light work [] ?    2    Ambulatory and capable of self care, unable to do work activities, up and about               >50 % of waking hours                              [] ?    3    Only limited self care, in bed greater than 50% of waking hours [] ?    4    Completely disabled, no self care, confined to bed or chair [] ?    5    Moribund       Past Medical History:  Diagnosis Date  . Asthma   . COPD (chronic obstructive pulmonary disease) ([place])   . Coronary artery disease    hx CABG  . GERD (gastroesophageal reflux disease)   . Pneumonia   . Shortness of breath dyspnea   . Stroke [facility])    "mini stroke" numbness and weakness to left side of the body         Past Surgical History:   Procedure Laterality Date  . AORTIC ARCH ANGIOGRAPHY N/A 11/04/2017   Procedure: AORTIC ARCH ANGIOGRAPHY;  Surgeon: Elam Dutch, MD;  Location: [place] [facility];  Service: Cardiovascular;  Laterality: N/A;  . APPENDECTOMY    . APPLICATION OF WOUND VAC N/A 01/06/2018   Procedure: APPLICATION OF WOUND VAC;  Surgeon: Ivin Poot, MD;  Location: [place];  Service: Thoracic;  Laterality: N/A;  . APPLICATION OF WOUND VAC N/A 01/13/2018   Procedure: WOUND VAC CHANGE;  Surgeon: Ivin Poot, MD;  Location: [place];  Service: Thoracic;  Laterality: N/A;  . CAROTID-SUBCLAVIAN BYPASS GRAFT Right 12/26/2017  Procedure: AORTIC TO RIGHT COMMON CAROTID AND RIGHT SUBCLAVIAN  ARTERY  BYPASS;  Surgeon: Elam Dutch, MD;  Location: [place];  Service: Vascular;  Laterality: Right;  . CHOLECYSTECTOMY    . CLOSURE OF DIAPHRAGM  12/26/2017   Procedure: REPAIR OF DIAPHRAGM;  Surgeon: Ivin Poot, MD;  Location: [place];  Service: Thoracic;;  . COLONOSCOPY N/A 10/03/2014   Procedure: COLONOSCOPY;  Surgeon: Rogene Houston, MD;  [facility];  Service: Endoscopy;  Laterality: N/A;  730  . CORONARY ARTERY BYPASS GRAFT N/A 12/14/2016   Procedure: CORONARY ARTERY BYPASS GRAFTING (CABG) x three , using left internal mammary artery and right leg greater saphenous vein harvested endoscopically;  Surgeon: Ivin Poot, MD;  Location: [place];  Service: Open Heart Surgery;  Laterality: N/A;  . ENDARTERECTOMY Right 12/06/2014   Procedure: ENDARTERECTOMY CAROTid;  Surgeon: Elam Dutch, MD;  Location: [facility];  Service: Vascular;  Laterality: Right;  . ESOPHAGOGASTRODUODENOSCOPY N/A 12/03/2016   Procedure: ESOPHAGOGASTRODUODENOSCOPY (EGD);  Surgeon: Rogene Houston, MD;  [facility];  Service: Endoscopy;  Laterality: N/A;  7:30  . LEFT HEART CATH AND CORONARY ANGIOGRAPHY N/A 12/14/2016   Procedure: LEFT HEART CATH AND CORONARY ANGIOGRAPHY;  Surgeon: Burnell Blanks, MD;  Location: [place] [facility];  Service: Cardiovascular;  Laterality: N/A;  . PERIPHERAL VASCULAR CATHETERIZATION N/A 12/05/2014   Procedure:  Carotid Arch Angiography;  Surgeon: Conrad [place], MD;  Location: [place] [facility];  Service: Cardiovascular;  Laterality: N/A;  . STERNAL WOUND DEBRIDEMENT N/A 01/06/2018   Procedure: STERNAL WOUND DEBRIDEMENT;  Surgeon: Ivin Poot, MD;  Location: [place];  Service: Thoracic;  Laterality: N/A;  . STERNOTOMY N/A 12/26/2017   Procedure: REDO STERNOTOMY;  Surgeon: Prescott Gum, Collier Salina, MD;  Location: [place];  Service: Thoracic;  Laterality: N/A;  . TEE WITHOUT CARDIOVERSION N/A 12/14/2016   Procedure: TRANSESOPHAGEAL ECHOCARDIOGRAM (TEE);  Surgeon: Prescott Gum, Collier Salina, MD;  Location: [place];  Service: Open Heart Surgery;  Laterality: N/A;         Family History  Problem Relation Age of Onset  . Heart attack Mother   . Cancer Mother   . Diabetes Mother   . Heart disease Mother   . Heart attack Father   . Heart disease Father      Social History       Tobacco Use  Smoking Status Former Smoker  . Packs/day: 2.00  . Years: 40.00  . Pack years: 80.00  Smokeless Tobacco Never Used    Social History       Substance and Sexual Activity  Alcohol Use No  . Alcohol/week: 0.0 standard drinks     No Known Allergies        Current Outpatient Medications  Medication Sig Dispense Refill  . ALPRAZolam (XANAX) 0.5 MG tablet Take 0.5 mg by mouth 3 (three) times daily as needed for anxiety.     Marland Kitchen aspirin 81 MG chewable tablet Chew 81 mg by mouth daily.    Marland Kitchen atorvastatin (LIPITOR) 40 MG tablet Take 1 tablet (40 mg total) daily by mouth. 30 tablet 1  . metoprolol succinate (TOPROL-XL) 50 MG 24 hr tablet Take 50 mg by mouth daily. Take with or immediately following a meal.    . ondansetron (ZOFRAN ODT) 4 MG disintegrating tablet Take 1 tablet (4 mg total) by mouth every 8 (eight) hours as needed for nausea or  vomiting. 20 tablet 0  . pantoprazole (PROTONIX)  40 MG tablet Take 40 mg by mouth daily.    . potassium chloride SA (K-DUR,KLOR-CON) 20 MEQ tablet Take 20 mEq by mouth 2 (two) times daily.   2  . SYMBICORT 160-4.5 MCG/ACT inhaler Inhale 2 puffs into the lungs 2 (two) times daily.   4  . torsemide (DEMADEX) 20 MG tablet Take 2 tablets (40 mg total) by mouth daily. 120 tablet 1  . VENTOLIN HFA 108 (90 BASE) MCG/ACT inhaler Inhale 2 puffs into the lungs every 6 (six) hours as needed for wheezing or shortness of breath.   0   No current facility-administered medications for this visit.     Review of Systems  Constitutional: Negative for chills, fever, malaise/fatigue and weight loss.  HENT: Positive for congestion and hearing loss.   Eyes: Negative.   Respiratory: Positive for cough, sputum production and shortness of breath. Negative for hemoptysis.   Cardiovascular: Negative for chest pain, palpitations and leg swelling.  Gastrointestinal: Negative.   Musculoskeletal: Negative.   Skin: Negative.   Neurological: Negative.   Psychiatric/Behavioral: The patient is nervous/anxious.      PHYSICAL EXAMINATION: Ht 5\' 5"  (1.651 m)   Wt 185 lb (83.9 kg)   BMI 30.79 kg/m  Physical Exam  Constitutional: She is oriented to person, place, and time. She appears well-developed and well-nourished. No distress.  HENT:  Head: Normocephalic and atraumatic.  Eyes: Conjunctivae and EOM are normal.  Neck: Normal range of motion. Neck supple. No tracheal deviation present.    Cardiovascular: Normal rate and regular rhythm.  Murmur heard. Respiratory: Effort normal and breath sounds normal. No respiratory distress. She has no wheezes.    GI: Soft. She exhibits no distension. There is no abdominal tenderness.  Musculoskeletal: Normal range of motion.        General: No edema.  Lymphadenopathy:    She has no cervical adenopathy.  Neurological: She is alert and oriented to person, place,  and time.  Skin: Skin is warm and dry. She is not diaphoretic.  Psychiatric: She has a normal mood and affect.    Diagnostic Studies & Laboratory data:     Recent Radiology Findings:  CT Chest 6/24: Slight, progressive interval enlargement of a lobulated and slightly spiculated nodule of the right pulmonary apex, now measuring 1.9 x 1.4 cm, previously 1.6 x 1.1 cm when measured similarly (series 8, image 28). Moderate underlying centrilobular emphysema. No pleural effusion or pneumothorax. Small, fat containing right-sided Bochdalek's type  PET CT  08/31/18 The solid nodule within the right upper lobe measures 1.9 cm and has an SUV max of 2022.37. On the previous exam this measured 1.1 cm with an SUV max of 10.59. No hypermetabolic right hilar, subcarinal, or right paratracheal adenopathy.  Multifocal areas of ground-glass and airspace consolidation are identified throughout the left upper lobe, new from chest CT dated 08/09/2018. Two small patchy areas of ground-glass and airspace density noted within the left upper lobe. Also new from 08/09/2018.  1 cm left pre-vascular lymph node adjacent to the transverse aortic arch is identified within SUV max of 5.25. On the CT from 08/09/2018 this measured 0.4 cm.  0    I have independently reviewed the above radiology studies  and reviewed the findings with the patient.   Recent Lab Findings: Recent Labs       Lab Results  Component Value Date   WBC 5.0 05/02/2018   HGB 12.0 05/02/2018   HCT 38.3 05/02/2018   PLT 232 05/02/2018  GLUCOSE 93 01/11/2018   CHOL 205 (H) 12/13/2016   TRIG 101 12/13/2016   HDL 68 12/13/2016   LDLCALC 117 (H) 12/13/2016   ALT 26 01/01/2018   AST 20 01/01/2018   NA 134 (L) 01/11/2018   K 3.6 01/11/2018   CL 92 (L) 01/11/2018   CREATININE 0.57 01/11/2018   BUN 6 (L) 01/11/2018   CO2 32 01/11/2018   INR 1.0 05/02/2018   HGBA1C 6.2 (H) 12/22/2017       PFTs: -  FVC: 50% - FEV1: 43% -DLCO: 59%   Problem List: - Right upper lobe pulmonary nodule concerning for primary lung cancer - Hx of CABG, Redo sternotomy, aortic to right common carotid and right subclavian artery bypass - Poor veins - hx of ICU delirium - morbid obesity   Assessment / Plan:   63 yo female with a right upper lobe 1.9cm pulmonary nodule, PET avid, concerning for primary lung cancer.  She has had previous CABG, and a redo sternotomy, with potential exploration on the right pleural space.  We discussed several options for diagnosis, and potential treatment of this lesion.  We have agreed to proceed with a Bronchoscopy, Right VATS, Right upper lobe wedge resection, and possible lobectomy for Sept 10th.  She is at risk for air leaks given the likelihood of adhesions.       Evaan Tidwell Bary Leriche

## 2018-10-26 NOTE — Anesthesia Procedure Notes (Signed)
Arterial Line Insertion Start/End9/11/2018 7:00 AM, 10/26/2018 7:10 AM Performed by: Moshe Salisbury, CRNA  Patient location: Pre-op. Preanesthetic checklist: patient identified, IV checked, site marked, risks and benefits discussed, surgical consent, monitors and equipment checked, pre-op evaluation, timeout performed and anesthesia consent Lidocaine 1% used for infiltration Right, radial was placed Catheter size: 20 Fr Hand hygiene performed , maximum sterile barriers used  and Seldinger technique used Allen's test indicative of satisfactory collateral circulation Attempts: 1 Procedure performed without using ultrasound guided technique. Following insertion, dressing applied and Biopatch. Post procedure assessment: normal and unchanged  Patient tolerated the procedure well with no immediate complications.

## 2018-10-26 NOTE — Op Note (Signed)
[address]       [place] [address]             [phone]        10/26/2018  Patient:  Emily Rocha Pre-Op Dx: right upper lobe pulmonary nodule   Post-op Dx:  Right upper lobe NSCLC Procedure: - Bronchoscopy - Right Video assisted thoracoscopy - Pneumolysis - Right upper lobe wedge resection - Completion right upper lobectomy lobectomy - Mediastinal lymph node sampling - Intercostal nerve block  Surgeon and Role:      * Lajuana Matte, MD - Primary    * Dr. Prescott Gum, MD - assisting   Anesthesia  general EBL:  500 ml Blood Administration: none Specimen:  Right upper lobe wedge resection, station 11 node, station 7 node.  Right upper lobe  Drains: 53 F argyle chest tube in right chest Counts: correct   Indications: Emily Rocha Emily Rocha y.o.femalewith a history of an enlarging right upper lobe pulmonary nodule that was PET avid. Of note, she has had some left lung parenchymal avidity, but she also had a pneumonia at the time of the PET/CT.  Findings: Normal endobronchial exam.  Wedge resection showed NSCLC on frozen.  Normal anatomy.    Operative Technique: After the risks, benefits and alternatives were thoroughly discussed, the patient was brought to the operative theatre.  Anesthesia was induced, and the bronchoscope was passed through the endotracheal tube.  All segmental bronchi were visualized.  The endotracheal tube was then exchanged for a double lumen tube.  The patient was then placed in a left lateral decubitus position and was prepped and draped in normal sterile fashion.  An appropriate surgical pause was performed, and pre-operative antibiotics were dosed accordingly.  We began with 3cm incision in the anterior axillary line at the 8th intercostal space.  The chest was entered, and we then placed a 1cm incision at the 10th intercostal space, and introduced our camera port.  The lung was directly visualized.  We then made a 4cm  incision in the 2nd intercostal space, and entered the chest under direct visualization.  Previous adhesions were sharply divided.  The lung was palpated, and the upper lobe nodule was found.  It was wedged out and passed off as specimen.  The results showed NSCLC.  The lung was then retracted superiorly, and the inferior pulmonary ligament was divided.  The hilum was mobilized anteriorly and posteriorly.  We identified the upper lobe pulmonary vein, and after careful isolation, it was divided with an endo- GIA stapler.  We next moved to the  Arterial branches to the upper lobe.  The arters were then divided with an endo-GIA stapler.  The bronchus to the upper lobe was then isolated.  After a test clamp, with good ventilation of the middle and lower lobes, the bronchus was then divided.  The fissure was completed, and the specimen was passed into an endocatch bag.  It was removed from the superior access site.    Levels 4 and 7 were explored.  Lymph nodes were then sampled at levels 7.  The chest was irrigated, and an air leak test was performed.  An intercostal nerve block was performed under direct visualization.  A 18F chest with then placed, and we watch the remaining lobes re-expand.  The skin and soft tissue were closed with absorbable suture    The patient tolerated the procedure without any immediate complications, and was transferred to the PACU  in stable condition.  Emily Rocha Emily Rocha

## 2018-10-26 NOTE — Brief Op Note (Signed)
10/26/2018  12:29 PM  PATIENT:  Emily Rocha  63 y.o. female  PRE-OPERATIVE DIAGNOSIS:  RUL PULMONARY NODULE  POST-OPERATIVE DIAGNOSIS:  RUL PULMONARY NODULE  PROCEDURE:  Procedure(s): VIDEO BRONCHOSCOPY (N/A) VIDEO ASSISTED THORACOSCOPY (VATS)/WEDGE RESECTION (Right) RIGHT UPPER LOBECTOMY (Right)  SURGEON:  Surgeon(s) and Role:    * Lightfoot, Lucile Crater, MD - Primary    * Ivin Poot, MD - Assisting  PHYSICIAN ASSISTANT: WAYNE GOLD PA-C  ANESTHESIA:   general  EBL:  750 mL   BLOOD ADMINISTERED:none  DRAINS: 1 38F Chest Tube(s) in the RIGHT HEMITHORAX   LOCAL MEDICATIONS USED:  BUPIVICAINE   SPECIMEN:  Source of Specimen:  RIGHT UPPER LOBE, MULTIPLE LN SAMPLES  DISPOSITION OF SPECIMEN:  PATHOLOGY  COUNTS:  YES  TOURNIQUET:  * No tourniquets in log *  DICTATION: .Dragon Dictation  PLAN OF CARE: Admit to inpatient   PATIENT DISPOSITION:  PACU - hemodynamically stable.   Delay start of Pharmacological VTE agent (>24hrs) due to surgical blood loss or risk of bleeding: yes  COMPLICATIONS: NO KNOWN

## 2018-10-26 NOTE — Transfer of Care (Signed)
Immediate Anesthesia Transfer of Care Note  [name]  Procedure(s) Performed: VIDEO BRONCHOSCOPY (N/A Bronchus) VIDEO ASSISTED THORACOSCOPY (VATS)/WEDGE RESECTION (Right Chest) RIGHT UPPER LOBECTOMY (Right Chest)  Patient Location: PACU  Anesthesia Type:General  Level of Consciousness: drowsy and patient cooperative  Airway & Oxygen Therapy: Patient Spontanous Breathing and Patient connected to nasal cannula oxygen  Post-op Assessment: Report given to RN, Post -op Vital signs reviewed and stable and Patient moving all extremities  Post vital signs: Reviewed and stable  Last Vitals:  Vitals Value Taken Time  BP 85/58 10/26/18 1300  Temp    Pulse 79 10/26/18 1301  Resp 26 10/26/18 1301  SpO2 95 % 10/26/18 1301  Vitals shown include unvalidated device data.  Last Pain:  Vitals:   10/26/18 0636  TempSrc:   PainSc: 0-No pain         Complications: No apparent anesthesia complications

## 2018-10-27 ENCOUNTER — Encounter (HOSPITAL_COMMUNITY): Payer: Self-pay | Admitting: Thoracic Surgery (Cardiothoracic Vascular Surgery)

## 2018-10-27 ENCOUNTER — Inpatient Hospital Stay (HOSPITAL_COMMUNITY): Payer: Medicare HMO

## 2018-10-27 LAB — BLOOD GAS, ARTERIAL
Acid-Base Excess: 0.8 mmol/L (ref 0.0–2.0)
Bicarbonate: 25 mmol/L (ref 20.0–28.0)
Drawn by: 330991
O2 Content: 3 L/min
O2 Saturation: 88.6 %
Patient temperature: 98.6
pCO2 arterial: 40.3 mmHg (ref 32.0–48.0)
pH, Arterial: 7.409 (ref 7.350–7.450)
pO2, Arterial: 50.9 mmHg — ABNORMAL LOW (ref 83.0–108.0)

## 2018-10-27 LAB — CBC
HCT: 30.6 % — ABNORMAL LOW (ref 36.0–46.0)
Hemoglobin: 9.5 g/dL — ABNORMAL LOW (ref 12.0–15.0)
MCH: 28.9 pg (ref 26.0–34.0)
MCHC: 31 g/dL (ref 30.0–36.0)
MCV: 93 fL (ref 80.0–100.0)
Platelets: 222 K/uL (ref 150–400)
RBC: 3.29 MIL/uL — ABNORMAL LOW (ref 3.87–5.11)
RDW: 14.2 % (ref 11.5–15.5)
WBC: 12.6 K/uL — ABNORMAL HIGH (ref 4.0–10.5)
nRBC: 0 % (ref 0.0–0.2)

## 2018-10-27 LAB — BASIC METABOLIC PANEL WITH GFR
Anion gap: 9 (ref 5–15)
BUN: 20 mg/dL (ref 8–23)
CO2: 26 mmol/L (ref 22–32)
Calcium: 8.4 mg/dL — ABNORMAL LOW (ref 8.9–10.3)
Chloride: 99 mmol/L (ref 98–111)
Creatinine, Ser: 0.83 mg/dL (ref 0.44–1.00)
GFR calc Af Amer: >60 mL/min
GFR calc non Af Amer: >60 mL/min
Glucose, Bld: 139 mg/dL — ABNORMAL HIGH (ref 70–99)
Potassium: 4.6 mmol/L (ref 3.5–5.1)
Sodium: 134 mmol/L — ABNORMAL LOW (ref 135–145)

## 2018-10-27 MED ORDER — METOCLOPRAMIDE HCL 5 MG/ML IJ SOLN
10.0000 mg | Freq: Four times a day (QID) | INTRAMUSCULAR | Status: AC
  Administered 2018-10-27 (×3): 10 mg via INTRAVENOUS
  Filled 2018-10-27 (×3): qty 2

## 2018-10-27 MED ORDER — CHLORHEXIDINE GLUCONATE CLOTH 2 % EX PADS
6.0000 | MEDICATED_PAD | Freq: Every day | CUTANEOUS | Status: AC
  Administered 2018-10-27 – 2018-10-31 (×3): 6 via TOPICAL

## 2018-10-27 MED ORDER — ORAL CARE MOUTH RINSE
15.0000 mL | Freq: Two times a day (BID) | OROMUCOSAL | Status: DC
  Administered 2018-10-27 – 2018-11-01 (×8): 15 mL via OROMUCOSAL

## 2018-10-27 MED ORDER — KETOROLAC TROMETHAMINE 15 MG/ML IJ SOLN
15.0000 mg | Freq: Four times a day (QID) | INTRAMUSCULAR | Status: AC
  Administered 2018-10-27 – 2018-10-28 (×3): 15 mg via INTRAVENOUS
  Filled 2018-10-27 (×3): qty 1

## 2018-10-27 MED ORDER — MUPIROCIN 2 % EX OINT
1.0000 "application " | TOPICAL_OINTMENT | Freq: Two times a day (BID) | CUTANEOUS | Status: AC
  Administered 2018-10-27 – 2018-10-31 (×7): 1 via NASAL

## 2018-10-27 NOTE — Discharge Instructions (Signed)
Discharge Instructions:  1. You may shower, please wash incisions daily with soap and water and keep dry.  If you wish to cover wounds with dressing you may do so but please keep clean and change daily.  No tub baths or swimming until incisions have completely healed.  If your incisions become red or develop any drainage please call our office at [phone]  2. No Driving until cleared by Dr. Abran Duke office and you are no longer using narcotic pain medications  3. Fever of 101.5 for at least 24 hours with no source, please contact our office at [phone]  4. Activity- up as tolerated, please walk at least 3 times per day.  Avoid strenuous activity, no lifting, pushing, or pulling with your arms over 8-10 lbs for a minimum of 6 weeks  5. If any questions or concerns arise, please do not hesitate to contact our office at [phone]  Thoracoscopy, Care After This sheet gives you information about how to care for yourself after your procedure. Your health care provider may also give you more specific instructions. If you have problems or questions, contact your health care provider. What can I expect after the procedure? After the procedure, it is common to have pain and soreness in the surgical area. Follow these instructions at home: Incision care   Follow instructions from your health care provider about how to take care of your incision. Make sure you: ? Wash your hands with soap and water before you change your bandage (dressing). If soap and water are not available, use hand sanitizer. ? Change your dressing as told by your health care provider. ? Leave stitches (sutures), skin glue, or adhesive strips in place. These skin closures may need to stay in place for 2 weeks or longer. If adhesive strip edges start to loosen and curl up, you may trim the loose edges. Do not remove adhesive strips completely unless your health care provider tells you to do that.  Check your incision  areas every day for signs of infection. Check for: ? Redness, swelling, or pain. ? Fluid or blood. ? Warmth. ? Pus or a bad smell.  Do not take baths, swim, or use a hot tub until your health care provider approves. You may take showers. Medicines  Take over-the-counter and prescription medicines only as told by your health care provider.  If you were prescribed an antibiotic medicine, take it as told by your health care provider. Do not stop taking the antibiotic even if you start to feel better.  Do not drive or use heavy machinery while taking prescription pain medicine.  If you are taking prescription pain medicine, take actions to prevent or treat constipation. Your health care provider may recommend that you: ? Drink enough fluid to keep your urine pale yellow. ? Eat foods that are high in fiber, such as fresh fruits and vegetables, whole grains, and beans. ? Limit foods that are high in fat and processed sugars, such as fried and sweet foods. ? Take an over-the-counter or prescription medicine for constipation. Managing pain, stiffness, and swelling   If directed, put ice on the affected area: ? Put ice in a plastic bag. ? Place a towel between your skin and the bag. ? Leave the ice on for 20 minutes, 2-3 times a day. Preventing lung infection  To prevent pneumonia and to keep your lungs healthy: ? Try to cough often. If it hurts to cough, hold a pillow against your chest as you cough. ?  Take deep breaths or do breathing exercises as instructed by your health care provider. ? If you were given an incentive spirometer, use it as directed by your health care provider. General instructions  Do not lift anything that is heavier than 10 lb (4.5 kg), or the limit that you are told, until your health care provider says that it is safe.  Do not use any products that contain nicotine or tobacco, such as cigarettes and e-cigarettes. These can delay healing after surgery. If you need  help quitting, ask your health care provider.  Avoid driving until your health care provider approves.  If you have a chest drainage tube, care for it as instructed by your health care provider. Do not travel by airplane after the chest drainage tube is removed until your health care provider approves.  Keep all follow-up visits as told by your health care provider. This is important. Contact a health care provider if:  You have a fever.  Pain medicines do not ease your pain.  You have redness, swelling, or increasing pain in your incision area.  You develop a cough that does not go away, or you are coughing up mucus that is yellow or green. Get help right away if:  You have fluid, blood, or pus coming from your incision.  There is a bad smell coming from your incision or dressing.  You develop a rash.  You cough up blood.  You develop light-headedness, or you feel faint.  You have difficulty breathing.  You develop chest pain.  Your heartbeat feels irregular or very fast. These symptoms may represent a serious problem that is an emergency. Do not wait to see if the symptoms will go away. Get medical help right away. Call your local emergency services (911 in the [place] Do not drive yourself to the hospital. Summary  Follow instructions from your health care provider about how to take care of your incision.  Do not drive or use heavy machinery while taking prescription pain medicine.  Leave stitches (sutures), skin glue, or adhesive strips in place.  Check your incision areas every day for signs of infection. This information is not intended to replace advice given to you by your health care provider. Make sure you discuss any questions you have with your health care provider. Document Released: 08/21/2004 Document Revised: 01/14/2017 Document Reviewed: 01/11/2017 Elsevier Patient Education  2020 [employer].

## 2018-10-27 NOTE — Plan of Care (Signed)
  Problem: Health Behavior/Discharge Planning: Goal: Ability to manage health-related needs will improve Outcome: Progressing   Problem: Clinical Measurements: Goal: Ability to maintain clinical measurements within normal limits will improve Outcome: Progressing Goal: Will remain free from infection Outcome: Progressing Goal: Diagnostic test results will improve Outcome: Progressing Goal: Respiratory complications will improve Outcome: Progressing Goal: Cardiovascular complication will be avoided Outcome: Progressing   Problem: Activity: Goal: Risk for activity intolerance will decrease Outcome: Progressing   Problem: Nutrition: Goal: Adequate nutrition will be maintained Outcome: Progressing   Problem: Coping: Goal: Level of anxiety will decrease Outcome: Progressing   Problem: Elimination: Goal: Will not experience complications related to bowel motility Outcome: Progressing Goal: Will not experience complications related to urinary retention Outcome: Progressing   Problem: Pain Managment: Goal: General experience of comfort will improve Outcome: Progressing   Problem: Safety: Goal: Ability to remain free from injury will improve Outcome: Progressing   Problem: Skin Integrity: Goal: Risk for impaired skin integrity will decrease Outcome: Progressing   Problem: Education: Goal: Knowledge of disease or condition will improve Outcome: Progressing   Problem: Activity: Goal: Risk for activity intolerance will decrease Outcome: Progressing   Problem: Cardiac: Goal: Will achieve and/or maintain hemodynamic stability Outcome: Progressing   Problem: Clinical Measurements: Goal: Postoperative complications will be avoided or minimized Outcome: Progressing   Problem: Respiratory: Goal: Respiratory status will improve Outcome: Progressing   Problem: Pain Management: Goal: Pain level will decrease Outcome: Progressing   Problem: Skin Integrity: Goal: Wound  healing without signs and symptoms infection will improve Outcome: Progressing

## 2018-10-27 NOTE — Progress Notes (Signed)
      [address]       [place] [address]             [phone]       1 Day Post-Op Procedure(s) (LRB): VIDEO BRONCHOSCOPY (N/A) VIDEO ASSISTED THORACOSCOPY (VATS)/WEDGE RESECTION (Right) RIGHT UPPER LOBECTOMY (Right)  Subjective: Patient with incisional pain this am. She also had nausea last evening  Objective: Vital signs in last 24 hours: Temp:  [97.8 F (36.6 C)-98.2 F (36.8 C)] 98 F (36.7 C) (09/11 0315) Pulse Rate:  [77-94] 93 (09/11 0315) Cardiac Rhythm: Normal sinus rhythm (09/11 0700) Resp:  [12-24] 21 (09/11 0315) BP: (85-128)/(44-84) 121/56 (09/11 0315) SpO2:  [93 %-100 %] 99 % (09/11 0315) Arterial Line BP: (108-135)/(48-65) 135/59 (09/10 1528) Weight:  [83.9 kg] 83.9 kg (09/10 1555)     Intake/Output from previous day: 09/10 0701 - 09/11 0700 In: 3709.1 [P.O.:700; I.V.:2409.1; IV Piggyback:450.1] Out: 1860 [Urine:800; Blood:750; Chest Tube:310]   Physical Exam:  Cardiovascular: RRR Pulmonary: Clear to auscultation on left and coarse on the right Abdomen: Soft, non tender, bowel sounds present. Extremities: SCDs in place Wounds: Clean and dry.  No erythema or signs of infection. Chest Tube: to suction, intermittent +1 air leak  Lab Results: CBC: Recent Labs    10/24/18 1540 10/26/18 1241  WBC 8.6  --   HGB 14.6 10.2*  HCT 45.4 30.0*  PLT 267  --    BMET:  Recent Labs    10/24/18 1540 10/26/18 1241  NA 138 138  K 3.8 4.4  CL 98  --   CO2 28  --   GLUCOSE 91  --   BUN 20  --   CREATININE 0.81  --   CALCIUM 9.3  --     PT/INR:  Recent Labs    10/24/18 1540  LABPROT 12.8  INR 1.0   ABG:  INR: Will add last result for INR, ABG once components are confirmed Will add last 4 CBG results once components are confirmed  Assessment/Plan:  1. CV - First degree heart block,SR in the 90's. History of CAD;s/p CABG x 3 2018. On Toprol XL 50 mg daily 2.  Pulmonary - History of COPD. On 3 liters of oxygen via [place].  Chest tube with 310 cc of output since surgery. Chest tube is to suction and there is +1 intermittent air leak. CXR this am appears stable. Chest tube to remain to suction for now. Encourage incentive spirometer. 3. If creatinine WNL this am, will give a few doses of Toradol 4. GI-no abdominal pain. Reglan, Zofran PRN nausea. Will advance diet slowly and as tolerates. Likely related to anesthesia but will monitor  Donielle M ZimmermanPA-C 10/27/2018,7:37 AM [phone]

## 2018-10-27 NOTE — Progress Notes (Signed)
RT attempted to obtain ABG on pt x 2 without success. RT will monitor.

## 2018-10-27 NOTE — Progress Notes (Signed)
Paged dr lightfoot regarding cxr result, new orders rec'd to reapplied on 20 cm suction

## 2018-10-27 NOTE — Discharge Summary (Signed)
Physician Discharge Summary       [address]       [place] [address]             [phone]    Patient ID: Emily Rocha MRN: [id number] DOB/AGE: 08/06/1955 63 y.o.  Admit date: 10/26/2018 Discharge date: 11/01/2018  Admission Diagnoses: 1. Right upper lobe nodule  Discharge Diagnoses:  1. Squamous cell carcinoma RUL 2. S/P lobectomy of lung 3. History of COPD (chronic obstructive pulmonary disease) ([place]) 4. History of GERD (gastroesophageal reflux disease) 5. History of Pneumonia 6. History of Stroke ([place]) 7. History of Coronary artery disease 8. History of asthma 9. History of tobacco abuse-quit 2018  Procedure (s):  Bronchoscopy - Right Video assisted thoracoscopy - Pneumolysis - Right upper lobe wedge resection - Completion right upper lobectomy lobectomy - Mediastinal lymph node sampling - Intercostal nerve block by Dr. Kipp Brood on 10/26/2018.  Pathology: 1. Lung, wedge biopsy/resection, Right upper lobe - MODERATELY TO POORLY DIFFERENTIATED SQUAMOUS CELL CARCINOMA, 2.6 CM - CARCINOMA ABUTS VISCERAL PLEURA BUT DOES NOT INVADE INTO IT - RESECTION MARGINS ARE NEGATIVE FOR CARCINOMA - NEGATIVE FOR LYMPHOVASCULAR OR PERINEURAL INVASION - SEE ONCOLOGY TABLE 2. Lymph node, biopsy, Level 11 - LYMPH NODE, NEGATIVE FOR CARCINOMA (0/1) 3. Lung, resection (segmental or lobe), Right upper lobe - LUNG PARENCHYMA AND BRONCHIAL MARGIN, NEGATIVE FOR CARCINOMA 4. Lymph node, biopsy, Level 7 - BENIGN FIBROADIPOSE TISSUE - NEGATIVE FOR CARCINOMA - LYMPHOID TISSUE IS NOT IDENTIFIED TNM CODE: pT1c, pN0  History of Presenting Illness: Emily Rocha 63 y.o. female with a history of an enlarging right upper lobe pulmonary nodule that was PET avid.  Of note, she has had some left lung parenchymal avidity, but she also had a pneumonia at the time of the PET/CT.  She currently is feeling much better.  She continues to have an intermittently productive cough, but  denies any dyspnea.    Brief Hospital Course:  The patient remained afebrile and hemodynamically stable. A line and foley were removed early in the post operative course. She did have nausea initially post op, but this did resolve. Chest tube output gradually decreased and there was no air leak. Chest tube was placed to water seal on 09/11. Follow up chest x ray showed increased subcutaneous emphysema and possible small right basilar pneumothorax.  Chest tube was placed back to suction. Chest x ray remained stable and air leak resolved. Chest tube was placed to water seal on 09/13. Chest tube was removed on 09/15. Of note, patient had intermittent episodes of confusion. She had this after coronary artery bypass surgery as well. Patient has been requiring oxygen via [place]. She was on 4 liters yesterday and was weaned to 3 liters today.  Patient is tolerating a diet and has had a bowel movement. Wounds are clean and dry. Final chest X ray showed persistent right base opacification (likely small pleural effusion and atelectasis). As discussed with Dr. Kipp Brood, the patient is felt surgically stable for discharge today.   Latest Vital Signs: Blood pressure 123/71, pulse 92, temperature 98.2 F (36.8 C), temperature source Oral, resp. rate (!) 22, height 5\' 5"  (1.651 m), weight 83.9 kg, SpO2 97 %.  Physical Exam: Cardiovascular: Slightly tachycardic Pulmonary: Clear to auscultation on left and slightly diminished right apex Abdomen: Soft, non tender, bowel sounds present. Extremities:SCDs in place Wounds: Clean and dry.  No erythema or signs of infection.  Discharge Condition: Stable and discharged to home.  Recent laboratory  studies:  Lab Results  Component Value Date   WBC 6.9 10/30/2018   HGB 7.2 (L) 10/30/2018   HCT 22.4 (L) 10/30/2018   MCV 91.4 10/30/2018   PLT 171 10/30/2018   Lab Results  Component Value Date   NA 133 (L) 10/28/2018   K 4.7 10/29/2018   CL 98 10/28/2018   CO2 29  10/28/2018   CREATININE 0.88 10/28/2018   GLUCOSE 105 (H) 10/28/2018      Diagnostic Studies: Dg Chest 1 View  Result Date: 10/30/2018 CLINICAL DATA:  Chest tube.  Pneumothorax. EXAM: CHEST  1 VIEW COMPARISON:  10/29/2018; 10/28/2018; chest CT - 08/09/2018 FINDINGS: Grossly unchanged cardiac silhouette and mediastinal contours post median sternotomy and CABG. Stable position of support apparatus. Stable postsurgical change of the right hilum and right lower lung with associated volume loss. Grossly unchanged perihilar and right basilar opacities. No new focal airspace opacities. The amount of right lateral chest wall subcutaneous emphysema is grossly unchanged. No definite pneumothorax. Left midlung linear opacities are unchanged favored to represent atelectasis or scar. No new focal airspace opacities. Unchanged bones. IMPRESSION: 1.  Stable positioning of support apparatus.  No pneumothorax. 2. Stable postsurgical change of the right hilum and lower lung. Electronically Signed   By: Sandi Mariscal M.D.   On: 10/30/2018 07:43   Dg Chest 2 View  Result Date: 11/01/2018 CLINICAL DATA:  Shortness of breath and weakness. EXAM: CHEST - 2 VIEW COMPARISON:  10/31/2018 FINDINGS: Right IJ central venous catheter unchanged. Lungs are adequately inflated with persistent right base opacification which may be due to atelectasis with small effusion versus infection. Slight worsening bilateral perihilar markings suggesting mild vascular congestion. Cardiomediastinal silhouette is unremarkable. Persistent subcutaneous emphysema over the right thorax. Remainder the exam is unchanged. IMPRESSION: Persistent right base opacification likely small effusion with atelectasis although infection is possible. Mild vascular congestion. Right IJ central venous catheter unchanged. Electronically Signed   By: Marin Olp M.D.   On: 11/01/2018 10:25   Dg Chest 2 View  Result Date: 10/25/2018 CLINICAL DATA:  Preop. EXAM: CHEST - 2  VIEW COMPARISON:  10/04/2018 and CT chest 08/09/2018. FINDINGS: Trachea is midline. Heart size normal. Thoracic aorta is calcified. A known apical segment right upper lobe nodule is fairly obscured. Scarring the left perihilar region. Lungs are otherwise clear. No pleural fluid. IMPRESSION: 1. Known right upper lobe nodule is relatively obscured. 2.  Aortic atherosclerosis (ICD10-170.0). Electronically Signed   By: Lorin Picket M.D.   On: 10/25/2018 08:40   Dg Chest 2 View  Result Date: 10/04/2018 CLINICAL DATA:  Right upper lobe nodule. EXAM: CHEST - 2 VIEW COMPARISON:  Radiograph September 13, 2018.  PET scan of August 31, 2018. FINDINGS: The heart size and mediastinal contours are within normal limits. Status post coronary artery bypass graft. No pneumothorax or pleural effusion is noted. Stable opacity is seen in left suprahilar region most consistent with atelectasis or possibly infiltrate. Stable right apical density is noted most consistent with nodule described on prior PET scan. The visualized skeletal structures are unremarkable. IMPRESSION: Stable right apical density is noted consistent with nodule described on prior PET scan which is suspicious for neoplasm or malignancy. Stable left suprahilar density is noted most consistent with subsegmental atelectasis or possibly infiltrate. Electronically Signed   By: Marijo Conception M.D.   On: 10/04/2018 09:49   Dg Chest Port 1 View  Result Date: 10/31/2018 CLINICAL DATA:  Pneumothorax EXAM: PORTABLE CHEST 1 VIEW COMPARISON:  10/31/2018,  8:06 a.m. FINDINGS: Interval removal of a right-sided chest tube. There is no significant pneumothorax. Subcutaneous emphysema remains about the right chest wall. Small right pleural effusion. The left lung is normally aerated. Cardiomegaly status post median sternotomy. Right neck vascular catheter. IMPRESSION: Interval removal of a right-sided chest tube. There is no significant pneumothorax. Subcutaneous emphysema remains  about the right chest wall. Small right pleural effusion. The left lung is normally aerated. Electronically Signed   By: Eddie Candle M.D.   On: 10/31/2018 13:16   Dg Chest Port 1 View  Result Date: 10/31/2018 CLINICAL DATA:  Pneumothorax EXAM: PORTABLE CHEST 1 VIEW COMPARISON:  10/30/2018 FINDINGS: No significant interval change in AP portable examination with postoperative findings of the right chest, right sided chest tube in position, and no significant pneumothorax. Subcutaneous emphysema about the right chest. Right neck vascular catheter. Cardiomegaly status post median sternotomy. IMPRESSION: No significant interval change in AP portable examination with postoperative findings of the right chest, right sided chest tube in position, and no significant pneumothorax. Electronically Signed   By: Eddie Candle M.D.   On: 10/31/2018 09:24   Dg Chest Port 1 View  Result Date: 10/29/2018 CLINICAL DATA:  63 year old female with history of right upper lobectomy on 10/26/2018. EXAM: PORTABLE CHEST 1 VIEW COMPARISON:  Chest x-ray 10/28/2018. FINDINGS: Right-sided chest tube remains in stable position with tip in the apex of the right hemithorax. There is a right-sided internal jugular central venous catheter with tip terminating in the mid superior vena cava. No appreciable right-sided pneumothorax identified on today's examination. Postoperative changes of right upper lobectomy with compensatory hyperexpansion of the right middle and lower lobes. Ill-defined opacities in the remaining right lung, likely reflect resolving areas of postoperative atelectasis. Left lung is clear. No definite pleural effusions. No evidence of pulmonary edema. Heart size is normal. Upper mediastinal contours are distorted on the right side, similar to the prior study, related to recent surgical procedure. Subcutaneous emphysema in the right chest wall. Status post median sternotomy for CABG. IMPRESSION: 1. Support apparatus and  postoperative changes, as above. 2. No definite residual right pneumothorax identified on today's examination. Electronically Signed   By: Vinnie Langton M.D.   On: 10/29/2018 07:20   Dg Chest Port 1 View  Result Date: 10/28/2018 CLINICAL DATA:  63 year old female with history of pneumothorax. EXAM: PORTABLE CHEST 1 VIEW COMPARISON:  Chest x-ray 10/27/2018. FINDINGS: There is a right-sided internal jugular central venous catheter with tip terminating in the mid superior vena cava. Right chest tube with tip terminating in the apex of the right hemithorax. Trace residual right pneumothorax, unchanged. Ill-defined opacities throughout the right mid to lower lung, which may reflect areas of atelectasis and/or consolidation. Left lung is clear. No definite pleural effusions. No evidence of pulmonary edema. Heart size is mildly enlarged. Upper mediastinal contours are within normal limits. Aortic atherosclerosis. Status post median sternotomy for CABG. Postoperative changes of right upper lobectomy. Subcutaneous emphysema in the right chest wall again noted. IMPRESSION: 1. Support apparatus and postoperative changes, as above. 2. Trace residual right pneumothorax. 3. Status post right upper lobectomy with areas of atelectasis and/or consolidation in the right mid to lower lung, similar to the prior examination. Electronically Signed   By: Vinnie Langton M.D.   On: 10/28/2018 07:15   Dg Chest Port 1 View  Result Date: 10/27/2018 CLINICAL DATA:  History of pneumothorax.  Follow-up. EXAM: PORTABLE CHEST 1 VIEW COMPARISON:  Chest radiograph 10/27/2018 FINDINGS: Monitoring leads overlie the  patient. Right IJ central venous catheter tip projects over the superior vena cava. Right chest tube remains in place. Stable cardiac and mediastinal contours status post median sternotomy. Similar-appearing scattered heterogeneous opacities within the mid lower lungs bilaterally. Stable postsurgical changes right hemithorax.  Possible trace right basilar pneumothorax. Subcutaneous emphysema overlying the right chest wall, increased. No pleural effusion. With IMPRESSION: Possible small right basilar pneumothorax. Increased subcutaneous emphysema overlying the right chest wall. Electronically Signed   By: Lovey Newcomer M.D.   On: 10/27/2018 15:43   Dg Chest Port 1 View  Result Date: 10/27/2018 CLINICAL DATA:  Chest tube present EXAM: PORTABLE CHEST 1 VIEW COMPARISON:  Yesterday FINDINGS: Right-sided chest tube with tip at the apex. Postoperative right lung. Right IJ line with tip at the SVC. Cardiomegaly. CABG. Chest wall emphysema on the right. No visible pneumothorax. Mild left perihilar atelectasis or scarring. IMPRESSION: New right chest wall emphysema.  No visible pneumothorax. Electronically Signed   By: Monte Fantasia M.D.   On: 10/27/2018 09:42   Dg Chest Port 1 View  Result Date: 10/26/2018 CLINICAL DATA:  Status post right lobectomy EXAM: PORTABLE CHEST 1 VIEW COMPARISON:  10/24/2018 FINDINGS: Cardiac shadow is stable. Postsurgical changes are again noted. Right jugular central line and right chest tube are seen. Postsurgical changes in the right lung are now seen consistent with the given clinical history. No pneumothorax is noted. No bony abnormality is seen. IMPRESSION: No pneumothorax identified.  Chest tube in satisfactory position. Electronically Signed   By: Inez Catalina M.D.   On: 10/26/2018 15:53         Discharge Medications: Allergies as of 11/01/2018   No Known Allergies     Medication List    TAKE these medications   ALPRAZolam 0.5 MG tablet Commonly known as: XANAX Take 0.5 mg by mouth 3 (three) times daily.   aspirin 81 MG chewable tablet Chew 81 mg by mouth daily.   atorvastatin 40 MG tablet Commonly known as: Lipitor Take 1 tablet (40 mg total) daily by mouth.   diphenhydramine-acetaminophen 25-500 MG Tabs tablet Commonly known as: TYLENOL PM Take 2-3 tablets by mouth at  bedtime.   ferrous sulfate 325 (65 FE) MG tablet Take 1 tablet (325 mg total) by mouth daily with breakfast. For one month. If develop constipation, may stop sooner   guaiFENesin 600 MG 12 hr tablet Commonly known as: MUCINEX Take 1 tablet (600 mg total) by mouth 2 (two) times daily as needed for cough or to loosen phlegm.   metoprolol succinate 50 MG 24 hr tablet Commonly known as: TOPROL-XL Take 50 mg by mouth daily. Take with or immediately following a meal.   multivitamin with minerals Tabs tablet Take 1 tablet by mouth daily. Start taking on: December 01, 2018 What changed: These instructions start on December 01, 2018. If you are unsure what to do until then, ask your doctor or other care provider.   mupirocin ointment 2 % Commonly known as: BACTROBAN Place 1 application into the nose 2 (two) times daily.   pantoprazole 40 MG tablet Commonly known as: PROTONIX Take 40 mg by mouth daily.   potassium chloride SA 20 MEQ tablet Commonly known as: K-DUR Take 20 mEq by mouth daily.   sulfamethoxazole-trimethoprim 800-160 MG tablet Commonly known as: BACTRIM DS Take 1 tablet by mouth 2 (two) times daily.   Symbicort 160-4.5 MCG/ACT inhaler Generic drug: budesonide-formoterol Inhale 2 puffs into the lungs 2 (two) times daily.   torsemide 20 MG  tablet Commonly known as: DEMADEX Take 1 tablet (20 mg total) by mouth daily.   traMADol 50 MG tablet Commonly known as: ULTRAM Take 1 tablet (50 mg total) by mouth every 6 (six) hours as needed (mild pain).   Ventolin HFA 108 (90 Base) MCG/ACT inhaler Generic drug: albuterol Inhale 2 puffs into the lungs every 6 (six) hours as needed for wheezing or shortness of breath.            Durable Medical Equipment  (From admission, onward)         Start     Ordered   11/01/18 1211  For home use only DME oxygen  Once    Question Answer Comment  Length of Need 6 Months   Mode or (Route) Nasal cannula   Liters per Minute 3     Oxygen delivery system Gas      11/01/18 1210          Follow Up Appointments: Follow-up Information    Lajuana Matte, MD. Go on 11/10/2018.   Specialty: Thoracic Surgery Why: Appointment time is at 12:13 pm Contact information: [address] [place] [address] [place] [facility] up.   Why: home oxygen          Signed: Jayshawn Colston M ZimmermanPA-C 11/01/2018, 1:12 PM

## 2018-10-27 NOTE — Progress Notes (Signed)
     [address]       [place], [address]             [phone]       Doing well.  Pain controlled  Vitals:   10/27/18 0804 10/27/18 0914  BP:  125/72  Pulse:  98  Resp:  17  Temp:  (!) 97.5 F (36.4 C)  SpO2: 93% 92%   Alert NAD RRR EWOB, 1 column intermittent air leak  CXR - expanded lung, no pneumothorax  POD 1 s/p R VATS, RULectomy  Doing well Transition to waterseal Pain control Ambulation, and pulm toilet  Emily Rocha

## 2018-10-28 ENCOUNTER — Inpatient Hospital Stay (HOSPITAL_COMMUNITY): Payer: Medicare HMO

## 2018-10-28 LAB — COMPREHENSIVE METABOLIC PANEL
ALT: 77 U/L — ABNORMAL HIGH (ref 0–44)
AST: 53 U/L — ABNORMAL HIGH (ref 15–41)
Albumin: 3 g/dL — ABNORMAL LOW (ref 3.5–5.0)
Alkaline Phosphatase: 89 U/L (ref 38–126)
Anion gap: 6 (ref 5–15)
BUN: 29 mg/dL — ABNORMAL HIGH (ref 8–23)
CO2: 29 mmol/L (ref 22–32)
Calcium: 8.5 mg/dL — ABNORMAL LOW (ref 8.9–10.3)
Chloride: 98 mmol/L (ref 98–111)
Creatinine, Ser: 0.88 mg/dL (ref 0.44–1.00)
GFR calc Af Amer: >60 mL/min (ref >60)
GFR calc non Af Amer: >60 mL/min (ref >60)
Glucose, Bld: 105 mg/dL — ABNORMAL HIGH (ref 70–99)
Potassium: 5 mmol/L (ref 3.5–5.1)
Sodium: 133 mmol/L — ABNORMAL LOW (ref 135–145)
Total Bilirubin: 0.6 mg/dL (ref 0.3–1.2)
Total Protein: 5.4 g/dL — ABNORMAL LOW (ref 6.5–8.1)

## 2018-10-28 LAB — CBC
HCT: 27.3 % — ABNORMAL LOW (ref 36.0–46.0)
Hemoglobin: 8.5 g/dL — ABNORMAL LOW (ref 12.0–15.0)
MCH: 29.3 pg (ref 26.0–34.0)
MCHC: 31.1 g/dL (ref 30.0–36.0)
MCV: 94.1 fL (ref 80.0–100.0)
Platelets: 174 10*3/uL (ref 150–400)
RBC: 2.9 MIL/uL — ABNORMAL LOW (ref 3.87–5.11)
RDW: 14.2 % (ref 11.5–15.5)
WBC: 10.7 10*3/uL — ABNORMAL HIGH (ref 4.0–10.5)
nRBC: 0 % (ref 0.0–0.2)

## 2018-10-28 MED ORDER — FOLIC ACID 1 MG PO TABS
1.0000 mg | ORAL_TABLET | Freq: Every day | ORAL | Status: DC
  Administered 2018-10-28 – 2018-11-01 (×5): 1 mg via ORAL
  Filled 2018-10-28 (×5): qty 1

## 2018-10-28 NOTE — Plan of Care (Signed)
  Problem: Clinical Measurements: Goal: Ability to maintain clinical measurements within normal limits will improve Outcome: Progressing   Problem: Clinical Measurements: Goal: Diagnostic test results will improve Outcome: Progressing   Problem: Clinical Measurements: Goal: Respiratory complications will improve Outcome: Progressing   Problem: Activity: Goal: Risk for activity intolerance will decrease Outcome: Progressing   Problem: Pain Managment: Goal: General experience of comfort will improve Outcome: Progressing

## 2018-10-28 NOTE — Plan of Care (Signed)
  Problem: Clinical Measurements: Goal: Ability to maintain clinical measurements within normal limits will improve Outcome: Progressing   Problem: Clinical Measurements: Goal: Respiratory complications will improve Outcome: Progressing   Problem: Activity: Goal: Risk for activity intolerance will decrease Outcome: Progressing   Problem: Nutrition: Goal: Adequate nutrition will be maintained Outcome: Progressing   Problem: Pain Managment: Goal: General experience of comfort will improve Outcome: Progressing   Problem: Safety: Goal: Ability to remain free from injury will improve Outcome: Progressing

## 2018-10-28 NOTE — Anesthesia Postprocedure Evaluation (Signed)
Anesthesia Post Note  [name]  Procedure(s) Performed: VIDEO BRONCHOSCOPY (N/A Bronchus) VIDEO ASSISTED THORACOSCOPY (VATS)/WEDGE RESECTION (Right Chest) RIGHT UPPER LOBECTOMY (Right Chest)     Patient location during evaluation: PACU Anesthesia Type: General Level of consciousness: awake and alert Pain management: pain level controlled Vital Signs Assessment: post-procedure vital signs reviewed and stable Respiratory status: spontaneous breathing, nonlabored ventilation, respiratory function stable and patient connected to nasal cannula oxygen Cardiovascular status: blood pressure returned to baseline and stable Postop Assessment: no apparent nausea or vomiting Anesthetic complications: no    Last Vitals:  Vitals:   10/28/18 0400 10/28/18 0700  BP:  (!) 106/49  Pulse: 90 93  Resp: 18 15  Temp:  36.9 C  SpO2: 98% 100%    Last Pain:  Vitals:   10/28/18 0700  TempSrc: Oral  PainSc:                  [place]

## 2018-10-28 NOTE — Progress Notes (Addendum)
      [address]       [place] [address]             [phone]       2 Days Post-Op Procedure(s) (LRB): VIDEO BRONCHOSCOPY (N/A) VIDEO ASSISTED THORACOSCOPY (VATS)/WEDGE RESECTION (Right) RIGHT UPPER LOBECTOMY (Right)  Subjective: Patient sleeping and awakened this am. She no longer has nausea.  Objective: Vital signs in last 24 hours: Temp:  [97.5 F (36.4 C)-98.5 F (36.9 C)] 98.4 F (36.9 C) (09/12 0700) Pulse Rate:  [86-98] 93 (09/12 0700) Cardiac Rhythm: Normal sinus rhythm (09/12 0400) Resp:  [15-24] 15 (09/12 0700) BP: (106-125)/(47-72) 106/49 (09/12 0700) SpO2:  [92 %-100 %] 100 % (09/12 0700)     Intake/Output from previous day: 09/11 0701 - 09/12 0700 In: 735 [P.O.:735] Out: 700 [Urine:450; Chest Tube:250]   Physical Exam:  Cardiovascular: RRR Pulmonary: Clear to auscultation on left and coarse on the right Abdomen: Soft, non tender, bowel sounds present. Extremities: SCDs in place Wounds: Clean and dry.  No erythema or signs of infection. Chest Tube: to suction, very intermittent +1 air leak  Lab Results: CBC: Recent Labs    10/27/18 0650 10/28/18 0536  WBC 12.6* 10.7*  HGB 9.5* 8.5*  HCT 30.6* 27.3*  PLT 222 174   BMET:  Recent Labs    10/27/18 0650 10/28/18 0536  NA 134* 133*  K 4.6 5.0  CL 99 98  CO2 26 29  GLUCOSE 139* 105*  BUN 20 29*  CREATININE 0.83 0.88  CALCIUM 8.4* 8.5*    PT/INR:  No results for input(s): LABPROT, INR in the last 72 hours. ABG:  INR: Will add last result for INR, ABG once components are confirmed Will add last 4 CBG results once components are confirmed  Assessment/Plan:  1. CV - First degree heart block,SR in the 90's. History of CAD;s/p CABG x 3 2018. On Toprol XL 50 mg daily 2.  Pulmonary - History of COPD. On 3-4 liters of oxygen via [place]. Chest tube with 250 cc of output since surgery. Chest tube was placed to water seal yesterday but follow up CXR showed increased  subcutaneous emphysema and possible small right basilar ptx. So chest tube placed back to suction and there is +1 very intermittent air leak. CXR this am appears stable (small amount of  subcutaneous emphysema right lateral chest wall). Chest tube to remain to suction for now. Encourage incentive spirometer. 3. Anemia-H and H this am decreased to 8.5 and 27.3  Donielle M ZimmermanPA-C 10/28/2018,7:38 AM [phone]   Chart reviewed, patient examined, agree with above. She feels ok CXR stable. There is a small intermittent air leak. Continue CT to suction.

## 2018-10-29 ENCOUNTER — Inpatient Hospital Stay (HOSPITAL_COMMUNITY): Payer: Medicare HMO

## 2018-10-29 LAB — POTASSIUM: Potassium: 4.7 mmol/L (ref 3.5–5.1)

## 2018-10-29 LAB — CBC
HCT: 23.6 % — ABNORMAL LOW (ref 36.0–46.0)
Hemoglobin: 7.5 g/dL — ABNORMAL LOW (ref 12.0–15.0)
MCH: 29.3 pg (ref 26.0–34.0)
MCHC: 31.8 g/dL (ref 30.0–36.0)
MCV: 92.2 fL (ref 80.0–100.0)
Platelets: 166 10*3/uL (ref 150–400)
RBC: 2.56 MIL/uL — ABNORMAL LOW (ref 3.87–5.11)
RDW: 14.1 % (ref 11.5–15.5)
WBC: 8.2 10*3/uL (ref 4.0–10.5)
nRBC: 0 % (ref 0.0–0.2)

## 2018-10-29 MED ORDER — FUROSEMIDE 40 MG PO TABS
40.0000 mg | ORAL_TABLET | Freq: Once | ORAL | Status: AC
  Administered 2018-10-29: 40 mg via ORAL
  Filled 2018-10-29: qty 1

## 2018-10-29 MED ORDER — FERROUS SULFATE 325 (65 FE) MG PO TABS
325.0000 mg | ORAL_TABLET | Freq: Two times a day (BID) | ORAL | Status: DC
  Administered 2018-10-29 – 2018-11-01 (×7): 325 mg via ORAL
  Filled 2018-10-29 (×7): qty 1

## 2018-10-29 MED ORDER — OXYCODONE HCL 5 MG PO TABS: 5.0000 mg | ORAL_TABLET | Freq: Four times a day (QID) | ORAL | Status: DC | PRN

## 2018-10-29 MED ORDER — GUAIFENESIN ER 600 MG PO TB12
600.0000 mg | ORAL_TABLET | Freq: Two times a day (BID) | ORAL | Status: DC
  Administered 2018-10-29 – 2018-11-01 (×6): 600 mg via ORAL
  Filled 2018-10-29 (×6): qty 1

## 2018-10-29 MED ORDER — KETOROLAC TROMETHAMINE 15 MG/ML IJ SOLN
15.0000 mg | Freq: Four times a day (QID) | INTRAMUSCULAR | Status: AC
  Administered 2018-10-29 (×3): 15 mg via INTRAVENOUS
  Filled 2018-10-29 (×3): qty 1

## 2018-10-29 NOTE — Progress Notes (Signed)
Patient confused and stating she is in pain. Patient received an Oxycodone at 2030 and she started,hallicinatimg about [place]. Patient climbing in and out of bed stating she has to urinate. Advised patient about her chest tube and she laughed and stated it's ok. Patient helped to the [facility] several times and patient refused to call for help. Bed alarm on and patient continues to get OOB. Will sit with patient for her safety and will examine pain meds with MD tomorrow. Patient stated she remembers this happening to her before when she takes pain meds with her Xanax. Monitoring patient closely. VSS.

## 2018-10-29 NOTE — Progress Notes (Signed)
Chest tube noted to have increased tidaling on inspiration following dressing change. Xeroform was packed around tube, no crepitus noted, and patient not reporting any shortness of breath. WCTM and pass on to day team

## 2018-10-29 NOTE — Progress Notes (Signed)
Ambulated along the hallway with front wheel walker and O2 3L [place] with 2 person assist. With shortness of breath on ambulation, with frequent short stops to sit on a  chair to grasp her breath. assisted back to bed,  Pulse ox -90 % continue to monitor.

## 2018-10-29 NOTE — Progress Notes (Addendum)
[address]       [place] [address]             [phone]       3 Days Post-Op Procedure(s) (LRB): VIDEO BRONCHOSCOPY (N/A) VIDEO ASSISTED THORACOSCOPY (VATS)/WEDGE RESECTION (Right) RIGHT UPPER LOBECTOMY (Right)  Subjective: Patient sitting in chair with complaints of "feeling hot", pain on the right (chest tube and incision), and coughing. She had hallucinations after interaction of Xanax and Oxy last evening.  Objective: Vital signs in last 24 hours: Temp:  [97.3 F (36.3 C)-99 F (37.2 C)] 99 F (37.2 C) (09/13 0348) Pulse Rate:  [76-105] 105 (09/13 0348) Cardiac Rhythm: Sinus tachycardia (09/13 0400) Resp:  [16-28] 28 (09/13 0400) BP: (85-121)/(54-69) 118/58 (09/13 0348) SpO2:  [95 %-100 %] 97 % (09/13 0725)     Intake/Output from previous day: 09/12 0701 - 09/13 0700 In: 850 [P.O.:850] Out: 1575 [Urine:1225; Chest Tube:350]   Physical Exam:  Cardiovascular: Tachycardic Pulmonary: Clear to auscultation on left and coarse on the right Abdomen: Soft, non tender, bowel sounds present. Extremities: SCDs in place Wounds: Clean and dry.  No erythema or signs of infection. Chest Tube: to suction, tidling but no air leak  Lab Results: CBC: Recent Labs    10/28/18 0536 10/29/18 0408  WBC 10.7* 8.2  HGB 8.5* 7.5*  HCT 27.3* 23.6*  PLT 174 166   BMET:  Recent Labs    10/27/18 0650 10/28/18 0536  NA 134* 133*  K 4.6 5.0  CL 99 98  CO2 26 29  GLUCOSE 139* 105*  BUN 20 29*  CREATININE 0.83 0.88  CALCIUM 8.4* 8.5*    PT/INR:  No results for input(s): LABPROT, INR in the last 72 hours. ABG:  INR: Will add last result for INR, ABG once components are confirmed Will add last 4 CBG results once components are confirmed  Assessment/Plan:  1. CV - First degree heart block;ST with HR in the 110's (likely related to increased pain this am). History of CAD;s/p CABG x 3 2018. On Toprol XL 50 mg daily 2.  Pulmonary - History of  COPD. On 3-4 liters of oxygen via [place]. Chest tube with 350 cc of output since surgery.  Chest tube is to suction and there is tidling but no air leak. CXR this am appears stable (no ptx, small amount of  subcutaneous emphysema right lateral chest wall). As discussed with Dr. Cyndia Bent, place chest tube to water seal. Encourage incentive spirometer and flutter valve. Mucine for cough. Final path (I did not discuss with patient): 1. Lung, wedge biopsy/resection, Right upper lobe - MODERATELY TO POORLY DIFFERENTIATED SQUAMOUS CELL CARCINOMA, 2.6 CM - CARCINOMA ABUTS VISCERAL PLEURA BUT DOES NOT INVADE INTO IT - RESECTION MARGINS ARE NEGATIVE FOR CARCINOMA - NEGATIVE FOR LYMPHOVASCULAR OR PERINEURAL INVASION - SEE ONCOLOGY TABLE 2. Lymph node, biopsy, Level 11 - LYMPH NODE, NEGATIVE FOR CARCINOMA (0/1) 3. Lung, resection (segmental or lobe), Right upper lobe - LUNG PARENCHYMA AND BRONCHIAL MARGIN, NEGATIVE FOR CARCINOMA 4. Lymph node, biopsy, Level 7 - BENIGN FIBROADIPOSE TISSUE - NEGATIVE FOR CARCINOMA - LYMPHOID TISSUE IS NOT IDENTIFIED TNM Code: pT1c, pN0 3. Anemia-H and H this am decreased to 7.5 and 23.6. ? If lab error. Will start oral ferrous and monitor. 4. Will give a few doses of Toradol to help with pain. Will schedule Oxy for every 6 so as to avoid interaction with Xanax, which in the past this combo has made her  hallucinate. 5. Patient with poor venous access. Will leave central line today and remove in am after lab draw.  6. Repeat potassium this am decreased to 4.7 (5 yesterday)  Donielle M ZimmermanPA-C 10/29/2018,7:34 AM [phone]   Chart reviewed, patient examined, agree with above. CXR clear. No air leak today so put on water seal. Continue IS, wean oxygen

## 2018-10-29 NOTE — Progress Notes (Signed)
Non compliant with instructions to call when getting out of bed and chair . Would just get out of bed and chair not minding of chest tube and other connections for monitoring. Explained to her that if she is not calling for help she might fall and all tubing will come out. Continue to monitor.

## 2018-10-30 ENCOUNTER — Inpatient Hospital Stay (HOSPITAL_COMMUNITY): Payer: Medicare HMO

## 2018-10-30 LAB — CBC
HCT: 22.4 % — ABNORMAL LOW (ref 36.0–46.0)
Hemoglobin: 7.2 g/dL — ABNORMAL LOW (ref 12.0–15.0)
MCH: 29.4 pg (ref 26.0–34.0)
MCHC: 32.1 g/dL (ref 30.0–36.0)
MCV: 91.4 fL (ref 80.0–100.0)
Platelets: 171 10*3/uL (ref 150–400)
RBC: 2.45 MIL/uL — ABNORMAL LOW (ref 3.87–5.11)
RDW: 14.1 % (ref 11.5–15.5)
WBC: 6.9 10*3/uL (ref 4.0–10.5)
nRBC: 0 % (ref 0.0–0.2)

## 2018-10-30 NOTE — Progress Notes (Signed)
Started to become restless claiming" I'm  going home"  Explained to her that she still have the chest tube and still needs to be in the hospital. Very insistent to go home. Made comfortable on bed, back massage  with lotion done, dim the light  and went to sleep. PA made aware, with order. Sitter at bedside. Continue to monitor.

## 2018-10-30 NOTE — Plan of Care (Signed)
Patient continues to be impulsive does not regard chest tube or monitor wires. Patient appears unsteady on her feet and continues to be mildly short of breath with exertion. Remains mildly tachycardic. Needs encouragement to perform IS and flutter. Problem: Health Behavior/Discharge Planning: Goal: Ability to manage health-related needs will improve Outcome: Progressing   Problem: Clinical Measurements: Goal: Ability to maintain clinical measurements within normal limits will improve Outcome: Progressing Goal: Will remain free from infection Outcome: Progressing Goal: Diagnostic test results will improve Outcome: Progressing Goal: Respiratory complications will improve Outcome: Progressing Goal: Cardiovascular complication will be avoided Outcome: Progressing   Problem: Activity: Goal: Risk for activity intolerance will decrease Outcome: Progressing   Problem: Nutrition: Goal: Adequate nutrition will be maintained Outcome: Progressing   Problem: Coping: Goal: Level of anxiety will decrease Outcome: Progressing   Problem: Elimination: Goal: Will not experience complications related to bowel motility Outcome: Progressing Goal: Will not experience complications related to urinary retention Outcome: Progressing   Problem: Pain Managment: Goal: General experience of comfort will improve Outcome: Progressing   Problem: Safety: Goal: Ability to remain free from injury will improve Outcome: Progressing   Problem: Skin Integrity: Goal: Risk for impaired skin integrity will decrease Outcome: Progressing   Problem: Education: Goal: Knowledge of disease or condition will improve Outcome: Progressing   Problem: Activity: Goal: Risk for activity intolerance will decrease Outcome: Progressing   Problem: Cardiac: Goal: Will achieve and/or maintain hemodynamic stability Outcome: Progressing   Problem: Clinical Measurements: Goal: Postoperative complications will be avoided  or minimized Outcome: Progressing   Problem: Respiratory: Goal: Respiratory status will improve Outcome: Progressing   Problem: Pain Management: Goal: Pain level will decrease Outcome: Progressing   Problem: Skin Integrity: Goal: Wound healing without signs and symptoms infection will improve Outcome: Progressing

## 2018-10-30 NOTE — Progress Notes (Signed)
Patient continually attempting to get out of bed, disregarding her chest tube and monitoring equipment. Repeated instructions have been given with explanation that the patient will hurt herself if she is not careful, but she is disoriented to location and situation.  Patient repeatedly insisting that she needs to go outside to meet with her husband and their friends. States she knows she's at the hospital, but then says she needs to get back to the hospital and go to sleep, or meet her husband to get her truck.   Attempted to call patient's husband to deescalate, on both hospital phone and patient's cell phone. Voicemail reached both times.  This RN sitting in room with patient. Will continue to attempt to reorient and maintain safety precautions

## 2018-10-30 NOTE — Progress Notes (Signed)
     [address]       [place] [address]             [phone]       Confusion overnight again.  Better this am.  Waterseal for 24hrs thus far  Vitals:   10/30/18 0643 10/30/18 0741  BP:    Pulse: (!) 106   Resp: 20   Temp:  98.5 F (36.9 C)  SpO2: 100%    Alert NAD Sinus tach Coarse BS.  No leak on CT  CXR - lung expanded.  Some subQ emphysema  POD 4 s/p R VATS, RULectomy Doing well. Will review meds Will likely remove CT today Arthi Mcdonald O Betti Goodenow

## 2018-10-31 ENCOUNTER — Inpatient Hospital Stay (HOSPITAL_COMMUNITY): Payer: Medicare HMO

## 2018-10-31 LAB — GLUCOSE, CAPILLARY: Glucose-Capillary: 98 mg/dL (ref 70–99)

## 2018-10-31 MED ORDER — KETOROLAC TROMETHAMINE 15 MG/ML IJ SOLN
15.0000 mg | Freq: Four times a day (QID) | INTRAMUSCULAR | Status: AC
  Administered 2018-10-31 (×2): 15 mg via INTRAVENOUS
  Filled 2018-10-31: qty 1

## 2018-10-31 NOTE — Progress Notes (Cosign Needed Addendum)
      [address]       [place] [address]             [phone]       5 Days Post-Op Procedure(s) (LRB): VIDEO BRONCHOSCOPY (N/A) VIDEO ASSISTED THORACOSCOPY (VATS)/WEDGE RESECTION (Right) RIGHT UPPER LOBECTOMY (Right)  Subjective: Patient has been intermittently confused in the evenings. She state "this happened after heart surgery".  She alert and oriented this am. She is frustrate at times because she urinates a lot and she is supposed to ask for assistance but often it takes someone a long time to help her to bathroom. Patient also with complaints of pain at chest tube site and right posterior back/shoulder pain.  Objective: Vital signs in last 24 hours: Temp:  [97.4 F (36.3 C)-99.1 F (37.3 C)] 97.4 F (36.3 C) (09/15 0400) Pulse Rate:  [37-115] 111 (09/15 0400) Cardiac Rhythm: Sinus tachycardia (09/15 0300) Resp:  [20-32] 25 (09/15 0600) BP: (117-148)/(57-81) 137/59 (09/15 0400) SpO2:  [89 %-100 %] 97 % (09/15 0600)     Intake/Output from previous day: 09/14 0701 - 09/15 0700 In: 120 [P.O.:120] Out: 475 [Urine:175; Chest Tube:300]   Physical Exam:  Cardiovascular: Tachycardic Pulmonary: Clear to auscultation on left and slightly diminished right apex Abdomen: Soft, non tender, bowel sounds present. Extremities:Trace LE edema Wounds: Clean and dry.  No erythema or signs of infection. Chest Tube: to water seal, tidling but no air leak  Lab Results: CBC: Recent Labs    10/29/18 0408 10/30/18 0425  WBC 8.2 6.9  HGB 7.5* 7.2*  HCT 23.6* 22.4*  PLT 166 171   BMET:  Recent Labs    10/29/18 1021  K 4.7    PT/INR:  No results for input(s): LABPROT, INR in the last 72 hours. ABG:  INR: Will add last result for INR, ABG once components are confirmed Will add last 4 CBG results once components are confirmed  Assessment/Plan:  1. CV - First degree heart block;ST with HR in the 100's (likely related to increased pain this am).  History of CAD;s/p CABG x 3 2018. On Toprol XL 50 mg daily 2.  Pulmonary - History of COPD. On 2 liters of oxygen via [place]. Chest tube with 300 cc last 24 hours. Chest tube is to water seal and there is tidling but no air leak. No CXR ordered so will order.  Hope to remove chest tube. Encourage incentive spirometer and flutter valve. Mucine for cough.  3. Anemia-H and H yesterday decreased to 7.2 and 22.4.  Conitnue oral ferrous. 4. Will give a few doses of Toradol to help with pain    M ZimmermanPA-C 10/31/2018,7:07 AM [phone]

## 2018-10-31 NOTE — Plan of Care (Signed)

## 2018-11-01 ENCOUNTER — Inpatient Hospital Stay (HOSPITAL_COMMUNITY): Payer: Medicare HMO

## 2018-11-01 MED ORDER — FERROUS SULFATE 325 (65 FE) MG PO TABS: 325.0000 mg | ORAL_TABLET | Freq: Every day | ORAL | 3 refills | Status: DC

## 2018-11-01 MED ORDER — ADULT MULTIVITAMIN W/MINERALS CH: 1.0000 | ORAL_TABLET | Freq: Every day | ORAL | Status: DC

## 2018-11-01 MED ORDER — GUAIFENESIN ER 600 MG PO TB12: 600.0000 mg | ORAL_TABLET | Freq: Two times a day (BID) | ORAL | Status: DC | PRN

## 2018-11-01 MED ORDER — TRAMADOL HCL 50 MG PO TABS: 50.0000 mg | ORAL_TABLET | Freq: Four times a day (QID) | ORAL | 0 refills | Status: DC | PRN

## 2018-11-01 MED ORDER — TORSEMIDE 20 MG PO TABS: 20.0000 mg | ORAL_TABLET | Freq: Every day | ORAL | Status: DC

## 2018-11-01 NOTE — Care Management Important Message (Signed)
Important Message  Patient Details  Name: Emily Rocha MRN: [id number] Date of Birth: 09/29/1955   Medicare Important Message Given:  Yes     Memory Argue 11/01/2018, 3:23 PM

## 2018-11-01 NOTE — TOC Transition Note (Signed)
Transition of Care [facility]) - CM/SW Discharge Note   Patient Details  Name: Emily Rocha MRN: [id number] Date of Birth: 16-Dec-1955  [facility]) CM/SW Contact:  Zenon Mayo, RN Phone Number: 11/01/2018, 12:57 PM   Clinical Narrative:    Patient for dc to home today, she will need home oxygen, NCM offered choice, she chose [employer], referral given to [facility] with [employer], he will bring oxygen to room prior to dc.    Final next level of care: Home/Self Care Barriers to Discharge: No Barriers Identified   Patient Goals and CMS Choice Patient states their goals for this hospitalization and ongoing recovery are:: go home CMS Medicare.gov Compare Post Acute Care list provided to:: Patient Choice offered to / list presented to : Patient  Discharge Placement                       Discharge Plan and Services                DME Arranged: Oxygen DME Agency: [employer] Date DME Agency Contacted: 11/01/18 Time DME Agency Contacted: [id number] Representative spoke with at DME Agency: zack HH Arranged: NA          Social Determinants of Health ([place]) Interventions     Readmission Risk Interventions No flowsheet data found.

## 2018-11-01 NOTE — Progress Notes (Addendum)
      [address]       [place] [address]             [phone]       6 Days Post-Op Procedure(s) (LRB): VIDEO BRONCHOSCOPY (N/A) VIDEO ASSISTED THORACOSCOPY (VATS)/WEDGE RESECTION (Right) RIGHT UPPER LOBECTOMY (Right)  Subjective: Patient had a very good night. She slept well and no confusion.  Objective: Vital signs in last 24 hours: Temp:  [97.5 F (36.4 C)-98.3 F (36.8 C)] 98.2 F (36.8 C) (09/16 0454) Pulse Rate:  [90-110] 104 (09/16 0454) Cardiac Rhythm: Normal sinus rhythm (09/16 0400) Resp:  [18-27] 21 (09/16 0454) BP: (104-146)/(57-74) 146/74 (09/16 0454) SpO2:  [94 %-100 %] 94 % (09/16 0454)     Intake/Output from previous day: 09/15 0701 - 09/16 0700 In: 720 [P.O.:720] Out: 300 [Urine:300]   Physical Exam:  Cardiovascular: Slightly tachycardic Pulmonary: Clear to auscultation on left and slightly diminished right apex Abdomen: Soft, non tender, bowel sounds present. Extremities:SCDs in place Wounds: Clean and dry.  No erythema or signs of infection.   Lab Results: CBC: Recent Labs    10/30/18 0425  WBC 6.9  HGB 7.2*  HCT 22.4*  PLT 171   BMET:  Recent Labs    10/29/18 1021  K 4.7    PT/INR:  No results for input(s): LABPROT, INR in the last 72 hours. ABG:  INR: Will add last result for INR, ABG once components are confirmed Will add last 4 CBG results once components are confirmed  Assessment/Plan:  1. CV - First degree heart block;ST with HR in the low 100's (likely related to increased pain this am). History of CAD;s/p CABG x 3 2018. On Toprol XL 50 mg daily 2.  Pulmonary - History of COPD. On 2 liters of oxygen via [place].  Encourage incentive spirometer and flutter valve. Mucinxe for cough.  3. Anemia-H and H yesterday decreased to 7.2 and 22.4.  Conitnue oral ferrous. 4. Will ask nurse to document oxygen saturation on room air and with ambulation to see if needs home oxygen. If CXR stable, will discharge later  this am   Sharalyn Ink [facility] 11/01/2018,7:24 AM [phone]  Agree with above,  Ambulation Home today

## 2018-11-01 NOTE — Progress Notes (Signed)
SATURATION QUALIFICATIONS: (This note is used to comply with regulatory documentation for home oxygen)  Patient Saturations on Room Air at Rest = 87%  Patient Saturations on Room Air while Ambulating = 76%  Patient Saturations on 3 Liters of oxygen while Ambulating =91%  Please briefly explain why patient needs home oxygen: Pt desats quickly on room air, pt stated she felt dizzy and anxious.

## 2018-11-02 ENCOUNTER — Other Ambulatory Visit: Payer: Self-pay | Admitting: *Deleted

## 2018-11-02 NOTE — Progress Notes (Signed)
The proposed treatment discussed in cancer conference 11/02/18 is for discussion purpose only and is not a binding recommendation.  The patient was not physically examined nor present for their treatment options.  Therefore, final treatment plans cannot be decided.

## 2018-11-02 NOTE — H&P (Signed)
[address]       [place] [address]             [phone]                                                   Emily Rocha [place] Medical Record #[id number] Date of Birth: Feb 26, 1955  Referring: Ivin Poot, MD Primary Care: Glenda Chroman, MD Primary Cardiologist: No primary care provider on file.  Chief Complaint:        Chief Complaint  Patient presents with  . Lung Lesion    Surgical eval,     History of Present Illness:     No events since her clinic appointment  Per my last note:  Emily Rocha 63 y.o. female with a history of an enlarging right upper lobe pulmonary nodule that was PET avid.  Of note, she has had some left lung parenchymal avidity, but she also had a pneumonia at the time of the PET/CT.  She currently is feeling much better.  She continues to have an intermittently productive cough, but denies any dyspnea.      Smoking Hx: Quit smoking in 2018 after her heart surgery.   Zubrod Score: At the time of surgery this patient's most appropriate activity status/level should be described as: [x] ?    0    Normal activity, no symptoms [] ?    1    Restricted in physical strenuous activity but ambulatory, able to do out light work [] ?    2    Ambulatory and capable of self care, unable to do work activities, up and about               >50 % of waking hours                              [] ?    3    Only limited self care, in bed greater than 50% of waking hours [] ?    4    Completely disabled, no self care, confined to bed or chair [] ?    5    Moribund       Past Medical History:  Diagnosis Date  . Asthma   . COPD (chronic obstructive pulmonary disease) ([place])   . Coronary artery disease    hx CABG  . GERD (gastroesophageal reflux disease)   . Pneumonia   . Shortness of breath dyspnea   . Stroke [facility])    "mini stroke" numbness and weakness to left side of the body         Past Surgical History:   Procedure Laterality Date  . AORTIC ARCH ANGIOGRAPHY N/A 11/04/2017   Procedure: AORTIC ARCH ANGIOGRAPHY;  Surgeon: Elam Dutch, MD;  Location: [place] [facility];  Service: Cardiovascular;  Laterality: N/A;  . APPENDECTOMY    . APPLICATION OF WOUND VAC N/A 01/06/2018   Procedure: APPLICATION OF WOUND VAC;  Surgeon: Ivin Poot, MD;  Location: [place];  Service: Thoracic;  Laterality: N/A;  . APPLICATION OF WOUND VAC N/A 01/13/2018   Procedure: WOUND VAC CHANGE;  Surgeon: Ivin Poot, MD;  Location: [place];  Service: Thoracic;  Laterality: N/A;  . CAROTID-SUBCLAVIAN BYPASS GRAFT Right 12/26/2017  Procedure: AORTIC TO RIGHT COMMON CAROTID AND RIGHT SUBCLAVIAN  ARTERY  BYPASS;  Surgeon: Elam Dutch, MD;  Location: [place];  Service: Vascular;  Laterality: Right;  . CHOLECYSTECTOMY    . CLOSURE OF DIAPHRAGM  12/26/2017   Procedure: REPAIR OF DIAPHRAGM;  Surgeon: Ivin Poot, MD;  Location: [place];  Service: Thoracic;;  . COLONOSCOPY N/A 10/03/2014   Procedure: COLONOSCOPY;  Surgeon: Rogene Houston, MD;  [facility];  Service: Endoscopy;  Laterality: N/A;  730  . CORONARY ARTERY BYPASS GRAFT N/A 12/14/2016   Procedure: CORONARY ARTERY BYPASS GRAFTING (CABG) x three , using left internal mammary artery and right leg greater saphenous vein harvested endoscopically;  Surgeon: Ivin Poot, MD;  Location: [place];  Service: Open Heart Surgery;  Laterality: N/A;  . ENDARTERECTOMY Right 12/06/2014   Procedure: ENDARTERECTOMY CAROTid;  Surgeon: Elam Dutch, MD;  [facility];  Service: Vascular;  Laterality: Right;  . ESOPHAGOGASTRODUODENOSCOPY N/A 12/03/2016   Procedure: ESOPHAGOGASTRODUODENOSCOPY (EGD);  Surgeon: Rogene Houston, MD;  [facility];  Service: Endoscopy;  Laterality: N/A;  7:30  . LEFT HEART CATH AND CORONARY ANGIOGRAPHY N/A 12/14/2016   Procedure: LEFT HEART CATH AND CORONARY ANGIOGRAPHY;  Surgeon: Burnell Blanks, MD;  Location: [place] [facility];  Service: Cardiovascular;  Laterality: N/A;  . PERIPHERAL VASCULAR CATHETERIZATION N/A 12/05/2014   Procedure:  Carotid Arch Angiography;  Surgeon: Conrad Cedaredge, MD;  Location: [place] [facility];  Service: Cardiovascular;  Laterality: N/A;  . STERNAL WOUND DEBRIDEMENT N/A 01/06/2018   Procedure: STERNAL WOUND DEBRIDEMENT;  Surgeon: Ivin Poot, MD;  Location: [place];  Service: Thoracic;  Laterality: N/A;  . STERNOTOMY N/A 12/26/2017   Procedure: REDO STERNOTOMY;  Surgeon: Prescott Gum, Collier Salina, MD;  Location: [place];  Service: Thoracic;  Laterality: N/A;  . TEE WITHOUT CARDIOVERSION N/A 12/14/2016   Procedure: TRANSESOPHAGEAL ECHOCARDIOGRAM (TEE);  Surgeon: Prescott Gum, Collier Salina, MD;  Location: [place];  Service: Open Heart Surgery;  Laterality: N/A;         Family History  Problem Relation Age of Onset  . Heart attack Mother   . Cancer Mother   . Diabetes Mother   . Heart disease Mother   . Heart attack Father   . Heart disease Father      Social History       Tobacco Use  Smoking Status Former Smoker  . Packs/day: 2.00  . Years: 40.00  . Pack years: 80.00  Smokeless Tobacco Never Used    Social History       Substance and Sexual Activity  Alcohol Use No  . Alcohol/week: 0.0 standard drinks     No Known Allergies        Current Outpatient Medications  Medication Sig Dispense Refill  . ALPRAZolam (XANAX) 0.5 MG tablet Take 0.5 mg by mouth 3 (three) times daily as needed for anxiety.     Marland Kitchen aspirin 81 MG chewable tablet Chew 81 mg by mouth daily.    Marland Kitchen atorvastatin (LIPITOR) 40 MG tablet Take 1 tablet (40 mg total) daily by mouth. 30 tablet 1  . metoprolol succinate (TOPROL-XL) 50 MG 24 hr tablet Take 50 mg by mouth daily. Take with or immediately following a meal.    . ondansetron (ZOFRAN ODT) 4 MG disintegrating tablet Take 1 tablet (4 mg total) by mouth every 8 (eight) hours as needed for nausea or  vomiting. 20 tablet 0  . pantoprazole (PROTONIX)  40 MG tablet Take 40 mg by mouth daily.    . potassium chloride SA (K-DUR,KLOR-CON) 20 MEQ tablet Take 20 mEq by mouth 2 (two) times daily.   2  . SYMBICORT 160-4.5 MCG/ACT inhaler Inhale 2 puffs into the lungs 2 (two) times daily.   4  . torsemide (DEMADEX) 20 MG tablet Take 2 tablets (40 mg total) by mouth daily. 120 tablet 1  . VENTOLIN HFA 108 (90 BASE) MCG/ACT inhaler Inhale 2 puffs into the lungs every 6 (six) hours as needed for wheezing or shortness of breath.   0   No current facility-administered medications for this visit.     Review of Systems  Constitutional: Negative for chills, fever, malaise/fatigue and weight loss.  HENT: Positive for congestion and hearing loss.   Eyes: Negative.   Respiratory: Positive for cough, sputum production and shortness of breath. Negative for hemoptysis.   Cardiovascular: Negative for chest pain, palpitations and leg swelling.  Gastrointestinal: Negative.   Musculoskeletal: Negative.   Skin: Negative.   Neurological: Negative.   Psychiatric/Behavioral: The patient is nervous/anxious.      PHYSICAL EXAMINATION: Ht 5\' 5"  (1.651 m)   Wt 185 lb (83.9 kg)   BMI 30.79 kg/m  Physical Exam  Constitutional: She is oriented to person, place, and time. She appears well-developed and well-nourished. No distress.  HENT:  Head: Normocephalic and atraumatic.  Eyes: Conjunctivae and EOM are normal.  Neck: Normal range of motion. Neck supple. No tracheal deviation present.    Cardiovascular: Normal rate and regular rhythm.  Murmur heard. Respiratory: Effort normal and breath sounds normal. No respiratory distress. She has no wheezes.    GI: Soft. She exhibits no distension. There is no abdominal tenderness.  Musculoskeletal: Normal range of motion.        General: No edema.  Lymphadenopathy:    She has no cervical adenopathy.  Neurological: She is alert and oriented to person, place,  and time.  Skin: Skin is warm and dry. She is not diaphoretic.  Psychiatric: She has a normal mood and affect.    Diagnostic Studies & Laboratory data:     Recent Radiology Findings:  CT Chest 6/24: Slight, progressive interval enlargement of a lobulated and slightly spiculated nodule of the right pulmonary apex, now measuring 1.9 x 1.4 cm, previously 1.6 x 1.1 cm when measured similarly (series 8, image 28). Moderate underlying centrilobular emphysema. No pleural effusion or pneumothorax. Small, fat containing right-sided Bochdalek's type  PET CT  08/31/18 The solid nodule within the right upper lobe measures 1.9 cm and has an SUV max of 2022.37. On the previous exam this measured 1.1 cm with an SUV max of 10.59. No hypermetabolic right hilar, subcarinal, or right paratracheal adenopathy.  Multifocal areas of ground-glass and airspace consolidation are identified throughout the left upper lobe, new from chest CT dated 08/09/2018. Two small patchy areas of ground-glass and airspace density noted within the left upper lobe. Also new from 08/09/2018.  1 cm left pre-vascular lymph node adjacent to the transverse aortic arch is identified within SUV max of 5.25. On the CT from 08/09/2018 this measured 0.4 cm.  0    I have independently reviewed the above radiology studies  and reviewed the findings with the patient.   Recent Lab Findings: Recent Labs       Lab Results  Component Value Date   WBC 5.0 05/02/2018   HGB 12.0 05/02/2018   HCT 38.3 05/02/2018   PLT 232 05/02/2018  GLUCOSE 93 01/11/2018   CHOL 205 (H) 12/13/2016   TRIG 101 12/13/2016   HDL 68 12/13/2016   LDLCALC 117 (H) 12/13/2016   ALT 26 01/01/2018   AST 20 01/01/2018   NA 134 (L) 01/11/2018   K 3.6 01/11/2018   CL 92 (L) 01/11/2018   CREATININE 0.57 01/11/2018   BUN 6 (L) 01/11/2018   CO2 32 01/11/2018   INR 1.0 05/02/2018   HGBA1C 6.2 (H) 12/22/2017       PFTs: -  FVC: 50% - FEV1: 43% -DLCO: 59%   Problem List: - Right upper lobe pulmonary nodule concerning for primary lung cancer - Hx of CABG, Redo sternotomy, aortic to right common carotid and right subclavian artery bypass - Poor veins - hx of ICU delirium - morbid obesity   Assessment / Plan:   63 yo female with a right upper lobe 1.9cm pulmonary nodule, PET avid, concerning for primary lung cancer.  She has had previous CABG, and a redo sternotomy, with potential exploration on the right pleural space.  We discussed several options for diagnosis, and potential treatment of this lesion.  We have agreed to proceed with a Bronchoscopy, Right VATS, Right upper lobe wedge resection, and possible lobectomy for Sept 10th.  She is at risk for air leaks given the likelihood of adhesions.       Emily Rocha

## 2018-11-03 ENCOUNTER — Other Ambulatory Visit: Payer: Self-pay | Admitting: *Deleted

## 2018-11-03 NOTE — Patient Outreach (Signed)
[facility]) Care Management  11/03/2018  MYKAH BELLOMO 1955-08-03 [id number]    Referral Received 11/03/2018 Initial Outreach 11/03/2018 Primary provider's office to completed transition of care  RN outreached to pt today to inquired on her recent hospital discharge. Pt states she has an O2 tank at home but it's not working correctly. Reports she is very SOB and can't breath. States she call someone from [employer] this morning at 10:00 AM but no call back has been received. RN encouraged pt to go to the ED if this was an emergency however pt declined indicating she just "got out" and did not wish to return. RN encouraged pt not to talk as much, lye down and concentrate on breathing slowly in a relaxed state. Offered to assist and verified the machine is connected correctly however husband in the back ground stating it will get up to 3 liters then the "machine cuts off". RN offered to contacted [facility] spoke with Emi Holes who intervened with an urgent order and will contact the dispatcher to expediting this request. Pt received a call from her primary provider during this call as [facility] call ended to further provide information to the [facility] concerning the pt's issues. [facility] requested additional contact information for the dispatchers who will contact pt directly for details on the pending visit. RN will follow up with pt to make sure she has received the call pending resolution concerning troubleshooting her O2 tank.   RN called pt back 15 minutes after the conversation with no calls received yet. Will continue to follow up later today on pending home visit for O2 troubleshooting. Also informed the pt that [facility] has indicated she is suppose to receive a new O2 set up on 9/21 however pt was not aware but will inquired on the visit today. Pt does not sound as SOB has noted on the initial contact but remains calmer although with limited oxygenation. Will strongly encouraged pt to visit to  ED if symptoms get worse while awaiting [facility] to visit.   Addendum:Pending [facility] visit for O2 troubleshooting- Pt returned a call to RN case manager indicating [facility] came and set up her O2. States they were very nice. Pt express how appreciative she was for RN calling her today and assisting with this issues. Pt was so grateful that she indicated she was going to put RN case manager's contact number in her phone. Pt without distressful breathing better and very pleased for the assistance today. Based upon pt's issues today as she continues to recover RN will offer to follow up next week with actual enrollment into the [facility] program and services if pt remains interested and receptive to all discussed. Pt very receptive as she once again expressed her gratitude for rendered services today from this RN case manager.  Raina Mina, RN Care Management Coordinator [place] Office [phone]

## 2018-11-06 DIAGNOSIS — C3491 Malignant neoplasm of unspecified part of right bronchus or lung: Secondary | ICD-10-CM | POA: Diagnosis not present

## 2018-11-06 DIAGNOSIS — J439 Emphysema, unspecified: Secondary | ICD-10-CM | POA: Diagnosis not present

## 2018-11-06 DIAGNOSIS — I4891 Unspecified atrial fibrillation: Secondary | ICD-10-CM | POA: Diagnosis not present

## 2018-11-06 DIAGNOSIS — Z6832 Body mass index (BMI) 32.0-32.9, adult: Secondary | ICD-10-CM | POA: Diagnosis not present

## 2018-11-06 DIAGNOSIS — Z299 Encounter for prophylactic measures, unspecified: Secondary | ICD-10-CM | POA: Diagnosis not present

## 2018-11-06 DIAGNOSIS — Z09 Encounter for follow-up examination after completed treatment for conditions other than malignant neoplasm: Secondary | ICD-10-CM | POA: Diagnosis not present

## 2018-11-07 ENCOUNTER — Encounter: Payer: Self-pay | Admitting: *Deleted

## 2018-11-07 ENCOUNTER — Other Ambulatory Visit: Payer: Self-pay | Admitting: *Deleted

## 2018-11-07 NOTE — Patient Outreach (Signed)
[facility]) Care Management  11/07/2018  Emily Rocha [birth year] [id number]    Telephone Assessment-COPD  NOTE: RN trouble-shooted a situation with pt on last Friday with respiratory distress with a non working home O2 system in the home. RN able to get the involved agency [facility] to visit and troubleshoot and resolve this issue  RN spoke with pt concerning [facility] services and available programs. Pt receptive and open for this conversation today. Pt provide information concerning her ongoing medical issues. Pt mentioned several medical issues related to lobectomy of her RUL due to diagnosis of cancer, HF and "mini strokes" mentioned. Pt states she would like to learn more about her COPD and how to manage this better due to her breathing issues. RN educated pt on the COPD action zone as pt with limited knowledge concerning this condition. Discussed when to contact her provider with YELLOW zone symptoms and what is considered acute signs for visiting the ED. Verified today pt remains in the GREEN zone with only some SOB upon exertions when ambulating short distances. Pt states the [place] agency came out yesterday and change her old tank to a new tank with setting on 4 liters. Pt forgot how many liters she is suppose to be on and reduce it to 3 liters. States upon ambulating to the bathroom she was more SOB on the 3 liters then the 4 liters. RN encouraged the pt or her spouse to contact Dr. Prescott Gum who is following her for COPD to request intervention and verify pt's O2 liters for her home concentrator. Pt has indicated she would call as RN provided the contact number.   Discussed and generated a plan of care with some of the initial assessment completed. COPD with adherence with medications and symptoms management. Pt agrees with the plan and receptive to printed EMMI educational material to be sent to pt's home address. Pt aware she will be sent this information along with  communication with her provider concerning her disposition with [facility] services.   Pt requested a call back next week to finish the initial assessment via [facility]. RN will schedule appointment and follow up next week to completed the assessment.   [facility] CM Care Plan Problem One     Most Recent Value  Care Plan Problem One  Deficient Knowledge realted to COPD exacerbation complications and symptoms management  Role Documenting the Problem One  Care Management Coordinator  Care Plan for Problem One  Active  [facility] Long Term Goal   Pt will verbalize two symptoms of COPD exacerbation within the next 90 days.  [facility] Long Term Goal Start Date  11/07/18  Interventions for Problem One Long Term Goal  Will discuss the COPD action plan and verify pt remains in the GREEN zone with no acute issues. Will  [facility] CM Short Term Goal #1   Pt will keep all medical appointments post op discharge as schedule over the next 30 days.  [facility] CM Short Term Goal #1 Start Date  11/07/18  Interventions for Short Term Goal #1  WIll verify all medical appointments and sufficient transporation if needed. Will strongly encouraged pt to complete all virtual and office visits as scheduled.  [facility] CM Short Term Goal #2   Pt will verbalize the plan of action in the GREEN zone within the next 30 days.  [facility] CM Short Term Goal #2 Start Date  11/07/18  Interventions for Short Term Goal #2  Will educate on the GREEN zone and send  printed information on this medical condition and what to do if acute issues should occur.      Raina Mina, RN Care Management Coordinator [place] Office [phone]

## 2018-11-08 ENCOUNTER — Other Ambulatory Visit: Payer: Self-pay | Admitting: Thoracic Surgery (Cardiothoracic Vascular Surgery)

## 2018-11-08 DIAGNOSIS — Z902 Acquired absence of lung [part of]: Secondary | ICD-10-CM

## 2018-11-09 DIAGNOSIS — Z299 Encounter for prophylactic measures, unspecified: Secondary | ICD-10-CM | POA: Diagnosis not present

## 2018-11-09 DIAGNOSIS — C3491 Malignant neoplasm of unspecified part of right bronchus or lung: Secondary | ICD-10-CM | POA: Diagnosis not present

## 2018-11-09 DIAGNOSIS — J439 Emphysema, unspecified: Secondary | ICD-10-CM | POA: Diagnosis not present

## 2018-11-09 DIAGNOSIS — F419 Anxiety disorder, unspecified: Secondary | ICD-10-CM | POA: Diagnosis not present

## 2018-11-09 DIAGNOSIS — I4891 Unspecified atrial fibrillation: Secondary | ICD-10-CM | POA: Diagnosis not present

## 2018-11-09 DIAGNOSIS — Z6832 Body mass index (BMI) 32.0-32.9, adult: Secondary | ICD-10-CM | POA: Diagnosis not present

## 2018-11-10 ENCOUNTER — Encounter: Payer: Self-pay | Admitting: Thoracic Surgery (Cardiothoracic Vascular Surgery)

## 2018-11-10 ENCOUNTER — Ambulatory Visit (INDEPENDENT_AMBULATORY_CARE_PROVIDER_SITE_OTHER): Payer: Self-pay | Admitting: Thoracic Surgery (Cardiothoracic Vascular Surgery)

## 2018-11-10 ENCOUNTER — Other Ambulatory Visit: Payer: Self-pay

## 2018-11-10 ENCOUNTER — Ambulatory Visit
Admission: RE | Admit: 2018-11-10 | Discharge: 2018-11-10 | Disposition: A | Discharge status: Home / Self Care | Payer: Medicare HMO | Source: Ambulatory Visit | Attending: Thoracic Surgery (Cardiothoracic Vascular Surgery) | Admitting: Thoracic Surgery (Cardiothoracic Vascular Surgery)

## 2018-11-10 VITALS — BP 114/71 | HR 110 | Temp 97.5°F | Resp 28 | Ht 65.0 in | Wt 183.0 lb

## 2018-11-10 DIAGNOSIS — R911 Solitary pulmonary nodule: Secondary | ICD-10-CM

## 2018-11-10 DIAGNOSIS — Z902 Acquired absence of lung [part of]: Secondary | ICD-10-CM

## 2018-11-10 DIAGNOSIS — R0602 Shortness of breath: Secondary | ICD-10-CM | POA: Diagnosis not present

## 2018-11-10 MED ORDER — OXYCODONE-ACETAMINOPHEN 5-325 MG PO TABS: 1.0000 | ORAL_TABLET | Freq: Four times a day (QID) | ORAL | 0 refills | Status: DC | PRN

## 2018-11-10 NOTE — Progress Notes (Signed)
      Emily Rocha [address]       Emily Rocha,Emily Rocha [id number]             [id number]        Emily Rocha [place] Medical Record #[id number] Date of Birth: 1955/10/21  Referring: Emily Poot, MD Primary Care: Emily Chroman, MD Primary Cardiologist:No primary care provider on file.  Reason for visit:   follow-up  History of Present Illness:     Emily Rocha presents for her fist follow-up after right VATS, right upper lobe.  She was discharged on home oxygen, but had some issues with the delivery company.  She is quite deconditioned at baseline, but feels better today.  She forgot her Oxygen in her car, so has been short of breath today.  She denies any fevers, or coughs.  She does feel cold today in clinic  Physical Exam: BP 114/71 (BP Location: Right Arm, Patient Position: Sitting, Cuff Size: Normal)   Pulse (!) 110   Temp (!) 97.5 F (36.4 C)   Resp (!) 28   Ht 5\' 5"  (1.651 m)   Wt 183 lb (83 kg)   SpO2 (!) 87% Comment: ON 3L O2 X 3 MINUTES  BMI 30.45 kg/m   Alert NAD RRR, no murmur.  Incision clean.  Increased work of breathing. Abdomen soft, NT/ND no peripheral edema   Diagnostic Studies & Laboratory data: CXR: Postoperative change on the right. Area concerning for potential pneumonia right lower lobe with small right pleural effusion. Areas of scarring bilaterally.  Path: 1. Lung, wedge biopsy/resection, Right upper lobe - MODERATELY TO POORLY DIFFERENTIATED SQUAMOUS CELL CARCINOMA, 2.6 CM - CARCINOMA ABUTS VISCERAL PLEURA BUT DOES NOT INVADE INTO IT - RESECTION MARGINS ARE NEGATIVE FOR CARCINOMA - NEGATIVE FOR LYMPHOVASCULAR OR PERINEURAL INVASION - SEE ONCOLOGY TABLE 2. Lymph node, biopsy, Level 11 - LYMPH NODE, NEGATIVE FOR CARCINOMA (0/1) 3. Lung, resection (segmental or lobe), Right upper lobe - LUNG PARENCHYMA AND BRONCHIAL MARGIN, NEGATIVE FOR CARCINOMA 4. Lymph node, biopsy, Level 7 - BENIGN FIBROADIPOSE TISSUE - NEGATIVE FOR CARCINOMA -  LYMPHOID TISSUE IS NOT IDENTIFIED    Assessment / Plan:   pT1c, pN0, MO, squamous cell carcinoma of the lung.  S/p RULectomy.  Case was discussed in tumor board, will continue surveillance.   Rx for percocet given Will f/u in 1 month   Emily Rocha 11/10/2018 1:34 PM

## 2018-11-14 ENCOUNTER — Other Ambulatory Visit: Payer: Self-pay | Admitting: *Deleted

## 2018-11-14 NOTE — Patient Outreach (Signed)
[facility]) Care Management  11/14/2018  Emily Rocha 09-Sep-1955 [id number]    Telephone Assessment-  RN attempted to contact pt today to complete information from the initial assessment however pt not available. RN able to leave a HIPAA approved voice message requesting a call back.   Will follow up accordingly with another outreach call within the next 4 days.  Raina Mina, RN Care Management Coordinator [place] Office [phone]

## 2018-11-20 ENCOUNTER — Ambulatory Visit: Payer: Self-pay | Admitting: *Deleted

## 2018-11-20 ENCOUNTER — Other Ambulatory Visit: Payer: Self-pay

## 2018-11-20 NOTE — Patient Outreach (Signed)
[place] [facility]) Care Management  [place]  11/20/2018   Emily Rocha [birth year] [id number]   Telephone Assessment    Outreach attempt #1 to patient. Spoke with patient who voices she is doing well. She reports that she is trying to "do her part" to manage her breathing. She is pleased to share with RN CM that she has been using her incentive spirometry about 10/day. She voices that she uses it "eevrytime" she thinks about it. Patient wearing oxygen 24/7 at 3.5L/min via [place]. She reports mild SOB with exertion at times. She is using inhaler as directed. Patient states she is coughing up clear colored phlegm. RN CM educated pt. on s/s of worsening condition and when to seek medical attention. She reports she has upcoming appts but confirmed that she has transportation to get to appts. She denies any RN CM needs or concerns at this time. Patient aware that she can call if any needs or concerns arise.    Encounter Medications:  Outpatient Encounter Medications as of 11/20/2018  Medication Sig  . ALPRAZolam (XANAX) 0.5 MG tablet Take 0.5 mg by mouth 3 (three) times daily.   Marland Kitchen aspirin 81 MG chewable tablet Chew 81 mg by mouth daily.  Marland Kitchen atorvastatin (LIPITOR) 40 MG tablet Take 1 tablet (40 mg total) daily by mouth.  . diphenhydramine-acetaminophen (TYLENOL PM) 25-500 MG TABS tablet Take 2-3 tablets by mouth at bedtime.  . ferrous sulfate 325 (65 FE) MG tablet Take 1 tablet (325 mg total) by mouth daily with breakfast. For one month. If develop constipation, may stop sooner  . guaiFENesin (MUCINEX) 600 MG 12 hr tablet Take 1 tablet (600 mg total) by mouth 2 (two) times daily as needed for cough or to loosen phlegm.  . metoprolol succinate (TOPROL-XL) 50 MG 24 hr tablet Take 50 mg by mouth daily. Take with or immediately following a meal.  . [START ON 12/01/2018] Multiple Vitamin (MULTIVITAMIN WITH MINERALS) TABS tablet Take 1 tablet by mouth daily.  . mupirocin ointment  (BACTROBAN) 2 % Place 1 application into the nose 2 (two) times daily.  Marland Kitchen oxyCODONE-acetaminophen (PERCOCET/ROXICET) 5-325 MG tablet Take 1-2 tablets by mouth every 6 (six) hours as needed for severe pain.  . pantoprazole (PROTONIX) 40 MG tablet Take 40 mg by mouth daily.  . potassium chloride SA (K-DUR,KLOR-CON) 20 MEQ tablet Take 20 mEq by mouth daily.   . SYMBICORT 160-4.5 MCG/ACT inhaler Inhale 2 puffs into the lungs 2 (two) times daily.   Marland Kitchen torsemide (DEMADEX) 20 MG tablet Take 1 tablet (20 mg total) by mouth daily.  . traMADol (ULTRAM) 50 MG tablet Take 1 tablet (50 mg total) by mouth every 6 (six) hours as needed (mild pain).  . VENTOLIN HFA 108 (90 BASE) MCG/ACT inhaler Inhale 2 puffs into the lungs every 6 (six) hours as needed for wheezing or shortness of breath.    No facility-administered encounter medications on file as of 11/20/2018.     Functional Status:  In your present state of health, do you have any difficulty performing the following activities: 11/07/2018 10/27/2018  Hearing? N -  [place]? N -  Difficulty concentrating or making decisions? N -  Walking or climbing stairs? N -  Dressing or bathing? N -  Doing errands, shopping? N N  Preparing Food and eating ? N -  Using the Toilet? N -  In the past six months, have you accidently leaked urine? N -  Do you  have problems with loss of bowel control? N -  Managing your Medications? N -  Managing your Finances? N -  Housekeeping or managing your Housekeeping? Y -  Comment Seeks assistance from her spouse -  Some recent data might be hidden    Fall/Depression Screening: Fall Risk  11/07/2018 01/31/2017  Falls in the past year? 0 No  Risk for fall due to : - (No Data)  Risk for fall due to: Comment - No history of falls   PHQ 2/9 Scores 11/07/2018 01/31/2017  PHQ - 2 Score 0 0  PHQ- 9 Score - 2  Exception Documentation - (No Data)    Assessment: THN CM Care Plan Problem One     Most Recent Value   Care Plan Problem One  Deficient Knowledge realted to COPD exacerbation complications and symptoms management  Role Documenting the Problem One  Care Management Coordinator  Care Plan for Problem One  Active  THN Long Term Goal   Pt will verbalize two symptoms of COPD exacerbation within the next 90 days.  THN Long Term Goal Start Date  11/07/18  Interventions for Problem One Long Term Goal  RN CM discussed COPD mgmt alogn with patient. RN CM educated pt. on s/s of worsening condition and when to seek medical attention  THN CM Short Term Goal #1   Pt will keep all medical appointments post op discharge as schedule over the next 30 days.  THN CM Short Term Goal #1 Start Date  11/07/18  Interventions for Short Term Goal #1  RN CM confirmed with pt. appt schedule. RN CM assessed for adequate and reliable transportation.  THN CM Short Term Goal #2   Pt will verbalize the plan of action in the GREEN zone within the next 30 days.  THN CM Short Term Goal #2 Start Date  11/07/18  Interventions for Short Term Goal #2  RN CM edcuated pt. on Green Zone indicators and ways to stay in the green zone.       Plan:  RN CM will follow up with patient within one month. RN CM will route encounter to PCP.  Enzo [place], RN,BSN,CCM [place] Management Telephonic Care Management Coordinator Direct Phone: [phone] Toll Free: [phone] Fax: [phone]

## 2018-11-27 ENCOUNTER — Encounter: Payer: Self-pay | Admitting: *Deleted

## 2018-11-27 DIAGNOSIS — C3411 Malignant neoplasm of upper lobe, right bronchus or lung: Secondary | ICD-10-CM | POA: Insufficient documentation

## 2018-12-01 DIAGNOSIS — J449 Chronic obstructive pulmonary disease, unspecified: Secondary | ICD-10-CM | POA: Diagnosis not present

## 2018-12-11 ENCOUNTER — Ambulatory Visit: Payer: Self-pay

## 2018-12-15 ENCOUNTER — Encounter: Payer: Self-pay | Admitting: Thoracic Surgery (Cardiothoracic Vascular Surgery)

## 2018-12-15 ENCOUNTER — Ambulatory Visit (INDEPENDENT_AMBULATORY_CARE_PROVIDER_SITE_OTHER): Payer: Self-pay | Admitting: Thoracic Surgery (Cardiothoracic Vascular Surgery)

## 2018-12-15 ENCOUNTER — Other Ambulatory Visit: Payer: Self-pay

## 2018-12-15 VITALS — BP 134/79 | HR 96 | Temp 98.1°F | Resp 20 | Ht 65.0 in | Wt 183.0 lb

## 2018-12-15 DIAGNOSIS — Z902 Acquired absence of lung [part of]: Secondary | ICD-10-CM

## 2018-12-15 DIAGNOSIS — Z85118 Personal history of other malignant neoplasm of bronchus and lung: Secondary | ICD-10-CM

## 2018-12-15 DIAGNOSIS — R911 Solitary pulmonary nodule: Secondary | ICD-10-CM

## 2018-12-15 NOTE — Progress Notes (Signed)
      [address]       [place] [address]             [id number]        Jadin H Ditton Allison Medical Record #[id number] Date of Birth: 04-14-1955  Referring: Ivin Poot, MD Primary Care: Glenda Chroman, MD Primary Cardiologist:No primary care provider on file.  Reason for visit:   follow-up  History of Present Illness:     Mrs. Pharris comes in for her second follow-up appointment after undergoing right VATS right upper lobectomy.  She is doing well.  She no longer requires any oxygen and her sats remained above 91% with ambulation.  Physical Exam: BP 134/79   Pulse 96   Temp 98.1 F (36.7 C)   Resp 20   Ht 5\' 5"  (1.651 m)   Wt 183 lb (83 kg)   SpO2 91% Comment: RA  BMI 30.45 kg/m   Alert NAD Regular, no murmur.  Incision clean, well-healed.   Coarse breath sounds wet cough. Abdomen soft, NT/ND No peripheral edema   Diagnostic Studies & Laboratory data:     Assessment / Plan:   pT1c, pN0, MO, squamous cell carcinoma of the lung.  S/p RULectomy.  Case was discussed in tumor board, will continue surveillance.   Will follow up in 6 months with chest x-ray.   Alencia Gordon O Lyndell Allaire 12/15/2018 11:00 AM

## 2018-12-18 ENCOUNTER — Other Ambulatory Visit: Payer: Self-pay

## 2018-12-18 NOTE — Patient Outreach (Signed)
[place] [facility]) Care Management  12/18/2018  Emily Rocha 08-Feb-1956 [id number]   Telephone Assessment  Outreach attempt #1 to patient.Spoke with patient who denies any acute issues or concerns at present. She is pleased to report that she has been breathing without having to use oxygen for over a week. She shares that she went tot see lung MD and he was so pleased at her progress. She was advised that she did not need to wear oxygen any longer. Patient states she is going to call DME supplier and have them come pick up oxygen. Discussed with patient if she felt like she should keep it there for prn usage and in the case of if SOB represents itself. Patient reports she talked with MD and was told it was okay for her to send the oxygen back ans she plans to do so. She voices that she has been "healed" and she feels like a new person. She has energy and has been able to do things around the house that she hadn't been able to do for a long time. She denies any RN CM needs or concerns at this time. She is aware to call if any issues or concerns arise.   Plan: RN CM will make outreach attempt to patient next month.   Enzo Montgomery, RN,BSN,CCM [place] Management Telephonic Care Management Coordinator Direct Phone: [phone] Toll Free: [phone] Fax: [phone]

## 2019-01-01 DIAGNOSIS — J449 Chronic obstructive pulmonary disease, unspecified: Secondary | ICD-10-CM | POA: Diagnosis not present

## 2019-01-01 DIAGNOSIS — Z6832 Body mass index (BMI) 32.0-32.9, adult: Secondary | ICD-10-CM | POA: Diagnosis not present

## 2019-01-01 DIAGNOSIS — J439 Emphysema, unspecified: Secondary | ICD-10-CM | POA: Diagnosis not present

## 2019-01-01 DIAGNOSIS — Z87891 Personal history of nicotine dependence: Secondary | ICD-10-CM | POA: Diagnosis not present

## 2019-01-01 DIAGNOSIS — Z713 Dietary counseling and surveillance: Secondary | ICD-10-CM | POA: Diagnosis not present

## 2019-01-01 DIAGNOSIS — Z299 Encounter for prophylactic measures, unspecified: Secondary | ICD-10-CM | POA: Diagnosis not present

## 2019-01-03 ENCOUNTER — Other Ambulatory Visit: Payer: Self-pay | Admitting: Internal Medicine

## 2019-01-03 DIAGNOSIS — Z20822 Contact with and (suspected) exposure to covid-19: Secondary | ICD-10-CM

## 2019-01-05 LAB — NOVEL CORONAVIRUS, NAA: SARS-CoV-2, NAA: NOT DETECTED

## 2019-01-16 ENCOUNTER — Other Ambulatory Visit: Payer: Self-pay

## 2019-01-16 NOTE — Patient Outreach (Signed)
[place] [facility]) Care Management  01/16/2019  Emily Rocha 1955-03-01 [id number]   Telephone Assessment   Outreach attempt #1 to patient. Spoke with patient. She denies any acute issues or concerns at present. She is pleased to report how well she has recovered and made progress.Patient voices that she is getting stronger and better everyday. She denies any SOB, She voices that she has not had to use any oxygen or had any SOB episodes in over a month. She had COVID-19 testing just to make sure she was not positive and results were negative a few weeks ago. Patient voices adhering to safety guidelines given she is in high risk category. She voices no recent changes in meds. She has no upcoming appts until after the new year. She has supportive spouse in the home who is able to assist her as needed. She denies any RN CM needs or concerns at this time.   THN CM Care Plan Problem One     Most Recent Value  Care Plan Problem One  Deficient Knowledge realted to COPD exacerbation complications and symptoms management  Role Documenting the Problem One  Care Management Coordinator  Care Plan for Problem One  Active  THN Long Term Goal   Pt will verbalize two symptoms of COPD exacerbation within the next 90 days.  THN Long Term Goal Start Date  11/07/18  THN Long Term Goal Met Date  01/16/19  THN CM Short Term Goal #1   Pt will keep all medical appointments post op discharge as schedule over the next 30 days.  THN CM Short Term Goal #1 Start Date  11/07/18  THN CM Short Term Goal #1 Met Date  01/16/19  THN CM Short Term Goal #2   Pt will verbalize the plan of action in the GREEN zone within the next 30 days.  THN CM Short Term Goal #2 Start Date  11/07/18  [facility] CM Short Term Goal #2 Met Date  01/16/19  THN CM Short Term Goal #3  Patient will report no SOB episodes over the next 30 days.  THN CM Short Term Goal #3 Start Date  01/16/19  Interventions for Short Tern Goal #3  RN CM assessed  for SOB and resp issues. RN CM reviewed with pt. ways to manage and treat sxs.        Plan: RN CM will discussed with patient next outreach within the month of Feburary. Patient gave verbal consent and in agreement with RN CM follow up timeframe. Patient aware that they may contact RN CM sooner for any issues or concerns. RN CM will transition patient from CM to DM mgmt program as no care coordination needs noted.  RN CM will send quarterly update to PCP.   Enzo [place], RN,BSN,CCM [place] Management Telephonic Care Management Coordinator Direct Phone: [phone] Toll Free: [phone] Fax: [phone]

## 2019-01-31 DIAGNOSIS — J449 Chronic obstructive pulmonary disease, unspecified: Secondary | ICD-10-CM | POA: Diagnosis not present

## 2019-02-06 DIAGNOSIS — I4891 Unspecified atrial fibrillation: Secondary | ICD-10-CM | POA: Diagnosis not present

## 2019-02-06 DIAGNOSIS — I2510 Atherosclerotic heart disease of native coronary artery without angina pectoris: Secondary | ICD-10-CM | POA: Diagnosis not present

## 2019-02-06 DIAGNOSIS — J439 Emphysema, unspecified: Secondary | ICD-10-CM | POA: Diagnosis not present

## 2019-02-06 DIAGNOSIS — F419 Anxiety disorder, unspecified: Secondary | ICD-10-CM | POA: Diagnosis not present

## 2019-02-06 DIAGNOSIS — Z6832 Body mass index (BMI) 32.0-32.9, adult: Secondary | ICD-10-CM | POA: Diagnosis not present

## 2019-02-06 DIAGNOSIS — Z299 Encounter for prophylactic measures, unspecified: Secondary | ICD-10-CM | POA: Diagnosis not present

## 2019-02-26 ENCOUNTER — Telehealth: Payer: Self-pay

## 2019-02-26 NOTE — Telephone Encounter (Signed)
Pt called office to report three episodes of L chest pain since November 2020, the last incident being yesterday. She states the pain felt as if she were being "punched by a fist." She currently denies pain and reports that she has not been short of breath, nor has she had any persistent chest pressure, nausea, or L arm pain. Informed her to promptly call her PCP or cardiologist, and advised to seek immediate medical care for chest pressure/pain, shortness of breath, nausea, and/or other s/s of MI. She plans to notify her PCP now.

## 2019-02-27 DIAGNOSIS — Z6832 Body mass index (BMI) 32.0-32.9, adult: Secondary | ICD-10-CM | POA: Diagnosis not present

## 2019-02-27 DIAGNOSIS — J439 Emphysema, unspecified: Secondary | ICD-10-CM | POA: Diagnosis not present

## 2019-02-27 DIAGNOSIS — R918 Other nonspecific abnormal finding of lung field: Secondary | ICD-10-CM | POA: Diagnosis not present

## 2019-02-27 DIAGNOSIS — I700 Atherosclerosis of aorta: Secondary | ICD-10-CM | POA: Diagnosis not present

## 2019-02-27 DIAGNOSIS — Z87891 Personal history of nicotine dependence: Secondary | ICD-10-CM | POA: Diagnosis not present

## 2019-02-27 DIAGNOSIS — R0600 Dyspnea, unspecified: Secondary | ICD-10-CM | POA: Diagnosis not present

## 2019-02-27 DIAGNOSIS — I2510 Atherosclerotic heart disease of native coronary artery without angina pectoris: Secondary | ICD-10-CM | POA: Diagnosis not present

## 2019-02-27 DIAGNOSIS — J90 Pleural effusion, not elsewhere classified: Secondary | ICD-10-CM | POA: Diagnosis not present

## 2019-02-27 DIAGNOSIS — Z9889 Other specified postprocedural states: Secondary | ICD-10-CM | POA: Diagnosis not present

## 2019-02-27 DIAGNOSIS — R0789 Other chest pain: Secondary | ICD-10-CM | POA: Diagnosis not present

## 2019-02-27 DIAGNOSIS — Z299 Encounter for prophylactic measures, unspecified: Secondary | ICD-10-CM | POA: Diagnosis not present

## 2019-02-27 DIAGNOSIS — R079 Chest pain, unspecified: Secondary | ICD-10-CM | POA: Diagnosis not present

## 2019-02-27 DIAGNOSIS — K449 Diaphragmatic hernia without obstruction or gangrene: Secondary | ICD-10-CM | POA: Diagnosis not present

## 2019-02-27 DIAGNOSIS — R5383 Other fatigue: Secondary | ICD-10-CM | POA: Diagnosis not present

## 2019-03-03 DIAGNOSIS — J449 Chronic obstructive pulmonary disease, unspecified: Secondary | ICD-10-CM | POA: Diagnosis not present

## 2019-03-05 DIAGNOSIS — E2839 Other primary ovarian failure: Secondary | ICD-10-CM | POA: Diagnosis not present

## 2019-03-07 DIAGNOSIS — Z6832 Body mass index (BMI) 32.0-32.9, adult: Secondary | ICD-10-CM | POA: Diagnosis not present

## 2019-03-07 DIAGNOSIS — J439 Emphysema, unspecified: Secondary | ICD-10-CM | POA: Diagnosis not present

## 2019-03-07 DIAGNOSIS — Z299 Encounter for prophylactic measures, unspecified: Secondary | ICD-10-CM | POA: Diagnosis not present

## 2019-03-07 DIAGNOSIS — F419 Anxiety disorder, unspecified: Secondary | ICD-10-CM | POA: Diagnosis not present

## 2019-03-07 DIAGNOSIS — Z713 Dietary counseling and surveillance: Secondary | ICD-10-CM | POA: Diagnosis not present

## 2019-03-19 ENCOUNTER — Other Ambulatory Visit: Payer: Self-pay

## 2019-03-19 DIAGNOSIS — F419 Anxiety disorder, unspecified: Secondary | ICD-10-CM | POA: Diagnosis not present

## 2019-03-19 DIAGNOSIS — Z6832 Body mass index (BMI) 32.0-32.9, adult: Secondary | ICD-10-CM | POA: Diagnosis not present

## 2019-03-19 DIAGNOSIS — C3491 Malignant neoplasm of unspecified part of right bronchus or lung: Secondary | ICD-10-CM | POA: Diagnosis not present

## 2019-03-19 DIAGNOSIS — J439 Emphysema, unspecified: Secondary | ICD-10-CM | POA: Diagnosis not present

## 2019-03-19 DIAGNOSIS — Z299 Encounter for prophylactic measures, unspecified: Secondary | ICD-10-CM | POA: Diagnosis not present

## 2019-03-19 DIAGNOSIS — H6691 Otitis media, unspecified, right ear: Secondary | ICD-10-CM | POA: Diagnosis not present

## 2019-03-19 NOTE — Patient Outreach (Signed)
[place] [facility]) Care Management  03/19/2019  Emily Rocha 11-19-1955 [id number]   Telephone Assessment   Outreach attempt #1 to patient. Spoke briefly with patient. She reports that she is getting ready as she has an MD appt today. She reports that she thinks she has an "abscess or something in her right ear" that has been bothering her. She also shares that she is back working as a Freight forwarder at [employer] and this is a busy time for her. She reports that she when she saw MD last month it was mentioned about her possibly seeing breathing specialist. Patient does not really want to do that it this time. She feels like her breathing is managed. She voices that she uses incentive spirometry multiple times per day. She confirms that she had her oxygen picked up in Dec. As she was no longer using it. She denies any issues or concerns at this time.   THN CM Care Plan Problem One     Most Recent Value  Care Plan Problem One  Deficient Knowledge realted to COPD exacerbation complications and symptoms management  Role Documenting the Problem One  Care Management Coordinator  Care Plan for Problem One  Active  THN Long Term Goal   Patient will have no hospitalizations over the next 60 days.  THN Long Term Goal Start Date  03/19/19  Interventions for Problem One Long Term Goal  RN CM assessed for any acute isues/sxs. RN CM reviewed action plan with pt. RN CM confirmed pt. knows when,how and why to seek medical attention for changes in condition.   THN CM Short Term Goal #3  Patient will report no SOB episodes over the next 30 days.  THN CM Short Term Goal #3 Start Date  01/16/19  [facility] CM Short Term Goal #3 Met Date  03/19/19      Plan: RN CM discussed with patient next outreach within the month of April. Patient gave verbal consent and in agreement with RN CM follow up and timeframe. Patient aware that they may contact RN CM sooner for any issues or concerns.  Enzo Montgomery,  RN,BSN,CCM [place] Management Telephonic Care Management Coordinator Direct Phone: [phone] Toll Free: [phone] Fax: [phone]

## 2019-03-21 ENCOUNTER — Encounter: Payer: Self-pay | Admitting: Cardiology

## 2019-03-21 ENCOUNTER — Ambulatory Visit (INDEPENDENT_AMBULATORY_CARE_PROVIDER_SITE_OTHER): Payer: Medicare HMO | Admitting: Cardiology

## 2019-03-21 VITALS — BP 136/73 | HR 88 | Temp 97.5°F | Ht 65.0 in | Wt 184.0 lb

## 2019-03-21 DIAGNOSIS — I350 Nonrheumatic aortic (valve) stenosis: Secondary | ICD-10-CM

## 2019-03-21 DIAGNOSIS — I6523 Occlusion and stenosis of bilateral carotid arteries: Secondary | ICD-10-CM

## 2019-03-21 DIAGNOSIS — I2510 Atherosclerotic heart disease of native coronary artery without angina pectoris: Secondary | ICD-10-CM | POA: Diagnosis not present

## 2019-03-21 MED ORDER — ISOSORBIDE MONONITRATE ER 30 MG PO TB24: 15.0000 mg | ORAL_TABLET | Freq: Every day | ORAL | 3 refills | Status: DC

## 2019-03-21 NOTE — Progress Notes (Signed)
Clinical Summary Emily Rocha is a 64 y.o.female  seen today for follow up of the following medical problems.   1. CAD - s/p CABG after presenting 11/2016 with NSTEMI. Presentation complicated by cardiogenic shock - echo 12/2016 LVEF 45-50% - developed wound infection followed by CT surgery that has now healed.  - currentlty in cardiac rehab - plavix stopped previously by CT surgery   11/2017 echo LVEF normal LVEF, grade I DDX, mild AS, mild AI,  10/2018 nuclear stress no ischemia   Recent chest pain episodes - severe episodes of chest in Nov - again in January. Tightness left side, 8/10 in severity. No other associated symptoms. Lasted about 2 minutes. Repeat episode just a few minutes later.  - +SOB since her lung surgery. Over the last 3 weeks progressing. Some wheezing.    - seen by pcp Jan 2021 for chest pain - Jan 2021 CT PE [facility]: no PE - BNP 612 DDimer 0.72    2. Post op afib - post op afib after CABG, converted to NSR on amio - amio has since been discontinued.  - from notes has not tolerated DOAC in the past.    - No evidence of recurrence of afib.  - no recent palpitations  3. Carotid stenosis - history of prior symptomatic carotid stenosis with TIAs. Now s/p CEA - followed by vascular, - no recent symptoms  4. Aortic stenosis -mild AS by last echo - no symptoms  5. COPD - followed by pulmonary - on home O2  6. Lung cancer -s/p lung wedge resection - compleltely removed    Past Medical History:  Diagnosis Date  . Anxiety   . Asthma   . COPD (chronic obstructive pulmonary disease) ([place])   . Coronary artery disease    hx CABG  . GERD (gastroesophageal reflux disease)   . Peripheral vascular disease ([place])   . Pneumonia   . Shortness of breath dyspnea   . Stroke [facility])    "mini stroke" numbness and weakness to left side of the body     No Known Allergies   Current Outpatient Medications  Medication Sig Dispense Refill  .  ALPRAZolam (XANAX) 0.5 MG tablet Take 0.5 mg by mouth 3 (three) times daily.     Marland Kitchen aspirin 81 MG chewable tablet Chew 81 mg by mouth daily.    Marland Kitchen atorvastatin (LIPITOR) 40 MG tablet Take 1 tablet (40 mg total) daily by mouth. 30 tablet 1  . diphenhydramine-acetaminophen (TYLENOL PM) 25-500 MG TABS tablet Take 2-3 tablets by mouth at bedtime.    Marland Kitchen guaiFENesin (MUCINEX) 600 MG 12 hr tablet Take 1 tablet (600 mg total) by mouth 2 (two) times daily as needed for cough or to loosen phlegm.    . metoprolol succinate (TOPROL-XL) 50 MG 24 hr tablet Take 50 mg by mouth daily. Take with or immediately following a meal.    . Multiple Vitamin (MULTIVITAMIN WITH MINERALS) TABS tablet Take 1 tablet by mouth daily.    . mupirocin ointment (BACTROBAN) 2 % Place 1 application into the nose 2 (two) times daily.    . pantoprazole (PROTONIX) 40 MG tablet Take 40 mg by mouth daily.    . potassium chloride SA (K-DUR,KLOR-CON) 20 MEQ tablet Take 20 mEq by mouth daily.   2  . SYMBICORT 160-4.5 MCG/ACT inhaler Inhale 2 puffs into the lungs 2 (two) times daily.   4  . torsemide (DEMADEX) 20 MG tablet Take 1 tablet (20 mg  total) by mouth daily.    . traMADol (ULTRAM) 50 MG tablet Take 1 tablet (50 mg total) by mouth every 6 (six) hours as needed (mild pain). 30 tablet 0  . VENTOLIN HFA 108 (90 BASE) MCG/ACT inhaler Inhale 2 puffs into the lungs every 6 (six) hours as needed for wheezing or shortness of breath.   0   No current facility-administered medications for this visit.     Past Surgical History:  Procedure Laterality Date  . AORTIC ARCH ANGIOGRAPHY N/A 11/04/2017   Procedure: AORTIC ARCH ANGIOGRAPHY;  Surgeon: Elam Dutch, MD;  Location: [place] CV LAB;  Service: Cardiovascular;  Laterality: N/A;  . APPLICATION OF WOUND VAC N/A 01/06/2018   Procedure: APPLICATION OF WOUND VAC;  Surgeon: Ivin Poot, MD;  Location: [place];  Service: Thoracic;  Laterality: N/A;  . APPLICATION OF WOUND VAC N/A  01/13/2018   Procedure: WOUND VAC CHANGE;  Surgeon: Ivin Poot, MD;  Location: [place];  Service: Thoracic;  Laterality: N/A;  . CARDIAC CATHETERIZATION    . CAROTID-SUBCLAVIAN BYPASS GRAFT Right 12/26/2017   Procedure: AORTIC TO RIGHT COMMON CAROTID AND RIGHT SUBCLAVIAN  ARTERY  BYPASS;  Surgeon: Elam Dutch, MD;  Location: [place];  Service: Vascular;  Laterality: Right;  . CHOLECYSTECTOMY    . CLOSURE OF DIAPHRAGM  12/26/2017   Procedure: REPAIR OF DIAPHRAGM;  Surgeon: Ivin Poot, MD;  Location: [place];  Service: Thoracic;;  . COLONOSCOPY N/A 10/03/2014   Procedure: COLONOSCOPY;  Surgeon: Rogene Houston, MD;  [facility];  Service: Endoscopy;  Laterality: N/A;  730  . CORONARY ARTERY BYPASS GRAFT N/A 12/14/2016   Procedure: CORONARY ARTERY BYPASS GRAFTING (CABG) x three , using left internal mammary artery and right leg greater saphenous vein harvested endoscopically;  Surgeon: Ivin Poot, MD;  Location: [place];  Service: Open Heart Surgery;  Laterality: N/A;  . ENDARTERECTOMY Right 12/06/2014   Procedure: ENDARTERECTOMY CAROTid;  Surgeon: Elam Dutch, MD;  [facility] OR;  Service: Vascular;  Laterality: Right;  . ESOPHAGOGASTRODUODENOSCOPY N/A 12/03/2016   Procedure: ESOPHAGOGASTRODUODENOSCOPY (EGD);  Surgeon: Rogene Houston, MD;  [facility];  Service: Endoscopy;  Laterality: N/A;  7:30  . LEFT HEART CATH AND CORONARY ANGIOGRAPHY N/A 12/14/2016   Procedure: LEFT HEART CATH AND CORONARY ANGIOGRAPHY;  Surgeon: Burnell Blanks, MD;  Location: [place] CV LAB;  Service: Cardiovascular;  Laterality: N/A;  . LOBECTOMY Right 10/26/2018   Procedure: RIGHT UPPER LOBECTOMY;  Surgeon: Lajuana Matte, MD;  Location: [place];  Service: Thoracic;  Laterality: Right;  . PERIPHERAL VASCULAR CATHETERIZATION N/A 12/05/2014   Procedure:  Carotid Arch Angiography;  Surgeon: Conrad Conway, MD;  Location: [place] CV LAB;  Service:  Cardiovascular;  Laterality: N/A;  . STERNAL WOUND DEBRIDEMENT N/A 01/06/2018   Procedure: STERNAL WOUND DEBRIDEMENT;  Surgeon: Ivin Poot, MD;  Location: [facility];  Service: Thoracic;  Laterality: N/A;  . STERNOTOMY N/A 12/26/2017   Procedure: REDO STERNOTOMY;  Surgeon: Prescott Gum, Collier Salina, MD;  Location: [place];  Service: Thoracic;  Laterality: N/A;  . TEE WITHOUT CARDIOVERSION N/A 12/14/2016   Procedure: TRANSESOPHAGEAL ECHOCARDIOGRAM (TEE);  Surgeon: Prescott Gum, Collier Salina, MD;  Location: [place];  Service: Open Heart Surgery;  Laterality: N/A;  . VIDEO ASSISTED THORACOSCOPY (VATS)/WEDGE RESECTION Right 10/26/2018   Procedure: VIDEO ASSISTED THORACOSCOPY (VATS)/WEDGE RESECTION;  Surgeon: Lajuana Matte, MD;  Location: [place];  Service: Thoracic;  Laterality: Right;  .  VIDEO BRONCHOSCOPY N/A 10/26/2018   Procedure: VIDEO BRONCHOSCOPY;  Surgeon: Lajuana Matte, MD;  Location: [facility];  Service: Thoracic;  Laterality: N/A;     No Known Allergies    Family History  Problem Relation Age of Onset  . Heart attack Mother   . Cancer Mother   . Diabetes Mother   . Heart disease Mother   . Heart attack Father   . Heart disease Father      Social History Ms. Elster reports that she quit smoking about 3 years ago. She has a 80.00 pack-year smoking history. She has never used smokeless tobacco. Ms. Woodford reports no history of alcohol use.   Review of Systems CONSTITUTIONAL: No weight loss, fever, chills, weakness or fatigue.  HEENT: Eyes: No visual loss, blurred vision, double vision or yellow sclerae.No hearing loss, sneezing, congestion, runny nose or sore throat.  SKIN: No rash or itching.  CARDIOVASCULAR: per hpi RESPIRATORY: No shortness of breath, cough or sputum.  GASTROINTESTINAL: No anorexia, nausea, vomiting or diarrhea. No abdominal pain or blood.  GENITOURINARY: No burning on urination, no polyuria NEUROLOGICAL: No headache, dizziness, syncope, paralysis, ataxia, numbness or  tingling in the extremities. No change in bowel or bladder control.  MUSCULOSKELETAL: No muscle, back pain, joint pain or stiffness.  LYMPHATICS: No enlarged nodes. No history of splenectomy.  PSYCHIATRIC: No history of depression or anxiety.  ENDOCRINOLOGIC: No reports of sweating, cold or heat intolerance. No polyuria or polydipsia.  Marland Kitchen   Physical Examination Today's Vitals   03/21/19 0835  BP: 136/73  Pulse: 88  Temp: (!) 97.5 F (36.4 C)  SpO2: 90%  Weight: 184 lb (83.5 kg)  Height: 5\' 5"  (1.651 m)   Body mass index is 30.62 kg/m.  Gen: resting comfortably, no acute distress HEENT: no scleral icterus, pupils equal round and reactive, no palptable cervical adenopathy,  CV: RRR, 3/6 systolic murmur rusb, bilateral carotid bruits Resp: Clear to auscultation bilaterally GI: abdomen is soft, non-tender, non-distended, normal bowel sounds, no hepatosplenomegaly MSK: extremities are warm, no edema.  Skin: warm, no rash Neuro:  no focal deficits Psych: appropriate affect   Diagnostic Studies  06/2015 echo Study Conclusions  - Left ventricle: The cavity size was normal. Wall thickness was  increased in a pattern of mild concentric LVH, with moderate  focal basal septal hypertrophy. Systolic function was normal. The  estimated ejection fraction was in the range of 60% to 65%. Wall  motion was normal; there were no regional wall motion  abnormalities. Doppler parameters are consistent with abnormal  left ventricular relaxation (grade 1 diastolic dysfunction).  Indeterminate filling pressures. - Aortic valve: Mildly to moderately calcified annulus. Mildly  thickened, mildly calcified leaflets. There was mild stenosis.  Peak velocity (S): 231 cm/s. Mean gradient (S): 9 mm Hg. Valve  area (VTI): 1.69 cm^2. Valve area (Vmean): 1.69 cm^2. - Mitral valve: Mildly calcified annulus. Normal thickness leaflets  .  06/2015 Stress MPI  Equivocal ST segment depression,  0.5 mm in leads II, III, aVF, and V4 through V6. Resolved quickly in recovery. Intermediate risk Duke treadmill score of 1.5 based on limited exercise time and nondiagnostic ST segment change.  Blood pressure demonstrated a hypertensive response to exercise.  Small, mild intensity, basal septal defect that exhibits partial reversibility, suspect related to variable soft tissue attenuation rather than ischemia.  This is a low risk study based on perfusion imaging.  Nuclear stress EF: 75%.  11/2016 cath  Mid RCA lesion, 60 %stenosed.  Prox RCA lesion, 40 %stenosed.  Ost RPDA to RPDA lesion, 90 %stenosed.  Ost Cx to Prox Cx lesion, 100 %stenosed.  Mid LAD lesion, 80 %stenosed.  Ost 2nd Diag to 2nd Diag lesion, 40 %stenosed.  Prox LAD to Mid LAD lesion, 40 %stenosed.  Ost LM to LM lesion, 99 %stenosed.  1. Severe distal left main stenosis 2. Severe mid LAD stenosis 3. 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  4. Moderately severe, calcified stenosis of the mid RCA 5. Elevated filling pressures.   11/2016 echo  Mid RCA lesion, 60 %stenosed.  Prox RCA lesion, 40 %stenosed.  Ost RPDA to RPDA lesion, 90 %stenosed.  Ost Cx to Prox Cx lesion, 100 %stenosed.  Mid LAD lesion, 80 %stenosed.  Ost 2nd Diag to 2nd Diag lesion, 40 %stenosed.  Prox LAD to Mid LAD lesion, 40 %stenosed.  Ost LM to LM lesion, 99 %stenosed.  1. Severe distal left main stenosis 2. Severe mid LAD stenosis 3. 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  4. Moderately severe, calcified stenosis of the mid RCA 5. Elevated filling pressures.   12/2016 echo Study Conclusions  - HPI and indications: Limited for LV function & Rule out effusion. - Left ventricle: Systolic function was mildly reduced. The estimated ejection fraction was in the range of 45% to 50%. - Aortic valve:  Mildly calcified leaflets. Mild to moderate stensois. Mean gradient (S): 17 mm Hg. Peak gradient (S): 28 mm Hg. Valve area (VTI): 1.01 cm^2. Valve area (Vmax): 1.1 cm^2. Valve area (Vmean): 1.02 cm^2.  Impressions:  - Compared to the recent study on 12/12/16, the LVEF is unchaged at 45-50%. There is mild to moderate aortic stenosis. No pericardial effusion is noted.  11/2017 echo Study Conclusions   - Left ventricle: The cavity size was normal. There was mild  concentric hypertrophy. Systolic function was normal. Wall motion  was normal; there were no regional wall motion abnormalities.  Doppler parameters are consistent with abnormal left ventricular  relaxation (grade 1 diastolic dysfunction). Doppler parameters  are consistent with high ventricular filling pressure.  - Aortic valve: Valve mobility was restricted. There was mild  stenosis. There was mild regurgitation. Peak velocity (S): 288  cm/s. Mean gradient (S): 17 mm Hg. Valve area (VTI): 0.83 cm^2.  Valve area (Vmax): 0.85 cm^2. Valve area (Vmean): 0.87 cm^2.  - Mitral valve: Transvalvular velocity was within the normal range.  There was no evidence for stenosis. There was trivial  regurgitation.  - Right ventricle: The cavity size was normal. Wall thickness was  normal. Systolic function was normal.  - Atrial septum: No defect or patent foramen ovale was identified.  - Tricuspid valve: There was mild regurgitation.  - Pulmonary arteries: PA peak pressure: 27 mm Hg (S).    10/2018 nuclear stress  The left ventricular ejection fraction is normal (55-65%).  Nuclear stress EF: 56%.  There was no ST segment deviation noted during stress.  The study is normal.  This is a low risk study.    Assessment and Plan  1. CAD -10/2018 stress test without ischemia - EKG, labs, and CT reviewed from pcp - 2 episodes of chest pain in Nov, 2 episodes in early January. Not exertional. No  recurrent symptoms since early January - we discussed possible cath, she is reluctant. I think given the symptoms are irregular and have not reoccurred in nearly 1 month and  stress test 10/2018 was unremarkable reasonable to try medical therapy - we discussed in detail if progression of symptoms we would need to pursue a cath  2. Carotid stenosis - she will continue medical therapy   3. Aortic stenosis - mild by last echo by mean gradient. Her reported area appears inaccurate. Dimensionless index 0.3 suggesting more moderate AS. Essentialyl I think she has mild to mod AS - continue to monitor.    F/u 6 months      Arnoldo Lenis, M.D.

## 2019-03-21 NOTE — Patient Instructions (Signed)
Medication Instructions:  START IMDUR 15 MG (1/2 TABLET) DAILY   Labwork: NONE  Testing/Procedures: NONE  Follow-Up: Your physician recommends that you schedule a follow-up appointment in: 4 MONTHS    Any Other Special Instructions Will Be Listed Below (If Applicable).     If you need a refill on your cardiac medications before your next appointment, please call your pharmacy.

## 2019-03-30 DIAGNOSIS — C3491 Malignant neoplasm of unspecified part of right bronchus or lung: Secondary | ICD-10-CM | POA: Diagnosis not present

## 2019-03-30 DIAGNOSIS — I2510 Atherosclerotic heart disease of native coronary artery without angina pectoris: Secondary | ICD-10-CM | POA: Diagnosis not present

## 2019-03-30 DIAGNOSIS — Z299 Encounter for prophylactic measures, unspecified: Secondary | ICD-10-CM | POA: Diagnosis not present

## 2019-03-30 DIAGNOSIS — Z87891 Personal history of nicotine dependence: Secondary | ICD-10-CM | POA: Diagnosis not present

## 2019-03-30 DIAGNOSIS — Z6832 Body mass index (BMI) 32.0-32.9, adult: Secondary | ICD-10-CM | POA: Diagnosis not present

## 2019-03-30 DIAGNOSIS — R0989 Other specified symptoms and signs involving the circulatory and respiratory systems: Secondary | ICD-10-CM | POA: Diagnosis not present

## 2019-03-30 DIAGNOSIS — J439 Emphysema, unspecified: Secondary | ICD-10-CM | POA: Diagnosis not present

## 2019-04-15 DIAGNOSIS — E785 Hyperlipidemia, unspecified: Secondary | ICD-10-CM | POA: Diagnosis not present

## 2019-04-15 DIAGNOSIS — I10 Essential (primary) hypertension: Secondary | ICD-10-CM | POA: Diagnosis not present

## 2019-05-21 ENCOUNTER — Other Ambulatory Visit: Payer: Self-pay

## 2019-05-21 NOTE — Patient Outreach (Signed)
[facility]) Care Management  05/21/2019  Emily Rocha [birth year] [id number]   Telephone Assessment    Outreach attempt to patient. No answer after several rings.      Plan: RN CM will make outreach attempt to patient within the month of May if no return call from patient.   Enzo Montgomery, RN,BSN,CCM [place] Management Telephonic Care Management Coordinator Direct Phone: [phone] Toll Free: [phone] Fax: [phone]

## 2019-05-24 ENCOUNTER — Other Ambulatory Visit: Payer: Self-pay | Admitting: Thoracic Surgery (Cardiothoracic Vascular Surgery)

## 2019-05-24 DIAGNOSIS — Z902 Acquired absence of lung [part of]: Secondary | ICD-10-CM

## 2019-05-25 ENCOUNTER — Ambulatory Visit
Admission: RE | Admit: 2019-05-25 | Discharge: 2019-05-25 | Disposition: A | Discharge status: Home / Self Care | Payer: Medicare HMO | Source: Ambulatory Visit | Attending: Thoracic Surgery (Cardiothoracic Vascular Surgery) | Admitting: Thoracic Surgery (Cardiothoracic Vascular Surgery)

## 2019-05-25 ENCOUNTER — Encounter: Payer: Self-pay | Admitting: Thoracic Surgery (Cardiothoracic Vascular Surgery)

## 2019-05-25 ENCOUNTER — Other Ambulatory Visit: Payer: Self-pay

## 2019-05-25 ENCOUNTER — Ambulatory Visit: Payer: Medicare HMO | Admitting: Thoracic Surgery (Cardiothoracic Vascular Surgery)

## 2019-05-25 VITALS — BP 150/53 | HR 89 | Temp 97.6°F | Resp 20 | Ht 65.0 in | Wt 182.0 lb

## 2019-05-25 DIAGNOSIS — Z951 Presence of aortocoronary bypass graft: Secondary | ICD-10-CM

## 2019-05-25 DIAGNOSIS — Z902 Acquired absence of lung [part of]: Secondary | ICD-10-CM | POA: Diagnosis not present

## 2019-05-25 DIAGNOSIS — J984 Other disorders of lung: Secondary | ICD-10-CM | POA: Diagnosis not present

## 2019-05-25 NOTE — Progress Notes (Signed)
      [address]       [place] [address]             [id number]        Jouri H Dolney Dalzell Medical Record #[id number] Date of Birth: [birth year]  Referring: Ivin Poot, MD Primary Care: Glenda Chroman, MD Primary Cardiologist:No primary care provider on file.  Reason for visit:   follow-up  History of Present Illness:     Mrs. Docter presents for 88-month follow-up appointment.  She has done well for the most part.  She continues to refrain from smoking.  She has had some shortness of breath with exertion and has been using her rescue inhaler a fair amount.  Otherwise she is doing well  Physical Exam: BP (!) 150/53   Pulse 89   Temp 97.6 F (36.4 C) (Skin)   Resp 20   Ht 5\' 5"  (1.651 m)   Wt 182 lb (82.6 kg)   SpO2 95% Comment: RA  BMI 30.29 kg/m   Alert NAD Incision clean.   Trace peripheral edema   Diagnostic Studies & Laboratory data: CXR: Clear     Assessment / Plan:   64 year old female with history of T1 cN0 M0 adenocarcinoma the right upper lobe status post resection.  Currently doing well.  Regards her symptoms of her to Dr. Valeta Harms for optimization of her pulmonary status.  We will also refer her to [facility] for continued long cancer screening.  I will see her back in September with a CT chest.   Lajuana Matte 05/25/2019 4:35 PM

## 2019-06-04 DIAGNOSIS — F419 Anxiety disorder, unspecified: Secondary | ICD-10-CM | POA: Diagnosis not present

## 2019-06-04 DIAGNOSIS — Z299 Encounter for prophylactic measures, unspecified: Secondary | ICD-10-CM | POA: Diagnosis not present

## 2019-06-04 DIAGNOSIS — I4891 Unspecified atrial fibrillation: Secondary | ICD-10-CM | POA: Diagnosis not present

## 2019-06-04 DIAGNOSIS — J439 Emphysema, unspecified: Secondary | ICD-10-CM | POA: Diagnosis not present

## 2019-06-04 DIAGNOSIS — I2510 Atherosclerotic heart disease of native coronary artery without angina pectoris: Secondary | ICD-10-CM | POA: Diagnosis not present

## 2019-06-21 ENCOUNTER — Other Ambulatory Visit: Payer: Self-pay

## 2019-06-21 NOTE — Patient Outreach (Signed)
[facility]) Care Management  06/21/2019  Emily Rocha 07-Apr-1955 [id number]   Telephone Assessment    Outreach attempt # 1 to patient. No answer at present. RN CM left HIPAA compliant voicemail message along with contact info.       Plan: RN CM will send unsuccessful outreach letter to patient. RN CM will make outreach attempt to patient within the month of June if no return call.     Enzo Montgomery, RN,BSN,CCM [place] [facility] Telephonic Care Management Coordinator Direct Phone: [phone] Toll Free: [phone] Fax: [phone]

## 2019-06-22 ENCOUNTER — Ambulatory Visit: Payer: Self-pay

## 2019-07-05 ENCOUNTER — Other Ambulatory Visit: Payer: Self-pay

## 2019-07-05 DIAGNOSIS — Z902 Acquired absence of lung [part of]: Secondary | ICD-10-CM

## 2019-07-05 NOTE — Progress Notes (Signed)
Per Dr. Kipp Brood to screen for lung cancer. To be seen by Mare Ferrari, NP for yearly lung cancer screening.

## 2019-07-06 ENCOUNTER — Other Ambulatory Visit: Payer: Self-pay

## 2019-07-06 DIAGNOSIS — Z85118 Personal history of other malignant neoplasm of bronchus and lung: Secondary | ICD-10-CM

## 2019-07-11 ENCOUNTER — Other Ambulatory Visit: Payer: Self-pay

## 2019-07-11 ENCOUNTER — Ambulatory Visit: Payer: Medicare HMO | Admitting: Pulmonary Disease

## 2019-07-11 ENCOUNTER — Encounter: Payer: Self-pay | Admitting: Pulmonary Disease

## 2019-07-11 ENCOUNTER — Other Ambulatory Visit (INDEPENDENT_AMBULATORY_CARE_PROVIDER_SITE_OTHER): Payer: Medicare HMO

## 2019-07-11 VITALS — BP 124/74 | HR 73 | Temp 97.8°F | Ht 65.0 in | Wt 185.4 lb

## 2019-07-11 DIAGNOSIS — J449 Chronic obstructive pulmonary disease, unspecified: Secondary | ICD-10-CM | POA: Diagnosis not present

## 2019-07-11 DIAGNOSIS — J441 Chronic obstructive pulmonary disease with (acute) exacerbation: Secondary | ICD-10-CM

## 2019-07-11 LAB — CBC WITH DIFFERENTIAL/PLATELET
Basophils Absolute: 0.1 10*3/uL (ref 0.0–0.1)
Basophils Relative: 0.9 % (ref 0.0–3.0)
Eosinophils Absolute: 0.1 10*3/uL (ref 0.0–0.7)
Eosinophils Relative: 1.6 % (ref 0.0–5.0)
HCT: 39.7 % (ref 36.0–46.0)
Hemoglobin: 12.9 g/dL (ref 12.0–15.0)
Lymphocytes Relative: 26 % (ref 12.0–46.0)
Lymphs Abs: 1.7 10*3/uL (ref 0.7–4.0)
MCHC: 32.4 g/dL (ref 30.0–36.0)
MCV: 79.1 fl (ref 78.0–100.0)
Monocytes Absolute: 0.6 10*3/uL (ref 0.1–1.0)
Monocytes Relative: 8.6 % (ref 3.0–12.0)
Neutro Abs: 4.2 10*3/uL (ref 1.4–7.7)
Neutrophils Relative %: 62.9 % (ref 43.0–77.0)
Platelets: 204 10*3/uL (ref 150.0–400.0)
RBC: 5.02 Mil/uL (ref 3.87–5.11)
RDW: 17.9 % — ABNORMAL HIGH (ref 11.5–15.5)
WBC: 6.6 10*3/uL (ref 4.0–10.5)

## 2019-07-11 MED ORDER — ANORO ELLIPTA 62.5-25 MCG/INH IN AEPB: 1.0000 | INHALATION_SPRAY | Freq: Every day | RESPIRATORY_TRACT | 2 refills | Status: DC

## 2019-07-11 MED ORDER — ANORO ELLIPTA 62.5-25 MCG/INH IN AEPB: 1.0000 | INHALATION_SPRAY | Freq: Every day | RESPIRATORY_TRACT | 0 refills | Status: DC

## 2019-07-11 NOTE — Patient Instructions (Signed)
We will check a CBC with differential, IgE and alpha-1 antitrypsin levels today We will start you on an inhaler called Anoro.  Use this instead of Symbicort We will refer you to pulmonary rehab at hospital Check oxygen levels on exertion.  Follow-up in 3 months.

## 2019-07-11 NOTE — Addendum Note (Signed)
Addended by: Elton Sin on: 07/11/2019 03:53 PM   Modules accepted: Orders

## 2019-07-11 NOTE — Progress Notes (Signed)
Emily Rocha    [id number]    11-06-1955  Primary Care Physician:Vyas, Costella Hatcher, MD  Referring Physician: Glenda Chroman, MD [place],  [place] [address]  Chief complaint: Consult for COPD  HPI: 64 year old with history of COPD, T1 cN0 M0 adenocarcinoma the right upper lobe  s/p R VATS, RULectomy on 10/26/2018 Referred for management of COPD Maintained on Symbicort inhaler.  Notes increasing dyspnea for the past year, more so after her lobectomy.  Denies any cough, sputum production, fevers, chills.  Follows with Dr. Harl Bowie for management of coronary artery disease status post CABG and aortic stenosis  Pets: No pets Occupation: Optometrist at [employer] Exposures: No known exposures.  No mold, hot tub, Jacuzzi.  No visible or comforters Smoking history: 80-pack-year smoker.  Quit in 2018 Travel history: Previously lived in [place], [place], [place].  No significant recent travel Relevant family history: No significant family history of lung disease.  MDM Reviewed operative notes, surgery follow-up, imaging, PFTs and labs.   Outpatient Encounter Medications as of 07/11/2019  Medication Sig  . albuterol (PROVENTIL) (2.5 MG/3ML) 0.083% nebulizer solution Take 2.5 mg by nebulization every 4 (four) hours as needed.  . ALPRAZolam (XANAX) 0.5 MG tablet Take 0.5 mg by mouth 3 (three) times daily.   Marland Kitchen aspirin 81 MG chewable tablet Chew 81 mg by mouth daily.  Marland Kitchen atorvastatin (LIPITOR) 40 MG tablet Take 1 tablet (40 mg total) daily by mouth.  . diphenhydramine-acetaminophen (TYLENOL PM) 25-500 MG TABS tablet Take 2-3 tablets by mouth at bedtime.  . isosorbide mononitrate (IMDUR) 30 MG 24 hr tablet Take 0.5 tablets (15 mg total) by mouth daily.  . metoprolol succinate (TOPROL-XL) 50 MG 24 hr tablet Take 50 mg by mouth daily. Take with or immediately following a meal.  . Multiple Vitamin (MULTIVITAMIN WITH MINERALS) TABS tablet Take 1 tablet by mouth daily.  . pantoprazole  (PROTONIX) 40 MG tablet Take 40 mg by mouth daily.  . potassium chloride SA (K-DUR,KLOR-CON) 20 MEQ tablet Take 20 mEq by mouth daily.   . SYMBICORT 160-4.5 MCG/ACT inhaler Inhale 2 puffs into the lungs 2 (two) times daily.   Marland Kitchen torsemide (DEMADEX) 20 MG tablet Take 1 tablet (20 mg total) by mouth daily.  . VENTOLIN HFA 108 (90 BASE) MCG/ACT inhaler Inhale 2 puffs into the lungs every 6 (six) hours as needed for wheezing or shortness of breath.   . [DISCONTINUED] guaiFENesin (MUCINEX) 600 MG 12 hr tablet Take 1 tablet (600 mg total) by mouth 2 (two) times daily as needed for cough or to loosen phlegm.  . [DISCONTINUED] traMADol (ULTRAM) 50 MG tablet Take 1 tablet (50 mg total) by mouth every 6 (six) hours as needed (mild pain). (Patient not taking: Reported on 05/25/2019)   No facility-administered encounter medications on file as of 07/11/2019.    Allergies as of 07/11/2019  . (No Known Allergies)    Past Medical History:  Diagnosis Date  . Anxiety   . Asthma   . COPD (chronic obstructive pulmonary disease) ([facility])   . Coronary artery disease    hx CABG  . GERD (gastroesophageal reflux disease)   . Peripheral vascular disease ([facility])   . Pneumonia   . Shortness of breath dyspnea   . Stroke [facility])    "mini stroke" numbness and weakness to left side of the body    Past Surgical History:  Procedure Laterality Date  . AORTIC ARCH ANGIOGRAPHY N/A  11/04/2017   Procedure: AORTIC ARCH ANGIOGRAPHY;  Surgeon: Elam Dutch, MD;  Location: [facility];  Service: Cardiovascular;  Laterality: N/A;  . APPLICATION OF WOUND VAC N/A 01/06/2018   Procedure: APPLICATION OF WOUND VAC;  Surgeon: Ivin Poot, MD;  Location: [facility];  Service: Thoracic;  Laterality: N/A;  . APPLICATION OF WOUND VAC N/A 01/13/2018   Procedure: WOUND VAC CHANGE;  Surgeon: Ivin Poot, MD;  Location: [facility];  Service: Thoracic;  Laterality: N/A;  . CARDIAC CATHETERIZATION    . CAROTID-SUBCLAVIAN BYPASS GRAFT  Right 12/26/2017   Procedure: AORTIC TO RIGHT COMMON CAROTID AND RIGHT SUBCLAVIAN  ARTERY  BYPASS;  Surgeon: Elam Dutch, MD;  Location: [facility];  Service: Vascular;  Laterality: Right;  . CHOLECYSTECTOMY    . CLOSURE OF DIAPHRAGM  12/26/2017   Procedure: REPAIR OF DIAPHRAGM;  Surgeon: Ivin Poot, MD;  Location: [facility];  Service: Thoracic;;  . COLONOSCOPY N/A 10/03/2014   Procedure: COLONOSCOPY;  Surgeon: Rogene Houston, MD;  [facility];  Service: Endoscopy;  Laterality: N/A;  730  . CORONARY ARTERY BYPASS GRAFT N/A 12/14/2016   Procedure: CORONARY ARTERY BYPASS GRAFTING (CABG) x three , using left internal mammary artery and right leg greater saphenous vein harvested endoscopically;  Surgeon: Ivin Poot, MD;  Location: [facility];  Service: Open Heart Surgery;  Laterality: N/A;  . ENDARTERECTOMY Right 12/06/2014   Procedure: ENDARTERECTOMY CAROTid;  Surgeon: Elam Dutch, MD;  Location: [facility];  Service: Vascular;  Laterality: Right;  . ESOPHAGOGASTRODUODENOSCOPY N/A 12/03/2016   Procedure: ESOPHAGOGASTRODUODENOSCOPY (EGD);  Surgeon: Rogene Houston, MD;  [facility];  Service: Endoscopy;  Laterality: N/A;  7:30  . LEFT HEART CATH AND CORONARY ANGIOGRAPHY N/A 12/14/2016   Procedure: LEFT HEART CATH AND CORONARY ANGIOGRAPHY;  Surgeon: Burnell Blanks, MD;  Location: [facility];  Service: Cardiovascular;  Laterality: N/A;  . LOBECTOMY Right 10/26/2018   Procedure: RIGHT UPPER LOBECTOMY;  Surgeon: Lajuana Matte, MD;  Location: [facility];  Service: Thoracic;  Laterality: Right;  . PERIPHERAL VASCULAR CATHETERIZATION N/A 12/05/2014   Procedure:  Carotid Arch Angiography;  Surgeon: Conrad Vredenburgh, MD;  Location: [facility];  Service: Cardiovascular;  Laterality: N/A;  . STERNAL WOUND DEBRIDEMENT N/A 01/06/2018   Procedure: STERNAL WOUND DEBRIDEMENT;  Surgeon: Ivin Poot, MD;  Location: [facility];  Service: Thoracic;  Laterality: N/A;  .  STERNOTOMY N/A 12/26/2017   Procedure: REDO STERNOTOMY;  Surgeon: Prescott Gum, Collier Salina, MD;  Location: [facility];  Service: Thoracic;  Laterality: N/A;  . TEE WITHOUT CARDIOVERSION N/A 12/14/2016   Procedure: TRANSESOPHAGEAL ECHOCARDIOGRAM (TEE);  Surgeon: Prescott Gum, Collier Salina, MD;  Location: [facility];  Service: Open Heart Surgery;  Laterality: N/A;  . VIDEO ASSISTED THORACOSCOPY (VATS)/WEDGE RESECTION Right 10/26/2018   Procedure: VIDEO ASSISTED THORACOSCOPY (VATS)/WEDGE RESECTION;  Surgeon: Lajuana Matte, MD;  Location: [facility];  Service: Thoracic;  Laterality: Right;  Marland Kitchen VIDEO BRONCHOSCOPY N/A 10/26/2018   Procedure: VIDEO BRONCHOSCOPY;  Surgeon: Lajuana Matte, MD;  [facility];  Service: Thoracic;  Laterality: N/A;    Family History  Problem Relation Age of Onset  . Heart attack Mother   . Cancer Mother   . Diabetes Mother   . Heart disease Mother   . Heart attack Father   . Heart disease Father     Social History   Socioeconomic History  . Marital status: Married  Spouse name: Not on file  . Number of children: Not on file  . Years of education: Not on file  . Highest education level: Not on file  Occupational History  . Not on file  Tobacco Use  . Smoking status: Former Smoker    Packs/day: 2.00    Years: 40.00    Pack years: 80.00    Quit date: 10/24/2015    Years since quitting: 3.7  . Smokeless tobacco: Never Used  Substance and Sexual Activity  . Alcohol use: No    Alcohol/week: 0.0 standard drinks  . Drug use: No  . Sexual activity: Not on file  Other Topics Concern  . Not on file  Social History Narrative  . Not on file   Social Determinants of Health   Financial Resource Strain:   . Difficulty of Paying Living Expenses:   Food Insecurity:   . Worried About Charity fundraiser in the Last Year:   . Arboriculturist in the Last Year:   Transportation Needs:   . Film/video editor (Medical):   Marland Kitchen Lack of Transportation (Non-Medical):   Physical  Activity:   . Days of Exercise per Week:   . Minutes of Exercise per Session:   Stress:   . Feeling of Stress :   Social Connections:   . Frequency of Communication with Friends and Family:   . Frequency of Social Gatherings with Friends and Family:   . Attends Religious Services:   . Active Member of Clubs or Organizations:   . Attends Archivist Meetings:   Marland Kitchen Marital Status:   Intimate Partner Violence:   . Fear of Current or Ex-Partner:   . Emotionally Abused:   Marland Kitchen Physically Abused:   . Sexually Abused:     Review of systems: Review of Systems  Constitutional: Negative for fever and chills.  HENT: Negative.   Eyes: Negative for blurred vision.  Respiratory: as per HPI  Cardiovascular: Negative for chest pain and palpitations.  Gastrointestinal: Negative for vomiting, diarrhea, blood per rectum. Genitourinary: Negative for dysuria, urgency, frequency and hematuria.  Musculoskeletal: Negative for myalgias, back pain and joint pain.  Skin: Negative for itching and rash.  Neurological: Negative for dizziness, tremors, focal weakness, seizures and loss of consciousness.  Endo/Heme/Allergies: Negative for environmental allergies.  Psychiatric/Behavioral: Negative for depression, suicidal ideas and hallucinations.  All other systems reviewed and are negative.  Physical Exam: Blood pressure 124/74, pulse 73, temperature 97.8 F (36.6 C), temperature source Oral, height 5\' 5"  (1.651 m), weight 185 lb 6.4 oz (84.1 kg), SpO2 97 %. Gen:      No acute distress HEENT:  EOMI, sclera anicteric Neck:     No masses; no thyromegaly Lungs:    Clear to auscultation bilaterally; normal respiratory effort CV:         Systolic murmur, regular rate Abd:      + bowel sounds; soft, non-tender; no palpable masses, no distension Ext:    No edema; adequate peripheral perfusion Skin:      Warm and dry; no rash Neuro: alert and oriented x 3 Psych: normal mood and affect  Data  Reviewed: Imaging: CT chest 08/09/2018-lobulated right upper lobe nodule: Moderate centrilobular emphysema. CTA 02/27/2019-negative for PE, postsurgical changes in the right upper lobe with small loculated right effusion.  Stable left lung subcentimeter nodules.  I have reviewed the images personally.  PFTs: 09/21/2018 FVC 1.70 (51%), FEV1 1.08 (42%), F/F 63 9, TLC 4.07 [78%), DLCO  12.14 [9%] Severe obstruction with mild restriction and moderate diffusion defect.   Assessment:  Severe COPD We will stop the Symbicort and change her to a LABA/LAMA inhaler.  I do not believe she will require inhaled corticosteroids and will benefit from dual bronchodilator therapy Check CBC differential, IgE and alpha-1 antitrypsin for baseline evaluation Refer to pulmonary rehab.   She did not desat on exertion.  History of lung cancer No evidence of recurrence on last CT scan.  Referred for annual low-dose screening CTs of the chest.  Plan/Recommendations: Stop Symbicort, start Anoro CBC, IgE, alpha-1 antitrypsin Pulmonary rehab  Marshell Garfinkel MD [facility] 07/11/2019, 11:58 AM  CC: Glenda Chroman, MD

## 2019-07-17 ENCOUNTER — Telehealth: Payer: Self-pay | Admitting: Pulmonary Disease

## 2019-07-17 NOTE — Telephone Encounter (Signed)
Pt returning a phone call. Pt can be reached at [phone].

## 2019-07-17 NOTE — Telephone Encounter (Signed)
PA request was received from (pharmacy): [facility] Phone: [phone]  Medication name and strength: Anoro Ordering Provider: Dr. Vaughan Browner  Was PA started with [facility]?: YES If yes, please enter KEY: [id number] Medication tried and failed: Incruse, Spiriva 52mcg, Symbicort 171mcg, Dulera 100 & 228mcg Covered Alternatives: Bevespi and Stiolto   PA sent to plan, time frame for approval / denial: 24-48 hours Routing to [employer]. for follow-up

## 2019-07-17 NOTE — Telephone Encounter (Addendum)
I called pt she is requesting samples of Anoro. She is waiting for financial assistance because the Anoro is 500 dollars at the pharmacy. I offered her a sample of Anoro until her financial assistance comes through. Pt agreed and I left sample up front for her to pick up. Nothing further is needed.

## 2019-07-17 NOTE — Telephone Encounter (Signed)
ATC patient unable to reach, left message to call office back,   Looks like patient is using ANORO, we just need to know if patient assistance was sent in or waiting on something else.

## 2019-07-18 NOTE — Telephone Encounter (Signed)
PA for Anoro was denied despite patient having a history of trying and failing Incruse, Spiriva 10mcg, Symbicort 133mcg and both Duleras. Her insurance is requiring her to try and fail both Bevespi and [place].   Dr. Vaughan Browner, please advise if you want to switch her to [facility] or [facility]. Thanks!

## 2019-07-18 NOTE — Telephone Encounter (Signed)
Try Stiolto please

## 2019-07-19 MED ORDER — STIOLTO RESPIMAT 2.5-2.5 MCG/ACT IN AERS
2.0000 | INHALATION_SPRAY | Freq: Every day | RESPIRATORY_TRACT | 4 refills | Status: DC
Start: 2019-07-19 — End: 2019-11-06

## 2019-07-19 NOTE — Telephone Encounter (Signed)
Spoke with patient. She is aware of the inhaler change and the directions. Since she lives in [place], I asked her to ask the pharmacist to demonstrate how to use the inhaler since it is different from [facility]. She verbalized understanding.   RX has been sent to pharmacy.   Nothing further needed at time of call.

## 2019-07-22 LAB — ALPHA-1 ANTITRYPSIN PHENOTYPE: A-1 Antitrypsin, Ser: 169 mg/dL (ref 83–199)

## 2019-07-22 LAB — IGE: IgE (Immunoglobulin E), Serum: 3 kU/L (ref <=114)

## 2019-07-26 ENCOUNTER — Telehealth: Payer: Self-pay | Admitting: Cardiology

## 2019-07-26 NOTE — Telephone Encounter (Signed)
  Patient Consent for Virtual Visit         Emily Rocha has provided verbal consent on 07/26/2019 for a virtual visit (video or telephone).   CONSENT FOR VIRTUAL VISIT FOR:  Emily Rocha  By participating in this virtual visit I agree to the following:  I hereby voluntarily request, consent and authorize [facility] and its employed or contracted physicians, physician assistants, nurse practitioners or other licensed health care professionals (the Practitioner), to provide me with telemedicine health care services (the "Services") as deemed necessary by the treating Practitioner. I acknowledge and consent to receive the Services by the Practitioner via telemedicine. I understand that the telemedicine visit will involve communicating with the Practitioner through live audiovisual communication technology and the disclosure of certain medical information by electronic transmission. I acknowledge that I have been given the opportunity to request an in-person assessment or other available alternative prior to the telemedicine visit and am voluntarily participating in the telemedicine visit.  I understand that I have the right to withhold or withdraw my consent to the use of telemedicine in the course of my care at any time, without affecting my right to future care or treatment, and that the Practitioner or I may terminate the telemedicine visit at any time. I understand that I have the right to inspect all information obtained and/or recorded in the course of the telemedicine visit and may receive copies of available information for a reasonable fee.  I understand that some of the potential risks of receiving the Services via telemedicine include:  Marland Kitchen Delay or interruption in medical evaluation due to technological equipment failure or disruption; . Information transmitted may not be sufficient (e.g. poor resolution of images) to allow for appropriate medical decision making by the Practitioner;  and/or  . In rare instances, security protocols could fail, causing a breach of personal health information.  Furthermore, I acknowledge that it is my responsibility to provide information about my medical history, conditions and care that is complete and accurate to the best of my ability. I acknowledge that Practitioner's advice, recommendations, and/or decision may be based on factors not within their control, such as incomplete or inaccurate data provided by me or distortions of diagnostic images or specimens that may result from electronic transmissions. I understand that the practice of medicine is not an exact science and that Practitioner makes no warranties or guarantees regarding treatment outcomes. I acknowledge that a copy of this consent can be made available to me via my patient portal ([facility]), or I can request a printed copy by calling the office of [facility].    I understand that my insurance will be billed for this visit.   I have read or had this consent read to me. . I understand the contents of this consent, which adequately explains the benefits and risks of the Services being provided via telemedicine.  . I have been provided ample opportunity to ask questions regarding this consent and the Services and have had my questions answered to my satisfaction. . I give my informed consent for the services to be provided through the use of telemedicine in my medical care

## 2019-07-27 ENCOUNTER — Telehealth (INDEPENDENT_AMBULATORY_CARE_PROVIDER_SITE_OTHER): Payer: Medicare HMO | Admitting: Cardiology

## 2019-07-27 ENCOUNTER — Encounter: Payer: Self-pay | Admitting: Cardiology

## 2019-07-27 VITALS — BP 130/80 | Ht 65.0 in | Wt 182.0 lb

## 2019-07-27 DIAGNOSIS — I2510 Atherosclerotic heart disease of native coronary artery without angina pectoris: Secondary | ICD-10-CM | POA: Diagnosis not present

## 2019-07-27 DIAGNOSIS — I6523 Occlusion and stenosis of bilateral carotid arteries: Secondary | ICD-10-CM | POA: Diagnosis not present

## 2019-07-27 DIAGNOSIS — I350 Nonrheumatic aortic (valve) stenosis: Secondary | ICD-10-CM | POA: Diagnosis not present

## 2019-07-27 NOTE — Patient Instructions (Addendum)
Medication Instructions:  Your physician recommends that you continue on your current medications as directed. Please refer to the Current Medication list given to you today.  *If you need a refill on your cardiac medications before your next appointment, please call your pharmacy*   Lab Work: None today If you have labs (blood work) drawn today and your tests are completely normal, you will receive your results only by: Marland Kitchen MyChart Message (if you have MyChart) OR . A paper copy in the mail If you have any lab test that is abnormal or we need to change your treatment, we will call you to review the results.   Testing/Procedures: Your physician has requested that you have an echocardiogram. Echocardiography is a painless test that uses sound waves to create images of your heart. It provides your doctor with information about the size and shape of your heart and how well your heart's chambers and valves are working. This procedure takes approximately one hour. There are no restrictions for this procedure.     Follow-Up: At [facility], you and your health needs are our priority.  As part of our continuing mission to provide you with exceptional heart care, we have created designated Provider Care Teams.  These Care Teams include your primary Cardiologist (physician) and Advanced Practice Providers (APPs -  Physician Assistants and Nurse Practitioners) who all work together to provide you with the care you need, when you need it.  We recommend signing up for the patient portal called "MyChart".  Sign up information is provided on this After Visit Summary.  MyChart is used to connect with patients for Virtual Visits (Telemedicine).  Patients are able to view lab/test results, encounter notes, upcoming appointments, etc.  Non-urgent messages can be sent to your provider as well.   To learn more about what you can do with MyChart, go to [email or web address].    Your next appointment:   6  month(s)  The format for your next appointment:   Either In Person or Virtual  Provider:   Carlyle Dolly, MD   Other Instructions Dr.Branch has message Dr.Fields to see what type of ultrasound you need.  Dr.Charles Fields wants carotid ultrasound,order placed.[facility] office staff to call to schedule.

## 2019-07-27 NOTE — Progress Notes (Signed)
Virtual Visit via Telephone Note   This visit type was conducted due to national recommendations for restrictions regarding the COVID-19 Pandemic (e.g. social distancing) in an effort to limit this patient's exposure and mitigate transmission in our community.  Due to her co-morbid illnesses, this patient is at least at moderate risk for complications without adequate follow up.  This format is felt to be most appropriate for this patient at this time.  The patient did not have access to video technology/had technical difficulties with video requiring transitioning to audio format only (telephone).  All issues noted in this document were discussed and addressed.  No physical exam could be performed with this format.  Please refer to the patient's chart for her  consent to telehealth for [facility].   The patient was identified using 2 identifiers.  Date:  07/27/2019   ID:  Emily Rocha, DOB 1955/03/06, MRN [id number]  Patient Location: Home Provider Location: Office  PCP:  Glenda Chroman, MD  Cardiologist:  Dr Carlyle Dolly MD Electrophysiologist:  None   Evaluation Performed:  Follow-Up Visit  Chief Complaint:  Follow up visit  History of Present Illness:    Emily Rocha is a 64 y.o. female seen today for follow up of the following medical problems.   1. CAD - s/p CABG (LIMA-LAD, SVG-LCX, SVG-Diag) after presenting 11/2016 with NSTEMI. Presentation complicated by cardiogenic shock - echo 12/2016 LVEF 45-50% - developed wound infection followed by CT surgery that has now healed.  - currentlty in cardiac rehab - plavix stoppedpreviouslyby CT surgery   11/2017 echo LVEF normal LVEF, grade I DDX, mild AS, mild AI,  10/2018 nuclear stress no ischemia  - no recent chest pain, prior symptoms have resolved. Some SOB she relates to her COPD.  - compliant with meds    2. Carotid stenosis - history of prior symptomatic carotid stenosis with TIAs.  - history of prior  right CEA - found to have high grade stenosis right inonominate artery, she is s/p right subclavian and right common carotid bypass 12/2017 - followed by vascular,   4.Aortic stenosis -mild to mod AS by last echo    5. COPD - followed by pulmonary - on home O2  6. Lung cancer -s/p lung wedge resection - compleltely removed   The patient does not have symptoms concerning for COVID-19 infection (fever, chills, cough, or new shortness of breath).    Past Medical History:  Diagnosis Date  . Anxiety   . Asthma   . COPD (chronic obstructive pulmonary disease) ([place])   . Coronary artery disease    hx CABG  . GERD (gastroesophageal reflux disease)   . Peripheral vascular disease ([place])   . Pneumonia   . Shortness of breath dyspnea   . Stroke [facility])    "mini stroke" numbness and weakness to left side of the body   Past Surgical History:  Procedure Laterality Date  . AORTIC ARCH ANGIOGRAPHY N/A 11/04/2017   Procedure: AORTIC ARCH ANGIOGRAPHY;  Surgeon: Elam Dutch, MD;  Location: [place] [facility];  Service: Cardiovascular;  Laterality: N/A;  . APPLICATION OF WOUND VAC N/A 01/06/2018   Procedure: APPLICATION OF WOUND VAC;  Surgeon: Ivin Poot, MD;  Location: [place];  Service: Thoracic;  Laterality: N/A;  . APPLICATION OF WOUND VAC N/A 01/13/2018   Procedure: WOUND VAC CHANGE;  Surgeon: Ivin Poot, MD;  Location: [place];  Service: Thoracic;  Laterality: N/A;  . CARDIAC CATHETERIZATION    .  CAROTID-SUBCLAVIAN BYPASS GRAFT Right 12/26/2017   Procedure: AORTIC TO RIGHT COMMON CAROTID AND RIGHT SUBCLAVIAN  ARTERY  BYPASS;  Surgeon: Elam Dutch, MD;  Location: [place];  Service: Vascular;  Laterality: Right;  . CHOLECYSTECTOMY    . CLOSURE OF DIAPHRAGM  12/26/2017   Procedure: REPAIR OF DIAPHRAGM;  Surgeon: Ivin Poot, MD;  Location: [place];  Service: Thoracic;;  . COLONOSCOPY N/A 10/03/2014   Procedure: COLONOSCOPY;  Surgeon: Rogene Houston, MD;   [facility];  Service: Endoscopy;  Laterality: N/A;  730  . CORONARY ARTERY BYPASS GRAFT N/A 12/14/2016   Procedure: CORONARY ARTERY BYPASS GRAFTING (CABG) x three , using left internal mammary artery and right leg greater saphenous vein harvested endoscopically;  Surgeon: Ivin Poot, MD;  Location: [place];  Service: Open Heart Surgery;  Laterality: N/A;  . ENDARTERECTOMY Right 12/06/2014   Procedure: ENDARTERECTOMY CAROTid;  Surgeon: Elam Dutch, MD;  Location: [facility] OR;  Service: Vascular;  Laterality: Right;  . ESOPHAGOGASTRODUODENOSCOPY N/A 12/03/2016   Procedure: ESOPHAGOGASTRODUODENOSCOPY (EGD);  Surgeon: Rogene Houston, MD;  [facility];  Service: Endoscopy;  Laterality: N/A;  7:30  . LEFT HEART CATH AND CORONARY ANGIOGRAPHY N/A 12/14/2016   Procedure: LEFT HEART CATH AND CORONARY ANGIOGRAPHY;  Surgeon: Burnell Blanks, MD;  Location: [place] [facility];  Service: Cardiovascular;  Laterality: N/A;  . LOBECTOMY Right 10/26/2018   Procedure: RIGHT UPPER LOBECTOMY;  Surgeon: Lajuana Matte, MD;  Location: [place];  Service: Thoracic;  Laterality: Right;  . PERIPHERAL VASCULAR CATHETERIZATION N/A 12/05/2014   Procedure:  Carotid Arch Angiography;  Surgeon: Conrad Tumwater, MD;  Location: [place] [facility];  Service: Cardiovascular;  Laterality: N/A;  . STERNAL WOUND DEBRIDEMENT N/A 01/06/2018   Procedure: STERNAL WOUND DEBRIDEMENT;  Surgeon: Ivin Poot, MD;  Location: [place];  Service: Thoracic;  Laterality: N/A;  . STERNOTOMY N/A 12/26/2017   Procedure: REDO STERNOTOMY;  Surgeon: Prescott Gum, Collier Salina, MD;  Location: [place];  Service: Thoracic;  Laterality: N/A;  . TEE WITHOUT CARDIOVERSION N/A 12/14/2016   Procedure: TRANSESOPHAGEAL ECHOCARDIOGRAM (TEE);  Surgeon: Prescott Gum, Collier Salina, MD;  Location: [place];  Service: Open Heart Surgery;  Laterality: N/A;  . VIDEO ASSISTED THORACOSCOPY (VATS)/WEDGE RESECTION Right 10/26/2018   Procedure: VIDEO ASSISTED  THORACOSCOPY (VATS)/WEDGE RESECTION;  Surgeon: Lajuana Matte, MD;  Location: [place];  Service: Thoracic;  Laterality: Right;  Marland Kitchen VIDEO BRONCHOSCOPY N/A 10/26/2018   Procedure: VIDEO BRONCHOSCOPY;  Surgeon: Lajuana Matte, MD;  Location: [facility];  Service: Thoracic;  Laterality: N/A;     No outpatient medications have been marked as taking for the 07/27/19 encounter (Appointment) with Arnoldo Lenis, MD.     Allergies:   Patient has no known allergies.   Social History   Tobacco Use  . Smoking status: Former Smoker    Packs/day: 2.00    Years: 40.00    Pack years: 80.00    Quit date: 10/24/2015    Years since quitting: 3.7  . Smokeless tobacco: Never Used  Vaping Use  . Vaping Use: Former  . Start date: 10/23/2013  . Quit date: 10/24/2015  Substance Use Topics  . Alcohol use: No    Alcohol/week: 0.0 standard drinks  . Drug use: No     Family Hx: The patient's family history includes Cancer in her mother; Diabetes in her mother; Heart attack in her father and mother; Heart disease in her father and mother.  ROS:   Please see the history of present illness.     All other systems reviewed and are negative.   Prior CV studies:   The following studies were reviewed today:  06/2015 echo Study Conclusions  - Left ventricle: The cavity size was normal. Wall thickness was  increased in a pattern of mild concentric LVH, with moderate  focal basal septal hypertrophy. Systolic function was normal. The  estimated ejection fraction was in the range of 60% to 65%. Wall  motion was normal; there were no regional wall motion  abnormalities. Doppler parameters are consistent with abnormal  left ventricular relaxation (grade 1 diastolic dysfunction).  Indeterminate filling pressures. - Aortic valve: Mildly to moderately calcified annulus. Mildly  thickened, mildly calcified leaflets. There was mild stenosis.  Peak velocity (S): 231 cm/s. Mean gradient (S): 9 mm Hg.  Valve  area (VTI): 1.69 cm^2. Valve area (Vmean): 1.69 cm^2. - Mitral valve: Mildly calcified annulus. Normal thickness leaflets  .  06/2015 Stress MPI  Equivocal ST segment depression, 0.5 mm in leads II, III, aVF, and V4 through V6. Resolved quickly in recovery. Intermediate risk Duke treadmill score of 1.5 based on limited exercise time and nondiagnostic ST segment change.  Blood pressure demonstrated a hypertensive response to exercise.  Small, mild intensity, basal septal defect that exhibits partial reversibility, suspect related to variable soft tissue attenuation rather than ischemia.  This is a low risk study based on perfusion imaging.  Nuclear stress EF: 75%.  11/2016 cath  Mid RCA lesion, 60 %stenosed.  Prox RCA lesion, 40 %stenosed.  Ost RPDA to RPDA lesion, 90 %stenosed.  Ost Cx to Prox Cx lesion, 100 %stenosed.  Mid LAD lesion, 80 %stenosed.  Ost 2nd Diag to 2nd Diag lesion, 40 %stenosed.  Prox LAD to Mid LAD lesion, 40 %stenosed.  Ost LM to LM lesion, 99 %stenosed.  1. Severe distal left main stenosis 2. Severe mid LAD stenosis 3. 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  4. Moderately severe, calcified stenosis of the mid RCA 5. Elevated filling pressures.  11/2016 echo  Mid RCA lesion, 60 %stenosed.  Prox RCA lesion, 40 %stenosed.  Ost RPDA to RPDA lesion, 90 %stenosed.  Ost Cx to Prox Cx lesion, 100 %stenosed.  Mid LAD lesion, 80 %stenosed.  Ost 2nd Diag to 2nd Diag lesion, 40 %stenosed.  Prox LAD to Mid LAD lesion, 40 %stenosed.  Ost LM to LM lesion, 99 %stenosed.  1. Severe distal left main stenosis 2. Severe mid LAD stenosis 3. 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  4. Moderately severe, calcified stenosis of the mid RCA 5. Elevated filling pressures.  12/2016 echo Study Conclusions  - HPI and  indications: Limited for LV function & Rule out effusion. - Left ventricle: Systolic function was mildly reduced. The estimated ejection fraction was in the range of 45% to 50%. - Aortic valve: Mildly calcified leaflets. Mild to moderate stensois. Mean gradient (S): 17 mm Hg. Peak gradient (S): 28 mm Hg. Valve area (VTI): 1.01 cm^2. Valve area (Vmax): 1.1 cm^2. Valve area (Vmean): 1.02 cm^2.  Impressions:  - Compared to the recent study on 12/12/16, the LVEF is unchaged at 45-50%. There is mild to moderate aortic stenosis. No pericardial effusion is noted.  11/2017 echo Study Conclusions   - Left ventricle: The cavity size was normal. There was mild  concentric hypertrophy. Systolic function was normal. Wall  motion  was normal; there were no regional wall motion abnormalities.  Doppler parameters are consistent with abnormal left ventricular  relaxation (grade 1 diastolic dysfunction). Doppler parameters  are consistent with high ventricular filling pressure.  - Aortic valve: Valve mobility was restricted. There was mild  stenosis. There was mild regurgitation. Peak velocity (S): 288  cm/s. Mean gradient (S): 17 mm Hg. Valve area (VTI): 0.83 cm^2.  Valve area (Vmax): 0.85 cm^2. Valve area (Vmean): 0.87 cm^2.  - Mitral valve: Transvalvular velocity was within the normal range.  There was no evidence for stenosis. There was trivial  regurgitation.  - Right ventricle: The cavity size was normal. Wall thickness was  normal. Systolic function was normal.  - Atrial septum: No defect or patent foramen ovale was identified.  - Tricuspid valve: There was mild regurgitation.  - Pulmonary arteries: PA peak pressure: 27 mm Hg (S).    10/2018 nuclear stress  The left ventricular ejection fraction is normal (55-65%).  Nuclear stress EF: 56%.  There was no ST segment deviation noted during stress.  The study is normal.  This is a low risk  study.  Labs/Other Tests and Data Reviewed:    EKG:  No ECG reviewed.  Recent Labs: 10/28/2018: ALT 77; BUN 29; Creatinine, Ser 0.88; Sodium 133 10/29/2018: Potassium 4.7 07/11/2019: Hemoglobin 12.9; Platelets 204.0   Recent Lipid Panel Lab Results  Component Value Date/Time   CHOL 205 (H) 12/13/2016 10:23 AM   TRIG 101 12/13/2016 10:23 AM   HDL 68 12/13/2016 10:23 AM   CHOLHDL 3.0 12/13/2016 10:23 AM   LDLCALC 117 (H) 12/13/2016 10:23 AM    Wt Readings from Last 3 Encounters:  07/11/19 185 lb 6.4 oz (84.1 kg)  05/25/19 182 lb (82.6 kg)  03/21/19 184 lb (83.5 kg)     Objective:    Vital Signs:   Today's Vitals   07/27/19 0933  BP: 130/80  Weight: 182 lb (82.6 kg)  Height: 5\' 5"  (1.651 m)   Body mass index is 30.29 kg/m. Normal affect. Normal speech pattern and tone. Comfortable, no apparent distress. No audible signs of sob or wheezing.   ASSESSMENT & PLAN:    1. CAD -prior chest pains have resolved, recent nuclear stress without ischemia - contiue to monitor - if recurrent pains would need to consider cath  2. Carotid stenosis - touch base with vascular about the appropriate imaging for her given prior CEA but also subclavian/carotid bypass   3. Aortic stenosis - repeat echo   COVID-19 Education: The signs and symptoms of COVID-19 were discussed with the patient and how to seek care for testing (follow up with PCP or arrange E-visit).  The importance of social distancing was discussed today.  Time:   Today, I have spent 18 minutes with the patient with telehealth technology discussing the above problems.     Medication Adjustments/Labs and Tests Ordered: Current medicines are reviewed at length with the patient today.  Concerns regarding medicines are outlined above.   Tests Ordered: No orders of the defined types were placed in this encounter.   Medication Changes: No orders of the defined types were placed in this encounter.   Follow Up:   Either In Person or Virtual in 6 month(s)  Signed, Carlyle Dolly, MD  07/27/2019 8:43 AM    [place]

## 2019-07-27 NOTE — Addendum Note (Signed)
Addended by: Barbarann Ehlers A on: 07/27/2019 11:47 AM   Modules accepted: Orders

## 2019-07-27 NOTE — Addendum Note (Signed)
Addended by: Barbarann Ehlers A on: 07/27/2019 05:00 PM   Modules accepted: Orders

## 2019-08-06 ENCOUNTER — Other Ambulatory Visit: Payer: Self-pay

## 2019-08-06 NOTE — Patient Outreach (Signed)
[facility]) Care Management  08/06/2019  Emily Rocha 1955-05-23 [id number]   Telephone Assessment     Outreach attempt to patient. No answer at present.     Plan: RN CM will make quarterly outreach attempt to patient within the month of Sept.    Emily Rocha Van Management Telephonic Care Management Coordinator Direct Phone: [phone] Toll Free: [phone] Fax: [phone]'

## 2019-08-08 ENCOUNTER — Ambulatory Visit (HOSPITAL_COMMUNITY)
Admission: RE | Admit: 2019-08-08 | Discharge: 2019-08-08 | Disposition: A | Discharge status: Home / Self Care | Payer: Medicare HMO | Source: Ambulatory Visit | Attending: Cardiology | Admitting: Cardiology

## 2019-08-08 ENCOUNTER — Ambulatory Visit: Payer: Medicare HMO | Admitting: Cardiology

## 2019-08-08 ENCOUNTER — Other Ambulatory Visit: Payer: Self-pay

## 2019-08-08 DIAGNOSIS — I6523 Occlusion and stenosis of bilateral carotid arteries: Secondary | ICD-10-CM

## 2019-08-09 ENCOUNTER — Ambulatory Visit: Payer: Self-pay

## 2019-08-10 ENCOUNTER — Ambulatory Visit (HOSPITAL_COMMUNITY)
Admission: RE | Admit: 2019-08-10 | Discharge: 2019-08-10 | Disposition: A | Discharge status: Home / Self Care | Payer: Medicare HMO | Source: Ambulatory Visit | Attending: Cardiology | Admitting: Cardiology

## 2019-08-10 ENCOUNTER — Other Ambulatory Visit: Payer: Self-pay

## 2019-08-10 ENCOUNTER — Telehealth: Payer: Self-pay | Admitting: *Deleted

## 2019-08-10 DIAGNOSIS — Z85118 Personal history of other malignant neoplasm of bronchus and lung: Secondary | ICD-10-CM | POA: Insufficient documentation

## 2019-08-10 DIAGNOSIS — I6523 Occlusion and stenosis of bilateral carotid arteries: Secondary | ICD-10-CM | POA: Diagnosis not present

## 2019-08-10 DIAGNOSIS — I083 Combined rheumatic disorders of mitral, aortic and tricuspid valves: Secondary | ICD-10-CM | POA: Diagnosis not present

## 2019-08-10 DIAGNOSIS — I252 Old myocardial infarction: Secondary | ICD-10-CM | POA: Diagnosis not present

## 2019-08-10 DIAGNOSIS — J449 Chronic obstructive pulmonary disease, unspecified: Secondary | ICD-10-CM | POA: Diagnosis not present

## 2019-08-10 DIAGNOSIS — Z951 Presence of aortocoronary bypass graft: Secondary | ICD-10-CM | POA: Insufficient documentation

## 2019-08-10 DIAGNOSIS — Z902 Acquired absence of lung [part of]: Secondary | ICD-10-CM | POA: Insufficient documentation

## 2019-08-10 NOTE — Progress Notes (Signed)
*  PRELIMINARY RESULTS* Echocardiogram 2D Echocardiogram has been performed.  Emily Rocha 08/10/2019, 11:22 AM

## 2019-08-10 NOTE — Telephone Encounter (Signed)
Pt aware - routed to pcp  

## 2019-08-10 NOTE — Telephone Encounter (Signed)
-----   Message from Arnoldo Lenis, MD sent at 08/10/2019 10:10 AM EDT ----- Carotid US looks good   Zandra Abts MD

## 2019-08-21 ENCOUNTER — Telehealth: Payer: Self-pay | Admitting: Cardiology

## 2019-08-21 NOTE — Telephone Encounter (Signed)
Would like scan results   Please call [phone]   Thanks renee

## 2019-08-21 NOTE — Telephone Encounter (Signed)
Please result echo

## 2019-08-21 NOTE — Telephone Encounter (Signed)
Just resulted, please see result note   Zandra Abts MD

## 2019-08-21 NOTE — Telephone Encounter (Signed)
Patient notified of echo results, copied pcp

## 2019-09-06 DIAGNOSIS — F419 Anxiety disorder, unspecified: Secondary | ICD-10-CM | POA: Diagnosis not present

## 2019-09-06 DIAGNOSIS — J439 Emphysema, unspecified: Secondary | ICD-10-CM | POA: Diagnosis not present

## 2019-09-06 DIAGNOSIS — Z299 Encounter for prophylactic measures, unspecified: Secondary | ICD-10-CM | POA: Diagnosis not present

## 2019-09-14 DIAGNOSIS — Z6832 Body mass index (BMI) 32.0-32.9, adult: Secondary | ICD-10-CM | POA: Diagnosis not present

## 2019-09-14 DIAGNOSIS — Z79899 Other long term (current) drug therapy: Secondary | ICD-10-CM | POA: Diagnosis not present

## 2019-09-14 DIAGNOSIS — Z7189 Other specified counseling: Secondary | ICD-10-CM | POA: Diagnosis not present

## 2019-09-14 DIAGNOSIS — E7800 Pure hypercholesterolemia, unspecified: Secondary | ICD-10-CM | POA: Diagnosis not present

## 2019-09-14 DIAGNOSIS — F419 Anxiety disorder, unspecified: Secondary | ICD-10-CM | POA: Diagnosis not present

## 2019-09-14 DIAGNOSIS — Z299 Encounter for prophylactic measures, unspecified: Secondary | ICD-10-CM | POA: Diagnosis not present

## 2019-09-14 DIAGNOSIS — Z1331 Encounter for screening for depression: Secondary | ICD-10-CM | POA: Diagnosis not present

## 2019-09-14 DIAGNOSIS — Z1339 Encounter for screening examination for other mental health and behavioral disorders: Secondary | ICD-10-CM | POA: Diagnosis not present

## 2019-09-14 DIAGNOSIS — Z0000 Encounter for general adult medical examination without abnormal findings: Secondary | ICD-10-CM | POA: Diagnosis not present

## 2019-09-14 DIAGNOSIS — I4891 Unspecified atrial fibrillation: Secondary | ICD-10-CM | POA: Diagnosis not present

## 2019-10-01 ENCOUNTER — Encounter (HOSPITAL_COMMUNITY)
Admission: RE | Admit: 2019-10-01 | Discharge: 2019-10-01 | Disposition: A | Discharge status: Home / Self Care | Payer: Medicare HMO | Source: Ambulatory Visit | Attending: Pulmonary Disease | Admitting: Pulmonary Disease

## 2019-10-01 ENCOUNTER — Other Ambulatory Visit: Payer: Self-pay

## 2019-10-01 ENCOUNTER — Encounter (HOSPITAL_COMMUNITY): Payer: Self-pay

## 2019-10-01 VITALS — BP 108/60 | HR 71 | Ht 65.0 in | Wt 189.0 lb

## 2019-10-01 DIAGNOSIS — J449 Chronic obstructive pulmonary disease, unspecified: Secondary | ICD-10-CM | POA: Diagnosis not present

## 2019-10-01 NOTE — Progress Notes (Signed)
Cardiac/Pulmonary Rehab Medication Review by a Pharmacist  Does the patient  feel that his/her medications are working for him/her?  yes  Has the patient been experiencing any side effects to the medications prescribed?  yes  Does the patient measure his/her own blood pressure or blood glucose at home?  yes   Does the patient have any problems obtaining medications due to transportation or finances?   no  Understanding of regimen: excellent Understanding of indications: excellent Potential of compliance: excellent  Questions asked to Determine Patient Understanding of Medication Regimen:  1. What is the name of the medication?  2. What is the medication used for?  3. When should it be taken?  4. How much should be taken?  5. How will you take it?  6. What side effects should you report?  Understanding Defined as: Excellent: All questions above are correct Good: Questions 1-4 are correct Fair: Questions 1-2 are correct  Poor: 1 or none of the above questions are correct   Pharmacist comments:  Added Vitamin C, Vitamin D and calcium to patient's med list.    Despina Pole 10/01/2019 8:58 AM

## 2019-10-01 NOTE — Progress Notes (Signed)
Pulmonary Individual Treatment Plan  Patient Details  Name: Emily Rocha MRN: [id number] Date of Birth: October 12, 1955 Referring Provider:     PULMONARY REHAB OTHER RESP ORIENTATION from 10/01/2019 in [facility]  Referring Provider Dr. Vaughan Browner      Initial Encounter Date:    [place] from 10/01/2019 in [facility]  Date 10/01/19      Visit Diagnosis: Chronic obstructive pulmonary disease, unspecified COPD type ([place])  Patient's Home Medications on Admission:   Current Outpatient Medications:  .  albuterol (PROVENTIL) (2.5 MG/3ML) 0.083% nebulizer solution, Take 2.5 mg by nebulization every 4 (four) hours as needed., Disp: , Rfl:  .  ALPRAZolam (XANAX) 0.5 MG tablet, Take 0.5 mg by mouth 3 (three) times daily. , Disp: , Rfl:  .  Ascorbic Acid (VITAMIN C GUMMIE) 120 MG CHEW, Chew 500 mg by mouth., Disp: , Rfl:  .  aspirin 81 MG chewable tablet, Chew 81 mg by mouth daily., Disp: , Rfl:  .  atorvastatin (LIPITOR) 40 MG tablet, Take 1 tablet (40 mg total) daily by mouth., Disp: 30 tablet, Rfl: 1 .  calcium carbonate (OS-CAL) 1250 (500 Ca) MG chewable tablet, Chew 1 tablet by mouth daily., Disp: , Rfl:  .  cholecalciferol (VITAMIN D3) 25 MCG (1000 UNIT) tablet, Take 1,000 Units by mouth daily., Disp: , Rfl:  .  diphenhydramine-acetaminophen (TYLENOL PM) 25-500 MG TABS tablet, Take 2-3 tablets by mouth at bedtime., Disp: , Rfl:  .  isosorbide mononitrate (IMDUR) 30 MG 24 hr tablet, Take 0.5 tablets (15 mg total) by mouth daily., Disp: 45 tablet, Rfl: 3 .  metoprolol succinate (TOPROL-XL) 50 MG 24 hr tablet, Take 50 mg by mouth daily. Take with or immediately following a meal., Disp: , Rfl:  .  Multiple Vitamin (MULTIVITAMIN WITH MINERALS) TABS tablet, Take 1 tablet by mouth daily., Disp:  , Rfl:  .  pantoprazole (PROTONIX) 40 MG tablet, Take 40 mg by mouth daily., Disp: , Rfl:  .  potassium chloride SA (K-DUR,KLOR-CON) 20  MEQ tablet, Take 20 mEq by mouth daily. , Disp: , Rfl: 2 .  Tiotropium Bromide-Olodaterol (STIOLTO RESPIMAT) 2.5-2.5 MCG/ACT AERS, Inhale 2 puffs into the lungs daily., Disp: 4 g, Rfl: 4 .  torsemide (DEMADEX) 20 MG tablet, Take 1 tablet (20 mg total) by mouth daily., Disp: , Rfl:   Past Medical History: Past Medical History:  Diagnosis Date  . Anxiety   . Asthma   . COPD (chronic obstructive pulmonary disease) ([place])   . Coronary artery disease    hx CABG  . GERD (gastroesophageal reflux disease)   . Peripheral vascular disease ([place])   . Pneumonia   . Shortness of breath dyspnea   . Stroke [facility])    "mini stroke" numbness and weakness to left side of the body    Tobacco Use: Social History   Tobacco Use  Smoking Status Former Smoker  . Packs/day: 2.00  . Years: 40.00  . Pack years: 80.00  . Quit date: 10/24/2015  . Years since quitting: 3.9  Smokeless Tobacco Never Used    Labs: Recent Review Flowsheet Data    Labs for ITP Cardiac and Pulmonary Rehab Latest Ref Rng & Units 12/28/2017 12/28/2017 10/26/2018 10/26/2018 10/27/2018   Cholestrol 0 - 200 mg/dL - - - - -   LDLCALC 0 - 99 mg/dL - - - - -   HDL >40 mg/dL - - - - -   Trlycerides <150 mg/dL - - - - -  Hemoglobin A1c 4.8 - 5.6 % - - - - -   PHART 7.35 - 7.45 - 7.362 7.311(L) 7.280(L) 7.409   PCO2ART 32 - 48 mmHg - 52.3(H) 60.0(H) 59.1(H) 40.3   HCO3 20.0 - 28.0 mmol/L - 29.6(H) 29.4(H) 28.2(H) 25.0   TCO2 22 - 32 mmol/L - 31 - 30 -   O2SAT % 74.3 92.0 96.6 100.0 88.6      Capillary Blood Glucose: Lab Results  Component Value Date   GLUCAP 98 10/31/2018   GLUCAP 93 08/31/2018   GLUCAP 140 (H) 01/13/2018   GLUCAP 198 (H) 01/12/2018   GLUCAP 105 (H) 01/12/2018     Pulmonary Assessment Scores:  Pulmonary Assessment Scores    Row Name 10/01/19 0950         ADL UCSD   ADL Phase Entry     SOB Score total 49     Rest 0     Walk 3     Stairs 4     Bath 0     Dress 1     Shop 3       CAT Score   CAT  Score 15       mMRC Score   mMRC Score 2           UCSD: Self-administered rating of dyspnea associated with activities of daily living (ADLs) 6-point scale (0 = "not at all" to 5 = "maximal or unable to do because of breathlessness")  Scoring Scores range from 0 to 120.  Minimally important difference is 5 units  CAT: CAT can identify the health impairment of COPD patients and is better correlated with disease progression.  CAT has a scoring range of zero to 40. The CAT score is classified into four groups of low (less than 10), medium (10 - 20), high (21-30) and very high (31-40) based on the impact level of disease on health status. A CAT score over 10 suggests significant symptoms.  A worsening CAT score could be explained by an exacerbation, poor medication adherence, poor inhaler technique, or progression of COPD or comorbid conditions.  CAT MCID is 2 points  mMRC: mMRC (Modified Medical Research Council) Dyspnea Scale is used to assess the degree of baseline functional disability in patients of respiratory disease due to dyspnea. No minimal important difference is established. A decrease in score of 1 point or greater is considered a positive change.   Pulmonary Function Assessment:  Pulmonary Function Assessment - 10/01/19 1004      Initial Spirometry Results   FVC% 50 %    FEV1% 43 %    FEV1/FVC Ratio 85      Post Bronchodilator Spirometry Results   FVC% 51 %    FEV1% 42 %    FEV1/FVC Ratio 82      Breath   Bilateral Breath Sounds Clear;Decreased    Shortness of Breath Yes;Limiting activity           Exercise Target Goals: Exercise Program Goal: Individual exercise prescription set using results from initial 6 min walk test and THRR while considering  patient's activity barriers and safety.   Exercise Prescription Goal: Initial exercise prescription builds to 30-45 minutes a day of aerobic activity, 2-3 days per week.  Home exercise guidelines will be given to  patient during program as part of exercise prescription that the participant will acknowledge.  Activity Barriers & Risk Stratification:   6 Minute Walk:  6 Minute Walk    Row Name 10/01/19 1157  6 Minute Walk   Distance 1150 feet     Distance % Change 0 %     Distance Feet Change 0 ft     Walk Time 6 minutes     # of Rest Breaks 0     MPH 2.18     METS 2.65     RPE 13     Perceived Dyspnea  13     VO2 Peak 9.27     Symptoms Yes (comment)     Comments SpO2 dropped to 85%, and rose back to 90% after about 15 seconds as I was going to get O2. It did not drop again during the remainder of the test.     Resting HR 71 bpm     Resting BP 108/60     Resting Oxygen Saturation  92 %     Exercise Oxygen Saturation  during 6 min walk 85 %     Max Ex. HR 96 bpm     Max Ex. BP 142/74     2 Minute Post BP 120/74       Interval HR   1 Minute HR 83     2 Minute HR 91     3 Minute HR 81     4 Minute HR 89     5 Minute HR 90     6 Minute HR 96     2 Minute Post HR 77     Interval Heart Rate? Yes       Interval Oxygen   Interval Oxygen? Yes     Baseline Oxygen Saturation % 92 %     1 Minute Oxygen Saturation % 91 %     1 Minute Liters of Oxygen 0 L     2 Minute Oxygen Saturation % 85 %     2 Minute Liters of Oxygen 0 L     3 Minute Oxygen Saturation % 90 %     3 Minute Liters of Oxygen 0 L     4 Minute Oxygen Saturation % 91 %     4 Minute Liters of Oxygen 0 L     5 Minute Oxygen Saturation % 90 %     5 Minute Liters of Oxygen 0 L     6 Minute Oxygen Saturation % 90 %     6 Minute Liters of Oxygen 0 L     2 Minute Post Oxygen Saturation % 92 %     2 Minute Post Liters of Oxygen 0 L            Oxygen Initial Assessment:  Oxygen Initial Assessment - 10/01/19 0951      Home Oxygen   Home Oxygen Device None    Sleep Oxygen Prescription None    Home Exercise Oxygen Prescription None    Home at Rest Exercise Oxygen Prescription None      Initial 6 min Walk    Oxygen Used None      Program Oxygen Prescription   Program Oxygen Prescription None      Intervention   Short Term Goals To learn and exhibit compliance with exercise, home and travel O2 prescription;To learn and understand importance of monitoring SPO2 with pulse oximeter and demonstrate accurate use of the pulse oximeter.;To learn and understand importance of maintaining oxygen saturations>88%;To learn and demonstrate proper pursed lip breathing techniques or other breathing techniques.    Long  Term Goals Exhibits compliance with exercise, home and travel O2 prescription;Verbalizes importance  of monitoring SPO2 with pulse oximeter and return demonstration;Maintenance of O2 saturations>88%;Exhibits proper breathing techniques, such as pursed lip breathing or other method taught during program session           Oxygen Re-Evaluation:   Oxygen Discharge (Final Oxygen Re-Evaluation):   Initial Exercise Prescription:  Initial Exercise Prescription - 10/01/19 1200      Date of Initial Exercise RX and Referring Provider   Date 10/01/19    Referring Provider Dr. Vaughan Browner    Expected Discharge Date 01/01/20      NuStep   Level 1    SPM 80    Minutes 22      Arm Ergometer   Level 1    RPM 60    Minutes 17      Prescription Details   Frequency (times per week) 2    Duration Progress to 30 minutes of continuous aerobic without signs/symptoms of physical distress      Intensity   THRR 40-80% of Max Heartrate 62-123    Ratings of Perceived Exertion 11-13    Perceived Dyspnea 0-4      Progression   Progression Continue progressive overload as per policy without signs/symptoms or physical distress.      Resistance Training   Training Prescription Yes    Weight 1 lbs    Reps 10-15           Perform Capillary Blood Glucose checks as needed.  Exercise Prescription Changes:   Exercise Comments:   Exercise Goals and Review:   Exercise Goals    Row Name 10/01/19 1203              Exercise Goals   Increase Physical Activity Yes       Intervention Provide advice, education, support and counseling about physical activity/exercise needs.;Develop an individualized exercise prescription for aerobic and resistive training based on initial evaluation findings, risk stratification, comorbidities and participant's personal goals.       Expected Outcomes Short Term: Attend rehab on a regular basis to increase amount of physical activity.;Long Term: Add in home exercise to make exercise part of routine and to increase amount of physical activity.;Long Term: Exercising regularly at least 3-5 days a week.       Increase Strength and Stamina Yes       Intervention Provide advice, education, support and counseling about physical activity/exercise needs.;Develop an individualized exercise prescription for aerobic and resistive training based on initial evaluation findings, risk stratification, comorbidities and participant's personal goals.       Expected Outcomes Short Term: Increase workloads from initial exercise prescription for resistance, speed, and METs.;Short Term: Perform resistance training exercises routinely during rehab and add in resistance training at home;Long Term: Improve cardiorespiratory fitness, muscular endurance and strength as measured by increased METs and functional capacity (6MWT)       Able to understand and use rate of perceived exertion (RPE) scale Yes       Intervention Provide education and explanation on how to use RPE scale       Expected Outcomes Short Term: Able to use RPE daily in rehab to express subjective intensity level;Long Term:  Able to use RPE to guide intensity level when exercising independently       Able to understand and use Dyspnea scale Yes       Intervention Provide education and explanation on how to use Dyspnea scale       Expected Outcomes Short Term: Able to use Dyspnea scale daily  in rehab to express subjective sense of  shortness of breath during exertion;Long Term: Able to use Dyspnea scale to guide intensity level when exercising independently       Knowledge and understanding of Target Heart Rate Range (THRR) Yes       Intervention Provide education and explanation of THRR including how the numbers were predicted and where they are located for reference       Expected Outcomes Short Term: Able to state/look up THRR;Long Term: Able to use THRR to govern intensity when exercising independently;Short Term: Able to use daily as guideline for intensity in rehab       Understanding of Exercise Prescription Yes       Intervention Provide education, explanation, and written materials on patient's individual exercise prescription       Expected Outcomes Long Term: Able to explain home exercise prescription to exercise independently;Short Term: Able to explain program exercise prescription              Exercise Goals Re-Evaluation :   Discharge Exercise Prescription (Final Exercise Prescription Changes):   Nutrition:  Target Goals: Understanding of nutrition guidelines, daily intake of sodium 1500mg , cholesterol 200mg , calories 30% from fat and 7% or less from saturated fats, daily to have 5 or more servings of fruits and vegetables.  Biometrics:  Pre Biometrics - 10/01/19 1200      Pre Biometrics   Waist Circumference 47.5 inches    Hip Circumference 48 inches    Waist to Hip Ratio 0.99 %    Triceps Skinfold 21 mm    % Body Fat 33.7 %    Grip Strength 20.1 kg    Flexibility 20 in    Single Leg Stand 2.1 seconds            Nutrition Therapy Plan and Nutrition Goals:  Nutrition Therapy & Goals - 10/01/19 0954      Personal Nutrition Goals   Comments Patient scored 59 on her medficts diet assessment. We went over the areas she scored the highest in. She says she knows she needs to eat better and less and wants to work on this. Gave her a guide for healthier shopping and meal prep choices. Will  continue to monitor.      Intervention Plan   Intervention Nutrition handout(s) given to patient.           Nutrition Assessments:  Nutrition Assessments - 10/01/19 0952      MEDFICTS Scores   Pre Score 59           Nutrition Goals Re-Evaluation:   Nutrition Goals Discharge (Final Nutrition Goals Re-Evaluation):   Psychosocial: Target Goals: Acknowledge presence or absence of significant depression and/or stress, maximize coping skills, provide positive support system. Participant is able to verbalize types and ability to use techniques and skills needed for reducing stress and depression.  Initial Review & Psychosocial Screening:  Initial Psych Review & Screening - 10/01/19 0955      Initial Review   Current issues with Current Anxiety/Panic      Family Dynamics   Good Support System? Yes    Comments Emily Rocha has great family support. She lives with her husband whom she says is very supportative. She has a lot of friends and a church family that she says is also very supporative. She says her faith is very strong. She takes Alprazolam 0.5 mg TID to manage her anxiety which she says is controlled. Will continue to monitor.  Barriers   Psychosocial barriers to participate in program The patient should benefit from training in stress management and relaxation.      Screening Interventions   Interventions Encouraged to exercise    Expected Outcomes Short Term goal: Identification and review with participant of any Quality of Life or Depression concerns found by scoring the questionnaire.;Long Term goal: The participant improves quality of Life and PHQ9 Scores as seen by post scores and/or verbalization of changes           Quality of Life Scores:  Quality of Life - 10/01/19 1137      Quality of Life   Select Quality of Life      Quality of Life Scores   Health/Function Pre 28.78 %    Socioeconomic Pre 30 %    Psych/Spiritual Pre 30 %    Family Pre 30 %     GLOBAL Pre 29.44 %          Scores of 19 and below usually indicate a poorer quality of life in these areas.  A difference of  2-3 points is a clinically meaningful difference.  A difference of 2-3 points in the total score of the Quality of Life Index has been associated with significant improvement in overall quality of life, self-image, physical symptoms, and general health in studies assessing change in quality of life.   PHQ-9: Recent Review Flowsheet Data    Depression screen [facility] 2/9 10/01/2019 11/07/2018 01/31/2017   Decreased Interest 0 0 0   Down, Depressed, Hopeless 0 0 0   PHQ - 2 Score 0 0 0   Altered sleeping 0 - 0   Tired, decreased energy 0 - 2   Change in appetite 0 - 0   Feeling bad or failure about yourself  0 - 0   Trouble concentrating 0 - 0   Moving slowly or fidgety/restless 0 - 0   Suicidal thoughts 0 - 0   PHQ-9 Score 0 - 2   Difficult doing work/chores Not difficult at all - Somewhat difficult     Interpretation of Total Score  Total Score Depression Severity:  1-4 = Minimal depression, 5-9 = Mild depression, 10-14 = Moderate depression, 15-19 = Moderately severe depression, 20-27 = Severe depression   Psychosocial Evaluation and Intervention:  Psychosocial Evaluation - 10/01/19 1225      Psychosocial Evaluation & Interventions   Comments Patient's initial QOL score was 29.44  and her PHQ-9 score was 0. She has anxiety which is treated with Alprazolam 0.5 mg tid.           Psychosocial Re-Evaluation:   Psychosocial Discharge (Final Psychosocial Re-Evaluation):    Education: Education Goals: Education classes will be provided on a weekly basis, covering required topics. Participant will state understanding/return demonstration of topics presented.  Learning Barriers/Preferences:  Learning Barriers/Preferences - 10/01/19 0953      Learning Barriers/Preferences   Learning Barriers None    Learning Preferences Written Material            Education Topics: How Lungs Work and Diseases: - Discuss the anatomy of the lungs and diseases that can affect the lungs, such as COPD.   Exercise: -Discuss the importance of exercise, FITT principles of exercise, normal and abnormal responses to exercise, and how to exercise safely.   Environmental Irritants: -Discuss types of environmental irritants and how to limit exposure to environmental irritants.   Meds/Inhalers and oxygen: - Discuss respiratory medications, definition of an inhaler and oxygen, and  the proper way to use an inhaler and oxygen.   Energy Saving Techniques: - Discuss methods to conserve energy and decrease shortness of breath when performing activities of daily living.    Bronchial Hygiene / Breathing Techniques: - Discuss breathing mechanics, pursed-lip breathing technique,  proper posture, effective ways to clear airways, and other functional breathing techniques   Cleaning Equipment: - Provides group verbal and written instruction about the health risks of elevated stress, cause of high stress, and healthy ways to reduce stress.   Nutrition I: Fats: - Discuss the types of cholesterol, what cholesterol does to the body, and how cholesterol levels can be controlled.   CARDIAC REHAB PHASE II EXERCISE from 02/23/2017 in [place]  Date 02/09/17  Educator [place]  Instruction Review Code 2- Demonstrated Understanding      Nutrition II: Labels: -Discuss the different components of food labels and how to read food labels.   CARDIAC REHAB PHASE II EXERCISE from 02/23/2017 in [place]  Date 02/16/17  Educator [place]  Instruction Review Code 2- Demonstrated Understanding      Respiratory Infections: - Discuss the signs and symptoms of respiratory infections, ways to prevent respiratory infections, and the importance of seeking medical treatment when having a respiratory infection.   Stress I: Signs and  Symptoms: - Discuss the causes of stress, how stress may lead to anxiety and depression, and ways to limit stress.   Stress II: Relaxation: -Discuss relaxation techniques to limit stress.   Oxygen for Home/Travel: - Discuss how to prepare for travel when on oxygen and proper ways to transport and store oxygen to ensure safety.   Knowledge Questionnaire Score:  Knowledge Questionnaire Score - 10/01/19 0953      Knowledge Questionnaire Score   Pre Score 16/18           Core Components/Risk Factors/Patient Goals at Admission:  Personal Goals and Risk Factors at Admission - 10/01/19 1001      Core Components/Risk Factors/Patient Goals on Admission    Weight Management Obesity    Improve shortness of breath with ADL's Yes    Intervention Provide education, individualized exercise plan and daily activity instruction to help decrease symptoms of SOB with activities of daily living.    Expected Outcomes Short Term: Improve cardiorespiratory fitness to achieve a reduction of symptoms when performing ADLs;Long Term: Be able to perform more ADLs without symptoms or delay the onset of symptoms    Personal Goal Other Yes    Personal Goal Build stamina and improve lung capacity to breathe better. Lose 10 lbs.    Intervention Patient will attend PR 2 days/week and supplement with exercise at home 3 days/week.    Expected Outcomes Patient will meet both program and personal gaols.           Core Components/Risk Factors/Patient Goals Review:    Core Components/Risk Factors/Patient Goals at Discharge (Final Review):    ITP Comments:   Comments: Patient arrived for 1st visit/orientation/education at 0800. Patient was referred to PR by Mannam due to COPD (J44.9). During orientation advised patient on arrival and appointment times what to wear, what to do before, during and after exercise. Reviewed attendance and class policy. Talked about inclement weather and class consultation policy.  Pt is scheduled to return Cardiac Rehab on 10/04/19 at 10:45. Pt was advised to come to class 15 minutes before class starts. Patient was also given instructions on meeting with the dietician and attending the Family Structure classes.  Discussed RPE/Dpysnea scales. Discussed initial THR and how to find their radial and/or carotid pulse. Discussed the initial exercise prescription and how this effects their progress. Pt is eager to get started. Patient participated in warm up stretches followed by light weights and resistance bands. Patient was able to complete 6 minute walk test.  Patient was measured for the equipment. Discussed equipment safety with patient. Took patient pre-anthropometric measurements. Patient finished visit at 9:45.

## 2019-10-01 NOTE — Progress Notes (Signed)
Daily Session Note  Patient Details  Name: Emily Rocha MRN: [id number] Date of Birth: 05/30/1955 Referring Provider:     PULMONARY REHAB OTHER RESP ORIENTATION from 10/01/2019 in [place] CARDIAC REHABILITATION  Referring Provider Dr. Mannam      Encounter Date: 10/01/2019  Check In:  Session Check In - 10/01/19 0800      Check-In   Supervising physician immediately available to respond to emergencies See telemetry face sheet for immediately available ER MD    Location [facility]    Staff Present Vaniece Hatchett, Exercise Physiologist; , RN, BSN    Virtual Visit No    Medication changes reported     No    Fall or balance concerns reported    No    Tobacco Cessation No Change    Current number of cigarettes/nicotine per day     --   Patient quit smoking 10/2015 with an 80 pack year history.   Warm-up and Cool-down --   Performed as one-on-one   Resistance Training Performed Yes    VAD Patient? No    PAD/SET Patient? No      Pain Assessment   Currently in Pain? No/denies    Multiple Pain Sites No           Capillary Blood Glucose: No results found for this or any previous visit (from the past 24 hour(s)).    Social History   Tobacco Use  Smoking Status Former Smoker  . Packs/day: 2.00  . Years: 40.00  . Pack years: 80.00  . Quit date: 10/24/2015  . Years since quitting: 3.9  Smokeless Tobacco Never Used    Goals Met:  Proper associated with RPD/PD & O2 Sat Exercise tolerated well Personal goals reviewed No report of cardiac concerns or symptoms Strength training completed today  Goals Unmet:  Not Applicable  Comments: Patient's orientation visit today. [id number].   Dr. Jehanzeb Memon is Medical Director for [facility]. 

## 2019-10-04 ENCOUNTER — Encounter (HOSPITAL_COMMUNITY): Payer: Self-pay

## 2019-10-04 ENCOUNTER — Encounter (HOSPITAL_COMMUNITY): Payer: Medicare HMO

## 2019-10-09 ENCOUNTER — Encounter (HOSPITAL_COMMUNITY): Payer: Medicare HMO

## 2019-10-11 ENCOUNTER — Other Ambulatory Visit: Payer: Self-pay

## 2019-10-11 ENCOUNTER — Encounter (HOSPITAL_COMMUNITY)
Admission: RE | Admit: 2019-10-11 | Discharge: 2019-10-11 | Disposition: A | Discharge status: Home / Self Care | Payer: Medicare HMO | Source: Ambulatory Visit | Attending: Pulmonary Disease | Admitting: Pulmonary Disease

## 2019-10-11 DIAGNOSIS — J449 Chronic obstructive pulmonary disease, unspecified: Secondary | ICD-10-CM

## 2019-10-11 NOTE — Progress Notes (Signed)
Daily Session Note  Patient Details  Name: Emily Rocha MRN: [id number] Date of Birth: [birth year] Referring Provider:     PULMONARY REHAB OTHER RESP ORIENTATION from 10/01/2019 in [place]  Referring Provider Dr. Vaughan Browner      Encounter Date: 10/11/2019  Check In:   Capillary Blood Glucose: No results found for this or any previous visit (from the past 24 hour(s)).    Social History   Tobacco Use  Smoking Status Former Smoker  . Packs/day: 2.00  . Years: 40.00  . Pack years: 80.00  . Quit date: 10/24/2015  . Years since quitting: 3.9  Smokeless Tobacco Never Used    Goals Met:  Proper associated with RPD/PD & O2 Sat Independence with exercise equipment Improved SOB with ADL's Using PLB without cueing & demonstrates good technique Exercise tolerated well No report of cardiac concerns or symptoms Strength training completed today  Goals Unmet:  Not Applicable  Comments: [id number]   Dr. Kathie Dike is Medical Director for [facility].

## 2019-10-15 ENCOUNTER — Other Ambulatory Visit: Payer: Self-pay | Admitting: Thoracic Surgery (Cardiothoracic Vascular Surgery)

## 2019-10-15 DIAGNOSIS — C3490 Malignant neoplasm of unspecified part of unspecified bronchus or lung: Secondary | ICD-10-CM

## 2019-10-16 ENCOUNTER — Encounter (HOSPITAL_COMMUNITY)
Admission: RE | Admit: 2019-10-16 | Discharge: 2019-10-16 | Disposition: A | Discharge status: Home / Self Care | Payer: Medicare HMO | Source: Ambulatory Visit | Attending: Pulmonary Disease | Admitting: Pulmonary Disease

## 2019-10-16 ENCOUNTER — Other Ambulatory Visit: Payer: Self-pay

## 2019-10-16 DIAGNOSIS — J449 Chronic obstructive pulmonary disease, unspecified: Secondary | ICD-10-CM | POA: Diagnosis not present

## 2019-10-16 NOTE — Progress Notes (Signed)
Daily Session Note  Patient Details  Name: SHEALYNN SAULNIER MRN: [id number] Date of Birth: 1955/08/05 Referring Provider:     PULMONARY REHAB OTHER RESP ORIENTATION from 10/01/2019 in [place]  Referring Provider Dr. Vaughan Browner      Encounter Date: 10/16/2019  Check In:  Session Check In - 10/16/19 1100      Check-In   Supervising physician immediately available to respond to emergencies See telemetry face sheet for immediately available MD    Location [facility]    Staff Present Geanie Cooley, Felipe Drone, RN, [facility]    Virtual Visit No    Medication changes reported     No    Fall or balance concerns reported    No    Tobacco Cessation No Change    Warm-up and Cool-down Performed as group-led instruction    Resistance Training Performed Yes    VAD Patient? No    PAD/SET Patient? No      Pain Assessment   Currently in Pain? No/denies    Pain Score 0-No pain    Multiple Pain Sites No           Capillary Blood Glucose: No results found for this or any previous visit (from the past 24 hour(s)).    Social History   Tobacco Use  Smoking Status Former Smoker  . Packs/day: 2.00  . Years: 40.00  . Pack years: 80.00  . Quit date: 10/24/2015  . Years since quitting: 3.9  Smokeless Tobacco Never Used    Goals Met:  Proper associated with RPD/PD & O2 Sat Independence with exercise equipment Improved SOB with ADL's Using PLB without cueing & demonstrates good technique Exercise tolerated well Personal goals reviewed No report of cardiac concerns or symptoms Strength training completed today  Goals Unmet:  Not Applicable  Comments: check out 12:00   Dr. Kathie Dike is Medical Director for [facility].

## 2019-10-18 ENCOUNTER — Other Ambulatory Visit: Payer: Self-pay

## 2019-10-18 ENCOUNTER — Encounter (HOSPITAL_COMMUNITY)
Admission: RE | Admit: 2019-10-18 | Discharge: 2019-10-18 | Disposition: A | Discharge status: Home / Self Care | Payer: Medicare HMO | Source: Ambulatory Visit | Attending: Pulmonary Disease | Admitting: Pulmonary Disease

## 2019-10-18 VITALS — Wt 189.2 lb

## 2019-10-18 DIAGNOSIS — J449 Chronic obstructive pulmonary disease, unspecified: Secondary | ICD-10-CM | POA: Diagnosis not present

## 2019-10-18 NOTE — Progress Notes (Signed)
Pulmonary Individual Treatment Plan  Patient Details  Name: Emily Rocha MRN: [id number] Date of Birth: 1955-07-11 Referring Provider:     PULMONARY REHAB OTHER RESP ORIENTATION from 10/01/2019 in [place]  Referring Provider Dr. Vaughan Browner      Initial Encounter Date:    [facility] from 10/01/2019 in [place]  Date 10/01/19      Visit Diagnosis: Chronic obstructive pulmonary disease, unspecified COPD type ([place])  Patient's Home Medications on Admission:   Current Outpatient Medications:  .  albuterol (PROVENTIL) (2.5 MG/3ML) 0.083% nebulizer solution, Take 2.5 mg by nebulization every 4 (four) hours as needed., Disp: , Rfl:  .  ALPRAZolam (XANAX) 0.5 MG tablet, Take 0.5 mg by mouth 3 (three) times daily. , Disp: , Rfl:  .  Ascorbic Acid (VITAMIN C GUMMIE) 120 MG CHEW, Chew 500 mg by mouth., Disp: , Rfl:  .  aspirin 81 MG chewable tablet, Chew 81 mg by mouth daily., Disp: , Rfl:  .  atorvastatin (LIPITOR) 40 MG tablet, Take 1 tablet (40 mg total) daily by mouth., Disp: 30 tablet, Rfl: 1 .  calcium carbonate (OS-CAL) 1250 (500 Ca) MG chewable tablet, Chew 1 tablet by mouth daily., Disp: , Rfl:  .  cholecalciferol (VITAMIN D3) 25 MCG (1000 UNIT) tablet, Take 1,000 Units by mouth daily., Disp: , Rfl:  .  diphenhydramine-acetaminophen (TYLENOL PM) 25-500 MG TABS tablet, Take 2-3 tablets by mouth at bedtime., Disp: , Rfl:  .  isosorbide mononitrate (IMDUR) 30 MG 24 hr tablet, Take 0.5 tablets (15 mg total) by mouth daily., Disp: 45 tablet, Rfl: 3 .  metoprolol succinate (TOPROL-XL) 50 MG 24 hr tablet, Take 50 mg by mouth daily. Take with or immediately following a meal., Disp: , Rfl:  .  Multiple Vitamin (MULTIVITAMIN WITH MINERALS) TABS tablet, Take 1 tablet by mouth daily., Disp:  , Rfl:  .  pantoprazole (PROTONIX) 40 MG tablet, Take 40 mg by mouth daily., Disp: , Rfl:  .  potassium chloride SA (K-DUR,KLOR-CON) 20  MEQ tablet, Take 20 mEq by mouth daily. , Disp: , Rfl: 2 .  Tiotropium Bromide-Olodaterol (STIOLTO RESPIMAT) 2.5-2.5 MCG/ACT AERS, Inhale 2 puffs into the lungs daily., Disp: 4 g, Rfl: 4 .  torsemide (DEMADEX) 20 MG tablet, Take 1 tablet (20 mg total) by mouth daily., Disp: , Rfl:   Past Medical History: Past Medical History:  Diagnosis Date  . Anxiety   . Asthma   . COPD (chronic obstructive pulmonary disease) ([place])   . Coronary artery disease    hx CABG  . GERD (gastroesophageal reflux disease)   . Peripheral vascular disease ([place])   . Pneumonia   . Shortness of breath dyspnea   . Stroke [facility])    "mini stroke" numbness and weakness to left side of the body    Tobacco Use: Social History   Tobacco Use  Smoking Status Former Smoker  . Packs/day: 2.00  . Years: 40.00  . Pack years: 80.00  . Quit date: 10/24/2015  . Years since quitting: 3.9  Smokeless Tobacco Never Used    Labs: Recent Review Flowsheet Data    Labs for ITP Cardiac and Pulmonary Rehab Latest Ref Rng & Units 12/28/2017 12/28/2017 10/26/2018 10/26/2018 10/27/2018   Cholestrol 0 - 200 mg/dL - - - - -   LDLCALC 0 - 99 mg/dL - - - - -   HDL >40 mg/dL - - - - -   Trlycerides <150 mg/dL - - - - -  Hemoglobin A1c 4.8 - 5.6 % - - - - -   PHART 7.35 - 7.45 - 7.362 7.311(L) 7.280(L) 7.409   PCO2ART 32 - 48 mmHg - 52.3(H) 60.0(H) 59.1(H) 40.3   HCO3 20.0 - 28.0 mmol/L - 29.6(H) 29.4(H) 28.2(H) 25.0   TCO2 22 - 32 mmol/L - 31 - 30 -   O2SAT % 74.3 92.0 96.6 100.0 88.6      Capillary Blood Glucose: Lab Results  Component Value Date   GLUCAP 98 10/31/2018   GLUCAP 93 08/31/2018   GLUCAP 140 (H) 01/13/2018   GLUCAP 198 (H) 01/12/2018   GLUCAP 105 (H) 01/12/2018     Pulmonary Assessment Scores:  Pulmonary Assessment Scores    Row Name 10/01/19 0950         ADL UCSD   ADL Phase Entry     SOB Score total 49     Rest 0     Walk 3     Stairs 4     Bath 0     Dress 1     Shop 3       CAT Score   CAT  Score 15       mMRC Score   mMRC Score 2           UCSD: Self-administered rating of dyspnea associated with activities of daily living (ADLs) 6-point scale (0 = "not at all" to 5 = "maximal or unable to do because of breathlessness")  Scoring Scores range from 0 to 120.  Minimally important difference is 5 units  CAT: CAT can identify the health impairment of COPD patients and is better correlated with disease progression.  CAT has a scoring range of zero to 40. The CAT score is classified into four groups of low (less than 10), medium (10 - 20), high (21-30) and very high (31-40) based on the impact level of disease on health status. A CAT score over 10 suggests significant symptoms.  A worsening CAT score could be explained by an exacerbation, poor medication adherence, poor inhaler technique, or progression of COPD or comorbid conditions.  CAT MCID is 2 points  mMRC: mMRC (Modified Medical Research Council) Dyspnea Scale is used to assess the degree of baseline functional disability in patients of respiratory disease due to dyspnea. No minimal important difference is established. A decrease in score of 1 point or greater is considered a positive change.   Pulmonary Function Assessment:  Pulmonary Function Assessment - 10/01/19 1004      Initial Spirometry Results   FVC% 50 %    FEV1% 43 %    FEV1/FVC Ratio 85      Post Bronchodilator Spirometry Results   FVC% 51 %    FEV1% 42 %    FEV1/FVC Ratio 82      Breath   Bilateral Breath Sounds Clear;Decreased    Shortness of Breath Yes;Limiting activity           Exercise Target Goals: Exercise Program Goal: Individual exercise prescription set using results from initial 6 min walk test and THRR while considering  patient's activity barriers and safety.   Exercise Prescription Goal: Initial exercise prescription builds to 30-45 minutes a day of aerobic activity, 2-3 days per week.  Home exercise guidelines will be given to  patient during program as part of exercise prescription that the participant will acknowledge.  Activity Barriers & Risk Stratification:   6 Minute Walk:  6 Minute Walk    Row Name 10/01/19 1157  6 Minute Walk   Distance 1150 feet     Distance % Change 0 %     Distance Feet Change 0 ft     Walk Time 6 minutes     # of Rest Breaks 0     MPH 2.18     METS 2.65     RPE 13     Perceived Dyspnea  13     VO2 Peak 9.27     Symptoms Yes (comment)     Comments SpO2 dropped to 85%, and rose back to 90% after about 15 seconds as I was going to get O2. It did not drop again during the remainder of the test.     Resting HR 71 bpm     Resting BP 108/60     Resting Oxygen Saturation  92 %     Exercise Oxygen Saturation  during 6 min walk 85 %     Max Ex. HR 96 bpm     Max Ex. BP 142/74     2 Minute Post BP 120/74       Interval HR   1 Minute HR 83     2 Minute HR 91     3 Minute HR 81     4 Minute HR 89     5 Minute HR 90     6 Minute HR 96     2 Minute Post HR 77     Interval Heart Rate? Yes       Interval Oxygen   Interval Oxygen? Yes     Baseline Oxygen Saturation % 92 %     1 Minute Oxygen Saturation % 91 %     1 Minute Liters of Oxygen 0 L     2 Minute Oxygen Saturation % 85 %     2 Minute Liters of Oxygen 0 L     3 Minute Oxygen Saturation % 90 %     3 Minute Liters of Oxygen 0 L     4 Minute Oxygen Saturation % 91 %     4 Minute Liters of Oxygen 0 L     5 Minute Oxygen Saturation % 90 %     5 Minute Liters of Oxygen 0 L     6 Minute Oxygen Saturation % 90 %     6 Minute Liters of Oxygen 0 L     2 Minute Post Oxygen Saturation % 92 %     2 Minute Post Liters of Oxygen 0 L            Oxygen Initial Assessment:  Oxygen Initial Assessment - 10/01/19 0951      Home Oxygen   Home Oxygen Device None    Sleep Oxygen Prescription None    Home Exercise Oxygen Prescription None    Home at Rest Exercise Oxygen Prescription None      Initial 6 min Walk    Oxygen Used None      Program Oxygen Prescription   Program Oxygen Prescription None      Intervention   Short Term Goals To learn and exhibit compliance with exercise, home and travel O2 prescription;To learn and understand importance of monitoring SPO2 with pulse oximeter and demonstrate accurate use of the pulse oximeter.;To learn and understand importance of maintaining oxygen saturations>88%;To learn and demonstrate proper pursed lip breathing techniques or other breathing techniques.    Long  Term Goals Exhibits compliance with exercise, home and travel O2 prescription;Verbalizes importance  of monitoring SPO2 with pulse oximeter and return demonstration;Maintenance of O2 saturations>88%;Exhibits proper breathing techniques, such as pursed lip breathing or other method taught during program session           Oxygen Re-Evaluation:  Oxygen Re-Evaluation    Row Name 10/17/19 0847             Program Oxygen Prescription   Program Oxygen Prescription None         Home Oxygen   Home Oxygen Device None       Sleep Oxygen Prescription None       Home Exercise Oxygen Prescription None       Home at Rest Exercise Oxygen Prescription None       Compliance with Home Oxygen Use Yes         Goals/Expected Outcomes   Short Term Goals To learn and exhibit compliance with exercise, home and travel O2 prescription;To learn and understand importance of monitoring SPO2 with pulse oximeter and demonstrate accurate use of the pulse oximeter.;To learn and understand importance of maintaining oxygen saturations>88%;To learn and demonstrate proper pursed lip breathing techniques or other breathing techniques.       Long  Term Goals Exhibits compliance with exercise, home and travel O2 prescription;Verbalizes importance of monitoring SPO2 with pulse oximeter and return demonstration;Maintenance of O2 saturations>88%;Exhibits proper breathing techniques, such as pursed lip breathing or other method  taught during program session       Goals/Expected Outcomes compliance              Oxygen Discharge (Final Oxygen Re-Evaluation):  Oxygen Re-Evaluation - 10/17/19 0847      Program Oxygen Prescription   Program Oxygen Prescription None      Home Oxygen   Home Oxygen Device None    Sleep Oxygen Prescription None    Home Exercise Oxygen Prescription None    Home at Rest Exercise Oxygen Prescription None    Compliance with Home Oxygen Use Yes      Goals/Expected Outcomes   Short Term Goals To learn and exhibit compliance with exercise, home and travel O2 prescription;To learn and understand importance of monitoring SPO2 with pulse oximeter and demonstrate accurate use of the pulse oximeter.;To learn and understand importance of maintaining oxygen saturations>88%;To learn and demonstrate proper pursed lip breathing techniques or other breathing techniques.    Long  Term Goals Exhibits compliance with exercise, home and travel O2 prescription;Verbalizes importance of monitoring SPO2 with pulse oximeter and return demonstration;Maintenance of O2 saturations>88%;Exhibits proper breathing techniques, such as pursed lip breathing or other method taught during program session    Goals/Expected Outcomes compliance           Initial Exercise Prescription:  Initial Exercise Prescription - 10/01/19 1200      Date of Initial Exercise RX and Referring Provider   Date 10/01/19    Referring Provider Dr. Vaughan Browner    Expected Discharge Date 01/01/20      NuStep   Level 1    SPM 80    Minutes 22      Arm Ergometer   Level 1    RPM 60    Minutes 17      Prescription Details   Frequency (times per week) 2    Duration Progress to 30 minutes of continuous aerobic without signs/symptoms of physical distress      Intensity   THRR 40-80% of Max Heartrate 62-123    Ratings of Perceived Exertion 11-13    Perceived Dyspnea  0-4      Progression   Progression Continue progressive overload as  per policy without signs/symptoms or physical distress.      Resistance Training   Training Prescription Yes    Weight 1 lbs    Reps 10-15           Perform Capillary Blood Glucose checks as needed.  Exercise Prescription Changes:   Exercise Comments:   Exercise Goals and Review:   Exercise Goals    Row Name 10/01/19 1203 10/17/19 0847           Exercise Goals   Increase Physical Activity Yes Yes      Intervention Provide advice, education, support and counseling about physical activity/exercise needs.;Develop an individualized exercise prescription for aerobic and resistive training based on initial evaluation findings, risk stratification, comorbidities and participant's personal goals. Provide advice, education, support and counseling about physical activity/exercise needs.;Develop an individualized exercise prescription for aerobic and resistive training based on initial evaluation findings, risk stratification, comorbidities and participant's personal goals.      Expected Outcomes Short Term: Attend rehab on a regular basis to increase amount of physical activity.;Long Term: Add in home exercise to make exercise part of routine and to increase amount of physical activity.;Long Term: Exercising regularly at least 3-5 days a week. Short Term: Attend rehab on a regular basis to increase amount of physical activity.;Long Term: Add in home exercise to make exercise part of routine and to increase amount of physical activity.;Long Term: Exercising regularly at least 3-5 days a week.      Increase Strength and Stamina Yes Yes      Intervention Provide advice, education, support and counseling about physical activity/exercise needs.;Develop an individualized exercise prescription for aerobic and resistive training based on initial evaluation findings, risk stratification, comorbidities and participant's personal goals. Provide advice, education, support and counseling about physical  activity/exercise needs.;Develop an individualized exercise prescription for aerobic and resistive training based on initial evaluation findings, risk stratification, comorbidities and participant's personal goals.      Expected Outcomes Short Term: Increase workloads from initial exercise prescription for resistance, speed, and METs.;Short Term: Perform resistance training exercises routinely during rehab and add in resistance training at home;Long Term: Improve cardiorespiratory fitness, muscular endurance and strength as measured by increased METs and functional capacity (6MWT) Short Term: Increase workloads from initial exercise prescription for resistance, speed, and METs.;Short Term: Perform resistance training exercises routinely during rehab and add in resistance training at home;Long Term: Improve cardiorespiratory fitness, muscular endurance and strength as measured by increased METs and functional capacity (6MWT)      Able to understand and use rate of perceived exertion (RPE) scale Yes Yes      Intervention Provide education and explanation on how to use RPE scale Provide education and explanation on how to use RPE scale      Expected Outcomes Short Term: Able to use RPE daily in rehab to express subjective intensity level;Long Term:  Able to use RPE to guide intensity level when exercising independently Short Term: Able to use RPE daily in rehab to express subjective intensity level;Long Term:  Able to use RPE to guide intensity level when exercising independently      Able to understand and use Dyspnea scale Yes Yes      Intervention Provide education and explanation on how to use Dyspnea scale Provide education and explanation on how to use Dyspnea scale      Expected Outcomes Short Term: Able to use Dyspnea  scale daily in rehab to express subjective sense of shortness of breath during exertion;Long Term: Able to use Dyspnea scale to guide intensity level when exercising independently Short  Term: Able to use Dyspnea scale daily in rehab to express subjective sense of shortness of breath during exertion;Long Term: Able to use Dyspnea scale to guide intensity level when exercising independently      Knowledge and understanding of Target Heart Rate Range (THRR) Yes Yes      Intervention Provide education and explanation of THRR including how the numbers were predicted and where they are located for reference Provide education and explanation of THRR including how the numbers were predicted and where they are located for reference      Expected Outcomes Short Term: Able to state/look up THRR;Long Term: Able to use THRR to govern intensity when exercising independently;Short Term: Able to use daily as guideline for intensity in rehab Short Term: Able to state/look up THRR;Long Term: Able to use THRR to govern intensity when exercising independently;Short Term: Able to use daily as guideline for intensity in rehab      Understanding of Exercise Prescription Yes Yes      Intervention Provide education, explanation, and written materials on patient's individual exercise prescription Provide education, explanation, and written materials on patient's individual exercise prescription      Expected Outcomes Long Term: Able to explain home exercise prescription to exercise independently;Short Term: Able to explain program exercise prescription Long Term: Able to explain home exercise prescription to exercise independently;Short Term: Able to explain program exercise prescription             Exercise Goals Re-Evaluation :  Exercise Goals Re-Evaluation    Row Name 10/17/19 0848             Exercise Goal Re-Evaluation   Exercise Goals Review Increase Physical Activity;Increase Strength and Stamina;Able to understand and use rate of perceived exertion (RPE) scale;Able to understand and use Dyspnea scale;Knowledge and understanding of Target Heart Rate Range (THRR);Understanding of Exercise  Prescription       Comments Pt has attended 2 exercise sessions. Her O2 levels have remained in the low 90s on RA so far. I was unsure if she would need O2 or not when she first completed her walk test. She is currently exercising at 1.9 METs on the stepper. Will continue to monitor and progess as able.       Expected Outcomes Through exercise at rehab and by engaging in a home exercise program, the pt will reach their goals.              Discharge Exercise Prescription (Final Exercise Prescription Changes):   Nutrition:  Target Goals: Understanding of nutrition guidelines, daily intake of sodium 1500mg , cholesterol 200mg , calories 30% from fat and 7% or less from saturated fats, daily to have 5 or more servings of fruits and vegetables.  Biometrics:  Pre Biometrics - 10/01/19 1200      Pre Biometrics   Waist Circumference 47.5 inches    Hip Circumference 48 inches    Waist to Hip Ratio 0.99 %    Triceps Skinfold 21 mm    % Body Fat 33.7 %    Grip Strength 20.1 kg    Flexibility 20 in    Single Leg Stand 2.1 seconds            Nutrition Therapy Plan and Nutrition Goals:  Nutrition Therapy & Goals - 10/17/19 1456      Personal Nutrition  Goals   Comments Patient continues to work on eating healthier. Will continue to monitor.      Intervention Plan   Intervention Nutrition handout(s) given to patient.           Nutrition Assessments:  Nutrition Assessments - 10/01/19 0952      MEDFICTS Scores   Pre Score 59           Nutrition Goals Re-Evaluation:   Nutrition Goals Discharge (Final Nutrition Goals Re-Evaluation):   Psychosocial: Target Goals: Acknowledge presence or absence of significant depression and/or stress, maximize coping skills, provide positive support system. Participant is able to verbalize types and ability to use techniques and skills needed for reducing stress and depression.  Initial Review & Psychosocial Screening:  Initial Psych  Review & Screening - 10/01/19 0955      Initial Review   Current issues with Current Anxiety/Panic      Family Dynamics   Good Support System? Yes    Comments Enyah has great family support. She lives with her husband whom she says is very supportative. She has a lot of friends and a church family that she says is also very supporative. She says her faith is very strong. She takes Alprazolam 0.5 mg TID to manage her anxiety which she says is controlled. Will continue to monitor.      Barriers   Psychosocial barriers to participate in program The patient should benefit from training in stress management and relaxation.      Screening Interventions   Interventions Encouraged to exercise    Expected Outcomes Short Term goal: Identification and review with participant of any Quality of Life or Depression concerns found by scoring the questionnaire.;Long Term goal: The participant improves quality of Life and PHQ9 Scores as seen by post scores and/or verbalization of changes           Quality of Life Scores:  Quality of Life - 10/01/19 1137      Quality of Life   Select Quality of Life      Quality of Life Scores   Health/Function Pre 28.78 %    Socioeconomic Pre 30 %    Psych/Spiritual Pre 30 %    Family Pre 30 %    GLOBAL Pre 29.44 %          Scores of 19 and below usually indicate a poorer quality of life in these areas.  A difference of  2-3 points is a clinically meaningful difference.  A difference of 2-3 points in the total score of the Quality of Life Index has been associated with significant improvement in overall quality of life, self-image, physical symptoms, and general health in studies assessing change in quality of life.   PHQ-9: Recent Review Flowsheet Data    Depression screen [facility] 2/9 10/01/2019 11/07/2018 01/31/2017   Decreased Interest 0 0 0   Down, Depressed, Hopeless 0 0 0   PHQ - 2 Score 0 0 0   Altered sleeping 0 - 0   Tired, decreased energy 0 - 2    Change in appetite 0 - 0   Feeling bad or failure about yourself  0 - 0   Trouble concentrating 0 - 0   Moving slowly or fidgety/restless 0 - 0   Suicidal thoughts 0 - 0   PHQ-9 Score 0 - 2   Difficult doing work/chores Not difficult at all - Somewhat difficult     Interpretation of Total Score  Total Score Depression Severity:  1-4 = Minimal depression, 5-9 = Mild depression, 10-14 = Moderate depression, 15-19 = Moderately severe depression, 20-27 = Severe depression   Psychosocial Evaluation and Intervention:  Psychosocial Evaluation - 10/01/19 1225      Psychosocial Evaluation & Interventions   Comments Patient's initial QOL score was 29.44  and her PHQ-9 score was 0. She has anxiety which is treated with Alprazolam 0.5 mg tid.           Psychosocial Re-Evaluation:  Psychosocial Re-Evaluation    Center Name 10/17/19 1458             Psychosocial Re-Evaluation   Current issues with Current Anxiety/Panic       Comments Patient's initial QOL socre was 29.44% and her PHQ-9 was 0. She continues to be treated for anxiety with Alprazolam 0.5 mg BID. She feels her anxiety is managed with Alprazolam. Will continue to monitor.       Expected Outcomes Patient's anxiety will continue to be managed at discharge with no additional psychosocial issues identified.       Interventions Encouraged to attend Pulmonary Rehabilitation for the exercise;Relaxation education;Stress management education       Continue Psychosocial Services  No Follow up required              Psychosocial Discharge (Final Psychosocial Re-Evaluation):  Psychosocial Re-Evaluation - 10/17/19 1458      Psychosocial Re-Evaluation   Current issues with Current Anxiety/Panic    Comments Patient's initial QOL socre was 29.44% and her PHQ-9 was 0. She continues to be treated for anxiety with Alprazolam 0.5 mg BID. She feels her anxiety is managed with Alprazolam. Will continue to monitor.    Expected Outcomes Patient's  anxiety will continue to be managed at discharge with no additional psychosocial issues identified.    Interventions Encouraged to attend Pulmonary Rehabilitation for the exercise;Relaxation education;Stress management education    Continue Psychosocial Services  No Follow up required            Education: Education Goals: Education classes will be provided on a weekly basis, covering required topics. Participant will state understanding/return demonstration of topics presented.  Learning Barriers/Preferences:  Learning Barriers/Preferences - 10/01/19 0953      Learning Barriers/Preferences   Learning Barriers None    Learning Preferences Written Material           Education Topics: How Lungs Work and Diseases: - Discuss the anatomy of the lungs and diseases that can affect the lungs, such as COPD.   Exercise: -Discuss the importance of exercise, FITT principles of exercise, normal and abnormal responses to exercise, and how to exercise safely.   Environmental Irritants: -Discuss types of environmental irritants and how to limit exposure to environmental irritants.   Meds/Inhalers and oxygen: - Discuss respiratory medications, definition of an inhaler and oxygen, and the proper way to use an inhaler and oxygen.   Energy Saving Techniques: - Discuss methods to conserve energy and decrease shortness of breath when performing activities of daily living.    Bronchial Hygiene / Breathing Techniques: - Discuss breathing mechanics, pursed-lip breathing technique,  proper posture, effective ways to clear airways, and other functional breathing techniques   Cleaning Equipment: - Provides group verbal and written instruction about the health risks of elevated stress, cause of high stress, and healthy ways to reduce stress.   Nutrition I: Fats: - Discuss the types of cholesterol, what cholesterol does to the body, and how cholesterol levels can be controlled.   CARDIAC  REHAB PHASE II EXERCISE from 02/23/2017 in [place]  Date 02/09/17  Educator [place]  Instruction Review Code 2- Demonstrated Understanding      Nutrition II: Labels: -Discuss the different components of food labels and how to read food labels.   CARDIAC REHAB PHASE II EXERCISE from 02/23/2017 in [place]  Date 02/16/17  Educator [place]  Instruction Review Code 2- Demonstrated Understanding      Respiratory Infections: - Discuss the signs and symptoms of respiratory infections, ways to prevent respiratory infections, and the importance of seeking medical treatment when having a respiratory infection.   Stress I: Signs and Symptoms: - Discuss the causes of stress, how stress may lead to anxiety and depression, and ways to limit stress.   Stress II: Relaxation: -Discuss relaxation techniques to limit stress.   Oxygen for Home/Travel: - Discuss how to prepare for travel when on oxygen and proper ways to transport and store oxygen to ensure safety.   Knowledge Questionnaire Score:  Knowledge Questionnaire Score - 10/01/19 0953      Knowledge Questionnaire Score   Pre Score 16/18           Core Components/Risk Factors/Patient Goals at Admission:  Personal Goals and Risk Factors at Admission - 10/01/19 1001      Core Components/Risk Factors/Patient Goals on Admission    Weight Management Obesity    Improve shortness of breath with ADL's Yes    Intervention Provide education, individualized exercise plan and daily activity instruction to help decrease symptoms of SOB with activities of daily living.    Expected Outcomes Short Term: Improve cardiorespiratory fitness to achieve a reduction of symptoms when performing ADLs;Long Term: Be able to perform more ADLs without symptoms or delay the onset of symptoms    Personal Goal Other Yes    Personal Goal Build stamina and improve lung capacity to breathe better. Lose 10 lbs.    Intervention  Patient will attend PR 2 days/week and supplement with exercise at home 3 days/week.    Expected Outcomes Patient will meet both program and personal gaols.           Core Components/Risk Factors/Patient Goals Review:   Goals and Risk Factor Review    Row Name 10/17/19 1500             Core Components/Risk Factors/Patient Goals Review   Personal Goals Review Improve shortness of breath with ADL's;Weight Management/Obesity  Build stamina; improve lung capacity; lose 10 lbs.       Review Patient is new to the program. She has completed 3 sessions. Will continue to monitor for progress as she works toward meeting her personal goals.       Expected Outcomes Patient will complete the program meeting both personal and program goals.              Core Components/Risk Factors/Patient Goals at Discharge (Final Review):   Goals and Risk Factor Review - 10/17/19 1500      Core Components/Risk Factors/Patient Goals Review   Personal Goals Review Improve shortness of breath with ADL's;Weight Management/Obesity   Build stamina; improve lung capacity; lose 10 lbs.   Review Patient is new to the program. She has completed 3 sessions. Will continue to monitor for progress as she works toward meeting her personal goals.    Expected Outcomes Patient will complete the program meeting both personal and program goals.           ITP Comments:  Comments: ITP REVIEW Pt is making expected progress toward pulmonary rehab goals after completing 3 sessions. Recommend continued exercise, life style modification, education, and utilization of breathing techniques to increase stamina and strength and decrease shortness of breath with exertion.

## 2019-10-18 NOTE — Progress Notes (Signed)
Daily Session Note  Patient Details  Name: Emily Rocha MRN: [id number] Date of Birth: 11-19-1955 Referring Provider:     PULMONARY REHAB OTHER RESP ORIENTATION from 10/01/2019 in [place]  Referring Provider Dr. Vaughan Browner      Encounter Date: 10/18/2019  Check In:  Session Check In - 10/18/19 1050      Check-In   Supervising physician immediately available to respond to emergencies See telemetry face sheet for immediately available MD    Location [facility]    Staff Present Ramon Dredge, RN, Madelaine Bhat, RN, BSN    Virtual Visit No    Medication changes reported     No    Fall or balance concerns reported    No    Tobacco Cessation No Change    Warm-up and Cool-down Performed as group-led instruction    Resistance Training Performed Yes    VAD Patient? No    PAD/SET Patient? No      Pain Assessment   Currently in Pain? No/denies           Capillary Blood Glucose: No results found for this or any previous visit (from the past 24 hour(s)).    Social History   Tobacco Use  Smoking Status Former Smoker  . Packs/day: 2.00  . Years: 40.00  . Pack years: 80.00  . Quit date: 10/24/2015  . Years since quitting: 3.9  Smokeless Tobacco Never Used    Goals Met:  Proper associated with RPD/PD & O2 Sat Independence with exercise equipment Improved SOB with ADL's Using PLB without cueing & demonstrates good technique Exercise tolerated well No report of cardiac concerns or symptoms Strength training completed today  Goals Unmet:  Not Applicable  Comments: [phone]   Dr. Kathie Dike is Medical Director for [facility].

## 2019-10-23 ENCOUNTER — Encounter (HOSPITAL_COMMUNITY): Payer: Medicare HMO

## 2019-10-24 ENCOUNTER — Encounter (HOSPITAL_COMMUNITY): Payer: Self-pay

## 2019-10-24 ENCOUNTER — Emergency Department (HOSPITAL_COMMUNITY): Payer: Medicare HMO

## 2019-10-24 ENCOUNTER — Other Ambulatory Visit: Payer: Self-pay

## 2019-10-24 ENCOUNTER — Inpatient Hospital Stay (HOSPITAL_COMMUNITY)
Admission: EM | Admit: 2019-10-24 | Discharge: 2019-11-06 | DRG: 189 | Disposition: A | Discharge status: Home Health | Payer: Medicare HMO | Attending: Internal Medicine | Admitting: Internal Medicine

## 2019-10-24 DIAGNOSIS — Z85118 Personal history of other malignant neoplasm of bronchus and lung: Secondary | ICD-10-CM

## 2019-10-24 DIAGNOSIS — Z902 Acquired absence of lung [part of]: Secondary | ICD-10-CM

## 2019-10-24 DIAGNOSIS — K449 Diaphragmatic hernia without obstruction or gangrene: Secondary | ICD-10-CM | POA: Diagnosis not present

## 2019-10-24 DIAGNOSIS — I351 Nonrheumatic aortic (valve) insufficiency: Secondary | ICD-10-CM | POA: Diagnosis not present

## 2019-10-24 DIAGNOSIS — R778 Other specified abnormalities of plasma proteins: Secondary | ICD-10-CM

## 2019-10-24 DIAGNOSIS — I248 Other forms of acute ischemic heart disease: Secondary | ICD-10-CM | POA: Diagnosis present

## 2019-10-24 DIAGNOSIS — I352 Nonrheumatic aortic (valve) stenosis with insufficiency: Secondary | ICD-10-CM | POA: Diagnosis present

## 2019-10-24 DIAGNOSIS — F419 Anxiety disorder, unspecified: Secondary | ICD-10-CM | POA: Diagnosis present

## 2019-10-24 DIAGNOSIS — Z7901 Long term (current) use of anticoagulants: Secondary | ICD-10-CM | POA: Diagnosis not present

## 2019-10-24 DIAGNOSIS — J690 Pneumonitis due to inhalation of food and vomit: Secondary | ICD-10-CM | POA: Diagnosis not present

## 2019-10-24 DIAGNOSIS — E871 Hypo-osmolality and hyponatremia: Secondary | ICD-10-CM | POA: Diagnosis present

## 2019-10-24 DIAGNOSIS — I10 Essential (primary) hypertension: Secondary | ICD-10-CM | POA: Diagnosis not present

## 2019-10-24 DIAGNOSIS — I2510 Atherosclerotic heart disease of native coronary artery without angina pectoris: Secondary | ICD-10-CM | POA: Diagnosis not present

## 2019-10-24 DIAGNOSIS — A4902 Methicillin resistant Staphylococcus aureus infection, unspecified site: Secondary | ICD-10-CM | POA: Diagnosis not present

## 2019-10-24 DIAGNOSIS — J441 Chronic obstructive pulmonary disease with (acute) exacerbation: Secondary | ICD-10-CM | POA: Diagnosis not present

## 2019-10-24 DIAGNOSIS — I361 Nonrheumatic tricuspid (valve) insufficiency: Secondary | ICD-10-CM | POA: Diagnosis not present

## 2019-10-24 DIAGNOSIS — Z8249 Family history of ischemic heart disease and other diseases of the circulatory system: Secondary | ICD-10-CM

## 2019-10-24 DIAGNOSIS — J9600 Acute respiratory failure, unspecified whether with hypoxia or hypercapnia: Secondary | ICD-10-CM | POA: Diagnosis not present

## 2019-10-24 DIAGNOSIS — E875 Hyperkalemia: Secondary | ICD-10-CM | POA: Diagnosis not present

## 2019-10-24 DIAGNOSIS — I340 Nonrheumatic mitral (valve) insufficiency: Secondary | ICD-10-CM | POA: Diagnosis not present

## 2019-10-24 DIAGNOSIS — J869 Pyothorax without fistula: Secondary | ICD-10-CM

## 2019-10-24 DIAGNOSIS — Z20822 Contact with and (suspected) exposure to covid-19: Secondary | ICD-10-CM | POA: Diagnosis present

## 2019-10-24 DIAGNOSIS — K5900 Constipation, unspecified: Secondary | ICD-10-CM | POA: Diagnosis not present

## 2019-10-24 DIAGNOSIS — R0602 Shortness of breath: Secondary | ICD-10-CM | POA: Diagnosis not present

## 2019-10-24 DIAGNOSIS — F05 Delirium due to known physiological condition: Secondary | ICD-10-CM | POA: Diagnosis present

## 2019-10-24 DIAGNOSIS — I700 Atherosclerosis of aorta: Secondary | ICD-10-CM | POA: Diagnosis not present

## 2019-10-24 DIAGNOSIS — M25519 Pain in unspecified shoulder: Secondary | ICD-10-CM

## 2019-10-24 DIAGNOSIS — I5032 Chronic diastolic (congestive) heart failure: Secondary | ICD-10-CM | POA: Diagnosis present

## 2019-10-24 DIAGNOSIS — E46 Unspecified protein-calorie malnutrition: Secondary | ICD-10-CM | POA: Clinically undetermined

## 2019-10-24 DIAGNOSIS — H9190 Unspecified hearing loss, unspecified ear: Secondary | ICD-10-CM | POA: Diagnosis present

## 2019-10-24 DIAGNOSIS — E876 Hypokalemia: Secondary | ICD-10-CM | POA: Diagnosis not present

## 2019-10-24 DIAGNOSIS — J189 Pneumonia, unspecified organism: Secondary | ICD-10-CM | POA: Diagnosis present

## 2019-10-24 DIAGNOSIS — J90 Pleural effusion, not elsewhere classified: Secondary | ICD-10-CM | POA: Diagnosis present

## 2019-10-24 DIAGNOSIS — R9082 White matter disease, unspecified: Secondary | ICD-10-CM | POA: Diagnosis not present

## 2019-10-24 DIAGNOSIS — J918 Pleural effusion in other conditions classified elsewhere: Secondary | ICD-10-CM

## 2019-10-24 DIAGNOSIS — I2699 Other pulmonary embolism without acute cor pulmonale: Secondary | ICD-10-CM | POA: Diagnosis present

## 2019-10-24 DIAGNOSIS — Z781 Physical restraint status: Secondary | ICD-10-CM

## 2019-10-24 DIAGNOSIS — M25512 Pain in left shoulder: Secondary | ICD-10-CM | POA: Diagnosis present

## 2019-10-24 DIAGNOSIS — I517 Cardiomegaly: Secondary | ICD-10-CM | POA: Diagnosis not present

## 2019-10-24 DIAGNOSIS — J80 Acute respiratory distress syndrome: Secondary | ICD-10-CM | POA: Diagnosis not present

## 2019-10-24 DIAGNOSIS — I350 Nonrheumatic aortic (valve) stenosis: Secondary | ICD-10-CM | POA: Diagnosis not present

## 2019-10-24 DIAGNOSIS — R509 Fever, unspecified: Secondary | ICD-10-CM | POA: Diagnosis not present

## 2019-10-24 DIAGNOSIS — J440 Chronic obstructive pulmonary disease with acute lower respiratory infection: Secondary | ICD-10-CM | POA: Diagnosis present

## 2019-10-24 DIAGNOSIS — G9341 Metabolic encephalopathy: Secondary | ICD-10-CM

## 2019-10-24 DIAGNOSIS — J9602 Acute respiratory failure with hypercapnia: Secondary | ICD-10-CM | POA: Diagnosis present

## 2019-10-24 DIAGNOSIS — J811 Chronic pulmonary edema: Secondary | ICD-10-CM | POA: Diagnosis not present

## 2019-10-24 DIAGNOSIS — G92 Toxic encephalopathy: Secondary | ICD-10-CM | POA: Diagnosis not present

## 2019-10-24 DIAGNOSIS — A419 Sepsis, unspecified organism: Secondary | ICD-10-CM | POA: Diagnosis not present

## 2019-10-24 DIAGNOSIS — Z7982 Long term (current) use of aspirin: Secondary | ICD-10-CM

## 2019-10-24 DIAGNOSIS — J9811 Atelectasis: Secondary | ICD-10-CM | POA: Diagnosis present

## 2019-10-24 DIAGNOSIS — J9601 Acute respiratory failure with hypoxia: Principal | ICD-10-CM | POA: Diagnosis present

## 2019-10-24 DIAGNOSIS — Z79899 Other long term (current) drug therapy: Secondary | ICD-10-CM

## 2019-10-24 DIAGNOSIS — R7989 Other specified abnormal findings of blood chemistry: Secondary | ICD-10-CM | POA: Diagnosis not present

## 2019-10-24 DIAGNOSIS — I8290 Acute embolism and thrombosis of unspecified vein: Secondary | ICD-10-CM | POA: Diagnosis not present

## 2019-10-24 DIAGNOSIS — Z951 Presence of aortocoronary bypass graft: Secondary | ICD-10-CM

## 2019-10-24 DIAGNOSIS — F13239 Sedative, hypnotic or anxiolytic dependence with withdrawal, unspecified: Secondary | ICD-10-CM | POA: Diagnosis present

## 2019-10-24 DIAGNOSIS — I739 Peripheral vascular disease, unspecified: Secondary | ICD-10-CM | POA: Diagnosis present

## 2019-10-24 DIAGNOSIS — I509 Heart failure, unspecified: Secondary | ICD-10-CM | POA: Diagnosis not present

## 2019-10-24 DIAGNOSIS — Z833 Family history of diabetes mellitus: Secondary | ICD-10-CM

## 2019-10-24 DIAGNOSIS — R0600 Dyspnea, unspecified: Secondary | ICD-10-CM

## 2019-10-24 DIAGNOSIS — I7090 Unspecified atherosclerosis: Secondary | ICD-10-CM | POA: Diagnosis not present

## 2019-10-24 DIAGNOSIS — R4182 Altered mental status, unspecified: Secondary | ICD-10-CM | POA: Diagnosis not present

## 2019-10-24 DIAGNOSIS — R0902 Hypoxemia: Secondary | ICD-10-CM | POA: Diagnosis not present

## 2019-10-24 DIAGNOSIS — Z8673 Personal history of transient ischemic attack (TIA), and cerebral infarction without residual deficits: Secondary | ICD-10-CM

## 2019-10-24 DIAGNOSIS — R0689 Other abnormalities of breathing: Secondary | ICD-10-CM | POA: Diagnosis not present

## 2019-10-24 DIAGNOSIS — Z87891 Personal history of nicotine dependence: Secondary | ICD-10-CM

## 2019-10-24 DIAGNOSIS — K219 Gastro-esophageal reflux disease without esophagitis: Secondary | ICD-10-CM | POA: Diagnosis present

## 2019-10-24 LAB — CBC WITH DIFFERENTIAL/PLATELET
Band Neutrophils: 0 %
Basophils Absolute: 0.1 10*3/uL (ref 0.0–0.1)
Basophils Relative: 1 %
Blasts: 0 %
Eosinophils Absolute: 0 10*3/uL (ref 0.0–0.5)
Eosinophils Relative: 0 %
HCT: 39.7 % (ref 36.0–46.0)
Hemoglobin: 12.1 g/dL (ref 12.0–15.0)
Lymphocytes Relative: 12 %
Lymphs Abs: 1.5 10*3/uL (ref 0.7–4.0)
MCH: 27.3 pg (ref 26.0–34.0)
MCHC: 30.5 g/dL (ref 30.0–36.0)
MCV: 89.4 fL (ref 80.0–100.0)
Metamyelocytes Relative: 0 %
Monocytes Absolute: 1.5 10*3/uL — ABNORMAL HIGH (ref 0.1–1.0)
Monocytes Relative: 12 %
Myelocytes: 0 %
Neutro Abs: 9.1 10*3/uL — ABNORMAL HIGH (ref 1.7–7.7)
Neutrophils Relative %: 73 %
Other: 2 %
Platelets: 224 10*3/uL (ref 150–400)
Promyelocytes Relative: 0 %
RBC: 4.44 MIL/uL (ref 3.87–5.11)
RDW: 15.3 % (ref 11.5–15.5)
WBC: 12.4 10*3/uL — ABNORMAL HIGH (ref 4.0–10.5)
nRBC: 0.6 % — ABNORMAL HIGH (ref 0.0–0.2)
nRBC: 5 /100 WBC — ABNORMAL HIGH

## 2019-10-24 LAB — POTASSIUM: Potassium: 5.8 mmol/L — ABNORMAL HIGH (ref 3.5–5.1)

## 2019-10-24 LAB — COMPREHENSIVE METABOLIC PANEL
ALT: 145 U/L — ABNORMAL HIGH (ref 0–44)
AST: 46 U/L — ABNORMAL HIGH (ref 15–41)
Albumin: 3.6 g/dL (ref 3.5–5.0)
Alkaline Phosphatase: 114 U/L (ref 38–126)
Anion gap: 8 (ref 5–15)
BUN: 46 mg/dL — ABNORMAL HIGH (ref 8–23)
CO2: 27 mmol/L (ref 22–32)
Calcium: 8.2 mg/dL — ABNORMAL LOW (ref 8.9–10.3)
Chloride: 92 mmol/L — ABNORMAL LOW (ref 98–111)
Creatinine, Ser: 1.17 mg/dL — ABNORMAL HIGH (ref 0.44–1.00)
GFR calc Af Amer: 56 mL/min — ABNORMAL LOW (ref >60)
GFR calc non Af Amer: 49 mL/min — ABNORMAL LOW (ref >60)
Glucose, Bld: 158 mg/dL — ABNORMAL HIGH (ref 70–99)
Potassium: 5.5 mmol/L — ABNORMAL HIGH (ref 3.5–5.1)
Sodium: 127 mmol/L — ABNORMAL LOW (ref 135–145)
Total Bilirubin: 1.1 mg/dL (ref 0.3–1.2)
Total Protein: 6.7 g/dL (ref 6.5–8.1)

## 2019-10-24 LAB — LACTIC ACID, PLASMA
Lactic Acid, Venous: 1.2 mmol/L (ref 0.5–1.9)
Lactic Acid, Venous: 1.1 mmol/L (ref 0.5–1.9)

## 2019-10-24 LAB — TRIGLYCERIDES: Triglycerides: 151 mg/dL — ABNORMAL HIGH (ref <150)

## 2019-10-24 LAB — PROCALCITONIN: Procalcitonin: 0.15 ng/mL

## 2019-10-24 LAB — TROPONIN I (HIGH SENSITIVITY)
Troponin I (High Sensitivity): 79 ng/L — ABNORMAL HIGH (ref <18)
Troponin I (High Sensitivity): 84 ng/L — ABNORMAL HIGH (ref <18)

## 2019-10-24 LAB — LACTATE DEHYDROGENASE: LDH: 191 U/L (ref 98–192)

## 2019-10-24 LAB — C-REACTIVE PROTEIN: CRP: 8.4 mg/dL — ABNORMAL HIGH (ref <1.0)

## 2019-10-24 LAB — FERRITIN: Ferritin: 13 ng/mL (ref 11–307)

## 2019-10-24 LAB — FIBRINOGEN: Fibrinogen: 358 mg/dL (ref 210–475)

## 2019-10-24 LAB — SARS CORONAVIRUS 2 BY RT PCR (HOSPITAL ORDER, PERFORMED IN ~~LOC~~ HOSPITAL LAB): SARS Coronavirus 2: NEGATIVE

## 2019-10-24 LAB — D-DIMER, QUANTITATIVE: D-Dimer, Quant: 5.27 ug/mL-FEU — ABNORMAL HIGH (ref 0.00–0.50)

## 2019-10-24 MED ORDER — IPRATROPIUM-ALBUTEROL 0.5-2.5 (3) MG/3ML IN SOLN
3.0000 mL | Freq: Once | RESPIRATORY_TRACT | Status: AC
  Administered 2019-10-24: 3 mL via RESPIRATORY_TRACT
  Filled 2019-10-24: qty 3

## 2019-10-24 MED ORDER — ONDANSETRON HCL 4 MG/2ML IJ SOLN
4.0000 mg | Freq: Four times a day (QID) | INTRAMUSCULAR | Status: DC | PRN
  Administered 2019-10-30: 4 mg via INTRAVENOUS
  Filled 2019-10-24: qty 2

## 2019-10-24 MED ORDER — HEPARIN (PORCINE) 25000 UT/250ML-% IV SOLN
1000.0000 [IU]/h | INTRAVENOUS | Status: DC
  Administered 2019-10-24: 1200 [IU]/h via INTRAVENOUS
  Administered 2019-10-25: 1100 [IU]/h via INTRAVENOUS
  Administered 2019-10-27 – 2019-10-29 (×2): 950 [IU]/h via INTRAVENOUS
  Administered 2019-10-30 – 2019-11-01 (×3): 1100 [IU]/h via INTRAVENOUS
  Administered 2019-11-02 – 2019-11-03 (×2): 1150 [IU]/h via INTRAVENOUS
  Administered 2019-11-04: 1000 [IU]/h via INTRAVENOUS
  Filled 2019-10-24 (×11): qty 250

## 2019-10-24 MED ORDER — METHYLPREDNISOLONE SODIUM SUCC 125 MG IJ SOLR
60.0000 mg | Freq: Two times a day (BID) | INTRAMUSCULAR | Status: DC
  Administered 2019-10-25: 60 mg via INTRAVENOUS
  Filled 2019-10-24: qty 2

## 2019-10-24 MED ORDER — METOPROLOL SUCCINATE ER 50 MG PO TB24
50.0000 mg | ORAL_TABLET | Freq: Every day | ORAL | Status: DC
  Administered 2019-10-25 – 2019-10-27 (×3): 50 mg via ORAL
  Filled 2019-10-24: qty 1
  Filled 2019-10-24: qty 2
  Filled 2019-10-24 (×2): qty 1

## 2019-10-24 MED ORDER — ACETAMINOPHEN 325 MG PO TABS
650.0000 mg | ORAL_TABLET | Freq: Four times a day (QID) | ORAL | Status: DC | PRN
  Administered 2019-10-25 – 2019-11-06 (×14): 650 mg via ORAL
  Filled 2019-10-24 (×14): qty 2

## 2019-10-24 MED ORDER — ONDANSETRON HCL 4 MG PO TABS: 4.0000 mg | ORAL_TABLET | Freq: Four times a day (QID) | ORAL | Status: DC | PRN

## 2019-10-24 MED ORDER — ACETAMINOPHEN 650 MG RE SUPP: 650.0000 mg | Freq: Four times a day (QID) | RECTAL | Status: DC | PRN

## 2019-10-24 MED ORDER — SODIUM CHLORIDE 0.45 % IV SOLN: INTRAVENOUS | Status: AC

## 2019-10-24 MED ORDER — ASPIRIN 81 MG PO CHEW
81.0000 mg | CHEWABLE_TABLET | Freq: Every day | ORAL | Status: DC
  Administered 2019-10-24 – 2019-10-27 (×4): 81 mg via ORAL
  Filled 2019-10-24 (×5): qty 1

## 2019-10-24 MED ORDER — MAGNESIUM SULFATE 2 GM/50ML IV SOLN
2.0000 g | Freq: Once | INTRAVENOUS | Status: AC
  Administered 2019-10-24: 2 g via INTRAVENOUS
  Filled 2019-10-24: qty 50

## 2019-10-24 MED ORDER — METHYLPREDNISOLONE SODIUM SUCC 125 MG IJ SOLR
125.0000 mg | Freq: Once | INTRAMUSCULAR | Status: AC
  Administered 2019-10-24: 125 mg via INTRAVENOUS
  Filled 2019-10-24: qty 2

## 2019-10-24 MED ORDER — SODIUM CHLORIDE 0.9 % IV SOLN
3.0000 g | Freq: Three times a day (TID) | INTRAVENOUS | Status: DC
  Administered 2019-10-24 – 2019-11-02 (×27): 3 g via INTRAVENOUS
  Filled 2019-10-24 (×4): qty 8
  Filled 2019-10-24: qty 3
  Filled 2019-10-24: qty 8
  Filled 2019-10-24 (×2): qty 3
  Filled 2019-10-24: qty 8
  Filled 2019-10-24 (×2): qty 3
  Filled 2019-10-24 (×2): qty 8
  Filled 2019-10-24: qty 3
  Filled 2019-10-24 (×2): qty 8
  Filled 2019-10-24 (×2): qty 3
  Filled 2019-10-24 (×2): qty 8
  Filled 2019-10-24 (×2): qty 3
  Filled 2019-10-24: qty 8
  Filled 2019-10-24: qty 3
  Filled 2019-10-24: qty 8
  Filled 2019-10-24: qty 3
  Filled 2019-10-24: qty 8
  Filled 2019-10-24: qty 3
  Filled 2019-10-24: qty 8
  Filled 2019-10-24: qty 3
  Filled 2019-10-24 (×2): qty 8
  Filled 2019-10-24 (×2): qty 3
  Filled 2019-10-24: qty 8
  Filled 2019-10-24: qty 3

## 2019-10-24 MED ORDER — IPRATROPIUM-ALBUTEROL 0.5-2.5 (3) MG/3ML IN SOLN: 3.0000 mL | Freq: Once | RESPIRATORY_TRACT | Status: DC

## 2019-10-24 MED ORDER — ALPRAZOLAM 0.25 MG PO TABS: 0.2500 mg | ORAL_TABLET | Freq: Two times a day (BID) | ORAL | Status: DC | PRN

## 2019-10-24 MED ORDER — IOHEXOL 350 MG/ML SOLN
100.0000 mL | Freq: Once | INTRAVENOUS | Status: AC | PRN
  Administered 2019-10-24: 100 mL via INTRAVENOUS

## 2019-10-24 MED ORDER — ALBUTEROL SULFATE HFA 108 (90 BASE) MCG/ACT IN AERS
2.0000 | INHALATION_SPRAY | Freq: Once | RESPIRATORY_TRACT | Status: AC
  Administered 2019-10-24: 2 via RESPIRATORY_TRACT
  Filled 2019-10-24: qty 6.7

## 2019-10-24 MED ORDER — MOMETASONE FURO-FORMOTEROL FUM 200-5 MCG/ACT IN AERO
2.0000 | INHALATION_SPRAY | Freq: Two times a day (BID) | RESPIRATORY_TRACT | Status: DC
  Administered 2019-10-24 – 2019-11-06 (×23): 2 via RESPIRATORY_TRACT
  Filled 2019-10-24 (×2): qty 8.8

## 2019-10-24 MED ORDER — AZITHROMYCIN 500 MG PO TABS
500.0000 mg | ORAL_TABLET | Freq: Every day | ORAL | Status: DC
  Administered 2019-10-24 – 2019-10-27 (×4): 500 mg via ORAL
  Filled 2019-10-24 (×2): qty 2
  Filled 2019-10-24: qty 1
  Filled 2019-10-24: qty 2

## 2019-10-24 MED ORDER — ISOSORBIDE MONONITRATE ER 30 MG PO TB24
15.0000 mg | ORAL_TABLET | Freq: Every day | ORAL | Status: DC
  Administered 2019-10-25 – 2019-11-06 (×12): 15 mg via ORAL
  Filled 2019-10-24 (×14): qty 1

## 2019-10-24 MED ORDER — HEPARIN BOLUS VIA INFUSION
4000.0000 [IU] | Freq: Once | INTRAVENOUS | Status: AC
  Administered 2019-10-24: 4000 [IU] via INTRAVENOUS

## 2019-10-24 MED ORDER — ALBUTEROL SULFATE HFA 108 (90 BASE) MCG/ACT IN AERS: 4.0000 | INHALATION_SPRAY | Freq: Once | RESPIRATORY_TRACT | Status: DC

## 2019-10-24 MED ORDER — ALBUTEROL (5 MG/ML) CONTINUOUS INHALATION SOLN
7.5000 mg/h | INHALATION_SOLUTION | Freq: Once | RESPIRATORY_TRACT | Status: AC
  Administered 2019-10-24: 7.5 mg/h via RESPIRATORY_TRACT
  Filled 2019-10-24: qty 20

## 2019-10-24 MED ORDER — ALBUTEROL SULFATE (2.5 MG/3ML) 0.083% IN NEBU
2.5000 mg | INHALATION_SOLUTION | RESPIRATORY_TRACT | Status: DC | PRN
  Administered 2019-10-30 – 2019-11-05 (×4): 2.5 mg via RESPIRATORY_TRACT
  Filled 2019-10-24 (×4): qty 3

## 2019-10-24 MED ORDER — SODIUM CHLORIDE 0.9 % IV BOLUS
500.0000 mL | Freq: Once | INTRAVENOUS | Status: AC
  Administered 2019-10-24: 500 mL via INTRAVENOUS

## 2019-10-24 MED ORDER — SODIUM ZIRCONIUM CYCLOSILICATE 5 G PO PACK
10.0000 g | PACK | Freq: Once | ORAL | Status: AC
  Administered 2019-10-24: 10 g via ORAL
  Filled 2019-10-24: qty 2

## 2019-10-24 MED ORDER — IPRATROPIUM-ALBUTEROL 0.5-2.5 (3) MG/3ML IN SOLN
3.0000 mL | Freq: Four times a day (QID) | RESPIRATORY_TRACT | Status: DC
  Administered 2019-10-24 – 2019-11-01 (×28): 3 mL via RESPIRATORY_TRACT
  Filled 2019-10-24 (×10): qty 3
  Filled 2019-10-24: qty 33
  Filled 2019-10-24 (×20): qty 3

## 2019-10-24 MED ORDER — PANTOPRAZOLE SODIUM 40 MG PO TBEC
40.0000 mg | DELAYED_RELEASE_TABLET | Freq: Every day | ORAL | Status: DC
  Administered 2019-10-24 – 2019-10-27 (×4): 40 mg via ORAL
  Filled 2019-10-24 (×5): qty 1

## 2019-10-24 NOTE — ED Notes (Signed)
Pt placed on 4L via [place].

## 2019-10-24 NOTE — ED Triage Notes (Signed)
EMS reports pt c/o fever, chills, and sob x 1 week.  EMS arrived today and o2 sat on room air was 88%.    First responders put her on NRB, o2 sat 97% on 4liters o2 via [place].  PT alert and oriented.

## 2019-10-24 NOTE — ED Provider Notes (Addendum)
[facility] EMERGENCY DEPARTMENT Provider Note   CSN: [id number] Arrival date & time: 10/24/19  1302    History Chief Complaint  Patient presents with   Shortness of Breath    Emily Rocha is a 64 y.o. female with past medical history significant for COPD, CAD, PVD who presents for evaluation of shortness of breath.  Patient with fever, chills, shortness of breath x1 week.  Upon EMS arrival patient hypoxic to 88% on room air.  Initially placed on nonrebreather.  Was able to be titrated on 4 L via nasal cannula at 97%.  Patient is not vaccinated against Covid.  Feels tight across her chest.  Has felt this way for 1 week however shortness of breath has worsened over the last 24 hours.  She has been using an albuterol inhaler at home without relief.  Was supposed to get her Covid vaccine tomorrow with her husband.  Subjective fever.  Denies headache, lightheadedness, dizziness abdominal pain, diarrhea, dysuria, leg swelling, redness, warmth, rashes or lesions.  Denies additional aggravating or alleviating factors.  Last hospital admission for COPD "long time ago"  Adenocarcinoma right upper lobe s/p R VATS right upper lobe lobectomy 10/2018 Post CABG and aortic stenosis  History obtained from patient and past medical records.  No interpreter is used.    HPI     Past Medical History:  Diagnosis Date   Anxiety    Asthma    COPD (chronic obstructive pulmonary disease) ([place])    Coronary artery disease    hx CABG   GERD (gastroesophageal reflux disease)    Peripheral vascular disease (HCC)    Pneumonia    Shortness of breath dyspnea    Stroke [facility])    "mini stroke" numbness and weakness to left side of the body    Patient Active Problem List   Diagnosis Date Noted   Acute respiratory failure with hypoxia ([place]) 10/24/2019   Pneumonia 10/24/2019   Pleural effusion 10/24/2019   History of lung cancer 10/24/2019   CAD (coronary artery disease) 10/24/2019   Acute  respiratory failure ([place]) 10/24/2019   Malignant neoplasm of right upper lobe of lung ([place]) 11/27/2018   S/P lobectomy of lung 10/26/2018   Nodule of upper lobe of right lung 09/13/2018   Wound of sternal region 01/16/2018   DVT (deep venous thrombosis) ([place]) 01/16/2018   TIA due to embolism ([place]) 38/25/0539   Acute diastolic heart failure ([place])    Unstable angina ([place]) 12/14/2016   S/P CABG x 3 12/14/2016   Chest pain 12/12/2016   NSTEMI (non-ST elevated myocardial infarction) ([place]) 12/12/2016   COPD with acute exacerbation ([place]) 12/12/2016   GERD (gastroesophageal reflux disease) 12/12/2016   CAP (community acquired pneumonia) 12/12/2016   Abdominal pain, epigastric 11/30/2016   Absolute anemia 11/30/2016   Carotid stenosis 12/19/2014   Aftercare following surgery of the circulatory system 12/19/2014   Carotid artery stenosis, symptomatic 12/06/2014   Symptomatic carotid artery stenosis 12/04/2014    Past Surgical History:  Procedure Laterality Date   AORTIC ARCH ANGIOGRAPHY N/A 11/04/2017   Procedure: AORTIC ARCH ANGIOGRAPHY;  Surgeon: Elam Dutch, MD;  Location: [place] CV LAB;  Service: Cardiovascular;  Laterality: N/A;   APPLICATION OF WOUND VAC N/A 01/06/2018   Procedure: APPLICATION OF WOUND VAC;  Surgeon: Ivin Poot, MD;  Location: [place];  Service: Thoracic;  Laterality: N/A;   APPLICATION OF WOUND VAC N/A 01/13/2018   Procedure: WOUND VAC CHANGE;  Surgeon: Ivin Poot, MD;  Location: [facility] OR;  Service: Thoracic;  Laterality: N/A;   CARDIAC CATHETERIZATION     CAROTID-SUBCLAVIAN BYPASS GRAFT Right 12/26/2017   Procedure: AORTIC TO RIGHT COMMON CAROTID AND RIGHT SUBCLAVIAN  ARTERY  BYPASS;  Surgeon: Elam Dutch, MD;  Location: [place];  Service: Vascular;  Laterality: Right;   CHOLECYSTECTOMY     CLOSURE OF DIAPHRAGM  12/26/2017   Procedure: REPAIR OF DIAPHRAGM;  Surgeon: Ivin Poot, MD;  Location: [place];  Service:  Thoracic;;   COLONOSCOPY N/A 10/03/2014   Procedure: COLONOSCOPY;  Surgeon: Rogene Houston, MD;  Location: [facility];  Service: Endoscopy;  Laterality: N/A;  730   CORONARY ARTERY BYPASS GRAFT N/A 12/14/2016   Procedure: CORONARY ARTERY BYPASS GRAFTING (CABG) x three , using left internal mammary artery and right leg greater saphenous vein harvested endoscopically;  Surgeon: Ivin Poot, MD;  Location: [place];  Service: Open Heart Surgery;  Laterality: N/A;   ENDARTERECTOMY Right 12/06/2014   Procedure: ENDARTERECTOMY CAROTid;  Surgeon: Elam Dutch, MD;  Location: [facility] OR;  Service: Vascular;  Laterality: Right;   ESOPHAGOGASTRODUODENOSCOPY N/A 12/03/2016   Procedure: ESOPHAGOGASTRODUODENOSCOPY (EGD);  Surgeon: Rogene Houston, MD;  [facility]: [facility];  Service: Endoscopy;  Laterality: N/A;  7:30   LEFT HEART CATH AND CORONARY ANGIOGRAPHY N/A 12/14/2016   Procedure: LEFT HEART CATH AND CORONARY ANGIOGRAPHY;  Surgeon: Burnell Blanks, MD;  Location: [place] CV LAB;  Service: Cardiovascular;  Laterality: N/A;   LOBECTOMY Right 10/26/2018   Procedure: RIGHT UPPER LOBECTOMY;  Surgeon: Lajuana Matte, MD;  Location: [place];  Service: Thoracic;  Laterality: Right;   PERIPHERAL VASCULAR CATHETERIZATION N/A 12/05/2014   Procedure:  Carotid Arch Angiography;  Surgeon: Conrad Yutan, MD;  Location: [place] CV LAB;  Service: Cardiovascular;  Laterality: N/A;   STERNAL WOUND DEBRIDEMENT N/A 01/06/2018   Procedure: STERNAL WOUND DEBRIDEMENT;  Surgeon: Ivin Poot, MD;  Location: [place];  Service: Thoracic;  Laterality: N/A;   STERNOTOMY N/A 12/26/2017   Procedure: REDO STERNOTOMY;  Surgeon: Prescott Gum, Collier Salina, MD;  Location: [place];  Service: Thoracic;  Laterality: N/A;   TEE WITHOUT CARDIOVERSION N/A 12/14/2016   Procedure: TRANSESOPHAGEAL ECHOCARDIOGRAM (TEE);  Surgeon: Prescott Gum, Collier Salina, MD;  Location: [place];  Service: Open Heart Surgery;  Laterality: N/A;    VIDEO ASSISTED THORACOSCOPY (VATS)/WEDGE RESECTION Right 10/26/2018   Procedure: VIDEO ASSISTED THORACOSCOPY (VATS)/WEDGE RESECTION;  Surgeon: Lajuana Matte, MD;  Location: [place];  Service: Thoracic;  Laterality: Right;   VIDEO BRONCHOSCOPY N/A 10/26/2018   Procedure: VIDEO BRONCHOSCOPY;  Surgeon: Lajuana Matte, MD;  Location: [place];  Service: Thoracic;  Laterality: N/A;     OB History   No obstetric history on file.     Family History  Problem Relation Age of Onset   Heart attack Mother    Cancer Mother    Diabetes Mother    Heart disease Mother    Heart attack Father    Heart disease Father     Social History   Tobacco Use   Smoking status: Former Smoker    Packs/day: 2.00    Years: 40.00    Pack years: 80.00    Quit date: 10/24/2015    Years since quitting: 4.0   Smokeless tobacco: Never Used  Vaping Use   Vaping Use: Former   Start date: 10/23/2013   Quit date: 10/24/2015  Substance Use Topics   Alcohol use: No  Alcohol/week: 0.0 standard drinks   Drug use: No    Home Medications Prior to Admission medications   Medication Sig Start Date End Date Taking? Authorizing Provider  albuterol (PROVENTIL) (2.5 MG/3ML) 0.083% nebulizer solution Take 2.5 mg by nebulization every 4 (four) hours as needed. 03/30/19  Yes [provider]  ALPRAZolam Duanne Moron) 0.5 MG tablet Take 0.5 mg by mouth 3 (three) times daily.    Yes [provider]  Ascorbic Acid (VITAMIN C GUMMIE) 120 MG CHEW Chew 500 mg by mouth.   Yes [provider]  aspirin 81 MG chewable tablet Chew 81 mg by mouth daily.   Yes [provider]  atorvastatin (LIPITOR) 40 MG tablet Take 1 tablet (40 mg total) daily by mouth. 12/27/16  Yes Gold, Wayne E, PA-C  calcium carbonate (OS-CAL) 1250 (500 Ca) MG chewable tablet Chew 1 tablet by mouth daily.   Yes [provider]  cholecalciferol (VITAMIN D3) 25 MCG (1000 UNIT) tablet Take 1,000 Units by mouth  daily.   Yes [provider]  diphenhydramine-acetaminophen (TYLENOL PM) 25-500 MG TABS tablet Take 2-3 tablets by mouth at bedtime.   Yes [provider]  isosorbide mononitrate (IMDUR) 30 MG 24 hr tablet Take 0.5 tablets (15 mg total) by mouth daily. 03/21/19  Yes BranchAlphonse Guild, MD  metoprolol succinate (TOPROL-XL) 50 MG 24 hr tablet Take 50 mg by mouth daily. Take with or immediately following a meal.   Yes [provider]  Multiple Vitamin (MULTIVITAMIN WITH MINERALS) TABS tablet Take 1 tablet by mouth daily. 12/01/18  Yes Lars Pinks M, PA-C  pantoprazole (PROTONIX) 40 MG tablet Take 40 mg by mouth daily.   Yes [provider]  potassium chloride SA (K-DUR,KLOR-CON) 20 MEQ tablet Take 20 mEq by mouth daily.  10/20/17  Yes [provider]  Tiotropium Bromide-Olodaterol (STIOLTO RESPIMAT) 2.5-2.5 MCG/ACT AERS Inhale 2 puffs into the lungs daily. 07/19/19  Yes Mannam, Praveen, MD  torsemide (DEMADEX) 20 MG tablet Take 1 tablet (20 mg total) by mouth daily. 11/01/18  Yes Nani Skillern, PA-C    Allergies    Patient has no known allergies.  Review of Systems   Review of Systems  Constitutional: Positive for activity change, appetite change, fatigue and fever.  HENT: Positive for congestion, postnasal drip and rhinorrhea.   Respiratory: Positive for cough, chest tightness and shortness of breath.   Cardiovascular: Negative.   Gastrointestinal: Negative.   Genitourinary: Negative.   Musculoskeletal: Positive for myalgias.  Skin: Negative.   Neurological: Positive for weakness (Generalized).  All other systems reviewed and are negative.   Physical Exam Updated Vital Signs BP 139/85    Pulse 97    Temp 97.8 F (36.6 C) (Oral)    Resp (!) 25    Ht 5\' 5"  (1.651 m)    Wt 77.1 kg    SpO2 95%    BMI 28.29 kg/m   Physical Exam Vitals and nursing note reviewed.  Constitutional:      General: She is in acute distress.     Appearance:  She is well-developed. She is ill-appearing.     Comments: NRB, respiratory distress  HENT:     Head: Atraumatic.     Mouth/Throat:     Comments: Dry  mucous membranes Eyes:     Pupils: Pupils are equal, round, and reactive to light.  Cardiovascular:     Rate and Rhythm: Normal rate.     Pulses:  Radial pulses are 2+ on the right side and 2+ on the left side.       Dorsalis pedis pulses are 1+ on the right side and 1+ on the left side.       Posterior tibial pulses are 1+ on the right side and 1+ on the left side.  Pulmonary:     Effort: Tachypnea and respiratory distress present.     Breath sounds: Wheezing present.     Comments: NRB, hypoxic to 77% RA. Mild diffuse expiratory wheeze. Speaks in short sentences Abdominal:     General: Bowel sounds are normal. There is no distension.     Palpations: Abdomen is soft. There is no mass.     Tenderness: There is no abdominal tenderness. There is no guarding or rebound.  Musculoskeletal:        General: Normal range of motion.     Cervical back: Normal range of motion.     Right lower leg: No tenderness. No edema.     Left lower leg: No tenderness. No edema.     Comments: Compartments soft. Move all 4 extremities without difficulty. No bony tenderness  Skin:    General: Skin is warm and dry.     Capillary Refill: Capillary refill takes less than 2 seconds.     Comments: No edema, erythema, warmth. No fluctuance or induration   Neurological:     General: No focal deficit present.     Mental Status: She is alert and oriented to person, place, and time.    ED Results / Procedures / Treatments   Labs (all labs ordered are listed, but only abnormal results are displayed) Labs Reviewed  CBC WITH DIFFERENTIAL/PLATELET - Abnormal; Notable for the following components:      Result Value   WBC 12.4 (*)    nRBC 0.6 (*)    Neutro Abs 9.1 (*)    Monocytes Absolute 1.5 (*)    nRBC 5 (*)    All other components within normal limits   COMPREHENSIVE METABOLIC PANEL - Abnormal; Notable for the following components:   Sodium 127 (*)    Potassium 5.5 (*)    Chloride 92 (*)    Glucose, Bld 158 (*)    BUN 46 (*)    Creatinine, Ser 1.17 (*)    Calcium 8.2 (*)    AST 46 (*)    ALT 145 (*)    GFR calc non Af Amer 49 (*)    GFR calc Af Amer 56 (*)    All other components within normal limits  D-DIMER, QUANTITATIVE (NOT AT [facility]) - Abnormal; Notable for the following components:   D-Dimer, Quant 5.27 (*)    All other components within normal limits  TRIGLYCERIDES - Abnormal; Notable for the following components:   Triglycerides 151 (*)    All other components within normal limits  POTASSIUM - Abnormal; Notable for the following components:   Potassium 5.8 (*)    All other components within normal limits  TROPONIN I (HIGH SENSITIVITY) - Abnormal; Notable for the following components:   Troponin I (High Sensitivity) 79 (*)    All other components within normal limits  TROPONIN I (HIGH SENSITIVITY) - Abnormal; Notable for the following components:   Troponin I (High Sensitivity) 84 (*)    All other components within normal limits  SARS CORONAVIRUS 2 BY RT PCR (HOSPITAL ORDER, [place] [facility])  CULTURE, BLOOD (ROUTINE X 2)  CULTURE, BLOOD (ROUTINE X 2)  MRSA PCR SCREENING  EXPECTORATED SPUTUM ASSESSMENT W REFEX TO RESP CULTURE  LACTIC ACID, PLASMA  LACTIC ACID, PLASMA  PROCALCITONIN  LACTATE DEHYDROGENASE  FIBRINOGEN  FERRITIN  C-REACTIVE PROTEIN  HIV ANTIBODY (ROUTINE TESTING W REFLEX)  COMPREHENSIVE METABOLIC PANEL  CBC  HEPARIN LEVEL (UNFRACTIONATED)  CBC    EKG EKG Interpretation  Date/Time:  Wednesday October 24 2019 13:19:42 EDT Ventricular Rate:  96 PR Interval:    QRS Duration: 101 QT Interval:  300 QTC Calculation: 379 R Axis:   66 Text Interpretation: Sinus rhythm Non-specific ST-t changes Confirmed by Lajean Saver [id number]) on 10/24/2019 1:32:56 PM   Radiology CT Angio  Chest PE W and/or Wo Contrast  Result Date: 10/24/2019 CLINICAL DATA:  Chest pain. EXAM: CT ANGIOGRAPHY CHEST WITH CONTRAST TECHNIQUE: Multidetector CT imaging of the chest was performed using the standard protocol during bolus administration of intravenous contrast. Multiplanar CT image reconstructions and MIPs were obtained to evaluate the vascular anatomy. CONTRAST:  122mL OMNIPAQUE IOHEXOL 350 MG/ML SOLN COMPARISON:  February 27, 2019. FINDINGS: Cardiovascular: Atherosclerosis of thoracic aorta is noted without aneurysm or dissection. Status post coronary bypass graft. The patient is status post right upper lobectomy. There is the interval development of thrombus within the most distal portion of the stump of the right upper lobe branch of the right pulmonary artery. No definite embolus is noted. Mild cardiomegaly is noted. No pericardial effusion is noted. Mediastinum/Nodes: Moderate size sliding-type hiatal hernia is noted. Thyroid gland is unremarkable. No adenopathy is noted. Lungs/Pleura: No pneumothorax is noted. Left lung is clear. Moderate size loculated right pleural effusion is noted with associated subsegmental atelectasis of the right lower lobe. Status post right upper lobectomy as described above. Upper Abdomen: No acute abnormality. Musculoskeletal: No chest wall abnormality. No acute or significant osseous findings. Review of the MIP images confirms the above findings. IMPRESSION: 1. Status post right upper lobectomy. Interval development of thrombus within the most distal portion of the residual stump of the right upper lobe branch of the right pulmonary artery. No definite pulmonary embolus is noted. 2. Moderate size loculated right pleural effusion is noted with associated subsegmental atelectasis of the right lower lobe. 3. Moderate size sliding-type hiatal hernia. 4. Aortic atherosclerosis. Aortic Atherosclerosis (ICD10-I70.0). Electronically Signed   By: Marijo Conception M.D.   On:  10/24/2019 15:47   DG Chest Portable 1 View  Result Date: 10/24/2019 CLINICAL DATA:  Shortness of breath with fever and chills EXAM: PORTABLE CHEST 1 VIEW COMPARISON:  May 25, 2019 FINDINGS: There is a small right pleural effusion. There is postoperative change with scarring on the right. There is no edema or airspace opacity. Heart is upper normal in size with pulmonary vascularity normal. Patient is status post internal mammary bypass grafting. No adenopathy. No bone lesions. IMPRESSION: Small right pleural effusion. Scarring with volume loss on the right. No frank edema or airspace opacity. Stable cardiac silhouette. Postoperative changes noted. Electronically Signed   By: Lowella Grip III M.D.   On: 10/24/2019 13:32    Procedures .Critical Care Performed by: Nettie Elm, PA-C Authorized by: Nettie Elm, PA-C   Critical care provider statement:    Critical care time (minutes):  45   Critical care was necessary to treat or prevent imminent or life-threatening deterioration of the following conditions:  Respiratory failure and hepatic failure   Critical care was time spent personally by me on the following activities:  Discussions with consultants, evaluation of patient's response to treatment, examination  of patient, ordering and performing treatments and interventions, ordering and review of laboratory studies, ordering and review of radiographic studies, pulse oximetry, re-evaluation of patient's condition, obtaining history from patient or surrogate and review of old charts   (including critical care time)  Medications Ordered in ED Medications  aspirin chewable tablet 81 mg (has no administration in time range)  isosorbide mononitrate (IMDUR) 24 hr tablet 15 mg (has no administration in time range)  metoprolol succinate (TOPROL-XL) 24 hr tablet 50 mg (has no administration in time range)  ALPRAZolam (XANAX) tablet 0.25 mg (has no administration in time range)    pantoprazole (PROTONIX) EC tablet 40 mg (has no administration in time range)  mometasone-formoterol (DULERA) 200-5 MCG/ACT inhaler 2 puff (has no administration in time range)  ipratropium-albuterol (DUONEB) 0.5-2.5 (3) MG/3ML nebulizer solution 3 mL (has no administration in time range)  methylPREDNISolone sodium succinate (SOLU-MEDROL) 125 mg/2 mL injection 60 mg (has no administration in time range)  0.45 % sodium chloride infusion (has no administration in time range)  acetaminophen (TYLENOL) tablet 650 mg (has no administration in time range)    Or  acetaminophen (TYLENOL) suppository 650 mg (has no administration in time range)  ondansetron (ZOFRAN) tablet 4 mg (has no administration in time range)    Or  ondansetron (ZOFRAN) injection 4 mg (has no administration in time range)  albuterol (PROVENTIL) (2.5 MG/3ML) 0.083% nebulizer solution 2.5 mg (has no administration in time range)  azithromycin (ZITHROMAX) tablet 500 mg (has no administration in time range)  Ampicillin-Sulbactam (UNASYN) 3 g in sodium chloride 0.9 % 100 mL IVPB (has no administration in time range)  heparin bolus via infusion 4,000 Units (has no administration in time range)  heparin ADULT infusion 100 units/mL (25000 units/255mL sodium chloride 0.45%) (has no administration in time range)  methylPREDNISolone sodium succinate (SOLU-MEDROL) 125 mg/2 mL injection 125 mg (125 mg Intravenous Given 10/24/19 1400)  albuterol (VENTOLIN HFA) 108 (90 Base) MCG/ACT inhaler 2 puff (2 puffs Inhalation Given 10/24/19 1358)  ipratropium-albuterol (DUONEB) 0.5-2.5 (3) MG/3ML nebulizer solution 3 mL (3 mLs Nebulization Given 10/24/19 1445)  iohexol (OMNIPAQUE) 350 MG/ML injection 100 mL (100 mLs Intravenous Contrast Given 10/24/19 1520)  sodium zirconium cyclosilicate (LOKELMA) packet 10 g (10 g Oral Given 10/24/19 1719)  magnesium sulfate IVPB 2 g 50 mL (0 g Intravenous Stopped 10/24/19 1732)  sodium chloride 0.9 % bolus 500 mL (0 mLs  Intravenous Stopped 10/24/19 1841)  albuterol (PROVENTIL,VENTOLIN) solution continuous neb (7.5 mg/hr Nebulization Given 10/24/19 1805)   ED Course  I have reviewed the triage vital signs and the nursing notes.  Pertinent labs & imaging results that were available during my care of the patient were reviewed by me and considered in my medical decision making (see chart for details).  64 year old presents for evaluation of fever and shortness of breath.  Symptoms x1 week however worse over the last 24 hours.  Patient afebrile however in respiratory distress, hypoxic to 78% on room air with tachypnea.  Speaks in short sentences.  Has extensive cardiac and pulmonary history, with adenocarcinoma.  She is not vaccinated against Covid.  She does not appear grossly fluid overloaded.  She has mild diffuse expiratory wheeze.  Sepsis initial Covid test has resulted.  Plan on labs, imaging and reassess  Labs imaging personally reviewed and interpreted:  CBC with leukocytosis at 12.4, hemoglobin 23.5 Metabolic panel with mild hyponatremia to 127, hyperkalemia 5.5, hyperglycemia at 158, mildly elevated LFTs Lactic acid 1.2 Blood cultures pending Troponin  flat at 79>>>84 D-dimer 5.27 Repeat potassium 5.8. No EKG changes, given [place] Chest x-ray with small right pleural effusion, no consolidation, prior lobectomy EKG Nonspecific ST changes COVID negative CTA without PE  Patient reassessed. Now on 4 L [place] and appear comfortable. Given hx of cancer will obtained CTA chest to r/o PE or underlying infectious cause of hypoxia. Low suspicion for sepsis. Protecting airway. Hold on Abx at this time given likely COPD exacerbation. No emesis, increased sputum, low suspcion for PNA  Patient reassessed.  CTA with interval thrombus at prior surgical site however no definitive PE per radiologist.  Patient continues to require 4 L of nasal cannula.  She has had 2 rounds of albuterol as well as DuoNeb.  She has actually had  some increase in her wheeze.  Will order continuous neb and magnesium. Will likely need admission for acute hypoxic respiratory failure.  Patient with continued wheeze.  She is on 5 L via nasal cannula.  She is completing her magnesium.  I have placed orders for continuous neb.  Will need to be admitted for acute hypoxic respiratory failure question degree to COPD exacerbation versus her moderate effusion.  I discussed this with patient.  She is agreeable to admission.   I discussed CT and imaging with attending, Dr. Ashok Cordia.  He does not feel patient has an acute PE does not need to be anticoagulated at this time.  Likely thrombus from prior surgical site?  CONSULT with Dr. [place] Pink with [facility] who will evaluate patient for admission. Discussed question multifactorial etiology of acute respiratory failure vs effusion vs COPD vs underlying infection not showing on CT imaging vs elevated troponin. Patient has not had any CP. EKG reassuring. No prior Troponin's to compare in Epic. Low suspicion for NSTEMI.  Patient hemodynamically stable on 5 L of oxygen.  She is completing her magnesium and her continuous nebulizer at time of consult with hospitalist.  Patient seen eval by attending, Dr. Ashok Cordia who agrees above treatment, plan and physician.  Patient to be admitted for further work up of acute hypoxic respiratory failure    MDM Rules/Calculators/A&P                          *Emily Rocha was evaluated in Emergency Department on 10/24/2019 for the symptoms described in the history of present illness. She was evaluated in the context of the global COVID-19 pandemic, which necessitated consideration that the patient might be at risk for infection with the SARS-CoV-2 virus that causes COVID-19. Institutional protocols and algorithms that pertain to the evaluation of patients at risk for COVID-19 are in a state of rapid change based on information released by regulatory bodies including the CDC and federal and  state organizations. These policies and algorithms were followed during the patient's care in the ED. Final Clinical Impression(s) / ED Diagnoses Final diagnoses:  COPD exacerbation ([place])  Acute respiratory failure with hypoxia ([facility])  Pleural effusion  Hyperkalemia  Elevated troponin  Hyponatremia  Elevated LFTs    Rx / DC Orders ED Discharge Orders    None         Tarhonda Hollenberg A, PA-C 10/24/19 1920    Marnette Perkins A, PA-C 10/24/19 2002    Lajean Saver, MD 10/27/19 [phone]

## 2019-10-24 NOTE — ED Notes (Signed)
Pt in bed, pt had removed her nasal canula, pt satting 77% on room air, replaced nasal canula, pt O2 sat improved, explained to pt that she needs to keep the oxygen on.

## 2019-10-24 NOTE — H&P (Addendum)
[facility] History and Physical  [name] FTD:[id number] DOB: 04/04/1955 DOA: 10/24/2019   PCP: Glenda Chroman, MD  Specialists: Followed by Dr. Kipp Brood with cardiothoracic surgery.  Also followed by Dr. Vaughan Browner with pulmonology.  Followed by Dr. Harl Bowie with cardiology.  Chief Complaint: Shortness of breath  HPI: Emily Rocha is a 64 y.o. female with a past medical history of coronary artery disease status post CABG, adenocarcinoma of the lung status post VATS and right upper lobe lobectomy in September 2020, peripheral vascular disease, COPD who presented with shortness of breath.  She apparently had some fever chills over the last 1 week.  She has been having a cough with a yellowish expectoration.  She does not use oxygen at home.  Denies any chest pain but she mentions that she has been having "pleurisy" on the right side.  She denies any dizziness or lightheadedness.  None she has had some nausea and vomiting.  Denies any diarrhea.  No abdominal pain.  In the emergency department patient was found to be wheezing.  Her symptoms improved after she was given nebulizer treatments. Loculated pleural effusion was also noted.  Due to significant hypoxia she will need hospitalization for further management and assessment.  Home Medications: Prior to Admission medications   Medication Sig Start Date End Date Taking? Authorizing Provider  albuterol (PROVENTIL) (2.5 MG/3ML) 0.083% nebulizer solution Take 2.5 mg by nebulization every 4 (four) hours as needed. 03/30/19  Yes [provider]  ALPRAZolam Duanne Moron) 0.5 MG tablet Take 0.5 mg by mouth 3 (three) times daily.    Yes [provider]  Ascorbic Acid (VITAMIN C GUMMIE) 120 MG CHEW Chew 500 mg by mouth.   Yes [provider]  aspirin 81 MG chewable tablet Chew 81 mg by mouth daily.   Yes [provider]  atorvastatin (LIPITOR) 40 MG tablet Take 1 tablet (40 mg total) daily by mouth. 12/27/16  Yes Gold,  Wayne E, PA-C  calcium carbonate (OS-CAL) 1250 (500 Ca) MG chewable tablet Chew 1 tablet by mouth daily.   Yes [provider]  cholecalciferol (VITAMIN D3) 25 MCG (1000 UNIT) tablet Take 1,000 Units by mouth daily.   Yes [provider]  diphenhydramine-acetaminophen (TYLENOL PM) 25-500 MG TABS tablet Take 2-3 tablets by mouth at bedtime.   Yes [provider]  isosorbide mononitrate (IMDUR) 30 MG 24 hr tablet Take 0.5 tablets (15 mg total) by mouth daily. 03/21/19  Yes BranchAlphonse Guild, MD  metoprolol succinate (TOPROL-XL) 50 MG 24 hr tablet Take 50 mg by mouth daily. Take with or immediately following a meal.   Yes [provider]  Multiple Vitamin (MULTIVITAMIN WITH MINERALS) TABS tablet Take 1 tablet by mouth daily. 12/01/18  Yes Lars Pinks M, PA-C  pantoprazole (PROTONIX) 40 MG tablet Take 40 mg by mouth daily.   Yes [provider]  potassium chloride SA (K-DUR,KLOR-CON) 20 MEQ tablet Take 20 mEq by mouth daily.  10/20/17  Yes [provider]  Tiotropium Bromide-Olodaterol (STIOLTO RESPIMAT) 2.5-2.5 MCG/ACT AERS Inhale 2 puffs into the lungs daily. 07/19/19  Yes Mannam, Praveen, MD  torsemide (DEMADEX) 20 MG tablet Take 1 tablet (20 mg total) by mouth daily. 11/01/18  Yes Nani Skillern, PA-C    Allergies: No Known Allergies  Past Medical History: Past Medical History:  Diagnosis Date  . Anxiety   . Asthma   . COPD (chronic obstructive pulmonary disease) ([place])   . Coronary artery disease  hx CABG  . GERD (gastroesophageal reflux disease)   . Peripheral vascular disease ([place])   . Pneumonia   . Shortness of breath dyspnea   . Stroke [facility])    "mini stroke" numbness and weakness to left side of the body    Past Surgical History:  Procedure Laterality Date  . AORTIC ARCH ANGIOGRAPHY N/A 11/04/2017   Procedure: AORTIC ARCH ANGIOGRAPHY;  Surgeon: Elam Dutch, MD;  Location: [place] [facility];  Service:  Cardiovascular;  Laterality: N/A;  . APPLICATION OF WOUND VAC N/A 01/06/2018   Procedure: APPLICATION OF WOUND VAC;  Surgeon: Ivin Poot, MD;  Location: [place];  Service: Thoracic;  Laterality: N/A;  . APPLICATION OF WOUND VAC N/A 01/13/2018   Procedure: WOUND VAC CHANGE;  Surgeon: Ivin Poot, MD;  Location: [place];  Service: Thoracic;  Laterality: N/A;  . CARDIAC CATHETERIZATION    . CAROTID-SUBCLAVIAN BYPASS GRAFT Right 12/26/2017   Procedure: AORTIC TO RIGHT COMMON CAROTID AND RIGHT SUBCLAVIAN  ARTERY  BYPASS;  Surgeon: Elam Dutch, MD;  Location: [place];  Service: Vascular;  Laterality: Right;  . CHOLECYSTECTOMY    . CLOSURE OF DIAPHRAGM  12/26/2017   Procedure: REPAIR OF DIAPHRAGM;  Surgeon: Ivin Poot, MD;  Location: [place];  Service: Thoracic;;  . COLONOSCOPY N/A 10/03/2014   Procedure: COLONOSCOPY;  Surgeon: Rogene Houston, MD;  [facility];  Service: Endoscopy;  Laterality: N/A;  730  . CORONARY ARTERY BYPASS GRAFT N/A 12/14/2016   Procedure: CORONARY ARTERY BYPASS GRAFTING (CABG) x three , using left internal mammary artery and right leg greater saphenous vein harvested endoscopically;  Surgeon: Ivin Poot, MD;  Location: [place];  Service: Open Heart Surgery;  Laterality: N/A;  . ENDARTERECTOMY Right 12/06/2014   Procedure: ENDARTERECTOMY CAROTid;  Surgeon: Elam Dutch, MD;  Location: [facility];  Service: Vascular;  Laterality: Right;  . ESOPHAGOGASTRODUODENOSCOPY N/A 12/03/2016   Procedure: ESOPHAGOGASTRODUODENOSCOPY (EGD);  Surgeon: Rogene Houston, MD;  [facility];  Service: Endoscopy;  Laterality: N/A;  7:30  . LEFT HEART CATH AND CORONARY ANGIOGRAPHY N/A 12/14/2016   Procedure: LEFT HEART CATH AND CORONARY ANGIOGRAPHY;  Surgeon: Burnell Blanks, MD;  Location: [place] [facility];  Service: Cardiovascular;  Laterality: N/A;  . LOBECTOMY Right 10/26/2018   Procedure: RIGHT UPPER LOBECTOMY;  Surgeon: Lajuana Matte,  MD;  Location: [place];  Service: Thoracic;  Laterality: Right;  . PERIPHERAL VASCULAR CATHETERIZATION N/A 12/05/2014   Procedure:  Carotid Arch Angiography;  Surgeon: Conrad New Bedford, MD;  Location: [place] [facility];  Service: Cardiovascular;  Laterality: N/A;  . STERNAL WOUND DEBRIDEMENT N/A 01/06/2018   Procedure: STERNAL WOUND DEBRIDEMENT;  Surgeon: Ivin Poot, MD;  Location: [place];  Service: Thoracic;  Laterality: N/A;  . STERNOTOMY N/A 12/26/2017   Procedure: REDO STERNOTOMY;  Surgeon: Prescott Gum, Collier Salina, MD;  Location: [place];  Service: Thoracic;  Laterality: N/A;  . TEE WITHOUT CARDIOVERSION N/A 12/14/2016   Procedure: TRANSESOPHAGEAL ECHOCARDIOGRAM (TEE);  Surgeon: Prescott Gum, Collier Salina, MD;  Location: [place];  Service: Open Heart Surgery;  Laterality: N/A;  . VIDEO ASSISTED THORACOSCOPY (VATS)/WEDGE RESECTION Right 10/26/2018   Procedure: VIDEO ASSISTED THORACOSCOPY (VATS)/WEDGE RESECTION;  Surgeon: Lajuana Matte, MD;  Location: [place];  Service: Thoracic;  Laterality: Right;  Marland Kitchen VIDEO BRONCHOSCOPY N/A 10/26/2018   Procedure: VIDEO BRONCHOSCOPY;  Surgeon: Lajuana Matte, MD;  Location: [place];  Service: Thoracic;  Laterality: N/A;  Social History: Lives with her husband.  Does not smoke currently.  No alcohol use.  No illicit drug use.  Family History:  Family History  Problem Relation Age of Onset  . Heart attack Mother   . Cancer Mother   . Diabetes Mother   . Heart disease Mother   . Heart attack Father   . Heart disease Father      Review of Systems - History obtained from the patient General ROS: positive for  - fatigue and fever Psychological ROS: negative Ophthalmic ROS: negative ENT ROS: negative Allergy and Immunology ROS: negative Hematological and Lymphatic ROS: negative Endocrine ROS: negative Respiratory ROS: As in HPI Cardiovascular ROS: no chest pain or dyspnea on exertion Gastrointestinal ROS: As in HPI Genito-Urinary ROS: no dysuria, trouble  voiding, or hematuria Musculoskeletal ROS: negative Neurological ROS: no TIA or stroke symptoms Dermatological ROS: negative  Physical Examination  Vitals:   10/24/19 2000 10/24/19 2030 10/24/19 2031 10/24/19 2032  BP: (!) 161/85 (!) 143/75    Pulse: (!) 104 98    Resp: (!) 30 (!) 24    Temp:      TempSrc:      SpO2: 95% 91% 92% 94%  Weight:      Height:        BP (!) 143/75   Pulse 98   Temp 97.8 F (36.6 C) (Oral)   Resp (!) 24   Ht 5\' 5"  (1.651 m)   Wt 77.1 kg   SpO2 94%   BMI 28.29 kg/m   General appearance: alert, cooperative, appears stated age and no distress Head: Normocephalic, without obvious abnormality, atraumatic Eyes: conjunctivae/corneas clear. PERRL, EOM's intact.  Throat: lips, mucosa, and tongue normal; teeth and gums normal Neck: no adenopathy, no carotid bruit, no JVD, supple, symmetrical, trachea midline and thyroid not enlarged, symmetric, no tenderness/mass/nodules Resp: Noted to be tachypneic.  No use of accessory muscles.  Crackles bilateral bases.  Diminished air entry on the right.  Dullness to percussion.  Scattered wheezes.  No rhonchi. Cardio: regular rate and rhythm, S1, S2 normal, no murmur, click, rub or gallop GI: soft, non-tender; bowel sounds normal; no masses,  no organomegaly Extremities: edema Mild edema noted bilateral lower extremities Pulses: 2+ and symmetric Skin: Skin color, texture, turgor normal. No rashes or lesions Lymph nodes: Cervical, supraclavicular, and axillary nodes normal. Neurologic: Alert and oriented x3.  No obvious focal neurological deficits noted.   Labs on Admission: I have personally reviewed following labs and imaging studies  CBC: Recent Labs  Lab 10/24/19 1316  WBC 12.4*  NEUTROABS 9.1*  HGB 12.1  HCT 39.7  MCV 89.4  PLT 867   Basic Metabolic Panel: Recent Labs  Lab 10/24/19 1316  NA 127*  K 5.8*  5.5*  CL 92*  CO2 27  GLUCOSE 158*  BUN 46*  CREATININE 1.17*  CALCIUM 8.2*    GFR: Estimated Creatinine Clearance: 48.5 mL/min (A) (by C-G formula based on SCr of 1.17 mg/dL (H)). Liver Function Tests: Recent Labs  Lab 10/24/19 1316  AST 46*  ALT 145*  ALKPHOS 114  BILITOT 1.1  PROT 6.7  ALBUMIN 3.6   Lipid Profile: Recent Labs    10/24/19 1316  TRIG 151*     Radiological Exams on Admission: CT Angio Chest PE W and/or Wo Contrast  Result Date: 10/24/2019 CLINICAL DATA:  Chest pain. EXAM: CT ANGIOGRAPHY CHEST WITH CONTRAST TECHNIQUE: Multidetector CT imaging of the chest was performed using the standard protocol during bolus  administration of intravenous contrast. Multiplanar CT image reconstructions and MIPs were obtained to evaluate the vascular anatomy. CONTRAST:  182mL OMNIPAQUE IOHEXOL 350 MG/ML SOLN COMPARISON:  February 27, 2019. FINDINGS: Cardiovascular: Atherosclerosis of thoracic aorta is noted without aneurysm or dissection. Status post coronary bypass graft. The patient is status post right upper lobectomy. There is the interval development of thrombus within the most distal portion of the stump of the right upper lobe branch of the right pulmonary artery. No definite embolus is noted. Mild cardiomegaly is noted. No pericardial effusion is noted. Mediastinum/Nodes: Moderate size sliding-type hiatal hernia is noted. Thyroid gland is unremarkable. No adenopathy is noted. Lungs/Pleura: No pneumothorax is noted. Left lung is clear. Moderate size loculated right pleural effusion is noted with associated subsegmental atelectasis of the right lower lobe. Status post right upper lobectomy as described above. Upper Abdomen: No acute abnormality. Musculoskeletal: No chest wall abnormality. No acute or significant osseous findings. Review of the MIP images confirms the above findings. IMPRESSION: 1. Status post right upper lobectomy. Interval development of thrombus within the most distal portion of the residual stump of the right upper lobe branch of the right  pulmonary artery. No definite pulmonary embolus is noted. 2. Moderate size loculated right pleural effusion is noted with associated subsegmental atelectasis of the right lower lobe. 3. Moderate size sliding-type hiatal hernia. 4. Aortic atherosclerosis. Aortic Atherosclerosis (ICD10-I70.0). Electronically Signed   By: Marijo Conception M.D.   On: 10/24/2019 15:47   DG Chest Portable 1 View  Result Date: 10/24/2019 CLINICAL DATA:  Shortness of breath with fever and chills EXAM: PORTABLE CHEST 1 VIEW COMPARISON:  May 25, 2019 FINDINGS: There is a small right pleural effusion. There is postoperative change with scarring on the right. There is no edema or airspace opacity. Heart is upper normal in size with pulmonary vascularity normal. Patient is status post internal mammary bypass grafting. No adenopathy. No bone lesions. IMPRESSION: Small right pleural effusion. Scarring with volume loss on the right. No frank edema or airspace opacity. Stable cardiac silhouette. Postoperative changes noted. Electronically Signed   By: Lowella Grip III M.D.   On: 10/24/2019 13:32    My interpretation of Electrocardiogram: Sinus rhythm in the 90s.  Normal axis.  Intervals are normal.  No concerning ST changes.  T inversion noted in 1 and aVL which was seen previously as well.   Problem List  Principal Problem:   Acute respiratory failure with hypoxia (HCC) Active Problems:   Pneumonia   Pleural effusion   History of lung cancer   CAD (coronary artery disease)   Acute respiratory failure (HCC)   Assessment: This is a 64 year old Caucasian female who comes in with shortness of breath.  She is found to have acute respiratory failure with hypoxia.  She appears to have a loculated pleural effusion on the right.  She has had subjective fevers at home.  She is found to have leukocytosis.  She is found to have interval development of a thrombus within the distal portion of the right upper lobe branch of the right  pulmonary artery.  Mildly elevated troponin level is noted.  COVID-19 PCR was negative.  Plan:  1. Acute respiratory failure with hypoxia with concern for pneumonia due to subjective fevers and leukocytosis/possible COPD exacerbation: Patient has had a few episodes of vomiting.  Aspiration is a consideration.  Check MRSA PCR.  Place her on Unasyn and azithromycin for now.  Follow-up on blood cultures.  She was found to  be wheezing in the emergency department improved with nebulizer treatments.  She was given steroids which we will continue for now.  Inhalers.  Nebulizer treatments.  Currently on 5 L of oxygen by nasal cannula saturating in the early 90s.  2.  Loculated pleural effusion: Will consult pulmonology in the morning to assist with management.  Will order ultrasound-guided thoracentesis to see if this fluid can be tapped.  Fluid will need to be sent for analysis and cultures.   3.  Finding of thrombus in the distal portion of the right upper lobe branch of the right coronary artery: Discussed with radiology again and they do confirm this finding.  No PE noted however.  This thrombus is new compared to previous images.  Clinical significance is unknown.  Will request pulmonology to weigh in.  In the meantime we will place her on IV heparin.  She does not have any bleeding.  No contraindication to anticoagulation at this time.  4.  Elevated troponin in the setting of known coronary artery disease/aortic stenosis: Likely due to demand.  EKG shows stable findings.  Consult cardiology to assist.  Continue with her home medications.  5. Hyponatremia with hyperkalemia and acute kidney injury: Likely due to hypovolemia.  We will give her gentle IV hydration.  She was given [facility] by the ED provider.  We will recheck labs in the morning.  Monitor urine output.  Avoid nephrotoxic agents.  6.  Transaminitis: Hold her statin.  LFTs are noted to be slightly elevated back in 2020 as well.   DVT  Prophylaxis: On IV heparin for now Code Status: Full code Family Communication: Discussed with the patient Disposition: Hopefully return home in improved Consults called: Pulmonology and cardiology Admission Status: Status is: Inpatient  Remains inpatient appropriate because:IV treatments appropriate due to intensity of illness or inability to take PO and Inpatient level of care appropriate due to severity of illness   Dispo: The patient is from: Home              Anticipated d/c is to: Home              Anticipated d/c date is: 3 days              Patient currently is not medically stable to d/c.   Severity of Illness: The appropriate patient status for this patient is INPATIENT. Inpatient status is judged to be reasonable and necessary in order to provide the required intensity of service to ensure the patient's safety. The patient's presenting symptoms, physical exam findings, and initial radiographic and laboratory data in the context of their chronic comorbidities is felt to place them at high risk for further clinical deterioration. Furthermore, it is not anticipated that the patient will be medically stable for discharge from the hospital within 2 midnights of admission. The following factors support the patient status of inpatient.   " The patient's presenting symptoms include shortness of breath. " The worrisome physical exam findings include hypoxia. " The initial radiographic and laboratory data are worrisome because of pleural effusion. " The chronic co-morbidities include coronary artery disease.   * I certify that at the point of admission it is my clinical judgment that the patient will require inpatient hospital care spanning beyond 2 midnights from the point of admission due to high intensity of service, high risk for further deterioration and high frequency of surveillance required.*  Further management decisions will depend on results of further testing and patient's  response  to treatment.   Shaterra Sanzone [employer]  [facility] operational officer on [email or web address]  10/24/2019, 8:51 PM

## 2019-10-24 NOTE — ED Notes (Signed)
Pt to er room number 11, pt c/o sob, pt has slight bilateral wheezes, pt satting 78% on room air, pt placed on 15L NRB, pt satting in upper 90s on NRB,

## 2019-10-24 NOTE — Progress Notes (Signed)
ANTICOAGULATION CONSULT NOTE - Initial Consult  Pharmacy Consult for Heparin + Unasyn Indication: elevated toponins + thrombus on CT + aspiration  No Known Allergies  Patient Measurements: Height: 5\' 5"  (165.1 cm) Weight: 77.1 kg (170 lb) IBW/kg (Calculated) : 57 Heparin Dosing Weight: 73 kg  Vital Signs: Temp: 97.8 F (36.6 C) (09/08 1841) Temp Source: Oral (09/08 1841) BP: 139/85 (09/08 1900) Pulse Rate: 97 (09/08 1900)  Labs: Recent Labs    10/24/19 1316 10/24/19 1655  HGB 12.1  --   HCT 39.7  --   PLT 224  --   CREATININE 1.17*  --   TROPONINIHS 79* 84*    Estimated Creatinine Clearance: 48.5 mL/min (A) (by C-G formula based on SCr of 1.17 mg/dL (H)).   Medical History: Past Medical History:  Diagnosis Date  . Anxiety   . Asthma   . COPD (chronic obstructive pulmonary disease) ([place])   . Coronary artery disease    hx CABG  . GERD (gastroesophageal reflux disease)   . Peripheral vascular disease ([place])   . Pneumonia   . Shortness of breath dyspnea   . Stroke [facility])    "mini stroke" numbness and weakness to left side of the body   Assessment: CC/HPI: SOB, fever, chills, SOB, hypoxia but COVID is negative  PMH: COPD, CAD s/p CABG 2018, PVD, Adenocarcinoma right upper lobe s/p R VATS right upper lobe lobectomy 10/2018, anxiety, asthma, GERD, TIA/CVA, DVT 12/19, HF, carotid stenosis, anemia,   Anticoag: Start IV heparin for elevated troponins and thrombus on CT (not PE). Hgb 12.1. Ddimer 5.27.  - 9/8 CT: s/p RU lobectomy. Interval development of thrombus within the most distal portion of the residual stump of the right upper lobe branch of the right pulmonary artery. No definite pulmonary embolus is noted.  ID: COVID negative. WBC 12.4 but LA and PC WNL Unasyn 9/8>> Azithro 9/8>>  Goal of Therapy:  Heparin level 0.3-0.7 units/ml Monitor platelets by anticoagulation protocol: Yes   Plan:  Heparin 4000 unit bolus and infusion at 1200 units/hr Daily HL  and CBC Unasyn 3g IV q8hr Azithro 500mg  po x 5d   Fadi Menter S. Alford Highland, PharmD, BCPS Clinical Staff Pharmacist [email or web address] Alford Highland, Audelia Knape Stillinger 10/24/2019,7:49 PM

## 2019-10-25 ENCOUNTER — Encounter (HOSPITAL_COMMUNITY): Payer: Self-pay | Admitting: Internal Medicine

## 2019-10-25 ENCOUNTER — Inpatient Hospital Stay (HOSPITAL_COMMUNITY): Payer: Medicare HMO

## 2019-10-25 ENCOUNTER — Encounter (HOSPITAL_COMMUNITY): Payer: Medicare HMO

## 2019-10-25 DIAGNOSIS — J90 Pleural effusion, not elsewhere classified: Secondary | ICD-10-CM

## 2019-10-25 DIAGNOSIS — J189 Pneumonia, unspecified organism: Secondary | ICD-10-CM

## 2019-10-25 DIAGNOSIS — J9601 Acute respiratory failure with hypoxia: Principal | ICD-10-CM

## 2019-10-25 LAB — BLOOD GAS, ARTERIAL
Acid-Base Excess: 2.9 mmol/L — ABNORMAL HIGH (ref 0.0–2.0)
Bicarbonate: 25.7 mmol/L (ref 20.0–28.0)
FIO2: 38
O2 Saturation: 96.6 %
Patient temperature: 37
pCO2 arterial: 67 mmHg (ref 32.0–48.0)
pH, Arterial: 7.264 — ABNORMAL LOW (ref 7.350–7.450)
pO2, Arterial: 93 mmHg (ref 83.0–108.0)

## 2019-10-25 LAB — COMPREHENSIVE METABOLIC PANEL
ALT: 123 U/L — ABNORMAL HIGH (ref 0–44)
AST: 33 U/L (ref 15–41)
Albumin: 3.7 g/dL (ref 3.5–5.0)
Alkaline Phosphatase: 108 U/L (ref 38–126)
Anion gap: 10 (ref 5–15)
BUN: 33 mg/dL — ABNORMAL HIGH (ref 8–23)
CO2: 27 mmol/L (ref 22–32)
Calcium: 8.4 mg/dL — ABNORMAL LOW (ref 8.9–10.3)
Chloride: 92 mmol/L — ABNORMAL LOW (ref 98–111)
Creatinine, Ser: 0.7 mg/dL (ref 0.44–1.00)
GFR calc Af Amer: >60 mL/min (ref >60)
GFR calc non Af Amer: >60 mL/min (ref >60)
Glucose, Bld: 154 mg/dL — ABNORMAL HIGH (ref 70–99)
Potassium: 5.7 mmol/L — ABNORMAL HIGH (ref 3.5–5.1)
Sodium: 129 mmol/L — ABNORMAL LOW (ref 135–145)
Total Bilirubin: 0.8 mg/dL (ref 0.3–1.2)
Total Protein: 7.2 g/dL (ref 6.5–8.1)

## 2019-10-25 LAB — CBC
HCT: 39 % (ref 36.0–46.0)
Hemoglobin: 11.7 g/dL — ABNORMAL LOW (ref 12.0–15.0)
MCH: 27.4 pg (ref 26.0–34.0)
MCHC: 30 g/dL (ref 30.0–36.0)
MCV: 91.3 fL (ref 80.0–100.0)
Platelets: 215 10*3/uL (ref 150–400)
RBC: 4.27 MIL/uL (ref 3.87–5.11)
RDW: 15.3 % (ref 11.5–15.5)
WBC: 12.3 10*3/uL — ABNORMAL HIGH (ref 4.0–10.5)
nRBC: 6.2 % — ABNORMAL HIGH (ref 0.0–0.2)

## 2019-10-25 LAB — HIV ANTIBODY (ROUTINE TESTING W REFLEX): HIV Screen 4th Generation wRfx: NONREACTIVE

## 2019-10-25 LAB — SEDIMENTATION RATE: Sed Rate: 5 mm/hr (ref 0–22)

## 2019-10-25 LAB — BRAIN NATRIURETIC PEPTIDE: B Natriuretic Peptide: 905 pg/mL — ABNORMAL HIGH (ref 0.0–100.0)

## 2019-10-25 LAB — MRSA PCR SCREENING: MRSA by PCR: POSITIVE — AB

## 2019-10-25 LAB — HEPARIN LEVEL (UNFRACTIONATED)
Heparin Unfractionated: 1.1 IU/mL — ABNORMAL HIGH (ref 0.30–0.70)
Heparin Unfractionated: 0.7 IU/mL (ref 0.30–0.70)
Heparin Unfractionated: 0.63 IU/mL (ref 0.30–0.70)

## 2019-10-25 MED ORDER — ALPRAZOLAM 0.5 MG PO TABS
0.5000 mg | ORAL_TABLET | Freq: Once | ORAL | Status: AC
  Administered 2019-10-25: 0.5 mg via ORAL
  Filled 2019-10-25: qty 1

## 2019-10-25 MED ORDER — CHLORHEXIDINE GLUCONATE CLOTH 2 % EX PADS
6.0000 | MEDICATED_PAD | Freq: Every day | CUTANEOUS | Status: AC
  Administered 2019-10-26 – 2019-10-29 (×3): 6 via TOPICAL

## 2019-10-25 MED ORDER — MUPIROCIN 2 % EX OINT
1.0000 "application " | TOPICAL_OINTMENT | Freq: Two times a day (BID) | CUTANEOUS | Status: AC
  Administered 2019-10-25 – 2019-10-30 (×10): 1 via NASAL
  Filled 2019-10-25 (×4): qty 22

## 2019-10-25 MED ORDER — CHLORHEXIDINE GLUCONATE CLOTH 2 % EX PADS
6.0000 | MEDICATED_PAD | Freq: Every day | CUTANEOUS | Status: DC
  Administered 2019-10-25 – 2019-11-06 (×10): 6 via TOPICAL

## 2019-10-25 MED ORDER — SODIUM ZIRCONIUM CYCLOSILICATE 5 G PO PACK
10.0000 g | PACK | Freq: Once | ORAL | Status: AC
  Administered 2019-10-25: 10 g via ORAL
  Filled 2019-10-25: qty 2

## 2019-10-25 MED ORDER — METHYLPREDNISOLONE SODIUM SUCC 40 MG IJ SOLR
40.0000 mg | Freq: Two times a day (BID) | INTRAMUSCULAR | Status: DC
  Administered 2019-10-25 – 2019-10-28 (×7): 40 mg via INTRAVENOUS
  Filled 2019-10-25 (×6): qty 1

## 2019-10-25 MED ORDER — HALOPERIDOL LACTATE 5 MG/ML IJ SOLN
5.0000 mg | Freq: Once | INTRAMUSCULAR | Status: AC
  Administered 2019-10-25: 5 mg via INTRAMUSCULAR
  Filled 2019-10-25: qty 1

## 2019-10-25 MED ORDER — VANCOMYCIN HCL IN DEXTROSE 1-5 GM/200ML-% IV SOLN
1000.0000 mg | Freq: Two times a day (BID) | INTRAVENOUS | Status: DC
  Administered 2019-10-26 – 2019-11-02 (×15): 1000 mg via INTRAVENOUS
  Filled 2019-10-25 (×16): qty 200

## 2019-10-25 MED ORDER — SODIUM ZIRCONIUM CYCLOSILICATE 10 G PO PACK
10.0000 g | PACK | Freq: Once | ORAL | Status: DC
  Filled 2019-10-25: qty 1

## 2019-10-25 MED ORDER — LORAZEPAM 2 MG/ML IJ SOLN: 1.0000 mg | INTRAMUSCULAR | Status: AC

## 2019-10-25 MED ORDER — HALOPERIDOL LACTATE 5 MG/ML IJ SOLN
5.0000 mg | Freq: Four times a day (QID) | INTRAMUSCULAR | Status: DC | PRN
  Administered 2019-10-26 – 2019-10-28 (×3): 5 mg via INTRAMUSCULAR
  Filled 2019-10-25 (×3): qty 1

## 2019-10-25 MED ORDER — VANCOMYCIN HCL 2000 MG/400ML IV SOLN
2000.0000 mg | Freq: Once | INTRAVENOUS | Status: AC
  Administered 2019-10-25: 2000 mg via INTRAVENOUS
  Filled 2019-10-25: qty 400

## 2019-10-25 MED ORDER — ALPRAZOLAM 0.5 MG PO TABS
0.5000 mg | ORAL_TABLET | Freq: Two times a day (BID) | ORAL | Status: DC | PRN
  Administered 2019-10-25 – 2019-10-26 (×3): 0.5 mg via ORAL
  Filled 2019-10-25 (×3): qty 1

## 2019-10-25 MED ORDER — LORAZEPAM 2 MG/ML IJ SOLN
1.0000 mg | Freq: Once | INTRAMUSCULAR | Status: AC
  Administered 2019-10-25: 1 mg via INTRAVENOUS

## 2019-10-25 MED ORDER — LORAZEPAM 2 MG/ML IJ SOLN
INTRAMUSCULAR | Status: AC
  Filled 2019-10-25: qty 1

## 2019-10-25 NOTE — Progress Notes (Signed)
ANTICOAGULATION CONSULT NOTE -   Pharmacy Consult for heparin IV Indication: Elevated troponin  No Known Allergies  Patient Measurements: Height: 5\' 5"  (165.1 cm) Weight: 89.3 kg (196 lb 13.9 oz) IBW/kg (Calculated) : 57 Heparin Dosing Weight: 73 kg  Vital Signs: Temp: 97.9 F (36.6 C) (09/09 1632) Temp Source: Axillary (09/09 1632) BP: 147/73 (09/09 1900) Pulse Rate: 88 (09/09 1900)  Labs: Recent Labs    10/24/19 1316 10/24/19 1655 10/25/19 0352 10/25/19 1348 10/25/19 2133  HGB 12.1  --  11.7*  --   --   HCT 39.7  --  39.0  --   --   PLT 224  --  215  --   --   HEPARINUNFRC  --   --  1.10* 0.70 0.63  CREATININE 1.17*  --  0.70  --   --   TROPONINIHS 79* 84*  --   --   --     Estimated Creatinine Clearance: 76.3 mL/min (by C-G formula based on SCr of 0.7 mg/dL).   Medical History: Past Medical History:  Diagnosis Date  . Anxiety   . Asthma   . COPD (chronic obstructive pulmonary disease) ([place])   . Coronary artery disease    hx CABG  . GERD (gastroesophageal reflux disease)   . Peripheral vascular disease ([place])   . Pneumonia   . Shortness of breath dyspnea   . Stroke [facility])    "mini stroke" numbness and weakness to left side of the body    Medications:  Medications Prior to Admission  Medication Sig Dispense Refill Last Dose  . albuterol (PROVENTIL) (2.5 MG/3ML) 0.083% nebulizer solution Take 2.5 mg by nebulization every 4 (four) hours as needed.     . ALPRAZolam (XANAX) 0.5 MG tablet Take 0.5 mg by mouth 3 (three) times daily.    10/23/2019 at Unknown time  . Ascorbic Acid (VITAMIN C GUMMIE) 120 MG CHEW Chew 500 mg by mouth.   10/23/2019 at Unknown time  . aspirin 81 MG chewable tablet Chew 81 mg by mouth daily.   10/23/2019 at 0900  . atorvastatin (LIPITOR) 40 MG tablet Take 1 tablet (40 mg total) daily by mouth. 30 tablet 1 10/23/2019 at Unknown time  . calcium carbonate (OS-CAL) 1250 (500 Ca) MG chewable tablet Chew 1 tablet by mouth daily.   10/23/2019 at  Unknown time  . cholecalciferol (VITAMIN D3) 25 MCG (1000 UNIT) tablet Take 1,000 Units by mouth daily.   10/23/2019 at Unknown time  . diphenhydramine-acetaminophen (TYLENOL PM) 25-500 MG TABS tablet Take 2-3 tablets by mouth at bedtime.   10/23/2019 at Unknown time  . isosorbide mononitrate (IMDUR) 30 MG 24 hr tablet Take 0.5 tablets (15 mg total) by mouth daily. 45 tablet 3 10/23/2019 at Unknown time  . metoprolol succinate (TOPROL-XL) 50 MG 24 hr tablet Take 50 mg by mouth daily. Take with or immediately following a meal.   10/23/2019 at 0900  . Multiple Vitamin (MULTIVITAMIN WITH MINERALS) TABS tablet Take 1 tablet by mouth daily.   10/23/2019 at Unknown time  . pantoprazole (PROTONIX) 40 MG tablet Take 40 mg by mouth daily.   10/23/2019 at Unknown time  . potassium chloride SA (K-DUR,KLOR-CON) 20 MEQ tablet Take 20 mEq by mouth daily.   2 10/23/2019 at Unknown time  . Tiotropium Bromide-Olodaterol (STIOLTO RESPIMAT) 2.5-2.5 MCG/ACT AERS Inhale 2 puffs into the lungs daily. 4 g 4 10/23/2019 at Unknown time  . torsemide (DEMADEX) 20 MG tablet Take 1 tablet (20 mg total)  by mouth daily.   10/23/2019 at Unknown time    Assessment: IV heparin for elevated troponins and thrombus on CT (not PE). Hgb 12.1. Ddimer 5.27.  - 9/8 CT: s/p RU lobectomy. Interval development of thrombus within the most distal portion of the residual stump of the right upper lobe branch of the right pulmonary artery. No definite pulmonary embolus is noted.  -heparin level at goal  Goal of Therapy:  Heparin level 0.3-0.7 units/ml Monitor platelets by anticoagulation protocol: Yes   Plan:  No heparin changes needed Daily heparin level  Hildred Laser, PharmD Clinical Pharmacist **Pharmacist phone directory can now be found on [email or web address] ([email or web address]).  Listed under [place].

## 2019-10-25 NOTE — Progress Notes (Signed)
  CT surgery as reviewed patient's chest imaging studies on chart  Dr. Kipp Brood  (cardiothoracic surgeon ) said Emily Rocha  can be transferred under hospitalist to [facility] and he will see her once she arrives. He's on call all weekend so he will be around.   [name], MD

## 2019-10-25 NOTE — Consult Note (Signed)
NAME:  Emily Rocha, MRN:  [id number], DOB:  Jul 30, 1955, LOS: 1 ADMISSION DATE:  10/24/2019, CONSULTATION DATE:  10/25/19 REFERRING MD:  [facility] , CHIEF COMPLAINT:  R cp/ sob    Brief History   89 yowf quit smoking 4 y PTA with dx GOLD III COPD , T1 cN0 M0 adenocarcinoma the right upper lobe  s/p R VATS, RULectomy on 9/10/2020admitted with new R intensely plueuritic cp x 3 weeks assoc with  Progressive doe and new dx of loculated R pl effusion resp failure so admitted to ICU and PCCM service asked to see   History of present illness   Emily Rocha is a 64 y.o. female with a past medical history of coronary artery disease status post CABG, adenocarcinoma of the lung status post VATS and right upper lobe lobectomy in September 2020, peripheral vascular disease, COPD who presented with shortness of breath.  She apparently had some fever chills over the last 1 week.  She has been having a cough with a yellowish expectoration.  She does not use oxygen at home.  Denies any chest pain but she mentions that she has been having "pleurisy" on the right side.    In the emergency department patient was found to be wheezing.  Her symptoms improved after she was given nebulizer treatments. Loculated pleural effusion was also noted.  Due to significant hypoxia she will need hospitalization for further management and assessment.   Past Medical History  GOLD 3 COPD  CAD s/p cabg RULobectromy Sept 2020   Significant Hospital Events      Consults:  PCCM  Procedures:  Attempted u/s t centesis 9/8 not feasible  Significant Diagnostic Tests:  Ecjp 03/27/55 GI diastolic dysfunction , mild mr, mod AS with mean gradient 27 and mildLAE   Micro Data:  BC x  2  9/8 >>> COVID PCR 9/8  Neg MRSA PCR  9/9/  POS   Antimicrobials:  unasyn  9/8 >>> Zmax 9/8 Vanc 9/9 >>>   Scheduled Meds: . aspirin  81 mg Oral Daily  . azithromycin  500 mg Oral Daily  . Chlorhexidine Gluconate Cloth  6 each Topical Daily  .  ipratropium-albuterol  3 mL Nebulization QID  . isosorbide mononitrate  15 mg Oral Daily  . methylPREDNISolone (SOLU-MEDROL) injection  60 mg Intravenous Q12H  . metoprolol succinate  50 mg Oral Daily  . mometasone-formoterol  2 puff Inhalation BID  . pantoprazole  40 mg Oral Daily   Continuous Infusions: . ampicillin-sulbactam (UNASYN) IV 3 g (10/25/19 1330)  . heparin 1,050 Units/hr (10/25/19 1525)   PRN Meds:.acetaminophen **OR** acetaminophen, albuterol, ALPRAZolam, ALPRAZolam, haloperidol lactate, ondansetron **OR** ondansetron (ZOFRAN) IV   Interim history/subjective:  Intermittently agitated   Objective   Blood pressure 126/74, pulse 87, temperature 98.4 F (36.9 C), temperature source Oral, resp. rate (!) 27, height 5' 5"  (1.651 m), weight 89.3 kg, SpO2 100 %.        Intake/Output Summary (Last 24 hours) at 10/25/2019 1613 Last data filed at 10/24/2019 2109 Gross per 24 hour  Intake 650.22 ml  Output --  Net 650.22 ml   Filed Weights   10/24/19 1306 10/25/19 1114  Weight: 77.1 kg 89.3 kg    Examination: General: Tmax 98.7 HENT: nl Lungs: decreased bs on L Cardiovascular: RRR 3/6 sem Abdomen: sof Extremities: warm Neuro: agitated, hard of hearing so hard to tell re AMS      I personally reviewed images and agree with radiology impression as  follows:   Chest CTa 1. Status post right upper lobectomy. Interval development of thrombus within the most distal portion of the residual stump of the right upper lobe branch of the right pulmonary artery. No definite pulmonary embolus is noted. 2. Moderate size loculated right pleural effusion is noted with associated subsegmental atelectasis of the right lower lobe. 3. Moderate size sliding-type hiatal hernia. 4. Aortic atherosclerosis.    Resolved Hospital Problem list      Assessment & Plan:   1) new intense pleuritic cp assoc with loculated effusion s/p RULobectomy for Stage I lung ca ddx = malignant effusion  vs  empyema  From ? Asp or may involve  MRSA since nasal pcr pos > add vanc/ check ESR  ? In setting of CAP/ asp > unsyn/ zmax good emprical rx  2) acute resp failure hypercabic/hypoxemic assoc with I  Plus GOLD 3 copd  >> rx 02 and nebs, minimize steroids   3) hyponatremia/ hyperkalemia on supplemental K with good renal function >>> should correct with NS   4) Mod severe AS so may not tol much sepsis should this turn out to be an infectious source  Since can't access the source of possible infection or rule out recurrent infection with IR here, rec transfer to [facility] for T surgery eval.   Best practice:  Diet: per  [facility]  Pain/Anxiety/Delirium protocol (if indicated): per triad VAP protocol (if indicated): n/a DVT prophylaxis: per triad  GI prophylaxis: per triad  Glucose control: per triad Mobility: up as tol  Code Status: Full code  Family Communication: direct to pt  Disposition: to [facility] when bed avail for T surgery eval (has seen [facility])   Labs   CBC: Recent Labs  Lab 10/24/19 1316 10/25/19 0352  WBC 12.4* 12.3*  NEUTROABS 9.1*  --   HGB 12.1 11.7*  HCT 39.7 39.0  MCV 89.4 91.3  PLT 224 786    Basic Metabolic Panel: Recent Labs  Lab 10/24/19 1316 10/25/19 0352  NA 127* 129*  K 5.8*  5.5* 5.7*  CL 92* 92*  CO2 27 27  GLUCOSE 158* 154*  BUN 46* 33*  CREATININE 1.17* 0.70  CALCIUM 8.2* 8.4*   GFR: Estimated Creatinine Clearance: 76.3 mL/min (by C-G formula based on SCr of 0.7 mg/dL). Recent Labs  Lab 10/24/19 1316 10/24/19 1357 10/24/19 1655 10/25/19 0352  PROCALCITON 0.15  --   --   --   WBC 12.4*  --   --  12.3*  LATICACIDVEN  --  1.2 1.1  --     Liver Function Tests: Recent Labs  Lab 10/24/19 1316 10/25/19 0352  AST 46* 33  ALT 145* 123*  ALKPHOS 114 108  BILITOT 1.1 0.8  PROT 6.7 7.2  ALBUMIN 3.6 3.7   No results for input(s): LIPASE, AMYLASE in the last 168 hours. No results for input(s): AMMONIA in the last 168  hours.  ABG    Component Value Date/Time   PHART 7.264 (L) 10/25/2019 1324   PCO2ART 67.0 (HH) 10/25/2019 1324   PO2ART 93.0 10/25/2019 1324   HCO3 25.7 10/25/2019 1324   TCO2 30 10/26/2018 1241   O2SAT 96.6 10/25/2019 1324     Coagulation Profile: No results for input(s): INR, PROTIME in the last 168 hours.  Cardiac Enzymes: No results for input(s): CKTOTAL, CKMB, CKMBINDEX, TROPONINI in the last 168 hours.  HbA1C: Hgb A1c MFr Bld  Date/Time Value Ref Range Status  12/22/2017 03:35 PM 6.2 (H)  4.8 - 5.6 % Final    Comment:    (NOTE) Pre diabetes:          5.7%-6.4% Diabetes:              >6.4% Glycemic control for   <7.0% adults with diabetes   12/20/2016 03:04 PM 6.0 (H) 4.8 - 5.6 % Final    Comment:    (NOTE) Pre diabetes:          5.7%-6.4% Diabetes:              >6.4% Glycemic control for   <7.0% adults with diabetes     CBG: No results for input(s): GLUCAP in the last 168 hours.     Past Medical History  She,  has a past medical history of Anxiety, Asthma, COPD (chronic obstructive pulmonary disease) ([place]), Coronary artery disease, GERD (gastroesophageal reflux disease), Peripheral vascular disease ([place]), Pneumonia, Shortness of breath dyspnea, and Stroke ([place]).   Surgical History    Past Surgical History:  Procedure Laterality Date  . AORTIC ARCH ANGIOGRAPHY N/A 11/04/2017   Procedure: AORTIC ARCH ANGIOGRAPHY;  Surgeon: Elam Dutch, MD;  Location: [place] [facility];  Service: Cardiovascular;  Laterality: N/A;  . APPLICATION OF WOUND VAC N/A 01/06/2018   Procedure: APPLICATION OF WOUND VAC;  Surgeon: Ivin Poot, MD;  Location: [place];  Service: Thoracic;  Laterality: N/A;  . APPLICATION OF WOUND VAC N/A 01/13/2018   Procedure: WOUND VAC CHANGE;  Surgeon: Ivin Poot, MD;  Location: [place];  Service: Thoracic;  Laterality: N/A;  . CARDIAC CATHETERIZATION    . CAROTID-SUBCLAVIAN BYPASS GRAFT Right 12/26/2017   Procedure: AORTIC TO RIGHT  COMMON CAROTID AND RIGHT SUBCLAVIAN  ARTERY  BYPASS;  Surgeon: Elam Dutch, MD;  Location: [place];  Service: Vascular;  Laterality: Right;  . CHOLECYSTECTOMY    . CLOSURE OF DIAPHRAGM  12/26/2017   Procedure: REPAIR OF DIAPHRAGM;  Surgeon: Ivin Poot, MD;  Location: [place];  Service: Thoracic;;  . COLONOSCOPY N/A 10/03/2014   Procedure: COLONOSCOPY;  Surgeon: Rogene Houston, MD;  [facility];  Service: Endoscopy;  Laterality: N/A;  730  . CORONARY ARTERY BYPASS GRAFT N/A 12/14/2016   Procedure: CORONARY ARTERY BYPASS GRAFTING (CABG) x three , using left internal mammary artery and right leg greater saphenous vein harvested endoscopically;  Surgeon: Ivin Poot, MD;  Location: [place];  Service: Open Heart Surgery;  Laterality: N/A;  . ENDARTERECTOMY Right 12/06/2014   Procedure: ENDARTERECTOMY CAROTid;  Surgeon: Elam Dutch, MD;  Location[facility];  Service: Vascular;  Laterality: Right;  . ESOPHAGOGASTRODUODENOSCOPY N/A 12/03/2016   Procedure: ESOPHAGOGASTRODUODENOSCOPY (EGD);  Surgeon: Rogene Houston, MD;  [facility];  Service: Endoscopy;  Laterality: N/A;  7:30  . LEFT HEART CATH AND CORONARY ANGIOGRAPHY N/A 12/14/2016   Procedure: LEFT HEART CATH AND CORONARY ANGIOGRAPHY;  Surgeon: Burnell Blanks, MD;  Location: [place] [facility];  Service: Cardiovascular;  Laterality: N/A;  . LOBECTOMY Right 10/26/2018   Procedure: RIGHT UPPER LOBECTOMY;  Surgeon: Lajuana Matte, MD;  Location: [place];  Service: Thoracic;  Laterality: Right;  . PERIPHERAL VASCULAR CATHETERIZATION N/A 12/05/2014   Procedure:  Carotid Arch Angiography;  Surgeon: Conrad Harman, MD;  Location: [place] [facility];  Service: Cardiovascular;  Laterality: N/A;  . STERNAL WOUND DEBRIDEMENT N/A 01/06/2018   Procedure: STERNAL WOUND DEBRIDEMENT;  Surgeon: Ivin Poot, MD;  Location: [place];  Service: Thoracic;  Laterality: N/A;  . STERNOTOMY N/A 12/26/2017   Procedure: REDO  STERNOTOMY;  Surgeon: Prescott Gum, Collier Salina, MD;  Location: [place];  Service: Thoracic;  Laterality: N/A;  . TEE WITHOUT CARDIOVERSION N/A 12/14/2016   Procedure: TRANSESOPHAGEAL ECHOCARDIOGRAM (TEE);  Surgeon: Prescott Gum, Collier Salina, MD;  Location: [place];  Service: Open Heart Surgery;  Laterality: N/A;  . VIDEO ASSISTED THORACOSCOPY (VATS)/WEDGE RESECTION Right 10/26/2018   Procedure: VIDEO ASSISTED THORACOSCOPY (VATS)/WEDGE RESECTION;  Surgeon: Lajuana Matte, MD;  Location: [place];  Service: Thoracic;  Laterality: Right;  Marland Kitchen VIDEO BRONCHOSCOPY N/A 10/26/2018   Procedure: VIDEO BRONCHOSCOPY;  Surgeon: Lajuana Matte, MD;  Location: [place];  Service: Thoracic;  Laterality: N/A;     Social History   reports that she quit smoking about 4 years ago. She has a 80.00 pack-year smoking history. She has never used smokeless tobacco. She reports that she does not drink alcohol and does not use drugs.   Family History   Her family history includes Cancer in her mother; Diabetes in her mother; Heart attack in her father and mother; Heart disease in her father and mother.   Allergies No Known Allergies   Home Medications  Prior to Admission medications   Medication Sig Start Date End Date Taking? Authorizing Provider  albuterol (PROVENTIL) (2.5 MG/3ML) 0.083% nebulizer solution Take 2.5 mg by nebulization every 4 (four) hours as needed. 03/30/19  Yes [provider]  ALPRAZolam Duanne Moron) 0.5 MG tablet Take 0.5 mg by mouth 3 (three) times daily.    Yes [provider]  Ascorbic Acid (VITAMIN C GUMMIE) 120 MG CHEW Chew 500 mg by mouth.   Yes [provider]  aspirin 81 MG chewable tablet Chew 81 mg by mouth daily.   Yes [provider]  atorvastatin (LIPITOR) 40 MG tablet Take 1 tablet (40 mg total) daily by mouth. 12/27/16  Yes Gold, Wayne E, PA-C  calcium carbonate (OS-CAL) 1250 (500 Ca) MG chewable tablet Chew 1 tablet by mouth daily.   Yes [provider]    cholecalciferol (VITAMIN D3) 25 MCG (1000 UNIT) tablet Take 1,000 Units by mouth daily.   Yes [provider]  diphenhydramine-acetaminophen (TYLENOL PM) 25-500 MG TABS tablet Take 2-3 tablets by mouth at bedtime.   Yes [provider]  isosorbide mononitrate (IMDUR) 30 MG 24 hr tablet Take 0.5 tablets (15 mg total) by mouth daily. 03/21/19  Yes BranchAlphonse Guild, MD  metoprolol succinate (TOPROL-XL) 50 MG 24 hr tablet Take 50 mg by mouth daily. Take with or immediately following a meal.   Yes [provider]  Multiple Vitamin (MULTIVITAMIN WITH MINERALS) TABS tablet Take 1 tablet by mouth daily. 12/01/18  Yes Lars Pinks M, PA-C  pantoprazole (PROTONIX) 40 MG tablet Take 40 mg by mouth daily.   Yes [provider]  potassium chloride SA (K-DUR,KLOR-CON) 20 MEQ tablet Take 20 mEq by mouth daily.  10/20/17  Yes [provider]  Tiotropium Bromide-Olodaterol (STIOLTO RESPIMAT) 2.5-2.5 MCG/ACT AERS Inhale 2 puffs into the lungs daily. 07/19/19  Yes Mannam, Praveen, MD  torsemide (DEMADEX) 20 MG tablet Take 1 tablet (20 mg total) by mouth daily. 11/01/18  Yes Nani Skillern, PA-C      The patient is critically ill with multiple organ systems failure and requires high complexity decision making for assessment and support, frequent evaluation and titration of therapies, application of advanced monitoring technologies and extensive interpretation of multiple databases. Critical Care Time devoted to patient care services  described in this note is 45 minutes.    Christinia Gully, MD Pulmonary and [place] [phone]   After 7:00 pm call Elink  [phone]

## 2019-10-25 NOTE — Progress Notes (Signed)
CRITICAL VALUE ALERT  Critical Value:  [id number]  Date & Time Notied:  10/25/19 1330  Provider Notified: [facility]  Orders Received/Actions taken: No new orders received at this time

## 2019-10-25 NOTE — Progress Notes (Signed)
Pharmacy Antibiotic Note  Emily Rocha is a 64 y.o. female admitted on 10/24/2019 with pneumonia.  Pharmacy has been consulted for Vancomycin dosing.  Plan: Vancomycin 2000 mg IV x 1 dose. Vancomycin 1000 mg IV every 12 hours. Unasyn 3000 mg IV every 8 hours. Monitor labs, c/s, and vanco level as indicated.  Height: 5\' 5"  (165.1 cm) Weight: 89.3 kg (196 lb 13.9 oz) IBW/kg (Calculated) : 57  Temp (24hrs), Avg:98.2 F (36.8 C), Min:97.8 F (36.6 C), Max:98.7 F (37.1 C)  Recent Labs  Lab 10/24/19 1316 10/24/19 1357 10/24/19 1655 10/25/19 0352  WBC 12.4*  --   --  12.3*  CREATININE 1.17*  --   --  0.70  LATICACIDVEN  --  1.2 1.1  --     Estimated Creatinine Clearance: 76.3 mL/min (by C-G formula based on SCr of 0.7 mg/dL).    No Known Allergies  Antimicrobials this admission: Vanco 9/9 >>  Unasyn 9/8 >>    Microbiology results: 9/8 BCx: ngtd 9/8 Sputum Cx: pending 9/9 MRSA PCR: positive    Thank you for allowing pharmacy to be a part of this patient's care.  Ramond Craver 10/25/2019 4:55 PM

## 2019-10-25 NOTE — ED Notes (Signed)
Report called to [name] in the ICU.   Pt going to [place] for thoracentesis first and then will transport to ICU.  PT no distress.

## 2019-10-25 NOTE — Progress Notes (Signed)
ANTICOAGULATION CONSULT NOTE   Pharmacy Consult for Heparin Indication: elevated toponins    Anticoag: Start IV heparin for elevated troponins and thrombus on CT (not PE). Hgb 12.1. Ddimer 5.27.  - 9/8 CT: s/p RU lobectomy. Interval development of thrombus within the most distal portion of the residual stump of the right upper lobe branch of the right pulmonary artery. No definite pulmonary embolus is noted. Heparin level 1.1 units/ml Goal of Therapy:  Heparin level 0.3-0.7 units/ml Monitor platelets by anticoagulation protocol: Yes   Plan:  Decrease heparin to 1100 units/hr Check heparin level in 6 hours  Emily Rocha 10/25/2019,5:03 AM

## 2019-10-25 NOTE — Progress Notes (Signed)
Patient Demographics:    Emily Rocha, is a 64 y.o. female, DOB - 02/14/1956, XID:[id number]  Admit date - 10/24/2019   Admitting Physician Emily Haff, MD  Outpatient Primary MD for the patient is Emily Chroman, MD  LOS - 1   Chief Complaint  Patient presents with  . Shortness of Breath        Subjective:    Emily Rocha today has no fevers, no emesis,   -Hypoxia and confusion persist -Husband at bedside  Assessment  & Plan :    Principal Problem:   Acute respiratory failure with hypoxia (HCC) Active Problems:   Pneumonia   Pleural effusion   History of lung cancer   CAD (coronary artery disease)   Acute respiratory failure [facility])  Brief Summary:- 64 y.o. female with a past medical history of coronary artery disease status post CABG, adenocarcinoma of the lung status post VATS and right upper lobe lobectomy admitted on 10/24/2019 with acute hypoxic and hypercapnic respiratory failure -Radiology at [facility] unsuccessfully tried under ultrasound to aspirate loculated right-sided pleural effusion -Need to rule out infection/possible empyema versus malignant effusion -Pulmonology consult from Dr. Melvyn Novas appreciated--he recommends transfer to [facility] for CT surgery evaluation  A/p 1)H/o CAD -Now with Elevated Troponin--- most likely due to demand ischemia in the setting of respiratory failure with hypoxia and moderately severe aortic stenosis -- CTA chest also shows interval development of thrombus within the most distal portion of the residual stump of the right upper lobe branch of the right pulmonary artery. No definite pulmonary embolus is noted. -Continue IV heparin  -Continue metoprolol, aspirin and isosorbide -Await echo  2)Rt sided pleuritic chest pain /right-sided loculated pleural effusion---- -CTA chest shows  loculated effusion  -Unsuccessful attempt at ultrasound-guided  thoracentesis on 10/25/2019 on the pain -s/p RULobectomy for Stage I lung ca by Dr. Kipp Brood -CT surgery consult requested--awaiting further input from CT surgery ddx = malignant effusion vs  empyema  From ? Asp or may involve  MRSA since nasal pcr pos > add vanc/ check ESR  ? In setting of CAP/ asp > unsyn/ zmax good emprical rx  3) acute resp failure hypercabic/hypoxemic assoc with I  Plus GOLD 3 copd  -Continue bronchodilators and supplemental oxygen  4) hyponatremia/ hyperkalemia on supplemental K with good renal function -Continue IV fluids -Give Lokelma  5) acute metabolic encephalopathy----secondary to #3 and #4 above --- Haldol and lorazepam as needed -CT head without acute findings -Per husband patient is not a drinker  6)Transaminitis--- ALT particularly, hold statin therapy   Disposition/Need for in-Hospital Stay- patient unable to be discharged at this time due to -Radiology at [facility] unsuccessfully tried under ultrasound to aspirate loculated right-sided pleural effusion -Need to rule out infection/possible empyema versus malignant effusion -Pulmonology consult from Dr. Melvyn Novas appreciated--he recommends transfer to [facility] for CT surgery evaluation   Status is: Inpatient  Remains inpatient appropriate because:Loculated possibly infected right-sided pleural effusion requiring IV antibiotics and CT surgery intervention   -  Disposition: The patient is from: Home              Anticipated d/c is to: Home              Anticipated d/c  date is: > 3 days              Patient currently is not medically stable to d/c. Barriers: Not Clinically Stable- Loculated possibly infected right-sided pleural effusion requiring IV antibiotics and CT surgery intervention  Code Status : Full code  Family Communication: Discussed with Husband   Consults  :  PCCM/CT Surgery  DVT Prophylaxis  :   Iv Heparin/ - SCDs   Lab Results  Component Value Date   PLT 215 10/25/2019     Inpatient Medications  Scheduled Meds: . aspirin  81 mg Oral Daily  . azithromycin  500 mg Oral Daily  . Chlorhexidine Gluconate Cloth  6 each Topical Daily  . ipratropium-albuterol  3 mL Nebulization QID  . isosorbide mononitrate  15 mg Oral Daily  . methylPREDNISolone (SOLU-MEDROL) injection  60 mg Intravenous Q12H  . metoprolol succinate  50 mg Oral Daily  . mometasone-formoterol  2 puff Inhalation BID  . pantoprazole  40 mg Oral Daily   Continuous Infusions: . ampicillin-sulbactam (UNASYN) IV 3 g (10/25/19 1330)  . heparin 1,050 Units/hr (10/25/19 1525)   PRN Meds:.acetaminophen **OR** acetaminophen, albuterol, ALPRAZolam, ALPRAZolam, haloperidol lactate, ondansetron **OR** ondansetron (ZOFRAN) IV    Anti-infectives (From admission, onward)   Start     Dose/Rate Route Frequency Ordered Stop   10/24/19 2030  Ampicillin-Sulbactam (UNASYN) 3 g in sodium chloride 0.9 % 100 mL IVPB        3 g 200 mL/hr over 30 Minutes Intravenous Every 8 hours 10/24/19 1949     10/24/19 2000  azithromycin (ZITHROMAX) tablet 500 mg        500 mg Oral Daily 10/24/19 1923 10/29/19 0959        Objective:   Vitals:   10/25/19 1157 10/25/19 1200 10/25/19 1457 10/25/19 1632  BP:  126/74    Pulse:  87    Resp:  (!) 27    Temp:    97.9 F (36.6 C)  TempSrc:    Axillary  SpO2: 99% 100% 100%   Weight:      Height:        Wt Readings from Last 3 Encounters:  10/25/19 89.3 kg  10/18/19 85.8 kg  10/01/19 85.7 kg     Intake/Output Summary (Last 24 hours) at 10/25/2019 1649 Last data filed at 10/24/2019 2109 Gross per 24 hour  Intake 650.22 ml  Output --  Net 650.22 ml     Physical Exam  Gen:- Awake Alert, intermittently confused HEENT:- [place].AT, No sclera icterus Nose- 3L/min Neck-Supple Neck,No JVD,.  Lungs-diminished on the right, scattered rhonchi  CV- S1, S2 normal, regular, 3/6 sm Abd-  +ve B.Sounds, Abd Soft, No tenderness,    Extremity/Skin:- No  edema, pedal pulses  present Psych-intermittent confusion and disorientation  neuro-no new focal deficits, no tremors   Data Review:   Micro Results Recent Results (from the past 240 hour(s))  SARS Coronavirus 2 by RT PCR (hospital order, performed in [facility] hospital lab) Nasopharyngeal Nasopharyngeal Swab     Status: None   Collection Time: 10/24/19  1:12 PM   Specimen: Nasopharyngeal Swab  Result Value Ref Range Status   SARS Coronavirus 2 NEGATIVE NEGATIVE Final    Comment: (NOTE) SARS-CoV-2 target nucleic acids are NOT DETECTED.  The SARS-CoV-2 RNA is generally detectable in upper and lower respiratory specimens during the acute phase of infection. The lowest concentration of SARS-CoV-2 viral copies this assay can detect is 250 copies / mL. A negative  result does not preclude SARS-CoV-2 infection and should not be used as the sole basis for treatment or other patient management decisions.  A negative result may occur with improper specimen collection / handling, submission of specimen other than nasopharyngeal swab, presence of viral mutation(s) within the areas targeted by this assay, and inadequate number of viral copies (<250 copies / mL). A negative result must be combined with clinical observations, patient history, and epidemiological information.  Fact Sheet for Patients:   StrictlyIdeas.no  Fact Sheet for Healthcare Providers: BankingDealers.co.za  This test is not yet approved or  cleared by the [place] FDA and has been authorized for detection and/or diagnosis of SARS-CoV-2 by FDA under an Emergency Use Authorization (EUA).  This EUA will remain in effect (meaning this test can be used) for the duration of the COVID-19 declaration under Section 564(b)(1) of the Act, 21 U.S.C. section 360bbb-3(b)(1), unless the authorization is terminated or revoked sooner.  Performed at [facility], [address], [place], [place]  [address]   Blood Culture (routine x 2)     Status: None (Preliminary result)   Collection Time: 10/24/19  1:57 PM   Specimen: Right Antecubital; Blood  Result Value Ref Range Status   Specimen Description   Final    RIGHT ANTECUBITAL BOTTLES DRAWN AEROBIC AND ANAEROBIC   Special Requests Blood Culture adequate volume  Final   Culture   Final    NO GROWTH < 24 HOURS Performed at [facility], [address] [place], [place] [address]    Report Status PENDING  Incomplete  Blood Culture (routine x 2)     Status: None (Preliminary result)   Collection Time: 10/24/19  1:57 PM   Specimen: Right Antecubital; Blood  Result Value Ref Range Status   Specimen Description   Final    RIGHT ANTECUBITAL BOTTLES DRAWN AEROBIC AND ANAEROBIC   Special Requests Blood Culture adequate volume  Final   Culture   Final    NO GROWTH < 24 HOURS Performed at [facility], [address], [place], [place] [address]    Report Status PENDING  Incomplete  MRSA PCR Screening     Status: Abnormal   Collection Time: 10/25/19 11:18 AM   Specimen: Nasal Mucosa; Nasopharyngeal  Result Value Ref Range Status   MRSA by PCR POSITIVE (A) NEGATIVE Final    Comment:        The GeneXpert MRSA Assay (FDA approved for NASAL specimens only), is one component of a comprehensive MRSA colonization surveillance program. It is not intended to diagnose MRSA infection nor to guide or monitor treatment for MRSA infections. RESULT CALLED TO, READ BACK BY AND VERIFIED WITH: MURPHY,E_0  BY MATTHEWS, B 9.9.2021 Performed at [facility], [address] [place], [place] [address]     Radiology Reports CT HEAD WO CONTRAST  Result Date: 10/25/2019 CLINICAL DATA:  64 year old female with hypoxia and altered mental status. EXAM: CT HEAD WITHOUT CONTRAST TECHNIQUE: Contiguous axial images were obtained from the base of the skull through the vertex without intravenous contrast. COMPARISON:  Head CT 09/28/2017. FINDINGS: Brain: No  midline shift, ventriculomegaly, mass effect, evidence of mass lesion, intracranial hemorrhage or evidence of cortically based acute infarction. Scattered, patchy bilateral cerebral white matter hypodensity appears not significantly changed. Otherwise gray-white matter differentiation remains within normal limits. Vascular: Calcified atherosclerosis at the skull base. Skull: No acute osseous abnormality identified. Sinuses/Orbits: Visualized paranasal sinuses and mastoids are stable and well pneumatized. Other: Visualized orbits and scalp soft tissues  are within normal limits. IMPRESSION: 1. No acute intracranial abnormality by CT. 2. Patchy chronic white matter changes, most commonly due to chronic small vessel disease. Electronically Signed   By: Genevie Ann M.D.   On: 10/25/2019 16:10   CT Angio Chest PE W and/or Wo Contrast  Result Date: 10/24/2019 CLINICAL DATA:  Chest pain. EXAM: CT ANGIOGRAPHY CHEST WITH CONTRAST TECHNIQUE: Multidetector CT imaging of the chest was performed using the standard protocol during bolus administration of intravenous contrast. Multiplanar CT image reconstructions and MIPs were obtained to evaluate the vascular anatomy. CONTRAST:  168m OMNIPAQUE IOHEXOL 350 MG/ML SOLN COMPARISON:  February 27, 2019. FINDINGS: Cardiovascular: Atherosclerosis of thoracic aorta is noted without aneurysm or dissection. Status post coronary bypass graft. The patient is status post right upper lobectomy. There is the interval development of thrombus within the most distal portion of the stump of the right upper lobe branch of the right pulmonary artery. No definite embolus is noted. Mild cardiomegaly is noted. No pericardial effusion is noted. Mediastinum/Nodes: Moderate size sliding-type hiatal hernia is noted. Thyroid gland is unremarkable. No adenopathy is noted. Lungs/Pleura: No pneumothorax is noted. Left lung is clear. Moderate size loculated right pleural effusion is noted with associated  subsegmental atelectasis of the right lower lobe. Status post right upper lobectomy as described above. Upper Abdomen: No acute abnormality. Musculoskeletal: No chest wall abnormality. No acute or significant osseous findings. Review of the MIP images confirms the above findings. IMPRESSION: 1. Status post right upper lobectomy. Interval development of thrombus within the most distal portion of the residual stump of the right upper lobe branch of the right pulmonary artery. No definite pulmonary embolus is noted. 2. Moderate size loculated right pleural effusion is noted with associated subsegmental atelectasis of the right lower lobe. 3. Moderate size sliding-type hiatal hernia. 4. Aortic atherosclerosis. Aortic Atherosclerosis (ICD10-I70.0). Electronically Signed   By: JMarijo ConceptionM.D.   On: 10/24/2019 15:47   [place]CHEST (PLEURAL EFFUSION)  Result Date: 10/25/2019 CLINICAL DATA:  RIGHT pleural effusion by CT, history of COPD, GERD, RIGHT upper lobe lung cancer post upper lobectomy EXAM: CHEST ULTRASOUND COMPARISON:  CT angio chest 10/24/2019 FINDINGS: Small pleural effusion identified at the RIGHT lung base. Effusion is partially loculated. Atelectatic RIGHT lower lobe is adherent to the posterior chest wall and prevents safe access of the inferior thoracic cavity for thoracentesis. This appearance was seen on the prior CT but appears fixed with change in position on ultrasound. IMPRESSION: Small loculated RIGHT pleural effusion, unable to safely access for thoracentesis. Electronically Signed   By: MLavonia DanaM.D.   On: 10/25/2019 11:00   DG Chest Portable 1 View  Result Date: 10/24/2019 CLINICAL DATA:  Shortness of breath with fever and chills EXAM: PORTABLE CHEST 1 VIEW COMPARISON:  May 25, 2019 FINDINGS: There is a small right pleural effusion. There is postoperative change with scarring on the right. There is no edema or airspace opacity. Heart is upper normal in size with pulmonary vascularity  normal. Patient is status post internal mammary bypass grafting. No adenopathy. No bone lesions. IMPRESSION: Small right pleural effusion. Scarring with volume loss on the right. No frank edema or airspace opacity. Stable cardiac silhouette. Postoperative changes noted. Electronically Signed   By: WLowella GripIII M.D.   On: 10/24/2019 13:32     CBC Recent Labs  Lab 10/24/19 1316 10/25/19 0352  WBC 12.4* 12.3*  HGB 12.1 11.7*  HCT 39.7 39.0  PLT 224 215  MCV  89.4 91.3  MCH 27.3 27.4  MCHC 30.5 30.0  RDW 15.3 15.3  LYMPHSABS 1.5  --   MONOABS 1.5*  --   EOSABS 0.0  --   BASOSABS 0.1  --     Chemistries  Recent Labs  Lab 10/24/19 1316 10/25/19 0352  NA 127* 129*  K 5.8*  5.5* 5.7*  CL 92* 92*  CO2 27 27  GLUCOSE 158* 154*  BUN 46* 33*  CREATININE 1.17* 0.70  CALCIUM 8.2* 8.4*  AST 46* 33  ALT 145* 123*  ALKPHOS 114 108  BILITOT 1.1 0.8   ------------------------------------------------------------------------------------------------------------------ Recent Labs    10/24/19 1316  TRIG 151*    Lab Results  Component Value Date   HGBA1C 6.2 (H) 12/22/2017   ------------------------------------------------------------------------------------------------------------------ No results for input(s): TSH, T4TOTAL, T3FREE, THYROIDAB in the last 72 hours.  Invalid input(s): FREET3 ------------------------------------------------------------------------------------------------------------------ Recent Labs    10/24/19 1316  FERRITIN 13    Coagulation profile No results for input(s): INR, PROTIME in the last 168 hours.  Recent Labs    10/24/19 1316  DDIMER 5.27*    Cardiac Enzymes No results for input(s): CKMB, TROPONINI, MYOGLOBIN in the last 168 hours.  Invalid input(s): CK ------------------------------------------------------------------------------------------------------------------    Component Value Date/Time   BNP 458.0 (H) 12/12/2016  1857     [name] M.D on 10/25/2019 at 4:49 PM  Go to [email or web address] - for contact info  [facility] - Office  [phone]

## 2019-10-25 NOTE — ED Notes (Signed)
Pt resting quietly.  Denies any SOB or pain at this time.  NO visible  distress.

## 2019-10-25 NOTE — Progress Notes (Addendum)
PT Cancellation Note  Patient Details Name: Emily Rocha MRN: [id number] DOB: 1955-06-24   Cancelled Treatment:     Attempted to eval patient today, but pt was too agitated for therapy. Patient will be evaluated tomorrow granted behavioral status is appropriate for therapy.   2:12 PM , 10/25/19 Karlyn Agee, SPT Physical Therapy with [facility] [phone] office  During this treatment session, the therapist was present, participating in and directing the treatment.  2:19 PM, 10/25/19 Lonell Grandchild, MPT Physical Therapist with [facility] [phone] office [phone] mobile phone

## 2019-10-25 NOTE — Progress Notes (Signed)
ANTICOAGULATION CONSULT NOTE -   Pharmacy Consult for heparin IV Indication: Elevated troponin  No Known Allergies  Patient Measurements: Height: 5\' 5"  (165.1 cm) Weight: 89.3 kg (196 lb 13.9 oz) IBW/kg (Calculated) : 57 Heparin Dosing Weight: 73 kg  Vital Signs: Temp: 98.4 F (36.9 C) (09/09 1114) Temp Source: Oral (09/09 1114) BP: 126/74 (09/09 1200) Pulse Rate: 87 (09/09 1200)  Labs: Recent Labs    10/24/19 1316 10/24/19 1655 10/25/19 0352 10/25/19 1348  HGB 12.1  --  11.7*  --   HCT 39.7  --  39.0  --   PLT 224  --  215  --   HEPARINUNFRC  --   --  1.10* 0.70  CREATININE 1.17*  --  0.70  --   TROPONINIHS 79* 84*  --   --     Estimated Creatinine Clearance: 76.3 mL/min (by C-G formula based on SCr of 0.7 mg/dL).   Medical History: Past Medical History:  Diagnosis Date  . Anxiety   . Asthma   . COPD (chronic obstructive pulmonary disease) ([place])   . Coronary artery disease    hx CABG  . GERD (gastroesophageal reflux disease)   . Peripheral vascular disease ([place])   . Pneumonia   . Shortness of breath dyspnea   . Stroke [facility])    "mini stroke" numbness and weakness to left side of the body    Medications:  Medications Prior to Admission  Medication Sig Dispense Refill Last Dose  . albuterol (PROVENTIL) (2.5 MG/3ML) 0.083% nebulizer solution Take 2.5 mg by nebulization every 4 (four) hours as needed.     . ALPRAZolam (XANAX) 0.5 MG tablet Take 0.5 mg by mouth 3 (three) times daily.    10/23/2019 at Unknown time  . Ascorbic Acid (VITAMIN C GUMMIE) 120 MG CHEW Chew 500 mg by mouth.   10/23/2019 at Unknown time  . aspirin 81 MG chewable tablet Chew 81 mg by mouth daily.   10/23/2019 at 0900  . atorvastatin (LIPITOR) 40 MG tablet Take 1 tablet (40 mg total) daily by mouth. 30 tablet 1 10/23/2019 at Unknown time  . calcium carbonate (OS-CAL) 1250 (500 Ca) MG chewable tablet Chew 1 tablet by mouth daily.   10/23/2019 at Unknown time  . cholecalciferol (VITAMIN D3) 25 MCG  (1000 UNIT) tablet Take 1,000 Units by mouth daily.   10/23/2019 at Unknown time  . diphenhydramine-acetaminophen (TYLENOL PM) 25-500 MG TABS tablet Take 2-3 tablets by mouth at bedtime.   10/23/2019 at Unknown time  . isosorbide mononitrate (IMDUR) 30 MG 24 hr tablet Take 0.5 tablets (15 mg total) by mouth daily. 45 tablet 3 10/23/2019 at Unknown time  . metoprolol succinate (TOPROL-XL) 50 MG 24 hr tablet Take 50 mg by mouth daily. Take with or immediately following a meal.   10/23/2019 at 0900  . Multiple Vitamin (MULTIVITAMIN WITH MINERALS) TABS tablet Take 1 tablet by mouth daily.   10/23/2019 at Unknown time  . pantoprazole (PROTONIX) 40 MG tablet Take 40 mg by mouth daily.   10/23/2019 at Unknown time  . potassium chloride SA (K-DUR,KLOR-CON) 20 MEQ tablet Take 20 mEq by mouth daily.   2 10/23/2019 at Unknown time  . Tiotropium Bromide-Olodaterol (STIOLTO RESPIMAT) 2.5-2.5 MCG/ACT AERS Inhale 2 puffs into the lungs daily. 4 g 4 10/23/2019 at Unknown time  . torsemide (DEMADEX) 20 MG tablet Take 1 tablet (20 mg total) by mouth daily.   10/23/2019 at Unknown time    Assessment: IV heparin for elevated troponins  and thrombus on CT (not PE). Hgb 12.1. Ddimer 5.27.  - 9/8 CT: s/p RU lobectomy. Interval development of thrombus within the most distal portion of the residual stump of the right upper lobe branch of the right pulmonary artery. No definite pulmonary embolus is noted.  Heparin level 0.7  Goal of Therapy:  Heparin level 0.3-0.7 units/ml Monitor platelets by anticoagulation protocol: Yes   Plan:  Decrease heparin infusion to 1050 units/hr  Check anti-Xa level in 6 hours and daily while on heparin Continue to monitor H&H and platelets  Ramond Craver 10/25/2019,2:31 PM

## 2019-10-26 ENCOUNTER — Inpatient Hospital Stay (HOSPITAL_COMMUNITY): Payer: Medicare HMO

## 2019-10-26 LAB — CBC
HCT: 34.8 % — ABNORMAL LOW (ref 36.0–46.0)
Hemoglobin: 10.4 g/dL — ABNORMAL LOW (ref 12.0–15.0)
MCH: 27.4 pg (ref 26.0–34.0)
MCHC: 29.9 g/dL — ABNORMAL LOW (ref 30.0–36.0)
MCV: 91.6 fL (ref 80.0–100.0)
Platelets: 173 10*3/uL (ref 150–400)
RBC: 3.8 MIL/uL — ABNORMAL LOW (ref 3.87–5.11)
RDW: 15.2 % (ref 11.5–15.5)
WBC: 18.6 10*3/uL — ABNORMAL HIGH (ref 4.0–10.5)
nRBC: 3.4 % — ABNORMAL HIGH (ref 0.0–0.2)

## 2019-10-26 LAB — COMPREHENSIVE METABOLIC PANEL
ALT: 83 U/L — ABNORMAL HIGH (ref 0–44)
AST: 25 U/L (ref 15–41)
Albumin: 3.4 g/dL — ABNORMAL LOW (ref 3.5–5.0)
Alkaline Phosphatase: 86 U/L (ref 38–126)
Anion gap: 9 (ref 5–15)
BUN: 30 mg/dL — ABNORMAL HIGH (ref 8–23)
CO2: 28 mmol/L (ref 22–32)
Calcium: 7.9 mg/dL — ABNORMAL LOW (ref 8.9–10.3)
Chloride: 93 mmol/L — ABNORMAL LOW (ref 98–111)
Creatinine, Ser: 0.58 mg/dL (ref 0.44–1.00)
GFR calc Af Amer: >60 mL/min (ref >60)
GFR calc non Af Amer: >60 mL/min (ref >60)
Glucose, Bld: 147 mg/dL — ABNORMAL HIGH (ref 70–99)
Potassium: 5.4 mmol/L — ABNORMAL HIGH (ref 3.5–5.1)
Sodium: 130 mmol/L — ABNORMAL LOW (ref 135–145)
Total Bilirubin: 0.8 mg/dL (ref 0.3–1.2)
Total Protein: 6.5 g/dL (ref 6.5–8.1)

## 2019-10-26 LAB — HEPARIN LEVEL (UNFRACTIONATED)
Heparin Unfractionated: 0.73 IU/mL — ABNORMAL HIGH (ref 0.30–0.70)
Heparin Unfractionated: 0.71 IU/mL — ABNORMAL HIGH (ref 0.30–0.70)
Heparin Unfractionated: 0.2 IU/mL — ABNORMAL LOW (ref 0.30–0.70)

## 2019-10-26 LAB — CREATININE, SERUM
Creatinine, Ser: 0.69 mg/dL (ref 0.44–1.00)
GFR calc Af Amer: >60 mL/min (ref >60)
GFR calc non Af Amer: >60 mL/min (ref >60)

## 2019-10-26 MED ORDER — SODIUM ZIRCONIUM CYCLOSILICATE 10 G PO PACK
10.0000 g | PACK | Freq: Once | ORAL | Status: AC
  Administered 2019-10-26: 10 g via ORAL
  Filled 2019-10-26: qty 1

## 2019-10-26 NOTE — Progress Notes (Signed)
Patient Demographics:    Emily Rocha, is a 64 y.o. female, DOB - 05-11-1955, NUU:[id number]  Admit date - 10/24/2019   Admitting Physician Bonnielee Haff, MD  Outpatient Primary MD for the patient is Glenda Chroman, MD  LOS - 2   Chief Complaint  Patient presents with   Shortness of Breath        Subjective:    Emily Rocha today has no fevers, no emesis,   Less confused, more cooperative -Hypoxia persist -Still awaiting transfer to [facility] for CT surgery eval -  Assessment  & Plan :    Principal Problem:   Acute respiratory failure with hypoxia ([place]) Active Problems:   Pneumonia   Pleural effusion   History of lung cancer   CAD (coronary artery disease)   Acute respiratory failure ([place])  Brief Summary:- 64 y.o. female with a past medical history of coronary artery disease status post CABG, adenocarcinoma of the lung status post VATS and right upper lobe lobectomy admitted on 10/24/2019 with acute hypoxic and hypercapnic respiratory failure -Radiology at [facility] unsuccessfully tried under ultrasound to aspirate loculated right-sided pleural effusion -Need to rule out infection/possible empyema versus malignant effusion -Pulmonology consult from Dr. Melvyn Novas appreciated--he recommends transfer to [facility] for [place] surgery evaluation -CT surgeon Dr. Kipp Brood has reviewed patient's chest imaging studies on chart Dr. Kipp Brood  (cardiothoracic surgeon ) said Emily Rocha  can be transferred under hospitalist to [facility] and he will see her once she arrives. Hes on call all weekend so he will be around.  A/p 1)H/o CAD -Now with Elevated Troponin--- most likely due to demand ischemia in the setting of respiratory failure with hypoxia and moderately severe aortic stenosis -- CTA chest also shows interval development of thrombus within the most distal portion of the residual stump of the right upper  lobe branch of the right pulmonary artery. No definite pulmonary embolus is noted. -Continue IV heparin through 10/27/2019 -Continue metoprolol, aspirin and isosorbide -Await echo  2)Rt sided pleuritic chest pain /right-sided loculated pleural effusion---- -CTA chest shows  loculated effusion  -Unsuccessful attempt at ultrasound-guided thoracentesis on 10/25/2019 on the pain -s/p RULobectomy for Stage I lung ca by Dr. Kipp Brood -CT surgery consult requested--awaiting further input from CT surgery ddx = malignant effusion vs  empyema   --MRSA screen is positive WBC 12.4>>12.3 >>18.6 (steroids) -Continue Unasyn, azithromycin and vancomycin -CT surgeon Dr. Kipp Brood has reviewed patient's chest imaging studies on chart Dr. Kipp Brood  (cardiothoracic surgeon ) said Emily Rocha  can be transferred under hospitalist to [facility] and he will see her once she arrives.  3) acute resp failure hypercabic/hypoxemic assoc with I  Plus GOLD 3 copd  -Continue bronchodilators and supplemental oxygen  4) hyponatremia/ hyperkalemia on supplemental K with good renal function -Continue IV fluids -Repeat Lokelma Na 127>>129>>130  5) acute metabolic encephalopathy----secondary to #3 and #4 above --- Haldol and lorazepam as needed -CT head without acute findings -Per husband patient is not a drinker  6)Transaminitis--- ALT particularly, hold statin therapy ALT 145>>123>>83   Disposition/Need for in-Hospital Stay- patient unable to be discharged at this time due to -Radiology at [facility] unsuccessfully tried under ultrasound to aspirate loculated right-sided pleural effusion -Need to rule out infection/possible  empyema versus malignant effusion -Pulmonology consult from Dr. Melvyn Novas appreciated--he recommends transfer to [facility] for CT surgery evaluation - awaiting transfer to [facility] for CT surgery eval  Status is: Inpatient  Remains inpatient appropriate because:Loculated possibly infected  right-sided pleural effusion requiring IV antibiotics and CT surgery intervention   -  Disposition: The patient is from: Home              Anticipated d/c is to: Home              Anticipated d/c date is: > 3 days              Patient currently is not medically stable to d/c. Barriers: Not Clinically Stable- Loculated possibly infected right-sided pleural effusion requiring IV antibiotics and CT surgery intervention  awaiting transfer to [facility] for CT surgery eval  Code Status : Full code  Family Communication: Discussed with Husband   Consults  :  PCCM/CT Surgery  DVT Prophylaxis  :   Iv Heparin/ - SCDs   Lab Results  Component Value Date   PLT 173 10/26/2019    Inpatient Medications  Scheduled Meds:  aspirin  81 mg Oral Daily   azithromycin  500 mg Oral Daily   Chlorhexidine Gluconate Cloth  6 each Topical Daily   Chlorhexidine Gluconate Cloth  6 each Topical Q0600   ipratropium-albuterol  3 mL Nebulization QID   isosorbide mononitrate  15 mg Oral Daily   methylPREDNISolone (SOLU-MEDROL) injection  40 mg Intravenous Q12H   metoprolol succinate  50 mg Oral Daily   mometasone-formoterol  2 puff Inhalation BID   mupirocin ointment  1 application Nasal BID   pantoprazole  40 mg Oral Daily   sodium zirconium cyclosilicate  10 g Oral Once   Continuous Infusions:  ampicillin-sulbactam (UNASYN) IV 3 g (10/26/19 1310)   heparin 950 Units/hr (10/26/19 1038)   vancomycin Stopped (10/26/19 0624)   PRN Meds:.acetaminophen **OR** acetaminophen, albuterol, ALPRAZolam, ALPRAZolam, haloperidol lactate, ondansetron **OR** ondansetron (ZOFRAN) IV    Anti-infectives (From admission, onward)   Start     Dose/Rate Route Frequency Ordered Stop   10/26/19 0600  vancomycin (VANCOCIN) IVPB 1000 mg/200 mL premix        1,000 mg 200 mL/hr over 60 Minutes Intravenous Every 12 hours 10/25/19 1654     10/25/19 1715  vancomycin (VANCOREADY) IVPB 2000 mg/400 mL        2,000  mg 200 mL/hr over 120 Minutes Intravenous  Once 10/25/19 1652 10/25/19 2000   10/24/19 2030  Ampicillin-Sulbactam (UNASYN) 3 g in sodium chloride 0.9 % 100 mL IVPB        3 g 200 mL/hr over 30 Minutes Intravenous Every 8 hours 10/24/19 1949     10/24/19 2000  azithromycin (ZITHROMAX) tablet 500 mg        500 mg Oral Daily 10/24/19 1923 10/29/19 0959        Objective:   Vitals:   10/26/19 1300 10/26/19 1400 10/26/19 1500 10/26/19 1633  BP: 123/67 136/75 134/75   Pulse: 80 87 85   Resp: (!) 22 (!) 24 19   Temp:      TempSrc:      SpO2: 96% 95% 97% 95%  Weight:      Height:        Wt Readings from Last 3 Encounters:  10/26/19 90 kg  10/18/19 85.8 kg  10/01/19 85.7 kg     Intake/Output Summary (Last 24 hours) at 10/26/2019  1640 Last data filed at 10/26/2019 1038 Gross per 24 hour  Intake 1475.07 ml  Output 700 ml  Net 775.07 ml    Physical Exam  Gen:- Awake Alert, intermittently confused HEENT:- [place].AT, No sclera icterus Nose- 3L/min Neck-Supple Neck,No JVD,.  Lungs-diminished on the right, scattered rhonchi  CV- S1, S2 normal, regular, 3/6 sm Abd-  +ve B.Sounds, Abd Soft, No tenderness,    Extremity/Skin:- No  edema, pedal pulses present Psych-intermittent confusion and disorientation  neuro-generalized weakness no new focal deficits, no tremors   Data Review:   Micro Results Recent Results (from the past 240 hour(s))  SARS Coronavirus 2 by RT PCR (hospital order, performed in [facility] hospital lab) Nasopharyngeal Nasopharyngeal Swab     Status: None   Collection Time: 10/24/19  1:12 PM   Specimen: Nasopharyngeal Swab  Result Value Ref Range Status   SARS Coronavirus 2 NEGATIVE NEGATIVE Final    Comment: (NOTE) SARS-CoV-2 target nucleic acids are NOT DETECTED.  The SARS-CoV-2 RNA is generally detectable in upper and lower respiratory specimens during the acute phase of infection. The lowest concentration of SARS-CoV-2 viral copies this assay can detect  is 250 copies / mL. A negative result does not preclude SARS-CoV-2 infection and should not be used as the sole basis for treatment or other patient management decisions.  A negative result may occur with improper specimen collection / handling, submission of specimen other than nasopharyngeal swab, presence of viral mutation(s) within the areas targeted by this assay, and inadequate number of viral copies (<250 copies / mL). A negative result must be combined with clinical observations, patient history, and epidemiological information.  Fact Sheet for Patients:   StrictlyIdeas.no  Fact Sheet for Healthcare Providers: BankingDealers.co.za  This test is not yet approved or  cleared by the [place] FDA and has been authorized for detection and/or diagnosis of SARS-CoV-2 by FDA under an Emergency Use Authorization (EUA).  This EUA will remain in effect (meaning this test can be used) for the duration of the COVID-19 declaration under Section 564(b)(1) of the Act, 21 U.S.C. section 360bbb-3(b)(1), unless the authorization is terminated or revoked sooner.  Performed at [facility], [address] [place], [place] [address]   Blood Culture (routine x 2)     Status: None (Preliminary result)   Collection Time: 10/24/19  1:57 PM   Specimen: Right Antecubital; Blood  Result Value Ref Range Status   Specimen Description   Final    RIGHT ANTECUBITAL BOTTLES DRAWN AEROBIC AND ANAEROBIC   Special Requests Blood Culture adequate volume  Final   Culture   Final    NO GROWTH 2 DAYS Performed at [facility], [address] [place], [place] [address]    Report Status PENDING  Incomplete  Blood Culture (routine x 2)     Status: None (Preliminary result)   Collection Time: 10/24/19  1:57 PM   Specimen: Right Antecubital; Blood  Result Value Ref Range Status   Specimen Description   Final    RIGHT ANTECUBITAL BOTTLES DRAWN AEROBIC AND  ANAEROBIC   Special Requests Blood Culture adequate volume  Final   Culture   Final    NO GROWTH 2 DAYS Performed at [facility], [address] [place], [place] [address]    Report Status PENDING  Incomplete  MRSA PCR Screening     Status: Abnormal   Collection Time: 10/25/19 11:18 AM   Specimen: Nasal Mucosa; Nasopharyngeal  Result Value Ref Range Status   MRSA  by PCR POSITIVE (A) NEGATIVE Final    Comment:        The GeneXpert MRSA Assay (FDA approved for NASAL specimens only), is one component of a comprehensive MRSA colonization surveillance program. It is not intended to diagnose MRSA infection nor to guide or monitor treatment for MRSA infections. RESULT CALLED TO, READ BACK BY AND VERIFIED WITH: MURPHY,[email or web address]  BY MATTHEWS, B 9.9.2021 Performed at [facility], [address] [place], [place] [address]     Radiology Reports CT HEAD WO CONTRAST  Result Date: 10/25/2019 CLINICAL DATA:  64 year old female with hypoxia and altered mental status. EXAM: CT HEAD WITHOUT CONTRAST TECHNIQUE: Contiguous axial images were obtained from the base of the skull through the vertex without intravenous contrast. COMPARISON:  Head CT 09/28/2017. FINDINGS: Brain: No midline shift, ventriculomegaly, mass effect, evidence of mass lesion, intracranial hemorrhage or evidence of cortically based acute infarction. Scattered, patchy bilateral cerebral white matter hypodensity appears not significantly changed. Otherwise gray-white matter differentiation remains within normal limits. Vascular: Calcified atherosclerosis at the skull base. Skull: No acute osseous abnormality identified. Sinuses/Orbits: Visualized paranasal sinuses and mastoids are stable and well pneumatized. Other: Visualized orbits and scalp soft tissues are within normal limits. IMPRESSION: 1. No acute intracranial abnormality by CT. 2. Patchy chronic white matter changes, most commonly due to chronic small vessel disease. Electronically  Signed   By: Genevie Ann M.D.   On: 10/25/2019 16:10   CT Angio Chest PE W and/or Wo Contrast  Result Date: 10/24/2019 CLINICAL DATA:  Chest pain. EXAM: CT ANGIOGRAPHY CHEST WITH CONTRAST TECHNIQUE: Multidetector CT imaging of the chest was performed using the standard protocol during bolus administration of intravenous contrast. Multiplanar CT image reconstructions and MIPs were obtained to evaluate the vascular anatomy. CONTRAST:  152mL OMNIPAQUE IOHEXOL 350 MG/ML SOLN COMPARISON:  February 27, 2019. FINDINGS: Cardiovascular: Atherosclerosis of thoracic aorta is noted without aneurysm or dissection. Status post coronary bypass graft. The patient is status post right upper lobectomy. There is the interval development of thrombus within the most distal portion of the stump of the right upper lobe branch of the right pulmonary artery. No definite embolus is noted. Mild cardiomegaly is noted. No pericardial effusion is noted. Mediastinum/Nodes: Moderate size sliding-type hiatal hernia is noted. Thyroid gland is unremarkable. No adenopathy is noted. Lungs/Pleura: No pneumothorax is noted. Left lung is clear. Moderate size loculated right pleural effusion is noted with associated subsegmental atelectasis of the right lower lobe. Status post right upper lobectomy as described above. Upper Abdomen: No acute abnormality. Musculoskeletal: No chest wall abnormality. No acute or significant osseous findings. Review of the MIP images confirms the above findings. IMPRESSION: 1. Status post right upper lobectomy. Interval development of thrombus within the most distal portion of the residual stump of the right upper lobe branch of the right pulmonary artery. No definite pulmonary embolus is noted. 2. Moderate size loculated right pleural effusion is noted with associated subsegmental atelectasis of the right lower lobe. 3. Moderate size sliding-type hiatal hernia. 4. Aortic atherosclerosis. Aortic Atherosclerosis (ICD10-I70.0).  Electronically Signed   By: Marijo Conception M.D.   On: 10/24/2019 15:47   [place] CHEST (PLEURAL EFFUSION)  Result Date: 10/25/2019 CLINICAL DATA:  RIGHT pleural effusion by CT, history of COPD, GERD, RIGHT upper lobe lung cancer post upper lobectomy EXAM: CHEST ULTRASOUND COMPARISON:  CT angio chest 10/24/2019 FINDINGS: Small pleural effusion identified at the RIGHT lung base. Effusion is partially loculated. Atelectatic RIGHT lower lobe is adherent to the posterior chest  wall and prevents safe access of the inferior thoracic cavity for thoracentesis. This appearance was seen on the prior CT but appears fixed with change in position on ultrasound. IMPRESSION: Small loculated RIGHT pleural effusion, unable to safely access for thoracentesis. Electronically Signed   By: Lavonia Dana M.D.   On: 10/25/2019 11:00   DG Chest Portable 1 View  Result Date: 10/24/2019 CLINICAL DATA:  Shortness of breath with fever and chills EXAM: PORTABLE CHEST 1 VIEW COMPARISON:  May 25, 2019 FINDINGS: There is a small right pleural effusion. There is postoperative change with scarring on the right. There is no edema or airspace opacity. Heart is upper normal in size with pulmonary vascularity normal. Patient is status post internal mammary bypass grafting. No adenopathy. No bone lesions. IMPRESSION: Small right pleural effusion. Scarring with volume loss on the right. No frank edema or airspace opacity. Stable cardiac silhouette. Postoperative changes noted. Electronically Signed   By: Lowella Grip III M.D.   On: 10/24/2019 13:32     CBC Recent Labs  Lab 10/24/19 1316 10/25/19 0352 10/26/19 0534  WBC 12.4* 12.3* 18.6*  HGB 12.1 11.7* 10.4*  HCT 39.7 39.0 34.8*  PLT 224 215 173  MCV 89.4 91.3 91.6  MCH 27.3 27.4 27.4  MCHC 30.5 30.0 29.9*  RDW 15.3 15.3 15.2  LYMPHSABS 1.5  --   --   MONOABS 1.5*  --   --   EOSABS 0.0  --   --   BASOSABS 0.1  --   --     Chemistries  Recent Labs  Lab 10/24/19 1316  10/25/19 0352 10/26/19 0534 10/26/19 1115  NA 127* 129*  --  130*  K 5.8*   5.5* 5.7*  --  5.4*  CL 92* 92*  --  93*  CO2 27 27  --  28  GLUCOSE 158* 154*  --  147*  BUN 46* 33*  --  30*  CREATININE 1.17* 0.70 0.69 0.58  CALCIUM 8.2* 8.4*  --  7.9*  AST 46* 33  --  25  ALT 145* 123*  --  83*  ALKPHOS 114 108  --  86  BILITOT 1.1 0.8  --  0.8   ------------------------------------------------------------------------------------------------------------------ Recent Labs    10/24/19 1316  TRIG 151*    Lab Results  Component Value Date   HGBA1C 6.2 (H) 12/22/2017   ------------------------------------------------------------------------------------------------------------------ No results for input(s): TSH, T4TOTAL, T3FREE, THYROIDAB in the last 72 hours.  Invalid input(s): FREET3 ------------------------------------------------------------------------------------------------------------------ Recent Labs    10/24/19 1316  FERRITIN 13    Coagulation profile No results for input(s): INR, PROTIME in the last 168 hours.  Recent Labs    10/24/19 1316  DDIMER 5.27*    Cardiac Enzymes No results for input(s): CKMB, TROPONINI, MYOGLOBIN in the last 168 hours.  Invalid input(s): CK ------------------------------------------------------------------------------------------------------------------    Component Value Date/Time   BNP 905.0 (H) 10/25/2019 2133     [name] M.D on 10/26/2019 at 4:40 PM  Go to [email or web address] - for contact info  [facility] - Office  [phone]

## 2019-10-26 NOTE — Progress Notes (Signed)
ANTICOAGULATION CONSULT NOTE -   Pharmacy Consult for heparin IV Indication: Elevated troponin  No Known Allergies  Patient Measurements: Height: 5\' 5"  (165.1 cm) Weight: 90 kg (198 lb 6.6 oz) IBW/kg (Calculated) : 57 Heparin Dosing Weight: 73 kg  Vital Signs: Temp: 97.4 F (36.3 C) (09/10 0847) Temp Source: Axillary (09/10 0847) BP: 134/75 (09/10 1500) Pulse Rate: 85 (09/10 1500)  Labs: Recent Labs    10/24/19 1316 10/24/19 1316 10/24/19 1655 10/25/19 0352 10/25/19 1348 10/25/19 2133 10/26/19 0534 10/26/19 1115 10/26/19 1500  HGB 12.1   < >  --  11.7*  --   --  10.4*  --   --   HCT 39.7  --   --  39.0  --   --  34.8*  --   --   PLT 224  --   --  215  --   --  173  --   --   HEPARINUNFRC  --   --   --  1.10*   < > 0.63 0.73*  --  0.71*  CREATININE 1.17*   < >  --  0.70  --   --  0.69 0.58  --   TROPONINIHS 79*  --  84*  --   --   --   --   --   --    < > = values in this interval not displayed.    Estimated Creatinine Clearance: 76.7 mL/min (by C-G formula based on SCr of 0.58 mg/dL).   Medical History: Past Medical History:  Diagnosis Date  . Anxiety   . Asthma   . COPD (chronic obstructive pulmonary disease) ([place])   . Coronary artery disease    hx CABG  . GERD (gastroesophageal reflux disease)   . Peripheral vascular disease ([place])   . Pneumonia   . Shortness of breath dyspnea   . Stroke [facility])    "mini stroke" numbness and weakness to left side of the body    Medications:  Medications Prior to Admission  Medication Sig Dispense Refill Last Dose  . albuterol (PROVENTIL) (2.5 MG/3ML) 0.083% nebulizer solution Take 2.5 mg by nebulization every 4 (four) hours as needed.     . ALPRAZolam (XANAX) 0.5 MG tablet Take 0.5 mg by mouth 3 (three) times daily.    10/23/2019 at Unknown time  . Ascorbic Acid (VITAMIN C GUMMIE) 120 MG CHEW Chew 500 mg by mouth.   10/23/2019 at Unknown time  . aspirin 81 MG chewable tablet Chew 81 mg by mouth daily.   10/23/2019 at 0900   . atorvastatin (LIPITOR) 40 MG tablet Take 1 tablet (40 mg total) daily by mouth. 30 tablet 1 10/23/2019 at Unknown time  . calcium carbonate (OS-CAL) 1250 (500 Ca) MG chewable tablet Chew 1 tablet by mouth daily.   10/23/2019 at Unknown time  . cholecalciferol (VITAMIN D3) 25 MCG (1000 UNIT) tablet Take 1,000 Units by mouth daily.   10/23/2019 at Unknown time  . diphenhydramine-acetaminophen (TYLENOL PM) 25-500 MG TABS tablet Take 2-3 tablets by mouth at bedtime.   10/23/2019 at Unknown time  . isosorbide mononitrate (IMDUR) 30 MG 24 hr tablet Take 0.5 tablets (15 mg total) by mouth daily. 45 tablet 3 10/23/2019 at Unknown time  . metoprolol succinate (TOPROL-XL) 50 MG 24 hr tablet Take 50 mg by mouth daily. Take with or immediately following a meal.   10/23/2019 at 0900  . Multiple Vitamin (MULTIVITAMIN WITH MINERALS) TABS tablet Take 1 tablet by mouth daily.  10/23/2019 at Unknown time  . pantoprazole (PROTONIX) 40 MG tablet Take 40 mg by mouth daily.   10/23/2019 at Unknown time  . potassium chloride SA (K-DUR,KLOR-CON) 20 MEQ tablet Take 20 mEq by mouth daily.   2 10/23/2019 at Unknown time  . Tiotropium Bromide-Olodaterol (STIOLTO RESPIMAT) 2.5-2.5 MCG/ACT AERS Inhale 2 puffs into the lungs daily. 4 g 4 10/23/2019 at Unknown time  . torsemide (DEMADEX) 20 MG tablet Take 1 tablet (20 mg total) by mouth daily.   10/23/2019 at Unknown time    Assessment: IV heparin for elevated troponins and thrombus on CT (not PE). Hgb 12.1. Ddimer 5.27.  - 9/8 CT: s/p RU lobectomy. Interval development of thrombus within the most distal portion of the residual stump of the right upper lobe branch of the right pulmonary artery. No definite pulmonary embolus is noted.  Heparin level just slightly supratherapeutic at 0.71  Goal of Therapy:  Heparin level 0.3-0.7 units/ml Monitor platelets by anticoagulation protocol: Yes   Plan:  Decrease heparin infusion to 850 units/hr  Check anti-Xa level in 6 hours and daily while on  heparin Continue to monitor H&H and platelets  Isac Sarna, BS [place], BCPS Clinical Pharmacist Pager [phone]

## 2019-10-26 NOTE — Plan of Care (Signed)
  Problem: Acute Rehab PT Goals(only PT should resolve) Goal: Pt Will Go Supine/Side To Sit Outcome: Progressing Flowsheets (Taken 10/26/2019 1506) Pt will go Supine/Side to Sit: with supervision Goal: Patient Will Transfer Sit To/From Stand Outcome: Progressing Flowsheets (Taken 10/26/2019 1506) Patient will transfer sit to/from stand: with min guard assist Goal: Pt Will Transfer Bed To Chair/Chair To Bed Outcome: Progressing Flowsheets (Taken 10/26/2019 1506) Pt will Transfer Bed to Chair/Chair to Bed: min guard assist Goal: Pt Will Ambulate Outcome: Progressing Flowsheets (Taken 10/26/2019 1506) Pt will Ambulate:  50 feet  with min guard assist  with rolling walker   3:06 PM, 10/26/19 Lonell Grandchild, MPT Physical Therapist with [facility] [phone] office [phone] mobile phone

## 2019-10-26 NOTE — Evaluation (Signed)
Physical Therapy Evaluation Patient Details Name: Emily Rocha MRN: [id number] DOB: 1955-11-26 Today's Date: 10/26/2019   History of Present Illness  Emily Rocha is a 64 y.o. female with a past medical history of coronary artery disease status post CABG, adenocarcinoma of the lung status post VATS and right upper lobe lobectomy in September 2020, peripheral vascular disease, COPD who presented with shortness of breath.  She apparently had some fever chills over the last 1 week.  She has been having a cough with a yellowish expectoration.  She does not use oxygen at home.  Denies any chest pain but she mentions that she has been having "pleurisy" on the right side.  She denies any dizziness or lightheadedness.  None she has had some nausea and vomiting.  Denies any diarrhea.  No abdominal pain.    Clinical Impression  Patient appears slightly confused but cooperative requiring frequent repeated verbal/tactile cueing also possibly due to [facility].  Patient very unsteady on feet with frequent leaning on nearby objects for support and swaying of trunk during transfer to [facility], required use of RW for safety and limited for ambulation mostly due to fatigue while on 3 LPM O2 with SpO2 at 92%.  Patient put back to bed after therapy.  Patient will benefit from continued physical therapy in hospital and recommended venue below to increase strength, balance, endurance for safe ADLs and gait.    Follow Up Recommendations SNF    Equipment Recommendations  Rolling walker with 5" wheels    Recommendations for Other Services       Precautions / Restrictions Precautions Precautions: Fall Restrictions Weight Bearing Restrictions: No      Mobility  Bed Mobility Overal bed mobility: Needs Assistance Bed Mobility: Supine to Sit;Sit to Supine     Supine to sit: Min assist Sit to supine: Min guard   General bed mobility comments: increased time, labored movement  Transfers Overall transfer level: Needs  assistance Equipment used: Rolling walker (2 wheeled) Transfers: Sit to/from [facility] Sit to Stand: Min assist Stand pivot transfers: Min assist       General transfer comment: very unsteady on feet having to use RW for safety  Ambulation/Gait Ambulation/Gait assistance: Min assist Gait Distance (Feet): 35 Feet Assistive device: Rolling walker (2 wheeled) Gait Pattern/deviations: Decreased step length - right;Decreased step length - left;Decreased stride length Gait velocity: decreased   General Gait Details: slow unsteady cadence with frequent near falls without AD, required use of RW for safety, slow labored cadence with RW with diffiuclty making turns and frequent verbal/tactile cueing for safety  Stairs            Wheelchair Mobility    Modified Rankin (Stroke Patients Only)       Balance Overall balance assessment: Needs assistance Sitting-balance support: Feet supported;No upper extremity supported Sitting balance-Leahy Scale: Fair Sitting balance - Comments: fair/good seated at EOB   Standing balance support: During functional activity;No upper extremity supported Standing balance-Leahy Scale: Poor Standing balance comment: fair using RW                             Pertinent Vitals/Pain Pain Assessment: No/denies pain    Home Living Family/patient expects to be discharged to:: Private residence Living Arrangements: Spouse/significant other Available Help at Discharge: Family;Available PRN/intermittently Type of Home: House Home Access: Level entry     Home Layout: One level Home Equipment: Shower seat  Prior Function Level of Independence: Independent         Comments: Hydrographic surveyor, drives     Journalist, newspaper        Extremity/Trunk Assessment   Upper Extremity Assessment Upper Extremity Assessment: Generalized weakness    Lower Extremity Assessment Lower Extremity Assessment: Generalized  weakness    Cervical / Trunk Assessment Cervical / Trunk Assessment: Normal  Communication   Communication: HOH  Cognition Arousal/Alertness: Awake/alert Behavior During Therapy: Restless Overall Cognitive Status: No family/caregiver present to determine baseline cognitive functioning                                 General Comments: Appears slightly confused and hx of HOH      General Comments      Exercises     Assessment/Plan    PT Assessment Patient needs continued PT services  PT Problem List Decreased strength;Decreased activity tolerance;Decreased balance;Decreased mobility       PT Treatment Interventions Balance training;Gait training;Stair training;Functional mobility training;Therapeutic activities;Therapeutic exercise;Patient/family education    PT Goals (Current goals can be found in the Care Plan section)  Acute Rehab PT Goals Patient Stated Goal: return home with family to assist PT Goal Formulation: With patient Time For Goal Achievement: 11/09/19 Potential to Achieve Goals: Good    Frequency Min 3X/week   Barriers to discharge        Co-evaluation               AM-PAC PT "6 Clicks" Mobility  Outcome Measure Help needed turning from your back to your side while in a flat bed without using bedrails?: None Help needed moving from lying on your back to sitting on the side of a flat bed without using bedrails?: A Little Help needed moving to and from a bed to a chair (including a wheelchair)?: A Little Help needed standing up from a chair using your arms (e.g., wheelchair or bedside chair)?: A Little Help needed to walk in hospital room?: A Lot Help needed climbing 3-5 steps with a railing? : A Lot 6 Click Score: 17    End of Session Equipment Utilized During Treatment: Oxygen Activity Tolerance: Patient tolerated treatment well;Patient limited by fatigue Patient left: in bed;with call bell/phone within reach Nurse  Communication: Mobility status PT Visit Diagnosis: Unsteadiness on feet (R26.81);Other abnormalities of gait and mobility (R26.89);Muscle weakness (generalized) (M62.81)    Time: 5916-3846 PT Time Calculation (min) (ACUTE ONLY): 30 min   Charges:   PT Evaluation $PT Eval Moderate Complexity: 1 Mod PT Treatments $Therapeutic Activity: 23-37 mins        3:04 PM, 10/26/19 Lonell Grandchild, MPT Physical Therapist with [facility] 336 [phone] office [phone] mobile phone

## 2019-10-26 NOTE — Progress Notes (Signed)
ANTICOAGULATION CONSULT NOTE -   Pharmacy Consult for heparin IV Indication: Elevated troponin  No Known Allergies  Patient Measurements: Height: 5\' 5"  (165.1 cm) Weight: 90 kg (198 lb 6.6 oz) IBW/kg (Calculated) : 57 Heparin Dosing Weight: 73 kg  Vital Signs: Temp: 98.2 F (36.8 C) (09/10 0400) Temp Source: Axillary (09/10 0400) BP: 153/67 (09/10 0600) Pulse Rate: 95 (09/10 0600)  Labs: Recent Labs    10/24/19 1316 10/24/19 1316 10/24/19 1655 10/25/19 0352 10/25/19 0352 10/25/19 1348 10/25/19 2133 10/26/19 0534  HGB 12.1   < >  --  11.7*  --   --   --  10.4*  HCT 39.7  --   --  39.0  --   --   --  34.8*  PLT 224  --   --  215  --   --   --  173  HEPARINUNFRC  --   --   --  1.10*   < > 0.70 0.63 0.73*  CREATININE 1.17*  --   --  0.70  --   --   --  0.69  TROPONINIHS 79*  --  84*  --   --   --   --   --    < > = values in this interval not displayed.    Estimated Creatinine Clearance: 76.7 mL/min (by C-G formula based on SCr of 0.69 mg/dL).   Medical History: Past Medical History:  Diagnosis Date  . Anxiety   . Asthma   . COPD (chronic obstructive pulmonary disease) ([place])   . Coronary artery disease    hx CABG  . GERD (gastroesophageal reflux disease)   . Peripheral vascular disease ([place])   . Pneumonia   . Shortness of breath dyspnea   . Stroke [facility])    "mini stroke" numbness and weakness to left side of the body    Medications:  Medications Prior to Admission  Medication Sig Dispense Refill Last Dose  . albuterol (PROVENTIL) (2.5 MG/3ML) 0.083% nebulizer solution Take 2.5 mg by nebulization every 4 (four) hours as needed.     . ALPRAZolam (XANAX) 0.5 MG tablet Take 0.5 mg by mouth 3 (three) times daily.    10/23/2019 at Unknown time  . Ascorbic Acid (VITAMIN C GUMMIE) 120 MG CHEW Chew 500 mg by mouth.   10/23/2019 at Unknown time  . aspirin 81 MG chewable tablet Chew 81 mg by mouth daily.   10/23/2019 at 0900  . atorvastatin (LIPITOR) 40 MG tablet Take 1  tablet (40 mg total) daily by mouth. 30 tablet 1 10/23/2019 at Unknown time  . calcium carbonate (OS-CAL) 1250 (500 Ca) MG chewable tablet Chew 1 tablet by mouth daily.   10/23/2019 at Unknown time  . cholecalciferol (VITAMIN D3) 25 MCG (1000 UNIT) tablet Take 1,000 Units by mouth daily.   10/23/2019 at Unknown time  . diphenhydramine-acetaminophen (TYLENOL PM) 25-500 MG TABS tablet Take 2-3 tablets by mouth at bedtime.   10/23/2019 at Unknown time  . isosorbide mononitrate (IMDUR) 30 MG 24 hr tablet Take 0.5 tablets (15 mg total) by mouth daily. 45 tablet 3 10/23/2019 at Unknown time  . metoprolol succinate (TOPROL-XL) 50 MG 24 hr tablet Take 50 mg by mouth daily. Take with or immediately following a meal.   10/23/2019 at 0900  . Multiple Vitamin (MULTIVITAMIN WITH MINERALS) TABS tablet Take 1 tablet by mouth daily.   10/23/2019 at Unknown time  . pantoprazole (PROTONIX) 40 MG tablet Take 40 mg by mouth daily.  10/23/2019 at Unknown time  . potassium chloride SA (K-DUR,KLOR-CON) 20 MEQ tablet Take 20 mEq by mouth daily.   2 10/23/2019 at Unknown time  . Tiotropium Bromide-Olodaterol (STIOLTO RESPIMAT) 2.5-2.5 MCG/ACT AERS Inhale 2 puffs into the lungs daily. 4 g 4 10/23/2019 at Unknown time  . torsemide (DEMADEX) 20 MG tablet Take 1 tablet (20 mg total) by mouth daily.   10/23/2019 at Unknown time    Assessment: IV heparin for elevated troponins and thrombus on CT (not PE). Hgb 12.1. Ddimer 5.27.  - 9/8 CT: s/p RU lobectomy. Interval development of thrombus within the most distal portion of the residual stump of the right upper lobe branch of the right pulmonary artery. No definite pulmonary embolus is noted.  Heparin level supratherapeutic at 0.73  Goal of Therapy:  Heparin level 0.3-0.7 units/ml Monitor platelets by anticoagulation protocol: Yes   Plan:  Decrease heparin infusion to 950 units/hr  Check anti-Xa level in 6 hours and daily while on heparin Continue to monitor H&H and platelets  Isac Sarna, BS Vena Austria, BCPS Clinical Pharmacist Pager [phone]

## 2019-10-26 NOTE — Care Management Important Message (Signed)
Important Message  Patient Details  Name: Emily Rocha MRN: [id number] Date of Birth: 03/16/1955   Medicare Important Message Given:  Yes     Tommy Medal 10/26/2019, 1:49 PM

## 2019-10-27 ENCOUNTER — Inpatient Hospital Stay (HOSPITAL_COMMUNITY): Payer: Medicare HMO

## 2019-10-27 ENCOUNTER — Other Ambulatory Visit (HOSPITAL_COMMUNITY): Payer: Medicare HMO

## 2019-10-27 DIAGNOSIS — I361 Nonrheumatic tricuspid (valve) insufficiency: Secondary | ICD-10-CM

## 2019-10-27 DIAGNOSIS — I340 Nonrheumatic mitral (valve) insufficiency: Secondary | ICD-10-CM

## 2019-10-27 DIAGNOSIS — J90 Pleural effusion, not elsewhere classified: Secondary | ICD-10-CM

## 2019-10-27 LAB — COMPREHENSIVE METABOLIC PANEL
ALT: 68 U/L — ABNORMAL HIGH (ref 0–44)
AST: 23 U/L (ref 15–41)
Albumin: 3.4 g/dL — ABNORMAL LOW (ref 3.5–5.0)
Alkaline Phosphatase: 78 U/L (ref 38–126)
Anion gap: 10 (ref 5–15)
BUN: 27 mg/dL — ABNORMAL HIGH (ref 8–23)
CO2: 29 mmol/L (ref 22–32)
Calcium: 8.2 mg/dL — ABNORMAL LOW (ref 8.9–10.3)
Chloride: 93 mmol/L — ABNORMAL LOW (ref 98–111)
Creatinine, Ser: 0.51 mg/dL (ref 0.44–1.00)
GFR calc Af Amer: >60 mL/min (ref >60)
GFR calc non Af Amer: >60 mL/min (ref >60)
Glucose, Bld: 154 mg/dL — ABNORMAL HIGH (ref 70–99)
Potassium: 5.6 mmol/L — ABNORMAL HIGH (ref 3.5–5.1)
Sodium: 132 mmol/L — ABNORMAL LOW (ref 135–145)
Total Bilirubin: 1 mg/dL (ref 0.3–1.2)
Total Protein: 6.5 g/dL (ref 6.5–8.1)

## 2019-10-27 LAB — BLOOD GAS, ARTERIAL
Acid-Base Excess: 6.3 mmol/L — ABNORMAL HIGH (ref 0.0–2.0)
Bicarbonate: 32.1 mmol/L — ABNORMAL HIGH (ref 20.0–28.0)
Drawn by: 12971
FIO2: 32
O2 Saturation: 96.8 %
Patient temperature: 37
pCO2 arterial: 64.1 mmHg — ABNORMAL HIGH (ref 32.0–48.0)
pH, Arterial: 7.32 — ABNORMAL LOW (ref 7.350–7.450)
pO2, Arterial: 91.8 mmHg (ref 83.0–108.0)

## 2019-10-27 LAB — CBC
HCT: 36.2 % (ref 36.0–46.0)
Hemoglobin: 10.7 g/dL — ABNORMAL LOW (ref 12.0–15.0)
MCH: 27.3 pg (ref 26.0–34.0)
MCHC: 29.6 g/dL — ABNORMAL LOW (ref 30.0–36.0)
MCV: 92.3 fL (ref 80.0–100.0)
Platelets: 144 10*3/uL — ABNORMAL LOW (ref 150–400)
RBC: 3.92 MIL/uL (ref 3.87–5.11)
RDW: 15.6 % — ABNORMAL HIGH (ref 11.5–15.5)
WBC: 9.9 10*3/uL (ref 4.0–10.5)
nRBC: 1.8 % — ABNORMAL HIGH (ref 0.0–0.2)

## 2019-10-27 LAB — HEPARIN LEVEL (UNFRACTIONATED): Heparin Unfractionated: 0.64 [IU]/mL (ref 0.30–0.70)

## 2019-10-27 LAB — GLUCOSE, CAPILLARY: Glucose-Capillary: 127 mg/dL — ABNORMAL HIGH (ref 70–99)

## 2019-10-27 LAB — ECHOCARDIOGRAM LIMITED
Height: 65 in
Weight: 3146.41 [oz_av]

## 2019-10-27 MED ORDER — HALOPERIDOL LACTATE 5 MG/ML IJ SOLN
5.0000 mg | Freq: Once | INTRAMUSCULAR | Status: AC
  Administered 2019-10-27: 5 mg via INTRAVENOUS
  Filled 2019-10-27: qty 1

## 2019-10-27 MED ORDER — LORAZEPAM 2 MG/ML IJ SOLN
1.0000 mg | Freq: Two times a day (BID) | INTRAMUSCULAR | Status: DC | PRN
  Administered 2019-10-27 – 2019-10-28 (×2): 1 mg via INTRAVENOUS
  Filled 2019-10-27 (×3): qty 1

## 2019-10-27 MED ORDER — QUETIAPINE FUMARATE 25 MG PO TABS
25.0000 mg | ORAL_TABLET | Freq: Every day | ORAL | Status: DC
  Administered 2019-10-27 – 2019-11-05 (×10): 25 mg via ORAL
  Filled 2019-10-27 (×10): qty 1

## 2019-10-27 MED ORDER — SODIUM ZIRCONIUM CYCLOSILICATE 10 G PO PACK
10.0000 g | PACK | Freq: Once | ORAL | Status: AC
  Administered 2019-10-27: 10 g via ORAL
  Filled 2019-10-27: qty 1

## 2019-10-27 MED ORDER — SODIUM CHLORIDE 0.9 % IV SOLN: INTRAVENOUS | Status: DC

## 2019-10-27 NOTE — Progress Notes (Signed)
In from [place] by care link awake and alert. Attempted to pull out iv line, mittens applied bil. TCTS PA made aware about pt's arrival in the unit.

## 2019-10-27 NOTE — Progress Notes (Signed)
Cannot keep still , kept pulling out cardiac leads.  Sitter at bedside.

## 2019-10-27 NOTE — Progress Notes (Signed)
Emily Cutter PA notified that patient has arrived to 2 Central room 7

## 2019-10-27 NOTE — Consult Note (Signed)
[address]       [place] [address]             [phone]                    Jone H Pete [place] Medical Record #[id number] Date of Birth: 1955-05-10  Referring: No ref. provider found Primary Care: Glenda Chroman, MD Primary Cardiologist: No primary care provider on file.  Chief Complaint:    Chief Complaint  Patient presents with  . Shortness of Breath    History of Present Illness:    Emily Rocha 64 y.o. female well-known to our service for recent right VATS right upper lobectomy for non-small cell cancer, and coronary artery bypass grafting with left subclavian bypass by Dr. Prescott Gum in 2018. She is transferred from [facility] for surgical management of a parapneumonic right-sided effusion. On discussion with her husband about 5 days ago she started having some nausea and emesis which was then followed by a cough and confusion. She was noted to be hypoxemic with sats down into the 50s to 60s which prompted them to call EMS for transport to the emergency department. Cross-sectional imaging in [facility] showed a loculated right-sided effusion and a moderate sized hiatal hernia. Attempted catheter-based drainage was performed and unsuccessful, and she was thus transferred to [facility] for further management.  She has been confused since she has been in the hospital.         Past Medical History:  Diagnosis Date  . Anxiety   . Asthma   . COPD (chronic obstructive pulmonary disease) ([place])   . Coronary artery disease    hx CABG  . GERD (gastroesophageal reflux disease)   . Peripheral vascular disease ([place])   . Pneumonia   . Shortness of breath dyspnea   . Stroke [facility])    "mini stroke" numbness and weakness to left side of the body    Past Surgical History:  Procedure Laterality Date  . AORTIC ARCH ANGIOGRAPHY N/A 11/04/2017   Procedure: AORTIC ARCH ANGIOGRAPHY;  Surgeon: Elam Dutch, MD;  Location: [place] [facility];   Service: Cardiovascular;  Laterality: N/A;  . APPLICATION OF WOUND VAC N/A 01/06/2018   Procedure: APPLICATION OF WOUND VAC;  Surgeon: Ivin Poot, MD;  Location: [place];  Service: Thoracic;  Laterality: N/A;  . APPLICATION OF WOUND VAC N/A 01/13/2018   Procedure: WOUND VAC CHANGE;  Surgeon: Ivin Poot, MD;  Location: [place];  Service: Thoracic;  Laterality: N/A;  . CARDIAC CATHETERIZATION    . CAROTID-SUBCLAVIAN BYPASS GRAFT Right 12/26/2017   Procedure: AORTIC TO RIGHT COMMON CAROTID AND RIGHT SUBCLAVIAN  ARTERY  BYPASS;  Surgeon: Elam Dutch, MD;  Location: [place];  Service: Vascular;  Laterality: Right;  . CHOLECYSTECTOMY    . CLOSURE OF DIAPHRAGM  12/26/2017   Procedure: REPAIR OF DIAPHRAGM;  Surgeon: Ivin Poot, MD;  Location: [place];  Service: Thoracic;;  . COLONOSCOPY N/A 10/03/2014   Procedure: COLONOSCOPY;  Surgeon: Rogene Houston, MD;  [facility];  Service: Endoscopy;  Laterality: N/A;  730  . CORONARY ARTERY BYPASS GRAFT N/A 12/14/2016   Procedure: CORONARY ARTERY BYPASS GRAFTING (CABG) x three , using left internal mammary artery and right leg greater saphenous vein harvested endoscopically;  Surgeon: Ivin Poot, MD;  Location: [place];  Service: Open Heart Surgery;  Laterality: N/A;  . ENDARTERECTOMY Right  12/06/2014   Procedure: ENDARTERECTOMY CAROTid;  Surgeon: Elam Dutch, MD;  Location: [facility];  Service: Vascular;  Laterality: Right;  . ESOPHAGOGASTRODUODENOSCOPY N/A 12/03/2016   Procedure: ESOPHAGOGASTRODUODENOSCOPY (EGD);  Surgeon: Rogene Houston, MD;  [facility];  Service: Endoscopy;  Laterality: N/A;  7:30  . LEFT HEART CATH AND CORONARY ANGIOGRAPHY N/A 12/14/2016   Procedure: LEFT HEART CATH AND CORONARY ANGIOGRAPHY;  Surgeon: Burnell Blanks, MD;  Location: [place] [facility];  Service: Cardiovascular;  Laterality: N/A;  . LOBECTOMY Right 10/26/2018   Procedure: RIGHT UPPER LOBECTOMY;  Surgeon: Lajuana Matte, MD;  Location: [place];  Service: Thoracic;  Laterality: Right;  . PERIPHERAL VASCULAR CATHETERIZATION N/A 12/05/2014   Procedure:  Carotid Arch Angiography;  Surgeon: Conrad [place], MD;  Location: [place] [facility];  Service: Cardiovascular;  Laterality: N/A;  . STERNAL WOUND DEBRIDEMENT N/A 01/06/2018   Procedure: STERNAL WOUND DEBRIDEMENT;  Surgeon: Ivin Poot, MD;  Location: [place];  Service: Thoracic;  Laterality: N/A;  . STERNOTOMY N/A 12/26/2017   Procedure: REDO STERNOTOMY;  Surgeon: Prescott Gum, Collier Salina, MD;  Location: [place];  Service: Thoracic;  Laterality: N/A;  . TEE WITHOUT CARDIOVERSION N/A 12/14/2016   Procedure: TRANSESOPHAGEAL ECHOCARDIOGRAM (TEE);  Surgeon: Prescott Gum, Collier Salina, MD;  Location: [place];  Service: Open Heart Surgery;  Laterality: N/A;  . VIDEO ASSISTED THORACOSCOPY (VATS)/WEDGE RESECTION Right 10/26/2018   Procedure: VIDEO ASSISTED THORACOSCOPY (VATS)/WEDGE RESECTION;  Surgeon: Lajuana Matte, MD;  Location: [place];  Service: Thoracic;  Laterality: Right;  Marland Kitchen VIDEO BRONCHOSCOPY N/A 10/26/2018   Procedure: VIDEO BRONCHOSCOPY;  Surgeon: Lajuana Matte, MD;  Location: [facility];  Service: Thoracic;  Laterality: N/A;    Family History  Problem Relation Age of Onset  . Heart attack Mother   . Cancer Mother   . Diabetes Mother   . Heart disease Mother   . Heart attack Father   . Heart disease Father      Social History   Tobacco Use  Smoking Status Former Smoker  . Packs/day: 2.00  . Years: 40.00  . Pack years: 80.00  . Quit date: 10/24/2015  . Years since quitting: 4.0  Smokeless Tobacco Never Used    Social History   Substance and Sexual Activity  Alcohol Use No  . Alcohol/week: 0.0 standard drinks     No Known Allergies  Current Facility-Administered Medications  Medication Dose Route Frequency Provider Last Rate Last Admin  . acetaminophen (TYLENOL) tablet 650 mg  650 mg Oral Q6H PRN Bonnielee Haff, MD   650 mg at 10/26/19 2353    Or  . acetaminophen (TYLENOL) suppository 650 mg  650 mg Rectal Q6H PRN Bonnielee Haff, MD      . albuterol (PROVENTIL) (2.5 MG/3ML) 0.083% nebulizer solution 2.5 mg  2.5 mg Nebulization Q2H PRN Bonnielee Haff, MD      . ALPRAZolam Duanne Moron) tablet 0.25 mg  0.25 mg Oral BID PRN Bonnielee Haff, MD      . ALPRAZolam Duanne Moron) tablet 0.5 mg  0.5 mg Oral BID PRN [name], MD   0.5 mg at 10/26/19 1942  . Ampicillin-Sulbactam (UNASYN) 3 g in sodium chloride 0.9 % 100 mL IVPB  3 g Intravenous Q8H Robertson, Crystal S, RPH 200 mL/hr at 10/27/19 0338 3 g at 10/27/19 0338  . aspirin chewable tablet 81 mg  81 mg Oral Daily Bonnielee Haff, MD   81 mg at 10/27/19 0953  . azithromycin (ZITHROMAX) tablet 500  mg  500 mg Oral Daily Bonnielee Haff, MD   500 mg at 10/27/19 0952  . Chlorhexidine Gluconate Cloth 2 % PADS 6 each  6 each Topical Daily [name], MD   6 each at 10/26/19 0859  . Chlorhexidine Gluconate Cloth 2 % PADS 6 each  6 each Topical Q0600 Denton Brick, Courage, MD   6 each at 10/27/19 0508  . haloperidol lactate (HALDOL) injection 5 mg  5 mg Intramuscular Q6H PRN Emokpae, Courage, MD   5 mg at 10/26/19 2353  . heparin ADULT infusion 100 units/mL (25000 units/221mL sodium chloride 0.45%)  950 Units/hr Intravenous Continuous Kris Mouton, RPH 9.5 mL/hr at 10/27/19 0900 950 Units/hr at 10/27/19 0900  . ipratropium-albuterol (DUONEB) 0.5-2.5 (3) MG/3ML nebulizer solution 3 mL  3 mL Nebulization QID Bonnielee Haff, MD   3 mL at 10/26/19 1954  . isosorbide mononitrate (IMDUR) 24 hr tablet 15 mg  15 mg Oral Daily Bonnielee Haff, MD   15 mg at 10/27/19 0952  . methylPREDNISolone sodium succinate (SOLU-MEDROL) 40 mg/mL injection 40 mg  40 mg Intravenous Q12H Emokpae, Courage, MD   40 mg at 10/27/19 0508  . metoprolol succinate (TOPROL-XL) 24 hr tablet 50 mg  50 mg Oral Daily Bonnielee Haff, MD   50 mg at 10/27/19 0953  . mometasone-formoterol (DULERA) 200-5 MCG/ACT inhaler 2 puff  2 puff  Inhalation BID Bonnielee Haff, MD   2 puff at 10/26/19 1954  . mupirocin ointment (BACTROBAN) 2 % 1 application  1 application Nasal BID [name], MD   1 application at 89/21/19 0959  . ondansetron (ZOFRAN) tablet 4 mg  4 mg Oral Q6H PRN Bonnielee Haff, MD       Or  . ondansetron [facility]) injection 4 mg  4 mg Intravenous Q6H PRN Bonnielee Haff, MD      . pantoprazole (PROTONIX) EC tablet 40 mg  40 mg Oral Daily Bonnielee Haff, MD   40 mg at 10/27/19 0953  . vancomycin (VANCOCIN) IVPB 1000 mg/200 mL premix  1,000 mg Intravenous Q12H Emokpae, Courage, MD 200 mL/hr at 10/27/19 0509 1,000 mg at 10/27/19 0509    Review of Systems  Unable to perform ROS: Mental acuity     PHYSICAL EXAMINATION: BP 140/79   Pulse 90   Temp 98.3 F (36.8 C) (Oral)   Resp (!) 22   Ht 5\' 5"  (1.651 m)   Wt 89.2 kg   SpO2 98%   BMI 32.72 kg/m  Physical Exam Constitutional:      Appearance: She is ill-appearing. She is not toxic-appearing.  HENT:     Head: Normocephalic and atraumatic.  Cardiovascular:     Rate and Rhythm: Normal rate.  Pulmonary:     Effort: Tachypnea present. No respiratory distress.  Abdominal:     Palpations: Abdomen is soft.  Musculoskeletal:        General: Normal range of motion.  Skin:    General: Skin is warm and dry.  Neurological:     General: No focal deficit present.     Mental Status: She is alert. She is disoriented.         I have independently reviewed the above radiology studies  and reviewed the findings with the patient.   Recent Lab Findings: Lab Results  Component Value Date   WBC 9.9 10/27/2019   HGB 10.7 (L) 10/27/2019   HCT 36.2 10/27/2019   PLT 144 (L) 10/27/2019   GLUCOSE 154 (H) 10/27/2019   CHOL 205 (H)  12/13/2016   TRIG 151 (H) 10/24/2019   HDL 68 12/13/2016   LDLCALC 117 (H) 12/13/2016   ALT 68 (H) 10/27/2019   AST 23 10/27/2019   NA 132 (L) 10/27/2019   K 5.6 (H) 10/27/2019   CL 93 (L) 10/27/2019   CREATININE 0.51  10/27/2019   BUN 27 (H) 10/27/2019   CO2 29 10/27/2019   INR 1.0 10/24/2018   HGBA1C 6.2 (H) 12/22/2017       Assessment / Plan:   64 year old female with a right-sided parapneumonic effusion in the setting of a moderate sized hiatal hernia. She also has a significant coronary history as well as a history of lung cancer in the status post right upper lobectomy. I personally reviewed her CT scan and she does have a loculated effusion with 2 areas 1 in the apex and one in the base. The basilar section is the largest effusion. She also has a moderate sized hiatal hernia. I think that she likely aspirated given her history and presents with a hiatal hernia. Have spoken to her husband and recommended that she undergo a CT-guided drain of the largest effusion, and once her mental status is improved she likely will require a hiatal hernia repair to prevent further risk of aspiration in the future. He is agreeable to proceed. I will place the order for the CT-guided drain.     I  spent 30 minutes with  the patient face to face and greater then 50% of the time was spent in counseling and coordination of care.    Lajuana Matte 10/27/2019 11:36 AM

## 2019-10-27 NOTE — Progress Notes (Signed)
MD made aware of pt's constant restlessness and confusion. Haldol 5 mg iv given . Continue to monitor.

## 2019-10-27 NOTE — Progress Notes (Signed)
MD made aware with ABG result with order to try putting pt on bipap. Resp. therapist made aware.

## 2019-10-27 NOTE — Progress Notes (Signed)
64 year old femalewith a past medical history of coronary artery disease status post CABG, adenocarcinoma of the lung status post VATS and right upper lobe lobectomy admitted on 10/24/2019 with acute hypoxic and hypercapnic respiratory failure -Radiology at [facility] unsuccessfully tried under ultrasound to aspirate loculated right-sided pleural effusion -Need to rule out infection/possible empyema versus malignant effusion Transferred to [facility] for TCTS evaluation.    Pt seen , she appears very restless. Safety sitter at bedside, along with haldol for agitation and prn ativan.    Hosie Poisson, MD

## 2019-10-27 NOTE — Progress Notes (Signed)
Patient Demographics:    Emily Rocha, is a 64 y.o. female, DOB - 22-Oct-1955, HMC:[id number]  Admit date - 10/24/2019   Admitting Physician Bonnielee Haff, MD  Outpatient Primary MD for the patient is Glenda Chroman, MD  LOS - 3   Chief Complaint  Patient presents with  . Shortness of Breath        Subjective:    [name] today has no fevers, no emesis,   -Patient remains confused and disoriented -Hypoxia persist  transfer to [facility] for [facility] surgery eval Son at bedside -  Assessment  & Plan :    Principal Problem:   Acute respiratory failure with hypoxia ([place]) Active Problems:   Rt Sided Pneumonia with loculated parapneumonic effusion   History of lung cancer- Non-Small cell/S/p Prior VATs   CAD (coronary artery disease)/Prior CABG   Rt Sided ParaPneumonic Pleural effusion   Acute respiratory failure [facility])  Brief Summary:- 64 y.o. female with a past medical history of coronary artery disease status post CABG, adenocarcinoma of the lung status post VATS and right upper lobe lobectomy admitted on 10/24/2019 with acute hypoxic and hypercapnic respiratory failure -Radiology at [facility] unsuccessfully tried under ultrasound to aspirate loculated right-sided pleural effusion -Need to rule out infection/possible empyema versus malignant effusion -Pulmonology consult from Dr. Melvyn Novas appreciated--he recommends transfer to [facility] for [place] surgery evaluation -CT surgeon Dr. Kipp Brood has reviewed patient's chest imaging studies on chart Dr. Kipp Brood  (cardiothoracic surgeon ) said Emily Rocha  can be transferred under hospitalist to [place] and he will see her once she arrives. He's on call all weekend so he will be around.  A/p 1)H/o CAD -Now with Elevated Troponin--- most likely due to demand ischemia in the setting of respiratory failure with hypoxia and moderately severe aortic stenosis -- CTA  chest also shows interval development of thrombus within the most distal portion of the residual stump of the right upper lobe branch of the right pulmonary artery. No definite pulmonary embolus is noted. -Continue metoprolol, aspirin and isosorbide -Await echo -Please consult cardiology in the a.m. for recommendations about possibly stopping IV heparin on 10/28/19  2)Rt sided pleuritic chest pain /right-sided loculated parapneumonic pleural effusion---- -CTA chest shows  loculated effusion  -Unsuccessful attempt at ultrasound-guided thoracentesis on 10/25/2019 on the pain -s/p RULobectomy/VATs for Stage I non-small cell lung ca by Dr. Kipp Brood -CT surgery consult requested--awaiting further input from CT surgery ddx = malignant effusion vs  empyema   --MRSA screen is positive Blood cultures NGTD WBC 12.4>>12.3 >>18.6 (steroids) >>9.9 (leukocytosis appears to have resolved) -Continue Unasyn, azithromycin and vancomycin -CT surgeon Dr. Kipp Brood has reviewed patient's chest imaging studies on chart Dr. Kipp Brood  (cardiothoracic surgeon ) said Emily Rocha  can be transferred under hospitalist to [facility] and he will see her once she arrives.  3) acute resp failure hypercabic/hypoxemic assoc with I  Plus GOLD 3 copd  -Continue bronchodilators and supplemental oxygen -CT surgery to help with management of right-sided parapneumonic effusion  4) hyponatremia-suspect hypovolemic hyponatremia---improving with IV fluids Na 127>>129>>130>>132  5) acute metabolic encephalopathy----secondary to #3 and #4 above --- Haldol and lorazepam as needed -CT head without acute findings -Per husband patient is not a drinker -Episodes of  confusion and disorientation persist suspect due to underlying infection  6)Transaminitis--- ALT particularly, hold statin therapy ALT 145>>123>>83>>68  7) persistent hyperkalemia----Lokelma as prescribed, repeat BMP in a.m.  8) social/ethics--- advanced directives and  plan of care discussed with patient's husband and patient's son, patient is a full code  Disposition/Need for in-Hospital Stay- patient unable to be discharged at this time due to -Radiology at [facility] unsuccessfully tried under ultrasound to aspirate loculated right-sided pleural effusion -Need to rule out infection/possible empyema versus malignant effusion -Pulmonology consult from Dr. Melvyn Novas appreciated--he recommends transfer to [facility] for CT surgery evaluation - awaiting transfer to [facility] for [facility] surgery eval  Status is: Inpatient  Remains inpatient appropriate because:Loculated possibly infected right-sided pleural effusion requiring IV antibiotics and CT surgery intervention   -  Disposition: The patient is from: Home              Anticipated d/c is to: Home              Anticipated d/c date is: > 3 days              Patient currently is not medically stable to d/c. Barriers: Not Clinically Stable- Loculated possibly infected right-sided pleural effusion requiring IV antibiotics and CT surgery intervention  awaiting transfer to [facility] for [facility] surgery eval  Code Status : Full code  Family Communication: Discussed with Husband  Also Discussed with son Consults  :  PCCM/CT Surgery  DVT Prophylaxis  :   Iv Heparin/ - SCDs   Lab Results  Component Value Date   PLT 144 (L) 10/27/2019    Inpatient Medications  Scheduled Meds: . aspirin  81 mg Oral Daily  . azithromycin  500 mg Oral Daily  . Chlorhexidine Gluconate Cloth  6 each Topical Daily  . Chlorhexidine Gluconate Cloth  6 each Topical Q0600  . ipratropium-albuterol  3 mL Nebulization QID  . isosorbide mononitrate  15 mg Oral Daily  . methylPREDNISolone (SOLU-MEDROL) injection  40 mg Intravenous Q12H  . metoprolol succinate  50 mg Oral Daily  . mometasone-formoterol  2 puff Inhalation BID  . mupirocin ointment  1 application Nasal BID  . pantoprazole  40 mg Oral Daily  . sodium zirconium cyclosilicate   10 g Oral Once  . sodium zirconium cyclosilicate  10 g Oral Once   Continuous Infusions: . ampicillin-sulbactam (UNASYN) IV 3 g (10/27/19 0338)  . heparin 950 Units/hr (10/27/19 0109)  . vancomycin 1,000 mg (10/27/19 0509)   PRN Meds:.acetaminophen **OR** acetaminophen, albuterol, ALPRAZolam, ALPRAZolam, haloperidol lactate, ondansetron **OR** ondansetron (ZOFRAN) IV    Anti-infectives (From admission, onward)   Start     Dose/Rate Route Frequency Ordered Stop   10/26/19 0600  vancomycin (VANCOCIN) IVPB 1000 mg/200 mL premix        1,000 mg 200 mL/hr over 60 Minutes Intravenous Every 12 hours 10/25/19 1654     10/25/19 1715  vancomycin (VANCOREADY) IVPB 2000 mg/400 mL        2,000 mg 200 mL/hr over 120 Minutes Intravenous  Once 10/25/19 1652 10/25/19 2000   10/24/19 2030  Ampicillin-Sulbactam (UNASYN) 3 g in sodium chloride 0.9 % 100 mL IVPB        3 g 200 mL/hr over 30 Minutes Intravenous Every 8 hours 10/24/19 1949     10/24/19 2000  azithromycin (ZITHROMAX) tablet 500 mg        500 mg Oral Daily 10/24/19 1923 10/29/19 0959  Objective:   Vitals:   10/27/19 0400 10/27/19 0500 10/27/19 0600 10/27/19 0755  BP: (!) 142/51 (!) 132/53 (!) 164/64   Pulse: 85 83 85 88  Resp: 17 17 17 16   Temp: 97.9 F (36.6 C)   98.9 F (37.2 C)  TempSrc: Axillary   Oral  SpO2: 100% 94% 90% 98%  Weight:      Height:        Wt Readings from Last 3 Encounters:  10/27/19 89.2 kg  10/18/19 85.8 kg  10/01/19 85.7 kg     Intake/Output Summary (Last 24 hours) at 10/27/2019 0804 Last data filed at 10/27/2019 0109 Gross per 24 hour  Intake 1442.67 ml  Output 1000 ml  Net 442.67 ml    Physical Exam  Gen:- Awake Alert, intermittently confused HEENT:- [place].AT, No sclera icterus Nose- 3L/min Neck-Supple Neck,No JVD,.  Lungs-diminished on the right, scattered rhonchi  CV- S1, S2 normal, regular, 3/6 sm Abd-  +ve B.Sounds, Abd Soft, No tenderness,    Extremity/Skin:- No  edema, pedal  pulses present Psych-intermittent confusion and disorientation requiring wrist restraints neuro-generalized weakness no new focal deficits, no tremors   Data Review:   Micro Results Recent Results (from the past 240 hour(s))  SARS Coronavirus 2 by RT PCR (hospital order, performed in [facility] hospital lab) Nasopharyngeal Nasopharyngeal Swab     Status: None   Collection Time: 10/24/19  1:12 PM   Specimen: Nasopharyngeal Swab  Result Value Ref Range Status   SARS Coronavirus 2 NEGATIVE NEGATIVE Final    Comment: (NOTE) SARS-CoV-2 target nucleic acids are NOT DETECTED.  The SARS-CoV-2 RNA is generally detectable in upper and lower respiratory specimens during the acute phase of infection. The lowest concentration of SARS-CoV-2 viral copies this assay can detect is 250 copies / mL. A negative result does not preclude SARS-CoV-2 infection and should not be used as the sole basis for treatment or other patient management decisions.  A negative result may occur with improper specimen collection / handling, submission of specimen other than nasopharyngeal swab, presence of viral mutation(s) within the areas targeted by this assay, and inadequate number of viral copies (<250 copies / mL). A negative result must be combined with clinical observations, patient history, and epidemiological information.  Fact Sheet for Patients:   StrictlyIdeas.no  Fact Sheet for Healthcare Providers: BankingDealers.co.za  This test is not yet approved or  cleared by the [place] FDA and has been authorized for detection and/or diagnosis of SARS-CoV-2 by FDA under an Emergency Use Authorization (EUA).  This EUA will remain in effect (meaning this test can be used) for the duration of the COVID-19 declaration under Section 564(b)(1) of the Act, 21 U.S.C. section 360bbb-3(b)(1), unless the authorization is terminated or revoked sooner.  Performed at  [facility], [address], [place], [place] [address]   Blood Culture (routine x 2)     Status: None (Preliminary result)   Collection Time: 10/24/19  1:57 PM   Specimen: Right Antecubital; Blood  Result Value Ref Range Status   Specimen Description   Final    RIGHT ANTECUBITAL BOTTLES DRAWN AEROBIC AND ANAEROBIC   Special Requests Blood Culture adequate volume  Final   Culture   Final    NO GROWTH 2 DAYS Performed at [facility], [address] [place], [place] [address]    Report Status PENDING  Incomplete  Blood Culture (routine x 2)     Status: None (Preliminary result)   Collection Time: 10/24/19  1:57 PM   Specimen: Right Antecubital; Blood  Result Value Ref Range Status   Specimen Description   Final    RIGHT ANTECUBITAL BOTTLES DRAWN AEROBIC AND ANAEROBIC   Special Requests Blood Culture adequate volume  Final   Culture   Final    NO GROWTH 2 DAYS Performed at [facility], [address] [place], [place] [address]    Report Status PENDING  Incomplete  MRSA PCR Screening     Status: Abnormal   Collection Time: 10/25/19 11:18 AM   Specimen: Nasal Mucosa; Nasopharyngeal  Result Value Ref Range Status   MRSA by PCR POSITIVE (A) NEGATIVE Final    Comment:        The GeneXpert MRSA Assay (FDA approved for NASAL specimens only), is one component of a comprehensive MRSA colonization surveillance program. It is not intended to diagnose MRSA infection nor to guide or monitor treatment for MRSA infections. RESULT CALLED TO, READ BACK BY AND VERIFIED WITH: MURPHY,[email or web address]  BY MATTHEWS, B 9.9.2021 Performed at [facility], [address], [place], [place] [address]     Radiology Reports CT HEAD WO CONTRAST  Result Date: 10/25/2019 CLINICAL DATA:  64 year old female with hypoxia and altered mental status. EXAM: CT HEAD WITHOUT CONTRAST TECHNIQUE: Contiguous axial images were obtained from the base of the skull through the vertex without intravenous contrast. COMPARISON:   Head CT 09/28/2017. FINDINGS: Brain: No midline shift, ventriculomegaly, mass effect, evidence of mass lesion, intracranial hemorrhage or evidence of cortically based acute infarction. Scattered, patchy bilateral cerebral white matter hypodensity appears not significantly changed. Otherwise gray-white matter differentiation remains within normal limits. Vascular: Calcified atherosclerosis at the skull base. Skull: No acute osseous abnormality identified. Sinuses/Orbits: Visualized paranasal sinuses and mastoids are stable and well pneumatized. Other: Visualized orbits and scalp soft tissues are within normal limits. IMPRESSION: 1. No acute intracranial abnormality by CT. 2. Patchy chronic white matter changes, most commonly due to chronic small vessel disease. Electronically Signed   By: Genevie Ann M.D.   On: 10/25/2019 16:10   CT Angio Chest PE W and/or Wo Contrast  Result Date: 10/24/2019 CLINICAL DATA:  Chest pain. EXAM: CT ANGIOGRAPHY CHEST WITH CONTRAST TECHNIQUE: Multidetector CT imaging of the chest was performed using the standard protocol during bolus administration of intravenous contrast. Multiplanar CT image reconstructions and MIPs were obtained to evaluate the vascular anatomy. CONTRAST:  139mL OMNIPAQUE IOHEXOL 350 MG/ML SOLN COMPARISON:  February 27, 2019. FINDINGS: Cardiovascular: Atherosclerosis of thoracic aorta is noted without aneurysm or dissection. Status post coronary bypass graft. The patient is status post right upper lobectomy. There is the interval development of thrombus within the most distal portion of the stump of the right upper lobe branch of the right pulmonary artery. No definite embolus is noted. Mild cardiomegaly is noted. No pericardial effusion is noted. Mediastinum/Nodes: Moderate size sliding-type hiatal hernia is noted. Thyroid gland is unremarkable. No adenopathy is noted. Lungs/Pleura: No pneumothorax is noted. Left lung is clear. Moderate size loculated right pleural  effusion is noted with associated subsegmental atelectasis of the right lower lobe. Status post right upper lobectomy as described above. Upper Abdomen: No acute abnormality. Musculoskeletal: No chest wall abnormality. No acute or significant osseous findings. Review of the MIP images confirms the above findings. IMPRESSION: 1. Status post right upper lobectomy. Interval development of thrombus within the most distal portion of the residual stump of the right upper lobe branch of the right pulmonary artery. No definite pulmonary embolus is noted. 2. Moderate size  loculated right pleural effusion is noted with associated subsegmental atelectasis of the right lower lobe. 3. Moderate size sliding-type hiatal hernia. 4. Aortic atherosclerosis. Aortic Atherosclerosis (ICD10-I70.0). Electronically Signed   By: Marijo Conception M.D.   On: 10/24/2019 15:47   [place] CHEST (PLEURAL EFFUSION)  Result Date: 10/25/2019 CLINICAL DATA:  RIGHT pleural effusion by CT, history of COPD, GERD, RIGHT upper lobe lung cancer post upper lobectomy EXAM: CHEST ULTRASOUND COMPARISON:  CT angio chest 10/24/2019 FINDINGS: Small pleural effusion identified at the RIGHT lung base. Effusion is partially loculated. Atelectatic RIGHT lower lobe is adherent to the posterior chest wall and prevents safe access of the inferior thoracic cavity for thoracentesis. This appearance was seen on the prior CT but appears fixed with change in position on ultrasound. IMPRESSION: Small loculated RIGHT pleural effusion, unable to safely access for thoracentesis. Electronically Signed   By: Lavonia Dana M.D.   On: 10/25/2019 11:00   DG Chest Portable 1 View  Result Date: 10/24/2019 CLINICAL DATA:  Shortness of breath with fever and chills EXAM: PORTABLE CHEST 1 VIEW COMPARISON:  May 25, 2019 FINDINGS: There is a small right pleural effusion. There is postoperative change with scarring on the right. There is no edema or airspace opacity. Heart is upper normal in  size with pulmonary vascularity normal. Patient is status post internal mammary bypass grafting. No adenopathy. No bone lesions. IMPRESSION: Small right pleural effusion. Scarring with volume loss on the right. No frank edema or airspace opacity. Stable cardiac silhouette. Postoperative changes noted. Electronically Signed   By: Lowella Grip III M.D.   On: 10/24/2019 13:32     CBC Recent Labs  Lab 10/24/19 1316 10/25/19 0352 10/26/19 0534 10/27/19 0613  WBC 12.4* 12.3* 18.6* 9.9  HGB 12.1 11.7* 10.4* 10.7*  HCT 39.7 39.0 34.8* 36.2  PLT 224 215 173 144*  MCV 89.4 91.3 91.6 92.3  MCH 27.3 27.4 27.4 27.3  MCHC 30.5 30.0 29.9* 29.6*  RDW 15.3 15.3 15.2 15.6*  LYMPHSABS 1.5  --   --   --   MONOABS 1.5*  --   --   --   EOSABS 0.0  --   --   --   BASOSABS 0.1  --   --   --     Chemistries  Recent Labs  Lab 10/24/19 1316 10/25/19 0352 10/26/19 0534 10/26/19 1115 10/27/19 0613  NA 127* 129*  --  130* 132*  K 5.8*  5.5* 5.7*  --  5.4* 5.6*  CL 92* 92*  --  93* 93*  CO2 27 27  --  28 29  GLUCOSE 158* 154*  --  147* 154*  BUN 46* 33*  --  30* 27*  CREATININE 1.17* 0.70 0.69 0.58 0.51  CALCIUM 8.2* 8.4*  --  7.9* 8.2*  AST 46* 33  --  25 23  ALT 145* 123*  --  83* 68*  ALKPHOS 114 108  --  86 78  BILITOT 1.1 0.8  --  0.8 1.0   ------------------------------------------------------------------------------------------------------------------ Recent Labs    10/24/19 1316  TRIG 151*    Lab Results  Component Value Date   HGBA1C 6.2 (H) 12/22/2017   ------------------------------------------------------------------------------------------------------------------ No results for input(s): TSH, T4TOTAL, T3FREE, THYROIDAB in the last 72 hours.  Invalid input(s): FREET3 ------------------------------------------------------------------------------------------------------------------ Recent Labs    10/24/19 1316  FERRITIN 13    Coagulation profile No results for  input(s): INR, PROTIME in the last 168 hours.  Recent Labs  10/24/19 1316  DDIMER 5.27*    Cardiac Enzymes No results for input(s): CKMB, TROPONINI, MYOGLOBIN in the last 168 hours.  Invalid input(s): CK ------------------------------------------------------------------------------------------------------------------    Component Value Date/Time   BNP 905.0 (H) 10/25/2019 2133     [name] M.D on 10/27/2019 at 8:04 AM  Go to [email or web address] - for contact info  [facility] - Office  [phone]

## 2019-10-27 NOTE — Progress Notes (Signed)
[facility] Admitting given a message that patient has arrived to [facility] room 7

## 2019-10-27 NOTE — Progress Notes (Signed)
Vanco. IVPB restarted.

## 2019-10-27 NOTE — Progress Notes (Signed)
  Echocardiogram 2D Echocardiogram limited has been performed.  Darlina Sicilian M 10/27/2019, 1:42 PM

## 2019-10-27 NOTE — Progress Notes (Signed)
As per report from [place] RN wrist restraint have been removed since early am.MD aware. Continue to monitor closely.

## 2019-10-27 NOTE — Progress Notes (Signed)
ANTICOAGULATION CONSULT NOTE -   Pharmacy Consult for heparin IV Indication: Elevated troponin  No Known Allergies  Patient Measurements: Height: 5\' 5"  (165.1 cm) Weight: 90 kg (198 lb 6.6 oz) IBW/kg (Calculated) : 57 Heparin Dosing Weight: 73 kg  Vital Signs: Temp: 98.3 F (36.8 C) (09/10 2000) Temp Source: Oral (09/10 2000) BP: 156/54 (09/10 2300) Pulse Rate: 92 (09/10 2300)  Labs: Recent Labs    10/24/19 1316 10/24/19 1316 10/24/19 1655 10/25/19 0352 10/25/19 1348 10/26/19 0534 10/26/19 1115 10/26/19 1500 10/26/19 2304  HGB 12.1   < >  --  11.7*  --  10.4*  --   --   --   HCT 39.7  --   --  39.0  --  34.8*  --   --   --   PLT 224  --   --  215  --  173  --   --   --   HEPARINUNFRC  --   --   --  1.10*   < > 0.73*  --  0.71* 0.20*  CREATININE 1.17*   < >  --  0.70  --  0.69 0.58  --   --   TROPONINIHS 79*  --  84*  --   --   --   --   --   --    < > = values in this interval not displayed.    Estimated Creatinine Clearance: 76.7 mL/min (by C-G formula based on SCr of 0.58 mg/dL).   Medical History: Past Medical History:  Diagnosis Date  . Anxiety   . Asthma   . COPD (chronic obstructive pulmonary disease) ([place])   . Coronary artery disease    hx CABG  . GERD (gastroesophageal reflux disease)   . Peripheral vascular disease ([place])   . Pneumonia   . Shortness of breath dyspnea   . Stroke [facility])    "mini stroke" numbness and weakness to left side of the body    Medications:  Medications Prior to Admission  Medication Sig Dispense Refill Last Dose  . albuterol (PROVENTIL) (2.5 MG/3ML) 0.083% nebulizer solution Take 2.5 mg by nebulization every 4 (four) hours as needed.     . ALPRAZolam (XANAX) 0.5 MG tablet Take 0.5 mg by mouth 3 (three) times daily.    10/23/2019 at Unknown time  . Ascorbic Acid (VITAMIN C GUMMIE) 120 MG CHEW Chew 500 mg by mouth.   10/23/2019 at Unknown time  . aspirin 81 MG chewable tablet Chew 81 mg by mouth daily.   10/23/2019 at 0900  .  atorvastatin (LIPITOR) 40 MG tablet Take 1 tablet (40 mg total) daily by mouth. 30 tablet 1 10/23/2019 at Unknown time  . calcium carbonate (OS-CAL) 1250 (500 Ca) MG chewable tablet Chew 1 tablet by mouth daily.   10/23/2019 at Unknown time  . cholecalciferol (VITAMIN D3) 25 MCG (1000 UNIT) tablet Take 1,000 Units by mouth daily.   10/23/2019 at Unknown time  . diphenhydramine-acetaminophen (TYLENOL PM) 25-500 MG TABS tablet Take 2-3 tablets by mouth at bedtime.   10/23/2019 at Unknown time  . isosorbide mononitrate (IMDUR) 30 MG 24 hr tablet Take 0.5 tablets (15 mg total) by mouth daily. 45 tablet 3 10/23/2019 at Unknown time  . metoprolol succinate (TOPROL-XL) 50 MG 24 hr tablet Take 50 mg by mouth daily. Take with or immediately following a meal.   10/23/2019 at 0900  . Multiple Vitamin (MULTIVITAMIN WITH MINERALS) TABS tablet Take 1 tablet by mouth daily.  10/23/2019 at Unknown time  . pantoprazole (PROTONIX) 40 MG tablet Take 40 mg by mouth daily.   10/23/2019 at Unknown time  . potassium chloride SA (K-DUR,KLOR-CON) 20 MEQ tablet Take 20 mEq by mouth daily.   2 10/23/2019 at Unknown time  . Tiotropium Bromide-Olodaterol (STIOLTO RESPIMAT) 2.5-2.5 MCG/ACT AERS Inhale 2 puffs into the lungs daily. 4 g 4 10/23/2019 at Unknown time  . torsemide (DEMADEX) 20 MG tablet Take 1 tablet (20 mg total) by mouth daily.   10/23/2019 at Unknown time    Assessment: IV heparin for elevated troponins and thrombus on CT (not PE). Hgb 12.1. Ddimer 5.27.  - 9/8 CT: s/p RU lobectomy. Interval development of thrombus within the most distal portion of the residual stump of the right upper lobe branch of the right pulmonary artery. No definite pulmonary embolus is noted.  -Heparin level down to 0.2 on 850 units/hr  Goal of Therapy:  Heparin level 0.3-0.7 units/ml Monitor platelets by anticoagulation protocol: Yes   Plan:  Increase heparin infusion to 950 units/hr  Check anti-Xa level in 6 hours and daily while on  heparin Continue to monitor H&H and platelets  Hildred Laser, PharmD Clinical Pharmacist **Pharmacist phone directory can now be found on [email or web address] ([email or web address]).  Listed under [place].

## 2019-10-27 NOTE — Progress Notes (Signed)
Became agitated refusing nebulizer, pulled out bilateral mittens. Haldol iv given by another RN and started to hit any body that gets near her. MD aware and gave order. Continue to monitor.

## 2019-10-27 NOTE — Progress Notes (Signed)
Vancomycin and ns stopped, iv site swollen, IV team consulted.

## 2019-10-27 NOTE — Progress Notes (Signed)
Still restless despite haldol given. Attempted to get out of bed, trying to pull out iv line an o2 pulse ox probe. Ativan 1 mg iv given continue to monitor.

## 2019-10-27 NOTE — Progress Notes (Signed)
Pt is actively trying to get out of restraints, she is trying to get out of bed, taking off leads. She pulled the tubing apart in one IV. Pt is getting xanax po and haldol IV with only brief periods of peacefulness. Plan earlier in the shift had the [facility] wanting to transfer the patient to [facility], however in my professional judgement the transfer would have been inapporpriate. AC notified, and she states that [facility] has no telemetry boxes available. Pt remains here in ICU/SD unit at this time.

## 2019-10-27 NOTE — Progress Notes (Signed)
ANTICOAGULATION CONSULT NOTE   Pharmacy Consult for heparin IV Indication: Elevated troponin  No Known Allergies  Patient Measurements: Height: 5\' 5"  (165.1 cm) Weight: 89.2 kg (196 lb 10.4 oz) IBW/kg (Calculated) : 57 Heparin Dosing Weight: 73 kg  Vital Signs: Temp: 98.3 F (36.8 C) (09/11 0909) Temp Source: Oral (09/11 0909) BP: 140/79 (09/11 0953) Pulse Rate: 90 (09/11 0953)  Labs: Recent Labs    10/24/19 1316 10/24/19 1316 10/24/19 1655 10/25/19 0352 10/25/19 1348 10/26/19 0534 10/26/19 1115 10/26/19 1500 10/26/19 2304 10/27/19 0613  HGB 12.1   < >  --  11.7*  --  10.4*  --   --   --  10.7*  HCT 39.7   < >  --  39.0  --  34.8*  --   --   --  36.2  PLT 224   < >  --  215  --  173  --   --   --  144*  HEPARINUNFRC  --   --   --  1.10*   < > 0.73*  --  0.71* 0.20*  --   CREATININE 1.17*   < >  --  0.70  --  0.69 0.58  --   --  0.51  TROPONINIHS 79*  --  84*  --   --   --   --   --   --   --    < > = values in this interval not displayed.    Estimated Creatinine Clearance: 76.3 mL/min (by C-G formula based on SCr of 0.51 mg/dL).   Medical History: Past Medical History:  Diagnosis Date  . Anxiety   . Asthma   . COPD (chronic obstructive pulmonary disease) ([place])   . Coronary artery disease    hx CABG  . GERD (gastroesophageal reflux disease)   . Peripheral vascular disease ([place])   . Pneumonia   . Shortness of breath dyspnea   . Stroke [facility])    "mini stroke" numbness and weakness to left side of the body    Medications:  Medications Prior to Admission  Medication Sig Dispense Refill Last Dose  . albuterol (PROVENTIL) (2.5 MG/3ML) 0.083% nebulizer solution Take 2.5 mg by nebulization every 4 (four) hours as needed.     . ALPRAZolam (XANAX) 0.5 MG tablet Take 0.5 mg by mouth 3 (three) times daily.    10/23/2019 at Unknown time  . Ascorbic Acid (VITAMIN C GUMMIE) 120 MG CHEW Chew 500 mg by mouth.   10/23/2019 at Unknown time  . aspirin 81 MG chewable tablet  Chew 81 mg by mouth daily.   10/23/2019 at 0900  . atorvastatin (LIPITOR) 40 MG tablet Take 1 tablet (40 mg total) daily by mouth. 30 tablet 1 10/23/2019 at Unknown time  . calcium carbonate (OS-CAL) 1250 (500 Ca) MG chewable tablet Chew 1 tablet by mouth daily.   10/23/2019 at Unknown time  . cholecalciferol (VITAMIN D3) 25 MCG (1000 UNIT) tablet Take 1,000 Units by mouth daily.   10/23/2019 at Unknown time  . diphenhydramine-acetaminophen (TYLENOL PM) 25-500 MG TABS tablet Take 2-3 tablets by mouth at bedtime.   10/23/2019 at Unknown time  . isosorbide mononitrate (IMDUR) 30 MG 24 hr tablet Take 0.5 tablets (15 mg total) by mouth daily. 45 tablet 3 10/23/2019 at Unknown time  . metoprolol succinate (TOPROL-XL) 50 MG 24 hr tablet Take 50 mg by mouth daily. Take with or immediately following a meal.   10/23/2019 at 0900  . Multiple  Vitamin (MULTIVITAMIN WITH MINERALS) TABS tablet Take 1 tablet by mouth daily.   10/23/2019 at Unknown time  . pantoprazole (PROTONIX) 40 MG tablet Take 40 mg by mouth daily.   10/23/2019 at Unknown time  . potassium chloride SA (K-DUR,KLOR-CON) 20 MEQ tablet Take 20 mEq by mouth daily.   2 10/23/2019 at Unknown time  . Tiotropium Bromide-Olodaterol (STIOLTO RESPIMAT) 2.5-2.5 MCG/ACT AERS Inhale 2 puffs into the lungs daily. 4 g 4 10/23/2019 at Unknown time  . torsemide (DEMADEX) 20 MG tablet Take 1 tablet (20 mg total) by mouth daily.   10/23/2019 at Unknown time    Assessment: IV heparin for elevated troponins and thrombus on CT (not PE). Hgb 12.1. Ddimer 5.27.  - 9/8 CT: s/p RU lobectomy. Interval development of thrombus within the most distal portion of the residual stump of the right upper lobe branch of the right pulmonary artery. No definite pulmonary embolus is noted.  -Heparin level at goal this morning at 0.68 on 950 units/hr.  Goal of Therapy:  Heparin level 0.3-0.7 units/ml Monitor platelets by anticoagulation protocol: Yes   Plan:  Continue heparin infusion at 950  units/hr  Check anti-Xa level daily while on heparin Continue to monitor H&H and platelets  Erin Hearing PharmD., BCPS Clinical Pharmacist 10/27/2019 9:58 AM

## 2019-10-27 NOTE — Progress Notes (Signed)
RT note-Attempted to give patient her breathing treatment, she was very agitated and pulling mittens off and refused her treatment, RN notified, will re assess.

## 2019-10-28 ENCOUNTER — Inpatient Hospital Stay: Payer: Self-pay

## 2019-10-28 ENCOUNTER — Encounter (HOSPITAL_COMMUNITY): Payer: Self-pay | Admitting: Internal Medicine

## 2019-10-28 DIAGNOSIS — G9341 Metabolic encephalopathy: Secondary | ICD-10-CM

## 2019-10-28 DIAGNOSIS — J441 Chronic obstructive pulmonary disease with (acute) exacerbation: Secondary | ICD-10-CM

## 2019-10-28 LAB — RETICULOCYTES
Immature Retic Fract: 17.6 % — ABNORMAL HIGH (ref 2.3–15.9)
RBC.: 3.57 MIL/uL — ABNORMAL LOW (ref 3.87–5.11)
Retic Count, Absolute: 86.4 10*3/uL (ref 19.0–186.0)
Retic Ct Pct: 2.4 % (ref 0.4–3.1)

## 2019-10-28 LAB — BLOOD GAS, ARTERIAL
Acid-Base Excess: 7.6 mmol/L — ABNORMAL HIGH (ref 0.0–2.0)
Bicarbonate: 33.3 mmol/L — ABNORMAL HIGH (ref 20.0–28.0)
Drawn by: 275531
FIO2: 32
O2 Saturation: 97.3 %
Patient temperature: 37
pCO2 arterial: 63.2 mmHg — ABNORMAL HIGH (ref 32.0–48.0)
pH, Arterial: 7.341 — ABNORMAL LOW (ref 7.350–7.450)
pO2, Arterial: 86.4 mmHg (ref 83.0–108.0)

## 2019-10-28 LAB — COMPREHENSIVE METABOLIC PANEL
ALT: 60 U/L — ABNORMAL HIGH (ref 0–44)
AST: 22 U/L (ref 15–41)
Albumin: 3.3 g/dL — ABNORMAL LOW (ref 3.5–5.0)
Alkaline Phosphatase: 71 U/L (ref 38–126)
Anion gap: 6 (ref 5–15)
BUN: 20 mg/dL (ref 8–23)
CO2: 33 mmol/L — ABNORMAL HIGH (ref 22–32)
Calcium: 8.2 mg/dL — ABNORMAL LOW (ref 8.9–10.3)
Chloride: 95 mmol/L — ABNORMAL LOW (ref 98–111)
Creatinine, Ser: 0.63 mg/dL (ref 0.44–1.00)
GFR calc Af Amer: >60 mL/min (ref >60)
GFR calc non Af Amer: >60 mL/min (ref >60)
Glucose, Bld: 111 mg/dL — ABNORMAL HIGH (ref 70–99)
Potassium: 5.1 mmol/L (ref 3.5–5.1)
Sodium: 134 mmol/L — ABNORMAL LOW (ref 135–145)
Total Bilirubin: 1 mg/dL (ref 0.3–1.2)
Total Protein: 6.1 g/dL — ABNORMAL LOW (ref 6.5–8.1)

## 2019-10-28 LAB — IRON AND TIBC
Iron: 21 ug/dL — ABNORMAL LOW (ref 28–170)
Saturation Ratios: 5 % — ABNORMAL LOW (ref 10.4–31.8)
TIBC: 426 ug/dL (ref 250–450)
UIBC: 405 ug/dL

## 2019-10-28 LAB — MAGNESIUM: Magnesium: 2.9 mg/dL — ABNORMAL HIGH (ref 1.7–2.4)

## 2019-10-28 LAB — CBC
HCT: 31.9 % — ABNORMAL LOW (ref 36.0–46.0)
Hemoglobin: 9.4 g/dL — ABNORMAL LOW (ref 12.0–15.0)
MCH: 26.5 pg (ref 26.0–34.0)
MCHC: 29.5 g/dL — ABNORMAL LOW (ref 30.0–36.0)
MCV: 89.9 fL (ref 80.0–100.0)
Platelets: 146 10*3/uL — ABNORMAL LOW (ref 150–400)
RBC: 3.55 MIL/uL — ABNORMAL LOW (ref 3.87–5.11)
RDW: 15.5 % (ref 11.5–15.5)
WBC: 6.3 10*3/uL (ref 4.0–10.5)
nRBC: 1.6 % — ABNORMAL HIGH (ref 0.0–0.2)

## 2019-10-28 LAB — GLUCOSE, CAPILLARY
Glucose-Capillary: 87 mg/dL (ref 70–99)
Glucose-Capillary: 122 mg/dL — ABNORMAL HIGH (ref 70–99)
Glucose-Capillary: 95 mg/dL (ref 70–99)
Glucose-Capillary: 112 mg/dL — ABNORMAL HIGH (ref 70–99)

## 2019-10-28 LAB — AMMONIA: Ammonia: 25 umol/L (ref 9–35)

## 2019-10-28 LAB — FOLATE: Folate: 21.8 ng/mL (ref >5.9)

## 2019-10-28 LAB — HEPARIN LEVEL (UNFRACTIONATED): Heparin Unfractionated: 0.55 IU/mL (ref 0.30–0.70)

## 2019-10-28 LAB — FERRITIN: Ferritin: 24 ng/mL (ref 11–307)

## 2019-10-28 LAB — VITAMIN B12: Vitamin B-12: 1023 pg/mL — ABNORMAL HIGH (ref 180–914)

## 2019-10-28 LAB — TSH: TSH: 1.767 u[IU]/mL (ref 0.350–4.500)

## 2019-10-28 MED ORDER — DEXMEDETOMIDINE HCL IN NACL 400 MCG/100ML IV SOLN
0.4000 ug/kg/h | INTRAVENOUS | Status: DC
  Administered 2019-10-28: 0.2 ug/kg/h via INTRAVENOUS
  Administered 2019-10-29: 0.3 ug/kg/h via INTRAVENOUS
  Administered 2019-10-29 – 2019-10-30 (×6): 0.7 ug/kg/h via INTRAVENOUS
  Administered 2019-10-31: 0.6 ug/kg/h via INTRAVENOUS
  Administered 2019-10-31: 1 ug/kg/h via INTRAVENOUS
  Administered 2019-11-01: 0.5 ug/kg/h via INTRAVENOUS
  Filled 2019-10-28: qty 100
  Filled 2019-10-28: qty 200
  Filled 2019-10-28 (×9): qty 100

## 2019-10-28 MED ORDER — SODIUM CHLORIDE 0.9% FLUSH: 10.0000 mL | INTRAVENOUS | Status: DC | PRN

## 2019-10-28 MED ORDER — HALOPERIDOL LACTATE 5 MG/ML IJ SOLN: 5.0000 mg | Freq: Four times a day (QID) | INTRAMUSCULAR | Status: DC | PRN

## 2019-10-28 MED ORDER — LORAZEPAM 2 MG/ML IJ SOLN
1.0000 mg | Freq: Four times a day (QID) | INTRAMUSCULAR | Status: DC | PRN
  Administered 2019-10-28: 1 mg via INTRAVENOUS
  Filled 2019-10-28: qty 1

## 2019-10-28 MED ORDER — THIAMINE HCL 100 MG PO TABS
100.0000 mg | ORAL_TABLET | Freq: Every day | ORAL | Status: DC
  Administered 2019-10-28: 100 mg via ORAL
  Filled 2019-10-28: qty 1

## 2019-10-28 MED ORDER — MORPHINE SULFATE (PF) 2 MG/ML IV SOLN: 1.0000 mg | INTRAVENOUS | Status: DC | PRN

## 2019-10-28 MED ORDER — SODIUM CHLORIDE 0.9% FLUSH
10.0000 mL | Freq: Two times a day (BID) | INTRAVENOUS | Status: DC
  Administered 2019-10-28 – 2019-11-03 (×10): 10 mL

## 2019-10-28 MED ORDER — FOLIC ACID 1 MG PO TABS
1.0000 mg | ORAL_TABLET | Freq: Every day | ORAL | Status: DC
  Administered 2019-10-28: 1 mg via ORAL
  Filled 2019-10-28: qty 1

## 2019-10-28 MED ORDER — METHYLPREDNISOLONE SODIUM SUCC 40 MG IJ SOLR
20.0000 mg | Freq: Two times a day (BID) | INTRAMUSCULAR | Status: DC
  Administered 2019-10-29 – 2019-11-01 (×7): 20 mg via INTRAVENOUS
  Filled 2019-10-28 (×7): qty 1

## 2019-10-28 NOTE — Progress Notes (Signed)
Telephone conversation with Dr Karleen Hampshire re PICC order, FYI in chart to have IR place PICC lines and hx of extensive Left DVT and right sided vascular surgery.  Order to cancel PICC order.  Dr Karleen Hampshire to follow up.

## 2019-10-28 NOTE — Progress Notes (Signed)
Patient was getting agitated with mitts. She called his brother's name and wanted to see him. Tried to pull out mitts continuously. Tried to distract not to pull mitts, but it didn't work. Patient refused to take pill only ice water. She spit out medications. Provided only ice water, she denied to have any food. Administered prn ativan due to her agitation and preventing pull out the mitts, IV access, O2 [place] and cardiac monitor. HS [employer]

## 2019-10-28 NOTE — Progress Notes (Signed)
     [address]       [place] [address]             [phone]       Confused yesterday Combative Remains in restraints  Resting comfortably now IR eval performed.  Drain placement tomorrow Once mental status improves, will discuss hiatal hernia repair   Emily Rocha

## 2019-10-28 NOTE — Progress Notes (Signed)
RT note-Patient is very confused, agitated and combative, Bipap removed at this time, remains on 3L min [place].

## 2019-10-28 NOTE — Progress Notes (Signed)
10/27/2019 @1900 : Patient pulled out one of the IV. Patient bleeding from the IV site. Pressure held for couple of minutes and wrapped with kerlex.  Patient still agitated and trying to pull off the mittens.  @2030 : This RN went to the patient's room. The kerlex was drenched with blood. Held pressure and changed the dressing. Patient agitated and trying to get out of bed. Breathing treatment given by this RN. Called the pharmacy and informed about the  Bleeding. No more bleeding seen later.  @[id number]  given to the patient. Patient resting well.   @2200 : RT put the patient on BiPAP.

## 2019-10-28 NOTE — Progress Notes (Signed)
ANTICOAGULATION CONSULT NOTE   Pharmacy Consult for heparin IV Indication: Rule out ACS  No Known Allergies  Patient Measurements: Height: 5\' 5"  (165.1 cm) Weight: 89.2 kg (196 lb 10.4 oz) IBW/kg (Calculated) : 57 Heparin Dosing Weight: 73 kg  Vital Signs: Temp: 97.6 F (36.4 C) (09/12 0839) Temp Source: Oral (09/12 0839) BP: 124/69 (09/12 0839) Pulse Rate: 92 (09/12 0839)  Labs: Recent Labs    10/26/19 0534 10/26/19 0534 10/26/19 1115 10/26/19 1500 10/26/19 2304 10/27/19 0613 10/27/19 1045 10/28/19 0232  HGB 10.4*   < >  --   --   --  10.7*  --  9.4*  HCT 34.8*  --   --   --   --  36.2  --  31.9*  PLT 173  --   --   --   --  144*  --  146*  HEPARINUNFRC 0.73*   < >  --  0.71* 0.20*  --  0.64  --   CREATININE 0.69   < > 0.58  --   --  0.51  --  0.63   < > = values in this interval not displayed.    Estimated Creatinine Clearance: 76.3 mL/min (by C-G formula based on SCr of 0.63 mg/dL).   Medical History: Past Medical History:  Diagnosis Date  . Anxiety   . Asthma   . COPD (chronic obstructive pulmonary disease) ([place])   . Coronary artery disease    hx CABG  . GERD (gastroesophageal reflux disease)   . Peripheral vascular disease ([place])   . Pneumonia   . Shortness of breath dyspnea   . Stroke [facility])    "mini stroke" numbness and weakness to left side of the body    Assessment: IV heparin for elevated troponins and thrombus on CT (not PE). Hgb 12.1. Ddimer 5.27 on admit.  - 9/8 CT: s/p RU lobectomy. Interval development of thrombus within the most distal portion of the residual stump of the right upper lobe branch of the right pulmonary artery. No definite pulmonary embolus is noted.  Heparin level at goal. No issues noted.   Goal of Therapy:  Heparin level 0.3-0.7 units/ml Monitor platelets by anticoagulation protocol: Yes   Plan:  Continue heparin infusion at 950 units/hr  Check anti-Xa level daily while on heparin Continue to monitor H&H and  platelets  Erin Hearing PharmD., BCPS Clinical Pharmacist 10/28/2019 9:14 AM

## 2019-10-28 NOTE — Consult Note (Signed)
NAME:  Emily Rocha, MRN:  [id number], DOB:  [birth year], LOS: 4 ADMISSION DATE:  10/24/2019, CONSULTATION DATE:  9/12 REFERRING MD:  Karleen Hampshire, CHIEF COMPLAINT:  Delirium    Brief History   64 year old female with history of COPD, coronary artery disease, adenocarcinoma of the right lung requiring VATS and right upper lobe lobectomy, also newly identified hiatal hernia admitted initially with pneumonia and what appears to be complicated right pleural effusion at any [facility] subsequently transferred to [facility] on 9/11 for cardiothoracic evaluation and interventional radiology drainage.  Critical care asked to evaluate the patient on 9/12 for refractory delirium, and concern about progressive hypercarbic respiratory failure.  History of present illness   This is a 64 year old female with history as mentioned below.  Admitted on 9/8 with chief complaint of delirium, and worsening respiratory distress found to have large loculated right-sided pleural effusion, she was initially admitted at [facility].  There was attempt to drain this unsuccessfully, she was therefore transferred to [facility] for thoracic surgery consultation and CT-guided chest tube drainage.  On CT evaluation with CT angiogram was also found to have interval development of thrombus in the most distal portion of the right upper lobe branch of the right pulmonary artery, was therefore started on anticoagulation also on imaging found to have large hiatal hernia.SinceShe was transferred to [facility] on 9/11, arriving at [facility] she has had worsening and progressive delirium, requiring frequent doses of Ativan, increased doses of Haldol, and direct one-on-one safety sitter as she has continued to pull out tubes and lines.  Additional evaluation has included arterial blood gas most recently showing pH of 7.34, PCO2 of 63, PO2 of 86, bicarbonate of 33.  Ammonia level 25, TSH level normal.  She has been seen by both  thoracic surgery and interventional cardiology.  Plan is for CT-guided drainage, stopping heparin drip in anticipation of this.  Plan is to place this on 9/13.  Because of worsening delirium critical care was asked to evaluate.  Past Medical History   COPD, coronary artery disease with prior CABG 2018, adenocarcinoma of the lung status post VATS with right upper lobectomy, moderate to severe aortic stenosis  Significant Hospital Events   9/9 admitted to [facility] antibiotic started, seen in consultation by [place] we recommended thoracic surgery evaluation 9/10: Developing worsening delirium felt secondary to infection, as well as hyponatremia.  Administered repeated doses of Ativan and Haldol 9/12, continues to have refractory delirium in spite of repeated doses of Ativan and Haldol.  Not tolerating BiPAP.  Pulling out IVs.  Critical care asked to see   Consults:  Interventional radiology Cardiothoracic surgery   Procedures:    Significant Diagnostic Tests:  CT chest 9/8: This demonstrated prior postoperative changes of the right lobe upper lobectomy, new interval development of thrombus within the most distal portion of right upper lobe pulmonary artery branch feeding to residual stump.  Moderate sized loculated right effusion with subsegmental atelectasis and what appears to be some degree of loculation moderate sized sliding-type hiatal hernia and aortic arteriosclerosis. 9/9: CT of head negative for acute findings Micro Data:   9/8 blood culture x2>>> MRSA PCR positive Coronavirus/SARS: Negative Antimicrobials:  Unasyn 9/8?>>> Azithromycin 9/8>>> Vancomycin 9/9>>>   Interim history/subjective:  Very confused.  Has bedside sitter frequently holding her hands so that she does not pull out tubing  Objective   Blood pressure (Abnormal) 103/58, pulse 100, temperature 98.8 F (37.1 C), temperature source  Axillary, resp. rate (Abnormal) 26, height 5\' 5"  (1.651 m), weight  89.2 kg, SpO2 93 %.    FiO2 (%):  [40 %] 40 %   Intake/Output Summary (Last 24 hours) at 10/28/2019 1620 Last data filed at 10/28/2019 1200 Gross per 24 hour  Intake 540.7 ml  Output 700 ml  Net -159.3 ml   Filed Weights   10/26/19 0500 10/27/19 0200 10/28/19 0446  Weight: 90 kg 89.2 kg 89.2 kg    Examination: General: This is an acutely ill confused 64 year old female.  Looks much older than stated age, is confused and uncomfortable HENT: Mucous membranes dry phonation hoarse neck veins flat no JVD sclera nonicteric Lungs: Scattered rhonchi currently 3 L no current accessory use Cardiovascular: Regular rate and rhythm with loud systolic murmur consistent with aortic stenosis Abdomen: Soft nontender Extremities: Edematous, multiple areas of ecchymosis pulses brisk Neuro: Awake confused very disoriented picking and pulling at tubing GU: Clear yellow  Resolved Hospital Problem list   No  Assessment & Plan:  Pneumonia with complicated right pleural effusion. In setting of hiatal hernia 1 would question recurrent aspiration as possible etiology Plan Can discontinue azithromycin today Continue Unasyn day #5 and vancomycin day #4 Needs drainage of right pleural effusion, given loculations  also will almost certainly need TPA  and DNase+ If if agitation not better controlled we may need to intubate her in order to proceed with her chest drainage  Agitated delirium/acute metabolic encephalopathy.  This is at least somewhat complicated by hypercarbia, but also likely exacerbated by ongoing sepsis, also cannot exclude sensitivity to current medication regimen such as benzodiazepines, initially hyponatremia may have been playing a role Plan Transfer to the intensive care Add thiamine and folate Initiate Precedex infusion Treat pain Treat infection Decrease steroids, could be a contributing factor If intubated would also consider valproic acid  Acute hypercarbic respiratory failure,  PCO2 in the 60s.  Has underlying history of COPD, not convinced she is actually in acute exacerbation, suspect sedating medications, underlying lung disease, and new pneumonia/complicated right pleural effusion are the contributing factors to this Plan Scheduled bronchodilators Decrease Solu-Medrol to 20 a day Antibiotics as mentioned above Retrial BiPAP once Precedex started High risk for intubation  Acute pulmonary thrombus.  Incidentally found in the right pulmonary artery leading to old surgical stump Plan Continuing systemic anticoagulation Note this will need to be held prior to drainage of chest We will likely need at least 3 to 6 months of systemic anticoagulation  Severe aortic stenosis, with chronic diastolic heart failure grade 1 Plan Telemetry Careful with volume status Avoid hypovolemia Avoid hypertension  Fluid and electrolyte imbalance: Hyponatremia, improved with saline.   Plan Continue gentle hydration A.m. chemistry  History of coronary artery disease Plan Continuing Lopressor, aspirin, and Imdur as able  Remote history of adenocarcinoma of the lung status post right upper lobe lobectomy 2018 Plan Outpatient follow-up    Best practice:  Diet: N.p.o. Pain/Anxiety/Delirium protocol (if indicated): Initiate Precedex drip VAP protocol (if indicated): Not indicated DVT prophylaxis: IV heparin GI prophylaxis: PPI Glucose control: sliding scale Mobility: Bedrest Code Status: Full code Family Communication: Pending Disposition: Moved to intensive care  Labs   CBC: Recent Labs  Lab 10/24/19 1316 10/25/19 0352 10/26/19 0534 10/27/19 0613 10/28/19 0232  WBC 12.4* 12.3* 18.6* 9.9 6.3  NEUTROABS 9.1*  --   --   --   --   HGB 12.1 11.7* 10.4* 10.7* 9.4*  HCT 39.7 39.0 34.8* 36.2 31.9*  MCV  89.4 91.3 91.6 92.3 89.9  PLT 224 215 173 144* 146*    Basic Metabolic Panel: Recent Labs  Lab 10/24/19 1316 10/24/19 1316 10/25/19 0352 10/26/19 0534  10/26/19 1115 10/27/19 0613 10/28/19 0232 10/28/19 1355  NA 127*  --  129*  --  130* 132* 134*  --   K 5.8*  5.5*  --  5.7*  --  5.4* 5.6* 5.1  --   CL 92*  --  92*  --  93* 93* 95*  --   CO2 27  --  27  --  28 29 33*  --   GLUCOSE 158*  --  154*  --  147* 154* 111*  --   BUN 46*  --  33*  --  30* 27* 20  --   CREATININE 1.17*   < > 0.70 0.69 0.58 0.51 0.63  --   CALCIUM 8.2*  --  8.4*  --  7.9* 8.2* 8.2*  --   MG  --   --   --   --   --   --   --  2.9*   < > = values in this interval not displayed.   GFR: Estimated Creatinine Clearance: 76.3 mL/min (by C-G formula based on SCr of 0.63 mg/dL). Recent Labs  Lab 10/24/19 1316 10/24/19 1316 10/24/19 1357 10/24/19 1655 10/25/19 0352 10/26/19 0534 10/27/19 0613 10/28/19 0232  PROCALCITON 0.15  --   --   --   --   --   --   --   WBC 12.4*   < >  --   --  12.3* 18.6* 9.9 6.3  LATICACIDVEN  --   --  1.2 1.1  --   --   --   --    < > = values in this interval not displayed.    Liver Function Tests: Recent Labs  Lab 10/24/19 1316 10/25/19 0352 10/26/19 1115 10/27/19 0613 10/28/19 0232  AST 46* 33 25 23 22   ALT 145* 123* 83* 68* 60*  ALKPHOS 114 108 86 78 71  BILITOT 1.1 0.8 0.8 1.0 1.0  PROT 6.7 7.2 6.5 6.5 6.1*  ALBUMIN 3.6 3.7 3.4* 3.4* 3.3*   No results for input(s): LIPASE, AMYLASE in the last 168 hours. Recent Labs  Lab 10/28/19 1355  AMMONIA 25    ABG    Component Value Date/Time   PHART 7.341 (L) 10/28/2019 1145   PCO2ART 63.2 (H) 10/28/2019 1145   PO2ART 86.4 10/28/2019 1145   HCO3 33.3 (H) 10/28/2019 1145   TCO2 30 10/26/2018 1241   O2SAT 97.3 10/28/2019 1145     Coagulation Profile: No results for input(s): INR, PROTIME in the last 168 hours.  Cardiac Enzymes: No results for input(s): CKTOTAL, CKMB, CKMBINDEX, TROPONINI in the last 168 hours.  HbA1C: Hgb A1c MFr Bld  Date/Time Value Ref Range Status  12/22/2017 03:35 PM 6.2 (H) 4.8 - 5.6 % Final    Comment:    (NOTE) Pre diabetes:           5.7%-6.4% Diabetes:              >6.4% Glycemic control for   <7.0% adults with diabetes   12/20/2016 03:04 PM 6.0 (H) 4.8 - 5.6 % Final    Comment:    (NOTE) Pre diabetes:          5.7%-6.4% Diabetes:              >6.4% Glycemic control for   <  7.0% adults with diabetes     CBG: Recent Labs  Lab 10/27/19 1726 10/28/19 0608 10/28/19 1228  GLUCAP 127* 87 122*    Review of Systems:   Not able  Past Medical History  She,  has a past medical history of Anxiety, Asthma, COPD (chronic obstructive pulmonary disease) ([place]), Coronary artery disease, GERD (gastroesophageal reflux disease), Peripheral vascular disease ([place]), Pneumonia, Shortness of breath dyspnea, and Stroke ([place]).   Surgical History    Past Surgical History:  Procedure Laterality Date  . AORTIC ARCH ANGIOGRAPHY N/A 11/04/2017   Procedure: AORTIC ARCH ANGIOGRAPHY;  Surgeon: Elam Dutch, MD;  Location: [place] [facility];  Service: Cardiovascular;  Laterality: N/A;  . APPLICATION OF WOUND VAC N/A 01/06/2018   Procedure: APPLICATION OF WOUND VAC;  Surgeon: Ivin Poot, MD;  Location: [place];  Service: Thoracic;  Laterality: N/A;  . APPLICATION OF WOUND VAC N/A 01/13/2018   Procedure: WOUND VAC CHANGE;  Surgeon: Ivin Poot, MD;  Location: [place];  Service: Thoracic;  Laterality: N/A;  . CARDIAC CATHETERIZATION    . CAROTID-SUBCLAVIAN BYPASS GRAFT Right 12/26/2017   Procedure: AORTIC TO RIGHT COMMON CAROTID AND RIGHT SUBCLAVIAN  ARTERY  BYPASS;  Surgeon: Elam Dutch, MD;  Location: [place];  Service: Vascular;  Laterality: Right;  . CHOLECYSTECTOMY    . CLOSURE OF DIAPHRAGM  12/26/2017   Procedure: REPAIR OF DIAPHRAGM;  Surgeon: Ivin Poot, MD;  Location: [place];  Service: Thoracic;;  . COLONOSCOPY N/A 10/03/2014   Procedure: COLONOSCOPY;  Surgeon: Rogene Houston, MD;  [facility];  Service: Endoscopy;  Laterality: N/A;  730  . CORONARY ARTERY BYPASS GRAFT N/A 12/14/2016    Procedure: CORONARY ARTERY BYPASS GRAFTING (CABG) x three , using left internal mammary artery and right leg greater saphenous vein harvested endoscopically;  Surgeon: Ivin Poot, MD;  Location: [place];  Service: Open Heart Surgery;  Laterality: N/A;  . ENDARTERECTOMY Right 12/06/2014   Procedure: ENDARTERECTOMY CAROTid;  Surgeon: Elam Dutch, MD;  Location: [facility];  Service: Vascular;  Laterality: Right;  . ESOPHAGOGASTRODUODENOSCOPY N/A 12/03/2016   Procedure: ESOPHAGOGASTRODUODENOSCOPY (EGD);  Surgeon: Rogene Houston, MD;  [facility];  Service: Endoscopy;  Laterality: N/A;  7:30  . LEFT HEART CATH AND CORONARY ANGIOGRAPHY N/A 12/14/2016   Procedure: LEFT HEART CATH AND CORONARY ANGIOGRAPHY;  Surgeon: Burnell Blanks, MD;  Location: [place] [facility];  Service: Cardiovascular;  Laterality: N/A;  . LOBECTOMY Right 10/26/2018   Procedure: RIGHT UPPER LOBECTOMY;  Surgeon: Lajuana Matte, MD;  Location: [place];  Service: Thoracic;  Laterality: Right;  . PERIPHERAL VASCULAR CATHETERIZATION N/A 12/05/2014   Procedure:  Carotid Arch Angiography;  Surgeon: Conrad Jamesburg, MD;  Location: [place] [facility];  Service: Cardiovascular;  Laterality: N/A;  . STERNAL WOUND DEBRIDEMENT N/A 01/06/2018   Procedure: STERNAL WOUND DEBRIDEMENT;  Surgeon: Ivin Poot, MD;  Location: [place];  Service: Thoracic;  Laterality: N/A;  . STERNOTOMY N/A 12/26/2017   Procedure: REDO STERNOTOMY;  Surgeon: Prescott Gum, Collier Salina, MD;  Location: [place];  Service: Thoracic;  Laterality: N/A;  . TEE WITHOUT CARDIOVERSION N/A 12/14/2016   Procedure: TRANSESOPHAGEAL ECHOCARDIOGRAM (TEE);  Surgeon: Prescott Gum, Collier Salina, MD;  Location: [place];  Service: Open Heart Surgery;  Laterality: N/A;  . VIDEO ASSISTED THORACOSCOPY (VATS)/WEDGE RESECTION Right 10/26/2018   Procedure: VIDEO ASSISTED THORACOSCOPY (VATS)/WEDGE RESECTION;  Surgeon: Lajuana Matte, MD;  Location: [place];  Service:  Thoracic;  Laterality:  Right;  Marland Kitchen VIDEO BRONCHOSCOPY N/A 10/26/2018   Procedure: VIDEO BRONCHOSCOPY;  Surgeon: Lajuana Matte, MD;  Location: [place];  Service: Thoracic;  Laterality: N/A;     Social History   reports that she quit smoking about 4 years ago. She has a 80.00 pack-year smoking history. She has never used smokeless tobacco. She reports that she does not drink alcohol and does not use drugs.   Family History   Her family history includes Cancer in her mother; Diabetes in her mother; Heart attack in her father and mother; Heart disease in her father and mother.   Allergies No Known Allergies   Home Medications  Prior to Admission medications   Medication Sig Start Date End Date Taking? Authorizing Provider  albuterol (PROVENTIL) (2.5 MG/3ML) 0.083% nebulizer solution Take 2.5 mg by nebulization every 4 (four) hours as needed. 03/30/19  Yes [provider]  ALPRAZolam Duanne Moron) 0.5 MG tablet Take 0.5 mg by mouth 3 (three) times daily.    Yes [provider]  Ascorbic Acid (VITAMIN C GUMMIE) 120 MG CHEW Chew 500 mg by mouth.   Yes [provider]  aspirin 81 MG chewable tablet Chew 81 mg by mouth daily.   Yes [provider]  atorvastatin (LIPITOR) 40 MG tablet Take 1 tablet (40 mg total) daily by mouth. 12/27/16  Yes Gold, Wayne E, PA-C  calcium carbonate (OS-CAL) 1250 (500 Ca) MG chewable tablet Chew 1 tablet by mouth daily.   Yes [provider]  cholecalciferol (VITAMIN D3) 25 MCG (1000 UNIT) tablet Take 1,000 Units by mouth daily.   Yes [provider]  diphenhydramine-acetaminophen (TYLENOL PM) 25-500 MG TABS tablet Take 2-3 tablets by mouth at bedtime.   Yes [provider]  isosorbide mononitrate (IMDUR) 30 MG 24 hr tablet Take 0.5 tablets (15 mg total) by mouth daily. 03/21/19  Yes BranchAlphonse Guild, MD  metoprolol succinate (TOPROL-XL) 50 MG 24 hr tablet Take 50 mg by mouth daily. Take with or immediately following a meal.   Yes  [provider]  Multiple Vitamin (MULTIVITAMIN WITH MINERALS) TABS tablet Take 1 tablet by mouth daily. 12/01/18  Yes Lars Pinks M, PA-C  pantoprazole (PROTONIX) 40 MG tablet Take 40 mg by mouth daily.   Yes [provider]  potassium chloride SA (K-DUR,KLOR-CON) 20 MEQ tablet Take 20 mEq by mouth daily.  10/20/17  Yes [provider]  Tiotropium Bromide-Olodaterol (STIOLTO RESPIMAT) 2.5-2.5 MCG/ACT AERS Inhale 2 puffs into the lungs daily. 07/19/19  Yes Mannam, Praveen, MD  torsemide (DEMADEX) 20 MG tablet Take 1 tablet (20 mg total) by mouth daily. 11/01/18  Yes Nani Skillern, PA-C     Critical care time: 33 minutes   Erick Colace ACNP-BC [place] Pager # [phone] OR # [phone] if no answer

## 2019-10-28 NOTE — Progress Notes (Addendum)
PROGRESS NOTE    Emily Rocha  DTO:[id number] DOB: 07/30/1955 DOA: 10/24/2019 PCP: Glenda Chroman, MD    Chief Complaint  Patient presents with  . Shortness of Breath    Brief Narrative: 64 year old lady with prior history of coronary artery disease s/p CABG with left subclavian bypass by Dr. Darcey Nora in 2018, adenocarcinoma of the lung s/p VATS, right upper lobe lobectomy, admitted to 10/24/2019 with acute hypoxic and hypercapnic respiratory failure, and confusion. She was found to have loculated right sided pleural effusion, unsuccessfully tried to aspirate at AP, transferred to [facility] for TCTS evaluation, plan for CT guided drain of the largest effusion. CT surgery also recommends hiatal hernia repair to prevent risk of aspiration in the future.  On arrival to ED pt underwent CT angio of the chest Status post right upper lobectomy. Interval development of thrombus within the most distal portion of the residual stump of the right upper lobe branch of the right pulmonary artery. No definite pulmonary embolus is noted. She was started on IV heparin for anti coagulation.    Assessment & Plan:   Principal Problem:   Acute respiratory failure with hypoxia (HCC) Active Problems:   Rt Sided Pneumonia with loculated parapneumonic effusion   Rt Sided ParaPneumonic Pleural effusion   History of lung cancer- Non-Small cell/S/p Prior VATs   CAD (coronary artery disease)/Prior CABG   Acute respiratory failure (HCC)   Acute respiratory failure with hypoxia and hypercapnia in the setting of right upper lobe lobectomy/( recent VATS for stage 1 non small cell lung cancer by Dr Kipp Brood) and COPD. Probably secondary to a combination para pneumonic pleural effusion, and aspiration pneumonia.  Plan for CT guided aspiration of the loculated right pleural effusion. Appreciate Dr Kipp Brood recommendations.  IV antibiotics for aspiration pneumonia, unasyn and vancomycin ( MRSA PCR is positive).  Blood cultures  have been negative so far.  BIPAP use for hypercapnia and repeat ABG later today to check pCO2.  [place] Oxygen to keep sats greater than 90%, currently requiring upto 3 lit/min.  Bronchodilators and will add solumedrol 40 mg Q12hrs.  Pt remains afebrile and wbc normalized.    Acute Metabolic, toxic encephalopathy probably a combination of hypercarbia and hypoxia, underlying infection ,  Hospital delirium and disorientation from right sided pleuritic chest pain and hyponatremia.   Hyponatremia improved.  Pt still hypercapnic, repeat ABG today. Use BIPAP as needed.  Pain control.  Re orientation and Air cabin crew.  Overnight pt ripped off her IV and has bleeding from the IV site.  Haldol and Ativan prn.   Initial CT head without contrast is negative.  Further work up ordered, including ammonia level, VIT b12, TSH.    CAD s/p CABG   with left subclavian bypass by Dr. Darcey Nora in 2018, Pt does not complain of any chest pain.  Initial troponin elevated. Probably from demand ischemia from acute respiratory illness.  EKG shows NSR with non specific t wave abnormalities.  Resume Aspirin , Metoprolol and Imdur.    Right  upper lobe branch of the right pulmonary artery thrombus:   Started the patient on IV heparin, discussed with PCCM, recommended getting venous duplex and TTE.  TTE ordered but pt was not co operative and we couldn't get images.      Hyperkalemia:  Resolved.    Transaminitis:  Improving.  Statin on hold.    H/o Adenocarcinoma of the lung cancer  Chronic diastolic heart failure:  Last echocardiogram showed LVEF of 60 to 65%,  with grade 1 diastolic dysfunction.  She appears compensated.  Will stop the IV fluids.   DVT prophylaxis: heparin gtt.  Code Status: Full code.  Family Communication: none at bedside this am Disposition:   Status is: Inpatient  Remains inpatient appropriate because:Altered mental status, Ongoing diagnostic testing needed not appropriate  for outpatient work up, IV treatments appropriate due to intensity of illness or inability to take PO and Inpatient level of care appropriate due to severity of illness   Dispo: The patient is from: Home              Anticipated d/c is to: pending.               Anticipated d/c date is: > 3 days              Patient currently is not medically stable to d/c.       Consultants:   Cardiothoracic surgery.    Procedures: echocardiogram.   Antimicrobials:  Antibiotics Given (last 72 hours)    Date/Time Action Medication Dose Rate   10/25/19 1330 New Bag/Given   Ampicillin-Sulbactam (UNASYN) 3 g in sodium chloride 0.9 % 100 mL IVPB 3 g 200 mL/hr   10/25/19 1800 New Bag/Given   vancomycin (VANCOREADY) IVPB 2000 mg/400 mL 2,000 mg 200 mL/hr   10/25/19 1958 New Bag/Given   Ampicillin-Sulbactam (UNASYN) 3 g in sodium chloride 0.9 % 100 mL IVPB 3 g 200 mL/hr   10/26/19 0416 New Bag/Given   Ampicillin-Sulbactam (UNASYN) 3 g in sodium chloride 0.9 % 100 mL IVPB 3 g 200 mL/hr   10/26/19 0524 New Bag/Given   vancomycin (VANCOCIN) IVPB 1000 mg/200 mL premix 1,000 mg 200 mL/hr   10/26/19 0857 Given   azithromycin (ZITHROMAX) tablet 500 mg 500 mg    10/26/19 1310 New Bag/Given   Ampicillin-Sulbactam (UNASYN) 3 g in sodium chloride 0.9 % 100 mL IVPB 3 g 200 mL/hr   10/26/19 1719 New Bag/Given   vancomycin (VANCOCIN) IVPB 1000 mg/200 mL premix 1,000 mg 200 mL/hr   10/26/19 1943 New Bag/Given   Ampicillin-Sulbactam (UNASYN) 3 g in sodium chloride 0.9 % 100 mL IVPB 3 g 200 mL/hr   10/27/19 0338 New Bag/Given   Ampicillin-Sulbactam (UNASYN) 3 g in sodium chloride 0.9 % 100 mL IVPB 3 g 200 mL/hr   10/27/19 0509 New Bag/Given   vancomycin (VANCOCIN) IVPB 1000 mg/200 mL premix 1,000 mg 200 mL/hr   10/27/19 5638 Given   azithromycin (ZITHROMAX) tablet 500 mg 500 mg    10/27/19 1259 New Bag/Given   Ampicillin-Sulbactam (UNASYN) 3 g in sodium chloride 0.9 % 100 mL IVPB 3 g 200 mL/hr   10/27/19 1719  New Bag/Given   vancomycin (VANCOCIN) IVPB 1000 mg/200 mL premix 1,000 mg 200 mL/hr   10/27/19 2142 New Bag/Given   Ampicillin-Sulbactam (UNASYN) 3 g in sodium chloride 0.9 % 100 mL IVPB 3 g 200 mL/hr   10/28/19 0530 New Bag/Given   Ampicillin-Sulbactam (UNASYN) 3 g in sodium chloride 0.9 % 100 mL IVPB 3 g 200 mL/hr   10/28/19 0635 New Bag/Given   vancomycin (VANCOCIN) IVPB 1000 mg/200 mL premix 1,000 mg 200 mL/hr       Subjective: Overnight, pt pulled out IV and was agitated. Unclear how long she was on BIPAP,  She is currently on 3l it of [place] oxygen .  Objective: Vitals:   10/28/19 0446 10/28/19 0734 10/28/19 0735 10/28/19 0839  BP:    124/69  Pulse:  92  Resp:    (!) 24  Temp:    97.6 F (36.4 C)  TempSrc:    Oral  SpO2:  98% 100% 96%  Weight: 89.2 kg     Height:        Intake/Output Summary (Last 24 hours) at 10/28/2019 0945 Last data filed at 10/28/2019 0700 Gross per 24 hour  Intake 516.14 ml  Output 700 ml  Net -183.86 ml   Filed Weights   10/26/19 0500 10/27/19 0200 10/28/19 0446  Weight: 90 kg 89.2 kg 89.2 kg    Examination:  General exam: elderly female, agitated.  Respiratory system: bilateral coarse breath sounds more on the left compared to the right, with tachypnea, and scattered wheezing.  Cardiovascular system: S1 & S2 heard, RRR. No JVD,  No pedal edema. Gastrointestinal system: Abdomen is nondistended, soft and nontender. . Normal bowel sounds heard. Central nervous system: opens eyes to verbal commands, confused, restless, not following commands. Mumbling. Able to move all extremities.  Extremities: no cyanosis.  Skin: No rashes seen.  Psychiatry:cannot be assessed.     Data Reviewed: I have personally reviewed following labs and imaging studies  CBC: Recent Labs  Lab 10/24/19 1316 10/25/19 0352 10/26/19 0534 10/27/19 0613 10/28/19 0232  WBC 12.4* 12.3* 18.6* 9.9 6.3  NEUTROABS 9.1*  --   --   --   --   HGB 12.1 11.7* 10.4* 10.7*  9.4*  HCT 39.7 39.0 34.8* 36.2 31.9*  MCV 89.4 91.3 91.6 92.3 89.9  PLT 224 215 173 144* 146*    Basic Metabolic Panel: Recent Labs  Lab 10/24/19 1316 10/24/19 1316 10/25/19 0352 10/26/19 0534 10/26/19 1115 10/27/19 0613 10/28/19 0232  NA 127*  --  129*  --  130* 132* 134*  K 5.8*  5.5*  --  5.7*  --  5.4* 5.6* 5.1  CL 92*  --  92*  --  93* 93* 95*  CO2 27  --  27  --  28 29 33*  GLUCOSE 158*  --  154*  --  147* 154* 111*  BUN 46*  --  33*  --  30* 27* 20  CREATININE 1.17*   < > 0.70 0.69 0.58 0.51 0.63  CALCIUM 8.2*  --  8.4*  --  7.9* 8.2* 8.2*   < > = values in this interval not displayed.    GFR: Estimated Creatinine Clearance: 76.3 mL/min (by C-G formula based on SCr of 0.63 mg/dL).  Liver Function Tests: Recent Labs  Lab 10/24/19 1316 10/25/19 0352 10/26/19 1115 10/27/19 0613 10/28/19 0232  AST 46* 33 25 23 22   ALT 145* 123* 83* 68* 60*  ALKPHOS 114 108 86 78 71  BILITOT 1.1 0.8 0.8 1.0 1.0  PROT 6.7 7.2 6.5 6.5 6.1*  ALBUMIN 3.6 3.7 3.4* 3.4* 3.3*    CBG: Recent Labs  Lab 10/27/19 1726 10/28/19 0608  GLUCAP 127* 87     Recent Results (from the past 240 hour(s))  SARS Coronavirus 2 by RT PCR (hospital order, performed in [facility] hospital lab) Nasopharyngeal Nasopharyngeal Swab     Status: None   Collection Time: 10/24/19  1:12 PM   Specimen: Nasopharyngeal Swab  Result Value Ref Range Status   SARS Coronavirus 2 NEGATIVE NEGATIVE Final    Comment: (NOTE) SARS-CoV-2 target nucleic acids are NOT DETECTED.  The SARS-CoV-2 RNA is generally detectable in upper and lower respiratory specimens during the acute phase of infection. The lowest concentration of SARS-CoV-2 viral copies  this assay can detect is 250 copies / mL. A negative result does not preclude SARS-CoV-2 infection and should not be used as the sole basis for treatment or other patient management decisions.  A negative result may occur with improper specimen collection / handling,  submission of specimen other than nasopharyngeal swab, presence of viral mutation(s) within the areas targeted by this assay, and inadequate number of viral copies (<250 copies / mL). A negative result must be combined with clinical observations, patient history, and epidemiological information.  Fact Sheet for Patients:   StrictlyIdeas.no  Fact Sheet for Healthcare Providers: BankingDealers.co.za  This test is not yet approved or  cleared by the [place] FDA and has been authorized for detection and/or diagnosis of SARS-CoV-2 by FDA under an Emergency Use Authorization (EUA).  This EUA will remain in effect (meaning this test can be used) for the duration of the COVID-19 declaration under Section 564(b)(1) of the Act, 21 U.S.C. section 360bbb-3(b)(1), unless the authorization is terminated or revoked sooner.  Performed at [facility], [address] [place], [place] [address]   Blood Culture (routine x 2)     Status: None (Preliminary result)   Collection Time: 10/24/19  1:57 PM   Specimen: Right Antecubital; Blood  Result Value Ref Range Status   Specimen Description   Final    RIGHT ANTECUBITAL BOTTLES DRAWN AEROBIC AND ANAEROBIC   Special Requests Blood Culture adequate volume  Final   Culture   Final    NO GROWTH 3 DAYS Performed at [facility], [address], [place], [place] [address]    Report Status PENDING  Incomplete  Blood Culture (routine x 2)     Status: None (Preliminary result)   Collection Time: 10/24/19  1:57 PM   Specimen: Right Antecubital; Blood  Result Value Ref Range Status   Specimen Description   Final    RIGHT ANTECUBITAL BOTTLES DRAWN AEROBIC AND ANAEROBIC   Special Requests Blood Culture adequate volume  Final   Culture   Final    NO GROWTH 3 DAYS Performed at [facility], [address], [place], [place] [address]    Report Status PENDING  Incomplete  MRSA PCR Screening     Status: Abnormal    Collection Time: 10/25/19 11:18 AM   Specimen: Nasal Mucosa; Nasopharyngeal  Result Value Ref Range Status   MRSA by PCR POSITIVE (A) NEGATIVE Final    Comment:        The GeneXpert MRSA Assay (FDA approved for NASAL specimens only), is one component of a comprehensive MRSA colonization surveillance program. It is not intended to diagnose MRSA infection nor to guide or monitor treatment for MRSA infections. RESULT CALLED TO, READ BACK BY AND VERIFIED WITH: MURPHY,[email or web address]  BY MATTHEWS, B 9.9.2021 Performed at [facility], [address] [place], [place] [address]          Radiology Studies: ECHOCARDIOGRAM LIMITED  Result Date: 10/27/2019    ECHOCARDIOGRAM LIMITED REPORT   Patient Name:   TOCCARA ALFORD Date of Exam: 10/27/2019 Medical Rec #:  [id number]    Height:       65.0 in Accession #:    [id number]   Weight:       196.6 lb Date of Birth:  23-Feb-1955    BSA:          1.964 m Patient Age:    46 years     BP:           148/66 mmHg Patient  Gender: F            HR:           84 bpm. Exam Location:  Inpatient Procedure: Limited Echo, Limited Color Doppler and Cardiac Doppler Indications:     Dyspnea 786.09 / R06.00  History:         Patient has prior history of Echocardiogram examinations, most                  recent 08/10/2019. CAD; Prior CABG. Previous Myocardial                  Infarction, Prior                  CABG, COPD; Signs/Symptoms:Shortness of Breath. 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                  Parapneumonic right-sided effusion.  Sonographer:     Darlina Sicilian RDCS Referring Phys:  [id number] COURAGE EMOKPAE Diagnosing Phys: Fransico Him MD  Sonographer Comments: Image acquisition challenging due to patient behavioral factors. IMPRESSIONS  1. Left ventricular endocardial border not optimally defined to evaluate regional wall motion. Left ventricular diastolic function could not be evaluated.Patient was not cooperative with  exam.  2. Right ventricular systolic function was not well visualized. The right ventricular size is not well visualized.  3. The mitral valve is normal in structure. Mild mitral valve regurgitation. No evidence of mitral stenosis. Moderate mitral annular calcification.  4. The aortic valve is calcified. There is severe calcifcation of the aortic valve. There is moderate thickening of the aortic valve. Aortic valve regurgitation is not visualized. There is likely severe AS but study not sufficient for adequate assessment.  5. Recommend repeat 2D echo full study once patient able to cooperate. FINDINGS  Left Ventricle: Left ventricular endocardial border not optimally defined to evaluate regional wall motion. The left ventricular internal cavity size was normal in size. There is no left ventricular hypertrophy. Left ventricular diastolic function could  not be evaluated. Right Ventricle: The right ventricular size is not well visualized. Right vetricular wall thickness was not assessed. Right ventricular systolic function was not well visualized. Left Atrium: Left atrial size was not well visualized. Right Atrium: Right atrial size was not well visualized. Pericardium: There is no evidence of pericardial effusion. Mitral Valve: The mitral valve is normal in structure. Moderate mitral annular calcification. Mild mitral valve regurgitation. No evidence of mitral valve stenosis. Tricuspid Valve: The tricuspid valve is normal in structure. Tricuspid valve regurgitation is mild . No evidence of tricuspid stenosis. Aortic Valve: The aortic valve is calcified. There is severe calcifcation of the aortic valve. There is moderate thickening of the aortic valve. Aortic valve regurgitation is not visualized. There is likely severe AS but study not sufficient for adequate  assessment. Pulmonic Valve: The pulmonic valve was not assessed. Aorta: The aortic root is normal in size and structure. IAS/Shunts: No atrial level shunt  detected by color flow Doppler. LEFT VENTRICLE PLAX 2D LVOT diam:     1.90 cm LVOT Area:     2.84 cm   AORTA Ao Root diam: 2.80 cm  SHUNTS Systemic Diam: 1.90 cm Fransico Him MD Electronically signed by Fransico Him MD Signature Date/Time: 10/27/2019/2:08:25 PM    Final (Updated)         Scheduled  Meds: . aspirin  81 mg Oral Daily  . azithromycin  500 mg Oral Daily  . Chlorhexidine Gluconate Cloth  6 each Topical Daily  . Chlorhexidine Gluconate Cloth  6 each Topical Q0600  . ipratropium-albuterol  3 mL Nebulization QID  . isosorbide mononitrate  15 mg Oral Daily  . methylPREDNISolone (SOLU-MEDROL) injection  40 mg Intravenous Q12H  . metoprolol succinate  50 mg Oral Daily  . mometasone-formoterol  2 puff Inhalation BID  . mupirocin ointment  1 application Nasal BID  . pantoprazole  40 mg Oral Daily  . QUEtiapine  25 mg Oral QHS   Continuous Infusions: . sodium chloride    . ampicillin-sulbactam (UNASYN) IV 3 g (10/28/19 0530)  . heparin 950 Units/hr (10/27/19 2252)  . vancomycin 1,000 mg (10/28/19 8832)     LOS: 4 days        Hosie Poisson, MD [facility]   To contact the attending provider between 7A-7P or the covering provider during after hours 7P-7A, please log into the web site [email or web address] and access using universal [place] password for that web site. If you do not have the password, please call the hospital operator.  10/28/2019, 9:45 AM   Addendum:   Pt continues to be agitated, pulling out IV lines, combative, removing the BIPAP, despite IV haldol ,IV ativan and safety sitter at bedside and seroquel at night.  Further work up including TSH, ammonia levels have been wnl.  PCCM consulted to see if she needs transferred to ICU for precedex gtt .  RN made aware of the consult.    Hosie Poisson ,MD

## 2019-10-28 NOTE — Progress Notes (Signed)
Spoke with Devona Konig RN re midline order.  Stated that midline was needed for lab draws for heparin levels, pt confused and pulling at lines.  Notified that a midline is not placed for DBIV, pt currently has adequate IV access for meds.  Recommend central access if needed for labs once pt calm enough to place.  Hye Suk verbalizes agreement and understanding, to cancel midline order.

## 2019-10-28 NOTE — Progress Notes (Signed)
Spoke with Dr Karleen [place] and Devona Konig RN in person.  Pt too restless/agitated to place PICC at this time/ maintain sterility.  Pt currently has adequate PIV access for meds.  Multiple bruises noted BUE and swelling.  DrAkula aware that unable to place PICC.  Orders okay to reassess for PICC placement 10/29/19.  Pt pulling at lines, restless and squirming all over the bed, pulling at restraints from bedside assessment.   No family at bedside at this time.

## 2019-10-28 NOTE — Progress Notes (Signed)
Chief Complaint: Patient was seen in consultation today for right pleural effusion  Referring Physician(s): Dr. Kipp Brood  Supervising Physician: Jacqulynn Cadet  Patient Status: [facility] - In-pt  History of Present Illness: Emily Rocha is a 64 y.o. female with hx of recent right VATS RULobectomy for [facility] cancer. She has now developed recurrent loculated right pleural effusion. She was seen at [facility] where thoracentesis was not able to be performed and transferred to [facility], after CT surgery consult, IR is asked to place perc chest tube. PMHx, meds, labs, imaging, allergies reviewed. Pt confused and agitated overnight and this morning. Sitter at bedside   Past Medical History:  Diagnosis Date  . Anxiety   . Asthma   . COPD (chronic obstructive pulmonary disease) ([place])   . Coronary artery disease    hx CABG  . GERD (gastroesophageal reflux disease)   . Peripheral vascular disease ([place])   . Pneumonia   . Shortness of breath dyspnea   . Stroke [facility])    "mini stroke" numbness and weakness to left side of the body    Past Surgical History:  Procedure Laterality Date  . AORTIC ARCH ANGIOGRAPHY N/A 11/04/2017   Procedure: AORTIC ARCH ANGIOGRAPHY;  Surgeon: Elam Dutch, MD;  Location: [place] CV LAB;  Service: Cardiovascular;  Laterality: N/A;  . APPLICATION OF WOUND VAC N/A 01/06/2018   Procedure: APPLICATION OF WOUND VAC;  Surgeon: Ivin Poot, MD;  Location: [place];  Service: Thoracic;  Laterality: N/A;  . APPLICATION OF WOUND VAC N/A 01/13/2018   Procedure: WOUND VAC CHANGE;  Surgeon: Ivin Poot, MD;  Location: [place];  Service: Thoracic;  Laterality: N/A;  . CARDIAC CATHETERIZATION    . CAROTID-SUBCLAVIAN BYPASS GRAFT Right 12/26/2017   Procedure: AORTIC TO RIGHT COMMON CAROTID AND RIGHT SUBCLAVIAN  ARTERY  BYPASS;  Surgeon: Elam Dutch, MD;  Location: [place];  Service: Vascular;  Laterality: Right;  . CHOLECYSTECTOMY    . CLOSURE OF  DIAPHRAGM  12/26/2017   Procedure: REPAIR OF DIAPHRAGM;  Surgeon: Ivin Poot, MD;  Location: [place];  Service: Thoracic;;  . COLONOSCOPY N/A 10/03/2014   Procedure: COLONOSCOPY;  Surgeon: Rogene Houston, MD;  [facility];  Service: Endoscopy;  Laterality: N/A;  730  . CORONARY ARTERY BYPASS GRAFT N/A 12/14/2016   Procedure: CORONARY ARTERY BYPASS GRAFTING (CABG) x three , using left internal mammary artery and right leg greater saphenous vein harvested endoscopically;  Surgeon: Ivin Poot, MD;  Location: [place];  Service: Open Heart Surgery;  Laterality: N/A;  . ENDARTERECTOMY Right 12/06/2014   Procedure: ENDARTERECTOMY CAROTid;  Surgeon: Elam Dutch, MD;  Location: [facility] OR;  Service: Vascular;  Laterality: Right;  . ESOPHAGOGASTRODUODENOSCOPY N/A 12/03/2016   Procedure: ESOPHAGOGASTRODUODENOSCOPY (EGD);  Surgeon: Rogene Houston, MD;  [facility];  Service: Endoscopy;  Laterality: N/A;  7:30  . LEFT HEART CATH AND CORONARY ANGIOGRAPHY N/A 12/14/2016   Procedure: LEFT HEART CATH AND CORONARY ANGIOGRAPHY;  Surgeon: Burnell Blanks, MD;  Location: [place] [facility];  Service: Cardiovascular;  Laterality: N/A;  . LOBECTOMY Right 10/26/2018   Procedure: RIGHT UPPER LOBECTOMY;  Surgeon: Lajuana Matte, MD;  Location: [place];  Service: Thoracic;  Laterality: Right;  . PERIPHERAL VASCULAR CATHETERIZATION N/A 12/05/2014   Procedure:  Carotid Arch Angiography;  Surgeon: Conrad Vincent, MD;  Location: [place] [facility];  Service: Cardiovascular;  Laterality: N/A;  . STERNAL WOUND DEBRIDEMENT N/A  01/06/2018   Procedure: STERNAL WOUND DEBRIDEMENT;  Surgeon: Ivin Poot, MD;  Location: [place];  Service: Thoracic;  Laterality: N/A;  . STERNOTOMY N/A 12/26/2017   Procedure: REDO STERNOTOMY;  Surgeon: Prescott Gum, Collier Salina, MD;  Location: [place];  Service: Thoracic;  Laterality: N/A;  . TEE WITHOUT CARDIOVERSION N/A 12/14/2016   Procedure: TRANSESOPHAGEAL  ECHOCARDIOGRAM (TEE);  Surgeon: Prescott Gum, Collier Salina, MD;  Location: [place];  Service: Open Heart Surgery;  Laterality: N/A;  . VIDEO ASSISTED THORACOSCOPY (VATS)/WEDGE RESECTION Right 10/26/2018   Procedure: VIDEO ASSISTED THORACOSCOPY (VATS)/WEDGE RESECTION;  Surgeon: Lajuana Matte, MD;  Location: [place];  Service: Thoracic;  Laterality: Right;  Marland Kitchen VIDEO BRONCHOSCOPY N/A 10/26/2018   Procedure: VIDEO BRONCHOSCOPY;  Surgeon: Lajuana Matte, MD;  Location: [facility];  Service: Thoracic;  Laterality: N/A;    Allergies: Patient has no known allergies.  Medications: Prior to Admission medications   Medication Sig Start Date End Date Taking? Authorizing Provider  albuterol (PROVENTIL) (2.5 MG/3ML) 0.083% nebulizer solution Take 2.5 mg by nebulization every 4 (four) hours as needed. 03/30/19  Yes [provider]  ALPRAZolam Duanne Moron) 0.5 MG tablet Take 0.5 mg by mouth 3 (three) times daily.    Yes [provider]  Ascorbic Acid (VITAMIN C GUMMIE) 120 MG CHEW Chew 500 mg by mouth.   Yes [provider]  aspirin 81 MG chewable tablet Chew 81 mg by mouth daily.   Yes [provider]  atorvastatin (LIPITOR) 40 MG tablet Take 1 tablet (40 mg total) daily by mouth. 12/27/16  Yes Gold, Wayne E, PA-C  calcium carbonate (OS-CAL) 1250 (500 Ca) MG chewable tablet Chew 1 tablet by mouth daily.   Yes [provider]  cholecalciferol (VITAMIN D3) 25 MCG (1000 UNIT) tablet Take 1,000 Units by mouth daily.   Yes [provider]  diphenhydramine-acetaminophen (TYLENOL PM) 25-500 MG TABS tablet Take 2-3 tablets by mouth at bedtime.   Yes [provider]  isosorbide mononitrate (IMDUR) 30 MG 24 hr tablet Take 0.5 tablets (15 mg total) by mouth daily. 03/21/19  Yes BranchAlphonse Guild, MD  metoprolol succinate (TOPROL-XL) 50 MG 24 hr tablet Take 50 mg by mouth daily. Take with or immediately following a meal.   Yes [provider]  Multiple Vitamin  (MULTIVITAMIN WITH MINERALS) TABS tablet Take 1 tablet by mouth daily. 12/01/18  Yes Lars Pinks M, PA-C  pantoprazole (PROTONIX) 40 MG tablet Take 40 mg by mouth daily.   Yes [provider]  potassium chloride SA (K-DUR,KLOR-CON) 20 MEQ tablet Take 20 mEq by mouth daily.  10/20/17  Yes [provider]  Tiotropium Bromide-Olodaterol (STIOLTO RESPIMAT) 2.5-2.5 MCG/ACT AERS Inhale 2 puffs into the lungs daily. 07/19/19  Yes Mannam, Praveen, MD  torsemide (DEMADEX) 20 MG tablet Take 1 tablet (20 mg total) by mouth daily. 11/01/18  Yes Nani Skillern, PA-C     Family History  Problem Relation Age of Onset  . Heart attack Mother   . Cancer Mother   . Diabetes Mother   . Heart disease Mother   . Heart attack Father   . Heart disease Father     Social History   Socioeconomic History  . Marital status: Married    Spouse name: Not on file  . Number of children: Not on file  . Years of education: Not on file  . Highest education level: Not on file  Occupational History  . Not on file  Tobacco Use  .  Smoking status: Former Smoker    Packs/day: 2.00    Years: 40.00    Pack years: 80.00    Quit date: 10/24/2015    Years since quitting: 4.0  . Smokeless tobacco: Never Used  Vaping Use  . Vaping Use: Former  . Start date: 10/23/2013  . Quit date: 10/24/2015  Substance and Sexual Activity  . Alcohol use: No    Alcohol/week: 0.0 standard drinks  . Drug use: No  . Sexual activity: Not on file  Other Topics Concern  . Not on file  Social History Narrative  . Not on file   Social Determinants of Health   Financial Resource Strain:   . Difficulty of Paying Living Expenses: Not on file  Food Insecurity:   . Worried About Charity fundraiser in the Last Year: Not on file  . Ran Out of Food in the Last Year: Not on file  Transportation Needs:   . Lack of Transportation (Medical): Not on file  . Lack of Transportation (Non-Medical): Not on file  Physical  Activity:   . Days of Exercise per Week: Not on file  . Minutes of Exercise per Session: Not on file  Stress:   . Feeling of Stress : Not on file  Social Connections:   . Frequency of Communication with Friends and Family: Not on file  . Frequency of Social Gatherings with Friends and Family: Not on file  . Attends Religious Services: Not on file  . Active Member of Clubs or Organizations: Not on file  . Attends Archivist Meetings: Not on file  . Marital Status: Not on file      Review of Systems: A 12 point ROS discussed and pertinent positives are indicated in the HPI above.  All other systems are negative.  Review of Systems  Vital Signs: BP 124/69 (BP Location: Left Wrist)   Pulse 92   Temp 97.6 F (36.4 C) (Oral)   Resp (!) 24   Ht 5\' 5"  (1.651 m)   Wt 89.2 kg   SpO2 96%   BMI 32.72 kg/m   Physical Exam Constitutional:      General: She is not in acute distress.    Appearance: She is not ill-appearing or toxic-appearing.  HENT:     Mouth/Throat:     Mouth: Mucous membranes are moist.     Pharynx: Oropharynx is clear.  Cardiovascular:     Rate and Rhythm: Normal rate and regular rhythm.     Heart sounds: Normal heart sounds.  Pulmonary:     Effort: Pulmonary effort is normal.     Comments: Diminished (R)breath sounds Neurological:     Comments: Somewhat somnolent after receiving Ativan     Imaging: CT HEAD WO CONTRAST  Result Date: 10/25/2019 CLINICAL DATA:  64 year old female with hypoxia and altered mental status. EXAM: CT HEAD WITHOUT CONTRAST TECHNIQUE: Contiguous axial images were obtained from the base of the skull through the vertex without intravenous contrast. COMPARISON:  Head CT 09/28/2017. FINDINGS: Brain: No midline shift, ventriculomegaly, mass effect, evidence of mass lesion, intracranial hemorrhage or evidence of cortically based acute infarction. Scattered, patchy bilateral cerebral white matter hypodensity appears not significantly  changed. Otherwise gray-white matter differentiation remains within normal limits. Vascular: Calcified atherosclerosis at the skull base. Skull: No acute osseous abnormality identified. Sinuses/Orbits: Visualized paranasal sinuses and mastoids are stable and well pneumatized. Other: Visualized orbits and scalp soft tissues are within normal limits. IMPRESSION: 1. No acute intracranial abnormality by  CT. 2. Patchy chronic white matter changes, most commonly due to chronic small vessel disease. Electronically Signed   By: Genevie Ann M.D.   On: 10/25/2019 16:10   CT Angio Chest PE W and/or Wo Contrast  Result Date: 10/24/2019 CLINICAL DATA:  Chest pain. EXAM: CT ANGIOGRAPHY CHEST WITH CONTRAST TECHNIQUE: Multidetector CT imaging of the chest was performed using the standard protocol during bolus administration of intravenous contrast. Multiplanar CT image reconstructions and MIPs were obtained to evaluate the vascular anatomy. CONTRAST:  124mL OMNIPAQUE IOHEXOL 350 MG/ML SOLN COMPARISON:  February 27, 2019. FINDINGS: Cardiovascular: Atherosclerosis of thoracic aorta is noted without aneurysm or dissection. Status post coronary bypass graft. The patient is status post right upper lobectomy. There is the interval development of thrombus within the most distal portion of the stump of the right upper lobe branch of the right pulmonary artery. No definite embolus is noted. Mild cardiomegaly is noted. No pericardial effusion is noted. Mediastinum/Nodes: Moderate size sliding-type hiatal hernia is noted. Thyroid gland is unremarkable. No adenopathy is noted. Lungs/Pleura: No pneumothorax is noted. Left lung is clear. Moderate size loculated right pleural effusion is noted with associated subsegmental atelectasis of the right lower lobe. Status post right upper lobectomy as described above. Upper Abdomen: No acute abnormality. Musculoskeletal: No chest wall abnormality. No acute or significant osseous findings. Review of the  MIP images confirms the above findings. IMPRESSION: 1. Status post right upper lobectomy. Interval development of thrombus within the most distal portion of the residual stump of the right upper lobe branch of the right pulmonary artery. No definite pulmonary embolus is noted. 2. Moderate size loculated right pleural effusion is noted with associated subsegmental atelectasis of the right lower lobe. 3. Moderate size sliding-type hiatal hernia. 4. Aortic atherosclerosis. Aortic Atherosclerosis (ICD10-I70.0). Electronically Signed   By: Marijo Conception M.D.   On: 10/24/2019 15:47   [place] CHEST (PLEURAL EFFUSION)  Result Date: 10/25/2019 CLINICAL DATA:  RIGHT pleural effusion by CT, history of COPD, GERD, RIGHT upper lobe lung cancer post upper lobectomy EXAM: CHEST ULTRASOUND COMPARISON:  CT angio chest 10/24/2019 FINDINGS: Small pleural effusion identified at the RIGHT lung base. Effusion is partially loculated. Atelectatic RIGHT lower lobe is adherent to the posterior chest wall and prevents safe access of the inferior thoracic cavity for thoracentesis. This appearance was seen on the prior CT but appears fixed with change in position on ultrasound. IMPRESSION: Small loculated RIGHT pleural effusion, unable to safely access for thoracentesis. Electronically Signed   By: Lavonia Dana M.D.   On: 10/25/2019 11:00   DG Chest Portable 1 View  Result Date: 10/24/2019 CLINICAL DATA:  Shortness of breath with fever and chills EXAM: PORTABLE CHEST 1 VIEW COMPARISON:  May 25, 2019 FINDINGS: There is a small right pleural effusion. There is postoperative change with scarring on the right. There is no edema or airspace opacity. Heart is upper normal in size with pulmonary vascularity normal. Patient is status post internal mammary bypass grafting. No adenopathy. No bone lesions. IMPRESSION: Small right pleural effusion. Scarring with volume loss on the right. No frank edema or airspace opacity. Stable cardiac silhouette.  Postoperative changes noted. Electronically Signed   By: Lowella Grip III M.D.   On: 10/24/2019 13:32   ECHOCARDIOGRAM LIMITED  Result Date: 10/27/2019    ECHOCARDIOGRAM LIMITED REPORT   Patient Name:   Emily Rocha Date of Exam: 10/27/2019 Medical Rec #:  [id number]    Height:  65.0 in Accession #:    [id number]   Weight:       196.6 lb Date of Birth:  Nov 10, 1955    BSA:          1.964 m Patient Age:    53 years     BP:           148/66 mmHg Patient Gender: F            HR:           84 bpm. Exam Location:  Inpatient Procedure: Limited Echo, Limited Color Doppler and Cardiac Doppler Indications:     Dyspnea 786.09 / R06.00  History:         Patient has prior history of Echocardiogram examinations, most                  recent 08/10/2019. CAD; Prior CABG. Previous Myocardial                  Infarction, Prior                  CABG, COPD; Signs/Symptoms:Shortness of Breath. 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                  Parapneumonic right-sided effusion.  Sonographer:     Darlina Sicilian RDCS Referring Phys:  [id number] COURAGE EMOKPAE Diagnosing Phys: Fransico Him MD  Sonographer Comments: Image acquisition challenging due to patient behavioral factors. IMPRESSIONS  1. Left ventricular endocardial border not optimally defined to evaluate regional wall motion. Left ventricular diastolic function could not be evaluated.Patient was not cooperative with exam.  2. Right ventricular systolic function was not well visualized. The right ventricular size is not well visualized.  3. The mitral valve is normal in structure. Mild mitral valve regurgitation. No evidence of mitral stenosis. Moderate mitral annular calcification.  4. The aortic valve is calcified. There is severe calcifcation of the aortic valve. There is moderate thickening of the aortic valve. Aortic valve regurgitation is not visualized. There is likely severe AS but study not sufficient for  adequate assessment.  5. Recommend repeat 2D echo full study once patient able to cooperate. FINDINGS  Left Ventricle: Left ventricular endocardial border not optimally defined to evaluate regional wall motion. The left ventricular internal cavity size was normal in size. There is no left ventricular hypertrophy. Left ventricular diastolic function could  not be evaluated. Right Ventricle: The right ventricular size is not well visualized. Right vetricular wall thickness was not assessed. Right ventricular systolic function was not well visualized. Left Atrium: Left atrial size was not well visualized. Right Atrium: Right atrial size was not well visualized. Pericardium: There is no evidence of pericardial effusion. Mitral Valve: The mitral valve is normal in structure. Moderate mitral annular calcification. Mild mitral valve regurgitation. No evidence of mitral valve stenosis. Tricuspid Valve: The tricuspid valve is normal in structure. Tricuspid valve regurgitation is mild . No evidence of tricuspid stenosis. Aortic Valve: The aortic valve is calcified. There is severe calcifcation of the aortic valve. There is moderate thickening of the aortic valve. Aortic valve regurgitation is not visualized. There is likely severe AS but study not sufficient for adequate  assessment. Pulmonic Valve: The pulmonic valve was not assessed. Aorta: The aortic root is normal in size and structure. IAS/Shunts: No atrial level shunt detected by color  flow Doppler. LEFT VENTRICLE PLAX 2D LVOT diam:     1.90 cm LVOT Area:     2.84 cm   AORTA Ao Root diam: 2.80 cm  SHUNTS Systemic Diam: 1.90 cm Fransico Him MD Electronically signed by Fransico Him MD Signature Date/Time: 10/27/2019/2:08:25 PM    Final (Updated)     Labs:  CBC: Recent Labs    10/25/19 0352 10/26/19 0534 10/27/19 0613 10/28/19 0232  WBC 12.3* 18.6* 9.9 6.3  HGB 11.7* 10.4* 10.7* 9.4*  HCT 39.0 34.8* 36.2 31.9*  PLT 215 173 144* 146*    COAGS: No results  for input(s): INR, APTT in the last 8760 hours.  BMP: Recent Labs    10/25/19 0352 10/25/19 0352 10/26/19 0534 10/26/19 1115 10/27/19 0613 10/28/19 0232  NA 129*  --   --  130* 132* 134*  K 5.7*  --   --  5.4* 5.6* 5.1  CL 92*  --   --  93* 93* 95*  CO2 27  --   --  28 29 33*  GLUCOSE 154*  --   --  147* 154* 111*  BUN 33*  --   --  30* 27* 20  CALCIUM 8.4*  --   --  7.9* 8.2* 8.2*  CREATININE 0.70   < > 0.69 0.58 0.51 0.63  GFRNONAA >60   < > >60 >60 >60 >60  GFRAA >60   < > >60 >60 >60 >60   < > = values in this interval not displayed.    LIVER FUNCTION TESTS: Recent Labs    10/25/19 0352 10/26/19 1115 10/27/19 0613 10/28/19 0232  BILITOT 0.8 0.8 1.0 1.0  AST 33 25 23 22   ALT 123* 83* 68* 60*  ALKPHOS 108 86 78 71  PROT 7.2 6.5 6.5 6.1*  ALBUMIN 3.7 3.4* 3.4* 3.3*    TUMOR MARKERS: No results for input(s): AFPTM, CEA, CA199, CHROMGRNA in the last 8760 hours.  Assessment and Plan: Loculated right pleural effusion Imaging reviewed. Plan for image guided right chest tube placement tomorrow. NPO p MN Hep gtt will need to be stopped once procedure time known. Trying to reach husband to discuss procedure and obtain consent, so far no answer.  Thank you for this interesting consult.  I greatly enjoyed meeting Emily Rocha and look forward to participating in their care.  A copy of this report was sent to the requesting provider on this date.  Electronically Signed: Ascencion Dike, PA-C 10/28/2019, 10:56 AM   I spent a total of 20 minutes in face to face in clinical consultation, greater than 50% of which was counseling/coordinating care for (R)pleural effusion/chest tube

## 2019-10-29 ENCOUNTER — Inpatient Hospital Stay (HOSPITAL_COMMUNITY): Payer: Medicare HMO

## 2019-10-29 DIAGNOSIS — J918 Pleural effusion in other conditions classified elsewhere: Secondary | ICD-10-CM

## 2019-10-29 DIAGNOSIS — I2699 Other pulmonary embolism without acute cor pulmonale: Secondary | ICD-10-CM

## 2019-10-29 LAB — BASIC METABOLIC PANEL
Anion gap: 7 (ref 5–15)
BUN: 19 mg/dL (ref 8–23)
CO2: 32 mmol/L (ref 22–32)
Calcium: 8.1 mg/dL — ABNORMAL LOW (ref 8.9–10.3)
Chloride: 97 mmol/L — ABNORMAL LOW (ref 98–111)
Creatinine, Ser: 0.61 mg/dL (ref 0.44–1.00)
GFR calc Af Amer: >60 mL/min (ref >60)
GFR calc non Af Amer: >60 mL/min (ref >60)
Glucose, Bld: 105 mg/dL — ABNORMAL HIGH (ref 70–99)
Potassium: 5.2 mmol/L — ABNORMAL HIGH (ref 3.5–5.1)
Sodium: 136 mmol/L (ref 135–145)

## 2019-10-29 LAB — CBC
HCT: 30.5 % — ABNORMAL LOW (ref 36.0–46.0)
Hemoglobin: 9.2 g/dL — ABNORMAL LOW (ref 12.0–15.0)
MCH: 27.4 pg (ref 26.0–34.0)
MCHC: 30.2 g/dL (ref 30.0–36.0)
MCV: 90.8 fL (ref 80.0–100.0)
Platelets: 159 10*3/uL (ref 150–400)
RBC: 3.36 MIL/uL — ABNORMAL LOW (ref 3.87–5.11)
RDW: 15.5 % (ref 11.5–15.5)
WBC: 5.3 10*3/uL (ref 4.0–10.5)
nRBC: 1.9 % — ABNORMAL HIGH (ref 0.0–0.2)

## 2019-10-29 LAB — CULTURE, BLOOD (ROUTINE X 2)
Culture: NO GROWTH
Culture: NO GROWTH
Special Requests: ADEQUATE
Special Requests: ADEQUATE

## 2019-10-29 LAB — GLUCOSE, CAPILLARY
Glucose-Capillary: 103 mg/dL — ABNORMAL HIGH (ref 70–99)
Glucose-Capillary: 109 mg/dL — ABNORMAL HIGH (ref 70–99)
Glucose-Capillary: 114 mg/dL — ABNORMAL HIGH (ref 70–99)
Glucose-Capillary: 121 mg/dL — ABNORMAL HIGH (ref 70–99)
Glucose-Capillary: 145 mg/dL — ABNORMAL HIGH (ref 70–99)
Glucose-Capillary: 99 mg/dL (ref 70–99)

## 2019-10-29 LAB — HEPARIN LEVEL (UNFRACTIONATED)
Heparin Unfractionated: <0.1 IU/mL — ABNORMAL LOW (ref 0.30–0.70)
Heparin Unfractionated: 0.3 IU/mL (ref 0.30–0.70)

## 2019-10-29 LAB — VANCOMYCIN, TROUGH: Vancomycin Tr: 17 ug/mL (ref 15–20)

## 2019-10-29 MED ORDER — ASPIRIN 300 MG RE SUPP: 150.0000 mg | Freq: Every day | RECTAL | Status: DC

## 2019-10-29 MED ORDER — THIAMINE HCL 100 MG/ML IJ SOLN
100.0000 mg | Freq: Every day | INTRAMUSCULAR | Status: DC
  Administered 2019-10-29 – 2019-11-06 (×9): 100 mg via INTRAVENOUS
  Filled 2019-10-29 (×9): qty 2

## 2019-10-29 MED ORDER — LIDOCAINE HCL 1 % IJ SOLN
INTRAMUSCULAR | Status: AC
  Filled 2019-10-29: qty 20

## 2019-10-29 MED ORDER — FENTANYL CITRATE (PF) 100 MCG/2ML IJ SOLN
INTRAMUSCULAR | Status: AC
  Filled 2019-10-29: qty 2

## 2019-10-29 MED ORDER — HALOPERIDOL LACTATE 5 MG/ML IJ SOLN
2.0000 mg | INTRAMUSCULAR | Status: DC | PRN
  Administered 2019-10-29 – 2019-10-30 (×2): 4 mg via INTRAVENOUS
  Administered 2019-10-30: 3 mg via INTRAVENOUS
  Administered 2019-10-31: 4 mg via INTRAVENOUS
  Filled 2019-10-29 (×4): qty 1

## 2019-10-29 MED ORDER — FOLIC ACID 5 MG/ML IJ SOLN
1.0000 mg | Freq: Every day | INTRAMUSCULAR | Status: DC
  Administered 2019-10-29 – 2019-11-06 (×9): 1 mg via INTRAVENOUS
  Filled 2019-10-29 (×12): qty 0.2

## 2019-10-29 MED ORDER — PANTOPRAZOLE SODIUM 40 MG IV SOLR
40.0000 mg | Freq: Every day | INTRAVENOUS | Status: DC
  Administered 2019-10-29 – 2019-11-01 (×4): 40 mg via INTRAVENOUS
  Filled 2019-10-29 (×4): qty 40

## 2019-10-29 MED ORDER — METOPROLOL TARTRATE 5 MG/5ML IV SOLN
5.0000 mg | Freq: Three times a day (TID) | INTRAVENOUS | Status: DC
  Administered 2019-10-29 – 2019-10-30 (×2): 5 mg via INTRAVENOUS
  Filled 2019-10-29 (×2): qty 5

## 2019-10-29 NOTE — Progress Notes (Signed)
NAME:  Emily Rocha, MRN:  [id number], DOB:  08/03/1955, LOS: 5 ADMISSION DATE:  10/24/2019, CONSULTATION DATE:  9/12 REFERRING MD:  Karleen Hampshire, CHIEF COMPLAINT:  Delirium    Brief History   64 year old female with history of COPD, coronary artery disease, adenocarcinoma of the right lung requiring VATS and right upper lobe lobectomy, also newly identified hiatal hernia admitted initially with pneumonia and what appears to be complicated right pleural effusion at [facility] subsequently transferred to [facility] on 9/11 for cardiothoracic evaluation and interventional radiology drainage.  Critical care asked to evaluate the patient on 9/12 for refractory delirium, and concern about progressive hypercarbic respiratory failure.  History of present illness   This is a 64 year old female with history as mentioned below.  Admitted on 9/8 with chief complaint of delirium, and worsening respiratory distress found to have large loculated right-sided pleural effusion, she was initially admitted at [facility].  There was attempt to drain this unsuccessfully, she was therefore transferred to [facility] for thoracic surgery consultation and CT-guided chest tube drainage.  On CT evaluation with CT angiogram was also found to have interval development of thrombus in the most distal portion of the right upper lobe branch of the right pulmonary artery, was therefore started on anticoagulation also on imaging found to have large hiatal hernia.SinceShe was transferred to [facility] on 9/11, arriving at [facility] she has had worsening and progressive delirium, requiring frequent doses of Ativan, increased doses of Haldol, and direct one-on-one safety sitter as she has continued to pull out tubes and lines.  Additional evaluation has included arterial blood gas most recently showing pH of 7.34, PCO2 of 63, PO2 of 86, bicarbonate of 33.  Ammonia level 25, TSH level normal.  She has been seen by  both thoracic surgery and interventional cardiology.  Plan is for CT-guided drainage, stopping heparin drip in anticipation of this.  Plan is to place this on 9/13.  Because of worsening delirium critical care was asked to evaluate.  Past Medical History   COPD, coronary artery disease with prior CABG 2018, adenocarcinoma of the lung status post VATS with right upper lobectomy, moderate to severe aortic stenosis  Significant Hospital Events   9/9 admitted to [facility] antibiotic started, seen in consultation by [place] we recommended thoracic surgery evaluation 9/10: Developing worsening delirium felt secondary to infection, as well as hyponatremia.  Administered repeated doses of Ativan and Haldol 9/12, continues to have refractory delirium in spite of repeated doses of Ativan and Haldol.  Not tolerating BiPAP.  Pulling out IVs.  Started on precedex   Consults:  Interventional radiology Cardiothoracic surgery   Procedures:    Significant Diagnostic Tests:  CT chest 9/8: This demonstrated prior postoperative changes of the right lobe upper lobectomy, new interval development of thrombus within the most distal portion of right upper lobe pulmonary artery branch feeding to residual stump.  Moderate sized loculated right effusion with subsegmental atelectasis and what appears to be some degree of loculation moderate sized sliding-type hiatal hernia and aortic arteriosclerosis. 9/9: CT of head negative for acute findings  9/13 duplex BLE >> neg  9/11 echo >> severe AS   Micro Data:   9/8 blood culture x2>>>ng MRSA PCR positive Coronavirus/SARS: Negative Antimicrobials:  Unasyn 9/8?>>> Azithromycin 9/8>>>9/13 Vancomycin 9/9>>>   Interim history/subjective:   Remains confused on Precedex drip, intermittently pulling out mittens and agitated Afebrile  Objective   Blood pressure 127/79, pulse 66, temperature  98.5 F (36.9 C), temperature source Oral, resp. rate 16, height  5\' 5"  (1.651 m), weight 90 kg, SpO2 99 %.    FiO2 (%):  [28 %] 28 %   Intake/Output Summary (Last 24 hours) at 10/29/2019 0819 Last data filed at 10/29/2019 0600 Gross per 24 hour  Intake 1277.28 ml  Output 300 ml  Net 977.28 ml   Filed Weights   10/28/19 0446 10/28/19 2010 10/29/19 0500  Weight: 89.2 kg 89.5 kg 90 kg    Examination: General: Elderly, mild distress, intermittently agitated HENT: Dry mucosa, mild pallor, no icterus Lungs: Decreased breath sounds bilateral, no rhonchi, no accessory muscle use Cardiovascular: Regular rate and rhythm with loud systolic murmur consistent with aortic stenosis Abdomen: Soft nontender Extremities: Edematous, multiple areas of ecchymosis pulses brisk Neuro: Awake, oriented to name, recognizes husband and converses but very confused GU: Clear yellow   Labs show mild hyperkalemia, no leukocytosis, stable anemia  Resolved Hospital Problem list   No  Assessment & Plan:  Pneumonia with complicated right pleural effusion. ?  Aspiration related to hiatal hernia Plan Continue Unasyn and vancomycin CT-guided chest tube placed by IR , not a great candidate for VATS thoracotomy May need TPA  and DNase+   Agitated delirium/acute metabolic encephalopathy -multifactorial, infection, hypercarbia, hyponatremia is improved Plan Use Precedex with goal RASS 0 to -1 Decrease steroids, could be a contributing factor Hopefully source control of infection with chest tube drainage would help  Acute hypercarbic respiratory failure, PCO2 in the 60s.  Has underlying history of COPD, not convinced she is actually in acute exacerbation, suspect sedating medications, underlying lung disease, and new pneumonia/complicated right pleural effusion are the contributing factors to this Plan Scheduled bronchodilators Solu-Medrol 20 daily BiPAP as needed  Acute pulmonary thrombus.  Incidentally found in the right pulmonary artery leading to old surgical  stump Plan IV heparin, currently held for procedure We will likely need at least 3 to 6 months of systemic anticoagulation  Severe aortic stenosis, with chronic diastolic heart failure grade 1 Plan Telemetry Avoid tachyarrhythmias and fluid overload  Hyponatremia -improved  History of coronary artery disease Plan Continuing Lopressor, aspirin, and Imdur as able  Remote history of adenocarcinoma of the lung status post right upper lobe lobectomy 2018 Plan Outpatient follow-up   Goals of care -if agitation not adequately controlled may have to intubate her for procedure.  Discussed with the husband he would like to avoid intubation except as a last resort   Best practice:  Diet: N.p.o. Pain/Anxiety/Delirium protocol (if indicated):  Precedex drip VAP protocol (if indicated): Not indicated DVT prophylaxis: IV heparin GI prophylaxis: PPI Glucose control: sliding scale Mobility: Bedrest Code Status: Full code Family Communication: Husband at bedside Disposition: ICU  Labs   CBC: Recent Labs  Lab 10/24/19 1316 10/24/19 1316 10/25/19 0352 10/26/19 0534 10/27/19 0613 10/28/19 0232 10/29/19 0555  WBC 12.4*   < > 12.3* 18.6* 9.9 6.3 5.3  NEUTROABS 9.1*  --   --   --   --   --   --   HGB 12.1   < > 11.7* 10.4* 10.7* 9.4* 9.2*  HCT 39.7   < > 39.0 34.8* 36.2 31.9* 30.5*  MCV 89.4   < > 91.3 91.6 92.3 89.9 90.8  PLT 224   < > 215 173 144* 146* 159   < > = values in this interval not displayed.    Basic Metabolic Panel: Recent Labs  Lab 10/25/19 0352 10/25/19 0352 10/26/19 0534  10/26/19 1115 10/27/19 0613 10/28/19 0232 10/28/19 1355 10/29/19 0555  NA 129*  --   --  130* 132* 134*  --  136  K 5.7*  --   --  5.4* 5.6* 5.1  --  5.2*  CL 92*  --   --  93* 93* 95*  --  97*  CO2 27  --   --  28 29 33*  --  32  GLUCOSE 154*  --   --  147* 154* 111*  --  105*  BUN 33*  --   --  30* 27* 20  --  19  CREATININE 0.70   < > 0.69 0.58 0.51 0.63  --  0.61  CALCIUM 8.4*   --   --  7.9* 8.2* 8.2*  --  8.1*  MG  --   --   --   --   --   --  2.9*  --    < > = values in this interval not displayed.   GFR: Estimated Creatinine Clearance: 76.7 mL/min (by C-G formula based on SCr of 0.61 mg/dL). Recent Labs  Lab 10/24/19 1316 10/24/19 1357 10/24/19 1655 10/25/19 0352 10/26/19 0534 10/27/19 0613 10/28/19 0232 10/29/19 0555  PROCALCITON 0.15  --   --   --   --   --   --   --   WBC 12.4*  --   --    < > 18.6* 9.9 6.3 5.3  LATICACIDVEN  --  1.2 1.1  --   --   --   --   --    < > = values in this interval not displayed.    Liver Function Tests: Recent Labs  Lab 10/24/19 1316 10/25/19 0352 10/26/19 1115 10/27/19 0613 10/28/19 0232  AST 46* 33 25 23 22   ALT 145* 123* 83* 68* 60*  ALKPHOS 114 108 86 78 71  BILITOT 1.1 0.8 0.8 1.0 1.0  PROT 6.7 7.2 6.5 6.5 6.1*  ALBUMIN 3.6 3.7 3.4* 3.4* 3.3*   No results for input(s): LIPASE, AMYLASE in the last 168 hours. Recent Labs  Lab 10/28/19 1355  AMMONIA 25    ABG    Component Value Date/Time   PHART 7.341 (L) 10/28/2019 1145   PCO2ART 63.2 (H) 10/28/2019 1145   PO2ART 86.4 10/28/2019 1145   HCO3 33.3 (H) 10/28/2019 1145   TCO2 30 10/26/2018 1241   O2SAT 97.3 10/28/2019 1145     My independent critical care time x 11m  Kara Mead MD. FCCP. [facility]  If no response to pager , please call [phone]  After 7:00 pm call Elink  [phone]   10/29/2019

## 2019-10-29 NOTE — Progress Notes (Signed)
OT Cancellation Note  Patient Details Name: Emily Rocha MRN: [id number] DOB: May 03, 1955   Cancelled Treatment:    Reason Eval/Treat Not Completed: Patient not medically ready (Pt agitated, no appropriate for OT)  [name] 10/29/2019, 8:18 AM  [name], OTR/L Acute Rehabilitation Services Pager: [phone] Office: [phone]

## 2019-10-29 NOTE — Progress Notes (Signed)
Lower Ext study completed.   See CVProc for preliminary results.   Griffin Basil, RDMS, RVT

## 2019-10-29 NOTE — Progress Notes (Signed)
PT Cancellation Note  Patient Details Name: Emily Rocha MRN: [id number] DOB: Feb 23, 1955   Cancelled Treatment:    Reason Eval/Treat Not Completed: Patient not medically ready (pt confused, agitated, combative and RN states not appropriate this am)   [name] B [name] 10/29/2019, 7:50 AM  [name], PT Acute Rehabilitation Services Pager: [phone] Office: [phone]

## 2019-10-29 NOTE — Progress Notes (Signed)
Interventional Radiology Brief Update  Emily Rocha was unable to tolerate lying prone or in the decubitus position on CT table for attempted drainage of right pleural effusion.  A dose of haldol was given for agitation. She was moved back to her bed and ultrasound was used to examine the right thorax.  There was small volume, complex pleural fluid.  During this scan she was unable to tolerate sitting upright due to agitation, despite haldol.   Given small volume of complex fluid and agitation the planned procedure was aborted.  If the patient's mental status improves to the point she can lie prone, CT-guided thoracostomy can be re-attempted if clinically indicated.      Ruthann Cancer, MD

## 2019-10-29 NOTE — Progress Notes (Signed)
Pharmacy Antibiotic Note  Emily Rocha is a 64 y.o. female admitted on 10/24/2019 with pneumonia.  Pharmacy has been consulted for Unasyn and Vancomycin dosing. Wbc now wnl, afebrile, cr 0.6 crcl 78ml/min VT 17  Plan for IR to drain empyema this week  Plan: Vancomycin 1000 mg IV every 12 hours. Unasyn 3GM IV every 8 hours. Monitor labs, c/s, and vanco level as indicated.  Height: 5\' 5"  (165.1 cm) Weight: 90 kg (198 lb 6.6 oz) IBW/kg (Calculated) : 57  Temp (24hrs), Avg:98.5 F (36.9 C), Min:97.7 F (36.5 C), Max:99 F (37.2 C)  Recent Labs  Lab 10/24/19 1316 10/24/19 1357 10/24/19 1655 10/25/19 0352 10/25/19 0352 10/26/19 0534 10/26/19 1115 10/27/19 0613 10/28/19 0232 10/29/19 0555  WBC   < >  --   --  12.3*  --  18.6*  --  9.9 6.3 5.3  CREATININE   < >  --   --  0.70   < > 0.69 0.58 0.51 0.63 0.61  LATICACIDVEN  --  1.2 1.1  --   --   --   --   --   --   --   VANCOTROUGH  --   --   --   --   --   --   --   --   --  17   < > = values in this interval not displayed.    Estimated Creatinine Clearance: 76.7 mL/min (by C-G formula based on SCr of 0.61 mg/dL).    No Known Allergies  Antimicrobials this admission: Vanco 9/9 >>  Unasyn 9/8 >>  azith 9/8>9/12  Microbiology results: 9/8 BCx: ngtd 9/8 Sputum Cx: ip 9/9 MRSA PCR: positive   Bonnita Nasuti Pharm.D. CPP, BCPS Clinical Pharmacist [phone] 10/29/2019 12:47 PM

## 2019-10-29 NOTE — Progress Notes (Signed)
[place] for heparin IV Indication: Rule out ACS/ thrombus seen on CT  No Known Allergies  Patient Measurements: Height: 5\' 5"  (165.1 cm) Weight: 90 kg (198 lb 6.6 oz) IBW/kg (Calculated) : 57 Heparin Dosing Weight: 73 kg  Vital Signs: Temp: 97.7 F (36.5 C) (09/13 1055) Temp Source: Oral (09/13 1055) BP: 94/78 (09/13 1201) Pulse Rate: 63 (09/13 1201)  Labs: Recent Labs    10/27/19 0613 10/27/19 1045 10/28/19 0232 10/28/19 1355 10/29/19 0555 10/29/19 1246  HGB 10.7*  --  9.4*  --  9.2*  --   HCT 36.2  --  31.9*  --  30.5*  --   PLT 144*  --  146*  --  159  --   HEPARINUNFRC  --    < >  --  0.55 <0.10* 0.30  CREATININE 0.51  --  0.63  --  0.61  --    < > = values in this interval not displayed.    Estimated Creatinine Clearance: 76.7 mL/min (by C-G formula based on SCr of 0.61 mg/dL).   Medical History: Past Medical History:  Diagnosis Date  . Anxiety   . Asthma   . COPD (chronic obstructive pulmonary disease) ([place])   . Coronary artery disease    hx CABG  . GERD (gastroesophageal reflux disease)   . Peripheral vascular disease ([place])   . Pneumonia   . Shortness of breath dyspnea   . Stroke [facility])    "mini stroke" numbness and weakness to left side of the body    Assessment: IV heparin for elevated troponins and thrombus on CT (not PE). Hgb 12.1. Ddimer 5.27 on admit.  - 9/8 CT: s/p RU lobectomy. Interval development of thrombus within the most distal portion of the residual stump of the right upper lobe branch of the right pulmonary artery. No definite pulmonary embolus is noted.  Heparin level 0.3 at goal on heparin drip 950 uts/hr. No issues noted.  CBC stable   Goal of Therapy:  Heparin level 0.3-0.7 units/ml Monitor platelets by anticoagulation protocol: Yes   Plan:  Continue heparin infusion at 950 units/hr  Check anti-Xa level daily while on heparin Continue to monitor H&H and platelets   Bonnita Nasuti Pharm.D. CPP, BCPS Clinical Pharmacist [phone] 10/29/2019 2:18 PM

## 2019-10-29 NOTE — Sedation Documentation (Signed)
Procedure aborted. Patient transported back to [place] with patients RN.

## 2019-10-29 NOTE — Sedation Documentation (Signed)
Patient on the CT Table at this time. Patient is very agitated and continues to try and move off the table. Patient is in mitts at this time and staff attempting to keep patient on the table. Dr. Serafina Royals at the bedside and consulting pulmonary medicine physician at this time.

## 2019-10-29 NOTE — Progress Notes (Addendum)
      [address]       [place] [address]             [phone]      Subjective:   Patient remains confused, in restraints  Objective: Vital signs in last 24 hours: Temp:  [97.6 F (36.4 C)-99 F (37.2 C)] 98.5 F (36.9 C) (09/13 0733) Pulse Rate:  [64-104] 73 (09/13 0600) Cardiac Rhythm: Normal sinus rhythm (09/13 0400) Resp:  [0-26] 19 (09/13 0600) BP: (103-152)/(57-90) 107/74 (09/13 0600) SpO2:  [92 %-100 %] 99 % (09/13 0600) Weight:  [89.5 kg-90 kg] 90 kg (09/13 0500)  Intake/Output from previous day: 09/12 0701 - 09/13 0700 In: 1318.3 [P.O.:170; I.V.:250.7; IV Piggyback:897.6] Out: 300 [Urine:300]  General appearance: combative, in restraints Heart: regular rate and rhythm Lungs: diminished breath sounds bilaterally  Lab Results: Recent Labs    10/28/19 0232 10/29/19 0555  WBC 6.3 5.3  HGB 9.4* 9.2*  HCT 31.9* 30.5*  PLT 146* 159   BMET:  Recent Labs    10/28/19 0232 10/29/19 0555  NA 134* 136  K 5.1 5.2*  CL 95* 97*  CO2 33* 32  GLUCOSE 111* 105*  BUN 20 19  CREATININE 0.63 0.61  CALCIUM 8.2* 8.1*    PT/INR: No results for input(s): LABPROT, INR in the last 72 hours. ABG    Component Value Date/Time   PHART 7.341 (L) 10/28/2019 1145   HCO3 33.3 (H) 10/28/2019 1145   TCO2 30 10/26/2018 1241   O2SAT 97.3 10/28/2019 1145   CBG (last 3)  Recent Labs    10/29/19 0001 10/29/19 0403 10/29/19 0647  GLUCAP 114* 103* 145*    Assessment/Plan:  1. Hiatal Hernia 2. Pleural effusion- for IR to place drain today.. however with patient's current mental state unsure if they were perform today 3. Care per primary  Erin [name], PA-C   LOS: 5 days    Ellwood Handler 10/29/2019  Agree with above. Patient remains very confused, is currently on a Precedex drip. Satting well on nasal cannula Drain placement hopefully today. May need to discuss hiatal hernia as an outpatient.

## 2019-10-30 ENCOUNTER — Encounter (HOSPITAL_COMMUNITY): Payer: Medicare HMO

## 2019-10-30 ENCOUNTER — Other Ambulatory Visit: Payer: Self-pay

## 2019-10-30 LAB — CBC
HCT: 31.7 % — ABNORMAL LOW (ref 36.0–46.0)
Hemoglobin: 9.5 g/dL — ABNORMAL LOW (ref 12.0–15.0)
MCH: 26.5 pg (ref 26.0–34.0)
MCHC: 30 g/dL (ref 30.0–36.0)
MCV: 88.3 fL (ref 80.0–100.0)
Platelets: 190 10*3/uL (ref 150–400)
RBC: 3.59 MIL/uL — ABNORMAL LOW (ref 3.87–5.11)
RDW: 15.3 % (ref 11.5–15.5)
WBC: 7.2 10*3/uL (ref 4.0–10.5)
nRBC: 0.7 % — ABNORMAL HIGH (ref 0.0–0.2)

## 2019-10-30 LAB — BASIC METABOLIC PANEL
Anion gap: 9 (ref 5–15)
BUN: 21 mg/dL (ref 8–23)
CO2: 28 mmol/L (ref 22–32)
Calcium: 7.9 mg/dL — ABNORMAL LOW (ref 8.9–10.3)
Chloride: 93 mmol/L — ABNORMAL LOW (ref 98–111)
Creatinine, Ser: 0.54 mg/dL (ref 0.44–1.00)
GFR calc Af Amer: >60 mL/min (ref >60)
GFR calc non Af Amer: >60 mL/min (ref >60)
Glucose, Bld: 133 mg/dL — ABNORMAL HIGH (ref 70–99)
Potassium: 5.6 mmol/L — ABNORMAL HIGH (ref 3.5–5.1)
Sodium: 130 mmol/L — ABNORMAL LOW (ref 135–145)

## 2019-10-30 LAB — POCT I-STAT 7, (LYTES, BLD GAS, ICA,H+H)
Acid-Base Excess: 7 mmol/L — ABNORMAL HIGH (ref 0.0–2.0)
Bicarbonate: 33.4 mmol/L — ABNORMAL HIGH (ref 20.0–28.0)
Calcium, Ion: 1.13 mmol/L — ABNORMAL LOW (ref 1.15–1.40)
HCT: 31 % — ABNORMAL LOW (ref 36.0–46.0)
Hemoglobin: 10.5 g/dL — ABNORMAL LOW (ref 12.0–15.0)
O2 Saturation: 93 %
Patient temperature: 98.5
Potassium: 4.8 mmol/L (ref 3.5–5.1)
Sodium: 134 mmol/L — ABNORMAL LOW (ref 135–145)
TCO2: 35 mmol/L — ABNORMAL HIGH (ref 22–32)
pCO2 arterial: 54.1 mmHg — ABNORMAL HIGH (ref 32.0–48.0)
pH, Arterial: 7.399 (ref 7.350–7.450)
pO2, Arterial: 69 mmHg — ABNORMAL LOW (ref 83.0–108.0)

## 2019-10-30 LAB — GLUCOSE, CAPILLARY
Glucose-Capillary: 106 mg/dL — ABNORMAL HIGH (ref 70–99)
Glucose-Capillary: 121 mg/dL — ABNORMAL HIGH (ref 70–99)
Glucose-Capillary: 138 mg/dL — ABNORMAL HIGH (ref 70–99)

## 2019-10-30 LAB — HEPARIN LEVEL (UNFRACTIONATED)
Heparin Unfractionated: 0.18 IU/mL — ABNORMAL LOW (ref 0.30–0.70)
Heparin Unfractionated: 0.53 IU/mL (ref 0.30–0.70)

## 2019-10-30 MED ORDER — ASPIRIN EC 81 MG PO TBEC
81.0000 mg | DELAYED_RELEASE_TABLET | Freq: Every day | ORAL | Status: DC
  Administered 2019-10-30 – 2019-11-06 (×8): 81 mg via ORAL
  Filled 2019-10-30 (×8): qty 1

## 2019-10-30 MED ORDER — BISACODYL 10 MG RE SUPP: 10.0000 mg | Freq: Every day | RECTAL | Status: DC | PRN

## 2019-10-30 MED ORDER — BISACODYL 5 MG PO TBEC: 5.0000 mg | DELAYED_RELEASE_TABLET | Freq: Every day | ORAL | Status: DC | PRN

## 2019-10-30 MED ORDER — TORSEMIDE 20 MG PO TABS
20.0000 mg | ORAL_TABLET | Freq: Every day | ORAL | Status: DC
  Administered 2019-10-30 – 2019-11-06 (×8): 20 mg via ORAL
  Filled 2019-10-30 (×8): qty 1

## 2019-10-30 MED ORDER — METOPROLOL TARTRATE 25 MG PO TABS
25.0000 mg | ORAL_TABLET | Freq: Two times a day (BID) | ORAL | Status: DC
  Administered 2019-10-30 – 2019-11-06 (×14): 25 mg via ORAL
  Filled 2019-10-30 (×14): qty 1

## 2019-10-30 NOTE — Progress Notes (Signed)
Physical Therapy Treatment Patient Details Name: Emily Rocha MRN: [id number] DOB: [birth year] Today's Date: 10/30/2019    History of Present Illness 64 yo admitted to [facility] on 9/8 with SOB found to have loculated pleural effusion with progressive confusion and transferred to [facility] 9/11. Pt with metabolic encephalopathy and unable to tolerate IR place chest tube 9/13. PMhx: Right upper lobectomy due to lung CA 10/2018, severe AS, hiatal hernia, COPD, CAD    PT Comments    Pt remains with confusion, impulsivity, restlessness and decreased awareness of deficits as greatest limitations to mobility. Pt needing cues to maintain eyes open throughout session and lacks awareness of supplemental O2 as she had taken it off on arrival with report of inability to breathe with SpO2 88% on RA. 2L applied and maintained throughout with SPO2 93-100% on 2L. Will continue to follow to maximize mobility and function.     Follow Up Recommendations  Home health PT;Supervision/Assistance - 24 hour (pending cognitive improvement)     Equipment Recommendations  Rolling walker with 5" wheels    Recommendations for Other Services       Precautions / Restrictions Precautions Precautions: Fall Precaution Comments: watch sats    Mobility  Bed Mobility Overal bed mobility: Needs Assistance Bed Mobility: Supine to Sit;Sit to Supine     Supine to sit: Min guard Sit to supine: Min guard   General bed mobility comments: min guard for safety, HOB up, use of rail  Transfers Overall transfer level: Needs assistance Equipment used: 2 person hand held assist Transfers: Sit to/from [employer] Transfers Sit to Stand: +2 physical assistance;Min assist Stand pivot transfers: Min assist;+2 physical assistance       General transfer comment: stood with steadying assist, B hands held x 1 from bed, x 2 from recliner, transferred bed<>chair, returned to bed due to safety concerns remaining up in chair as pt  impulsive and trying to climb over leg rest  Ambulation/Gait             General Gait Details: unable to attempt due to cognition   Stairs             Wheelchair Mobility    Modified Rankin (Stroke Patients Only)       Balance Overall balance assessment: Needs assistance Sitting-balance support: Feet supported;No upper extremity supported Sitting balance-Leahy Scale: Good Sitting balance - Comments: with reaching towards feet to don sock   Standing balance support: During functional activity;No upper extremity supported Standing balance-Leahy Scale: Poor Standing balance comment: reliant on minimal external support                            Cognition Arousal/Alertness: Lethargic Behavior During Therapy: Restless;Impulsive Overall Cognitive Status: Impaired/Different from baseline Area of Impairment: Orientation;Attention;Memory;Following commands;Safety/judgement                 Orientation Level: Time;Situation;Place;Disoriented to Current Attention Level: Focused Memory: Decreased short-term memory Following Commands: Follows one step commands inconsistently;Follows one step commands with increased time Safety/Judgement: Decreased awareness of deficits;Decreased awareness of safety     General Comments: Appears slightly confused and hx of HOH      Exercises      General Comments        Pertinent Vitals/Pain Pain Assessment: 0-10 Pain Score: 8  Pain Location: lower abdomen Pain Descriptors / Indicators: Aching Pain Intervention(s): Monitored during session;Repositioned    Home Living Family/patient expects to be discharged to:: Private  residence Living Arrangements: Spouse/significant other Available Help at Discharge: Family;Available PRN/intermittently Type of Home: House Home Access: Stairs to enter   Home Layout: One level Home Equipment: None      Prior Function Level of Independence: Independent      Comments:  Hydrographic surveyor, drives   PT Goals (current goals can now be found in the care plan section) Acute Rehab PT Goals Patient Stated Goal: to breathe better Progress towards PT goals: Progressing toward goals    Frequency    Min 3X/week      PT Plan Discharge plan needs to be updated    Co-evaluation   Reason for Co-Treatment: For patient/therapist safety;Necessary to address cognition/behavior during functional activity   OT goals addressed during session: ADL's and self-care      AM-PAC PT "6 Clicks" Mobility   Outcome Measure  Help needed turning from your back to your side while in a flat bed without using bedrails?: None Help needed moving from lying on your back to sitting on the side of a flat bed without using bedrails?: A Little Help needed moving to and from a bed to a chair (including a wheelchair)?: A Little Help needed standing up from a chair using your arms (e.g., wheelchair or bedside chair)?: A Little Help needed to walk in hospital room?: A Lot Help needed climbing 3-5 steps with a railing? : Total 6 Click Score: 16    End of Session Equipment Utilized During Treatment: Oxygen Activity Tolerance: Patient tolerated treatment well Patient left: in bed;with call bell/phone within reach;with bed alarm set Nurse Communication: Mobility status PT Visit Diagnosis: Unsteadiness on feet (R26.81);Other abnormalities of gait and mobility (R26.89);Muscle weakness (generalized) (M62.81)     Time: 9163-8466 PT Time Calculation (min) (ACUTE ONLY): 31 min  Charges:  $Therapeutic Activity: 8-22 mins                     Jacquelinne Speak P, PT Acute Rehabilitation Services Pager: [phone] Office: [place] 10/30/2019, 1:57 PM

## 2019-10-30 NOTE — Progress Notes (Signed)
      [address]       [place] [address]             [phone]      Subjective:   Mental status improved today.  Objective: Vital signs in last 24 hours: Temp:  [97.7 F (36.5 C)] 97.7 F (36.5 C) (09/14 0332) Pulse Rate:  [58-79] 64 (09/14 0916) Cardiac Rhythm: Normal sinus rhythm (09/13 2000) Resp:  [12-33] 16 (09/14 0740) BP: (94-153)/(50-101) 112/62 (09/14 0530) SpO2:  [91 %-100 %] 98 % (09/14 0916) FiO2 (%):  [28 %] 28 % (09/14 0743) Weight:  [89.9 kg] 89.9 kg (09/14 0500)  Intake/Output from previous day: 09/13 0701 - 09/14 0700 In: 874.8 [I.V.:440.4; IV Piggyback:434.4] Out: 1000 [Urine:1000]  Alert, oriented to person Sinus Coarse breath sounds  Lab Results: Recent Labs    10/29/19 0555 10/30/19 0105  WBC 5.3 7.2  HGB 9.2* 9.5*  HCT 30.5* 31.7*  PLT 159 190   BMET:  Recent Labs    10/28/19 0232 10/29/19 0555  NA 134* 136  K 5.1 5.2*  CL 95* 97*  CO2 33* 32  GLUCOSE 111* 105*  BUN 20 19  CREATININE 0.63 0.61  CALCIUM 8.2* 8.1*    PT/INR: No results for input(s): LABPROT, INR in the last 72 hours. ABG    Component Value Date/Time   PHART 7.341 (L) 10/28/2019 1145   HCO3 33.3 (H) 10/28/2019 1145   TCO2 30 10/26/2018 1241   O2SAT 97.3 10/28/2019 1145   CBG (last 3)  Recent Labs    10/29/19 1848 10/29/19 2340 10/30/19 0617  GLUCAP 99 109* 106*    Assessment/Plan:  64 year old female with right-sided parapneumonic effusion and a moderate sized hiatal hernia.  Her mental status is much improved today.  CT-guided drain placement was unsuccessful due to patient's agitation.  Given that her mental status is improved, we will plan for a right video-assisted thoracoscopy and decortication, and possible hiatal hernia repair to prevent further aspiration.  Kunal Levario Bary Leriche

## 2019-10-30 NOTE — Progress Notes (Signed)
[place] for heparin IV Indication: Rule out ACS/ thrombus seen on CT  No Known Allergies  Patient Measurements: Height: 5\' 5"  (165.1 cm) Weight: 89.9 kg (198 lb 3.1 oz) IBW/kg (Calculated) : 57 Heparin Dosing Weight: 73 kg  Vital Signs: Temp: 97.7 F (36.5 C) (09/14 0332) Temp Source: Axillary (09/14 0332) BP: 112/62 (09/14 0530) Pulse Rate: 64 (09/14 0916)  Labs: Recent Labs    10/28/19 0232 10/28/19 1355 10/29/19 0555 10/29/19 0555 10/29/19 1246 10/30/19 0105 10/30/19 0838  HGB 9.4*  --  9.2*  --   --  9.5*  --   HCT 31.9*  --  30.5*  --   --  31.7*  --   PLT 146*  --  159  --   --  190  --   HEPARINUNFRC  --    < > <0.10*   < > 0.30 0.18* 0.53  CREATININE 0.63  --  0.61  --   --   --   --    < > = values in this interval not displayed.    Estimated Creatinine Clearance: 76.7 mL/min (by C-G formula based on SCr of 0.61 mg/dL).   Assessment: IV heparin for elevated troponins and thrombus on CT (not PE). Hgb 12.1. Ddimer 5.27 on admit.  - 9/8 CT: s/p RU lobectomy. Interval development of thrombus within the most distal portion of the residual stump of the right upper lobe branch of the right pulmonary artery. No definite pulmonary embolus is noted.  Heparin level repeat now therapeutic, CBC stable.  Goal of Therapy:  Heparin level 0.3-0.7 units/ml Monitor platelets by anticoagulation protocol: Yes   Plan:  Continue heparin 1100 units/h Daily heparin level and CBC   Arrie Senate, PharmD, BCPS Clinical Pharmacist [phone] Please check AMION for all [place] numbers 10/30/2019

## 2019-10-30 NOTE — Progress Notes (Addendum)
CSW acknowledges consult for Substance use resources. TOC team will provide resources when appropriate.

## 2019-10-30 NOTE — Patient Outreach (Signed)
[facility]) Care Management  10/30/2019  TAWAN CORKERN 1955-11-16 [id number]   Telephone Assessment Quarterly Call    Per EMR patient currently admitted to the hospital. No outreach warranted at this time.      Plan: RN CM will continue to monitor for discharge disposition.   Enzo Montgomery, RN,BSN,CCM [place] Management Telephonic Care Management Coordinator Direct Phone: [phone] Toll Free: [phone] Fax: [phone]

## 2019-10-30 NOTE — Progress Notes (Signed)
[place] for heparin IV Indication: Rule out ACS/ thrombus seen on CT  No Known Allergies  Patient Measurements: Height: 5\' 5"  (165.1 cm) Weight: 90 kg (198 lb 6.6 oz) IBW/kg (Calculated) : 57 Heparin Dosing Weight: 73 kg  Vital Signs: Temp: 97.7 F (36.5 C) (09/13 2341) Temp Source: Axillary (09/13 2341) BP: 110/63 (09/14 0100) Pulse Rate: 64 (09/14 0100)  Labs: Recent Labs    10/27/19 0613 10/27/19 1045 10/28/19 0232 10/28/19 1355 10/29/19 0555 10/29/19 1246 10/30/19 0105  HGB 10.7*  --  9.4*  --  9.2*  --  9.5*  HCT 36.2  --  31.9*  --  30.5*  --  31.7*  PLT 144*  --  146*  --  159  --  190  HEPARINUNFRC  --    < >  --    < > <0.10* 0.30 0.18*  CREATININE 0.51  --  0.63  --  0.61  --   --    < > = values in this interval not displayed.    Estimated Creatinine Clearance: 76.7 mL/min (by C-G formula based on SCr of 0.61 mg/dL).   Assessment: IV heparin for elevated troponins and thrombus on CT (not PE). Hgb 12.1. Ddimer 5.27 on admit.  - 9/8 CT: s/p RU lobectomy. Interval development of thrombus within the most distal portion of the residual stump of the right upper lobe branch of the right pulmonary artery. No definite pulmonary embolus is noted.  Heparin level down to 0.18 (subtherapeutic) on gtt at 950 units/hr. No issues with line or bleeding reported per RN.  Goal of Therapy:  Heparin level 0.3-0.7 units/ml Monitor platelets by anticoagulation protocol: Yes   Plan:  Increase heparin infusion to 1100 units/hr  F/u heparin level in 6 hours  Sherlon Handing, PharmD, BCPS Please see amion for complete clinical pharmacist phone list 10/30/2019 2:06 AM

## 2019-10-30 NOTE — Evaluation (Signed)
Occupational Therapy Evaluation Patient Details Name: Emily Rocha MRN: [id number] DOB: 21-Oct-1955 Today's Date: 10/30/2019    History of Present Illness 64 yo admitted to [facility] on 9/8 with SOB found to have loculated pleural effusion with progressive confusion and transferred to [facility] 9/11. Pt with metabolic encephalopathy and unable to tolerate IR place chest tube 9/13. PMhx: Right upper lobectomy due to lung CA 10/2018, severe AS, hiatal hernia, COPD, CAD   Clinical Impression   Pt was independent prior to admission. Presents with impaired cognition, decreased activity tolerance and impaired standing balance. She requires +2 min assist for OOB without a device, transferred only x 2. Pt with stable VS and Sp02 >92% on 2L throughout session, although she repeatedly reported feeling SOB. Pt requires set up to mod assist for ADL. Will follow acutely.    Follow Up Recommendations  Home health OT;Supervision/Assistance - 24 hour    Equipment Recommendations  None recommended by OT    Recommendations for Other Services       Precautions / Restrictions Precautions Precautions: Fall Precaution Comments: watch sats      Mobility Bed Mobility Overal bed mobility: Needs Assistance Bed Mobility: Supine to Sit;Sit to Supine     Supine to sit: Min guard Sit to supine: Min guard   General bed mobility comments: min guard for safety, HOB up, use of rail  Transfers Overall transfer level: Needs assistance Equipment used: 2 person hand held assist Transfers: Sit to/from [facility] Sit to Stand: +2 physical assistance;Min assist Stand pivot transfers: Min assist;+2 physical assistance       General transfer comment: stood with steadying assist, B hands held x 1 from bed, x 2 from recliner, transferred bed<>chair, returned to bed due to safety concerns remaining up in chair    Balance Overall balance assessment: Needs assistance Sitting-balance support: Feet supported;No  upper extremity supported Sitting balance-Leahy Scale: Good Sitting balance - Comments: with reaching towards feet to don sock     Standing balance-Leahy Scale: Poor Standing balance comment: reliant on minimal external support                           ADL either performed or assessed with clinical judgement   ADL Overall ADL's : Needs assistance/impaired Eating/Feeding: Set up;Sitting   Grooming: Wash/dry hands;Wash/dry face;Sitting;Set up Grooming Details (indicate cue type and reason): decreased task persistence Upper Body Bathing: Moderate assistance;Sitting   Lower Body Bathing: Moderate assistance;Sit to/from stand   Upper Body Dressing : Minimal assistance;Sitting   Lower Body Dressing: Moderate assistance;Sit to/from stand   Toilet Transfer: Minimal assistance;+2 for safety/equipment;Stand-pivot Toilet Transfer Details (indicate cue type and reason): simulated to chair         Functional mobility during ADLs: Minimal assistance;+2 for physical assistance;+2 for safety/equipment (transferred x 2)       Vision Baseline Vision/History: Wears glasses Wears Glasses: Reading only Patient Visual Report: No change from baseline       Perception     Praxis      Pertinent Vitals/Pain Pain Assessment: 0-10 Pain Score: 8  Pain Location: lower abdomen Pain Descriptors / Indicators: Aching Pain Intervention(s): Monitored during session;Repositioned     Hand Dominance Right   Extremity/Trunk Assessment Upper Extremity Assessment Upper Extremity Assessment: Overall WFL for tasks assessed   Lower Extremity Assessment Lower Extremity Assessment: Defer to PT evaluation   Cervical / Trunk Assessment Cervical / Trunk Assessment: Normal   Communication Communication Communication:  HOH   Cognition Arousal/Alertness: Lethargic Behavior During Therapy: Restless;Impulsive Overall Cognitive Status: Impaired/Different from baseline Area of Impairment:  Orientation;Attention;Memory;Following commands;Safety/judgement                 Orientation Level: Time;Situation;Place;Disoriented to Current Attention Level: Focused Memory: Decreased short-term memory Following Commands: Follows one step commands inconsistently;Follows one step commands with increased time Safety/Judgement: Decreased awareness of deficits;Decreased awareness of safety         General Comments       Exercises     Shoulder Instructions      Home Living Family/patient expects to be discharged to:: Private residence Living Arrangements: Spouse/significant other Available Help at Discharge: Family;Available PRN/intermittently Type of Home: House Home Access: Stairs to enter [employer] of Steps: 1   Home Layout: One level     Bathroom Shower/Tub: Occupational psychologist: Standard     Home Equipment: None          Prior Functioning/Environment Level of Independence: Independent        Comments: Hydrographic surveyor, drives        OT Problem List: Decreased strength;Decreased activity tolerance;Impaired balance (sitting and/or standing);Decreased cognition;Decreased knowledge of use of DME or AE;Obesity;Pain;Cardiopulmonary status limiting activity      OT Treatment/Interventions: Self-care/ADL training;Therapeutic activities;Patient/family education;Balance training;Cognitive remediation/compensation;DME and/or AE instruction    OT Goals(Current goals can be found in the care plan section) Acute Rehab OT Goals Patient Stated Goal: to breathe better OT Goal Formulation: With patient Time For Goal Achievement: 11/13/19 Potential to Achieve Goals: Good ADL Goals Pt Will Perform Grooming: with supervision;standing Pt Will Perform Upper Body Dressing: with supervision;sitting Pt Will Perform Lower Body Dressing: with supervision;sit to/from stand Pt Will Transfer to Toilet: with supervision;ambulating;regular height  toilet Pt Will Perform Toileting - Clothing Manipulation and hygiene: with supervision;sit to/from stand Pt Will Perform Tub/Shower Transfer: Shower transfer;with supervision;ambulating Additional ADL Goal #1: Pt will be oriented x 4 using compensatory strategies as needed. Additional ADL Goal #2: Pt will recall at least 3 energy conservation strategies as instructed.  OT Frequency: Min 2X/week   Barriers to D/C:            Co-evaluation PT/OT/SLP Co-Evaluation/Treatment: Yes Reason for Co-Treatment: For patient/therapist safety;Necessary to address cognition/behavior during functional activity   OT goals addressed during session: ADL's and self-care      AM-PAC OT "6 Clicks" Daily Activity     Outcome Measure Help from another person eating meals?: None Help from another person taking care of personal grooming?: A Little Help from another person toileting, which includes using toliet, bedpan, or urinal?: A Lot Help from another person bathing (including washing, rinsing, drying)?: A Lot Help from another person to put on and taking off regular upper body clothing?: A Lot Help from another person to put on and taking off regular lower body clothing?: A Lot 6 Click Score: 15   End of Session Equipment Utilized During Treatment: Gait belt;Oxygen (2L) Nurse Communication: Mobility status  Activity Tolerance: Patient limited by lethargy Patient left: in bed;with call bell/phone within reach;with bed alarm set  OT Visit Diagnosis: Unsteadiness on feet (R26.81);Other abnormalities of gait and mobility (R26.89);Muscle weakness (generalized) (M62.81);Cognitive communication deficit (R41.841);Pain                Time: 2130-8657 OT Time Calculation (min): 29 min Charges:  OT General Charges $OT Visit: 1 Visit OT Evaluation $OT Eval Moderate Complexity: 1 Mod  [name], OTR/L Acute Rehabilitation Services  Pager: [phone] Office: [phone]  [name] 10/30/2019, 10:37 AM

## 2019-10-30 NOTE — Consult Note (Signed)
   [facility] Inpatient Consult   10/30/2019  LEWANDA PEREA Sep 12, 1955 [id number]  [facility] [employer] [ACO] Patient: [employer]   .Patient is currently active with [facility] [place] Management for chronic disease management services.  Patient has been followed by a [facility] Management Coordinator with intermittent interaction noted and is aware of patient's hospitalization.  Plan: Will follow up with Inpatient Transition Of Care [TOC] team member to make aware that [facility] Management following. Of note, [facility] Care Management services does not replace or interfere with any services that are needed or arranged by inpatient [facility] care management team.    For questions,  please contact:   Natividad Brood, RN BSN [facility] Liaison  [phone] business mobile phone Toll free office [phone]  Fax number: [phone] [email or web address]

## 2019-10-30 NOTE — Progress Notes (Signed)
NAME:  Emily Rocha, MRN:  [id number], DOB:  10/05/1955, LOS: 6 ADMISSION DATE:  10/24/2019, CONSULTATION DATE:  9/12 REFERRING MD:  Karleen Hampshire, CHIEF COMPLAINT:  Delirium    Brief History   64 year old female with history of COPD, coronary artery disease, adenocarcinoma of the right lung requiring VATS and right upper lobe lobectomy, also newly identified hiatal hernia admitted initially with pneumonia and what appears to be complicated right pleural effusion at [facility] subsequently transferred to [facility] on 9/11 for cardiothoracic evaluation and interventional radiology drainage.  Critical care asked to evaluate the patient on 9/12 for refractory delirium, and concern about progressive hypercarbic respiratory failure.  History of present illness   This is a 64 year old female with history as mentioned below.  Admitted on 9/8 with chief complaint of delirium, and worsening respiratory distress found to have large loculated right-sided pleural effusion, she was initially admitted at [facility].  There was attempt to drain this unsuccessfully, she was therefore transferred to [facility] for thoracic surgery consultation and CT-guided chest tube drainage.  On CT evaluation with CT angiogram was also found to have interval development of thrombus in the most distal portion of the right upper lobe branch of the right pulmonary artery, was therefore started on anticoagulation also on imaging found to have large hiatal hernia.SinceShe was transferred to [facility] on 9/11, arriving at [facility] she has had worsening and progressive delirium, requiring frequent doses of Ativan, increased doses of Haldol, and direct one-on-one safety sitter as she has continued to pull out tubes and lines.  Additional evaluation has included arterial blood gas most recently showing pH of 7.34, PCO2 of 63, PO2 of 86, bicarbonate of 33.  Ammonia level 25, TSH level normal.  She has been seen by  both thoracic surgery and interventional cardiology.  Plan is for CT-guided drainage, stopping heparin drip in anticipation of this.  Plan is to place this on 9/13.  Because of worsening delirium critical care was asked to evaluate.  Past Medical History   COPD, coronary artery disease with prior CABG 2018, adenocarcinoma of the lung status post VATS with right upper lobectomy, moderate to severe aortic stenosis  Significant Hospital Events   9/9 admitted to [facility] antibiotic started, seen in consultation by [place] we recommended thoracic surgery evaluation 9/10: Developing worsening delirium felt secondary to infection, as well as hyponatremia.  Administered repeated doses of Ativan and Haldol 9/12, continues to have refractory delirium in spite of repeated doses of Ativan and Haldol.  Not tolerating BiPAP.  Pulling out IVs.  Started on precedex   Consults:  Interventional radiology Cardiothoracic surgery   Procedures:    Significant Diagnostic Tests:  CT chest 9/8: This demonstrated prior postoperative changes of the right lobe upper lobectomy, new interval development of thrombus within the most distal portion of right upper lobe pulmonary artery branch feeding to residual stump.  Moderate sized loculated right effusion with subsegmental atelectasis and what appears to be some degree of loculation moderate sized sliding-type hiatal hernia and aortic arteriosclerosis. 9/9: CT of head negative for acute findings  9/13 duplex BLE >> neg  9/11 echo >> severe AS   Micro Data:   9/8 blood culture x2>>>ng MRSA PCR positive Coronavirus/SARS: Negative Antimicrobials:  Unasyn 9/8?>>> Azithromycin 9/8>>>9/13 Vancomycin 9/9>>>   Interim history/subjective:  Remains afebrile She is delirious, remains on Precedex at 0.7 mcg/ kg/hr Remains impulsive, confused K is 5.2 Net + 2700 cc's  WBC 7.2 Day 7 antibiotics    Objective   Blood pressure 112/62, pulse 64,  temperature 97.7 F (36.5 C), temperature source Axillary, resp. rate 16, height 5\' 5"  (1.651 m), weight 89.9 kg, SpO2 98 %.    FiO2 (%):  [28 %] 28 %   Intake/Output Summary (Last 24 hours) at 10/30/2019 0934 Last data filed at 10/30/2019 0558 Gross per 24 hour  Intake 807.56 ml  Output 1000 ml  Net -192.44 ml   Filed Weights   10/28/19 2010 10/29/19 0500 10/30/19 0500  Weight: 89.5 kg 90 kg 89.9 kg    Examination: General: Elderly, on precedex, arouses to name and stimulation HENT: Dry mucosa, mild pallor, no icterus Lungs: Bilateral chest excursion, Decreased breath sounds bilaterally, no rhonchi, no accessory muscle use Cardiovascular: S1, S2, Regular rate and rhythm +  loud systolic murmur consistent with aortic stenosis Abdomen: Soft nontender, slightly distended, BS + but diminished Extremities: Edematous, multiple areas of ecchymosis pulses brisk, otherwise intact Neuro: Awake, oriented to name, knows she is at [facility], but confused and impulsive GU: Clear yellow   Labs continue to show show no leukocytosis, stable anemia Chemistry will be ordered  Resolved Hospital Problem list   No  Assessment & Plan:  Pneumonia with complicated right pleural effusion. ?  Aspiration related to hiatal hernia Plan Continue Unasyn and vancomycin ( Day 7) Unable to do CT-guided chest tube placed by IR as patient could not tolerate positioning, not a great candidate for VATS thoracotomy May need TPA  and DNase+   Agitated delirium/acute metabolic encephalopathy -multifactorial, infection, hypercarbia, hyponatremia is improved Plan Use Precedex with goal RASS 0 to -1 Decrease steroids, could be a contributing factor ? Other options of source control of effusion  Acute hypercarbic respiratory failure, PCO2 in the 60s. Has underlying history of COPD, not convinced she is actually in acute exacerbation, suspect sedating medications, underlying lung disease, and new pneumonia/complicated  right pleural effusion are the contributing factors to this Plan Sat goal > 88% Scheduled bronchodilators Solu-Medrol 20 daily BiPAP as needed Will check ABG to ensure CO2 is at her baseline and not contributing to her confusion  Acute pulmonary thrombus.  Incidentally found in the right pulmonary artery leading to old surgical stump Plan IV heparin, currently held for procedure We will likely need at least 3 to 6 months of systemic anticoagulation  Severe aortic stenosis, with chronic diastolic heart failure grade 1 Plan Telemetry Avoid tachyarrhythmias and fluid overload Home dose Demedex added 9/14  Hyponatremia Mild Hyperkalemia 9/13  - Plan Trend BMET Replete electrolytes as needed   History of coronary artery disease Plan Continuing Lopressor, aspirin, and Imdur as able  Remote history of adenocarcinoma of the lung status post right upper lobe lobectomy 2018 Plan Outpatient follow-up and surveillance   GI No BM since admission Plan Will add dulcolax for constipation   Goals of care -if agitation not adequately controlled may have to intubate her for procedure.  Discussed with the husband he would like to avoid intubation except as a last resort. As patient was unable to tolerate procedure 9/13, this may need to be considered an option   Best practice:  Diet: Clear liquids Pain/Anxiety/Delirium protocol (if indicated):  Continue Precedex drip VAP protocol (if indicated): Not indicated DVT prophylaxis: IV heparin GI prophylaxis: PPI Glucose control: sliding scale Mobility: Bedrest Code Status: Full code Family Communication: No family at bedside Disposition: ICU  Labs   CBC: Recent Labs  Lab 10/24/19 1316 10/25/19  3532 10/26/19 0534 10/27/19 9924 10/28/19 0232 10/29/19 0555 10/30/19 0105  WBC 12.4*   < > 18.6* 9.9 6.3 5.3 7.2  NEUTROABS 9.1*  --   --   --   --   --   --   HGB 12.1   < > 10.4* 10.7* 9.4* 9.2* 9.5*  HCT 39.7   < > 34.8* 36.2  31.9* 30.5* 31.7*  MCV 89.4   < > 91.6 92.3 89.9 90.8 88.3  PLT 224   < > 173 144* 146* 159 190   < > = values in this interval not displayed.    Basic Metabolic Panel: Recent Labs  Lab 10/25/19 0352 10/25/19 0352 10/26/19 0534 10/26/19 1115 10/27/19 0613 10/28/19 0232 10/28/19 1355 10/29/19 0555  NA 129*  --   --  130* 132* 134*  --  136  K 5.7*  --   --  5.4* 5.6* 5.1  --  5.2*  CL 92*  --   --  93* 93* 95*  --  97*  CO2 27  --   --  28 29 33*  --  32  GLUCOSE 154*  --   --  147* 154* 111*  --  105*  BUN 33*  --   --  30* 27* 20  --  19  CREATININE 0.70   < > 0.69 0.58 0.51 0.63  --  0.61  CALCIUM 8.4*  --   --  7.9* 8.2* 8.2*  --  8.1*  MG  --   --   --   --   --   --  2.9*  --    < > = values in this interval not displayed.   GFR: Estimated Creatinine Clearance: 76.7 mL/min (by C-G formula based on SCr of 0.61 mg/dL). Recent Labs  Lab 10/24/19 1316 10/24/19 1357 10/24/19 1655 10/25/19 0352 10/27/19 2683 10/28/19 0232 10/29/19 0555 10/30/19 0105  PROCALCITON 0.15  --   --   --   --   --   --   --   WBC 12.4*  --   --    < > 9.9 6.3 5.3 7.2  LATICACIDVEN  --  1.2 1.1  --   --   --   --   --    < > = values in this interval not displayed.    Liver Function Tests: Recent Labs  Lab 10/24/19 1316 10/25/19 0352 10/26/19 1115 10/27/19 0613 10/28/19 0232  AST 46* 33 25 23 22   ALT 145* 123* 83* 68* 60*  ALKPHOS 114 108 86 78 71  BILITOT 1.1 0.8 0.8 1.0 1.0  PROT 6.7 7.2 6.5 6.5 6.1*  ALBUMIN 3.6 3.7 3.4* 3.4* 3.3*   No results for input(s): LIPASE, AMYLASE in the last 168 hours. Recent Labs  Lab 10/28/19 1355  AMMONIA 25    ABG    Component Value Date/Time   PHART 7.341 (L) 10/28/2019 1145   PCO2ART 63.2 (H) 10/28/2019 1145   PO2ART 86.4 10/28/2019 1145   HCO3 33.3 (H) 10/28/2019 1145   TCO2 30 10/26/2018 1241   O2SAT 97.3 10/28/2019 1145     PCCM APP Time :30 minutes  Magdalen Spatz, MSN, AGACNP-BC [facility] for personal pager PCCM on call pager [phone] If no response to pager , please call [phone]  After 7:00 pm call Elink  [phone]   10/30/2019 9:34 AM

## 2019-10-31 ENCOUNTER — Ambulatory Visit: Payer: Self-pay

## 2019-10-31 ENCOUNTER — Inpatient Hospital Stay (HOSPITAL_COMMUNITY): Payer: Medicare HMO

## 2019-10-31 LAB — COMPREHENSIVE METABOLIC PANEL
ALT: 33 U/L (ref 0–44)
AST: 22 U/L (ref 15–41)
Albumin: 3 g/dL — ABNORMAL LOW (ref 3.5–5.0)
Alkaline Phosphatase: 50 U/L (ref 38–126)
Anion gap: 9 (ref 5–15)
BUN: 16 mg/dL (ref 8–23)
CO2: 32 mmol/L (ref 22–32)
Calcium: 8 mg/dL — ABNORMAL LOW (ref 8.9–10.3)
Chloride: 94 mmol/L — ABNORMAL LOW (ref 98–111)
Creatinine, Ser: 0.6 mg/dL (ref 0.44–1.00)
GFR calc Af Amer: >60 mL/min (ref >60)
GFR calc non Af Amer: >60 mL/min (ref >60)
Glucose, Bld: 110 mg/dL — ABNORMAL HIGH (ref 70–99)
Potassium: 4.3 mmol/L (ref 3.5–5.1)
Sodium: 135 mmol/L (ref 135–145)
Total Bilirubin: 1.9 mg/dL — ABNORMAL HIGH (ref 0.3–1.2)
Total Protein: 5.3 g/dL — ABNORMAL LOW (ref 6.5–8.1)

## 2019-10-31 LAB — CBC
HCT: 34.2 % — ABNORMAL LOW (ref 36.0–46.0)
Hemoglobin: 10.5 g/dL — ABNORMAL LOW (ref 12.0–15.0)
MCH: 27 pg (ref 26.0–34.0)
MCHC: 30.7 g/dL (ref 30.0–36.0)
MCV: 87.9 fL (ref 80.0–100.0)
Platelets: 154 10*3/uL (ref 150–400)
RBC: 3.89 MIL/uL (ref 3.87–5.11)
RDW: 15.3 % (ref 11.5–15.5)
WBC: 8 10*3/uL (ref 4.0–10.5)
nRBC: 0 % (ref 0.0–0.2)

## 2019-10-31 LAB — MAGNESIUM: Magnesium: 2.1 mg/dL (ref 1.7–2.4)

## 2019-10-31 LAB — GLUCOSE, CAPILLARY
Glucose-Capillary: 142 mg/dL — ABNORMAL HIGH (ref 70–99)
Glucose-Capillary: 95 mg/dL (ref 70–99)

## 2019-10-31 LAB — HEPARIN LEVEL (UNFRACTIONATED): Heparin Unfractionated: 0.54 IU/mL (ref 0.30–0.70)

## 2019-10-31 LAB — AMMONIA: Ammonia: 58 umol/L — ABNORMAL HIGH (ref 9–35)

## 2019-10-31 MED ORDER — ALPRAZOLAM 0.25 MG PO TABS
0.2500 mg | ORAL_TABLET | Freq: Three times a day (TID) | ORAL | Status: DC
  Administered 2019-10-31 – 2019-11-06 (×18): 0.25 mg via ORAL
  Filled 2019-10-31 (×18): qty 1

## 2019-10-31 MED ORDER — FUROSEMIDE 10 MG/ML IJ SOLN
40.0000 mg | Freq: Once | INTRAMUSCULAR | Status: AC
  Administered 2019-10-31: 40 mg via INTRAVENOUS
  Filled 2019-10-31: qty 4

## 2019-10-31 NOTE — Progress Notes (Signed)
[facility] Progress Note Patient Name: Emily Rocha DOB: 1955-08-21 MRN: [id number]   Date of Service  10/31/2019  HPI/Events of Note  Pt still agitated/delirious despite Precedex at 0.7 and PRN haldol Bedside RN requesting increase in Precedex gtt  Camera: Obese, now on nasal o2. Has BiPAP order also.  VS stable.  Discussed with bed side RN   eICU Interventions  - increased precedex gtt to max 1.2 as tolerated. Asp precautions Watch for brady/hypotension.       Intervention Category Intermediate Interventions: Other:  Elmer Sow 10/31/2019, 2:02 AM

## 2019-10-31 NOTE — Progress Notes (Signed)
      [address]       [place] [address]             [phone]    Emily Rocha is much more lucid today. She states that she would like to go home. We will need to discuss this with her husband given the fact that she still has a loculated right-sided effusion is requiring high supplemental oxygen.  Renlee Floor Bary Leriche

## 2019-10-31 NOTE — Progress Notes (Signed)
[place] for heparin IV Indication: Rule out ACS/ thrombus seen on CT  No Known Allergies  Patient Measurements: Height: 5\' 5"  (165.1 cm) Weight: 89.1 kg (196 lb 6.9 oz) IBW/kg (Calculated) : 57 Heparin Dosing Weight: 73 kg  Vital Signs: Temp: 98.4 F (36.9 C) (09/15 0754) Temp Source: Oral (09/15 0754) BP: 115/68 (09/15 0958) Pulse Rate: 78 (09/15 0946)  Labs: Recent Labs    10/29/19 0555 10/29/19 1246 10/30/19 0105 10/30/19 0105 10/30/19 7915 10/30/19 1155 10/30/19 1218 10/31/19 0605 10/31/19 0611  HGB 9.2*  --  9.5*   < >  --   --  10.5* 10.5*  --   HCT 30.5*  --  31.7*  --   --   --  31.0* 34.2*  --   PLT 159  --  190  --   --   --   --  154  --   HEPARINUNFRC <0.10*   < > 0.18*  --  0.53  --   --   --  0.54  CREATININE 0.61  --   --   --   --  0.54  --  0.60  --    < > = values in this interval not displayed.    Estimated Creatinine Clearance: 76.2 mL/min (by C-G formula based on SCr of 0.6 mg/dL).   Assessment: IV heparin for elevated troponins and thrombus on CT (not PE). Hgb 12.1. Ddimer 5.27 on admit.  - 9/8 CT: s/p RU lobectomy. Interval development of thrombus within the most distal portion of the residual stump of the right upper lobe branch of the right pulmonary artery. No definite pulmonary embolus is noted.  Heparin level repeat now therapeutic at 0.54, CBC stable.  Goal of Therapy:  Heparin level 0.3-0.7 units/ml Monitor platelets by anticoagulation protocol: Yes   Plan:  Continue heparin 1100 units/h Daily heparin level and CBC   Arrie Senate, PharmD, BCPS Clinical Pharmacist [phone] Please check AMION for all [place] numbers 10/31/2019

## 2019-10-31 NOTE — Progress Notes (Signed)
NAME:  Emily Rocha, MRN:  [id number], DOB:  02/01/1956, LOS: 7 ADMISSION DATE:  10/24/2019, CONSULTATION DATE:  9/12 REFERRING MD:  Karleen Hampshire, CHIEF COMPLAINT:  Delirium    Brief History   64 year old female with history of COPD, coronary artery disease, adenocarcinoma of the right lung requiring VATS and right upper lobe lobectomy, also newly identified hiatal hernia admitted initially with pneumonia and what appears to be complicated right pleural effusion at [facility] subsequently transferred to [facility] on 9/11 for cardiothoracic evaluation and interventional radiology drainage.  Critical care asked to evaluate the patient on 9/12 for refractory delirium, and concern about progressive hypercarbic respiratory failure.  History of present illness   This is a 64 year old female with history as mentioned below.  Admitted on 9/8 with chief complaint of delirium, and worsening respiratory distress found to have large loculated right-sided pleural effusion, she was initially admitted at [facility].  There was attempt to drain this unsuccessfully, she was therefore transferred to [facility] for thoracic surgery consultation and CT-guided chest tube drainage.  On CT evaluation with CT angiogram was also found to have interval development of thrombus in the most distal portion of the right upper lobe branch of the right pulmonary artery, was therefore started on anticoagulation also on imaging found to have large hiatal hernia.SinceShe was transferred to [facility] on 9/11, arriving at [facility] she has had worsening and progressive delirium, requiring frequent doses of Ativan, increased doses of Haldol, and direct one-on-one safety sitter as she has continued to pull out tubes and lines.  Additional evaluation has included arterial blood gas most recently showing pH of 7.34, PCO2 of 63, PO2 of 86, bicarbonate of 33.  Ammonia level 25, TSH level normal.  She has been seen by  both thoracic surgery and interventional cardiology.  Plan is for CT-guided drainage, stopping heparin drip in anticipation of this.  Plan is to place this on 9/13.  Because of worsening delirium critical care was asked to evaluate.  Past Medical History   COPD, coronary artery disease with prior CABG 2018, adenocarcinoma of the lung status post VATS with right upper lobectomy, moderate to severe aortic stenosis  Significant Hospital Events   9/9 admitted to [facility] antibiotic started, seen in consultation by [place] we recommended thoracic surgery evaluation 9/10: Developing worsening delirium felt secondary to infection, as well as hyponatremia.  Administered repeated doses of Ativan and Haldol 9/12, continues to have refractory delirium in spite of repeated doses of Ativan and Haldol.  Not tolerating BiPAP.  Pulling out IVs.  Started on precedex   Consults:  Interventional radiology Cardiothoracic surgery   Procedures:    Significant Diagnostic Tests:  CT chest 9/8: This demonstrated prior postoperative changes of the right lobe upper lobectomy, new interval development of thrombus within the most distal portion of right upper lobe pulmonary artery branch feeding to residual stump.  Moderate sized loculated right effusion with subsegmental atelectasis and what appears to be some degree of loculation moderate sized sliding-type hiatal hernia and aortic arteriosclerosis. 9/9: CT of head negative for acute findings  9/13 duplex BLE >> neg  9/11 echo >> severe AS   Micro Data:   9/8 blood culture x2>>>ng MRSA PCR positive Coronavirus/SARS: Negative Antimicrobials:  Unasyn 9/8 >> Azithromycin 9/8>>>9/13 Vancomycin 9/9>>>  Interim history/subjective:  Tmax 98.4/ WBC 8 Remains on precedex  Net + 2100  Objective   Blood pressure 115/68, pulse 78, temperature 98.4 F (  36.9 C), temperature source Oral, resp. rate 17, height 5\' 5"  (1.651 m), weight 89.1 kg, SpO2 100 %.     FiO2 (%):  [28 %] 28 %   Intake/Output Summary (Last 24 hours) at 10/31/2019 1126 Last data filed at 10/31/2019 0800 Gross per 24 hour  Intake 1805.37 ml  Output 2500 ml  Net -694.63 ml   Filed Weights   10/29/19 0500 10/30/19 0500 10/31/19 0443  Weight: 90 kg 89.9 kg 89.1 kg    Examination: General:  Elderly female sitting upright in bed in NAD HEENT: MM pink/moist, pupils 3/reactive Neuro: Alert/ oriented, non focal  CV: rr, no murmur PULM:  Non labored, speaks full sentences, on 2L [place] with sats ~97-98, diffuse insp/ exp wheeze, diminished in bases GI: obese, soft, NT/ ND, +bs, purwick  Extremities: warm/dry, trace LE edema, ecchymosis to BUEs Skin: no rashes  Resolved Hospital Problem list    Assessment & Plan:  Right complex/ loculated pleural effusion  Acute hypoxic respiratory failure ?  Aspiration related to hiatal hernia Hx of COPD, possible AE - failed attempts at CT guided intervention 2/2 encephalopathy  - plans by TCTS for right VATS, unsure of timing - Continue supplemental O2 for sat goal  >88%, currently on 2L [place], BiPAP if needed  - Continue pulmonary hygiene efforts  - continue abx,  unasyn/ vanc day 8/x - continue BDs, duonebs QID, prn albuterol, and dulera  - continue solumedrol to 20 mg BID (hold on taper given ongoing wheezing)  Agitated delirium/acute metabolic encephalopathy -multifactorial, infection, hypercarbia, hyponatremia - improving - CTH 9/9 negative - continue Precedex with goal RASS 0 to -1 - steroids reduced 9/14 - encourage lights on during day/ frequent reorientation (patient prefers lights off which may be contributing to delirum - continue neuro checks  - question element of benzo withdrawal, on chronic home benzo.  Will add back xanax reduced dose with goal of weaning precedex off.   Acute pulmonary thrombus.  Incidentally found in the right pulmonary artery leading to old surgical stump - IV heparin per pharmacy  - We will likely  need at least 3 to 6 months of systemic anticoagulation  Severe aortic stenosis, with chronic diastolic heart failure grade 1 - continued telemetry monitoring  - goal euvolemia, dose of lasix 40 mg x1 - Continue home demadex   Hyponatremia- resolved Mild Hyperkalemia 9/13- resolved - Trend BMET  History of coronary artery disease - Continue Lopressor, aspirin, and Imdur   Remote history of adenocarcinoma of the lung status post right upper lobe lobectomy 2018 - outpatient follow-up and surveillance   GI No BM since admission - bowel regimen in place  Protein calorie malnutrition  -  Maximize nutrition when able, currently clear liquids   Best practice:  Diet: Clear liquids Pain/Anxiety/Delirium protocol (if indicated):  Continue Precedex drip VAP protocol (if indicated): Not indicated DVT prophylaxis: IV heparin GI prophylaxis: PPI Glucose control: trend on BMP Mobility: Bedrest Code Status: Full code Family Communication: patient and husband updated Disposition: ICU  Labs   CBC: Recent Labs  Lab 10/24/19 1316 10/25/19 0352 10/27/19 1443 10/27/19 1540 10/28/19 0232 10/29/19 0555 10/30/19 0105 10/30/19 1218 10/31/19 0605  WBC 12.4*   < > 9.9  --  6.3 5.3 7.2  --  8.0  NEUTROABS 9.1*  --   --   --   --   --   --   --   --   HGB 12.1   < > 10.7*   < >  9.4* 9.2* 9.5* 10.5* 10.5*  HCT 39.7   < > 36.2   < > 31.9* 30.5* 31.7* 31.0* 34.2*  MCV 89.4   < > 92.3  --  89.9 90.8 88.3  --  87.9  PLT 224   < > 144*  --  146* 159 190  --  154   < > = values in this interval not displayed.    Basic Metabolic Panel: Recent Labs  Lab 10/27/19 0613 10/27/19 1540 10/28/19 0232 10/28/19 1355 10/29/19 0555 10/30/19 1155 10/30/19 1218 10/31/19 0605  NA 132*   < > 134*  --  136 130* 134* 135  K 5.6*   < > 5.1  --  5.2* 5.6* 4.8 4.3  CL 93*  --  95*  --  97* 93*  --  94*  CO2 29  --  33*  --  32 28  --  32  GLUCOSE 154*  --  111*  --  105* 133*  --  110*  BUN 27*  --   20  --  19 21  --  16  CREATININE 0.51  --  0.63  --  0.61 0.54  --  0.60  CALCIUM 8.2*  --  8.2*  --  8.1* 7.9*  --  8.0*  MG  --   --   --  2.9*  --   --   --  2.1   < > = values in this interval not displayed.   GFR: Estimated Creatinine Clearance: 76.2 mL/min (by C-G formula based on SCr of 0.6 mg/dL). Recent Labs  Lab 10/24/19 1316 10/24/19 1357 10/24/19 1655 10/25/19 0352 10/28/19 0232 10/29/19 0555 10/30/19 0105 10/31/19 0605  PROCALCITON 0.15  --   --   --   --   --   --   --   WBC 12.4*  --   --    < > 6.3 5.3 7.2 8.0  LATICACIDVEN  --  1.2 1.1  --   --   --   --   --    < > = values in this interval not displayed.    Liver Function Tests: Recent Labs  Lab 10/25/19 0352 10/26/19 1115 10/27/19 0613 10/28/19 0232 10/31/19 0605  AST 33 25 23 22 22   ALT 123* 83* 68* 60* 33  ALKPHOS 108 86 78 71 50  BILITOT 0.8 0.8 1.0 1.0 1.9*  PROT 7.2 6.5 6.5 6.1* 5.3*  ALBUMIN 3.7 3.4* 3.4* 3.3* 3.0*   No results for input(s): LIPASE, AMYLASE in the last 168 hours. Recent Labs  Lab 10/28/19 1355 10/31/19 0611  AMMONIA 25 58*    ABG    Component Value Date/Time   PHART 7.399 10/30/2019 1218   PCO2ART 54.1 (H) 10/30/2019 1218   PO2ART 69 (L) 10/30/2019 1218   HCO3 33.4 (H) 10/30/2019 1218   TCO2 35 (H) 10/30/2019 1218   O2SAT 93.0 10/30/2019 1218      CCT: 30 mins  Kennieth Rad, MSN, AGACNP-BC [facility] 10/31/2019, 11:27 AM  See Shea Evans for personal pager PCCM on call pager [phone]

## 2019-11-01 ENCOUNTER — Encounter (HOSPITAL_COMMUNITY): Payer: Self-pay | Admitting: Internal Medicine

## 2019-11-01 ENCOUNTER — Encounter (HOSPITAL_COMMUNITY): Payer: Medicare HMO

## 2019-11-01 LAB — CBC
HCT: 34.4 % — ABNORMAL LOW (ref 36.0–46.0)
Hemoglobin: 10.6 g/dL — ABNORMAL LOW (ref 12.0–15.0)
MCH: 26.8 pg (ref 26.0–34.0)
MCHC: 30.8 g/dL (ref 30.0–36.0)
MCV: 87.1 fL (ref 80.0–100.0)
Platelets: 208 10*3/uL (ref 150–400)
RBC: 3.95 MIL/uL (ref 3.87–5.11)
RDW: 15.2 % (ref 11.5–15.5)
WBC: 8.2 10*3/uL (ref 4.0–10.5)
nRBC: 0.2 % (ref 0.0–0.2)

## 2019-11-01 LAB — RENAL FUNCTION PANEL
Albumin: 3 g/dL — ABNORMAL LOW (ref 3.5–5.0)
Anion gap: 12 (ref 5–15)
BUN: 13 mg/dL (ref 8–23)
CO2: 35 mmol/L — ABNORMAL HIGH (ref 22–32)
Calcium: 8.3 mg/dL — ABNORMAL LOW (ref 8.9–10.3)
Chloride: 89 mmol/L — ABNORMAL LOW (ref 98–111)
Creatinine, Ser: 0.52 mg/dL (ref 0.44–1.00)
GFR calc Af Amer: >60 mL/min (ref >60)
GFR calc non Af Amer: >60 mL/min (ref >60)
Glucose, Bld: 92 mg/dL (ref 70–99)
Phosphorus: 3.1 mg/dL (ref 2.5–4.6)
Potassium: 4 mmol/L (ref 3.5–5.1)
Sodium: 136 mmol/L (ref 135–145)

## 2019-11-01 LAB — HEPARIN LEVEL (UNFRACTIONATED): Heparin Unfractionated: 0.68 IU/mL (ref 0.30–0.70)

## 2019-11-01 LAB — GLUCOSE, CAPILLARY
Glucose-Capillary: 126 mg/dL — ABNORMAL HIGH (ref 70–99)
Glucose-Capillary: 147 mg/dL — ABNORMAL HIGH (ref 70–99)
Glucose-Capillary: 204 mg/dL — ABNORMAL HIGH (ref 70–99)
Glucose-Capillary: 95 mg/dL (ref 70–99)

## 2019-11-01 MED ORDER — HYDROMORPHONE HCL 1 MG/ML IJ SOLN
INTRAMUSCULAR | Status: AC
  Filled 2019-11-01: qty 1

## 2019-11-01 MED ORDER — METHYLPREDNISOLONE SODIUM SUCC 40 MG IJ SOLR
20.0000 mg | Freq: Every day | INTRAMUSCULAR | Status: DC
  Administered 2019-11-02 – 2019-11-03 (×2): 20 mg via INTRAVENOUS
  Filled 2019-11-01 (×2): qty 1

## 2019-11-01 MED ORDER — HYDROMORPHONE HCL 1 MG/ML IJ SOLN
INTRAMUSCULAR | Status: AC
Start: 2019-11-01 — End: 2019-11-02
  Filled 2019-11-01: qty 0.5

## 2019-11-01 MED ORDER — INFLUENZA VAC A&B SA ADJ QUAD 0.5 ML IM PRSY
0.5000 mL | PREFILLED_SYRINGE | INTRAMUSCULAR | Status: DC
  Filled 2019-11-01: qty 0.5

## 2019-11-01 NOTE — Progress Notes (Signed)
Occupational Therapy Treatment Patient Details Name: Emily Rocha MRN: [id number] DOB: [birth year] Today's Date: 11/01/2019    History of present illness Pt is 64 yo admitted to [facility] on 9/8 with SOB found to have loculated pleural effusion with progressive confusion and transferred to [facility] 9/11. Pt with metabolic encephalopathy and unable to tolerate IR place chest tube 9/13. PMhx: Right upper lobectomy due to lung CA 10/2018, severe AS, hiatal hernia, COPD, CAD   OT comments  Pt with improved cognition and tolerance for activity, although remains impulsive with decreased awareness of safety. Requiring 1L 02 at rest and 2L with ambulation to maintain sats >90%. Pt pleased to be able to ambulate in halls with RW. Min assist needed for toileting, standing grooming and UB dressing.  Pt reports she has 24 hour care at home of her husband, paid caregivers and church friends. Will continue to follow.  Follow Up Recommendations  Home health OT;Supervision/Assistance - 24 hour    Equipment Recommendations  None recommended by OT    Recommendations for Other Services      Precautions / Restrictions Precautions Precautions: Fall Precaution Comments: watch sats       Mobility Bed Mobility Overal bed mobility: Needs Assistance Bed Mobility: Supine to Sit     Supine to sit: Min guard     General bed mobility comments: min guard for safety, HOB up  Transfers Overall transfer level: Needs assistance Equipment used: Rolling walker (2 wheeled) Transfers: Sit to/from Stand Sit to Stand: Min assist         General transfer comment: min assist to rise, cues for hand placement    Balance Overall balance assessment: Needs assistance Sitting-balance support: Feet supported;No upper extremity supported Sitting balance-Leahy Scale: Good     Standing balance support: During functional activity;No upper extremity supported Standing balance-Leahy Scale: Fair Standing balance comment:  statically for hand washing at sink                           ADL either performed or assessed with clinical judgement   ADL Overall ADL's : Needs assistance/impaired     Grooming: Wash/dry hands;Standing;Supervision/safety Grooming Details (indicate cue type and reason): decreased task persistence         Upper Body Dressing : Minimal assistance;Sitting Upper Body Dressing Details (indicate cue type and reason): front opening gown     Toilet Transfer: Ambulation;RW;Minimal assistance   Toileting- Clothing Manipulation and Hygiene: Minimal assistance;Sit to/from stand       Functional mobility during ADLs: Passenger transport manager     Praxis      Cognition Arousal/Alertness: Awake/alert Behavior During Therapy: Impulsive Overall Cognitive Status: Impaired/Different from baseline Area of Impairment: Safety/judgement                         Safety/Judgement: Decreased awareness of deficits;Decreased awareness of safety     General Comments: Pt with mild impulsivity requiring min cues for lines/lead and taking her time.  Significantly better than prior visits        Exercises     Shoulder Instructions       General Comments Pt was on 1 LPM O2 at rest with sats 96%.  Dropped to 86% with walking Rocha increased to 2 LPM then maintained 91% at lowest.  Placed back on 1 LPM at rest with sats >  90%.    Pertinent Vitals/ Pain       Pain Assessment: No/denies pain Faces Pain Scale: Hurts a little bit Pain Location: back Pain Descriptors / Indicators: Aching Pain Intervention(s): Limited activity within patient's tolerance;Monitored during session;Repositioned ("felt better OOB")  Home Living Family/patient expects to be discharged to:: Private residence Living Arrangements: Spouse/significant other                                      Prior Functioning/Environment              Frequency   Min 2X/week        Progress Toward Goals  OT Goals(current goals can now be found in the care plan section)  Progress towards OT goals: Progressing toward goals  Acute Rehab OT Goals Patient Stated Goal: to breathe better and go home OT Goal Formulation: With patient Time For Goal Achievement: 11/13/19 Potential to Achieve Goals: Good  Plan Discharge plan remains appropriate    Co-evaluation    PT/OT/SLP Co-Evaluation/Treatment: Yes Reason for Co-Treatment: Complexity of the patient's impairments (multi-system involvement);Necessary to address cognition/behavior during functional activity   OT goals addressed during session: ADL's and self-care;Proper use of Adaptive equipment and DME      AM-PAC OT "6 Clicks" Daily Activity     Outcome Measure   Help from another person eating meals?: None Help from another person taking care of personal grooming?: A Little Help from another person toileting, which includes using toliet, bedpan, or urinal?: A Little Help from another person bathing (including washing, rinsing, drying)?: A Little Help from another person to put on and taking off regular upper body clothing?: A Little Help from another person to put on and taking off regular lower body clothing?: A Little 6 Click Score: 19    End of Session Equipment Utilized During Treatment: Gait belt;Rolling walker;Oxygen (1-2L)  OT Visit Diagnosis: Unsteadiness on feet (R26.81);Other abnormalities of gait and mobility (R26.89);Muscle weakness (generalized) (M62.81);Cognitive communication deficit (R41.841);Pain   Activity Tolerance Patient tolerated treatment well   Patient Left in chair;with call bell/phone within reach;with chair alarm set   Nurse Communication Mobility status        Time: 3419-3790 OT Time Calculation (min): 26 min  Charges: OT General Charges $OT Visit: 1 Visit OT Treatments $Therapeutic Activity: 8-22 mins  [name], OTR/L Acute  Rehabilitation Services Pager: [phone] Office: [phone]   Emily Rocha 11/01/2019, 1:28 PM

## 2019-11-01 NOTE — Progress Notes (Signed)
ANTICOAGULATION CONSULT NOTE   Pharmacy Consult for heparin IV Indication: Rule out ACS/ thrombus seen on CT  No Known Allergies  Patient Measurements: Height: 5\' 5"  (165.1 cm) Weight: 85.5 kg (188 lb 7.9 oz) IBW/kg (Calculated) : 57 Heparin Dosing Weight: 73 kg  Vital Signs: Temp: 98.3 F (36.8 C) (09/16 1213) Temp Source: Oral (09/16 0731) BP: 97/80 (09/16 1400) Pulse Rate: 86 (09/16 1400)  Labs: Recent Labs    10/30/19 0105 10/30/19 0105 10/30/19 0838 10/30/19 1155 10/30/19 1218 10/30/19 1218 10/31/19 0605 10/31/19 0611 11/01/19 0241  HGB 9.5*   < >  --   --  10.5*   < > 10.5*  --  10.6*  HCT 31.7*   < >  --   --  31.0*  --  34.2*  --  34.4*  PLT 190  --   --   --   --   --  154  --  208  HEPARINUNFRC 0.18*   < > 0.53  --   --   --   --  0.54 0.68  CREATININE  --   --   --  0.54  --   --  0.60  --  0.52   < > = values in this interval not displayed.    Estimated Creatinine Clearance: 74.7 mL/min (by C-G formula based on SCr of 0.52 mg/dL).   Assessment: IV heparin for elevated troponins and thrombus on CT (not PE). Hgb 12.1. Ddimer 5.27 on admit.  - 9/8 CT: s/p RU lobectomy. Interval development of thrombus within the most distal portion of the residual stump of the right upper lobe branch of the right pulmonary artery. No definite pulmonary embolus is noted.  Heparin level therapeutic at 0.68, CBC stable.  Goal of Therapy:  Heparin level 0.3-0.7 units/ml Monitor platelets by anticoagulation protocol: Yes   Plan:  Continue heparin 1100 units/h Daily heparin level and CBC   Norina Buzzard, PharmD PGY1 Pharmacy Resident 11/01/2019 3:08 PM

## 2019-11-01 NOTE — Progress Notes (Signed)
TCTS Evening Rounds  Stable night/hemodynamics Resting comfortably. Continue present management. Emily Prima Z. Orvan Seen, [place]

## 2019-11-01 NOTE — Progress Notes (Signed)
Referring Physician(s): Dr. Melodie Bouillon  Supervising Physician: Corrie Mckusick  Patient Status:  [facility] - In-pt  Chief Complaint: Right loculated pleural effusion  Subjective: Patient resting comfortably in room.  On 1L [place].  Alert, conversant, oriented x4.   Allergies: Patient has no known allergies.  Medications: Prior to Admission medications   Medication Sig Start Date End Date Taking? Authorizing Provider  albuterol (PROVENTIL) (2.5 MG/3ML) 0.083% nebulizer solution Take 2.5 mg by nebulization every 4 (four) hours as needed. 03/30/19  Yes [provider]  ALPRAZolam Duanne Moron) 0.5 MG tablet Take 0.5 mg by mouth 3 (three) times daily.    Yes [provider]  Ascorbic Acid (VITAMIN C GUMMIE) 120 MG CHEW Chew 500 mg by mouth.   Yes [provider]  aspirin 81 MG chewable tablet Chew 81 mg by mouth daily.   Yes [provider]  atorvastatin (LIPITOR) 40 MG tablet Take 1 tablet (40 mg total) daily by mouth. 12/27/16  Yes Gold, Wayne E, PA-C  calcium carbonate (OS-CAL) 1250 (500 Ca) MG chewable tablet Chew 1 tablet by mouth daily.   Yes [provider]  cholecalciferol (VITAMIN D3) 25 MCG (1000 UNIT) tablet Take 1,000 Units by mouth daily.   Yes [provider]  diphenhydramine-acetaminophen (TYLENOL PM) 25-500 MG TABS tablet Take 2-3 tablets by mouth at bedtime.   Yes [provider]  isosorbide mononitrate (IMDUR) 30 MG 24 hr tablet Take 0.5 tablets (15 mg total) by mouth daily. 03/21/19  Yes BranchAlphonse Guild, MD  metoprolol succinate (TOPROL-XL) 50 MG 24 hr tablet Take 50 mg by mouth daily. Take with or immediately following a meal.   Yes [provider]  Multiple Vitamin (MULTIVITAMIN WITH MINERALS) TABS tablet Take 1 tablet by mouth daily. 12/01/18  Yes Lars Pinks M, PA-C  pantoprazole (PROTONIX) 40 MG tablet Take 40 mg by mouth daily.   Yes [provider]  potassium chloride SA  (K-DUR,KLOR-CON) 20 MEQ tablet Take 20 mEq by mouth daily.  10/20/17  Yes [provider]  Tiotropium Bromide-Olodaterol (STIOLTO RESPIMAT) 2.5-2.5 MCG/ACT AERS Inhale 2 puffs into the lungs daily. 07/19/19  Yes Mannam, Praveen, MD  torsemide (DEMADEX) 20 MG tablet Take 1 tablet (20 mg total) by mouth daily. 11/01/18  Yes Lars Pinks M, PA-C     Vital Signs: BP 97/80   Pulse 86   Temp 98.3 F (36.8 C)   Resp 19   Ht 5\' 5"  (1.651 m)   Wt 188 lb 7.9 oz (85.5 kg)   SpO2 93%   BMI 31.37 kg/m   Physical Exam  NAD, alert, oriented x4 Heart: RRR Chest: no increased work of breathing, no wheezes Skin: bruising to hands and arms noted  Imaging: DG CHEST PORT 1 VIEW  Result Date: 10/31/2019 CLINICAL DATA:  Shortness of breath EXAM: PORTABLE CHEST 1 VIEW COMPARISON:  October 29, 2019 chest radiograph and chest CT October 24, 2019 FINDINGS: There is a persistent fairly small right pleural effusion. There is ill-defined airspace opacity in the right base with apparent postoperative change in this area. Elsewhere, there is mild interstitial edema. There is cardiomegaly with pulmonary venous hypertension. Patient is status post internal mammary bypass grafting. No adenopathy. There is calcification in the aortic arch as well as in the left carotid artery. IMPRESSION: Cardiomegaly with pulmonary vascular congestion. Interstitial pulmonary edema with right pleural effusion. These are findings felt to be indicative of a degree of underlying congestive heart failure. There is superimposed airspace  opacity in the right base which may represent alveolar edema or a degree of superimposed pneumonia. Postoperative changes noted. There is left carotid artery calcification. Aortic Atherosclerosis (ICD10-I70.0). Electronically Signed   By: Lowella Grip III M.D.   On: 10/31/2019 08:11   DG CHEST PORT 1 VIEW  Result Date: 10/29/2019 CLINICAL DATA:  Shortness of breath, respiratory failure,  pleural effusion EXAM: PORTABLE CHEST 1 VIEW COMPARISON:  CTA chest dated 10/24/2019 FINDINGS: Small to moderate right pleural effusion, possibly mildly improved. Associated right lower lobe opacity, likely atelectasis. Postsurgical changes in the right upper hemithorax. Left lung is clear. No frank interstitial edema. Cardiomegaly. Postsurgical changes related to prior CABG. Median sternotomy. IMPRESSION: Small to moderate right pleural effusion, possibly mildly improved. Postsurgical changes in the right upper hemithorax. Electronically Signed   By: Julian Hy M.D.   On: 10/29/2019 08:16   VAS [place] LOWER EXTREMITY VENOUS (DVT)  Result Date: 10/29/2019  Lower Venous DVTStudy Indications: Pulmonary embolism, and See Previous Cat Scan.  Risk Factors: Suspected PE. Limitations: Limited due to patient movement. Performing Technologist: Griffin Basil RCT RDMS  Examination Guidelines: A complete evaluation includes B-mode imaging, spectral Doppler, color Doppler, and power Doppler as needed of all accessible portions of each vessel. Bilateral testing is considered an integral part of a complete examination. Limited examinations for reoccurring indications may be performed as noted. The reflux portion of the exam is performed with the patient in reverse Trendelenburg.  +---------+---------------+---------+-----------+----------+--------------+ RIGHT    CompressibilityPhasicitySpontaneityPropertiesThrombus Aging +---------+---------------+---------+-----------+----------+--------------+ CFV      Full           Yes      Yes                                 +---------+---------------+---------+-----------+----------+--------------+ SFJ      Full                                                        +---------+---------------+---------+-----------+----------+--------------+ FV Prox  Full                                                         +---------+---------------+---------+-----------+----------+--------------+ FV Mid   Full                                                        +---------+---------------+---------+-----------+----------+--------------+ FV DistalFull                                                        +---------+---------------+---------+-----------+----------+--------------+ PFV      Full                                                        +---------+---------------+---------+-----------+----------+--------------+  POP      Full                    Yes                                 +---------+---------------+---------+-----------+----------+--------------+ PTV      Full                                                        +---------+---------------+---------+-----------+----------+--------------+ PERO     Full                                                        +---------+---------------+---------+-----------+----------+--------------+   +---------+---------------+---------+-----------+----------+------------------+ LEFT     CompressibilityPhasicitySpontaneityPropertiesThrombus Aging     +---------+---------------+---------+-----------+----------+------------------+ CFV      Full           Yes      Yes                                     +---------+---------------+---------+-----------+----------+------------------+ SFJ      Full                                                            +---------+---------------+---------+-----------+----------+------------------+ FV Prox  Full                                                            +---------+---------------+---------+-----------+----------+------------------+ FV Mid   Full                                                            +---------+---------------+---------+-----------+----------+------------------+ FV DistalFull                                                             +---------+---------------+---------+-----------+----------+------------------+ PFV      Full                                                            +---------+---------------+---------+-----------+----------+------------------+ POP      Full                                                            +---------+---------------+---------+-----------+----------+------------------+  PTV      Full                                                            +---------+---------------+---------+-----------+----------+------------------+ PERO     Full                                         Limited                                                                  visualization      +---------+---------------+---------+-----------+----------+------------------+     Summary: RIGHT: - There is no evidence of deep vein thrombosis in the lower extremity.  - No cystic structure found in the popliteal fossa.  LEFT: - There is no evidence of deep vein thrombosis in the lower extremity.  - No cystic structure found in the popliteal fossa.  *See table(s) above for measurements and observations. Electronically signed by Servando Snare MD on 10/29/2019 at 1:01:37 PM.    Final    [place] EKG SITE RITE  Result Date: 10/28/2019 If Site Rite image not attached, placement could not be confirmed due to current cardiac rhythm.   Labs:  CBC: Recent Labs    10/29/19 0555 10/29/19 0555 10/30/19 0105 10/30/19 1218 10/31/19 0605 11/01/19 0241  WBC 5.3  --  7.2  --  8.0 8.2  HGB 9.2*   < > 9.5* 10.5* 10.5* 10.6*  HCT 30.5*   < > 31.7* 31.0* 34.2* 34.4*  PLT 159  --  190  --  154 208   < > = values in this interval not displayed.    COAGS: No results for input(s): INR, APTT in the last 8760 hours.  BMP: Recent Labs    10/29/19 0555 10/29/19 0555 10/30/19 1155 10/30/19 1218 10/31/19 0605 11/01/19 0241  NA 136   < > 130* 134* 135 136  K 5.2*   < > 5.6* 4.8 4.3 4.0  CL 97*  --  93*  --   94* 89*  CO2 32  --  28  --  32 35*  GLUCOSE 105*  --  133*  --  110* 92  BUN 19  --  21  --  16 13  CALCIUM 8.1*  --  7.9*  --  8.0* 8.3*  CREATININE 0.61  --  0.54  --  0.60 0.52  GFRNONAA >60  --  >60  --  >60 >60  GFRAA >60  --  >60  --  >60 >60   < > = values in this interval not displayed.    LIVER FUNCTION TESTS: Recent Labs    10/26/19 1115 10/26/19 1115 10/27/19 0613 10/28/19 0232 10/31/19 0605 11/01/19 0241  BILITOT 0.8  --  1.0 1.0 1.9*  --   AST 25  --  23 22 22   --   ALT 83*  --  68* 60* 33  --   ALKPHOS 86  --  78 71  50  --   PROT 6.5  --  6.5 6.1* 5.3*  --   ALBUMIN 3.4*   < > 3.4* 3.3* 3.0* 3.0*   < > = values in this interval not displayed.    Assessment and Plan: Loculated right pleural effusion Patient with small right loculated pleural effusion.  IR consulted 9/12 for possible chest tube placement.  Patient brought to IR 9/13 for attempt, however was uncooperative due to AMS.  Her AMS has now resolved.  She is alert and oriented.  TCTS has requested re-attempt and patient is agreeable. Case reviewed again by Dr. Earleen Newport who notes potential small yield, but collection likely amenable.  NPO p MN today for chest tube placement in CT tomorrow.  She is on heparin drip for PE. Will notify RN tomorrow of hold time.  Risks and benefits discussed with the patient including bleeding, infection, damage to adjacent structures,  and sepsis.  All of the patient's questions were answered, patient is agreeable to proceed. Consent signed and in chart.   Electronically Signed: Docia Barrier, PA 11/01/2019, 2:09 PM   I spent a total of 15 Minutes at the the patient's bedside AND on the patient's hospital floor or unit, greater than 50% of which was counseling/coordinating care for right loculated pleural effusion.

## 2019-11-01 NOTE — Progress Notes (Addendum)
NAME:  Emily Rocha, MRN:  [id number], DOB:  18-May-1955, LOS: 8 ADMISSION DATE:  10/24/2019, CONSULTATION DATE:  9/12 REFERRING MD:  Karleen Hampshire, CHIEF COMPLAINT:  Delirium    Brief History   64 year old female with history of COPD, coronary artery disease, adenocarcinoma of the right lung requiring VATS and right upper lobe lobectomy, also newly identified hiatal hernia admitted initially with pneumonia and what appears to be complicated right pleural effusion at [facility] subsequently transferred to [facility] on 9/11 for cardiothoracic evaluation and interventional radiology drainage.  Critical care asked to evaluate the patient on 9/12 for refractory delirium, and concern about progressive hypercarbic respiratory failure.  History of present illness   This is a 64 year old female with history as mentioned below.  Admitted on 9/8 with chief complaint of delirium, and worsening respiratory distress found to have large loculated right-sided pleural effusion, she was initially admitted at [facility]  There was attempt to drain this unsuccessfully, she was therefore transferred to [facility] for thoracic surgery consultation and CT-guided chest tube drainage.  On CT evaluation with CT angiogram was also found to have interval development of thrombus in the most distal portion of the right upper lobe branch of the right pulmonary artery, was therefore started on anticoagulation also on imaging found to have large hiatal hernia.SinceShe was transferred to [facility] on 9/11, arriving at [facility] she has had worsening and progressive delirium, requiring frequent doses of Ativan, increased doses of Haldol, and direct one-on-one safety sitter as she has continued to pull out tubes and lines.  Additional evaluation has included arterial blood gas most recently showing pH of 7.34, PCO2 of 63, PO2 of 86, bicarbonate of 33.  Ammonia level 25, TSH level normal.  She has been seen by  both thoracic surgery and interventional cardiology.  Plan is for CT-guided drainage, stopping heparin drip in anticipation of this.  Plan is to place this on 9/13.  Because of worsening delirium critical care was asked to evaluate.  Past Medical History   COPD, coronary artery disease with prior CABG 2018, adenocarcinoma of the lung status post VATS with right upper lobectomy, moderate to severe aortic stenosis  Significant Hospital Events   9/9 admitted to [facility] antibiotic started, seen in consultation by [place] we recommended thoracic surgery evaluation 9/10: Developing worsening delirium felt secondary to infection, as well as hyponatremia.  Administered repeated doses of Ativan and Haldol 9/12, continues to have refractory delirium in spite of repeated doses of Ativan and Haldol.  Not tolerating BiPAP.  Pulling out IVs.  Started on precedex   Consults:  Interventional radiology Cardiothoracic surgery   Procedures:    Significant Diagnostic Tests:  CT chest 9/8: This demonstrated prior postoperative changes of the right lobe upper lobectomy, new interval development of thrombus within the most distal portion of right upper lobe pulmonary artery branch feeding to residual stump.  Moderate sized loculated right effusion with subsegmental atelectasis and what appears to be some degree of loculation moderate sized sliding-type hiatal hernia and aortic arteriosclerosis. 9/9: CT of head negative for acute findings  9/13 duplex BLE >> neg  9/11 echo >> severe AS  Micro Data:  9/8 blood culture x2>>>ng MRSA PCR positive Coronavirus/SARS: Negative  Antimicrobials:  Unasyn 9/8 >> Azithromycin 9/8>>>9/13 Vancomycin 9/9>>>  Interim history/subjective:  Afebrile, WBC 8.2 Off precedex Alert/ oriented today, walking halls No complaints, very pleasant and cooperative  On [place] 1-2L   Objective  Blood pressure 127/61, pulse 86, temperature 98 F (36.7 C), temperature source  Oral, resp. rate (!) 21, height 5\' 5"  (1.651 m), weight 85.5 kg, SpO2 94 %.    FiO2 (%):  [40 %] 40 %   Intake/Output Summary (Last 24 hours) at 11/01/2019 1042 Last data filed at 11/01/2019 0900 Gross per 24 hour  Intake 1376.33 ml  Output 2042 ml  Net -665.67 ml   Filed Weights   10/30/19 0500 10/31/19 0443 11/01/19 0500  Weight: 89.9 kg 89.1 kg 85.5 kg    Examination: General:  Very pleasant older female sitting in recliner in NAD HEENT: MM pink/moist, pupils 4/reactive Neuro: AOx 4, MAE CV: rr, no murmur PULM:  Non labored, speaking full sentences, no wheezes, right basilar rales otherwise clear GI: obese, NT/ ND, +bs, purwick Extremities: warm/dry, slight mottling of LE, +1 pulses, +1 edema Skin: scattered ecchymosis to UE  Resolved Hospital Problem list    Assessment & Plan:  Right complex/ loculated pleural effusion  Acute hypoxic respiratory failure ?  Aspiration related to hiatal hernia Hx of COPD, possible AE - Discussed with Dr. Kipp Brood, given resolution in her encephalopathy, will re-consult IR for CT guided intervention; hiatal hernia can be addressed outpatient - pleural fluid will need to be sent for studies, culture and g/s - supplemental O2 for sat goal >88- 96% - Continue pulmonary hygiene efforts  - continue abx,  unasyn/ vanc day 9/x for now- need to send pleural fluid for cx - continue BDs, duonebs QID, prn albuterol, and dulera  - decrease solumedrol to 20 mg daily   Agitated delirium/acute metabolic encephalopathy -multifactorial, infection, hypercarbia, hyponatremia - improving - CTH 9/9 negative - off precedex, mental status at baseline 9/16 - delirium precautions  - ? Element of benzo withdrawal attributing to delirim.  Reduced home alprazolam restarted 9/15  Acute pulmonary thrombus.  Incidentally found in the right pulmonary artery leading to old surgical stump - IV heparin per pharmacy  - We will likely need at least 3 to 6 months of  systemic anticoagulation  Severe aortic stenosis, with chronic diastolic heart failure grade 1 - continued telemetry monitoring  - goal euvolemia - s/p lasix 9/15, net +670 - Continue home demadex   Hyponatremia- resolved Mild Hyperkalemia 9/13- resolved - Trend BMET   History of coronary artery disease - Continue Lopressor, aspirin, and Imdur   Remote history of adenocarcinoma of the lung status post right upper lobe lobectomy 2018 - outpatient follow-up and surveillance   GI No BM since admission - bowel regimen in place  Protein calorie malnutrition  -  Maximize nutrition   Best practice:  Diet: Clear liquids- > advance to heart healthy Pain/Anxiety/Delirium protocol (if indicated):  n/a VAP protocol (if indicated): Not indicated DVT prophylaxis: IV heparin GI prophylaxis: PPI Glucose control: within goal  Mobility: push PT efforts Code Status: Full code Family Communication: patient updated at bedside Disposition: tx to PCU and [place] as of 9/17; PCCM will follow for pulmonary needs   Labs   CBC: Recent Labs  Lab 10/28/19 0232 10/28/19 0232 10/29/19 0555 10/30/19 0105 10/30/19 1218 10/31/19 0605 11/01/19 0241  WBC 6.3  --  5.3 7.2  --  8.0 8.2  HGB 9.4*   < > 9.2* 9.5* 10.5* 10.5* 10.6*  HCT 31.9*   < > 30.5* 31.7* 31.0* 34.2* 34.4*  MCV 89.9  --  90.8 88.3  --  87.9 87.1  PLT 146*  --  159 190  --  154  208   < > = values in this interval not displayed.    Basic Metabolic Panel: Recent Labs  Lab 10/28/19 0232 10/28/19 0232 10/28/19 1355 10/29/19 0555 10/30/19 1155 10/30/19 1218 10/31/19 0605 11/01/19 0241  NA 134*   < >  --  136 130* 134* 135 136  K 5.1   < >  --  5.2* 5.6* 4.8 4.3 4.0  CL 95*  --   --  97* 93*  --  94* 89*  CO2 33*  --   --  32 28  --  32 35*  GLUCOSE 111*  --   --  105* 133*  --  110* 92  BUN 20  --   --  19 21  --  16 13  CREATININE 0.63  --   --  0.61 0.54  --  0.60 0.52  CALCIUM 8.2*  --   --  8.1* 7.9*  --  8.0* 8.3*    MG  --   --  2.9*  --   --   --  2.1  --   PHOS  --   --   --   --   --   --   --  3.1   < > = values in this interval not displayed.   GFR: Estimated Creatinine Clearance: 74.7 mL/min (by C-G formula based on SCr of 0.52 mg/dL). Recent Labs  Lab 10/29/19 0555 10/30/19 0105 10/31/19 0605 11/01/19 0241  WBC 5.3 7.2 8.0 8.2    Liver Function Tests: Recent Labs  Lab 10/26/19 1115 10/27/19 0613 10/28/19 0232 10/31/19 0605 11/01/19 0241  AST 25 23 22 22   --   ALT 83* 68* 60* 33  --   ALKPHOS 86 78 71 50  --   BILITOT 0.8 1.0 1.0 1.9*  --   PROT 6.5 6.5 6.1* 5.3*  --   ALBUMIN 3.4* 3.4* 3.3* 3.0* 3.0*   No results for input(s): LIPASE, AMYLASE in the last 168 hours. Recent Labs  Lab 10/28/19 1355 10/31/19 0611  AMMONIA 25 58*    ABG    Component Value Date/Time   PHART 7.399 10/30/2019 1218   PCO2ART 54.1 (H) 10/30/2019 1218   PO2ART 69 (L) 10/30/2019 1218   HCO3 33.4 (H) 10/30/2019 1218   TCO2 35 (H) 10/30/2019 1218   O2SAT 93.0 10/30/2019 [place], MSN, AGACNP-BC [facility] 11/01/2019, 10:42 AM  See Shea Evans for personal pager PCCM on call pager [phone]

## 2019-11-01 NOTE — Progress Notes (Signed)
Physical Therapy Treatment Patient Details Name: Emily Rocha MRN: [id number] DOB: 1955-08-12 Today's Date: 11/01/2019    History of Present Illness Pt is 64 yo admitted to [facility] on 9/8 with SOB found to have loculated pleural effusion with progressive confusion and transferred to [facility] 9/11. Pt with metabolic encephalopathy and unable to tolerate IR place chest tube 9/13. PMhx: Right upper lobectomy due to lung CA 10/2018, severe AS, hiatal hernia, COPD, CAD    PT Comments    Pt making excellent progress.  She was able to ambulate 190' in the hall with min guard.  She did require min cues for safe transfer and gait techniques.  Pt was on 1 LPM O2 at rest but required 2 LPM with gait to maintain sats.  Still with mild impulsivity - but significantly improved cognition.  Had chair follow but could likely progress to assist of 1 next treatment.     Follow Up Recommendations  Home health PT;Supervision/Assistance - 24 hour     Equipment Recommendations  Rolling walker with 5" wheels    Recommendations for Other Services       Precautions / Restrictions Precautions Precautions: Fall Precaution Comments: watch sats Restrictions Weight Bearing Restrictions: No    Mobility  Bed Mobility Overal bed mobility: Needs Assistance Bed Mobility: Supine to Sit     Supine to sit: Min guard     General bed mobility comments: min guard for safety, HOB up, use of rail  Transfers Overall transfer level: Needs assistance Equipment used: Rolling walker (2 wheeled) Transfers: Sit to/from Stand Sit to Stand: Min assist         General transfer comment: Sit to stand x 2 (from bed and toilet); cues for safe hand placement  Ambulation/Gait Ambulation/Gait assistance: Min guard Gait Distance (Feet): 190 Feet Assistive device: Rolling walker (2 wheeled) Gait Pattern/deviations: Step-through pattern;Decreased stride length Gait velocity: decreased   General Gait Details: Min cues for safety  and RW proximity; min A with walker during 2 turns; had chair follow for safety   Stairs             Wheelchair Mobility    Modified Rankin (Stroke Patients Only)       Balance Overall balance assessment: Needs assistance Sitting-balance support: Feet supported;No upper extremity supported Sitting balance-Leahy Scale: Good     Standing balance support: During functional activity;No upper extremity supported Standing balance-Leahy Scale: Fair Standing balance comment: Pt was able to take hands from walker for short time for washing hands at sink                            Cognition Arousal/Alertness: Awake/alert Behavior During Therapy: Impulsive Overall Cognitive Status: Impaired/Different from baseline Area of Impairment: Safety/judgement                         Safety/Judgement: Decreased awareness of deficits;Decreased awareness of safety     General Comments: Pt with mild impulsivity requiring min cues for lines/lead and taking her time.  Significantly better than prior visits      Exercises      General Comments General comments (skin integrity, edema, etc.): Pt was on 1 LPM O2 at rest with sats 96%.  Dropped to 86% with walking so increased to 2 LPM then maintained 91% at lowest.  Placed back on 1 LPM at rest with sats >90%.      Pertinent Vitals/Pain Pain  Assessment: Faces Faces Pain Scale: Hurts a little bit Pain Location: back Pain Descriptors / Indicators: Aching Pain Intervention(s): Limited activity within patient's tolerance;Monitored during session;Repositioned ("felt better OOB")    Home Living                      Prior Function            PT Goals (current goals can now be found in the care plan section) Acute Rehab PT Goals Patient Stated Goal: to breathe better and go home PT Goal Formulation: With patient Time For Goal Achievement: 11/09/19 Potential to Achieve Goals: Good Progress towards PT goals:  Progressing toward goals    Frequency    Min 3X/week      PT Plan Current plan remains appropriate    Co-evaluation   Reason for Co-Treatment: Complexity of the patient's impairments (multi-system involvement);For patient/therapist safety          AM-PAC PT "6 Clicks" Mobility   Outcome Measure  Help needed turning from your back to your side while in a flat bed without using bedrails?: None Help needed moving from lying on your back to sitting on the side of a flat bed without using bedrails?: None Help needed moving to and from a bed to a chair (including a wheelchair)?: A Little Help needed standing up from a chair using your arms (e.g., wheelchair or bedside chair)?: A Little Help needed to walk in hospital room?: A Little Help needed climbing 3-5 steps with a railing? : A Lot 6 Click Score: 19    End of Session Equipment Utilized During Treatment: Oxygen;Gait belt Activity Tolerance: Patient tolerated treatment well Patient left: with chair alarm set;in chair;with call bell/phone within reach Nurse Communication: Mobility status PT Visit Diagnosis: Unsteadiness on feet (R26.81);Other abnormalities of gait and mobility (R26.89);Muscle weakness (generalized) (M62.81)     Time: 9767-3419 PT Time Calculation (min) (ACUTE ONLY): 26 min  Charges:  $Gait Training: 8-22 mins                     Abran Richard, PT Acute Rehab Services Pager [phone] [facility] [place] 11/01/2019, 11:11 AM

## 2019-11-01 NOTE — Progress Notes (Signed)
     [address]       [place] [address]             [phone]        Patient doing much better clinically. Mental status is improved Only requiring 2 L of oxygen with ambulation and 1 L at rest.  Agree with CT-guided chest tube placement attempt.  Given that her mental status is improved this is more likely to be successful.  She may require some lytic therapy. We will discuss hiatal hernia repair as an outpatient.  Emily Rocha Bary Leriche

## 2019-11-01 NOTE — Plan of Care (Signed)

## 2019-11-02 ENCOUNTER — Inpatient Hospital Stay (HOSPITAL_COMMUNITY): Payer: Medicare HMO

## 2019-11-02 DIAGNOSIS — K449 Diaphragmatic hernia without obstruction or gangrene: Secondary | ICD-10-CM

## 2019-11-02 DIAGNOSIS — J690 Pneumonitis due to inhalation of food and vomit: Secondary | ICD-10-CM

## 2019-11-02 DIAGNOSIS — E876 Hypokalemia: Secondary | ICD-10-CM

## 2019-11-02 DIAGNOSIS — Z85118 Personal history of other malignant neoplasm of bronchus and lung: Secondary | ICD-10-CM

## 2019-11-02 DIAGNOSIS — J9602 Acute respiratory failure with hypercapnia: Secondary | ICD-10-CM

## 2019-11-02 DIAGNOSIS — Z902 Acquired absence of lung [part of]: Secondary | ICD-10-CM

## 2019-11-02 DIAGNOSIS — I8290 Acute embolism and thrombosis of unspecified vein: Secondary | ICD-10-CM | POA: Diagnosis present

## 2019-11-02 LAB — MAGNESIUM
Magnesium: 1.8 mg/dL (ref 1.7–2.4)
Magnesium: 1.7 mg/dL (ref 1.7–2.4)

## 2019-11-02 LAB — CBC
HCT: 34.7 % — ABNORMAL LOW (ref 36.0–46.0)
Hemoglobin: 10.7 g/dL — ABNORMAL LOW (ref 12.0–15.0)
MCH: 26.8 pg (ref 26.0–34.0)
MCHC: 30.8 g/dL (ref 30.0–36.0)
MCV: 87 fL (ref 80.0–100.0)
Platelets: 255 10*3/uL (ref 150–400)
RBC: 3.99 MIL/uL (ref 3.87–5.11)
RDW: 15.8 % — ABNORMAL HIGH (ref 11.5–15.5)
WBC: 9.1 10*3/uL (ref 4.0–10.5)
nRBC: 0 % (ref 0.0–0.2)

## 2019-11-02 LAB — BASIC METABOLIC PANEL
Anion gap: 9 (ref 5–15)
BUN: 11 mg/dL (ref 8–23)
CO2: 37 mmol/L — ABNORMAL HIGH (ref 22–32)
Calcium: 8.4 mg/dL — ABNORMAL LOW (ref 8.9–10.3)
Chloride: 89 mmol/L — ABNORMAL LOW (ref 98–111)
Creatinine, Ser: 0.62 mg/dL (ref 0.44–1.00)
GFR calc Af Amer: >60 mL/min (ref >60)
GFR calc non Af Amer: >60 mL/min (ref >60)
Glucose, Bld: 89 mg/dL (ref 70–99)
Potassium: 3 mmol/L — ABNORMAL LOW (ref 3.5–5.1)
Sodium: 135 mmol/L (ref 135–145)

## 2019-11-02 LAB — HEPARIN LEVEL (UNFRACTIONATED)
Heparin Unfractionated: 0.72 IU/mL — ABNORMAL HIGH (ref 0.30–0.70)
Heparin Unfractionated: 0.15 IU/mL — ABNORMAL LOW (ref 0.30–0.70)
Heparin Unfractionated: 0.23 IU/mL — ABNORMAL LOW (ref 0.30–0.70)

## 2019-11-02 LAB — GLUCOSE, CAPILLARY
Glucose-Capillary: 100 mg/dL — ABNORMAL HIGH (ref 70–99)
Glucose-Capillary: 84 mg/dL (ref 70–99)

## 2019-11-02 MED ORDER — MAGNESIUM SULFATE 2 GM/50ML IV SOLN
2.0000 g | Freq: Once | INTRAVENOUS | Status: AC
  Administered 2019-11-02: 2 g via INTRAVENOUS
  Filled 2019-11-02: qty 50

## 2019-11-02 MED ORDER — AMOXICILLIN-POT CLAVULANATE 875-125 MG PO TABS
1.0000 | ORAL_TABLET | Freq: Two times a day (BID) | ORAL | Status: DC
  Administered 2019-11-02 – 2019-11-06 (×8): 1 via ORAL
  Filled 2019-11-02 (×8): qty 1

## 2019-11-02 MED ORDER — MIDAZOLAM HCL 2 MG/2ML IJ SOLN
INTRAMUSCULAR | Status: AC
  Filled 2019-11-02: qty 2

## 2019-11-02 MED ORDER — FENTANYL CITRATE (PF) 100 MCG/2ML IJ SOLN
INTRAMUSCULAR | Status: DC
Start: 2019-11-02 — End: 2019-11-02
  Filled 2019-11-02: qty 2

## 2019-11-02 MED ORDER — PANTOPRAZOLE SODIUM 40 MG PO TBEC
40.0000 mg | DELAYED_RELEASE_TABLET | Freq: Every day | ORAL | Status: DC
  Administered 2019-11-02 – 2019-11-06 (×5): 40 mg via ORAL
  Filled 2019-11-02 (×5): qty 1

## 2019-11-02 MED ORDER — POTASSIUM CHLORIDE CRYS ER 20 MEQ PO TBCR
40.0000 meq | EXTENDED_RELEASE_TABLET | Freq: Once | ORAL | Status: AC
  Administered 2019-11-02: 40 meq via ORAL
  Filled 2019-11-02: qty 2

## 2019-11-02 MED ORDER — POTASSIUM CHLORIDE 10 MEQ/100ML IV SOLN
10.0000 meq | INTRAVENOUS | Status: AC
  Administered 2019-11-02 (×5): 10 meq via INTRAVENOUS
  Filled 2019-11-02 (×6): qty 100

## 2019-11-02 NOTE — Procedures (Signed)
Pre procedural Dx: Loculated right sided pleural effusion  Post procedural Dx: Same  Significant reduction/near resolution of residual trace right-sided effusion, too small to warrant CT-guided chest tube placement.  No procedure performed.   D/W Dr. Kipp Brood at the time of procedure cancellation.   Ronny Bacon, MD Pager #: [phone]

## 2019-11-02 NOTE — Progress Notes (Signed)
[place] for heparin IV Indication: Rule out ACS/ thrombus seen on CT  No Known Allergies  Patient Measurements: Height: 5\' 5"  (165.1 cm) Weight: 85.5 kg (188 lb 7.9 oz) IBW/kg (Calculated) : 57 Heparin Dosing Weight: 73 kg  Vital Signs: Temp: 99.6 F (37.6 C) (09/16 2300) Temp Source: Oral (09/16 2300) BP: 100/55 (09/17 0100) Pulse Rate: 81 (09/17 0100)  Labs: Recent Labs    10/30/19 0838 10/30/19 1155 10/30/19 1218 10/31/19 0605 10/31/19 0605 10/31/19 0611 11/01/19 0241 11/02/19 0028  HGB  --   --    < > 10.5*   < >  --  10.6* 10.7*  HCT  --   --    < > 34.2*  --   --  34.4* 34.7*  PLT  --   --   --  154  --   --  208 255  HEPARINUNFRC   < >  --   --   --   --  0.54 0.68 0.72*  CREATININE  --  0.54  --  0.60  --   --  0.52  --    < > = values in this interval not displayed.    Estimated Creatinine Clearance: 74.7 mL/min (by C-G formula based on SCr of 0.52 mg/dL).   Assessment: IV heparin for elevated troponins and thrombus on CT (not PE). Hgb 12.1. Ddimer 5.27 on admit.  - 9/8 CT: s/p RU lobectomy. Interval development of thrombus within the most distal portion of the residual stump of the right upper lobe branch of the right pulmonary artery. No definite pulmonary embolus is noted.  Heparin level now up to slightly supratherapeutic (0.72) on gtt at 1100 units/hr. No bleeding noted per RN.  Goal of Therapy:  Heparin level 0.3-0.7 units/ml Monitor platelets by anticoagulation protocol: Yes   Plan:  Decrease heparin to 1000 units/h Will f/u 6 hr heparin level  Sherlon Handing, PharmD, BCPS Please see amion for complete clinical pharmacist phone listt 11/02/2019 1:41 AM

## 2019-11-02 NOTE — Progress Notes (Addendum)
NAME:  Emily Rocha, MRN:  [id number], DOB:  06-05-1955, LOS: 45 ADMISSION DATE:  10/24/2019, CONSULTATION DATE:  9/12 REFERRING MD:  Karleen Hampshire, CHIEF COMPLAINT:  Delirium    Brief History   64 year old female with history of COPD, coronary artery disease, adenocarcinoma of the right lung requiring VATS and right upper lobe lobectomy, also newly identified hiatal hernia admitted initially with pneumonia and what appears to be complicated right pleural effusion at [facility] subsequently transferred to [facility] on 9/11 for cardiothoracic evaluation and interventional radiology drainage.  Critical care asked to evaluate the patient on 9/12 for refractory delirium, and concern about progressive hypercarbic respiratory failure.  History of present illness   This is a 64 year old female with history as mentioned below.  Admitted on 9/8 with chief complaint of delirium, and worsening respiratory distress found to have large loculated right-sided pleural effusion, she was initially admitted at [facility].  There was attempt to drain this unsuccessfully, she was therefore transferred to [facility] for thoracic surgery consultation and CT-guided chest tube drainage.  On CT evaluation with CT angiogram was also found to have interval development of thrombus in the most distal portion of the right upper lobe branch of the right pulmonary artery, was therefore started on anticoagulation also on imaging found to have large hiatal hernia.SinceShe was transferred to [facility] on 9/11, arriving at [facility] she has had worsening and progressive delirium, requiring frequent doses of Ativan, increased doses of Haldol, and direct one-on-one safety sitter as she has continued to pull out tubes and lines.  Additional evaluation has included arterial blood gas most recently showing pH of 7.34, PCO2 of 63, PO2 of 86, bicarbonate of 33.  Ammonia level 25, TSH level normal.  She has been seen by  both thoracic surgery and interventional cardiology.  Plan is for CT-guided drainage, stopping heparin drip in anticipation of this.  Plan is to place this on 9/13.  Because of worsening delirium critical care was asked to evaluate.  Past Medical History   COPD, coronary artery disease with prior CABG 2018, adenocarcinoma of the lung status post VATS with right upper lobectomy, moderate to severe aortic stenosis  Significant Hospital Events   9/9 admitted to [facility] antibiotic started, seen in consultation by [place] we recommended thoracic surgery evaluation 9/10: Developing worsening delirium felt secondary to infection, as well as hyponatremia.  Administered repeated doses of Ativan and Haldol 9/12, continues to have refractory delirium in spite of repeated doses of Ativan and Haldol.  Not tolerating BiPAP.  Pulling out IVs.  Started on precedex   Consults:  Interventional radiology Cardiothoracic surgery   Procedures:    Significant Diagnostic Tests:  CT chest 9/8: This demonstrated prior postoperative changes of the right lobe upper lobectomy, new interval development of thrombus within the most distal portion of right upper lobe pulmonary artery branch feeding to residual stump.  Moderate sized loculated right effusion with subsegmental atelectasis and what appears to be some degree of loculation moderate sized sliding-type hiatal hernia and aortic arteriosclerosis. 9/9: CT of head negative for acute findings  9/13 duplex BLE >> neg  9/11 echo >> severe AS  Micro Data:  9/8 blood culture x2>>>ng MRSA PCR positive Coronavirus/SARS: Negative  Antimicrobials:  Unasyn 9/8 >> Azithromycin 9/8>>>9/13 Vancomycin 9/9>>>  Interim history/subjective:  Afebrile, WBC 9.1 T Max 99.6 Off precedex Alert/ oriented today, walking halls, pleasant No complaints, very pleasant and cooperative  On [place] 1-2L  Went to IR, Procedure Thoracentesis was not done, per Nursing  And  patient, they were told there was not enough fluid to drain.  Will restart heparin per IR order + 1400 cc's   Objective   Blood pressure (!) 120/51, pulse 91, temperature 98.3 F (36.8 C), temperature source Oral, resp. rate (!) 22, height 5\' 5"  (1.651 m), weight 85.7 kg, SpO2 98 %.        Intake/Output Summary (Last 24 hours) at 11/02/2019 0819 Last data filed at 11/02/2019 0700 Gross per 24 hour  Intake 2531.44 ml  Output 2000 ml  Net 531.44 ml   Filed Weights   10/31/19 0443 11/01/19 0500 11/02/19 0309  Weight: 89.1 kg 85.5 kg 85.7 kg    Examination: General:  Very pleasant older female supine in bed, just returned from IR HEENT: MM pink/moist, pupils 4/reactive Neuro: AOx 4, MAE, pleasant CV: S1, S2, rr, no murmur, R, G PULM:  Bilateral chest excursion, Non labored, speaking full sentences, no wheezes, right basilar rales otherwise clear GI: obese, NT/ ND, +bs, purwick Extremities: warm/dry, +1 pulses, +1 edema Skin: scattered ecchymosis to UE  Resolved Hospital Problem list    Assessment & Plan:  Right complex/ loculated pleural effusion  Acute hypoxic respiratory failure ?  Aspiration related to hiatal hernia Hx of COPD, possible AE - Discussed with Dr. Kipp Brood, given resolution in her encephalopathy,  hiatal hernia can be addressed outpatient IR intervention for effusion cancelled as not enough fluid to tap - supplemental O2 for sat goal >88 , wean as able  - Continue pulmonary hygiene efforts  - continue abx,  unasyn/ vanc day 10/x for now-  - continue BDs, duonebs QID, prn albuterol, and dulera  - decrease solumedrol to 20 mg daily - Continue Dulera   Agitated delirium/acute metabolic encephalopathy -multifactorial, infection, hypercarbia, hyponatremia - improving - CTH 9/9 negative - off precedex, mental status at baseline 9/16 - delirium precautions  - ? Element of benzo withdrawal attributing to delirim.  Reduced home alprazolam restarted 9/15  Acute  pulmonary thrombus.  Incidentally found in the right pulmonary artery leading to old surgical stump - IV heparin per pharmacy  - We will likely need at least 3 to 6 months of systemic anticoagulation  Severe aortic stenosis, with chronic diastolic heart failure grade 1 - continued telemetry monitoring  - goal euvolemia - s/p lasix 9/15, net +670 - Continue home demadex   Hyponatremia- resolved Mild Hyperkalemia 9/13- resolved - Trend BMET  - Trend mag - Replete electrolytes as needed  History of coronary artery disease - Continue Lopressor, aspirin, and Imdur   Remote history of adenocarcinoma of the lung status post right upper lobe lobectomy 2018 - outpatient follow-up and surveillance - Would be eligible for Lung Cancer Screening  2023 ( 5 years post lobectomy)  GI No BM since admission - bowel regimen in place  Protein calorie malnutrition  -  Maximize nutrition   Best practice:  Diet: Clear liquids- > advance to heart healthy Pain/Anxiety/Delirium protocol (if indicated):  n/a VAP protocol (if indicated): Not indicated DVT prophylaxis: IV heparin GI prophylaxis: PPI Glucose control: within goal  Mobility: push PT efforts Code Status: Full code Family Communication: patient updated at bedside Disposition: tx to PCU and TRH as of 9/17; PCCM will follow for pulmonary needs   Labs   CBC: Recent Labs  Lab 10/29/19 0555 10/29/19 0555 10/30/19 0105 10/30/19 1218 10/31/19 0605 11/01/19 0241 11/02/19 0028  WBC 5.3  --  7.2  --  8.0 8.2 9.1  HGB 9.2*   < > 9.5* 10.5* 10.5* 10.6* 10.7*  HCT 30.5*   < > 31.7* 31.0* 34.2* 34.4* 34.7*  MCV 90.8  --  88.3  --  87.9 87.1 87.0  PLT 159  --  190  --  154 208 255   < > = values in this interval not displayed.    Basic Metabolic Panel: Recent Labs  Lab 10/28/19 0232 10/28/19 1355 10/29/19 0555 10/29/19 0555 10/30/19 1155 10/30/19 1218 10/31/19 0605 11/01/19 0241 11/02/19 0028  NA   < >  --  136   < > 130*  134* 135 136 135  K   < >  --  5.2*   < > 5.6* 4.8 4.3 4.0 3.0*  CL   < >  --  97*  --  93*  --  94* 89* 89*  CO2   < >  --  32  --  28  --  32 35* 37*  GLUCOSE   < >  --  105*  --  133*  --  110* 92 89  BUN   < >  --  19  --  21  --  16 13 11   CREATININE   < >  --  0.61  --  0.54  --  0.60 0.52 0.62  CALCIUM   < >  --  8.1*  --  7.9*  --  8.0* 8.3* 8.4*  MG  --  2.9*  --   --   --   --  2.1  --   --   PHOS  --   --   --   --   --   --   --  3.1  --    < > = values in this interval not displayed.   GFR: Estimated Creatinine Clearance: 74.8 mL/min (by C-G formula based on SCr of 0.62 mg/dL). Recent Labs  Lab 10/30/19 0105 10/31/19 0605 11/01/19 0241 11/02/19 0028  WBC 7.2 8.0 8.2 9.1    Liver Function Tests: Recent Labs  Lab 10/26/19 1115 10/27/19 0613 10/28/19 0232 10/31/19 0605 11/01/19 0241  AST 25 23 22 22   --   ALT 83* 68* 60* 33  --   ALKPHOS 86 78 71 50  --   BILITOT 0.8 1.0 1.0 1.9*  --   PROT 6.5 6.5 6.1* 5.3*  --   ALBUMIN 3.4* 3.4* 3.3* 3.0* 3.0*   No results for input(s): LIPASE, AMYLASE in the last 168 hours. Recent Labs  Lab 10/28/19 1355 10/31/19 0611  AMMONIA 25 58*    ABG    Component Value Date/Time   PHART 7.399 10/30/2019 1218   PCO2ART 54.1 (H) 10/30/2019 1218   PO2ART 69 (L) 10/30/2019 1218   HCO3 33.4 (H) 10/30/2019 1218   TCO2 35 (H) 10/30/2019 1218   O2SAT 93.0 10/30/2019 [place], MSN, AGACNP-BC [place] for personal pager PCCM on call pager [phone] 11/02/2019, 8:19 AM  See Shea Evans for personal pager PCCM on call pager [phone]   PCCM:  64 yo FM, admitted to ICU for delirium. Likely component of benzo withdrawal. PE on heparin. Complex right pleural disease. Ho of lung ca.   BP 123/72 (BP Location: Left Arm)   Pulse 97   Temp 98.3 F (36.8 C) (Oral)   Resp 20   Ht 5\' 5"  (1.651 m)   Wt 85.7 kg  SpO2 97%   BMI 31.44 kg/m   Gen: elderly fm, resting  in bed Heart: RRR. s1 s2 Lungs: CTAB  Labs reviewed   A:  Small loculated effusion  AHRF  COPD  PE  Severe AS  P: Continue nebs  Complete course of abx. Replete lytes  Heparin ggt per pharmacy   Pulmonary will follow as needed. No plans to see this weekend unless called.   Garner Nash, DO [facility] 11/02/2019 2:57 PM

## 2019-11-02 NOTE — Progress Notes (Addendum)
[place] for heparin IV Indication: Rule out ACS/ thrombus seen on CT  No Known Allergies  Patient Measurements: Height: 5\' 5"  (165.1 cm) Weight: 85.7 kg (188 lb 15 oz) IBW/kg (Calculated) : 57 Heparin Dosing Weight: 73 kg  Vital Signs: Temp: 98.3 F (36.8 C) (09/17 0645) Temp Source: Oral (09/17 0645) BP: 123/72 (09/17 1200) Pulse Rate: 97 (09/17 1200)  Labs: Recent Labs    10/31/19 0605 10/31/19 0611 11/01/19 0241 11/02/19 0028 11/02/19 0934  HGB 10.5*   < > 10.6* 10.7*  --   HCT 34.2*  --  34.4* 34.7*  --   PLT 154  --  208 255  --   HEPARINUNFRC  --    < > 0.68 0.72* 0.15*  CREATININE 0.60  --  0.52 0.62  --    < > = values in this interval not displayed.    Estimated Creatinine Clearance: 74.8 mL/min (by C-G formula based on SCr of 0.62 mg/dL).   Assessment: IV heparin for elevated troponins and thrombus on CT (not PE). Hgb 12.1. Ddimer 5.27 on admit.  - 9/8 CT: s/p RU lobectomy. Interval development of thrombus within the most distal portion of the residual stump of the right upper lobe branch of the right pulmonary artery. No definite pulmonary embolus is noted.  Heparin held today for possibility of IR procedure. Procedure canceled. Heparin level today drawn before heparin restart low at 0.15, CBC stable.  Goal of Therapy:  Heparin level 0.3-0.7 units/ml Monitor platelets by anticoagulation protocol: Yes   Plan:  Restart heparin IV at 1000 units/h 6 hour heparin level Daily heparin level and CBC   Norina Buzzard, PharmD PGY1 Pharmacy Resident 11/02/2019 2:20 PM  Addendum: Heparin level 6 hours post-restart low at 0.23. Heparin to 1150 units/hr Check 6hr heparin level

## 2019-11-02 NOTE — Progress Notes (Signed)
K 3.0 Electrolytes replaced per protocol 

## 2019-11-02 NOTE — Progress Notes (Signed)
     [address]       [place] [address]             [phone]       Patient is much improved from a respiratory and mental status standpoint. Repeat imaging did not demonstrate a large pleural effusion thus chest tube placement was aborted. Patient can transition to home with continued pulmonary toilet. I will see her as an outpatient to discuss elective repair of her hiatal hernia.  Merrell Borsuk Bary Leriche

## 2019-11-02 NOTE — Progress Notes (Signed)
Pharmacy Antibiotic Note  Emily Rocha is a 64 y.o. female admitted on 10/24/2019 with pneumonia.  Pharmacy has been consulted for Unasyn and Vancomycin dosing. Wbc now wnl, afebrile, renal function stable. Previous vancomycin level therapeutic at 17.  IR planned for repeat drain of fluid collection/ empyema today, was cancelled due to improvement and reduction in size of fluid collection.   Plan: Continue vancomycin 1000 mg IV every 12 hours. (goal 15-20) Continue Unasyn 3GM IV every 8 hours. Monitor labs, c/s, and vanco level as indicated.  Height: 5\' 5"  (165.1 cm) Weight: 85.7 kg (188 lb 15 oz) IBW/kg (Calculated) : 57  Temp (24hrs), Avg:98.8 F (37.1 C), Min:98.3 F (36.8 C), Max:99.6 F (37.6 C)  Recent Labs  Lab 10/29/19 0555 10/30/19 0105 10/30/19 1155 10/31/19 0605 11/01/19 0241 11/02/19 0028  WBC 5.3 7.2  --  8.0 8.2 9.1  CREATININE 0.61  --  0.54 0.60 0.52 0.62  VANCOTROUGH 17  --   --   --   --   --     Estimated Creatinine Clearance: 74.8 mL/min (by C-G formula based on SCr of 0.62 mg/dL).    No Known Allergies  Antimicrobials this admission: Vanco 9/9 >>  Unasyn 9/8 >>  azith 9/8>9/12  Microbiology results: 9/8 BCx: no growth 9/8 Sputum Cx: no growth 9/9 MRSA PCR: positive   Norina Buzzard, PharmD PGY1 Pharmacy Resident 11/02/2019 2:19 PM

## 2019-11-02 NOTE — Progress Notes (Signed)
Physical Therapy Treatment Patient Details Name: Emily Rocha MRN: [id number] DOB: 1955/06/16 Today's Date: 11/02/2019    History of Present Illness Pt is 64 yo admitted to [facility] on 9/8 with SOB found to have loculated pleural effusion with progressive confusion and transferred to [facility] 9/11. Pt with metabolic encephalopathy and unable to tolerate IR place chest tube 9/13. Plan was for chest tube placement 11/02/19 - but pleural effusion to small and procedure not performed. PMhx: Right upper lobectomy due to lung CA 10/2018, severe AS, hiatal hernia, COPD, CAD    PT Comments    Pt making excellent progress.  She was able to increase gait distance and participate in standing balance activities.  Required min cues for safety and min guard in standing.  She was on 2 LPM O2 with VSS.  Cont to progress mobility to LRAD and to stairs.     Follow Up Recommendations  Home health PT;Supervision/Assistance - 24 hour     Equipment Recommendations  Rolling walker with 5" wheels    Recommendations for Other Services       Precautions / Restrictions Precautions Precautions: Fall Precaution Comments: watch sats    Mobility  Bed Mobility               General bed mobility comments: in chair  Transfers Overall transfer level: Needs assistance Equipment used: None Transfers: Sit to/from Stand Sit to Stand: Min guard;Min assist         General transfer comment: Sit to stand x 2 from chair with min guard and x 1 from toielt with min A; cued for hand placement  Ambulation/Gait Ambulation/Gait assistance: Min guard Gait Distance (Feet): 400 Feet Assistive device: IV Pole Gait Pattern/deviations: Step-through pattern;Decreased stride length     General Gait Details: min cues for balance and posture   Stairs             Wheelchair Mobility    Modified Rankin (Stroke Patients Only)       Balance Overall balance assessment: Needs assistance Sitting-balance support: Feet  supported;No upper extremity supported Sitting balance-Leahy Scale: Good     Standing balance support: During functional activity;No upper extremity supported Standing balance-Leahy Scale: Good                 High Level Balance Comments: Performed standing feet together EO and EC, reaching 3 " outside BOS all directions, turn in circle, and side stepping/forwad/backward around 4' square x 3            Cognition Arousal/Alertness: Awake/alert Behavior During Therapy: Impulsive Overall Cognitive Status: Within Functional Limits for tasks assessed                                 General Comments: Pt with mild impulsivity requiring min cues for lines/lead and taking her time.  Significantly better than prior visits      Exercises Other Exercises Other Exercises: 10x1 mini squats and heel raises wwith min guard for safety    General Comments General comments (skin integrity, edema, etc.): Pt on 2 LPM O2; all VSS      Pertinent Vitals/Pain Pain Assessment: No/denies pain    Home Living                      Prior Function            PT Goals (current goals can now be found  in the care plan section) Acute Rehab PT Goals Patient Stated Goal: to breathe better and go home PT Goal Formulation: With patient Time For Goal Achievement: 11/09/19 Potential to Achieve Goals: Good Progress towards PT goals: Progressing toward goals    Frequency    Min 3X/week      PT Plan Current plan remains appropriate    Co-evaluation              AM-PAC PT "6 Clicks" Mobility   Outcome Measure  Help needed turning from your back to your side while in a flat bed without using bedrails?: None Help needed moving from lying on your back to sitting on the side of a flat bed without using bedrails?: None Help needed moving to and from a bed to a chair (including a wheelchair)?: None Help needed standing up from a chair using your arms (e.g., wheelchair  or bedside chair)?: A Little Help needed to walk in hospital room?: A Little Help needed climbing 3-5 steps with a railing? : A Little 6 Click Score: 21    End of Session Equipment Utilized During Treatment: Oxygen;Gait belt Activity Tolerance: Patient tolerated treatment well Patient left: with chair alarm set;in chair;with call bell/phone within reach         Time: 1210-1233 PT Time Calculation (min) (ACUTE ONLY): 23 min  Charges:  $Gait Training: 8-22 mins $Neuromuscular Re-education: 8-22 mins                     Abran Richard, PT Acute Rehab Services Pager [phone] [facility] [phone]     Karlton Lemon 11/02/2019, 12:46 PM

## 2019-11-02 NOTE — Progress Notes (Addendum)
[facility]  PROGRESS NOTE  [name] VQM:[id number] DOB: 1955/05/13 DOA: 10/24/2019 PCP: Glenda Chroman, MD Admit date - 10/24/2019   Admitting Physician Bonnielee Haff, MD  Outpatient Primary MD for the patient is Glenda Chroman, MD  LOS - 9 Brief Narrative   Emily Rocha is a 64 y.o. year old female with medical history significant for COPD, coronary artery disease, adenocarcinoma of the right lung requiring VATS and right upper lobe lobectomy (2018), also newly identified hiatal hernia admitted initially with pneumonia and what appeared to be complicated right pleural effusion at [facility] subsequently transferred to [facility] on 9/11 due to acute hypoxic and hypercapnic respiratory failure related to loculated right-sided pleural effusion.  Patient was started on Unasyn, azithromycin, vancomycin due to concern for pneumonia. She underwent unsuccessful attempt at ultrasound-guided thoracentesis on 10/25/2019 due to pain.  Patient was transferred to [facility] for CT surgery evaluation in order to rule out infection/possible empyema on 9/11.   Hospital course was complicated by metabolic encephalopathy related to hypercarbia, hyponatremia, infection and hospital-acquired delirium which ultimately required Precedex and monitoring in ICU by critical care team, COPD exacerbation requiring IV steroids and scheduled inhalers and BiPAP, and incidental finding of right pulmonary artery surgical stump thrombus for which she was started on IV heparin.  She was unable to have placement of chest tube by IR on 9/13 for loculated right effusion given due to agitation related to encephalopathy    Subjective  Today feels well. Mild cough. Tolerated diet today. Having BMs  A & P  Acute hypoxic/hypercarbic respiratory failure, multifactorial etiology including  pneumonia complicated by loculated right-sided pleural effusion, COPD exacerbation, improving  Reviewed prior CT imaging  and CXR and repeat CHT chest and CXR today shows pleural effusion essentially resolved without any need for intervention, Stable on 2 L O2 saturations > 95% -Continue inhaler therapy and IV steroids (weaning off) for COPD -Antibiotics for presumed aspiration pneumonia in setting of hiatal hernia (Unasyn, vancomycin day 7--given positive MRSA PCR screen) -Wean O2, goal SPO2 greater than 88%, pulmonary toilet with incentive spirometry and flutter valve. Ambulatory o2 testing in next 24 hours  Loculated right-sided pleural effusion, improved.  Presumed to be possible empyema however patient was never able to have any chest tube placed due to agitation/metabolic encephalopathy. Repeat CT chest on 9/17 shows significant reduction with only residual trace pleural effusion too small for chest tube placement -CT surgery agrees no need for chest tube placement and recommends continue pulmonary toilet -Continue to monitor respiratory status  Aspiration pneumonia in the setting of hiatal hernia versus bilateral pneumonia given bilateral opacities on prior chest x-rays. Blood cultures negative. Sputum culture no growth. MRSA PCR positive on 9/9.  Patient has been on Unasyn since 9/8 and vancomycin since 9/10. -Febrile, no leukocytosis, essentially resolved effusion will transition to oral Augmentin to cover for both aspiration pneumonia -Discontinue vancomycin closely monitor respiratory status -Incentive spirometer, flutter valve, continue out of bed to chair  Acute pulmonary artery thrombus, incidentally found in right pulmonary artery in distal portion of residual stump of RUL branch of right pulmonary artery on CTA chest, no PE noted -Continue IV heparin -Would warrant systemic anticoagulant for next 3 to 6 months, as recommended by pulmonology -- of note per chart review has not tolerated DOAC in the past ( for postop afib, per Dr. Harl Bowie)  COPD, stable. No wheezing -Dulera twice daily,  --IV  Solu-Medrol 40 mg ( 9/13)  switch to IV solumedrol 20 mg today BID.   CHF with preserved EF, grade 1 diastolic dysfunction Aortic stenosis, history of moderate ( degree not able to be determined on limited TTE on 9/11) euvolemic on exam -Torsemide --followed by Dr Leward Quan as outpatient --repeat full TTE now that patient cooperative  Hypokalemia -Check mag -Replete -Monitor BMP  Agitation, likely hospital-acquired delirium due to prolonged hospitalization further complicated by metabolic insults (metabolic/infectious encephalopathy including hypercarbia, infection, hyponatremia), resolved. Alert and oriented x4.  -Precedex drip discontinued on 9/17 this am -Continue Seroquel, Xanax 3 times daily (home medication) -Weaning steroids  CAD (prior CABG 2018) stable --continue aspirin, Imdur   Hiatal hernia  -Repair to be discussed as an outpatient --continue PPI, transitioned to oral  Remote history of adenocarcinoma the lung status post right upper lobe lobectomy (2018) -Continue outpatient follow-up and surveillance   Family Communication  :  none  Code Status :  full  Disposition Plan  :  Patient is from [place]. Anticipated d/c date: 2 to 3 days. Barriers to d/c or necessity for inpatient status: wean O2 to room air, repeat TTE for evaluation of AS, monitor respiratory status and infection on transition to oral antibiotics, need plan for anticoagulation for pulmonary thrombus still on iv heparin currently Consults  :  Ct SURGERY. PCCM, IR  Procedures  :  none  DVT Prophylaxis  :  Heparin drip  Lab Results  Component Value Date   PLT 255 11/02/2019    Diet :  Diet Order            Diet heart healthy/carb modified Room service appropriate? Yes; Fluid consistency: Thin  Diet effective now                  Inpatient Medications Scheduled Meds: . ALPRAZolam  0.25 mg Oral TID  . aspirin EC  81 mg Oral Daily  . Chlorhexidine Gluconate Cloth  6 each Topical Daily  .  folic acid  1 mg Intravenous Daily  . influenza vaccine adjuvanted  0.5 mL Intramuscular Tomorrow-1000  . isosorbide mononitrate  15 mg Oral Daily  . methylPREDNISolone (SOLU-MEDROL) injection  20 mg Intravenous Daily  . metoprolol tartrate  25 mg Oral BID  . mometasone-formoterol  2 puff Inhalation BID  . pantoprazole (PROTONIX) IV  40 mg Intravenous QHS  . QUEtiapine  25 mg Oral QHS  . sodium chloride flush  10-40 mL Intracatheter Q12H  . thiamine injection  100 mg Intravenous Daily  . torsemide  20 mg Oral Daily   Continuous Infusions: . ampicillin-sulbactam (UNASYN) IV 3 g (11/02/19 1157)  . heparin 1,000 Units/hr (11/02/19 1115)  . vancomycin Stopped (11/02/19 6213)   PRN Meds:.acetaminophen **OR** acetaminophen, albuterol, bisacodyl, bisacodyl, [DISCONTINUED] ondansetron **OR** ondansetron (ZOFRAN) IV, sodium chloride flush  Antibiotics  :   Anti-infectives (From admission, onward)   Start     Dose/Rate Route Frequency Ordered Stop   10/26/19 0600  vancomycin (VANCOCIN) IVPB 1000 mg/200 mL premix        1,000 mg 200 mL/hr over 60 Minutes Intravenous Every 12 hours 10/25/19 1654     10/25/19 1715  vancomycin (VANCOREADY) IVPB 2000 mg/400 mL        2,000 mg 200 mL/hr over 120 Minutes Intravenous  Once 10/25/19 1652 10/25/19 2000   10/24/19 2030  Ampicillin-Sulbactam (UNASYN) 3 g in sodium chloride 0.9 % 100 mL IVPB        3 g 200 mL/hr over 30 Minutes Intravenous Every 8  hours 10/24/19 1949     10/24/19 2000  azithromycin (ZITHROMAX) tablet 500 mg  Status:  Discontinued        500 mg Oral Daily 10/24/19 1923 10/28/19 1944       Objective   Vitals:   11/02/19 0930 11/02/19 1000 11/02/19 1100 11/02/19 1200  BP:   129/86 123/72  Pulse:  96 73 97  Resp:  (!) 21 11 20   Temp:      TempSrc:      SpO2: 97% 95% 100% 97%  Weight:      Height:        SpO2: 97 % O2 Flow Rate (L/min): 2 L/min FiO2 (%): 40 %  Wt Readings from Last 3 Encounters:  11/02/19 85.7 kg    10/18/19 85.8 kg  10/01/19 85.7 kg     Intake/Output Summary (Last 24 hours) at 11/02/2019 1239 Last data filed at 11/02/2019 0700 Gross per 24 hour  Intake 1852.57 ml  Output 1300 ml  Net 552.57 ml    Physical Exam:     Awake Alert, Oriented X 3, Normal affect No new F.N deficits,  [place].AT, Normal respiratory effort on 2 L, CTAB RRR,Loud SEM +ve B.Sounds, Abd Soft, No tenderness, No rebound, guarding or rigidity. No Cyanosis, No new Rash or bruise   I have personally reviewed the following:   Data Reviewed:  CBC Recent Labs  Lab 10/29/19 0555 10/29/19 0555 10/30/19 0105 10/30/19 1218 10/31/19 0605 11/01/19 0241 11/02/19 0028  WBC 5.3  --  7.2  --  8.0 8.2 9.1  HGB 9.2*   < > 9.5* 10.5* 10.5* 10.6* 10.7*  HCT 30.5*   < > 31.7* 31.0* 34.2* 34.4* 34.7*  PLT 159  --  190  --  154 208 255  MCV 90.8  --  88.3  --  87.9 87.1 87.0  MCH 27.4  --  26.5  --  27.0 26.8 26.8  MCHC 30.2  --  30.0  --  30.7 30.8 30.8  RDW 15.5  --  15.3  --  15.3 15.2 15.8*   < > = values in this interval not displayed.    Chemistries  Recent Labs  Lab 10/27/19 0613 10/27/19 4562 10/28/19 0232 10/28/19 0232 10/28/19 1355 10/29/19 0555 10/29/19 0555 10/30/19 1155 10/30/19 1218 10/31/19 0605 11/01/19 0241 11/02/19 0028 11/02/19 0934  NA 132*   < > 134*   < >  --  136   < > 130* 134* 135 136 135  --   K 5.6*   < > 5.1   < >  --  5.2*   < > 5.6* 4.8 4.3 4.0 3.0*  --   CL 93*   < > 95*   < >  --  97*  --  93*  --  94* 89* 89*  --   CO2 29   < > 33*   < >  --  32  --  28  --  32 35* 37*  --   GLUCOSE 154*   < > 111*   < >  --  105*  --  133*  --  110* 92 89  --   BUN 27*   < > 20   < >  --  19  --  21  --  16 13 11   --   CREATININE 0.51   < > 0.63   < >  --  0.61  --  0.54  --  0.60 0.52  0.62  --   CALCIUM 8.2*   < > 8.2*   < >  --  8.1*  --  7.9*  --  8.0* 8.3* 8.4*  --   MG  --   --   --   --  2.9*  --   --   --   --  2.1  --   --  1.8  AST 23  --  22  --   --   --   --   --    --  22  --   --   --   ALT 68*  --  60*  --   --   --   --   --   --  33  --   --   --   ALKPHOS 78  --  71  --   --   --   --   --   --  50  --   --   --   BILITOT 1.0  --  1.0  --   --   --   --   --   --  1.9*  --   --   --    < > = values in this interval not displayed.   ------------------------------------------------------------------------------------------------------------------ No results for input(s): CHOL, HDL, LDLCALC, TRIG, CHOLHDL, LDLDIRECT in the last 72 hours.  Lab Results  Component Value Date   HGBA1C 6.2 (H) 12/22/2017   ------------------------------------------------------------------------------------------------------------------ No results for input(s): TSH, T4TOTAL, T3FREE, THYROIDAB in the last 72 hours.  Invalid input(s): FREET3 ------------------------------------------------------------------------------------------------------------------ No results for input(s): VITAMINB12, FOLATE, FERRITIN, TIBC, IRON, RETICCTPCT in the last 72 hours.  Coagulation profile No results for input(s): INR, PROTIME in the last 168 hours.  No results for input(s): DDIMER in the last 72 hours.  Cardiac Enzymes No results for input(s): CKMB, TROPONINI, MYOGLOBIN in the last 168 hours.  Invalid input(s): CK ------------------------------------------------------------------------------------------------------------------    Component Value Date/Time   BNP 905.0 (H) 10/25/2019 2133    Micro Results Recent Results (from the past 240 hour(s))  SARS Coronavirus 2 by RT PCR (hospital order, performed in [facility] hospital lab) Nasopharyngeal Nasopharyngeal Swab     Status: None   Collection Time: 10/24/19  1:12 PM   Specimen: Nasopharyngeal Swab  Result Value Ref Range Status   SARS Coronavirus 2 NEGATIVE NEGATIVE Final    Comment: (NOTE) SARS-CoV-2 target nucleic acids are NOT DETECTED.  The SARS-CoV-2 RNA is generally detectable in upper and lower respiratory  specimens during the acute phase of infection. The lowest concentration of SARS-CoV-2 viral copies this assay Emily detect is 250 copies / mL. A negative result does not preclude SARS-CoV-2 infection and should not be used as the sole basis for treatment or other patient management decisions.  A negative result may occur with improper specimen collection / handling, submission of specimen other than nasopharyngeal swab, presence of viral mutation(s) within the areas targeted by this assay, and inadequate number of viral copies (<250 copies / mL). A negative result must be combined with clinical observations, patient history, and epidemiological information.  Fact Sheet for Patients:   StrictlyIdeas.no  Fact Sheet for Healthcare Providers: BankingDealers.co.za  This test is not yet approved or  cleared by the [place] FDA and has been authorized for detection and/or diagnosis of SARS-CoV-2 by FDA under an Emergency Use Authorization (EUA).  This EUA will remain in effect (meaning this test Emily be used) for the duration of the  COVID-19 declaration under Section 564(b)(1) of the Act, 21 U.S.C. section 360bbb-3(b)(1), unless the authorization is terminated or revoked sooner.  Performed at [facility], [address] [place], [place] [address]   Blood Culture (routine x 2)     Status: None   Collection Time: 10/24/19  1:57 PM   Specimen: Right Antecubital; Blood  Result Value Ref Range Status   Specimen Description   Final    RIGHT ANTECUBITAL BOTTLES DRAWN AEROBIC AND ANAEROBIC   Special Requests Blood Culture adequate volume  Final   Culture   Final    NO GROWTH 5 DAYS Performed at [facility], [address], [place], [place] [address]    Report Status 10/29/2019 FINAL  Final  Blood Culture (routine x 2)     Status: None   Collection Time: 10/24/19  1:57 PM   Specimen: Right Antecubital; Blood  Result Value Ref Range Status    Specimen Description   Final    RIGHT ANTECUBITAL BOTTLES DRAWN AEROBIC AND ANAEROBIC   Special Requests Blood Culture adequate volume  Final   Culture   Final    NO GROWTH 5 DAYS Performed at [facility], [address], [place], [place] [address]    Report Status 10/29/2019 FINAL  Final  MRSA PCR Screening     Status: Abnormal   Collection Time: 10/25/19 11:18 AM   Specimen: Nasal Mucosa; Nasopharyngeal  Result Value Ref Range Status   MRSA by PCR POSITIVE (A) NEGATIVE Final    Comment:        The GeneXpert MRSA Assay (FDA approved for NASAL specimens only), is one component of a comprehensive MRSA colonization surveillance program. It is not intended to diagnose MRSA infection nor to guide or monitor treatment for MRSA infections. RESULT CALLED TO, READ BACK BY AND VERIFIED WITH: MURPHY,[email or web address]  BY MATTHEWS, B 9.9.2021 Performed at [facility], [address] [place], [place] [address]     Radiology Reports CT HEAD WO CONTRAST  Result Date: 10/25/2019 CLINICAL DATA:  64 year old female with hypoxia and altered mental status. EXAM: CT HEAD WITHOUT CONTRAST TECHNIQUE: Contiguous axial images were obtained from the base of the skull through the vertex without intravenous contrast. COMPARISON:  Head CT 09/28/2017. FINDINGS: Brain: No midline shift, ventriculomegaly, mass effect, evidence of mass lesion, intracranial hemorrhage or evidence of cortically based acute infarction. Scattered, patchy bilateral cerebral white matter hypodensity appears not significantly changed. Otherwise gray-white matter differentiation remains within normal limits. Vascular: Calcified atherosclerosis at the skull base. Skull: No acute osseous abnormality identified. Sinuses/Orbits: Visualized paranasal sinuses and mastoids are stable and well pneumatized. Other: Visualized orbits and scalp soft tissues are within normal limits. IMPRESSION: 1. No acute intracranial abnormality by CT. 2. Patchy chronic white  matter changes, most commonly due to chronic small vessel disease. Electronically Signed   By: Genevie Ann M.D.   On: 10/25/2019 16:10   CT Angio Chest PE W and/or Wo Contrast  Result Date: 10/24/2019 CLINICAL DATA:  Chest pain. EXAM: CT ANGIOGRAPHY CHEST WITH CONTRAST TECHNIQUE: Multidetector CT imaging of the chest was performed using the standard protocol during bolus administration of intravenous contrast. Multiplanar CT image reconstructions and MIPs were obtained to evaluate the vascular anatomy. CONTRAST:  156mL OMNIPAQUE IOHEXOL 350 MG/ML SOLN COMPARISON:  February 27, 2019. FINDINGS: Cardiovascular: Atherosclerosis of thoracic aorta is noted without aneurysm or dissection. Status post coronary bypass graft. The patient is status post right upper lobectomy. There is the interval development of thrombus within the most distal  portion of the stump of the right upper lobe branch of the right pulmonary artery. No definite embolus is noted. Mild cardiomegaly is noted. No pericardial effusion is noted. Mediastinum/Nodes: Moderate size sliding-type hiatal hernia is noted. Thyroid gland is unremarkable. No adenopathy is noted. Lungs/Pleura: No pneumothorax is noted. Left lung is clear. Moderate size loculated right pleural effusion is noted with associated subsegmental atelectasis of the right lower lobe. Status post right upper lobectomy as described above. Upper Abdomen: No acute abnormality. Musculoskeletal: No chest wall abnormality. No acute or significant osseous findings. Review of the MIP images confirms the above findings. IMPRESSION: 1. Status post right upper lobectomy. Interval development of thrombus within the most distal portion of the residual stump of the right upper lobe branch of the right pulmonary artery. No definite pulmonary embolus is noted. 2. Moderate size loculated right pleural effusion is noted with associated subsegmental atelectasis of the right lower lobe. 3. Moderate size sliding-type  hiatal hernia. 4. Aortic atherosclerosis. Aortic Atherosclerosis (ICD10-I70.0). Electronically Signed   By: Marijo Conception M.D.   On: 10/24/2019 15:47   [place] CHEST (PLEURAL EFFUSION)  Result Date: 10/25/2019 CLINICAL DATA:  RIGHT pleural effusion by CT, history of COPD, GERD, RIGHT upper lobe lung cancer post upper lobectomy EXAM: CHEST ULTRASOUND COMPARISON:  CT angio chest 10/24/2019 FINDINGS: Small pleural effusion identified at the RIGHT lung base. Effusion is partially loculated. Atelectatic RIGHT lower lobe is adherent to the posterior chest wall and prevents safe access of the inferior thoracic cavity for thoracentesis. This appearance was seen on the prior CT but appears fixed with change in position on ultrasound. IMPRESSION: Small loculated RIGHT pleural effusion, unable to safely access for thoracentesis. Electronically Signed   By: Lavonia Dana M.D.   On: 10/25/2019 11:00   DG CHEST PORT 1 VIEW  Result Date: 11/02/2019 CLINICAL DATA:  Pleural effusion EXAM: PORTABLE CHEST 1 VIEW COMPARISON:  10/31/2019 FINDINGS: Prior median sternotomy and CABG. Heart is normal size. Bibasilar airspace opacities, right greater than left with some improvement on the right since prior study. No effusions or pneumothorax. IMPRESSION: Bilateral lower lobe airspace opacities, right greater than left with some improvement in aeration at the right base since prior study. Electronically Signed   By: Rolm Baptise M.D.   On: 11/02/2019 11:22   DG CHEST PORT 1 VIEW  Result Date: 10/31/2019 CLINICAL DATA:  Shortness of breath EXAM: PORTABLE CHEST 1 VIEW COMPARISON:  October 29, 2019 chest radiograph and chest CT October 24, 2019 FINDINGS: There is a persistent fairly small right pleural effusion. There is ill-defined airspace opacity in the right base with apparent postoperative change in this area. Elsewhere, there is mild interstitial edema. There is cardiomegaly with pulmonary venous hypertension. Patient is status  post internal mammary bypass grafting. No adenopathy. There is calcification in the aortic arch as well as in the left carotid artery. IMPRESSION: Cardiomegaly with pulmonary vascular congestion. Interstitial pulmonary edema with right pleural effusion. These are findings felt to be indicative of a degree of underlying congestive heart failure. There is superimposed airspace opacity in the right base which may represent alveolar edema or a degree of superimposed pneumonia. Postoperative changes noted. There is left carotid artery calcification. Aortic Atherosclerosis (ICD10-I70.0). Electronically Signed   By: Lowella Grip III M.D.   On: 10/31/2019 08:11   DG CHEST PORT 1 VIEW  Result Date: 10/29/2019 CLINICAL DATA:  Shortness of breath, respiratory failure, pleural effusion EXAM: PORTABLE CHEST 1 VIEW COMPARISON:  CTA chest  dated 10/24/2019 FINDINGS: Small to moderate right pleural effusion, possibly mildly improved. Associated right lower lobe opacity, likely atelectasis. Postsurgical changes in the right upper hemithorax. Left lung is clear. No frank interstitial edema. Cardiomegaly. Postsurgical changes related to prior CABG. Median sternotomy. IMPRESSION: Small to moderate right pleural effusion, possibly mildly improved. Postsurgical changes in the right upper hemithorax. Electronically Signed   By: Julian Hy M.D.   On: 10/29/2019 08:16   DG Chest Portable 1 View  Result Date: 10/24/2019 CLINICAL DATA:  Shortness of breath with fever and chills EXAM: PORTABLE CHEST 1 VIEW COMPARISON:  May 25, 2019 FINDINGS: There is a small right pleural effusion. There is postoperative change with scarring on the right. There is no edema or airspace opacity. Heart is upper normal in size with pulmonary vascularity normal. Patient is status post internal mammary bypass grafting. No adenopathy. No bone lesions. IMPRESSION: Small right pleural effusion. Scarring with volume loss on the right. No frank edema  or airspace opacity. Stable cardiac silhouette. Postoperative changes noted. Electronically Signed   By: Lowella Grip III M.D.   On: 10/24/2019 13:32   CT IMAGE GUIDED DRAINAGE BY PERCUTANEOUS CATHETER  Result Date: 11/02/2019 INDICATION: Loculated right-sided pleural effusion. Please perform image guided chest tube placement for infection source control purposes. Note, there is attempt earlier this week to perform image guided chest tube placement however the patient could not tolerate the examination secondary to altered mental status. EXAM: LIMITED CHEST CT COMPARISON:  Chest CT-10/24/2019 MEDICATIONS: None ANESTHESIA/SEDATION: None CONTRAST:  None COMPLICATIONS: None immediate. PROCEDURE: Informed written consent was obtained from the patient after a discussion of the risks, benefits and alternatives to treatment. The patient was placed supine, slightly LPO on the CT gantry and a pre procedural CT was performed demonstrating significant reduction, near complete resolution of residual trace right-sided pleural effusion with minimal amount of fluid seen at the level of the right lung apex and posteromedial aspect of the right lung base, too small to warrant CT-guided chest tube placement. Above was discussed with referring cardiothoracic surgeon, Dr. Kipp Brood, the decision was made not to proceed with intervention at this time. IMPRESSION: Significant reduction/near resolution of residual trace right-sided effusion, too small to warrant CT-guided chest tube placement. No procedure performed. Electronically Signed   By: Sandi Mariscal M.D.   On: 11/02/2019 10:43   VAS [place] LOWER EXTREMITY VENOUS (DVT)  Result Date: 10/29/2019  Lower Venous DVTStudy Indications: Pulmonary embolism, and See Previous Cat Scan.  Risk Factors: Suspected PE. Limitations: Limited due to patient movement. Performing Technologist: Griffin Basil RCT RDMS  Examination Guidelines: A complete evaluation includes B-mode imaging,  spectral Doppler, color Doppler, and power Doppler as needed of all accessible portions of each vessel. Bilateral testing is considered an integral part of a complete examination. Limited examinations for reoccurring indications may be performed as noted. The reflux portion of the exam is performed with the patient in reverse Trendelenburg.  +---------+---------------+---------+-----------+----------+--------------+ RIGHT    CompressibilityPhasicitySpontaneityPropertiesThrombus Aging +---------+---------------+---------+-----------+----------+--------------+ CFV      Full           Yes      Yes                                 +---------+---------------+---------+-----------+----------+--------------+ SFJ      Full                                                        +---------+---------------+---------+-----------+----------+--------------+  FV Prox  Full                                                        +---------+---------------+---------+-----------+----------+--------------+ FV Mid   Full                                                        +---------+---------------+---------+-----------+----------+--------------+ FV DistalFull                                                        +---------+---------------+---------+-----------+----------+--------------+ PFV      Full                                                        +---------+---------------+---------+-----------+----------+--------------+ POP      Full                    Yes                                 +---------+---------------+---------+-----------+----------+--------------+ PTV      Full                                                        +---------+---------------+---------+-----------+----------+--------------+ PERO     Full                                                        +---------+---------------+---------+-----------+----------+--------------+    +---------+---------------+---------+-----------+----------+------------------+ LEFT     CompressibilityPhasicitySpontaneityPropertiesThrombus Aging     +---------+---------------+---------+-----------+----------+------------------+ CFV      Full           Yes      Yes                                     +---------+---------------+---------+-----------+----------+------------------+ SFJ      Full                                                            +---------+---------------+---------+-----------+----------+------------------+ FV Prox  Full                                                            +---------+---------------+---------+-----------+----------+------------------+  FV Mid   Full                                                            +---------+---------------+---------+-----------+----------+------------------+ FV DistalFull                                                            +---------+---------------+---------+-----------+----------+------------------+ PFV      Full                                                            +---------+---------------+---------+-----------+----------+------------------+ POP      Full                                                            +---------+---------------+---------+-----------+----------+------------------+ PTV      Full                                                            +---------+---------------+---------+-----------+----------+------------------+ PERO     Full                                         Limited                                                                  visualization      +---------+---------------+---------+-----------+----------+------------------+     Summary: RIGHT: - There is no evidence of deep vein thrombosis in the lower extremity.  - No cystic structure found in the popliteal fossa.  LEFT: - There is no evidence of deep vein  thrombosis in the lower extremity.  - No cystic structure found in the popliteal fossa.  *See table(s) above for measurements and observations. Electronically signed by Servando Snare MD on 10/29/2019 at 1:01:37 PM.    Final    ECHOCARDIOGRAM LIMITED  Result Date: 10/27/2019    ECHOCARDIOGRAM LIMITED REPORT   Patient Name:   Emily Rocha Date of Exam: 10/27/2019 Medical Rec #:  [id number]    Height:       65.0 in Accession #:    [id number]   Weight:       196.6 lb Date of Birth:  1955-03-08    BSA:  1.964 m Patient Age:    66 years     BP:           148/66 mmHg Patient Gender: F            HR:           84 bpm. Exam Location:  Inpatient Procedure: Limited Echo, Limited Color Doppler and Cardiac Doppler Indications:     Dyspnea 786.09 / R06.00  History:         Patient has prior history of Echocardiogram examinations, most                  recent 08/10/2019. CAD; Prior CABG. Previous Myocardial                  Infarction, Prior                  CABG, COPD; Signs/Symptoms:Shortness of Breath. 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                  Parapneumonic right-sided effusion.  Sonographer:     Darlina Sicilian RDCS Referring Phys:  [id number] COURAGE EMOKPAE Diagnosing Phys: Fransico Him MD  Sonographer Comments: Image acquisition challenging due to patient behavioral factors. IMPRESSIONS  1. Left ventricular endocardial border not optimally defined to evaluate regional wall motion. Left ventricular diastolic function could not be evaluated.Patient was not cooperative with exam.  2. Right ventricular systolic function was not well visualized. The right ventricular size is not well visualized.  3. The mitral valve is normal in structure. Mild mitral valve regurgitation. No evidence of mitral stenosis. Moderate mitral annular calcification.  4. The aortic valve is calcified. There is severe calcifcation of the aortic valve. There is moderate thickening of the  aortic valve. Aortic valve regurgitation is not visualized. There is likely severe AS but study not sufficient for adequate assessment.  5. Recommend repeat 2D echo full study once patient able to cooperate. FINDINGS  Left Ventricle: Left ventricular endocardial border not optimally defined to evaluate regional wall motion. The left ventricular internal cavity size was normal in size. There is no left ventricular hypertrophy. Left ventricular diastolic function could  not be evaluated. Right Ventricle: The right ventricular size is not well visualized. Right vetricular wall thickness was not assessed. Right ventricular systolic function was not well visualized. Left Atrium: Left atrial size was not well visualized. Right Atrium: Right atrial size was not well visualized. Pericardium: There is no evidence of pericardial effusion. Mitral Valve: The mitral valve is normal in structure. Moderate mitral annular calcification. Mild mitral valve regurgitation. No evidence of mitral valve stenosis. Tricuspid Valve: The tricuspid valve is normal in structure. Tricuspid valve regurgitation is mild . No evidence of tricuspid stenosis. Aortic Valve: The aortic valve is calcified. There is severe calcifcation of the aortic valve. There is moderate thickening of the aortic valve. Aortic valve regurgitation is not visualized. There is likely severe AS but study not sufficient for adequate  assessment. Pulmonic Valve: The pulmonic valve was not assessed. Aorta: The aortic root is normal in size and structure. IAS/Shunts: No atrial level shunt detected by color flow Doppler. LEFT VENTRICLE PLAX 2D LVOT diam:     1.90 cm LVOT Area:     2.84 cm   AORTA Ao Root diam: 2.80 cm  SHUNTS Systemic Diam: 1.90 cm Traci  Turner MD Electronically signed by Fransico Him MD Signature Date/Time: 10/27/2019/2:08:25 PM    Final (Updated)    [place] EKG SITE RITE  Result Date: 10/28/2019 If Site Rite image not attached, placement could not be confirmed  due to current cardiac rhythm.    Time Spent in minutes  > 30     Desiree Hane M.D on 11/02/2019 at 12:39 PM  To page go to [email or web address] - password [facility]

## 2019-11-03 ENCOUNTER — Inpatient Hospital Stay (HOSPITAL_COMMUNITY): Payer: Medicare HMO

## 2019-11-03 LAB — HEPATIC FUNCTION PANEL
ALT: 24 U/L (ref 0–44)
AST: 15 U/L (ref 15–41)
Albumin: 3 g/dL — ABNORMAL LOW (ref 3.5–5.0)
Alkaline Phosphatase: 60 U/L (ref 38–126)
Bilirubin, Direct: 0.2 mg/dL (ref 0.0–0.2)
Indirect Bilirubin: 1.2 mg/dL — ABNORMAL HIGH (ref 0.3–0.9)
Total Bilirubin: 1.4 mg/dL — ABNORMAL HIGH (ref 0.3–1.2)
Total Protein: 5.8 g/dL — ABNORMAL LOW (ref 6.5–8.1)

## 2019-11-03 LAB — BASIC METABOLIC PANEL
Anion gap: 9 (ref 5–15)
BUN: 11 mg/dL (ref 8–23)
CO2: 33 mmol/L — ABNORMAL HIGH (ref 22–32)
Calcium: 9.2 mg/dL (ref 8.9–10.3)
Chloride: 97 mmol/L — ABNORMAL LOW (ref 98–111)
Creatinine, Ser: 0.62 mg/dL (ref 0.44–1.00)
GFR calc Af Amer: >60 mL/min (ref >60)
GFR calc non Af Amer: >60 mL/min (ref >60)
Glucose, Bld: 96 mg/dL (ref 70–99)
Potassium: 4.1 mmol/L (ref 3.5–5.1)
Sodium: 139 mmol/L (ref 135–145)

## 2019-11-03 LAB — CBC
HCT: 37.9 % (ref 36.0–46.0)
Hemoglobin: 11.7 g/dL — ABNORMAL LOW (ref 12.0–15.0)
MCH: 27.3 pg (ref 26.0–34.0)
MCHC: 30.9 g/dL (ref 30.0–36.0)
MCV: 88.6 fL (ref 80.0–100.0)
Platelets: 276 10*3/uL (ref 150–400)
RBC: 4.28 MIL/uL (ref 3.87–5.11)
RDW: 15.9 % — ABNORMAL HIGH (ref 11.5–15.5)
WBC: 10.1 10*3/uL (ref 4.0–10.5)
nRBC: 0 % (ref 0.0–0.2)

## 2019-11-03 LAB — AMMONIA: Ammonia: 36 umol/L — ABNORMAL HIGH (ref 9–35)

## 2019-11-03 LAB — GLUCOSE, CAPILLARY
Glucose-Capillary: 97 mg/dL (ref 70–99)
Glucose-Capillary: 136 mg/dL — ABNORMAL HIGH (ref 70–99)

## 2019-11-03 LAB — HEPARIN LEVEL (UNFRACTIONATED)
Heparin Unfractionated: 0.72 IU/mL — ABNORMAL HIGH (ref 0.30–0.70)
Heparin Unfractionated: 0.12 IU/mL — ABNORMAL LOW (ref 0.30–0.70)
Heparin Unfractionated: 0.34 IU/mL (ref 0.30–0.70)

## 2019-11-03 MED ORDER — GUAIFENESIN ER 600 MG PO TB12
600.0000 mg | ORAL_TABLET | Freq: Two times a day (BID) | ORAL | Status: DC
  Administered 2019-11-03 – 2019-11-06 (×7): 600 mg via ORAL
  Filled 2019-11-03 (×7): qty 1

## 2019-11-03 MED ORDER — PREDNISONE 20 MG PO TABS
40.0000 mg | ORAL_TABLET | Freq: Every day | ORAL | Status: DC
  Administered 2019-11-03 – 2019-11-04 (×2): 40 mg via ORAL
  Filled 2019-11-03 (×2): qty 2

## 2019-11-03 NOTE — Progress Notes (Addendum)
[place] for heparin IV Indication: Rule out ACS/ thrombus seen on CT  No Known Allergies  Patient Measurements: Height: 5\' 5"  (165.1 cm) Weight: 83.9 kg (184 lb 15.5 oz) IBW/kg (Calculated) : 57 Heparin Dosing Weight: 73 kg  Vital Signs: Temp: 97.8 F (36.6 C) (09/18 0850) Temp Source: Oral (09/18 0850) BP: 108/43 (09/18 0850) Pulse Rate: 99 (09/18 0850)  Labs: Recent Labs    11/01/19 0241 11/01/19 0241 11/02/19 0028 11/02/19 0934 11/02/19 1430 11/03/19 0151 11/03/19 0931  HGB 10.6*   < > 10.7*  --   --  11.7*  --   HCT 34.4*  --  34.7*  --   --  37.9  --   PLT 208  --  255  --   --  276  --   HEPARINUNFRC 0.68   < > 0.72*   < > 0.23* 0.72* 0.12*  CREATININE 0.52  --  0.62  --   --  0.62  --    < > = values in this interval not displayed.    Estimated Creatinine Clearance: 74 mL/min (by C-G formula based on SCr of 0.62 mg/dL).   Assessment: IV heparin for elevated troponins and thrombus on CT (not PE). Hgb 12.1. Ddimer 5.27 on admit.  - 9/8 CT: s/p RU lobectomy. Interval development of thrombus within the most distal portion of the residual stump of the right upper lobe branch of the right pulmonary artery. No definite pulmonary embolus is noted.  Heparin level subtherapeutic (0.12) on gtt at 1100 units/hr. CBC stable. I spoke to patient and nursing. No issues with line or bleeding reported per RN. Heparin was stopped for <10 minutes 1.5 hours ago. Patient has been on similar dose of heparin for several days, but has had both low and high levels. Will increase slightly and recheck.    Goal of Therapy:  Heparin level 0.3-0.7 units/ml Monitor platelets by anticoagulation protocol: Yes   Plan:  Increase heparin IV to 1150 units/h Repeat heparin level Daily heparin level and CBC  Norina Buzzard, PharmD PGY1 Pharmacy Resident 11/03/2019 10:44 AM   Addendum Repeat level in goal at 0.34.  Continue current heparin rate  Check  daily heparin level and CBC

## 2019-11-03 NOTE — Progress Notes (Signed)
[facility]  PROGRESS NOTE  Emily Rocha IEP:[id number] DOB: 1955-08-31 DOA: 10/24/2019 PCP: Glenda Chroman, MD Admit date - 10/24/2019   Admitting Physician Bonnielee Haff, MD  Outpatient Primary MD for the patient is Glenda Chroman, MD  LOS - 10 Brief Narrative   Emily Rocha is a 64 y.o. year old female with medical history significant for COPD, coronary artery disease, adenocarcinoma of the right lung requiring VATS and right upper lobe lobectomy (2018), also newly identified hiatal hernia admitted initially with pneumonia and what appeared to be complicated right pleural effusion at [facility] subsequently transferred to [facility] on 9/11 due to acute hypoxic and hypercapnic respiratory failure related to loculated right-sided pleural effusion.  Patient was started on Unasyn, azithromycin, vancomycin due to concern for pneumonia. She underwent unsuccessful attempt at ultrasound-guided thoracentesis on 10/25/2019 due to pain.  Patient was transferred to [facility] for CT surgery evaluation in order to rule out infection/possible empyema on 9/11.   Hospital course was complicated by metabolic encephalopathy related to hypercarbia, hyponatremia, infection and hospital-acquired delirium which ultimately required Precedex and monitoring in ICU by critical care team, COPD exacerbation requiring IV steroids and scheduled inhalers and BiPAP, and incidental finding of right pulmonary artery surgical stump thrombus for which she was started on IV heparin.  She was unable to have placement of chest tube by IR on 9/13 for loculated right effusion given due to agitation related to encephalopathy    Subjective  Today feels well. Mild cough, not very productive.  Still tolerating diet.  Having BMs.  A & P  Acute hypoxic/hypercarbic respiratory failure, multifactorial etiology including  pneumonia complicated by loculated right-sided pleural effusion, COPD exacerbation, improving   Reviewed prior CT imaging and CXR and repeat CHT chest and CXR today shows pleural effusion essentially resolved without any need for intervention, Stable on 2 L O2 saturations > 95% -Continue inhaler therapy and transition to oral prednisone for COPD -Antibiotics for presumed aspiration pneumonia in setting of hiatal hernia (Unasyn, vancomycin day 7--given positive MRSA PCR screen) -Wean O2, goal SPO2 greater than 88%, pulmonary toilet with incentive spirometry and flutter valve. Ambulatory o2 testing in next 24 hours  Loculated right-sided pleural effusion, improved.  Presumed to be possible empyema however patient was never able to have any chest tube placed due to agitation/metabolic encephalopathy. Repeat CT chest on 9/17 shows significant reduction with only residual trace pleural effusion too small for chest tube placement -CT surgery agrees no need for chest tube placement and recommends continue pulmonary toilet -Continue to monitor respiratory status  Aspiration pneumonia in the setting of hiatal hernia versus bilateral pneumonia given bilateral opacities on prior chest x-rays. Blood cultures negative. Sputum culture no growth. MRSA PCR positive on 9/9.  Patient has been on Unasyn since 9/8 and vancomycin since 9/10. -afebrile, no leukocytosis, essentially resolved effusion, still doing well on oral Augmentin to cover for both aspiration pneumonia -Discontinued vancomycin on 9/17 closely monitor respiratory status -Incentive spirometer, flutter valve, continue out of bed to chair  Acute pulmonary artery thrombus, incidentally found in right pulmonary artery in distal portion of residual stump of RUL branch of right pulmonary artery on CTA chest, no PE noted -Continue IV heparin fornow -Would warrant systemic anticoagulant for next 3 to 6 months, as recommended by pulmonology -- of note per chart review has not tolerated DOAC in the past ( for postop afib, per Dr. Harl Bowie)  COPD, stable.  No wheezing -Emily Rocha  twice daily,  -- IV solumedrol 20 mg today BID switch to oral prednisone 40mg  today  CHF with preserved EF, grade 1 diastolic dysfunction Aortic stenosis, history of moderate ( degree not able to be determined on limited TTE on 9/11) euvolemic on exam -Torsemide --followed by Dr Leward Quan as outpatient --repeat full TTE now that patient cooperative  Hypokalemia, resolved Mg wnl -Replete as needed -Monitor BMP  Agitation, likely hospital-acquired delirium due to prolonged hospitalization further complicated by metabolic insults (metabolic/infectious encephalopathy including hypercarbia, infection, hyponatremia), resolved. Alert and oriented x4.  -Precedex drip discontinued on 9/17 this am -Continue Seroquel, Xanax 3 times daily (home medication) -Weaning steroids  CAD (prior CABG 2018) stable --continue aspirin, Imdur   Hiatal hernia  -Repair to be discussed as an outpatient --continue PPI, transitioned to oral  Remote history of adenocarcinoma the lung status post right upper lobe lobectomy (2018) -Continue outpatient follow-up and surveillance   Family Communication  :  none  Code Status :  full  Disposition Plan  :  Patient is from [place]. Anticipated d/c date: 66 to 3 days. Barriers to d/c or necessity for inpatient status: wean O2 to room air, repeat TTE for evaluation of AS, , need plan for anticoagulation for pulmonary thrombus still on iv heparin currently Consults  :  Ct SURGERY. PCCM, IR  Procedures  :  none  DVT Prophylaxis  :  Heparin drip  Lab Results  Component Value Date   PLT 276 11/03/2019    Diet :  Diet Order            Diet heart healthy/carb modified Room service appropriate? Yes; Fluid consistency: Thin  Diet effective now                  Inpatient Medications Scheduled Meds:  ALPRAZolam  0.25 mg Oral TID   amoxicillin-clavulanate  1 tablet Oral Q12H   aspirin EC  81 mg Oral Daily   Chlorhexidine Gluconate Cloth  6  each Topical Daily   folic acid  1 mg Intravenous Daily   influenza vaccine adjuvanted  0.5 mL Intramuscular Tomorrow-1000   isosorbide mononitrate  15 mg Oral Daily   methylPREDNISolone (SOLU-MEDROL) injection  20 mg Intravenous Daily   metoprolol tartrate  25 mg Oral BID   mometasone-formoterol  2 puff Inhalation BID   pantoprazole  40 mg Oral Daily   QUEtiapine  25 mg Oral QHS   sodium chloride flush  10-40 mL Intracatheter Q12H   thiamine injection  100 mg Intravenous Daily   torsemide  20 mg Oral Daily   Continuous Infusions:  heparin 1,150 Units/hr (11/03/19 1055)   PRN Meds:.acetaminophen **OR** acetaminophen, albuterol, bisacodyl, bisacodyl, [DISCONTINUED] ondansetron **OR** ondansetron (ZOFRAN) IV, sodium chloride flush  Antibiotics  :   Anti-infectives (From admission, onward)   Start     Dose/Rate Route Frequency Ordered Stop   11/02/19 2200  amoxicillin-clavulanate (AUGMENTIN) 875-125 MG per tablet 1 tablet        1 tablet Oral Every 12 hours 11/02/19 1650     10/26/19 0600  vancomycin (VANCOCIN) IVPB 1000 mg/200 mL premix  Status:  Discontinued        1,000 mg 200 mL/hr over 60 Minutes Intravenous Every 12 hours 10/25/19 1654 11/02/19 1650   10/25/19 1715  vancomycin (VANCOREADY) IVPB 2000 mg/400 mL        2,000 mg 200 mL/hr over 120 Minutes Intravenous  Once 10/25/19 1652 10/25/19 2000   10/24/19 2030  Ampicillin-Sulbactam (UNASYN) 3  g in sodium chloride 0.9 % 100 mL IVPB  Status:  Discontinued        3 g 200 mL/hr over 30 Minutes Intravenous Every 8 hours 10/24/19 1949 11/02/19 1650   10/24/19 2000  azithromycin (ZITHROMAX) tablet 500 mg  Status:  Discontinued        500 mg Oral Daily 10/24/19 1923 10/28/19 1944       Objective   Vitals:   11/03/19 0721 11/03/19 0722 11/03/19 0850 11/03/19 1222  BP:  (!) 112/41 (!) 108/43 122/61  Pulse: (!) 106 (!) 102 99 84  Resp: (!) 21 20 20 20   Temp:  97.7 F (36.5 C) 97.8 F (36.6 C) 97.7 F (36.5 C)    TempSrc:  Oral Oral Oral  SpO2: 99% 96% 96% 95%  Weight:      Height:        SpO2: 95 % O2 Flow Rate (L/min): 1 L/min FiO2 (%): 40 %  Wt Readings from Last 3 Encounters:  11/03/19 83.9 kg  10/18/19 85.8 kg  10/01/19 85.7 kg     Intake/Output Summary (Last 24 hours) at 11/03/2019 1249 Last data filed at 11/03/2019 0700 Gross per 24 hour  Intake 342.58 ml  Output 600 ml  Net -257.42 ml    Physical Exam:     Awake Alert, Oriented X 3, Normal affect No new F.N deficits,  [place].AT, Normal respiratory effort on 2 L, rhonchorous RRR,Loud SEM +ve B.Sounds, Abd Soft, No tenderness, No rebound, guarding or rigidity. No Cyanosis, No new Rash or bruise   I have personally reviewed the following:   Data Reviewed:  CBC Recent Labs  Lab 10/30/19 0105 10/30/19 0105 10/30/19 1218 10/31/19 0605 11/01/19 0241 11/02/19 0028 11/03/19 0151  WBC 7.2  --   --  8.0 8.2 9.1 10.1  HGB 9.5*   < > 10.5* 10.5* 10.6* 10.7* 11.7*  HCT 31.7*   < > 31.0* 34.2* 34.4* 34.7* 37.9  PLT 190  --   --  154 208 255 276  MCV 88.3  --   --  87.9 87.1 87.0 88.6  MCH 26.5  --   --  27.0 26.8 26.8 27.3  MCHC 30.0  --   --  30.7 30.8 30.8 30.9  RDW 15.3  --   --  15.3 15.2 15.8* 15.9*   < > = values in this interval not displayed.    Chemistries  Recent Labs  Lab 10/28/19 0232 10/28/19 1355 10/29/19 0555 10/30/19 1155 10/30/19 1155 10/30/19 1218 10/31/19 0605 11/01/19 0241 11/02/19 0028 11/02/19 0934 11/02/19 1430 11/03/19 0151  NA 134*  --    < > 130*   < > 134* 135 136 135  --   --  139  K 5.1  --    < > 5.6*   < > 4.8 4.3 4.0 3.0*  --   --  4.1  CL 95*  --    < > 93*  --   --  94* 89* 89*  --   --  97*  CO2 33*  --    < > 28  --   --  32 35* 37*  --   --  33*  GLUCOSE 111*  --    < > 133*  --   --  110* 92 89  --   --  96  BUN 20  --    < > 21  --   --  16 13 11   --   --  11  CREATININE 0.63  --    < > 0.54  --   --  0.60 0.52 0.62  --   --  0.62  CALCIUM 8.2*  --    < > 7.9*   --   --  8.0* 8.3* 8.4*  --   --  9.2  MG  --  2.9*  --   --   --   --  2.1  --   --  1.8 1.7  --   AST 22  --   --   --   --   --  22  --   --   --   --   --   ALT 60*  --   --   --   --   --  33  --   --   --   --   --   ALKPHOS 71  --   --   --   --   --  50  --   --   --   --   --   BILITOT 1.0  --   --   --   --   --  1.9*  --   --   --   --   --    < > = values in this interval not displayed.   ------------------------------------------------------------------------------------------------------------------ No results for input(s): CHOL, HDL, LDLCALC, TRIG, CHOLHDL, LDLDIRECT in the last 72 hours.  Lab Results  Component Value Date   HGBA1C 6.2 (H) 12/22/2017   ------------------------------------------------------------------------------------------------------------------ No results for input(s): TSH, T4TOTAL, T3FREE, THYROIDAB in the last 72 hours.  Invalid input(s): FREET3 ------------------------------------------------------------------------------------------------------------------ No results for input(s): VITAMINB12, FOLATE, FERRITIN, TIBC, IRON, RETICCTPCT in the last 72 hours.  Coagulation profile No results for input(s): INR, PROTIME in the last 168 hours.  No results for input(s): DDIMER in the last 72 hours.  Cardiac Enzymes No results for input(s): CKMB, TROPONINI, MYOGLOBIN in the last 168 hours.  Invalid input(s): CK ------------------------------------------------------------------------------------------------------------------    Component Value Date/Time   BNP 905.0 (H) 10/25/2019 2133    Micro Results Recent Results (from the past 240 hour(s))  SARS Coronavirus 2 by RT PCR (hospital order, performed in [facility] hospital lab) Nasopharyngeal Nasopharyngeal Swab     Status: None   Collection Time: 10/24/19  1:12 PM   Specimen: Nasopharyngeal Swab  Result Value Ref Range Status   SARS Coronavirus 2 NEGATIVE NEGATIVE Final    Comment:  (NOTE) SARS-CoV-2 target nucleic acids are NOT DETECTED.  The SARS-CoV-2 RNA is generally detectable in upper and lower respiratory specimens during the acute phase of infection. The lowest concentration of SARS-CoV-2 viral copies this assay can detect is 250 copies / mL. A negative result does not preclude SARS-CoV-2 infection and should not be used as the sole basis for treatment or other patient management decisions.  A negative result may occur with improper specimen collection / handling, submission of specimen other than nasopharyngeal swab, presence of viral mutation(s) within the areas targeted by this assay, and inadequate number of viral copies (<250 copies / mL). A negative result must be combined with clinical observations, patient history, and epidemiological information.  Fact Sheet for Patients:   StrictlyIdeas.no  Fact Sheet for Healthcare Providers: BankingDealers.co.za  This test is not yet approved or  cleared by the [place] FDA and has been authorized for detection and/or diagnosis of SARS-CoV-2 by FDA under an Emergency Use Authorization (EUA).  This EUA will remain  in effect (meaning this test can be used) for the duration of the COVID-19 declaration under Section 564(b)(1) of the Act, 21 U.S.C. section 360bbb-3(b)(1), unless the authorization is terminated or revoked sooner.  Performed at [facility], [address] [place], [place] [address]   Blood Culture (routine x 2)     Status: None   Collection Time: 10/24/19  1:57 PM   Specimen: Right Antecubital; Blood  Result Value Ref Range Status   Specimen Description   Final    RIGHT ANTECUBITAL BOTTLES DRAWN AEROBIC AND ANAEROBIC   Special Requests Blood Culture adequate volume  Final   Culture   Final    NO GROWTH 5 DAYS Performed at [facility], [address] [place], [place] [address]    Report Status 10/29/2019 FINAL  Final  Blood Culture  (routine x 2)     Status: None   Collection Time: 10/24/19  1:57 PM   Specimen: Right Antecubital; Blood  Result Value Ref Range Status   Specimen Description   Final    RIGHT ANTECUBITAL BOTTLES DRAWN AEROBIC AND ANAEROBIC   Special Requests Blood Culture adequate volume  Final   Culture   Final    NO GROWTH 5 DAYS Performed at [facility], [address], [place], [place] [address]    Report Status 10/29/2019 FINAL  Final  MRSA PCR Screening     Status: Abnormal   Collection Time: 10/25/19 11:18 AM   Specimen: Nasal Mucosa; Nasopharyngeal  Result Value Ref Range Status   MRSA by PCR POSITIVE (A) NEGATIVE Final    Comment:        The GeneXpert MRSA Assay (FDA approved for NASAL specimens only), is one component of a comprehensive MRSA colonization surveillance program. It is not intended to diagnose MRSA infection nor to guide or monitor treatment for MRSA infections. RESULT CALLED TO, READ BACK BY AND VERIFIED WITH: MURPHY,[email or web address]  BY MATTHEWS, B 9.9.2021 Performed at [facility], [address], [place], [place] [address]     Radiology Reports CT HEAD WO CONTRAST  Result Date: 10/25/2019 CLINICAL DATA:  64 year old female with hypoxia and altered mental status. EXAM: CT HEAD WITHOUT CONTRAST TECHNIQUE: Contiguous axial images were obtained from the base of the skull through the vertex without intravenous contrast. COMPARISON:  Head CT 09/28/2017. FINDINGS: Brain: No midline shift, ventriculomegaly, mass effect, evidence of mass lesion, intracranial hemorrhage or evidence of cortically based acute infarction. Scattered, patchy bilateral cerebral white matter hypodensity appears not significantly changed. Otherwise gray-white matter differentiation remains within normal limits. Vascular: Calcified atherosclerosis at the skull base. Skull: No acute osseous abnormality identified. Sinuses/Orbits: Visualized paranasal sinuses and mastoids are stable and well pneumatized. Other:  Visualized orbits and scalp soft tissues are within normal limits. IMPRESSION: 1. No acute intracranial abnormality by CT. 2. Patchy chronic white matter changes, most commonly due to chronic small vessel disease. Electronically Signed   By: Genevie Ann M.D.   On: 10/25/2019 16:10   CT Angio Chest PE W and/or Wo Contrast  Result Date: 10/24/2019 CLINICAL DATA:  Chest pain. EXAM: CT ANGIOGRAPHY CHEST WITH CONTRAST TECHNIQUE: Multidetector CT imaging of the chest was performed using the standard protocol during bolus administration of intravenous contrast. Multiplanar CT image reconstructions and MIPs were obtained to evaluate the vascular anatomy. CONTRAST:  112mL OMNIPAQUE IOHEXOL 350 MG/ML SOLN COMPARISON:  February 27, 2019. FINDINGS: Cardiovascular: Atherosclerosis of thoracic aorta is noted without aneurysm or dissection. Status post coronary bypass graft. The patient is status post right  upper lobectomy. There is the interval development of thrombus within the most distal portion of the stump of the right upper lobe branch of the right pulmonary artery. No definite embolus is noted. Mild cardiomegaly is noted. No pericardial effusion is noted. Mediastinum/Nodes: Moderate size sliding-type hiatal hernia is noted. Thyroid gland is unremarkable. No adenopathy is noted. Lungs/Pleura: No pneumothorax is noted. Left lung is clear. Moderate size loculated right pleural effusion is noted with associated subsegmental atelectasis of the right lower lobe. Status post right upper lobectomy as described above. Upper Abdomen: No acute abnormality. Musculoskeletal: No chest wall abnormality. No acute or significant osseous findings. Review of the MIP images confirms the above findings. IMPRESSION: 1. Status post right upper lobectomy. Interval development of thrombus within the most distal portion of the residual stump of the right upper lobe branch of the right pulmonary artery. No definite pulmonary embolus is noted. 2.  Moderate size loculated right pleural effusion is noted with associated subsegmental atelectasis of the right lower lobe. 3. Moderate size sliding-type hiatal hernia. 4. Aortic atherosclerosis. Aortic Atherosclerosis (ICD10-I70.0). Electronically Signed   By: Marijo Conception M.D.   On: 10/24/2019 15:47   [place] CHEST (PLEURAL EFFUSION)  Result Date: 10/25/2019 CLINICAL DATA:  RIGHT pleural effusion by CT, history of COPD, GERD, RIGHT upper lobe lung cancer post upper lobectomy EXAM: CHEST ULTRASOUND COMPARISON:  CT angio chest 10/24/2019 FINDINGS: Small pleural effusion identified at the RIGHT lung base. Effusion is partially loculated. Atelectatic RIGHT lower lobe is adherent to the posterior chest wall and prevents safe access of the inferior thoracic cavity for thoracentesis. This appearance was seen on the prior CT but appears fixed with change in position on ultrasound. IMPRESSION: Small loculated RIGHT pleural effusion, unable to safely access for thoracentesis. Electronically Signed   By: Lavonia Dana M.D.   On: 10/25/2019 11:00   CT CHEST LIMITED WO CONTRAST  Result Date: 11/02/2019 INDICATION: Loculated right-sided pleural effusion. Please perform image guided chest tube placement for infection source control purposes. Note, there is attempt earlier this week to perform image guided chest tube placement however the patient could not tolerate the examination secondary to altered mental status. EXAM: LIMITED CHEST CT COMPARISON:  Chest CT-10/24/2019 MEDICATIONS: None ANESTHESIA/SEDATION: None CONTRAST:  None COMPLICATIONS: None immediate. PROCEDURE: Informed written consent was obtained from the patient after a discussion of the risks, benefits and alternatives to treatment. The patient was placed supine, slightly LPO on the CT gantry and a pre procedural CT was performed demonstrating significant reduction, near complete resolution of residual trace right-sided pleural effusion with minimal amount of fluid  seen at the level of the right lung apex and posteromedial aspect of the right lung base, too small to warrant CT-guided chest tube placement. Above was discussed with referring cardiothoracic surgeon, Dr. Kipp Brood, the decision was made not to proceed with intervention at this time. IMPRESSION: Significant reduction/near resolution of residual trace right-sided effusion, too small to warrant CT-guided chest tube placement. No procedure performed. Electronically Signed   By: Sandi Mariscal M.D.   On: 11/02/2019 10:43   DG CHEST PORT 1 VIEW  Result Date: 11/02/2019 CLINICAL DATA:  Pleural effusion EXAM: PORTABLE CHEST 1 VIEW COMPARISON:  10/31/2019 FINDINGS: Prior median sternotomy and CABG. Heart is normal size. Bibasilar airspace opacities, right greater than left with some improvement on the right since prior study. No effusions or pneumothorax. IMPRESSION: Bilateral lower lobe airspace opacities, right greater than left with some improvement in aeration at the right base  since prior study. Electronically Signed   By: Rolm Baptise M.D.   On: 11/02/2019 11:22   DG CHEST PORT 1 VIEW  Result Date: 10/31/2019 CLINICAL DATA:  Shortness of breath EXAM: PORTABLE CHEST 1 VIEW COMPARISON:  October 29, 2019 chest radiograph and chest CT October 24, 2019 FINDINGS: There is a persistent fairly small right pleural effusion. There is ill-defined airspace opacity in the right base with apparent postoperative change in this area. Elsewhere, there is mild interstitial edema. There is cardiomegaly with pulmonary venous hypertension. Patient is status post internal mammary bypass grafting. No adenopathy. There is calcification in the aortic arch as well as in the left carotid artery. IMPRESSION: Cardiomegaly with pulmonary vascular congestion. Interstitial pulmonary edema with right pleural effusion. These are findings felt to be indicative of a degree of underlying congestive heart failure. There is superimposed airspace  opacity in the right base which may represent alveolar edema or a degree of superimposed pneumonia. Postoperative changes noted. There is left carotid artery calcification. Aortic Atherosclerosis (ICD10-I70.0). Electronically Signed   By: Lowella Grip III M.D.   On: 10/31/2019 08:11   DG CHEST PORT 1 VIEW  Result Date: 10/29/2019 CLINICAL DATA:  Shortness of breath, respiratory failure, pleural effusion EXAM: PORTABLE CHEST 1 VIEW COMPARISON:  CTA chest dated 10/24/2019 FINDINGS: Small to moderate right pleural effusion, possibly mildly improved. Associated right lower lobe opacity, likely atelectasis. Postsurgical changes in the right upper hemithorax. Left lung is clear. No frank interstitial edema. Cardiomegaly. Postsurgical changes related to prior CABG. Median sternotomy. IMPRESSION: Small to moderate right pleural effusion, possibly mildly improved. Postsurgical changes in the right upper hemithorax. Electronically Signed   By: Julian Hy M.D.   On: 10/29/2019 08:16   DG Chest Portable 1 View  Result Date: 10/24/2019 CLINICAL DATA:  Shortness of breath with fever and chills EXAM: PORTABLE CHEST 1 VIEW COMPARISON:  May 25, 2019 FINDINGS: There is a small right pleural effusion. There is postoperative change with scarring on the right. There is no edema or airspace opacity. Heart is upper normal in size with pulmonary vascularity normal. Patient is status post internal mammary bypass grafting. No adenopathy. No bone lesions. IMPRESSION: Small right pleural effusion. Scarring with volume loss on the right. No frank edema or airspace opacity. Stable cardiac silhouette. Postoperative changes noted. Electronically Signed   By: Lowella Grip III M.D.   On: 10/24/2019 13:32   CT IMAGE GUIDED DRAINAGE BY PERCUTANEOUS CATHETER  Result Date: 11/02/2019 INDICATION: Loculated right-sided pleural effusion. Please perform image guided chest tube placement for infection source control purposes.  Note, there is attempt earlier this week to perform image guided chest tube placement however the patient could not tolerate the examination secondary to altered mental status. EXAM: LIMITED CHEST CT COMPARISON:  Chest CT-10/24/2019 MEDICATIONS: None ANESTHESIA/SEDATION: None CONTRAST:  None COMPLICATIONS: None immediate. PROCEDURE: Informed written consent was obtained from the patient after a discussion of the risks, benefits and alternatives to treatment. The patient was placed supine, slightly LPO on the CT gantry and a pre procedural CT was performed demonstrating significant reduction, near complete resolution of residual trace right-sided pleural effusion with minimal amount of fluid seen at the level of the right lung apex and posteromedial aspect of the right lung base, too small to warrant CT-guided chest tube placement. Above was discussed with referring cardiothoracic surgeon, Dr. Kipp Brood, the decision was made not to proceed with intervention at this time. IMPRESSION: Significant reduction/near resolution of residual trace right-sided effusion, too  small to warrant CT-guided chest tube placement. No procedure performed. Electronically Signed   By: Sandi Mariscal M.D.   On: 11/02/2019 10:43   VAS [place] LOWER EXTREMITY VENOUS (DVT)  Result Date: 10/29/2019  Lower Venous DVTStudy Indications: Pulmonary embolism, and See Previous Cat Scan.  Risk Factors: Suspected PE. Limitations: Limited due to patient movement. Performing Technologist: Griffin Basil RCT RDMS  Examination Guidelines: A complete evaluation includes B-mode imaging, spectral Doppler, color Doppler, and power Doppler as needed of all accessible portions of each vessel. Bilateral testing is considered an integral part of a complete examination. Limited examinations for reoccurring indications may be performed as noted. The reflux portion of the exam is performed with the patient in reverse Trendelenburg.   +---------+---------------+---------+-----------+----------+--------------+  RIGHT     Compressibility Phasicity Spontaneity Properties Thrombus Aging  +---------+---------------+---------+-----------+----------+--------------+  CFV       Full            Yes       Yes                                    +---------+---------------+---------+-----------+----------+--------------+  SFJ       Full                                                             +---------+---------------+---------+-----------+----------+--------------+  FV Prox   Full                                                             +---------+---------------+---------+-----------+----------+--------------+  FV Mid    Full                                                             +---------+---------------+---------+-----------+----------+--------------+  FV Distal Full                                                             +---------+---------------+---------+-----------+----------+--------------+  PFV       Full                                                             +---------+---------------+---------+-----------+----------+--------------+  POP       Full                      Yes                                    +---------+---------------+---------+-----------+----------+--------------+  PTV       Full                                                             +---------+---------------+---------+-----------+----------+--------------+  PERO      Full                                                             +---------+---------------+---------+-----------+----------+--------------+   +---------+---------------+---------+-----------+----------+------------------+  LEFT      Compressibility Phasicity Spontaneity Properties Thrombus Aging      +---------+---------------+---------+-----------+----------+------------------+  CFV       Full            Yes       Yes                                         +---------+---------------+---------+-----------+----------+------------------+  SFJ       Full                                                                 +---------+---------------+---------+-----------+----------+------------------+  FV Prox   Full                                                                 +---------+---------------+---------+-----------+----------+------------------+  FV Mid    Full                                                                 +---------+---------------+---------+-----------+----------+------------------+  FV Distal Full                                                                 +---------+---------------+---------+-----------+----------+------------------+  PFV       Full                                                                 +---------+---------------+---------+-----------+----------+------------------+  POP       Full                                                                 +---------+---------------+---------+-----------+----------+------------------+  PTV       Full                                                                 +---------+---------------+---------+-----------+----------+------------------+  PERO      Full                                             Limited                                                                         visualization       +---------+---------------+---------+-----------+----------+------------------+     Summary: RIGHT: - There is no evidence of deep vein thrombosis in the lower extremity.  - No cystic structure found in the popliteal fossa.  LEFT: - There is no evidence of deep vein thrombosis in the lower extremity.  - No cystic structure found in the popliteal fossa.  *See table(s) above for measurements and observations. Electronically signed by Servando Snare MD on 10/29/2019 at 1:01:37 PM.    Final    ECHOCARDIOGRAM LIMITED  Result Date: 10/27/2019    ECHOCARDIOGRAM LIMITED REPORT   Patient  Name:   ZANDREA KENEALY Date of Exam: 10/27/2019 Medical Rec #:  [id number]    Height:       65.0 in Accession #:    [id number]   Weight:       196.6 lb Date of Birth:  Dec 21, 1955    BSA:          1.964 m Patient Age:    28 years     BP:           148/66 mmHg Patient Gender: F            HR:           84 bpm. Exam Location:  Inpatient Procedure: Limited Echo, Limited Color Doppler and Cardiac Doppler Indications:     Dyspnea 786.09 / R06.00  History:         Patient has prior history of Echocardiogram examinations, most                  recent 08/10/2019. CAD; Prior CABG. Previous Myocardial                  Infarction, Prior                  CABG, COPD; Signs/Symptoms:Shortness of Breath. 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                  Parapneumonic right-sided effusion.  Sonographer:     Darlina Sicilian RDCS Referring Phys:  [id number] COURAGE EMOKPAE Diagnosing Phys: Fransico Him MD  Sonographer Comments: Image acquisition challenging due to patient behavioral  factors. IMPRESSIONS  1. Left ventricular endocardial border not optimally defined to evaluate regional wall motion. Left ventricular diastolic function could not be evaluated.Patient was not cooperative with exam.  2. Right ventricular systolic function was not well visualized. The right ventricular size is not well visualized.  3. The mitral valve is normal in structure. Mild mitral valve regurgitation. No evidence of mitral stenosis. Moderate mitral annular calcification.  4. The aortic valve is calcified. There is severe calcifcation of the aortic valve. There is moderate thickening of the aortic valve. Aortic valve regurgitation is not visualized. There is likely severe AS but study not sufficient for adequate assessment.  5. Recommend repeat 2D echo full study once patient able to cooperate. FINDINGS  Left Ventricle: Left ventricular endocardial border not optimally defined to evaluate regional wall motion.  The left ventricular internal cavity size was normal in size. There is no left ventricular hypertrophy. Left ventricular diastolic function could  not be evaluated. Right Ventricle: The right ventricular size is not well visualized. Right vetricular wall thickness was not assessed. Right ventricular systolic function was not well visualized. Left Atrium: Left atrial size was not well visualized. Right Atrium: Right atrial size was not well visualized. Pericardium: There is no evidence of pericardial effusion. Mitral Valve: The mitral valve is normal in structure. Moderate mitral annular calcification. Mild mitral valve regurgitation. No evidence of mitral valve stenosis. Tricuspid Valve: The tricuspid valve is normal in structure. Tricuspid valve regurgitation is mild . No evidence of tricuspid stenosis. Aortic Valve: The aortic valve is calcified. There is severe calcifcation of the aortic valve. There is moderate thickening of the aortic valve. Aortic valve regurgitation is not visualized. There is likely severe AS but study not sufficient for adequate  assessment. Pulmonic Valve: The pulmonic valve was not assessed. Aorta: The aortic root is normal in size and structure. IAS/Shunts: No atrial level shunt detected by color flow Doppler. LEFT VENTRICLE PLAX 2D LVOT diam:     1.90 cm LVOT Area:     2.84 cm   AORTA Ao Root diam: 2.80 cm  SHUNTS Systemic Diam: 1.90 cm Fransico Him MD Electronically signed by Fransico Him MD Signature Date/Time: 10/27/2019/2:08:25 PM    Final (Updated)    [place] EKG SITE RITE  Result Date: 10/28/2019 If Site Rite image not attached, placement could not be confirmed due to current cardiac rhythm.    Time Spent in minutes  > 30     Desiree Hane M.D on 11/03/2019 at 12:49 PM  To page go to [email or web address] - password [facility]

## 2019-11-03 NOTE — Progress Notes (Signed)
[place] for heparin IV Indication: Rule out ACS/ thrombus seen on CT  No Known Allergies  Patient Measurements: Height: 5\' 5"  (165.1 cm) Weight: 83.9 kg (184 lb 15.5 oz) IBW/kg (Calculated) : 57 Heparin Dosing Weight: 73 kg  Vital Signs: Temp: 98.4 F (36.9 C) (09/17 2324) Temp Source: Oral (09/17 2324) BP: 129/64 (09/17 2324) Pulse Rate: 78 (09/17 2324)  Labs: Recent Labs    11/01/19 0241 11/01/19 0241 11/02/19 0028 11/02/19 0028 11/02/19 0934 11/02/19 1430 11/03/19 0151  HGB 10.6*   < > 10.7*  --   --   --  11.7*  HCT 34.4*  --  34.7*  --   --   --  37.9  PLT 208  --  255  --   --   --  276  HEPARINUNFRC 0.68   < > 0.72*   < > 0.15* 0.23* 0.72*  CREATININE 0.52  --  0.62  --   --   --  0.62   < > = values in this interval not displayed.    Estimated Creatinine Clearance: 74 mL/min (by C-G formula based on SCr of 0.62 mg/dL).   Assessment: IV heparin for elevated troponins and thrombus on CT (not PE). Hgb 12.1. Ddimer 5.27 on admit.  - 9/8 CT: s/p RU lobectomy. Interval development of thrombus within the most distal portion of the residual stump of the right upper lobe branch of the right pulmonary artery. No definite pulmonary embolus is noted.  Heparin level slightly supratherapeutic (0.72) on gtt at 1150 units/hr. No issues with line or bleeding reported per RN.   Goal of Therapy:  Heparin level 0.3-0.7 units/ml Monitor platelets by anticoagulation protocol: Yes   Plan:  Decrease heparin IV to 1100 units/h 6 hour heparin level  Sherlon Handing, PharmD, BCPS Please see amion for complete clinical pharmacist phone list 11/03/2019 3:29 AM

## 2019-11-03 NOTE — Progress Notes (Signed)
SATURATION QUALIFICATIONS: (This note is used to comply with regulatory documentation for home oxygen)  Patient Saturations on Room Air at Rest = 90-91 %  Patient Saturations on Room Air while Ambulating = 84 %  Patient Saturations on 2 Liters of oxygen while Ambulating = 90-92 %  Please briefly explain why patient needs home oxygen:  Patient's oxygen saturations dipped to 84 % on RA while ambulating 240 ft.  When placed on 2L oxygen [place] while ambulating, SpO2 90-92%.  At rest, upon return to room patient was 88-89 % SpO2 at rest, and 95 % SpO2 on 1 L [place].

## 2019-11-04 ENCOUNTER — Inpatient Hospital Stay (HOSPITAL_COMMUNITY): Payer: Medicare HMO

## 2019-11-04 DIAGNOSIS — I350 Nonrheumatic aortic (valve) stenosis: Secondary | ICD-10-CM

## 2019-11-04 DIAGNOSIS — I351 Nonrheumatic aortic (valve) insufficiency: Secondary | ICD-10-CM

## 2019-11-04 LAB — CBC
HCT: 35.6 % — ABNORMAL LOW (ref 36.0–46.0)
Hemoglobin: 10.5 g/dL — ABNORMAL LOW (ref 12.0–15.0)
MCH: 26.2 pg (ref 26.0–34.0)
MCHC: 29.5 g/dL — ABNORMAL LOW (ref 30.0–36.0)
MCV: 88.8 fL (ref 80.0–100.0)
Platelets: 201 10*3/uL (ref 150–400)
RBC: 4.01 MIL/uL (ref 3.87–5.11)
RDW: 16 % — ABNORMAL HIGH (ref 11.5–15.5)
WBC: 8.4 10*3/uL (ref 4.0–10.5)
nRBC: 0 % (ref 0.0–0.2)

## 2019-11-04 LAB — ECHOCARDIOGRAM COMPLETE
AR max vel: 0.93 cm2
AV Area VTI: 1.03 cm2
AV Area mean vel: 0.91 cm2
AV Mean grad: 37 mmHg
AV Peak grad: 61.8 mmHg
Ao pk vel: 3.93 m/s
Area-P 1/2: 2.99 cm2
Height: 65 in
S' Lateral: 3.3 cm
Weight: 2915.2 oz

## 2019-11-04 LAB — GLUCOSE, CAPILLARY
Glucose-Capillary: 106 mg/dL — ABNORMAL HIGH (ref 70–99)
Glucose-Capillary: 167 mg/dL — ABNORMAL HIGH (ref 70–99)
Glucose-Capillary: 187 mg/dL — ABNORMAL HIGH (ref 70–99)

## 2019-11-04 LAB — HEPARIN LEVEL (UNFRACTIONATED): Heparin Unfractionated: 1.01 IU/mL — ABNORMAL HIGH (ref 0.30–0.70)

## 2019-11-04 MED ORDER — APIXABAN 5 MG PO TABS
10.0000 mg | ORAL_TABLET | Freq: Two times a day (BID) | ORAL | Status: DC
  Administered 2019-11-04 – 2019-11-05 (×2): 10 mg via ORAL
  Filled 2019-11-04 (×3): qty 2

## 2019-11-04 MED ORDER — APIXABAN 5 MG PO TABS: 5.0000 mg | ORAL_TABLET | Freq: Two times a day (BID) | ORAL | Status: DC

## 2019-11-04 NOTE — Progress Notes (Signed)
[facility]  PROGRESS NOTE  Emily Rocha BMW:[id number] DOB: [birth year] DOA: 10/24/2019 PCP: Glenda Chroman, MD Admit date - 10/24/2019   Admitting Physician Bonnielee Haff, MD  Outpatient Primary MD for the patient is Glenda Chroman, MD  LOS - 11 Brief Narrative   Emily Rocha is a 64 y.o. year old female with medical history significant for COPD, coronary artery disease, adenocarcinoma of the right lung requiring VATS and right upper lobe lobectomy (2018), also newly identified hiatal hernia admitted initially with pneumonia and what appeared to be complicated right pleural effusion at [facility] subsequently transferred to [facility] on 9/11 due to acute hypoxic and hypercapnic respiratory failure related to loculated right-sided pleural effusion.  Patient was started on Unasyn, azithromycin, vancomycin due to concern for pneumonia. She underwent unsuccessful attempt at ultrasound-guided thoracentesis on 10/25/2019 due to pain.  Patient was transferred to [facility] for CT surgery evaluation in order to rule out infection/possible empyema on 9/11.   Hospital course was complicated by metabolic encephalopathy related to hypercarbia, hyponatremia, infection and hospital-acquired delirium which ultimately required Precedex and monitoring in ICU by critical care team, COPD exacerbation requiring IV steroids and scheduled inhalers and BiPAP, and incidental finding of right pulmonary artery surgical stump thrombus for which she was started on IV heparin.  She was unable to have placement of chest tube by IR on 9/13 for loculated right effusion given due to agitation related to encephalopathy    Subjective  Long conversation with patient and her husband regarding overall progress during hospital stay neck steps and plan.  Patient reports cough and good production, denies any fevers, no chills, no chest pain.  Husband agrees that from an status standpoint she is improved  significantly.  They both confirm that she has been on Coumadin in the past. A & P  Acute hypoxic/hypercarbic respiratory failure, multifactorial etiology including  pneumonia complicated by loculated right-sided pleural effusion, COPD exacerbation, improving  Stable on 2 L O2 saturations > 95%.  Underwent amatory O2 testing and showed oxygen desaturation without supplemental O2 -Continue Dulera, oral prednisone burst for COPD portion -Augmentin for presumed aspiration pneumonia in setting of hiatal hernia (Unasyn, vancomycin day 7--given positive MRSA PCR screen--now discontinued) -Wean O2, goal SPO2 greater than 88%, pulmonary toilet with incentive spirometry and flutter valve. Ambulatory o2 testing in next 24 hours  Loculated right-sided pleural effusion, improved.  Presumed to be possible empyema however patient was never able to have any chest tube placed due to agitation/metabolic encephalopathy. Repeat CT chest on 9/17 shows significant reduction with only residual trace pleural effusion too small for chest tube placement -CT surgery agrees no need for chest tube placement and recommends continue pulmonary toilet -Continue to monitor respiratory status  Aspiration pneumonia in the setting of hiatal hernia versus bilateral pneumonia given bilateral opacities on prior chest x-rays, stable. Blood cultures negative. Sputum culture no growth. MRSA PCR positive on 9/9.  Patient has been on Unasyn since 9/8 and vancomycin since 9/10. -afebrile, no leukocytosis, essentially resolved effusion, still doing well on oral Augmentin to cover for both aspiration pneumonia -Discontinued vancomycin on 9/17 closely monitor respiratory status -Incentive spirometer, flutter valve, continue out of bed to chair, continue supplemental O2 as mentioned above  Acute pulmonary artery thrombus, incidentally found in right pulmonary artery in distal portion of residual stump of RUL branch of right pulmonary artery on CTA  chest, no PE noted -Continue IV heparin for now -Would warrant systemic  anticoagulant for next 3 to 6 months, as recommended by pulmonology -- of note per chart review has not tolerated DOAC in the past ( for postop afib, per Dr. Harl Bowie note from 02/2017), patient and husband states she has been on Coumadin in the past -We will transition to Eliquis per pharmacy protocol, close monitor  COPD, stable. No wheezing.  She was initially started on IV Solu-Medrol 5 mg twice daily on 9/9 x 3 days, then transition to IV Solu-Medrol 20 mg twice daily from 9/13-9/16, followed by IV site Medrol 20 mg daily from 9/17-9/18, now on oral prednisone started on 9/18 -Dulera twice daily,  -- Ioral prednisone 40mg  x2 days to complete 10 day course, end date 9/19  CHF with preserved EF, grade 1 diastolic dysfunction Aortic stenosis, history of moderate ( degree not able to be determined on limited TTE on 9/11) euvolemic on exam -Torsemide 20 mg daily, lopressor --followed by Dr Harl Bowie as outpatient --repeat full TTE now that patient cooperative  Hypokalemia, resolved Mg wnl -Replete as needed -Monitor BMP  Agitation, likely hospital-acquired delirium due to prolonged hospitalization further complicated by metabolic insults (metabolic/infectious encephalopathy including hypercarbia, infection, hyponatremia), resolved. Alert and oriented x4. She does intermittently have confusion typically later night early morning consistent with hospital-acquired delirium. -Precedex drip discontinued on 9/17 this am -Continue Seroquel, Xanax 3 times daily (home medication) -Steroids completed today  CAD (prior CABG 2018) stable --continue aspirin, Imdur   Hiatal hernia  -Repair to be discussed as an outpatient --continue PPI, transitioned to oral  Remote history of adenocarcinoma the lung status post right upper lobe lobectomy (2018) -Continue outpatient follow-up and surveillance   Family Communication  :   none  Code Status :  full  Disposition Plan  :  Patient is from [place]. Anticipated d/c date:  1-2 days. Barriers to d/c or necessity for inpatient status: repeat TTE for evaluation of AS, , need plan for anticoagulation for pulmonary thrombus still on iv heparin currently, monitor transition to Eliquis Consults  :  Ct SURGERY. PCCM, IR  Procedures  :  none  DVT Prophylaxis  :  Heparin drip  Lab Results  Component Value Date   PLT 201 11/04/2019    Diet :  Diet Order            Diet heart healthy/carb modified Room service appropriate? Yes; Fluid consistency: Thin  Diet effective now                  Inpatient Medications Scheduled Meds: . ALPRAZolam  0.25 mg Oral TID  . amoxicillin-clavulanate  1 tablet Oral Q12H  . aspirin EC  81 mg Oral Daily  . Chlorhexidine Gluconate Cloth  6 each Topical Daily  . folic acid  1 mg Intravenous Daily  . guaiFENesin  600 mg Oral BID  . influenza vaccine adjuvanted  0.5 mL Intramuscular Tomorrow-1000  . isosorbide mononitrate  15 mg Oral Daily  . metoprolol tartrate  25 mg Oral BID  . mometasone-formoterol  2 puff Inhalation BID  . pantoprazole  40 mg Oral Daily  . predniSONE  40 mg Oral Q breakfast  . QUEtiapine  25 mg Oral QHS  . sodium chloride flush  10-40 mL Intracatheter Q12H  . thiamine injection  100 mg Intravenous Daily  . torsemide  20 mg Oral Daily   Continuous Infusions: . heparin 1,000 Units/hr (11/04/19 1125)   PRN Meds:.acetaminophen **OR** acetaminophen, albuterol, bisacodyl, bisacodyl, [DISCONTINUED] ondansetron **OR** ondansetron (ZOFRAN) IV, sodium chloride  flush  Antibiotics  :   Anti-infectives (From admission, onward)   Start     Dose/Rate Route Frequency Ordered Stop   11/02/19 2200  amoxicillin-clavulanate (AUGMENTIN) 875-125 MG per tablet 1 tablet        1 tablet Oral Every 12 hours 11/02/19 1650     10/26/19 0600  vancomycin (VANCOCIN) IVPB 1000 mg/200 mL premix  Status:  Discontinued        1,000  mg 200 mL/hr over 60 Minutes Intravenous Every 12 hours 10/25/19 1654 11/02/19 1650   10/25/19 1715  vancomycin (VANCOREADY) IVPB 2000 mg/400 mL        2,000 mg 200 mL/hr over 120 Minutes Intravenous  Once 10/25/19 1652 10/25/19 2000   10/24/19 2030  Ampicillin-Sulbactam (UNASYN) 3 g in sodium chloride 0.9 % 100 mL IVPB  Status:  Discontinued        3 g 200 mL/hr over 30 Minutes Intravenous Every 8 hours 10/24/19 1949 11/02/19 1650   10/24/19 2000  azithromycin (ZITHROMAX) tablet 500 mg  Status:  Discontinued        500 mg Oral Daily 10/24/19 1923 10/28/19 1944       Objective   Vitals:   11/04/19 0609 11/04/19 0759 11/04/19 0845 11/04/19 1123  BP:  (!) 124/48  (!) 145/76  Pulse:  85  76  Resp: 20 18  15   Temp:  98.9 F (37.2 C)  98.7 F (37.1 C)  TempSrc:  Oral  Oral  SpO2:  99% 97% 98%  Weight: 82.6 kg     Height:        SpO2: 98 % O2 Flow Rate (L/min): 1 L/min FiO2 (%): 24 %  Wt Readings from Last 3 Encounters:  11/04/19 82.6 kg  10/18/19 85.8 kg  10/01/19 85.7 kg     Intake/Output Summary (Last 24 hours) at 11/04/2019 1451 Last data filed at 11/04/2019 0000 Gross per 24 hour  Intake 547.63 ml  Output --  Net 547.63 ml    Physical Exam:     Awake Alert, Oriented X 3, Normal affect No new F.N deficits,  [place].AT, Normal respiratory effort on 2 L, rhonchorous RRR,Loud SEM +ve B.Sounds, Abd Soft, No tenderness, No rebound, guarding or rigidity. No Cyanosis, No new Rash or bruise   I have personally reviewed the following:   Data Reviewed:  CBC Recent Labs  Lab 10/31/19 0605 11/01/19 0241 11/02/19 0028 11/03/19 0151 11/04/19 0647  WBC 8.0 8.2 9.1 10.1 8.4  HGB 10.5* 10.6* 10.7* 11.7* 10.5*  HCT 34.2* 34.4* 34.7* 37.9 35.6*  PLT 154 208 255 276 201  MCV 87.9 87.1 87.0 88.6 88.8  MCH 27.0 26.8 26.8 27.3 26.2  MCHC 30.7 30.8 30.8 30.9 29.5*  RDW 15.3 15.2 15.8* 15.9* 16.0*    Chemistries  Recent Labs  Lab 10/30/19 1155 10/30/19 1155  10/30/19 1218 10/31/19 0605 11/01/19 0241 11/02/19 0028 11/02/19 0934 11/02/19 1430 11/03/19 0151 11/03/19 1528  NA 130*   < > 134* 135 136 135  --   --  139  --   K 5.6*   < > 4.8 4.3 4.0 3.0*  --   --  4.1  --   CL 93*  --   --  94* 89* 89*  --   --  97*  --   CO2 28  --   --  32 35* 37*  --   --  33*  --   GLUCOSE 133*  --   --  110*  92 89  --   --  96  --   BUN 21  --   --  16 13 11   --   --  11  --   CREATININE 0.54  --   --  0.60 0.52 0.62  --   --  0.62  --   CALCIUM 7.9*  --   --  8.0* 8.3* 8.4*  --   --  9.2  --   MG  --   --   --  2.1  --   --  1.8 1.7  --   --   AST  --   --   --  22  --   --   --   --   --  15  ALT  --   --   --  33  --   --   --   --   --  24  ALKPHOS  --   --   --  50  --   --   --   --   --  60  BILITOT  --   --   --  1.9*  --   --   --   --   --  1.4*   < > = values in this interval not displayed.   ------------------------------------------------------------------------------------------------------------------ No results for input(s): CHOL, HDL, LDLCALC, TRIG, CHOLHDL, LDLDIRECT in the last 72 hours.  Lab Results  Component Value Date   HGBA1C 6.2 (H) 12/22/2017   ------------------------------------------------------------------------------------------------------------------ No results for input(s): TSH, T4TOTAL, T3FREE, THYROIDAB in the last 72 hours.  Invalid input(s): FREET3 ------------------------------------------------------------------------------------------------------------------ No results for input(s): VITAMINB12, FOLATE, FERRITIN, TIBC, IRON, RETICCTPCT in the last 72 hours.  Coagulation profile No results for input(s): INR, PROTIME in the last 168 hours.  No results for input(s): DDIMER in the last 72 hours.  Cardiac Enzymes No results for input(s): CKMB, TROPONINI, MYOGLOBIN in the last 168 hours.  Invalid input(s):  CK ------------------------------------------------------------------------------------------------------------------    Component Value Date/Time   BNP 905.0 (H) 10/25/2019 2133    Micro Results No results found for this or any previous visit (from the past 240 hour(s)).  Radiology Reports CT HEAD WO CONTRAST  Result Date: 10/25/2019 CLINICAL DATA:  64 year old female with hypoxia and altered mental status. EXAM: CT HEAD WITHOUT CONTRAST TECHNIQUE: Contiguous axial images were obtained from the base of the skull through the vertex without intravenous contrast. COMPARISON:  Head CT 09/28/2017. FINDINGS: Brain: No midline shift, ventriculomegaly, mass effect, evidence of mass lesion, intracranial hemorrhage or evidence of cortically based acute infarction. Scattered, patchy bilateral cerebral white matter hypodensity appears not significantly changed. Otherwise gray-white matter differentiation remains within normal limits. Vascular: Calcified atherosclerosis at the skull base. Skull: No acute osseous abnormality identified. Sinuses/Orbits: Visualized paranasal sinuses and mastoids are stable and well pneumatized. Other: Visualized orbits and scalp soft tissues are within normal limits. IMPRESSION: 1. No acute intracranial abnormality by CT. 2. Patchy chronic white matter changes, most commonly due to chronic small vessel disease. Electronically Signed   By: Genevie Ann M.D.   On: 10/25/2019 16:10   CT Angio Chest PE W and/or Wo Contrast  Result Date: 10/24/2019 CLINICAL DATA:  Chest pain. EXAM: CT ANGIOGRAPHY CHEST WITH CONTRAST TECHNIQUE: Multidetector CT imaging of the chest was performed using the standard protocol during bolus administration of intravenous contrast. Multiplanar CT image reconstructions and MIPs were obtained to evaluate the vascular anatomy. CONTRAST:  187mL OMNIPAQUE IOHEXOL 350 MG/ML SOLN COMPARISON:  February 27, 2019. FINDINGS: Cardiovascular: Atherosclerosis of thoracic aorta is  noted without aneurysm or dissection. Status post coronary bypass graft. The patient is status post right upper lobectomy. There is the interval development of thrombus within the most distal portion of the stump of the right upper lobe branch of the right pulmonary artery. No definite embolus is noted. Mild cardiomegaly is noted. No pericardial effusion is noted. Mediastinum/Nodes: Moderate size sliding-type hiatal hernia is noted. Thyroid gland is unremarkable. No adenopathy is noted. Lungs/Pleura: No pneumothorax is noted. Left lung is clear. Moderate size loculated right pleural effusion is noted with associated subsegmental atelectasis of the right lower lobe. Status post right upper lobectomy as described above. Upper Abdomen: No acute abnormality. Musculoskeletal: No chest wall abnormality. No acute or significant osseous findings. Review of the MIP images confirms the above findings. IMPRESSION: 1. Status post right upper lobectomy. Interval development of thrombus within the most distal portion of the residual stump of the right upper lobe branch of the right pulmonary artery. No definite pulmonary embolus is noted. 2. Moderate size loculated right pleural effusion is noted with associated subsegmental atelectasis of the right lower lobe. 3. Moderate size sliding-type hiatal hernia. 4. Aortic atherosclerosis. Aortic Atherosclerosis (ICD10-I70.0). Electronically Signed   By: Marijo Conception M.D.   On: 10/24/2019 15:47   [place] CHEST (PLEURAL EFFUSION)  Result Date: 10/25/2019 CLINICAL DATA:  RIGHT pleural effusion by CT, history of COPD, GERD, RIGHT upper lobe lung cancer post upper lobectomy EXAM: CHEST ULTRASOUND COMPARISON:  CT angio chest 10/24/2019 FINDINGS: Small pleural effusion identified at the RIGHT lung base. Effusion is partially loculated. Atelectatic RIGHT lower lobe is adherent to the posterior chest wall and prevents safe access of the inferior thoracic cavity for thoracentesis. This  appearance was seen on the prior CT but appears fixed with change in position on ultrasound. IMPRESSION: Small loculated RIGHT pleural effusion, unable to safely access for thoracentesis. Electronically Signed   By: Lavonia Dana M.D.   On: 10/25/2019 11:00   CT CHEST LIMITED WO CONTRAST  Result Date: 11/02/2019 INDICATION: Loculated right-sided pleural effusion. Please perform image guided chest tube placement for infection source control purposes. Note, there is attempt earlier this week to perform image guided chest tube placement however the patient could not tolerate the examination secondary to altered mental status. EXAM: LIMITED CHEST CT COMPARISON:  Chest CT-10/24/2019 MEDICATIONS: None ANESTHESIA/SEDATION: None CONTRAST:  None COMPLICATIONS: None immediate. PROCEDURE: Informed written consent was obtained from the patient after a discussion of the risks, benefits and alternatives to treatment. The patient was placed supine, slightly LPO on the CT gantry and a pre procedural CT was performed demonstrating significant reduction, near complete resolution of residual trace right-sided pleural effusion with minimal amount of fluid seen at the level of the right lung apex and posteromedial aspect of the right lung base, too small to warrant CT-guided chest tube placement. Above was discussed with referring cardiothoracic surgeon, Dr. Kipp Brood, the decision was made not to proceed with intervention at this time. IMPRESSION: Significant reduction/near resolution of residual trace right-sided effusion, too small to warrant CT-guided chest tube placement. No procedure performed. Electronically Signed   By: Sandi Mariscal M.D.   On: 11/02/2019 10:43   DG CHEST PORT 1 VIEW  Result Date: 11/02/2019 CLINICAL DATA:  Pleural effusion EXAM: PORTABLE CHEST 1 VIEW COMPARISON:  10/31/2019 FINDINGS: Prior median sternotomy and CABG. Heart is normal size. Bibasilar airspace opacities, right greater than left with some  improvement on the  right since prior study. No effusions or pneumothorax. IMPRESSION: Bilateral lower lobe airspace opacities, right greater than left with some improvement in aeration at the right base since prior study. Electronically Signed   By: Rolm Baptise M.D.   On: 11/02/2019 11:22   DG CHEST PORT 1 VIEW  Result Date: 10/31/2019 CLINICAL DATA:  Shortness of breath EXAM: PORTABLE CHEST 1 VIEW COMPARISON:  October 29, 2019 chest radiograph and chest CT October 24, 2019 FINDINGS: There is a persistent fairly small right pleural effusion. There is ill-defined airspace opacity in the right base with apparent postoperative change in this area. Elsewhere, there is mild interstitial edema. There is cardiomegaly with pulmonary venous hypertension. Patient is status post internal mammary bypass grafting. No adenopathy. There is calcification in the aortic arch as well as in the left carotid artery. IMPRESSION: Cardiomegaly with pulmonary vascular congestion. Interstitial pulmonary edema with right pleural effusion. These are findings felt to be indicative of a degree of underlying congestive heart failure. There is superimposed airspace opacity in the right base which may represent alveolar edema or a degree of superimposed pneumonia. Postoperative changes noted. There is left carotid artery calcification. Aortic Atherosclerosis (ICD10-I70.0). Electronically Signed   By: Lowella Grip III M.D.   On: 10/31/2019 08:11   DG CHEST PORT 1 VIEW  Result Date: 10/29/2019 CLINICAL DATA:  Shortness of breath, respiratory failure, pleural effusion EXAM: PORTABLE CHEST 1 VIEW COMPARISON:  CTA chest dated 10/24/2019 FINDINGS: Small to moderate right pleural effusion, possibly mildly improved. Associated right lower lobe opacity, likely atelectasis. Postsurgical changes in the right upper hemithorax. Left lung is clear. No frank interstitial edema. Cardiomegaly. Postsurgical changes related to prior CABG. Median  sternotomy. IMPRESSION: Small to moderate right pleural effusion, possibly mildly improved. Postsurgical changes in the right upper hemithorax. Electronically Signed   By: Julian Hy M.D.   On: 10/29/2019 08:16   DG Chest Portable 1 View  Result Date: 10/24/2019 CLINICAL DATA:  Shortness of breath with fever and chills EXAM: PORTABLE CHEST 1 VIEW COMPARISON:  May 25, 2019 FINDINGS: There is a small right pleural effusion. There is postoperative change with scarring on the right. There is no edema or airspace opacity. Heart is upper normal in size with pulmonary vascularity normal. Patient is status post internal mammary bypass grafting. No adenopathy. No bone lesions. IMPRESSION: Small right pleural effusion. Scarring with volume loss on the right. No frank edema or airspace opacity. Stable cardiac silhouette. Postoperative changes noted. Electronically Signed   By: Lowella Grip III M.D.   On: 10/24/2019 13:32   CT IMAGE GUIDED DRAINAGE BY PERCUTANEOUS CATHETER  Result Date: 11/02/2019 INDICATION: Loculated right-sided pleural effusion. Please perform image guided chest tube placement for infection source control purposes. Note, there is attempt earlier this week to perform image guided chest tube placement however the patient could not tolerate the examination secondary to altered mental status. EXAM: LIMITED CHEST CT COMPARISON:  Chest CT-10/24/2019 MEDICATIONS: None ANESTHESIA/SEDATION: None CONTRAST:  None COMPLICATIONS: None immediate. PROCEDURE: Informed written consent was obtained from the patient after a discussion of the risks, benefits and alternatives to treatment. The patient was placed supine, slightly LPO on the CT gantry and a pre procedural CT was performed demonstrating significant reduction, near complete resolution of residual trace right-sided pleural effusion with minimal amount of fluid seen at the level of the right lung apex and posteromedial aspect of the right lung  base, too small to warrant CT-guided chest tube placement. Above was discussed with  referring cardiothoracic surgeon, Dr. Kipp Brood, the decision was made not to proceed with intervention at this time. IMPRESSION: Significant reduction/near resolution of residual trace right-sided effusion, too small to warrant CT-guided chest tube placement. No procedure performed. Electronically Signed   By: Sandi Mariscal M.D.   On: 11/02/2019 10:43   VAS [place] LOWER EXTREMITY VENOUS (DVT)  Result Date: 10/29/2019  Lower Venous DVTStudy Indications: Pulmonary embolism, and See Previous Cat Scan.  Risk Factors: Suspected PE. Limitations: Limited due to patient movement. Performing Technologist: Griffin Basil RCT RDMS  Examination Guidelines: A complete evaluation includes B-mode imaging, spectral Doppler, color Doppler, and power Doppler as needed of all accessible portions of each vessel. Bilateral testing is considered an integral part of a complete examination. Limited examinations for reoccurring indications may be performed as noted. The reflux portion of the exam is performed with the patient in reverse Trendelenburg.  +---------+---------------+---------+-----------+----------+--------------+ RIGHT    CompressibilityPhasicitySpontaneityPropertiesThrombus Aging +---------+---------------+---------+-----------+----------+--------------+ CFV      Full           Yes      Yes                                 +---------+---------------+---------+-----------+----------+--------------+ SFJ      Full                                                        +---------+---------------+---------+-----------+----------+--------------+ FV Prox  Full                                                        +---------+---------------+---------+-----------+----------+--------------+ FV Mid   Full                                                         +---------+---------------+---------+-----------+----------+--------------+ FV DistalFull                                                        +---------+---------------+---------+-----------+----------+--------------+ PFV      Full                                                        +---------+---------------+---------+-----------+----------+--------------+ POP      Full                    Yes                                 +---------+---------------+---------+-----------+----------+--------------+ PTV      Full                                                        +---------+---------------+---------+-----------+----------+--------------+  PERO     Full                                                        +---------+---------------+---------+-----------+----------+--------------+   +---------+---------------+---------+-----------+----------+------------------+ LEFT     CompressibilityPhasicitySpontaneityPropertiesThrombus Aging     +---------+---------------+---------+-----------+----------+------------------+ CFV      Full           Yes      Yes                                     +---------+---------------+---------+-----------+----------+------------------+ SFJ      Full                                                            +---------+---------------+---------+-----------+----------+------------------+ FV Prox  Full                                                            +---------+---------------+---------+-----------+----------+------------------+ FV Mid   Full                                                            +---------+---------------+---------+-----------+----------+------------------+ FV DistalFull                                                            +---------+---------------+---------+-----------+----------+------------------+ PFV      Full                                                             +---------+---------------+---------+-----------+----------+------------------+ POP      Full                                                            +---------+---------------+---------+-----------+----------+------------------+ PTV      Full                                                            +---------+---------------+---------+-----------+----------+------------------+ PERO  Full                                         Limited                                                                  visualization      +---------+---------------+---------+-----------+----------+------------------+     Summary: RIGHT: - There is no evidence of deep vein thrombosis in the lower extremity.  - No cystic structure found in the popliteal fossa.  LEFT: - There is no evidence of deep vein thrombosis in the lower extremity.  - No cystic structure found in the popliteal fossa.  *See table(s) above for measurements and observations. Electronically signed by Servando Snare MD on 10/29/2019 at 1:01:37 PM.    Final    ECHOCARDIOGRAM LIMITED  Result Date: 10/27/2019    ECHOCARDIOGRAM LIMITED REPORT   Patient Name:   GEORGIE EDUARDO Date of Exam: 10/27/2019 Medical Rec #:  [id number]    Height:       65.0 in Accession #:    [id number]   Weight:       196.6 lb Date of Birth:  [birth year]    BSA:          1.964 m Patient Age:    77 years     BP:           148/66 mmHg Patient Gender: F            HR:           84 bpm. Exam Location:  Inpatient Procedure: Limited Echo, Limited Color Doppler and Cardiac Doppler Indications:     Dyspnea 786.09 / R06.00  History:         Patient has prior history of Echocardiogram examinations, most                  recent 08/10/2019. CAD; Prior CABG. Previous Myocardial                  Infarction, Prior                  CABG, COPD; Signs/Symptoms:Shortness of Breath. 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                   Parapneumonic right-sided effusion.  Sonographer:     Darlina Sicilian RDCS Referring Phys:  [id number] COURAGE EMOKPAE Diagnosing Phys: Fransico Him MD  Sonographer Comments: Image acquisition challenging due to patient behavioral factors. IMPRESSIONS  1. Left ventricular endocardial border not optimally defined to evaluate regional wall motion. Left ventricular diastolic function could not be evaluated.Patient was not cooperative with exam.  2. Right ventricular systolic function was not well visualized. The right ventricular size is not well visualized.  3. The mitral valve is normal in structure. Mild mitral valve regurgitation. No evidence of mitral stenosis. Moderate mitral annular calcification.  4. The aortic valve is calcified. There is severe calcifcation of the aortic valve. There is moderate thickening of the aortic valve.  Aortic valve regurgitation is not visualized. There is likely severe AS but study not sufficient for adequate assessment.  5. Recommend repeat 2D echo full study once patient able to cooperate. FINDINGS  Left Ventricle: Left ventricular endocardial border not optimally defined to evaluate regional wall motion. The left ventricular internal cavity size was normal in size. There is no left ventricular hypertrophy. Left ventricular diastolic function could  not be evaluated. Right Ventricle: The right ventricular size is not well visualized. Right vetricular wall thickness was not assessed. Right ventricular systolic function was not well visualized. Left Atrium: Left atrial size was not well visualized. Right Atrium: Right atrial size was not well visualized. Pericardium: There is no evidence of pericardial effusion. Mitral Valve: The mitral valve is normal in structure. Moderate mitral annular calcification. Mild mitral valve regurgitation. No evidence of mitral valve stenosis. Tricuspid Valve: The tricuspid valve is normal in structure. Tricuspid valve regurgitation is mild . No evidence  of tricuspid stenosis. Aortic Valve: The aortic valve is calcified. There is severe calcifcation of the aortic valve. There is moderate thickening of the aortic valve. Aortic valve regurgitation is not visualized. There is likely severe AS but study not sufficient for adequate  assessment. Pulmonic Valve: The pulmonic valve was not assessed. Aorta: The aortic root is normal in size and structure. IAS/Shunts: No atrial level shunt detected by color flow Doppler. LEFT VENTRICLE PLAX 2D LVOT diam:     1.90 cm LVOT Area:     2.84 cm   AORTA Ao Root diam: 2.80 cm  SHUNTS Systemic Diam: 1.90 cm Fransico Him MD Electronically signed by Fransico Him MD Signature Date/Time: 10/27/2019/2:08:25 PM    Final (Updated)    [place] EKG SITE RITE  Result Date: 10/28/2019 If Site Rite image not attached, placement could not be confirmed due to current cardiac rhythm.    Time Spent in minutes  > 30     Desiree Hane M.D on 11/04/2019 at 2:51 PM  To page go to [email or web address] - password [facility]

## 2019-11-04 NOTE — Progress Notes (Signed)
  Echocardiogram 2D Echocardiogram has been performed.  Emily Rocha 11/04/2019, 3:00 PM

## 2019-11-04 NOTE — Progress Notes (Signed)
ANTICOAGULATION CONSULT NOTE - Initial Consult  Pharmacy Consult for Eliquis Indication: Thrombus on CT  No Known Allergies  Patient Measurements: Height: 5\' 5"  (165.1 cm) Weight: 82.6 kg (182 lb 3.2 oz) IBW/kg (Calculated) : 57  Vital Signs: Temp: 98.7 F (37.1 C) (09/19 1123) Temp Source: Oral (09/19 1123) BP: 145/76 (09/19 1123) Pulse Rate: 76 (09/19 1123)  Labs: Recent Labs    11/02/19 0028 11/02/19 0934 11/03/19 0151 11/03/19 0151 11/03/19 0931 11/03/19 1227 11/04/19 0647  HGB 10.7*  --  11.7*  --   --   --  10.5*  HCT 34.7*  --  37.9  --   --   --  35.6*  PLT 255  --  276  --   --   --  201  HEPARINUNFRC 0.72*   < > 0.72*   < > 0.12* 0.34 1.01*  CREATININE 0.62  --  0.62  --   --   --   --    < > = values in this interval not displayed.    Estimated Creatinine Clearance: 73.4 mL/min (by C-G formula based on SCr of 0.62 mg/dL).   Medical History: Past Medical History:  Diagnosis Date  . Anxiety   . Asthma   . COPD (chronic obstructive pulmonary disease) ([place])   . Coronary artery disease    hx CABG  . GERD (gastroesophageal reflux disease)   . Peripheral vascular disease ([name])   . Pneumonia   . Shortness of breath dyspnea   . Stroke [facility])    "mini stroke" numbness and weakness to left side of the body    Medications:  Scheduled:  . ALPRAZolam  0.25 mg Oral TID  . amoxicillin-clavulanate  1 tablet Oral Q12H  . apixaban  10 mg Oral BID   Followed by  . [START ON 11/11/2019] apixaban  5 mg Oral BID  . aspirin EC  81 mg Oral Daily  . Chlorhexidine Gluconate Cloth  6 each Topical Daily  . folic acid  1 mg Intravenous Daily  . guaiFENesin  600 mg Oral BID  . influenza vaccine adjuvanted  0.5 mL Intramuscular Tomorrow-1000  . isosorbide mononitrate  15 mg Oral Daily  . metoprolol tartrate  25 mg Oral BID  . mometasone-formoterol  2 puff Inhalation BID  . pantoprazole  40 mg Oral Daily  . QUEtiapine  25 mg Oral QHS  . sodium chloride flush  10-40 mL  Intracatheter Q12H  . thiamine injection  100 mg Intravenous Daily  . torsemide  20 mg Oral Daily    Assessment: CBC stable and no new bleeding noted.     Plan:  Discontinue heparin gtt Initiate apixaban 10mg  BID x 7 days, followed by 5mg  BID Monitor CBCs and signs of bleeding Pharmacy will educate patient  Norina Buzzard, PharmD PGY1 Pharmacy Resident 11/04/2019 3:18 PM

## 2019-11-04 NOTE — TOC Progression Note (Signed)
Transition of Care [facility]) - Progression Note    Patient Details  Name: HATSUE SIME MRN: [id number] Date of Birth: 01-Nov-1955  Transition of Care [facility]) CM/SW Contact  Claudie Leach, RN 11/04/2019, 11:36 AM  Clinical Narrative:    Patient to d/c home with HHPT.  Referral accepted by [facility] with Alvis Lemmings for [place].    DME O2 ordered- will be arranged when d/c plan is known.    Barriers to Discharge: Continued Medical Work up  Expected Discharge Plan and Services      HH Arranged: PT [place]: [place] Date [place]: 11/04/19 Time [place]: [id number] Representative spoke with at [place]: Tommi Rumps

## 2019-11-04 NOTE — Progress Notes (Signed)
[place] for heparin IV Indication: Rule out ACS/ thrombus seen on CT  No Known Allergies  Patient Measurements: Height: 5\' 5"  (165.1 cm) Weight: 82.6 kg (182 lb 3.2 oz) IBW/kg (Calculated) : 57 Heparin Dosing Weight: 73 kg  Vital Signs: Temp: 99.1 F (37.3 C) (09/19 0608) Temp Source: Oral (09/19 0608) BP: 119/52 (09/19 0608) Pulse Rate: 86 (09/19 0608)  Labs: Recent Labs    11/02/19 0028 11/02/19 0934 11/03/19 0151 11/03/19 0151 11/03/19 0931 11/03/19 1227 11/04/19 0647  HGB 10.7*  --  11.7*  --   --   --  10.5*  HCT 34.7*  --  37.9  --   --   --  35.6*  PLT 255  --  276  --   --   --  201  HEPARINUNFRC 0.72*   < > 0.72*   < > 0.12* 0.34 1.01*  CREATININE 0.62  --  0.62  --   --   --   --    < > = values in this interval not displayed.    Estimated Creatinine Clearance: 73.4 mL/min (by C-G formula based on SCr of 0.62 mg/dL).   Assessment: IV heparin for elevated troponins and thrombus on CT (not PE). Hgb 12.1. Ddimer 5.27 on admit.  - 9/8 CT: s/p RU lobectomy. Interval development of thrombus within the most distal portion of the residual stump of the right upper lobe branch of the right pulmonary artery. No definite pulmonary embolus is noted.  Heparin level supratherapeutic 1.01 on gtt at 1150 units/hr. CBC stable. I spoke to patient and nursing. No issues with line or bleeding reported per RN. Level drawn appropriately from opposite arm to heparin infusion.   Patient has been on similar dose of heparin for several days, but has had both low and high levels. Will decrease dose as appropriate and recheck.    Goal of Therapy:  Heparin level 0.3-0.7 units/ml Monitor platelets by anticoagulation protocol: Yes   Plan:  Decrease heparin IV to 1000 units/h Repeat 6hr heparin level Daily heparin level and CBC  Norina Buzzard, PharmD PGY1 Pharmacy Resident 11/04/2019 7:42 AM

## 2019-11-05 DIAGNOSIS — M25512 Pain in left shoulder: Secondary | ICD-10-CM

## 2019-11-05 DIAGNOSIS — M25519 Pain in unspecified shoulder: Secondary | ICD-10-CM

## 2019-11-05 LAB — CREATININE, SERUM
Creatinine, Ser: 0.66 mg/dL (ref 0.44–1.00)
GFR calc Af Amer: >60 mL/min (ref >60)
GFR calc non Af Amer: >60 mL/min (ref >60)

## 2019-11-05 LAB — CBC
HCT: 35 % — ABNORMAL LOW (ref 36.0–46.0)
Hemoglobin: 10.2 g/dL — ABNORMAL LOW (ref 12.0–15.0)
MCH: 26.5 pg (ref 26.0–34.0)
MCHC: 29.1 g/dL — ABNORMAL LOW (ref 30.0–36.0)
MCV: 90.9 fL (ref 80.0–100.0)
Platelets: 188 10*3/uL (ref 150–400)
RBC: 3.85 MIL/uL — ABNORMAL LOW (ref 3.87–5.11)
RDW: 16.1 % — ABNORMAL HIGH (ref 11.5–15.5)
WBC: 7.3 10*3/uL (ref 4.0–10.5)
nRBC: 0 % (ref 0.0–0.2)

## 2019-11-05 LAB — GLUCOSE, CAPILLARY
Glucose-Capillary: 80 mg/dL (ref 70–99)
Glucose-Capillary: 71 mg/dL (ref 70–99)

## 2019-11-05 MED ORDER — APIXABAN 5 MG PO TABS
10.0000 mg | ORAL_TABLET | Freq: Two times a day (BID) | ORAL | Status: DC
  Administered 2019-11-05 – 2019-11-06 (×2): 10 mg via ORAL
  Filled 2019-11-05 (×2): qty 2

## 2019-11-05 MED ORDER — APIXABAN 5 MG PO TABS: 5.0000 mg | ORAL_TABLET | Freq: Two times a day (BID) | ORAL | Status: DC

## 2019-11-05 NOTE — Progress Notes (Signed)
Physical Therapy Treatment Patient Details Name: Emily Rocha MRN: [id number] DOB: 08-12-1955 Today's Date: 11/05/2019    History of Present Illness Pt is 64 yo admitted to [facility] on 9/8 with SOB found to have loculated pleural effusion with progressive confusion and transferred to [facility] 9/11. Pt with metabolic encephalopathy and unable to tolerate IR place chest tube 9/13. Plan was for chest tube placement 11/02/19 - but pleural effusion to small and procedure not performed. PMhx: Right upper lobectomy due to lung CA 10/2018, severe AS, hiatal hernia, COPD, CAD    PT Comments    Pt admitted with above diagnosis. Pt was able to ambulate without device with overall good stability 450 feet.  Pt was pushing O2 as she desats on RA and needed 3LO2.  Pt was instructed that she would benefit from use of RW however currently pt does not want one.   Pt currently with functional limitations due to balance and endurance deficits. Pt will benefit from skilled PT to increase their independence and safety with mobility to allow discharge to the venue listed below.    SATURATION QUALIFICATIONS: (This note is used to comply with regulatory documentation for home oxygen)  Patient Saturations on Room Air at Rest = 92%  Patient Saturations on Room Air while Ambulating = 85%  Patient Saturations on 3 Liters of oxygen while Ambulating = 92%  Please briefly explain why patient needs home oxygen:Pt requires up to 3L O2 with activity to maintain sats >92%.     Follow Up Recommendations  Home health PT;Supervision/Assistance - 24 hour     Equipment Recommendations  Rolling walker with 5" wheels    Recommendations for Other Services       Precautions / Restrictions Precautions Precautions: Fall Precaution Comments: watch sats Restrictions Weight Bearing Restrictions: No    Mobility  Bed Mobility Overal bed mobility: Needs Assistance Bed Mobility: Supine to Sit     Supine to sit: Min guard Sit to  supine: Min guard   General bed mobility comments: No assist needed  Transfers Overall transfer level: Needs assistance Equipment used: None Transfers: Sit to/from Stand Sit to Stand: Min guard         General transfer comment: Sit to stand  with min guard; cued for hand placement  Ambulation/Gait Ambulation/Gait assistance: Min guard Gait Distance (Feet): 450 Feet Assistive device: None Gait Pattern/deviations: Step-through pattern;Decreased stride length Gait velocity: decreased Gait velocity interpretation: <1.31 ft/sec, indicative of household ambulator General Gait Details: min cues for balance and posture, no LOB with min challenges.  Pt wanted to push O2 thereefore allowed her to do so. Pt sats on RA at rest was 92%.  Desat to 85% on RA with actiivty and needed 3LO2 to keep sats >93%.    Stairs             Wheelchair Mobility    Modified Rankin (Stroke Patients Only)       Balance Overall balance assessment: Needs assistance Sitting-balance support: Feet supported;No upper extremity supported Sitting balance-Leahy Scale: Good Sitting balance - Comments: with reaching towards feet to don sock   Standing balance support: During functional activity;No upper extremity supported Standing balance-Leahy Scale: Good                              Cognition Arousal/Alertness: Awake/alert Behavior During Therapy: Impulsive Overall Cognitive Status: Within Functional Limits for tasks assessed Area of Impairment: Safety/judgement  Orientation Level: Time;Situation;Place;Disoriented to Current Attention Level: Focused Memory: Decreased short-term memory   Safety/Judgement: Decreased awareness of deficits;Decreased awareness of safety            Exercises      General Comments General comments (skin integrity, edema, etc.): Pt needed 3LO2 with ambulation to maintain sats. Other VSS      Pertinent Vitals/Pain Pain  Assessment: No/denies pain    Home Living                      Prior Function            PT Goals (current goals can now be found in the care plan section) Acute Rehab PT Goals Patient Stated Goal: to breathe better and go home Progress towards PT goals: Progressing toward goals    Frequency    Min 3X/week      PT Plan Current plan remains appropriate    Co-evaluation              AM-PAC PT "6 Clicks" Mobility   Outcome Measure  Help needed turning from your back to your side while in a flat bed without using bedrails?: None Help needed moving from lying on your back to sitting on the side of a flat bed without using bedrails?: None Help needed moving to and from a bed to a chair (including a wheelchair)?: None Help needed standing up from a chair using your arms (e.g., wheelchair or bedside chair)?: A Little Help needed to walk in hospital room?: A Little Help needed climbing 3-5 steps with a railing? : A Little 6 Click Score: 21    End of Session Equipment Utilized During Treatment: Oxygen;Gait belt Activity Tolerance: Patient tolerated treatment well Patient left: with call bell/phone within reach;in bed Nurse Communication: Mobility status PT Visit Diagnosis: Unsteadiness on feet (R26.81);Other abnormalities of gait and mobility (R26.89);Muscle weakness (generalized) (M62.81)     Time: 1497-0263 PT Time Calculation (min) (ACUTE ONLY): 14 min  Charges:  $Gait Training: 8-22 mins                     Cannan Beeck W,PT [place] Pager:  [phone]  Office:  [place] 11/05/2019, 10:34 AM

## 2019-11-05 NOTE — Progress Notes (Signed)
[facility]  PROGRESS NOTE  Emily Rocha HFW:[id number] DOB: 11-11-1955 DOA: 10/24/2019 PCP: Glenda Chroman, MD Admit date - 10/24/2019   Admitting Physician Bonnielee Haff, MD  Outpatient Primary MD for the patient is Glenda Chroman, MD  LOS - 12 Brief Narrative   Emily Rocha is a 64 y.o. year old female with medical history significant for COPD, coronary artery disease, adenocarcinoma of the right lung requiring VATS and right upper lobe lobectomy (2018), also newly identified hiatal hernia admitted initially with pneumonia and what appeared to be complicated right pleural effusion at [facility] subsequently transferred to [facility] on 9/11 due to acute hypoxic and hypercapnic respiratory failure related to loculated right-sided pleural effusion.  Patient was started on Unasyn, azithromycin, vancomycin due to concern for pneumonia. She underwent unsuccessful attempt at ultrasound-guided thoracentesis on 10/25/2019 due to pain.  Patient was transferred to [facility] for CT surgery evaluation in order to rule out infection/possible empyema on 9/11.   Hospital course was complicated by metabolic encephalopathy related to hypercarbia, hyponatremia, infection and hospital-acquired delirium which ultimately required Precedex and monitoring in ICU by critical care team, COPD exacerbation requiring IV steroids and scheduled inhalers and BiPAP, and incidental finding of right pulmonary artery surgical stump thrombus for which she was started on IV heparin.  She was unable to have placement of chest tube by IR on 9/13 for loculated right effusion given due to agitation related to encephalopathy    Subjective  Complains of reproducible left shoulder pain, worse when raising left arm, feels better when she closes her blanket over her, states she has noticed it with her severe coughing.  Denies any chest pain, no shortness of breath.  States her cough is still very productive. A &  P  Acute hypoxic/hypercarbic respiratory failure, multifactorial etiology including  pneumonia complicated by loculated right-sided pleural effusion, COPD exacerbation, improving  Stable on 2 L O2 saturations > 95%.  Underwent ambulatory O2 testing and showed oxygen desaturation without supplemental O2 -Continue Dulera, oral prednisone burst for COPD portion -Augmentin for presumed aspiration pneumonia in setting of hiatal hernia (Unasyn, vancomycin day 7--given positive MRSA PCR screen--now discontinued) - pulmonary toilet with incentive spirometry and flutter valve.   Loculated right-sided pleural effusion, improved.  Presumed to be possible empyema however patient was never able to have any chest tube placed due to agitation/metabolic encephalopathy. Repeat CT chest on 9/17 shows significant reduction with only residual trace pleural effusion too small for chest tube placement -CT surgery agrees no need for chest tube placement and recommends continue pulmonary toilet -Continue to monitor respiratory status  Aspiration pneumonia in the setting of hiatal hernia versus bilateral pneumonia given bilateral opacities on prior chest x-rays, stable. Blood cultures negative. Sputum culture no growth. MRSA PCR positive on 9/9.  Patient has been on Unasyn since 9/8 and vancomycin since 9/10. -afebrile, no leukocytosis, essentially resolved effusion, still doing well on oral Augmentin to cover for both aspiration pneumonia, continue for 14-day course of antibiotics -Discontinued vancomycin on 9/17 closely monitor respiratory status -Incentive spirometer, flutter valve, continue out of bed to chair, continue supplemental O2 as mentioned above  Acute pulmonary artery thrombus, incidentally found in right pulmonary artery in distal portion of residual stump of RUL branch of right pulmonary artery on CTA chest, no PE noted -Transition to Eliquis on 9/19, continue to monitor hemoglobin and ability to tolerate  given report of inability to tolerate DOAC in the past per chart  review - systemic anticoagulant for next 3 to 6 months, as recommended by pulmonology  COPD, stable. No wheezing.  She was initially started on IV Solu-Medrol 5 mg twice daily on 9/9 x 3 days, then transition to IV Solu-Medrol 20 mg twice daily from 9/13-9/16, followed by IV site Medrol 20 mg daily from 9/17-9/18, now on oral prednisone started on 9/18 -Dulera twice daily,  --Completed oral prednisone burst  CHF with preserved EF, grade 1 diastolic dysfunction Aortic stenosis, history of moderate ( degree not able to be determined on limited TTE on 9/11) euvolemic on exam TTE shows preserved EF with moderate to severe aortic valve stenosis with mean gradient of 37 mmHg -Torsemide 20 mg daily, lopressor --followed by Dr Harl Bowie as outpatient  Left shoulder pain, MSK in etiology.  Reproducible on exam when lifting left arm, noted mostly when coughing.  Improved with Tylenol -Continue Tylenol as needed -We will add warm compresses-closely monitor   Hypokalemia, resolved Mg wnl -Replete as needed -Monitor BMP  Agitation, likely hospital-acquired delirium due to prolonged hospitalization further complicated by metabolic insults (metabolic/infectious encephalopathy including hypercarbia, infection, hyponatremia), resolved. Alert and oriented x4. She does intermittently have confusion typically later night early morning consistent with hospital-acquired delirium. -Precedex drip discontinued on 9/17 this am -Continue Seroquel, Xanax 3 times daily (home medication) -Steroids completed today  CAD (prior CABG 2018) stable --continue aspirin, Imdur   Hiatal hernia  -Repair to be discussed as an outpatient --continue PPI, transitioned to oral  Remote history of adenocarcinoma the lung status post right upper lobe lobectomy (2018) -Continue outpatient follow-up and surveillance   Family Communication  : Updated husband at  bedside on 9/19  Code Status :  full  Disposition Plan  :  Patient is from [place]. Anticipated d/c date:  1-2 days. Barriers to d/c or necessity for inpatient status: r closely monitor ability to tolerate Eliquis for pulmonary thrombus, if remains stable anticipate discharge next 24 hours consults  :  Ct SURGERY. PCCM, IR  Procedures  :  none  DVT Prophylaxis  :  Heparin drip  Lab Results  Component Value Date   PLT 188 11/05/2019    Diet :  Diet Order            Diet heart healthy/carb modified Room service appropriate? Yes; Fluid consistency: Thin  Diet effective now                  Inpatient Medications Scheduled Meds: . ALPRAZolam  0.25 mg Oral TID  . amoxicillin-clavulanate  1 tablet Oral Q12H  . apixaban  10 mg Oral BID   Followed by  . [START ON 11/12/2019] apixaban  5 mg Oral BID  . aspirin EC  81 mg Oral Daily  . Chlorhexidine Gluconate Cloth  6 each Topical Daily  . folic acid  1 mg Intravenous Daily  . guaiFENesin  600 mg Oral BID  . influenza vaccine adjuvanted  0.5 mL Intramuscular Tomorrow-1000  . isosorbide mononitrate  15 mg Oral Daily  . metoprolol tartrate  25 mg Oral BID  . mometasone-formoterol  2 puff Inhalation BID  . pantoprazole  40 mg Oral Daily  . QUEtiapine  25 mg Oral QHS  . sodium chloride flush  10-40 mL Intracatheter Q12H  . thiamine injection  100 mg Intravenous Daily  . torsemide  20 mg Oral Daily   Continuous Infusions:  PRN Meds:.acetaminophen **OR** acetaminophen, albuterol, bisacodyl, bisacodyl, [DISCONTINUED] ondansetron **OR** ondansetron (ZOFRAN) IV, sodium chloride flush  Antibiotics  :   Anti-infectives (From admission, onward)   Start     Dose/Rate Route Frequency Ordered Stop   11/02/19 2200  amoxicillin-clavulanate (AUGMENTIN) 875-125 MG per tablet 1 tablet        1 tablet Oral Every 12 hours 11/02/19 1650     10/26/19 0600  vancomycin (VANCOCIN) IVPB 1000 mg/200 mL premix  Status:  Discontinued        1,000 mg 200  mL/hr over 60 Minutes Intravenous Every 12 hours 10/25/19 1654 11/02/19 1650   10/25/19 1715  vancomycin (VANCOREADY) IVPB 2000 mg/400 mL        2,000 mg 200 mL/hr over 120 Minutes Intravenous  Once 10/25/19 1652 10/25/19 2000   10/24/19 2030  Ampicillin-Sulbactam (UNASYN) 3 g in sodium chloride 0.9 % 100 mL IVPB  Status:  Discontinued        3 g 200 mL/hr over 30 Minutes Intravenous Every 8 hours 10/24/19 1949 11/02/19 1650   10/24/19 2000  azithromycin (ZITHROMAX) tablet 500 mg  Status:  Discontinued        500 mg Oral Daily 10/24/19 1923 10/28/19 1944       Objective   Vitals:   11/05/19 0157 11/05/19 0448 11/05/19 0732 11/05/19 0806  BP: (!) 104/59 (!) 122/44 (!) 94/58 94/75  Pulse: 80 82 100   Resp: 15 20 20    Temp: 98.6 F (37 C) 98.4 F (36.9 C) 97.9 F (36.6 C)   TempSrc: Oral Oral Oral   SpO2: 97% 96% 97%   Weight:  80.2 kg    Height:        SpO2: 97 % O2 Flow Rate (L/min): 1 L/min FiO2 (%): 24 %  Wt Readings from Last 3 Encounters:  11/05/19 80.2 kg  10/18/19 85.8 kg  10/01/19 85.7 kg     Intake/Output Summary (Last 24 hours) at 11/05/2019 0952 Last data filed at 11/05/2019 0733 Gross per 24 hour  Intake 540 ml  Output 700 ml  Net -160 ml    Physical Exam:     Awake Alert, Oriented X 3, Normal affect No new F.N deficits,  [place].AT, Normal respiratory effort on 2 L, rhonchorous RRR,Loud SEM +ve B.Sounds, Abd Soft, No tenderness, No rebound, guarding or rigidity. No Cyanosis, No new Rash or bruise   I have personally reviewed the following:   Data Reviewed:  CBC Recent Labs  Lab 11/01/19 0241 11/02/19 0028 11/03/19 0151 11/04/19 0647 11/05/19 0456  WBC 8.2 9.1 10.1 8.4 7.3  HGB 10.6* 10.7* 11.7* 10.5* 10.2*  HCT 34.4* 34.7* 37.9 35.6* 35.0*  PLT 208 255 276 201 188  MCV 87.1 87.0 88.6 88.8 90.9  MCH 26.8 26.8 27.3 26.2 26.5  MCHC 30.8 30.8 30.9 29.5* 29.1*  RDW 15.2 15.8* 15.9* 16.0* 16.1*    Chemistries  Recent Labs  Lab  10/30/19 1155 10/30/19 1155 10/30/19 1218 10/31/19 0605 11/01/19 0241 11/02/19 0028 11/02/19 0934 11/02/19 1430 11/03/19 0151 11/03/19 1528 11/05/19 0456  NA 130*   < > 134* 135 136 135  --   --  139  --   --   K 5.6*   < > 4.8 4.3 4.0 3.0*  --   --  4.1  --   --   CL 93*  --   --  94* 89* 89*  --   --  97*  --   --   CO2 28  --   --  32 35* 37*  --   --  33*  --   --   GLUCOSE 133*  --   --  110* 92 89  --   --  96  --   --   BUN 21  --   --  16 13 11   --   --  11  --   --   CREATININE 0.54   < >  --  0.60 0.52 0.62  --   --  0.62  --  0.66  CALCIUM 7.9*  --   --  8.0* 8.3* 8.4*  --   --  9.2  --   --   MG  --   --   --  2.1  --   --  1.8 1.7  --   --   --   AST  --   --   --  22  --   --   --   --   --  15  --   ALT  --   --   --  33  --   --   --   --   --  24  --   ALKPHOS  --   --   --  50  --   --   --   --   --  60  --   BILITOT  --   --   --  1.9*  --   --   --   --   --  1.4*  --    < > = values in this interval not displayed.   ------------------------------------------------------------------------------------------------------------------ No results for input(s): CHOL, HDL, LDLCALC, TRIG, CHOLHDL, LDLDIRECT in the last 72 hours.  Lab Results  Component Value Date   HGBA1C 6.2 (H) 12/22/2017   ------------------------------------------------------------------------------------------------------------------ No results for input(s): TSH, T4TOTAL, T3FREE, THYROIDAB in the last 72 hours.  Invalid input(s): FREET3 ------------------------------------------------------------------------------------------------------------------ No results for input(s): VITAMINB12, FOLATE, FERRITIN, TIBC, IRON, RETICCTPCT in the last 72 hours.  Coagulation profile No results for input(s): INR, PROTIME in the last 168 hours.  No results for input(s): DDIMER in the last 72 hours.  Cardiac Enzymes No results for input(s): CKMB, TROPONINI, MYOGLOBIN in the last 168 hours.  Invalid  input(s): CK ------------------------------------------------------------------------------------------------------------------    Component Value Date/Time   BNP 905.0 (H) 10/25/2019 2133    Micro Results No results found for this or any previous visit (from the past 240 hour(s)).  Radiology Reports CT HEAD WO CONTRAST  Result Date: 10/25/2019 CLINICAL DATA:  64 year old female with hypoxia and altered mental status. EXAM: CT HEAD WITHOUT CONTRAST TECHNIQUE: Contiguous axial images were obtained from the base of the skull through the vertex without intravenous contrast. COMPARISON:  Head CT 09/28/2017. FINDINGS: Brain: No midline shift, ventriculomegaly, mass effect, evidence of mass lesion, intracranial hemorrhage or evidence of cortically based acute infarction. Scattered, patchy bilateral cerebral white matter hypodensity appears not significantly changed. Otherwise gray-white matter differentiation remains within normal limits. Vascular: Calcified atherosclerosis at the skull base. Skull: No acute osseous abnormality identified. Sinuses/Orbits: Visualized paranasal sinuses and mastoids are stable and well pneumatized. Other: Visualized orbits and scalp soft tissues are within normal limits. IMPRESSION: 1. No acute intracranial abnormality by CT. 2. Patchy chronic white matter changes, most commonly due to chronic small vessel disease. Electronically Signed   By: Genevie Ann M.D.   On: 10/25/2019 16:10   CT Angio Chest PE W and/or Wo Contrast  Result Date: 10/24/2019 CLINICAL DATA:  Chest pain. EXAM: CT ANGIOGRAPHY CHEST WITH  CONTRAST TECHNIQUE: Multidetector CT imaging of the chest was performed using the standard protocol during bolus administration of intravenous contrast. Multiplanar CT image reconstructions and MIPs were obtained to evaluate the vascular anatomy. CONTRAST:  162mL OMNIPAQUE IOHEXOL 350 MG/ML SOLN COMPARISON:  February 27, 2019. FINDINGS: Cardiovascular: Atherosclerosis of thoracic  aorta is noted without aneurysm or dissection. Status post coronary bypass graft. The patient is status post right upper lobectomy. There is the interval development of thrombus within the most distal portion of the stump of the right upper lobe branch of the right pulmonary artery. No definite embolus is noted. Mild cardiomegaly is noted. No pericardial effusion is noted. Mediastinum/Nodes: Moderate size sliding-type hiatal hernia is noted. Thyroid gland is unremarkable. No adenopathy is noted. Lungs/Pleura: No pneumothorax is noted. Left lung is clear. Moderate size loculated right pleural effusion is noted with associated subsegmental atelectasis of the right lower lobe. Status post right upper lobectomy as described above. Upper Abdomen: No acute abnormality. Musculoskeletal: No chest wall abnormality. No acute or significant osseous findings. Review of the MIP images confirms the above findings. IMPRESSION: 1. Status post right upper lobectomy. Interval development of thrombus within the most distal portion of the residual stump of the right upper lobe branch of the right pulmonary artery. No definite pulmonary embolus is noted. 2. Moderate size loculated right pleural effusion is noted with associated subsegmental atelectasis of the right lower lobe. 3. Moderate size sliding-type hiatal hernia. 4. Aortic atherosclerosis. Aortic Atherosclerosis (ICD10-I70.0). Electronically Signed   By: Marijo Conception M.D.   On: 10/24/2019 15:47   [place] CHEST (PLEURAL EFFUSION)  Result Date: 10/25/2019 CLINICAL DATA:  RIGHT pleural effusion by CT, history of COPD, GERD, RIGHT upper lobe lung cancer post upper lobectomy EXAM: CHEST ULTRASOUND COMPARISON:  CT angio chest 10/24/2019 FINDINGS: Small pleural effusion identified at the RIGHT lung base. Effusion is partially loculated. Atelectatic RIGHT lower lobe is adherent to the posterior chest wall and prevents safe access of the inferior thoracic cavity for thoracentesis. This  appearance was seen on the prior CT but appears fixed with change in position on ultrasound. IMPRESSION: Small loculated RIGHT pleural effusion, unable to safely access for thoracentesis. Electronically Signed   By: Lavonia Dana M.D.   On: 10/25/2019 11:00   CT CHEST LIMITED WO CONTRAST  Result Date: 11/02/2019 INDICATION: Loculated right-sided pleural effusion. Please perform image guided chest tube placement for infection source control purposes. Note, there is attempt earlier this week to perform image guided chest tube placement however the patient could not tolerate the examination secondary to altered mental status. EXAM: LIMITED CHEST CT COMPARISON:  Chest CT-10/24/2019 MEDICATIONS: None ANESTHESIA/SEDATION: None CONTRAST:  None COMPLICATIONS: None immediate. PROCEDURE: Informed written consent was obtained from the patient after a discussion of the risks, benefits and alternatives to treatment. The patient was placed supine, slightly LPO on the CT gantry and a pre procedural CT was performed demonstrating significant reduction, near complete resolution of residual trace right-sided pleural effusion with minimal amount of fluid seen at the level of the right lung apex and posteromedial aspect of the right lung base, too small to warrant CT-guided chest tube placement. Above was discussed with referring cardiothoracic surgeon, Dr. Kipp Brood, the decision was made not to proceed with intervention at this time. IMPRESSION: Significant reduction/near resolution of residual trace right-sided effusion, too small to warrant CT-guided chest tube placement. No procedure performed. Electronically Signed   By: Sandi Mariscal M.D.   On: 11/02/2019 10:43   DG CHEST  PORT 1 VIEW  Result Date: 11/02/2019 CLINICAL DATA:  Pleural effusion EXAM: PORTABLE CHEST 1 VIEW COMPARISON:  10/31/2019 FINDINGS: Prior median sternotomy and CABG. Heart is normal size. Bibasilar airspace opacities, right greater than left with some  improvement on the right since prior study. No effusions or pneumothorax. IMPRESSION: Bilateral lower lobe airspace opacities, right greater than left with some improvement in aeration at the right base since prior study. Electronically Signed   By: Rolm Baptise M.D.   On: 11/02/2019 11:22   DG CHEST PORT 1 VIEW  Result Date: 10/31/2019 CLINICAL DATA:  Shortness of breath EXAM: PORTABLE CHEST 1 VIEW COMPARISON:  October 29, 2019 chest radiograph and chest CT October 24, 2019 FINDINGS: There is a persistent fairly small right pleural effusion. There is ill-defined airspace opacity in the right base with apparent postoperative change in this area. Elsewhere, there is mild interstitial edema. There is cardiomegaly with pulmonary venous hypertension. Patient is status post internal mammary bypass grafting. No adenopathy. There is calcification in the aortic arch as well as in the left carotid artery. IMPRESSION: Cardiomegaly with pulmonary vascular congestion. Interstitial pulmonary edema with right pleural effusion. These are findings felt to be indicative of a degree of underlying congestive heart failure. There is superimposed airspace opacity in the right base which may represent alveolar edema or a degree of superimposed pneumonia. Postoperative changes noted. There is left carotid artery calcification. Aortic Atherosclerosis (ICD10-I70.0). Electronically Signed   By: Lowella Grip III M.D.   On: 10/31/2019 08:11   DG CHEST PORT 1 VIEW  Result Date: 10/29/2019 CLINICAL DATA:  Shortness of breath, respiratory failure, pleural effusion EXAM: PORTABLE CHEST 1 VIEW COMPARISON:  CTA chest dated 10/24/2019 FINDINGS: Small to moderate right pleural effusion, possibly mildly improved. Associated right lower lobe opacity, likely atelectasis. Postsurgical changes in the right upper hemithorax. Left lung is clear. No frank interstitial edema. Cardiomegaly. Postsurgical changes related to prior CABG. Median  sternotomy. IMPRESSION: Small to moderate right pleural effusion, possibly mildly improved. Postsurgical changes in the right upper hemithorax. Electronically Signed   By: Julian Hy M.D.   On: 10/29/2019 08:16   DG Chest Portable 1 View  Result Date: 10/24/2019 CLINICAL DATA:  Shortness of breath with fever and chills EXAM: PORTABLE CHEST 1 VIEW COMPARISON:  May 25, 2019 FINDINGS: There is a small right pleural effusion. There is postoperative change with scarring on the right. There is no edema or airspace opacity. Heart is upper normal in size with pulmonary vascularity normal. Patient is status post internal mammary bypass grafting. No adenopathy. No bone lesions. IMPRESSION: Small right pleural effusion. Scarring with volume loss on the right. No frank edema or airspace opacity. Stable cardiac silhouette. Postoperative changes noted. Electronically Signed   By: Lowella Grip III M.D.   On: 10/24/2019 13:32   ECHOCARDIOGRAM COMPLETE  Result Date: 11/04/2019    ECHOCARDIOGRAM REPORT   Patient Name:   Emily Rocha Date of Exam: 11/04/2019 Medical Rec #:  [id number]    Height:       65.0 in Accession #:    [id number]   Weight:       182.2 lb Date of Birth:  Sep 09, 1955    BSA:          1.901 m Patient Age:    23 years     BP:           119/52 mmHg Patient Gender: F  HR:           90 bpm. Exam Location:  Inpatient Procedure: 2D Echo, Cardiac Doppler and Color Doppler Indications:    Aortic Stenosis 424.1/135.0  History:        Patient has prior history of Echocardiogram examinations, most                 recent 10/27/2019. CAD, Stroke and COPD; Signs/Symptoms:Shortness                 of Breath.  Sonographer:    Bernadene Person RDCS Referring Phys: [id number] [place]  1. Left ventricular ejection fraction, by estimation, is 60 to 65%. The left ventricle has normal function. The left ventricle has no regional wall motion abnormalities. Left ventricular diastolic parameters are  consistent with Grade II diastolic dysfunction (pseudonormalization). Elevated left ventricular end-diastolic pressure.  2. Right ventricular systolic function is normal. The right ventricular size is normal. There is normal pulmonary artery systolic pressure.  3. Left atrial size was moderately dilated.  4. The mitral valve is degenerative. Mild mitral valve regurgitation. No evidence of mitral stenosis. Moderate mitral annular calcification.  5. The aortic valve was not well visualized. There is moderate calcification of the aortic valve. There is severe thickening of the aortic valve. Aortic valve regurgitation is mild. Moderate to severe aortic valve stenosis. Aortic valve area, by VTI measures 1.03 cm. Aortic valve mean gradient measures 37.0 mmHg. Aortic valve Vmax measures 3.93 m/s.  6. The inferior vena cava is normal in size with greater than 50% respiratory variability, suggesting right atrial pressure of 3 mmHg. FINDINGS  Left Ventricle: Left ventricular ejection fraction, by estimation, is 60 to 65%. The left ventricle has normal function. The left ventricle has no regional wall motion abnormalities. The left ventricular internal cavity size was normal in size. There is  no left ventricular hypertrophy. Left ventricular diastolic parameters are consistent with Grade II diastolic dysfunction (pseudonormalization). Elevated left ventricular end-diastolic pressure. Right Ventricle: The right ventricular size is normal. No increase in right ventricular wall thickness. Right ventricular systolic function is normal. There is normal pulmonary artery systolic pressure. The tricuspid regurgitant velocity is 2.64 m/s, and  with an assumed right atrial pressure of 3 mmHg, the estimated right ventricular systolic pressure is 86.7 mmHg. Left Atrium: Left atrial size was moderately dilated. Right Atrium: Right atrial size was normal in size. Pericardium: There is no evidence of pericardial effusion. Mitral Valve: The  mitral valve is degenerative in appearance. There is mild calcification of the anterior mitral valve leaflet(s). Moderate mitral annular calcification. Mild mitral valve regurgitation. No evidence of mitral valve stenosis. Tricuspid Valve: The tricuspid valve is normal in structure. Tricuspid valve regurgitation is mild . No evidence of tricuspid stenosis. Aortic Valve: The aortic valve was not well visualized. There is moderate calcification of the aortic valve. There is severe thickening of the aortic valve. Aortic valve regurgitation is mild. Moderate to severe aortic stenosis is present. Aortic valve mean gradient measures 37.0 mmHg. Aortic valve peak gradient measures 61.8 mmHg. Aortic valve area, by VTI measures 1.03 cm. Pulmonic Valve: The pulmonic valve was normal in structure. Pulmonic valve regurgitation is not visualized. No evidence of pulmonic stenosis. Aorta: The aortic root is normal in size and structure. Venous: The inferior vena cava is normal in size with greater than 50% respiratory variability, suggesting right atrial pressure of 3 mmHg. IAS/Shunts: No atrial level shunt detected by color flow Doppler.  LEFT VENTRICLE  PLAX 2D LVIDd:         4.70 cm  Diastology LVIDs:         3.30 cm  LV e' medial:    6.40 cm/s LV PW:         0.90 cm  LV E/e' medial:  18.3 LV IVS:        0.80 cm  LV e' lateral:   10.30 cm/s LVOT diam:     2.10 cm  LV E/e' lateral: 11.4 LV SV:         82 LV SV Index:   43 LVOT Area:     3.46 cm  RIGHT VENTRICLE RV S prime:     8.55 cm/s TAPSE (M-mode): 1.8 cm LEFT ATRIUM             Index       RIGHT ATRIUM           Index LA diam:        4.50 cm 2.37 cm/m  RA Area:     14.60 cm LA Vol (A2C):   75.9 ml 39.92 ml/m RA Volume:   33.10 ml  17.41 ml/m LA Vol (A4C):   84.4 ml 44.39 ml/m LA Biplane Vol: 79.9 ml 42.02 ml/m  AORTIC VALVE AV Area (Vmax):    0.93 cm AV Area (Vmean):   0.91 cm AV Area (VTI):     1.03 cm AV Vmax:           393.00 cm/s AV Vmean:          264.750 cm/s  AV VTI:            0.800 m AV Peak Grad:      61.8 mmHg AV Mean Grad:      37.0 mmHg LVOT Vmax:         106.00 cm/s LVOT Vmean:        69.500 cm/s LVOT VTI:          0.238 m LVOT/AV VTI ratio: 0.30  AORTA Ao Root diam: 2.80 cm Ao Asc diam:  2.70 cm MITRAL VALVE                TRICUSPID VALVE MV Area (PHT): 2.99 cm     TR Peak grad:   27.9 mmHg MV Decel Time: 254 msec     TR Vmax:        264.00 cm/s MV E velocity: 117.00 cm/s MV A velocity: 112.00 cm/s  SHUNTS MV E/A ratio:  1.04         Systemic VTI:  0.24 m                             Systemic Diam: 2.10 cm Fransico Him MD Electronically signed by Fransico Him MD Signature Date/Time: 11/04/2019/3:04:04 PM    Final    CT IMAGE GUIDED DRAINAGE BY PERCUTANEOUS CATHETER  Result Date: 11/02/2019 INDICATION: Loculated right-sided pleural effusion. Please perform image guided chest tube placement for infection source control purposes. Note, there is attempt earlier this week to perform image guided chest tube placement however the patient could not tolerate the examination secondary to altered mental status. EXAM: LIMITED CHEST CT COMPARISON:  Chest CT-10/24/2019 MEDICATIONS: None ANESTHESIA/SEDATION: None CONTRAST:  None COMPLICATIONS: None immediate. PROCEDURE: Informed written consent was obtained from the patient after a discussion of the risks, benefits and alternatives to treatment. The patient was placed supine, slightly LPO on the CT gantry  and a pre procedural CT was performed demonstrating significant reduction, near complete resolution of residual trace right-sided pleural effusion with minimal amount of fluid seen at the level of the right lung apex and posteromedial aspect of the right lung base, too small to warrant CT-guided chest tube placement. Above was discussed with referring cardiothoracic surgeon, Dr. Kipp Brood, the decision was made not to proceed with intervention at this time. IMPRESSION: Significant reduction/near resolution of residual trace  right-sided effusion, too small to warrant CT-guided chest tube placement. No procedure performed. Electronically Signed   By: Sandi Mariscal M.D.   On: 11/02/2019 10:43   VAS [place] LOWER EXTREMITY VENOUS (DVT)  Result Date: 10/29/2019  Lower Venous DVTStudy Indications: Pulmonary embolism, and See Previous Cat Scan.  Risk Factors: Suspected PE. Limitations: Limited due to patient movement. Performing Technologist: Griffin Basil RCT RDMS  Examination Guidelines: A complete evaluation includes B-mode imaging, spectral Doppler, color Doppler, and power Doppler as needed of all accessible portions of each vessel. Bilateral testing is considered an integral part of a complete examination. Limited examinations for reoccurring indications may be performed as noted. The reflux portion of the exam is performed with the patient in reverse Trendelenburg.  +---------+---------------+---------+-----------+----------+--------------+ RIGHT    CompressibilityPhasicitySpontaneityPropertiesThrombus Aging +---------+---------------+---------+-----------+----------+--------------+ CFV      Full           Yes      Yes                                 +---------+---------------+---------+-----------+----------+--------------+ SFJ      Full                                                        +---------+---------------+---------+-----------+----------+--------------+ FV Prox  Full                                                        +---------+---------------+---------+-----------+----------+--------------+ FV Mid   Full                                                        +---------+---------------+---------+-----------+----------+--------------+ FV DistalFull                                                        +---------+---------------+---------+-----------+----------+--------------+ PFV      Full                                                         +---------+---------------+---------+-----------+----------+--------------+ POP      Full  Yes                                 +---------+---------------+---------+-----------+----------+--------------+ PTV      Full                                                        +---------+---------------+---------+-----------+----------+--------------+ PERO     Full                                                        +---------+---------------+---------+-----------+----------+--------------+   +---------+---------------+---------+-----------+----------+------------------+ LEFT     CompressibilityPhasicitySpontaneityPropertiesThrombus Aging     +---------+---------------+---------+-----------+----------+------------------+ CFV      Full           Yes      Yes                                     +---------+---------------+---------+-----------+----------+------------------+ SFJ      Full                                                            +---------+---------------+---------+-----------+----------+------------------+ FV Prox  Full                                                            +---------+---------------+---------+-----------+----------+------------------+ FV Mid   Full                                                            +---------+---------------+---------+-----------+----------+------------------+ FV DistalFull                                                            +---------+---------------+---------+-----------+----------+------------------+ PFV      Full                                                            +---------+---------------+---------+-----------+----------+------------------+ POP      Full                                                            +---------+---------------+---------+-----------+----------+------------------+  PTV      Full                                                             +---------+---------------+---------+-----------+----------+------------------+ PERO     Full                                         Limited                                                                  visualization      +---------+---------------+---------+-----------+----------+------------------+     Summary: RIGHT: - There is no evidence of deep vein thrombosis in the lower extremity.  - No cystic structure found in the popliteal fossa.  LEFT: - There is no evidence of deep vein thrombosis in the lower extremity.  - No cystic structure found in the popliteal fossa.  *See table(s) above for measurements and observations. Electronically signed by Servando Snare MD on 10/29/2019 at 1:01:37 PM.    Final    ECHOCARDIOGRAM LIMITED  Result Date: 10/27/2019    ECHOCARDIOGRAM LIMITED REPORT   Patient Name:   Emily Rocha Date of Exam: 10/27/2019 Medical Rec #:  [id number]    Height:       65.0 in Accession #:    [id number]   Weight:       196.6 lb Date of Birth:  [birth year]    BSA:          1.964 m Patient Age:    46 years     BP:           148/66 mmHg Patient Gender: F            HR:           84 bpm. Exam Location:  Inpatient Procedure: Limited Echo, Limited Color Doppler and Cardiac Doppler Indications:     Dyspnea 786.09 / R06.00  History:         Patient has prior history of Echocardiogram examinations, most                  recent 08/10/2019. CAD; Prior CABG. Previous Myocardial                  Infarction, Prior                  CABG, COPD; Signs/Symptoms:Shortness of Breath. 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                  Parapneumonic right-sided effusion.  Sonographer:     Darlina Sicilian RDCS Referring Phys:  [id number] COURAGE EMOKPAE Diagnosing Phys: Fransico Him MD  Sonographer Comments: Image acquisition challenging due to patient behavioral factors. IMPRESSIONS  1. Left ventricular endocardial border not optimally  defined to evaluate regional wall motion. Left ventricular diastolic  function could not be evaluated.Patient was not cooperative with exam.  2. Right ventricular systolic function was not well visualized. The right ventricular size is not well visualized.  3. The mitral valve is normal in structure. Mild mitral valve regurgitation. No evidence of mitral stenosis. Moderate mitral annular calcification.  4. The aortic valve is calcified. There is severe calcifcation of the aortic valve. There is moderate thickening of the aortic valve. Aortic valve regurgitation is not visualized. There is likely severe AS but study not sufficient for adequate assessment.  5. Recommend repeat 2D echo full study once patient able to cooperate. FINDINGS  Left Ventricle: Left ventricular endocardial border not optimally defined to evaluate regional wall motion. The left ventricular internal cavity size was normal in size. There is no left ventricular hypertrophy. Left ventricular diastolic function could  not be evaluated. Right Ventricle: The right ventricular size is not well visualized. Right vetricular wall thickness was not assessed. Right ventricular systolic function was not well visualized. Left Atrium: Left atrial size was not well visualized. Right Atrium: Right atrial size was not well visualized. Pericardium: There is no evidence of pericardial effusion. Mitral Valve: The mitral valve is normal in structure. Moderate mitral annular calcification. Mild mitral valve regurgitation. No evidence of mitral valve stenosis. Tricuspid Valve: The tricuspid valve is normal in structure. Tricuspid valve regurgitation is mild . No evidence of tricuspid stenosis. Aortic Valve: The aortic valve is calcified. There is severe calcifcation of the aortic valve. There is moderate thickening of the aortic valve. Aortic valve regurgitation is not visualized. There is likely severe AS but study not sufficient for adequate  assessment. Pulmonic  Valve: The pulmonic valve was not assessed. Aorta: The aortic root is normal in size and structure. IAS/Shunts: No atrial level shunt detected by color flow Doppler. LEFT VENTRICLE PLAX 2D LVOT diam:     1.90 cm LVOT Area:     2.84 cm   AORTA Ao Root diam: 2.80 cm  SHUNTS Systemic Diam: 1.90 cm Fransico Him MD Electronically signed by Fransico Him MD Signature Date/Time: 10/27/2019/2:08:25 PM    Final (Updated)    [place] EKG SITE RITE  Result Date: 10/28/2019 If Site Rite image not attached, placement could not be confirmed due to current cardiac rhythm.    Time Spent in minutes  > 30     Desiree Hane M.D on 11/05/2019 at 9:52 AM  To page go to [email or web address] - password [facility]

## 2019-11-05 NOTE — Progress Notes (Signed)
SATURATION QUALIFICATIONS: (This note is used to comply with regulatory documentation for home oxygen)  Patient Saturations on Room Air at Rest = 92%  Patient Saturations on Room Air while Ambulating = 85%  Patient Saturations on 3 Liters of oxygen while Ambulating = 92%  Please briefly explain why patient needs home oxygen:Pt requires up to 3L O2 with activity to maintain sats >92%.   Thi Klich W,PT Acute Rehabilitation Services Pager:  [phone]  Office:  [phone]

## 2019-11-05 NOTE — Discharge Instructions (Signed)
Information on my medicine - ELIQUIS (apixaban)  Why was Eliquis prescribed for you? Eliquis was prescribed to treat blood clots that may have been found in the veins of your legs (deep vein thrombosis) or in your lungs (pulmonary embolism) and to reduce the risk of them occurring again.  What do You need to know about Eliquis ? The starting dose is 10 mg (two 5 mg tablets) taken TWICE daily for the FIRST SEVEN (7) DAYS, then on 11/12/19 the dose is reduced to ONE 5 mg tablet taken TWICE daily.  Eliquis may be taken with or without food.   Try to take the dose about the same time in the morning and in the evening. If you have difficulty swallowing the tablet whole please discuss with your pharmacist how to take the medication safely.  Take Eliquis exactly as prescribed and DO NOT stop taking Eliquis without talking to the doctor who prescribed the medication.  Stopping may increase your risk of developing a new blood clot.  Refill your prescription before you run out.  After discharge, you should have regular check-up appointments with your healthcare provider that is prescribing your Eliquis.    What do you do if you miss a dose? If a dose of ELIQUIS is not taken at the scheduled time, take it as soon as possible on the same day and twice-daily administration should be resumed. The dose should not be doubled to make up for a missed dose.  Important Safety Information A possible side effect of Eliquis is bleeding. You should call your healthcare provider right away if you experience any of the following: ? Bleeding from an injury or your nose that does not stop. ? Unusual colored urine (red or dark brown) or unusual colored stools (red or black). ? Unusual bruising for unknown reasons. ? A serious fall or if you hit your head (even if there is no bleeding).  Some medicines may interact with Eliquis and might increase your risk of bleeding or clotting while on Eliquis. To help avoid  this, consult your healthcare provider or pharmacist prior to using any new prescription or non-prescription medications, including herbals, vitamins, non-steroidal anti-inflammatory drugs (NSAIDs) and supplements.  This website has more information on Eliquis (apixaban): http://www.eliquis.com/eliquis/home

## 2019-11-05 NOTE — Progress Notes (Signed)
Occupational Therapy Treatment Note  Pt making steady progress toward goals. Continue to recommend follow up with HHOT to maximize functional level of independence with ADL and IADL tasks. Recommend 24/7 S and S with all medication management. Pt desats on 2L to 85 during ADL however quickly rebounds to 90s with pursed lip breathing. Began educating pt on energy conservation strategies. Will continue to follow acutely.     11/05/19 1300  OT Visit Information  Last OT Received On 11/05/19  Assistance Needed +1  History of Present Illness Pt is 64 yo admitted to [facility] on 9/8 with SOB found to have loculated pleural effusion with progressive confusion and transferred to [facility] 9/11. Pt with metabolic encephalopathy and unable to tolerate IR place chest tube 9/13. Plan was for chest tube placement 11/02/19 - but pleural effusion to small and procedure not performed. PMhx: Right upper lobectomy due to lung CA 10/2018, severe AS, hiatal hernia, COPD, CAD  Precautions  Precautions Fall  Precaution Comments watch sats  Pain Assessment  Pain Assessment No/denies pain  Cognition  Arousal/Alertness Awake/alert  Behavior During Therapy Impulsive  Overall Cognitive Status No family/caregiver present to determine baseline cognitive functioning  Current Attention Level Selective  Safety/Judgement Decreased awareness of safety  Upper Extremity Assessment  Upper Extremity Assessment Generalized weakness  ADL  Functional mobility during ADLs Min guard  General ADL Comments Minguard with LB ADL adn set up for UB ADL; Began education regarding energy conservation techniuqes; Pt able to verbalize understanding however requires VC to follow  Bed Mobility  General bed mobility comments OOB in chair  Balance  Sitting balance-Leahy Scale Good  Standing balance-Leahy Scale Good  Transfers  Overall transfer level Needs assistance  Transfers Sit to/from Stand;Stand Pivot Transfers  Sit to Stand Min guard  Stand  pivot transfers Min guard  General transfer comment More difficulty from lower surfaces; recommend use of 3in1 over toilet to increase independence  Other Exercises  Other Exercises pursed lip breathing  OT - End of Session  Equipment Utilized During Treatment Oxygen (2L)  Activity Tolerance Patient tolerated treatment well  Patient left in chair;with call bell/phone within reach  Nurse Communication Mobility status;Other (comment) (DC needs)  OT Assessment/Plan  OT Plan Discharge plan remains appropriate  OT Visit Diagnosis Unsteadiness on feet (R26.81);Other abnormalities of gait and mobility (R26.89);Muscle weakness (generalized) (M62.81);Cognitive communication deficit (R41.841);Pain  OT Frequency (ACUTE ONLY) Min 2X/week  Follow Up Recommendations Home health OT;Supervision/Assistance - 24 hour  OT Equipment None recommended by OT (pt planning to borrow 3in1 from friend)  AM-PAC OT "6 Clicks" Daily Activity Outcome Measure (Version 2)  Help from another person eating meals? 4  Help from another person taking care of personal grooming? 3  Help from another person toileting, which includes using toliet, bedpan, or urinal? 3  Help from another person bathing (including washing, rinsing, drying)? 3  Help from another person to put on and taking off regular upper body clothing? 3  Help from another person to put on and taking off regular lower body clothing? 3  6 Click Score 19  OT Goal Progression  Progress towards OT goals Progressing toward goals  Acute Rehab OT Goals  Patient Stated Goal to breathe better and go home  OT Goal Formulation With patient  Time For Goal Achievement 11/13/19  Potential to Achieve Goals Good  ADL Goals  Pt Will Perform Grooming with supervision;standing  Pt Will Perform Upper Body Dressing with supervision;sitting  Pt Will Perform Lower Body  Dressing with supervision;sit to/from stand  Pt Will Transfer to Toilet with supervision;ambulating;regular  height toilet  Pt Will Perform Toileting - Clothing Manipulation and hygiene with supervision;sit to/from stand  Pt Will Perform Tub/Shower Transfer Shower transfer;with supervision;ambulating  Additional ADL Goal #1 Pt will be oriented x 4 using compensatory strategies as needed.  Additional ADL Goal #2 Pt will recall at least 3 energy conservation strategies as instructed.  OT Time Calculation  OT Start Time (ACUTE ONLY) 1220  OT Stop Time (ACUTE ONLY) 1255  OT Time Calculation (min) 35 min  OT General Charges  $OT Visit 1 Visit  OT Treatments  $Self Care/Home Management  23-37 mins  Maurie Boettcher, OT/L   Acute OT Clinical Specialist [place] Pager [phone] Office [phone]

## 2019-11-05 NOTE — Care Management Important Message (Signed)
Important Message  Patient Details  Name: Emily Rocha MRN: [id number] Date of Birth: 29-Apr-1955   Medicare Important Message Given:  Yes     Shelda Altes 11/05/2019, 11:32 AM

## 2019-11-06 ENCOUNTER — Encounter (HOSPITAL_COMMUNITY): Payer: Medicare HMO

## 2019-11-06 LAB — CBC
HCT: 33.3 % — ABNORMAL LOW (ref 36.0–46.0)
Hemoglobin: 10 g/dL — ABNORMAL LOW (ref 12.0–15.0)
MCH: 27.2 pg (ref 26.0–34.0)
MCHC: 30 g/dL (ref 30.0–36.0)
MCV: 90.5 fL (ref 80.0–100.0)
Platelets: 175 10*3/uL (ref 150–400)
RBC: 3.68 MIL/uL — ABNORMAL LOW (ref 3.87–5.11)
RDW: 15.9 % — ABNORMAL HIGH (ref 11.5–15.5)
WBC: 9.3 10*3/uL (ref 4.0–10.5)
nRBC: 0 % (ref 0.0–0.2)

## 2019-11-06 MED ORDER — MOMETASONE FURO-FORMOTEROL FUM 200-5 MCG/ACT IN AERO: 2.0000 | INHALATION_SPRAY | Freq: Two times a day (BID) | RESPIRATORY_TRACT | 2 refills | Status: DC

## 2019-11-06 MED ORDER — GUAIFENESIN ER 600 MG PO TB12
600.0000 mg | ORAL_TABLET | Freq: Two times a day (BID) | ORAL | 1 refills | Status: DC | PRN
Start: 2019-11-06 — End: 2020-07-16

## 2019-11-06 MED ORDER — APIXABAN (ELIQUIS) VTE STARTER PACK (10MG AND 5MG): ORAL_TABLET | ORAL | 0 refills | Status: DC

## 2019-11-06 MED ORDER — QUETIAPINE FUMARATE 25 MG PO TABS
25.0000 mg | ORAL_TABLET | Freq: Every day | ORAL | 0 refills | Status: DC
Start: 2019-11-06 — End: 2020-01-25

## 2019-11-06 MED ORDER — STIOLTO RESPIMAT 2.5-2.5 MCG/ACT IN AERS: 2.0000 | INHALATION_SPRAY | Freq: Every day | RESPIRATORY_TRACT | 4 refills | Status: DC

## 2019-11-06 MED FILL — ELIQUIS STARTER PACK 5 MG T: 5 | 30 days supply | Qty: 74 | Fill #0

## 2019-11-06 MED FILL — MUCUS RELIEF 600 MG TB12: 600 | 10 days supply | Qty: 20 | Fill #0

## 2019-11-06 MED FILL — QUETIAPINE FUMARATE 25 MG T: 25 | 30 days supply | Qty: 30 | Fill #0

## 2019-11-06 MED FILL — STIOLTO RESPIMAT INHAL SPRY: 2.5-2.5 | 30 days supply | Qty: 4 | Fill #0

## 2019-11-06 NOTE — Progress Notes (Signed)
Discharge instructions given to patient. IV removed, clean and intact. Medications reviewed, all questions answered. Pt escorted home with husband. ° °Linh Johannes R Kenya Kook, RN  °

## 2019-11-06 NOTE — Discharge Summary (Signed)
Emily Rocha UEA:[id number] DOB: [birth year] DOA: 10/24/2019  PCP: Glenda Chroman, MD  Admit date: 10/24/2019 Discharge date: 11/06/2019  Admitted From: home Disposition:  home  Recommendations for Outpatient Follow-up:  1. Follow up with PCP in 1-2 weeks 2. New medications: Dulera, eliquis starter pack 3. Follow up with Dr. Kipp Brood as outpatient regarding hiatal hernia repair.  4. Surveillance imaging s/p remote lobectomy 5.   Home Health: PT/OT Equipment/Devices:3 L O2   Discharge Condition:Stable  CODE STATUS:FULL    Brief/Interim Summary: History of present illness:  Emily Rocha is a 64 y.o. year old female with medical history significant for COPD, coronary artery disease, adenocarcinoma of the right lung requiring VATS and right upper lobe lobectomy (2018), also newly identified hiatal hernia admitted initially with pneumonia and what appeared to be complicated right pleural effusion at [facility] subsequently transferred to [facility] on 9/11 due to acute hypoxic and hypercapnic respiratory failure related to loculated right-sided pleural effusion.  Patient was started on Unasyn, azithromycin, vancomycin due to concern for pneumonia. She underwent unsuccessful attempt at ultrasound-guided thoracentesis on 10/25/2019 due to pain.  Patient was transferred to [facility] for CT surgery evaluation in order to rule out infection/possible empyema on 9/11.   Hospital course was complicated by metabolic encephalopathy related to hypercarbia, hyponatremia, infection and hospital-acquired delirium which ultimately required Precedex and monitoring in ICU by critical care team, COPD exacerbation requiring IV steroids and scheduled inhalers and BiPAP, and incidental finding of right pulmonary artery surgical stump thrombus for which she was started on IV heparin.  She was unable to have placement of chest tube by IR on 9/13 for loculated right effusion given due to agitation  related to encephalopathy    Remaining hospital course addressed in problem based format below:   Hospital Course:   Acute hypoxic/hypercarbic respiratory failure, multifactorial etiology including  pneumonia complicated by loculated right-sided pleural effusion, COPD exacerbation, improving  Stable on 2 -3L O2 saturations > 95%.  Underwent ambulatory O2 testing and showed oxygen desaturation without supplemental O2 -Continue Dulera for COPD, completed steroid regimen -completed Augmentin for presumed aspiration pneumonia in setting of hiatal hernia (Unasyn, vancomycin day 7--given positive MRSA PCR screen--now discontinued) - pulmonary toilet with incentive spirometry and flutter valve.  --O2 provided on discharge. 2 L at rest, 3 L with ambulation  Loculated right-sided pleural effusion, essentially resoled  Presumed to be possible empyema however patient was never able to have any chest tube placed due to agitation/metabolic encephalopathy. Repeat CT chest on 9/17 shows significant reduction with only residual trace pleural effusion too small for chest tube placement -CT surgery agreed no need for chest tube placement and recommends continue pulmonary toilet  Aspiration pneumonia in the setting of hiatal hernia versus bilateral pneumonia given bilateral opacities on prior chest x-rays, stable. Blood cultures negative. Sputum culture no growth. MRSA PCR positive on 9/9.   Unasyn since 9/8 and vancomycin since 9/10 followed by augmentin to complete full 13 day course. Afebrile, no leukocytosis and resolved effusion on CXR.  -Incentive spirometer, flutter valve, continue out of bed to chair --continue supplemental O2 as mentioned above  Acute pulmonary artery thrombus, incidentally found in right pulmonary artery in distal portion of residual stump of RUL branch of right pulmonary artery on CTA chest, no PE noted. Was on IV heparin during hospitalization and transitioned to eliquis on 9/19 and  tolerated it.  -Continue eliquis starter pack  - systemic anticoagulant for next 3  to 6 months, as recommended by pulmonology  COPD, stable. No wheezing.  She was initially started on IV Solu-Medrol 5 mg twice daily on 9/9 x 3 days, then transitioned to IV Solu-Medrol 20 mg twice daily from 9/13-9/16, followed by IV Solumedrol 20 mg daily from 9/17-9/18, and then completed prednisone burst x 5 days -Continue home Dulera twice daily,   CHF with preserved EF, grade 1 diastolic dysfunction Aortic stenosis, history of moderate ( degree not able to be determined on limited TTE on 9/11) euvolemic on exam TTE shows preserved EF with moderate to severe aortic valve stenosis with mean gradient of 37 mmHg -Torsemide 20 mg daily, lopressor --followed by Dr Harl Bowie as outpatient  Left shoulder pain, MSK in etiology, improved.  Reproducible on exam when lifting left arm, noted mostly when coughing.  Improved with Tylenol -Continue Tylenol as needed   Hypokalemia, resolved Mg wnl  Agitation, likely hospital-acquired delirium due to prolonged hospitalization further complicated by metabolic insults (metabolic/infectious encephalopathy including hypercarbia, infection, hyponatremia), resolved. Alert and oriented x4. She does intermittently have confusion typically later night early morning consistent with hospital-acquired delirium. On day of discharge no issues with mentation -Precedex drip discontinued on 9/17  -Continue Seroquel, Xanax 3 times daily (home medication) -Steroids course completed in hospital  CAD (prior CABG 2018, stable --continue aspirin, Imdur   Hiatal hernia. Stable -Repair to be discussed as an outpatient with Dr. Kipp Brood --continue PPI  Remote history of adenocarcinoma the lung status post right upper lobe lobectomy (2018) -Continue outpatient follow-up and surveillance   Consultations:  PCCM, Cardiothoracic Surgery, Interventional  Radiology  Procedures/Studies: TTE, 11/04/19  1. Left ventricular ejection fraction, by estimation, is 60 to 65%. The  left ventricle has normal function. The left ventricle has no regional  wall motion abnormalities. Left ventricular diastolic parameters are  consistent with Grade II diastolic  dysfunction (pseudonormalization). Elevated left ventricular end-diastolic  pressure.  2. Right ventricular systolic function is normal. The right ventricular  size is normal. There is normal pulmonary artery systolic pressure.  3. Left atrial size was moderately dilated.  4. The mitral valve is degenerative. Mild mitral valve regurgitation. No  evidence of mitral stenosis. Moderate mitral annular calcification.  5. The aortic valve was not well visualized. There is moderate  calcification of the aortic valve. There is severe thickening of the  aortic valve. Aortic valve regurgitation is mild. Moderate to severe  aortic valve stenosis. Aortic valve area, by VTI  measures 1.03 cm. Aortic valve mean gradient measures 37.0 mmHg. Aortic  valve Vmax measures 3.93 m/s.  6. The inferior vena cava is normal in size with greater than 50%  respiratory variability, suggesting right atrial pressure of 3 mmHg.  DVT bilateral venous ultrasound, 10/29/19  Summary:  RIGHT:  - There is no evidence of deep vein thrombosis in the lower extremity.    - No cystic structure found in the popliteal fossa.    LEFT:  - There is no evidence of deep vein thrombosis in the lower extremity.    - No cystic structure found in the popliteal fossa.   Subjective:  Feels well, mild cough. No chest pain or SOB. Ready to go home  Discharge Exam: Vitals:   11/06/19 0823 11/06/19 1123  BP: (!) 120/56 109/61  Pulse: (!) 104 86  Resp: 16 20  Temp: 99.1 F (37.3 C) 98.6 F (37 C)  SpO2: 97% 95%   Vitals:   11/06/19 0454 11/06/19 0815 11/06/19 0823 11/06/19 1123  BP:   (!) 120/56 109/61  Pulse:   (!) 104  86  Resp:   16 20  Temp:   99.1 F (37.3 C) 98.6 F (37 C)  TempSrc:   Oral Oral  SpO2:  95% 97% 95%  Weight: 81.1 kg     Height:        General: Lying in bed, no apparent distress Eyes: EOMI, anicteric ENT: Oral Mucosa clear and moist Cardiovascular: regular rate and rhythm, no murmurs, rubs or gallops, no edema, Respiratory: Normal respiratory effort on 2 L at rest, lungs clear to auscultation bilaterally Abdomen: soft, non-distended, non-tender, normal bowel sounds Skin: No Rash Neurologic: Grossly no focal neuro deficit.Mental status AAOx3, speech normal, Psychiatric:Appropriate affect, and mood  Discharge Diagnoses:  Principal Problem:   Acute respiratory failure with hypoxia ([place]) Active Problems:   COPD exacerbation (HCC)   S/P lobectomy of lung   Rt Sided Pneumonia with loculated parapneumonic effusion   Rt Sided ParaPneumonic Pleural effusion   History of lung cancer- Non-Small cell/S/p Prior VATs   CAD (coronary artery disease)/Prior CABG   Acute respiratory failure (HCC)   Acute metabolic encephalopathy   Aspiration pneumonia (HCC)   Hiatal hernia   Thrombus   Hypokalemia   Shoulder pain    Discharge Instructions  Discharge Instructions    Diet - low sodium heart healthy   Complete by: As directed    Increase activity slowly   Complete by: As directed      Allergies as of 11/06/2019   No Known Allergies     Medication List    STOP taking these medications   Stiolto Respimat 2.5-2.5 MCG/ACT Aers Generic drug: Tiotropium Bromide-Olodaterol     TAKE these medications   albuterol (2.5 MG/3ML) 0.083% nebulizer solution Commonly known as: PROVENTIL Take 2.5 mg by nebulization every 4 (four) hours as needed.   ALPRAZolam 0.5 MG tablet Commonly known as: XANAX Take 0.5 mg by mouth 3 (three) times daily.   Apixaban Starter Pack (10mg  and 5mg ) Commonly known as: ELIQUIS STARTER PACK Take as directed on package: start with two-5mg  tablets twice  daily for 7 days. On day 8, switch to one-5mg  tablet twice daily.   aspirin 81 MG chewable tablet Chew 81 mg by mouth daily.   atorvastatin 40 MG tablet Commonly known as: Lipitor Take 1 tablet (40 mg total) daily by mouth.   calcium carbonate 1250 (500 Ca) MG chewable tablet Commonly known as: OS-CAL Chew 1 tablet by mouth daily.   cholecalciferol 25 MCG (1000 UNIT) tablet Commonly known as: VITAMIN D3 Take 1,000 Units by mouth daily.   diphenhydramine-acetaminophen 25-500 MG Tabs tablet Commonly known as: TYLENOL PM Take 2-3 tablets by mouth at bedtime.   guaiFENesin 600 MG 12 hr tablet Commonly known as: MUCINEX Take 1 tablet (600 mg total) by mouth 2 (two) times daily as needed.   isosorbide mononitrate 30 MG 24 hr tablet Commonly known as: IMDUR Take 0.5 tablets (15 mg total) by mouth daily.   metoprolol succinate 50 MG 24 hr tablet Commonly known as: TOPROL-XL Take 50 mg by mouth daily. Take with or immediately following a meal.   mometasone-formoterol 200-5 MCG/ACT Aero Commonly known as: DULERA Inhale 2 puffs into the lungs 2 (two) times daily.   multivitamin with minerals Tabs tablet Take 1 tablet by mouth daily.   pantoprazole 40 MG tablet Commonly known as: PROTONIX Take 40 mg by mouth daily.   potassium chloride SA 20 MEQ tablet Commonly known as:  KLOR-CON Take 20 mEq by mouth daily.   QUEtiapine 25 MG tablet Commonly known as: SEROQUEL Take 1 tablet (25 mg total) by mouth at bedtime.   torsemide 20 MG tablet Commonly known as: DEMADEX Take 1 tablet (20 mg total) by mouth daily.   Vitamin C Gummie 120 MG Chew Generic drug: Ascorbic Acid Chew 500 mg by mouth.            Durable Medical Equipment  (From admission, onward)         Start     Ordered   11/05/19 1253  For home use only DME oxygen  Once       Question Answer Comment  Length of Need 6 Months   Mode or (Route) Mask   Liters per Minute 3   Frequency Continuous (stationary  and portable oxygen unit needed)   Oxygen delivery system Gas      11/05/19 1252          No Known Allergies      The results of significant diagnostics from this hospitalization (including imaging, microbiology, ancillary and laboratory) are listed below for reference.     Microbiology: No results found for this or any previous visit (from the past 240 hour(s)).   Labs: BNP (last 3 results) Recent Labs    10/25/19 2133  BNP 161.0*   Basic Metabolic Panel: Recent Labs  Lab 10/30/19 1218 10/31/19 0605 11/01/19 0241 11/02/19 0028 11/02/19 0934 11/02/19 1430 11/03/19 0151 11/05/19 0456  NA 134* 135 136 135  --   --  139  --   K 4.8 4.3 4.0 3.0*  --   --  4.1  --   CL  --  94* 89* 89*  --   --  97*  --   CO2  --  32 35* 37*  --   --  33*  --   GLUCOSE  --  110* 92 89  --   --  96  --   BUN  --  16 13 11   --   --  11  --   CREATININE  --  0.60 0.52 0.62  --   --  0.62 0.66  CALCIUM  --  8.0* 8.3* 8.4*  --   --  9.2  --   MG  --  2.1  --   --  1.8 1.7  --   --   PHOS  --   --  3.1  --   --   --   --   --    Liver Function Tests: Recent Labs  Lab 10/31/19 0605 11/01/19 0241 11/03/19 1528  AST 22  --  15  ALT 33  --  24  ALKPHOS 50  --  60  BILITOT 1.9*  --  1.4*  PROT 5.3*  --  5.8*  ALBUMIN 3.0* 3.0* 3.0*   No results for input(s): LIPASE, AMYLASE in the last 168 hours. Recent Labs  Lab 10/31/19 0611 11/03/19 1528  AMMONIA 58* 36*   CBC: Recent Labs  Lab 11/02/19 0028 11/03/19 0151 11/04/19 0647 11/05/19 0456 11/06/19 0127  WBC 9.1 10.1 8.4 7.3 9.3  HGB 10.7* 11.7* 10.5* 10.2* 10.0*  HCT 34.7* 37.9 35.6* 35.0* 33.3*  MCV 87.0 88.6 88.8 90.9 90.5  PLT 255 276 201 188 175   Cardiac Enzymes: No results for input(s): CKTOTAL, CKMB, CKMBINDEX, TROPONINI in the last 168 hours. BNP: Invalid input(s): POCBNP CBG: Recent Labs  Lab 11/04/19 1121 11/04/19 1753  11/04/19 2105 11/05/19 0618 11/05/19 1138  GLUCAP 106* 167* 187* 80 71    D-Dimer No results for input(s): DDIMER in the last 72 hours. Hgb A1c No results for input(s): HGBA1C in the last 72 hours. Lipid Profile No results for input(s): CHOL, HDL, LDLCALC, TRIG, CHOLHDL, LDLDIRECT in the last 72 hours. Thyroid function studies No results for input(s): TSH, T4TOTAL, T3FREE, THYROIDAB in the last 72 hours.  Invalid input(s): FREET3 Anemia work up No results for input(s): VITAMINB12, FOLATE, FERRITIN, TIBC, IRON, RETICCTPCT in the last 72 hours. Urinalysis    Component Value Date/Time   COLORURINE YELLOW 10/24/2018 1540   APPEARANCEUR CLEAR 10/24/2018 1540   LABSPEC 1.012 10/24/2018 1540   PHURINE 5.0 10/24/2018 1540   GLUCOSEU NEGATIVE 10/24/2018 1540   HGBUR NEGATIVE 10/24/2018 1540   BILIRUBINUR NEGATIVE 10/24/2018 1540   KETONESUR NEGATIVE 10/24/2018 1540   PROTEINUR NEGATIVE 10/24/2018 1540   UROBILINOGEN 0.2 12/04/2014 1737   NITRITE NEGATIVE 10/24/2018 1540   LEUKOCYTESUR SMALL (A) 10/24/2018 1540   Sepsis Labs Invalid input(s): PROCALCITONIN,  WBC,  LACTICIDVEN Microbiology No results found for this or any previous visit (from the past 240 hour(s)).   Time coordinating discharge: Over 30 minutes  SIGNED:   Desiree Hane, MD  [facility] 11/06/2019, 12:08 PM Pager   If 7PM-7AM, please contact night-coverage [email or web address] Password [email or web address]

## 2019-11-06 NOTE — TOC Benefit Eligibility Note (Signed)
Transition of Care [facility]) Benefit Eligibility Note    Patient Details  Name: Emily Rocha MRN: [id number] Date of Birth: [birth year]   Medication/Dose: [name]  5 MG BID  Covered?: Yes  Tier: 3 Drug  Prescription Coverage [facility]erred [facility] and  Roseanne Kaufman with Person/Company/Phone Number:: Eilene Ghazi RX # [phone]  Co-Pay: $47.00  Prior Approval: No  Deductible:  (NO DEDUCTIBLE WITH PLAN)  Additional Notes: [employer] : [name] Phone Number: 11/06/2019, 1:00 PM

## 2019-11-06 NOTE — TOC Progression Note (Addendum)
Transition of Care [facility]) - Progression Note    Patient Details  Name: CREASIE LACOSSE MRN: [id number] Date of Birth: May 21, 1955  Transition of Care [facility]) CM/SW Contact  Jilliana Burkes, Edson Snowball, RN Phone Number: 11/06/2019, 11:36 AM  Clinical Narrative:     Update [facility] has everything they needed and will bring oxygen to room. Entered benefit check for Eliquis . TOC pharmacy will use 30 day free card for first 30 day supply.   Lane Hacker with [facility] regarding home oxygen. They spoke to patient and husband yesterday. Patient has a co pay and husband is to call [employer] back today with a card to put on file.   Cory with [facility] aware discharge is today .       Barriers to Discharge: Continued Medical Work up  Expected Discharge Plan and Services                                     HH Arranged: PT [place]: [place] Date [place]: 11/04/19 Time [place]: 1006 Representative spoke with at [place]: [place] ([place]) Interventions    Readmission Risk Interventions No flowsheet data found.

## 2019-11-07 ENCOUNTER — Other Ambulatory Visit: Payer: Self-pay

## 2019-11-07 NOTE — Patient Outreach (Signed)
[place] [facility]) Care Management  11/07/2019  MATAYAH REYBURN 1956/01/16 [id number]   Telephone Assessment    Outreach attempt #  to patient. No answer after multiple rings and unable to leave message.     Plan: RN CM will make outreach attempt to patient within 3-4 business days.   Enzo Montgomery, RN,BSN,CCM [place] Management Telephonic Care Management Coordinator Direct Phone: [phone] Toll Free: [phone] Fax: [phone]

## 2019-11-08 ENCOUNTER — Encounter (HOSPITAL_COMMUNITY): Payer: Medicare HMO

## 2019-11-08 ENCOUNTER — Other Ambulatory Visit: Payer: Self-pay

## 2019-11-08 NOTE — Patient Outreach (Signed)
[place] [facility]) Care Management  11/08/2019  DAWNNA GRITZ [birth year] [id number]   Telephone Assessment   Unsuccessful outreach attempt to patient.     Plan: RN CM will make outreach attempt within 3-4 business days.  Enzo Montgomery, RN,BSN,CCM [place] Management Telephonic Care Management Coordinator Direct Phone: [phone] Toll Free: [phone] Fax: [phone]

## 2019-11-09 ENCOUNTER — Ambulatory Visit: Payer: Medicare HMO | Admitting: Thoracic Surgery (Cardiothoracic Vascular Surgery)

## 2019-11-09 ENCOUNTER — Other Ambulatory Visit: Payer: Medicare HMO

## 2019-11-12 ENCOUNTER — Emergency Department (HOSPITAL_COMMUNITY): Payer: Medicare HMO

## 2019-11-12 ENCOUNTER — Observation Stay (HOSPITAL_COMMUNITY)
Admission: EM | Admit: 2019-11-12 | Discharge: 2019-11-13 | Disposition: A | Discharge status: Home / Self Care | Payer: Medicare HMO | Attending: Internal Medicine | Admitting: Internal Medicine

## 2019-11-12 ENCOUNTER — Other Ambulatory Visit: Payer: Self-pay

## 2019-11-12 ENCOUNTER — Encounter (HOSPITAL_COMMUNITY): Payer: Self-pay

## 2019-11-12 DIAGNOSIS — W01198A Fall on same level from slipping, tripping and stumbling with subsequent striking against other object, initial encounter: Secondary | ICD-10-CM | POA: Diagnosis not present

## 2019-11-12 DIAGNOSIS — S065X0A Traumatic subdural hemorrhage without loss of consciousness, initial encounter: Secondary | ICD-10-CM | POA: Diagnosis not present

## 2019-11-12 DIAGNOSIS — Z8673 Personal history of transient ischemic attack (TIA), and cerebral infarction without residual deficits: Secondary | ICD-10-CM | POA: Insufficient documentation

## 2019-11-12 DIAGNOSIS — Z7982 Long term (current) use of aspirin: Secondary | ICD-10-CM | POA: Insufficient documentation

## 2019-11-12 DIAGNOSIS — Z87891 Personal history of nicotine dependence: Secondary | ICD-10-CM | POA: Diagnosis not present

## 2019-11-12 DIAGNOSIS — J9811 Atelectasis: Secondary | ICD-10-CM | POA: Diagnosis not present

## 2019-11-12 DIAGNOSIS — S01511A Laceration without foreign body of lip, initial encounter: Secondary | ICD-10-CM

## 2019-11-12 DIAGNOSIS — I8290 Acute embolism and thrombosis of unspecified vein: Secondary | ICD-10-CM | POA: Diagnosis present

## 2019-11-12 DIAGNOSIS — W19XXXA Unspecified fall, initial encounter: Secondary | ICD-10-CM

## 2019-11-12 DIAGNOSIS — Z20822 Contact with and (suspected) exposure to covid-19: Secondary | ICD-10-CM | POA: Diagnosis not present

## 2019-11-12 DIAGNOSIS — S0993XA Unspecified injury of face, initial encounter: Secondary | ICD-10-CM | POA: Diagnosis not present

## 2019-11-12 DIAGNOSIS — I5032 Chronic diastolic (congestive) heart failure: Secondary | ICD-10-CM

## 2019-11-12 DIAGNOSIS — S065X9A Traumatic subdural hemorrhage with loss of consciousness of unspecified duration, initial encounter: Secondary | ICD-10-CM | POA: Diagnosis not present

## 2019-11-12 DIAGNOSIS — I700 Atherosclerosis of aorta: Secondary | ICD-10-CM | POA: Diagnosis not present

## 2019-11-12 DIAGNOSIS — Z7901 Long term (current) use of anticoagulants: Secondary | ICD-10-CM | POA: Diagnosis not present

## 2019-11-12 DIAGNOSIS — J449 Chronic obstructive pulmonary disease, unspecified: Secondary | ICD-10-CM | POA: Insufficient documentation

## 2019-11-12 DIAGNOSIS — S0990XA Unspecified injury of head, initial encounter: Secondary | ICD-10-CM | POA: Diagnosis present

## 2019-11-12 DIAGNOSIS — Z79899 Other long term (current) drug therapy: Secondary | ICD-10-CM | POA: Insufficient documentation

## 2019-11-12 DIAGNOSIS — K219 Gastro-esophageal reflux disease without esophagitis: Secondary | ICD-10-CM | POA: Diagnosis not present

## 2019-11-12 DIAGNOSIS — I2510 Atherosclerotic heart disease of native coronary artery without angina pectoris: Secondary | ICD-10-CM | POA: Diagnosis present

## 2019-11-12 DIAGNOSIS — J90 Pleural effusion, not elsewhere classified: Secondary | ICD-10-CM | POA: Diagnosis not present

## 2019-11-12 DIAGNOSIS — R296 Repeated falls: Secondary | ICD-10-CM | POA: Diagnosis not present

## 2019-11-12 DIAGNOSIS — I214 Non-ST elevation (NSTEMI) myocardial infarction: Secondary | ICD-10-CM | POA: Diagnosis not present

## 2019-11-12 DIAGNOSIS — S199XXA Unspecified injury of neck, initial encounter: Secondary | ICD-10-CM | POA: Diagnosis not present

## 2019-11-12 DIAGNOSIS — S065XAA Traumatic subdural hemorrhage with loss of consciousness status unknown, initial encounter: Secondary | ICD-10-CM | POA: Diagnosis present

## 2019-11-12 DIAGNOSIS — E8809 Other disorders of plasma-protein metabolism, not elsewhere classified: Secondary | ICD-10-CM | POA: Diagnosis not present

## 2019-11-12 DIAGNOSIS — J9611 Chronic respiratory failure with hypoxia: Secondary | ICD-10-CM

## 2019-11-12 DIAGNOSIS — R58 Hemorrhage, not elsewhere classified: Secondary | ICD-10-CM | POA: Diagnosis not present

## 2019-11-12 DIAGNOSIS — R55 Syncope and collapse: Secondary | ICD-10-CM

## 2019-11-12 LAB — COMPREHENSIVE METABOLIC PANEL
ALT: 19 U/L (ref 0–44)
AST: 22 U/L (ref 15–41)
Albumin: 3 g/dL — ABNORMAL LOW (ref 3.5–5.0)
Alkaline Phosphatase: 59 U/L (ref 38–126)
Anion gap: 6 (ref 5–15)
BUN: 6 mg/dL — ABNORMAL LOW (ref 8–23)
CO2: 34 mmol/L — ABNORMAL HIGH (ref 22–32)
Calcium: 8.8 mg/dL — ABNORMAL LOW (ref 8.9–10.3)
Chloride: 98 mmol/L (ref 98–111)
Creatinine, Ser: 0.39 mg/dL — ABNORMAL LOW (ref 0.44–1.00)
GFR calc Af Amer: >60 mL/min (ref >60)
GFR calc non Af Amer: >60 mL/min (ref >60)
Glucose, Bld: 100 mg/dL — ABNORMAL HIGH (ref 70–99)
Potassium: 3.9 mmol/L (ref 3.5–5.1)
Sodium: 138 mmol/L (ref 135–145)
Total Bilirubin: 0.7 mg/dL (ref 0.3–1.2)
Total Protein: 6.3 g/dL — ABNORMAL LOW (ref 6.5–8.1)

## 2019-11-12 LAB — CBC WITH DIFFERENTIAL/PLATELET
Abs Immature Granulocytes: 0.03 10*3/uL (ref 0.00–0.07)
Basophils Absolute: 0 10*3/uL (ref 0.0–0.1)
Basophils Relative: 1 %
Eosinophils Absolute: 0 10*3/uL (ref 0.0–0.5)
Eosinophils Relative: 0 %
HCT: 33.1 % — ABNORMAL LOW (ref 36.0–46.0)
Hemoglobin: 9.9 g/dL — ABNORMAL LOW (ref 12.0–15.0)
Immature Granulocytes: 1 %
Lymphocytes Relative: 15 %
Lymphs Abs: 0.8 10*3/uL (ref 0.7–4.0)
MCH: 27.8 pg (ref 26.0–34.0)
MCHC: 29.9 g/dL — ABNORMAL LOW (ref 30.0–36.0)
MCV: 93 fL (ref 80.0–100.0)
Monocytes Absolute: 0.5 10*3/uL (ref 0.1–1.0)
Monocytes Relative: 9 %
Neutro Abs: 4 10*3/uL (ref 1.7–7.7)
Neutrophils Relative %: 74 %
Platelets: 196 10*3/uL (ref 150–400)
RBC: 3.56 MIL/uL — ABNORMAL LOW (ref 3.87–5.11)
RDW: 15.4 % (ref 11.5–15.5)
WBC: 5.2 10*3/uL (ref 4.0–10.5)
nRBC: 0 % (ref 0.0–0.2)

## 2019-11-12 LAB — RESP PANEL BY RT PCR (RSV, FLU A&B, COVID)
Influenza A by PCR: NEGATIVE
Influenza B by PCR: NEGATIVE
Respiratory Syncytial Virus by PCR: NEGATIVE
SARS Coronavirus 2 by RT PCR: NEGATIVE

## 2019-11-12 LAB — LIPASE, BLOOD: Lipase: 23 U/L (ref 11–51)

## 2019-11-12 LAB — BRAIN NATRIURETIC PEPTIDE: B Natriuretic Peptide: 337 pg/mL — ABNORMAL HIGH (ref 0.0–100.0)

## 2019-11-12 LAB — CK: Total CK: 141 U/L (ref 38–234)

## 2019-11-12 LAB — LACTIC ACID, PLASMA: Lactic Acid, Venous: 0.9 mmol/L (ref 0.5–1.9)

## 2019-11-12 MED ORDER — GUAIFENESIN ER 600 MG PO TB12: 600.0000 mg | ORAL_TABLET | Freq: Two times a day (BID) | ORAL | Status: DC | PRN

## 2019-11-12 MED ORDER — ACETAMINOPHEN 650 MG RE SUPP: 650.0000 mg | Freq: Four times a day (QID) | RECTAL | Status: DC | PRN

## 2019-11-12 MED ORDER — PANTOPRAZOLE SODIUM 40 MG PO TBEC
40.0000 mg | DELAYED_RELEASE_TABLET | Freq: Every day | ORAL | Status: DC
  Administered 2019-11-13: 40 mg via ORAL
  Filled 2019-11-12: qty 1

## 2019-11-12 MED ORDER — ATORVASTATIN CALCIUM 40 MG PO TABS
40.0000 mg | ORAL_TABLET | Freq: Every day | ORAL | Status: DC
  Administered 2019-11-13: 40 mg via ORAL
  Filled 2019-11-12: qty 1

## 2019-11-12 MED ORDER — ACETAMINOPHEN 325 MG PO TABS: 650.0000 mg | ORAL_TABLET | Freq: Four times a day (QID) | ORAL | Status: DC | PRN

## 2019-11-12 MED ORDER — LIDOCAINE HCL (PF) 1 % IJ SOLN
5.0000 mL | Freq: Once | INTRAMUSCULAR | Status: AC
  Administered 2019-11-12: 5 mL via INTRADERMAL
  Filled 2019-11-12: qty 30

## 2019-11-12 MED ORDER — IPRATROPIUM-ALBUTEROL 0.5-2.5 (3) MG/3ML IN SOLN
3.0000 mL | Freq: Four times a day (QID) | RESPIRATORY_TRACT | Status: DC
  Administered 2019-11-12 – 2019-11-13 (×2): 3 mL via RESPIRATORY_TRACT
  Filled 2019-11-12 (×2): qty 3

## 2019-11-12 MED ORDER — ENSURE ENLIVE PO LIQD
237.0000 mL | Freq: Two times a day (BID) | ORAL | Status: DC
  Administered 2019-11-13: 237 mL via ORAL
  Filled 2019-11-12 (×4): qty 237

## 2019-11-12 MED ORDER — ALBUTEROL SULFATE (2.5 MG/3ML) 0.083% IN NEBU: 2.5000 mg | INHALATION_SOLUTION | RESPIRATORY_TRACT | Status: DC | PRN

## 2019-11-12 NOTE — ED Notes (Signed)
Patient refusing any further care, refusing IV start, blood draw, lip laceration repair. MD at bedside trying to speak with patient.

## 2019-11-12 NOTE — ED Notes (Signed)
Patient to CT and x-ray at this time.

## 2019-11-12 NOTE — ED Provider Notes (Signed)
[facility] EMERGENCY DEPARTMENT Provider Note   CSN: [id number] Arrival date & time: 11/12/19  1713     History Chief Complaint  Patient presents with   Emily Rocha is a 64 y.o. female.  HPI    Patient presents via EMS after 2 falls over the past days. Patient herself is awake and alert, provides her own history, additional details are obtained by EMS via nursing staff. Patient acknowledges multiple medical issues including home oxygen use. She notes that today, she stumbled over her oxygen tank, falling and striking her face. She was possibly on the ground for as long as 4 hours. She denies syncope, weakness, however. She does have no pain currently, previously had some about her face, where she notes that she broke her dentures and sustained a laceration to her lower lip. No recent medication change, diet change, activity change.  Per EMS the patient had 99% saturation on 3 L via nasal cannula, her typical amount.  Past Medical History:  Diagnosis Date   Anxiety    Asthma    COPD (chronic obstructive pulmonary disease) ([place])    Coronary artery disease    hx CABG   GERD (gastroesophageal reflux disease)    Peripheral vascular disease (HCC)    Pneumonia    Shortness of breath dyspnea    Stroke [facility])    "mini stroke" numbness and weakness to left side of the body    Patient Active Problem List   Diagnosis Date Noted   Shoulder pain 11/05/2019   Aspiration pneumonia ([place]) 11/02/2019   Hiatal hernia 11/02/2019   Thrombus 11/02/2019   Hypokalemia 08/15/1599   Acute metabolic encephalopathy    Acute respiratory failure with hypoxia ([place]) 10/24/2019   Rt Sided Pneumonia with loculated parapneumonic effusion 10/24/2019   Rt Sided ParaPneumonic Pleural effusion 10/24/2019   History of lung cancer- Non-Small cell/S/p Prior VATs 10/24/2019   CAD (coronary artery disease)/Prior CABG 10/24/2019   Acute respiratory failure ([place]) 10/24/2019     Malignant neoplasm of right upper lobe of lung ([place]) 11/27/2018   S/P lobectomy of lung 10/26/2018   Nodule of upper lobe of right lung 09/13/2018   Wound of sternal region 01/16/2018   DVT (deep venous thrombosis) ([place]) 01/16/2018   TIA due to embolism ([place]) 09/32/3557   Acute diastolic heart failure ([place])    Unstable angina ([place]) 12/14/2016   S/P CABG x 3 12/14/2016   Chest pain 12/12/2016   NSTEMI (non-ST elevated myocardial infarction) ([place]) 12/12/2016   COPD exacerbation ([place]) 12/12/2016   GERD (gastroesophageal reflux disease) 12/12/2016   CAP (community acquired pneumonia) 12/12/2016   Abdominal pain, epigastric 11/30/2016   Absolute anemia 11/30/2016   Carotid stenosis 12/19/2014   Aftercare following surgery of the circulatory system 12/19/2014   Carotid artery stenosis, symptomatic 12/06/2014   Symptomatic carotid artery stenosis 12/04/2014    Past Surgical History:  Procedure Laterality Date   AORTIC ARCH ANGIOGRAPHY N/A 11/04/2017   Procedure: AORTIC ARCH ANGIOGRAPHY;  Surgeon: Elam Dutch, MD;  Location: [place] CV LAB;  Service: Cardiovascular;  Laterality: N/A;   APPLICATION OF WOUND VAC N/A 01/06/2018   Procedure: APPLICATION OF WOUND VAC;  Surgeon: Ivin Poot, MD;  Location: [place];  Service: Thoracic;  Laterality: N/A;   APPLICATION OF WOUND VAC N/A 01/13/2018   Procedure: WOUND VAC CHANGE;  Surgeon: Ivin Poot, MD;  Location: [place];  Service: Thoracic;  Laterality: N/A;   CARDIAC CATHETERIZATION  CAROTID-SUBCLAVIAN BYPASS GRAFT Right 12/26/2017   Procedure: AORTIC TO RIGHT COMMON CAROTID AND RIGHT SUBCLAVIAN  ARTERY  BYPASS;  Surgeon: Elam Dutch, MD;  Location: [place];  Service: Vascular;  Laterality: Right;   CHOLECYSTECTOMY     CLOSURE OF DIAPHRAGM  12/26/2017   Procedure: REPAIR OF DIAPHRAGM;  Surgeon: Ivin Poot, MD;  Location: [place];  Service: Thoracic;;   COLONOSCOPY N/A 10/03/2014    Procedure: COLONOSCOPY;  Surgeon: Rogene Houston, MD;  [facility];  Service: Endoscopy;  Laterality: N/A;  730   CORONARY ARTERY BYPASS GRAFT N/A 12/14/2016   Procedure: CORONARY ARTERY BYPASS GRAFTING (CABG) x three , using left internal mammary artery and right leg greater saphenous vein harvested endoscopically;  Surgeon: Ivin Poot, MD;  Location: [place];  Service: Open Heart Surgery;  Laterality: N/A;   ENDARTERECTOMY Right 12/06/2014   Procedure: ENDARTERECTOMY CAROTid;  Surgeon: Elam Dutch, MD;  Location[facility];  Service: Vascular;  Laterality: Right;   ESOPHAGOGASTRODUODENOSCOPY N/A 12/03/2016   Procedure: ESOPHAGOGASTRODUODENOSCOPY (EGD);  Surgeon: Rogene Houston, MD;  [facility];  Service: Endoscopy;  Laterality: N/A;  7:30   LEFT HEART CATH AND CORONARY ANGIOGRAPHY N/A 12/14/2016   Procedure: LEFT HEART CATH AND CORONARY ANGIOGRAPHY;  Surgeon: Burnell Blanks, MD;  Location: [place] CV LAB;  Service: Cardiovascular;  Laterality: N/A;   LOBECTOMY Right 10/26/2018   Procedure: RIGHT UPPER LOBECTOMY;  Surgeon: Lajuana Matte, MD;  Location: [place];  Service: Thoracic;  Laterality: Right;   PERIPHERAL VASCULAR CATHETERIZATION N/A 12/05/2014   Procedure:  Carotid Arch Angiography;  Surgeon: Conrad Toccoa, MD;  Location: [place] [facility] LAB;  Service: Cardiovascular;  Laterality: N/A;   STERNAL WOUND DEBRIDEMENT N/A 01/06/2018   Procedure: STERNAL WOUND DEBRIDEMENT;  Surgeon: Ivin Poot, MD;  Location: [place];  Service: Thoracic;  Laterality: N/A;   STERNOTOMY N/A 12/26/2017   Procedure: REDO STERNOTOMY;  Surgeon: Prescott Gum, Collier Salina, MD;  Location: [place];  Service: Thoracic;  Laterality: N/A;   TEE WITHOUT CARDIOVERSION N/A 12/14/2016   Procedure: TRANSESOPHAGEAL ECHOCARDIOGRAM (TEE);  Surgeon: Prescott Gum, Collier Salina, MD;  Location: [place];  Service: Open Heart Surgery;  Laterality: N/A;   VIDEO ASSISTED THORACOSCOPY (VATS)/WEDGE  RESECTION Right 10/26/2018   Procedure: VIDEO ASSISTED THORACOSCOPY (VATS)/WEDGE RESECTION;  Surgeon: Lajuana Matte, MD;  Location: [place];  Service: Thoracic;  Laterality: Right;   VIDEO BRONCHOSCOPY N/A 10/26/2018   Procedure: VIDEO BRONCHOSCOPY;  Surgeon: Lajuana Matte, MD;  Location: [place];  Service: Thoracic;  Laterality: N/A;     OB History   No obstetric history on file.     Family History  Problem Relation Age of Onset   Heart attack Mother    Cancer Mother    Diabetes Mother    Heart disease Mother    Heart attack Father    Heart disease Father     Social History   Tobacco Use   Smoking status: Former Smoker    Packs/day: 2.00    Years: 40.00    Pack years: 80.00    Quit date: 10/24/2015    Years since quitting: 4.0   Smokeless tobacco: Never Used  Vaping Use   Vaping Use: Former   Start date: 10/23/2013   Quit date: 10/24/2015  Substance Use Topics   Alcohol use: No    Alcohol/week: 0.0 standard drinks   Drug use: No    Home Medications Prior to Admission  medications   Medication Sig Start Date End Date Taking? Authorizing Provider  albuterol (PROVENTIL) (2.5 MG/3ML) 0.083% nebulizer solution Take 2.5 mg by nebulization every 4 (four) hours as needed. 03/30/19   [provider]  ALPRAZolam Duanne Moron) 0.5 MG tablet Take 0.5 mg by mouth 3 (three) times daily.     [provider]  APIXABAN [name]) VTE STARTER PACK (10MG  AND 5MG ) Take as directed on package: start with two-5mg  tablets twice daily for 7 days. On day 8, switch to one-5mg  tablet twice daily. 11/06/19   Desiree Hane, MD  Ascorbic Acid (VITAMIN C GUMMIE) 120 MG CHEW Chew 500 mg by mouth.    [provider]  aspirin 81 MG chewable tablet Chew 81 mg by mouth daily.    [provider]  atorvastatin (LIPITOR) 40 MG tablet Take 1 tablet (40 mg total) daily by mouth. 12/27/16   Gold, Wilder Glade, PA-C  calcium carbonate (OS-CAL) 1250 (500 Ca) MG chewable  tablet Chew 1 tablet by mouth daily.    [provider]  cholecalciferol (VITAMIN D3) 25 MCG (1000 UNIT) tablet Take 1,000 Units by mouth daily.    [provider]  diphenhydramine-acetaminophen (TYLENOL PM) 25-500 MG TABS tablet Take 2-3 tablets by mouth at bedtime.    [provider]  guaiFENesin (MUCINEX) 600 MG 12 hr tablet Take 1 tablet (600 mg total) by mouth 2 (two) times daily as needed. 11/06/19   Desiree Hane, MD  isosorbide mononitrate (IMDUR) 30 MG 24 hr tablet Take 0.5 tablets (15 mg total) by mouth daily. 03/21/19   Arnoldo Lenis, MD  metoprolol succinate (TOPROL-XL) 50 MG 24 hr tablet Take 50 mg by mouth daily. Take with or immediately following a meal.    [provider]  Multiple Vitamin (MULTIVITAMIN WITH MINERALS) TABS tablet Take 1 tablet by mouth daily. 12/01/18   Nani Skillern, PA-C  pantoprazole (PROTONIX) 40 MG tablet Take 40 mg by mouth daily.    [provider]  potassium chloride SA (K-DUR,KLOR-CON) 20 MEQ tablet Take 20 mEq by mouth daily.  10/20/17   [provider]  QUEtiapine (SEROQUEL) 25 MG tablet Take 1 tablet (25 mg total) by mouth at bedtime. 11/06/19   Desiree Hane, MD  Tiotropium Bromide-Olodaterol (STIOLTO RESPIMAT) 2.5-2.5 MCG/ACT AERS Inhale 2 puffs into the lungs daily. 11/06/19   Desiree Hane, MD  torsemide (DEMADEX) 20 MG tablet Take 1 tablet (20 mg total) by mouth daily. 11/01/18   Nani Skillern, PA-C    Allergies    Patient has no known allergies.  Review of Systems   Review of Systems  Constitutional:       Per HPI, otherwise negative  HENT:       Per HPI, otherwise negative  Respiratory:       Per HPI, otherwise negative  Cardiovascular:       Per HPI, otherwise negative  Gastrointestinal: Negative for vomiting.  Endocrine:       Negative aside from HPI  Genitourinary:       Neg aside from HPI   Musculoskeletal:       Per HPI, otherwise negative  Skin:  Positive for color change and wound.  Neurological: Negative for syncope.    Physical Exam Updated Vital Signs Ht 5\' 5"  (1.651 m)    Wt 81 kg    BMI 29.72 kg/m   Physical Exam Vitals and nursing note reviewed.  Constitutional:      General: She  is not in acute distress.    Appearance: She is well-developed.  HENT:     Head: Normocephalic.      Mouth/Throat:   Eyes:     Conjunctiva/sclera: Conjunctivae normal.  Cardiovascular:     Rate and Rhythm: Normal rate and regular rhythm.  Pulmonary:     Effort: Pulmonary effort is normal. No respiratory distress.     Breath sounds: Normal breath sounds. No stridor.  Abdominal:     General: There is no distension.  Musculoskeletal:     Cervical back: No spinous process tenderness or muscular tenderness.  Skin:    General: Skin is warm and dry.  Neurological:     Mental Status: She is alert and oriented to person, place, and time.     Cranial Nerves: No cranial nerve deficit.      ED Results / Procedures / Treatments   Labs (all labs ordered are listed, but only abnormal results are displayed) Labs Reviewed  COMPREHENSIVE METABOLIC PANEL  LIPASE, BLOOD  BRAIN NATRIURETIC PEPTIDE  CBC WITH DIFFERENTIAL/PLATELET  LACTIC ACID, PLASMA  LACTIC ACID, PLASMA  URINALYSIS, ROUTINE W REFLEX MICROSCOPIC  CK    EKG None  Radiology DG Chest 2 View  Result Date: 11/12/2019 CLINICAL DATA:  Status post fall. EXAM: CHEST - 2 VIEW COMPARISON:  November 02, 2019 FINDINGS: Multiple sternal wires are seen. Mild, diffuse, chronic appearing increased interstitial lung markings are noted. Radiopaque surgical clips are seen along the medial aspect of the right apex. Ill-defined surgical sutures are seen overlying the right lung base. Mild right basilar atelectasis is also present. There is very mild blunting of the right costophrenic angle. No pneumothorax is seen. The heart size and mediastinal contours are within normal limits. There is mild  calcification of the aortic arch. Surgical clips are seen overlying the right upper quadrant. The visualized skeletal structures are unremarkable. IMPRESSION: 1. Evidence of prior median sternotomy/CABG. 2. Postoperative changes within the right lung base with mild right basilar atelectasis. 3. Very small right pleural effusion. Electronically Signed   By: Virgina Norfolk M.D.   On: 11/12/2019 18:08   CT Head Wo Contrast  Result Date: 11/12/2019 CLINICAL DATA:  Poly trauma, critical, head/cervical spine injury suspected. Facial trauma. Additional history provided: Fall, laceration to bottom lip, multiple bruises, patient also reports falling yesterday. EXAM: CT HEAD WITHOUT CONTRAST CT MAXILLOFACIAL WITHOUT CONTRAST CT CERVICAL SPINE WITHOUT CONTRAST TECHNIQUE: Multidetector CT imaging of the head, cervical spine, and maxillofacial structures were performed using the standard protocol without intravenous contrast. Multiplanar CT image reconstructions of the cervical spine and maxillofacial structures were also generated. COMPARISON:  Head CT 10/25/2019. FINDINGS: CT HEAD FINDINGS Brain: Stable, mild generalized cerebral atrophy. Stable, mild patchy hypoattenuation within the cerebral white matter which is nonspecific, but consistent with chronic small vessel ischemic disease. There is acute parafalcine subdural hemorrhage measuring up to 4 mm (for instance as seen on series 2, image 26) (series 4, image 35). Trace acute subdural hemorrhage is also questioned along the left tentorium (series 2, images 11 and 12). No demarcated cortical infarct is identified. No evidence of intracranial mass. No midline shift. Vascular: No hyperdense vessel. Skull: Normal. Negative for fracture or focal lesion. Other: Bilateral mastoid effusions. CT MAXILLOFACIAL FINDINGS Osseous: Motion degradation limits evaluation of the mandibular body. No acute maxillofacial fracture is identified. Orbits: No acute finding. The globes are  normal in size and contour. The extraocular muscles and optic nerve sheath complexes are symmetric and unremarkable. Sinuses: Mild  ethmoid and right sphenoid sinus mucosal thickening. Soft tissues: Midline forehead and right maxillofacial soft tissue swelling/hematoma. Incidentally noted anterior nasal septal defect (series 8, image 19). CT CERVICAL SPINE FINDINGS Alignment: A mild cervical levocurvature may be positional. Straightening of the expected cervical lordosis. Trace C5-C6 grade 1 retrolisthesis Skull base and vertebrae: The basion-dental and atlanto-dental intervals are maintained.No evidence of acute fracture to the cervical spine. Soft tissues and spinal canal: No prevertebral fluid or swelling. No visible canal hematoma. Disc levels: Cervical spondylosis with multilevel disc space narrowing, posterior disc osteophytes, uncovertebral and facet hypertrophy. These findings are greatest at C5-C6, C6-C7 and C7-T1. Upper chest: No consolidation within the imaged lung apices. No visible pneumothorax. These results were called by telephone at the time of interpretation on 11/12/2019 at 6:16 pm to provider Carmin Muskrat , who verbally acknowledged these results. IMPRESSION: CT head: 1. Acute parafalcine subdural hemorrhage measuring up to 4 mm. Trace acute subdural hemorrhage is also questioned along the left tentorium. 2. Stable mild generalized cerebral atrophy and chronic small vessel ischemic disease. 3. Bilateral mastoid effusions. CT maxillofacial 1. Evaluation of the mandibular body is limited due to motion degradation. 2. No acute maxillofacial fracture is identified. 3. Midline forehead and right maxillofacial soft tissue swelling/hematoma. 4. Mild ethmoid and right sphenoid sinus mucosal thickening. 5. Incidentally noted anterior nasal septal defect. CT cervical spine: 1. No evidence of acute fracture to the cervical spine. 2. Trace C5-C6 grade 1 retrolisthesis. 3. Cervical spondylosis as described  and greatest at C5-C6, C6-C7 and C7-T1. Electronically Signed   By: Kellie Simmering DO   On: 11/12/2019 18:17   CT Cervical Spine Wo Contrast  Result Date: 11/12/2019 CLINICAL DATA:  Poly trauma, critical, head/cervical spine injury suspected. Facial trauma. Additional history provided: Fall, laceration to bottom lip, multiple bruises, patient also reports falling yesterday. EXAM: CT HEAD WITHOUT CONTRAST CT MAXILLOFACIAL WITHOUT CONTRAST CT CERVICAL SPINE WITHOUT CONTRAST TECHNIQUE: Multidetector CT imaging of the head, cervical spine, and maxillofacial structures were performed using the standard protocol without intravenous contrast. Multiplanar CT image reconstructions of the cervical spine and maxillofacial structures were also generated. COMPARISON:  Head CT 10/25/2019. FINDINGS: CT HEAD FINDINGS Brain: Stable, mild generalized cerebral atrophy. Stable, mild patchy hypoattenuation within the cerebral white matter which is nonspecific, but consistent with chronic small vessel ischemic disease. There is acute parafalcine subdural hemorrhage measuring up to 4 mm (for instance as seen on series 2, image 26) (series 4, image 35). Trace acute subdural hemorrhage is also questioned along the left tentorium (series 2, images 11 and 12). No demarcated cortical infarct is identified. No evidence of intracranial mass. No midline shift. Vascular: No hyperdense vessel. Skull: Normal. Negative for fracture or focal lesion. Other: Bilateral mastoid effusions. CT MAXILLOFACIAL FINDINGS Osseous: Motion degradation limits evaluation of the mandibular body. No acute maxillofacial fracture is identified. Orbits: No acute finding. The globes are normal in size and contour. The extraocular muscles and optic nerve sheath complexes are symmetric and unremarkable. Sinuses: Mild ethmoid and right sphenoid sinus mucosal thickening. Soft tissues: Midline forehead and right maxillofacial soft tissue swelling/hematoma. Incidentally noted  anterior nasal septal defect (series 8, image 19). CT CERVICAL SPINE FINDINGS Alignment: A mild cervical levocurvature may be positional. Straightening of the expected cervical lordosis. Trace C5-C6 grade 1 retrolisthesis Skull base and vertebrae: The basion-dental and atlanto-dental intervals are maintained.No evidence of acute fracture to the cervical spine. Soft tissues and spinal canal: No prevertebral fluid or swelling. No visible canal hematoma. Disc  levels: Cervical spondylosis with multilevel disc space narrowing, posterior disc osteophytes, uncovertebral and facet hypertrophy. These findings are greatest at C5-C6, C6-C7 and C7-T1. Upper chest: No consolidation within the imaged lung apices. No visible pneumothorax. These results were called by telephone at the time of interpretation on 11/12/2019 at 6:16 pm to provider Carmin Muskrat , who verbally acknowledged these results. IMPRESSION: CT head: 1. Acute parafalcine subdural hemorrhage measuring up to 4 mm. Trace acute subdural hemorrhage is also questioned along the left tentorium. 2. Stable mild generalized cerebral atrophy and chronic small vessel ischemic disease. 3. Bilateral mastoid effusions. CT maxillofacial 1. Evaluation of the mandibular body is limited due to motion degradation. 2. No acute maxillofacial fracture is identified. 3. Midline forehead and right maxillofacial soft tissue swelling/hematoma. 4. Mild ethmoid and right sphenoid sinus mucosal thickening. 5. Incidentally noted anterior nasal septal defect. CT cervical spine: 1. No evidence of acute fracture to the cervical spine. 2. Trace C5-C6 grade 1 retrolisthesis. 3. Cervical spondylosis as described and greatest at C5-C6, C6-C7 and C7-T1. Electronically Signed   By: Kellie Simmering DO   On: 11/12/2019 18:17   CT Maxillofacial WO CM  Result Date: 11/12/2019 CLINICAL DATA:  Poly trauma, critical, head/cervical spine injury suspected. Facial trauma. Additional history provided: Fall,  laceration to bottom lip, multiple bruises, patient also reports falling yesterday. EXAM: CT HEAD WITHOUT CONTRAST CT MAXILLOFACIAL WITHOUT CONTRAST CT CERVICAL SPINE WITHOUT CONTRAST TECHNIQUE: Multidetector CT imaging of the head, cervical spine, and maxillofacial structures were performed using the standard protocol without intravenous contrast. Multiplanar CT image reconstructions of the cervical spine and maxillofacial structures were also generated. COMPARISON:  Head CT 10/25/2019. FINDINGS: CT HEAD FINDINGS Brain: Stable, mild generalized cerebral atrophy. Stable, mild patchy hypoattenuation within the cerebral white matter which is nonspecific, but consistent with chronic small vessel ischemic disease. There is acute parafalcine subdural hemorrhage measuring up to 4 mm (for instance as seen on series 2, image 26) (series 4, image 35). Trace acute subdural hemorrhage is also questioned along the left tentorium (series 2, images 11 and 12). No demarcated cortical infarct is identified. No evidence of intracranial mass. No midline shift. Vascular: No hyperdense vessel. Skull: Normal. Negative for fracture or focal lesion. Other: Bilateral mastoid effusions. CT MAXILLOFACIAL FINDINGS Osseous: Motion degradation limits evaluation of the mandibular body. No acute maxillofacial fracture is identified. Orbits: No acute finding. The globes are normal in size and contour. The extraocular muscles and optic nerve sheath complexes are symmetric and unremarkable. Sinuses: Mild ethmoid and right sphenoid sinus mucosal thickening. Soft tissues: Midline forehead and right maxillofacial soft tissue swelling/hematoma. Incidentally noted anterior nasal septal defect (series 8, image 19). CT CERVICAL SPINE FINDINGS Alignment: A mild cervical levocurvature may be positional. Straightening of the expected cervical lordosis. Trace C5-C6 grade 1 retrolisthesis Skull base and vertebrae: The basion-dental and atlanto-dental intervals  are maintained.No evidence of acute fracture to the cervical spine. Soft tissues and spinal canal: No prevertebral fluid or swelling. No visible canal hematoma. Disc levels: Cervical spondylosis with multilevel disc space narrowing, posterior disc osteophytes, uncovertebral and facet hypertrophy. These findings are greatest at C5-C6, C6-C7 and C7-T1. Upper chest: No consolidation within the imaged lung apices. No visible pneumothorax. These results were called by telephone at the time of interpretation on 11/12/2019 at 6:16 pm to provider Carmin Muskrat , who verbally acknowledged these results. IMPRESSION: CT head: 1. Acute parafalcine subdural hemorrhage measuring up to 4 mm. Trace acute subdural hemorrhage is also questioned along the left  tentorium. 2. Stable mild generalized cerebral atrophy and chronic small vessel ischemic disease. 3. Bilateral mastoid effusions. CT maxillofacial 1. Evaluation of the mandibular body is limited due to motion degradation. 2. No acute maxillofacial fracture is identified. 3. Midline forehead and right maxillofacial soft tissue swelling/hematoma. 4. Mild ethmoid and right sphenoid sinus mucosal thickening. 5. Incidentally noted anterior nasal septal defect. CT cervical spine: 1. No evidence of acute fracture to the cervical spine. 2. Trace C5-C6 grade 1 retrolisthesis. 3. Cervical spondylosis as described and greatest at C5-C6, C6-C7 and C7-T1. Electronically Signed   By: Kellie Simmering DO   On: 11/12/2019 18:17    Procedures Procedures (including critical care time)  LACERATION REPAIR Performed by: Carmin Muskrat Authorized by: Carmin Muskrat Consent: Verbal consent obtained. Risks and benefits: risks, benefits and alternatives were discussed Consent given by: patient Patient identity confirmed: provided demographic data Prepped and Draped in normal sterile fashion Wound explored  Laceration Location: lip, lower through [place] border - aged in  appearance  Laceration Length: 4cm  No Foreign Bodies seen or palpated  Anesthesia: local infiltration  Local anesthetic: lidocaine 1% no epinephrine  Anesthetic total: 2 ml  Irrigation method: syringe Amount of cleaning: standard  Skin closure: 5-0 absorbable  Number of sutures: 3  Technique: close  Patient tolerance: Patient tolerated the procedure well with no immediate complications.   Medications Ordered in ED Medications  lidocaine (PF) (XYLOCAINE) 1 % injection 5 mL (has no administration in time range)    ED Course  I have reviewed the triage vital signs and the nursing notes.  Pertinent labs & imaging results that were available during my care of the patient were reviewed by me and considered in my medical decision making (see chart for details).    6:31 PM I discussed the patient's case with the radiologist. Patient found to have acute subdural hemorrhage. On repeat exam the patient is awake and alert, confirms that she is taking Eliquis, but requests discharge. I stressed the importance of evaluation for her posttraumatic subdural hemorrhage, and was able to locate her husband, who is now at bedside, lobbying for the patient stay for further evaluation, monitoring, management. Patient not amenable to suture repair of her lip wound.  Husband does note that at baseline the patient has appropriate capacity, decision-making ability.  8:09 PM I discussed the patient's case with our neurosurgery colleagues.  No indication for reversal, and the patient does not require transfer to our affiliated center, but does require admission given presence of subdural hemorrhage following mechanical fall, on Eliquis. Patient herself is much more agreeable, now having spoken with her husband. Patient has had successful suture repair of her lip laceration, though it is noted that this wound was aged in appearance, likely occurred at least earlier in the day, possibly yesterday,  with substantial separation of the lip itself.   Final Clinical Impression(s) / ED Diagnoses Final diagnoses:  Fall, initial encounter  SDH (subdural hematoma) (HCC)  Lip laceration, initial encounter   MDM Number of Diagnoses or Management Options Fall, initial encounter: new, needed workup Lip laceration, initial encounter: new, needed workup SDH (subdural hematoma) ([place]): new, needed workup   Amount and/or Complexity of Data Reviewed Clinical lab tests: reviewed Tests in the radiology section of CPT: reviewed Tests in the medicine section of CPT: reviewed Discussion of test results with the performing providers: yes Decide to obtain previous medical records or to obtain history from someone other than the patient: yes Obtain history from  someone other than the patient: yes Review and summarize past medical records: yes Discuss the patient with other providers: yes Independent visualization of images, tracings, or specimens: yes  Risk of Complications, Morbidity, and/or Mortality Presenting problems: high Diagnostic procedures: high Management options: high  Critical Care Total time providing critical care: 30-74 minutes (35)  Patient Progress Patient progress: stable    Carmin Muskrat, MD 11/12/19 2013

## 2019-11-12 NOTE — H&P (Signed)
History and Physical  Emily Rocha WYO:[id number] DOB: 10/30/1955 DOA: 11/12/2019  Referring physician: Carmin Muskrat, MD PCP: Glenda Chroman, MD  Patient coming from: Home  Chief Complaint: Fall at home  HPI: Emily Rocha is a 64 y.o. female with medical history significant for COPD on 3LPM of O2 via [place] (24/7), coronary artery disease s/p CABG 11/2016), adenocarcinoma of the right lung requiring VATS and right upper lobe lobectomy (10/2018) and hiatal hernia who presents to the emergency department after sustaining recurrent falls within last 2 days.  Patient states that she sustained a fall 2 days ago (9/25) and had another fall last night (9/26).  She states that she stumbled over her oxygen tank today striking her face and was on the ground for approximately 4 hours, patient denies loss of consciousness, weakness, seizures.  EMS was activated and patient was noted to have sustained a lower lip laceration.  Patient denies any change in medication or diet, she denies headache, facial pain, nausea, vomiting, chest pain or abdominal pain.  ED Course:  In the emergency department, she was tachypneic, BP was 109/55 and O2 sat was 100% on 3 LPM of oxygen via [place].  Work-up in the ED showed normocytic anemia and hypoalbuminemia, BNP 337, lactic acid 0.9, total CK 141, lipase 23.  SARS coronavirus 2 was negative.  Chest x-ray showed postoperative changes within the right lung base with mild right basilar atelectasis and very small right pleural effusion.  CT head without contrast showed acute parafalcine subdural hemorrhage measuring up to 4 mm with questionable trace acute subdural hemorrhage along the left tentorium. CT maxillofacial and CT cervical spine without contrast showed no acute maxillofacial fracture or acute fracture to the cervical spine.  Neurosurgery team was consulted by ED physician and recommended admitting patient with a repeat CT of head in the morning with plan to discharge patient if  scan is stable and for patient to follow-up in their office.  Intradermal lidocaine was given.  Hospitalist was asked to admit patient for further evaluation management.  Review of Systems: Constitutional: Negative for chills and fever.  HENT: Positive for lip laceration from the fall.  Positive for bruise around the face.  Negative for ear pain and sore throat.   Eyes: Negative for pain and visual disturbance.  Respiratory: Positive for cough with occasional yellow sputum, negative for chest tightness and shortness of breath.   Cardiovascular: Negative for chest pain and palpitations.  Gastrointestinal: Negative for abdominal pain and vomiting.  Endocrine: Negative for polyphagia and polyuria.  Genitourinary: Negative for decreased urine volume, dysuria, enuresis Musculoskeletal: Negative for arthralgias and back pain.  Skin: Positive for  bruise around left eye.    Allergic/Immunologic: Negative for immunocompromised state.  Neurological: Negative for tremors, syncope, speech difficulty Hematological: Does not bruise/bleed easily.  All other systems reviewed and are negative   Past Medical History:  Diagnosis Date  . Anxiety   . Asthma   . COPD (chronic obstructive pulmonary disease) ([place])   . Coronary artery disease    hx CABG  . GERD (gastroesophageal reflux disease)   . Peripheral vascular disease ([place])   . Pneumonia   . Shortness of breath dyspnea   . Stroke [facility])    "mini stroke" numbness and weakness to left side of the body   Past Surgical History:  Procedure Laterality Date  . AORTIC ARCH ANGIOGRAPHY N/A 11/04/2017   Procedure: AORTIC ARCH ANGIOGRAPHY;  Surgeon: Elam Dutch, MD;  Location: [place]  [facility];  Service: Cardiovascular;  Laterality: N/A;  . APPLICATION OF WOUND VAC N/A 01/06/2018   Procedure: APPLICATION OF WOUND VAC;  Surgeon: Ivin Poot, MD;  Location: [place];  Service: Thoracic;  Laterality: N/A;  . APPLICATION OF WOUND VAC N/A 01/13/2018     Procedure: WOUND VAC CHANGE;  Surgeon: Ivin Poot, MD;  Location: [place];  Service: Thoracic;  Laterality: N/A;  . CARDIAC CATHETERIZATION    . CAROTID-SUBCLAVIAN BYPASS GRAFT Right 12/26/2017   Procedure: AORTIC TO RIGHT COMMON CAROTID AND RIGHT SUBCLAVIAN  ARTERY  BYPASS;  Surgeon: Elam Dutch, MD;  Location: [place];  Service: Vascular;  Laterality: Right;  . CHOLECYSTECTOMY    . CLOSURE OF DIAPHRAGM  12/26/2017   Procedure: REPAIR OF DIAPHRAGM;  Surgeon: Ivin Poot, MD;  Location: [place];  Service: Thoracic;;  . COLONOSCOPY N/A 10/03/2014   Procedure: COLONOSCOPY;  Surgeon: Rogene Houston, MD;  [facility];  Service: Endoscopy;  Laterality: N/A;  730  . CORONARY ARTERY BYPASS GRAFT N/A 12/14/2016   Procedure: CORONARY ARTERY BYPASS GRAFTING (CABG) x three , using left internal mammary artery and right leg greater saphenous vein harvested endoscopically;  Surgeon: Ivin Poot, MD;  Location: [place];  Service: Open Heart Surgery;  Laterality: N/A;  . ENDARTERECTOMY Right 12/06/2014   Procedure: ENDARTERECTOMY CAROTid;  Surgeon: Elam Dutch, MD;  Location: [facility];  Service: Vascular;  Laterality: Right;  . ESOPHAGOGASTRODUODENOSCOPY N/A 12/03/2016   Procedure: ESOPHAGOGASTRODUODENOSCOPY (EGD);  Surgeon: Rogene Houston, MD;  [facility];  Service: Endoscopy;  Laterality: N/A;  7:30  . LEFT HEART CATH AND CORONARY ANGIOGRAPHY N/A 12/14/2016   Procedure: LEFT HEART CATH AND CORONARY ANGIOGRAPHY;  Surgeon: Burnell Blanks, MD;  Location: [place] [facility];  Service: Cardiovascular;  Laterality: N/A;  . LOBECTOMY Right 10/26/2018   Procedure: RIGHT UPPER LOBECTOMY;  Surgeon: Lajuana Matte, MD;  Location: [place];  Service: Thoracic;  Laterality: Right;  . PERIPHERAL VASCULAR CATHETERIZATION N/A 12/05/2014   Procedure:  Carotid Arch Angiography;  Surgeon: Conrad Polkville, MD;  Location: [place] [facility];  Service: Cardiovascular;   Laterality: N/A;  . STERNAL WOUND DEBRIDEMENT N/A 01/06/2018   Procedure: STERNAL WOUND DEBRIDEMENT;  Surgeon: Ivin Poot, MD;  Location: [place];  Service: Thoracic;  Laterality: N/A;  . STERNOTOMY N/A 12/26/2017   Procedure: REDO STERNOTOMY;  Surgeon: Prescott Gum, Collier Salina, MD;  Location: [place];  Service: Thoracic;  Laterality: N/A;  . TEE WITHOUT CARDIOVERSION N/A 12/14/2016   Procedure: TRANSESOPHAGEAL ECHOCARDIOGRAM (TEE);  Surgeon: Prescott Gum, Collier Salina, MD;  Location: [place];  Service: Open Heart Surgery;  Laterality: N/A;  . VIDEO ASSISTED THORACOSCOPY (VATS)/WEDGE RESECTION Right 10/26/2018   Procedure: VIDEO ASSISTED THORACOSCOPY (VATS)/WEDGE RESECTION;  Surgeon: Lajuana Matte, MD;  Location: [place];  Service: Thoracic;  Laterality: Right;  Marland Kitchen VIDEO BRONCHOSCOPY N/A 10/26/2018   Procedure: VIDEO BRONCHOSCOPY;  Surgeon: Lajuana Matte, MD;  Location: [place];  Service: Thoracic;  Laterality: N/A;    Social History:  reports that she quit smoking about 4 years ago. She has a 80.00 pack-year smoking history. She has never used smokeless tobacco. She reports that she does not drink alcohol and does not use drugs.   No Known Allergies  Family History  Problem Relation Age of Onset  . Heart attack Mother   . Cancer Mother   . Diabetes Mother   . Heart disease Mother   .  Heart attack Father   . Heart disease Father      Prior to Admission medications   Medication Sig Start Date End Date Taking? Authorizing Provider  albuterol (PROVENTIL) (2.5 MG/3ML) 0.083% nebulizer solution Take 2.5 mg by nebulization every 4 (four) hours as needed. 03/30/19  Yes [provider]  ALPRAZolam Duanne Moron) 0.5 MG tablet Take 0.5 mg by mouth 3 (three) times daily.    Yes [provider]  APIXABAN [name]) VTE STARTER PACK (10MG  AND 5MG ) Take as directed on package: start with two-5mg  tablets twice daily for 7 days. On day 8, switch to one-5mg  tablet twice daily. 11/06/19  Yes Oretha Milch D, MD  Ascorbic Acid (VITAMIN C GUMMIE) 120 MG CHEW Chew 500 mg by mouth.   Yes [provider]  aspirin 81 MG chewable tablet Chew 81 mg by mouth daily.   Yes [provider]  atorvastatin (LIPITOR) 40 MG tablet Take 1 tablet (40 mg total) daily by mouth. 12/27/16  Yes Gold, Wayne E, PA-C  calcium carbonate (OS-CAL) 1250 (500 Ca) MG chewable tablet Chew 1 tablet by mouth daily.   Yes [provider]  cholecalciferol (VITAMIN D3) 25 MCG (1000 UNIT) tablet Take 1,000 Units by mouth daily.   Yes [provider]  diphenhydramine-acetaminophen (TYLENOL PM) 25-500 MG TABS tablet Take 2-3 tablets by mouth at bedtime.   Yes [provider]  guaiFENesin (MUCINEX) 600 MG 12 hr tablet Take 1 tablet (600 mg total) by mouth 2 (two) times daily as needed. 11/06/19  Yes Oretha Milch D, MD  isosorbide mononitrate (IMDUR) 30 MG 24 hr tablet Take 0.5 tablets (15 mg total) by mouth daily. 03/21/19  Yes BranchAlphonse Guild, MD  metoprolol succinate (TOPROL-XL) 50 MG 24 hr tablet Take 50 mg by mouth daily. Take with or immediately following a meal.   Yes [provider]  Multiple Vitamin (MULTIVITAMIN WITH MINERALS) TABS tablet Take 1 tablet by mouth daily. 12/01/18  Yes Lars Pinks M, PA-C  pantoprazole (PROTONIX) 40 MG tablet Take 40 mg by mouth daily.   Yes [provider]  potassium chloride SA (K-DUR,KLOR-CON) 20 MEQ tablet Take 20 mEq by mouth daily.  10/20/17  Yes [provider]  QUEtiapine (SEROQUEL) 25 MG tablet Take 1 tablet (25 mg total) by mouth at bedtime. 11/06/19  Yes Oretha Milch D, MD  Tiotropium Bromide-Olodaterol (STIOLTO RESPIMAT) 2.5-2.5 MCG/ACT AERS Inhale 2 puffs into the lungs daily. 11/06/19  Yes Oretha Milch D, MD  torsemide (DEMADEX) 20 MG tablet Take 1 tablet (20 mg total) by mouth daily. 11/01/18  Yes Nani Skillern, PA-C    Physical Exam: BP (!) 99/55   Pulse 91   Resp (!) 27   Ht 5\' 5"  (1.651  m)   Wt 81 kg   SpO2 99%   BMI 29.72 kg/m   . General: 64 y.o. year-old female well developed well nourished in no acute distress.  Alert and oriented x3. Marland Kitchen HEENT: Hematoma around the eyes, midportion of lower lip laceration, EOMI . Neck: Supple, trachea midline . Cardiovascular: Regular rate and rhythm with no rubs or gallops.  No thyromegaly or JVD noted.  No lower extremity edema. 2/4 pulses in all 4 extremities. Marland Kitchen Respiratory: Diffuse coarse breath sounds with expiratory wheezing with no increased work of breathing.  . Abdomen: Soft nontender nondistended with normal bowel sounds x4 quadrants. . Muskuloskeletal: No cyanosis, clubbing or edema noted bilaterally . Neuro: CN II-XII intact, strength, sensation, reflexes . Skin:  No ulcerative lesions noted or rashes . Psychiatry: Judgement and insight appear normal. Mood is appropriate for condition and setting          Labs on Admission:  Basic Metabolic Panel: Recent Labs  Lab 11/12/19 1851  NA 138  K 3.9  CL 98  CO2 34*  GLUCOSE 100*  BUN 6*  CREATININE 0.39*  CALCIUM 8.8*   Liver Function Tests: Recent Labs  Lab 11/12/19 1851  AST 22  ALT 19  ALKPHOS 59  BILITOT 0.7  PROT 6.3*  ALBUMIN 3.0*   Recent Labs  Lab 11/12/19 1851  LIPASE 23   No results for input(s): AMMONIA in the last 168 hours. CBC: Recent Labs  Lab 11/06/19 0127 11/12/19 1851  WBC 9.3 5.2  NEUTROABS  --  4.0  HGB 10.0* 9.9*  HCT 33.3* 33.1*  MCV 90.5 93.0  PLT 175 196   Cardiac Enzymes: Recent Labs  Lab 11/12/19 1851  CKTOTAL 141    BNP (last 3 results) Recent Labs    10/25/19 2133 11/12/19 1851  BNP 905.0* 337.0*    ProBNP (last 3 results) No results for input(s): PROBNP in the last 8760 hours.  CBG: No results for input(s): GLUCAP in the last 168 hours.  Radiological Exams on Admission: DG Chest 2 View  Result Date: 11/12/2019 CLINICAL DATA:  Status post fall. EXAM: CHEST - 2 VIEW COMPARISON:  November 02, 2019  FINDINGS: Multiple sternal wires are seen. Mild, diffuse, chronic appearing increased interstitial lung markings are noted. Radiopaque surgical clips are seen along the medial aspect of the right apex. Ill-defined surgical sutures are seen overlying the right lung base. Mild right basilar atelectasis is also present. There is very mild blunting of the right costophrenic angle. No pneumothorax is seen. The heart size and mediastinal contours are within normal limits. There is mild calcification of the aortic arch. Surgical clips are seen overlying the right upper quadrant. The visualized skeletal structures are unremarkable. IMPRESSION: 1. Evidence of prior median sternotomy/CABG. 2. Postoperative changes within the right lung base with mild right basilar atelectasis. 3. Very small right pleural effusion. Electronically Signed   By: Virgina Norfolk M.D.   On: 11/12/2019 18:08   CT Head Wo Contrast  Result Date: 11/12/2019 CLINICAL DATA:  Poly trauma, critical, head/cervical spine injury suspected. Facial trauma. Additional history provided: Fall, laceration to bottom lip, multiple bruises, patient also reports falling yesterday. EXAM: CT HEAD WITHOUT CONTRAST CT MAXILLOFACIAL WITHOUT CONTRAST CT CERVICAL SPINE WITHOUT CONTRAST TECHNIQUE: Multidetector CT imaging of the head, cervical spine, and maxillofacial structures were performed using the standard protocol without intravenous contrast. Multiplanar CT image reconstructions of the cervical spine and maxillofacial structures were also generated. COMPARISON:  Head CT 10/25/2019. FINDINGS: CT HEAD FINDINGS Brain: Stable, mild generalized cerebral atrophy. Stable, mild patchy hypoattenuation within the cerebral white matter which is nonspecific, but consistent with chronic small vessel ischemic disease. There is acute parafalcine subdural hemorrhage measuring up to 4 mm (for instance as seen on series 2, image 26) (series 4, image 35). Trace acute subdural  hemorrhage is also questioned along the left tentorium (series 2, images 11 and 12). No demarcated cortical infarct is identified. No evidence of intracranial mass. No midline shift. Vascular: No hyperdense vessel. Skull: Normal. Negative for fracture or focal lesion. Other: Bilateral mastoid effusions. CT MAXILLOFACIAL FINDINGS Osseous: Motion degradation limits evaluation of the mandibular body. No acute maxillofacial fracture is identified. Orbits: No acute finding. The globes are normal in size and  contour. The extraocular muscles and optic nerve sheath complexes are symmetric and unremarkable. Sinuses: Mild ethmoid and right sphenoid sinus mucosal thickening. Soft tissues: Midline forehead and right maxillofacial soft tissue swelling/hematoma. Incidentally noted anterior nasal septal defect (series 8, image 19). CT CERVICAL SPINE FINDINGS Alignment: A mild cervical levocurvature may be positional. Straightening of the expected cervical lordosis. Trace C5-C6 grade 1 retrolisthesis Skull base and vertebrae: The basion-dental and atlanto-dental intervals are maintained.No evidence of acute fracture to the cervical spine. Soft tissues and spinal canal: No prevertebral fluid or swelling. No visible canal hematoma. Disc levels: Cervical spondylosis with multilevel disc space narrowing, posterior disc osteophytes, uncovertebral and facet hypertrophy. These findings are greatest at C5-C6, C6-C7 and C7-T1. Upper chest: No consolidation within the imaged lung apices. No visible pneumothorax. These results were called by telephone at the time of interpretation on 11/12/2019 at 6:16 pm to provider Carmin Muskrat , who verbally acknowledged these results. IMPRESSION: CT head: 1. Acute parafalcine subdural hemorrhage measuring up to 4 mm. Trace acute subdural hemorrhage is also questioned along the left tentorium. 2. Stable mild generalized cerebral atrophy and chronic small vessel ischemic disease. 3. Bilateral mastoid  effusions. CT maxillofacial 1. Evaluation of the mandibular body is limited due to motion degradation. 2. No acute maxillofacial fracture is identified. 3. Midline forehead and right maxillofacial soft tissue swelling/hematoma. 4. Mild ethmoid and right sphenoid sinus mucosal thickening. 5. Incidentally noted anterior nasal septal defect. CT cervical spine: 1. No evidence of acute fracture to the cervical spine. 2. Trace C5-C6 grade 1 retrolisthesis. 3. Cervical spondylosis as described and greatest at C5-C6, C6-C7 and C7-T1. Electronically Signed   By: Kellie Simmering DO   On: 11/12/2019 18:17   CT Cervical Spine Wo Contrast  Result Date: 11/12/2019 CLINICAL DATA:  Poly trauma, critical, head/cervical spine injury suspected. Facial trauma. Additional history provided: Fall, laceration to bottom lip, multiple bruises, patient also reports falling yesterday. EXAM: CT HEAD WITHOUT CONTRAST CT MAXILLOFACIAL WITHOUT CONTRAST CT CERVICAL SPINE WITHOUT CONTRAST TECHNIQUE: Multidetector CT imaging of the head, cervical spine, and maxillofacial structures were performed using the standard protocol without intravenous contrast. Multiplanar CT image reconstructions of the cervical spine and maxillofacial structures were also generated. COMPARISON:  Head CT 10/25/2019. FINDINGS: CT HEAD FINDINGS Brain: Stable, mild generalized cerebral atrophy. Stable, mild patchy hypoattenuation within the cerebral white matter which is nonspecific, but consistent with chronic small vessel ischemic disease. There is acute parafalcine subdural hemorrhage measuring up to 4 mm (for instance as seen on series 2, image 26) (series 4, image 35). Trace acute subdural hemorrhage is also questioned along the left tentorium (series 2, images 11 and 12). No demarcated cortical infarct is identified. No evidence of intracranial mass. No midline shift. Vascular: No hyperdense vessel. Skull: Normal. Negative for fracture or focal lesion. Other: Bilateral  mastoid effusions. CT MAXILLOFACIAL FINDINGS Osseous: Motion degradation limits evaluation of the mandibular body. No acute maxillofacial fracture is identified. Orbits: No acute finding. The globes are normal in size and contour. The extraocular muscles and optic nerve sheath complexes are symmetric and unremarkable. Sinuses: Mild ethmoid and right sphenoid sinus mucosal thickening. Soft tissues: Midline forehead and right maxillofacial soft tissue swelling/hematoma. Incidentally noted anterior nasal septal defect (series 8, image 19). CT CERVICAL SPINE FINDINGS Alignment: A mild cervical levocurvature may be positional. Straightening of the expected cervical lordosis. Trace C5-C6 grade 1 retrolisthesis Skull base and vertebrae: The basion-dental and atlanto-dental intervals are maintained.No evidence of acute fracture to the cervical spine.  Soft tissues and spinal canal: No prevertebral fluid or swelling. No visible canal hematoma. Disc levels: Cervical spondylosis with multilevel disc space narrowing, posterior disc osteophytes, uncovertebral and facet hypertrophy. These findings are greatest at C5-C6, C6-C7 and C7-T1. Upper chest: No consolidation within the imaged lung apices. No visible pneumothorax. These results were called by telephone at the time of interpretation on 11/12/2019 at 6:16 pm to provider Carmin Muskrat , who verbally acknowledged these results. IMPRESSION: CT head: 1. Acute parafalcine subdural hemorrhage measuring up to 4 mm. Trace acute subdural hemorrhage is also questioned along the left tentorium. 2. Stable mild generalized cerebral atrophy and chronic small vessel ischemic disease. 3. Bilateral mastoid effusions. CT maxillofacial 1. Evaluation of the mandibular body is limited due to motion degradation. 2. No acute maxillofacial fracture is identified. 3. Midline forehead and right maxillofacial soft tissue swelling/hematoma. 4. Mild ethmoid and right sphenoid sinus mucosal thickening.  5. Incidentally noted anterior nasal septal defect. CT cervical spine: 1. No evidence of acute fracture to the cervical spine. 2. Trace C5-C6 grade 1 retrolisthesis. 3. Cervical spondylosis as described and greatest at C5-C6, C6-C7 and C7-T1. Electronically Signed   By: Kellie Simmering DO   On: 11/12/2019 18:17   CT Maxillofacial WO CM  Result Date: 11/12/2019 CLINICAL DATA:  Poly trauma, critical, head/cervical spine injury suspected. Facial trauma. Additional history provided: Fall, laceration to bottom lip, multiple bruises, patient also reports falling yesterday. EXAM: CT HEAD WITHOUT CONTRAST CT MAXILLOFACIAL WITHOUT CONTRAST CT CERVICAL SPINE WITHOUT CONTRAST TECHNIQUE: Multidetector CT imaging of the head, cervical spine, and maxillofacial structures were performed using the standard protocol without intravenous contrast. Multiplanar CT image reconstructions of the cervical spine and maxillofacial structures were also generated. COMPARISON:  Head CT 10/25/2019. FINDINGS: CT HEAD FINDINGS Brain: Stable, mild generalized cerebral atrophy. Stable, mild patchy hypoattenuation within the cerebral white matter which is nonspecific, but consistent with chronic small vessel ischemic disease. There is acute parafalcine subdural hemorrhage measuring up to 4 mm (for instance as seen on series 2, image 26) (series 4, image 35). Trace acute subdural hemorrhage is also questioned along the left tentorium (series 2, images 11 and 12). No demarcated cortical infarct is identified. No evidence of intracranial mass. No midline shift. Vascular: No hyperdense vessel. Skull: Normal. Negative for fracture or focal lesion. Other: Bilateral mastoid effusions. CT MAXILLOFACIAL FINDINGS Osseous: Motion degradation limits evaluation of the mandibular body. No acute maxillofacial fracture is identified. Orbits: No acute finding. The globes are normal in size and contour. The extraocular muscles and optic nerve sheath complexes are  symmetric and unremarkable. Sinuses: Mild ethmoid and right sphenoid sinus mucosal thickening. Soft tissues: Midline forehead and right maxillofacial soft tissue swelling/hematoma. Incidentally noted anterior nasal septal defect (series 8, image 19). CT CERVICAL SPINE FINDINGS Alignment: A mild cervical levocurvature may be positional. Straightening of the expected cervical lordosis. Trace C5-C6 grade 1 retrolisthesis Skull base and vertebrae: The basion-dental and atlanto-dental intervals are maintained.No evidence of acute fracture to the cervical spine. Soft tissues and spinal canal: No prevertebral fluid or swelling. No visible canal hematoma. Disc levels: Cervical spondylosis with multilevel disc space narrowing, posterior disc osteophytes, uncovertebral and facet hypertrophy. These findings are greatest at C5-C6, C6-C7 and C7-T1. Upper chest: No consolidation within the imaged lung apices. No visible pneumothorax. These results were called by telephone at the time of interpretation on 11/12/2019 at 6:16 pm to provider Carmin Muskrat , who verbally acknowledged these results. IMPRESSION: CT head: 1. Acute parafalcine subdural hemorrhage  measuring up to 4 mm. Trace acute subdural hemorrhage is also questioned along the left tentorium. 2. Stable mild generalized cerebral atrophy and chronic small vessel ischemic disease. 3. Bilateral mastoid effusions. CT maxillofacial 1. Evaluation of the mandibular body is limited due to motion degradation. 2. No acute maxillofacial fracture is identified. 3. Midline forehead and right maxillofacial soft tissue swelling/hematoma. 4. Mild ethmoid and right sphenoid sinus mucosal thickening. 5. Incidentally noted anterior nasal septal defect. CT cervical spine: 1. No evidence of acute fracture to the cervical spine. 2. Trace C5-C6 grade 1 retrolisthesis. 3. Cervical spondylosis as described and greatest at C5-C6, C6-C7 and C7-T1. Electronically Signed   By: Kellie Simmering DO   On:  11/12/2019 18:17    EKG: I independently viewed the EKG done and my findings are as followed: No EKG was done in the ED  Assessment/Plan Present on Admission: . Subdural hematoma ([place]) . CAD (coronary artery disease)/Prior CABG . GERD (gastroesophageal reflux disease) . Thrombus  Principal Problem:   Subdural hematoma ([facility]) Active Problems:   GERD (gastroesophageal reflux disease)   CAD (coronary artery disease)/Prior CABG   Thrombus   Recurrent falls   Diastolic CHF, chronic ([facility])   Lip laceration   Hypoalbuminemia   Subdural hematoma secondary to recurrent falls possibly due to polypharmacy Patient has had recurrent falls within last few days, review of home meds showed that patient takes several sedative medications including: Xanax 0.5 mg p.o. 3 times daily Quetiapine 25 mg at bedtime Diphenhydramine-acetaminophen 25-500 mg 2-3 tabs at bedtime These will be held at this time Patient was on Eliquis due to incidental finding of pulmonary artery thrombus during prior admission, she was also noted to be on aspirin, these will also be held at this time. CT head without contrast showed acute parafalcine subdural hemorrhage measuring up to 4 mm with questionable trace acute subdural hemorrhage along the left tentorium. ED physician consulted neurosurgery team who recommended observing patient overnight and repeat CT of head in the morning with plan to discharge patient if CT scan remained stable and for patient to follow-up in their office as an outpatient.  Lip laceration s/p suture of laceration Stable, continue wound care  GERD Continue Protonix  CAD s/p CABG Continue Lipitor Aspirin temporarily held at this time due to subdural hematoma Metoprolol temporarily held at this time due to soft BP  COPD (not in acute exacerbation) Patient presents with coarse breath sounds and expiratory wheezing.  She was recently discharged from [facility] due to pneumonia Continue DuoNebs every 6  hours and every 4 hours as needed Continue Mucinex per home regimen Continue incentive spirometry  Acute pulmonary artery thrombus Incidentally found in right pulmonary artery in distal portion of residual stump of RUL branch of right pulmonary artery on CTA chest during last admission at [facility] (Discharged on 9/21) and was started on Eliquis (for 3-6 months).  This will be temporarily held at this time due to subdural hematoma as recommended by neurosurgery team   CHF with preserved EF, grade II diastolic dysfunction Aortic stenosis (moderate to severe) Elevated BNP in the setting of above BNP 337 (this was 905, 2 weeks ago) TTE (11/04/2019) showed LVEF of 60-65%, LVH no regional wall motion abnormalities, but left ventricular diastolic parameters are consistent with grade 2 diastolic dysfunction (pseudonormalization) and elevated left ventricular end-diastolic pressure. Beta-blocker and torsemide held at this time due to soft BP Patient follows with Dr. Harl Bowie as an outpatient  Hypoalbuminemia possibly secondary to mild protein calorie  malnutrition. Albumin 3.0, protein supplement will be provided   DVT prophylaxis: SCDs  Code Status: Full code  Family Communication: None at bedside  Disposition Plan:  Patient is from:                        home Anticipated DC to:                   home Anticipated DC date:               1 day Anticipated DC barriers:          Patient unstable for discharge at this time due to subdural hematoma requiring overnight observation with repeat CT in the morning prior to decision on time of discharge.   Consults called: Neurosurgery (by ED physician)  Admission status: Observation    Bernadette Hoit MD [facility]  If 7PM-7AM, please contact night-coverage [email or web address] Password [facility]  11/12/2019, 10:07 PM

## 2019-11-12 NOTE — Progress Notes (Signed)
Called in regards to this patients head CT after sustaining a fall. CT shows a acute parafalcine sdh measuring about 76mm in size with no mass effect or midline shift. Patient is on eliquis. Recommend admission with repeat head ct in the morning an hold all blood thinners. If scan is stable in the morning, may be discharged home with follow up in our office

## 2019-11-12 NOTE — Patient Outreach (Signed)
[facility]) Care Management  11/12/2019  DAVINITY FANARA Nov 04, 1955 [id number]   Telephone Assessment   Unsuccessful outreach attempt to patient.     Plan: RN CM will make outreach attempt to patient within the month of Oct.   [name] [facility] Telephonic Care Management Coordinator Direct Phone: [phone] Toll Free: [phone] Fax: [phone]

## 2019-11-12 NOTE — ED Triage Notes (Signed)
Ems reports was called out for fall.  Says arrived to find pt between her bed and the wall.  Pt has laceration to bottom lip and multiple bruises all over.  Reports had been in the floor for 4 hours pta.  Pt alert and oriented.  Reports also fell yesterday  But says unknown why she is falling.  EMS says pt told them she tripped over her oxygen cord today.  cbg 125,  bp 114/67, hr 104, 99% on o2 at 3liters.  Reports is on home o2 at 3liters continuously.

## 2019-11-13 ENCOUNTER — Ambulatory Visit: Payer: Medicare HMO

## 2019-11-13 ENCOUNTER — Encounter (HOSPITAL_COMMUNITY): Payer: Medicare HMO

## 2019-11-13 ENCOUNTER — Telehealth: Payer: Self-pay

## 2019-11-13 ENCOUNTER — Observation Stay (HOSPITAL_COMMUNITY): Payer: Medicare HMO

## 2019-11-13 ENCOUNTER — Observation Stay (HOSPITAL_COMMUNITY)
Admit: 2019-11-13 | Discharge: 2019-11-13 | Disposition: A | Discharge status: Home / Self Care | Payer: Medicare HMO | Attending: Internal Medicine | Admitting: Internal Medicine

## 2019-11-13 DIAGNOSIS — R296 Repeated falls: Secondary | ICD-10-CM | POA: Diagnosis not present

## 2019-11-13 DIAGNOSIS — S0083XA Contusion of other part of head, initial encounter: Secondary | ICD-10-CM | POA: Diagnosis not present

## 2019-11-13 DIAGNOSIS — S01511D Laceration without foreign body of lip, subsequent encounter: Secondary | ICD-10-CM | POA: Diagnosis not present

## 2019-11-13 DIAGNOSIS — J9611 Chronic respiratory failure with hypoxia: Secondary | ICD-10-CM | POA: Diagnosis not present

## 2019-11-13 DIAGNOSIS — H748X3 Other specified disorders of middle ear and mastoid, bilateral: Secondary | ICD-10-CM | POA: Diagnosis not present

## 2019-11-13 DIAGNOSIS — I5032 Chronic diastolic (congestive) heart failure: Secondary | ICD-10-CM | POA: Diagnosis not present

## 2019-11-13 DIAGNOSIS — S0003XA Contusion of scalp, initial encounter: Secondary | ICD-10-CM | POA: Diagnosis not present

## 2019-11-13 DIAGNOSIS — R55 Syncope and collapse: Secondary | ICD-10-CM

## 2019-11-13 DIAGNOSIS — S065X9A Traumatic subdural hemorrhage with loss of consciousness of unspecified duration, initial encounter: Secondary | ICD-10-CM | POA: Diagnosis not present

## 2019-11-13 DIAGNOSIS — I350 Nonrheumatic aortic (valve) stenosis: Secondary | ICD-10-CM | POA: Diagnosis not present

## 2019-11-13 LAB — COMPREHENSIVE METABOLIC PANEL
ALT: 18 U/L (ref 0–44)
AST: 18 U/L (ref 15–41)
Albumin: 2.8 g/dL — ABNORMAL LOW (ref 3.5–5.0)
Alkaline Phosphatase: 55 U/L (ref 38–126)
Anion gap: 8 (ref 5–15)
BUN: 7 mg/dL — ABNORMAL LOW (ref 8–23)
CO2: 32 mmol/L (ref 22–32)
Calcium: 8.7 mg/dL — ABNORMAL LOW (ref 8.9–10.3)
Chloride: 100 mmol/L (ref 98–111)
Creatinine, Ser: 0.38 mg/dL — ABNORMAL LOW (ref 0.44–1.00)
GFR calc Af Amer: >60 mL/min (ref >60)
GFR calc non Af Amer: >60 mL/min (ref >60)
Glucose, Bld: 98 mg/dL (ref 70–99)
Potassium: 3.6 mmol/L (ref 3.5–5.1)
Sodium: 140 mmol/L (ref 135–145)
Total Bilirubin: 0.7 mg/dL (ref 0.3–1.2)
Total Protein: 5.8 g/dL — ABNORMAL LOW (ref 6.5–8.1)

## 2019-11-13 LAB — CBC
HCT: 32.8 % — ABNORMAL LOW (ref 36.0–46.0)
Hemoglobin: 9.6 g/dL — ABNORMAL LOW (ref 12.0–15.0)
MCH: 27.7 pg (ref 26.0–34.0)
MCHC: 29.3 g/dL — ABNORMAL LOW (ref 30.0–36.0)
MCV: 94.5 fL (ref 80.0–100.0)
Platelets: 193 10*3/uL (ref 150–400)
RBC: 3.47 MIL/uL — ABNORMAL LOW (ref 3.87–5.11)
RDW: 15.6 % — ABNORMAL HIGH (ref 11.5–15.5)
WBC: 4.2 10*3/uL (ref 4.0–10.5)
nRBC: 0 % (ref 0.0–0.2)

## 2019-11-13 LAB — PROTIME-INR
INR: 1.2 (ref 0.8–1.2)
Prothrombin Time: 15 seconds (ref 11.4–15.2)

## 2019-11-13 LAB — APTT: aPTT: 33 seconds (ref 24–36)

## 2019-11-13 LAB — PHOSPHORUS: Phosphorus: 4.1 mg/dL (ref 2.5–4.6)

## 2019-11-13 LAB — MAGNESIUM: Magnesium: 2 mg/dL (ref 1.7–2.4)

## 2019-11-13 MED ORDER — BUDESONIDE 0.5 MG/2ML IN SUSP
0.5000 mg | Freq: Two times a day (BID) | RESPIRATORY_TRACT | Status: DC
  Administered 2019-11-13: 0.5 mg via RESPIRATORY_TRACT
  Filled 2019-11-13: qty 2

## 2019-11-13 MED ORDER — IPRATROPIUM-ALBUTEROL 0.5-2.5 (3) MG/3ML IN SOLN
3.0000 mL | Freq: Four times a day (QID) | RESPIRATORY_TRACT | Status: DC
  Administered 2019-11-13 (×2): 3 mL via RESPIRATORY_TRACT
  Filled 2019-11-13 (×2): qty 3

## 2019-11-13 MED ORDER — POTASSIUM CHLORIDE CRYS ER 20 MEQ PO TBCR: 20.0000 meq | EXTENDED_RELEASE_TABLET | ORAL | 2 refills | Status: DC

## 2019-11-13 MED ORDER — TORSEMIDE 20 MG PO TABS: 20.0000 mg | ORAL_TABLET | ORAL | Status: DC

## 2019-11-13 MED ORDER — ALPRAZOLAM 0.5 MG PO TABS: 0.5000 mg | ORAL_TABLET | Freq: Three times a day (TID) | ORAL | Status: DC | PRN

## 2019-11-13 NOTE — Addendum Note (Signed)
Addended by: Barbarann Ehlers A on: 11/13/2019 03:44 PM   Modules accepted: Orders

## 2019-11-13 NOTE — Discharge Summary (Signed)
Physician Discharge Summary  Emily Rocha OEU:[id number] DOB: 1955-11-28 DOA: 11/12/2019  PCP: No primary care provider on file.  Admit date: 11/12/2019 Discharge date: 11/13/2019  Admitted From: Home Disposition:  Home   Recommendations for Outpatient Follow-up:  1. Follow up with PCP in 1-2 weeks 2. Please obtain BMP/CBC in one week   Home Health: yes Equipment/Devices: HHPT, 2L [place] (rest), 3L (activity)  Discharge Condition: Stable CODE STATUS:FULL Diet recommendation: Heart Healthy    Brief/Interim Summary: 64 year old female with a history of chronic respiratory failure on 3 L, COPD, coronary artery disease, adenocarcinoma of the right lung status post right upper lobe lobectomy, diastolic CHF, moderate to severe aortic stenosis, presenting after episodes of falling and syncope.  Initially, the patient told the physicians in the emergency department that she did not lose consciousness.  However in the morning of 11/13/2019 the patient told me the she had had syncopal episode on 11/11/2019 and 11/12/2019.  The patient stated that she was walking down the hallway without any assistance when she "blacked out".  She stated that she woke up with her face on the ground.  She broke her dentures.  Apparently, the patient had laid on the ground for approximately 4 hours.  There was no bowel or bladder incontinence.  The patient denied any prodromal symptoms including aura, palpitations, chest discomfort, shortness of breath.  Notably, the patient had recent hospital admission from 10/24/2019 to 11/06/2019.  Her hospitalization was prolonged secondary to hospital delirium and acute on chronic respiratory failure with hypercarbia and hypoxia.  During that hospitalization, the patient was treated for pneumonia with a complicated loculated right pleural effusion.  Initially, there was concern that this may have represented an empyema.  However repeat imaging showed that the size of the pleural effusion  improved and she did not require any further operative intervention.  She was treated for an acute COPD exacerbation with steroids and required BiPAP for a period of time.  Her hospital delirium required an ICU transfer where she was subsequently weaned off of Precedex on 11/02/2019.  She was subsequently discharged home with home health PT on 3 L nasal cannula with ambulation, 2 L at rest. In the emergency department, the patient was afebrile hemodynamically stable with oxygen saturation 96 to 90% on 2 L.  BMP, LFTs, and CBC were essentially unremarkable.  CT of the brain showed an acute parafalcine SDH and trace acute SDH in the left tentorium.  CT of the maxillofacial area did not show any acute fracture.  CT of the cervical spine was negative.   Discharge Diagnoses:  Subdural hematoma -11/12/2019 CT brain as discussed above -Neurosurgery was consulted--> recommended discontinuing apixaban, nonoperative management, repeat CT on 11/13/2019 -Repeat CT brain--no changes in SDH -PT evaluation-->HHPT -Holding apixaban until the patient follows up with neurosurgery in the office -Hold ASA until follow up with neurosurgery  Syncope -Check orthostatic vital signs -11/04/2019 echo EF 60 to 65%, no WMA, moderate to severe AS with VTI 1.03 cm, mild MR -Cardiology consult-->set up ZIO patch, d/c toprol xl and imdur until she follows up in office, decrease torsemide to MWF from daily -Remain on telemetry--no concerning dysrhythmia -EEG--neg for seizure -orthostatics neg  Chronic respiratory failure with hypoxia -Normally on 2 L at rest, 3 L with ambulation -Stable presently  COPD -Quit smoking 4 years ago -Started duo nebs -Started Pulmicort -restart Stiolto after dc  Moderate to severe aortic stenosis -11/04/2019 echo--EF 60-65%, no WMA, moderate to severe AS with VTI 1.03  cm -Cardiology evaluation as discussed above--did not feel it was to degree to be associated with syncope  Pulmonary  arterial thrombus -Noted on CTA 10/24/2019 -Holding apixaban temporarily secondary to subdural hematoma until she follows up with neurosurgery in the office -Restart apixaban when cleared by neurosurgery -10/29/2019 venous duplex bilateral lower extremities negative for DVT  Chronic diastolic CHF -Clinically euvolemic -Personally reviewed chest x-ray--small right pleural effusion without gross pulmonary edema  Coronary artery disease -No chest pain presently -Status post CABG 2018 -Personally reviewed EKG--sinus rhythm, nonspecific T wave changes -hold imdur and metoprolol succinate until follow up in office -Holding metoprolol succinate secondary to soft blood pressure  Anxiety -Continue home dose alprazolam 0.5 mg 3 times daily  Hyperlipidemia -Continue statin  Remote history of adenocarcinoma the lung status post right upper lobe lobectomy (2018) -Continue outpatient follow-up and surveillance  Impaired glucose tolerance -A1C--6.2 -lifestyle modification  Discharge Instructions   Allergies as of 11/13/2019   No Known Allergies     Medication List    STOP taking these medications   Apixaban Starter Pack (10mg  and 5mg ) Commonly known as: ELIQUIS STARTER PACK   aspirin 81 MG chewable tablet   isosorbide mononitrate 30 MG 24 hr tablet Commonly known as: IMDUR   metoprolol succinate 50 MG 24 hr tablet Commonly known as: TOPROL-XL     TAKE these medications   albuterol (2.5 MG/3ML) 0.083% nebulizer solution Commonly known as: PROVENTIL Take 2.5 mg by nebulization every 4 (four) hours as needed.   ALPRAZolam 0.5 MG tablet Commonly known as: XANAX Take 0.5 mg by mouth 3 (three) times daily.   atorvastatin 40 MG tablet Commonly known as: Lipitor Take 1 tablet (40 mg total) daily by mouth.   calcium carbonate 1250 (500 Ca) MG chewable tablet Commonly known as: OS-CAL Chew 1 tablet by mouth daily.   cholecalciferol 25 MCG (1000 UNIT) tablet Commonly  known as: VITAMIN D3 Take 1,000 Units by mouth daily.   diphenhydramine-acetaminophen 25-500 MG Tabs tablet Commonly known as: TYLENOL PM Take 2-3 tablets by mouth at bedtime.   guaiFENesin 600 MG 12 hr tablet Commonly known as: MUCINEX Take 1 tablet (600 mg total) by mouth 2 (two) times daily as needed.   multivitamin with minerals Tabs tablet Take 1 tablet by mouth daily.   pantoprazole 40 MG tablet Commonly known as: PROTONIX Take 40 mg by mouth daily.   potassium chloride SA 20 MEQ tablet Commonly known as: KLOR-CON Take 1 tablet (20 mEq total) by mouth every Monday, Wednesday, and Friday. Start taking on: November 14, 2019 What changed: when to take this   QUEtiapine 25 MG tablet Commonly known as: SEROQUEL Take 1 tablet (25 mg total) by mouth at bedtime.   Stiolto Respimat 2.5-2.5 MCG/ACT Aers Generic drug: Tiotropium Bromide-Olodaterol Inhale 2 puffs into the lungs daily.   torsemide 20 MG tablet Commonly known as: DEMADEX Take 1 tablet (20 mg total) by mouth every Monday, Wednesday, and Friday. Start taking on: November 14, 2019 What changed: when to take this   Vitamin C Gummie 120 MG Chew Generic drug: Ascorbic Acid Chew 500 mg by mouth.       No Known Allergies  Consultations:  cardiology   Procedures/Studies: DG Chest 2 View  Result Date: 11/12/2019 CLINICAL DATA:  Status post fall. EXAM: CHEST - 2 VIEW COMPARISON:  November 02, 2019 FINDINGS: Multiple sternal wires are seen. Mild, diffuse, chronic appearing increased interstitial lung markings are noted. Radiopaque surgical clips are seen along the medial  aspect of the right apex. Ill-defined surgical sutures are seen overlying the right lung base. Mild right basilar atelectasis is also present. There is very mild blunting of the right costophrenic angle. No pneumothorax is seen. The heart size and mediastinal contours are within normal limits. There is mild calcification of the aortic arch.  Surgical clips are seen overlying the right upper quadrant. The visualized skeletal structures are unremarkable. IMPRESSION: 1. Evidence of prior median sternotomy/CABG. 2. Postoperative changes within the right lung base with mild right basilar atelectasis. 3. Very small right pleural effusion. Electronically Signed   By: Virgina Norfolk M.D.   On: 11/12/2019 18:08   CT HEAD WO CONTRAST  Result Date: 11/13/2019 CLINICAL DATA:  64 year old female found down. Small volume intracranial hemorrhage. Multiple recent falls. EXAM: CT HEAD WITHOUT CONTRAST TECHNIQUE: Contiguous axial images were obtained from the base of the skull through the vertex without intravenous contrast. COMPARISON:  Head CT 11/12/2019. FINDINGS: Brain: Previous feel trace para falcine subdural blood is stable on series 2, images 23, 26. No associated mass effect. No intraventricular blood identified. No other extra-axial blood identified. Basilar cisterns are patent. No midline shift, mass effect, or evidence of intracranial mass lesion. No ventriculomegaly. Patchy bilateral white matter hypodensity appears stable. No cortically based acute infarct identified. Vascular: Calcified atherosclerosis at the skull base. Skull: Stable.  No skull fracture identified. Sinuses/Orbits: Paranasal sinuses are stable and well pneumatized. Mild bilateral mastoid effusions redemonstrated, and new since 10/25/2019. tympanic cavities appear to remain clear. Other: Mild right forehead scalp contusion or hematoma. Partially visible right lateral face contusion or hematoma on series 3, image 1. Orbits soft tissues remain within normal limits. Other scalp soft tissues remain normal. IMPRESSION: 1. Stable trace parafalcine subdural blood since yesterday. No mass effect. 2. No new intracranial abnormality. No skull fracture identified. 3. Mild bilateral mastoid effusions, new from earlier this month. Electronically Signed   By: Genevie Ann M.D.   On: 11/13/2019 09:04    CT Head Wo Contrast  Result Date: 11/12/2019 CLINICAL DATA:  Poly trauma, critical, head/cervical spine injury suspected. Facial trauma. Additional history provided: Fall, laceration to bottom lip, multiple bruises, patient also reports falling yesterday. EXAM: CT HEAD WITHOUT CONTRAST CT MAXILLOFACIAL WITHOUT CONTRAST CT CERVICAL SPINE WITHOUT CONTRAST TECHNIQUE: Multidetector CT imaging of the head, cervical spine, and maxillofacial structures were performed using the standard protocol without intravenous contrast. Multiplanar CT image reconstructions of the cervical spine and maxillofacial structures were also generated. COMPARISON:  Head CT 10/25/2019. FINDINGS: CT HEAD FINDINGS Brain: Stable, mild generalized cerebral atrophy. Stable, mild patchy hypoattenuation within the cerebral white matter which is nonspecific, but consistent with chronic small vessel ischemic disease. There is acute parafalcine subdural hemorrhage measuring up to 4 mm (for instance as seen on series 2, image 26) (series 4, image 35). Trace acute subdural hemorrhage is also questioned along the left tentorium (series 2, images 11 and 12). No demarcated cortical infarct is identified. No evidence of intracranial mass. No midline shift. Vascular: No hyperdense vessel. Skull: Normal. Negative for fracture or focal lesion. Other: Bilateral mastoid effusions. CT MAXILLOFACIAL FINDINGS Osseous: Motion degradation limits evaluation of the mandibular body. No acute maxillofacial fracture is identified. Orbits: No acute finding. The globes are normal in size and contour. The extraocular muscles and optic nerve sheath complexes are symmetric and unremarkable. Sinuses: Mild ethmoid and right sphenoid sinus mucosal thickening. Soft tissues: Midline forehead and right maxillofacial soft tissue swelling/hematoma. Incidentally noted anterior nasal septal defect (series 8,  image 19). CT CERVICAL SPINE FINDINGS Alignment: A mild cervical  levocurvature may be positional. Straightening of the expected cervical lordosis. Trace C5-C6 grade 1 retrolisthesis Skull base and vertebrae: The basion-dental and atlanto-dental intervals are maintained.No evidence of acute fracture to the cervical spine. Soft tissues and spinal canal: No prevertebral fluid or swelling. No visible canal hematoma. Disc levels: Cervical spondylosis with multilevel disc space narrowing, posterior disc osteophytes, uncovertebral and facet hypertrophy. These findings are greatest at C5-C6, C6-C7 and C7-T1. Upper chest: No consolidation within the imaged lung apices. No visible pneumothorax. These results were called by telephone at the time of interpretation on 11/12/2019 at 6:16 pm to provider Carmin Muskrat , who verbally acknowledged these results. IMPRESSION: CT head: 1. Acute parafalcine subdural hemorrhage measuring up to 4 mm. Trace acute subdural hemorrhage is also questioned along the left tentorium. 2. Stable mild generalized cerebral atrophy and chronic small vessel ischemic disease. 3. Bilateral mastoid effusions. CT maxillofacial 1. Evaluation of the mandibular body is limited due to motion degradation. 2. No acute maxillofacial fracture is identified. 3. Midline forehead and right maxillofacial soft tissue swelling/hematoma. 4. Mild ethmoid and right sphenoid sinus mucosal thickening. 5. Incidentally noted anterior nasal septal defect. CT cervical spine: 1. No evidence of acute fracture to the cervical spine. 2. Trace C5-C6 grade 1 retrolisthesis. 3. Cervical spondylosis as described and greatest at C5-C6, C6-C7 and C7-T1. Electronically Signed   By: Kellie Simmering DO   On: 11/12/2019 18:17   CT HEAD WO CONTRAST  Result Date: 10/25/2019 CLINICAL DATA:  64 year old female with hypoxia and altered mental status. EXAM: CT HEAD WITHOUT CONTRAST TECHNIQUE: Contiguous axial images were obtained from the base of the skull through the vertex without intravenous contrast.  COMPARISON:  Head CT 09/28/2017. FINDINGS: Brain: No midline shift, ventriculomegaly, mass effect, evidence of mass lesion, intracranial hemorrhage or evidence of cortically based acute infarction. Scattered, patchy bilateral cerebral white matter hypodensity appears not significantly changed. Otherwise gray-white matter differentiation remains within normal limits. Vascular: Calcified atherosclerosis at the skull base. Skull: No acute osseous abnormality identified. Sinuses/Orbits: Visualized paranasal sinuses and mastoids are stable and well pneumatized. Other: Visualized orbits and scalp soft tissues are within normal limits. IMPRESSION: 1. No acute intracranial abnormality by CT. 2. Patchy chronic white matter changes, most commonly due to chronic small vessel disease. Electronically Signed   By: Genevie Ann M.D.   On: 10/25/2019 16:10   CT Angio Chest PE W and/or Wo Contrast  Result Date: 10/24/2019 CLINICAL DATA:  Chest pain. EXAM: CT ANGIOGRAPHY CHEST WITH CONTRAST TECHNIQUE: Multidetector CT imaging of the chest was performed using the standard protocol during bolus administration of intravenous contrast. Multiplanar CT image reconstructions and MIPs were obtained to evaluate the vascular anatomy. CONTRAST:  131mL OMNIPAQUE IOHEXOL 350 MG/ML SOLN COMPARISON:  February 27, 2019. FINDINGS: Cardiovascular: Atherosclerosis of thoracic aorta is noted without aneurysm or dissection. Status post coronary bypass graft. The patient is status post right upper lobectomy. There is the interval development of thrombus within the most distal portion of the stump of the right upper lobe branch of the right pulmonary artery. No definite embolus is noted. Mild cardiomegaly is noted. No pericardial effusion is noted. Mediastinum/Nodes: Moderate size sliding-type hiatal hernia is noted. Thyroid gland is unremarkable. No adenopathy is noted. Lungs/Pleura: No pneumothorax is noted. Left lung is clear. Moderate size loculated right  pleural effusion is noted with associated subsegmental atelectasis of the right lower lobe. Status post right upper lobectomy as described above.  Upper Abdomen: No acute abnormality. Musculoskeletal: No chest wall abnormality. No acute or significant osseous findings. Review of the MIP images confirms the above findings. IMPRESSION: 1. Status post right upper lobectomy. Interval development of thrombus within the most distal portion of the residual stump of the right upper lobe branch of the right pulmonary artery. No definite pulmonary embolus is noted. 2. Moderate size loculated right pleural effusion is noted with associated subsegmental atelectasis of the right lower lobe. 3. Moderate size sliding-type hiatal hernia. 4. Aortic atherosclerosis. Aortic Atherosclerosis (ICD10-I70.0). Electronically Signed   By: Marijo Conception M.D.   On: 10/24/2019 15:47   CT Cervical Spine Wo Contrast  Result Date: 11/12/2019 CLINICAL DATA:  Poly trauma, critical, head/cervical spine injury suspected. Facial trauma. Additional history provided: Fall, laceration to bottom lip, multiple bruises, patient also reports falling yesterday. EXAM: CT HEAD WITHOUT CONTRAST CT MAXILLOFACIAL WITHOUT CONTRAST CT CERVICAL SPINE WITHOUT CONTRAST TECHNIQUE: Multidetector CT imaging of the head, cervical spine, and maxillofacial structures were performed using the standard protocol without intravenous contrast. Multiplanar CT image reconstructions of the cervical spine and maxillofacial structures were also generated. COMPARISON:  Head CT 10/25/2019. FINDINGS: CT HEAD FINDINGS Brain: Stable, mild generalized cerebral atrophy. Stable, mild patchy hypoattenuation within the cerebral white matter which is nonspecific, but consistent with chronic small vessel ischemic disease. There is acute parafalcine subdural hemorrhage measuring up to 4 mm (for instance as seen on series 2, image 26) (series 4, image 35). Trace acute subdural hemorrhage is  also questioned along the left tentorium (series 2, images 11 and 12). No demarcated cortical infarct is identified. No evidence of intracranial mass. No midline shift. Vascular: No hyperdense vessel. Skull: Normal. Negative for fracture or focal lesion. Other: Bilateral mastoid effusions. CT MAXILLOFACIAL FINDINGS Osseous: Motion degradation limits evaluation of the mandibular body. No acute maxillofacial fracture is identified. Orbits: No acute finding. The globes are normal in size and contour. The extraocular muscles and optic nerve sheath complexes are symmetric and unremarkable. Sinuses: Mild ethmoid and right sphenoid sinus mucosal thickening. Soft tissues: Midline forehead and right maxillofacial soft tissue swelling/hematoma. Incidentally noted anterior nasal septal defect (series 8, image 19). CT CERVICAL SPINE FINDINGS Alignment: A mild cervical levocurvature may be positional. Straightening of the expected cervical lordosis. Trace C5-C6 grade 1 retrolisthesis Skull base and vertebrae: The basion-dental and atlanto-dental intervals are maintained.No evidence of acute fracture to the cervical spine. Soft tissues and spinal canal: No prevertebral fluid or swelling. No visible canal hematoma. Disc levels: Cervical spondylosis with multilevel disc space narrowing, posterior disc osteophytes, uncovertebral and facet hypertrophy. These findings are greatest at C5-C6, C6-C7 and C7-T1. Upper chest: No consolidation within the imaged lung apices. No visible pneumothorax. These results were called by telephone at the time of interpretation on 11/12/2019 at 6:16 pm to provider Carmin Muskrat , who verbally acknowledged these results. IMPRESSION: CT head: 1. Acute parafalcine subdural hemorrhage measuring up to 4 mm. Trace acute subdural hemorrhage is also questioned along the left tentorium. 2. Stable mild generalized cerebral atrophy and chronic small vessel ischemic disease. 3. Bilateral mastoid effusions. CT  maxillofacial 1. Evaluation of the mandibular body is limited due to motion degradation. 2. No acute maxillofacial fracture is identified. 3. Midline forehead and right maxillofacial soft tissue swelling/hematoma. 4. Mild ethmoid and right sphenoid sinus mucosal thickening. 5. Incidentally noted anterior nasal septal defect. CT cervical spine: 1. No evidence of acute fracture to the cervical spine. 2. Trace C5-C6 grade 1 retrolisthesis. 3. Cervical spondylosis  as described and greatest at C5-C6, C6-C7 and C7-T1. Electronically Signed   By: Kellie Simmering DO   On: 11/12/2019 18:17   [place] CHEST (PLEURAL EFFUSION)  Result Date: 10/25/2019 CLINICAL DATA:  RIGHT pleural effusion by CT, history of COPD, GERD, RIGHT upper lobe lung cancer post upper lobectomy EXAM: CHEST ULTRASOUND COMPARISON:  CT angio chest 10/24/2019 FINDINGS: Small pleural effusion identified at the RIGHT lung base. Effusion is partially loculated. Atelectatic RIGHT lower lobe is adherent to the posterior chest wall and prevents safe access of the inferior thoracic cavity for thoracentesis. This appearance was seen on the prior CT but appears fixed with change in position on ultrasound. IMPRESSION: Small loculated RIGHT pleural effusion, unable to safely access for thoracentesis. Electronically Signed   By: Lavonia Dana M.D.   On: 10/25/2019 11:00   CT CHEST LIMITED WO CONTRAST  Result Date: 11/02/2019 INDICATION: Loculated right-sided pleural effusion. Please perform image guided chest tube placement for infection source control purposes. Note, there is attempt earlier this week to perform image guided chest tube placement however the patient could not tolerate the examination secondary to altered mental status. EXAM: LIMITED CHEST CT COMPARISON:  Chest CT-10/24/2019 MEDICATIONS: None ANESTHESIA/SEDATION: None CONTRAST:  None COMPLICATIONS: None immediate. PROCEDURE: Informed written consent was obtained from the patient after a discussion of the  risks, benefits and alternatives to treatment. The patient was placed supine, slightly LPO on the CT gantry and a pre procedural CT was performed demonstrating significant reduction, near complete resolution of residual trace right-sided pleural effusion with minimal amount of fluid seen at the level of the right lung apex and posteromedial aspect of the right lung base, too small to warrant CT-guided chest tube placement. Above was discussed with referring cardiothoracic surgeon, Dr. Kipp Brood, the decision was made not to proceed with intervention at this time. IMPRESSION: Significant reduction/near resolution of residual trace right-sided effusion, too small to warrant CT-guided chest tube placement. No procedure performed. Electronically Signed   By: Sandi Mariscal M.D.   On: 11/02/2019 10:43   DG CHEST PORT 1 VIEW  Result Date: 11/02/2019 CLINICAL DATA:  Pleural effusion EXAM: PORTABLE CHEST 1 VIEW COMPARISON:  10/31/2019 FINDINGS: Prior median sternotomy and CABG. Heart is normal size. Bibasilar airspace opacities, right greater than left with some improvement on the right since prior study. No effusions or pneumothorax. IMPRESSION: Bilateral lower lobe airspace opacities, right greater than left with some improvement in aeration at the right base since prior study. Electronically Signed   By: Rolm Baptise M.D.   On: 11/02/2019 11:22   DG CHEST PORT 1 VIEW  Result Date: 10/31/2019 CLINICAL DATA:  Shortness of breath EXAM: PORTABLE CHEST 1 VIEW COMPARISON:  October 29, 2019 chest radiograph and chest CT October 24, 2019 FINDINGS: There is a persistent fairly small right pleural effusion. There is ill-defined airspace opacity in the right base with apparent postoperative change in this area. Elsewhere, there is mild interstitial edema. There is cardiomegaly with pulmonary venous hypertension. Patient is status post internal mammary bypass grafting. No adenopathy. There is calcification in the aortic arch  as well as in the left carotid artery. IMPRESSION: Cardiomegaly with pulmonary vascular congestion. Interstitial pulmonary edema with right pleural effusion. These are findings felt to be indicative of a degree of underlying congestive heart failure. There is superimposed airspace opacity in the right base which may represent alveolar edema or a degree of superimposed pneumonia. Postoperative changes noted. There is left carotid artery calcification. Aortic Atherosclerosis (ICD10-I70.0).  Electronically Signed   By: Lowella Grip III M.D.   On: 10/31/2019 08:11   DG CHEST PORT 1 VIEW  Result Date: 10/29/2019 CLINICAL DATA:  Shortness of breath, respiratory failure, pleural effusion EXAM: PORTABLE CHEST 1 VIEW COMPARISON:  CTA chest dated 10/24/2019 FINDINGS: Small to moderate right pleural effusion, possibly mildly improved. Associated right lower lobe opacity, likely atelectasis. Postsurgical changes in the right upper hemithorax. Left lung is clear. No frank interstitial edema. Cardiomegaly. Postsurgical changes related to prior CABG. Median sternotomy. IMPRESSION: Small to moderate right pleural effusion, possibly mildly improved. Postsurgical changes in the right upper hemithorax. Electronically Signed   By: Julian Hy M.D.   On: 10/29/2019 08:16   DG Chest Portable 1 View  Result Date: 10/24/2019 CLINICAL DATA:  Shortness of breath with fever and chills EXAM: PORTABLE CHEST 1 VIEW COMPARISON:  May 25, 2019 FINDINGS: There is a small right pleural effusion. There is postoperative change with scarring on the right. There is no edema or airspace opacity. Heart is upper normal in size with pulmonary vascularity normal. Patient is status post internal mammary bypass grafting. No adenopathy. No bone lesions. IMPRESSION: Small right pleural effusion. Scarring with volume loss on the right. No frank edema or airspace opacity. Stable cardiac silhouette. Postoperative changes noted. Electronically  Signed   By: Lowella Grip III M.D.   On: 10/24/2019 13:32   EEG adult  Result Date: 11/13/2019 Lora Havens, MD     11/13/2019  4:53 PM Patient Name: Emily Rocha MRN: [id number] Epilepsy Attending: Lora Havens Referring Physician/Provider: Dr. Shanon Brow Deny Chevez Date: 11/13/1019 Duration: 23.19 minutes Patient history: 64 year old female with subdural hematoma and syncope.  EEG evaluate for seizures. Level of alertness: Awake AEDs during EEG study: None Technical aspects: This EEG study was done with scalp electrodes positioned according to the 10-20 International system of electrode placement. Electrical activity was acquired at a sampling rate of 500Hz  and reviewed with a high frequency filter of 70Hz  and a low frequency filter of 1Hz . EEG data were recorded continuously and digitally stored. Description: The posterior dominant rhythm consists of 9 Hz activity of moderate voltage (25-35 uV) seen predominantly in posterior head regions, symmetric and reactive to eye opening and eye closing.  EEG showed intermittent generalized 3 to 6 Hz theta-delta slowing. Hyperventilation and photic stimulation were not performed.   ABNORMALITY -Intermittent slow, generalized IMPRESSION: This study is suggestive of mild diffuse encephalopathy, nonspecific etiology.  No seizures or epileptiform discharges were seen throughout the recording. Lora Havens   ECHOCARDIOGRAM COMPLETE  Result Date: 11/04/2019    ECHOCARDIOGRAM REPORT   Patient Name:   MAYUKHA SYMMONDS Date of Exam: 11/04/2019 Medical Rec #:  [id number]    Height:       65.0 in Accession #:    [id number]   Weight:       182.2 lb Date of Birth:  Feb 12, 1956    BSA:          1.901 m Patient Age:    23 years     BP:           119/52 mmHg Patient Gender: F            HR:           90 bpm. Exam Location:  Inpatient Procedure: 2D Echo, Cardiac Doppler and Color Doppler Indications:    Aortic Stenosis 424.1/135.0  History:        Patient has prior history of  Echocardiogram examinations, most                 recent 10/27/2019. CAD, Stroke and COPD; Signs/Symptoms:Shortness                 of Breath.  Sonographer:    Bernadene Person RDCS Referring Phys: [id number] [place]  1. Left ventricular ejection fraction, by estimation, is 60 to 65%. The left ventricle has normal function. The left ventricle has no regional wall motion abnormalities. Left ventricular diastolic parameters are consistent with Grade II diastolic dysfunction (pseudonormalization). Elevated left ventricular end-diastolic pressure.  2. Right ventricular systolic function is normal. The right ventricular size is normal. There is normal pulmonary artery systolic pressure.  3. Left atrial size was moderately dilated.  4. The mitral valve is degenerative. Mild mitral valve regurgitation. No evidence of mitral stenosis. Moderate mitral annular calcification.  5. The aortic valve was not well visualized. There is moderate calcification of the aortic valve. There is severe thickening of the aortic valve. Aortic valve regurgitation is mild. Moderate to severe aortic valve stenosis. Aortic valve area, by VTI measures 1.03 cm. Aortic valve mean gradient measures 37.0 mmHg. Aortic valve Vmax measures 3.93 m/s.  6. The inferior vena cava is normal in size with greater than 50% respiratory variability, suggesting right atrial pressure of 3 mmHg. FINDINGS  Left Ventricle: Left ventricular ejection fraction, by estimation, is 60 to 65%. The left ventricle has normal function. The left ventricle has no regional wall motion abnormalities. The left ventricular internal cavity size was normal in size. There is  no left ventricular hypertrophy. Left ventricular diastolic parameters are consistent with Grade II diastolic dysfunction (pseudonormalization). Elevated left ventricular end-diastolic pressure. Right Ventricle: The right ventricular size is normal. No increase in right ventricular wall thickness.  Right ventricular systolic function is normal. There is normal pulmonary artery systolic pressure. The tricuspid regurgitant velocity is 2.64 m/s, and  with an assumed right atrial pressure of 3 mmHg, the estimated right ventricular systolic pressure is 47.4 mmHg. Left Atrium: Left atrial size was moderately dilated. Right Atrium: Right atrial size was normal in size. Pericardium: There is no evidence of pericardial effusion. Mitral Valve: The mitral valve is degenerative in appearance. There is mild calcification of the anterior mitral valve leaflet(s). Moderate mitral annular calcification. Mild mitral valve regurgitation. No evidence of mitral valve stenosis. Tricuspid Valve: The tricuspid valve is normal in structure. Tricuspid valve regurgitation is mild . No evidence of tricuspid stenosis. Aortic Valve: The aortic valve was not well visualized. There is moderate calcification of the aortic valve. There is severe thickening of the aortic valve. Aortic valve regurgitation is mild. Moderate to severe aortic stenosis is present. Aortic valve mean gradient measures 37.0 mmHg. Aortic valve peak gradient measures 61.8 mmHg. Aortic valve area, by VTI measures 1.03 cm. Pulmonic Valve: The pulmonic valve was normal in structure. Pulmonic valve regurgitation is not visualized. No evidence of pulmonic stenosis. Aorta: The aortic root is normal in size and structure. Venous: The inferior vena cava is normal in size with greater than 50% respiratory variability, suggesting right atrial pressure of 3 mmHg. IAS/Shunts: No atrial level shunt detected by color flow Doppler.  LEFT VENTRICLE PLAX 2D LVIDd:         4.70 cm  Diastology LVIDs:         3.30 cm  LV e' medial:    6.40 cm/s LV PW:         0.90 cm  LV E/e' medial:  18.3 LV IVS:        0.80 cm  LV e' lateral:   10.30 cm/s LVOT diam:     2.10 cm  LV E/e' lateral: 11.4 LV SV:         82 LV SV Index:   43 LVOT Area:     3.46 cm  RIGHT VENTRICLE RV S prime:     8.55 cm/s  TAPSE (M-mode): 1.8 cm LEFT ATRIUM             Index       RIGHT ATRIUM           Index LA diam:        4.50 cm 2.37 cm/m  RA Area:     14.60 cm LA Vol (A2C):   75.9 ml 39.92 ml/m RA Volume:   33.10 ml  17.41 ml/m LA Vol (A4C):   84.4 ml 44.39 ml/m LA Biplane Vol: 79.9 ml 42.02 ml/m  AORTIC VALVE AV Area (Vmax):    0.93 cm AV Area (Vmean):   0.91 cm AV Area (VTI):     1.03 cm AV Vmax:           393.00 cm/s AV Vmean:          264.750 cm/s AV VTI:            0.800 m AV Peak Grad:      61.8 mmHg AV Mean Grad:      37.0 mmHg LVOT Vmax:         106.00 cm/s LVOT Vmean:        69.500 cm/s LVOT VTI:          0.238 m LVOT/AV VTI ratio: 0.30  AORTA Ao Root diam: 2.80 cm Ao Asc diam:  2.70 cm MITRAL VALVE                TRICUSPID VALVE MV Area (PHT): 2.99 cm     TR Peak grad:   27.9 mmHg MV Decel Time: 254 msec     TR Vmax:        264.00 cm/s MV E velocity: 117.00 cm/s MV A velocity: 112.00 cm/s  SHUNTS MV E/A ratio:  1.04         Systemic VTI:  0.24 m                             Systemic Diam: 2.10 cm Fransico Him MD Electronically signed by Fransico Him MD Signature Date/Time: 11/04/2019/3:04:04 PM    Final    CT IMAGE GUIDED DRAINAGE BY PERCUTANEOUS CATHETER  Result Date: 11/02/2019 INDICATION: Loculated right-sided pleural effusion. Please perform image guided chest tube placement for infection source control purposes. Note, there is attempt earlier this week to perform image guided chest tube placement however the patient could not tolerate the examination secondary to altered mental status. EXAM: LIMITED CHEST CT COMPARISON:  Chest CT-10/24/2019 MEDICATIONS: None ANESTHESIA/SEDATION: None CONTRAST:  None COMPLICATIONS: None immediate. PROCEDURE: Informed written consent was obtained from the patient after a discussion of the risks, benefits and alternatives to treatment. The patient was placed supine, slightly LPO on the CT gantry and a pre procedural CT was performed demonstrating significant reduction,  near complete resolution of residual trace right-sided pleural effusion with minimal amount of fluid seen at the level of the right lung apex and posteromedial aspect of the right lung base, too small to warrant CT-guided chest  tube placement. Above was discussed with referring cardiothoracic surgeon, Dr. Kipp Brood, the decision was made not to proceed with intervention at this time. IMPRESSION: Significant reduction/near resolution of residual trace right-sided effusion, too small to warrant CT-guided chest tube placement. No procedure performed. Electronically Signed   By: Sandi Mariscal M.D.   On: 11/02/2019 10:43   VAS [place] LOWER EXTREMITY VENOUS (DVT)  Result Date: 10/29/2019  Lower Venous DVTStudy Indications: Pulmonary embolism, and See Previous Cat Scan.  Risk Factors: Suspected PE. Limitations: Limited due to patient movement. Performing Technologist: Griffin Basil RCT RDMS  Examination Guidelines: A complete evaluation includes B-mode imaging, spectral Doppler, color Doppler, and power Doppler as needed of all accessible portions of each vessel. Bilateral testing is considered an integral part of a complete examination. Limited examinations for reoccurring indications may be performed as noted. The reflux portion of the exam is performed with the patient in reverse Trendelenburg.  +---------+---------------+---------+-----------+----------+--------------+ RIGHT    CompressibilityPhasicitySpontaneityPropertiesThrombus Aging +---------+---------------+---------+-----------+----------+--------------+ CFV      Full           Yes      Yes                                 +---------+---------------+---------+-----------+----------+--------------+ SFJ      Full                                                        +---------+---------------+---------+-----------+----------+--------------+ FV Prox  Full                                                         +---------+---------------+---------+-----------+----------+--------------+ FV Mid   Full                                                        +---------+---------------+---------+-----------+----------+--------------+ FV DistalFull                                                        +---------+---------------+---------+-----------+----------+--------------+ PFV      Full                                                        +---------+---------------+---------+-----------+----------+--------------+ POP      Full                    Yes                                 +---------+---------------+---------+-----------+----------+--------------+ PTV      Full                                                        +---------+---------------+---------+-----------+----------+--------------+  PERO     Full                                                        +---------+---------------+---------+-----------+----------+--------------+   +---------+---------------+---------+-----------+----------+------------------+ LEFT     CompressibilityPhasicitySpontaneityPropertiesThrombus Aging     +---------+---------------+---------+-----------+----------+------------------+ CFV      Full           Yes      Yes                                     +---------+---------------+---------+-----------+----------+------------------+ SFJ      Full                                                            +---------+---------------+---------+-----------+----------+------------------+ FV Prox  Full                                                            +---------+---------------+---------+-----------+----------+------------------+ FV Mid   Full                                                            +---------+---------------+---------+-----------+----------+------------------+ FV DistalFull                                                             +---------+---------------+---------+-----------+----------+------------------+ PFV      Full                                                            +---------+---------------+---------+-----------+----------+------------------+ POP      Full                                                            +---------+---------------+---------+-----------+----------+------------------+ PTV      Full                                                            +---------+---------------+---------+-----------+----------+------------------+ PERO  Full                                         Limited                                                                  visualization      +---------+---------------+---------+-----------+----------+------------------+     Summary: RIGHT: - There is no evidence of deep vein thrombosis in the lower extremity.  - No cystic structure found in the popliteal fossa.  LEFT: - There is no evidence of deep vein thrombosis in the lower extremity.  - No cystic structure found in the popliteal fossa.  *See table(s) above for measurements and observations. Electronically signed by Servando Snare MD on 10/29/2019 at 1:01:37 PM.    Final    ECHOCARDIOGRAM LIMITED  Result Date: 10/27/2019    ECHOCARDIOGRAM LIMITED REPORT   Patient Name:   Emily Rocha Date of Exam: 10/27/2019 Medical Rec #:  [id number]    Height:       65.0 in Accession #:    [id number]   Weight:       196.6 lb Date of Birth:  [birth year]    BSA:          1.964 m Patient Age:    48 years     BP:           148/66 mmHg Patient Gender: F            HR:           84 bpm. Exam Location:  Inpatient Procedure: Limited Echo, Limited Color Doppler and Cardiac Doppler Indications:     Dyspnea 786.09 / R06.00  History:         Patient has prior history of Echocardiogram examinations, most                  recent 08/10/2019. CAD; Prior CABG. Previous Myocardial                  Infarction, Prior                   CABG, COPD; Signs/Symptoms:Shortness of Breath. 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                  Parapneumonic right-sided effusion.  Sonographer:     Darlina Sicilian RDCS Referring Phys:  [id number] COURAGE EMOKPAE Diagnosing Phys: Fransico Him MD  Sonographer Comments: Image acquisition challenging due to patient behavioral factors. IMPRESSIONS  1. Left ventricular endocardial border not optimally defined to evaluate regional wall motion. Left ventricular diastolic function could not be evaluated.Patient was not cooperative with exam.  2. Right ventricular systolic function was not well visualized. The right ventricular size is not well visualized.  3. The mitral valve is normal in structure. Mild mitral valve regurgitation. No evidence of mitral stenosis. Moderate mitral annular calcification.  4. The aortic valve is calcified. There is severe calcifcation of the aortic valve. There is moderate thickening of the aortic valve.  Aortic valve regurgitation is not visualized. There is likely severe AS but study not sufficient for adequate assessment.  5. Recommend repeat 2D echo full study once patient able to cooperate. FINDINGS  Left Ventricle: Left ventricular endocardial border not optimally defined to evaluate regional wall motion. The left ventricular internal cavity size was normal in size. There is no left ventricular hypertrophy. Left ventricular diastolic function could  not be evaluated. Right Ventricle: The right ventricular size is not well visualized. Right vetricular wall thickness was not assessed. Right ventricular systolic function was not well visualized. Left Atrium: Left atrial size was not well visualized. Right Atrium: Right atrial size was not well visualized. Pericardium: There is no evidence of pericardial effusion. Mitral Valve: The mitral valve is normal in structure. Moderate mitral annular calcification. Mild mitral valve regurgitation.  No evidence of mitral valve stenosis. Tricuspid Valve: The tricuspid valve is normal in structure. Tricuspid valve regurgitation is mild . No evidence of tricuspid stenosis. Aortic Valve: The aortic valve is calcified. There is severe calcifcation of the aortic valve. There is moderate thickening of the aortic valve. Aortic valve regurgitation is not visualized. There is likely severe AS but study not sufficient for adequate  assessment. Pulmonic Valve: The pulmonic valve was not assessed. Aorta: The aortic root is normal in size and structure. IAS/Shunts: No atrial level shunt detected by color flow Doppler. LEFT VENTRICLE PLAX 2D LVOT diam:     1.90 cm LVOT Area:     2.84 cm   AORTA Ao Root diam: 2.80 cm  SHUNTS Systemic Diam: 1.90 cm Fransico Him MD Electronically signed by Fransico Him MD Signature Date/Time: 10/27/2019/2:08:25 PM    Final (Updated)    [place] EKG SITE RITE  Result Date: 10/28/2019 If Site Rite image not attached, placement could not be confirmed due to current cardiac rhythm.  CT Maxillofacial WO CM  Result Date: 11/12/2019 CLINICAL DATA:  Poly trauma, critical, head/cervical spine injury suspected. Facial trauma. Additional history provided: Fall, laceration to bottom lip, multiple bruises, patient also reports falling yesterday. EXAM: CT HEAD WITHOUT CONTRAST CT MAXILLOFACIAL WITHOUT CONTRAST CT CERVICAL SPINE WITHOUT CONTRAST TECHNIQUE: Multidetector CT imaging of the head, cervical spine, and maxillofacial structures were performed using the standard protocol without intravenous contrast. Multiplanar CT image reconstructions of the cervical spine and maxillofacial structures were also generated. COMPARISON:  Head CT 10/25/2019. FINDINGS: CT HEAD FINDINGS Brain: Stable, mild generalized cerebral atrophy. Stable, mild patchy hypoattenuation within the cerebral white matter which is nonspecific, but consistent with chronic small vessel ischemic disease. There is acute parafalcine subdural  hemorrhage measuring up to 4 mm (for instance as seen on series 2, image 26) (series 4, image 35). Trace acute subdural hemorrhage is also questioned along the left tentorium (series 2, images 11 and 12). No demarcated cortical infarct is identified. No evidence of intracranial mass. No midline shift. Vascular: No hyperdense vessel. Skull: Normal. Negative for fracture or focal lesion. Other: Bilateral mastoid effusions. CT MAXILLOFACIAL FINDINGS Osseous: Motion degradation limits evaluation of the mandibular body. No acute maxillofacial fracture is identified. Orbits: No acute finding. The globes are normal in size and contour. The extraocular muscles and optic nerve sheath complexes are symmetric and unremarkable. Sinuses: Mild ethmoid and right sphenoid sinus mucosal thickening. Soft tissues: Midline forehead and right maxillofacial soft tissue swelling/hematoma. Incidentally noted anterior nasal septal defect (series 8, image 19). CT CERVICAL SPINE FINDINGS Alignment: A mild cervical levocurvature may be positional. Straightening of the expected cervical lordosis. Trace C5-C6 grade  1 retrolisthesis Skull base and vertebrae: The basion-dental and atlanto-dental intervals are maintained.No evidence of acute fracture to the cervical spine. Soft tissues and spinal canal: No prevertebral fluid or swelling. No visible canal hematoma. Disc levels: Cervical spondylosis with multilevel disc space narrowing, posterior disc osteophytes, uncovertebral and facet hypertrophy. These findings are greatest at C5-C6, C6-C7 and C7-T1. Upper chest: No consolidation within the imaged lung apices. No visible pneumothorax. These results were called by telephone at the time of interpretation on 11/12/2019 at 6:16 pm to provider Carmin Muskrat , who verbally acknowledged these results. IMPRESSION: CT head: 1. Acute parafalcine subdural hemorrhage measuring up to 4 mm. Trace acute subdural hemorrhage is also questioned along the left  tentorium. 2. Stable mild generalized cerebral atrophy and chronic small vessel ischemic disease. 3. Bilateral mastoid effusions. CT maxillofacial 1. Evaluation of the mandibular body is limited due to motion degradation. 2. No acute maxillofacial fracture is identified. 3. Midline forehead and right maxillofacial soft tissue swelling/hematoma. 4. Mild ethmoid and right sphenoid sinus mucosal thickening. 5. Incidentally noted anterior nasal septal defect. CT cervical spine: 1. No evidence of acute fracture to the cervical spine. 2. Trace C5-C6 grade 1 retrolisthesis. 3. Cervical spondylosis as described and greatest at C5-C6, C6-C7 and C7-T1. Electronically Signed   By: Kellie Simmering DO   On: 11/12/2019 18:17         Discharge Exam: Vitals:   11/13/19 1440 11/13/19 1521  BP:  103/72  Pulse:  (!) 114  Resp:  (!) 22  Temp:    SpO2: 95% 98%   Vitals:   11/13/19 1330 11/13/19 1400 11/13/19 1440 11/13/19 1521  BP: 126/88 108/62  103/72  Pulse: (!) 112 100  (!) 114  Resp: (!) 21 (!) 22  (!) 22  Temp:      TempSrc:      SpO2: (!) 79% 98% 95% 98%  Weight:      Height:        General: Pt is alert, awake, not in acute distress Cardiovascular: RRR, S1/S2 +, no rubs, no gallops Respiratory: CTA bilaterally, no wheezing, no rhonchi Abdominal: Soft, NT, ND, bowel sounds + Extremities: no edema, no cyanosis   The results of significant diagnostics from this hospitalization (including imaging, microbiology, ancillary and laboratory) are listed below for reference.    Significant Diagnostic Studies: DG Chest 2 View  Result Date: 11/12/2019 CLINICAL DATA:  Status post fall. EXAM: CHEST - 2 VIEW COMPARISON:  November 02, 2019 FINDINGS: Multiple sternal wires are seen. Mild, diffuse, chronic appearing increased interstitial lung markings are noted. Radiopaque surgical clips are seen along the medial aspect of the right apex. Ill-defined surgical sutures are seen overlying the right lung base.  Mild right basilar atelectasis is also present. There is very mild blunting of the right costophrenic angle. No pneumothorax is seen. The heart size and mediastinal contours are within normal limits. There is mild calcification of the aortic arch. Surgical clips are seen overlying the right upper quadrant. The visualized skeletal structures are unremarkable. IMPRESSION: 1. Evidence of prior median sternotomy/CABG. 2. Postoperative changes within the right lung base with mild right basilar atelectasis. 3. Very small right pleural effusion. Electronically Signed   By: Virgina Norfolk M.D.   On: 11/12/2019 18:08   CT HEAD WO CONTRAST  Result Date: 11/13/2019 CLINICAL DATA:  64 year old female found down. Small volume intracranial hemorrhage. Multiple recent falls. EXAM: CT HEAD WITHOUT CONTRAST TECHNIQUE: Contiguous axial images were obtained from the base of the  skull through the vertex without intravenous contrast. COMPARISON:  Head CT 11/12/2019. FINDINGS: Brain: Previous feel trace para falcine subdural blood is stable on series 2, images 23, 26. No associated mass effect. No intraventricular blood identified. No other extra-axial blood identified. Basilar cisterns are patent. No midline shift, mass effect, or evidence of intracranial mass lesion. No ventriculomegaly. Patchy bilateral white matter hypodensity appears stable. No cortically based acute infarct identified. Vascular: Calcified atherosclerosis at the skull base. Skull: Stable.  No skull fracture identified. Sinuses/Orbits: Paranasal sinuses are stable and well pneumatized. Mild bilateral mastoid effusions redemonstrated, and new since 10/25/2019. tympanic cavities appear to remain clear. Other: Mild right forehead scalp contusion or hematoma. Partially visible right lateral face contusion or hematoma on series 3, image 1. Orbits soft tissues remain within normal limits. Other scalp soft tissues remain normal. IMPRESSION: 1. Stable trace  parafalcine subdural blood since yesterday. No mass effect. 2. No new intracranial abnormality. No skull fracture identified. 3. Mild bilateral mastoid effusions, new from earlier this month. Electronically Signed   By: Genevie Ann M.D.   On: 11/13/2019 09:04   CT Head Wo Contrast  Result Date: 11/12/2019 CLINICAL DATA:  Poly trauma, critical, head/cervical spine injury suspected. Facial trauma. Additional history provided: Fall, laceration to bottom lip, multiple bruises, patient also reports falling yesterday. EXAM: CT HEAD WITHOUT CONTRAST CT MAXILLOFACIAL WITHOUT CONTRAST CT CERVICAL SPINE WITHOUT CONTRAST TECHNIQUE: Multidetector CT imaging of the head, cervical spine, and maxillofacial structures were performed using the standard protocol without intravenous contrast. Multiplanar CT image reconstructions of the cervical spine and maxillofacial structures were also generated. COMPARISON:  Head CT 10/25/2019. FINDINGS: CT HEAD FINDINGS Brain: Stable, mild generalized cerebral atrophy. Stable, mild patchy hypoattenuation within the cerebral white matter which is nonspecific, but consistent with chronic small vessel ischemic disease. There is acute parafalcine subdural hemorrhage measuring up to 4 mm (for instance as seen on series 2, image 26) (series 4, image 35). Trace acute subdural hemorrhage is also questioned along the left tentorium (series 2, images 11 and 12). No demarcated cortical infarct is identified. No evidence of intracranial mass. No midline shift. Vascular: No hyperdense vessel. Skull: Normal. Negative for fracture or focal lesion. Other: Bilateral mastoid effusions. CT MAXILLOFACIAL FINDINGS Osseous: Motion degradation limits evaluation of the mandibular body. No acute maxillofacial fracture is identified. Orbits: No acute finding. The globes are normal in size and contour. The extraocular muscles and optic nerve sheath complexes are symmetric and unremarkable. Sinuses: Mild ethmoid and right  sphenoid sinus mucosal thickening. Soft tissues: Midline forehead and right maxillofacial soft tissue swelling/hematoma. Incidentally noted anterior nasal septal defect (series 8, image 19). CT CERVICAL SPINE FINDINGS Alignment: A mild cervical levocurvature may be positional. Straightening of the expected cervical lordosis. Trace C5-C6 grade 1 retrolisthesis Skull base and vertebrae: The basion-dental and atlanto-dental intervals are maintained.No evidence of acute fracture to the cervical spine. Soft tissues and spinal canal: No prevertebral fluid or swelling. No visible canal hematoma. Disc levels: Cervical spondylosis with multilevel disc space narrowing, posterior disc osteophytes, uncovertebral and facet hypertrophy. These findings are greatest at C5-C6, C6-C7 and C7-T1. Upper chest: No consolidation within the imaged lung apices. No visible pneumothorax. These results were called by telephone at the time of interpretation on 11/12/2019 at 6:16 pm to provider Carmin Muskrat , who verbally acknowledged these results. IMPRESSION: CT head: 1. Acute parafalcine subdural hemorrhage measuring up to 4 mm. Trace acute subdural hemorrhage is also questioned along the left tentorium. 2. Stable mild generalized  cerebral atrophy and chronic small vessel ischemic disease. 3. Bilateral mastoid effusions. CT maxillofacial 1. Evaluation of the mandibular body is limited due to motion degradation. 2. No acute maxillofacial fracture is identified. 3. Midline forehead and right maxillofacial soft tissue swelling/hematoma. 4. Mild ethmoid and right sphenoid sinus mucosal thickening. 5. Incidentally noted anterior nasal septal defect. CT cervical spine: 1. No evidence of acute fracture to the cervical spine. 2. Trace C5-C6 grade 1 retrolisthesis. 3. Cervical spondylosis as described and greatest at C5-C6, C6-C7 and C7-T1. Electronically Signed   By: Kellie Simmering DO   On: 11/12/2019 18:17   CT HEAD WO CONTRAST  Result Date:  10/25/2019 CLINICAL DATA:  64 year old female with hypoxia and altered mental status. EXAM: CT HEAD WITHOUT CONTRAST TECHNIQUE: Contiguous axial images were obtained from the base of the skull through the vertex without intravenous contrast. COMPARISON:  Head CT 09/28/2017. FINDINGS: Brain: No midline shift, ventriculomegaly, mass effect, evidence of mass lesion, intracranial hemorrhage or evidence of cortically based acute infarction. Scattered, patchy bilateral cerebral white matter hypodensity appears not significantly changed. Otherwise gray-white matter differentiation remains within normal limits. Vascular: Calcified atherosclerosis at the skull base. Skull: No acute osseous abnormality identified. Sinuses/Orbits: Visualized paranasal sinuses and mastoids are stable and well pneumatized. Other: Visualized orbits and scalp soft tissues are within normal limits. IMPRESSION: 1. No acute intracranial abnormality by CT. 2. Patchy chronic white matter changes, most commonly due to chronic small vessel disease. Electronically Signed   By: Genevie Ann M.D.   On: 10/25/2019 16:10   CT Angio Chest PE W and/or Wo Contrast  Result Date: 10/24/2019 CLINICAL DATA:  Chest pain. EXAM: CT ANGIOGRAPHY CHEST WITH CONTRAST TECHNIQUE: Multidetector CT imaging of the chest was performed using the standard protocol during bolus administration of intravenous contrast. Multiplanar CT image reconstructions and MIPs were obtained to evaluate the vascular anatomy. CONTRAST:  159mL OMNIPAQUE IOHEXOL 350 MG/ML SOLN COMPARISON:  February 27, 2019. FINDINGS: Cardiovascular: Atherosclerosis of thoracic aorta is noted without aneurysm or dissection. Status post coronary bypass graft. The patient is status post right upper lobectomy. There is the interval development of thrombus within the most distal portion of the stump of the right upper lobe branch of the right pulmonary artery. No definite embolus is noted. Mild cardiomegaly is noted. No  pericardial effusion is noted. Mediastinum/Nodes: Moderate size sliding-type hiatal hernia is noted. Thyroid gland is unremarkable. No adenopathy is noted. Lungs/Pleura: No pneumothorax is noted. Left lung is clear. Moderate size loculated right pleural effusion is noted with associated subsegmental atelectasis of the right lower lobe. Status post right upper lobectomy as described above. Upper Abdomen: No acute abnormality. Musculoskeletal: No chest wall abnormality. No acute or significant osseous findings. Review of the MIP images confirms the above findings. IMPRESSION: 1. Status post right upper lobectomy. Interval development of thrombus within the most distal portion of the residual stump of the right upper lobe branch of the right pulmonary artery. No definite pulmonary embolus is noted. 2. Moderate size loculated right pleural effusion is noted with associated subsegmental atelectasis of the right lower lobe. 3. Moderate size sliding-type hiatal hernia. 4. Aortic atherosclerosis. Aortic Atherosclerosis (ICD10-I70.0). Electronically Signed   By: Marijo Conception M.D.   On: 10/24/2019 15:47   CT Cervical Spine Wo Contrast  Result Date: 11/12/2019 CLINICAL DATA:  Poly trauma, critical, head/cervical spine injury suspected. Facial trauma. Additional history provided: Fall, laceration to bottom lip, multiple bruises, patient also reports falling yesterday. EXAM: CT HEAD WITHOUT CONTRAST  CT MAXILLOFACIAL WITHOUT CONTRAST CT CERVICAL SPINE WITHOUT CONTRAST TECHNIQUE: Multidetector CT imaging of the head, cervical spine, and maxillofacial structures were performed using the standard protocol without intravenous contrast. Multiplanar CT image reconstructions of the cervical spine and maxillofacial structures were also generated. COMPARISON:  Head CT 10/25/2019. FINDINGS: CT HEAD FINDINGS Brain: Stable, mild generalized cerebral atrophy. Stable, mild patchy hypoattenuation within the cerebral white matter which is  nonspecific, but consistent with chronic small vessel ischemic disease. There is acute parafalcine subdural hemorrhage measuring up to 4 mm (for instance as seen on series 2, image 26) (series 4, image 35). Trace acute subdural hemorrhage is also questioned along the left tentorium (series 2, images 11 and 12). No demarcated cortical infarct is identified. No evidence of intracranial mass. No midline shift. Vascular: No hyperdense vessel. Skull: Normal. Negative for fracture or focal lesion. Other: Bilateral mastoid effusions. CT MAXILLOFACIAL FINDINGS Osseous: Motion degradation limits evaluation of the mandibular body. No acute maxillofacial fracture is identified. Orbits: No acute finding. The globes are normal in size and contour. The extraocular muscles and optic nerve sheath complexes are symmetric and unremarkable. Sinuses: Mild ethmoid and right sphenoid sinus mucosal thickening. Soft tissues: Midline forehead and right maxillofacial soft tissue swelling/hematoma. Incidentally noted anterior nasal septal defect (series 8, image 19). CT CERVICAL SPINE FINDINGS Alignment: A mild cervical levocurvature may be positional. Straightening of the expected cervical lordosis. Trace C5-C6 grade 1 retrolisthesis Skull base and vertebrae: The basion-dental and atlanto-dental intervals are maintained.No evidence of acute fracture to the cervical spine. Soft tissues and spinal canal: No prevertebral fluid or swelling. No visible canal hematoma. Disc levels: Cervical spondylosis with multilevel disc space narrowing, posterior disc osteophytes, uncovertebral and facet hypertrophy. These findings are greatest at C5-C6, C6-C7 and C7-T1. Upper chest: No consolidation within the imaged lung apices. No visible pneumothorax. These results were called by telephone at the time of interpretation on 11/12/2019 at 6:16 pm to provider Carmin Muskrat , who verbally acknowledged these results. IMPRESSION: CT head: 1. Acute parafalcine  subdural hemorrhage measuring up to 4 mm. Trace acute subdural hemorrhage is also questioned along the left tentorium. 2. Stable mild generalized cerebral atrophy and chronic small vessel ischemic disease. 3. Bilateral mastoid effusions. CT maxillofacial 1. Evaluation of the mandibular body is limited due to motion degradation. 2. No acute maxillofacial fracture is identified. 3. Midline forehead and right maxillofacial soft tissue swelling/hematoma. 4. Mild ethmoid and right sphenoid sinus mucosal thickening. 5. Incidentally noted anterior nasal septal defect. CT cervical spine: 1. No evidence of acute fracture to the cervical spine. 2. Trace C5-C6 grade 1 retrolisthesis. 3. Cervical spondylosis as described and greatest at C5-C6, C6-C7 and C7-T1. Electronically Signed   By: Kellie Simmering DO   On: 11/12/2019 18:17   [place] CHEST (PLEURAL EFFUSION)  Result Date: 10/25/2019 CLINICAL DATA:  RIGHT pleural effusion by CT, history of COPD, GERD, RIGHT upper lobe lung cancer post upper lobectomy EXAM: CHEST ULTRASOUND COMPARISON:  CT angio chest 10/24/2019 FINDINGS: Small pleural effusion identified at the RIGHT lung base. Effusion is partially loculated. Atelectatic RIGHT lower lobe is adherent to the posterior chest wall and prevents safe access of the inferior thoracic cavity for thoracentesis. This appearance was seen on the prior CT but appears fixed with change in position on ultrasound. IMPRESSION: Small loculated RIGHT pleural effusion, unable to safely access for thoracentesis. Electronically Signed   By: Lavonia Dana M.D.   On: 10/25/2019 11:00   CT CHEST LIMITED WO CONTRAST  Result  Date: 11/02/2019 INDICATION: Loculated right-sided pleural effusion. Please perform image guided chest tube placement for infection source control purposes. Note, there is attempt earlier this week to perform image guided chest tube placement however the patient could not tolerate the examination secondary to altered mental status.  EXAM: LIMITED CHEST CT COMPARISON:  Chest CT-10/24/2019 MEDICATIONS: None ANESTHESIA/SEDATION: None CONTRAST:  None COMPLICATIONS: None immediate. PROCEDURE: Informed written consent was obtained from the patient after a discussion of the risks, benefits and alternatives to treatment. The patient was placed supine, slightly LPO on the CT gantry and a pre procedural CT was performed demonstrating significant reduction, near complete resolution of residual trace right-sided pleural effusion with minimal amount of fluid seen at the level of the right lung apex and posteromedial aspect of the right lung base, too small to warrant CT-guided chest tube placement. Above was discussed with referring cardiothoracic surgeon, Dr. Kipp Brood, the decision was made not to proceed with intervention at this time. IMPRESSION: Significant reduction/near resolution of residual trace right-sided effusion, too small to warrant CT-guided chest tube placement. No procedure performed. Electronically Signed   By: Sandi Mariscal M.D.   On: 11/02/2019 10:43   DG CHEST PORT 1 VIEW  Result Date: 11/02/2019 CLINICAL DATA:  Pleural effusion EXAM: PORTABLE CHEST 1 VIEW COMPARISON:  10/31/2019 FINDINGS: Prior median sternotomy and CABG. Heart is normal size. Bibasilar airspace opacities, right greater than left with some improvement on the right since prior study. No effusions or pneumothorax. IMPRESSION: Bilateral lower lobe airspace opacities, right greater than left with some improvement in aeration at the right base since prior study. Electronically Signed   By: Rolm Baptise M.D.   On: 11/02/2019 11:22   DG CHEST PORT 1 VIEW  Result Date: 10/31/2019 CLINICAL DATA:  Shortness of breath EXAM: PORTABLE CHEST 1 VIEW COMPARISON:  October 29, 2019 chest radiograph and chest CT October 24, 2019 FINDINGS: There is a persistent fairly small right pleural effusion. There is ill-defined airspace opacity in the right base with apparent  postoperative change in this area. Elsewhere, there is mild interstitial edema. There is cardiomegaly with pulmonary venous hypertension. Patient is status post internal mammary bypass grafting. No adenopathy. There is calcification in the aortic arch as well as in the left carotid artery. IMPRESSION: Cardiomegaly with pulmonary vascular congestion. Interstitial pulmonary edema with right pleural effusion. These are findings felt to be indicative of a degree of underlying congestive heart failure. There is superimposed airspace opacity in the right base which may represent alveolar edema or a degree of superimposed pneumonia. Postoperative changes noted. There is left carotid artery calcification. Aortic Atherosclerosis (ICD10-I70.0). Electronically Signed   By: Lowella Grip III M.D.   On: 10/31/2019 08:11   DG CHEST PORT 1 VIEW  Result Date: 10/29/2019 CLINICAL DATA:  Shortness of breath, respiratory failure, pleural effusion EXAM: PORTABLE CHEST 1 VIEW COMPARISON:  CTA chest dated 10/24/2019 FINDINGS: Small to moderate right pleural effusion, possibly mildly improved. Associated right lower lobe opacity, likely atelectasis. Postsurgical changes in the right upper hemithorax. Left lung is clear. No frank interstitial edema. Cardiomegaly. Postsurgical changes related to prior CABG. Median sternotomy. IMPRESSION: Small to moderate right pleural effusion, possibly mildly improved. Postsurgical changes in the right upper hemithorax. Electronically Signed   By: Julian Hy M.D.   On: 10/29/2019 08:16   DG Chest Portable 1 View  Result Date: 10/24/2019 CLINICAL DATA:  Shortness of breath with fever and chills EXAM: PORTABLE CHEST 1 VIEW COMPARISON:  May 25, 2019  FINDINGS: There is a small right pleural effusion. There is postoperative change with scarring on the right. There is no edema or airspace opacity. Heart is upper normal in size with pulmonary vascularity normal. Patient is status post internal  mammary bypass grafting. No adenopathy. No bone lesions. IMPRESSION: Small right pleural effusion. Scarring with volume loss on the right. No frank edema or airspace opacity. Stable cardiac silhouette. Postoperative changes noted. Electronically Signed   By: Lowella Grip III M.D.   On: 10/24/2019 13:32   EEG adult  Result Date: 11/13/2019 Lora Havens, MD     11/13/2019  4:53 PM Patient Name: Emily Rocha MRN: [id number] Epilepsy Attending: Lora Havens Referring Physician/Provider: Dr. Shanon Brow Lineth Thielke Date: 11/13/1019 Duration: 23.19 minutes Patient history: 64 year old female with subdural hematoma and syncope.  EEG evaluate for seizures. Level of alertness: Awake AEDs during EEG study: None Technical aspects: This EEG study was done with scalp electrodes positioned according to the 10-20 International system of electrode placement. Electrical activity was acquired at a sampling rate of 500Hz  and reviewed with a high frequency filter of 70Hz  and a low frequency filter of 1Hz . EEG data were recorded continuously and digitally stored. Description: The posterior dominant rhythm consists of 9 Hz activity of moderate voltage (25-35 uV) seen predominantly in posterior head regions, symmetric and reactive to eye opening and eye closing.  EEG showed intermittent generalized 3 to 6 Hz theta-delta slowing. Hyperventilation and photic stimulation were not performed.   ABNORMALITY -Intermittent slow, generalized IMPRESSION: This study is suggestive of mild diffuse encephalopathy, nonspecific etiology.  No seizures or epileptiform discharges were seen throughout the recording. Lora Havens   ECHOCARDIOGRAM COMPLETE  Result Date: 11/04/2019    ECHOCARDIOGRAM REPORT   Patient Name:   Emily Rocha Date of Exam: 11/04/2019 Medical Rec #:  [id number]    Height:       65.0 in Accession #:    [id number]   Weight:       182.2 lb Date of Birth:  Apr 18, 1955    BSA:          1.901 m Patient Age:    68 years     BP:            119/52 mmHg Patient Gender: F            HR:           90 bpm. Exam Location:  Inpatient Procedure: 2D Echo, Cardiac Doppler and Color Doppler Indications:    Aortic Stenosis 424.1/135.0  History:        Patient has prior history of Echocardiogram examinations, most                 recent 10/27/2019. CAD, Stroke and COPD; Signs/Symptoms:Shortness                 of Breath.  Sonographer:    Bernadene Person RDCS Referring Phys: [id number] [place]  1. Left ventricular ejection fraction, by estimation, is 60 to 65%. The left ventricle has normal function. The left ventricle has no regional wall motion abnormalities. Left ventricular diastolic parameters are consistent with Grade II diastolic dysfunction (pseudonormalization). Elevated left ventricular end-diastolic pressure.  2. Right ventricular systolic function is normal. The right ventricular size is normal. There is normal pulmonary artery systolic pressure.  3. Left atrial size was moderately dilated.  4. The mitral valve is degenerative. Mild mitral valve regurgitation. No evidence of mitral stenosis. Moderate mitral  annular calcification.  5. The aortic valve was not well visualized. There is moderate calcification of the aortic valve. There is severe thickening of the aortic valve. Aortic valve regurgitation is mild. Moderate to severe aortic valve stenosis. Aortic valve area, by VTI measures 1.03 cm. Aortic valve mean gradient measures 37.0 mmHg. Aortic valve Vmax measures 3.93 m/s.  6. The inferior vena cava is normal in size with greater than 50% respiratory variability, suggesting right atrial pressure of 3 mmHg. FINDINGS  Left Ventricle: Left ventricular ejection fraction, by estimation, is 60 to 65%. The left ventricle has normal function. The left ventricle has no regional wall motion abnormalities. The left ventricular internal cavity size was normal in size. There is  no left ventricular hypertrophy. Left ventricular diastolic  parameters are consistent with Grade II diastolic dysfunction (pseudonormalization). Elevated left ventricular end-diastolic pressure. Right Ventricle: The right ventricular size is normal. No increase in right ventricular wall thickness. Right ventricular systolic function is normal. There is normal pulmonary artery systolic pressure. The tricuspid regurgitant velocity is 2.64 m/s, and  with an assumed right atrial pressure of 3 mmHg, the estimated right ventricular systolic pressure is 31.5 mmHg. Left Atrium: Left atrial size was moderately dilated. Right Atrium: Right atrial size was normal in size. Pericardium: There is no evidence of pericardial effusion. Mitral Valve: The mitral valve is degenerative in appearance. There is mild calcification of the anterior mitral valve leaflet(s). Moderate mitral annular calcification. Mild mitral valve regurgitation. No evidence of mitral valve stenosis. Tricuspid Valve: The tricuspid valve is normal in structure. Tricuspid valve regurgitation is mild . No evidence of tricuspid stenosis. Aortic Valve: The aortic valve was not well visualized. There is moderate calcification of the aortic valve. There is severe thickening of the aortic valve. Aortic valve regurgitation is mild. Moderate to severe aortic stenosis is present. Aortic valve mean gradient measures 37.0 mmHg. Aortic valve peak gradient measures 61.8 mmHg. Aortic valve area, by VTI measures 1.03 cm. Pulmonic Valve: The pulmonic valve was normal in structure. Pulmonic valve regurgitation is not visualized. No evidence of pulmonic stenosis. Aorta: The aortic root is normal in size and structure. Venous: The inferior vena cava is normal in size with greater than 50% respiratory variability, suggesting right atrial pressure of 3 mmHg. IAS/Shunts: No atrial level shunt detected by color flow Doppler.  LEFT VENTRICLE PLAX 2D LVIDd:         4.70 cm  Diastology LVIDs:         3.30 cm  LV e' medial:    6.40 cm/s LV PW:          0.90 cm  LV E/e' medial:  18.3 LV IVS:        0.80 cm  LV e' lateral:   10.30 cm/s LVOT diam:     2.10 cm  LV E/e' lateral: 11.4 LV SV:         82 LV SV Index:   43 LVOT Area:     3.46 cm  RIGHT VENTRICLE RV S prime:     8.55 cm/s TAPSE (M-mode): 1.8 cm LEFT ATRIUM             Index       RIGHT ATRIUM           Index LA diam:        4.50 cm 2.37 cm/m  RA Area:     14.60 cm LA Vol (A2C):   75.9 ml 39.92 ml/m RA Volume:   33.10  ml  17.41 ml/m LA Vol (A4C):   84.4 ml 44.39 ml/m LA Biplane Vol: 79.9 ml 42.02 ml/m  AORTIC VALVE AV Area (Vmax):    0.93 cm AV Area (Vmean):   0.91 cm AV Area (VTI):     1.03 cm AV Vmax:           393.00 cm/s AV Vmean:          264.750 cm/s AV VTI:            0.800 m AV Peak Grad:      61.8 mmHg AV Mean Grad:      37.0 mmHg LVOT Vmax:         106.00 cm/s LVOT Vmean:        69.500 cm/s LVOT VTI:          0.238 m LVOT/AV VTI ratio: 0.30  AORTA Ao Root diam: 2.80 cm Ao Asc diam:  2.70 cm MITRAL VALVE                TRICUSPID VALVE MV Area (PHT): 2.99 cm     TR Peak grad:   27.9 mmHg MV Decel Time: 254 msec     TR Vmax:        264.00 cm/s MV E velocity: 117.00 cm/s MV A velocity: 112.00 cm/s  SHUNTS MV E/A ratio:  1.04         Systemic VTI:  0.24 m                             Systemic Diam: 2.10 cm Fransico Him MD Electronically signed by Fransico Him MD Signature Date/Time: 11/04/2019/3:04:04 PM    Final    CT IMAGE GUIDED DRAINAGE BY PERCUTANEOUS CATHETER  Result Date: 11/02/2019 INDICATION: Loculated right-sided pleural effusion. Please perform image guided chest tube placement for infection source control purposes. Note, there is attempt earlier this week to perform image guided chest tube placement however the patient could not tolerate the examination secondary to altered mental status. EXAM: LIMITED CHEST CT COMPARISON:  Chest CT-10/24/2019 MEDICATIONS: None ANESTHESIA/SEDATION: None CONTRAST:  None COMPLICATIONS: None immediate. PROCEDURE: Informed written consent was  obtained from the patient after a discussion of the risks, benefits and alternatives to treatment. The patient was placed supine, slightly LPO on the CT gantry and a pre procedural CT was performed demonstrating significant reduction, near complete resolution of residual trace right-sided pleural effusion with minimal amount of fluid seen at the level of the right lung apex and posteromedial aspect of the right lung base, too small to warrant CT-guided chest tube placement. Above was discussed with referring cardiothoracic surgeon, Dr. Kipp Brood, the decision was made not to proceed with intervention at this time. IMPRESSION: Significant reduction/near resolution of residual trace right-sided effusion, too small to warrant CT-guided chest tube placement. No procedure performed. Electronically Signed   By: Sandi Mariscal M.D.   On: 11/02/2019 10:43   VAS [place] LOWER EXTREMITY VENOUS (DVT)  Result Date: 10/29/2019  Lower Venous DVTStudy Indications: Pulmonary embolism, and See Previous Cat Scan.  Risk Factors: Suspected PE. Limitations: Limited due to patient movement. Performing Technologist: Griffin Basil RCT RDMS  Examination Guidelines: A complete evaluation includes B-mode imaging, spectral Doppler, color Doppler, and power Doppler as needed of all accessible portions of each vessel. Bilateral testing is considered an integral part of a complete examination. Limited examinations for reoccurring indications may be performed as noted. The reflux portion of the  exam is performed with the patient in reverse Trendelenburg.  +---------+---------------+---------+-----------+----------+--------------+ RIGHT    CompressibilityPhasicitySpontaneityPropertiesThrombus Aging +---------+---------------+---------+-----------+----------+--------------+ CFV      Full           Yes      Yes                                 +---------+---------------+---------+-----------+----------+--------------+ SFJ      Full                                                         +---------+---------------+---------+-----------+----------+--------------+ FV Prox  Full                                                        +---------+---------------+---------+-----------+----------+--------------+ FV Mid   Full                                                        +---------+---------------+---------+-----------+----------+--------------+ FV DistalFull                                                        +---------+---------------+---------+-----------+----------+--------------+ PFV      Full                                                        +---------+---------------+---------+-----------+----------+--------------+ POP      Full                    Yes                                 +---------+---------------+---------+-----------+----------+--------------+ PTV      Full                                                        +---------+---------------+---------+-----------+----------+--------------+ PERO     Full                                                        +---------+---------------+---------+-----------+----------+--------------+   +---------+---------------+---------+-----------+----------+------------------+ LEFT     CompressibilityPhasicitySpontaneityPropertiesThrombus Aging     +---------+---------------+---------+-----------+----------+------------------+ CFV      Full           Yes  Yes                                     +---------+---------------+---------+-----------+----------+------------------+ SFJ      Full                                                            +---------+---------------+---------+-----------+----------+------------------+ FV Prox  Full                                                            +---------+---------------+---------+-----------+----------+------------------+ FV Mid   Full                                                             +---------+---------------+---------+-----------+----------+------------------+ FV DistalFull                                                            +---------+---------------+---------+-----------+----------+------------------+ PFV      Full                                                            +---------+---------------+---------+-----------+----------+------------------+ POP      Full                                                            +---------+---------------+---------+-----------+----------+------------------+ PTV      Full                                                            +---------+---------------+---------+-----------+----------+------------------+ PERO     Full                                         Limited  visualization      +---------+---------------+---------+-----------+----------+------------------+     Summary: RIGHT: - There is no evidence of deep vein thrombosis in the lower extremity.  - No cystic structure found in the popliteal fossa.  LEFT: - There is no evidence of deep vein thrombosis in the lower extremity.  - No cystic structure found in the popliteal fossa.  *See table(s) above for measurements and observations. Electronically signed by Servando Snare MD on 10/29/2019 at 1:01:37 PM.    Final    ECHOCARDIOGRAM LIMITED  Result Date: 10/27/2019    ECHOCARDIOGRAM LIMITED REPORT   Patient Name:   Emily Rocha Date of Exam: 10/27/2019 Medical Rec #:  [id number]    Height:       65.0 in Accession #:    [id number]   Weight:       196.6 lb Date of Birth:  [birth year]    BSA:          1.964 m Patient Age:    55 years     BP:           148/66 mmHg Patient Gender: F            HR:           84 bpm. Exam Location:  Inpatient Procedure: Limited Echo, Limited Color Doppler and Cardiac Doppler Indications:     Dyspnea 786.09 /  R06.00  History:         Patient has prior history of Echocardiogram examinations, most                  recent 08/10/2019. CAD; Prior CABG. Previous Myocardial                  Infarction, Prior                  CABG, COPD; Signs/Symptoms:Shortness of Breath. 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                  Parapneumonic right-sided effusion.  Sonographer:     Darlina Sicilian RDCS Referring Phys:  [id number] COURAGE EMOKPAE Diagnosing Phys: Fransico Him MD  Sonographer Comments: Image acquisition challenging due to patient behavioral factors. IMPRESSIONS  1. Left ventricular endocardial border not optimally defined to evaluate regional wall motion. Left ventricular diastolic function could not be evaluated.Patient was not cooperative with exam.  2. Right ventricular systolic function was not well visualized. The right ventricular size is not well visualized.  3. The mitral valve is normal in structure. Mild mitral valve regurgitation. No evidence of mitral stenosis. Moderate mitral annular calcification.  4. The aortic valve is calcified. There is severe calcifcation of the aortic valve. There is moderate thickening of the aortic valve. Aortic valve regurgitation is not visualized. There is likely severe AS but study not sufficient for adequate assessment.  5. Recommend repeat 2D echo full study once patient able to cooperate. FINDINGS  Left Ventricle: Left ventricular endocardial border not optimally defined to evaluate regional wall motion. The left ventricular internal cavity size was normal in size. There is no left ventricular hypertrophy. Left ventricular diastolic function could  not be evaluated. Right Ventricle: The right ventricular size is not well visualized. Right vetricular wall thickness was not assessed. Right ventricular systolic function was not well visualized. Left Atrium: Left atrial size was not well visualized. Right  Atrium: Right atrial size  was not well visualized. Pericardium: There is no evidence of pericardial effusion. Mitral Valve: The mitral valve is normal in structure. Moderate mitral annular calcification. Mild mitral valve regurgitation. No evidence of mitral valve stenosis. Tricuspid Valve: The tricuspid valve is normal in structure. Tricuspid valve regurgitation is mild . No evidence of tricuspid stenosis. Aortic Valve: The aortic valve is calcified. There is severe calcifcation of the aortic valve. There is moderate thickening of the aortic valve. Aortic valve regurgitation is not visualized. There is likely severe AS but study not sufficient for adequate  assessment. Pulmonic Valve: The pulmonic valve was not assessed. Aorta: The aortic root is normal in size and structure. IAS/Shunts: No atrial level shunt detected by color flow Doppler. LEFT VENTRICLE PLAX 2D LVOT diam:     1.90 cm LVOT Area:     2.84 cm   AORTA Ao Root diam: 2.80 cm  SHUNTS Systemic Diam: 1.90 cm Fransico Him MD Electronically signed by Fransico Him MD Signature Date/Time: 10/27/2019/2:08:25 PM    Final (Updated)    [place] EKG SITE RITE  Result Date: 10/28/2019 If Site Rite image not attached, placement could not be confirmed due to current cardiac rhythm.  CT Maxillofacial WO CM  Result Date: 11/12/2019 CLINICAL DATA:  Poly trauma, critical, head/cervical spine injury suspected. Facial trauma. Additional history provided: Fall, laceration to bottom lip, multiple bruises, patient also reports falling yesterday. EXAM: CT HEAD WITHOUT CONTRAST CT MAXILLOFACIAL WITHOUT CONTRAST CT CERVICAL SPINE WITHOUT CONTRAST TECHNIQUE: Multidetector CT imaging of the head, cervical spine, and maxillofacial structures were performed using the standard protocol without intravenous contrast. Multiplanar CT image reconstructions of the cervical spine and maxillofacial structures were also generated. COMPARISON:  Head CT 10/25/2019. FINDINGS: CT HEAD FINDINGS Brain: Stable, mild  generalized cerebral atrophy. Stable, mild patchy hypoattenuation within the cerebral white matter which is nonspecific, but consistent with chronic small vessel ischemic disease. There is acute parafalcine subdural hemorrhage measuring up to 4 mm (for instance as seen on series 2, image 26) (series 4, image 35). Trace acute subdural hemorrhage is also questioned along the left tentorium (series 2, images 11 and 12). No demarcated cortical infarct is identified. No evidence of intracranial mass. No midline shift. Vascular: No hyperdense vessel. Skull: Normal. Negative for fracture or focal lesion. Other: Bilateral mastoid effusions. CT MAXILLOFACIAL FINDINGS Osseous: Motion degradation limits evaluation of the mandibular body. No acute maxillofacial fracture is identified. Orbits: No acute finding. The globes are normal in size and contour. The extraocular muscles and optic nerve sheath complexes are symmetric and unremarkable. Sinuses: Mild ethmoid and right sphenoid sinus mucosal thickening. Soft tissues: Midline forehead and right maxillofacial soft tissue swelling/hematoma. Incidentally noted anterior nasal septal defect (series 8, image 19). CT CERVICAL SPINE FINDINGS Alignment: A mild cervical levocurvature may be positional. Straightening of the expected cervical lordosis. Trace C5-C6 grade 1 retrolisthesis Skull base and vertebrae: The basion-dental and atlanto-dental intervals are maintained.No evidence of acute fracture to the cervical spine. Soft tissues and spinal canal: No prevertebral fluid or swelling. No visible canal hematoma. Disc levels: Cervical spondylosis with multilevel disc space narrowing, posterior disc osteophytes, uncovertebral and facet hypertrophy. These findings are greatest at C5-C6, C6-C7 and C7-T1. Upper chest: No consolidation within the imaged lung apices. No visible pneumothorax. These results were called by telephone at the time of interpretation on 11/12/2019 at 6:16 pm to  provider Carmin Muskrat , who verbally acknowledged these results. IMPRESSION: CT head: 1. Acute parafalcine subdural hemorrhage  measuring up to 4 mm. Trace acute subdural hemorrhage is also questioned along the left tentorium. 2. Stable mild generalized cerebral atrophy and chronic small vessel ischemic disease. 3. Bilateral mastoid effusions. CT maxillofacial 1. Evaluation of the mandibular body is limited due to motion degradation. 2. No acute maxillofacial fracture is identified. 3. Midline forehead and right maxillofacial soft tissue swelling/hematoma. 4. Mild ethmoid and right sphenoid sinus mucosal thickening. 5. Incidentally noted anterior nasal septal defect. CT cervical spine: 1. No evidence of acute fracture to the cervical spine. 2. Trace C5-C6 grade 1 retrolisthesis. 3. Cervical spondylosis as described and greatest at C5-C6, C6-C7 and C7-T1. Electronically Signed   By: Kellie Simmering DO   On: 11/12/2019 18:17     Microbiology: Recent Results (from the past 240 hour(s))  Resp Panel by RT PCR (RSV, Flu A&B, Covid) - Nasopharyngeal Swab     Status: None   Collection Time: 11/12/19  7:30 PM   Specimen: Nasopharyngeal Swab  Result Value Ref Range Status   SARS Coronavirus 2 by RT PCR NEGATIVE NEGATIVE Final    Comment: (NOTE) SARS-CoV-2 target nucleic acids are NOT DETECTED.  The SARS-CoV-2 RNA is generally detectable in upper respiratoy specimens during the acute phase of infection. The lowest concentration of SARS-CoV-2 viral copies this assay can detect is 131 copies/mL. A negative result does not preclude SARS-Cov-2 infection and should not be used as the sole basis for treatment or other patient management decisions. A negative result may occur with  improper specimen collection/handling, submission of specimen other than nasopharyngeal swab, presence of viral mutation(s) within the areas targeted by this assay, and inadequate number of viral copies (<131 copies/mL). A negative  result must be combined with clinical observations, patient history, and epidemiological information. The expected result is Negative.  Fact Sheet for Patients:  PinkCheek.be  Fact Sheet for Healthcare Providers:  GravelBags.it  This test is no t yet approved or cleared by the [place] FDA and  has been authorized for detection and/or diagnosis of SARS-CoV-2 by FDA under an Emergency Use Authorization (EUA). This EUA will remain  in effect (meaning this test can be used) for the duration of the COVID-19 declaration under Section 564(b)(1) of the Act, 21 U.S.C. section 360bbb-3(b)(1), unless the authorization is terminated or revoked sooner.     Influenza A by PCR NEGATIVE NEGATIVE Final   Influenza B by PCR NEGATIVE NEGATIVE Final    Comment: (NOTE) The Xpert Xpress SARS-CoV-2/FLU/RSV assay is intended as an aid in  the diagnosis of influenza from Nasopharyngeal swab specimens and  should not be used as a sole basis for treatment. Nasal washings and  aspirates are unacceptable for Xpert Xpress SARS-CoV-2/FLU/RSV  testing.  Fact Sheet for Patients: PinkCheek.be  Fact Sheet for Healthcare Providers: GravelBags.it  This test is not yet approved or cleared by the [place] FDA and  has been authorized for detection and/or diagnosis of SARS-CoV-2 by  FDA under an Emergency Use Authorization (EUA). This EUA will remain  in effect (meaning this test can be used) for the duration of the  Covid-19 declaration under Section 564(b)(1) of the Act, 21  U.S.C. section 360bbb-3(b)(1), unless the authorization is  terminated or revoked.    Respiratory Syncytial Virus by PCR NEGATIVE NEGATIVE Final    Comment: (NOTE) Fact Sheet for Patients: PinkCheek.be  Fact Sheet for Healthcare  Providers: GravelBags.it  This test is not yet approved or cleared by the [place] and  has been authorized for  detection and/or diagnosis of SARS-CoV-2 by  FDA under an Emergency Use Authorization (EUA). This EUA will remain  in effect (meaning this test can be used) for the duration of the  COVID-19 declaration under Section 564(b)(1) of the Act, 21 U.S.C.  section 360bbb-3(b)(1), unless the authorization is terminated or  revoked. Performed at [facility], [address], [place], [place] [address]      Labs: Basic Metabolic Panel: Recent Labs  Lab 11/12/19 1851 11/13/19 0330  NA 138 140  K 3.9 3.6  CL 98 100  CO2 34* 32  GLUCOSE 100* 98  BUN 6* 7*  CREATININE 0.39* 0.38*  CALCIUM 8.8* 8.7*  MG  --  2.0  PHOS  --  4.1   Liver Function Tests: Recent Labs  Lab 11/12/19 1851 11/13/19 0330  AST 22 18  ALT 19 18  ALKPHOS 59 55  BILITOT 0.7 0.7  PROT 6.3* 5.8*  ALBUMIN 3.0* 2.8*   Recent Labs  Lab 11/12/19 1851  LIPASE 23   No results for input(s): AMMONIA in the last 168 hours. CBC: Recent Labs  Lab 11/12/19 1851 11/13/19 0330  WBC 5.2 4.2  NEUTROABS 4.0  --   HGB 9.9* 9.6*  HCT 33.1* 32.8*  MCV 93.0 94.5  PLT 196 193   Cardiac Enzymes: Recent Labs  Lab 11/12/19 1851  CKTOTAL 141   BNP: Invalid input(s): POCBNP CBG: No results for input(s): GLUCAP in the last 168 hours.  Time coordinating discharge:  36 minutes  Signed:  Orson Eva, DO [facility] Pager: [phone] 11/13/2019, 5:27 PM

## 2019-11-13 NOTE — Plan of Care (Signed)
°  Problem: Acute Rehab PT Goals(only PT should resolve) Goal: Patient Will Transfer Sit To/From Stand Outcome: Progressing Flowsheets (Taken 11/13/2019 1056) Patient will transfer sit to/from stand: Independently Goal: Pt Will Transfer Bed To Chair/Chair To Bed Outcome: Progressing Flowsheets (Taken 11/13/2019 1056) Pt will Transfer Bed to Chair/Chair to Bed: Independently Goal: Pt Will Ambulate Outcome: Progressing Flowsheets (Taken 11/13/2019 1056) Pt will Ambulate:  > 125 feet  with modified independence   10:57 AM, 11/13/19 Lonell Grandchild, MPT Physical Therapist with [facility] 336 [phone] office [phone] mobile phone

## 2019-11-13 NOTE — Progress Notes (Signed)
EEG Completed; Results Pending  

## 2019-11-13 NOTE — Procedures (Signed)
Patient Name: Emily Rocha  MRN: [id number]  Epilepsy Attending: Lora Havens  Referring Physician/Provider: Dr. Shanon Brow Tat Date: 11/13/1019 Duration: 23.19 minutes  Patient history: 64 year old female with subdural hematoma and syncope.  EEG evaluate for seizures.  Level of alertness: Awake  AEDs during EEG study: None  Technical aspects: This EEG study was done with scalp electrodes positioned according to the 10-20 International system of electrode placement. Electrical activity was acquired at a sampling rate of 500Hz  and reviewed with a high frequency filter of 70Hz  and a low frequency filter of 1Hz . EEG data were recorded continuously and digitally stored.   Description: The posterior dominant rhythm consists of 9 Hz activity of moderate voltage (25-35 uV) seen predominantly in posterior head regions, symmetric and reactive to eye opening and eye closing.  EEG showed intermittent generalized 3 to 6 Hz theta-delta slowing. Hyperventilation and photic stimulation were not performed.     ABNORMALITY -Intermittent slow, generalized  IMPRESSION: This study is suggestive of mild diffuse encephalopathy, nonspecific etiology.  No seizures or epileptiform discharges were seen throughout the recording.  [name]

## 2019-11-13 NOTE — Telephone Encounter (Signed)
Zio 14 day monitor applied to patient in ED room 5 per Dr.Branch for syncope.  Instructions for use given to patient and I left message for husband to call back.

## 2019-11-13 NOTE — Evaluation (Signed)
Physical Therapy Evaluation Patient Details Name: Emily Rocha MRN: [id number] DOB: [birth year] Today's Date: 11/13/2019   History of Present Illness  Emily Rocha is a 64 y.o. female with medical history significant for COPD on 3LPM of O2 via [place] (24/7), coronary artery disease s/p CABG 11/2016), adenocarcinoma of the right lung requiring VATS and right upper lobe lobectomy (10/2018) and hiatal hernia who presents to the emergency department after sustaining recurrent falls within last 2 days.  Patient states that she sustained a fall 2 days ago (9/25) and had another fall last night (9/26).  She states that she stumbled over her oxygen tank today striking her face and was on the ground for approximately 4 hours, patient denies loss of consciousness, weakness, seizures.  EMS was activated and patient was noted to have sustained a lower lip laceration.  Patient denies any change in medication or diet, she denies headache, facial pain, nausea, vomiting, chest pain or abdominal pain.    Clinical Impression  Patient functioning near baseline for functional mobility and gait other than SpO2 dropping from 98% to 85% while on room air and became very SOB while seated at bedside.  Patient states she was not using her supplemental O2 at home most of time and encouraged to do so when she returns home.  Patient demonstrates good return for completing transfers and ambulating in hallway without loss of balance pushing O2 tank in holder, on 2 LPM with SpO2 at 93-95%.  Patient tolerated sitting up in chair after therapy - RN notified.  Patient will benefit from continued physical therapy in hospital and recommended venue below to increase strength, balance, endurance for safe ADLs and gait.      Follow Up Recommendations Home health PT;Supervision for mobility/OOB;Supervision - Intermittent    Equipment Recommendations  None recommended by PT    Recommendations for Other Services       Precautions /  Restrictions Precautions Precautions: None Precaution Comments: Monitor SpO2 Restrictions Weight Bearing Restrictions: No      Mobility  Bed Mobility Overal bed mobility: Modified Independent             General bed mobility comments: slightly labored  Transfers Overall transfer level: Needs assistance Equipment used: None Transfers: Sit to/from Stand;Stand Pivot Transfers Sit to Stand: Supervision Stand pivot transfers: Supervision       General transfer comment: slightly increased time  Ambulation/Gait Ambulation/Gait assistance: Supervision Gait Distance (Feet): 100 Feet Assistive device: None (pusing O2 tankl holder) Gait Pattern/deviations: WFL(Within Functional Limits) Gait velocity: slightly decreased   General Gait Details: demonstrates good return for ambulation in room, hallways without loss of balance while on 2 LPM with SpO2 at 93%  Stairs            Wheelchair Mobility    Modified Rankin (Stroke Patients Only)       Balance Overall balance assessment: No apparent balance deficits (not formally assessed)                                           Pertinent Vitals/Pain Pain Assessment: No/denies pain    Home Living Family/patient expects to be discharged to:: Private residence Living Arrangements: Spouse/significant other Available Help at Discharge: Family;Available PRN/intermittently Type of Home: House Home Access: Level entry     Home Layout: One level Home Equipment: Bedside commode      Prior Function Level  of Independence: Independent         Comments: Hydrographic surveyor, drives     Hand Dominance   Dominant Hand: Right    Extremity/Trunk Assessment   Upper Extremity Assessment Upper Extremity Assessment: Overall WFL for tasks assessed    Lower Extremity Assessment Lower Extremity Assessment: Overall WFL for tasks assessed    Cervical / Trunk Assessment Cervical / Trunk Assessment:  Normal  Communication   Communication: No difficulties  Cognition Arousal/Alertness: Awake/alert Behavior During Therapy: WFL for tasks assessed/performed Overall Cognitive Status: Within Functional Limits for tasks assessed                                        General Comments      Exercises     Assessment/Plan    PT Assessment Patient needs continued PT services  PT Problem List Decreased strength;Decreased activity tolerance;Decreased balance;Decreased mobility       PT Treatment Interventions Gait training;Stair training;Functional mobility training;Therapeutic activities;Therapeutic exercise;Balance training;Patient/family education    PT Goals (Current goals can be found in the Care Plan section)  Acute Rehab PT Goals Patient Stated Goal: return home with family to assist PT Goal Formulation: With patient Time For Goal Achievement: 11/16/19 Potential to Achieve Goals: Good    Frequency Min 3X/week   Barriers to discharge        Co-evaluation               AM-PAC PT "6 Clicks" Mobility  Outcome Measure Help needed turning from your back to your side while in a flat bed without using bedrails?: None Help needed moving from lying on your back to sitting on the side of a flat bed without using bedrails?: None Help needed moving to and from a bed to a chair (including a wheelchair)?: A Little Help needed standing up from a chair using your arms (e.g., wheelchair or bedside chair)?: None Help needed to walk in hospital room?: A Little Help needed climbing 3-5 steps with a railing? : A Little 6 Click Score: 21    End of Session Equipment Utilized During Treatment: Oxygen;Gait belt   Patient left: in chair;with call bell/phone within reach Nurse Communication: Mobility status PT Visit Diagnosis: Unsteadiness on feet (R26.81);Other abnormalities of gait and mobility (R26.89);Muscle weakness (generalized) (M62.81);Repeated falls (R29.6)     Time: 3335-4562 PT Time Calculation (min) (ACUTE ONLY): 31 min   Charges:   PT Evaluation $PT Eval Moderate Complexity: 1 Mod PT Treatments $Therapeutic Activity: 23-37 mins        10:53 AM, 11/13/19 Lonell Grandchild, MPT Physical Therapist with [facility] [phone] office [phone] mobile phone

## 2019-11-13 NOTE — Progress Notes (Signed)
PROGRESS NOTE  Emily Rocha ZTI:[id number] DOB: Aug 01, 1955 DOA: 11/12/2019 PCP: Glenda Chroman, MD  Brief History:  64 year old female with a history of chronic respiratory failure on 3 L, COPD, coronary artery disease, adenocarcinoma of the right lung status post right upper lobe lobectomy, diastolic CHF, moderate to severe aortic stenosis, presenting after episodes of falling and syncope.  Initially, the patient told the physicians in the emergency department that she did not lose consciousness.  However in the morning of 11/13/2019 the patient told me the she had had syncopal episode on 11/11/2019 and 11/12/2019.  The patient stated that she was walking down the hallway without any assistance when she "blacked out".  She stated that she woke up with her face on the ground.  She broke her dentures.  Apparently, the patient had laid on the ground for approximately 4 hours.  There was no bowel or bladder incontinence.  The patient denied any prodromal symptoms including aura, palpitations, chest discomfort, shortness of breath.  Notably, the patient had recent hospital admission from 10/24/2019 to 11/06/2019.  Her hospitalization was prolonged secondary to hospital delirium and acute on chronic respiratory failure with hypercarbia and hypoxia.  During that hospitalization, the patient was treated for pneumonia with a complicated loculated right pleural effusion.  Initially, there was concern that this may have represented an empyema.  However repeat imaging showed that the size of the pleural effusion improved and she did not require any further operative intervention.  She was treated for an acute COPD exacerbation with steroids and required BiPAP for a period of time.  Her hospital delirium required an ICU transfer where she was subsequently weaned off of Precedex on 11/02/2019.  She was subsequently discharged home with home health PT on 3 L nasal cannula with ambulation, 2 L at rest. In the emergency  department, the patient was afebrile hemodynamically stable with oxygen saturation 96 to 90% on 2 L.  BMP, LFTs, and CBC were essentially unremarkable.  CT of the brain showed an acute parafalcine SDH and trace acute SDH in the left tentorium.  CT of the maxillofacial area did not show any acute fracture.  CT of the cervical spine was negative.  Assessment/Plan: Subdural hematoma -11/12/2019 CT brain as discussed above -Neurosurgery was consulted--> recommended discontinuing apixaban, nonoperative management, repeat CT on 11/13/2019 -Repeat CT brain -PT evaluation -Holding apixaban until the patient follows up with neurosurgery in the office  Syncope -Check orthostatic vital signs -11/04/2019 echo EF 60 to 65%, no WMA, moderate to severe AS with VTI 1.03 cm, mild MR -Cardiology consult -Remain on telemetry -EEG  Chronic respiratory failure with hypoxia -Normally on 2 L at rest, 3 L with ambulation -Stable presently  COPD -Quit smoking 4 years ago -Start duo nebs -Start Pulmicort  Moderate to severe aortic stenosis -11/04/2019 echo--EF 60-65%, no WMA, moderate to severe AS with VTI 1.03 cm -Cardiology evaluation as discussed above  Pulmonary arterial thrombus -Noted on CTA 10/24/2019 -Holding apixaban temporarily secondary to subdural hematoma until she follows up with neurosurgery in the office -Restart apixaban when cleared by neurosurgery -10/29/2019 venous duplex bilateral lower extremities negative for DVT  Chronic diastolic CHF -Clinically euvolemic -Personally reviewed chest x-ray--small right pleural effusion without gross pulmonary edema  Coronary artery disease -No chest pain presently -Status post CABG 2018 -Personally reviewed EKG--sinus rhythm, nonspecific T wave changes -Continue Imdur -Holding metoprolol succinate secondary to soft blood pressure  Anxiety -Continue home dose  alprazolam 0.5 mg 3 times daily  Hyperlipidemia -Continue statin  Remote history  of adenocarcinoma the lung status post right upper lobe lobectomy (2018) -Continue outpatient follow-up and surveillance  Impaired glucose tolerance -A1C--6.2 -lifestyle modification    Status is: Observation  The patient remains OBS appropriate and will d/c before 2 midnights.  Dispo: The patient is from: Home              Anticipated d/c is to: Home              Anticipated d/c date is: 1 day              Patient currently is not medically stable to d/c.        Family Communication:   Called spouse--left VM 9/28  Consultants:  cardiology  Code Status:  FULL   DVT Prophylaxis:  SCDs   Procedures: As Listed in Progress Note Above  Antibiotics: None       Subjective: Patient denies fevers, chills, headache, chest pain, dyspnea, nausea, vomiting, diarrhea, abdominal pain, dysuria, hematuria, hematochezia, and melena.   Objective: Vitals:   11/13/19 0330 11/13/19 0430 11/13/19 0600 11/13/19 0630  BP: 110/62 (!) 107/55 (!) 101/59 109/60  Pulse: 99 93 89 92  Resp: 20 (!) 24 (!) 22 (!) 21  SpO2: 96% 98% 95% 96%  Weight:      Height:       No intake or output data in the 24 hours ending 11/13/19 0658 Weight change:  Exam:   General:  Pt is alert, follows commands appropriately, not in acute distress  HEENT: No icterus, No thrush, No neck mass, /AT  Cardiovascular: RRR, S1/S2, no rubs, no gallops  Respiratory: CTA bilaterally, no wheezing, no crackles, no rhonchi  Abdomen: Soft/+BS, non tender, non distended, no guarding  Extremities: No edema, No lymphangitis, No petechiae, No rashes, no synovitis   Data Reviewed: I have personally reviewed following labs and imaging studies Basic Metabolic Panel: Recent Labs  Lab 11/12/19 1851 11/13/19 0330  NA 138 140  K 3.9 3.6  CL 98 100  CO2 34* 32  GLUCOSE 100* 98  BUN 6* 7*  CREATININE 0.39* 0.38*  CALCIUM 8.8* 8.7*  MG  --  2.0  PHOS  --  4.1   Liver Function Tests: Recent Labs  Lab  11/12/19 1851 11/13/19 0330  AST 22 18  ALT 19 18  ALKPHOS 59 55  BILITOT 0.7 0.7  PROT 6.3* 5.8*  ALBUMIN 3.0* 2.8*   Recent Labs  Lab 11/12/19 1851  LIPASE 23   No results for input(s): AMMONIA in the last 168 hours. Coagulation Profile: Recent Labs  Lab 11/13/19 0330  INR 1.2   CBC: Recent Labs  Lab 11/12/19 1851 11/13/19 0330  WBC 5.2 4.2  NEUTROABS 4.0  --   HGB 9.9* 9.6*  HCT 33.1* 32.8*  MCV 93.0 94.5  PLT 196 193   Cardiac Enzymes: Recent Labs  Lab 11/12/19 1851  CKTOTAL 141   BNP: Invalid input(s): POCBNP CBG: No results for input(s): GLUCAP in the last 168 hours. HbA1C: No results for input(s): HGBA1C in the last 72 hours. Urine analysis:    Component Value Date/Time   COLORURINE YELLOW 10/24/2018 1540   APPEARANCEUR CLEAR 10/24/2018 1540   LABSPEC 1.012 10/24/2018 1540   PHURINE 5.0 10/24/2018 1540   GLUCOSEU NEGATIVE 10/24/2018 1540   HGBUR NEGATIVE 10/24/2018 1540   BILIRUBINUR NEGATIVE 10/24/2018 [place] 10/24/2018 1540   PROTEINUR  NEGATIVE 10/24/2018 1540   UROBILINOGEN 0.2 12/04/2014 1737   NITRITE NEGATIVE 10/24/2018 1540   LEUKOCYTESUR SMALL (A) 10/24/2018 1540   Sepsis Labs: @LABRCNTIP (procalcitonin:4,lacticidven:4) ) Recent Results (from the past 240 hour(s))  Resp Panel by RT PCR (RSV, Flu A&B, Covid) - Nasopharyngeal Swab     Status: None   Collection Time: 11/12/19  7:30 PM   Specimen: Nasopharyngeal Swab  Result Value Ref Range Status   SARS Coronavirus 2 by RT PCR NEGATIVE NEGATIVE Final    Comment: (NOTE) SARS-CoV-2 target nucleic acids are NOT DETECTED.  The SARS-CoV-2 RNA is generally detectable in upper respiratoy specimens during the acute phase of infection. The lowest concentration of SARS-CoV-2 viral copies this assay can detect is 131 copies/mL. A negative result does not preclude SARS-Cov-2 infection and should not be used as the sole basis for treatment or other patient management  decisions. A negative result may occur with  improper specimen collection/handling, submission of specimen other than nasopharyngeal swab, presence of viral mutation(s) within the areas targeted by this assay, and inadequate number of viral copies (<131 copies/mL). A negative result must be combined with clinical observations, patient history, and epidemiological information. The expected result is Negative.  Fact Sheet for Patients:  PinkCheek.be  Fact Sheet for Healthcare Providers:  GravelBags.it  This test is no t yet approved or cleared by the [place] FDA and  has been authorized for detection and/or diagnosis of SARS-CoV-2 by FDA under an Emergency Use Authorization (EUA). This EUA will remain  in effect (meaning this test can be used) for the duration of the COVID-19 declaration under Section 564(b)(1) of the Act, 21 U.S.C. section 360bbb-3(b)(1), unless the authorization is terminated or revoked sooner.     Influenza A by PCR NEGATIVE NEGATIVE Final   Influenza B by PCR NEGATIVE NEGATIVE Final    Comment: (NOTE) The Xpert Xpress SARS-CoV-2/FLU/RSV assay is intended as an aid in  the diagnosis of influenza from Nasopharyngeal swab specimens and  should not be used as a sole basis for treatment. Nasal washings and  aspirates are unacceptable for Xpert Xpress SARS-CoV-2/FLU/RSV  testing.  Fact Sheet for Patients: PinkCheek.be  Fact Sheet for Healthcare Providers: GravelBags.it  This test is not yet approved or cleared by the [place] FDA and  has been authorized for detection and/or diagnosis of SARS-CoV-2 by  FDA under an Emergency Use Authorization (EUA). This EUA will remain  in effect (meaning this test can be used) for the duration of the  Covid-19 declaration under Section 564(b)(1) of the Act, 21  U.S.C. section 360bbb-3(b)(1), unless the  authorization is  terminated or revoked.    Respiratory Syncytial Virus by PCR NEGATIVE NEGATIVE Final    Comment: (NOTE) Fact Sheet for Patients: PinkCheek.be  Fact Sheet for Healthcare Providers: GravelBags.it  This test is not yet approved or cleared by the [place] FDA and  has been authorized for detection and/or diagnosis of SARS-CoV-2 by  FDA under an Emergency Use Authorization (EUA). This EUA will remain  in effect (meaning this test can be used) for the duration of the  COVID-19 declaration under Section 564(b)(1) of the Act, 21 U.S.C.  section 360bbb-3(b)(1), unless the authorization is terminated or  revoked. Performed at [facility], [address] [place], [place] [address]      Scheduled Meds: . atorvastatin  40 mg Oral Daily  . feeding supplement (ENSURE ENLIVE)  237 mL Oral BID BM  . ipratropium-albuterol  3 mL Nebulization Q6H  .  pantoprazole  40 mg Oral Daily   Continuous Infusions:  Procedures/Studies: DG Chest 2 View  Result Date: 11/12/2019 CLINICAL DATA:  Status post fall. EXAM: CHEST - 2 VIEW COMPARISON:  November 02, 2019 FINDINGS: Multiple sternal wires are seen. Mild, diffuse, chronic appearing increased interstitial lung markings are noted. Radiopaque surgical clips are seen along the medial aspect of the right apex. Ill-defined surgical sutures are seen overlying the right lung base. Mild right basilar atelectasis is also present. There is very mild blunting of the right costophrenic angle. No pneumothorax is seen. The heart size and mediastinal contours are within normal limits. There is mild calcification of the aortic arch. Surgical clips are seen overlying the right upper quadrant. The visualized skeletal structures are unremarkable. IMPRESSION: 1. Evidence of prior median sternotomy/CABG. 2. Postoperative changes within the right lung base with mild right basilar atelectasis. 3. Very  small right pleural effusion. Electronically Signed   By: Virgina Norfolk M.D.   On: 11/12/2019 18:08   CT Head Wo Contrast  Result Date: 11/12/2019 CLINICAL DATA:  Poly trauma, critical, head/cervical spine injury suspected. Facial trauma. Additional history provided: Fall, laceration to bottom lip, multiple bruises, patient also reports falling yesterday. EXAM: CT HEAD WITHOUT CONTRAST CT MAXILLOFACIAL WITHOUT CONTRAST CT CERVICAL SPINE WITHOUT CONTRAST TECHNIQUE: Multidetector CT imaging of the head, cervical spine, and maxillofacial structures were performed using the standard protocol without intravenous contrast. Multiplanar CT image reconstructions of the cervical spine and maxillofacial structures were also generated. COMPARISON:  Head CT 10/25/2019. FINDINGS: CT HEAD FINDINGS Brain: Stable, mild generalized cerebral atrophy. Stable, mild patchy hypoattenuation within the cerebral white matter which is nonspecific, but consistent with chronic small vessel ischemic disease. There is acute parafalcine subdural hemorrhage measuring up to 4 mm (for instance as seen on series 2, image 26) (series 4, image 35). Trace acute subdural hemorrhage is also questioned along the left tentorium (series 2, images 11 and 12). No demarcated cortical infarct is identified. No evidence of intracranial mass. No midline shift. Vascular: No hyperdense vessel. Skull: Normal. Negative for fracture or focal lesion. Other: Bilateral mastoid effusions. CT MAXILLOFACIAL FINDINGS Osseous: Motion degradation limits evaluation of the mandibular body. No acute maxillofacial fracture is identified. Orbits: No acute finding. The globes are normal in size and contour. The extraocular muscles and optic nerve sheath complexes are symmetric and unremarkable. Sinuses: Mild ethmoid and right sphenoid sinus mucosal thickening. Soft tissues: Midline forehead and right maxillofacial soft tissue swelling/hematoma. Incidentally noted anterior  nasal septal defect (series 8, image 19). CT CERVICAL SPINE FINDINGS Alignment: A mild cervical levocurvature may be positional. Straightening of the expected cervical lordosis. Trace C5-C6 grade 1 retrolisthesis Skull base and vertebrae: The basion-dental and atlanto-dental intervals are maintained.No evidence of acute fracture to the cervical spine. Soft tissues and spinal canal: No prevertebral fluid or swelling. No visible canal hematoma. Disc levels: Cervical spondylosis with multilevel disc space narrowing, posterior disc osteophytes, uncovertebral and facet hypertrophy. These findings are greatest at C5-C6, C6-C7 and C7-T1. Upper chest: No consolidation within the imaged lung apices. No visible pneumothorax. These results were called by telephone at the time of interpretation on 11/12/2019 at 6:16 pm to provider Carmin Muskrat , who verbally acknowledged these results. IMPRESSION: CT head: 1. Acute parafalcine subdural hemorrhage measuring up to 4 mm. Trace acute subdural hemorrhage is also questioned along the left tentorium. 2. Stable mild generalized cerebral atrophy and chronic small vessel ischemic disease. 3. Bilateral mastoid effusions. CT maxillofacial 1. Evaluation of the mandibular  body is limited due to motion degradation. 2. No acute maxillofacial fracture is identified. 3. Midline forehead and right maxillofacial soft tissue swelling/hematoma. 4. Mild ethmoid and right sphenoid sinus mucosal thickening. 5. Incidentally noted anterior nasal septal defect. CT cervical spine: 1. No evidence of acute fracture to the cervical spine. 2. Trace C5-C6 grade 1 retrolisthesis. 3. Cervical spondylosis as described and greatest at C5-C6, C6-C7 and C7-T1. Electronically Signed   By: Kellie Simmering DO   On: 11/12/2019 18:17   CT HEAD WO CONTRAST  Result Date: 10/25/2019 CLINICAL DATA:  64 year old female with hypoxia and altered mental status. EXAM: CT HEAD WITHOUT CONTRAST TECHNIQUE: Contiguous axial images  were obtained from the base of the skull through the vertex without intravenous contrast. COMPARISON:  Head CT 09/28/2017. FINDINGS: Brain: No midline shift, ventriculomegaly, mass effect, evidence of mass lesion, intracranial hemorrhage or evidence of cortically based acute infarction. Scattered, patchy bilateral cerebral white matter hypodensity appears not significantly changed. Otherwise gray-white matter differentiation remains within normal limits. Vascular: Calcified atherosclerosis at the skull base. Skull: No acute osseous abnormality identified. Sinuses/Orbits: Visualized paranasal sinuses and mastoids are stable and well pneumatized. Other: Visualized orbits and scalp soft tissues are within normal limits. IMPRESSION: 1. No acute intracranial abnormality by CT. 2. Patchy chronic white matter changes, most commonly due to chronic small vessel disease. Electronically Signed   By: Genevie Ann M.D.   On: 10/25/2019 16:10   CT Angio Chest PE W and/or Wo Contrast  Result Date: 10/24/2019 CLINICAL DATA:  Chest pain. EXAM: CT ANGIOGRAPHY CHEST WITH CONTRAST TECHNIQUE: Multidetector CT imaging of the chest was performed using the standard protocol during bolus administration of intravenous contrast. Multiplanar CT image reconstructions and MIPs were obtained to evaluate the vascular anatomy. CONTRAST:  165mL OMNIPAQUE IOHEXOL 350 MG/ML SOLN COMPARISON:  February 27, 2019. FINDINGS: Cardiovascular: Atherosclerosis of thoracic aorta is noted without aneurysm or dissection. Status post coronary bypass graft. The patient is status post right upper lobectomy. There is the interval development of thrombus within the most distal portion of the stump of the right upper lobe branch of the right pulmonary artery. No definite embolus is noted. Mild cardiomegaly is noted. No pericardial effusion is noted. Mediastinum/Nodes: Moderate size sliding-type hiatal hernia is noted. Thyroid gland is unremarkable. No adenopathy is noted.  Lungs/Pleura: No pneumothorax is noted. Left lung is clear. Moderate size loculated right pleural effusion is noted with associated subsegmental atelectasis of the right lower lobe. Status post right upper lobectomy as described above. Upper Abdomen: No acute abnormality. Musculoskeletal: No chest wall abnormality. No acute or significant osseous findings. Review of the MIP images confirms the above findings. IMPRESSION: 1. Status post right upper lobectomy. Interval development of thrombus within the most distal portion of the residual stump of the right upper lobe branch of the right pulmonary artery. No definite pulmonary embolus is noted. 2. Moderate size loculated right pleural effusion is noted with associated subsegmental atelectasis of the right lower lobe. 3. Moderate size sliding-type hiatal hernia. 4. Aortic atherosclerosis. Aortic Atherosclerosis (ICD10-I70.0). Electronically Signed   By: Marijo Conception M.D.   On: 10/24/2019 15:47   CT Cervical Spine Wo Contrast  Result Date: 11/12/2019 CLINICAL DATA:  Poly trauma, critical, head/cervical spine injury suspected. Facial trauma. Additional history provided: Fall, laceration to bottom lip, multiple bruises, patient also reports falling yesterday. EXAM: CT HEAD WITHOUT CONTRAST CT MAXILLOFACIAL WITHOUT CONTRAST CT CERVICAL SPINE WITHOUT CONTRAST TECHNIQUE: Multidetector CT imaging of the head, cervical spine, and  maxillofacial structures were performed using the standard protocol without intravenous contrast. Multiplanar CT image reconstructions of the cervical spine and maxillofacial structures were also generated. COMPARISON:  Head CT 10/25/2019. FINDINGS: CT HEAD FINDINGS Brain: Stable, mild generalized cerebral atrophy. Stable, mild patchy hypoattenuation within the cerebral white matter which is nonspecific, but consistent with chronic small vessel ischemic disease. There is acute parafalcine subdural hemorrhage measuring up to 4 mm (for instance  as seen on series 2, image 26) (series 4, image 35). Trace acute subdural hemorrhage is also questioned along the left tentorium (series 2, images 11 and 12). No demarcated cortical infarct is identified. No evidence of intracranial mass. No midline shift. Vascular: No hyperdense vessel. Skull: Normal. Negative for fracture or focal lesion. Other: Bilateral mastoid effusions. CT MAXILLOFACIAL FINDINGS Osseous: Motion degradation limits evaluation of the mandibular body. No acute maxillofacial fracture is identified. Orbits: No acute finding. The globes are normal in size and contour. The extraocular muscles and optic nerve sheath complexes are symmetric and unremarkable. Sinuses: Mild ethmoid and right sphenoid sinus mucosal thickening. Soft tissues: Midline forehead and right maxillofacial soft tissue swelling/hematoma. Incidentally noted anterior nasal septal defect (series 8, image 19). CT CERVICAL SPINE FINDINGS Alignment: A mild cervical levocurvature may be positional. Straightening of the expected cervical lordosis. Trace C5-C6 grade 1 retrolisthesis Skull base and vertebrae: The basion-dental and atlanto-dental intervals are maintained.No evidence of acute fracture to the cervical spine. Soft tissues and spinal canal: No prevertebral fluid or swelling. No visible canal hematoma. Disc levels: Cervical spondylosis with multilevel disc space narrowing, posterior disc osteophytes, uncovertebral and facet hypertrophy. These findings are greatest at C5-C6, C6-C7 and C7-T1. Upper chest: No consolidation within the imaged lung apices. No visible pneumothorax. These results were called by telephone at the time of interpretation on 11/12/2019 at 6:16 pm to provider Carmin Muskrat , who verbally acknowledged these results. IMPRESSION: CT head: 1. Acute parafalcine subdural hemorrhage measuring up to 4 mm. Trace acute subdural hemorrhage is also questioned along the left tentorium. 2. Stable mild generalized cerebral  atrophy and chronic small vessel ischemic disease. 3. Bilateral mastoid effusions. CT maxillofacial 1. Evaluation of the mandibular body is limited due to motion degradation. 2. No acute maxillofacial fracture is identified. 3. Midline forehead and right maxillofacial soft tissue swelling/hematoma. 4. Mild ethmoid and right sphenoid sinus mucosal thickening. 5. Incidentally noted anterior nasal septal defect. CT cervical spine: 1. No evidence of acute fracture to the cervical spine. 2. Trace C5-C6 grade 1 retrolisthesis. 3. Cervical spondylosis as described and greatest at C5-C6, C6-C7 and C7-T1. Electronically Signed   By: Kellie Simmering DO   On: 11/12/2019 18:17   [place] CHEST (PLEURAL EFFUSION)  Result Date: 10/25/2019 CLINICAL DATA:  RIGHT pleural effusion by CT, history of COPD, GERD, RIGHT upper lobe lung cancer post upper lobectomy EXAM: CHEST ULTRASOUND COMPARISON:  CT angio chest 10/24/2019 FINDINGS: Small pleural effusion identified at the RIGHT lung base. Effusion is partially loculated. Atelectatic RIGHT lower lobe is adherent to the posterior chest wall and prevents safe access of the inferior thoracic cavity for thoracentesis. This appearance was seen on the prior CT but appears fixed with change in position on ultrasound. IMPRESSION: Small loculated RIGHT pleural effusion, unable to safely access for thoracentesis. Electronically Signed   By: Lavonia Dana M.D.   On: 10/25/2019 11:00   CT CHEST LIMITED WO CONTRAST  Result Date: 11/02/2019 INDICATION: Loculated right-sided pleural effusion. Please perform image guided chest tube placement for infection source control purposes.  Note, there is attempt earlier this week to perform image guided chest tube placement however the patient could not tolerate the examination secondary to altered mental status. EXAM: LIMITED CHEST CT COMPARISON:  Chest CT-10/24/2019 MEDICATIONS: None ANESTHESIA/SEDATION: None CONTRAST:  None COMPLICATIONS: None immediate.  PROCEDURE: Informed written consent was obtained from the patient after a discussion of the risks, benefits and alternatives to treatment. The patient was placed supine, slightly LPO on the CT gantry and a pre procedural CT was performed demonstrating significant reduction, near complete resolution of residual trace right-sided pleural effusion with minimal amount of fluid seen at the level of the right lung apex and posteromedial aspect of the right lung base, too small to warrant CT-guided chest tube placement. Above was discussed with referring cardiothoracic surgeon, Dr. Kipp Brood, the decision was made not to proceed with intervention at this time. IMPRESSION: Significant reduction/near resolution of residual trace right-sided effusion, too small to warrant CT-guided chest tube placement. No procedure performed. Electronically Signed   By: Sandi Mariscal M.D.   On: 11/02/2019 10:43   DG CHEST PORT 1 VIEW  Result Date: 11/02/2019 CLINICAL DATA:  Pleural effusion EXAM: PORTABLE CHEST 1 VIEW COMPARISON:  10/31/2019 FINDINGS: Prior median sternotomy and CABG. Heart is normal size. Bibasilar airspace opacities, right greater than left with some improvement on the right since prior study. No effusions or pneumothorax. IMPRESSION: Bilateral lower lobe airspace opacities, right greater than left with some improvement in aeration at the right base since prior study. Electronically Signed   By: Rolm Baptise M.D.   On: 11/02/2019 11:22   DG CHEST PORT 1 VIEW  Result Date: 10/31/2019 CLINICAL DATA:  Shortness of breath EXAM: PORTABLE CHEST 1 VIEW COMPARISON:  October 29, 2019 chest radiograph and chest CT October 24, 2019 FINDINGS: There is a persistent fairly small right pleural effusion. There is ill-defined airspace opacity in the right base with apparent postoperative change in this area. Elsewhere, there is mild interstitial edema. There is cardiomegaly with pulmonary venous hypertension. Patient is status  post internal mammary bypass grafting. No adenopathy. There is calcification in the aortic arch as well as in the left carotid artery. IMPRESSION: Cardiomegaly with pulmonary vascular congestion. Interstitial pulmonary edema with right pleural effusion. These are findings felt to be indicative of a degree of underlying congestive heart failure. There is superimposed airspace opacity in the right base which may represent alveolar edema or a degree of superimposed pneumonia. Postoperative changes noted. There is left carotid artery calcification. Aortic Atherosclerosis (ICD10-I70.0). Electronically Signed   By: Lowella Grip III M.D.   On: 10/31/2019 08:11   DG CHEST PORT 1 VIEW  Result Date: 10/29/2019 CLINICAL DATA:  Shortness of breath, respiratory failure, pleural effusion EXAM: PORTABLE CHEST 1 VIEW COMPARISON:  CTA chest dated 10/24/2019 FINDINGS: Small to moderate right pleural effusion, possibly mildly improved. Associated right lower lobe opacity, likely atelectasis. Postsurgical changes in the right upper hemithorax. Left lung is clear. No frank interstitial edema. Cardiomegaly. Postsurgical changes related to prior CABG. Median sternotomy. IMPRESSION: Small to moderate right pleural effusion, possibly mildly improved. Postsurgical changes in the right upper hemithorax. Electronically Signed   By: Julian Hy M.D.   On: 10/29/2019 08:16   DG Chest Portable 1 View  Result Date: 10/24/2019 CLINICAL DATA:  Shortness of breath with fever and chills EXAM: PORTABLE CHEST 1 VIEW COMPARISON:  May 25, 2019 FINDINGS: There is a small right pleural effusion. There is postoperative change with scarring on the right. There is  no edema or airspace opacity. Heart is upper normal in size with pulmonary vascularity normal. Patient is status post internal mammary bypass grafting. No adenopathy. No bone lesions. IMPRESSION: Small right pleural effusion. Scarring with volume loss on the right. No frank edema  or airspace opacity. Stable cardiac silhouette. Postoperative changes noted. Electronically Signed   By: Lowella Grip III M.D.   On: 10/24/2019 13:32   ECHOCARDIOGRAM COMPLETE  Result Date: 11/04/2019    ECHOCARDIOGRAM REPORT   Patient Name:   THERESIA PREE Date of Exam: 11/04/2019 Medical Rec #:  [id number]    Height:       65.0 in Accession #:    [id number]   Weight:       182.2 lb Date of Birth:  [birth year]    BSA:          1.901 m Patient Age:    18 years     BP:           119/52 mmHg Patient Gender: F            HR:           90 bpm. Exam Location:  Inpatient Procedure: 2D Echo, Cardiac Doppler and Color Doppler Indications:    Aortic Stenosis 424.1/135.0  History:        Patient has prior history of Echocardiogram examinations, most                 recent 10/27/2019. CAD, Stroke and COPD; Signs/Symptoms:Shortness                 of Breath.  Sonographer:    Bernadene Person RDCS Referring Phys: [id number] Day  1. Left ventricular ejection fraction, by estimation, is 60 to 65%. The left ventricle has normal function. The left ventricle has no regional wall motion abnormalities. Left ventricular diastolic parameters are consistent with Grade II diastolic dysfunction (pseudonormalization). Elevated left ventricular end-diastolic pressure.  2. Right ventricular systolic function is normal. The right ventricular size is normal. There is normal pulmonary artery systolic pressure.  3. Left atrial size was moderately dilated.  4. The mitral valve is degenerative. Mild mitral valve regurgitation. No evidence of mitral stenosis. Moderate mitral annular calcification.  5. The aortic valve was not well visualized. There is moderate calcification of the aortic valve. There is severe thickening of the aortic valve. Aortic valve regurgitation is mild. Moderate to severe aortic valve stenosis. Aortic valve area, by VTI measures 1.03 cm. Aortic valve mean gradient measures 37.0 mmHg. Aortic valve Vmax  measures 3.93 m/s.  6. The inferior vena cava is normal in size with greater than 50% respiratory variability, suggesting right atrial pressure of 3 mmHg. FINDINGS  Left Ventricle: Left ventricular ejection fraction, by estimation, is 60 to 65%. The left ventricle has normal function. The left ventricle has no regional wall motion abnormalities. The left ventricular internal cavity size was normal in size. There is  no left ventricular hypertrophy. Left ventricular diastolic parameters are consistent with Grade II diastolic dysfunction (pseudonormalization). Elevated left ventricular end-diastolic pressure. Right Ventricle: The right ventricular size is normal. No increase in right ventricular wall thickness. Right ventricular systolic function is normal. There is normal pulmonary artery systolic pressure. The tricuspid regurgitant velocity is 2.64 m/s, and  with an assumed right atrial pressure of 3 mmHg, the estimated right ventricular systolic pressure is 62.9 mmHg. Left Atrium: Left atrial size was moderately dilated. Right Atrium: Right atrial size was normal in size.  Pericardium: There is no evidence of pericardial effusion. Mitral Valve: The mitral valve is degenerative in appearance. There is mild calcification of the anterior mitral valve leaflet(s). Moderate mitral annular calcification. Mild mitral valve regurgitation. No evidence of mitral valve stenosis. Tricuspid Valve: The tricuspid valve is normal in structure. Tricuspid valve regurgitation is mild . No evidence of tricuspid stenosis. Aortic Valve: The aortic valve was not well visualized. There is moderate calcification of the aortic valve. There is severe thickening of the aortic valve. Aortic valve regurgitation is mild. Moderate to severe aortic stenosis is present. Aortic valve mean gradient measures 37.0 mmHg. Aortic valve peak gradient measures 61.8 mmHg. Aortic valve area, by VTI measures 1.03 cm. Pulmonic Valve: The pulmonic valve was  normal in structure. Pulmonic valve regurgitation is not visualized. No evidence of pulmonic stenosis. Aorta: The aortic root is normal in size and structure. Venous: The inferior vena cava is normal in size with greater than 50% respiratory variability, suggesting right atrial pressure of 3 mmHg. IAS/Shunts: No atrial level shunt detected by color flow Doppler.  LEFT VENTRICLE PLAX 2D LVIDd:         4.70 cm  Diastology LVIDs:         3.30 cm  LV e' medial:    6.40 cm/s LV PW:         0.90 cm  LV E/e' medial:  18.3 LV IVS:        0.80 cm  LV e' lateral:   10.30 cm/s LVOT diam:     2.10 cm  LV E/e' lateral: 11.4 LV SV:         82 LV SV Index:   43 LVOT Area:     3.46 cm  RIGHT VENTRICLE RV S prime:     8.55 cm/s TAPSE (M-mode): 1.8 cm LEFT ATRIUM             Index       RIGHT ATRIUM           Index LA diam:        4.50 cm 2.37 cm/m  RA Area:     14.60 cm LA Vol (A2C):   75.9 ml 39.92 ml/m RA Volume:   33.10 ml  17.41 ml/m LA Vol (A4C):   84.4 ml 44.39 ml/m LA Biplane Vol: 79.9 ml 42.02 ml/m  AORTIC VALVE AV Area (Vmax):    0.93 cm AV Area (Vmean):   0.91 cm AV Area (VTI):     1.03 cm AV Vmax:           393.00 cm/s AV Vmean:          264.750 cm/s AV VTI:            0.800 m AV Peak Grad:      61.8 mmHg AV Mean Grad:      37.0 mmHg LVOT Vmax:         106.00 cm/s LVOT Vmean:        69.500 cm/s LVOT VTI:          0.238 m LVOT/AV VTI ratio: 0.30  AORTA Ao Root diam: 2.80 cm Ao Asc diam:  2.70 cm MITRAL VALVE                TRICUSPID VALVE MV Area (PHT): 2.99 cm     TR Peak grad:   27.9 mmHg MV Decel Time: 254 msec     TR Vmax:        264.00 cm/s MV  E velocity: 117.00 cm/s MV A velocity: 112.00 cm/s  SHUNTS MV E/A ratio:  1.04         Systemic VTI:  0.24 m                             Systemic Diam: 2.10 cm Fransico Him MD Electronically signed by Fransico Him MD Signature Date/Time: 11/04/2019/3:04:04 PM    Final    CT IMAGE GUIDED DRAINAGE BY PERCUTANEOUS CATHETER  Result Date: 11/02/2019 INDICATION:  Loculated right-sided pleural effusion. Please perform image guided chest tube placement for infection source control purposes. Note, there is attempt earlier this week to perform image guided chest tube placement however the patient could not tolerate the examination secondary to altered mental status. EXAM: LIMITED CHEST CT COMPARISON:  Chest CT-10/24/2019 MEDICATIONS: None ANESTHESIA/SEDATION: None CONTRAST:  None COMPLICATIONS: None immediate. PROCEDURE: Informed written consent was obtained from the patient after a discussion of the risks, benefits and alternatives to treatment. The patient was placed supine, slightly LPO on the CT gantry and a pre procedural CT was performed demonstrating significant reduction, near complete resolution of residual trace right-sided pleural effusion with minimal amount of fluid seen at the level of the right lung apex and posteromedial aspect of the right lung base, too small to warrant CT-guided chest tube placement. Above was discussed with referring cardiothoracic surgeon, Dr. Kipp Brood, the decision was made not to proceed with intervention at this time. IMPRESSION: Significant reduction/near resolution of residual trace right-sided effusion, too small to warrant CT-guided chest tube placement. No procedure performed. Electronically Signed   By: Sandi Mariscal M.D.   On: 11/02/2019 10:43   VAS [place] LOWER EXTREMITY VENOUS (DVT)  Result Date: 10/29/2019  Lower Venous DVTStudy Indications: Pulmonary embolism, and See Previous Cat Scan.  Risk Factors: Suspected PE. Limitations: Limited due to patient movement. Performing Technologist: Griffin Basil RCT RDMS  Examination Guidelines: A complete evaluation includes B-mode imaging, spectral Doppler, color Doppler, and power Doppler as needed of all accessible portions of each vessel. Bilateral testing is considered an integral part of a complete examination. Limited examinations for reoccurring indications may be performed as  noted. The reflux portion of the exam is performed with the patient in reverse Trendelenburg.  +---------+---------------+---------+-----------+----------+--------------+ RIGHT    CompressibilityPhasicitySpontaneityPropertiesThrombus Aging +---------+---------------+---------+-----------+----------+--------------+ CFV      Full           Yes      Yes                                 +---------+---------------+---------+-----------+----------+--------------+ SFJ      Full                                                        +---------+---------------+---------+-----------+----------+--------------+ FV Prox  Full                                                        +---------+---------------+---------+-----------+----------+--------------+ FV Mid   Full                                                        +---------+---------------+---------+-----------+----------+--------------+  FV DistalFull                                                        +---------+---------------+---------+-----------+----------+--------------+ PFV      Full                                                        +---------+---------------+---------+-----------+----------+--------------+ POP      Full                    Yes                                 +---------+---------------+---------+-----------+----------+--------------+ PTV      Full                                                        +---------+---------------+---------+-----------+----------+--------------+ PERO     Full                                                        +---------+---------------+---------+-----------+----------+--------------+   +---------+---------------+---------+-----------+----------+------------------+ LEFT     CompressibilityPhasicitySpontaneityPropertiesThrombus Aging     +---------+---------------+---------+-----------+----------+------------------+ CFV      Full            Yes      Yes                                     +---------+---------------+---------+-----------+----------+------------------+ SFJ      Full                                                            +---------+---------------+---------+-----------+----------+------------------+ FV Prox  Full                                                            +---------+---------------+---------+-----------+----------+------------------+ FV Mid   Full                                                            +---------+---------------+---------+-----------+----------+------------------+ FV DistalFull                                                            +---------+---------------+---------+-----------+----------+------------------+  PFV      Full                                                            +---------+---------------+---------+-----------+----------+------------------+ POP      Full                                                            +---------+---------------+---------+-----------+----------+------------------+ PTV      Full                                                            +---------+---------------+---------+-----------+----------+------------------+ PERO     Full                                         Limited                                                                  visualization      +---------+---------------+---------+-----------+----------+------------------+     Summary: RIGHT: - There is no evidence of deep vein thrombosis in the lower extremity.  - No cystic structure found in the popliteal fossa.  LEFT: - There is no evidence of deep vein thrombosis in the lower extremity.  - No cystic structure found in the popliteal fossa.  *See table(s) above for measurements and observations. Electronically signed by Servando Snare MD on 10/29/2019 at 1:01:37 PM.    Final    ECHOCARDIOGRAM LIMITED  Result  Date: 10/27/2019    ECHOCARDIOGRAM LIMITED REPORT   Patient Name:   SHARESE MANRIQUE Date of Exam: 10/27/2019 Medical Rec #:  [id number]    Height:       65.0 in Accession #:    [id number]   Weight:       196.6 lb Date of Birth:  1955/09/03    BSA:          1.964 m Patient Age:    89 years     BP:           148/66 mmHg Patient Gender: F            HR:           84 bpm. Exam Location:  Inpatient Procedure: Limited Echo, Limited Color Doppler and Cardiac Doppler Indications:     Dyspnea 786.09 / R06.00  History:         Patient has prior history of Echocardiogram examinations, most                  recent 08/10/2019. CAD; Prior CABG. Previous Myocardial  Infarction, Prior                  CABG, COPD; Signs/Symptoms:Shortness of Breath. 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                  Parapneumonic right-sided effusion.  Sonographer:     Darlina Sicilian RDCS Referring Phys:  [id number] COURAGE EMOKPAE Diagnosing Phys: Fransico Him MD  Sonographer Comments: Image acquisition challenging due to patient behavioral factors. IMPRESSIONS  1. Left ventricular endocardial border not optimally defined to evaluate regional wall motion. Left ventricular diastolic function could not be evaluated.Patient was not cooperative with exam.  2. Right ventricular systolic function was not well visualized. The right ventricular size is not well visualized.  3. The mitral valve is normal in structure. Mild mitral valve regurgitation. No evidence of mitral stenosis. Moderate mitral annular calcification.  4. The aortic valve is calcified. There is severe calcifcation of the aortic valve. There is moderate thickening of the aortic valve. Aortic valve regurgitation is not visualized. There is likely severe AS but study not sufficient for adequate assessment.  5. Recommend repeat 2D echo full study once patient able to cooperate. FINDINGS  Left Ventricle: Left ventricular endocardial  border not optimally defined to evaluate regional wall motion. The left ventricular internal cavity size was normal in size. There is no left ventricular hypertrophy. Left ventricular diastolic function could  not be evaluated. Right Ventricle: The right ventricular size is not well visualized. Right vetricular wall thickness was not assessed. Right ventricular systolic function was not well visualized. Left Atrium: Left atrial size was not well visualized. Right Atrium: Right atrial size was not well visualized. Pericardium: There is no evidence of pericardial effusion. Mitral Valve: The mitral valve is normal in structure. Moderate mitral annular calcification. Mild mitral valve regurgitation. No evidence of mitral valve stenosis. Tricuspid Valve: The tricuspid valve is normal in structure. Tricuspid valve regurgitation is mild . No evidence of tricuspid stenosis. Aortic Valve: The aortic valve is calcified. There is severe calcifcation of the aortic valve. There is moderate thickening of the aortic valve. Aortic valve regurgitation is not visualized. There is likely severe AS but study not sufficient for adequate  assessment. Pulmonic Valve: The pulmonic valve was not assessed. Aorta: The aortic root is normal in size and structure. IAS/Shunts: No atrial level shunt detected by color flow Doppler. LEFT VENTRICLE PLAX 2D LVOT diam:     1.90 cm LVOT Area:     2.84 cm   AORTA Ao Root diam: 2.80 cm  SHUNTS Systemic Diam: 1.90 cm Fransico Him MD Electronically signed by Fransico Him MD Signature Date/Time: 10/27/2019/2:08:25 PM    Final (Updated)    [place] EKG SITE RITE  Result Date: 10/28/2019 If Site Rite image not attached, placement could not be confirmed due to current cardiac rhythm.  CT Maxillofacial WO CM  Result Date: 11/12/2019 CLINICAL DATA:  Poly trauma, critical, head/cervical spine injury suspected. Facial trauma. Additional history provided: Fall, laceration to bottom lip, multiple bruises, patient  also reports falling yesterday. EXAM: CT HEAD WITHOUT CONTRAST CT MAXILLOFACIAL WITHOUT CONTRAST CT CERVICAL SPINE WITHOUT CONTRAST TECHNIQUE: Multidetector CT imaging of the head, cervical spine, and maxillofacial structures were performed using the standard protocol without intravenous contrast. Multiplanar CT image reconstructions of the cervical spine and maxillofacial structures were also generated. COMPARISON:  Head CT  10/25/2019. FINDINGS: CT HEAD FINDINGS Brain: Stable, mild generalized cerebral atrophy. Stable, mild patchy hypoattenuation within the cerebral white matter which is nonspecific, but consistent with chronic small vessel ischemic disease. There is acute parafalcine subdural hemorrhage measuring up to 4 mm (for instance as seen on series 2, image 26) (series 4, image 35). Trace acute subdural hemorrhage is also questioned along the left tentorium (series 2, images 11 and 12). No demarcated cortical infarct is identified. No evidence of intracranial mass. No midline shift. Vascular: No hyperdense vessel. Skull: Normal. Negative for fracture or focal lesion. Other: Bilateral mastoid effusions. CT MAXILLOFACIAL FINDINGS Osseous: Motion degradation limits evaluation of the mandibular body. No acute maxillofacial fracture is identified. Orbits: No acute finding. The globes are normal in size and contour. The extraocular muscles and optic nerve sheath complexes are symmetric and unremarkable. Sinuses: Mild ethmoid and right sphenoid sinus mucosal thickening. Soft tissues: Midline forehead and right maxillofacial soft tissue swelling/hematoma. Incidentally noted anterior nasal septal defect (series 8, image 19). CT CERVICAL SPINE FINDINGS Alignment: A mild cervical levocurvature may be positional. Straightening of the expected cervical lordosis. Trace C5-C6 grade 1 retrolisthesis Skull base and vertebrae: The basion-dental and atlanto-dental intervals are maintained.No evidence of acute fracture to the  cervical spine. Soft tissues and spinal canal: No prevertebral fluid or swelling. No visible canal hematoma. Disc levels: Cervical spondylosis with multilevel disc space narrowing, posterior disc osteophytes, uncovertebral and facet hypertrophy. These findings are greatest at C5-C6, C6-C7 and C7-T1. Upper chest: No consolidation within the imaged lung apices. No visible pneumothorax. These results were called by telephone at the time of interpretation on 11/12/2019 at 6:16 pm to provider Carmin Muskrat , who verbally acknowledged these results. IMPRESSION: CT head: 1. Acute parafalcine subdural hemorrhage measuring up to 4 mm. Trace acute subdural hemorrhage is also questioned along the left tentorium. 2. Stable mild generalized cerebral atrophy and chronic small vessel ischemic disease. 3. Bilateral mastoid effusions. CT maxillofacial 1. Evaluation of the mandibular body is limited due to motion degradation. 2. No acute maxillofacial fracture is identified. 3. Midline forehead and right maxillofacial soft tissue swelling/hematoma. 4. Mild ethmoid and right sphenoid sinus mucosal thickening. 5. Incidentally noted anterior nasal septal defect. CT cervical spine: 1. No evidence of acute fracture to the cervical spine. 2. Trace C5-C6 grade 1 retrolisthesis. 3. Cervical spondylosis as described and greatest at C5-C6, C6-C7 and C7-T1. Electronically Signed   By: Kellie Simmering DO   On: 11/12/2019 18:17    Orson Eva, DO  [facility]  If 7PM-7AM, please contact night-coverage [email or web address] Password [facility] 11/13/2019, 6:58 AM   LOS: 0 days

## 2019-11-13 NOTE — Consult Note (Addendum)
Cardiology Consultation:   Patient ID: Emily Rocha MRN: [id number]; DOB: 1955/04/26  Admit date: 11/12/2019 Date of Consult: 11/13/2019  Primary Care Provider: No primary care provider on file. [facility] Cardiologist: Carlyle Dolly, MD   [facility] Electrophysiologist:  None     Patient Profile:   Emily Rocha is a 64 y.o. female with a hx ofCAD who is being seen today for the evaluation of syncope and SDH at the request of Dr. Carles Collet.  History of Present Illness:   Emily Rocha is a 64 year old female patient with history of CAD status post NSTEMI 62/8315 complicated by cardiogenic shock followed by CABG with LIMA to the LAD, SVG to the circumflex SVG to diagonal.  LVEF 45 to 50% 12/2016 but normalized on echo 2019.  NST 10/2018 no ischemia, history of TIAs status post right subclavian and right common carotid bypass 12/2017,  Moderate to severe aortic stenosis,hx of lung cancer status post wedge resection, COPD on home O2.  Patient was discharged from [facility] 11/06/2019 after being treated for acute hypoxic respiratory failure secondary to an pneumonia complicated by right-sided pleural effusion loculated and possible empyema and COPD, acute pulmonary artery thrombus treated with IV heparin followed by Eliquis, and hospital-acquired delirium due to prolonged hospitalization.  Patient returned to the hospital yesterday with multiple syncopal episodes at which time she broke her dentures and was laying on the floor for approximately 4 hours.  CT 11/12/2019 showed a subdural hematoma.  Neurosurgery recommended discontinuing Eliquis nonoperative management and repeat CT today.  Patient currently awake and alert in ER. She says she got up to go to the bathroom, had no dizziness, palpitations, chest pains or any warning signs before she passed out. She say she came around quickly but couldn't get up and it took about 4 hrs before her husband found her.    Past Medical History:  Diagnosis Date    . Anxiety   . Asthma   . COPD (chronic obstructive pulmonary disease) ([place])   . Coronary artery disease    hx CABG  . GERD (gastroesophageal reflux disease)   . Peripheral vascular disease ([place])   . Pneumonia   . Shortness of breath dyspnea   . Stroke [facility])    "mini stroke" numbness and weakness to left side of the body    Past Surgical History:  Procedure Laterality Date  . AORTIC ARCH ANGIOGRAPHY N/A 11/04/2017   Procedure: AORTIC ARCH ANGIOGRAPHY;  Surgeon: Elam Dutch, MD;  Location: [place] [facility];  Service: Cardiovascular;  Laterality: N/A;  . APPLICATION OF WOUND VAC N/A 01/06/2018   Procedure: APPLICATION OF WOUND VAC;  Surgeon: Ivin Poot, MD;  Location: [place];  Service: Thoracic;  Laterality: N/A;  . APPLICATION OF WOUND VAC N/A 01/13/2018   Procedure: WOUND VAC CHANGE;  Surgeon: Ivin Poot, MD;  Location: [place];  Service: Thoracic;  Laterality: N/A;  . CARDIAC CATHETERIZATION    . CAROTID-SUBCLAVIAN BYPASS GRAFT Right 12/26/2017   Procedure: AORTIC TO RIGHT COMMON CAROTID AND RIGHT SUBCLAVIAN  ARTERY  BYPASS;  Surgeon: Elam Dutch, MD;  Location: [place];  Service: Vascular;  Laterality: Right;  . CHOLECYSTECTOMY    . CLOSURE OF DIAPHRAGM  12/26/2017   Procedure: REPAIR OF DIAPHRAGM;  Surgeon: Ivin Poot, MD;  Location: [place];  Service: Thoracic;;  . COLONOSCOPY N/A 10/03/2014   Procedure: COLONOSCOPY;  Surgeon: Rogene [place], MD;  [facility];  Service: Endoscopy;  Laterality: N/A;  730  . CORONARY ARTERY BYPASS GRAFT N/A 12/14/2016   Procedure: CORONARY ARTERY BYPASS GRAFTING (CABG) x three , using left internal mammary artery and right leg greater saphenous vein harvested endoscopically;  Surgeon: Ivin Poot, MD;  Location: [place];  Service: Open Heart Surgery;  Laterality: N/A;  . ENDARTERECTOMY Right 12/06/2014   Procedure: ENDARTERECTOMY CAROTid;  Surgeon: Elam Dutch, MD;  Location: [facility];  Service: Vascular;   Laterality: Right;  . ESOPHAGOGASTRODUODENOSCOPY N/A 12/03/2016   Procedure: ESOPHAGOGASTRODUODENOSCOPY (EGD);  Surgeon: Rogene [place], MD;  [facility];  Service: Endoscopy;  Laterality: N/A;  7:30  . LEFT HEART CATH AND CORONARY ANGIOGRAPHY N/A 12/14/2016   Procedure: LEFT HEART CATH AND CORONARY ANGIOGRAPHY;  Surgeon: Burnell Blanks, MD;  Location: [place] [facility];  Service: Cardiovascular;  Laterality: N/A;  . LOBECTOMY Right 10/26/2018   Procedure: RIGHT UPPER LOBECTOMY;  Surgeon: Lajuana Matte, MD;  Location: [place];  Service: Thoracic;  Laterality: Right;  . PERIPHERAL VASCULAR CATHETERIZATION N/A 12/05/2014   Procedure:  Carotid Arch Angiography;  Surgeon: Conrad Grant, MD;  Location: [place] [facility];  Service: Cardiovascular;  Laterality: N/A;  . STERNAL WOUND DEBRIDEMENT N/A 01/06/2018   Procedure: STERNAL WOUND DEBRIDEMENT;  Surgeon: Ivin Poot, MD;  Location: [place];  Service: Thoracic;  Laterality: N/A;  . STERNOTOMY N/A 12/26/2017   Procedure: REDO STERNOTOMY;  Surgeon: Prescott Gum, Collier Salina, MD;  Location: [place];  Service: Thoracic;  Laterality: N/A;  . TEE WITHOUT CARDIOVERSION N/A 12/14/2016   Procedure: TRANSESOPHAGEAL ECHOCARDIOGRAM (TEE);  Surgeon: Prescott Gum, Collier Salina, MD;  Location: [place];  Service: Open Heart Surgery;  Laterality: N/A;  . VIDEO ASSISTED THORACOSCOPY (VATS)/WEDGE RESECTION Right 10/26/2018   Procedure: VIDEO ASSISTED THORACOSCOPY (VATS)/WEDGE RESECTION;  Surgeon: Lajuana Matte, MD;  Location: [place];  Service: Thoracic;  Laterality: Right;  Marland Kitchen VIDEO BRONCHOSCOPY N/A 10/26/2018   Procedure: VIDEO BRONCHOSCOPY;  Surgeon: Lajuana Matte, MD;  Location: [facility];  Service: Thoracic;  Laterality: N/A;     Home Medications:  Prior to Admission medications   Medication Sig Start Date End Date Taking? Authorizing Provider  albuterol (PROVENTIL) (2.5 MG/3ML) 0.083% nebulizer solution Take 2.5 mg by nebulization every 4 (four)  hours as needed. 03/30/19  Yes [provider]  ALPRAZolam Duanne Moron) 0.5 MG tablet Take 0.5 mg by mouth 3 (three) times daily.    Yes [provider]  APIXABAN [name]) VTE STARTER PACK (10MG  AND 5MG ) Take as directed on package: start with two-5mg  tablets twice daily for 7 days. On day 8, switch to one-5mg  tablet twice daily. 11/06/19  Yes Oretha Milch D, MD  Ascorbic Acid (VITAMIN C GUMMIE) 120 MG CHEW Chew 500 mg by mouth.   Yes [provider]  aspirin 81 MG chewable tablet Chew 81 mg by mouth daily.   Yes [provider]  atorvastatin (LIPITOR) 40 MG tablet Take 1 tablet (40 mg total) daily by mouth. 12/27/16  Yes Gold, Wayne E, PA-C  calcium carbonate (OS-CAL) 1250 (500 Ca) MG chewable tablet Chew 1 tablet by mouth daily.   Yes [provider]  cholecalciferol (VITAMIN D3) 25 MCG (1000 UNIT) tablet Take 1,000 Units by mouth daily.   Yes [provider]  diphenhydramine-acetaminophen (TYLENOL PM) 25-500 MG TABS tablet Take 2-3 tablets by mouth at bedtime.   Yes [provider]  guaiFENesin (MUCINEX) 600 MG 12 hr tablet Take 1 tablet (600 mg total) by mouth 2 (two)  times daily as needed. 11/06/19  Yes Oretha Milch D, MD  isosorbide mononitrate (IMDUR) 30 MG 24 hr tablet Take 0.5 tablets (15 mg total) by mouth daily. 03/21/19  Yes BranchAlphonse Guild, MD  metoprolol succinate (TOPROL-XL) 50 MG 24 hr tablet Take 50 mg by mouth daily. Take with or immediately following a meal.   Yes [provider]  Multiple Vitamin (MULTIVITAMIN WITH MINERALS) TABS tablet Take 1 tablet by mouth daily. 12/01/18  Yes Lars Pinks M, PA-C  pantoprazole (PROTONIX) 40 MG tablet Take 40 mg by mouth daily.   Yes [provider]  potassium chloride SA (K-DUR,KLOR-CON) 20 MEQ tablet Take 20 mEq by mouth daily.  10/20/17  Yes [provider]  QUEtiapine (SEROQUEL) 25 MG tablet Take 1 tablet (25 mg total) by mouth at bedtime. 11/06/19   Yes Oretha Milch D, MD  Tiotropium Bromide-Olodaterol (STIOLTO RESPIMAT) 2.5-2.5 MCG/ACT AERS Inhale 2 puffs into the lungs daily. 11/06/19  Yes Oretha Milch D, MD  torsemide (DEMADEX) 20 MG tablet Take 1 tablet (20 mg total) by mouth daily. 11/01/18  Yes Nani Skillern, PA-C    Inpatient Medications: Scheduled Meds: . atorvastatin  40 mg Oral Daily  . budesonide (PULMICORT) nebulizer solution  0.5 mg Nebulization BID  . feeding supplement (ENSURE ENLIVE)  237 mL Oral BID BM  . ipratropium-albuterol  3 mL Nebulization Q6H  . pantoprazole  40 mg Oral Daily   Continuous Infusions:  PRN Meds: acetaminophen **OR** acetaminophen, albuterol, ALPRAZolam, guaiFENesin  Allergies:   No Known Allergies  Social History:   Social History   Socioeconomic History  . Marital status: Married    Spouse name: Not on file  . Number of children: Not on file  . Years of education: Not on file  . Highest education level: Not on file  Occupational History  . Not on file  Tobacco Use  . Smoking status: Former Smoker    Packs/day: 2.00    Years: 40.00    Pack years: 80.00    Quit date: 10/24/2015    Years since quitting: 4.0  . Smokeless tobacco: Never Used  Vaping Use  . Vaping Use: Former  . Start date: 10/23/2013  . Quit date: 10/24/2015  Substance and Sexual Activity  . Alcohol use: No    Alcohol/week: 0.0 standard drinks  . Drug use: No  . Sexual activity: Not on file  Other Topics Concern  . Not on file  Social History Narrative  . Not on file   Social Determinants of Health   Financial Resource Strain:   . Difficulty of Paying Living Expenses: Not on file  Food Insecurity:   . Worried About Charity fundraiser in the Last Year: Not on file  . Ran Out of Food in the Last Year: Not on file  Transportation Needs:   . Lack of Transportation (Medical): Not on file  . Lack of Transportation (Non-Medical): Not on file  Physical Activity:   . Days of Exercise per Week: Not on  file  . Minutes of Exercise per Session: Not on file  Stress:   . Feeling of Stress : Not on file  Social Connections:   . Frequency of Communication with Friends and Family: Not on file  . Frequency of Social Gatherings with Friends and Family: Not on file  . Attends Religious Services: Not on file  . Active Member of Clubs or Organizations: Not on file  . Attends Archivist Meetings: Not on  file  . Marital Status: Not on file  Intimate Partner Violence:   . Fear of Current or Ex-Partner: Not on file  . Emotionally Abused: Not on file  . Physically Abused: Not on file  . Sexually Abused: Not on file    Family History:     Family History  Problem Relation Age of Onset  . Heart attack Mother   . Cancer Mother   . Diabetes Mother   . Heart disease Mother   . Heart attack Father   . Heart disease Father      ROS:  Please see the history of present illness.  Review of Systems  Constitutional: Positive for malaise/fatigue.  HENT: Negative.   Eyes: Negative.   Cardiovascular: Positive for dyspnea on exertion and syncope.  Respiratory: Positive for cough, shortness of breath and wheezing.   Hematologic/Lymphatic: Negative.   Musculoskeletal: Negative.  Negative for joint pain.  Gastrointestinal: Negative.   Genitourinary: Negative.   Neurological: Positive for weakness.    All other ROS reviewed and negative.     Physical Exam/Data:   Vitals:   11/13/19 0430 11/13/19 0600 11/13/19 0630 11/13/19 0700  BP: (!) 107/55 (!) 101/59 109/60 111/66  Pulse: 93 89 92 95  Resp: (!) 24 (!) 22 (!) 21 20  SpO2: 98% 95% 96% 94%  Weight:      Height:       No intake or output data in the 24 hours ending 11/13/19 0801 Last 3 Weights 11/12/2019 11/06/2019 11/05/2019  Weight (lbs) 178 lb 9.2 oz 178 lb 12.7 oz 176 lb 12.8 oz  Weight (kg) 81 kg 81.1 kg 80.196 kg     Body mass index is 29.72 kg/m.  General:  Well nourished, well developed, in no acute distress HEENT:  normal Lymph: no adenopathy Neck: no JVD Endocrine:  No thryomegaly Vascular: No carotid bruits; FA pulses 2+ bilaterally without bruits  Cardiac:  normal S1, S2; RRR; 3/6 systolic murmur LSB Lungs: decreased breath sounds right lung base, diffuse wheezing and rhonchi throughout Abd: soft, nontender, no hepatomegaly  Ext: no edema Musculoskeletal:  No deformities, BUE and BLE strength normal and equal Skin: warm and dry  Neuro:  CNs 2-12 intact, no focal abnormalities noted Psych:  Normal affect   EKG:  The EKG was personally reviewed and demonstrates:  NSR nonspecific ST changes no acute change Telemetry:  Telemetry was personally reviewed and demonstrates:  NSR  Relevant CV Studies: 2D echo 11/04/2019 IMPRESSIONS     1. Left ventricular ejection fraction, by estimation, is 60 to 65%. The  left ventricle has normal function. The left ventricle has no regional  wall motion abnormalities. Left ventricular diastolic parameters are  consistent with Grade II diastolic  dysfunction (pseudonormalization). Elevated left ventricular end-diastolic  pressure.   2. Right ventricular systolic function is normal. The right ventricular  size is normal. There is normal pulmonary artery systolic pressure.   3. Left atrial size was moderately dilated.   4. The mitral valve is degenerative. Mild mitral valve regurgitation. No  evidence of mitral stenosis. Moderate mitral annular calcification.   5. The aortic valve was not well visualized. There is moderate  calcification of the aortic valve. There is severe thickening of the  aortic valve. Aortic valve regurgitation is mild. Moderate to severe  aortic valve stenosis. Aortic valve area, by VTI  measures 1.03 cm. Aortic valve mean gradient measures 37.0 mmHg. Aortic  valve Vmax measures 3.93 m/s.   6. The inferior vena  cava is normal in size with greater than 50%  respiratory variability, suggesting right atrial pressure of 3 mmHg.   FINDINGS    Left Ventricle: Left ventricular ejection fraction, by estimation, is 60  to 65%. The left ventricle has normal function. The left ventricle has no  regional wall motion abnormalities. The left ventricular internal cavity  size was normal in size. There is   no left ventricular hypertrophy. Left ventricular diastolic parameters  are consistent with Grade II diastolic dysfunction (pseudonormalization).  Elevated left ventricular end-diastolic pressure.   Right Ventricle: The right ventricular size is normal. No increase in  right ventricular wall thickness. Right ventricular systolic function is  normal. There is normal pulmonary artery systolic pressure. The tricuspid  regurgitant velocity is 2.64 m/s, and   with an assumed right atrial pressure of 3 mmHg, the estimated right  ventricular systolic pressure is 63.3 mmHg.   Left Atrium: Left atrial size was moderately dilated.   Right Atrium: Right atrial size was normal in size.   Pericardium: There is no evidence of pericardial effusion.   Mitral Valve: The mitral valve is degenerative in appearance. There is  mild calcification of the anterior mitral valve leaflet(s). Moderate  mitral annular calcification. Mild mitral valve regurgitation. No evidence  of mitral valve stenosis.   Tricuspid Valve: The tricuspid valve is normal in structure. Tricuspid  valve regurgitation is mild . No evidence of tricuspid stenosis.   Aortic Valve: The aortic valve was not well visualized. There is moderate  calcification of the aortic valve. There is severe thickening of the  aortic valve. Aortic valve regurgitation is mild. Moderate to severe  aortic stenosis is present. Aortic valve  mean gradient measures 37.0 mmHg. Aortic valve peak gradient measures 61.8  mmHg. Aortic valve area, by VTI measures 1.03 cm.   Pulmonic Valve: The pulmonic valve was normal in structure. Pulmonic valve  regurgitation is not visualized. No evidence of pulmonic  stenosis.   Aorta: The aortic root is normal in size and structure.   Venous: The inferior vena cava is normal in size with greater than 50%  respiratory variability, suggesting right atrial pressure of 3 mmHg.   IAS/Shunts: No atrial level shunt detected by color flow Doppler.     2D echo 07/2019 IMPRESSIONS     1. Left ventricular ejection fraction, by estimation, is 60 to 65%. The  left ventricle has normal function. The left ventricle has no regional  wall motion abnormalities. There is mild left ventricular hypertrophy.  Left ventricular diastolic parameters  are consistent with Grade I diastolic dysfunction (impaired relaxation).   2. Right ventricular systolic function is mildly reduced. The right  ventricular size is normal. There is normal pulmonary artery systolic  pressure. The estimated right ventricular systolic pressure is 35.4 mmHg.   3. Left atrial size was mildly dilated.   4. The mitral valve is degenerative. Moderate annular calcification which  impinges on LVOT as well. Mild mitral valve regurgitation.   5. The aortic valve is tricuspid, severely calcified and with  significantly reduced cusp excursion. Aortic valve regurgitation is  trivial. Moderate to severe aortic valve stenosis (upper end of scale).  Aortic valve area, by VTI measures 0.82 cm. Aortic   valve mean gradient measures 27.0 mmHg. Aortic valve Vmax measures 3.31  m/s. Dimentionless index 0.24.   6. The inferior vena cava is normal in size with greater than 50%  respiratory variability, suggesting right atrial pressure of 3 mmHg.   FINDINGS  Left Ventricle: Left ventricular ejection fraction, by estimation, is 60  to 65%. The left ventricle has normal function. The left ventricle has no  regional wall motion abnormalities. The left ventricular internal cavity  size was normal in size. There is   mild left ventricular hypertrophy. Left ventricular diastolic parameters  are consistent with  Grade I diastolic dysfunction (impaired relaxation).   Right Ventricle: The right ventricular size is normal. No increase in  right ventricular wall thickness. Right ventricular systolic function is  mildly reduced. There is normal pulmonary artery systolic pressure. The  tricuspid regurgitant velocity is 2.41  m/s, and with an assumed right atrial pressure of 3 mmHg, the estimated  right ventricular systolic pressure is 76.7 mmHg.   Left Atrium: Left atrial size was mildly dilated.   Right Atrium: Right atrial size was normal in size.   Pericardium: There is no evidence of pericardial effusion.   Mitral Valve: The mitral valve is degenerative in appearance. There is  mild thickening of the mitral valve leaflet(s). Moderate mitral annular  calcification. Mild mitral valve regurgitation.   Tricuspid Valve: The tricuspid valve is grossly normal. Tricuspid valve  regurgitation is mild.   Aortic Valve: The aortic valve is tricuspid. Aortic valve regurgitation is  trivial. Moderate to severe aortic stenosis is present. Moderate to severe  aortic valve annular calcification. There is severe calcifcation of the  aortic valve. Aortic valve mean  gradient measures 27.0 mmHg. Aortic valve peak gradient measures 43.8  mmHg. Aortic valve area, by VTI measures 0.82 cm.   Pulmonic Valve: The pulmonic valve was not well visualized. Pulmonic valve  regurgitation is trivial.   Aorta: The aortic root is normal in size and structure.   Venous: The inferior vena cava is normal in size with greater than 50%  respiratory variability, suggesting right atrial pressure of 3 mmHg.   IAS/Shunts: No atrial level shunt detected by color flow Doppler.      Laboratory Data:  High Sensitivity Troponin:   Recent Labs  Lab 10/24/19 1316 10/24/19 1655  TROPONINIHS 79* 84*     Chemistry Recent Labs  Lab 11/12/19 1851 11/13/19 0330  NA 138 140  K 3.9 3.6  CL 98 100  CO2 34* 32  GLUCOSE 100* 98   BUN 6* 7*  CREATININE 0.39* 0.38*  CALCIUM 8.8* 8.7*  GFRNONAA >60 >60  GFRAA >60 >60  ANIONGAP 6 8    Recent Labs  Lab 11/12/19 1851 11/13/19 0330  PROT 6.3* 5.8*  ALBUMIN 3.0* 2.8*  AST 22 18  ALT 19 18  ALKPHOS 59 55  BILITOT 0.7 0.7   Hematology Recent Labs  Lab 11/12/19 1851 11/13/19 0330  WBC 5.2 4.2  RBC 3.56* 3.47*  HGB 9.9* 9.6*  HCT 33.1* 32.8*  MCV 93.0 94.5  MCH 27.8 27.7  MCHC 29.9* 29.3*  RDW 15.4 15.6*  PLT 196 193   BNP Recent Labs  Lab 11/12/19 1851  BNP 337.0*    DDimer No results for input(s): DDIMER in the last 168 hours.   Radiology/Studies:  DG Chest 2 View  Result Date: 11/12/2019 CLINICAL DATA:  Status post fall. EXAM: CHEST - 2 VIEW COMPARISON:  November 02, 2019 FINDINGS: Multiple sternal wires are seen. Mild, diffuse, chronic appearing increased interstitial lung markings are noted. Radiopaque surgical clips are seen along the medial aspect of the right apex. Ill-defined surgical sutures are seen overlying the right lung base. Mild right basilar atelectasis is also present. There is very mild  blunting of the right costophrenic angle. No pneumothorax is seen. The heart size and mediastinal contours are within normal limits. There is mild calcification of the aortic arch. Surgical clips are seen overlying the right upper quadrant. The visualized skeletal structures are unremarkable. IMPRESSION: 1. Evidence of prior median sternotomy/CABG. 2. Postoperative changes within the right lung base with mild right basilar atelectasis. 3. Very small right pleural effusion. Electronically Signed   By: Virgina Norfolk M.D.   On: 11/12/2019 18:08   CT Head Wo Contrast  Result Date: 11/12/2019 CLINICAL DATA:  Poly trauma, critical, head/cervical spine injury suspected. Facial trauma. Additional history provided: Fall, laceration to bottom lip, multiple bruises, patient also reports falling yesterday. EXAM: CT HEAD WITHOUT CONTRAST CT MAXILLOFACIAL  WITHOUT CONTRAST CT CERVICAL SPINE WITHOUT CONTRAST TECHNIQUE: Multidetector CT imaging of the head, cervical spine, and maxillofacial structures were performed using the standard protocol without intravenous contrast. Multiplanar CT image reconstructions of the cervical spine and maxillofacial structures were also generated. COMPARISON:  Head CT 10/25/2019. FINDINGS: CT HEAD FINDINGS Brain: Stable, mild generalized cerebral atrophy. Stable, mild patchy hypoattenuation within the cerebral white matter which is nonspecific, but consistent with chronic small vessel ischemic disease. There is acute parafalcine subdural hemorrhage measuring up to 4 mm (for instance as seen on series 2, image 26) (series 4, image 35). Trace acute subdural hemorrhage is also questioned along the left tentorium (series 2, images 11 and 12). No demarcated cortical infarct is identified. No evidence of intracranial mass. No midline shift. Vascular: No hyperdense vessel. Skull: Normal. Negative for fracture or focal lesion. Other: Bilateral mastoid effusions. CT MAXILLOFACIAL FINDINGS Osseous: Motion degradation limits evaluation of the mandibular body. No acute maxillofacial fracture is identified. Orbits: No acute finding. The globes are normal in size and contour. The extraocular muscles and optic nerve sheath complexes are symmetric and unremarkable. Sinuses: Mild ethmoid and right sphenoid sinus mucosal thickening. Soft tissues: Midline forehead and right maxillofacial soft tissue swelling/hematoma. Incidentally noted anterior nasal septal defect (series 8, image 19). CT CERVICAL SPINE FINDINGS Alignment: A mild cervical levocurvature may be positional. Straightening of the expected cervical lordosis. Trace C5-C6 grade 1 retrolisthesis Skull base and vertebrae: The basion-dental and atlanto-dental intervals are maintained.No evidence of acute fracture to the cervical spine. Soft tissues and spinal canal: No prevertebral fluid or  swelling. No visible canal hematoma. Disc levels: Cervical spondylosis with multilevel disc space narrowing, posterior disc osteophytes, uncovertebral and facet hypertrophy. These findings are greatest at C5-C6, C6-C7 and C7-T1. Upper chest: No consolidation within the imaged lung apices. No visible pneumothorax. These results were called by telephone at the time of interpretation on 11/12/2019 at 6:16 pm to provider Carmin Muskrat , who verbally acknowledged these results. IMPRESSION: CT head: 1. Acute parafalcine subdural hemorrhage measuring up to 4 mm. Trace acute subdural hemorrhage is also questioned along the left tentorium. 2. Stable mild generalized cerebral atrophy and chronic small vessel ischemic disease. 3. Bilateral mastoid effusions. CT maxillofacial 1. Evaluation of the mandibular body is limited due to motion degradation. 2. No acute maxillofacial fracture is identified. 3. Midline forehead and right maxillofacial soft tissue swelling/hematoma. 4. Mild ethmoid and right sphenoid sinus mucosal thickening. 5. Incidentally noted anterior nasal septal defect. CT cervical spine: 1. No evidence of acute fracture to the cervical spine. 2. Trace C5-C6 grade 1 retrolisthesis. 3. Cervical spondylosis as described and greatest at C5-C6, C6-C7 and C7-T1. Electronically Signed   By: Kellie Simmering DO   On: 11/12/2019 18:17  CT Cervical Spine Wo Contrast  Result Date: 11/12/2019 CLINICAL DATA:  Poly trauma, critical, head/cervical spine injury suspected. Facial trauma. Additional history provided: Fall, laceration to bottom lip, multiple bruises, patient also reports falling yesterday. EXAM: CT HEAD WITHOUT CONTRAST CT MAXILLOFACIAL WITHOUT CONTRAST CT CERVICAL SPINE WITHOUT CONTRAST TECHNIQUE: Multidetector CT imaging of the head, cervical spine, and maxillofacial structures were performed using the standard protocol without intravenous contrast. Multiplanar CT image reconstructions of the cervical spine and  maxillofacial structures were also generated. COMPARISON:  Head CT 10/25/2019. FINDINGS: CT HEAD FINDINGS Brain: Stable, mild generalized cerebral atrophy. Stable, mild patchy hypoattenuation within the cerebral white matter which is nonspecific, but consistent with chronic small vessel ischemic disease. There is acute parafalcine subdural hemorrhage measuring up to 4 mm (for instance as seen on series 2, image 26) (series 4, image 35). Trace acute subdural hemorrhage is also questioned along the left tentorium (series 2, images 11 and 12). No demarcated cortical infarct is identified. No evidence of intracranial mass. No midline shift. Vascular: No hyperdense vessel. Skull: Normal. Negative for fracture or focal lesion. Other: Bilateral mastoid effusions. CT MAXILLOFACIAL FINDINGS Osseous: Motion degradation limits evaluation of the mandibular body. No acute maxillofacial fracture is identified. Orbits: No acute finding. The globes are normal in size and contour. The extraocular muscles and optic nerve sheath complexes are symmetric and unremarkable. Sinuses: Mild ethmoid and right sphenoid sinus mucosal thickening. Soft tissues: Midline forehead and right maxillofacial soft tissue swelling/hematoma. Incidentally noted anterior nasal septal defect (series 8, image 19). CT CERVICAL SPINE FINDINGS Alignment: A mild cervical levocurvature may be positional. Straightening of the expected cervical lordosis. Trace C5-C6 grade 1 retrolisthesis Skull base and vertebrae: The basion-dental and atlanto-dental intervals are maintained.No evidence of acute fracture to the cervical spine. Soft tissues and spinal canal: No prevertebral fluid or swelling. No visible canal hematoma. Disc levels: Cervical spondylosis with multilevel disc space narrowing, posterior disc osteophytes, uncovertebral and facet hypertrophy. These findings are greatest at C5-C6, C6-C7 and C7-T1. Upper chest: No consolidation within the imaged lung apices.  No visible pneumothorax. These results were called by telephone at the time of interpretation on 11/12/2019 at 6:16 pm to provider Carmin Muskrat , who verbally acknowledged these results. IMPRESSION: CT head: 1. Acute parafalcine subdural hemorrhage measuring up to 4 mm. Trace acute subdural hemorrhage is also questioned along the left tentorium. 2. Stable mild generalized cerebral atrophy and chronic small vessel ischemic disease. 3. Bilateral mastoid effusions. CT maxillofacial 1. Evaluation of the mandibular body is limited due to motion degradation. 2. No acute maxillofacial fracture is identified. 3. Midline forehead and right maxillofacial soft tissue swelling/hematoma. 4. Mild ethmoid and right sphenoid sinus mucosal thickening. 5. Incidentally noted anterior nasal septal defect. CT cervical spine: 1. No evidence of acute fracture to the cervical spine. 2. Trace C5-C6 grade 1 retrolisthesis. 3. Cervical spondylosis as described and greatest at C5-C6, C6-C7 and C7-T1. Electronically Signed   By: Kellie Simmering DO   On: 11/12/2019 18:17   CT Maxillofacial WO CM  Result Date: 11/12/2019 CLINICAL DATA:  Poly trauma, critical, head/cervical spine injury suspected. Facial trauma. Additional history provided: Fall, laceration to bottom lip, multiple bruises, patient also reports falling yesterday. EXAM: CT HEAD WITHOUT CONTRAST CT MAXILLOFACIAL WITHOUT CONTRAST CT CERVICAL SPINE WITHOUT CONTRAST TECHNIQUE: Multidetector CT imaging of the head, cervical spine, and maxillofacial structures were performed using the standard protocol without intravenous contrast. Multiplanar CT image reconstructions of the cervical spine and maxillofacial structures were also generated.  COMPARISON:  Head CT 10/25/2019. FINDINGS: CT HEAD FINDINGS Brain: Stable, mild generalized cerebral atrophy. Stable, mild patchy hypoattenuation within the cerebral white matter which is nonspecific, but consistent with chronic small vessel ischemic  disease. There is acute parafalcine subdural hemorrhage measuring up to 4 mm (for instance as seen on series 2, image 26) (series 4, image 35). Trace acute subdural hemorrhage is also questioned along the left tentorium (series 2, images 11 and 12). No demarcated cortical infarct is identified. No evidence of intracranial mass. No midline shift. Vascular: No hyperdense vessel. Skull: Normal. Negative for fracture or focal lesion. Other: Bilateral mastoid effusions. CT MAXILLOFACIAL FINDINGS Osseous: Motion degradation limits evaluation of the mandibular body. No acute maxillofacial fracture is identified. Orbits: No acute finding. The globes are normal in size and contour. The extraocular muscles and optic nerve sheath complexes are symmetric and unremarkable. Sinuses: Mild ethmoid and right sphenoid sinus mucosal thickening. Soft tissues: Midline forehead and right maxillofacial soft tissue swelling/hematoma. Incidentally noted anterior nasal septal defect (series 8, image 19). CT CERVICAL SPINE FINDINGS Alignment: A mild cervical levocurvature may be positional. Straightening of the expected cervical lordosis. Trace C5-C6 grade 1 retrolisthesis Skull base and vertebrae: The basion-dental and atlanto-dental intervals are maintained.No evidence of acute fracture to the cervical spine. Soft tissues and spinal canal: No prevertebral fluid or swelling. No visible canal hematoma. Disc levels: Cervical spondylosis with multilevel disc space narrowing, posterior disc osteophytes, uncovertebral and facet hypertrophy. These findings are greatest at C5-C6, C6-C7 and C7-T1. Upper chest: No consolidation within the imaged lung apices. No visible pneumothorax. These results were called by telephone at the time of interpretation on 11/12/2019 at 6:16 pm to provider Carmin Muskrat , who verbally acknowledged these results. IMPRESSION: CT head: 1. Acute parafalcine subdural hemorrhage measuring up to 4 mm. Trace acute subdural  hemorrhage is also questioned along the left tentorium. 2. Stable mild generalized cerebral atrophy and chronic small vessel ischemic disease. 3. Bilateral mastoid effusions. CT maxillofacial 1. Evaluation of the mandibular body is limited due to motion degradation. 2. No acute maxillofacial fracture is identified. 3. Midline forehead and right maxillofacial soft tissue swelling/hematoma. 4. Mild ethmoid and right sphenoid sinus mucosal thickening. 5. Incidentally noted anterior nasal septal defect. CT cervical spine: 1. No evidence of acute fracture to the cervical spine. 2. Trace C5-C6 grade 1 retrolisthesis. 3. Cervical spondylosis as described and greatest at C5-C6, C6-C7 and C7-T1. Electronically Signed   By: Kellie Simmering DO   On: 11/12/2019 18:17   {       Assessment and Plan:   Syncope complicated by subdural hematoma BP's running low ? Orthostatic hypotension. No dizziness or warning symptoms. Imdur, demadex, toprol all on hold. No arrhythmias on tele. Will continue to monitor, hold meds, will need 2 week monitor at discharge. Doubt aortic stenosis is cause of this.  Subdural hematoma on Eliquis secondary to fall/syncope.  Eliquis stopped and having repeat CT today  Acute pulmonary artery thrombosis treated with IV heparin and Eliquis earlier this month  Moderate to severe aortic stenosis on echo 07/2019 and repeat 10/2019 mean gradient 37 mmHg AVA 1.03 cm   CAD status post NSTEMI 28/3151 complicated by cardiogenic shock followed by CABG with LIMA to the LAD, SVG to the circumflex SVG to diagonal.  LVEF 45 to 50% 12/2016 but normalized on echo 2019.  NST 10/2018 no ischemia  Discharge 11/06/2019 acute hypoxic respiratory failure secondary to an pneumonia complicated by right-sided pleural effusion loculated and possible empyema  and COPD        For questions or updates, please contact [facility] Please consult [email or web address] for contact info under    Signed, Ermalinda Barrios, PA-C    11/13/2019 8:01 AM  Attending note Patient seen and discussed with PA Bonnell Public, I agree with her documentation. 64 yo female history of CAD with prior CABG in 11/2016, carotid stenosis with prior CEA as well as right subclavian/right common carotid bypass 12/2017, COPD, lung cancer s/p wedge resection, PA thrombus on eliuquis,  admitted with a fall. CT scan showed small subdural hematoma, anticoag held.     ER vitals: p 94 bp 101/54 100% 2L [place] K 3.9 Cr 0.39 BUN 6 BNP 337 WBC 5. 2 Hgb 9.9 Plt 196  COVID neg CXR very small right pleural effusion  10/2019 echo: LVEF 60-65%, grade II DDx, mod to severe AS (AVA VTI 1.03, mean grade 37, dimensionless index 0.30)  Orthostatics negative 10AM this morning   Aortic stenosis by consensus of parameters is moderate, though high end of range. Not to the degree that would be associated with syncope, I don't believed this played a role. Certaintly with AS increased risk of conduction disease, lack of prodrome with her episode could suggest an arrhythmia related cause of syncope. Follow on tele here, will need outpatient monitor. With soft bp's agree with holding home bp meds.   Can remain off toprol and imdur until outpatient f/u, would restart torsemide 20mg  MWF.   Carlyle Dolly MD

## 2019-11-13 NOTE — TOC Initial Note (Addendum)
Transition of Care [facility]) - Initial/Assessment Note   Patient Details  Name: Emily Rocha MRN: [id number] Date of Birth: [birth year]  [facility] CM/SW Contact:    Sherie Don, LCSW Phone Number: 11/13/2019, 10:51 AM  Clinical Narrative: Patient is a 64 year old female who is under observation for subdural hematoma. Patient has history of GERD, CAD, thrombus, recurrent falls, diastolic CHF, hypoalbuminemia, chronic respiratory failure with hypoxia, and syncope. Per PT evaluation, HHPT is recommended. Patient is already active with [facility]. CSW updated Georgina Snell with [facility] regarding patient. TOC to follow.  Addendum: CSW notified by RN that patient's husband was requesting call to discuss patient's oxygen supplies. CSW spoke with husband, who requested different O2 supplies. CSW notified Barbaraann Rondo with [employer].  Expected Discharge Plan: [place] Barriers to Discharge: Continued Medical Work up  Patient Goals and CMS Choice Patient states their goals for this hospitalization and ongoing recovery are:: Discharge home with [facility] CMS Medicare.gov Compare Post Acute Care list provided to:: Patient Choice offered to / list presented to : Patient  Expected Discharge Plan and Services Expected Discharge Plan: [place] In-house Referral: Clinical Social Work Discharge Planning Services: NA Post Acute Care Choice: Home Health Living arrangements for the past 2 months: Single Family Home             DME Arranged: N/A DME Agency: NA HH Arranged: PT HH Agency: [place] Date [place]: 11/13/19 Time [place]: 4332 Representative spoke with at [place]: Georgina Snell  Prior Living Arrangements/Services Living arrangements for the past 2 months: Single Family Home Lives with:: Spouse Patient language and need for interpreter reviewed:: Yes Do you feel safe going back to the place where you live?: Yes      Need for Family  Participation in Patient Care: No (Comment) Care giver support system in place?: Yes (comment) Current home services: Home PT Criminal Activity/Legal Involvement Pertinent to Current Situation/Hospitalization: No - Comment as needed  Permission Sought/Granted Permission sought to share information with : Facility Art therapist granted to share information with : Yes, Verbal Permission Granted Permission granted to share info w AGENCY: [facility]  Emotional Assessment Appearance:: Appears stated age Attitude/Demeanor/Rapport: Engaged Affect (typically observed): Accepting Orientation: : Oriented to Self, Oriented to Place, Oriented to  Time, Oriented to Situation Alcohol / Substance Use: Not Applicable Psych Involvement: No (comment)  Admission diagnosis:  Subdural hematoma ([place]) [S06.5X9A]   Patient Active Problem List   Diagnosis Date Noted  . Chronic respiratory failure with hypoxia ([place]) 11/13/2019  . Chronic diastolic CHF (congestive heart failure) ([place]) 11/13/2019  . Syncope 11/13/2019  . Subdural hematoma ([place]) 11/12/2019  . Recurrent falls 11/12/2019  . Diastolic CHF, chronic ([place]) 11/12/2019  . Lip laceration 11/12/2019  . Hypoalbuminemia 11/12/2019  . Shoulder pain 11/05/2019  . Aspiration pneumonia ([place]) 11/02/2019  . Hiatal hernia 11/02/2019  . Thrombus 11/02/2019  . Hypokalemia 11/02/2019  . Acute metabolic encephalopathy   . Acute respiratory failure with hypoxia ([place]) 10/24/2019  . Rt Sided Pneumonia with loculated parapneumonic effusion 10/24/2019  . Rt Sided ParaPneumonic Pleural effusion 10/24/2019  . History of lung cancer- Non-Small cell/S/p Prior VATs 10/24/2019  . CAD (coronary artery disease)/Prior CABG 10/24/2019  . Acute respiratory failure ([place]) 10/24/2019  . Malignant neoplasm of right upper lobe of lung ([place]) 11/27/2018  . S/P lobectomy of lung 10/26/2018  . Nodule of upper lobe of right lung 09/13/2018  .  Wound of sternal region  01/16/2018  . DVT (deep venous thrombosis) ([place]) 01/16/2018  . TIA due to embolism ([place]) 12/26/2017  . Acute diastolic heart failure ([place])   . Unstable angina ([place]) 12/14/2016  . S/P CABG x 3 12/14/2016  . Chest pain 12/12/2016  . NSTEMI (non-ST elevated myocardial infarction) ([place]) 12/12/2016  . COPD exacerbation ([place]) 12/12/2016  . GERD (gastroesophageal reflux disease) 12/12/2016  . CAP (community acquired pneumonia) 12/12/2016  . Abdominal pain, epigastric 11/30/2016  . Absolute anemia 11/30/2016  . Carotid stenosis 12/19/2014  . Aftercare following surgery of the circulatory system 12/19/2014  . Carotid artery stenosis, symptomatic 12/06/2014  . Symptomatic carotid artery stenosis 12/04/2014   PCP:  No primary care provider on file. Pharmacy:   [facility], [address] [place] [address] Phone: [phone] Fax: [phone]  [facility] [place] [place] - [address] [place] [address] Phone: [phone] Fax: [phone]  Readmission Risk Interventions No flowsheet data found.

## 2019-11-13 NOTE — ED Notes (Signed)
Oral care done.

## 2019-11-14 NOTE — Addendum Note (Signed)
Encounter addended by: Philis Kendall on: 11/14/2019 8:29 AM  Actions taken: Flowsheet data copied forward, Flowsheet accepted

## 2019-11-15 ENCOUNTER — Encounter (HOSPITAL_COMMUNITY): Payer: Medicare HMO

## 2019-11-15 NOTE — Progress Notes (Signed)
Pulmonary Individual Treatment Plan  Patient Details  Name: Emily Rocha MRN: [id number] Date of Birth: 16-Aug-1955 Referring Provider:     PULMONARY REHAB OTHER RESP ORIENTATION from 10/01/2019 in [place]  Referring Provider Dr. Vaughan Browner      Initial Encounter Date:    [place] from 10/01/2019 in [place]  Date 10/01/19      Visit Diagnosis: Chronic obstructive pulmonary disease, unspecified COPD type ([place])  Patient's Home Medications on Admission:   Current Outpatient Medications:  .  albuterol (PROVENTIL) (2.5 MG/3ML) 0.083% nebulizer solution, Take 2.5 mg by nebulization every 4 (four) hours as needed., Disp: , Rfl:  .  ALPRAZolam (XANAX) 0.5 MG tablet, Take 0.5 mg by mouth 3 (three) times daily. , Disp: , Rfl:  .  Ascorbic Acid (VITAMIN C GUMMIE) 120 MG CHEW, Chew 500 mg by mouth., Disp: , Rfl:  .  atorvastatin (LIPITOR) 40 MG tablet, Take 1 tablet (40 mg total) daily by mouth., Disp: 30 tablet, Rfl: 1 .  calcium carbonate (OS-CAL) 1250 (500 Ca) MG chewable tablet, Chew 1 tablet by mouth daily., Disp: , Rfl:  .  cholecalciferol (VITAMIN D3) 25 MCG (1000 UNIT) tablet, Take 1,000 Units by mouth daily., Disp: , Rfl:  .  diphenhydramine-acetaminophen (TYLENOL PM) 25-500 MG TABS tablet, Take 2-3 tablets by mouth at bedtime., Disp: , Rfl:  .  guaiFENesin (MUCINEX) 600 MG 12 hr tablet, Take 1 tablet (600 mg total) by mouth 2 (two) times daily as needed., Disp: 60 tablet, Rfl: 1 .  Multiple Vitamin (MULTIVITAMIN WITH MINERALS) TABS tablet, Take 1 tablet by mouth daily., Disp:  , Rfl:  .  pantoprazole (PROTONIX) 40 MG tablet, Take 40 mg by mouth daily., Disp: , Rfl:  .  potassium chloride SA (KLOR-CON) 20 MEQ tablet, Take 1 tablet (20 mEq total) by mouth every Monday, Wednesday, and Friday., Disp: , Rfl: 2 .  QUEtiapine (SEROQUEL) 25 MG tablet, Take 1 tablet (25 mg total) by mouth at bedtime., Disp: 30 tablet,  Rfl: 0 .  Tiotropium Bromide-Olodaterol (STIOLTO RESPIMAT) 2.5-2.5 MCG/ACT AERS, Inhale 2 puffs into the lungs daily., Disp: 4 g, Rfl: 4 .  torsemide (DEMADEX) 20 MG tablet, Take 1 tablet (20 mg total) by mouth every Monday, Wednesday, and Friday., Disp: , Rfl:   Past Medical History: Past Medical History:  Diagnosis Date  . Anxiety   . Asthma   . COPD (chronic obstructive pulmonary disease) ([place])   . Coronary artery disease    hx CABG  . GERD (gastroesophageal reflux disease)   . Peripheral vascular disease ([place])   . Pneumonia   . Shortness of breath dyspnea   . Stroke [facility])    "mini stroke" numbness and weakness to left side of the body    Tobacco Use: Social History   Tobacco Use  Smoking Status Former Smoker  . Packs/day: 2.00  . Years: 40.00  . Pack years: 80.00  . Quit date: 10/24/2015  . Years since quitting: 4.0  Smokeless Tobacco Never Used    Labs: Recent Review Flowsheet Data    Labs for ITP Cardiac and Pulmonary Rehab Latest Ref Rng & Units 10/24/2019 10/25/2019 10/27/2019 10/28/2019 10/30/2019   Cholestrol 0 - 200 mg/dL - - - - -   LDLCALC 0 - 99 mg/dL - - - - -   HDL >40 mg/dL - - - - -   Trlycerides <150 mg/dL 151(H) - - - -   Hemoglobin A1c  4.8 - 5.6 % - - - - -   PHART 7.35 - 7.45 - 7.264(L) 7.320(L) 7.341(L) 7.399   PCO2ART 32 - 48 mmHg - 67.0(HH) 64.1(H) 63.2(H) 54.1(H)   HCO3 20.0 - 28.0 mmol/L - 25.7 32.1(H) 33.3(H) 33.4(H)   TCO2 22 - 32 mmol/L - - - - 35(H)   O2SAT % - 96.6 96.8 97.3 93.0      Capillary Blood Glucose: Lab Results  Component Value Date   GLUCAP 71 11/05/2019   GLUCAP 80 11/05/2019   GLUCAP 187 (H) 11/04/2019   GLUCAP 167 (H) 11/04/2019   GLUCAP 106 (H) 11/04/2019     Pulmonary Assessment Scores:  Pulmonary Assessment Scores    Row Name 10/01/19 0950         ADL UCSD   ADL Phase Entry     SOB Score total 49     Rest 0     Walk 3     Stairs 4     Bath 0     Dress 1     Shop 3       CAT Score   CAT Score 15         mMRC Score   mMRC Score 2           UCSD: Self-administered rating of dyspnea associated with activities of daily living (ADLs) 6-point scale (0 = "not at all" to 5 = "maximal or unable to do because of breathlessness")  Scoring Scores range from 0 to 120.  Minimally important difference is 5 units  CAT: CAT can identify the health impairment of COPD patients and is better correlated with disease progression.  CAT has a scoring range of zero to 40. The CAT score is classified into four groups of low (less than 10), medium (10 - 20), high (21-30) and very high (31-40) based on the impact level of disease on health status. A CAT score over 10 suggests significant symptoms.  A worsening CAT score could be explained by an exacerbation, poor medication adherence, poor inhaler technique, or progression of COPD or comorbid conditions.  CAT MCID is 2 points  mMRC: mMRC (Modified Medical Research Council) Dyspnea Scale is used to assess the degree of baseline functional disability in patients of respiratory disease due to dyspnea. No minimal important difference is established. A decrease in score of 1 point or greater is considered a positive change.   Pulmonary Function Assessment:  Pulmonary Function Assessment - 10/01/19 1004      Initial Spirometry Results   FVC% 50 %    FEV1% 43 %    FEV1/FVC Ratio 85      Post Bronchodilator Spirometry Results   FVC% 51 %    FEV1% 42 %    FEV1/FVC Ratio 82      Breath   Bilateral Breath Sounds Clear;Decreased    Shortness of Breath Yes;Limiting activity           Exercise Target Goals: Exercise Program Goal: Individual exercise prescription set using results from initial 6 min walk test and THRR while considering  patient's activity barriers and safety.   Exercise Prescription Goal: Initial exercise prescription builds to 30-45 minutes a day of aerobic activity, 2-3 days per week.  Home exercise guidelines will be given to patient  during program as part of exercise prescription that the participant will acknowledge.  Activity Barriers & Risk Stratification:   6 Minute Walk:  6 Minute Walk    Row Name 10/01/19 1157  6 Minute Walk   Distance 1150 feet     Distance % Change 0 %     Distance Feet Change 0 ft     Walk Time 6 minutes     # of Rest Breaks 0     MPH 2.18     METS 2.65     RPE 13     Perceived Dyspnea  13     VO2 Peak 9.27     Symptoms Yes (comment)     Comments SpO2 dropped to 85%, and rose back to 90% after about 15 seconds as I was going to get O2. It did not drop again during the remainder of the test.     Resting HR 71 bpm     Resting BP 108/60     Resting Oxygen Saturation  92 %     Exercise Oxygen Saturation  during 6 min walk 85 %     Max Ex. HR 96 bpm     Max Ex. BP 142/74     2 Minute Post BP 120/74       Interval HR   1 Minute HR 83     2 Minute HR 91     3 Minute HR 81     4 Minute HR 89     5 Minute HR 90     6 Minute HR 96     2 Minute Post HR 77     Interval Heart Rate? Yes       Interval Oxygen   Interval Oxygen? Yes     Baseline Oxygen Saturation % 92 %     1 Minute Oxygen Saturation % 91 %     1 Minute Liters of Oxygen 0 L     2 Minute Oxygen Saturation % 85 %     2 Minute Liters of Oxygen 0 L     3 Minute Oxygen Saturation % 90 %     3 Minute Liters of Oxygen 0 L     4 Minute Oxygen Saturation % 91 %     4 Minute Liters of Oxygen 0 L     5 Minute Oxygen Saturation % 90 %     5 Minute Liters of Oxygen 0 L     6 Minute Oxygen Saturation % 90 %     6 Minute Liters of Oxygen 0 L     2 Minute Post Oxygen Saturation % 92 %     2 Minute Post Liters of Oxygen 0 L            Oxygen Initial Assessment:  Oxygen Initial Assessment - 10/01/19 0951      Home Oxygen   Home Oxygen Device None    Sleep Oxygen Prescription None    Home Exercise Oxygen Prescription None    Home Resting Oxygen Prescription None      Initial 6 min Walk   Oxygen Used None       Program Oxygen Prescription   Program Oxygen Prescription None      Intervention   Short Term Goals To learn and exhibit compliance with exercise, home and travel O2 prescription;To learn and understand importance of monitoring SPO2 with pulse oximeter and demonstrate accurate use of the pulse oximeter.;To learn and understand importance of maintaining oxygen saturations>88%;To learn and demonstrate proper pursed lip breathing techniques or other breathing techniques.    Long  Term Goals Exhibits compliance with exercise, home and travel O2 prescription;Verbalizes importance of monitoring  SPO2 with pulse oximeter and return demonstration;Maintenance of O2 saturations>88%;Exhibits proper breathing techniques, such as pursed lip breathing or other method taught during program session           Oxygen Re-Evaluation:  Oxygen Re-Evaluation    Row Name 10/17/19 0847 11/14/19 0825           Program Oxygen Prescription   Program Oxygen Prescription None None        Home Oxygen   Home Oxygen Device None None      Sleep Oxygen Prescription None None      Home Exercise Oxygen Prescription None None      Home Resting Oxygen Prescription None None      Compliance with Home Oxygen Use Yes Yes        Goals/Expected Outcomes   Short Term Goals To learn and exhibit compliance with exercise, home and travel O2 prescription;To learn and understand importance of monitoring SPO2 with pulse oximeter and demonstrate accurate use of the pulse oximeter.;To learn and understand importance of maintaining oxygen saturations>88%;To learn and demonstrate proper pursed lip breathing techniques or other breathing techniques. To learn and exhibit compliance with exercise, home and travel O2 prescription;To learn and understand importance of monitoring SPO2 with pulse oximeter and demonstrate accurate use of the pulse oximeter.;To learn and understand importance of maintaining oxygen saturations>88%;To learn and  demonstrate proper pursed lip breathing techniques or other breathing techniques.      Long  Term Goals Exhibits compliance with exercise, home and travel O2 prescription;Verbalizes importance of monitoring SPO2 with pulse oximeter and return demonstration;Maintenance of O2 saturations>88%;Exhibits proper breathing techniques, such as pursed lip breathing or other method taught during program session Exhibits compliance with exercise, home and travel O2 prescription;Verbalizes importance of monitoring SPO2 with pulse oximeter and return demonstration;Maintenance of O2 saturations>88%;Exhibits proper breathing techniques, such as pursed lip breathing or other method taught during program session      Goals/Expected Outcomes compliance compliance             Oxygen Discharge (Final Oxygen Re-Evaluation):  Oxygen Re-Evaluation - 11/14/19 0825      Program Oxygen Prescription   Program Oxygen Prescription None      Home Oxygen   Home Oxygen Device None    Sleep Oxygen Prescription None    Home Exercise Oxygen Prescription None    Home Resting Oxygen Prescription None    Compliance with Home Oxygen Use Yes      Goals/Expected Outcomes   Short Term Goals To learn and exhibit compliance with exercise, home and travel O2 prescription;To learn and understand importance of monitoring SPO2 with pulse oximeter and demonstrate accurate use of the pulse oximeter.;To learn and understand importance of maintaining oxygen saturations>88%;To learn and demonstrate proper pursed lip breathing techniques or other breathing techniques.    Long  Term Goals Exhibits compliance with exercise, home and travel O2 prescription;Verbalizes importance of monitoring SPO2 with pulse oximeter and return demonstration;Maintenance of O2 saturations>88%;Exhibits proper breathing techniques, such as pursed lip breathing or other method taught during program session    Goals/Expected Outcomes compliance            Initial Exercise Prescription:  Initial Exercise Prescription - 10/01/19 1200      Date of Initial Exercise RX and Referring Provider   Date 10/01/19    Referring Provider Dr. Vaughan Browner    Expected Discharge Date 01/01/20      NuStep   Level 1    SPM 80  Minutes 22      Arm Ergometer   Level 1    RPM 60    Minutes 17      Prescription Details   Frequency (times per week) 2    Duration Progress to 30 minutes of continuous aerobic without signs/symptoms of physical distress      Intensity   THRR 40-80% of Max Heartrate 62-123    Ratings of Perceived Exertion 11-13    Perceived Dyspnea 0-4      Progression   Progression Continue progressive overload as per policy without signs/symptoms or physical distress.      Resistance Training   Training Prescription Yes    Weight 1 lbs    Reps 10-15           Perform Capillary Blood Glucose checks as needed.  Exercise Prescription Changes:   Exercise Prescription Changes    Row Name 10/18/19 1435             Response to Exercise   Blood Pressure (Admit) 110/62       Blood Pressure (Exercise) 118/66       Blood Pressure (Exit) 110/58       Heart Rate (Admit) 86 bpm       Heart Rate (Exercise) 96 bpm       Heart Rate (Exit) 89 bpm       Oxygen Saturation (Admit) 93 %       Oxygen Saturation (Exercise) 90 %       Oxygen Saturation (Exit) 91 %       Rating of Perceived Exertion (Exercise) 13       Perceived Dyspnea (Exercise) 13       Duration Continue with 30 min of aerobic exercise without signs/symptoms of physical distress.       Intensity THRR unchanged         Progression   Progression Continue to progress workloads to maintain intensity without signs/symptoms of physical distress.         Resistance Training   Training Prescription Yes       Weight 1 lbs       Reps 10-15         NuStep   Level 1       SPM 82       Minutes 22       METs 1.06         Arm Ergometer   Level 1       Watts 11        RPM 48       Minutes 17       METs 1.8              Exercise Comments:   Exercise Goals and Review:   Exercise Goals    Row Name 10/01/19 1203 10/17/19 0847 11/14/19 0825         Exercise Goals   Increase Physical Activity Yes Yes Yes     Intervention Provide advice, education, support and counseling about physical activity/exercise needs.;Develop an individualized exercise prescription for aerobic and resistive training based on initial evaluation findings, risk stratification, comorbidities and participant's personal goals. Provide advice, education, support and counseling about physical activity/exercise needs.;Develop an individualized exercise prescription for aerobic and resistive training based on initial evaluation findings, risk stratification, comorbidities and participant's personal goals. Provide advice, education, support and counseling about physical activity/exercise needs.;Develop an individualized exercise prescription for aerobic and resistive training based on initial evaluation findings, risk  stratification, comorbidities and participant's personal goals.     Expected Outcomes Short Term: Attend rehab on a regular basis to increase amount of physical activity.;Long Term: Add in home exercise to make exercise part of routine and to increase amount of physical activity.;Long Term: Exercising regularly at least 3-5 days a week. Short Term: Attend rehab on a regular basis to increase amount of physical activity.;Long Term: Add in home exercise to make exercise part of routine and to increase amount of physical activity.;Long Term: Exercising regularly at least 3-5 days a week. Short Term: Attend rehab on a regular basis to increase amount of physical activity.;Long Term: Add in home exercise to make exercise part of routine and to increase amount of physical activity.;Long Term: Exercising regularly at least 3-5 days a week.     Increase Strength and Stamina Yes Yes Yes      Intervention Provide advice, education, support and counseling about physical activity/exercise needs.;Develop an individualized exercise prescription for aerobic and resistive training based on initial evaluation findings, risk stratification, comorbidities and participant's personal goals. Provide advice, education, support and counseling about physical activity/exercise needs.;Develop an individualized exercise prescription for aerobic and resistive training based on initial evaluation findings, risk stratification, comorbidities and participant's personal goals. Provide advice, education, support and counseling about physical activity/exercise needs.;Develop an individualized exercise prescription for aerobic and resistive training based on initial evaluation findings, risk stratification, comorbidities and participant's personal goals.     Expected Outcomes Short Term: Increase workloads from initial exercise prescription for resistance, speed, and METs.;Short Term: Perform resistance training exercises routinely during rehab and add in resistance training at home;Long Term: Improve cardiorespiratory fitness, muscular endurance and strength as measured by increased METs and functional capacity (6MWT) Short Term: Increase workloads from initial exercise prescription for resistance, speed, and METs.;Short Term: Perform resistance training exercises routinely during rehab and add in resistance training at home;Long Term: Improve cardiorespiratory fitness, muscular endurance and strength as measured by increased METs and functional capacity (6MWT) Short Term: Increase workloads from initial exercise prescription for resistance, speed, and METs.;Short Term: Perform resistance training exercises routinely during rehab and add in resistance training at home;Long Term: Improve cardiorespiratory fitness, muscular endurance and strength as measured by increased METs and functional capacity (6MWT)     Able to understand  and use rate of perceived exertion (RPE) scale Yes Yes Yes     Intervention Provide education and explanation on how to use RPE scale Provide education and explanation on how to use RPE scale Provide education and explanation on how to use RPE scale     Expected Outcomes Short Term: Able to use RPE daily in rehab to express subjective intensity level;Long Term:  Able to use RPE to guide intensity level when exercising independently Short Term: Able to use RPE daily in rehab to express subjective intensity level;Long Term:  Able to use RPE to guide intensity level when exercising independently Short Term: Able to use RPE daily in rehab to express subjective intensity level;Long Term:  Able to use RPE to guide intensity level when exercising independently     Able to understand and use Dyspnea scale Yes Yes Yes     Intervention Provide education and explanation on how to use Dyspnea scale Provide education and explanation on how to use Dyspnea scale Provide education and explanation on how to use Dyspnea scale     Expected Outcomes Short Term: Able to use Dyspnea scale daily in rehab to express subjective sense of shortness of breath  during exertion;Long Term: Able to use Dyspnea scale to guide intensity level when exercising independently Short Term: Able to use Dyspnea scale daily in rehab to express subjective sense of shortness of breath during exertion;Long Term: Able to use Dyspnea scale to guide intensity level when exercising independently Short Term: Able to use Dyspnea scale daily in rehab to express subjective sense of shortness of breath during exertion;Long Term: Able to use Dyspnea scale to guide intensity level when exercising independently     Knowledge and understanding of Target Heart Rate Range (THRR) Yes Yes Yes     Intervention Provide education and explanation of THRR including how the numbers were predicted and where they are located for reference Provide education and explanation of THRR  including how the numbers were predicted and where they are located for reference Provide education and explanation of THRR including how the numbers were predicted and where they are located for reference     Expected Outcomes Short Term: Able to state/look up THRR;Long Term: Able to use THRR to govern intensity when exercising independently;Short Term: Able to use daily as guideline for intensity in rehab Short Term: Able to state/look up THRR;Long Term: Able to use THRR to govern intensity when exercising independently;Short Term: Able to use daily as guideline for intensity in rehab Short Term: Able to state/look up THRR;Long Term: Able to use THRR to govern intensity when exercising independently;Short Term: Able to use daily as guideline for intensity in rehab     Understanding of Exercise Prescription Yes Yes Yes     Intervention Provide education, explanation, and written materials on patient's individual exercise prescription Provide education, explanation, and written materials on patient's individual exercise prescription Provide education, explanation, and written materials on patient's individual exercise prescription     Expected Outcomes Long Term: Able to explain home exercise prescription to exercise independently;Short Term: Able to explain program exercise prescription Long Term: Able to explain home exercise prescription to exercise independently;Short Term: Able to explain program exercise prescription Long Term: Able to explain home exercise prescription to exercise independently;Short Term: Able to explain program exercise prescription            Exercise Goals Re-Evaluation :  Exercise Goals Re-Evaluation    [place] Name 10/17/19 0848 11/14/19 0826           Exercise Goal Re-Evaluation   Exercise Goals Review Increase Physical Activity;Increase Strength and Stamina;Able to understand and use rate of perceived exertion (RPE) scale;Able to understand and use Dyspnea  scale;Knowledge and understanding of Target Heart Rate Range (THRR);Understanding of Exercise Prescription Increase Physical Activity;Increase Strength and Stamina;Able to understand and use rate of perceived exertion (RPE) scale;Able to understand and use Dyspnea scale;Knowledge and understanding of Target Heart Rate Range (THRR);Understanding of Exercise Prescription      Comments Pt has attended 2 exercise sessions. Her O2 levels have remained in the low 90s on RA so far. I was unsure if she would need O2 or not when she first completed her walk test. She is currently exercising at 1.9 METs on the stepper. Will continue to monitor and progess as able. Pt has only attended 3 exercise sessions and has been absent since 10/18/19. She was admitted to the hospital for several weeks. OT recommended HHOT when she was discharged so it is unclear if she will be able to rejoin the program. She was requiring 2L of O2 upon discharge from the hospital. Will monitor and progress if able.      Expected  Outcomes Through exercise at rehab and by engaging in a home exercise program, the pt will reach their goals. Through exercise at rehab and by engaging in a home exercise program, the pt will reach their goals.             Discharge Exercise Prescription (Final Exercise Prescription Changes):  Exercise Prescription Changes - 10/18/19 1435      Response to Exercise   Blood Pressure (Admit) 110/62    Blood Pressure (Exercise) 118/66    Blood Pressure (Exit) 110/58    Heart Rate (Admit) 86 bpm    Heart Rate (Exercise) 96 bpm    Heart Rate (Exit) 89 bpm    Oxygen Saturation (Admit) 93 %    Oxygen Saturation (Exercise) 90 %    Oxygen Saturation (Exit) 91 %    Rating of Perceived Exertion (Exercise) 13    Perceived Dyspnea (Exercise) 13    Duration Continue with 30 min of aerobic exercise without signs/symptoms of physical distress.    Intensity THRR unchanged      Progression   Progression Continue to progress  workloads to maintain intensity without signs/symptoms of physical distress.      Resistance Training   Training Prescription Yes    Weight 1 lbs    Reps 10-15      NuStep   Level 1    SPM 82    Minutes 22    METs 1.06      Arm Ergometer   Level 1    Watts 11    RPM 48    Minutes 17    METs 1.8           Nutrition:  Target Goals: Understanding of nutrition guidelines, daily intake of sodium 1500mg , cholesterol 200mg , calories 30% from fat and 7% or less from saturated fats, daily to have 5 or more servings of fruits and vegetables.  Biometrics:  Pre Biometrics - 10/01/19 1200      Pre Biometrics   Waist Circumference 47.5 inches    Hip Circumference 48 inches    Waist to Hip Ratio 0.99 %    Triceps Skinfold 21 mm    % Body Fat 33.7 %    Grip Strength 20.1 kg    Flexibility 20 in    Single Leg Stand 2.1 seconds            Nutrition Therapy Plan and Nutrition Goals:  Nutrition Therapy & Goals - 10/17/19 1456      Personal Nutrition Goals   Comments Patient continues to work on eating healthier. Will continue to monitor.      Intervention Plan   Intervention Nutrition handout(s) given to patient.           Nutrition Assessments:  Nutrition Assessments - 10/01/19 0952      MEDFICTS Scores   Pre Score 59           Nutrition Goals Re-Evaluation:   Nutrition Goals Discharge (Final Nutrition Goals Re-Evaluation):   Psychosocial: Target Goals: Acknowledge presence or absence of significant depression and/or stress, maximize coping skills, provide positive support system. Participant is able to verbalize types and ability to use techniques and skills needed for reducing stress and depression.  Initial Review & Psychosocial Screening:  Initial Psych Review & Screening - 10/01/19 0955      Initial Review   Current issues with Current Anxiety/Panic      Family Dynamics   Good Support System? Yes    Comments Emily Rocha has  great family support. She  lives with her husband whom she says is very supportative. She has a lot of friends and a church family that she says is also very supporative. She says her faith is very strong. She takes Alprazolam 0.5 mg TID to manage her anxiety which she says is controlled. Will continue to monitor.      Barriers   Psychosocial barriers to participate in program The patient should benefit from training in stress management and relaxation.      Screening Interventions   Interventions Encouraged to exercise    Expected Outcomes Short Term goal: Identification and review with participant of any Quality of Life or Depression concerns found by scoring the questionnaire.;Long Term goal: The participant improves quality of Life and PHQ9 Scores as seen by post scores and/or verbalization of changes           Quality of Life Scores:  Quality of Life - 10/01/19 1137      Quality of Life   Select Quality of Life      Quality of Life Scores   Health/Function Pre 28.78 %    Socioeconomic Pre 30 %    Psych/Spiritual Pre 30 %    Family Pre 30 %    GLOBAL Pre 29.44 %          Scores of 19 and below usually indicate a poorer quality of life in these areas.  A difference of  2-3 points is a clinically meaningful difference.  A difference of 2-3 points in the total score of the Quality of Life Index has been associated with significant improvement in overall quality of life, self-image, physical symptoms, and general health in studies assessing change in quality of life.   PHQ-9: Recent Review Flowsheet Data    Depression screen [facility] 2/9 10/01/2019 11/07/2018 01/31/2017   Decreased Interest 0 0 0   Down, Depressed, Hopeless 0 0 0   PHQ - 2 Score 0 0 0   Altered sleeping 0 - 0   Tired, decreased energy 0 - 2   Change in appetite 0 - 0   Feeling bad or failure about yourself  0 - 0   Trouble concentrating 0 - 0   Moving slowly or fidgety/restless 0 - 0   Suicidal thoughts 0 - 0   PHQ-9 Score 0 - 2    Difficult doing work/chores Not difficult at all - Somewhat difficult     Interpretation of Total Score  Total Score Depression Severity:  1-4 = Minimal depression, 5-9 = Mild depression, 10-14 = Moderate depression, 15-19 = Moderately severe depression, 20-27 = Severe depression   Psychosocial Evaluation and Intervention:  Psychosocial Evaluation - 10/01/19 1225      Psychosocial Evaluation & Interventions   Comments Patient's initial QOL score was 29.44  and her PHQ-9 score was 0. She has anxiety which is treated with Alprazolam 0.5 mg tid.           Psychosocial Re-Evaluation:  Psychosocial Re-Evaluation    [place] Name 10/17/19 1458             Psychosocial Re-Evaluation   Current issues with Current Anxiety/Panic       Comments Patient's initial QOL socre was 29.44% and her PHQ-9 was 0. She continues to be treated for anxiety with Alprazolam 0.5 mg BID. She feels her anxiety is managed with Alprazolam. Will continue to monitor.       Expected Outcomes Patient's anxiety will continue to be managed at  discharge with no additional psychosocial issues identified.       Interventions Encouraged to attend Pulmonary Rehabilitation for the exercise;Relaxation education;Stress management education       Continue Psychosocial Services  No Follow up required              Psychosocial Discharge (Final Psychosocial Re-Evaluation):  Psychosocial Re-Evaluation - 10/17/19 1458      Psychosocial Re-Evaluation   Current issues with Current Anxiety/Panic    Comments Patient's initial QOL socre was 29.44% and her PHQ-9 was 0. She continues to be treated for anxiety with Alprazolam 0.5 mg BID. She feels her anxiety is managed with Alprazolam. Will continue to monitor.    Expected Outcomes Patient's anxiety will continue to be managed at discharge with no additional psychosocial issues identified.    Interventions Encouraged to attend Pulmonary Rehabilitation for the exercise;Relaxation  education;Stress management education    Continue Psychosocial Services  No Follow up required            Education: Education Goals: Education classes will be provided on a weekly basis, covering required topics. Participant will state understanding/return demonstration of topics presented.  Learning Barriers/Preferences:  Learning Barriers/Preferences - 10/01/19 0953      Learning Barriers/Preferences   Learning Barriers None    Learning Preferences Written Material           Education Topics: How Lungs Work and Diseases: - Discuss the anatomy of the lungs and diseases that can affect the lungs, such as COPD.   Exercise: -Discuss the importance of exercise, FITT principles of exercise, normal and abnormal responses to exercise, and how to exercise safely.   Environmental Irritants: -Discuss types of environmental irritants and how to limit exposure to environmental irritants.   Meds/Inhalers and oxygen: - Discuss respiratory medications, definition of an inhaler and oxygen, and the proper way to use an inhaler and oxygen.   Energy Saving Techniques: - Discuss methods to conserve energy and decrease shortness of breath when performing activities of daily living.    Bronchial Hygiene / Breathing Techniques: - Discuss breathing mechanics, pursed-lip breathing technique,  proper posture, effective ways to clear airways, and other functional breathing techniques   Cleaning Equipment: - Provides group verbal and written instruction about the health risks of elevated stress, cause of high stress, and healthy ways to reduce stress.   Nutrition I: Fats: - Discuss the types of cholesterol, what cholesterol does to the body, and how cholesterol levels can be controlled.   CARDIAC REHAB PHASE II EXERCISE from 02/23/2017 in [place]  Date 02/09/17  Educator [place]  Instruction Review Code 2- Demonstrated Understanding      Nutrition II:  Labels: -Discuss the different components of food labels and how to read food labels.   CARDIAC REHAB PHASE II EXERCISE from 02/23/2017 in [place]  Date 02/16/17  Educator [place]  Instruction Review Code 2- Demonstrated Understanding      Respiratory Infections: - Discuss the signs and symptoms of respiratory infections, ways to prevent respiratory infections, and the importance of seeking medical treatment when having a respiratory infection.   Stress I: Signs and Symptoms: - Discuss the causes of stress, how stress may lead to anxiety and depression, and ways to limit stress.   Stress II: Relaxation: -Discuss relaxation techniques to limit stress.   Oxygen for Home/Travel: - Discuss how to prepare for travel when on oxygen and proper ways to transport and store oxygen to ensure safety.  Knowledge Questionnaire Score:  Knowledge Questionnaire Score - 10/01/19 0953      Knowledge Questionnaire Score   Pre Score 16/18           Core Components/Risk Factors/Patient Goals at Admission:  Personal Goals and Risk Factors at Admission - 10/01/19 1001      Core Components/Risk Factors/Patient Goals on Admission    Weight Management Obesity    Improve shortness of breath with ADL's Yes    Intervention Provide education, individualized exercise plan and daily activity instruction to help decrease symptoms of SOB with activities of daily living.    Expected Outcomes Short Term: Improve cardiorespiratory fitness to achieve a reduction of symptoms when performing ADLs;Long Term: Be able to perform more ADLs without symptoms or delay the onset of symptoms    Personal Goal Other Yes    Personal Goal Build stamina and improve lung capacity to breathe better. Lose 10 lbs.    Intervention Patient will attend [facility] 2 days/week and supplement with exercise at home 3 days/week.    Expected Outcomes Patient will meet both program and personal gaols.           Core  Components/Risk Factors/Patient Goals Review:   Goals and Risk Factor Review    Row Name 10/17/19 1500 11/15/19 1455           Core Components/Risk Factors/Patient Goals Review   Personal Goals Review Improve shortness of breath with ADL's;Weight Management/Obesity  Build stamina; improve lung capacity; lose 10 lbs. Improve shortness of breath with ADL's;Weight Management/Obesity;Other  Build stamina; improve lung capacity; lose 10 lbs.      Review Patient is new to the program. She has completed 3 sessions. Will continue to monitor for progress as she works toward meeting her personal goals. Patient has attended 1 session since last 30 day review. She was hopitalized 10/24/19  and discharged 11/06/19 with acute hypoxic respiratory failure secondary to pneumonia complicated by a right pleural effusion. She was transported to ED via EMS 11/12/19 following several episodes of syncope. She was evaluated by cardiology and put on a heart monitor for 2 weeks to r/o an arrhythmia. Hopefully she will be able to return to [facility] after she gets clearance from MD. Will continue to monitor for progress.      Expected Outcomes Patient will complete the program meeting both personal and program goals. Patient will complete the program meeting both personal and program goals.             Core Components/Risk Factors/Patient Goals at Discharge (Final Review):   Goals and Risk Factor Review - 11/15/19 1455      Core Components/Risk Factors/Patient Goals Review   Personal Goals Review Improve shortness of breath with ADL's;Weight Management/Obesity;Other   Build stamina; improve lung capacity; lose 10 lbs.   Review Patient has attended 1 session since last 30 day review. She was hopitalized 10/24/19  and discharged 11/06/19 with acute hypoxic respiratory failure secondary to pneumonia complicated by a right pleural effusion. She was transported to ED via EMS 11/12/19 following several episodes of syncope. She was evaluated  by cardiology and put on a heart monitor for 2 weeks to r/o an arrhythmia. Hopefully she will be able to return to [facility] after she gets clearance from MD. Will continue to monitor for progress.    Expected Outcomes Patient will complete the program meeting both personal and program goals.           ITP Comments:   Comments: ITP  REVIEW Patient only attended 1 sessions in this 30 day review due to acute illness with hospitalization. Will continue to monitor.

## 2019-11-15 NOTE — Addendum Note (Signed)
Encounter addended by: Dwana Melena, RN on: 11/15/2019 3:01 PM  Actions taken: Flowsheet data copied forward, Flowsheet accepted, Clinical Note Signed

## 2019-11-20 ENCOUNTER — Encounter (HOSPITAL_COMMUNITY): Payer: Medicare HMO

## 2019-11-20 DIAGNOSIS — Z299 Encounter for prophylactic measures, unspecified: Secondary | ICD-10-CM | POA: Diagnosis not present

## 2019-11-20 DIAGNOSIS — C3491 Malignant neoplasm of unspecified part of right bronchus or lung: Secondary | ICD-10-CM | POA: Diagnosis not present

## 2019-11-20 DIAGNOSIS — D692 Other nonthrombocytopenic purpura: Secondary | ICD-10-CM | POA: Diagnosis not present

## 2019-11-20 DIAGNOSIS — J439 Emphysema, unspecified: Secondary | ICD-10-CM | POA: Diagnosis not present

## 2019-11-20 DIAGNOSIS — F1320 Sedative, hypnotic or anxiolytic dependence, uncomplicated: Secondary | ICD-10-CM | POA: Diagnosis not present

## 2019-11-20 DIAGNOSIS — I4891 Unspecified atrial fibrillation: Secondary | ICD-10-CM | POA: Diagnosis not present

## 2019-11-20 DIAGNOSIS — Z87891 Personal history of nicotine dependence: Secondary | ICD-10-CM | POA: Diagnosis not present

## 2019-11-22 ENCOUNTER — Encounter (HOSPITAL_COMMUNITY): Payer: Medicare HMO

## 2019-11-27 ENCOUNTER — Encounter (HOSPITAL_COMMUNITY): Payer: Medicare HMO

## 2019-11-29 ENCOUNTER — Encounter (HOSPITAL_COMMUNITY): Payer: Medicare HMO

## 2019-12-03 ENCOUNTER — Other Ambulatory Visit: Payer: Self-pay

## 2019-12-03 NOTE — Patient Outreach (Signed)
[facility]) Care Management  12/03/2019  HANNAN TETZLAFF [birth year] [id number]   Telephone Assessment   Unsuccessful outreach attempt to patient.        Plan: RN CM will make outreach attempt to patient next month if no return call from patient.  Enzo Montgomery, RN,BSN,CCM [place] Management Telephonic Care Management Coordinator Direct Phone: [phone] Toll Free: [phone] Fax: [phone]

## 2019-12-04 ENCOUNTER — Encounter (HOSPITAL_COMMUNITY): Payer: Medicare HMO

## 2019-12-06 ENCOUNTER — Ambulatory Visit (INDEPENDENT_AMBULATORY_CARE_PROVIDER_SITE_OTHER): Payer: Medicare HMO

## 2019-12-06 ENCOUNTER — Encounter (HOSPITAL_COMMUNITY): Payer: Medicare HMO

## 2019-12-06 ENCOUNTER — Other Ambulatory Visit: Payer: Self-pay

## 2019-12-06 DIAGNOSIS — R55 Syncope and collapse: Secondary | ICD-10-CM | POA: Diagnosis not present

## 2019-12-06 NOTE — Addendum Note (Signed)
Addended by: Levonne Hubert on: 12/06/2019 04:24 PM   Modules accepted: Orders

## 2019-12-11 ENCOUNTER — Encounter (HOSPITAL_COMMUNITY): Payer: Medicare HMO

## 2019-12-12 NOTE — Progress Notes (Signed)
Discharge Progress Report  Patient Details  Name: Emily Rocha MRN: [id number] Date of Birth: Jul 28, 1955 Referring Provider:     PULMONARY REHAB OTHER RESP ORIENTATION from 10/01/2019 in [place]  Referring Provider Dr. Vaughan Browner       Number of Visits: 4  Reason for Discharge:  Early Exit:  Personal  Smoking History:  Social History   Tobacco Use  Smoking Status Former Smoker  . Packs/day: 2.00  . Years: 40.00  . Pack years: 80.00  . Quit date: 10/24/2015  . Years since quitting: 4.1  Smokeless Tobacco Never Used    Diagnosis:  Chronic obstructive pulmonary disease, unspecified COPD type ([place])  ADL UCSD:  Pulmonary Assessment Scores    Row Name 10/01/19 0950         ADL UCSD   ADL Phase Entry     SOB Score total 49     Rest 0     Walk 3     Stairs 4     Bath 0     Dress 1     Shop 3       CAT Score   CAT Score 15       mMRC Score   mMRC Score 2            Initial Exercise Prescription:  Initial Exercise Prescription - 10/01/19 1200      Date of Initial Exercise RX and Referring Provider   Date 10/01/19    Referring Provider Dr. Vaughan Browner    Expected Discharge Date 01/01/20      NuStep   Level 1    SPM 80    Minutes 22      Arm Ergometer   Level 1    RPM 60    Minutes 17      Prescription Details   Frequency (times per week) 2    Duration Progress to 30 minutes of continuous aerobic without signs/symptoms of physical distress      Intensity   THRR 40-80% of Max Heartrate 62-123    Ratings of Perceived Exertion 11-13    Perceived Dyspnea 0-4      Progression   Progression Continue progressive overload as per policy without signs/symptoms or physical distress.      Resistance Training   Training Prescription Yes    Weight 1 lbs    Reps 10-15           Discharge Exercise Prescription (Final Exercise Prescription Changes):  Exercise Prescription Changes - 10/18/19 1435      Response to Exercise   Blood  Pressure (Admit) 110/62    Blood Pressure (Exercise) 118/66    Blood Pressure (Exit) 110/58    Heart Rate (Admit) 86 bpm    Heart Rate (Exercise) 96 bpm    Heart Rate (Exit) 89 bpm    Oxygen Saturation (Admit) 93 %    Oxygen Saturation (Exercise) 90 %    Oxygen Saturation (Exit) 91 %    Rating of Perceived Exertion (Exercise) 13    Perceived Dyspnea (Exercise) 13    Duration Continue with 30 min of aerobic exercise without signs/symptoms of physical distress.    Intensity THRR unchanged      Progression   Progression Continue to progress workloads to maintain intensity without signs/symptoms of physical distress.      Resistance Training   Training Prescription Yes    Weight 1 lbs    Reps 10-15      NuStep  Level 1    SPM 82    Minutes 22    METs 1.06      Arm Ergometer   Level 1    Watts 11    RPM 48    Minutes 17    METs 1.8           Functional Capacity:  6 Minute Walk    Row Name 10/01/19 1157         6 Minute Walk   Distance 1150 feet     Distance % Change 0 %     Distance Feet Change 0 ft     Walk Time 6 minutes     # of Rest Breaks 0     MPH 2.18     METS 2.65     RPE 13     Perceived Dyspnea  13     VO2 Peak 9.27     Symptoms Yes (comment)     Comments SpO2 dropped to 85%, and rose back to 90% after about 15 seconds as I was going to get O2. It did not drop again during the remainder of the test.     Resting HR 71 bpm     Resting BP 108/60     Resting Oxygen Saturation  92 %     Exercise Oxygen Saturation  during 6 min walk 85 %     Max Ex. HR 96 bpm     Max Ex. BP 142/74     2 Minute Post BP 120/74       Interval HR   1 Minute HR 83     2 Minute HR 91     3 Minute HR 81     4 Minute HR 89     5 Minute HR 90     6 Minute HR 96     2 Minute Post HR 77     Interval Heart Rate? Yes       Interval Oxygen   Interval Oxygen? Yes     Baseline Oxygen Saturation % 92 %     1 Minute Oxygen Saturation % 91 %     1 Minute Liters of Oxygen 0  L     2 Minute Oxygen Saturation % 85 %     2 Minute Liters of Oxygen 0 L     3 Minute Oxygen Saturation % 90 %     3 Minute Liters of Oxygen 0 L     4 Minute Oxygen Saturation % 91 %     4 Minute Liters of Oxygen 0 L     5 Minute Oxygen Saturation % 90 %     5 Minute Liters of Oxygen 0 L     6 Minute Oxygen Saturation % 90 %     6 Minute Liters of Oxygen 0 L     2 Minute Post Oxygen Saturation % 92 %     2 Minute Post Liters of Oxygen 0 L            Psychological, QOL, Others - Outcomes: PHQ 2/9: Depression screen [facility] 2/9 10/01/2019 11/07/2018 01/31/2017  Decreased Interest 0 0 0  Down, Depressed, Hopeless 0 0 0  PHQ - 2 Score 0 0 0  Altered sleeping 0 - 0  Tired, decreased energy 0 - 2  Change in appetite 0 - 0  Feeling bad or failure about yourself  0 - 0  Trouble concentrating 0 - 0  Moving slowly  or fidgety/restless 0 - 0  Suicidal thoughts 0 - 0  PHQ-9 Score 0 - 2  Difficult doing work/chores Not difficult at all - Somewhat difficult  Some recent data might be hidden    Quality of Life:  Quality of Life - 10/01/19 1137      Quality of Life   Select Quality of Life      Quality of Life Scores   Health/Function Pre 28.78 %    Socioeconomic Pre 30 %    Psych/Spiritual Pre 30 %    Family Pre 30 %    GLOBAL Pre 29.44 %           Personal Goals: Goals established at orientation with interventions provided to work toward goal.  Personal Goals and Risk Factors at Admission - 10/01/19 1001      Core Components/Risk Factors/Patient Goals on Admission    Weight Management Obesity    Improve shortness of breath with ADL's Yes    Intervention Provide education, individualized exercise plan and daily activity instruction to help decrease symptoms of SOB with activities of daily living.    Expected Outcomes Short Term: Improve cardiorespiratory fitness to achieve a reduction of symptoms when performing ADLs;Long Term: Be able to perform more ADLs without symptoms or  delay the onset of symptoms    Personal Goal Other Yes    Personal Goal Build stamina and improve lung capacity to breathe better. Lose 10 lbs.    Intervention Patient will attend PR 2 days/week and supplement with exercise at home 3 days/week.    Expected Outcomes Patient will meet both program and personal gaols.            Personal Goals Discharge:  Goals and Risk Factor Review    Row Name 10/17/19 1500 11/15/19 1455           Core Components/Risk Factors/Patient Goals Review   Personal Goals Review Improve shortness of breath with ADL's;Weight Management/Obesity  Build stamina; improve lung capacity; lose 10 lbs. Improve shortness of breath with ADL's;Weight Management/Obesity;Other  Build stamina; improve lung capacity; lose 10 lbs.      Review Patient is new to the program. She has completed 3 sessions. Will continue to monitor for progress as she works toward meeting her personal goals. Patient has attended 1 session since last 30 day review. She was hopitalized 10/24/19  and discharged 11/06/19 with acute hypoxic respiratory failure secondary to pneumonia complicated by a right pleural effusion. She was transported to ED via EMS 11/12/19 following several episodes of syncope. She was evaluated by cardiology and put on a heart monitor for 2 weeks to r/o an arrhythmia. Hopefully she will be able to return to PR after she gets clearance from MD. Will continue to monitor for progress.      Expected Outcomes Patient will complete the program meeting both personal and program goals. Patient will complete the program meeting both personal and program goals.             Exercise Goals and Review:  Exercise Goals    Row Name 10/01/19 1203 10/17/19 0847 11/14/19 0825         Exercise Goals   Increase Physical Activity Yes Yes Yes     Intervention Provide advice, education, support and counseling about physical activity/exercise needs.;Develop an individualized exercise prescription for  aerobic and resistive training based on initial evaluation findings, risk stratification, comorbidities and participant's personal goals. Provide advice, education, support and counseling about physical activity/exercise needs.;Develop  an individualized exercise prescription for aerobic and resistive training based on initial evaluation findings, risk stratification, comorbidities and participant's personal goals. Provide advice, education, support and counseling about physical activity/exercise needs.;Develop an individualized exercise prescription for aerobic and resistive training based on initial evaluation findings, risk stratification, comorbidities and participant's personal goals.     Expected Outcomes Short Term: Attend rehab on a regular basis to increase amount of physical activity.;Long Term: Add in home exercise to make exercise part of routine and to increase amount of physical activity.;Long Term: Exercising regularly at least 3-5 days a week. Short Term: Attend rehab on a regular basis to increase amount of physical activity.;Long Term: Add in home exercise to make exercise part of routine and to increase amount of physical activity.;Long Term: Exercising regularly at least 3-5 days a week. Short Term: Attend rehab on a regular basis to increase amount of physical activity.;Long Term: Add in home exercise to make exercise part of routine and to increase amount of physical activity.;Long Term: Exercising regularly at least 3-5 days a week.     Increase Strength and Stamina Yes Yes Yes     Intervention Provide advice, education, support and counseling about physical activity/exercise needs.;Develop an individualized exercise prescription for aerobic and resistive training based on initial evaluation findings, risk stratification, comorbidities and participant's personal goals. Provide advice, education, support and counseling about physical activity/exercise needs.;Develop an individualized exercise  prescription for aerobic and resistive training based on initial evaluation findings, risk stratification, comorbidities and participant's personal goals. Provide advice, education, support and counseling about physical activity/exercise needs.;Develop an individualized exercise prescription for aerobic and resistive training based on initial evaluation findings, risk stratification, comorbidities and participant's personal goals.     Expected Outcomes Short Term: Increase workloads from initial exercise prescription for resistance, speed, and METs.;Short Term: Perform resistance training exercises routinely during rehab and add in resistance training at home;Long Term: Improve cardiorespiratory fitness, muscular endurance and strength as measured by increased METs and functional capacity (6MWT) Short Term: Increase workloads from initial exercise prescription for resistance, speed, and METs.;Short Term: Perform resistance training exercises routinely during rehab and add in resistance training at home;Long Term: Improve cardiorespiratory fitness, muscular endurance and strength as measured by increased METs and functional capacity (6MWT) Short Term: Increase workloads from initial exercise prescription for resistance, speed, and METs.;Short Term: Perform resistance training exercises routinely during rehab and add in resistance training at home;Long Term: Improve cardiorespiratory fitness, muscular endurance and strength as measured by increased METs and functional capacity (6MWT)     Able to understand and use rate of perceived exertion (RPE) scale Yes Yes Yes     Intervention Provide education and explanation on how to use RPE scale Provide education and explanation on how to use RPE scale Provide education and explanation on how to use RPE scale     Expected Outcomes Short Term: Able to use RPE daily in rehab to express subjective intensity level;Long Term:  Able to use RPE to guide intensity level when  exercising independently Short Term: Able to use RPE daily in rehab to express subjective intensity level;Long Term:  Able to use RPE to guide intensity level when exercising independently Short Term: Able to use RPE daily in rehab to express subjective intensity level;Long Term:  Able to use RPE to guide intensity level when exercising independently     Able to understand and use Dyspnea scale Yes Yes Yes     Intervention Provide education and explanation on how to use  Dyspnea scale Provide education and explanation on how to use Dyspnea scale Provide education and explanation on how to use Dyspnea scale     Expected Outcomes Short Term: Able to use Dyspnea scale daily in rehab to express subjective sense of shortness of breath during exertion;Long Term: Able to use Dyspnea scale to guide intensity level when exercising independently Short Term: Able to use Dyspnea scale daily in rehab to express subjective sense of shortness of breath during exertion;Long Term: Able to use Dyspnea scale to guide intensity level when exercising independently Short Term: Able to use Dyspnea scale daily in rehab to express subjective sense of shortness of breath during exertion;Long Term: Able to use Dyspnea scale to guide intensity level when exercising independently     Knowledge and understanding of Target Heart Rate Range (THRR) Yes Yes Yes     Intervention Provide education and explanation of THRR including how the numbers were predicted and where they are located for reference Provide education and explanation of THRR including how the numbers were predicted and where they are located for reference Provide education and explanation of THRR including how the numbers were predicted and where they are located for reference     Expected Outcomes Short Term: Able to state/look up THRR;Long Term: Able to use THRR to govern intensity when exercising independently;Short Term: Able to use daily as guideline for intensity in rehab  Short Term: Able to state/look up THRR;Long Term: Able to use THRR to govern intensity when exercising independently;Short Term: Able to use daily as guideline for intensity in rehab Short Term: Able to state/look up THRR;Long Term: Able to use THRR to govern intensity when exercising independently;Short Term: Able to use daily as guideline for intensity in rehab     Understanding of Exercise Prescription Yes Yes Yes     Intervention Provide education, explanation, and written materials on patient's individual exercise prescription Provide education, explanation, and written materials on patient's individual exercise prescription Provide education, explanation, and written materials on patient's individual exercise prescription     Expected Outcomes Long Term: Able to explain home exercise prescription to exercise independently;Short Term: Able to explain program exercise prescription Long Term: Able to explain home exercise prescription to exercise independently;Short Term: Able to explain program exercise prescription Long Term: Able to explain home exercise prescription to exercise independently;Short Term: Able to explain program exercise prescription            Exercise Goals Re-Evaluation:  Exercise Goals Re-Evaluation    [place] Name 10/17/19 0848 11/14/19 0826           Exercise Goal Re-Evaluation   Exercise Goals Review Increase Physical Activity;Increase Strength and Stamina;Able to understand and use rate of perceived exertion (RPE) scale;Able to understand and use Dyspnea scale;Knowledge and understanding of Target Heart Rate Range (THRR);Understanding of Exercise Prescription Increase Physical Activity;Increase Strength and Stamina;Able to understand and use rate of perceived exertion (RPE) scale;Able to understand and use Dyspnea scale;Knowledge and understanding of Target Heart Rate Range (THRR);Understanding of Exercise Prescription      Comments Pt has attended 2 exercise sessions. Her  O2 levels have remained in the low 90s on RA so far. I was unsure if she would need O2 or not when she first completed her walk test. She is currently exercising at 1.9 METs on the stepper. Will continue to monitor and progess as able. Pt has only attended 3 exercise sessions and has been absent since 10/18/19. She was admitted to the hospital for  several weeks. OT recommended HHOT when she was discharged so it is unclear if she will be able to rejoin the program. She was requiring 2L of O2 upon discharge from the hospital. Will monitor and progress if able.      Expected Outcomes Through exercise at rehab and by engaging in a home exercise program, the pt will reach their goals. Through exercise at rehab and by engaging in a home exercise program, the pt will reach their goals.             Nutrition & Weight - Outcomes:  Pre Biometrics - 10/01/19 1200      Pre Biometrics   Waist Circumference 47.5 inches    Hip Circumference 48 inches    Waist to Hip Ratio 0.99 %    Triceps Skinfold 21 mm    % Body Fat 33.7 %    Grip Strength 20.1 kg    Flexibility 20 in    Single Leg Stand 2.1 seconds            Nutrition:  Nutrition Therapy & Goals - 10/17/19 1456      Personal Nutrition Goals   Comments Patient continues to work on eating healthier. Will continue to monitor.      Intervention Plan   Intervention Nutrition handout(s) given to patient.           Nutrition Discharge:  Nutrition Assessments - 10/01/19 0952      MEDFICTS Scores   Pre Score 59           Education Questionnaire Score:  Knowledge Questionnaire Score - 10/01/19 0953      Knowledge Questionnaire Score   Pre Score 16/18          Patient discharged from the program due to acute/chronic illness. She completed 4 sessions. MD will be notified

## 2019-12-12 NOTE — Addendum Note (Signed)
Encounter addended by: Dwana Melena, RN on: 12/12/2019 1:41 PM  Actions taken: Flowsheet accepted, Clinical Note Signed, Episode resolved

## 2019-12-13 ENCOUNTER — Encounter (HOSPITAL_COMMUNITY): Payer: Medicare HMO

## 2019-12-14 DIAGNOSIS — Z6830 Body mass index (BMI) 30.0-30.9, adult: Secondary | ICD-10-CM | POA: Diagnosis not present

## 2019-12-14 DIAGNOSIS — J440 Chronic obstructive pulmonary disease with acute lower respiratory infection: Secondary | ICD-10-CM | POA: Diagnosis not present

## 2019-12-14 DIAGNOSIS — J439 Emphysema, unspecified: Secondary | ICD-10-CM | POA: Diagnosis not present

## 2019-12-14 DIAGNOSIS — Z87891 Personal history of nicotine dependence: Secondary | ICD-10-CM | POA: Diagnosis not present

## 2019-12-14 DIAGNOSIS — Z299 Encounter for prophylactic measures, unspecified: Secondary | ICD-10-CM | POA: Diagnosis not present

## 2019-12-14 DIAGNOSIS — J209 Acute bronchitis, unspecified: Secondary | ICD-10-CM | POA: Diagnosis not present

## 2019-12-14 DIAGNOSIS — I2699 Other pulmonary embolism without acute cor pulmonale: Secondary | ICD-10-CM | POA: Diagnosis not present

## 2019-12-18 ENCOUNTER — Encounter (HOSPITAL_COMMUNITY): Payer: Medicare HMO

## 2019-12-20 ENCOUNTER — Encounter (HOSPITAL_COMMUNITY): Payer: Medicare HMO

## 2019-12-21 ENCOUNTER — Telehealth (INDEPENDENT_AMBULATORY_CARE_PROVIDER_SITE_OTHER): Payer: Medicare HMO | Admitting: Student

## 2019-12-21 ENCOUNTER — Other Ambulatory Visit: Payer: Self-pay

## 2019-12-21 ENCOUNTER — Encounter: Payer: Self-pay | Admitting: Student

## 2019-12-21 VITALS — BP 119/71 | HR 97 | Ht 65.0 in | Wt 162.0 lb

## 2019-12-21 DIAGNOSIS — I350 Nonrheumatic aortic (valve) stenosis: Secondary | ICD-10-CM | POA: Diagnosis not present

## 2019-12-21 DIAGNOSIS — R55 Syncope and collapse: Secondary | ICD-10-CM

## 2019-12-21 DIAGNOSIS — I2510 Atherosclerotic heart disease of native coronary artery without angina pectoris: Secondary | ICD-10-CM

## 2019-12-21 DIAGNOSIS — I10 Essential (primary) hypertension: Secondary | ICD-10-CM

## 2019-12-21 DIAGNOSIS — Z79899 Other long term (current) drug therapy: Secondary | ICD-10-CM

## 2019-12-21 DIAGNOSIS — Z8679 Personal history of other diseases of the circulatory system: Secondary | ICD-10-CM

## 2019-12-21 MED ORDER — METOPROLOL SUCCINATE ER 50 MG PO TB24: 50.0000 mg | ORAL_TABLET | Freq: Every day | ORAL | 3 refills | Status: DC

## 2019-12-21 MED ORDER — ASPIRIN EC 81 MG PO TBEC: 81.0000 mg | DELAYED_RELEASE_TABLET | Freq: Every day | ORAL | 3 refills | Status: DC

## 2019-12-21 MED ORDER — ISOSORBIDE MONONITRATE ER 30 MG PO TB24: 15.0000 mg | ORAL_TABLET | Freq: Every day | ORAL | 3 refills | Status: DC

## 2019-12-21 NOTE — Progress Notes (Addendum)
Virtual Visit via Video Note   This visit type was conducted due to national recommendations for restrictions regarding the COVID-19 Pandemic (e.g. social distancing) in an effort to limit this patient's exposure and mitigate transmission in our community.  Due to her co-morbid illnesses, this patient is at least at moderate risk for complications without adequate follow up.  This format is felt to be most appropriate for this patient at this time.  All issues noted in this document were discussed and addressed.  A limited physical exam was performed with this format.  Please refer to the patient's chart for her consent to telehealth for [facility].     Date:  12/22/2019   ID:  Emily Rocha, DOB [birth year], MRN [id number] The patient was identified using 2 identifiers.  Patient Location: Home Provider Location: Office/Clinic  PCP:  No primary care provider on file.  Cardiologist:  Carlyle Dolly, MD  Electrophysiologist:  None   Evaluation Performed:  Follow-Up Visit  Chief Complaint: Hospital Follow-up  History of Present Illness:    Emily Rocha is a 64 y.o. female with past medical history of CAD (s/p NSTEMI in 11/2016 and required CABG with LIMA-LAD, SVG-LCx and SVG-D1 and complicated by cardiogenic shock, NST in 10/2018 showing no ischemia), history of ischemic cardiomyopathy (EF 45-50% in 11/2016, normalized by repeat imaging), moderate to severe AS, HTN, HLD, COPD, history of lung cancer (s/p wedge resection) and history of TIA's (s/p right subclavian and right common carotid bypass in 12/2017) who presents for a hospital follow-up telehealth visit.   She was admitted to [facility] from 9/8 - 11/06/2019 for acute hypoxic respiratory failure which was felt to be multifactorial in the setting of PNA, loculated pleural effusion and a COPD exacerbation. She was treated with steroids along with antibiotic therapy. Was also diagnosed with aspiration pneumonia and an acute pulmonary  artery thrombus and it was recommended by Pulmonology to be on anticoagulation for the next 3 to 6 months.  She was again admitted to [facility] from 11/12/2019 - 11/13/2019 for evaluation of multiple syncopal episodes and she was found on the ground by EMS. Imaging during admission did show a subdural hematoma and Neurosurgery recommended discontinuing Eliquis and nonoperative management. A repeat echocardiogram was performed and showed a preserved EF of 60 to 65%. This was read as showing moderate to severe aortic stenosis and Dr. Harl Bowie felt it was more consistent with a moderate range and likely did not play a role in her syncopal episodes. She did have soft blood pressures during admission and was recommended to remain off Toprol-XL and Imdur until outpatient follow-up. Was also recommended to have an outpatient monitor so to assess for any significant arrhythmias.  While her monitor has not officially resulted the preliminary report shows predominantly sinus rhythm with an average heart rate of 98 bpm. She did have 2 episodes of NSVT with the longest being 9 beats. She also had frequent episodes of SVT with the longest lasting 22.9 seconds. There was no significant bradycardia or pauses.  In talking with the patient today, she switched to a Virtual Visit as she was diagnosed with COVID-19 last week. Says her breathing has overall been doing well but she mostly feels fatigued. Says her breathing has improved since her hospitalization and she is no longer having to use supplemental oxygen. Denies any recent chest pain or palpitations. No recent orthopnea, PND or edema. No recurrent syncopal episodes. She was confused about her medication regimen  and has remained on ASA 81mg  daily, Eliquis, Toprol-XL and Imdur since her hospitalization.    Past Medical History:  Diagnosis Date  . Anxiety   . Asthma   . COPD (chronic obstructive pulmonary disease) ([place])   . Coronary artery disease    hx CABG  . GERD  (gastroesophageal reflux disease)   . Peripheral vascular disease ([place])   . Pneumonia   . Shortness of breath dyspnea   . Stroke [facility])    "mini stroke" numbness and weakness to left side of the body   Past Surgical History:  Procedure Laterality Date  . AORTIC ARCH ANGIOGRAPHY N/A 11/04/2017   Procedure: AORTIC ARCH ANGIOGRAPHY;  Surgeon: Elam Dutch, MD;  Location: [place] CV LAB;  Service: Cardiovascular;  Laterality: N/A;  . APPLICATION OF WOUND VAC N/A 01/06/2018   Procedure: APPLICATION OF WOUND VAC;  Surgeon: Ivin Poot, MD;  Location: [place];  Service: Thoracic;  Laterality: N/A;  . APPLICATION OF WOUND VAC N/A 01/13/2018   Procedure: WOUND VAC CHANGE;  Surgeon: Ivin Poot, MD;  Location: [place];  Service: Thoracic;  Laterality: N/A;  . CARDIAC CATHETERIZATION    . CAROTID-SUBCLAVIAN BYPASS GRAFT Right 12/26/2017   Procedure: AORTIC TO RIGHT COMMON CAROTID AND RIGHT SUBCLAVIAN  ARTERY  BYPASS;  Surgeon: Elam Dutch, MD;  Location: [place];  Service: Vascular;  Laterality: Right;  . CHOLECYSTECTOMY    . CLOSURE OF DIAPHRAGM  12/26/2017   Procedure: REPAIR OF DIAPHRAGM;  Surgeon: Ivin Poot, MD;  Location: [place];  Service: Thoracic;;  . COLONOSCOPY N/A 10/03/2014   Procedure: COLONOSCOPY;  Surgeon: Rogene [place], MD;  [facility];  Service: Endoscopy;  Laterality: N/A;  730  . CORONARY ARTERY BYPASS GRAFT N/A 12/14/2016   Procedure: CORONARY ARTERY BYPASS GRAFTING (CABG) x three , using left internal mammary artery and right leg greater saphenous vein harvested endoscopically;  Surgeon: Ivin Poot, MD;  Location: [place];  Service: Open Heart Surgery;  Laterality: N/A;  . ENDARTERECTOMY Right 12/06/2014   Procedure: ENDARTERECTOMY CAROTid;  Surgeon: Elam Dutch, MD;  Location: [facility];  Service: Vascular;  Laterality: Right;  . ESOPHAGOGASTRODUODENOSCOPY N/A 12/03/2016   Procedure: ESOPHAGOGASTRODUODENOSCOPY (EGD);  Surgeon: Rogene [place], MD;  [facility];  Service: Endoscopy;  Laterality: N/A;  7:30  . LEFT HEART CATH AND CORONARY ANGIOGRAPHY N/A 12/14/2016   Procedure: LEFT HEART CATH AND CORONARY ANGIOGRAPHY;  Surgeon: Burnell Blanks, MD;  Location: [place] CV LAB;  Service: Cardiovascular;  Laterality: N/A;  . LOBECTOMY Right 10/26/2018   Procedure: RIGHT UPPER LOBECTOMY;  Surgeon: Lajuana Matte, MD;  Location: [place];  Service: Thoracic;  Laterality: Right;  . PERIPHERAL VASCULAR CATHETERIZATION N/A 12/05/2014   Procedure:  Carotid Arch Angiography;  Surgeon: [place], MD;  Location: [place] CV LAB;  Service: Cardiovascular;  Laterality: N/A;  . STERNAL WOUND DEBRIDEMENT N/A 01/06/2018   Procedure: STERNAL WOUND DEBRIDEMENT;  Surgeon: Ivin Poot, MD;  Location: [place];  Service: Thoracic;  Laterality: N/A;  . STERNOTOMY N/A 12/26/2017   Procedure: REDO STERNOTOMY;  Surgeon: Prescott Gum, Collier Salina, MD;  Location: [place];  Service: Thoracic;  Laterality: N/A;  . TEE WITHOUT CARDIOVERSION N/A 12/14/2016   Procedure: TRANSESOPHAGEAL ECHOCARDIOGRAM (TEE);  Surgeon: Prescott Gum, Collier Salina, MD;  Location: [place];  Service: Open Heart Surgery;  Laterality: N/A;  . VIDEO ASSISTED THORACOSCOPY (VATS)/WEDGE RESECTION Right 10/26/2018   Procedure: VIDEO ASSISTED  THORACOSCOPY (VATS)/WEDGE RESECTION;  Surgeon: Lajuana Matte, MD;  Location: [place];  Service: Thoracic;  Laterality: Right;  Marland Kitchen VIDEO BRONCHOSCOPY N/A 10/26/2018   Procedure: VIDEO BRONCHOSCOPY;  Surgeon: Lajuana Matte, MD;  [facility];  Service: Thoracic;  Laterality: N/A;     Current Meds  Medication Sig  . albuterol (PROVENTIL) (2.5 MG/3ML) 0.083% nebulizer solution Take 2.5 mg by nebulization every 4 (four) hours as needed.  . ALPRAZolam (XANAX) 0.5 MG tablet Take 0.5 mg by mouth 3 (three) times daily.   . Ascorbic Acid (VITAMIN C GUMMIE) 120 MG CHEW Chew 500 mg by mouth.  Marland Kitchen atorvastatin (LIPITOR) 40 MG tablet Take 1  tablet (40 mg total) daily by mouth.  . calcium carbonate (OS-CAL) 1250 (500 Ca) MG chewable tablet Chew 1 tablet by mouth daily.  . cholecalciferol (VITAMIN D3) 25 MCG (1000 UNIT) tablet Take 1,000 Units by mouth daily.  . diphenhydramine-acetaminophen (TYLENOL PM) 25-500 MG TABS tablet Take 2-3 tablets by mouth at bedtime.  Marland Kitchen guaiFENesin (MUCINEX) 600 MG 12 hr tablet Take 1 tablet (600 mg total) by mouth 2 (two) times daily as needed.  . Multiple Vitamin (MULTIVITAMIN WITH MINERALS) TABS tablet Take 1 tablet by mouth daily.  . pantoprazole (PROTONIX) 40 MG tablet Take 40 mg by mouth daily.  . potassium chloride SA (KLOR-CON) 20 MEQ tablet Take 1 tablet (20 mEq total) by mouth every Monday, Wednesday, and Friday.  Marland Kitchen QUEtiapine (SEROQUEL) 25 MG tablet Take 1 tablet (25 mg total) by mouth at bedtime.  . Tiotropium Bromide-Olodaterol (STIOLTO RESPIMAT) 2.5-2.5 MCG/ACT AERS Inhale 2 puffs into the lungs daily.  Marland Kitchen torsemide (DEMADEX) 20 MG tablet Take 1 tablet (20 mg total) by mouth every Monday, Wednesday, and Friday.     Allergies:   Patient has no known allergies.   Social History   Tobacco Use  . Smoking status: Former Smoker    Packs/day: 2.00    Years: 40.00    Pack years: 80.00    Quit date: 10/24/2015    Years since quitting: 4.1  . Smokeless tobacco: Never Used  Vaping Use  . Vaping Use: Former  . Start date: 10/23/2013  . Quit date: 10/24/2015  Substance Use Topics  . Alcohol use: No    Alcohol/week: 0.0 standard drinks  . Drug use: No     Family Hx: The patient's family history includes Cancer in her mother; Diabetes in her mother; Heart attack in her father and mother; Heart disease in her father and mother.  ROS:   Please see the history of present illness.     All other systems reviewed and are negative.   Prior CV studies:   The following studies were reviewed today:  Cardiac Catheterization: 11/2016  Mid RCA lesion, 60 %stenosed.  Prox RCA lesion, 40  %stenosed.  Ost RPDA to RPDA lesion, 90 %stenosed.  Ost Cx to Prox Cx lesion, 100 %stenosed.  Mid LAD lesion, 80 %stenosed.  Ost 2nd Diag to 2nd Diag lesion, 40 %stenosed.  Prox LAD to Mid LAD lesion, 40 %stenosed.  Ost LM to LM lesion, 99 %stenosed.   1. Severe distal left main stenosis 2. Severe mid LAD stenosis 3. 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  4. Moderately severe, calcified stenosis of the mid RCA 5. Elevated filling pressures.   Recommendations: Will call CT surgery for urgent CABG.    Echocardiogram: 10/2019 IMPRESSIONS    1. Left ventricular ejection  fraction, by estimation, is 60 to 65%. The  left ventricle has normal function. The left ventricle has no regional  wall motion abnormalities. Left ventricular diastolic parameters are  consistent with Grade II diastolic  dysfunction (pseudonormalization). Elevated left ventricular end-diastolic  pressure.  2. Right ventricular systolic function is normal. The right ventricular  size is normal. There is normal pulmonary artery systolic pressure.  3. Left atrial size was moderately dilated.  4. The mitral valve is degenerative. Mild mitral valve regurgitation. No  evidence of mitral stenosis. Moderate mitral annular calcification.  5. The aortic valve was not well visualized. There is moderate  calcification of the aortic valve. There is severe thickening of the  aortic valve. Aortic valve regurgitation is mild. Moderate to severe  aortic valve stenosis. Aortic valve area, by VTI  measures 1.03 cm. Aortic valve mean gradient measures 37.0 mmHg. Aortic  valve Vmax measures 3.93 m/s.  6. The inferior vena cava is normal in size with greater than 50%  respiratory variability, suggesting right atrial pressure of 3 mmHg.    Event Monitor: 10/2019 Patient had a min HR of 66 bpm, max HR of 218 bpm, and avg HR of 98 bpm. Predominant underlying  rhythm was Sinus Rhythm. 2 Ventricular Tachycardia runs occurred, the run with the fastest interval lasting 9 beats with a max rate of 184 bpm (avg 166 bpm); the run with the fastest interval was also the longest. 64 Supraventricular Tachycardia runs occurred, the run with the fastest interval lasting 7 beats with a max rate of 218 bpm, the longest lasting 22.9 secs with an avg rate of 135 bpm. Isolated SVEs were occasional (2.9%, 05397), SVE Couplets were rare (<1.0%, 911), and SVE Triplets were rare (<1.0%, 252). Isolated VEs were rare (<1.0%), VE Couplets were rare (<1.0%), and no VE Triplets were present. Ventricular Bigeminy and Trigeminy were present.  Labs/Other Tests and Data Reviewed:    EKG:  No ECG reviewed.  Recent Labs: 10/28/2019: TSH 1.767 11/12/2019: B Natriuretic Peptide 337.0 11/13/2019: ALT 18; BUN 7; Creatinine, Ser 0.38; Hemoglobin 9.6; Magnesium 2.0; Platelets 193; Potassium 3.6; Sodium 140   Recent Lipid Panel Lab Results  Component Value Date/Time   CHOL 205 (H) 12/13/2016 10:23 AM   TRIG 151 (H) 10/24/2019 01:16 PM   HDL 68 12/13/2016 10:23 AM   CHOLHDL 3.0 12/13/2016 10:23 AM   LDLCALC 117 (H) 12/13/2016 10:23 AM    Wt Readings from Last 3 Encounters:  12/21/19 162 lb (73.5 kg)  11/12/19 178 lb 9.2 oz (81 kg)  11/06/19 178 lb 12.7 oz (81.1 kg)     Objective:    Vital Signs:  BP 119/71   Pulse 97   Ht 5\' 5"  (1.651 m)   Wt 162 lb (73.5 kg)   BMI 26.96 kg/m    General: Pleasant female appearing in NAD Psych: Normal affect. Neuro: Alert and oriented X 3. Moves all extremities spontaneously. Lungs:  Resp appear regular and unlabored by video assessment.   ASSESSMENT & PLAN:    1. CAD - She is s/p NSTEMI in 11/2016 and required CABG with LIMA-LAD, SVG-LCx and SVG-D1 and complicated by cardiogenic shock. Most recent ischemic evaluation was a NST in 10/2018 which showed no ischemia. - She denies any recent chest pain and breathing has improved since  her hospitalization. Continue ASA 81mg  daily, Imdur 15mg  daily, Toprol-XL 50mg  daily and Atorvastatin 40mg  daily.   2. History of Ischemic Cardiomyopathy - Her EF was previously reduced at 45-50% in  11/2016, normalized by repeat imaging and at 60-65% by most recent imaging. Continue Torsemide 20mg  MWF. Will recheck BMET with upcoming labs.   3. Moderate AS - Recent echocardiogram was reviewed by Dr. Harl Bowie during her admission and felt to be most consistent with moderate AS. Will continue to follow.   4. HTN - BP has been well-controlled when checked at home, at 119/71 on most recent check. Continue current medication regimen with Imdur 15mg  daily and Toprol-XL 50mg  daily.   5. Syncopal Episode - She did have a subdural hematoma during her recent admission and was recommended to stop Eliquis (previously on this for acute pulmonary artery thrombus) but she has continued on this. Denies any neurological issues. Recommended stopping Eliquis as discussed during her admission until follow-up with Neurosurgery. I will send a staff message to Neurosurgery to make sure she has follow-up arranged. Her monitor did show episodes of NSVT and SVT as outlined above but no significant bradycardia or pauses. Will recheck CBC, BMET and Mg given her ectopy. Recommended continuing Toprol-XL and continuing to follow HR. If HR and BP allow, would consider further titration of this.    COVID-19 Education: The signs and symptoms of COVID-19 were discussed with the patient and how to seek care for testing (follow up with PCP or arrange E-visit).  The importance of social distancing was discussed today.  Time:   Today, I have spent 18 minutes with the patient with telehealth technology discussing the above problems.     Medication Adjustments/Labs and Tests Ordered: Current medicines are reviewed at length with the patient today.  Concerns regarding medicines are outlined above.   Tests Ordered: Orders Placed This  Encounter  Procedures  . CBC  . Basic Metabolic Panel (BMET)  . Magnesium    Medication Changes: Meds ordered this encounter  Medications  . aspirin EC 81 MG tablet    Sig: Take 1 tablet (81 mg total) by mouth daily. Swallow whole.    Dispense:  90 tablet    Refill:  3  . metoprolol succinate (TOPROL-XL) 50 MG 24 hr tablet    Sig: Take 1 tablet (50 mg total) by mouth daily. Take with or immediately following a meal.    Dispense:  90 tablet    Refill:  3  . isosorbide mononitrate (IMDUR) 30 MG 24 hr tablet    Sig: Take 0.5 tablets (15 mg total) by mouth daily.    Dispense:  45 tablet    Refill:  3    Follow Up: Keep scheduled follow-up with Dr. Harl Bowie in 02/2020.  Signed, Erma Heritage, PA-C  12/22/2019 9:25 AM    [place]

## 2019-12-21 NOTE — Patient Instructions (Addendum)
Medication Instructions:  STOP Eliquis  Re-start Imdur 15 mg daily   Re-start Toprol XL 50 mg daily    *If you need a refill on your cardiac medications before your next appointment, please call your pharmacy*   Lab Work:  CBC,BMET, Magnesium in 1-2 weeks  If you have labs (blood work) drawn today and your tests are completely normal, you will receive your results only by: Marland Kitchen MyChart Message (if you have MyChart) OR . A paper copy in the mail If you have any lab test that is abnormal or we need to change your treatment, we will call you to review the results.   Testing/Procedures: None today   Follow-Up: At [facility], you and your health needs are our priority.  As part of our continuing mission to provide you with exceptional heart care, we have created designated Provider Care Teams.  These Care Teams include your primary Cardiologist (physician) and Advanced Practice Providers (APPs -  Physician Assistants and Nurse Practitioners) who all work together to provide you with the care you need, when you need it.  We recommend signing up for the patient portal called "MyChart".  Sign up information is provided on this After Visit Summary.  MyChart is used to connect with patients for Virtual Visits (Telemedicine).  Patients are able to view lab/test results, encounter notes, upcoming appointments, etc.  Non-urgent messages can be sent to your provider as well.   To learn more about what you can do with MyChart, go to [email or web address].    Your next appointment:    On Tuesday, January 25 th at 1:20 pm with Dr.Branch in the [place] office.  The format for your next appointment:   In Person  Provider:  Dr.Branch    Other Instructions  Continue to check your heart rate at home.      Thank you for choosing [facility] !

## 2019-12-22 ENCOUNTER — Encounter: Payer: Self-pay | Admitting: Student

## 2019-12-24 DIAGNOSIS — I4891 Unspecified atrial fibrillation: Secondary | ICD-10-CM | POA: Diagnosis not present

## 2019-12-24 DIAGNOSIS — U071 COVID-19: Secondary | ICD-10-CM | POA: Diagnosis not present

## 2019-12-24 DIAGNOSIS — J439 Emphysema, unspecified: Secondary | ICD-10-CM | POA: Diagnosis not present

## 2019-12-24 DIAGNOSIS — Z299 Encounter for prophylactic measures, unspecified: Secondary | ICD-10-CM | POA: Diagnosis not present

## 2019-12-24 DIAGNOSIS — J441 Chronic obstructive pulmonary disease with (acute) exacerbation: Secondary | ICD-10-CM | POA: Diagnosis not present

## 2019-12-25 ENCOUNTER — Encounter (HOSPITAL_COMMUNITY): Payer: Medicare HMO

## 2019-12-27 ENCOUNTER — Encounter (HOSPITAL_COMMUNITY): Payer: Medicare HMO

## 2020-01-01 ENCOUNTER — Encounter (HOSPITAL_COMMUNITY): Payer: Medicare HMO

## 2020-01-02 ENCOUNTER — Other Ambulatory Visit: Payer: Self-pay

## 2020-01-02 NOTE — Patient Outreach (Signed)
[facility]) Care Management  01/02/2020  Emily Rocha [birth year] [id number]   Telephone Assessment   Unsuccessful outreach attempt to patient.    Plan: RN CM will make quarterly outreach attempt to patient within the month of Feb  if no return call from patient.  Enzo Montgomery, RN,BSN,CCM [facility] Management Telephonic Care Management Coordinator Direct Phone: [phone] Toll Free: [phone] Fax: [phone]

## 2020-01-03 ENCOUNTER — Encounter (HOSPITAL_COMMUNITY): Payer: Medicare HMO

## 2020-01-04 DIAGNOSIS — J440 Chronic obstructive pulmonary disease with acute lower respiratory infection: Secondary | ICD-10-CM | POA: Diagnosis not present

## 2020-01-04 DIAGNOSIS — R059 Cough, unspecified: Secondary | ICD-10-CM | POA: Diagnosis not present

## 2020-01-04 DIAGNOSIS — Z299 Encounter for prophylactic measures, unspecified: Secondary | ICD-10-CM | POA: Diagnosis not present

## 2020-01-04 DIAGNOSIS — J209 Acute bronchitis, unspecified: Secondary | ICD-10-CM | POA: Diagnosis not present

## 2020-01-04 DIAGNOSIS — J439 Emphysema, unspecified: Secondary | ICD-10-CM | POA: Diagnosis not present

## 2020-01-04 DIAGNOSIS — U071 COVID-19: Secondary | ICD-10-CM | POA: Diagnosis not present

## 2020-01-04 DIAGNOSIS — J849 Interstitial pulmonary disease, unspecified: Secondary | ICD-10-CM | POA: Diagnosis not present

## 2020-01-04 DIAGNOSIS — I2510 Atherosclerotic heart disease of native coronary artery without angina pectoris: Secondary | ICD-10-CM | POA: Diagnosis not present

## 2020-01-07 ENCOUNTER — Ambulatory Visit: Payer: Self-pay

## 2020-01-08 ENCOUNTER — Encounter (HOSPITAL_COMMUNITY): Payer: Medicare HMO

## 2020-01-10 ENCOUNTER — Encounter (HOSPITAL_COMMUNITY): Payer: Medicare HMO

## 2020-01-15 ENCOUNTER — Encounter (HOSPITAL_COMMUNITY): Payer: Medicare HMO

## 2020-01-16 ENCOUNTER — Other Ambulatory Visit (HOSPITAL_COMMUNITY)
Admission: RE | Admit: 2020-01-16 | Discharge: 2020-01-16 | Disposition: A | Discharge status: Home / Self Care | Payer: Medicare HMO | Source: Ambulatory Visit | Attending: Student | Admitting: Student

## 2020-01-16 ENCOUNTER — Other Ambulatory Visit: Payer: Self-pay

## 2020-01-16 DIAGNOSIS — Z79899 Other long term (current) drug therapy: Secondary | ICD-10-CM | POA: Diagnosis not present

## 2020-01-16 LAB — BASIC METABOLIC PANEL
Anion gap: 8 (ref 5–15)
BUN: 11 mg/dL (ref 8–23)
CO2: 28 mmol/L (ref 22–32)
Calcium: 9.1 mg/dL (ref 8.9–10.3)
Chloride: 100 mmol/L (ref 98–111)
Creatinine, Ser: 0.47 mg/dL (ref 0.44–1.00)
GFR, Estimated: >60 mL/min (ref >60)
Glucose, Bld: 120 mg/dL — ABNORMAL HIGH (ref 70–99)
Potassium: 4.2 mmol/L (ref 3.5–5.1)
Sodium: 136 mmol/L (ref 135–145)

## 2020-01-16 LAB — CBC
HCT: 41.1 % (ref 36.0–46.0)
Hemoglobin: 11.7 g/dL — ABNORMAL LOW (ref 12.0–15.0)
MCH: 23.6 pg — ABNORMAL LOW (ref 26.0–34.0)
MCHC: 28.5 g/dL — ABNORMAL LOW (ref 30.0–36.0)
MCV: 82.9 fL (ref 80.0–100.0)
Platelets: 275 10*3/uL (ref 150–400)
RBC: 4.96 MIL/uL (ref 3.87–5.11)
RDW: 16.9 % — ABNORMAL HIGH (ref 11.5–15.5)
WBC: 4.9 10*3/uL (ref 4.0–10.5)
nRBC: 0 % (ref 0.0–0.2)

## 2020-01-16 LAB — MAGNESIUM: Magnesium: 2.1 mg/dL (ref 1.7–2.4)

## 2020-01-17 ENCOUNTER — Encounter (HOSPITAL_COMMUNITY): Payer: Medicare HMO

## 2020-01-21 ENCOUNTER — Ambulatory Visit: Payer: Medicare HMO | Admitting: Cardiology

## 2020-01-22 ENCOUNTER — Encounter (HOSPITAL_COMMUNITY): Payer: Medicare HMO

## 2020-01-24 ENCOUNTER — Encounter (HOSPITAL_COMMUNITY): Payer: Medicare HMO

## 2020-01-24 DIAGNOSIS — I2510 Atherosclerotic heart disease of native coronary artery without angina pectoris: Secondary | ICD-10-CM | POA: Diagnosis not present

## 2020-01-24 DIAGNOSIS — F419 Anxiety disorder, unspecified: Secondary | ICD-10-CM | POA: Diagnosis not present

## 2020-01-24 DIAGNOSIS — J439 Emphysema, unspecified: Secondary | ICD-10-CM | POA: Diagnosis not present

## 2020-01-24 DIAGNOSIS — E7800 Pure hypercholesterolemia, unspecified: Secondary | ICD-10-CM | POA: Diagnosis not present

## 2020-01-24 DIAGNOSIS — Z299 Encounter for prophylactic measures, unspecified: Secondary | ICD-10-CM | POA: Diagnosis not present

## 2020-01-25 ENCOUNTER — Ambulatory Visit: Payer: Medicare HMO | Admitting: Thoracic Surgery (Cardiothoracic Vascular Surgery)

## 2020-01-25 ENCOUNTER — Other Ambulatory Visit: Payer: Self-pay

## 2020-01-25 ENCOUNTER — Encounter: Payer: Self-pay | Admitting: Thoracic Surgery (Cardiothoracic Vascular Surgery)

## 2020-01-25 VITALS — BP 118/65 | HR 90 | Resp 20 | Ht 65.0 in | Wt 167.0 lb

## 2020-01-25 DIAGNOSIS — Z902 Acquired absence of lung [part of]: Secondary | ICD-10-CM | POA: Diagnosis not present

## 2020-01-25 DIAGNOSIS — Z85118 Personal history of other malignant neoplasm of bronchus and lung: Secondary | ICD-10-CM

## 2020-01-25 NOTE — Progress Notes (Signed)
     [address]       [place], [id number]             [phone]       Emily Rocha comes in for post hospital follow-up where she was admitted with an aspiration pneumonia likely secondary to her hiatal hernia.  I wanted to speak with her in regards to risk prevention for recurrence.  I do not think that she is a good surgical candidate given her moderate aortic stenosis, and significant cardiac history.  Additionally she is being followed by me for the lobectomy that was performed for her lung cancer.  She denies any reflux, but has been on Protonix for a number of years.  She does have a fear of throwing up again which she thinks led to her original aspiration.  We talked about several maneuvers to reduce her risk of reflux and aspiration she demonstrated good understanding of this.  Furthermore I explained the importance of following up with her cardiologist in regards to management of her moderate aortic stenosis.  She currently sees Dr. Carlyle Dolly, and is scheduled to meet with him in the next month.  I have also alerted the structural heart clinic about potential surveillance for her moderate aortic stenosis.  Given her significant cardiac history she definitely would be a candidate for TAVR in the future if her aortic stenosis continues to progress.  I am scheduled to see her again in 6 months for ongoing surveillance for her lung cancer.  [name]

## 2020-01-29 ENCOUNTER — Encounter (HOSPITAL_COMMUNITY): Payer: Medicare HMO

## 2020-01-31 ENCOUNTER — Encounter (HOSPITAL_COMMUNITY): Payer: Medicare HMO

## 2020-02-05 ENCOUNTER — Encounter (HOSPITAL_COMMUNITY): Payer: Medicare HMO

## 2020-02-06 ENCOUNTER — Other Ambulatory Visit: Payer: Self-pay | Admitting: Pulmonary Disease

## 2020-02-07 ENCOUNTER — Telehealth: Payer: Self-pay | Admitting: Pulmonary Disease

## 2020-02-07 MED ORDER — STIOLTO RESPIMAT 2.5-2.5 MCG/ACT IN AERS
2.0000 | INHALATION_SPRAY | Freq: Every day | RESPIRATORY_TRACT | 4 refills | Status: DC
Start: 2020-02-07 — End: 2022-04-06

## 2020-02-07 NOTE — Telephone Encounter (Signed)
Refill request sent to pharm.  Message left for patient  She needs an ov in next couple of months for follow up with DR. Mannam .

## 2020-02-14 DIAGNOSIS — H52209 Unspecified astigmatism, unspecified eye: Secondary | ICD-10-CM | POA: Diagnosis not present

## 2020-02-14 DIAGNOSIS — H524 Presbyopia: Secondary | ICD-10-CM | POA: Diagnosis not present

## 2020-02-14 DIAGNOSIS — H40033 Anatomical narrow angle, bilateral: Secondary | ICD-10-CM | POA: Diagnosis not present

## 2020-02-14 DIAGNOSIS — H2513 Age-related nuclear cataract, bilateral: Secondary | ICD-10-CM | POA: Diagnosis not present

## 2020-02-14 DIAGNOSIS — H43812 Vitreous degeneration, left eye: Secondary | ICD-10-CM | POA: Diagnosis not present

## 2020-02-14 DIAGNOSIS — H5310 Unspecified subjective visual disturbances: Secondary | ICD-10-CM | POA: Diagnosis not present

## 2020-02-14 DIAGNOSIS — H353132 Nonexudative age-related macular degeneration, bilateral, intermediate dry stage: Secondary | ICD-10-CM | POA: Diagnosis not present

## 2020-02-19 DIAGNOSIS — J209 Acute bronchitis, unspecified: Secondary | ICD-10-CM | POA: Diagnosis not present

## 2020-02-19 DIAGNOSIS — J440 Chronic obstructive pulmonary disease with acute lower respiratory infection: Secondary | ICD-10-CM | POA: Diagnosis not present

## 2020-02-19 DIAGNOSIS — F419 Anxiety disorder, unspecified: Secondary | ICD-10-CM | POA: Diagnosis not present

## 2020-02-19 DIAGNOSIS — Z299 Encounter for prophylactic measures, unspecified: Secondary | ICD-10-CM | POA: Diagnosis not present

## 2020-02-19 DIAGNOSIS — Z87891 Personal history of nicotine dependence: Secondary | ICD-10-CM | POA: Diagnosis not present

## 2020-02-19 DIAGNOSIS — Z6830 Body mass index (BMI) 30.0-30.9, adult: Secondary | ICD-10-CM | POA: Diagnosis not present

## 2020-02-19 DIAGNOSIS — J439 Emphysema, unspecified: Secondary | ICD-10-CM | POA: Diagnosis not present

## 2020-02-19 NOTE — Telephone Encounter (Signed)
lmtcb for pt to make an appt with Dr. Vaughan Browner.

## 2020-02-21 NOTE — Telephone Encounter (Signed)
Due to unsuccessful attempts to reach pt, message will be closed. Pt has been left a detailed message to call office back to schedule OV with Dr. Vaughan Browner.

## 2020-03-11 ENCOUNTER — Encounter: Payer: Self-pay | Admitting: Cardiology

## 2020-03-11 ENCOUNTER — Other Ambulatory Visit: Payer: Self-pay

## 2020-03-11 ENCOUNTER — Ambulatory Visit: Payer: Medicare HMO | Admitting: Cardiology

## 2020-03-11 VITALS — BP 116/78 | HR 92 | Ht 65.0 in | Wt 167.0 lb

## 2020-03-11 DIAGNOSIS — I350 Nonrheumatic aortic (valve) stenosis: Secondary | ICD-10-CM

## 2020-03-11 DIAGNOSIS — I2510 Atherosclerotic heart disease of native coronary artery without angina pectoris: Secondary | ICD-10-CM

## 2020-03-11 NOTE — Progress Notes (Signed)
Clinical Summary Ms. Heinemann is a 65 y.o.female seen today for follow up of the following medical problems.   1. CAD - s/p CABG (LIMA-LAD, SVG-LCX, SVG-Diag) after presenting 11/2016 with NSTEMI. Presentation complicated by cardiogenic shock - echo 12/2016 LVEF 45-50% - developed wound infection followed by CT surgery that has now healed.  - currentlty in cardiac rehab - plavix stoppedpreviouslyby CT surgery   11/2017 echo LVEF normal LVEF, grade I DDX, mild AS, mild AI, 10/2018 nuclear stress no ischemia    - no chest pain -chronic SOB she related to COPD.  - compliant with meds    2. Carotid stenosis - history of prior symptomatic carotid stenosis with TIAs.  - history of prior right CEA - found to have high grade stenosis right inonominate artery, she is s/p right subclavian and right common carotid bypass 12/2017 - followed by vascular,     3.Aortic stenosis -10/2019 echo mod AS - no recent symptoms    4. COPD - followed by pulmonary - on home O2  5. Lung cancer -s/p lung wedge resection - compleltely removed   6. Syncope She was again admitted to [facility] from 11/12/2019 - 11/13/2019 for evaluation of multiple syncopal episodes and she was found on the ground by EMS. Imaging during admission did show a subdural hematoma and Neurosurgery recommended discontinuing Eliquis and nonoperative management. A repeat echocardiogram was performed and showed a preserved EF of 60 to 65%. This was read as showing moderate to severe aortic stenosis and Dr. Harl Bowie felt it was more consistent with a moderate range and likely did not play a role in her syncopal episodes. She did have soft blood pressures during admission and was recommended to remain off Toprol-XL and Imdur until outpatient follow-up. Was also recommended to have an outpatient monitor so to assess for any significant arrhythmias.  - overall benign event monitor - was to stop eliquis and f/u  with neurosurg  - no receurrent symptoms.    7. 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 Anxiety   . Asthma   . COPD (chronic obstructive pulmonary disease) ([place])   . Coronary artery disease    hx CABG  . GERD (gastroesophageal reflux disease)   . Peripheral vascular disease ([place])   . Pneumonia   . Shortness of breath dyspnea   . Stroke [facility])    "mini stroke" numbness and weakness to left side of the body     No Known Allergies   Current Outpatient Medications  Medication Sig Dispense Refill  . albuterol (PROVENTIL) (2.5 MG/3ML) 0.083% nebulizer solution Take 2.5 mg by nebulization every 4 (four) hours as needed.    . ALPRAZolam (XANAX) 0.5 MG tablet Take 0.5 mg by mouth 3 (three) times daily.    . Ascorbic Acid (VITAMIN C GUMMIE) 120 MG CHEW Chew 500 mg by mouth.    Marland Kitchen aspirin EC 81 MG tablet Take 1 tablet (81 mg total) by mouth daily. Swallow whole. 90 tablet 3  . atorvastatin (LIPITOR) 40 MG tablet Take 1 tablet (40 mg total) daily by mouth. 30 tablet 1  . calcium carbonate (OS-CAL) 1250 (500 Ca) MG chewable tablet Chew 1 tablet by mouth daily.    . cholecalciferol (VITAMIN D3) 25 MCG (1000 UNIT) tablet Take 1,000 Units by mouth daily.    . diphenhydramine-acetaminophen (TYLENOL PM) 25-500 MG TABS tablet Take 2-3 tablets  by mouth at bedtime.    Marland Kitchen guaiFENesin (MUCINEX) 600 MG 12 hr tablet Take 1 tablet (600 mg total) by mouth 2 (two) times daily as needed. 60 tablet 1  . isosorbide mononitrate (IMDUR) 30 MG 24 hr tablet Take 0.5 tablets (15 mg total) by mouth daily. 45 tablet 3  . metoprolol succinate (TOPROL-XL) 50 MG 24 hr tablet Take 1 tablet (50 mg total) by mouth daily. Take with or immediately following a meal. 90 tablet 3  . Multiple Vitamin (MULTIVITAMIN WITH MINERALS) TABS tablet Take 1 tablet by mouth daily.    . pantoprazole (PROTONIX) 40 MG tablet Take 40 mg by mouth daily.    . potassium chloride SA  (KLOR-CON) 20 MEQ tablet Take 1 tablet (20 mEq total) by mouth every Monday, Wednesday, and Friday.  2  . Tiotropium Bromide-Olodaterol (STIOLTO RESPIMAT) 2.5-2.5 MCG/ACT AERS Inhale 2 puffs into the lungs daily. 1 each 4  . torsemide (DEMADEX) 20 MG tablet Take 1 tablet (20 mg total) by mouth every Monday, Wednesday, and Friday.     No current facility-administered medications for this visit.     Past Surgical History:  Procedure Laterality Date  . AORTIC ARCH ANGIOGRAPHY N/A 11/04/2017   Procedure: AORTIC ARCH ANGIOGRAPHY;  Surgeon: Elam Dutch, MD;  Location: [place] [facility];  Service: Cardiovascular;  Laterality: N/A;  . APPLICATION OF WOUND VAC N/A 01/06/2018   Procedure: APPLICATION OF WOUND VAC;  Surgeon: Ivin Poot, MD;  Location: [place];  Service: Thoracic;  Laterality: N/A;  . APPLICATION OF WOUND VAC N/A 01/13/2018   Procedure: WOUND VAC CHANGE;  Surgeon: Ivin Poot, MD;  Location: [place];  Service: Thoracic;  Laterality: N/A;  . CARDIAC CATHETERIZATION    . CAROTID-SUBCLAVIAN BYPASS GRAFT Right 12/26/2017   Procedure: AORTIC TO RIGHT COMMON CAROTID AND RIGHT SUBCLAVIAN  ARTERY  BYPASS;  Surgeon: Elam Dutch, MD;  Location: [place];  Service: Vascular;  Laterality: Right;  . CHOLECYSTECTOMY    . CLOSURE OF DIAPHRAGM  12/26/2017   Procedure: REPAIR OF DIAPHRAGM;  Surgeon: Ivin Poot, MD;  Location: [place];  Service: Thoracic;;  . COLONOSCOPY N/A 10/03/2014   Procedure: COLONOSCOPY;  Surgeon: Rogene Houston, MD;  [facility];  Service: Endoscopy;  Laterality: N/A;  730  . CORONARY ARTERY BYPASS GRAFT N/A 12/14/2016   Procedure: CORONARY ARTERY BYPASS GRAFTING (CABG) x three , using left internal mammary artery and right leg greater saphenous vein harvested endoscopically;  Surgeon: Ivin Poot, MD;  Location: [place];  Service: Open Heart Surgery;  Laterality: N/A;  . ENDARTERECTOMY Right 12/06/2014   Procedure: ENDARTERECTOMY CAROTid;   Surgeon: Elam Dutch, MD;  [facility];  Service: Vascular;  Laterality: Right;  . ESOPHAGOGASTRODUODENOSCOPY N/A 12/03/2016   Procedure: ESOPHAGOGASTRODUODENOSCOPY (EGD);  Surgeon: Rogene Houston, MD;  [facility];  Service: Endoscopy;  Laterality: N/A;  7:30  . LEFT HEART CATH AND CORONARY ANGIOGRAPHY N/A 12/14/2016   Procedure: LEFT HEART CATH AND CORONARY ANGIOGRAPHY;  Surgeon: Burnell Blanks, MD;  Location: [place] [facility];  Service: Cardiovascular;  Laterality: N/A;  . LOBECTOMY Right 10/26/2018   Procedure: RIGHT UPPER LOBECTOMY;  Surgeon: Lajuana Matte, MD;  Location: [place];  Service: Thoracic;  Laterality: Right;  . PERIPHERAL VASCULAR CATHETERIZATION N/A 12/05/2014   Procedure:  Carotid Arch Angiography;  Surgeon: Conrad Menominee, MD;  Location: [place] [facility];  Service: Cardiovascular;  Laterality: N/A;  .  STERNAL WOUND DEBRIDEMENT N/A 01/06/2018   Procedure: STERNAL WOUND DEBRIDEMENT;  Surgeon: Ivin Poot, MD;  Location: [place];  Service: Thoracic;  Laterality: N/A;  . STERNOTOMY N/A 12/26/2017   Procedure: REDO STERNOTOMY;  Surgeon: Prescott Gum, Collier Salina, MD;  Location: [place];  Service: Thoracic;  Laterality: N/A;  . TEE WITHOUT CARDIOVERSION N/A 12/14/2016   Procedure: TRANSESOPHAGEAL ECHOCARDIOGRAM (TEE);  Surgeon: Prescott Gum, Collier Salina, MD;  Location: [place];  Service: Open Heart Surgery;  Laterality: N/A;  . VIDEO ASSISTED THORACOSCOPY (VATS)/WEDGE RESECTION Right 10/26/2018   Procedure: VIDEO ASSISTED THORACOSCOPY (VATS)/WEDGE RESECTION;  Surgeon: Lajuana Matte, MD;  Location: [place];  Service: Thoracic;  Laterality: Right;  Marland Kitchen VIDEO BRONCHOSCOPY N/A 10/26/2018   Procedure: VIDEO BRONCHOSCOPY;  Surgeon: Lajuana Matte, MD;  [facility];  Service: Thoracic;  Laterality: N/A;     No Known Allergies    Family History  Problem Relation Age of Onset  . Heart attack Mother   . Cancer Mother   . Diabetes Mother   . Heart  disease Mother   . Heart attack Father   . Heart disease Father      Social History Ms. Boulier reports that she quit smoking about 4 years ago. She has a 80.00 pack-year smoking history. She has never used smokeless tobacco. Ms. Cantrelle reports no history of alcohol use.   Review of Systems CONSTITUTIONAL: No weight loss, fever, chills, weakness or fatigue.  HEENT: Eyes: No visual loss, blurred vision, double vision or yellow sclerae.No hearing loss, sneezing, congestion, runny nose or sore throat.  SKIN: No rash or itching.  CARDIOVASCULAR: per hpi RESPIRATORY: No shortness of breath, cough or sputum.  GASTROINTESTINAL: No anorexia, nausea, vomiting or diarrhea. No abdominal pain or blood.  GENITOURINARY: No burning on urination, no polyuria NEUROLOGICAL: No headache, dizziness, syncope, paralysis, ataxia, numbness or tingling in the extremities. No change in bowel or bladder control.  MUSCULOSKELETAL: No muscle, back pain, joint pain or stiffness.  LYMPHATICS: No enlarged nodes. No history of splenectomy.  PSYCHIATRIC: No history of depression or anxiety.  ENDOCRINOLOGIC: No reports of sweating, cold or heat intolerance. No polyuria or polydipsia.  Marland Kitchen   Physical Examination Today's Vitals   03/11/20 1344  BP: 116/78  Pulse: 92  SpO2: 94%  Weight: 167 lb (75.8 kg)  Height: 5\' 5"  (1.651 m)   Body mass index is 27.79 kg/m.  Gen: resting comfortably, no acute distress HEENT: no scleral icterus, pupils equal round and reactive, no palptable cervical adenopathy,  CV: RRR, 3/6 systolic murmur rusb Resp: Clear to auscultation bilaterally GI: abdomen is soft, non-tender, non-distended, normal bowel sounds, no hepatosplenomegaly MSK: extremities are warm, no edema.  Skin: warm, no rash Neuro:  no focal deficits Psych: appropriate affect   Diagnostic Studies  06/2015 echo Study Conclusions  - Left ventricle: The cavity size was normal. Wall thickness was  increased in a  pattern of mild concentric LVH, with moderate  focal basal septal hypertrophy. Systolic function was normal. The  estimated ejection fraction was in the range of 60% to 65%. Wall  motion was normal; there were no regional wall motion  abnormalities. Doppler parameters are consistent with abnormal  left ventricular relaxation (grade 1 diastolic dysfunction).  Indeterminate filling pressures. - Aortic valve: Mildly to moderately calcified annulus. Mildly  thickened, mildly calcified leaflets. There was mild stenosis.  Peak velocity (S): 231 cm/s. Mean gradient (S): 9 mm Hg. Valve  area (VTI): 1.69 cm^2. Valve area (Vmean): 1.69  cm^2. - Mitral valve: Mildly calcified annulus. Normal thickness leaflets  .  06/2015 Stress MPI  Equivocal ST segment depression, 0.5 mm in leads II, III, aVF, and V4 through V6. Resolved quickly in recovery. Intermediate risk Duke treadmill score of 1.5 based on limited exercise time and nondiagnostic ST segment change.  Blood pressure demonstrated a hypertensive response to exercise.  Small, mild intensity, basal septal defect that exhibits partial reversibility, suspect related to variable soft tissue attenuation rather than ischemia.  This is a low risk study based on perfusion imaging.  Nuclear stress EF: 75%.  11/2016 cath  Mid RCA lesion, 60 %stenosed.  Prox RCA lesion, 40 %stenosed.  Ost RPDA to RPDA lesion, 90 %stenosed.  Ost Cx to Prox Cx lesion, 100 %stenosed.  Mid LAD lesion, 80 %stenosed.  Ost 2nd Diag to 2nd Diag lesion, 40 %stenosed.  Prox LAD to Mid LAD lesion, 40 %stenosed.  Ost LM to LM lesion, 99 %stenosed.  1. Severe distal left main stenosis 2. Severe mid LAD stenosis 3. 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  4. Moderately severe, calcified stenosis of the mid RCA 5. Elevated filling pressures.  11/2016 echo  Mid RCA lesion, 60 %stenosed.  Prox  RCA lesion, 40 %stenosed.  Ost RPDA to RPDA lesion, 90 %stenosed.  Ost Cx to Prox Cx lesion, 100 %stenosed.  Mid LAD lesion, 80 %stenosed.  Ost 2nd Diag to 2nd Diag lesion, 40 %stenosed.  Prox LAD to Mid LAD lesion, 40 %stenosed.  Ost LM to LM lesion, 99 %stenosed.  1. Severe distal left main stenosis 2. Severe mid LAD stenosis 3. 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  4. Moderately severe, calcified stenosis of the mid RCA 5. Elevated filling pressures.  12/2016 echo Study Conclusions  - HPI and indications: Limited for LV function & Rule out effusion. - Left ventricle: Systolic function was mildly reduced. The estimated ejection fraction was in the range of 45% to 50%. - Aortic valve: Mildly calcified leaflets. Mild to moderate stensois. Mean gradient (S): 17 mm Hg. Peak gradient (S): 28 mm Hg. Valve area (VTI): 1.01 cm^2. Valve area (Vmax): 1.1 cm^2. Valve area (Vmean): 1.02 cm^2.  Impressions:  - Compared to the recent study on 12/12/16, the LVEF is unchaged at 45-50%. There is mild to moderate aortic stenosis. No pericardial effusion is noted.  11/2017 echo Study Conclusions   - Left ventricle: The cavity size was normal. There was mild  concentric hypertrophy. Systolic function was normal. Wall motion  was normal; there were no regional wall motion abnormalities.  Doppler parameters are consistent with abnormal left ventricular  relaxation (grade 1 diastolic dysfunction). Doppler parameters  are consistent with high ventricular filling pressure.  - Aortic valve: Valve mobility was restricted. There was mild  stenosis. There was mild regurgitation. Peak velocity (S): 288  cm/s. Mean gradient (S): 17 mm Hg. Valve area (VTI): 0.83 cm^2.  Valve area (Vmax): 0.85 cm^2. Valve area (Vmean): 0.87 cm^2.  - Mitral valve: Transvalvular velocity was within the normal range.   There was no evidence for stenosis. There was trivial  regurgitation.  - Right ventricle: The cavity size was normal. Wall thickness was  normal. Systolic function was normal.  - Atrial septum: No defect or patent foramen ovale was identified.  - Tricuspid valve: There was mild regurgitation.  - Pulmonary arteries: PA peak pressure: 27 mm Hg (S).  10/2018 nuclear stress  The left ventricular ejection fraction is normal (55-65%).  Nuclear stress EF: 56%.  There was no ST segment deviation noted during stress.  The study is normal.  This is a low risk study.    Assessment and Plan  1. CAD -no recent symptoms, continue to monitor    2. Aortic stenosis -will repeat echo in March        Arnoldo Lenis, M.D.

## 2020-03-11 NOTE — Patient Instructions (Signed)
Medication Instructions:  Your physician recommends that you continue on your current medications as directed. Please refer to the Current Medication list given to you today.   *If you need a refill on your cardiac medications before your next appointment, please call your pharmacy*   Lab Work: None today  If you have labs (blood work) drawn today and your tests are completely normal, you will receive your results only by: Marland Kitchen MyChart Message (if you have MyChart) OR . A paper copy in the mail If you have any lab test that is abnormal or we need to change your treatment, we will call you to review the results.   Testing/Procedures: Your physician has requested that you have an echocardiogram in March . Echocardiography is a painless test that uses sound waves to create images of your heart. It provides your doctor with information about the size and shape of your heart and how well your heart's chambers and valves are working. This procedure takes approximately one hour. There are no restrictions for this procedure.     Follow-Up: At [facility], you and your health needs are our priority.  As part of our continuing mission to provide you with exceptional heart care, we have created designated Provider Care Teams.  These Care Teams include your primary Cardiologist (physician) and Advanced Practice Providers (APPs -  Physician Assistants and Nurse Practitioners) who all work together to provide you with the care you need, when you need it.  We recommend signing up for the patient portal called "MyChart".  Sign up information is provided on this After Visit Summary.  MyChart is used to connect with patients for Virtual Visits (Telemedicine).  Patients are able to view lab/test results, encounter notes, upcoming appointments, etc.  Non-urgent messages can be sent to your provider as well.   To learn more about what you can do with MyChart, go to [email or web address].    Your next  appointment:   3 month(s)  The format for your next appointment:   In Person  Provider:   Carlyle Dolly, MD   Other Instructions None    Thank you for choosing [facility] !

## 2020-03-18 DIAGNOSIS — F1320 Sedative, hypnotic or anxiolytic dependence, uncomplicated: Secondary | ICD-10-CM | POA: Diagnosis not present

## 2020-03-18 DIAGNOSIS — J439 Emphysema, unspecified: Secondary | ICD-10-CM | POA: Diagnosis not present

## 2020-03-18 DIAGNOSIS — J441 Chronic obstructive pulmonary disease with (acute) exacerbation: Secondary | ICD-10-CM | POA: Diagnosis not present

## 2020-03-18 DIAGNOSIS — Z299 Encounter for prophylactic measures, unspecified: Secondary | ICD-10-CM | POA: Diagnosis not present

## 2020-03-18 DIAGNOSIS — I2510 Atherosclerotic heart disease of native coronary artery without angina pectoris: Secondary | ICD-10-CM | POA: Diagnosis not present

## 2020-03-25 ENCOUNTER — Other Ambulatory Visit: Payer: Self-pay

## 2020-03-25 NOTE — Patient Outreach (Addendum)
[facility]) Care Management  03/25/2020  Emily Rocha [birth year] [phone]   Telephone Assessment     Multiple attempts to establish contact with patient without success. No contact with patient in a year.     Plan: RN CM will close case at this time.   Enzo Montgomery, RN,BSN,CCM [facility] Telephonic Care Management Coordinator Direct Phone: [phone] Toll Free: [phone] Fax: [phone]

## 2020-03-26 ENCOUNTER — Ambulatory Visit: Payer: Self-pay

## 2020-04-16 DIAGNOSIS — I2699 Other pulmonary embolism without acute cor pulmonale: Secondary | ICD-10-CM | POA: Diagnosis not present

## 2020-04-16 DIAGNOSIS — F1320 Sedative, hypnotic or anxiolytic dependence, uncomplicated: Secondary | ICD-10-CM | POA: Diagnosis not present

## 2020-04-16 DIAGNOSIS — Z299 Encounter for prophylactic measures, unspecified: Secondary | ICD-10-CM | POA: Diagnosis not present

## 2020-04-16 DIAGNOSIS — F419 Anxiety disorder, unspecified: Secondary | ICD-10-CM | POA: Diagnosis not present

## 2020-04-16 DIAGNOSIS — J439 Emphysema, unspecified: Secondary | ICD-10-CM | POA: Diagnosis not present

## 2020-04-28 ENCOUNTER — Other Ambulatory Visit: Payer: Self-pay

## 2020-04-28 ENCOUNTER — Ambulatory Visit (HOSPITAL_COMMUNITY)
Admission: RE | Admit: 2020-04-28 | Discharge: 2020-04-28 | Disposition: A | Discharge status: Home / Self Care | Payer: Medicare HMO | Source: Ambulatory Visit | Attending: Cardiology | Admitting: Cardiology

## 2020-04-28 DIAGNOSIS — J439 Emphysema, unspecified: Secondary | ICD-10-CM | POA: Diagnosis not present

## 2020-04-28 DIAGNOSIS — I4891 Unspecified atrial fibrillation: Secondary | ICD-10-CM | POA: Diagnosis not present

## 2020-04-28 DIAGNOSIS — Z87891 Personal history of nicotine dependence: Secondary | ICD-10-CM | POA: Diagnosis not present

## 2020-04-28 DIAGNOSIS — Z299 Encounter for prophylactic measures, unspecified: Secondary | ICD-10-CM | POA: Diagnosis not present

## 2020-04-28 DIAGNOSIS — I350 Nonrheumatic aortic (valve) stenosis: Secondary | ICD-10-CM | POA: Insufficient documentation

## 2020-04-28 DIAGNOSIS — J441 Chronic obstructive pulmonary disease with (acute) exacerbation: Secondary | ICD-10-CM | POA: Diagnosis not present

## 2020-04-28 DIAGNOSIS — F419 Anxiety disorder, unspecified: Secondary | ICD-10-CM | POA: Diagnosis not present

## 2020-04-28 LAB — ECHOCARDIOGRAM COMPLETE
AR max vel: 0.58 cm2
AV Area VTI: 0.64 cm2
AV Area mean vel: 0.54 cm2
AV Mean grad: 43 mmHg
AV Peak grad: 68.2 mmHg
Ao pk vel: 4.13 m/s
Area-P 1/2: 3.46 cm2
S' Lateral: 2.74 cm

## 2020-04-28 NOTE — Progress Notes (Signed)
*  PRELIMINARY RESULTS* Echocardiogram 2D Echocardiogram has been performed.  Leavy Cella 04/28/2020, 1:41 PM

## 2020-04-29 ENCOUNTER — Telehealth: Payer: Self-pay | Admitting: *Deleted

## 2020-04-29 NOTE — Telephone Encounter (Signed)
-----   Message from Arnoldo Lenis, MD sent at 04/29/2020  8:03 AM EDT ----- Echo does show her aortic valve has become more stiff and now is in the severe range. Has she had any new symptoms of SOB or chest pain? If feeling well this is something for [place] to just continue to monitor closely, if any new symptoms may be the time to start talking about valve replacement.    Zandra Abts MD

## 2020-04-29 NOTE — Telephone Encounter (Signed)
Pt voiced understanding - does have increased SOB and labored breathing - pcp started her on prednisone and re started inhaler for COPD - denies chest pain or any other symptoms at this time - has f/u 4/26

## 2020-04-30 ENCOUNTER — Telehealth: Payer: Self-pay | Admitting: Cardiology

## 2020-04-30 NOTE — Telephone Encounter (Signed)
Have her keep APril appt. Update [place] in about 1 week on her breathing, hopefully it is mainly from COPD and a few days of prednisone will improve it   Zandra Abts MD

## 2020-04-30 NOTE — Telephone Encounter (Signed)
Left detailed message on cell per [facility]

## 2020-04-30 NOTE — Telephone Encounter (Signed)
Pt will call us back in 1 week with update on SOB per recent phone note (see echo results) and keep April f/u

## 2020-04-30 NOTE — Telephone Encounter (Signed)
Patient missed call from [facility] this morning , she has a few questions she would like to go over with [facility] , please call back

## 2020-05-05 ENCOUNTER — Telehealth: Payer: Self-pay | Admitting: Cardiology

## 2020-05-05 NOTE — Telephone Encounter (Signed)
Can I see her March 23 at [facility]rs[facility] Burnet MD

## 2020-05-05 NOTE — Telephone Encounter (Signed)
Pt giving update on symptoms - has been taking prednisone for 6 days and says breathing is not as labored but still bothersome - says she has 5-6 episodes of chest pain lasting a few minutes at a time - denies dizziness

## 2020-05-05 NOTE — Telephone Encounter (Signed)
Pt agreeable - will forward to scheduling to add pt

## 2020-05-05 NOTE — Telephone Encounter (Signed)
Patient called to give Staci an update. She said she is still having some pain but not since Thursday morning. She is concerned about her breathing because she had been on Prednisone for 6 days and still having a hard time breathing. She can be reached at [phone].

## 2020-05-07 ENCOUNTER — Other Ambulatory Visit: Payer: Self-pay

## 2020-05-07 ENCOUNTER — Encounter: Payer: Self-pay | Admitting: Cardiology

## 2020-05-07 ENCOUNTER — Ambulatory Visit: Payer: Medicare HMO | Admitting: Cardiology

## 2020-05-07 ENCOUNTER — Other Ambulatory Visit (HOSPITAL_COMMUNITY)
Admission: RE | Admit: 2020-05-07 | Discharge: 2020-05-07 | Disposition: A | Discharge status: Home / Self Care | Payer: Medicare HMO | Source: Ambulatory Visit | Attending: Cardiology | Admitting: Cardiology

## 2020-05-07 ENCOUNTER — Other Ambulatory Visit: Payer: Self-pay | Admitting: Cardiology

## 2020-05-07 VITALS — BP 100/60 | HR 93 | Ht 65.0 in | Wt 166.0 lb

## 2020-05-07 DIAGNOSIS — I350 Nonrheumatic aortic (valve) stenosis: Secondary | ICD-10-CM

## 2020-05-07 LAB — CBC
HCT: 40.2 % (ref 36.0–46.0)
Hemoglobin: 11.9 g/dL — ABNORMAL LOW (ref 12.0–15.0)
MCH: 23.2 pg — ABNORMAL LOW (ref 26.0–34.0)
MCHC: 29.6 g/dL — ABNORMAL LOW (ref 30.0–36.0)
MCV: 78.2 fL — ABNORMAL LOW (ref 80.0–100.0)
Platelets: 231 10*3/uL (ref 150–400)
RBC: 5.14 MIL/uL — ABNORMAL HIGH (ref 3.87–5.11)
RDW: 18.4 % — ABNORMAL HIGH (ref 11.5–15.5)
WBC: 7.7 10*3/uL (ref 4.0–10.5)
nRBC: 0 % (ref 0.0–0.2)

## 2020-05-07 LAB — BASIC METABOLIC PANEL
Anion gap: 11 (ref 5–15)
BUN: 15 mg/dL (ref 8–23)
CO2: 27 mmol/L (ref 22–32)
Calcium: 9.1 mg/dL (ref 8.9–10.3)
Chloride: 99 mmol/L (ref 98–111)
Creatinine, Ser: 0.92 mg/dL (ref 0.44–1.00)
GFR, Estimated: >60 mL/min (ref >60)
Glucose, Bld: 95 mg/dL (ref 70–99)
Potassium: 3.9 mmol/L (ref 3.5–5.1)
Sodium: 137 mmol/L (ref 135–145)

## 2020-05-07 MED ORDER — SODIUM CHLORIDE 0.9% FLUSH: 3.0000 mL | Freq: Two times a day (BID) | INTRAVENOUS | Status: DC

## 2020-05-07 NOTE — Patient Instructions (Signed)
Medication Instructions:  Your physician recommends that you continue on your current medications as directed. Please refer to the Current Medication list given to you today.  *If you need a refill on your cardiac medications before your next appointment, please call your pharmacy*   Lab Work: BMET CBC If you have labs (blood work) drawn today and your tests are completely normal, you will receive your results only by: Marland Kitchen MyChart Message (if you have MyChart) OR . A paper copy in the mail If you have any lab test that is abnormal or we need to change your treatment, we will call you to review the results.   Testing/Procedures: Your physician has requested that you have a cardiac catheterization. Cardiac catheterization is used to diagnose and/or treat various heart conditions. Doctors may recommend this procedure for a number of different reasons. The most common reason is to evaluate chest pain. Chest pain can be a symptom of coronary artery disease (CAD), and cardiac catheterization can show whether plaque is narrowing or blocking your heart's arteries. This procedure is also used to evaluate the valves, as well as measure the blood flow and oxygen levels in different parts of your heart. For further information please visit [email or web address]. Please follow instruction sheet, as given.     Follow-Up: At [facility], you and your health needs are our priority.  As part of our continuing mission to provide you with exceptional heart care, we have created designated Provider Care Teams.  These Care Teams include your primary Cardiologist (physician) and Advanced Practice Providers (APPs -  Physician Assistants and Nurse Practitioners) who all work together to provide you with the care you need, when you need it.  We recommend signing up for the patient portal called "MyChart".  Sign up information is provided on this After Visit Summary.  MyChart is used to connect with patients for  Virtual Visits (Telemedicine).  Patients are able to view lab/test results, encounter notes, upcoming appointments, etc.  Non-urgent messages can be sent to your provider as well.   To learn more about what you can do with MyChart, go to [email or web address].    Your next appointment:   3 month(s)  The format for your next appointment:   In Person  Provider:   Carlyle Dolly, MD   Other Instructions     [place] [address] Dept: [phone] Loc: [place]  05/07/2020  You are scheduled for a Cardiac Catheterization on Monday, March 28 with Dr. Sherren Mocha.  1. Please arrive at the [facility] (Main Entrance A) at [facility]: [address] [place], [place] [address] at 5:30 AM (This time is two hours before your procedure to ensure your preparation). Free valet parking service is available.   Special note: Every effort is made to have your procedure done on time. Please understand that emergencies sometimes delay scheduled procedures.  2. Diet: Do not eat solid foods after midnight.  The patient may have clear liquids until 5am upon the day of the procedure.  3. Labs: You will need to have blood drawn on Wednesday, March 23 at [place]. [address], [place]  Open: 7am - 6pm, Sat 8am - 12 noon   Phone: [phone]. You do not need to be fasting.  4. Medication instructions in preparation for your procedure:   Contrast Allergy: No  Stop taking, Tylenol Monday, March 28,, Torsemide (  Demadex) Monday, March 28,  On the morning of your procedure, take your Aspirin and any morning medicines NOT listed above.  You may use sips of water.  5. Plan for one night stay--bring personal belongings. 6. Bring a current list of your medications and current insurance cards. 7. You MUST have a responsible person to drive you home. 8.  Someone MUST be with you the first 24 hours after you arrive home or your discharge will be delayed. 9. Please wear clothes that are easy to get on and off and wear slip-on shoes.  Thank you for allowing [place] to care for you!   -- [facility]

## 2020-05-07 NOTE — H&P (View-Only) (Signed)
Clinical Summary Emily Rocha is a 65 y.o.female seen today for follow up of the following medical problems. This is a focused visit on recent DOE.   1. CAD - s/p CABG(LIMA-LAD, SVG-LCX, SVG-Diag)after presenting 11/2016 with NSTEMI. Presentation complicated by cardiogenic shock - echo 12/2016 LVEF 45-50% - developed wound infection followed by CT surgery that has now healed.  - currentlty in cardiac rehab - plavix stoppedpreviouslyby CT surgery   11/2017 echo LVEF normal LVEF, grade I DDX, mild AS, mild AI, 10/2018 nuclear stress no ischemia    2. Carotid stenosis - history of prior symptomatic carotid stenosis with TIAs. - history of prior right CEA - found to have high grade stenosis right inonominate artery, she is s/p right subclavian and right common carotid bypass 12/2017 - followed by vascular     4.Aortic stenosis - 04/2020 echo LVEF 55-60%, mean grade 43 AVA 0.95  VTI  DI 0.23 - significant progression from 10/2019 echo   - recent SOB, had been started on prednisone by pcp without improvement - ongoing DOE with exertion, just walking from parking lot - no coughing, no wheezing.  - some mild chest pains mid to left sided often at rest. Tightness, 8/10 in severity. Lasts about 1 minute. 5 episodes over the last month - completed course of prednsione, still not improved.   5. COPD - followed by pulmonary - on home O2    6. Lung cancer -s/p lung wedge resection - compleltely removed   7. Syncope She was again admitted to [facility] from 11/12/2019- 9/28/2021for evaluation of multiple syncopal episodes and she was found on the ground by EMS. Imaging during admission did show a subdural hematoma andNeurosurgery recommended discontinuing Eliquis and nonoperative management.Arepeat echocardiogram was performed and showed a preserved EF of 60 to 65%. This was read as showing moderate to severe aortic stenosis and Dr. Harl Bowie felt it was more  consistent with a moderate range and likely did not play a role in her syncopal episodes. She did have soft blood pressures during admission and was recommended to remain off Toprol-XLand Imdur until outpatient follow-up. Was also recommended to have an outpatient monitor so to assess for any significant arrhythmias.  - overall benign event monitor      Past Medical History:  Diagnosis Date  . Anxiety   . Asthma   . COPD (chronic obstructive pulmonary disease) ([place])   . Coronary artery disease    hx CABG  . GERD (gastroesophageal reflux disease)   . Peripheral vascular disease ([place])   . Pneumonia   . Shortness of breath dyspnea   . Stroke [facility])    "mini stroke" numbness and weakness to left side of the body     No Known Allergies   Current Outpatient Medications  Medication Sig Dispense Refill  . albuterol (PROVENTIL) (2.5 MG/3ML) 0.083% nebulizer solution Take 2.5 mg by nebulization every 4 (four) hours as needed.    . ALPRAZolam (XANAX) 0.5 MG tablet Take 0.5 mg by mouth 3 (three) times daily.    . Ascorbic Acid (VITAMIN C GUMMIE) 120 MG CHEW Chew 500 mg by mouth.    Marland Kitchen aspirin EC 81 MG tablet Take 1 tablet (81 mg total) by mouth daily. Swallow whole. 90 tablet 3  . atorvastatin (LIPITOR) 40 MG tablet Take 1 tablet (40 mg total) daily by mouth. 30 tablet 1  . calcium carbonate (OS-CAL) 1250 (500 Ca) MG chewable tablet Chew 1 tablet by mouth daily.    Marland Kitchen  cholecalciferol (VITAMIN D3) 25 MCG (1000 UNIT) tablet Take 1,000 Units by mouth daily.    . diphenhydramine-acetaminophen (TYLENOL PM) 25-500 MG TABS tablet Take 2-3 tablets by mouth at bedtime.    Marland Kitchen guaiFENesin (MUCINEX) 600 MG 12 hr tablet Take 1 tablet (600 mg total) by mouth 2 (two) times daily as needed. 60 tablet 1  . isosorbide mononitrate (IMDUR) 30 MG 24 hr tablet Take 0.5 tablets (15 mg total) by mouth daily. 45 tablet 3  . metoprolol succinate (TOPROL-XL) 50 MG 24 hr tablet Take 1 tablet (50 mg total) by mouth  daily. Take with or immediately following a meal. 90 tablet 3  . Multiple Vitamin (MULTIVITAMIN WITH MINERALS) TABS tablet Take 1 tablet by mouth daily.    . pantoprazole (PROTONIX) 40 MG tablet Take 40 mg by mouth daily.    . potassium chloride SA (KLOR-CON) 20 MEQ tablet Take 20 mEq by mouth daily.    . Tiotropium Bromide-Olodaterol (STIOLTO RESPIMAT) 2.5-2.5 MCG/ACT AERS Inhale 2 puffs into the lungs daily. 1 each 4  . torsemide (DEMADEX) 20 MG tablet Take 40 mg by mouth daily. Takes 40 mg daily if needed     No current facility-administered medications for this visit.     Past Surgical History:  Procedure Laterality Date  . AORTIC ARCH ANGIOGRAPHY N/A 11/04/2017   Procedure: AORTIC ARCH ANGIOGRAPHY;  Surgeon: Elam Dutch, MD;  Location: [place] [facility];  Service: Cardiovascular;  Laterality: N/A;  . APPLICATION OF WOUND VAC N/A 01/06/2018   Procedure: APPLICATION OF WOUND VAC;  Surgeon: Ivin Poot, MD;  Location: [place];  Service: Thoracic;  Laterality: N/A;  . APPLICATION OF WOUND VAC N/A 01/13/2018   Procedure: WOUND VAC CHANGE;  Surgeon: Ivin Poot, MD;  Location: [place];  Service: Thoracic;  Laterality: N/A;  . CARDIAC CATHETERIZATION    . CAROTID-SUBCLAVIAN BYPASS GRAFT Right 12/26/2017   Procedure: AORTIC TO RIGHT COMMON CAROTID AND RIGHT SUBCLAVIAN  ARTERY  BYPASS;  Surgeon: Elam Dutch, MD;  Location: [place];  Service: Vascular;  Laterality: Right;  . CHOLECYSTECTOMY    . CLOSURE OF DIAPHRAGM  12/26/2017   Procedure: REPAIR OF DIAPHRAGM;  Surgeon: Ivin Poot, MD;  Location: [place];  Service: Thoracic;;  . COLONOSCOPY N/A 10/03/2014   Procedure: COLONOSCOPY;  Surgeon: Rogene Houston, MD;  [facility];  Service: Endoscopy;  Laterality: N/A;  730  . CORONARY ARTERY BYPASS GRAFT N/A 12/14/2016   Procedure: CORONARY ARTERY BYPASS GRAFTING (CABG) x three , using left internal mammary artery and right leg greater saphenous vein harvested  endoscopically;  Surgeon: Ivin Poot, MD;  Location: [place];  Service: Open Heart Surgery;  Laterality: N/A;  . ENDARTERECTOMY Right 12/06/2014   Procedure: ENDARTERECTOMY CAROTid;  Surgeon: Elam Dutch, MD;  Location: [facility] OR;  Service: Vascular;  Laterality: Right;  . ESOPHAGOGASTRODUODENOSCOPY N/A 12/03/2016   Procedure: ESOPHAGOGASTRODUODENOSCOPY (EGD);  Surgeon: Rogene Houston, MD;  [facility];  Service: Endoscopy;  Laterality: N/A;  7:30  . LEFT HEART CATH AND CORONARY ANGIOGRAPHY N/A 12/14/2016   Procedure: LEFT HEART CATH AND CORONARY ANGIOGRAPHY;  Surgeon: Burnell Blanks, MD;  Location: [place] [facility];  Service: Cardiovascular;  Laterality: N/A;  . LOBECTOMY Right 10/26/2018   Procedure: RIGHT UPPER LOBECTOMY;  Surgeon: Lajuana Matte, MD;  Location: [place];  Service: Thoracic;  Laterality: Right;  . PERIPHERAL VASCULAR CATHETERIZATION N/A 12/05/2014   Procedure:  Carotid Arch Angiography;  Surgeon: Conrad Polvadera, MD;  Location: [place] [facility];  Service: Cardiovascular;  Laterality: N/A;  . STERNAL WOUND DEBRIDEMENT N/A 01/06/2018   Procedure: STERNAL WOUND DEBRIDEMENT;  Surgeon: Ivin Poot, MD;  Location: [place];  Service: Thoracic;  Laterality: N/A;  . STERNOTOMY N/A 12/26/2017   Procedure: REDO STERNOTOMY;  Surgeon: Prescott Gum, Collier Salina, MD;  Location: [place];  Service: Thoracic;  Laterality: N/A;  . TEE WITHOUT CARDIOVERSION N/A 12/14/2016   Procedure: TRANSESOPHAGEAL ECHOCARDIOGRAM (TEE);  Surgeon: Prescott Gum, Collier Salina, MD;  Location: [place];  Service: Open Heart Surgery;  Laterality: N/A;  . VIDEO ASSISTED THORACOSCOPY (VATS)/WEDGE RESECTION Right 10/26/2018   Procedure: VIDEO ASSISTED THORACOSCOPY (VATS)/WEDGE RESECTION;  Surgeon: Lajuana Matte, MD;  Location: [place];  Service: Thoracic;  Laterality: Right;  Marland Kitchen VIDEO BRONCHOSCOPY N/A 10/26/2018   Procedure: VIDEO BRONCHOSCOPY;  Surgeon: Lajuana Matte, MD;  Location: [facility];  Service:  Thoracic;  Laterality: N/A;     No Known Allergies    Family History  Problem Relation Age of Onset  . Heart attack Mother   . Cancer Mother   . Diabetes Mother   . Heart disease Mother   . Heart attack Father   . Heart disease Father      Social History Ms. Perkinson reports that she quit smoking about 4 years ago. She has a 80.00 pack-year smoking history. She has never used smokeless tobacco. Ms. Creamer reports no history of alcohol use.   Review of Systems CONSTITUTIONAL: No weight loss, fever, chills, weakness or fatigue.  HEENT: Eyes: No visual loss, blurred vision, double vision or yellow sclerae.No hearing loss, sneezing, congestion, runny nose or sore throat.  SKIN: No rash or itching.  CARDIOVASCULAR: per hpi RESPIRATORY: per hpi GASTROINTESTINAL: No anorexia, nausea, vomiting or diarrhea. No abdominal pain or blood.  GENITOURINARY: No burning on urination, no polyuria NEUROLOGICAL: No headache, dizziness, syncope, paralysis, ataxia, numbness or tingling in the extremities. No change in bowel or bladder control.  MUSCULOSKELETAL: No muscle, back pain, joint pain or stiffness.  LYMPHATICS: No enlarged nodes. No history of splenectomy.  PSYCHIATRIC: No history of depression or anxiety.  ENDOCRINOLOGIC: No reports of sweating, cold or heat intolerance. No polyuria or polydipsia.  Marland Kitchen   Physical Examination Today's Vitals   05/07/20 1503  BP: 100/60  Pulse: 93  SpO2: 94%  Weight: 166 lb (75.3 kg)  Height: 5\' 5"  (1.651 m)   Body mass index is 27.62 kg/m.  Gen: resting comfortably, no acute distress HEENT: no scleral icterus, pupils equal round and reactive, no palptable cervical adenopathy,  CV: RRR, 3/6 systolic murmur rusb, no jvd Resp: Clear to auscultation bilaterally GI: abdomen is soft, non-tender, non-distended, normal bowel sounds, no hepatosplenomegaly MSK: extremities are warm, no edema.  Skin: warm, no rash Neuro:  no focal deficits Psych:  appropriate affect   Diagnostic Studies  06/2015 echo Study Conclusions  - Left ventricle: The cavity size was normal. Wall thickness was  increased in a pattern of mild concentric LVH, with moderate  focal basal septal hypertrophy. Systolic function was normal. The  estimated ejection fraction was in the range of 60% to 65%. Wall  motion was normal; there were no regional wall motion  abnormalities. Doppler parameters are consistent with abnormal  left ventricular relaxation (grade 1 diastolic dysfunction).  Indeterminate filling pressures. - Aortic valve: Mildly to moderately calcified annulus. Mildly  thickened, mildly calcified leaflets. There was mild stenosis.  Peak velocity (S): 231 cm/s. Mean  gradient (S): 9 mm Hg. Valve  area (VTI): 1.69 cm^2. Valve area (Vmean): 1.69 cm^2. - Mitral valve: Mildly calcified annulus. Normal thickness leaflets  .  06/2015 Stress MPI  Equivocal ST segment depression, 0.5 mm in leads II, III, aVF, and V4 through V6. Resolved quickly in recovery. Intermediate risk Duke treadmill score of 1.5 based on limited exercise time and nondiagnostic ST segment change.  Blood pressure demonstrated a hypertensive response to exercise.  Small, mild intensity, basal septal defect that exhibits partial reversibility, suspect related to variable soft tissue attenuation rather than ischemia.  This is a low risk study based on perfusion imaging.  Nuclear stress EF: 75%.  11/2016 cath  Mid RCA lesion, 60 %stenosed.  Prox RCA lesion, 40 %stenosed.  Ost RPDA to RPDA lesion, 90 %stenosed.  Ost Cx to Prox Cx lesion, 100 %stenosed.  Mid LAD lesion, 80 %stenosed.  Ost 2nd Diag to 2nd Diag lesion, 40 %stenosed.  Prox LAD to Mid LAD lesion, 40 %stenosed.  Ost LM to LM lesion, 99 %stenosed.  1. Severe distal left main stenosis 2. Severe mid LAD stenosis 3. Chronic total occlusion of the ostial Circumflex artery which fills from right to  left collaterals.  4. Moderately severe, calcified stenosis of the mid RCA 5. Elevated filling pressures.  11/2016 echo  Mid RCA lesion, 60 %stenosed.  Prox RCA lesion, 40 %stenosed.  Ost RPDA to RPDA lesion, 90 %stenosed.  Ost Cx to Prox Cx lesion, 100 %stenosed.  Mid LAD lesion, 80 %stenosed.  Ost 2nd Diag to 2nd Diag lesion, 40 %stenosed.  Prox LAD to Mid LAD lesion, 40 %stenosed.  Ost LM to LM lesion, 99 %stenosed.  1. Severe distal left main stenosis 2. Severe mid LAD stenosis 3. 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  4. Moderately severe, calcified stenosis of the mid RCA 5. Elevated filling pressures.  12/2016 echo Study Conclusions  - HPI and indications: Limited for LV function & Rule out effusion. - Left ventricle: Systolic function was mildly reduced. The estimated ejection fraction was in the range of 45% to 50%. - Aortic valve: Mildly calcified leaflets. Mild to moderate stensois. Mean gradient (S): 17 mm Hg. Peak gradient (S): 28 mm Hg. Valve area (VTI): 1.01 cm^2. Valve area (Vmax): 1.1 cm^2. Valve area (Vmean): 1.02 cm^2.  Impressions:  - Compared to the recent study on 12/12/16, the LVEF is unchaged at 45-50%. There is mild to moderate aortic stenosis. No pericardial effusion is noted.  11/2017 echo Study Conclusions   - Left ventricle: The cavity size was normal. There was mild  concentric hypertrophy. Systolic function was normal. Wall motion  was normal; there were no regional wall motion abnormalities.  Doppler parameters are consistent with abnormal left ventricular  relaxation (grade 1 diastolic dysfunction). Doppler parameters  are consistent with high ventricular filling pressure.  - Aortic valve: Valve mobility was restricted. There was mild  stenosis. There was mild regurgitation. Peak velocity (S): 288  cm/s. Mean gradient (S): 17 mm Hg.  Valve area (VTI): 0.83 cm^2.  Valve area (Vmax): 0.85 cm^2. Valve area (Vmean): 0.87 cm^2.  - Mitral valve: Transvalvular velocity was within the normal range.  There was no evidence for stenosis. There was trivial  regurgitation.  - Right ventricle: The cavity size was normal. Wall thickness was  normal. Systolic function was normal.  - Atrial septum: No defect or patent foramen ovale was identified.  - Tricuspid valve: There was  mild regurgitation.  - Pulmonary arteries: PA peak pressure: 27 mm Hg (S).    10/2018 nuclear stress  The left ventricular ejection fraction is normal (55-65%).  Nuclear stress EF: 56%.  There was no ST segment deviation noted during stress.  The study is normal.  This is a low risk study.   04/2020 echo IMPRESSIONS    1. Basal inferior akinesis. Left ventricular ejection fraction, by  estimation, is 55 to 60%. The left ventricle has normal function. Left  ventricular diastolic parameters are indeterminate.  2. Right ventricular systolic function is low normal. The right  ventricular size is mildly enlarged.  3. Trivial mitral valve regurgitation.  4. AV is thickened, calcified with restricted motion. Peak and mean  gradients through the valve are 68 and 43 mm Hg respectively Dimensionless  index is 0.23 consistent with severe AS. Compared to echo from 9.19.21,  gradients are increased. The aortic  valve is tricuspid. Aortic valve regurgitation is not visualized.  5. The inferior vena cava is normal in size with greater than 50%  respiratory variability, suggesting right atrial pressure of 3 mmHg.   Assessment and Plan   1. Severe aortic stenosis - quickly progressing AS, mean gradient was just 13 in 2018. By most recent echo 04/2020 mean grad 56, AVA VTI 0.95  DI 0.23 - progressing DOE that appears to be related to her valve, did not improve with course of prednsione from pcp, not having coughing/wheezing suggesting ongoing COPD  exacerbation - will plan for LHC/RHC with Dr Angelena Form or Burt Knack, will need to f/u with valve clinic after.   Focused visit today, see above HPI for more details about her extensive medical history.     Arnoldo Lenis, M.D.

## 2020-05-07 NOTE — Progress Notes (Signed)
Clinical Summary Emily Rocha is a 65 y.o.female seen today for follow up of the following medical problems. This is a focused visit on recent DOE.   1. CAD - s/p CABG(LIMA-LAD, SVG-LCX, SVG-Diag)after presenting 11/2016 with NSTEMI. Presentation complicated by cardiogenic shock - echo 12/2016 LVEF 45-50% - developed wound infection followed by CT surgery that has now healed.  - currentlty in cardiac rehab - plavix stoppedpreviouslyby CT surgery   11/2017 echo LVEF normal LVEF, grade I DDX, mild AS, mild AI, 10/2018 nuclear stress no ischemia    2. Carotid stenosis - history of prior symptomatic carotid stenosis with TIAs. - history of prior right CEA - found to have high grade stenosis right inonominate artery, she is s/p right subclavian and right common carotid bypass 12/2017 - followed by vascular     4.Aortic stenosis - 04/2020 echo LVEF 55-60%, mean grade 43 AVA 0.95  VTI  DI 0.23 - significant progression from 10/2019 echo   - recent SOB, had been started on prednisone by pcp without improvement - ongoing DOE with exertion, just walking from parking lot - no coughing, no wheezing.  - some mild chest pains mid to left sided often at rest. Tightness, 8/10 in severity. Lasts about 1 minute. 5 episodes over the last month - completed course of prednsione, still not improved.   5. COPD - followed by pulmonary - on home O2    6. Lung cancer -s/p lung wedge resection - compleltely removed   7. Syncope She was again admitted to [facility] from 11/12/2019- 9/28/2021for evaluation of multiple syncopal episodes and she was found on the ground by EMS. Imaging during admission did show a subdural hematoma andNeurosurgery recommended discontinuing Eliquis and nonoperative management.Arepeat echocardiogram was performed and showed a preserved EF of 60 to 65%. This was read as showing moderate to severe aortic stenosis and Dr. Harl Bowie felt it was more  consistent with a moderate range and likely did not play a role in her syncopal episodes. She did have soft blood pressures during admission and was recommended to remain off Toprol-XLand Imdur until outpatient follow-up. Was also recommended to have an outpatient monitor so to assess for any significant arrhythmias.  - overall benign event monitor      Past Medical History:  Diagnosis Date  . Anxiety   . Asthma   . COPD (chronic obstructive pulmonary disease) ([place])   . Coronary artery disease    hx CABG  . GERD (gastroesophageal reflux disease)   . Peripheral vascular disease ([place])   . Pneumonia   . Shortness of breath dyspnea   . Stroke [facility])    "mini stroke" numbness and weakness to left side of the body     No Known Allergies   Current Outpatient Medications  Medication Sig Dispense Refill  . albuterol (PROVENTIL) (2.5 MG/3ML) 0.083% nebulizer solution Take 2.5 mg by nebulization every 4 (four) hours as needed.    . ALPRAZolam (XANAX) 0.5 MG tablet Take 0.5 mg by mouth 3 (three) times daily.    . Ascorbic Acid (VITAMIN C GUMMIE) 120 MG CHEW Chew 500 mg by mouth.    Marland Kitchen aspirin EC 81 MG tablet Take 1 tablet (81 mg total) by mouth daily. Swallow whole. 90 tablet 3  . atorvastatin (LIPITOR) 40 MG tablet Take 1 tablet (40 mg total) daily by mouth. 30 tablet 1  . calcium carbonate (OS-CAL) 1250 (500 Ca) MG chewable tablet Chew 1 tablet by mouth daily.    Marland Kitchen  cholecalciferol (VITAMIN D3) 25 MCG (1000 UNIT) tablet Take 1,000 Units by mouth daily.    . diphenhydramine-acetaminophen (TYLENOL PM) 25-500 MG TABS tablet Take 2-3 tablets by mouth at bedtime.    Marland Kitchen guaiFENesin (MUCINEX) 600 MG 12 hr tablet Take 1 tablet (600 mg total) by mouth 2 (two) times daily as needed. 60 tablet 1  . isosorbide mononitrate (IMDUR) 30 MG 24 hr tablet Take 0.5 tablets (15 mg total) by mouth daily. 45 tablet 3  . metoprolol succinate (TOPROL-XL) 50 MG 24 hr tablet Take 1 tablet (50 mg total) by mouth  daily. Take with or immediately following a meal. 90 tablet 3  . Multiple Vitamin (MULTIVITAMIN WITH MINERALS) TABS tablet Take 1 tablet by mouth daily.    . pantoprazole (PROTONIX) 40 MG tablet Take 40 mg by mouth daily.    . potassium chloride SA (KLOR-CON) 20 MEQ tablet Take 20 mEq by mouth daily.    . Tiotropium Bromide-Olodaterol (STIOLTO RESPIMAT) 2.5-2.5 MCG/ACT AERS Inhale 2 puffs into the lungs daily. 1 each 4  . torsemide (DEMADEX) 20 MG tablet Take 40 mg by mouth daily. Takes 40 mg daily if needed     No current facility-administered medications for this visit.     Past Surgical History:  Procedure Laterality Date  . AORTIC ARCH ANGIOGRAPHY N/A 11/04/2017   Procedure: AORTIC ARCH ANGIOGRAPHY;  Surgeon: Elam Dutch, MD;  Location: [place] [facility];  Service: Cardiovascular;  Laterality: N/A;  . APPLICATION OF WOUND VAC N/A 01/06/2018   Procedure: APPLICATION OF WOUND VAC;  Surgeon: Ivin Poot, MD;  Location: [place];  Service: Thoracic;  Laterality: N/A;  . APPLICATION OF WOUND VAC N/A 01/13/2018   Procedure: WOUND VAC CHANGE;  Surgeon: Ivin Poot, MD;  Location: [place];  Service: Thoracic;  Laterality: N/A;  . CARDIAC CATHETERIZATION    . CAROTID-SUBCLAVIAN BYPASS GRAFT Right 12/26/2017   Procedure: AORTIC TO RIGHT COMMON CAROTID AND RIGHT SUBCLAVIAN  ARTERY  BYPASS;  Surgeon: Elam Dutch, MD;  Location: [place];  Service: Vascular;  Laterality: Right;  . CHOLECYSTECTOMY    . CLOSURE OF DIAPHRAGM  12/26/2017   Procedure: REPAIR OF DIAPHRAGM;  Surgeon: Ivin Poot, MD;  Location: [place];  Service: Thoracic;;  . COLONOSCOPY N/A 10/03/2014   Procedure: COLONOSCOPY;  Surgeon: Rogene Houston, MD;  [facility];  Service: Endoscopy;  Laterality: N/A;  730  . CORONARY ARTERY BYPASS GRAFT N/A 12/14/2016   Procedure: CORONARY ARTERY BYPASS GRAFTING (CABG) x three , using left internal mammary artery and right leg greater saphenous vein harvested  endoscopically;  Surgeon: Ivin Poot, MD;  Location: [place];  Service: Open Heart Surgery;  Laterality: N/A;  . ENDARTERECTOMY Right 12/06/2014   Procedure: ENDARTERECTOMY CAROTid;  Surgeon: Elam Dutch, MD;  Location: [facility];  Service: Vascular;  Laterality: Right;  . ESOPHAGOGASTRODUODENOSCOPY N/A 12/03/2016   Procedure: ESOPHAGOGASTRODUODENOSCOPY (EGD);  Surgeon: Rogene Houston, MD;  [facility];  Service: Endoscopy;  Laterality: N/A;  7:30  . LEFT HEART CATH AND CORONARY ANGIOGRAPHY N/A 12/14/2016   Procedure: LEFT HEART CATH AND CORONARY ANGIOGRAPHY;  Surgeon: Burnell Blanks, MD;  Location: [place] [facility];  Service: Cardiovascular;  Laterality: N/A;  . LOBECTOMY Right 10/26/2018   Procedure: RIGHT UPPER LOBECTOMY;  Surgeon: Lajuana Matte, MD;  Location: [place];  Service: Thoracic;  Laterality: Right;  . PERIPHERAL VASCULAR CATHETERIZATION N/A 12/05/2014   Procedure:  Carotid Arch Angiography;  Surgeon: Conrad Berkey, MD;  Location: [place] [facility];  Service: Cardiovascular;  Laterality: N/A;  . STERNAL WOUND DEBRIDEMENT N/A 01/06/2018   Procedure: STERNAL WOUND DEBRIDEMENT;  Surgeon: Ivin Poot, MD;  Location: [place];  Service: Thoracic;  Laterality: N/A;  . STERNOTOMY N/A 12/26/2017   Procedure: REDO STERNOTOMY;  Surgeon: Prescott Gum, Collier Salina, MD;  Location: [place];  Service: Thoracic;  Laterality: N/A;  . TEE WITHOUT CARDIOVERSION N/A 12/14/2016   Procedure: TRANSESOPHAGEAL ECHOCARDIOGRAM (TEE);  Surgeon: Prescott Gum, Collier Salina, MD;  Location: [place];  Service: Open Heart Surgery;  Laterality: N/A;  . VIDEO ASSISTED THORACOSCOPY (VATS)/WEDGE RESECTION Right 10/26/2018   Procedure: VIDEO ASSISTED THORACOSCOPY (VATS)/WEDGE RESECTION;  Surgeon: Lajuana Matte, MD;  Location: [place];  Service: Thoracic;  Laterality: Right;  Marland Kitchen VIDEO BRONCHOSCOPY N/A 10/26/2018   Procedure: VIDEO BRONCHOSCOPY;  Surgeon: Lajuana Matte, MD;  Location: [facility];  Service:  Thoracic;  Laterality: N/A;     No Known Allergies    Family History  Problem Relation Age of Onset  . Heart attack Mother   . Cancer Mother   . Diabetes Mother   . Heart disease Mother   . Heart attack Father   . Heart disease Father      Social History Ms. Insalaco reports that she quit smoking about 4 years ago. She has a 80.00 pack-year smoking history. She has never used smokeless tobacco. Ms. Palacios reports no history of alcohol use.   Review of Systems CONSTITUTIONAL: No weight loss, fever, chills, weakness or fatigue.  HEENT: Eyes: No visual loss, blurred vision, double vision or yellow sclerae.No hearing loss, sneezing, congestion, runny nose or sore throat.  SKIN: No rash or itching.  CARDIOVASCULAR: per hpi RESPIRATORY: per hpi GASTROINTESTINAL: No anorexia, nausea, vomiting or diarrhea. No abdominal pain or blood.  GENITOURINARY: No burning on urination, no polyuria NEUROLOGICAL: No headache, dizziness, syncope, paralysis, ataxia, numbness or tingling in the extremities. No change in bowel or bladder control.  MUSCULOSKELETAL: No muscle, back pain, joint pain or stiffness.  LYMPHATICS: No enlarged nodes. No history of splenectomy.  PSYCHIATRIC: No history of depression or anxiety.  ENDOCRINOLOGIC: No reports of sweating, cold or heat intolerance. No polyuria or polydipsia.  Marland Kitchen   Physical Examination Today's Vitals   05/07/20 1503  BP: 100/60  Pulse: 93  SpO2: 94%  Weight: 166 lb (75.3 kg)  Height: 5\' 5"  (1.651 m)   Body mass index is 27.62 kg/m.  Gen: resting comfortably, no acute distress HEENT: no scleral icterus, pupils equal round and reactive, no palptable cervical adenopathy,  CV: RRR, 3/6 systolic murmur rusb, no jvd Resp: Clear to auscultation bilaterally GI: abdomen is soft, non-tender, non-distended, normal bowel sounds, no hepatosplenomegaly MSK: extremities are warm, no edema.  Skin: warm, no rash Neuro:  no focal deficits Psych:  appropriate affect   Diagnostic Studies  06/2015 echo Study Conclusions  - Left ventricle: The cavity size was normal. Wall thickness was  increased in a pattern of mild concentric LVH, with moderate  focal basal septal hypertrophy. Systolic function was normal. The  estimated ejection fraction was in the range of 60% to 65%. Wall  motion was normal; there were no regional wall motion  abnormalities. Doppler parameters are consistent with abnormal  left ventricular relaxation (grade 1 diastolic dysfunction).  Indeterminate filling pressures. - Aortic valve: Mildly to moderately calcified annulus. Mildly  thickened, mildly calcified leaflets. There was mild stenosis.  Peak velocity (S): 231 cm/s. Mean  gradient (S): 9 mm Hg. Valve  area (VTI): 1.69 cm^2. Valve area (Vmean): 1.69 cm^2. - Mitral valve: Mildly calcified annulus. Normal thickness leaflets  .  06/2015 Stress MPI  Equivocal ST segment depression, 0.5 mm in leads II, III, aVF, and V4 through V6. Resolved quickly in recovery. Intermediate risk Duke treadmill score of 1.5 based on limited exercise time and nondiagnostic ST segment change.  Blood pressure demonstrated a hypertensive response to exercise.  Small, mild intensity, basal septal defect that exhibits partial reversibility, suspect related to variable soft tissue attenuation rather than ischemia.  This is a low risk study based on perfusion imaging.  Nuclear stress EF: 75%.  11/2016 cath  Mid RCA lesion, 60 %stenosed.  Prox RCA lesion, 40 %stenosed.  Ost RPDA to RPDA lesion, 90 %stenosed.  Ost Cx to Prox Cx lesion, 100 %stenosed.  Mid LAD lesion, 80 %stenosed.  Ost 2nd Diag to 2nd Diag lesion, 40 %stenosed.  Prox LAD to Mid LAD lesion, 40 %stenosed.  Ost LM to LM lesion, 99 %stenosed.  1. Severe distal left main stenosis 2. Severe mid LAD stenosis 3. Chronic total occlusion of the ostial Circumflex artery which fills from right to  left collaterals.  4. Moderately severe, calcified stenosis of the mid RCA 5. Elevated filling pressures.  11/2016 echo  Mid RCA lesion, 60 %stenosed.  Prox RCA lesion, 40 %stenosed.  Ost RPDA to RPDA lesion, 90 %stenosed.  Ost Cx to Prox Cx lesion, 100 %stenosed.  Mid LAD lesion, 80 %stenosed.  Ost 2nd Diag to 2nd Diag lesion, 40 %stenosed.  Prox LAD to Mid LAD lesion, 40 %stenosed.  Ost LM to LM lesion, 99 %stenosed.  1. Severe distal left main stenosis 2. Severe mid LAD stenosis 3. 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  4. Moderately severe, calcified stenosis of the mid RCA 5. Elevated filling pressures.  12/2016 echo Study Conclusions  - HPI and indications: Limited for LV function & Rule out effusion. - Left ventricle: Systolic function was mildly reduced. The estimated ejection fraction was in the range of 45% to 50%. - Aortic valve: Mildly calcified leaflets. Mild to moderate stensois. Mean gradient (S): 17 mm Hg. Peak gradient (S): 28 mm Hg. Valve area (VTI): 1.01 cm^2. Valve area (Vmax): 1.1 cm^2. Valve area (Vmean): 1.02 cm^2.  Impressions:  - Compared to the recent study on 12/12/16, the LVEF is unchaged at 45-50%. There is mild to moderate aortic stenosis. No pericardial effusion is noted.  11/2017 echo Study Conclusions   - Left ventricle: The cavity size was normal. There was mild  concentric hypertrophy. Systolic function was normal. Wall motion  was normal; there were no regional wall motion abnormalities.  Doppler parameters are consistent with abnormal left ventricular  relaxation (grade 1 diastolic dysfunction). Doppler parameters  are consistent with high ventricular filling pressure.  - Aortic valve: Valve mobility was restricted. There was mild  stenosis. There was mild regurgitation. Peak velocity (S): 288  cm/s. Mean gradient (S): 17 mm Hg.  Valve area (VTI): 0.83 cm^2.  Valve area (Vmax): 0.85 cm^2. Valve area (Vmean): 0.87 cm^2.  - Mitral valve: Transvalvular velocity was within the normal range.  There was no evidence for stenosis. There was trivial  regurgitation.  - Right ventricle: The cavity size was normal. Wall thickness was  normal. Systolic function was normal.  - Atrial septum: No defect or patent foramen ovale was identified.  - Tricuspid valve: There was  mild regurgitation.  - Pulmonary arteries: PA peak pressure: 27 mm Hg (S).    10/2018 nuclear stress  The left ventricular ejection fraction is normal (55-65%).  Nuclear stress EF: 56%.  There was no ST segment deviation noted during stress.  The study is normal.  This is a low risk study.   04/2020 echo IMPRESSIONS    1. Basal inferior akinesis. Left ventricular ejection fraction, by  estimation, is 55 to 60%. The left ventricle has normal function. Left  ventricular diastolic parameters are indeterminate.  2. Right ventricular systolic function is low normal. The right  ventricular size is mildly enlarged.  3. Trivial mitral valve regurgitation.  4. AV is thickened, calcified with restricted motion. Peak and mean  gradients through the valve are 68 and 43 mm Hg respectively Dimensionless  index is 0.23 consistent with severe AS. Compared to echo from 9.19.21,  gradients are increased. The aortic  valve is tricuspid. Aortic valve regurgitation is not visualized.  5. The inferior vena cava is normal in size with greater than 50%  respiratory variability, suggesting right atrial pressure of 3 mmHg.   Assessment and Plan   1. Severe aortic stenosis - quickly progressing AS, mean gradient was just 13 in 2018. By most recent echo 04/2020 mean grad 56, AVA VTI 0.95  DI 0.23 - progressing DOE that appears to be related to her valve, did not improve with course of prednsione from pcp, not having coughing/wheezing suggesting ongoing COPD  exacerbation - will plan for LHC/RHC with Dr Angelena Form or Burt Knack, will need to f/u with valve clinic after.   Focused visit today, see above HPI for more details about her extensive medical history.     Arnoldo Lenis, M.D.

## 2020-05-09 ENCOUNTER — Other Ambulatory Visit (HOSPITAL_COMMUNITY): Payer: Medicare HMO

## 2020-05-15 ENCOUNTER — Telehealth: Payer: Self-pay | Admitting: *Deleted

## 2020-05-15 NOTE — Telephone Encounter (Signed)
Pt contacted pre-catheterization scheduled at [facility] for: Monday May 19, 2020 7:30 AM Verified arrival time and place: [place] [facility]) at: 5:30 AM   No solid food after midnight prior to cath, clear liquids until 5 AM day of procedure.  Hold: Torsemide/KCL-AM of procedure  Except hold medications AM meds can be  taken pre-cath with sips of water including: ASA 81 mg   Confirmed patient has responsible adult to drive home post procedure and be with patient first 24 hours after arriving home: yes  You are allowed ONE visitor in the waiting room during the time you are at the hospital for your procedure. Both you and your visitor must wear a mask once you enter the hospital.    Reviewed procedure/mask/visitor instructions with patient.

## 2020-05-16 ENCOUNTER — Other Ambulatory Visit (HOSPITAL_COMMUNITY)
Admission: RE | Admit: 2020-05-16 | Discharge: 2020-05-16 | Disposition: A | Discharge status: Home / Self Care | Payer: Medicare HMO | Source: Ambulatory Visit | Attending: Cardiovascular Disease | Admitting: Cardiovascular Disease

## 2020-05-19 ENCOUNTER — Encounter (HOSPITAL_COMMUNITY)
Admission: AD | Disposition: A | Discharge status: Home / Self Care | Payer: Self-pay | Source: Home / Self Care | Attending: Cardiovascular Disease

## 2020-05-19 ENCOUNTER — Telehealth: Payer: Self-pay | Admitting: Cardiology

## 2020-05-19 ENCOUNTER — Telehealth: Payer: Self-pay

## 2020-05-19 ENCOUNTER — Other Ambulatory Visit: Payer: Self-pay

## 2020-05-19 ENCOUNTER — Encounter (HOSPITAL_COMMUNITY): Payer: Self-pay | Admitting: Cardiovascular Disease

## 2020-05-19 ENCOUNTER — Ambulatory Visit (HOSPITAL_COMMUNITY)
Admission: AD | Admit: 2020-05-19 | Discharge: 2020-05-19 | Disposition: A | Discharge status: Home / Self Care | Payer: Medicare HMO | Attending: Cardiovascular Disease | Admitting: Cardiovascular Disease

## 2020-05-19 DIAGNOSIS — U071 COVID-19: Secondary | ICD-10-CM | POA: Diagnosis not present

## 2020-05-19 DIAGNOSIS — J449 Chronic obstructive pulmonary disease, unspecified: Secondary | ICD-10-CM | POA: Diagnosis not present

## 2020-05-19 DIAGNOSIS — Z539 Procedure and treatment not carried out, unspecified reason: Secondary | ICD-10-CM | POA: Insufficient documentation

## 2020-05-19 DIAGNOSIS — I350 Nonrheumatic aortic (valve) stenosis: Secondary | ICD-10-CM

## 2020-05-19 DIAGNOSIS — Z299 Encounter for prophylactic measures, unspecified: Secondary | ICD-10-CM | POA: Diagnosis not present

## 2020-05-19 LAB — SARS CORONAVIRUS 2 BY RT PCR (HOSPITAL ORDER, PERFORMED IN ~~LOC~~ HOSPITAL LAB): SARS Coronavirus 2: POSITIVE — AB

## 2020-05-19 SURGERY — RIGHT/LEFT HEART CATH AND CORONARY/GRAFT ANGIOGRAPHY
Anesthesia: LOCAL

## 2020-05-19 MED ORDER — SODIUM CHLORIDE 0.9 % IV SOLN: 250.0000 mL | INTRAVENOUS | Status: DC | PRN

## 2020-05-19 MED ORDER — SODIUM CHLORIDE 0.9 % WEIGHT BASED INFUSION: 1.0000 mL/kg/h | INTRAVENOUS | Status: DC

## 2020-05-19 MED ORDER — LIDOCAINE HCL (PF) 1 % IJ SOLN
INTRAMUSCULAR | Status: AC
  Filled 2020-05-19: qty 30

## 2020-05-19 MED ORDER — SODIUM CHLORIDE 0.9% FLUSH: 3.0000 mL | INTRAVENOUS | Status: DC | PRN

## 2020-05-19 MED ORDER — ASPIRIN 81 MG PO CHEW: 81.0000 mg | CHEWABLE_TABLET | ORAL | Status: DC

## 2020-05-19 MED ORDER — SODIUM CHLORIDE 0.9 % WEIGHT BASED INFUSION
3.0000 mL/kg/h | INTRAVENOUS | Status: AC
  Administered 2020-05-19: 3 mL/kg/h via INTRAVENOUS

## 2020-05-19 MED ORDER — HEPARIN SODIUM (PORCINE) 1000 UNIT/ML IJ SOLN
INTRAMUSCULAR | Status: AC
  Filled 2020-05-19: qty 1

## 2020-05-19 MED ORDER — HEPARIN (PORCINE) IN NACL 1000-0.9 UT/500ML-% IV SOLN
INTRAVENOUS | Status: AC
  Filled 2020-05-19: qty 1000

## 2020-05-19 MED ORDER — VERAPAMIL HCL 2.5 MG/ML IV SOLN
INTRAVENOUS | Status: AC
  Filled 2020-05-19: qty 2

## 2020-05-19 NOTE — Telephone Encounter (Signed)
The patient tested positive for Covid when she came in for her catheterization this morning. Will call the patient tomorrow to reschedule cath 10 days out.

## 2020-05-19 NOTE — H&P (Signed)
Patient presented today for outpatient right and left heart catheterization to evaluate severe aortic stenosis.  Her COVID-19 test resulted as a positive.  Her procedure is canceled.  I spoke to the patient in person and she reports no symptoms and specifically denies any fever, chills, fatigue, or malaise.  She admits to a chronic cough but this is unchanged.  Because of her comorbid medical conditions with COPD and severe aortic stenosis, we reached out to her primary care physician who is seeing her later today for further assessment.  Communicated the plan with her husband as well.  We will reschedule her cardiac catheterization and other diagnostic studies as part of a TAVR evaluation.  Sherren Mocha 05/19/2020 4:51 PM

## 2020-05-19 NOTE — Telephone Encounter (Signed)
Error

## 2020-05-19 NOTE — Telephone Encounter (Signed)
Contacted patient who stated that her covid test never came back from [place], so she had to have a covid test at [facility] for cath that came back positive. Patient is showing no signs or symptoms of covid, so the patient home tested after leaving [facility], which then came up negative. I informed patient to be on the safe side, she should still quarantine for the CDC's recommended time frame. Patient verbalized understanding. Patient should be expecting a phone call on 05/20/20 to r/s cath 10 days out.

## 2020-05-19 NOTE — Telephone Encounter (Signed)
Patient called in regards to a Cath that she was supposed to have had this morning.

## 2020-05-20 ENCOUNTER — Telehealth: Payer: Self-pay

## 2020-05-20 NOTE — Telephone Encounter (Signed)
Called to discuss with patient about COVID-19 symptoms and the use of one of the available treatments for those with mild to moderate Covid symptoms and at a high risk of hospitalization.  Pt appears to qualify for outpatient treatment due to co-morbid conditions and/or a member of an at-risk group in accordance with the FDA Emergency Use Authorization.    Symptom onset: No symptoms Vaccinated:  Booster?  Immunocompromised? Yes Qualifiers: CAD,COPD  No symptoms.  Emily Rocha

## 2020-05-21 NOTE — Telephone Encounter (Signed)
Scheduled the patient for [facility] 4/20 with Dr. Angelena Form (she works for [employer] and requests to schedule AFTER 4/18).  Confirmed she is completely asymptomatic from Covid symptoms. She understands she will not need to be tested again.  Will send MyChart message with instructions and letter will be mailed per request. She was grateful for call and agrees with plan.

## 2020-05-23 NOTE — Telephone Encounter (Signed)
Spoke with the patient in detail about her heart catheterization.  She understands if the cath is rescheduled to when Dr. Burt Knack has availability, she will have to have another visit and blood work prior to cath. She is very happy to keep as scheduled with Dr. Angelena Form to avoid further appointments and blood sticks.  She was grateful for call and agrees with plan.

## 2020-06-03 NOTE — Pre-Procedure Instructions (Signed)
Attempted to call patient regarding procedure instructions for tomorrow.  Left voice mail on the following things:  Nothing to eat or drink after midnight. Ok to take am medications with the exception of Demadex .  You need responsible adult to drive you home tomorrow as well as stay overnight with you.  Arrival time is 6:30

## 2020-06-04 ENCOUNTER — Ambulatory Visit (HOSPITAL_COMMUNITY)
Admission: RE | Admit: 2020-06-04 | Discharge: 2020-06-04 | Disposition: A | Discharge status: Home / Self Care | Payer: Medicare HMO | Attending: Cardiovascular Disease | Admitting: Cardiovascular Disease

## 2020-06-04 ENCOUNTER — Other Ambulatory Visit: Payer: Self-pay

## 2020-06-04 ENCOUNTER — Encounter (HOSPITAL_COMMUNITY)
Admission: RE | Disposition: A | Discharge status: Home / Self Care | Payer: Self-pay | Source: Home / Self Care | Attending: Cardiovascular Disease

## 2020-06-04 DIAGNOSIS — Z9981 Dependence on supplemental oxygen: Secondary | ICD-10-CM | POA: Insufficient documentation

## 2020-06-04 DIAGNOSIS — I2510 Atherosclerotic heart disease of native coronary artery without angina pectoris: Secondary | ICD-10-CM | POA: Insufficient documentation

## 2020-06-04 DIAGNOSIS — Z79899 Other long term (current) drug therapy: Secondary | ICD-10-CM | POA: Insufficient documentation

## 2020-06-04 DIAGNOSIS — Z8673 Personal history of transient ischemic attack (TIA), and cerebral infarction without residual deficits: Secondary | ICD-10-CM | POA: Diagnosis not present

## 2020-06-04 DIAGNOSIS — I252 Old myocardial infarction: Secondary | ICD-10-CM | POA: Insufficient documentation

## 2020-06-04 DIAGNOSIS — Z8249 Family history of ischemic heart disease and other diseases of the circulatory system: Secondary | ICD-10-CM | POA: Diagnosis not present

## 2020-06-04 DIAGNOSIS — Z85118 Personal history of other malignant neoplasm of bronchus and lung: Secondary | ICD-10-CM | POA: Insufficient documentation

## 2020-06-04 DIAGNOSIS — Z951 Presence of aortocoronary bypass graft: Secondary | ICD-10-CM | POA: Diagnosis not present

## 2020-06-04 DIAGNOSIS — I350 Nonrheumatic aortic (valve) stenosis: Secondary | ICD-10-CM | POA: Insufficient documentation

## 2020-06-04 DIAGNOSIS — Z87891 Personal history of nicotine dependence: Secondary | ICD-10-CM | POA: Diagnosis not present

## 2020-06-04 DIAGNOSIS — I25110 Atherosclerotic heart disease of native coronary artery with unstable angina pectoris: Secondary | ICD-10-CM

## 2020-06-04 DIAGNOSIS — Z7982 Long term (current) use of aspirin: Secondary | ICD-10-CM | POA: Diagnosis not present

## 2020-06-04 DIAGNOSIS — Z7902 Long term (current) use of antithrombotics/antiplatelets: Secondary | ICD-10-CM | POA: Insufficient documentation

## 2020-06-04 DIAGNOSIS — J449 Chronic obstructive pulmonary disease, unspecified: Secondary | ICD-10-CM | POA: Insufficient documentation

## 2020-06-04 DIAGNOSIS — I6529 Occlusion and stenosis of unspecified carotid artery: Secondary | ICD-10-CM | POA: Diagnosis not present

## 2020-06-04 HISTORY — PX: INTRAVASCULAR PRESSURE WIRE/FFR STUDY: CATH118243

## 2020-06-04 HISTORY — PX: RIGHT/LEFT HEART CATH AND CORONARY/GRAFT ANGIOGRAPHY: CATH118267

## 2020-06-04 LAB — POCT I-STAT 7, (LYTES, BLD GAS, ICA,H+H)
Acid-Base Excess: 4 mmol/L — ABNORMAL HIGH (ref 0.0–2.0)
Bicarbonate: 29.9 mmol/L — ABNORMAL HIGH (ref 20.0–28.0)
Calcium, Ion: 1.13 mmol/L — ABNORMAL LOW (ref 1.15–1.40)
HCT: 32 % — ABNORMAL LOW (ref 36.0–46.0)
Hemoglobin: 10.9 g/dL — ABNORMAL LOW (ref 12.0–15.0)
O2 Saturation: 89 %
Potassium: 3.9 mmol/L (ref 3.5–5.1)
Sodium: 142 mmol/L (ref 135–145)
TCO2: 31 mmol/L (ref 22–32)
pCO2 arterial: 52.2 mmHg — ABNORMAL HIGH (ref 32.0–48.0)
pH, Arterial: 7.365 (ref 7.350–7.450)
pO2, Arterial: 60 mmHg — ABNORMAL LOW (ref 83.0–108.0)

## 2020-06-04 LAB — POCT I-STAT EG7
Acid-Base Excess: 5 mmol/L — ABNORMAL HIGH (ref 0.0–2.0)
Bicarbonate: 32.2 mmol/L — ABNORMAL HIGH (ref 20.0–28.0)
Calcium, Ion: 1.2 mmol/L (ref 1.15–1.40)
HCT: 33 % — ABNORMAL LOW (ref 36.0–46.0)
Hemoglobin: 11.2 g/dL — ABNORMAL LOW (ref 12.0–15.0)
O2 Saturation: 59 %
Potassium: 4 mmol/L (ref 3.5–5.1)
Sodium: 141 mmol/L (ref 135–145)
TCO2: 34 mmol/L — ABNORMAL HIGH (ref 22–32)
pCO2, Ven: 59.2 mmHg (ref 44.0–60.0)
pH, Ven: 7.344 (ref 7.250–7.430)
pO2, Ven: 33 mmHg (ref 32.0–45.0)

## 2020-06-04 LAB — POCT ACTIVATED CLOTTING TIME
Activated Clotting Time: 303 seconds
Activated Clotting Time: 214 seconds
Activated Clotting Time: 196 seconds
Activated Clotting Time: 178 seconds

## 2020-06-04 SURGERY — RIGHT/LEFT HEART CATH AND CORONARY/GRAFT ANGIOGRAPHY
Anesthesia: LOCAL

## 2020-06-04 MED ORDER — HEPARIN SODIUM (PORCINE) 1000 UNIT/ML IJ SOLN
INTRAMUSCULAR | Status: DC | PRN
  Administered 2020-06-04: 10000 [IU] via INTRAVENOUS

## 2020-06-04 MED ORDER — LIDOCAINE HCL (PF) 1 % IJ SOLN
INTRAMUSCULAR | Status: AC
  Filled 2020-06-04: qty 30

## 2020-06-04 MED ORDER — SODIUM CHLORIDE 0.9% FLUSH: 3.0000 mL | Freq: Two times a day (BID) | INTRAVENOUS | Status: DC

## 2020-06-04 MED ORDER — MIDAZOLAM HCL 2 MG/2ML IJ SOLN
INTRAMUSCULAR | Status: DC | PRN
  Administered 2020-06-04: 1 mg via INTRAVENOUS

## 2020-06-04 MED ORDER — CLOPIDOGREL BISULFATE 300 MG PO TABS
ORAL_TABLET | ORAL | Status: AC
  Filled 2020-06-04: qty 1

## 2020-06-04 MED ORDER — CLOPIDOGREL BISULFATE 75 MG PO TABS: 75.0000 mg | ORAL_TABLET | Freq: Every day | ORAL | 1 refills | Status: DC

## 2020-06-04 MED ORDER — IOHEXOL 350 MG/ML SOLN
INTRAVENOUS | Status: DC | PRN
Start: 2020-06-04 — End: 2020-06-04
  Administered 2020-06-04: 80 mL

## 2020-06-04 MED ORDER — MIDAZOLAM HCL 2 MG/2ML IJ SOLN
INTRAMUSCULAR | Status: AC
  Filled 2020-06-04: qty 2

## 2020-06-04 MED ORDER — CLOPIDOGREL BISULFATE 75 MG PO TABS
300.0000 mg | ORAL_TABLET | Freq: Once | ORAL | Status: AC
  Administered 2020-06-04: 300 mg via ORAL

## 2020-06-04 MED ORDER — SODIUM CHLORIDE 0.9% FLUSH: 3.0000 mL | INTRAVENOUS | Status: DC | PRN

## 2020-06-04 MED ORDER — FENTANYL CITRATE (PF) 100 MCG/2ML IJ SOLN
50.0000 ug | Freq: Once | INTRAMUSCULAR | Status: AC
  Administered 2020-06-04: 50 ug via INTRAVENOUS

## 2020-06-04 MED ORDER — ASPIRIN 81 MG PO CHEW: 81.0000 mg | CHEWABLE_TABLET | ORAL | Status: DC

## 2020-06-04 MED ORDER — ACETAMINOPHEN 325 MG PO TABS: 650.0000 mg | ORAL_TABLET | ORAL | Status: DC | PRN

## 2020-06-04 MED ORDER — FENTANYL CITRATE (PF) 100 MCG/2ML IJ SOLN
INTRAMUSCULAR | Status: AC
  Filled 2020-06-04: qty 2

## 2020-06-04 MED ORDER — LIDOCAINE HCL (PF) 1 % IJ SOLN
INTRAMUSCULAR | Status: DC | PRN
  Administered 2020-06-04: 12 mL via INTRADERMAL

## 2020-06-04 MED ORDER — HEPARIN (PORCINE) IN NACL 1000-0.9 UT/500ML-% IV SOLN
INTRAVENOUS | Status: AC
  Filled 2020-06-04: qty 1000

## 2020-06-04 MED ORDER — HEPARIN (PORCINE) IN NACL 1000-0.9 UT/500ML-% IV SOLN
INTRAVENOUS | Status: DC | PRN
  Administered 2020-06-04 (×2): 500 mL

## 2020-06-04 MED ORDER — FAMOTIDINE IN NACL 20-0.9 MG/50ML-% IV SOLN
20.0000 mg | Freq: Once | INTRAVENOUS | Status: AC
  Administered 2020-06-04: 20 mg via INTRAVENOUS

## 2020-06-04 MED ORDER — SODIUM CHLORIDE 0.9 % IV SOLN: 250.0000 mL | INTRAVENOUS | Status: DC | PRN

## 2020-06-04 MED ORDER — FAMOTIDINE IN NACL 20-0.9 MG/50ML-% IV SOLN
INTRAVENOUS | Status: AC
  Filled 2020-06-04: qty 50

## 2020-06-04 MED ORDER — SODIUM CHLORIDE 0.9 % WEIGHT BASED INFUSION: 1.0000 mL/kg/h | INTRAVENOUS | Status: DC

## 2020-06-04 MED ORDER — ONDANSETRON HCL 4 MG/2ML IJ SOLN: 4.0000 mg | Freq: Four times a day (QID) | INTRAMUSCULAR | Status: DC | PRN

## 2020-06-04 MED ORDER — SODIUM CHLORIDE 0.9 % IV SOLN
INTRAVENOUS | Status: AC
  Administered 2020-06-04: 75 mL/h via INTRAVENOUS

## 2020-06-04 MED ORDER — OXYCODONE-ACETAMINOPHEN 5-325 MG PO TABS
1.0000 | ORAL_TABLET | Freq: Once | ORAL | Status: AC
  Administered 2020-06-04: 2 via ORAL
  Filled 2020-06-04: qty 2

## 2020-06-04 MED ORDER — SODIUM CHLORIDE 0.9 % WEIGHT BASED INFUSION
3.0000 mL/kg/h | INTRAVENOUS | Status: AC
  Administered 2020-06-04: 3 mL/kg/h via INTRAVENOUS

## 2020-06-04 MED ORDER — FENTANYL CITRATE (PF) 100 MCG/2ML IJ SOLN
INTRAMUSCULAR | Status: DC | PRN
  Administered 2020-06-04: 25 ug via INTRAVENOUS

## 2020-06-04 SURGICAL SUPPLY — 16 items
CATH INFINITI 5FR MULTPACK ANG (CATHETERS) ×2 IMPLANT
CATH SWAN GANZ 7F STRAIGHT (CATHETERS) ×2 IMPLANT
CATH VISTA GUIDE 6FR JR4 (CATHETERS) ×2 IMPLANT
GUIDEWIRE ANGLED .035X150CM (WIRE) ×2 IMPLANT
GUIDEWIRE PRESSURE X 175 (WIRE) ×2 IMPLANT
KIT ESSENTIALS PG (KITS) ×2 IMPLANT
KIT HEART LEFT (KITS) ×2 IMPLANT
MAT PREVALON FULL STRYKER (MISCELLANEOUS) ×2 IMPLANT
PACK CARDIAC CATHETERIZATION (CUSTOM PROCEDURE TRAY) ×2 IMPLANT
SHEATH PINNACLE 5F 10CM (SHEATH) ×2 IMPLANT
SHEATH PINNACLE 6F 10CM (SHEATH) ×2 IMPLANT
SHEATH PINNACLE 7F 10CM (SHEATH) ×2 IMPLANT
SHEATH PROBE COVER 6X72 (BAG) ×2 IMPLANT
TRANSDUCER W/STOPCOCK (MISCELLANEOUS) ×2 IMPLANT
TUBING CIL FLEX 10 FLL-RA (TUBING) ×2 IMPLANT
WIRE EMERALD 3MM-J .035X150CM (WIRE) ×2 IMPLANT

## 2020-06-04 NOTE — Discharge Instructions (Signed)
Femoral Site Care  This sheet gives you information about how to care for yourself after your procedure. Your health care provider may also give you more specific instructions. If you have problems or questions, contact your health care provider. What can I expect after the procedure? After the procedure, it is common to have:  Bruising that usually fades within 1-2 weeks.  Tenderness at the site. Follow these instructions at home: Wound care  Follow instructions from your health care provider about how to take care of your insertion site. Make sure you: ? Wash your hands with soap and water before you change your bandage (dressing). If soap and water are not available, use hand sanitizer. ? Change your dressing as told by your health care provider. ? Leave stitches (sutures), skin glue, or adhesive strips in place. These skin closures may need to stay in place for 2 weeks or longer. If adhesive strip edges start to loosen and curl up, you may trim the loose edges. Do not remove adhesive strips completely unless your health care provider tells you to do that.  Do not take baths, swim, or use a hot tub until your health care provider approves.  You may shower 24-48 hours after the procedure or as told by your health care provider. ? Gently wash the site with plain soap and water. ? Pat the area dry with a clean towel. ? Do not rub the site. This may cause bleeding.  Do not apply powder or lotion to the site. Keep the site clean and dry.  Check your femoral site every day for signs of infection. Check for: ? Redness, swelling, or pain. ? Fluid or blood. ? Warmth. ? Pus or a bad smell. Activity  For the first 2-3 days after your procedure, or as long as directed: ? Avoid climbing stairs as much as possible. ? Do not squat.  Do not lift anything that is heavier than 10 lb (4.5 kg), or the limit that you are told, until your health care provider says that it is safe.  Rest as  directed. ? Avoid sitting for a long time without moving. Get up to take short walks every 1-2 hours.  Do not drive for 24 hours if you were given a medicine to help you relax (sedative). General instructions  Take over-the-counter and prescription medicines only as told by your health care provider.  Keep all follow-up visits as told by your health care provider. This is important. Contact a health care provider if you have:  A fever or chills.  You have redness, swelling, or pain around your insertion site. Get help right away if:  The catheter insertion area swells very fast.  You pass out.  You suddenly start to sweat or your skin gets clammy.  The catheter insertion area is bleeding, and the bleeding does not stop when you hold steady pressure on the area.  The area near or just beyond the catheter insertion site becomes pale, cool, tingly, or numb. These symptoms may represent a serious problem that is an emergency. Do not wait to see if the symptoms will go away. Get medical help right away. Call your local emergency services (911 in the [place] Do not drive yourself to the hospital. Summary  After the procedure, it is common to have bruising that usually fades within 1-2 weeks.  Check your femoral site every day for signs of infection.  Do not lift anything that is heavier than 10 lb (4.5 kg), or  the limit that you are told, until your health care provider says that it is safe. This information is not intended to replace advice given to you by your health care provider. Make sure you discuss any questions you have with your health care provider. Document Revised: 10/05/2019 Document Reviewed: 10/05/2019 Elsevier Patient Education  2021 [place]. Instructions for cath procedure on Wednesday 4/27.  Covid test on 4/25  Nothing to eat or drink after midnight on Tuesday night.   Arrive at [employer] [employer] on Wednesday 4/27 @ 0800 at admitting. Ok to take all morning  medications on Wednesday morning, except no Demadex.

## 2020-06-04 NOTE — Progress Notes (Signed)
Site area: rt groin 52F arterial and 46F venous sheaths Site Prior to Removal:  Level 0 Pressure Applied For: 20 minutes Manual:   yes Patient Status During Pull:  stable Post Pull Site:  Level 0 Post Pull Instructions Given:  yes Post Pull Pulses Present: rt dp palpable Dressing Applied:  Gauze and tegaderm Bedrest begins @ 1350 Comments:

## 2020-06-04 NOTE — Progress Notes (Signed)
Site area: Right groin a 5 french arterial and a 7 French  venous sheath was removed  Site Prior to Removal:  Level 0  Pressure Applied For 20 MINUTES    Bedrest Beginning at   Manual:   Yes.    Patient Status During Pull:  stable  Post Pull Groin Site:  Level 0  Post Pull Instructions Given:  Yes.    Post Pull Pulses Present:  Yes.    Dressing Applied:  Yes.    Comments:

## 2020-06-04 NOTE — Interval H&P Note (Signed)
History and Physical Interval Note:  06/04/2020 8:18 AM  Emily Rocha  has presented today for surgery, with the diagnosis of aortic stenosis.  The various methods of treatment have been discussed with the patient and family. After consideration of risks, benefits and other options for treatment, the patient has consented to  Procedure(s): RIGHT/LEFT HEART CATH AND CORONARY/GRAFT ANGIOGRAPHY (N/A) as a surgical intervention.  The patient's history has been reviewed, patient examined, no change in status, stable for surgery.  I have reviewed the patient's chart and labs.  Questions were answered to the patient's satisfaction.    Cath Lab Visit (complete for each Cath Lab visit)  Clinical Evaluation Leading to the Procedure:   ACS: No.  Non-ACS:    Anginal Classification: CCS II  Anti-ischemic medical therapy: Maximal Therapy (2 or more classes of medications)  Non-Invasive Test Results: No non-invasive testing performed  Prior CABG: Previous CABG        Lauree Chandler

## 2020-06-05 ENCOUNTER — Encounter (HOSPITAL_COMMUNITY): Payer: Self-pay | Admitting: Cardiovascular Disease

## 2020-06-05 MED ORDER — IOHEXOL 350 MG/ML SOLN
INTRAVENOUS | Status: DC | PRN
  Administered 2020-06-04: 80 mL via INTRA_ARTERIAL

## 2020-06-09 ENCOUNTER — Other Ambulatory Visit: Payer: Medicare HMO | Admitting: *Deleted

## 2020-06-09 ENCOUNTER — Other Ambulatory Visit (HOSPITAL_COMMUNITY): Payer: Medicare HMO

## 2020-06-09 ENCOUNTER — Telehealth: Payer: Self-pay | Admitting: *Deleted

## 2020-06-09 ENCOUNTER — Other Ambulatory Visit: Payer: Self-pay

## 2020-06-09 DIAGNOSIS — Z01812 Encounter for preprocedural laboratory examination: Secondary | ICD-10-CM | POA: Diagnosis not present

## 2020-06-09 DIAGNOSIS — I2510 Atherosclerotic heart disease of native coronary artery without angina pectoris: Secondary | ICD-10-CM | POA: Diagnosis not present

## 2020-06-09 LAB — BASIC METABOLIC PANEL
BUN/Creatinine Ratio: 14 (ref 12–28)
BUN: 11 mg/dL (ref 8–27)
CO2: 29 mmol/L (ref 20–29)
Calcium: 10.4 mg/dL — ABNORMAL HIGH (ref 8.7–10.3)
Chloride: 97 mmol/L (ref 96–106)
Creatinine, Ser: 0.77 mg/dL (ref 0.57–1.00)
Glucose: 103 mg/dL — ABNORMAL HIGH (ref 65–99)
Potassium: 4.7 mmol/L (ref 3.5–5.2)
Sodium: 140 mmol/L (ref 134–144)
eGFR: 85 mL/min/{1.73_m2} (ref >59)

## 2020-06-09 LAB — CBC
Hematocrit: 38.1 % (ref 34.0–46.6)
Hemoglobin: 11.5 g/dL (ref 11.1–15.9)
MCH: 23 pg — ABNORMAL LOW (ref 26.6–33.0)
MCHC: 30.2 g/dL — ABNORMAL LOW (ref 31.5–35.7)
MCV: 76 fL — ABNORMAL LOW (ref 79–97)
Platelets: 225 10*3/uL (ref 150–450)
RBC: 5.01 x10E6/uL (ref 3.77–5.28)
RDW: 16.4 % — ABNORMAL HIGH (ref 11.7–15.4)
WBC: 5.3 10*3/uL (ref 3.4–10.8)

## 2020-06-09 NOTE — Telephone Encounter (Addendum)
Coronary Atherectomy 06/11/20 Dr Patrecia Pour scheduled for pre-procedure COVID-19 test today 11:55 AM Patient COVID-19 positive 05/19/20, per Cath Lab protocol, no retest for 90 days if no symptoms concerning for COVID. Patient reports no symptoms concerning for COVID-19, pre-procedure COVID-19 test scheduled for today has been cancelled, pt will get BMP/CBC today at [facility] since last BMP/CBC 05/07/20.

## 2020-06-10 ENCOUNTER — Ambulatory Visit: Payer: Medicare HMO | Admitting: Cardiology

## 2020-06-10 NOTE — Telephone Encounter (Addendum)
Pt contacted pre-coronary atherectomy scheduled at [facility] for: Wednesday June 11, 2020 10 AM Verified arrival time and place: [facility]) at: 8 AM   No solid food after midnight prior to cath, clear liquids until 5 AM day of procedure.  Hold: Torsemide/KCl-AM of procedure  Except hold medications AM meds can be  taken pre-cath with sips of water including: ASA 81 mg Plavix 75 mg  Confirmed patient has responsible adult to drive home post procedure and be with patient first 24 hours after arriving home: yes  You are allowed ONE visitor in the waiting room during the time you are at the hospital for your procedure. Both you and your visitor must wear a mask once you enter the hospital.   Reviewed procedure/mask/visitor instructions with patient.  05/19/20 COVID positive, pt reports no symptoms concerning for COVID-19.

## 2020-06-11 ENCOUNTER — Ambulatory Visit (HOSPITAL_COMMUNITY)
Admission: RE | Disposition: A | Discharge status: Home / Self Care | Payer: Self-pay | Source: Ambulatory Visit | Attending: Cardiovascular Disease

## 2020-06-11 ENCOUNTER — Ambulatory Visit (HOSPITAL_COMMUNITY)
Admission: RE | Admit: 2020-06-11 | Discharge: 2020-06-11 | Disposition: A | Discharge status: Home / Self Care | Payer: Medicare HMO | Source: Ambulatory Visit | Attending: Cardiovascular Disease | Admitting: Cardiovascular Disease

## 2020-06-11 ENCOUNTER — Encounter (HOSPITAL_COMMUNITY): Payer: Self-pay | Admitting: Cardiovascular Disease

## 2020-06-11 ENCOUNTER — Other Ambulatory Visit: Payer: Self-pay

## 2020-06-11 DIAGNOSIS — Z955 Presence of coronary angioplasty implant and graft: Secondary | ICD-10-CM | POA: Diagnosis not present

## 2020-06-11 DIAGNOSIS — I2510 Atherosclerotic heart disease of native coronary artery without angina pectoris: Secondary | ICD-10-CM

## 2020-06-11 DIAGNOSIS — Z7982 Long term (current) use of aspirin: Secondary | ICD-10-CM | POA: Diagnosis not present

## 2020-06-11 DIAGNOSIS — K219 Gastro-esophageal reflux disease without esophagitis: Secondary | ICD-10-CM | POA: Diagnosis not present

## 2020-06-11 DIAGNOSIS — Z7902 Long term (current) use of antithrombotics/antiplatelets: Secondary | ICD-10-CM | POA: Insufficient documentation

## 2020-06-11 DIAGNOSIS — Z79899 Other long term (current) drug therapy: Secondary | ICD-10-CM | POA: Diagnosis not present

## 2020-06-11 DIAGNOSIS — I25118 Atherosclerotic heart disease of native coronary artery with other forms of angina pectoris: Secondary | ICD-10-CM | POA: Insufficient documentation

## 2020-06-11 DIAGNOSIS — I350 Nonrheumatic aortic (valve) stenosis: Secondary | ICD-10-CM | POA: Diagnosis not present

## 2020-06-11 DIAGNOSIS — Z87891 Personal history of nicotine dependence: Secondary | ICD-10-CM | POA: Insufficient documentation

## 2020-06-11 DIAGNOSIS — J441 Chronic obstructive pulmonary disease with (acute) exacerbation: Secondary | ICD-10-CM | POA: Diagnosis present

## 2020-06-11 DIAGNOSIS — Z9861 Coronary angioplasty status: Secondary | ICD-10-CM

## 2020-06-11 DIAGNOSIS — Z951 Presence of aortocoronary bypass graft: Secondary | ICD-10-CM | POA: Diagnosis not present

## 2020-06-11 HISTORY — PX: CORONARY STENT INTERVENTION: CATH118234

## 2020-06-11 HISTORY — PX: CORONARY ATHERECTOMY: CATH118238

## 2020-06-11 HISTORY — DX: Nonrheumatic aortic (valve) stenosis: I35.0

## 2020-06-11 HISTORY — PX: INTRAVASCULAR IMAGING/OCT: CATH118326

## 2020-06-11 LAB — POCT ACTIVATED CLOTTING TIME
Activated Clotting Time: 416 seconds
Activated Clotting Time: 315 seconds

## 2020-06-11 SURGERY — CORONARY ATHERECTOMY
Anesthesia: LOCAL

## 2020-06-11 MED ORDER — ASPIRIN 81 MG PO CHEW: 81.0000 mg | CHEWABLE_TABLET | ORAL | Status: DC

## 2020-06-11 MED ORDER — LABETALOL HCL 5 MG/ML IV SOLN: 10.0000 mg | INTRAVENOUS | Status: AC | PRN

## 2020-06-11 MED ORDER — VERAPAMIL HCL 2.5 MG/ML IV SOLN
INTRAVENOUS | Status: DC | PRN
  Administered 2020-06-11: 10 mL via INTRA_ARTERIAL

## 2020-06-11 MED ORDER — MIDAZOLAM HCL 2 MG/2ML IJ SOLN
INTRAMUSCULAR | Status: AC
  Filled 2020-06-11: qty 2

## 2020-06-11 MED ORDER — VIPERSLIDE LUBRICANT OPTIME
TOPICAL | Status: DC | PRN
  Administered 2020-06-11: 50 mL via SURGICAL_CAVITY

## 2020-06-11 MED ORDER — HEPARIN SODIUM (PORCINE) 1000 UNIT/ML IJ SOLN
INTRAMUSCULAR | Status: DC | PRN
  Administered 2020-06-11: 10000 [IU] via INTRAVENOUS

## 2020-06-11 MED ORDER — HEPARIN (PORCINE) IN NACL 1000-0.9 UT/500ML-% IV SOLN
INTRAVENOUS | Status: DC | PRN
  Administered 2020-06-11 (×2): 500 mL

## 2020-06-11 MED ORDER — HEPARIN (PORCINE) IN NACL 1000-0.9 UT/500ML-% IV SOLN
INTRAVENOUS | Status: AC
  Filled 2020-06-11: qty 1000

## 2020-06-11 MED ORDER — HEPARIN SODIUM (PORCINE) 1000 UNIT/ML IJ SOLN
INTRAMUSCULAR | Status: AC
  Filled 2020-06-11: qty 1

## 2020-06-11 MED ORDER — SODIUM CHLORIDE 0.9% FLUSH: 3.0000 mL | Freq: Two times a day (BID) | INTRAVENOUS | Status: DC

## 2020-06-11 MED ORDER — SODIUM CHLORIDE 0.9 % WEIGHT BASED INFUSION
3.0000 mL/kg/h | INTRAVENOUS | Status: AC
  Administered 2020-06-11: 3 mL/kg/h via INTRAVENOUS

## 2020-06-11 MED ORDER — CLOPIDOGREL BISULFATE 75 MG PO TABS: 75.0000 mg | ORAL_TABLET | ORAL | Status: DC

## 2020-06-11 MED ORDER — SODIUM CHLORIDE 0.9 % IV SOLN: 250.0000 mL | INTRAVENOUS | Status: DC | PRN

## 2020-06-11 MED ORDER — LIDOCAINE HCL (PF) 1 % IJ SOLN
INTRAMUSCULAR | Status: AC
  Filled 2020-06-11: qty 30

## 2020-06-11 MED ORDER — SODIUM CHLORIDE 0.9% FLUSH: 3.0000 mL | INTRAVENOUS | Status: DC | PRN

## 2020-06-11 MED ORDER — ALPRAZOLAM 0.5 MG PO TABS
0.5000 mg | ORAL_TABLET | Freq: Once | ORAL | Status: AC | PRN
  Administered 2020-06-11: 0.5 mg via ORAL
  Filled 2020-06-11: qty 1

## 2020-06-11 MED ORDER — HYDRALAZINE HCL 20 MG/ML IJ SOLN: 10.0000 mg | INTRAMUSCULAR | Status: AC | PRN

## 2020-06-11 MED ORDER — SODIUM CHLORIDE 0.9 % WEIGHT BASED INFUSION: 1.0000 mL/kg/h | INTRAVENOUS | Status: DC

## 2020-06-11 MED ORDER — VERAPAMIL HCL 2.5 MG/ML IV SOLN
INTRAVENOUS | Status: AC
  Filled 2020-06-11: qty 2

## 2020-06-11 MED ORDER — IOHEXOL 350 MG/ML SOLN
INTRAVENOUS | Status: AC
  Filled 2020-06-11: qty 1

## 2020-06-11 MED ORDER — MIDAZOLAM HCL 2 MG/2ML IJ SOLN
INTRAMUSCULAR | Status: DC | PRN
  Administered 2020-06-11: 2 mg via INTRAVENOUS
  Administered 2020-06-11: 1 mg via INTRAVENOUS

## 2020-06-11 MED ORDER — SODIUM CHLORIDE 0.9 % IV SOLN
INTRAVENOUS | Status: DC | PRN
  Administered 2020-06-11: 379 mg via INTRAVENOUS

## 2020-06-11 MED ORDER — IOHEXOL 350 MG/ML SOLN
INTRAVENOUS | Status: DC | PRN
  Administered 2020-06-11: 110 mL

## 2020-06-11 MED ORDER — ACETAMINOPHEN 325 MG PO TABS: 650.0000 mg | ORAL_TABLET | ORAL | Status: DC | PRN

## 2020-06-11 MED ORDER — NITROGLYCERIN 1 MG/10 ML FOR IR/CATH LAB
INTRA_ARTERIAL | Status: AC
  Filled 2020-06-11: qty 10

## 2020-06-11 MED ORDER — FENTANYL CITRATE (PF) 100 MCG/2ML IJ SOLN
INTRAMUSCULAR | Status: DC | PRN
  Administered 2020-06-11: 50 ug via INTRAVENOUS
  Administered 2020-06-11: 25 ug via INTRAVENOUS

## 2020-06-11 MED ORDER — SODIUM CHLORIDE 0.9 % IV SOLN: INTRAVENOUS | Status: AC

## 2020-06-11 MED ORDER — SODIUM CHLORIDE 0.9 % IV SOLN
5.0000 mg/kg | Freq: Once | INTRAVENOUS | Status: AC
  Filled 2020-06-11: qty 15.24

## 2020-06-11 MED ORDER — FENTANYL CITRATE (PF) 100 MCG/2ML IJ SOLN
INTRAMUSCULAR | Status: AC
  Filled 2020-06-11: qty 2

## 2020-06-11 MED ORDER — LIDOCAINE HCL (PF) 1 % IJ SOLN
INTRAMUSCULAR | Status: DC | PRN
  Administered 2020-06-11: 2 mL

## 2020-06-11 MED ORDER — ONDANSETRON HCL 4 MG/2ML IJ SOLN: 4.0000 mg | Freq: Four times a day (QID) | INTRAMUSCULAR | Status: DC | PRN

## 2020-06-11 SURGICAL SUPPLY — 23 items
BALL SAPPHIRE NC24 3.25X22 (BALLOONS) ×2
BALLN SAPPHIRE 2.5X12 (BALLOONS) ×2
BALLN SAPPHIRE ~~LOC~~ 3.75X12 (BALLOONS) ×2 IMPLANT
BALLOON SAPPHIRE 2.5X12 (BALLOONS) ×1 IMPLANT
BALLOON SAPPHIRE NC24 3.25X22 (BALLOONS) ×1 IMPLANT
CATH DRAGONFLY OPTIS 2.7FR (CATHETERS) ×2 IMPLANT
CATH VISTA GUIDE 6FR JR4 (CATHETERS) ×2 IMPLANT
CROWN DIAMONDBACK CLASSIC 1.25 (BURR) ×2 IMPLANT
DEVICE RAD COMP TR BAND LRG (VASCULAR PRODUCTS) ×2 IMPLANT
GLIDESHEATH SLEND SS 6F .021 (SHEATH) ×2 IMPLANT
GUIDEWIRE INQWIRE 1.5J.035X260 (WIRE) ×1 IMPLANT
INQWIRE 1.5J .035X260CM (WIRE) ×2
KIT ENCORE 26 ADVANTAGE (KITS) ×2 IMPLANT
KIT ESSENTIALS PG (KITS) ×2 IMPLANT
KIT HEART LEFT (KITS) ×2 IMPLANT
LUBRICANT VIPERSLIDE CORONARY (MISCELLANEOUS) ×4 IMPLANT
PACK CARDIAC CATHETERIZATION (CUSTOM PROCEDURE TRAY) ×2 IMPLANT
STENT SYNERGY XD 3.0X38 (Permanent Stent) ×1 IMPLANT
SYNERGY XD 3.0X38 (Permanent Stent) ×2 IMPLANT
TRANSDUCER W/STOPCOCK (MISCELLANEOUS) ×2 IMPLANT
TUBING CIL FLEX 10 FLL-RA (TUBING) ×2 IMPLANT
WIRE COUGAR XT STRL 190CM (WIRE) ×2 IMPLANT
WIRE VIPERWIRE COR FLEX .012 (WIRE) ×2 IMPLANT

## 2020-06-11 NOTE — Discharge Instructions (Signed)
Coronary Angiogram With Stent, Care After This sheet gives you information about how to care for yourself after your procedure. Your health care provider may also give you more specific instructions. If you have problems or questions, contact your health care provider. What can I expect after the procedure? After the procedure, it is common to have:  Bruising and tenderness at the insertion site. This usually fades within 1-2 weeks.  A collection of blood under the skin (hematoma). This usually decreases within 1-2 weeks. Follow these instructions at home: Medicines  Take over-the-counter and prescription medicines only as told by your health care provider.  If you were prescribed an antibiotic medicine, take it as told by your health care provider. Do not stop using the antibiotic even if you start to feel better.  If you take medicines for diabetes, your health care provider may need to change how much you take. Ask your health care provider for specific directions about taking your diabetes medicines.  If you are taking blood thinners: ? Talk with your health care provider before you take any medicines that contain aspirin or NSAIDs, such as ibuprofen. These medicines increase your risk for dangerous bleeding. ? Take your medicine exactly as told, at the same time every day. ? Avoid activities that could cause injury or bruising, and follow instructions about how to prevent falls. ? Wear a medical alert bracelet or carry a card that lists what medicines you take. Eating and drinking  Follow instructions from your health care provider about eating or drinking restrictions.  Eat a heart-healthy diet that includes plenty of fresh fruits and vegetables.  Avoid foods that are high in salt, sugar, or saturated fat. Avoid fried foods or canned or highly processed food.  Drink enough fluid to keep your urine pale yellow.   Alcohol use  Do not drink alcohol if: ? Your health care provider  tells you not to. ? You are pregnant, may be pregnant, or plan to become pregnant.  If you drink alcohol: ? Limit how much you use to:  0-1 drink a day for women.  0-2 drinks a day for men. ? Be aware of how much alcohol is in your drink. In the [place] one drink equals one 12 oz bottle of beer (355 mL), one 5 oz glass of wine (148 mL), or one 1 oz glass of hard liquor (44 mL). Bathing  Do not take baths, swim, or use a hot tub until your health care provider approves. Ask your health care provider if you may take showers. You may only be allowed to take sponge baths.  Gently wash the insertion site with plain soap and water.  Pat the area dry with a clean towel. Do not rub. This may cause bleeding. Incision care  Follow instructions from your health care provider about how to take care of your insertion area. Make sure you: ? Wash your hands with soap and water before and after you change your bandage (dressing). If soap and water are not available, use hand sanitizer. ? Change your dressing as told by your health care provider. ? Leave stitches (sutures) or adhesive strips in place. These skin closures may need to stay in place for 2 weeks or longer. If adhesive strip edges start to loosen and curl up, you may trim the loose edges. Do not remove adhesive strips completely unless your health care provider tells you to do that.  Do not apply powder or lotion on the insertion area.  Check your insertion area every day for signs of infection. Check for: ? Redness, swelling, or pain. ? Fluid or blood. ? Warmth. ? Pus or a bad smell. Activity  Do not drive for 24 hours if you were given a sedative during your procedure.  Rest as told by your health care provider. ? Avoid sitting for a long time without moving. Get up to take short walks every 1-2 hours. This is important to improve blood flow and breathing. Ask for help if you feel weak or unsteady.  Do not lift anything that is  heavier than 10 lb (4.5 kg), or the limit that you are told, until your health care provider says that it is safe.  Return to your normal activities as told by your health care provider. Ask your health care provider what activities are safe for you. Lifestyle  Do not use any products that contain nicotine or tobacco, such as cigarettes, e-cigarettes, and chewing tobacco. If you need help quitting, ask your health care provider.  If needed, work with your health care provider to treat other problems, such as being overweight, or having high blood pressure or diabetes.  Get regular exercise. Do exercises as told by your health care provider.   General instructions  Tell all your health care providers that you have a stent. This is especially important if you are going to get imaging studies, such as MRI.  Wear compression stockings as told by your health care provider. These stockings help to prevent blood clots and reduce swelling in your legs.  Do not strain during a bowel movement if the procedure was done through your leg. Straining may cause bleeding from the insertion site.  Keep all follow-up visits as directed by your health care provider. This is important. Contact a health care provider if you:  Have a fever.  Have chills.  Have redness, swelling, or pain around your insertion area.  Have fluid or blood (other than a little blood on the dressing) coming from your insertion area.  Notice that your insertion area feels warm to the touch.  Have pus or a bad smell coming from your insertion area.  Have more bleeding from the insertion area. Hold pressure on the area. Get help right away if:  You develop chest pain or shortness of breath.  You feel like fainting or you faint.  Your leg or arm becomes cool, numb, or tingly.  You have unusual pain.  Your insertion area is bleeding, and bleeding continues after 30 minutes of steadily held pressure.  You develop bleeding  anywhere else, including from your rectum. There may be bright red blood in your urine or stool, or you may have black, tarry stool. These symptoms may represent a serious problem that is an emergency. Do not wait to see if the symptoms will go away. Get medical help right away. Call your local emergency services (911 in the [place] Do not drive yourself to the hospital. Summary  After this procedure, it is common to have bruising and tenderness around the catheter insertion site. This will go away in a few weeks.  Follow your health care provider's instructions about caring for your insertion site. Change dressing and clean the area as instructed.  Eat a heart-healthy diet. Limit alcohol use. Do not use tobacco or nicotine.  Contact a health care provider if you have fever or chills, or if you have pus or a bad smell coming from the site.  Get help right away  if you develop chest pain, you faint, or have bleeding at the insertion site. This information is not intended to replace advice given to you by your health care provider. Make sure you discuss any questions you have with your health care provider. Document Revised: 08/23/2018 Document Reviewed: 08/23/2018 Elsevier Patient Education  [place]  This sheet gives you information about how to care for yourself after your procedure. Your health care provider may also give you more specific instructions. If you have problems or questions, contact your health care provider. What can I expect after the procedure? After the procedure, it is common to have:  Bruising and tenderness at the catheter insertion area. Follow these instructions at home: Medicines  Take over-the-counter and prescription medicines only as told by your health care provider. Insertion site care 1. Follow instructions from your health care provider about how to take care of your insertion site. Make sure you: ? Wash your hands with soap  and water before you remove your bandage (dressing). If soap and water are not available, use hand sanitizer. ? May remove dressing in 24 hours. 2. Check your insertion site every day for signs of infection. Check for: ? Redness, swelling, or pain. ? Fluid or blood. ? Pus or a bad smell. ? Warmth. 3. Do no take baths, swim, or use a hot tub for 5 days. 4. You may shower 24-48 hours after the procedure. ? Remove the dressing and gently wash the site with plain soap and water. ? Pat the area dry with a clean towel. ? Do not rub the site. That could cause bleeding. 5. Do not apply powder or lotion to the site. Activity  1. For 24 hours after the procedure, or as directed by your health care provider: ? Do not flex or bend the affected arm. ? Do not push or pull heavy objects with the affected arm. ? Do not drive yourself home from the hospital or clinic. You may drive 24 hours after the procedure. ? Do not operate machinery or power tools. ? KEEP ARM ELEVATED THE REMAINDER OF THE DAY. 2. Do not push, pull or lift anything that is heavier than 10 lb for 5 days. 3. Ask your health care provider when it is okay to: ? Return to work or school. ? Resume usual physical activities or sports. ? Resume sexual activity. General instructions  If the catheter site starts to bleed, raise your arm and put firm pressure on the site. If the bleeding does not stop, get help right away. This is a medical emergency.  DRINK PLENTY OF FLUIDS FOR THE NEXT 2-3 DAYS.  No alcohol consumption for 24 hours after receiving sedation.  If you went home on the same day as your procedure, a responsible adult should be with you for the first 24 hours after you arrive home.  Keep all follow-up visits as told by your health care provider. This is important. Contact a health care provider if:  You have a fever.  You have redness, swelling, or yellow drainage around your insertion site. Get help right away  if:  You have unusual pain at the radial site.  The catheter insertion area swells very fast.  The insertion area is bleeding, and the bleeding does not stop when you hold steady pressure on the area.  Your arm or hand becomes pale, cool, tingly, or numb. These symptoms may represent a serious problem that is an emergency. Do  not wait to see if the symptoms will go away. Get medical help right away. Call your local emergency services (911 in the [place] Do not drive yourself to the hospital. Summary  After the procedure, it is common to have bruising and tenderness at the site.  Follow instructions from your health care provider about how to take care of your radial site wound. Check the wound every day for signs of infection.  This information is not intended to replace advice given to you by your health care provider. Make sure you discuss any questions you have with your health care provider. Document Revised: 03/09/2017 Document Reviewed: 03/09/2017 Elsevier Patient Education  2020 [employer].

## 2020-06-11 NOTE — Interval H&P Note (Signed)
History and Physical Interval Note:  06/11/2020 9:23 AM  Emily Rocha  has presented today for surgery, with the diagnosis of cad.  The various methods of treatment have been discussed with the patient and family. After consideration of risks, benefits and other options for treatment, the patient has consented to  Procedure(s): CORONARY ATHERECTOMY (N/A) as a surgical intervention.  The patient's history has been reviewed, patient examined, no change in status, stable for surgery.  I have reviewed the patient's chart and labs.  Questions were answered to the patient's satisfaction.    Cath Lab Visit (complete for each Cath Lab visit)  Clinical Evaluation Leading to the Procedure:   ACS: No.  Non-ACS:    Anginal Classification: CCS II  Anti-ischemic medical therapy: Maximal Therapy (2 or more classes of medications)  Non-Invasive Test Results: No non-invasive testing performed  Prior CABG: Previous CABG        Lauree Chandler

## 2020-06-11 NOTE — H&P (Signed)
Cardiology Admission History and Physical:   Patient ID: Emily Rocha MRN: [id number]; DOB: June 02, 1955   Admission date: 06/11/2020  PCP:  Glenda Chroman, MD   [place]  Cardiologist:  Carlyle Dolly, MD   Chief Complaint:  Here for coronary intervention   History of Present Illness:   Emily Rocha is a 65 yo female with history of COPD, CAD s/p CABG, prior stroke, PVD, GERD and severe aortic stenosis who is here today for planned PCI of the RCA. She is undergoing workup for TAVR. Diagnostic cath last week with severe, calcified RCA stenosis. She is here today for orbital atherectomy of the RCA and stenting.   No complaints this am.    Past Medical History:  Diagnosis Date  . Anxiety   . Asthma   . COPD (chronic obstructive pulmonary disease) ([place])   . Coronary artery disease    hx CABG  . GERD (gastroesophageal reflux disease)   . Peripheral vascular disease ([place])   . Pneumonia   . Severe aortic stenosis   . Stroke [facility])    "mini stroke" numbness and weakness to left side of the body    Past Surgical History:  Procedure Laterality Date  . AORTIC ARCH ANGIOGRAPHY N/A 11/04/2017   Procedure: AORTIC ARCH ANGIOGRAPHY;  Surgeon: Elam Dutch, MD;  Location: [place] [facility];  Service: Cardiovascular;  Laterality: N/A;  . APPLICATION OF WOUND VAC N/A 01/06/2018   Procedure: APPLICATION OF WOUND VAC;  Surgeon: Ivin Poot, MD;  Location: [place];  Service: Thoracic;  Laterality: N/A;  . APPLICATION OF WOUND VAC N/A 01/13/2018   Procedure: WOUND VAC CHANGE;  Surgeon: Ivin Poot, MD;  Location: [place];  Service: Thoracic;  Laterality: N/A;  . CARDIAC CATHETERIZATION    . CAROTID-SUBCLAVIAN BYPASS GRAFT Right 12/26/2017   Procedure: AORTIC TO RIGHT COMMON CAROTID AND RIGHT SUBCLAVIAN  ARTERY  BYPASS;  Surgeon: Elam Dutch, MD;  Location: [place];  Service: Vascular;  Laterality: Right;  . CHOLECYSTECTOMY    . CLOSURE OF DIAPHRAGM   12/26/2017   Procedure: REPAIR OF DIAPHRAGM;  Surgeon: Ivin Poot, MD;  Location: [place];  Service: Thoracic;;  . COLONOSCOPY N/A 10/03/2014   Procedure: COLONOSCOPY;  Surgeon: Rogene Houston, MD;  [facility];  Service: Endoscopy;  Laterality: N/A;  730  . CORONARY ARTERY BYPASS GRAFT N/A 12/14/2016   Procedure: CORONARY ARTERY BYPASS GRAFTING (CABG) x three , using left internal mammary artery and right leg greater saphenous vein harvested endoscopically;  Surgeon: Ivin Poot, MD;  Location: [place];  Service: Open Heart Surgery;  Laterality: N/A;  . ENDARTERECTOMY Right 12/06/2014   Procedure: ENDARTERECTOMY CAROTid;  Surgeon: Elam Dutch, MD;  Location: [facility];  Service: Vascular;  Laterality: Right;  . ESOPHAGOGASTRODUODENOSCOPY N/A 12/03/2016   Procedure: ESOPHAGOGASTRODUODENOSCOPY (EGD);  Surgeon: Rogene Houston, MD;  [facility];  Service: Endoscopy;  Laterality: N/A;  7:30  . INTRAVASCULAR PRESSURE WIRE/FFR STUDY N/A 06/04/2020   Procedure: INTRAVASCULAR PRESSURE WIRE/FFR STUDY;  Surgeon: Burnell Blanks, MD;  Location: [place] [facility];  Service: Cardiovascular;  Laterality: N/A;  . LEFT HEART CATH AND CORONARY ANGIOGRAPHY N/A 12/14/2016   Procedure: LEFT HEART CATH AND CORONARY ANGIOGRAPHY;  Surgeon: Burnell Blanks, MD;  Location: [place] [facility];  Service: Cardiovascular;  Laterality: N/A;  . LOBECTOMY Right 10/26/2018   Procedure: RIGHT UPPER LOBECTOMY;  Surgeon: Lajuana Matte, MD;  Location: [facility];  Service: Thoracic;  Laterality: Right;  . PERIPHERAL VASCULAR CATHETERIZATION N/A 12/05/2014   Procedure:  Carotid Arch Angiography;  Surgeon: Conrad [place], MD;  Location: [place] [facility];  Service: Cardiovascular;  Laterality: N/A;  . RIGHT/LEFT HEART CATH AND CORONARY/GRAFT ANGIOGRAPHY N/A 06/04/2020   Procedure: RIGHT/LEFT HEART CATH AND CORONARY/GRAFT ANGIOGRAPHY;  Surgeon: Burnell Blanks, MD;  Location: [place] [facility];  Service: Cardiovascular;  Laterality: N/A;  . STERNAL WOUND DEBRIDEMENT N/A 01/06/2018   Procedure: STERNAL WOUND DEBRIDEMENT;  Surgeon: Ivin Poot, MD;  Location: [place];  Service: Thoracic;  Laterality: N/A;  . STERNOTOMY N/A 12/26/2017   Procedure: REDO STERNOTOMY;  Surgeon: Prescott Gum, Collier Salina, MD;  Location: [place];  Service: Thoracic;  Laterality: N/A;  . TEE WITHOUT CARDIOVERSION N/A 12/14/2016   Procedure: TRANSESOPHAGEAL ECHOCARDIOGRAM (TEE);  Surgeon: Prescott Gum, Collier Salina, MD;  Location: [place];  Service: Open Heart Surgery;  Laterality: N/A;  . VIDEO ASSISTED THORACOSCOPY (VATS)/WEDGE RESECTION Right 10/26/2018   Procedure: VIDEO ASSISTED THORACOSCOPY (VATS)/WEDGE RESECTION;  Surgeon: Lajuana Matte, MD;  Location: [place];  Service: Thoracic;  Laterality: Right;  Marland Kitchen VIDEO BRONCHOSCOPY N/A 10/26/2018   Procedure: VIDEO BRONCHOSCOPY;  Surgeon: Lajuana Matte, MD;  [facility];  Service: Thoracic;  Laterality: N/A;     Medications Prior to Admission: Prior to Admission medications   Medication Sig Start Date End Date Taking? Authorizing Provider  albuterol (VENTOLIN HFA) 108 (90 Base) MCG/ACT inhaler Inhale 1-2 puffs into the lungs every 6 (six) hours as needed for wheezing or shortness of breath.   Yes [provider]  ALPRAZolam Duanne Moron) 0.5 MG tablet Take 0.5 mg by mouth 3 (three) times daily.   Yes [provider]  Ascorbic Acid (VITAMIN C GUMMIE) 120 MG CHEW Chew 500 mg by mouth daily.   Yes [provider]  aspirin EC 81 MG tablet Take 1 tablet (81 mg total) by mouth daily. Swallow whole. Patient taking differently: Take 81 mg by mouth every evening. Swallow whole. 12/21/19  Yes Strader, Cotton, PA-C  atorvastatin (LIPITOR) 40 MG tablet Take 1 tablet (40 mg total) daily by mouth. Patient taking differently: Take 40 mg by mouth in the morning. 12/27/16  Yes Gold, Wayne E, PA-C  calcium carbonate (OS-CAL) 1250 (500 Ca) MG  chewable tablet Chew 1,250 mg by mouth in the morning.   Yes [provider]  cholecalciferol (VITAMIN D3) 25 MCG (1000 UNIT) tablet Take 1,000 Units by mouth in the morning.   Yes [provider]  clopidogrel (PLAVIX) 75 MG tablet Take 1 tablet (75 mg total) by mouth daily. 06/04/20 12/01/20 Yes Cheryln Manly, NP  diphenhydramine-acetaminophen (TYLENOL PM) 25-500 MG TABS tablet Take 2-3 tablets by mouth at bedtime.   Yes [provider]  guaiFENesin (MUCINEX) 600 MG 12 hr tablet Take 1 tablet (600 mg total) by mouth 2 (two) times daily as needed. 11/06/19  Yes Oretha Milch D, MD  Multiple Vitamin (MULTIVITAMIN WITH MINERALS) TABS tablet Take 1 tablet by mouth daily. 12/01/18  Yes Lars Pinks M, PA-C  pantoprazole (PROTONIX) 40 MG tablet Take 40 mg by mouth in the morning.   Yes [provider]  potassium chloride SA (KLOR-CON) 20 MEQ tablet Take 20 mEq by mouth in the morning.   Yes [provider]  torsemide (DEMADEX) 20 MG tablet Take 40 mg by mouth See admin instructions. Take 2 tablets (40 mg) by mouth scheduled on Mondays, Tuesdays,  Wednesdays, Thursdays, Fridays & Saturdays. (Hold dose on Sundays)   Yes [provider]  albuterol (PROVENTIL) (2.5 MG/3ML) 0.083% nebulizer solution Take 2.5 mg by nebulization every 4 (four) hours as needed for wheezing or shortness of breath. 03/30/19   [provider]  isosorbide mononitrate (IMDUR) 30 MG 24 hr tablet Take 0.5 tablets (15 mg total) by mouth daily. 12/21/19 03/20/20  Erma Heritage, PA-C  metoprolol succinate (TOPROL-XL) 50 MG 24 hr tablet Take 1 tablet (50 mg total) by mouth daily. Take with or immediately following a meal. Patient taking differently: Take 50 mg by mouth in the morning. Take with or immediately following a meal. 12/21/19 03/20/20  Strader, Fransisco Hertz, PA-C  Molnupiravir 200 MG CAPS Take 800 mg by mouth every 12 (twelve) hours. 05/20/20   [provider]  predniSONE (DELTASONE) 10 MG tablet Take 10 mg by mouth 2 (two) times daily. 05/19/20   [provider]  Tiotropium Bromide-Olodaterol (STIOLTO RESPIMAT) 2.5-2.5 MCG/ACT AERS Inhale 2 puffs into the lungs daily. Patient taking differently: Inhale 2 puffs into the lungs in the morning. 02/07/20   Parrett, Fonnie Mu, NP     Allergies:   No Known Allergies  Social History:   Social History   Socioeconomic History  . Marital status: Married    Spouse name: Not on file  . Number of children: Not on file  . Years of education: Not on file  . Highest education level: Not on file  Occupational History  . Not on file  Tobacco Use  . Smoking status: Former Smoker    Packs/day: 2.00    Years: 40.00    Pack years: 80.00    Quit date: 10/24/2015    Years since quitting: 4.6  . Smokeless tobacco: Never Used  Vaping Use  . Vaping Use: Former  . Start date: 10/23/2013  . Quit date: 10/24/2015  Substance and Sexual Activity  . Alcohol use: No    Alcohol/week: 0.0 standard drinks  . Drug use: No  . Sexual activity: Not on file  Other Topics Concern  . Not on file  Social History Narrative  . Not on file   Social Determinants of Health   Financial Resource Strain: Not on file  Food Insecurity: Not on file  Transportation Needs: Not on file  Physical Activity: Not on file  Stress: Not on file  Social Connections: Not on file  Intimate Partner Violence: Not on file    Family History:   The patient's family history includes Cancer in her mother; Diabetes in her mother; Heart attack in her father and mother; Heart disease in her father and mother.    ROS:  Please see the history of present illness.  All other ROS reviewed and negative.     Physical Exam/Data:   Vitals:   06/11/20 0851  BP: 100/63  Pulse: 75  Resp: 16  Temp: 98.4 F (36.9 C)  TempSrc: Oral  SpO2: 98%  Weight: 75.8 kg  Height: 5\' 5"  (1.651 m)   No intake or output data in the 24 hours ending 06/11/20  0920 Last 3 Weights 06/11/2020 06/04/2020 05/19/2020  Weight (lbs) 167 lb 168 lb 165 lb  Weight (kg) 75.751 kg 76.204 kg 74.844 kg     Body mass index is 27.79 kg/m.  General:  Well nourished, well developed, in no acute distress HEENT: normal Lymph: no adenopathy Neck: no JVD Endocrine:  No thryomegaly Vascular: No carotid bruits; FA pulses 2+ bilaterally without  bruits  Cardiac:  normal S1, S2; RRR, systolic murmur Lungs:  clear to auscultation bilaterally, no wheezing, rhonchi or rales  Abd: soft, nontender, no hepatomegaly  Ext: no LE edema Musculoskeletal:  No deformities, BUE and BLE strength normal and equal Skin: warm and dry  Neuro:  CNs 2-12 intact, no focal abnormalities noted Psych:  Normal affect   Relevant CV Studies:  Cardiac cath 06/04/20:   Ost LM to LM lesion is 99% stenosed.  Ost 2nd Diag to 2nd Diag lesion is 40% stenosed.  Mid LAD lesion is 30% stenosed.  Prox LAD to Mid LAD lesion is 40% stenosed.  Ost Cx to Prox Cx lesion is 100% stenosed.  Mid RCA lesion is 30% stenosed.  Prox RCA lesion is 80% stenosed.  Ost RPDA to RPDA lesion is 90% stenosed.  Dist RCA lesion is 50% stenosed.  2nd Mrg lesion is 50% stenosed.  Mid LM to Prox LAD lesion is 95% stenosed.  Mid Graft to Insertion lesion is 100% stenosed.   1. Severe left main stenosis 2. Severe ostial LAD stenosis. Patent Vein graft to the Diagonal branch that fills the LAD. The LIMA in injected and does not fill the LAD although it may just be atretic distally. It is not clear that the LIMA was anastomosed to the LAD. The operative note indicates the LIMA was used.  3. Chronic occlusion ostial Circumflex. Patent vein graft to the obtuse marginal branch with moderate disease in the obtuse marginal branch.  4. The RCA is a large dominant vessel. There is a heavily calcified proximal stenosis that appears to be severe angiographically. RFR with flow wire was 0.89 suggesting the lesion is flow  limiting.  5. Severe aortic stenosis (mean gradient 40.3 mmHg, peak to peak gradient 55 mmHg, AVA 0.75 cm2)  Recommendations: She will need to have staged PCI of the RCA with orbital atherectomy and stenting. I will plan this for next week. After the PCI, will then continue planning for TAVR with CT scans.     Indications  Severe aortic stenosis [I35.0 (ICD-10-CM)]  Coronary artery disease involving native coronary artery of native heart with unstable angina pectoris (HCC) [I25.110 (ICD-10-CM)]   Procedural Details  Technical Details Indication: 65 yo female with CAD s/p CABG and now severe aortic stenosis  Procedure: The risks, benefits, complications, treatment options, and expected outcomes were discussed with the patient. The patient and/or family concurred with the proposed plan, giving informed consent. The patient was brought to the cath lab after IV hydration was given. The patient was sedated with Versed and Fentanyl. The right groin was prepped and draped in the usual manner. Using the modified Seldinger access technique, a 5 French sheath was placed in the right femoral artery and a 7 French sheath was placed in the right femoral vein using u/s guidance. Right heart catheterization performed using a balloon tipped catheter. Standard diagnostic catheters were used to perform selective coronary angiography. The JR4 was used to engage the native RCA and all grafts. The LIMA does not appear to have been used for bypass. The JR4 catheter was used to cross the aortic valve. LV pressures measured. No LV gram.  I elected to perform pressure wire assessment of the RCA proximal to mid lesion. She was given 10,000 units IV heparin. ACT over 300. The arterial sheath was upsized to a 6 [place] system. A JR4 guiding catheter was used to engage the RCA. I then passed the Pressure Wire X flow wire down the  RCA. RFR 0.89 beyond the calcified stenosis. The wire and guiding catheter were removed.    There were no immediate complications. The patient was taken to the recovery area in stable condition.  Estimated blood loss <50 mL.   During this procedure medications were administered to achieve and maintain moderate conscious sedation while the patient's heart rate, blood pressure, and oxygen saturation were continuously monitored and I was present face-to-face 100% of this time.   Medications (Filter: Administrations occurring from 0829 to 0942 on 06/04/20) (important) Continuous medications are totaled by the amount administered until 06/04/20 0942.    Heparin (Porcine) in NaCl 1000-0.9 UT/500ML-% SOLN (mL) Total volume:  1,000 mL  Date/Time Rate/Dose/Volume Action   06/04/20 0834 500 mL Given   0834 500 mL Given    midazolam (VERSED) injection (mg) Total dose:  1 mg  Date/Time Rate/Dose/Volume Action   06/04/20 0837 1 mg Given    fentaNYL (SUBLIMAZE) injection (mcg) Total dose:  25 mcg  Date/Time Rate/Dose/Volume Action   06/04/20 0837 25 mcg Given    lidocaine (PF) (XYLOCAINE) 1 % injection (mL) Total volume:  12 mL  Date/Time Rate/Dose/Volume Action   06/04/20 0844 12 mL Given    heparin sodium (porcine) injection (Units) Total dose:  10,000 Units  Date/Time Rate/Dose/Volume Action   06/04/20 0910 10,000 Units Given    iohexol (OMNIPAQUE) 350 MG/ML injection (mL) Total volume:  80 mL  Date/Time Rate/Dose/Volume Action   06/04/20 0930 80 mL Given    Sedation Time  Sedation Time Physician-1: 46 minutes 48 seconds   Radiation/Fluoro  Fluoro time: 10.5 (min) DAP: 22517 (mGycm2) Cumulative Air Kerma: 469 (mGy)   Complications   Complications documented before study signed (06/04/2020 10:03 AM)       Log Level Complications  None Documented by Burnell Blanks, MD 06/04/2020 9:44 AM  Date Found: 06/04/2020  Time Range: Intraprocedure     Coronary Findings   Diagnostic Dominance: Right  Left Main  Ost LM to LM lesion is 99%  stenosed. The lesion is calcified.  Mid LM to Prox LAD lesion is 95% stenosed.  Left Anterior Descending  Vessel is large.  Prox LAD to Mid LAD lesion is 40% stenosed. The lesion is calcified.  Mid LAD lesion is 30% stenosed.  First Diagonal Branch  First Septal Branch  Vessel is small in size.  Second Diagonal Branch  Vessel is moderate in size.  Ost 2nd Diag to 2nd Diag lesion is 40% stenosed.  Second Septal Branch  Vessel is small in size.  Third Diagonal Branch  Vessel is small in size.  Third Septal Branch  Vessel is small in size.  Left Circumflex  Vessel is moderate in size.  Collaterals  Dist Cx filled by collaterals from Inf Sept.    Ost Cx to Prox Cx lesion is 100% stenosed. The lesion is chronically occluded.  Second Obtuse Marginal Branch  2nd Mrg lesion is 50% stenosed.  Right Coronary Artery  Prox RCA lesion is 80% stenosed. The lesion is calcified.  Mid RCA lesion is 30% stenosed. The lesion is calcified.  Dist RCA lesion is 50% stenosed.  Right Posterior Descending Artery  Ost RPDA to RPDA lesion is 90% stenosed. The lesion is calcified.  Graft To 2nd Mrg  Graft To 1st Diag  LIMA Graft To Dist LAD  Mid Graft to Insertion lesion is 100% stenosed. The lesion is chronically occluded.   Intervention   No interventions have been documented.  Coronary Diagrams  Diagnostic Dominance: Right    Intervention    Implants    No implant documentation for this case.    Syngo Images  Show images for CARDIAC CATHETERIZATION  Images on Long Term Storage  Show images for Haydin, Dunn to Procedure Log  Procedure Log     Hemo Data  Flowsheet Row Most Recent Value  Fick Cardiac Output 5.03 L/min  Fick Cardiac Output Index 2.74 (L/min)/BSA  Aortic Mean Gradient 40.3 mmHg  Aortic Peak Gradient 55 mmHg  Aortic Valve Area 0.75  Aortic Value Area Index 0.41 cm2/BSA  RA A Wave 4 mmHg  RA V Wave 10 mmHg  RA Mean 7 mmHg  RV Systolic  Pressure 58 mmHg  RV Diastolic Pressure 2 mmHg  RV EDP 8 mmHg  PA Systolic Pressure 52 mmHg  PA Diastolic Pressure 18 mmHg  PA Mean 31 mmHg  PW A Wave 8 mmHg  PW V Wave 4 mmHg  PW Mean 5 mmHg  AO Systolic Pressure 144 mmHg  AO Diastolic Pressure 54 mmHg  AO Mean 76 mmHg  LV Systolic Pressure 818 mmHg  LV Diastolic Pressure 5 mmHg  LV EDP 12 mmHg  AOp Systolic Pressure 563 mmHg  AOp Diastolic Pressure 57 mmHg  AOp Mean Pressure 83 mmHg  LVp Systolic Pressure 149 mmHg  LVp Diastolic Pressure 6 mmHg  LVp EDP Pressure 13 mmHg  QP/QS 1  TPVR Index 11.31 HRUI  TSVR Index 27.73 HRUI  PVR SVR Ratio 0.38  TPVR/TSVR Ratio 0.41   Echo 04/28/20:  1. Basal inferior akinesis. Left ventricular ejection fraction, by  estimation, is 55 to 60%. The left ventricle has normal function. Left  ventricular diastolic parameters are indeterminate.  2. Right ventricular systolic function is low normal. The right  ventricular size is mildly enlarged.  3. Trivial mitral valve regurgitation.  4. AV is thickened, calcified with restricted motion. Peak and mean  gradients through the valve are 68 and 43 mm Hg respectively Dimensionless  index is 0.23 consistent with severe AS. Compared to echo from 9.19.21,  gradients are increased. The aortic  valve is tricuspid. Aortic valve regurgitation is not visualized.  5. The inferior vena cava is normal in size with greater than 50%  respiratory variability, suggesting right atrial pressure of 3 mmHg.   FINDINGS  Left Ventricle: Basal inferior akinesis. Left ventricular ejection  fraction, by estimation, is 55 to 60%. The left ventricle has normal  function. The left ventricular internal cavity size was normal in size.  There is no left ventricular hypertrophy.  Left ventricular diastolic parameters are indeterminate.   Right Ventricle: The right ventricular size is mildly enlarged. Right  vetricular wall thickness was not assessed. Right  ventricular systolic  function is low normal.   Left Atrium: Left atrial size was normal in size.   Right Atrium: Right atrial size was normal in size.   Pericardium: There is no evidence of pericardial effusion.   Mitral Valve: There is mild thickening of the mitral valve leaflet(s).  Mild to moderate mitral annular calcification. Trivial mitral valve  regurgitation.   Tricuspid Valve: The tricuspid valve is normal in structure. Tricuspid  valve regurgitation is mild.   Aortic Valve: AV is thickened, calcified with restricted motion. Peak and  mean gradients through the valve are 68 and 43 mm Hg respectively  Dimensionless index is 0.23 consistent with severe AS. Compared to echo  from 9.19.21, gradients are increased. The  aortic valve is tricuspid. Aortic  valve regurgitation is not visualized.  Aortic valve mean gradient measures 43.0 mmHg. Aortic valve peak gradient  measures 68.2 mmHg. Aortic valve area, by VTI measures 0.64 cm.   Pulmonic Valve: The pulmonic valve was grossly normal. Pulmonic valve  regurgitation is not visualized.   Aorta: The aortic root is normal in size and structure.   Venous: The inferior vena cava is normal in size with greater than 50%  respiratory variability, suggesting right atrial pressure of 3 mmHg.   IAS/Shunts: The interatrial septum was not assessed.     LEFT VENTRICLE  PLAX 2D  LVIDd:     3.68 cm Diastology  LVIDs:     2.74 cm LV e' medial:  6.74 cm/s  LV PW:     1.13 cm LV E/e' medial: 15.9  LV IVS:    0.89 cm LV e' lateral:  10.80 cm/s  LVOT diam:   1.90 cm LV E/e' lateral: 9.9  LV SV:     61  LV SV Index:  34  LVOT Area:   2.84 cm     RIGHT VENTRICLE  RV S prime:   8.81 cm/s  TAPSE (M-mode): 1.1 cm   LEFT ATRIUM       Index    RIGHT ATRIUM      Index  LA diam:    4.20 cm 2.29 cm/m RA Area:   19.30 cm  LA Vol (A2C):  64.3 ml 35.09 ml/m RA Volume:  55.10 ml  30.07 ml/m  LA Vol (A4C):  60.1 ml 32.80 ml/m  LA Biplane Vol: 62.2 ml 33.95 ml/m  AORTIC VALVE  AV Area (Vmax):  0.58 cm  AV Area (Vmean):  0.54 cm  AV Area (VTI):   0.64 cm  AV Vmax:      413.00 cm/s  AV Vmean:     313.667 cm/s  AV VTI:      0.954 m  AV Peak Grad:   68.2 mmHg  AV Mean Grad:   43.0 mmHg  LVOT Vmax:     84.40 cm/s  LVOT Vmean:    59.967 cm/s  LVOT VTI:     0.217 m  LVOT/AV VTI ratio: 0.23    AORTA  Ao Root diam: 2.50 cm   MITRAL VALVE        TRICUSPID VALVE  MV Area (PHT): 3.46 cm   TR Peak grad:  31.8 mmHg  MV Decel Time: 219 msec   TR Vmax:    282.00 cm/s  MV E velocity: 107.00 cm/s  MV A velocity: 104.00 cm/s SHUNTS  MV E/A ratio: 1.03     Systemic VTI: 0.22 m               Systemic Diam: 1.90 cm   Laboratory Data:  High Sensitivity Troponin:  No results for input(s): TROPONINIHS in the last 720 hours.    Chemistry Recent Labs  Lab 06/09/20 1140  NA 140  K 4.7  CL 97  CO2 29  GLUCOSE 103*  BUN 11  CREATININE 0.77  CALCIUM 10.4*    No results for input(s): PROT, ALBUMIN, AST, ALT, ALKPHOS, BILITOT in the last 168 hours. Hematology Recent Labs  Lab 06/09/20 1140  WBC 5.3  RBC 5.01  HGB 11.5  HCT 38.1  MCV 76*  MCH 23.0*  MCHC 30.2*  RDW 16.4*  PLT 225   BNPNo results for input(s): BNP, PROBNP in the last 168 hours.  DDimer No results for input(s): DDIMER in the last 168  hours.   Radiology/Studies:  No results found.   Assessment and Plan:   1. Severe aortic stenosis 2. Severe stenosis mid RCA  Plan for orbital atherectomy of the RCA today with stenting.    For questions or updates, please contact [place] Please consult [email or web address] for contact info under     Signed, Lauree Chandler, MD  06/11/2020 9:20 AM \

## 2020-06-11 NOTE — Discharge Summary (Addendum)
Discharge Summary for Same Day PCI   Patient ID: Emily Rocha MRN: [id number]; DOB: 01/20/1956  Admit date: 06/11/2020 Discharge date: 06/11/2020  Primary Care Provider: Glenda Chroman, MD  Primary Cardiologist: Carlyle Dolly, MD   Discharge Diagnoses    Principal Problem:   CAD S/P percutaneous coronary angioplasty Active Problems:   COPD exacerbation ([place])   GERD (gastroesophageal reflux disease)   S/P CABG x 3   Severe aortic stenosis  Diagnostic Studies/Procedures    Cardiac Catheterization 06/11/2020:   Ost RPDA to RPDA lesion is 90% stenosed.  Dist RCA lesion is 50% stenosed.  Mid RCA lesion is 30% stenosed.  Prox RCA lesion is 80% stenosed.  Post intervention, there is a 0% residual stenosis.  A drug-eluting stent was successfully placed using a SYNERGY XD 3.0X38.   1. Severe, heavily calcified stenosis in the proximal to mid RCA 2. Successful PTCA/orbital atherectomy/stenting proximal to mid RCA  Recommendations: Will continue DAPT with ASA and Plavix. Continue workup for TAVR  Diagnostic Dominance: Right    Intervention      History of Present Illness     Emily Rocha is a 65 y.o. female with history of COPD, CAD s/p CABG, prior stroke, PVD, GERD and severe aortic stenosis who presented for planned PCI of the RCA with orbital atherectomy of the RCA and stenting after pre-TAVR cath performed 06/04/20 showed ostial RPDA to RPDA lesion is 90% stenosed, dist RCA lesion is 50% stenosed, mid RCA lesion is 30% stenosed, and prox RCA lesion is 80% stenosed that was treated with orbital atherectomy/stenting proximal for heavily calcified vessel.  Hospital Course     The patient underwent cardiac cath as noted above with PCI of the RCA with orbital atherectomy after pre-TAVR cath performed 06/04/20 showing ostial RPDA to RPDA lesion at 90% stenosis, dist RCA lesion is 50% stenosis, mid RCA lesion at 30% stenosis, and prox RCA lesion at 80% stenosis. Plan for  DAPT with ASA/Plavix. The patient was seen by cardiac rehab while in short stay. There were no observed complications post cath. Radial cath site was re-evaluated prior to discharge and found to be stable without any complications. Instructions/precautions regarding cath site care were given prior to discharge.  Emily Rocha was seen by Dr. Angelena Form and determined stable for discharge home. Follow up with our office has been arranged. She will follow with [facility] and Dr. Angelena Form as previously planned. This was communicated to the patient prior to d/c.  _____________  Cath/PCI Registry Performance & Quality Measures: 1. Aspirin prescribed? - Yes 2. ADP Receptor Inhibitor (Plavix/Clopidogrel, Brilinta/Ticagrelor or Effient/Prasugrel) prescribed (includes medically managed patients)? - Yes 3. High Intensity Statin (Lipitor 40-80mg  or Crestor 20-40mg ) prescribed? - Yes 4. For EF <40%, was ACEI/ARB prescribed? - Not Applicable (EF >/= 22%) 5. For EF <40%, Aldosterone Antagonist (Spironolactone or Eplerenone) prescribed? - Not Applicable (EF >/= 48%) 6. Cardiac Rehab Phase II ordered (Included Medically managed Patients)? - Yes ___________  Discharge Vitals Blood pressure (!) 91/43, pulse 83, temperature 98.4 F (36.9 C), temperature source Oral, resp. rate 20, height 5\' 5"  (1.651 m), weight 75.8 kg, SpO2 91 %.  Filed Weights   06/11/20 0851  Weight: 75.8 kg   Last Labs & Radiologic Studies    CBC Recent Labs    06/09/20 1140  WBC 5.3  HGB 11.5  HCT 38.1  MCV 76*  PLT 250   Basic Metabolic Panel Recent Labs    06/09/20 1140  NA 140  K 4.7  CL 97  CO2 29  GLUCOSE 103*  BUN 11  CREATININE 0.77  CALCIUM 10.4*   Liver Function Tests No results for input(s): AST, ALT, ALKPHOS, BILITOT, PROT, ALBUMIN in the last 72 hours. No results for input(s): LIPASE, AMYLASE in the last 72 hours. High Sensitivity Troponin:   No results for input(s): TROPONINIHS in the last 720 hours.   BNP Invalid input(s): POCBNP D-Dimer No results for input(s): DDIMER in the last 72 hours. Hemoglobin A1C No results for input(s): HGBA1C in the last 72 hours. Fasting Lipid Panel No results for input(s): CHOL, HDL, LDLCALC, TRIG, CHOLHDL, LDLDIRECT in the last 72 hours. Thyroid Function Tests No results for input(s): TSH, T4TOTAL, T3FREE, THYROIDAB in the last 72 hours.  Invalid input(s): FREET3 _____________  CARDIAC CATHETERIZATION  Result Date: 06/11/2020  Ost RPDA to RPDA lesion is 90% stenosed.  Dist RCA lesion is 50% stenosed.  Mid RCA lesion is 30% stenosed.  Prox RCA lesion is 80% stenosed.  Post intervention, there is a 0% residual stenosis.  A drug-eluting stent was successfully placed using a SYNERGY XD 3.0X38.  1. Severe, heavily calcified stenosis in the proximal to mid RCA 2. Successful PTCA/orbital atherectomy/stenting proximal to mid RCA Recommendations: Will continue DAPT with ASA and Plavix. Continue workup for TAVR   CARDIAC CATHETERIZATION  Addendum Date: 06/04/2020    Ost LM to LM lesion is 99% stenosed.  Ost 2nd Diag to 2nd Diag lesion is 40% stenosed.  Mid LAD lesion is 30% stenosed.  Prox LAD to Mid LAD lesion is 40% stenosed.  Ost Cx to Prox Cx lesion is 100% stenosed.  Mid RCA lesion is 30% stenosed.  Prox RCA lesion is 80% stenosed.  Ost RPDA to RPDA lesion is 90% stenosed.  Dist RCA lesion is 50% stenosed.  2nd Mrg lesion is 50% stenosed.  Mid LM to Prox LAD lesion is 95% stenosed.  Mid Graft to Insertion lesion is 100% stenosed.  1. Severe left main stenosis 2. Severe ostial LAD stenosis. Patent Vein graft to the Diagonal branch that fills the LAD. The LIMA in injected and does not fill the LAD although it may just be atretic distally. It is not clear that the LIMA was anastomosed to the LAD. The operative note indicates the LIMA was used. 3. Chronic occlusion ostial Circumflex. Patent vein graft to the obtuse marginal branch with moderate disease  in the obtuse marginal branch. 4. The RCA is a large dominant vessel. There is a heavily calcified proximal stenosis that appears to be severe angiographically. RFR with flow wire was 0.89 suggesting the lesion is flow limiting. 5. Severe aortic stenosis (mean gradient 40.3 mmHg, peak to peak gradient 55 mmHg, AVA 0.75 cm2) Recommendations: She will need to have staged PCI of the RCA with orbital atherectomy and stenting. I will plan this for next week. After the PCI, will then continue planning for TAVR with CT scans.   Result Date: 06/04/2020  Ost LM to LM lesion is 99% stenosed.  Ost 2nd Diag to 2nd Diag lesion is 40% stenosed.  Mid LAD lesion is 30% stenosed.  Prox LAD to Mid LAD lesion is 40% stenosed.  Ost Cx to Prox Cx lesion is 100% stenosed.  Mid RCA lesion is 30% stenosed.  Prox RCA lesion is 80% stenosed.  Ost RPDA to RPDA lesion is 90% stenosed.  Dist RCA lesion is 50% stenosed.  2nd Mrg lesion is 50% stenosed.  Mid LM to Prox LAD lesion is  95% stenosed.  1. Severe left main stenosis 2. Severe ostial LAD stenosis. Patent Vein graft to the Diagonal branch that fills the LAD. The LIMA in injected and does not fill the LAD. It is not clear that the LIMA was anastomosed to the LAD. 3. Chronic occlusion ostial Circumflex. Patent vein graft to the obtuse marginal branch with moderate disease in the obtuse marginal branch. 4. The RCA is a large dominant vessel. There is a heavily calcified proximal stenosis that appears to be severe angiographically. RFR with flow wire was 0.89 suggesting the lesion is flow limiting. 5. Severe aortic stenosis (mean gradient 40.3 mmHg, peak to peak gradient 55 mmHg, AVA 0.75 cm2) Recommendations: She will need to have staged PCI of the RCA with orbital atherectomy and stenting. I will plan this for next week. After the PCI, will then continue planning for TAVR with CT scans.   Disposition   Pt is being discharged home today in good condition.  Follow-up Plans  & Appointments   Follow up with Dr. Angelena Form and Cardiac Surgery as previously planned  Discharge Instructions    Amb Referral to Cardiac Rehabilitation   Complete by: As directed    Diagnosis:  Coronary Stents PTCA     After initial evaluation and assessments completed: Virtual Based Care may be provided alone or in conjunction with Phase 2 Cardiac Rehab based on patient barriers.: Yes   Call MD for:  difficulty breathing, headache or visual disturbances   Complete by: As directed    Call MD for:  extreme fatigue   Complete by: As directed    Call MD for:  hives   Complete by: As directed    Call MD for:  persistant dizziness or light-headedness   Complete by: As directed    Call MD for:  persistant nausea and vomiting   Complete by: As directed    Call MD for:  redness, tenderness, or signs of infection (pain, swelling, redness, odor or green/yellow discharge around incision site)   Complete by: As directed    Call MD for:  severe uncontrolled pain   Complete by: As directed    Call MD for:  temperature >100.4   Complete by: As directed    Diet - low sodium heart healthy   Complete by: As directed    Discharge instructions   Complete by: As directed    No driving for 5 days. No lifting over 5 lbs for 1 week. No sexual activity for 1 week. Keep procedure site clean & dry. If you notice increased pain, swelling, bleeding or pus, call/return!  You may shower, but no soaking baths/hot tubs/pools for 1 week.   PLEASE DO NOT MISS ANY DOSES OF YOUR PLAVIX!!!! Also keep a log of you blood pressures and bring back to your follow up appt. Please call the office with any questions.   Patients taking blood thinners should generally stay away from medicines like ibuprofen, Advil, Motrin, naproxen, and Aleve due to risk of stomach bleeding. You may take Tylenol as directed or talk to your primary doctor about alternatives.  Some studies suggest Prilosec/Omeprazole interacts with Plavix. If  you have reflux symptoms, please use Protonix for less chance of interaction.  PLEASE ENSURE THAT YOU DO NOT RUN OUT OF YOUR PLAVIX. IF you have issues obtaining this medication due to cost please CALL the office 3-5 business days prior to running out in order to prevent missing doses of this medication.   Increase activity slowly  Complete by: As directed      Discharge Medications   Allergies as of 06/11/2020   No Known Allergies     Medication List    TAKE these medications   albuterol 108 (90 Base) MCG/ACT inhaler Commonly known as: VENTOLIN HFA Inhale 1-2 puffs into the lungs every 6 (six) hours as needed for wheezing or shortness of breath.   albuterol (2.5 MG/3ML) 0.083% nebulizer solution Commonly known as: PROVENTIL Take 2.5 mg by nebulization every 4 (four) hours as needed for wheezing or shortness of breath.   ALPRAZolam 0.5 MG tablet Commonly known as: XANAX Take 0.5 mg by mouth 3 (three) times daily.   aspirin EC 81 MG tablet Take 1 tablet (81 mg total) by mouth daily. Swallow whole. What changed: when to take this   atorvastatin 40 MG tablet Commonly known as: Lipitor Take 1 tablet (40 mg total) daily by mouth. What changed: when to take this   calcium carbonate 1250 (500 Ca) MG chewable tablet Commonly known as: OS-CAL Chew 1,250 mg by mouth in the morning.   cholecalciferol 25 MCG (1000 UNIT) tablet Commonly known as: VITAMIN D3 Take 1,000 Units by mouth in the morning.   clopidogrel 75 MG tablet Commonly known as: Plavix Take 1 tablet (75 mg total) by mouth daily.   diphenhydramine-acetaminophen 25-500 MG Tabs tablet Commonly known as: TYLENOL PM Take 2-3 tablets by mouth at bedtime.   guaiFENesin 600 MG 12 hr tablet Commonly known as: MUCINEX Take 1 tablet (600 mg total) by mouth 2 (two) times daily as needed.   isosorbide mononitrate 30 MG 24 hr tablet Commonly known as: IMDUR Take 0.5 tablets (15 mg total) by mouth daily.   metoprolol  succinate 50 MG 24 hr tablet Commonly known as: TOPROL-XL Take 1 tablet (50 mg total) by mouth daily. Take with or immediately following a meal. What changed: when to take this   Molnupiravir 200 MG Caps Take 800 mg by mouth every 12 (twelve) hours.   multivitamin with minerals Tabs tablet Take 1 tablet by mouth daily.   pantoprazole 40 MG tablet Commonly known as: PROTONIX Take 40 mg by mouth in the morning.   potassium chloride SA 20 MEQ tablet Commonly known as: KLOR-CON Take 20 mEq by mouth in the morning.   predniSONE 10 MG tablet Commonly known as: DELTASONE Take 10 mg by mouth 2 (two) times daily.   Stiolto Respimat 2.5-2.5 MCG/ACT Aers Generic drug: Tiotropium Bromide-Olodaterol Inhale 2 puffs into the lungs daily. What changed: when to take this   torsemide 20 MG tablet Commonly known as: DEMADEX Take 40 mg by mouth See admin instructions. Take 2 tablets (40 mg) by mouth scheduled on Mondays, Tuesdays, Wednesdays, Thursdays, Fridays & Saturdays. (Hold dose on Sundays)   Vitamin C Gummie 120 MG Chew Generic drug: Ascorbic Acid Chew 500 mg by mouth daily.       Allergies No Known Allergies  Outstanding Labs/Studies   None   Duration of Discharge Encounter   Greater than 30 minutes including physician time.  Signed, Kathyrn Drown, NP 06/11/2020, 3:16 PM

## 2020-06-11 NOTE — Progress Notes (Signed)
Discussed stent, Plavix, restrictions, diet, exercise, NTG, and CRPII. Pt receptive but feels anxious. Will refer to [place] however will need to await TAVR to begin. Understands importance of Plavix. She quit smoking 4 years ago but unfortunately her husband still smokes. Encouraged her to ask him to smoke outside. [place], ACSM 1:37 PM 06/11/2020

## 2020-06-12 ENCOUNTER — Encounter (HOSPITAL_COMMUNITY): Payer: Self-pay | Admitting: Cardiovascular Disease

## 2020-06-12 MED FILL — Nitroglycerin IV Soln 100 MCG/ML in D5W: INTRA_ARTERIAL | Qty: 10 | Status: AC

## 2020-06-16 NOTE — Progress Notes (Signed)
[facility] Consult Note  Chief Complaint  Patient presents with  . New Patient (Initial Visit)    Severe aortic stenosis   History of Present Illness: 65 yo female with history of CAD s/p CABG, COPD, prior stroke, PVD, GERD and severe aortic stenosis who is here to discuss her aortic stenosis and possible TAVR. She is followed in our office by Dr. Harl Bowie. She had a 3 vessel CABG in October 2018 after presenting with a NSTEMI. She has carotid artery disease and has undergone right carotid endarterectomy in 2016 and right subclavian/right carotid bypass in 2019. She has been followed for aortic stenosis. She reported more dyspnea on exertion when she was seen by Dr. Harl Bowie in March 2022. Echo 04/28/20 with LVEF=55-60%. The aortic valve leaflets are thickened and calcified with limited leaflet excursion. Mean gradient 43 mm Hg, peak gradient 68 mmHg, AVA 0.54 cm2, dimensionless index 0.23, SVI 34. This is consistent with severe aortic stenosis. Cardiac cath 06/04/20 with severe left main stenosis. The diagonal and LAD fills from the patent vein graft to the diagonal. The LIMA is atretic. The chronically occluded Circumflex fills from the patent vein graft. The RCA had a severe, heavily calcified stenosis. Severe AS by cath with mean gradient 40.3 mmHg, peak to peak gradient 55 mmHg, AVA 0.75 cm2). She was brought back 06/12/20 for staged PCI of the RCA. The RCA was treated with orbital atherectomy and a drug eluting stent was placed in the proximal RCA. She is s/p right upper lobectomy in 2020 for adenocarcinoma of the right upper lobe. She was admitted to [facility] September 2021 with acute respiratory failure secondary to pneumonia.   She tells me today that she feels 90% better post PCI. She has almost complete resolution of her dyspnea but still has mild dyspnea at times. She has no chest pain. Lower extremity edema controlled with torsemide. She lives near [place], [place] with her husband. She  has full dentures. 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   Primary Care Physician: Glenda Chroman, MD Primary Cardiologist: Harl Bowie Referring Cardiologist: Harl Bowie  Past Medical History:  Diagnosis Date  . Anxiety   . Asthma   . COPD (chronic obstructive pulmonary disease) ([place])   . Coronary artery disease    hx CABG  . GERD (gastroesophageal reflux disease)   . Peripheral vascular disease ([place])   . Pneumonia   . Severe aortic stenosis   . Stroke [facility])    "mini stroke" numbness and weakness to left side of the body    Past Surgical History:  Procedure Laterality Date  . AORTIC ARCH ANGIOGRAPHY N/A 11/04/2017   Procedure: AORTIC ARCH ANGIOGRAPHY;  Surgeon: Elam Dutch, MD;  Location: [place] [facility];  Service: Cardiovascular;  Laterality: N/A;  . APPLICATION OF WOUND VAC N/A 01/06/2018   Procedure: APPLICATION OF WOUND VAC;  Surgeon: Ivin Poot, MD;  Location: [place];  Service: Thoracic;  Laterality: N/A;  . APPLICATION OF WOUND VAC N/A 01/13/2018   Procedure: WOUND VAC CHANGE;  Surgeon: Ivin Poot, MD;  Location: [place];  Service: Thoracic;  Laterality: N/A;  . CARDIAC CATHETERIZATION    . CAROTID-SUBCLAVIAN BYPASS GRAFT Right 12/26/2017   Procedure: AORTIC TO RIGHT COMMON CAROTID AND RIGHT SUBCLAVIAN  ARTERY  BYPASS;  Surgeon: Elam Dutch, MD;  Location: [place];  Service: Vascular;  Laterality: Right;  . CHOLECYSTECTOMY    . CLOSURE OF DIAPHRAGM  12/26/2017   Procedure: REPAIR  OF DIAPHRAGM;  Surgeon: Prescott Gum, Collier Salina, MD;  Location: [place];  Service: Thoracic;;  . COLONOSCOPY N/A 10/03/2014   Procedure: COLONOSCOPY;  Surgeon: Rogene Houston, MD;  [facility];  Service: Endoscopy;  Laterality: N/A;  730  . CORONARY ARTERY BYPASS GRAFT N/A 12/14/2016   Procedure: CORONARY ARTERY BYPASS GRAFTING (CABG) x three , using left internal mammary artery and right leg greater saphenous vein harvested endoscopically;  Surgeon: Ivin Poot, MD;   Location: [place];  Service: Open Heart Surgery;  Laterality: N/A;  . CORONARY ATHERECTOMY N/A 06/11/2020   Procedure: CORONARY ATHERECTOMY;  Surgeon: Burnell Blanks, MD;  Location: [place] [facility];  Service: Cardiovascular;  Laterality: N/A;  . CORONARY STENT INTERVENTION N/A 06/11/2020   Procedure: CORONARY STENT INTERVENTION;  Surgeon: Burnell Blanks, MD;  Location: [place] [facility];  Service: Cardiovascular;  Laterality: N/A;  . ENDARTERECTOMY Right 12/06/2014   Procedure: ENDARTERECTOMY CAROTid;  Surgeon: Elam Dutch, MD;  Location: [facility];  Service: Vascular;  Laterality: Right;  . ESOPHAGOGASTRODUODENOSCOPY N/A 12/03/2016   Procedure: ESOPHAGOGASTRODUODENOSCOPY (EGD);  Surgeon: Rogene Houston, MD;  [facility];  Service: Endoscopy;  Laterality: N/A;  7:30  . INTRAVASCULAR IMAGING/OCT N/A 06/11/2020   Procedure: INTRAVASCULAR IMAGING/OCT;  Surgeon: Burnell Blanks, MD;  Location: [place] [facility];  Service: Cardiovascular;  Laterality: N/A;  . INTRAVASCULAR PRESSURE WIRE/FFR STUDY N/A 06/04/2020   Procedure: INTRAVASCULAR PRESSURE WIRE/FFR STUDY;  Surgeon: Burnell Blanks, MD;  Location: [place] [facility];  Service: Cardiovascular;  Laterality: N/A;  . LEFT HEART CATH AND CORONARY ANGIOGRAPHY N/A 12/14/2016   Procedure: LEFT HEART CATH AND CORONARY ANGIOGRAPHY;  Surgeon: Burnell Blanks, MD;  Location: [place] [facility];  Service: Cardiovascular;  Laterality: N/A;  . LOBECTOMY Right 10/26/2018   Procedure: RIGHT UPPER LOBECTOMY;  Surgeon: Lajuana Matte, MD;  Location: [place];  Service: Thoracic;  Laterality: Right;  . PERIPHERAL VASCULAR CATHETERIZATION N/A 12/05/2014   Procedure:  Carotid Arch Angiography;  Surgeon: Conrad Rosebush, MD;  Location: [place] [facility];  Service: Cardiovascular;  Laterality: N/A;  . RIGHT/LEFT HEART CATH AND CORONARY/GRAFT ANGIOGRAPHY N/A 06/04/2020   Procedure: RIGHT/LEFT HEART CATH AND  CORONARY/GRAFT ANGIOGRAPHY;  Surgeon: Burnell Blanks, MD;  Location: [place] [facility];  Service: Cardiovascular;  Laterality: N/A;  . STERNAL WOUND DEBRIDEMENT N/A 01/06/2018   Procedure: STERNAL WOUND DEBRIDEMENT;  Surgeon: Ivin Poot, MD;  Location: [place];  Service: Thoracic;  Laterality: N/A;  . STERNOTOMY N/A 12/26/2017   Procedure: REDO STERNOTOMY;  Surgeon: Prescott Gum, Collier Salina, MD;  Location: [place];  Service: Thoracic;  Laterality: N/A;  . TEE WITHOUT CARDIOVERSION N/A 12/14/2016   Procedure: TRANSESOPHAGEAL ECHOCARDIOGRAM (TEE);  Surgeon: Prescott Gum, Collier Salina, MD;  Location: [place];  Service: Open Heart Surgery;  Laterality: N/A;  . VIDEO ASSISTED THORACOSCOPY (VATS)/WEDGE RESECTION Right 10/26/2018   Procedure: VIDEO ASSISTED THORACOSCOPY (VATS)/WEDGE RESECTION;  Surgeon: Lajuana Matte, MD;  Location: [place];  Service: Thoracic;  Laterality: Right;  Marland Kitchen VIDEO BRONCHOSCOPY N/A 10/26/2018   Procedure: VIDEO BRONCHOSCOPY;  Surgeon: Lajuana Matte, MD;  Location: [facility];  Service: Thoracic;  Laterality: N/A;    Current Outpatient Medications  Medication Sig Dispense Refill  . albuterol (PROVENTIL) (2.5 MG/3ML) 0.083% nebulizer solution Take 2.5 mg by nebulization every 4 (four) hours as needed for wheezing or shortness of breath.    Marland Kitchen albuterol (VENTOLIN HFA) 108 (90 Base) MCG/ACT inhaler  Inhale 1-2 puffs into the lungs every 6 (six) hours as needed for wheezing or shortness of breath.    . ALPRAZolam (XANAX) 0.5 MG tablet Take 0.5 mg by mouth 3 (three) times daily.    . Ascorbic Acid (VITAMIN C GUMMIE) 120 MG CHEW Chew 500 mg by mouth daily.    Marland Kitchen aspirin EC 81 MG tablet Take 1 tablet (81 mg total) by mouth daily. Swallow whole. 90 tablet 3  . atorvastatin (LIPITOR) 40 MG tablet Take 1 tablet (40 mg total) daily by mouth. 30 tablet 1  . calcium carbonate (OS-CAL) 1250 (500 Ca) MG chewable tablet Chew 1,250 mg by mouth in the morning.    . cholecalciferol (VITAMIN D3) 25 MCG  (1000 UNIT) tablet Take 1,000 Units by mouth in the morning.    . clopidogrel (PLAVIX) 75 MG tablet Take 1 tablet (75 mg total) by mouth daily. 90 tablet 1  . diphenhydramine-acetaminophen (TYLENOL PM) 25-500 MG TABS tablet Take 2-3 tablets by mouth at bedtime.    Marland Kitchen guaiFENesin (MUCINEX) 600 MG 12 hr tablet Take 1 tablet (600 mg total) by mouth 2 (two) times daily as needed. 60 tablet 1  . isosorbide mononitrate (IMDUR) 30 MG 24 hr tablet Take 0.5 tablets (15 mg total) by mouth daily. 45 tablet 3  . metoprolol succinate (TOPROL-XL) 50 MG 24 hr tablet Take 1 tablet (50 mg total) by mouth daily. Take with or immediately following a meal. 90 tablet 3  . Multiple Vitamin (MULTIVITAMIN WITH MINERALS) TABS tablet Take 1 tablet by mouth daily.    . pantoprazole (PROTONIX) 40 MG tablet Take 40 mg by mouth in the morning.    . potassium chloride SA (KLOR-CON) 20 MEQ tablet Take 20 mEq by mouth in the morning.    . predniSONE (DELTASONE) 10 MG tablet Take 10 mg by mouth 2 (two) times daily.    . Tiotropium Bromide-Olodaterol (STIOLTO RESPIMAT) 2.5-2.5 MCG/ACT AERS Inhale 2 puffs into the lungs daily. 1 each 4  . torsemide (DEMADEX) 20 MG tablet Take 40 mg by mouth See admin instructions. Take 2 tablets (40 mg) by mouth scheduled on Mondays, Tuesdays, Wednesdays, Thursdays, Fridays & Saturdays. (Hold dose on Sundays)     Current Facility-Administered Medications  Medication Dose Route Frequency Provider Last Rate Last Admin  . sodium chloride flush (NS) 0.9 % injection 3 mL  3 mL Intravenous Q12H Branch, Alphonse Guild, MD        No Known Allergies  Social History   Socioeconomic History  . Marital status: Married    Spouse name: Not on file  . Number of children: Not on file  . Years of education: Not on file  . Highest education level: Not on file  Occupational History  . Not on file  Tobacco Use  . Smoking status: Former Smoker    Packs/day: 2.00    Years: 40.00    Pack years: 80.00    Quit  date: 10/24/2015    Years since quitting: 4.6  . Smokeless tobacco: Never Used  Vaping Use  . Vaping Use: Former  . Start date: 10/23/2013  . Quit date: 10/24/2015  Substance and Sexual Activity  . Alcohol use: No    Alcohol/week: 0.0 standard drinks  . Drug use: No  . Sexual activity: Not on file  Other Topics Concern  . Not on file  Social History Narrative  . Not on file   Social Determinants of Health   Financial Resource Strain: Not on file  Food Insecurity: Not on file  Transportation Needs: Not on file  Physical Activity: Not on file  Stress: Not on file  Social Connections: Not on file  Intimate Partner Violence: Not on file    Family History  Problem Relation Age of Onset  . Heart attack Mother   . Cancer Mother   . Diabetes Mother   . Heart disease Mother   . Heart attack Father   . Heart disease Father     Review of Systems:  As stated in the HPI and otherwise negative.   BP 122/60   Pulse 80   Ht 5\' 5"  (1.651 m)   Wt 170 lb (77.1 kg)   SpO2 97%   BMI 28.29 kg/m   Physical Examination: General: Well developed, well nourished, NAD  HEENT: OP clear, mucus membranes moist  SKIN: warm, dry. No rashes. Neuro: No focal deficits  Musculoskeletal: Muscle strength 5/5 all ext  Psychiatric: Mood and affect normal  Neck: No JVD, no carotid bruits, no thyromegaly, no lymphadenopathy.  Lungs:Clear bilaterally, no wheezes, rhonci, crackles Cardiovascular: Regular rate and rhythm. Loud, harsh, late peaking systolic murmur.  Abdomen:Soft. Bowel sounds present. Non-tender.  Extremities: No lower extremity edema. Pulses are 2 + in the bilateral DP/PT.  EKG:  EKG is not ordered today. The ekg from 06/12/20 is reviewed and shows NSR  Echo 04/28/20: 1. Basal inferior akinesis. Left ventricular ejection fraction, by  estimation, is 55 to 60%. The left ventricle has normal function. Left  ventricular diastolic parameters are indeterminate.  2. Right ventricular  systolic function is low normal. The right  ventricular size is mildly enlarged.  3. Trivial mitral valve regurgitation.  4. AV is thickened, calcified with restricted motion. Peak and mean  gradients through the valve are 68 and 43 mm Hg respectively Dimensionless  index is 0.23 consistent with severe AS. Compared to echo from 9.19.21,  gradients are increased. The aortic  valve is tricuspid. Aortic valve regurgitation is not visualized.  5. The inferior vena cava is normal in size with greater than 50%  respiratory variability, suggesting right atrial pressure of 3 mmHg.   FINDINGS  Left Ventricle: Basal inferior akinesis. Left ventricular ejection  fraction, by estimation, is 55 to 60%. The left ventricle has normal  function. The left ventricular internal cavity size was normal in size.  There is no left ventricular hypertrophy.  Left ventricular diastolic parameters are indeterminate.   Right Ventricle: The right ventricular size is mildly enlarged. Right  vetricular wall thickness was not assessed. Right ventricular systolic  function is low normal.   Left Atrium: Left atrial size was normal in size.   Right Atrium: Right atrial size was normal in size.   Pericardium: There is no evidence of pericardial effusion.   Mitral Valve: There is mild thickening of the mitral valve leaflet(s).  Mild to moderate mitral annular calcification. Trivial mitral valve  regurgitation.   Tricuspid Valve: The tricuspid valve is normal in structure. Tricuspid  valve regurgitation is mild.   Aortic Valve: AV is thickened, calcified with restricted motion. Peak and  mean gradients through the valve are 68 and 43 mm Hg respectively  Dimensionless index is 0.23 consistent with severe AS. Compared to echo  from 9.19.21, gradients are increased. The  aortic valve is tricuspid. Aortic valve regurgitation is not visualized.  Aortic valve mean gradient measures 43.0 mmHg. Aortic valve peak  gradient  measures 68.2 mmHg. Aortic valve area, by VTI measures 0.64 cm.  Pulmonic Valve: The pulmonic valve was grossly normal. Pulmonic valve  regurgitation is not visualized.   Aorta: The aortic root is normal in size and structure.   Venous: The inferior vena cava is normal in size with greater than 50%  respiratory variability, suggesting right atrial pressure of 3 mmHg.   IAS/Shunts: The interatrial septum was not assessed.     LEFT VENTRICLE  PLAX 2D  LVIDd:     3.68 cm Diastology  LVIDs:     2.74 cm LV e' medial:  6.74 cm/s  LV PW:     1.13 cm LV E/e' medial: 15.9  LV IVS:    0.89 cm LV e' lateral:  10.80 cm/s  LVOT diam:   1.90 cm LV E/e' lateral: 9.9  LV SV:     61  LV SV Index:  34  LVOT Area:   2.84 cm     RIGHT VENTRICLE  RV S prime:   8.81 cm/s  TAPSE (M-mode): 1.1 cm   LEFT ATRIUM       Index    RIGHT ATRIUM      Index  LA diam:    4.20 cm 2.29 cm/m RA Area:   19.30 cm  LA Vol (A2C):  64.3 ml 35.09 ml/m RA Volume:  55.10 ml 30.07 ml/m  LA Vol (A4C):  60.1 ml 32.80 ml/m  LA Biplane Vol: 62.2 ml 33.95 ml/m  AORTIC VALVE  AV Area (Vmax):  0.58 cm  AV Area (Vmean):  0.54 cm  AV Area (VTI):   0.64 cm  AV Vmax:      413.00 cm/s  AV Vmean:     313.667 cm/s  AV VTI:      0.954 m  AV Peak Grad:   68.2 mmHg  AV Mean Grad:   43.0 mmHg  LVOT Vmax:     84.40 cm/s  LVOT Vmean:    59.967 cm/s  LVOT VTI:     0.217 m  LVOT/AV VTI ratio: 0.23    AORTA  Ao Root diam: 2.50 cm   MITRAL VALVE        TRICUSPID VALVE  MV Area (PHT): 3.46 cm   TR Peak grad:  31.8 mmHg  MV Decel Time: 219 msec   TR Vmax:    282.00 cm/s  MV E velocity: 107.00 cm/s  MV A velocity: 104.00 cm/s SHUNTS  MV E/A ratio: 1.03     Systemic VTI: 0.22 m               Systemic Diam: 1.90 cm   Cardiac cath 06/04/20:   Ost LM to LM lesion is  99% stenosed.  Ost 2nd Diag to 2nd Diag lesion is 40% stenosed.  Mid LAD lesion is 30% stenosed.  Prox LAD to Mid LAD lesion is 40% stenosed.  Ost Cx to Prox Cx lesion is 100% stenosed.  Mid RCA lesion is 30% stenosed.  Prox RCA lesion is 80% stenosed.  Ost RPDA to RPDA lesion is 90% stenosed.  Dist RCA lesion is 50% stenosed.  2nd Mrg lesion is 50% stenosed.  Mid LM to Prox LAD lesion is 95% stenosed.  Mid Graft to Insertion lesion is 100% stenosed.   1. Severe left main stenosis 2. Severe ostial LAD stenosis. Patent Vein graft to the Diagonal branch that fills the LAD. The LIMA in injected and does not fill the LAD although it may just be atretic distally. It is not clear that the LIMA was anastomosed to the  LAD. The operative note indicates the LIMA was used.  3. Chronic occlusion ostial Circumflex. Patent vein graft to the obtuse marginal branch with moderate disease in the obtuse marginal branch.  4. The RCA is a large dominant vessel. There is a heavily calcified proximal stenosis that appears to be severe angiographically. RFR with flow wire was 0.89 suggesting the lesion is flow limiting.  5. Severe aortic stenosis (mean gradient 40.3 mmHg, peak to peak gradient 55 mmHg, AVA 0.75 cm2)  Recommendations: She will need to have staged PCI of the RCA with orbital atherectomy and stenting. I will plan this for next week. After the PCI, will then continue planning for TAVR with CT scans.     Indications  Severe aortic stenosis [I35.0 (ICD-10-CM)]  Coronary artery disease involving native coronary artery of native heart with unstable angina pectoris (HCC) [I25.110 (ICD-10-CM)]   Procedural Details  Technical Details Indication: 65 yo female with CAD s/p CABG and now severe aortic stenosis  Procedure: The risks, benefits, complications, treatment options, and expected outcomes were discussed with the patient. The patient and/or family concurred with the proposed plan,  giving informed consent. The patient was brought to the cath lab after IV hydration was given. The patient was sedated with Versed and Fentanyl. The right groin was prepped and draped in the usual manner. Using the modified Seldinger access technique, a 5 French sheath was placed in the right femoral artery and a 7 French sheath was placed in the right femoral vein using u/s guidance. Right heart catheterization performed using a balloon tipped catheter. Standard diagnostic catheters were used to perform selective coronary angiography. The JR4 was used to engage the native RCA and all grafts. The LIMA does not appear to have been used for bypass. The JR4 catheter was used to cross the aortic valve. LV pressures measured. No LV gram.  I elected to perform pressure wire assessment of the RCA proximal to mid lesion. She was given 10,000 units IV heparin. ACT over 300. The arterial sheath was upsized to a 6 [place] system. A JR4 guiding catheter was used to engage the RCA. I then passed the Pressure Wire X flow wire down the RCA. RFR 0.89 beyond the calcified stenosis. The wire and guiding catheter were removed.   There were no immediate complications. The patient was taken to the recovery area in stable condition.  Estimated blood loss <50 mL.   During this procedure medications were administered to achieve and maintain moderate conscious sedation while the patient's heart rate, blood pressure, and oxygen saturation were continuously monitored and I was present face-to-face 100% of this time.   Medications (Filter: Administrations occurring from 0829 to 0942 on 06/04/20) (important) Continuous medications are totaled by the amount administered until 06/04/20 0942.    Heparin (Porcine) in NaCl 1000-0.9 UT/500ML-% SOLN (mL) Total volume:  1,000 mL  Date/Time Rate/Dose/Volume Action   06/04/20 0834 500 mL Given   0834 500 mL Given    midazolam (VERSED) injection (mg) Total dose:  1 mg  Date/Time  Rate/Dose/Volume Action   06/04/20 0837 1 mg Given    fentaNYL (SUBLIMAZE) injection (mcg) Total dose:  25 mcg  Date/Time Rate/Dose/Volume Action   06/04/20 0837 25 mcg Given    lidocaine (PF) (XYLOCAINE) 1 % injection (mL) Total volume:  12 mL  Date/Time Rate/Dose/Volume Action   06/04/20 0844 12 mL Given    heparin sodium (porcine) injection (Units) Total dose:  10,000 Units  Date/Time Rate/Dose/Volume Action   06/04/20  0910 10,000 Units Given    iohexol (OMNIPAQUE) 350 MG/ML injection (mL) Total volume:  80 mL  Date/Time Rate/Dose/Volume Action   06/04/20 0930 80 mL Given    Sedation Time  Sedation Time Physician-1: 46 minutes 48 seconds   Radiation/Fluoro  Fluoro time: 10.5 (min) DAP: 22517 (mGycm2) Cumulative Air Kerma: 562 (mGy)   Complications   Complications documented before study signed (06/04/2020 10:03 AM)       Log Level Complications  None Documented by Burnell Blanks, MD 06/04/2020 9:44 AM  Date Found: 06/04/2020  Time Range: Intraprocedure     Coronary Findings   Diagnostic Dominance: Right  Left Main  Ost LM to LM lesion is 99% stenosed. The lesion is calcified.  Mid LM to Prox LAD lesion is 95% stenosed.  Left Anterior Descending  Vessel is large.  Prox LAD to Mid LAD lesion is 40% stenosed. The lesion is calcified.  Mid LAD lesion is 30% stenosed.  First Diagonal Branch  First Septal Branch  Vessel is small in size.  Second Diagonal Branch  Vessel is moderate in size.  Ost 2nd Diag to 2nd Diag lesion is 40% stenosed.  Second Septal Branch  Vessel is small in size.  Third Diagonal Branch  Vessel is small in size.  Third Septal Branch  Vessel is small in size.  Left Circumflex  Vessel is moderate in size.  Collaterals  Dist Cx filled by collaterals from Inf Sept.    Ost Cx to Prox Cx lesion is 100% stenosed. The lesion is chronically occluded.  Second Obtuse Marginal Branch  2nd Mrg lesion is 50% stenosed.   Right Coronary Artery  Prox RCA lesion is 80% stenosed. The lesion is calcified.  Mid RCA lesion is 30% stenosed. The lesion is calcified.  Dist RCA lesion is 50% stenosed.  Right Posterior Descending Artery  Ost RPDA to RPDA lesion is 90% stenosed. The lesion is calcified.  Graft To 2nd Mrg  Graft To 1st Diag  LIMA Graft To Dist LAD  Mid Graft to Insertion lesion is 100% stenosed. The lesion is chronically occluded.   Intervention   No interventions have been documented.  Coronary Diagrams   Diagnostic Dominance: Right    Intervention    Implants    No implant documentation for this case.    Syngo Images  Show images for CARDIAC CATHETERIZATION  Images on Long Term Storage  Show images for [place], [place] to Procedure Log  Procedure Log     Hemo Data  Flowsheet Row Most Recent Value  Fick Cardiac Output 5.03 L/min  Fick Cardiac Output Index 2.74 (L/min)/BSA  Aortic Mean Gradient 40.3 mmHg  Aortic Peak Gradient 55 mmHg  Aortic Valve Area 0.75  Aortic Value Area Index 0.41 cm2/BSA  RA A Wave 4 mmHg  RA V Wave 10 mmHg  RA Mean 7 mmHg  RV Systolic Pressure 58 mmHg  RV Diastolic Pressure 2 mmHg  RV EDP 8 mmHg  PA Systolic Pressure 52 mmHg  PA Diastolic Pressure 18 mmHg  PA Mean 31 mmHg  PW A Wave 8 mmHg  PW V Wave 4 mmHg  PW Mean 5 mmHg  AO Systolic Pressure 130 mmHg  AO Diastolic Pressure 54 mmHg  AO Mean 76 mmHg  LV Systolic Pressure 865 mmHg  LV Diastolic Pressure 5 mmHg  LV EDP 12 mmHg  AOp Systolic Pressure 784 mmHg  AOp Diastolic Pressure 57 mmHg  AOp Mean Pressure 83 mmHg  LVp Systolic  Pressure 176 mmHg  LVp Diastolic Pressure 6 mmHg  LVp EDP Pressure 13 mmHg  QP/QS 1  TPVR Index 11.31 HRUI  TSVR Index 27.73 HRUI  PVR SVR Ratio 0.38  TPVR/TSVR Ratio 0.41   PCI 06/11/20:   Ost RPDA to RPDA lesion is 90% stenosed.  Dist RCA lesion is 50% stenosed.  Mid RCA lesion is 30% stenosed.  Prox RCA lesion is 80%  stenosed.  Post intervention, there is a 0% residual stenosis.  A drug-eluting stent was successfully placed using a SYNERGY [id number].   1. Severe, heavily calcified stenosis in the proximal to mid RCA 2. Successful PTCA/orbital atherectomy/stenting proximal to mid RCA  Recommendations: Will continue DAPT with ASA and Plavix. Continue workup for TAVR   Indications  Coronary artery disease of native artery of native heart with stable angina pectoris ([place]) [O03.559 (ICD-10-CM)]   Procedural Details  Technical Details Indication: Severe RCA stenosis. Plans for PCI with orbital atherectomy/stenting of the RCA today.   Procedure: The risks, benefits, complications, treatment options, and expected outcomes were discussed with the patient. The patient and/or family concurred with the proposed plan, giving informed consent. The patient was brought to the cath lab after IV hydration was given. The patient was sedated with Versed and Fentanyl. The right wrist was prepped and draped in a sterile fashion. 1% lidocaine was used for local anesthesia. Using the modified Seldinger access technique, a 6 French sheath was placed in the right radial artery. 3 mg Verapamil was given through the sheath. 10,000 units IV heparin was given. I engaged the RCA with a JR4 guiding catheter. A Viper IC wire was advanced down the RCA. ACT over 300. I then made three passes with the CSI [facility] orbital atherectomy device. (2.5 passes on low speed and 0.5 pass on high speed). I then passed a Cougar IC wire down the RCA. A 2.5 x 12 mm balloon was used to dilate the mid vessel. OCT imaging performed. Vessel sizing and lesion length were obtained. A 3.0 x 38 mm Synergy DES was deployed in the mid to proximal RCA. I dilated the stent with a 3.25 x 22 mm [place] balloon x 1. I dilated the proximal stented segment with a 3.75 x 12 mm [place] balloon x 2.   The sheath was removed from the right radial artery and a Terumo hemostasis  band was applied at the arteriotomy site on the right wrist.    Estimated blood loss <50 mL.   During this procedure medications were administered to achieve and maintain moderate conscious sedation while the patient's heart rate, blood pressure, and oxygen saturation were continuously monitored and I was present face-to-face 100% of this time.   Medications (Filter: Administrations occurring from 0910 to 1100 on 06/11/20)  fentaNYL (SUBLIMAZE) injection (mcg) Total dose:  75 mcg  Date/Time Rate/Dose/Volume Action   06/11/20 0941 50 mcg Given   1014 25 mcg Given    midazolam (VERSED) injection (mg) Total dose:  3 mg  Date/Time Rate/Dose/Volume Action   06/11/20 0941 2 mg Given   1014 1 mg Given    lidocaine (PF) (XYLOCAINE) 1 % injection (mL) Total volume:  2 mL  Date/Time Rate/Dose/Volume Action   06/11/20 0942 2 mL Given    Radial Cocktail/Verapamil only (mL) Total volume:  10 mL  Date/Time Rate/Dose/Volume Action   06/11/20 0942 10 mL Given    Heparin (Porcine) in NaCl 1000-0.9 UT/500ML-% SOLN (mL) Total volume:  1,000 mL  Date/Time Rate/Dose/Volume Action  06/11/20 0942 500 mL Given   0942 500 mL Given    aminophylline 379 mg in sodium chloride 0.9 % 50 mL IV bolus (mg/kg) Total dose:  379 mg Dosing weight:  75.8  Date/Time Rate/Dose/Volume Action   06/11/20 0943 379 mg - 300 mL/hr New Bag/Given   0954 (over 10 min) Stopped    heparin sodium (porcine) injection (Units) Total dose:  10,000 Units  Date/Time Rate/Dose/Volume Action   06/11/20 0944 10,000 Units Given    sodium chloride 0.9 % 1,000 mL with ViperSlide Lubricant 1 each (mL) Total volume:  50 mL  Date/Time Rate/Dose/Volume Action   06/11/20 1004 50 mL Given    iohexol (OMNIPAQUE) 350 MG/ML injection (mL) Total volume:  110 mL  Date/Time Rate/Dose/Volume Action   06/11/20 1052 110 mL Given    aminophylline 381 mg in sodium chloride 0.9 % 50 mL IV bolus (mg/kg) Total dose:  0 mg Dosing  weight:  76.2  Date/Time Rate/Dose/Volume Action   06/11/20 0954 *0 mg - 0 mL/hr (over 10 min) Duplicate     Sedation Time  Sedation Time Physician-1: 1 hour 8 minutes 51 seconds   Contrast  Medication Name Total Dose  iohexol (OMNIPAQUE) 350 MG/ML injection 110 mL    Radiation/Fluoro  Fluoro time: 17.7 (min) DAP: 50354 (mGycm2) Cumulative Air Kerma: 656 (mGy)   Complications   Complications documented before study signed (06/12/2020 6:54 AM)       Log Level Complications  None Documented by Burnell Blanks, MD 06/11/2020 11:12 AM  Date Found: 06/11/2020  Time Range: Intraprocedure     Coronary Findings   Diagnostic Dominance: Right  Right Coronary Artery  Prox RCA lesion is 80% stenosed. The lesion is calcified.  Mid RCA lesion is 30% stenosed. The lesion is calcified.  Dist RCA lesion is 50% stenosed.  Right Posterior Descending Artery  Ost RPDA to RPDA lesion is 90% stenosed. The lesion is calcified.   Intervention   Prox RCA lesion  Stent  CATH [place]ISTA GUIDE 6FR JR4 guide catheter was inserted. Lesion crossed with guidewire using a WIRE VIPERWIRE COR FLEX .012. Pre-stent angioplasty was performed using a BALLOON SAPPHIRE 2.5X12. A drug-eluting stent was successfully placed using a SYNERGY XD 3.0X38. Stent strut is well apposed. Post-stent angioplasty was performed using a BALLOON SAPPHIRE [place] [id number].  Post-Intervention Lesion Assessment  The intervention was successful. Pre-interventional TIMI flow is 3. Post-intervention TIMI flow is 3. No complications occurred at this lesion.  There is a 0% residual stenosis post intervention.   Coronary Diagrams   Diagnostic Dominance: Right    Intervention     Implants    Permanent Stent   Synergy Xd 3.0x38 - [id number] - Implanted  Inventory item: SYNERGY XD 3.0X38 Model/Cat number: [id number]  Manufacturer: Wilkie Aye Lot number: [id number]  Device identifier: [id number] Device  identifier type: GS1   GUDID Information  Request status Successful    Brand name: [place] Rote Version/Model: [id number]  Company name: [employer] MRI safety info as of 06/11/20: MR Conditional  Contains dry or latex rubber: No    GMDN P.T. name: Drug-eluting coronary artery stent, non-bioabsorbable-polymer-coated     As of 06/11/2020  Status: Implanted        Syngo Images  Show images for CARDIAC CATHETERIZATION  Images on Long Term Storage  Show images for Gilberto, Streck to Procedure Log  Procedure Log     Hemo Data  Flowsheet Row Most Recent Value  AO Systolic  Pressure 888 mmHg  AO Diastolic Pressure 62 mmHg  AO Mean 89 mmHg     Recent Labs: 10/28/2019: TSH 1.767 11/12/2019: B Natriuretic Peptide 337.0 11/13/2019: ALT 18 01/16/2020: Magnesium 2.1 06/09/2020: BUN 11; Creatinine, Ser 0.77; Hemoglobin 11.5; Platelets 225; Potassium 4.7; Sodium 140     Wt Readings from Last 3 Encounters:  06/17/20 170 lb (77.1 kg)  06/11/20 167 lb (75.8 kg)  06/04/20 168 lb (76.2 kg)     Other studies Reviewed: Additional studies/ records that were reviewed today include: echo images, cath images, office notes, EKG Review of the above records demonstrates: severe AS  STS Risk Score: Procedure: Isolated AVR Risk of Mortality: 4.604% Renal Failure: 3.967% Permanent Stroke: 4.812% Prolonged Ventilation: 13.248% DSW Infection: 0.140% Reoperation: 3.774% Morbidity or Mortality: 20.673% Short Length of Stay: 32.123% Long Length of Stay: 7.589%  Assessment and Plan:   1. Severe Aortic Valve Stenosis: She has severe, stage D aortic valve stenosis. I have personally reviewed the echo images. The aortic valve is thickened, calcified with limited leaflet mobility. I think she would benefit from AVR. Given prior open heart surgery and COPD, she is not a good candidate for conventional AVR by surgical approach. I think she may be a good candidate  for TAVR. She is doing much better following the PCI of her RCA.   I have reviewed the natural history of aortic stenosis with the patient and their family members  who are present today. We have discussed the limitations of medical therapy and the poor prognosis associated with symptomatic aortic stenosis. We have reviewed potential treatment options, including palliative medical therapy, conventional surgical aortic valve replacement, and transcatheter aortic valve replacement. We discussed treatment options in the context of the patient's specific comorbid medical conditions.   She would like to continue with planning for TAVR. Risks and benefits of the valve procedure are reviewed with the patient. We will arrange a cardiac CT, CTA of the chest/abdomen and pelvis, carotid artery dopplers, PT assessment and she will then be referred to see one of the CT surgeons on our TAVR team.   BMET today.   2. CAD s/p CABG s/p recent PCI: no angina. Continue DAPT with ASA and Plavix.      Current medicines are reviewed at length with the patient today.  The patient does not have concerns regarding medicines.  The following changes have been made:  no change  Labs/ tests ordered today include:   Orders Placed This Encounter  Procedures  . Basic metabolic panel   Disposition:   F/U with the valve team.    Signed, Lauree Chandler, MD 06/17/2020 10:08 AM    [facility] [place], [place], [place]  [address] Phone: [phone]; Fax: [phone]

## 2020-06-17 ENCOUNTER — Encounter: Payer: Self-pay | Admitting: Cardiovascular Disease

## 2020-06-17 ENCOUNTER — Other Ambulatory Visit: Payer: Self-pay

## 2020-06-17 ENCOUNTER — Ambulatory Visit: Payer: Medicare HMO | Admitting: Cardiovascular Disease

## 2020-06-17 VITALS — BP 122/60 | HR 80 | Ht 65.0 in | Wt 170.0 lb

## 2020-06-17 DIAGNOSIS — I350 Nonrheumatic aortic (valve) stenosis: Secondary | ICD-10-CM

## 2020-06-17 DIAGNOSIS — I2510 Atherosclerotic heart disease of native coronary artery without angina pectoris: Secondary | ICD-10-CM

## 2020-06-17 DIAGNOSIS — Z01812 Encounter for preprocedural laboratory examination: Secondary | ICD-10-CM

## 2020-06-17 LAB — BASIC METABOLIC PANEL
BUN/Creatinine Ratio: 17 (ref 12–28)
BUN: 11 mg/dL (ref 8–27)
CO2: 25 mmol/L (ref 20–29)
Calcium: 10 mg/dL (ref 8.7–10.3)
Chloride: 99 mmol/L (ref 96–106)
Creatinine, Ser: 0.64 mg/dL (ref 0.57–1.00)
Glucose: 90 mg/dL (ref 65–99)
Potassium: 4.5 mmol/L (ref 3.5–5.2)
Sodium: 140 mmol/L (ref 134–144)
eGFR: 97 mL/min/{1.73_m2} (ref >59)

## 2020-06-17 NOTE — Patient Instructions (Signed)
Medication Instructions:  No changes *If you need a refill on your cardiac medications before your next appointment, please call your pharmacy*   Lab Work: Today: BMET  Testing/Procedures: Theodosia Quay, RN Structural Heart Nurse Navigator will contact you to arrange your CT scans and other testing.   Follow-Up: At [facility], you and your health needs are our priority.  As part of our continuing mission to provide you with exceptional heart care, we have created designated Provider Care Teams.  These Care Teams include your primary Cardiologist (physician) and Advanced Practice Providers (APPs -  Physician Assistants and Nurse Practitioners) who all work together to provide you with the care you need, when you need it.   Other Instructions

## 2020-06-18 ENCOUNTER — Other Ambulatory Visit: Payer: Self-pay

## 2020-06-18 DIAGNOSIS — I350 Nonrheumatic aortic (valve) stenosis: Secondary | ICD-10-CM

## 2020-06-24 ENCOUNTER — Ambulatory Visit (HOSPITAL_COMMUNITY)
Admission: RE | Admit: 2020-06-24 | Discharge: 2020-06-24 | Disposition: A | Discharge status: Home / Self Care | Payer: Medicare HMO | Source: Ambulatory Visit | Attending: Cardiovascular Disease | Admitting: Cardiovascular Disease

## 2020-06-24 ENCOUNTER — Ambulatory Visit (HOSPITAL_COMMUNITY): Payer: Medicare HMO

## 2020-06-24 ENCOUNTER — Other Ambulatory Visit: Payer: Self-pay

## 2020-06-24 ENCOUNTER — Encounter (HOSPITAL_COMMUNITY): Payer: Self-pay

## 2020-06-24 DIAGNOSIS — I350 Nonrheumatic aortic (valve) stenosis: Secondary | ICD-10-CM

## 2020-06-24 DIAGNOSIS — J449 Chronic obstructive pulmonary disease, unspecified: Secondary | ICD-10-CM | POA: Diagnosis not present

## 2020-06-24 DIAGNOSIS — K449 Diaphragmatic hernia without obstruction or gangrene: Secondary | ICD-10-CM | POA: Diagnosis not present

## 2020-06-24 DIAGNOSIS — K5730 Diverticulosis of large intestine without perforation or abscess without bleeding: Secondary | ICD-10-CM | POA: Diagnosis not present

## 2020-06-24 DIAGNOSIS — I517 Cardiomegaly: Secondary | ICD-10-CM | POA: Diagnosis not present

## 2020-06-24 MED ORDER — IOHEXOL 350 MG/ML SOLN
100.0000 mL | Freq: Once | INTRAVENOUS | Status: AC | PRN
  Administered 2020-06-24: 100 mL via INTRAVENOUS

## 2020-06-24 NOTE — Progress Notes (Signed)
Carotid duplex bilateral study completed.   Please see CV Proc for preliminary results.   Nitzia Perren, RDMS, RVT  

## 2020-07-16 ENCOUNTER — Encounter: Payer: Self-pay | Admitting: Surgery

## 2020-07-16 ENCOUNTER — Ambulatory Visit: Payer: Medicare HMO | Attending: Cardiovascular Disease | Admitting: Physical Therapy

## 2020-07-16 ENCOUNTER — Other Ambulatory Visit: Payer: Self-pay

## 2020-07-16 ENCOUNTER — Encounter: Payer: Self-pay | Admitting: Physical Therapy

## 2020-07-16 ENCOUNTER — Institutional Professional Consult (permissible substitution): Payer: Medicare HMO | Admitting: Surgery

## 2020-07-16 VITALS — BP 129/65 | HR 86 | Resp 20 | Ht 65.0 in | Wt 167.0 lb

## 2020-07-16 DIAGNOSIS — I350 Nonrheumatic aortic (valve) stenosis: Secondary | ICD-10-CM | POA: Diagnosis not present

## 2020-07-16 DIAGNOSIS — Z951 Presence of aortocoronary bypass graft: Secondary | ICD-10-CM | POA: Diagnosis not present

## 2020-07-16 DIAGNOSIS — R293 Abnormal posture: Secondary | ICD-10-CM

## 2020-07-16 DIAGNOSIS — Z85118 Personal history of other malignant neoplasm of bronchus and lung: Secondary | ICD-10-CM | POA: Diagnosis not present

## 2020-07-16 NOTE — Therapy (Signed)
[place], [place], [address] Phone: [phone]   Fax:  [phone]  Physical Therapy Evaluation  Patient Details  Name: Emily Rocha MRN: [id number] Date of Birth: 02-07-1956 Referring Provider (PT): Burnell Blanks, MD   Encounter Date: 07/16/2020   PT End of Session - 07/16/20 0917    Visit Number 1    Number of Visits 1    Date for PT Re-Evaluation 07/17/20    PT Start Time 0921    PT Stop Time 0952    PT Time Calculation (min) 31 min    Activity Tolerance Patient tolerated treatment well    Behavior During Therapy [facility] for tasks assessed/performed           Past Medical History:  Diagnosis Date  . Anxiety   . Asthma   . COPD (chronic obstructive pulmonary disease) ([place])   . Coronary artery disease    hx CABG  . GERD (gastroesophageal reflux disease)   . Peripheral vascular disease ([place])   . Pneumonia   . Severe aortic stenosis   . Stroke [facility])    "mini stroke" numbness and weakness to left side of the body    Past Surgical History:  Procedure Laterality Date  . AORTIC ARCH ANGIOGRAPHY N/A 11/04/2017   Procedure: AORTIC ARCH ANGIOGRAPHY;  Surgeon: Elam Dutch, MD;  Location: [place] [facility];  Service: Cardiovascular;  Laterality: N/A;  . APPLICATION OF WOUND VAC N/A 01/06/2018   Procedure: APPLICATION OF WOUND VAC;  Surgeon: Ivin Poot, MD;  Location: [place];  Service: Thoracic;  Laterality: N/A;  . APPLICATION OF WOUND VAC N/A 01/13/2018   Procedure: WOUND VAC CHANGE;  Surgeon: Ivin Poot, MD;  Location: [place];  Service: Thoracic;  Laterality: N/A;  . CARDIAC CATHETERIZATION    . CAROTID-SUBCLAVIAN BYPASS GRAFT Right 12/26/2017   Procedure: AORTIC TO RIGHT COMMON CAROTID AND RIGHT SUBCLAVIAN  ARTERY  BYPASS;  Surgeon: Elam Dutch, MD;  Location: [place];  Service: Vascular;  Laterality: Right;  . CHOLECYSTECTOMY    . CLOSURE OF DIAPHRAGM  12/26/2017    Procedure: REPAIR OF DIAPHRAGM;  Surgeon: Ivin Poot, MD;  Location: [place];  Service: Thoracic;;  . COLONOSCOPY N/A 10/03/2014   Procedure: COLONOSCOPY;  Surgeon: Rogene Houston, MD;  [facility];  Service: Endoscopy;  Laterality: N/A;  730  . CORONARY ARTERY BYPASS GRAFT N/A 12/14/2016   Procedure: CORONARY ARTERY BYPASS GRAFTING (CABG) x three , using left internal mammary artery and right leg greater saphenous vein harvested endoscopically;  Surgeon: Ivin Poot, MD;  Location: [place];  Service: Open Heart Surgery;  Laterality: N/A;  . CORONARY ATHERECTOMY N/A 06/11/2020   Procedure: CORONARY ATHERECTOMY;  Surgeon: Burnell Blanks, MD;  Location: [place] [facility];  Service: Cardiovascular;  Laterality: N/A;  . CORONARY STENT INTERVENTION N/A 06/11/2020   Procedure: CORONARY STENT INTERVENTION;  Surgeon: Burnell Blanks, MD;  Location: [place] [facility];  Service: Cardiovascular;  Laterality: N/A;  . ENDARTERECTOMY Right 12/06/2014   Procedure: ENDARTERECTOMY CAROTid;  Surgeon: Elam Dutch, MD;  Location: [facility] OR;  Service: Vascular;  Laterality: Right;  . ESOPHAGOGASTRODUODENOSCOPY N/A 12/03/2016   Procedure: ESOPHAGOGASTRODUODENOSCOPY (EGD);  Surgeon: Rogene Houston, MD;  [facility];  Service: Endoscopy;  Laterality: N/A;  7:30  . INTRAVASCULAR IMAGING/OCT N/A 06/11/2020   Procedure: INTRAVASCULAR IMAGING/OCT;  Surgeon: Burnell Blanks, MD;  Location: [place] [facility]  [facility];  Service: Cardiovascular;  Laterality: N/A;  . INTRAVASCULAR PRESSURE WIRE/FFR STUDY N/A 06/04/2020   Procedure: INTRAVASCULAR PRESSURE WIRE/FFR STUDY;  Surgeon: Burnell Blanks, MD;  Location: [place] [facility];  Service: Cardiovascular;  Laterality: N/A;  . LEFT HEART CATH AND CORONARY ANGIOGRAPHY N/A 12/14/2016   Procedure: LEFT HEART CATH AND CORONARY ANGIOGRAPHY;  Surgeon: Burnell Blanks, MD;  Location: [place] [facility];  Service:  Cardiovascular;  Laterality: N/A;  . LOBECTOMY Right 10/26/2018   Procedure: RIGHT UPPER LOBECTOMY;  Surgeon: Lajuana Matte, MD;  Location: [place];  Service: Thoracic;  Laterality: Right;  . PERIPHERAL VASCULAR CATHETERIZATION N/A 12/05/2014   Procedure:  Carotid Arch Angiography;  Surgeon: Conrad Burgaw, MD;  Location: [place] [facility];  Service: Cardiovascular;  Laterality: N/A;  . RIGHT/LEFT HEART CATH AND CORONARY/GRAFT ANGIOGRAPHY N/A 06/04/2020   Procedure: RIGHT/LEFT HEART CATH AND CORONARY/GRAFT ANGIOGRAPHY;  Surgeon: Burnell Blanks, MD;  Location: [place] [facility];  Service: Cardiovascular;  Laterality: N/A;  . STERNAL WOUND DEBRIDEMENT N/A 01/06/2018   Procedure: STERNAL WOUND DEBRIDEMENT;  Surgeon: Ivin Poot, MD;  Location: [place];  Service: Thoracic;  Laterality: N/A;  . STERNOTOMY N/A 12/26/2017   Procedure: REDO STERNOTOMY;  Surgeon: Prescott Gum, Collier Salina, MD;  Location: [place];  Service: Thoracic;  Laterality: N/A;  . TEE WITHOUT CARDIOVERSION N/A 12/14/2016   Procedure: TRANSESOPHAGEAL ECHOCARDIOGRAM (TEE);  Surgeon: Prescott Gum, Collier Salina, MD;  Location: [place];  Service: Open Heart Surgery;  Laterality: N/A;  . VIDEO ASSISTED THORACOSCOPY (VATS)/WEDGE RESECTION Right 10/26/2018   Procedure: VIDEO ASSISTED THORACOSCOPY (VATS)/WEDGE RESECTION;  Surgeon: Lajuana Matte, MD;  Location: [place];  Service: Thoracic;  Laterality: Right;  Marland Kitchen VIDEO BRONCHOSCOPY N/A 10/26/2018   Procedure: VIDEO BRONCHOSCOPY;  Surgeon: Lajuana Matte, MD;  Location: [place];  Service: Thoracic;  Laterality: N/A;    There were no vitals filed for this visit.    Subjective Assessment - 07/16/20 0929    Subjective pt is a 65 y.o F with extensive Cardiac hx noting her CC of SOB that has been going on for a while, She did get a stent placed 6 months ago and notes 50% improvement but continues to have SOB with prolonged activities. she reports having some chest pain that is intermittent.     Patient Stated Goals to fix heart.    Currently in Pain? No/denies              [facility] PT Assessment - 07/16/20 0001      Assessment   Medical Diagnosis Severe Aortic stenosis    Referring Provider (PT) Burnell Blanks, MD    Hand Dominance Right      Precautions   Precautions None      Restrictions   Weight Bearing Restrictions No      Balance Screen   Has the patient fallen in the past 6 months No      [place] residence    Living Arrangements Spouse/significant other    Available Help at Discharge Family    Type of [place] Access Level entry    [place] None      Prior Function   Level of [place] with basic ADLs      Posture/Postural Control   Posture/Postural Control Postural limitations    Postural Limitations Rounded Shoulders;Forward head      ROM /  Strength   AROM / PROM / Strength AROM;Strength      AROM   Overall AROM  Within functional limits for tasks performed      Strength   Overall Strength Within functional limits for tasks performed    Strength Assessment Site Hand    Right/Left hand Right;Left    Right Hand Grip (lbs) 44    Left Hand Grip (lbs) 31      Ambulation/Gait   Gait Comments pt amb 815 ft in 3:50 requiring standing rest break lasting 35 seconds HR was 84 and O2 was 84%. She resumed walking and additional 325 ft finishing test.            [facility] Pre-Surgical Assessment - 07/16/20 0001    5 Meter Walk Test- trial 1 4 sec    5 Meter Walk Test- trial 2 4 sec.     5 Meter Walk Test- trial 3 4 sec.    5 meter walk test average 4 sec    4 Stage Balance Test tolerated for:  10 sec.    4 Stage Balance Test Position 2    Sit To Stand Test- trial 1 14 sec.    ADL/IADL Independent with: Bathing;Dressing;Meal prep;Finances;Yard work    ADL/IADL Therapist, sports Index Vulnerable    6 Minute Walk- Baseline yes    BP (mmHg) 130/79    HR (bpm)  75    02 Sat (%RA) 94 %    Modified Borg Scale for Dyspnea 0.5- Very, very slight shortness of breath    Perceived Rate of Exertion (Borg) 7- Very, very light    6 Minute Walk Post Test yes    BP (mmHg) 161/73    HR (bpm) 84    02 Sat (%RA) 84 %    Modified Borg Scale for Dyspnea 3- Moderate shortness of breath or breathing difficulty    Perceived Rate of Exertion (Borg) 13- Somewhat hard    Aerobic Endurance Distance Walked 1114    Endurance additional comments pt is 36.88% limited compared to age related norm                    Objective measurements completed on examination: See above findings.                            Plan - 07/16/20 0917    Clinical Impression Statement see assessment in note    Stability/Clinical Decision Making Stable/Uncomplicated    Clinical Decision Making Low    PT Frequency One time visit    PT Next Visit Plan Pre TAVR evaluation           Clinical Impression Statement: Pt is a 65 yo F presenting to OP PT for evaluation prior to possible TAVR surgery due to severe aortic stenosis. Pt reports onset of SOB for over 6 months and intermittent chest pain approximately 2-3 months ago. Symptoms are limiting prolonged walking. Pt presents with good ROM and strength, limited balance and is assessed as moderate at high fall risk 4 stage balance test, good walking speed and fair aerobic endurance per 6 minute walk test. pt amb 815 ft in 3:50 requiring standing rest break lasting 35 seconds HR was 84 and O2 was 84%. She resumed walking and additional 325 ft finishing test on room air. Pt reported 3/10 shortness of breath on modified scale for dyspnea. Pt ambulated a total of 1114 feet in 6 minute walk. SOB  increased significantly with 6 minute walk test. Based on the Short Physical Performance Battery, patient has a frailty rating of 7/12 with </= 5/12 considered frail.    Patient demonstrated  the following deficits and  impairments:     Visit Diagnosis: Abnormal posture     Problem List Patient Active Problem List   Diagnosis Date Noted  . CAD S/P percutaneous coronary angioplasty 06/11/2020  . Severe aortic stenosis 05/19/2020  . Chronic respiratory failure with hypoxia ([place]) 11/13/2019  . Chronic diastolic CHF (congestive heart failure) ([place]) 11/13/2019  . Syncope 11/13/2019  . Subdural hematoma ([place]) 11/12/2019  . Recurrent falls 11/12/2019  . Diastolic CHF, chronic ([place]) 11/12/2019  . Lip laceration 11/12/2019  . Hypoalbuminemia 11/12/2019  . Shoulder pain 11/05/2019  . Aspiration pneumonia ([place]) 11/02/2019  . Hiatal hernia 11/02/2019  . Thrombus 11/02/2019  . Hypokalemia 11/02/2019  . Acute metabolic encephalopathy   . Acute respiratory failure with hypoxia ([place]) 10/24/2019  . Rt Sided Pneumonia with loculated parapneumonic effusion 10/24/2019  . Rt Sided ParaPneumonic Pleural effusion 10/24/2019  . History of lung cancer- Non-Small cell/S/p Prior VATs 10/24/2019  . CAD (coronary artery disease)/Prior CABG 10/24/2019  . Acute respiratory failure ([place]) 10/24/2019  . Malignant neoplasm of right upper lobe of lung ([place]) 11/27/2018  . S/P lobectomy of lung 10/26/2018  . Nodule of upper lobe of right lung 09/13/2018  . Wound of sternal region 01/16/2018  . DVT (deep venous thrombosis) ([place]) 01/16/2018  . TIA due to embolism ([place]) 12/26/2017  . Acute diastolic heart failure ([place])   . Unstable angina ([place]) 12/14/2016  . S/P CABG x 3 12/14/2016  . Chest pain 12/12/2016  . NSTEMI (non-ST elevated myocardial infarction) ([place]) 12/12/2016  . COPD exacerbation ([place]) 12/12/2016  . GERD (gastroesophageal reflux disease) 12/12/2016  . CAP (community acquired pneumonia) 12/12/2016  . Abdominal pain, epigastric 11/30/2016  . Absolute anemia 11/30/2016  . Carotid stenosis 12/19/2014  . Aftercare following surgery of the circulatory system 12/19/2014  . Carotid artery stenosis, symptomatic  12/06/2014  . Symptomatic carotid artery stenosis 12/04/2014   [place] PT, DPT, LAT, ATC  07/16/20  9:59 AM      [facility] [address] [place], [place], [address] Phone: [phone]   Fax:  [phone]  Name: SUVI ARCHULETTA MRN: [id number] Date of Birth: September 23, 1955

## 2020-07-16 NOTE — Progress Notes (Signed)
Patient ID: Emily Rocha, female   DOB: [birth year], 65 y.o.   MRN: [id number]  [place] SURGERY CONSULTATION REPORT  Referring Provider is Emily Rocha, Emily Guild, MD Primary Cardiologist is Emily Dolly, MD PCP is Emily Chroman, MD  Chief Complaint  Patient presents with  . Aortic Stenosis    Surgical consult for TAVR, review all testing     HPI:  The patient is a 65 year old woman who has had a long complicated medical history with coronary disease status post CABG x3 by Dr. Lucianne Lei Rocha in 2018, redo sternotomy with aorto to right common carotid artery and right subclavian artery bypasses by Dr. Lucianne Lei Rocha and Dr. Oneida Rocha on 12/26/2017 for high-grade stenosis of the innominate artery, superficial sternal wound infection requiring wound debridement and wound VAC therapy on 01/06/2018 and 01/13/2018.  She was subsequently diagnosed with a right upper lobe pulmonary nodule and underwent VATS right upper lobectomy by Dr. Kipp Rocha on 10/26/2018.  She also has a history of prior smoking and COPD, prior stroke, and severe aortic stenosis.  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  She now presents with progressive exertional dyspnea and fatigue.  Her most recent echocardiogram on 04/28/2020 showed a trileaflet, thickened and calcified aortic valve with restricted leaflet mobility.  The mean gradient was 43 mmHg and the peak 68 mmHg.  Dimensionless index was 0.23 consistent with severe aortic stenosis.  Left ventricular systolic function was low normal with ejection fraction of 55 to 60%.  Her gradients had increased compared to her previous echo in September 2021.  Cardiac catheterization on 06/04/2020 showed severe left main stenosis.  The diagonal and LAD filled from a patent vein graft to the diagonal Emily Rocha.  The LIMA graft was atraumatic.  The chronically  occluded left circumflex filled from a patent vein graft.  The RCA had a severe heavily calcific stenosis.  The mean gradient across aortic valve was measured at 40.3 mmHg with a peak to peak gradient of 55 mmHg and aortic valve area of 0.75 cm.  He subsequently underwent staged PCI of the RCA with orbital atherectomy and DES on 06/12/2020 and said that she is felt much better since then with a decrease in her shortness of breath and fatigue.  She is here today by herself but lives with her husband at home.  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  She no longer smokes.  She has full dentures.  Past Medical History:  Diagnosis Date  . Anxiety   . Asthma   . COPD (chronic obstructive pulmonary disease) ([place])   . Coronary artery disease    hx CABG  . GERD (gastroesophageal reflux disease)   . Peripheral vascular disease ([place])   . Pneumonia   . Severe aortic stenosis   . Stroke [facility])    "mini stroke" numbness and weakness to left side of the body    Past Surgical History:  Procedure Laterality Date  . AORTIC ARCH ANGIOGRAPHY N/A 11/04/2017   Procedure: AORTIC ARCH ANGIOGRAPHY;  Surgeon: Emily Dutch, MD;  Location: [place] [facility];  Service: Cardiovascular;  Laterality: N/A;  . APPLICATION OF WOUND VAC N/A 01/06/2018   Procedure: APPLICATION OF WOUND VAC;  Surgeon: Emily Poot, MD;  Location: [place];  Service: Thoracic;  Laterality: N/A;  . APPLICATION OF WOUND  VAC N/A 01/13/2018   Procedure: WOUND VAC CHANGE;  Surgeon: Emily Poot, MD;  Location: [place];  Service: Thoracic;  Laterality: N/A;  . CARDIAC CATHETERIZATION    . CAROTID-SUBCLAVIAN BYPASS GRAFT Right 12/26/2017   Procedure: AORTIC TO RIGHT COMMON CAROTID AND RIGHT SUBCLAVIAN  ARTERY  BYPASS;  Surgeon: Emily Dutch, MD;  Location: [place];  Service: Vascular;  Laterality: Right;  . CHOLECYSTECTOMY    . CLOSURE OF DIAPHRAGM  12/26/2017   Procedure: REPAIR OF DIAPHRAGM;  Surgeon:  Emily Poot, MD;  Location: [place];  Service: Thoracic;;  . COLONOSCOPY N/A 10/03/2014   Procedure: COLONOSCOPY;  Surgeon: Emily Houston, MD;  [facility];  Service: Endoscopy;  Laterality: N/A;  730  . CORONARY ARTERY BYPASS GRAFT N/A 12/14/2016   Procedure: CORONARY ARTERY BYPASS GRAFTING (CABG) x three , using left internal mammary artery and right leg greater saphenous vein harvested endoscopically;  Surgeon: Emily Poot, MD;  Location: [place];  Service: Open Heart Surgery;  Laterality: N/A;  . CORONARY ATHERECTOMY N/A 06/11/2020   Procedure: CORONARY ATHERECTOMY;  Surgeon: Emily Blanks, MD;  Location: [place] [facility];  Service: Cardiovascular;  Laterality: N/A;  . CORONARY STENT INTERVENTION N/A 06/11/2020   Procedure: CORONARY STENT INTERVENTION;  Surgeon: Emily Blanks, MD;  Location: [place] [facility];  Service: Cardiovascular;  Laterality: N/A;  . ENDARTERECTOMY Right 12/06/2014   Procedure: ENDARTERECTOMY CAROTid;  Surgeon: Emily Dutch, MD;  Location: [facility];  Service: Vascular;  Laterality: Right;  . ESOPHAGOGASTRODUODENOSCOPY N/A 12/03/2016   Procedure: ESOPHAGOGASTRODUODENOSCOPY (EGD);  Surgeon: Emily Houston, MD;  [facility];  Service: Endoscopy;  Laterality: N/A;  7:30  . INTRAVASCULAR IMAGING/OCT N/A 06/11/2020   Procedure: INTRAVASCULAR IMAGING/OCT;  Surgeon: Emily Blanks, MD;  Location: [place] [facility];  Service: Cardiovascular;  Laterality: N/A;  . INTRAVASCULAR PRESSURE WIRE/FFR STUDY N/A 06/04/2020   Procedure: INTRAVASCULAR PRESSURE WIRE/FFR STUDY;  Surgeon: Emily Blanks, MD;  Location: [place] [facility];  Service: Cardiovascular;  Laterality: N/A;  . LEFT HEART CATH AND CORONARY ANGIOGRAPHY N/A 12/14/2016   Procedure: LEFT HEART CATH AND CORONARY ANGIOGRAPHY;  Surgeon: Emily Blanks, MD;  Location: [place] [facility];  Service: Cardiovascular;  Laterality: N/A;  . LOBECTOMY  Right 10/26/2018   Procedure: RIGHT UPPER LOBECTOMY;  Surgeon: Emily Matte, MD;  Location: [place];  Service: Thoracic;  Laterality: Right;  . PERIPHERAL VASCULAR CATHETERIZATION N/A 12/05/2014   Procedure:  Carotid Arch Angiography;  Surgeon: Conrad Haynes, MD;  Location: [place] [facility];  Service: Cardiovascular;  Laterality: N/A;  . RIGHT/LEFT HEART CATH AND CORONARY/GRAFT ANGIOGRAPHY N/A 06/04/2020   Procedure: RIGHT/LEFT HEART CATH AND CORONARY/GRAFT ANGIOGRAPHY;  Surgeon: Emily Blanks, MD;  Location: [place] [facility];  Service: Cardiovascular;  Laterality: N/A;  . STERNAL WOUND DEBRIDEMENT N/A 01/06/2018   Procedure: STERNAL WOUND DEBRIDEMENT;  Surgeon: Emily Poot, MD;  Location: [place];  Service: Thoracic;  Laterality: N/A;  . STERNOTOMY N/A 12/26/2017   Procedure: REDO STERNOTOMY;  Surgeon: Prescott Gum, Collier Salina, MD;  Location: [place];  Service: Thoracic;  Laterality: N/A;  . TEE WITHOUT CARDIOVERSION N/A 12/14/2016   Procedure: TRANSESOPHAGEAL ECHOCARDIOGRAM (TEE);  Surgeon: Prescott Gum, Collier Salina, MD;  Location: [place];  Service: Open Heart Surgery;  Laterality: N/A;  . VIDEO ASSISTED THORACOSCOPY (VATS)/WEDGE RESECTION Right 10/26/2018   Procedure: VIDEO ASSISTED THORACOSCOPY (VATS)/WEDGE RESECTION;  Surgeon: Emily Matte, MD;  [facility];  Service: Thoracic;  Laterality: Right;  Marland Kitchen VIDEO BRONCHOSCOPY N/A 10/26/2018   Procedure: VIDEO BRONCHOSCOPY;  Surgeon: Emily Matte, MD;  Location: [facility];  Service: Thoracic;  Laterality: N/A;    Family History  Problem Relation Age of Onset  . Heart attack Mother   . Cancer Mother   . Diabetes Mother   . Heart disease Mother   . Heart attack Father   . Heart disease Father     Social History   Socioeconomic History  . Marital status: Married    Spouse name: Not on file  . Number of children: Not on file  . Years of education: Not on file  . Highest education level: Not on file  Occupational History  .  Not on file  Tobacco Use  . Smoking status: Former Smoker    Packs/day: 2.00    Years: 40.00    Pack years: 80.00    Quit date: 10/24/2015    Years since quitting: 4.7  . Smokeless tobacco: Never Used  Vaping Use  . Vaping Use: Former  . Start date: 10/23/2013  . Quit date: 10/24/2015  Substance and Sexual Activity  . Alcohol use: No    Alcohol/week: 0.0 standard drinks  . Drug use: No  . Sexual activity: Not on file  Other Topics Concern  . Not on file  Social History Narrative  . Not on file   Social Determinants of Health   Financial Resource Strain: Not on file  Food Insecurity: Not on file  Transportation Needs: Not on file  Physical Activity: Not on file  Stress: Not on file  Social Connections: Not on file  Intimate Partner Violence: Not on file    Current Outpatient Medications  Medication Sig Dispense Refill  . albuterol (PROVENTIL) (2.5 MG/3ML) 0.083% nebulizer solution Take 2.5 mg by nebulization every 4 (four) hours as needed for wheezing or shortness of breath.    Marland Kitchen albuterol (VENTOLIN HFA) 108 (90 Base) MCG/ACT inhaler Inhale 1-2 puffs into the lungs every 6 (six) hours as needed for wheezing or shortness of breath.    . ALPRAZolam (XANAX) 0.5 MG tablet Take 0.5 mg by mouth 3 (three) times daily.    . Ascorbic Acid (VITAMIN C GUMMIE) 120 MG CHEW Chew 500 mg by mouth daily.    Marland Kitchen aspirin EC 81 MG tablet Take 1 tablet (81 mg total) by mouth daily. Swallow whole. 90 tablet 3  . atorvastatin (LIPITOR) 40 MG tablet Take 1 tablet (40 mg total) daily by mouth. 30 tablet 1  . cholecalciferol (VITAMIN D3) 25 MCG (1000 UNIT) tablet Take 1,000 Units by mouth in the morning.    . clopidogrel (PLAVIX) 75 MG tablet Take 1 tablet (75 mg total) by mouth daily. 90 tablet 1  . diphenhydramine-acetaminophen (TYLENOL PM) 25-500 MG TABS tablet Take 2-3 tablets by mouth at bedtime.    . Multiple Vitamin (MULTIVITAMIN WITH MINERALS) TABS tablet Take 1 tablet by mouth daily.    .  pantoprazole (PROTONIX) 40 MG tablet Take 40 mg by mouth in the morning.    . potassium chloride SA (KLOR-CON) 20 MEQ tablet Take 20 mEq by mouth in the morning.    . Tiotropium Bromide-Olodaterol (STIOLTO RESPIMAT) 2.5-2.5 MCG/ACT AERS Inhale 2 puffs into the lungs daily. 1 each 4  . torsemide (DEMADEX) 20 MG tablet Take 40 mg by mouth See admin instructions. Take 2 tablets (40 mg) by mouth scheduled on Mondays, Tuesdays, Wednesdays, Thursdays, Fridays &  Saturdays. (Hold dose on Sundays)    . isosorbide mononitrate (IMDUR) 30 MG 24 hr tablet Take 0.5 tablets (15 mg total) by mouth daily. 45 tablet 3  . metoprolol succinate (TOPROL-XL) 50 MG 24 hr tablet Take 1 tablet (50 mg total) by mouth daily. Take with or immediately following a meal. 90 tablet 3  . predniSONE (DELTASONE) 10 MG tablet Take 10 mg by mouth 2 (two) times daily.     Current Facility-Administered Medications  Medication Dose Route Frequency Provider Last Rate Last Admin  . sodium chloride flush (NS) 0.9 % injection 3 mL  3 mL Intravenous Q12H Emily Rocha, Emily Guild, MD        No Known Allergies    Review of Systems:   General:  normal appetite, + decreased energy, no weight gain, no weight loss, no fever  Cardiac:  no chest pain with exertion, no chest pain at rest, +SOB with exertion, no resting SOB, no PND, no orthopnea, no palpitations, no arrhythmia, no atrial fibrillation, no LE edema, no dizzy spells, no syncope  Respiratory:  + exertional shortness of breath, no home oxygen, + productive cough, + dry cough, no bronchitis, + wheezing, no hemoptysis, no asthma, no pain with inspiration or cough, no sleep apnea, no CPAP at night  GI:   no difficulty swallowing, no reflux, no frequent heartburn, + hiatal hernia, no abdominal pain, no constipation, no diarrhea, no hematochezia, no hematemesis, no melena  GU:   no dysuria,  no frequency, no urinary tract infection, no hematuria,  no kidney stones, no kidney  disease  Vascular:  no pain suggestive of claudication, no pain in feet, no leg cramps, no varicose veins, no DVT, no non-healing foot ulcer  Neuro:   + remote stroke, no TIA's, no seizures, no headaches, no temporary blindness one eye,  no slurred speech, no peripheral neuropathy, no chronic pain, no instability of gait, no memory/cognitive dysfunction  Musculoskeletal: no arthritis, no joint swelling, no myalgias, no difficulty walking, normal mobility   Skin:   no rash, no itching, no skin infections, no pressure sores or ulcerations  Psych:   + anxiety, no depression, no nervousness, no unusual recent stress  Eyes:   no blurry vision, + floaters, no recent vision changes, does not wear glasses or contacts  ENT:   + hearing loss, no loose or painful teeth, + dentures  Hematologic:  + easy bruising, no abnormal bleeding, no clotting disorder, no frequent epistaxis  Endocrine:  no diabetes, does not check CBG's at home      Physical Exam:   BP 129/65   Pulse 86   Resp 20   Ht 5\' 5"  (1.651 m)   Wt 167 lb (75.8 kg)   SpO2 95% Comment: RA  BMI 27.79 kg/m   General:  well-appearing  HEENT:  Unremarkable, [place]/AT, PERLA, EOMI  Neck:   no JVD, no bruits, no adenopathy   Chest:   clear to auscultation, symmetrical breath sounds, no wheezes, no rhonchi   CV:   RRR, grade lll/VI crescendo/decrescendo murmur heard best at RSB,  no diastolic murmur  Abdomen:  soft, non-tender, no masses   Extremities:  warm, well-perfused, pulses palpable at ankle, no LE edema  Rectal/GU  Deferred  Neuro:   Grossly non-focal and symmetrical throughout  Skin:   Clean and dry, no rashes, no breakdown   Diagnostic Tests:  ECHOCARDIOGRAM REPORT       Patient Name:  ALONDRIA MOUSSEAU Date of Exam: 04/28/2020  Medical Rec #: [id number]  Height:    65.0 in  Accession #:  [id number]  Weight:    167.0 lb  Date of Birth: 14-Dec-1955  BSA:     1.832 m  Patient Age:  38 years   BP:       124/70 mmHg  Patient Gender: F      HR:      88 bpm.  Exam Location: [facility]   Procedure: 2D Echo   Indications:  Aortic stenosis I35.0    History:    Patient has prior history of Echocardiogram examinations,  most         recent 11/04/2019. CHF, CAD and Previous Myocardial  Infarction,         Prior CABG, COPD, Signs/Symptoms:Syncope and Chest Pain;  Risk         Factors:Former Smoker.         History of lung cancer- Non-Small cell/S/p Prior VATs.    Sonographer:  Leavy Cella RDCS (AE)  Referring Phys: [id number] [place]    1. Basal inferior akinesis. Left ventricular ejection fraction, by  estimation, is 55 to 60%. The left ventricle has normal function. Left  ventricular diastolic parameters are indeterminate.  2. Right ventricular systolic function is low normal. The right  ventricular size is mildly enlarged.  3. Trivial mitral valve regurgitation.  4. AV is thickened, calcified with restricted motion. Peak and mean  gradients through the valve are 68 and 43 mm Hg respectively Dimensionless  index is 0.23 consistent with severe AS. Compared to echo from 9.19.21,  gradients are increased. The aortic  valve is tricuspid. Aortic valve regurgitation is not visualized.  5. The inferior vena cava is normal in size with greater than 50%  respiratory variability, suggesting right atrial pressure of 3 mmHg.   FINDINGS  Left Ventricle: Basal inferior akinesis. Left ventricular ejection  fraction, by estimation, is 55 to 60%. The left ventricle has normal  function. The left ventricular internal cavity size was normal in size.  There is no left ventricular hypertrophy.  Left ventricular diastolic parameters are indeterminate.   Right Ventricle: The right ventricular size is mildly enlarged. Right  vetricular wall thickness was not assessed. Right ventricular systolic  function is low  normal.   Left Atrium: Left atrial size was normal in size.   Right Atrium: Right atrial size was normal in size.   Pericardium: There is no evidence of pericardial effusion.   Mitral Valve: There is mild thickening of the mitral valve leaflet(s).  Mild to moderate mitral annular calcification. Trivial mitral valve  regurgitation.   Tricuspid Valve: The tricuspid valve is normal in structure. Tricuspid  valve regurgitation is mild.   Aortic Valve: AV is thickened, calcified with restricted motion. Peak and  mean gradients through the valve are 68 and 43 mm Hg respectively  Dimensionless index is 0.23 consistent with severe AS. Compared to echo  from 9.19.21, gradients are increased. The  aortic valve is tricuspid. Aortic valve regurgitation is not visualized.  Aortic valve mean gradient measures 43.0 mmHg. Aortic valve peak gradient  measures 68.2 mmHg. Aortic valve area, by VTI measures 0.64 cm.   Pulmonic Valve: The pulmonic valve was grossly normal. Pulmonic valve  regurgitation is not visualized.   Aorta: The aortic root is normal in size and structure.   Venous: The inferior vena cava is normal in size with greater than 50%  respiratory variability, suggesting right atrial pressure of 3  mmHg.   IAS/Shunts: The interatrial septum was not assessed.     LEFT VENTRICLE  PLAX 2D  LVIDd:     3.68 cm Diastology  LVIDs:     2.74 cm LV e' medial:  6.74 cm/s  LV PW:     1.13 cm LV E/e' medial: 15.9  LV IVS:    0.89 cm LV e' lateral:  10.80 cm/s  LVOT diam:   1.90 cm LV E/e' lateral: 9.9  LV SV:     61  LV SV Index:  34  LVOT Area:   2.84 cm     RIGHT VENTRICLE  RV S prime:   8.81 cm/s  TAPSE (M-mode): 1.1 cm   LEFT ATRIUM       Index    RIGHT ATRIUM      Index  LA diam:    4.20 cm 2.29 cm/m RA Area:   19.30 cm  LA Vol (A2C):  64.3 ml 35.09 ml/m RA Volume:  55.10 ml 30.07 ml/m  LA Vol (A4C):  60.1 ml  32.80 ml/m  LA Biplane Vol: 62.2 ml 33.95 ml/m  AORTIC VALVE  AV Area (Vmax):  0.58 cm  AV Area (Vmean):  0.54 cm  AV Area (VTI):   0.64 cm  AV Vmax:      413.00 cm/s  AV Vmean:     313.667 cm/s  AV VTI:      0.954 m  AV Peak Grad:   68.2 mmHg  AV Mean Grad:   43.0 mmHg  LVOT Vmax:     84.40 cm/s  LVOT Vmean:    59.967 cm/s  LVOT VTI:     0.217 m  LVOT/AV VTI ratio: 0.23    AORTA  Ao Root diam: 2.50 cm   MITRAL VALVE        TRICUSPID VALVE  MV Area (PHT): 3.46 cm   TR Peak grad:  31.8 mmHg  MV Decel Time: 219 msec   TR Vmax:    282.00 cm/s  MV E velocity: 107.00 cm/s  MV A velocity: 104.00 cm/s SHUNTS  MV E/A ratio: 1.03     Systemic VTI: 0.22 m               Systemic Diam: 1.90 cm   Emily Carnes MD  Electronically signed by Emily Carnes MD  Signature Date/Time: 04/28/2020/9:21:45 PM      Final     Physicians  Panel Physicians Referring Physician Case Authorizing Physician  Emily Blanks, MD (Primary)      Procedures  INTRAVASCULAR PRESSURE WIRE/FFR STUDY  RIGHT/LEFT HEART CATH AND CORONARY/GRAFT ANGIOGRAPHY   Conclusion    Ost LM to LM lesion is 99% stenosed.  Ost 2nd Diag to 2nd Diag lesion is 40% stenosed.  Mid LAD lesion is 30% stenosed.  Prox LAD to Mid LAD lesion is 40% stenosed.  Ost Cx to Prox Cx lesion is 100% stenosed.  Mid RCA lesion is 30% stenosed.  Prox RCA lesion is 80% stenosed.  Ost RPDA to RPDA lesion is 90% stenosed.  Dist RCA lesion is 50% stenosed.  2nd Mrg lesion is 50% stenosed.  Mid LM to Prox LAD lesion is 95% stenosed.  Mid Graft to Insertion lesion is 100% stenosed.   1. Severe left main stenosis 2. Severe ostial LAD stenosis. Patent Vein graft to the Diagonal Emily Rocha that fills the LAD. The LIMA in injected and does not fill the LAD although it may just be atretic distally. It is not  clear that the LIMA was anastomosed  to the LAD. The operative note indicates the LIMA was used.  3. Chronic occlusion ostial Circumflex. Patent vein graft to the obtuse marginal Emily Rocha with moderate disease in the obtuse marginal Emily Rocha.  4. The RCA is a large dominant vessel. There is a heavily calcified proximal stenosis that appears to be severe angiographically. RFR with flow wire was 0.89 suggesting the lesion is flow limiting.  5. Severe aortic stenosis (mean gradient 40.3 mmHg, peak to peak gradient 55 mmHg, AVA 0.75 cm2)  Recommendations: She will need to have staged PCI of the RCA with orbital atherectomy and stenting. I will plan this for next week. After the PCI, will then continue planning for TAVR with CT scans.      Indications  Severe aortic stenosis [I35.0 (ICD-10-CM)]  Coronary artery disease involving native coronary artery of native heart with unstable angina pectoris (HCC) [I25.110 (ICD-10-CM)]   Procedural Details  Technical Details Indication: 65 yo female with CAD s/p CABG and now severe aortic stenosis  Procedure: The risks, benefits, complications, treatment options, and expected outcomes were discussed with the patient. The patient and/or family concurred with the proposed plan, giving informed consent. The patient was brought to the cath lab after IV hydration was given. The patient was sedated with Versed and Fentanyl. The right groin was prepped and draped in the usual manner. Using the modified Seldinger access technique, a 5 French sheath was placed in the right femoral artery and a 7 French sheath was placed in the right femoral vein using u/s guidance. Right heart catheterization performed using a balloon tipped catheter. Standard diagnostic catheters were used to perform selective coronary angiography. The JR4 was used to engage the native RCA and all grafts. The LIMA does not appear to have been used for bypass. The JR4 catheter was used to cross the aortic valve. LV pressures measured. No LV  gram.  I elected to perform pressure wire assessment of the RCA proximal to mid lesion. She was given 10,000 units IV heparin. ACT over 300. The arterial sheath was upsized to a 6 [place] system. A JR4 guiding catheter was used to engage the RCA. I then passed the Pressure Wire X flow wire down the RCA. RFR 0.89 beyond the calcified stenosis. The wire and guiding catheter were removed.   There were no immediate complications. The patient was taken to the recovery area in stable condition.  Estimated blood loss <50 mL.   During this procedure medications were administered to achieve and maintain moderate conscious sedation while the patient's heart rate, blood pressure, and oxygen saturation were continuously monitored and I was present face-to-face 100% of this time.   Medications (Filter: Administrations occurring from 0829 to 0942 on 06/04/20) (important) Continuous medications are totaled by the amount administered until 06/04/20 0942.    Heparin (Porcine) in NaCl 1000-0.9 UT/500ML-% SOLN (mL) Total volume:  1,000 mL  Date/Time Rate/Dose/Volume Action   06/04/20 0834 500 mL Given   0834 500 mL Given    midazolam (VERSED) injection (mg) Total dose:  1 mg  Date/Time Rate/Dose/Volume Action   06/04/20 0837 1 mg Given    fentaNYL (SUBLIMAZE) injection (mcg) Total dose:  25 mcg  Date/Time Rate/Dose/Volume Action   06/04/20 0837 25 mcg Given    lidocaine (PF) (XYLOCAINE) 1 % injection (mL) Total volume:  12 mL  Date/Time Rate/Dose/Volume Action   06/04/20 0844 12 mL Given    heparin sodium (porcine) injection (Units) Total dose:  10,000 Units  Date/Time Rate/Dose/Volume Action   06/04/20 0910 10,000 Units Given    iohexol (OMNIPAQUE) 350 MG/ML injection (mL) Total volume:  80 mL  Date/Time Rate/Dose/Volume Action   06/04/20 0930 80 mL Given    Sedation Time  Sedation Time Physician-1: 46 minutes 48 seconds   Radiation/Fluoro  Fluoro time: 10.5 (min) DAP: 22517  (mGycm2) Cumulative Air Kerma: 253 (mGy)   Complications   Complications documented before study signed (06/04/2020 10:03 AM)       Log Level Complications  None Documented by Emily Blanks, MD 06/04/2020 9:44 AM  Date Found: 06/04/2020  Time Range: Intraprocedure     Coronary Findings   Diagnostic Dominance: Right  Left Main  Ost LM to LM lesion is 99% stenosed. The lesion is calcified.  Mid LM to Prox LAD lesion is 95% stenosed.  Left Anterior Descending  Vessel is large.  Prox LAD to Mid LAD lesion is 40% stenosed. The lesion is calcified.  Mid LAD lesion is 30% stenosed.  First Diagonal Emily Rocha  First Septal Emily Rocha  Vessel is small in size.  Second Diagonal Emily Rocha  Vessel is moderate in size.  Ost 2nd Diag to 2nd Diag lesion is 40% stenosed.  Second Septal Emily Rocha  Vessel is small in size.  Third Diagonal Emily Rocha  Vessel is small in size.  Third Septal Emily Rocha  Vessel is small in size.  Left Circumflex  Vessel is moderate in size.  Collaterals  Dist Cx filled by collaterals from Inf Sept.    Ost Cx to Prox Cx lesion is 100% stenosed. The lesion is chronically occluded.  Second Obtuse Marginal Emily Rocha  2nd Mrg lesion is 50% stenosed.  Right Coronary Artery  Prox RCA lesion is 80% stenosed. The lesion is calcified.  Mid RCA lesion is 30% stenosed. The lesion is calcified.  Dist RCA lesion is 50% stenosed.  Right Posterior Descending Artery  Ost RPDA to RPDA lesion is 90% stenosed. The lesion is calcified.  Graft To 2nd Mrg  Graft To 1st Diag  LIMA Graft To Dist LAD  Mid Graft to Insertion lesion is 100% stenosed. The lesion is chronically occluded.   Intervention   No interventions have been documented.  Coronary Diagrams   Diagnostic Dominance: Right    Intervention    Implants    No implant documentation for this case.   Syngo Images  Show images for CARDIAC CATHETERIZATION  Images on Long Term Storage  Show images  for Brianny, Soulliere to Procedure Log  Procedure Log     Hemo Data  Flowsheet Row Most Recent Value  Fick Cardiac Output 5.03 L/min  Fick Cardiac Output Index 2.74 (L/min)/BSA  Aortic Mean Gradient 40.3 mmHg  Aortic Peak Gradient 55 mmHg  Aortic Valve Area 0.75  Aortic Value Area Index 0.41 cm2/BSA  RA A Wave 4 mmHg  RA V Wave 10 mmHg  RA Mean 7 mmHg  RV Systolic Pressure 58 mmHg  RV Diastolic Pressure 2 mmHg  RV EDP 8 mmHg  PA Systolic Pressure 52 mmHg  PA Diastolic Pressure 18 mmHg  PA Mean 31 mmHg  PW A Wave 8 mmHg  PW V Wave 4 mmHg  PW Mean 5 mmHg  AO Systolic Pressure 664 mmHg  AO Diastolic Pressure 54 mmHg  AO Mean 76 mmHg  LV Systolic Pressure 403 mmHg  LV Diastolic Pressure 5 mmHg  LV EDP 12 mmHg  AOp Systolic Pressure 474 mmHg  AOp Diastolic Pressure 57 mmHg  AOp Mean Pressure 83 mmHg  LVp Systolic Pressure 680 mmHg  LVp Diastolic Pressure 6 mmHg  LVp EDP Pressure 13 mmHg  QP/QS 1  TPVR Index 11.31 HRUI  TSVR Index 27.73 HRUI  PVR SVR Ratio 0.38  TPVR/TSVR Ratio 0.41    ADDENDUM REPORT: 06/26/2020 09:45  EXAM: OVER-READ INTERPRETATION  CT CHEST  The following report is an over-read performed by radiologist Dr. Samara Snide [facility] on 06/26/2020. This over-read does not include interpretation of cardiac or coronary anatomy or pathology. The coronary CTA interpretation by the cardiologist is attached.  COMPARISON:  11/02/2019 chest CT.  FINDINGS: Please see the separate concurrent chest CT angiogram report for details.  IMPRESSION: Please see the separate concurrent chest CT angiogram report for details.   Electronically Signed   By: Ilona Sorrel M.D.   On: 06/26/2020 09:45   Addended by Sharyn Blitz, MD on 06/26/2020 9:47 AM    Study Result  Narrative & Impression  CLINICAL DATA:  Aortic Stenosis  EXAM: Cardiac TAVR CT  TECHNIQUE: The patient was scanned on a Siemens Force 321 slice  scanner. A 120 kV retrospective scan was triggered in the ascending thoracic aorta at 140 HU's. Gantry rotation speed was 250 msecs and collimation was .6 mm. No beta blockade or nitro were given. The 3D data set was reconstructed in 5% intervals of the R-R cycle. Systolic and diastolic phases were analyzed on a dedicated work station using MPR, MIP and VRT modes. The patient received 80 cc of contrast.  FINDINGS: Aortic Valve: Tri leaflet severely calcified with restricted leaflet motion Calcium score 3013  Aorta: Severe calcific atherosclerosis with normal arch vessels no aneurysm  Sino-tubular Junction: 2.5 cm  Ascending Thoracic Aorta: 3.3 cm  Aortic Arch: 2.4 cm  Descending Thoracic Aorta: 2.3 cm  Sinus of Valsalva Measurements:  Non-coronary: 28.2 mm  Right - coronary: 28.7 mm  Left -   coronary: 30.3 mm  Coronary Artery Height above Annulus:  Left Main: 11.9 mm above annulus  Right Coronary: Somewhat shallow 9.6 mm above annulus  Virtual Basal Annulus Measurements:  Maximum / Minimum Diameter: 23.2 mm x 21.6 mm  Perimeter: 74 mm  Area: 417.8 mm2  Coronary Arteries: Sufficient height above annulus for deployment although RCA a bit shallow There is a patent SVG to OM, patent SVG to D1. The LIMA  Is patent but seems atretic in its distal portion entering the distal LAD  Optimum Fluoroscopic Angle for Delivery: LAO 38 Cranial 11 degrees  IMPRESSION: 1. Tri leaflet AV severely calcified with restricted leaflet motion Calcium score 3013  2.  Annular area 417.8 mm2 suitable for a 23 mm Sapien 3 valve  3. Dense area of calcification at the base of the left cusp annulus protruding 5 mm into the annulus and extending subvalvular to mitral annulus increases risk of significant peri valvular regurgitation  4. Coronary arteries sufficient height above annulus for deployment although RCA a bit shallow at 9.6 mm. Patent SVG to OM,  Patent SVG to D1 and patent but  Atretic appearing LIMA to distal LAD  5. Optimum angiographic angle for deployment LAO 38 Cranial 11 degrees  Emily Rocha  Electronically Signed: By: Emily Rocha M.D. On: 06/24/2020 11:49    Narrative & Impression  CLINICAL DATA:  TAVR planning.  Severe aortic stenosis.  EXAM: CT ANGIOGRAPHY CHEST, ABDOMEN AND PELVIS  TECHNIQUE: Multidetector CT imaging through the chest, abdomen and pelvis was performed using the standard protocol during bolus  administration of intravenous contrast. Multiplanar reconstructed images and MIPs were obtained and reviewed to evaluate the vascular anatomy.  CONTRAST:  129mL OMNIPAQUE IOHEXOL 350 MG/ML SOLN  COMPARISON:  11/02/2019 chest CT.  FINDINGS: CTA CHEST FINDINGS  Cardiovascular: Mild cardiomegaly. Diffuse thickening and coarse calcification of the aortic valve. No significant pericardial effusion/thickening. Three-vessel coronary atherosclerosis status post CABG. Atherosclerotic nonaneurysmal thoracic aorta. Normal caliber pulmonary arteries. No central pulmonary emboli.  Mediastinum/Nodes: No discrete thyroid nodules. Unremarkable esophagus. No pathologically enlarged axillary, mediastinal or hilar lymph nodes.  Lungs/Pleura: No pneumothorax. No pleural effusion. Status post right upper lobectomy. Severe centrilobular emphysema with diffuse bronchial wall thickening. Clustered dense solid anterior left upper lobe pulmonary nodules, largest 5 mm (series 6/image 63), stable, presumably benign. No acute consolidative airspace disease, lung masses or new significant pulmonary nodules.  Musculoskeletal: No aggressive appearing focal osseous lesions. Intact sternotomy wires. Mild thoracic spondylosis.  CTA ABDOMEN AND PELVIS FINDINGS  Hepatobiliary: Normal liver with no liver mass. Cholecystectomy. Bile ducts are stable and within normal post cholecystectomy limits with CBD  diameter 7 mm.  Pancreas: Normal, with no mass or duct dilation.  Spleen: Normal size. No mass.  Adrenals/Urinary Tract: Stable 1.0 cm right adrenal nodule with density -15 HU, compatible with a benign adenoma. No left adrenal nodules. No hydronephrosis. No contour deforming renal masses. Normal bladder.  Stomach/Bowel: Moderate hiatal hernia. Otherwise normal nondistended stomach. Normal caliber small bowel with no small bowel wall thickening. Normal appendix. Marked left colonic diverticulosis with no large bowel wall thickening or significant pericolonic fat stranding.  Vascular/Lymphatic: Atherosclerotic nonaneurysmal abdominal aorta. No pathologically enlarged lymph nodes in the abdomen or pelvis.  Reproductive: Small coarsely calcified uterine fibroids, largest 1.1 cm anteriorly in the right fundal uterus. No adnexal mass.  Other: No pneumoperitoneum, ascites or focal fluid collection. Small to moderate fat containing supraumbilical ventral abdominal hernia in the midline high abdominal wall.  Musculoskeletal: No aggressive appearing focal osseous lesions. Mild-to-moderate lumbar spondylosis.  VASCULAR MEASUREMENTS PERTINENT TO TAVR:  AORTA:  Minimal Aortic Diameter-10.7 x 9.9 mm  Severity of Aortic Calcification-severe  RIGHT PELVIS:  Right Common Iliac Artery -  Minimal Diameter-8.9 x 6.9 mm  Tortuosity-mild  Calcification-moderate to severe  Right External Iliac Artery -  Minimal Diameter-6.0 x 5.8 mm  Tortuosity-mild-to-moderate  Calcification-none  Right Common Femoral Artery -  Minimal Diameter-4.2 x 3.9 mm  Tortuosity-mild  Calcification-severe  LEFT PELVIS:  Left Common Iliac Artery -  Minimal Diameter-8.2 x 6.1 mm  Tortuosity-mild  Calcification-severe  Left External Iliac Artery -  Minimal Diameter-6.2 x 6.0 mm  Tortuosity-mild  Calcification-none  Left Common Femoral Artery  -  Minimal Diameter-4.6 x 3.8 mm  Tortuosity-mild  Calcification-moderate to severe  Review of the MIP images confirms the above findings.  IMPRESSION: 1. Vascular findings and measurements pertinent to potential TAVR procedure, as detailed. 2. Diffusely thickened and coarsely calcified aortic valve, compatible with the reported history of severe aortic stenosis. 3. Mild cardiomegaly. 4. Severe centrilobular emphysema with diffuse bronchial wall thickening, suggesting COPD. 5. Moderate hiatal hernia. 6. Marked left colonic diverticulosis. 7. Stable right adrenal adenoma. 8. Small to moderate fat containing supraumbilical ventral abdominal hernia. 9. Aortic Atherosclerosis (ICD10-I70.0) and Emphysema (ICD10-J43.9).   Electronically Signed   By: Ilona Sorrel M.D.   On: 06/26/2020 09:26    STS Risk Score: Procedure: Isolated AVR Risk of Mortality: 4.604% Renal Failure: 3.967% Permanent Stroke: 4.812% Prolonged Ventilation: 13.248% DSW Infection: 0.140% Reoperation: 3.774% Morbidity or Mortality: 20.673% Short Length of  Stay: 32.123% Long Length of Stay: 7.589%   Impression:  This 65 year old woman has stage D, severe, symptomatic aortic stenosis with near Heart Association class II-III symptoms of exertional fatigue and shortness of breath consistent with chronic diastolic congestive heart failure.  She recently underwent PCI of a high-grade right coronary stenosis with a significant improvement in her symptoms.  I have personally reviewed her 2D echocardiogram, cardiac catheterization, and CTA studies.  Her 2D echocardiogram shows a trileaflet aortic valve with thickening and calcification causing restricted leaflet mobility.  The mean gradient is 43 mmHg with a dimensionless index of 0.23 consistent with severe aortic stenosis.  Left ventricular systolic function is normal.  Cardiac catheterization shows severe native left main and three-vessel coronary  disease with patent vein grafts and a atretic mammary to the LAD that is filling by collaterals from the diagonal vein graft.  She underwent successful PCI and stenting of a large dominant RCA.  The mean gradient across aortic valve was measured at 40.3 mmHg the peak to peak gradient of 55 mmHg.  I agree that aortic valve replacement is indicated in this patient for relief of her symptoms and to prevent progressive left ventricular deterioration.  With her prior coronary bypass surgery and ascending aortic graft up to the right subclavian and right common carotid arteries and complex medical history I do not think she would be a candidate for third time redo sternotomy for aortic valve replacement.  I think transcatheter aortic valve replacement be the best option for her.  Her gated cardiac CTA shows anatomy suitable for TAVR using a SAPIEN 3 valve.  Her abdominal and pelvic CTA shows adequate pelvic vascular anatomy to allow transfemoral insertion.  The patient was counseled at length regarding treatment alternatives for management of severe symptomatic aortic stenosis. The risks and benefits of surgical intervention has been discussed in detail. Long-term prognosis with medical therapy was discussed. Alternative approaches such as conventional surgical aortic valve replacement, transcatheter aortic valve replacement, and palliative medical therapy were compared and contrasted at length. This discussion was placed in the context of the patient's own specific clinical presentation and past medical history. All of her questions have been addressed.   Following the decision to proceed with transcatheter aortic valve replacement, a discussion was held regarding what types of management strategies would be attempted intraoperatively in the event of life-threatening complications, including whether or not the patient would be considered a candidate for the use of cardiopulmonary bypass and/or conversion to open  sternotomy for attempted surgical intervention.  I do not think she is a candidate for emergent sternotomy to manage any intraoperative complications since she has had 2 previous sternotomies and it would be difficult and time-consuming to reenter her chest again.  I discussed this with her.  The patient is aware of the fact that transient use of cardiopulmonary bypass may be necessary. The patient has been advised of a variety of complications that might develop including but not limited to risks of death, stroke, paravalvular leak, aortic dissection or other major vascular complications, aortic annulus rupture, device embolization, cardiac rupture or perforation, mitral regurgitation, acute myocardial infarction, arrhythmia, heart block or bradycardia requiring permanent pacemaker placement, congestive heart failure, respiratory failure, renal failure, pneumonia, infection, other late complications related to structural valve deterioration or migration, or other complications that might ultimately cause a temporary or permanent loss of functional independence or other long term morbidity. The patient provides full informed consent for the procedure as described and all questions  were answered.     Plan:  She will be scheduled for transfemoral transcatheter aortic valve replacement later this month.  I spent 60 minutes performing this consultation and > 50% of this time was spent face to face counseling and coordinating the care of this patient's severe symptomatic aortic stenosis.    Gaye Pollack, MD 07/16/2020 11:50 AM

## 2020-07-18 DIAGNOSIS — K219 Gastro-esophageal reflux disease without esophagitis: Secondary | ICD-10-CM | POA: Diagnosis not present

## 2020-07-18 DIAGNOSIS — F419 Anxiety disorder, unspecified: Secondary | ICD-10-CM | POA: Diagnosis not present

## 2020-07-18 DIAGNOSIS — Z299 Encounter for prophylactic measures, unspecified: Secondary | ICD-10-CM | POA: Diagnosis not present

## 2020-07-18 DIAGNOSIS — D692 Other nonthrombocytopenic purpura: Secondary | ICD-10-CM | POA: Diagnosis not present

## 2020-07-18 DIAGNOSIS — F1320 Sedative, hypnotic or anxiolytic dependence, uncomplicated: Secondary | ICD-10-CM | POA: Diagnosis not present

## 2020-07-23 DIAGNOSIS — I700 Atherosclerosis of aorta: Secondary | ICD-10-CM | POA: Diagnosis not present

## 2020-07-23 DIAGNOSIS — Z299 Encounter for prophylactic measures, unspecified: Secondary | ICD-10-CM | POA: Diagnosis not present

## 2020-07-23 DIAGNOSIS — J209 Acute bronchitis, unspecified: Secondary | ICD-10-CM | POA: Diagnosis not present

## 2020-07-23 DIAGNOSIS — J441 Chronic obstructive pulmonary disease with (acute) exacerbation: Secondary | ICD-10-CM | POA: Diagnosis not present

## 2020-07-23 DIAGNOSIS — J440 Chronic obstructive pulmonary disease with acute lower respiratory infection: Secondary | ICD-10-CM | POA: Diagnosis not present

## 2020-07-28 ENCOUNTER — Other Ambulatory Visit: Payer: Self-pay

## 2020-07-28 DIAGNOSIS — I350 Nonrheumatic aortic (valve) stenosis: Secondary | ICD-10-CM

## 2020-08-05 ENCOUNTER — Ambulatory Visit: Payer: Medicare HMO | Admitting: Cardiology

## 2020-08-07 NOTE — Progress Notes (Signed)
Surgical Instructions    Your procedure is scheduled on Tuesday, August 12, 2020.  Report to [facility] Main Entrance "A" at 07:15 A.M., then check in with the Admitting office.  Call this number if you have problems the morning of surgery:  [phone]   If you have any questions prior to your surgery date call [phone]: Open Monday-Friday 8am-4pm    Remember:  Do not eat or drink after midnight the night before your surgery     On the morning of surgery DO NOT take any medication.  Follow your surgeon's instructions on when to stop Aspirin and Plavix.  If no instructions were given by your surgeon then you will need to call the office to get those instructions.     As of today, STOP taking any Aspirin (unless otherwise instructed by your surgeon) Aleve, Naproxen, Ibuprofen, Motrin, Advil, Goody's, BC's, all herbal medications, fish oil, and all vitamins.          Do not wear jewelry or makeup Do not wear lotions, powders, perfumes, or deodorant. Do not shave 48 hours prior to surgery.   Do not bring valuables to the hospital. DO Not wear nail polish, gel polish, artificial nails, or any other type of covering on natural nails including finger and toenails. If patients have artificial nails, gel coating, etc. that need to be removed by a nail salon please have this removed prior to surgery or surgery may need to be canceled/delayed if the surgeon/ anesthesia feels like the patient is unable to be adequately monitored.             [facility] is not responsible for any belongings or valuables.  Do NOT Smoke (Tobacco/Vaping) or drink Alcohol 24 hours prior to your procedure If you use a CPAP at night, you may bring all equipment for your overnight stay.   Contacts, glasses, dentures or bridgework may not be worn into surgery, please bring cases for these belongings   For patients admitted to the hospital, discharge time will be determined by your treatment team.   Patients  discharged the day of surgery will not be allowed to drive home, and someone needs to stay with them for 24 hours.  ONLY 1 SUPPORT PERSON MAY BE PRESENT WHILE YOU ARE IN SURGERY. IF YOU ARE TO BE ADMITTED ONCE YOU ARE IN YOUR ROOM YOU WILL BE ALLOWED TWO (2) VISITORS.  Minor children may have two parents present. Special consideration for safety and communication needs will be reviewed on a case by case basis.  Special instructions:    Oral Hygiene is also important to reduce your risk of infection.  Remember - BRUSH YOUR TEETH THE MORNING OF SURGERY WITH YOUR REGULAR TOOTHPASTE   [facility]- Preparing For Surgery  Before surgery, you can play an important role. Because skin is not sterile, your skin needs to be as free of germs as possible. You can reduce the number of germs on your skin by washing with CHG (chlorahexidine gluconate) Soap before surgery.  CHG is an antiseptic cleaner which kills germs and bonds with the skin to continue killing germs even after washing.     Please do not use if you have an allergy to CHG or antibacterial soaps. If your skin becomes reddened/irritated stop using the CHG.  Do not shave (including legs and underarms) for at least 48 hours prior to first CHG shower. It is OK to shave your face.  Please follow these instructions carefully.  Shower the NIGHT BEFORE SURGERY and the MORNING OF SURGERY with CHG Soap.   If you chose to wash your hair, wash your hair first as usual with your normal shampoo. After you shampoo, rinse your hair and body thoroughly to remove the shampoo.  Then [employer] and genitals (private parts) with your normal soap and rinse thoroughly to remove soap.  After that Use CHG Soap as you would any other liquid soap. You can apply CHG directly to the skin and wash gently with a scrungie or a clean washcloth.   Apply the CHG Soap to your body ONLY FROM THE NECK DOWN.  Do not use on open wounds or open sores. Avoid contact with your  eyes, ears, mouth and genitals (private parts). Wash Face and genitals (private parts)  with your normal soap.   Wash thoroughly, paying special attention to the area where your surgery will be performed.  Thoroughly rinse your body with warm water from the neck down.  DO NOT shower/wash with your normal soap after using and rinsing off the CHG Soap.  Pat yourself dry with a CLEAN TOWEL.  Wear CLEAN PAJAMAS to bed the night before surgery  Place CLEAN SHEETS on your bed the night before your surgery  DO NOT SLEEP WITH PETS.   Day of Surgery:  Take a shower with CHG soap. Wear Clean/Comfortable clothing the morning of surgery Do not apply any deodorants/lotions.   Remember to brush your teeth WITH YOUR REGULAR TOOTHPASTE.   Please read over the following fact sheets that you were given.  Tuesday, August 12, 2020

## 2020-08-08 ENCOUNTER — Telehealth: Payer: Medicare HMO | Admitting: Thoracic Surgery (Cardiothoracic Vascular Surgery)

## 2020-08-08 ENCOUNTER — Encounter (HOSPITAL_COMMUNITY)
Admission: RE | Admit: 2020-08-08 | Discharge: 2020-08-08 | Disposition: A | Discharge status: Home / Self Care | Payer: Medicare HMO | Source: Ambulatory Visit | Attending: Cardiovascular Disease | Admitting: Cardiovascular Disease

## 2020-08-08 ENCOUNTER — Encounter (HOSPITAL_COMMUNITY): Payer: Self-pay

## 2020-08-08 ENCOUNTER — Other Ambulatory Visit: Payer: Self-pay

## 2020-08-08 ENCOUNTER — Ambulatory Visit (HOSPITAL_COMMUNITY)
Admission: RE | Admit: 2020-08-08 | Discharge: 2020-08-08 | Disposition: A | Discharge status: Home / Self Care | Payer: Medicare HMO | Source: Ambulatory Visit | Attending: Cardiovascular Disease | Admitting: Cardiovascular Disease

## 2020-08-08 DIAGNOSIS — K449 Diaphragmatic hernia without obstruction or gangrene: Secondary | ICD-10-CM | POA: Diagnosis not present

## 2020-08-08 DIAGNOSIS — J439 Emphysema, unspecified: Secondary | ICD-10-CM | POA: Diagnosis not present

## 2020-08-08 DIAGNOSIS — Z01818 Encounter for other preprocedural examination: Secondary | ICD-10-CM | POA: Insufficient documentation

## 2020-08-08 DIAGNOSIS — Z20822 Contact with and (suspected) exposure to covid-19: Secondary | ICD-10-CM | POA: Diagnosis not present

## 2020-08-08 DIAGNOSIS — I350 Nonrheumatic aortic (valve) stenosis: Secondary | ICD-10-CM | POA: Insufficient documentation

## 2020-08-08 DIAGNOSIS — I700 Atherosclerosis of aorta: Secondary | ICD-10-CM | POA: Insufficient documentation

## 2020-08-08 DIAGNOSIS — Z951 Presence of aortocoronary bypass graft: Secondary | ICD-10-CM | POA: Diagnosis not present

## 2020-08-08 HISTORY — DX: Dyspnea, unspecified: R06.00

## 2020-08-08 HISTORY — DX: Acute myocardial infarction, unspecified: I21.9

## 2020-08-08 LAB — COMPREHENSIVE METABOLIC PANEL
ALT: 12 U/L (ref 0–44)
AST: 16 U/L (ref 15–41)
Albumin: 3.5 g/dL (ref 3.5–5.0)
Alkaline Phosphatase: 68 U/L (ref 38–126)
Anion gap: 12 (ref 5–15)
BUN: 17 mg/dL (ref 8–23)
CO2: 25 mmol/L (ref 22–32)
Calcium: 9 mg/dL (ref 8.9–10.3)
Chloride: 99 mmol/L (ref 98–111)
Creatinine, Ser: 0.68 mg/dL (ref 0.44–1.00)
GFR, Estimated: >60 mL/min (ref >60)
Glucose, Bld: 103 mg/dL — ABNORMAL HIGH (ref 70–99)
Potassium: 4.1 mmol/L (ref 3.5–5.1)
Sodium: 136 mmol/L (ref 135–145)
Total Bilirubin: 0.7 mg/dL (ref 0.3–1.2)
Total Protein: 6.4 g/dL — ABNORMAL LOW (ref 6.5–8.1)

## 2020-08-08 LAB — SARS CORONAVIRUS 2 (TAT 6-24 HRS): SARS Coronavirus 2: NEGATIVE

## 2020-08-08 LAB — URINALYSIS, ROUTINE W REFLEX MICROSCOPIC
Bilirubin Urine: NEGATIVE
Glucose, UA: NEGATIVE mg/dL
Hgb urine dipstick: NEGATIVE
Ketones, ur: NEGATIVE mg/dL
Nitrite: NEGATIVE
Protein, ur: NEGATIVE mg/dL
Specific Gravity, Urine: 1.02 (ref 1.005–1.030)
pH: 5.5 (ref 5.0–8.0)

## 2020-08-08 LAB — PROTIME-INR
INR: 0.9 (ref 0.8–1.2)
Prothrombin Time: 11.9 seconds (ref 11.4–15.2)

## 2020-08-08 LAB — CBC
HCT: 38 % (ref 36.0–46.0)
Hemoglobin: 11.4 g/dL — ABNORMAL LOW (ref 12.0–15.0)
MCH: 23.6 pg — ABNORMAL LOW (ref 26.0–34.0)
MCHC: 30 g/dL (ref 30.0–36.0)
MCV: 78.7 fL — ABNORMAL LOW (ref 80.0–100.0)
Platelets: 244 10*3/uL (ref 150–400)
RBC: 4.83 MIL/uL (ref 3.87–5.11)
RDW: 17.4 % — ABNORMAL HIGH (ref 11.5–15.5)
WBC: 5.6 10*3/uL (ref 4.0–10.5)
nRBC: 0 % (ref 0.0–0.2)

## 2020-08-08 LAB — BLOOD GAS, ARTERIAL
Acid-Base Excess: 4.6 mmol/L — ABNORMAL HIGH (ref 0.0–2.0)
Bicarbonate: 29 mmol/L — ABNORMAL HIGH (ref 20.0–28.0)
Drawn by: 58743
FIO2: 21
O2 Saturation: 95.3 %
Patient temperature: 37
pCO2 arterial: 45.8 mmHg (ref 32.0–48.0)
pH, Arterial: 7.418 (ref 7.350–7.450)
pO2, Arterial: 79 mmHg — ABNORMAL LOW (ref 83.0–108.0)

## 2020-08-08 LAB — SURGICAL PCR SCREEN
MRSA, PCR: POSITIVE — AB
Staphylococcus aureus: POSITIVE — AB

## 2020-08-08 LAB — URINALYSIS, MICROSCOPIC (REFLEX)

## 2020-08-08 LAB — TYPE AND SCREEN
ABO/RH(D): A NEG
Antibody Screen: NEGATIVE

## 2020-08-08 NOTE — Progress Notes (Signed)
[employer] sent to Dr. Angelena Form to make him aware that patient's PCR screen is positive for MRSA and MSSA and that patient has a history of MRSA.

## 2020-08-08 NOTE — Progress Notes (Signed)
Surgical Instructions    Your procedure is scheduled on Tuesday, August 12, 2020.  Report to [facility] Main Entrance "A" at 07:15 A.M., then check in with the Admitting office.  Call this number if you have problems the morning of surgery:  [phone]   If you have any questions prior to your surgery date call [phone]: Open Monday-Friday 8am-4pm    Remember:  Do not eat or drink after midnight the night before your surgery     On the morning of surgery DO NOT take any medication.  Per your surgeon's instructions Continue taking all medications without change through the day before surgery. On the morning of surgery do not take any medications.           Do not wear jewelry or makeup Do not wear lotions, powders, perfumes, or deodorant. Do not shave 48 hours prior to surgery.   Do not bring valuables to the hospital. DO Not wear nail polish, gel polish, artificial nails, or any other type of covering on natural nails including finger and toenails. If patients have artificial nails, gel coating, etc. that need to be removed by a nail salon please have this removed prior to surgery or surgery may need to be canceled/delayed if the surgeon/ anesthesia feels like the patient is unable to be adequately monitored.             [facility] is not responsible for any belongings or valuables.  Do NOT Smoke (Tobacco/Vaping) or drink Alcohol 24 hours prior to your procedure If you use a CPAP at night, you may bring all equipment for your overnight stay.   Contacts, glasses, dentures or bridgework may not be worn into surgery, please bring cases for these belongings   For patients admitted to the hospital, discharge time will be determined by your treatment team.   Patients discharged the day of surgery will not be allowed to drive home, and someone needs to stay with them for 24 hours.  ONLY 1 SUPPORT PERSON MAY BE PRESENT WHILE YOU ARE IN SURGERY. IF YOU ARE TO BE ADMITTED ONCE YOU  ARE IN YOUR ROOM YOU WILL BE ALLOWED TWO (2) VISITORS.  Minor children may have two parents present. Special consideration for safety and communication needs will be reviewed on a case by case basis.  Special instructions:    Oral Hygiene is also important to reduce your risk of infection.  Remember - BRUSH YOUR TEETH THE MORNING OF SURGERY WITH YOUR REGULAR TOOTHPASTE   [facility]- Preparing For Surgery  Before surgery, you can play an important role. Because skin is not sterile, your skin needs to be as free of germs as possible. You can reduce the number of germs on your skin by washing with CHG (chlorahexidine gluconate) Soap before surgery.  CHG is an antiseptic cleaner which kills germs and bonds with the skin to continue killing germs even after washing.     Please do not use if you have an allergy to CHG or antibacterial soaps. If your skin becomes reddened/irritated stop using the CHG.  Do not shave (including legs and underarms) for at least 48 hours prior to first CHG shower. It is OK to shave your face.  Please follow these instructions carefully.     Shower the NIGHT BEFORE SURGERY and the MORNING OF SURGERY with CHG Soap.   If you chose to wash your hair, wash your hair first as usual with your normal shampoo. After you shampoo, rinse your  hair and body thoroughly to remove the shampoo.  Then [employer] and genitals (private parts) with your normal soap and rinse thoroughly to remove soap.  After that Use CHG Soap as you would any other liquid soap. You can apply CHG directly to the skin and wash gently with a scrungie or a clean washcloth.   Apply the CHG Soap to your body ONLY FROM THE NECK DOWN.  Do not use on open wounds or open sores. Avoid contact with your eyes, ears, mouth and genitals (private parts). Wash Face and genitals (private parts)  with your normal soap.   Wash thoroughly, paying special attention to the area where your surgery will be performed.  Thoroughly  rinse your body with warm water from the neck down.  DO NOT shower/wash with your normal soap after using and rinsing off the CHG Soap.  Pat yourself dry with a CLEAN TOWEL.  Wear CLEAN PAJAMAS to bed the night before surgery  Place CLEAN SHEETS on your bed the night before your surgery  DO NOT SLEEP WITH PETS.   Day of Surgery:  Take a shower with CHG soap. Wear Clean/Comfortable clothing the morning of surgery Do not apply any deodorants/lotions.   Remember to brush your teeth WITH YOUR REGULAR TOOTHPASTE.   Please read over the following fact sheets that you were given.  Tuesday, August 12, 2020

## 2020-08-08 NOTE — Progress Notes (Addendum)
PCP - Dr. Vernard Gambles Cardiologist - Dr. Carlyle Dolly  PPM/ICD - n/a Device Orders - n/a Rep Notified - n/a  Chest x-ray - 08/08/20 EKG - 08/08/20 Stress Test - 10/17/18 ECHO - 04/28/20 Cardiac Cath - 06/11/20  Sleep Study - denies CPAP - n/a  Fasting Blood Sugar - n/a  Blood Thinner Instructions: Per instructions received from Theodosia Quay at [facility] patient is to continue taking all medications without change through the day before surgery. On the morning of surgery do not take any medications.  Aspirin Instructions:    COVID TEST- Yes. At PAT appointment 08/08/20. Pending   Anesthesia review: Yes. Cardiac History. EKG Review  Patient denies shortness of breath, fever, cough and chest pain at PAT appointment   All instructions explained to the patient, with a verbal understanding of the material. Patient agrees to go over the instructions while at home for a better understanding. Patient also instructed to self quarantine after being tested for COVID-19. The opportunity to ask questions was provided.

## 2020-08-08 NOTE — Progress Notes (Signed)
Messaged L. Brown at [facility] to notify of abnormal urinalysis result from today's PAT visit.

## 2020-08-11 MED ORDER — NOREPINEPHRINE 4 MG/250ML-% IV SOLN
0.0000 ug/min | INTRAVENOUS | Status: AC
  Administered 2020-08-12: 2 ug/min via INTRAVENOUS
  Filled 2020-08-11: qty 250

## 2020-08-11 MED ORDER — MAGNESIUM SULFATE 50 % IJ SOLN
40.0000 meq | INTRAMUSCULAR | Status: DC
  Filled 2020-08-11: qty 9.85

## 2020-08-11 MED ORDER — CEFAZOLIN SODIUM-DEXTROSE 2-4 GM/100ML-% IV SOLN
2.0000 g | INTRAVENOUS | Status: AC
  Administered 2020-08-12: 2 g via INTRAVENOUS
  Filled 2020-08-11 (×2): qty 100

## 2020-08-11 MED ORDER — SODIUM CHLORIDE 0.9 % IV SOLN
INTRAVENOUS | Status: DC
  Filled 2020-08-11: qty 30

## 2020-08-11 MED ORDER — DEXMEDETOMIDINE HCL IN NACL 400 MCG/100ML IV SOLN
0.1000 ug/kg/h | INTRAVENOUS | Status: AC
  Administered 2020-08-12: 1 ug/kg/h via INTRAVENOUS
  Filled 2020-08-11 (×2): qty 100

## 2020-08-11 MED ORDER — POTASSIUM CHLORIDE 2 MEQ/ML IV SOLN
80.0000 meq | INTRAVENOUS | Status: DC
  Filled 2020-08-11: qty 40

## 2020-08-11 NOTE — H&P (Signed)
[address]       [place] [address]             [phone]      Cardiothoracic Surgery Admission History and Physical  Referring Provider is Branch, Alphonse Guild, MD Primary Cardiologist is Carlyle Dolly, MD PCP is Glenda Chroman, MD       Chief Complaint  Patient presents with   Aortic Stenosis           HPI:   The patient is a 65 year old woman who has had a long complicated medical history with coronary disease status post CABG x3 by Dr. Lucianne Lei trigt in 2018, redo sternotomy with aorto to right common carotid artery and right subclavian artery bypasses by Dr. Lucianne Lei trigt and Dr. Oneida Alar on 12/26/2017 for high-grade stenosis of the innominate artery, superficial sternal wound infection requiring wound debridement and wound VAC therapy on 01/06/2018 and 01/13/2018.  She was subsequently diagnosed with a right upper lobe pulmonary nodule and underwent VATS right upper lobectomy by Dr. Kipp Brood on 10/26/2018.  She also has a history of prior smoking and COPD, prior stroke, and severe aortic stenosis.  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  She now presents with progressive exertional dyspnea and fatigue.  Her most recent echocardiogram on 04/28/2020 showed a trileaflet, thickened and calcified aortic valve with restricted leaflet mobility.  The mean gradient was 43 mmHg and the peak 68 mmHg.  Dimensionless index was 0.23 consistent with severe aortic stenosis.  Left ventricular systolic function was low normal with ejection fraction of 55 to 60%.  Her gradients had increased compared to her previous echo in September 2021.  Cardiac catheterization on 06/04/2020 showed severe left main stenosis.  The diagonal and LAD filled from a patent vein graft to the diagonal branch.  The LIMA graft was atraumatic.  The chronically occluded left circumflex filled from a patent vein graft.  The RCA had a  severe heavily calcific stenosis.  The mean gradient across aortic valve was measured at 40.3 mmHg with a peak to peak gradient of 55 mmHg and aortic valve area of 0.75 cm.  He subsequently underwent staged PCI of the RCA with orbital atherectomy and DES on 06/12/2020 and said that she is felt much better since then with a decrease in her shortness of breath and fatigue.   She lives with her husband at home.  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  She no longer smokes.  She has full dentures.       Past Medical History:  Diagnosis Date   Anxiety     Asthma     COPD (chronic obstructive pulmonary disease) (HCC)     Coronary artery disease      hx CABG   GERD (gastroesophageal reflux disease)     Peripheral vascular disease (HCC)     Pneumonia     Severe aortic stenosis     Stroke ([place])      "mini stroke" numbness and weakness to left side of the body           Past Surgical History:  Procedure Laterality Date   AORTIC ARCH ANGIOGRAPHY N/A 11/04/2017    Procedure: AORTIC ARCH ANGIOGRAPHY;  Surgeon: Elam Dutch, MD;  Location: [place] [facility];  Service: Cardiovascular;  Laterality: N/A;   APPLICATION OF  WOUND VAC N/A 01/06/2018    Procedure: APPLICATION OF WOUND VAC;  Surgeon: Ivin Poot, MD;  Location: [place];  Service: Thoracic;  Laterality: N/A;   APPLICATION OF WOUND VAC N/A 01/13/2018    Procedure: WOUND VAC CHANGE;  Surgeon: Ivin Poot, MD;  Location: [place];  Service: Thoracic;  Laterality: N/A;   CARDIAC CATHETERIZATION       CAROTID-SUBCLAVIAN BYPASS GRAFT Right 12/26/2017    Procedure: AORTIC TO RIGHT COMMON CAROTID AND RIGHT SUBCLAVIAN  ARTERY  BYPASS;  Surgeon: Elam Dutch, MD;  Location: [place];  Service: Vascular;  Laterality: Right;   CHOLECYSTECTOMY       CLOSURE OF DIAPHRAGM   12/26/2017    Procedure: REPAIR OF DIAPHRAGM;  Surgeon: Ivin Poot, MD;  Location: [place];  Service: Thoracic;;   COLONOSCOPY N/A  10/03/2014    Procedure: COLONOSCOPY;  Surgeon: Rogene Houston, MD;  [facility];  Service: Endoscopy;  Laterality: N/A;  730   CORONARY ARTERY BYPASS GRAFT N/A 12/14/2016    Procedure: CORONARY ARTERY BYPASS GRAFTING (CABG) x three , using left internal mammary artery and right leg greater saphenous vein harvested endoscopically;  Surgeon: Ivin Poot, MD;  Location: [place];  Service: Open Heart Surgery;  Laterality: N/A;   CORONARY ATHERECTOMY N/A 06/11/2020    Procedure: CORONARY ATHERECTOMY;  Surgeon: Burnell Blanks, MD;  Location: [place] [facility];  Service: Cardiovascular;  Laterality: N/A;   CORONARY STENT INTERVENTION N/A 06/11/2020    Procedure: CORONARY STENT INTERVENTION;  Surgeon: Burnell Blanks, MD;  Location: [place] [facility];  Service: Cardiovascular;  Laterality: N/A;   ENDARTERECTOMY Right 12/06/2014    Procedure: ENDARTERECTOMY CAROTid;  Surgeon: Elam Dutch, MD;  Location: [facility];  Service: Vascular;  Laterality: Right;   ESOPHAGOGASTRODUODENOSCOPY N/A 12/03/2016    Procedure: ESOPHAGOGASTRODUODENOSCOPY (EGD);  Surgeon: Rogene Houston, MD;  [facility];  Service: Endoscopy;  Laterality: N/A;  7:30   INTRAVASCULAR IMAGING/OCT N/A 06/11/2020    Procedure: INTRAVASCULAR IMAGING/OCT;  Surgeon: Burnell Blanks, MD;  Location: [place] [facility];  Service: Cardiovascular;  Laterality: N/A;   INTRAVASCULAR PRESSURE WIRE/FFR STUDY N/A 06/04/2020    Procedure: INTRAVASCULAR PRESSURE WIRE/FFR STUDY;  Surgeon: Burnell Blanks, MD;  Location: [place] [facility];  Service: Cardiovascular;  Laterality: N/A;   LEFT HEART CATH AND CORONARY ANGIOGRAPHY N/A 12/14/2016    Procedure: LEFT HEART CATH AND CORONARY ANGIOGRAPHY;  Surgeon: Burnell Blanks, MD;  Location: [place] [facility];  Service: Cardiovascular;  Laterality: N/A;   LOBECTOMY Right 10/26/2018    Procedure: RIGHT UPPER LOBECTOMY;  Surgeon: Lajuana Matte,  MD;  Location: [place];  Service: Thoracic;  Laterality: Right;   PERIPHERAL VASCULAR CATHETERIZATION N/A 12/05/2014    Procedure:  Carotid Arch Angiography;  Surgeon: Conrad Avenal, MD;  Location: [place] [facility];  Service: Cardiovascular;  Laterality: N/A;   RIGHT/LEFT HEART CATH AND CORONARY/GRAFT ANGIOGRAPHY N/A 06/04/2020    Procedure: RIGHT/LEFT HEART CATH AND CORONARY/GRAFT ANGIOGRAPHY;  Surgeon: Burnell Blanks, MD;  Location: [place] [facility];  Service: Cardiovascular;  Laterality: N/A;   STERNAL WOUND DEBRIDEMENT N/A 01/06/2018    Procedure: STERNAL WOUND DEBRIDEMENT;  Surgeon: Ivin Poot, MD;  Location: [place];  Service: Thoracic;  Laterality: N/A;   STERNOTOMY N/A 12/26/2017    Procedure: REDO STERNOTOMY;  Surgeon: Ivin Poot, MD;  Location: [place];  Service: Thoracic;  Laterality: N/A;  TEE WITHOUT CARDIOVERSION N/A 12/14/2016    Procedure: TRANSESOPHAGEAL ECHOCARDIOGRAM (TEE);  Surgeon: Prescott Gum, Collier Salina, MD;  Location: [place];  Service: Open Heart Surgery;  Laterality: N/A;   VIDEO ASSISTED THORACOSCOPY (VATS)/WEDGE RESECTION Right 10/26/2018    Procedure: VIDEO ASSISTED THORACOSCOPY (VATS)/WEDGE RESECTION;  Surgeon: Lajuana Matte, MD;  Location: [place];  Service: Thoracic;  Laterality: Right;   VIDEO BRONCHOSCOPY N/A 10/26/2018    Procedure: VIDEO BRONCHOSCOPY;  Surgeon: Lajuana Matte, MD;  Location: [facility];  Service: Thoracic;  Laterality: N/A;           Family History  Problem Relation Age of Onset   Heart attack Mother     Cancer Mother     Diabetes Mother     Heart disease Mother     Heart attack Father     Heart disease Father        Social History         Socioeconomic History   Marital status: Married      Spouse name: Not on file   Number of children: Not on file   Years of education: Not on file   Highest education level: Not on file  Occupational History   Not on file  Tobacco Use   Smoking status: Former Smoker       Packs/day: 2.00      Years: 40.00      Pack years: 80.00      Quit date: 10/24/2015      Years since quitting: 4.7   Smokeless tobacco: Never Used  Vaping Use   Vaping Use: Former   Start date: 10/23/2013   Quit date: 10/24/2015  Substance and Sexual Activity   Alcohol use: No      Alcohol/week: 0.0 standard drinks   Drug use: No   Sexual activity: Not on file  Other Topics Concern   Not on file  Social History Narrative   Not on file    Social Determinants of Health    Financial Resource Strain: Not on file  Food Insecurity: Not on file  Transportation Needs: Not on file  Physical Activity: Not on file  Stress: Not on file  Social Connections: Not on file  Intimate Partner Violence: Not on file            Current Outpatient Medications  Medication Sig Dispense Refill   albuterol (PROVENTIL) (2.5 MG/3ML) 0.083% nebulizer solution Take 2.5 mg by nebulization every 4 (four) hours as needed for wheezing or shortness of breath.       albuterol (VENTOLIN HFA) 108 (90 Base) MCG/ACT inhaler Inhale 1-2 puffs into the lungs every 6 (six) hours as needed for wheezing or shortness of breath.       ALPRAZolam (XANAX) 0.5 MG tablet Take 0.5 mg by mouth 3 (three) times daily.       Ascorbic Acid (VITAMIN C GUMMIE) 120 MG CHEW Chew 500 mg by mouth daily.       aspirin EC 81 MG tablet Take 1 tablet (81 mg total) by mouth daily. Swallow whole. 90 tablet 3   atorvastatin (LIPITOR) 40 MG tablet Take 1 tablet (40 mg total) daily by mouth. 30 tablet 1   cholecalciferol (VITAMIN D3) 25 MCG (1000 UNIT) tablet Take 1,000 Units by mouth in the morning.       clopidogrel (PLAVIX) 75 MG tablet Take 1 tablet (75 mg total) by mouth daily. 90 tablet 1   diphenhydramine-acetaminophen (TYLENOL PM) 25-500 MG TABS tablet Take  2-3 tablets by mouth at bedtime.       Multiple Vitamin (MULTIVITAMIN WITH MINERALS) TABS tablet Take 1 tablet by mouth daily.       pantoprazole (PROTONIX) 40 MG tablet Take 40 mg by mouth  in the morning.       potassium chloride SA (KLOR-CON) 20 MEQ tablet Take 20 mEq by mouth in the morning.       Tiotropium Bromide-Olodaterol (STIOLTO RESPIMAT) 2.5-2.5 MCG/ACT AERS Inhale 2 puffs into the lungs daily. 1 each 4   torsemide (DEMADEX) 20 MG tablet Take 40 mg by mouth See admin instructions. Take 2 tablets (40 mg) by mouth scheduled on Mondays, Tuesdays, Wednesdays, Thursdays, Fridays & Saturdays. (Hold dose on Sundays)       isosorbide mononitrate (IMDUR) 30 MG 24 hr tablet Take 0.5 tablets (15 mg total) by mouth daily. 45 tablet 3   metoprolol succinate (TOPROL-XL) 50 MG 24 hr tablet Take 1 tablet (50 mg total) by mouth daily. Take with or immediately following a meal. 90 tablet 3   predniSONE (DELTASONE) 10 MG tablet Take 10 mg by mouth 2 (two) times daily.                 Current Facility-Administered Medications  Medication Dose Route Frequency Provider Last Rate Last Admin   sodium chloride flush (NS) 0.9 % injection 3 mL  3 mL Intravenous Q12H Branch, Alphonse Guild, MD          No Known Allergies       Review of Systems:               General:                      normal appetite, + decreased energy, no weight gain, no weight loss, no fever             Cardiac:                       no chest pain with exertion, no chest pain at rest, +SOB with exertion, no resting SOB, no PND, no orthopnea, no palpitations, no arrhythmia, no atrial fibrillation, no LE edema, no dizzy spells, no syncope             Respiratory:                 + exertional shortness of breath, no home oxygen, + productive cough, + dry cough, no bronchitis, + wheezing, no hemoptysis, no asthma, no pain with inspiration or cough, no sleep apnea, no CPAP at night             GI:                               no difficulty swallowing, no reflux, no frequent heartburn, + hiatal hernia, no abdominal pain, no constipation, no diarrhea, no hematochezia, no hematemesis, no melena             GU:                               no dysuria,  no frequency, no urinary tract infection, no hematuria,  no kidney stones, no kidney disease             Vascular:  no pain suggestive of claudication, no pain in feet, no leg cramps, no varicose veins, no DVT, no non-healing foot ulcer             Neuro:                         + remote stroke, no TIA's, no seizures, no headaches, no temporary blindness one eye,  no slurred speech, no peripheral neuropathy, no chronic pain, no instability of gait, no memory/cognitive dysfunction             Musculoskeletal:         no arthritis, no joint swelling, no myalgias, no difficulty walking, normal mobility             Skin:                            no rash, no itching, no skin infections, no pressure sores or ulcerations             Psych:                         + anxiety, no depression, no nervousness, no unusual recent stress             Eyes:                           no blurry vision, + floaters, no recent vision changes, does not wear glasses or contacts             ENT:                            + hearing loss, no loose or painful teeth, + dentures             Hematologic:               + easy bruising, no abnormal bleeding, no clotting disorder, no frequent epistaxis             Endocrine:                   no diabetes, does not check CBG's at home                             Physical Exam:               BP 129/65   Pulse 86   Resp 20   Ht 5\' 5"  (1.651 m)   Wt 167 lb (75.8 kg)   SpO2 95% Comment: RA  BMI 27.79 kg/m              General:                      well-appearing             HEENT:                       Unremarkable, [employer]/AT, PERLA, EOMI             Neck:                           no JVD, no bruits, no  adenopathy             Chest:                          clear to auscultation, symmetrical breath sounds, no wheezes, no rhonchi             CV:                              RRR, grade lll/VI crescendo/decrescendo murmur heard best at RSB,  no  diastolic murmur             Abdomen:                    soft, non-tender, no masses             Extremities:                 warm, well-perfused, pulses palpable at ankle, no LE edema             Rectal/GU                   Deferred             Neuro:                         Grossly non-focal and symmetrical throughout             Skin:                            Clean and dry, no rashes, no breakdown     Diagnostic Tests:   ECHOCARDIOGRAM REPORT         Patient Name:   MALANA EBERWEIN Date of Exam: 04/28/2020  Medical Rec #:  [id number]    Height:       65.0 in  Accession #:    [id number]   Weight:       167.0 lb  Date of Birth:  04/03/1955    BSA:          1.832 m  Patient Age:    42 years     BP:           124/70 mmHg  Patient Gender: F            HR:           88 bpm.  Exam Location:  [facility]   Procedure: 2D Echo   Indications:    Aortic stenosis I35.0     History:        Patient has prior history of Echocardiogram examinations,  most                  recent 11/04/2019. CHF, CAD and Previous Myocardial  Infarction,                  Prior CABG, COPD, Signs/Symptoms:Syncope and Chest Pain;  Risk                  Factors:Former Smoker.                  History of lung cancer- Non-Small cell/S/p Prior VATs.     Sonographer:    Leavy Cella RDCS (AE)  Referring Phys: [id number] [place]  1. Basal inferior akinesis. Left ventricular ejection fraction, by  estimation, is 55 to 60%. The left ventricle has normal function. Left  ventricular diastolic parameters are indeterminate.   2. Right ventricular systolic function is low normal. The right  ventricular size is mildly enlarged.   3. Trivial mitral valve regurgitation.   4. AV is thickened, calcified with restricted motion. Peak and mean  gradients through the valve are 68 and 43 mm Hg respectively Dimensionless  index is 0.23 consistent with severe AS. Compared to echo from 9.19.21,  gradients  are increased. The aortic  valve is tricuspid. Aortic valve regurgitation is not visualized.   5. The inferior vena cava is normal in size with greater than 50%  respiratory variability, suggesting right atrial pressure of 3 mmHg.   FINDINGS   Left Ventricle: Basal inferior akinesis. Left ventricular ejection  fraction, by estimation, is 55 to 60%. The left ventricle has normal  function. The left ventricular internal cavity size was normal in size.  There is no left ventricular hypertrophy.  Left ventricular diastolic parameters are indeterminate.   Right Ventricle: The right ventricular size is mildly enlarged. Right  vetricular wall thickness was not assessed. Right ventricular systolic  function is low normal.   Left Atrium: Left atrial size was normal in size.   Right Atrium: Right atrial size was normal in size.   Pericardium: There is no evidence of pericardial effusion.   Mitral Valve: There is mild thickening of the mitral valve leaflet(s).  Mild to moderate mitral annular calcification. Trivial mitral valve  regurgitation.   Tricuspid Valve: The tricuspid valve is normal in structure. Tricuspid  valve regurgitation is mild.   Aortic Valve: AV is thickened, calcified with restricted motion. Peak and  mean gradients through the valve are 68 and 43 mm Hg respectively  Dimensionless index is 0.23 consistent with severe AS. Compared to echo  from 9.19.21, gradients are increased. The   aortic valve is tricuspid. Aortic valve regurgitation is not visualized.  Aortic valve mean gradient measures 43.0 mmHg. Aortic valve peak gradient  measures 68.2 mmHg. Aortic valve area, by VTI measures 0.64 cm.   Pulmonic Valve: The pulmonic valve was grossly normal. Pulmonic valve  regurgitation is not visualized.   Aorta: The aortic root is normal in size and structure.   Venous: The inferior vena cava is normal in size with greater than 50%  respiratory variability, suggesting  right atrial pressure of 3 mmHg.   IAS/Shunts: The interatrial septum was not assessed.      LEFT VENTRICLE  PLAX 2D  LVIDd:         3.68 cm  Diastology  LVIDs:         2.74 cm  LV e' medial:    6.74 cm/s  LV PW:         1.13 cm  LV E/e' medial:  15.9  LV IVS:        0.89 cm  LV e' lateral:   10.80 cm/s  LVOT diam:     1.90 cm  LV E/e' lateral: 9.9  LV SV:         61  LV SV Index:   34  LVOT Area:     2.84 cm      RIGHT VENTRICLE  RV S prime:     8.81 cm/s  TAPSE (M-mode): 1.1 cm   LEFT ATRIUM             Index  RIGHT ATRIUM           Index  LA diam:        4.20 cm 2.29 cm/m  RA Area:     19.30 cm  LA Vol (A2C):   64.3 ml 35.09 ml/m RA Volume:   55.10 ml  30.07 ml/m  LA Vol (A4C):   60.1 ml 32.80 ml/m  LA Biplane Vol: 62.2 ml 33.95 ml/m   AORTIC VALVE  AV Area (Vmax):    0.58 cm  AV Area (Vmean):   0.54 cm  AV Area (VTI):     0.64 cm  AV Vmax:           413.00 cm/s  AV Vmean:          313.667 cm/s  AV VTI:            0.954 m  AV Peak Grad:      68.2 mmHg  AV Mean Grad:      43.0 mmHg  LVOT Vmax:         84.40 cm/s  LVOT Vmean:        59.967 cm/s  LVOT VTI:          0.217 m  LVOT/AV VTI ratio: 0.23     AORTA  Ao Root diam: 2.50 cm   MITRAL VALVE                TRICUSPID VALVE  MV Area (PHT): 3.46 cm     TR Peak grad:   31.8 mmHg  MV Decel Time: 219 msec     TR Vmax:        282.00 cm/s  MV E velocity: 107.00 cm/s  MV A velocity: 104.00 cm/s  SHUNTS  MV E/A ratio:  1.03         Systemic VTI:  0.22 m                              Systemic Diam: 1.90 cm   Dorris Carnes MD  Electronically signed by Dorris Carnes MD  Signature Date/Time: 04/28/2020/9:21:45 PM         Final         Physicians   Panel Physicians Referring Physician Case Authorizing Physician  Burnell Blanks, MD (Primary)          Procedures   INTRAVASCULAR PRESSURE WIRE/FFR STUDY  RIGHT/LEFT HEART CATH AND CORONARY/GRAFT ANGIOGRAPHY    Conclusion     Ost LM to LM  lesion is 99% stenosed. Ost 2nd Diag to 2nd Diag lesion is 40% stenosed. Mid LAD lesion is 30% stenosed. Prox LAD to Mid LAD lesion is 40% stenosed. Ost Cx to Prox Cx lesion is 100% stenosed. Mid RCA lesion is 30% stenosed. Prox RCA lesion is 80% stenosed. Ost RPDA to RPDA lesion is 90% stenosed. Dist RCA lesion is 50% stenosed. 2nd Mrg lesion is 50% stenosed. Mid LM to Prox LAD lesion is 95% stenosed. Mid Graft to Insertion lesion is 100% stenosed.   1. Severe left main stenosis 2. Severe ostial LAD stenosis. Patent Vein graft to the Diagonal branch that fills the LAD. The LIMA in injected and does not fill the LAD although it may just be atretic distally. It is not clear that the LIMA was anastomosed to the LAD. The operative note indicates the LIMA was used. 3. Chronic occlusion ostial Circumflex. Patent vein graft to the obtuse marginal branch with moderate disease  in the obtuse marginal branch. 4. The RCA is a large dominant vessel. There is a heavily calcified proximal stenosis that appears to be severe angiographically. RFR with flow wire was 0.89 suggesting the lesion is flow limiting. 5. Severe aortic stenosis (mean gradient 40.3 mmHg, peak to peak gradient 55 mmHg, AVA 0.75 cm2)   Recommendations: She will need to have staged PCI of the RCA with orbital atherectomy and stenting. I will plan this for next week. After the PCI, will then continue planning for TAVR with CT scans.         Indications   Severe aortic stenosis [I35.0 (ICD-10-CM)]  Coronary artery disease involving native coronary artery of native heart with unstable angina pectoris (HCC) [I25.110 (ICD-10-CM)]    Procedural Details   Technical Details Indication: 65 yo female with CAD s/p CABG and now severe aortic stenosis  Procedure: The risks, benefits, complications, treatment options, and expected outcomes were discussed with the patient. The patient and/or family concurred with the proposed plan, giving  informed consent. The patient was brought to the cath lab after IV hydration was given. The patient was sedated with Versed and Fentanyl. The right groin was prepped and draped in the usual manner. Using the modified Seldinger access technique, a 5 French sheath was placed in the right femoral artery and a 7 French sheath was placed in the right femoral vein using u/s guidance. Right heart catheterization performed using a balloon tipped catheter. Standard diagnostic catheters were used to perform selective coronary angiography. The JR4 was used to engage the native RCA and all grafts. The LIMA does not appear to have been used for bypass. The JR4 catheter was used to cross the aortic valve. LV pressures measured. No LV gram.  I elected to perform pressure wire assessment of the RCA proximal to mid lesion. She was given 10,000 units IV heparin. ACT over 300. The arterial sheath was upsized to a 6 [place] system. A JR4 guiding catheter was used to engage the RCA. I then passed the Pressure Wire X flow wire down the RCA. RFR 0.89 beyond the calcified stenosis. The wire and guiding catheter were removed.   There were no immediate complications. The patient was taken to the recovery area in stable condition.  Estimated blood loss <50 mL.   During this procedure medications were administered to achieve and maintain moderate conscious sedation while the patient's heart rate, blood pressure, and oxygen saturation were continuously monitored and I was present face-to-face 100% of this time.    Medications (Filter: Administrations occurring from 0829 to 0942 on 06/04/20)  (important)  Continuous medications are totaled by the amount administered until 06/04/20 0942.      Heparin (Porcine) in NaCl 1000-0.9 UT/500ML-% SOLN (mL) Total volume:  1,000 mL   Date/Time Rate/Dose/Volume Action    06/04/20 0834 500 mL Given    0834 500 mL Given      midazolam (VERSED) injection (mg) Total dose:  1 mg   Date/Time  Rate/Dose/Volume Action    06/04/20 0837 1 mg Given      fentaNYL (SUBLIMAZE) injection (mcg) Total dose:  25 mcg   Date/Time Rate/Dose/Volume Action    06/04/20 0837 25 mcg Given      lidocaine (PF) (XYLOCAINE) 1 % injection (mL) Total volume:  12 mL   Date/Time Rate/Dose/Volume Action    06/04/20 0844 12 mL Given      heparin sodium (porcine) injection (Units) Total dose:  10,000 Units   Date/Time Rate/Dose/Volume Action  06/04/20 0910 10,000 Units Given      iohexol (OMNIPAQUE) 350 MG/ML injection (mL) Total volume:  80 mL   Date/Time Rate/Dose/Volume Action    06/04/20 0930 80 mL Given      Sedation Time   Sedation Time Physician-1: 46 minutes 48 seconds     Radiation/Fluoro   Fluoro time: 10.5 (min) DAP: 22517 (mGycm2) Cumulative Air Kerma: 096 (mGy)     Complications     Complications documented before study signed (06/04/2020 10:03 AM)           Log Level Complications   None Documented by Burnell Blanks, MD 06/04/2020  9:44 AM  Date Found: 06/04/2020  Time Range: Intraprocedure      Coronary Findings     Diagnostic Dominance: Right   Left Main  Ost LM to LM lesion is 99% stenosed. The lesion is calcified.  Mid LM to Prox LAD lesion is 95% stenosed.  Left Anterior Descending  Vessel is large.  Prox LAD to Mid LAD lesion is 40% stenosed. The lesion is calcified.  Mid LAD lesion is 30% stenosed.  First Diagonal Branch  First Septal Branch  Vessel is small in size.  Second Diagonal Branch  Vessel is moderate in size.  Ost 2nd Diag to 2nd Diag lesion is 40% stenosed.  Second Septal Branch  Vessel is small in size.  Third Diagonal Branch  Vessel is small in size.  Third Septal Branch  Vessel is small in size.  Left Circumflex  Vessel is moderate in size.  Collaterals  Dist Cx filled by collaterals from Inf Sept.     Ost Cx to Prox Cx lesion is 100% stenosed. The lesion is chronically occluded.  Second Obtuse Marginal  Branch  2nd Mrg lesion is 50% stenosed.  Right Coronary Artery  Prox RCA lesion is 80% stenosed. The lesion is calcified.  Mid RCA lesion is 30% stenosed. The lesion is calcified.  Dist RCA lesion is 50% stenosed.  Right Posterior Descending Artery  Ost RPDA to RPDA lesion is 90% stenosed. The lesion is calcified.  Graft To 2nd Mrg  Graft To 1st Diag  LIMA Graft To Dist LAD  Mid Graft to Insertion lesion is 100% stenosed. The lesion is chronically occluded.    Intervention     No interventions have been documented.   Coronary Diagrams     Diagnostic Dominance: Right      Intervention       Implants         No implant documentation for this case.    Syngo Images    Show images for CARDIAC CATHETERIZATION   Images on Long Term Storage    Show images for Sachiko, Methot to Procedure Log   Procedure Log      Hemo Data   Flowsheet Row Most Recent Value  Fick Cardiac Output 5.03 L/min  Fick Cardiac Output Index 2.74 (L/min)/BSA  Aortic Mean Gradient 40.3 mmHg  Aortic Peak Gradient 55 mmHg  Aortic Valve Area 0.75  Aortic Value Area Index 0.41 cm2/BSA  RA A Wave 4 mmHg  RA V Wave 10 mmHg  RA Mean 7 mmHg  RV Systolic Pressure 58 mmHg  RV Diastolic Pressure 2 mmHg  RV EDP 8 mmHg  PA Systolic Pressure 52 mmHg  PA Diastolic Pressure 18 mmHg  PA Mean 31 mmHg  PW A Wave 8 mmHg  PW V Wave 4 mmHg  PW Mean 5 mmHg  AO Systolic Pressure  630 mmHg  AO Diastolic Pressure 54 mmHg  AO Mean 76 mmHg  LV Systolic Pressure 160 mmHg  LV Diastolic Pressure 5 mmHg  LV EDP 12 mmHg  AOp Systolic Pressure 109 mmHg  AOp Diastolic Pressure 57 mmHg  AOp Mean Pressure 83 mmHg  LVp Systolic Pressure 323 mmHg  LVp Diastolic Pressure 6 mmHg  LVp EDP Pressure 13 mmHg  QP/QS 1  TPVR Index 11.31 HRUI  TSVR Index 27.73 HRUI  PVR SVR Ratio 0.38  TPVR/TSVR Ratio 0.41      ADDENDUM REPORT: 06/26/2020 09:45   EXAM: OVER-READ INTERPRETATION  CT CHEST   The following  report is an over-read performed by radiologist Dr. Samara Snide [facility] on 06/26/2020. This over-read does not include interpretation of cardiac or coronary anatomy or pathology. The coronary CTA interpretation by the cardiologist is attached.   COMPARISON:  11/02/2019 chest CT.   FINDINGS: Please see the separate concurrent chest CT angiogram report for details.   IMPRESSION: Please see the separate concurrent chest CT angiogram report for details.     Electronically Signed   By: Ilona Sorrel M.D.   On: 06/26/2020 09:45    Addended by Sharyn Blitz, MD on 06/26/2020  9:47 AM      Study Result   Narrative & Impression  CLINICAL DATA:  Aortic Stenosis   EXAM: Cardiac TAVR CT   TECHNIQUE: The patient was scanned on a Siemens Force 557 slice scanner. A 120 kV retrospective scan was triggered in the ascending thoracic aorta at 140 HU's. Gantry rotation speed was 250 msecs and collimation was .6 mm. No beta blockade or nitro were given. The 3D data set was reconstructed in 5% intervals of the R-R cycle. Systolic and diastolic phases were analyzed on a dedicated work station using MPR, MIP and VRT modes. The patient received 80 cc of contrast.   FINDINGS: Aortic Valve: Tri leaflet severely calcified with restricted leaflet motion Calcium score 3013   Aorta: Severe calcific atherosclerosis with normal arch vessels no aneurysm   Sino-tubular Junction: 2.5 cm   Ascending Thoracic Aorta: 3.3 cm   Aortic Arch: 2.4 cm   Descending Thoracic Aorta: 2.3 cm   Sinus of Valsalva Measurements:   Non-coronary: 28.2 mm   Right - coronary: 28.7 mm   Left -   coronary: 30.3 mm   Coronary Artery Height above Annulus:   Left Main: 11.9 mm above annulus   Right Coronary: Somewhat shallow 9.6 mm above annulus   Virtual Basal Annulus Measurements:   Maximum / Minimum Diameter: 23.2 mm x 21.6 mm   Perimeter: 74 mm   Area: 417.8 mm2   Coronary  Arteries: Sufficient height above annulus for deployment although RCA a bit shallow There is a patent SVG to OM, patent SVG to D1. The LIMA   Is patent but seems atretic in its distal portion entering the distal LAD   Optimum Fluoroscopic Angle for Delivery: LAO 38 Cranial 11 degrees   IMPRESSION: 1. Tri leaflet AV severely calcified with restricted leaflet motion Calcium score 3013   2.  Annular area 417.8 mm2 suitable for a 23 mm Sapien 3 valve   3. Dense area of calcification at the base of the left cusp annulus protruding 5 mm into the annulus and extending subvalvular to mitral annulus increases risk of significant peri valvular regurgitation   4. Coronary arteries sufficient height above annulus for deployment although RCA a bit shallow at 9.6 mm. Patent SVG to OM,  Patent SVG to D1 and patent but   Atretic appearing LIMA to distal LAD   5. Optimum angiographic angle for deployment LAO 38 Cranial 11 degrees   Jenkins Rouge   Electronically Signed: By: Jenkins Rouge M.D. On: 06/24/2020 11:49      Narrative & Impression  CLINICAL DATA:  TAVR planning.  Severe aortic stenosis.   EXAM: CT ANGIOGRAPHY CHEST, ABDOMEN AND PELVIS   TECHNIQUE: Multidetector CT imaging through the chest, abdomen and pelvis was performed using the standard protocol during bolus administration of intravenous contrast. Multiplanar reconstructed images and MIPs were obtained and reviewed to evaluate the vascular anatomy.   CONTRAST:  120mL OMNIPAQUE IOHEXOL 350 MG/ML SOLN   COMPARISON:  11/02/2019 chest CT.   FINDINGS: CTA CHEST FINDINGS   Cardiovascular: Mild cardiomegaly. Diffuse thickening and coarse calcification of the aortic valve. No significant pericardial effusion/thickening. Three-vessel coronary atherosclerosis status post CABG. Atherosclerotic nonaneurysmal thoracic aorta. Normal caliber pulmonary arteries. No central pulmonary emboli.   Mediastinum/Nodes: No discrete  thyroid nodules. Unremarkable esophagus. No pathologically enlarged axillary, mediastinal or hilar lymph nodes.   Lungs/Pleura: No pneumothorax. No pleural effusion. Status post right upper lobectomy. Severe centrilobular emphysema with diffuse bronchial wall thickening. Clustered dense solid anterior left upper lobe pulmonary nodules, largest 5 mm (series 6/image 63), stable, presumably benign. No acute consolidative airspace disease, lung masses or new significant pulmonary nodules.   Musculoskeletal: No aggressive appearing focal osseous lesions. Intact sternotomy wires. Mild thoracic spondylosis.   CTA ABDOMEN AND PELVIS FINDINGS   Hepatobiliary: Normal liver with no liver mass. Cholecystectomy. Bile ducts are stable and within normal post cholecystectomy limits with CBD diameter 7 mm.   Pancreas: Normal, with no mass or duct dilation.   Spleen: Normal size. No mass.   Adrenals/Urinary Tract: Stable 1.0 cm right adrenal nodule with density -15 HU, compatible with a benign adenoma. No left adrenal nodules. No hydronephrosis. No contour deforming renal masses. Normal bladder.   Stomach/Bowel: Moderate hiatal hernia. Otherwise normal nondistended stomach. Normal caliber small bowel with no small bowel wall thickening. Normal appendix. Marked left colonic diverticulosis with no large bowel wall thickening or significant pericolonic fat stranding.   Vascular/Lymphatic: Atherosclerotic nonaneurysmal abdominal aorta. No pathologically enlarged lymph nodes in the abdomen or pelvis.   Reproductive: Small coarsely calcified uterine fibroids, largest 1.1 cm anteriorly in the right fundal uterus. No adnexal mass.   Other: No pneumoperitoneum, ascites or focal fluid collection. Small to moderate fat containing supraumbilical ventral abdominal hernia in the midline high abdominal wall.   Musculoskeletal: No aggressive appearing focal osseous lesions. Mild-to-moderate lumbar  spondylosis.   VASCULAR MEASUREMENTS PERTINENT TO TAVR:   AORTA:   Minimal Aortic Diameter-10.7 x 9.9 mm   Severity of Aortic Calcification-severe   RIGHT PELVIS:   Right Common Iliac Artery -   Minimal Diameter-8.9 x 6.9 mm   Tortuosity-mild   Calcification-moderate to severe   Right External Iliac Artery -   Minimal Diameter-6.0 x 5.8 mm   Tortuosity-mild-to-moderate   Calcification-none   Right Common Femoral Artery -   Minimal Diameter-4.2 x 3.9 mm   Tortuosity-mild   Calcification-severe   LEFT PELVIS:   Left Common Iliac Artery -   Minimal Diameter-8.2 x 6.1 mm   Tortuosity-mild   Calcification-severe   Left External Iliac Artery -   Minimal Diameter-6.2 x 6.0 mm   Tortuosity-mild   Calcification-none   Left Common Femoral Artery -   Minimal Diameter-4.6 x 3.8 mm   Tortuosity-mild   Calcification-moderate  to severe   Review of the MIP images confirms the above findings.   IMPRESSION: 1. Vascular findings and measurements pertinent to potential TAVR procedure, as detailed. 2. Diffusely thickened and coarsely calcified aortic valve, compatible with the reported history of severe aortic stenosis. 3. Mild cardiomegaly. 4. Severe centrilobular emphysema with diffuse bronchial wall thickening, suggesting COPD. 5. Moderate hiatal hernia. 6. Marked left colonic diverticulosis. 7. Stable right adrenal adenoma. 8. Small to moderate fat containing supraumbilical ventral abdominal hernia. 9. Aortic Atherosclerosis (ICD10-I70.0) and Emphysema (ICD10-J43.9).     Electronically Signed   By: Ilona Sorrel M.D.   On: 06/26/2020 09:26      STS Risk Score: Procedure: Isolated AVR Risk of Mortality: 4.604% Renal Failure: 3.967% Permanent Stroke: 4.812% Prolonged Ventilation: 13.248% DSW Infection: 0.140% Reoperation: 3.774% Morbidity or Mortality: 20.673% Short Length of Stay: 32.123% Long Length of Stay: 7.589%      Impression:   This 65 year old woman has stage D, severe, symptomatic aortic stenosis with near Heart Association class II-III symptoms of exertional fatigue and shortness of breath consistent with chronic diastolic congestive heart failure.  She recently underwent PCI of a high-grade right coronary stenosis with a significant improvement in her symptoms.  I have personally reviewed her 2D echocardiogram, cardiac catheterization, and CTA studies.  Her 2D echocardiogram shows a trileaflet aortic valve with thickening and calcification causing restricted leaflet mobility.  The mean gradient is 43 mmHg with a dimensionless index of 0.23 consistent with severe aortic stenosis.  Left ventricular systolic function is normal.  Cardiac catheterization shows severe native left main and three-vessel coronary disease with patent vein grafts and a atretic mammary to the LAD that is filling by collaterals from the diagonal vein graft.  She underwent successful PCI and stenting of a large dominant RCA.  The mean gradient across aortic valve was measured at 40.3 mmHg the peak to peak gradient of 55 mmHg.  I agree that aortic valve replacement is indicated in this patient for relief of her symptoms and to prevent progressive left ventricular deterioration.  With her prior coronary bypass surgery and ascending aortic graft up to the right subclavian and right common carotid arteries and complex medical history I do not think she would be a candidate for third time redo sternotomy for aortic valve replacement.  I think transcatheter aortic valve replacement be the best option for her.  Her gated cardiac CTA shows anatomy suitable for TAVR using a SAPIEN 3 valve.  Her abdominal and pelvic CTA shows adequate pelvic vascular anatomy to allow transfemoral insertion.   The patient was counseled at length regarding treatment alternatives for management of severe symptomatic aortic stenosis. The risks and benefits of surgical  intervention has been discussed in detail. Long-term prognosis with medical therapy was discussed. Alternative approaches such as conventional surgical aortic valve replacement, transcatheter aortic valve replacement, and palliative medical therapy were compared and contrasted at length. This discussion was placed in the context of the patient's own specific clinical presentation and past medical history. All of her questions have been addressed.   Following the decision to proceed with transcatheter aortic valve replacement, a discussion was held regarding what types of management strategies would be attempted intraoperatively in the event of life-threatening complications, including whether or not the patient would be considered a candidate for the use of cardiopulmonary bypass and/or conversion to open sternotomy for attempted surgical intervention.  I do not think she is a candidate for emergent sternotomy to manage any intraoperative complications since  she has had 2 previous sternotomies and it would be difficult and time-consuming to reenter her chest again.  I discussed this with her.  The patient is aware of the fact that transient use of cardiopulmonary bypass may be necessary. The patient has been advised of a variety of complications that might develop including but not limited to risks of death, stroke, paravalvular leak, aortic dissection or other major vascular complications, aortic annulus rupture, device embolization, cardiac rupture or perforation, mitral regurgitation, acute myocardial infarction, arrhythmia, heart block or bradycardia requiring permanent pacemaker placement, congestive heart failure, respiratory failure, renal failure, pneumonia, infection, other late complications related to structural valve deterioration or migration, or other complications that might ultimately cause a temporary or permanent loss of functional independence or other long term morbidity. The patient provides  full informed consent for the procedure as described and all questions were answered.       Plan:   Transfemoral transcatheter aortic valve replacement.       Gaye Pollack, MD

## 2020-08-12 ENCOUNTER — Encounter (HOSPITAL_COMMUNITY): Payer: Self-pay | Admitting: Cardiovascular Disease

## 2020-08-12 ENCOUNTER — Inpatient Hospital Stay (HOSPITAL_COMMUNITY)
Admission: RE | Admit: 2020-08-12 | Discharge: 2020-08-14 | DRG: 267 | Disposition: A | Discharge status: Home / Self Care | Payer: Medicare HMO | Attending: Cardiovascular Disease | Admitting: Cardiovascular Disease

## 2020-08-12 ENCOUNTER — Inpatient Hospital Stay (HOSPITAL_COMMUNITY): Payer: Medicare HMO

## 2020-08-12 ENCOUNTER — Other Ambulatory Visit: Payer: Self-pay | Admitting: Physician Assistant

## 2020-08-12 ENCOUNTER — Encounter (HOSPITAL_COMMUNITY)
Admission: RE | Disposition: A | Discharge status: Home / Self Care | Payer: Medicare HMO | Source: Home / Self Care | Attending: Cardiovascular Disease

## 2020-08-12 ENCOUNTER — Inpatient Hospital Stay (HOSPITAL_COMMUNITY): Payer: Medicare HMO | Admitting: Certified Registered Nurse Anesthetist

## 2020-08-12 ENCOUNTER — Encounter: Payer: Self-pay | Admitting: Physician Assistant

## 2020-08-12 ENCOUNTER — Inpatient Hospital Stay (HOSPITAL_COMMUNITY): Payer: Medicare HMO | Admitting: Vascular Surgery

## 2020-08-12 ENCOUNTER — Other Ambulatory Visit: Payer: Self-pay

## 2020-08-12 DIAGNOSIS — Z7902 Long term (current) use of antithrombotics/antiplatelets: Secondary | ICD-10-CM | POA: Diagnosis not present

## 2020-08-12 DIAGNOSIS — Z902 Acquired absence of lung [part of]: Secondary | ICD-10-CM

## 2020-08-12 DIAGNOSIS — R509 Fever, unspecified: Secondary | ICD-10-CM

## 2020-08-12 DIAGNOSIS — Z951 Presence of aortocoronary bypass graft: Secondary | ICD-10-CM | POA: Diagnosis not present

## 2020-08-12 DIAGNOSIS — I252 Old myocardial infarction: Secondary | ICD-10-CM | POA: Diagnosis not present

## 2020-08-12 DIAGNOSIS — Z87891 Personal history of nicotine dependence: Secondary | ICD-10-CM

## 2020-08-12 DIAGNOSIS — Z9049 Acquired absence of other specified parts of digestive tract: Secondary | ICD-10-CM | POA: Diagnosis not present

## 2020-08-12 DIAGNOSIS — I25110 Atherosclerotic heart disease of native coronary artery with unstable angina pectoris: Secondary | ICD-10-CM | POA: Diagnosis present

## 2020-08-12 DIAGNOSIS — I70208 Unspecified atherosclerosis of native arteries of extremities, other extremity: Secondary | ICD-10-CM | POA: Diagnosis present

## 2020-08-12 DIAGNOSIS — J9601 Acute respiratory failure with hypoxia: Secondary | ICD-10-CM | POA: Diagnosis not present

## 2020-08-12 DIAGNOSIS — Z8673 Personal history of transient ischemic attack (TIA), and cerebral infarction without residual deficits: Secondary | ICD-10-CM

## 2020-08-12 DIAGNOSIS — Z952 Presence of prosthetic heart valve: Secondary | ICD-10-CM

## 2020-08-12 DIAGNOSIS — Z85118 Personal history of other malignant neoplasm of bronchus and lung: Secondary | ICD-10-CM | POA: Diagnosis not present

## 2020-08-12 DIAGNOSIS — J9611 Chronic respiratory failure with hypoxia: Secondary | ICD-10-CM | POA: Diagnosis present

## 2020-08-12 DIAGNOSIS — I5032 Chronic diastolic (congestive) heart failure: Secondary | ICD-10-CM | POA: Diagnosis present

## 2020-08-12 DIAGNOSIS — I6529 Occlusion and stenosis of unspecified carotid artery: Secondary | ICD-10-CM | POA: Diagnosis present

## 2020-08-12 DIAGNOSIS — J449 Chronic obstructive pulmonary disease, unspecified: Secondary | ICD-10-CM | POA: Insufficient documentation

## 2020-08-12 DIAGNOSIS — I083 Combined rheumatic disorders of mitral, aortic and tricuspid valves: Secondary | ICD-10-CM | POA: Diagnosis present

## 2020-08-12 DIAGNOSIS — I350 Nonrheumatic aortic (valve) stenosis: Secondary | ICD-10-CM

## 2020-08-12 DIAGNOSIS — F419 Anxiety disorder, unspecified: Secondary | ICD-10-CM | POA: Diagnosis present

## 2020-08-12 DIAGNOSIS — K219 Gastro-esophageal reflux disease without esophagitis: Secondary | ICD-10-CM | POA: Diagnosis present

## 2020-08-12 DIAGNOSIS — Z7952 Long term (current) use of systemic steroids: Secondary | ICD-10-CM

## 2020-08-12 DIAGNOSIS — Z006 Encounter for examination for normal comparison and control in clinical research program: Secondary | ICD-10-CM | POA: Diagnosis not present

## 2020-08-12 DIAGNOSIS — Z79899 Other long term (current) drug therapy: Secondary | ICD-10-CM

## 2020-08-12 DIAGNOSIS — I2510 Atherosclerotic heart disease of native coronary artery without angina pectoris: Secondary | ICD-10-CM | POA: Diagnosis present

## 2020-08-12 DIAGNOSIS — Z8249 Family history of ischemic heart disease and other diseases of the circulatory system: Secondary | ICD-10-CM | POA: Diagnosis not present

## 2020-08-12 DIAGNOSIS — Z7982 Long term (current) use of aspirin: Secondary | ICD-10-CM

## 2020-08-12 DIAGNOSIS — I110 Hypertensive heart disease with heart failure: Secondary | ICD-10-CM | POA: Diagnosis present

## 2020-08-12 DIAGNOSIS — Z955 Presence of coronary angioplasty implant and graft: Secondary | ICD-10-CM | POA: Diagnosis not present

## 2020-08-12 DIAGNOSIS — E876 Hypokalemia: Secondary | ICD-10-CM | POA: Diagnosis not present

## 2020-08-12 HISTORY — PX: TEE WITHOUT CARDIOVERSION: SHX5443

## 2020-08-12 HISTORY — DX: Presence of prosthetic heart valve: Z95.2

## 2020-08-12 HISTORY — PX: TRANSCATHETER AORTIC VALVE REPLACEMENT, TRANSFEMORAL: SHX6400

## 2020-08-12 HISTORY — DX: Personal history of transient ischemic attack (TIA), and cerebral infarction without residual deficits: Z86.73

## 2020-08-12 LAB — POCT I-STAT, CHEM 8
BUN: 12 mg/dL (ref 8–23)
BUN: 11 mg/dL (ref 8–23)
Calcium, Ion: 1.26 mmol/L (ref 1.15–1.40)
Calcium, Ion: 1.28 mmol/L (ref 1.15–1.40)
Chloride: 100 mmol/L (ref 98–111)
Chloride: 103 mmol/L (ref 98–111)
Creatinine, Ser: 0.6 mg/dL (ref 0.44–1.00)
Creatinine, Ser: 0.5 mg/dL (ref 0.44–1.00)
Glucose, Bld: 118 mg/dL — ABNORMAL HIGH (ref 70–99)
Glucose, Bld: 100 mg/dL — ABNORMAL HIGH (ref 70–99)
HCT: 32 % — ABNORMAL LOW (ref 36.0–46.0)
HCT: 32 % — ABNORMAL LOW (ref 36.0–46.0)
Hemoglobin: 10.9 g/dL — ABNORMAL LOW (ref 12.0–15.0)
Hemoglobin: 10.9 g/dL — ABNORMAL LOW (ref 12.0–15.0)
Potassium: 4.3 mmol/L (ref 3.5–5.1)
Potassium: 4.1 mmol/L (ref 3.5–5.1)
Sodium: 140 mmol/L (ref 135–145)
Sodium: 140 mmol/L (ref 135–145)
TCO2: 31 mmol/L (ref 22–32)
TCO2: 28 mmol/L (ref 22–32)

## 2020-08-12 LAB — POCT I-STAT 7, (LYTES, BLD GAS, ICA,H+H)
Acid-Base Excess: 1 mmol/L (ref 0.0–2.0)
Bicarbonate: 31.5 mmol/L — ABNORMAL HIGH (ref 20.0–28.0)
Calcium, Ion: 1.26 mmol/L (ref 1.15–1.40)
HCT: 33 % — ABNORMAL LOW (ref 36.0–46.0)
Hemoglobin: 11.2 g/dL — ABNORMAL LOW (ref 12.0–15.0)
O2 Saturation: 98 %
Potassium: 4.1 mmol/L (ref 3.5–5.1)
Sodium: 141 mmol/L (ref 135–145)
TCO2: 34 mmol/L — ABNORMAL HIGH (ref 22–32)
pCO2 arterial: 85.2 mmHg (ref 32.0–48.0)
pH, Arterial: 7.175 — CL (ref 7.350–7.450)
pO2, Arterial: 133 mmHg — ABNORMAL HIGH (ref 83.0–108.0)

## 2020-08-12 LAB — ECHOCARDIOGRAM LIMITED
AR max vel: 0.76 cm2
AV Area VTI: 0.72 cm2
AV Area mean vel: 0.71 cm2
AV Mean grad: 39 mmHg
AV Peak grad: 65.8 mmHg
Ao pk vel: 4.06 m/s
Calc EF: 61.4 %
Single Plane A2C EF: 60.4 %
Single Plane A4C EF: 62.6 %

## 2020-08-12 LAB — GLUCOSE, CAPILLARY: Glucose-Capillary: 103 mg/dL — ABNORMAL HIGH (ref 70–99)

## 2020-08-12 SURGERY — IMPLANTATION, AORTIC VALVE, TRANSCATHETER, FEMORAL APPROACH
Anesthesia: Monitor Anesthesia Care

## 2020-08-12 MED ORDER — HEPARIN (PORCINE) IN NACL 1000-0.9 UT/500ML-% IV SOLN
INTRAVENOUS | Status: AC
  Filled 2020-08-12: qty 1000

## 2020-08-12 MED ORDER — TRAMADOL HCL 50 MG PO TABS: 50.0000 mg | ORAL_TABLET | ORAL | Status: DC | PRN

## 2020-08-12 MED ORDER — PANTOPRAZOLE SODIUM 40 MG PO TBEC
40.0000 mg | DELAYED_RELEASE_TABLET | Freq: Every morning | ORAL | Status: DC
  Administered 2020-08-13 – 2020-08-14 (×2): 40 mg via ORAL
  Filled 2020-08-12 (×2): qty 1

## 2020-08-12 MED ORDER — HEPARIN (PORCINE) IN NACL 1000-0.9 UT/500ML-% IV SOLN
INTRAVENOUS | Status: AC
  Filled 2020-08-12: qty 500

## 2020-08-12 MED ORDER — VANCOMYCIN HCL 1250 MG/250ML IV SOLN
1250.0000 mg | Freq: Once | INTRAVENOUS | Status: AC
  Administered 2020-08-12: 1250 mg via INTRAVENOUS
  Filled 2020-08-12: qty 250

## 2020-08-12 MED ORDER — CLOPIDOGREL BISULFATE 75 MG PO TABS
75.0000 mg | ORAL_TABLET | Freq: Every day | ORAL | Status: DC
  Administered 2020-08-12 – 2020-08-14 (×3): 75 mg via ORAL
  Filled 2020-08-12 (×3): qty 1

## 2020-08-12 MED ORDER — SODIUM CHLORIDE 0.9 % IV SOLN: INTRAVENOUS | Status: DC

## 2020-08-12 MED ORDER — FENTANYL CITRATE (PF) 250 MCG/5ML IJ SOLN
INTRAMUSCULAR | Status: DC | PRN
  Administered 2020-08-12 (×2): 50 ug via INTRAVENOUS

## 2020-08-12 MED ORDER — SODIUM CHLORIDE 0.9 % IV SOLN: 250.0000 mL | INTRAVENOUS | Status: DC | PRN

## 2020-08-12 MED ORDER — LIDOCAINE HCL (PF) 1 % IJ SOLN
INTRAMUSCULAR | Status: AC
  Filled 2020-08-12: qty 30

## 2020-08-12 MED ORDER — ACETAMINOPHEN 325 MG PO TABS
650.0000 mg | ORAL_TABLET | Freq: Four times a day (QID) | ORAL | Status: DC | PRN
  Administered 2020-08-12: 650 mg via ORAL
  Filled 2020-08-12: qty 2

## 2020-08-12 MED ORDER — MORPHINE SULFATE (PF) 2 MG/ML IV SOLN: 1.0000 mg | INTRAVENOUS | Status: DC | PRN

## 2020-08-12 MED ORDER — LACTATED RINGERS IV SOLN: INTRAVENOUS | Status: DC

## 2020-08-12 MED ORDER — ACETAMINOPHEN 650 MG RE SUPP: 650.0000 mg | Freq: Four times a day (QID) | RECTAL | Status: DC | PRN

## 2020-08-12 MED ORDER — LIDOCAINE HCL (PF) 1 % IJ SOLN
INTRAMUSCULAR | Status: DC | PRN
  Administered 2020-08-12: 5 mL via INTRADERMAL

## 2020-08-12 MED ORDER — PHENYLEPHRINE HCL-NACL 20-0.9 MG/250ML-% IV SOLN
0.0000 ug/min | INTRAVENOUS | Status: DC
  Filled 2020-08-12: qty 250

## 2020-08-12 MED ORDER — HEPARIN SODIUM (PORCINE) 1000 UNIT/ML IJ SOLN
INTRAMUSCULAR | Status: DC | PRN
  Administered 2020-08-12: 10000 [IU] via INTRAVENOUS

## 2020-08-12 MED ORDER — PROPOFOL 500 MG/50ML IV EMUL
INTRAVENOUS | Status: DC | PRN
  Administered 2020-08-12: 25 ug/kg/min via INTRAVENOUS

## 2020-08-12 MED ORDER — OXYCODONE HCL 5 MG PO TABS
5.0000 mg | ORAL_TABLET | ORAL | Status: DC | PRN
  Administered 2020-08-12 – 2020-08-13 (×2): 10 mg via ORAL
  Administered 2020-08-13 (×2): 5 mg via ORAL
  Administered 2020-08-14: 10 mg via ORAL
  Filled 2020-08-12: qty 1
  Filled 2020-08-12 (×2): qty 2
  Filled 2020-08-12: qty 1
  Filled 2020-08-12: qty 2

## 2020-08-12 MED ORDER — ATORVASTATIN CALCIUM 40 MG PO TABS
40.0000 mg | ORAL_TABLET | Freq: Every day | ORAL | Status: DC
  Administered 2020-08-12 – 2020-08-14 (×3): 40 mg via ORAL
  Filled 2020-08-12 (×3): qty 1

## 2020-08-12 MED ORDER — MIDAZOLAM HCL 2 MG/2ML IJ SOLN
INTRAMUSCULAR | Status: AC
  Filled 2020-08-12: qty 2

## 2020-08-12 MED ORDER — MIDAZOLAM HCL 2 MG/2ML IJ SOLN
INTRAMUSCULAR | Status: DC | PRN
  Administered 2020-08-12 (×2): 1 mg via INTRAVENOUS

## 2020-08-12 MED ORDER — ALPRAZOLAM 0.5 MG PO TABS
0.5000 mg | ORAL_TABLET | Freq: Three times a day (TID) | ORAL | Status: DC
  Administered 2020-08-12 – 2020-08-14 (×6): 0.5 mg via ORAL
  Filled 2020-08-12 (×6): qty 1

## 2020-08-12 MED ORDER — CHLORHEXIDINE GLUCONATE 4 % EX LIQD: 30.0000 mL | CUTANEOUS | Status: DC

## 2020-08-12 MED ORDER — CHLORHEXIDINE GLUCONATE 4 % EX LIQD: 60.0000 mL | Freq: Once | CUTANEOUS | Status: DC

## 2020-08-12 MED ORDER — ONDANSETRON HCL 4 MG/2ML IJ SOLN
INTRAMUSCULAR | Status: DC | PRN
  Administered 2020-08-12: 4 mg via INTRAVENOUS

## 2020-08-12 MED ORDER — SODIUM CHLORIDE 0.9% FLUSH
3.0000 mL | Freq: Two times a day (BID) | INTRAVENOUS | Status: DC
Start: 2020-08-12 — End: 2020-08-14
  Administered 2020-08-12 – 2020-08-14 (×3): 3 mL via INTRAVENOUS

## 2020-08-12 MED ORDER — CHLORHEXIDINE GLUCONATE 0.12 % MT SOLN
15.0000 mL | Freq: Once | OROMUCOSAL | Status: AC
  Administered 2020-08-12: 15 mL via OROMUCOSAL
  Filled 2020-08-12: qty 15

## 2020-08-12 MED ORDER — ONDANSETRON HCL 4 MG/2ML IJ SOLN
4.0000 mg | Freq: Four times a day (QID) | INTRAMUSCULAR | Status: DC | PRN
  Administered 2020-08-13: 4 mg via INTRAVENOUS
  Filled 2020-08-12 (×2): qty 2

## 2020-08-12 MED ORDER — ASPIRIN EC 81 MG PO TBEC
81.0000 mg | DELAYED_RELEASE_TABLET | Freq: Every day | ORAL | Status: DC
  Administered 2020-08-13 – 2020-08-14 (×2): 81 mg via ORAL
  Filled 2020-08-12 (×2): qty 1

## 2020-08-12 MED ORDER — NITROGLYCERIN IN D5W 200-5 MCG/ML-% IV SOLN: 0.0000 ug/min | INTRAVENOUS | Status: DC

## 2020-08-12 MED ORDER — CEFAZOLIN SODIUM-DEXTROSE 2-4 GM/100ML-% IV SOLN
2.0000 g | Freq: Three times a day (TID) | INTRAVENOUS | Status: DC
  Filled 2020-08-12: qty 100

## 2020-08-12 MED ORDER — ORAL CARE MOUTH RINSE
15.0000 mL | Freq: Two times a day (BID) | OROMUCOSAL | Status: DC
  Administered 2020-08-12: 15 mL via OROMUCOSAL

## 2020-08-12 MED ORDER — ISOSORBIDE MONONITRATE ER 30 MG PO TB24
15.0000 mg | ORAL_TABLET | Freq: Every day | ORAL | Status: DC
  Administered 2020-08-12 – 2020-08-14 (×3): 15 mg via ORAL
  Filled 2020-08-12 (×3): qty 1

## 2020-08-12 MED ORDER — ARFORMOTEROL TARTRATE 15 MCG/2ML IN NEBU
15.0000 ug | INHALATION_SOLUTION | Freq: Two times a day (BID) | RESPIRATORY_TRACT | Status: DC
  Administered 2020-08-12 – 2020-08-14 (×4): 15 ug via RESPIRATORY_TRACT
  Filled 2020-08-12 (×4): qty 2

## 2020-08-12 MED ORDER — IOHEXOL 350 MG/ML SOLN
INTRAVENOUS | Status: DC | PRN
  Administered 2020-08-12: 40 mL

## 2020-08-12 MED ORDER — ALBUTEROL SULFATE (2.5 MG/3ML) 0.083% IN NEBU: 2.5000 mg | INHALATION_SOLUTION | RESPIRATORY_TRACT | Status: DC | PRN

## 2020-08-12 MED ORDER — FENTANYL CITRATE (PF) 100 MCG/2ML IJ SOLN
INTRAMUSCULAR | Status: AC
  Filled 2020-08-12: qty 2

## 2020-08-12 MED ORDER — SODIUM CHLORIDE 0.9% FLUSH: 3.0000 mL | INTRAVENOUS | Status: DC | PRN

## 2020-08-12 MED ORDER — HEPARIN (PORCINE) IN NACL 1000-0.9 UT/500ML-% IV SOLN
INTRAVENOUS | Status: DC | PRN
  Administered 2020-08-12 (×3): 500 mL

## 2020-08-12 MED ORDER — PROTAMINE SULFATE 10 MG/ML IV SOLN
INTRAVENOUS | Status: DC | PRN
  Administered 2020-08-12: 30 mg via INTRAVENOUS
  Administered 2020-08-12: 70 mg via INTRAVENOUS

## 2020-08-12 MED ORDER — UMECLIDINIUM BROMIDE 62.5 MCG/INH IN AEPB
1.0000 | INHALATION_SPRAY | Freq: Every day | RESPIRATORY_TRACT | Status: DC
  Administered 2020-08-14: 1 via RESPIRATORY_TRACT
  Filled 2020-08-12: qty 7

## 2020-08-12 SURGICAL SUPPLY — 34 items
BAG SNAP BAND KOVER 36X36 (MISCELLANEOUS) ×6 IMPLANT
BLANKET WARM UNDERBOD FULL ACC (MISCELLANEOUS) ×6 IMPLANT
CABLE ADAPT PACING TEMP 12FT (ADAPTER) ×3 IMPLANT
CATH 23 ULTRA DELIVERY (CATHETERS) ×3 IMPLANT
CATH DIAG 6FR PIGTAIL ANGLED (CATHETERS) ×6 IMPLANT
CATH INFINITI 6F AL1 (CATHETERS) ×3 IMPLANT
CATH S G BIP PACING (CATHETERS) ×3 IMPLANT
CLOSURE MYNX CONTROL 6F/7F (Vascular Products) ×3 IMPLANT
CLOSURE PERCLOSE PROSTYLE (VASCULAR PRODUCTS) ×6 IMPLANT
CRIMPER (MISCELLANEOUS) ×3 IMPLANT
DEVICE INFLATION ATRION QL2530 (MISCELLANEOUS) ×3 IMPLANT
ELECT DEFIB PAD ADLT CADENCE (PAD) ×3 IMPLANT
GUIDEWIRE SAFE TJ AMPLATZ EXST (WIRE) ×3 IMPLANT
KIT HEART LEFT (KITS) ×3 IMPLANT
KIT MICROPUNCTURE NIT STIFF (SHEATH) ×3 IMPLANT
PACK CARDIAC CATHETERIZATION (CUSTOM PROCEDURE TRAY) ×3 IMPLANT
SHEATH BRITE TIP 7FR 35CM (SHEATH) ×3 IMPLANT
SHEATH EDWARDS INTRO SET 14X36 (SHEATH) ×3 IMPLANT
SHEATH PINNACLE 6F 10CM (SHEATH) ×3 IMPLANT
SHEATH PINNACLE 8F 10CM (SHEATH) ×3 IMPLANT
SHEATH PROBE COVER 6X72 (BAG) ×3 IMPLANT
SLEEVE REPOSITIONING LENGTH 30 (MISCELLANEOUS) ×3 IMPLANT
STOPCOCK MORSE 400PSI 3WAY (MISCELLANEOUS) ×3 IMPLANT
SYR MEDRAD MARK V 150ML (SYRINGE) ×3 IMPLANT
TRANSDUCER W/STOPCOCK (MISCELLANEOUS) ×6 IMPLANT
TUBING ART PRESS 72  MALE/FEM (TUBING) ×2
TUBING ART PRESS 72 MALE/FEM (TUBING) ×1 IMPLANT
TUBING CIL FLEX 10 FLL-RA (TUBING) ×3 IMPLANT
TUBING CONTRAST HIGH PRESS 48 (TUBING) ×3 IMPLANT
VALVE 23 ULTRA SAPIEN KIT (Valve) ×3 IMPLANT
WIRE AMPLATZ SS-J .035X180CM (WIRE) ×3 IMPLANT
WIRE EMERALD 3MM-J .035X150CM (WIRE) ×3 IMPLANT
WIRE EMERALD 3MM-J .035X260CM (WIRE) ×3 IMPLANT
WIRE EMERALD ST .035X260CM (WIRE) ×3 IMPLANT

## 2020-08-12 NOTE — Anesthesia Postprocedure Evaluation (Signed)
Anesthesia Post Note  [name]  Procedure(s) Performed: TRANSCATHETER AORTIC VALVE REPLACEMENT, TRANSFEMORAL TRANSESOPHAGEAL ECHOCARDIOGRAM (TEE)     Patient location during evaluation: Cath Lab Anesthesia Type: MAC Level of consciousness: awake Pain management: pain level controlled Vital Signs Assessment: post-procedure vital signs reviewed and stable Respiratory status: spontaneous breathing, nonlabored ventilation, respiratory function stable and patient connected to nasal cannula oxygen Cardiovascular status: stable and blood pressure returned to baseline Postop Assessment: no apparent nausea or vomiting Anesthetic complications: no   No notable events documented.  Last Vitals:  Vitals:   08/12/20 1400 08/12/20 1430  BP: 130/64 127/65  Pulse: 71 76  Resp: 19 17  Temp:    SpO2: 92%     Last Pain:  Vitals:   08/12/20 1412  TempSrc:   PainSc: Asleep                 Nathanyel Defenbaugh P Fontella Shan

## 2020-08-12 NOTE — Transfer of Care (Signed)
Immediate Anesthesia Transfer of Care Note  [name]  Procedure(s) Performed: TRANSCATHETER AORTIC VALVE REPLACEMENT, TRANSFEMORAL TRANSESOPHAGEAL ECHOCARDIOGRAM (TEE)  Patient Location: PACU  Anesthesia Type:General  Level of Consciousness: drowsy and patient cooperative  Airway & Oxygen Therapy: Patient Spontanous Breathing and Patient connected to face mask oxygen  Post-op Assessment: Report given to RN and Post -op Vital signs reviewed and stable  Post vital signs: Reviewed and stable  Last Vitals:  Vitals Value Taken Time  BP 146/61 08/12/20 1136  Temp 36.5 C 08/12/20 1123  Pulse 68 08/12/20 1136  Resp 15 08/12/20 1136  SpO2 96 % 08/12/20 1136  Vitals shown include unvalidated device data.  Last Pain:  Vitals:   08/12/20 1123  TempSrc: Temporal  PainSc: Asleep      Patients Stated Pain Goal: 0 (25/18/98 4210)  Complications: No notable events documented.

## 2020-08-12 NOTE — CV Procedure (Signed)
[facility]  TAVR OPERATIVE NOTE   Date of Procedure:  08/12/2020  Preoperative Diagnosis: Severe Aortic Stenosis   Postoperative Diagnosis: Same   Procedure:   Transcatheter Aortic Valve Replacement - Transfemoral Approach  Edwards Sapien 3 THV (size 23 mm, model # [id number], serial # [id number])  Co-Surgeons:  Lauree Chandler, MD and Gaye Pollack, MD   Anesthesiologist:  Ellender  Echocardiographer:  Croitoru  Pre-operative Echo Findings: Severe aortic stenosis Normal left ventricular systolic function  Post-operative Echo Findings: No paravalvular leak Normal left ventricular systolic function  BRIEF CLINICAL NOTE AND INDICATIONS FOR SURGERY  65 yo female with history of CAD s/p CABG, COPD, prior stroke, PVD, GERD and severe aortic stenosis who is here today for TAVR. She had a 3 vessel CABG in October 2018 after presenting with a NSTEMI. She has carotid artery disease and has undergone right carotid endarterectomy in 2016 and right subclavian/right carotid bypass in 2019. She has been followed for aortic stenosis. She reported more dyspnea on exertion when she was seen by Dr. Harl Bowie in March 2022. Echo 04/28/20 with LVEF=55-60%. The aortic valve leaflets are thickened and calcified with limited leaflet excursion. Mean gradient 43 mm Hg, peak gradient 68 mmHg, AVA 0.54 cm2, dimensionless index 0.23, SVI 34. This is consistent with severe aortic stenosis. Cardiac cath 06/04/20 with severe left main stenosis. The diagonal and LAD fills from the patent vein graft to the diagonal. The LIMA is atretic. The chronically occluded Circumflex fills from the patent vein graft. The RCA had a severe, heavily calcified stenosis. Severe AS by cath with mean gradient 40.3 mmHg, peak to peak gradient 55 mmHg, AVA 0.75 cm2). She was brought back 06/12/20 for staged PCI of the RCA. The RCA was treated with orbital atherectomy and a drug eluting stent was placed in the proximal RCA.  She is s/p right upper lobectomy in 2020 for adenocarcinoma of the right upper lobe. She was admitted to [facility] September 2021 with acute respiratory failure secondary to pneumonia.    During the course of the patient's preoperative work up they have been evaluated comprehensively by a multidisciplinary team of specialists coordinated through the [facility] in the [facility].  They have been demonstrated to suffer from symptomatic severe aortic stenosis as noted above. The patient has been counseled extensively as to the relative risks and benefits of all options for the treatment of severe aortic stenosis including long term medical therapy, conventional surgery for aortic valve replacement, and transcatheter aortic valve replacement.  The patient has been independently evaluated by Dr. Cyndia Bent with CT surgery and they are felt to be at high risk for conventional surgical aortic valve replacement. The surgeon indicated the patient would be a poor candidate for conventional surgery. Based upon review of all of the patient's preoperative diagnostic tests they are felt to be candidate for transcatheter aortic valve replacement using the transfemoral approach as an alternative to high risk conventional surgery.    Following the decision to proceed with transcatheter aortic valve replacement, a discussion has been held regarding what types of management strategies would be attempted intraoperatively in the event of life-threatening complications, including whether or not the patient would be considered a candidate for the use of cardiopulmonary bypass and/or conversion to open sternotomy for attempted surgical intervention.  The patient has been advised of a variety of complications that might develop peculiar to this approach including but not limited to risks of death, stroke,  paravalvular leak, aortic dissection or other major vascular complications, aortic  annulus rupture, device embolization, cardiac rupture or perforation, acute myocardial infarction, arrhythmia, heart block or bradycardia requiring permanent pacemaker placement, congestive heart failure, respiratory failure, renal failure, pneumonia, infection, other late complications related to structural valve deterioration or migration, or other complications that might ultimately cause a temporary or permanent loss of functional independence or other long term morbidity.  The patient provides full informed consent for the procedure as described and all questions were answered preoperatively.  DETAILS OF THE OPERATIVE PROCEDURE  PREPARATION:   The patient is brought to the operating room on the above mentioned date and central monitoring was established by the anesthesia team including placement of a radial arterial line. The patient is placed in the supine position on the operating table.  Intravenous antibiotics are administered. Conscious sedation is used.   Baseline transthoracic echocardiogram was performed. The patient's chest, abdomen, both groins, and both lower extremities are prepared and draped in a sterile manner. A time out procedure is performed.  PERIPHERAL ACCESS:   Using the modified Seldinger technique, femoral arterial and venous access were obtained with placement of a 6 Fr sheath in the artery and a 7 Fr sheath in the vein on the left side using u/s guidance.  A pigtail diagnostic catheter was passed through the femoral arterial sheath under fluoroscopic guidance into the aortic root.  A temporary transvenous pacemaker catheter was passed through the femoral venous sheath under fluoroscopic guidance into the right ventricle.  The pacemaker was tested to ensure stable lead placement and pacemaker capture. Aortic root angiography was performed in order to determine the optimal angiographic angle for valve deployment.  TRANSFEMORAL ACCESS:  A micropuncture kit was used to gain  access to the right femoral artery using u/s guidance. Position confirmed with angiography. Pre-closure with double ProGlide closure devices. The patient was heparinized systemically and ACT verified > 250 seconds.    A 14 Fr transfemoral E-sheath was introduced into the right femoral artery after progressively dilating over an Amplatz superstiff wire. An AL-1 catheter was used to direct a straight-tip exchange length wire across the native aortic valve into the left ventricle. This was exchanged out for a pigtail catheter and position was confirmed in the LV apex. Simultaneous LV and Ao pressures were recorded.  The pigtail catheter was then exchanged for an Amplatz Extra-stiff wire in the LV apex.   TRANSCATHETER HEART VALVE DEPLOYMENT:  An Edwards Sapien 3 THV (size 23 mm) was prepared and crimped per manufacturer's guidelines, and the proper orientation of the valve is confirmed on the [employer] delivery system. The valve was advanced through the introducer sheath using normal technique until in an appropriate position in the abdominal aorta beyond the sheath tip. The balloon was then retracted and using the fine-tuning wheel was centered on the valve. The valve was then advanced across the aortic arch using appropriate flexion of the catheter. The valve was carefully positioned across the aortic valve annulus. The Commander catheter was retracted using normal technique. Once final position of the valve has been confirmed by angiographic assessment, the valve is deployed while temporarily holding ventilation and during rapid ventricular pacing to maintain systolic blood pressure < 50 mmHg and pulse pressure < 10 mmHg. The balloon inflation is held for >3 seconds after reaching full deployment volume. Once the balloon has fully deflated the balloon is retracted into the ascending aorta and valve function is assessed using TTE. There is felt to  be no paravalvular leak and no central aortic  insufficiency.  The patient's hemodynamic recovery following valve deployment is good.  The deployment balloon and guidewire are both removed. Echo demostrated acceptable post-procedural gradients, stable mitral valve function, and no AI.   PROCEDURE COMPLETION:  The sheath was then removed and closure devices were completed. Protamine was administered once femoral arterial repair was complete. The temporary pacemaker, pigtail catheters and femoral sheaths were removed with a Mynx closure device placed in the artery and manual pressure used for venous hemostasis.    The patient tolerated the procedure well and is transported to the surgical intensive care in stable condition. There were no immediate intraoperative complications. All sponge instrument and needle counts are verified correct at completion of the operation.   No blood products were administered during the operation.  The patient received a total of 40 mL of intravenous contrast during the procedure.  Lauree Chandler MD 08/12/2020 11:31 AM

## 2020-08-12 NOTE — Progress Notes (Signed)
Mobility Specialist: Progress Note   08/12/20 1727  Mobility  Activity Ambulated in hall  Level of Assistance Minimal assist, patient does 75% or more  Assistive Device Front wheel walker  Distance Ambulated (ft) 56 ft  Mobility Ambulated with assistance in hallway  Mobility Response Tolerated fair  Mobility performed by Mobility specialist  $Mobility charge 1 Mobility   Pre-Mobility: 92 HR, 155/62 BP Post-Mobility: 101 HR, 139/97 BP  Pt required minA to sit EOB from supine as well as to stand. Pt c/o pain in her R groin after sitting EOB, no rating given. Pt notably shivering d/t being cold, RN notified. Pt did experience emesis after sitting EOB and requested medication for N/V, RN notified. Pt back to bed after walk, RN present in the room.   [facility] Raul Torrance Mobility Specialist Mobility Specialist Phone: [phone]

## 2020-08-12 NOTE — Progress Notes (Signed)
Patient arrived to 4E09 from the cath lab. Bilateral groin sites level zero with dressings c/d/I. Patient placed on telemetry and CCMD notified. VSS. Patient educated about bedrest and oriented to room. Bed low and locked with alarm engaged, and call bell left within reach.   Gailen Shelter RN

## 2020-08-12 NOTE — Anesthesia Preprocedure Evaluation (Signed)
Anesthesia Evaluation  Patient identified by MRN, date of birth, ID band Patient awake    Reviewed: Allergy & Precautions, NPO status , Patient's Chart, lab work & pertinent test results  Airway Mallampati: III  TM Distance: >3 FB Neck ROM: Full    Dental  (+) Upper Dentures, Lower Dentures   Pulmonary shortness of breath and with exertion, asthma , COPD,  COPD inhaler, former smoker,    Pulmonary exam normal breath sounds clear to auscultation       Cardiovascular + CAD, + Past MI, + CABG, + Peripheral Vascular Disease and +CHF  + Valvular Problems/Murmurs AS  Rhythm:Regular Rate:Normal + Systolic murmurs ECHO: 1. Basal inferior akinesis. Left ventricular ejection fraction, by estimation, is 55 to 60%. The left ventricle has normal function. Left ventricular diastolic parameters are indeterminate.  2. Right ventricular systolic function is low normal. The right ventricular size is mildly enlarged.  3. Trivial mitral valve regurgitation.  4. AV is thickened, calcified with restricted motion. Peak and mean gradients through the valve are 68 and 43 mm Hg respectively Dimensionless index is 0.23 consistent with severe AS. Compared to echo from 9.19.21,  gradients are increased. The aortic valve is tricuspid. Aortic valve regurgitation is not visualized.  5. The inferior vena cava is normal in size with greater than 50% respiratory variability, suggesting right atrial pressure of 3 mmHg.   Neuro/Psych Anxiety CVA    GI/Hepatic Neg liver ROS, hiatal hernia, GERD  Medicated and Controlled,  Endo/Other  negative endocrine ROS  Renal/GU negative Renal ROS     Musculoskeletal negative musculoskeletal ROS (+)   Abdominal   Peds  Hematology HLD   Anesthesia Other Findings Severe Aortic Stenosis  Reproductive/Obstetrics                             Anesthesia Physical Anesthesia Plan  ASA:  4  Anesthesia Plan: MAC   Post-op Pain Management:    Induction: Intravenous  PONV Risk Score and Plan: 2 and Ondansetron, Dexamethasone, Treatment may vary due to age or medical condition and Midazolam  Airway Management Planned: Simple Face Mask  Additional Equipment: Arterial line  Intra-op Plan:   Post-operative Plan:   Informed Consent: I have reviewed the patients History and Physical, chart, labs and discussed the procedure including the risks, benefits and alternatives for the proposed anesthesia with the patient or authorized representative who has indicated his/her understanding and acceptance.       Plan Discussed with: CRNA  Anesthesia Plan Comments:         Anesthesia Quick Evaluation

## 2020-08-12 NOTE — Anesthesia Procedure Notes (Signed)
Arterial Line Insertion Start/End6/28/2022 8:50 AM, 08/12/2020 9:10 AM Performed by: Murvin Natal, MD, anesthesiologist  Patient location: Pre-op. Preanesthetic checklist: patient identified, IV checked, site marked, risks and benefits discussed, surgical consent, monitors and equipment checked, pre-op evaluation, timeout performed and anesthesia consent Lidocaine 1% used for infiltration and patient sedated Left, radial was placed Catheter size: 20 Fr Hand hygiene performed , maximum sterile barriers used  and Seldinger technique used  Attempts: 2 Procedure performed using ultrasound guided technique. Ultrasound Notes:anatomy identified, needle tip was noted to be adjacent to the nerve/plexus identified and no ultrasound evidence of intravascular and/or intraneural injection Following insertion, dressing applied and Biopatch. Post procedure assessment: normal and unchanged  Patient tolerated the procedure well with no immediate complications.

## 2020-08-12 NOTE — Progress Notes (Signed)
  Echocardiogram 2D Echocardiogram has been performed.  Emily Rocha 08/12/2020, 11:08 AM

## 2020-08-12 NOTE — Discharge Instructions (Signed)
ACTIVITY AND EXERCISE °• Daily activity and exercise are an important part of your recovery. People recover at different rates depending on their general health and type of valve procedure. °• Most people recovering from TAVR feel better relatively quickly  °• No lifting, pushing, pulling more than 10 pounds (examples to avoid: groceries, vacuuming, gardening, golfing): °            - For one week with a procedure through the groin. °            - For six weeks for procedures through the chest wall or neck. °NOTE: You will typically see one of our providers 7-14 days after your procedure to discuss WHEN TO RESUME the above activities.  °  °  °DRIVING °• Do not drive until you are seen for follow up and cleared by a provider. Generally, we ask patient to not drive for 1 week after their procedure. °• If you have been told by your doctor in the past that you may not drive, you must talk with him/her before you begin driving again. °  °DRESSING °• Groin site: you may leave the clear dressing over the site for up to one week or until it falls off. °  °HYGIENE °• If you had a femoral (leg) procedure, you may take a shower when you return home. After the shower, pat the site dry. Do NOT use powder, oils or lotions in your groin area until the site has completely healed. °• If you had a chest procedure, you may shower when you return home unless specifically instructed not to by your discharging practitioner. °            - DO NOT scrub incision; pat dry with a towel. °            - DO NOT apply any lotions, oils, powders to the incision. °            - No tub baths / swimming for at least 2 weeks. °• If you notice any fevers, chills, increased pain, swelling, bleeding or pus, please contact your doctor. °  °ADDITIONAL INFORMATION °• If you are going to have an upcoming dental procedure, please contact our office as you will require antibiotics ahead of time to prevent infection on your heart valve.  ° ° °If you have any  questions or concerns you can call the structural heart phone during normal business hours 8am-4pm. If you have an urgent need after hours or weekends please call [phone] to talk to the on call provider for general cardiology. If you have an emergency that requires immediate attention, please call 911.  ° ° °After TAVR Checklist ° °Check  Test Description  ° Follow up appointment in 1-2 weeks  You will see our structural heart physician assistant, Emily Rocha. Your incision sites will be checked and you will be cleared to drive and resume all normal activities if you are doing well.    ° 1 month echo and follow up  You will have an echo to check on your new heart valve and be seen back in the office by Emily Rocha. Many times the echo is not read by your appointment time, but Emily will call you later that day or the following day to report your results.  ° Follow up with your primary cardiologist You will need to be seen by your primary cardiologist in the following 3-6 months after your 1 month appointment in the valve   clinic. Often times your Plavix or Aspirin will be discontinued during this time, but this is decided on a case by case basis.   ° 1 year echo and follow up You will have another echo to check on your heart valve after 1 year and be seen back in the office by Emily Rocha. This your last structural heart visit.  ° Bacterial endocarditis prophylaxis  You will have to take antibiotics for the rest of your life before all dental procedures (even teeth cleanings) to protect your heart valve. Antibiotics are also required before some surgeries. Please check with your cardiologist before scheduling any surgeries. Also, please make sure to tell us if you have a penicillin allergy as you will require an alternative antibiotic.   ° ° °

## 2020-08-12 NOTE — Interval H&P Note (Signed)
History and Physical Interval Note:  08/12/2020 9:32 AM  Emily Rocha  has presented today for surgery, with the diagnosis of Severe Aortic Stenosis.  The various methods of treatment have been discussed with the patient and family. After consideration of risks, benefits and other options for treatment, the patient has consented to  Procedure(s): TRANSCATHETER AORTIC VALVE REPLACEMENT, TRANSFEMORAL (N/A) TRANSESOPHAGEAL ECHOCARDIOGRAM (TEE) (N/A) as a surgical intervention.  The patient's history has been reviewed, patient examined, no change in status, stable for surgery.  I have reviewed the patient's chart and labs.  Questions were answered to the patient's satisfaction.     Gaye Pollack

## 2020-08-12 NOTE — Progress Notes (Signed)
Pharmacy Antibiotic Note  Emily Rocha is a 65 y.o. female s/p TAVR. Pharmacy consulted to dose vancomycin for surgical prophylaxis (MRSA PCR +) -ancef given ~ 9:40am -SCr= 0.5  Plan: -Vancomycin 1250mg  IV x1 -Will sign off. Please contact pharmacy with any other needs.  Thank you Hildred Laser, PharmD Clinical Pharmacist **Pharmacist phone directory can now be found on [email or web address] ([email or web address]).  Listed under [place].

## 2020-08-12 NOTE — Progress Notes (Signed)
  [facility] VALVE TEAM  Patient doing well s/p TAVR. She is hemodynamically stable. Groin sites stable. ECG with sinus and no high grade block. Arterial line discontinued and transferred to 4E. Plan for early ambulation after bedrest completed and hopeful discharge over the next 24-48 hours.   Angelena Form PA-C  [facility]  Pager [phone]

## 2020-08-12 NOTE — Progress Notes (Signed)
   08/12/20 1721  Vitals  Temp 97.7 F (36.5 C)  Temp Source Oral  BP (!) 139/97  MAP (mmHg) 111  BP Location Right Leg  BP Method Automatic  Patient Position (if appropriate) Lying  Pulse Rate 100  Pulse Rate Source Monitor  ECG Heart Rate (!) 101  Resp (!) 29  Level of Consciousness  Level of Consciousness Alert   RN notified by mobility tech that patient had become nauseous while progressing to EOB, with two bouts of clear emesis. Patient given antiemetic and observed visibly shivering. Patient walked with mobility tech and returned to bed. Patient endorsed pain in her right groin with movement that subsides with rest. Groin sites unchanged from assessment prior to movement (L groin-level 0, R groin level 1). VSS. Bilateral legs mottled in color. Bilateral DP successfully dopplered. CBG taken at 103. Warm blankets provided to patient. Agricultural consultant and RRT notified. TAVR PA also made aware. Will continue plan of care to monitor patient's vital signs, pain level and groin sites.   Gailen Shelter RN

## 2020-08-12 NOTE — Op Note (Signed)
[facility]   MULTIDISCIPLINARY HEART VALVE TEAM   TAVR OPERATIVE NOTE   Date of Procedure:  08/12/2020  Preoperative Diagnosis: Severe Aortic Stenosis   Postoperative Diagnosis: Same   Procedure:   Transcatheter Aortic Valve Replacement - Percutaneous Right Transfemoral Approach  Edwards Sapien 3 Ultra THV (size 23 mm, model # [id number], serial # [id number])   Co-Surgeons:  Gaye Pollack, MD and Lauree Chandler, MD   Anesthesiologist:  Perfecto Kingdom, MD  Echocardiographer:  Jerilynn Mages. Croitoru, MD  Pre-operative Echo Findings: Severe aortic stenosis Normal left ventricular systolic function  Post-operative Echo Findings: No paravalvular leak Normal left ventricular systolic function   BRIEF CLINICAL NOTE AND INDICATIONS FOR SURGERY  This 65 year old woman has stage D, severe, symptomatic aortic stenosis with near Heart Association class II-III symptoms of exertional fatigue and shortness of breath consistent with chronic diastolic congestive heart failure.  She recently underwent PCI of a high-grade right coronary stenosis with a significant improvement in her symptoms.  I have personally reviewed her 2D echocardiogram, cardiac catheterization, and CTA studies.  Her 2D echocardiogram shows a trileaflet aortic valve with thickening and calcification causing restricted leaflet mobility.  The mean gradient is 43 mmHg with a dimensionless index of 0.23 consistent with severe aortic stenosis.  Left ventricular systolic function is normal.  Cardiac catheterization shows severe native left main and three-vessel coronary disease with patent vein grafts and a atretic mammary to the LAD that is filling by collaterals from the diagonal vein graft.  She underwent successful PCI and stenting of a large dominant RCA.  The mean gradient across aortic valve was measured at 40.3 mmHg the peak to peak gradient of 55 mmHg.  I agree that aortic valve replacement is indicated in this patient  for relief of her symptoms and to prevent progressive left ventricular deterioration.  With her prior coronary bypass surgery and ascending aortic graft up to the right subclavian and right common carotid arteries and complex medical history I do not think she would be a candidate for third time redo sternotomy for aortic valve replacement.  I think transcatheter aortic valve replacement be the best option for her.  Her gated cardiac CTA shows anatomy suitable for TAVR using a SAPIEN 3 valve.  Her abdominal and pelvic CTA shows adequate pelvic vascular anatomy to allow transfemoral insertion.   The patient was counseled at length regarding treatment alternatives for management of severe symptomatic aortic stenosis. The risks and benefits of surgical intervention has been discussed in detail. Long-term prognosis with medical therapy was discussed. Alternative approaches such as conventional surgical aortic valve replacement, transcatheter aortic valve replacement, and palliative medical therapy were compared and contrasted at length. This discussion was placed in the context of the patient's own specific clinical presentation and past medical history. All of her questions have been addressed.   Following the decision to proceed with transcatheter aortic valve replacement, a discussion was held regarding what types of management strategies would be attempted intraoperatively in the event of life-threatening complications, including whether or not the patient would be considered a candidate for the use of cardiopulmonary bypass and/or conversion to open sternotomy for attempted surgical intervention.  I do not think she is a candidate for emergent sternotomy to manage any intraoperative complications since she has had 2 previous sternotomies and it would be difficult and time-consuming to reenter her chest again.  I discussed this with her.  The patient is aware of the fact that transient use of cardiopulmonary  bypass may be necessary. The patient has been advised of a variety of complications that might develop including but not limited to risks of death, stroke, paravalvular leak, aortic dissection or other major vascular complications, aortic annulus rupture, device embolization, cardiac rupture or perforation, mitral regurgitation, acute myocardial infarction, arrhythmia, heart block or bradycardia requiring permanent pacemaker placement, congestive heart failure, respiratory failure, renal failure, pneumonia, infection, other late complications related to structural valve deterioration or migration, or other complications that might ultimately cause a temporary or permanent loss of functional independence or other long term morbidity. The patient provides full informed consent for the procedure as described and all questions were answered.    DETAILS OF THE OPERATIVE PROCEDURE  PREPARATION:    The patient was brought to the operating room on the above mentioned date and appropriate monitoring was established by the anesthesia team. The patient was placed in the supine position on the operating table.  Intravenous antibiotics were administered. The patient was monitored closely throughout the procedure under conscious sedation.   Baseline transthoracic echocardiogram was performed. The patient's abdomen and both groins were prepped and draped in a sterile manner. A time out procedure was performed.   PERIPHERAL ACCESS:    Using the modified Seldinger technique, femoral arterial and venous access was obtained with placement of 6 Fr sheaths on the left side.  A pigtail diagnostic catheter was passed through the left arterial sheath under fluoroscopic guidance into the aortic root.  A temporary transvenous pacemaker catheter was passed through the left femoral venous sheath under fluoroscopic guidance into the right ventricle.  The pacemaker was tested to ensure stable lead placement and pacemaker capture.  Aortic root angiography was performed in order to determine the optimal angiographic angle for valve deployment.   TRANSFEMORAL ACCESS:   Percutaneous transfemoral access and sheath placement was performed using ultrasound guidance.  The right common femoral artery was cannulated using a micropuncture needle and appropriate location was verified using hand injection angiogram.  A pair of Abbott Perclose percutaneous closure devices were placed and a 6 French sheath replaced into the femoral artery.  The patient was heparinized systemically and ACT verified > 250 seconds.    A 14 Fr transfemoral E-sheath was introduced into the right common femoral artery after progressively dilating over an Amplatz superstiff wire. An AL-1 catheter was used to direct a straight-tip exchange length wire across the native aortic valve into the left ventricle. This was exchanged out for a pigtail catheter and position was confirmed in the LV apex. Simultaneous LV and Ao pressures were recorded.  The pigtail catheter was exchanged for an Amplatz Extra-stiff wire in the LV apex.     BALLOON AORTIC VALVULOPLASTY:   Not performed  TRANSCATHETER HEART VALVE DEPLOYMENT:   An Edwards Sapien 3 Ultra transcatheter heart valve (size 23 mm) was prepared and crimped per manufacturer's guidelines, and the proper orientation of the valve is confirmed on the [employer] delivery system. The valve was advanced through the introducer sheath using normal technique until in an appropriate position in the abdominal aorta beyond the sheath tip. The balloon was then retracted and using the fine-tuning wheel was centered on the valve. The valve was then advanced across the aortic arch using appropriate flexion of the catheter. The valve was carefully positioned across the aortic valve annulus. The Commander catheter was retracted using normal technique. Once final position of the valve has been confirmed by angiographic assessment, the  valve is deployed while temporarily holding ventilation  and during rapid ventricular pacing to maintain systolic blood pressure < 50 mmHg and pulse pressure < 10 mmHg. The balloon inflation is held for >3 seconds after reaching full deployment volume. Once the balloon has fully deflated the balloon is retracted into the ascending aorta and valve function is assessed using echocardiography. There is felt to be no paravalvular leak and no central aortic insufficiency.  The patient's hemodynamic recovery following valve deployment is good.  The deployment balloon and guidewire are both removed.    PROCEDURE COMPLETION:   The sheath was removed and femoral artery closure performed.  Protamine was administered once femoral arterial repair was complete. The temporary pacemaker, pigtail catheters and femoral sheaths were removed with manual pressure used for hemostasis.  A Mynx femoral closure device was utilized following removal of the diagnostic sheath in the left femoral artery.  The patient tolerated the procedure well and is transported to the cath lab recovery area in stable condition. There were no immediate intraoperative complications. All sponge instrument and needle counts are verified correct at completion of the operation.   No blood products were administered during the operation.  The patient received a total of 40 mL of intravenous contrast during the procedure.   Gaye Pollack, MD 08/12/2020

## 2020-08-13 ENCOUNTER — Inpatient Hospital Stay (HOSPITAL_COMMUNITY): Payer: Medicare HMO

## 2020-08-13 DIAGNOSIS — Z952 Presence of prosthetic heart valve: Secondary | ICD-10-CM

## 2020-08-13 DIAGNOSIS — I350 Nonrheumatic aortic (valve) stenosis: Principal | ICD-10-CM

## 2020-08-13 LAB — ECHOCARDIOGRAM COMPLETE
AR max vel: 2.35 cm2
AV Area VTI: 2.38 cm2
AV Area mean vel: 2.65 cm2
AV Mean grad: 8.1 mmHg
AV Peak grad: 16.8 mmHg
Ao pk vel: 2.05 m/s
Area-P 1/2: 4.77 cm2
Height: 64 in
S' Lateral: 3.2 cm
Weight: 2853.63 oz

## 2020-08-13 LAB — CBC
HCT: 34.8 % — ABNORMAL LOW (ref 36.0–46.0)
Hemoglobin: 10.1 g/dL — ABNORMAL LOW (ref 12.0–15.0)
MCH: 23.8 pg — ABNORMAL LOW (ref 26.0–34.0)
MCHC: 29 g/dL — ABNORMAL LOW (ref 30.0–36.0)
MCV: 81.9 fL (ref 80.0–100.0)
Platelets: 227 10*3/uL (ref 150–400)
RBC: 4.25 MIL/uL (ref 3.87–5.11)
RDW: 17.1 % — ABNORMAL HIGH (ref 11.5–15.5)
WBC: 7.8 10*3/uL (ref 4.0–10.5)
nRBC: 0 % (ref 0.0–0.2)

## 2020-08-13 LAB — URINALYSIS, ROUTINE W REFLEX MICROSCOPIC
Bacteria, UA: NONE SEEN
Bilirubin Urine: NEGATIVE
Glucose, UA: NEGATIVE mg/dL
Hgb urine dipstick: NEGATIVE
Ketones, ur: NEGATIVE mg/dL
Leukocytes,Ua: NEGATIVE
Nitrite: NEGATIVE
Protein, ur: 100 mg/dL — AB
Specific Gravity, Urine: 1.017 (ref 1.005–1.030)
pH: 5 (ref 5.0–8.0)

## 2020-08-13 LAB — BASIC METABOLIC PANEL
Anion gap: 7 (ref 5–15)
BUN: 8 mg/dL (ref 8–23)
CO2: 27 mmol/L (ref 22–32)
Calcium: 9 mg/dL (ref 8.9–10.3)
Chloride: 104 mmol/L (ref 98–111)
Creatinine, Ser: 0.62 mg/dL (ref 0.44–1.00)
GFR, Estimated: >60 mL/min (ref >60)
Glucose, Bld: 139 mg/dL — ABNORMAL HIGH (ref 70–99)
Potassium: 4.3 mmol/L (ref 3.5–5.1)
Sodium: 138 mmol/L (ref 135–145)

## 2020-08-13 LAB — MAGNESIUM: Magnesium: 1.9 mg/dL (ref 1.7–2.4)

## 2020-08-13 LAB — POCT ACTIVATED CLOTTING TIME
Activated Clotting Time: 121 seconds
Activated Clotting Time: 306 seconds

## 2020-08-13 MED ORDER — METOPROLOL SUCCINATE ER 50 MG PO TB24
50.0000 mg | ORAL_TABLET | Freq: Every day | ORAL | Status: DC
  Administered 2020-08-13 – 2020-08-14 (×2): 50 mg via ORAL
  Filled 2020-08-13 (×2): qty 1

## 2020-08-13 MED FILL — Fentanyl Citrate Preservative Free (PF) Inj 100 MCG/2ML: INTRAMUSCULAR | Qty: 2 | Status: AC

## 2020-08-13 NOTE — Progress Notes (Addendum)
[place] VALVE TEAM  Patient Name: Emily Rocha Date of Encounter: 08/13/2020  Primary Cardiologist: Dr. Remi Deter Problem List     Principal Problem:   S/P TAVR (transcatheter aortic valve replacement) Active Problems:   Carotid stenosis   GERD (gastroesophageal reflux disease)   S/P CABG x 3   S/P lobectomy of lung   History of lung cancer- Non-Small cell/S/p Prior VATs   CAD (coronary artery disease)/Prior CABG   Chronic respiratory failure with hypoxia (HCC)   Chronic diastolic CHF (congestive heart failure) (HCC)   Severe aortic stenosis   COPD (chronic obstructive pulmonary disease) (HCC)     Subjective   Feeling chilled and feverish. No other complaints.   Inpatient Medications    Scheduled Meds:  ALPRAZolam  0.5 mg Oral TID   arformoterol  15 mcg Nebulization BID   And   umeclidinium bromide  1 puff Inhalation Daily   aspirin EC  81 mg Oral Daily   atorvastatin  40 mg Oral Daily   clopidogrel  75 mg Oral Daily   isosorbide mononitrate  15 mg Oral Daily   mouth rinse  15 mL Mouth Rinse BID   pantoprazole  40 mg Oral q AM   sodium chloride flush  3 mL Intravenous Q12H   Continuous Infusions:  sodium chloride     nitroGLYCERIN     PRN Meds: sodium chloride, acetaminophen **OR** acetaminophen, albuterol, morphine injection, ondansetron (ZOFRAN) IV, oxyCODONE, sodium chloride flush, traMADol   Vital Signs    Vitals:   08/13/20 0513 08/13/20 0635 08/13/20 0739 08/13/20 0821  BP:   (!) 133/59   Pulse:  100 (!) 101 99  Resp:  20 20 (!) 23  Temp:   99 F (37.2 C)   TempSrc:   Oral   SpO2:  91% 90% 98%  Weight: 80.9 kg     Height:        Intake/Output Summary (Last 24 hours) at 08/13/2020 1003 Last data filed at 08/13/2020 0516 Gross per 24 hour  Intake 2735 ml  Output 820 ml  Net 1915 ml   Filed Weights   08/12/20 0723 08/13/20 0513  Weight: 76.7 kg 80.9 kg    Physical Exam    GEN: Well  nourished, well developed, in no acute distress.  HEENT: Grossly normal.  Neck: Supple, no JVD, or masses. Cardiac: RRR, soft flow murmur. No rubs, or gallops. No clubbing, cyanosis, edema.   Respiratory:  Respirations regular and unlabored, clear to auscultation bilaterally. GI: Soft, nontender, nondistended, BS + x 4. MS: no deformity or atrophy. Skin: warm and dry, no rash.  Groin sites clear without hematoma or ecchymosis  Neuro:  Strength and sensation are intact. Psych: AAOx3.  Normal affect.  Labs    CBC Recent Labs    08/12/20 1138 08/13/20 0128  WBC  --  7.8  HGB 10.9* 10.1*  HCT 32.0* 34.8*  MCV  --  81.9  PLT  --  786   Basic Metabolic Panel Recent Labs    08/12/20 1138 08/13/20 0128  NA 140 138  K 4.1 4.3  CL 103 104  CO2  --  27  GLUCOSE 100* 139*  BUN 11 8  CREATININE 0.50 0.62  CALCIUM  --  9.0  MG  --  1.9   Liver Function Tests No results for input(s): AST, ALT, ALKPHOS, BILITOT, PROT, ALBUMIN in the last 72 hours. No results for input(s): LIPASE, AMYLASE  in the last 72 hours. Cardiac Enzymes No results for input(s): CKTOTAL, CKMB, CKMBINDEX, TROPONINI in the last 72 hours. BNP Invalid input(s): POCBNP D-Dimer No results for input(s): DDIMER in the last 72 hours. Hemoglobin A1C No results for input(s): HGBA1C in the last 72 hours. Fasting Lipid Panel No results for input(s): CHOL, HDL, LDLCALC, TRIG, CHOLHDL, LDLDIRECT in the last 72 hours. Thyroid Function Tests No results for input(s): TSH, T4TOTAL, T3FREE, THYROIDAB in the last 72 hours.  Invalid input(s): FREET3  Telemetry    Sinus  - Personally Reviewed  ECG    Sinus - Personally Reviewed  Radiology    ECHOCARDIOGRAM LIMITED  Result Date: 08/12/2020    ECHOCARDIOGRAM LIMITED REPORT   Patient Name:   Emily Rocha Date of Exam: 08/12/2020 Medical Rec #:  [id number]    Height:       64.0 in Accession #:    [id number]   Weight:       169.0 lb Date of Birth:  1956/02/10    BSA:           1.821 m Patient Age:    82 years     BP:           121/57 mmHg Patient Gender: F            HR:           75 bpm. Exam Location:  Inpatient Procedure: Limited Echo, Cardiac Doppler and Color Doppler Indications:     I35.0 Nonrheumatic aortic (valve) stenosis  History:         Patient has prior history of Echocardiogram examinations, most                  recent 04/28/2020. CHF, CAD and Previous Myocardial Infarction,                  Prior CABG, COPD and TIA, Aortic Valve Disease;                  Signs/Symptoms:Syncope and Chest Pain. Cancer. Severe aortic                  stenosis.  Sonographer:     Roseanna Rainbow RDCS Referring Phys:  [place] Diagnosing Phys: Sanda Klein MD  Sonographer Comments: TAVR procedure. PRE-PROCEDURAL FINDINGS: Normal left ventricular systolic function. Estimated LVEF 60%. There are no regional wall motion abnormalities. Severe calcific aortic stenosis. Trileaflet aortic valve. Peak aortic valve gradient 66 mm Hg, mean gradient 39 mm Hg. Dimensionless obstructive index 0.23, calculated aortic valve area is 0.7 cm. No aortic insufficiency. Mild-moderate central mitral insufficiency. No pericardial effusion. POST-PROCEDURAL FINDINGS: Hyperdynamic left ventricular systolic function. Estimated LVEF 65-70%. There are no regional wall motion abnormalities. Well-seated TAVR stent-valve. After initial deployment there was mild-moderate perivalvular aortic oinsufficiency in the area of the left coronary cusp. After balloon re-inflation, this resolved completely. Peak aortic valve gradient 4 mm Hg, mean gradient 2 mm Hg. Dimensionless obstructive index 1.0, calculated aortic valve area is 3.14 cm. Acceleration time is 77 ms. There is no perivalvular leak. There is trivial central aortic insufficiency. Mild mitral insufficiency. No pericardial effusion. IMPRESSIONS  1. The left ventricle has no regional wall motion abnormalities. There is mild concentric left ventricular  hypertrophy.  2. Right ventricular systolic function is normal. The right ventricular size is normal.  3. Mild to moderate mitral valve regurgitation. Moderate mitral annular calcification.  4. The aortic valve is tricuspid. There is severe calcifcation  of the aortic valve. There is severe thickening of the aortic valve. Aortic valve regurgitation is trivial.  FINDINGS  Left Ventricle: The left ventricle has no regional wall motion abnormalities. The left ventricular internal cavity size was normal in size. There is mild concentric left ventricular hypertrophy. Right Ventricle: The right ventricular size is normal. No increase in right ventricular wall thickness. Right ventricular systolic function is normal. Left Atrium: Left atrial size was normal in size. Right Atrium: Right atrial size was normal in size. Pericardium: There is no evidence of pericardial effusion. Mitral Valve: Moderate mitral annular calcification. Mild to moderate mitral valve regurgitation, with centrally-directed jet. Tricuspid Valve: The tricuspid valve is grossly normal. Tricuspid valve regurgitation is not demonstrated. Aortic Valve: The aortic valve is tricuspid. There is severe calcifcation of the aortic valve. There is severe thickening of the aortic valve. Aortic valve regurgitation is trivial. Aortic valve mean gradient measures 39.0 mmHg. Aortic valve peak gradient measures 65.8 mmHg. Aortic valve area, by VTI measures 0.72 cm. There is a 23 mm Sapien prosthetic, stented (TAVR) valve present in the aortic position. Pulmonic Valve: The pulmonic valve was normal in structure. Pulmonic valve regurgitation is trivial. Aorta: The aortic root is normal in size and structure. IAS/Shunts: No atrial level shunt detected by color flow Doppler. LEFT VENTRICLE PLAX 2D LVOT diam:     2.00 cm LV SV:         69 LV SV Index:   38 LVOT Area:     3.14 cm  LV Volumes (MOD) LV vol d, MOD A2C: 97.3 ml LV vol d, MOD A4C: 81.6 ml LV vol s, MOD A2C: 38.5  ml LV vol s, MOD A4C: 30.5 ml LV SV MOD A2C:     58.8 ml LV SV MOD A4C:     81.6 ml LV SV MOD BP:      55.3 ml AORTIC VALVE AV Area (Vmax):    0.76 cm AV Area (Vmean):   0.71 cm AV Area (VTI):     0.72 cm AV Vmax:           405.69 cm/s AV Vmean:          293.971 cm/s AV VTI:            0.970 m AV Peak Grad:      65.8 mmHg AV Mean Grad:      39.0 mmHg LVOT Vmax:         98.52 cm/s LVOT Vmean:        66.759 cm/s LVOT VTI:          0.221 m LVOT/AV VTI ratio: 0.23  SHUNTS Systemic VTI:  0.22 m Systemic Diam: 2.00 cm Sanda Klein MD Electronically signed by Sanda Klein MD Signature Date/Time: 08/12/2020/11:20:05 AM    Final    Structural Heart Procedure  Result Date: 08/12/2020 See surgical note for result.   Cardiac Studies   [facility]  TAVR OPERATIVE NOTE     Date of Procedure:                08/12/2020   Preoperative Diagnosis:      Severe Aortic Stenosis   Postoperative Diagnosis:    Same   Procedure:        Transcatheter Aortic Valve Replacement - Transfemoral Approach             Edwards Sapien 3 THV (size 23 mm, model # [id number], serial # [id number])   Co-Surgeons:  Lauree Chandler, MD and Gaye Pollack, MD   Anesthesiologist:                  Roanna Banning   Echocardiographer:              Croitoru   Pre-operative Echo Findings: Severe aortic stenosis Normal left ventricular systolic function   Post-operative Echo Findings: No paravalvular leak Normal left ventricular systolic function   _______________________   Echo 08/12/20: completed but pending formal read   Patient Profile     Emily Rocha is a 65 y.o. female with a history of CAD s/p CABG (2018) and recent PCI, lung adenocarcinoma s/p right upper lobectomy in 2020, COPD, prior stroke, PVD, carotid artery disease s/p R CEA (2016) and right subclavian/right carotid bypass (2019), GERD and severe aortic stenosis who presented to [facility] on 08/12/20 for planned  TAVR.  Assessment & Plan    Severe AS: s/p successful TAVR with a  mm Edwards Sapien 3 Ultra 23 THV via the TF approach on 08/12/20. Post operative echo pending. Groin sites are stable. ECG with sinus and no high grade heart block. Continue Asprin and Plavix. Plan to keep one more night given fever  CAD s/p CABG: pre TAVR cath showed severe left main stenosis. The diagonal and LAD filled from a patent vein graft to the diagonal branch.  The LIMA graft was atraumatic.  The chronically occluded left circumflex filled from a patent vein graft.  The RCA had a severe heavily calcific stenosis. She later underwent successful orbital atherectomy and DES placement of proximal RCA on 06/12/20. Continue aspirin and plavix. Continue imdur, BB and statin.   HTN: Bp well controlled. Continue on home meds  Fever: pt has a mild temp with associated chills and cold sweats.  White count normal. Will check UA and CXR and keep for one more day of observation   Signed, Angelena Form, PA-C  08/13/2020, 10:03 AM  Pager [phone]  I have personally seen and examined this patient. I agree with the assessment and plan as outlined above.  Doing well post TAVR. Low grade fever. U/A and Chest x-ray today.  Echo this am. Monitor for 24 more hours.   Lauree Chandler 08/13/2020 10:37 AM

## 2020-08-13 NOTE — Progress Notes (Signed)
CARDIAC REHAB PHASE I   PRE:  Rate/Rhythm: 96 SR    BP: sitting 132/59    SaO2: 83 RA, 92 RA with PLB  MODE:  Ambulation: 170 ft   POST:  Rate/Rhythm: 113 ST    BP: to xray     SaO2: 77-80 RA, 92 2L  Pt just to recliner with staff, SaO2 initially low. Able to bring it up with pursed lip breathing. C/o significant right groin pain when sitting. Stood and ambulated with RW, RA. SOB with distance, fatigue. SaO2 would not register after 80 ft, 77-80 RA after 170 ft. Pt could not increase SaO2 with PLB. Applied 2L for pt to go to xray. Sts her normal SaO2 at home is 92 RA. Encouraged another walk later today, probably on O2. Pt is congested, which she sts is her norm.  [id number]   [place], ACSM 08/13/2020 10:45 AM

## 2020-08-13 NOTE — Progress Notes (Signed)
  Echocardiogram 2D Echocardiogram has been performed.  Emily Rocha 08/13/2020, 9:44 AM

## 2020-08-14 LAB — CBC
HCT: 32.5 % — ABNORMAL LOW (ref 36.0–46.0)
Hemoglobin: 9.3 g/dL — ABNORMAL LOW (ref 12.0–15.0)
MCH: 23.4 pg — ABNORMAL LOW (ref 26.0–34.0)
MCHC: 28.6 g/dL — ABNORMAL LOW (ref 30.0–36.0)
MCV: 81.7 fL (ref 80.0–100.0)
Platelets: 198 10*3/uL (ref 150–400)
RBC: 3.98 MIL/uL (ref 3.87–5.11)
RDW: 16.6 % — ABNORMAL HIGH (ref 11.5–15.5)
WBC: 6 10*3/uL (ref 4.0–10.5)
nRBC: 0 % (ref 0.0–0.2)

## 2020-08-14 LAB — BASIC METABOLIC PANEL
Anion gap: 5 (ref 5–15)
BUN: 5 mg/dL — ABNORMAL LOW (ref 8–23)
CO2: 30 mmol/L (ref 22–32)
Calcium: 9.1 mg/dL (ref 8.9–10.3)
Chloride: 97 mmol/L — ABNORMAL LOW (ref 98–111)
Creatinine, Ser: 0.47 mg/dL (ref 0.44–1.00)
GFR, Estimated: >60 mL/min (ref >60)
Glucose, Bld: 127 mg/dL — ABNORMAL HIGH (ref 70–99)
Potassium: 4.5 mmol/L (ref 3.5–5.1)
Sodium: 132 mmol/L — ABNORMAL LOW (ref 135–145)

## 2020-08-14 NOTE — Progress Notes (Signed)
Discharge instructions (including medications) discussed with and copy provided to patient/caregiver 

## 2020-08-14 NOTE — Progress Notes (Signed)
SATURATION QUALIFICATIONS: (This note is used to comply with regulatory documentation for home oxygen)  Patient Saturations on Room Air at Rest = 85% in bed  Patient Saturations on Room Air while Ambulating = 85% standing with pursed lip breathing   Patient Saturations on 2 Liters of oxygen while Ambulating = 93%  Please briefly explain why patient needs home oxygen: Pt desaturates quickly in bed and with activity without O2.  Yves Dill CES, ACSM 10:24 AM 08/14/2020

## 2020-08-14 NOTE — Progress Notes (Signed)
Patient given discharge instructions and stated understanding.  She is waiting for her O2 to be delivered and her husband is picking her up.

## 2020-08-14 NOTE — Plan of Care (Signed)
  Problem: Education: Goal: Knowledge of General Education information will improve Description: Including pain rating scale, medication(s)/side effects and non-pharmacologic comfort measures Outcome: Adequate for Discharge   

## 2020-08-14 NOTE — TOC Transition Note (Signed)
Transition of Care (TOC) - CM/SW Discharge Note Marvetta Gibbons RN, BSN Transitions of Care Unit 4E- RN Case Manager See Treatment Team for direct phone #    Patient Details  Name: Emily Rocha MRN: [id number] Date of Birth: [birth year]  [facility] CM/SW Contact:  Dawayne Patricia, RN Phone Number: 08/14/2020, 2:35 PM   Clinical Narrative:    Pt stable for transition home today, noted order placed for home 02, pt already active with [employer] for home 02 needs. Call made to [facility] for transport tank for pt to transport home with- 02 tank to be delivered to room prior to discharge.  No further needs noted.    Final next level of care: Home/Self Care Barriers to Discharge: No Barriers Identified   Patient Goals and CMS Choice    NA- pt already active with DME    Discharge Placement               Home        Discharge Plan and Services   Discharge Planning Services: CM Consult            DME Arranged: Oxygen DME Agency: [employer] Date DME Agency Contacted: 08/14/20 Time DME Agency Contacted: 0211 Representative spoke with at DME Agency: Thedore Mins HH Arranged: NA [place] Agency: NA        Social Determinants of Health ([place]) Interventions     Readmission Risk Interventions Readmission Risk Prevention Plan 08/14/2020  Post Dischage Appt Complete  Medication Screening Complete  Transportation Screening Complete  Some recent data might be hidden

## 2020-08-14 NOTE — Progress Notes (Signed)
CARDIAC REHAB PHASE I   PRE:  Rate/Rhythm: 83 SR    BP: sitting 136/64    SaO2: 98 2L, 85 RA in bed, 85 RA standing with pursed lip breathing  MODE:  Ambulation: 330 ft   POST:  Rate/Rhythm: 100 ST    BP: sitting 136/63     SaO2: 93 2L  Pt feeling well but desaturates quickly without O2. She sts that historically she needs O2 for a while after a surgery. Qualifying note placed. Pt ambulated on 2L and tolerated well. Brief rests x3 but no c/o, feels good. Discussed restrictions, walking at home and CRPII. She is eager to do CRPII and will refer to [facility] again. [phone]   [place], ACSM 08/14/2020 10:25 AM

## 2020-08-14 NOTE — Discharge Summary (Addendum)
[place] VALVE TEAM  Discharge Summary    Patient ID: Emily Rocha MRN: [id number]; DOB: 1955/11/02  Admit date: 08/12/2020 Discharge date: 08/14/2020  Primary Care Provider: Glenda Chroman, MD  Primary Cardiologist: Carlyle Dolly, MD / Dr. Angelena Form & Dr. Cyndia Bent (TAVR)  Discharge Diagnoses    Principal Problem:   S/P TAVR (transcatheter aortic valve replacement) Active Problems:   Carotid stenosis   GERD (gastroesophageal reflux disease)   S/P CABG x 3   S/P lobectomy of lung   History of lung cancer- Non-Small cell/S/p Prior VATs   CAD (coronary artery disease)/Prior CABG   Chronic respiratory failure with hypoxia (HCC)   Chronic diastolic CHF (congestive heart failure) (HCC)   Severe aortic stenosis   COPD (chronic obstructive pulmonary disease) (HCC)   Allergies No Known Allergies  Diagnostic Studies/Procedures    [facility]  TAVR OPERATIVE NOTE     Date of Procedure:                08/12/2020   Preoperative Diagnosis:      Severe Aortic Stenosis   Postoperative Diagnosis:    Same   Procedure:        Transcatheter Aortic Valve Replacement - Transfemoral Approach             Edwards Sapien 3 THV (size 23 mm, model # [id number], serial # [id number])   Co-Surgeons:                        Lauree Chandler, MD and Gaye Pollack, MD   Anesthesiologist:                  Roanna Banning   Echocardiographer:              Croitoru   Pre-operative Echo Findings: Severe aortic stenosis Normal left ventricular systolic function   Post-operative Echo Findings: No paravalvular leak Normal left ventricular systolic function     _______________________     Echo 08/12/20: IMPRESSIONS  1. Left ventricular ejection fraction, by estimation, is 65 to 70%. The  left ventricle has normal function. The left ventricle has no regional  wall motion abnormalities. Left ventricular diastolic parameters are  consistent  with Grade II diastolic  dysfunction (pseudonormalization). Elevated left atrial pressure.   2. Right ventricular systolic function is normal. The right ventricular  size is normal.   3. Left atrial size was moderately dilated.   4. The mitral valve is normal in structure. Mild mitral valve  regurgitation.   5. The aortic valve has been repaired/replaced. Aortic valve  regurgitation is not visualized. There is a 23 mm Sapien prosthetic (TAVR)  valve present in the aortic position. Procedure Date: 08/12/2020. Echo  findings are consistent with normal structure  and function of the aortic valve prosthesis. Aortic valve mean gradient  measures 8.1 mmHg. Aortic valve Vmax measures 2.05 m/s. Aortic valve  acceleration time measures 95 msec.   6. The inferior vena cava is normal in size with greater than 50%  respiratory variability, suggesting right atrial pressure of 3 mmHg.   History of Present Illness     Emily Rocha is a 65 y.o. female with a history of CAD s/p CABG (2018) and recent PCI, lung adenocarcinoma s/p right upper lobectomy in 2020, COPD, prior stroke, PVD, carotid artery disease s/p R CEA (2016) and redo sternotomy for right subclavian/right carotid bypass (2019) with superficial sternal  wound infection requiring wound debridement and wound VAC therapy, GERD and severe aortic stenosis who presented to [facility] on 08/12/20 for planned TAVR.  She has a history of coronary disease status post CABG x3 by Dr. Lucianne Lei trigt in 2018, redo sternotomy with aorto to right common carotid artery and right subclavian artery bypasses by Dr. Lucianne Lei trigt and Dr. Oneida Alar on 12/26/2017 for high-grade stenosis of the innominate artery, superficial sternal wound infection requiring wound debridement and wound VAC therapy on 01/06/2018 and 01/13/2018.  She was subsequently diagnosed with a right upper lobe pulmonary nodule and underwent VATS right upper lobectomy by Dr. Kipp Brood on 10/26/2018.  She also has a history  of prior smoking and COPD, prior stroke, and severe aortic stenosis.  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  She now presents with progressive exertional dyspnea and fatigue.  Her most recent echocardiogram on 04/28/2020 showed a trileaflet, thickened and calcified aortic valve with restricted leaflet mobility.  The mean gradient was 43 mmHg and the peak 68 mmHg.  Dimensionless index was 0.23 consistent with severe aortic stenosis.  Left ventricular systolic function was low normal with ejection fraction of 55 to 60%.  Her gradients had increased compared to her previous echo in September 2021. Cardiac catheterization on 06/04/2020 showed severe left main stenosis. The diagonal and LAD filled from a patent vein graft to the diagonal branch.  The LIMA graft was atraumatic.  The chronically occluded left circumflex filled from a patent vein graft.  The RCA had a severe heavily calcific stenosis.  The mean gradient across aortic valve was measured at 40.3 mmHg with a peak to peak gradient of 55 mmHg and aortic valve area of 0.75 cm.  She subsequently underwent staged PCI of the RCA with orbital atherectomy and DES on 06/12/2020 and said that she is felt much better since then with a decrease in her shortness of breath and fatigue.  The patient has been evaluated by the multidisciplinary valve team and felt to have severe, symptomatic aortic stenosis and to be a suitable candidate for TAVR, which was set up for 08/12/20.   Hospital Course     Consultants: none   Severe AS: s/p successful TAVR with a 23 mm Edwards Sapien 3 Ultra 23 THV via the TF approach on 08/12/20. Post operative echo showed EF 65%, normally functioning TAVR with a mean gradient of 8.1 mmHg and no PVL.Groin sites are stable. ECG with sinus and no high grade heart block. Continued on Asprin and Plavix. Plan for discharge home today with close follow up in the  office next week.    CAD s/p CABG: pre TAVR cath showed severe left main stenosis. The diagonal and LAD filled from a patent vein graft to the diagonal branch.  The LIMA graft was atraumatic.  The chronically occluded left circumflex filled from a patent vein graft.  The RCA had a severe heavily calcific stenosis. She later underwent successful orbital atherectomy and DES placement of proximal RCA on 06/12/20. Continue aspirin and plavix. Continue imdur, BB and statin.   HTN: Bp well controlled. Continue on home meds   Fever: pt has a mild temp with associated chills and cold sweats.  White count normal. UA and CXR were normal. Likely viral. Pt wants to go home.    _____________  Discharge Vitals Blood pressure 135/61, pulse 89, temperature 99.9 F (37.7 C), temperature source Oral, resp. rate  20, height 5\' 4"  (1.626 m), weight 81.6 kg, SpO2 96 %.  Filed Weights   08/12/20 0723 08/13/20 0513 08/14/20 0611  Weight: 76.7 kg 80.9 kg 81.6 kg    GEN: Well nourished, well developed, in no acute distress HEENT: normal Neck: no JVD or masses Cardiac: RRR; soft flow murmur. no rubs, or gallops,no edema  Respiratory:  clear to auscultation bilaterally, normal work of breathing GI: soft, nontender, nondistended, + BS MS: no deformity or atrophy Skin: warm and dry, no rash.  Groin sites clear without hematoma or ecchymosis  Neuro:  Alert and Oriented x 3, Strength and sensation are intact Psych: euthymic mood, full affect  Labs & Radiologic Studies    CBC Recent Labs    08/13/20 0128 08/14/20 0047  WBC 7.8 6.0  HGB 10.1* 9.3*  HCT 34.8* 32.5*  MCV 81.9 81.7  PLT 227 947   Basic Metabolic Panel Recent Labs    08/13/20 0128 08/14/20 0047  NA 138 132*  K 4.3 4.5  CL 104 97*  CO2 27 30  GLUCOSE 139* 127*  BUN 8 5*  CREATININE 0.62 0.47  CALCIUM 9.0 9.1  MG 1.9  --    Liver Function Tests No results for input(s): AST, ALT, ALKPHOS, BILITOT, PROT, ALBUMIN in the last 72  hours. No results for input(s): LIPASE, AMYLASE in the last 72 hours. Cardiac Enzymes No results for input(s): CKTOTAL, CKMB, CKMBINDEX, TROPONINI in the last 72 hours. BNP Invalid input(s): POCBNP D-Dimer No results for input(s): DDIMER in the last 72 hours. Hemoglobin A1C No results for input(s): HGBA1C in the last 72 hours. Fasting Lipid Panel No results for input(s): CHOL, HDL, LDLCALC, TRIG, CHOLHDL, LDLDIRECT in the last 72 hours. Thyroid Function Tests No results for input(s): TSH, T4TOTAL, T3FREE, THYROIDAB in the last 72 hours.  Invalid input(s): FREET3 _____________  DG Chest 2 View  Result Date: 08/13/2020 CLINICAL DATA:  Fevers, history of recent TAVR EXAM: CHEST - 2 VIEW COMPARISON:  08/08/2020 FINDINGS: Cardiac shadow is stable. Postsurgical changes are noted. Findings are recent TAVR are seen. Minimal blunting of the right costophrenic angle is noted. No focal confluent infiltrate is seen. No bony abnormality is noted. IMPRESSION: Small right-sided effusion versus atelectasis. No other focal abnormality is noted. Electronically Signed   By: Inez Catalina M.D.   On: 08/13/2020 12:18   DG Chest 2 View  Result Date: 08/10/2020 CLINICAL DATA:  Severe aortic stenosis. EXAM: CHEST - 2 VIEW COMPARISON:  Radiograph 01/04/2020, chest CT 06/24/2020 FINDINGS: Chain sutures project in the right suprahilar region. Patient is post median sternotomy and CABG. Aortic atherosclerosis. Normal heart size and mediastinal contours. There is a small retrocardiac hiatal hernia. Emphysema was better appreciated on prior CT. There is mild chronic bronchial thickening. No pulmonary edema. No pleural effusion, focal airspace disease, or pneumothorax. The small nodules in the left upper lobe on prior CT are not seen by radiograph. No pulmonary mass. No acute osseous abnormalities are seen. IMPRESSION: 1. No acute chest findings. 2. Post CABG. Postsurgical change in the right hemithorax. 3. Chronic bronchial  thickening with emphysema better demonstrated on prior CT. Electronically Signed   By: Keith Rake M.D.   On: 08/10/2020 14:38   ECHOCARDIOGRAM COMPLETE  Result Date: 08/13/2020    ECHOCARDIOGRAM REPORT   Patient Name:   Emily Rocha Date of Exam: 08/13/2020 Medical Rec #:  [id number]    Height:       64.0 in Accession #:  [id number]   Weight:       178.4 lb Date of Birth:  10-10-1955    BSA:          1.863 m Patient Age:    34 years     BP:           133/59 mmHg Patient Gender: F            HR:           101 bpm. Exam Location:  Inpatient Procedure: Limited Echo, Cardiac Doppler and Color Doppler Indications:    Post TAVR evaluation  History:        Patient has prior history of Echocardiogram examinations, most                 recent 08/12/2020. CAD and Previous Myocardial Infarction, Prior                 CABG, Stroke, Carotid Disease and PVD, Aortic Valve Disease;                 Risk Factors:Obesity.                 Aortic Valve: 23 mm Sapien prosthetic, stented (TAVR) valve is                 present in the aortic position. Procedure Date: 08/12/2020.  Sonographer:    Dustin Flock Referring Phys: [id number] [place]  1. Left ventricular ejection fraction, by estimation, is 65 to 70%. The left ventricle has normal function. The left ventricle has no regional wall motion abnormalities. Left ventricular diastolic parameters are consistent with Grade II diastolic dysfunction (pseudonormalization). Elevated left atrial pressure.  2. Right ventricular systolic function is normal. The right ventricular size is normal.  3. Left atrial size was moderately dilated.  4. The mitral valve is normal in structure. Mild mitral valve regurgitation.  5. The aortic valve has been repaired/replaced. Aortic valve regurgitation is not visualized. There is a 23 mm Sapien prosthetic (TAVR) valve present in the aortic position. Procedure Date: 08/12/2020. Echo findings are consistent with normal  structure and function of the aortic valve prosthesis. Aortic valve mean gradient measures 8.1 mmHg. Aortic valve Vmax measures 2.05 m/s. Aortic valve acceleration time measures 95 msec.  6. The inferior vena cava is normal in size with greater than 50% respiratory variability, suggesting right atrial pressure of 3 mmHg. FINDINGS  Left Ventricle: Left ventricular ejection fraction, by estimation, is 65 to 70%. The left ventricle has normal function. The left ventricle has no regional wall motion abnormalities. The left ventricular internal cavity size was normal in size. There is  borderline concentric left ventricular hypertrophy. Left ventricular diastolic parameters are consistent with Grade II diastolic dysfunction (pseudonormalization). Elevated left atrial pressure. Right Ventricle: The right ventricular size is normal. No increase in right ventricular wall thickness. Right ventricular systolic function is normal. Left Atrium: Left atrial size was moderately dilated. Right Atrium: Right atrial size was normal in size. Pericardium: There is no evidence of pericardial effusion. Mitral Valve: The mitral valve is normal in structure. Mild mitral valve regurgitation, with centrally-directed jet. Tricuspid Valve: The tricuspid valve is normal in structure. Tricuspid valve regurgitation is not demonstrated. Aortic Valve: The aortic valve has been repaired/replaced. Aortic valve regurgitation is not visualized. Aortic valve mean gradient measures 8.1 mmHg. Aortic valve peak gradient measures 16.8 mmHg. Aortic valve area, by VTI measures 2.38 cm. There is a 23 mm Sapien prosthetic,  stented (TAVR) valve present in the aortic position. Procedure Date: 08/12/2020. Echo findings are consistent with normal structure and function of the aortic valve prosthesis. Pulmonic Valve: The pulmonic valve was not well visualized. Pulmonic valve regurgitation is not visualized. Aorta: The aortic root is normal in size and structure.  Venous: The inferior vena cava is normal in size with greater than 50% respiratory variability, suggesting right atrial pressure of 3 mmHg. IAS/Shunts: No atrial level shunt detected by color flow Doppler.  LEFT VENTRICLE PLAX 2D LVIDd:         4.40 cm  Diastology LVIDs:         3.20 cm  LV e' medial:    6.09 cm/s LV PW:         1.20 cm  LV E/e' medial:  22.7 LV IVS:        1.00 cm  LV e' lateral:   7.62 cm/s LVOT diam:     2.10 cm  LV E/e' lateral: 18.1 LV SV:         86 LV SV Index:   46 LVOT Area:     3.46 cm  LEFT ATRIUM         Index LA diam:    4.60 cm 2.47 cm/m  AORTIC VALVE AV Area (Vmax):    2.35 cm AV Area (Vmean):   2.65 cm AV Area (VTI):     2.38 cm AV Vmax:           205.08 cm/s AV Vmean:          129.034 cm/s AV VTI:            0.362 m AV Peak Grad:      16.8 mmHg AV Mean Grad:      8.1 mmHg LVOT Vmax:         139.00 cm/s LVOT Vmean:        98.900 cm/s LVOT VTI:          0.249 m LVOT/AV VTI ratio: 0.69  AORTA Ao Root diam: 2.90 cm MITRAL VALVE MV Area (PHT): 4.77 cm     SHUNTS MV Decel Time: 159 msec     Systemic VTI:  0.25 m MV E velocity: 138.00 cm/s  Systemic Diam: 2.10 cm MV A velocity: 116.00 cm/s MV E/A ratio:  1.19 Mihai Croitoru MD Electronically signed by Sanda Klein MD Signature Date/Time: 08/13/2020/12:35:20 PM    Final    ECHOCARDIOGRAM LIMITED  Result Date: 08/12/2020    ECHOCARDIOGRAM LIMITED REPORT   Patient Name:   Emily Rocha Date of Exam: 08/12/2020 Medical Rec #:  [id number]    Height:       64.0 in Accession #:    [id number]   Weight:       169.0 lb Date of Birth:  Sep 04, 1955    BSA:          1.821 m Patient Age:    38 years     BP:           121/57 mmHg Patient Gender: F            HR:           75 bpm. Exam Location:  Inpatient Procedure: Limited Echo, Cardiac Doppler and Color Doppler Indications:     I35.0 Nonrheumatic aortic (valve) stenosis  History:         Patient has prior history of Echocardiogram examinations, most  recent 04/28/2020. CHF, CAD  and Previous Myocardial Infarction,                  Prior CABG, COPD and TIA, Aortic Valve Disease;                  Signs/Symptoms:Syncope and Chest Pain. Cancer. Severe aortic                  stenosis.  Sonographer:     Roseanna Rainbow RDCS Referring Phys:  [place] Diagnosing Phys: Sanda Klein MD  Sonographer Comments: TAVR procedure. PRE-PROCEDURAL FINDINGS: Normal left ventricular systolic function. Estimated LVEF 60%. There are no regional wall motion abnormalities. Severe calcific aortic stenosis. Trileaflet aortic valve. Peak aortic valve gradient 66 mm Hg, mean gradient 39 mm Hg. Dimensionless obstructive index 0.23, calculated aortic valve area is 0.7 cm. No aortic insufficiency. Mild-moderate central mitral insufficiency. No pericardial effusion. POST-PROCEDURAL FINDINGS: Hyperdynamic left ventricular systolic function. Estimated LVEF 65-70%. There are no regional wall motion abnormalities. Well-seated TAVR stent-valve. After initial deployment there was mild-moderate perivalvular aortic oinsufficiency in the area of the left coronary cusp. After balloon re-inflation, this resolved completely. Peak aortic valve gradient 4 mm Hg, mean gradient 2 mm Hg. Dimensionless obstructive index 1.0, calculated aortic valve area is 3.14 cm. Acceleration time is 77 ms. There is no perivalvular leak. There is trivial central aortic insufficiency. Mild mitral insufficiency. No pericardial effusion. IMPRESSIONS  1. The left ventricle has no regional wall motion abnormalities. There is mild concentric left ventricular hypertrophy.  2. Right ventricular systolic function is normal. The right ventricular size is normal.  3. Mild to moderate mitral valve regurgitation. Moderate mitral annular calcification.  4. The aortic valve is tricuspid. There is severe calcifcation of the aortic valve. There is severe thickening of the aortic valve. Aortic valve regurgitation is trivial.  FINDINGS  Left Ventricle: The  left ventricle has no regional wall motion abnormalities. The left ventricular internal cavity size was normal in size. There is mild concentric left ventricular hypertrophy. Right Ventricle: The right ventricular size is normal. No increase in right ventricular wall thickness. Right ventricular systolic function is normal. Left Atrium: Left atrial size was normal in size. Right Atrium: Right atrial size was normal in size. Pericardium: There is no evidence of pericardial effusion. Mitral Valve: Moderate mitral annular calcification. Mild to moderate mitral valve regurgitation, with centrally-directed jet. Tricuspid Valve: The tricuspid valve is grossly normal. Tricuspid valve regurgitation is not demonstrated. Aortic Valve: The aortic valve is tricuspid. There is severe calcifcation of the aortic valve. There is severe thickening of the aortic valve. Aortic valve regurgitation is trivial. Aortic valve mean gradient measures 39.0 mmHg. Aortic valve peak gradient measures 65.8 mmHg. Aortic valve area, by VTI measures 0.72 cm. There is a 23 mm Sapien prosthetic, stented (TAVR) valve present in the aortic position. Pulmonic Valve: The pulmonic valve was normal in structure. Pulmonic valve regurgitation is trivial. Aorta: The aortic root is normal in size and structure. IAS/Shunts: No atrial level shunt detected by color flow Doppler. LEFT VENTRICLE PLAX 2D LVOT diam:     2.00 cm LV SV:         69 LV SV Index:   38 LVOT Area:     3.14 cm  LV Volumes (MOD) LV vol d, MOD A2C: 97.3 ml LV vol d, MOD A4C: 81.6 ml LV vol s, MOD A2C: 38.5 ml LV vol s, MOD A4C: 30.5 ml LV SV MOD A2C:  58.8 ml LV SV MOD A4C:     81.6 ml LV SV MOD BP:      55.3 ml AORTIC VALVE AV Area (Vmax):    0.76 cm AV Area (Vmean):   0.71 cm AV Area (VTI):     0.72 cm AV Vmax:           405.69 cm/s AV Vmean:          293.971 cm/s AV VTI:            0.970 m AV Peak Grad:      65.8 mmHg AV Mean Grad:      39.0 mmHg LVOT Vmax:         98.52 cm/s LVOT  Vmean:        66.759 cm/s LVOT VTI:          0.221 m LVOT/AV VTI ratio: 0.23  SHUNTS Systemic VTI:  0.22 m Systemic Diam: 2.00 cm Sanda Klein MD Electronically signed by Sanda Klein MD Signature Date/Time: 08/12/2020/11:20:05 AM    Final    Structural Heart Procedure  Result Date: 08/12/2020 See surgical note for result.  Disposition   Pt is being discharged home today in good condition.  Follow-up Plans & Appointments     Follow-up Information     Eileen Stanford, PA-C. Go on 08/20/2020.   Specialties: Cardiology, Radiology Why: @ 1pm, please arrive at least 10 minutes early Contact information: [address] [place] [id number] [phone]                  Discharge Medications   Allergies as of 08/14/2020   No Known Allergies      Medication List     TAKE these medications    albuterol 108 (90 Base) MCG/ACT inhaler Commonly known as: VENTOLIN HFA Inhale 1-2 puffs into the lungs every 6 (six) hours as needed for wheezing or shortness of breath.   albuterol (2.5 MG/3ML) 0.083% nebulizer solution Commonly known as: PROVENTIL Take 2.5 mg by nebulization every 4 (four) hours as needed for wheezing or shortness of breath.   ALPRAZolam 0.5 MG tablet Commonly known as: XANAX Take 0.5 mg by mouth 3 (three) times daily.   aspirin EC 81 MG tablet Take 1 tablet (81 mg total) by mouth daily. Swallow whole.   atorvastatin 40 MG tablet Commonly known as: Lipitor Take 1 tablet (40 mg total) daily by mouth.   cholecalciferol 25 MCG (1000 UNIT) tablet Commonly known as: VITAMIN D3 Take 1,000 Units by mouth in the morning.   clopidogrel 75 MG tablet Commonly known as: Plavix Take 1 tablet (75 mg total) by mouth daily.   diphenhydramine-acetaminophen 25-500 MG Tabs tablet Commonly known as: TYLENOL PM Take 2 tablets by mouth at bedtime.   isosorbide mononitrate 30 MG 24 hr tablet Commonly known as: IMDUR Take 15 mg by mouth daily.    metoprolol succinate 50 MG 24 hr tablet Commonly known as: TOPROL-XL Take 50 mg by mouth daily. Take with or immediately following a meal.   multivitamin with minerals Tabs tablet Take 1 tablet by mouth daily.   pantoprazole 40 MG tablet Commonly known as: PROTONIX Take 40 mg by mouth in the morning.   potassium chloride SA 20 MEQ tablet Commonly known as: KLOR-CON Take 20 mEq by mouth in the morning.   Stiolto Respimat 2.5-2.5 MCG/ACT Aers Generic drug: Tiotropium Bromide-Olodaterol Inhale 2 puffs into the lungs daily.   torsemide 20 MG tablet Commonly known  as: DEMADEX Take 40 mg by mouth See admin instructions. Take 2 tablets (40 mg) by mouth scheduled on Mondays, Tuesdays, Wednesdays, Thursdays, Fridays & Saturdays. (Hold dose on Sundays)   VITAMIN C GUMMIE PO Chew 500 mg by mouth daily.         Outstanding Labs/Studies   none  Duration of Discharge Encounter   Greater than 30 minutes including physician time.  Mable Fill, PA-C 08/14/2020, 9:43 AM [phone]  I have personally seen and examined this patient. I agree with the assessment and plan as outlined above.  Stable today. Echo with normal function AVR Low grade fevers, Normal WBC. Normal U/A and chest x-ray. Likely viral etiology Discharge home today.   Lauree Chandler 08/14/2020 11:16 AM

## 2020-08-15 ENCOUNTER — Other Ambulatory Visit: Payer: Self-pay

## 2020-08-15 ENCOUNTER — Telehealth: Payer: Self-pay

## 2020-08-15 ENCOUNTER — Telehealth: Payer: Medicare HMO | Admitting: Thoracic Surgery (Cardiothoracic Vascular Surgery)

## 2020-08-15 DIAGNOSIS — J449 Chronic obstructive pulmonary disease, unspecified: Secondary | ICD-10-CM | POA: Diagnosis not present

## 2020-08-15 NOTE — Telephone Encounter (Signed)
Left message to call back  

## 2020-08-15 NOTE — Telephone Encounter (Signed)
  [facility]   MULTIDISCIPLINARY HEART VALVE TEAM   Attempted to contact patient regarding discharge from [facility] on 08/14/2020  Will review upcoming appointments, discharge instructions, medications, etc.  Left message that she will be called later today.

## 2020-08-15 NOTE — Telephone Encounter (Signed)
  [place] VALVE TEAM   Patient contacted regarding discharge from [facility] on 08/14/2020  Patient understands to follow up with provider Nell Range on 08/20/2020 at 1:00PM at [place].  Patient understands discharge instructions? yes Patient understands medications and regimen? yes Patient understands to bring all medications to this visit? yes   The patient reports she is still fatigued and her groins are a little tender but otherwise feels OK.  She is concerned because she ran a fever after her surgery. Her temperature is normal today. Gave her [employer] office number and instructed her to call if fever returns and exceeds 101 and she will speak to the on-call over the weekend if needed. She was grateful for call and agrees with plan.

## 2020-08-18 NOTE — Progress Notes (Signed)
HEART AND [place]                                     Cardiology Office Note:    Date:  08/20/2020   ID:  [name], DOB 1955/10/26, MRN [id number]  PCP:  Glenda Chroman, MD  [facility] Cardiologist:  Carlyle Dolly, MD  / Dr. Angelena Form & Dr. Cyndia Bent (TAVR) [facility] Electrophysiologist:  None   Referring MD: Glenda Chroman, MD   [facility] s/p TAVR  History of Present Illness:    Emily Rocha is a 65 y.o. female with a hx of CAD s/p CABG (2018) and recent PCI, lung adenocarcinoma s/p right upper lobectomy in 2020, COPD, prior stroke, PVD, carotid artery disease s/p R CEA (2016) and redo sternotomy for right subclavian/right carotid bypass (2019) with superficial sternal wound infection requiring wound debridement and wound VAC therapy, GERD and severe aortic stenosis s/p TAVR (08/12/20) who presents to clinic for follow up.  She has a history of coronary disease status post CABG x3 by Dr. Lucianne Lei trigt in 2018, redo sternotomy with aorto to right common carotid artery and right subclavian artery bypasses by Dr. Lucianne Lei trigt and Dr. Oneida Alar on 12/26/2017 for high-grade stenosis of the innominate artery, superficial sternal wound infection requiring wound debridement and wound VAC therapy on 01/06/2018 and 01/13/2018.  She was subsequently diagnosed with a right upper lobe pulmonary nodule and underwent VATS right upper lobectomy by Dr. Kipp Brood on 10/26/2018.  She also has a history of prior smoking and COPD, prior stroke, and severe aortic stenosis.  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  She now presents with progressive exertional dyspnea and fatigue.  Her most recent echocardiogram on 04/28/2020 showed a trileaflet, thickened and calcified aortic valve with restricted leaflet mobility.  The mean gradient was 43 mmHg and the peak 68 mmHg.  Dimensionless index was 0.23  consistent with severe aortic stenosis.  Left ventricular systolic function was low normal with ejection fraction of 55 to 60%.  Her gradients had increased compared to her previous echo in September 2021. Cardiac catheterization on 06/04/2020 showed severe left main stenosis. The diagonal and LAD filled from a patent vein graft to the diagonal branch.  The LIMA graft was atraumatic.  The chronically occluded left circumflex filled from a patent vein graft.  The RCA had a severe heavily calcific stenosis.  The mean gradient across aortic valve was measured at 40.3 mmHg with a peak to peak gradient of 55 mmHg and aortic valve area of 0.75 cm.  She subsequently underwent staged PCI of the RCA with orbital atherectomy and DES on 06/12/2020 and said that she is felt much better since then with a decrease in her shortness of breath and fatigue.   She was evaluated by the multidisciplinary valve team and underwent successful TAVR with a 23 mm Edwards Sapien 3 Ultra 23 THV via the TF approach on 08/12/20. Post operative echo showed EF 65%, normally functioning TAVR with a mean gradient of 8.1 mmHg and no PVL. She was continued on aspirin and plavix given recent PCI. She did have a low grade fever; follow up UA and CXR were normal. She was discharge on 02.   Today the patient presents to clinic for follow up. Here with  husband. Wearing 02 but thinks she can come off it soon since her 02 sats have been in 90s. Finally feeling better. Had been feeling a bit crummy with fevers but these have now resolved. No CP or SOB. No LE edema, orthopnea or PND. Some mild dizziness but no syncope. No blood in stool or urine. No palpitations.    Past Medical History:  Diagnosis Date   Anxiety    Asthma    COPD (chronic obstructive pulmonary disease) (HCC)    Coronary artery disease    hx CABG   GERD (gastroesophageal reflux disease)    History of CVA (cerebrovascular accident)    History of lung cancer 2020   Peripheral  vascular disease (HCC)    S/P TAVR (transcatheter aortic valve replacement) 08/12/2020   s/p TAVR with a 23 mm Edwards S3U via the TF approach by Dr. Angelena Form & Dr. Cyndia Bent   Severe aortic stenosis     Past Surgical History:  Procedure Laterality Date   AORTIC ARCH ANGIOGRAPHY N/A 11/04/2017   Procedure: AORTIC ARCH ANGIOGRAPHY;  Surgeon: Elam Dutch, MD;  Location: [place] [facility];  Service: Cardiovascular;  Laterality: N/A;   APPLICATION OF WOUND VAC N/A 01/06/2018   Procedure: APPLICATION OF WOUND VAC;  Surgeon: Ivin Poot, MD;  Location: [place];  Service: Thoracic;  Laterality: N/A;   APPLICATION OF WOUND VAC N/A 01/13/2018   Procedure: WOUND VAC CHANGE;  Surgeon: Ivin Poot, MD;  Location: [place];  Service: Thoracic;  Laterality: N/A;   CARDIAC CATHETERIZATION     CAROTID-SUBCLAVIAN BYPASS GRAFT Right 12/26/2017   Procedure: AORTIC TO RIGHT COMMON CAROTID AND RIGHT SUBCLAVIAN  ARTERY  BYPASS;  Surgeon: Elam Dutch, MD;  Location: [place];  Service: Vascular;  Laterality: Right;   CHOLECYSTECTOMY     CLOSURE OF DIAPHRAGM  12/26/2017   Procedure: REPAIR OF DIAPHRAGM;  Surgeon: Ivin Poot, MD;  Location: [place];  Service: Thoracic;;   COLONOSCOPY N/A 10/03/2014   Procedure: COLONOSCOPY;  Surgeon: Rogene Houston, MD;  [facility];  Service: Endoscopy;  Laterality: N/A;  730   CORONARY ARTERY BYPASS GRAFT N/A 12/14/2016   Procedure: CORONARY ARTERY BYPASS GRAFTING (CABG) x three , using left internal mammary artery and right leg greater saphenous vein harvested endoscopically;  Surgeon: Ivin Poot, MD;  Location: [place];  Service: Open Heart Surgery;  Laterality: N/A;   CORONARY ATHERECTOMY N/A 06/11/2020   Procedure: CORONARY ATHERECTOMY;  Surgeon: Burnell Blanks, MD;  Location: [place] [facility];  Service: Cardiovascular;  Laterality: N/A;   CORONARY STENT INTERVENTION N/A 06/11/2020   Procedure: CORONARY STENT INTERVENTION;  Surgeon:  Burnell Blanks, MD;  Location: [place] [facility];  Service: Cardiovascular;  Laterality: N/A;   ENDARTERECTOMY Right 12/06/2014   Procedure: ENDARTERECTOMY CAROTid;  Surgeon: Elam Dutch, MD;  [facility]: [facility];  Service: Vascular;  Laterality: Right;   ESOPHAGOGASTRODUODENOSCOPY N/A 12/03/2016   Procedure: ESOPHAGOGASTRODUODENOSCOPY (EGD);  Surgeon: Rogene Houston, MD;  [facility];  Service: Endoscopy;  Laterality: N/A;  7:30   INTRAVASCULAR IMAGING/OCT N/A 06/11/2020   Procedure: INTRAVASCULAR IMAGING/OCT;  Surgeon: Burnell Blanks, MD;  Location: [place] [facility];  Service: Cardiovascular;  Laterality: N/A;   INTRAVASCULAR PRESSURE WIRE/FFR STUDY N/A 06/04/2020   Procedure: INTRAVASCULAR PRESSURE WIRE/FFR STUDY;  Surgeon: Burnell Blanks, MD;  Location: [place] [facility];  Service: Cardiovascular;  Laterality: N/A;   LEFT HEART CATH AND CORONARY  ANGIOGRAPHY N/A 12/14/2016   Procedure: LEFT HEART CATH AND CORONARY ANGIOGRAPHY;  Surgeon: Burnell Blanks, MD;  Location: [place] [facility];  Service: Cardiovascular;  Laterality: N/A;   LOBECTOMY Right 10/26/2018   Procedure: RIGHT UPPER LOBECTOMY;  Surgeon: Lajuana Matte, MD;  Location: [place];  Service: Thoracic;  Laterality: Right;   PERIPHERAL VASCULAR CATHETERIZATION N/A 12/05/2014   Procedure:  Carotid Arch Angiography;  Surgeon: Conrad Vernon Center, MD;  Location: [place] [facility];  Service: Cardiovascular;  Laterality: N/A;   RIGHT/LEFT HEART CATH AND CORONARY/GRAFT ANGIOGRAPHY N/A 06/04/2020   Procedure: RIGHT/LEFT HEART CATH AND CORONARY/GRAFT ANGIOGRAPHY;  Surgeon: Burnell Blanks, MD;  Location: [place] [facility];  Service: Cardiovascular;  Laterality: N/A;   STERNAL WOUND DEBRIDEMENT N/A 01/06/2018   Procedure: STERNAL WOUND DEBRIDEMENT;  Surgeon: Ivin Poot, MD;  Location: [place];  Service: Thoracic;  Laterality: N/A;   STERNOTOMY N/A 12/26/2017   Procedure: REDO  STERNOTOMY;  Surgeon: Prescott Gum, Collier Salina, MD;  Location: [place];  Service: Thoracic;  Laterality: N/A;   TEE WITHOUT CARDIOVERSION N/A 12/14/2016   Procedure: TRANSESOPHAGEAL ECHOCARDIOGRAM (TEE);  Surgeon: Prescott Gum, Collier Salina, MD;  Location: [place];  Service: Open Heart Surgery;  Laterality: N/A;   TEE WITHOUT CARDIOVERSION N/A 08/12/2020   Procedure: TRANSESOPHAGEAL ECHOCARDIOGRAM (TEE);  Surgeon: Burnell Blanks, MD;  Location: [place] [facility];  Service: Open Heart Surgery;  Laterality: N/A;   TRANSCATHETER AORTIC VALVE REPLACEMENT, TRANSFEMORAL N/A 08/12/2020   Procedure: TRANSCATHETER AORTIC VALVE REPLACEMENT, TRANSFEMORAL;  Surgeon: Burnell Blanks, MD;  Location: [place] [facility];  Service: Open Heart Surgery;  Laterality: N/A;   VIDEO ASSISTED THORACOSCOPY (VATS)/WEDGE RESECTION Right 10/26/2018   Procedure: VIDEO ASSISTED THORACOSCOPY (VATS)/WEDGE RESECTION;  Surgeon: Lajuana Matte, MD;  Location: [place];  Service: Thoracic;  Laterality: Right;   VIDEO BRONCHOSCOPY N/A 10/26/2018   Procedure: VIDEO BRONCHOSCOPY;  Surgeon: Lajuana Matte, MD;  Location: [facility];  Service: Thoracic;  Laterality: N/A;    Current Medications: Current Meds  Medication Sig   albuterol (PROVENTIL) (2.5 MG/3ML) 0.083% nebulizer solution Take 2.5 mg by nebulization every 4 (four) hours as needed for wheezing or shortness of breath.   albuterol (VENTOLIN HFA) 108 (90 Base) MCG/ACT inhaler Inhale 1-2 puffs into the lungs every 6 (six) hours as needed for wheezing or shortness of breath.   ALPRAZolam (XANAX) 0.5 MG tablet Take 0.5 mg by mouth 3 (three) times daily.   Ascorbic Acid (VITAMIN C GUMMIE PO) Chew 500 mg by mouth daily.   aspirin EC 81 MG tablet Take 1 tablet (81 mg total) by mouth daily. Swallow whole.   atorvastatin (LIPITOR) 40 MG tablet Take 1 tablet (40 mg total) daily by mouth.   cholecalciferol (VITAMIN D3) 25 MCG (1000 UNIT) tablet Take 1,000 Units by mouth in the morning.    clopidogrel (PLAVIX) 75 MG tablet Take 1 tablet (75 mg total) by mouth daily.   diphenhydramine-acetaminophen (TYLENOL PM) 25-500 MG TABS tablet Take 2 tablets by mouth at bedtime.   isosorbide mononitrate (IMDUR) 30 MG 24 hr tablet Take 15 mg by mouth daily.   metoprolol succinate (TOPROL-XL) 50 MG 24 hr tablet Take 50 mg by mouth daily. Take with or immediately following a meal.   Multiple Vitamin (MULTIVITAMIN WITH MINERALS) TABS tablet Take 1 tablet by mouth daily.   pantoprazole (PROTONIX) 40 MG tablet Take 40 mg by mouth in the morning.   potassium chloride SA (KLOR-CON) 20 MEQ tablet  Take 20 mEq by mouth in the morning.   Tiotropium Bromide-Olodaterol (STIOLTO RESPIMAT) 2.5-2.5 MCG/ACT AERS Inhale 2 puffs into the lungs daily.   torsemide (DEMADEX) 20 MG tablet Take 40 mg by mouth See admin instructions. Take 2 tablets (40 mg) by mouth scheduled on Mondays, Tuesdays, Wednesdays, Thursdays, Fridays & Saturdays. (Hold dose on Sundays)     Allergies:   Patient has no known allergies.   Social History   Socioeconomic History   Marital status: Married    Spouse name: Not on file   Number of children: Not on file   Years of education: Not on file   Highest education level: Not on file  Occupational History   Not on file  Tobacco Use   Smoking status: Former    Packs/day: 2.00    Years: 40.00    Pack years: 80.00    Types: Cigarettes    Quit date: 10/24/2015    Years since quitting: 4.8   Smokeless tobacco: Never  Vaping Use   Vaping Use: Former   Start date: 10/23/2013   Quit date: 10/24/2015  Substance and Sexual Activity   Alcohol use: No    Alcohol/week: 0.0 standard drinks   Drug use: No   Sexual activity: Not on file  Other Topics Concern   Not on file  Social History Narrative   Not on file   Social Determinants of Health   Financial Resource Strain: Not on file  Food Insecurity: Not on file  Transportation Needs: Not on file  Physical Activity: Not on file   Stress: Not on file  Social Connections: Not on file     Family History: The patient's family history includes Cancer in her mother; Diabetes in her mother; Heart attack in her father and mother; Heart disease in her father and mother.  ROS:   Please see the history of present illness.    All other systems reviewed and are negative.  EKGs/Labs/Other Studies Reviewed:    The following studies were reviewed today:  [facility]  TAVR OPERATIVE NOTE     Date of Procedure:                08/12/2020   Preoperative Diagnosis:      Severe Aortic Stenosis   Postoperative Diagnosis:    Same   Procedure:        Transcatheter Aortic Valve Replacement - Transfemoral Approach             Edwards Sapien 3 THV (size 23 mm, model # [id number], serial # [id number])   Co-Surgeons:                        Lauree Chandler, MD and Gaye Pollack, MD   Anesthesiologist:                  Roanna Banning   Echocardiographer:              Cro[place]   Pre-operative Echo Findings: Severe aortic stenosis Normal left ventricular systolic function   Post-operative Echo Findings: No paravalvular leak Normal left ventricular systolic function     _______________________     Echo 08/12/20: IMPRESSIONS  1. Left ventricular ejection fraction, by estimation, is 65 to 70%. The  left ventricle has normal function. The left ventricle has no regional  wall motion abnormalities. Left ventricular diastolic parameters are  consistent with Grade II diastolic  dysfunction (pseudonormalization). Elevated left atrial pressure.  2. Right ventricular systolic function is normal. The right ventricular  size is normal.   3. Left atrial size was moderately dilated.   4. The mitral valve is normal in structure. Mild mitral valve  regurgitation.   5. The aortic valve has been repaired/replaced. Aortic valve  regurgitation is not visualized. There is a 23 mm Sapien prosthetic (TAVR)  valve present in  the aortic position. Procedure Date: 08/12/2020. Echo  findings are consistent with normal structure  and function of the aortic valve prosthesis. Aortic valve mean gradient  measures 8.1 mmHg. Aortic valve Vmax measures 2.05 m/s. Aortic valve  acceleration time measures 95 msec.   6. The inferior vena cava is normal in size with greater than 50%  respiratory variability, suggesting right atrial pressure of 3 mmHg.   EKG:  EKG is ordered today.  The ekg ordered today demonstrates sinus HR 81  Recent Labs: 10/28/2019: TSH 1.767 11/12/2019: B Natriuretic Peptide 337.0 08/08/2020: ALT 12 08/13/2020: Magnesium 1.9 08/14/2020: BUN 5; Creatinine, Ser 0.47; Hemoglobin 9.3; Platelets 198; Potassium 4.5; Sodium 132  Recent Lipid Panel    Component Value Date/Time   CHOL 205 (H) 12/13/2016 1023   TRIG 151 (H) 10/24/2019 1316   HDL 68 12/13/2016 1023   CHOLHDL 3.0 12/13/2016 1023   VLDL 20 12/13/2016 1023   LDLCALC 117 (H) 12/13/2016 1023     Risk Assessment/Calculations:       Physical Exam:    VS:  BP 120/70   Pulse 81   Ht 5\' 4"  (1.626 m)   Wt 170 lb 9.6 oz (77.4 kg)   SpO2 98%   BMI 29.28 kg/m     Wt Readings from Last 3 Encounters:  08/20/20 170 lb 9.6 oz (77.4 kg)  08/14/20 179 lb 14.3 oz (81.6 kg)  08/08/20 170 lb 1.6 oz (77.2 kg)     GEN:  Well nourished, well developed in no acute distress HEENT: Normal NECK: No JVD; LYMPHATICS: No lymphadenopathy CARDIAC: very soft flow murmur at RUSB. No rubs, gallops RESPIRATORY:  Clear to auscultation without rales, wheezing or rhonchi  ABDOMEN: Soft, non-tender, non-distended MUSCULOSKELETAL:  No edema; No deformity  SKIN: Warm and dry.  Groin sites clear without hematoma or ecchymosis  NEUROLOGIC:  Alert and oriented x 3 PSYCHIATRIC:  Normal affect   ASSESSMENT:    1. S/P TAVR (transcatheter aortic valve replacement)   2. Coronary artery disease involving native heart without angina pectoris, unspecified vessel or lesion  type   3. Essential hypertension   4. Fever, unspecified fever cause    PLAN:    In order of problems listed above:  Severe AS s/p TAVR: doing well. Groin sites healing well. ECG with no HAVB. SBE prophylaxis discussed; the patient is edentulous and does not go to the dentist. Continue on aspirin and plavix. She can stop plavix after 6 months out from TAVR (12/28). She has follow up and echo set up for next month with Dr. Harl Bowie, which is closer to her house.    CAD s/p CABG: pre TAVR cath showed severe left main stenosis. The diagonal and LAD filled from a patent vein graft to the diagonal branch.  The LIMA graft was atraumatic.  The chronically occluded left circumflex filled from a patent vein graft.  The RCA had a severe heavily calcific stenosis. She later underwent successful orbital atherectomy and DES placement of proximal RCA on 06/12/20. Continue aspirin and plavix. Continue imdur, BB and statin. As above, she can stop plavix  at the end of December.    HTN: BP well controlled today. No changes made.    Fever: this has resolved.    Cardiac Rehabilitation Eligibility Assessment: The patient is ready to start Cardiac Rehab from a cardiac standpoint.     Medication Adjustments/Labs and Tests Ordered: Current medicines are reviewed at length with the patient today.  Concerns regarding medicines are outlined above.  Orders Placed This Encounter  Procedures   EKG 12-Lead    No orders of the defined types were placed in this encounter.   Patient Instructions  Medication Instructions:  No changes - stop Plavix after 02/11/21 as instructed.   Lab Work: none   Testing/Procedures: none   Follow-Up: As scheduled.   Other Instructions     Signed, Angelena Form, PA-C  08/20/2020 4:55 PM    [facility]

## 2020-08-20 ENCOUNTER — Other Ambulatory Visit: Payer: Self-pay

## 2020-08-20 ENCOUNTER — Encounter: Payer: Self-pay | Admitting: Physician Assistant

## 2020-08-20 ENCOUNTER — Ambulatory Visit: Payer: Medicare HMO | Admitting: Physician Assistant

## 2020-08-20 VITALS — BP 120/70 | HR 81 | Ht 64.0 in | Wt 170.6 lb

## 2020-08-20 DIAGNOSIS — Z952 Presence of prosthetic heart valve: Secondary | ICD-10-CM | POA: Diagnosis not present

## 2020-08-20 DIAGNOSIS — I10 Essential (primary) hypertension: Secondary | ICD-10-CM

## 2020-08-20 DIAGNOSIS — R509 Fever, unspecified: Secondary | ICD-10-CM

## 2020-08-20 DIAGNOSIS — I2510 Atherosclerotic heart disease of native coronary artery without angina pectoris: Secondary | ICD-10-CM | POA: Diagnosis not present

## 2020-08-20 NOTE — Patient Instructions (Signed)
Medication Instructions:  No changes - stop Plavix after 02/11/21 as instructed.   Lab Work: none   Testing/Procedures: none   Follow-Up: As scheduled.   Other Instructions

## 2020-08-28 DIAGNOSIS — I25119 Atherosclerotic heart disease of native coronary artery with unspecified angina pectoris: Secondary | ICD-10-CM | POA: Diagnosis not present

## 2020-08-28 DIAGNOSIS — I4891 Unspecified atrial fibrillation: Secondary | ICD-10-CM | POA: Diagnosis not present

## 2020-08-28 DIAGNOSIS — Z299 Encounter for prophylactic measures, unspecified: Secondary | ICD-10-CM | POA: Diagnosis not present

## 2020-08-28 DIAGNOSIS — J9611 Chronic respiratory failure with hypoxia: Secondary | ICD-10-CM | POA: Diagnosis not present

## 2020-08-28 DIAGNOSIS — I739 Peripheral vascular disease, unspecified: Secondary | ICD-10-CM | POA: Diagnosis not present

## 2020-08-28 DIAGNOSIS — Z6830 Body mass index (BMI) 30.0-30.9, adult: Secondary | ICD-10-CM | POA: Diagnosis not present

## 2020-08-28 DIAGNOSIS — J439 Emphysema, unspecified: Secondary | ICD-10-CM | POA: Diagnosis not present

## 2020-09-15 ENCOUNTER — Other Ambulatory Visit (HOSPITAL_COMMUNITY): Payer: Self-pay | Admitting: *Deleted

## 2020-09-15 ENCOUNTER — Ambulatory Visit (HOSPITAL_COMMUNITY): Payer: Medicare HMO

## 2020-09-15 DIAGNOSIS — J449 Chronic obstructive pulmonary disease, unspecified: Secondary | ICD-10-CM | POA: Diagnosis not present

## 2020-09-18 ENCOUNTER — Ambulatory Visit: Payer: Medicare HMO | Admitting: Cardiology

## 2020-09-19 DIAGNOSIS — R5383 Other fatigue: Secondary | ICD-10-CM | POA: Diagnosis not present

## 2020-09-19 DIAGNOSIS — Z299 Encounter for prophylactic measures, unspecified: Secondary | ICD-10-CM | POA: Diagnosis not present

## 2020-09-19 DIAGNOSIS — Z1339 Encounter for screening examination for other mental health and behavioral disorders: Secondary | ICD-10-CM | POA: Diagnosis not present

## 2020-09-19 DIAGNOSIS — E6609 Other obesity due to excess calories: Secondary | ICD-10-CM | POA: Diagnosis not present

## 2020-09-19 DIAGNOSIS — E7800 Pure hypercholesterolemia, unspecified: Secondary | ICD-10-CM | POA: Diagnosis not present

## 2020-09-19 DIAGNOSIS — Z6830 Body mass index (BMI) 30.0-30.9, adult: Secondary | ICD-10-CM | POA: Diagnosis not present

## 2020-09-19 DIAGNOSIS — Z7189 Other specified counseling: Secondary | ICD-10-CM | POA: Diagnosis not present

## 2020-09-19 DIAGNOSIS — Z1331 Encounter for screening for depression: Secondary | ICD-10-CM | POA: Diagnosis not present

## 2020-09-19 DIAGNOSIS — Z0000 Encounter for general adult medical examination without abnormal findings: Secondary | ICD-10-CM | POA: Diagnosis not present

## 2020-09-19 DIAGNOSIS — Z79899 Other long term (current) drug therapy: Secondary | ICD-10-CM | POA: Diagnosis not present

## 2020-09-25 ENCOUNTER — Other Ambulatory Visit (HOSPITAL_COMMUNITY): Payer: Self-pay | Admitting: Student

## 2020-09-25 DIAGNOSIS — Z1231 Encounter for screening mammogram for malignant neoplasm of breast: Secondary | ICD-10-CM

## 2020-10-02 ENCOUNTER — Other Ambulatory Visit: Payer: Self-pay

## 2020-10-02 ENCOUNTER — Ambulatory Visit (HOSPITAL_COMMUNITY)
Admission: RE | Admit: 2020-10-02 | Discharge: 2020-10-02 | Disposition: A | Discharge status: Home / Self Care | Payer: Medicare HMO | Source: Ambulatory Visit | Attending: Student | Admitting: Student

## 2020-10-02 DIAGNOSIS — Z1231 Encounter for screening mammogram for malignant neoplasm of breast: Secondary | ICD-10-CM | POA: Diagnosis not present

## 2020-10-06 ENCOUNTER — Other Ambulatory Visit: Payer: Self-pay

## 2020-10-06 ENCOUNTER — Ambulatory Visit (HOSPITAL_COMMUNITY)
Admission: RE | Admit: 2020-10-06 | Discharge: 2020-10-06 | Disposition: A | Discharge status: Home / Self Care | Payer: Medicare HMO | Source: Ambulatory Visit | Attending: Physician Assistant | Admitting: Physician Assistant

## 2020-10-06 DIAGNOSIS — Z952 Presence of prosthetic heart valve: Secondary | ICD-10-CM | POA: Insufficient documentation

## 2020-10-06 LAB — ECHOCARDIOGRAM COMPLETE
AR max vel: 1.37 cm2
AV Area VTI: 1.39 cm2
AV Area mean vel: 1.27 cm2
AV Mean grad: 11 mmHg
AV Peak grad: 19.4 mmHg
Ao pk vel: 2.2 m/s
Area-P 1/2: 3.72 cm2
S' Lateral: 2.7 cm

## 2020-10-06 NOTE — Progress Notes (Signed)
*  PRELIMINARY RESULTS* Echocardiogram 2D Echocardiogram has been performed.  Samuel Germany 10/06/2020, 3:05 PM

## 2020-10-16 ENCOUNTER — Ambulatory Visit: Payer: Medicare HMO | Admitting: Student

## 2020-10-16 ENCOUNTER — Encounter: Payer: Self-pay | Admitting: Student

## 2020-10-16 VITALS — BP 104/68 | HR 82 | Ht 64.0 in | Wt 164.6 lb

## 2020-10-16 DIAGNOSIS — J449 Chronic obstructive pulmonary disease, unspecified: Secondary | ICD-10-CM | POA: Diagnosis not present

## 2020-10-16 DIAGNOSIS — I350 Nonrheumatic aortic (valve) stenosis: Secondary | ICD-10-CM | POA: Diagnosis not present

## 2020-10-16 DIAGNOSIS — I2510 Atherosclerotic heart disease of native coronary artery without angina pectoris: Secondary | ICD-10-CM | POA: Diagnosis not present

## 2020-10-16 DIAGNOSIS — I5032 Chronic diastolic (congestive) heart failure: Secondary | ICD-10-CM

## 2020-10-16 NOTE — Patient Instructions (Addendum)
Medication Instructions:  STOP Imdur    *If you need a refill on your cardiac medications before your next appointment, please call your pharmacy*   Lab Work: None today  If you have labs (blood work) drawn today and your tests are completely normal, you will receive your results only by: [place] (if you have MyChart) OR A paper copy in the mail If you have any lab test that is abnormal or we need to change your treatment, we will call you to review the results.   Testing/Procedures: None today    Follow-Up: At [facility], you and your health needs are our priority.  As part of our continuing mission to provide you with exceptional heart care, we have created designated Provider Care Teams.  These Care Teams include your primary Cardiologist (physician) and Advanced Practice Providers (APPs -  Physician Assistants and Nurse Practitioners) who all work together to provide you with the care you need, when you need it.  We recommend signing up for the patient portal called "MyChart".  Sign up information is provided on this After Visit Summary.  MyChart is used to connect with patients for Virtual Visits (Telemedicine).  Patients are able to view lab/test results, encounter notes, upcoming appointments, etc.  Non-urgent messages can be sent to your provider as well.   To learn more about what you can do with MyChart, go to [email or web address].    Your next appointment:   3-4 month(s)  The format for your next appointment:   In Person  Provider:   Carlyle Dolly, MD or Bernerd Pho, PA-C   Other Instructions None

## 2020-10-16 NOTE — Progress Notes (Addendum)
Cardiology Office Note    Date:  10/16/2020   ID:  KAHLEA Rocha, DOB [birth year], MRN [id number]  PCP:  Glenda Chroman, MD  Cardiologist: Carlyle Dolly, MD   Structural Heart: Dr. Angelena Form  Chief Complaint  Patient presents with   Follow-up    4 month visit    History of Present Illness:    Emily Rocha is a 65 y.o. female with past medical history of CAD (s/p NSTEMI in 11/2016 and required CABG with LIMA-LAD, SVG-LCx and SVG-D1 and complicated by cardiogenic shock, cath in 05/2020 showing severe RCA stenosis and treated with orbital atherectomy and stent placement), history of ischemic cardiomyopathy (EF 45-50% in 11/2016, normalized by repeat imaging), severe AS (s/p TAVR on 08/12/2020), HTN, HLD, COPD, history of lung cancer (s/p right upper lobectomy in 2020) and history of TIA's (s/p right subclavian and right common carotid bypass in 12/2017) who presents to the office today for 33-month follow-up.   She was examined by Nell Range, PA-C in 08/2020 for follow-up from her recent TAVR and reported initially having intermittent fevers following her hospitalization for TAVR but this had improved. Denied any chest pain or dyspnea. She was continued on DAPT with ASA and Plavix and was mentioned to stop Plavix after 02/11/2021 (will verify given recent stent placement in 05/2020). She did have a follow-up echocardiogram on 8/22 which showed a preserved EF of 60-65% with no regional WMA. Was noted to have mild LVH and mild AI with a mean gradient of 11 mmHg.   In talking with the patient today, she reports feeling the "best she has in years". She denies any recent chest pain or palpitations. No reported orthopnea, PND or lower extremity edema. She does have NYHA class II dyspnea and feels like her COPD likely contributes to this. She does use her Albuterol as needed during the day. Scheduled to start Cardiac Rehab later this month.   She remains on ASA and Plavix and denies any recent  melena, hematochezia or hematuria.  She does report intermittent dizziness with changing positions very quickly.   Past Medical History:  Diagnosis Date   Anxiety    Asthma    COPD (chronic obstructive pulmonary disease) ([place])    Coronary artery disease    a. s/p NSTEMI in 11/2016 and required CABG with LIMA-LAD, SVG-LCx and SVG-D1 and complicated by cardiogenic shock b. cath in 05/2020 showing severe RCA stenosis and treated with orbital atherectomy and stent placement   GERD (gastroesophageal reflux disease)    History of CVA (cerebrovascular accident)    History of lung cancer 2020   Peripheral vascular disease ([place])    S/P TAVR (transcatheter aortic valve replacement) 08/12/2020   s/p TAVR with a 23 mm Edwards S3U via the TF approach by Dr. Angelena Form & Dr. Cyndia Bent   Severe aortic stenosis     Past Surgical History:  Procedure Laterality Date   AORTIC ARCH ANGIOGRAPHY N/A 11/04/2017   Procedure: AORTIC ARCH ANGIOGRAPHY;  Surgeon: Elam Dutch, MD;  Location: [facility];  Service: Cardiovascular;  Laterality: N/A;   APPLICATION OF WOUND VAC N/A 01/06/2018   Procedure: APPLICATION OF WOUND VAC;  Surgeon: Ivin Poot, MD;  Location: [facility];  Service: Thoracic;  Laterality: N/A;   APPLICATION OF WOUND VAC N/A 01/13/2018   Procedure: WOUND VAC CHANGE;  Surgeon: Ivin Poot, MD;  Location: [facility];  Service: Thoracic;  Laterality: N/A;   CARDIAC CATHETERIZATION     CAROTID-SUBCLAVIAN  BYPASS GRAFT Right 12/26/2017   Procedure: AORTIC TO RIGHT COMMON CAROTID AND RIGHT SUBCLAVIAN  ARTERY  BYPASS;  Surgeon: Elam Dutch, MD;  Location: [facility];  Service: Vascular;  Laterality: Right;   CHOLECYSTECTOMY     CLOSURE OF DIAPHRAGM  12/26/2017   Procedure: REPAIR OF DIAPHRAGM;  Surgeon: Ivin Poot, MD;  Location: [facility];  Service: Thoracic;;   COLONOSCOPY N/A 10/03/2014   Procedure: COLONOSCOPY;  Surgeon: Rogene Houston, MD;  [facility];  Service: Endoscopy;   Laterality: N/A;  730   CORONARY ARTERY BYPASS GRAFT N/A 12/14/2016   Procedure: CORONARY ARTERY BYPASS GRAFTING (CABG) x three , using left internal mammary artery and right leg greater saphenous vein harvested endoscopically;  Surgeon: Ivin Poot, MD;  Location: [facility];  Service: Open Heart Surgery;  Laterality: N/A;   CORONARY ATHERECTOMY N/A 06/11/2020   Procedure: CORONARY ATHERECTOMY;  Surgeon: Burnell Blanks, MD;  Location: [facility];  Service: Cardiovascular;  Laterality: N/A;   CORONARY STENT INTERVENTION N/A 06/11/2020   Procedure: CORONARY STENT INTERVENTION;  Surgeon: Burnell Blanks, MD;  Location: [facility];  Service: Cardiovascular;  Laterality: N/A;   ENDARTERECTOMY Right 12/06/2014   Procedure: ENDARTERECTOMY CAROTid;  Surgeon: Elam Dutch, MD;  Location: [facility];  Service: Vascular;  Laterality: Right;   ESOPHAGOGASTRODUODENOSCOPY N/A 12/03/2016   Procedure: ESOPHAGOGASTRODUODENOSCOPY (EGD);  Surgeon: Rogene Houston, MD;  [facility];  Service: Endoscopy;  Laterality: N/A;  7:30   INTRAVASCULAR IMAGING/OCT N/A 06/11/2020   Procedure: INTRAVASCULAR IMAGING/OCT;  Surgeon: Burnell Blanks, MD;  Location: [facility];  Service: Cardiovascular;  Laterality: N/A;   INTRAVASCULAR PRESSURE WIRE/FFR STUDY N/A 06/04/2020   Procedure: INTRAVASCULAR PRESSURE WIRE/FFR STUDY;  Surgeon: Burnell Blanks, MD;  Location: [facility];  Service: Cardiovascular;  Laterality: N/A;   LEFT HEART CATH AND CORONARY ANGIOGRAPHY N/A 12/14/2016   Procedure: LEFT HEART CATH AND CORONARY ANGIOGRAPHY;  Surgeon: Burnell Blanks, MD;  Location: [facility];  Service: Cardiovascular;  Laterality: N/A;   LOBECTOMY Right 10/26/2018   Procedure: RIGHT UPPER LOBECTOMY;  Surgeon: Lajuana Matte, MD;  Location: [place] [facility];  Service: Thoracic;  Laterality: Right;   PERIPHERAL VASCULAR CATHETERIZATION N/A 12/05/2014    Procedure:  Carotid Arch Angiography;  Surgeon: Conrad Cumberland, MD;  Location: [facility];  Service: Cardiovascular;  Laterality: N/A;   RIGHT/LEFT HEART CATH AND CORONARY/GRAFT ANGIOGRAPHY N/A 06/04/2020   Procedure: RIGHT/LEFT HEART CATH AND CORONARY/GRAFT ANGIOGRAPHY;  Surgeon: Burnell Blanks, MD;  Location: [facility];  Service: Cardiovascular;  Laterality: N/A;   STERNAL WOUND DEBRIDEMENT N/A 01/06/2018   Procedure: STERNAL WOUND DEBRIDEMENT;  Surgeon: Ivin Poot, MD;  Location: [facility];  Service: Thoracic;  Laterality: N/A;   STERNOTOMY N/A 12/26/2017   Procedure: REDO STERNOTOMY;  Surgeon: Prescott Gum, Collier Salina, MD;  Location: [facility];  Service: Thoracic;  Laterality: N/A;   TEE WITHOUT CARDIOVERSION N/A 12/14/2016   Procedure: TRANSESOPHAGEAL ECHOCARDIOGRAM (TEE);  Surgeon: Prescott Gum, Collier Salina, MD;  Location: [facility];  Service: Open Heart Surgery;  Laterality: N/A;   TEE WITHOUT CARDIOVERSION N/A 08/12/2020   Procedure: TRANSESOPHAGEAL ECHOCARDIOGRAM (TEE);  Surgeon: Burnell Blanks, MD;  Location: [facility];  Service: Open Heart Surgery;  Laterality: N/A;   TRANSCATHETER AORTIC VALVE REPLACEMENT, TRANSFEMORAL N/A 08/12/2020   Procedure: TRANSCATHETER AORTIC VALVE REPLACEMENT, TRANSFEMORAL;  Surgeon: Burnell Blanks, MD;  Location:  [facility];  Service: Open Heart Surgery;  Laterality: N/A;   VIDEO ASSISTED THORACOSCOPY (VATS)/WEDGE RESECTION Right 10/26/2018   Procedure: VIDEO ASSISTED THORACOSCOPY (VATS)/WEDGE RESECTION;  Surgeon: Lajuana Matte, MD;  Location: [facility];  Service: Thoracic;  Laterality: Right;   VIDEO BRONCHOSCOPY N/A 10/26/2018   Procedure: VIDEO BRONCHOSCOPY;  Surgeon: Lajuana Matte, MD;  [facility];  Service: Thoracic;  Laterality: N/A;    Current Medications: Outpatient Medications Prior to Visit  Medication Sig Dispense Refill   albuterol (VENTOLIN HFA) 108 (90 Base) MCG/ACT inhaler Inhale 1-2 puffs into  the lungs every 6 (six) hours as needed for wheezing or shortness of breath.     ALPRAZolam (XANAX) 0.5 MG tablet Take 0.5 mg by mouth 3 (three) times daily.     Ascorbic Acid (VITAMIN C GUMMIE PO) Chew 500 mg by mouth daily.     aspirin EC 81 MG tablet Take 1 tablet (81 mg total) by mouth daily. Swallow whole. 90 tablet 3   atorvastatin (LIPITOR) 40 MG tablet Take 1 tablet (40 mg total) daily by mouth. 30 tablet 1   cholecalciferol (VITAMIN D3) 25 MCG (1000 UNIT) tablet Take 1,000 Units by mouth in the morning.     clopidogrel (PLAVIX) 75 MG tablet Take 1 tablet (75 mg total) by mouth daily. 90 tablet 1   diphenhydramine-acetaminophen (TYLENOL PM) 25-500 MG TABS tablet Take 2 tablets by mouth at bedtime.     metoprolol succinate (TOPROL-XL) 50 MG 24 hr tablet Take 50 mg by mouth daily. Take with or immediately following a meal.     Multiple Vitamin (MULTIVITAMIN WITH MINERALS) TABS tablet Take 1 tablet by mouth daily.     pantoprazole (PROTONIX) 40 MG tablet Take 40 mg by mouth in the morning.     potassium chloride SA (KLOR-CON) 20 MEQ tablet Take 20 mEq by mouth in the morning.     Tiotropium Bromide-Olodaterol (STIOLTO RESPIMAT) 2.5-2.5 MCG/ACT AERS Inhale 2 puffs into the lungs daily. 1 each 4   torsemide (DEMADEX) 20 MG tablet Take 40 mg by mouth See admin instructions. Take 2 tablets (40 mg) by mouth scheduled on Mondays, Tuesdays, Wednesdays, Thursdays, Fridays & Saturdays. (Hold dose on Sundays)     isosorbide mononitrate (IMDUR) 30 MG 24 hr tablet Take 15 mg by mouth daily.     albuterol (PROVENTIL) (2.5 MG/3ML) 0.083% nebulizer solution Take 2.5 mg by nebulization every 4 (four) hours as needed for wheezing or shortness of breath. (Patient not taking: Reported on 10/16/2020)     No facility-administered medications prior to visit.     Allergies:   Patient has no known allergies.   Social History   Socioeconomic History   Marital status: Married    Spouse name: Not on file   Number  of children: Not on file   Years of education: Not on file   Highest education level: Not on file  Occupational History   Not on file  Tobacco Use   Smoking status: Former    Packs/day: 2.00    Years: 40.00    Pack years: 80.00    Types: Cigarettes    Quit date: 10/24/2015    Years since quitting: 4.9   Smokeless tobacco: Never  Vaping Use   Vaping Use: Former   Start date: 10/23/2013   Quit date: 10/24/2015  Substance and Sexual Activity   Alcohol use: No    Alcohol/week: 0.0 standard drinks   Drug use: No   Sexual activity:  Not on file  Other Topics Concern   Not on file  Social History Narrative   Not on file   Social Determinants of Health   Financial Resource Strain: Not on file  Food Insecurity: Not on file  Transportation Needs: Not on file  Physical Activity: Not on file  Stress: Not on file  Social Connections: Not on file     Family History:  The patient's family history includes Cancer in her mother; Diabetes in her mother; Heart attack in her father and mother; Heart disease in her father and mother.   Review of Systems:    Please see the history of present illness.     All other systems reviewed and are otherwise negative except as noted above.   Physical Exam:    VS:  BP 104/68   Pulse 82   Ht 5\' 4"  (1.626 m)   Wt 164 lb 9.6 oz (74.7 kg)   SpO2 92%   BMI 28.25 kg/m    General: Well developed, well nourished,female appearing in no acute distress. Head: Normocephalic, atraumatic. Neck: No carotid bruits. JVD not elevated.  Lungs: Respirations regular and unlabored, without wheezes or rales.  Heart: Regular rate and rhythm. No S3 or S4.  No murmur, no rubs, or gallops appreciated. Abdomen: Appears non-distended. No obvious abdominal masses. Msk:  Strength and tone appear normal for age. No obvious joint deformities or effusions. Extremities: No clubbing or cyanosis. No pitting edema.  Distal pedal pulses are 2+ bilaterally. Neuro: Alert and  oriented X 3. Moves all extremities spontaneously. No focal deficits noted. Psych:  Responds to questions appropriately with a normal affect. Skin: No rashes or lesions noted  Wt Readings from Last 3 Encounters:  10/16/20 164 lb 9.6 oz (74.7 kg)  08/20/20 170 lb 9.6 oz (77.4 kg)  08/14/20 179 lb 14.3 oz (81.6 kg)     Studies/Labs Reviewed:   EKG:  EKG is not ordered today.    Recent Labs: 10/28/2019: TSH 1.767 11/12/2019: B Natriuretic Peptide 337.0 08/08/2020: ALT 12 08/13/2020: Magnesium 1.9 08/14/2020: BUN 5; Creatinine, Ser 0.47; Hemoglobin 9.3; Platelets 198; Potassium 4.5; Sodium 132   Lipid Panel    Component Value Date/Time   CHOL 205 (H) 12/13/2016 1023   TRIG 151 (H) 10/24/2019 1316   HDL 68 12/13/2016 1023   CHOLHDL 3.0 12/13/2016 1023   VLDL 20 12/13/2016 1023   LDLCALC 117 (H) 12/13/2016 1023    Additional studies/ records that were reviewed today include:   Cardiac Catheterization: 05/2020 Ost LM to LM lesion is 99% stenosed. Ost 2nd Diag to 2nd Diag lesion is 40% stenosed. Mid LAD lesion is 30% stenosed. Prox LAD to Mid LAD lesion is 40% stenosed. Ost Cx to Prox Cx lesion is 100% stenosed. Mid RCA lesion is 30% stenosed. Prox RCA lesion is 80% stenosed. Ost RPDA to RPDA lesion is 90% stenosed. Dist RCA lesion is 50% stenosed. 2nd Mrg lesion is 50% stenosed. Mid LM to Prox LAD lesion is 95% stenosed. Mid Graft to Insertion lesion is 100% stenosed.   1. Severe left main stenosis 2. Severe ostial LAD stenosis. Patent Vein graft to the Diagonal branch that fills the LAD. The LIMA in injected and does not fill the LAD although it may just be atretic distally. It is not clear that the LIMA was anastomosed to the LAD. The operative note indicates the LIMA was used.  3. Chronic occlusion ostial Circumflex. Patent vein graft to the obtuse marginal branch with moderate disease  in the obtuse marginal branch.  4. The RCA is a large dominant vessel. There is a heavily  calcified proximal stenosis that appears to be severe angiographically. RFR with flow wire was 0.89 suggesting the lesion is flow limiting.  5. Severe aortic stenosis (mean gradient 40.3 mmHg, peak to peak gradient 55 mmHg, AVA 0.75 cm2)   Recommendations: She will need to have staged PCI of the RCA with orbital atherectomy and stenting. I will plan this for next week. After the PCI, will then continue planning for TAVR with CT scans.   Coronary Atherectomy: 05/2020 Ost RPDA to RPDA lesion is 90% stenosed. Dist RCA lesion is 50% stenosed. Mid RCA lesion is 30% stenosed. Prox RCA lesion is 80% stenosed. Post intervention, there is a 0% residual stenosis. A drug-eluting stent was successfully placed using a SYNERGY XD [id number].   1. Severe, heavily calcified stenosis in the proximal to mid RCA 2. Successful PTCA/orbital atherectomy/stenting proximal to mid RCA   Recommendations: Will continue DAPT with ASA and Plavix. Continue workup for TAVR    Echocardiogram: 09/2020 IMPRESSIONS     1. Left ventricular ejection fraction, by estimation, is 60 to 65%. The  left ventricle has normal function. The left ventricle has no regional  wall motion abnormalities. There is mild left ventricular hypertrophy.  Left ventricular diastolic parameters  were normal.   2. Right ventricular systolic function is normal. The right ventricular  size is normal. There is normal pulmonary artery systolic pressure. The  estimated right ventricular systolic pressure is 70.3 mmHg.   3. Left atrial size was upper normal.   4. The mitral valve is abnormal. Trivial mitral valve regurgitation.   5. The aortic valve has been repaired/replaced. Aortic valve  regurgitation is mild. There is a 23 mm Sapien prosthetic (TAVR) valve  present in the aortic position. Aortic valve mean gradient measures 11.0  mmHg.   6. The inferior vena cava is normal in size with greater than 50%  respiratory variability, suggesting right  atrial pressure of 3 mmHg.   Assessment:    1. Coronary artery disease involving native coronary artery of native heart without angina pectoris   2. Chronic diastolic CHF (congestive heart failure) ([place])   3. Severe aortic stenosis   4. Chronic obstructive pulmonary disease, unspecified COPD type ([place])      Plan:   In order of problems listed above:  1. CAD - She is s/p CABG in 11/2016 with LIMA-LAD, SVG-LCx and SVG-D1. Most recently underwent orbital atherectomy and stent placement to the RCA in 05/2020. - She has NYHA Class II dyspnea but reports improvement in this and denies any recent chest pain.  - Continue ASA 81mg  daily, Plavix 75mg  daily, Toprol-XL 50mg  daily and Atorvastatin 40mg  daily. Will verify with Dr. Angelena Form how long to continue Plavix. Given no recent chest pain and her intermittent dizziness, will stop Imdur 15mg  daily at this time.   Addendum: 10/17/2020: Reviewed with Dr. Angelena Form and she can stop Plavix on 02/11/2021 given that her stent was placed in the setting of stable CAD without acute coronary syndrome.  I did call the patient and reviewed this with her as well and she will plan to stop Plavix at that time.  2. HFpEF - She denies any recent orthopnea, PND or edema and appears euvolemic by examination today. Continue Torsemide 40mg  daily. We reviewed that if she continues to have positional dizziness, this may need to be reduced.   3. Severe AS - She is  s/p TAVR on 08/12/2020 and recent echocardiogram showed mild AI with a mean gradient of 11 mmHg.   4. History of Lung Cancer/COPD - She is s/p right upper lobectomy in 2020. Reports significant improvement in her dyspnea and uses Stiolto daily and Albuterol as needed.    Medication Adjustments/Labs and Tests Ordered: Current medicines are reviewed at length with the patient today.  Concerns regarding medicines are outlined above.  Medication changes, Labs and Tests ordered today are listed in the Patient  Instructions below. Patient Instructions  Medication Instructions:  STOP Imdur    *If you need a refill on your cardiac medications before your next appointment, please call your pharmacy*   Lab Work: None today  If you have labs (blood work) drawn today and your tests are completely normal, you will receive your results only by: [facility] (if you have MyChart) OR A paper copy in the mail If you have any lab test that is abnormal or we need to change your treatment, we will call you to review the results.   Testing/Procedures: None today    Follow-Up: At [facility] you and your health needs are our priority.  As part of our continuing mission to provide you with exceptional heart care, we have created designated Provider Care Teams.  These Care Teams include your primary Cardiologist (physician) and Advanced Practice Providers (APPs -  Physician Assistants and Nurse Practitioners) who all work together to provide you with the care you need, when you need it.  We recommend signing up for the patient portal called "MyChart".  Sign up information is provided on this After Visit Summary.  MyChart is used to connect with patients for Virtual Visits (Telemedicine).  Patients are able to view lab/test results, encounter notes, upcoming appointments, etc.  Non-urgent messages can be sent to your provider as well.   To learn more about what you can do with MyChart, go to [email or web address].    Your next appointment:   3-4 month(s)  The format for your next appointment:   In Person  Provider:   Carlyle Dolly, MD or Bernerd Pho, PA-C   Other Instructions None      Signed, Erma Heritage, Hershal Coria  10/16/2020 7:31 PM    [facility] [address] [place], [place] [address] Phone: [phone] Fax: [phone]

## 2020-10-17 DIAGNOSIS — F419 Anxiety disorder, unspecified: Secondary | ICD-10-CM | POA: Diagnosis not present

## 2020-10-17 DIAGNOSIS — J441 Chronic obstructive pulmonary disease with (acute) exacerbation: Secondary | ICD-10-CM | POA: Diagnosis not present

## 2020-10-17 DIAGNOSIS — F1320 Sedative, hypnotic or anxiolytic dependence, uncomplicated: Secondary | ICD-10-CM | POA: Diagnosis not present

## 2020-10-17 DIAGNOSIS — I779 Disorder of arteries and arterioles, unspecified: Secondary | ICD-10-CM | POA: Diagnosis not present

## 2020-10-17 DIAGNOSIS — Z299 Encounter for prophylactic measures, unspecified: Secondary | ICD-10-CM | POA: Diagnosis not present

## 2020-10-28 ENCOUNTER — Encounter (HOSPITAL_COMMUNITY)
Admission: RE | Admit: 2020-10-28 | Discharge: 2020-10-28 | Disposition: A | Discharge status: Home / Self Care | Payer: Medicare HMO | Source: Ambulatory Visit | Attending: Cardiology | Admitting: Cardiology

## 2020-10-28 ENCOUNTER — Encounter (HOSPITAL_COMMUNITY): Payer: Self-pay

## 2020-10-28 ENCOUNTER — Other Ambulatory Visit: Payer: Self-pay

## 2020-10-28 VITALS — BP 110/60 | HR 80 | Ht 64.0 in | Wt 164.5 lb

## 2020-10-28 DIAGNOSIS — J449 Chronic obstructive pulmonary disease, unspecified: Secondary | ICD-10-CM | POA: Diagnosis not present

## 2020-10-28 DIAGNOSIS — Z952 Presence of prosthetic heart valve: Secondary | ICD-10-CM | POA: Diagnosis not present

## 2020-10-28 NOTE — Progress Notes (Signed)
Cardiac/Pulmonary Rehab Medication Review by a Pharmacist  Does the patient  feel that his/her medications are working for him/her?  yes  Has the patient been experiencing any side effects to the medications prescribed?  no  Does the patient measure his/her own blood pressure or blood glucose at home?  yes   Does the patient have any problems obtaining medications due to transportation or finances?   no  Understanding of regimen: excellent Understanding of indications: excellent Potential of compliance: excellent  Questions asked to Determine Patient Understanding of Medication Regimen:  1. What is the name of the medication?  2. What is the medication used for?  3. When should it be taken?  4. How much should be taken?  5. How will you take it?  6. What side effects should you report?  Understanding Defined as: Excellent: All questions above are correct Good: Questions 1-4 are correct Fair: Questions 1-2 are correct  Poor: 1 or none of the above questions are correct   Pharmacist comments: Patient has great understanding of medications and regimen.Recently seen by cardiology who updated med list and stopped Imdur.   Emily Rocha 10/28/2020 1:00 PM

## 2020-10-28 NOTE — Progress Notes (Signed)
Cardiac Individual Treatment Plan  Patient Details  Name: Emily Rocha MRN: [id number] Date of Birth: 1955-10-19 Referring Provider:   Flowsheet Row CARDIAC REHAB PHASE II ORIENTATION from 10/28/2020 in [facility]  Referring Provider Dr. Angelena Form       Initial Encounter Date:  Flowsheet Row CARDIAC REHAB PHASE II ORIENTATION from 10/28/2020 in [facility]  Date 10/28/20       Visit Diagnosis: S/P TAVR (transcatheter aortic valve replacement)  Patient's Home Medications on Admission:  Current Outpatient Medications:    albuterol (PROVENTIL) (2.5 MG/3ML) 0.083% nebulizer solution, Take 2.5 mg by nebulization every 4 (four) hours as needed for wheezing or shortness of breath. (Patient not taking: Reported on 10/16/2020), Disp: , Rfl:    albuterol (VENTOLIN HFA) 108 (90 Base) MCG/ACT inhaler, Inhale 1-2 puffs into the lungs every 6 (six) hours as needed for wheezing or shortness of breath., Disp: , Rfl:    ALPRAZolam (XANAX) 0.5 MG tablet, Take 0.5 mg by mouth 3 (three) times daily., Disp: , Rfl:    Ascorbic Acid (VITAMIN C GUMMIE PO), Chew 500 mg by mouth daily., Disp: , Rfl:    aspirin EC 81 MG tablet, Take 1 tablet (81 mg total) by mouth daily. Swallow whole., Disp: 90 tablet, Rfl: 3   atorvastatin (LIPITOR) 40 MG tablet, Take 1 tablet (40 mg total) daily by mouth., Disp: 30 tablet, Rfl: 1   cholecalciferol (VITAMIN D3) 25 MCG (1000 UNIT) tablet, Take 1,000 Units by mouth in the morning., Disp: , Rfl:    clopidogrel (PLAVIX) 75 MG tablet, Take 1 tablet (75 mg total) by mouth daily., Disp: 90 tablet, Rfl: 1   diphenhydramine-acetaminophen (TYLENOL PM) 25-500 MG TABS tablet, Take 2 tablets by mouth at bedtime., Disp: , Rfl:    metoprolol succinate (TOPROL-XL) 50 MG 24 hr tablet, Take 50 mg by mouth daily. Take with or immediately following a meal., Disp: , Rfl:    Multiple Vitamin (MULTIVITAMIN WITH MINERALS) TABS tablet, Take 1 tablet by mouth  daily., Disp:  , Rfl:    pantoprazole (PROTONIX) 40 MG tablet, Take 40 mg by mouth in the morning., Disp: , Rfl:    potassium chloride SA (KLOR-CON) 20 MEQ tablet, Take 20 mEq by mouth in the morning., Disp: , Rfl:    Tiotropium Bromide-Olodaterol (STIOLTO RESPIMAT) 2.5-2.5 MCG/ACT AERS, Inhale 2 puffs into the lungs daily., Disp: 1 each, Rfl: 4   torsemide (DEMADEX) 20 MG tablet, Take 40 mg by mouth See admin instructions. Take 2 tablets (40 mg) by mouth scheduled on Mondays, Tuesdays, Wednesdays, Thursdays, Fridays & Saturdays. (Hold dose on Sundays), Disp: , Rfl:   Past Medical History: Past Medical History:  Diagnosis Date   Anxiety    Asthma    COPD (chronic obstructive pulmonary disease) ([place])    Coronary artery disease    a. s/p NSTEMI in 11/2016 and required CABG with LIMA-LAD, SVG-LCx and SVG-D1 and complicated by cardiogenic shock b. cath in 05/2020 showing severe RCA stenosis and treated with orbital atherectomy and stent placement   GERD (gastroesophageal reflux disease)    History of CVA (cerebrovascular accident)    History of lung cancer 2020   Peripheral vascular disease ([place])    S/P TAVR (transcatheter aortic valve replacement) 08/12/2020   s/p TAVR with a 23 mm Edwards S3U via the TF approach by Dr. Angelena Form & Dr. Cyndia Bent   Severe aortic stenosis     Tobacco Use: Social History   Tobacco Use  Smoking  Status Every Day   Years: 40.00   Types: Cigarettes   Last attempt to quit: 10/24/2015   Years since quitting: 5.0  Smokeless Tobacco Never  Tobacco Comments   Patient started back smoking 4 months ago. She is currently smoking 10 cigarettes/day and is trying to quit.     Labs: Recent Review Flowsheet Data     Labs for [facility]P Cardiac and Pulmonary Rehab Latest Ref Rng & Units 06/04/2020 08/08/2020 08/12/2020 08/12/2020 08/12/2020   Cholestrol 0 - 200 mg/dL - - - - -   LDLCALC 0 - 99 mg/dL - - - - -   HDL >40 mg/dL - - - - -   Trlycerides <150 mg/dL - - - - -    Hemoglobin A1c 4.8 - 5.6 % - - - - -   PHART 7.350 - 7.450 - 7.418 7.175(LL) - -   PCO2ART 32.0 - 48.0 mmHg - 45.8 85.2(HH) - -   HCO3 20.0 - 28.0 mmol/L 32.2(H) 29.0(H) 31.5(H) - -   TCO2 22 - 32 mmol/L 34(H) - 34(H) 31 28   O2SAT % 59.0 95.3 98.0 - -       Capillary Blood Glucose: Lab Results  Component Value Date   GLUCAP 103 (H) 08/12/2020   GLUCAP 71 11/05/2019   GLUCAP 80 11/05/2019   GLUCAP 187 (H) 11/04/2019   GLUCAP 167 (H) 11/04/2019     Exercise Target Goals: Exercise Program Goal: Individual exercise prescription set using results from initial 6 min walk test and THRR while considering  patient's activity barriers and safety.   Exercise Prescription Goal: Starting with aerobic activity 30 plus minutes a day, 3 days per week for initial exercise prescription. Provide home exercise prescription and guidelines that participant acknowledges understanding prior to discharge.  Activity Barriers & Risk Stratification:  Activity Barriers & Cardiac Risk Stratification - 10/28/20 1234       Activity Barriers & Cardiac Risk Stratification   Activity Barriers Shortness of Breath    Cardiac Risk Stratification High             6 Minute Walk:  6 Minute Walk     Row Name 10/28/20 1411         6 Minute Walk   Phase Initial     Distance 1275 feet     Walk Time 6 minutes     # of Rest Breaks 0     MPH 2.41     METS 2.77     RPE 11     Perceived Dyspnea  13     VO2 Peak 9.7     Symptoms No     Resting HR 81 bpm     Resting BP 110/60     Resting Oxygen Saturation  93 %     Exercise Oxygen Saturation  during 6 min walk 86 %     Max Ex. HR 95 bpm     Max Ex. BP 116/72     2 Minute Post BP 100/68           Interval HR   Interval Heart Rate? Yes              Oxygen Initial Assessment:   Oxygen Re-Evaluation:   Oxygen Discharge (Final Oxygen Re-Evaluation):   Initial Exercise Prescription:  Initial Exercise Prescription - 10/28/20 1400        Date of Initial Exercise RX and Referring Provider   Date 10/28/20    Referring Provider Dr. Angelena Form  Expected Discharge Date 01/23/21      NuStep   Level 1    SPM 80    Minutes 39      Prescription Details   Frequency (times per week) 3    Duration Progress to 30 minutes of continuous aerobic without signs/symptoms of physical distress      Intensity   THRR 40-80% of Max Heartrate 61-122    Ratings of Perceived Exertion 11-13    Perceived Dyspnea 0-4      Resistance Training   Training Prescription Yes    Weight 2 lbs    Reps 10-15             Perform Capillary Blood Glucose checks as needed.  Exercise Prescription Changes:   Exercise Comments:   Exercise Goals and Review:   Exercise Goals     Row Name 10/28/20 1416             Exercise Goals   Increase Physical Activity Yes       Intervention Provide advice, education, support and counseling about physical activity/exercise needs.;Develop an individualized exercise prescription for aerobic and resistive training based on initial evaluation findings, risk stratification, comorbidities and participant's personal goals.       Expected Outcomes Short Term: Attend rehab on a regular basis to increase amount of physical activity.;Long Term: Add in home exercise to make exercise part of routine and to increase amount of physical activity.;Long Term: Exercising regularly at least 3-5 days a week.       Increase Strength and Stamina Yes       Intervention Provide advice, education, support and counseling about physical activity/exercise needs.;Develop an individualized exercise prescription for aerobic and resistive training based on initial evaluation findings, risk stratification, comorbidities and participant's personal goals.       Expected Outcomes Short Term: Increase workloads from initial exercise prescription for resistance, speed, and METs.;Short Term: Perform resistance training exercises routinely  during rehab and add in resistance training at home;Long Term: Improve cardiorespiratory fitness, muscular endurance and strength as measured by increased METs and functional capacity (6MWT)       Able to understand and use rate of perceived exertion (RPE) scale Yes       Intervention Provide education and explanation on how to use RPE scale       Expected Outcomes Short Term: Able to use RPE daily in rehab to express subjective intensity level;Long Term:  Able to use RPE to guide intensity level when exercising independently       Knowledge and understanding of Target Heart Rate Range (THRR) Yes       Intervention Provide education and explanation of THRR including how the numbers were predicted and where they are located for reference       Expected Outcomes Short Term: Able to state/look up THRR;Long Term: Able to use THRR to govern intensity when exercising independently;Short Term: Able to use daily as guideline for intensity in rehab       Able to check pulse independently Yes       Intervention Provide education and demonstration on how to check pulse in carotid and radial arteries.;Review the importance of being able to check your own pulse for safety during independent exercise       Expected Outcomes Long Term: Able to check pulse independently and accurately;Short Term: Able to explain why pulse checking is important during independent exercise       Understanding of Exercise Prescription Yes  Intervention Provide education, explanation, and written materials on patient's individual exercise prescription       Expected Outcomes Long Term: Able to explain home exercise prescription to exercise independently;Short Term: Able to explain program exercise prescription                Exercise Goals Re-Evaluation :    Discharge Exercise Prescription (Final Exercise Prescription Changes):   Nutrition:  Target Goals: Understanding of nutrition guidelines, daily intake of sodium  1500mg , cholesterol 200mg , calories 30% from fat and 7% or less from saturated fats, daily to have 5 or more servings of fruits and vegetables.  Biometrics:  Pre Biometrics - 10/28/20 1417       Pre Biometrics   Height 5\' 4"  (1.626 m)    Weight 74.6 kg    Waist Circumference 44 inches    Hip Circumference 43 inches    Waist to Hip Ratio 1.02 %    BMI (Calculated) 28.22    Triceps Skinfold 20 mm    % Body Fat 40.7 %    Grip Strength 18.9 kg    Flexibility 17.5 in    Single Leg Stand 4.32 seconds              Nutrition Therapy Plan and Nutrition Goals:  Nutrition Therapy & Goals - 10/28/20 1237       Personal Nutrition Goals   Comments Patient socred 3 on her diet assessment. She says she is trying to eat heart healthy. Handout provided and explained regarding healthier choices. We offer 2 educational sessions on heart heatlhy nutrition with handouts.      Intervention Plan   Intervention Nutrition handout(s) given to patient.             Nutrition Assessments:  Nutrition Assessments - 10/28/20 1236       MEDFICTS Scores   Pre Score 3            MEDIFICTS Score Key: ?70 Need to make dietary changes  40-70 Heart Healthy Diet ? 40 Therapeutic Level Cholesterol Diet   Picture Your Plate Scores: <81 Unhealthy dietary pattern with much room for improvement. 41-50 Dietary pattern unlikely to meet recommendations for good health and room for improvement. 51-60 More healthful dietary pattern, with some room for improvement.  >60 Healthy dietary pattern, although there may be some specific behaviors that could be improved.    Nutrition Goals Re-Evaluation:   Nutrition Goals Discharge (Final Nutrition Goals Re-Evaluation):   Psychosocial: Target Goals: Acknowledge presence or absence of significant depression and/or stress, maximize coping skills, provide positive support system. Participant is able to verbalize types and ability to use techniques and  skills needed for reducing stress and depression.  Initial Review & Psychosocial Screening:  Initial Psych Review & Screening - 10/28/20 1322       Initial Review   Current issues with Current Anxiety/Panic;Current Stress Concerns    Source of Stress Concerns Occupation;Chronic Illness      Family Dynamics   Good Support System? Yes      Barriers   Psychosocial barriers to participate in program There are no identifiable barriers or psychosocial needs.      Screening Interventions   Interventions Encouraged to exercise;Provide feedback about the scores to participant    Expected Outcomes Short Term goal: Identification and review with participant of any Quality of Life or Depression concerns found by scoring the questionnaire.;Long Term goal: The participant improves quality of Life and PHQ9 Scores as seen by post  scores and/or verbalization of changes             Quality of Life Scores:  Quality of Life - 10/28/20 1418       Quality of Life   Select Quality of Life      Quality of Life Scores   Health/Function Pre 27.86 %    Socioeconomic Pre 30 %    Psych/Spiritual Pre 30 %    Family Pre 30 %    GLOBAL Pre 29.09 %            Scores of 19 and below usually indicate a poorer quality of life in these areas.  A difference of  2-3 points is a clinically meaningful difference.  A difference of 2-3 points in the total score of the Quality of Life Index has been associated with significant improvement in overall quality of life, self-image, physical symptoms, and general health in studies assessing change in quality of life.  PHQ-9: Recent Review Flowsheet Data     Depression screen [facility] 2/9 10/28/2020 10/01/2019 11/07/2018   Decreased Interest 0 0 0   Down, Depressed, Hopeless 0 0 0   PHQ - 2 Score 0 0 0   Altered sleeping 0 0 -   Tired, decreased energy 0 0 -   Change in appetite 0 0 -   Feeling bad or failure about yourself  0 0 -   Trouble concentrating 0 0 -    Moving slowly or fidgety/restless 0 0 -   Suicidal thoughts 0 0 -   PHQ-9 Score 0 0 -   Difficult doing work/chores Not difficult at all Not difficult at all -      Interpretation of Total Score  Total Score Depression Severity:  1-4 = Minimal depression, 5-9 = Mild depression, 10-14 = Moderate depression, 15-19 = Moderately severe depression, 20-27 = Severe depression   Psychosocial Evaluation and Intervention:  Psychosocial Evaluation - 10/28/20 1322       Psychosocial Evaluation & Interventions   Interventions Stress management education;Relaxation education;Encouraged to exercise with the program and follow exercise prescription    Comments Patient has no psychosocial barriers to participate in CR identfied at her orientation visit. Her initial PHQ-9 score was 0 and her QOL was 29.09 overall. She does have anxiety which is managed well with Alprazolam 0.5 mg TID. She denies any depression. She says her support person is her husband. She states he has been very supportative of her over the past year with her multiple health issues. She did start back smoking 4 months ago which she states she is very disappointed in herself but is determined to quit. She says she started due to having increased stress on her job and hanging around with friends that smoke.  She continues to work as Psychologist, counselling and enjoys working very much. She is looking forward to the program and gaining her strength and stamina back.    Expected Outcomes Patient will continue to have no psychosocial barriers identified.    Continue Psychosocial Services  No Follow up required             Psychosocial Re-Evaluation:   Psychosocial Discharge (Final Psychosocial Re-Evaluation):   Vocational Rehabilitation: Provide vocational rehab assistance to qualifying candidates.   Vocational Rehab Evaluation & Intervention:  Vocational Rehab - 10/28/20 1236       Initial Vocational Rehab Evaluation & Intervention    Assessment shows need for Vocational Rehabilitation No  Vocational Rehab Re-Evaulation   Comments Patient is currently working and does not need vocational rehab.             Education: Education Goals: Education classes will be provided on a weekly basis, covering required topics. Participant will state understanding/return demonstration of topics presented.  Learning Barriers/Preferences:  Learning Barriers/Preferences - 10/28/20 1319       Learning Barriers/Preferences   Learning Barriers None    Learning Preferences Written Material             Education Topics: Hypertension, Hypertension Reduction -Define heart disease and high blood pressure. Discus how high blood pressure affects the body and ways to reduce high blood pressure.   Exercise and Your Heart -Discuss why it is important to exercise, the FITT principles of exercise, normal and abnormal responses to exercise, and how to exercise safely.   Angina -Discuss definition of angina, causes of angina, treatment of angina, and how to decrease risk of having angina.   Cardiac Medications -Review what the following cardiac medications are used for, how they affect the body, and side effects that may occur when taking the medications.  Medications include Aspirin, Beta blockers, calcium channel blockers, ACE Inhibitors, angiotensin receptor blockers, diuretics, digoxin, and antihyperlipidemics.   Congestive Heart Failure -Discuss the definition of CHF, how to live with CHF, the signs and symptoms of CHF, and how keep track of weight and sodium intake.   Heart Disease and Intimacy -Discus the effect sexual activity has on the heart, how changes occur during intimacy as we age, and safety during sexual activity.   Smoking Cessation / COPD -Discuss different methods to quit smoking, the health benefits of quitting smoking, and the definition of COPD. Flowsheet Row CARDIAC REHAB PHASE II EXERCISE from  02/23/2017 in [place]  Date 02/02/17  Educator DC  Instruction Review Code 2- Demonstrated Understanding       Nutrition I: Fats -Discuss the types of cholesterol, what cholesterol does to the heart, and how cholesterol levels can be controlled. Flowsheet Row CARDIAC REHAB PHASE II EXERCISE from 02/23/2017 in [place]  Date 02/09/17  Educator [place]  Instruction Review Code 2- Demonstrated Understanding       Nutrition II: Labels -Discuss the different components of food labels and how to read food label [place] from 02/23/2017 in [place]  Date 02/16/17  Educator DC  Instruction Review Code 2- Demonstrated Understanding       Heart Parts/Heart Disease and PAD -Discuss the anatomy of the heart, the pathway of blood circulation through the heart, and these are affected by heart disease. Flowsheet Row CARDIAC REHAB PHASE II EXERCISE from 02/23/2017 in [place]  Date 02/23/17  Educator [place]  Instruction Review Code 2- Demonstrated Understanding       Stress I: Signs and Symptoms -Discuss the causes of stress, how stress may lead to anxiety and depression, and ways to limit stress.   Stress II: Relaxation -Discuss different types of relaxation techniques to limit stress.   Warning Signs of Stroke / TIA -Discuss definition of a stroke, what the signs and symptoms are of a stroke, and how to identify when someone is having stroke.   Knowledge Questionnaire Score:  Knowledge Questionnaire Score - 10/28/20 1319       Knowledge Questionnaire Score   Pre Score 21/24             Core Components/Risk  Factors/Patient Goals at Admission:  Personal Goals and Risk Factors at Admission - 10/28/20 1320       Core Components/Risk Factors/Patient Goals on Admission    Weight Management Obesity    Tobacco Cessation Yes    Number of packs per day  10    Intervention Offer self-teaching materials, assist with locating and accessing local/national Quit Smoking programs, and support quit date choice.    Expected Outcomes Short Term: Will quit all tobacco product use, adhering to prevention of relapse plan.    Personal Goal Other Yes    Personal Goal Increase strenght and stamina. Get back into exercising. Be able to return to her ADL's. Stop smoking.    Intervention Patient will attend CR 3 days/week and supplement with exercise at home 2 days/week.    Expected Outcomes Patient will meet both personal and program goals.             Core Components/Risk Factors/Patient Goals Review:    Core Components/Risk Factors/Patient Goals at Discharge (Final Review):    ITP Comments:   Comments: Patient arrived for 1st visit/orientation/education at 1230. Patient was referred to CR by Dr. Lauree Chandler due to S/P TAVR (Z95.2). During orientation advised patient on arrival and appointment times what to wear, what to do before, during and after exercise. Reviewed attendance and class policy.  Pt is scheduled to return Cardiac Rehab on 11/03/20 at 1100. Pt was advised to come to class 15 minutes before class starts.  Discussed RPE/Dpysnea scales. Patient participated in warm up stretches. Patient was able to complete 6 minute walk test.  Telemetry:NRR. Patient was measured for the equipment. Discussed equipment safety with patient. Took patient pre-anthropometric measurements. Patient finished visit at 1338.

## 2020-10-30 ENCOUNTER — Encounter (HOSPITAL_COMMUNITY): Payer: Medicare HMO

## 2020-11-03 ENCOUNTER — Other Ambulatory Visit: Payer: Self-pay

## 2020-11-03 ENCOUNTER — Encounter (HOSPITAL_COMMUNITY)
Admission: RE | Admit: 2020-11-03 | Discharge: 2020-11-03 | Disposition: A | Discharge status: Home / Self Care | Payer: Medicare HMO | Source: Ambulatory Visit | Attending: Cardiology | Admitting: Cardiology

## 2020-11-03 DIAGNOSIS — Z952 Presence of prosthetic heart valve: Secondary | ICD-10-CM

## 2020-11-03 DIAGNOSIS — J449 Chronic obstructive pulmonary disease, unspecified: Secondary | ICD-10-CM

## 2020-11-03 NOTE — Progress Notes (Signed)
Daily Session Note  Patient Details  Name: Emily Rocha MRN: [id number] Date of Birth: 02/25/1955 Referring Provider:   Flowsheet Row CARDIAC REHAB PHASE II ORIENTATION from 10/28/2020 in [facility]  Referring Provider Dr. Angelena Form       Encounter Date: 11/03/2020  Check In:  Session Check In - 11/03/20 1100       Check-In   Supervising physician immediately available to respond to emergencies [facility] MD immediately available    Physician(s) Dr. Harl Bowie    Location [facility]    Staff Present Hoy Register, MS, ACSM-CEP, Exercise Physiologist;Debra Wynetta Emery, RN, BSN;Heather Otho Ket, BS, Exercise Physiologist    Virtual Visit No    Medication changes reported     No    Fall or balance concerns reported    No    Tobacco Cessation No Change    Current number of cigarettes/nicotine per day     10    Warm-up and Cool-down Performed as group-led instruction    Resistance Training Performed Yes    VAD Patient? No    PAD/SET Patient? No      Pain Assessment   Currently in Pain? No/denies    Multiple Pain Sites No             Capillary Blood Glucose: No results found for this or any previous visit (from the past 24 hour(s)).    Social History   Tobacco Use  Smoking Status Every Day   Years: 40.00   Types: Cigarettes   Last attempt to quit: 10/24/2015   Years since quitting: 5.0  Smokeless Tobacco Never  Tobacco Comments   Patient started back smoking 4 months ago. She is currently smoking 10 cigarettes/day and is trying to quit.     Goals Met:  Independence with exercise equipment Exercise tolerated well No report of concerns or symptoms today Strength training completed today  Goals Unmet:  Not Applicable  Comments: checkout time is 1200   Dr. Kathie Dike is Medical Director for [facility].

## 2020-11-05 ENCOUNTER — Encounter (HOSPITAL_COMMUNITY)
Admission: RE | Admit: 2020-11-05 | Discharge: 2020-11-05 | Disposition: A | Discharge status: Home / Self Care | Payer: Medicare HMO | Source: Ambulatory Visit | Attending: Cardiology | Admitting: Cardiology

## 2020-11-05 ENCOUNTER — Other Ambulatory Visit: Payer: Self-pay

## 2020-11-05 DIAGNOSIS — J449 Chronic obstructive pulmonary disease, unspecified: Secondary | ICD-10-CM

## 2020-11-05 DIAGNOSIS — Z952 Presence of prosthetic heart valve: Secondary | ICD-10-CM | POA: Diagnosis not present

## 2020-11-05 NOTE — Progress Notes (Signed)
Daily Session Note  Patient Details  Name: Emily Rocha MRN: [id number] Date of Birth: Oct 05, 1955 Referring Provider:   Flowsheet Row CARDIAC REHAB PHASE II ORIENTATION from 10/28/2020 in [place]  Referring Provider Dr. Angelena Form       Encounter Date: 11/05/2020  Check In:  Session Check In - 11/05/20 1100       Check-In   Supervising physician immediately available to respond to emergencies [facility] MD immediately available    Physician(s) Dr. Harl Bowie    Location [facility]    Staff Present Redge Gainer, BS, Exercise Physiologist;Debra Wynetta Emery, RN, Bjorn Loser, MS, ACSM-CEP, Exercise Physiologist    Virtual Visit No    Medication changes reported     No    Fall or balance concerns reported    No    Tobacco Cessation No Change    Warm-up and Cool-down Performed as group-led instruction    Resistance Training Performed Yes    VAD Patient? No    PAD/SET Patient? No      Pain Assessment   Currently in Pain? No/denies    Multiple Pain Sites No             Capillary Blood Glucose: No results found for this or any previous visit (from the past 24 hour(s)).    Social History   Tobacco Use  Smoking Status Every Day   Years: 40.00   Types: Cigarettes   Last attempt to quit: 10/24/2015   Years since quitting: 5.0  Smokeless Tobacco Never  Tobacco Comments   Patient started back smoking 4 months ago. She is currently smoking 10 cigarettes/day and is trying to quit.     Goals Met:  Independence with exercise equipment Exercise tolerated well No report of concerns or symptoms today Strength training completed today  Goals Unmet:  Not Applicable  Comments: check out 1200   Dr. Kathie Dike is Medical Director for [facility] Pulmonary Rehab.

## 2020-11-07 ENCOUNTER — Encounter (HOSPITAL_COMMUNITY)
Admission: RE | Admit: 2020-11-07 | Discharge: 2020-11-07 | Disposition: A | Discharge status: Home / Self Care | Payer: Medicare HMO | Source: Ambulatory Visit | Attending: Cardiology | Admitting: Cardiology

## 2020-11-07 ENCOUNTER — Other Ambulatory Visit: Payer: Self-pay

## 2020-11-07 DIAGNOSIS — J449 Chronic obstructive pulmonary disease, unspecified: Secondary | ICD-10-CM

## 2020-11-07 DIAGNOSIS — Z952 Presence of prosthetic heart valve: Secondary | ICD-10-CM

## 2020-11-07 NOTE — Progress Notes (Signed)
I have reviewed a Home Exercise Prescription with [name] . Emily Rocha is not currently exercising at home.  The patient was advised to walk 2 days a week for 30-45 minutes.  [name] and I discussed how to progress their exercise prescription.  The patient stated that their goals were get back walking long distances and not having to rest while shopping.  The patient stated that they understand the exercise prescription.  We reviewed exercise guidelines, target heart rate during exercise, RPE Scale, weather conditions, NTG use, endpoints for exercise, warmup and cool down.  Patient is encouraged to come to me with any questions. I will continue to follow up with the patient to assist them with progression and safety.

## 2020-11-07 NOTE — Progress Notes (Signed)
Daily Session Note  Patient Details  Name: Emily Rocha MRN: [id number] Date of Birth: 10-Sep-1955 Referring Provider:   Flowsheet Row CARDIAC REHAB PHASE II ORIENTATION from 10/28/2020 in [place]  Referring Provider Dr. Angelena Form       Encounter Date: 11/07/2020  Check In:  Session Check In - 11/07/20 1100       Check-In   Supervising physician immediately available to respond to emergencies [facility] MD immediately available    Physician(s) Dr. Harl Bowie    Location [facility]    Staff Present Hoy Register, MS, ACSM-CEP, Exercise Physiologist;Roney Youtz Zigmund Daniel, Exercise Physiologist;Debra Wynetta Emery, RN, BSN    Virtual Visit No    Medication changes reported     No    Fall or balance concerns reported    No    Tobacco Cessation No Change    Warm-up and Cool-down Performed as group-led instruction    Resistance Training Performed Yes    VAD Patient? No    PAD/SET Patient? No      Pain Assessment   Currently in Pain? No/denies    Multiple Pain Sites No             Capillary Blood Glucose: No results found for this or any previous visit (from the past 24 hour(s)).    Social History   Tobacco Use  Smoking Status Every Day   Years: 40.00   Types: Cigarettes   Last attempt to quit: 10/24/2015   Years since quitting: 5.0  Smokeless Tobacco Never  Tobacco Comments   Patient started back smoking 4 months ago. She is currently smoking 10 cigarettes/day and is trying to quit.     Goals Met:  Independence with exercise equipment Exercise tolerated well No report of concerns or symptoms today Strength training completed today  Goals Unmet:  Not Applicable  Comments: check out 1200   Dr. Kathie Dike is Medical Director for [facility].

## 2020-11-10 ENCOUNTER — Other Ambulatory Visit: Payer: Self-pay

## 2020-11-10 ENCOUNTER — Encounter (HOSPITAL_COMMUNITY)
Admission: RE | Admit: 2020-11-10 | Discharge: 2020-11-10 | Disposition: A | Discharge status: Home / Self Care | Payer: Medicare HMO | Source: Ambulatory Visit | Attending: Cardiology | Admitting: Cardiology

## 2020-11-10 VITALS — Wt 164.0 lb

## 2020-11-10 DIAGNOSIS — J449 Chronic obstructive pulmonary disease, unspecified: Secondary | ICD-10-CM

## 2020-11-10 DIAGNOSIS — Z952 Presence of prosthetic heart valve: Secondary | ICD-10-CM | POA: Diagnosis not present

## 2020-11-10 NOTE — Progress Notes (Signed)
Daily Session Note  Patient Details  Name: Emily Rocha MRN: [id number] Date of Birth: 02-19-1955 Referring Provider:   Flowsheet Row CARDIAC REHAB PHASE II ORIENTATION from 10/28/2020 in [place]  Referring Provider Dr. Angelena Form       Encounter Date: 11/10/2020  Check In:  Session Check In - 11/10/20 1100       Check-In   Supervising physician immediately available to respond to emergencies [facility] MD immediately available    Physician(s) Dr.Nishan    Location [facility]    Staff Present Maurice Small, RN, Bjorn Loser, MS, ACSM-CEP, Exercise Physiologist;Graham Hyun Otho Ket, BS, Exercise Physiologist    Medication changes reported     No    Fall or balance concerns reported    No    Tobacco Cessation No Change    Warm-up and Cool-down Performed as group-led instruction    Resistance Training Performed Yes    VAD Patient? No    PAD/SET Patient? No      Pain Assessment   Currently in Pain? No/denies    Multiple Pain Sites No             Capillary Blood Glucose: No results found for this or any previous visit (from the past 24 hour(s)).    Social History   Tobacco Use  Smoking Status Every Day   Years: 40.00   Types: Cigarettes   Last attempt to quit: 10/24/2015   Years since quitting: 5.0  Smokeless Tobacco Never  Tobacco Comments   Patient started back smoking 4 months ago. She is currently smoking 10 cigarettes/day and is trying to quit.     Goals Met:  Independence with exercise equipment Exercise tolerated well No report of concerns or symptoms today Strength training completed today  Goals Unmet:  Not Applicable  Comments: check out 1200   Dr. Kathie Dike is Medical Director for [facility].

## 2020-11-12 ENCOUNTER — Other Ambulatory Visit: Payer: Self-pay

## 2020-11-12 ENCOUNTER — Encounter (HOSPITAL_COMMUNITY)
Admission: RE | Admit: 2020-11-12 | Discharge: 2020-11-12 | Disposition: A | Discharge status: Home / Self Care | Payer: Medicare HMO | Source: Ambulatory Visit | Attending: Cardiology | Admitting: Cardiology

## 2020-11-12 DIAGNOSIS — Z952 Presence of prosthetic heart valve: Secondary | ICD-10-CM | POA: Diagnosis not present

## 2020-11-12 DIAGNOSIS — J449 Chronic obstructive pulmonary disease, unspecified: Secondary | ICD-10-CM | POA: Diagnosis not present

## 2020-11-12 NOTE — Progress Notes (Signed)
Daily Session Note  Patient Details  Name: Emily Rocha MRN: [id number] Date of Birth: 1955/04/01 Referring Provider:   Flowsheet Row CARDIAC REHAB PHASE II ORIENTATION from 10/28/2020 in [place]  Referring Provider Dr. Angelena Form       Encounter Date: 11/12/2020  Check In:  Session Check In - 11/12/20 1059       Check-In   Supervising physician immediately available to respond to emergencies [facility] MD immediately available    Physician(s) Dr. Domenic Polite    Location [facility]    Staff Present Hoy Register, MS, ACSM-CEP, Exercise Physiologist;Debra Wynetta Emery, RN, BSN;Heather Otho Ket, BS, Exercise Physiologist    Virtual Visit No    Medication changes reported     No    Fall or balance concerns reported    No    Tobacco Cessation No Change    Current number of cigarettes/nicotine per day     10    Warm-up and Cool-down Performed as group-led instruction    Resistance Training Performed Yes    VAD Patient? No    PAD/SET Patient? No      Pain Assessment   Currently in Pain? No/denies    Multiple Pain Sites No             Capillary Blood Glucose: No results found for this or any previous visit (from the past 24 hour(s)).    Social History   Tobacco Use  Smoking Status Every Day   Years: 40.00   Types: Cigarettes   Last attempt to quit: 10/24/2015   Years since quitting: 5.0  Smokeless Tobacco Never  Tobacco Comments   Patient started back smoking 4 months ago. She is currently smoking 10 cigarettes/day and is trying to quit.     Goals Met:  Independence with exercise equipment Exercise tolerated well No report of concerns or symptoms today Strength training completed today  Goals Unmet:  Not Applicable  Comments: checkout time is 1200   Dr. Kathie Dike is Medical Director for [facility].

## 2020-11-14 ENCOUNTER — Encounter (HOSPITAL_COMMUNITY): Payer: Medicare HMO

## 2020-11-17 ENCOUNTER — Encounter (HOSPITAL_COMMUNITY)
Admission: RE | Admit: 2020-11-17 | Discharge: 2020-11-17 | Disposition: A | Discharge status: Home / Self Care | Payer: Medicare HMO | Source: Ambulatory Visit | Attending: Cardiology | Admitting: Cardiology

## 2020-11-17 ENCOUNTER — Other Ambulatory Visit: Payer: Self-pay

## 2020-11-17 DIAGNOSIS — J449 Chronic obstructive pulmonary disease, unspecified: Secondary | ICD-10-CM | POA: Insufficient documentation

## 2020-11-17 DIAGNOSIS — Z952 Presence of prosthetic heart valve: Secondary | ICD-10-CM | POA: Insufficient documentation

## 2020-11-17 NOTE — Progress Notes (Signed)
Daily Session Note  Patient Details  Name: Emily Rocha MRN: [id number] Date of Birth: 03/12/1955 Referring Provider:   Flowsheet Row CARDIAC REHAB PHASE II ORIENTATION from 10/28/2020 in [place]  Referring Provider Dr. Angelena Form       Encounter Date: 11/17/2020  Check In:  Session Check In - 11/17/20 1100       Check-In   Supervising physician immediately available to respond to emergencies [facility] immediately available    Physician(s) Dr. Domenic Polite    Location [facility]    Staff Present Hoy Register, MS, ACSM-CEP, Exercise Physiologist;Debra Wynetta Emery, RN, BSN    Virtual Visit No    Medication changes reported     No    Fall or balance concerns reported    No    Tobacco Cessation No Change    Warm-up and Cool-down Performed as group-led instruction    Resistance Training Performed Yes    VAD Patient? No    PAD/SET Patient? No      Pain Assessment   Currently in Pain? No/denies    Multiple Pain Sites No             Capillary Blood Glucose: No results found for this or any previous visit (from the past 24 hour(s)).    Social History   Tobacco Use  Smoking Status Every Day   Years: 40.00   Types: Cigarettes   Last attempt to quit: 10/24/2015   Years since quitting: 5.0  Smokeless Tobacco Never  Tobacco Comments   Patient started back smoking 4 months ago. She is currently smoking 10 cigarettes/day and is trying to quit.     Goals Met:  Independence with exercise equipment Exercise tolerated well No report of concerns or symptoms today Strength training completed today  Goals Unmet:  Not Applicable  Comments: checkout time is 1200   Dr. Kathie Dike is Medical Director for [facility].

## 2020-11-19 ENCOUNTER — Encounter (HOSPITAL_COMMUNITY)
Admission: RE | Admit: 2020-11-19 | Discharge: 2020-11-19 | Disposition: A | Discharge status: Home / Self Care | Payer: Medicare HMO | Source: Ambulatory Visit | Attending: Cardiology | Admitting: Cardiology

## 2020-11-19 ENCOUNTER — Other Ambulatory Visit: Payer: Self-pay

## 2020-11-19 DIAGNOSIS — Z952 Presence of prosthetic heart valve: Secondary | ICD-10-CM | POA: Diagnosis not present

## 2020-11-19 DIAGNOSIS — J449 Chronic obstructive pulmonary disease, unspecified: Secondary | ICD-10-CM | POA: Diagnosis not present

## 2020-11-19 NOTE — Progress Notes (Signed)
Daily Session Note  Patient Details  Name: Emily Rocha MRN: [id number] Date of Birth: 1955-09-21 Referring Provider:   Flowsheet Row CARDIAC REHAB PHASE II ORIENTATION from 10/28/2020 in [place]  Referring Provider Dr. Angelena Form       Encounter Date: 11/19/2020  Check In:  Session Check In - 11/19/20 1100       Check-In   Supervising physician immediately available to respond to emergencies [facility] MD immediately available    Physician(s) Dr. Domenic Polite    Location [facility]    Staff Present Redge Gainer, BS, Exercise Physiologist;Debra Wynetta Emery, RN, BSN    Virtual Visit No    Medication changes reported     No    Fall or balance concerns reported    No    Tobacco Cessation No Change    Warm-up and Cool-down Performed as group-led instruction    Resistance Training Performed Yes    VAD Patient? No    PAD/SET Patient? No      Pain Assessment   Currently in Pain? No/denies    Multiple Pain Sites No             Capillary Blood Glucose: No results found for this or any previous visit (from the past 24 hour(s)).    Social History   Tobacco Use  Smoking Status Every Day   Years: 40.00   Types: Cigarettes   Last attempt to quit: 10/24/2015   Years since quitting: 5.0  Smokeless Tobacco Never  Tobacco Comments   Patient started back smoking 4 months ago. She is currently smoking 10 cigarettes/day and is trying to quit.     Goals Met:  Independence with exercise equipment Exercise tolerated well No report of concerns or symptoms today Strength training completed today  Goals Unmet:  Not Applicable  Comments: check out 1200   Dr. Kathie Dike is Medical Director for [facility].

## 2020-11-20 DIAGNOSIS — I700 Atherosclerosis of aorta: Secondary | ICD-10-CM | POA: Diagnosis not present

## 2020-11-20 DIAGNOSIS — F17210 Nicotine dependence, cigarettes, uncomplicated: Secondary | ICD-10-CM | POA: Diagnosis not present

## 2020-11-20 DIAGNOSIS — F1320 Sedative, hypnotic or anxiolytic dependence, uncomplicated: Secondary | ICD-10-CM | POA: Diagnosis not present

## 2020-11-20 DIAGNOSIS — Z299 Encounter for prophylactic measures, unspecified: Secondary | ICD-10-CM | POA: Diagnosis not present

## 2020-11-20 DIAGNOSIS — J439 Emphysema, unspecified: Secondary | ICD-10-CM | POA: Diagnosis not present

## 2020-11-20 DIAGNOSIS — I4891 Unspecified atrial fibrillation: Secondary | ICD-10-CM | POA: Diagnosis not present

## 2020-11-20 DIAGNOSIS — Z23 Encounter for immunization: Secondary | ICD-10-CM | POA: Diagnosis not present

## 2020-11-21 ENCOUNTER — Other Ambulatory Visit: Payer: Self-pay

## 2020-11-21 ENCOUNTER — Encounter (HOSPITAL_COMMUNITY)
Admission: RE | Admit: 2020-11-21 | Discharge: 2020-11-21 | Disposition: A | Discharge status: Home / Self Care | Payer: Medicare HMO | Source: Ambulatory Visit | Attending: Cardiology | Admitting: Cardiology

## 2020-11-21 DIAGNOSIS — J449 Chronic obstructive pulmonary disease, unspecified: Secondary | ICD-10-CM

## 2020-11-21 DIAGNOSIS — Z952 Presence of prosthetic heart valve: Secondary | ICD-10-CM | POA: Diagnosis not present

## 2020-11-21 NOTE — Progress Notes (Signed)
Daily Session Note  Patient Details  Name: Emily Rocha MRN: [id number] Date of Birth: 02/18/1955 Referring Provider:   Flowsheet Row CARDIAC REHAB PHASE II ORIENTATION from 10/28/2020 in [place]  Referring Provider Dr. Angelena Form       Encounter Date: 11/21/2020  Check In:  Session Check In - 11/21/20 1100       Check-In   Supervising physician immediately available to respond to emergencies [facility] MD immediately available    Physician(s) Dr. Harl Bowie    Location [facility]    Staff Present Redge Gainer, BS, Exercise Physiologist;Debra Wynetta Emery, RN, BSN    Virtual Visit No    Medication changes reported     No    Fall or balance concerns reported    No    Tobacco Cessation No Change    Warm-up and Cool-down Performed as group-led instruction    Resistance Training Performed Yes    VAD Patient? No    PAD/SET Patient? No      Pain Assessment   Currently in Pain? No/denies    Multiple Pain Sites No             Capillary Blood Glucose: No results found for this or any previous visit (from the past 24 hour(s)).    Social History   Tobacco Use  Smoking Status Every Day   Years: 40.00   Types: Cigarettes   Last attempt to quit: 10/24/2015   Years since quitting: 5.0  Smokeless Tobacco Never  Tobacco Comments   Patient started back smoking 4 months ago. She is currently smoking 10 cigarettes/day and is trying to quit.     Goals Met:  Independence with exercise equipment Exercise tolerated well No report of concerns or symptoms today Strength training completed today  Goals Unmet:  Not Applicable  Comments: check out 1200   Dr. Kathie Dike is Medical Director for [facility].

## 2020-11-24 ENCOUNTER — Encounter (HOSPITAL_COMMUNITY): Payer: Medicare HMO

## 2020-11-26 ENCOUNTER — Encounter (HOSPITAL_COMMUNITY): Payer: Medicare HMO

## 2020-11-26 NOTE — Progress Notes (Signed)
Cardiac Individual Treatment Plan  Patient Details  Name: Emily Rocha MRN: [id number] Date of Birth: May 28, 1955 Referring Provider:   Flowsheet Row CARDIAC REHAB PHASE II ORIENTATION from 10/28/2020 in [place]  Referring Provider Dr. Angelena Form       Initial Encounter Date:  Flowsheet Row CARDIAC REHAB PHASE II ORIENTATION from 10/28/2020 in [place]  Date 10/28/20       Visit Diagnosis: S/P TAVR (transcatheter aortic valve replacement)  Chronic obstructive pulmonary disease, unspecified COPD type ([place])  Patient's Home Medications on Admission:  Current Outpatient Medications:    albuterol (PROVENTIL) (2.5 MG/3ML) 0.083% nebulizer solution, Take 2.5 mg by nebulization every 4 (four) hours as needed for wheezing or shortness of breath. (Patient not taking: Reported on 10/16/2020), Disp: , Rfl:    albuterol (VENTOLIN HFA) 108 (90 Base) MCG/ACT inhaler, Inhale 1-2 puffs into the lungs every 6 (six) hours as needed for wheezing or shortness of breath., Disp: , Rfl:    ALPRAZolam (XANAX) 0.5 MG tablet, Take 0.5 mg by mouth 3 (three) times daily., Disp: , Rfl:    Ascorbic Acid (VITAMIN C GUMMIE PO), Chew 500 mg by mouth daily., Disp: , Rfl:    aspirin EC 81 MG tablet, Take 1 tablet (81 mg total) by mouth daily. Swallow whole., Disp: 90 tablet, Rfl: 3   atorvastatin (LIPITOR) 40 MG tablet, Take 1 tablet (40 mg total) daily by mouth., Disp: 30 tablet, Rfl: 1   cholecalciferol (VITAMIN D3) 25 MCG (1000 UNIT) tablet, Take 1,000 Units by mouth in the morning., Disp: , Rfl:    clopidogrel (PLAVIX) 75 MG tablet, Take 1 tablet (75 mg total) by mouth daily., Disp: 90 tablet, Rfl: 1   diphenhydramine-acetaminophen (TYLENOL PM) 25-500 MG TABS tablet, Take 2 tablets by mouth at bedtime., Disp: , Rfl:    metoprolol succinate (TOPROL-XL) 50 MG 24 hr tablet, Take 50 mg by mouth daily. Take with or immediately following a meal., Disp: , Rfl:    Multiple  Vitamin (MULTIVITAMIN WITH MINERALS) TABS tablet, Take 1 tablet by mouth daily., Disp:  , Rfl:    pantoprazole (PROTONIX) 40 MG tablet, Take 40 mg by mouth in the morning., Disp: , Rfl:    potassium chloride SA (KLOR-CON) 20 MEQ tablet, Take 20 mEq by mouth in the morning., Disp: , Rfl:    Tiotropium Bromide-Olodaterol (STIOLTO RESPIMAT) 2.5-2.5 MCG/ACT AERS, Inhale 2 puffs into the lungs daily., Disp: 1 each, Rfl: 4   torsemide (DEMADEX) 20 MG tablet, Take 40 mg by mouth See admin instructions. Take 2 tablets (40 mg) by mouth scheduled on Mondays, Tuesdays, Wednesdays, Thursdays, Fridays & Saturdays. (Hold dose on Sundays), Disp: , Rfl:   Past Medical History: Past Medical History:  Diagnosis Date   Anxiety    Asthma    COPD (chronic obstructive pulmonary disease) ([place])    Coronary artery disease    a. s/p NSTEMI in 11/2016 and required CABG with LIMA-LAD, SVG-LCx and SVG-D1 and complicated by cardiogenic shock b. cath in 05/2020 showing severe RCA stenosis and treated with orbital atherectomy and stent placement   GERD (gastroesophageal reflux disease)    History of CVA (cerebrovascular accident)    History of lung cancer 2020   Peripheral vascular disease ([place])    S/P TAVR (transcatheter aortic valve replacement) 08/12/2020   s/p TAVR with a 23 mm Edwards S3U via the TF approach by Dr. Angelena Form & Dr. Cyndia Bent   Severe aortic stenosis     Tobacco  Use: Social History   Tobacco Use  Smoking Status Every Day   Years: 40.00   Types: Cigarettes   Last attempt to quit: 10/24/2015   Years since quitting: 5.0  Smokeless Tobacco Never  Tobacco Comments   Patient started back smoking 4 months ago. She is currently smoking 10 cigarettes/day and is trying to quit.     Labs: Recent Review Flowsheet Data     Labs for ITP Cardiac and Pulmonary Rehab Latest Ref Rng & Units 06/04/2020 08/08/2020 08/12/2020 08/12/2020 08/12/2020   Cholestrol 0 - 200 mg/dL - - - - -   LDLCALC 0 - 99 mg/dL - - - - -    HDL >40 mg/dL - - - - -   Trlycerides <150 mg/dL - - - - -   Hemoglobin A1c 4.8 - 5.6 % - - - - -   PHART 7.350 - 7.450 - 7.418 7.175(LL) - -   PCO2ART 32.0 - 48.0 mmHg - 45.8 85.2(HH) - -   HCO3 20.0 - 28.0 mmol/L 32.2(H) 29.0(H) 31.5(H) - -   TCO2 22 - 32 mmol/L 34(H) - 34(H) 31 28   O2SAT % 59.0 95.3 98.0 - -       Capillary Blood Glucose: Lab Results  Component Value Date   GLUCAP 103 (H) 08/12/2020   GLUCAP 71 11/05/2019   GLUCAP 80 11/05/2019   GLUCAP 187 (H) 11/04/2019   GLUCAP 167 (H) 11/04/2019     Exercise Target Goals: Exercise Program Goal: Individual exercise prescription set using results from initial 6 min walk test and THRR while considering  patient's activity barriers and safety.   Exercise Prescription Goal: Starting with aerobic activity 30 plus minutes a day, 3 days per week for initial exercise prescription. Provide home exercise prescription and guidelines that participant acknowledges understanding prior to discharge.  Activity Barriers & Risk Stratification:  Activity Barriers & Cardiac Risk Stratification - 10/28/20 1234       Activity Barriers & Cardiac Risk Stratification   Activity Barriers Shortness of Breath    Cardiac Risk Stratification High             6 Minute Walk:  6 Minute Walk     Row Name 10/28/20 1411         6 Minute Walk   Phase Initial     Distance 1275 feet     Walk Time 6 minutes     # of Rest Breaks 0     MPH 2.41     METS 2.77     RPE 11     Perceived Dyspnea  13     VO2 Peak 9.7     Symptoms No     Resting HR 81 bpm     Resting BP 110/60     Resting Oxygen Saturation  93 %     Exercise Oxygen Saturation  during 6 min walk 86 %     Max Ex. HR 95 bpm     Max Ex. BP 116/72     2 Minute Post BP 100/68           Interval HR   Interval Heart Rate? Yes              Oxygen Initial Assessment:   Oxygen Re-Evaluation:   Oxygen Discharge (Final Oxygen Re-Evaluation):   Initial Exercise  Prescription:  Initial Exercise Prescription - 10/28/20 1400       Date of Initial Exercise RX and Referring Provider  Date 10/28/20    Referring Provider Dr. Angelena Form    Expected Discharge Date 01/23/21      NuStep   Level 1    SPM 80    Minutes 39      Prescription Details   Frequency (times per week) 3    Duration Progress to 30 minutes of continuous aerobic without signs/symptoms of physical distress      Intensity   THRR 40-80% of Max Heartrate 61-122    Ratings of Perceived Exertion 11-13    Perceived Dyspnea 0-4      Resistance Training   Training Prescription Yes    Weight 2 lbs    Reps 10-15             Perform Capillary Blood Glucose checks as needed.  Exercise Prescription Changes:   Exercise Prescription Changes     Row Name 11/07/20 1100 11/10/20 1200 11/21/20 1100         Response to Exercise   Blood Pressure (Admit) -- 122/76 115/60     Blood Pressure (Exercise) -- 132/64 125/60     Blood Pressure (Exit) -- 110/58 115/60     Heart Rate (Admit) -- 75 bpm 90 bpm     Heart Rate (Exercise) -- 94 bpm 108 bpm     Heart Rate (Exit) -- 83 bpm 99 bpm     Rating of Perceived Exertion (Exercise) -- 11 11     Duration -- Continue with 30 min of aerobic exercise without signs/symptoms of physical distress. Continue with 30 min of aerobic exercise without signs/symptoms of physical distress.     Intensity -- THRR unchanged THRR unchanged           Progression   Progression -- Continue to progress workloads to maintain intensity without signs/symptoms of physical distress. Continue to progress workloads to maintain intensity without signs/symptoms of physical distress.           Resistance Training   Training Prescription -- Yes Yes     Weight -- 2 2     Reps -- 10-15 10-15     Time -- 10 Minutes 10 Minutes           NuStep   Level -- 1 1     SPM -- 86 81     Minutes -- 39 39     METs -- -- 1.9           Home Exercise Plan   Plans to  continue exercise at Home (comment) -- --     Frequency Add 2 additional days to program exercise sessions. -- --     Initial Home Exercises Provided 11/07/20 -- --              Exercise Comments:   Exercise Comments     Row Name 11/07/20 1135           Exercise Comments home exercise reviewed                Exercise Goals and Review:   Exercise Goals     Row Name 10/28/20 1416 11/24/20 1345           Exercise Goals   Increase Physical Activity Yes Yes      Intervention Provide advice, education, support and counseling about physical activity/exercise needs.;Develop an individualized exercise prescription for aerobic and resistive training based on initial evaluation findings, risk stratification, comorbidities and participant's personal goals. Provide advice, education, support and counseling about physical activity/exercise  needs.;Develop an individualized exercise prescription for aerobic and resistive training based on initial evaluation findings, risk stratification, comorbidities and participant's personal goals.      Expected Outcomes Short Term: Attend rehab on a regular basis to increase amount of physical activity.;Long Term: Add in home exercise to make exercise part of routine and to increase amount of physical activity.;Long Term: Exercising regularly at least 3-5 days a week. Short Term: Attend rehab on a regular basis to increase amount of physical activity.;Long Term: Add in home exercise to make exercise part of routine and to increase amount of physical activity.;Long Term: Exercising regularly at least 3-5 days a week.      Increase Strength and Stamina Yes Yes      Intervention Provide advice, education, support and counseling about physical activity/exercise needs.;Develop an individualized exercise prescription for aerobic and resistive training based on initial evaluation findings, risk stratification, comorbidities and participant's personal goals.  Provide advice, education, support and counseling about physical activity/exercise needs.;Develop an individualized exercise prescription for aerobic and resistive training based on initial evaluation findings, risk stratification, comorbidities and participant's personal goals.      Expected Outcomes Short Term: Increase workloads from initial exercise prescription for resistance, speed, and METs.;Short Term: Perform resistance training exercises routinely during rehab and add in resistance training at home;Long Term: Improve cardiorespiratory fitness, muscular endurance and strength as measured by increased METs and functional capacity (6MWT) Short Term: Increase workloads from initial exercise prescription for resistance, speed, and METs.;Short Term: Perform resistance training exercises routinely during rehab and add in resistance training at home;Long Term: Improve cardiorespiratory fitness, muscular endurance and strength as measured by increased METs and functional capacity (6MWT)      Able to understand and use rate of perceived exertion (RPE) scale Yes Yes      Intervention Provide education and explanation on how to use RPE scale Provide education and explanation on how to use RPE scale      Expected Outcomes Short Term: Able to use RPE daily in rehab to express subjective intensity level;Long Term:  Able to use RPE to guide intensity level when exercising independently Short Term: Able to use RPE daily in rehab to express subjective intensity level;Long Term:  Able to use RPE to guide intensity level when exercising independently      Intervention -- --      Knowledge and understanding of Target Heart Rate Range (THRR) Yes Yes      Intervention Provide education and explanation of THRR including how the numbers were predicted and where they are located for reference Provide education and explanation of THRR including how the numbers were predicted and where they are located for reference       Expected Outcomes Short Term: Able to state/look up THRR;Long Term: Able to use THRR to govern intensity when exercising independently;Short Term: Able to use daily as guideline for intensity in rehab Short Term: Able to state/look up THRR;Long Term: Able to use THRR to govern intensity when exercising independently;Short Term: Able to use daily as guideline for intensity in rehab      Able to check pulse independently Yes Yes      Intervention Provide education and demonstration on how to check pulse in carotid and radial arteries.;Review the importance of being able to check your own pulse for safety during independent exercise Provide education and demonstration on how to check pulse in carotid and radial arteries.;Review the importance of being able to check your own pulse for safety  during independent exercise      Expected Outcomes Long Term: Able to check pulse independently and accurately;Short Term: Able to explain why pulse checking is important during independent exercise Long Term: Able to check pulse independently and accurately;Short Term: Able to explain why pulse checking is important during independent exercise      Understanding of Exercise Prescription Yes Yes      Intervention Provide education, explanation, and written materials on patient's individual exercise prescription Provide education, explanation, and written materials on patient's individual exercise prescription      Expected Outcomes Long Term: Able to explain home exercise prescription to exercise independently;Short Term: Able to explain program exercise prescription Short Term: Able to explain program exercise prescription               Exercise Goals Re-Evaluation :  Exercise Goals Re-Evaluation     [place] Name 11/24/20 1347             Exercise Goal Re-Evaluation   Exercise Goals Review Increase Physical Activity;Increase Strength and Stamina;Able to understand and use rate of perceived exertion (RPE)  scale;Knowledge and understanding of Target Heart Rate Range (THRR);Able to check pulse independently;Understanding of Exercise Prescription       Comments Pt has completed 9 sessions of cardiac rehab. She has complained of COPD while in class and feeling SOB. She is currently working at 1.90 METs on the stepper. Will monitor and progress as able.       Expected Outcomes Through exercise at rehab and at home, the patient will meet their stated goals.                 Discharge Exercise Prescription (Final Exercise Prescription Changes):  Exercise Prescription Changes - 11/21/20 1100       Response to Exercise   Blood Pressure (Admit) 115/60    Blood Pressure (Exercise) 125/60    Blood Pressure (Exit) 115/60    Heart Rate (Admit) 90 bpm    Heart Rate (Exercise) 108 bpm    Heart Rate (Exit) 99 bpm    Rating of Perceived Exertion (Exercise) 11    Duration Continue with 30 min of aerobic exercise without signs/symptoms of physical distress.    Intensity THRR unchanged      Progression   Progression Continue to progress workloads to maintain intensity without signs/symptoms of physical distress.      Resistance Training   Training Prescription Yes    Weight 2    Reps 10-15    Time 10 Minutes      NuStep   Level 1    SPM 81    Minutes 39    METs 1.9             Nutrition:  Target Goals: Understanding of nutrition guidelines, daily intake of sodium 1500mg , cholesterol 200mg , calories 30% from fat and 7% or less from saturated fats, daily to have 5 or more servings of fruits and vegetables.  Biometrics:  Pre Biometrics - 10/28/20 1417       Pre Biometrics   Height 5\' 4"  (1.626 m)    Weight 74.6 kg    Waist Circumference 44 inches    Hip Circumference 43 inches    Waist to Hip Ratio 1.02 %    BMI (Calculated) 28.22    Triceps Skinfold 20 mm    % Body Fat 40.7 %    Grip Strength 18.9 kg    Flexibility 17.5 in    Single Leg Stand 4.32  seconds               Nutrition Therapy Plan and Nutrition Goals:  Nutrition Therapy & Goals - 10/28/20 1237       Personal Nutrition Goals   Comments Patient socred 3 on her diet assessment. She says she is trying to eat heart healthy. Handout provided and explained regarding healthier choices. We offer 2 educational sessions on heart heatlhy nutrition with handouts.      Intervention Plan   Intervention Nutrition handout(s) given to patient.             Nutrition Assessments:  Nutrition Assessments - 10/28/20 1236       MEDFICTS Scores   Pre Score 3            MEDIFICTS Score Key: ?70 Need to make dietary changes  40-70 Heart Healthy Diet ? 40 Therapeutic Level Cholesterol Diet   Picture Your Plate Scores: <67 Unhealthy dietary pattern with much room for improvement. 41-50 Dietary pattern unlikely to meet recommendations for good health and room for improvement. 51-60 More healthful dietary pattern, with some room for improvement.  >60 Healthy dietary pattern, although there may be some specific behaviors that could be improved.    Nutrition Goals Re-Evaluation:   Nutrition Goals Discharge (Final Nutrition Goals Re-Evaluation):   Psychosocial: Target Goals: Acknowledge presence or absence of significant depression and/or stress, maximize coping skills, provide positive support system. Participant is able to verbalize types and ability to use techniques and skills needed for reducing stress and depression.  Initial Review & Psychosocial Screening:  Initial Psych Review & Screening - 10/28/20 1322       Initial Review   Current issues with Current Anxiety/Panic;Current Stress Concerns    Source of Stress Concerns Occupation;Chronic Illness      Family Dynamics   Good Support System? Yes      Barriers   Psychosocial barriers to participate in program There are no identifiable barriers or psychosocial needs.      Screening Interventions   Interventions Encouraged to  exercise;Provide feedback about the scores to participant    Expected Outcomes Short Term goal: Identification and review with participant of any Quality of Life or Depression concerns found by scoring the questionnaire.;Long Term goal: The participant improves quality of Life and PHQ9 Scores as seen by post scores and/or verbalization of changes             Quality of Life Scores:  Quality of Life - 10/28/20 1418       Quality of Life   Select Quality of Life      Quality of Life Scores   Health/Function Pre 27.86 %    Socioeconomic Pre 30 %    Psych/Spiritual Pre 30 %    Family Pre 30 %    GLOBAL Pre 29.09 %            Scores of 19 and below usually indicate a poorer quality of life in these areas.  A difference of  2-3 points is a clinically meaningful difference.  A difference of 2-3 points in the total score of the Quality of Life Index has been associated with significant improvement in overall quality of life, self-image, physical symptoms, and general health in studies assessing change in quality of life.  PHQ-9: Recent Review Flowsheet Data     Depression screen [facility] 2/9 10/28/2020 10/01/2019 11/07/2018   Decreased Interest 0 0 0   Down, Depressed, Hopeless 0 0 0  PHQ - 2 Score 0 0 0   Altered sleeping 0 0 -   Tired, decreased energy 0 0 -   Change in appetite 0 0 -   Feeling bad or failure about yourself  0 0 -   Trouble concentrating 0 0 -   Moving slowly or fidgety/restless 0 0 -   Suicidal thoughts 0 0 -   PHQ-9 Score 0 0 -   Difficult doing work/chores Not difficult at all Not difficult at all -      Interpretation of Total Score  Total Score Depression Severity:  1-4 = Minimal depression, 5-9 = Mild depression, 10-14 = Moderate depression, 15-19 = Moderately severe depression, 20-27 = Severe depression   Psychosocial Evaluation and Intervention:  Psychosocial Evaluation - 10/28/20 1322       Psychosocial Evaluation & Interventions   Interventions  Stress management education;Relaxation education;Encouraged to exercise with the program and follow exercise prescription    Comments Patient has no psychosocial barriers to participate in CR identfied at her orientation visit. Her initial PHQ-9 score was 0 and her QOL was 29.09 overall. She does have anxiety which is managed well with Alprazolam 0.5 mg TID. She denies any depression. She says her support person is her husband. She states he has been very supportative of her over the past year with her multiple health issues. She did start back smoking 4 months ago which she states she is very disappointed in herself but is determined to quit. She says she started due to having increased stress on her job and hanging around with friends that smoke.  She continues to work as Psychologist, counselling and enjoys working very much. She is looking forward to the program and gaining her strength and stamina back.    Expected Outcomes Patient will continue to have no psychosocial barriers identified.    Continue Psychosocial Services  No Follow up required             Psychosocial Re-Evaluation:  Psychosocial Re-Evaluation     [place] Name 11/19/20 [phone]             Psychosocial Re-Evaluation   Current issues with Current Anxiety/Panic       Comments Patient is new to the program completeing 6 sessions. She continues to have no psychosocial barriers identified. She continues on Alprazolam to manage her anxiety. We will continue to montior.       Expected Outcomes Patient's anxiety will continue to be managed at discharge with no additional psychosocial issues identified.       Interventions Encouraged to attend Pulmonary Rehabilitation for the exercise;Relaxation education;Stress management education       Continue Psychosocial Services  No Follow up required                Psychosocial Discharge (Final Psychosocial Re-Evaluation):  Psychosocial Re-Evaluation - 11/19/20 0846       Psychosocial  Re-Evaluation   Current issues with Current Anxiety/Panic    Comments Patient is new to the program completeing 6 sessions. She continues to have no psychosocial barriers identified. She continues on Alprazolam to manage her anxiety. We will continue to montior.    Expected Outcomes Patient's anxiety will continue to be managed at discharge with no additional psychosocial issues identified.    Interventions Encouraged to attend Pulmonary Rehabilitation for the exercise;Relaxation education;Stress management education    Continue Psychosocial Services  No Follow up required  Vocational Rehabilitation: Provide vocational rehab assistance to qualifying candidates.   Vocational Rehab Evaluation & Intervention:  Vocational Rehab - 10/28/20 1236       Initial Vocational Rehab Evaluation & Intervention   Assessment shows need for Vocational Rehabilitation No      Vocational Rehab Re-Evaulation   Comments Patient is currently working and does not need vocational rehab.             Education: Education Goals: Education classes will be provided on a weekly basis, covering required topics. Participant will state understanding/return demonstration of topics presented.  Learning Barriers/Preferences:  Learning Barriers/Preferences - 10/28/20 1319       Learning Barriers/Preferences   Learning Barriers None    Learning Preferences Written Material             Education Topics: Hypertension, Hypertension Reduction -Define heart disease and high blood pressure. Discus how high blood pressure affects the body and ways to reduce high blood pressure. Flowsheet Row CARDIAC REHAB PHASE II EXERCISE from 11/19/2020 in [place]  Date 11/19/20  Educator [place]  Instruction Review Code 2- Demonstrated Understanding       Exercise and Your Heart -Discuss why it is important to exercise, the FITT principles of exercise, normal and abnormal responses to  exercise, and how to exercise safely.   Angina -Discuss definition of angina, causes of angina, treatment of angina, and how to decrease risk of having angina.   Cardiac Medications -Review what the following cardiac medications are used for, how they affect the body, and side effects that may occur when taking the medications.  Medications include Aspirin, Beta blockers, calcium channel blockers, ACE Inhibitors, angiotensin receptor blockers, diuretics, digoxin, and antihyperlipidemics.   Congestive Heart Failure -Discuss the definition of CHF, how to live with CHF, the signs and symptoms of CHF, and how keep track of weight and sodium intake.   Heart Disease and Intimacy -Discus the effect sexual activity has on the heart, how changes occur during intimacy as we age, and safety during sexual activity.   Smoking Cessation / COPD -Discuss different methods to quit smoking, the health benefits of quitting smoking, and the definition of COPD. Flowsheet Row CARDIAC REHAB PHASE II EXERCISE from 02/23/2017 in [place]  Date 02/02/17  Educator [place]  Instruction Review Code 2- Demonstrated Understanding       Nutrition I: Fats -Discuss the types of cholesterol, what cholesterol does to the heart, and how cholesterol levels can be controlled. Flowsheet Row CARDIAC REHAB PHASE II EXERCISE from 02/23/2017 in [place]  Date 02/09/17  Educator [place]  Instruction Review Code 2- Demonstrated Understanding       Nutrition II: Labels -Discuss the different components of food labels and how to read food label [place] from 02/23/2017 in [place]  Date 02/16/17  Educator [place]  Instruction Review Code 2- Demonstrated Understanding       Heart Parts/Heart Disease and PAD -Discuss the anatomy of the heart, the pathway of blood circulation through the heart, and these are affected by heart  disease. Flowsheet Row CARDIAC REHAB PHASE II EXERCISE from 02/23/2017 in [place]  Date 02/23/17  Educator [place]  Instruction Review Code 2- Demonstrated Understanding       Stress I: Signs and Symptoms -Discuss the causes of stress, how stress may lead to anxiety and depression, and ways to limit stress. [place] REHAB PHASE  II EXERCISE from 11/19/2020 in [place]  Date 11/05/20  Educator hj  Instruction Review Code 2- Demonstrated Understanding       Stress II: Relaxation -Discuss different types of relaxation techniques to limit stress. Flowsheet Row CARDIAC REHAB PHASE II EXERCISE from 11/19/2020 in [place]  Date 11/12/20  Educator DF  Instruction Review Code 2- Demonstrated Understanding       Warning Signs of Stroke / TIA -Discuss definition of a stroke, what the signs and symptoms are of a stroke, and how to identify when someone is having stroke.   Knowledge Questionnaire Score:  Knowledge Questionnaire Score - 10/28/20 1319       Knowledge Questionnaire Score   Pre Score 21/24             Core Components/Risk Factors/Patient Goals at Admission:  Personal Goals and Risk Factors at Admission - 10/28/20 1320       Core Components/Risk Factors/Patient Goals on Admission    Weight Management Obesity    Tobacco Cessation Yes    Number of packs per day 10    Intervention Offer self-teaching materials, assist with locating and accessing local/national Quit Smoking programs, and support quit date choice.    Expected Outcomes Short Term: Will quit all tobacco product use, adhering to prevention of relapse plan.    Personal Goal Other Yes    Personal Goal Increase strenght and stamina. Get back into exercising. Be able to return to her ADL's. Stop smoking.    Intervention Patient will attend CR 3 days/week and supplement with exercise at home 2 days/week.    Expected Outcomes  Patient will meet both personal and program goals.             Core Components/Risk Factors/Patient Goals Review:   Goals and Risk Factor Review     Row Name 11/19/20 0847             Core Components/Risk Factors/Patient Goals Review   Personal Goals Review Improve shortness of breath with ADL's;Weight Management/Obesity;Other       Review Patient was referred to CR with S/P TAVR. She has completed 6 sessions maintaining her weight since her initial visit. She has multiple risk factors for CAD and is participating in the program for risk modification. Her blood pressure is well controlled. Her personal goals for the program are to increase her strength and stamina; quit smoking; and get back to doing her ADL's. She says she is smoking 1 ppd. She just recently started back smoking. She plans to see her PCP to try Chantix to help her quit. We will continue to monitor her progress as she works toward meeting these goals.       Expected Outcomes Patient will complete the program meeting both personal and program goals.                Core Components/Risk Factors/Patient Goals at Discharge (Final Review):   Goals and Risk Factor Review - 11/19/20 0847       Core Components/Risk Factors/Patient Goals Review   Personal Goals Review Improve shortness of breath with ADL's;Weight Management/Obesity;Other    Review Patient was referred to CR with S/P TAVR. She has completed 6 sessions maintaining her weight since her initial visit. She has multiple risk factors for CAD and is participating in the program for risk modification. Her blood pressure is well controlled. Her personal goals for the program are to increase her strength and stamina; quit smoking; and  get back to doing her ADL's. She says she is smoking 1 ppd. She just recently started back smoking. She plans to see her PCP to try Chantix to help her quit. We will continue to monitor her progress as she works toward meeting these goals.     Expected Outcomes Patient will complete the program meeting both personal and program goals.             ITP Comments:   Comments: ITP REVIEW Pt is making expected progress toward Cardiac Rehab goals after completing 9 sessions. Recommend continued exercise, life style modification, education, and increased stamina and strength.

## 2020-11-28 ENCOUNTER — Encounter (HOSPITAL_COMMUNITY): Payer: Medicare HMO

## 2020-12-01 ENCOUNTER — Encounter (HOSPITAL_COMMUNITY): Payer: Medicare HMO

## 2020-12-03 ENCOUNTER — Encounter (HOSPITAL_COMMUNITY): Payer: Medicare HMO

## 2020-12-05 ENCOUNTER — Encounter (HOSPITAL_COMMUNITY): Payer: Medicare HMO

## 2020-12-08 ENCOUNTER — Other Ambulatory Visit: Payer: Self-pay | Admitting: Cardiology

## 2020-12-08 ENCOUNTER — Encounter (HOSPITAL_COMMUNITY): Payer: Medicare HMO

## 2020-12-10 ENCOUNTER — Encounter (HOSPITAL_COMMUNITY): Payer: Medicare HMO

## 2020-12-10 NOTE — Progress Notes (Signed)
Discharge Progress Report  Patient Details  Name: Emily Rocha MRN: [id number] Date of Birth: [birth year] Referring Provider:   Flowsheet Row CARDIAC REHAB PHASE II ORIENTATION from 10/28/2020 in [place]  Referring Provider Dr. Angelena Form        Number of Visits: 9  Reason for Discharge:  Early Exit:  Personal  Smoking History:  Social History   Tobacco Use  Smoking Status Every Day   Years: 40.00   Types: Cigarettes   Last attempt to quit: 10/24/2015   Years since quitting: 5.1  Smokeless Tobacco Never  Tobacco Comments   Patient started back smoking 4 months ago. She is currently smoking 10 cigarettes/day and is trying to quit.     Diagnosis:  S/P TAVR (transcatheter aortic valve replacement)  Chronic obstructive pulmonary disease, unspecified COPD type ([place])  ADL UCSD:   Initial Exercise Prescription:  Initial Exercise Prescription - 10/28/20 1400       Date of Initial Exercise RX and Referring Provider   Date 10/28/20    Referring Provider Dr. Angelena Form    Expected Discharge Date 01/23/21      NuStep   Level 1    SPM 80    Minutes 39      Prescription Details   Frequency (times per week) 3    Duration Progress to 30 minutes of continuous aerobic without signs/symptoms of physical distress      Intensity   THRR 40-80% of Max Heartrate 61-122    Ratings of Perceived Exertion 11-13    Perceived Dyspnea 0-4      Resistance Training   Training Prescription Yes    Weight 2 lbs    Reps 10-15             Discharge Exercise Prescription (Final Exercise Prescription Changes):  Exercise Prescription Changes - 11/21/20 1100       Response to Exercise   Blood Pressure (Admit) 115/60    Blood Pressure (Exercise) 125/60    Blood Pressure (Exit) 115/60    Heart Rate (Admit) 90 bpm    Heart Rate (Exercise) 108 bpm    Heart Rate (Exit) 99 bpm    Rating of Perceived Exertion (Exercise) 11    Duration Continue with 30 min of  aerobic exercise without signs/symptoms of physical distress.    Intensity THRR unchanged      Progression   Progression Continue to progress workloads to maintain intensity without signs/symptoms of physical distress.      Resistance Training   Training Prescription Yes    Weight 2    Reps 10-15    Time 10 Minutes      NuStep   Level 1    SPM 81    Minutes 39    METs 1.9             Functional Capacity:  6 Minute Walk     Row Name 10/28/20 1411         6 Minute Walk   Phase Initial     Distance 1275 feet     Walk Time 6 minutes     # of Rest Breaks 0     MPH 2.41     METS 2.77     RPE 11     Perceived Dyspnea  13     VO2 Peak 9.7     Symptoms No     Resting HR 81 bpm     Resting BP 110/60  Resting Oxygen Saturation  93 %     Exercise Oxygen Saturation  during 6 min walk 86 %     Max Ex. HR 95 bpm     Max Ex. BP 116/72     2 Minute Post BP 100/68       Interval HR   Interval Heart Rate? Yes              Psychological, QOL, Others - Outcomes: PHQ 2/9: Depression screen [facility] 2/9 10/28/2020 10/01/2019 11/07/2018  Decreased Interest 0 0 0  Down, Depressed, Hopeless 0 0 0  PHQ - 2 Score 0 0 0  Altered sleeping 0 0 -  Tired, decreased energy 0 0 -  Change in appetite 0 0 -  Feeling bad or failure about yourself  0 0 -  Trouble concentrating 0 0 -  Moving slowly or fidgety/restless 0 0 -  Suicidal thoughts 0 0 -  PHQ-9 Score 0 0 -  Difficult doing work/chores Not difficult at all Not difficult at all -  Some recent data might be hidden    Quality of Life:  Quality of Life - 10/28/20 1418       Quality of Life   Select Quality of Life      Quality of Life Scores   Health/Function Pre 27.86 %    Socioeconomic Pre 30 %    Psych/Spiritual Pre 30 %    Family Pre 30 %    GLOBAL Pre 29.09 %             Personal Goals: Goals established at orientation with interventions provided to work toward goal.  Personal Goals and Risk Factors at  Admission - 10/28/20 1320       Core Components/Risk Factors/Patient Goals on Admission    Weight Management Obesity    Tobacco Cessation Yes    Number of packs per day 10    Intervention Offer self-teaching materials, assist with locating and accessing local/national Quit Smoking programs, and support quit date choice.    Expected Outcomes Short Term: Will quit all tobacco product use, adhering to prevention of relapse plan.    Personal Goal Other Yes    Personal Goal Increase strenght and stamina. Get back into exercising. Be able to return to her ADL's. Stop smoking.    Intervention Patient will attend CR 3 days/week and supplement with exercise at home 2 days/week.    Expected Outcomes Patient will meet both personal and program goals.              Personal Goals Discharge:  Goals and Risk Factor Review     Row Name 11/19/20 0847             Core Components/Risk Factors/Patient Goals Review   Personal Goals Review Improve shortness of breath with ADL's;Weight Management/Obesity;Other       Review Patient was referred to CR with S/P TAVR. She has completed 6 sessions maintaining her weight since her initial visit. She has multiple risk factors for CAD and is participating in the program for risk modification. Her blood pressure is well controlled. Her personal goals for the program are to increase her strength and stamina; quit smoking; and get back to doing her ADL's. She says she is smoking 1 ppd. She just recently started back smoking. She plans to see her PCP to try Chantix to help her quit. We will continue to monitor her progress as she works toward meeting these goals.  Expected Outcomes Patient will complete the program meeting both personal and program goals.                Exercise Goals and Review:  Exercise Goals     Row Name 10/28/20 1416 11/24/20 1345           Exercise Goals   Increase Physical Activity Yes Yes      Intervention Provide advice,  education, support and counseling about physical activity/exercise needs.;Develop an individualized exercise prescription for aerobic and resistive training based on initial evaluation findings, risk stratification, comorbidities and participant's personal goals. Provide advice, education, support and counseling about physical activity/exercise needs.;Develop an individualized exercise prescription for aerobic and resistive training based on initial evaluation findings, risk stratification, comorbidities and participant's personal goals.      Expected Outcomes Short Term: Attend rehab on a regular basis to increase amount of physical activity.;Long Term: Add in home exercise to make exercise part of routine and to increase amount of physical activity.;Long Term: Exercising regularly at least 3-5 days a week. Short Term: Attend rehab on a regular basis to increase amount of physical activity.;Long Term: Add in home exercise to make exercise part of routine and to increase amount of physical activity.;Long Term: Exercising regularly at least 3-5 days a week.      Increase Strength and Stamina Yes Yes      Intervention Provide advice, education, support and counseling about physical activity/exercise needs.;Develop an individualized exercise prescription for aerobic and resistive training based on initial evaluation findings, risk stratification, comorbidities and participant's personal goals. Provide advice, education, support and counseling about physical activity/exercise needs.;Develop an individualized exercise prescription for aerobic and resistive training based on initial evaluation findings, risk stratification, comorbidities and participant's personal goals.      Expected Outcomes Short Term: Increase workloads from initial exercise prescription for resistance, speed, and METs.;Short Term: Perform resistance training exercises routinely during rehab and add in resistance training at home;Long Term:  Improve cardiorespiratory fitness, muscular endurance and strength as measured by increased METs and functional capacity (6MWT) Short Term: Increase workloads from initial exercise prescription for resistance, speed, and METs.;Short Term: Perform resistance training exercises routinely during rehab and add in resistance training at home;Long Term: Improve cardiorespiratory fitness, muscular endurance and strength as measured by increased METs and functional capacity (6MWT)      Able to understand and use rate of perceived exertion (RPE) scale Yes Yes      Intervention Provide education and explanation on how to use RPE scale Provide education and explanation on how to use RPE scale      Expected Outcomes Short Term: Able to use RPE daily in rehab to express subjective intensity level;Long Term:  Able to use RPE to guide intensity level when exercising independently Short Term: Able to use RPE daily in rehab to express subjective intensity level;Long Term:  Able to use RPE to guide intensity level when exercising independently      Intervention -- --      Knowledge and understanding of Target Heart Rate Range (THRR) Yes Yes      Intervention Provide education and explanation of THRR including how the numbers were predicted and where they are located for reference Provide education and explanation of THRR including how the numbers were predicted and where they are located for reference      Expected Outcomes Short Term: Able to state/look up THRR;Long Term: Able to use THRR to govern intensity when exercising independently;Short Term: Able to  use daily as guideline for intensity in rehab Short Term: Able to state/look up THRR;Long Term: Able to use THRR to govern intensity when exercising independently;Short Term: Able to use daily as guideline for intensity in rehab      Able to check pulse independently Yes Yes      Intervention Provide education and demonstration on how to check pulse in carotid and radial  arteries.;Review the importance of being able to check your own pulse for safety during independent exercise Provide education and demonstration on how to check pulse in carotid and radial arteries.;Review the importance of being able to check your own pulse for safety during independent exercise      Expected Outcomes Long Term: Able to check pulse independently and accurately;Short Term: Able to explain why pulse checking is important during independent exercise Long Term: Able to check pulse independently and accurately;Short Term: Able to explain why pulse checking is important during independent exercise      Understanding of Exercise Prescription Yes Yes      Intervention Provide education, explanation, and written materials on patient's individual exercise prescription Provide education, explanation, and written materials on patient's individual exercise prescription      Expected Outcomes Long Term: Able to explain home exercise prescription to exercise independently;Short Term: Able to explain program exercise prescription Short Term: Able to explain program exercise prescription               Exercise Goals Re-Evaluation:  Exercise Goals Re-Evaluation     [place] Name 11/24/20 1347             Exercise Goal Re-Evaluation   Exercise Goals Review Increase Physical Activity;Increase Strength and Stamina;Able to understand and use rate of perceived exertion (RPE) scale;Knowledge and understanding of Target Heart Rate Range (THRR);Able to check pulse independently;Understanding of Exercise Prescription       Comments Pt has completed 9 sessions of cardiac rehab. She has complained of COPD while in class and feeling SOB. She is currently working at 1.90 METs on the stepper. Will monitor and progress as able.       Expected Outcomes Through exercise at rehab and at home, the patient will meet their stated goals.                Nutrition & Weight - Outcomes:  Pre Biometrics - 10/28/20  1417       Pre Biometrics   Height 5\' 4"  (1.626 m)    Weight 164 lb 7.4 oz (74.6 kg)    Waist Circumference 44 inches    Hip Circumference 43 inches    Waist to Hip Ratio 1.02 %    BMI (Calculated) 28.22    Triceps Skinfold 20 mm    % Body Fat 40.7 %    Grip Strength 18.9 kg    Flexibility 17.5 in    Single Leg Stand 4.32 seconds              Nutrition:  Nutrition Therapy & Goals - 10/28/20 1237       Personal Nutrition Goals   Comments Patient socred 3 on her diet assessment. She says she is trying to eat heart healthy. Handout provided and explained regarding healthier choices. We offer 2 educational sessions on heart heatlhy nutrition with handouts.      Intervention Plan   Intervention Nutrition handout(s) given to patient.             Nutrition Discharge:  Nutrition Assessments - 10/28/20 1236  MEDFICTS Scores   Pre Score 3             Education Questionnaire Score:  Knowledge Questionnaire Score - 10/28/20 1319       Knowledge Questionnaire Score   Pre Score 21/24             Pt wishes to be dropped from the CR program after 9 visits due to work. This is her third time entering and not completing the program.

## 2020-12-12 ENCOUNTER — Encounter (HOSPITAL_COMMUNITY): Payer: Medicare HMO

## 2020-12-15 ENCOUNTER — Encounter (HOSPITAL_COMMUNITY): Payer: Medicare HMO

## 2020-12-17 ENCOUNTER — Encounter (HOSPITAL_COMMUNITY): Payer: Medicare HMO

## 2020-12-19 ENCOUNTER — Encounter (HOSPITAL_COMMUNITY): Payer: Medicare HMO

## 2020-12-22 ENCOUNTER — Encounter (HOSPITAL_COMMUNITY): Payer: Medicare HMO

## 2020-12-24 ENCOUNTER — Encounter (HOSPITAL_COMMUNITY): Payer: Medicare HMO

## 2020-12-26 ENCOUNTER — Encounter (HOSPITAL_COMMUNITY): Payer: Medicare HMO

## 2020-12-29 ENCOUNTER — Encounter (HOSPITAL_COMMUNITY): Payer: Medicare HMO

## 2020-12-31 ENCOUNTER — Encounter (HOSPITAL_COMMUNITY): Payer: Medicare HMO

## 2021-01-02 ENCOUNTER — Encounter (HOSPITAL_COMMUNITY): Payer: Medicare HMO

## 2021-01-05 ENCOUNTER — Encounter (HOSPITAL_COMMUNITY): Payer: Medicare HMO

## 2021-01-07 ENCOUNTER — Encounter (HOSPITAL_COMMUNITY): Payer: Medicare HMO

## 2021-01-09 ENCOUNTER — Encounter (HOSPITAL_COMMUNITY): Payer: Medicare HMO

## 2021-01-12 ENCOUNTER — Encounter (HOSPITAL_COMMUNITY): Payer: Medicare HMO

## 2021-01-14 ENCOUNTER — Encounter (HOSPITAL_COMMUNITY): Payer: Medicare HMO

## 2021-01-14 NOTE — Progress Notes (Signed)
Cardiology Office Note    Date:  01/15/2021   ID:  Emily Rocha, DOB Jul 07, 1955, MRN [id number]  PCP:  Glenda Chroman, MD  Cardiologist: Carlyle Dolly, MD    Chief Complaint  Patient presents with   Follow-up    3 month visit    History of Present Illness:    Emily Rocha is a 65 y.o. female with past medical history of CAD (s/p NSTEMI in 11/2016 and required CABG with LIMA-LAD, SVG-LCx and SVG-D1, cath in 05/2020 showing severe RCA stenosis and treated with orbital atherectomy and stent placement), history of ischemic cardiomyopathy (EF 45-50% in 11/2016, normalized by repeat imaging), severe AS (s/p TAVR on 08/12/2020), HTN, HLD, COPD, history of lung cancer (s/p right upper lobectomy in 2020) and history of TIA's (s/p right subclavian and right common carotid bypass in 12/2017) who presents to the office today for 26-month follow-up.   She was last examined by myself in 10/2020 and denied any recent chest pain or palpitations. She did have baseline dyspnea on exertion but felt like this was likely secondary to her COPD. She was continued on her current cardiac medications at that time and it was reviewed with Dr. Angelena Form that she could stop Plavix on 02/11/2021 given that her stent in 05/2020 had been placed in the setting of stable CAD without ACS.  In talking with the patient today, she reports feeling the best she has in over 5 years and denies any recent chest pain or palpitations. She does experience some dyspnea on exertion in the setting of COPD but says her breathing has overall improved since stent placement and TAVR earlier this year. She unfortunately has resumed smoking and is now smoking approximately 1 pack/day. She has bought nicotine lozenges and is hopeful she will be able to quit by 02/2021.  She denies any recent orthopnea, PND or pitting edema. She does take Torsemide on a daily basis unless she has somewhere to travel to that day.  Past Medical History:  Diagnosis  Date   Anxiety    Asthma    COPD (chronic obstructive pulmonary disease) ([place])    Coronary artery disease    a. s/p NSTEMI in 11/2016 and required CABG with LIMA-LAD, SVG-LCx and SVG-D1 and complicated by cardiogenic shock b. cath in 05/2020 showing severe RCA stenosis and treated with orbital atherectomy and stent placement   GERD (gastroesophageal reflux disease)    History of CVA (cerebrovascular accident)    History of lung cancer 2020   Peripheral vascular disease ([place])    S/P TAVR (transcatheter aortic valve replacement) 08/12/2020   s/p TAVR with a 23 mm Edwards S3U via the TF approach by Dr. Angelena Form & Dr. Cyndia Bent   Severe aortic stenosis     Past Surgical History:  Procedure Laterality Date   AORTIC ARCH ANGIOGRAPHY N/A 11/04/2017   Procedure: AORTIC ARCH ANGIOGRAPHY;  Surgeon: Elam Dutch, MD;  Location: [facility];  Service: Cardiovascular;  Laterality: N/A;   APPLICATION OF WOUND VAC N/A 01/06/2018   Procedure: APPLICATION OF WOUND VAC;  Surgeon: Ivin Poot, MD;  Location: [facility];  Service: Thoracic;  Laterality: N/A;   APPLICATION OF WOUND VAC N/A 01/13/2018   Procedure: WOUND VAC CHANGE;  Surgeon: Ivin Poot, MD;  Location: [place];  Service: Thoracic;  Laterality: N/A;   CARDIAC CATHETERIZATION     CAROTID-SUBCLAVIAN BYPASS GRAFT Right 12/26/2017   Procedure: AORTIC TO RIGHT COMMON CAROTID AND RIGHT SUBCLAVIAN  ARTERY  BYPASS;  Surgeon: Elam Dutch, MD;  Location: [place];  Service: Vascular;  Laterality: Right;   CHOLECYSTECTOMY     CLOSURE OF DIAPHRAGM  12/26/2017   Procedure: REPAIR OF DIAPHRAGM;  Surgeon: Ivin Poot, MD;  Location: [facility];  Service: Thoracic;;   COLONOSCOPY N/A 10/03/2014   Procedure: COLONOSCOPY;  Surgeon: Rogene Houston, MD;  [facility];  Service: Endoscopy;  Laterality: N/A;  730   CORONARY ARTERY BYPASS GRAFT N/A 12/14/2016   Procedure: CORONARY ARTERY BYPASS GRAFTING (CABG) x three , using left  internal mammary artery and right leg greater saphenous vein harvested endoscopically;  Surgeon: Ivin Poot, MD;  Location: [facility] [place];  Service: Open Heart Surgery;  Laterality: N/A;   CORONARY ATHERECTOMY N/A 06/11/2020   Procedure: CORONARY ATHERECTOMY;  Surgeon: Burnell Blanks, MD;  Location: [facility];  Service: Cardiovascular;  Laterality: N/A;   CORONARY STENT INTERVENTION N/A 06/11/2020   Procedure: CORONARY STENT INTERVENTION;  Surgeon: Burnell Blanks, MD;  Location: [facility];  Service: Cardiovascular;  Laterality: N/A;   ENDARTERECTOMY Right 12/06/2014   Procedure: ENDARTERECTOMY CAROTid;  Surgeon: Elam Dutch, MD;  Location: [facility];  Service: Vascular;  Laterality: Right;   ESOPHAGOGASTRODUODENOSCOPY N/A 12/03/2016   Procedure: ESOPHAGOGASTRODUODENOSCOPY (EGD);  Surgeon: Rogene Houston, MD;  [facility];  Service: Endoscopy;  Laterality: N/A;  7:30   INTRAVASCULAR IMAGING/OCT N/A 06/11/2020   Procedure: INTRAVASCULAR IMAGING/OCT;  Surgeon: Burnell Blanks, MD;  Location: [place] [facility];  Service: Cardiovascular;  Laterality: N/A;   INTRAVASCULAR PRESSURE WIRE/FFR STUDY N/A 06/04/2020   Procedure: INTRAVASCULAR PRESSURE WIRE/FFR STUDY;  Surgeon: Burnell Blanks, MD;  Location: [facility];  Service: Cardiovascular;  Laterality: N/A;   LEFT HEART CATH AND CORONARY ANGIOGRAPHY N/A 12/14/2016   Procedure: LEFT HEART CATH AND CORONARY ANGIOGRAPHY;  Surgeon: Burnell Blanks, MD;  Location: [facility];  Service: Cardiovascular;  Laterality: N/A;   LOBECTOMY Right 10/26/2018   Procedure: RIGHT UPPER LOBECTOMY;  Surgeon: Lajuana Matte, MD;  Location: [place];  Service: Thoracic;  Laterality: Right;   PERIPHERAL VASCULAR CATHETERIZATION N/A 12/05/2014   Procedure:  Carotid Arch Angiography;  Surgeon: Conrad Sharon, MD;  Location: [facility];  Service: Cardiovascular;  Laterality: N/A;    RIGHT/LEFT HEART CATH AND CORONARY/GRAFT ANGIOGRAPHY N/A 06/04/2020   Procedure: RIGHT/LEFT HEART CATH AND CORONARY/GRAFT ANGIOGRAPHY;  Surgeon: Burnell Blanks, MD;  Location: [place] [facility];  Service: Cardiovascular;  Laterality: N/A;   STERNAL WOUND DEBRIDEMENT N/A 01/06/2018   Procedure: STERNAL WOUND DEBRIDEMENT;  Surgeon: Ivin Poot, MD;  Location: [place];  Service: Thoracic;  Laterality: N/A;   STERNOTOMY N/A 12/26/2017   Procedure: REDO STERNOTOMY;  Surgeon: Prescott Gum, Collier Salina, MD;  Location: [facility];  Service: Thoracic;  Laterality: N/A;   TEE WITHOUT CARDIOVERSION N/A 12/14/2016   Procedure: TRANSESOPHAGEAL ECHOCARDIOGRAM (TEE);  Surgeon: Prescott Gum, Collier Salina, MD;  Location: [place];  Service: Open Heart Surgery;  Laterality: N/A;   TEE WITHOUT CARDIOVERSION N/A 08/12/2020   Procedure: TRANSESOPHAGEAL ECHOCARDIOGRAM (TEE);  Surgeon: Burnell Blanks, MD;  Location: [facility];  Service: Open Heart Surgery;  Laterality: N/A;   TRANSCATHETER AORTIC VALVE REPLACEMENT, TRANSFEMORAL N/A 08/12/2020   Procedure: TRANSCATHETER AORTIC VALVE REPLACEMENT, TRANSFEMORAL;  Surgeon: Burnell Blanks, MD;  Location: [facility];  Service: Open Heart Surgery;  Laterality: N/A;   VIDEO ASSISTED THORACOSCOPY (VATS)/WEDGE  RESECTION Right 10/26/2018   Procedure: VIDEO ASSISTED THORACOSCOPY (VATS)/WEDGE RESECTION;  Surgeon: Lajuana Matte, MD;  Location: [place];  Service: Thoracic;  Laterality: Right;   VIDEO BRONCHOSCOPY N/A 10/26/2018   Procedure: VIDEO BRONCHOSCOPY;  Surgeon: Lajuana Matte, MD;  [facility];  Service: Thoracic;  Laterality: N/A;    Current Medications: Outpatient Medications Prior to Visit  Medication Sig Dispense Refill   albuterol (VENTOLIN HFA) 108 (90 Base) MCG/ACT inhaler Inhale 1-2 puffs into the lungs every 6 (six) hours as needed for wheezing or shortness of breath.     ALPRAZolam (XANAX) 0.5 MG tablet Take 0.5 mg by mouth 3 (three)  times daily.     Ascorbic Acid (VITAMIN C GUMMIE PO) Chew 500 mg by mouth daily.     aspirin EC 81 MG tablet Take 1 tablet (81 mg total) by mouth daily. Swallow whole. 90 tablet 3   atorvastatin (LIPITOR) 40 MG tablet Take 1 tablet (40 mg total) daily by mouth. 30 tablet 1   cholecalciferol (VITAMIN D3) 25 MCG (1000 UNIT) tablet Take 1,000 Units by mouth in the morning.     diphenhydramine-acetaminophen (TYLENOL PM) 25-500 MG TABS tablet Take 2 tablets by mouth at bedtime.     metoprolol succinate (TOPROL-XL) 50 MG 24 hr tablet Take 50 mg by mouth daily. Take with or immediately following a meal.     Multiple Vitamin (MULTIVITAMIN WITH MINERALS) TABS tablet Take 1 tablet by mouth daily.     pantoprazole (PROTONIX) 40 MG tablet Take 40 mg by mouth in the morning.     potassium chloride SA (KLOR-CON) 20 MEQ tablet Take 20 mEq by mouth in the morning.     Tiotropium Bromide-Olodaterol (STIOLTO RESPIMAT) 2.5-2.5 MCG/ACT AERS Inhale 2 puffs into the lungs daily. 1 each 4   torsemide (DEMADEX) 20 MG tablet Take 40 mg by mouth See admin instructions. Take 2 tablets (40 mg) by mouth scheduled on Mondays, Tuesdays, Wednesdays, Thursdays, Fridays & Saturdays. (Hold dose on Sundays)     clopidogrel (PLAVIX) 75 MG tablet TAKE ONE (1) TABLET BY MOUTH EVERY DAY 90 tablet 1   albuterol (PROVENTIL) (2.5 MG/3ML) 0.083% nebulizer solution Take 2.5 mg by nebulization every 4 (four) hours as needed for wheezing or shortness of breath. (Patient not taking: Reported on 01/15/2021)     No facility-administered medications prior to visit.     Allergies:   Patient has no known allergies.   Social History   Socioeconomic History   Marital status: Married    Spouse name: Not on file   Number of children: Not on file   Years of education: Not on file   Highest education level: Not on file  Occupational History   Not on file  Tobacco Use   Smoking status: Every Day    Years: 40.00    Types: Cigarettes    Last  attempt to quit: 10/24/2015    Years since quitting: 5.2   Smokeless tobacco: Never   Tobacco comments:    Patient started back smoking 4 months ago. She is currently smoking 10 cigarettes/day and is trying to quit.   Vaping Use   Vaping Use: Former   Start date: 10/23/2013   Quit date: 10/24/2015  Substance and Sexual Activity   Alcohol use: No    Alcohol/week: 0.0 standard drinks   Drug use: No   Sexual activity: Not on file  Other Topics Concern   Not on file  Social History Narrative   Not  on file   Social Determinants of Health   Financial Resource Strain: Not on file  Food Insecurity: Not on file  Transportation Needs: Not on file  Physical Activity: Not on file  Stress: Not on file  Social Connections: Not on file     Family History:  The patient's family history includes Cancer in her mother; Diabetes in her mother; Heart attack in her father and mother; Heart disease in her father and mother.   Review of Systems:    Please see the history of present illness.     All other systems reviewed and are otherwise negative except as noted above.   Physical Exam:    VS:  BP 138/74   Pulse 85   Ht 5\' 4"  (1.626 m)   Wt 155 lb (70.3 kg)   SpO2 94%   BMI 26.61 kg/m    General: Well developed, well nourished,female appearing in no acute distress. Head: Normocephalic, atraumatic. Neck: No carotid bruits. JVD not elevated.  Lungs: Respirations regular and unlabored, without wheezes or rales.  Heart: Regular rate and rhythm. No S3 or S4.  No murmur, no rubs, or gallops appreciated. Abdomen: Appears non-distended. No obvious abdominal masses. Msk:  Strength and tone appear normal for age. No obvious joint deformities or effusions. Extremities: No clubbing or cyanosis. No pitting edema.  Distal pedal pulses are 2+ bilaterally. Neuro: Alert and oriented X 3. Moves all extremities spontaneously. No focal deficits noted. Psych:  Responds to questions appropriately with a normal  affect. Skin: No rashes or lesions noted  Wt Readings from Last 3 Encounters:  01/15/21 155 lb (70.3 kg)  11/10/20 164 lb 0.4 oz (74.4 kg)  10/28/20 164 lb 7.4 oz (74.6 kg)     Studies/Labs Reviewed:   EKG:  EKG is not ordered today.    Recent Labs: 08/08/2020: ALT 12 08/13/2020: Magnesium 1.9 08/14/2020: BUN 5; Creatinine, Ser 0.47; Hemoglobin 9.3; Platelets 198; Potassium 4.5; Sodium 132   Lipid Panel    Component Value Date/Time   CHOL 205 (H) 12/13/2016 1023   TRIG 151 (H) 10/24/2019 1316   HDL 68 12/13/2016 1023   CHOLHDL 3.0 12/13/2016 1023   VLDL 20 12/13/2016 1023   LDLCALC 117 (H) 12/13/2016 1023    Additional studies/ records that were reviewed today include:   R/LHC: 05/2020 Ost LM to LM lesion is 99% stenosed. Ost 2nd Diag to 2nd Diag lesion is 40% stenosed. Mid LAD lesion is 30% stenosed. Prox LAD to Mid LAD lesion is 40% stenosed. Ost Cx to Prox Cx lesion is 100% stenosed. Mid RCA lesion is 30% stenosed. Prox RCA lesion is 80% stenosed. Ost RPDA to RPDA lesion is 90% stenosed. Dist RCA lesion is 50% stenosed. 2nd Mrg lesion is 50% stenosed. Mid LM to Prox LAD lesion is 95% stenosed. Mid Graft to Insertion lesion is 100% stenosed.   1. Severe left main stenosis 2. Severe ostial LAD stenosis. Patent Vein graft to the Diagonal branch that fills the LAD. The LIMA in injected and does not fill the LAD although it may just be atretic distally. It is not clear that the LIMA was anastomosed to the LAD. The operative note indicates the LIMA was used.  3. Chronic occlusion ostial Circumflex. Patent vein graft to the obtuse marginal branch with moderate disease in the obtuse marginal branch.  4. The RCA is a large dominant vessel. There is a heavily calcified proximal stenosis that appears to be severe angiographically. RFR with flow wire was  0.89 suggesting the lesion is flow limiting.  5. Severe aortic stenosis (mean gradient 40.3 mmHg, peak to peak gradient 55  mmHg, AVA 0.75 cm2)   Recommendations: She will need to have staged PCI of the RCA with orbital atherectomy and stenting. I will plan this for next week. After the PCI, will then continue planning for TAVR with CT scans.   Coronary Atherectomy: 05/2020 Ost RPDA to RPDA lesion is 90% stenosed. Dist RCA lesion is 50% stenosed. Mid RCA lesion is 30% stenosed. Prox RCA lesion is 80% stenosed. Post intervention, there is a 0% residual stenosis. A drug-eluting stent was successfully placed using a SYNERGY XD 3.0X38.   1. Severe, heavily calcified stenosis in the proximal to mid RCA 2. Successful PTCA/orbital atherectomy/stenting proximal to mid RCA   Recommendations: Will continue DAPT with ASA and Plavix. Continue workup for TAVR  Echocardiogram: 09/2020 IMPRESSIONS     1. Left ventricular ejection fraction, by estimation, is 60 to 65%. The  left ventricle has normal function. The left ventricle has no regional  wall motion abnormalities. There is mild left ventricular hypertrophy.  Left ventricular diastolic parameters  were normal.   2. Right ventricular systolic function is normal. The right ventricular  size is normal. There is normal pulmonary artery systolic pressure. The  estimated right ventricular systolic pressure is 57.3 mmHg.   3. Left atrial size was upper normal.   4. The mitral valve is abnormal. Trivial mitral valve regurgitation.   5. The aortic valve has been repaired/replaced. Aortic valve  regurgitation is mild. There is a 23 mm Sapien prosthetic (TAVR) valve  present in the aortic position. Aortic valve mean gradient measures 11.0  mmHg.   6. The inferior vena cava is normal in size with greater than 50%  respiratory variability, suggesting right atrial pressure of 3 mmHg.    Assessment:    1. Coronary artery disease involving native coronary artery of native heart without angina pectoris   2. Chronic diastolic CHF (congestive heart failure) ([place])   3.  Severe aortic stenosis   4. Essential hypertension   5. Hyperlipidemia LDL goal <70   6. Tobacco use      Plan:   In order of problems listed above:  1. CAD - She is s/p NSTEMI in 11/2016 and required CABG with LIMA-LAD, SVG-LCx and SVG-D1. Most recent cath in 05/2020 showed severe RCA stenosis and was treated with orbital atherectomy and stent placement. - She has baseline dyspnea on exertion in the setting of COPD and tobacco use but says her dyspnea has significantly improved and she denies any recent chest pain. - Continue ASA 81 mg daily, Toprol-XL 50 mg daily and Atorvastatin 40 mg daily. I previously reviewed with Dr. Angelena Form and she can stop Plavix on 02/11/2021.  2. HFpEF - Her EF was slightly declined at 45 to 50% in 2018 but has normalized by repeat imaging and was at 60 to 65% in 09/2020. She denies any recent orthopnea, PND or lower extremity edema and her weight has declined by 9 pounds since her last office visit. - Will continue Torsemide 40 mg daily. Will recheck a BMET for reassessment of electrolytes and renal function.  3. Severe AS - She is s/p TAVR on 08/12/2020 and echocardiogram in 09/2020 showed mild AI and mean gradient was at 11 mmHg.  4. HTN - Her blood pressure is at 138/74 during today's visit. Continue current medication regimen with Toprol-XL 50 mg daily.   5. HLD - Previously  followed by her PCP but she has not had recent labs. Will recheck FLP and LFT's. She remains on Atorvastatin 40mg  daily.   6. Tobacco Use - She has resumed smoking 1.0 ppd. She did recently purchase nicotine lozenges and we also reviewed she could use patches to help reduce her use as she had previously quit for 4 years.    Medication Adjustments/Labs and Tests Ordered: Current medicines are reviewed at length with the patient today.  Concerns regarding medicines are outlined above.  Medication changes, Labs and Tests ordered today are listed in the Patient Instructions  below. Patient Instructions  Medication Instructions:  STOP Plavix on 02/11/21   Labwork: CMET and Fasting Lipids   Testing/Procedures: None today   Follow-Up: 6 months Dr.Branch  Any Other Special Instructions Will Be Listed Below (If Applicable).  If you need a refill on your cardiac medications before your next appointment, please call your pharmacy.   Signed, Erma Heritage, PA-C  01/15/2021 1:41 PM    [facility] [address] [place], [place] [address] Phone: [phone] Fax: [phone]

## 2021-01-15 ENCOUNTER — Ambulatory Visit: Payer: Medicare HMO | Admitting: Student

## 2021-01-15 ENCOUNTER — Other Ambulatory Visit: Payer: Self-pay

## 2021-01-15 ENCOUNTER — Encounter: Payer: Self-pay | Admitting: Student

## 2021-01-15 VITALS — BP 138/74 | HR 85 | Ht 64.0 in | Wt 155.0 lb

## 2021-01-15 DIAGNOSIS — I2510 Atherosclerotic heart disease of native coronary artery without angina pectoris: Secondary | ICD-10-CM | POA: Diagnosis not present

## 2021-01-15 DIAGNOSIS — E785 Hyperlipidemia, unspecified: Secondary | ICD-10-CM

## 2021-01-15 DIAGNOSIS — Z720 Tobacco use: Secondary | ICD-10-CM

## 2021-01-15 DIAGNOSIS — I10 Essential (primary) hypertension: Secondary | ICD-10-CM | POA: Diagnosis not present

## 2021-01-15 DIAGNOSIS — I4891 Unspecified atrial fibrillation: Secondary | ICD-10-CM | POA: Diagnosis not present

## 2021-01-15 DIAGNOSIS — F419 Anxiety disorder, unspecified: Secondary | ICD-10-CM | POA: Diagnosis not present

## 2021-01-15 DIAGNOSIS — F1320 Sedative, hypnotic or anxiolytic dependence, uncomplicated: Secondary | ICD-10-CM | POA: Diagnosis not present

## 2021-01-15 DIAGNOSIS — I350 Nonrheumatic aortic (valve) stenosis: Secondary | ICD-10-CM | POA: Diagnosis not present

## 2021-01-15 DIAGNOSIS — J439 Emphysema, unspecified: Secondary | ICD-10-CM | POA: Diagnosis not present

## 2021-01-15 DIAGNOSIS — Z299 Encounter for prophylactic measures, unspecified: Secondary | ICD-10-CM | POA: Diagnosis not present

## 2021-01-15 DIAGNOSIS — I5032 Chronic diastolic (congestive) heart failure: Secondary | ICD-10-CM

## 2021-01-15 NOTE — Patient Instructions (Signed)
Medication Instructions:  STOP Plavix on 02/11/21   Labwork: CMET and Fasting Lipids   Testing/Procedures: None today   Follow-Up: 6 months Dr.Branch  Any Other Special Instructions Will Be Listed Below (If Applicable).  If you need a refill on your cardiac medications before your next appointment, please call your pharmacy.

## 2021-01-16 ENCOUNTER — Encounter (HOSPITAL_COMMUNITY): Payer: Medicare HMO

## 2021-01-19 ENCOUNTER — Other Ambulatory Visit: Payer: Self-pay | Admitting: *Deleted

## 2021-01-19 ENCOUNTER — Encounter (HOSPITAL_COMMUNITY): Payer: Medicare HMO

## 2021-01-19 DIAGNOSIS — Z85118 Personal history of other malignant neoplasm of bronchus and lung: Secondary | ICD-10-CM

## 2021-01-21 ENCOUNTER — Encounter (HOSPITAL_COMMUNITY): Payer: Medicare HMO

## 2021-01-23 ENCOUNTER — Encounter (HOSPITAL_COMMUNITY): Payer: Medicare HMO

## 2021-02-13 ENCOUNTER — Other Ambulatory Visit: Payer: Self-pay | Admitting: Student

## 2021-02-13 DIAGNOSIS — J069 Acute upper respiratory infection, unspecified: Secondary | ICD-10-CM | POA: Diagnosis not present

## 2021-02-13 DIAGNOSIS — Z299 Encounter for prophylactic measures, unspecified: Secondary | ICD-10-CM | POA: Diagnosis not present

## 2021-02-13 DIAGNOSIS — Z6828 Body mass index (BMI) 28.0-28.9, adult: Secondary | ICD-10-CM | POA: Diagnosis not present

## 2021-02-13 DIAGNOSIS — R5383 Other fatigue: Secondary | ICD-10-CM | POA: Diagnosis not present

## 2021-02-13 DIAGNOSIS — J441 Chronic obstructive pulmonary disease with (acute) exacerbation: Secondary | ICD-10-CM | POA: Diagnosis not present

## 2021-03-06 ENCOUNTER — Ambulatory Visit: Payer: Medicare HMO | Admitting: Thoracic Surgery (Cardiothoracic Vascular Surgery)

## 2021-03-06 ENCOUNTER — Other Ambulatory Visit: Payer: Medicare HMO

## 2021-03-06 ENCOUNTER — Other Ambulatory Visit: Payer: Self-pay

## 2021-03-06 ENCOUNTER — Ambulatory Visit
Admission: RE | Admit: 2021-03-06 | Discharge: 2021-03-06 | Disposition: A | Discharge status: Home / Self Care | Payer: Medicare HMO | Source: Ambulatory Visit | Attending: Thoracic Surgery (Cardiothoracic Vascular Surgery) | Admitting: Thoracic Surgery (Cardiothoracic Vascular Surgery)

## 2021-03-06 DIAGNOSIS — J449 Chronic obstructive pulmonary disease, unspecified: Secondary | ICD-10-CM | POA: Diagnosis not present

## 2021-03-06 DIAGNOSIS — Z902 Acquired absence of lung [part of]: Secondary | ICD-10-CM

## 2021-03-06 DIAGNOSIS — Z85118 Personal history of other malignant neoplasm of bronchus and lung: Secondary | ICD-10-CM

## 2021-03-06 DIAGNOSIS — R918 Other nonspecific abnormal finding of lung field: Secondary | ICD-10-CM | POA: Diagnosis not present

## 2021-03-06 DIAGNOSIS — J432 Centrilobular emphysema: Secondary | ICD-10-CM | POA: Diagnosis not present

## 2021-03-06 DIAGNOSIS — J439 Emphysema, unspecified: Secondary | ICD-10-CM | POA: Diagnosis not present

## 2021-03-06 NOTE — Progress Notes (Signed)
°   °  [address]       [place] [address]             [phone]       Patient: Home Provider: Office Consent for Telemedicine visit obtained.  Todays visit was completed via a real-time telehealth (see specific modality noted below). The patient/authorized person provided oral consent at the time of the visit to engage in a telemedicine encounter with the present provider at [facility]. The patient/authorized person was informed of the potential benefits, limitations, and risks of telemedicine. The patient/authorized person expressed understanding that the laws that protect confidentiality also apply to telemedicine. The patient/authorized person acknowledged understanding that telemedicine does not provide emergency services and that he or she would need to call 911 or proceed to the nearest hospital for help if such a need arose.   Total time spent in the clinical discussion 10 minutes.  Telehealth Modality: Phone visit (audio only)  I had a telephone visit with Emily Rocha.  She is status post right upper lobectomy for T1 cN0 M0 stage I squamous cell cancer.  Original surgery was in September 2020.  She also has a history of a carotid subclavian bypass in 2019, coronary artery bypass grafting in 2018, carotid endarterectomy 18, and recent history of a TAVR in 2022.  She also has a hiatal hernia.  We reviewed the results of her cross-sectional imaging in regards to her lung cancer surveillance.  There were no new nodules.  She has gone back to smoking and understands importance of quitting.  She is going to try using the nicotine patch.  She will follow-up virtually in 1 year with another CT chest.

## 2021-03-19 DIAGNOSIS — J440 Chronic obstructive pulmonary disease with acute lower respiratory infection: Secondary | ICD-10-CM | POA: Diagnosis not present

## 2021-03-19 DIAGNOSIS — J209 Acute bronchitis, unspecified: Secondary | ICD-10-CM | POA: Diagnosis not present

## 2021-03-19 DIAGNOSIS — Z299 Encounter for prophylactic measures, unspecified: Secondary | ICD-10-CM | POA: Diagnosis not present

## 2021-03-19 DIAGNOSIS — I700 Atherosclerosis of aorta: Secondary | ICD-10-CM | POA: Diagnosis not present

## 2021-03-19 DIAGNOSIS — I4891 Unspecified atrial fibrillation: Secondary | ICD-10-CM | POA: Diagnosis not present

## 2021-03-23 DIAGNOSIS — Z87891 Personal history of nicotine dependence: Secondary | ICD-10-CM | POA: Diagnosis not present

## 2021-03-23 DIAGNOSIS — J440 Chronic obstructive pulmonary disease with acute lower respiratory infection: Secondary | ICD-10-CM | POA: Diagnosis not present

## 2021-03-23 DIAGNOSIS — J209 Acute bronchitis, unspecified: Secondary | ICD-10-CM | POA: Diagnosis not present

## 2021-03-23 DIAGNOSIS — J9611 Chronic respiratory failure with hypoxia: Secondary | ICD-10-CM | POA: Diagnosis not present

## 2021-03-23 DIAGNOSIS — J441 Chronic obstructive pulmonary disease with (acute) exacerbation: Secondary | ICD-10-CM | POA: Diagnosis not present

## 2021-03-23 DIAGNOSIS — Z299 Encounter for prophylactic measures, unspecified: Secondary | ICD-10-CM | POA: Diagnosis not present

## 2021-03-23 DIAGNOSIS — J439 Emphysema, unspecified: Secondary | ICD-10-CM | POA: Diagnosis not present

## 2021-04-14 ENCOUNTER — Other Ambulatory Visit: Payer: Self-pay | Admitting: Physician Assistant

## 2021-04-14 DIAGNOSIS — Z952 Presence of prosthetic heart valve: Secondary | ICD-10-CM

## 2021-04-16 DIAGNOSIS — Z299 Encounter for prophylactic measures, unspecified: Secondary | ICD-10-CM | POA: Diagnosis not present

## 2021-04-16 DIAGNOSIS — I25119 Atherosclerotic heart disease of native coronary artery with unspecified angina pectoris: Secondary | ICD-10-CM | POA: Diagnosis not present

## 2021-04-16 DIAGNOSIS — F419 Anxiety disorder, unspecified: Secondary | ICD-10-CM | POA: Diagnosis not present

## 2021-04-16 DIAGNOSIS — I4891 Unspecified atrial fibrillation: Secondary | ICD-10-CM | POA: Diagnosis not present

## 2021-04-16 DIAGNOSIS — F17210 Nicotine dependence, cigarettes, uncomplicated: Secondary | ICD-10-CM | POA: Diagnosis not present

## 2021-04-16 DIAGNOSIS — F1320 Sedative, hypnotic or anxiolytic dependence, uncomplicated: Secondary | ICD-10-CM | POA: Diagnosis not present

## 2021-04-16 DIAGNOSIS — I700 Atherosclerosis of aorta: Secondary | ICD-10-CM | POA: Diagnosis not present

## 2021-05-13 DIAGNOSIS — J069 Acute upper respiratory infection, unspecified: Secondary | ICD-10-CM | POA: Diagnosis not present

## 2021-05-13 DIAGNOSIS — R079 Chest pain, unspecified: Secondary | ICD-10-CM | POA: Diagnosis not present

## 2021-05-13 DIAGNOSIS — Z299 Encounter for prophylactic measures, unspecified: Secondary | ICD-10-CM | POA: Diagnosis not present

## 2021-05-13 DIAGNOSIS — R5383 Other fatigue: Secondary | ICD-10-CM | POA: Diagnosis not present

## 2021-06-11 ENCOUNTER — Other Ambulatory Visit (HOSPITAL_COMMUNITY)
Admission: RE | Admit: 2021-06-11 | Discharge: 2021-06-11 | Disposition: A | Discharge status: Home / Self Care | Payer: Medicare HMO | Source: Ambulatory Visit | Attending: Cardiology | Admitting: Cardiology

## 2021-06-11 DIAGNOSIS — I2510 Atherosclerotic heart disease of native coronary artery without angina pectoris: Secondary | ICD-10-CM | POA: Insufficient documentation

## 2021-06-11 LAB — COMPREHENSIVE METABOLIC PANEL
ALT: 15 U/L (ref 0–44)
AST: 20 U/L (ref 15–41)
Albumin: 4 g/dL (ref 3.5–5.0)
Alkaline Phosphatase: 74 U/L (ref 38–126)
Anion gap: 9 (ref 5–15)
BUN: 18 mg/dL (ref 8–23)
CO2: 33 mmol/L — ABNORMAL HIGH (ref 22–32)
Calcium: 9.1 mg/dL (ref 8.9–10.3)
Chloride: 96 mmol/L — ABNORMAL LOW (ref 98–111)
Creatinine, Ser: 0.65 mg/dL (ref 0.44–1.00)
GFR, Estimated: >60 mL/min (ref >60)
Glucose, Bld: 97 mg/dL (ref 70–99)
Potassium: 4.1 mmol/L (ref 3.5–5.1)
Sodium: 138 mmol/L (ref 135–145)
Total Bilirubin: 1.7 mg/dL — ABNORMAL HIGH (ref 0.3–1.2)
Total Protein: 6.8 g/dL (ref 6.5–8.1)

## 2021-06-11 LAB — LIPID PANEL
Cholesterol: 113 mg/dL (ref 0–200)
HDL: 48 mg/dL (ref >40)
LDL Cholesterol: 37 mg/dL (ref 0–99)
Total CHOL/HDL Ratio: 2.4 RATIO
Triglycerides: 139 mg/dL (ref <150)
VLDL: 28 mg/dL (ref 0–40)

## 2021-06-21 NOTE — Progress Notes (Addendum)
?[facility]   ?[place] ?                                    ?Cardiology Office Note:   ? ?Date:  06/24/2021  ? ?ID:  Emily Rocha, DOB [birth year], MRN [id number] ? ?PCP:  Glenda Chroman, MD  ?[facility] Cardiologist:  Carlyle Dolly, MD  / Dr. Angelena Form & Dr. Cyndia Bent (TAVR) ?[place] Electrophysiologist:  None  ? ?Referring MD: Glenda Chroman, MD  ? ?1 year s/p TAVR ? ?History of Present Illness:   ? ?Emily Rocha is a 66 y.o. female with a hx of CAD s/p CABG (2018) and subseqeunt PCI, lung adenocarcinoma s/p right upper lobectomy in 2020, COPD, prior stroke, PVD, carotid artery disease s/p R CEA (2016) and redo sternotomy for right subclavian/right carotid bypass (2019) with superficial sternal wound infection requiring wound debridement and wound VAC therapy, GERD and severe aortic stenosis s/p TAVR (08/12/20) who presents to clinic for follow up. ? ?She has a history of coronary disease status post CABG x3 by Dr. Lucianne Lei trigt in 2018, redo sternotomy with aorto to right common carotid artery and right subclavian artery bypasses by Dr. Lucianne Lei trigt and Dr. Oneida Alar on 12/26/2017 for high-grade stenosis of the innominate artery, superficial sternal wound infection requiring wound debridement and wound VAC therapy on 01/06/2018 and 01/13/2018.  She was subsequently diagnosed with a right upper lobe pulmonary nodule and underwent VATS right upper lobectomy by Dr. Kipp Brood on 10/26/2018.  She also has a history of prior smoking and COPD, prior stroke, and severe aortic stenosis.  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  She now presents with progressive exertional dyspnea and fatigue.  Her most recent echocardiogram on 04/28/2020 showed a trileaflet, thickened and calcified aortic valve with restricted leaflet mobility.  The mean gradient was 43 mmHg and the peak 68 mmHg.  Dimensionless index was  0.23 consistent with severe aortic stenosis.  Left ventricular systolic function was low normal with ejection fraction of 55 to 60%.  Her gradients had increased compared to her previous echo in September 2021. Cardiac catheterization on 06/04/2020 showed severe left main stenosis. The diagonal and LAD filled from a patent vein graft to the diagonal branch.  The LIMA graft was atraumatic. The chronically occluded left circumflex filled from a patent vein graft.  The RCA had a severe heavily calcific stenosis.  The mean gradient across aortic valve was measured at 40.3 mmHg with a peak to peak gradient of 55 mmHg and aortic valve area of 0.75 cm?.  She subsequently underwent staged PCI of the RCA with orbital atherectomy and DES on 06/12/2020 and said that she is felt much better since then with a decrease in her shortness of breath and fatigue. ?  ?She was evaluated by the multidisciplinary valve team and underwent successful TAVR with a 23 mm Edwards Sapien 3 Ultra 23 THV via the TF approach on 08/12/20. Post operative echo showed EF 65%, normally functioning TAVR with a mean gradient of 8.1 mmHg and no PVL. She was continued on aspirin and plavix given recent PCI. ? ?Was seen by PCP yesterday and had some hypoxia and was treated with shot of steroids and oral prednisone and abx.  ? ?Today the patient presents to clinic for follow  up. 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 She is feeling better since being treated for COPD exacerbation. No CP. No LE edema, orthopnea or PND. No dizziness or syncope. No blood in stool or urine. No palpitations.  ? ? ? ?Past Medical History:  ?Diagnosis Date  ? Anxiety   ? Asthma   ? COPD (chronic obstructive pulmonary disease) ([place])   ? Coronary artery disease   ? a. s/p NSTEMI in 11/2016 and required CABG with LIMA-LAD, SVG-LCx and SVG-D1 and complicated by cardiogenic shock b. cath in 05/2020 showing severe RCA stenosis and treated with orbital atherectomy and stent placement  ?  GERD (gastroesophageal reflux disease)   ? History of CVA (cerebrovascular accident)   ? History of lung cancer 2020  ? Peripheral vascular disease ([place])   ? S/P TAVR (transcatheter aortic valve replacement) 08/12/2020  ? s/p TAVR with a 23 mm Edwards S3U via the TF approach by Dr. Angelena Form & Dr. Cyndia Bent  ? Severe aortic stenosis   ? ? ?Past Surgical History:  ?Procedure Laterality Date  ? AORTIC ARCH ANGIOGRAPHY N/A 11/04/2017  ? Procedure: AORTIC ARCH ANGIOGRAPHY;  Surgeon: Elam Dutch, MD;  Location: [place] [facility];  Service: Cardiovascular;  Laterality: N/A;  ? APPLICATION OF WOUND VAC N/A 01/06/2018  ? Procedure: APPLICATION OF WOUND VAC;  Surgeon: Prescott Gum, Collier Salina, MD;  Location: [place];  Service: Thoracic;  Laterality: N/A;  ? APPLICATION OF WOUND VAC N/A 01/13/2018  ? Procedure: WOUND VAC CHANGE;  Surgeon: Ivin Poot, MD;  Location: [place];  Service: Thoracic;  Laterality: N/A;  ? CARDIAC CATHETERIZATION    ? CAROTID-SUBCLAVIAN BYPASS GRAFT Right 12/26/2017  ? Procedure: AORTIC TO RIGHT COMMON CAROTID AND RIGHT SUBCLAVIAN  ARTERY  BYPASS;  Surgeon: Elam Dutch, MD;  Location: [place];  Service: Vascular;  Laterality: Right;  ? CHOLECYSTECTOMY    ? CLOSURE OF DIAPHRAGM  12/26/2017  ? Procedure: REPAIR OF DIAPHRAGM;  Surgeon: Ivin Poot, MD;  Location: [place];  Service: Thoracic;;  ? COLONOSCOPY N/A 10/03/2014  ? Procedure: COLONOSCOPY;  Surgeon: Rogene Houston, MD;  [facility];  Service: Endoscopy;  Laterality: N/A;  730  ? CORONARY ARTERY BYPASS GRAFT N/A 12/14/2016  ? Procedure: CORONARY ARTERY BYPASS GRAFTING (CABG) x three , using left internal mammary artery and right leg greater saphenous vein harvested endoscopically;  Surgeon: Ivin Poot, MD;  Location: [place];  Service: Open Heart Surgery;  Laterality: N/A;  ? CORONARY ATHERECTOMY N/A 06/11/2020  ? Procedure: CORONARY ATHERECTOMY;  Surgeon: Burnell Blanks, MD;  Location: [place] [facility];  Service:  Cardiovascular;  Laterality: N/A;  ? CORONARY STENT INTERVENTION N/A 06/11/2020  ? Procedure: CORONARY STENT INTERVENTION;  Surgeon: Burnell Blanks, MD;  Location: [place] [facility];  Service: Cardiovascular;  Laterality: N/A;  ? ENDARTERECTOMY Right 12/06/2014  ? Procedure: ENDARTERECTOMY CAROTid;  Surgeon: Elam Dutch, MD;  Location: [place];  Service: Vascular;  Laterality: Right;  ? ESOPHAGOGASTRODUODENOSCOPY N/A 12/03/2016  ? Procedure: ESOPHAGOGASTRODUODENOSCOPY (EGD);  Surgeon: Rogene Houston, MD;  [facility];  Service: Endoscopy;  Laterality: N/A;  7:30  ? INTRAVASCULAR IMAGING/OCT N/A 06/11/2020  ? Procedure: INTRAVASCULAR IMAGING/OCT;  Surgeon: Burnell Blanks, MD;  Location: [place] [facility];  Service: Cardiovascular;  Laterality: N/A;  ? INTRAVASCULAR PRESSURE WIRE/FFR STUDY N/A 06/04/2020  ? Procedure: INTRAVASCULAR PRESSURE WIRE/FFR STUDY;  Surgeon: Burnell Blanks, MD;  Location: [place] [facility];  Service: Cardiovascular;  Laterality: N/A;  ? LEFT HEART CATH AND CORONARY ANGIOGRAPHY N/A 12/14/2016  ? Procedure: LEFT HEART CATH AND CORONARY ANGIOGRAPHY;  Surgeon: Burnell Blanks, MD;  Location: [place] [facility];  Service: Cardiovascular;  Laterality: N/A;  ? LOBECTOMY Right 10/26/2018  ? Procedure: RIGHT UPPER LOBECTOMY;  Surgeon: Lajuana Matte, MD;  Location: [place];  Service: Thoracic;  Laterality: Right;  ? PERIPHERAL VASCULAR CATHETERIZATION N/A 12/05/2014  ? Procedure:  Carotid Arch Angiography;  Surgeon: Conrad Swink, MD;  Location: [place] [facility];  Service: Cardiovascular;  Laterality: N/A;  ? RIGHT/LEFT HEART CATH AND CORONARY/GRAFT ANGIOGRAPHY N/A 06/04/2020  ? Procedure: RIGHT/LEFT HEART CATH AND CORONARY/GRAFT ANGIOGRAPHY;  Surgeon: Burnell Blanks, MD;  Location: [place] [facility];  Service: Cardiovascular;  Laterality: N/A;  ? STERNAL WOUND DEBRIDEMENT N/A 01/06/2018  ? Procedure: STERNAL WOUND DEBRIDEMENT;  Surgeon:  Ivin Poot, MD;  Location: [place];  Service: Thoracic;  Laterality: N/A;  ? STERNOTOMY N/A 12/26/2017  ? Procedure: REDO STERNOTOMY;  Surgeon: Ivin Poot, MD;  Location: [place];  Service: [facility]

## 2021-06-23 DIAGNOSIS — J439 Emphysema, unspecified: Secondary | ICD-10-CM | POA: Diagnosis not present

## 2021-06-23 DIAGNOSIS — Z299 Encounter for prophylactic measures, unspecified: Secondary | ICD-10-CM | POA: Diagnosis not present

## 2021-06-23 DIAGNOSIS — F17210 Nicotine dependence, cigarettes, uncomplicated: Secondary | ICD-10-CM | POA: Diagnosis not present

## 2021-06-24 ENCOUNTER — Ambulatory Visit: Payer: Medicare HMO | Admitting: Physician Assistant

## 2021-06-24 ENCOUNTER — Ambulatory Visit (HOSPITAL_COMMUNITY): Payer: Medicare HMO | Attending: Cardiology

## 2021-06-24 VITALS — BP 118/70 | HR 76 | Ht 64.0 in | Wt 154.0 lb

## 2021-06-24 DIAGNOSIS — I2510 Atherosclerotic heart disease of native coronary artery without angina pectoris: Secondary | ICD-10-CM | POA: Diagnosis not present

## 2021-06-24 DIAGNOSIS — I5032 Chronic diastolic (congestive) heart failure: Secondary | ICD-10-CM | POA: Diagnosis not present

## 2021-06-24 DIAGNOSIS — I10 Essential (primary) hypertension: Secondary | ICD-10-CM | POA: Diagnosis not present

## 2021-06-24 DIAGNOSIS — Z952 Presence of prosthetic heart valve: Secondary | ICD-10-CM

## 2021-06-24 DIAGNOSIS — Z720 Tobacco use: Secondary | ICD-10-CM

## 2021-06-24 LAB — ECHOCARDIOGRAM COMPLETE
AR max vel: 1.72 cm2
AV Area VTI: 1.59 cm2
AV Area mean vel: 1.34 cm2
AV Mean grad: 10.3 mmHg
AV Peak grad: 18.2 mmHg
Ao pk vel: 2.13 m/s
Area-P 1/2: 4.26 cm2
P 1/2 time: 280 msec
S' Lateral: 2 cm

## 2021-06-24 NOTE — Patient Instructions (Signed)

## 2021-06-29 ENCOUNTER — Telehealth: Payer: Self-pay | Admitting: Cardiology

## 2021-06-29 NOTE — Telephone Encounter (Signed)
I spoke with patient and offered her an apt with C.Walker,NP on 07/10/21 at our [place] facility but she declined.She wants to keep her 07/22/21 apt with Dr.Branch. ? ? ?She states she typically has swelling in her abdomen when she has extra weight but now states her ankles swell during the day and she she cannot walk more then 50 feet w/o getting SOB. ? ?Her weight today is 155 lbs. She just came back from [facility] yesterday and states she only dined out once.I encouraged her to elevate legs while sitting and to wear compression stockings. ? ?She also states this issue has been going on for "quite a while" and she is seeing Dr.Vyas for COPD. ? ?She confirmed she is taking torsemide 40 mg daily except Sundays. ? ? ? ? ?I will message Dr.Branch for dispo. ? ? ?

## 2021-06-29 NOTE — Telephone Encounter (Signed)
I spoke with patient and she agrees to take torsemide 60 mg today and tomorrow and update [place] on Wednesday, 5/17 ?

## 2021-06-29 NOTE — Telephone Encounter (Signed)
Looks like had seen [facility] few days ago and had been doing well. Weight of 155 lbs is the baseline we have had last few visits. Could take torsemide 60mg  today and tomorrow and update [place] Wed ? ?Zandra Abts MD ?

## 2021-06-29 NOTE — Telephone Encounter (Signed)
Pt c/o swelling: STAT is pt has developed SOB within 24 hours ? ?If swelling, where is the swelling located? feet and swelling ? ?How much weight have you gained and in what time span?  ? ?Have you gained 3 pounds in a day or 5 pounds in a week?    ? ?Do you have a log of your daily weights (if so, list)?  ? ?Are you currently taking a fluid pill? yes ? ?Are you currently SOB? Not at this time, but any times she walks, she get that way, very weak ? ?Have you traveled recently? Patient wants to be seen asap ? ?

## 2021-07-02 DIAGNOSIS — J441 Chronic obstructive pulmonary disease with (acute) exacerbation: Secondary | ICD-10-CM | POA: Diagnosis not present

## 2021-07-02 DIAGNOSIS — Z299 Encounter for prophylactic measures, unspecified: Secondary | ICD-10-CM | POA: Diagnosis not present

## 2021-07-02 DIAGNOSIS — I4891 Unspecified atrial fibrillation: Secondary | ICD-10-CM | POA: Diagnosis not present

## 2021-07-02 DIAGNOSIS — I700 Atherosclerosis of aorta: Secondary | ICD-10-CM | POA: Diagnosis not present

## 2021-07-02 DIAGNOSIS — R059 Cough, unspecified: Secondary | ICD-10-CM | POA: Diagnosis not present

## 2021-07-02 DIAGNOSIS — J439 Emphysema, unspecified: Secondary | ICD-10-CM | POA: Diagnosis not present

## 2021-07-02 DIAGNOSIS — J449 Chronic obstructive pulmonary disease, unspecified: Secondary | ICD-10-CM | POA: Diagnosis not present

## 2021-07-03 DIAGNOSIS — Z713 Dietary counseling and surveillance: Secondary | ICD-10-CM | POA: Diagnosis not present

## 2021-07-03 DIAGNOSIS — J441 Chronic obstructive pulmonary disease with (acute) exacerbation: Secondary | ICD-10-CM | POA: Diagnosis not present

## 2021-07-03 DIAGNOSIS — Z299 Encounter for prophylactic measures, unspecified: Secondary | ICD-10-CM | POA: Diagnosis not present

## 2021-07-03 DIAGNOSIS — Z6827 Body mass index (BMI) 27.0-27.9, adult: Secondary | ICD-10-CM | POA: Diagnosis not present

## 2021-07-06 DIAGNOSIS — J449 Chronic obstructive pulmonary disease, unspecified: Secondary | ICD-10-CM | POA: Diagnosis not present

## 2021-07-06 DIAGNOSIS — Z299 Encounter for prophylactic measures, unspecified: Secondary | ICD-10-CM | POA: Diagnosis not present

## 2021-07-06 DIAGNOSIS — J441 Chronic obstructive pulmonary disease with (acute) exacerbation: Secondary | ICD-10-CM | POA: Diagnosis not present

## 2021-07-06 DIAGNOSIS — Z87891 Personal history of nicotine dependence: Secondary | ICD-10-CM | POA: Diagnosis not present

## 2021-07-16 DIAGNOSIS — I4891 Unspecified atrial fibrillation: Secondary | ICD-10-CM | POA: Diagnosis not present

## 2021-07-16 DIAGNOSIS — F419 Anxiety disorder, unspecified: Secondary | ICD-10-CM | POA: Diagnosis not present

## 2021-07-16 DIAGNOSIS — Z299 Encounter for prophylactic measures, unspecified: Secondary | ICD-10-CM | POA: Diagnosis not present

## 2021-07-16 DIAGNOSIS — I700 Atherosclerosis of aorta: Secondary | ICD-10-CM | POA: Diagnosis not present

## 2021-07-16 DIAGNOSIS — J439 Emphysema, unspecified: Secondary | ICD-10-CM | POA: Diagnosis not present

## 2021-07-22 ENCOUNTER — Ambulatory Visit: Payer: Medicare HMO | Admitting: Cardiology

## 2021-07-22 ENCOUNTER — Encounter: Payer: Self-pay | Admitting: Cardiology

## 2021-07-22 VITALS — BP 118/60 | HR 65 | Ht 65.0 in | Wt 147.2 lb

## 2021-07-22 DIAGNOSIS — I5032 Chronic diastolic (congestive) heart failure: Secondary | ICD-10-CM | POA: Diagnosis not present

## 2021-07-22 DIAGNOSIS — Z952 Presence of prosthetic heart valve: Secondary | ICD-10-CM | POA: Diagnosis not present

## 2021-07-22 DIAGNOSIS — E782 Mixed hyperlipidemia: Secondary | ICD-10-CM | POA: Diagnosis not present

## 2021-07-22 DIAGNOSIS — I2510 Atherosclerotic heart disease of native coronary artery without angina pectoris: Secondary | ICD-10-CM

## 2021-07-22 NOTE — Progress Notes (Signed)
Clinical Summary Emily Rocha is a 66 y.o.female seen today for follow up of the following medical problems.   1. CAD - s/p CABG (LIMA-LAD, SVG-LCX, SVG-Diag) after presenting 11/2016 with NSTEMI. Presentation complicated by cardiogenic shock - echo 12/2016 LVEF 45-50% - developed wound infection followed by CT surgery that has now healed.  - currentlty in cardiac rehab - plavix stopped previously by CT surgery      11/2017 echo LVEF normal LVEF, grade I DDX, mild AS, mild AI,   10/2018 nuclear stress no ischemia     05/2020 cath as reported below. Received PCI to prox to mid RCA No chest pains.      2. Carotid stenosis - history of prior symptomatic carotid stenosis with TIAs.  - history of prior right CEA - found to have high grade stenosis right inonominate artery, she is s/p right subclavian and right common carotid bypass 12/2017 - followed by vascular  2022 Carotid US mild bilateral disease.  - would repeat US in 2024     3. Chronic diastolic HF - recent weight gain 06/2021, took extra torsemide - had been prednisone.  - swelling has resolved with extra torsemide.  - home weight 150 lbs.      4. Aortic stenosis -s/p TAVR 07/2020 - 06/2021 echo LVEF 65%, no WMAs, grade II dd, normal TAVR valve   5. COPD - followed by pulmonary - on home O2 - has appt tomorrow  - has been on abx/steroids per pcp     6. Lung cancer -s/p lung wedge resection      7. Syncope She was again admitted to [facility] from 11/12/2019 - 11/13/2019 for evaluation of multiple syncopal episodes and she was found on the ground by EMS. Imaging during admission did show a subdural hematoma and Neurosurgery recommended discontinuing Eliquis and nonoperative management. A repeat echocardiogram was performed and showed a preserved EF of 60 to 65%. This was read as showing moderate to severe aortic stenosis and Dr. Harl Bowie felt it was more consistent with a moderate range and likely did not play a  role in her syncopal episodes. She did have soft blood pressures during admission and was recommended to remain off Toprol-XL and Imdur until outpatient follow-up. Was also recommended to have an outpatient monitor so to assess for any significant arrhythmias.   - overall benign event monitor - no recurrent  episodes     Past Medical History:  Diagnosis Date   Anxiety    Asthma    COPD (chronic obstructive pulmonary disease) ([place])    Coronary artery disease    a. s/p NSTEMI in 11/2016 and required CABG with LIMA-LAD, SVG-LCx and SVG-D1 and complicated by cardiogenic shock b. cath in 05/2020 showing severe RCA stenosis and treated with orbital atherectomy and stent placement   GERD (gastroesophageal reflux disease)    History of CVA (cerebrovascular accident)    History of lung cancer 2020   Peripheral vascular disease ([place])    S/P TAVR (transcatheter aortic valve replacement) 08/12/2020   s/p TAVR with a 23 mm Edwards S3U via the TF approach by Dr. Angelena Form & Dr. Cyndia Bent   Severe aortic stenosis      No Known Allergies   Current Outpatient Medications  Medication Sig Dispense Refill   albuterol (PROVENTIL) (2.5 MG/3ML) 0.083% nebulizer solution Take 2.5 mg by nebulization every 4 (four) hours as needed for wheezing or shortness of breath.     albuterol (VENTOLIN HFA) 108 (90 Base)  MCG/ACT inhaler Inhale 1-2 puffs into the lungs every 6 (six) hours as needed for wheezing or shortness of breath.     ALPRAZolam (XANAX) 0.5 MG tablet Take 0.5 mg by mouth 3 (three) times daily.     Ascorbic Acid (VITAMIN C GUMMIE PO) Chew 500 mg by mouth daily.     aspirin EC 81 MG tablet Take 1 tablet (81 mg total) by mouth daily. Swallow whole. 90 tablet 3   atorvastatin (LIPITOR) 40 MG tablet Take 1 tablet (40 mg total) daily by mouth. 30 tablet 1   cholecalciferol (VITAMIN D3) 25 MCG (1000 UNIT) tablet Take 1,000 Units by mouth in the morning.     diphenhydramine-acetaminophen (TYLENOL PM) 25-500 MG  TABS tablet Take 2 tablets by mouth at bedtime.     metoprolol succinate (TOPROL-XL) 50 MG 24 hr tablet TAKE ONE TABLET (50MG  TOTAL) BY MOUTH DAILY WITH OR IMMEDIATELY FOLLOWING A MEAL 90 tablet 1   Multiple Vitamin (MULTIVITAMIN WITH MINERALS) TABS tablet Take 1 tablet by mouth daily.     pantoprazole (PROTONIX) 40 MG tablet Take 40 mg by mouth in the morning.     potassium chloride SA (KLOR-CON) 20 MEQ tablet Take 20 mEq by mouth in the morning.     Tiotropium Bromide-Olodaterol (STIOLTO RESPIMAT) 2.5-2.5 MCG/ACT AERS Inhale 2 puffs into the lungs daily. 1 each 4   torsemide (DEMADEX) 20 MG tablet Take 40 mg by mouth See admin instructions. Take 2 tablets (40 mg) by mouth scheduled on Mondays, Tuesdays, Wednesdays, Thursdays, Fridays & Saturdays. (Hold dose on Sundays)     No current facility-administered medications for this visit.     Past Surgical History:  Procedure Laterality Date   AORTIC ARCH ANGIOGRAPHY N/A 11/04/2017   Procedure: AORTIC ARCH ANGIOGRAPHY;  Surgeon: Elam Dutch, MD;  Location: [place] [facility];  Service: Cardiovascular;  Laterality: N/A;   APPLICATION OF WOUND VAC N/A 01/06/2018   Procedure: APPLICATION OF WOUND VAC;  Surgeon: Ivin Poot, MD;  Location: [place];  Service: Thoracic;  Laterality: N/A;   APPLICATION OF WOUND VAC N/A 01/13/2018   Procedure: WOUND VAC CHANGE;  Surgeon: Ivin Poot, MD;  Location: [place];  Service: Thoracic;  Laterality: N/A;   CARDIAC CATHETERIZATION     CAROTID-SUBCLAVIAN BYPASS GRAFT Right 12/26/2017   Procedure: AORTIC TO RIGHT COMMON CAROTID AND RIGHT SUBCLAVIAN  ARTERY  BYPASS;  Surgeon: Elam Dutch, MD;  Location: [place];  Service: Vascular;  Laterality: Right;   CHOLECYSTECTOMY     CLOSURE OF DIAPHRAGM  12/26/2017   Procedure: REPAIR OF DIAPHRAGM;  Surgeon: Ivin Poot, MD;  Location: [place];  Service: Thoracic;;   COLONOSCOPY N/A 10/03/2014   Procedure: COLONOSCOPY;  Surgeon: Rogene Houston, MD;   [facility];  Service: Endoscopy;  Laterality: N/A;  730   CORONARY ARTERY BYPASS GRAFT N/A 12/14/2016   Procedure: CORONARY ARTERY BYPASS GRAFTING (CABG) x three , using left internal mammary artery and right leg greater saphenous vein harvested endoscopically;  Surgeon: Ivin Poot, MD;  Location: [place];  Service: Open Heart Surgery;  Laterality: N/A;   CORONARY ATHERECTOMY N/A 06/11/2020   Procedure: CORONARY ATHERECTOMY;  Surgeon: Burnell Blanks, MD;  Location: [place] [facility];  Service: Cardiovascular;  Laterality: N/A;   CORONARY STENT INTERVENTION N/A 06/11/2020   Procedure: CORONARY STENT INTERVENTION;  Surgeon: Burnell Blanks, MD;  Location: [place] [facility];  Service: Cardiovascular;  Laterality: N/A;   ENDARTERECTOMY  Right 12/06/2014   Procedure: ENDARTERECTOMY CAROTid;  Surgeon: Elam Dutch, MD;  Location: [facility];  Service: Vascular;  Laterality: Right;   ESOPHAGOGASTRODUODENOSCOPY N/A 12/03/2016   Procedure: ESOPHAGOGASTRODUODENOSCOPY (EGD);  Surgeon: Rogene Houston, MD;  [facility];  Service: Endoscopy;  Laterality: N/A;  7:30   INTRAVASCULAR IMAGING/OCT N/A 06/11/2020   Procedure: INTRAVASCULAR IMAGING/OCT;  Surgeon: Burnell Blanks, MD;  Location: [place] [facility];  Service: Cardiovascular;  Laterality: N/A;   INTRAVASCULAR PRESSURE WIRE/FFR STUDY N/A 06/04/2020   Procedure: INTRAVASCULAR PRESSURE WIRE/FFR STUDY;  Surgeon: Burnell Blanks, MD;  Location: [place] [facility];  Service: Cardiovascular;  Laterality: N/A;   LEFT HEART CATH AND CORONARY ANGIOGRAPHY N/A 12/14/2016   Procedure: LEFT HEART CATH AND CORONARY ANGIOGRAPHY;  Surgeon: Burnell Blanks, MD;  Location: [place] [facility];  Service: Cardiovascular;  Laterality: N/A;   LOBECTOMY Right 10/26/2018   Procedure: RIGHT UPPER LOBECTOMY;  Surgeon: Lajuana Matte, MD;  Location: [place];  Service: Thoracic;  Laterality: Right;   PERIPHERAL  VASCULAR CATHETERIZATION N/A 12/05/2014   Procedure:  Carotid Arch Angiography;  Surgeon: Conrad Rockland, MD;  Location: [place] [facility];  Service: Cardiovascular;  Laterality: N/A;   RIGHT/LEFT HEART CATH AND CORONARY/GRAFT ANGIOGRAPHY N/A 06/04/2020   Procedure: RIGHT/LEFT HEART CATH AND CORONARY/GRAFT ANGIOGRAPHY;  Surgeon: Burnell Blanks, MD;  Location: [place] [facility];  Service: Cardiovascular;  Laterality: N/A;   STERNAL WOUND DEBRIDEMENT N/A 01/06/2018   Procedure: STERNAL WOUND DEBRIDEMENT;  Surgeon: Ivin Poot, MD;  Location: [place];  Service: Thoracic;  Laterality: N/A;   STERNOTOMY N/A 12/26/2017   Procedure: REDO STERNOTOMY;  Surgeon: Prescott Gum, Collier Salina, MD;  Location: [place];  Service: Thoracic;  Laterality: N/A;   TEE WITHOUT CARDIOVERSION N/A 12/14/2016   Procedure: TRANSESOPHAGEAL ECHOCARDIOGRAM (TEE);  Surgeon: Prescott Gum, Collier Salina, MD;  Location: [place];  Service: Open Heart Surgery;  Laterality: N/A;   TEE WITHOUT CARDIOVERSION N/A 08/12/2020   Procedure: TRANSESOPHAGEAL ECHOCARDIOGRAM (TEE);  Surgeon: Burnell Blanks, MD;  Location: [place] [facility];  Service: Open Heart Surgery;  Laterality: N/A;   TRANSCATHETER AORTIC VALVE REPLACEMENT, TRANSFEMORAL N/A 08/12/2020   Procedure: TRANSCATHETER AORTIC VALVE REPLACEMENT, TRANSFEMORAL;  Surgeon: Burnell Blanks, MD;  Location: [place] [facility];  Service: Open Heart Surgery;  Laterality: N/A;   VIDEO ASSISTED THORACOSCOPY (VATS)/WEDGE RESECTION Right 10/26/2018   Procedure: VIDEO ASSISTED THORACOSCOPY (VATS)/WEDGE RESECTION;  Surgeon: Lajuana Matte, MD;  Location: [place];  Service: Thoracic;  Laterality: Right;   VIDEO BRONCHOSCOPY N/A 10/26/2018   Procedure: VIDEO BRONCHOSCOPY;  Surgeon: Lajuana Matte, MD;  Location: [facility];  Service: Thoracic;  Laterality: N/A;     No Known Allergies    Family History  Problem Relation Age of Onset   Heart attack Mother    Cancer Mother    Diabetes  Mother    Heart disease Mother    Heart attack Father    Heart disease Father      Social History Ms. Stevens reports that she has been smoking cigarettes. She has never used smokeless tobacco. Ms. Weiss reports no history of alcohol use.   Review of Systems CONSTITUTIONAL: No weight loss, fever, chills, weakness or fatigue.  HEENT: Eyes: No visual loss, blurred vision, double vision or yellow sclerae.No hearing loss, sneezing, congestion, runny nose or sore throat.  SKIN: No rash or itching.  CARDIOVASCULAR: per hpi RESPIRATORY: + SOB, cough GASTROINTESTINAL: No anorexia, nausea,  vomiting or diarrhea. No abdominal pain or blood.  GENITOURINARY: No burning on urination, no polyuria NEUROLOGICAL: No headache, dizziness, syncope, paralysis, ataxia, numbness or tingling in the extremities. No change in bowel or bladder control.  MUSCULOSKELETAL: No muscle, back pain, joint pain or stiffness.  LYMPHATICS: No enlarged nodes. No history of splenectomy.  PSYCHIATRIC: No history of depression or anxiety.  ENDOCRINOLOGIC: No reports of sweating, cold or heat intolerance. No polyuria or polydipsia.  Marland Kitchen   Physical Examination Today's Vitals   07/22/21 0850  BP: 118/60  Pulse: 65  SpO2: (!) 78%  Weight: 147 lb 3.2 oz (66.8 kg)  Height: 5\' 5"  (1.651 m)   Body mass index is 24.5 kg/m.  Gen: resting comfortably, no acute distress HEENT: no scleral icterus, pupils equal round and reactive, no palptable cervical adenopathy,  CV: RRR, no m/r/g no jvd Resp: bilateral wheezing GI: abdomen is soft, non-tender, non-distended, normal bowel sounds, no hepatosplenomegaly MSK: extremities are warm, no edema.  Skin: warm, no rash Neuro:  no focal deficits Psych: appropriate affect   Diagnostic Studies  06/2015 echo Study Conclusions   - Left ventricle: The cavity size was normal. Wall thickness was   increased in a pattern of mild concentric LVH, with moderate   focal basal septal  hypertrophy. Systolic function was normal. The   estimated ejection fraction was in the range of 60% to 65%. Wall   motion was normal; there were no regional wall motion   abnormalities. Doppler parameters are consistent with abnormal   left ventricular relaxation (grade 1 diastolic dysfunction).   Indeterminate filling pressures. - Aortic valve: Mildly to moderately calcified annulus. Mildly   thickened, mildly calcified leaflets. There was mild stenosis.   Peak velocity (S): 231 cm/s. Mean gradient (S): 9 mm Hg. Valve   area (VTI): 1.69 cm^2. Valve area (Vmean): 1.69 cm^2. - Mitral valve: Mildly calcified annulus. Normal thickness leaflets   .   06/2015 Stress MPI Equivocal ST segment depression, 0.5 mm in leads II, III, aVF, and V4 through V6. Resolved quickly in recovery. Intermediate risk Duke treadmill score of 1.5 based on limited exercise time and nondiagnostic ST segment change. Blood pressure demonstrated a hypertensive response to exercise. Small, mild intensity, basal septal defect that exhibits partial reversibility, suspect related to variable soft tissue attenuation rather than ischemia. This is a low risk study based on perfusion imaging. Nuclear stress EF: 75%.   11/2016 cath Mid RCA lesion, 60 %stenosed. Prox RCA lesion, 40 %stenosed. Ost RPDA to RPDA lesion, 90 %stenosed. Ost Cx to Prox Cx lesion, 100 %stenosed. Mid LAD lesion, 80 %stenosed. Ost 2nd Diag to 2nd Diag lesion, 40 %stenosed. Prox LAD to Mid LAD lesion, 40 %stenosed. Ost LM to LM lesion, 99 %stenosed.   1. Severe distal left main stenosis 2. Severe mid LAD stenosis 3. 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  4. Moderately severe, calcified stenosis of the mid RCA 5. Elevated filling pressures.    11/2016 echo Mid RCA lesion, 60 %stenosed. Prox RCA lesion, 40 %stenosed. Ost RPDA to RPDA lesion, 90 %stenosed. Ost Cx to Prox Cx lesion, 100  %stenosed. Mid LAD lesion, 80 %stenosed. Ost 2nd Diag to 2nd Diag lesion, 40 %stenosed. Prox LAD to Mid LAD lesion, 40 %stenosed. Ost LM to LM lesion, 99 %stenosed.   1. Severe distal left main stenosis 2. Severe mid LAD stenosis 3. Chronic total occlusion of the ostial Circumflex artery which fills from  right to left collaterals.  4. Moderately severe, calcified stenosis of the mid RCA 5. Elevated filling pressures.    12/2016 echo Study Conclusions   - HPI and indications: Limited for LV function & Rule out effusion. - Left ventricle: Systolic function was mildly reduced. The   estimated ejection fraction was in the range of 45% to 50%. - Aortic valve: Mildly calcified leaflets. Mild to moderate   st[place]. Mean gradient (S): 17 mm Hg. Peak gradient (S): 28 mm   Hg. Valve area (VTI): 1.01 cm^2. Valve area (Vmax): 1.1 cm^2.   Valve area (Vmean): 1.02 cm^2.   Impressions:   - Compared to the recent study on 12/12/16, the LVEF is unchaged at   45-50%. There is mild to moderate aortic stenosis. No pericardial   effusion is noted.   11/2017 echo Study Conclusions   - Left ventricle: The cavity size was normal. There was mild    concentric hypertrophy. Systolic function was normal. Wall motion    was normal; there were no regional wall motion abnormalities.    Doppler parameters are consistent with abnormal left ventricular    relaxation (grade 1 diastolic dysfunction). Doppler parameters    are consistent with high ventricular filling pressure.  - Aortic valve: Valve mobility was restricted. There was mild    stenosis. There was mild regurgitation. Peak velocity (S): 288    cm/s. Mean gradient (S): 17 mm Hg. Valve area (VTI): 0.83 cm^2.    Valve area (Vmax): 0.85 cm^2. Valve area (Vmean): 0.87 cm^2.  - Mitral valve: Transvalvular velocity was within the normal range.    There was no evidence for stenosis. There was trivial    regurgitation.  - Right ventricle: The cavity  size was normal. Wall thickness was    normal. Systolic function was normal.  - Atrial septum: No defect or patent foramen ovale was identified.  - Tricuspid valve: There was mild regurgitation.  - Pulmonary arteries: PA peak pressure: 27 mm Hg (S).      10/2018 nuclear stress The left ventricular ejection fraction is normal (55-65%). Nuclear stress EF: 56%. There was no ST segment deviation noted during stress. The study is normal. This is a low risk study.     04/2020 echo IMPRESSIONS     1. Basal inferior akinesis. Left ventricular ejection fraction, by  estimation, is 55 to 60%. The left ventricle has normal function. Left  ventricular diastolic parameters are indeterminate.   2. Right ventricular systolic function is low normal. The right  ventricular size is mildly enlarged.   3. Trivial mitral valve regurgitation.   4. AV is thickened, calcified with restricted motion. Peak and mean  gradients through the valve are 68 and 43 mm Hg respectively Dimensionless  index is 0.23 consistent with severe AS. Compared to echo from 9.19.21,  gradients are increased. The aortic  valve is tricuspid. Aortic valve regurgitation is not visualized.   5. The inferior vena cava is normal in size with greater than 50%  respiratory variability, suggesting right atrial pressure of 3 mmHg.    05/2020 cath Ost LM to LM lesion is 99% stenosed. Ost 2nd Diag to 2nd Diag lesion is 40% stenosed. Mid LAD lesion is 30% stenosed. Prox LAD to Mid LAD lesion is 40% stenosed. Ost Cx to Prox Cx lesion is 100% stenosed. Mid RCA lesion is 30% stenosed. Prox RCA lesion is 80% stenosed. Ost RPDA to RPDA lesion is 90% stenosed. Dist RCA lesion is 50% stenosed. 2nd Mrg  lesion is 50% stenosed. Mid LM to Prox LAD lesion is 95% stenosed. Mid Graft to Insertion lesion is 100% stenosed.   1. Severe left main stenosis 2. Severe ostial LAD stenosis. Patent Vein graft to the Diagonal Emily Rocha that fills the LAD. The  LIMA in injected and does not fill the LAD although it may just be atretic distally. It is not clear that the LIMA was anastomosed to the LAD. The operative note indicates the LIMA was used.  3. Chronic occlusion ostial Circumflex. Patent vein graft to the obtuse marginal Emily Rocha with moderate disease in the obtuse marginal Emily Rocha.  4. The RCA is a large dominant vessel. There is a heavily calcified proximal stenosis that appears to be severe angiographically. RFR with flow wire was 0.89 suggesting the lesion is flow limiting.  5. Severe aortic stenosis (mean gradient 40.3 mmHg, peak to peak gradient 55 mmHg, AVA 0.75 cm2)   Recommendations: She will need to have staged PCI of the RCA with orbital atherectomy and stenting. I will plan this for next week. After the PCI, will then continue planning for TAVR with CT scans.        Assessment and Plan   1. Severe aortic stenosis s/p TAVR - doing well 1 year out from TAVR, recent echo with normal TAVR valve function - continue to monitor  2. CAD - no symptoms, continue current meds  3. Chronic diastolic HF - recent edema in setting of prednisone use for COPD exacerbation, quickly resolved with extra torsemide - continue torsemide 40mg  daily  4. COPD exacerbation - per pcp. Sat 78% today, reports pcp noted and wanted her to go to hospital but she turned down. She does have home O2, discussed she needs to wear O2 continously.  5. Hyperlipidemia - LDL is 37 and at goal, continue atorvastatin.    F/u 6 months  Arnoldo Lenis, M.D.

## 2021-07-22 NOTE — Patient Instructions (Signed)
Medication Instructions:  Your physician recommends that you continue on your current medications as directed. Please refer to the Current Medication list given to you today.   Labwork: None  Testing/Procedures: None  Follow-Up: Follow up with Dr. Branch in 6 months.   Any Other Special Instructions Will Be Listed Below (If Applicable).     If you need a refill on your cardiac medications before your next appointment, please call your pharmacy.  

## 2021-07-31 ENCOUNTER — Telehealth: Payer: Self-pay | Admitting: *Deleted

## 2021-07-31 NOTE — Telephone Encounter (Signed)
Ms. Gasiorowski contacted the office with c/o ankle swelling. Patient states that she has never experienced ankle swelling despite having multiple heart surgeries and lung surgery. Per patient the swelling began approximately two months ago. Patient states she has recently seen her cardiologist and took an extra dose of torsemide for excess swelling and that has helped. Patient concerned swelling is related to possible heart blockages. Advised patient to try to elevate legs while at rest and to utilize compression stockings if able. Advised patient to contact Cardiologist to further discuss ongoing swelling in ankles. Patient verbalized understanding.

## 2021-08-06 DIAGNOSIS — F17210 Nicotine dependence, cigarettes, uncomplicated: Secondary | ICD-10-CM | POA: Diagnosis not present

## 2021-08-06 DIAGNOSIS — Z1239 Encounter for other screening for malignant neoplasm of breast: Secondary | ICD-10-CM | POA: Diagnosis not present

## 2021-08-06 DIAGNOSIS — Z79899 Other long term (current) drug therapy: Secondary | ICD-10-CM | POA: Diagnosis not present

## 2021-08-06 DIAGNOSIS — D692 Other nonthrombocytopenic purpura: Secondary | ICD-10-CM | POA: Diagnosis not present

## 2021-08-06 DIAGNOSIS — Z299 Encounter for prophylactic measures, unspecified: Secondary | ICD-10-CM | POA: Diagnosis not present

## 2021-08-06 DIAGNOSIS — Z7189 Other specified counseling: Secondary | ICD-10-CM | POA: Diagnosis not present

## 2021-08-06 DIAGNOSIS — R5383 Other fatigue: Secondary | ICD-10-CM | POA: Diagnosis not present

## 2021-08-06 DIAGNOSIS — E7800 Pure hypercholesterolemia, unspecified: Secondary | ICD-10-CM | POA: Diagnosis not present

## 2021-08-06 DIAGNOSIS — Z1331 Encounter for screening for depression: Secondary | ICD-10-CM | POA: Diagnosis not present

## 2021-08-06 DIAGNOSIS — Z0000 Encounter for general adult medical examination without abnormal findings: Secondary | ICD-10-CM | POA: Diagnosis not present

## 2021-08-06 DIAGNOSIS — Z1339 Encounter for screening examination for other mental health and behavioral disorders: Secondary | ICD-10-CM | POA: Diagnosis not present

## 2021-08-06 DIAGNOSIS — Z6825 Body mass index (BMI) 25.0-25.9, adult: Secondary | ICD-10-CM | POA: Diagnosis not present

## 2021-08-14 ENCOUNTER — Other Ambulatory Visit: Payer: Self-pay | Admitting: Cardiology

## 2021-09-07 ENCOUNTER — Telehealth: Payer: Self-pay | Admitting: Cardiology

## 2021-09-07 NOTE — Telephone Encounter (Signed)
Spoke to pt who stated that she has gained 10 lbs within 15 days. Pt stated that her feet/ankles are swollen. Pt stated that she feels terrible. Pt c/o SOB, but stated that she feels like this is coming from something other than her COPD. Pt stated that she is compliant with Torsemide 40 mg daily Monday- Saturday. Pt stated that she took an extra tablet on 7/23, but it did not help her.   Pt weight's: 7/18- 144.8 7/19-146.4 7/20- 145.6 7/21- 147.8 7/22- 152 7/23- 151.2 7/24- 149.4  Please advise.

## 2021-09-07 NOTE — Telephone Encounter (Signed)
Pt c/o swelling: STAT is pt has developed SOB within 24 hours  How much weight have you gained and in what time span? 10lbs   If swelling, where is the swelling located? Ankles   Are you currently taking a fluid pill? yes  Are you currently SOB? yes  Do you have a log of your daily weights (if so, list)?    Have you gained 3 pounds in a day or 5 pounds in a week? yes  Have you traveled recently? no

## 2021-09-08 NOTE — Telephone Encounter (Signed)
Patient notified and verbalized understanding Patient will increase torsemide to 40 mg BID and update office Friday with weight.

## 2021-09-08 NOTE — Telephone Encounter (Signed)
Increase toresmide to 40mg  bid until Friday and then call and update [place]   Zandra Abts MD

## 2021-09-11 DIAGNOSIS — R188 Other ascites: Secondary | ICD-10-CM | POA: Diagnosis not present

## 2021-09-11 DIAGNOSIS — I779 Disorder of arteries and arterioles, unspecified: Secondary | ICD-10-CM | POA: Diagnosis not present

## 2021-09-11 DIAGNOSIS — I700 Atherosclerosis of aorta: Secondary | ICD-10-CM | POA: Diagnosis not present

## 2021-09-11 DIAGNOSIS — E2839 Other primary ovarian failure: Secondary | ICD-10-CM | POA: Diagnosis not present

## 2021-09-11 DIAGNOSIS — I4891 Unspecified atrial fibrillation: Secondary | ICD-10-CM | POA: Diagnosis not present

## 2021-09-11 DIAGNOSIS — Z299 Encounter for prophylactic measures, unspecified: Secondary | ICD-10-CM | POA: Diagnosis not present

## 2021-09-11 NOTE — Telephone Encounter (Signed)
Pt stated that she has lost 8 lbs total- states that her ankles are still slightly swollen, but better than what they had been. Patient would like to know if she needs to continue taking torsemide 40 mg BID. Please advise.

## 2021-09-11 NOTE — Telephone Encounter (Signed)
Patient returned call to report on weight.

## 2021-09-16 ENCOUNTER — Other Ambulatory Visit (HOSPITAL_COMMUNITY): Payer: Self-pay | Admitting: Internal Medicine

## 2021-09-16 ENCOUNTER — Other Ambulatory Visit: Payer: Self-pay | Admitting: Internal Medicine

## 2021-09-16 DIAGNOSIS — R188 Other ascites: Secondary | ICD-10-CM

## 2021-09-18 NOTE — Telephone Encounter (Signed)
Can go to torsemide 40mg  daily, can take the additional 40mg  as needed for swelling or weight gain  Zandra Abts MD

## 2021-09-18 NOTE — Telephone Encounter (Signed)
Patient notified and verbalized understanding. Patient had no other questions at this time.

## 2021-09-21 ENCOUNTER — Ambulatory Visit (HOSPITAL_COMMUNITY)
Admission: RE | Admit: 2021-09-21 | Discharge: 2021-09-21 | Disposition: A | Discharge status: Home / Self Care | Payer: Medicare HMO | Source: Ambulatory Visit | Attending: Internal Medicine | Admitting: Internal Medicine

## 2021-09-21 DIAGNOSIS — R188 Other ascites: Secondary | ICD-10-CM | POA: Insufficient documentation

## 2021-09-21 DIAGNOSIS — Z9049 Acquired absence of other specified parts of digestive tract: Secondary | ICD-10-CM | POA: Diagnosis not present

## 2021-09-21 DIAGNOSIS — R1900 Intra-abdominal and pelvic swelling, mass and lump, unspecified site: Secondary | ICD-10-CM | POA: Diagnosis not present

## 2021-09-30 DIAGNOSIS — J9611 Chronic respiratory failure with hypoxia: Secondary | ICD-10-CM | POA: Diagnosis not present

## 2021-09-30 DIAGNOSIS — J209 Acute bronchitis, unspecified: Secondary | ICD-10-CM | POA: Diagnosis not present

## 2021-09-30 DIAGNOSIS — J440 Chronic obstructive pulmonary disease with acute lower respiratory infection: Secondary | ICD-10-CM | POA: Diagnosis not present

## 2021-09-30 DIAGNOSIS — Z299 Encounter for prophylactic measures, unspecified: Secondary | ICD-10-CM | POA: Diagnosis not present

## 2021-09-30 DIAGNOSIS — I4891 Unspecified atrial fibrillation: Secondary | ICD-10-CM | POA: Diagnosis not present

## 2021-09-30 DIAGNOSIS — F17210 Nicotine dependence, cigarettes, uncomplicated: Secondary | ICD-10-CM | POA: Diagnosis not present

## 2021-10-02 DIAGNOSIS — J9611 Chronic respiratory failure with hypoxia: Secondary | ICD-10-CM | POA: Diagnosis not present

## 2021-10-02 DIAGNOSIS — Z299 Encounter for prophylactic measures, unspecified: Secondary | ICD-10-CM | POA: Diagnosis not present

## 2021-10-02 DIAGNOSIS — J439 Emphysema, unspecified: Secondary | ICD-10-CM | POA: Diagnosis not present

## 2021-10-02 DIAGNOSIS — J441 Chronic obstructive pulmonary disease with (acute) exacerbation: Secondary | ICD-10-CM | POA: Diagnosis not present

## 2021-10-06 DIAGNOSIS — J449 Chronic obstructive pulmonary disease, unspecified: Secondary | ICD-10-CM | POA: Diagnosis not present

## 2021-11-09 DIAGNOSIS — F1320 Sedative, hypnotic or anxiolytic dependence, uncomplicated: Secondary | ICD-10-CM | POA: Diagnosis not present

## 2021-11-09 DIAGNOSIS — I4891 Unspecified atrial fibrillation: Secondary | ICD-10-CM | POA: Diagnosis not present

## 2021-11-09 DIAGNOSIS — F419 Anxiety disorder, unspecified: Secondary | ICD-10-CM | POA: Diagnosis not present

## 2021-11-09 DIAGNOSIS — J9611 Chronic respiratory failure with hypoxia: Secondary | ICD-10-CM | POA: Diagnosis not present

## 2021-11-09 DIAGNOSIS — Z299 Encounter for prophylactic measures, unspecified: Secondary | ICD-10-CM | POA: Diagnosis not present

## 2021-12-17 ENCOUNTER — Other Ambulatory Visit: Payer: Self-pay | Admitting: Internal Medicine

## 2021-12-17 DIAGNOSIS — Z1231 Encounter for screening mammogram for malignant neoplasm of breast: Secondary | ICD-10-CM

## 2021-12-23 ENCOUNTER — Ambulatory Visit
Admission: RE | Admit: 2021-12-23 | Discharge: 2021-12-23 | Disposition: A | Discharge status: Home / Self Care | Payer: Medicare HMO | Source: Ambulatory Visit | Attending: Internal Medicine | Admitting: Internal Medicine

## 2021-12-23 DIAGNOSIS — Z1231 Encounter for screening mammogram for malignant neoplasm of breast: Secondary | ICD-10-CM

## 2022-01-01 IMAGING — DX DG CHEST 1V PORT
1 series · 1 of 1 positions shown · non-contrast
Comparison: May 25, 2019

CLINICAL DATA: Shortness of breath with fever and chills

EXAM:
PORTABLE CHEST 1 VIEW

[chest ap]
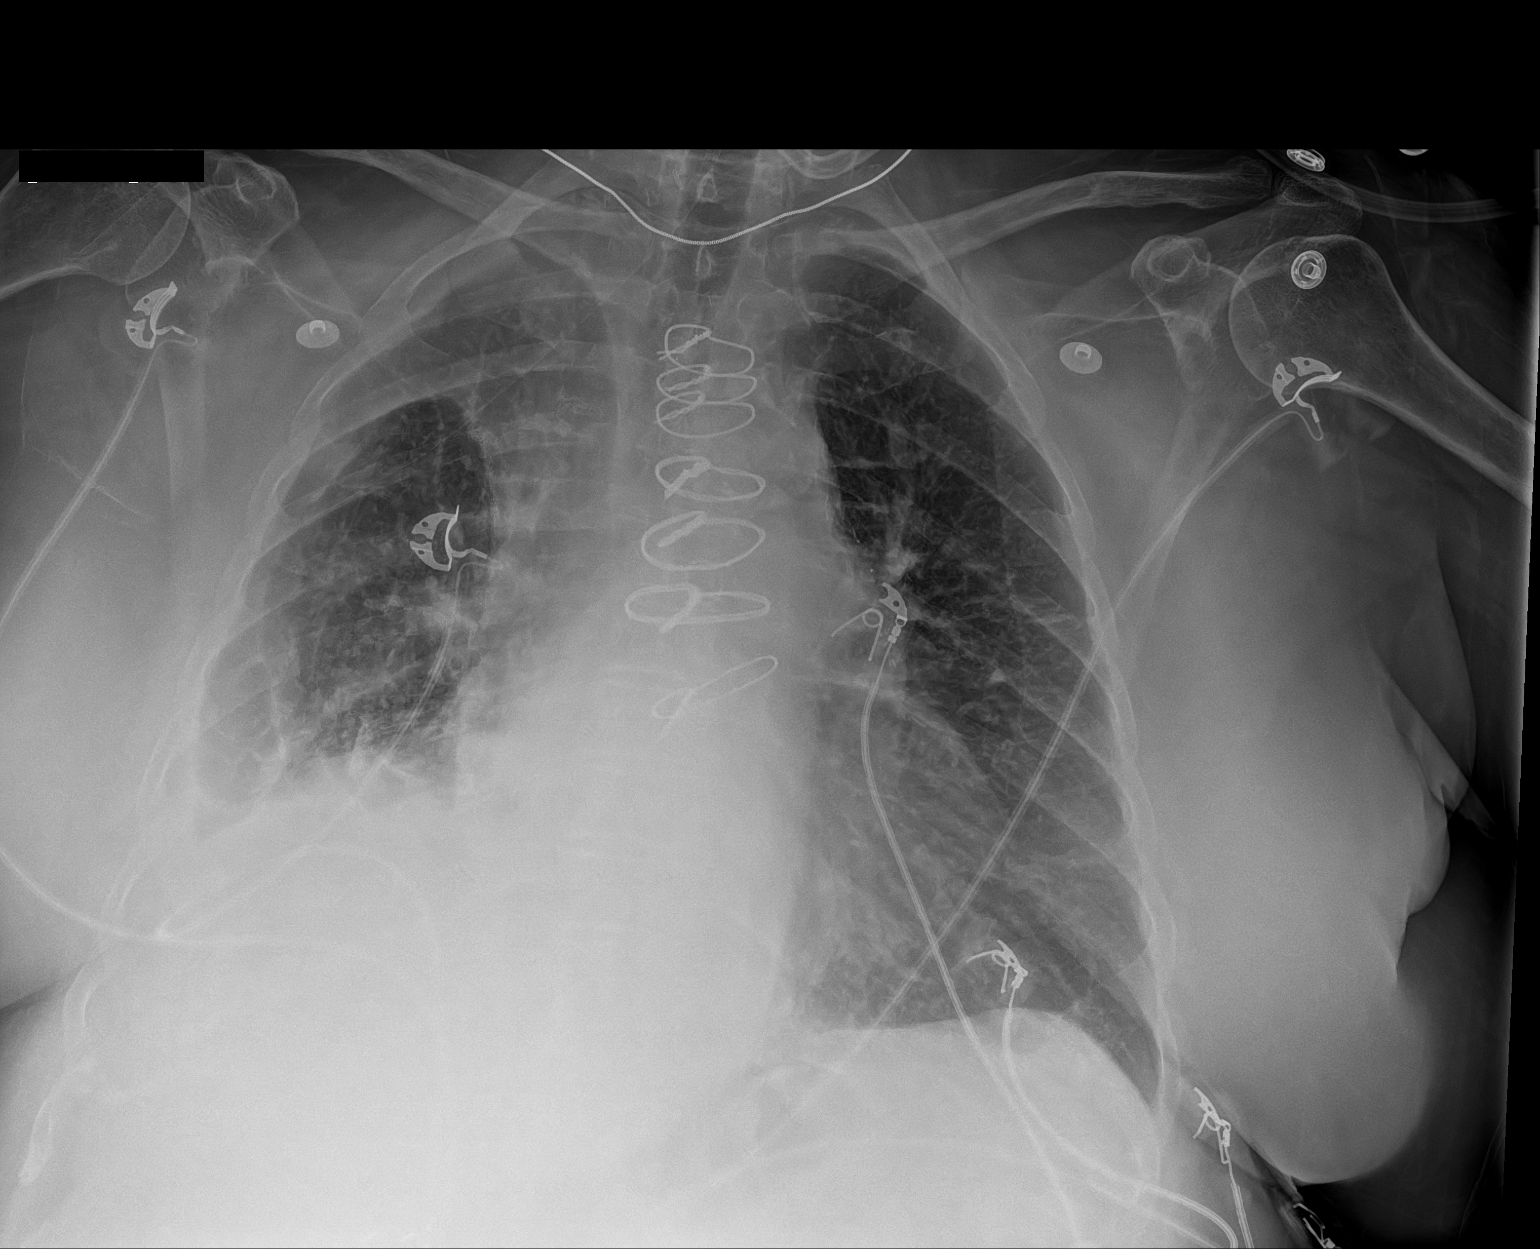

[1 of 1 positions shown; findings below may reference images not displayed]

FINDINGS: There is a small right pleural effusion. There is postoperative
change with scarring on the right. There is no edema or airspace
opacity. Heart is upper normal in size with pulmonary vascularity
normal. Patient is status post internal mammary bypass grafting. No
adenopathy. No bone lesions.
IMPRESSION: Small right pleural effusion. Scarring with volume loss on the
right. No frank edema or airspace opacity. Stable cardiac
silhouette. Postoperative changes noted.

## 2022-01-02 IMAGING — US US CHEST/MEDIASTINUM
2 series · 8 of 8 positions shown · non-contrast
Comparison: CT angio chest 10/24/2019

CLINICAL DATA: RIGHT pleural effusion by CT, history of COPD, GERD,
RIGHT upper lobe lung cancer post upper lobectomy

EXAM:
CHEST ULTRASOUND

[Series 1: us chest/mediastinum · 0.22mm/px · 6 of 6 slices shown (1 of 2)]
[im 1/6]
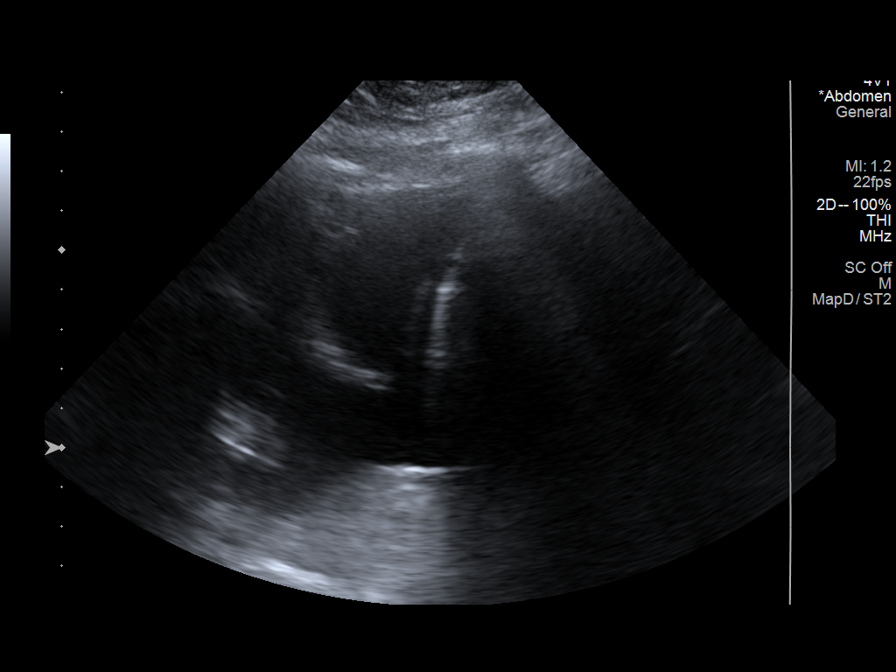
[im 2/6]
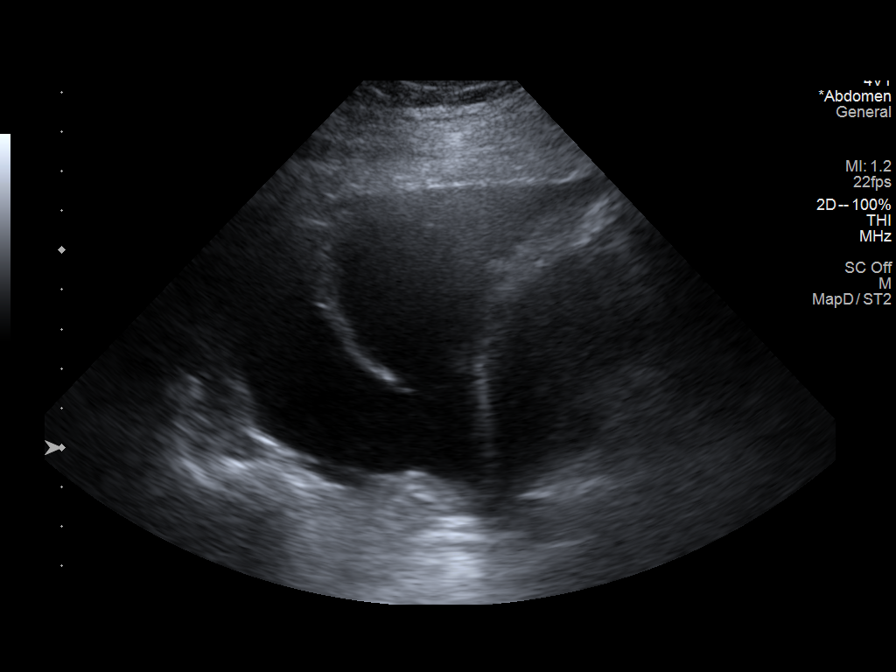
[im 3/6]
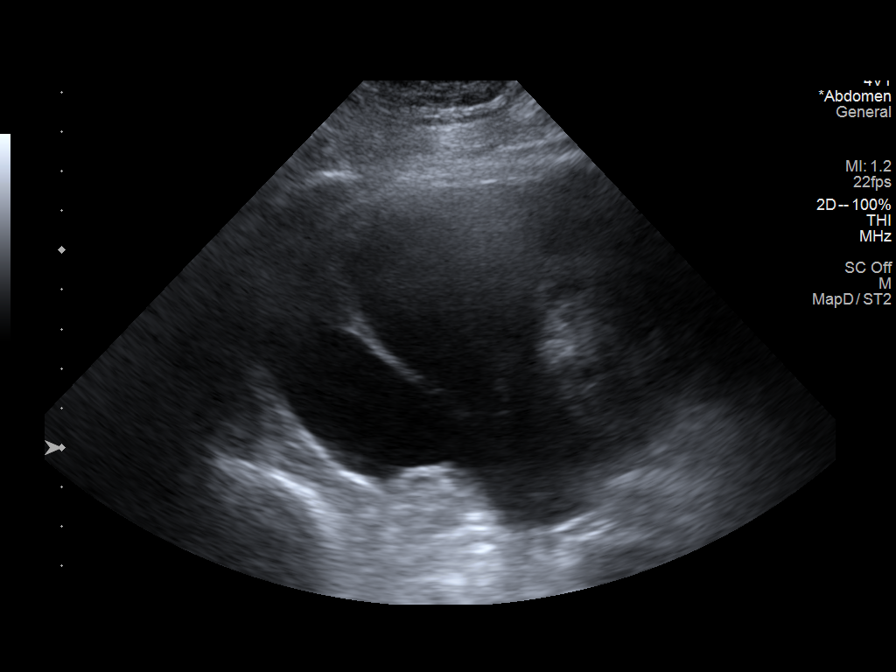
[im 4/6]
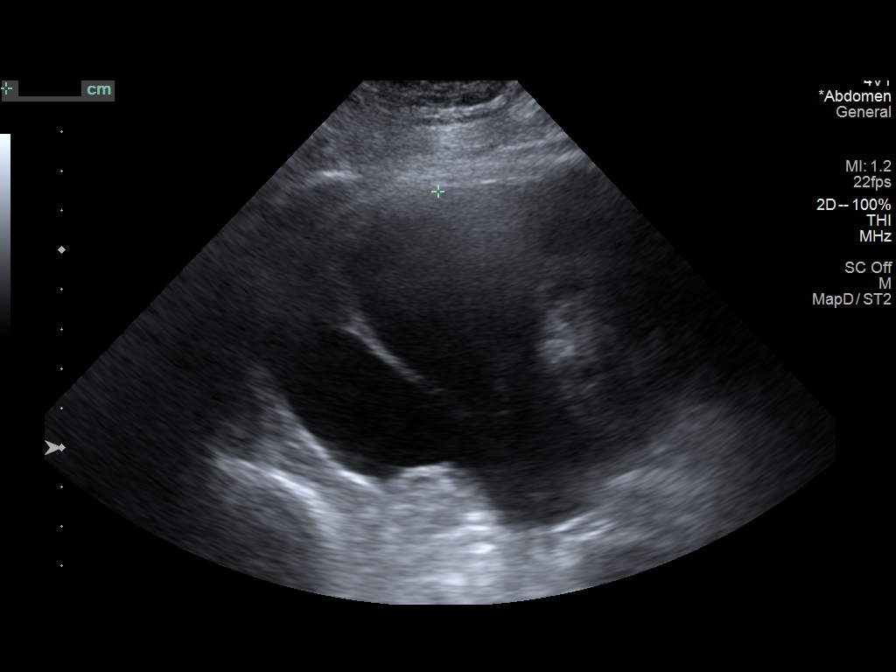
[im 5/6]
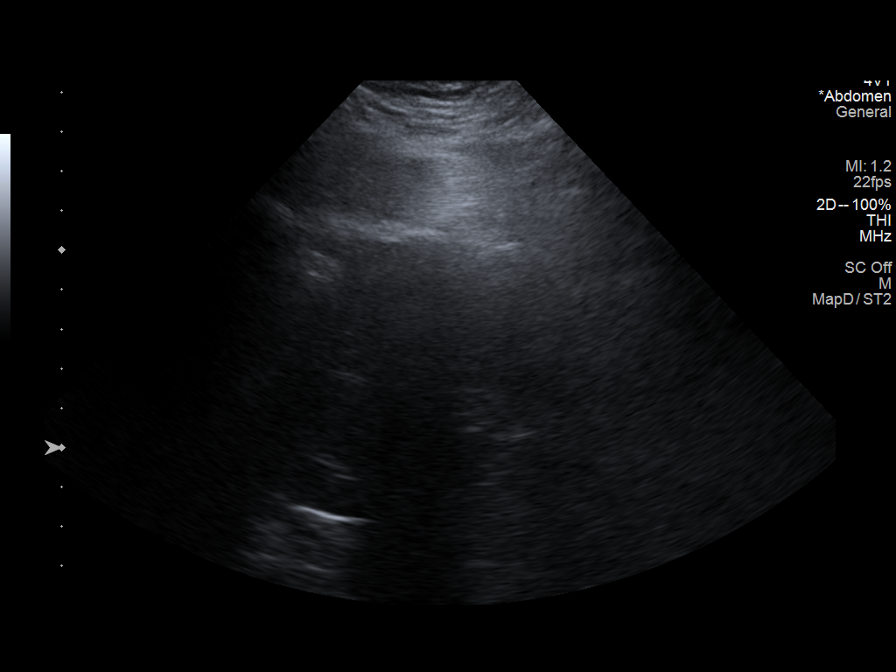
[im 6/6]
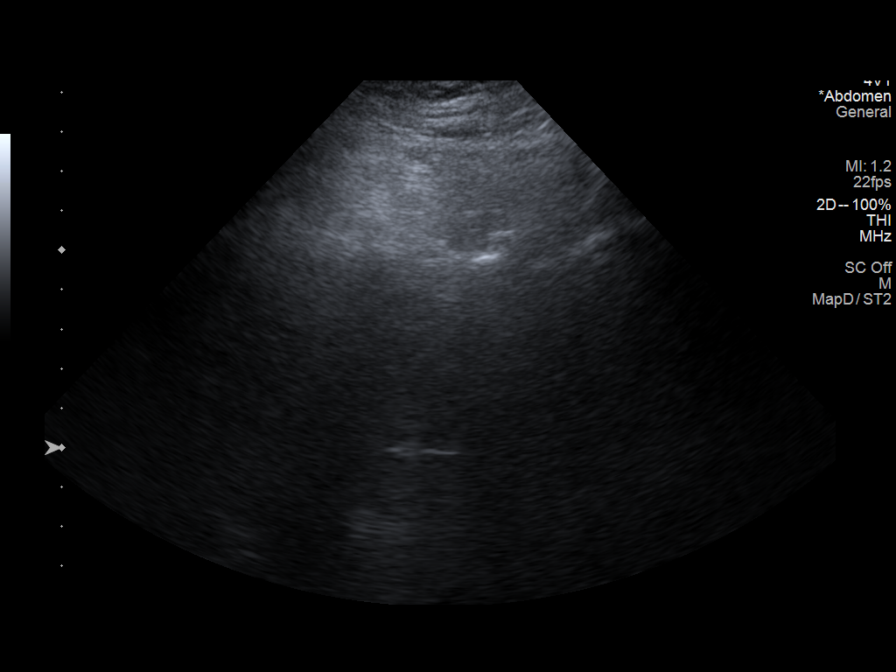

[Series 2: us chest/mediastinum · 0.22mm/px · 2 acquisitions, 2 frames shown (2 of 2)]
[im 1/2]
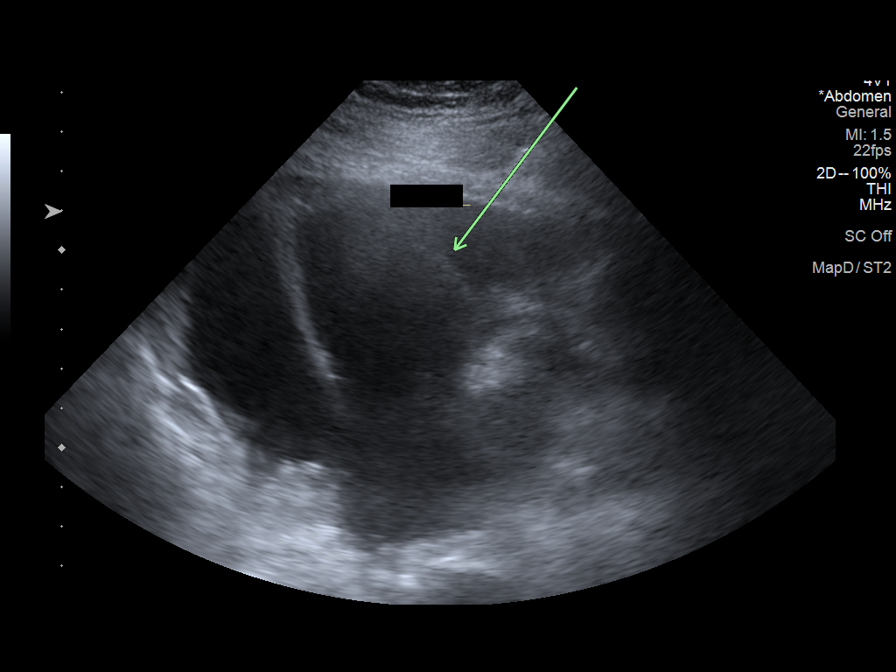
[im 2/2]
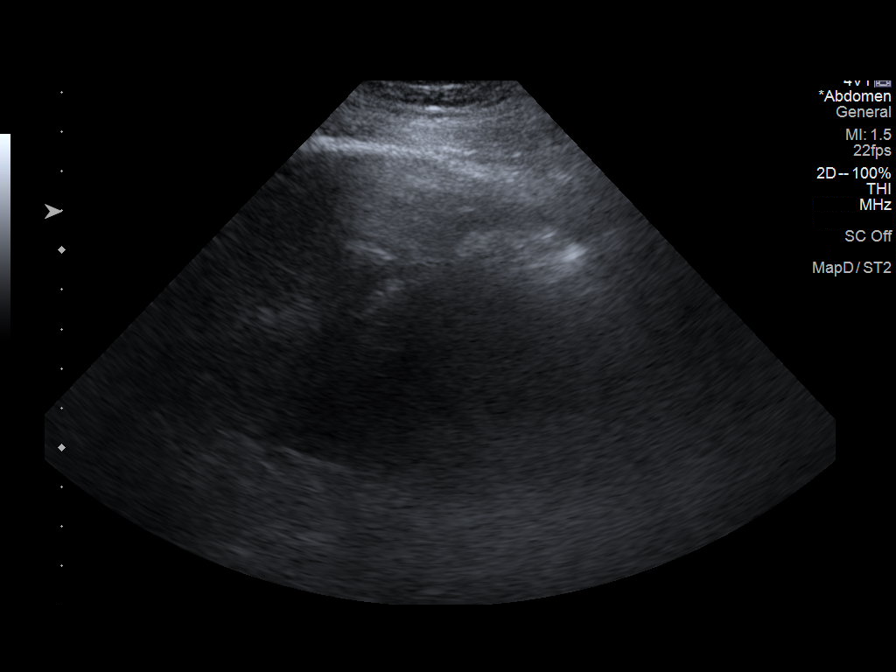

[8 of 8 positions shown; findings below may reference images not displayed]

FINDINGS: Small pleural effusion identified at the RIGHT lung base.

Effusion is partially loculated.

Atelectatic RIGHT lower lobe is adherent to the posterior chest wall
and prevents safe access of the inferior thoracic cavity for
thoracentesis.

This appearance was seen on the prior CT but appears fixed with
change in position on ultrasound.
IMPRESSION: Small loculated RIGHT pleural effusion, unable to safely access for
thoracentesis.

## 2022-01-10 IMAGING — CT CT IMAGE GUIDED DRAINAGE BY PERCUTANEOUS CATHETER
1 series · 15 of 32 positions shown, 19 images · non-contrast
Comparison: Chest CT-10/24/2019

INDICATION: Loculated right-sided pleural effusion. Please perform image guided
chest tube placement for infection source control purposes.

Note, there is attempt earlier this week to perform image guided
chest tube placement however the patient could not tolerate the
examination secondary to altered mental status.
EXAM:
LIMITED CHEST CT

[Series 2: i-spiral 5.0 b40f · axial · 0.77mm/px · z∈[+1062,+1307]mm · 15 of 78 slices shown, 19 images]
[im 5/78  soft-tissue]
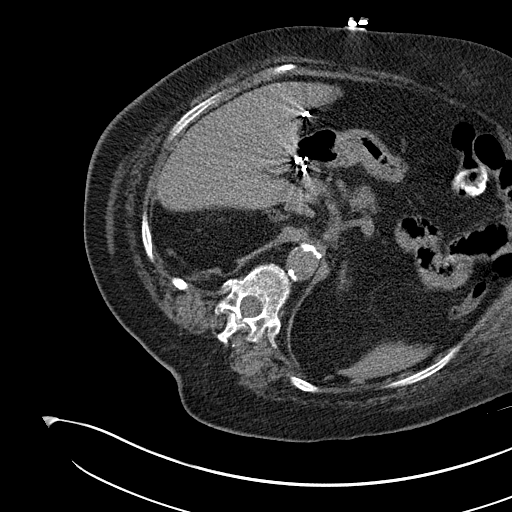
[im 5/78  bone]
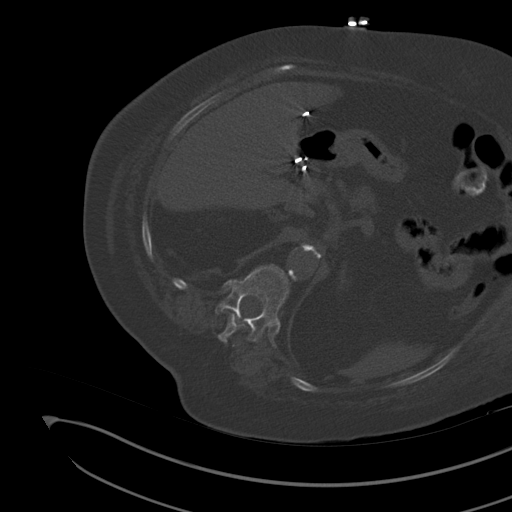
[im 10/78  soft-tissue]
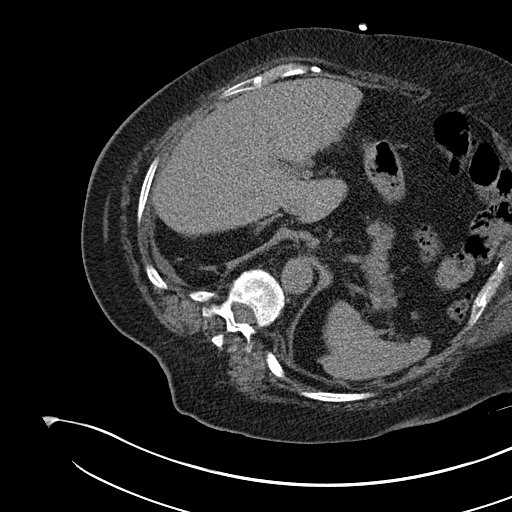
[im 15/78  soft-tissue]
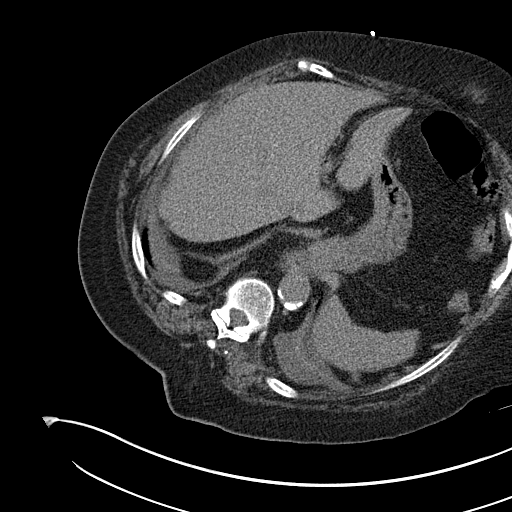
[im 23/78  soft-tissue]
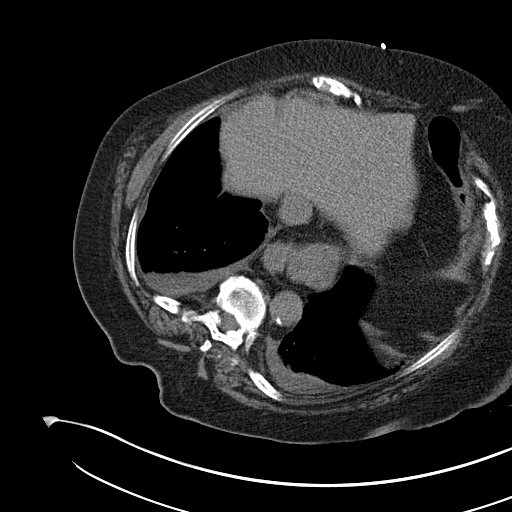
[im 28/78  soft-tissue]
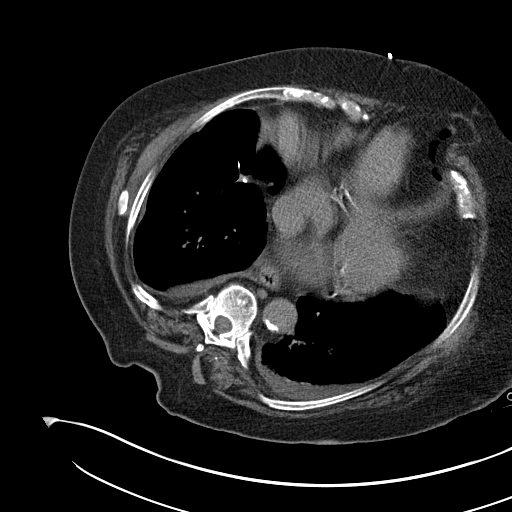
[im 33/78  soft-tissue]
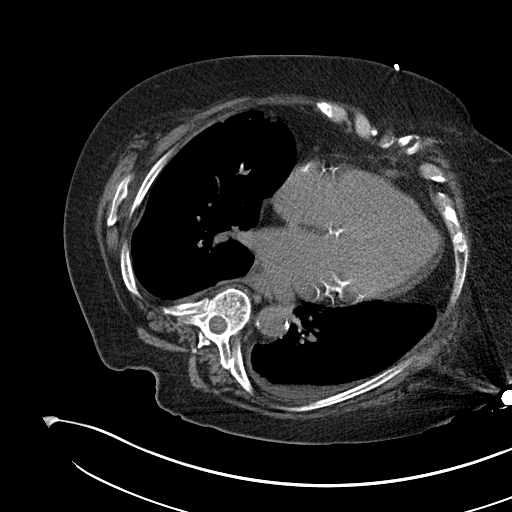
[im 40/78  soft-tissue]
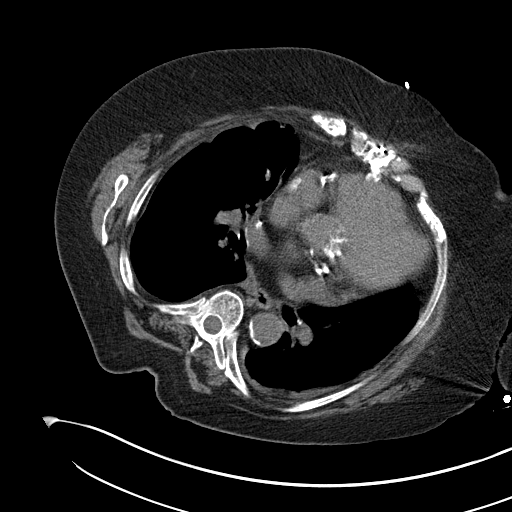
[im 45/78  soft-tissue]
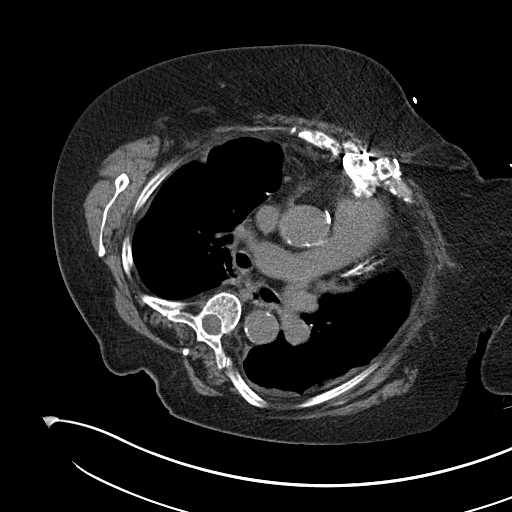
[im 50/78  soft-tissue]
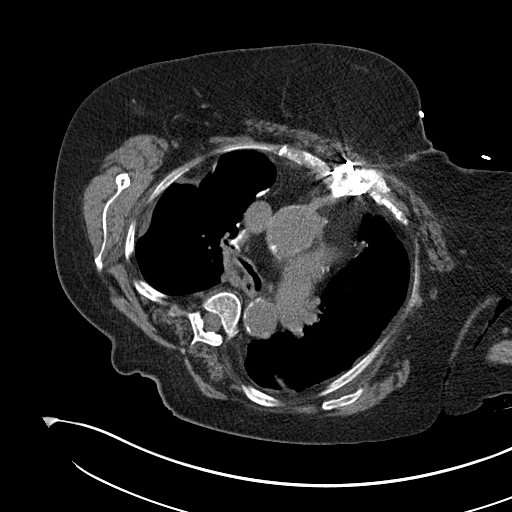
[im 50/78  bone]
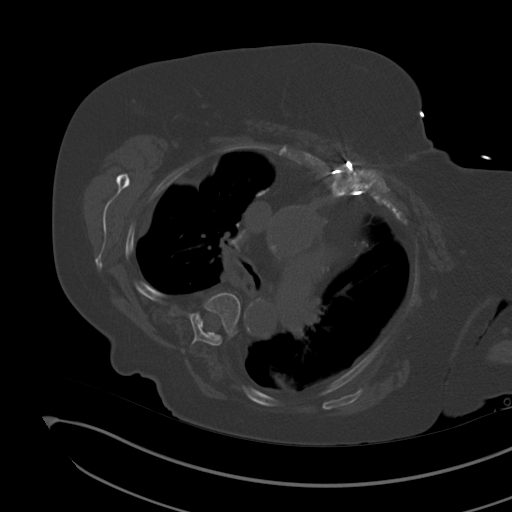
[im 55/78  soft-tissue]
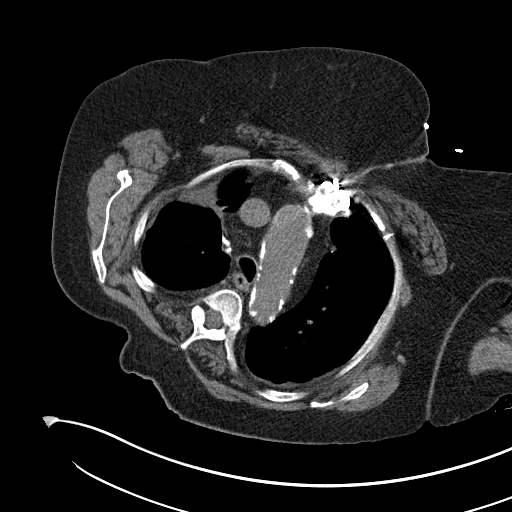
[im 63/78  soft-tissue]
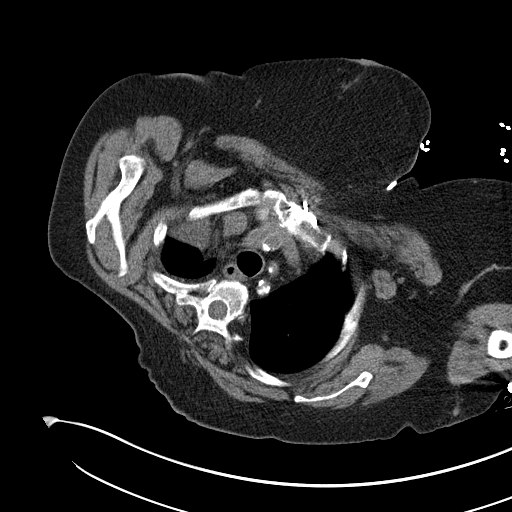
[im 68/78  soft-tissue]
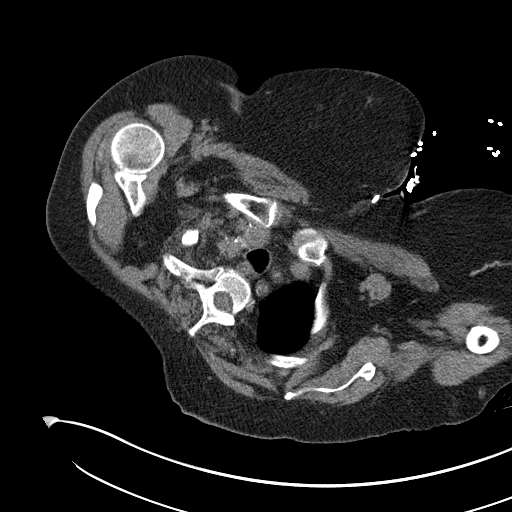
[im 68/78  lung]
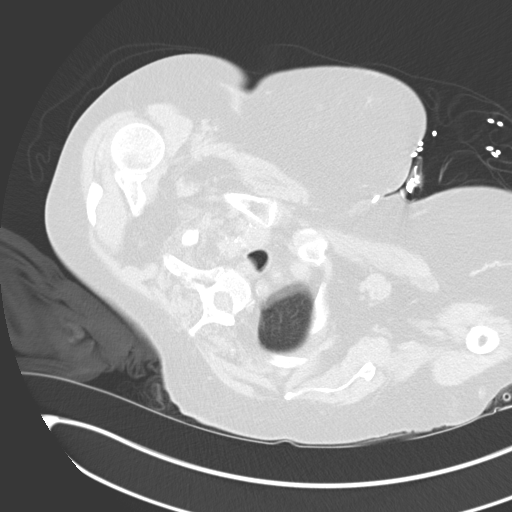
[im 70/78  lung]
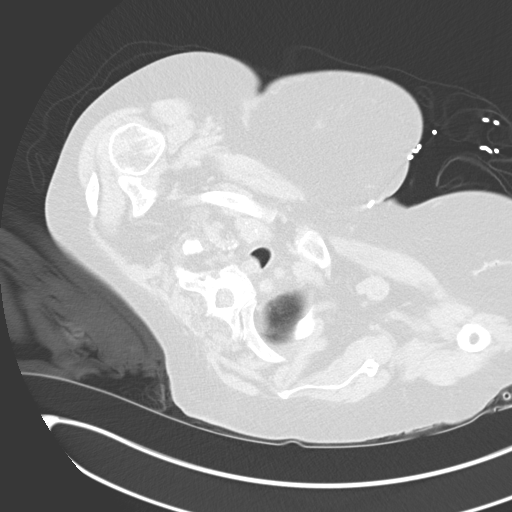
[im 73/78  soft-tissue]
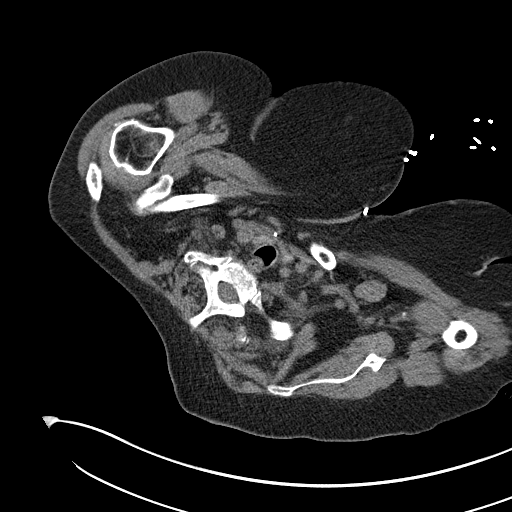
[im 73/78  lung]
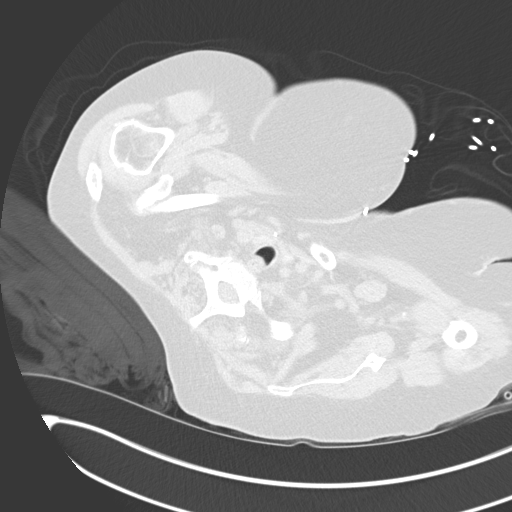
[im 75/78  lung]
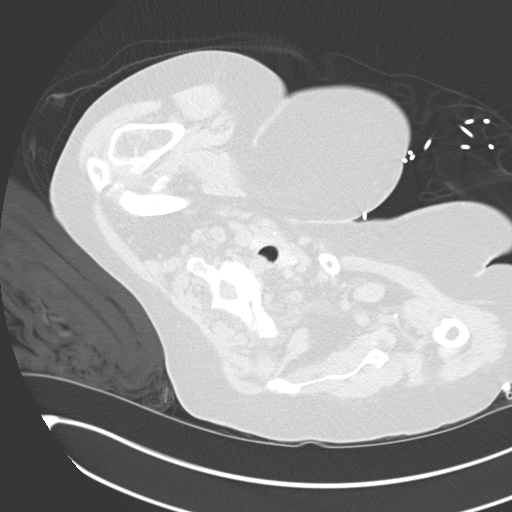

[15 of 32 positions shown; findings below may reference images not displayed]

MEDICATIONS:
None

ANESTHESIA/SEDATION:
None

CONTRAST:  None

COMPLICATIONS:
None immediate.

PROCEDURE:
Informed written consent was obtained from the patient after a
discussion of the risks, benefits and alternatives to treatment. The
patient was placed supine, slightly LPO on the CT gantry and a pre
procedural CT was performed demonstrating significant reduction,
near complete resolution of residual trace right-sided pleural
effusion with minimal amount of fluid seen at the level of the right
lung apex and posteromedial aspect of the right lung base, too small
to warrant CT-guided chest tube placement.

Above was discussed with referring cardiothoracic surgeon, Dr.
Prada, the decision was made not to proceed with intervention at
this time.
IMPRESSION: Significant reduction/near resolution of residual trace right-sided
effusion, too small to warrant CT-guided chest tube placement. No
procedure performed.

## 2022-01-10 IMAGING — DX DG CHEST 1V PORT
1 series · 1 of 1 positions shown · non-contrast
Comparison: 10/31/2019

CLINICAL DATA: Pleural effusion

EXAM:
PORTABLE CHEST 1 VIEW

[chest ap]
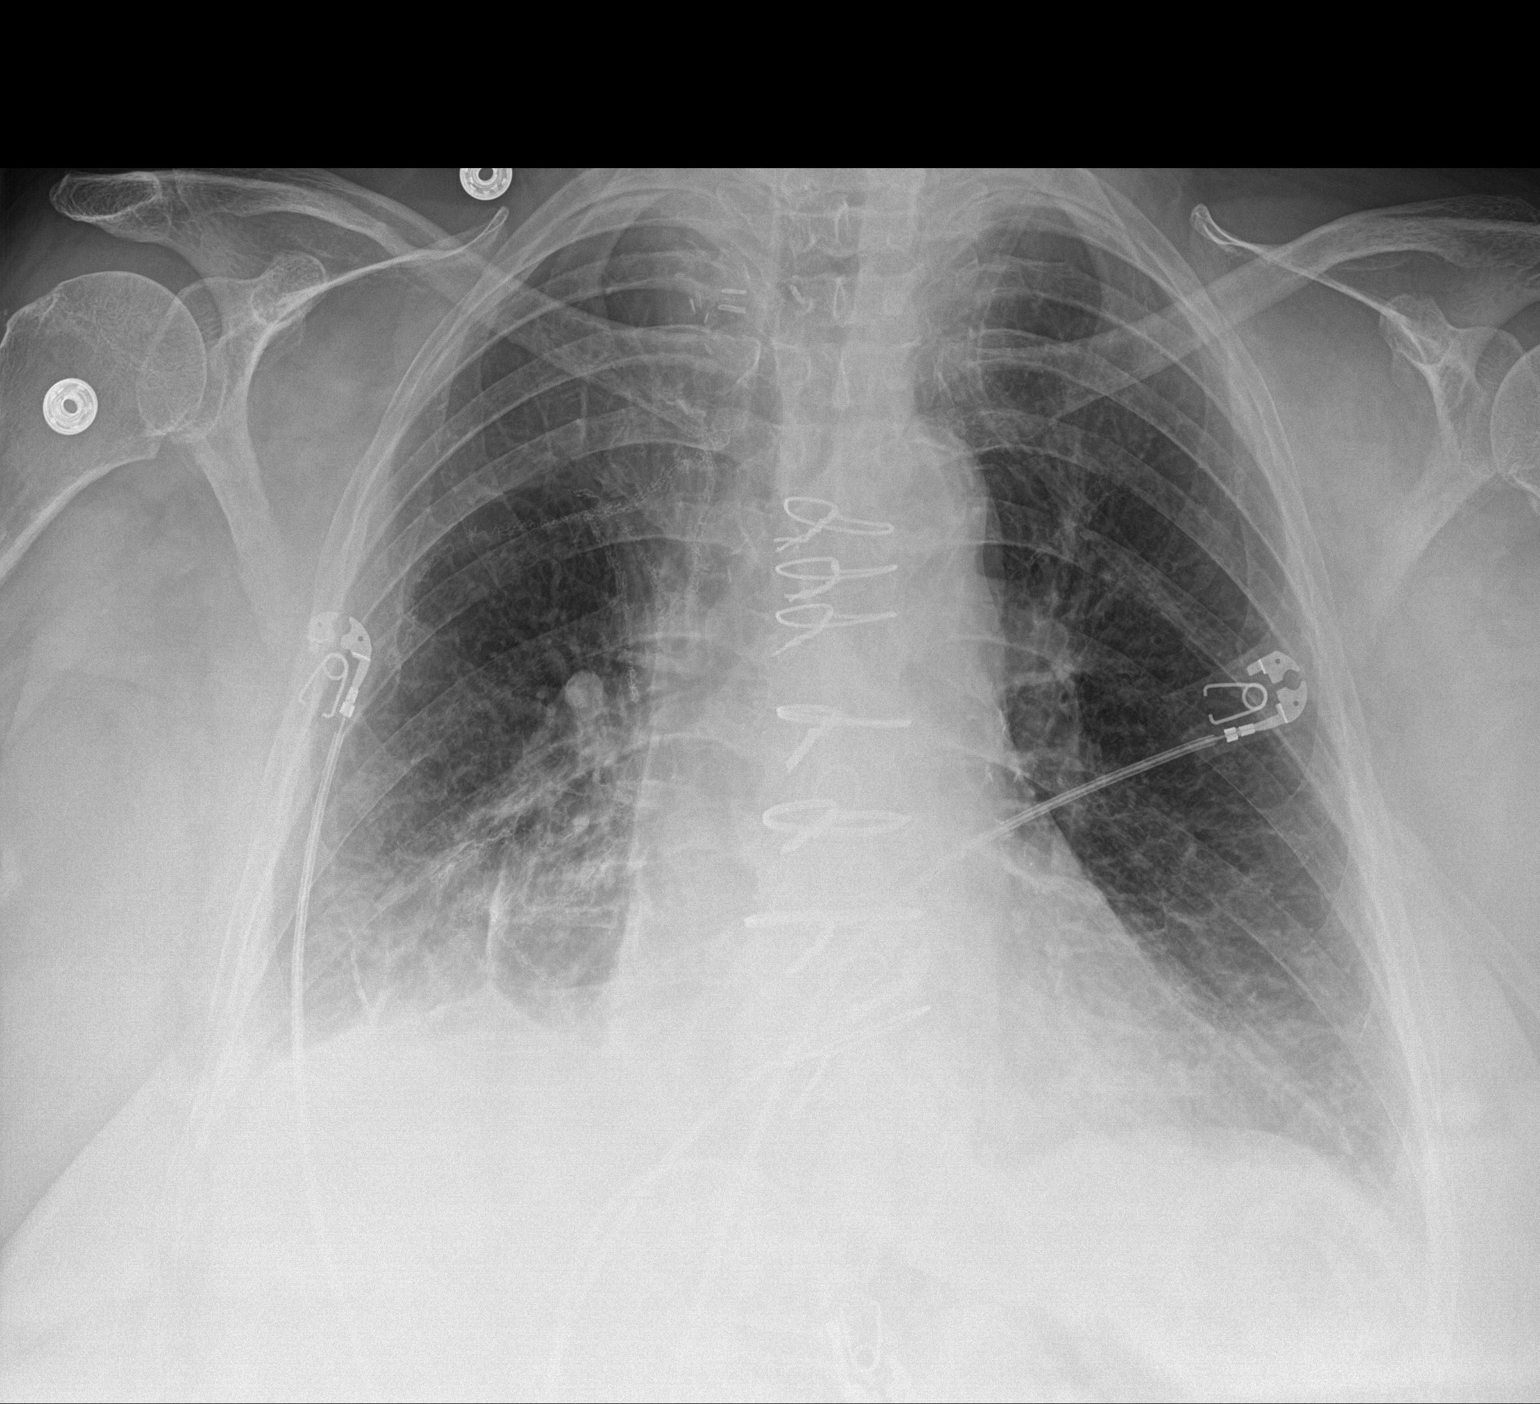

[1 of 1 positions shown; findings below may reference images not displayed]

FINDINGS: Prior median sternotomy and CABG. Heart is normal size. Bibasilar
airspace opacities, right greater than left with some improvement on
the right since prior study. No effusions or pneumothorax.
IMPRESSION: Bilateral lower lobe airspace opacities, right greater than left
with some improvement in aeration at the right base since prior
study.

## 2022-01-22 ENCOUNTER — Other Ambulatory Visit: Payer: Self-pay | Admitting: Thoracic Surgery (Cardiothoracic Vascular Surgery)

## 2022-01-22 DIAGNOSIS — Z902 Acquired absence of lung [part of]: Secondary | ICD-10-CM

## 2022-01-25 ENCOUNTER — Ambulatory Visit: Payer: Medicare HMO | Attending: Cardiology | Admitting: Cardiology

## 2022-01-25 ENCOUNTER — Encounter: Payer: Self-pay | Admitting: Cardiology

## 2022-01-25 VITALS — BP 112/72 | HR 64 | Ht 64.0 in | Wt 140.0 lb

## 2022-01-25 DIAGNOSIS — E782 Mixed hyperlipidemia: Secondary | ICD-10-CM | POA: Diagnosis not present

## 2022-01-25 DIAGNOSIS — I5032 Chronic diastolic (congestive) heart failure: Secondary | ICD-10-CM | POA: Diagnosis not present

## 2022-01-25 DIAGNOSIS — I2510 Atherosclerotic heart disease of native coronary artery without angina pectoris: Secondary | ICD-10-CM | POA: Diagnosis not present

## 2022-01-25 DIAGNOSIS — Z952 Presence of prosthetic heart valve: Secondary | ICD-10-CM

## 2022-01-25 NOTE — Addendum Note (Signed)
Addended by: Christella Scheuermann C on: 01/25/2022 02:40 PM   Modules accepted: Orders

## 2022-01-25 NOTE — Patient Instructions (Signed)
Medication Instructions:  Your physician recommends that you continue on your current medications as directed. Please refer to the Current Medication list given to you today.   Labwork: None  Testing/Procedures: None  Follow-Up: Follow up with Dr. Branch in 6 months.   Any Other Special Instructions Will Be Listed Below (If Applicable).     If you need a refill on your cardiac medications before your next appointment, please call your pharmacy.  

## 2022-01-25 NOTE — Progress Notes (Signed)
Clinical Summary Emily Rocha is a 66 y.o.female seen today for follow up of the following medical problems.    1. CAD - s/p CABG (LIMA-LAD, SVG-LCX, SVG-Diag) after presenting 11/2016 with NSTEMI. Presentation complicated by cardiogenic shock - echo 12/2016 LVEF 45-50% - developed wound infection followed by CT surgery that has now healed.      11/2017 echo LVEF normal LVEF, grade I DDX, mild AS, mild AI,   10/2018 nuclear stress no ischemia     05/2020 cath as reported below. Received PCI to prox to mid RCA - no chest pains, no SOB/DOE - compliant with meds       2. Carotid stenosis - history of prior symptomatic carotid stenosis with TIAs.  - history of prior right CEA - found to have high grade stenosis right inonominate artery, she is s/p right subclavian and right common carotid bypass 12/2017 - followed by vascular   2022 Carotid US mild bilateral disease.  - would repeat US in 2024     3. Chronic diastolic HF - recent weight gain 06/2021, took extra torsemide - had been prednisone.  - swelling has resolved with extra torsemide.  - home weight 150 lbs.   - no recent edema, home weight around 140 lbs and stable. Down 10 lbs since our last visit, more active and has made some dietary changes      4. Aortic stenosis -s/p TAVR 07/2020 - 06/2021 echo LVEF 65%, no WMAs, grade II dd, normal TAVR valve - no SOB/DOE, no chest pains     5. COPD - followed by pulmonary -reports marked improvement in symptoms since starting trelegy.      6. Lung cancer -s/p lung wedge resection       7. Syncope She was again admitted to [facility] from 11/12/2019 - 11/13/2019 for evaluation of multiple syncopal episodes and she was found on the ground by EMS. Imaging during admission did show a subdural hematoma and Neurosurgery recommended discontinuing Eliquis and nonoperative management. A repeat echocardiogram was performed and showed a preserved EF of 60 to 65%. This was read as  showing moderate to severe aortic stenosis and Dr. Harl Bowie felt it was more consistent with a moderate range and likely did not play a role in her syncopal episodes. She did have soft blood pressures during admission and was recommended to remain off Toprol-XL and Imdur until outpatient follow-up. Was also recommended to have an outpatient monitor so to assess for any significant arrhythmias.   - overall benign event monitor - no recurrent  episodes  8. Hyperlipdiemia - 05/2021 TC 113 TG 139 HDL 48 LDL 37  SH: Past Medical History:  Diagnosis Date   Anxiety    Asthma    COPD (chronic obstructive pulmonary disease) (HCC)    Coronary artery disease    a. s/p NSTEMI in 11/2016 and required CABG with LIMA-LAD, SVG-LCx and SVG-D1 and complicated by cardiogenic shock b. cath in 05/2020 showing severe RCA stenosis and treated with orbital atherectomy and stent placement   GERD (gastroesophageal reflux disease)    History of CVA (cerebrovascular accident)    History of lung cancer 2020   Peripheral vascular disease ([place])    S/P TAVR (transcatheter aortic valve replacement) 08/12/2020   s/p TAVR with a 23 mm Edwards S3U via the TF approach by Dr. Angelena Form & Dr. Cyndia Bent   Severe aortic stenosis      No Known Allergies   Current Outpatient Medications  Medication  Sig Dispense Refill   albuterol (PROVENTIL) (2.5 MG/3ML) 0.083% nebulizer solution Take 2.5 mg by nebulization every 4 (four) hours as needed for wheezing or shortness of breath.     albuterol (VENTOLIN HFA) 108 (90 Base) MCG/ACT inhaler Inhale 1-2 puffs into the lungs every 6 (six) hours as needed for wheezing or shortness of breath.     ALPRAZolam (XANAX) 0.5 MG tablet Take 0.5 mg by mouth 3 (three) times daily.     Ascorbic Acid (VITAMIN C GUMMIE PO) Chew 500 mg by mouth daily.     aspirin EC 81 MG tablet Take 1 tablet (81 mg total) by mouth daily. Swallow whole. 90 tablet 3   atorvastatin (LIPITOR) 40 MG tablet Take 1 tablet (40 mg  total) daily by mouth. 30 tablet 1   cholecalciferol (VITAMIN D3) 25 MCG (1000 UNIT) tablet Take 1,000 Units by mouth in the morning.     diphenhydramine-acetaminophen (TYLENOL PM) 25-500 MG TABS tablet Take 2 tablets by mouth at bedtime.     metoprolol succinate (TOPROL-XL) 50 MG 24 hr tablet TAKE ONE TABLET (50MG  TOTAL) BY MOUTH DAILY WITH OR IMMEDIATELY FOLLOWING A MEAL 90 tablet 1   Multiple Vitamin (MULTIVITAMIN WITH MINERALS) TABS tablet Take 1 tablet by mouth daily.     pantoprazole (PROTONIX) 40 MG tablet Take 40 mg by mouth in the morning.     potassium chloride SA (KLOR-CON) 20 MEQ tablet Take 20 mEq by mouth in the morning.     Tiotropium Bromide-Olodaterol (STIOLTO RESPIMAT) 2.5-2.5 MCG/ACT AERS Inhale 2 puffs into the lungs daily. 1 each 4   torsemide (DEMADEX) 20 MG tablet Take 40 mg by mouth See admin instructions. Take 2 tablets (40 mg) by mouth scheduled on Mondays, Tuesdays, Wednesdays, Thursdays, Fridays & Saturdays. (Hold dose on Sundays)     No current facility-administered medications for this visit.     Past Surgical History:  Procedure Laterality Date   AORTIC ARCH ANGIOGRAPHY N/A 11/04/2017   Procedure: AORTIC ARCH ANGIOGRAPHY;  Surgeon: Elam Dutch, MD;  Location: [place] [facility];  Service: Cardiovascular;  Laterality: N/A;   APPLICATION OF WOUND VAC N/A 01/06/2018   Procedure: APPLICATION OF WOUND VAC;  Surgeon: Ivin Poot, MD;  Location: [place];  Service: Thoracic;  Laterality: N/A;   APPLICATION OF WOUND VAC N/A 01/13/2018   Procedure: WOUND VAC CHANGE;  Surgeon: Ivin Poot, MD;  Location: [place] [place];  Service: Thoracic;  Laterality: N/A;   CARDIAC CATHETERIZATION     CAROTID-SUBCLAVIAN BYPASS GRAFT Right 12/26/2017   Procedure: AORTIC TO RIGHT COMMON CAROTID AND RIGHT SUBCLAVIAN  ARTERY  BYPASS;  Surgeon: Elam Dutch, MD;  Location: [place];  Service: Vascular;  Laterality: Right;   CHOLECYSTECTOMY     CLOSURE OF DIAPHRAGM  12/26/2017    Procedure: REPAIR OF DIAPHRAGM;  Surgeon: Ivin Poot, MD;  Location: [place];  Service: Thoracic;;   COLONOSCOPY N/A 10/03/2014   Procedure: COLONOSCOPY;  Surgeon: Rogene Houston, MD;  [facility];  Service: Endoscopy;  Laterality: N/A;  730   CORONARY ARTERY BYPASS GRAFT N/A 12/14/2016   Procedure: CORONARY ARTERY BYPASS GRAFTING (CABG) x three , using left internal mammary artery and right leg greater saphenous vein harvested endoscopically;  Surgeon: Ivin Poot, MD;  Location: [place];  Service: Open Heart Surgery;  Laterality: N/A;   CORONARY ATHERECTOMY N/A 06/11/2020   Procedure: CORONARY ATHERECTOMY;  Surgeon: Burnell Blanks, MD;  Location: [place] [facility];  Service: Cardiovascular;  Laterality: N/A;   CORONARY STENT INTERVENTION N/A 06/11/2020   Procedure: CORONARY STENT INTERVENTION;  Surgeon: Burnell Blanks, MD;  Location: [place] [facility];  Service: Cardiovascular;  Laterality: N/A;   ENDARTERECTOMY Right 12/06/2014   Procedure: ENDARTERECTOMY CAROTid;  Surgeon: Elam Dutch, MD;  Location: [facility];  Service: Vascular;  Laterality: Right;   ESOPHAGOGASTRODUODENOSCOPY N/A 12/03/2016   Procedure: ESOPHAGOGASTRODUODENOSCOPY (EGD);  Surgeon: Rogene Houston, MD;  [facility];  Service: Endoscopy;  Laterality: N/A;  7:30   INTRAVASCULAR IMAGING/OCT N/A 06/11/2020   Procedure: INTRAVASCULAR IMAGING/OCT;  Surgeon: Burnell Blanks, MD;  Location: [place] [facility];  Service: Cardiovascular;  Laterality: N/A;   INTRAVASCULAR PRESSURE WIRE/FFR STUDY N/A 06/04/2020   Procedure: INTRAVASCULAR PRESSURE WIRE/FFR STUDY;  Surgeon: Burnell Blanks, MD;  Location: [place] [facility];  Service: Cardiovascular;  Laterality: N/A;   LEFT HEART CATH AND CORONARY ANGIOGRAPHY N/A 12/14/2016   Procedure: LEFT HEART CATH AND CORONARY ANGIOGRAPHY;  Surgeon: Burnell Blanks, MD;  Location: [place] [facility];  Service: Cardiovascular;   Laterality: N/A;   LOBECTOMY Right 10/26/2018   Procedure: RIGHT UPPER LOBECTOMY;  Surgeon: Lajuana Matte, MD;  Location: [place];  Service: Thoracic;  Laterality: Right;   PERIPHERAL VASCULAR CATHETERIZATION N/A 12/05/2014   Procedure:  Carotid Arch Angiography;  Surgeon: Conrad Van Buren, MD;  Location: [place] [facility];  Service: Cardiovascular;  Laterality: N/A;   RIGHT/LEFT HEART CATH AND CORONARY/GRAFT ANGIOGRAPHY N/A 06/04/2020   Procedure: RIGHT/LEFT HEART CATH AND CORONARY/GRAFT ANGIOGRAPHY;  Surgeon: Burnell Blanks, MD;  Location: [place] [facility];  Service: Cardiovascular;  Laterality: N/A;   STERNAL WOUND DEBRIDEMENT N/A 01/06/2018   Procedure: STERNAL WOUND DEBRIDEMENT;  Surgeon: Ivin Poot, MD;  Location: [place];  Service: Thoracic;  Laterality: N/A;   STERNOTOMY N/A 12/26/2017   Procedure: REDO STERNOTOMY;  Surgeon: Prescott Gum, Collier Salina, MD;  Location: [place];  Service: Thoracic;  Laterality: N/A;   TEE WITHOUT CARDIOVERSION N/A 12/14/2016   Procedure: TRANSESOPHAGEAL ECHOCARDIOGRAM (TEE);  Surgeon: Prescott Gum, Collier Salina, MD;  Location: [place];  Service: Open Heart Surgery;  Laterality: N/A;   TEE WITHOUT CARDIOVERSION N/A 08/12/2020   Procedure: TRANSESOPHAGEAL ECHOCARDIOGRAM (TEE);  Surgeon: Burnell Blanks, MD;  Location: [place] [facility];  Service: Open Heart Surgery;  Laterality: N/A;   TRANSCATHETER AORTIC VALVE REPLACEMENT, TRANSFEMORAL N/A 08/12/2020   Procedure: TRANSCATHETER AORTIC VALVE REPLACEMENT, TRANSFEMORAL;  Surgeon: Burnell Blanks, MD;  Location: [place] [facility];  Service: Open Heart Surgery;  Laterality: N/A;   VIDEO ASSISTED THORACOSCOPY (VATS)/WEDGE RESECTION Right 10/26/2018   Procedure: VIDEO ASSISTED THORACOSCOPY (VATS)/WEDGE RESECTION;  Surgeon: Lajuana Matte, MD;  Location: [place];  Service: Thoracic;  Laterality: Right;   VIDEO BRONCHOSCOPY N/A 10/26/2018   Procedure: VIDEO BRONCHOSCOPY;  Surgeon: Lajuana Matte, MD;   Location: [facility];  Service: Thoracic;  Laterality: N/A;     No Known Allergies    Family History  Problem Relation Age of Onset   Heart attack Mother    Cancer Mother    Diabetes Mother    Heart disease Mother    Heart attack Father    Heart disease Father      Social History Ms. Krider reports that she has been smoking cigarettes. She has never used smokeless tobacco. Ms. Vantuyl reports no history of alcohol use.   Review of Systems CONSTITUTIONAL: No weight loss, fever, chills, weakness or fatigue.  HEENT: Eyes: No visual loss, blurred vision, double vision or yellow sclerae.No hearing loss, sneezing, congestion, runny nose or sore throat.  SKIN: No rash or itching.  CARDIOVASCULAR: per hpi RESPIRATORY: No shortness of breath, cough or sputum.  GASTROINTESTINAL: No anorexia, nausea, vomiting or diarrhea. No abdominal pain or blood.  GENITOURINARY: No burning on urination, no polyuria NEUROLOGICAL: No headache, dizziness, syncope, paralysis, ataxia, numbness or tingling in the extremities. No change in bowel or bladder control.  MUSCULOSKELETAL: No muscle, back pain, joint pain or stiffness.  LYMPHATICS: No enlarged nodes. No history of splenectomy.  PSYCHIATRIC: No history of depression or anxiety.  ENDOCRINOLOGIC: No reports of sweating, cold or heat intolerance. No polyuria or polydipsia.  Marland Kitchen   Physical Examination Today's Vitals   01/25/22 1336  BP: 112/72  Pulse: 64  SpO2: 94%  Weight: 140 lb (63.5 kg)  Height: 5\' 4"  (1.626 m)   Body mass index is 24.03 kg/m.  Gen: resting comfortably, no acute distress HEENT: no scleral icterus, pupils equal round and reactive, no palptable cervical adenopathy,  CV: RRR, 2/6 systoilc murmur rusb, no jvd Resp: Clear to auscultation bilaterally GI: abdomen is soft, non-tender, non-distended, normal bowel sounds, no hepatosplenomegaly MSK: extremities are warm, no edema.  Skin: warm, no rash Neuro:  no focal deficits Psych:  appropriate affect   Diagnostic Studies  06/2015 echo Study Conclusions   - Left ventricle: The cavity size was normal. Wall thickness was   increased in a pattern of mild concentric LVH, with moderate   focal basal septal hypertrophy. Systolic function was normal. The   estimated ejection fraction was in the range of 60% to 65%. Wall   motion was normal; there were no regional wall motion   abnormalities. Doppler parameters are consistent with abnormal   left ventricular relaxation (grade 1 diastolic dysfunction).   Indeterminate filling pressures. - Aortic valve: Mildly to moderately calcified annulus. Mildly   thickened, mildly calcified leaflets. There was mild stenosis.   Peak velocity (S): 231 cm/s. Mean gradient (S): 9 mm Hg. Valve   area (VTI): 1.69 cm^2. Valve area (Vmean): 1.69 cm^2. - Mitral valve: Mildly calcified annulus. Normal thickness leaflets   .   06/2015 Stress MPI Equivocal ST segment depression, 0.5 mm in leads II, III, aVF, and V4 through V6. Resolved quickly in recovery. Intermediate risk Duke treadmill score of 1.5 based on limited exercise time and nondiagnostic ST segment change. Blood pressure demonstrated a hypertensive response to exercise. Small, mild intensity, basal septal defect that exhibits partial reversibility, suspect related to variable soft tissue attenuation rather than ischemia. This is a low risk study based on perfusion imaging. Nuclear stress EF: 75%.   11/2016 cath Mid RCA lesion, 60 %stenosed. Prox RCA lesion, 40 %stenosed. Ost RPDA to RPDA lesion, 90 %stenosed. Ost Cx to Prox Cx lesion, 100 %stenosed. Mid LAD lesion, 80 %stenosed. Ost 2nd Diag to 2nd Diag lesion, 40 %stenosed. Prox LAD to Mid LAD lesion, 40 %stenosed. Ost LM to LM lesion, 99 %stenosed.   1. Severe distal left main stenosis 2. Severe mid LAD stenosis 3. 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  4.  Moderately severe, calcified stenosis of the mid RCA 5. Elevated filling pressures.    11/2016 echo Mid RCA lesion, 60 %stenosed. Prox RCA lesion, 40 %stenosed. Ost RPDA to RPDA lesion, 90 %stenosed. Ost Cx to Prox Cx lesion, 100 %stenosed. Mid LAD lesion, 80 %stenosed. Ost 2nd Diag to  2nd Diag lesion, 40 %stenosed. Prox LAD to Mid LAD lesion, 40 %stenosed. Ost LM to LM lesion, 99 %stenosed.   1. Severe distal left main stenosis 2. Severe mid LAD stenosis 3. 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  4. Moderately severe, calcified stenosis of the mid RCA 5. Elevated filling pressures.    12/2016 echo Study Conclusions   - HPI and indications: Limited for LV function & Rule out effusion. - Left ventricle: Systolic function was mildly reduced. The   estimated ejection fraction was in the range of 45% to 50%. - Aortic valve: Mildly calcified leaflets. Mild to moderate   st[place]. Mean gradient (S): 17 mm Hg. Peak gradient (S): 28 mm   Hg. Valve area (VTI): 1.01 cm^2. Valve area (Vmax): 1.1 cm^2.   Valve area (Vmean): 1.02 cm^2.   Impressions:   - Compared to the recent study on 12/12/16, the LVEF is unchaged at   45-50%. There is mild to moderate aortic stenosis. No pericardial   effusion is noted.   11/2017 echo Study Conclusions   - Left ventricle: The cavity size was normal. There was mild    concentric hypertrophy. Systolic function was normal. Wall motion    was normal; there were no regional wall motion abnormalities.    Doppler parameters are consistent with abnormal left ventricular    relaxation (grade 1 diastolic dysfunction). Doppler parameters    are consistent with high ventricular filling pressure.  - Aortic valve: Valve mobility was restricted. There was mild    stenosis. There was mild regurgitation. Peak velocity (S): 288    cm/s. Mean gradient (S): 17 mm Hg. Valve area (VTI): 0.83 cm^2.    Valve  area (Vmax): 0.85 cm^2. Valve area (Vmean): 0.87 cm^2.  - Mitral valve: Transvalvular velocity was within the normal range.    There was no evidence for stenosis. There was trivial    regurgitation.  - Right ventricle: The cavity size was normal. Wall thickness was    normal. Systolic function was normal.  - Atrial septum: No defect or patent foramen ovale was identified.  - Tricuspid valve: There was mild regurgitation.  - Pulmonary arteries: PA peak pressure: 27 mm Hg (S).      10/2018 nuclear stress The left ventricular ejection fraction is normal (55-65%). Nuclear stress EF: 56%. There was no ST segment deviation noted during stress. The study is normal. This is a low risk study.     04/2020 echo IMPRESSIONS     1. Basal inferior akinesis. Left ventricular ejection fraction, by  estimation, is 55 to 60%. The left ventricle has normal function. Left  ventricular diastolic parameters are indeterminate.   2. Right ventricular systolic function is low normal. The right  ventricular size is mildly enlarged.   3. Trivial mitral valve regurgitation.   4. AV is thickened, calcified with restricted motion. Peak and mean  gradients through the valve are 68 and 43 mm Hg respectively Dimensionless  index is 0.23 consistent with severe AS. Compared to echo from 9.19.21,  gradients are increased. The aortic  valve is tricuspid. Aortic valve regurgitation is not visualized.   5. The inferior vena cava is normal in size with greater than 50%  respiratory variability, suggesting right atrial pressure of 3 mmHg.      05/2020 cath Ost LM to LM lesion is 99% stenosed. Ost 2nd Diag to 2nd Diag lesion is 40% stenosed. Mid LAD lesion is 30%  stenosed. Prox LAD to Mid LAD lesion is 40% stenosed. Ost Cx to Prox Cx lesion is 100% stenosed. Mid RCA lesion is 30% stenosed. Prox RCA lesion is 80% stenosed. Ost RPDA to RPDA lesion is 90% stenosed. Dist RCA lesion is 50% stenosed. 2nd Mrg lesion is  50% stenosed. Mid LM to Prox LAD lesion is 95% stenosed. Mid Graft to Insertion lesion is 100% stenosed.   1. Severe left main stenosis 2. Severe ostial LAD stenosis. Patent Vein graft to the Diagonal Abdulla Pooley that fills the LAD. The LIMA in injected and does not fill the LAD although it may just be atretic distally. It is not clear that the LIMA was anastomosed to the LAD. The operative note indicates the LIMA was used.  3. Chronic occlusion ostial Circumflex. Patent vein graft to the obtuse marginal Muhammad Vacca with moderate disease in the obtuse marginal Kayron Kalmar.  4. The RCA is a large dominant vessel. There is a heavily calcified proximal stenosis that appears to be severe angiographically. RFR with flow wire was 0.89 suggesting the lesion is flow limiting.  5. Severe aortic stenosis (mean gradient 40.3 mmHg, peak to peak gradient 55 mmHg, AVA 0.75 cm2)   Recommendations: She will need to have staged PCI of the RCA with orbital atherectomy and stenting. I will plan this for next week. After the PCI, will then continue planning for TAVR with CT scans.    Assessment and Plan  1. Severe aortic stenosis s/p TAVR - no symptoms, continue to monitor   2. CAD - denies symptoms,she will continue current meds   3. Chronic HFpEF - euvolemic without symptoms. Actually has lost 10 lbs since last visit with dietary changes and increased activity, dry weight around 140 lbs now and stable.  - conitnue curren tmeds   4. . Hyperlipidemia -she is at goal, continue current meds   F/u 6 months   Arnoldo Lenis, M.D.

## 2022-02-03 DIAGNOSIS — R5383 Other fatigue: Secondary | ICD-10-CM | POA: Diagnosis not present

## 2022-02-03 DIAGNOSIS — J449 Chronic obstructive pulmonary disease, unspecified: Secondary | ICD-10-CM | POA: Diagnosis not present

## 2022-02-03 DIAGNOSIS — F419 Anxiety disorder, unspecified: Secondary | ICD-10-CM | POA: Diagnosis not present

## 2022-02-22 ENCOUNTER — Other Ambulatory Visit: Payer: Self-pay | Admitting: Cardiology

## 2022-03-12 ENCOUNTER — Telehealth: Payer: Medicare HMO | Admitting: Thoracic Surgery (Cardiothoracic Vascular Surgery)

## 2022-03-12 ENCOUNTER — Other Ambulatory Visit: Payer: Medicare HMO

## 2022-03-28 ENCOUNTER — Encounter (HOSPITAL_COMMUNITY): Payer: Self-pay | Admitting: Emergency Medicine

## 2022-03-28 ENCOUNTER — Encounter (HOSPITAL_COMMUNITY): Payer: Self-pay

## 2022-03-28 ENCOUNTER — Inpatient Hospital Stay (HOSPITAL_COMMUNITY): Payer: Medicare HMO

## 2022-03-28 ENCOUNTER — Inpatient Hospital Stay (HOSPITAL_COMMUNITY)
Admission: EM | Admit: 2022-03-28 | Discharge: 2022-04-06 | DRG: 871 | Disposition: A | Discharge status: Home Health | Payer: Medicare HMO | Attending: Internal Medicine | Admitting: Internal Medicine

## 2022-03-28 ENCOUNTER — Other Ambulatory Visit: Payer: Self-pay

## 2022-03-28 ENCOUNTER — Emergency Department (HOSPITAL_COMMUNITY): Payer: Medicare HMO

## 2022-03-28 DIAGNOSIS — J154 Pneumonia due to other streptococci: Secondary | ICD-10-CM | POA: Diagnosis not present

## 2022-03-28 DIAGNOSIS — R55 Syncope and collapse: Secondary | ICD-10-CM | POA: Diagnosis not present

## 2022-03-28 DIAGNOSIS — Z952 Presence of prosthetic heart valve: Secondary | ICD-10-CM | POA: Diagnosis not present

## 2022-03-28 DIAGNOSIS — J90 Pleural effusion, not elsewhere classified: Secondary | ICD-10-CM | POA: Diagnosis not present

## 2022-03-28 DIAGNOSIS — Z809 Family history of malignant neoplasm, unspecified: Secondary | ICD-10-CM

## 2022-03-28 DIAGNOSIS — J9690 Respiratory failure, unspecified, unspecified whether with hypoxia or hypercapnia: Secondary | ICD-10-CM | POA: Diagnosis not present

## 2022-03-28 DIAGNOSIS — I4891 Unspecified atrial fibrillation: Secondary | ICD-10-CM | POA: Diagnosis present

## 2022-03-28 DIAGNOSIS — I471 Supraventricular tachycardia, unspecified: Secondary | ICD-10-CM | POA: Diagnosis not present

## 2022-03-28 DIAGNOSIS — I5032 Chronic diastolic (congestive) heart failure: Secondary | ICD-10-CM | POA: Diagnosis present

## 2022-03-28 DIAGNOSIS — B9729 Other coronavirus as the cause of diseases classified elsewhere: Secondary | ICD-10-CM | POA: Diagnosis present

## 2022-03-28 DIAGNOSIS — G9341 Metabolic encephalopathy: Secondary | ICD-10-CM | POA: Diagnosis not present

## 2022-03-28 DIAGNOSIS — J189 Pneumonia, unspecified organism: Secondary | ICD-10-CM

## 2022-03-28 DIAGNOSIS — Z7982 Long term (current) use of aspirin: Secondary | ICD-10-CM

## 2022-03-28 DIAGNOSIS — J9611 Chronic respiratory failure with hypoxia: Secondary | ICD-10-CM | POA: Diagnosis present

## 2022-03-28 DIAGNOSIS — I739 Peripheral vascular disease, unspecified: Secondary | ICD-10-CM | POA: Diagnosis present

## 2022-03-28 DIAGNOSIS — N179 Acute kidney failure, unspecified: Secondary | ICD-10-CM | POA: Diagnosis present

## 2022-03-28 DIAGNOSIS — E876 Hypokalemia: Secondary | ICD-10-CM | POA: Diagnosis present

## 2022-03-28 DIAGNOSIS — R6521 Severe sepsis with septic shock: Secondary | ICD-10-CM

## 2022-03-28 DIAGNOSIS — I2489 Other forms of acute ischemic heart disease: Secondary | ICD-10-CM | POA: Diagnosis present

## 2022-03-28 DIAGNOSIS — R059 Cough, unspecified: Secondary | ICD-10-CM | POA: Diagnosis not present

## 2022-03-28 DIAGNOSIS — I110 Hypertensive heart disease with heart failure: Secondary | ICD-10-CM | POA: Diagnosis present

## 2022-03-28 DIAGNOSIS — F05 Delirium due to known physiological condition: Secondary | ICD-10-CM | POA: Diagnosis not present

## 2022-03-28 DIAGNOSIS — Z951 Presence of aortocoronary bypass graft: Secondary | ICD-10-CM

## 2022-03-28 DIAGNOSIS — J9602 Acute respiratory failure with hypercapnia: Secondary | ICD-10-CM | POA: Diagnosis present

## 2022-03-28 DIAGNOSIS — J80 Acute respiratory distress syndrome: Secondary | ICD-10-CM | POA: Diagnosis present

## 2022-03-28 DIAGNOSIS — E875 Hyperkalemia: Secondary | ICD-10-CM | POA: Diagnosis not present

## 2022-03-28 DIAGNOSIS — I252 Old myocardial infarction: Secondary | ICD-10-CM

## 2022-03-28 DIAGNOSIS — K219 Gastro-esophageal reflux disease without esophagitis: Secondary | ICD-10-CM | POA: Diagnosis present

## 2022-03-28 DIAGNOSIS — Z1152 Encounter for screening for COVID-19: Secondary | ICD-10-CM

## 2022-03-28 DIAGNOSIS — E871 Hypo-osmolality and hyponatremia: Secondary | ICD-10-CM | POA: Diagnosis not present

## 2022-03-28 DIAGNOSIS — R000 Tachycardia, unspecified: Secondary | ICD-10-CM | POA: Diagnosis not present

## 2022-03-28 DIAGNOSIS — K5730 Diverticulosis of large intestine without perforation or abscess without bleeding: Secondary | ICD-10-CM | POA: Diagnosis not present

## 2022-03-28 DIAGNOSIS — Z8249 Family history of ischemic heart disease and other diseases of the circulatory system: Secondary | ICD-10-CM

## 2022-03-28 DIAGNOSIS — J384 Edema of larynx: Secondary | ICD-10-CM | POA: Diagnosis present

## 2022-03-28 DIAGNOSIS — F419 Anxiety disorder, unspecified: Secondary | ICD-10-CM | POA: Diagnosis present

## 2022-03-28 DIAGNOSIS — E7220 Disorder of urea cycle metabolism, unspecified: Secondary | ICD-10-CM | POA: Diagnosis present

## 2022-03-28 DIAGNOSIS — R069 Unspecified abnormalities of breathing: Secondary | ICD-10-CM | POA: Diagnosis not present

## 2022-03-28 DIAGNOSIS — R0902 Hypoxemia: Secondary | ICD-10-CM | POA: Diagnosis not present

## 2022-03-28 DIAGNOSIS — Z9981 Dependence on supplemental oxygen: Secondary | ICD-10-CM | POA: Diagnosis not present

## 2022-03-28 DIAGNOSIS — E873 Alkalosis: Secondary | ICD-10-CM | POA: Diagnosis not present

## 2022-03-28 DIAGNOSIS — I672 Cerebral atherosclerosis: Secondary | ICD-10-CM | POA: Diagnosis not present

## 2022-03-28 DIAGNOSIS — R7401 Elevation of levels of liver transaminase levels: Secondary | ICD-10-CM | POA: Diagnosis present

## 2022-03-28 DIAGNOSIS — J440 Chronic obstructive pulmonary disease with acute lower respiratory infection: Secondary | ICD-10-CM | POA: Diagnosis present

## 2022-03-28 DIAGNOSIS — J9601 Acute respiratory failure with hypoxia: Secondary | ICD-10-CM

## 2022-03-28 DIAGNOSIS — R410 Disorientation, unspecified: Secondary | ICD-10-CM | POA: Diagnosis not present

## 2022-03-28 DIAGNOSIS — Z902 Acquired absence of lung [part of]: Secondary | ICD-10-CM

## 2022-03-28 DIAGNOSIS — Z8673 Personal history of transient ischemic attack (TIA), and cerebral infarction without residual deficits: Secondary | ICD-10-CM

## 2022-03-28 DIAGNOSIS — E162 Hypoglycemia, unspecified: Secondary | ICD-10-CM | POA: Diagnosis present

## 2022-03-28 DIAGNOSIS — R7989 Other specified abnormal findings of blood chemistry: Secondary | ICD-10-CM

## 2022-03-28 DIAGNOSIS — F17210 Nicotine dependence, cigarettes, uncomplicated: Secondary | ICD-10-CM | POA: Diagnosis present

## 2022-03-28 DIAGNOSIS — I2510 Atherosclerotic heart disease of native coronary artery without angina pectoris: Secondary | ICD-10-CM | POA: Diagnosis present

## 2022-03-28 DIAGNOSIS — A419 Sepsis, unspecified organism: Principal | ICD-10-CM

## 2022-03-28 DIAGNOSIS — J386 Stenosis of larynx: Secondary | ICD-10-CM | POA: Diagnosis present

## 2022-03-28 DIAGNOSIS — Z79899 Other long term (current) drug therapy: Secondary | ICD-10-CM

## 2022-03-28 DIAGNOSIS — M2669 Other specified disorders of temporomandibular joint: Secondary | ICD-10-CM | POA: Diagnosis not present

## 2022-03-28 DIAGNOSIS — R0603 Acute respiratory distress: Secondary | ICD-10-CM | POA: Diagnosis not present

## 2022-03-28 DIAGNOSIS — J441 Chronic obstructive pulmonary disease with (acute) exacerbation: Secondary | ICD-10-CM | POA: Diagnosis present

## 2022-03-28 DIAGNOSIS — R4182 Altered mental status, unspecified: Secondary | ICD-10-CM | POA: Diagnosis not present

## 2022-03-28 DIAGNOSIS — Z833 Family history of diabetes mellitus: Secondary | ICD-10-CM

## 2022-03-28 DIAGNOSIS — K449 Diaphragmatic hernia without obstruction or gangrene: Secondary | ICD-10-CM | POA: Diagnosis present

## 2022-03-28 DIAGNOSIS — R5381 Other malaise: Secondary | ICD-10-CM | POA: Diagnosis present

## 2022-03-28 DIAGNOSIS — Z7951 Long term (current) use of inhaled steroids: Secondary | ICD-10-CM

## 2022-03-28 DIAGNOSIS — Z85118 Personal history of other malignant neoplasm of bronchus and lung: Secondary | ICD-10-CM

## 2022-03-28 LAB — CBC WITH DIFFERENTIAL/PLATELET
Abs Immature Granulocytes: 0.4 10*3/uL — ABNORMAL HIGH (ref 0.00–0.07)
Basophils Absolute: 0.2 10*3/uL — ABNORMAL HIGH (ref 0.0–0.1)
Basophils Relative: 1 %
Eosinophils Absolute: 0 10*3/uL (ref 0.0–0.5)
Eosinophils Relative: 0 %
HCT: 44.8 % (ref 36.0–46.0)
Hemoglobin: 13.2 g/dL (ref 12.0–15.0)
Immature Granulocytes: 2 %
Lymphocytes Relative: 4 %
Lymphs Abs: 0.9 10*3/uL (ref 0.7–4.0)
MCH: 26.7 pg (ref 26.0–34.0)
MCHC: 29.5 g/dL — ABNORMAL LOW (ref 30.0–36.0)
MCV: 90.5 fL (ref 80.0–100.0)
Monocytes Absolute: 1.7 10*3/uL — ABNORMAL HIGH (ref 0.1–1.0)
Monocytes Relative: 8 %
Neutro Abs: 17.6 10*3/uL — ABNORMAL HIGH (ref 1.7–7.7)
Neutrophils Relative %: 85 %
Platelets: 199 10*3/uL (ref 150–400)
RBC: 4.95 MIL/uL (ref 3.87–5.11)
RDW: 16.5 % — ABNORMAL HIGH (ref 11.5–15.5)
WBC: 20.7 10*3/uL — ABNORMAL HIGH (ref 4.0–10.5)
nRBC: 0.1 % (ref 0.0–0.2)

## 2022-03-28 LAB — RESP PANEL BY RT-PCR (RSV, FLU A&B, COVID)  RVPGX2
Influenza A by PCR: NEGATIVE
Influenza B by PCR: NEGATIVE
Resp Syncytial Virus by PCR: NEGATIVE
SARS Coronavirus 2 by RT PCR: NEGATIVE

## 2022-03-28 LAB — URINALYSIS, W/ REFLEX TO CULTURE (INFECTION SUSPECTED)
Bilirubin Urine: NEGATIVE
Glucose, UA: NEGATIVE mg/dL
Hgb urine dipstick: NEGATIVE
Ketones, ur: 5 mg/dL — AB
Leukocytes,Ua: NEGATIVE
Nitrite: NEGATIVE
Protein, ur: 30 mg/dL — AB
Specific Gravity, Urine: 1.014 (ref 1.005–1.030)
pH: 6 (ref 5.0–8.0)

## 2022-03-28 LAB — BLOOD GAS, ARTERIAL
Acid-Base Excess: 7.8 mmol/L — ABNORMAL HIGH (ref 0.0–2.0)
Acid-base deficit: 8.7 mmol/L — ABNORMAL HIGH (ref 0.0–2.0)
Bicarbonate: 19.5 mmol/L — ABNORMAL LOW (ref 20.0–28.0)
Bicarbonate: 33.3 mmol/L — ABNORMAL HIGH (ref 20.0–28.0)
Drawn by: 27016
FIO2: 100 %
FIO2: 50 %
O2 Saturation: 86.6 %
O2 Saturation: 100 %
Patient temperature: 38.6
Patient temperature: 38
pCO2 arterial: 54 mmHg — ABNORMAL HIGH (ref 32–48)
pCO2 arterial: 51 mmHg — ABNORMAL HIGH (ref 32–48)
pH, Arterial: 7.18 — CL (ref 7.35–7.45)
pH, Arterial: 7.43 (ref 7.35–7.45)
pO2, Arterial: 60 mmHg — ABNORMAL LOW (ref 83–108)
pO2, Arterial: 138 mmHg — ABNORMAL HIGH (ref 83–108)

## 2022-03-28 LAB — I-STAT CHEM 8, ED
BUN: 49 mg/dL — ABNORMAL HIGH (ref 8–23)
Calcium, Ion: 1.2 mmol/L (ref 1.15–1.40)
Chloride: 96 mmol/L — ABNORMAL LOW (ref 98–111)
Creatinine, Ser: 0.8 mg/dL (ref 0.44–1.00)
Glucose, Bld: 87 mg/dL (ref 70–99)
HCT: 45 % (ref 36.0–46.0)
Hemoglobin: 15.3 g/dL — ABNORMAL HIGH (ref 12.0–15.0)
Potassium: 6.3 mmol/L (ref 3.5–5.1)
Sodium: 134 mmol/L — ABNORMAL LOW (ref 135–145)
TCO2: 34 mmol/L — ABNORMAL HIGH (ref 22–32)

## 2022-03-28 LAB — POCT I-STAT 7, (LYTES, BLD GAS, ICA,H+H)
Acid-Base Excess: 5 mmol/L — ABNORMAL HIGH (ref 0.0–2.0)
Bicarbonate: 30.5 mmol/L — ABNORMAL HIGH (ref 20.0–28.0)
Calcium, Ion: 1.21 mmol/L (ref 1.15–1.40)
HCT: 34 % — ABNORMAL LOW (ref 36.0–46.0)
Hemoglobin: 11.6 g/dL — ABNORMAL LOW (ref 12.0–15.0)
O2 Saturation: 98 %
Patient temperature: 100.6
Potassium: 4.6 mmol/L (ref 3.5–5.1)
Sodium: 136 mmol/L (ref 135–145)
TCO2: 32 mmol/L (ref 22–32)
pCO2 arterial: 51.1 mmHg — ABNORMAL HIGH (ref 32–48)
pH, Arterial: 7.389 (ref 7.35–7.45)
pO2, Arterial: 117 mmHg — ABNORMAL HIGH (ref 83–108)

## 2022-03-28 LAB — COMPREHENSIVE METABOLIC PANEL
ALT: 166 U/L — ABNORMAL HIGH (ref 0–44)
AST: 163 U/L — ABNORMAL HIGH (ref 15–41)
Albumin: 3.3 g/dL — ABNORMAL LOW (ref 3.5–5.0)
Alkaline Phosphatase: 147 U/L — ABNORMAL HIGH (ref 38–126)
Anion gap: 12 (ref 5–15)
BUN: 38 mg/dL — ABNORMAL HIGH (ref 8–23)
CO2: 29 mmol/L (ref 22–32)
Calcium: 8.8 mg/dL — ABNORMAL LOW (ref 8.9–10.3)
Chloride: 94 mmol/L — ABNORMAL LOW (ref 98–111)
Creatinine, Ser: 0.83 mg/dL (ref 0.44–1.00)
GFR, Estimated: >60 mL/min (ref >60)
Glucose, Bld: 87 mg/dL (ref 70–99)
Potassium: 6.1 mmol/L — ABNORMAL HIGH (ref 3.5–5.1)
Sodium: 135 mmol/L (ref 135–145)
Total Bilirubin: 1.9 mg/dL — ABNORMAL HIGH (ref 0.3–1.2)
Total Protein: 7.3 g/dL (ref 6.5–8.1)

## 2022-03-28 LAB — PROTIME-INR
INR: 1.2 (ref 0.8–1.2)
Prothrombin Time: 14.9 seconds (ref 11.4–15.2)

## 2022-03-28 LAB — AMMONIA: Ammonia: 39 umol/L — ABNORMAL HIGH (ref 9–35)

## 2022-03-28 LAB — GLUCOSE, CAPILLARY: Glucose-Capillary: 99 mg/dL (ref 70–99)

## 2022-03-28 LAB — BASIC METABOLIC PANEL
Anion gap: 9 (ref 5–15)
BUN: 31 mg/dL — ABNORMAL HIGH (ref 8–23)
CO2: 29 mmol/L (ref 22–32)
Calcium: 8 mg/dL — ABNORMAL LOW (ref 8.9–10.3)
Chloride: 96 mmol/L — ABNORMAL LOW (ref 98–111)
Creatinine, Ser: 0.65 mg/dL (ref 0.44–1.00)
GFR, Estimated: >60 mL/min (ref >60)
Glucose, Bld: 93 mg/dL (ref 70–99)
Potassium: 4.5 mmol/L (ref 3.5–5.1)
Sodium: 134 mmol/L — ABNORMAL LOW (ref 135–145)

## 2022-03-28 LAB — LACTIC ACID, PLASMA
Lactic Acid, Venous: 0.8 mmol/L (ref 0.5–1.9)
Lactic Acid, Venous: 2 mmol/L (ref 0.5–1.9)
Lactic Acid, Venous: 1.3 mmol/L (ref 0.5–1.9)

## 2022-03-28 LAB — ETHANOL: Alcohol, Ethyl (B): <10 mg/dL (ref <10)

## 2022-03-28 LAB — TROPONIN I (HIGH SENSITIVITY): Troponin I (High Sensitivity): 1184 ng/L (ref <18)

## 2022-03-28 LAB — CBG MONITORING, ED: Glucose-Capillary: 87 mg/dL (ref 70–99)

## 2022-03-28 LAB — PROCALCITONIN: Procalcitonin: 1.48 ng/mL

## 2022-03-28 LAB — BRAIN NATRIURETIC PEPTIDE: B Natriuretic Peptide: 1182.4 pg/mL — ABNORMAL HIGH (ref 0.0–100.0)

## 2022-03-28 LAB — MRSA NEXT GEN BY PCR, NASAL: MRSA by PCR Next Gen: DETECTED — AB

## 2022-03-28 LAB — APTT: aPTT: 33 seconds (ref 24–36)

## 2022-03-28 MED ORDER — CALCIUM GLUCONATE-NACL 1-0.675 GM/50ML-% IV SOLN: 1.0000 g | Freq: Once | INTRAVENOUS | Status: DC

## 2022-03-28 MED ORDER — ASPIRIN 300 MG RE SUPP
300.0000 mg | Freq: Once | RECTAL | Status: AC
  Administered 2022-03-28: 300 mg via RECTAL
  Filled 2022-03-28: qty 1

## 2022-03-28 MED ORDER — FENTANYL CITRATE PF 50 MCG/ML IJ SOSY: 25.0000 ug | PREFILLED_SYRINGE | INTRAMUSCULAR | Status: DC | PRN

## 2022-03-28 MED ORDER — SODIUM ZIRCONIUM CYCLOSILICATE 5 G PO PACK: 10.0000 g | PACK | Freq: Once | ORAL | Status: DC

## 2022-03-28 MED ORDER — DOCUSATE SODIUM 50 MG/5ML PO LIQD: 100.0000 mg | Freq: Two times a day (BID) | ORAL | Status: DC | PRN

## 2022-03-28 MED ORDER — PROPOFOL 1000 MG/100ML IV EMUL
INTRAVENOUS | Status: AC
  Administered 2022-03-28: 5 ug/kg/min via INTRAVENOUS
  Filled 2022-03-28: qty 100

## 2022-03-28 MED ORDER — METHYLPREDNISOLONE SODIUM SUCC 125 MG IJ SOLR
125.0000 mg | Freq: Once | INTRAMUSCULAR | Status: AC
  Administered 2022-03-28: 125 mg via INTRAVENOUS
  Filled 2022-03-28: qty 2

## 2022-03-28 MED ORDER — MAGNESIUM SULFATE 2 GM/50ML IV SOLN
2.0000 g | Freq: Once | INTRAVENOUS | Status: AC
  Administered 2022-03-28: 2 g via INTRAVENOUS
  Filled 2022-03-28: qty 50

## 2022-03-28 MED ORDER — POLYETHYLENE GLYCOL 3350 17 G PO PACK: 17.0000 g | PACK | Freq: Every day | ORAL | Status: DC | PRN

## 2022-03-28 MED ORDER — FENTANYL CITRATE PF 50 MCG/ML IJ SOSY
25.0000 ug | PREFILLED_SYRINGE | INTRAMUSCULAR | Status: DC | PRN
  Administered 2022-03-28 – 2022-03-29 (×2): 25 ug via INTRAVENOUS
  Filled 2022-03-28: qty 1

## 2022-03-28 MED ORDER — PANTOPRAZOLE SODIUM 40 MG IV SOLR
40.0000 mg | Freq: Every day | INTRAVENOUS | Status: DC
  Administered 2022-03-28 – 2022-03-29 (×2): 40 mg via INTRAVENOUS
  Filled 2022-03-28 (×2): qty 10

## 2022-03-28 MED ORDER — SODIUM CHLORIDE 0.9 % IV SOLN
2.0000 g | INTRAVENOUS | Status: DC
  Administered 2022-03-28 – 2022-03-29 (×2): 2 g via INTRAVENOUS
  Filled 2022-03-28 (×2): qty 20

## 2022-03-28 MED ORDER — INSULIN ASPART 100 UNIT/ML IV SOLN
10.0000 [IU] | Freq: Once | INTRAVENOUS | Status: AC
  Administered 2022-03-28: 10 [IU] via INTRAVENOUS

## 2022-03-28 MED ORDER — IPRATROPIUM-ALBUTEROL 0.5-2.5 (3) MG/3ML IN SOLN
3.0000 mL | RESPIRATORY_TRACT | Status: DC | PRN
  Filled 2022-03-28: qty 3

## 2022-03-28 MED ORDER — LACTATED RINGERS IV BOLUS (SEPSIS)
1000.0000 mL | Freq: Once | INTRAVENOUS | Status: AC
  Administered 2022-03-28: 1000 mL via INTRAVENOUS

## 2022-03-28 MED ORDER — POLYETHYLENE GLYCOL 3350 17 G PO PACK
17.0000 g | PACK | Freq: Every day | ORAL | Status: DC
  Administered 2022-03-29: 17 g
  Filled 2022-03-28: qty 1

## 2022-03-28 MED ORDER — DOCUSATE SODIUM 50 MG/5ML PO LIQD
100.0000 mg | Freq: Two times a day (BID) | ORAL | Status: DC
  Administered 2022-03-28 – 2022-03-29 (×2): 100 mg
  Filled 2022-03-28 (×2): qty 10

## 2022-03-28 MED ORDER — SODIUM CHLORIDE 0.9 % IV SOLN
250.0000 mL | INTRAVENOUS | Status: DC
  Administered 2022-03-29: 250 mL via INTRAVENOUS

## 2022-03-28 MED ORDER — PROPOFOL 1000 MG/100ML IV EMUL
0.0000 ug/kg/min | INTRAVENOUS | Status: DC
  Administered 2022-03-28 – 2022-03-29 (×2): 40 ug/kg/min via INTRAVENOUS
  Filled 2022-03-28 (×2): qty 100

## 2022-03-28 MED ORDER — FENTANYL CITRATE PF 50 MCG/ML IJ SOSY
25.0000 ug | PREFILLED_SYRINGE | INTRAMUSCULAR | Status: DC | PRN
  Administered 2022-03-28 (×2): 25 ug via INTRAVENOUS
  Filled 2022-03-28 (×2): qty 1

## 2022-03-28 MED ORDER — PROPOFOL 1000 MG/100ML IV EMUL
0.0000 ug/kg/min | INTRAVENOUS | Status: DC
  Administered 2022-03-28: 15 ug/kg/min via INTRAVENOUS

## 2022-03-28 MED ORDER — SODIUM CHLORIDE 0.9 % IV SOLN
250.0000 mL | INTRAVENOUS | Status: DC
  Administered 2022-03-28: 250 mL via INTRAVENOUS

## 2022-03-28 MED ORDER — REVEFENACIN 175 MCG/3ML IN SOLN
175.0000 ug | Freq: Every day | RESPIRATORY_TRACT | Status: DC
  Filled 2022-03-28: qty 3

## 2022-03-28 MED ORDER — METHYLPREDNISOLONE SODIUM SUCC 40 MG IJ SOLR
40.0000 mg | Freq: Two times a day (BID) | INTRAMUSCULAR | Status: DC
  Administered 2022-03-29 – 2022-03-30 (×4): 40 mg via INTRAVENOUS
  Filled 2022-03-28 (×4): qty 1

## 2022-03-28 MED ORDER — PROPOFOL 1000 MG/100ML IV EMUL: 0.0000 ug/kg/min | INTRAVENOUS | Status: DC

## 2022-03-28 MED ORDER — BUDESONIDE 0.5 MG/2ML IN SUSP
0.5000 mg | Freq: Two times a day (BID) | RESPIRATORY_TRACT | Status: DC
  Administered 2022-03-28 – 2022-04-06 (×17): 0.5 mg via RESPIRATORY_TRACT
  Filled 2022-03-28 (×19): qty 2

## 2022-03-28 MED ORDER — LACTATED RINGERS IV SOLN: INTRAVENOUS | Status: DC

## 2022-03-28 MED ORDER — ACETAMINOPHEN 650 MG RE SUPP
650.0000 mg | Freq: Once | RECTAL | Status: AC
  Administered 2022-03-28: 650 mg via RECTAL
  Filled 2022-03-28: qty 1

## 2022-03-28 MED ORDER — ARFORMOTEROL TARTRATE 15 MCG/2ML IN NEBU
15.0000 ug | INHALATION_SOLUTION | Freq: Two times a day (BID) | RESPIRATORY_TRACT | Status: DC
  Administered 2022-03-28: 15 ug via RESPIRATORY_TRACT
  Filled 2022-03-28: qty 2

## 2022-03-28 MED ORDER — DEXTROSE 50 % IV SOLN
1.0000 | Freq: Once | INTRAVENOUS | Status: AC
  Administered 2022-03-28: 50 mL via INTRAVENOUS
  Filled 2022-03-28: qty 50

## 2022-03-28 MED ORDER — IOHEXOL 300 MG/ML  SOLN
100.0000 mL | Freq: Once | INTRAMUSCULAR | Status: AC | PRN
  Administered 2022-03-28: 100 mL via INTRAVENOUS

## 2022-03-28 MED ORDER — NOREPINEPHRINE 4 MG/250ML-% IV SOLN
2.0000 ug/min | INTRAVENOUS | Status: DC
  Administered 2022-03-28: 2 ug/min via INTRAVENOUS
  Filled 2022-03-28: qty 250

## 2022-03-28 MED ORDER — SODIUM CHLORIDE 0.9 % IV SOLN
500.0000 mg | INTRAVENOUS | Status: DC
  Administered 2022-03-28 – 2022-03-29 (×2): 500 mg via INTRAVENOUS
  Filled 2022-03-28 (×2): qty 5

## 2022-03-28 MED ORDER — NOREPINEPHRINE 4 MG/250ML-% IV SOLN: 2.0000 ug/min | INTRAVENOUS | Status: DC

## 2022-03-28 MED ORDER — DOCUSATE SODIUM 100 MG PO CAPS: 100.0000 mg | ORAL_CAPSULE | Freq: Two times a day (BID) | ORAL | Status: DC | PRN

## 2022-03-28 MED ORDER — ACETAMINOPHEN 325 MG PO TABS
650.0000 mg | ORAL_TABLET | ORAL | Status: DC | PRN
  Administered 2022-03-28: 650 mg
  Filled 2022-03-28: qty 2

## 2022-03-28 MED ORDER — SODIUM BICARBONATE 8.4 % IV SOLN
50.0000 meq | Freq: Once | INTRAVENOUS | Status: AC
  Administered 2022-03-28: 50 meq via INTRAVENOUS
  Filled 2022-03-28: qty 50

## 2022-03-28 MED ORDER — IPRATROPIUM-ALBUTEROL 0.5-2.5 (3) MG/3ML IN SOLN
3.0000 mL | RESPIRATORY_TRACT | Status: DC
  Administered 2022-03-28 – 2022-03-29 (×4): 3 mL via RESPIRATORY_TRACT
  Filled 2022-03-28 (×3): qty 3

## 2022-03-28 NOTE — ED Provider Notes (Signed)
[facility] Provider Note   CSN: [id number] Arrival date & time: 03/28/22  1535     History  Chief Complaint  Patient presents with   Altered Mental Status    Emily Rocha is a 67 y.o. female.  Pt is a 67 yo female with pmhx significant for COPD, GERD, CAD, Asthma, CVA, PVC, lung cancer, and aortic stenosis s/p TAVR.  Pt presents to the ED obtunded.  EMS is not able to provide much history.  They did say her O2 sat was low and they put her on a NRB.  She was minimally responsive for them.   Pt is unable to give any hx.    Husband and family now here.  Pt has been sick since the 7th.  She has been having to use supplemental oxygen at home.  Pt does not normally need it.  Pt did not want to go to the hospital.  Yesterday and last night, pt was minimally responsive and would occasionally say things "out of her head."  Today, she was worse, so husband called EMS.       Home Medications Prior to Admission medications   Medication Sig Start Date End Date Taking? Authorizing Provider  albuterol (PROVENTIL) (2.5 MG/3ML) 0.083% nebulizer solution Take 2.5 mg by nebulization every 4 (four) hours as needed for wheezing or shortness of breath. 03/30/19   [provider]  albuterol (VENTOLIN HFA) 108 (90 Base) MCG/ACT inhaler Inhale 1-2 puffs into the lungs every 6 (six) hours as needed for wheezing or shortness of breath.    [provider]  ALPRAZolam Duanne Moron) 0.5 MG tablet Take 0.5 mg by mouth 3 (three) times daily.    [provider]  Ascorbic Acid (VITAMIN C GUMMIE PO) Chew 500 mg by mouth daily.    [provider]  aspirin EC 81 MG tablet Take 1 tablet (81 mg total) by mouth daily. Swallow whole. 12/21/19   Strader, Fransisco Hertz, PA-C  atorvastatin (LIPITOR) 40 MG tablet Take 1 tablet (40 mg total) daily by mouth. 12/27/16   Gold, Wilder Glade, PA-C  cholecalciferol (VITAMIN D3) 25 MCG (1000 UNIT) tablet Take 1,000  Units by mouth in the morning.    [provider]  diphenhydramine-acetaminophen (TYLENOL PM) 25-500 MG TABS tablet Take 2 tablets by mouth at bedtime.    [provider]  magnesium oxide (MAG-OX) 400 (240 Mg) MG tablet Take 400 mg by mouth daily.    [provider]  metoprolol succinate (TOPROL-XL) 50 MG 24 hr tablet TAKE ONE TABLET (50MG  TOTAL) BY MOUTH DAILY WITH OR IMMEDIATELY FOLLOWING A MEAL 02/22/22   Arnoldo Lenis, MD  Multiple Vitamin (MULTIVITAMIN WITH MINERALS) TABS tablet Take 1 tablet by mouth daily. 12/01/18   Nani Skillern, PA-C  pantoprazole (PROTONIX) 40 MG tablet Take 40 mg by mouth in the morning.    [provider]  potassium chloride SA (KLOR-CON) 20 MEQ tablet Take 20 mEq by mouth in the morning.    [provider]  Tiotropium Bromide-Olodaterol (STIOLTO RESPIMAT) 2.5-2.5 MCG/ACT AERS Inhale 2 puffs into the lungs daily. Patient not taking: Reported on 01/25/2022 02/07/20   Parrett, Fonnie Mu, NP  torsemide (DEMADEX) 20 MG tablet Take 40 mg by mouth See admin instructions. Take 2 tablets (40 mg) by mouth scheduled on Mondays, Tuesdays, Wednesdays, Thursdays, Fridays & Saturdays. (Hold dose on Sundays)    [provider]  Donnal Debar 100-62.5-25 MCG/ACT AEPB SMARTSIG:1 Inhalation  Via Inhaler Daily 01/04/22   [provider]      Allergies    Patient has no known allergies.    Review of Systems   Review of Systems  Unable to perform ROS: Mental status change    Physical Exam Updated Vital Signs BP 111/88   Pulse 84   Temp (!) 100.6 F (38.1 C)   Resp 18   Ht 5\' 4"  (1.626 m)   Wt 63.5 kg   SpO2 100%   BMI 24.03 kg/m  Physical Exam Vitals and nursing note reviewed.  Constitutional:      General: She is in acute distress.     Appearance: She is ill-appearing and toxic-appearing.     Interventions: Face mask in place.  HENT:     Head: Normocephalic and atraumatic.     Right Ear:  External ear normal.     Left Ear: External ear normal.     Nose: Nose normal.     Mouth/Throat:     Mouth: Mucous membranes are dry.  Eyes:     Pupils: Pupils are equal, round, and reactive to light.  Cardiovascular:     Rate and Rhythm: Regular rhythm. Tachycardia present.     Pulses: Normal pulses.     Heart sounds: Normal heart sounds.  Pulmonary:     Effort: Respiratory distress present.     Breath sounds: Rhonchi present.  Abdominal:     General: Abdomen is flat. Bowel sounds are normal.     Palpations: Abdomen is soft.  Musculoskeletal:        General: Normal range of motion.  Lymphadenopathy:     Cervical: No cervical adenopathy.  Skin:    General: Skin is warm.     Capillary Refill: Capillary refill takes 2 to 3 seconds.  Neurological:     Mental Status: She is unresponsive.  Psychiatric:     Comments: Unable to assess     ED Results / Procedures / Treatments   Labs (all labs ordered are listed, but only abnormal results are displayed) Labs Reviewed  LACTIC ACID, PLASMA - Abnormal; Notable for the following components:      Result Value   Lactic Acid, Venous 2.0 (*)    All other components within normal limits  COMPREHENSIVE METABOLIC PANEL - Abnormal; Notable for the following components:   Potassium 6.1 (*)    Chloride 94 (*)    BUN 38 (*)    Calcium 8.8 (*)    Albumin 3.3 (*)    AST 163 (*)    ALT 166 (*)    Alkaline Phosphatase 147 (*)    Total Bilirubin 1.9 (*)    All other components within normal limits  CBC WITH DIFFERENTIAL/PLATELET - Abnormal; Notable for the following components:   WBC 20.7 (*)    MCHC 29.5 (*)    RDW 16.5 (*)    Neutro Abs 17.6 (*)    Monocytes Absolute 1.7 (*)    Basophils Absolute 0.2 (*)    Abs Immature Granulocytes 0.40 (*)    All other components within normal limits  URINALYSIS, W/ REFLEX TO CULTURE (INFECTION SUSPECTED) - Abnormal; Notable for the following components:   Ketones, ur 5 (*)    Protein, ur 30 (*)     Bacteria, UA RARE (*)    All other components within normal limits  BLOOD GAS, ARTERIAL - Abnormal; Notable for the following components:   pH, Arterial 7.18 (*)    pCO2 arterial 54 (*)  pO2, Arterial 60 (*)    Bicarbonate 19.5 (*)    Acid-base deficit 8.7 (*)    All other components within normal limits  BASIC METABOLIC PANEL - Abnormal; Notable for the following components:   Sodium 134 (*)    Chloride 96 (*)    BUN 31 (*)    Calcium 8.0 (*)    All other components within normal limits  I-STAT CHEM 8, ED - Abnormal; Notable for the following components:   Sodium 134 (*)    Potassium 6.3 (*)    Chloride 96 (*)    BUN 49 (*)    TCO2 34 (*)    Hemoglobin 15.3 (*)    All other components within normal limits  TROPONIN I (HIGH SENSITIVITY) - Abnormal; Notable for the following components:   Troponin I (High Sensitivity) 1,184 (*)    All other components within normal limits  TROPONIN I (HIGH SENSITIVITY) - Abnormal; Notable for the following components:   Troponin I (High Sensitivity) 1,126 (*)    All other components within normal limits  RESP PANEL BY RT-PCR (RSV, FLU A&B, COVID)  RVPGX2  CULTURE, BLOOD (ROUTINE X 2)  CULTURE, BLOOD (ROUTINE X 2)  URINE CULTURE  CULTURE, RESPIRATORY W GRAM STAIN  RESPIRATORY PANEL BY PCR  LACTIC ACID, PLASMA  PROTIME-INR  APTT  TRIGLYCERIDES  BLOOD GAS, ARTERIAL  LEGIONELLA PNEUMOPHILA SEROGP 1 UR AG  STREP PNEUMONIAE URINARY ANTIGEN  PROCALCITONIN  CBG MONITORING, ED    EKG EKG Interpretation  Date/Time:  Sunday March 28 2022 15:43:28 EST Ventricular Rate:  110 PR Interval:  163 QRS Duration: 97 QT Interval:  308 QTC Calculation: 417 R Axis:   132 Text Interpretation: Sinus tachycardia Right axis deviation Nonspecific T abnormalities, lateral leads Baseline wander in lead(s) I III aVL Since last tracing rate faster Confirmed by Isla Pence [phone]) on 03/28/2022 4:10:47 PM  Radiology CT CHEST ABDOMEN PELVIS W  CONTRAST  Result Date: 03/28/2022 CLINICAL DATA:  Sepsis EXAM: CT CHEST, ABDOMEN, AND PELVIS WITH CONTRAST TECHNIQUE: Multidetector CT imaging of the chest, abdomen and pelvis was performed following the standard protocol during bolus administration of intravenous contrast. RADIATION DOSE REDUCTION: This exam was performed according to the departmental dose-optimization program which includes automated exposure control, adjustment of the mA and/or kV according to patient size and/or use of iterative reconstruction technique. CONTRAST:  174mL OMNIPAQUE IOHEXOL 300 MG/ML  SOLN COMPARISON:  CT angio chest 12/12/2016 CT chest 11/02/2019 FINDINGS: CT CHEST FINDINGS Cardiovascular: Normal heart size. No significant pericardial effusion. The thoracic aorta is normal in caliber. Severe atherosclerotic plaque of the thoracic aorta. Four-vessel coronary artery calcifications status post coronary artery bypass graft. Aortic valve replacement. The main pulmonary artery is normal in caliber. No central pulmonary embolus. Mediastinum/Nodes: No enlarged mediastinal, hilar, or axillary lymph nodes. Thyroid gland, trachea, and esophagus demonstrate no significant findings. Small hiatal hernia. Lungs/Pleura: Endotracheal tube with tip terminating 3.5 cm above the carina. Interval development of patchy peribronchovascular consolidative airspace opacities most prominent within bilateral upper lobes. No pulmonary nodule. No pulmonary mass. Bilateral trace pleural effusions. No pneumothorax. Musculoskeletal: Right chest wall catheter with lead noted coursing midline to the left abdomen collimated off view of the turns cranially. Intact sternotomy wires. No chest wall abnormality. No suspicious lytic or blastic osseous lesions. No acute displaced fracture. CT ABDOMEN PELVIS FINDINGS Hepatobiliary: No focal liver abnormality. Status post cholecystectomy. No biliary dilatation. Pancreas: No focal lesion. Normal pancreatic contour. No  surrounding inflammatory changes. No main pancreatic  ductal dilatation. Spleen: Normal in size without focal abnormality. Adrenals/Urinary Tract: No adrenal nodule bilaterally. Bilateral kidneys enhance symmetrically. No hydronephrosis. No hydroureter. The urinary bladder is fully decompressed with Foley tip and balloon terminating within the urinary bladder lumen. Stomach/Bowel: Enteric tube with tip within the gastric lumen and side port within the distal esophagus. Stomach is within normal limits. No evidence of bowel wall thickening or dilatation. Diffuse descending and sigmoid colonic diverticulosis. Appendix appears normal. Vascular/Lymphatic: No abdominal aorta or iliac aneurysm. Severe atherosclerotic plaque of the aorta and its branches. No abdominal, pelvic, or inguinal lymphadenopathy. Reproductive: Calcified lesions within the anterior wall of the uterus consistent with urine fibroids. Otherwise uterus and bilateral adnexa are unremarkable. Other: No intraperitoneal free fluid. No intraperitoneal free gas. No organized fluid collection. Musculoskeletal: No abdominal wall hernia or abnormality. No suspicious lytic or blastic osseous lesions. No acute displaced fracture. IMPRESSION: 1. Multifocal pneumonia. Recommend follow-up CT chest in 3 months to evaluate for complete resolution and exclude underlying lesions. 2. Bilateral trace pleural effusions. 3. Enteric tube with tip within the gastric lumen and side port within the distal esophagus. Recommend advancement by 5 cm. 4. Small hiatal hernia. 5. Colonic diverticulosis with no acute diverticulitis. 6. Uterine fibroids. 7. Aortic Atherosclerosis (ICD10-I70.0) - severe. Electronically Signed   By: Iven Finn M.D.   On: 03/28/2022 19:56   DG Chest Port 1 View  Result Date: 03/28/2022 CLINICAL DATA:  Sepsis EXAM: PORTABLE CHEST 1 VIEW COMPARISON:  08/13/2020 FINDINGS: Endotracheal tube position in the midthoracic trachea. Enteric tube courses below  the diaphragm with distal tip beyond the inferior margin of the film. Stable heart size status post sternotomy and TAVR. Aortic atherosclerosis. Multifocal patchy airspace opacities bilaterally. Small right pleural effusion. No pneumothorax. IMPRESSION: 1. Multifocal patchy airspace opacities bilaterally, suspicious for multifocal pneumonia. 2. Small right pleural effusion. Electronically Signed   By: Davina Poke D.O.   On: 03/28/2022 16:10    Procedures Procedure Name: Intubation Date/Time: 03/28/2022 4:25 PM  Performed by: Isla Pence, MDPre-anesthesia Checklist: Patient identified, Patient being monitored and Emergency Drugs available Oxygen Delivery Method: Ambu bag Preoxygenation: Pre-oxygenation with 100% oxygen Induction Type: Rapid sequence Ventilation: Mask ventilation without difficulty Laryngoscope Size: Glidescope and 3 Tube size: 7.5 mm Number of attempts: 1 Placement Confirmation: ETT inserted through vocal cords under direct vision, Breath sounds checked- equal and bilateral and Positive ETCO2 Secured at: 23 cm Tube secured with: ETT holder Dental Injury: Teeth and Oropharynx as per pre-operative assessment         Medications Ordered in ED Medications  lactated ringers infusion ( Intravenous New Bag/Given 03/28/22 1803)  cefTRIAXone (ROCEPHIN) 2 g in sodium chloride 0.9 % 100 mL IVPB (0 g Intravenous Stopped 03/28/22 1631)  azithromycin (ZITHROMAX) 500 mg in sodium chloride 0.9 % 250 mL IVPB (0 mg Intravenous Stopped 03/28/22 1801)  fentaNYL (SUBLIMAZE) injection 25 mcg (25 mcg Intravenous Given 03/28/22 1910)  fentaNYL (SUBLIMAZE) injection 25-100 mcg (has no administration in time range)  propofol (DIPRIVAN) 1000 MG/100ML infusion (30 mcg/kg/min  63.5 kg Intravenous Rate/Dose Change 03/28/22 1911)  0.9 %  sodium chloride infusion (has no administration in time range)  norepinephrine (LEVOPHED) 4mg  in 224mL (0.016 mg/mL) premix infusion (2 mcg/min Intravenous New  Bag/Given 03/28/22 2015)  budesonide (PULMICORT) nebulizer solution 0.5 mg (has no administration in time range)  arformoterol (BROVANA) nebulizer solution 15 mcg (has no administration in time range)  revefenacin (YUPELRI) nebulizer solution 175 mcg (has no administration in time range)  ipratropium-albuterol (  DUONEB) 0.5-2.5 (3) MG/3ML nebulizer solution 3 mL (has no administration in time range)  sodium zirconium cyclosilicate (LOKELMA) packet 10 g (has no administration in time range)  calcium gluconate 1 g/ 50 mL sodium chloride IVPB (has no administration in time range)  lactated ringers bolus 1,000 mL (0 mLs Intravenous Stopped 03/28/22 1801)    And  lactated ringers bolus 1,000 mL (0 mLs Intravenous Stopped 03/28/22 1801)  acetaminophen (TYLENOL) suppository 650 mg (650 mg Rectal Given 03/28/22 1659)  insulin aspart (novoLOG) injection 10 Units (10 Units Intravenous Given 03/28/22 1659)    And  dextrose 50 % solution 50 mL (50 mLs Intravenous Given 03/28/22 1659)  sodium bicarbonate injection 50 mEq (50 mEq Intravenous Given 03/28/22 1659)  aspirin suppository 300 mg (300 mg Rectal Given 03/28/22 1848)  iohexol (OMNIPAQUE) 300 MG/ML solution 100 mL (100 mLs Intravenous Contrast Given 03/28/22 1927)    ED Course/ Medical Decision Making/ A&P                             Medical Decision Making Amount and/or Complexity of Data Reviewed Labs: ordered. Radiology: ordered. ECG/medicine tests: ordered.  Risk OTC drugs. Prescription drug management. Decision regarding hospitalization.   This patient presents to the ED for concern of AMS, this involves an extensive number of treatment options, and is a complaint that carries with it a high risk of complications and morbidity.  The differential diagnosis includes sepsis, hypercarbic resp failure, cva, electrolyte abn   Co morbidities that complicate the patient evaluation  COPD, GERD, CAD, Asthma, CVA, PVC, lung cancer, and aortic  stenosis s/p TAVR   Additional history obtained:  Additional history obtained from epic chart review External records from outside source obtained and reviewed including EMS, family   Lab Tests:  I Ordered, and personally interpreted labs.  The pertinent results include:  cbc with wbc elevated at 20.7; 1st lactic nl, but 2nd is up to 2.0, inr 1.2; abg with ph 7.18, oco2 54, po2 60; covid/flu/rsv neg; ua + ketones; troponin elevated at 1184 (2nd trop going down to 1126)   Imaging Studies ordered:  I ordered imaging studies including cxr/ct chest/abd/pelvis I independently visualized and interpreted imaging which showed CXR:  1. Multifocal patchy airspace opacities bilaterally, suspicious for multifocal pneumonia. 2. Small right pleural effusion. CT chest/abd/pelvis:  Multifocal pneumonia. Recommend follow-up CT chest in 3 months to  evaluate for complete resolution and exclude underlying lesions.  2. Bilateral trace pleural effusions.  3. Enteric tube with tip within the gastric lumen and side port  within the distal esophagus. Recommend advancement by 5 cm.  4. Small hiatal hernia.  5. Colonic diverticulosis with no acute diverticulitis.  6. Uterine fibroids.  7. Aortic Atherosclerosis (ICD10-I70.0) - severe.   I agree with the radiologist interpretation   Cardiac Monitoring:  The patient was maintained on a cardiac monitor.  I personally viewed and interpreted the cardiac monitored which showed an underlying rhythm of: st   Medicines ordered and prescription drug management:  I ordered medication including sepsis fluids and iv rocephin/zithromax  for presumed sepsis likely from pna  Reevaluation of the patient after these medicines showed that the patient improved I have reviewed the patients home medicines and have made adjustments as needed  Critical Interventions:  Intubation/abx/fluids   Consultations Obtained:  I requested consultation with the hospitalist  (Dr. Clearence Ped),  and discussed lab and imaging findings as well as pertinent plan -  she recommends speaking with [facility] at [facility].  I spoke with Dr. Halford Chessman (CCM) who did accept pt for transfer.     Problem List / ED Course:  Sepsis:  from pna.  Pt given sepsis fluids and IV abx.  Bp and HR improving.  [facility] has asked to start levophed for the transfer, so that was ordered.  Resp failure:  hypercarbic and hypoxic.  Pt on ventilator. Hyperkalemia:  pt given insulin/glucose/bicarb/ivfs; repeat k down to 4.5 Elevated trop:  likely due to strain.  No stemi on EKG.  Trop going down.. Elevated LFTs:  CT scan ordered, but no liver abn    Reevaluation:  After the interventions noted above, I reevaluated the patient and found that they have :improved   Social Determinants of Health:  Lives at home   Dispostion:  After consideration of the diagnostic results and the patients response to treatment, I feel that the patent would benefit from admission.    CRITICAL CARE Performed by: Isla Pence   Total critical care time: 60 minutes  Critical care time was exclusive of separately billable procedures and treating other patients.  Critical care was necessary to treat or prevent imminent or life-threatening deterioration.  Critical care was time spent personally by me on the following activities: development of treatment plan with patient and/or surrogate as well as nursing, discussions with consultants, evaluation of patient's response to treatment, examination of patient, obtaining history from patient or surrogate, ordering and performing treatments and interventions, ordering and review of laboratory studies, ordering and review of radiographic studies, pulse oximetry and re-evaluation of patient's condition.         Final Clinical Impression(s) / ED Diagnoses Final diagnoses:  Sepsis with acute hypoxic respiratory failure and septic shock, due to unspecified organism [facility])   Multifocal pneumonia  Elevated troponin  Hyperkalemia  Elevated LFTs    Rx / DC Orders ED Discharge Orders     None         Isla Pence, MD 03/28/22 2029

## 2022-03-28 NOTE — Progress Notes (Signed)
[facility] Physician-Brief Progress Note Patient Name: ANISHA STARLIPER DOB: 1955-08-17 MRN: [id number]   Date of Service  03/28/2022  HPI/Events of Note  68 F history of lung CA, COPD, CAD, AS s/p TAVR, CVA, brought by EMS to [place] after husband found patient unresponsive on the floor incontinent of urine. Noted to be hypoxic. Reportedly has had a cough for the past few days that was supposedly getting better. Intubated for airway protection and for acute hypercapneic and hypoxemic respiratory failure. CXR with multifocal opacities.  Seen intubated PRVC 24/440/50%/5 PEEP, MV 11, peak pressure 26 BP 124/82  HR 78  O2 100%, Temp 38.1  eICU Interventions  Acute hypercapnic and hypoxemic respiratory failure secondary to COPD pneumonia. Now intubated. Given ceftriaxone and azithromycin, also on systemic steroids and bronchodilators. Bedside CCM team informed of admission.     Intervention Category Evaluation Type: New Patient Evaluation  Judd Lien 03/28/2022, 9:27 PM

## 2022-03-28 NOTE — ED Notes (Signed)
Report given to [facility], ETA 20 mins.

## 2022-03-28 NOTE — ED Triage Notes (Signed)
Pt arrived via [facility] from home. Husband called ems due to pt was on floor and unresponsive. EMS noted urination around the pt as if she had been there for awhile. Husband said pt has had a cough for a few days but was getting better. Upon arrival oxygen on RA was 60-70's and a nonrebreather applied at 15 lpm and oxygen went up to 92%. Pt has a hx of COPD .

## 2022-03-28 NOTE — ED Notes (Signed)
Carried to Cat scan and back. Checked vent , decreased oxygen from 100 to 50 percent. Suctioned copious secretions from ET tube thick yellow , white secretions. Suctioned copious white Clear secretions from Mouth.

## 2022-03-28 NOTE — ED Notes (Signed)
Intubation 1545hrs: 23 at the lip with 7.5 size. OG is 45 at the lip. Cbg - 87  Top dentures in room 20 mg Etomidate - 1543 100mg  of suc-1544

## 2022-03-28 NOTE — Progress Notes (Signed)
Received patient from [facility] transported from [facility]. Placed on Servo I on same settings as [facility]. (PRVC 440/24/50/+5) Will draw ABG in 1 hour. Will cont to monitor.   Otho Darner

## 2022-03-28 NOTE — ED Notes (Addendum)
Date and time results received: 03/28/22 4:32 PM  Received and reported by Newman Pies, RN  Test: pH Critical Value: 7.18  Name of Provider Notified: Aileen Pilot

## 2022-03-28 NOTE — ED Notes (Signed)
Attempted to call report, unit unsure where they are going to assign pt and will return call when assignment is final.

## 2022-03-28 NOTE — Progress Notes (Signed)
Attempted vasopressor iv x2.   Would not thread.  MD in., said to stop further attempts.  Levophed will be weaned .

## 2022-03-28 NOTE — Sepsis Progress Note (Signed)
[facility] following code sepsis °

## 2022-03-28 NOTE — ED Notes (Signed)
ABG collected before [facility] left [facility] [place] in [place] - transmitted at 40: 59

## 2022-03-28 NOTE — H&P (Cosign Needed)
NAME:  Emily Rocha, MRN:  [id number], DOB:  Jul 11, 1955, LOS: 0 ADMISSION DATE:  03/28/2022, CONSULTATION DATE:  2/11 REFERRING MD:  Dr. Gilford Raid, CHIEF COMPLAINT:  multifocal pneumonia   History of Present Illness:  Patient is a 67 yo F w/ pertinent PMH of COPD, Asthma, CAD, CVA, lung cancer, aortic stenosis s/p TAVR admitted to [facility] ED 2/11 with respiratory failure.   Patient has been sick since 2/7 and having to use supplemental O2 at home that she usually doesn't have to use. On 2/10 patient minimally responsive and confused. 2/11 worsening mental status. EMS called and transferred patient to [facility] ED 2/11. Vitals stable. Patient obtunded/minimally responsive. O2 sat low on NRB. Patient intubated. CXR showing multifocal opacities concerning for multifocal pneumonia. Tmax 102 F/wbc 20.7.  Started on Rocephin/azithromycin.  Cultures obtained. IV fluids given. COVID, flu, RSV negative. UA rare bacteria.  LA 0.8 then 2.0.  ABG 7.18, 54, 60, 19.5.  Post intubation hypotension requiring low dose levo. Patient being transported to [facility].  PCCM consulted.  Pertinent ED labs: K6.1, glucose 87, creatinine 0.83, mild elevated LFTs, troponin 1184 then 1126.  Pertinent  Medical History   Past Medical History:  Diagnosis Date   Anxiety    Asthma    COPD (chronic obstructive pulmonary disease) ([place])    Coronary artery disease    a. s/p NSTEMI in 11/2016 and required CABG with LIMA-LAD, SVG-LCx and SVG-D1 and complicated by cardiogenic shock b. cath in 05/2020 showing severe RCA stenosis and treated with orbital atherectomy and stent placement   GERD (gastroesophageal reflux disease)    History of CVA (cerebrovascular accident)    History of lung cancer 2020   Peripheral vascular disease ([place])    S/P TAVR (transcatheter aortic valve replacement) 08/12/2020   s/p TAVR with a 23 mm Edwards S3U via the TF approach by Dr. Angelena Form & Dr. Cyndia Bent   Severe aortic stenosis      Significant Hospital  Events: Including procedures, antibiotic start and stop dates in addition to other pertinent events   2/11 admitted to [facility] ED, multifocal pneumonia, intubated, AKI, hyperkalemia; Transferring to [facility]. PCCM consulted  Interim History / Subjective:  See above  Objective   Blood pressure (!) 113/51, pulse 88, temperature (!) 100.8 F (38.2 C), resp. rate (!) 21, height 5\' 4"  (1.626 m), weight 63.5 kg, SpO2 100 %.    Vent Mode: PRVC FiO2 (%):  [100 %] 100 % Set Rate:  [24 bmp] 24 bmp Vt Set:  [440 mL] 440 mL PEEP:  [5 cmH20] 5 cmH20 Plateau Pressure:  [19 cmH20] 19 cmH20   Intake/Output Summary (Last 24 hours) at 03/28/2022 1912 Last data filed at 03/28/2022 1801 Gross per 24 hour  Intake 2000 ml  Output --  Net 2000 ml   Filed Weights   03/28/22 1611 03/28/22 1615  Weight: 63.5 kg 63.5 kg    Examination: General: critically ill appearing on mech vent HEENT: MM pink/moist; ETT in place Neuro: sedate on 40 mcg propofol; mae spontaneously; not following commands; PERRL 72mm b/l CV: s1s2, RRR, no m/r/g PULM:  wheezing/rhonchi BS bilaterally; on mech vent PRVC GI: soft, bsx4 active  Extremities: warm/dry, no edema  Skin: no rashes or lesions appreciated   Resolved Hospital Problem list     Assessment & Plan:  Acute respiratory failure w/ hypercarbia Multifocal pneumonia Possible COPD exacerbation Hx of COPD/Asthma: on trelegy Plan: -Check CXR on arrival to [facility] -LTVV strategy with tidal volumes of 6-8 cc/kg ideal  body weight -increase RR; check ABG and adjust settings accordingly  -Wean PEEP/FiO2 for SpO2 > 92% -VAP bundle in place -Daily SAT and SBT -PAD protocol in place -wean sedation for RASS goal 0 to -1 -Rocephin/azithromycin for CAP PPx -Tracheal aspirate -Check RVP, urine Legionella strep/strep -will give IV steroids and iv mag  -scheduled duoneb w/ pulmicort -check bnp  Acute encephalopathy -likely hypercarbia related; could be sepsis from  pneumonia Plan: -check CT head -increase RR for normocapnia -check ammonia, UDS, ethanol -treat sepsis as below; follow cultures -limit sedating meds  Sepsis: 2/2 multifocal pneumonia Plan: -Rocephin/azithromycin for CAP PPx -Follow pan culture -Check PCT -Trend WBC/fever curve -IV fluids -Trend LA and troponin  Hypotension: likely sedation related Plan: -wean sedation for RASS 0 to -1 -wean levo for map goal >65 -IV fluids -treat sepsis as above  NSTEMI likely type 2 -Likely demand ischemia from sepsis -EKG no ST elevation Plan: -Telemetry monitoring -trend troponin  Hyperkalemia Mild hyponatremia Plan: -given insulin/dextrose; repeat K 4.5 -Trend BMP  Mild transaminitis Plan: -Trend CMP -Consider checking RUQ ultrasound and hepatitis panel  CAD HTN Aortic stenosis s/p TAVR Hx of CVA Plan: -consider resuming asa pending ct head results -hold statin given elevated lfts -hold metoprolol and torsemide  Hx of lung cancer -2020 status post right upper lobectomy for T1 cN0 M0 stage I squamous cell cancer  Plan: -f/u outpt -smoking cessation counseling  Anxiety: on xanax 0.5 TID Plan: -hold xanax given AMS -will likely need low dose benzo to avoid benzo withdrawal  GERD Small hiatal hernia Plan: -PPI  Best Practice (right click and "Reselect all SmartList Selections" daily)   Diet/type: NPO w/ meds via tube DVT prophylaxis: SCD; if ct head negative for bleed consider subcu heparin or lovenox GI prophylaxis: PPI Lines: N/A Foley:  N/A Code Status:  full code Last date of multidisciplinary goals of care discussion [Unable to reach spouse over phone. Spoke with son over phone and updated. Would like to keep patient full code.]  Labs   CBC: Recent Labs  Lab 03/28/22 1549 03/28/22 1551  WBC 20.7*  --   NEUTROABS 17.6*  --   HGB 13.2 15.3*  HCT 44.8 45.0  MCV 90.5  --   PLT 199  --     Basic Metabolic Panel: Recent Labs  Lab  03/28/22 1549 03/28/22 1551  NA 135 134*  K 6.1* 6.3*  CL 94* 96*  CO2 29  --   GLUCOSE 87 87  BUN 38* 49*  CREATININE 0.83 0.80  CALCIUM 8.8*  --    GFR: Estimated Creatinine Clearance: 58.1 mL/min (by C-G formula based on SCr of 0.8 mg/dL). Recent Labs  Lab 03/28/22 1549 03/28/22 1753  WBC 20.7*  --   LATICACIDVEN 0.8 2.0*    Liver Function Tests: Recent Labs  Lab 03/28/22 1549  AST 163*  ALT 166*  ALKPHOS 147*  BILITOT 1.9*  PROT 7.3  ALBUMIN 3.3*   No results for input(s): "LIPASE", "AMYLASE" in the last 168 hours. No results for input(s): "AMMONIA" in the last 168 hours.  ABG    Component Value Date/Time   PHART 7.18 (LL) 03/28/2022 1616   PCO2ART 54 (H) 03/28/2022 1616   PO2ART 60 (L) 03/28/2022 1616   HCO3 19.5 (L) 03/28/2022 1616   TCO2 34 (H) 03/28/2022 1551   ACIDBASEDEF 8.7 (H) 03/28/2022 1616   O2SAT 86.6 03/28/2022 1616     Coagulation Profile: Recent Labs  Lab 03/28/22 1549  INR 1.2  Cardiac Enzymes: No results for input(s): "CKTOTAL", "CKMB", "CKMBINDEX", "TROPONINI" in the last 168 hours.  HbA1C: Hgb A1c MFr Bld  Date/Time Value Ref Range Status  12/22/2017 03:35 PM 6.2 (H) 4.8 - 5.6 % Final    Comment:    (NOTE) Pre diabetes:          5.7%-6.4% Diabetes:              >6.4% Glycemic control for   <7.0% adults with diabetes   12/20/2016 03:04 PM 6.0 (H) 4.8 - 5.6 % Final    Comment:    (NOTE) Pre diabetes:          5.7%-6.4% Diabetes:              >6.4% Glycemic control for   <7.0% adults with diabetes     CBG: Recent Labs  Lab 03/28/22 1546  GLUCAP 87    Review of Systems:   Patient is encephalopathic and/or intubated. Therefore history has been obtained from chart review.    Past Medical History:  She,  has a past medical history of Anxiety, Asthma, COPD (chronic obstructive pulmonary disease) ([place]), Coronary artery disease, GERD (gastroesophageal reflux disease), History of CVA (cerebrovascular accident),  History of lung cancer (2020), Peripheral vascular disease ([place]), S/P TAVR (transcatheter aortic valve replacement) (08/12/2020), and Severe aortic stenosis.   Surgical History:   Past Surgical History:  Procedure Laterality Date   AORTIC ARCH ANGIOGRAPHY N/A 11/04/2017   Procedure: AORTIC ARCH ANGIOGRAPHY;  Surgeon: Elam Dutch, MD;  Location: [place] [facility];  Service: Cardiovascular;  Laterality: N/A;   APPLICATION OF WOUND VAC N/A 01/06/2018   Procedure: APPLICATION OF WOUND VAC;  Surgeon: Ivin Poot, MD;  Location: [place];  Service: Thoracic;  Laterality: N/A;   APPLICATION OF WOUND VAC N/A 01/13/2018   Procedure: WOUND VAC CHANGE;  Surgeon: Ivin Poot, MD;  Location: [place];  Service: Thoracic;  Laterality: N/A;   CARDIAC CATHETERIZATION     CAROTID-SUBCLAVIAN BYPASS GRAFT Right 12/26/2017   Procedure: AORTIC TO RIGHT COMMON CAROTID AND RIGHT SUBCLAVIAN  ARTERY  BYPASS;  Surgeon: Elam Dutch, MD;  Location: [place];  Service: Vascular;  Laterality: Right;   CHOLECYSTECTOMY     CLOSURE OF DIAPHRAGM  12/26/2017   Procedure: REPAIR OF DIAPHRAGM;  Surgeon: Ivin Poot, MD;  Location: [place];  Service: Thoracic;;   COLONOSCOPY N/A 10/03/2014   Procedure: COLONOSCOPY;  Surgeon: Rogene Houston, MD;  [facility];  Service: Endoscopy;  Laterality: N/A;  730   CORONARY ARTERY BYPASS GRAFT N/A 12/14/2016   Procedure: CORONARY ARTERY BYPASS GRAFTING (CABG) x three , using left internal mammary artery and right leg greater saphenous vein harvested endoscopically;  Surgeon: Ivin Poot, MD;  Location: [place];  Service: Open Heart Surgery;  Laterality: N/A;   CORONARY ATHERECTOMY N/A 06/11/2020   Procedure: CORONARY ATHERECTOMY;  Surgeon: Burnell Blanks, MD;  Location: [place] [facility];  Service: Cardiovascular;  Laterality: N/A;   CORONARY STENT INTERVENTION N/A 06/11/2020   Procedure: CORONARY STENT INTERVENTION;  Surgeon: Burnell Blanks,  MD;  Location: [place] [facility];  Service: Cardiovascular;  Laterality: N/A;   ENDARTERECTOMY Right 12/06/2014   Procedure: ENDARTERECTOMY CAROTid;  Surgeon: Elam Dutch, MD;  Location: [facility];  Service: Vascular;  Laterality: Right;   ESOPHAGOGASTRODUODENOSCOPY N/A 12/03/2016   Procedure: ESOPHAGOGASTRODUODENOSCOPY (EGD);  Surgeon: Rogene Houston, MD;  [facility];  Service: Endoscopy;  Laterality:  N/A;  7:30   INTRAVASCULAR IMAGING/OCT N/A 06/11/2020   Procedure: INTRAVASCULAR IMAGING/OCT;  Surgeon: Burnell Blanks, MD;  Location: [place] [facility];  Service: Cardiovascular;  Laterality: N/A;   INTRAVASCULAR PRESSURE WIRE/FFR STUDY N/A 06/04/2020   Procedure: INTRAVASCULAR PRESSURE WIRE/FFR STUDY;  Surgeon: Burnell Blanks, MD;  Location: [place] [facility];  Service: Cardiovascular;  Laterality: N/A;   LEFT HEART CATH AND CORONARY ANGIOGRAPHY N/A 12/14/2016   Procedure: LEFT HEART CATH AND CORONARY ANGIOGRAPHY;  Surgeon: Burnell Blanks, MD;  Location: [place] [facility];  Service: Cardiovascular;  Laterality: N/A;   LOBECTOMY Right 10/26/2018   Procedure: RIGHT UPPER LOBECTOMY;  Surgeon: Lajuana Matte, MD;  Location: [place];  Service: Thoracic;  Laterality: Right;   PERIPHERAL VASCULAR CATHETERIZATION N/A 12/05/2014   Procedure:  Carotid Arch Angiography;  Surgeon: Conrad Sula, MD;  Location: [place] [facility];  Service: Cardiovascular;  Laterality: N/A;   RIGHT/LEFT HEART CATH AND CORONARY/GRAFT ANGIOGRAPHY N/A 06/04/2020   Procedure: RIGHT/LEFT HEART CATH AND CORONARY/GRAFT ANGIOGRAPHY;  Surgeon: Burnell Blanks, MD;  Location: [place] [facility];  Service: Cardiovascular;  Laterality: N/A;   STERNAL WOUND DEBRIDEMENT N/A 01/06/2018   Procedure: STERNAL WOUND DEBRIDEMENT;  Surgeon: Ivin Poot, MD;  Location: [place];  Service: Thoracic;  Laterality: N/A;   STERNOTOMY N/A 12/26/2017   Procedure: REDO STERNOTOMY;  Surgeon: Prescott Gum, Collier Salina, MD;  Location: [place];  Service: Thoracic;  Laterality: N/A;   TEE WITHOUT CARDIOVERSION N/A 12/14/2016   Procedure: TRANSESOPHAGEAL ECHOCARDIOGRAM (TEE);  Surgeon: Prescott Gum, Collier Salina, MD;  Location: [place];  Service: Open Heart Surgery;  Laterality: N/A;   TEE WITHOUT CARDIOVERSION N/A 08/12/2020   Procedure: TRANSESOPHAGEAL ECHOCARDIOGRAM (TEE);  Surgeon: Burnell Blanks, MD;  Location: [place] [facility];  Service: Open Heart Surgery;  Laterality: N/A;   TRANSCATHETER AORTIC VALVE REPLACEMENT, TRANSFEMORAL N/A 08/12/2020   Procedure: TRANSCATHETER AORTIC VALVE REPLACEMENT, TRANSFEMORAL;  Surgeon: Burnell Blanks, MD;  Location: [place] [facility];  Service: Open Heart Surgery;  Laterality: N/A;   VIDEO ASSISTED THORACOSCOPY (VATS)/WEDGE RESECTION Right 10/26/2018   Procedure: VIDEO ASSISTED THORACOSCOPY (VATS)/WEDGE RESECTION;  Surgeon: Lajuana Matte, MD;  Location: [place];  Service: Thoracic;  Laterality: Right;   VIDEO BRONCHOSCOPY N/A 10/26/2018   Procedure: VIDEO BRONCHOSCOPY;  Surgeon: Lajuana Matte, MD;  Location: [place];  Service: Thoracic;  Laterality: N/A;     Social History:   reports that she has been smoking cigarettes. She has never used smokeless tobacco. She reports that she does not drink alcohol and does not use drugs.   Family History:  Her family history includes Cancer in her mother; Diabetes in her mother; Heart attack in her father and mother; Heart disease in her father and mother.   Allergies No Known Allergies   Home Medications  Prior to Admission medications   Medication Sig Start Date End Date Taking? Authorizing Provider  albuterol (PROVENTIL) (2.5 MG/3ML) 0.083% nebulizer solution Take 2.5 mg by nebulization every 4 (four) hours as needed for wheezing or shortness of breath. 03/30/19   [provider]  albuterol (VENTOLIN HFA) 108 (90 Base) MCG/ACT inhaler Inhale 1-2 puffs into the lungs every 6 (six) hours as needed  for wheezing or shortness of breath.    [provider]  ALPRAZolam Duanne Moron) 0.5 MG tablet Take 0.5 mg by mouth 3 (three) times daily.    [provider]  Ascorbic Acid (VITAMIN C GUMMIE PO) Chew  500 mg by mouth daily.    [provider]  aspirin EC 81 MG tablet Take 1 tablet (81 mg total) by mouth daily. Swallow whole. 12/21/19   Strader, Fransisco Hertz, PA-C  atorvastatin (LIPITOR) 40 MG tablet Take 1 tablet (40 mg total) daily by mouth. 12/27/16   Gold, Wilder Glade, PA-C  cholecalciferol (VITAMIN D3) 25 MCG (1000 UNIT) tablet Take 1,000 Units by mouth in the morning.    [provider]  diphenhydramine-acetaminophen (TYLENOL PM) 25-500 MG TABS tablet Take 2 tablets by mouth at bedtime.    [provider]  magnesium oxide (MAG-OX) 400 (240 Mg) MG tablet Take 400 mg by mouth daily.    [provider]  metoprolol succinate (TOPROL-XL) 50 MG 24 hr tablet TAKE ONE TABLET (50MG  TOTAL) BY MOUTH DAILY WITH OR IMMEDIATELY FOLLOWING A MEAL 02/22/22   Arnoldo Lenis, MD  Multiple Vitamin (MULTIVITAMIN WITH MINERALS) TABS tablet Take 1 tablet by mouth daily. 12/01/18   Nani Skillern, PA-C  pantoprazole (PROTONIX) 40 MG tablet Take 40 mg by mouth in the morning.    [provider]  potassium chloride SA (KLOR-CON) 20 MEQ tablet Take 20 mEq by mouth in the morning.    [provider]  Tiotropium Bromide-Olodaterol (STIOLTO RESPIMAT) 2.5-2.5 MCG/ACT AERS Inhale 2 puffs into the lungs daily. Patient not taking: Reported on 01/25/2022 02/07/20   Parrett, Fonnie Mu, NP  torsemide (DEMADEX) 20 MG tablet Take 40 mg by mouth See admin instructions. Take 2 tablets (40 mg) by mouth scheduled on Mondays, Tuesdays, Wednesdays, Thursdays, Fridays & Saturdays. (Hold dose on Sundays)    [provider]  Donnal Debar 100-62.5-25 MCG/ACT AEPB SMARTSIG:1 Inhalation Via Inhaler Daily 01/04/22   [provider]     Critical care time: 45  minutes    JD Rexene Agent [facility] 03/28/2022, 7:12 PM  Please see [email or web address] for pager details.  From 7A-7P if no response, please call [phone]. After hours, please call ELink [phone].

## 2022-03-28 NOTE — ED Notes (Addendum)
This RN walked in room and patient was pulling at tube and eyes were open. Prop increased by 24mcg and PRN fentanyl given. Pt now resting.

## 2022-03-29 ENCOUNTER — Inpatient Hospital Stay (HOSPITAL_COMMUNITY): Payer: Medicare HMO

## 2022-03-29 DIAGNOSIS — J189 Pneumonia, unspecified organism: Secondary | ICD-10-CM | POA: Diagnosis not present

## 2022-03-29 LAB — COMPREHENSIVE METABOLIC PANEL
ALT: 105 U/L — ABNORMAL HIGH (ref 0–44)
AST: 78 U/L — ABNORMAL HIGH (ref 15–41)
Albumin: 2.3 g/dL — ABNORMAL LOW (ref 3.5–5.0)
Alkaline Phosphatase: 117 U/L (ref 38–126)
Anion gap: 14 (ref 5–15)
BUN: 24 mg/dL — ABNORMAL HIGH (ref 8–23)
CO2: 26 mmol/L (ref 22–32)
Calcium: 8.6 mg/dL — ABNORMAL LOW (ref 8.9–10.3)
Chloride: 96 mmol/L — ABNORMAL LOW (ref 98–111)
Creatinine, Ser: 0.78 mg/dL (ref 0.44–1.00)
GFR, Estimated: >60 mL/min (ref >60)
Glucose, Bld: 166 mg/dL — ABNORMAL HIGH (ref 70–99)
Potassium: 5 mmol/L (ref 3.5–5.1)
Sodium: 136 mmol/L (ref 135–145)
Total Bilirubin: 1.1 mg/dL (ref 0.3–1.2)
Total Protein: 5.6 g/dL — ABNORMAL LOW (ref 6.5–8.1)

## 2022-03-29 LAB — PROCALCITONIN: Procalcitonin: 1.36 ng/mL

## 2022-03-29 LAB — CBC
HCT: 37.6 % (ref 36.0–46.0)
Hemoglobin: 11.2 g/dL — ABNORMAL LOW (ref 12.0–15.0)
MCH: 26.2 pg (ref 26.0–34.0)
MCHC: 29.8 g/dL — ABNORMAL LOW (ref 30.0–36.0)
MCV: 87.9 fL (ref 80.0–100.0)
Platelets: 152 10*3/uL (ref 150–400)
RBC: 4.28 MIL/uL (ref 3.87–5.11)
RDW: 16.4 % — ABNORMAL HIGH (ref 11.5–15.5)
WBC: 10.6 10*3/uL — ABNORMAL HIGH (ref 4.0–10.5)
nRBC: 0.2 % (ref 0.0–0.2)

## 2022-03-29 LAB — BLOOD CULTURE ID PANEL (REFLEXED) - BCID2

## 2022-03-29 LAB — MAGNESIUM: Magnesium: 3.3 mg/dL — ABNORMAL HIGH (ref 1.7–2.4)

## 2022-03-29 LAB — GLUCOSE, CAPILLARY
Glucose-Capillary: 104 mg/dL — ABNORMAL HIGH (ref 70–99)
Glucose-Capillary: 145 mg/dL — ABNORMAL HIGH (ref 70–99)
Glucose-Capillary: 177 mg/dL — ABNORMAL HIGH (ref 70–99)
Glucose-Capillary: 227 mg/dL — ABNORMAL HIGH (ref 70–99)
Glucose-Capillary: 143 mg/dL — ABNORMAL HIGH (ref 70–99)
Glucose-Capillary: 148 mg/dL — ABNORMAL HIGH (ref 70–99)
Glucose-Capillary: 153 mg/dL — ABNORMAL HIGH (ref 70–99)

## 2022-03-29 LAB — RESPIRATORY PANEL BY PCR

## 2022-03-29 LAB — RAPID URINE DRUG SCREEN, HOSP PERFORMED
Amphetamines: NOT DETECTED
Barbiturates: NOT DETECTED
Benzodiazepines: POSITIVE — AB
Cocaine: NOT DETECTED
Opiates: NOT DETECTED
Tetrahydrocannabinol: NOT DETECTED

## 2022-03-29 LAB — TROPONIN I (HIGH SENSITIVITY)
Troponin I (High Sensitivity): 1030 ng/L (ref <18)
Troponin I (High Sensitivity): 767 ng/L (ref <18)

## 2022-03-29 LAB — TRIGLYCERIDES: Triglycerides: 127 mg/dL (ref <150)

## 2022-03-29 LAB — STREP PNEUMONIAE URINARY ANTIGEN
Strep Pneumo Urinary Antigen: NEGATIVE
Strep Pneumo Urinary Antigen: NEGATIVE

## 2022-03-29 LAB — LACTIC ACID, PLASMA: Lactic Acid, Venous: 1 mmol/L (ref 0.5–1.9)

## 2022-03-29 LAB — HIV ANTIBODY (ROUTINE TESTING W REFLEX): HIV Screen 4th Generation wRfx: NONREACTIVE

## 2022-03-29 MED ORDER — VANCOMYCIN HCL 500 MG/100ML IV SOLN
500.0000 mg | Freq: Two times a day (BID) | INTRAVENOUS | Status: DC
  Administered 2022-03-30: 500 mg via INTRAVENOUS
  Filled 2022-03-29 (×2): qty 100

## 2022-03-29 MED ORDER — ORAL CARE MOUTH RINSE
15.0000 mL | OROMUCOSAL | Status: DC
  Administered 2022-03-29 – 2022-03-30 (×13): 15 mL via OROMUCOSAL

## 2022-03-29 MED ORDER — CHLORHEXIDINE GLUCONATE CLOTH 2 % EX PADS
6.0000 | MEDICATED_PAD | Freq: Every day | CUTANEOUS | Status: DC
  Administered 2022-03-29 – 2022-03-30 (×3): 6 via TOPICAL

## 2022-03-29 MED ORDER — VANCOMYCIN HCL 1500 MG/300ML IV SOLN
1500.0000 mg | Freq: Once | INTRAVENOUS | Status: AC
  Administered 2022-03-29: 1500 mg via INTRAVENOUS
  Filled 2022-03-29: qty 300

## 2022-03-29 MED ORDER — ORAL CARE MOUTH RINSE: 15.0000 mL | OROMUCOSAL | Status: DC | PRN

## 2022-03-29 MED ORDER — REVEFENACIN 175 MCG/3ML IN SOLN
175.0000 ug | Freq: Every day | RESPIRATORY_TRACT | Status: DC
  Administered 2022-03-29 – 2022-04-06 (×9): 175 ug via RESPIRATORY_TRACT
  Filled 2022-03-29 (×9): qty 3

## 2022-03-29 MED ORDER — ALPRAZOLAM 0.25 MG PO TABS
0.5000 mg | ORAL_TABLET | Freq: Three times a day (TID) | ORAL | Status: DC
  Administered 2022-03-29: 0.5 mg
  Filled 2022-03-29: qty 2

## 2022-03-29 MED ORDER — ASPIRIN 81 MG PO CHEW
81.0000 mg | CHEWABLE_TABLET | Freq: Every day | ORAL | Status: DC
  Administered 2022-03-29: 81 mg
  Filled 2022-03-29: qty 1

## 2022-03-29 MED ORDER — INSULIN ASPART 100 UNIT/ML IJ SOLN
0.0000 [IU] | INTRAMUSCULAR | Status: DC
  Administered 2022-03-29: 3 [IU] via SUBCUTANEOUS
  Administered 2022-03-29 (×2): 2 [IU] via SUBCUTANEOUS
  Administered 2022-03-29: 3 [IU] via SUBCUTANEOUS
  Administered 2022-03-29: 5 [IU] via SUBCUTANEOUS
  Administered 2022-03-30 (×2): 2 [IU] via SUBCUTANEOUS

## 2022-03-29 MED ORDER — ENOXAPARIN SODIUM 40 MG/0.4ML IJ SOSY
40.0000 mg | PREFILLED_SYRINGE | INTRAMUSCULAR | Status: DC
  Administered 2022-03-29 – 2022-03-30 (×2): 40 mg via SUBCUTANEOUS
  Filled 2022-03-29 (×2): qty 0.4

## 2022-03-29 MED ORDER — LACTULOSE 10 GM/15ML PO SOLN
30.0000 g | Freq: Once | ORAL | Status: AC
  Administered 2022-03-29: 30 g
  Filled 2022-03-29: qty 45

## 2022-03-29 MED ORDER — ALPRAZOLAM 0.25 MG PO TABS: 0.5000 mg | ORAL_TABLET | Freq: Two times a day (BID) | ORAL | Status: DC

## 2022-03-29 MED ORDER — DEXMEDETOMIDINE HCL IN NACL 400 MCG/100ML IV SOLN
INTRAVENOUS | Status: AC
  Filled 2022-03-29: qty 100

## 2022-03-29 MED ORDER — ALBUTEROL SULFATE (2.5 MG/3ML) 0.083% IN NEBU
2.5000 mg | INHALATION_SOLUTION | Freq: Four times a day (QID) | RESPIRATORY_TRACT | Status: DC | PRN
  Administered 2022-03-29 – 2022-03-30 (×3): 2.5 mg via RESPIRATORY_TRACT
  Filled 2022-03-29 (×3): qty 3

## 2022-03-29 MED ORDER — DEXMEDETOMIDINE HCL IN NACL 400 MCG/100ML IV SOLN
0.0000 ug/kg/h | INTRAVENOUS | Status: DC
  Administered 2022-03-29: 0.4 ug/kg/h via INTRAVENOUS

## 2022-03-29 NOTE — Procedures (Signed)
Extubation Procedure Note  Patient Details:   Name: Emily Rocha DOB: 1955/10/14 MRN: [id number]   Airway Documentation:    Vent end date: 03/29/22 Vent end time: 8871   Evaluation  O2 sats: stable throughout Complications: No apparent complications Patient did tolerate procedure well. Bilateral Breath Sounds: Diminished   No  Patient extubated per order to 8L [place] with no apparent complications. Positive cuff leak was noted prior to extubation. Patient is alert, has strong cough, but has not spoken yet. Vitals are stable. RT will continue to monitor.   [name] [name] 03/29/2022, 12:47 PM

## 2022-03-29 NOTE — Progress Notes (Signed)
Pt transported to CT and back to 0B86 without complications.  Emily Rocha

## 2022-03-29 NOTE — Progress Notes (Signed)
NAME:  Emily Rocha, MRN:  [id number], DOB:  05-29-1955, LOS: 1 ADMISSION DATE:  03/28/2022, CONSULTATION DATE:  2/11 REFERRING MD:  Dr. Gilford Raid, CHIEF COMPLAINT:  multifocal pneumonia   History of Present Illness:  Patient is a 67 yo F w/ pertinent PMH of COPD, Asthma, CAD, CVA, lung cancer, aortic stenosis s/p TAVR admitted to [facility] ED 2/11 with respiratory failure.   Patient has been sick since 2/7 and having to use supplemental O2 at home that she usually doesn't have to use. On 2/10 patient minimally responsive and confused. 2/11 worsening mental status. EMS called and transferred patient to [facility] ED 2/11. Vitals stable. Patient obtunded/minimally responsive. O2 sat low on NRB. Patient intubated. CXR showing multifocal opacities concerning for multifocal pneumonia. Tmax 102 F/wbc 20.7.  Started on Rocephin/azithromycin.  Cultures obtained. IV fluids given. COVID, flu, RSV negative. UA rare bacteria.  LA 0.8 then 2.0.  ABG 7.18, 54, 60, 19.5.  Post intubation hypotension requiring low dose levo. Patient being transported to [facility].  PCCM consulted.  Pertinent ED labs: K6.1, glucose 87, creatinine 0.83, mild elevated LFTs, troponin 1184 then 1126.  Pertinent  Medical History   Past Medical History:  Diagnosis Date   Anxiety    Asthma    COPD (chronic obstructive pulmonary disease) ([place])    Coronary artery disease    a. s/p NSTEMI in 11/2016 and required CABG with LIMA-LAD, SVG-LCx and SVG-D1 and complicated by cardiogenic shock b. cath in 05/2020 showing severe RCA stenosis and treated with orbital atherectomy and stent placement   GERD (gastroesophageal reflux disease)    History of CVA (cerebrovascular accident)    History of lung cancer 2020   Peripheral vascular disease ([place])    S/P TAVR (transcatheter aortic valve replacement) 08/12/2020   s/p TAVR with a 23 mm Edwards S3U via the TF approach by Dr. Angelena Form & Dr. Cyndia Bent   Severe aortic stenosis      Significant Hospital  Events: Including procedures, antibiotic start and stop dates in addition to other pertinent events   2/11 admitted to [facility] ED, multifocal pneumonia, intubated, AKI, hyperkalemia; Transferring to [facility]. PCCM consulted  Interim History / Subjective:  No events. Son at bedside. On low dose levo. Some hypoglycemia overnight fixed with some low dose IV dextrose.  Objective   Blood pressure (!) 93/43, pulse 78, temperature 98.4 F (36.9 C), temperature source Axillary, resp. rate (!) 24, height 5\' 4"  (1.626 m), weight 67 kg, SpO2 100 %.    Vent Mode: PRVC FiO2 (%):  [50 %-100 %] 50 % Set Rate:  [24 bmp] 24 bmp Vt Set:  [440 mL] 440 mL PEEP:  [5 cmH20] 5 cmH20 Plateau Pressure:  [18 cmH20-21 cmH20] 21 cmH20   Intake/Output Summary (Last 24 hours) at 03/29/2022 0815 Last data filed at 03/29/2022 0600 Gross per 24 hour  Intake 3730.51 ml  Output 545 ml  Net 3185.51 ml    Filed Weights   03/28/22 1615 03/28/22 2132 03/29/22 0500  Weight: 63.5 kg 66.6 kg 67 kg    Examination: No distress, heavily sedated Wheezing worse on R Passive on vent Abd soft Ext warm No edema  CT Head neg CT C/A/P personally reviewed  Resolved Hospital Problem list   Hyperkalemia- resolved wit medical treatment Hypoglycemia   Assessment & Plan:  Acute respiratory failure w/ hypercarbia Hypercarbic encephalopathy- initial head CT neg; O2 levels may have been low at home so there is risk for anoxic enc Coronavirus ARDS- not COVID; Pct  mildly up so will treat for concurrent CAP Sedation related shock- stable COPD exacerbation Hx anxiety Hx TAVR Hx Lung cancer post lobectomy Type II NSTEMI CAD HTN Aortic stenosis s/p TAVR Hx of CVA Hx of lung cancer GERD Small hiatal hernia  - Resume aspirin - Levo to MAP 65 - Ceftriaxone/azithromycin - Steroids, LABA/LAMA/ICS nebs with PRN SABA - Vent bundle, lighten sedation and see how/if wakes up - Start SSI - Resume PTA xanax - Son updated at  bedside  Best practice:  Diet: npo, TF if does not wake up Pain/Anxiety/Delirium protocol (if indicated): in place VAP protocol (if indicated): in place DVT prophylaxis: lovenox GI prophylaxis: PPI (PTA) Glucose control: SSI Mobility: BR Code Status: full Family Communication: son at bedside 2/12   38 min cc time Erskine Emery MD  Best Practice (right click and "Reselect all SmartList Selections" daily)   Diet/type: NPO w/ meds via tube DVT prophylaxis: SCD; if ct head negative for bleed consider subcu heparin or lovenox GI prophylaxis: PPI Lines: N/A Foley:  N/A Code Status:  full code Last date of multidisciplinary goals of care discussion [Unable to reach spouse over phone. Spoke with son over phone and updated. Would like to keep patient full code.]  Labs   CBC: Recent Labs  Lab 03/28/22 1549 03/28/22 1551 03/28/22 2249 03/29/22 0542  WBC 20.7*  --   --  10.6*  NEUTROABS 17.6*  --   --   --   HGB 13.2 15.3* 11.6* 11.2*  HCT 44.8 45.0 34.0* 37.6  MCV 90.5  --   --  87.9  PLT 199  --   --  152     Basic Metabolic Panel: Recent Labs  Lab 03/28/22 1549 03/28/22 1551 03/28/22 1946 03/28/22 2249 03/29/22 0542  NA 135 134* 134* 136 136  K 6.1* 6.3* 4.5 4.6 5.0  CL 94* 96* 96*  --  96*  CO2 29  --  29  --  26  GLUCOSE 87 87 93  --  166*  BUN 38* 49* 31*  --  24*  CREATININE 0.83 0.80 0.65  --  0.78  CALCIUM 8.8*  --  8.0*  --  8.6*  MG  --   --   --   --  3.3*    GFR: Estimated Creatinine Clearance: 63.3 mL/min (by C-G formula based on SCr of 0.78 mg/dL). Recent Labs  Lab 03/28/22 1549 03/28/22 1753 03/28/22 1946 03/28/22 2320 03/29/22 0255 03/29/22 0542  PROCALCITON  --   --  1.48 1.36  --   --   WBC 20.7*  --   --   --   --  10.6*  LATICACIDVEN 0.8 2.0*  --  1.3 1.0  --      Liver Function Tests: Recent Labs  Lab 03/28/22 1549 03/29/22 0542  AST 163* 78*  ALT 166* 105*  ALKPHOS 147* 117  BILITOT 1.9* 1.1  PROT 7.3 5.6*  ALBUMIN 3.3*  2.3*    No results for input(s): "LIPASE", "AMYLASE" in the last 168 hours. Recent Labs  Lab 03/28/22 2320  AMMONIA 39*    ABG    Component Value Date/Time   PHART 7.389 03/28/2022 2249   PCO2ART 51.1 (H) 03/28/2022 2249   PO2ART 117 (H) 03/28/2022 2249   HCO3 30.5 (H) 03/28/2022 2249   TCO2 32 03/28/2022 2249   ACIDBASEDEF 8.7 (H) 03/28/2022 1616   O2SAT 98 03/28/2022 2249     Coagulation Profile: Recent Labs  Lab 03/28/22 1549  INR 1.2     Cardiac Enzymes: No results for input(s): "CKTOTAL", "CKMB", "CKMBINDEX", "TROPONINI" in the last 168 hours.  HbA1C: Hgb A1c MFr Bld  Date/Time Value Ref Range Status  12/22/2017 03:35 PM 6.2 (H) 4.8 - 5.6 % Final    Comment:    (NOTE) Pre diabetes:          5.7%-6.4% Diabetes:              >6.4% Glycemic control for   <7.0% adults with diabetes   12/20/2016 03:04 PM 6.0 (H) 4.8 - 5.6 % Final    Comment:    (NOTE) Pre diabetes:          5.7%-6.4% Diabetes:              >6.4% Glycemic control for   <7.0% adults with diabetes     CBG: Recent Labs  Lab 03/28/22 1546 03/28/22 2122 03/29/22 0354 03/29/22 0722  GLUCAP 87 99 145* 177*     Review of Systems:   Patient is encephalopathic and/or intubated. Therefore history has been obtained from chart review.    Past Medical History:  She,  has a past medical history of Anxiety, Asthma, COPD (chronic obstructive pulmonary disease) ([place]), Coronary artery disease, GERD (gastroesophageal reflux disease), History of CVA (cerebrovascular accident), History of lung cancer (2020), Peripheral vascular disease ([place]), S/P TAVR (transcatheter aortic valve replacement) (08/12/2020), and Severe aortic stenosis.   Surgical History:   Past Surgical History:  Procedure Laterality Date   AORTIC ARCH ANGIOGRAPHY N/A 11/04/2017   Procedure: AORTIC ARCH ANGIOGRAPHY;  Surgeon: Elam Dutch, MD;  Location: [place] [facility];  Service: Cardiovascular;  Laterality: N/A;    APPLICATION OF WOUND VAC N/A 01/06/2018   Procedure: APPLICATION OF WOUND VAC;  Surgeon: Ivin Poot, MD;  Location: [place];  Service: Thoracic;  Laterality: N/A;   APPLICATION OF WOUND VAC N/A 01/13/2018   Procedure: WOUND VAC CHANGE;  Surgeon: Ivin Poot, MD;  Location: [place];  Service: Thoracic;  Laterality: N/A;   CARDIAC CATHETERIZATION     CAROTID-SUBCLAVIAN BYPASS GRAFT Right 12/26/2017   Procedure: AORTIC TO RIGHT COMMON CAROTID AND RIGHT SUBCLAVIAN  ARTERY  BYPASS;  Surgeon: Elam Dutch, MD;  Location: [place];  Service: Vascular;  Laterality: Right;   CHOLECYSTECTOMY     CLOSURE OF DIAPHRAGM  12/26/2017   Procedure: REPAIR OF DIAPHRAGM;  Surgeon: Ivin Poot, MD;  Location: [place];  Service: Thoracic;;   COLONOSCOPY N/A 10/03/2014   Procedure: COLONOSCOPY;  Surgeon: Rogene Houston, MD;  [facility];  Service: Endoscopy;  Laterality: N/A;  730   CORONARY ARTERY BYPASS GRAFT N/A 12/14/2016   Procedure: CORONARY ARTERY BYPASS GRAFTING (CABG) x three , using left internal mammary artery and right leg greater saphenous vein harvested endoscopically;  Surgeon: Ivin Poot, MD;  Location: [place];  Service: Open Heart Surgery;  Laterality: N/A;   CORONARY ATHERECTOMY N/A 06/11/2020   Procedure: CORONARY ATHERECTOMY;  Surgeon: Burnell Blanks, MD;  Location: [place] [facility];  Service: Cardiovascular;  Laterality: N/A;   CORONARY STENT INTERVENTION N/A 06/11/2020   Procedure: CORONARY STENT INTERVENTION;  Surgeon: Burnell Blanks, MD;  Location: [place] [facility];  Service: Cardiovascular;  Laterality: N/A;   ENDARTERECTOMY Right 12/06/2014   Procedure: ENDARTERECTOMY CAROTid;  Surgeon: Elam Dutch, MD;  Location: [facility];  Service: Vascular;  Laterality: Right;   ESOPHAGOGASTRODUODENOSCOPY N/A 12/03/2016   Procedure: ESOPHAGOGASTRODUODENOSCOPY (EGD);  Surgeon: Rogene Houston, MD;  [facility];  Service: Endoscopy;  Laterality:  N/A;  7:30   INTRAVASCULAR IMAGING/OCT N/A 06/11/2020   Procedure: INTRAVASCULAR IMAGING/OCT;  Surgeon: Burnell Blanks, MD;  Location: [place] [facility];  Service: Cardiovascular;  Laterality: N/A;   INTRAVASCULAR PRESSURE WIRE/FFR STUDY N/A 06/04/2020   Procedure: INTRAVASCULAR PRESSURE WIRE/FFR STUDY;  Surgeon: Burnell Blanks, MD;  Location: [place] [facility];  Service: Cardiovascular;  Laterality: N/A;   LEFT HEART CATH AND CORONARY ANGIOGRAPHY N/A 12/14/2016   Procedure: LEFT HEART CATH AND CORONARY ANGIOGRAPHY;  Surgeon: Burnell Blanks, MD;  Location: [place] [facility];  Service: Cardiovascular;  Laterality: N/A;   LOBECTOMY Right 10/26/2018   Procedure: RIGHT UPPER LOBECTOMY;  Surgeon: Lajuana Matte, MD;  Location: [place];  Service: Thoracic;  Laterality: Right;   PERIPHERAL VASCULAR CATHETERIZATION N/A 12/05/2014   Procedure:  Carotid Arch Angiography;  Surgeon: Conrad [place], MD;  Location: [place] [facility];  Service: Cardiovascular;  Laterality: N/A;   RIGHT/LEFT HEART CATH AND CORONARY/GRAFT ANGIOGRAPHY N/A 06/04/2020   Procedure: RIGHT/LEFT HEART CATH AND CORONARY/GRAFT ANGIOGRAPHY;  Surgeon: Burnell Blanks, MD;  Location: [place] [facility];  Service: Cardiovascular;  Laterality: N/A;   STERNAL WOUND DEBRIDEMENT N/A 01/06/2018   Procedure: STERNAL WOUND DEBRIDEMENT;  Surgeon: Ivin Poot, MD;  Location: [place];  Service: Thoracic;  Laterality: N/A;   STERNOTOMY N/A 12/26/2017   Procedure: REDO STERNOTOMY;  Surgeon: Prescott Gum, Collier Salina, MD;  Location: [place];  Service: Thoracic;  Laterality: N/A;   TEE WITHOUT CARDIOVERSION N/A 12/14/2016   Procedure: TRANSESOPHAGEAL ECHOCARDIOGRAM (TEE);  Surgeon: Prescott Gum, Collier Salina, MD;  Location: [place];  Service: Open Heart Surgery;  Laterality: N/A;   TEE WITHOUT CARDIOVERSION N/A 08/12/2020   Procedure: TRANSESOPHAGEAL ECHOCARDIOGRAM (TEE);  Surgeon: Burnell Blanks, MD;  Location: [place] [facility];   Service: Open Heart Surgery;  Laterality: N/A;   TRANSCATHETER AORTIC VALVE REPLACEMENT, TRANSFEMORAL N/A 08/12/2020   Procedure: TRANSCATHETER AORTIC VALVE REPLACEMENT, TRANSFEMORAL;  Surgeon: Burnell Blanks, MD;  Location: [place] [facility];  Service: Open Heart Surgery;  Laterality: N/A;   VIDEO ASSISTED THORACOSCOPY (VATS)/WEDGE RESECTION Right 10/26/2018   Procedure: VIDEO ASSISTED THORACOSCOPY (VATS)/WEDGE RESECTION;  Surgeon: Lajuana Matte, MD;  Location: [place];  Service: Thoracic;  Laterality: Right;   VIDEO BRONCHOSCOPY N/A 10/26/2018   Procedure: VIDEO BRONCHOSCOPY;  Surgeon: Lajuana Matte, MD;  Location: [place];  Service: Thoracic;  Laterality: N/A;     Social History:   reports that she has been smoking cigarettes. She has never used smokeless tobacco. She reports that she does not drink alcohol and does not use drugs.   Family History:  Her family history includes Cancer in her mother; Diabetes in her mother; Heart attack in her father and mother; Heart disease in her father and mother.   Allergies No Known Allergies   Home Medications  Prior to Admission medications   Medication Sig Start Date End Date Taking? Authorizing Provider  albuterol (PROVENTIL) (2.5 MG/3ML) 0.083% nebulizer solution Take 2.5 mg by nebulization every 4 (four) hours as needed for wheezing or shortness of breath. 03/30/19   [provider]  albuterol (VENTOLIN HFA) 108 (90 Base) MCG/ACT inhaler Inhale 1-2 puffs into the lungs every 6 (six) hours as needed for wheezing or shortness of breath.    [provider]  ALPRAZolam Duanne Moron) 0.5 MG tablet Take 0.5 mg by mouth 3 (three) times  daily.    [provider]  Ascorbic Acid (VITAMIN C GUMMIE PO) Chew 500 mg by mouth daily.    [provider]  aspirin EC 81 MG tablet Take 1 tablet (81 mg total) by mouth daily. Swallow whole. 12/21/19   Strader, Fransisco Hertz, PA-C  atorvastatin (LIPITOR) 40 MG tablet Take 1  tablet (40 mg total) daily by mouth. 12/27/16   Gold, Wilder Glade, PA-C  cholecalciferol (VITAMIN D3) 25 MCG (1000 UNIT) tablet Take 1,000 Units by mouth in the morning.    [provider]  diphenhydramine-acetaminophen (TYLENOL PM) 25-500 MG TABS tablet Take 2 tablets by mouth at bedtime.    [provider]  magnesium oxide (MAG-OX) 400 (240 Mg) MG tablet Take 400 mg by mouth daily.    [provider]  metoprolol succinate (TOPROL-XL) 50 MG 24 hr tablet TAKE ONE TABLET (50MG  TOTAL) BY MOUTH DAILY WITH OR IMMEDIATELY FOLLOWING A MEAL 02/22/22   Arnoldo Lenis, MD  Multiple Vitamin (MULTIVITAMIN WITH MINERALS) TABS tablet Take 1 tablet by mouth daily. 12/01/18   Nani Skillern, PA-C  pantoprazole (PROTONIX) 40 MG tablet Take 40 mg by mouth in the morning.    [provider]  potassium chloride SA (KLOR-CON) 20 MEQ tablet Take 20 mEq by mouth in the morning.    [provider]  Tiotropium Bromide-Olodaterol (STIOLTO RESPIMAT) 2.5-2.5 MCG/ACT AERS Inhale 2 puffs into the lungs daily. Patient not taking: Reported on 01/25/2022 02/07/20   Parrett, Fonnie Mu, NP  torsemide (DEMADEX) 20 MG tablet Take 40 mg by mouth See admin instructions. Take 2 tablets (40 mg) by mouth scheduled on Mondays, Tuesdays, Wednesdays, Thursdays, Fridays & Saturdays. (Hold dose on Sundays)    [provider]  Donnal Debar 100-62.5-25 MCG/ACT AEPB SMARTSIG:1 Inhalation Via Inhaler Daily 01/04/22   [provider]     Critical care time: 45 minutes    JD Rexene Agent [facility] 03/29/2022, 8:15 AM  Please see [email or web address] for pager details.  From 7A-7P if no response, please call [phone]. After hours, please call ELink [phone].

## 2022-03-29 NOTE — Progress Notes (Addendum)
Decreased xanax dose to 0.5 mg BID as patient is too groggy to cough upon demand post extubation .  I have asked RT to place am IS in the room and to instruct her on use.

## 2022-03-29 NOTE — Plan of Care (Signed)
  Problem: Education: Goal: Knowledge of General Education information will improve Description: Including pain rating scale, medication(s)/side effects and non-pharmacologic comfort measures 03/29/2022 2253 by Felipa Furnace, RN Outcome: Progressing 03/29/2022 2246 by Felipa Furnace, RN Outcome: Progressing   Problem: Health Behavior/Discharge Planning: Goal: Ability to manage health-related needs will improve 03/29/2022 2253 by Felipa Furnace, RN Outcome: Progressing 03/29/2022 2246 by Felipa Furnace, RN Outcome: Progressing   Problem: Clinical Measurements: Goal: Ability to maintain clinical measurements within normal limits will improve 03/29/2022 2253 by Felipa Furnace, RN Outcome: Progressing 03/29/2022 2246 by Marthe Patch D, RN Outcome: Progressing Goal: Will remain free from infection 03/29/2022 2253 by Felipa Furnace, RN Outcome: Progressing 03/29/2022 2246 by Marthe Patch D, RN Outcome: Progressing Goal: Diagnostic test results will improve 03/29/2022 2253 by Felipa Furnace, RN Outcome: Progressing 03/29/2022 2246 by Marthe Patch D, RN Outcome: Progressing Goal: Respiratory complications will improve 03/29/2022 2253 by Felipa Furnace, RN Outcome: Progressing 03/29/2022 2246 by Marthe Patch D, RN Outcome: Progressing Goal: Cardiovascular complication will be avoided 03/29/2022 2253 by Felipa Furnace, RN Outcome: Progressing 03/29/2022 2246 by Felipa Furnace, RN Outcome: Progressing   Problem: Activity: Goal: Risk for activity intolerance will decrease 03/29/2022 2253 by Felipa Furnace, RN Outcome: Progressing 03/29/2022 2246 by Marthe Patch D, RN Outcome: Progressing   Problem: Nutrition: Goal: Adequate nutrition will be maintained 03/29/2022 2253 by Felipa Furnace, RN Outcome: Progressing 03/29/2022 2246 by Marthe Patch D, RN Outcome: Progressing   Problem: Coping: Goal: Level of anxiety will decrease 03/29/2022 2253 by Felipa Furnace, RN Outcome: Progressing 03/29/2022 2246 by Marthe Patch D, RN Outcome: Progressing   Problem: Elimination: Goal: Will not experience complications related to bowel motility 03/29/2022 2253 by Felipa Furnace, RN Outcome: Progressing 03/29/2022 2246 by Felipa Furnace, RN Outcome: Progressing Goal: Will not experience complications related to urinary retention 03/29/2022 2253 by Felipa Furnace, RN Outcome: Progressing 03/29/2022 2246 by Felipa Furnace, RN Outcome: Progressing   Problem: Pain Managment: Goal: General experience of comfort will improve 03/29/2022 2253 by Felipa Furnace, RN Outcome: Progressing 03/29/2022 2246 by Marthe Patch D, RN Outcome: Progressing   Problem: Safety: Goal: Ability to remain free from injury will improve 03/29/2022 2253 by Felipa Furnace, RN Outcome: Progressing 03/29/2022 2246 by Marthe Patch D, RN Outcome: Progressing   Problem: Skin Integrity: Goal: Risk for impaired skin integrity will decrease 03/29/2022 2253 by Felipa Furnace, RN Outcome: Progressing 03/29/2022 2246 by Felipa Furnace, RN Outcome: Progressing   Problem: Safety: Goal: Non-violent Restraint(s) 03/29/2022 2253 by Marthe Patch D, RN Outcome: Progressing 03/29/2022 2246 by Felipa Furnace, RN Outcome: Progressing

## 2022-03-29 NOTE — Plan of Care (Signed)

## 2022-03-29 NOTE — Progress Notes (Signed)
PHARMACY - PHYSICIAN COMMUNICATION CRITICAL VALUE ALERT - BLOOD CULTURE IDENTIFICATION (BCID)  Emily Rocha is an 67 y.o. female who transferred to [facility] on 03/28/2022 with a chief complaint of respiratory failure.  Assessment: 1 of 4 BCx bottles, aerobic bottle, positive for Staph species, no further speciation nor resistance detected.  Name of physician (or Provider) Contacted: N/A  Current antibiotics: vancomycin, rocephin and azithromycin  Changes to prescribed antibiotics recommended:  Patient is on recommended antibiotics - No changes needed  Monitor micro data and clinical progress to de-escalate as appropriate.  Results for orders placed or performed during the hospital encounter of 03/28/22  Blood Culture ID Panel (Reflexed) (Collected: 03/28/2022  3:49 PM)  Result Value Ref Range   Enterococcus faecalis NOT DETECTED NOT DETECTED   Enterococcus Faecium NOT DETECTED NOT DETECTED   Listeria monocytogenes NOT DETECTED NOT DETECTED   Staphylococcus species DETECTED (A) NOT DETECTED   Staphylococcus aureus (BCID) NOT DETECTED NOT DETECTED   Staphylococcus epidermidis NOT DETECTED NOT DETECTED   Staphylococcus lugdunensis NOT DETECTED NOT DETECTED   Streptococcus species NOT DETECTED NOT DETECTED   Streptococcus agalactiae NOT DETECTED NOT DETECTED   Streptococcus pneumoniae NOT DETECTED NOT DETECTED   Streptococcus pyogenes NOT DETECTED NOT DETECTED   A.calcoaceticus-baumannii NOT DETECTED NOT DETECTED   Bacteroides fragilis NOT DETECTED NOT DETECTED   Enterobacterales NOT DETECTED NOT DETECTED   Enterobacter cloacae complex NOT DETECTED NOT DETECTED   Escherichia coli NOT DETECTED NOT DETECTED   Klebsiella aerogenes NOT DETECTED NOT DETECTED   Klebsiella oxytoca NOT DETECTED NOT DETECTED   Klebsiella pneumoniae NOT DETECTED NOT DETECTED   Proteus species NOT DETECTED NOT DETECTED   Salmonella species NOT DETECTED NOT DETECTED   Serratia marcescens NOT DETECTED NOT  DETECTED   Haemophilus influenzae NOT DETECTED NOT DETECTED   Neisseria meningitidis NOT DETECTED NOT DETECTED   Pseudomonas aeruginosa NOT DETECTED NOT DETECTED   Stenotrophomonas maltophilia NOT DETECTED NOT DETECTED   Candida albicans NOT DETECTED NOT DETECTED   Candida auris NOT DETECTED NOT DETECTED   Candida glabrata NOT DETECTED NOT DETECTED   Candida krusei NOT DETECTED NOT DETECTED   Candida parapsilosis NOT DETECTED NOT DETECTED   Candida tropicalis NOT DETECTED NOT DETECTED   Cryptococcus neoformans/gattii NOT DETECTED NOT DETECTED    Caelin Rayl D. Mina Marble, PharmD, BCPS, [place] 03/29/2022, 5:23 PM

## 2022-03-29 NOTE — Progress Notes (Addendum)
Pharmacy Antibiotic Note  Emily Rocha is a 67 y.o. female admitted on 03/28/2022 with bacteremia.  Pharmacy has been consulted for vancomycin dosing.  Plan: Vancomycin 1500mg  IV x 1 followed by vancomycin 500 q 12h (goal 15-62mcg/mL)  -vanc levels per protocol  -f/u BCID as able   Height: 5\' 4"  (162.6 cm) Weight: 67 kg (147 lb 11.3 oz) IBW/kg (Calculated) : 54.7  Temp (24hrs), Avg:100.6 F (38.1 C), Min:98.4 F (36.9 C), Max:102 F (38.9 C)  Recent Labs  Lab 03/28/22 1549 03/28/22 1551 03/28/22 1753 03/28/22 1946 03/28/22 2320 03/29/22 0255 03/29/22 0542  WBC 20.7*  --   --   --   --   --  10.6*  CREATININE 0.83 0.80  --  0.65  --   --  0.78  LATICACIDVEN 0.8  --  2.0*  --  1.3 1.0  --     Estimated Creatinine Clearance: 63.3 mL/min (by C-G formula based on SCr of 0.78 mg/dL).    No Known Allergies  Antimicrobials this admission: 2/11 CTX, azithro > 2/12 2/12 vanc>   Dose adjustments this admission:   Microbiology results: 2/11 Bcx: 1/4 GPC (pending org/suscept)  2/11 MRSA pos 2/11 strep, flu, hiv, covid19: neg 2/11 COVID-OC43 +   Thank you for allowing pharmacy to be a part of this patient's care.  Wilson Singer, PharmD Clinical Pharmacist 03/29/2022 1:18 PM

## 2022-03-29 NOTE — Progress Notes (Signed)
[facility] Progress Note Patient Name: Emily Rocha DOB: 31-May-1955 MRN: [id number]   Date of Service  03/29/2022  HPI/Events of Note  Critical Trop 3383 (was 1126) Head CT pending  eICU Interventions  If head CT negative for bleed plan to start chemical VTE prophylaxis     Intervention Category Intermediate Interventions: Diagnostic test evaluation  Judd Lien 03/29/2022, 12:22 AM

## 2022-03-30 ENCOUNTER — Inpatient Hospital Stay (HOSPITAL_COMMUNITY): Payer: Medicare HMO

## 2022-03-30 DIAGNOSIS — J9601 Acute respiratory failure with hypoxia: Secondary | ICD-10-CM | POA: Diagnosis not present

## 2022-03-30 DIAGNOSIS — R6521 Severe sepsis with septic shock: Secondary | ICD-10-CM | POA: Diagnosis not present

## 2022-03-30 DIAGNOSIS — R7989 Other specified abnormal findings of blood chemistry: Secondary | ICD-10-CM

## 2022-03-30 DIAGNOSIS — J9602 Acute respiratory failure with hypercapnia: Secondary | ICD-10-CM

## 2022-03-30 DIAGNOSIS — A419 Sepsis, unspecified organism: Secondary | ICD-10-CM | POA: Diagnosis not present

## 2022-03-30 LAB — GLUCOSE, CAPILLARY
Glucose-Capillary: 107 mg/dL — ABNORMAL HIGH (ref 70–99)
Glucose-Capillary: 109 mg/dL — ABNORMAL HIGH (ref 70–99)
Glucose-Capillary: 121 mg/dL — ABNORMAL HIGH (ref 70–99)
Glucose-Capillary: 122 mg/dL — ABNORMAL HIGH (ref 70–99)
Glucose-Capillary: 123 mg/dL — ABNORMAL HIGH (ref 70–99)
Glucose-Capillary: 137 mg/dL — ABNORMAL HIGH (ref 70–99)

## 2022-03-30 LAB — URINE CULTURE
Culture: NO GROWTH
Culture: NO GROWTH

## 2022-03-30 LAB — CBC
HCT: 39.8 % (ref 36.0–46.0)
Hemoglobin: 11.9 g/dL — ABNORMAL LOW (ref 12.0–15.0)
MCH: 26.7 pg (ref 26.0–34.0)
MCHC: 29.9 g/dL — ABNORMAL LOW (ref 30.0–36.0)
MCV: 89.4 fL (ref 80.0–100.0)
Platelets: 187 10*3/uL (ref 150–400)
RBC: 4.45 MIL/uL (ref 3.87–5.11)
RDW: 16.7 % — ABNORMAL HIGH (ref 11.5–15.5)
WBC: 11.7 10*3/uL — ABNORMAL HIGH (ref 4.0–10.5)
nRBC: 0.9 % — ABNORMAL HIGH (ref 0.0–0.2)

## 2022-03-30 LAB — ECHOCARDIOGRAM COMPLETE
AR max vel: 1.75 cm2
AV Area VTI: 1.76 cm2
AV Area mean vel: 1.64 cm2
AV Mean grad: 12 mmHg
AV Peak grad: 22.7 mmHg
Ao pk vel: 2.38 m/s
Area-P 1/2: 4.6 cm2
Height: 64 in
S' Lateral: 3.2 cm
Weight: 2342.17 oz

## 2022-03-30 LAB — MAGNESIUM: Magnesium: 3.5 mg/dL — ABNORMAL HIGH (ref 1.7–2.4)

## 2022-03-30 LAB — BASIC METABOLIC PANEL
Anion gap: 10 (ref 5–15)
BUN: 35 mg/dL — ABNORMAL HIGH (ref 8–23)
CO2: 29 mmol/L (ref 22–32)
Calcium: 8.7 mg/dL — ABNORMAL LOW (ref 8.9–10.3)
Chloride: 99 mmol/L (ref 98–111)
Creatinine, Ser: 0.81 mg/dL (ref 0.44–1.00)
GFR, Estimated: >60 mL/min (ref >60)
Glucose, Bld: 134 mg/dL — ABNORMAL HIGH (ref 70–99)
Potassium: 5.1 mmol/L (ref 3.5–5.1)
Sodium: 138 mmol/L (ref 135–145)

## 2022-03-30 LAB — AMMONIA: Ammonia: 47 umol/L — ABNORMAL HIGH (ref 9–35)

## 2022-03-30 LAB — LEGIONELLA PNEUMOPHILA SEROGP 1 UR AG: L. pneumophila Serogp 1 Ur Ag: NEGATIVE

## 2022-03-30 MED ORDER — ACETAMINOPHEN 325 MG PO TABS
650.0000 mg | ORAL_TABLET | ORAL | Status: DC | PRN
  Administered 2022-04-03: 650 mg via ORAL
  Filled 2022-03-30: qty 2

## 2022-03-30 MED ORDER — POLYETHYLENE GLYCOL 3350 17 G PO PACK: 17.0000 g | PACK | Freq: Every day | ORAL | Status: DC | PRN

## 2022-03-30 MED ORDER — POLYETHYLENE GLYCOL 3350 17 G PO PACK
17.0000 g | PACK | Freq: Every day | ORAL | Status: DC
  Administered 2022-03-30 – 2022-04-06 (×7): 17 g via ORAL
  Filled 2022-03-30 (×8): qty 1

## 2022-03-30 MED ORDER — ORAL CARE MOUTH RINSE: 15.0000 mL | OROMUCOSAL | Status: DC | PRN

## 2022-03-30 MED ORDER — ORAL CARE MOUTH RINSE: 15.0000 mL | OROMUCOSAL | Status: DC

## 2022-03-30 MED ORDER — ARFORMOTEROL TARTRATE 15 MCG/2ML IN NEBU
15.0000 ug | INHALATION_SOLUTION | Freq: Two times a day (BID) | RESPIRATORY_TRACT | Status: DC
  Administered 2022-03-30 – 2022-04-06 (×14): 15 ug via RESPIRATORY_TRACT
  Filled 2022-03-30 (×17): qty 2

## 2022-03-30 MED ORDER — METOPROLOL TARTRATE 25 MG PO TABS
25.0000 mg | ORAL_TABLET | Freq: Two times a day (BID) | ORAL | Status: DC
  Administered 2022-03-30 (×2): 25 mg via ORAL
  Filled 2022-03-30 (×2): qty 1

## 2022-03-30 MED ORDER — PANTOPRAZOLE SODIUM 40 MG PO TBEC
40.0000 mg | DELAYED_RELEASE_TABLET | Freq: Every day | ORAL | Status: DC
  Administered 2022-03-30 – 2022-04-06 (×8): 40 mg via ORAL
  Filled 2022-03-30 (×8): qty 1

## 2022-03-30 MED ORDER — NICOTINE 14 MG/24HR TD PT24
14.0000 mg | MEDICATED_PATCH | Freq: Every day | TRANSDERMAL | Status: DC
  Administered 2022-03-30 – 2022-04-05 (×7): 14 mg via TRANSDERMAL
  Filled 2022-03-30 (×8): qty 1

## 2022-03-30 MED ORDER — ALPRAZOLAM 0.25 MG PO TABS
0.2500 mg | ORAL_TABLET | Freq: Three times a day (TID) | ORAL | Status: DC | PRN
  Administered 2022-04-01 – 2022-04-05 (×3): 0.25 mg via ORAL
  Filled 2022-03-30 (×3): qty 1

## 2022-03-30 MED ORDER — AZITHROMYCIN 500 MG PO TABS
500.0000 mg | ORAL_TABLET | Freq: Every day | ORAL | Status: DC
  Administered 2022-03-30: 500 mg via ORAL
  Filled 2022-03-30: qty 1

## 2022-03-30 MED ORDER — METOPROLOL TARTRATE 5 MG/5ML IV SOLN: 5.0000 mg | INTRAVENOUS | Status: DC | PRN

## 2022-03-30 MED ORDER — VANCOMYCIN HCL 750 MG/150ML IV SOLN
750.0000 mg | Freq: Two times a day (BID) | INTRAVENOUS | Status: DC
  Administered 2022-03-30 – 2022-03-31 (×2): 750 mg via INTRAVENOUS
  Filled 2022-03-30 (×3): qty 150

## 2022-03-30 MED ORDER — ASPIRIN 81 MG PO CHEW
81.0000 mg | CHEWABLE_TABLET | Freq: Every day | ORAL | Status: DC
  Administered 2022-03-30 – 2022-04-06 (×8): 81 mg via ORAL
  Filled 2022-03-30 (×8): qty 1

## 2022-03-30 MED ORDER — FUROSEMIDE 10 MG/ML IJ SOLN
40.0000 mg | Freq: Once | INTRAMUSCULAR | Status: AC
  Administered 2022-03-30: 40 mg via INTRAVENOUS
  Filled 2022-03-30: qty 4

## 2022-03-30 MED ORDER — INSULIN ASPART 100 UNIT/ML IJ SOLN
0.0000 [IU] | Freq: Three times a day (TID) | INTRAMUSCULAR | Status: DC
  Administered 2022-03-30 – 2022-03-31 (×5): 2 [IU] via SUBCUTANEOUS
  Administered 2022-04-01: 3 [IU] via SUBCUTANEOUS
  Administered 2022-04-01: 2 [IU] via SUBCUTANEOUS
  Administered 2022-04-02: 3 [IU] via SUBCUTANEOUS
  Administered 2022-04-02: 5 [IU] via SUBCUTANEOUS
  Administered 2022-04-02: 3 [IU] via SUBCUTANEOUS
  Administered 2022-04-03: 2 [IU] via SUBCUTANEOUS
  Administered 2022-04-03: 8 [IU] via SUBCUTANEOUS
  Administered 2022-04-04 – 2022-04-05 (×3): 3 [IU] via SUBCUTANEOUS
  Administered 2022-04-05: 2 [IU] via SUBCUTANEOUS

## 2022-03-30 MED ORDER — MUPIROCIN 2 % EX OINT
1.0000 | TOPICAL_OINTMENT | Freq: Two times a day (BID) | CUTANEOUS | Status: AC
  Administered 2022-03-30 – 2022-04-03 (×10): 1 via NASAL
  Filled 2022-03-30 (×3): qty 22

## 2022-03-30 MED ORDER — SODIUM CHLORIDE 0.9 % IV SOLN
2.0000 g | INTRAVENOUS | Status: DC
  Administered 2022-03-30 – 2022-04-01 (×3): 2 g via INTRAVENOUS
  Filled 2022-03-30 (×3): qty 20

## 2022-03-30 MED ORDER — GUAIFENESIN ER 600 MG PO TB12
600.0000 mg | ORAL_TABLET | Freq: Two times a day (BID) | ORAL | Status: DC
  Administered 2022-03-30 – 2022-04-06 (×15): 600 mg via ORAL
  Filled 2022-03-30 (×15): qty 1

## 2022-03-30 MED ORDER — PERFLUTREN LIPID MICROSPHERE
1.0000 mL | INTRAVENOUS | Status: AC | PRN
  Administered 2022-03-30: 4 mL via INTRAVENOUS

## 2022-03-30 MED ORDER — METOPROLOL TARTRATE 25 MG PO TABS
37.5000 mg | ORAL_TABLET | Freq: Two times a day (BID) | ORAL | Status: DC
  Administered 2022-03-31 – 2022-04-06 (×11): 37.5 mg via ORAL
  Filled 2022-03-30 (×13): qty 1

## 2022-03-30 MED ORDER — LACTULOSE 10 GM/15ML PO SOLN
10.0000 g | Freq: Two times a day (BID) | ORAL | Status: DC
  Administered 2022-03-30 (×2): 10 g via ORAL
  Filled 2022-03-30 (×2): qty 15

## 2022-03-30 MED ORDER — CHLORHEXIDINE GLUCONATE CLOTH 2 % EX PADS
6.0000 | MEDICATED_PAD | Freq: Every day | CUTANEOUS | Status: AC
  Administered 2022-03-31 – 2022-04-04 (×5): 6 via TOPICAL

## 2022-03-30 MED ORDER — ALBUTEROL SULFATE (2.5 MG/3ML) 0.083% IN NEBU
2.5000 mg | INHALATION_SOLUTION | RESPIRATORY_TRACT | Status: DC | PRN
  Administered 2022-03-31 – 2022-04-02 (×2): 2.5 mg via RESPIRATORY_TRACT
  Filled 2022-03-30 (×2): qty 3

## 2022-03-30 NOTE — Progress Notes (Addendum)
NAME:  Emily Rocha, MRN:  [id number], DOB:  16-Feb-1955, LOS: 2 ADMISSION DATE:  03/28/2022, CONSULTATION DATE:  2/11 REFERRING MD:  Dr. Gilford Raid, CHIEF COMPLAINT:  multifocal pneumonia   History of Present Illness:  Patient is a 67 yo F w/ pertinent PMH of COPD, Asthma, CAD, CVA, lung cancer, aortic stenosis s/p TAVR admitted to [facility] ED 2/11 with respiratory failure.   Patient has been sick since 2/7 and having to use supplemental O2 at home that she usually doesn't have to use. On 2/10 patient minimally responsive and confused. 2/11 worsening mental status. EMS called and transferred patient to [facility] ED 2/11. Vitals stable. Patient obtunded/minimally responsive. O2 sat low on NRB. Patient intubated. CXR showing multifocal opacities concerning for multifocal pneumonia. Tmax 102 F/wbc 20.7.  Started on Rocephin/azithromycin.  Cultures obtained. IV fluids given. COVID, flu, RSV negative. UA rare bacteria.  LA 0.8 then 2.0.  ABG 7.18, 54, 60, 19.5.  Post intubation hypotension requiring low dose levo. Patient being transported to [facility].  PCCM consulted.  Pertinent ED labs: K6.1, glucose 87, creatinine 0.83, mild elevated LFTs, troponin 1184 then 1126.  Pertinent  Medical History   Past Medical History:  Diagnosis Date   Anxiety    Asthma    COPD (chronic obstructive pulmonary disease) ([place])    Coronary artery disease    a. s/p NSTEMI in 11/2016 and required CABG with LIMA-LAD, SVG-LCx and SVG-D1 and complicated by cardiogenic shock b. cath in 05/2020 showing severe RCA stenosis and treated with orbital atherectomy and stent placement   GERD (gastroesophageal reflux disease)    History of CVA (cerebrovascular accident)    History of lung cancer 2020   Peripheral vascular disease ([place])    S/P TAVR (transcatheter aortic valve replacement) 08/12/2020   s/p TAVR with a 23 mm Edwards S3U via the TF approach by Dr. Angelena Form & Dr. Cyndia Bent   Severe aortic stenosis    - 2/13> per husband, still smokes,  has not used her home O2 in months.  Still works as of last week, manages a tax firm   Significant Hospital Events: Including procedures, antibiotic start and stop dates in addition to other pertinent events   2/11 admitted to [facility] ED, multifocal pneumonia, intubated, AKI, hyperkalemia; Transferring to [facility]. PCCM consulted 2/12 off pressors, extubated   Interim History / Subjective:  Off pressors Afebrile  Passed bedside swallow.  Wants water Denies pain or SOB  Objective   Blood pressure (!) 144/56, pulse 97, temperature 97.6 F (36.4 C), temperature source Oral, resp. rate (!) 28, height 5\' 4"  (1.626 m), weight 66.4 kg, SpO2 95 %.    Vent Mode: BIPAP FiO2 (%):  [40 %-50 %] 40 % Set Rate:  [8 bmp] 8 bmp PEEP:  [5 cmH20] 5 cmH20 Pressure Support:  [12 cmH20] 12 cmH20   Intake/Output Summary (Last 24 hours) at 03/30/2022 0806 Last data filed at 03/30/2022 2202 Gross per 24 hour  Intake 1050.18 ml  Output 1110 ml  Net -59.82 ml   Filed Weights   03/28/22 2132 03/29/22 0500 03/30/22 0329  Weight: 66.6 kg 67 kg 66.4 kg    Examination: General:  acute on chronically ill appearing female sitting in bedside recliner HEENT: MM pink/moist, pupils 3/reactive, very HOH Neuro:  Alert, oriented to place, person, not time/ year, MAE- generalized weakness CV: rr, ST 105, no murmur PULM:  mild tachypnea and WOB after getting into recliner, some audible rhonchi/ wheezing, scattered rhonchi/ wheeze, diminished, good cough, non  productive thus far, only able to pull 250 on IS, on [place] 5L GI: soft, bs+, NT, foley- cyu Extremities: warm/dry, no LE edema  Skin:  diffuse mild erythema, no rash  UOP 1L/ 24rs  Net +3.1L  Afebrile Labs > 138, K 5.1, sCr 0.81, Mag 3.5, ammonia 47, WBC 11.7, H/H 11.9/ 29   Resolved Hospital Problem list   Hyperkalemia- resolved wit medical treatment Hypoglycemia   Assessment & Plan:  Acute on chronic hypoxic respiratory failure w/ hypercarbia Coronavirus  ARDS- not COVID; Pct mildly up so will treat for concurrent CAP COPD exacerbation Tobacco abuse> ongoing per husband, pt will deny  Hx Lung cancer post lobectomy Plan - s/p extubation 2/12.  Continue to wean supplemental O2 for sat goal 88-94% - prn BiPAP for increased WOB - cont solumedrol 40mg  q 12 given ongoing wheezing - cont ceftriaxone, azithro, and vanc pending culture data.  Trach asp with GPC - urine legionella pending.  Strep neg - cont pulmicort, yulperi, add brovanna, prn albuterol  - aggressive IS/ flutter.  Adding guaifenesin, tessalon.  PT consult/ mobilize  - lasix 40mg  x 1 - SLP  - consider tx to PCU later this evening vs watching one more night in ICU pending respiratory status   Shock, related to sedation +/- sepsis  - off pressors.  Continue to monitor.  MAP goal > 65 - trend WBC/ fever  - abx as above - follow cultures - d/c foley  - BC> 1/4 staph, likely contaminant, cont to follow  Hx anxiety - reduce xanax to 0.25mg  TID PRN given oversedation yesterday with home dosing.  Husband unsure how much she takes at home  - delirium precautions   Type II NSTEMI CAD HTN Aortic stenosis s/p TAVR - tele monitoring  - check echo  - ASA  - restart home metoprolol   Hypercarbic/ septic encephalopathy- initial head CT neg; O2 levels may have been low at home so there is risk for anoxic enc Hx of CVA - serial monitoring - remains confused.  Possible component of hepatic encephalopathy with mild elevated ammonia, but appears chronic since 2021.  Lactulose BID today, recheck in am   GERD Small hiatal hernia - PPI - aspiration precautions   Elevated LFTs ?chronic elevated ammonia  - down trending, ?related to shock  - noted ammonia levels mildly elevated since 2021 with normal liver imaging.  - - lactulose and recheck ammonia in am   Best practice:  Diet: sLP Pain/Anxiety/Delirium protocol (if indicated): d/c VAP protocol (if indicated): PPI  DVT  prophylaxis: lovenox GI prophylaxis: PPI (PTA) Glucose control: SSI Mobility: BR Code Status: full Family Communication: Husband updated at bedside 2/13.   Labs   CBC: Recent Labs  Lab 03/28/22 1549 03/28/22 1551 03/28/22 2249 03/29/22 0542 03/30/22 0204  WBC 20.7*  --   --  10.6* 11.7*  NEUTROABS 17.6*  --   --   --   --   HGB 13.2 15.3* 11.6* 11.2* 11.9*  HCT 44.8 45.0 34.0* 37.6 39.8  MCV 90.5  --   --  87.9 89.4  PLT 199  --   --  152 419    Basic Metabolic Panel: Recent Labs  Lab 03/28/22 1549 03/28/22 1551 03/28/22 1946 03/28/22 2249 03/29/22 0542 03/30/22 0204  NA 135 134* 134* 136 136 138  K 6.1* 6.3* 4.5 4.6 5.0 5.1  CL 94* 96* 96*  --  96* 99  CO2 29  --  29  --  26 29  GLUCOSE 87 87 93  --  166* 134*  BUN 38* 49* 31*  --  24* 35*  CREATININE 0.83 0.80 0.65  --  0.78 0.81  CALCIUM 8.8*  --  8.0*  --  8.6* 8.7*  MG  --   --   --   --  3.3* 3.5*   GFR: Estimated Creatinine Clearance: 62.3 mL/min (by C-G formula based on SCr of 0.81 mg/dL). Recent Labs  Lab 03/28/22 1549 03/28/22 1753 03/28/22 1946 03/28/22 2320 03/29/22 0255 03/29/22 0542 03/30/22 0204  PROCALCITON  --   --  1.48 1.36  --   --   --   WBC 20.7*  --   --   --   --  10.6* 11.7*  LATICACIDVEN 0.8 2.0*  --  1.3 1.0  --   --     Liver Function Tests: Recent Labs  Lab 03/28/22 1549 03/29/22 0542  AST 163* 78*  ALT 166* 105*  ALKPHOS 147* 117  BILITOT 1.9* 1.1  PROT 7.3 5.6*  ALBUMIN 3.3* 2.3*   No results for input(s): "LIPASE", "AMYLASE" in the last 168 hours. Recent Labs  Lab 03/28/22 2320 03/30/22 0204  AMMONIA 39* 47*    ABG    Component Value Date/Time   PHART 7.389 03/28/2022 2249   PCO2ART 51.1 (H) 03/28/2022 2249   PO2ART 117 (H) 03/28/2022 2249   HCO3 30.5 (H) 03/28/2022 2249   TCO2 32 03/28/2022 2249   ACIDBASEDEF 8.7 (H) 03/28/2022 1616   O2SAT 98 03/28/2022 2249     Coagulation Profile: Recent Labs  Lab 03/28/22 1549  INR 1.2    Cardiac  Enzymes: No results for input(s): "CKTOTAL", "CKMB", "CKMBINDEX", "TROPONINI" in the last 168 hours.  HbA1C: Hgb A1c MFr Bld  Date/Time Value Ref Range Status  12/22/2017 03:35 PM 6.2 (H) 4.8 - 5.6 % Final    Comment:    (NOTE) Pre diabetes:          5.7%-6.4% Diabetes:              >6.4% Glycemic control for   <7.0% adults with diabetes   12/20/2016 03:04 PM 6.0 (H) 4.8 - 5.6 % Final    Comment:    (NOTE) Pre diabetes:          5.7%-6.4% Diabetes:              >6.4% Glycemic control for   <7.0% adults with diabetes     CBG: Recent Labs  Lab 03/29/22 1518 03/29/22 1947 03/29/22 2322 03/30/22 0322 03/30/22 0725  GLUCAP 143* 148* 153* 121* 122*    Critical care time: 32 minutes     Amanda Cockayne [facility] 03/30/2022, 8:06 AM  See Amion for pager If no response to pager, please call PCCM consult pager After 7:00 pm call Elink

## 2022-03-30 NOTE — Plan of Care (Signed)

## 2022-03-30 NOTE — Evaluation (Signed)
Clinical/Bedside Swallow Evaluation Patient Details  Name: Emily Rocha MRN: [id number] Date of Birth: Mar 07, 1955  Today's Date: 03/30/2022 Time: SLP Start Time (ACUTE ONLY): 1106 SLP Stop Time (ACUTE ONLY): 1117 SLP Time Calculation (min) (ACUTE ONLY): 11 min  Past Medical History:  Past Medical History:  Diagnosis Date   Anxiety    Asthma    COPD (chronic obstructive pulmonary disease) ([place])    Coronary artery disease    a. s/p NSTEMI in 11/2016 and required CABG with LIMA-LAD, SVG-LCx and SVG-D1 and complicated by cardiogenic shock b. cath in 05/2020 showing severe RCA stenosis and treated with orbital atherectomy and stent placement   GERD (gastroesophageal reflux disease)    History of CVA (cerebrovascular accident)    History of lung cancer 2020   Peripheral vascular disease ([place])    S/P TAVR (transcatheter aortic valve replacement) 08/12/2020   s/p TAVR with a 23 mm Edwards S3U via the TF approach by Dr. Angelena Form & Dr. Cyndia Bent   Severe aortic stenosis    Past Surgical History:  Past Surgical History:  Procedure Laterality Date   AORTIC ARCH ANGIOGRAPHY N/A 11/04/2017   Procedure: AORTIC ARCH ANGIOGRAPHY;  Surgeon: Elam Dutch, MD;  Location: [place] [facility];  Service: Cardiovascular;  Laterality: N/A;   APPLICATION OF WOUND VAC N/A 01/06/2018   Procedure: APPLICATION OF WOUND VAC;  Surgeon: Ivin Poot, MD;  Location: [place];  Service: Thoracic;  Laterality: N/A;   APPLICATION OF WOUND VAC N/A 01/13/2018   Procedure: WOUND VAC CHANGE;  Surgeon: Ivin Poot, MD;  Location: [place];  Service: Thoracic;  Laterality: N/A;   CARDIAC CATHETERIZATION     CAROTID-SUBCLAVIAN BYPASS GRAFT Right 12/26/2017   Procedure: AORTIC TO RIGHT COMMON CAROTID AND RIGHT SUBCLAVIAN  ARTERY  BYPASS;  Surgeon: Elam Dutch, MD;  Location: [place];  Service: Vascular;  Laterality: Right;   CHOLECYSTECTOMY     CLOSURE OF DIAPHRAGM  12/26/2017   Procedure: REPAIR OF DIAPHRAGM;   Surgeon: Ivin Poot, MD;  Location: [place];  Service: Thoracic;;   COLONOSCOPY N/A 10/03/2014   Procedure: COLONOSCOPY;  Surgeon: Rogene Houston, MD;  [facility];  Service: Endoscopy;  Laterality: N/A;  730   CORONARY ARTERY BYPASS GRAFT N/A 12/14/2016   Procedure: CORONARY ARTERY BYPASS GRAFTING (CABG) x three , using left internal mammary artery and right leg greater saphenous vein harvested endoscopically;  Surgeon: Ivin Poot, MD;  Location: [place];  Service: Open Heart Surgery;  Laterality: N/A;   CORONARY ATHERECTOMY N/A 06/11/2020   Procedure: CORONARY ATHERECTOMY;  Surgeon: Burnell Blanks, MD;  Location: [place] [facility];  Service: Cardiovascular;  Laterality: N/A;   CORONARY STENT INTERVENTION N/A 06/11/2020   Procedure: CORONARY STENT INTERVENTION;  Surgeon: Burnell Blanks, MD;  Location: [place] [facility];  Service: Cardiovascular;  Laterality: N/A;   ENDARTERECTOMY Right 12/06/2014   Procedure: ENDARTERECTOMY CAROTid;  Surgeon: Elam Dutch, MD;  Location: [facility] OR;  Service: Vascular;  Laterality: Right;   ESOPHAGOGASTRODUODENOSCOPY N/A 12/03/2016   Procedure: ESOPHAGOGASTRODUODENOSCOPY (EGD);  Surgeon: Rogene Houston, MD;  [facility];  Service: Endoscopy;  Laterality: N/A;  7:30   INTRAVASCULAR IMAGING/OCT N/A 06/11/2020   Procedure: INTRAVASCULAR IMAGING/OCT;  Surgeon: Burnell Blanks, MD;  Location: [place] [facility];  Service: Cardiovascular;  Laterality: N/A;   INTRAVASCULAR PRESSURE WIRE/FFR STUDY N/A 06/04/2020   Procedure: INTRAVASCULAR PRESSURE WIRE/FFR STUDY;  Surgeon: Burnell Blanks, MD;  Location: [place] [facility];  Service: Cardiovascular;  Laterality: N/A;   LEFT HEART CATH AND CORONARY ANGIOGRAPHY N/A 12/14/2016   Procedure: LEFT HEART CATH AND CORONARY ANGIOGRAPHY;  Surgeon: Burnell Blanks, MD;  Location: [place] [facility];  Service: Cardiovascular;  Laterality: N/A;   LOBECTOMY Right  10/26/2018   Procedure: RIGHT UPPER LOBECTOMY;  Surgeon: Lajuana Matte, MD;  Location: [place];  Service: Thoracic;  Laterality: Right;   PERIPHERAL VASCULAR CATHETERIZATION N/A 12/05/2014   Procedure:  Carotid Arch Angiography;  Surgeon: Conrad Shiloh, MD;  Location: [place] [facility];  Service: Cardiovascular;  Laterality: N/A;   RIGHT/LEFT HEART CATH AND CORONARY/GRAFT ANGIOGRAPHY N/A 06/04/2020   Procedure: RIGHT/LEFT HEART CATH AND CORONARY/GRAFT ANGIOGRAPHY;  Surgeon: Burnell Blanks, MD;  Location: [place] [facility];  Service: Cardiovascular;  Laterality: N/A;   STERNAL WOUND DEBRIDEMENT N/A 01/06/2018   Procedure: STERNAL WOUND DEBRIDEMENT;  Surgeon: Ivin Poot, MD;  Location: [place];  Service: Thoracic;  Laterality: N/A;   STERNOTOMY N/A 12/26/2017   Procedure: REDO STERNOTOMY;  Surgeon: Prescott Gum, Collier Salina, MD;  Location: [place];  Service: Thoracic;  Laterality: N/A;   TEE WITHOUT CARDIOVERSION N/A 12/14/2016   Procedure: TRANSESOPHAGEAL ECHOCARDIOGRAM (TEE);  Surgeon: Prescott Gum, Collier Salina, MD;  Location: [place];  Service: Open Heart Surgery;  Laterality: N/A;   TEE WITHOUT CARDIOVERSION N/A 08/12/2020   Procedure: TRANSESOPHAGEAL ECHOCARDIOGRAM (TEE);  Surgeon: Burnell Blanks, MD;  Location: [place] [facility];  Service: Open Heart Surgery;  Laterality: N/A;   TRANSCATHETER AORTIC VALVE REPLACEMENT, TRANSFEMORAL N/A 08/12/2020   Procedure: TRANSCATHETER AORTIC VALVE REPLACEMENT, TRANSFEMORAL;  Surgeon: Burnell Blanks, MD;  Location: [place] [facility];  Service: Open Heart Surgery;  Laterality: N/A;   VIDEO ASSISTED THORACOSCOPY (VATS)/WEDGE RESECTION Right 10/26/2018   Procedure: VIDEO ASSISTED THORACOSCOPY (VATS)/WEDGE RESECTION;  Surgeon: Lajuana Matte, MD;  Location: [place];  Service: Thoracic;  Laterality: Right;   VIDEO BRONCHOSCOPY N/A 10/26/2018   Procedure: VIDEO BRONCHOSCOPY;  Surgeon: Lajuana Matte, MD;  [facility];  Service: Thoracic;   Laterality: N/A;   HPI:  67 yo F  admitted to [facility] ED 2/11 with respiratory failure.  Pt intubated 2/11 and tx'd to [facility].  Extubated 2/12. CXR showing multifocal opacities concerning for multifocal pneumonia.  PMH of COPD, Asthma, CAD, GERD. CVA, lung cancer, aortic stenosis s/p TAVR. Followed by SLP during Nov 2019 admission, requiring multiple MBS studies due to impaired swallowing, which improved prior to D/C.    Assessment / Plan / Recommendation  Clinical Impression  Pt participated in clinical swallowing assessment; she was seated in recliner, followed oral commands and instructions after repetition due to hearing impairment and mental status changes.  Oral mechanism exam was normal.  Dentures were not present. Thin liquids were swallowed with some delayed belching and throat-clearing noted afterwards.  She swallowed nectar thick liquids and purees without difficulty and with no s/s of aspiration.  Voice is strong; no dysphonia present.  Pt was  pleasant, confused, repetitive.  There were no overt concerns for aspiration during assessment, but given fluctuating MS and tenuous respiratory status, recommend caution with POs today. Continue clear liquids; thicken to nectar; meds whole in puree. SLP will follow to determine readiness to advance diet at bedside vs the need for instrumental study. D/W RN. SLP Visit Diagnosis: Dysphagia, oropharyngeal phase (R13.12)    Aspiration Risk  Other (comment) (tba)    Diet Recommendation   Clears, thicken to  nectar  Medication Administration: Whole meds with puree    Other  Recommendations Oral Care Recommendations: Oral care BID    Recommendations for follow up therapy are one component of a multi-disciplinary discharge planning process, led by the attending physician.  Recommendations may be updated based on patient status, additional functional criteria and insurance authorization.  Follow up Recommendations Other (comment) (tba)           Functional Status Assessment Patient has had a recent decline in their functional status and demonstrates the ability to make significant improvements in function in a reasonable and predictable amount of time.  Frequency and Duration min 2x/week  2 weeks       Prognosis Prognosis for improved oropharyngeal function: Good      Swallow Study   General Date of Onset: 03/28/22 HPI: 67 yo F  admitted to [facility] ED 2/11 with respiratory failure.  Pt intubated 2/11 and tx'd to [facility].  Extubated 2/12. CXR showing multifocal opacities concerning for multifocal pneumonia.  PMH of COPD, Asthma, CAD, GERD. CVA, lung cancer, aortic stenosis s/p TAVR. Followed by SLP during Nov 2019 admission, requiring multiple MBS studies due to impaired swallowing, which improved prior to D/C. Type of Study: Bedside Swallow Evaluation Previous Swallow Assessment: see HPI Diet Prior to this Study: Other (Comment) (clears) Temperature Spikes Noted: No Respiratory Status: Nasal cannula (5L, 40%) History of Recent Intubation: Yes Total duration of intubation (days): 1 days Date extubated: 03/29/22 Behavior/Cognition: Confused;Lethargic/Drowsy Oral Cavity Assessment: Within Functional Limits Oral Care Completed by SLP: Recent completion by staff Oral Cavity - Dentition: Edentulous Self-Feeding Abilities: Needs assist Patient Positioning: Upright in chair Baseline Vocal Quality: Normal Volitional Cough: Wet;Congested Volitional Swallow: Able to elicit    Oral/Motor/Sensory Function Overall Oral Motor/Sensory Function: Within functional limits   Ice Chips Ice chips: Within functional limits   Thin Liquid Thin Liquid: Within functional limits Presentation: Cup;Straw    Nectar Thick Nectar Thick Liquid: Within functional limits   Honey Thick Honey Thick Liquid: Not tested   Puree Puree: Within functional limits   Solid     Solid: Not tested      Juan Quam Laurice 03/30/2022,11:35 AM Estill Bamberg L. Tivis Ringer,  MA CCC/SLP Clinical Specialist - [place] Office number [phone]

## 2022-03-30 NOTE — Progress Notes (Signed)
Patient with sustained tachycardia tele monitor reading as afib. Elink notified and EKG obtained. Patient in sinus rhythm per EKG, scheduled lopressor dose given early per [facility] MD.

## 2022-03-30 NOTE — Progress Notes (Signed)
[facility] Progress Note Patient Name: Emily Rocha DOB: June 09, 1955 MRN: [id number]   Date of Service  03/30/2022  HPI/Events of Note  10 secs of SVT, similar episode on days   Electrolytes stable  Started BB during day shift    eICU Interventions  Continue to monitor for now, no intervention indicated     Intervention Category Intermediate Interventions: Arrhythmia - evaluation and management  Tradarius Reinwald 03/30/2022, 2:56 AM

## 2022-03-30 NOTE — Progress Notes (Signed)
Pt ripped Bipap mask off and stated she doesn't want to wear it.  Pt placed back on [place] by RN.

## 2022-03-30 NOTE — Progress Notes (Signed)
*  PRELIMINARY RESULTS* Echocardiogram 2D Echocardiogram has been performed.  Wynelle Link 03/30/2022, 4:53 PM

## 2022-03-30 NOTE — Progress Notes (Signed)
[facility] Progress Note Patient Name: Emily Rocha DOB: October 31, 1955 MRN: [id number]   Date of Service  03/30/2022  HPI/Events of Note  pt having elevated HR, 120-30s afib.  Has lopressor PO scheduled 2x daily, due at 2200.  eICU Interventions  Early lopressor dose, increase dose, add PRN Metop   0146 - K 5.6, Cr 0.64; one-time Lokelma, 1 tab furosemide.  I's and O's positive over 24 hours.  Renal function stable.   Intervention Category Intermediate Interventions: Arrhythmia - evaluation and management  Trenity Pha 03/30/2022, 8:11 PM

## 2022-03-30 NOTE — Progress Notes (Signed)
Patient with short run of SVT, went back into sinus rhythm on own. Elink notified, AM labs already drawn. Per Elink MD continue to monitor.

## 2022-03-31 ENCOUNTER — Telehealth (HOSPITAL_COMMUNITY): Payer: Self-pay

## 2022-03-31 ENCOUNTER — Other Ambulatory Visit (HOSPITAL_COMMUNITY): Payer: Self-pay

## 2022-03-31 ENCOUNTER — Inpatient Hospital Stay (HOSPITAL_COMMUNITY): Payer: Medicare HMO

## 2022-03-31 DIAGNOSIS — J9601 Acute respiratory failure with hypoxia: Secondary | ICD-10-CM | POA: Diagnosis not present

## 2022-03-31 DIAGNOSIS — R6521 Severe sepsis with septic shock: Secondary | ICD-10-CM | POA: Diagnosis not present

## 2022-03-31 DIAGNOSIS — A419 Sepsis, unspecified organism: Secondary | ICD-10-CM | POA: Diagnosis not present

## 2022-03-31 LAB — GLUCOSE, CAPILLARY
Glucose-Capillary: 126 mg/dL — ABNORMAL HIGH (ref 70–99)
Glucose-Capillary: 126 mg/dL — ABNORMAL HIGH (ref 70–99)
Glucose-Capillary: 150 mg/dL — ABNORMAL HIGH (ref 70–99)
Glucose-Capillary: 123 mg/dL — ABNORMAL HIGH (ref 70–99)
Glucose-Capillary: 116 mg/dL — ABNORMAL HIGH (ref 70–99)
Glucose-Capillary: 117 mg/dL — ABNORMAL HIGH (ref 70–99)

## 2022-03-31 LAB — COMPREHENSIVE METABOLIC PANEL
ALT: 101 U/L — ABNORMAL HIGH (ref 0–44)
AST: 80 U/L — ABNORMAL HIGH (ref 15–41)
Albumin: 2.7 g/dL — ABNORMAL LOW (ref 3.5–5.0)
Alkaline Phosphatase: 145 U/L — ABNORMAL HIGH (ref 38–126)
Anion gap: 9 (ref 5–15)
BUN: 39 mg/dL — ABNORMAL HIGH (ref 8–23)
CO2: 30 mmol/L (ref 22–32)
Calcium: 9 mg/dL (ref 8.9–10.3)
Chloride: 96 mmol/L — ABNORMAL LOW (ref 98–111)
Creatinine, Ser: 0.64 mg/dL (ref 0.44–1.00)
GFR, Estimated: >60 mL/min (ref >60)
Glucose, Bld: 110 mg/dL — ABNORMAL HIGH (ref 70–99)
Potassium: 5.6 mmol/L — ABNORMAL HIGH (ref 3.5–5.1)
Sodium: 135 mmol/L (ref 135–145)
Total Bilirubin: 0.6 mg/dL (ref 0.3–1.2)
Total Protein: 6.6 g/dL (ref 6.5–8.1)

## 2022-03-31 LAB — PHOSPHORUS: Phosphorus: 3.1 mg/dL (ref 2.5–4.6)

## 2022-03-31 LAB — CBC
HCT: 40.4 % (ref 36.0–46.0)
Hemoglobin: 11.8 g/dL — ABNORMAL LOW (ref 12.0–15.0)
MCH: 26.6 pg (ref 26.0–34.0)
MCHC: 29.2 g/dL — ABNORMAL LOW (ref 30.0–36.0)
MCV: 91 fL (ref 80.0–100.0)
Platelets: 207 10*3/uL (ref 150–400)
RBC: 4.44 MIL/uL (ref 3.87–5.11)
RDW: 16.8 % — ABNORMAL HIGH (ref 11.5–15.5)
WBC: 11.3 10*3/uL — ABNORMAL HIGH (ref 4.0–10.5)
nRBC: 1.9 % — ABNORMAL HIGH (ref 0.0–0.2)

## 2022-03-31 LAB — CULTURE, BLOOD (ROUTINE X 2): Special Requests: ADEQUATE

## 2022-03-31 LAB — AMMONIA: Ammonia: 71 umol/L — ABNORMAL HIGH (ref 9–35)

## 2022-03-31 MED ORDER — SODIUM ZIRCONIUM CYCLOSILICATE 10 G PO PACK
10.0000 g | PACK | Freq: Once | ORAL | Status: AC
  Administered 2022-03-31: 10 g via ORAL
  Filled 2022-03-31: qty 1

## 2022-03-31 MED ORDER — LACTULOSE 10 GM/15ML PO SOLN
20.0000 g | Freq: Three times a day (TID) | ORAL | Status: DC
  Administered 2022-03-31 – 2022-04-02 (×7): 20 g via ORAL
  Filled 2022-03-31 (×7): qty 30

## 2022-03-31 MED ORDER — APIXABAN 5 MG PO TABS
5.0000 mg | ORAL_TABLET | Freq: Two times a day (BID) | ORAL | Status: DC
  Administered 2022-03-31 – 2022-04-06 (×13): 5 mg via ORAL
  Filled 2022-03-31 (×13): qty 1

## 2022-03-31 MED ORDER — TORSEMIDE 20 MG PO TABS
20.0000 mg | ORAL_TABLET | Freq: Every day | ORAL | Status: DC
  Filled 2022-03-31: qty 1

## 2022-03-31 MED ORDER — TORSEMIDE 20 MG PO TABS
40.0000 mg | ORAL_TABLET | Freq: Every day | ORAL | Status: DC
  Administered 2022-03-31 – 2022-04-03 (×4): 40 mg via ORAL
  Filled 2022-03-31 (×4): qty 2

## 2022-03-31 MED ORDER — FUROSEMIDE 10 MG/ML IJ SOLN
40.0000 mg | Freq: Once | INTRAMUSCULAR | Status: AC
  Administered 2022-03-31: 40 mg via INTRAVENOUS
  Filled 2022-03-31: qty 4

## 2022-03-31 MED ORDER — PREDNISONE 20 MG PO TABS
40.0000 mg | ORAL_TABLET | Freq: Every day | ORAL | Status: DC
  Administered 2022-03-31 – 2022-04-01 (×2): 40 mg via ORAL
  Filled 2022-03-31 (×2): qty 2

## 2022-03-31 NOTE — Evaluation (Signed)
Physical Therapy Evaluation Patient Details Name: Emily Rocha MRN: [id number] DOB: [birth year] Today's Date: 03/31/2022  History of Present Illness  67 yo female presents to ED on 2/11 found on floor and unresponsive. Initially presented to [facility], transferred to [facility]. Workup for acute respiratory failure with hypercarbia, COPD exacerbation. ETT 2/11-12. PMH of COPD, Asthma, CAD, CVA, lung cancer s/p lobectomy, aortic stenosis s/p TAVR.  Clinical Impression   Pt presents with generalized weakness, altered mental status with poor safety awareness, impaired balance, and decreased activity tolerance vs baseline. Pt to benefit from acute PT to address deficits. Pt ambulated short room distance only, stating she is too fatigued to continue. SpO2 88% and greater on 3LO2 during mobility. At this point, pt would benefit from ST-SNF level of care given high PLOF.  PT to progress mobility as tolerated, and will continue to follow acutely.         Recommendations for follow up therapy are one component of a multi-disciplinary discharge planning process, led by the attending physician.  Recommendations may be updated based on patient status, additional functional criteria and insurance authorization.  Follow Up Recommendations Skilled nursing-short term rehab (<3 hours/day) (hopeful pt can progress to [facility] level) Can patient physically be transported by private vehicle: Yes    Assistance Recommended at Discharge Frequent or constant Supervision/Assistance  Patient can return home with the following  A little help with walking and/or transfers;A little help with bathing/dressing/bathroom;Assistance with cooking/housework;Direct supervision/assist for medications management;Direct supervision/assist for financial management;Assist for transportation;Help with stairs or ramp for entrance    Equipment Recommendations None recommended by PT  Recommendations for Other Services       Functional Status Assessment  Patient has had a recent decline in their functional status and demonstrates the ability to make significant improvements in function in a reasonable and predictable amount of time.     Precautions / Restrictions Precautions Precautions: Fall Precaution Comments: 3L O2 baseline Restrictions Weight Bearing Restrictions: No      Mobility  Bed Mobility               General bed mobility comments: OOB in chair upon arrival, returned to chair    Transfers Overall transfer level: Needs assistance Equipment used: Rolling walker (2 wheels) Transfers: Sit to/from Stand Sit to Stand: Min assist           General transfer comment: rise and steady assist, STS x2 from recliner    Ambulation/Gait Ambulation/Gait assistance: Min assist Gait Distance (Feet): 20 Feet Assistive device: None Gait Pattern/deviations: Step-through pattern, Decreased stride length, Trunk flexed Gait velocity: decr     General Gait Details: cues for room navigation, assist to steady and help pt avoid obstacles. SpO2 88% and greater on 3LO2  Stairs            Wheelchair Mobility    Modified Rankin (Stroke Patients Only)       Balance Overall balance assessment: Needs assistance Sitting-balance support: Feet supported Sitting balance-Leahy Scale: Fair     Standing balance support: Single extremity supported, During functional activity Standing balance-Leahy Scale: Fair                               Pertinent Vitals/Pain Pain Assessment Pain Assessment: Faces Faces Pain Scale: Hurts little more Pain Location: generalized Pain Descriptors / Indicators: Grimacing Pain Intervention(s): Monitored during session, Limited activity within patient's tolerance, Repositioned    Home Living Family/patient expects  to be discharged to:: Private residence Living Arrangements: Spouse/significant other Available Help at Discharge: Family;Available 24 hours/day Type of Home:  House Home Access: Stairs to enter   [employer] of Steps: 1   Home Layout: One level Home Equipment: Conservation officer, nature (2 wheels);BSC/3in1;Cane - single point Additional Comments: home set up confirmed with husband over the phone    Prior Function Prior Level of Function : Independent/Modified Independent;Needs assist;Driving             Mobility Comments: No AD, indep 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, drives, manages IADLs     Hand Dominance   Dominant Hand: Right    Extremity/Trunk Assessment   Upper Extremity Assessment Upper Extremity Assessment: Defer to OT evaluation    Lower Extremity Assessment Lower Extremity Assessment: Generalized weakness    Cervical / Trunk Assessment Cervical / Trunk Assessment: Kyphotic  Communication   Communication: HOH  Cognition Arousal/Alertness: Awake/alert Behavior During Therapy: Flat affect Overall Cognitive Status: Impaired/Different from baseline Area of Impairment: Orientation, Attention, Memory, Following commands, Safety/judgement, Awareness, Problem solving                 Orientation Level: Disoriented to, Time, Situation Current Attention Level: Sustained Memory: Decreased recall of precautions, Decreased short-term memory Following Commands: Follows one step commands with increased time Safety/Judgement: Decreased awareness of safety, Decreased awareness of deficits Awareness: Emergent Problem Solving: Slow processing, Decreased initiation, Difficulty sequencing, Requires verbal cues General Comments: pt perseverative on previous PT questions during session, for instance pt answers "yes I wear it, 3L at home" to multiple different questions. Pt very HOH, which complicates cognition assessment. Lacks attention during gait, bumping into objects without care or awareness        General Comments General comments (skin integrity, edema, etc.): 3LO2, SPO2 88% and greater    Exercises      Assessment/Plan    PT Assessment Patient needs continued PT services  PT Problem List Decreased strength;Decreased mobility;Decreased activity tolerance;Decreased balance;Decreased knowledge of use of DME;Pain;Decreased safety awareness       PT Treatment Interventions DME instruction;Therapeutic activities;Therapeutic exercise;Gait training;Patient/family education;Balance training;Stair training;Neuromuscular re-education;Functional mobility training    PT Goals (Current goals can be found in the Care Plan section)  Acute Rehab PT Goals PT Goal Formulation: With patient Time For Goal Achievement: 04/14/22 Potential to Achieve Goals: Good    Frequency Min 3X/week     Co-evaluation               AM-PAC PT "6 Clicks" Mobility  Outcome Measure Help needed turning from your back to your side while in a flat bed without using bedrails?: A Little Help needed moving from lying on your back to sitting on the side of a flat bed without using bedrails?: A Little Help needed moving to and from a bed to a chair (including a wheelchair)?: A Little Help needed standing up from a chair using your arms (e.g., wheelchair or bedside chair)?: A Little Help needed to walk in hospital room?: A Little Help needed climbing 3-5 steps with a railing? : A Lot 6 Click Score: 17    End of Session Equipment Utilized During Treatment: Oxygen Activity Tolerance: Patient limited by fatigue Patient left: in chair;with call bell/phone within reach;with chair alarm set Nurse Communication: Mobility status PT Visit Diagnosis: Other abnormalities of gait and mobility (R26.89);Muscle weakness (generalized) (M62.81)    Time: 3818-2993 PT Time Calculation (min) (ACUTE ONLY): 11 min   Charges:   PT  Evaluation $PT Eval Low Complexity: 1 Low         Hafsah Hendler S, PT DPT Acute Rehabilitation Services Pager [phone]  Office [phone]   Louis Matte 03/31/2022, 2:55 PM

## 2022-03-31 NOTE — Progress Notes (Signed)
Modified Barium Swallow Study  Patient Details  Name: Emily Rocha MRN: [id number] Date of Birth: 15-Oct-1955  Today's Date: 03/31/2022  Modified Barium Swallow completed.  Full report located under Chart Review in the Imaging Section.  History of Present Illness 67 yo F  admitted to [facility] ED 2/11 with respiratory failure.  Pt intubated 2/11 and tx'd to [facility].  Extubated 2/12. CXR showing multifocal opacities concerning for multifocal pneumonia.  PMH of COPD, Asthma, CAD, GERD. CVA, lung cancer, aortic stenosis s/p TAVR. Followed by SLP during Nov 2019 admission, requiring multiple MBS studies due to impaired swallowing, which improved prior to D/C.   Clinical Impression Patient presents with a mild pharyngeal phase dysphagia as per this MBS. Swallow was initiated at level of pyriform sinus for thin and nectar thick liquids and vallecular sinus for puree, mechanical soft solids and honey thick liquids. Trace amount of thin liquid and nectar thick liquid barium penetrated in the laryngeal vestibule during the swallow but observed to exit fully. Penetration was above the level of the vocal cords. Only a trace amount of vallecular sinus residuals remained post initial swallow with nectar thick and thin liquids. Patient was unable to transit 48mm barium tablet from anterior to posterior portion of oral cavity. Straw used as a compesatory strategy and no significant difference in swallow function observed with straw as compared to consecutive cup sips. SLP recommending Dys 3 (mechanical soft) solids and thin liquids. Factors that may increase risk of adverse event in presence of aspiration Phineas Douglas & Padilla 2021):    Swallow Evaluation Recommendations Recommendations: PO diet PO Diet Recommendation: Dysphagia 3 (Mechanical soft);Thin liquids (Level 0) Liquid Administration via: Cup;Straw Medication Administration: Whole meds with puree Supervision: Patient able to self-feed;Intermittent  supervision/cueing for swallowing strategies Swallowing strategies  : Slow rate;Small bites/sips Postural changes: Position pt fully upright for meals;Stay upright 30-60 min after meals Oral care recommendations: Oral care BID (2x/day)     Sonia Baller, MA, CCC-SLP Speech Therapy

## 2022-03-31 NOTE — Discharge Instructions (Signed)

## 2022-03-31 NOTE — Evaluation (Signed)
Occupational Therapy Evaluation Patient Details Name: Emily Rocha MRN: [id number] DOB: [birth year] Today's Date: 03/31/2022   History of Present Illness 67 yo female presents to ED on 2/11 found on floor and unresponsive. Initially presented to [facility], transferred to [facility]. Workup for acute respiratory failure with hypercarbia, COPD exacerbation. ETT 2/11-12. PMH of COPD, Asthma, CAD, CVA, lung cancer s/p lobectomy, aortic stenosis s/p TAVR.   Clinical Impression   Emily Rocha was evaluated s/p the above admission list. Per report pt is indep at baseline including working, driving, and managing IADLs, no DME needed. PLOF and home set up confirmed with husband over the phone. Upon evaluation she was limited by impaired cognition, poor activity tolerance, generalized weakness, and unsteady gait. Overall she required min A for simple transfers with RW and maximal cues for initiation, sequencing and safely navigating the environment. VSS on 3L throughout. Due to the deficits listed below, she also requires min A for UB ADLs and up to max A for LB ADLs. Pt will benefit from continued acute OT services. Recommend d/c to SNF for continued therapy. If pt's family could provide 24/7 direct hands-on assist, pt would be appropriate for home with [facility] - however, pt's husband stated he works full time and she would be home alone 6-8 hours per day.    Recommendations for follow up therapy are one component of a multi-disciplinary discharge planning process, led by the attending physician.  Recommendations may be updated based on patient status, additional functional criteria and insurance authorization.   Follow Up Recommendations  Skilled nursing-short term rehab (<3 hours/day)     Assistance Recommended at Discharge Frequent or constant Supervision/Assistance  Patient can return home with the following A little help with walking and/or transfers;A lot of help with bathing/dressing/bathroom;Assistance with  cooking/housework;Assistance with feeding;Direct supervision/assist for medications management;Direct supervision/assist for financial management;Assist for transportation;Help with stairs or ramp for entrance    Functional Status Assessment  Patient has had a recent decline in their functional status and demonstrates the ability to make significant improvements in function in a reasonable and predictable amount of time.  Equipment Recommendations  None recommended by OT (defer)       Precautions / Restrictions Precautions Precautions: Fall Precaution Comments: 3L O2 baseline Restrictions Weight Bearing Restrictions: No      Mobility Bed Mobility Overal bed mobility: Needs Assistance             General bed mobility comments: OOB in chair upon arrival, returned to chair    Transfers Overall transfer level: Needs assistance Equipment used: Rolling walker (2 wheels) Transfers: Sit to/from Stand Sit to Stand: Min assist           General transfer comment: assist for cues and balance      Balance Overall balance assessment: Needs assistance Sitting-balance support: Feet supported Sitting balance-Leahy Scale: Fair Sitting balance - Comments: poor tolerance for unsupported sitting   Standing balance support: Single extremity supported, During functional activity Standing balance-Leahy Scale: Fair Standing balance comment: able to groom at the sink with 1 UE supported externally                           ADL either performed or assessed with clinical judgement   ADL Overall ADL's : Needs assistance/impaired Eating/Feeding: Minimal assistance;Sitting   Grooming: Moderate assistance;Standing Grooming Details (indicate cue type and reason): cues for locating items, initiation, sequencing and balance Upper Body Bathing: Minimal assistance;Sitting   Lower Body  Bathing: Moderate assistance;Sit to/from stand   Upper Body Dressing : Minimal  assistance;Sitting   Lower Body Dressing: Maximal assistance;Sit to/from stand   Toilet Transfer: Minimal assistance;Ambulation;Rolling walker (2 wheels)   Toileting- Clothing Manipulation and Hygiene: Min guard;Sitting/lateral lean       Functional mobility during ADLs: Minimal assistance;Rolling walker (2 wheels);Cueing for sequencing;Cueing for safety General ADL Comments: requires maximal cues throughout, poor activity tolernace, VSS on 3L     Vision Baseline Vision/History: 1 Wears glasses Vision Assessment?: Vision impaired- to be further tested in functional context Additional Comments: seemingly impaired, difficult to assess due to [facility] and cognition. Pt wears glasses, not present acutely. Poor visual attention and scanning     Perception Perception Perception Tested?: No   Praxis Praxis Praxis tested?: Not tested    Pertinent Vitals/Pain Pain Assessment Pain Assessment: Faces Faces Pain Scale: Hurts a little bit Pain Location: "my butt" Pain Descriptors / Indicators: Grimacing Pain Intervention(s): Limited activity within patient's tolerance, Monitored during session     Hand Dominance Right   Extremity/Trunk Assessment Upper Extremity Assessment Upper Extremity Assessment: Generalized weakness   Lower Extremity Assessment Lower Extremity Assessment: Defer to PT evaluation   Cervical / Trunk Assessment Cervical / Trunk Assessment: Kyphotic (mild)   Communication Communication Communication: HOH   Cognition Arousal/Alertness: Awake/alert Behavior During Therapy: Flat affect Overall Cognitive Status: Impaired/Different from baseline Area of Impairment: Orientation, Attention, Memory, Following commands, Safety/judgement, Awareness, Problem solving                 Orientation Level: Disoriented to, Time, Situation Current Attention Level: Sustained Memory: Decreased recall of precautions, Decreased short-term memory Following Commands: Follows one  step commands with increased time Safety/Judgement: Decreased awareness of safety, Decreased awareness of deficits Awareness: Emergent Problem Solving: Slow processing, Decreased initiation, Difficulty sequencing, Requires verbal cues General Comments: Oriented to self and place, disordiented to time and situation, unable to recall after being re-oriented to 2024. Poor attention throughout, requires constant cues for initiation, sequencing and sustaining a task. Unaware of environmental obstacles, problem solving and safety - confirmed baseline cog with husband, pt normally indep and "sharp"     General Comments  VSS on 3L, pt on RA for ~2 minutes but SpO2 dropped to 87%, recovered to 92% with 3L. Pt reported dizziness with mobility, BP stable.            Home Living Family/patient expects to be discharged to:: Private residence Living Arrangements: Spouse/significant other Available Help at Discharge: Family;Available 24 hours/day Type of Home: House Home Access: Stairs to enter [employer] of Steps: 1   Home Layout: One level     Bathroom Shower/Tub: Occupational psychologist: Standard Bathroom Accessibility: Yes   Home Equipment: Conservation officer, nature (2 wheels);BSC/3in1;Cane - single point   Additional Comments: home set up confirmed with husband over the phone      Prior Functioning/Environment Prior Level of Function : Independent/Modified Independent;Needs assist;Driving             Mobility Comments: No AD, indep 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, drives, manages IADLs        OT Problem List: Decreased strength;Decreased range of motion;Decreased activity tolerance;Impaired balance (sitting and/or standing);Decreased cognition;Decreased safety awareness;Decreased knowledge of use of DME or AE;Decreased knowledge of precautions;Cardiopulmonary status limiting activity      OT Treatment/Interventions: Self-care/ADL training;Therapeutic  exercise;DME and/or AE instruction;Energy conservation;Therapeutic activities;Patient/family education;Balance training    OT Goals(Current goals can be found in the  care plan section) Acute Rehab OT Goals Patient Stated Goal: to feel better OT Goal Formulation: With patient Time For Goal Achievement: 04/14/22 Potential to Achieve Goals: Good ADL Goals Pt Will Perform Grooming: with supervision;standing Pt Will Perform Upper Body Dressing: with set-up;sitting Pt Will Perform Lower Body Dressing: with min assist;sit to/from stand Pt Will Transfer to Toilet: with min guard assist;ambulating Additional ADL Goal #1: Pt will safely navigate environment with RW and superivison A without running into any obstacles  OT Frequency: Min 2X/week       AM-PAC OT "6 Clicks" Daily Activity     Outcome Measure Help from another person eating meals?: A Little   Help from another person toileting, which includes using toliet, bedpan, or urinal?: A Little Help from another person bathing (including washing, rinsing, drying)?: A Lot Help from another person to put on and taking off regular upper body clothing?: A Little Help from another person to put on and taking off regular lower body clothing?: A Lot 6 Click Score: 13   End of Session Equipment Utilized During Treatment: Rolling walker (2 wheels);Oxygen (3L) Nurse Communication: Mobility status  Activity Tolerance: Patient tolerated treatment well Patient left: in chair;with call bell/phone within reach;with chair alarm set  OT Visit Diagnosis: Unsteadiness on feet (R26.81);Other abnormalities of gait and mobility (R26.89);Muscle weakness (generalized) (M62.81)                Time: 7829-5621 OT Time Calculation (min): 39 min Charges:  OT General Charges $OT Visit: 1 Visit OT Evaluation $OT Eval Moderate Complexity: 1 Mod  Shade Flood, OTR/L Acute Rehabilitation Services Office [phone] Secure Chat Communication  Preferred   Elliot Cousin 03/31/2022, 11:43 AM

## 2022-03-31 NOTE — Progress Notes (Addendum)
NAME:  Emily Rocha, MRN:  [id number], DOB:  1955-11-06, LOS: 3 ADMISSION DATE:  03/28/2022, CONSULTATION DATE:  2/11 REFERRING MD:  Dr. Gilford Raid, CHIEF COMPLAINT:  multifocal pneumonia   History of Present Illness:  Patient is a 67 yo F w/ pertinent PMH of COPD, Asthma, CAD, CVA, lung cancer, aortic stenosis s/p TAVR admitted to [facility] ED 2/11 with respiratory failure.   Patient has been sick since 2/7 and having to use supplemental O2 at home that she usually doesn't have to use. On 2/10 patient minimally responsive and confused. 2/11 worsening mental status. EMS called and transferred patient to [facility] ED 2/11. Vitals stable. Patient obtunded/minimally responsive. O2 sat low on NRB. Patient intubated. CXR showing multifocal opacities concerning for multifocal pneumonia. Tmax 102 F/wbc 20.7.  Started on Rocephin/azithromycin.  Cultures obtained. IV fluids given. COVID, flu, RSV negative. UA rare bacteria.  LA 0.8 then 2.0.  ABG 7.18, 54, 60, 19.5.  Post intubation hypotension requiring low dose levo. Patient being transported to [facility].  PCCM consulted.  Pertinent ED labs: K6.1, glucose 87, creatinine 0.83, mild elevated LFTs, troponin 1184 then 1126.  Pertinent  Medical History   Past Medical History:  Diagnosis Date   Anxiety    Asthma    COPD (chronic obstructive pulmonary disease) ([place])    Coronary artery disease    a. s/p NSTEMI in 11/2016 and required CABG with LIMA-LAD, SVG-LCx and SVG-D1 and complicated by cardiogenic shock b. cath in 05/2020 showing severe RCA stenosis and treated with orbital atherectomy and stent placement   GERD (gastroesophageal reflux disease)    History of CVA (cerebrovascular accident)    History of lung cancer 2020   Peripheral vascular disease ([place])    S/P TAVR (transcatheter aortic valve replacement) 08/12/2020   s/p TAVR with a 23 mm Edwards S3U via the TF approach by Dr. Angelena Form & Dr. Cyndia Bent   Severe aortic stenosis    - 2/13> per husband, still smokes,  has not used her home O2 in months.  Still works as of last week, manages a tax firm   Significant Hospital Events: Including procedures, antibiotic start and stop dates in addition to other pertinent events   2/11 admitted to [facility] ED, multifocal pneumonia, intubated, AKI, hyperkalemia; Transferring to [facility] PCCM consulted 2/12 off pressors, extubated   Interim History / Subjective:  Doing well this am, did not want to wear bipap. In afib which may explain her prior stroke.  Objective   Blood pressure 136/65, pulse (!) 110, temperature 97.6 F (36.4 C), temperature source Axillary, resp. rate (!) 38, height 5\' 4"  (1.626 m), weight 66.2 kg, SpO2 95 %.    Vent Mode: BIPAP FiO2 (%):  [40 %] 40 % Set Rate:  [8 bmp] 8 bmp PEEP:  [5 cmH20] 5 cmH20   Intake/Output Summary (Last 24 hours) at 03/31/2022 0109 Last data filed at 03/31/2022 0600 Gross per 24 hour  Intake 1198.79 ml  Output 1925 ml  Net -726.21 ml    Filed Weights   03/29/22 0500 03/30/22 0329 03/31/22 0129  Weight: 67 kg 66.4 kg 66.2 kg    Examination: No distress Wheezing/rhonci mildly improved Moves ext to command   Resolved Hospital Problem list   Hyperkalemia- resolved wit medical treatment Hypoglycemia   Assessment & Plan:  Acute on chronic hypoxic respiratory failure w/ hypercarbia Coronavirus ARDS- not COVID; Pct mildly up so will treat for concurrent CAP; GPCs in sputum,  COPD exacerbation Tobacco abuse> ongoing per husband, pt  will deny  Hx Lung cancer post lobectomy Plan - Solumedrol to prednisone x 7 more days - LABA/LAMA/ICS nebs, should be on EITHER stiolto OR trelegy on DC not both   Hx anxiety- on  Metabolic encephalopathy/ICU delirium- ongoing issue Hyperammonemia- unclear cause, could be related to ALI from initial sepsis.  Liver US okay a couple months ago.  Has had chronically mildly elevated ammonia.  She was also found down, could be some element of anoxic injury but if present its  mild. Hx of CVA - xanax PRN - delirium precautions  - I guess increase lactulose for now as has not had any BM yet  Type II NSTEMI- echo okay just volume overlaoded Afib/RVR- seen on tele overnight, high CHADS2vasc, lytes okay CAD HTN Aortic stenosis s/p TAVR - tele monitoring, in sinus at present - ASA  - home metoprolol increased - start eliquis - resume home torsemide at 40mg /day, this should help with hyperkalemia  GERD Small hiatal hernia - PPI - aspiration precautions   Best practice:  Diet: try to advance diet to cardiac today, SLP following Pain/Anxiety/Delirium protocol (if indicated): d/c VAP protocol (if indicated): PPI  DVT prophylaxis: lovenox GI prophylaxis: PPI (PTA) Glucose control: SSI Mobility: PT/OT up to chair Code Status: full Family Communication: Husband updated at bedside 2/13.  Stable for transfer to progressive appreciate [facility] taking over care starting 2/15 am.  Remaining issues: (1) PT/OT: suspect will need IP rehab (2) Stability of HR when goes into Afib (3) Continued breathing improvement (home 3L, currently 4L) (4) Narrowing abx plan pending tracheal aspirate cultures (5) Bowel function improvement, continued mental status improvement  Erskine Emery MD PCCM

## 2022-03-31 NOTE — Progress Notes (Signed)
Speech Language Pathology Treatment: Dysphagia  Patient Details Name: Emily Rocha MRN: [id number] DOB: 1955/10/15 Today's Date: 03/31/2022 Time: 0905-0920 SLP Time Calculation (min) (ACUTE ONLY): 15 min  Assessment / Plan / Recommendation Clinical Impression  Patient seen by SLP for skilled treatment focused on dysphagia goals. When SLP arrived, patient sitting in recliner and drinking nectar thick juice via straw. She is very HOH and SLP had to speak very loudly in her right ear. SLP then provided thin liquids and nectar thick liquids for differential diagnosis. Patient drank both via straw sips. She exhibited multiple swallows with both consistencies but this seemed slightly worse with thin liquids. No coughing or throat clearing observed but patient with excessive belching (this appeared equal between the two consistencies). SpO2 remained in low 90% range and RR varied wildly from 20-45 but did not seem to have a good waveform. Patient did appear with shallow breathing but voice was strong and clear. Patient has h/o dysphagia, last seen here at [facility] in 2019. SLP recommending continue with nectar thick liquids and will plan for MBS today to r/o aspiration and determine safe PO diet.   HPI HPI: 67 yo F  admitted to [facility] ED 2/11 with respiratory failure.  Pt intubated 2/11 and tx'd to [facility].  Extubated 2/12. CXR showing multifocal opacities concerning for multifocal pneumonia.  PMH of COPD, Asthma, CAD, GERD. CVA, lung cancer, aortic stenosis s/p TAVR. Followed by SLP during Nov 2019 admission, requiring multiple MBS studies due to impaired swallowing, which improved prior to D/C.      SLP Plan  Continue with current plan of care;MBS      Recommendations for follow up therapy are one component of a multi-disciplinary discharge planning process, led by the attending physician.  Recommendations may be updated based on patient status, additional functional criteria and insurance authorization.     Recommendations  Diet recommendations: Nectar-thick liquid;Other(comment) (continue on MD prescribed diet but SLP will make further PO recs after MBS) Medication Administration: Whole meds with puree Supervision: Patient able to self feed;Intermittent supervision to cue for compensatory strategies Compensations: Slow rate;Small sips/bites Postural Changes and/or Swallow Maneuvers: Seated upright 90 degrees;Upright 30-60 min after meal                Oral Care Recommendations: Oral care BID Follow Up Recommendations: Other (comment) (TBD) Assistance recommended at discharge: PRN SLP Visit Diagnosis: Dysphagia, oropharyngeal phase (R13.12) Plan: Continue with current plan of care;MBS           Sonia Baller, MA, CCC-SLP Speech Therapy

## 2022-03-31 NOTE — TOC Benefit Eligibility Note (Signed)
Patient Teacher, English as a foreign language completed.    The patient is currently admitted and upon discharge could be taking Eliquis.  The current 30 day co-pay is $45.00.   The patient is insured through [employer]

## 2022-03-31 NOTE — Telephone Encounter (Signed)
Pharmacy Patient Advocate Encounter  Insurance verification completed.    The patient is insured through [facility]   The patient is currently admitted and ran test claims for the following: Eliquis.  Copays and coinsurance results were relayed to Inpatient clinical team.

## 2022-04-01 ENCOUNTER — Inpatient Hospital Stay (HOSPITAL_COMMUNITY): Payer: Medicare HMO

## 2022-04-01 DIAGNOSIS — J189 Pneumonia, unspecified organism: Secondary | ICD-10-CM | POA: Diagnosis not present

## 2022-04-01 DIAGNOSIS — J9602 Acute respiratory failure with hypercapnia: Secondary | ICD-10-CM | POA: Diagnosis not present

## 2022-04-01 DIAGNOSIS — A419 Sepsis, unspecified organism: Secondary | ICD-10-CM | POA: Diagnosis not present

## 2022-04-01 DIAGNOSIS — R7989 Other specified abnormal findings of blood chemistry: Secondary | ICD-10-CM | POA: Diagnosis not present

## 2022-04-01 LAB — BLOOD GAS, ARTERIAL
Acid-Base Excess: 21.8 mmol/L — ABNORMAL HIGH (ref 0.0–2.0)
Bicarbonate: 52.6 mmol/L — ABNORMAL HIGH (ref 20.0–28.0)
Drawn by: 55062
O2 Saturation: 99.5 %
Patient temperature: 37.1
pCO2 arterial: 91 mmHg (ref 32–48)
pH, Arterial: 7.37 (ref 7.35–7.45)
pO2, Arterial: 128 mmHg — ABNORMAL HIGH (ref 83–108)

## 2022-04-01 LAB — BASIC METABOLIC PANEL
Anion gap: 10 (ref 5–15)
BUN: 33 mg/dL — ABNORMAL HIGH (ref 8–23)
CO2: 36 mmol/L — ABNORMAL HIGH (ref 22–32)
Calcium: 8.9 mg/dL (ref 8.9–10.3)
Chloride: 90 mmol/L — ABNORMAL LOW (ref 98–111)
Creatinine, Ser: 0.54 mg/dL (ref 0.44–1.00)
GFR, Estimated: >60 mL/min (ref >60)
Glucose, Bld: 102 mg/dL — ABNORMAL HIGH (ref 70–99)
Potassium: 4.3 mmol/L (ref 3.5–5.1)
Sodium: 136 mmol/L (ref 135–145)

## 2022-04-01 LAB — AMMONIA: Ammonia: 46 umol/L — ABNORMAL HIGH (ref 9–35)

## 2022-04-01 LAB — GLUCOSE, CAPILLARY
Glucose-Capillary: 100 mg/dL — ABNORMAL HIGH (ref 70–99)
Glucose-Capillary: 168 mg/dL — ABNORMAL HIGH (ref 70–99)
Glucose-Capillary: 107 mg/dL — ABNORMAL HIGH (ref 70–99)
Glucose-Capillary: 131 mg/dL — ABNORMAL HIGH (ref 70–99)
Glucose-Capillary: 124 mg/dL — ABNORMAL HIGH (ref 70–99)

## 2022-04-01 LAB — MAGNESIUM: Magnesium: 2.2 mg/dL (ref 1.7–2.4)

## 2022-04-01 LAB — PHOSPHORUS: Phosphorus: 2.5 mg/dL (ref 2.5–4.6)

## 2022-04-01 MED ORDER — METHYLPREDNISOLONE SODIUM SUCC 125 MG IJ SOLR: 60.0000 mg | Freq: Every day | INTRAMUSCULAR | Status: DC

## 2022-04-01 MED ORDER — METHYLPREDNISOLONE SODIUM SUCC 125 MG IJ SOLR
120.0000 mg | Freq: Every day | INTRAMUSCULAR | Status: DC
  Administered 2022-04-01 – 2022-04-03 (×3): 120 mg via INTRAVENOUS
  Filled 2022-04-01 (×4): qty 2

## 2022-04-01 MED ORDER — SODIUM CHLORIDE 0.9 % IV SOLN
2.0000 g | INTRAVENOUS | Status: AC
  Administered 2022-04-02 – 2022-04-03 (×2): 2 g via INTRAVENOUS
  Filled 2022-04-01 (×2): qty 20

## 2022-04-01 MED ORDER — METHYLPREDNISOLONE SODIUM SUCC 125 MG IJ SOLR: 125.0000 mg | Freq: Once | INTRAMUSCULAR | Status: DC

## 2022-04-01 MED ORDER — ONDANSETRON HCL 4 MG/2ML IJ SOLN
4.0000 mg | Freq: Four times a day (QID) | INTRAMUSCULAR | Status: DC | PRN
  Administered 2022-04-01 (×2): 4 mg via INTRAVENOUS
  Filled 2022-04-01 (×2): qty 2

## 2022-04-01 NOTE — Progress Notes (Signed)
Pt transferred to 5W06 via wheelchair by RN and NT. Report called by day shift RN prior to transporting pt.   Pt transferred with 1 pink denture cup containing pts dentures. Pt had no other personal belongings.  Attempted to call pts husband, Darica Goren, however no answer. Message left stating pts wife had been transferred to 5W06.

## 2022-04-01 NOTE — Progress Notes (Signed)
Overnight event  Notified by RN that patient arrived to unit 5W from the ICU tonight and noted to be in respiratory distress.  Chart reviewed.  In brief, 67 year old female with history of COPD on home oxygen, severe aortic stenosis status post TAVR, CAD status post CABG, chronic diastolic heart failure, history of CVA, history of lung cancer admitted to the ICU on 2/11 for septic shock secondary to multifocal pneumonia with ARDS, acute on chronic combined hypoxic and hypercarbic respiratory failure secondary to COPD exacerbation and multifocal pneumonia requiring intubation.  COVID/influenza/RSV PCR negative.  Respiratory viral panel positive for coronavirus OC43.  Blood culture 1/2 growing Staph hominis (contamination) tracheal aspirate positive for Streptococcus pneumonia.  Extubated on 2/12.  Hospital course complicated by A-fib with RVR on 2/13.  Patient is on metoprolol and Eliquis.  Echo done 2/13 showing EF 55 to 37%, RV systolic function moderately reduced, normal structure/functioning of aortic valve prosthesis. Transferred to [facility] on 2/15.  Patient is currently on antibiotics, steroid taper, and bronchodilators.  Patient immediately seen and examined at bedside.  Complaining of chronic shortness of breath.  Denies chest pain.  Patient noted to be tachypneic to the 30s.  Satting 97% on 4 L.  Heart rate in the 70s, sinus rhythm.  Blood pressure 116/48.  Coarse breath sounds and wheezing appreciated on auscultation of the lungs.  No JVD or peripheral edema on exam.  -Chest x-ray done and showing persistent but improved diffuse interstitial pulmonary infiltrates.  Showing narrowing of the subglottic airway which may relate to subglottic stenosis or edema.  Patient is wheezing on exam, no stridor.  Discussed with Dr. Ruthann Cancer, may need ENT evaluation.  PCCM will consult. -Albuterol neb treatment and continue scheduled inhalers -BiPAP as needed -Solu-Medrol 60 mg every 12 hours -Stat EKG  pending -Stat ABG pending

## 2022-04-01 NOTE — Plan of Care (Signed)

## 2022-04-01 NOTE — Progress Notes (Signed)
Speech Language Pathology Treatment: Dysphagia  Patient Details Name: Emily Rocha MRN: [id number] DOB: February 11, 1956 Today's Date: 04/01/2022 Time: 7628-3151 SLP Time Calculation (min) (ACUTE ONLY): 15 min  Assessment / Plan / Recommendation Clinical Impression  Patient seen by SLP for skilled treatment focused on dysphagia goals. Patient was in bed, awake and alert. Lunch tray on table in front of her was untouched. Patient reported she has been having nausea today. She was agreeable to drinking some ginger ale. No overt s/s aspiration or penetration with straw sips of soda (thin consistency). RN arrived in room and SLP briefly discussed her ongoing overt s/s of GERD with frequent belching which occurs soon after liquid or solid PO intake. SLP will plan to f/u at least one more time to ensure diet toleration.    HPI HPI: 67 yo F  admitted to [facility] ED 2/11 with respiratory failure.  Pt intubated 2/11 and tx'd to [facility].  Extubated 2/12. CXR showing multifocal opacities concerning for multifocal pneumonia.  PMH of COPD, Asthma, CAD, GERD. CVA, lung cancer, aortic stenosis s/p TAVR. Followed by SLP during Nov 2019 admission, requiring multiple MBS studies due to impaired swallowing, which improved prior to D/C.      SLP Plan  Continue with current plan of care      Recommendations for follow up therapy are one component of a multi-disciplinary discharge planning process, led by the attending physician.  Recommendations may be updated based on patient status, additional functional criteria and insurance authorization.    Recommendations  Diet recommendations: Dysphagia 3 (mechanical soft);Thin liquid Liquids provided via: Cup;Straw Medication Administration: Whole meds with puree Supervision: Patient able to self feed;Intermittent supervision to cue for compensatory strategies Compensations: Slow rate;Small sips/bites Postural Changes and/or Swallow Maneuvers: Seated upright 90 degrees;Upright  30-60 min after meal                Oral Care Recommendations: Oral care BID Assistance recommended at discharge: PRN SLP Visit Diagnosis: Dysphagia, oropharyngeal phase (R13.12) Plan: Continue with current plan of care           Sonia Baller, MA, CCC-SLP Speech Therapy

## 2022-04-01 NOTE — Progress Notes (Signed)
PROGRESS NOTE        PATIENT DETAILS Name: Emily Rocha Age: 67 y.o. Sex: female Date of Birth: 07/09/1955 Admit Date: 03/28/2022 Admitting Physician Collier Bullock, MD SEG:BTDV, Costella Hatcher, MD  Brief Summary: Patient is a 67 y.o.  female w hx of COPD on Home O2, severe AS s/p TAVR, CAD-s/p CABG admitted via [facility] to [facility] ICU for acute on chronic combined hypoxic and hypercarbic respiratory failure-requiring intubation.  Stabilized in the ICU-hospital course complicated by development of A-fib with RVR.  Transferred to [facility] on 2/15.  Significant events: 2/11>> admit to ICU by PCCM 2/12>> extubated 2/13>> episode of A-fib RVR 2/15>> transferred to [facility]  Significant studies: 2/11>> CT chest/abdomen/pelvis: Multifocal pneumonia 2/12>> CT head: No acute intracranial abnormality 2/13>> echo: EF 76-16%, RV systolic function moderately reduced.  Normal structure/functioning of aortic valve prosthesis  Significant microbiology data: 2/11>> COVID/influenza/RSV PCR: Negative 2/11>> respiratory virus panel: +ve Coronavirus OC43 2/11>> blood culture: 1/2 Staph hominis (contamination) 2/11>> tracheal aspirate: Streptococcus pneumoniae  Procedures: 2/11-2/12>> ETT  Consults: PCCM.  Subjective: Lying comfortably in bed-denies any chest pain or shortness of breath.  Objective: Vitals: Blood pressure 138/64, pulse 94, temperature 97.6 F (36.4 C), resp. rate (!) 37, height 5\' 4"  (1.626 m), weight 66.2 kg, SpO2 94 %.   Exam: Gen Exam:Alert awake-not in any distress HEENT:atraumatic, normocephalic Chest: Some scattered rhonchi-bibasilar rales. CVS:S1S2 regular Abdomen:soft non tender, non distended Extremities:no edema Neurology: Non focal Skin: no rash  Pertinent Labs/Radiology:    Latest Ref Rng & Units 03/31/2022   12:27 AM 03/30/2022    2:04 AM 03/29/2022    5:42 AM  CBC  WBC 4.0 - 10.5 K/uL 11.3  11.7  10.6   Hemoglobin 12.0 - 15.0 g/dL 11.8  11.9   11.2   Hematocrit 36.0 - 46.0 % 40.4  39.8  37.6   Platelets 150 - 400 K/uL 207  187  152     Lab Results  Component Value Date   NA 136 04/01/2022   K 4.3 04/01/2022   CL 90 (L) 04/01/2022   CO2 36 (H) 04/01/2022      Assessment/Plan: Septic shock secondary to multifocal PNA with ARDS (coronavirus and streptococcal pneumonia on culture data) Sepsis physiology has resolved Culture data as above Continue Rocephin x 7 days total  Acute on chronic hypoxic and hypercarbic respiratory failure Due to PNA/COPD exacerbation Intubated on 2/11-extubated on 2/12 Continue antibiotics, steroid taper and bronchodilators  Acute metabolic encephalopathy Due to hypercarbia Resolved-Completely awake/alert this morning  COPD exacerbation Improved Tapering steroids Continue bronchodilators  A-fib RVR Occurred on 2/30 Back to sinus rhythm Echo as above Continue metoprolol Continue Eliquis  Elevated troponins likely due to demand ischemia No chest pain Echo with stable EF  CAD s/p CABG 2018 and PCI April 2022 Elevated troponins on admission likely due to demand ischemia Continue statin/beta-blocker-no longer on aspirin as on Eliquis Will require cardiology follow-up at some point   History of severe aortic stenosis-s/p TAVR 2022 Echo shows normal structure/function of prosthetic valve  Chronic diastolic heart failure Volume status relatively stable Continue Demadex  Mildly elevated ammonia levels Unclear etiology No history of cirrhosis Continue lactulose for a few more days  History of CVA Moving all 4 extremities Now on Eliquis  GERD PPI  History of T1 cN0 M0 adenocarcinoma the right upper lobe  s/p R VATS, RULectomy on 10/26/2018   Tobacco abuse Patient denies but per prior notes-ongoing use per husband Transdermal nicotine  Debility/deconditioning PT/OT eval ongoing-SNF recommended  BMI Estimated body mass index is 25.05 kg/m as calculated from the  following:   Height as of this encounter: 5\' 4"  (1.626 m).   Weight as of this encounter: 66.2 kg.   Code status:   Code Status: Full Code   DVT Prophylaxis: SCDs Start: 03/28/22 2128 apixaban (ELIQUIS) tablet 5 mg    Family Communication: None at bedside   Disposition Plan: Status is: Inpatient Remains inpatient appropriate because: Severity of illness   Planned Discharge Destination:Skilled nursing facility   Diet: Diet Order             Diet Heart Room service appropriate? Yes; Fluid consistency: Thin  Diet effective now                     Antimicrobial agents: Anti-infectives (From admission, onward)    Start     Dose/Rate Route Frequency Ordered Stop   03/30/22 1400  vancomycin (VANCOREADY) IVPB 750 mg/150 mL  Status:  Discontinued        750 mg 150 mL/hr over 60 Minutes Intravenous Every 12 hours 03/30/22 1014 03/31/22 1518   03/30/22 1000  cefTRIAXone (ROCEPHIN) 2 g in sodium chloride 0.9 % 100 mL IVPB        2 g 200 mL/hr over 30 Minutes Intravenous Every 24 hours 03/30/22 0837     03/30/22 1000  azithromycin (ZITHROMAX) tablet 500 mg  Status:  Discontinued        500 mg Oral Daily 03/30/22 0842 03/31/22 0823   03/30/22 0200  vancomycin (VANCOREADY) IVPB 500 mg/100 mL  Status:  Discontinued        500 mg 100 mL/hr over 60 Minutes Intravenous Every 12 hours 03/29/22 1454 03/30/22 1014   03/29/22 1415  vancomycin (VANCOREADY) IVPB 1500 mg/300 mL        1,500 mg 150 mL/hr over 120 Minutes Intravenous  Once 03/29/22 1315 03/29/22 1544   03/28/22 1600  cefTRIAXone (ROCEPHIN) 2 g in sodium chloride 0.9 % 100 mL IVPB  Status:  Discontinued        2 g 200 mL/hr over 30 Minutes Intravenous Every 24 hours 03/28/22 1549 03/30/22 0813   03/28/22 1600  azithromycin (ZITHROMAX) 500 mg in sodium chloride 0.9 % 250 mL IVPB  Status:  Discontinued        500 mg 250 mL/hr over 60 Minutes Intravenous Every 24 hours 03/28/22 1549 03/30/22 0842         MEDICATIONS: Scheduled Meds:  apixaban  5 mg Oral BID   arformoterol  15 mcg Nebulization BID   aspirin  81 mg Oral Daily   budesonide (PULMICORT) nebulizer solution  0.5 mg Nebulization BID   Chlorhexidine Gluconate Cloth  6 each Topical Q0600   Chlorhexidine Gluconate Cloth  6 each Topical Q0600   guaiFENesin  600 mg Oral BID   insulin aspart  0-15 Units Subcutaneous TID WC   lactulose  20 g Oral TID   metoprolol tartrate  37.5 mg Oral BID   mupirocin ointment  1 Application Nasal BID   nicotine  14 mg Transdermal Daily   pantoprazole  40 mg Oral Daily   polyethylene glycol  17 g Oral Daily   predniSONE  40 mg Oral Q breakfast   revefenacin  175 mcg Nebulization Daily   torsemide  40 mg  Oral Daily   Continuous Infusions:  sodium chloride 10 mL/hr at 04/01/22 0800   sodium chloride Stopped (03/30/22 1303)   cefTRIAXone (ROCEPHIN)  IV 2 g (04/01/22 0954)   PRN Meds:.acetaminophen, albuterol, ALPRAZolam, metoprolol tartrate, ondansetron (ZOFRAN) IV, mouth rinse, polyethylene glycol   I have personally reviewed following labs and imaging studies  LABORATORY DATA: CBC: Recent Labs  Lab 03/28/22 1549 03/28/22 1551 03/28/22 2249 03/29/22 0542 03/30/22 0204 03/31/22 0027  WBC 20.7*  --   --  10.6* 11.7* 11.3*  NEUTROABS 17.6*  --   --   --   --   --   HGB 13.2 15.3* 11.6* 11.2* 11.9* 11.8*  HCT 44.8 45.0 34.0* 37.6 39.8 40.4  MCV 90.5  --   --  87.9 89.4 91.0  PLT 199  --   --  152 187 810    Basic Metabolic Panel: Recent Labs  Lab 03/28/22 1946 03/28/22 2249 03/29/22 0542 03/30/22 0204 03/31/22 0027 04/01/22 0233  NA 134* 136 136 138 135 136  K 4.5 4.6 5.0 5.1 5.6* 4.3  CL 96*  --  96* 99 96* 90*  CO2 29  --  26 29 30  36*  GLUCOSE 93  --  166* 134* 110* 102*  BUN 31*  --  24* 35* 39* 33*  CREATININE 0.65  --  0.78 0.81 0.64 0.54  CALCIUM 8.0*  --  8.6* 8.7* 9.0 8.9  MG  --   --  3.3* 3.5*  --  2.2  PHOS  --   --   --   --  3.1 2.5     GFR: Estimated Creatinine Clearance: 63 mL/min (by C-G formula based on SCr of 0.54 mg/dL).  Liver Function Tests: Recent Labs  Lab 03/28/22 1549 03/29/22 0542 03/31/22 0027  AST 163* 78* 80*  ALT 166* 105* 101*  ALKPHOS 147* 117 145*  BILITOT 1.9* 1.1 0.6  PROT 7.3 5.6* 6.6  ALBUMIN 3.3* 2.3* 2.7*   No results for input(s): "LIPASE", "AMYLASE" in the last 168 hours. Recent Labs  Lab 03/28/22 2320 03/30/22 0204 03/31/22 0027 04/01/22 0233  AMMONIA 39* 47* 71* 46*    Coagulation Profile: Recent Labs  Lab 03/28/22 1549  INR 1.2    Cardiac Enzymes: No results for input(s): "CKTOTAL", "CKMB", "CKMBINDEX", "TROPONINI" in the last 168 hours.  BNP (last 3 results) No results for input(s): "PROBNP" in the last 8760 hours.  Lipid Profile: No results for input(s): "CHOL", "HDL", "LDLCALC", "TRIG", "CHOLHDL", "LDLDIRECT" in the last 72 hours.  Thyroid Function Tests: No results for input(s): "TSH", "T4TOTAL", "FREET4", "T3FREE", "THYROIDAB" in the last 72 hours.  Anemia Panel: No results for input(s): "VITAMINB12", "FOLATE", "FERRITIN", "TIBC", "IRON", "RETICCTPCT" in the last 72 hours.  Urine analysis:    Component Value Date/Time   COLORURINE YELLOW 03/28/2022 1622   APPEARANCEUR CLEAR 03/28/2022 1622   LABSPEC 1.014 03/28/2022 1622   PHURINE 6.0 03/28/2022 1622   GLUCOSEU NEGATIVE 03/28/2022 1622   HGBUR NEGATIVE 03/28/2022 1622   BILIRUBINUR NEGATIVE 03/28/2022 1622   KETONESUR 5 (A) 03/28/2022 1622   PROTEINUR 30 (A) 03/28/2022 1622   UROBILINOGEN 0.2 12/04/2014 1737   NITRITE NEGATIVE 03/28/2022 1622   LEUKOCYTESUR NEGATIVE 03/28/2022 1622    Sepsis Labs: Lactic Acid, Venous    Component Value Date/Time   LATICACIDVEN 1.0 03/29/2022 0255    MICROBIOLOGY: Recent Results (from the past 240 hour(s))  Blood Culture (routine x 2)     Status: Abnormal   Collection Time:  03/28/22  3:49 PM   Specimen: BLOOD LEFT FOREARM  Result Value Ref Range  Status   Specimen Description   Final    BLOOD LEFT FOREARM BOTTLES DRAWN AEROBIC AND ANAEROBIC Performed at [facility], [address] [place], [place] [address]    Special Requests   Final    Blood Culture adequate volume Performed at [facility], [address], [place], [place] [address]    Culture  Setup Time   Final    AEROBIC BOTTLE ONLY GRAM POSITIVE COCCI Gram Stain Report Called to,Read Back By and Verified With: PHYLLIS MCKINNEY @ 1829 ON 02/26/22 C VARNER CRITICAL RESULT CALLED TO, READ BACK BY AND VERIFIED WITH:  C/ PHARMD T. DANG 03/29/22 1719 A. LAFRANCE    Culture (A)  Final    STAPHYLOCOCCUS HOMINIS THE SIGNIFICANCE OF ISOLATING THIS ORGANISM FROM A SINGLE SET OF BLOOD CULTURES WHEN MULTIPLE SETS ARE DRAWN IS UNCERTAIN. PLEASE NOTIFY THE MICROBIOLOGY DEPARTMENT WITHIN ONE WEEK IF SPECIATION AND SENSITIVITIES ARE REQUIRED. Performed at [facility], [place] [address] [place], [place] [address]    Report Status 03/31/2022 FINAL  Final  Blood Culture ID Panel (Reflexed)     Status: Abnormal   Collection Time: 03/28/22  3:49 PM  Result Value Ref Range Status   Enterococcus faecalis NOT DETECTED NOT DETECTED Final   Enterococcus Faecium NOT DETECTED NOT DETECTED Final   Listeria monocytogenes NOT DETECTED NOT DETECTED Final   Staphylococcus species DETECTED (A) NOT DETECTED Final    Comment: CRITICAL RESULT CALLED TO, READ BACK BY AND VERIFIED WITH:  C/ PHARMD T. DANG 03/29/22 1719 A. LAFRANCE    Staphylococcus aureus (BCID) NOT DETECTED NOT DETECTED Final   Staphylococcus epidermidis NOT DETECTED NOT DETECTED Final   Staphylococcus lugdunensis NOT DETECTED NOT DETECTED Final   Streptococcus species NOT DETECTED NOT DETECTED Final   Streptococcus agalactiae NOT DETECTED NOT DETECTED Final   Streptococcus pneumoniae NOT DETECTED NOT DETECTED Final   Streptococcus pyogenes NOT DETECTED NOT DETECTED Final   A.calcoaceticus-baumannii NOT DETECTED NOT DETECTED Final    Bacteroides fragilis NOT DETECTED NOT DETECTED Final   Enterobacterales NOT DETECTED NOT DETECTED Final   Enterobacter cloacae complex NOT DETECTED NOT DETECTED Final   Escherichia coli NOT DETECTED NOT DETECTED Final   Klebsiella aerogenes NOT DETECTED NOT DETECTED Final   Klebsiella oxytoca NOT DETECTED NOT DETECTED Final   Klebsiella pneumoniae NOT DETECTED NOT DETECTED Final   Proteus species NOT DETECTED NOT DETECTED Final   Salmonella species NOT DETECTED NOT DETECTED Final   Serratia marcescens NOT DETECTED NOT DETECTED Final   Haemophilus influenzae NOT DETECTED NOT DETECTED Final   Neisseria meningitidis NOT DETECTED NOT DETECTED Final   Pseudomonas aeruginosa NOT DETECTED NOT DETECTED Final   Stenotrophomonas maltophilia NOT DETECTED NOT DETECTED Final   Candida albicans NOT DETECTED NOT DETECTED Final   Candida auris NOT DETECTED NOT DETECTED Final   Candida glabrata NOT DETECTED NOT DETECTED Final   Candida krusei NOT DETECTED NOT DETECTED Final   Candida parapsilosis NOT DETECTED NOT DETECTED Final   Candida tropicalis NOT DETECTED NOT DETECTED Final   Cryptococcus neoformans/gattii NOT DETECTED NOT DETECTED Final    Comment: Performed at [facility], [address], [place],  [address]  Blood Culture (routine x 2)     Status: None (Preliminary result)   Collection Time: 03/28/22  3:54 PM   Specimen: BLOOD RIGHT HAND  Result Value Ref Range Status   Specimen Description  Final    BLOOD RIGHT HAND BOTTLES DRAWN AEROBIC AND ANAEROBIC   Special Requests Blood Culture adequate volume  Final   Culture   Final    NO GROWTH 4 DAYS Performed at [facility], [address] [place], [place] [address]    Report Status PENDING  Incomplete  Resp panel by RT-PCR (RSV, Flu A&B, Covid) Anterior Nasal Swab     Status: None   Collection Time: 03/28/22  4:22 PM   Specimen: Anterior Nasal Swab  Result Value Ref Range Status   SARS Coronavirus 2 by RT PCR NEGATIVE  NEGATIVE Final    Comment: (NOTE) SARS-CoV-2 target nucleic acids are NOT DETECTED.  The SARS-CoV-2 RNA is generally detectable in upper respiratory specimens during the acute phase of infection. The lowest concentration of SARS-CoV-2 viral copies this assay can detect is 138 copies/mL. A negative result does not preclude SARS-Cov-2 infection and should not be used as the sole basis for treatment or other patient management decisions. A negative result may occur with  improper specimen collection/handling, submission of specimen other than nasopharyngeal swab, presence of viral mutation(s) within the areas targeted by this assay, and inadequate number of viral copies(<138 copies/mL). A negative result must be combined with clinical observations, patient history, and epidemiological information. The expected result is Negative.  Fact Sheet for Patients:  EntrepreneurPulse.com.au  Fact Sheet for Healthcare Providers:  IncredibleEmployment.be  This test is no t yet approved or cleared by the [place] FDA and  has been authorized for detection and/or diagnosis of SARS-CoV-2 by FDA under an Emergency Use Authorization (EUA). This EUA will remain  in effect (meaning this test can be used) for the duration of the COVID-19 declaration under Section 564(b)(1) of the Act, 21 U.S.C.section 360bbb-3(b)(1), unless the authorization is terminated  or revoked sooner.       Influenza A by PCR NEGATIVE NEGATIVE Final   Influenza B by PCR NEGATIVE NEGATIVE Final    Comment: (NOTE) The Xpert Xpress SARS-CoV-2/FLU/RSV plus assay is intended as an aid in the diagnosis of influenza from Nasopharyngeal swab specimens and should not be used as a sole basis for treatment. Nasal washings and aspirates are unacceptable for Xpert Xpress SARS-CoV-2/FLU/RSV testing.  Fact Sheet for Patients: EntrepreneurPulse.com.au  Fact Sheet for Healthcare  Providers: IncredibleEmployment.be  This test is not yet approved or cleared by the [place] FDA and has been authorized for detection and/or diagnosis of SARS-CoV-2 by FDA under an Emergency Use Authorization (EUA). This EUA will remain in effect (meaning this test can be used) for the duration of the COVID-19 declaration under Section 564(b)(1) of the Act, 21 U.S.C. section 360bbb-3(b)(1), unless the authorization is terminated or revoked.     Resp Syncytial Virus by PCR NEGATIVE NEGATIVE Final    Comment: (NOTE) Fact Sheet for Patients: EntrepreneurPulse.com.au  Fact Sheet for Healthcare Providers: IncredibleEmployment.be  This test is not yet approved or cleared by the [place] FDA and has been authorized for detection and/or diagnosis of SARS-CoV-2 by FDA under an Emergency Use Authorization (EUA). This EUA will remain in effect (meaning this test can be used) for the duration of the COVID-19 declaration under Section 564(b)(1) of the Act, 21 U.S.C. section 360bbb-3(b)(1), unless the authorization is terminated or revoked.  Performed at [facility], [address] [place], [place] [address]   Urine Culture     Status: None   Collection Time: 03/28/22  4:22 PM   Specimen: Urine, Catheterized  Result Value Ref Range  Status   Specimen Description   Final    URINE, CATHETERIZED Performed at [facility], [address] [place], [place] [address]    Special Requests   Final    NONE Performed at [facility], [address], [place], [place] [address]    Culture   Final    NO GROWTH Performed at [facility], [place] [address], [place], [place] [address]    Report Status 03/30/2022 FINAL  Final  Urine Culture (for pregnant, neutropenic or urologic patients or patients with an indwelling urinary catheter)     Status: None   Collection Time: 03/28/22  9:09 PM   Specimen: Urine, Clean Catch  Result Value  Ref Range Status   Specimen Description URINE, CLEAN CATCH  Final   Special Requests NONE  Final   Culture   Final    NO GROWTH Performed at [facility], [place] [address], [place], [place] [address]    Report Status 03/30/2022 FINAL  Final  Culture, Respiratory w Gram Stain     Status: None (Preliminary result)   Collection Time: 03/28/22  9:09 PM   Specimen: Tracheal Aspirate; Respiratory  Result Value Ref Range Status   Specimen Description TRACHEAL ASPIRATE  Final   Special Requests NONE  Final   Gram Stain   Final    RARE WBC PRESENT, PREDOMINANTLY PMN FEW GRAM POSITIVE COCCI IN PAIRS    Culture   Final    MODERATE STREPTOCOCCUS PNEUMONIAE CULTURE REINCUBATED FOR BETTER GROWTH Performed at [facility], [place] [address], [place], [place] [address]    Report Status PENDING  Incomplete  Respiratory (~20 pathogens) panel by PCR     Status: Abnormal   Collection Time: 03/28/22  9:09 PM   Specimen: Tracheal Aspirate; Respiratory  Result Value Ref Range Status   Adenovirus NOT DETECTED NOT DETECTED Final   Coronavirus 229E NOT DETECTED NOT DETECTED Final    Comment: (NOTE) The Coronavirus on the Respiratory Panel, DOES NOT test for the novel  Coronavirus (2019 nCoV)    Coronavirus HKU1 NOT DETECTED NOT DETECTED Final   Coronavirus NL63 NOT DETECTED NOT DETECTED Final   Coronavirus OC43 DETECTED (A) NOT DETECTED Final   Metapneumovirus NOT DETECTED NOT DETECTED Final   Rhinovirus / Enterovirus NOT DETECTED NOT DETECTED Final   Influenza A NOT DETECTED NOT DETECTED Final   Influenza B NOT DETECTED NOT DETECTED Final   Parainfluenza Virus 1 NOT DETECTED NOT DETECTED Final   Parainfluenza Virus 2 NOT DETECTED NOT DETECTED Final   Parainfluenza Virus 3 NOT DETECTED NOT DETECTED Final   Parainfluenza Virus 4 NOT DETECTED NOT DETECTED Final   Respiratory Syncytial Virus NOT DETECTED NOT DETECTED Final   Bordetella pertussis NOT DETECTED NOT DETECTED Final   Bordetella  Parapertussis NOT DETECTED NOT DETECTED Final   Chlamydophila pneumoniae NOT DETECTED NOT DETECTED Final   Mycoplasma pneumoniae NOT DETECTED NOT DETECTED Final    Comment: Performed at [facility], [place], [place], [place] [address]  MRSA Next Gen by PCR, Nasal     Status: Abnormal   Collection Time: 03/28/22  9:09 PM   Specimen: Nasal Mucosa; Nasal Swab  Result Value Ref Range Status   MRSA by PCR Next Gen DETECTED (A) NOT DETECTED Final    Comment: RESULT CALLED TO, READ BACK BY AND VERIFIED WITH: SAVAGE RN 03/28/22 @ 9371 BY AB (NOTE) The GeneXpert MRSA Assay (FDA approved for NASAL specimens only), is one  component of a comprehensive MRSA colonization surveillance program. It is not intended to diagnose MRSA infection nor to guide or monitor treatment for MRSA infections. Test performance is not FDA approved in patients less than 33 years old. Performed at [facility], [place] [address], [place], [place] [address]     RADIOLOGY STUDIES/RESULTS: DG Swallowing Func-Speech Pathology  Result Date: 03/31/2022 Table formatting from the original result was not included. Modified Barium Swallow Study Patient Details Name: KILYNN FITZSIMMONS MRN: [id number] Date of Birth: 03/02/1955 Today's Date: 03/31/2022 HPI/PMH: HPI: 67 yo F  admitted to [facility] ED 2/11 with respiratory failure.  Pt intubated 2/11 and tx'd to [facility].  Extubated 2/12. CXR showing multifocal opacities concerning for multifocal pneumonia.  PMH of COPD, Asthma, CAD, GERD. CVA, lung cancer, aortic stenosis s/p TAVR. Followed by SLP during Nov 2019 admission, requiring multiple MBS studies due to impaired swallowing, which improved prior to D/C. Clinical Impression: Clinical Impression: Patient presents with a mild pharyngeal phase dysphagia as per this MBS. Swallow was initiated at level of pyriform sinus for thin and nectar thick liquids and vallecular sinus for puree, mechanical soft solids and honey thick liquids. Trace  amount of thin liquid and nectar thick liquid barium penetrated in the laryngeal vestibule during the swallow but observed to exit fully. Penetration was above the level of the vocal cords. Only a trace amount of vallecular sinus residuals remained post initial swallow with nectar thick and thin liquids. Patient was unable to transit 47mm barium tablet from anterior to posterior portion of oral cavity. Straw used as a compesatory strategy and no significant difference in swallow function observed with straw as compared to consecutive cup sips. SLP recommending Dys 3 (mechanical soft) solids and thin liquids. Factors that may increase risk of adverse event in presence of aspiration ([place] 2021): No data recorded Recommendations/Plan: Swallowing Evaluation Recommendations Swallowing Evaluation Recommendations Recommendations: PO diet PO Diet Recommendation: Dysphagia 3 (Mechanical soft); Thin liquids (Level 0) Liquid Administration via: Cup; Straw Medication Administration: Whole meds with puree Supervision: Patient able to self-feed; Intermittent supervision/cueing for swallowing strategies Swallowing strategies  : Slow rate; Small bites/sips Postural changes: Position pt fully upright for meals; Stay upright 30-60 min after meals Oral care recommendations: Oral care BID (2x/day) Treatment Plan Treatment Plan Treatment recommendations: Therapy as outlined in treatment plan below Follow-up recommendations: No SLP follow up Functional status assessment: Patient has had a recent decline in their functional status and demonstrates the ability to make significant improvements in function in a reasonable and predictable amount of time. Treatment frequency: Min 2x/week Treatment duration: 2 weeks Interventions: Aspiration precaution training; Compensatory techniques; Patient/family education; Diet toleration management by SLP Recommendations Recommendations for follow up therapy are one component of a  multi-disciplinary discharge planning process, led by the attending physician.  Recommendations may be updated based on patient status, additional functional criteria and insurance authorization. Assessment: Orofacial Exam: Orofacial Exam Oral Cavity: Oral Hygiene: WFL Oral Cavity - Dentition: Edentulous; Other (Comment) Orofacial Anatomy: WFL Oral Motor/Sensory Function: WFL Anatomy: Anatomy: WFL Thin Liquids: Thin Liquids (Level 0) Thin Liquids : Impaired Bolus delivery method: Spoon; Cup; Straw Thin Liquid - Impairment: Pharyngeal impairment Initiation of swallow : Pyriform sinuses Soft palate elevation: No bolus between soft palate (SP)/pharyngeal wall (PW) Laryngeal elevation: Complete superior movement of thyroid cartilage with complete approximation of arytenoids to epiglottic petiole Anterior hyoid excursion: Partial Epiglottic movement: Complete Laryngeal vestibule closure: Incomplete, narrow column air/contrast in laryngeal vestibule Pharyngeal stripping wave :  Present - complete Pharyngoesophageal segment opening: Complete distension and complete duration, no obstruction of flow Tongue base retraction: No contrast between tongue base and posterior pharyngeal wall (PPW); Trace column of contrast or air between tongue base and PPW Pharyngeal residue: Trace residue within or on pharyngeal structures Location of pharyngeal residue: Valleculae Penetration/Aspiration Scale (PAS) score: 2.  Material enters airway, remains ABOVE vocal cords then ejected out  Mildly Thick Liquids: Mildly thick liquids (Level 2, nectar thick) Mildly thick liquids (Level 2, nectar thick): Impaired Bolus delivery method: Cup; Spoon Mildly Thick Liquid - Impairment: Pharyngeal impairment Laryngeal vestibule closure: Incomplete, narrow column air/contrast in laryngeal vestibule Pharyngeal residue: Trace residue within or on pharyngeal structures Location of pharyngeal residue: Valleculae; Pyriform sinuses Penetration/Aspiration Scale  (PAS) score: 2.  Material enters airway, remains ABOVE vocal cords then ejected out  Moderately Thick Liquids: Moderately thick liquids (Level 3, honey thick) Moderately thick liquids (Level 3, honey thick): Impaired Bolus delivery method: Spoon Moderately Thick Liquid - Impairment: Pharyngeal impairment Pharyngeal residue: Trace residue within or on pharyngeal structures Location of pharyngeal residue: Valleculae Penetration/Aspiration Scale (PAS) score: 1.  Material does not enter airway  Puree: Puree Puree: WFL Solid: Solid Solid: Impaired Solid - Impairment: Oral Impairment Bolus preparation/mastication: Slow prolonged chewing/mashing with complete recollection Bolus transport/lingual motion: Slow tongue motion Pill: Pill Pill: Impaired Consistency administered : thin Pill - Impairment: Oral Impairment Bolus transport/lingual motion: Slow tongue motion Oral residue: Majority of bolus remaining Location of oral residue : Tongue Compensatory Strategies: Compensatory Strategies Compensatory strategies: Yes Straw: Ineffective   General Information: Caregiver present: No  Diet Prior to this Study: Regular; Mildly thick liquids (Level 2, nectar thick)   Temperature : Normal   Respiratory Status: WFL   Supplemental O2: Nasal cannula   History of Recent Intubation: No  Behavior/Cognition: Alert; Pleasant mood; Cooperative Self-Feeding Abilities: Able to self-feed Baseline vocal quality/speech: Normal Volitional Cough: Able to elicit Volitional Swallow: Able to elicit No data recorded Goal Planning: Prognosis for improved oropharyngeal function: Good No data recorded No data recorded No data recorded Consulted and agree with results and recommendations: Patient; Nurse Pain: Pain Assessment Pain Assessment: No/denies pain Faces Pain Scale: 4 Facial Expression: 0 Body Movements: 0 Muscle Tension: 0 Compliance with ventilator (intubated pts.): N/A Vocalization (extubated pts.): 0 CPOT Total: 0 Pain Location: generalized  Pain Descriptors / Indicators: Grimacing Pain Intervention(s): Monitored during session; Limited activity within patient's tolerance; Repositioned End of Session: Start Time:SLP Start Time (ACUTE ONLY): 1345 Stop Time: SLP Stop Time (ACUTE ONLY): 1405 Time Calculation:SLP Time Calculation (min) (ACUTE ONLY): 20 min Charges: SLP Evaluations $ SLP Speech Visit: 1 Visit SLP Evaluations $BSS Swallow: 1 Procedure $MBS Swallow: 1 Procedure $Swallowing Treatment: 1 Procedure SLP visit diagnosis: SLP Visit Diagnosis: Dysphagia, oropharyngeal phase (R13.12) Past Medical History: Past Medical History: Diagnosis Date  Anxiety   Asthma   COPD (chronic obstructive pulmonary disease) (HCC)   Coronary artery disease   a. s/p NSTEMI in 11/2016 and required CABG with LIMA-LAD, SVG-LCx and SVG-D1 and complicated by cardiogenic shock b. cath in 05/2020 showing severe RCA stenosis and treated with orbital atherectomy and stent placement  GERD (gastroesophageal reflux disease)   History of CVA (cerebrovascular accident)   History of lung cancer 2020  Peripheral vascular disease (HCC)   S/P TAVR (transcatheter aortic valve replacement) 08/12/2020  s/p TAVR with a 23 mm Edwards S3U via the TF approach by Dr. Angelena Form & Dr. Cyndia Bent  Severe aortic stenosis  Past Surgical History: Past  Surgical History: Procedure Laterality Date  AORTIC ARCH ANGIOGRAPHY N/A 11/04/2017  Procedure: AORTIC ARCH ANGIOGRAPHY;  Surgeon: Elam Dutch, MD;  Location: [place] [facility];  Service: Cardiovascular;  Laterality: N/A;  APPLICATION OF WOUND VAC N/A 35/36/1443  Procedure: APPLICATION OF WOUND VAC;  Surgeon: Ivin Poot, MD;  Location: [place];  Service: Thoracic;  Laterality: N/A;  APPLICATION OF WOUND VAC N/A 01/13/2018  Procedure: WOUND VAC CHANGE;  Surgeon: Ivin Poot, MD;  Location: [place];  Service: Thoracic;  Laterality: N/A;  CARDIAC CATHETERIZATION    CAROTID-SUBCLAVIAN BYPASS GRAFT Right 12/26/2017  Procedure: AORTIC TO RIGHT COMMON  CAROTID AND RIGHT SUBCLAVIAN  ARTERY  BYPASS;  Surgeon: Elam Dutch, MD;  Location: [place];  Service: Vascular;  Laterality: Right;  CHOLECYSTECTOMY    CLOSURE OF DIAPHRAGM  12/26/2017  Procedure: REPAIR OF DIAPHRAGM;  Surgeon: Ivin Poot, MD;  Location: [place];  Service: Thoracic;;  COLONOSCOPY N/A 10/03/2014  Procedure: COLONOSCOPY;  Surgeon: Rogene Houston, MD;  [facility];  Service: Endoscopy;  Laterality: N/A;  730  CORONARY ARTERY BYPASS GRAFT N/A 12/14/2016  Procedure: CORONARY ARTERY BYPASS GRAFTING (CABG) x three , using left internal mammary artery and right leg greater saphenous vein harvested endoscopically;  Surgeon: Ivin Poot, MD;  Location: [place];  Service: Open Heart Surgery;  Laterality: N/A;  CORONARY ATHERECTOMY N/A 06/11/2020  Procedure: CORONARY ATHERECTOMY;  Surgeon: Burnell Blanks, MD;  Location: [place] [facility];  Service: Cardiovascular;  Laterality: N/A;  CORONARY STENT INTERVENTION N/A 06/11/2020  Procedure: CORONARY STENT INTERVENTION;  Surgeon: Burnell Blanks, MD;  Location: [place] [facility];  Service: Cardiovascular;  Laterality: N/A;  ENDARTERECTOMY Right 12/06/2014  Procedure: ENDARTERECTOMY CAROTid;  Surgeon: Elam Dutch, MD;  Location: [facility];  Service: Vascular;  Laterality: Right;  ESOPHAGOGASTRODUODENOSCOPY N/A 12/03/2016  Procedure: ESOPHAGOGASTRODUODENOSCOPY (EGD);  Surgeon: Rogene Houston, MD;  [facility];  Service: Endoscopy;  Laterality: N/A;  7:30  INTRAVASCULAR IMAGING/OCT N/A 06/11/2020  Procedure: INTRAVASCULAR IMAGING/OCT;  Surgeon: Burnell Blanks, MD;  Location: [place] [facility];  Service: Cardiovascular;  Laterality: N/A;  INTRAVASCULAR PRESSURE WIRE/FFR STUDY N/A 06/04/2020  Procedure: INTRAVASCULAR PRESSURE WIRE/FFR STUDY;  Surgeon: Burnell Blanks, MD;  Location: [place] [facility];  Service: Cardiovascular;  Laterality: N/A;  LEFT HEART CATH AND CORONARY ANGIOGRAPHY N/A  12/14/2016  Procedure: LEFT HEART CATH AND CORONARY ANGIOGRAPHY;  Surgeon: Burnell Blanks, MD;  Location: [place] [facility];  Service: Cardiovascular;  Laterality: N/A;  LOBECTOMY Right 10/26/2018  Procedure: RIGHT UPPER LOBECTOMY;  Surgeon: Lajuana Matte, MD;  Location: [place];  Service: Thoracic;  Laterality: Right;  PERIPHERAL VASCULAR CATHETERIZATION N/A 12/05/2014  Procedure:  Carotid Arch Angiography;  Surgeon: Conrad [place], MD;  Location: [place] [facility];  Service: Cardiovascular;  Laterality: N/A;  RIGHT/LEFT HEART CATH AND CORONARY/GRAFT ANGIOGRAPHY N/A 06/04/2020  Procedure: RIGHT/LEFT HEART CATH AND CORONARY/GRAFT ANGIOGRAPHY;  Surgeon: Burnell Blanks, MD;  Location: [place] [facility];  Service: Cardiovascular;  Laterality: N/A;  STERNAL WOUND DEBRIDEMENT N/A 01/06/2018  Procedure: STERNAL WOUND DEBRIDEMENT;  Surgeon: Ivin Poot, MD;  Location: [place];  Service: Thoracic;  Laterality: N/A;  STERNOTOMY N/A 12/26/2017  Procedure: REDO STERNOTOMY;  Surgeon: Prescott Gum, Collier Salina, MD;  Location: [place];  Service: Thoracic;  Laterality: N/A;  TEE WITHOUT CARDIOVERSION N/A 12/14/2016  Procedure: TRANSESOPHAGEAL ECHOCARDIOGRAM (TEE);  Surgeon: Prescott Gum, Collier Salina, MD;  Location: [place];  Service:  Open Heart Surgery;  Laterality: N/A;  TEE WITHOUT CARDIOVERSION N/A 08/12/2020  Procedure: TRANSESOPHAGEAL ECHOCARDIOGRAM (TEE);  Surgeon: Burnell Blanks, MD;  Location: [place] [facility];  Service: Open Heart Surgery;  Laterality: N/A;  TRANSCATHETER AORTIC VALVE REPLACEMENT, TRANSFEMORAL N/A 08/12/2020  Procedure: TRANSCATHETER AORTIC VALVE REPLACEMENT, TRANSFEMORAL;  Surgeon: Burnell Blanks, MD;  Location: [place] [facility];  Service: Open Heart Surgery;  Laterality: N/A;  VIDEO ASSISTED THORACOSCOPY (VATS)/WEDGE RESECTION Right 10/26/2018  Procedure: VIDEO ASSISTED THORACOSCOPY (VATS)/WEDGE RESECTION;  Surgeon: Lajuana Matte, MD;  Location: [place];  Service: Thoracic;   Laterality: Right;  VIDEO BRONCHOSCOPY N/A 10/26/2018  Procedure: VIDEO BRONCHOSCOPY;  Surgeon: Lajuana Matte, MD;  Location[facility];  Service: Thoracic;  Laterality: N/A; Sonia Baller, MA, CCC-SLP Speech Therapy   ECHOCARDIOGRAM COMPLETE  Result Date: 03/30/2022    ECHOCARDIOGRAM REPORT   Patient Name:   [name] Date of Exam: 03/30/2022 Medical Rec #:  [id number]    Height:       64.0 in Accession #:    [id number]   Weight:       146.4 lb Date of Birth:  07/12/1955    BSA:          1.713 m Patient Age:    45 years     BP:           131/56 mmHg Patient Gender: F            HR:           82 bpm. Exam Location:  Inpatient Procedure: 2D Echo, Color Doppler, Cardiac Doppler and Intracardiac            Opacification Agent Indications:    Troponin level elevated [[id number]]  History:        Patient has prior history of Echocardiogram examinations, most                 recent 10/25/2019. CAD, Sepsis, Aortic Valve Disease; Risk                 Factors:Current Smoker.                 Aortic Valve: 23 mm Edwards Ultra, stented (TAVR) valve is                 present in the aortic position. Procedure Date: 08/12/20.  Sonographer:    Greer Pickerel Referring Phys: [phone] PAULA B SIMPSON  Sonographer Comments: Image acquisition challenging due to patient body habitus, Image acquisition challenging due to COPD and Image acquisition challenging due to respiratory motion. IMPRESSIONS  1. Left ventricular ejection fraction, by estimation, is 55 to 60%. The left ventricle has normal function. Left ventricular endocardial border not optimally defined to evaluate regional wall motion. Diastolic function indeterminant due to severe MAC.  2. Right ventricular systolic function is moderately reduced. The right ventricular size is moderately enlarged. There is mildly elevated pulmonary artery systolic pressure. The estimated right ventricular systolic pressure is 09.6 mmHg.  3. Left atrial size was moderately dilated.  4. Right  atrial size was severely dilated.  5. The mitral valve is degenerative. Mild mitral valve regurgitation. Severe mitral annular calcification.  6. The aortic valve has been repaired/replaced. There is a 23 mm Edwards Ultra, stented (TAVR) valve present in the aortic position. Procedure Date: 08/12/20. Echo findings are consistent with normal structure and function of the aortic valve prosthesis.  Aortic valve mean gradient measures 12.0 mmHg. Aortic  valve Vmax measures 2.38 m/s. There is trivial paravalvular leak.  7. Aortic dilatation noted. There is borderline dilatation of the aortic root, measuring 37 mm.  8. The inferior vena cava is dilated in size with <50% respiratory variability, suggesting right atrial pressure of 15 mmHg.  9. Tricuspid valve regurgitation is mild to moderate. FINDINGS  Left Ventricle: Left ventricular ejection fraction, by estimation, is 55 to 60%. The left ventricle has normal function. Left ventricular endocardial border not optimally defined to evaluate regional wall motion. Definity contrast agent was given IV to delineate the left ventricular endocardial borders. The left ventricular internal cavity size was normal in size. There is no left ventricular hypertrophy. Diastolic function indeterminant due to severe MAC. Right Ventricle: The right ventricular size is moderately enlarged. Right vetricular wall thickness was not well visualized. Right ventricular systolic function is moderately reduced. There is mildly elevated pulmonary artery systolic pressure. The tricuspid regurgitant velocity is 2.47 m/s, and with an assumed right atrial pressure of 15 mmHg, the estimated right ventricular systolic pressure is 56.8 mmHg. Left Atrium: Left atrial size was moderately dilated. Right Atrium: Right atrial size was severely dilated. Pericardium: There is no evidence of pericardial effusion. Mitral Valve: The mitral valve is degenerative in appearance. Severe mitral annular calcification. Mild  mitral valve regurgitation. Tricuspid Valve: The tricuspid valve is normal in structure. Tricuspid valve regurgitation is mild to moderate. Aortic Valve: Trivial paravalvular leak. The aortic valve has been repaired/replaced. Aortic valve mean gradient measures 12.0 mmHg. Aortic valve peak gradient measures 22.7 mmHg. Aortic valve area, by VTI measures 1.76 cm. There is a 23 mm Edwards Ultra, stented (TAVR) valve present in the aortic position. Procedure Date: 08/12/20. Echo findings are consistent with normal structure and function of the aortic valve prosthesis. Pulmonic Valve: The pulmonic valve was normal in structure. Pulmonic valve regurgitation is trivial. Aorta: Aortic dilatation noted. There is borderline dilatation of the aortic root, measuring 37 mm. Venous: The inferior vena cava is dilated in size with less than 50% respiratory variability, suggesting right atrial pressure of 15 mmHg. IAS/Shunts: The atrial septum is grossly normal.  LEFT VENTRICLE PLAX 2D LVIDd:         4.40 cm   Diastology LVIDs:         3.20 cm   LV e' medial:    5.70 cm/s LV PW:         1.00 cm   LV E/e' medial:  24.0 LV IVS:        0.80 cm   LV e' lateral:   12.40 cm/s LVOT diam:     2.30 cm   LV E/e' lateral: 11.0 LV SV:         87 LV SV Index:   51 LVOT Area:     4.15 cm  RIGHT VENTRICLE RV S prime:     6.64 cm/s TAPSE (M-mode): 1.1 cm LEFT ATRIUM             Index        RIGHT ATRIUM           Index LA diam:        5.70 cm 3.33 cm/m   RA Area:     26.70 cm LA Vol (A2C):   72.8 ml 42.49 ml/m  RA Volume:   85.30 ml  49.79 ml/m LA Vol (A4C):   60.2 ml 35.14 ml/m LA Biplane Vol: 67.3 ml 39.28 ml/m  AORTIC VALVE  PULMONIC VALVE AV Area (Vmax):    1.75 cm      PR End Diast Vel: 3.10 msec AV Area (Vmean):   1.64 cm AV Area (VTI):     1.76 cm AV Vmax:           238.00 cm/s AV Vmean:          163.000 cm/s AV VTI:            0.497 m AV Peak Grad:      22.7 mmHg AV Mean Grad:      12.0 mmHg LVOT Vmax:          100.00 cm/s LVOT Vmean:        64.300 cm/s LVOT VTI:          0.210 m LVOT/AV VTI ratio: 0.42  AORTA Ao Root diam: 3.90 cm MITRAL VALVE                TRICUSPID VALVE MV Area (PHT): 4.60 cm     TR Peak grad:   24.4 mmHg MV Decel Time: 165 msec     TR Vmax:        247.00 cm/s MV E velocity: 137.00 cm/s MV A velocity: 107.00 cm/s  SHUNTS MV E/A ratio:  1.28         Systemic VTI:  0.21 m                             Systemic Diam: 2.30 cm Gwyndolyn Kaufman MD Electronically signed by Gwyndolyn Kaufman MD Signature Date/Time: 03/30/2022/5:03:55 PM    Final      LOS: 4 days   Oren Binet, MD  [facility]    To contact the attending provider between 7A-7P or the covering provider during after hours 7P-7A, please log into the web site [email or web address] and access using universal [place] password for that web site. If you do not have the password, please call the hospital operator.  04/01/2022, 10:55 AM

## 2022-04-02 DIAGNOSIS — J9602 Acute respiratory failure with hypercapnia: Secondary | ICD-10-CM | POA: Diagnosis not present

## 2022-04-02 DIAGNOSIS — A419 Sepsis, unspecified organism: Secondary | ICD-10-CM | POA: Diagnosis not present

## 2022-04-02 DIAGNOSIS — R7989 Other specified abnormal findings of blood chemistry: Secondary | ICD-10-CM | POA: Diagnosis not present

## 2022-04-02 DIAGNOSIS — J189 Pneumonia, unspecified organism: Secondary | ICD-10-CM | POA: Diagnosis not present

## 2022-04-02 LAB — CULTURE, RESPIRATORY W GRAM STAIN

## 2022-04-02 LAB — COMPREHENSIVE METABOLIC PANEL
ALT: 67 U/L — ABNORMAL HIGH (ref 0–44)
AST: 36 U/L (ref 15–41)
Albumin: 2.8 g/dL — ABNORMAL LOW (ref 3.5–5.0)
Alkaline Phosphatase: 107 U/L (ref 38–126)
Anion gap: 10 (ref 5–15)
BUN: 22 mg/dL (ref 8–23)
CO2: 40 mmol/L — ABNORMAL HIGH (ref 22–32)
Calcium: 8.9 mg/dL (ref 8.9–10.3)
Chloride: 86 mmol/L — ABNORMAL LOW (ref 98–111)
Creatinine, Ser: 0.47 mg/dL (ref 0.44–1.00)
GFR, Estimated: >60 mL/min (ref >60)
Glucose, Bld: 168 mg/dL — ABNORMAL HIGH (ref 70–99)
Potassium: 4.8 mmol/L (ref 3.5–5.1)
Sodium: 136 mmol/L (ref 135–145)
Total Bilirubin: 0.9 mg/dL (ref 0.3–1.2)
Total Protein: 6.1 g/dL — ABNORMAL LOW (ref 6.5–8.1)

## 2022-04-02 LAB — BLOOD GAS, ARTERIAL
Acid-Base Excess: 22.9 mmol/L — ABNORMAL HIGH (ref 0.0–2.0)
Bicarbonate: 53.3 mmol/L — ABNORMAL HIGH (ref 20.0–28.0)
O2 Saturation: 97.7 %
Patient temperature: 36.7
pCO2 arterial: 87 mmHg (ref 32–48)
pH, Arterial: 7.39 (ref 7.35–7.45)
pO2, Arterial: 86 mmHg (ref 83–108)

## 2022-04-02 LAB — GLUCOSE, CAPILLARY
Glucose-Capillary: 157 mg/dL — ABNORMAL HIGH (ref 70–99)
Glucose-Capillary: 221 mg/dL — ABNORMAL HIGH (ref 70–99)
Glucose-Capillary: 165 mg/dL — ABNORMAL HIGH (ref 70–99)
Glucose-Capillary: 119 mg/dL — ABNORMAL HIGH (ref 70–99)

## 2022-04-02 LAB — PHOSPHORUS: Phosphorus: 3.7 mg/dL (ref 2.5–4.6)

## 2022-04-02 LAB — MAGNESIUM: Magnesium: 2 mg/dL (ref 1.7–2.4)

## 2022-04-02 LAB — CBC
HCT: 43.4 % (ref 36.0–46.0)
Hemoglobin: 12.7 g/dL (ref 12.0–15.0)
MCH: 26.1 pg (ref 26.0–34.0)
MCHC: 29.3 g/dL — ABNORMAL LOW (ref 30.0–36.0)
MCV: 89.3 fL (ref 80.0–100.0)
Platelets: 224 10*3/uL (ref 150–400)
RBC: 4.86 MIL/uL (ref 3.87–5.11)
RDW: 16.2 % — ABNORMAL HIGH (ref 11.5–15.5)
WBC: 10.1 10*3/uL (ref 4.0–10.5)
nRBC: 0.3 % — ABNORMAL HIGH (ref 0.0–0.2)

## 2022-04-02 LAB — AMMONIA: Ammonia: 53 umol/L — ABNORMAL HIGH (ref 9–35)

## 2022-04-02 LAB — TROPONIN I (HIGH SENSITIVITY): Troponin I (High Sensitivity): 1126 ng/L (ref <18)

## 2022-04-02 MED ORDER — LACTULOSE 10 GM/15ML PO SOLN
20.0000 g | Freq: Two times a day (BID) | ORAL | Status: DC
  Administered 2022-04-02 – 2022-04-03 (×2): 20 g via ORAL
  Filled 2022-04-02 (×4): qty 30

## 2022-04-02 NOTE — Care Management Important Message (Signed)
Important Message  Patient Details  Name: Emily Rocha MRN: [id number] Date of Birth: [birth year]   Medicare Important Message Given:  Yes     Orbie Pyo 04/02/2022, 2:48 PM

## 2022-04-02 NOTE — Progress Notes (Signed)
Cross covering ICU physician.    Called to see pt for resp distress and wheeze along with ? "Subglottic stenosis" on xray imaging. Upon eval pt is reclining comfortably in bed. No acute distress. She is on 4L [place] and sats 98%. She does have wheeze but it does not sound upper airway.   She endorses that "my breathing is getting better every day". She offers no complaints at this time.   PCCM aware and will reassess in am.  Cont duoneb, steroids

## 2022-04-02 NOTE — NC FL2 (Signed)
[place] LEVEL OF CARE FORM     IDENTIFICATION  Patient Name: Emily Rocha Birthdate: 11-01-1955 Sex: female Admission Date (Current Location): 03/28/2022  [facility] and [place] Number:  [employer] and Address:  [facility] [place], [place] [address] [place], [place] [address]      Provider Number: [id number]  Attending Physician Name and Address:  Jonetta Osgood, MD  Relative Name and Phone Number:       Current Level of Care: Hospital Recommended Level of Care: [place] Prior Approval Number:    Date Approved/Denied:   PASRR Number: [id number]  Discharge Plan: SNF    Current Diagnoses: Patient Active Problem List   Diagnosis Date Noted   Respiratory failure ([place]) 03/28/2022   Acute respiratory failure with hypercapnia ([place]) 03/28/2022   Elevated LFTs 03/28/2022   Elevated troponin 03/28/2022   Hyperkalemia 03/28/2022   Sepsis with acute hypoxic respiratory failure and septic shock ([place]) 03/28/2022   Multifocal pneumonia 03/28/2022   History of CVA (cerebrovascular accident) 08/12/2020   COPD (chronic obstructive pulmonary disease) ([place]) 08/12/2020   S/P TAVR (transcatheter aortic valve replacement) 08/12/2020   CAD S/P percutaneous coronary angioplasty 06/11/2020   Severe aortic stenosis 05/19/2020   Chronic respiratory failure with hypoxia ([place]) 11/13/2019   Chronic diastolic CHF (congestive heart failure) ([place]) 11/13/2019   Subdural hematoma ([place]) 11/12/2019   Lip laceration 11/12/2019   Hypoalbuminemia 11/12/2019   Hiatal hernia 11/02/2019   History of lung cancer- Non-Small cell/S/p Prior VATs 10/24/2019   CAD (coronary artery disease)/Prior CABG 10/24/2019   S/P lobectomy of lung 10/26/2018   S/P CABG x 3 12/14/2016   GERD (gastroesophageal reflux disease) 12/12/2016   Carotid stenosis 12/19/2014    Orientation RESPIRATION BLADDER Height & Weight     Self, Time, Situation, Place  O2 (4L  nasal cannula; may need cpap) Incontinent, External catheter Weight: 143 lb 4.8 oz (65 kg) Height:  5\' 4"  (162.6 cm)  BEHAVIORAL SYMPTOMS/MOOD NEUROLOGICAL BOWEL NUTRITION STATUS      Incontinent Diet (See dc summary)  AMBULATORY STATUS COMMUNICATION OF NEEDS Skin   Limited Assist Verbally Normal                       Personal Care Assistance Level of Assistance  Bathing, Feeding, Dressing Bathing Assistance: Limited assistance Feeding assistance: Independent Dressing Assistance: Limited assistance     Functional Limitations Info  Hearing   Hearing Info: Impaired      SPECIAL CARE FACTORS FREQUENCY  PT (By licensed PT), OT (By licensed OT)     PT Frequency: 5x/week OT Frequency: 5x/week            Contractures Contractures Info: Not present    Additional Factors Info  Code Status, Allergies Code Status Info: Full Allergies Info: NKA           Current Medications (04/02/2022):  This is the current hospital active medication list Current Facility-Administered Medications  Medication Dose Route Frequency Provider Last Rate Last Admin   0.9 %  sodium chloride infusion  250 mL Intravenous Continuous Isla Pence, MD 10 mL/hr at 04/01/22 1800 Infusion Verify at 04/01/22 1800   0.9 %  sodium chloride infusion  250 mL Intravenous Continuous Mick Sell, PA-C   Paused at 03/30/22 1303   acetaminophen (TYLENOL) tablet 650 mg  650 mg Oral Q4H PRN Wilson Singer I, RPH       albuterol (PROVENTIL) (2.5 MG/3ML)  0.083% nebulizer solution 2.5 mg  2.5 mg Nebulization Q4H PRN Jennelle Human B, NP   2.5 mg at 04/02/22 0031   ALPRAZolam Duanne Moron) tablet 0.25 mg  0.25 mg Oral TID PRN Jennelle Human B, NP   0.25 mg at 04/01/22 2154   apixaban (ELIQUIS) tablet 5 mg  5 mg Oral BID Candee Furbish, MD   5 mg at 04/02/22 0915   arformoterol (BROVANA) nebulizer solution 15 mcg  15 mcg Nebulization BID Jennelle Human B, NP   15 mcg at 04/02/22 5631   aspirin chewable tablet 81 mg   81 mg Oral Daily Wilson Singer I, RPH   81 mg at 04/02/22 0912   budesonide (PULMICORT) nebulizer solution 0.5 mg  0.5 mg Nebulization BID Mick Sell, PA-C   0.5 mg at 04/02/22 0729   cefTRIAXone (ROCEPHIN) 2 g in sodium chloride 0.9 % 100 mL IVPB  2 g Intravenous Q24H Ventura Sellers, RPH 200 mL/hr at 04/02/22 0949 2 g at 04/02/22 0949   Chlorhexidine Gluconate Cloth 2 % PADS 6 each  6 each Topical Q0600 Collier Bullock, MD   6 each at 03/30/22 2133   Chlorhexidine Gluconate Cloth 2 % PADS 6 each  6 each Topical Q0600 Candee Furbish, MD   6 each at 04/02/22 0559   guaiFENesin (MUCINEX) 12 hr tablet 600 mg  600 mg Oral BID Jennelle Human B, NP   600 mg at 04/02/22 0915   insulin aspart (novoLOG) injection 0-15 Units  0-15 Units Subcutaneous TID WC Wilson Singer I, RPH   5 Units at 04/02/22 1216   lactulose (CHRONULAC) 10 GM/15ML solution 20 g  20 g Oral BID Jonetta Osgood, MD       methylPREDNISolone sodium succinate (SOLU-MEDROL) 125 mg/2 mL injection 120 mg  120 mg Intravenous Daily Shela Leff, MD   120 mg at 04/02/22 0916   metoprolol tartrate (LOPRESSOR) injection 5 mg  5 mg Intravenous Q4H PRN Paliwal, Aditya, MD       metoprolol tartrate (LOPRESSOR) tablet 37.5 mg  37.5 mg Oral BID Paliwal, Aditya, MD   37.5 mg at 04/02/22 0912   mupirocin ointment (BACTROBAN) 2 % 1 Application  1 Application Nasal BID Candee Furbish, MD   1 Application at 49/70/26 3785   nicotine (NICODERM CQ - dosed in mg/24 hours) patch 14 mg  14 mg Transdermal Daily Jennelle Human B, NP   14 mg at 04/02/22 0924   ondansetron (ZOFRAN) injection 4 mg  4 mg Intravenous Q6H PRN Candee Furbish, MD   4 mg at 04/01/22 1556   Oral care mouth rinse  15 mL Mouth Rinse PRN Jennelle Human B, NP       pantoprazole (PROTONIX) EC tablet 40 mg  40 mg Oral Daily Wilson Singer I, RPH   40 mg at 04/02/22 0915   polyethylene glycol (MIRALAX / GLYCOLAX) packet 17 g  17 g Oral Daily Wilson Singer I, RPH    17 g at 04/02/22 0911   polyethylene glycol (MIRALAX / GLYCOLAX) packet 17 g  17 g Oral Daily PRN Wilson Singer I, RPH       revefenacin (YUPELRI) nebulizer solution 175 mcg  175 mcg Nebulization Daily Candee Furbish, MD   175 mcg at 04/02/22 0729   torsemide (DEMADEX) tablet 40 mg  40 mg Oral Daily Candee Furbish, MD   40 mg at 04/02/22 0915     Discharge Medications: Please see discharge summary for a  list of discharge medications.  Relevant Imaging Results:  Relevant Lab Results:   Additional Information SSN: [place], [place]

## 2022-04-02 NOTE — TOC Initial Note (Addendum)
Transition of Care [facility]) - Initial/Assessment Note    Patient Details  Name: Emily Rocha MRN: [id number] Date of Birth: 16-Sep-1955  Transition of Care [facility]) CM/SW Contact:    Benard Halsted, LCSW Phone Number: 04/02/2022, 3:23 PM  Clinical Narrative:                 CSW received consult for possible SNF placement at time of discharge. CSW spoke with patient. Patient expressed understanding of PT recommendation and is agreeable to CSW sending referral for SNF placement to see options. Patient reports preference for [facility]. She would like to speak with her husband before making a decision on SNF or Home Health. Patient hard of hearing. Bipap at bedside, CSW asked patient if she had one at home and she said she did but CSW does not see that documented anywhere. If she does not have one and needs one, SNF would need settings and to order one. She does have home oxygen. CSW discussed insurance authorization process and will provide Medicare SNF ratings list. CSW will send out referrals for review and provide bed offers as available.   Skilled Nursing Rehab Facilities-   [email or web address]   Ratings out of 5 stars (5 the highest)   Name Address  Phone # Melvin Inspection Overall  [facility] [address], [place] 4 5 2 3   [facility]  [address], [place] [phone] 4 2 5 5   [facility] [place], [place] 1 3 1 1   [place], [place] 2 2 4 4   [facility] [address] [place] 2 1 2 1   [place], [place] [phone] 3 3 4 4   [facility] [address] [place] 4 1 3 2   [facility] [address], [place] 4 1 3 2   [facility] [place], [address] [phone] 3 1 2 1   [facility] [phone], [address] [phone] 3 1 1 1   [facility] [address] [facility] [phone] 3 2 2 2   [facility] [place]. [address], [place] [phone] 3 1 1 1   [place] [address] [place] 4 2 4 4           [facility] [address] [facility] [phone]      [facility] [place] 4 1 3 2   [facility] [place] [address] [place] 3 1 5 4   [place], [place] [phone] 1 1 2 1   [facility] [address], [facility] [phone] 2 2 4 4           [address] (no [facility]) [place] [facility], [place] [phone] 5 5 5 5   [facility] (No Humana) [address], [place] 4 1 4 3   [place] (No UHC) [place], [place] [phone] 5 5 5 5   [facility] [address], [facility] [phone] 2 3 5 5   [place] [address] [place] 1 1 2 1   [facility] [address] 3 1 1 1   [place], [place] 5 2 5 5   [facility]  [address] [place] 2 2 1 1   [facility] [address] [place] 3 2 1  [place], [place] 2 2 2 2           [place], [place] [phone] 1 1 1 1   [place] [address], [place]  [phone] 2 4 3 3   [facility] [place] [address], [place] [phone] 3 2 3 3   [place] [address] [place] 2 1 1 1   [place] (No Humana) [address] [place] [phone] 2 2 3 3   [facility] [address], [place] [phone] 2 1 1 1           [facility] [place], [place] 5 4 5 5   [facility])  [address], [place] 2 1  2 1   [facility]) [place] [address], [place] [phone] 3 1 4 3   [place] [address] [place] 3 3 4 4   [address] [place], [place] 2 3 1 1   [facility]) [address] [place] [phone] 2 1 4 3      Expected Discharge Plan: [place] Barriers to Discharge: Continued Medical Work up   Patient Goals and CMS Choice Patient states their goals for this hospitalization and ongoing recovery are:: Return home CMS Medicare.gov Compare Post Acute Care list provided to:: Patient Choice offered to / list presented to : Patient [place] ownership interest in [facility].provided to:: Patient    Expected Discharge Plan and Services In-house Referral: Clinical Social Work     Living arrangements for the past 2 months: Single Family Home                                      Prior Living Arrangements/Services Living arrangements for the past 2 months: Single Family Home Lives with:: Spouse Patient language and need for interpreter reviewed:: Yes Do you feel safe going back to the place where you live?: Yes      Need for Family Participation in Patient Care: Yes (Comment) Care giver support system in place?: Yes (comment) Current home services: DME (O2) Criminal Activity/Legal Involvement Pertinent to Current Situation/Hospitalization: No - Comment as needed  Activities of Daily Living Home Assistive Devices/Equipment: Oxygen ADL Screening (condition at time of admission) Patient's cognitive ability adequate to safely complete daily activities?: No Is the patient deaf or have difficulty hearing?: Yes Does the patient have difficulty seeing, even when wearing glasses/contacts?: No Does the patient have difficulty concentrating, remembering, or making decisions?: Yes Patient able to express need for assistance with ADLs?: Yes Does the patient have  difficulty dressing or bathing?: Yes Independently performs ADLs?: No Communication: Independent Dressing (OT): Needs assistance Is this a change from baseline?: Change from baseline, expected to last >3 days Grooming: Needs assistance Is this a change from baseline?: Change from baseline, expected to last >3 days Feeding: Needs assistance Is this a change from baseline?: Change from baseline, expected to last >3 days Bathing: Needs assistance Is this a change from baseline?: Change from baseline, expected to last >3 days Toileting: Needs assistance Is this a change from baseline?: Change from baseline, expected to last >3days In/Out Bed: Needs assistance Is this a change from baseline?: Change from baseline, expected to last >3 days Walks in Home: Needs assistance Is this a change from  baseline?: Change from baseline, expected to last >3 days Does the patient have difficulty walking or climbing stairs?: Yes Weakness of Legs: Both Weakness of Arms/Hands: Both  Permission Sought/Granted Permission sought to share information with : Facility Sport and exercise psychologist, Family Supports Permission granted to share information with : Yes, Verbal Permission Granted  Share Information with NAME: Dexter  Permission granted to share info w AGENCY: SNFs  Permission granted to share info w Relationship: Spouse  Permission granted to share info w Contact Information: [phone]  Emotional Assessment Appearance:: Appears stated age Attitude/Demeanor/Rapport: Engaged Affect (typically observed): Accepting, Appropriate, Pleasant Orientation: : Oriented to Self, Oriented to Place, Oriented to  Time, Oriented to Situation Alcohol / Substance Use: Not Applicable Psych Involvement: No (comment)  Admission diagnosis:  Respiratory failure ([place]) [J96.90] Hyperkalemia [E87.5] Elevated troponin [R79.89] Elevated LFTs [R79.89] Multifocal pneumonia [J18.9] Sepsis with acute hypoxic respiratory failure  and septic shock, due to unspecified organism ([place]) [A41.9, R65.21, J96.01] Acute respiratory failure with hypercapnia (HCC) [J96.02] Patient Active Problem List   Diagnosis Date Noted   Respiratory failure ([place]) 03/28/2022   Acute respiratory failure with hypercapnia (HCC) 03/28/2022   Elevated LFTs 03/28/2022   Elevated troponin 03/28/2022   Hyperkalemia 03/28/2022   Sepsis with acute hypoxic respiratory failure and septic shock ([place]) 03/28/2022   Multifocal pneumonia 03/28/2022   History of CVA (cerebrovascular accident) 08/12/2020   COPD (chronic obstructive pulmonary disease) ([place]) 08/12/2020   S/P TAVR (transcatheter aortic valve replacement) 08/12/2020   CAD S/P percutaneous coronary angioplasty 06/11/2020   Severe aortic stenosis 05/19/2020   Chronic respiratory failure with hypoxia ([place]) 11/13/2019   Chronic diastolic CHF (congestive heart failure) ([place]) 11/13/2019   Subdural hematoma (HCC) 11/12/2019   Lip laceration 11/12/2019   Hypoalbuminemia 11/12/2019   Hiatal hernia 11/02/2019   History of lung cancer- Non-Small cell/S/p Prior VATs 10/24/2019   CAD (coronary artery disease)/Prior CABG 10/24/2019   S/P lobectomy of lung 10/26/2018   S/P CABG x 3 12/14/2016   GERD (gastroesophageal reflux disease) 12/12/2016   Carotid stenosis 12/19/2014   PCP:  Glenda Chroman, MD Pharmacy:   [place] [place] [address] Phone: [phone] Fax: [place] [address] [place] [address] Phone: [phone] Fax: [phone]     Social Determinants of Health (SDOH) Social History: SDOH Screenings   Depression (PHQ2-9): Low Risk  (10/28/2020)  Tobacco Use: High Risk (03/28/2022)   SDOH Interventions:     Readmission Risk Interventions    08/14/2020    2:35 PM  Readmission Risk Prevention Plan  Post Dischage Appt Complete  Medication Screening Complete   Transportation Screening Complete

## 2022-04-02 NOTE — Progress Notes (Signed)
Per RN patient removed BIPAP mask after wearing it for a few minutes. RN placed patient back on bipap and patient removed it again. Patient was placed back on nasal cannula. RT attempted to speak to patient about wearing her BIPAP and she refused, patient stated that she will not wear it. RN informed.

## 2022-04-02 NOTE — Progress Notes (Signed)
PROGRESS NOTE        PATIENT DETAILS Name: ANICIA LEUTHOLD Age: 67 y.o. Sex: female Date of Birth: 1955-09-17 Admit Date: 03/28/2022 Admitting Physician Collier Bullock, MD VEL:FYBO, Costella Hatcher, MD  Brief Summary: Patient is a 67 y.o.  female w hx of COPD on Home O2, severe AS s/p TAVR, CAD-s/p CABG admitted via [facility] ED to [facility] ICU for acute on chronic combined hypoxic and hypercarbic respiratory failure-requiring intubation.  Stabilized in the ICU-hospital course complicated by development of A-fib with RVR.  Transferred to [facility] on 2/15.  Significant events: 2/11>> admit to ICU by PCCM 2/12>> extubated 2/13>> episode of A-fib RVR 2/15>> transferred to [facility] an episode of shortness of breath-CXR with possible subglottic stenosis or edema-changed to IV steroids.  Significant studies: 2/11>> CT chest/abdomen/pelvis: Multifocal pneumonia 2/12>> CT head: No acute intracranial abnormality 2/13>> echo: EF 17-51%, RV systolic function moderately reduced.  Normal structure/functioning of aortic valve prosthesis 2/15>> CXR: Narrowing of the subglottic airway may reflect stenosis/edema  Significant microbiology data: 2/11>> COVID/influenza/RSV PCR: Negative 2/11>> respiratory virus panel: +ve Coronavirus OC43 2/11>> blood culture: 1/2 Staph hominis (contamination) 2/11>> tracheal aspirate: Streptococcus pneumoniae  Procedures: 2/11-2/12>> ETT  Consults: PCCM.  Subjective: Had an episode of shortness of breath yesterday-briefly required BiPAP.  Feels much better this morning-not wheezing.  Acknowledges that she still smokes intermittently.  Objective: Vitals: Blood pressure 126/66, pulse 75, temperature 97.8 F (36.6 C), temperature source Oral, resp. rate (!) 24, height 5\' 4"  (1.626 m), weight 65 kg, SpO2 97 %.   Exam: Gen Exam:Alert awake-not in any distress HEENT:atraumatic, normocephalic Chest: Clear to auscultation this morning. CVS:S1S2  regular Abdomen:soft non tender, non distended Extremities:no edema Neurology: Non focal Skin: no rash  Pertinent Labs/Radiology:    Latest Ref Rng & Units 04/02/2022   10:01 AM 03/31/2022   12:27 AM 03/30/2022    2:04 AM  CBC  WBC 4.0 - 10.5 K/uL 10.1  11.3  11.7   Hemoglobin 12.0 - 15.0 g/dL 12.7  11.8  11.9   Hematocrit 36.0 - 46.0 % 43.4  40.4  39.8   Platelets 150 - 400 K/uL 224  207  187     Lab Results  Component Value Date   NA 136 04/02/2022   K 4.8 04/02/2022   CL 86 (L) 04/02/2022   CO2 40 (H) 04/02/2022      Assessment/Plan: Septic shock secondary to multifocal PNA with ARDS (coronavirus and streptococcal pneumonia on culture data) Sepsis physiology has resolved Culture data as above Continue Rocephin x 7 days total  Acute on chronic hypoxic and hypercarbic respiratory failure Due to PNA/COPD exacerbation Intubated on 2/11-extubated on 2/12 Continue antibiotics, steroid taper and bronchodilators  Acute metabolic encephalopathy Due to hypercarbia Resolved-Completely awake/alert this morning  COPD exacerbation Improved Taper steroids over the next few days Continue bronchodilators  ?  Subglottic edema Developed SOB last night-CXR with narrowing of the subglottic airway She does not appear to have any stridor/wheezing-not symptomatic Could be related to recent intubation related inflammation-now on IV steroids Await further recommendations from PCCM  A-fib RVR Occurred on 2/30 Back to sinus rhythm Echo as above Continue metoprolol Continue Eliquis  Elevated troponins likely due to demand ischemia No chest pain Echo with stable EF  CAD s/p CABG 2018 and PCI April 2022 Elevated troponins on admission likely due to demand ischemia  Continue statin/beta-blocker-no longer on aspirin as on Eliquis Will require cardiology follow-up at some point   History of severe aortic stenosis-s/p TAVR 2022 Echo shows normal structure/function of prosthetic  valve  Chronic diastolic heart failure Volume status relatively stable Continue Demadex  Mildly elevated ammonia levels Unclear etiology No history of cirrhosis Continue lactulose for a few more days  History of CVA Moving all 4 extremities Now on Eliquis  GERD PPI  History of T1 cN0 M0 adenocarcinoma the right upper lobe  s/p R VATS, RULectomy on 10/26/2018   Tobacco abuse Patient denies but per prior notes-ongoing use per husband Transdermal nicotine  Debility/deconditioning PT/OT eval ongoing-SNF recommended  BMI Estimated body mass index is 24.6 kg/m as calculated from the following:   Height as of this encounter: 5\' 4"  (1.626 m).   Weight as of this encounter: 65 kg.   Code status:   Code Status: Full Code   DVT Prophylaxis: SCDs Start: 03/28/22 2128 apixaban (ELIQUIS) tablet 5 mg    Family Communication: None at bedside   Disposition Plan: Status is: Inpatient Remains inpatient appropriate because: Severity of illness   Planned Discharge Destination:Skilled nursing facility   Diet: Diet Order             Diet Heart Room service appropriate? Yes; Fluid consistency: Thin  Diet effective now                     Antimicrobial agents: Anti-infectives (From admission, onward)    Start     Dose/Rate Route Frequency Ordered Stop   04/02/22 1000  cefTRIAXone (ROCEPHIN) 2 g in sodium chloride 0.9 % 100 mL IVPB        2 g 200 mL/hr over 30 Minutes Intravenous Every 24 hours 04/01/22 1419 04/04/22 0959   03/30/22 1400  vancomycin (VANCOREADY) IVPB 750 mg/150 mL  Status:  Discontinued        750 mg 150 mL/hr over 60 Minutes Intravenous Every 12 hours 03/30/22 1014 03/31/22 1518   03/30/22 1000  cefTRIAXone (ROCEPHIN) 2 g in sodium chloride 0.9 % 100 mL IVPB  Status:  Discontinued        2 g 200 mL/hr over 30 Minutes Intravenous Every 24 hours 03/30/22 0837 04/01/22 1419   03/30/22 1000  azithromycin (ZITHROMAX) tablet 500 mg  Status:  Discontinued         500 mg Oral Daily 03/30/22 0842 03/31/22 0823   03/30/22 0200  vancomycin (VANCOREADY) IVPB 500 mg/100 mL  Status:  Discontinued        500 mg 100 mL/hr over 60 Minutes Intravenous Every 12 hours 03/29/22 1454 03/30/22 1014   03/29/22 1415  vancomycin (VANCOREADY) IVPB 1500 mg/300 mL        1,500 mg 150 mL/hr over 120 Minutes Intravenous  Once 03/29/22 1315 03/29/22 1544   03/28/22 1600  cefTRIAXone (ROCEPHIN) 2 g in sodium chloride 0.9 % 100 mL IVPB  Status:  Discontinued        2 g 200 mL/hr over 30 Minutes Intravenous Every 24 hours 03/28/22 1549 03/30/22 0813   03/28/22 1600  azithromycin (ZITHROMAX) 500 mg in sodium chloride 0.9 % 250 mL IVPB  Status:  Discontinued        500 mg 250 mL/hr over 60 Minutes Intravenous Every 24 hours 03/28/22 1549 03/30/22 0842        MEDICATIONS: Scheduled Meds:  apixaban  5 mg Oral BID   arformoterol  15 mcg Nebulization BID  aspirin  81 mg Oral Daily   budesonide (PULMICORT) nebulizer solution  0.5 mg Nebulization BID   Chlorhexidine Gluconate Cloth  6 each Topical Q0600   Chlorhexidine Gluconate Cloth  6 each Topical Q0600   guaiFENesin  600 mg Oral BID   insulin aspart  0-15 Units Subcutaneous TID WC   lactulose  20 g Oral TID   methylPREDNISolone (SOLU-MEDROL) injection  120 mg Intravenous Daily   metoprolol tartrate  37.5 mg Oral BID   mupirocin ointment  1 Application Nasal BID   nicotine  14 mg Transdermal Daily   pantoprazole  40 mg Oral Daily   polyethylene glycol  17 g Oral Daily   revefenacin  175 mcg Nebulization Daily   torsemide  40 mg Oral Daily   Continuous Infusions:  sodium chloride 10 mL/hr at 04/01/22 1800   sodium chloride Stopped (03/30/22 1303)   cefTRIAXone (ROCEPHIN)  IV 2 g (04/02/22 0949)   PRN Meds:.acetaminophen, albuterol, ALPRAZolam, metoprolol tartrate, ondansetron (ZOFRAN) IV, mouth rinse, polyethylene glycol   I have personally reviewed following labs and imaging studies  LABORATORY  DATA: CBC: Recent Labs  Lab 03/28/22 1549 03/28/22 1551 03/28/22 2249 03/29/22 0542 03/30/22 0204 03/31/22 0027 04/02/22 1001  WBC 20.7*  --   --  10.6* 11.7* 11.3* 10.1  NEUTROABS 17.6*  --   --   --   --   --   --   HGB 13.2   < > 11.6* 11.2* 11.9* 11.8* 12.7  HCT 44.8   < > 34.0* 37.6 39.8 40.4 43.4  MCV 90.5  --   --  87.9 89.4 91.0 89.3  PLT 199  --   --  152 187 207 224   < > = values in this interval not displayed.     Basic Metabolic Panel: Recent Labs  Lab 03/29/22 0542 03/30/22 0204 03/31/22 0027 04/01/22 0233 04/02/22 1001  NA 136 138 135 136 136  K 5.0 5.1 5.6* 4.3 4.8  CL 96* 99 96* 90* 86*  CO2 26 29 30  36* 40*  GLUCOSE 166* 134* 110* 102* 168*  BUN 24* 35* 39* 33* 22  CREATININE 0.78 0.81 0.64 0.54 0.47  CALCIUM 8.6* 8.7* 9.0 8.9 8.9  MG 3.3* 3.5*  --  2.2 2.0  PHOS  --   --  3.1 2.5 3.7     GFR: Estimated Creatinine Clearance: 58.1 mL/min (by C-G formula based on SCr of 0.47 mg/dL).  Liver Function Tests: Recent Labs  Lab 03/28/22 1549 03/29/22 0542 03/31/22 0027 04/02/22 1001  AST 163* 78* 80* 36  ALT 166* 105* 101* 67*  ALKPHOS 147* 117 145* 107  BILITOT 1.9* 1.1 0.6 0.9  PROT 7.3 5.6* 6.6 6.1*  ALBUMIN 3.3* 2.3* 2.7* 2.8*    No results for input(s): "LIPASE", "AMYLASE" in the last 168 hours. Recent Labs  Lab 03/28/22 2320 03/30/22 0204 03/31/22 0027 04/01/22 0233 04/02/22 1001  AMMONIA 39* 47* 71* 46* 53*     Coagulation Profile: Recent Labs  Lab 03/28/22 1549  INR 1.2     Cardiac Enzymes: No results for input(s): "CKTOTAL", "CKMB", "CKMBINDEX", "TROPONINI" in the last 168 hours.  BNP (last 3 results) No results for input(s): "PROBNP" in the last 8760 hours.  Lipid Profile: No results for input(s): "CHOL", "HDL", "LDLCALC", "TRIG", "CHOLHDL", "LDLDIRECT" in the last 72 hours.  Thyroid Function Tests: No results for input(s): "TSH", "T4TOTAL", "FREET4", "T3FREE", "THYROIDAB" in the last 72 hours.  Anemia  Panel: No results for  input(s): "VITAMINB12", "FOLATE", "FERRITIN", "TIBC", "IRON", "RETICCTPCT" in the last 72 hours.  Urine analysis:    Component Value Date/Time   COLORURINE YELLOW 03/28/2022 1622   APPEARANCEUR CLEAR 03/28/2022 1622   LABSPEC 1.014 03/28/2022 1622   PHURINE 6.0 03/28/2022 1622   GLUCOSEU NEGATIVE 03/28/2022 1622   HGBUR NEGATIVE 03/28/2022 1622   BILIRUBINUR NEGATIVE 03/28/2022 1622   KETONESUR 5 (A) 03/28/2022 1622   PROTEINUR 30 (A) 03/28/2022 1622   UROBILINOGEN 0.2 12/04/2014 1737   NITRITE NEGATIVE 03/28/2022 1622   LEUKOCYTESUR NEGATIVE 03/28/2022 1622    Sepsis Labs: Lactic Acid, Venous    Component Value Date/Time   LATICACIDVEN 1.0 03/29/2022 0255    MICROBIOLOGY: Recent Results (from the past 240 hour(s))  Blood Culture (routine x 2)     Status: Abnormal   Collection Time: 03/28/22  3:49 PM   Specimen: BLOOD LEFT FOREARM  Result Value Ref Range Status   Specimen Description   Final    BLOOD LEFT FOREARM BOTTLES DRAWN AEROBIC AND ANAEROBIC Performed at [facility], [address], [place], [place] [address]    Special Requests   Final    Blood Culture adequate volume Performed at [facility], [address], [place], [place] [address]    Culture  Setup Time   Final    AEROBIC BOTTLE ONLY GRAM POSITIVE COCCI Gram Stain Report Called to,Read Back By and Verified With: PHYLLIS MCKINNEY @ 7846 ON 02/26/22 C VARNER CRITICAL RESULT CALLED TO, READ BACK BY AND VERIFIED WITH:  C/ PHARMD T. DANG 03/29/22 1719 A. LAFRANCE    Culture (A)  Final    STAPHYLOCOCCUS HOMINIS THE SIGNIFICANCE OF ISOLATING THIS ORGANISM FROM A SINGLE SET OF BLOOD CULTURES WHEN MULTIPLE SETS ARE DRAWN IS UNCERTAIN. PLEASE NOTIFY THE MICROBIOLOGY DEPARTMENT WITHIN ONE WEEK IF SPECIATION AND SENSITIVITIES ARE REQUIRED. Performed at [facility], [place] [address], [place], [place] [address]    Report Status 03/31/2022 FINAL  Final  Blood Culture ID Panel (Reflexed)      Status: Abnormal   Collection Time: 03/28/22  3:49 PM  Result Value Ref Range Status   Enterococcus faecalis NOT DETECTED NOT DETECTED Final   Enterococcus Faecium NOT DETECTED NOT DETECTED Final   Listeria monocytogenes NOT DETECTED NOT DETECTED Final   Staphylococcus species DETECTED (A) NOT DETECTED Final    Comment: CRITICAL RESULT CALLED TO, READ BACK BY AND VERIFIED WITH:  C/ PHARMD T. DANG 03/29/22 1719 A. LAFRANCE    Staphylococcus aureus (BCID) NOT DETECTED NOT DETECTED Final   Staphylococcus epidermidis NOT DETECTED NOT DETECTED Final   Staphylococcus lugdunensis NOT DETECTED NOT DETECTED Final   Streptococcus species NOT DETECTED NOT DETECTED Final   Streptococcus agalactiae NOT DETECTED NOT DETECTED Final   Streptococcus pneumoniae NOT DETECTED NOT DETECTED Final   Streptococcus pyogenes NOT DETECTED NOT DETECTED Final   A.calcoaceticus-baumannii NOT DETECTED NOT DETECTED Final   Bacteroides fragilis NOT DETECTED NOT DETECTED Final   Enterobacterales NOT DETECTED NOT DETECTED Final   Enterobacter cloacae complex NOT DETECTED NOT DETECTED Final   Escherichia coli NOT DETECTED NOT DETECTED Final   Klebsiella aerogenes NOT DETECTED NOT DETECTED Final   Klebsiella oxytoca NOT DETECTED NOT DETECTED Final   Klebsiella pneumoniae NOT DETECTED NOT DETECTED Final   Proteus species NOT DETECTED NOT DETECTED Final   Salmonella species NOT DETECTED NOT DETECTED Final   Serratia marcescens NOT DETECTED NOT DETECTED Final   Haemophilus influenzae NOT DETECTED NOT DETECTED Final   Neisseria meningitidis NOT  DETECTED NOT DETECTED Final   Pseudomonas aeruginosa NOT DETECTED NOT DETECTED Final   Stenotrophomonas maltophilia NOT DETECTED NOT DETECTED Final   Candida albicans NOT DETECTED NOT DETECTED Final   Candida auris NOT DETECTED NOT DETECTED Final   Candida glabrata NOT DETECTED NOT DETECTED Final   Candida krusei NOT DETECTED NOT DETECTED Final   Candida parapsilosis NOT DETECTED  NOT DETECTED Final   Candida tropicalis NOT DETECTED NOT DETECTED Final   Cryptococcus neoformans/gattii NOT DETECTED NOT DETECTED Final    Comment: Performed at [facility], [place] [address], [place], [place] [address]  Blood Culture (routine x 2)     Status: None (Preliminary result)   Collection Time: 03/28/22  3:54 PM   Specimen: BLOOD RIGHT HAND  Result Value Ref Range Status   Specimen Description   Final    BLOOD RIGHT HAND BOTTLES DRAWN AEROBIC AND ANAEROBIC   Special Requests Blood Culture adequate volume  Final   Culture   Final    NO GROWTH 4 DAYS Performed at [facility], [address], [place], [place] [address]    Report Status PENDING  Incomplete  Resp panel by RT-PCR (RSV, Flu A&B, Covid) Anterior Nasal Swab     Status: None   Collection Time: 03/28/22  4:22 PM   Specimen: Anterior Nasal Swab  Result Value Ref Range Status   SARS Coronavirus 2 by RT PCR NEGATIVE NEGATIVE Final    Comment: (NOTE) SARS-CoV-2 target nucleic acids are NOT DETECTED.  The SARS-CoV-2 RNA is generally detectable in upper respiratory specimens during the acute phase of infection. The lowest concentration of SARS-CoV-2 viral copies this assay can detect is 138 copies/mL. A negative result does not preclude SARS-Cov-2 infection and should not be used as the sole basis for treatment or other patient management decisions. A negative result may occur with  improper specimen collection/handling, submission of specimen other than nasopharyngeal swab, presence of viral mutation(s) within the areas targeted by this assay, and inadequate number of viral copies(<138 copies/mL). A negative result must be combined with clinical observations, patient history, and epidemiological information. The expected result is Negative.  Fact Sheet for Patients:  EntrepreneurPulse.com.au  Fact Sheet for Healthcare Providers:  IncredibleEmployment.be  This test is no t  yet approved or cleared by the [place] FDA and  has been authorized for detection and/or diagnosis of SARS-CoV-2 by FDA under an Emergency Use Authorization (EUA). This EUA will remain  in effect (meaning this test can be used) for the duration of the COVID-19 declaration under Section 564(b)(1) of the Act, 21 U.S.C.section 360bbb-3(b)(1), unless the authorization is terminated  or revoked sooner.       Influenza A by PCR NEGATIVE NEGATIVE Final   Influenza B by PCR NEGATIVE NEGATIVE Final    Comment: (NOTE) The Xpert Xpress SARS-CoV-2/FLU/RSV plus assay is intended as an aid in the diagnosis of influenza from Nasopharyngeal swab specimens and should not be used as a sole basis for treatment. Nasal washings and aspirates are unacceptable for Xpert Xpress SARS-CoV-2/FLU/RSV testing.  Fact Sheet for Patients: EntrepreneurPulse.com.au  Fact Sheet for Healthcare Providers: IncredibleEmployment.be  This test is not yet approved or cleared by the [place] FDA and has been authorized for detection and/or diagnosis of SARS-CoV-2 by FDA under an Emergency Use Authorization (EUA). This EUA will remain in effect (meaning this test can be used) for the duration of the COVID-19 declaration under Section 564(b)(1) of the Act, 21 U.S.C. section 360bbb-3(b)(1), unless the  authorization is terminated or revoked.     Resp Syncytial Virus by PCR NEGATIVE NEGATIVE Final    Comment: (NOTE) Fact Sheet for Patients: EntrepreneurPulse.com.au  Fact Sheet for Healthcare Providers: IncredibleEmployment.be  This test is not yet approved or cleared by the [place] FDA and has been authorized for detection and/or diagnosis of SARS-CoV-2 by FDA under an Emergency Use Authorization (EUA). This EUA will remain in effect (meaning this test can be used) for the duration of the COVID-19 declaration under Section 564(b)(1)  of the Act, 21 U.S.C. section 360bbb-3(b)(1), unless the authorization is terminated or revoked.  Performed at [facility], [address], [place], [place] [address]   Urine Culture     Status: None   Collection Time: 03/28/22  4:22 PM   Specimen: Urine, Catheterized  Result Value Ref Range Status   Specimen Description   Final    URINE, CATHETERIZED Performed at [facility], [address], [place], [place] [address]    Special Requests   Final    NONE Performed at [facility], [address] [place], [place] [address]    Culture   Final    NO GROWTH Performed at [facility], [place] [address], [place], [place] [address]    Report Status 03/30/2022 FINAL  Final  Urine Culture (for pregnant, neutropenic or urologic patients or patients with an indwelling urinary catheter)     Status: None   Collection Time: 03/28/22  9:09 PM   Specimen: Urine, Clean Catch  Result Value Ref Range Status   Specimen Description URINE, CLEAN CATCH  Final   Special Requests NONE  Final   Culture   Final    NO GROWTH Performed at [facility], [place] [address], [place], [place] [address]    Report Status 03/30/2022 FINAL  Final  Culture, Respiratory w Gram Stain     Status: None   Collection Time: 03/28/22  9:09 PM   Specimen: Tracheal Aspirate; Respiratory  Result Value Ref Range Status   Specimen Description TRACHEAL ASPIRATE  Final   Special Requests NONE  Final   Gram Stain   Final    RARE WBC PRESENT, PREDOMINANTLY PMN FEW GRAM POSITIVE COCCI IN PAIRS Performed at [facility], [place] [address] [place], [place] [address]    Culture MODERATE STREPTOCOCCUS PNEUMONIAE  Final   Report Status 04/02/2022 FINAL  Final   Organism ID, Bacteria STREPTOCOCCUS PNEUMONIAE  Final      Susceptibility   Streptococcus pneumoniae - MIC*    ERYTHROMYCIN <=0.12 SENSITIVE Sensitive     LEVOFLOXACIN 0.5 SENSITIVE Sensitive     VANCOMYCIN 0.5 SENSITIVE Sensitive     PENICILLIN (meningitis)  <=0.06 SENSITIVE Sensitive     PENO - penicillin <=0.06      PENICILLIN (non-meningitis) <=0.06 SENSITIVE Sensitive     PENICILLIN (oral) <=0.06 SENSITIVE Sensitive     CEFTRIAXONE (non-meningitis) <=0.12 SENSITIVE Sensitive     CEFTRIAXONE (meningitis) <=0.12 SENSITIVE Sensitive     * MODERATE STREPTOCOCCUS PNEUMONIAE  Respiratory (~20 pathogens) panel by PCR     Status: Abnormal   Collection Time: 03/28/22  9:09 PM   Specimen: Tracheal Aspirate; Respiratory  Result Value Ref Range Status   Adenovirus NOT DETECTED NOT DETECTED Final   Coronavirus 229E NOT DETECTED NOT DETECTED Final    Comment: (NOTE) The Coronavirus on the Respiratory Panel, DOES NOT test for the novel  Coronavirus (2019 nCoV)    Coronavirus HKU1 NOT DETECTED NOT DETECTED  Final   Coronavirus NL63 NOT DETECTED NOT DETECTED Final   Coronavirus OC43 DETECTED (A) NOT DETECTED Final   Metapneumovirus NOT DETECTED NOT DETECTED Final   Rhinovirus / Enterovirus NOT DETECTED NOT DETECTED Final   Influenza A NOT DETECTED NOT DETECTED Final   Influenza B NOT DETECTED NOT DETECTED Final   Parainfluenza Virus 1 NOT DETECTED NOT DETECTED Final   Parainfluenza Virus 2 NOT DETECTED NOT DETECTED Final   Parainfluenza Virus 3 NOT DETECTED NOT DETECTED Final   Parainfluenza Virus 4 NOT DETECTED NOT DETECTED Final   Respiratory Syncytial Virus NOT DETECTED NOT DETECTED Final   Bordetella pertussis NOT DETECTED NOT DETECTED Final   Bordetella Parapertussis NOT DETECTED NOT DETECTED Final   Chlamydophila pneumoniae NOT DETECTED NOT DETECTED Final   Mycoplasma pneumoniae NOT DETECTED NOT DETECTED Final    Comment: Performed at [facility], [place] [address], [place], [place] [address]  MRSA Next Gen by PCR, Nasal     Status: Abnormal   Collection Time: 03/28/22  9:09 PM   Specimen: Nasal Mucosa; Nasal Swab  Result Value Ref Range Status   MRSA by PCR Next Gen DETECTED (A) NOT DETECTED Final    Comment: RESULT CALLED TO, READ  BACK BY AND VERIFIED WITH: SAVAGE RN 03/28/22 @ 1027 BY AB (NOTE) The GeneXpert MRSA Assay (FDA approved for NASAL specimens only), is one component of a comprehensive MRSA colonization surveillance program. It is not intended to diagnose MRSA infection nor to guide or monitor treatment for MRSA infections. Test performance is not FDA approved in patients less than 70 years old. Performed at [facility], [place] [address], [place], [place] [address]     RADIOLOGY STUDIES/RESULTS: DG CHEST PORT 1 VIEW  Result Date: 04/01/2022 CLINICAL DATA:  Respiratory distress EXAM: PORTABLE CHEST 1 VIEW COMPARISON:  03/28/2022 FINDINGS: Interval extubation. There is narrowing of the subglottic airway within the transverse dimension which may relate to subglottic stenosis or edema. Pulmonary insufflation has been preserved. Diffuse interstitial pulmonary infiltrates persist but are improved from prior examination, likely infectious or inflammatory. No pneumothorax or pleural effusion. Transcatheter aortic valve replacement and coronary artery bypass grafting has been performed. Mild cardiomegaly is stable. Pulmonary vascularity is normal. No acute bone abnormality. IMPRESSION: 1. Interval extubation.  Preserved pulmonary insufflation. 2. Persistent but improved diffuse interstitial pulmonary infiltrates, likely infectious or inflammatory. 3. Narrowing of the subglottic airway may relate to subglottic stenosis or edema. Correlation with direct visualization may be helpful. Electronically Signed   By: Fidela Salisbury M.D.   On: 04/01/2022 22:46   DG Swallowing Func-Speech Pathology  Result Date: 03/31/2022 Table formatting from the original result was not included. Modified Barium Swallow Study Patient Details Name: VONNETTA AKEY MRN: [id number] Date of Birth: March 04, 1955 Today's Date: 03/31/2022 HPI/PMH: HPI: 67 yo F  admitted to [facility] ED 2/11 with respiratory failure.  Pt intubated 2/11 and tx'd to [facility].  Extubated  2/12. CXR showing multifocal opacities concerning for multifocal pneumonia.  PMH of COPD, Asthma, CAD, GERD. CVA, lung cancer, aortic stenosis s/p TAVR. Followed by SLP during Nov 2019 admission, requiring multiple MBS studies due to impaired swallowing, which improved prior to D/C. Clinical Impression: Clinical Impression: Patient presents with a mild pharyngeal phase dysphagia as per this MBS. Swallow was initiated at level of pyriform sinus for thin and nectar thick liquids and vallecular sinus for puree, mechanical soft solids and honey thick liquids. Trace amount of thin liquid and nectar thick liquid barium  penetrated in the laryngeal vestibule during the swallow but observed to exit fully. Penetration was above the level of the vocal cords. Only a trace amount of vallecular sinus residuals remained post initial swallow with nectar thick and thin liquids. Patient was unable to transit 76mm barium tablet from anterior to posterior portion of oral cavity. Straw used as a compesatory strategy and no significant difference in swallow function observed with straw as compared to consecutive cup sips. SLP recommending Dys 3 (mechanical soft) solids and thin liquids. Factors that may increase risk of adverse event in presence of aspiration ([place] 2021): No data recorded Recommendations/Plan: Swallowing Evaluation Recommendations Swallowing Evaluation Recommendations Recommendations: PO diet PO Diet Recommendation: Dysphagia 3 (Mechanical soft); Thin liquids (Level 0) Liquid Administration via: Cup; Straw Medication Administration: Whole meds with puree Supervision: Patient able to self-feed; Intermittent supervision/cueing for swallowing strategies Swallowing strategies  : Slow rate; Small bites/sips Postural changes: Position pt fully upright for meals; Stay upright 30-60 min after meals Oral care recommendations: Oral care BID (2x/day) Treatment Plan Treatment Plan Treatment recommendations: Therapy as  outlined in treatment plan below Follow-up recommendations: No SLP follow up Functional status assessment: Patient has had a recent decline in their functional status and demonstrates the ability to make significant improvements in function in a reasonable and predictable amount of time. Treatment frequency: Min 2x/week Treatment duration: 2 weeks Interventions: Aspiration precaution training; Compensatory techniques; Patient/family education; Diet toleration management by SLP Recommendations Recommendations for follow up therapy are one component of a multi-disciplinary discharge planning process, led by the attending physician.  Recommendations may be updated based on patient status, additional functional criteria and insurance authorization. Assessment: Orofacial Exam: Orofacial Exam Oral Cavity: Oral Hygiene: WFL Oral Cavity - Dentition: Edentulous; Other (Comment) Orofacial Anatomy: WFL Oral Motor/Sensory Function: WFL Anatomy: Anatomy: WFL Thin Liquids: Thin Liquids (Level 0) Thin Liquids : Impaired Bolus delivery method: Spoon; Cup; Straw Thin Liquid - Impairment: Pharyngeal impairment Initiation of swallow : Pyriform sinuses Soft palate elevation: No bolus between soft palate (SP)/pharyngeal wall (PW) Laryngeal elevation: Complete superior movement of thyroid cartilage with complete approximation of arytenoids to epiglottic petiole Anterior hyoid excursion: Partial Epiglottic movement: Complete Laryngeal vestibule closure: Incomplete, narrow column air/contrast in laryngeal vestibule Pharyngeal stripping wave : Present - complete Pharyngoesophageal segment opening: Complete distension and complete duration, no obstruction of flow Tongue base retraction: No contrast between tongue base and posterior pharyngeal wall (PPW); Trace column of contrast or air between tongue base and PPW Pharyngeal residue: Trace residue within or on pharyngeal structures Location of pharyngeal residue: Valleculae  Penetration/Aspiration Scale (PAS) score: 2.  Material enters airway, remains ABOVE vocal cords then ejected out  Mildly Thick Liquids: Mildly thick liquids (Level 2, nectar thick) Mildly thick liquids (Level 2, nectar thick): Impaired Bolus delivery method: Cup; Spoon Mildly Thick Liquid - Impairment: Pharyngeal impairment Laryngeal vestibule closure: Incomplete, narrow column air/contrast in laryngeal vestibule Pharyngeal residue: Trace residue within or on pharyngeal structures Location of pharyngeal residue: Valleculae; Pyriform sinuses Penetration/Aspiration Scale (PAS) score: 2.  Material enters airway, remains ABOVE vocal cords then ejected out  Moderately Thick Liquids: Moderately thick liquids (Level 3, honey thick) Moderately thick liquids (Level 3, honey thick): Impaired Bolus delivery method: Spoon Moderately Thick Liquid - Impairment: Pharyngeal impairment Pharyngeal residue: Trace residue within or on pharyngeal structures Location of pharyngeal residue: Valleculae Penetration/Aspiration Scale (PAS) score: 1.  Material does not enter airway  Puree: Puree Puree: WFL Solid: Solid Solid: Impaired Solid - Impairment: Oral Impairment  Bolus preparation/mastication: Slow prolonged chewing/mashing with complete recollection Bolus transport/lingual motion: Slow tongue motion Pill: Pill Pill: Impaired Consistency administered : thin Pill - Impairment: Oral Impairment Bolus transport/lingual motion: Slow tongue motion Oral residue: Majority of bolus remaining Location of oral residue : Tongue Compensatory Strategies: Compensatory Strategies Compensatory strategies: Yes Straw: Ineffective   General Information: Caregiver present: No  Diet Prior to this Study: Regular; Mildly thick liquids (Level 2, nectar thick)   Temperature : Normal   Respiratory Status: WFL   Supplemental O2: Nasal cannula   History of Recent Intubation: No  Behavior/Cognition: Alert; Pleasant mood; Cooperative Self-Feeding Abilities: Able to  self-feed Baseline vocal quality/speech: Normal Volitional Cough: Able to elicit Volitional Swallow: Able to elicit No data recorded Goal Planning: Prognosis for improved oropharyngeal function: Good No data recorded No data recorded No data recorded Consulted and agree with results and recommendations: Patient; Nurse Pain: Pain Assessment Pain Assessment: No/denies pain Faces Pain Scale: 4 Facial Expression: 0 Body Movements: 0 Muscle Tension: 0 Compliance with ventilator (intubated pts.): N/A Vocalization (extubated pts.): 0 CPOT Total: 0 Pain Location: generalized Pain Descriptors / Indicators: Grimacing Pain Intervention(s): Monitored during session; Limited activity within patient's tolerance; Repositioned End of Session: Start Time:SLP Start Time (ACUTE ONLY): 1345 Stop Time: SLP Stop Time (ACUTE ONLY): 1405 Time Calculation:SLP Time Calculation (min) (ACUTE ONLY): 20 min Charges: SLP Evaluations $ SLP Speech Visit: 1 Visit SLP Evaluations $BSS Swallow: 1 Procedure $MBS Swallow: 1 Procedure $Swallowing Treatment: 1 Procedure SLP visit diagnosis: SLP Visit Diagnosis: Dysphagia, oropharyngeal phase (R13.12) Past Medical History: Past Medical History: Diagnosis Date  Anxiety   Asthma   COPD (chronic obstructive pulmonary disease) (HCC)   Coronary artery disease   a. s/p NSTEMI in 11/2016 and required CABG with LIMA-LAD, SVG-LCx and SVG-D1 and complicated by cardiogenic shock b. cath in 05/2020 showing severe RCA stenosis and treated with orbital atherectomy and stent placement  GERD (gastroesophageal reflux disease)   History of CVA (cerebrovascular accident)   History of lung cancer 2020  Peripheral vascular disease (HCC)   S/P TAVR (transcatheter aortic valve replacement) 08/12/2020  s/p TAVR with a 23 mm Edwards S3U via the TF approach by Dr. Angelena Form & Dr. Cyndia Bent  Severe aortic stenosis  Past Surgical History: Past Surgical History: Procedure Laterality Date  AORTIC ARCH ANGIOGRAPHY N/A 11/04/2017  Procedure:  AORTIC ARCH ANGIOGRAPHY;  Surgeon: Elam Dutch, MD;  Location: [place] [facility];  Service: Cardiovascular;  Laterality: N/A;  APPLICATION OF WOUND VAC N/A 85/46/2703  Procedure: APPLICATION OF WOUND VAC;  Surgeon: Ivin Poot, MD;  Location: [place];  Service: Thoracic;  Laterality: N/A;  APPLICATION OF WOUND VAC N/A 01/13/2018  Procedure: WOUND VAC CHANGE;  Surgeon: Ivin Poot, MD;  Location: [place];  Service: Thoracic;  Laterality: N/A;  CARDIAC CATHETERIZATION    CAROTID-SUBCLAVIAN BYPASS GRAFT Right 12/26/2017  Procedure: AORTIC TO RIGHT COMMON CAROTID AND RIGHT SUBCLAVIAN  ARTERY  BYPASS;  Surgeon: Elam Dutch, MD;  Location: [place];  Service: Vascular;  Laterality: Right;  CHOLECYSTECTOMY    CLOSURE OF DIAPHRAGM  12/26/2017  Procedure: REPAIR OF DIAPHRAGM;  Surgeon: Ivin Poot, MD;  Location: [place];  Service: Thoracic;;  COLONOSCOPY N/A 10/03/2014  Procedure: COLONOSCOPY;  Surgeon: Rogene Houston, MD;  [facility];  Service: Endoscopy;  Laterality: N/A;  730  CORONARY ARTERY BYPASS GRAFT N/A 12/14/2016  Procedure: CORONARY ARTERY BYPASS GRAFTING (CABG) x three , using left internal mammary artery and right leg  greater saphenous vein harvested endoscopically;  Surgeon: Ivin Poot, MD;  Location: [place];  Service: Open Heart Surgery;  Laterality: N/A;  CORONARY ATHERECTOMY N/A 06/11/2020  Procedure: CORONARY ATHERECTOMY;  Surgeon: Burnell Blanks, MD;  Location: [place] [facility];  Service: Cardiovascular;  Laterality: N/A;  CORONARY STENT INTERVENTION N/A 06/11/2020  Procedure: CORONARY STENT INTERVENTION;  Surgeon: Burnell Blanks, MD;  Location: [place] [facility];  Service: Cardiovascular;  Laterality: N/A;  ENDARTERECTOMY Right 12/06/2014  Procedure: ENDARTERECTOMY CAROTid;  Surgeon: Elam Dutch, MD;  Location: [facility];  Service: Vascular;  Laterality: Right;  ESOPHAGOGASTRODUODENOSCOPY N/A 12/03/2016  Procedure: ESOPHAGOGASTRODUODENOSCOPY (EGD);   Surgeon: Rogene Houston, MD;  [facility];  Service: Endoscopy;  Laterality: N/A;  7:30  INTRAVASCULAR IMAGING/OCT N/A 06/11/2020  Procedure: INTRAVASCULAR IMAGING/OCT;  Surgeon: Burnell Blanks, MD;  Location: [place] [facility];  Service: Cardiovascular;  Laterality: N/A;  INTRAVASCULAR PRESSURE WIRE/FFR STUDY N/A 06/04/2020  Procedure: INTRAVASCULAR PRESSURE WIRE/FFR STUDY;  Surgeon: Burnell Blanks, MD;  Location: [place] [facility];  Service: Cardiovascular;  Laterality: N/A;  LEFT HEART CATH AND CORONARY ANGIOGRAPHY N/A 12/14/2016  Procedure: LEFT HEART CATH AND CORONARY ANGIOGRAPHY;  Surgeon: Burnell Blanks, MD;  Location: [place] [facility];  Service: Cardiovascular;  Laterality: N/A;  LOBECTOMY Right 10/26/2018  Procedure: RIGHT UPPER LOBECTOMY;  Surgeon: Lajuana Matte, MD;  Location: [place];  Service: Thoracic;  Laterality: Right;  PERIPHERAL VASCULAR CATHETERIZATION N/A 12/05/2014  Procedure:  Carotid Arch Angiography;  Surgeon: Conrad Sutcliffe, MD;  Location: [place] [facility];  Service: Cardiovascular;  Laterality: N/A;  RIGHT/LEFT HEART CATH AND CORONARY/GRAFT ANGIOGRAPHY N/A 06/04/2020  Procedure: RIGHT/LEFT HEART CATH AND CORONARY/GRAFT ANGIOGRAPHY;  Surgeon: Burnell Blanks, MD;  Location: [place] [facility];  Service: Cardiovascular;  Laterality: N/A;  STERNAL WOUND DEBRIDEMENT N/A 01/06/2018  Procedure: STERNAL WOUND DEBRIDEMENT;  Surgeon: Ivin Poot, MD;  Location: [place];  Service: Thoracic;  Laterality: N/A;  STERNOTOMY N/A 12/26/2017  Procedure: REDO STERNOTOMY;  Surgeon: Prescott Gum, Collier Salina, MD;  Location: [place];  Service: Thoracic;  Laterality: N/A;  TEE WITHOUT CARDIOVERSION N/A 12/14/2016  Procedure: TRANSESOPHAGEAL ECHOCARDIOGRAM (TEE);  Surgeon: Prescott Gum, Collier Salina, MD;  Location: [place];  Service: Open Heart Surgery;  Laterality: N/A;  TEE WITHOUT CARDIOVERSION N/A 08/12/2020  Procedure: TRANSESOPHAGEAL ECHOCARDIOGRAM (TEE);  Surgeon:  Burnell Blanks, MD;  Location: [place] [facility];  Service: Open Heart Surgery;  Laterality: N/A;  TRANSCATHETER AORTIC VALVE REPLACEMENT, TRANSFEMORAL N/A 08/12/2020  Procedure: TRANSCATHETER AORTIC VALVE REPLACEMENT, TRANSFEMORAL;  Surgeon: Burnell Blanks, MD;  Location: [place] [facility];  Service: Open Heart Surgery;  Laterality: N/A;  VIDEO ASSISTED THORACOSCOPY (VATS)/WEDGE RESECTION Right 10/26/2018  Procedure: VIDEO ASSISTED THORACOSCOPY (VATS)/WEDGE RESECTION;  Surgeon: Lajuana Matte, MD;  Location: [place];  Service: Thoracic;  Laterality: Right;  VIDEO BRONCHOSCOPY N/A 10/26/2018  Procedure: VIDEO BRONCHOSCOPY;  Surgeon: Lajuana Matte, MD;  Location: [place];  Service: Thoracic;  Laterality: N/A; Sonia Baller, MA, CCC-SLP Speech Therapy     LOS: 5 days   Oren Binet, MD  [facility]    To contact the attending provider between 7A-7P or the covering provider during after hours 7P-7A, please log into the web site [email or web address] and access using universal [place] password for that web site. If you do not have the password, please call the hospital operator.  04/02/2022, 12:07 PM

## 2022-04-02 NOTE — Progress Notes (Signed)
Occupational Therapy Treatment Patient Details Name: Emily Rocha MRN: [id number] DOB: 12/25/1955 Today's Date: 04/02/2022   History of present illness 67 yo female presents to ED on 2/11 found on floor and unresponsive. Initially presented to [facility], transferred to [facility]. Workup for acute respiratory failure with hypercarbia, COPD exacerbation. ETT 2/11-12. PMH of COPD, Asthma, CAD, CVA, lung cancer s/p lobectomy, aortic stenosis s/p TAVR.   OT comments  Pt making steady progress towards OT goals this session. Pt continues to present with decreased activity tolerance, generalized weakness and impaired safety awareness . Pt greeted supine in bed noted to be soiled in urine/stool with no awareness. Pt completed functional mobility to bathroom with no AD and min guard assist, pt with additional + BM needing up to MAX A for hygiene as pt attempting to wipe with dirty wash cloths. Pt currently requires [place] for UB ADLS and up to [place] for LB ADLS. Pt on 3L during session with SpO2 > 93%. Pt would continue to benefit from skilled occupational therapy while admitted and after d/c to address the below listed limitations in order to improve overall functional mobility and facilitate independence with BADL participation. DC plan remains appropriate, will follow acutely per POC.      Recommendations for follow up therapy are one component of a multi-disciplinary discharge planning process, led by the attending physician.  Recommendations may be updated based on patient status, additional functional criteria and insurance authorization.    Follow Up Recommendations  Skilled nursing-short term rehab (<3 hours/day)     Assistance Recommended at Discharge Frequent or constant Supervision/Assistance  Patient can return home with the following  A little help with walking and/or transfers;A lot of help with bathing/dressing/bathroom;Assistance with cooking/housework;Assistance with feeding;Direct supervision/assist for  medications management;Direct supervision/assist for financial management;Assist for transportation;Help with stairs or ramp for entrance   Equipment Recommendations  None recommended by OT (defer)    Recommendations for Other Services      Precautions / Restrictions Precautions Precautions: Fall Precaution Comments: 3L O2 baseline Restrictions Weight Bearing Restrictions: No       Mobility Bed Mobility Overal bed mobility: Needs Assistance Bed Mobility: Supine to Sit     Supine to sit: Min guard, HOB elevated     General bed mobility comments: min guard for safety and line mgmt    Transfers Overall transfer level: Needs assistance Equipment used: None Transfers: Sit to/from Stand Sit to Stand: Min guard, Min assist           General transfer comment: min guard to rise from EOB, MIN A to rise from low toilet with use of grab bars     Balance Overall balance assessment: Needs assistance Sitting-balance support: Feet supported, No upper extremity supported Sitting balance-Leahy Scale: Fair     Standing balance support: Single extremity supported, During functional activity Standing balance-Leahy Scale: Poor Standing balance comment: at least one UE support during ADls at sink                           ADL either performed or assessed with clinical judgement   ADL Overall ADL's : Needs assistance/impaired     Grooming: Wash/dry hands;Standing;Supervision/safety       Lower Body Bathing: Sit to/from stand;Maximal assistance;Supervison/ safety Lower Body Bathing Details (indicate cue type and reason): pt able to access LB for bathing however needed up to MAX A d/t cleanliness Upper Body Dressing : Minimal assistance;Sitting Upper Body Dressing  Details (indicate cue type and reason): to don new gown     Toilet Transfer: Min guard;Ambulation;Regular Toilet;Cueing for safety;Cueing for sequencing;Grab bars Toilet Transfer Details (indicate cue  type and reason): min guard with no AD, min guard for safety and line mgmt Toileting- Clothing Manipulation and Hygiene: Supervision/safety;Maximal assistance Toileting - Clothing Manipulation Details (indicate cue type and reason): able to access LB but needed up to MAX A for cleanliness     Functional mobility during ADLs: Min guard;Cueing for safety;Cueing for sequencing General ADL Comments: ADL participation impacted  by impaired safety awareness and decreased activity tolerance    Extremity/Trunk Assessment Upper Extremity Assessment Upper Extremity Assessment: Overall WFL for tasks assessed   Lower Extremity Assessment Lower Extremity Assessment: Defer to PT evaluation        Vision Baseline Vision/History: 1 Wears glasses Patient Visual Report: No change from baseline (per gross assessment, will continue to assess)     Perception Perception Perception: Not tested   Praxis Praxis Praxis: Not tested    Cognition Arousal/Alertness: Awake/alert Behavior During Therapy: Flat affect Overall Cognitive Status: Impaired/Different from baseline Area of Impairment: Attention, Memory, Following commands, Safety/judgement, Awareness, Problem solving                   Current Attention Level: Sustained Memory: Decreased recall of precautions, Decreased short-term memory Following Commands: Follows one step commands with increased time, Follows multi-step commands inconsistently Safety/Judgement: Decreased awareness of safety, Decreased awareness of deficits Awareness: Intellectual Problem Solving: Slow processing, Decreased initiation, Difficulty sequencing, Requires verbal cues General Comments: pt with impaired awareness noted to be soiled in stood with no awareness, trying to wipe bottom with soiled hand.        Exercises      Shoulder Instructions       General Comments pt on 3L  during session with Spo2 >93%, pts bed soiled in urine and stool, assisted pt  with cleaning and alerted nurse of needing new sheets, pt left in recliner    Pertinent Vitals/ Pain       Pain Assessment Pain Assessment: No/denies pain  Home Living                                          Prior Functioning/Environment              Frequency  Min 2X/week        Progress Toward Goals  OT Goals(current goals can now be found in the care plan section)  Progress towards OT goals: Progressing toward goals  Acute Rehab OT Goals Patient Stated Goal: none stated Time For Goal Achievement: 04/14/22 Potential to Achieve Goals: Good  Plan Discharge plan remains appropriate;Frequency remains appropriate    Co-evaluation                 AM-PAC OT "6 Clicks" Daily Activity     Outcome Measure   Help from another person eating meals?: None Help from another person taking care of personal grooming?: A Little Help from another person toileting, which includes using toliet, bedpan, or urinal?: A Little Help from another person bathing (including washing, rinsing, drying)?: A Lot Help from another person to put on and taking off regular upper body clothing?: A Little Help from another person to put on and taking off regular lower body clothing?: A Lot 6 Click Score: 17  End of Session Equipment Utilized During Treatment: Gait belt;Oxygen;Other (comment) (3L [place])  OT Visit Diagnosis: Unsteadiness on feet (R26.81);Other abnormalities of gait and mobility (R26.89);Muscle weakness (generalized) (M62.81)   Activity Tolerance Patient tolerated treatment well   Patient Left in chair;with call bell/phone within reach;with chair alarm set   Nurse Communication Mobility status;Other (comment) (via secure chat)        Time: 3276-1470 OT Time Calculation (min): 33 min  Charges: OT General Charges $OT Visit: 1 Visit OT Treatments $Self Care/Home Management : 23-37 mins  Harley Alto., COTA/L Acute Rehabilitation  Services [phone]   Emily Rocha 04/02/2022, 2:50 PM

## 2022-04-02 NOTE — Progress Notes (Signed)
Physical Therapy Treatment Patient Details Name: Emily Rocha MRN: [id number] DOB: Feb 06, 1956 Today's Date: 04/02/2022   History of Present Illness 67 yo female presents to ED on 2/11 found on floor and unresponsive. Initially presented to [facility], transferred to [facility]. Workup for acute respiratory failure with hypercarbia, COPD exacerbation. ETT 2/11-12. PMH of COPD, Asthma, CAD, CVA, lung cancer s/p lobectomy, aortic stenosis s/p TAVR.    PT Comments    Pt tolerated today's session well, motivated to mobilize with therapy today. Pt continues to have cognitive deficits resulting in poor negotiation around obstacles in the hallway, constant cueing required. Pt required minA for sit<>stand transfer with minA and RW for ambulation, tolerating ~120 feet today with one brief standing rest break due to fatigue. SPO2 maintaining >88% on 3L O2 today. Of note pt with bowel incontinence with hallway ambulation, with no knowledge of it occurring. Pt will continue to benefit from skilled acute PT at this time to continue to progress mobility, current discharge plan remains appropriate.    Recommendations for follow up therapy are one component of a multi-disciplinary discharge planning process, led by the attending physician.  Recommendations may be updated based on patient status, additional functional criteria and insurance authorization.  Follow Up Recommendations  Skilled nursing-short term rehab (<3 hours/day) (hopeful pt can progress to [facility] level) Can patient physically be transported by private vehicle: Yes   Assistance Recommended at Discharge Frequent or constant Supervision/Assistance  Patient can return home with the following A little help with walking and/or transfers;A little help with bathing/dressing/bathroom;Assistance with cooking/housework;Direct supervision/assist for medications management;Direct supervision/assist for financial management;Assist for transportation;Help with stairs or ramp for  entrance   Equipment Recommendations  None recommended by PT    Recommendations for Other Services       Precautions / Restrictions Precautions Precautions: Fall Precaution Comments: 3L O2 baseline Restrictions Weight Bearing Restrictions: No     Mobility  Bed Mobility Overal bed mobility: Needs Assistance Bed Mobility: Supine to Sit, Sit to Supine     Supine to sit: Min guard, HOB elevated Sit to supine: Min assist, HOB elevated   General bed mobility comments: use of bed rails, minA for sit>supine for BLE management    Transfers Overall transfer level: Needs assistance Equipment used: Rolling walker (2 wheels) Transfers: Sit to/from Stand Sit to Stand: Min assist           General transfer comment: rise and steady assist, cueing for hand placement    Ambulation/Gait Ambulation/Gait assistance: Min assist Gait Distance (Feet): 120 Feet Assistive device: Rolling walker (2 wheels) Gait Pattern/deviations: Step-through pattern, Decreased stride length, Trunk flexed, Drifts right/left Gait velocity: decreased     General Gait Details: cues for room navigation, assist to steady and help pt avoid obstacles. Provided cues for maintaining straight path in the hallway as significant path deviation noted. SpO2 88% and greater on 3LO2   Stairs             Wheelchair Mobility    Modified Rankin (Stroke Patients Only)       Balance Overall balance assessment: Needs assistance Sitting-balance support: Feet supported Sitting balance-Leahy Scale: Fair Sitting balance - Comments: sitting unsupported briefly but upon return to room pt ready to return to supine   Standing balance support: During functional activity, Bilateral upper extremity supported Standing balance-Leahy Scale: Fair Standing balance comment: no LOB but pt with path deviation and needing cues for obstacles  Cognition Arousal/Alertness:  Awake/alert Behavior During Therapy: Flat affect Overall Cognitive Status: Impaired/Different from baseline Area of Impairment: Attention, Memory, Following commands, Safety/judgement, Awareness, Problem solving                   Current Attention Level: Sustained Memory: Decreased recall of precautions, Decreased short-term memory Following Commands: Follows one step commands with increased time Safety/Judgement: Decreased awareness of safety, Decreased awareness of deficits Awareness: Emergent Problem Solving: Slow processing, Decreased initiation, Difficulty sequencing, Requires verbal cues General Comments: pt lacks attention with gait and mobility, HOH making cueing difficult, constant redirection provided during ambulation, especially in the hallway with obstacles        Exercises      General Comments General comments (skin integrity, edema, etc.): SPO2 >88% on 3L O2 [place]      Pertinent Vitals/Pain Pain Assessment Pain Assessment: Faces Faces Pain Scale: No hurt    Home Living                          Prior Function            PT Goals (current goals can now be found in the care plan section) Acute Rehab PT Goals Patient Stated Goal: go home PT Goal Formulation: With patient Time For Goal Achievement: 04/14/22 Potential to Achieve Goals: Good Progress towards PT goals: Progressing toward goals    Frequency    Min 3X/week      PT Plan Current plan remains appropriate    Co-evaluation              AM-PAC PT "6 Clicks" Mobility   Outcome Measure  Help needed turning from your back to your side while in a flat bed without using bedrails?: A Little Help needed moving from lying on your back to sitting on the side of a flat bed without using bedrails?: A Little Help needed moving to and from a bed to a chair (including a wheelchair)?: A Little Help needed standing up from a chair using your arms (e.g., wheelchair or bedside chair)?: A  Little Help needed to walk in hospital room?: A Little Help needed climbing 3-5 steps with a railing? : A Lot 6 Click Score: 17    End of Session Equipment Utilized During Treatment: Oxygen Activity Tolerance: Patient tolerated treatment well Patient left: with call bell/phone within reach;in bed;with bed alarm set;with family/visitor present Nurse Communication: Mobility status PT Visit Diagnosis: Other abnormalities of gait and mobility (R26.89);Muscle weakness (generalized) (M62.81)     Time: 1761-6073 PT Time Calculation (min) (ACUTE ONLY): 19 min  Charges:  $Gait Training: 8-22 mins                     Charlynne Cousins, PT DPT Acute Rehabilitation Services Office [phone]    Luvenia Heller 04/02/2022, 12:51 PM

## 2022-04-03 DIAGNOSIS — A419 Sepsis, unspecified organism: Secondary | ICD-10-CM | POA: Diagnosis not present

## 2022-04-03 DIAGNOSIS — R7989 Other specified abnormal findings of blood chemistry: Secondary | ICD-10-CM | POA: Diagnosis not present

## 2022-04-03 DIAGNOSIS — J189 Pneumonia, unspecified organism: Secondary | ICD-10-CM | POA: Diagnosis not present

## 2022-04-03 DIAGNOSIS — J9602 Acute respiratory failure with hypercapnia: Secondary | ICD-10-CM | POA: Diagnosis not present

## 2022-04-03 LAB — PHOSPHORUS: Phosphorus: 2.9 mg/dL (ref 2.5–4.6)

## 2022-04-03 LAB — BASIC METABOLIC PANEL
BUN: 23 mg/dL (ref 8–23)
CO2: >45 mmol/L — ABNORMAL HIGH (ref 22–32)
Calcium: 8.7 mg/dL — ABNORMAL LOW (ref 8.9–10.3)
Chloride: 82 mmol/L — ABNORMAL LOW (ref 98–111)
Creatinine, Ser: 0.39 mg/dL — ABNORMAL LOW (ref 0.44–1.00)
GFR, Estimated: >60 mL/min (ref >60)
Glucose, Bld: 104 mg/dL — ABNORMAL HIGH (ref 70–99)
Potassium: 4.3 mmol/L (ref 3.5–5.1)
Sodium: 136 mmol/L (ref 135–145)

## 2022-04-03 LAB — CULTURE, BLOOD (ROUTINE X 2)
Culture: NO GROWTH
Special Requests: ADEQUATE

## 2022-04-03 LAB — GLUCOSE, CAPILLARY
Glucose-Capillary: 83 mg/dL (ref 70–99)
Glucose-Capillary: 147 mg/dL — ABNORMAL HIGH (ref 70–99)
Glucose-Capillary: 282 mg/dL — ABNORMAL HIGH (ref 70–99)
Glucose-Capillary: 91 mg/dL (ref 70–99)

## 2022-04-03 LAB — AMMONIA: Ammonia: 55 umol/L — ABNORMAL HIGH (ref 9–35)

## 2022-04-03 LAB — MAGNESIUM: Magnesium: 1.9 mg/dL (ref 1.7–2.4)

## 2022-04-03 MED ORDER — ACETAZOLAMIDE 250 MG PO TABS
250.0000 mg | ORAL_TABLET | Freq: Two times a day (BID) | ORAL | Status: DC
  Administered 2022-04-03 (×2): 250 mg via ORAL
  Filled 2022-04-03 (×4): qty 1

## 2022-04-03 NOTE — Progress Notes (Addendum)
PROGRESS NOTE        PATIENT DETAILS Name: Emily Rocha Age: 67 y.o. Sex: female Date of Birth: [birth year] Admit Date: 03/28/2022 Admitting Physician Collier Bullock, MD ZOX:WRUE, Costella Hatcher, MD  Brief Summary: Patient is a 67 y.o.  female w hx of COPD on Home O2, severe AS s/p TAVR, CAD-s/p CABG admitted via [place] ED to [facility] ICU for acute on chronic combined hypoxic and hypercarbic respiratory failure-requiring intubation.  Stabilized in the ICU-hospital course complicated by development of A-fib with RVR.  Transferred to [facility] on 2/15.  Significant events: 2/11>> admit to ICU by PCCM 2/12>> extubated 2/13>> episode of A-fib RVR 2/15>> transferred to [facility] an episode of shortness of breath-CXR with possible subglottic stenosis or edema-changed to IV steroids.  Significant studies: 2/11>> CT chest/abdomen/pelvis: Multifocal pneumonia 2/12>> CT head: No acute intracranial abnormality 2/13>> echo: EF 45-40%, RV systolic function moderately reduced.  Normal structure/functioning of aortic valve prosthesis 2/15>> CXR: Narrowing of the subglottic airway may reflect stenosis/edema  Significant microbiology data: 2/11>> COVID/influenza/RSV PCR: Negative 2/11>> respiratory virus panel: +ve Coronavirus OC43 2/11>> blood culture: 1/2 Staph hominis (contamination) 2/11>> tracheal aspirate: Streptococcus pneumoniae  Procedures: 2/11-2/12>> ETT  Consults: PCCM.  Subjective: No major issues overnight-no wheezing/stridor.  Objective: Vitals: Blood pressure (!) 112/49, pulse 64, temperature 97.6 F (36.4 C), temperature source Oral, resp. rate (!) 28, height 5\' 4"  (9.811 m), weight 63.9 kg, SpO2 98 %.   Exam: Gen Exam:Alert awake-not in any distress HEENT:atraumatic, normocephalic Chest: B/L clear to auscultation anteriorly CVS:S1S2 regular Abdomen:soft non tender, non distended Extremities:no edema Neurology: Non focal Skin: no rash  Pertinent  Labs/Radiology:    Latest Ref Rng & Units 04/02/2022   10:01 AM 03/31/2022   12:27 AM 03/30/2022    2:04 AM  CBC  WBC 4.0 - 10.5 K/uL 10.1  11.3  11.7   Hemoglobin 12.0 - 15.0 g/dL 12.7  11.8  11.9   Hematocrit 36.0 - 46.0 % 43.4  40.4  39.8   Platelets 150 - 400 K/uL 224  207  187     Lab Results  Component Value Date   NA 136 04/03/2022   K 4.3 04/03/2022   CL 82 (L) 04/03/2022   CO2 >45 (H) 04/03/2022      Assessment/Plan: Septic shock secondary to multifocal PNA with ARDS (coronavirus and streptococcal pneumonia on culture data) Sepsis physiology has resolved Culture data as above Completed Rocephin x 7 days total on 2/17  Acute on chronic hypoxic and hypercarbic respiratory failure Due to PNA/COPD exacerbation Intubated on 2/11-extubated on 2/12 Overall improved-stable on usual home regimen of 3-4 L Taper steroids over next few days Continue bronchodilators  Acute metabolic encephalopathy Due to hypercarbia Resolved-Completely awake/alert this morning  COPD exacerbation Improved Taper steroids over the next few days Continue bronchodilators  ?  Subglottic edema Developed SOB last night-CXR with narrowing of the subglottic airway She does not appear to have any stridor/wheezing-not symptomatic Could be related to recent intubation related inflammation-now on IV steroids Will CT neck today  A-fib RVR Occurred on 2/30 Back to sinus rhythm Echo as above Continue metoprolol Continue Eliquis  Elevated troponins likely due to demand ischemia No chest pain Echo with stable EF  CAD s/p CABG 2018 and PCI April 2022 Elevated troponins on admission likely due to demand ischemia Continue statin/beta-blocker-no longer on aspirin as  on Eliquis Will require cardiology follow-up at some point   History of severe aortic stenosis-s/p TAVR 2022 Echo shows normal structure/function of prosthetic valve  Chronic diastolic heart failure Volume status relatively  stable Change Demadex to diamox given bicarb >45  Mildly elevated ammonia levels Unclear etiology No history of cirrhosis Continue lactulose for a few more days  History of CVA Moving all 4 extremities Now on Eliquis  GERD PPI  History of T1 cN0 M0 adenocarcinoma the right upper lobe  s/p R VATS, RULectomy on 10/26/2018   Tobacco abuse Patient denies but per prior notes-ongoing use per husband Transdermal nicotine  Debility/deconditioning PT/OT eval ongoing-SNF recommended  BMI Estimated body mass index is 24.18 kg/m as calculated from the following:   Height as of this encounter: 5\' 4"  (1.626 m).   Weight as of this encounter: 63.9 kg.   Code status:   Code Status: Full Code   DVT Prophylaxis: SCDs Start: 03/28/22 2128 apixaban (ELIQUIS) tablet 5 mg    Family Communication: Spouse-Dexter Vanderwoude-[phone]-updated over the phone 2/17   Disposition Plan: Status is: Inpatient Remains inpatient appropriate because: Severity of illness   Planned Discharge Destination:Skilled nursing facility   Diet: Diet Order             Diet Heart Room service appropriate? Yes; Fluid consistency: Thin  Diet effective now                     Antimicrobial agents: Anti-infectives (From admission, onward)    Start     Dose/Rate Route Frequency Ordered Stop   04/02/22 1000  cefTRIAXone (ROCEPHIN) 2 g in sodium chloride 0.9 % 100 mL IVPB        2 g 200 mL/hr over 30 Minutes Intravenous Every 24 hours 04/01/22 1419 04/03/22 1029   03/30/22 1400  vancomycin (VANCOREADY) IVPB 750 mg/150 mL  Status:  Discontinued        750 mg 150 mL/hr over 60 Minutes Intravenous Every 12 hours 03/30/22 1014 03/31/22 1518   03/30/22 1000  cefTRIAXone (ROCEPHIN) 2 g in sodium chloride 0.9 % 100 mL IVPB  Status:  Discontinued        2 g 200 mL/hr over 30 Minutes Intravenous Every 24 hours 03/30/22 0837 04/01/22 1419   03/30/22 1000  azithromycin (ZITHROMAX) tablet 500 mg  Status:   Discontinued        500 mg Oral Daily 03/30/22 0842 03/31/22 0823   03/30/22 0200  vancomycin (VANCOREADY) IVPB 500 mg/100 mL  Status:  Discontinued        500 mg 100 mL/hr over 60 Minutes Intravenous Every 12 hours 03/29/22 1454 03/30/22 1014   03/29/22 1415  vancomycin (VANCOREADY) IVPB 1500 mg/300 mL        1,500 mg 150 mL/hr over 120 Minutes Intravenous  Once 03/29/22 1315 03/29/22 1544   03/28/22 1600  cefTRIAXone (ROCEPHIN) 2 g in sodium chloride 0.9 % 100 mL IVPB  Status:  Discontinued        2 g 200 mL/hr over 30 Minutes Intravenous Every 24 hours 03/28/22 1549 03/30/22 0813   03/28/22 1600  azithromycin (ZITHROMAX) 500 mg in sodium chloride 0.9 % 250 mL IVPB  Status:  Discontinued        500 mg 250 mL/hr over 60 Minutes Intravenous Every 24 hours 03/28/22 1549 03/30/22 0842        MEDICATIONS: Scheduled Meds:  apixaban  5 mg Oral BID   arformoterol  15 mcg Nebulization BID  aspirin  81 mg Oral Daily   budesonide (PULMICORT) nebulizer solution  0.5 mg Nebulization BID   Chlorhexidine Gluconate Cloth  6 each Topical Q0600   Chlorhexidine Gluconate Cloth  6 each Topical Q0600   guaiFENesin  600 mg Oral BID   insulin aspart  0-15 Units Subcutaneous TID WC   lactulose  20 g Oral BID   methylPREDNISolone (SOLU-MEDROL) injection  120 mg Intravenous Daily   metoprolol tartrate  37.5 mg Oral BID   mupirocin ointment  1 Application Nasal BID   nicotine  14 mg Transdermal Daily   pantoprazole  40 mg Oral Daily   polyethylene glycol  17 g Oral Daily   revefenacin  175 mcg Nebulization Daily   torsemide  40 mg Oral Daily   Continuous Infusions:  sodium chloride 10 mL/hr at 04/03/22 0529   sodium chloride Stopped (03/30/22 1303)   PRN Meds:.acetaminophen, albuterol, ALPRAZolam, metoprolol tartrate, ondansetron (ZOFRAN) IV, mouth rinse, polyethylene glycol   I have personally reviewed following labs and imaging studies  LABORATORY DATA: CBC: Recent Labs  Lab  03/28/22 1549 03/28/22 1551 03/28/22 2249 03/29/22 0542 03/30/22 0204 03/31/22 0027 04/02/22 1001  WBC 20.7*  --   --  10.6* 11.7* 11.3* 10.1  NEUTROABS 17.6*  --   --   --   --   --   --   HGB 13.2   < > 11.6* 11.2* 11.9* 11.8* 12.7  HCT 44.8   < > 34.0* 37.6 39.8 40.4 43.4  MCV 90.5  --   --  87.9 89.4 91.0 89.3  PLT 199  --   --  152 187 207 224   < > = values in this interval not displayed.     Basic Metabolic Panel: Recent Labs  Lab 03/29/22 0542 03/30/22 0204 03/31/22 0027 04/01/22 0233 04/02/22 1001 04/03/22 0412  NA 136 138 135 136 136 136  K 5.0 5.1 5.6* 4.3 4.8 4.3  CL 96* 99 96* 90* 86* 82*  CO2 26 29 30  36* 40* >45*  GLUCOSE 166* 134* 110* 102* 168* 104*  BUN 24* 35* 39* 33* 22 23  CREATININE 0.78 0.81 0.64 0.54 0.47 0.39*  CALCIUM 8.6* 8.7* 9.0 8.9 8.9 8.7*  MG 3.3* 3.5*  --  2.2 2.0 1.9  PHOS  --   --  3.1 2.5 3.7 2.9     GFR: Estimated Creatinine Clearance: 58.1 mL/min (A) (by C-G formula based on SCr of 0.39 mg/dL (L)).  Liver Function Tests: Recent Labs  Lab 03/28/22 1549 03/29/22 0542 03/31/22 0027 04/02/22 1001  AST 163* 78* 80* 36  ALT 166* 105* 101* 67*  ALKPHOS 147* 117 145* 107  BILITOT 1.9* 1.1 0.6 0.9  PROT 7.3 5.6* 6.6 6.1*  ALBUMIN 3.3* 2.3* 2.7* 2.8*    No results for input(s): "LIPASE", "AMYLASE" in the last 168 hours. Recent Labs  Lab 03/30/22 0204 03/31/22 0027 04/01/22 0233 04/02/22 1001 04/03/22 0412  AMMONIA 47* 71* 46* 53* 55*     Coagulation Profile: Recent Labs  Lab 03/28/22 1549  INR 1.2     Cardiac Enzymes: No results for input(s): "CKTOTAL", "CKMB", "CKMBINDEX", "TROPONINI" in the last 168 hours.  BNP (last 3 results) No results for input(s): "PROBNP" in the last 8760 hours.  Lipid Profile: No results for input(s): "CHOL", "HDL", "LDLCALC", "TRIG", "CHOLHDL", "LDLDIRECT" in the last 72 hours.  Thyroid Function Tests: No results for input(s): "TSH", "T4TOTAL", "FREET4", "T3FREE", "THYROIDAB"  in the last 72 hours.  Anemia  Panel: No results for input(s): "VITAMINB12", "FOLATE", "FERRITIN", "TIBC", "IRON", "RETICCTPCT" in the last 72 hours.  Urine analysis:    Component Value Date/Time   COLORURINE YELLOW 03/28/2022 1622   APPEARANCEUR CLEAR 03/28/2022 1622   LABSPEC 1.014 03/28/2022 1622   PHURINE 6.0 03/28/2022 1622   GLUCOSEU NEGATIVE 03/28/2022 1622   HGBUR NEGATIVE 03/28/2022 1622   BILIRUBINUR NEGATIVE 03/28/2022 1622   KETONESUR 5 (A) 03/28/2022 1622   PROTEINUR 30 (A) 03/28/2022 1622   UROBILINOGEN 0.2 12/04/2014 1737   NITRITE NEGATIVE 03/28/2022 1622   LEUKOCYTESUR NEGATIVE 03/28/2022 1622    Sepsis Labs: Lactic Acid, Venous    Component Value Date/Time   LATICACIDVEN 1.0 03/29/2022 0255    MICROBIOLOGY: Recent Results (from the past 240 hour(s))  Blood Culture (routine x 2)     Status: Abnormal   Collection Time: 03/28/22  3:49 PM   Specimen: BLOOD LEFT FOREARM  Result Value Ref Range Status   Specimen Description   Final    BLOOD LEFT FOREARM BOTTLES DRAWN AEROBIC AND ANAEROBIC Performed at [facility], [address] [place], [place] [address]    Special Requests   Final    Blood Culture adequate volume Performed at [facility], [address] [place], [place] [address]    Culture  Setup Time   Final    AEROBIC BOTTLE ONLY GRAM POSITIVE COCCI Gram Stain Report Called to,Read Back By and Verified With: PHYLLIS MCKINNEY @ 1517 ON 02/26/22 C VARNER CRITICAL RESULT CALLED TO, READ BACK BY AND VERIFIED WITH:  C/ PHARMD T. DANG 03/29/22 1719 A. LAFRANCE    Culture (A)  Final    STAPHYLOCOCCUS HOMINIS THE SIGNIFICANCE OF ISOLATING THIS ORGANISM FROM A SINGLE SET OF BLOOD CULTURES WHEN MULTIPLE SETS ARE DRAWN IS UNCERTAIN. PLEASE NOTIFY THE MICROBIOLOGY DEPARTMENT WITHIN ONE WEEK IF SPECIATION AND SENSITIVITIES ARE REQUIRED. Performed at [facility], [place] [address] [place], [place] [address]    Report Status 03/31/2022 FINAL  Final   Blood Culture ID Panel (Reflexed)     Status: Abnormal   Collection Time: 03/28/22  3:49 PM  Result Value Ref Range Status   Enterococcus faecalis NOT DETECTED NOT DETECTED Final   Enterococcus Faecium NOT DETECTED NOT DETECTED Final   Listeria monocytogenes NOT DETECTED NOT DETECTED Final   Staphylococcus species DETECTED (A) NOT DETECTED Final    Comment: CRITICAL RESULT CALLED TO, READ BACK BY AND VERIFIED WITH:  C/ PHARMD T. DANG 03/29/22 1719 A. LAFRANCE    Staphylococcus aureus (BCID) NOT DETECTED NOT DETECTED Final   Staphylococcus epidermidis NOT DETECTED NOT DETECTED Final   Staphylococcus lugdunensis NOT DETECTED NOT DETECTED Final   Streptococcus species NOT DETECTED NOT DETECTED Final   Streptococcus agalactiae NOT DETECTED NOT DETECTED Final   Streptococcus pneumoniae NOT DETECTED NOT DETECTED Final   Streptococcus pyogenes NOT DETECTED NOT DETECTED Final   A.calcoaceticus-baumannii NOT DETECTED NOT DETECTED Final   Bacteroides fragilis NOT DETECTED NOT DETECTED Final   Enterobacterales NOT DETECTED NOT DETECTED Final   Enterobacter cloacae complex NOT DETECTED NOT DETECTED Final   Escherichia coli NOT DETECTED NOT DETECTED Final   Klebsiella aerogenes NOT DETECTED NOT DETECTED Final   Klebsiella oxytoca NOT DETECTED NOT DETECTED Final   Klebsiella pneumoniae NOT DETECTED NOT DETECTED Final   Proteus species NOT DETECTED NOT DETECTED Final   Salmonella species NOT DETECTED NOT DETECTED Final   Serratia marcescens NOT DETECTED NOT DETECTED Final   Haemophilus influenzae NOT DETECTED NOT DETECTED Final  Neisseria meningitidis NOT DETECTED NOT DETECTED Final   Pseudomonas aeruginosa NOT DETECTED NOT DETECTED Final   Stenotrophomonas maltophilia NOT DETECTED NOT DETECTED Final   Candida albicans NOT DETECTED NOT DETECTED Final   Candida auris NOT DETECTED NOT DETECTED Final   Candida glabrata NOT DETECTED NOT DETECTED Final   Candida krusei NOT DETECTED NOT DETECTED  Final   Candida parapsilosis NOT DETECTED NOT DETECTED Final   Candida tropicalis NOT DETECTED NOT DETECTED Final   Cryptococcus neoformans/gattii NOT DETECTED NOT DETECTED Final    Comment: Performed at [facility], [place] [address] [place], [place] [address]  Blood Culture (routine x 2)     Status: None   Collection Time: 03/28/22  3:54 PM   Specimen: BLOOD RIGHT HAND  Result Value Ref Range Status   Specimen Description   Final    BLOOD RIGHT HAND BOTTLES DRAWN AEROBIC AND ANAEROBIC   Special Requests Blood Culture adequate volume  Final   Culture   Final    NO GROWTH 6 DAYS Performed at [facility], [address] [place], [place] [address]    Report Status 04/03/2022 FINAL  Final  Resp panel by RT-PCR (RSV, Flu A&B, Covid) Anterior Nasal Swab     Status: None   Collection Time: 03/28/22  4:22 PM   Specimen: Anterior Nasal Swab  Result Value Ref Range Status   SARS Coronavirus 2 by RT PCR NEGATIVE NEGATIVE Final    Comment: (NOTE) SARS-CoV-2 target nucleic acids are NOT DETECTED.  The SARS-CoV-2 RNA is generally detectable in upper respiratory specimens during the acute phase of infection. The lowest concentration of SARS-CoV-2 viral copies this assay can detect is 138 copies/mL. A negative result does not preclude SARS-Cov-2 infection and should not be used as the sole basis for treatment or other patient management decisions. A negative result may occur with  improper specimen collection/handling, submission of specimen other than nasopharyngeal swab, presence of viral mutation(s) within the areas targeted by this assay, and inadequate number of viral copies(<138 copies/mL). A negative result must be combined with clinical observations, patient history, and epidemiological information. The expected result is Negative.  Fact Sheet for Patients:  EntrepreneurPulse.com.au  Fact Sheet for Healthcare Providers:   IncredibleEmployment.be  This test is no t yet approved or cleared by the [place] FDA and  has been authorized for detection and/or diagnosis of SARS-CoV-2 by FDA under an Emergency Use Authorization (EUA). This EUA will remain  in effect (meaning this test can be used) for the duration of the COVID-19 declaration under Section 564(b)(1) of the Act, 21 U.S.C.section 360bbb-3(b)(1), unless the authorization is terminated  or revoked sooner.       Influenza A by PCR NEGATIVE NEGATIVE Final   Influenza B by PCR NEGATIVE NEGATIVE Final    Comment: (NOTE) The Xpert Xpress SARS-CoV-2/FLU/RSV plus assay is intended as an aid in the diagnosis of influenza from Nasopharyngeal swab specimens and should not be used as a sole basis for treatment. Nasal washings and aspirates are unacceptable for Xpert Xpress SARS-CoV-2/FLU/RSV testing.  Fact Sheet for Patients: EntrepreneurPulse.com.au  Fact Sheet for Healthcare Providers: IncredibleEmployment.be  This test is not yet approved or cleared by the [place] FDA and has been authorized for detection and/or diagnosis of SARS-CoV-2 by FDA under an Emergency Use Authorization (EUA). This EUA will remain in effect (meaning this test can be used) for the duration of the COVID-19 declaration under Section 564(b)(1) of the Act, 21 U.S.C. section 360bbb-3(b)(1),  unless the authorization is terminated or revoked.     Resp Syncytial Virus by PCR NEGATIVE NEGATIVE Final    Comment: (NOTE) Fact Sheet for Patients: EntrepreneurPulse.com.au  Fact Sheet for Healthcare Providers: IncredibleEmployment.be  This test is not yet approved or cleared by the [place] FDA and has been authorized for detection and/or diagnosis of SARS-CoV-2 by FDA under an Emergency Use Authorization (EUA). This EUA will remain in effect (meaning this test can be used) for  the duration of the COVID-19 declaration under Section 564(b)(1) of the Act, 21 U.S.C. section 360bbb-3(b)(1), unless the authorization is terminated or revoked.  Performed at [facility], [address], [place], [place] [address]   Urine Culture     Status: None   Collection Time: 03/28/22  4:22 PM   Specimen: Urine, Catheterized  Result Value Ref Range Status   Specimen Description   Final    URINE, CATHETERIZED Performed at [facility], [address], [place], [place] [address]    Special Requests   Final    NONE Performed at [facility], [address], [place], [place] [address]    Culture   Final    NO GROWTH Performed at [facility], [place] [address], [place], [place] [address]    Report Status 03/30/2022 FINAL  Final  Urine Culture (for pregnant, neutropenic or urologic patients or patients with an indwelling urinary catheter)     Status: None   Collection Time: 03/28/22  9:09 PM   Specimen: Urine, Clean Catch  Result Value Ref Range Status   Specimen Description URINE, CLEAN CATCH  Final   Special Requests NONE  Final   Culture   Final    NO GROWTH Performed at [facility], [place] [address] [place], [place] [address]    Report Status 03/30/2022 FINAL  Final  Culture, Respiratory w Gram Stain     Status: None   Collection Time: 03/28/22  9:09 PM   Specimen: Tracheal Aspirate; Respiratory  Result Value Ref Range Status   Specimen Description TRACHEAL ASPIRATE  Final   Special Requests NONE  Final   Gram Stain   Final    RARE WBC PRESENT, PREDOMINANTLY PMN FEW GRAM POSITIVE COCCI IN PAIRS Performed at [facility], [place] [address], [place], [place] [address]    Culture MODERATE STREPTOCOCCUS PNEUMONIAE  Final   Report Status 04/02/2022 FINAL  Final   Organism ID, Bacteria STREPTOCOCCUS PNEUMONIAE  Final      Susceptibility   Streptococcus pneumoniae - MIC*    ERYTHROMYCIN <=0.12 SENSITIVE Sensitive     LEVOFLOXACIN 0.5 SENSITIVE Sensitive      VANCOMYCIN 0.5 SENSITIVE Sensitive     PENICILLIN (meningitis) <=0.06 SENSITIVE Sensitive     PENO - penicillin <=0.06      PENICILLIN (non-meningitis) <=0.06 SENSITIVE Sensitive     PENICILLIN (oral) <=0.06 SENSITIVE Sensitive     CEFTRIAXONE (non-meningitis) <=0.12 SENSITIVE Sensitive     CEFTRIAXONE (meningitis) <=0.12 SENSITIVE Sensitive     * MODERATE STREPTOCOCCUS PNEUMONIAE  Respiratory (~20 pathogens) panel by PCR     Status: Abnormal   Collection Time: 03/28/22  9:09 PM   Specimen: Tracheal Aspirate; Respiratory  Result Value Ref Range Status   Adenovirus NOT DETECTED NOT DETECTED Final   Coronavirus 229E NOT DETECTED NOT DETECTED Final    Comment: (NOTE) The Coronavirus on the Respiratory Panel, DOES NOT test for the novel  Coronavirus (2019 nCoV)    Coronavirus HKU1 NOT DETECTED  NOT DETECTED Final   Coronavirus NL63 NOT DETECTED NOT DETECTED Final   Coronavirus OC43 DETECTED (A) NOT DETECTED Final   Metapneumovirus NOT DETECTED NOT DETECTED Final   Rhinovirus / Enterovirus NOT DETECTED NOT DETECTED Final   Influenza A NOT DETECTED NOT DETECTED Final   Influenza B NOT DETECTED NOT DETECTED Final   Parainfluenza Virus 1 NOT DETECTED NOT DETECTED Final   Parainfluenza Virus 2 NOT DETECTED NOT DETECTED Final   Parainfluenza Virus 3 NOT DETECTED NOT DETECTED Final   Parainfluenza Virus 4 NOT DETECTED NOT DETECTED Final   Respiratory Syncytial Virus NOT DETECTED NOT DETECTED Final   Bordetella pertussis NOT DETECTED NOT DETECTED Final   Bordetella Parapertussis NOT DETECTED NOT DETECTED Final   Chlamydophila pneumoniae NOT DETECTED NOT DETECTED Final   Mycoplasma pneumoniae NOT DETECTED NOT DETECTED Final    Comment: Performed at [facility], [place] [address] [place], [place] [address]  MRSA Next Gen by PCR, Nasal     Status: Abnormal   Collection Time: 03/28/22  9:09 PM   Specimen: Nasal Mucosa; Nasal Swab  Result Value Ref Range Status   MRSA by PCR Next Gen  DETECTED (A) NOT DETECTED Final    Comment: RESULT CALLED TO, READ BACK BY AND VERIFIED WITH: SAVAGE RN 03/28/22 @ 5638 BY AB (NOTE) The GeneXpert MRSA Assay (FDA approved for NASAL specimens only), is one component of a comprehensive MRSA colonization surveillance program. It is not intended to diagnose MRSA infection nor to guide or monitor treatment for MRSA infections. Test performance is not FDA approved in patients less than 44 years old. Performed at [facility], [place] [address], [place], [place] [address]     RADIOLOGY STUDIES/RESULTS: DG CHEST PORT 1 VIEW  Result Date: 04/01/2022 CLINICAL DATA:  Respiratory distress EXAM: PORTABLE CHEST 1 VIEW COMPARISON:  03/28/2022 FINDINGS: Interval extubation. There is narrowing of the subglottic airway within the transverse dimension which may relate to subglottic stenosis or edema. Pulmonary insufflation has been preserved. Diffuse interstitial pulmonary infiltrates persist but are improved from prior examination, likely infectious or inflammatory. No pneumothorax or pleural effusion. Transcatheter aortic valve replacement and coronary artery bypass grafting has been performed. Mild cardiomegaly is stable. Pulmonary vascularity is normal. No acute bone abnormality. IMPRESSION: 1. Interval extubation.  Preserved pulmonary insufflation. 2. Persistent but improved diffuse interstitial pulmonary infiltrates, likely infectious or inflammatory. 3. Narrowing of the subglottic airway may relate to subglottic stenosis or edema. Correlation with direct visualization may be helpful. Electronically Signed   By: Fidela Salisbury M.D.   On: 04/01/2022 22:46     LOS: 6 days   Oren Binet, MD  [facility]    To contact the attending provider between 7A-7P or the covering provider during after hours 7P-7A, please log into the web site [email or web address] and access using universal [place] password for that web site. If you do not have the  password, please call the hospital operator.  04/03/2022, 12:06 PM

## 2022-04-03 NOTE — Plan of Care (Signed)

## 2022-04-03 NOTE — Progress Notes (Signed)
Mobility Specialist Progress Note   04/03/22 1600  Mobility  Activity Stood at bedside;Dangled on edge of bed (LE exercise; STS x4)  Level of Assistance Contact guard assist, steadying assist  Assistive Device Front wheel walker  Range of Motion/Exercises Active;All extremities  Activity Response Tolerated well   Patient received in supine, sleep and easily aroused. Presented with deficits to cognition but answers questions appropriately. Agreeable to participate. Required minimal HHA for bed mobility and stood with min A + verbal cues for hand placement. Upon initial stand patient c/o dizziness and oxygen desaturated to 86%. Needed seated rest and cued pursed lip breathing for oxygen saturation to increase >90%. Completed LE exercises and x4 STS total with seated rest in between secondary to oxygen saturation fluctuating 86-88%. Ambulation not appropriate at this time. Returned to supine without complaint or incident. Was left with all needs met, call bell in reach.   Emily Rocha, BS EXP Mobility Specialist Please contact via SecureChat or Rehab office at [phone]

## 2022-04-04 ENCOUNTER — Inpatient Hospital Stay (HOSPITAL_COMMUNITY): Payer: Medicare HMO

## 2022-04-04 DIAGNOSIS — J189 Pneumonia, unspecified organism: Secondary | ICD-10-CM | POA: Diagnosis not present

## 2022-04-04 DIAGNOSIS — A419 Sepsis, unspecified organism: Secondary | ICD-10-CM | POA: Diagnosis not present

## 2022-04-04 DIAGNOSIS — J9602 Acute respiratory failure with hypercapnia: Secondary | ICD-10-CM | POA: Diagnosis not present

## 2022-04-04 DIAGNOSIS — R7989 Other specified abnormal findings of blood chemistry: Secondary | ICD-10-CM | POA: Diagnosis not present

## 2022-04-04 LAB — GLUCOSE, CAPILLARY
Glucose-Capillary: 89 mg/dL (ref 70–99)
Glucose-Capillary: 156 mg/dL — ABNORMAL HIGH (ref 70–99)
Glucose-Capillary: 186 mg/dL — ABNORMAL HIGH (ref 70–99)
Glucose-Capillary: 189 mg/dL — ABNORMAL HIGH (ref 70–99)

## 2022-04-04 LAB — BASIC METABOLIC PANEL
Anion gap: 10 (ref 5–15)
BUN: 19 mg/dL (ref 8–23)
CO2: 43 mmol/L — ABNORMAL HIGH (ref 22–32)
Calcium: 9 mg/dL (ref 8.9–10.3)
Chloride: 84 mmol/L — ABNORMAL LOW (ref 98–111)
Creatinine, Ser: 0.52 mg/dL (ref 0.44–1.00)
GFR, Estimated: >60 mL/min (ref >60)
Glucose, Bld: 97 mg/dL (ref 70–99)
Potassium: 3.3 mmol/L — ABNORMAL LOW (ref 3.5–5.1)
Sodium: 137 mmol/L (ref 135–145)

## 2022-04-04 LAB — MAGNESIUM: Magnesium: 2.1 mg/dL (ref 1.7–2.4)

## 2022-04-04 LAB — PHOSPHORUS: Phosphorus: 3 mg/dL (ref 2.5–4.6)

## 2022-04-04 MED ORDER — LACTULOSE 10 GM/15ML PO SOLN
20.0000 g | Freq: Every day | ORAL | Status: DC
  Administered 2022-04-04 – 2022-04-05 (×2): 20 g via ORAL
  Filled 2022-04-04: qty 30

## 2022-04-04 MED ORDER — ACETAZOLAMIDE 250 MG PO TABS
500.0000 mg | ORAL_TABLET | Freq: Two times a day (BID) | ORAL | Status: DC
  Administered 2022-04-04 – 2022-04-05 (×3): 500 mg via ORAL
  Filled 2022-04-04 (×3): qty 2

## 2022-04-04 MED ORDER — MELATONIN 3 MG PO TABS
3.0000 mg | ORAL_TABLET | Freq: Every day | ORAL | Status: DC
  Administered 2022-04-04 – 2022-04-05 (×2): 3 mg via ORAL
  Filled 2022-04-04 (×2): qty 1

## 2022-04-04 MED ORDER — POTASSIUM CHLORIDE CRYS ER 20 MEQ PO TBCR
40.0000 meq | EXTENDED_RELEASE_TABLET | Freq: Once | ORAL | Status: AC
  Administered 2022-04-04: 40 meq via ORAL
  Filled 2022-04-04: qty 2

## 2022-04-04 MED ORDER — METHYLPREDNISOLONE SODIUM SUCC 40 MG IJ SOLR
40.0000 mg | Freq: Two times a day (BID) | INTRAMUSCULAR | Status: DC
  Administered 2022-04-04 – 2022-04-05 (×3): 40 mg via INTRAVENOUS
  Filled 2022-04-04 (×3): qty 1

## 2022-04-04 NOTE — Progress Notes (Addendum)
PROGRESS NOTE        PATIENT DETAILS Name: Emily Rocha Age: 67 y.o. Sex: female Date of Birth: 08-02-1955 Admit Date: 03/28/2022 Admitting Physician Collier Bullock, MD ZOX:WRUE, Costella Hatcher, MD  Brief Summary: Patient is a 67 y.o.  female w hx of COPD on Home O2, severe AS s/p TAVR, CAD-s/p CABG admitted via [place] ED to [facility] ICU for acute on chronic combined hypoxic and hypercarbic respiratory failure-requiring intubation.  Stabilized in the ICU-hospital course complicated by development of A-fib with RVR.  Transferred to [facility] on 2/15.  Significant events: 2/11>> admit to ICU by PCCM 2/12>> extubated 2/13>> episode of A-fib RVR 2/15>> transferred to [facility] an episode of shortness of breath-CXR with possible subglottic stenosis or edema-changed to IV steroids.  Significant studies: 2/11>> CT chest/abdomen/pelvis: Multifocal pneumonia 2/12>> CT head: No acute intracranial abnormality 2/13>> echo: EF 45-40%, RV systolic function moderately reduced.  Normal structure/functioning of aortic valve prosthesis 2/15>> CXR: Narrowing of the subglottic airway may reflect stenosis/edema  Significant microbiology data: 2/11>> COVID/influenza/RSV PCR: Negative 2/11>> respiratory virus panel: +ve Coronavirus OC43 2/11>> blood culture: 1/2 Staph hominis (contamination) 2/11>> tracheal aspirate: Streptococcus pneumoniae  Procedures: 2/11-2/12>> ETT  Consults: PCCM.  Subjective: Some confusion last night but completely awake/alert this morning.  Was sleeping when I walked in but easily aroused with just verbal stimuli.  Objective: Vitals: Blood pressure (!) 99/44, pulse 62, temperature 98.1 F (36.7 C), temperature source Axillary, resp. rate 16, height 5\' 4"  (1.626 m), weight 61.2 kg, SpO2 100 %.   Exam: Gen Exam:Alert awake-not in any distress HEENT:atraumatic, normocephalic Chest: Some scattered rhonchi today CVS:S1S2 regular Abdomen:soft non tender, non  distended Extremities:no edema Neurology: Non focal Skin: no rash  Pertinent Labs/Radiology:    Latest Ref Rng & Units 04/02/2022   10:01 AM 03/31/2022   12:27 AM 03/30/2022    2:04 AM  CBC  WBC 4.0 - 10.5 K/uL 10.1  11.3  11.7   Hemoglobin 12.0 - 15.0 g/dL 12.7  11.8  11.9   Hematocrit 36.0 - 46.0 % 43.4  40.4  39.8   Platelets 150 - 400 K/uL 224  207  187     Lab Results  Component Value Date   NA 137 04/04/2022   K 3.3 (L) 04/04/2022   CL 84 (L) 04/04/2022   CO2 43 (H) 04/04/2022      Assessment/Plan: Septic shock secondary to multifocal PNA with ARDS (coronavirus and streptococcal pneumonia on culture data) Sepsis physiology has resolved Culture data as above Completed Rocephin x 7 days total on 2/17  Acute on chronic hypoxic and hypercarbic respiratory failure Due to PNA/COPD exacerbation Intubated on 2/11-extubated on 2/12 Overall improved-stable on usual home regimen of 3-4 L Taper steroids over next few days Continue bronchodilators  Acute metabolic encephalopathy Due to hypercarbia Overall much improved-awake/alert this morning Some delirium last night-maintain delirium precautions.  COPD exacerbation Improved Taper steroids over the next few days Continue bronchodilators  ?  Subglottic edema Developed SOB last night-CXR with narrowing of the subglottic airway She does not appear to have any stridor/wheezing-not symptomatic Could be related to recent intubation related inflammation-now on IV steroids Await CT chest.  A-fib RVR Occurred on 2/30 Back to sinus rhythm Echo as above Continue metoprolol Continue Eliquis  Elevated troponins likely due to demand ischemia No chest pain Echo with stable EF  CAD s/p CABG 2018 and PCI April 2022 Elevated troponins on admission likely due to demand ischemia Continue statin/beta-blocker-no longer on aspirin as on Eliquis Will require cardiology follow-up at some point   History of severe aortic  stenosis-s/p TAVR 2022 Echo shows normal structure/function of prosthetic valve  Chronic diastolic heart failure Volume status relatively stable Continue to hold Demadex-on Diamox (dosage increased today) given severe metabolic alkalosis.  Hypokalemia Replete/recheck  Mildly elevated ammonia levels Unclear etiology No history of cirrhosis Continue to titrate down lactulose-changed to daily dosing today.  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   Weight as of this encounter: 61.2 kg.   Code status:   Code Status: Full Code   DVT Prophylaxis: SCDs Start: 03/28/22 2128 apixaban (ELIQUIS) tablet 5 mg    Family Communication: Spouse-Dexter Schnyder-[phone]-updated over the phone 2/17   Disposition Plan: Status is: Inpatient Remains inpatient appropriate because: Severity of illness   Planned Discharge Destination:Skilled nursing facility   Diet: Diet Order             Diet Heart Room service appropriate? Yes; Fluid consistency: Thin  Diet effective now                     Antimicrobial agents: Anti-infectives (From admission, onward)    Start     Dose/Rate Route Frequency Ordered Stop   04/02/22 1000  cefTRIAXone (ROCEPHIN) 2 g in sodium chloride 0.9 % 100 mL IVPB        2 g 200 mL/hr over 30 Minutes Intravenous Every 24 hours 04/01/22 1419 04/03/22 1029   03/30/22 1400  vancomycin (VANCOREADY) IVPB 750 mg/150 mL  Status:  Discontinued        750 mg 150 mL/hr over 60 Minutes Intravenous Every 12 hours 03/30/22 1014 03/31/22 1518   03/30/22 1000   cefTRIAXone (ROCEPHIN) 2 g in sodium chloride 0.9 % 100 mL IVPB  Status:  Discontinued        2 g 200 mL/hr over 30 Minutes Intravenous Every 24 hours 03/30/22 0837 04/01/22 1419   03/30/22 1000  azithromycin (ZITHROMAX) tablet 500 mg  Status:  Discontinued        500 mg Oral Daily 03/30/22 0842 03/31/22 0823   03/30/22 0200  vancomycin (VANCOREADY) IVPB 500 mg/100 mL  Status:  Discontinued        500 mg 100 mL/hr over 60 Minutes Intravenous Every 12 hours 03/29/22 1454 03/30/22 1014   03/29/22 1415  vancomycin (VANCOREADY) IVPB 1500 mg/300 mL        1,500 mg 150 mL/hr over 120 Minutes Intravenous  Once 03/29/22 1315 03/29/22 1544   03/28/22 1600  cefTRIAXone (ROCEPHIN) 2 g in sodium chloride 0.9 % 100 mL IVPB  Status:  Discontinued        2 g 200 mL/hr over 30 Minutes Intravenous Every 24 hours 03/28/22 1549 03/30/22 0813   03/28/22 1600  azithromycin (ZITHROMAX) 500 mg in sodium chloride 0.9 % 250 mL IVPB  Status:  Discontinued        500 mg 250 mL/hr over 60 Minutes  Intravenous Every 24 hours 03/28/22 1549 03/30/22 0842        MEDICATIONS: Scheduled Meds:  acetaZOLAMIDE  250 mg Oral BID   apixaban  5 mg Oral BID   arformoterol  15 mcg Nebulization BID   aspirin  81 mg Oral Daily   budesonide (PULMICORT) nebulizer solution  0.5 mg Nebulization BID   guaiFENesin  600 mg Oral BID   insulin aspart  0-15 Units Subcutaneous TID WC   lactulose  20 g Oral BID   melatonin  3 mg Oral QHS   methylPREDNISolone (SOLU-MEDROL) injection  120 mg Intravenous Daily   metoprolol tartrate  37.5 mg Oral BID   nicotine  14 mg Transdermal Daily   pantoprazole  40 mg Oral Daily   polyethylene glycol  17 g Oral Daily   potassium chloride  40 mEq Oral Once   revefenacin  175 mcg Nebulization Daily   Continuous Infusions:  sodium chloride 10 mL/hr at 04/03/22 0529   sodium chloride Stopped (03/30/22 1303)   PRN Meds:.acetaminophen, albuterol, ALPRAZolam, metoprolol tartrate, ondansetron (ZOFRAN)  IV, mouth rinse, polyethylene glycol   I have personally reviewed following labs and imaging studies  LABORATORY DATA: CBC: Recent Labs  Lab 03/28/22 1549 03/28/22 1551 03/28/22 2249 03/29/22 0542 03/30/22 0204 03/31/22 0027 04/02/22 1001  WBC 20.7*  --   --  10.6* 11.7* 11.3* 10.1  NEUTROABS 17.6*  --   --   --   --   --   --   HGB 13.2   < > 11.6* 11.2* 11.9* 11.8* 12.7  HCT 44.8   < > 34.0* 37.6 39.8 40.4 43.4  MCV 90.5  --   --  87.9 89.4 91.0 89.3  PLT 199  --   --  152 187 207 224   < > = values in this interval not displayed.     Basic Metabolic Panel: Recent Labs  Lab 03/30/22 0204 03/31/22 0027 04/01/22 0233 04/02/22 1001 04/03/22 0412 04/04/22 0422  NA 138 135 136 136 136 137  K 5.1 5.6* 4.3 4.8 4.3 3.3*  CL 99 96* 90* 86* 82* 84*  CO2 29 30 36* 40* >45* 43*  GLUCOSE 134* 110* 102* 168* 104* 97  BUN 35* 39* 33* 22 23 19   CREATININE 0.81 0.64 0.54 0.47 0.39* 0.52  CALCIUM 8.7* 9.0 8.9 8.9 8.7* 9.0  MG 3.5*  --  2.2 2.0 1.9 2.1  PHOS  --  3.1 2.5 3.7 2.9 3.0     GFR: Estimated Creatinine Clearance: 58.1 mL/min (by C-G formula based on SCr of 0.52 mg/dL).  Liver Function Tests: Recent Labs  Lab 03/28/22 1549 03/29/22 0542 03/31/22 0027 04/02/22 1001  AST 163* 78* 80* 36  ALT 166* 105* 101* 67*  ALKPHOS 147* 117 145* 107  BILITOT 1.9* 1.1 0.6 0.9  PROT 7.3 5.6* 6.6 6.1*  ALBUMIN 3.3* 2.3* 2.7* 2.8*    No results for input(s): "LIPASE", "AMYLASE" in the last 168 hours. Recent Labs  Lab 03/30/22 0204 03/31/22 0027 04/01/22 0233 04/02/22 1001 04/03/22 0412  AMMONIA 47* 71* 46* 53* 55*     Coagulation Profile: Recent Labs  Lab 03/28/22 1549  INR 1.2     Cardiac Enzymes: No results for input(s): "CKTOTAL", "CKMB", "CKMBINDEX", "TROPONINI" in the last 168 hours.  BNP (last 3 results) No results for input(s): "PROBNP" in the last 8760 hours.  Lipid Profile: No results for input(s): "CHOL", "HDL", "LDLCALC", "TRIG", "CHOLHDL",  "LDLDIRECT" in the last 72 hours.  Thyroid Function Tests: No results for input(s): "TSH", "T4TOTAL", "FREET4", "T3FREE", "THYROIDAB" in the last 72 hours.  Anemia Panel: No results for input(s): "VITAMINB12", "FOLATE", "FERRITIN", "TIBC", "IRON", "RETICCTPCT" in the last 72 hours.  Urine analysis:    Component Value Date/Time   COLORURINE YELLOW 03/28/2022 1622   APPEARANCEUR CLEAR 03/28/2022 1622   LABSPEC 1.014 03/28/2022 1622   PHURINE 6.0 03/28/2022 1622   GLUCOSEU NEGATIVE 03/28/2022 1622   HGBUR NEGATIVE 03/28/2022 1622   BILIRUBINUR NEGATIVE 03/28/2022 1622   KETONESUR 5 (A) 03/28/2022 1622   PROTEINUR 30 (A) 03/28/2022 1622   UROBILINOGEN 0.2 12/04/2014 1737   NITRITE NEGATIVE 03/28/2022 1622   LEUKOCYTESUR NEGATIVE 03/28/2022 1622    Sepsis Labs: Lactic Acid, Venous    Component Value Date/Time   LATICACIDVEN 1.0 03/29/2022 0255    MICROBIOLOGY: Recent Results (from the past 240 hour(s))  Blood Culture (routine x 2)     Status: Abnormal   Collection Time: 03/28/22  3:49 PM   Specimen: BLOOD LEFT FOREARM  Result Value Ref Range Status   Specimen Description   Final    BLOOD LEFT FOREARM BOTTLES DRAWN AEROBIC AND ANAEROBIC Performed at [facility], [address] [place], [place] [address]    Special Requests   Final    Blood Culture adequate volume Performed at [facility], [address] [place], [place] [address]    Culture  Setup Time   Final    AEROBIC BOTTLE ONLY GRAM POSITIVE COCCI Gram Stain Report Called to,Read Back By and Verified With: PHYLLIS MCKINNEY @ 0814 ON 02/26/22 C VARNER CRITICAL RESULT CALLED TO, READ BACK BY AND VERIFIED WITH:  C/ PHARMD T. DANG 03/29/22 1719 A. LAFRANCE    Culture (A)  Final    STAPHYLOCOCCUS HOMINIS THE SIGNIFICANCE OF ISOLATING THIS ORGANISM FROM A SINGLE SET OF BLOOD CULTURES WHEN MULTIPLE SETS ARE DRAWN IS UNCERTAIN. PLEASE NOTIFY THE MICROBIOLOGY DEPARTMENT WITHIN ONE WEEK IF SPECIATION AND SENSITIVITIES ARE  REQUIRED. Performed at [facility], [place] [address], [place], [place] [address]    Report Status 03/31/2022 FINAL  Final  Blood Culture ID Panel (Reflexed)     Status: Abnormal   Collection Time: 03/28/22  3:49 PM  Result Value Ref Range Status   Enterococcus faecalis NOT DETECTED NOT DETECTED Final   Enterococcus Faecium NOT DETECTED NOT DETECTED Final   Listeria monocytogenes NOT DETECTED NOT DETECTED Final   Staphylococcus species DETECTED (A) NOT DETECTED Final    Comment: CRITICAL RESULT CALLED TO, READ BACK BY AND VERIFIED WITH:  C/ PHARMD T. DANG 03/29/22 1719 A. LAFRANCE    Staphylococcus aureus (BCID) NOT DETECTED NOT DETECTED Final   Staphylococcus epidermidis NOT DETECTED NOT DETECTED Final   Staphylococcus lugdunensis NOT DETECTED NOT DETECTED Final   Streptococcus species NOT DETECTED NOT DETECTED Final   Streptococcus agalactiae NOT DETECTED NOT DETECTED Final   Streptococcus pneumoniae NOT DETECTED NOT DETECTED Final   Streptococcus pyogenes NOT DETECTED NOT DETECTED Final   A.calcoaceticus-baumannii NOT DETECTED NOT DETECTED Final   Bacteroides fragilis NOT DETECTED NOT DETECTED Final   Enterobacterales NOT DETECTED NOT DETECTED Final   Enterobacter cloacae complex NOT DETECTED NOT DETECTED Final   Escherichia coli NOT DETECTED NOT DETECTED Final   Klebsiella aerogenes NOT DETECTED NOT DETECTED Final   Klebsiella oxytoca NOT DETECTED NOT DETECTED Final   Klebsiella pneumoniae NOT DETECTED NOT DETECTED Final   Proteus species NOT DETECTED NOT DETECTED Final   Salmonella species NOT DETECTED NOT DETECTED Final  Serratia marcescens NOT DETECTED NOT DETECTED Final   Haemophilus influenzae NOT DETECTED NOT DETECTED Final   Neisseria meningitidis NOT DETECTED NOT DETECTED Final   Pseudomonas aeruginosa NOT DETECTED NOT DETECTED Final   Stenotrophomonas maltophilia NOT DETECTED NOT DETECTED Final   Candida albicans NOT DETECTED NOT DETECTED Final   Candida auris  NOT DETECTED NOT DETECTED Final   Candida glabrata NOT DETECTED NOT DETECTED Final   Candida krusei NOT DETECTED NOT DETECTED Final   Candida parapsilosis NOT DETECTED NOT DETECTED Final   Candida tropicalis NOT DETECTED NOT DETECTED Final   Cryptococcus neoformans/gattii NOT DETECTED NOT DETECTED Final    Comment: Performed at [facility], [place] [address], [place], [place] [address]  Blood Culture (routine x 2)     Status: None   Collection Time: 03/28/22  3:54 PM   Specimen: BLOOD RIGHT HAND  Result Value Ref Range Status   Specimen Description   Final    BLOOD RIGHT HAND BOTTLES DRAWN AEROBIC AND ANAEROBIC   Special Requests Blood Culture adequate volume  Final   Culture   Final    NO GROWTH 6 DAYS Performed at [facility], [address] [place], [place] [address]    Report Status 04/03/2022 FINAL  Final  Resp panel by RT-PCR (RSV, Flu A&B, Covid) Anterior Nasal Swab     Status: None   Collection Time: 03/28/22  4:22 PM   Specimen: Anterior Nasal Swab  Result Value Ref Range Status   SARS Coronavirus 2 by RT PCR NEGATIVE NEGATIVE Final    Comment: (NOTE) SARS-CoV-2 target nucleic acids are NOT DETECTED.  The SARS-CoV-2 RNA is generally detectable in upper respiratory specimens during the acute phase of infection. The lowest concentration of SARS-CoV-2 viral copies this assay can detect is 138 copies/mL. A negative result does not preclude SARS-Cov-2 infection and should not be used as the sole basis for treatment or other patient management decisions. A negative result may occur with  improper specimen collection/handling, submission of specimen other than nasopharyngeal swab, presence of viral mutation(s) within the areas targeted by this assay, and inadequate number of viral copies(<138 copies/mL). A negative result must be combined with clinical observations, patient history, and epidemiological information. The expected result is Negative.  Fact Sheet for  Patients:  EntrepreneurPulse.com.au  Fact Sheet for Healthcare Providers:  IncredibleEmployment.be  This test is no t yet approved or cleared by the [place] FDA and  has been authorized for detection and/or diagnosis of SARS-CoV-2 by FDA under an Emergency Use Authorization (EUA). This EUA will remain  in effect (meaning this test can be used) for the duration of the COVID-19 declaration under Section 564(b)(1) of the Act, 21 U.S.C.section 360bbb-3(b)(1), unless the authorization is terminated  or revoked sooner.       Influenza A by PCR NEGATIVE NEGATIVE Final   Influenza B by PCR NEGATIVE NEGATIVE Final    Comment: (NOTE) The Xpert Xpress SARS-CoV-2/FLU/RSV plus assay is intended as an aid in the diagnosis of influenza from Nasopharyngeal swab specimens and should not be used as a sole basis for treatment. Nasal washings and aspirates are unacceptable for Xpert Xpress SARS-CoV-2/FLU/RSV testing.  Fact Sheet for Patients: EntrepreneurPulse.com.au  Fact Sheet for Healthcare Providers: IncredibleEmployment.be  This test is not yet approved or cleared by the [place] FDA and has been authorized for detection and/or diagnosis of SARS-CoV-2 by FDA under an Emergency Use Authorization (EUA). This EUA will remain in effect (meaning this test can be  used) for the duration of the COVID-19 declaration under Section 564(b)(1) of the Act, 21 U.S.C. section 360bbb-3(b)(1), unless the authorization is terminated or revoked.     Resp Syncytial Virus by PCR NEGATIVE NEGATIVE Final    Comment: (NOTE) Fact Sheet for Patients: EntrepreneurPulse.com.au  Fact Sheet for Healthcare Providers: IncredibleEmployment.be  This test is not yet approved or cleared by the [place] FDA and has been authorized for detection and/or diagnosis of SARS-CoV-2 by FDA under an Emergency  Use Authorization (EUA). This EUA will remain in effect (meaning this test can be used) for the duration of the COVID-19 declaration under Section 564(b)(1) of the Act, 21 U.S.C. section 360bbb-3(b)(1), unless the authorization is terminated or revoked.  Performed at [facility], [address], [place], [place] [address]   Urine Culture     Status: None   Collection Time: 03/28/22  4:22 PM   Specimen: Urine, Catheterized  Result Value Ref Range Status   Specimen Description   Final    URINE, CATHETERIZED Performed at [facility], [address], [place], [place] [address]    Special Requests   Final    NONE Performed at [facility], [address] [place], [place] [address]    Culture   Final    NO GROWTH Performed at [facility], [place] [address], [place], [place] [address]    Report Status 03/30/2022 FINAL  Final  Urine Culture (for pregnant, neutropenic or urologic patients or patients with an indwelling urinary catheter)     Status: None   Collection Time: 03/28/22  9:09 PM   Specimen: Urine, Clean Catch  Result Value Ref Range Status   Specimen Description URINE, CLEAN CATCH  Final   Special Requests NONE  Final   Culture   Final    NO GROWTH Performed at [facility], [place] [address], [place], [place] [address]    Report Status 03/30/2022 FINAL  Final  Culture, Respiratory w Gram Stain     Status: None   Collection Time: 03/28/22  9:09 PM   Specimen: Tracheal Aspirate; Respiratory  Result Value Ref Range Status   Specimen Description TRACHEAL ASPIRATE  Final   Special Requests NONE  Final   Gram Stain   Final    RARE WBC PRESENT, PREDOMINANTLY PMN FEW GRAM POSITIVE COCCI IN PAIRS Performed at [facility], [place] [address], [place], [place] [address]    Culture MODERATE STREPTOCOCCUS PNEUMONIAE  Final   Report Status 04/02/2022 FINAL  Final   Organism ID, Bacteria STREPTOCOCCUS PNEUMONIAE  Final      Susceptibility   Streptococcus pneumoniae -  MIC*    ERYTHROMYCIN <=0.12 SENSITIVE Sensitive     LEVOFLOXACIN 0.5 SENSITIVE Sensitive     VANCOMYCIN 0.5 SENSITIVE Sensitive     PENICILLIN (meningitis) <=0.06 SENSITIVE Sensitive     PENO - penicillin <=0.06      PENICILLIN (non-meningitis) <=0.06 SENSITIVE Sensitive     PENICILLIN (oral) <=0.06 SENSITIVE Sensitive     CEFTRIAXONE (non-meningitis) <=0.12 SENSITIVE Sensitive     CEFTRIAXONE (meningitis) <=0.12 SENSITIVE Sensitive     * MODERATE STREPTOCOCCUS PNEUMONIAE  Respiratory (~20 pathogens) panel by PCR     Status: Abnormal   Collection Time: 03/28/22  9:09 PM   Specimen: Tracheal Aspirate; Respiratory  Result Value Ref Range Status   Adenovirus NOT DETECTED NOT DETECTED Final   Coronavirus 229E NOT DETECTED NOT DETECTED Final    Comment: (NOTE) The Coronavirus on the Respiratory  Panel, DOES NOT test for the novel  Coronavirus (2019 nCoV)    Coronavirus HKU1 NOT DETECTED NOT DETECTED Final   Coronavirus NL63 NOT DETECTED NOT DETECTED Final   Coronavirus OC43 DETECTED (A) NOT DETECTED Final   Metapneumovirus NOT DETECTED NOT DETECTED Final   Rhinovirus / Enterovirus NOT DETECTED NOT DETECTED Final   Influenza A NOT DETECTED NOT DETECTED Final   Influenza B NOT DETECTED NOT DETECTED Final   Parainfluenza Virus 1 NOT DETECTED NOT DETECTED Final   Parainfluenza Virus 2 NOT DETECTED NOT DETECTED Final   Parainfluenza Virus 3 NOT DETECTED NOT DETECTED Final   Parainfluenza Virus 4 NOT DETECTED NOT DETECTED Final   Respiratory Syncytial Virus NOT DETECTED NOT DETECTED Final   Bordetella pertussis NOT DETECTED NOT DETECTED Final   Bordetella Parapertussis NOT DETECTED NOT DETECTED Final   Chlamydophila pneumoniae NOT DETECTED NOT DETECTED Final   Mycoplasma pneumoniae NOT DETECTED NOT DETECTED Final    Comment: Performed at [facility], [place] [address] [place], [place] [address]  MRSA Next Gen by PCR, Nasal     Status: Abnormal   Collection Time: 03/28/22  9:09 PM    Specimen: Nasal Mucosa; Nasal Swab  Result Value Ref Range Status   MRSA by PCR Next Gen DETECTED (A) NOT DETECTED Final    Comment: RESULT CALLED TO, READ BACK BY AND VERIFIED WITH: SAVAGE RN 03/28/22 @ 0160 BY AB (NOTE) The GeneXpert MRSA Assay (FDA approved for NASAL specimens only), is one component of a comprehensive MRSA colonization surveillance program. It is not intended to diagnose MRSA infection nor to guide or monitor treatment for MRSA infections. Test performance is not FDA approved in patients less than 57 years old. Performed at [facility], [place] [address], [place], [place]  [address]     RADIOLOGY STUDIES/RESULTS: No results found.   LOS: 7 days   Oren Binet, MD  [facility]    To contact the attending provider between 7A-7P or the covering provider during after hours 7P-7A, please log into the web site [email or web address] and access using universal [place] password for that web site. If you do not have the password, please call the hospital operator.  04/04/2022, 9:45 AM

## 2022-04-04 NOTE — TOC Progression Note (Signed)
Transition of Care [facility]) - Progression Note    Patient Details  Name: Emily Rocha MRN: [id number] Date of Birth: October 04, 1955  Transition of Care [facility]) CM/SW Contact  Emeterio Reeve, [place] Phone Number: 04/04/2022, 4:37 PM  Clinical Narrative:     CSW gave bed offers to pt to review with family.   Expected Discharge Plan: [place] Barriers to Discharge: Continued Medical Work up  Expected Discharge Plan and Services In-house Referral: Clinical Social Work     Living arrangements for the past 2 months: Single Family Home                                       Social Determinants of Health (SDOH) Interventions SDOH Screenings   Depression (PHQ2-9): Low Risk  (10/28/2020)  Tobacco Use: High Risk (03/28/2022)    Readmission Risk Interventions    08/14/2020    2:35 PM  Readmission Risk Prevention Plan  Post Dischage Appt Complete  Medication Screening Complete  Transportation Screening Complete   Emeterio Reeve, LCSW Clinical Social Worker

## 2022-04-04 NOTE — Progress Notes (Addendum)
Patient has increasingly become more confused tonight than my past 2 nights working with her. She became very agitated around 4am and told me I wasn't the smart Emily Rocha or the real Emily Rocha, the real Emily Rocha was [name] her coworker and that I wasn't her. Patient became more angry and paranoid when I walked behind her to the other side of the bed to turn off the bed alarm. She was not compliant and would not lay down to enable me to turn the alarm off and reset the bed alarm. With much coaching she laid down. Paged hospitalist to notify of patients paranoia and agitation and that she is also a CO2 retainer on 6L [place]. This nurse had to have another RN's help to administer 0.25mg  Ativan PO.   06:30 UPDATE: The ativan helped patient to rest, when she woke back up she was in a pleasant mood and had forgotten the events earlier.

## 2022-04-05 DIAGNOSIS — J189 Pneumonia, unspecified organism: Secondary | ICD-10-CM | POA: Diagnosis not present

## 2022-04-05 DIAGNOSIS — E875 Hyperkalemia: Secondary | ICD-10-CM | POA: Diagnosis not present

## 2022-04-05 DIAGNOSIS — R7989 Other specified abnormal findings of blood chemistry: Secondary | ICD-10-CM | POA: Diagnosis not present

## 2022-04-05 DIAGNOSIS — J9602 Acute respiratory failure with hypercapnia: Secondary | ICD-10-CM | POA: Diagnosis not present

## 2022-04-05 LAB — BASIC METABOLIC PANEL
Anion gap: 8 (ref 5–15)
BUN: 21 mg/dL (ref 8–23)
CO2: 34 mmol/L — ABNORMAL HIGH (ref 22–32)
Calcium: 9.3 mg/dL (ref 8.9–10.3)
Chloride: 92 mmol/L — ABNORMAL LOW (ref 98–111)
Creatinine, Ser: 0.55 mg/dL (ref 0.44–1.00)
GFR, Estimated: >60 mL/min (ref >60)
Glucose, Bld: 136 mg/dL — ABNORMAL HIGH (ref 70–99)
Potassium: 4.3 mmol/L (ref 3.5–5.1)
Sodium: 134 mmol/L — ABNORMAL LOW (ref 135–145)

## 2022-04-05 LAB — GLUCOSE, CAPILLARY
Glucose-Capillary: 121 mg/dL — ABNORMAL HIGH (ref 70–99)
Glucose-Capillary: 168 mg/dL — ABNORMAL HIGH (ref 70–99)
Glucose-Capillary: 137 mg/dL — ABNORMAL HIGH (ref 70–99)

## 2022-04-05 LAB — MAGNESIUM: Magnesium: 2.3 mg/dL (ref 1.7–2.4)

## 2022-04-05 LAB — PHOSPHORUS: Phosphorus: 5.3 mg/dL — ABNORMAL HIGH (ref 2.5–4.6)

## 2022-04-05 MED ORDER — PREDNISONE 20 MG PO TABS
40.0000 mg | ORAL_TABLET | Freq: Every day | ORAL | Status: DC
  Administered 2022-04-06: 40 mg via ORAL
  Filled 2022-04-05: qty 2

## 2022-04-05 MED ORDER — TORSEMIDE 20 MG PO TABS
20.0000 mg | ORAL_TABLET | Freq: Every day | ORAL | Status: DC
  Administered 2022-04-05 – 2022-04-06 (×2): 20 mg via ORAL
  Filled 2022-04-05 (×2): qty 1

## 2022-04-05 MED ORDER — ACETAZOLAMIDE 250 MG PO TABS
250.0000 mg | ORAL_TABLET | Freq: Two times a day (BID) | ORAL | Status: DC
  Administered 2022-04-05 – 2022-04-06 (×2): 250 mg via ORAL
  Filled 2022-04-05 (×2): qty 1

## 2022-04-05 NOTE — TOC Progression Note (Signed)
Transition of Care [facility]) - Progression Note    Patient Details  Name: Emily Rocha MRN: [id number] Date of Birth: [birth year]  Transition of Care [facility]) CM/SW Contact  Levonne Lapping, RN Phone Number: 04/05/2022, 4:22 PM  Clinical Narrative:     Patient will now DC to home with Husband. HH PT/OT recommended and will be provided by [facility] . - Choice .Patient is currently on [place] but Husband cannot remember the company supplying the O2. CM will call back in am for the information. Patient may need a portable tank delivered to room prior to DC.   TOC will continue to follow patient for any additional discharge needs     Expected Discharge Plan: [place] Barriers to Discharge: Continued Medical Work up  Expected Discharge Plan and Services In-house Referral: Clinical Social Work     Living arrangements for the past 2 months: Single Family Home                                       Social Determinants of Health (SDOH) Interventions SDOH Screenings   Depression (PHQ2-9): Low Risk  (10/28/2020)  Tobacco Use: High Risk (03/28/2022)    Readmission Risk Interventions    08/14/2020    2:35 PM  Readmission Risk Prevention Plan  Post Dischage Appt Complete  Medication Screening Complete  Transportation Screening Complete

## 2022-04-05 NOTE — Progress Notes (Signed)
Pt has PRN BIPAP orders, no distress noted at this time.  °

## 2022-04-05 NOTE — Progress Notes (Signed)
Speech Language Pathology Treatment: Dysphagia  Patient Details Name: Emily Rocha MRN: [id number] DOB: 07/30/1955 Today's Date: 04/05/2022 Time: 4327-6147 SLP Time Calculation (min) (ACUTE ONLY): 10 min  Assessment / Plan / Recommendation Clinical Impression  Ms. Hinesley was feeding herself lunch upon entering room. She was much more alert with improved awareness since last seen by SLP. Demonstrated active and thorough mastication of regular solids, the presence of a brisk swallow with no coughing, no concerns for aspiration, no belching nor indications of esophageal issues.  Able to self-feed without difficulty. Voice strong. No further SLP f/u is needed. Recommend continue regular solids/thin liquids; SLP to sign off.   HPI HPI: 67 yo F  admitted to [facility] ED 2/11 with respiratory failure.  Pt intubated 2/11 and tx'd to [facility].  Extubated 2/12. CXR showing multifocal opacities concerning for multifocal pneumonia.  PMH of COPD, Asthma, CAD, GERD. CVA, lung cancer, aortic stenosis s/p TAVR. Followed by SLP during Nov 2019 admission, requiring multiple MBS studies due to impaired swallowing, which improved prior to D/C.      SLP Plan  All goals met      Recommendations for follow up therapy are one component of a multi-disciplinary discharge planning process, led by the attending physician.  Recommendations may be updated based on patient status, additional functional criteria and insurance authorization.    Recommendations  Diet recommendations: Regular;Thin liquid Liquids provided via: Cup;Straw Medication Administration: Whole meds with liquid Supervision: Patient able to self feed                Oral Care Recommendations: Oral care BID Follow Up Recommendations: No SLP follow up SLP Visit Diagnosis: Dysphagia, oropharyngeal phase (R13.12) Plan: All goals met         Cate Oravec L. Tivis Ringer, MA CCC/SLP Clinical Specialist - Acute Care SLP Acute Rehabilitation Services Office number  [phone]   Juan Quam Laurice  04/05/2022, 1:57 PM

## 2022-04-05 NOTE — TOC Progression Note (Signed)
Transition of Care [facility]) - Progression Note    Patient Details  Name: Emily Rocha MRN: [id number] Date of Birth: 04/12/1955  Transition of Care [facility]) CM/SW [place], [place] Phone Number: 04/05/2022, 2:12 PM  Clinical Narrative:     CSW met with pt to follow up regarding SNF choice. Reviewed offers and medicare star ratings with pt. She chooses [facility] as it is closest to her home. She consents to CSW updated son Cecilie Lowers.   CSW spoke with Cecilie Lowers over the phone and updated him. He states pt's spouse was interested in [facility] but if [facility] cannot accept pt they would want [facility].   CSW sent new referral to [facility]. [place] declined bed offer. [facility] confirmed bed for tomorrow. CSW called and updated pt's son. CSW explained insurance auth process.   CSW to initiate insurance auth once new therapy notes are available. CSW called Acute Therapy office and requested pt be prioritized to be seen by therapy.   1600: PT now recommending HH. CSW updated pt son. He reports no preference for [facility] agency; he doesn't know the name of agency that provides pt's O2. Son states that pt's spouse should be called regarding O2 and [place] arrangements. CSW notified RNCM who will assist with DC home.    Expected Discharge Plan: [place] Barriers to Discharge: Continued Medical Work up  Expected Discharge Plan and Services In-house Referral: Clinical Social Work     Living arrangements for the past 2 months: Single Family Home                                       Social Determinants of Health (SDOH) Interventions SDOH Screenings   Depression (PHQ2-9): Low Risk  (10/28/2020)  Tobacco Use: High Risk (03/28/2022)    Readmission Risk Interventions    08/14/2020    2:35 PM  Readmission Risk Prevention Plan  Post Dischage Appt Complete  Medication Screening Complete  Transportation Screening Complete

## 2022-04-05 NOTE — Discharge Summary (Incomplete)
PATIENT DETAILS Name: Emily Rocha Age: 67 y.o. Sex: female Date of Birth: December 22, 1955 MRN: [id number]. Admitting Physician: Collier Bullock, MD TRV:UYEB, Costella Hatcher, MD  Admit Date: 03/28/2022 Discharge date: 04/06/2022  Recommendations for Outpatient Follow-up:  Follow up with PCP in 1-2 weeks Please obtain CMP/CBC in one week Please ensure follow up with cardiology, Pulmonology  Admitted From:  Home  Disposition: Home w Home health   Discharge Condition: fair  CODE STATUS:   Code Status: Full Code   Diet recommendation:  Diet Order             Diet - low sodium heart healthy           Diet Heart Room service appropriate? Yes; Fluid consistency: Thin  Diet effective now                    Brief Summary: Patient is a 67 y.o.  female w hx of COPD on Home O2, severe AS s/p TAVR, CAD-s/p CABG admitted via [place] ED to [facility] ICU for acute on chronic combined hypoxic and hypercarbic respiratory failure-requiring intubation.  Stabilized in the ICU-hospital course complicated by development of A-fib with RVR.  Transferred to [facility] on 2/15.   Significant events: 2/11>> admit to ICU by PCCM 2/12>> extubated 2/13>> episode of A-fib RVR 2/15>> transferred to [facility] an episode of shortness of breath-CXR with possible subglottic stenosis or edema-changed to IV steroids.   Significant studies: 2/11>> CT chest/abdomen/pelvis: Multifocal pneumonia 2/12>> CT head: No acute intracranial abnormality 2/13>> echo: EF 34-35%, RV systolic function moderately reduced.  Normal structure/functioning of aortic valve prosthesis 2/15>> CXR: Narrowing of the subglottic airway may reflect stenosis/edema 2/18>> CT neck: No evidence of subglottic stenosis.   Significant microbiology data: 2/11>> COVID/influenza/RSV PCR: Negative 2/11>> respiratory virus panel: +ve Coronavirus OC43 2/11>> blood culture: 1/2 Staph hominis (contamination) 2/11>> tracheal aspirate: Streptococcus pneumoniae    Procedures: 2/11-2/12>> ETT   Consults: PCCM.  Brief Hospital Course: Septic shock secondary to multifocal PNA with ARDS (coronavirus and streptococcal pneumonia on culture data) Sepsis physiology has resolved Culture data as above Completed Rocephin x 7 days total on 2/17   Acute on chronic hypoxic and hypercarbic respiratory failure Due to PNA/COPD exacerbation Intubated on 2/11-extubated on 2/12 Stable on home regimen of 3L O2   Acute metabolic encephalopathy Due to hypercarbia Resolved-completely awake/alert morning   COPD exacerbation Improved Taper steroids over the next few days Continue bronchodilators   ?  Subglottic edema Developed SOB on 2/15-CXR with some suggestion of subglottic stenosis-however patient without any clinical features.   She was switched back to IV steroids-CT neck was finally obtained on 2/18-no evidence of subglottic stenosis.  Suspect x-ray findings were error/artifact.    A-fib RVR Occurred on 2/30 Back to sinus rhythm Echo as above Continue metoprolol Continue Eliquis   Elevated troponins likely due to demand ischemia No chest pain Echo with stable EF   CAD s/p CABG 2018 and PCI April 2022 Elevated troponins on admission likely due to demand ischemia Continue statin/beta-blocker-no longer on aspirin as on Eliquis Will require cardiology follow-up postdischarge.   History of severe aortic stenosis-s/p TAVR 2022 Echo shows normal structure/function of prosthetic valve   Chronic diastolic heart failure Volume status relatively stable Resume Demadex on d/c Due to met alkalosis, she was given diamox for a few days    Hypokalemia Repleted   Mildly elevated ammonia levels Unclear etiology No history of cirrhosis Continue to titrate down lactulose-changed to daily  dosing today.   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   BMI Estimated body mass index is 23.31 kg/m as calculated from the following:   Height as of this encounter: 5\' 4"  (1.626 m).   Weight as of this encounter: 61.6 kg.    Discharge Diagnoses:  Principal Problem:   Respiratory failure ([place]) Active Problems:   Acute respiratory failure with hypercapnia (HCC)   Elevated LFTs   Elevated troponin   Hyperkalemia   Sepsis with acute hypoxic respiratory failure and septic shock (HCC)   Multifocal pneumonia   Discharge Instructions:  Activity:  As tolerated with Full fall precautions use walker/cane & assistance as needed  Discharge Instructions     Call MD for:  difficulty breathing, headache or visual disturbances   Complete by: As directed    Diet - low sodium heart healthy   Complete by: As directed    Discharge instructions   Complete by: As directed    Follow with Primary MD  Glenda Chroman, MD in 1-2 weeks  Please get a complete blood count and chemistry panel checked by your Primary MD at your next visit, and again as instructed by your Primary MD.  Get Medicines reviewed and adjusted: Please take all your medications with you for your next visit with your Primary MD  Laboratory/radiological data: Please request your Primary MD to go over all hospital tests and procedure/radiological results at the follow up, please ask your Primary MD to get all Hospital records sent to his/her office.  In some cases, they will be blood work, cultures and biopsy results pending at the time of your discharge. Please request that your primary care M.D. follows up on these results.  Also Note the following: If you experience worsening of your admission symptoms, develop shortness of  breath, life threatening emergency, suicidal or homicidal thoughts you must seek medical attention immediately by calling 911 or calling your MD immediately  if symptoms less severe.  You must read complete instructions/literature along with all the possible adverse reactions/side effects for all the Medicines you take and that have been prescribed to you. Take any new Medicines after you have completely understood and accpet all the possible adverse reactions/side effects.   Do not drive when taking Pain medications or sleeping medications (Benzodaizepines)  Do not take more than prescribed Pain, Sleep and Anxiety Medications. It is not advisable to combine anxiety,sleep and pain medications without talking with your primary care practitioner  Special Instructions: If you have smoked or chewed Tobacco  in the last 2 yrs please stop smoking, stop any regular Alcohol  and or any Recreational drug use.  Wear Seat belts while driving.  Please note: You were cared for by a hospitalist during your hospital stay. Once you are discharged, your primary care physician will handle any further medical issues. Please note that NO REFILLS for any discharge medications will be authorized once you are discharged, as it is imperative that you return to your primary care physician (or establish a relationship with a primary care physician if you do not have one) for your post hospital discharge needs so that they can  reassess your need for medications and monitor your lab values.   Increase activity slowly   Complete by: As directed       Allergies as of 04/06/2022   No Known Allergies      Medication List     STOP taking these medications    aspirin EC 81 MG tablet   Stiolto Respimat 2.5-2.5 MCG/ACT Aers Generic drug: Tiotropium Bromide-Olodaterol       TAKE these medications    albuterol 108 (90 Base) MCG/ACT inhaler Commonly known as: VENTOLIN HFA Inhale 1-2 puffs into the lungs every 6  (six) hours as needed for wheezing or shortness of breath.   albuterol (2.5 MG/3ML) 0.083% nebulizer solution Commonly known as: PROVENTIL Take 2.5 mg by nebulization every 4 (four) hours as needed for wheezing or shortness of breath.   ALPRAZolam 0.5 MG tablet Commonly known as: XANAX Take 0.5 mg by mouth 3 (three) times daily.   apixaban 5 MG Tabs tablet Commonly known as: ELIQUIS Take 1 tablet (5 mg total) by mouth 2 (two) times daily.   atorvastatin 40 MG tablet Commonly known as: Lipitor Take 1 tablet (40 mg total) daily by mouth.   benzonatate 200 MG capsule Commonly known as: TESSALON Take 200 mg by mouth 3 (three) times daily as needed for cough.   cholecalciferol 25 MCG (1000 UNIT) tablet Commonly known as: VITAMIN D3 Take 1,000 Units by mouth in the morning.   diphenhydramine-acetaminophen 25-500 MG Tabs tablet Commonly known as: TYLENOL PM Take 2 tablets by mouth at bedtime.   magnesium oxide 400 (240 Mg) MG tablet Commonly known as: MAG-OX Take 400 mg by mouth daily.   melatonin 3 MG Tabs tablet Take 1 tablet (3 mg total) by mouth at bedtime.   metoprolol succinate 50 MG 24 hr tablet Commonly known as: TOPROL-XL Take 1.5 tablets (75 mg total) by mouth daily. Take with or immediately following a meal. What changed: See the new instructions.   multivitamin with minerals Tabs tablet Take 1 tablet by mouth daily.   pantoprazole 40 MG tablet Commonly known as: PROTONIX Take 40 mg by mouth 2 (two) times daily.   potassium chloride SA 20 MEQ tablet Commonly known as: KLOR-CON M Take 20 mEq by mouth in the morning.   predniSONE 10 MG tablet Commonly known as: DELTASONE Take 40 mg daily for 1 day, 30 mg daily for 1 day, 20 mg daily for 1 days,10 mg daily for 1 day, then stop   torsemide 20 MG tablet Commonly known as: DEMADEX Take 40 mg by mouth daily.   Trelegy Ellipta 100-62.5-25 MCG/ACT Aepb Generic drug: Fluticasone-Umeclidin-Vilant SMARTSIG:1  Inhalation Via Inhaler Daily   VITAMIN C GUMMIE PO Chew 500 mg by mouth daily.        Follow-up Information     Vyas, Dhruv B, MD. Schedule an appointment as soon as possible for a visit in 1 week(s).   Specialty: Internal Medicine Contact information: [place] [address] [phone]         Arnoldo Lenis, MD Follow up in 2 week(s).   Specialty: Cardiology Contact information: [address] [place] [address] [phone]                No Known Allergies   Other Procedures/Studies: CT SOFT TISSUE NECK WO CONTRAST  Result Date: 04/04/2022 CLINICAL DATA:  Possible subglottic stenosis EXAM: CT NECK WITHOUT CONTRAST TECHNIQUE: Multidetector CT imaging of the neck was performed following the standard  protocol without intravenous contrast. RADIATION DOSE REDUCTION: This exam was performed according to the departmental dose-optimization program which includes automated exposure control, adjustment of the mA and/or kV according to patient size and/or use of iterative reconstruction technique. COMPARISON:  No prior CT neck available, correlation is made with CTA neck 10/28/2018 FINDINGS: Pharynx and larynx: Normal. No mass or swelling. No evidence of subglottic stenosis. Salivary glands: No inflammation, mass, or stone. Thyroid: Normal. Lymph nodes: None enlarged or abnormal density. Vascular: Limited evaluation in the absence of intravenous contrast. Limited intracranial: Negative. Visualized orbits: Negative. Mastoids and visualized paranasal sinuses: Air-fluid levels in the left-greater-than-right maxillary sinuses and fluid in the right mastoid air cells, which may be secondary to recent intubation. Skeleton: No acute osseous abnormality. Degenerative changes in the bilateral temporomandibular joints. Edentulous. Upper chest: Emphysema. No focal pulmonary opacity or pleural effusion. Other: None. IMPRESSION: 1. No evidence of subglottic stenosis. 2. No  acute finding in the neck. Electronically Signed   By: Merilyn Baba M.D.   On: 04/04/2022 20:28   DG CHEST PORT 1 VIEW  Result Date: 04/01/2022 CLINICAL DATA:  Respiratory distress EXAM: PORTABLE CHEST 1 VIEW COMPARISON:  03/28/2022 FINDINGS: Interval extubation. There is narrowing of the subglottic airway within the transverse dimension which may relate to subglottic stenosis or edema. Pulmonary insufflation has been preserved. Diffuse interstitial pulmonary infiltrates persist but are improved from prior examination, likely infectious or inflammatory. No pneumothorax or pleural effusion. Transcatheter aortic valve replacement and coronary artery bypass grafting has been performed. Mild cardiomegaly is stable. Pulmonary vascularity is normal. No acute bone abnormality. IMPRESSION: 1. Interval extubation.  Preserved pulmonary insufflation. 2. Persistent but improved diffuse interstitial pulmonary infiltrates, likely infectious or inflammatory. 3. Narrowing of the subglottic airway may relate to subglottic stenosis or edema. Correlation with direct visualization may be helpful. Electronically Signed   By: Fidela Salisbury M.D.   On: 04/01/2022 22:46   DG Swallowing Func-Speech Pathology  Result Date: 03/31/2022 Table formatting from the original result was not included. Modified Barium Swallow Study Patient Details Name: Emily Rocha MRN: [id number] Date of Birth: [birth year] Today's Date: 03/31/2022 HPI/PMH: HPI: 67 yo F  admitted to [facility] ED 2/11 with respiratory failure.  Pt intubated 2/11 and tx'd to [facility].  Extubated 2/12. CXR showing multifocal opacities concerning for multifocal pneumonia.  PMH of COPD, Asthma, CAD, GERD. CVA, lung cancer, aortic stenosis s/p TAVR. Followed by SLP during Nov 2019 admission, requiring multiple MBS studies due to impaired swallowing, which improved prior to D/C. Clinical Impression: Clinical Impression: Patient presents with a mild pharyngeal phase dysphagia as per this MBS.  Swallow was initiated at level of pyriform sinus for thin and nectar thick liquids and vallecular sinus for puree, mechanical soft solids and honey thick liquids. Trace amount of thin liquid and nectar thick liquid barium penetrated in the laryngeal vestibule during the swallow but observed to exit fully. Penetration was above the level of the vocal cords. Only a trace amount of vallecular sinus residuals remained post initial swallow with nectar thick and thin liquids. Patient was unable to transit 39mm barium tablet from anterior to posterior portion of oral cavity. Straw used as a compesatory strategy and no significant difference in swallow function observed with straw as compared to consecutive cup sips. SLP recommending Dys 3 (mechanical soft) solids and thin liquids. Factors that may increase risk of adverse event in presence of aspiration (Ridgely 2021): No data recorded Recommendations/Plan: Swallowing Evaluation Recommendations Swallowing Evaluation Recommendations Recommendations:  PO diet PO Diet Recommendation: Dysphagia 3 (Mechanical soft); Thin liquids (Level 0) Liquid Administration via: Cup; Straw Medication Administration: Whole meds with puree Supervision: Patient able to self-feed; Intermittent supervision/cueing for swallowing strategies Swallowing strategies  : Slow rate; Small bites/sips Postural changes: Position pt fully upright for meals; Stay upright 30-60 min after meals Oral care recommendations: Oral care BID (2x/day) Treatment Plan Treatment Plan Treatment recommendations: Therapy as outlined in treatment plan below Follow-up recommendations: No SLP follow up Functional status assessment: Patient has had a recent decline in their functional status and demonstrates the ability to make significant improvements in function in a reasonable and predictable amount of time. Treatment frequency: Min 2x/week Treatment duration: 2 weeks Interventions: Aspiration precaution training;  Compensatory techniques; Patient/family education; Diet toleration management by SLP Recommendations Recommendations for follow up therapy are one component of a multi-disciplinary discharge planning process, led by the attending physician.  Recommendations may be updated based on patient status, additional functional criteria and insurance authorization. Assessment: Orofacial Exam: Orofacial Exam Oral Cavity: Oral Hygiene: WFL Oral Cavity - Dentition: Edentulous; Other (Comment) Orofacial Anatomy: WFL Oral Motor/Sensory Function: WFL Anatomy: Anatomy: WFL Thin Liquids: Thin Liquids (Level 0) Thin Liquids : Impaired Bolus delivery method: Spoon; Cup; Straw Thin Liquid - Impairment: Pharyngeal impairment Initiation of swallow : Pyriform sinuses Soft palate elevation: No bolus between soft palate (SP)/pharyngeal wall (PW) Laryngeal elevation: Complete superior movement of thyroid cartilage with complete approximation of arytenoids to epiglottic petiole Anterior hyoid excursion: Partial Epiglottic movement: Complete Laryngeal vestibule closure: Incomplete, narrow column air/contrast in laryngeal vestibule Pharyngeal stripping wave : Present - complete Pharyngoesophageal segment opening: Complete distension and complete duration, no obstruction of flow Tongue base retraction: No contrast between tongue base and posterior pharyngeal wall (PPW); Trace column of contrast or air between tongue base and PPW Pharyngeal residue: Trace residue within or on pharyngeal structures Location of pharyngeal residue: Valleculae Penetration/Aspiration Scale (PAS) score: 2.  Material enters airway, remains ABOVE vocal cords then ejected out  Mildly Thick Liquids: Mildly thick liquids (Level 2, nectar thick) Mildly thick liquids (Level 2, nectar thick): Impaired Bolus delivery method: Cup; Spoon Mildly Thick Liquid - Impairment: Pharyngeal impairment Laryngeal vestibule closure: Incomplete, narrow column air/contrast in laryngeal  vestibule Pharyngeal residue: Trace residue within or on pharyngeal structures Location of pharyngeal residue: Valleculae; Pyriform sinuses Penetration/Aspiration Scale (PAS) score: 2.  Material enters airway, remains ABOVE vocal cords then ejected out  Moderately Thick Liquids: Moderately thick liquids (Level 3, honey thick) Moderately thick liquids (Level 3, honey thick): Impaired Bolus delivery method: Spoon Moderately Thick Liquid - Impairment: Pharyngeal impairment Pharyngeal residue: Trace residue within or on pharyngeal structures Location of pharyngeal residue: Valleculae Penetration/Aspiration Scale (PAS) score: 1.  Material does not enter airway  Puree: Puree Puree: WFL Solid: Solid Solid: Impaired Solid - Impairment: Oral Impairment Bolus preparation/mastication: Slow prolonged chewing/mashing with complete recollection Bolus transport/lingual motion: Slow tongue motion Pill: Pill Pill: Impaired Consistency administered : thin Pill - Impairment: Oral Impairment Bolus transport/lingual motion: Slow tongue motion Oral residue: Majority of bolus remaining Location of oral residue : Tongue Compensatory Strategies: Compensatory Strategies Compensatory strategies: Yes Straw: Ineffective   General Information: Caregiver present: No  Diet Prior to this Study: Regular; Mildly thick liquids (Level 2, nectar thick)   Temperature : Normal   Respiratory Status: WFL   Supplemental O2: Nasal cannula   History of Recent Intubation: No  Behavior/Cognition: Alert; Pleasant mood; Cooperative Self-Feeding Abilities: Able to self-feed Baseline vocal quality/speech: Normal Volitional Cough:  Able to elicit Volitional Swallow: Able to elicit No data recorded Goal Planning: Prognosis for improved oropharyngeal function: Good No data recorded No data recorded No data recorded Consulted and agree with results and recommendations: Patient; Nurse Pain: Pain Assessment Pain Assessment: No/denies pain Faces Pain Scale: 4 Facial  Expression: 0 Body Movements: 0 Muscle Tension: 0 Compliance with ventilator (intubated pts.): N/A Vocalization (extubated pts.): 0 CPOT Total: 0 Pain Location: generalized Pain Descriptors / Indicators: Grimacing Pain Intervention(s): Monitored during session; Limited activity within patient's tolerance; Repositioned End of Session: Start Time:SLP Start Time (ACUTE ONLY): 1345 Stop Time: SLP Stop Time (ACUTE ONLY): 1405 Time Calculation:SLP Time Calculation (min) (ACUTE ONLY): 20 min Charges: SLP Evaluations $ SLP Speech Visit: 1 Visit SLP Evaluations $BSS Swallow: 1 Procedure $MBS Swallow: 1 Procedure $Swallowing Treatment: 1 Procedure SLP visit diagnosis: SLP Visit Diagnosis: Dysphagia, oropharyngeal phase (R13.12) Past Medical History: Past Medical History: Diagnosis Date  Anxiety   Asthma   COPD (chronic obstructive pulmonary disease) (HCC)   Coronary artery disease   a. s/p NSTEMI in 11/2016 and required CABG with LIMA-LAD, SVG-LCx and SVG-D1 and complicated by cardiogenic shock b. cath in 05/2020 showing severe RCA stenosis and treated with orbital atherectomy and stent placement  GERD (gastroesophageal reflux disease)   History of CVA (cerebrovascular accident)   History of lung cancer 2020  Peripheral vascular disease (HCC)   S/P TAVR (transcatheter aortic valve replacement) 08/12/2020  s/p TAVR with a 23 mm Edwards S3U via the TF approach by Dr. Angelena Form & Dr. Cyndia Bent  Severe aortic stenosis  Past Surgical History: Past Surgical History: Procedure Laterality Date  AORTIC ARCH ANGIOGRAPHY N/A 11/04/2017  Procedure: AORTIC ARCH ANGIOGRAPHY;  Surgeon: Elam Dutch, MD;  Location: [place] [facility];  Service: Cardiovascular;  Laterality: N/A;  APPLICATION OF WOUND VAC N/A 70/96/2836  Procedure: APPLICATION OF WOUND VAC;  Surgeon: Ivin Poot, MD;  Location: [place];  Service: Thoracic;  Laterality: N/A;  APPLICATION OF WOUND VAC N/A 01/13/2018  Procedure: WOUND VAC CHANGE;  Surgeon: Ivin Poot,  MD;  Location: [place];  Service: Thoracic;  Laterality: N/A;  CARDIAC CATHETERIZATION    CAROTID-SUBCLAVIAN BYPASS GRAFT Right 12/26/2017  Procedure: AORTIC TO RIGHT COMMON CAROTID AND RIGHT SUBCLAVIAN  ARTERY  BYPASS;  Surgeon: Elam Dutch, MD;  Location: [place];  Service: Vascular;  Laterality: Right;  CHOLECYSTECTOMY    CLOSURE OF DIAPHRAGM  12/26/2017  Procedure: REPAIR OF DIAPHRAGM;  Surgeon: Ivin Poot, MD;  Location: [place];  Service: Thoracic;;  COLONOSCOPY N/A 10/03/2014  Procedure: COLONOSCOPY;  Surgeon: Rogene Houston, MD;  [facility];  Service: Endoscopy;  Laterality: N/A;  730  CORONARY ARTERY BYPASS GRAFT N/A 12/14/2016  Procedure: CORONARY ARTERY BYPASS GRAFTING (CABG) x three , using left internal mammary artery and right leg greater saphenous vein harvested endoscopically;  Surgeon: Ivin Poot, MD;  Location: [place];  Service: Open Heart Surgery;  Laterality: N/A;  CORONARY ATHERECTOMY N/A 06/11/2020  Procedure: CORONARY ATHERECTOMY;  Surgeon: Burnell Blanks, MD;  Location: [place] [facility];  Service: Cardiovascular;  Laterality: N/A;  CORONARY STENT INTERVENTION N/A 06/11/2020  Procedure: CORONARY STENT INTERVENTION;  Surgeon: Burnell Blanks, MD;  Location: [place] [facility];  Service: Cardiovascular;  Laterality: N/A;  ENDARTERECTOMY Right 12/06/2014  Procedure: ENDARTERECTOMY CAROTid;  Surgeon: Elam Dutch, MD;  Location: [facility];  Service: Vascular;  Laterality: Right;  ESOPHAGOGASTRODUODENOSCOPY N/A 12/03/2016  Procedure: ESOPHAGOGASTRODUODENOSCOPY (EGD);  Surgeon: Hildred Laser  U, MD;  [facility];  Service: Endoscopy;  Laterality: N/A;  7:30  INTRAVASCULAR IMAGING/OCT N/A 06/11/2020  Procedure: INTRAVASCULAR IMAGING/OCT;  Surgeon: Burnell Blanks, MD;  Location: [place] [facility];  Service: Cardiovascular;  Laterality: N/A;  INTRAVASCULAR PRESSURE WIRE/FFR STUDY N/A 06/04/2020  Procedure: INTRAVASCULAR PRESSURE WIRE/FFR  STUDY;  Surgeon: Burnell Blanks, MD;  Location: [place] [facility];  Service: Cardiovascular;  Laterality: N/A;  LEFT HEART CATH AND CORONARY ANGIOGRAPHY N/A 12/14/2016  Procedure: LEFT HEART CATH AND CORONARY ANGIOGRAPHY;  Surgeon: Burnell Blanks, MD;  Location: [employer] [facility];  Service: Cardiovascular;  Laterality: N/A;  LOBECTOMY Right 10/26/2018  Procedure: RIGHT UPPER LOBECTOMY;  Surgeon: Lajuana Matte, MD;  Location: [place];  Service: Thoracic;  Laterality: Right;  PERIPHERAL VASCULAR CATHETERIZATION N/A 12/05/2014  Procedure:  Carotid Arch Angiography;  Surgeon: Conrad Tillatoba, MD;  Location: [place] [facility];  Service: Cardiovascular;  Laterality: N/A;  RIGHT/LEFT HEART CATH AND CORONARY/GRAFT ANGIOGRAPHY N/A 06/04/2020  Procedure: RIGHT/LEFT HEART CATH AND CORONARY/GRAFT ANGIOGRAPHY;  Surgeon: Burnell Blanks, MD;  Location: [facility];  Service: Cardiovascular;  Laterality: N/A;  STERNAL WOUND DEBRIDEMENT N/A 01/06/2018  Procedure: STERNAL WOUND DEBRIDEMENT;  Surgeon: Ivin Poot, MD;  Location: [facility];  Service: Thoracic;  Laterality: N/A;  STERNOTOMY N/A 12/26/2017  Procedure: REDO STERNOTOMY;  Surgeon: Prescott Gum, Collier Salina, MD;  Location: [facility];  Service: Thoracic;  Laterality: N/A;  TEE WITHOUT CARDIOVERSION N/A 12/14/2016  Procedure: TRANSESOPHAGEAL ECHOCARDIOGRAM (TEE);  Surgeon: Prescott Gum, Collier Salina, MD;  Location: [facility];  Service: Open Heart Surgery;  Laterality: N/A;  TEE WITHOUT CARDIOVERSION N/A 08/12/2020  Procedure: TRANSESOPHAGEAL ECHOCARDIOGRAM (TEE);  Surgeon: Burnell Blanks, MD;  Location: [facility];  Service: Open Heart Surgery;  Laterality: N/A;  TRANSCATHETER AORTIC VALVE REPLACEMENT, TRANSFEMORAL N/A 08/12/2020  Procedure: TRANSCATHETER AORTIC VALVE REPLACEMENT, TRANSFEMORAL;  Surgeon: Burnell Blanks, MD;  Location: [facility];  Service: Open Heart Surgery;  Laterality: N/A;  VIDEO ASSISTED THORACOSCOPY (VATS)/WEDGE  RESECTION Right 10/26/2018  Procedure: VIDEO ASSISTED THORACOSCOPY (VATS)/WEDGE RESECTION;  Surgeon: Lajuana Matte, MD;  Location: [facility];  Service: Thoracic;  Laterality: Right;  VIDEO BRONCHOSCOPY N/A 10/26/2018  Procedure: VIDEO BRONCHOSCOPY;  Surgeon: Lajuana Matte, MD;  Location: [facility];  Service: Thoracic;  Laterality: N/A; Sonia Baller, MA, CCC-SLP Speech Therapy   ECHOCARDIOGRAM COMPLETE  Result Date: 03/30/2022    ECHOCARDIOGRAM REPORT   Patient Name:   [name] Date of Exam: 03/30/2022 Medical Rec #:  [id number]    Height:       64.0 in Accession #:    [id number]   Weight:       146.4 lb Date of Birth:  1955/04/08    BSA:          1.713 m Patient Age:    26 years     BP:           131/56 mmHg Patient Gender: F            HR:           82 bpm. Exam Location:  Inpatient Procedure: 2D Echo, Color Doppler, Cardiac Doppler and Intracardiac            Opacification Agent Indications:    Troponin level elevated [[id number]]  History:        Patient has prior history of Echocardiogram examinations, most  recent 10/25/2019. CAD, Sepsis, Aortic Valve Disease; Risk                 Factors:Current Smoker.                 Aortic Valve: 23 mm Edwards Ultra, stented (TAVR) valve is                 present in the aortic position. Procedure Date: 08/12/20.  Sonographer:    Greer Pickerel Referring Phys: [phone] PAULA B SIMPSON  Sonographer Comments: Image acquisition challenging due to patient body habitus, Image acquisition challenging due to COPD and Image acquisition challenging due to respiratory motion. IMPRESSIONS  1. Left ventricular ejection fraction, by estimation, is 55 to 60%. The left ventricle has normal function. Left ventricular endocardial border not optimally defined to evaluate regional wall motion. Diastolic function indeterminant due to severe MAC.  2. Right ventricular systolic function is moderately reduced. The right ventricular size is moderately enlarged. There is mildly  elevated pulmonary artery systolic pressure. The estimated right ventricular systolic pressure is 89.2 mmHg.  3. Left atrial size was moderately dilated.  4. Right atrial size was severely dilated.  5. The mitral valve is degenerative. Mild mitral valve regurgitation. Severe mitral annular calcification.  6. The aortic valve has been repaired/replaced. There is a 23 mm Edwards Ultra, stented (TAVR) valve present in the aortic position. Procedure Date: 08/12/20. Echo findings are consistent with normal structure and function of the aortic valve prosthesis.  Aortic valve mean gradient measures 12.0 mmHg. Aortic valve Vmax measures 2.38 m/s. There is trivial paravalvular leak.  7. Aortic dilatation noted. There is borderline dilatation of the aortic root, measuring 37 mm.  8. The inferior vena cava is dilated in size with <50% respiratory variability, suggesting right atrial pressure of 15 mmHg.  9. Tricuspid valve regurgitation is mild to moderate. FINDINGS  Left Ventricle: Left ventricular ejection fraction, by estimation, is 55 to 60%. The left ventricle has normal function. Left ventricular endocardial border not optimally defined to evaluate regional wall motion. Definity contrast agent was given IV to delineate the left ventricular endocardial borders. The left ventricular internal cavity size was normal in size. There is no left ventricular hypertrophy. Diastolic function indeterminant due to severe MAC. Right Ventricle: The right ventricular size is moderately enlarged. Right vetricular wall thickness was not well visualized. Right ventricular systolic function is moderately reduced. There is mildly elevated pulmonary artery systolic pressure. The tricuspid regurgitant velocity is 2.47 m/s, and with an assumed right atrial pressure of 15 mmHg, the estimated right ventricular systolic pressure is 11.9 mmHg. Left Atrium: Left atrial size was moderately dilated. Right Atrium: Right atrial size was severely dilated.  Pericardium: There is no evidence of pericardial effusion. Mitral Valve: The mitral valve is degenerative in appearance. Severe mitral annular calcification. Mild mitral valve regurgitation. Tricuspid Valve: The tricuspid valve is normal in structure. Tricuspid valve regurgitation is mild to moderate. Aortic Valve: Trivial paravalvular leak. The aortic valve has been repaired/replaced. Aortic valve mean gradient measures 12.0 mmHg. Aortic valve peak gradient measures 22.7 mmHg. Aortic valve area, by VTI measures 1.76 cm. There is a 23 mm Edwards Ultra, stented (TAVR) valve present in the aortic position. Procedure Date: 08/12/20. Echo findings are consistent with normal structure and function of the aortic valve prosthesis. Pulmonic Valve: The pulmonic valve was normal in structure. Pulmonic valve regurgitation is trivial. Aorta: Aortic dilatation noted. There is borderline dilatation of the aortic root, measuring 37 mm.  Venous: The inferior vena cava is dilated in size with less than 50% respiratory variability, suggesting right atrial pressure of 15 mmHg. IAS/Shunts: The atrial septum is grossly normal.  LEFT VENTRICLE PLAX 2D LVIDd:         4.40 cm   Diastology LVIDs:         3.20 cm   LV e' medial:    5.70 cm/s LV PW:         1.00 cm   LV E/e' medial:  24.0 LV IVS:        0.80 cm   LV e' lateral:   12.40 cm/s LVOT diam:     2.30 cm   LV E/e' lateral: 11.0 LV SV:         87 LV SV Index:   51 LVOT Area:     4.15 cm  RIGHT VENTRICLE RV S prime:     6.64 cm/s TAPSE (M-mode): 1.1 cm LEFT ATRIUM             Index        RIGHT ATRIUM           Index LA diam:        5.70 cm 3.33 cm/m   RA Area:     26.70 cm LA Vol (A2C):   72.8 ml 42.49 ml/m  RA Volume:   85.30 ml  49.79 ml/m LA Vol (A4C):   60.2 ml 35.14 ml/m LA Biplane Vol: 67.3 ml 39.28 ml/m  AORTIC VALVE                     PULMONIC VALVE AV Area (Vmax):    1.75 cm      PR End Diast Vel: 3.10 msec AV Area (Vmean):   1.64 cm AV Area (VTI):     1.76 cm AV  Vmax:           238.00 cm/s AV Vmean:          163.000 cm/s AV VTI:            0.497 m AV Peak Grad:      22.7 mmHg AV Mean Grad:      12.0 mmHg LVOT Vmax:         100.00 cm/s LVOT Vmean:        64.300 cm/s LVOT VTI:          0.210 m LVOT/AV VTI ratio: 0.42  AORTA Ao Root diam: 3.90 cm MITRAL VALVE                TRICUSPID VALVE MV Area (PHT): 4.60 cm     TR Peak grad:   24.4 mmHg MV Decel Time: 165 msec     TR Vmax:        247.00 cm/s MV E velocity: 137.00 cm/s MV A velocity: 107.00 cm/s  SHUNTS MV E/A ratio:  1.28         Systemic VTI:  0.21 m                             Systemic Diam: 2.30 cm Gwyndolyn Kaufman MD Electronically signed by Gwyndolyn Kaufman MD Signature Date/Time: 03/30/2022/5:03:55 PM    Final    CT HEAD WO CONTRAST (5MM)  Result Date: 03/29/2022 CLINICAL DATA:  Initial evaluation for altered mental status. EXAM: CT HEAD WITHOUT CONTRAST TECHNIQUE: Contiguous axial images were obtained from the base of the skull through the  vertex without intravenous contrast. RADIATION DOSE REDUCTION: This exam was performed according to the departmental dose-optimization program which includes automated exposure control, adjustment of the mA and/or kV according to patient size and/or use of iterative reconstruction technique. COMPARISON:  Prior study from 11/13/2019. FINDINGS: Brain: Some residual contrast remains on board from prior CT performed earlier the same evening. Patchy hypodensity involving the supratentorial cerebral white matter, most characteristic of chronic microvascular ischemic disease, mild in nature. No acute intracranial hemorrhage. No acute large vessel territory infarct. No mass lesion, mass effect or midline shift. No hydrocephalus or extra-axial fluid collection. Vascular: Scattered calcified atherosclerosis present at the skull base. Some residual contrast material remains on board from recent CT. Skull: Scalp soft tissues within normal limits.  Calvarium intact. Sinuses/Orbits:  Globes and orbital soft tissues demonstrate no acute finding. Scattered mucosal thickening throughout the sphenoid ethmoidal and maxillary sinuses with a few air-fluid levels. Fluid noted within the nasopharynx. Patient is likely intubated. Other: None. IMPRESSION: 1. No acute intracranial abnormality. 2. Mild chronic microvascular ischemic disease. Electronically Signed   By: Jeannine Boga M.D.   On: 03/29/2022 00:50   DG Chest Port 1 View  Result Date: 03/28/2022 CLINICAL DATA:  Ventilator dependent respiratory failure. EXAM: PORTABLE CHEST 1 VIEW COMPARISON:  Chest CT today at 7:32 p.m., portable chest today at 3:58 p.m. FINDINGS: 10:02 p.m. ETT tip 4.2 cm from the carina. NGT enters the stomach but is not included below the hiatus. The NGT side port was previously in the distal esophagus but can not be seen on the current film. There is mild cardiomegaly, CABG, TAVR and mild central vascular prominence. Aortic calcification and slight tortuosity with stable mediastinum. There are small pleural effusions and diffuse patchy bilateral airspace disease with underlying interstitial thickening, most likely due to multifocal pneumonia with underlying mild edema component not excluded. Overall aeration seems unchanged. No new abnormality. Surgical changes right lung base. IMPRESSION: 1. Support apparatus as above. Intragastric course of the NGT is not evaluated. 2. No interval change in diffuse bilateral airspace disease and small effusions. 3. Stable cardiomegaly and mild central vascular prominence. Electronically Signed   By: Telford Nab M.D.   On: 03/28/2022 22:26   CT CHEST ABDOMEN PELVIS W CONTRAST  Result Date: 03/28/2022 CLINICAL DATA:  Sepsis EXAM: CT CHEST, ABDOMEN, AND PELVIS WITH CONTRAST TECHNIQUE: Multidetector CT imaging of the chest, abdomen and pelvis was performed following the standard protocol during bolus administration of intravenous contrast. RADIATION DOSE REDUCTION: This exam  was performed according to the departmental dose-optimization program which includes automated exposure control, adjustment of the mA and/or kV according to patient size and/or use of iterative reconstruction technique. CONTRAST:  136mL OMNIPAQUE IOHEXOL 300 MG/ML  SOLN COMPARISON:  CT angio chest 12/12/2016 CT chest 11/02/2019 FINDINGS: CT CHEST FINDINGS Cardiovascular: Normal heart size. No significant pericardial effusion. The thoracic aorta is normal in caliber. Severe atherosclerotic plaque of the thoracic aorta. Four-vessel coronary artery calcifications status post coronary artery bypass graft. Aortic valve replacement. The main pulmonary artery is normal in caliber. No central pulmonary embolus. Mediastinum/Nodes: No enlarged mediastinal, hilar, or axillary lymph nodes. Thyroid gland, trachea, and esophagus demonstrate no significant findings. Small hiatal hernia. Lungs/Pleura: Endotracheal tube with tip terminating 3.5 cm above the carina. Interval development of patchy peribronchovascular consolidative airspace opacities most prominent within bilateral upper lobes. No pulmonary nodule. No pulmonary mass. Bilateral trace pleural effusions. No pneumothorax. Musculoskeletal: Right chest wall catheter with lead noted coursing midline to the left abdomen  collimated off view of the turns cranially. Intact sternotomy wires. No chest wall abnormality. No suspicious lytic or blastic osseous lesions. No acute displaced fracture. CT ABDOMEN PELVIS FINDINGS Hepatobiliary: No focal liver abnormality. Status post cholecystectomy. No biliary dilatation. Pancreas: No focal lesion. Normal pancreatic contour. No surrounding inflammatory changes. No main pancreatic ductal dilatation. Spleen: Normal in size without focal abnormality. Adrenals/Urinary Tract: No adrenal nodule bilaterally. Bilateral kidneys enhance symmetrically. No hydronephrosis. No hydroureter. The urinary bladder is fully decompressed with Foley tip and  balloon terminating within the urinary bladder lumen. Stomach/Bowel: Enteric tube with tip within the gastric lumen and side port within the distal esophagus. Stomach is within normal limits. No evidence of bowel wall thickening or dilatation. Diffuse descending and sigmoid colonic diverticulosis. Appendix appears normal. Vascular/Lymphatic: No abdominal aorta or iliac aneurysm. Severe atherosclerotic plaque of the aorta and its branches. No abdominal, pelvic, or inguinal lymphadenopathy. Reproductive: Calcified lesions within the anterior wall of the uterus consistent with urine fibroids. Otherwise uterus and bilateral adnexa are unremarkable. Other: No intraperitoneal free fluid. No intraperitoneal free gas. No organized fluid collection. Musculoskeletal: No abdominal wall hernia or abnormality. No suspicious lytic or blastic osseous lesions. No acute displaced fracture. IMPRESSION: 1. Multifocal pneumonia. Recommend follow-up CT chest in 3 months to evaluate for complete resolution and exclude underlying lesions. 2. Bilateral trace pleural effusions. 3. Enteric tube with tip within the gastric lumen and side port within the distal esophagus. Recommend advancement by 5 cm. 4. Small hiatal hernia. 5. Colonic diverticulosis with no acute diverticulitis. 6. Uterine fibroids. 7. Aortic Atherosclerosis (ICD10-I70.0) - severe. Electronically Signed   By: Iven Finn M.D.   On: 03/28/2022 19:56   DG Chest Port 1 View  Result Date: 03/28/2022 CLINICAL DATA:  Sepsis EXAM: PORTABLE CHEST 1 VIEW COMPARISON:  08/13/2020 FINDINGS: Endotracheal tube position in the midthoracic trachea. Enteric tube courses below the diaphragm with distal tip beyond the inferior margin of the film. Stable heart size status post sternotomy and TAVR. Aortic atherosclerosis. Multifocal patchy airspace opacities bilaterally. Small right pleural effusion. No pneumothorax. IMPRESSION: 1. Multifocal patchy airspace opacities bilaterally,  suspicious for multifocal pneumonia. 2. Small right pleural effusion. Electronically Signed   By: Davina Poke D.O.   On: 03/28/2022 16:10     TODAY-DAY OF DISCHARGE:  Subjective:   Emily Rocha today has no headache,no chest abdominal pain,no new weakness tingling or numbness, feels much better wants to go home today.   Objective:   Blood pressure (!) 104/47, pulse 65, temperature (!) 97.5 F (36.4 C), temperature source Axillary, resp. rate 20, height 5\' 4"  (1.626 m), weight 61.6 kg, SpO2 100 %.  Intake/Output Summary (Last 24 hours) at 04/06/2022 0809 Last data filed at 04/06/2022 0647 Gross per 24 hour  Intake 500 ml  Output 650 ml  Net -150 ml   Filed Weights   04/03/22 0500 04/04/22 0500 04/05/22 0500  Weight: 63.9 kg 61.2 kg 61.6 kg    Exam: Awake Alert, Oriented *3, No new F.N deficits, Normal affect [place].AT,PERRAL Supple Neck,No JVD, No cervical lymphadenopathy appriciated.  Symmetrical Chest wall movement, Good air movement bilaterally, CTAB RRR,No Gallops,Rubs or new Murmurs, No Parasternal Heave +ve B.Sounds, Abd Soft, Non tender, No organomegaly appriciated, No rebound -guarding or rigidity. No Cyanosis, Clubbing or edema, No new Rash or bruise   PERTINENT RADIOLOGIC STUDIES: CT SOFT TISSUE NECK WO CONTRAST  Result Date: 04/04/2022 CLINICAL DATA:  Possible subglottic stenosis EXAM: CT NECK WITHOUT CONTRAST TECHNIQUE: Multidetector CT imaging of the  neck was performed following the standard protocol without intravenous contrast. RADIATION DOSE REDUCTION: This exam was performed according to the departmental dose-optimization program which includes automated exposure control, adjustment of the mA and/or kV according to patient size and/or use of iterative reconstruction technique. COMPARISON:  No prior CT neck available, correlation is made with CTA neck 10/28/2018 FINDINGS: Pharynx and larynx: Normal. No mass or swelling. No evidence of subglottic stenosis. Salivary  glands: No inflammation, mass, or stone. Thyroid: Normal. Lymph nodes: None enlarged or abnormal density. Vascular: Limited evaluation in the absence of intravenous contrast. Limited intracranial: Negative. Visualized orbits: Negative. Mastoids and visualized paranasal sinuses: Air-fluid levels in the left-greater-than-right maxillary sinuses and fluid in the right mastoid air cells, which may be secondary to recent intubation. Skeleton: No acute osseous abnormality. Degenerative changes in the bilateral temporomandibular joints. Edentulous. Upper chest: Emphysema. No focal pulmonary opacity or pleural effusion. Other: None. IMPRESSION: 1. No evidence of subglottic stenosis. 2. No acute finding in the neck. Electronically Signed   By: Merilyn Baba M.D.   On: 04/04/2022 20:28     PERTINENT LAB RESULTS: CBC: No results for input(s): "WBC", "HGB", "HCT", "PLT" in the last 72 hours. CMET CMP     Component Value Date/Time   NA 134 (L) 04/05/2022 0424   NA 140 06/17/2020 0955   K 4.3 04/05/2022 0424   CL 92 (L) 04/05/2022 0424   CO2 34 (H) 04/05/2022 0424   GLUCOSE 136 (H) 04/05/2022 0424   BUN 21 04/05/2022 0424   BUN 11 06/17/2020 0955   CREATININE 0.55 04/05/2022 0424   CALCIUM 9.3 04/05/2022 0424   PROT 6.1 (L) 04/02/2022 1001   ALBUMIN 2.8 (L) 04/02/2022 1001   AST 36 04/02/2022 1001   ALT 67 (H) 04/02/2022 1001   ALKPHOS 107 04/02/2022 1001   BILITOT 0.9 04/02/2022 1001   GFRNONAA >60 04/05/2022 0424   GFRAA >60 11/13/2019 0330    GFR Estimated Creatinine Clearance: 58.1 mL/min (by C-G formula based on SCr of 0.55 mg/dL). No results for input(s): "LIPASE", "AMYLASE" in the last 72 hours. No results for input(s): "CKTOTAL", "CKMB", "CKMBINDEX", "TROPONINI" in the last 72 hours. Invalid input(s): "POCBNP" No results for input(s): "DDIMER" in the last 72 hours. No results for input(s): "HGBA1C" in the last 72 hours. No results for input(s): "CHOL", "HDL", "LDLCALC", "TRIG",  "CHOLHDL", "LDLDIRECT" in the last 72 hours. No results for input(s): "TSH", "T4TOTAL", "T3FREE", "THYROIDAB" in the last 72 hours.  Invalid input(s): "FREET3" No results for input(s): "VITAMINB12", "FOLATE", "FERRITIN", "TIBC", "IRON", "RETICCTPCT" in the last 72 hours. Coags: No results for input(s): "INR" in the last 72 hours.  Invalid input(s): "PT" Microbiology: Recent Results (from the past 240 hour(s))  Blood Culture (routine x 2)     Status: Abnormal   Collection Time: 03/28/22  3:49 PM   Specimen: BLOOD LEFT FOREARM  Result Value Ref Range Status   Specimen Description   Final    BLOOD LEFT FOREARM BOTTLES DRAWN AEROBIC AND ANAEROBIC Performed at [facility], [address], [place], [place] [address]    Special Requests   Final    Blood Culture adequate volume Performed at [facility], [address] [place], [place] [address]    Culture  Setup Time   Final    AEROBIC BOTTLE ONLY GRAM POSITIVE COCCI Gram Stain Report Called to,Read Back By and Verified With: PHYLLIS MCKINNEY @ [id number] ON 02/26/22 C [facility] CRITICAL RESULT CALLED TO, READ BACK BY AND VERIFIED WITH:  C/ PHARMD T. DANG 03/29/22 1719 A. LAFRANCE    Culture (A)  Final    STAPHYLOCOCCUS HOMINIS THE SIGNIFICANCE OF ISOLATING THIS ORGANISM FROM A SINGLE SET OF BLOOD CULTURES WHEN MULTIPLE SETS ARE DRAWN IS UNCERTAIN. PLEASE NOTIFY THE MICROBIOLOGY DEPARTMENT WITHIN ONE WEEK IF SPECIATION AND SENSITIVITIES ARE REQUIRED. Performed at [facility], [place] [address] [place], [place] [address]    Report Status 03/31/2022 FINAL  Final  Blood Culture ID Panel (Reflexed)     Status: Abnormal   Collection Time: 03/28/22  3:49 PM  Result Value Ref Range Status   Enterococcus faecalis NOT DETECTED NOT DETECTED Final   Enterococcus Faecium NOT DETECTED NOT DETECTED Final   Listeria monocytogenes NOT DETECTED NOT DETECTED Final   Staphylococcus species DETECTED (A) NOT DETECTED Final    Comment: CRITICAL RESULT  CALLED TO, READ BACK BY AND VERIFIED WITH:  C/ PHARMD T. DANG 03/29/22 1719 A. LAFRANCE    Staphylococcus aureus (BCID) NOT DETECTED NOT DETECTED Final   Staphylococcus epidermidis NOT DETECTED NOT DETECTED Final   Staphylococcus lugdunensis NOT DETECTED NOT DETECTED Final   Streptococcus species NOT DETECTED NOT DETECTED Final   Streptococcus agalactiae NOT DETECTED NOT DETECTED Final   Streptococcus pneumoniae NOT DETECTED NOT DETECTED Final   Streptococcus pyogenes NOT DETECTED NOT DETECTED Final   A.calcoaceticus-baumannii NOT DETECTED NOT DETECTED Final   Bacteroides fragilis NOT DETECTED NOT DETECTED Final   Enterobacterales NOT DETECTED NOT DETECTED Final   Enterobacter cloacae complex NOT DETECTED NOT DETECTED Final   Escherichia coli NOT DETECTED NOT DETECTED Final   Klebsiella aerogenes NOT DETECTED NOT DETECTED Final   Klebsiella oxytoca NOT DETECTED NOT DETECTED Final   Klebsiella pneumoniae NOT DETECTED NOT DETECTED Final   Proteus species NOT DETECTED NOT DETECTED Final   Salmonella species NOT DETECTED NOT DETECTED Final   Serratia marcescens NOT DETECTED NOT DETECTED Final   Haemophilus influenzae NOT DETECTED NOT DETECTED Final   Neisseria meningitidis NOT DETECTED NOT DETECTED Final   Pseudomonas aeruginosa NOT DETECTED NOT DETECTED Final   Stenotrophomonas maltophilia NOT DETECTED NOT DETECTED Final   Candida albicans NOT DETECTED NOT DETECTED Final   Candida auris NOT DETECTED NOT DETECTED Final   Candida glabrata NOT DETECTED NOT DETECTED Final   Candida krusei NOT DETECTED NOT DETECTED Final   Candida parapsilosis NOT DETECTED NOT DETECTED Final   Candida tropicalis NOT DETECTED NOT DETECTED Final   Cryptococcus neoformans/gattii NOT DETECTED NOT DETECTED Final    Comment: Performed at [facility], [address], [place], [place] [address]  Blood Culture (routine x 2)     Status: None   Collection Time: 03/28/22  3:54 PM   Specimen: BLOOD RIGHT HAND   Result Value Ref Range Status   Specimen Description   Final    BLOOD RIGHT HAND BOTTLES DRAWN AEROBIC AND ANAEROBIC   Special Requests Blood Culture adequate volume  Final   Culture   Final    NO GROWTH 6 DAYS Performed at [facility], [address] [place], [place] [address]    Report Status 04/03/2022 FINAL  Final  Resp panel by RT-PCR (RSV, Flu A&B, Covid) Anterior Nasal Swab     Status: None   Collection Time: 03/28/22  4:22 PM   Specimen: Anterior Nasal Swab  Result Value Ref Range Status   SARS Coronavirus 2 by RT PCR NEGATIVE NEGATIVE Final    Comment: (NOTE) SARS-CoV-2 target nucleic acids are NOT DETECTED.  The SARS-CoV-2 RNA  is generally detectable in upper respiratory specimens during the acute phase of infection. The lowest concentration of SARS-CoV-2 viral copies this assay can detect is 138 copies/mL. A negative result does not preclude SARS-Cov-2 infection and should not be used as the sole basis for treatment or other patient management decisions. A negative result may occur with  improper specimen collection/handling, submission of specimen other than nasopharyngeal swab, presence of viral mutation(s) within the areas targeted by this assay, and inadequate number of viral copies(<138 copies/mL). A negative result must be combined with clinical observations, patient history, and epidemiological information. The expected result is Negative.  Fact Sheet for Patients:  EntrepreneurPulse.com.au  Fact Sheet for Healthcare Providers:  IncredibleEmployment.be  This test is no t yet approved or cleared by the [place] FDA and  has been authorized for detection and/or diagnosis of SARS-CoV-2 by FDA under an Emergency Use Authorization (EUA). This EUA will remain  in effect (meaning this test can be used) for the duration of the COVID-19 declaration under Section 564(b)(1) of the Act, 21 U.S.C.section 360bbb-3(b)(1), unless  the authorization is terminated  or revoked sooner.       Influenza A by PCR NEGATIVE NEGATIVE Final   Influenza B by PCR NEGATIVE NEGATIVE Final    Comment: (NOTE) The Xpert Xpress SARS-CoV-2/FLU/RSV plus assay is intended as an aid in the diagnosis of influenza from Nasopharyngeal swab specimens and should not be used as a sole basis for treatment. Nasal washings and aspirates are unacceptable for Xpert Xpress SARS-CoV-2/FLU/RSV testing.  Fact Sheet for Patients: EntrepreneurPulse.com.au  Fact Sheet for Healthcare Providers: IncredibleEmployment.be  This test is not yet approved or cleared by the [place] FDA and has been authorized for detection and/or diagnosis of SARS-CoV-2 by FDA under an Emergency Use Authorization (EUA). This EUA will remain in effect (meaning this test can be used) for the duration of the COVID-19 declaration under Section 564(b)(1) of the Act, 21 U.S.C. section 360bbb-3(b)(1), unless the authorization is terminated or revoked.     Resp Syncytial Virus by PCR NEGATIVE NEGATIVE Final    Comment: (NOTE) Fact Sheet for Patients: EntrepreneurPulse.com.au  Fact Sheet for Healthcare Providers: IncredibleEmployment.be  This test is not yet approved or cleared by the [place] FDA and has been authorized for detection and/or diagnosis of SARS-CoV-2 by FDA under an Emergency Use Authorization (EUA). This EUA will remain in effect (meaning this test can be used) for the duration of the COVID-19 declaration under Section 564(b)(1) of the Act, 21 U.S.C. section 360bbb-3(b)(1), unless the authorization is terminated or revoked.  Performed at [facility], [address] [place], [place] [address]   Urine Culture     Status: None   Collection Time: 03/28/22  4:22 PM   Specimen: Urine, Catheterized  Result Value Ref Range Status   Specimen Description   Final    URINE,  CATHETERIZED Performed at [facility], [address], [place], [place] [address]    Special Requests   Final    NONE Performed at [facility], [address] [place], [place] [address]    Culture   Final    NO GROWTH Performed at [facility], [place] [address] [place], [place] [address]    Report Status 03/30/2022 FINAL  Final  Urine Culture (for pregnant, neutropenic or urologic patients or patients with an indwelling urinary catheter)     Status: None   Collection Time: 03/28/22  9:09 PM   Specimen: Urine, Clean Catch  Result  Value Ref Range Status   Specimen Description URINE, CLEAN CATCH  Final   Special Requests NONE  Final   Culture   Final    NO GROWTH Performed at [facility], [address] [place], [place] [address]    Report Status 03/30/2022 FINAL  Final  Culture, Respiratory w Gram Stain     Status: None   Collection Time: 03/28/22  9:09 PM   Specimen: Tracheal Aspirate; Respiratory  Result Value Ref Range Status   Specimen Description TRACHEAL ASPIRATE  Final   Special Requests NONE  Final   Gram Stain   Final    RARE WBC PRESENT, PREDOMINANTLY PMN FEW GRAM POSITIVE COCCI IN PAIRS Performed at [facility], [place] [address] [place], [place] [address]    Culture MODERATE STREPTOCOCCUS PNEUMONIAE  Final   Report Status 04/02/2022 FINAL  Final   Organism ID, Bacteria STREPTOCOCCUS PNEUMONIAE  Final      Susceptibility   Streptococcus pneumoniae - MIC*    ERYTHROMYCIN <=0.12 SENSITIVE Sensitive     LEVOFLOXACIN 0.5 SENSITIVE Sensitive     VANCOMYCIN 0.5 SENSITIVE Sensitive     PENICILLIN (meningitis) <=0.06 SENSITIVE Sensitive     PENO - penicillin <=0.06      PENICILLIN (non-meningitis) <=0.06 SENSITIVE Sensitive     PENICILLIN (oral) <=0.06 SENSITIVE Sensitive     CEFTRIAXONE (non-meningitis) <=0.12 SENSITIVE Sensitive     CEFTRIAXONE (meningitis) <=0.12 SENSITIVE Sensitive     * MODERATE STREPTOCOCCUS PNEUMONIAE  Respiratory (~20  pathogens) panel by PCR     Status: Abnormal   Collection Time: 03/28/22  9:09 PM   Specimen: Tracheal Aspirate; Respiratory  Result Value Ref Range Status   Adenovirus NOT DETECTED NOT DETECTED Final   Coronavirus 229E NOT DETECTED NOT DETECTED Final    Comment: (NOTE) The Coronavirus on the Respiratory Panel, DOES NOT test for the novel  Coronavirus (2019 nCoV)    Coronavirus HKU1 NOT DETECTED NOT DETECTED Final   Coronavirus NL63 NOT DETECTED NOT DETECTED Final   Coronavirus OC43 DETECTED (A) NOT DETECTED Final   Metapneumovirus NOT DETECTED NOT DETECTED Final   Rhinovirus / Enterovirus NOT DETECTED NOT DETECTED Final   Influenza A NOT DETECTED NOT DETECTED Final   Influenza B NOT DETECTED NOT DETECTED Final   Parainfluenza Virus 1 NOT DETECTED NOT DETECTED Final   Parainfluenza Virus 2 NOT DETECTED NOT DETECTED Final   Parainfluenza Virus 3 NOT DETECTED NOT DETECTED Final   Parainfluenza Virus 4 NOT DETECTED NOT DETECTED Final   Respiratory Syncytial Virus NOT DETECTED NOT DETECTED Final   Bordetella pertussis NOT DETECTED NOT DETECTED Final   Bordetella Parapertussis NOT DETECTED NOT DETECTED Final   Chlamydophila pneumoniae NOT DETECTED NOT DETECTED Final   Mycoplasma pneumoniae NOT DETECTED NOT DETECTED Final    Comment: Performed at [facility], [place] [place], [place] [address]  MRSA Next Gen by PCR, Nasal     Status: Abnormal   Collection Time: 03/28/22  9:09 PM   Specimen: Nasal Mucosa; Nasal Swab  Result Value Ref Range Status   MRSA by PCR Next Gen DETECTED (A) NOT DETECTED Final    Comment: RESULT CALLED TO, READ BACK BY AND VERIFIED WITH: SAVAGE RN 03/28/22 @ 5681 BY AB (NOTE) The GeneXpert MRSA Assay (FDA approved for NASAL specimens only), is one component of a comprehensive MRSA colonization surveillance program. It is not intended to diagnose MRSA infection nor to guide or monitor treatment for MRSA infections.  Test performance is not FDA  approved in patients less than 28 years old. Performed at [facility], [place] [address] [place], [place] [address]     FURTHER DISCHARGE INSTRUCTIONS:  Get Medicines reviewed and adjusted: Please take all your medications with you for your next visit with your Primary MD  Laboratory/radiological data: Please request your Primary MD to go over all hospital tests and procedure/radiological results at the follow up, please ask your Primary MD to get all Hospital records sent to his/her office.  In some cases, they will be blood work, cultures and biopsy results pending at the time of your discharge. Please request that your primary care M.D. goes through all the records of your hospital data and follows up on these results.  Also Note the following: If you experience worsening of your admission symptoms, develop shortness of breath, life threatening emergency, suicidal or homicidal thoughts you must seek medical attention immediately by calling 911 or calling your MD immediately  if symptoms less severe.  You must read complete instructions/literature along with all the possible adverse reactions/side effects for all the Medicines you take and that have been prescribed to you. Take any new Medicines after you have completely understood and accpet all the possible adverse reactions/side effects.   Do not drive when taking Pain medications or sleeping medications (Benzodaizepines)  Do not take more than prescribed Pain, Sleep and Anxiety Medications. It is not advisable to combine anxiety,sleep and pain medications without talking with your primary care practitioner  Special Instructions: If you have smoked or chewed Tobacco  in the last 2 yrs please stop smoking, stop any regular Alcohol  and or any Recreational drug use.  Wear Seat belts while driving.  Please note: You were cared for by a hospitalist during your hospital stay. Once you are discharged, your primary care physician  will handle any further medical issues. Please note that NO REFILLS for any discharge medications will be authorized once you are discharged, as it is imperative that you return to your primary care physician (or establish a relationship with a primary care physician if you do not have one) for your post hospital discharge needs so that they can reassess your need for medications and monitor your lab values.  Total Time spent coordinating discharge including counseling, education and face to face time equals greater than 30 minutes.  SignedOren Binet 04/06/2022 8:09 AM

## 2022-04-05 NOTE — Progress Notes (Signed)
PROGRESS NOTE        PATIENT DETAILS Name: Emily Rocha Age: 67 y.o. Sex: female Date of Birth: [birth year] Admit Date: 03/28/2022 Admitting Physician Collier Bullock, MD UEA:[id number], Costella Hatcher, MD  Brief Summary: Patient is a 67 y.o.  female w hx of COPD on Home O2, severe AS s/p TAVR, CAD-s/p CABG admitted via [facility] to [facility] ICU for acute on chronic combined hypoxic and hypercarbic respiratory failure-requiring intubation.  Stabilized in the ICU-hospital course complicated by development of A-fib with RVR.  Transferred to [facility] on 2/15.  Significant events: 2/11>> admit to ICU by PCCM 2/12>> extubated 2/13>> episode of A-fib RVR 2/15>> transferred to [facility] an episode of shortness of breath-CXR with possible subglottic stenosis or edema-changed to IV steroids.  Significant studies: 2/11>> CT chest/abdomen/pelvis: Multifocal pneumonia 2/12>> CT head: No acute intracranial abnormality 2/13>> echo: EF 81-19%, RV systolic function moderately reduced.  Normal structure/functioning of aortic valve prosthesis 2/15>> CXR: Narrowing of the subglottic airway may reflect stenosis/edema 2/18>> CT neck: No evidence of subglottic stenosis.  Significant microbiology data: 2/11>> COVID/influenza/RSV PCR: Negative 2/11>> respiratory virus panel: +ve Coronavirus OC43 2/11>> blood culture: 1/2 Staph hominis (contamination) 2/11>> tracheal aspirate: Streptococcus pneumoniae  Procedures: 2/11-2/12>> ETT  Consults: PCCM.  Subjective: Awake/alert-no major complaints overnight.  Lying comfortably in bed.  Objective: Vitals: Blood pressure 115/60, pulse 80, temperature 97.7 F (36.5 C), temperature source Axillary, resp. rate (!) 24, height 5\' 4"  (1.626 m), weight 61.6 kg, SpO2 92 %.   Exam: Gen Exam:Alert awake-not in any distress HEENT:atraumatic, normocephalic Chest: B/L clear to auscultation anteriorly CVS:S1S2 regular Abdomen:soft non tender, non  distended Extremities:no edema Neurology: Non focal Skin: no rash  Pertinent Labs/Radiology:    Latest Ref Rng & Units 04/02/2022   10:01 AM 03/31/2022   12:27 AM 03/30/2022    2:04 AM  CBC  WBC 4.0 - 10.5 K/uL 10.1  11.3  11.7   Hemoglobin 12.0 - 15.0 g/dL 12.7  11.8  11.9   Hematocrit 36.0 - 46.0 % 43.4  40.4  39.8   Platelets 150 - 400 K/uL 224  207  187     Lab Results  Component Value Date   NA 134 (L) 04/05/2022   K 4.3 04/05/2022   CL 92 (L) 04/05/2022   CO2 34 (H) 04/05/2022      Assessment/Plan: Septic shock secondary to multifocal PNA with ARDS (coronavirus and streptococcal pneumonia on culture data) Sepsis physiology has resolved Culture data as above Completed Rocephin x 7 days total on 2/17  Acute on chronic hypoxic and hypercarbic respiratory failure Due to PNA/COPD exacerbation Intubated on 2/11-extubated on 2/12 Continue bronchodilators-transition to prednisone  Acute metabolic encephalopathy Due to hypercarbia Overall much improved-awake/alert this morning Some intermittent delirium-maintain delirium precautions.    COPD exacerbation Improved Taper steroids over the next few days Continue bronchodilators  ?  Subglottic edema Developed SOB on 2/15-CXR with some suggestion of subglottic stenosis-however patient without any clinical features.   She was switched back to IV steroids-CT neck was finally obtained on 2/18-no evidence of subglottic stenosis.  Suspect x-ray findings were error/artifact.   A-fib RVR Occurred on 2/30 Back to sinus rhythm Echo as above Continue metoprolol Continue Eliquis  Elevated troponins likely due to demand ischemia No chest pain Echo with stable EF  CAD s/p CABG 2018 and PCI April 2022 Elevated  troponins on admission likely due to demand ischemia Continue statin/beta-blocker-no longer on aspirin as on Eliquis Will require cardiology follow-up at some point   History of severe aortic stenosis-s/p TAVR  2022 Echo shows normal structure/function of prosthetic valve  Chronic diastolic heart failure Volume status relatively stable Due metabolic alkalosis-she was switched to Diamox, will reduce dose of Diamox today and restart low-dose Demadex.    Hypokalemia Replete/recheck  Mildly elevated ammonia levels Unclear etiology No history of cirrhosis Continue to titrate down lactulose-changed to daily dosing today.  History of CVA Moving all 4 extremities Now on Eliquis  GERD PPI  History of T1 cN0 M0 adenocarcinoma the right upper lobe  s/p R VATS, RULectomy on 10/26/2018   Tobacco abuse Patient denies but per prior notes-ongoing use per husband Transdermal nicotine  Debility/deconditioning PT/OT eval ongoing-SNF recommended  BMI Estimated body mass index is 23.31 kg/m as calculated from the following:   Height as of this encounter: 5\' 4"  (1.626 m).   Weight as of this encounter: 61.6 kg.   Code status:   Code Status: Full Code   DVT Prophylaxis: SCDs Start: 03/28/22 2128 apixaban (ELIQUIS) tablet 5 mg    Family Communication: Spouse-Dexter Terrill-[phone] VM 2/19   Disposition Plan: Status is: Inpatient Remains inpatient appropriate because: Severity of illness   Planned Discharge Destination:Skilled nursing facility   Diet: Diet Order             Diet Heart Room service appropriate? Yes; Fluid consistency: Thin  Diet effective now                     Antimicrobial agents: Anti-infectives (From admission, onward)    Start     Dose/Rate Route Frequency Ordered Stop   04/02/22 1000  cefTRIAXone (ROCEPHIN) 2 g in sodium chloride 0.9 % 100 mL IVPB        2 g 200 mL/hr over 30 Minutes Intravenous Every 24 hours 04/01/22 1419 04/03/22 1029   03/30/22 1400  vancomycin (VANCOREADY) IVPB 750 mg/150 mL  Status:  Discontinued        750 mg 150 mL/hr over 60 Minutes Intravenous Every 12 hours 03/30/22 1014 03/31/22 1518   03/30/22 1000  cefTRIAXone  (ROCEPHIN) 2 g in sodium chloride 0.9 % 100 mL IVPB  Status:  Discontinued        2 g 200 mL/hr over 30 Minutes Intravenous Every 24 hours 03/30/22 0837 04/01/22 1419   03/30/22 1000  azithromycin (ZITHROMAX) tablet 500 mg  Status:  Discontinued        500 mg Oral Daily 03/30/22 0842 03/31/22 0823   03/30/22 0200  vancomycin (VANCOREADY) IVPB 500 mg/100 mL  Status:  Discontinued        500 mg 100 mL/hr over 60 Minutes Intravenous Every 12 hours 03/29/22 1454 03/30/22 1014   03/29/22 1415  vancomycin (VANCOREADY) IVPB 1500 mg/300 mL        1,500 mg 150 mL/hr over 120 Minutes Intravenous  Once 03/29/22 1315 03/29/22 1544   03/28/22 1600  cefTRIAXone (ROCEPHIN) 2 g in sodium chloride 0.9 % 100 mL IVPB  Status:  Discontinued        2 g 200 mL/hr over 30 Minutes Intravenous Every 24 hours 03/28/22 1549 03/30/22 0813   03/28/22 1600  azithromycin (ZITHROMAX) 500 mg in sodium chloride 0.9 % 250 mL IVPB  Status:  Discontinued        500 mg 250 mL/hr over 60 Minutes Intravenous Every 24 hours  03/28/22 1549 03/30/22 0842        MEDICATIONS: Scheduled Meds:  acetaZOLAMIDE  500 mg Oral BID   apixaban  5 mg Oral BID   arformoterol  15 mcg Nebulization BID   aspirin  81 mg Oral Daily   budesonide (PULMICORT) nebulizer solution  0.5 mg Nebulization BID   guaiFENesin  600 mg Oral BID   insulin aspart  0-15 Units Subcutaneous TID WC   lactulose  20 g Oral Daily   melatonin  3 mg Oral QHS   methylPREDNISolone (SOLU-MEDROL) injection  40 mg Intravenous Q12H   metoprolol tartrate  37.5 mg Oral BID   nicotine  14 mg Transdermal Daily   pantoprazole  40 mg Oral Daily   polyethylene glycol  17 g Oral Daily   revefenacin  175 mcg Nebulization Daily   Continuous Infusions:  sodium chloride 10 mL/hr at 04/03/22 0529   sodium chloride Stopped (03/30/22 1303)   PRN Meds:.acetaminophen, albuterol, ALPRAZolam, metoprolol tartrate, ondansetron (ZOFRAN) IV, mouth rinse, polyethylene glycol   I have  personally reviewed following labs and imaging studies  LABORATORY DATA: CBC: Recent Labs  Lab 03/30/22 0204 03/31/22 0027 04/02/22 1001  WBC 11.7* 11.3* 10.1  HGB 11.9* 11.8* 12.7  HCT 39.8 40.4 43.4  MCV 89.4 91.0 89.3  PLT 187 207 224     Basic Metabolic Panel: Recent Labs  Lab 04/01/22 0233 04/02/22 1001 04/03/22 0412 04/04/22 0422 04/05/22 0424  NA 136 136 136 137 134*  K 4.3 4.8 4.3 3.3* 4.3  CL 90* 86* 82* 84* 92*  CO2 36* 40* >45* 43* 34*  GLUCOSE 102* 168* 104* 97 136*  BUN 33* 22 23 19 21   CREATININE 0.54 0.47 0.39* 0.52 0.55  CALCIUM 8.9 8.9 8.7* 9.0 9.3  MG 2.2 2.0 1.9 2.1 2.3  PHOS 2.5 3.7 2.9 3.0 5.3*     GFR: Estimated Creatinine Clearance: 58.1 mL/min (by C-G formula based on SCr of 0.55 mg/dL).  Liver Function Tests: Recent Labs  Lab 03/31/22 0027 04/02/22 1001  AST 80* 36  ALT 101* 67*  ALKPHOS 145* 107  BILITOT 0.6 0.9  PROT 6.6 6.1*  ALBUMIN 2.7* 2.8*    No results for input(s): "LIPASE", "AMYLASE" in the last 168 hours. Recent Labs  Lab 03/30/22 0204 03/31/22 0027 04/01/22 0233 04/02/22 1001 04/03/22 0412  AMMONIA 47* 71* 46* 53* 55*     Coagulation Profile: No results for input(s): "INR", "PROTIME" in the last 168 hours.   Cardiac Enzymes: No results for input(s): "CKTOTAL", "CKMB", "CKMBINDEX", "TROPONINI" in the last 168 hours.  BNP (last 3 results) No results for input(s): "PROBNP" in the last 8760 hours.  Lipid Profile: No results for input(s): "CHOL", "HDL", "LDLCALC", "TRIG", "CHOLHDL", "LDLDIRECT" in the last 72 hours.  Thyroid Function Tests: No results for input(s): "TSH", "T4TOTAL", "FREET4", "T3FREE", "THYROIDAB" in the last 72 hours.  Anemia Panel: No results for input(s): "VITAMINB12", "FOLATE", "FERRITIN", "TIBC", "IRON", "RETICCTPCT" in the last 72 hours.  Urine analysis:    Component Value Date/Time   COLORURINE YELLOW 03/28/2022 1622   APPEARANCEUR CLEAR 03/28/2022 1622   LABSPEC 1.014  03/28/2022 1622   PHURINE 6.0 03/28/2022 1622   GLUCOSEU NEGATIVE 03/28/2022 1622   HGBUR NEGATIVE 03/28/2022 1622   BILIRUBINUR NEGATIVE 03/28/2022 1622   KETONESUR 5 (A) 03/28/2022 1622   PROTEINUR 30 (A) 03/28/2022 1622   UROBILINOGEN 0.2 12/04/2014 1737   NITRITE NEGATIVE 03/28/2022 1622   LEUKOCYTESUR NEGATIVE 03/28/2022 1622    Sepsis Labs:  Lactic Acid, Venous    Component Value Date/Time   LATICACIDVEN 1.0 03/29/2022 0255    MICROBIOLOGY: Recent Results (from the past 240 hour(s))  Blood Culture (routine x 2)     Status: Abnormal   Collection Time: 03/28/22  3:49 PM   Specimen: BLOOD LEFT FOREARM  Result Value Ref Range Status   Specimen Description   Final    BLOOD LEFT FOREARM BOTTLES DRAWN AEROBIC AND ANAEROBIC Performed at [facility], [address], [place], [place] [address]    Special Requests   Final    Blood Culture adequate volume Performed at [facility], [address] [place], [place] [address]    Culture  Setup Time   Final    AEROBIC BOTTLE ONLY GRAM POSITIVE COCCI Gram Stain Report Called to,Read Back By and Verified With: PHYLLIS MCKINNEY @ 7628 ON 02/26/22 C VARNER CRITICAL RESULT CALLED TO, READ BACK BY AND VERIFIED WITH:  C/ PHARMD T. DANG 03/29/22 1719 A. LAFRANCE    Culture (A)  Final    STAPHYLOCOCCUS HOMINIS THE SIGNIFICANCE OF ISOLATING THIS ORGANISM FROM A SINGLE SET OF BLOOD CULTURES WHEN MULTIPLE SETS ARE DRAWN IS UNCERTAIN. PLEASE NOTIFY THE MICROBIOLOGY DEPARTMENT WITHIN ONE WEEK IF SPECIATION AND SENSITIVITIES ARE REQUIRED. Performed at [facility], [place] [address], [place], [place] [address]    Report Status 03/31/2022 FINAL  Final  Blood Culture ID Panel (Reflexed)     Status: Abnormal   Collection Time: 03/28/22  3:49 PM  Result Value Ref Range Status   Enterococcus faecalis NOT DETECTED NOT DETECTED Final   Enterococcus Faecium NOT DETECTED NOT DETECTED Final   Listeria monocytogenes NOT DETECTED NOT DETECTED Final    Staphylococcus species DETECTED (A) NOT DETECTED Final    Comment: CRITICAL RESULT CALLED TO, READ BACK BY AND VERIFIED WITH:  C/ PHARMD T. DANG 03/29/22 1719 A. LAFRANCE    Staphylococcus aureus (BCID) NOT DETECTED NOT DETECTED Final   Staphylococcus epidermidis NOT DETECTED NOT DETECTED Final   Staphylococcus lugdunensis NOT DETECTED NOT DETECTED Final   Streptococcus species NOT DETECTED NOT DETECTED Final   Streptococcus agalactiae NOT DETECTED NOT DETECTED Final   Streptococcus pneumoniae NOT DETECTED NOT DETECTED Final   Streptococcus pyogenes NOT DETECTED NOT DETECTED Final   A.calcoaceticus-baumannii NOT DETECTED NOT DETECTED Final   Bacteroides fragilis NOT DETECTED NOT DETECTED Final   Enterobacterales NOT DETECTED NOT DETECTED Final   Enterobacter cloacae complex NOT DETECTED NOT DETECTED Final   Escherichia coli NOT DETECTED NOT DETECTED Final   Klebsiella aerogenes NOT DETECTED NOT DETECTED Final   Klebsiella oxytoca NOT DETECTED NOT DETECTED Final   Klebsiella pneumoniae NOT DETECTED NOT DETECTED Final   Proteus species NOT DETECTED NOT DETECTED Final   Salmonella species NOT DETECTED NOT DETECTED Final   Serratia marcescens NOT DETECTED NOT DETECTED Final   Haemophilus influenzae NOT DETECTED NOT DETECTED Final   Neisseria meningitidis NOT DETECTED NOT DETECTED Final   Pseudomonas aeruginosa NOT DETECTED NOT DETECTED Final   Stenotrophomonas maltophilia NOT DETECTED NOT DETECTED Final   Candida albicans NOT DETECTED NOT DETECTED Final   Candida auris NOT DETECTED NOT DETECTED Final   Candida glabrata NOT DETECTED NOT DETECTED Final   Candida krusei NOT DETECTED NOT DETECTED Final   Candida parapsilosis NOT DETECTED NOT DETECTED Final   Candida tropicalis NOT DETECTED NOT DETECTED Final   Cryptococcus neoformans/gattii NOT DETECTED NOT DETECTED Final    Comment: Performed at [facility], [address],  [place] [address]  Blood Culture (routine x 2)      Status: None   Collection Time: 03/28/22  3:54 PM   Specimen: BLOOD RIGHT HAND  Result Value Ref Range Status   Specimen Description   Final    BLOOD RIGHT HAND BOTTLES DRAWN AEROBIC AND ANAEROBIC   Special Requests Blood Culture adequate volume  Final   Culture   Final    NO GROWTH 6 DAYS Performed at [facility], [address] [place],  [address]    Report Status 04/03/2022 FINAL  Final  Resp panel by RT-PCR (RSV, Flu A&B, Covid) Anterior Nasal Swab     Status: None   Collection Time: 03/28/22  4:22 PM   Specimen: Anterior Nasal Swab  Result Value Ref Range Status   SARS Coronavirus 2 by RT PCR NEGATIVE NEGATIVE Final    Comment: (NOTE) SARS-CoV-2 target nucleic acids are NOT DETECTED.  The SARS-CoV-2 RNA is generally detectable in upper respiratory specimens during the acute phase of infection. The lowest concentration of SARS-CoV-2 viral copies this assay can detect is 138 copies/mL. A negative result does not preclude SARS-Cov-2 infection and should not be used as the sole basis for treatment or other patient management decisions. A negative result may occur with  improper specimen collection/handling, submission of specimen other than nasopharyngeal swab, presence of viral mutation(s) within the areas targeted by this assay, and inadequate number of viral copies(<138 copies/mL). A negative result must be combined with clinical observations, patient history, and epidemiological information. The expected result is Negative.  Fact Sheet for Patients:  EntrepreneurPulse.com.au  Fact Sheet for Healthcare Providers:  IncredibleEmployment.be  This test is no t yet approved or cleared by the [place] FDA and  has been authorized for detection and/or diagnosis of SARS-CoV-2 by FDA under an Emergency Use Authorization (EUA). This EUA will remain  in effect (meaning this test can be used) for the duration of the COVID-19  declaration under Section 564(b)(1) of the Act, 21 U.S.C.section 360bbb-3(b)(1), unless the authorization is terminated  or revoked sooner.       Influenza A by PCR NEGATIVE NEGATIVE Final   Influenza B by PCR NEGATIVE NEGATIVE Final    Comment: (NOTE) The Xpert Xpress SARS-CoV-2/FLU/RSV plus assay is intended as an aid in the diagnosis of influenza from Nasopharyngeal swab specimens and should not be used as a sole basis for treatment. Nasal washings and aspirates are unacceptable for Xpert Xpress SARS-CoV-2/FLU/RSV testing.  Fact Sheet for Patients: EntrepreneurPulse.com.au  Fact Sheet for Healthcare Providers: IncredibleEmployment.be  This test is not yet approved or cleared by the [place] FDA and has been authorized for detection and/or diagnosis of SARS-CoV-2 by FDA under an Emergency Use Authorization (EUA). This EUA will remain in effect (meaning this test can be used) for the duration of the COVID-19 declaration under Section 564(b)(1) of the Act, 21 U.S.C. section 360bbb-3(b)(1), unless the authorization is terminated or revoked.     Resp Syncytial Virus by PCR NEGATIVE NEGATIVE Final    Comment: (NOTE) Fact Sheet for Patients: EntrepreneurPulse.com.au  Fact Sheet for Healthcare Providers: IncredibleEmployment.be  This test is not yet approved or cleared by the [place] FDA and has been authorized for detection and/or diagnosis of SARS-CoV-2 by FDA under an Emergency Use Authorization (EUA). This EUA will remain in effect (meaning this test can be used) for the duration of the COVID-19 declaration under Section 564(b)(1) of the Act, 21 U.S.C. section 360bbb-3(b)(1), unless the authorization is terminated or  revoked.  Performed at [facility], [address], [place], [place] [address]   Urine Culture     Status: None   Collection Time: 03/28/22  4:22 PM   Specimen: Urine,  Catheterized  Result Value Ref Range Status   Specimen Description   Final    URINE, CATHETERIZED Performed at [facility], [address] [place], [place] [address]    Special Requests   Final    NONE Performed at [facility], [address] [place], [place] [address]    Culture   Final    NO GROWTH Performed at [facility], [place] [address], [place], [place] [address]    Report Status 03/30/2022 FINAL  Final  Urine Culture (for pregnant, neutropenic or urologic patients or patients with an indwelling urinary catheter)     Status: None   Collection Time: 03/28/22  9:09 PM   Specimen: Urine, Clean Catch  Result Value Ref Range Status   Specimen Description URINE, CLEAN CATCH  Final   Special Requests NONE  Final   Culture   Final    NO GROWTH Performed at [facility], [place] [address], [place], [place] [address]    Report Status 03/30/2022 FINAL  Final  Culture, Respiratory w Gram Stain     Status: None   Collection Time: 03/28/22  9:09 PM   Specimen: Tracheal Aspirate; Respiratory  Result Value Ref Range Status   Specimen Description TRACHEAL ASPIRATE  Final   Special Requests NONE  Final   Gram Stain   Final    RARE WBC PRESENT, PREDOMINANTLY PMN FEW GRAM POSITIVE COCCI IN PAIRS Performed at [facility], [place] [address], [place], [place] [address]    Culture MODERATE STREPTOCOCCUS PNEUMONIAE  Final   Report Status 04/02/2022 FINAL  Final   Organism ID, Bacteria STREPTOCOCCUS PNEUMONIAE  Final      Susceptibility   Streptococcus pneumoniae - MIC*    ERYTHROMYCIN <=0.12 SENSITIVE Sensitive     LEVOFLOXACIN 0.5 SENSITIVE Sensitive     VANCOMYCIN 0.5 SENSITIVE Sensitive     PENICILLIN (meningitis) <=0.06 SENSITIVE Sensitive     PENO - penicillin <=0.06      PENICILLIN (non-meningitis) <=0.06 SENSITIVE Sensitive     PENICILLIN (oral) <=0.06 SENSITIVE Sensitive     CEFTRIAXONE (non-meningitis) <=0.12 SENSITIVE Sensitive     CEFTRIAXONE (meningitis)  <=0.12 SENSITIVE Sensitive     * MODERATE STREPTOCOCCUS PNEUMONIAE  Respiratory (~20 pathogens) panel by PCR     Status: Abnormal   Collection Time: 03/28/22  9:09 PM   Specimen: Tracheal Aspirate; Respiratory  Result Value Ref Range Status   Adenovirus NOT DETECTED NOT DETECTED Final   Coronavirus 229E NOT DETECTED NOT DETECTED Final    Comment: (NOTE) The Coronavirus on the Respiratory Panel, DOES NOT test for the novel  Coronavirus (2019 nCoV)    Coronavirus HKU1 NOT DETECTED NOT DETECTED Final   Coronavirus NL63 NOT DETECTED NOT DETECTED Final   Coronavirus OC43 DETECTED (A) NOT DETECTED Final   Metapneumovirus NOT DETECTED NOT DETECTED Final   Rhinovirus / Enterovirus NOT DETECTED NOT DETECTED Final   Influenza A NOT DETECTED NOT DETECTED Final   Influenza B NOT DETECTED NOT DETECTED Final   Parainfluenza Virus 1 NOT DETECTED NOT DETECTED Final   Parainfluenza Virus 2 NOT DETECTED NOT DETECTED Final   Parainfluenza Virus 3 NOT DETECTED NOT DETECTED Final   Parainfluenza Virus 4 NOT DETECTED NOT DETECTED Final   Respiratory Syncytial Virus NOT  DETECTED NOT DETECTED Final   Bordetella pertussis NOT DETECTED NOT DETECTED Final   Bordetella Parapertussis NOT DETECTED NOT DETECTED Final   Chlamydophila pneumoniae NOT DETECTED NOT DETECTED Final   Mycoplasma pneumoniae NOT DETECTED NOT DETECTED Final    Comment: Performed at [facility], [place] [address] [place], [place] [address]  MRSA Next Gen by PCR, Nasal     Status: Abnormal   Collection Time: 03/28/22  9:09 PM   Specimen: Nasal Mucosa; Nasal Swab  Result Value Ref Range Status   MRSA by PCR Next Gen DETECTED (A) NOT DETECTED Final    Comment: RESULT CALLED TO, READ BACK BY AND VERIFIED WITH: SAVAGE RN 03/28/22 @ 8341 BY AB (NOTE) The GeneXpert MRSA Assay (FDA approved for NASAL specimens only), is one component of a comprehensive MRSA colonization surveillance program. It is not intended to diagnose MRSA infection nor  to guide or monitor treatment for MRSA infections. Test performance is not FDA approved in patients less than 4 years old. Performed at [facility], [place] [address] [place], [place] [address]     RADIOLOGY STUDIES/RESULTS: CT SOFT TISSUE NECK WO CONTRAST  Result Date: 04/04/2022 CLINICAL DATA:  Possible subglottic stenosis EXAM: CT NECK WITHOUT CONTRAST TECHNIQUE: Multidetector CT imaging of the neck was performed following the standard protocol without intravenous contrast. RADIATION DOSE REDUCTION: This exam was performed according to the departmental dose-optimization program which includes automated exposure control, adjustment of the mA and/or kV according to patient size and/or use of iterative reconstruction technique. COMPARISON:  No prior CT neck available, correlation is made with CTA neck 10/28/2018 FINDINGS: Pharynx and larynx: Normal. No mass or swelling. No evidence of subglottic stenosis. Salivary glands: No inflammation, mass, or stone. Thyroid: Normal. Lymph nodes: None enlarged or abnormal density. Vascular: Limited evaluation in the absence of intravenous contrast. Limited intracranial: Negative. Visualized orbits: Negative. Mastoids and visualized paranasal sinuses: Air-fluid levels in the left-greater-than-right maxillary sinuses and fluid in the right mastoid air cells, which may be secondary to recent intubation. Skeleton: No acute osseous abnormality. Degenerative changes in the bilateral temporomandibular joints. Edentulous. Upper chest: Emphysema. No focal pulmonary opacity or pleural effusion. Other: None. IMPRESSION: 1. No evidence of subglottic stenosis. 2. No acute finding in the neck. Electronically Signed   By: Merilyn Baba M.D.   On: 04/04/2022 20:28     LOS: 8 days   Oren Binet, MD  [facility]    To contact the attending provider between 7A-7P or the covering provider during after hours 7P-7A, please log into the web site [email or web address] and  access using universal [place] password for that web site. If you do not have the password, please call the hospital operator.  04/05/2022, 10:58 AM

## 2022-04-05 NOTE — Progress Notes (Signed)
Mobility Specialist Progress Note   04/05/22 1815  Mobility  Activity Ambulated with assistance in hallway  Level of Assistance Contact guard assist, steadying assist  Assistive Device None  Distance Ambulated (ft) 200 ft  Activity Response Tolerated well  Mobility Referral Yes  $Mobility charge 1 Mobility   Pre Mobility: 94% SpO2 on 4LO2 During Mobility: 91% SpO2 on 4LO2 Post Mobility: 90% SpO2 on 4LO2  Received in bed w/o complaint and agreeable. X1 standing rest break d/t SOB but no faults throughout. Pt remained on 4LO2 for entirety of the session and was left back in bed w/ all needs met.   [place] F[place] Mobility Specialist Please contact via SecureChat or  Rehab office at [phone]'

## 2022-04-05 NOTE — Progress Notes (Signed)
Physical Therapy Treatment Patient Details Name: Emily Rocha MRN: [id number] DOB: 1955/12/04 Today's Date: 04/05/2022   History of Present Illness 67 yo female presents to ED on 2/11 found on floor and unresponsive. Initially presented to [facility], transferred to [facility]. Workup for acute respiratory failure with hypercarbia, COPD exacerbation. ETT 2/11-12. PMH of COPD, Asthma, CAD, CVA, lung cancer s/p lobectomy, aortic stenosis s/p TAVR.    PT Comments    Pt has significantly improved from initial evaluation. Currently pt is Mod I for functional mobility and supervision for gait due to O2 sats. Pt requires increased O2 for gait over 50 ft at 4L then 5L for greater than 150 ft. Pt is able to pace herself and take rest breaks with quick recovery back up to 92-97% O2 sats after steep declines. Pt was not using an assistive device throughout. Due to pt current functional status, home set up and mild balance deficits recommending skilled physical therapy services in HHPT setting on discharge from acute care hospital setting at this time to decrease risk for falls and re-hospitalization.     Recommendations for follow up therapy are one component of a multi-disciplinary discharge planning process, led by the attending physician.  Recommendations may be updated based on patient status, additional functional criteria and insurance authorization.  Follow Up Recommendations  Home health PT Can patient physically be transported by private vehicle: Yes   Assistance Recommended at Discharge Set up Supervision/Assistance  Patient can return home with the following A little help with walking and/or transfers;Assistance with cooking/housework;Assist for transportation;Help with stairs or ramp for entrance   Equipment Recommendations  None recommended by PT    Recommendations for Other Services       Precautions / Restrictions Precautions Precautions: Fall Precaution Comments: 3L O2 baseline, hasn't used it  since May until hospitalization. Restrictions Weight Bearing Restrictions: No     Mobility  Bed Mobility Overal bed mobility: Modified Independent Bed Mobility: Supine to Sit, Sit to Supine     Supine to sit: Modified independent (Device/Increase time) Sit to supine: Modified independent (Device/Increase time)   General bed mobility comments: Slight increase in time and assistance with line management otherwise pt was Mod I with HOB down and very minimal use of rails. Patient Response: Cooperative  Transfers Overall transfer level: Modified independent Equipment used: None Transfers: Sit to/from Stand Sit to Stand: Modified independent (Device/Increase time)           General transfer comment: No LOB with standing. Pt was assisted with donning brief due to difficulty with voiding.    Ambulation/Gait Ambulation/Gait assistance: Supervision Gait Distance (Feet): 200 Feet Assistive device: None (Pt pushed O2 tank) Gait Pattern/deviations: Step-through pattern, Decreased stride length Gait velocity: Decreased cadence. Gait velocity interpretation: 1.31 - 2.62 ft/sec, indicative of limited community ambulator   General Gait Details: Pt was able to navigate well without difficulty no overt LOB, slightly slower cadence due to respiratory status. INcreased to 4L due to drop to 87% after ~75 ft and then up to 5L after 150 ft due to O2 sat dropped to 84%. Pt recovered quickly <30 seconds to 92% O2 sat        Balance Overall balance assessment: No apparent balance deficits (not formally assessed)   Sitting balance-Leahy Scale: Normal Sitting balance - Comments: Sitting with and without UE support without difficulty   Standing balance support: No upper extremity supported Standing balance-Leahy Scale: Good Standing balance comment: No LOB, slightly wider BOS in standing.  Cognition Arousal/Alertness: Awake/alert Behavior During Therapy: WFL for tasks  assessed/performed Overall Cognitive Status: Within Functional Limits for tasks assessed           General Comments General comments (skin integrity, edema, etc.): Pt O2 need to be increased for mobility.      Pertinent Vitals/Pain Pain Assessment Pain Assessment: No/denies pain     PT Goals (current goals can now be found in the care plan section) Acute Rehab PT Goals Patient Stated Goal: go home PT Goal Formulation: With patient Time For Goal Achievement: 04/14/22 Potential to Achieve Goals: Good Progress towards PT goals: Progressing toward goals    Frequency    Min 3X/week      PT Plan Discharge plan needs to be updated       AM-PAC PT "6 Clicks" Mobility   Outcome Measure  Help needed turning from your back to your side while in a flat bed without using bedrails?: None Help needed moving from lying on your back to sitting on the side of a flat bed without using bedrails?: None Help needed moving to and from a bed to a chair (including a wheelchair)?: None Help needed standing up from a chair using your arms (e.g., wheelchair or bedside chair)?: None Help needed to walk in hospital room?: A Little Help needed climbing 3-5 steps with a railing? : A Little 6 Click Score: 22    End of Session Equipment Utilized During Treatment: Oxygen;Gait belt Activity Tolerance: Patient tolerated treatment well Patient left: with call bell/phone within reach;in bed Nurse Communication: Mobility status PT Visit Diagnosis: Other abnormalities of gait and mobility (R26.89);Muscle weakness (generalized) (M62.81)     Time: 7416-3845 PT Time Calculation (min) (ACUTE ONLY): 29 min  Charges:  $Gait Training: 8-22 mins $Therapeutic Activity: 8-22 mins                     Tomma Rakers, DPT, CLT  Acute Rehabilitation Services Office: [phone] (Secure chat preferred)    Ander Purpura 04/05/2022, 4:06 PM

## 2022-04-06 ENCOUNTER — Other Ambulatory Visit: Payer: Medicare HMO

## 2022-04-06 ENCOUNTER — Other Ambulatory Visit (HOSPITAL_COMMUNITY): Payer: Self-pay

## 2022-04-06 DIAGNOSIS — J9602 Acute respiratory failure with hypercapnia: Secondary | ICD-10-CM | POA: Diagnosis not present

## 2022-04-06 DIAGNOSIS — J189 Pneumonia, unspecified organism: Secondary | ICD-10-CM | POA: Diagnosis not present

## 2022-04-06 DIAGNOSIS — A419 Sepsis, unspecified organism: Secondary | ICD-10-CM | POA: Diagnosis not present

## 2022-04-06 DIAGNOSIS — R7989 Other specified abnormal findings of blood chemistry: Secondary | ICD-10-CM | POA: Diagnosis not present

## 2022-04-06 LAB — GLUCOSE, CAPILLARY: Glucose-Capillary: 89 mg/dL (ref 70–99)

## 2022-04-06 MED ORDER — APIXABAN 5 MG PO TABS
5.0000 mg | ORAL_TABLET | Freq: Two times a day (BID) | ORAL | 1 refills | Status: DC
  Filled 2022-04-06: qty 60, 30d supply, fill #0

## 2022-04-06 MED ORDER — MELATONIN 3 MG PO TABS: 3.0000 mg | ORAL_TABLET | Freq: Every day | ORAL | 0 refills | Status: DC

## 2022-04-06 MED ORDER — PREDNISONE 10 MG PO TABS
ORAL_TABLET | ORAL | 0 refills | Status: DC
  Filled 2022-04-06: qty 10, 4d supply, fill #0

## 2022-04-06 MED ORDER — METOPROLOL SUCCINATE ER 50 MG PO TB24
75.0000 mg | ORAL_TABLET | Freq: Every day | ORAL | 1 refills | Status: DC
  Filled 2022-04-06: qty 90, 60d supply, fill #0

## 2022-04-06 NOTE — Plan of Care (Signed)
Patient discharging home with home health and home [phone]

## 2022-04-06 NOTE — Progress Notes (Signed)
Physical Therapy Treatment Patient Details Name: Emily Rocha MRN: [id number] DOB: Nov 08, 1955 Today's Date: 04/06/2022   History of Present Illness 67 yo female presents to ED on 2/11 found on floor and unresponsive. Initially presented to [facility], transferred to [facility]. Workup for acute respiratory failure with hypercarbia, COPD exacerbation. ETT 2/11-12. PMH of COPD, Asthma, CAD, CVA, lung cancer s/p lobectomy, aortic stenosis s/p TAVR.    PT Comments    Pt greeted supine and agreeable to session with continued progress towards to acute goals. Pt grossly mod I for bed mobility and functional transfers and needing min guard assist throughout gait without AD secondary to O2 sats and for line/lead management. Pt continues to require increased supplemental O2 throughout mobility, up to 6L to maintain over 90% during gait however intermittent poor pleth/wavelength however pt continues to demonstrate good self pacing and recovery of O2 sats >90% with breathing techniques. Reinforced education re; pulse ox use at home, activity recommendations and energy conservation techniques with pt verbalizing understanding.  Current plan remains appropriate to address deficits and maximize functional independence and safety. Pt continues to benefit from skilled PT services to progress toward functional mobility goals.    Recommendations for follow up therapy are one component of a multi-disciplinary discharge planning process, led by the attending physician.  Recommendations may be updated based on patient status, additional functional criteria and insurance authorization.  Follow Up Recommendations  Home health PT Can patient physically be transported by private vehicle: Yes   Assistance Recommended at Discharge Set up Supervision/Assistance  Patient can return home with the following A little help with walking and/or transfers;Assistance with cooking/housework;Assist for transportation;Help with stairs or ramp for  entrance   Equipment Recommendations  None recommended by PT    Recommendations for Other Services       Precautions / Restrictions Precautions Precautions: Fall Precaution Comments: 3L O2 baseline, hasn't used it since May until hospitalization. Restrictions Weight Bearing Restrictions: No     Mobility  Bed Mobility Overal bed mobility: Modified Independent Bed Mobility: Supine to Sit, Sit to Supine     Supine to sit: Modified independent (Device/Increase time) Sit to supine: Modified independent (Device/Increase time)   General bed mobility comments: Slight increase in time and assistance with line management otherwise pt was Mod I with no rail use    Transfers Overall transfer level: Modified independent Equipment used: None Transfers: Sit to/from Stand Sit to Stand: Modified independent (Device/Increase time)           General transfer comment: No LOB with standing.    Ambulation/Gait Ambulation/Gait assistance: Supervision Gait Distance (Feet): 225 Feet Assistive device: None (Pt pushed O2 tank) Gait Pattern/deviations: Step-through pattern, Decreased stride length Gait velocity: Decreased cadence.     General Gait Details: Pt was able to navigate well without difficulty no overt LOB, slightly slower cadence due to respiratory status. increased to 4L 2/2 SpO2 drop to 84%, up to 6L [place] with drop again to low 80s, poor/unreliable pleth intermittently during activity, >93% on 4L once seated EOB   Stairs             Wheelchair Mobility    Modified Rankin (Stroke Patients Only)       Balance Overall balance assessment: No apparent balance deficits (not formally assessed) Sitting-balance support: Feet supported, No upper extremity supported Sitting balance-Leahy Scale: Normal Sitting balance - Comments: Sitting with and without UE support without difficulty   Standing balance support: No upper extremity supported Standing balance-Leahy  Scale: Good Standing balance comment: No LOB, slightly wider BOS in standing.                            Cognition Arousal/Alertness: Awake/alert Behavior During Therapy: WFL for tasks assessed/performed Overall Cognitive Status: Within Functional Limits for tasks assessed                                          Exercises      General Comments General comments (skin integrity, edema, etc.): pt continues to needing increased suplemental O2 during mobility      Pertinent Vitals/Pain Pain Assessment Pain Assessment: No/denies pain Pain Intervention(s): Monitored during session    Home Living                          Prior Function            PT Goals (current goals can now be found in the care plan section) Acute Rehab PT Goals Patient Stated Goal: go home PT Goal Formulation: With patient Time For Goal Achievement: 04/14/22 Progress towards PT goals: Progressing toward goals    Frequency    Min 3X/week      PT Plan      Co-evaluation              AM-PAC PT "6 Clicks" Mobility   Outcome Measure  Help needed turning from your back to your side while in a flat bed without using bedrails?: None Help needed moving from lying on your back to sitting on the side of a flat bed without using bedrails?: None Help needed moving to and from a bed to a chair (including a wheelchair)?: None Help needed standing up from a chair using your arms (e.g., wheelchair or bedside chair)?: None Help needed to walk in hospital room?: A Little Help needed climbing 3-5 steps with a railing? : A Little 6 Click Score: 22    End of Session Equipment Utilized During Treatment: Oxygen;Gait belt Activity Tolerance: Patient tolerated treatment well Patient left: with call bell/phone within reach;in bed;with bed alarm set Nurse Communication: Mobility status PT Visit Diagnosis: Other abnormalities of gait and mobility (R26.89);Muscle weakness  (generalized) (M62.81)     Time: 0165-5374 PT Time Calculation (min) (ACUTE ONLY): 19 min  Charges:  $Gait Training: 8-22 mins                     Brittan Butterbaugh R. PTA Acute Rehabilitation Services Office: [place] 04/06/2022, 9:40 AM

## 2022-04-06 NOTE — Progress Notes (Signed)
Discharge instructions given to patient and her son. Verbalized an understanding. Meds given to patient from pharmacy. Patient discharged home with her son and spouse.

## 2022-04-06 NOTE — TOC Transition Note (Signed)
Transition of Care [facility]) - CM/SW Discharge Note   Patient Details  Name: Emily Rocha MRN: [id number] Date of Birth: [birth year]  [facility] CM/SW Contact:  Levonne Lapping, RN Phone Number: 04/06/2022, 9:02 AM   Clinical Narrative:    CM called patient Husband this am to confirm O2 provider for patient. [employer] has been provided her HOT for over a year. Portable tank will be needed to transport home to [place]. Liaison K[place] contacted and portable ordered. Per Erasmo Downer, tank will be delivered around 9:30 am to patients room. Son will transport home. [facility] will be providing PT and OT. Information entered into AVS   No additional TOC needs    Final next level of care: Home w Home Health Services Barriers to Discharge: No Barriers Identified   Patient Goals and CMS Choice CMS Medicare.gov Compare Post Acute Care list provided to:: Patient Choice offered to / list presented to : Patient  Discharge Placement                         Discharge Plan and Services Additional resources added to the After Visit Summary for   In-house Referral: Clinical Social Work              DME Arranged: Oxygen (Portable to be delivered to room by 9:30 per [employer]) DME Agency: [employer] Date DME Agency Contacted: 04/06/22 Time DME Agency Contacted: [id number] Representative spoke with at DME Agency: [place]: [place] Date [place]: 04/05/22   Representative spoke with at [place]: [place] ([place]) Interventions SDOH Screenings   Depression (PHQ2-9): Low Risk  (10/28/2020)  Tobacco Use: High Risk (03/28/2022)     Readmission Risk Interventions    08/14/2020    2:35 PM  Readmission Risk Prevention Plan  Post Dischage Appt Complete  Medication Screening Complete  Transportation Screening Complete

## 2022-04-09 ENCOUNTER — Encounter: Payer: Medicare HMO | Admitting: Thoracic Surgery (Cardiothoracic Vascular Surgery)

## 2022-04-09 NOTE — Progress Notes (Unsigned)
.  hl

## 2022-04-15 DIAGNOSIS — J189 Pneumonia, unspecified organism: Secondary | ICD-10-CM | POA: Diagnosis not present

## 2022-04-15 DIAGNOSIS — J439 Emphysema, unspecified: Secondary | ICD-10-CM | POA: Diagnosis not present

## 2022-04-15 DIAGNOSIS — I700 Atherosclerosis of aorta: Secondary | ICD-10-CM | POA: Diagnosis not present

## 2022-04-15 DIAGNOSIS — I4891 Unspecified atrial fibrillation: Secondary | ICD-10-CM | POA: Diagnosis not present

## 2022-04-15 DIAGNOSIS — I779 Disorder of arteries and arterioles, unspecified: Secondary | ICD-10-CM | POA: Diagnosis not present

## 2022-04-15 DIAGNOSIS — D692 Other nonthrombocytopenic purpura: Secondary | ICD-10-CM | POA: Diagnosis not present

## 2022-04-15 DIAGNOSIS — Z299 Encounter for prophylactic measures, unspecified: Secondary | ICD-10-CM | POA: Diagnosis not present

## 2022-04-30 ENCOUNTER — Encounter: Payer: Self-pay | Admitting: Nurse Practitioner

## 2022-04-30 ENCOUNTER — Ambulatory Visit: Payer: Medicare HMO | Attending: Nurse Practitioner | Admitting: Nurse Practitioner

## 2022-04-30 VITALS — BP 101/70 | HR 73 | Ht 64.0 in | Wt 132.6 lb

## 2022-04-30 DIAGNOSIS — I4891 Unspecified atrial fibrillation: Secondary | ICD-10-CM

## 2022-04-30 DIAGNOSIS — I6523 Occlusion and stenosis of bilateral carotid arteries: Secondary | ICD-10-CM | POA: Diagnosis not present

## 2022-04-30 DIAGNOSIS — I5032 Chronic diastolic (congestive) heart failure: Secondary | ICD-10-CM

## 2022-04-30 DIAGNOSIS — Z952 Presence of prosthetic heart valve: Secondary | ICD-10-CM | POA: Diagnosis not present

## 2022-04-30 DIAGNOSIS — I2510 Atherosclerotic heart disease of native coronary artery without angina pectoris: Secondary | ICD-10-CM

## 2022-04-30 DIAGNOSIS — I10 Essential (primary) hypertension: Secondary | ICD-10-CM

## 2022-04-30 DIAGNOSIS — E785 Hyperlipidemia, unspecified: Secondary | ICD-10-CM | POA: Diagnosis not present

## 2022-04-30 MED ORDER — METOPROLOL SUCCINATE ER 25 MG PO TB24: 12.5000 mg | ORAL_TABLET | Freq: Every day | ORAL | 6 refills | Status: DC

## 2022-04-30 NOTE — Progress Notes (Addendum)
Office Visit    Patient Name: Emily Rocha Date of Encounter: 04/30/2022  PCP:  Glenda Chroman, MD   [employer]  Cardiologist:  Carlyle Dolly, MD  Advanced Practice Provider:  Finis Bud, NP Electrophysiologist:  None   Chief Complaint    Emily Rocha is a 67 y.o. female with a hx of CAD, s/p NSTEMI and CABG, severe AS, s/p TAVR, GERD, hx of CVA, hx of lung cancer, s/p wedge resection, PVD, COPD, asthma, carotid artery stenosis with TIA's, s/p right CEA and s/p right subclavian and right common carotid bypass [id number], chronic diastolic CHF, hx of syncope (hx of subdural hematoma), and anxiety, who presents today for follow-up.   Past Medical History    Past Medical History:  Diagnosis Date   Anxiety    Asthma    COPD (chronic obstructive pulmonary disease) (HCC)    Coronary artery disease    a. s/p NSTEMI in 11/2016 and required CABG with LIMA-LAD, SVG-LCx and SVG-D1 and complicated by cardiogenic shock b. cath in 05/2020 showing severe RCA stenosis and treated with orbital atherectomy and stent placement   GERD (gastroesophageal reflux disease)    History of CVA (cerebrovascular accident)    History of lung cancer 2020   Peripheral vascular disease ([place])    S/P TAVR (transcatheter aortic valve replacement) 08/12/2020   s/p TAVR with a 23 mm Edwards S3U via the TF approach by Dr. Angelena Form & Dr. Cyndia Bent   Severe aortic stenosis    Past Surgical History:  Procedure Laterality Date   AORTIC ARCH ANGIOGRAPHY N/A 11/04/2017   Procedure: AORTIC ARCH ANGIOGRAPHY;  Surgeon: Elam Dutch, MD;  Location: [place] [facility];  Service: Cardiovascular;  Laterality: N/A;   APPLICATION OF WOUND VAC N/A 01/06/2018   Procedure: APPLICATION OF WOUND VAC;  Surgeon: Ivin Poot, MD;  Location: [place];  Service: Thoracic;  Laterality: N/A;   APPLICATION OF WOUND VAC N/A 01/13/2018   Procedure: WOUND VAC CHANGE;  Surgeon: Ivin Poot, MD;  Location: [place];  Service: Thoracic;  Laterality: N/A;   CARDIAC CATHETERIZATION     CAROTID-SUBCLAVIAN BYPASS GRAFT Right 12/26/2017   Procedure: AORTIC TO RIGHT COMMON CAROTID AND RIGHT SUBCLAVIAN  ARTERY  BYPASS;  Surgeon: Elam Dutch, MD;  Location: [place];  Service: Vascular;  Laterality: Right;   CHOLECYSTECTOMY     CLOSURE OF DIAPHRAGM  12/26/2017   Procedure: REPAIR OF DIAPHRAGM;  Surgeon: Ivin Poot, MD;  Location: [place];  Service: Thoracic;;   COLONOSCOPY N/A 10/03/2014   Procedure: COLONOSCOPY;  Surgeon: Rogene Houston, MD;  [facility];  Service: Endoscopy;  Laterality: N/A;  730   CORONARY ARTERY BYPASS GRAFT N/A 12/14/2016   Procedure: CORONARY ARTERY BYPASS GRAFTING (CABG) x three , using left internal mammary artery and right leg greater saphenous vein harvested endoscopically;  Surgeon: Ivin Poot, MD;  Location: [place];  Service: Open Heart Surgery;  Laterality: N/A;   CORONARY ATHERECTOMY N/A 06/11/2020   Procedure: CORONARY ATHERECTOMY;  Surgeon: Burnell Blanks, MD;  Location: [place] [facility];  Service: Cardiovascular;  Laterality: N/A;   CORONARY STENT INTERVENTION N/A 06/11/2020   Procedure: CORONARY STENT INTERVENTION;  Surgeon: Burnell Blanks, MD;  Location: [place] [facility];  Service: Cardiovascular;  Laterality: N/A;   ENDARTERECTOMY Right 12/06/2014   Procedure: ENDARTERECTOMY CAROTid;  Surgeon: Elam Dutch, MD;  Location: [place];  Service: Vascular;  Laterality:  Right;   ESOPHAGOGASTRODUODENOSCOPY N/A 12/03/2016   Procedure: ESOPHAGOGASTRODUODENOSCOPY (EGD);  Surgeon: Rogene Houston, MD;  [facility];  Service: Endoscopy;  Laterality: N/A;  7:30   INTRAVASCULAR IMAGING/OCT N/A 06/11/2020   Procedure: INTRAVASCULAR IMAGING/OCT;  Surgeon: Burnell Blanks, MD;  Location: [place] [facility];  Service: Cardiovascular;  Laterality: N/A;   INTRAVASCULAR PRESSURE WIRE/FFR STUDY N/A 06/04/2020   Procedure:  INTRAVASCULAR PRESSURE WIRE/FFR STUDY;  Surgeon: Burnell Blanks, MD;  Location: [place] [facility];  Service: Cardiovascular;  Laterality: N/A;   LEFT HEART CATH AND CORONARY ANGIOGRAPHY N/A 12/14/2016   Procedure: LEFT HEART CATH AND CORONARY ANGIOGRAPHY;  Surgeon: Burnell Blanks, MD;  Location: [place] [facility];  Service: Cardiovascular;  Laterality: N/A;   LOBECTOMY Right 10/26/2018   Procedure: RIGHT UPPER LOBECTOMY;  Surgeon: Lajuana Matte, MD;  Location: [place];  Service: Thoracic;  Laterality: Right;   PERIPHERAL VASCULAR CATHETERIZATION N/A 12/05/2014   Procedure:  Carotid Arch Angiography;  Surgeon: Conrad Hopewell, MD;  Location: [place] [facility];  Service: Cardiovascular;  Laterality: N/A;   RIGHT/LEFT HEART CATH AND CORONARY/GRAFT ANGIOGRAPHY N/A 06/04/2020   Procedure: RIGHT/LEFT HEART CATH AND CORONARY/GRAFT ANGIOGRAPHY;  Surgeon: Burnell Blanks, MD;  Location: [place] [facility];  Service: Cardiovascular;  Laterality: N/A;   STERNAL WOUND DEBRIDEMENT N/A 01/06/2018   Procedure: STERNAL WOUND DEBRIDEMENT;  Surgeon: Ivin Poot, MD;  Location: [place];  Service: Thoracic;  Laterality: N/A;   STERNOTOMY N/A 12/26/2017   Procedure: REDO STERNOTOMY;  Surgeon: Prescott Gum, Collier Salina, MD;  Location: [place];  Service: Thoracic;  Laterality: N/A;   TEE WITHOUT CARDIOVERSION N/A 12/14/2016   Procedure: TRANSESOPHAGEAL ECHOCARDIOGRAM (TEE);  Surgeon: Prescott Gum, Collier Salina, MD;  Location: [place];  Service: Open Heart Surgery;  Laterality: N/A;   TEE WITHOUT CARDIOVERSION N/A 08/12/2020   Procedure: TRANSESOPHAGEAL ECHOCARDIOGRAM (TEE);  Surgeon: Burnell Blanks, MD;  Location: [place] [facility];  Service: Open Heart Surgery;  Laterality: N/A;   TRANSCATHETER AORTIC VALVE REPLACEMENT, TRANSFEMORAL N/A 08/12/2020   Procedure: TRANSCATHETER AORTIC VALVE REPLACEMENT, TRANSFEMORAL;  Surgeon: Burnell Blanks, MD;  Location: [place] [facility];  Service: Open Heart  Surgery;  Laterality: N/A;   VIDEO ASSISTED THORACOSCOPY (VATS)/WEDGE RESECTION Right 10/26/2018   Procedure: VIDEO ASSISTED THORACOSCOPY (VATS)/WEDGE RESECTION;  Surgeon: Lajuana Matte, MD;  Location: [place];  Service: Thoracic;  Laterality: Right;   VIDEO BRONCHOSCOPY N/A 10/26/2018   Procedure: VIDEO BRONCHOSCOPY;  Surgeon: Lajuana Matte, MD;  Location: [place];  Service: Thoracic;  Laterality: N/A;    Allergies  No Known Allergies  History of Present Illness    Emily Rocha is a 67 y.o. female with a PMH as mentioned above.  Previous cardiovascular history of CABG (LIMA-LAD, SVG-LCx, SVG-diagonal) after presenting with NSTEMI in 2018, presentation complicated by cardiogenic shock.  Echocardiogram revealed EF 45 to 50%.  Developed wound infection after cardiothoracic surgery, healed.  Echocardiogram the following year revealed normal EF, mild AS, grade 1 DD, mild AI.  NST was negative for ischemia in 2020.  In 2022 she underwent cardiac catheterization as reported below, received PCI to proximal to mid RCA.  Underwent TAVR in June 2022.  Last seen by Dr. Carlyle Dolly on January 25, 2022. Was overall doing well at the time. Denied any chest pain/shortness of breath.   In the interim, was admitted 03/2022 at [facility] and transferred to [facility] ICU for septic shock secondary to multifocal PNA  with ARDS and acute on chronic hypoxic and hypercarbic respiratory failure, as well as acute metabolic encephalopathy. Hospital course complicated by A-fib with RVR, elevated troponins liekly due to demand ischemia. TTE revealed stable EF, normal function of prosthetic valve.   Today she presents for follow-up. States she has stopped taking Eliquis, and has resumed Aspirin 81 mg daily. Denies any chest pain, shortness of breath, palpitations, syncope, presyncope, dizziness, orthopnea, PND, swelling or significant weight changes, acute bleeding, or claudication.   EKGs/Labs/Other Studies Reviewed:    The following studies were reviewed today:   EKG:  EKG is not ordered today.    Echo on 03/30/2022:   1. Left ventricular ejection fraction, by estimation, is 55 to 60%. The  left ventricle has normal function. Left ventricular endocardial border  not optimally defined to evaluate regional wall motion. Diastolic function  indeterminant due to severe MAC.   2. Right ventricular systolic function is moderately reduced. The right  ventricular size is moderately enlarged. There is mildly elevated  pulmonary artery systolic pressure. The estimated right ventricular  systolic pressure is [id number] mmHg.   3. Left atrial size was moderately dilated.   4. Right atrial size was severely dilated.   5. The mitral valve is degenerative. Mild mitral valve regurgitation.  Severe mitral annular calcification.   6. The aortic valve has been repaired/replaced. There is a 23 mm Edwards  Ultra, stented (TAVR) valve present in the aortic position. Procedure  Date: 08/12/20. Echo findings are consistent with normal structure and  function of the aortic valve prosthesis.   Aortic valve mean gradient measures 12.0 mmHg. Aortic valve Vmax measures  2.38 m/s. There is trivial paravalvular leak.   7. Aortic dilatation noted. There is borderline dilatation of the aortic  root, measuring 37 mm.   8. The inferior vena cava is dilated in size with <50% respiratory  variability, suggesting right atrial pressure of 15 mmHg.   9. Tricuspid valve regurgitation is mild to moderate.  Coronary CTA on Jun 26, 2020: 1. Tri leaflet AV severely calcified with restricted leaflet motion Calcium score 3013   2.  Annular area 417.8 mm2 suitable for a 23 mm Sapien 3 valve   3. Dense area of calcification at the base of the left cusp annulus protruding 5 mm into the annulus and extending subvalvular to mitral annulus increases risk of significant peri valvular regurgitation   4. Coronary arteries sufficient height above  annulus for deployment although RCA a bit shallow at 9.6 mm. Patent SVG to OM, Patent SVG to D1 and patent but   Atretic appearing LIMA to distal LAD   5. Optimum angiographic angle for deployment LAO 38 Cranial 11 degrees  Carotid duplex on Jun 24, 2020: Summary:  Right Carotid: Velocities in the right ICA are consistent with a 1-39% stenosis. Hemodynamically significant plaque >50% visualized in the CCA.   Left Carotid: Velocities in the left ICA are consistent with a 1-39% stenosis. Non-hemodynamically significant plaque <50% noted in the CCA.   Vertebrals: Bilateral vertebral arteries demonstrate antegrade flow. Subclavians: Normal flow hemodynamics were seen in bilateral subclavian arteries.  Coronary atherectomy on June 12, 2020:  Ost RPDA to RPDA lesion is 90% stenosed. Dist RCA lesion is 50% stenosed. Mid RCA lesion is 30% stenosed. Prox RCA lesion is 80% stenosed. Post intervention, there is a 0% residual stenosis. A drug-eluting stent was successfully placed using a SYNERGY XD [id number].   1. Severe, heavily calcified stenosis in the proximal to mid RCA  2. Successful PTCA/orbital atherectomy/stenting proximal to mid RCA   Recommendations: Will continue DAPT with ASA and Plavix. Continue workup for TAVR  Right/Left heart cath on 06/04/2020: Ost LM to LM lesion is 99% stenosed. Ost 2nd Diag to 2nd Diag lesion is 40% stenosed. Mid LAD lesion is 30% stenosed. Prox LAD to Mid LAD lesion is 40% stenosed. Ost Cx to Prox Cx lesion is 100% stenosed. Mid RCA lesion is 30% stenosed. Prox RCA lesion is 80% stenosed. Ost RPDA to RPDA lesion is 90% stenosed. Dist RCA lesion is 50% stenosed. 2nd Mrg lesion is 50% stenosed. Mid LM to Prox LAD lesion is 95% stenosed. Mid Graft to Insertion lesion is 100% stenosed.   1. Severe left main stenosis 2. Severe ostial LAD stenosis. Patent Vein graft to the Diagonal branch that fills the LAD. The LIMA in injected and does not fill the LAD  although it may just be atretic distally. It is not clear that the LIMA was anastomosed to the LAD. The operative note indicates the LIMA was used.  3. Chronic occlusion ostial Circumflex. Patent vein graft to the obtuse marginal branch with moderate disease in the obtuse marginal branch.  4. The RCA is a large dominant vessel. There is a heavily calcified proximal stenosis that appears to be severe angiographically. RFR with flow wire was 0.89 suggesting the lesion is flow limiting.  5. Severe aortic stenosis (mean gradient 40.3 mmHg, peak to peak gradient 55 mmHg, AVA 0.75 cm2)   Recommendations: She will need to have staged PCI of the RCA with orbital atherectomy and stenting. I will plan this for next week. After the PCI, will then continue planning for TAVR with CT scans.   Recent Labs: 03/28/2022: B Natriuretic Peptide 1,182.4 04/02/2022: ALT 67; Hemoglobin 12.7; Platelets 224 04/05/2022: BUN 21; Creatinine, Ser 0.55; Magnesium 2.3; Potassium 4.3; Sodium 134  Recent Lipid Panel    Component Value Date/Time   CHOL 113 06/11/2021 0954   TRIG 127 03/29/2022 0542   HDL 48 06/11/2021 0954   CHOLHDL 2.4 06/11/2021 0954   VLDL 28 06/11/2021 0954   LDLCALC 37 06/11/2021 0954    Risk Assessment/Calculations:   CHA2DS2-VASc Score = 6  This indicates a 9.7% annual risk of stroke. The patient's score is based upon: CHF History: 1 HTN History: 0 Diabetes History: 0 Stroke History: 2 Vascular Disease History: 1 Age Score: 1 Gender Score: 1   Home Medications   Current Meds  Medication Sig   albuterol (PROVENTIL) (2.5 MG/3ML) 0.083% nebulizer solution Take 2.5 mg by nebulization every 4 (four) hours as needed for wheezing or shortness of breath.   albuterol (VENTOLIN HFA) 108 (90 Base) MCG/ACT inhaler Inhale 1-2 puffs into the lungs every 6 (six) hours as needed for wheezing or shortness of breath.   ALPRAZolam (XANAX) 0.5 MG tablet Take 0.5 mg by mouth 3 (three) times daily.    Ascorbic Acid (VITAMIN C GUMMIE PO) Chew 500 mg by mouth daily.   aspirin EC 81 MG tablet Take 81 mg by mouth daily. Swallow whole.   atorvastatin (LIPITOR) 40 MG tablet Take 1 tablet (40 mg total) daily by mouth.   cholecalciferol (VITAMIN D3) 25 MCG (1000 UNIT) tablet Take 1,000 Units by mouth in the morning.   diphenhydramine-acetaminophen (TYLENOL PM) 25-500 MG TABS tablet Take 2 tablets by mouth at bedtime.   magnesium oxide (MAG-OX) 400 (240 Mg) MG tablet Take 400 mg by mouth daily.   melatonin 3 MG TABS tablet Take 1 tablet (  3 mg total) by mouth at bedtime.   Multiple Vitamin (MULTIVITAMIN WITH MINERALS) TABS tablet Take 1 tablet by mouth daily.   pantoprazole (PROTONIX) 40 MG tablet Take 40 mg by mouth 2 (two) times daily.   potassium chloride SA (KLOR-CON) 20 MEQ tablet Take 20 mEq by mouth in the morning.   torsemide (DEMADEX) 20 MG tablet Take 40 mg by mouth as needed (based on her weight).   TRELEGY ELLIPTA 100-62.5-25 MCG/ACT AEPB SMARTSIG:1 Inhalation Via Inhaler Daily   metoprolol succinate (TOPROL-XL) 50 MG 24 hr tablet Take 1.5 tablets (75 mg total) by mouth daily. Take with or immediately following a meal.     Review of Systems    All other systems reviewed and are otherwise negative except as noted above.  Physical Exam    VS:  BP 101/70 (BP Location: Left Arm, Patient Position: Sitting, Cuff Size: Normal)   Pulse 73   Ht 5\' 4"  (1.626 m)   Wt 132 lb 9.6 oz (60.1 kg)   SpO2 92%   BMI 22.76 kg/m  , BMI Body mass index is 22.76 kg/m.  Wt Readings from Last 3 Encounters:  04/30/22 132 lb 9.6 oz (60.1 kg)  04/05/22 135 lb 12.9 oz (61.6 kg)  01/25/22 140 lb (63.5 kg)     GEN: Well nourished, well developed, in no acute distress. HEENT: normal. Neck: Supple, no JVD, carotid bruits, or masses. Cardiac: S1/S2, RRR, no murmurs, rubs, or gallops. No clubbing, cyanosis, edema.  Radials/PT 2+ and equal bilaterally.  Respiratory:  Respirations regular and unlabored, clear  to auscultation bilaterally. MS: No deformity or atrophy. Skin: Warm and dry, no rash. Neuro:  Strength and sensation are intact. Psych: Normal affect.  Assessment & Plan    A-fib with RVR Recent hospital course complicated by A-fib with RVR in setting of sepsis with multifocal PNA. Was started on Eliquis during hospitalization; however, pt states she has stopped Eliquis and is just taking ASA 81 mg daily. Past hx of subdural hematoma from syncopal episode and was previously recommended to d/c Eliquis in past. Will route note to Dr. Carlyle Dolly to verify remaining on ASA 81 mg daily only. HR controlled and pt denies any tachycardia / palpitations. No evidence of A-fib on exam. Reducing Metoprolol succinate as mentioned below. Heart healthy diet and regular cardiovascular exercise encouraged.   Addendum 05/17/2022: Pt refuses to go back on anticoagulation, wants to just remain on Aspirin. Dr. Harl Bowie has been made aware. Previously discussed stroke risk and pt verbalized understanding. Per Dr. Carlyle Dolly, no monitor needs to be arranged at this time.  Chronic HFpEF TTE 03/2022 revealed EF 55-60%. Euvolemic and well compensated on exam. Continue current medications. Reducing Metoprolol as mentioned below. Low sodium diet, fluid restriction <2L, and daily weights encouraged. Educated to contact our office for weight gain of 2 lbs overnight or 5 lbs in one week. Heart healthy diet and regular cardiovascular exercise encouraged.   Severe AS, s/p TAVR Normal structure and function noted of aortic valve prosthesis on Echo 03/2022. Reducing Toprol XL, no other medication changes. SBE prophylaxis discussed. Heart healthy diet and regular cardiovascular exercise encouraged.   CAD Stable with no anginal symptoms. No indication for ischemic evaluation. Continue ASA, atorvastatin, and reducing metoprolol as mentioned below. Heart healthy diet and regular cardiovascular exercise encouraged.   Carotid  stenosis Doppler 2022 revealed 1-39% bilateral ICA stenosis. Continue current medication regimen and continue to follow-up with [facility].   HLD LDL 30 from 2023. Continue  atorvastatin. Heart healthy diet and regular cardiovascular exercise encouraged.   HTN BP on arrival 92/58, repeat BP 101/70. Will reduce Toprol XL to 12.5 mg daily. Discussed to monitor BP at home at least 2 hours after medications and sitting for 5-10 minutes. Heart healthy diet and regular cardiovascular exercise encouraged.   Disposition: Follow up in 6 month(s) with Carlyle Dolly, MD or APP.  Signed, Finis Bud, NP 05/02/2022, 10:35 PM [facility]

## 2022-04-30 NOTE — Patient Instructions (Addendum)
Medication Instructions:   Decrease Toprol XL to 12.5mg daily.   Continue all other medications.    Labwork: none  Testing/Procedures: none  Follow-Up: 6 months   Any Other Special Instructions Will Be Listed Below (If Applicable).  If you need a refill on your cardiac medications before your next appointment, please call your pharmacy.  

## 2022-05-03 DIAGNOSIS — I25119 Atherosclerotic heart disease of native coronary artery with unspecified angina pectoris: Secondary | ICD-10-CM | POA: Diagnosis not present

## 2022-05-03 DIAGNOSIS — F419 Anxiety disorder, unspecified: Secondary | ICD-10-CM | POA: Diagnosis not present

## 2022-05-03 DIAGNOSIS — I272 Pulmonary hypertension, unspecified: Secondary | ICD-10-CM | POA: Diagnosis not present

## 2022-05-03 DIAGNOSIS — Z299 Encounter for prophylactic measures, unspecified: Secondary | ICD-10-CM | POA: Diagnosis not present

## 2022-05-03 DIAGNOSIS — F1320 Sedative, hypnotic or anxiolytic dependence, uncomplicated: Secondary | ICD-10-CM | POA: Diagnosis not present

## 2022-05-14 NOTE — Progress Notes (Signed)
Patient states that she does not want to go back on the blood thinner. States that she has been back on aspirin 81 mg for a month and feels a lot better. Feels better since BP medication was cut down to half and does not have any dizziness or lightheadedness.

## 2022-05-17 ENCOUNTER — Emergency Department (HOSPITAL_COMMUNITY)
Admission: EM | Admit: 2022-05-17 | Discharge: 2022-05-17 | Disposition: A | Discharge status: Home / Self Care | Payer: Medicare HMO | Attending: Student | Admitting: Student

## 2022-05-17 ENCOUNTER — Telehealth: Payer: Self-pay | Admitting: Cardiology

## 2022-05-17 ENCOUNTER — Emergency Department (HOSPITAL_COMMUNITY): Payer: Medicare HMO

## 2022-05-17 ENCOUNTER — Other Ambulatory Visit: Payer: Self-pay

## 2022-05-17 DIAGNOSIS — Z7982 Long term (current) use of aspirin: Secondary | ICD-10-CM | POA: Insufficient documentation

## 2022-05-17 DIAGNOSIS — Z85118 Personal history of other malignant neoplasm of bronchus and lung: Secondary | ICD-10-CM | POA: Diagnosis not present

## 2022-05-17 DIAGNOSIS — Z951 Presence of aortocoronary bypass graft: Secondary | ICD-10-CM | POA: Diagnosis not present

## 2022-05-17 DIAGNOSIS — K439 Ventral hernia without obstruction or gangrene: Secondary | ICD-10-CM | POA: Diagnosis not present

## 2022-05-17 DIAGNOSIS — F17210 Nicotine dependence, cigarettes, uncomplicated: Secondary | ICD-10-CM | POA: Diagnosis not present

## 2022-05-17 DIAGNOSIS — I2510 Atherosclerotic heart disease of native coronary artery without angina pectoris: Secondary | ICD-10-CM | POA: Diagnosis not present

## 2022-05-17 DIAGNOSIS — J449 Chronic obstructive pulmonary disease, unspecified: Secondary | ICD-10-CM | POA: Insufficient documentation

## 2022-05-17 DIAGNOSIS — R109 Unspecified abdominal pain: Secondary | ICD-10-CM | POA: Diagnosis not present

## 2022-05-17 DIAGNOSIS — Z8673 Personal history of transient ischemic attack (TIA), and cerebral infarction without residual deficits: Secondary | ICD-10-CM | POA: Insufficient documentation

## 2022-05-17 DIAGNOSIS — I700 Atherosclerosis of aorta: Secondary | ICD-10-CM | POA: Diagnosis not present

## 2022-05-17 LAB — CBC WITH DIFFERENTIAL/PLATELET
Abs Immature Granulocytes: 0.02 10*3/uL (ref 0.00–0.07)
Basophils Absolute: 0 10*3/uL (ref 0.0–0.1)
Basophils Relative: 1 %
Eosinophils Absolute: 0 10*3/uL (ref 0.0–0.5)
Eosinophils Relative: 0 %
HCT: 33.7 % — ABNORMAL LOW (ref 36.0–46.0)
Hemoglobin: 10.3 g/dL — ABNORMAL LOW (ref 12.0–15.0)
Immature Granulocytes: 0 %
Lymphocytes Relative: 30 %
Lymphs Abs: 2 10*3/uL (ref 0.7–4.0)
MCH: 25.6 pg — ABNORMAL LOW (ref 26.0–34.0)
MCHC: 30.6 g/dL (ref 30.0–36.0)
MCV: 83.8 fL (ref 80.0–100.0)
Monocytes Absolute: 0.7 10*3/uL (ref 0.1–1.0)
Monocytes Relative: 10 %
Neutro Abs: 4 10*3/uL (ref 1.7–7.7)
Neutrophils Relative %: 59 %
Platelets: 178 10*3/uL (ref 150–400)
RBC: 4.02 MIL/uL (ref 3.87–5.11)
RDW: 17.2 % — ABNORMAL HIGH (ref 11.5–15.5)
WBC: 6.8 10*3/uL (ref 4.0–10.5)
nRBC: 0 % (ref 0.0–0.2)

## 2022-05-17 LAB — COMPREHENSIVE METABOLIC PANEL
ALT: 18 U/L (ref 0–44)
AST: 20 U/L (ref 15–41)
Albumin: 4 g/dL (ref 3.5–5.0)
Alkaline Phosphatase: 82 U/L (ref 38–126)
Anion gap: 9 (ref 5–15)
BUN: 21 mg/dL (ref 8–23)
CO2: 29 mmol/L (ref 22–32)
Calcium: 9.1 mg/dL (ref 8.9–10.3)
Chloride: 99 mmol/L (ref 98–111)
Creatinine, Ser: 0.76 mg/dL (ref 0.44–1.00)
GFR, Estimated: >60 mL/min (ref >60)
Glucose, Bld: 73 mg/dL (ref 70–99)
Potassium: 3.7 mmol/L (ref 3.5–5.1)
Sodium: 137 mmol/L (ref 135–145)
Total Bilirubin: 0.8 mg/dL (ref 0.3–1.2)
Total Protein: 7.3 g/dL (ref 6.5–8.1)

## 2022-05-17 LAB — URINALYSIS, ROUTINE W REFLEX MICROSCOPIC
Bilirubin Urine: NEGATIVE
Glucose, UA: NEGATIVE mg/dL
Hgb urine dipstick: NEGATIVE
Ketones, ur: NEGATIVE mg/dL
Leukocytes,Ua: NEGATIVE
Nitrite: NEGATIVE
Protein, ur: NEGATIVE mg/dL
Specific Gravity, Urine: 1.009 (ref 1.005–1.030)
pH: 6 (ref 5.0–8.0)

## 2022-05-17 LAB — LIPASE, BLOOD: Lipase: 31 U/L (ref 11–51)

## 2022-05-17 MED ORDER — IOHEXOL 300 MG/ML  SOLN
100.0000 mL | Freq: Once | INTRAMUSCULAR | Status: AC | PRN
  Administered 2022-05-17: 100 mL via INTRAVENOUS

## 2022-05-17 MED ORDER — FENTANYL CITRATE PF 50 MCG/ML IJ SOSY
50.0000 ug | PREFILLED_SYRINGE | Freq: Once | INTRAMUSCULAR | Status: AC
  Administered 2022-05-17: 50 ug via INTRAVENOUS
  Filled 2022-05-17: qty 1

## 2022-05-17 NOTE — Telephone Encounter (Signed)
Pt states that she was advised by Dr. Nelly Laurence assistant to start on blood thinners, by Dr. Harl Bowie. She states that she didn't think to ask the person who called her why she needed to go back on blood thinners, so she would like a call back explaining why.

## 2022-05-17 NOTE — ED Triage Notes (Signed)
Pt was laughing hard at work and all of sudden she has upper abd pain with a painful bulge now

## 2022-05-17 NOTE — ED Provider Notes (Signed)
[facility] Provider Note  CSN: [id number] Arrival date & time: 05/17/22 1659  Chief Complaint(s) Abdominal Pain  HPI Emily Rocha is a 67 y.o. female with PMH COPD, CAD status post CABG, CVA, aortic stenosis status post TAVR, recent hospital admission in February 2024 for severe sepsis secondary to pneumonia requiring intubation hiatal hernia who presents emergency room for evaluation of abdominal pain.  Patient states that she was at work, had a large laugh followed by sudden onset abdominal pain and distention.  She states that she noticed abdominal distention the size of a "softball" that was hard and tender to palpation.  She states that she got very sweaty and almost passed out for the pain.  She states that since lying flat her pain has improved.  She currently endorses persistent epigastric abdominal pain but denies chest pain, shortness of breath, headache, fever or other systemic symptoms.   Past Medical History Past Medical History:  Diagnosis Date   Anxiety    Asthma    COPD (chronic obstructive pulmonary disease) ([place])    Coronary artery disease    a. s/p NSTEMI in 11/2016 and required CABG with LIMA-LAD, SVG-LCx and SVG-D1 and complicated by cardiogenic shock b. cath in 05/2020 showing severe RCA stenosis and treated with orbital atherectomy and stent placement   GERD (gastroesophageal reflux disease)    History of CVA (cerebrovascular accident)    History of lung cancer 2020   Peripheral vascular disease ([place])    S/P TAVR (transcatheter aortic valve replacement) 08/12/2020   s/p TAVR with a 23 mm Edwards S3U via the TF approach by Dr. Angelena Form & Dr. Cyndia Bent   Severe aortic stenosis    Patient Active Problem List   Diagnosis Date Noted   Respiratory failure 03/28/2022   Acute respiratory failure with hypercapnia 03/28/2022   Elevated LFTs 03/28/2022   Elevated troponin 03/28/2022   Hyperkalemia 03/28/2022   Sepsis with acute  hypoxic respiratory failure and septic shock 03/28/2022   Multifocal pneumonia 03/28/2022   History of CVA (cerebrovascular accident) 08/12/2020   COPD (chronic obstructive pulmonary disease) 08/12/2020   S/P TAVR (transcatheter aortic valve replacement) 08/12/2020   CAD S/P percutaneous coronary angioplasty 06/11/2020   Severe aortic stenosis 05/19/2020   Chronic respiratory failure with hypoxia 11/13/2019   Chronic diastolic CHF (congestive heart failure) 11/13/2019   Subdural hematoma 11/12/2019   Lip laceration 11/12/2019   Hypoalbuminemia 11/12/2019   Hiatal hernia 11/02/2019   History of lung cancer- Non-Small cell/S/p Prior VATs 10/24/2019   CAD (coronary artery disease)/Prior CABG 10/24/2019   S/P lobectomy of lung 10/26/2018   S/P CABG x 3 12/14/2016   GERD (gastroesophageal reflux disease) 12/12/2016   Carotid stenosis 12/19/2014   Home Medication(s) Prior to Admission medications   Medication Sig Start Date End Date Taking? Authorizing Provider  albuterol (PROVENTIL) (2.5 MG/3ML) 0.083% nebulizer solution Take 2.5 mg by nebulization every 4 (four) hours as needed for wheezing or shortness of breath. 03/30/19   [provider]  albuterol (VENTOLIN HFA) 108 (90 Base) MCG/ACT inhaler Inhale 1-2 puffs into the lungs every 6 (six) hours as needed for wheezing or shortness of breath.    [provider]  ALPRAZolam Duanne Moron) 0.5 MG tablet Take 0.5 mg by mouth 3 (three) times daily.    [provider]  Ascorbic Acid (VITAMIN C GUMMIE PO) Chew 500 mg by mouth daily.    [provider]  aspirin EC 81 MG tablet Take 81  mg by mouth daily. Swallow whole.    [provider]  atorvastatin (LIPITOR) 40 MG tablet Take 1 tablet (40 mg total) daily by mouth. 12/27/16   Gold, Wilder Glade, PA-C  cholecalciferol (VITAMIN D3) 25 MCG (1000 UNIT) tablet Take 1,000 Units by mouth in the morning.    [provider]  diphenhydramine-acetaminophen (TYLENOL  PM) 25-500 MG TABS tablet Take 2 tablets by mouth at bedtime.    [provider]  magnesium oxide (MAG-OX) 400 (240 Mg) MG tablet Take 400 mg by mouth daily.    [provider]  melatonin 3 MG TABS tablet Take 1 tablet (3 mg total) by mouth at bedtime. 04/06/22   Ghimire, Henreitta Leber, MD  metoprolol succinate (TOPROL-XL) 25 MG 24 hr tablet Take 0.5 tablets (12.5 mg total) by mouth daily. 04/30/22   Finis Bud, NP  Multiple Vitamin (MULTIVITAMIN WITH MINERALS) TABS tablet Take 1 tablet by mouth daily. 12/01/18   Nani Skillern, PA-C  pantoprazole (PROTONIX) 40 MG tablet Take 40 mg by mouth 2 (two) times daily.    [provider]  potassium chloride SA (KLOR-CON) 20 MEQ tablet Take 20 mEq by mouth in the morning.    [provider]  torsemide (DEMADEX) 20 MG tablet Take 40 mg by mouth as needed (based on her weight).    [provider]  Donnal Debar 100-62.5-25 MCG/ACT AEPB SMARTSIG:1 Inhalation Via Inhaler Daily 01/04/22   [provider]                                                                                                                                    Past Surgical History Past Surgical History:  Procedure Laterality Date   AORTIC ARCH ANGIOGRAPHY N/A 11/04/2017   Procedure: AORTIC ARCH ANGIOGRAPHY;  Surgeon: Elam Dutch, MD;  Location: [facility];  Service: Cardiovascular;  Laterality: N/A;   APPLICATION OF WOUND VAC N/A 01/06/2018   Procedure: APPLICATION OF WOUND VAC;  Surgeon: Ivin Poot, MD;  Location: [facility];  Service: Thoracic;  Laterality: N/A;   APPLICATION OF WOUND VAC N/A 01/13/2018   Procedure: WOUND VAC CHANGE;  Surgeon: Ivin Poot, MD;  Location: [facility];  Service: Thoracic;  Laterality: N/A;   CARDIAC CATHETERIZATION     CAROTID-SUBCLAVIAN BYPASS GRAFT Right 12/26/2017   Procedure: AORTIC TO RIGHT COMMON CAROTID AND RIGHT SUBCLAVIAN  ARTERY  BYPASS;  Surgeon: Elam Dutch,  MD;  Location: [facility];  Service: Vascular;  Laterality: Right;   CHOLECYSTECTOMY     CLOSURE OF DIAPHRAGM  12/26/2017   Procedure: REPAIR OF DIAPHRAGM;  Surgeon: Ivin Poot, MD;  Location: [facility];  Service: Thoracic;;   COLONOSCOPY N/A 10/03/2014   Procedure: COLONOSCOPY;  Surgeon: Rogene Houston, MD;  [facility];  Service: Endoscopy;  Laterality: N/A;  730   CORONARY ARTERY BYPASS GRAFT N/A 12/14/2016   Procedure: CORONARY  ARTERY BYPASS GRAFTING (CABG) x three , using left internal mammary artery and right leg greater saphenous vein harvested endoscopically;  Surgeon: Ivin Poot, MD;  Location: [facility];  Service: Open Heart Surgery;  Laterality: N/A;   CORONARY ATHERECTOMY N/A 06/11/2020   Procedure: CORONARY ATHERECTOMY;  Surgeon: Burnell Blanks, MD;  Location: [facility];  Service: Cardiovascular;  Laterality: N/A;   CORONARY IMAGING/OCT N/A 06/11/2020   Procedure: INTRAVASCULAR IMAGING/OCT;  Surgeon: Burnell Blanks, MD;  Location: [facility];  Service: Cardiovascular;  Laterality: N/A;   CORONARY STENT INTERVENTION N/A 06/11/2020   Procedure: CORONARY STENT INTERVENTION;  Surgeon: Burnell Blanks, MD;  Location: [facility];  Service: Cardiovascular;  Laterality: N/A;   ENDARTERECTOMY Right 12/06/2014   Procedure: ENDARTERECTOMY CAROTid;  Surgeon: Elam Dutch, MD;  Location: [facility];  Service: Vascular;  Laterality: Right;   ESOPHAGOGASTRODUODENOSCOPY N/A 12/03/2016   Procedure: ESOPHAGOGASTRODUODENOSCOPY (EGD);  Surgeon: Rogene Houston, MD;  [facility];  Service: Endoscopy;  Laterality: N/A;  7:30   INTRAVASCULAR PRESSURE WIRE/FFR STUDY N/A 06/04/2020   Procedure: INTRAVASCULAR PRESSURE WIRE/FFR STUDY;  Surgeon: Burnell Blanks, MD;  Location: [facility];  Service: Cardiovascular;  Laterality: N/A;   LEFT HEART CATH AND CORONARY ANGIOGRAPHY N/A 12/14/2016   Procedure: LEFT HEART CATH AND  CORONARY ANGIOGRAPHY;  Surgeon: Burnell Blanks, MD;  Location: [facility];  Service: Cardiovascular;  Laterality: N/A;   LOBECTOMY Right 10/26/2018   Procedure: RIGHT UPPER LOBECTOMY;  Surgeon: Lajuana Matte, MD;  Location: [facility];  Service: Thoracic;  Laterality: Right;   PERIPHERAL VASCULAR CATHETERIZATION N/A 12/05/2014   Procedure:  Carotid Arch Angiography;  Surgeon: Conrad Elyria, MD;  Location: [facility];  Service: Cardiovascular;  Laterality: N/A;   RIGHT/LEFT HEART CATH AND CORONARY/GRAFT ANGIOGRAPHY N/A 06/04/2020   Procedure: RIGHT/LEFT HEART CATH AND CORONARY/GRAFT ANGIOGRAPHY;  Surgeon: Burnell Blanks, MD;  Location: [facility];  Service: Cardiovascular;  Laterality: N/A;   STERNAL WOUND DEBRIDEMENT N/A 01/06/2018   Procedure: STERNAL WOUND DEBRIDEMENT;  Surgeon: Ivin Poot, MD;  Location: [facility];  Service: Thoracic;  Laterality: N/A;   STERNOTOMY N/A 12/26/2017   Procedure: REDO STERNOTOMY;  Surgeon: Prescott Gum, Collier Salina, MD;  Location: [facility];  Service: Thoracic;  Laterality: N/A;   TEE WITHOUT CARDIOVERSION N/A 12/14/2016   Procedure: TRANSESOPHAGEAL ECHOCARDIOGRAM (TEE);  Surgeon: Prescott Gum, Collier Salina, MD;  Location: [facility];  Service: Open Heart Surgery;  Laterality: N/A;   TEE WITHOUT CARDIOVERSION N/A 08/12/2020   Procedure: TRANSESOPHAGEAL ECHOCARDIOGRAM (TEE);  Surgeon: Burnell Blanks, MD;  Location: [facility];  Service: Open Heart Surgery;  Laterality: N/A;   TRANSCATHETER AORTIC VALVE REPLACEMENT, TRANSFEMORAL N/A 08/12/2020   Procedure: TRANSCATHETER AORTIC VALVE REPLACEMENT, TRANSFEMORAL;  Surgeon: Burnell Blanks, MD;  Location: [facility];  Service: Open Heart Surgery;  Laterality: N/A;   VIDEO ASSISTED THORACOSCOPY (VATS)/WEDGE RESECTION Right 10/26/2018   Procedure: VIDEO ASSISTED THORACOSCOPY (VATS)/WEDGE RESECTION;  Surgeon: Lajuana Matte, MD;  Location: [facility];  Service: Thoracic;   Laterality: Right;   VIDEO BRONCHOSCOPY N/A 10/26/2018   Procedure: VIDEO BRONCHOSCOPY;  Surgeon: Lajuana Matte, MD;  [facility];  Service: Thoracic;  Laterality: N/A;   Family History Family History  Problem Relation Age of Onset   Heart attack Mother    Cancer Mother    Diabetes Mother    Heart disease Mother  Heart attack Father    Heart disease Father     Social History Social History   Tobacco Use   Smoking status: Former    Years: 40    Types: Cigarettes    Quit date: 03/28/2022    Years since quitting: 0.1   Smokeless tobacco: Never   Tobacco comments:    Patient started back smoking 4 months ago. She is currently smoking 10 cigarettes/day and is trying to quit.   Vaping Use   Vaping Use: Former   Start date: 10/23/2013   Quit date: 10/24/2015  Substance Use Topics   Alcohol use: No    Alcohol/week: 0.0 standard drinks of alcohol   Drug use: No   Allergies Patient has no known allergies.  Review of Systems Review of Systems  Constitutional:  Positive for diaphoresis.  Gastrointestinal:  Positive for abdominal pain.    Physical Exam Vital Signs  I have reviewed the triage vital signs BP (!) 170/75 (BP Location: Right Arm)   Pulse (!) 57   Temp 98.1 F (36.7 C)   Resp 20   Ht 5\' 4"  (1.626 m)   Wt 61.7 kg   SpO2 95%   BMI 23.34 kg/m   Physical Exam Vitals and nursing note reviewed.  Constitutional:      General: She is not in acute distress.    Appearance: She is well-developed.  HENT:     Head: Normocephalic and atraumatic.  Eyes:     Conjunctiva/sclera: Conjunctivae normal.  Cardiovascular:     Rate and Rhythm: Normal rate and regular rhythm.     Heart sounds: No murmur heard. Pulmonary:     Effort: Pulmonary effort is normal. No respiratory distress.  Abdominal:     Tenderness: There is abdominal tenderness in the epigastric area.     Hernia: A hernia is present.  Musculoskeletal:        General: No swelling.     Cervical  back: Neck supple.  Skin:    General: Skin is warm and dry.     Capillary Refill: Capillary refill takes less than 2 seconds.  Neurological:     Mental Status: She is alert.  Psychiatric:        Mood and Affect: Mood normal.     ED Results and Treatments Labs (all labs ordered are listed, but only abnormal results are displayed) Labs Reviewed  CBC WITH DIFFERENTIAL/PLATELET  COMPREHENSIVE METABOLIC PANEL  LIPASE, BLOOD  URINALYSIS, ROUTINE W REFLEX MICROSCOPIC                                                                                                                          Radiology No results found.  Pertinent labs & imaging results that were available during my care of the patient were reviewed by me and considered in my medical decision making (see MDM for details).  Medications Ordered in ED Medications  fentaNYL (SUBLIMAZE) injection 50 mcg (has no administration in time range)  Procedures Procedures  (including critical care time)  Medical Decision Making / ED Course   This patient presents to the ED for concern of abdominal pain and distention, this involves an extensive number of treatment options, and is a complaint that carries with it a high risk of complications and morbidity.  The differential diagnosis includes ventral hernia, incarcerated hernia, strangulated hernia, AAA, pancreatitis, obstruction  MDM: Patient seen emergency room for evaluation of abdominal pain and distention.  Physical exam with palpable ventral hernia that appears to be easily reducible but is tender to palpation.  Laboratory evaluation is reassuring outside of a hemoglobin of 10.3 which is slightly decreased from baseline for the patient.  CT abdomen pelvis showing stable right upper quadrant ventral hernias with no evidence of incarceration or  strangulation.  Patient pain controlled and on reevaluation symptoms significant improved.  I gave patient education on how to reduce hernias at home, and discussed return precautions.  Patient discharged with outpatient follow-up.   Additional history obtained: -Additional history obtained from friend -External records from outside source obtained and reviewed including: Chart review including previous notes, labs, imaging, consultation notes   Lab Tests: -I ordered, reviewed, and interpreted labs.   The pertinent results include:   Labs Reviewed  CBC WITH DIFFERENTIAL/PLATELET  COMPREHENSIVE METABOLIC PANEL  LIPASE, BLOOD  URINALYSIS, ROUTINE W REFLEX MICROSCOPIC      Imaging Studies ordered: I ordered imaging studies including CT abdomen pelvis I independently visualized and interpreted imaging. I agree with the radiologist interpretation   Medicines ordered and prescription drug management: Meds ordered this encounter  Medications   fentaNYL (SUBLIMAZE) injection 50 mcg    -I have reviewed the patients home medicines and have made adjustments as needed  Critical interventions none     Cardiac Monitoring: The patient was maintained on a cardiac monitor.  I personally viewed and interpreted the cardiac monitored which showed an underlying rhythm of: NSR  Social Determinants of Health:  Factors impacting patients care include: none   Reevaluation: After the interventions noted above, I reevaluated the patient and found that they have :improved  Co morbidities that complicate the patient evaluation  Past Medical History:  Diagnosis Date   Anxiety    Asthma    COPD (chronic obstructive pulmonary disease) ([place])    Coronary artery disease    a. s/p NSTEMI in 11/2016 and required CABG with LIMA-LAD, SVG-LCx and SVG-D1 and complicated by cardiogenic shock b. cath in 05/2020 showing severe RCA stenosis and treated with orbital atherectomy and stent placement   GERD  (gastroesophageal reflux disease)    History of CVA (cerebrovascular accident)    History of lung cancer 2020   Peripheral vascular disease ([place])    S/P TAVR (transcatheter aortic valve replacement) 08/12/2020   s/p TAVR with a 23 mm Edwards S3U via the TF approach by Dr. Angelena Form & Dr. Cyndia Bent   Severe aortic stenosis       Dispostion: I considered admission for this patient, but at this time she does not meet inpatient criteria she is safe for discharge with outpatient     Final Clinical Impression(s) / ED Diagnoses Final diagnoses:  None     @PCDICTATION @    Teressa Lower, MD 05/18/22 1141

## 2022-05-17 NOTE — Telephone Encounter (Signed)
Patient notified and verbalized understanding. 

## 2022-05-18 ENCOUNTER — Telehealth: Payer: Self-pay | Admitting: *Deleted

## 2022-05-18 NOTE — Telephone Encounter (Signed)
Patient contacted the office stating she was seen in the ED yesterday d/t abdominal pain brought on by laughing. Patient states imaging showed a Ventral Hernia. Patient concerned and wanted to make Dr. Kipp Brood aware. Advised patient that ventral hernia and hiatal hernia are two different hernias. Advised patient to follow up with primary care MD regarding treatment of the ventral hernia. Patient verbalized understanding.

## 2022-06-29 DIAGNOSIS — I4891 Unspecified atrial fibrillation: Secondary | ICD-10-CM | POA: Diagnosis not present

## 2022-06-29 DIAGNOSIS — J9611 Chronic respiratory failure with hypoxia: Secondary | ICD-10-CM | POA: Diagnosis not present

## 2022-06-29 DIAGNOSIS — I779 Disorder of arteries and arterioles, unspecified: Secondary | ICD-10-CM | POA: Diagnosis not present

## 2022-06-29 DIAGNOSIS — J440 Chronic obstructive pulmonary disease with acute lower respiratory infection: Secondary | ICD-10-CM | POA: Diagnosis not present

## 2022-08-02 DIAGNOSIS — Z299 Encounter for prophylactic measures, unspecified: Secondary | ICD-10-CM | POA: Diagnosis not present

## 2022-08-02 DIAGNOSIS — K432 Incisional hernia without obstruction or gangrene: Secondary | ICD-10-CM | POA: Diagnosis not present

## 2022-08-02 DIAGNOSIS — L821 Other seborrheic keratosis: Secondary | ICD-10-CM | POA: Diagnosis not present

## 2022-08-02 DIAGNOSIS — I25119 Atherosclerotic heart disease of native coronary artery with unspecified angina pectoris: Secondary | ICD-10-CM | POA: Diagnosis not present

## 2022-08-02 DIAGNOSIS — F419 Anxiety disorder, unspecified: Secondary | ICD-10-CM | POA: Diagnosis not present

## 2022-08-23 DIAGNOSIS — I700 Atherosclerosis of aorta: Secondary | ICD-10-CM | POA: Diagnosis not present

## 2022-08-23 DIAGNOSIS — R5383 Other fatigue: Secondary | ICD-10-CM | POA: Diagnosis not present

## 2022-08-23 DIAGNOSIS — J449 Chronic obstructive pulmonary disease, unspecified: Secondary | ICD-10-CM | POA: Diagnosis not present

## 2022-08-23 DIAGNOSIS — Z1339 Encounter for screening examination for other mental health and behavioral disorders: Secondary | ICD-10-CM | POA: Diagnosis not present

## 2022-08-23 DIAGNOSIS — I779 Disorder of arteries and arterioles, unspecified: Secondary | ICD-10-CM | POA: Diagnosis not present

## 2022-08-23 DIAGNOSIS — Z87891 Personal history of nicotine dependence: Secondary | ICD-10-CM | POA: Diagnosis not present

## 2022-08-23 DIAGNOSIS — Z79899 Other long term (current) drug therapy: Secondary | ICD-10-CM | POA: Diagnosis not present

## 2022-08-23 DIAGNOSIS — Z1331 Encounter for screening for depression: Secondary | ICD-10-CM | POA: Diagnosis not present

## 2022-08-23 DIAGNOSIS — Z299 Encounter for prophylactic measures, unspecified: Secondary | ICD-10-CM | POA: Diagnosis not present

## 2022-08-23 DIAGNOSIS — Z0000 Encounter for general adult medical examination without abnormal findings: Secondary | ICD-10-CM | POA: Diagnosis not present

## 2022-08-23 DIAGNOSIS — Z7189 Other specified counseling: Secondary | ICD-10-CM | POA: Diagnosis not present

## 2022-08-23 DIAGNOSIS — E7800 Pure hypercholesterolemia, unspecified: Secondary | ICD-10-CM | POA: Diagnosis not present

## 2022-10-26 DIAGNOSIS — I700 Atherosclerosis of aorta: Secondary | ICD-10-CM | POA: Diagnosis not present

## 2022-10-26 DIAGNOSIS — Z0000 Encounter for general adult medical examination without abnormal findings: Secondary | ICD-10-CM | POA: Diagnosis not present

## 2022-10-26 DIAGNOSIS — J449 Chronic obstructive pulmonary disease, unspecified: Secondary | ICD-10-CM | POA: Diagnosis not present

## 2022-10-26 DIAGNOSIS — F17210 Nicotine dependence, cigarettes, uncomplicated: Secondary | ICD-10-CM | POA: Diagnosis not present

## 2022-10-26 DIAGNOSIS — I25119 Atherosclerotic heart disease of native coronary artery with unspecified angina pectoris: Secondary | ICD-10-CM | POA: Diagnosis not present

## 2022-10-26 DIAGNOSIS — I779 Disorder of arteries and arterioles, unspecified: Secondary | ICD-10-CM | POA: Diagnosis not present

## 2022-10-26 DIAGNOSIS — Z299 Encounter for prophylactic measures, unspecified: Secondary | ICD-10-CM | POA: Diagnosis not present

## 2022-11-24 ENCOUNTER — Other Ambulatory Visit: Payer: Self-pay | Admitting: Nurse Practitioner

## 2023-01-20 DIAGNOSIS — H353132 Nonexudative age-related macular degeneration, bilateral, intermediate dry stage: Secondary | ICD-10-CM | POA: Diagnosis not present

## 2023-02-02 DIAGNOSIS — F419 Anxiety disorder, unspecified: Secondary | ICD-10-CM | POA: Diagnosis not present

## 2023-02-02 DIAGNOSIS — J449 Chronic obstructive pulmonary disease, unspecified: Secondary | ICD-10-CM | POA: Diagnosis not present

## 2023-02-23 ENCOUNTER — Other Ambulatory Visit: Payer: Self-pay | Admitting: Thoracic Surgery (Cardiothoracic Vascular Surgery)

## 2023-02-23 DIAGNOSIS — Z902 Acquired absence of lung [part of]: Secondary | ICD-10-CM

## 2023-03-21 DIAGNOSIS — R058 Other specified cough: Secondary | ICD-10-CM | POA: Diagnosis not present

## 2023-03-21 DIAGNOSIS — J9611 Chronic respiratory failure with hypoxia: Secondary | ICD-10-CM | POA: Diagnosis not present

## 2023-03-21 DIAGNOSIS — J441 Chronic obstructive pulmonary disease with (acute) exacerbation: Secondary | ICD-10-CM | POA: Diagnosis not present

## 2023-04-08 ENCOUNTER — Telehealth: Payer: HMO | Admitting: Thoracic Surgery (Cardiothoracic Vascular Surgery)

## 2023-04-08 ENCOUNTER — Ambulatory Visit
Admission: RE | Admit: 2023-04-08 | Discharge: 2023-04-08 | Disposition: A | Discharge status: Home / Self Care | Payer: HMO | Source: Ambulatory Visit | Attending: Thoracic Surgery (Cardiothoracic Vascular Surgery) | Admitting: Thoracic Surgery (Cardiothoracic Vascular Surgery)

## 2023-04-08 DIAGNOSIS — J439 Emphysema, unspecified: Secondary | ICD-10-CM | POA: Diagnosis not present

## 2023-04-08 DIAGNOSIS — I700 Atherosclerosis of aorta: Secondary | ICD-10-CM | POA: Diagnosis not present

## 2023-04-08 DIAGNOSIS — Z902 Acquired absence of lung [part of]: Secondary | ICD-10-CM

## 2023-04-08 DIAGNOSIS — R918 Other nonspecific abnormal finding of lung field: Secondary | ICD-10-CM | POA: Diagnosis not present

## 2023-04-08 DIAGNOSIS — K449 Diaphragmatic hernia without obstruction or gangrene: Secondary | ICD-10-CM | POA: Diagnosis not present

## 2023-04-08 NOTE — Progress Notes (Incomplete)
Lung nodule, 6-77mm, S/P lobectomy of lung

## 2023-04-15 ENCOUNTER — Ambulatory Visit: Payer: HMO | Admitting: Thoracic Surgery (Cardiothoracic Vascular Surgery)

## 2023-04-15 DIAGNOSIS — Z902 Acquired absence of lung [part of]: Secondary | ICD-10-CM | POA: Diagnosis not present

## 2023-04-15 NOTE — Progress Notes (Signed)
     [address]       [place] [address]             [phone]        Patient: Home Provider: Office Consent for Telemedicine visit obtained.   Today's visit was completed via a real-time telehealth (see specific modality noted below). The patient/authorized person provided oral consent at the time of the visit to engage in a telemedicine encounter with the present provider at [facility]. The patient/authorized person was informed of the potential benefits, limitations, and risks of telemedicine. The patient/authorized person expressed understanding that the laws that protect confidentiality also apply to telemedicine. The patient/authorized person acknowledged understanding that telemedicine does not provide emergency services and that he or she would need to call 911 or proceed to the nearest hospital for help if such a need arose.   Total time spent in the clinical discussion 10 minutes.  Telehealth Modality: Phone visit (audio only)  I had a telephone visit with Mrs. Hyman Hopes.  She is status post right upper lobectomy for T1 cN0 M0 stage I squamous cell cancer.  Original surgery was in September 2020.  She also has a history of a carotid subclavian bypass in 2019, coronary artery bypass grafting in 2018, carotid endarterectomy 18, and recent history of a TAVR in 2022.  She also has a hiatal hernia.   We reviewed the results of her cross-sectional imaging in regards to her lung cancer surveillance.  There were no new nodules.  She has gone back to smoking and understands importance of quitting.  She is going to try using the nicotine patch.  She will be referred for lung cancer screening.  Theone Bowell Keane Scrape

## 2023-05-02 DIAGNOSIS — J440 Chronic obstructive pulmonary disease with acute lower respiratory infection: Secondary | ICD-10-CM | POA: Diagnosis not present

## 2023-05-02 DIAGNOSIS — D692 Other nonthrombocytopenic purpura: Secondary | ICD-10-CM | POA: Diagnosis not present

## 2023-05-02 DIAGNOSIS — F419 Anxiety disorder, unspecified: Secondary | ICD-10-CM | POA: Diagnosis not present

## 2023-05-02 DIAGNOSIS — F17210 Nicotine dependence, cigarettes, uncomplicated: Secondary | ICD-10-CM | POA: Diagnosis not present

## 2023-05-02 DIAGNOSIS — I10 Essential (primary) hypertension: Secondary | ICD-10-CM | POA: Diagnosis not present

## 2023-05-02 DIAGNOSIS — Z299 Encounter for prophylactic measures, unspecified: Secondary | ICD-10-CM | POA: Diagnosis not present

## 2023-05-02 DIAGNOSIS — J439 Emphysema, unspecified: Secondary | ICD-10-CM | POA: Diagnosis not present

## 2023-05-02 DIAGNOSIS — I700 Atherosclerosis of aorta: Secondary | ICD-10-CM | POA: Diagnosis not present

## 2023-05-24 DIAGNOSIS — J449 Chronic obstructive pulmonary disease, unspecified: Secondary | ICD-10-CM | POA: Diagnosis not present

## 2023-06-23 DIAGNOSIS — J449 Chronic obstructive pulmonary disease, unspecified: Secondary | ICD-10-CM | POA: Diagnosis not present

## 2023-07-24 DIAGNOSIS — J449 Chronic obstructive pulmonary disease, unspecified: Secondary | ICD-10-CM | POA: Diagnosis not present

## 2023-07-26 ENCOUNTER — Telehealth: Payer: Self-pay | Admitting: Cardiology

## 2023-07-26 NOTE — Telephone Encounter (Signed)
 138 lbs right now   Patient says she was up to 148 lbs last week    [facility] confirms she picked up refill on 07/05/23 for torsemide  20 mg-take 2 tablets daily and has been consistent picking up the medication.Rx written by Dr.Vyas   She reports not urinating as much after taking torsemide  60 mg bid for the past few days    Has f/u on 08/24/23

## 2023-07-26 NOTE — Telephone Encounter (Signed)
 Pt c/o swelling: STAT is pt has developed SOB within 24 hours  How much weight have you gained and in what time span? She has lost 5lbs this week, but gained 10lbs   If swelling, where is the swelling located? Stomach,  Are you currently taking a fluid pill? Yes torsemide  (DEMADEX ) 20 MG tablet "been taking one in the morning and one at night and that help me lost the 5lbs"  Are you currently SOB? No Do you have a log of your daily weights (if so, list)?   Have you gained 3 pounds in a day or 5 pounds in a week? N/a  Have you traveled recently? No  Patient is concerned about the fluid and mentioned that her cough isn't as strong. Patient is concerned and would like to speak to a nurse. Please advise.

## 2023-07-27 NOTE — Telephone Encounter (Signed)
 MD message relayed and patient is ok with this plan,has f/u next month

## 2023-07-27 NOTE — Telephone Encounter (Signed)
 Somewhat confusing. So was up to 148 lbs last week and now down to 138 lbs today? At this weight she is noticing any swelling, SOB? Is she taking torsemide  40mg  bid or 60mg  bid?  J Camella Seim DM

## 2023-07-27 NOTE — Telephone Encounter (Signed)
 I just spoke with her. Her weight today is 136.2 lbs -now she says her weight yesterday was 137 lbs  Sunday her weight was 142 lbs, No SOB,No pedal edema, only her abdomen was swollen  She took Torsemide  40 mg bid for 3 days.  Today, she took 40 mg - she say her bottle says take 40 mg daily- I see it as 40 mg as needed(based on her weight)

## 2023-07-27 NOTE — Telephone Encounter (Signed)
 It sounds like her fluid issues has resolved with the higher dosing of the torsemide  she took, fine to go back to her prior regimen. If recurrent weight gain can take additional torsemide  like she did again to help resolve it  Letta Raw MD

## 2023-07-29 DIAGNOSIS — F419 Anxiety disorder, unspecified: Secondary | ICD-10-CM | POA: Diagnosis not present

## 2023-07-29 DIAGNOSIS — J9611 Chronic respiratory failure with hypoxia: Secondary | ICD-10-CM | POA: Diagnosis not present

## 2023-07-29 DIAGNOSIS — I4891 Unspecified atrial fibrillation: Secondary | ICD-10-CM | POA: Diagnosis not present

## 2023-07-29 DIAGNOSIS — I5032 Chronic diastolic (congestive) heart failure: Secondary | ICD-10-CM | POA: Diagnosis not present

## 2023-07-29 DIAGNOSIS — J449 Chronic obstructive pulmonary disease, unspecified: Secondary | ICD-10-CM | POA: Diagnosis not present

## 2023-08-08 ENCOUNTER — Emergency Department (HOSPITAL_COMMUNITY)

## 2023-08-08 ENCOUNTER — Inpatient Hospital Stay (HOSPITAL_COMMUNITY)
Admission: EM | Admit: 2023-08-08 | Discharge: 2023-08-10 | DRG: 193 | Disposition: A | Discharge status: Home / Self Care | Attending: Internal Medicine | Admitting: Internal Medicine

## 2023-08-08 ENCOUNTER — Encounter (HOSPITAL_COMMUNITY): Payer: Self-pay | Admitting: Emergency Medicine

## 2023-08-08 ENCOUNTER — Other Ambulatory Visit: Payer: Self-pay

## 2023-08-08 DIAGNOSIS — Z955 Presence of coronary angioplasty implant and graft: Secondary | ICD-10-CM | POA: Diagnosis not present

## 2023-08-08 DIAGNOSIS — R062 Wheezing: Secondary | ICD-10-CM | POA: Diagnosis not present

## 2023-08-08 DIAGNOSIS — E785 Hyperlipidemia, unspecified: Secondary | ICD-10-CM | POA: Diagnosis not present

## 2023-08-08 DIAGNOSIS — Z87891 Personal history of nicotine dependence: Secondary | ICD-10-CM

## 2023-08-08 DIAGNOSIS — J189 Pneumonia, unspecified organism: Principal | ICD-10-CM | POA: Diagnosis present

## 2023-08-08 DIAGNOSIS — F419 Anxiety disorder, unspecified: Secondary | ICD-10-CM | POA: Diagnosis present

## 2023-08-08 DIAGNOSIS — Z79899 Other long term (current) drug therapy: Secondary | ICD-10-CM

## 2023-08-08 DIAGNOSIS — J9622 Acute and chronic respiratory failure with hypercapnia: Secondary | ICD-10-CM | POA: Diagnosis not present

## 2023-08-08 DIAGNOSIS — I252 Old myocardial infarction: Secondary | ICD-10-CM | POA: Diagnosis not present

## 2023-08-08 DIAGNOSIS — Z85118 Personal history of other malignant neoplasm of bronchus and lung: Secondary | ICD-10-CM | POA: Diagnosis not present

## 2023-08-08 DIAGNOSIS — Z952 Presence of prosthetic heart valve: Secondary | ICD-10-CM

## 2023-08-08 DIAGNOSIS — J449 Chronic obstructive pulmonary disease, unspecified: Secondary | ICD-10-CM | POA: Diagnosis not present

## 2023-08-08 DIAGNOSIS — R0602 Shortness of breath: Secondary | ICD-10-CM | POA: Diagnosis not present

## 2023-08-08 DIAGNOSIS — J439 Emphysema, unspecified: Secondary | ICD-10-CM | POA: Diagnosis not present

## 2023-08-08 DIAGNOSIS — E8729 Other acidosis: Secondary | ICD-10-CM | POA: Diagnosis present

## 2023-08-08 DIAGNOSIS — I2510 Atherosclerotic heart disease of native coronary artery without angina pectoris: Secondary | ICD-10-CM | POA: Diagnosis not present

## 2023-08-08 DIAGNOSIS — J9620 Acute and chronic respiratory failure, unspecified whether with hypoxia or hypercapnia: Principal | ICD-10-CM | POA: Diagnosis present

## 2023-08-08 DIAGNOSIS — Z66 Do not resuscitate: Secondary | ICD-10-CM | POA: Diagnosis present

## 2023-08-08 DIAGNOSIS — I110 Hypertensive heart disease with heart failure: Secondary | ICD-10-CM | POA: Diagnosis not present

## 2023-08-08 DIAGNOSIS — J90 Pleural effusion, not elsewhere classified: Secondary | ICD-10-CM | POA: Diagnosis not present

## 2023-08-08 DIAGNOSIS — I739 Peripheral vascular disease, unspecified: Secondary | ICD-10-CM | POA: Diagnosis not present

## 2023-08-08 DIAGNOSIS — J441 Chronic obstructive pulmonary disease with (acute) exacerbation: Secondary | ICD-10-CM | POA: Diagnosis present

## 2023-08-08 DIAGNOSIS — R059 Cough, unspecified: Secondary | ICD-10-CM | POA: Diagnosis not present

## 2023-08-08 DIAGNOSIS — K219 Gastro-esophageal reflux disease without esophagitis: Secondary | ICD-10-CM | POA: Diagnosis not present

## 2023-08-08 DIAGNOSIS — I5043 Acute on chronic combined systolic (congestive) and diastolic (congestive) heart failure: Secondary | ICD-10-CM

## 2023-08-08 DIAGNOSIS — Z1152 Encounter for screening for COVID-19: Secondary | ICD-10-CM | POA: Diagnosis not present

## 2023-08-08 DIAGNOSIS — I5032 Chronic diastolic (congestive) heart failure: Secondary | ICD-10-CM | POA: Diagnosis present

## 2023-08-08 DIAGNOSIS — Z8249 Family history of ischemic heart disease and other diseases of the circulatory system: Secondary | ICD-10-CM | POA: Diagnosis not present

## 2023-08-08 DIAGNOSIS — Z8673 Personal history of transient ischemic attack (TIA), and cerebral infarction without residual deficits: Secondary | ICD-10-CM | POA: Diagnosis not present

## 2023-08-08 DIAGNOSIS — J440 Chronic obstructive pulmonary disease with acute lower respiratory infection: Secondary | ICD-10-CM | POA: Diagnosis present

## 2023-08-08 DIAGNOSIS — Z7982 Long term (current) use of aspirin: Secondary | ICD-10-CM | POA: Diagnosis not present

## 2023-08-08 DIAGNOSIS — F1320 Sedative, hypnotic or anxiolytic dependence, uncomplicated: Secondary | ICD-10-CM | POA: Diagnosis not present

## 2023-08-08 DIAGNOSIS — I350 Nonrheumatic aortic (valve) stenosis: Secondary | ICD-10-CM | POA: Diagnosis not present

## 2023-08-08 DIAGNOSIS — Z951 Presence of aortocoronary bypass graft: Secondary | ICD-10-CM | POA: Diagnosis not present

## 2023-08-08 DIAGNOSIS — Z833 Family history of diabetes mellitus: Secondary | ICD-10-CM

## 2023-08-08 DIAGNOSIS — Z09 Encounter for follow-up examination after completed treatment for conditions other than malignant neoplasm: Secondary | ICD-10-CM | POA: Diagnosis not present

## 2023-08-08 DIAGNOSIS — J9611 Chronic respiratory failure with hypoxia: Secondary | ICD-10-CM | POA: Diagnosis not present

## 2023-08-08 DIAGNOSIS — J168 Pneumonia due to other specified infectious organisms: Secondary | ICD-10-CM | POA: Diagnosis not present

## 2023-08-08 LAB — CBC WITH DIFFERENTIAL/PLATELET
Abs Immature Granulocytes: 0.03 10*3/uL (ref 0.00–0.07)
Basophils Absolute: 0.1 10*3/uL (ref 0.0–0.1)
Basophils Relative: 1 %
Eosinophils Absolute: 0 10*3/uL (ref 0.0–0.5)
Eosinophils Relative: 0 %
HCT: 45 % (ref 36.0–46.0)
Hemoglobin: 13.7 g/dL (ref 12.0–15.0)
Immature Granulocytes: 0 %
Lymphocytes Relative: 17 %
Lymphs Abs: 1.4 10*3/uL (ref 0.7–4.0)
MCH: 30 pg (ref 26.0–34.0)
MCHC: 30.4 g/dL (ref 30.0–36.0)
MCV: 98.5 fL (ref 80.0–100.0)
Monocytes Absolute: 0.7 10*3/uL (ref 0.1–1.0)
Monocytes Relative: 8 %
Neutro Abs: 5.8 10*3/uL (ref 1.7–7.7)
Neutrophils Relative %: 74 %
Platelets: 147 10*3/uL — ABNORMAL LOW (ref 150–400)
RBC: 4.57 MIL/uL (ref 3.87–5.11)
RDW: 14.4 % (ref 11.5–15.5)
WBC: 7.9 10*3/uL (ref 4.0–10.5)
nRBC: 0 % (ref 0.0–0.2)

## 2023-08-08 LAB — RESP PANEL BY RT-PCR (RSV, FLU A&B, COVID)  RVPGX2
Influenza A by PCR: NEGATIVE
Influenza B by PCR: NEGATIVE
Resp Syncytial Virus by PCR: NEGATIVE
SARS Coronavirus 2 by RT PCR: NEGATIVE

## 2023-08-08 LAB — MRSA NEXT GEN BY PCR, NASAL: MRSA by PCR Next Gen: DETECTED — AB

## 2023-08-08 LAB — BASIC METABOLIC PANEL WITH GFR
Anion gap: 8 (ref 5–15)
BUN: 9 mg/dL (ref 8–23)
CO2: 32 mmol/L (ref 22–32)
Calcium: 8.9 mg/dL (ref 8.9–10.3)
Chloride: 91 mmol/L — ABNORMAL LOW (ref 98–111)
Creatinine, Ser: 0.5 mg/dL (ref 0.44–1.00)
GFR, Estimated: >60 mL/min (ref >60)
Glucose, Bld: 97 mg/dL (ref 70–99)
Potassium: 4.4 mmol/L (ref 3.5–5.1)
Sodium: 131 mmol/L — ABNORMAL LOW (ref 135–145)

## 2023-08-08 LAB — MAGNESIUM: Magnesium: 2 mg/dL (ref 1.7–2.4)

## 2023-08-08 LAB — BLOOD GAS, VENOUS
Acid-Base Excess: 10.5 mmol/L — ABNORMAL HIGH (ref 0.0–2.0)
Acid-Base Excess: 8.5 mmol/L — ABNORMAL HIGH (ref 0.0–2.0)
Bicarbonate: 39.4 mmol/L — ABNORMAL HIGH (ref 20.0–28.0)
Bicarbonate: 37.8 mmol/L — ABNORMAL HIGH (ref 20.0–28.0)
Drawn by: 71599
Drawn by: 8648
O2 Saturation: 89.4 %
O2 Saturation: 97 %
Patient temperature: 37.1
Patient temperature: 36.7
pCO2, Ven: 73 mmHg (ref 44–60)
pCO2, Ven: 74 mmHg (ref 44–60)
pH, Ven: 7.34 (ref 7.25–7.43)
pH, Ven: 7.31 (ref 7.25–7.43)
pO2, Ven: 57 mmHg — ABNORMAL HIGH (ref 32–45)
pO2, Ven: 78 mmHg — ABNORMAL HIGH (ref 32–45)

## 2023-08-08 LAB — PHOSPHORUS: Phosphorus: 3.3 mg/dL (ref 2.5–4.6)

## 2023-08-08 LAB — BRAIN NATRIURETIC PEPTIDE: B Natriuretic Peptide: 548 pg/mL — ABNORMAL HIGH (ref 0.0–100.0)

## 2023-08-08 MED ORDER — ACETAMINOPHEN 650 MG RE SUPP: 650.0000 mg | Freq: Four times a day (QID) | RECTAL | Status: DC | PRN

## 2023-08-08 MED ORDER — ONDANSETRON HCL 4 MG/2ML IJ SOLN: 4.0000 mg | Freq: Four times a day (QID) | INTRAMUSCULAR | Status: DC | PRN

## 2023-08-08 MED ORDER — IPRATROPIUM-ALBUTEROL 0.5-2.5 (3) MG/3ML IN SOLN
6.0000 mL | Freq: Once | RESPIRATORY_TRACT | Status: AC
  Administered 2023-08-08: 6 mL via RESPIRATORY_TRACT
  Filled 2023-08-08: qty 6

## 2023-08-08 MED ORDER — SODIUM CHLORIDE 0.9 % IV SOLN
500.0000 mg | Freq: Once | INTRAVENOUS | Status: AC
  Administered 2023-08-08: 500 mg via INTRAVENOUS
  Filled 2023-08-08: qty 5

## 2023-08-08 MED ORDER — CHLORHEXIDINE GLUCONATE CLOTH 2 % EX PADS: 6.0000 | MEDICATED_PAD | Freq: Every day | CUTANEOUS | Status: DC

## 2023-08-08 MED ORDER — KETOROLAC TROMETHAMINE 30 MG/ML IJ SOLN
30.0000 mg | Freq: Three times a day (TID) | INTRAMUSCULAR | Status: DC | PRN
  Administered 2023-08-08 – 2023-08-09 (×3): 30 mg via INTRAVENOUS
  Filled 2023-08-08 (×3): qty 1

## 2023-08-08 MED ORDER — ARFORMOTEROL TARTRATE 15 MCG/2ML IN NEBU
15.0000 ug | INHALATION_SOLUTION | Freq: Two times a day (BID) | RESPIRATORY_TRACT | Status: DC
  Administered 2023-08-08 – 2023-08-10 (×4): 15 ug via RESPIRATORY_TRACT
  Filled 2023-08-08 (×4): qty 2

## 2023-08-08 MED ORDER — REVEFENACIN 175 MCG/3ML IN SOLN
175.0000 ug | Freq: Every day | RESPIRATORY_TRACT | Status: DC
  Administered 2023-08-09 – 2023-08-10 (×2): 175 ug via RESPIRATORY_TRACT
  Filled 2023-08-08 (×4): qty 3

## 2023-08-08 MED ORDER — ONDANSETRON HCL 4 MG PO TABS: 4.0000 mg | ORAL_TABLET | Freq: Four times a day (QID) | ORAL | Status: DC | PRN

## 2023-08-08 MED ORDER — ALBUTEROL SULFATE (2.5 MG/3ML) 0.083% IN NEBU
5.0000 mg | INHALATION_SOLUTION | Freq: Once | RESPIRATORY_TRACT | Status: AC
  Administered 2023-08-08: 5 mg via RESPIRATORY_TRACT
  Filled 2023-08-08: qty 6

## 2023-08-08 MED ORDER — TORSEMIDE 20 MG PO TABS
20.0000 mg | ORAL_TABLET | Freq: Every day | ORAL | Status: DC
  Administered 2023-08-08 – 2023-08-10 (×3): 20 mg via ORAL
  Filled 2023-08-08 (×3): qty 1

## 2023-08-08 MED ORDER — METOPROLOL SUCCINATE ER 25 MG PO TB24
12.5000 mg | ORAL_TABLET | Freq: Every day | ORAL | Status: DC
  Administered 2023-08-08 – 2023-08-10 (×3): 12.5 mg via ORAL
  Filled 2023-08-08 (×3): qty 1

## 2023-08-08 MED ORDER — DM-GUAIFENESIN ER 30-600 MG PO TB12
1.0000 | ORAL_TABLET | Freq: Two times a day (BID) | ORAL | Status: DC
  Administered 2023-08-08 – 2023-08-10 (×5): 1 via ORAL
  Filled 2023-08-08 (×5): qty 1

## 2023-08-08 MED ORDER — FUROSEMIDE 10 MG/ML IJ SOLN
40.0000 mg | INTRAMUSCULAR | Status: AC
  Administered 2023-08-08: 40 mg via INTRAVENOUS
  Filled 2023-08-08: qty 4

## 2023-08-08 MED ORDER — SODIUM CHLORIDE 0.9% FLUSH: 3.0000 mL | INTRAVENOUS | Status: DC | PRN

## 2023-08-08 MED ORDER — METHYLPREDNISOLONE SODIUM SUCC 40 MG IJ SOLR
40.0000 mg | Freq: Two times a day (BID) | INTRAMUSCULAR | Status: DC
  Administered 2023-08-08 – 2023-08-10 (×5): 40 mg via INTRAVENOUS
  Filled 2023-08-08 (×5): qty 1

## 2023-08-08 MED ORDER — ASPIRIN 81 MG PO TBEC
81.0000 mg | DELAYED_RELEASE_TABLET | Freq: Every day | ORAL | Status: DC
  Administered 2023-08-08 – 2023-08-10 (×3): 81 mg via ORAL
  Filled 2023-08-08 (×3): qty 1

## 2023-08-08 MED ORDER — POTASSIUM CHLORIDE CRYS ER 20 MEQ PO TBCR
20.0000 meq | EXTENDED_RELEASE_TABLET | Freq: Every morning | ORAL | Status: DC
  Administered 2023-08-09 – 2023-08-10 (×2): 20 meq via ORAL
  Filled 2023-08-08 (×2): qty 1

## 2023-08-08 MED ORDER — BUDESONIDE 0.5 MG/2ML IN SUSP
0.5000 mg | Freq: Two times a day (BID) | RESPIRATORY_TRACT | Status: DC
  Administered 2023-08-08 – 2023-08-10 (×4): 0.5 mg via RESPIRATORY_TRACT
  Filled 2023-08-08 (×4): qty 2

## 2023-08-08 MED ORDER — METHOCARBAMOL 500 MG PO TABS: 500.0000 mg | ORAL_TABLET | Freq: Three times a day (TID) | ORAL | Status: DC | PRN

## 2023-08-08 MED ORDER — SODIUM CHLORIDE 0.9 % IV SOLN
500.0000 mg | INTRAVENOUS | Status: DC
  Administered 2023-08-09 – 2023-08-10 (×2): 500 mg via INTRAVENOUS
  Filled 2023-08-08 (×2): qty 5

## 2023-08-08 MED ORDER — LORAZEPAM 2 MG/ML IJ SOLN
0.5000 mg | Freq: Once | INTRAMUSCULAR | Status: AC
  Administered 2023-08-08: 0.5 mg via INTRAVENOUS
  Filled 2023-08-08: qty 1

## 2023-08-08 MED ORDER — IPRATROPIUM-ALBUTEROL 0.5-2.5 (3) MG/3ML IN SOLN: 3.0000 mL | Freq: Four times a day (QID) | RESPIRATORY_TRACT | Status: DC | PRN

## 2023-08-08 MED ORDER — PANTOPRAZOLE SODIUM 40 MG PO TBEC
40.0000 mg | DELAYED_RELEASE_TABLET | Freq: Two times a day (BID) | ORAL | Status: DC
  Administered 2023-08-08 – 2023-08-10 (×4): 40 mg via ORAL
  Filled 2023-08-08 (×4): qty 1

## 2023-08-08 MED ORDER — ATORVASTATIN CALCIUM 40 MG PO TABS
40.0000 mg | ORAL_TABLET | Freq: Every day | ORAL | Status: DC
  Administered 2023-08-08 – 2023-08-10 (×3): 40 mg via ORAL
  Filled 2023-08-08 (×3): qty 1

## 2023-08-08 MED ORDER — ACETAMINOPHEN 325 MG PO TABS
650.0000 mg | ORAL_TABLET | Freq: Four times a day (QID) | ORAL | Status: DC | PRN
  Administered 2023-08-08 – 2023-08-10 (×6): 650 mg via ORAL
  Filled 2023-08-08 (×6): qty 2

## 2023-08-08 MED ORDER — ENOXAPARIN SODIUM 40 MG/0.4ML IJ SOSY
40.0000 mg | PREFILLED_SYRINGE | INTRAMUSCULAR | Status: DC
  Administered 2023-08-08 – 2023-08-09 (×2): 40 mg via SUBCUTANEOUS
  Filled 2023-08-08 (×2): qty 0.4

## 2023-08-08 MED ORDER — MUPIROCIN 2 % EX OINT
TOPICAL_OINTMENT | Freq: Two times a day (BID) | CUTANEOUS | Status: DC
  Filled 2023-08-08 (×3): qty 22

## 2023-08-08 MED ORDER — SODIUM CHLORIDE 0.9 % IV SOLN: 250.0000 mL | INTRAVENOUS | Status: AC | PRN

## 2023-08-08 MED ORDER — ALPRAZOLAM 0.25 MG PO TABS
0.2500 mg | ORAL_TABLET | Freq: Four times a day (QID) | ORAL | Status: DC | PRN
  Administered 2023-08-09 – 2023-08-10 (×3): 0.25 mg via ORAL
  Filled 2023-08-08 (×3): qty 1

## 2023-08-08 MED ORDER — SODIUM CHLORIDE 0.9% FLUSH
3.0000 mL | Freq: Two times a day (BID) | INTRAVENOUS | Status: DC
  Administered 2023-08-08 – 2023-08-10 (×4): 3 mL via INTRAVENOUS

## 2023-08-08 MED ORDER — SODIUM CHLORIDE 0.9 % IV SOLN
2.0000 g | Freq: Once | INTRAVENOUS | Status: AC
  Administered 2023-08-08: 2 g via INTRAVENOUS
  Filled 2023-08-08: qty 20

## 2023-08-08 MED ORDER — SODIUM CHLORIDE 0.9 % IV SOLN
1.0000 g | INTRAVENOUS | Status: DC
  Administered 2023-08-09 – 2023-08-10 (×2): 1 g via INTRAVENOUS
  Filled 2023-08-08 (×2): qty 10

## 2023-08-08 NOTE — H&P (Signed)
 History and Physical    [name] FMW:[id number] DOB: June 11, 1955 DOA: 08/08/2023 DOS: the patient was seen and examined on 08/08/2023 PCP: Rosamond Leta NOVAK, MD   Patient coming from: Home  Chief Complaint:  Chief Complaint  Patient presents with   Shortness of Breath   COPD   HPI: Emily Rocha is a 68 y.o. female with medical history significant of chronic respiratory failure, COPD/asthma, GERD, prior history of CVA, chronic diastolic heart failure and history of severe aortic stenosis status post TAVR; presented to the hospital secondary to increased shortness of breath, wheezing and productive coughing spells.  Symptom has been present for the last 4 days and worsening.  Patient reports homes bronchodilator management not helping. Patient is present chills, but denies chest pain, nausea, vomiting, dysuria, hematuria, melena, hematochezia or any other complaints.  Patient has expressed not wanting to be resuscitated or intubated if her respiratory status further decompensate.  In the ED chest x-ray demonstrating right lower lobe infiltrate with concern for community-acquired pneumonia; she was significantly tachypneic in the 30s respiratory rate; respiratory panel was negative for COVID, influenza and RSV; BNP 548.  Cultures taken, IV antibiotics started and the patient received steroids and nebulizer management.  Case discussed with pulmonologist who recommended heated high flow nasal cannula supplementation along with aggressive nebulizer management treatment.  At the moment she is stable to remain at [facility] for further management.  Review of Systems: As mentioned in the history of present illness. All other systems reviewed and are negative. Past Medical History:  Diagnosis Date   Anxiety    Asthma    COPD (chronic obstructive pulmonary disease) (HCC)    Coronary artery disease    a. s/p NSTEMI in 11/2016 and required CABG with LIMA-LAD, SVG-LCx and SVG-D1 and  complicated by cardiogenic shock b. cath in 05/2020 showing severe RCA stenosis and treated with orbital atherectomy and stent placement   GERD (gastroesophageal reflux disease)    History of CVA (cerebrovascular accident)    History of lung cancer 2020   Peripheral vascular disease (HCC)    S/P TAVR (transcatheter aortic valve replacement) 08/12/2020   s/p TAVR with a 23 mm Edwards S3U via the TF approach by Dr. Verlin & Dr. Lucas   Severe aortic stenosis    Past Surgical History:  Procedure Laterality Date   AORTIC ARCH ANGIOGRAPHY N/A 11/04/2017   Procedure: AORTIC ARCH ANGIOGRAPHY;  Surgeon: Harvey Carlin BRAVO, MD;  [facility];  Service: Cardiovascular;  Laterality: N/A;   APPLICATION OF WOUND VAC N/A 01/06/2018   Procedure: APPLICATION OF WOUND VAC;  Surgeon: Fleeta Hanford Coy, MD;  Location: [facility] OR;  Service: Thoracic;  Laterality: N/A;   APPLICATION OF WOUND VAC N/A 01/13/2018   Procedure: WOUND VAC CHANGE;  Surgeon: Fleeta Hanford Coy, MD;  Location: [facility] OR;  Service: Thoracic;  Laterality: N/A;   CARDIAC CATHETERIZATION     CAROTID-SUBCLAVIAN BYPASS GRAFT Right 12/26/2017   Procedure: AORTIC TO RIGHT COMMON CAROTID AND RIGHT SUBCLAVIAN  ARTERY  BYPASS;  Surgeon: Harvey Carlin BRAVO, MD;  Location: [facility] OR;  Service: Vascular;  Laterality: Right;   CHOLECYSTECTOMY     CLOSURE OF DIAPHRAGM  12/26/2017   Procedure: REPAIR OF DIAPHRAGM;  Surgeon: Fleeta Hanford Coy, MD;  Location: [facility] OR;  Service: Thoracic;;   COLONOSCOPY N/A 10/03/2014   Procedure: COLONOSCOPY;  Surgeon: Claudis RAYMOND Rivet, MD;  [facility];  Service: Endoscopy;  Laterality: N/A;  730  CORONARY ARTERY BYPASS GRAFT N/A 12/14/2016   Procedure: CORONARY ARTERY BYPASS GRAFTING (CABG) x three , using left internal mammary artery and right leg greater saphenous vein harvested endoscopically;  Surgeon: Fleeta Hanford Coy, MD;  Location: [facility] OR;  Service: Open Heart Surgery;  Laterality: N/A;   CORONARY  ATHERECTOMY N/A 06/11/2020   Procedure: CORONARY ATHERECTOMY;  Surgeon: Verlin Lonni BIRCH, MD;  [facility];  Service: Cardiovascular;  Laterality: N/A;   CORONARY IMAGING/OCT N/A 06/11/2020   Procedure: INTRAVASCULAR IMAGING/OCT;  Surgeon: Verlin Lonni BIRCH, MD;  [facility];  Service: Cardiovascular;  Laterality: N/A;   CORONARY PRESSURE/FFR STUDY N/A 06/04/2020   Procedure: INTRAVASCULAR PRESSURE WIRE/FFR STUDY;  Surgeon: Verlin Lonni BIRCH, MD;  [facility];  Service: Cardiovascular;  Laterality: N/A;   CORONARY STENT INTERVENTION N/A 06/11/2020   Procedure: CORONARY STENT INTERVENTION;  Surgeon: Verlin Lonni BIRCH, MD;  [facility];  Service: Cardiovascular;  Laterality: N/A;   ENDARTERECTOMY Right 12/06/2014   Procedure: ENDARTERECTOMY CAROTid;  Surgeon: Carlin FORBES Haddock, MD;  Location: [facility] OR;  Service: Vascular;  Laterality: Right;   ESOPHAGOGASTRODUODENOSCOPY N/A 12/03/2016   Procedure: ESOPHAGOGASTRODUODENOSCOPY (EGD);  Surgeon: Golda Claudis PENNER, MD;  [facility];  Service: Endoscopy;  Laterality: N/A;  7:30   LEFT HEART CATH AND CORONARY ANGIOGRAPHY N/A 12/14/2016   Procedure: LEFT HEART CATH AND CORONARY ANGIOGRAPHY;  Surgeon: Verlin Lonni BIRCH, MD;  [facility];  Service: Cardiovascular;  Laterality: N/A;   LOBECTOMY Right 10/26/2018   Procedure: RIGHT UPPER LOBECTOMY;  Surgeon: Shyrl Linnie KIDD, MD;  Location: [facility];  Service: Thoracic;  Laterality: Right;   PERIPHERAL VASCULAR CATHETERIZATION N/A 12/05/2014   Procedure:  Carotid Arch Angiography;  Surgeon: Redell LITTIE Door, MD;  [facility];  Service: Cardiovascular;  Laterality: N/A;   RIGHT/LEFT HEART CATH AND CORONARY/GRAFT ANGIOGRAPHY N/A 06/04/2020   Procedure: RIGHT/LEFT HEART CATH AND CORONARY/GRAFT ANGIOGRAPHY;  Surgeon: Verlin Lonni BIRCH, MD;  [facility];  Service:  Cardiovascular;  Laterality: N/A;   STERNAL WOUND DEBRIDEMENT N/A 01/06/2018   Procedure: STERNAL WOUND DEBRIDEMENT;  Surgeon: Fleeta Hanford Coy, MD;  Location: [facility] OR;  Service: Thoracic;  Laterality: N/A;   STERNOTOMY N/A 12/26/2017   Procedure: REDO STERNOTOMY;  Surgeon: Fleeta Hanford, Coy, MD;  Location: [facility] OR;  Service: Thoracic;  Laterality: N/A;   TEE WITHOUT CARDIOVERSION N/A 12/14/2016   Procedure: TRANSESOPHAGEAL ECHOCARDIOGRAM (TEE);  Surgeon: Fleeta Hanford, Coy, MD;  Location: [facility] OR;  Service: Open Heart Surgery;  Laterality: N/A;   TEE WITHOUT CARDIOVERSION N/A 08/12/2020   Procedure: TRANSESOPHAGEAL ECHOCARDIOGRAM (TEE);  Surgeon: Verlin Lonni BIRCH, MD;  Location: [facility] INVASIVE CV LAB;  Service: Open Heart Surgery;  Laterality: N/A;   TRANSCATHETER AORTIC VALVE REPLACEMENT, TRANSFEMORAL N/A 08/12/2020   Procedure: TRANSCATHETER AORTIC VALVE REPLACEMENT, TRANSFEMORAL;  Surgeon: Verlin Lonni BIRCH, MD;  [facility];  Service: Open Heart Surgery;  Laterality: N/A;   VIDEO ASSISTED THORACOSCOPY (VATS)/WEDGE RESECTION Right 10/26/2018   Procedure: VIDEO ASSISTED THORACOSCOPY (VATS)/WEDGE RESECTION;  Surgeon: Shyrl Linnie KIDD, MD;  Location[facility];  Service: Thoracic;  Laterality: Right;   VIDEO BRONCHOSCOPY N/A 10/26/2018   Procedure: VIDEO BRONCHOSCOPY;  Surgeon: Shyrl Linnie KIDD, MD;  Location: [facility] OR;  Service: Thoracic;  Laterality: N/A;   Social History:  reports that she quit smoking about 16 months ago. Her smoking use included cigarettes. She started smoking about 41 years  ago. She has never used smokeless tobacco. She reports that she does not drink alcohol  and does not use drugs.  No Known Allergies  Family History  Problem Relation Age of Onset   Heart attack Mother    Cancer Mother    Diabetes Mother    Heart disease Mother    Heart attack Father    Heart disease Father     Prior to Admission medications   Medication Sig Start Date End Date  Taking? Authorizing Provider  albuterol  (PROVENTIL ) (2.5 MG/3ML) 0.083% nebulizer solution Take 2.5 mg by nebulization every 4 (four) hours as needed for wheezing or shortness of breath. 03/30/19   [provider]  albuterol  (VENTOLIN  HFA) 108 (90 Base) MCG/ACT inhaler Inhale 1-2 puffs into the lungs every 6 (six) hours as needed for wheezing or shortness of breath.    [provider]  ALPRAZolam  (XANAX ) 0.5 MG tablet Take 0.5 mg by mouth 3 (three) times daily.    [provider]  Ascorbic Acid (VITAMIN C GUMMIE PO) Chew 500 mg by mouth daily.    [provider]  aspirin  EC 81 MG tablet Take 81 mg by mouth daily. Swallow whole.    [provider]  atorvastatin  (LIPITOR ) 40 MG tablet Take 1 tablet (40 mg total) daily by mouth. 12/27/16   Gold, Lemond BRAVO, PA-C  cholecalciferol  (VITAMIN D3) 25 MCG (1000 UNIT) tablet Take 1,000 Units by mouth in the morning.    [provider]  diphenhydramine-acetaminophen  (TYLENOL  PM) 25-500 MG TABS tablet Take 2 tablets by mouth at bedtime.    [provider]  magnesium  oxide (MAG-OX) 400 (240 Mg) MG tablet Take 400 mg by mouth daily.    [provider]  metoprolol  succinate (TOPROL -XL) 25 MG 24 hr tablet TAKE 0.5 TABLETS (12.5 MG TOTAL) BY MOUTH DAILY 11/24/22   Miriam Norris, NP  pantoprazole  (PROTONIX ) 40 MG tablet Take 40 mg by mouth 2 (two) times daily.    [provider]  potassium chloride  SA (KLOR-CON ) 20 MEQ tablet Take 20 mEq by mouth in the morning.    [provider]  torsemide  (DEMADEX ) 20 MG tablet Take 40 mg by mouth as needed (based on her weight).    [provider]  TRELEGY ELLIPTA 100-62.5-25 MCG/ACT AEPB Inhale 1 puff into the lungs daily. 01/04/22   [provider]    Physical Exam: Vitals:   08/08/23 0948 08/08/23 1015 08/08/23 1101 08/08/23 1317  BP:  137/77 (!) 153/60   Pulse:  96 97   Resp:  (!) 31 (!) 25   Temp:      TempSrc:       SpO2: 94% 93% 94% 94%  Weight:      Height:       General exam: Alert, awake, oriented x 3; reports no chest pain.  Difficulty speaking in full sentences and short winded with minimal exertion. Respiratory system: Positive rhonchi bilaterally; diffuse expiratory wheezing, mild use of accessory muscles with exertion and positive tachypnea on exam. Cardiovascular system: Rate controlled, no rubs, no gallops, no JVD on exam. Gastrointestinal system: Abdomen is nondistended, soft and nontender.  Bowel sounds. Central nervous system:No focal neurological deficits. Extremities: No cyanosis, clubbing or edema; patient reports pain on her right shoulder and upper back. Skin: No petechiae. Psychiatry: Judgement and insight appear normal.  Flat affect on exam.  Data Reviewed: BNP: 548 CBC: White blood cell 7.9, hemoglobin 13.7 and platelet count 147K Basic metabolic panel: Sodium 131, potassium  4.4, chloride 91, bicarb 32, BUN 9, creatinine 0.50 and GFR >60   Assessment and Plan: 1-acute on chronic respiratory failure/COPD exacerbation - Blood Bindubal scans demonstrated mild hypercapnia component - Continue providing heated high flow nasal cannula supplementation as recommended by pulmonologist - Will aim for saturation above 90% - Patient has been started on Pulmicort , Brovana , Yupelri  and as needed DuoNebs. - Will continue the use of flutter valve and mucolytic's -Continue IV antibiotics for treatment of underlying pneumonia.  2-CAP - Continue IV antibiotics - Follow culture results - Treatment for COPD exacerbation as mentioned above.  3-chronic diastolic heart failure - Compensated for the most part - Continue daily weights, low-sodium diet and strict I's and O's - Resume home diuretic therapy.  4-hypertension - Continue treatment with Toprol   5-GERD - Continue PPI  6-history of anxiety - Continue as needed alprazolam   7-hyperlipidemia - Continue statin  Advance Care  Planning:   Code Status: Limited: Do not attempt resuscitation (DNR) -DNR-LIMITED -Do Not Intubate/DNI    Consults: Pulmonology service curbside by EDP.  Family Communication: No family at bedside.  Severity of Illness: The appropriate patient status for this patient is INPATIENT. Inpatient status is judged to be reasonable and necessary in order to provide the required intensity of service to ensure the patient's safety. The patient's presenting symptoms, physical exam findings, and initial radiographic and laboratory data in the context of their chronic comorbidities is felt to place them at high risk for further clinical deterioration. Furthermore, it is not anticipated that the patient will be medically stable for discharge from the hospital within 2 midnights of admission.   * I certify that at the point of admission it is my clinical judgment that the patient will require inpatient hospital care spanning beyond 2 midnights from the point of admission due to high intensity of service, high risk for further deterioration and high frequency of surveillance required.*  Author: Eric Nunnery, MD 08/08/2023 2:29 PM  For on call review [email or web address].

## 2023-08-08 NOTE — Progress Notes (Signed)
 Bipap order now PRN, not needed at this time.  [facility] is at bedside if needed, but patient is now on 4L [place] with adequate sats.  Will continue to monitor.

## 2023-08-08 NOTE — ED Provider Notes (Signed)
 [place] EMERGENCY DEPARTMENT AT [facility] Provider Note   CSN: [id number] Arrival date & time: 08/08/23  9061     Patient presents with: Shortness of Breath and COPD   Emily Rocha is a 68 y.o. female.  {Add pertinent medical, surgical, social history, OB history to HPI:5868} 68 year old female with a history of COPD on 2.5 L as needed with a history of intubations, CAD status post PCI, lung cancer status post retroflexion, CHF, and aortic stenosis status post TAVR who presented to the emergency department with shortness of breath.  Over the past few days her air conditioner cut off and she reports that due to the humidity she thinks her COPD is flared.  Is coughing but is nonproductive.  No fevers or chills.  No chest pain or leg swelling.  Oxygen  sats have been dropping into the 60s at home.  Given steroids by EMS prior to arrival.       Prior to Admission medications   Medication Sig Start Date End Date Taking? Authorizing Provider  albuterol  (PROVENTIL ) (2.5 MG/3ML) 0.083% nebulizer solution Take 2.5 mg by nebulization every 4 (four) hours as needed for wheezing or shortness of breath. 03/30/19   [provider]  albuterol  (VENTOLIN  HFA) 108 (90 Base) MCG/ACT inhaler Inhale 1-2 puffs into the lungs every 6 (six) hours as needed for wheezing or shortness of breath.    [provider]  ALPRAZolam  (XANAX ) 0.5 MG tablet Take 0.5 mg by mouth 3 (three) times daily.    [provider]  Ascorbic Acid (VITAMIN C GUMMIE PO) Chew 500 mg by mouth daily.    [provider]  aspirin  EC 81 MG tablet Take 81 mg by mouth daily. Swallow whole.    [provider]  atorvastatin  (LIPITOR ) 40 MG tablet Take 1 tablet (40 mg total) daily by mouth. 12/27/16   Gold, Wayne E, PA-C  cholecalciferol  (VITAMIN D3) 25 MCG (1000 UNIT) tablet Take 1,000 Units by mouth in the morning.    [provider]  diphenhydramine-acetaminophen  (TYLENOL  PM)  25-500 MG TABS tablet Take 2 tablets by mouth at bedtime.    [provider]  magnesium  oxide (MAG-OX) 400 (240 Mg) MG tablet Take 400 mg by mouth daily.    [provider]  metoprolol  succinate (TOPROL -XL) 25 MG 24 hr tablet TAKE 0.5 TABLETS (12.5 MG TOTAL) BY MOUTH DAILY 11/24/22   Miriam Norris, NP  pantoprazole  (PROTONIX ) 40 MG tablet Take 40 mg by mouth 2 (two) times daily.    [provider]  potassium chloride  SA (KLOR-CON ) 20 MEQ tablet Take 20 mEq by mouth in the morning.    [provider]  torsemide  (DEMADEX ) 20 MG tablet Take 40 mg by mouth as needed (based on her weight).    [provider]  TRELEGY ELLIPTA 100-62.5-25 MCG/ACT AEPB Inhale 1 puff into the lungs daily. 01/04/22   [provider]    Allergies: Patient has no known allergies.    Review of Systems  Updated Vital Signs BP (!) 147/108 (BP Location: Right Arm) Comment: Simultaneous filing. User may not have seen previous data.  Pulse 100 Comment: Simultaneous filing. User may not have seen previous data.  Temp 98.8 F (37.1 C) (Oral)   Resp (!) 24 Comment: Simultaneous filing. User may not have seen previous data.  Ht 5' 4 (1.626 m)   Wt 62.1 kg   SpO2 93% Comment: Simultaneous filing. User may not have seen previous data.  BMI 23.52  kg/m   Physical Exam Vitals and nursing note reviewed.  Constitutional:      General: She is not in acute distress.    Appearance: She is well-developed.  HENT:     Head: Normocephalic and atraumatic.     Right Ear: External ear normal.     Left Ear: External ear normal.     Nose: Nose normal.   Eyes:     Extraocular Movements: Extraocular movements intact.     Conjunctiva/sclera: Conjunctivae normal.     Pupils: Pupils are equal, round, and reactive to light.    Cardiovascular:     Rate and Rhythm: Normal rate and regular rhythm.     Heart sounds: No murmur heard. Pulmonary:     Effort: Pulmonary effort is normal.  No respiratory distress.     Breath sounds: Wheezing present.     Comments: On 3 L nasal cannula  Musculoskeletal:     Cervical back: Normal range of motion and neck supple.     Right lower leg: No edema.     Left lower leg: No edema.   Skin:    General: Skin is warm and dry.   Neurological:     Mental Status: She is alert and oriented to person, place, and time. Mental status is at baseline.   Psychiatric:        Mood and Affect: Mood normal.     (all labs ordered are listed, but only abnormal results are displayed) Labs Reviewed  RESP PANEL BY RT-PCR (RSV, FLU A&B, COVID)  [id number]  BASIC METABOLIC PANEL WITH GFR  CBC WITH DIFFERENTIAL/PLATELET  BRAIN NATRIURETIC PEPTIDE    EKG: None  Radiology: No results found.  {Document cardiac monitor, telemetry assessment procedure when appropriate:32947} Procedures   Medications Ordered in the ED  ipratropium-albuterol  (DUONEB) 0.5-2.5 (3) MG/3ML nebulizer solution 6 mL (has no administration in time range)      {Click here for ABCD2, HEART and other calculators REFRESH Note before signing:1}                              Medical Decision Making Amount and/or Complexity of Data Reviewed Labs: ordered. Radiology: ordered.  Risk Prescription drug management.   ***  {Document critical care time when appropriate  Document review of labs and clinical decision tools ie CHADS2VASC2, etc  Document your independent review of radiology images and any outside records  Document your discussion with family members, caretakers and with consultants  Document social determinants of health affecting pt's care  Document your decision making why or why not admission, treatments were needed:32947:::1}   Final diagnoses:  None    ED Discharge Orders     None

## 2023-08-08 NOTE — ED Notes (Signed)
 Lab at bedside

## 2023-08-08 NOTE — ED Notes (Signed)
Respiratory at bedside placing BiPAP.

## 2023-08-08 NOTE — Progress Notes (Signed)
 Before treatment, patient sat on 5L was 99%.  Took patient down to 4L and sat was still 96%.  RN made aware, treatment was given.

## 2023-08-08 NOTE — ED Notes (Signed)
 Pt stating she is unable to tolerate Bi-pap wants mask removed, pt placed back on [place], RT and primary RN aware

## 2023-08-08 NOTE — Plan of Care (Signed)

## 2023-08-08 NOTE — ED Triage Notes (Signed)
 Pt arrives to triage via EMS. Per EMS, pt c/o shortness of breath for the last few days. Pt reports dry cough that started Saturday and her 02 stats have been dropping into the 60s.  Pt denies fevers at home.

## 2023-08-08 NOTE — Plan of Care (Signed)

## 2023-08-08 NOTE — Progress Notes (Signed)
 PCCM Brief Note:  Called by [facility] for transfer request. Spoke with ED provider. Recommend patient be trialed on heated high flow nasal canula, with high flow setting (40-50L) and 40% FiO2. High flow may help with work of breathing since she did not feel comfortable on bipap. Her ABG shows chronic retention and she is compensated. Recommend steroids, antibiotics and nebs for COPD exacerbation and diuresis per ground team for heart failure exacerbation.  Please call CCM back if patient is not responding to initial treatment with on going work of breathing concerning for intubation.  Dorn Chill, MD [facility] Office: [phone]   See Amion for personal pager PCCM on call pager [phone] until 7pm. Please call Elink 7p-7a. [phone]

## 2023-08-09 DIAGNOSIS — J441 Chronic obstructive pulmonary disease with (acute) exacerbation: Secondary | ICD-10-CM | POA: Diagnosis not present

## 2023-08-09 DIAGNOSIS — J9622 Acute and chronic respiratory failure with hypercapnia: Secondary | ICD-10-CM | POA: Diagnosis not present

## 2023-08-09 DIAGNOSIS — K219 Gastro-esophageal reflux disease without esophagitis: Secondary | ICD-10-CM | POA: Diagnosis not present

## 2023-08-09 DIAGNOSIS — J189 Pneumonia, unspecified organism: Secondary | ICD-10-CM | POA: Diagnosis not present

## 2023-08-09 LAB — CBC
HCT: 42.3 % (ref 36.0–46.0)
Hemoglobin: 12.9 g/dL (ref 12.0–15.0)
MCH: 29.7 pg (ref 26.0–34.0)
MCHC: 30.5 g/dL (ref 30.0–36.0)
MCV: 97.5 fL (ref 80.0–100.0)
Platelets: 160 10*3/uL (ref 150–400)
RBC: 4.34 MIL/uL (ref 3.87–5.11)
RDW: 14.3 % (ref 11.5–15.5)
WBC: 7.7 10*3/uL (ref 4.0–10.5)
nRBC: 0 % (ref 0.0–0.2)

## 2023-08-09 LAB — BASIC METABOLIC PANEL WITH GFR
Anion gap: 9 (ref 5–15)
BUN: 18 mg/dL (ref 8–23)
CO2: 33 mmol/L — ABNORMAL HIGH (ref 22–32)
Calcium: 8.7 mg/dL — ABNORMAL LOW (ref 8.9–10.3)
Chloride: 91 mmol/L — ABNORMAL LOW (ref 98–111)
Creatinine, Ser: 0.81 mg/dL (ref 0.44–1.00)
GFR, Estimated: >60 mL/min (ref >60)
Glucose, Bld: 124 mg/dL — ABNORMAL HIGH (ref 70–99)
Potassium: 4 mmol/L (ref 3.5–5.1)
Sodium: 133 mmol/L — ABNORMAL LOW (ref 135–145)

## 2023-08-09 LAB — HIV ANTIBODY (ROUTINE TESTING W REFLEX): HIV Screen 4th Generation wRfx: NONREACTIVE

## 2023-08-09 NOTE — Progress Notes (Signed)
 Mobility Specialist Progress Note:    08/09/23 0954  Mobility  Activity Stood at bedside;Transferred from bed to chair  Level of Assistance Contact guard assist, steadying assist  Assistive Device None  Distance Ambulated (ft) 4 ft  Range of Motion/Exercises Active;All extremities  Activity Response Tolerated well  Mobility Referral Yes  Mobility visit 1 Mobility  Mobility Specialist Start Time (ACUTE ONLY) [id number]  Mobility Specialist Stop Time (ACUTE ONLY) 0954  Mobility Specialist Time Calculation (min) (ACUTE ONLY) 17 min   Pt received in bed, agreeable to mobility. Required CGA to stand and ambulate with no AD. Tolerated well, asx throughout. Call bell in reach, all needs met.   Sherrilee Ditty Mobility Specialist Please contact via Special educational needs teacher or  Rehab office at [phone]

## 2023-08-09 NOTE — Progress Notes (Signed)
 Progress Note   [name] FMW:[id number] DOB: Jun 21, 1955 DOA: 08/08/2023     1 DOS: the patient was seen and examined on 08/09/2023   Brief hospital admission narrative: Emily Rocha is a 68 y.o. female with medical history significant of chronic respiratory failure, COPD/asthma, GERD, prior history of CVA, chronic diastolic heart failure and history of severe aortic stenosis status post TAVR; presented to the hospital secondary to increased shortness of breath, wheezing and productive coughing spells.  Symptom has been present for the last 4 days and worsening.  Patient reports homes bronchodilator management not helping. Patient is present chills, but denies chest pain, nausea, vomiting, dysuria, hematuria, melena, hematochezia or any other complaints.   Patient has expressed not wanting to be resuscitated or intubated if her respiratory status further decompensate.   In the ED chest x-ray demonstrating right lower lobe infiltrate with concern for community-acquired pneumonia; she was significantly tachypneic in the 30s respiratory rate; respiratory panel was negative for COVID, influenza and RSV; BNP 548.   Cultures taken, IV antibiotics started and the patient received steroids and nebulizer management.  Case discussed with pulmonologist who recommended heated high flow nasal cannula supplementation along with aggressive nebulizer management treatment.  At the moment she is stable to remain at [facility] for further management.  Assessment and plan 1-acute chronic respiratory failure/COPD exacerbation/community-acquired pneumonia -Oxygen  saturation in the mid 90s on 4 L supplementation - Patient reports feeling better and breathing easier - Continue bronchodilator management with the use of yupelri , Brovana , Pulmicort  and DuoNeb - Continue IV antibiotics and mucolytic's - Flutter valve has been encouraged - Follow clinical response.  2-chronic diastolic heart  failure - Stable and compensated - Continue to follow daily weights, strict I's and O's and low-sodium diet - Continue home diuretic therapy.  3-GERD - Continue PPI  4-hypertension - Continue Toprol   5-history of anxiety - Continue as needed alprazolam   6-hyperlipidemia - Continue statin  7-back pain - Appears to be muscular in nature - Continue as needed analgesics and as needed Robaxin.  Subjective:  Afebrile, breathing easier and no complaining of nausea or vomiting.  Patient denies chest pain.  Reported right-sided upper back pain discomfort.  Still short winded with activity.  4 L supplementation in place.  Physical Exam: Vitals:   08/09/23 0500 08/09/23 0712 08/09/23 0826 08/09/23 1227  BP:   (!) 120/58 112/77  Pulse:   94 84  Resp:   18 20  Temp:   97.8 F (36.6 C) 97.9 F (36.6 C)  TempSrc:   Oral Oral  SpO2:  94% 94% 95%  Weight: 62.1 kg     Height:       General exam: Alert, awake, oriented x 3; in no major distress. Respiratory system: Positive respiratory wheezing; bilateral rhonchi appreciated on exam. Cardiovascular system:RRR. No rubs or gallops. Gastrointestinal system: Abdomen is nondistended, soft and nontender. No organomegaly or masses felt. Normal bowel sounds heard. Central nervous system:  No focal neurological deficits. Extremities: No cyanosis or clubbing. Skin: No petechiae. Psychiatry: Judgement and insight appear normal. Mood & affect appropriate.    Data Reviewed: CBC: WBC 7.7, hemoglobin 12.9 and platelet count 160K Basic metabolic panel: Sodium 133, potassium 4.0, chloride 91, bicarb 33, BUN 18, creatinine 0.81 and GFR >60  Family Communication: No family at bedside.  Disposition: Status is: Inpatient Remains inpatient appropriate because: Continue IV antibiotics.  Anticipate discharge back home once medically stable (1-2 days).  Time spent: 50 minutes  Author: Eric Nunnery, MD 08/09/2023 6:18 PM  For on call review  [email or web address].

## 2023-08-09 NOTE — Progress Notes (Addendum)
 Nurse at bedside,patient alert and oriented times four this morning.Patient did c/o right neck and shoulder pain ,rated a 8,a dull discomfort,that was constant,patient was given Tylenol  650 mg's by mouth for pain per MAR prn.Patient was able to take her medications whole with no issues.Patient was assisted to and from bathroom, patient did have some shortness of breath while ambulating.Xanax  0.25 mg's given by mouth per patient's request for anxiety per MAR prn.  Plan of care on going.

## 2023-08-09 NOTE — TOC CM/SW Note (Signed)
 Transition of Care [facility]) - Inpatient Brief Assessment   Patient Details  Name: Emily Rocha MRN: [id number] Date of Birth: 01/30/1956  Transition of Care [facility]) CM/SW Contact:    Lucie Lunger, LCSWA Phone Number: 08/09/2023, 9:22 AM   Clinical Narrative: Transition of Care Department [facility]) has reviewed patient and no TOC needs have been identified at this time. We will continue to monitor patient advancement through interdiciplinary progression rounds. If new patient transition needs arise, please place a TOC consult.  Transition of Care Asessment: Insurance and Status: Insurance coverage has been reviewed Patient has primary care physician: Yes Home environment has been reviewed: From home Prior level of function:: Independent Prior/Current Home Services: No current home services Social Drivers of Health Review: SDOH reviewed no interventions necessary Readmission risk has been reviewed: Yes Transition of care needs: no transition of care needs at this time

## 2023-08-10 DIAGNOSIS — J441 Chronic obstructive pulmonary disease with (acute) exacerbation: Secondary | ICD-10-CM | POA: Diagnosis not present

## 2023-08-10 DIAGNOSIS — J9622 Acute and chronic respiratory failure with hypercapnia: Secondary | ICD-10-CM | POA: Diagnosis not present

## 2023-08-10 DIAGNOSIS — J189 Pneumonia, unspecified organism: Secondary | ICD-10-CM | POA: Diagnosis not present

## 2023-08-10 DIAGNOSIS — I5032 Chronic diastolic (congestive) heart failure: Secondary | ICD-10-CM | POA: Diagnosis not present

## 2023-08-10 MED ORDER — DM-GUAIFENESIN ER 30-600 MG PO TB12
1.0000 | ORAL_TABLET | Freq: Two times a day (BID) | ORAL | 0 refills | Status: AC
Start: 2023-08-10 — End: 2023-08-20

## 2023-08-10 MED ORDER — AMOXICILLIN-POT CLAVULANATE 875-125 MG PO TABS: 1.0000 | ORAL_TABLET | Freq: Two times a day (BID) | ORAL | 0 refills | Status: AC

## 2023-08-10 MED ORDER — PREDNISONE 20 MG PO TABS: ORAL_TABLET | ORAL | 0 refills | Status: DC

## 2023-08-10 NOTE — Discharge Summary (Signed)
 Physician Discharge Summary   [name] MRN: [id number] DOB: Mar 24, 1955  Admit date:     08/08/2023  Discharge date: 08/10/23  Discharge Physician: Eric Nunnery   PCP: Rosamond Leta NOVAK, MD   Recommendations at discharge:  Repeat chest x-ray in 6-8 weeks to assure resolution of infiltrate Repeat basic metabolic panel to follow ultralights renal function Reassess blood pressure and adjust antihypertensive as needed Make sure patient follow-up with pulmonologist and cardiologist as an outpatient.  Discharge Diagnoses: Principal Problem:   Acute on chronic respiratory failure (HCC) Active Problems:   COPD exacerbation (HCC)   Pneumonia of right lower lobe due to infectious organism   Brief hospital admission narrative: Emily Rocha is a 68 y.o. female with medical history significant of chronic respiratory failure, COPD/asthma, GERD, prior history of CVA, chronic diastolic heart failure and history of severe aortic stenosis status post TAVR; presented to the hospital secondary to increased shortness of breath, wheezing and productive coughing spells.  Symptom has been present for the last 4 days and worsening.  Patient reports homes bronchodilator management not helping. Patient is present chills, but denies chest pain, nausea, vomiting, dysuria, hematuria, melena, hematochezia or any other complaints.   Patient has expressed not wanting to be resuscitated or intubated if her respiratory status further decompensate.   In the ED chest x-ray demonstrating right lower lobe infiltrate with concern for community-acquired pneumonia; she was significantly tachypneic in the 30s respiratory rate; respiratory panel was negative for COVID, influenza and RSV; BNP 548.   Cultures taken, IV antibiotics started and the patient received steroids and nebulizer management.  Case discussed with pulmonologist who recommended heated high flow nasal cannula supplementation along with aggressive  nebulizer management treatment.  At the moment she is stable to remain at [facility] for further management.  Assessment and Plan: 1-acute chronic respiratory failure/COPD exacerbation/community-acquired pneumonia -Oxygen  saturation in the mid 90s on chronic 3 L supplementation satting at discharge. - Patient reports feeling better, breathing easier and feeling ready to go home. - Discharged home with steroids tapering and instruction to resume home bronchodilator management. - Continue antibiotics and mucolytic's as prescribed to complete management - Continue the use of flutter valve as instructed - Follow clinical response. -Outpatient follow-up with pulmonologist recommended.   2-chronic diastolic heart failure - Stable and compensated - Continue to follow daily weights, low-sodium diet and adequate hydration. - Continue home diuretic therapy.   3-GERD - Continue PPI   4-hypertension - Continue Toprol    5-history of anxiety - Continue as needed alprazolam    6-hyperlipidemia - Continue statin   7-back pain - Appears to be muscular in nature - Continue as needed analgesics and as needed Robaxin.  Consultants: At time of admission pulmonology service curbside by EDP Procedures performed: See below for x-ray reports. Disposition: Home Diet recommendation: Heart healthy/low-sodium diet.  DISCHARGE MEDICATION: Allergies as of 08/10/2023   No Known Allergies      Medication List     TAKE these medications    albuterol  108 (90 Base) MCG/ACT inhaler Commonly known as: VENTOLIN  HFA Inhale 1-2 puffs into the lungs every 6 (six) hours as needed for wheezing or shortness of breath.   albuterol  (2.5 MG/3ML) 0.083% nebulizer solution Commonly known as: PROVENTIL  Take 2.5 mg by nebulization every 4 (four) hours as needed for wheezing or shortness of breath.   ALPRAZolam  0.5 MG tablet Commonly known as: XANAX  Take 0.5 mg by mouth 3 (three) times daily.    amoxicillin -clavulanate  875-125 MG tablet Commonly known as: AUGMENTIN  Take 1 tablet by mouth 2 (two) times daily for 5 days.   aspirin  EC 81 MG tablet Take 81 mg by mouth daily. Swallow whole.   atorvastatin  40 MG tablet Commonly known as: Lipitor  Take 1 tablet (40 mg total) daily by mouth.   dextromethorphan-guaiFENesin  30-600 MG 12hr tablet Commonly known as: MUCINEX  DM Take 1 tablet by mouth 2 (two) times daily for 20 doses.   diphenhydramine-acetaminophen  25-500 MG Tabs tablet Commonly known as: TYLENOL  PM Take 2 tablets by mouth at bedtime.   EYE HEALTH AREDS 2 PO Take 1 capsule by mouth in the morning and at bedtime.   magnesium  oxide 400 (240 Mg) MG tablet Commonly known as: MAG-OX Take 400 mg by mouth daily.   metoprolol  succinate 25 MG 24 hr tablet Commonly known as: TOPROL -XL TAKE 0.5 TABLETS (12.5 MG TOTAL) BY MOUTH DAILY   pantoprazole  40 MG tablet Commonly known as: PROTONIX  Take 40 mg by mouth daily.   potassium chloride  SA 20 MEQ tablet Commonly known as: KLOR-CON  M Take 20 mEq by mouth in the morning.   predniSONE  20 MG tablet Commonly known as: DELTASONE  Take 3 tablets by mouth daily x 2 days; then 2 tablets by mouth daily x 2 days; then 1 tablet by mouth daily x 3 days; then half tablet by mouth daily x 3 days and stop prednisone .   torsemide  20 MG tablet Commonly known as: DEMADEX  Take 40 mg by mouth as needed (based on her weight).   Trelegy Ellipta 100-62.5-25 MCG/ACT Aepb Generic drug: Fluticasone-Umeclidin-Vilant Inhale 1 puff into the lungs daily.   VITAMIN C GUMMIE PO Take 400 mg by mouth daily.        Follow-up Information     Vyas, Dhruv B, MD. Schedule an appointment as soon as possible for a visit in 10 day(s).   Specialty: Internal Medicine Contact information: [address] [place] [address] [phone]                Discharge Exam: Filed Weights   08/08/23 0944 08/09/23 0500 08/10/23 0557  Weight: 62.1 kg  62.1 kg 63.9 kg   General exam: Alert, awake, oriented x 3; speaking in full sentences, feeling better and expressing being ready to go home. Respiratory system: Good saturation on chronic supplementation; improved from 1 bilaterally.  Positive rhonchi and mild expiratory wheezing appreciated on exam.  No using accessory muscles. Cardiovascular system: Rate controlled, no rubs, no gallops, no JVD. Gastrointestinal system: Abdomen is nondistended, soft and nontender. No organomegaly or masses felt. Normal bowel sounds heard. Central nervous system:  No focal neurological deficits. Extremities: No cyanosis or clubbing. Skin: No petechiae. Psychiatry: Judgement and insight appear normal. Mood & affect appropriate.    Condition at discharge: Stable and improved.  The results of significant diagnostics from this hospitalization (including imaging, microbiology, ancillary and laboratory) are listed below for reference.   Imaging Studies: DG Chest Port 1 View Result Date: 08/08/2023 CLINICAL DATA:  Shortness of breath and COPD EXAM: PORTABLE CHEST 1 VIEW COMPARISON:  04/01/2022 FINDINGS: Apical lordotic positioning. Median sternotomy. TAVR. Midline trachea. Cardiomegaly accentuated by AP portable technique. New or increased small right pleural effusion. No pneumothorax. Interstitial prominence is moderate to marked, increased. Right hemidiaphragm elevation with right base airspace disease. IMPRESSION: Right base airspace disease with adjacent small right pleural effusion, favoring pneumonia. Cardiomegaly with moderate to marked progressive interstitial thickening. partially favored to be due to smoking/chronic bronchitis. Suspect superimposed  interstitial edema. Electronically Signed   By: Rockey Kilts M.D.   On: 08/08/2023 11:14    Microbiology: Results for orders placed or performed during the hospital encounter of 08/08/23  Resp panel by RT-PCR (RSV, Flu A&B, Covid) Anterior Nasal Swab     Status:  None   Collection Time: 08/08/23  9:45 AM   Specimen: Anterior Nasal Swab  Result Value Ref Range Status   SARS Coronavirus 2 by RT PCR NEGATIVE NEGATIVE Final    Comment: (NOTE) SARS-CoV-2 target nucleic acids are NOT DETECTED.  The SARS-CoV-2 RNA is generally detectable in upper respiratory specimens during the acute phase of infection. The lowest concentration of SARS-CoV-2 viral copies this assay can detect is 138 copies/mL. A negative result does not preclude SARS-Cov-2 infection and should not be used as the sole basis for treatment or other patient management decisions. A negative result may occur with  improper specimen collection/handling, submission of specimen other than nasopharyngeal swab, presence of viral mutation(s) within the areas targeted by this assay, and inadequate number of viral copies(<138 copies/mL). A negative result must be combined with clinical observations, patient history, and epidemiological information. The expected result is Negative.  Fact Sheet for Patients:  BloggerCourse.com  Fact Sheet for Healthcare Providers:  SeriousBroker.it  This test is no t yet approved or cleared by the [place]  FDA and  has been authorized for detection and/or diagnosis of SARS-CoV-2 by FDA under an Emergency Use Authorization (EUA). This EUA will remain  in effect (meaning this test can be used) for the duration of the COVID-19 declaration under Section 564(b)(1) of the Act, 21 U.S.C.section 360bbb-3(b)(1), unless the authorization is terminated  or revoked sooner.       Influenza A by PCR NEGATIVE NEGATIVE Final   Influenza B by PCR NEGATIVE NEGATIVE Final    Comment: (NOTE) The Xpert Xpress SARS-CoV-2/FLU/RSV plus assay is intended as an aid in the diagnosis of influenza from Nasopharyngeal swab specimens and should not be used as a sole basis for treatment. Nasal washings and aspirates are unacceptable  for Xpert Xpress SARS-CoV-2/FLU/RSV testing.  Fact Sheet for Patients: BloggerCourse.com  Fact Sheet for Healthcare Providers: SeriousBroker.it  This test is not yet approved or cleared by the [place]  FDA and has been authorized for detection and/or diagnosis of SARS-CoV-2 by FDA under an Emergency Use Authorization (EUA). This EUA will remain in effect (meaning this test can be used) for the duration of the COVID-19 declaration under Section 564(b)(1) of the Act, 21 U.S.C. section 360bbb-3(b)(1), unless the authorization is terminated or revoked.     Resp Syncytial Virus by PCR NEGATIVE NEGATIVE Final    Comment: (NOTE) Fact Sheet for Patients: BloggerCourse.com  Fact Sheet for Healthcare Providers: SeriousBroker.it  This test is not yet approved or cleared by the [place]  FDA and has been authorized for detection and/or diagnosis of SARS-CoV-2 by FDA under an Emergency Use Authorization (EUA). This EUA will remain in effect (meaning this test can be used) for the duration of the COVID-19 declaration under Section 564(b)(1) of the Act, 21 U.S.C. section 360bbb-3(b)(1), unless the authorization is terminated or revoked.  Performed at [facility], [address] [place], [place] [address]   Blood culture (routine x 2)     Status: None (Preliminary result)   Collection Time: 08/08/23 11:59 AM   Specimen: BLOOD  Result Value Ref Range Status   Specimen Description BLOOD BLOOD LEFT ARM  Final   Special Requests   Final  BOTTLES DRAWN AEROBIC AND ANAEROBIC Blood Culture adequate volume   Culture   Final    NO GROWTH 2 DAYS Performed at [facility], [address], [place], [place] [address]    Report Status PENDING  Incomplete  Blood culture (routine x 2)     Status: None (Preliminary result)   Collection Time: 08/08/23 12:00 PM   Specimen: BLOOD  Result Value  Ref Range Status   Specimen Description BLOOD BLOOD RIGHT ARM  Final   Special Requests   Final    BOTTLES DRAWN AEROBIC AND ANAEROBIC Blood Culture adequate volume   Culture   Final    NO GROWTH 2 DAYS Performed at [facility], [address] [place], [place] [address]    Report Status PENDING  Incomplete  MRSA Next Gen by PCR, Nasal     Status: Abnormal   Collection Time: 08/08/23  4:42 PM   Specimen: Nasal Mucosa; Nasal Swab  Result Value Ref Range Status   MRSA by PCR Next Gen DETECTED (A) NOT DETECTED Final    Comment: RESULT CALLED TO, READ BACK BY AND VERIFIED WITH: K ROGERS AT 1817 ON [id number] BY S DALTON (NOTE) The GeneXpert MRSA Assay (FDA approved for NASAL specimens only), is one component of a comprehensive MRSA colonization surveillance program. It is not intended to diagnose MRSA infection nor to guide or monitor treatment for MRSA infections. Test performance is not FDA approved in patients less than 8 years old. Performed at [facility], [address], [place], [place] [address]     Labs: CBC: Recent Labs  Lab 08/08/23 0953 08/09/23 0405  WBC 7.9 7.7  NEUTROABS 5.8  --   HGB 13.7 12.9  HCT 45.0 42.3  MCV 98.5 97.5  PLT 147* 160   Basic Metabolic Panel: Recent Labs  Lab 08/08/23 0943 08/08/23 0953 08/09/23 0405  NA  --  131* 133*  K  --  4.4 4.0  CL  --  91* 91*  CO2  --  32 33*  GLUCOSE  --  97 124*  BUN  --  9 18  CREATININE  --  0.50 0.81  CALCIUM   --  8.9 8.7*  MG 2.0  --   --   PHOS 3.3  --   --    Discharge time spent: greater than 30 minutes.  Signed: Eric Nunnery, MD [facility] 08/10/2023

## 2023-08-10 NOTE — Progress Notes (Signed)
 Mobility Specialist Progress Note:    08/10/23 1431  Mobility  Activity Transferred from chair to bed  Level of Assistance Contact guard assist, steadying assist  Assistive Device None  Distance Ambulated (ft) 5 ft  Range of Motion/Exercises Active;All extremities  Activity Response Tolerated well  Mobility Referral Yes  Mobility visit 1 Mobility  Mobility Specialist Start Time (ACUTE ONLY) 1400  Mobility Specialist Stop Time (ACUTE ONLY) 1414  Mobility Specialist Time Calculation (min) (ACUTE ONLY) 14 min   Pt received requesting assistance to bed. Required CGA to stand and ambulate with no AD. Tolerated well, asx throughout. Left supine, all needs met.   Sherrilee Ditty Mobility Specialist Please contact via Special educational needs teacher or  Rehab office at [phone]

## 2023-08-10 NOTE — Care Management Important Message (Signed)
 Important Message  Patient Details  Name: Emily Rocha MRN: [id number] Date of Birth: 11-23-1955   Important Message Given:  N/A - LOS <3 / Initial given by admissions     Ariyanna Oien L Margarita Bobrowski 08/10/2023, 3:32 PM

## 2023-08-10 NOTE — Plan of Care (Signed)
 ?  Problem: Clinical Measurements: ?Goal: Ability to maintain clinical measurements within normal limits will improve ?Outcome: Progressing ?Goal: Will remain free from infection ?Outcome: Progressing ?Goal: Diagnostic test results will improve ?Outcome: Progressing ?  ?

## 2023-08-10 NOTE — Progress Notes (Signed)
 Nurse at bedside,was called to room,patient's IV was leaking.New IV started 22 gauge to right lateral forearm times one attempt,patient tolerated procedure.Xanax  0.25 mg's given by mouth per patient's request for anxiety.Plan of care on going.

## 2023-08-10 NOTE — Progress Notes (Signed)
 Mobility Specialist Progress Note:    08/10/23 1102  Mobility  Activity Ambulated with assistance in room;Transferred from bed to chair  Level of Assistance Contact guard assist, steadying assist  Assistive Device None  Distance Ambulated (ft) 6 ft  Range of Motion/Exercises Active;All extremities  Activity Response Tolerated well  Mobility Referral Yes  Mobility visit 1 Mobility  Mobility Specialist Start Time (ACUTE ONLY) 1033  Mobility Specialist Stop Time (ACUTE ONLY) 1048  Mobility Specialist Time Calculation (min) (ACUTE ONLY) 15 min   Pt received in bed, agreeable to mobility. Required CGA to stand and transfer with no AD. Tolerated well, pts IV bleeding upon arriving in room. Notified LPN [place], all needs met.   Sherrilee Ditty Mobility Specialist Please contact via Special educational needs teacher or  Rehab office at [phone]

## 2023-08-13 LAB — CULTURE, BLOOD (ROUTINE X 2)
Culture: NO GROWTH
Culture: NO GROWTH
Special Requests: ADEQUATE
Special Requests: ADEQUATE

## 2023-08-23 DIAGNOSIS — J449 Chronic obstructive pulmonary disease, unspecified: Secondary | ICD-10-CM | POA: Diagnosis not present

## 2023-08-24 ENCOUNTER — Ambulatory Visit: Attending: Cardiology | Admitting: Cardiology

## 2023-08-24 ENCOUNTER — Encounter: Payer: Self-pay | Admitting: Cardiology

## 2023-08-24 VITALS — BP 100/58 | HR 91 | Resp 16 | Ht 64.0 in | Wt 133.2 lb

## 2023-08-24 DIAGNOSIS — I2510 Atherosclerotic heart disease of native coronary artery without angina pectoris: Secondary | ICD-10-CM

## 2023-08-24 DIAGNOSIS — I6523 Occlusion and stenosis of bilateral carotid arteries: Secondary | ICD-10-CM | POA: Diagnosis not present

## 2023-08-24 DIAGNOSIS — Z952 Presence of prosthetic heart valve: Secondary | ICD-10-CM

## 2023-08-24 DIAGNOSIS — E782 Mixed hyperlipidemia: Secondary | ICD-10-CM

## 2023-08-24 NOTE — Progress Notes (Signed)
 Clinical Summary Emily Rocha is a 68 y.o.female seen today for follow up of the following medical problems.    1. CAD - s/p CABG (LIMA-LAD, SVG-LCX, SVG-Diag) after presenting 11/2016 with NSTEMI. Presentation complicated by cardiogenic shock - echo 12/2016 LVEF 45-50% - developed wound infection followed by CT surgery that has now healed.       11/2017 echo LVEF normal LVEF, grade I DDX, mild AS, mild AI,   10/2018 nuclear stress no ischemia     05/2020 cath as reported below. Received PCI to prox to mid RCA - no chest pain. Chronic SOB/DOE, pneumonia just few weeks ago.   - compliant with meds     2. Carotid stenosis - history of prior symptomatic carotid stenosis with TIAs.  - history of prior right CEA - found to have high grade stenosis right inonominate artery, she is s/p right subclavian and right common carotid bypass 12/2017 - followed by vascular   2022 Carotid US  mild bilateral disease.  - would repeat US  in 2024     3. Chronic diastolic HF - recent weight gain 06/2021, took extra torsemide  - had been prednisone .  - swelling has resolved with extra torsemide .  - home weight 150 lbs.    - no recent edema, home weight around 140 lbs and stable. Down 10 lbs since our last visit, more active and has made some dietary changes   - has torsemide  40mg  prn,no recent edema.      4. Aortic stenosis -s/p TAVR 07/2020 - 06/2021 echo LVEF 65%, no WMAs, grade II dd, normal TAVR valve -03/2022 LVEF 55-60%, normal TAVR     5. COPD - followed by pulmonary -reports marked improvement in symptoms since starting trelegy.      6. Lung cancer -s/p lung wedge resection       7. Syncope She was again admitted to [facility] from 11/12/2019 - 11/13/2019 for evaluation of multiple syncopal episodes and she was found on the ground by EMS. Imaging during admission did show a subdural hematoma and Neurosurgery recommended discontinuing Eliquis  and nonoperative management. A repeat  echocardiogram was performed and showed a preserved EF of 60 to 65%. This was read as showing moderate to severe aortic stenosis and Dr. Alvan felt it was more consistent with a moderate range and likely did not play a role in her syncopal episodes. She did have soft blood pressures during admission and was recommended to remain off Toprol -XL and Imdur  until outpatient follow-up. Was also recommended to have an outpatient monitor so to assess for any significant arrhythmias.   - overall benign event monitor - no recurrent  episodes   8. Hyperlipdiemia - 05/2021 TC 113 TG 139 HDL 48 LDL 37  - upcoming labs with pcp 9. Afib - prior admission with afib with RVR in setting of pneumonia/sepsis - no recent palpitatons.  - not on anticoag due to isolated occurrence, also had prior SDH when on eliquis  for PE in the past   10. History of PE - had been on eliquis , stopped after prior fall with SDH Past Medical History:  Diagnosis Date   Anxiety    Asthma    COPD (chronic obstructive pulmonary disease) (HCC)    Coronary artery disease    a. s/p NSTEMI in 11/2016 and required CABG with LIMA-LAD, SVG-LCx and SVG-D1 and complicated by cardiogenic shock b. cath in 05/2020 showing severe RCA stenosis and treated with orbital atherectomy and stent placement   GERD (  gastroesophageal reflux disease)    History of CVA (cerebrovascular accident)    History of lung cancer 2020   Peripheral vascular disease (HCC)    S/P TAVR (transcatheter aortic valve replacement) 08/12/2020   s/p TAVR with a 23 mm Edwards S3U via the TF approach by Dr. Verlin & Dr. Lucas   Severe aortic stenosis      No Known Allergies   Current Outpatient Medications  Medication Sig Dispense Refill   albuterol  (PROVENTIL ) (2.5 MG/3ML) 0.083% nebulizer solution Take 2.5 mg by nebulization every 4 (four) hours as needed for wheezing or shortness of breath.     albuterol  (VENTOLIN  HFA) 108 (90 Base) MCG/ACT inhaler Inhale 1-2  puffs into the lungs every 6 (six) hours as needed for wheezing or shortness of breath.     ALPRAZolam  (XANAX ) 0.5 MG tablet Take 0.5 mg by mouth 3 (three) times daily.     Ascorbic Acid (VITAMIN C GUMMIE PO) Take 400 mg by mouth daily.     aspirin  EC 81 MG tablet Take 81 mg by mouth daily. Swallow whole.     atorvastatin  (LIPITOR ) 40 MG tablet Take 1 tablet (40 mg total) daily by mouth. 30 tablet 1   diphenhydramine-acetaminophen  (TYLENOL  PM) 25-500 MG TABS tablet Take 2 tablets by mouth at bedtime.     magnesium  oxide (MAG-OX) 400 (240 Mg) MG tablet Take 400 mg by mouth daily.     metoprolol  succinate (TOPROL -XL) 25 MG 24 hr tablet TAKE 0.5 TABLETS (12.5 MG TOTAL) BY MOUTH DAILY 45 tablet 3   Multiple Vitamins-Minerals (EYE HEALTH AREDS 2 PO) Take 1 capsule by mouth in the morning and at bedtime.     pantoprazole  (PROTONIX ) 40 MG tablet Take 40 mg by mouth daily.     potassium chloride  SA (KLOR-CON ) 20 MEQ tablet Take 20 mEq by mouth in the morning.     predniSONE  (DELTASONE ) 20 MG tablet Take 3 tablets by mouth daily x 2 days; then 2 tablets by mouth daily x 2 days; then 1 tablet by mouth daily x 3 days; then half tablet by mouth daily x 3 days and stop prednisone . 15 tablet 0   torsemide  (DEMADEX ) 20 MG tablet Take 40 mg by mouth as needed (based on her weight).     TRELEGY ELLIPTA 100-62.5-25 MCG/ACT AEPB Inhale 1 puff into the lungs daily.     No current facility-administered medications for this visit.     Past Surgical History:  Procedure Laterality Date   AORTIC ARCH ANGIOGRAPHY N/A 11/04/2017   Procedure: AORTIC ARCH ANGIOGRAPHY;  Surgeon: Harvey Carlin BRAVO, MD;  Location: [facility];  Service: Cardiovascular;  Laterality: N/A;   APPLICATION OF WOUND VAC N/A 01/06/2018   Procedure: APPLICATION OF WOUND VAC;  Surgeon: Fleeta Hanford Coy, MD;  Location: [facility] OR;  Service: Thoracic;  Laterality: N/A;   APPLICATION OF WOUND VAC N/A 01/13/2018   Procedure: WOUND VAC CHANGE;   Surgeon: Fleeta Hanford Coy, MD;  Location: [facility] OR;  Service: Thoracic;  Laterality: N/A;   CARDIAC CATHETERIZATION     CAROTID-SUBCLAVIAN BYPASS GRAFT Right 12/26/2017   Procedure: AORTIC TO RIGHT COMMON CAROTID AND RIGHT SUBCLAVIAN  ARTERY  BYPASS;  Surgeon: Harvey Carlin BRAVO, MD;  Location: [facility] OR;  Service: Vascular;  Laterality: Right;   CHOLECYSTECTOMY     CLOSURE OF DIAPHRAGM  12/26/2017   Procedure: REPAIR OF DIAPHRAGM;  Surgeon: Fleeta Hanford Coy, MD;  Location: [facility] OR;  Service: Thoracic;;   COLONOSCOPY N/A 10/03/2014  Procedure: COLONOSCOPY;  Surgeon: Claudis RAYMOND Rivet, MD;  [facility]: [facility];  Service: Endoscopy;  Laterality: N/A;  730   CORONARY ARTERY BYPASS GRAFT N/A 12/14/2016   Procedure: CORONARY ARTERY BYPASS GRAFTING (CABG) x three , using left internal mammary artery and right leg greater saphenous vein harvested endoscopically;  Surgeon: Fleeta Hanford Coy, MD;  Location: [facility] OR;  Service: Open Heart Surgery;  Laterality: N/A;   CORONARY ATHERECTOMY N/A 06/11/2020   Procedure: CORONARY ATHERECTOMY;  Surgeon: Verlin Lonni BIRCH, MD;  Location: [facility];  Service: Cardiovascular;  Laterality: N/A;   CORONARY IMAGING/OCT N/A 06/11/2020   Procedure: INTRAVASCULAR IMAGING/OCT;  Surgeon: Verlin Lonni BIRCH, MD;  Location: [facility];  Service: Cardiovascular;  Laterality: N/A;   CORONARY PRESSURE/FFR STUDY N/A 06/04/2020   Procedure: INTRAVASCULAR PRESSURE WIRE/FFR STUDY;  Surgeon: Verlin Lonni BIRCH, MD;  Location: [facility];  Service: Cardiovascular;  Laterality: N/A;   CORONARY STENT INTERVENTION N/A 06/11/2020   Procedure: CORONARY STENT INTERVENTION;  Surgeon: Verlin Lonni BIRCH, MD;  [facility]: [facility];  Service: Cardiovascular;  Laterality: N/A;   ENDARTERECTOMY Right 12/06/2014   Procedure: ENDARTERECTOMY CAROTid;  Surgeon: Carlin FORBES Haddock, MD;  Location: [facility] OR;  Service: Vascular;  Laterality: Right;    ESOPHAGOGASTRODUODENOSCOPY N/A 12/03/2016   Procedure: ESOPHAGOGASTRODUODENOSCOPY (EGD);  Surgeon: Rivet Claudis RAYMOND, MD;  [facility];  Service: Endoscopy;  Laterality: N/A;  7:30   LEFT HEART CATH AND CORONARY ANGIOGRAPHY N/A 12/14/2016   Procedure: LEFT HEART CATH AND CORONARY ANGIOGRAPHY;  Surgeon: Verlin Lonni BIRCH, MD;  [facility]: [facility];  Service: Cardiovascular;  Laterality: N/A;   LOBECTOMY Right 10/26/2018   Procedure: RIGHT UPPER LOBECTOMY;  Surgeon: Shyrl Linnie KIDD, MD;  Location: [facility] OR;  Service: Thoracic;  Laterality: Right;   PERIPHERAL VASCULAR CATHETERIZATION N/A 12/05/2014   Procedure:  Carotid Arch Angiography;  Surgeon: Redell LITTIE Door, MD;  Location: [facility];  Service: Cardiovascular;  Laterality: N/A;   RIGHT/LEFT HEART CATH AND CORONARY/GRAFT ANGIOGRAPHY N/A 06/04/2020   Procedure: RIGHT/LEFT HEART CATH AND CORONARY/GRAFT ANGIOGRAPHY;  Surgeon: Verlin Lonni BIRCH, MD;  [facility]: [facility];  Service: Cardiovascular;  Laterality: N/A;   STERNAL WOUND DEBRIDEMENT N/A 01/06/2018   Procedure: STERNAL WOUND DEBRIDEMENT;  Surgeon: Fleeta Hanford Coy, MD;  Location: [facility] OR;  Service: Thoracic;  Laterality: N/A;   STERNOTOMY N/A 12/26/2017   Procedure: REDO STERNOTOMY;  Surgeon: Fleeta Hanford, Coy, MD;  Location: [facility] OR;  Service: Thoracic;  Laterality: N/A;   TEE WITHOUT CARDIOVERSION N/A 12/14/2016   Procedure: TRANSESOPHAGEAL ECHOCARDIOGRAM (TEE);  Surgeon: Fleeta Hanford, Coy, MD;  Location: [facility] OR;  Service: Open Heart Surgery;  Laterality: N/A;   TEE WITHOUT CARDIOVERSION N/A 08/12/2020   Procedure: TRANSESOPHAGEAL ECHOCARDIOGRAM (TEE);  Surgeon: Verlin Lonni BIRCH, MD;  Location: [facility] INVASIVE CV LAB;  Service: Open Heart Surgery;  Laterality: N/A;   TRANSCATHETER AORTIC VALVE REPLACEMENT, TRANSFEMORAL N/A 08/12/2020   Procedure: TRANSCATHETER AORTIC VALVE REPLACEMENT, TRANSFEMORAL;  Surgeon: Verlin Lonni BIRCH, MD;  [facility];  Service: Open Heart Surgery;  Laterality: N/A;   VIDEO ASSISTED THORACOSCOPY (VATS)/WEDGE RESECTION Right 10/26/2018   Procedure: VIDEO ASSISTED THORACOSCOPY (VATS)/WEDGE RESECTION;  Surgeon: Shyrl Linnie KIDD, MD;  Location: [facility] OR;  Service: Thoracic;  Laterality: Right;   VIDEO BRONCHOSCOPY N/A 10/26/2018   Procedure: VIDEO BRONCHOSCOPY;  Surgeon: Shyrl Linnie KIDD, MD;  Location: [facility] OR;  Service: Thoracic;  Laterality: N/A;  No Known Allergies    Family History  Problem Relation Age of Onset   Heart attack Mother    Cancer Mother    Diabetes Mother    Heart disease Mother    Heart attack Father    Heart disease Father      Social History Ms. Shannahan reports that she quit smoking about 16 months ago. Her smoking use included cigarettes. She started smoking about 41 years ago. She has never used smokeless tobacco. Ms. Janney reports no history of alcohol  use.    Physical Examination Today's Vitals   08/24/23 1419  BP: (!) 100/58  Pulse: 91  Resp: 16  SpO2: 95%  Weight: 133 lb 3.2 oz (60.4 kg)  Height: 5' 4 (1.626 m)   Body mass index is 22.86 kg/m.  Gen: resting comfortably, no acute distress HEENT: no scleral icterus, pupils equal round and reactive, no palptable cervical adenopathy,  CV: RRR, 2/6 systolic murmur rusb, no jvd Resp: Clear to auscultation bilaterally GI: abdomen is soft, non-tender, non-distended, normal bowel sounds, no hepatosplenomegaly MSK: extremities are warm, no edema.  Skin: warm, no rash Neuro:  no focal deficits Psych: appropriate affect   Diagnostic Studies  06/2015 echo Study Conclusions   - Left ventricle: The cavity size was normal. Wall thickness was   increased in a pattern of mild concentric LVH, with moderate   focal basal septal hypertrophy. Systolic function was normal. The   estimated ejection fraction was in the range of 60% to 65%. Wall   motion was normal; there were no regional wall motion    abnormalities. Doppler parameters are consistent with abnormal   left ventricular relaxation (grade 1 diastolic dysfunction).   Indeterminate filling pressures. - Aortic valve: Mildly to moderately calcified annulus. Mildly   thickened, mildly calcified leaflets. There was mild stenosis.   Peak velocity (S): 231 cm/s. Mean gradient (S): 9 mm Hg. Valve   area (VTI): 1.69 cm^2. Valve area (Vmean): 1.69 cm^2. - Mitral valve: Mildly calcified annulus. Normal thickness leaflets   .   06/2015 Stress MPI Equivocal ST segment depression, 0.5 mm in leads II, III, aVF, and V4 through V6. Resolved quickly in recovery. Intermediate risk Duke treadmill score of 1.5 based on limited exercise time and nondiagnostic ST segment change. Blood pressure demonstrated a hypertensive response to exercise. Small, mild intensity, basal septal defect that exhibits partial reversibility, suspect related to variable soft tissue attenuation rather than ischemia. This is a low risk study based on perfusion imaging. Nuclear stress EF: 75%.   11/2016 cath Mid RCA lesion, 60 %stenosed. Prox RCA lesion, 40 %stenosed. Ost RPDA to RPDA lesion, 90 %stenosed. Ost Cx to Prox Cx lesion, 100 %stenosed. Mid LAD lesion, 80 %stenosed. Ost 2nd Diag to 2nd Diag lesion, 40 %stenosed. Prox LAD to Mid LAD lesion, 40 %stenosed. Ost LM to LM lesion, 99 %stenosed.   1. Severe distal left main stenosis 2. Severe mid LAD stenosis 3. 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  4. Moderately severe, calcified stenosis of the mid RCA 5. Elevated filling pressures.    11/2016 echo Mid RCA lesion, 60 %stenosed. Prox RCA lesion, 40 %stenosed. Ost RPDA to RPDA lesion, 90 %stenosed. Ost Cx to Prox Cx lesion, 100 %stenosed. Mid LAD lesion, 80 %stenosed. Ost 2nd Diag to 2nd Diag lesion, 40 %stenosed. Prox LAD to Mid LAD lesion, 40 %stenosed. Ost LM to LM lesion, 99 %stenosed.   1.  Severe distal  left main stenosis 2. Severe mid LAD stenosis 3. 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  4. Moderately severe, calcified stenosis of the mid RCA 5. Elevated filling pressures.    12/2016 echo Study Conclusions   - HPI and indications: Limited for LV function & Rule out effusion. - Left ventricle: Systolic function was mildly reduced. The   estimated ejection fraction was in the range of 45% to 50%. - Aortic valve: Mildly calcified leaflets. Mild to moderate   st[place]ois. Mean gradient (S): 17 mm Hg. Peak gradient (S): 28 mm   Hg. Valve area (VTI): 1.01 cm^2. Valve area (Vmax): 1.1 cm^2.   Valve area (Vmean): 1.02 cm^2.   Impressions:   - Compared to the recent study on 12/12/16, the LVEF is unchaged at   45-50%. There is mild to moderate aortic stenosis. No pericardial   effusion is noted.   11/2017 echo Study Conclusions   - Left ventricle: The cavity size was normal. There was mild    concentric hypertrophy. Systolic function was normal. Wall motion    was normal; there were no regional wall motion abnormalities.    Doppler parameters are consistent with abnormal left ventricular    relaxation (grade 1 diastolic dysfunction). Doppler parameters    are consistent with high ventricular filling pressure.  - Aortic valve: Valve mobility was restricted. There was mild    stenosis. There was mild regurgitation. Peak velocity (S): 288    cm/s. Mean gradient (S): 17 mm Hg. Valve area (VTI): 0.83 cm^2.    Valve area (Vmax): 0.85 cm^2. Valve area (Vmean): 0.87 cm^2.  - Mitral valve: Transvalvular velocity was within the normal range.    There was no evidence for stenosis. There was trivial    regurgitation.  - Right ventricle: The cavity size was normal. Wall thickness was    normal. Systolic function was normal.  - Atrial septum: No defect or patent foramen ovale was identified.  - Tricuspid valve: There was  mild regurgitation.  - Pulmonary arteries: PA peak pressure: 27 mm Hg (S).      10/2018 nuclear stress The left ventricular ejection fraction is normal (55-65%). Nuclear stress EF: 56%. There was no ST segment deviation noted during stress. The study is normal. This is a low risk study.     04/2020 echo IMPRESSIONS     1. Basal inferior akinesis. Left ventricular ejection fraction, by  estimation, is 55 to 60%. The left ventricle has normal function. Left  ventricular diastolic parameters are indeterminate.   2. Right ventricular systolic function is low normal. The right  ventricular size is mildly enlarged.   3. Trivial mitral valve regurgitation.   4. AV is thickened, calcified with restricted motion. Peak and mean  gradients through the valve are 68 and 43 mm Hg respectively Dimensionless  index is 0.23 consistent with severe AS. Compared to echo from 9.19.21,  gradients are increased. The aortic  valve is tricuspid. Aortic valve regurgitation is not visualized.   5. The inferior vena cava is normal in size with greater than 50%  respiratory variability, suggesting right atrial pressure of 3 mmHg.      05/2020 cath Ost LM to LM lesion is 99% stenosed. Ost 2nd Diag to 2nd Diag lesion is 40% stenosed. Mid LAD lesion is 30% stenosed. Prox LAD to Mid LAD lesion is 40% stenosed. Ost Cx to Prox Cx lesion is 100% stenosed. Mid RCA lesion is 30% stenosed.  Prox RCA lesion is 80% stenosed. Ost RPDA to RPDA lesion is 90% stenosed. Dist RCA lesion is 50% stenosed. 2nd Mrg lesion is 50% stenosed. Mid LM to Prox LAD lesion is 95% stenosed. Mid Graft to Insertion lesion is 100% stenosed.   1. Severe left main stenosis 2. Severe ostial LAD stenosis. Patent Vein graft to the Diagonal Taaj Hurlbut that fills the LAD. The LIMA in injected and does not fill the LAD although it may just be atretic distally. It is not clear that the LIMA was anastomosed to the LAD. The operative note indicates the  LIMA was used.  3. Chronic occlusion ostial Circumflex. Patent vein graft to the obtuse marginal Oda Placke with moderate disease in the obtuse marginal Kristilyn Coltrane.  4. The RCA is a large dominant vessel. There is a heavily calcified proximal stenosis that appears to be severe angiographically. RFR with flow wire was 0.89 suggesting the lesion is flow limiting.  5. Severe aortic stenosis (mean gradient 40.3 mmHg, peak to peak gradient 55 mmHg, AVA 0.75 cm2)   Recommendations: She will need to have staged PCI of the RCA with orbital atherectomy and stenting. I will plan this for next week. After the PCI, will then continue planning for TAVR with CT scans.      Assessment and Plan   1. Severe aortic stenosis s/p TAVR - denies any symptoms, continue current meds   2. CAD - no symptoms, continue current meds   3. Chronic HFpEF - euvolemic, continue current therapy  4. . Hyperlipidemia - lipids at goal, continue current meds    Dorn PHEBE Ross, M.D.

## 2023-08-24 NOTE — Patient Instructions (Signed)
 Medication Instructions:  Your physician recommends that you continue on your current medications as directed. Please refer to the Current Medication list given to you today.   Labwork: None  Testing/Procedures: Your physician has requested that you have a carotid duplex. This test is an ultrasound of the carotid arteries in your neck. It looks at blood flow through these arteries that supply the brain with blood. Allow one hour for this exam. There are no restrictions or special instructions.   Follow-Up: Your physician recommends that you schedule a follow-up appointment in: 6 months  Any Other Special Instructions Will Be Listed Below (If Applicable). Thank you for choosing [facility]!     If you need a refill on your cardiac medications before your next appointment, please call your pharmacy.

## 2023-08-25 ENCOUNTER — Telehealth: Payer: Self-pay | Admitting: Cardiology

## 2023-08-25 NOTE — Telephone Encounter (Signed)
 Spoke with patient - informed patient that Dr. Alvan did not make any changes to her oxygen .    After further discussion with patient, she states that the technician increased her oxygen  after she had just gotten up on the table to be checked in.  I would assume that her level was placed back at her regular dosing after she recovered from the activity of getting up on to the table.    I discussed that with the patient as well.  She stated that she was trying to get some small portable oxygen  tanks before the weekend.  She asked that I send the notes to Dr. Rosamond office.    I will fax this note as well.

## 2023-08-25 NOTE — Telephone Encounter (Signed)
 New Message:      Patient said she Dr Alvan yesterday(08-24-23). She said they had to adjust her oxygen  , while she was there.  Dr Rosamond would like that report faxed to him asap. Please put Attention: Randine.

## 2023-08-29 DIAGNOSIS — Z0000 Encounter for general adult medical examination without abnormal findings: Secondary | ICD-10-CM | POA: Diagnosis not present

## 2023-08-29 DIAGNOSIS — Z1339 Encounter for screening examination for other mental health and behavioral disorders: Secondary | ICD-10-CM | POA: Diagnosis not present

## 2023-08-29 DIAGNOSIS — E7800 Pure hypercholesterolemia, unspecified: Secondary | ICD-10-CM | POA: Diagnosis not present

## 2023-08-29 DIAGNOSIS — I272 Pulmonary hypertension, unspecified: Secondary | ICD-10-CM | POA: Diagnosis not present

## 2023-08-29 DIAGNOSIS — Z299 Encounter for prophylactic measures, unspecified: Secondary | ICD-10-CM | POA: Diagnosis not present

## 2023-08-29 DIAGNOSIS — Z1331 Encounter for screening for depression: Secondary | ICD-10-CM | POA: Diagnosis not present

## 2023-08-29 DIAGNOSIS — R5383 Other fatigue: Secondary | ICD-10-CM | POA: Diagnosis not present

## 2023-08-29 DIAGNOSIS — Z79899 Other long term (current) drug therapy: Secondary | ICD-10-CM | POA: Diagnosis not present

## 2023-08-29 DIAGNOSIS — Z7189 Other specified counseling: Secondary | ICD-10-CM | POA: Diagnosis not present

## 2023-09-12 ENCOUNTER — Ambulatory Visit: Attending: Cardiology

## 2023-09-12 DIAGNOSIS — I6523 Occlusion and stenosis of bilateral carotid arteries: Secondary | ICD-10-CM

## 2023-09-23 DIAGNOSIS — J449 Chronic obstructive pulmonary disease, unspecified: Secondary | ICD-10-CM | POA: Diagnosis not present

## 2023-09-26 DIAGNOSIS — R251 Tremor, unspecified: Secondary | ICD-10-CM | POA: Diagnosis not present

## 2023-09-26 DIAGNOSIS — I272 Pulmonary hypertension, unspecified: Secondary | ICD-10-CM | POA: Diagnosis not present

## 2023-09-26 DIAGNOSIS — Z299 Encounter for prophylactic measures, unspecified: Secondary | ICD-10-CM | POA: Diagnosis not present

## 2023-09-26 DIAGNOSIS — F1320 Sedative, hypnotic or anxiolytic dependence, uncomplicated: Secondary | ICD-10-CM | POA: Diagnosis not present

## 2023-09-26 DIAGNOSIS — I2510 Atherosclerotic heart disease of native coronary artery without angina pectoris: Secondary | ICD-10-CM | POA: Diagnosis not present

## 2023-09-26 DIAGNOSIS — I4891 Unspecified atrial fibrillation: Secondary | ICD-10-CM | POA: Diagnosis not present

## 2023-09-26 DIAGNOSIS — J441 Chronic obstructive pulmonary disease with (acute) exacerbation: Secondary | ICD-10-CM | POA: Diagnosis not present

## 2023-10-06 ENCOUNTER — Ambulatory Visit: Admitting: Internal Medicine

## 2023-10-06 ENCOUNTER — Encounter: Payer: Self-pay | Admitting: Internal Medicine

## 2023-10-06 ENCOUNTER — Ambulatory Visit (HOSPITAL_COMMUNITY)
Admission: RE | Admit: 2023-10-06 | Discharge: 2023-10-06 | Disposition: A | Discharge status: Home / Self Care | Source: Ambulatory Visit | Attending: Internal Medicine | Admitting: Internal Medicine

## 2023-10-06 VITALS — BP 137/66 | HR 87 | Ht 64.0 in | Wt 139.0 lb

## 2023-10-06 DIAGNOSIS — Z23 Encounter for immunization: Secondary | ICD-10-CM

## 2023-10-06 DIAGNOSIS — J811 Chronic pulmonary edema: Secondary | ICD-10-CM | POA: Diagnosis not present

## 2023-10-06 DIAGNOSIS — J449 Chronic obstructive pulmonary disease, unspecified: Secondary | ICD-10-CM

## 2023-10-06 DIAGNOSIS — J9612 Chronic respiratory failure with hypercapnia: Secondary | ICD-10-CM

## 2023-10-06 DIAGNOSIS — J9611 Chronic respiratory failure with hypoxia: Secondary | ICD-10-CM

## 2023-10-06 DIAGNOSIS — R918 Other nonspecific abnormal finding of lung field: Secondary | ICD-10-CM | POA: Diagnosis not present

## 2023-10-06 DIAGNOSIS — J90 Pleural effusion, not elsewhere classified: Secondary | ICD-10-CM | POA: Diagnosis not present

## 2023-10-06 MED ORDER — PREDNISONE 10 MG PO TABS: ORAL_TABLET | ORAL | 0 refills | Status: DC

## 2023-10-06 MED ORDER — BUDESONIDE 0.25 MG/2ML IN SUSP: RESPIRATORY_TRACT | 12 refills | Status: DC

## 2023-10-06 MED ORDER — IPRATROPIUM-ALBUTEROL 0.5-2.5 (3) MG/3ML IN SOLN: RESPIRATORY_TRACT | 11 refills | Status: DC

## 2023-10-06 NOTE — Progress Notes (Signed)
 Emily Rocha, female    DOB: 12-14-1955    MRN: [id number]   Brief patient profile:  68   yowf quit smoking 07/2023  with lots of childhood exp to ammonia products  referred to pulmonary clinic in [facility]  10/06/2023 by Rosamond  for copd eval    Pt not previously seen by PCCM service.  Since 10/2019 then with dx of GOLD 3 copd and mod severe AS but has had TAVR     History of Present Illness  10/06/2023  Pulmonary/ 1st office eval/ Emily Rocha / [facility]  Chief Complaint  Patient presents with   Shortness of Breath    Low sats - cough w thick white mucus  Dyspnea:  lowe's ok on 02  Cough: no better since stopped smoking> mostly mucoid, worse in am's Sleep: flat bed one pillow  SABA use:  neb twice daily not really prn  02: 2.5  24/7 new since d/c 08/10/23     No obvious day to day or daytime pattern/variability or assoc  purulent sputum or mucus plugs or hemoptysis or cp or chest tightness, subjective wheeze or overt sinus or hb symptoms.    Also denies any obvious fluctuation of symptoms with weather or environmental changes or other aggravating or alleviating factors except as outlined above   No unusual exposure hx or h/o childhood pna/ asthma or knowledge of premature birth.  Current Allergies, Complete Past Medical History, Past Surgical History, Family History, and Social History were reviewed in [employer] record.  ROS  The following are not active complaints unless bolded Hoarseness, sore throat, dysphagia, dental problems, itching, sneezing,  nasal congestion or discharge of excess mucus or purulent secretions, ear ache,   fever, chills, sweats, unintended wt loss or wt gain, classically pleuritic or exertional cp,  orthopnea pnd or arm/hand swelling  or leg swelling, presyncope, palpitations, abdominal pain, anorexia, nausea, vomiting, diarrhea  or change in bowel habits or change in bladder habits, change in stools or change in urine, dysuria,  hematuria,  rash, arthralgias, visual complaints, headache, numbness, weakness or ataxia or problems with walking or coordination,  change in mood or  memory.            Outpatient Medications Prior to Visit  Medication Sig Dispense Refill   albuterol  (PROVENTIL ) (2.5 MG/3ML) 0.083% nebulizer solution Take 2.5 mg by nebulization every 4 (four) hours as needed for wheezing or shortness of breath.     albuterol  (VENTOLIN  HFA) 108 (90 Base) MCG/ACT inhaler Inhale 1-2 puffs into the lungs every 6 (six) hours as needed for wheezing or shortness of breath.     ALPRAZolam  (XANAX ) 0.5 MG tablet Take 0.5 mg by mouth 3 (three) times daily.     Ascorbic Acid (VITAMIN C GUMMIE PO) Take 400 mg by mouth daily.     aspirin  EC 81 MG tablet Take 81 mg by mouth daily. Swallow whole.     atorvastatin  (LIPITOR ) 40 MG tablet Take 1 tablet (40 mg total) daily by mouth. 30 tablet 1   diphenhydramine-acetaminophen  (TYLENOL  PM) 25-500 MG TABS tablet Take 2 tablets by mouth at bedtime.     magnesium  oxide (MAG-OX) 400 (240 Mg) MG tablet Take 400 mg by mouth daily.     metoprolol  succinate (TOPROL -XL) 25 MG 24 hr tablet TAKE 0.5 TABLETS (12.5 MG TOTAL) BY MOUTH DAILY 45 tablet 3   pantoprazole  (PROTONIX ) 40 MG tablet Take 40 mg by mouth daily.     potassium chloride   SA (KLOR-CON ) 20 MEQ tablet Take 20 mEq by mouth in the morning.     predniSONE  (DELTASONE ) 20 MG tablet Take 3 tablets by mouth daily x 2 days; then 2 tablets by mouth daily x 2 days; then 1 tablet by mouth daily x 3 days; then half tablet by mouth daily x 3 days and stop prednisone . 15 tablet 0   torsemide  (DEMADEX ) 20 MG tablet Take 40 mg by mouth as needed (based on her weight).     TRELEGY ELLIPTA 100-62.5-25 MCG/ACT AEPB Inhale 1 puff into the lungs daily.     Multiple Vitamins-Minerals (EYE HEALTH AREDS 2 PO) Take 1 capsule by mouth in the morning and at bedtime. (Patient not taking: Reported on 10/06/2023)     No facility-administered medications  prior to visit.    Past Medical History:  Diagnosis Date   Anxiety    Asthma    COPD (chronic obstructive pulmonary disease) (HCC)    Coronary artery disease    a. s/p NSTEMI in 11/2016 and required CABG with LIMA-LAD, SVG-LCx and SVG-D1 and complicated by cardiogenic shock b. cath in 05/2020 showing severe RCA stenosis and treated with orbital atherectomy and stent placement   GERD (gastroesophageal reflux disease)    History of CVA (cerebrovascular accident)    History of lung cancer 2020   Peripheral vascular disease (HCC)    S/P TAVR (transcatheter aortic valve replacement) 08/12/2020   s/p TAVR with a 23 mm Edwards S3U via the TF approach by Dr. Verlin & Dr. Lucas   Severe aortic stenosis       Objective:     BP 137/66   Pulse 87   Ht 5' 4 (1.626 m)   Wt 139 lb (63 kg)   SpO2 94% Comment: placed on 2 l cont  BMI 23.86 kg/m   SpO2: 94 % (placed on 2 l cont)  Hoarse amb wf freq throat clearing / congested sounding cough   HEENT : Oropharynx  cler   Nasal turbinates nl    NECK :  without  apparent JVD/ palpable Nodes/TM    LUNGS: no acc muscle use,  Mild barrel/kyphtoic  contour chest wall with bilateral  Distant bs s audible wheeze and  without cough on insp or exp maneuvers  and mild  Hyperresonant  to  percussion bilaterally     CV:  RRR  no s3 or murmur or increase in P2, and no edema   ABD:  soft and nontender with pos end  insp Hoover's  in the supine position.  No bruits or organomegaly appreciated   MS:  Nl gait/ ext warm without deformities Or obvious joint restrictions  calf tenderness, cyanosis or clubbing     SKIN: warm and dry without lesions    NEURO:  alert, approp, nl sensorium with  no motor or cerebellar deficits apparent.     CXR PA and Lateral:   10/06/2023 :    I personally reviewed images and impression is as follows:    Cardiomegaly with coarse markings and R effusion improved vs priors/ also mod kyphosis     Assessment        Assessment & Plan Need for vaccination against Streptococcus pneumoniae  COPD mixed type (HCC) Quit smoking 07/2023  - PFT's  09/21/18  FEV1 1.08 (42 % ) ratio 0.63  p 0 % improvement from saba p symbicort  prior to study with DLCO  12.14 (59%)   and FV curve mildly concave  - D/c trelegy  10/06/2023 and replaced with duoneb/ bud 0.25 mg bid due to cough  - Flutter valve added 10/06/2023    Group D (now reclassified as E) in terms of symptom/risk and laba/lama/ICS  therefore appropriate rx at this point >>>  trelegy / breztri best options but presently her cough is her main concern so will just use neb /flutter/ mucinex  dm x 4 weeks then return to regroup with all meds in hand using a trust but verify approach to confirm accurate Medication  Reconciliation The principal here is that until we are certain that the  patients are doing what we've asked, it makes no sense to ask them to do more.       Chronic respiratory failure with hypoxia and hypercapnia (HCC) HC03   08/09/22   =33 at d/c from admit on 2.5 lpm - 10/06/2023   Walked on 2.pm   x  3  lap(s) =  approx 450  ft  @ mod pace, stopped due to end of study  with lowest 02 sats 88% and no complaints    Rec 24 h 02 for now titrated to sats > 90%   see avs for instructions unique to this ov   Each maintenance medication was reviewed in detail including emphasizing most importantly the difference between maintenance and prns and under what circumstances the prns are to be triggered using an action plan format where appropriate.  Total time for H and P, chart review, counseling, reviewing neb/02/pulse ox  device(s) , directly observing portions of ambulatory 02 saturation study/ and generating customized AVS unique to this office visit / same day charting = 46  min new pt eval                    Patient Instructions  As long as coughing >  Pantoprazole  (protonix ) 40 mg  Take 30- 60 min before your first and last meals of the day   GERD  (REFLUX)  is an extremely common cause of respiratory symptoms just like yours , many times with no obvious heartburn at all.    It can be treated with medication, but also with lifestyle changes including elevation of the head of your bed (ideally with 6 -8inch blocks under the headboard of your bed),  Smoking cessation, avoidance of late meals, excessive alcohol , and avoid fatty foods, chocolate, peppermint, colas, red wine, and acidic juices such as orange juice.  NO MINT OR MENTHOL PRODUCTS SO NO COUGH DROPS  USE SUGARLESS CANDY INSTEAD (Jolley ranchers or Stover's or Life Savers) or even ice chips will also do - the key is to swallow to prevent all throat clearing. NO OIL BASED VITAMINS - use powdered substitutes.  Avoid fish oil when coughing.   Prednisone  10 mg take  4 each am x 2 days,   2 each am x 2 days,  1 each am x 2 days and stop   Stop trelegy   Start duoneb with budesonide  0.25 mg twice daily (1st thing in am and again at supper)   In between the duonebs ok to use the albuterol  up to every 4 hours as needed prefer the neb   For cough/ congestion >  mucinex  dm  up to maximum of  1200 mg every 12 hours and use the flutter valve as much as you can    Make sure you check your oxygen  saturation  AT  your highest level of activity (not after you stop)   to be sure  it stays over 90% and adjust  02 flow upward to maintain this level if needed but remember to turn it back to previous settings when you stop (to conserve your supply).   Please remember to go to the  x-ray department  @  [facility] for your tests - we will call you with the results when they are available     Please schedule a follow up office visit in 4 weeks, sooner if needed  with all medications /inhalers/ solutions in hand so we can verify exactly what you are taking. This includes all medications from all doctors and over the counters      Ozell America, MD 10/08/2023

## 2023-10-06 NOTE — Patient Instructions (Addendum)
 As long as coughing >  Pantoprazole  (protonix ) 40 mg  Take 30- 60 min before your first and last meals of the day   GERD (REFLUX)  is an extremely common cause of respiratory symptoms just like yours , many times with no obvious heartburn at all.    It can be treated with medication, but also with lifestyle changes including elevation of the head of your bed (ideally with 6 -8inch blocks under the headboard of your bed),  Smoking cessation, avoidance of late meals, excessive alcohol , and avoid fatty foods, chocolate, peppermint, colas, red wine, and acidic juices such as orange juice.  NO MINT OR MENTHOL PRODUCTS SO NO COUGH DROPS  USE SUGARLESS CANDY INSTEAD (Jolley ranchers or Stover's or Life Savers) or even ice chips will also do - the key is to swallow to prevent all throat clearing. NO OIL BASED VITAMINS - use powdered substitutes.  Avoid fish oil when coughing.   Prednisone  10 mg take  4 each am x 2 days,   2 each am x 2 days,  1 each am x 2 days and stop   Stop trelegy   Start duoneb with budesonide  0.25 mg twice daily (1st thing in am and again at supper)   In between the duonebs ok to use the albuterol  up to every 4 hours as needed prefer the neb   For cough/ congestion >  mucinex  dm  up to maximum of  1200 mg every 12 hours and use the flutter valve as much as you can    Make sure you check your oxygen  saturation  AT  your highest level of activity (not after you stop)   to be sure it stays over 90% and adjust  02 flow upward to maintain this level if needed but remember to turn it back to previous settings when you stop (to conserve your supply).   Please remember to go to the  x-ray department  @  [facility] for your tests - we will call you with the results when they are available     Please schedule a follow up office visit in 4 weeks, sooner if needed  with all medications /inhalers/ solutions in hand so we can verify exactly what you are taking. This includes all  medications from all doctors and over the counters

## 2023-10-08 NOTE — Assessment & Plan Note (Addendum)
 Quit smoking 07/2023  - PFT's  09/21/18  FEV1 1.08 (42 % ) ratio 0.63  p 0 % improvement from saba p symbicort  prior to study with DLCO  12.14 (59%)   and FV curve mildly concave  - D/c trelegy 10/06/2023 and replaced with duoneb/ bud 0.25 mg bid due to cough  - Flutter valve added 10/06/2023    Group D (now reclassified as E) in terms of symptom/risk and laba/lama/ICS  therefore appropriate rx at this point >>>  trelegy / breztri best options but presently her cough is her main concern so will just use neb /flutter/ mucinex  dm x 4 weeks then return to regroup with all meds in hand using a trust but verify approach to confirm accurate Medication  Reconciliation The principal here is that until we are certain that the  patients are doing what we've asked, it makes no sense to ask them to do more.

## 2023-10-08 NOTE — Assessment & Plan Note (Addendum)
 HC03   08/09/22   =33 at d/c from admit on 2.5 lpm - 10/06/2023   Walked on 2.pm   x  3  lap(s) =  approx 450  ft  @ mod pace, stopped due to end of study  with lowest 02 sats 88% and no complaints    Rec 24 h 02 for now titrated to sats > 90%   see avs for instructions unique to this ov   Each maintenance medication was reviewed in detail including emphasizing most importantly the difference between maintenance and prns and under what circumstances the prns are to be triggered using an action plan format where appropriate.  Total time for H and P, chart review, counseling, reviewing neb/02/pulse ox  device(s) , directly observing portions of ambulatory 02 saturation study/ and generating customized AVS unique to this office visit / same day charting = 46  min new pt eval

## 2023-10-10 ENCOUNTER — Ambulatory Visit: Payer: Self-pay | Admitting: Cardiology

## 2023-10-10 ENCOUNTER — Encounter: Payer: Self-pay | Admitting: *Deleted

## 2023-10-12 ENCOUNTER — Ambulatory Visit: Payer: Self-pay | Admitting: Internal Medicine

## 2023-10-12 NOTE — Progress Notes (Signed)
 Spoke with pt and relayed result, confirmed understanding nfn

## 2023-10-21 DIAGNOSIS — F1320 Sedative, hypnotic or anxiolytic dependence, uncomplicated: Secondary | ICD-10-CM | POA: Diagnosis not present

## 2023-10-21 DIAGNOSIS — I272 Pulmonary hypertension, unspecified: Secondary | ICD-10-CM | POA: Diagnosis not present

## 2023-10-21 DIAGNOSIS — R918 Other nonspecific abnormal finding of lung field: Secondary | ICD-10-CM | POA: Diagnosis not present

## 2023-10-21 DIAGNOSIS — R109 Unspecified abdominal pain: Secondary | ICD-10-CM | POA: Diagnosis not present

## 2023-10-21 DIAGNOSIS — I2510 Atherosclerotic heart disease of native coronary artery without angina pectoris: Secondary | ICD-10-CM | POA: Diagnosis not present

## 2023-10-21 DIAGNOSIS — I4891 Unspecified atrial fibrillation: Secondary | ICD-10-CM | POA: Diagnosis not present

## 2023-10-21 DIAGNOSIS — Z299 Encounter for prophylactic measures, unspecified: Secondary | ICD-10-CM | POA: Diagnosis not present

## 2023-10-21 DIAGNOSIS — J9611 Chronic respiratory failure with hypoxia: Secondary | ICD-10-CM | POA: Diagnosis not present

## 2023-10-24 DIAGNOSIS — J449 Chronic obstructive pulmonary disease, unspecified: Secondary | ICD-10-CM | POA: Diagnosis not present

## 2023-11-01 ENCOUNTER — Telehealth: Payer: Self-pay | Admitting: Internal Medicine

## 2023-11-01 DIAGNOSIS — I5032 Chronic diastolic (congestive) heart failure: Secondary | ICD-10-CM | POA: Diagnosis not present

## 2023-11-01 DIAGNOSIS — J9611 Chronic respiratory failure with hypoxia: Secondary | ICD-10-CM | POA: Diagnosis not present

## 2023-11-01 DIAGNOSIS — J449 Chronic obstructive pulmonary disease, unspecified: Secondary | ICD-10-CM | POA: Diagnosis not present

## 2023-11-01 DIAGNOSIS — F419 Anxiety disorder, unspecified: Secondary | ICD-10-CM | POA: Diagnosis not present

## 2023-11-01 DIAGNOSIS — F1320 Sedative, hypnotic or anxiolytic dependence, uncomplicated: Secondary | ICD-10-CM | POA: Diagnosis not present

## 2023-11-01 NOTE — Telephone Encounter (Signed)
 Patient requests TOC from Dr. Darlean to Dr. Alghanim

## 2023-11-08 ENCOUNTER — Ambulatory Visit: Admitting: Internal Medicine

## 2023-11-12 NOTE — Progress Notes (Unsigned)
   Established Patient Pulmonology Office Visit   Subjective:  Patient ID: Emily Rocha, female    DOB: 1955-04-24  MRN: [id number]  CC: No chief complaint on file.   HPI Emily Rocha is a 68 year old female with a past medical history significant for COPD [FEV1 1 L, 43% predicted], chronic hypoxic respiratory failure, severe AS status post TAVR who presents for follow-up.  She was last seen by Dr. Darlean in August 2025.  At that time he recommended doing DuoNebs 4 times daily and budesonide  twice daily as well as starting PPI for chronic cough.  {PULM QUESTIONNAIRES (Optional):33196}  ROS  {History (Optional):23778}  Current Outpatient Medications:    albuterol  (PROVENTIL ) (2.5 MG/3ML) 0.083% nebulizer solution, Take 2.5 mg by nebulization every 4 (four) hours as needed for wheezing or shortness of breath., Disp: , Rfl:    albuterol  (VENTOLIN  HFA) 108 (90 Base) MCG/ACT inhaler, Inhale 1-2 puffs into the lungs every 6 (six) hours as needed for wheezing or shortness of breath., Disp: , Rfl:    ALPRAZolam  (XANAX ) 0.5 MG tablet, Take 0.5 mg by mouth 3 (three) times daily., Disp: , Rfl:    Ascorbic Acid (VITAMIN C GUMMIE PO), Take 400 mg by mouth daily., Disp: , Rfl:    aspirin  EC 81 MG tablet, Take 81 mg by mouth daily. Swallow whole., Disp: , Rfl:    atorvastatin  (LIPITOR ) 40 MG tablet, Take 1 tablet (40 mg total) daily by mouth., Disp: 30 tablet, Rfl: 1   budesonide  (PULMICORT ) 0.25 MG/2ML nebulizer solution, One vial with duonbe twice daily, Disp: 120 mL, Rfl: 12   diphenhydramine-acetaminophen  (TYLENOL  PM) 25-500 MG TABS tablet, Take 2 tablets by mouth at bedtime., Disp: , Rfl:    ipratropium-albuterol  (DUONEB) 0.5-2.5 (3) MG/3ML SOLN, One in am and one at supper time, Disp: 180 mL, Rfl: 11   magnesium  oxide (MAG-OX) 400 (240 Mg) MG tablet, Take 400 mg by mouth daily., Disp: , Rfl:    metoprolol  succinate (TOPROL -XL) 25 MG 24 hr tablet, TAKE 0.5 TABLETS (12.5 MG TOTAL) BY MOUTH DAILY, Disp:  45 tablet, Rfl: 3   pantoprazole  (PROTONIX ) 40 MG tablet, Take 40 mg by mouth daily., Disp: , Rfl:    potassium chloride  SA (KLOR-CON ) 20 MEQ tablet, Take 20 mEq by mouth in the morning., Disp: , Rfl:    predniSONE  (DELTASONE ) 10 MG tablet, Take  4 each am x 2 days,   2 each am x 2 days,  1 each am x 2 days and stop, Disp: 14 tablet, Rfl: 0   torsemide  (DEMADEX ) 20 MG tablet, Take 40 mg by mouth as needed (based on her weight)., Disp: , Rfl:       Objective:  There were no vitals taken for this visit. {Pulm Vitals (Optional):32837}  Physical Exam   Diagnostic Review:  {Labs (Optional):32838}  -Bicarb elevated in the 30s - pCO2 elevated in the 70s -Chest x-ray in August 2025 reviewed and shows evidence of cardiomegaly as well as pulmonary vascular congestion concerning for pulmonary edema -CT chest February 2025 reviewed and shows diffuse emphysematous changes and a nodular density in the right lower lobe measuring 1 cm.  Patient also has mild bronchiectasis in the lower lobes bilaterally.    Assessment & Plan:   Assessment & Plan COPD GOLD 3 /   No orders of the defined types were placed in this encounter.     No follow-ups on file.   Hance Caspers, MD

## 2023-11-14 ENCOUNTER — Ambulatory Visit: Admitting: Pulmonary Disease

## 2023-11-14 ENCOUNTER — Encounter: Payer: Self-pay | Admitting: Pulmonary Disease

## 2023-11-14 VITALS — BP 100/65 | HR 84 | Ht 64.0 in | Wt 138.6 lb

## 2023-11-14 DIAGNOSIS — Z23 Encounter for immunization: Secondary | ICD-10-CM

## 2023-11-14 DIAGNOSIS — Z122 Encounter for screening for malignant neoplasm of respiratory organs: Secondary | ICD-10-CM | POA: Diagnosis not present

## 2023-11-14 DIAGNOSIS — J9611 Chronic respiratory failure with hypoxia: Secondary | ICD-10-CM | POA: Diagnosis not present

## 2023-11-14 DIAGNOSIS — J449 Chronic obstructive pulmonary disease, unspecified: Secondary | ICD-10-CM | POA: Diagnosis not present

## 2023-11-14 DIAGNOSIS — J9612 Chronic respiratory failure with hypercapnia: Secondary | ICD-10-CM

## 2023-11-14 DIAGNOSIS — F17210 Nicotine dependence, cigarettes, uncomplicated: Secondary | ICD-10-CM | POA: Diagnosis not present

## 2023-11-14 MED ORDER — TRELEGY ELLIPTA 100-62.5-25 MCG/ACT IN AEPB: 1.0000 | INHALATION_SPRAY | Freq: Every day | RESPIRATORY_TRACT | 6 refills | Status: DC

## 2023-11-14 MED ORDER — AZITHROMYCIN 250 MG PO TABS: 250.0000 mg | ORAL_TABLET | ORAL | 6 refills | Status: DC

## 2023-11-14 MED ORDER — SODIUM CHLORIDE 3 % IN NEBU: INHALATION_SOLUTION | Freq: Two times a day (BID) | RESPIRATORY_TRACT | 12 refills | Status: DC

## 2023-11-14 NOTE — Assessment & Plan Note (Addendum)
-   Grade 3, Class E, given recent exacerbation requiring hospital admission. She is currently on Trelegy (ICS/LAMA/LABA) as well as as needed SABA with Albuterol  MDI and SABA/SAMA with duonebs BID. EKG 07/2023 shows Qtc at 478. Therefore, I advised her to start on azithormycin MWF to decrease frequency of exacerbations. I also advised her to start hypertonic saline BID and flutter valve to help with mucociliary clearance.

## 2023-11-14 NOTE — Patient Instructions (Signed)
-   Please start Azithromycin  250 mg three times a week. Take it on Mondays, Wednesdays, and Fridays - Please mix the saline with your nebulizer treatment (duonebs) and do it twice daily to clear your phlegm production - You will receive a call from our lung cancer screening program to set up a CT scan for screening - You will also be contacted by a DME company to set up BiPAP at night - Come back to my office in 3 months.

## 2023-11-17 ENCOUNTER — Telehealth: Payer: Self-pay

## 2023-11-17 NOTE — Telephone Encounter (Signed)
 Atc x1 lmtcb

## 2023-11-17 NOTE — Telephone Encounter (Signed)
 Copied from CRM [id number]. Topic: Clinical - Prescription Issue >> Nov 14, 2023  2:51 PM Whitney O wrote: Reason for CRM: patient was just in with dr A and she says he sent in a prescription for saline sodium chloride  HYPERTONIC 3 % nebulizer solution [[id number]] but pharmacy will not fill it because insurance is not covering it . Patient does not know what to do . Please give patient a call back [phone] >> Nov 16, 2023 12:20 PM Rozanna MATSU wrote: Pt called to check the status of her saline sodium chloride  HYPERTONIC 3 % nebulizer solution [[id number]] stated she has not heard from anyone. She wants to see what other options she has to get the medicine because it is to high at the pharmacy.    Is there a cheaper neb solution for pt ?

## 2023-11-17 NOTE — Telephone Encounter (Signed)
 Can try Mucomyst nebulizer but the problem with it is that it smells like rotten eggs.

## 2023-11-18 NOTE — Telephone Encounter (Signed)
 Can we do a PA for this saline sodium chloride  HYPERTONIC 3 % nebulizer solution ?

## 2023-11-22 ENCOUNTER — Other Ambulatory Visit: Payer: Self-pay

## 2023-11-22 DIAGNOSIS — J449 Chronic obstructive pulmonary disease, unspecified: Secondary | ICD-10-CM

## 2023-11-22 MED ORDER — SODIUM CHLORIDE 3 % IN NEBU: INHALATION_SOLUTION | Freq: Two times a day (BID) | RESPIRATORY_TRACT | 12 refills | Status: DC

## 2023-11-22 NOTE — Telephone Encounter (Signed)
 Called [facility] to have them bill Medicare part B pharmacy states since they are a local pharmacy they do not bill medicare part b they suggest we send this [facility] to [facility] - called pt back to inform her of this and she was okay with me sending this rx of neb solutions to [facility].   Sent

## 2023-11-22 NOTE — Telephone Encounter (Signed)
 Patient paid $80 out of pocket for hypertonic solution. She wants to know if there are any coupons or did it need a PA?   Copied from CRM [id number]. Topic: General - Other >> Nov 17, 2023  4:11 PM Joesph PARAS wrote: Reason for CRM: Patient is returning call back to [facility]. PAS attempted to advise but patient is not listening or not understanding PAS despite multiple times repeating provider comment verbatim. Please return call to patient, who only wants to speak with the nurse.

## 2023-11-23 DIAGNOSIS — J449 Chronic obstructive pulmonary disease, unspecified: Secondary | ICD-10-CM | POA: Diagnosis not present

## 2023-11-24 ENCOUNTER — Other Ambulatory Visit: Payer: Self-pay | Admitting: Nurse Practitioner

## 2023-12-08 ENCOUNTER — Encounter (HOSPITAL_COMMUNITY): Payer: Self-pay | Admitting: Emergency Medicine

## 2023-12-08 ENCOUNTER — Inpatient Hospital Stay (HOSPITAL_COMMUNITY)
Admission: EM | Admit: 2023-12-08 | Discharge: 2023-12-20 | DRG: 292 | Disposition: A | Discharge status: Home / Self Care | Attending: Internal Medicine | Admitting: Internal Medicine

## 2023-12-08 ENCOUNTER — Emergency Department (HOSPITAL_COMMUNITY)

## 2023-12-08 ENCOUNTER — Other Ambulatory Visit: Payer: Self-pay

## 2023-12-08 DIAGNOSIS — Z86711 Personal history of pulmonary embolism: Secondary | ICD-10-CM | POA: Diagnosis not present

## 2023-12-08 DIAGNOSIS — E871 Hypo-osmolality and hyponatremia: Secondary | ICD-10-CM | POA: Diagnosis not present

## 2023-12-08 DIAGNOSIS — J439 Emphysema, unspecified: Secondary | ICD-10-CM | POA: Diagnosis not present

## 2023-12-08 DIAGNOSIS — Z66 Do not resuscitate: Secondary | ICD-10-CM | POA: Diagnosis present

## 2023-12-08 DIAGNOSIS — Z7982 Long term (current) use of aspirin: Secondary | ICD-10-CM

## 2023-12-08 DIAGNOSIS — Z79899 Other long term (current) drug therapy: Secondary | ICD-10-CM

## 2023-12-08 DIAGNOSIS — Z9049 Acquired absence of other specified parts of digestive tract: Secondary | ICD-10-CM

## 2023-12-08 DIAGNOSIS — I480 Paroxysmal atrial fibrillation: Secondary | ICD-10-CM | POA: Diagnosis present

## 2023-12-08 DIAGNOSIS — Z7189 Other specified counseling: Secondary | ICD-10-CM | POA: Diagnosis not present

## 2023-12-08 DIAGNOSIS — K219 Gastro-esophageal reflux disease without esophagitis: Secondary | ICD-10-CM | POA: Diagnosis present

## 2023-12-08 DIAGNOSIS — I5082 Biventricular heart failure: Secondary | ICD-10-CM | POA: Diagnosis present

## 2023-12-08 DIAGNOSIS — I509 Heart failure, unspecified: Principal | ICD-10-CM

## 2023-12-08 DIAGNOSIS — I708 Atherosclerosis of other arteries: Secondary | ICD-10-CM | POA: Diagnosis present

## 2023-12-08 DIAGNOSIS — I2510 Atherosclerotic heart disease of native coronary artery without angina pectoris: Secondary | ICD-10-CM | POA: Diagnosis present

## 2023-12-08 DIAGNOSIS — J449 Chronic obstructive pulmonary disease, unspecified: Secondary | ICD-10-CM | POA: Diagnosis present

## 2023-12-08 DIAGNOSIS — E785 Hyperlipidemia, unspecified: Secondary | ICD-10-CM | POA: Diagnosis present

## 2023-12-08 DIAGNOSIS — I110 Hypertensive heart disease with heart failure: Principal | ICD-10-CM | POA: Diagnosis present

## 2023-12-08 DIAGNOSIS — J90 Pleural effusion, not elsewhere classified: Secondary | ICD-10-CM | POA: Diagnosis not present

## 2023-12-08 DIAGNOSIS — I5033 Acute on chronic diastolic (congestive) heart failure: Secondary | ICD-10-CM | POA: Diagnosis not present

## 2023-12-08 DIAGNOSIS — Z87891 Personal history of nicotine dependence: Secondary | ICD-10-CM

## 2023-12-08 DIAGNOSIS — Z902 Acquired absence of lung [part of]: Secondary | ICD-10-CM

## 2023-12-08 DIAGNOSIS — I083 Combined rheumatic disorders of mitral, aortic and tricuspid valves: Secondary | ICD-10-CM | POA: Diagnosis present

## 2023-12-08 DIAGNOSIS — E874 Mixed disorder of acid-base balance: Secondary | ICD-10-CM | POA: Diagnosis not present

## 2023-12-08 DIAGNOSIS — I2781 Cor pulmonale (chronic): Secondary | ICD-10-CM | POA: Diagnosis present

## 2023-12-08 DIAGNOSIS — Z952 Presence of prosthetic heart valve: Secondary | ICD-10-CM | POA: Diagnosis not present

## 2023-12-08 DIAGNOSIS — I25810 Atherosclerosis of coronary artery bypass graft(s) without angina pectoris: Secondary | ICD-10-CM | POA: Diagnosis not present

## 2023-12-08 DIAGNOSIS — Z951 Presence of aortocoronary bypass graft: Secondary | ICD-10-CM

## 2023-12-08 DIAGNOSIS — I50810 Right heart failure, unspecified: Secondary | ICD-10-CM

## 2023-12-08 DIAGNOSIS — R251 Tremor, unspecified: Secondary | ICD-10-CM | POA: Diagnosis present

## 2023-12-08 DIAGNOSIS — Z833 Family history of diabetes mellitus: Secondary | ICD-10-CM

## 2023-12-08 DIAGNOSIS — J918 Pleural effusion in other conditions classified elsewhere: Secondary | ICD-10-CM | POA: Diagnosis present

## 2023-12-08 DIAGNOSIS — D638 Anemia in other chronic diseases classified elsewhere: Secondary | ICD-10-CM | POA: Diagnosis present

## 2023-12-08 DIAGNOSIS — J9611 Chronic respiratory failure with hypoxia: Secondary | ICD-10-CM | POA: Diagnosis not present

## 2023-12-08 DIAGNOSIS — Z955 Presence of coronary angioplasty implant and graft: Secondary | ICD-10-CM

## 2023-12-08 DIAGNOSIS — I272 Pulmonary hypertension, unspecified: Secondary | ICD-10-CM | POA: Diagnosis not present

## 2023-12-08 DIAGNOSIS — R0602 Shortness of breath: Secondary | ICD-10-CM | POA: Diagnosis not present

## 2023-12-08 DIAGNOSIS — I739 Peripheral vascular disease, unspecified: Secondary | ICD-10-CM | POA: Diagnosis present

## 2023-12-08 DIAGNOSIS — Z85118 Personal history of other malignant neoplasm of bronchus and lung: Secondary | ICD-10-CM

## 2023-12-08 DIAGNOSIS — J9621 Acute and chronic respiratory failure with hypoxia: Secondary | ICD-10-CM | POA: Diagnosis present

## 2023-12-08 DIAGNOSIS — I350 Nonrheumatic aortic (valve) stenosis: Secondary | ICD-10-CM | POA: Diagnosis not present

## 2023-12-08 DIAGNOSIS — R0989 Other specified symptoms and signs involving the circulatory and respiratory systems: Secondary | ICD-10-CM | POA: Diagnosis not present

## 2023-12-08 DIAGNOSIS — F419 Anxiety disorder, unspecified: Secondary | ICD-10-CM | POA: Diagnosis present

## 2023-12-08 DIAGNOSIS — R918 Other nonspecific abnormal finding of lung field: Secondary | ICD-10-CM | POA: Diagnosis not present

## 2023-12-08 DIAGNOSIS — Z7984 Long term (current) use of oral hypoglycemic drugs: Secondary | ICD-10-CM

## 2023-12-08 DIAGNOSIS — Z881 Allergy status to other antibiotic agents status: Secondary | ICD-10-CM

## 2023-12-08 DIAGNOSIS — Z8249 Family history of ischemic heart disease and other diseases of the circulatory system: Secondary | ICD-10-CM

## 2023-12-08 DIAGNOSIS — Z9981 Dependence on supplemental oxygen: Secondary | ICD-10-CM

## 2023-12-08 DIAGNOSIS — I2729 Other secondary pulmonary hypertension: Secondary | ICD-10-CM | POA: Diagnosis present

## 2023-12-08 DIAGNOSIS — J811 Chronic pulmonary edema: Secondary | ICD-10-CM | POA: Diagnosis not present

## 2023-12-08 DIAGNOSIS — Z7951 Long term (current) use of inhaled steroids: Secondary | ICD-10-CM

## 2023-12-08 DIAGNOSIS — Z7901 Long term (current) use of anticoagulants: Secondary | ICD-10-CM

## 2023-12-08 DIAGNOSIS — I252 Old myocardial infarction: Secondary | ICD-10-CM

## 2023-12-08 DIAGNOSIS — I4891 Unspecified atrial fibrillation: Secondary | ICD-10-CM

## 2023-12-08 DIAGNOSIS — Z515 Encounter for palliative care: Secondary | ICD-10-CM | POA: Diagnosis not present

## 2023-12-08 DIAGNOSIS — Z8673 Personal history of transient ischemic attack (TIA), and cerebral infarction without residual deficits: Secondary | ICD-10-CM

## 2023-12-08 LAB — CBC
HCT: 37.8 % (ref 36.0–46.0)
Hemoglobin: 11.2 g/dL — ABNORMAL LOW (ref 12.0–15.0)
MCH: 29 pg (ref 26.0–34.0)
MCHC: 29.6 g/dL — ABNORMAL LOW (ref 30.0–36.0)
MCV: 97.9 fL (ref 80.0–100.0)
Platelets: 181 K/uL (ref 150–400)
RBC: 3.86 MIL/uL — ABNORMAL LOW (ref 3.87–5.11)
RDW: 15.9 % — ABNORMAL HIGH (ref 11.5–15.5)
WBC: 5.8 K/uL (ref 4.0–10.5)
nRBC: 0 % (ref 0.0–0.2)

## 2023-12-08 LAB — BASIC METABOLIC PANEL WITH GFR
Anion gap: 6 (ref 5–15)
BUN: 12 mg/dL (ref 8–23)
CO2: 40 mmol/L — ABNORMAL HIGH (ref 22–32)
Calcium: 9.2 mg/dL (ref 8.9–10.3)
Chloride: 93 mmol/L — ABNORMAL LOW (ref 98–111)
Creatinine, Ser: 0.54 mg/dL (ref 0.44–1.00)
GFR, Estimated: >60 mL/min (ref >60)
Glucose, Bld: 95 mg/dL (ref 70–99)
Potassium: 4.1 mmol/L (ref 3.5–5.1)
Sodium: 139 mmol/L (ref 135–145)

## 2023-12-08 LAB — TROPONIN T, HIGH SENSITIVITY
Troponin T High Sensitivity: 19 ng/L (ref 0–19)
Troponin T High Sensitivity: 17 ng/L (ref 0–19)

## 2023-12-08 LAB — PRO BRAIN NATRIURETIC PEPTIDE: Pro Brain Natriuretic Peptide: 2590 pg/mL — ABNORMAL HIGH (ref <300.0)

## 2023-12-08 MED ORDER — FUROSEMIDE 10 MG/ML IJ SOLN
40.0000 mg | Freq: Once | INTRAMUSCULAR | Status: AC
Start: 2023-12-08 — End: 2023-12-08
  Administered 2023-12-08: 40 mg via INTRAVENOUS
  Filled 2023-12-08: qty 4

## 2023-12-08 MED ORDER — ALPRAZOLAM 0.5 MG PO TABS
0.5000 mg | ORAL_TABLET | Freq: Once | ORAL | Status: AC
  Administered 2023-12-08: 0.5 mg via ORAL
  Filled 2023-12-08: qty 1

## 2023-12-08 NOTE — ED Triage Notes (Signed)
 Pt reports heart fluttering, hx of Afib; increased fluid retention, hx of CHF, SOB, pt on home O2 with need to increase flow starting today

## 2023-12-08 NOTE — ED Notes (Signed)
 Pt endorses she weighted herself this AM and it was 144. Pt endorses that she usually 135-138 lbs. Pt endorses that she did take all medication like normal today and tried taking extra fluid pills with no extra urine noted due to taking the extra pills. Pt endorses trying to taking MOM with no help. Pt endorses that she is having diarrhea like soup every time she eats. Pt endorses that her colon is as small as a child's colon.

## 2023-12-08 NOTE — ED Provider Notes (Signed)
 [place] [facility] AT [facility]  Provider Note  CSN: [id number] Arrival date & time: 12/08/23 1707  History Chief Complaint  Patient presents with   Irregular Heart Beat    Emily Rocha is a 68 y.o. female with history of CABG, TAVR, partial lung resection and COPD on home oxygen  as well as CHF and one prior episode of afib while admitted for pneumonia/sepsis presents for several days of increasing SOB, fluid retention and DOE. She has increased her oxygen  use at home. She has felt her heart fluttering at times, similar to the one prior afib episode. She is not on anticoagulation both because she has not had a recurrence of afib and because she had a prior SDH while on anticoagulation of PE several years ago.    Home Medications Prior to Admission medications   Medication Sig Start Date End Date Taking? Authorizing Provider  albuterol  (PROVENTIL ) (2.5 MG/3ML) 0.083% nebulizer solution Take 2.5 mg by nebulization every 4 (four) hours as needed for wheezing or shortness of breath. 03/30/19   [provider]  albuterol  (VENTOLIN  HFA) 108 (90 Base) MCG/ACT inhaler Inhale 1-2 puffs into the lungs every 6 (six) hours as needed for wheezing or shortness of breath.    [provider]  ALPRAZolam  (XANAX ) 0.5 MG tablet Take 0.5 mg by mouth 3 (three) times daily.    [provider]  Ascorbic Acid (VITAMIN C GUMMIE PO) Take 400 mg by mouth daily.    [provider]  aspirin  EC 81 MG tablet Take 81 mg by mouth daily. Swallow whole.    [provider]  atorvastatin  (LIPITOR ) 40 MG tablet Take 1 tablet (40 mg total) daily by mouth. 12/27/16   Gold, Lemond BRAVO, PA-C  azithromycin  (ZITHROMAX ) 250 MG tablet Take 1 tablet (250 mg total) by mouth 3 (three) times a week. 11/14/23   Alghanim, Paula, MD  budesonide  (PULMICORT ) 0.25 MG/2ML nebulizer solution One vial with duonbe twice daily 10/06/23   Wert, Michael B, MD   diphenhydramine-acetaminophen  (TYLENOL  PM) 25-500 MG TABS tablet Take 2 tablets by mouth at bedtime.    [provider]  Fluticasone-Umeclidin-Vilant (TRELEGY ELLIPTA) 100-62.5-25 MCG/ACT AEPB Inhale 1 Inhalation into the lungs daily. 11/14/23   Alghanim, Paula, MD  ipratropium-albuterol  (DUONEB) 0.5-2.5 (3) MG/3ML SOLN One in am and one at supper time 10/06/23   Darlean Ozell NOVAK, MD  magnesium  oxide (MAG-OX) 400 (240 Mg) MG tablet Take 400 mg by mouth daily.    [provider]  metoprolol  succinate (TOPROL -XL) 25 MG 24 hr tablet TAKE 0.5 TABLETS (12.5 MG TOTAL) BY MOUTH DAILY 11/24/22   Miriam Norris, NP  pantoprazole  (PROTONIX ) 40 MG tablet Take 40 mg by mouth daily.    [provider]  potassium chloride  SA (KLOR-CON ) 20 MEQ tablet Take 20 mEq by mouth in the morning.    [provider]  primidone (MYSOLINE) 50 MG tablet Take 50 mg by mouth daily. 09/26/23   [provider]  sodium chloride  HYPERTONIC 3 % nebulizer solution Take by nebulization 2 (two) times daily. 11/22/23   Alghanim, Paula, MD  torsemide  (DEMADEX ) 20 MG tablet Take 40 mg by mouth as needed (based on her weight).    [provider]  TRELEGY ELLIPTA 100-62.5-25 MCG/ACT AEPB Inhale 1 puff into the lungs daily. 10/27/23   [provider]     Allergies    Levaquin  [levofloxacin ]   Review of Systems   Review of Systems Please see HPI  for pertinent positives and negatives  Physical Exam BP 99/64   Pulse 92   Temp 98.3 F (36.8 C) (Oral)   Resp (!) 31   Ht 5' 4 (1.626 m)   Wt 65.7 kg   SpO2 93%   BMI 24.85 kg/m   Physical Exam Vitals and nursing note reviewed.  Constitutional:      Appearance: Normal appearance.  HENT:     Head: Normocephalic and atraumatic.     Nose: Nose normal.     Mouth/Throat:     Mouth: Mucous membranes are moist.  Eyes:     Extraocular Movements: Extraocular movements intact.     Conjunctiva/sclera: Conjunctivae normal.   Cardiovascular:     Rate and Rhythm: Normal rate. Rhythm irregular.  Pulmonary:     Effort: Pulmonary effort is normal.     Breath sounds: Wheezing and rales present.  Abdominal:     General: Abdomen is flat.     Palpations: Abdomen is soft.     Tenderness: There is no abdominal tenderness.  Musculoskeletal:        General: No swelling. Normal range of motion.     Cervical back: Neck supple.     Right lower leg: Edema present.     Left lower leg: Edema present.  Skin:    General: Skin is warm and dry.  Neurological:     General: No focal deficit present.     Mental Status: She is alert.  Psychiatric:        Mood and Affect: Mood normal.     ED Results / Procedures / Treatments   EKG EKG Interpretation Date/Time:  Thursday December 08 2023 22:09:14 EDT Ventricular Rate:  138 PR Interval:    QRS Duration:  101 QT Interval:  337 QTC Calculation: 511 R Axis:   111  Text Interpretation: Atrial fibrillation Paired ventricular premature complexes Right axis deviation Repol abnrm suggests ischemia, anterolateral Prolonged QT interval Baseline wander in lead(s) V3 Confirmed by Towana Sharper [phone]) on 12/08/2023 10:22:52 PM  Procedures Procedures  Medications Ordered in the ED Medications  furosemide  (LASIX ) injection 40 mg (40 mg Intravenous Given 12/08/23 2320)  ALPRAZolam  (XANAX ) tablet 0.5 mg (0.5 mg Oral Given 12/08/23 2319)    Initial Impression and Plan  Patient here with SOB/DOE and leg swelling, has had some heart fluttering, appears to be in pAF on monitor with mostly sinus rhythm during my evaluation but rapid afib on initial EKG. Labs done in triage show unremarkable CBC and BMP. She has a normal troponin x2 but elevated BNP. I personally viewed the images from radiology studies and agree with radiologist interpretation: CXR consistent with CHF. Will give lasix  and plan admission for diuresis. Currently heart rate is well controlled.   ED Course   Clinical  Course as of 12/09/23 0002  Fri Dec 09, 2023  0001 Spoke with Dr. Lawence, who will evaluate for admission. [CS]    Clinical Course User Index [CS] Roselyn Carlin NOVAK, MD     MDM Rules/Calculators/A&P Medical Decision Making Problems Addressed: Acute on chronic congestive heart failure, unspecified heart failure type [facility]): chronic illness or injury with exacerbation, progression, or side effects of treatment Atrial fibrillation, unspecified type [facility]): acute illness or injury  Amount and/or Complexity of Data Reviewed Labs: ordered. Decision-making details documented in ED Course. Radiology: ordered and independent interpretation performed. Decision-making details documented in ED Course. ECG/medicine tests: ordered and independent interpretation performed. Decision-making details documented in ED Course.  Risk Prescription drug  management. Decision regarding hospitalization.     Final Clinical Impression(s) / ED Diagnoses Final diagnoses:  Acute on chronic congestive heart failure, unspecified heart failure type [facility])  Atrial fibrillation, unspecified type [facility])    Rx / DC Orders ED Discharge Orders     None        Roselyn Carlin NOVAK, MD 12/09/23 0002

## 2023-12-09 DIAGNOSIS — I708 Atherosclerosis of other arteries: Secondary | ICD-10-CM | POA: Diagnosis not present

## 2023-12-09 DIAGNOSIS — I2781 Cor pulmonale (chronic): Secondary | ICD-10-CM | POA: Diagnosis not present

## 2023-12-09 DIAGNOSIS — I272 Pulmonary hypertension, unspecified: Secondary | ICD-10-CM | POA: Diagnosis not present

## 2023-12-09 DIAGNOSIS — I25810 Atherosclerosis of coronary artery bypass graft(s) without angina pectoris: Secondary | ICD-10-CM

## 2023-12-09 DIAGNOSIS — J90 Pleural effusion, not elsewhere classified: Secondary | ICD-10-CM | POA: Diagnosis not present

## 2023-12-09 DIAGNOSIS — I5082 Biventricular heart failure: Secondary | ICD-10-CM | POA: Diagnosis not present

## 2023-12-09 DIAGNOSIS — I509 Heart failure, unspecified: Secondary | ICD-10-CM | POA: Diagnosis not present

## 2023-12-09 DIAGNOSIS — F419 Anxiety disorder, unspecified: Secondary | ICD-10-CM | POA: Diagnosis not present

## 2023-12-09 DIAGNOSIS — I700 Atherosclerosis of aorta: Secondary | ICD-10-CM | POA: Diagnosis not present

## 2023-12-09 DIAGNOSIS — R0602 Shortness of breath: Secondary | ICD-10-CM | POA: Diagnosis not present

## 2023-12-09 DIAGNOSIS — E874 Mixed disorder of acid-base balance: Secondary | ICD-10-CM | POA: Diagnosis not present

## 2023-12-09 DIAGNOSIS — I517 Cardiomegaly: Secondary | ICD-10-CM | POA: Diagnosis not present

## 2023-12-09 DIAGNOSIS — Z86711 Personal history of pulmonary embolism: Secondary | ICD-10-CM | POA: Diagnosis not present

## 2023-12-09 DIAGNOSIS — I739 Peripheral vascular disease, unspecified: Secondary | ICD-10-CM | POA: Diagnosis not present

## 2023-12-09 DIAGNOSIS — R0989 Other specified symptoms and signs involving the circulatory and respiratory systems: Secondary | ICD-10-CM | POA: Diagnosis not present

## 2023-12-09 DIAGNOSIS — J811 Chronic pulmonary edema: Secondary | ICD-10-CM | POA: Diagnosis not present

## 2023-12-09 DIAGNOSIS — I2729 Other secondary pulmonary hypertension: Secondary | ICD-10-CM | POA: Diagnosis not present

## 2023-12-09 DIAGNOSIS — E785 Hyperlipidemia, unspecified: Secondary | ICD-10-CM

## 2023-12-09 DIAGNOSIS — Z952 Presence of prosthetic heart valve: Secondary | ICD-10-CM | POA: Diagnosis not present

## 2023-12-09 DIAGNOSIS — J9611 Chronic respiratory failure with hypoxia: Secondary | ICD-10-CM

## 2023-12-09 DIAGNOSIS — I480 Paroxysmal atrial fibrillation: Secondary | ICD-10-CM

## 2023-12-09 DIAGNOSIS — Z7189 Other specified counseling: Secondary | ICD-10-CM | POA: Diagnosis not present

## 2023-12-09 DIAGNOSIS — J9621 Acute and chronic respiratory failure with hypoxia: Secondary | ICD-10-CM | POA: Diagnosis not present

## 2023-12-09 DIAGNOSIS — K219 Gastro-esophageal reflux disease without esophagitis: Secondary | ICD-10-CM

## 2023-12-09 DIAGNOSIS — I50810 Right heart failure, unspecified: Secondary | ICD-10-CM | POA: Diagnosis not present

## 2023-12-09 DIAGNOSIS — I4891 Unspecified atrial fibrillation: Secondary | ICD-10-CM | POA: Insufficient documentation

## 2023-12-09 DIAGNOSIS — E871 Hypo-osmolality and hyponatremia: Secondary | ICD-10-CM | POA: Diagnosis not present

## 2023-12-09 DIAGNOSIS — Z7901 Long term (current) use of anticoagulants: Secondary | ICD-10-CM | POA: Diagnosis not present

## 2023-12-09 DIAGNOSIS — I350 Nonrheumatic aortic (valve) stenosis: Secondary | ICD-10-CM

## 2023-12-09 DIAGNOSIS — I5033 Acute on chronic diastolic (congestive) heart failure: Secondary | ICD-10-CM | POA: Diagnosis present

## 2023-12-09 DIAGNOSIS — R918 Other nonspecific abnormal finding of lung field: Secondary | ICD-10-CM | POA: Diagnosis not present

## 2023-12-09 DIAGNOSIS — Z515 Encounter for palliative care: Secondary | ICD-10-CM | POA: Diagnosis not present

## 2023-12-09 DIAGNOSIS — I110 Hypertensive heart disease with heart failure: Secondary | ICD-10-CM | POA: Diagnosis not present

## 2023-12-09 DIAGNOSIS — I252 Old myocardial infarction: Secondary | ICD-10-CM | POA: Diagnosis not present

## 2023-12-09 DIAGNOSIS — J439 Emphysema, unspecified: Secondary | ICD-10-CM | POA: Diagnosis not present

## 2023-12-09 DIAGNOSIS — Z66 Do not resuscitate: Secondary | ICD-10-CM | POA: Diagnosis not present

## 2023-12-09 DIAGNOSIS — I2510 Atherosclerotic heart disease of native coronary artery without angina pectoris: Secondary | ICD-10-CM | POA: Diagnosis not present

## 2023-12-09 DIAGNOSIS — R251 Tremor, unspecified: Secondary | ICD-10-CM | POA: Diagnosis not present

## 2023-12-09 DIAGNOSIS — J449 Chronic obstructive pulmonary disease, unspecified: Secondary | ICD-10-CM | POA: Diagnosis not present

## 2023-12-09 DIAGNOSIS — J918 Pleural effusion in other conditions classified elsewhere: Secondary | ICD-10-CM | POA: Diagnosis not present

## 2023-12-09 DIAGNOSIS — D638 Anemia in other chronic diseases classified elsewhere: Secondary | ICD-10-CM | POA: Diagnosis not present

## 2023-12-09 DIAGNOSIS — I083 Combined rheumatic disorders of mitral, aortic and tricuspid valves: Secondary | ICD-10-CM | POA: Diagnosis not present

## 2023-12-09 LAB — BASIC METABOLIC PANEL WITH GFR
Anion gap: 4 — ABNORMAL LOW (ref 5–15)
BUN: 10 mg/dL (ref 8–23)
CO2: 42 mmol/L — ABNORMAL HIGH (ref 22–32)
Calcium: 9 mg/dL (ref 8.9–10.3)
Chloride: 93 mmol/L — ABNORMAL LOW (ref 98–111)
Creatinine, Ser: 0.57 mg/dL (ref 0.44–1.00)
GFR, Estimated: >60 mL/min (ref >60)
Glucose, Bld: 112 mg/dL — ABNORMAL HIGH (ref 70–99)
Potassium: 3.6 mmol/L (ref 3.5–5.1)
Sodium: 140 mmol/L (ref 135–145)

## 2023-12-09 LAB — CBC
HCT: 34.8 % — ABNORMAL LOW (ref 36.0–46.0)
Hemoglobin: 10.6 g/dL — ABNORMAL LOW (ref 12.0–15.0)
MCH: 29.4 pg (ref 26.0–34.0)
MCHC: 30.5 g/dL (ref 30.0–36.0)
MCV: 96.7 fL (ref 80.0–100.0)
Platelets: 157 K/uL (ref 150–400)
RBC: 3.6 MIL/uL — ABNORMAL LOW (ref 3.87–5.11)
RDW: 16 % — ABNORMAL HIGH (ref 11.5–15.5)
WBC: 5.7 K/uL (ref 4.0–10.5)
nRBC: 0 % (ref 0.0–0.2)

## 2023-12-09 LAB — MRSA NEXT GEN BY PCR, NASAL: MRSA by PCR Next Gen: DETECTED — AB

## 2023-12-09 MED ORDER — PANTOPRAZOLE SODIUM 40 MG PO TBEC
40.0000 mg | DELAYED_RELEASE_TABLET | Freq: Every day | ORAL | Status: DC
  Administered 2023-12-09 – 2023-12-20 (×12): 40 mg via ORAL
  Filled 2023-12-09 (×12): qty 1

## 2023-12-09 MED ORDER — FUROSEMIDE 10 MG/ML IJ SOLN
40.0000 mg | Freq: Once | INTRAMUSCULAR | Status: AC
  Administered 2023-12-09: 40 mg via INTRAVENOUS
  Filled 2023-12-09: qty 4

## 2023-12-09 MED ORDER — ENOXAPARIN SODIUM 40 MG/0.4ML IJ SOSY
40.0000 mg | PREFILLED_SYRINGE | INTRAMUSCULAR | Status: DC
Start: 2023-12-09 — End: 2023-12-09

## 2023-12-09 MED ORDER — METOPROLOL SUCCINATE ER 25 MG PO TB24
12.5000 mg | ORAL_TABLET | Freq: Every day | ORAL | Status: DC
  Administered 2023-12-09: 12.5 mg via ORAL
  Filled 2023-12-09: qty 1

## 2023-12-09 MED ORDER — ALPRAZOLAM 0.5 MG PO TABS
0.5000 mg | ORAL_TABLET | Freq: Three times a day (TID) | ORAL | Status: DC
  Administered 2023-12-09 – 2023-12-20 (×33): 0.5 mg via ORAL
  Filled 2023-12-09 (×33): qty 1

## 2023-12-09 MED ORDER — FUROSEMIDE 10 MG/ML IJ SOLN
80.0000 mg | Freq: Two times a day (BID) | INTRAMUSCULAR | Status: DC
  Administered 2023-12-09 – 2023-12-11 (×5): 80 mg via INTRAVENOUS
  Filled 2023-12-09 (×7): qty 8

## 2023-12-09 MED ORDER — METOPROLOL TARTRATE 12.5 MG HALF TABLET
12.5000 mg | ORAL_TABLET | Freq: Two times a day (BID) | ORAL | Status: DC
  Administered 2023-12-09 – 2023-12-20 (×22): 12.5 mg via ORAL
  Filled 2023-12-09 (×23): qty 1

## 2023-12-09 MED ORDER — BUDESONIDE 0.25 MG/2ML IN SUSP
0.2500 mg | Freq: Every day | RESPIRATORY_TRACT | Status: DC
  Administered 2023-12-09 – 2023-12-20 (×12): 0.25 mg via RESPIRATORY_TRACT
  Filled 2023-12-09 (×12): qty 2

## 2023-12-09 MED ORDER — ASPIRIN 81 MG PO TBEC: 81.0000 mg | DELAYED_RELEASE_TABLET | Freq: Every day | ORAL | Status: DC

## 2023-12-09 MED ORDER — WARFARIN - PHARMACIST DOSING INPATIENT
Freq: Every day | Status: DC
Start: 2023-12-09 — End: 2023-12-16

## 2023-12-09 MED ORDER — APIXABAN 5 MG PO TABS: 5.0000 mg | ORAL_TABLET | Freq: Two times a day (BID) | ORAL | Status: DC

## 2023-12-09 MED ORDER — OXYCODONE HCL 5 MG PO TABS
5.0000 mg | ORAL_TABLET | Freq: Four times a day (QID) | ORAL | Status: DC | PRN
Start: 2023-12-09 — End: 2023-12-18
  Administered 2023-12-09 – 2023-12-18 (×22): 5 mg via ORAL
  Filled 2023-12-09 (×22): qty 1

## 2023-12-09 MED ORDER — ATORVASTATIN CALCIUM 40 MG PO TABS
40.0000 mg | ORAL_TABLET | Freq: Every day | ORAL | Status: DC
  Administered 2023-12-09 – 2023-12-20 (×12): 40 mg via ORAL
  Filled 2023-12-09 (×12): qty 1

## 2023-12-09 MED ORDER — ENOXAPARIN SODIUM 80 MG/0.8ML IJ SOSY
70.0000 mg | PREFILLED_SYRINGE | Freq: Two times a day (BID) | INTRAMUSCULAR | Status: DC
  Administered 2023-12-09 – 2023-12-10 (×4): 70 mg via SUBCUTANEOUS
  Filled 2023-12-09 (×4): qty 0.8

## 2023-12-09 MED ORDER — ONDANSETRON HCL 4 MG/2ML IJ SOLN: 4.0000 mg | Freq: Four times a day (QID) | INTRAMUSCULAR | Status: DC | PRN

## 2023-12-09 MED ORDER — MAGNESIUM HYDROXIDE 400 MG/5ML PO SUSP: 30.0000 mL | Freq: Every day | ORAL | Status: DC | PRN

## 2023-12-09 MED ORDER — MAGNESIUM OXIDE -MG SUPPLEMENT 400 (240 MG) MG PO TABS
400.0000 mg | ORAL_TABLET | Freq: Every day | ORAL | Status: DC
  Administered 2023-12-09 – 2023-12-20 (×12): 400 mg via ORAL
  Filled 2023-12-09 (×12): qty 1

## 2023-12-09 MED ORDER — ONDANSETRON HCL 4 MG PO TABS: 4.0000 mg | ORAL_TABLET | Freq: Four times a day (QID) | ORAL | Status: DC | PRN

## 2023-12-09 MED ORDER — FUROSEMIDE 10 MG/ML IJ SOLN
40.0000 mg | Freq: Two times a day (BID) | INTRAMUSCULAR | Status: DC
Start: 2023-12-09 — End: 2023-12-09
  Administered 2023-12-09: 40 mg via INTRAVENOUS
  Filled 2023-12-09: qty 4

## 2023-12-09 MED ORDER — POTASSIUM CHLORIDE CRYS ER 20 MEQ PO TBCR
20.0000 meq | EXTENDED_RELEASE_TABLET | Freq: Every morning | ORAL | Status: DC
  Administered 2023-12-09 – 2023-12-11 (×3): 20 meq via ORAL
  Filled 2023-12-09 (×3): qty 1

## 2023-12-09 MED ORDER — ACETAMINOPHEN 650 MG RE SUPP: 650.0000 mg | Freq: Four times a day (QID) | RECTAL | Status: DC | PRN

## 2023-12-09 MED ORDER — ACETAMINOPHEN 325 MG PO TABS
650.0000 mg | ORAL_TABLET | Freq: Four times a day (QID) | ORAL | Status: DC | PRN
  Administered 2023-12-09 – 2023-12-18 (×8): 650 mg via ORAL
  Filled 2023-12-09 (×8): qty 2

## 2023-12-09 MED ORDER — WARFARIN SODIUM 5 MG PO TABS
5.0000 mg | ORAL_TABLET | Freq: Once | ORAL | Status: AC
  Administered 2023-12-09: 5 mg via ORAL
  Filled 2023-12-09: qty 1

## 2023-12-09 MED ORDER — IPRATROPIUM-ALBUTEROL 0.5-2.5 (3) MG/3ML IN SOLN
3.0000 mL | Freq: Four times a day (QID) | RESPIRATORY_TRACT | Status: DC | PRN
  Administered 2023-12-09 – 2023-12-11 (×5): 3 mL via RESPIRATORY_TRACT
  Filled 2023-12-09 (×7): qty 3

## 2023-12-09 MED ORDER — PRIMIDONE 50 MG PO TABS
50.0000 mg | ORAL_TABLET | Freq: Every day | ORAL | Status: DC
  Administered 2023-12-09 – 2023-12-18 (×10): 50 mg via ORAL
  Filled 2023-12-09 (×10): qty 1

## 2023-12-09 MED ORDER — TRAZODONE HCL 50 MG PO TABS
25.0000 mg | ORAL_TABLET | Freq: Every evening | ORAL | Status: DC | PRN
  Administered 2023-12-09 – 2023-12-14 (×6): 25 mg via ORAL
  Filled 2023-12-09 (×6): qty 1

## 2023-12-09 MED ORDER — VITAMIN C 500 MG PO TABS
500.0000 mg | ORAL_TABLET | Freq: Every day | ORAL | Status: DC
  Administered 2023-12-09 – 2023-12-20 (×12): 500 mg via ORAL
  Filled 2023-12-09 (×18): qty 1

## 2023-12-09 NOTE — Consult Note (Addendum)
 CARDIOLOGY CONSULT NOTE    Patient ID: [name]; [id number]; 08-08-1955   Admit date: 12/08/2023 Date of Consult: 12/09/2023  Primary Care Provider: Rosamond Leta NOVAK, MD Primary Cardiologist:  Primary Electrophysiologist:     Patient Profile:   Emily Rocha is a 68 y.o. female with a hx of  who is being seen today for the evaluation of ADHf at the request of Dr Dino.  History of Present Illness:   [name] is a 68 year old F known to have CAD manifested by NSTEMI in 2018 s/p CABG (LIMA to LAD, SVG to LCx, SVG to D1) c/w cardiogenic shock, s/p orbital atherectomy and RCA PCI in 2022, HFimpEF (LVEF 45 to 50% in 2018, improved to normal), severe aortic valve stenosis s/p TAVR in 2022, COPD on home O2, history of lung cancer s/p lobectomy, history of trace subdural hemorrhage in 2021, history of pulm embolism in 2021, paroxysmal atrial fibrillation not on [facility] presented to the ER with DOE, nocturnal cough, abdominal distention, leg swelling and palpitations for the last few weeks.  She tried taking increased doses of p.o. diuretics at home but did not work.  EKG upon arrival to the ER showed A-fib with RVR, HR 138 bpm.  proBNP elevated, 2,590.  Patient is currently admitted to the hospitalist and further management of atrial fibrillation with RVR and acute on chronic diastolic heart failure.  She received IV Lasix  40 mg in the ER yesterday.  Currently on IV Lasix  40 mg twice daily.  She reported that she had adequate urine output last night but not this morning.  Patient did have atrial fibrillation with RVR during prior hospitalizations when she was admitted for pneumonia, now acute on chronic diastolic heart failure excetra.  She was not started on systemic anticoagulation due to history of traumatic subdural hemorrhage in 2021.  She also had pulmonary embolism at that time.  Past Medical History:  Diagnosis Date   Anxiety    Asthma    COPD (chronic obstructive pulmonary disease) (HCC)     Coronary artery disease    a. s/p NSTEMI in 11/2016 and required CABG with LIMA-LAD, SVG-LCx and SVG-D1 and complicated by cardiogenic shock b. cath in 05/2020 showing severe RCA stenosis and treated with orbital atherectomy and stent placement   GERD (gastroesophageal reflux disease)    History of CVA (cerebrovascular accident)    History of lung cancer 2020   Peripheral vascular disease    S/P TAVR (transcatheter aortic valve replacement) 08/12/2020   s/p TAVR with a 23 mm Edwards S3U via the TF approach by Dr. Verlin & Dr. Lucas   Severe aortic stenosis     Past Surgical History:  Procedure Laterality Date   AORTIC ARCH ANGIOGRAPHY N/A 11/04/2017   Procedure: AORTIC ARCH ANGIOGRAPHY;  Surgeon: Harvey Carlin BRAVO, MD;  [facility];  Service: Cardiovascular;  Laterality: N/A;   APPLICATION OF WOUND VAC N/A 01/06/2018   Procedure: APPLICATION OF WOUND VAC;  Surgeon: Fleeta Hanford Coy, MD;  Location: [facility] OR;  Service: Thoracic;  Laterality: N/A;   APPLICATION OF WOUND VAC N/A 01/13/2018   Procedure: WOUND VAC CHANGE;  Surgeon: Fleeta Hanford Coy, MD;  Location: [facility] OR;  Service: Thoracic;  Laterality: N/A;   CARDIAC CATHETERIZATION     CAROTID-SUBCLAVIAN BYPASS GRAFT Right 12/26/2017   Procedure: AORTIC TO RIGHT COMMON CAROTID AND RIGHT SUBCLAVIAN  ARTERY  BYPASS;  Surgeon: Harvey Carlin BRAVO, MD;  Location: [facility] OR;  Service: Vascular;  Laterality: Right;   CHOLECYSTECTOMY     CLOSURE OF DIAPHRAGM  12/26/2017   Procedure: REPAIR OF DIAPHRAGM;  Surgeon: Fleeta Hanford Coy, MD;  Location: [facility] OR;  Service: Thoracic;;   COLONOSCOPY N/A 10/03/2014   Procedure: COLONOSCOPY;  Surgeon: Claudis RAYMOND Rivet, MD;  [facility]: [facility];  Service: Endoscopy;  Laterality: N/A;  730   CORONARY ARTERY BYPASS GRAFT N/A 12/14/2016   Procedure: CORONARY ARTERY BYPASS GRAFTING (CABG) x three , using left internal mammary artery and right leg greater saphenous vein harvested endoscopically;  Surgeon:  Fleeta Hanford Coy, MD;  Location: [facility] OR;  Service: Open Heart Surgery;  Laterality: N/A;   CORONARY ATHERECTOMY N/A 06/11/2020   Procedure: CORONARY ATHERECTOMY;  Surgeon: Verlin Lonni BIRCH, MD;  Location: [facility];  Service: Cardiovascular;  Laterality: N/A;   CORONARY IMAGING/OCT N/A 06/11/2020   Procedure: INTRAVASCULAR IMAGING/OCT;  Surgeon: Verlin Lonni BIRCH, MD;  Location: [facility];  Service: Cardiovascular;  Laterality: N/A;   CORONARY PRESSURE/FFR STUDY N/A 06/04/2020   Procedure: INTRAVASCULAR PRESSURE WIRE/FFR STUDY;  Surgeon: Verlin Lonni BIRCH, MD;  Location: [facility];  Service: Cardiovascular;  Laterality: N/A;   CORONARY STENT INTERVENTION N/A 06/11/2020   Procedure: CORONARY STENT INTERVENTION;  Surgeon: Verlin Lonni BIRCH, MD;  [facility]: [facility];  Service: Cardiovascular;  Laterality: N/A;   ENDARTERECTOMY Right 12/06/2014   Procedure: ENDARTERECTOMY CAROTid;  Surgeon: Carlin FORBES Haddock, MD;  Location: [facility] OR;  Service: Vascular;  Laterality: Right;   ESOPHAGOGASTRODUODENOSCOPY N/A 12/03/2016   Procedure: ESOPHAGOGASTRODUODENOSCOPY (EGD);  Surgeon: Rivet Claudis RAYMOND, MD;  [facility];  Service: Endoscopy;  Laterality: N/A;  7:30   LEFT HEART CATH AND CORONARY ANGIOGRAPHY N/A 12/14/2016   Procedure: LEFT HEART CATH AND CORONARY ANGIOGRAPHY;  Surgeon: Verlin Lonni BIRCH, MD;  [facility];  Service: Cardiovascular;  Laterality: N/A;   LOBECTOMY Right 10/26/2018   Procedure: RIGHT UPPER LOBECTOMY;  Surgeon: Shyrl Linnie KIDD, MD;  Location: [facility] OR;  Service: Thoracic;  Laterality: Right;   PERIPHERAL VASCULAR CATHETERIZATION N/A 12/05/2014   Procedure:  Carotid Arch Angiography;  Surgeon: Redell LITTIE Door, MD;  [facility]: [facility];  Service: Cardiovascular;  Laterality: N/A;   RIGHT/LEFT HEART CATH AND CORONARY/GRAFT ANGIOGRAPHY N/A 06/04/2020   Procedure: RIGHT/LEFT HEART CATH AND  CORONARY/GRAFT ANGIOGRAPHY;  Surgeon: Verlin Lonni BIRCH, MD;  [facility];  Service: Cardiovascular;  Laterality: N/A;   STERNAL WOUND DEBRIDEMENT N/A 01/06/2018   Procedure: STERNAL WOUND DEBRIDEMENT;  Surgeon: Fleeta Hanford Coy, MD;  Location: [facility] OR;  Service: Thoracic;  Laterality: N/A;   STERNOTOMY N/A 12/26/2017   Procedure: REDO STERNOTOMY;  Surgeon: Fleeta Hanford, Coy, MD;  Location: [facility] OR;  Service: Thoracic;  Laterality: N/A;   TEE WITHOUT CARDIOVERSION N/A 12/14/2016   Procedure: TRANSESOPHAGEAL ECHOCARDIOGRAM (TEE);  Surgeon: Fleeta Hanford, Coy, MD;  Location: [facility] OR;  Service: Open Heart Surgery;  Laterality: N/A;   TEE WITHOUT CARDIOVERSION N/A 08/12/2020   Procedure: TRANSESOPHAGEAL ECHOCARDIOGRAM (TEE);  Surgeon: Verlin Lonni BIRCH, MD;  Location: [facility] INVASIVE CV LAB;  Service: Open Heart Surgery;  Laterality: N/A;   TRANSCATHETER AORTIC VALVE REPLACEMENT, TRANSFEMORAL N/A 08/12/2020   Procedure: TRANSCATHETER AORTIC VALVE REPLACEMENT, TRANSFEMORAL;  Surgeon: Verlin Lonni BIRCH, MD;  [facility];  Service: Open Heart Surgery;  Laterality: N/A;   VIDEO ASSISTED THORACOSCOPY (VATS)/WEDGE RESECTION Right 10/26/2018   Procedure: VIDEO ASSISTED THORACOSCOPY (VATS)/WEDGE RESECTION;  Surgeon: Shyrl Linnie KIDD,  MD;  Location: [facility] OR;  Service: Thoracic;  Laterality: Right;   VIDEO BRONCHOSCOPY N/A 10/26/2018   Procedure: VIDEO BRONCHOSCOPY;  Surgeon: Shyrl Linnie KIDD, MD;  Location: [facility] OR;  Service: Thoracic;  Laterality: N/A;       Inpatient Medications: Scheduled Meds:  ALPRAZolam   0.5 mg Oral TID   ascorbic acid  500 mg Oral Daily   atorvastatin   40 mg Oral Daily   budesonide   0.25 mg Nebulization Daily   enoxaparin  (LOVENOX ) injection  1 mg/kg Subcutaneous Q12H   furosemide   40 mg Intravenous Once   furosemide   80 mg Intravenous Q12H   magnesium  oxide  400 mg Oral Daily   metoprolol  succinate  12.5 mg Oral Daily   pantoprazole   40  mg Oral Daily   potassium chloride  SA  20 mEq Oral q AM   primidone  50 mg Oral Daily   Continuous Infusions:  PRN Meds: acetaminophen  **OR** acetaminophen , ipratropium-albuterol , magnesium  hydroxide, ondansetron  **OR** ondansetron  (ZOFRAN ) IV, oxyCODONE , traZODone  Allergies:    Allergies  Allergen Reactions   Levaquin  [Levofloxacin ] Swelling    Social History:   Social History   Socioeconomic History   Marital status: Married    Spouse name: Not on file   Number of children: Not on file   Years of education: Not on file   Highest education level: Not on file  Occupational History   Not on file  Tobacco Use   Smoking status: Former    Current packs/day: 0.00    Types: Cigarettes    Start date: 03/28/1982    Quit date: 08/05/2023    Years since quitting: 0.3   Smokeless tobacco: Never   Tobacco comments:    Patient floretta again smoking August 05, 2023.   Vaping Use   Vaping status: Former   Start date: 10/23/2013   Quit date: 10/24/2015  Substance and Sexual Activity   Alcohol  use: No    Alcohol /week: 0.0 standard drinks of alcohol    Drug use: No   Sexual activity: Not on file  Other Topics Concern   Not on file  Social History Narrative   Not on file   Social Drivers of Health   Financial Resource Strain: Not on file  Food Insecurity: No Food Insecurity (12/09/2023)   Hunger Vital Sign    Worried About Running Out of Food in the Last Year: Never true    Ran Out of Food in the Last Year: Never true  Transportation Needs: No Transportation Needs (12/09/2023)   PRAPARE - Administrator, Civil Service (Medical): No    Lack of Transportation (Non-Medical): No  Physical Activity: Not on file  Stress: Not on file  Social Connections: Socially Integrated (12/09/2023)   Social Connection and Isolation Panel    Frequency of Communication with Friends and Family: Twice a week    Frequency of Social Gatherings with Friends and Family: Twice a week    Attends  Religious Services: 1 to 4 times per year    Active Member of [employer] or Organizations: Yes    Attends Banker Meetings: 1 to 4 times per year    Marital Status: Married  Catering manager Violence: Not At Risk (12/09/2023)   Humiliation, Afraid, Rape, and Kick questionnaire    Fear of Current or Ex-Partner: No    Emotionally Abused: No    Physically Abused: No    Sexually Abused: No    Family History:    Family History  Problem  Relation Age of Onset   Heart attack Mother    Cancer Mother    Diabetes Mother    Heart disease Mother    Heart attack Father    Heart disease Father      ROS:  Please see the history of present illness.  ROS  All other ROS reviewed and negative.     Physical Exam/Data:   Vitals:   12/09/23 0746 12/09/23 0839 12/09/23 0847 12/09/23 1130  BP:  (!) 89/76 111/65   Pulse:  97    Resp:  20    Temp:  98.2 F (36.8 C)    TempSrc:  Oral    SpO2: 94% 90%  94%  Weight:  66.8 kg    Height:  5' 4 (1.626 m)      Intake/Output Summary (Last 24 hours) at 12/09/2023 1134 Last data filed at 12/09/2023 0244 Gross per 24 hour  Intake --  Output 900 ml  Net -900 ml   Filed Weights   12/08/23 1742 12/09/23 0839  Weight: 65.7 kg 66.8 kg   Body mass index is 25.27 kg/m.  General:  Well nourished, well developed, in no acute distress HEENT: normal Lymph: no adenopathy Neck: JVD+ Endocrine:  No thryomegaly Vascular: No carotid bruits; FA pulses 2+ bilaterally without bruits  Cardiac:  normal S1, S2; irregular rate and rhythm; no murmur  Lungs:  clear to auscultation bilaterally, no wheezing, rhonchi or rales  Abd: soft, nontender, no hepatomegaly  Ext: 2+ pitting edema Musculoskeletal:  No deformities, BUE and BLE strength normal and equal Skin: warm and dry  Neuro:  CNs 2-12 intact, no focal abnormalities noted Psych:  Normal affect    Laboratory Data:  Chemistry Recent Labs  Lab 12/08/23 1808 12/09/23 0459  NA 139 140  K  4.1 3.6  CL 93* 93*  CO2 40* 42*  GLUCOSE 95 112*  BUN 12 10  CREATININE 0.54 0.57  CALCIUM  9.2 9.0  GFRNONAA >60 >60  ANIONGAP 6 4*    No results for input(s): PROT, ALBUMIN , AST, ALT, ALKPHOS, BILITOT in the last 168 hours. Hematology Recent Labs  Lab 12/08/23 1808 12/09/23 0459  WBC 5.8 5.7  RBC 3.86* 3.60*  HGB 11.2* 10.6*  HCT 37.8 34.8*  MCV 97.9 96.7  MCH 29.0 29.4  MCHC 29.6* 30.5  RDW 15.9* 16.0*  PLT 181 157   Cardiac EnzymesNo results for input(s): TROPONINI in the last 168 hours. No results for input(s): TROPIPOC in the last 168 hours.  BNP Recent Labs  Lab 12/08/23 1808  PROBNP 2,590.0*    DDimer No results for input(s): DDIMER in the last 168 hours.  Radiology/Studies:  DG Chest 2 View Result Date: 12/08/2023 CLINICAL DATA:  Shortness of breath. EXAM: CHEST - 2 VIEW COMPARISON:  Chest CT dated 10/06/2023. FINDINGS: Cardiomegaly with vascular congestion and edema. Small right pleural effusion and right lung base atelectasis or infiltrate, increased since the prior radiograph. No pneumothorax. Median sternotomy wires and aortic valve repair. Atherosclerotic calcification of the aorta. No acute osseous pathology. IMPRESSION: 1. Cardiomegaly with vascular congestion and edema. 2. Small right pleural effusion and right lung base atelectasis or infiltrate. Electronically Signed   By: Vanetta Chou M.D.   On: 12/08/2023 18:37    Assessment and Plan:   Acute on chronic diastolic heart failure - Presented with DOE, nocturnal cough, abdominal distention, pitting edema in the legs for the last few weeks.  No orthopnea or PND reported. - proBNP elevated, 2,590. - On  IV Lasix  40 mg twice daily, increase to 80 mg twice daily. - Prior echocardiogram from 2024 showed normal LVEF, moderate RV systolic dysfunction, moderate enlargement of RV, CVP 15 mmHg. - Echocardiogram pending.  RV failure History of pulm embolism in 2021 - Prior echo from 2024  revealed moderate RV systolic dysfunction and moderate RV enlargement.  Patient had pulm embolism in 2021, initially started on Eliquis  but later had to be discontinued within 1 week due to incidental finding of subdural hemorrhage (based on recommendations from neurosurgery NP) with plans to follow-up in neurosurgery office but I did not find any note that she was officially evaluated by them to resume Eliquis . - She will need VQ scan either inpatient or in the outpatient setting, to r/o CTEPH.  Atrial fibrillation with RVR (Tele, Afib, HR 90-100S) - Prior onset in the setting of pneumonia but systemic AC was not started due to one-time occurrence.  Now she is again in A-fib with RVR.  Due to underlying COPD, she is at a higher risk of recurrent A-fib.  Strongly recommend starting systemic AC due to higher CHA2DS2-VASc score.  I reviewed the CT head without contrast from 2021 that showed 4 mm subdural hemorrhage, repeat imaging showed trace hemorrhage and the most recent imaging from 2024 did not reveal any evidence of subdural hemorrhage.  Okay to start systemic AC. - Soft blood pressures, switch metoprolol  succinate to tartrate 12.5 mg twice daily. - Currently on primidone.  DOACs contraindicated.  Start warfarin, pharmacy consulted.  Bridged with subcutaneous Lovenox  1 mg/kg twice daily.  Goal INR between 2 and 3. - If he is deemed to be a fall risk, she will need to be referred to electrophysiology for implantation of left atrial appendage occluder and concomitant A-fib ablation.  Will continue to follow.  CAD s/p CABG in 2018., s/p RCA PCI in 2022 - No need to be on aspirin , will start Coumadin  as above.  Continue atorvastatin  40 mg nightly.  Echocardiogram is pending.  Severe aortic valve stenosis s/p TAVR in 2022 - Echocardiogram is pending. - SBE prophylaxis prior dental procedures  COPD on home O2 - Per primary team.   75 minutes spent in reviewing the prior records, more than 3 labs,  discussion of the above problems with the patient and documentation.   For questions or updates, please contact [facility] Please consult [email or web address] for contact info under Cardiology/STEMI.   Signed, Mukund Weinreb Priya Farzana Koci, MD 12/09/2023 11:34 AM

## 2023-12-09 NOTE — Assessment & Plan Note (Addendum)
 Continue rate control with metoprolol .  Has been on warfarin due to interaction with primidone.  Apparently her tremors have been not improved with primidone, she is ok in discontinue this and use apixaban  for anticoagulation.  Continue with IV heparin  in preparation for cardiac catheterization.

## 2023-12-09 NOTE — Progress Notes (Signed)
 PHARMACY - ANTICOAGULATION CONSULT NOTE  Pharmacy Consult for warfarin Indication: atrial fibrillation  Allergies  Allergen Reactions   Levaquin  [Levofloxacin ] Swelling    Patient Measurements: Height: 5' 4 (162.6 cm) Weight: 66.8 kg (147 lb 3.2 oz) IBW/kg (Calculated) : 54.7 HEPARIN  DW (KG): 66.8  Vital Signs: Temp: 98.2 F (36.8 C) (10/24 0839) Temp Source: Oral (10/24 0839) BP: 111/65 (10/24 0847) Pulse Rate: 97 (10/24 0839)  Labs: Recent Labs    12/08/23 1808 12/09/23 0459  HGB 11.2* 10.6*  HCT 37.8 34.8*  PLT 181 157  CREATININE 0.54 0.57    Estimated Creatinine Clearance: 61.5 mL/min (by C-G formula based on SCr of 0.57 mg/dL).   Medical History: Past Medical History:  Diagnosis Date   Anxiety    Asthma    COPD (chronic obstructive pulmonary disease) (HCC)    Coronary artery disease    a. s/p NSTEMI in 11/2016 and required CABG with LIMA-LAD, SVG-LCx and SVG-D1 and complicated by cardiogenic shock b. cath in 05/2020 showing severe RCA stenosis and treated with orbital atherectomy and stent placement   GERD (gastroesophageal reflux disease)    History of CVA (cerebrovascular accident)    History of lung cancer 2020   Peripheral vascular disease    S/P TAVR (transcatheter aortic valve replacement) 08/12/2020   s/p TAVR with a 23 mm Edwards S3U via the TF approach by Dr. Verlin & Dr. Lucas   Severe aortic stenosis     Medications:  Medications Prior to Admission  Medication Sig Dispense Refill Last Dose/Taking   albuterol  (PROVENTIL ) (2.5 MG/3ML) 0.083% nebulizer solution Take 2.5 mg by nebulization every 4 (four) hours as needed for wheezing or shortness of breath.   12/08/2023 Morning   albuterol  (VENTOLIN  HFA) 108 (90 Base) MCG/ACT inhaler Inhale 1-2 puffs into the lungs every 6 (six) hours as needed for wheezing or shortness of breath.   12/08/2023 Noon   ALPRAZolam  (XANAX ) 0.5 MG tablet Take 0.5 mg by mouth 3 (three) times daily.   12/08/2023  Evening   Ascorbic Acid (VITAMIN C GUMMIE PO) Take 400 mg by mouth daily.   12/08/2023 Morning   aspirin  EC 81 MG tablet Take 81 mg by mouth daily. Swallow whole.   12/08/2023 Bedtime   atorvastatin  (LIPITOR ) 40 MG tablet Take 1 tablet (40 mg total) daily by mouth. 30 tablet 1 12/08/2023 Morning   azithromycin  (ZITHROMAX ) 250 MG tablet Take 1 tablet (250 mg total) by mouth 3 (three) times a week. 36 tablet 6 Past Week   diphenhydramine-acetaminophen  (TYLENOL  PM) 25-500 MG TABS tablet Take 2 tablets by mouth at bedtime.   12/08/2023 Bedtime   Fluticasone-Umeclidin-Vilant (TRELEGY ELLIPTA) 100-62.5-25 MCG/ACT AEPB Inhale 1 Inhalation into the lungs daily. 3 each 6 12/08/2023 Morning   ibuprofen (ADVIL) 200 MG tablet Take 400 mg by mouth every 6 (six) hours as needed for moderate pain (pain score 4-6).   12/08/2023 Morning   ipratropium-albuterol  (DUONEB) 0.5-2.5 (3) MG/3ML SOLN One in am and one at supper time 180 mL 11 Past Week   magnesium  oxide (MAG-OX) 400 (240 Mg) MG tablet Take 400 mg by mouth daily.   12/08/2023 Morning   metoprolol  succinate (TOPROL -XL) 25 MG 24 hr tablet TAKE 0.5 TABLETS (12.5 MG TOTAL) BY MOUTH DAILY 45 tablet 3 12/08/2023 Morning   pantoprazole  (PROTONIX ) 40 MG tablet Take 40 mg by mouth daily.   12/08/2023 Morning   potassium chloride  SA (KLOR-CON ) 20 MEQ tablet Take 20 mEq by mouth in the morning.   12/08/2023  Morning   torsemide  (DEMADEX ) 20 MG tablet Take 40 mg by mouth as needed (based on her weight).   12/08/2023 Morning   TRELEGY ELLIPTA 100-62.5-25 MCG/ACT AEPB Inhale 1 puff into the lungs daily.   12/08/2023 Morning   budesonide  (PULMICORT ) 0.25 MG/2ML nebulizer solution One vial with duonbe twice daily (Patient not taking: Reported on 12/09/2023) 120 mL 12 Not Taking   primidone (MYSOLINE) 50 MG tablet Take 50 mg by mouth daily.       Assessment: Pharmacy consulted to dose warfarin in patient with atrial fibrillation.  Patient is primidone prior to admission  which is contraindicated with Eliquis - could consider Eliquis  in future if patient stops taking primidone. Will bridge with lovenox  until INR therapeutic.  Goal of Therapy:  INR 2-3 Monitor platelets by anticoagulation protocol: Yes   Plan:  Warfarin 5 mg x 1 dose Lovenox  1 mg/kg every 12 hours until INR therapeutic. Monitor daily INR and s/s of bleeding.  Elspeth Sour, PharmD Clinical Pharmacist 12/09/2023 1:14 PM

## 2023-12-09 NOTE — H&P (Addendum)
 [facility]   PATIENT NAME: Emily Rocha    MR#:  [id number]  DATE OF BIRTH:  [birth year]  DATE OF ADMISSION:  12/08/2023  PRIMARY CARE PHYSICIAN: Rosamond Leta NOVAK, MD   Patient is coming from: Home   REQUESTING/REFERRING PHYSICIAN: Roselyn Dunnings, MD.  CHIEF COMPLAINT:   Chief Complaint  Patient presents with   Irregular Heart Beat    HISTORY OF PRESENT ILLNESS:  Emily Rocha is a 68 y.o. Caucasian female with medical history significant for COPD, asthma, anxiety, coronary artery disease status post CABG, peripheral vascular disease and TAVR, who presented to the emergency room with acute onset of palpitations, without chest pain.  She described it as a feeling of heart fluttering.  She admits to dyspnea on exertion without orthopnea or paroxysmal nocturnal dyspnea.  She denied any cough or wheezing.  No fever or chills.  No nausea or vomiting or abdominal pain.  No dysuria, oliguria or hematuria or flank pain.  ED Course: Upon presentation to the emergency room, BP was 136/120 with heart rate 102 and respiratory to 24 pulse oximetry 94% on room air.  Labs revealed CO2 of 40 with chloride 93 and otherwise unremarkable BMP.  proBNP was 2590 and high sensitive troponin I was 19 and later 17.  CBC showed hemoglobin 11.2 hematocrit 37.8. EKG as reviewed by me : EKG showed atrial fibrillation with rapid ventricular sponsor 138, right axis deviation and prolonged QT interval with QTc 511 MS.. Imaging: 2 view chest x-ray showed cardiomegaly with vascular congestion and edema as well as small right pleural effusion and right lung base atelectasis/infiltrate.  The patient was given 40 mg of IV Lasix .  She will be admitted to a medical telemetry bed for further evaluation and management. PAST MEDICAL HISTORY:   Past Medical History:  Diagnosis Date   Anxiety    Asthma    COPD (chronic obstructive pulmonary disease) (HCC)    Coronary artery disease    a. s/p NSTEMI in 11/2016 and  required CABG with LIMA-LAD, SVG-LCx and SVG-D1 and complicated by cardiogenic shock b. cath in 05/2020 showing severe RCA stenosis and treated with orbital atherectomy and stent placement   GERD (gastroesophageal reflux disease)    History of CVA (cerebrovascular accident)    History of lung cancer 2020   Peripheral vascular disease    S/P TAVR (transcatheter aortic valve replacement) 08/12/2020   s/p TAVR with a 23 mm Edwards S3U via the TF approach by Dr. Verlin & Dr. Lucas   Severe aortic stenosis     PAST SURGICAL HISTORY:   Past Surgical History:  Procedure Laterality Date   AORTIC ARCH ANGIOGRAPHY N/A 11/04/2017   Procedure: AORTIC ARCH ANGIOGRAPHY;  Surgeon: Harvey Dunnings BRAVO, MD;  [facility]: [facility];  Service: Cardiovascular;  Laterality: N/A;   APPLICATION OF WOUND VAC N/A 01/06/2018   Procedure: APPLICATION OF WOUND VAC;  Surgeon: Fleeta Hanford Coy, MD;  Location: [facility] OR;  Service: Thoracic;  Laterality: N/A;   APPLICATION OF WOUND VAC N/A 01/13/2018   Procedure: WOUND VAC CHANGE;  Surgeon: Fleeta Hanford Coy, MD;  Location: [facility] OR;  Service: Thoracic;  Laterality: N/A;   CARDIAC CATHETERIZATION     CAROTID-SUBCLAVIAN BYPASS GRAFT Right 12/26/2017   Procedure: AORTIC TO RIGHT COMMON CAROTID AND RIGHT SUBCLAVIAN  ARTERY  BYPASS;  Surgeon: Harvey Dunnings BRAVO, MD;  Location: [facility] OR;  Service: Vascular;  Laterality: Right;   CHOLECYSTECTOMY     CLOSURE OF DIAPHRAGM  12/26/2017   Procedure: REPAIR OF DIAPHRAGM;  Surgeon: Fleeta Hanford Coy, MD;  Location: [facility] OR;  Service: Thoracic;;   COLONOSCOPY N/A 10/03/2014   Procedure: COLONOSCOPY;  Surgeon: Claudis RAYMOND Rivet, MD;  [facility];  Service: Endoscopy;  Laterality: N/A;  730   CORONARY ARTERY BYPASS GRAFT N/A 12/14/2016   Procedure: CORONARY ARTERY BYPASS GRAFTING (CABG) x three , using left internal mammary artery and right leg greater saphenous vein harvested endoscopically;  Surgeon: Fleeta Hanford Coy, MD;  Location:  [facility] OR;  Service: Open Heart Surgery;  Laterality: N/A;   CORONARY ATHERECTOMY N/A 06/11/2020   Procedure: CORONARY ATHERECTOMY;  Surgeon: Verlin Lonni BIRCH, MD;  Location: [facility];  Service: Cardiovascular;  Laterality: N/A;   CORONARY IMAGING/OCT N/A 06/11/2020   Procedure: INTRAVASCULAR IMAGING/OCT;  Surgeon: Verlin Lonni BIRCH, MD;  Location: [facility];  Service: Cardiovascular;  Laterality: N/A;   CORONARY PRESSURE/FFR STUDY N/A 06/04/2020   Procedure: INTRAVASCULAR PRESSURE WIRE/FFR STUDY;  Surgeon: Verlin Lonni BIRCH, MD;  [facility]: [facility];  Service: Cardiovascular;  Laterality: N/A;   CORONARY STENT INTERVENTION N/A 06/11/2020   Procedure: CORONARY STENT INTERVENTION;  Surgeon: Verlin Lonni BIRCH, MD;  [facility];  Service: Cardiovascular;  Laterality: N/A;   ENDARTERECTOMY Right 12/06/2014   Procedure: ENDARTERECTOMY CAROTid;  Surgeon: Carlin FORBES Haddock, MD;  Location: [facility] OR;  Service: Vascular;  Laterality: Right;   ESOPHAGOGASTRODUODENOSCOPY N/A 12/03/2016   Procedure: ESOPHAGOGASTRODUODENOSCOPY (EGD);  Surgeon: Rivet Claudis RAYMOND, MD;  [facility];  Service: Endoscopy;  Laterality: N/A;  7:30   LEFT HEART CATH AND CORONARY ANGIOGRAPHY N/A 12/14/2016   Procedure: LEFT HEART CATH AND CORONARY ANGIOGRAPHY;  Surgeon: Verlin Lonni BIRCH, MD;  Location: [facility];  Service: Cardiovascular;  Laterality: N/A;   LOBECTOMY Right 10/26/2018   Procedure: RIGHT UPPER LOBECTOMY;  Surgeon: Shyrl Linnie KIDD, MD;  Location: [facility] OR;  Service: Thoracic;  Laterality: Right;   PERIPHERAL VASCULAR CATHETERIZATION N/A 12/05/2014   Procedure:  Carotid Arch Angiography;  Surgeon: Redell LITTIE Door, MD;  [facility]: [facility];  Service: Cardiovascular;  Laterality: N/A;   RIGHT/LEFT HEART CATH AND CORONARY/GRAFT ANGIOGRAPHY N/A 06/04/2020   Procedure: RIGHT/LEFT HEART CATH AND CORONARY/GRAFT ANGIOGRAPHY;  Surgeon:  Verlin Lonni BIRCH, MD;  [facility]: [facility];  Service: Cardiovascular;  Laterality: N/A;   STERNAL WOUND DEBRIDEMENT N/A 01/06/2018   Procedure: STERNAL WOUND DEBRIDEMENT;  Surgeon: Fleeta Hanford Coy, MD;  Location: [facility] OR;  Service: Thoracic;  Laterality: N/A;   STERNOTOMY N/A 12/26/2017   Procedure: REDO STERNOTOMY;  Surgeon: Fleeta Hanford, Coy, MD;  Location: [facility] OR;  Service: Thoracic;  Laterality: N/A;   TEE WITHOUT CARDIOVERSION N/A 12/14/2016   Procedure: TRANSESOPHAGEAL ECHOCARDIOGRAM (TEE);  Surgeon: Fleeta Hanford, Coy, MD;  Location: [facility] OR;  Service: Open Heart Surgery;  Laterality: N/A;   TEE WITHOUT CARDIOVERSION N/A 08/12/2020   Procedure: TRANSESOPHAGEAL ECHOCARDIOGRAM (TEE);  Surgeon: Verlin Lonni BIRCH, MD;  Location: [facility] INVASIVE CV LAB;  Service: Open Heart Surgery;  Laterality: N/A;   TRANSCATHETER AORTIC VALVE REPLACEMENT, TRANSFEMORAL N/A 08/12/2020   Procedure: TRANSCATHETER AORTIC VALVE REPLACEMENT, TRANSFEMORAL;  Surgeon: Verlin Lonni BIRCH, MD;  [facility];  Service: Open Heart Surgery;  Laterality: N/A;   VIDEO ASSISTED THORACOSCOPY (VATS)/WEDGE RESECTION Right 10/26/2018   Procedure: VIDEO ASSISTED THORACOSCOPY (VATS)/WEDGE RESECTION;  Surgeon: Shyrl Linnie KIDD, MD;  Location: [facility] OR;  Service: Thoracic;  Laterality: Right;  VIDEO BRONCHOSCOPY N/A 10/26/2018   Procedure: VIDEO BRONCHOSCOPY;  Surgeon: Shyrl Linnie KIDD, MD;  Location: [facility] OR;  Service: Thoracic;  Laterality: N/A;    SOCIAL HISTORY:   Social History   Tobacco Use   Smoking status: Former    Current packs/day: 0.00    Types: Cigarettes    Start date: 03/28/1982    Quit date: 08/05/2023    Years since quitting: 0.3   Smokeless tobacco: Never   Tobacco comments:    Patient floretta again smoking August 05, 2023.   Substance Use Topics   Alcohol  use: No    Alcohol /week: 0.0 standard drinks of alcohol     FAMILY HISTORY:   Family History  Problem Relation Age of  Onset   Heart attack Mother    Cancer Mother    Diabetes Mother    Heart disease Mother    Heart attack Father    Heart disease Father     DRUG ALLERGIES:   Allergies  Allergen Reactions   Levaquin  [Levofloxacin ] Swelling    REVIEW OF SYSTEMS:   ROS As per history of present illness. All pertinent systems were reviewed above. Constitutional, HEENT, cardiovascular, respiratory, GI, GU, musculoskeletal, neuro, psychiatric, endocrine, integumentary and hematologic systems were reviewed and are otherwise negative/unremarkable except for positive findings mentioned above in the HPI.   MEDICATIONS AT HOME:   Prior to Admission medications   Medication Sig Start Date End Date Taking? Authorizing Provider  albuterol  (PROVENTIL ) (2.5 MG/3ML) 0.083% nebulizer solution Take 2.5 mg by nebulization every 4 (four) hours as needed for wheezing or shortness of breath. 03/30/19   [provider]  albuterol  (VENTOLIN  HFA) 108 (90 Base) MCG/ACT inhaler Inhale 1-2 puffs into the lungs every 6 (six) hours as needed for wheezing or shortness of breath.    [provider]  ALPRAZolam  (XANAX ) 0.5 MG tablet Take 0.5 mg by mouth 3 (three) times daily.    [provider]  Ascorbic Acid (VITAMIN C GUMMIE PO) Take 400 mg by mouth daily.    [provider]  aspirin  EC 81 MG tablet Take 81 mg by mouth daily. Swallow whole.    [provider]  atorvastatin  (LIPITOR ) 40 MG tablet Take 1 tablet (40 mg total) daily by mouth. 12/27/16   Gold, Lemond BRAVO, PA-C  azithromycin  (ZITHROMAX ) 250 MG tablet Take 1 tablet (250 mg total) by mouth 3 (three) times a week. 11/14/23   Alghanim, Paula, MD  budesonide  (PULMICORT ) 0.25 MG/2ML nebulizer solution One vial with duonbe twice daily 10/06/23   Wert, Michael B, MD  diphenhydramine-acetaminophen  (TYLENOL  PM) 25-500 MG TABS tablet Take 2 tablets by mouth at bedtime.    [provider]  Fluticasone-Umeclidin-Vilant (TRELEGY  ELLIPTA) 100-62.5-25 MCG/ACT AEPB Inhale 1 Inhalation into the lungs daily. 11/14/23   Alghanim, Paula, MD  ipratropium-albuterol  (DUONEB) 0.5-2.5 (3) MG/3ML SOLN One in am and one at supper time 10/06/23   Darlean Ozell NOVAK, MD  magnesium  oxide (MAG-OX) 400 (240 Mg) MG tablet Take 400 mg by mouth daily.    [provider]  metoprolol  succinate (TOPROL -XL) 25 MG 24 hr tablet TAKE 0.5 TABLETS (12.5 MG TOTAL) BY MOUTH DAILY 11/24/22   Miriam Norris, NP  pantoprazole  (PROTONIX ) 40 MG tablet Take 40 mg by mouth daily.    [provider]  potassium chloride  SA (KLOR-CON ) 20 MEQ tablet Take 20 mEq by mouth in the morning.    [provider]  primidone (MYSOLINE) 50 MG tablet Take 50 mg by mouth  daily. 09/26/23   [provider]  sodium chloride  HYPERTONIC 3 % nebulizer solution Take by nebulization 2 (two) times daily. 11/22/23   Alghanim, Paula, MD  torsemide  (DEMADEX ) 20 MG tablet Take 40 mg by mouth as needed (based on her weight).    [provider]  TRELEGY ELLIPTA 100-62.5-25 MCG/ACT AEPB Inhale 1 puff into the lungs daily. 10/27/23   [provider]      VITAL SIGNS:  Blood pressure 101/69, pulse 96, temperature 98 F (36.7 C), resp. rate (!) 22, height 5' 4 (1.626 m), weight 65.7 kg, SpO2 91%.  PHYSICAL EXAMINATION:  Physical Exam  GENERAL:  68 y.o.-year-old Caucasian female patient lying in the bed with no acute distress.  EYES: Pupils equal, round, reactive to light and accommodation. No scleral icterus. Extraocular muscles intact.  HEENT: Head atraumatic, normocephalic. Oropharynx and nasopharynx clear.  NECK:  Supple, no jugular venous distention. No thyroid  enlargement, no tenderness.  LUNGS: Diminished bibasilar breath sounds with bibasilar rales.  No use of accessory muscles of respiration.  CARDIOVASCULAR: Irregularly irregular rhythm, S1, S2 normal. No murmurs, rubs, or gallops.  ABDOMEN: Soft, nondistended, nontender. Bowel sounds  present. No organomegaly or mass.  EXTREMITIES: 2+ bilateral lower extremity pitting edema with no cyanosis, or clubbing.  NEUROLOGIC: Cranial nerves II through XII are intact. Muscle strength 5/5 in all extremities. Sensation intact. Gait not checked.  PSYCHIATRIC: The patient is alert and oriented x 3.  Normal affect and good eye contact. SKIN: No obvious rash, lesion, or ulcer.   LABORATORY PANEL:   CBC Recent Labs  Lab 12/09/23 0459  WBC 5.7  HGB 10.6*  HCT 34.8*  PLT 157   ------------------------------------------------------------------------------------------------------------------  Chemistries  Recent Labs  Lab 12/09/23 0459  NA 140  K 3.6  CL 93*  CO2 42*  GLUCOSE 112*  BUN 10  CREATININE 0.57  CALCIUM  9.0   ------------------------------------------------------------------------------------------------------------------  Cardiac Enzymes No results for input(s): TROPONINI in the last 168 hours. ------------------------------------------------------------------------------------------------------------------  RADIOLOGY:  DG Chest 2 View Result Date: 12/08/2023 CLINICAL DATA:  Shortness of breath. EXAM: CHEST - 2 VIEW COMPARISON:  Chest CT dated 10/06/2023. FINDINGS: Cardiomegaly with vascular congestion and edema. Small right pleural effusion and right lung base atelectasis or infiltrate, increased since the prior radiograph. No pneumothorax. Median sternotomy wires and aortic valve repair. Atherosclerotic calcification of the aorta. No acute osseous pathology. IMPRESSION: 1. Cardiomegaly with vascular congestion and edema. 2. Small right pleural effusion and right lung base atelectasis or infiltrate. Electronically Signed   By: Vanetta Chou M.D.   On: 12/08/2023 18:37      IMPRESSION AND PLAN:  Assessment and Plan: * Acute on chronic diastolic CHF (congestive heart failure) (HCC)  -The patient will be admitted to a cardiac telemetry bed. - We will  continue diuresis with IV Lasix . - We Will follow serial troponins. - We will follow I's and O's and daily weights. - Cardiology consult can be called later this a.m. - Last 2D echo showed EF of 55 to 60% in February of this year with indeterminant diastolic function.  Paroxysmal atrial fibrillation with RVR (HCC) - This seem to be controlled with diuresis with IV Lasix  as well as 0.5 mg p.o. Xanax . - Will continue Toprol -XL and monitor. - Will place the patient on Eliquis  as her CHA2DS2-VASc score is 6.  Anxiety - Will continue Xanax .  Dyslipidemia - Continue statin therapy.  Chronic obstructive pulmonary disease (COPD) (HCC) - Will continue her nebulizers at therapy with albuterol   while holding off long-acting beta agonist.  GERD without esophagitis - Will continue PPI therapy.     DVT prophylaxis: Eliquis . Advanced Care Planning:  Code Status: full code. Family Communication:  The plan of care was discussed in details with the patient (and family). I answered all questions. The patient agreed to proceed with the above mentioned plan. Further management will depend upon hospital course. Disposition Plan: Back to previous home environment Consults called: Cardiology consult can be called either this a.m. All the records are reviewed and case discussed with ED provider.  Status is: Inpatient  At the time of the admission, it appears that the appropriate admission status for this patient is inpatient.  This is judged to be reasonable and necessary in order to provide the required intensity of service to ensure the patient's safety given the presenting symptoms, physical exam findings and initial radiographic and laboratory data in the context of comorbid conditions.  The patient requires inpatient status due to high intensity of service, high risk of further deterioration and high frequency of surveillance required.  I certify that at the time of admission, it is my clinical  judgment that the patient will require inpatient hospital care extending more than 2 midnights.                            Dispo: The patient is from: Home              Anticipated d/c is to: Home              Patient currently is not medically stable to d/c.              Difficult to place patient: No  Madison DELENA Peaches M.D on 12/09/2023 at 7:04 AM  [facility]   From 7 PM-7 AM, contact night-coverage [email or web address]  CC: Primary care physician; Rosamond Leta NOVAK, MD

## 2023-12-09 NOTE — Assessment & Plan Note (Deleted)
-  The patient will be admitted to a cardiac telemetry bed. - We will continue diuresis with IV Lasix . - We Will follow serial troponins. - We will follow I's and O's and daily weights. - Cardiology consult can be called later this a.m. - Last 2D echo showed EF of 55 to 60% in February of this year with indeterminant diastolic function.

## 2023-12-09 NOTE — ED Notes (Signed)
 This RN went into pts room to help reposition her and replace her peri wick. Pt is resting comfortably with call bed at bedside.

## 2023-12-09 NOTE — Assessment & Plan Note (Addendum)
 Continue pantoprazole .,

## 2023-12-09 NOTE — Assessment & Plan Note (Addendum)
 Seems calm at admission  Plan Xanax prn

## 2023-12-09 NOTE — TOC CM/SW Note (Signed)
 Transition of Care [facility]) - Inpatient Brief Assessment   Patient Details  Name: Emily Rocha MRN: [id number] Date of Birth: [birth year]  [facility] CM/SW Contact:    Noreen KATHEE Cleotilde ISRAEL Phone Number: 12/09/2023, 11:50 AM   Clinical Narrative:  CSW spoke with patient and assessed her due to CHF consult. Patient lives with spouse and is independent. Patient uses oxygen  at home. Patient confirmed being followed by a heart doctor,  has a scale to weigh herself, eats what she wants, and take all her prescribed medications. CSW will continue to follow.   Transition of Care Asessment: Insurance and Status: Insurance coverage has been reviewed Patient has primary care physician: Yes Home environment has been reviewed: Single Family Home Prior level of function:: Independent Prior/Current Home Services: No current home services Social Drivers of Health Review: SDOH reviewed no interventions necessary Readmission risk has been reviewed: Yes Transition of care needs: no transition of care needs at this time

## 2023-12-09 NOTE — Progress Notes (Addendum)
  PROGRESS NOTE    Emily Rocha  FMW:[id number] DOB: 09-20-1955 DOA: 12/08/2023 PCP: Rosamond Leta NOVAK, MD   Same day Admission note:  Admitted with Afib with RVR and acute on chronic diastolic dysfunction. Feels better in terms of her shortness of breath. She does uses 2 L oxygen  at baseline at rest and 3 L on exertion She has significant cardiac history including CAD s/p stenting and CAGB, TAVR and had underwent right upper lobectomy.  She does use torsemide  at home but that hasn't helped. She follows up with Dr Alvan as her cardiologist. She does have a pulmonologist. She lives at home with her husband. Cardiology consulted and patient has been started on warfarin. TTE pending.     [name], MD [facility]  If 7PM-7AM, please contact night-coverage  12/09/2023, 9:49 AM

## 2023-12-09 NOTE — Progress Notes (Signed)
 Verified with Erminio Cone NP regarding patients blood pressure running on the lower side and Lasix  due at 2300.  Ok to give lasix  at that time.

## 2023-12-09 NOTE — Assessment & Plan Note (Addendum)
 Continue bronchodilator therapy Continue inhaled corticosteroids.  No signs of acute exacerbation .  Advanced COPD, patient follows with pulmonary as outpatient.  Possible addition of Bipap at home.  Continue supplemental 02 per  to keep 02 saturation 88% or greater.

## 2023-12-09 NOTE — Plan of Care (Signed)

## 2023-12-09 NOTE — Assessment & Plan Note (Addendum)
 Continue with atorvastatin

## 2023-12-10 ENCOUNTER — Other Ambulatory Visit (HOSPITAL_COMMUNITY): Payer: Self-pay | Admitting: *Deleted

## 2023-12-10 ENCOUNTER — Inpatient Hospital Stay (HOSPITAL_COMMUNITY)

## 2023-12-10 DIAGNOSIS — K219 Gastro-esophageal reflux disease without esophagitis: Secondary | ICD-10-CM

## 2023-12-10 DIAGNOSIS — J439 Emphysema, unspecified: Secondary | ICD-10-CM | POA: Diagnosis not present

## 2023-12-10 DIAGNOSIS — I480 Paroxysmal atrial fibrillation: Secondary | ICD-10-CM | POA: Diagnosis not present

## 2023-12-10 DIAGNOSIS — I5033 Acute on chronic diastolic (congestive) heart failure: Secondary | ICD-10-CM | POA: Diagnosis not present

## 2023-12-10 DIAGNOSIS — I4891 Unspecified atrial fibrillation: Secondary | ICD-10-CM | POA: Diagnosis not present

## 2023-12-10 LAB — PROTIME-INR
INR: 1.2 (ref 0.8–1.2)
Prothrombin Time: 15.8 s — ABNORMAL HIGH (ref 11.4–15.2)

## 2023-12-10 MED ORDER — WARFARIN SODIUM 5 MG PO TABS
5.0000 mg | ORAL_TABLET | Freq: Once | ORAL | Status: AC
Start: 2023-12-10 — End: 2023-12-10
  Administered 2023-12-10: 5 mg via ORAL
  Filled 2023-12-10: qty 1

## 2023-12-10 MED ORDER — MIDODRINE HCL 5 MG PO TABS
5.0000 mg | ORAL_TABLET | Freq: Once | ORAL | Status: AC
  Administered 2023-12-10: 5 mg via ORAL
  Filled 2023-12-10: qty 1

## 2023-12-10 MED ORDER — MIDODRINE HCL 5 MG PO TABS: 2.5000 mg | ORAL_TABLET | Freq: Once | ORAL | Status: DC

## 2023-12-10 NOTE — Plan of Care (Signed)

## 2023-12-10 NOTE — Progress Notes (Signed)
 PHARMACY - ANTICOAGULATION CONSULT NOTE  Pharmacy Consult for warfarin Indication: atrial fibrillation  Allergies  Allergen Reactions   Levaquin  [Levofloxacin ] Swelling    Patient Measurements: Height: 5' 4 (162.6 cm) Weight: 63.8 kg (140 lb 10.5 oz) IBW/kg (Calculated) : 54.7 HEPARIN  DW (KG): 66.8  Vital Signs: Temp: 98.1 F (36.7 C) (10/25 0404) Temp Source: Oral (10/25 0404) BP: 90/64 (10/25 0404) Pulse Rate: 71 (10/25 0404)  Labs: Recent Labs    12/08/23 1808 12/09/23 0459 12/10/23 0500  HGB 11.2* 10.6*  --   HCT 37.8 34.8*  --   PLT 181 157  --   LABPROT  --   --  15.8*  INR  --   --  1.2  CREATININE 0.54 0.57  --     Estimated Creatinine Clearance: 56.5 mL/min (by C-G formula based on SCr of 0.57 mg/dL).   Medical History: Past Medical History:  Diagnosis Date   Anxiety    Asthma    COPD (chronic obstructive pulmonary disease) (HCC)    Coronary artery disease    a. s/p NSTEMI in 11/2016 and required CABG with LIMA-LAD, SVG-LCx and SVG-D1 and complicated by cardiogenic shock b. cath in 05/2020 showing severe RCA stenosis and treated with orbital atherectomy and stent placement   GERD (gastroesophageal reflux disease)    History of CVA (cerebrovascular accident)    History of lung cancer 2020   Peripheral vascular disease    S/P TAVR (transcatheter aortic valve replacement) 08/12/2020   s/p TAVR with a 23 mm Edwards S3U via the TF approach by Dr. Verlin & Dr. Lucas   Severe aortic stenosis     Medications:  Medications Prior to Admission  Medication Sig Dispense Refill Last Dose/Taking   albuterol  (PROVENTIL ) (2.5 MG/3ML) 0.083% nebulizer solution Take 2.5 mg by nebulization every 4 (four) hours as needed for wheezing or shortness of breath.   12/08/2023 Morning   albuterol  (VENTOLIN  HFA) 108 (90 Base) MCG/ACT inhaler Inhale 1-2 puffs into the lungs every 6 (six) hours as needed for wheezing or shortness of breath.   12/08/2023 Noon   ALPRAZolam   (XANAX ) 0.5 MG tablet Take 0.5 mg by mouth 3 (three) times daily.   12/08/2023 Evening   Ascorbic Acid (VITAMIN C GUMMIE PO) Take 400 mg by mouth daily.   12/08/2023 Morning   aspirin  EC 81 MG tablet Take 81 mg by mouth daily. Swallow whole.   12/08/2023 Bedtime   atorvastatin  (LIPITOR ) 40 MG tablet Take 1 tablet (40 mg total) daily by mouth. 30 tablet 1 12/08/2023 Morning   azithromycin  (ZITHROMAX ) 250 MG tablet Take 1 tablet (250 mg total) by mouth 3 (three) times a week. 36 tablet 6 Past Week   diphenhydramine-acetaminophen  (TYLENOL  PM) 25-500 MG TABS tablet Take 2 tablets by mouth at bedtime.   12/08/2023 Bedtime   Fluticasone-Umeclidin-Vilant (TRELEGY ELLIPTA) 100-62.5-25 MCG/ACT AEPB Inhale 1 Inhalation into the lungs daily. 3 each 6 12/08/2023 Morning   ibuprofen (ADVIL) 200 MG tablet Take 400 mg by mouth every 6 (six) hours as needed for moderate pain (pain score 4-6).   12/08/2023 Morning   ipratropium-albuterol  (DUONEB) 0.5-2.5 (3) MG/3ML SOLN One in am and one at supper time 180 mL 11 Past Week   magnesium  oxide (MAG-OX) 400 (240 Mg) MG tablet Take 400 mg by mouth daily.   12/08/2023 Morning   metoprolol  succinate (TOPROL -XL) 25 MG 24 hr tablet TAKE 0.5 TABLETS (12.5 MG TOTAL) BY MOUTH DAILY 45 tablet 3 12/08/2023 Morning   pantoprazole  (PROTONIX ) 40  MG tablet Take 40 mg by mouth daily.   12/08/2023 Morning   potassium chloride  SA (KLOR-CON ) 20 MEQ tablet Take 20 mEq by mouth in the morning.   12/08/2023 Morning   torsemide  (DEMADEX ) 20 MG tablet Take 40 mg by mouth as needed (based on her weight).   12/08/2023 Morning   TRELEGY ELLIPTA 100-62.5-25 MCG/ACT AEPB Inhale 1 puff into the lungs daily.   12/08/2023 Morning   budesonide  (PULMICORT ) 0.25 MG/2ML nebulizer solution One vial with duonbe twice daily (Patient not taking: Reported on 12/09/2023) 120 mL 12 Not Taking   primidone (MYSOLINE) 50 MG tablet Take 50 mg by mouth daily.       Assessment: Pharmacy consulted to dose warfarin  in patient with atrial fibrillation.  Patient is primidone prior to admission which is contraindicated with Eliquis - could consider Eliquis  in future if patient stops taking primidone. Will bridge with lovenox  until INR therapeutic.  INR 1.2  Goal of Therapy:  INR 2-3 Monitor platelets by anticoagulation protocol: Yes   Plan:  Warfarin 5 mg x 1 dose Lovenox  1 mg/kg every 12 hours until INR therapeutic. Monitor daily INR and s/s of bleeding.  Elspeth Sour, PharmD Clinical Pharmacist 12/10/2023 7:56 AM

## 2023-12-10 NOTE — Plan of Care (Signed)

## 2023-12-10 NOTE — Progress Notes (Signed)
 Erminio Cone NP notified of blood pressure and upcoming lasix  dose, asking for verification to give.

## 2023-12-10 NOTE — Progress Notes (Signed)
 [facility]                                                                               Emily Rocha, is a 68 y.o. female, DOB - 05-04-1955, FMW:[id number] Admit date - 12/08/2023    Outpatient Primary MD for the patient is Emily Leta NOVAK, MD  LOS - 1  days    Brief summary   Emily Rocha is a 68 y.o. Caucasian female with medical history significant for COPD, asthma, anxiety, coronary artery disease status post CABG, peripheral vascular disease and TAVR, who presented to the emergency room with acute onset of palpitations, without chest pain. She admits to dyspnea on exertion without orthopnea or paroxysmal nocturnal dyspnea . She was started on IV lasix  and increased to lasix  80 mg BID.  Cardiology consulted.    Assessment & Plan    Assessment and Plan: * Acute on chronic diastolic CHF (congestive heart failure) (HCC) BNP is 2590, cxr Cardiomegaly with vascular congestion and edema.   Diuresing well with IV lasix  80 mg BID.  Diuresed about 1500 ml int he last 24 hours,  Creatinine remains stable.  Echocardiogram ordered and pending.  Cardiology on board.  Potassium is 3.6,  continue with potassium chloride  20 mg daily.    COPD No wheezing heard. Resume pulmicort  and duonebs.    CAD s/p CABG. S/P TAVR Patient denies any chest pain.    Paroxysmal atrial fibrillation with RVR (HCC) Rate controlled with metoprolol  12.5 mg BID.  On coumadin  and lovenox .   Anxiety Resume xanax .   Dyslipidemia Continue with lipitor .    GERD without esophagitis Continue with protonix .   Anemia of chronic disease;  Hemoglobin stable around 10.      Estimated body mass index is 24.14 kg/m as calculated from the following:   Height as of this encounter: 5' 4 (1.626 m).   Weight as of this encounter: 63.8 kg.  Code Status: DNR limited.  DVT Prophylaxis:  lovenox .   Level of Care: Level of care: Telemetry Family Communication: none at bedside.   Disposition  Plan:     Remains inpatient appropriate:  pending clinical improvement.   Procedures:  Echo pending.   Consultants:   cardiology  Antimicrobials:   Anti-infectives (From admission, onward)    None        Medications  Scheduled Meds:  ALPRAZolam   0.5 mg Oral TID   ascorbic acid  500 mg Oral Daily   atorvastatin   40 mg Oral Daily   budesonide   0.25 mg Nebulization Daily   enoxaparin  (LOVENOX ) injection  70 mg Subcutaneous Q12H   furosemide   80 mg Intravenous Q12H   magnesium  oxide  400 mg Oral Daily   metoprolol  tartrate  12.5 mg Oral BID   pantoprazole   40 mg Oral Daily   potassium chloride  SA  20 mEq Oral q AM   primidone  50 mg Oral Daily   Warfarin - Pharmacist Dosing Inpatient   Does not apply q1600   Continuous Infusions: PRN Meds:.acetaminophen  **OR** acetaminophen , ipratropium-albuterol , magnesium  hydroxide, ondansetron  **OR** ondansetron  (ZOFRAN ) IV, oxyCODONE , traZODone    Subjective:   Emily Rocha was seen and examined today.  No chest pain, sob has improved. Diuresed 1500 in the last 24 hours.   Objective:   Vitals:   12/09/23 2025 12/10/23 0404 12/10/23 0819 12/10/23 1435  BP: (!) 93/59 90/64  (!) 100/58  Pulse: 78 71  84  Resp: 18 20    Temp: 97.7 F (36.5 C) 98.1 F (36.7 C)  98.2 F (36.8 C)  TempSrc: Oral Oral  Oral  SpO2: 96% 97% (!) 80% 94%  Weight:  63.8 kg    Height:        Intake/Output Summary (Last 24 hours) at 12/10/2023 1748 Last data filed at 12/09/2023 2102 Gross per 24 hour  Intake --  Output 400 ml  Net -400 ml   Filed Weights   12/08/23 1742 12/09/23 0839 12/10/23 0404  Weight: 65.7 kg 66.8 kg 63.8 kg     Exam General exam: Appears calm and comfortable  Respiratory system: Clear to auscultation. Respiratory effort normal. Cardiovascular system: S1 & S2 heard, RRR. No JVD,  Gastrointestinal system: Abdomen is nondistended, soft and nontender.  Central nervous system: Alert and oriented.  Extremities: Symmetric 5  x 5 power. Skin: No rashes, lesions or ulcers Psychiatry: mood is appropriate. .    Data Reviewed:  I have personally reviewed following labs and imaging studies   CBC Lab Results  Component Value Date   WBC 5.7 12/09/2023   RBC 3.60 (L) 12/09/2023   HGB 10.6 (L) 12/09/2023   HCT 34.8 (L) 12/09/2023   MCV 96.7 12/09/2023   MCH 29.4 12/09/2023   PLT 157 12/09/2023   MCHC 30.5 12/09/2023   RDW 16.0 (H) 12/09/2023   LYMPHSABS 1.4 08/08/2023   MONOABS 0.7 08/08/2023   EOSABS 0.0 08/08/2023   BASOSABS 0.1 08/08/2023     Last metabolic panel Lab Results  Component Value Date   NA 140 12/09/2023   K 3.6 12/09/2023   CL 93 (L) 12/09/2023   CO2 42 (H) 12/09/2023   BUN 10 12/09/2023   CREATININE 0.57 12/09/2023   GLUCOSE 112 (H) 12/09/2023   GFRNONAA >60 12/09/2023   GFRAA >60 11/13/2019   CALCIUM  9.0 12/09/2023   PHOS 3.3 08/08/2023   PROT 7.3 05/17/2022   ALBUMIN  4.0 05/17/2022   BILITOT 0.8 05/17/2022   ALKPHOS 82 05/17/2022   AST 20 05/17/2022   ALT 18 05/17/2022   ANIONGAP 4 (L) 12/09/2023    CBG (last 3)  No results for input(s): GLUCAP in the last 72 hours.    Coagulation Profile: Recent Labs  Lab 12/10/23 0500  INR 1.2     Radiology Studies: DG Chest 2 View Result Date: 12/08/2023 CLINICAL DATA:  Shortness of breath. EXAM: CHEST - 2 VIEW COMPARISON:  Chest CT dated 10/06/2023. FINDINGS: Cardiomegaly with vascular congestion and edema. Small right pleural effusion and right lung base atelectasis or infiltrate, increased since the prior radiograph. No pneumothorax. Median sternotomy wires and aortic valve repair. Atherosclerotic calcification of the aorta. No acute osseous pathology. IMPRESSION: 1. Cardiomegaly with vascular congestion and edema. 2. Small right pleural effusion and right lung base atelectasis or infiltrate. Electronically Signed   By: Vanetta Chou M.D.   On: 12/08/2023 18:37       Elgie Butter M.D. [facility] 12/10/2023, 5:48 PM  Available via Epic secure chat 7am-7pm After 7 pm, please refer to night coverage provider listed on amion.

## 2023-12-10 NOTE — Progress Notes (Signed)
*  PRELIMINARY RESULTS* Echocardiogram 2D Echocardiogram has been performed.  Emily Rocha 12/10/2023, 4:27 PM

## 2023-12-11 DIAGNOSIS — I5033 Acute on chronic diastolic (congestive) heart failure: Secondary | ICD-10-CM | POA: Diagnosis not present

## 2023-12-11 DIAGNOSIS — I2510 Atherosclerotic heart disease of native coronary artery without angina pectoris: Secondary | ICD-10-CM

## 2023-12-11 DIAGNOSIS — J439 Emphysema, unspecified: Secondary | ICD-10-CM | POA: Diagnosis not present

## 2023-12-11 DIAGNOSIS — I480 Paroxysmal atrial fibrillation: Secondary | ICD-10-CM | POA: Diagnosis not present

## 2023-12-11 LAB — ECHOCARDIOGRAM COMPLETE
AR max vel: 1.28 cm2
AV Area VTI: 1.83 cm2
AV Area mean vel: 1.48 cm2
AV Mean grad: 7 mmHg
AV Peak grad: 13.3 mmHg
Ao pk vel: 1.82 m/s
Area-P 1/2: 4.49 cm2
Height: 64 in
MV M vel: 4.56 m/s
MV Peak grad: 83.2 mmHg
P 1/2 time: 382 ms
S' Lateral: 3.4 cm
Weight: 2250.46 [oz_av]

## 2023-12-11 LAB — MAGNESIUM: Magnesium: 2.1 mg/dL (ref 1.7–2.4)

## 2023-12-11 LAB — BASIC METABOLIC PANEL WITH GFR
Anion gap: 5 (ref 5–15)
BUN: 12 mg/dL (ref 8–23)
CO2: 40 mmol/L — ABNORMAL HIGH (ref 22–32)
Calcium: 8.8 mg/dL — ABNORMAL LOW (ref 8.9–10.3)
Chloride: 91 mmol/L — ABNORMAL LOW (ref 98–111)
Creatinine, Ser: 0.6 mg/dL (ref 0.44–1.00)
GFR, Estimated: >60 mL/min (ref >60)
Glucose, Bld: 85 mg/dL (ref 70–99)
Potassium: 4.2 mmol/L (ref 3.5–5.1)
Sodium: 136 mmol/L (ref 135–145)

## 2023-12-11 LAB — PROTIME-INR
INR: 1.7 — ABNORMAL HIGH (ref 0.8–1.2)
Prothrombin Time: 20.8 s — ABNORMAL HIGH (ref 11.4–15.2)

## 2023-12-11 MED ORDER — IPRATROPIUM-ALBUTEROL 0.5-2.5 (3) MG/3ML IN SOLN
3.0000 mL | Freq: Two times a day (BID) | RESPIRATORY_TRACT | Status: DC
  Administered 2023-12-12 – 2023-12-20 (×16): 3 mL via RESPIRATORY_TRACT
  Filled 2023-12-11 (×17): qty 3

## 2023-12-11 MED ORDER — SPIRONOLACTONE 25 MG PO TABS
25.0000 mg | ORAL_TABLET | Freq: Every day | ORAL | Status: DC
  Administered 2023-12-11 – 2023-12-13 (×3): 25 mg via ORAL
  Filled 2023-12-11 (×3): qty 1

## 2023-12-11 MED ORDER — WARFARIN SODIUM 2.5 MG PO TABS
2.5000 mg | ORAL_TABLET | Freq: Once | ORAL | Status: AC
  Administered 2023-12-11: 2.5 mg via ORAL
  Filled 2023-12-11: qty 1

## 2023-12-11 MED ORDER — ENOXAPARIN SODIUM 80 MG/0.8ML IJ SOSY
65.0000 mg | PREFILLED_SYRINGE | Freq: Two times a day (BID) | INTRAMUSCULAR | Status: DC
Start: 2023-12-11 — End: 2023-12-12
  Administered 2023-12-11 (×2): 65 mg via SUBCUTANEOUS
  Filled 2023-12-11 (×3): qty 0.8

## 2023-12-11 MED ORDER — EMPAGLIFLOZIN 10 MG PO TABS
10.0000 mg | ORAL_TABLET | Freq: Every day | ORAL | Status: DC
  Administered 2023-12-11 – 2023-12-18 (×8): 10 mg via ORAL
  Filled 2023-12-11 (×8): qty 1

## 2023-12-11 MED ORDER — TRIPLE ANTIBIOTIC 3.5-400-5000 EX OINT
TOPICAL_OINTMENT | Freq: Three times a day (TID) | CUTANEOUS | Status: DC
Start: 2023-12-11 — End: 2023-12-17
  Administered 2023-12-12 – 2023-12-16 (×7): 1 via TOPICAL
  Filled 2023-12-11: qty 1
  Filled 2023-12-11: qty 14.17
  Filled 2023-12-11 (×7): qty 1

## 2023-12-11 NOTE — Plan of Care (Signed)

## 2023-12-11 NOTE — Progress Notes (Addendum)
 Progress Note   [name] FMW:[id number] DOB: [birth year] DOA: 12/08/2023     2 DOS: the patient was seen and examined on 12/11/2023   Brief hospital course: Mrs. Roadcap was admitted to the hospital with the working diagnosis of heart failure exacerbation  68 yo female with the past medical history of COPD, astham, coronary artery disease, peripheral vascular disease, and sp TAVR who presented with palpitations and dyspnea. No PND, orthopnea or lower extremity edema.  On her initial physical examination her blood pressure was 136/120, HR 102, RR 24 and 02 saturation 94%  Lungs with decreased breath sounds at bases and positive bilateral rales, heart with S1 and S2 present, irregularly irregular with no gallops, rubs or murmurs, abdomen with no distention and positive lower extremity edema.   Na 139, K 4,1 Cl 93 bicarbonate 40 glucose 95 bun 12 cr 0,54  BNP 2,590  High sensitive troponin 19 and 17   Chest radiograph with cardiomegaly, bilateral hilar vascular congestion, bilateral pleural effusions, more right than left. Sternotomy wires in place  EKG 138 bpm, normal axis, qtc 511, atrial fibrillation rhythm with no significant ST segment or T wave changes.   Patient was placed on IV furosemide  for diuresis.   Assessment and Plan: * Acute on chronic diastolic CHF (congestive heart failure) (HCC) Echocardiogram with preserved LV systolic function with EF 55 to 60%, mild LVH, interventricular septum flattened in systole and diastole, RV systolic function with moderate reduction, RVSP 39,8 mmHg, LA and RA with severe dilatation, moderate tricuspid regurgitation, sp TAVR.   Acute on chronic cor pulmonale Pulmonary hypertension, likely group 3 RV failure with poor prognosis.   Documented urine output is 1,050 ml Systolic blood pressure 100 to 90 mmHg range.   Continue diuresis with furosemide  80 mg IV bid Add spironolactone  and SGLT 2 inh to augment diuresis.  Continue  metoprolol .  Limited RAAS inhibition due to risk of hypotension.  Patient got midodrine last night.   CAD (coronary artery disease)/Prior CABG No acute coronary syndrome.  Continue blood pressure monitoring   Paroxysmal atrial fibrillation with RVR (HCC) Continue rate control with metoprolol .  Anticoagulation with warfarin   Chronic obstructive pulmonary disease (COPD) (HCC) Continue bronchodilator therapy Continue inhaled corticosteroids.  No signs of acute exacerbation .  Dyslipidemia Continue with atorvastatin    Anxiety Continue with alprazolam .   GERD without esophagitis Continue pantoprazole .,   Subjective: Patient continue to have dyspnea and cough, edema is improving but not yet back to baseline.   Physical Exam: Vitals:   12/10/23 2230 12/11/23 0553 12/11/23 0916 12/11/23 1338  BP: (!) 96/48 (!) 123/49  93/60  Pulse:  79  73  Resp:    17  Temp:  98.7 F (37.1 C)  97.9 F (36.6 C)  TempSrc:  Oral    SpO2:  92% 92% 92%  Weight:  65 kg    Height:       Neurology awake and alert ENT with mild pallor Cardiovascular with S1 and S2 present and regular, with no gallops or rubs, positive systolic murmur at the left lower sternal border Positive JVD Respiratory with decreased breath sounds and prolonged expiratory phase, no wheezing, mild rales at bases Abdomen soft and non tender, not distended Positive lower extremity edema Mild erythema with central scab, at the anterior forearm.  Data Reviewed:    Family Communication: no family at the bedside   Disposition: Status is: Inpatient Remains inpatient appropriate because: recovering heart failure   Planned Discharge  Destination: Home   Author: Landyn Buckalew Daniel Blanchard [place], MD 12/11/2023 3:57 PM  For on call review [email or web address].

## 2023-12-11 NOTE — Hospital Course (Addendum)
 [name] was admitted to the hospital with the working diagnosis of heart failure exacerbation, in the setting of pulmonary hypertension.   68 yo female with the past medical history of COPD, astham, coronary artery disease, peripheral vascular disease, and sp TAVR who presented with palpitations and dyspnea. No PND, orthopnea or lower extremity but positive lower extremity edema  On her initial physical examination her blood pressure was 136/120, HR 102, RR 24 and 02 saturation 94%  Lungs with decreased breath sounds at bases and positive bilateral rales, heart with S1 and S2 present, irregularly irregular with no gallops, rubs or murmurs, abdomen with no distention and positive lower extremity edema.   Na 139, K 4,1 Cl 93 bicarbonate 40 glucose 95 bun 12 cr 0,54  BNP 2,590  High sensitive troponin 19 and 17   Chest radiograph with cardiomegaly, bilateral hilar vascular congestion, bilateral pleural effusions, more right than left. Sternotomy wires in place  EKG 138 bpm, normal axis, qtc 511, atrial fibrillation rhythm with no significant ST segment or T wave changes.   Patient was placed on IV furosemide  for diuresis.  Echocardiogram with RV failure and severe pulmonary hypertension.  Patient was placed on midodrine for blood pressure support.  10/31 right thoracentesis 600 cc removed.   Decision was made to transfer to [facility] for cardiac catheterization.  11/01 At [facility], plan for catheterization on 11/03  11/03 cardiac catheterization with elevated pulmonary pressures, consistent with pulmonary hypertension group 3.  11/04 clinically with improved volume status, plan to continue medical therapy at home and follow up with pulmonary as outpatient

## 2023-12-11 NOTE — Assessment & Plan Note (Addendum)
 Echocardiogram with preserved LV systolic function with EF 55 to 60%, mild LVH, interventricular septum flattened in systole and diastole, RV systolic function with moderate reduction, RVSP 39,8 mmHg, LA and RA with severe dilatation, moderate tricuspid regurgitation, sp TAVR.   Cardiac catheterization . RA 17  RV 60/19 PA 50/15 PCWP 13 Cardiac output 6.0 and index 3.6 (fick) Cardiac output 5.7 and index 3.6 (thermodilution)  PVR 3.0 WU  Acute on chronic cor pulmonale Pulmonary hypertension, likely group 3 RV failure with poor prognosis.   Documented urine output is 2,050 ml Systolic blood pressure 100 to 120 mmHg range.   Patient had IV furosemide , likely transition to po loop diuretic in am.  Continue with SGLT 2 inh.  Continue metoprolol .  Limited RAAS inhibition due to risk of hypotension.  Continue midodrine for blood pressure support.

## 2023-12-11 NOTE — Assessment & Plan Note (Signed)
 No acute coronary syndrome.  Continue blood pressure monitoring

## 2023-12-11 NOTE — Progress Notes (Signed)
 PHARMACY - ANTICOAGULATION CONSULT NOTE  Pharmacy Consult for warfarin Indication: atrial fibrillation  Allergies  Allergen Reactions   Levaquin  [Levofloxacin ] Swelling    Patient Measurements: Height: 5' 4 (162.6 cm) Weight: 65 kg (143 lb 4.8 oz) IBW/kg (Calculated) : 54.7 HEPARIN  DW (KG): 66.8  Vital Signs: Temp: 98.7 F (37.1 C) (10/26 0553) Temp Source: Oral (10/26 0553) BP: 123/49 (10/26 0553) Pulse Rate: 79 (10/26 0553)  Labs: Recent Labs    12/08/23 1808 12/09/23 0459 12/10/23 0500 12/11/23 0451  HGB 11.2* 10.6*  --   --   HCT 37.8 34.8*  --   --   PLT 181 157  --   --   LABPROT  --   --  15.8* 20.8*  INR  --   --  1.2 1.7*  CREATININE 0.54 0.57  --  0.60    Estimated Creatinine Clearance: 56.5 mL/min (by C-G formula based on SCr of 0.6 mg/dL).   Medical History: Past Medical History:  Diagnosis Date   Anxiety    Asthma    COPD (chronic obstructive pulmonary disease) (HCC)    Coronary artery disease    a. s/p NSTEMI in 11/2016 and required CABG with LIMA-LAD, SVG-LCx and SVG-D1 and complicated by cardiogenic shock b. cath in 05/2020 showing severe RCA stenosis and treated with orbital atherectomy and stent placement   GERD (gastroesophageal reflux disease)    History of CVA (cerebrovascular accident)    History of lung cancer 2020   Peripheral vascular disease    S/P TAVR (transcatheter aortic valve replacement) 08/12/2020   s/p TAVR with a 23 mm Edwards S3U via the TF approach by Dr. Verlin & Dr. Lucas   Severe aortic stenosis     Medications:  Medications Prior to Admission  Medication Sig Dispense Refill Last Dose/Taking   albuterol  (PROVENTIL ) (2.5 MG/3ML) 0.083% nebulizer solution Take 2.5 mg by nebulization every 4 (four) hours as needed for wheezing or shortness of breath.   12/08/2023 Morning   albuterol  (VENTOLIN  HFA) 108 (90 Base) MCG/ACT inhaler Inhale 1-2 puffs into the lungs every 6 (six) hours as needed for wheezing or shortness  of breath.   12/08/2023 Noon   ALPRAZolam  (XANAX ) 0.5 MG tablet Take 0.5 mg by mouth 3 (three) times daily.   12/08/2023 Evening   Ascorbic Acid (VITAMIN C GUMMIE PO) Take 400 mg by mouth daily.   12/08/2023 Morning   aspirin  EC 81 MG tablet Take 81 mg by mouth daily. Swallow whole.   12/08/2023 Bedtime   atorvastatin  (LIPITOR ) 40 MG tablet Take 1 tablet (40 mg total) daily by mouth. 30 tablet 1 12/08/2023 Morning   azithromycin  (ZITHROMAX ) 250 MG tablet Take 1 tablet (250 mg total) by mouth 3 (three) times a week. 36 tablet 6 Past Week   diphenhydramine-acetaminophen  (TYLENOL  PM) 25-500 MG TABS tablet Take 2 tablets by mouth at bedtime.   12/08/2023 Bedtime   Fluticasone-Umeclidin-Vilant (TRELEGY ELLIPTA) 100-62.5-25 MCG/ACT AEPB Inhale 1 Inhalation into the lungs daily. 3 each 6 12/08/2023 Morning   ibuprofen (ADVIL) 200 MG tablet Take 400 mg by mouth every 6 (six) hours as needed for moderate pain (pain score 4-6).   12/08/2023 Morning   ipratropium-albuterol  (DUONEB) 0.5-2.5 (3) MG/3ML SOLN One in am and one at supper time 180 mL 11 Past Week   magnesium  oxide (MAG-OX) 400 (240 Mg) MG tablet Take 400 mg by mouth daily.   12/08/2023 Morning   metoprolol  succinate (TOPROL -XL) 25 MG 24 hr tablet TAKE 0.5 TABLETS (12.5 MG  TOTAL) BY MOUTH DAILY 45 tablet 3 12/08/2023 Morning   pantoprazole  (PROTONIX ) 40 MG tablet Take 40 mg by mouth daily.   12/08/2023 Morning   potassium chloride  SA (KLOR-CON ) 20 MEQ tablet Take 20 mEq by mouth in the morning.   12/08/2023 Morning   torsemide  (DEMADEX ) 20 MG tablet Take 40 mg by mouth as needed (based on her weight).   12/08/2023 Morning   TRELEGY ELLIPTA 100-62.5-25 MCG/ACT AEPB Inhale 1 puff into the lungs daily.   12/08/2023 Morning   budesonide  (PULMICORT ) 0.25 MG/2ML nebulizer solution One vial with duonbe twice daily (Patient not taking: Reported on 12/09/2023) 120 mL 12 Not Taking   primidone (MYSOLINE) 50 MG tablet Take 50 mg by mouth daily.        Assessment: Pharmacy consulted to dose warfarin in patient with atrial fibrillation.  Patient is primidone prior to admission which is contraindicated with Eliquis - could consider Eliquis  in future if patient stops taking primidone. Will bridge with lovenox  until INR therapeutic.  INR 1.2> 1.7  Goal of Therapy:  INR 2-3 Monitor platelets by anticoagulation protocol: Yes   Plan:  Warfarin 2.5 mg x 1 dose Lovenox  1 mg/kg every 12 hours until INR therapeutic. Monitor daily INR and s/s of bleeding.  Elspeth Sour, PharmD Clinical Pharmacist 12/11/2023 8:07 AM

## 2023-12-11 NOTE — Plan of Care (Signed)
   Problem: Health Behavior/Discharge Planning: Goal: Ability to manage health-related needs will improve Outcome: Progressing   Problem: Activity: Goal: Risk for activity intolerance will decrease Outcome: Progressing   Problem: Coping: Goal: Level of anxiety will decrease Outcome: Progressing

## 2023-12-12 DIAGNOSIS — I5033 Acute on chronic diastolic (congestive) heart failure: Secondary | ICD-10-CM | POA: Diagnosis not present

## 2023-12-12 LAB — CBC
HCT: 35.1 % — ABNORMAL LOW (ref 36.0–46.0)
Hemoglobin: 10.5 g/dL — ABNORMAL LOW (ref 12.0–15.0)
MCH: 28.9 pg (ref 26.0–34.0)
MCHC: 29.9 g/dL — ABNORMAL LOW (ref 30.0–36.0)
MCV: 96.7 fL (ref 80.0–100.0)
Platelets: 148 K/uL — ABNORMAL LOW (ref 150–400)
RBC: 3.63 MIL/uL — ABNORMAL LOW (ref 3.87–5.11)
RDW: 15.9 % — ABNORMAL HIGH (ref 11.5–15.5)
WBC: 5.8 K/uL (ref 4.0–10.5)
nRBC: 0 % (ref 0.0–0.2)

## 2023-12-12 LAB — BASIC METABOLIC PANEL WITH GFR
Anion gap: 4 — ABNORMAL LOW (ref 5–15)
BUN: 10 mg/dL (ref 8–23)
CO2: 42 mmol/L — ABNORMAL HIGH (ref 22–32)
Calcium: 8.9 mg/dL (ref 8.9–10.3)
Chloride: 92 mmol/L — ABNORMAL LOW (ref 98–111)
Creatinine, Ser: 0.58 mg/dL (ref 0.44–1.00)
GFR, Estimated: >60 mL/min (ref >60)
Glucose, Bld: 85 mg/dL (ref 70–99)
Potassium: 3.8 mmol/L (ref 3.5–5.1)
Sodium: 138 mmol/L (ref 135–145)

## 2023-12-12 LAB — PROTIME-INR
INR: 2 — ABNORMAL HIGH (ref 0.8–1.2)
Prothrombin Time: 23.2 s — ABNORMAL HIGH (ref 11.4–15.2)

## 2023-12-12 LAB — MAGNESIUM: Magnesium: 2.1 mg/dL (ref 1.7–2.4)

## 2023-12-12 MED ORDER — MIDODRINE HCL 5 MG PO TABS
5.0000 mg | ORAL_TABLET | Freq: Three times a day (TID) | ORAL | Status: DC
  Administered 2023-12-12 – 2023-12-20 (×25): 5 mg via ORAL
  Filled 2023-12-12 (×25): qty 1

## 2023-12-12 MED ORDER — FUROSEMIDE 10 MG/ML IJ SOLN
80.0000 mg | Freq: Three times a day (TID) | INTRAMUSCULAR | Status: AC
  Administered 2023-12-12 (×2): 80 mg via INTRAVENOUS
  Filled 2023-12-12: qty 8

## 2023-12-12 MED ORDER — ACETAZOLAMIDE SODIUM 500 MG IJ SOLR
250.0000 mg | Freq: Once | INTRAMUSCULAR | Status: AC
Start: 2023-12-12 — End: 2023-12-12
  Administered 2023-12-12: 250 mg via INTRAVENOUS
  Filled 2023-12-12: qty 500

## 2023-12-12 MED ORDER — GUAIFENESIN-DM 100-10 MG/5ML PO SYRP
5.0000 mL | ORAL_SOLUTION | ORAL | Status: DC | PRN
  Administered 2023-12-12: 5 mL via ORAL
  Filled 2023-12-12: qty 5

## 2023-12-12 MED ORDER — WARFARIN SODIUM 2 MG PO TABS
4.0000 mg | ORAL_TABLET | Freq: Once | ORAL | Status: AC
Start: 2023-12-12 — End: 2023-12-12
  Administered 2023-12-12: 4 mg via ORAL
  Filled 2023-12-12: qty 2

## 2023-12-12 MED ORDER — MUPIROCIN 2 % EX OINT
1.0000 | TOPICAL_OINTMENT | Freq: Two times a day (BID) | CUTANEOUS | Status: AC
  Administered 2023-12-12 – 2023-12-16 (×10): 1 via NASAL
  Filled 2023-12-12 (×2): qty 22

## 2023-12-12 MED ORDER — HYDROCODONE BIT-HOMATROP MBR 5-1.5 MG/5ML PO SOLN: 5.0000 mL | Freq: Three times a day (TID) | ORAL | Status: DC | PRN

## 2023-12-12 MED ORDER — CHLORHEXIDINE GLUCONATE CLOTH 2 % EX PADS
6.0000 | MEDICATED_PAD | Freq: Every day | CUTANEOUS | Status: AC
  Administered 2023-12-12 – 2023-12-16 (×5): 6 via TOPICAL

## 2023-12-12 MED ORDER — POTASSIUM CHLORIDE CRYS ER 20 MEQ PO TBCR
40.0000 meq | EXTENDED_RELEASE_TABLET | Freq: Once | ORAL | Status: AC
  Administered 2023-12-12: 40 meq via ORAL
  Filled 2023-12-12: qty 2

## 2023-12-12 NOTE — Plan of Care (Signed)

## 2023-12-12 NOTE — Progress Notes (Signed)
 Progress Note   [name] FMW:[id number] DOB: 1955-06-23 DOA: 12/08/2023     3 DOS: the patient was seen and examined on 12/12/2023   Brief hospital admission narrative: As per H&P written by Dr. Lawence on 12/09/2023 Ms. RUSHIE BRAZEL is a 68 y.o. Caucasian female with medical history significant for COPD, asthma, anxiety, coronary artery disease status post CABG, peripheral vascular disease and TAVR, who presented to the emergency room with acute onset of palpitations, without chest pain.  She described it as a feeling of heart fluttering.  She admits to dyspnea on exertion without orthopnea or paroxysmal nocturnal dyspnea.  She denied any cough or wheezing.  No fever or chills.  No nausea or vomiting or abdominal pain.  No dysuria, oliguria or hematuria or flank pain.   ED Course: Upon presentation to the emergency room, BP was 136/120 with heart rate 102 and respiratory to 24 pulse oximetry 94% on room air.  Labs revealed CO2 of 40 with chloride 93 and otherwise unremarkable BMP.  proBNP was 2590 and high sensitive troponin I was 19 and later 17.  CBC showed hemoglobin 11.2 hematocrit 37.8. EKG as reviewed by me : EKG showed atrial fibrillation with rapid ventricular sponsor 138, right axis deviation and prolonged QT interval with QTc 511 MS.. Imaging: 2 view chest x-ray showed cardiomegaly with vascular congestion and edema as well as small right pleural effusion and right lung base atelectasis/infiltrate.  Assessment and Plan: * Acute on chronic diastolic CHF (congestive heart failure) (HCC) -Echocardiogram with preserved LV systolic function with EF 55 to 60%, mild LVH, interventricular septum flattened in systole and diastole, RV systolic function with moderate reduction, RVSP 39,8 mmHg, LA and RA with severe dilatation, moderate tricuspid regurgitation, sp TAVR. -Acute on chronic cor pulmonale -Pulmonary hypertension, likely group 3 -RV failure with overall poor prognosis.  -  Continue IV diuresis - Spironolactone  and SGLT2 inhibitor has been added - Cardiology service has been consulted and will follow any further recommendations. - Will continue the use of midodrine to create room for further diuresis.  CAD (coronary artery disease)/Prior CABG -No acute coronary syndrome.  -Continue blood pressure monitoring   Paroxysmal atrial fibrillation with RVR (HCC) -Continue rate control with metoprolol .  -Anticoagulation with warfarin   Chronic obstructive pulmonary disease (COPD) (HCC) -Continue bronchodilator therapy -Continue inhaled corticosteroids.  -No signs of acute exacerbation .  Dyslipidemia -Continue statin.  Anxiety -Continue with alprazolam .   GERD without esophagitis -Continue PPI.  Subjective: Continues to be short winded with minimal activity; mildly productive nonproductive coughing spells and expressing orthopnea.  Positive signs of fluid overload.  Physical Exam: Vitals:   12/12/23 1007 12/12/23 1008 12/12/23 1229 12/12/23 1446  BP: (!) 85/62 (!) 85/62 122/89 (!) 111/90  Pulse:  79 77 75  Resp:   20   Temp:  98.1 F (36.7 C) 98.7 F (37.1 C) 97.8 F (36.6 C)  TempSrc:  Oral Oral Oral  SpO2:  93% 91% 96%  Weight:      Height:       General exam: Alert, awake, oriented x 3; in no major distress.  Still reporting to be short winded with minimal activity.  3 L nasal cannula supplementation in place. Respiratory system: Decreased breath sounds at the bases; positive crackles. Cardiovascular system: Rate controlled, positive murmur, positive JVD; no rubs or gallops. Gastrointestinal system: Abdomen is nondistended, soft and nontender. No organomegaly or masses felt. Normal bowel sounds heard. Central nervous system: Moving 4 limbs  spontaneously.  No focal neurological deficits. Extremities: No cyanosis or clubbing; 1+ edema appreciated bilaterally. Skin: No petechiae. Psychiatry: Judgement and insight appear normal.  Flat  affect.  Data Reviewed: Basic metabolic panel: Sodium 138, potassium 3.8, chloride 92, bicarb 42, BUN 10, creatinine 0.58 and GFR >60 CBC: WBCs 5.8, hemoglobin 10.5 and platelet counts 148K   Family Communication: no family at the bedside   Disposition: Status is: Inpatient Remains inpatient appropriate because: recovering heart failure    Planned Discharge Destination: Home   Author: Eric Nunnery, MD 12/12/2023 5:35 PM  For on call review [email or web address].

## 2023-12-12 NOTE — Progress Notes (Signed)
 Attempted Reds Clip procedure, however equipment failed.  [employer] to be informed.

## 2023-12-12 NOTE — Progress Notes (Signed)
 Mobility Specialist Progress Note:    12/12/23 1410  Mobility  Activity Ambulated with assistance  Level of Assistance Contact guard assist, steadying assist  Assistive Device None  Distance Ambulated (ft) 240 ft  Range of Motion/Exercises Active;All extremities  Activity Response Tolerated well  Mobility Referral Yes  Mobility visit 1 Mobility  Mobility Specialist Start Time (ACUTE ONLY) 1410  Mobility Specialist Stop Time (ACUTE ONLY) 1430  Mobility Specialist Time Calculation (min) (ACUTE ONLY) 20 min   Pt received sitting EOB, agreeable to mobility. Required CGA to stand and ambulate with no AD. Tolerated well, SpO2 88% on 3L after using the restroom. Ambulated on 4L, SpO2 88-95%. Returned sitting EOB, all needs met.  Billey Wojciak Mobility Specialist Please contact via Special Educational Needs Teacher or  Rehab office at [phone]

## 2023-12-12 NOTE — Progress Notes (Addendum)
 PHARMACY - ANTICOAGULATION CONSULT NOTE  Pharmacy Consult for warfarin Indication: atrial fibrillation  Allergies  Allergen Reactions   Levaquin  [Levofloxacin ] Swelling    Patient Measurements: Height: 5' 4 (162.6 cm) Weight: 63.1 kg (139 lb 1.8 oz) IBW/kg (Calculated) : 54.7 HEPARIN  DW (KG): 66.8  Vital Signs: Temp: 98.5 F (36.9 C) (10/27 0442) Temp Source: Oral (10/27 0442) BP: 132/58 (10/27 0442) Pulse Rate: 84 (10/27 0442)  Labs: Recent Labs    12/10/23 0500 12/11/23 0451 12/12/23 0518  HGB  --   --  10.5*  HCT  --   --  35.1*  PLT  --   --  148*  LABPROT 15.8* 20.8* 23.2*  INR 1.2 1.7* 2.0*  CREATININE  --  0.60 0.58    Estimated Creatinine Clearance: 56.5 mL/min (by C-G formula based on SCr of 0.58 mg/dL).   Medical History: Past Medical History:  Diagnosis Date   Anxiety    Asthma    COPD (chronic obstructive pulmonary disease) (HCC)    Coronary artery disease    a. s/p NSTEMI in 11/2016 and required CABG with LIMA-LAD, SVG-LCx and SVG-D1 and complicated by cardiogenic shock b. cath in 05/2020 showing severe RCA stenosis and treated with orbital atherectomy and stent placement   GERD (gastroesophageal reflux disease)    History of CVA (cerebrovascular accident)    History of lung cancer 2020   Peripheral vascular disease    S/P TAVR (transcatheter aortic valve replacement) 08/12/2020   s/p TAVR with a 23 mm Edwards S3U via the TF approach by Dr. Verlin & Dr. Lucas   Severe aortic stenosis     Medications:  Medications Prior to Admission  Medication Sig Dispense Refill Last Dose/Taking   albuterol  (PROVENTIL ) (2.5 MG/3ML) 0.083% nebulizer solution Take 2.5 mg by nebulization every 4 (four) hours as needed for wheezing or shortness of breath.   12/08/2023 Morning   albuterol  (VENTOLIN  HFA) 108 (90 Base) MCG/ACT inhaler Inhale 1-2 puffs into the lungs every 6 (six) hours as needed for wheezing or shortness of breath.   12/08/2023 Noon    ALPRAZolam  (XANAX ) 0.5 MG tablet Take 0.5 mg by mouth 3 (three) times daily.   12/08/2023 Evening   Ascorbic Acid (VITAMIN C GUMMIE PO) Take 400 mg by mouth daily.   12/08/2023 Morning   aspirin  EC 81 MG tablet Take 81 mg by mouth daily. Swallow whole.   12/08/2023 Bedtime   atorvastatin  (LIPITOR ) 40 MG tablet Take 1 tablet (40 mg total) daily by mouth. 30 tablet 1 12/08/2023 Morning   azithromycin  (ZITHROMAX ) 250 MG tablet Take 1 tablet (250 mg total) by mouth 3 (three) times a week. 36 tablet 6 Past Week   diphenhydramine-acetaminophen  (TYLENOL  PM) 25-500 MG TABS tablet Take 2 tablets by mouth at bedtime.   12/08/2023 Bedtime   Fluticasone-Umeclidin-Vilant (TRELEGY ELLIPTA) 100-62.5-25 MCG/ACT AEPB Inhale 1 Inhalation into the lungs daily. 3 each 6 12/08/2023 Morning   ibuprofen (ADVIL) 200 MG tablet Take 400 mg by mouth every 6 (six) hours as needed for moderate pain (pain score 4-6).   12/08/2023 Morning   ipratropium-albuterol  (DUONEB) 0.5-2.5 (3) MG/3ML SOLN One in am and one at supper time 180 mL 11 Past Week   magnesium  oxide (MAG-OX) 400 (240 Mg) MG tablet Take 400 mg by mouth daily.   12/08/2023 Morning   metoprolol  succinate (TOPROL -XL) 25 MG 24 hr tablet TAKE 0.5 TABLETS (12.5 MG TOTAL) BY MOUTH DAILY 45 tablet 3 12/08/2023 Morning   pantoprazole  (PROTONIX ) 40 MG tablet  Take 40 mg by mouth daily.   12/08/2023 Morning   potassium chloride  SA (KLOR-CON ) 20 MEQ tablet Take 20 mEq by mouth in the morning.   12/08/2023 Morning   torsemide  (DEMADEX ) 20 MG tablet Take 40 mg by mouth as needed (based on her weight).   12/08/2023 Morning   TRELEGY ELLIPTA 100-62.5-25 MCG/ACT AEPB Inhale 1 puff into the lungs daily.   12/08/2023 Morning   budesonide  (PULMICORT ) 0.25 MG/2ML nebulizer solution One vial with duonbe twice daily (Patient not taking: Reported on 12/09/2023) 120 mL 12 Not Taking   primidone (MYSOLINE) 50 MG tablet Take 50 mg by mouth daily.       Assessment: Pharmacy consulted to dose  warfarin in patient with atrial fibrillation.  Patient is primidone prior to admission which is contraindicated with Eliquis - could consider Eliquis  in future if patient stops taking primidone. Will bridge with lovenox  until INR therapeutic.  INR 1.2> 1.7>2.0 CBC WNL  Goal of Therapy:  INR 2-3 Monitor platelets by anticoagulation protocol: Yes   Plan:  Stop lovenox  Warfarin 4 mg x 1 dose Monitor daily INR and s/s of bleeding.  Elspeth Sour, PharmD Clinical Pharmacist 12/12/2023 8:08 AM

## 2023-12-12 NOTE — Progress Notes (Signed)
 PT Cancellation Note  Patient Details Name: CYD HOSTLER MRN: [id number] DOB: 12/28/1955   Cancelled Treatment:    Reason Eval/Treat Not Completed: PT screened, no needs identified, will sign off. Patient ambulating independently .   1:49 PM, 12/12/23 Lynwood Music, MPT Physical Therapist with [facility] [address] [phone] office [phone] mobile phone

## 2023-12-12 NOTE — Progress Notes (Signed)
 Heart Failure Navigator Progress Note Assessed for Heart & Vascular TOC clinic readiness. Navigator just reached out to patient via telephone @ [facility] room 332.  I was going to provide CHF education and schedule a CHF TOC.  Patient shared that her doctor just told her that she would be stabilized and set up with Hospice.  Navigator will continue to follow MD notes while she is hospitalized. Once a discharge plan is established Navigator will follow back up with patient if needed.  Navigator available for reassessment of patient.   Charmaine Pines, RN, BSN [facility] Heart Failure Navigator Secure Chat Only

## 2023-12-12 NOTE — Progress Notes (Signed)
 Rounding Note   Patient Name: Emily Rocha Date of Encounter: 12/12/2023  [facility] Cardiologist: Alvan Carrier, MD   Subjective Some ongoing SOB but improving  Scheduled Meds:  ALPRAZolam   0.5 mg Oral TID   ascorbic acid  500 mg Oral Daily   atorvastatin   40 mg Oral Daily   budesonide   0.25 mg Nebulization Daily   empagliflozin  10 mg Oral Daily   enoxaparin  (LOVENOX ) injection  65 mg Subcutaneous Q12H   furosemide   80 mg Intravenous Q12H   ipratropium-albuterol   3 mL Nebulization BID   magnesium  oxide  400 mg Oral Daily   metoprolol  tartrate  12.5 mg Oral BID   neomycin-bacitracin-polymyxin   Topical TID   pantoprazole   40 mg Oral Daily   primidone  50 mg Oral Daily   spironolactone   25 mg Oral Daily   Warfarin - Pharmacist Dosing Inpatient   Does not apply q1600   Continuous Infusions:  PRN Meds: acetaminophen  **OR** acetaminophen , HYDROcodone bit-homatropine, ipratropium-albuterol , magnesium  hydroxide, ondansetron  **OR** ondansetron  (ZOFRAN ) IV, oxyCODONE , traZODone   Vital Signs  Vitals:   12/11/23 1925 12/12/23 0442 12/12/23 0500 12/12/23 0722  BP: (!) 113/43 (!) 132/58    Pulse: 81 84    Resp: 18 18    Temp: 97.7 F (36.5 C) 98.5 F (36.9 C)    TempSrc: Oral Oral    SpO2: 95% (!) 88%  93%  Weight:   63.1 kg   Height:        Intake/Output Summary (Last 24 hours) at 12/12/2023 0811 Last data filed at 12/12/2023 0500 Gross per 24 hour  Intake 960 ml  Output 1600 ml  Net -640 ml      12/12/2023    5:00 AM 12/11/2023    5:53 AM 12/10/2023    4:04 AM  Last 3 Weights  Weight (lbs) 139 lb 1.8 oz 143 lb 4.8 oz 140 lb 10.5 oz  Weight (kg) 63.1 kg 65 kg 63.8 kg      Telemetry SR, PACs, SVT - Personally Reviewed  ECG  N/a - Personally Reviewed  Physical Exam  GEN: No acute distress.   Neck: +JVD Cardiac: RRR, 2/6 systolic murmur rusb Respiratory: crackles bilaterallly GI: Soft, nontender, non-distended  MS: 1+ bilateral LE  edema Neuro:  Nonfocal  Psych: Normal affect   Labs High Sensitivity Troponin:  No results for input(s): TROPONINIHS in the last 720 hours.   Chemistry Recent Labs  Lab 12/09/23 0459 12/11/23 0451 12/12/23 0518  NA 140 136 138  K 3.6 4.2 3.8  CL 93* 91* 92*  CO2 42* 40* 42*  GLUCOSE 112* 85 85  BUN 10 12 10   CREATININE 0.57 0.60 0.58  CALCIUM  9.0 8.8* 8.9  MG  --  2.1 2.1  GFRNONAA >60 >60 >60  ANIONGAP 4* 5 4*    Lipids No results for input(s): CHOL, TRIG, HDL, LABVLDL, LDLCALC, CHOLHDL in the last 168 hours.  Hematology Recent Labs  Lab 12/08/23 1808 12/09/23 0459 12/12/23 0518  WBC 5.8 5.7 5.8  RBC 3.86* 3.60* 3.63*  HGB 11.2* 10.6* 10.5*  HCT 37.8 34.8* 35.1*  MCV 97.9 96.7 96.7  MCH 29.0 29.4 28.9  MCHC 29.6* 30.5 29.9*  RDW 15.9* 16.0* 15.9*  PLT 181 157 148*   Thyroid  No results for input(s): TSH, FREET4 in the last 168 hours.  BNP Recent Labs  Lab 12/08/23 1808  PROBNP 2,590.0*    DDimer No results for input(s): DDIMER in the last 168 hours.  Radiology  ECHOCARDIOGRAM COMPLETE Result Date: 12/11/2023    ECHOCARDIOGRAM REPORT   Patient Name:   Emily Rocha Date of Exam: 12/10/2023 Medical Rec #:  [id number]    Height:       64.0 in Accession #:    [id number]   Weight:       140.7 lb Date of Birth:  21-Mar-1955    BSA:          1.685 m Patient Age:    68 years     BP:           90/64 mmHg Patient Gender: F            HR:           71 bpm. Exam Location:  [facility] Procedure: 2D Echo, 3D Echo, Cardiac Doppler and Color Doppler (Both Spectral            and Color Flow Doppler were utilized during procedure). Indications:    Atrial Fibrillation I48.91  History:        Patient has prior history of Echocardiogram examinations, most                 recent 03/30/2022. CHF, CAD and Previous Myocardial Infarction,                 Prior CABG, COPD, Arrythmias:Atrial Fibrillation; Risk                 Factors:Current Smoker and Dyslipidemia. Aortic  Valve: 23 mm                 Edwards Ultra, stented (TAVR) valve is present in the aortic                 position. Procedure Date: 08/12/20.  Sonographer:    Aida Pizza RCS Referring Phys: [id number] VISHNU P MALLIPEDDI  Sonographer Comments: IMPRESSIONS  1. Left ventricular ejection fraction, by estimation, is 55 to 60%. The left ventricle has normal function. The left ventricle has no regional wall motion abnormalities. There is mild asymmetric left ventricular hypertrophy. Left ventricular diastolic function could not be evaluated. There is the interventricular septum is flattened in systole and diastole, consistent with right ventricular pressure and volume overload.  2. Right ventricular systolic function is moderately reduced. The right ventricular size is moderately enlarged. There is mildly elevated pulmonary artery systolic pressure. The estimated right ventricular systolic pressure is 39.8 mmHg.  3. Left atrial size was severely dilated.  4. Right atrial size was severely dilated.  5. The mitral valve is degenerative. Mild mitral valve regurgitation. Moderate to severe mitral annular calcification.  6. Tricuspid valve regurgitation is moderate.  7. Mild paravalvular leak. The aortic valve has been repaired/replaced. Aortic valve regurgitation is mild. Procedure Date: 08/12/2020. Aortic valve area, by VTI measures 1.83 cm. Aortic valve mean gradient measures 7.0 mmHg. Aortic valve Vmax measures 1.82 m/s.  8. The inferior vena cava is dilated in size with <50% respiratory variability, suggesting right atrial pressure of 15 mmHg. Comparison(s): No significant change from prior study. FINDINGS  Left Ventricle: Left ventricular ejection fraction, by estimation, is 55 to 60%. The left ventricle has normal function. The left ventricle has no regional wall motion abnormalities. 3D ejection fraction reviewed and evaluated as part of the interpretation. Alternate measurement of EF is felt to be most reflective of LV  function. The left ventricular internal cavity size was normal in size. There is mild asymmetric left ventricular hypertrophy. The interventricular  septum is flattened in systole and diastole, consistent with right ventricular pressure and volume overload. Left ventricular diastolic function could not be evaluated due to atrial fibrillation. Left ventricular diastolic function could not be evaluated. Right Ventricle: The right ventricular size is moderately enlarged. No increase in right ventricular wall thickness. Right ventricular systolic function is moderately reduced. There is mildly elevated pulmonary artery systolic pressure. The tricuspid regurgitant velocity is 2.49 m/s, and with an assumed right atrial pressure of 15 mmHg, the estimated right ventricular systolic pressure is 39.8 mmHg. Left Atrium: Left atrial size was severely dilated. Right Atrium: Right atrial size was severely dilated. Pericardium: There is no evidence of pericardial effusion. Mitral Valve: The mitral valve is degenerative in appearance. Moderate to severe mitral annular calcification. Mild mitral valve regurgitation. Tricuspid Valve: The tricuspid valve is grossly normal. Tricuspid valve regurgitation is moderate . No evidence of tricuspid stenosis. Aortic Valve: Mild paravalvular leak. The aortic valve has been repaired/replaced. Aortic valve regurgitation is mild. Aortic regurgitation PHT measures 382 msec. Aortic valve mean gradient measures 7.0 mmHg. Aortic valve peak gradient measures 13.3 mmHg. Aortic valve area, by VTI measures 1.83 cm. There is a 23 mm Sapien prosthetic, stented (TAVR) valve present in the aortic position. Pulmonic Valve: The pulmonic valve was grossly normal. Pulmonic valve regurgitation is trivial. No evidence of pulmonic stenosis. Aorta: The aortic root is normal in size and structure. Venous: The inferior vena cava is dilated in size with less than 50% respiratory variability, suggesting right atrial  pressure of 15 mmHg. IAS/Shunts: The atrial septum is grossly normal. Additional Comments: 3D was performed not requiring image post processing on an independent workstation and was indeterminate.  LEFT VENTRICLE PLAX 2D LVIDd:         4.70 cm   Diastology LVIDs:         3.40 cm   LV e' medial:    4.35 cm/s LV PW:         1.00 cm   LV E/e' medial:  36.3 LV IVS:        1.20 cm   LV e' lateral:   12.90 cm/s LVOT diam:     1.90 cm   LV E/e' lateral: 12.2 LV SV:         57 LV SV Index:   34 LVOT Area:     2.84 cm                           3D Volume EF:                          3D EF:        52 %                          LV EDV:       116 ml                          LV ESV:       55 ml                          LV SV:        60 ml RIGHT VENTRICLE RV S prime:     7.83 cm/s TAPSE (M-mode): 1.7 cm LEFT ATRIUM  Index        RIGHT ATRIUM           Index LA diam:        4.30 cm 2.55 cm/m   RA Area:     27.40 cm LA Vol (A2C):   83.4 ml 49.51 ml/m  RA Volume:   94.20 ml  55.92 ml/m LA Vol (A4C):   80.2 ml 47.61 ml/m LA Biplane Vol: 82.7 ml 49.09 ml/m  AORTIC VALVE AV Area (Vmax):    1.28 cm AV Area (Vmean):   1.48 cm AV Area (VTI):     1.83 cm AV Vmax:           182.33 cm/s AV Vmean:          116.367 cm/s AV VTI:            0.311 m AV Peak Grad:      13.3 mmHg AV Mean Grad:      7.0 mmHg LVOT Vmax:         82.47 cm/s LVOT Vmean:        60.567 cm/s LVOT VTI:          0.201 m LVOT/AV VTI ratio: 0.64 AI PHT:            382 msec  AORTA Ao Root diam: 2.90 cm MITRAL VALVE                TRICUSPID VALVE MV Area (PHT): 4.49 cm     TR Peak grad:   24.8 mmHg MV Decel Time: 169 msec     TR Vmax:        249.00 cm/s MR Peak grad: 83.2 mmHg MR Mean grad: 53.0 mmHg     SHUNTS MR Vmax:      456.00 cm/s   Systemic VTI:  0.20 m MR Vmean:     345.0 cm/s    Systemic Diam: 1.90 cm MV E velocity: 158.00 cm/s MV A velocity: 74.50 cm/s MV E/A ratio:  2.12 Darryle Decent MD Electronically signed by Darryle Decent MD Signature  Date/Time: 12/11/2023/10:46:23 AM    Final     Cardiac Studies     Assessment & Plan   1.Chronic HFpEF -11/2023 echo: LVEF 55-60%< no WMAs, indet diastolic function, D shaped septum, mod RV dysfucntion, PASP 40, severe BAE, mild MR, mild paravalvular leak TAVR valve - Presented with DOE, nocturnal cough, abdominal distention, pitting edema in the legs for the last few weeks  - CXR pulm congestion and edema. ProBNP 2590 -possibly exacerbated by recurrent afib with RVR.   - she is on IV lasix  80mg  bid. Per I/Os negative yesterday, 3.9 L since admission. Wt 144-->139 lbs. Renal function is stable - remains fluid overloaded, dose IV lasix  80mg  x 3 today and reassess tomorrow - metabolic alkalosis, dose acetazolamide  250mg  x 1.  - she is on jardiance 10mg  daily.      2.CAD - s/p CABG (LIMA-LAD, SVG-LCX, SVG-Diag) after presenting 11/2016 with NSTEMI. Presentation complicated by cardiogenic shock  - 05/2020 cath received PCI to prox to mid RCA    3.Aortic stenosis - s/p TAVR 07/2020  - 06/2023 echo: Mild paravalvular leak. Aortic valve area, by VTI measures 1.83 cm. Aortic valve mean gradient measures  7.0 mmHg.    4. Afib - prior admission with afib with RVR in setting of pneumonia/sepsis -previously  not on anticoag due to isolated occurrence, also had prior SDH when on eliquis  for PE in the past. This  was 10/2019 admission in setting of multiple falls at home, from notes on multiple sedatives at home - recurrent afib this admission. With recurrent afib started on anticoagulation, prior SDH was traumatic and not spontaneous.   -Currently on primidone.  DOACs contraindicated.  Start warfarin, pharmacy consulted.  Bridged with subcutaneous Lovenox  1 mg/kg twice daily.  -INR is 2 today, stop lovenox .  - has been paroxysmal afib this admission. Rate controlled with lopressor  12.5mg  bid.    For questions or updates, please contact [facility] Please consult  [email or web address] for contact info under       Signed, Alvan Carrier, MD  12/12/2023, 8:11 AM

## 2023-12-13 DIAGNOSIS — I50810 Right heart failure, unspecified: Secondary | ICD-10-CM | POA: Diagnosis not present

## 2023-12-13 DIAGNOSIS — I5033 Acute on chronic diastolic (congestive) heart failure: Secondary | ICD-10-CM | POA: Diagnosis not present

## 2023-12-13 LAB — HEPATIC FUNCTION PANEL
ALT: 13 U/L (ref 0–44)
AST: 25 U/L (ref 15–41)
Albumin: 3.7 g/dL (ref 3.5–5.0)
Alkaline Phosphatase: 146 U/L — ABNORMAL HIGH (ref 38–126)
Bilirubin, Direct: 0.3 mg/dL — ABNORMAL HIGH (ref 0.0–0.2)
Indirect Bilirubin: 0.2 mg/dL — ABNORMAL LOW (ref 0.3–0.9)
Total Bilirubin: 0.5 mg/dL (ref 0.0–1.2)
Total Protein: 6.4 g/dL — ABNORMAL LOW (ref 6.5–8.1)

## 2023-12-13 LAB — LACTIC ACID, PLASMA: Lactic Acid, Venous: 0.6 mmol/L (ref 0.5–1.9)

## 2023-12-13 LAB — BASIC METABOLIC PANEL WITH GFR
Anion gap: 5 (ref 5–15)
BUN: 10 mg/dL (ref 8–23)
CO2: 40 mmol/L — ABNORMAL HIGH (ref 22–32)
Calcium: 8.5 mg/dL — ABNORMAL LOW (ref 8.9–10.3)
Chloride: 91 mmol/L — ABNORMAL LOW (ref 98–111)
Creatinine, Ser: 0.61 mg/dL (ref 0.44–1.00)
GFR, Estimated: >60 mL/min (ref >60)
Glucose, Bld: 92 mg/dL (ref 70–99)
Potassium: 3.7 mmol/L (ref 3.5–5.1)
Sodium: 136 mmol/L (ref 135–145)

## 2023-12-13 LAB — PROTIME-INR
INR: 2.4 — ABNORMAL HIGH (ref 0.8–1.2)
Prothrombin Time: 27.5 s — ABNORMAL HIGH (ref 11.4–15.2)

## 2023-12-13 MED ORDER — POTASSIUM CHLORIDE CRYS ER 20 MEQ PO TBCR
40.0000 meq | EXTENDED_RELEASE_TABLET | Freq: Once | ORAL | Status: AC
  Administered 2023-12-13: 40 meq via ORAL
  Filled 2023-12-13: qty 2

## 2023-12-13 MED ORDER — WARFARIN SODIUM 2.5 MG PO TABS
2.5000 mg | ORAL_TABLET | Freq: Once | ORAL | Status: AC
Start: 2023-12-13 — End: 2023-12-13
  Administered 2023-12-13: 2.5 mg via ORAL
  Filled 2023-12-13: qty 1

## 2023-12-13 NOTE — Progress Notes (Signed)
 Progress Note   [name] DOB: 25-Nov-1955 DOA: 12/08/2023     4 DOS: the patient was seen and examined on 12/13/2023   Brief hospital admission narrative: As per H&P written by Dr. Lawence on 12/09/2023 [name] is a 68 y.o. Caucasian female with medical history significant for COPD, asthma, anxiety, coronary artery disease status post CABG, peripheral vascular disease and TAVR, who presented to the emergency room with acute onset of palpitations, without chest pain.  She described it as a feeling of heart fluttering.  She admits to dyspnea on exertion without orthopnea or paroxysmal nocturnal dyspnea.  She denied any cough or wheezing.  No fever or chills.  No nausea or vomiting or abdominal pain.  No dysuria, oliguria or hematuria or flank pain.   ED Course: Upon presentation to the emergency room, BP was 136/120 with heart rate 102 and respiratory to 24 pulse oximetry 94% on room air.  Labs revealed CO2 of 40 with chloride 93 and otherwise unremarkable BMP.  proBNP was 2590 and high sensitive troponin I was 19 and later 17.  CBC showed hemoglobin 11.2 hematocrit 37.8. EKG as reviewed by me : EKG showed atrial fibrillation with rapid ventricular sponsor 138, right axis deviation and prolonged QT interval with QTc 511 MS.. Imaging: 2 view chest x-ray showed cardiomegaly with vascular congestion and edema as well as small right pleural effusion and right lung base atelectasis/infiltrate.  Assessment and Plan: * Acute on chronic diastolic CHF (congestive heart failure) (HCC) -Echocardiogram with preserved LV systolic function with EF 55 to 60%, mild LVH, interventricular septum flattened in systole and diastole, RV systolic function with moderate reduction, RVSP 39,8 mmHg, LA and RA with severe dilatation, moderate tricuspid regurgitation, sp TAVR. -Acute on chronic cor pulmonale -Pulmonary hypertension, likely group 3 -RV failure with overall poor prognosis.  -  Spironolactone  and SGLT2 inhibitor has been added - Holding on IV diuretics today following cardiology recommendations; okay to continue midodrine I will place patient on TED hoses.  Follow response and further recommendations.  CAD (coronary artery disease)/Prior CABG -No acute coronary syndrome.  -Continue blood pressure monitoring   Paroxysmal atrial fibrillation with RVR (HCC) -Continue rate control with metoprolol .  -Anticoagulation with warfarin   Chronic obstructive pulmonary disease (COPD) (HCC) -Continue bronchodilator therapy -Continue inhaled corticosteroids.  -No signs of acute exacerbation .  Dyslipidemia -Continue statin.  Anxiety -Continue with alprazolam .   GERD without esophagitis -Continue PPI.  Subjective: Reports breathing easier and denies any chest pain, nausea or vomiting.  3 L supplementation in place with good saturation.  Patient overall close to 8 L removal send admission and roughly 10 pounds lighter.    Physical Exam: Vitals:   12/12/23 2159 12/13/23 0338 12/13/23 0712 12/13/23 1335  BP:  (!) 96/46  (!) 99/55  Pulse:  81  75  Resp: 20 18    Temp:  98.3 F (36.8 C)  98.6 F (37 C)  TempSrc:  Oral  Oral  SpO2:  95% 96% 95%  Weight:  61.1 kg    Height:       General exam: Alert, awake, oriented x 3; hard of hearing; in no acute distress.  Still complaining of intermittent pleuritic chest discomfort but overall improved and her breathing has been reported. Respiratory system: Decreased breath sounds at the bases without frank crackles; no wheezing, no using accessory muscles.  Good saturation on 3 L supplementation. Cardiovascular system: Rate controlled, positive murmur, no rubs, no gallops,  no JVD. Gastrointestinal system: Abdomen is nondistended, soft and nontender. No organomegaly or masses felt. Normal bowel sounds heard. Central nervous system:No focal neurological deficits. Extremities: No cyanosis or clubbing; 1+ edema appreciated  bilaterally. Skin: No petechiae. Psychiatry: Judgement and insight appear normal.  Flat affect.  Latest data Reviewed: Basic metabolic panel: Sodium 136, potassium 3.7, chloride 91, bicarb 40, glucose 92, BUN 10, creatinine 0.61 and GFR >60 Lactic acid: 0.6 Prothrombin time/INR: 27.5 and 2.4 respectively.   Family Communication: Husband at bedside.  Disposition: Status is: Inpatient Remains inpatient appropriate because: recovering heart failure    Planned Discharge Destination: Home   Author: Eric Nunnery, MD 12/13/2023 6:04 PM  For on call review [email or web address].

## 2023-12-13 NOTE — Progress Notes (Signed)
 PHARMACY - ANTICOAGULATION CONSULT NOTE  Pharmacy Consult for warfarin Indication: atrial fibrillation  Allergies  Allergen Reactions   Levaquin  [Levofloxacin ] Swelling    Patient Measurements: Height: 5' 4 (162.6 cm) Weight: 61.1 kg (134 lb 11.2 oz) IBW/kg (Calculated) : 54.7 HEPARIN  DW (KG): 66.8  Vital Signs: Temp: 98.3 F (36.8 C) (10/28 0338) Temp Source: Oral (10/28 0338) BP: 96/46 (10/28 0338) Pulse Rate: 81 (10/28 0338)  Labs: Recent Labs    12/11/23 0451 12/12/23 0518 12/13/23 0455  HGB  --  10.5*  --   HCT  --  35.1*  --   PLT  --  148*  --   LABPROT 20.8* 23.2* 27.5*  INR 1.7* 2.0* 2.4*  CREATININE 0.60 0.58 0.61    Estimated Creatinine Clearance: 56.5 mL/min (by C-G formula based on SCr of 0.61 mg/dL).   Medical History: Past Medical History:  Diagnosis Date   Anxiety    Asthma    COPD (chronic obstructive pulmonary disease) (HCC)    Coronary artery disease    a. s/p NSTEMI in 11/2016 and required CABG with LIMA-LAD, SVG-LCx and SVG-D1 and complicated by cardiogenic shock b. cath in 05/2020 showing severe RCA stenosis and treated with orbital atherectomy and stent placement   GERD (gastroesophageal reflux disease)    History of CVA (cerebrovascular accident)    History of lung cancer 2020   Peripheral vascular disease    S/P TAVR (transcatheter aortic valve replacement) 08/12/2020   s/p TAVR with a 23 mm Edwards S3U via the TF approach by Dr. Verlin & Dr. Lucas   Severe aortic stenosis     Medications:  Medications Prior to Admission  Medication Sig Dispense Refill Last Dose/Taking   albuterol  (PROVENTIL ) (2.5 MG/3ML) 0.083% nebulizer solution Take 2.5 mg by nebulization every 4 (four) hours as needed for wheezing or shortness of breath.   12/08/2023 Morning   albuterol  (VENTOLIN  HFA) 108 (90 Base) MCG/ACT inhaler Inhale 1-2 puffs into the lungs every 6 (six) hours as needed for wheezing or shortness of breath.   12/08/2023 Noon    ALPRAZolam  (XANAX ) 0.5 MG tablet Take 0.5 mg by mouth 3 (three) times daily.   12/08/2023 Evening   Ascorbic Acid (VITAMIN C GUMMIE PO) Take 400 mg by mouth daily.   12/08/2023 Morning   aspirin  EC 81 MG tablet Take 81 mg by mouth daily. Swallow whole.   12/08/2023 Bedtime   atorvastatin  (LIPITOR ) 40 MG tablet Take 1 tablet (40 mg total) daily by mouth. 30 tablet 1 12/08/2023 Morning   azithromycin  (ZITHROMAX ) 250 MG tablet Take 1 tablet (250 mg total) by mouth 3 (three) times a week. 36 tablet 6 Past Week   diphenhydramine-acetaminophen  (TYLENOL  PM) 25-500 MG TABS tablet Take 2 tablets by mouth at bedtime.   12/08/2023 Bedtime   Fluticasone-Umeclidin-Vilant (TRELEGY ELLIPTA) 100-62.5-25 MCG/ACT AEPB Inhale 1 Inhalation into the lungs daily. 3 each 6 12/08/2023 Morning   ibuprofen (ADVIL) 200 MG tablet Take 400 mg by mouth every 6 (six) hours as needed for moderate pain (pain score 4-6).   12/08/2023 Morning   ipratropium-albuterol  (DUONEB) 0.5-2.5 (3) MG/3ML SOLN One in am and one at supper time 180 mL 11 Past Week   magnesium  oxide (MAG-OX) 400 (240 Mg) MG tablet Take 400 mg by mouth daily.   12/08/2023 Morning   metoprolol  succinate (TOPROL -XL) 25 MG 24 hr tablet TAKE 0.5 TABLETS (12.5 MG TOTAL) BY MOUTH DAILY 45 tablet 3 12/08/2023 Morning   pantoprazole  (PROTONIX ) 40 MG tablet Take 40  mg by mouth daily.   12/08/2023 Morning   potassium chloride  SA (KLOR-CON ) 20 MEQ tablet Take 20 mEq by mouth in the morning.   12/08/2023 Morning   torsemide  (DEMADEX ) 20 MG tablet Take 40 mg by mouth as needed (based on her weight).   12/08/2023 Morning   TRELEGY ELLIPTA 100-62.5-25 MCG/ACT AEPB Inhale 1 puff into the lungs daily.   12/08/2023 Morning   budesonide  (PULMICORT ) 0.25 MG/2ML nebulizer solution One vial with duonbe twice daily (Patient not taking: Reported on 12/09/2023) 120 mL 12 Not Taking   primidone (MYSOLINE) 50 MG tablet Take 50 mg by mouth daily.       Assessment: Pharmacy consulted to dose  warfarin in patient with atrial fibrillation.  Patient is primidone prior to admission which is contraindicated with Eliquis - could consider Eliquis  in future if patient stops taking primidone.   INR 1.2> 1.7>2.0>2.4 CBC WNL  Goal of Therapy:  INR 2-3 Monitor platelets by anticoagulation protocol: Yes   Plan:  Warfarin 2.5 mg x 1 dose Monitor daily INR and s/s of bleeding.  Dempsey Blush PharmD., BCPS Clinical Pharmacist 12/13/2023 11:09 AM

## 2023-12-13 NOTE — Plan of Care (Signed)

## 2023-12-13 NOTE — Discharge Instructions (Addendum)

## 2023-12-13 NOTE — Plan of Care (Signed)
 Plan of care is reviewed. Pt has been progressing. She is afebrile, stable hemodynamically, no acute distress noted. Pt is able to rest and sleep well overnight with no major complaints. We will continue to monitor.   Problem: Clinical Measurements: Goal: Ability to maintain clinical measurements within normal limits will improve Outcome: Progressing Goal: Will remain free from infection Outcome: Progressing Goal: Diagnostic test results will improve Outcome: Progressing Goal: Respiratory complications will improve Outcome: Progressing Goal: Cardiovascular complication will be avoided Outcome: Progressing   Problem: Activity: Goal: Risk for activity intolerance will decrease Outcome: Progressing   Problem: Nutrition: Goal: Adequate nutrition will be maintained Outcome: Progressing   Problem: Coping: Goal: Level of anxiety will decrease Outcome: Progressing   Problem: Elimination: Goal: Will not experience complications related to bowel motility Outcome: Progressing Goal: Will not experience complications related to urinary retention Outcome: Progressing   Problem: Pain Managment: Goal: General experience of comfort will improve and/or be controlled Outcome: Progressing   Problem: Safety: Goal: Ability to remain free from injury will improve Outcome: Progressing   Problem: Skin Integrity: Goal: Risk for impaired skin integrity will decrease Outcome: Progressing   Problem: Activity: Goal: Capacity to carry out activities will improve Outcome: Progressing   Problem: Cardiac: Goal: Ability to achieve and maintain adequate cardiopulmonary perfusion will improve Outcome: Progressing   Wendi Dash, RN

## 2023-12-13 NOTE — Progress Notes (Signed)
 Rounding Note   Patient Name: Emily Rocha Date of Encounter: 12/13/2023  [facility] Cardiologist: Alvan Carrier, MD   Subjective Some improvement in SOB over last few days.   Scheduled Meds:  ALPRAZolam   0.5 mg Oral TID   ascorbic acid  500 mg Oral Daily   atorvastatin   40 mg Oral Daily   budesonide   0.25 mg Nebulization Daily   Chlorhexidine  Gluconate Cloth  6 each Topical Daily   empagliflozin  10 mg Oral Daily   furosemide   80 mg Intravenous TID   ipratropium-albuterol   3 mL Nebulization BID   magnesium  oxide  400 mg Oral Daily   metoprolol  tartrate  12.5 mg Oral BID   midodrine  5 mg Oral TID WC   mupirocin  ointment  1 Application Nasal BID   neomycin-bacitracin-polymyxin   Topical TID   pantoprazole   40 mg Oral Daily   primidone  50 mg Oral Daily   spironolactone   25 mg Oral Daily   Warfarin - Pharmacist Dosing Inpatient   Does not apply q1600   Continuous Infusions:  PRN Meds: acetaminophen  **OR** acetaminophen , HYDROcodone bit-homatropine, ipratropium-albuterol , magnesium  hydroxide, ondansetron  **OR** ondansetron  (ZOFRAN ) IV, oxyCODONE , traZODone   Vital Signs  Vitals:   12/12/23 1932 12/12/23 2159 12/13/23 0338 12/13/23 0712  BP: (!) 117/51  (!) 96/46   Pulse: 85  81   Resp: 18 20 18    Temp: 98.3 F (36.8 C)  98.3 F (36.8 C)   TempSrc: Oral  Oral   SpO2: 95%  95% 96%  Weight:   61.1 kg   Height:        Intake/Output Summary (Last 24 hours) at 12/13/2023 0919 Last data filed at 12/13/2023 0600 Gross per 24 hour  Intake 1250 ml  Output 2550 ml  Net -1300 ml      12/13/2023    3:38 AM 12/12/2023    5:00 AM 12/11/2023    5:53 AM  Last 3 Weights  Weight (lbs) 134 lb 11.2 oz 139 lb 1.8 oz 143 lb 4.8 oz  Weight (kg) 61.1 kg 63.1 kg 65 kg      Telemetry SR, PACs - Personally Reviewed  ECG  N/a - Personally Reviewed  Physical Exam  GEN: No acute distress.   Neck: + JVD Cardiac: RRR, 2/6 systolic murmur rusb Respiratory:  crackles bilaterally GI: Soft, nontender, non-distended  MS: 2+ bilateral LE edema. Neuro:  Nonfocal  Psych: Normal affect   Labs High Sensitivity Troponin:  No results for input(s): TROPONINIHS in the last 720 hours.   Chemistry Recent Labs  Lab 12/11/23 0451 12/12/23 0518 12/13/23 0455  NA 136 138 136  K 4.2 3.8 3.7  CL 91* 92* 91*  CO2 40* 42* 40*  GLUCOSE 85 85 92  BUN 12 10 10   CREATININE 0.60 0.58 0.61  CALCIUM  8.8* 8.9 8.5*  MG 2.1 2.1  --   GFRNONAA >60 >60 >60  ANIONGAP 5 4* 5    Lipids No results for input(s): CHOL, TRIG, HDL, LABVLDL, LDLCALC, CHOLHDL in the last 168 hours.  Hematology Recent Labs  Lab 12/08/23 1808 12/09/23 0459 12/12/23 0518  WBC 5.8 5.7 5.8  RBC 3.86* 3.60* 3.63*  HGB 11.2* 10.6* 10.5*  HCT 37.8 34.8* 35.1*  MCV 97.9 96.7 96.7  MCH 29.0 29.4 28.9  MCHC 29.6* 30.5 29.9*  RDW 15.9* 16.0* 15.9*  PLT 181 157 148*   Thyroid  No results for input(s): TSH, FREET4 in the last 168 hours.  BNP Recent Labs  Lab 12/08/23 1808  PROBNP 2,590.0*    DDimer No results for input(s): DDIMER in the last 168 hours.   Radiology  No results found.    Assessment & Plan   1.Chronic HFpEF/Mod RV dysfunction -11/2023 echo: LVEF 55-60% no WMAs, indet diastolic function, D shaped septum, mod RV dysfucntion, PASP 40, severe BAE, mild MR, mild paravalvular leak TAVR valve - Presented with DOE, nocturnal cough, abdominal distention, pitting edema in the legs for the last few weeks  - CXR pulm congestion and edema. ProBNP 2590 -possibly exacerbated by recurrent afib with RVR.    - she is on IV lasix  80mg  tid though AM dose was held for soft bp's. I/Os report negative 1.3 L yesterday and negative 5.2 L since admission. Wt 144-->134 lbs. Wt 9/20-25 clinic visit 138 lbs, at July 2025 cards appt 133 lbs. Renal function stable. Some soft bp's yesterday.  - she is on jardiance 10mg  daily.  - diuresis limited by soft bp's, midodrine started  yesterday. Hold diuretics today, allow volume tertiary fluid to mobilize to intravascular space. Check lactic acid, hepatic panel.      2.CAD - s/p CABG (LIMA-LAD, SVG-LCX, SVG-Diag) after presenting 11/2016 with NSTEMI. Presentation complicated by cardiogenic shock  - 05/2020 cath received PCI to prox to mid RCA      3.Aortic stenosis - s/p TAVR 07/2020  - 06/2023 echo: Mild paravalvular leak. Aortic valve area, by VTI measures 1.83 cm. Aortic valve mean gradient measures  7.0 mmHg.     4. Afib - prior admission with afib with RVR in setting of pneumonia/sepsis -previously  not on anticoag due to isolated occurrence, also had prior SDH when on eliquis  for PE in the past. This was 10/2019 admission in setting of multiple falls at home, from notes on multiple sedatives at home - recurrent afib this admission. With recurrent afib started on anticoagulation, prior SDH was traumatic and not spontaneous.   -Currently on primidone.  DOACs contraindicated.  Start warfarin, pharmacy consulted.  Bridged with subcutaneous Lovenox  1 mg/kg twice daily.  -INR is >2, stopped lovenox .  - has been paroxysmal afib this admission. Rate controlled with lopressor  12.5mg  bid.   5. History of COPD on 3L at home  6.Pulmonary HTN - 05/2020 RHC: mean PA 31, PCWP 5 - -11/2023 echo: LVEF 55-60% no WMAs, indet diastolic function, D shaped septum, mod RV dysfucntion, PASP 40, severe BAE, mild MR, mild paravalvular leak TAVR valve - likely group II (HFpEF) and III (COPD) pulmonary HTN - has history of prior PE, concider VQ at some point to see if component of CTEPH   For questions or updates, please contact  [facility] Please consult [email or web address] for contact info under       Signed, Alvan Carrier, MD  12/13/2023, 9:19 AM

## 2023-12-14 ENCOUNTER — Inpatient Hospital Stay (HOSPITAL_COMMUNITY)

## 2023-12-14 DIAGNOSIS — I5033 Acute on chronic diastolic (congestive) heart failure: Secondary | ICD-10-CM | POA: Diagnosis not present

## 2023-12-14 DIAGNOSIS — J90 Pleural effusion, not elsewhere classified: Secondary | ICD-10-CM | POA: Diagnosis not present

## 2023-12-14 DIAGNOSIS — R0602 Shortness of breath: Secondary | ICD-10-CM | POA: Diagnosis not present

## 2023-12-14 LAB — PROTIME-INR
INR: 2.4 — ABNORMAL HIGH (ref 0.8–1.2)
Prothrombin Time: 27.4 s — ABNORMAL HIGH (ref 11.4–15.2)

## 2023-12-14 LAB — BASIC METABOLIC PANEL WITH GFR
Anion gap: 3 — ABNORMAL LOW (ref 5–15)
BUN: 11 mg/dL (ref 8–23)
CO2: 39 mmol/L — ABNORMAL HIGH (ref 22–32)
Calcium: 9.1 mg/dL (ref 8.9–10.3)
Chloride: 91 mmol/L — ABNORMAL LOW (ref 98–111)
Creatinine, Ser: 0.6 mg/dL (ref 0.44–1.00)
GFR, Estimated: >60 mL/min (ref >60)
Glucose, Bld: 101 mg/dL — ABNORMAL HIGH (ref 70–99)
Potassium: 4.2 mmol/L (ref 3.5–5.1)
Sodium: 133 mmol/L — ABNORMAL LOW (ref 135–145)

## 2023-12-14 LAB — LACTIC ACID, PLASMA: Lactic Acid, Venous: 0.5 mmol/L (ref 0.5–1.9)

## 2023-12-14 MED ORDER — FUROSEMIDE 10 MG/ML IJ SOLN
80.0000 mg | Freq: Two times a day (BID) | INTRAMUSCULAR | Status: AC
Start: 2023-12-14 — End: 2023-12-14
  Administered 2023-12-14 (×2): 80 mg via INTRAVENOUS
  Filled 2023-12-14 (×2): qty 8

## 2023-12-14 MED ORDER — IOHEXOL 350 MG/ML SOLN
75.0000 mL | Freq: Once | INTRAVENOUS | Status: AC | PRN
  Administered 2023-12-14: 75 mL via INTRAVENOUS

## 2023-12-14 MED ORDER — WARFARIN SODIUM 2.5 MG PO TABS
2.5000 mg | ORAL_TABLET | Freq: Once | ORAL | Status: AC
  Administered 2023-12-14: 2.5 mg via ORAL
  Filled 2023-12-14: qty 1

## 2023-12-14 NOTE — Progress Notes (Signed)
 PHARMACY - ANTICOAGULATION CONSULT NOTE  Pharmacy Consult for warfarin Indication: atrial fibrillation  Allergies  Allergen Reactions   Levaquin  [Levofloxacin ] Swelling    Patient Measurements: Height: 5' 4 (162.6 cm) Weight: 62.1 kg (136 lb 14.5 oz) IBW/kg (Calculated) : 54.7 HEPARIN  DW (KG): 66.8  Vital Signs: Temp: 98.1 F (36.7 C) (10/29 0343) Temp Source: Oral (10/29 0343) BP: 128/55 (10/29 0343) Pulse Rate: 81 (10/29 0343)  Labs: Recent Labs    12/12/23 0518 12/13/23 0455 12/14/23 0503  HGB 10.5*  --   --   HCT 35.1*  --   --   PLT 148*  --   --   LABPROT 23.2* 27.5* 27.4*  INR 2.0* 2.4* 2.4*  CREATININE 0.58 0.61 0.60    Estimated Creatinine Clearance: 56.5 mL/min (by C-G formula based on SCr of 0.6 mg/dL).   Medical History: Past Medical History:  Diagnosis Date   Anxiety    Asthma    COPD (chronic obstructive pulmonary disease) (HCC)    Coronary artery disease    a. s/p NSTEMI in 11/2016 and required CABG with LIMA-LAD, SVG-LCx and SVG-D1 and complicated by cardiogenic shock b. cath in 05/2020 showing severe RCA stenosis and treated with orbital atherectomy and stent placement   GERD (gastroesophageal reflux disease)    History of CVA (cerebrovascular accident)    History of lung cancer 2020   Peripheral vascular disease    S/P TAVR (transcatheter aortic valve replacement) 08/12/2020   s/p TAVR with a 23 mm Edwards S3U via the TF approach by Dr. Verlin & Dr. Lucas   Severe aortic stenosis     Medications:  Medications Prior to Admission  Medication Sig Dispense Refill Last Dose/Taking   albuterol  (PROVENTIL ) (2.5 MG/3ML) 0.083% nebulizer solution Take 2.5 mg by nebulization every 4 (four) hours as needed for wheezing or shortness of breath.   12/08/2023 Morning   albuterol  (VENTOLIN  HFA) 108 (90 Base) MCG/ACT inhaler Inhale 1-2 puffs into the lungs every 6 (six) hours as needed for wheezing or shortness of breath.   12/08/2023 Noon    ALPRAZolam  (XANAX ) 0.5 MG tablet Take 0.5 mg by mouth 3 (three) times daily.   12/08/2023 Evening   Ascorbic Acid (VITAMIN C GUMMIE PO) Take 400 mg by mouth daily.   12/08/2023 Morning   aspirin  EC 81 MG tablet Take 81 mg by mouth daily. Swallow whole.   12/08/2023 Bedtime   atorvastatin  (LIPITOR ) 40 MG tablet Take 1 tablet (40 mg total) daily by mouth. 30 tablet 1 12/08/2023 Morning   azithromycin  (ZITHROMAX ) 250 MG tablet Take 1 tablet (250 mg total) by mouth 3 (three) times a week. 36 tablet 6 Past Week   diphenhydramine-acetaminophen  (TYLENOL  PM) 25-500 MG TABS tablet Take 2 tablets by mouth at bedtime.   12/08/2023 Bedtime   Fluticasone-Umeclidin-Vilant (TRELEGY ELLIPTA) 100-62.5-25 MCG/ACT AEPB Inhale 1 Inhalation into the lungs daily. 3 each 6 12/08/2023 Morning   ibuprofen (ADVIL) 200 MG tablet Take 400 mg by mouth every 6 (six) hours as needed for moderate pain (pain score 4-6).   12/08/2023 Morning   ipratropium-albuterol  (DUONEB) 0.5-2.5 (3) MG/3ML SOLN One in am and one at supper time 180 mL 11 Past Week   magnesium  oxide (MAG-OX) 400 (240 Mg) MG tablet Take 400 mg by mouth daily.   12/08/2023 Morning   metoprolol  succinate (TOPROL -XL) 25 MG 24 hr tablet TAKE 0.5 TABLETS (12.5 MG TOTAL) BY MOUTH DAILY 45 tablet 3 12/08/2023 Morning   nystatin (MYCOSTATIN) 100000 UNIT/ML suspension Take 5  mLs by mouth 4 (four) times daily.   Past Month   pantoprazole  (PROTONIX ) 40 MG tablet Take 40 mg by mouth daily.   12/08/2023 Morning   potassium chloride  SA (KLOR-CON ) 20 MEQ tablet Take 20 mEq by mouth in the morning.   12/08/2023 Morning   torsemide  (DEMADEX ) 20 MG tablet Take 40 mg by mouth as needed (based on her weight).   12/08/2023 Morning   TRELEGY ELLIPTA 100-62.5-25 MCG/ACT AEPB Inhale 1 puff into the lungs daily.   12/08/2023 Morning   budesonide  (PULMICORT ) 0.25 MG/2ML nebulizer solution One vial with duonbe twice daily (Patient not taking: Reported on 12/09/2023) 120 mL 12 Not Taking    primidone (MYSOLINE) 50 MG tablet Take 50 mg by mouth daily.       Assessment: Pharmacy consulted to dose warfarin in patient with atrial fibrillation.  Patient is primidone prior to admission which is contraindicated with Eliquis - could consider Eliquis  in future if patient stops taking primidone.   INR 1.2> 1.7>2.0>2.4>2.4 CBC WNL  Goal of Therapy:  INR 2-3 Monitor platelets by anticoagulation protocol: Yes   Plan:  Warfarin 2.5 mg x 1 dose Monitor daily INR and s/s of bleeding.  Elspeth Sour, PharmD Clinical Pharmacist 12/14/2023 8:12 AM

## 2023-12-14 NOTE — Plan of Care (Signed)
  Problem: Acute Rehab PT Goals(only PT should resolve) Goal: Patient Will Perform Sitting Balance Outcome: Progressing Flowsheets (Taken 12/14/2023 1350) Patient will perform sitting balance: Independently Note: Wean off O2 Goal: Patient Will Transfer Sit To/From Stand Outcome: Progressing Flowsheets (Taken 12/14/2023 1350) Patient will transfer sit to/from stand: Independently Note: Wean off O2 Goal: Pt Will Transfer Bed To Chair/Chair To Bed Outcome: Progressing Flowsheets (Taken 12/14/2023 1350) Pt will Transfer Bed to Chair/Chair to Bed: Independently Note: Wean off O2 Goal: Pt Will Perform Standing Balance Or Pre-Gait Outcome: Progressing Flowsheets (Taken 12/14/2023 1350) Pt will perform standing balance or pre-gait: Independently Note: Wean off O2 Goal: Pt Will Ambulate Outcome: Progressing Flowsheets (Taken 12/14/2023 1350) Pt will Ambulate:  100 feet  > 125 feet  Independently Note: Wean off O2   Bryli Mantey, SPT

## 2023-12-14 NOTE — Progress Notes (Addendum)
 Progress Note   [name] FMW:[id number] DOB: September 19, 1955 DOA: 12/08/2023     5 DOS: the patient was seen and examined on 12/14/2023    Subjective: The patient was seen and examined this morning, stable no acute distress Still reporting lower extremity edema, on 3 L of oxygen , satting 94% Blood pressure stable, on midodrine, Binora therapeutic at 2.4,    Brief hospital admission narrative: As per H&P written by Dr. Lawence on 12/09/2023 Ms. RANETTA ARMACOST is a 68 y.o. Caucasian female with medical history significant for COPD, asthma, anxiety, coronary artery disease status post CABG, peripheral vascular disease and TAVR, who presented to the emergency room with acute onset of palpitations, without chest pain.  She described it as a feeling of heart fluttering.  She admits to dyspnea on exertion without orthopnea or paroxysmal nocturnal dyspnea.  She denied any cough or wheezing.  No fever or chills.  No nausea or vomiting or abdominal pain.  No dysuria, oliguria or hematuria or flank pain.   ED Course: Upon presentation to the emergency room, BP was 136/120 with heart rate 102 and respiratory to 24 pulse oximetry 94% on room air.  Labs revealed CO2 of 40 with chloride 93 and otherwise unremarkable BMP.  proBNP was 2590 and high sensitive troponin I was 19 and later 17.  CBC showed hemoglobin 11.2 hematocrit 37.8. EKG as reviewed by me : EKG showed atrial fibrillation with rapid ventricular sponsor 138, right axis deviation and prolonged QT interval with QTc 511 MS.. Imaging: 2 view chest x-ray showed cardiomegaly with vascular congestion and edema as well as small right pleural effusion and right lung base atelectasis/infiltrate.   ==================================================================================== Assessment and Plan:  * Acute on chronic diastolic CHF (congestive heart failure) (HCC) - Shortness of breath, lower extremity edema - Echocardiogram with preserved LV  systolic function with EF 55 to 60%, mild LVH, interventricular septum flattened in systole and diastole, RV systolic function with moderate reduction, RVSP 39,8 mmHg, LA and RA with severe dilatation, moderate tricuspid regurgitation, sp TAVR. -Acute on chronic cor pulmonale  -Pulmonary hypertension, likely group 3  -RV failure with overall poor prognosis.   - Spironolactone  and SGLT2 inhibitor has been added  - Continue midodrine 5 mg p.o. 3 times daily, -Initiated IV Lasix  80 mg x 2 today and reassess in a.m. Continue Jardiance 10 mg p.o. daily  -Continue TED hose, SCDs -Chest x-ray-consider pulmonary congestion, elevated BNP 2590,  Intake/Output Summary (Last 24 hours) at 12/14/2023 1242 Last data filed at 12/14/2023 0300 Gross per 24 hour  Intake 240 ml  Output 500 ml  Net -260 ml   Wt  144 >> 136 lbs   Filed Weights   12/12/23 0500 12/13/23 0338 12/14/23 0343  Weight: 63.1 kg 61.1 kg 62.1 kg     CAD (coronary artery disease)/Prior CABG -No acute coronary syndrome.  -Continue blood pressure monitoring   Paroxysmal atrial fibrillation with RVR (HCC) -Continue rate control with metoprolol .  -Anticoagulation with warfarin --monitoring INR  Chronic obstructive pulmonary disease (COPD) (HCC) -Continue bronchodilator therapy -Continue inhaled corticosteroids.  -No signs of acute exacerbation .  Dyslipidemia -Continue statin.  Anxiety -Continue with alprazolam .   History of pulmonary embolism  Cardiology recommendation, reevaluating try to obtain CTA to rule out PE -On Coumadin , INR therapeutic     GERD without esophagitis -Continue PPI.     Physical Exam: Vitals:   12/13/23 2011 12/13/23 2206 12/14/23 0343 12/14/23 0723  BP:  (!) 129/51 (!) 128/55  Pulse:  77 81   Resp:   20   Temp:   98.1 F (36.7 C)   TempSrc:   Oral   SpO2: 95% 97% 95% 94%  Weight:   62.1 kg   Height:            General:  AAO x 3,  cooperative, no distress;   HEENT:   Normocephalic, PERRL, otherwise with in Normal limits   Neuro:  CNII-XII intact. , normal motor and sensation, reflexes intact   Lungs:   Clear to auscultation BL, Respirations unlabored,  No wheezes / crackles  Cardio:    S1/S2, RRR, No murmure, No Rubs or Gallops   Abdomen:  Soft, non-tender, bowel sounds active all four quadrants, no guarding or peritoneal signs.  Muscular  skeletal:  Limited exam -global generalized weaknesses - in bed, able to move all 4 extremities,   2+ pulses,  symmetric, +2  pitting edema  Skin:  Dry, warm to touch, negative for any Rashes,  Wounds: Please see nursing documentation         INR/Prothrombin Time . Latest data Reviewed: Lab Results  Component Value Date   CREATININE 0.60 12/14/2023   CREATININE 0.61 12/13/2023   CREATININE 0.58 12/12/2023  chondromalacia patella    Latest Ref Rng & Units 12/14/2023    5:03 AM 12/13/2023    9:53 AM 12/13/2023    4:55 AM  CMP  Glucose 70 - 99 mg/dL 898   92   BUN 8 - 23 mg/dL 11   10   Creatinine 9.55 - 1.00 mg/dL 9.39   9.38   Sodium 864 - 145 mmol/L 133   136   Potassium 3.5 - 5.1 mmol/L 4.2   3.7   Chloride 98 - 111 mmol/L 91   91   CO2 22 - 32 mmol/L 39   40   Calcium  8.9 - 10.3 mg/dL 9.1   8.5   Total Protein 6.5 - 8.1 g/dL  6.4    Total Bilirubin 0.0 - 1.2 mg/dL  0.5    Alkaline Phos 38 - 126 U/L  146    AST 15 - 41 U/L  25    ALT 0 - 44 U/L  13     CBC    Component Value Date/Time   WBC 5.8 12/12/2023 0518   RBC 3.63 (L) 12/12/2023 0518   HGB 10.5 (L) 12/12/2023 0518   HGB 11.5 06/09/2020 1140   HCT 35.1 (L) 12/12/2023 0518   HCT 38.1 06/09/2020 1140   PLT 148 (L) 12/12/2023 0518   PLT 225 06/09/2020 1140   MCV 96.7 12/12/2023 0518   MCV 76 (L) 06/09/2020 1140   MCH 28.9 12/12/2023 0518   MCHC 29.9 (L) 12/12/2023 0518   RDW 15.9 (H) 12/12/2023 0518   RDW 16.4 (H) 06/09/2020 1140   LYMPHSABS 1.4 08/08/2023 0953   MONOABS 0.7 08/08/2023 0953   EOSABS 0.0 08/08/2023 0953    BASOSABS 0.1 08/08/2023 0953      Family Communication: Husband at bedside.  Disposition: Status is: Inpatient Remains inpatient appropriate because: recovering heart failure    Planned Discharge Destination: Home in next 24-48 hours   Time spent 55 minutes -evaluating patient, reviewing labs, meds, coordinating plan of care with cardiology   author: Adriana DELENA Grams, MD 12/14/2023 12:37 PM  For on call review [email or web address].

## 2023-12-14 NOTE — Evaluation (Signed)
 Physical Therapy Evaluation Patient Details Name: Emily Rocha MRN: [id number] DOB: 11/10/1955 Today's Date: 12/14/2023  History of Present Illness  Emily Rocha is a 68 y.o. Caucasian female with medical history significant for COPD, asthma, anxiety, coronary artery disease status post CABG, peripheral vascular disease and TAVR, who presented to the emergency room with acute onset of palpitations, without chest pain.  She described it as a feeling of heart fluttering.  She admits to dyspnea on exertion without orthopnea or paroxysmal nocturnal dyspnea.  She denied any cough or wheezing.  No fever or chills.  No nausea or vomiting or abdominal pain.  No dysuria, oliguria or hematuria or flank pain.    Clinical Impression  Pt. Presented with general weakness, and exhaustion secondary to CHF, Pt. Uses [place] (3L) during treatment, and at home. Pt was able to do bed mobility, transfer from sit to stand, and ambulation independently while monitoring SpO2. During the ambulation, SpO2 dropped from 88% to 77%, pt. Felt fatigue. Pt. Was left in chair after ambulation to help with breath control, SpO2 returned to 89%. Nursing staff was notified on pt. Status. Patient will benefit from continued skilled physical therapy in hospital and recommended venue below to increase strength, balance, endurance for safe ADLs and gait.       If plan is discharge home, recommend the following: Help with stairs or ramp for entrance;A little help with walking and/or transfers   Can travel by private vehicle    Yes    Equipment Recommendations None recommended by PT  Recommendations for Other Services       Functional Status Assessment Patient has had a recent decline in their functional status and demonstrates the ability to make significant improvements in function in a reasonable and predictable amount of time.     Precautions / Restrictions Precautions Precautions: Fall Recall of Precautions/Restrictions:  Intact Restrictions Weight Bearing Restrictions Per Provider Order: No      Mobility  Bed Mobility Overal bed mobility: Modified Independent, Independent             General bed mobility comments: EOB labored    Transfers Overall transfer level: Independent Equipment used: None               General transfer comment: sit and stand independent, Pt. SpO2 did decrease from 95% to 90% when standing.    Ambulation/Gait Ambulation/Gait assistance: Independent, Modified independent (Device/Increase time) Gait Distance (Feet): 55 Feet Assistive device: None Gait Pattern/deviations: Step-through pattern, Decreased step length - right, Decreased step length - left, Decreased stance time - right, Decreased stance time - left, Decreased stride length       General Gait Details: Pt. was able to ambulate with [place] 3L, pt. SpO2 dropped to 77% with ambulation and patient very fatigued, pt  returned to room to so they could catch their breath, and SpO2 when back to 90%  Stairs            Wheelchair Mobility     Tilt Bed    Modified Rankin (Stroke Patients Only)       Balance Overall balance assessment: Modified Independent, Independent                                           Pertinent Vitals/Pain Pain Assessment Pain Assessment: 0-10 Pain Score: 5  Pain Location: R. Lung, and R. Shoulder Pain  Descriptors / Indicators: Discomfort Pain Intervention(s): Repositioned, Monitored during session    Home Living Family/patient expects to be discharged to:: Private residence Living Arrangements: Spouse/significant other Available Help at Discharge: Available 24 hours/day;Family Type of Home: House Home Access: Stairs to enter Entrance Stairs-Rails: Can reach both;Left;Right Entrance Stairs-Number of Steps: 1   Home Layout: One level Home Equipment: [employer] (2 wheels);Cane - single point Additional Comments: Pt. mentions home is set up at  handicap safe.    Prior Function Prior Level of Function : Needs assist;Driving;Independent/Modified Independent       Physical Assist : Mobility (physical);ADLs (physical) Mobility (physical): Gait;Stairs   Mobility Comments: No AD, Pt does use near by surfaces during ambulation to help with balance. ADLs Comments: Pt. husband helps with groceries/shopping, able to manage IADLs     Extremity/Trunk Assessment        Lower Extremity Assessment Lower Extremity Assessment: Overall WFL for tasks assessed    Cervical / Trunk Assessment Cervical / Trunk Assessment: Normal  Communication   Communication Communication: No apparent difficulties Factors Affecting Communication: Hearing impaired;Other (comment) (Pt. wears hearing aides)    Cognition Arousal: Alert Behavior During Therapy: WFL for tasks assessed/performed   PT - Cognitive impairments: No apparent impairments                         Following commands: Intact       Cueing Cueing Techniques: Verbal cues, Tactile cues     General Comments      Exercises     Assessment/Plan    PT Assessment Patient needs continued PT services  PT Problem List Decreased strength;Decreased range of motion;Decreased activity tolerance;Decreased balance;Decreased mobility;Cardiopulmonary status limiting activity       PT Treatment Interventions DME instruction;Gait training;Stair training;Functional mobility training;Therapeutic activities;Therapeutic exercise;Balance training;Patient/family education    PT Goals (Current goals can be found in the Care Plan section)  Acute Rehab PT Goals Patient Stated Goal: Pt. wants HH to improve endurance PT Goal Formulation: With patient Time For Goal Achievement: 12/22/23 Potential to Achieve Goals: Good    Frequency Min 3X/week     Co-evaluation               AM-PAC PT 6 Clicks Mobility  Outcome Measure Help needed turning from your back to your side while  in a flat bed without using bedrails?: None Help needed moving from lying on your back to sitting on the side of a flat bed without using bedrails?: None Help needed moving to and from a bed to a chair (including a wheelchair)?: None Help needed standing up from a chair using your arms (e.g., wheelchair or bedside chair)?: None Help needed to walk in hospital room?: A Little Help needed climbing 3-5 steps with a railing? : A Little 6 Click Score: 22    End of Session Equipment Utilized During Treatment: Gait belt Activity Tolerance: Patient tolerated treatment well Patient left: in chair;with call bell/phone within reach Nurse Communication: Mobility status PT Visit Diagnosis: Unsteadiness on feet (R26.81);Repeated falls (R29.6);Muscle weakness (generalized) (M62.81)    Time: 8945-8871 PT Time Calculation (min) (ACUTE ONLY): 34 min   Charges:   PT Evaluation $PT Eval Moderate Complexity: 1 Mod PT Treatments $Therapeutic Activity: 23-37 mins PT General Charges $$ ACUTE PT VISIT: 1 Visit        Farrin Shadle, SPT

## 2023-12-14 NOTE — Progress Notes (Addendum)
 Rounding Note   Patient Name: Emily Rocha Date of Encounter: 12/14/2023  [facility] Cardiologist: Alvan Carrier, MD   Subjective Ongoing SOB  Scheduled Meds:  ALPRAZolam   0.5 mg Oral TID   ascorbic acid  500 mg Oral Daily   atorvastatin   40 mg Oral Daily   budesonide   0.25 mg Nebulization Daily   Chlorhexidine  Gluconate Cloth  6 each Topical Daily   empagliflozin  10 mg Oral Daily   ipratropium-albuterol   3 mL Nebulization BID   magnesium  oxide  400 mg Oral Daily   metoprolol  tartrate  12.5 mg Oral BID   midodrine  5 mg Oral TID WC   mupirocin  ointment  1 Application Nasal BID   neomycin-bacitracin-polymyxin   Topical TID   pantoprazole   40 mg Oral Daily   primidone  50 mg Oral Daily   warfarin  2.5 mg Oral ONCE-1600   Warfarin - Pharmacist Dosing Inpatient   Does not apply q1600   Continuous Infusions:  PRN Meds: acetaminophen  **OR** acetaminophen , HYDROcodone bit-homatropine, ipratropium-albuterol , magnesium  hydroxide, ondansetron  **OR** ondansetron  (ZOFRAN ) IV, oxyCODONE , traZODone   Vital Signs  Vitals:   12/13/23 2011 12/13/23 2206 12/14/23 0343 12/14/23 0723  BP:  (!) 129/51 (!) 128/55   Pulse:  77 81   Resp:   20   Temp:   98.1 F (36.7 C)   TempSrc:   Oral   SpO2: 95% 97% 95% 94%  Weight:   62.1 kg   Height:        Intake/Output Summary (Last 24 hours) at 12/14/2023 0830 Last data filed at 12/14/2023 0300 Gross per 24 hour  Intake 240 ml  Output 500 ml  Net -260 ml      12/14/2023    3:43 AM 12/13/2023    3:38 AM 12/12/2023    5:00 AM  Last 3 Weights  Weight (lbs) 136 lb 14.5 oz 134 lb 11.2 oz 139 lb 1.8 oz  Weight (kg) 62.1 kg 61.1 kg 63.1 kg      Telemetry SR, PACs - Personally Reviewed  ECG  N/A - Personally Reviewed  Physical Exam GEN: No acute distress.   Neck: + JVD Cardiac: RRR, no murmurs, rubs, or gallops.  Respiratory: crackles bilaterally GI: Soft, nontender, non-distended  MS: 2+ bilateral LE  edema Neuro:  Nonfocal  Psych: Normal affect   Labs High Sensitivity Troponin:  No results for input(s): TROPONINIHS in the last 720 hours.   Chemistry Recent Labs  Lab 12/11/23 0451 12/12/23 0518 12/13/23 0455 12/13/23 0953 12/14/23 0503  NA 136 138 136  --  133*  K 4.2 3.8 3.7  --  4.2  CL 91* 92* 91*  --  91*  CO2 40* 42* 40*  --  39*  GLUCOSE 85 85 92  --  101*  BUN 12 10 10   --  11  CREATININE 0.60 0.58 0.61  --  0.60  CALCIUM  8.8* 8.9 8.5*  --  9.1  MG 2.1 2.1  --   --   --   PROT  --   --   --  6.4*  --   ALBUMIN   --   --   --  3.7  --   AST  --   --   --  25  --   ALT  --   --   --  13  --   ALKPHOS  --   --   --  146*  --   BILITOT  --   --   --  0.5  --   GFRNONAA >60 >60 >60  --  >60  ANIONGAP 5 4* 5  --  3*    Lipids No results for input(s): CHOL, TRIG, HDL, LABVLDL, LDLCALC, CHOLHDL in the last 168 hours.  Hematology Recent Labs  Lab 12/08/23 1808 12/09/23 0459 12/12/23 0518  WBC 5.8 5.7 5.8  RBC 3.86* 3.60* 3.63*  HGB 11.2* 10.6* 10.5*  HCT 37.8 34.8* 35.1*  MCV 97.9 96.7 96.7  MCH 29.0 29.4 28.9  MCHC 29.6* 30.5 29.9*  RDW 15.9* 16.0* 15.9*  PLT 181 157 148*   Thyroid  No results for input(s): TSH, FREET4 in the last 168 hours.  BNP Recent Labs  Lab 12/08/23 1808  PROBNP 2,590.0*    DDimer No results for input(s): DDIMER in the last 168 hours.   Radiology  No results found.    Assessment & Plan  1.Chronic HFpEF/Mod RV dysfunction -11/2023 echo: LVEF 55-60% no WMAs, indet diastolic function, D shaped septum, mod RV dysfucntion, PASP 40, severe BAE, mild MR, mild paravalvular leak TAVR valve - Presented with DOE, nocturnal cough, abdominal distention, pitting edema in the legs for the last few weeks  - CXR pulm congestion and edema. ProBNP 2590 -possibly exacerbated by recurrent afib with RVR.     Diuretics held yesterday due to soft bp's. I/Os incomplete for admission, Wt 144-->136 lbs. Wt 11/05/23 clinic visit 138  lbs, at July 2025 cards appt 133 lbs. Renal function is stable.  - lactic acid normal x 2.  - bps 120s/50s this AM, she was started on midodrine 5mg  tid as well.  - remains fluid overloaded. With improved bp's will dose IV lasix  80mg  x 2 today, reassess diuretic dosing tomorrow, have not scheduled dosing.  - she is on jardiance 10mg  daily.     2.CAD - s/p CABG (LIMA-LAD, SVG-LCX, SVG-Diag) after presenting 11/2016 with NSTEMI. Presentation complicated by cardiogenic shock  - 05/2020 cath received PCI to prox to mid RCA      3.Aortic stenosis - s/p TAVR 07/2020  - 06/2023 echo: Mild paravalvular leak. Aortic valve area, by VTI measures 1.83 cm. Aortic valve mean gradient measures  7.0 mmHg.     4. Afib - prior admission with afib with RVR in setting of pneumonia/sepsis -previously  not on anticoag due to isolated occurrence, also had prior SDH when on eliquis  for PE in the past. This was 10/2019 admission in setting of multiple falls at home, from notes on multiple sedatives at home - recurrent afib this admission. With recurrent afib started on anticoagulation, prior SDH was traumatic and not spontaneous.   -Currently on primidone.  DOACs contraindicated.  Start warfarin, pharmacy consulted.  Bridged with subcutaneous Lovenox  1 mg/kg twice daily.  -INR is >2, stopped lovenox .  - has been paroxysmal afib this admission. Rate controlled with lopressor  12.5mg  bid.     5. History of COPD on 3L at home    6.Pulmonary HTN - 05/2020 RHC: mean PA 31, PCWP 5 - -11/2023 echo: LVEF 55-60% no WMAs, indet diastolic function, D shaped septum, mod RV dysfucntion, PASP 40, severe BAE, mild MR, mild paravalvular leak TAVR valve - likely group II (HFpEF) and III (COPD) pulmonary HTN - has history of prior PE, concider VQ at some point to see if component of CTEPH     Please contact [facility] Please consult [email or web address] for contact info under       Signed, Alvan Carrier, MD   12/14/2023, 8:30 AM

## 2023-12-14 NOTE — Plan of Care (Signed)

## 2023-12-14 NOTE — TOC Initial Note (Signed)
 Transition of Care [facility]) - Initial/Assessment Note    Patient Details  Name: Emily Rocha MRN: [id number] Date of Birth: 08-03-1955  [facility]) CM/SW Contact:    Lucie Lunger, LCSWA Phone Number: 12/14/2023, 2:41 PM  Clinical Narrative:                 CSW updated that PT is recommending HH PT for pt at D/C. CSW spoke with pt about this, she is agreeable to [facility] PT at this time and does not have an agency preference. CSW spoke to Cory with [facility] who states they are able to accept referral. CSW requested MD place [facility] orders. TOC to follow.    Barriers to Discharge: Continued Medical Work up   Patient Goals and CMS Choice Patient states their goals for this hospitalization and ongoing recovery are:: return home CMS Medicare.gov Compare Post Acute Care list provided to:: Patient Choice offered to / list presented to : Patient      Expected Discharge Plan and Services In-house Referral: Clinical Social Work Discharge Planning Services: CM Consult Post Acute Care Choice: Home Health Living arrangements for the past 2 months: Single Family Home                           HH Arranged: PT HH Agency: [facility] Date [facility] Agency Contacted: 12/14/23   Representative spoke with at [facility] Agency: Darleene  Prior Living Arrangements/Services Living arrangements for the past 2 months: Single Family Home Lives with:: Spouse Patient language and need for interpreter reviewed:: Yes Do you feel safe going back to the place where you live?: Yes      Need for Family Participation in Patient Care: Yes (Comment) Care giver support system in place?: Yes (comment) Current home services: DME Criminal Activity/Legal Involvement Pertinent to Current Situation/Hospitalization: No - Comment as needed  Activities of Daily Living   ADL Screening (condition at time of admission) Independently performs ADLs?: Yes (appropriate for developmental age) Is the patient deaf or have  difficulty hearing?: Yes Does the patient have difficulty seeing, even when wearing glasses/contacts?: No Does the patient have difficulty concentrating, remembering, or making decisions?: No  Permission Sought/Granted                  Emotional Assessment Appearance:: Appears stated age Attitude/Demeanor/Rapport: Engaged Affect (typically observed): Accepting Orientation: : Oriented to Self, Oriented to Place, Oriented to  Time, Oriented to Situation Alcohol  / Substance Use: Not Applicable Psych Involvement: No (comment)  Admission diagnosis:  Acute on chronic diastolic CHF (congestive heart failure) (HCC) [I50.33] Atrial fibrillation, unspecified type (HCC) [I48.91] Acute on chronic congestive heart failure, unspecified heart failure type [facility]) [I50.9] Patient Active Problem List   Diagnosis Date Noted   Acute on chronic diastolic CHF (congestive heart failure) (HCC) 12/09/2023   Paroxysmal atrial fibrillation with RVR (HCC) 12/09/2023   GERD without esophagitis 12/09/2023   Chronic obstructive pulmonary disease (COPD) (HCC) 12/09/2023   Dyslipidemia 12/09/2023   Anxiety 12/09/2023   Acute on chronic respiratory failure (HCC) 08/08/2023   Chronic respiratory failure with hypoxia and hypercapnia (HCC) 03/28/2022   Acute respiratory failure with hypercapnia (HCC) 03/28/2022   Elevated LFTs 03/28/2022   Elevated troponin 03/28/2022   Hyperkalemia 03/28/2022   Sepsis with acute hypoxic respiratory failure and septic shock (HCC) 03/28/2022   Pneumonia of right lower lobe due to infectious organism 03/28/2022   History of CVA (cerebrovascular accident) 08/12/2020  COPD GOLD 3 / 08/12/2020   S/P TAVR (transcatheter aortic valve replacement) 08/12/2020   CAD S/P percutaneous coronary angioplasty 06/11/2020   Severe aortic stenosis 05/19/2020   Chronic respiratory failure with hypoxia (HCC) 11/13/2019   Chronic diastolic CHF (congestive heart failure) (HCC) 11/13/2019    Subdural hematoma (HCC) 11/12/2019   Lip laceration 11/12/2019   Hypoalbuminemia 11/12/2019   Hiatal hernia 11/02/2019   History of lung cancer- Non-Small cell/S/p Prior VATs 10/24/2019   CAD (coronary artery disease)/Prior CABG 10/24/2019   S/P lobectomy of lung 10/26/2018   S/P CABG x 3 12/14/2016   COPD exacerbation (HCC) 12/12/2016   GERD (gastroesophageal reflux disease) 12/12/2016   Carotid stenosis 12/19/2014   PCP:  Rosamond Leta NOVAK, MD Pharmacy:   [facility] - [place], [place] - [address] [place] [address] Phone: [phone] Fax: [phone]  [facility] [phone] - [place], [place] - [address] AT [facility] [name] [place] [address] Phone: [phone] Fax: [phone]     Social Drivers of Health (SDOH) Social History: SDOH Screenings   Food Insecurity: No Food Insecurity (12/09/2023)  Housing: Low Risk  (12/09/2023)  Transportation Needs: No Transportation Needs (12/09/2023)  Utilities: Not At Risk (12/09/2023)  Depression (PHQ2-9): Low Risk  (10/28/2020)  Social Connections: Socially Integrated (12/09/2023)  Tobacco Use: Medium Risk (12/08/2023)   SDOH Interventions:     Readmission Risk Interventions    12/09/2023   11:48 AM  Readmission Risk Prevention Plan  Medication Screening Complete  Transportation Screening Complete

## 2023-12-15 ENCOUNTER — Ambulatory Visit: Admitting: Pulmonary Disease

## 2023-12-15 ENCOUNTER — Inpatient Hospital Stay (HOSPITAL_COMMUNITY)

## 2023-12-15 DIAGNOSIS — I5033 Acute on chronic diastolic (congestive) heart failure: Secondary | ICD-10-CM | POA: Diagnosis not present

## 2023-12-15 DIAGNOSIS — I25810 Atherosclerosis of coronary artery bypass graft(s) without angina pectoris: Secondary | ICD-10-CM | POA: Diagnosis not present

## 2023-12-15 DIAGNOSIS — I4891 Unspecified atrial fibrillation: Secondary | ICD-10-CM | POA: Diagnosis not present

## 2023-12-15 LAB — PRO BRAIN NATRIURETIC PEPTIDE: Pro Brain Natriuretic Peptide: 3275 pg/mL — ABNORMAL HIGH (ref <300.0)

## 2023-12-15 LAB — BASIC METABOLIC PANEL WITH GFR
Anion gap: 6 (ref 5–15)
BUN: 11 mg/dL (ref 8–23)
CO2: 39 mmol/L — ABNORMAL HIGH (ref 22–32)
Calcium: 9.1 mg/dL (ref 8.9–10.3)
Chloride: 90 mmol/L — ABNORMAL LOW (ref 98–111)
Creatinine, Ser: 0.61 mg/dL (ref 0.44–1.00)
GFR, Estimated: >60 mL/min (ref >60)
Glucose, Bld: 96 mg/dL (ref 70–99)
Potassium: 3.8 mmol/L (ref 3.5–5.1)
Sodium: 135 mmol/L (ref 135–145)

## 2023-12-15 LAB — PROTIME-INR
INR: 2 — ABNORMAL HIGH (ref 0.8–1.2)
Prothrombin Time: 23.6 s — ABNORMAL HIGH (ref 11.4–15.2)

## 2023-12-15 MED ORDER — FUROSEMIDE 10 MG/ML IJ SOLN
80.0000 mg | Freq: Three times a day (TID) | INTRAMUSCULAR | Status: DC
  Administered 2023-12-15 – 2023-12-19 (×13): 80 mg via INTRAVENOUS
  Filled 2023-12-15 (×12): qty 8

## 2023-12-15 MED ORDER — WARFARIN SODIUM 5 MG PO TABS
5.0000 mg | ORAL_TABLET | Freq: Once | ORAL | Status: AC
  Administered 2023-12-15: 5 mg via ORAL
  Filled 2023-12-15: qty 1

## 2023-12-15 MED ORDER — FUROSEMIDE 10 MG/ML IJ SOLN
40.0000 mg | Freq: Two times a day (BID) | INTRAMUSCULAR | Status: DC
  Administered 2023-12-15: 40 mg via INTRAVENOUS
  Filled 2023-12-15: qty 4

## 2023-12-15 NOTE — Plan of Care (Signed)

## 2023-12-15 NOTE — Progress Notes (Signed)
 PHARMACY - ANTICOAGULATION CONSULT NOTE  Pharmacy Consult for warfarin Indication: atrial fibrillation  Allergies  Allergen Reactions   Levaquin  [Levofloxacin ] Swelling    Patient Measurements: Height: 5' 4 (162.6 cm) Weight: 64.2 kg (141 lb 8.6 oz) IBW/kg (Calculated) : 54.7 HEPARIN  DW (KG): 66.8  Vital Signs: Temp: 98.2 F (36.8 C) (10/30 0243) Temp Source: Oral (10/30 0243) BP: 122/50 (10/30 0243) Pulse Rate: 82 (10/30 0243)  Labs: Recent Labs    12/13/23 0455 12/14/23 0503 12/15/23 0451  LABPROT 27.5* 27.4* 23.6*  INR 2.4* 2.4* 2.0*  CREATININE 0.61 0.60 0.61    Estimated Creatinine Clearance: 56.5 mL/min (by C-G formula based on SCr of 0.61 mg/dL).   Medical History: Past Medical History:  Diagnosis Date   Anxiety    Asthma    COPD (chronic obstructive pulmonary disease) (HCC)    Coronary artery disease    a. s/p NSTEMI in 11/2016 and required CABG with LIMA-LAD, SVG-LCx and SVG-D1 and complicated by cardiogenic shock b. cath in 05/2020 showing severe RCA stenosis and treated with orbital atherectomy and stent placement   GERD (gastroesophageal reflux disease)    History of CVA (cerebrovascular accident)    History of lung cancer 2020   Peripheral vascular disease    S/P TAVR (transcatheter aortic valve replacement) 08/12/2020   s/p TAVR with a 23 mm Edwards S3U via the TF approach by Dr. Verlin & Dr. Lucas   Severe aortic stenosis     Medications:  Medications Prior to Admission  Medication Sig Dispense Refill Last Dose/Taking   albuterol  (PROVENTIL ) (2.5 MG/3ML) 0.083% nebulizer solution Take 2.5 mg by nebulization every 4 (four) hours as needed for wheezing or shortness of breath.   12/08/2023 Morning   albuterol  (VENTOLIN  HFA) 108 (90 Base) MCG/ACT inhaler Inhale 1-2 puffs into the lungs every 6 (six) hours as needed for wheezing or shortness of breath.   12/08/2023 Noon   ALPRAZolam  (XANAX ) 0.5 MG tablet Take 0.5 mg by mouth 3 (three) times  daily.   12/08/2023 Evening   Ascorbic Acid (VITAMIN C GUMMIE PO) Take 400 mg by mouth daily.   12/08/2023 Morning   aspirin  EC 81 MG tablet Take 81 mg by mouth daily. Swallow whole.   12/08/2023 Bedtime   atorvastatin  (LIPITOR ) 40 MG tablet Take 1 tablet (40 mg total) daily by mouth. 30 tablet 1 12/08/2023 Morning   azithromycin  (ZITHROMAX ) 250 MG tablet Take 1 tablet (250 mg total) by mouth 3 (three) times a week. 36 tablet 6 Past Week   diphenhydramine-acetaminophen  (TYLENOL  PM) 25-500 MG TABS tablet Take 2 tablets by mouth at bedtime.   12/08/2023 Bedtime   Fluticasone-Umeclidin-Vilant (TRELEGY ELLIPTA) 100-62.5-25 MCG/ACT AEPB Inhale 1 Inhalation into the lungs daily. 3 each 6 12/08/2023 Morning   ibuprofen (ADVIL) 200 MG tablet Take 400 mg by mouth every 6 (six) hours as needed for moderate pain (pain score 4-6).   12/08/2023 Morning   ipratropium-albuterol  (DUONEB) 0.5-2.5 (3) MG/3ML SOLN One in am and one at supper time 180 mL 11 Past Week   magnesium  oxide (MAG-OX) 400 (240 Mg) MG tablet Take 400 mg by mouth daily.   12/08/2023 Morning   metoprolol  succinate (TOPROL -XL) 25 MG 24 hr tablet TAKE 0.5 TABLETS (12.5 MG TOTAL) BY MOUTH DAILY 45 tablet 3 12/08/2023 Morning   nystatin (MYCOSTATIN) 100000 UNIT/ML suspension Take 5 mLs by mouth 4 (four) times daily.   Past Month   pantoprazole  (PROTONIX ) 40 MG tablet Take 40 mg by mouth daily.   12/08/2023  Morning   potassium chloride  SA (KLOR-CON ) 20 MEQ tablet Take 20 mEq by mouth in the morning.   12/08/2023 Morning   torsemide  (DEMADEX ) 20 MG tablet Take 40 mg by mouth as needed (based on her weight).   12/08/2023 Morning   TRELEGY ELLIPTA 100-62.5-25 MCG/ACT AEPB Inhale 1 puff into the lungs daily.   12/08/2023 Morning   budesonide  (PULMICORT ) 0.25 MG/2ML nebulizer solution One vial with duonbe twice daily (Patient not taking: Reported on 12/09/2023) 120 mL 12 Not Taking   primidone (MYSOLINE) 50 MG tablet Take 50 mg by mouth daily.        Assessment: Pharmacy consulted to dose warfarin in patient with atrial fibrillation.  Patient is primidone prior to admission which is contraindicated with Eliquis - could consider Eliquis  in future if patient stops taking primidone.   INR 1.2> 1.7>2.0>2.4>2.4>2.0 CBC WNL  Goal of Therapy:  INR 2-3 Monitor platelets by anticoagulation protocol: Yes   Plan:  Warfarin 5 mg x 1 dose Monitor daily INR and s/s of bleeding.  Elspeth Sour, PharmD Clinical Pharmacist 12/15/2023 8:34 AM

## 2023-12-15 NOTE — Progress Notes (Signed)
 Rounding Note   Patient Name: Emily Rocha Date of Encounter: 12/15/2023  [facility] Cardiologist: Alvan Carrier, MD   Subjective  Reports her breathing has improved. Still with lower extremity edema. Denies any chest pain or palpitations.   Scheduled Meds:  ALPRAZolam   0.5 mg Oral TID   ascorbic acid  500 mg Oral Daily   atorvastatin   40 mg Oral Daily   budesonide   0.25 mg Nebulization Daily   Chlorhexidine  Gluconate Cloth  6 each Topical Daily   empagliflozin  10 mg Oral Daily   ipratropium-albuterol   3 mL Nebulization BID   magnesium  oxide  400 mg Oral Daily   metoprolol  tartrate  12.5 mg Oral BID   midodrine  5 mg Oral TID WC   mupirocin  ointment  1 Application Nasal BID   neomycin-bacitracin-polymyxin   Topical TID   pantoprazole   40 mg Oral Daily   primidone  50 mg Oral Daily   warfarin  5 mg Oral ONCE-1600   Warfarin - Pharmacist Dosing Inpatient   Does not apply q1600   Continuous Infusions:  PRN Meds: acetaminophen  **OR** acetaminophen , HYDROcodone bit-homatropine, ipratropium-albuterol , magnesium  hydroxide, ondansetron  **OR** ondansetron  (ZOFRAN ) IV, oxyCODONE , traZODone   Vital Signs  Vitals:   12/14/23 2109 12/15/23 0243 12/15/23 0500 12/15/23 0833  BP: (!) 132/54 (!) 122/50    Pulse: 77 82    Resp:  19    Temp:  98.2 F (36.8 C)    TempSrc:  Oral    SpO2:  92%  (!) 85%  Weight:   64.2 kg   Height:        Intake/Output Summary (Last 24 hours) at 12/15/2023 1041 Last data filed at 12/15/2023 0600 Gross per 24 hour  Intake 600 ml  Output 1400 ml  Net -800 ml      12/15/2023    5:00 AM 12/14/2023    3:43 AM 12/13/2023    3:38 AM  Last 3 Weights  Weight (lbs) 141 lb 8.6 oz 136 lb 14.5 oz 134 lb 11.2 oz  Weight (kg) 64.2 kg 62.1 kg 61.1 kg      Telemetry NSR, HR in 60's to 80's.  Brief tachycardia on 12/14/2023 around 2045 which lasted for less than 5 minutes with heart rate into the 130's and appears most consistent with  A-fib.- Personally Reviewed  ECG   No new tracings.   Physical Exam  GEN: Pleasant female appearing in no acute distress.   Neck: JVD to jawline. Cardiac: RRR, 2/6 systolic murmur along RUSB.  Respiratory: Decreased breath sounds along bases bilaterally. GI: Soft, nontender, non-distended  MS: 1+ pitting edema bilaterally.  Neuro:  Nonfocal  Psych: Normal affect   Labs High Sensitivity Troponin:  No results for input(s): TROPONINIHS in the last 720 hours.   Chemistry Recent Labs  Lab 12/11/23 0451 12/12/23 0518 12/13/23 0455 12/13/23 0953 12/14/23 0503 12/15/23 0451  NA 136 138 136  --  133* 135  K 4.2 3.8 3.7  --  4.2 3.8  CL 91* 92* 91*  --  91* 90*  CO2 40* 42* 40*  --  39* 39*  GLUCOSE 85 85 92  --  101* 96  BUN 12 10 10   --  11 11  CREATININE 0.60 0.58 0.61  --  0.60 0.61  CALCIUM  8.8* 8.9 8.5*  --  9.1 9.1  MG 2.1 2.1  --   --   --   --   PROT  --   --   --  6.4*  --   --   ALBUMIN   --   --   --  3.7  --   --   AST  --   --   --  25  --   --   ALT  --   --   --  13  --   --   ALKPHOS  --   --   --  146*  --   --   BILITOT  --   --   --  0.5  --   --   GFRNONAA >60 >60 >60  --  >60 >60  ANIONGAP 5 4* 5  --  3* 6    Lipids No results for input(s): CHOL, TRIG, HDL, LABVLDL, LDLCALC, CHOLHDL in the last 168 hours.  Hematology Recent Labs  Lab 12/08/23 1808 12/09/23 0459 12/12/23 0518  WBC 5.8 5.7 5.8  RBC 3.86* 3.60* 3.63*  HGB 11.2* 10.6* 10.5*  HCT 37.8 34.8* 35.1*  MCV 97.9 96.7 96.7  MCH 29.0 29.4 28.9  MCHC 29.6* 30.5 29.9*  RDW 15.9* 16.0* 15.9*  PLT 181 157 148*   Thyroid  No results for input(s): TSH, FREET4 in the last 168 hours.  BNP Recent Labs  Lab 12/08/23 1808 12/15/23 0451  PROBNP 2,590.0* 3,275.0*    DDimer No results for input(s): DDIMER in the last 168 hours.   Radiology  CT Angio Chest Pulmonary Embolism (PE) W or WO Contrast Result Date: 12/14/2023 EXAM: CTA of the Chest with contrast for PE  12/14/2023 03:40:41 PM TECHNIQUE: CTA of the chest was performed after the administration of intravenous contrast. Multiplanar reformatted images are provided for review. MIP images are provided for review. Automated exposure control, iterative reconstruction, and/or weight based adjustment of the mA/kV was utilized to reduce the radiation dose to as low as reasonably achievable. COMPARISON: None available. CLINICAL HISTORY: Pulmonary embolism (PE) suspected, low to intermediate probability, negative D-dimer, shortness of breath, lower extremity edema. FINDINGS: PULMONARY ARTERIES: Pulmonary arteries are adequately opacified for evaluation. No filling defect observed in the pulmonary arterial tree to suggest pulmonary embolus. Main pulmonary artery is normal in caliber. MEDIASTINUM: Prior TAVR. Cardiomegaly noted. Prior CABG. Thoracic aortic, coronary artery, and branch vessel atheromatous vascular calcifications. There is no acute abnormality of the thoracic aorta. LYMPH NODES: No mediastinal, hilar or axillary lymphadenopathy. LUNGS AND PLEURA: Prior right upper lobectomy. Wedge resection in the right middle lobe. Emphysema. Partially calcified left upper lobe anterior subpleural nodules for example on image 53 series 12, unchanged. No focal consolidation or pulmonary edema. Small left pleural effusion with passive atelectasis. Moderate loculated right pleural effusion especially along the right hemidiaphragm. No pneumothorax. UPPER ABDOMEN: Perisplenic ascites. Small to moderate sized hiatal hernia. SOFT TISSUES AND BONES: No acute bone or soft tissue abnormality. IMPRESSION: 1. No pulmonary embolism. 2. Moderate loculated right pleural effusion and small left pleural effusion with passive atelectasis. 3. Emphysema and atherosclerosis.  Prior TAVR and CABG. 4. Cardiomegaly. Electronically signed by: Ryan Salvage MD 12/14/2023 04:20 PM EDT RP Workstation: [id number]    Cardiac Studies  Echocardiogram:  11/2023 IMPRESSIONS     1. Left ventricular ejection fraction, by estimation, is 55 to 60%. The  left ventricle has normal function. The left ventricle has no regional  wall motion abnormalities. There is mild asymmetric left ventricular  hypertrophy. Left ventricular diastolic  function could not be evaluated. There is the interventricular septum is  flattened in systole and diastole, consistent with right ventricular  pressure and volume  overload.   2. Right ventricular systolic function is moderately reduced. The right  ventricular size is moderately enlarged. There is mildly elevated  pulmonary artery systolic pressure. The estimated right ventricular  systolic pressure is 39.8 mmHg.   3. Left atrial size was severely dilated.   4. Right atrial size was severely dilated.   5. The mitral valve is degenerative. Mild mitral valve regurgitation.  Moderate to severe mitral annular calcification.   6. Tricuspid valve regurgitation is moderate.   7. Mild paravalvular leak. The aortic valve has been repaired/replaced.  Aortic valve regurgitation is mild. Procedure Date: 08/12/2020. Aortic  valve area, by VTI measures 1.83 cm. Aortic valve mean gradient measures  7.0 mmHg. Aortic valve Vmax measures  1.82 m/s.   8. The inferior vena cava is dilated in size with <50% respiratory  variability, suggesting right atrial pressure of 15 mmHg.   Comparison(s): No significant change from prior study.    Patient Profile    68 y.o. female w/ PMH of CAD (s/p NSTEMI in 11/2016 and required CABG with LIMA-LAD, SVG-LCx and SVG-D1, cath in 05/2020 showing severe RCA stenosis and treated with orbital atherectomy and stent placement), chronic HFimpEF (EF 45-50% in 11/2016, normalized by repeat imaging), severe AS (s/p TAVR on 08/12/2020), HTN, HLD, COPD, history of lung cancer (s/p right upper lobectomy in 2020), remote history of atrial fibrillation (occurring during prior admission with sepsis and PNA)  and history of TIA's (s/p right subclavian and right common carotid bypass in 12/2017) who is currently admitted for an acute CHF exacerbation and atrial fibrillation with RVR.   Assessment & Plan   1. Acute HFimpEF - proBNP elevated at 2590 on admission and repeat echocardiogram shows a preserved EF of 55 to 60% with moderately reduced RV function and mildly elevated PASP. CTA performed on 12/14/2023 showed no evidence of a PE but was noted to have a moderate loculated right pleural effusion and small left pleural effusion.  - She has been receiving IV Lasix  80 mg twice daily but doses have been held intermittently given soft BP.  Has also been continued on Jardiance 10 mg daily. Given soft BP, she was started on Midodrine 5 mg 3 times daily. She has a recorded net output of -6.2 L thus far and weight has declined from a peak of 147 lbs to 141 lbs. Previously 138 lbs in 10/2023 and 133 lbs in 08/2023. Creatinine remains stable at 0.61 with K+ at 3.8. Given persistent volume overload on examination and effusions by CTA yesterday, will order IV Lasix  40 mg twice daily. Reassess volume status tomorrow.  2. Atrial Fibrillation with RVR - Initial occurrence was several years ago in the setting of pneumonia and sepsis. Prior Head CT showed a subdural hemorrhage and most recent imaging from 2024 did not show any evidence of residual subdural hemorrhage. She has been started on Coumadin  for anticoagulation as DOACs are contraindicated given the concurrent use of Primidone. INR at 2.0 today. - Remains on Lopressor  12.5 mg twice daily and this has not been further titrated given her soft BP.  She did have brief episodes of tachycardia last night as discussed above but the longest episode lasted less than 5 minutes.  3. CAD - She has a history of CABG in 2018 and underwent orbital arthrectomy and stent placement to the RCA in 05/2020. Denies any recent anginal symptoms.   - Continue Atorvastatin  40 mg daily and  Lopressor  12.5 mg twice daily.  No longer on ASA  given the need for anticoagulation.  4.  Severe aortic stenosis - She previously underwent TAVR in 2022 and repeat echocardiogram shows mild paravalvular leak with mild aortic valve regurgitation.  Continue to follow as an outpatient.  5. Pulmonary Hypertension - Echo this admission shows moderately reduced RV function and mildly elevated PASP at 39.8 mmHg. Felt to be likely group 2/3 in the setting of HFpEF and COPD. Was recommended to consider a VQ scan at some point to rule out CTEPH given her history of a PE.  6. COPD - On 3L [place] at baseline. Management per the admitting team.   For questions or updates, please contact [facility] Please consult [email or web address] for contact info under    Signed, Laymon CHRISTELLA Qua, PA-C  12/15/2023, 10:41 AM

## 2023-12-15 NOTE — Progress Notes (Signed)
 Progress Note   [name] FMW:[id number] DOB: 1955/08/18 DOA: 12/08/2023     6 DOS: the patient was seen and examined on 12/15/2023    Subjective: The patient was seen and examined this morning, stable no acute distress Still complain lower extremity edema Shortness of breath only with exertion-satting 85% on 3 L of oxygen     Brief hospital admission narrative: As per H&P written by Dr. Lawence on 12/09/2023 [name] is a 68 y.o. Caucasian female with medical history significant for COPD, asthma, anxiety, coronary artery disease status post CABG, peripheral vascular disease and TAVR, who presented to the emergency room with acute onset of palpitations, without chest pain.  She described it as a feeling of heart fluttering.  She admits to dyspnea on exertion without orthopnea or paroxysmal nocturnal dyspnea.  She denied any cough or wheezing.  No fever or chills.  No nausea or vomiting or abdominal pain.  No dysuria, oliguria or hematuria or flank pain.   ED Course: Upon presentation to the emergency room, BP was 136/120 with heart rate 102 and respiratory to 24 pulse oximetry 94% on room air.  Labs revealed CO2 of 40 with chloride 93 and otherwise unremarkable BMP.  proBNP was 2590 and high sensitive troponin I was 19 and later 17.  CBC showed hemoglobin 11.2 hematocrit 37.8. EKG as reviewed by me : EKG showed atrial fibrillation with rapid ventricular sponsor 138, right axis deviation and prolonged QT interval with QTc 511 MS.. Imaging: 2 view chest x-ray showed cardiomegaly with vascular congestion and edema as well as small right pleural effusion and right lung base atelectasis/infiltrate.   ==================================================================================== Assessment and Plan:  * Acute on chronic diastolic CHF (congestive heart failure) (HCC) - Shortness of breath with exertion, still having +2 pitting edema in lower extremities bilaterally  -  Echocardiogram: HFpEF ,  EF 55 to 60%, mild LVH, interventricular septum flattened in systole and diastole, RV systolic function with moderate reduction, RVSP 39,8 mmHg, LA and RA with severe dilatation, moderate tricuspid regurgitation, sp TAVR. -Acute on chronic cor pulmonale-continue diuretics  -Pulmonary hypertension, likely group 3  -RV failure with overall poor prognosis.   - Spironolactone  and SGLT2 inhibitor has been added  - Continue midodrine 5 mg p.o. 3 times daily, -Initiated IV Lasix  80 mg x 2 >> per cardiology reduced to 40 mg twice daily Continue Jardiance 10 mg p.o. daily  -Continue TED hose, SCDs -Chest x-ray-consider pulmonary congestion, elevated BNP 2590,  Intake/Output Summary (Last 24 hours) at 12/15/2023 1200 Last data filed at 12/15/2023 0600 Gross per 24 hour  Intake 600 ml  Output 1400 ml  Net -800 ml   Wt  144 >> 136 lbs   Filed Weights   12/13/23 0338 12/14/23 0343 12/15/23 0500  Weight: 61.1 kg 62.1 kg 64.2 kg     CAD (coronary artery disease)/Prior CABG/aortic stenosis -No acute coronary syndrome.  -Continue blood pressure monitoring  - TAVR in 2022  Atrial fibrillation with RVR (HCC) -Continue rate control with metoprolol .  History of subdural hematoma recent imaging 2024 did not show any evidence of residual She has been started on Coumadin , monitor INR (2-3    Chronic obstructive pulmonary disease (COPD) (HCC) -Continue bronchodilator therapy -Continue inhaled corticosteroids.  -No signs of acute exacerbation .  Dyslipidemia -Continue statin.  Anxiety -Continue with alprazolam .   Pulmonary hypertension - Continue to monitor closely - CTA reviewed, negative for PE Moderate loculated right pleural effusion and small left pleural  effusion with passive atelectasis. 3. Emphysema and atherosclerosis.  Prior TAVR and CABG. 4. Cardiomegaly. Continue diuretics, attempt thoracentesis   GERD without esophagitis -Continue  PPI.     Physical Exam: Vitals:   12/14/23 2109 12/15/23 0243 12/15/23 0500 12/15/23 0833  BP: (!) 132/54 (!) 122/50    Pulse: 77 82    Resp:  19    Temp:  98.2 F (36.8 C)    TempSrc:  Oral    SpO2:  92%  (!) 85%  Weight:   64.2 kg   Height:         General:  AAO x 3,  cooperative, no distress;   HEENT:  Normocephalic, PERRL, otherwise with in Normal limits   Neuro:  CNII-XII intact. , normal motor and sensation, reflexes intact   Lungs:   Clear to auscultation BL, Respirations unlabored,  No wheezes / crackles  Cardio:    S1/S2, RRR, No murmure, No Rubs or Gallops   Abdomen:  Soft, non-tender, bowel sounds active all four quadrants, no guarding or peritoneal signs.  Muscular  skeletal:  Limited exam -global generalized weaknesses - in bed, able to move all 4 extremities,   2+ pulses,  symmetric, +2  pitting edema  Skin:  Dry, warm to touch, negative for any Rashes,  Wounds: Please see nursing documentation     INR: 2.0  . Latest data Reviewed: Lab Results  Component Value Date   CREATININE 0.61 12/15/2023   CREATININE 0.60 12/14/2023   CREATININE 0.61 12/13/2023  chondromalacia patella    Latest Ref Rng & Units 12/15/2023    4:51 AM 12/14/2023    5:03 AM 12/13/2023    9:53 AM  CMP  Glucose 70 - 99 mg/dL 96  898    BUN 8 - 23 mg/dL 11  11    Creatinine 9.55 - 1.00 mg/dL 9.38  9.39    Sodium 864 - 145 mmol/L 135  133    Potassium 3.5 - 5.1 mmol/L 3.8  4.2    Chloride 98 - 111 mmol/L 90  91    CO2 22 - 32 mmol/L 39  39    Calcium  8.9 - 10.3 mg/dL 9.1  9.1    Total Protein 6.5 - 8.1 g/dL   6.4   Total Bilirubin 0.0 - 1.2 mg/dL   0.5   Alkaline Phos 38 - 126 U/L   146   AST 15 - 41 U/L   25   ALT 0 - 44 U/L   13       Latest Ref Rng & Units 12/12/2023    5:18 AM 12/09/2023    4:59 AM 12/08/2023    6:08 PM  CBC  WBC 4.0 - 10.5 K/uL 5.8  5.7  5.8   Hemoglobin 12.0 - 15.0 g/dL 89.4  89.3  88.7   Hematocrit 36.0 - 46.0 % 35.1  34.8  37.8   Platelets  150 - 400 K/uL 148  157  181         Family Communication: Husband at bedside.  Disposition: Status is: Inpatient Remains inpatient appropriate because: recovering heart failure    Planned Discharge Destination: Home in next 24-48 hours   Time spent 55 minutes -evaluating patient, reviewing labs, meds, coordinating plan of care with cardiology   author: Adriana DELENA Grams, MD 12/15/2023 12:00 PM  For on call review [email or web address].

## 2023-12-16 ENCOUNTER — Inpatient Hospital Stay (HOSPITAL_COMMUNITY)

## 2023-12-16 ENCOUNTER — Encounter (HOSPITAL_COMMUNITY): Payer: Self-pay | Admitting: Family Medicine

## 2023-12-16 DIAGNOSIS — I50810 Right heart failure, unspecified: Secondary | ICD-10-CM

## 2023-12-16 DIAGNOSIS — J90 Pleural effusion, not elsewhere classified: Secondary | ICD-10-CM | POA: Diagnosis not present

## 2023-12-16 DIAGNOSIS — I517 Cardiomegaly: Secondary | ICD-10-CM | POA: Diagnosis not present

## 2023-12-16 DIAGNOSIS — Z952 Presence of prosthetic heart valve: Secondary | ICD-10-CM

## 2023-12-16 DIAGNOSIS — I272 Pulmonary hypertension, unspecified: Secondary | ICD-10-CM

## 2023-12-16 DIAGNOSIS — I2510 Atherosclerotic heart disease of native coronary artery without angina pectoris: Secondary | ICD-10-CM | POA: Diagnosis not present

## 2023-12-16 DIAGNOSIS — R0602 Shortness of breath: Secondary | ICD-10-CM | POA: Diagnosis not present

## 2023-12-16 DIAGNOSIS — I700 Atherosclerosis of aorta: Secondary | ICD-10-CM | POA: Diagnosis not present

## 2023-12-16 DIAGNOSIS — I5033 Acute on chronic diastolic (congestive) heart failure: Secondary | ICD-10-CM | POA: Diagnosis not present

## 2023-12-16 LAB — PRO BRAIN NATRIURETIC PEPTIDE: Pro Brain Natriuretic Peptide: 3285 pg/mL — ABNORMAL HIGH (ref <300.0)

## 2023-12-16 LAB — BASIC METABOLIC PANEL WITH GFR
Anion gap: 8 (ref 5–15)
BUN: 11 mg/dL (ref 8–23)
CO2: 38 mmol/L — ABNORMAL HIGH (ref 22–32)
Calcium: 8.9 mg/dL (ref 8.9–10.3)
Chloride: 89 mmol/L — ABNORMAL LOW (ref 98–111)
Creatinine, Ser: 0.51 mg/dL (ref 0.44–1.00)
GFR, Estimated: >60 mL/min (ref >60)
Glucose, Bld: 92 mg/dL (ref 70–99)
Potassium: 3.7 mmol/L (ref 3.5–5.1)
Sodium: 135 mmol/L (ref 135–145)

## 2023-12-16 LAB — BODY FLUID CELL COUNT WITH DIFFERENTIAL
Eos, Fluid: 0 %
Lymphs, Fluid: 74 %
Monocyte-Macrophage-Serous Fluid: 18 % — ABNORMAL LOW (ref 50–90)
Neutrophil Count, Fluid: 8 % (ref 0–25)
Total Nucleated Cell Count, Fluid: 479 uL (ref 0–1000)

## 2023-12-16 LAB — PROTIME-INR
INR: 2.5 — ABNORMAL HIGH (ref 0.8–1.2)
Prothrombin Time: 28.1 s — ABNORMAL HIGH (ref 11.4–15.2)

## 2023-12-16 LAB — LACTATE DEHYDROGENASE, PLEURAL OR PERITONEAL FLUID: LD, Fluid: 89 U/L — ABNORMAL HIGH (ref 3–23)

## 2023-12-16 LAB — PREALBUMIN: Prealbumin: 10 mg/dL — ABNORMAL LOW (ref 18–38)

## 2023-12-16 LAB — GRAM STAIN

## 2023-12-16 MED ORDER — POTASSIUM CHLORIDE CRYS ER 20 MEQ PO TBCR
20.0000 meq | EXTENDED_RELEASE_TABLET | Freq: Once | ORAL | Status: AC
  Administered 2023-12-16: 20 meq via ORAL
  Filled 2023-12-16: qty 1

## 2023-12-16 MED ORDER — LIDOCAINE HCL (PF) 2 % IJ SOLN
10.0000 mL | Freq: Once | INTRAMUSCULAR | Status: DC
  Filled 2023-12-16: qty 10

## 2023-12-16 MED ORDER — LIDOCAINE HCL (PF) 2 % IJ SOLN
INTRAMUSCULAR | Status: AC
  Filled 2023-12-16: qty 10

## 2023-12-16 MED ORDER — SENNOSIDES 8.8 MG/5ML PO SYRP
10.0000 mL | ORAL_SOLUTION | Freq: Two times a day (BID) | ORAL | Status: DC
  Administered 2023-12-17 – 2023-12-20 (×7): 10 mL via ORAL
  Filled 2023-12-16 (×11): qty 10

## 2023-12-16 MED ORDER — ALBUMIN HUMAN 25 % IV SOLN
25.0000 g | Freq: Once | INTRAVENOUS | Status: AC
  Administered 2023-12-16: 25 g via INTRAVENOUS
  Filled 2023-12-16: qty 100

## 2023-12-16 MED ORDER — WARFARIN SODIUM 2.5 MG PO TABS
2.5000 mg | ORAL_TABLET | Freq: Once | ORAL | Status: DC
Start: 2023-12-17 — End: 2023-12-16

## 2023-12-16 MED ORDER — WARFARIN SODIUM 2.5 MG PO TABS: 2.5000 mg | ORAL_TABLET | Freq: Once | ORAL | Status: DC

## 2023-12-16 NOTE — Progress Notes (Signed)
 Emily Rocha at [facility] returned call and report given

## 2023-12-16 NOTE — Progress Notes (Addendum)
 Report called to [id number] and nurse will return my call

## 2023-12-16 NOTE — Progress Notes (Addendum)
 PHARMACY - ANTICOAGULATION CONSULT NOTE  Pharmacy Consult for warfarin>>heparin  Indication: atrial fibrillation  Allergies  Allergen Reactions   Levaquin  [Levofloxacin ] Swelling    Patient Measurements: Height: 5' 4 (162.6 cm) Weight: 63.6 kg (140 lb 4.8 oz) IBW/kg (Calculated) : 54.7 HEPARIN  DW (KG): 66.8  Vital Signs: Temp: 98 F (36.7 C) (10/31 0531) Temp Source: Oral (10/31 0531) BP: 140/89 (10/31 0531) Pulse Rate: 109 (10/31 0531)  Labs: Recent Labs    12/14/23 0503 12/15/23 0451 12/16/23 0455  LABPROT 27.4* 23.6* 28.1*  INR 2.4* 2.0* 2.5*  CREATININE 0.60 0.61  --     Estimated Creatinine Clearance: 56.5 mL/min (by C-G formula based on SCr of 0.61 mg/dL).   Medical History: Past Medical History:  Diagnosis Date   Anxiety    Asthma    COPD (chronic obstructive pulmonary disease) (HCC)    Coronary artery disease    a. s/p NSTEMI in 11/2016 and required CABG with LIMA-LAD, SVG-LCx and SVG-D1 and complicated by cardiogenic shock b. cath in 05/2020 showing severe RCA stenosis and treated with orbital atherectomy and stent placement   GERD (gastroesophageal reflux disease)    History of CVA (cerebrovascular accident)    History of lung cancer 2020   Peripheral vascular disease    S/P TAVR (transcatheter aortic valve replacement) 08/12/2020   s/p TAVR with a 23 mm Edwards S3U via the TF approach by Dr. Verlin & Dr. Lucas   Severe aortic stenosis     Assessment: Pharmacy consulted to dose warfarin in patient with atrial fibrillation.  Patient is primidone prior to admission which is contraindicated with Eliquis - could consider Eliquis  in future if patient stops taking primidone.   INR continues to be at goal. No issues noted.   INR 1.2>1.7>2.0>2.4>2.4>2.0>2.5 CBC WNL on 10/27  Addendum: Primary team and cardiology feel that patient may benefit from heart cath at [facility]. New orders to transition to heparin  once INR <2. Hold warfarin for now.   Goal of  Therapy:  INR 2-3 Monitor platelets by anticoagulation protocol: Yes   Plan:  Hold warfarin Recheck INR in am, when <2 will start IV heparin   Dempsey Blush PharmD., BCPS Clinical Pharmacist 12/16/2023 7:42 AM

## 2023-12-16 NOTE — Plan of Care (Signed)
  Problem: Education: Goal: Knowledge of General Education information will improve Description: Including pain rating scale, medication(s)/side effects and non-pharmacologic comfort measures Outcome: Progressing   Problem: Clinical Measurements: Goal: Respiratory complications will improve Outcome: Progressing   Problem: Activity: Goal: Risk for activity intolerance will decrease Outcome: Progressing   Problem: Nutrition: Goal: Adequate nutrition will be maintained Outcome: Progressing   Problem: Pain Managment: Goal: General experience of comfort will improve and/or be controlled Outcome: Progressing

## 2023-12-16 NOTE — Progress Notes (Addendum)
 Rounding Note   Patient Name: Emily Rocha Date of Encounter: 12/16/2023  [facility] Cardiologist: Alvan Carrier, MD   Subjective  Says her breathing continues to improve. No chest pain or palpitations. Scheduled for thoracentesis today and she is nervous about the procedure.   Scheduled Meds:  ALPRAZolam   0.5 mg Oral TID   ascorbic acid  500 mg Oral Daily   atorvastatin   40 mg Oral Daily   budesonide   0.25 mg Nebulization Daily   Chlorhexidine  Gluconate Cloth  6 each Topical Daily   empagliflozin  10 mg Oral Daily   furosemide   80 mg Intravenous TID   ipratropium-albuterol   3 mL Nebulization BID   magnesium  oxide  400 mg Oral Daily   metoprolol  tartrate  12.5 mg Oral BID   midodrine  5 mg Oral TID WC   mupirocin  ointment  1 Application Nasal BID   neomycin-bacitracin-polymyxin   Topical TID   pantoprazole   40 mg Oral Daily   primidone  50 mg Oral Daily   warfarin  2.5 mg Oral ONCE-1600   Warfarin - Pharmacist Dosing Inpatient   Does not apply q1600   Continuous Infusions:  PRN Meds: acetaminophen  **OR** acetaminophen , HYDROcodone bit-homatropine, ipratropium-albuterol , magnesium  hydroxide, ondansetron  **OR** ondansetron  (ZOFRAN ) IV, oxyCODONE , traZODone   Vital Signs  Vitals:   12/15/23 2139 12/16/23 0500 12/16/23 0514 12/16/23 0531  BP: 131/67  136/61 (!) 140/89  Pulse: 88  76 (!) 109  Resp:   20 16  Temp:   98.2 F (36.8 C) 98 F (36.7 C)  TempSrc:   Oral Oral  SpO2:   95% 93%  Weight:  63.6 kg    Height:        Intake/Output Summary (Last 24 hours) at 12/16/2023 0810 Last data filed at 12/15/2023 2019 Gross per 24 hour  Intake --  Output 1050 ml  Net -1050 ml      12/16/2023    5:00 AM 12/15/2023    5:00 AM 12/14/2023    3:43 AM  Last 3 Weights  Weight (lbs) 140 lb 4.8 oz 141 lb 8.6 oz 136 lb 14.5 oz  Weight (kg) 63.64 kg 64.2 kg 62.1 kg      Telemetry  NSR, HR in 60's to 80's.  - Personally Reviewed  ECG  No new tracings.    Physical Exam  GEN: Pleasant female appearing in no acute distress.   Neck: No JVD Cardiac: RRR, 2/6 systolic murmur along RUSB.  Respiratory: Decreased breath sounds along bases.  GI: Soft, nontender, non-distended  MS: 1+ pitting edema bilaterally; No deformity. Neuro:  Nonfocal  Psych: Normal affect   Labs High Sensitivity Troponin:  No results for input(s): TROPONINIHS in the last 720 hours.   Chemistry Recent Labs  Lab 12/11/23 0451 12/12/23 0518 12/13/23 0455 12/13/23 0953 12/14/23 0503 12/15/23 0451  NA 136 138 136  --  133* 135  K 4.2 3.8 3.7  --  4.2 3.8  CL 91* 92* 91*  --  91* 90*  CO2 40* 42* 40*  --  39* 39*  GLUCOSE 85 85 92  --  101* 96  BUN 12 10 10   --  11 11  CREATININE 0.60 0.58 0.61  --  0.60 0.61  CALCIUM  8.8* 8.9 8.5*  --  9.1 9.1  MG 2.1 2.1  --   --   --   --   PROT  --   --   --  6.4*  --   --  ALBUMIN   --   --   --  3.7  --   --   AST  --   --   --  25  --   --   ALT  --   --   --  13  --   --   ALKPHOS  --   --   --  146*  --   --   BILITOT  --   --   --  0.5  --   --   GFRNONAA >60 >60 >60  --  >60 >60  ANIONGAP 5 4* 5  --  3* 6    Lipids No results for input(s): CHOL, TRIG, HDL, LABVLDL, LDLCALC, CHOLHDL in the last 168 hours.  Hematology Recent Labs  Lab 12/12/23 0518  WBC 5.8  RBC 3.63*  HGB 10.5*  HCT 35.1*  MCV 96.7  MCH 28.9  MCHC 29.9*  RDW 15.9*  PLT 148*   Thyroid  No results for input(s): TSH, FREET4 in the last 168 hours.  BNP Recent Labs  Lab 12/15/23 0451 12/16/23 0455  PROBNP 3,275.0* 3,285.0*    DDimer No results for input(s): DDIMER in the last 168 hours.   Radiology  US  CHEST (PLEURAL EFFUSION) Result Date: 12/15/2023 CLINICAL DATA:  BILATERAL pleural effusion.  Shortness of breath. EXAM: CHEST ULTRASOUND COMPARISON:  CT angiography chest 12/14/2023 FINDINGS: Ultrasound evaluation of the chest demonstrates small LEFT and moderate RIGHT pleural effusions. IMPRESSION: Moderate RIGHT  and small LEFT pleural effusions. Electronically Signed   By: Aliene Lloyd M.D.   On: 12/15/2023 15:17   CT Angio Chest Pulmonary Embolism (PE) W or WO Contrast Result Date: 12/14/2023 EXAM: CTA of the Chest with contrast for PE 12/14/2023 03:40:41 PM TECHNIQUE: CTA of the chest was performed after the administration of intravenous contrast. Multiplanar reformatted images are provided for review. MIP images are provided for review. Automated exposure control, iterative reconstruction, and/or weight based adjustment of the mA/kV was utilized to reduce the radiation dose to as low as reasonably achievable. COMPARISON: None available. CLINICAL HISTORY: Pulmonary embolism (PE) suspected, low to intermediate probability, negative D-dimer, shortness of breath, lower extremity edema. FINDINGS: PULMONARY ARTERIES: Pulmonary arteries are adequately opacified for evaluation. No filling defect observed in the pulmonary arterial tree to suggest pulmonary embolus. Main pulmonary artery is normal in caliber. MEDIASTINUM: Prior TAVR. Cardiomegaly noted. Prior CABG. Thoracic aortic, coronary artery, and branch vessel atheromatous vascular calcifications. There is no acute abnormality of the thoracic aorta. LYMPH NODES: No mediastinal, hilar or axillary lymphadenopathy. LUNGS AND PLEURA: Prior right upper lobectomy. Wedge resection in the right middle lobe. Emphysema. Partially calcified left upper lobe anterior subpleural nodules for example on image 53 series 12, unchanged. No focal consolidation or pulmonary edema. Small left pleural effusion with passive atelectasis. Moderate loculated right pleural effusion especially along the right hemidiaphragm. No pneumothorax. UPPER ABDOMEN: Perisplenic ascites. Small to moderate sized hiatal hernia. SOFT TISSUES AND BONES: No acute bone or soft tissue abnormality. IMPRESSION: 1. No pulmonary embolism. 2. Moderate loculated right pleural effusion and small left pleural effusion with  passive atelectasis. 3. Emphysema and atherosclerosis.  Prior TAVR and CABG. 4. Cardiomegaly. Electronically signed by: Ryan Salvage MD 12/14/2023 04:20 PM EDT RP Workstation: [id number]    Cardiac Studies  Echocardiogram: 11/2023 IMPRESSIONS     1. Left ventricular ejection fraction, by estimation, is 55 to 60%. The  left ventricle has normal function. The left ventricle has no regional  wall motion abnormalities. There is mild  asymmetric left ventricular  hypertrophy. Left ventricular diastolic  function could not be evaluated. There is the interventricular septum is  flattened in systole and diastole, consistent with right ventricular  pressure and volume overload.   2. Right ventricular systolic function is moderately reduced. The right  ventricular size is moderately enlarged. There is mildly elevated  pulmonary artery systolic pressure. The estimated right ventricular  systolic pressure is 39.8 mmHg.   3. Left atrial size was severely dilated.   4. Right atrial size was severely dilated.   5. The mitral valve is degenerative. Mild mitral valve regurgitation.  Moderate to severe mitral annular calcification.   6. Tricuspid valve regurgitation is moderate.   7. Mild paravalvular leak. The aortic valve has been repaired/replaced.  Aortic valve regurgitation is mild. Procedure Date: 08/12/2020. Aortic  valve area, by VTI measures 1.83 cm. Aortic valve mean gradient measures  7.0 mmHg. Aortic valve Vmax measures  1.82 m/s.   8. The inferior vena cava is dilated in size with <50% respiratory  variability, suggesting right atrial pressure of 15 mmHg.   Comparison(s): No significant change from prior study.    Patient Profile   68 y.o. female w/ PMH of CAD (s/p NSTEMI in 11/2016 and required CABG with LIMA-LAD, SVG-LCx and SVG-D1, cath in 05/2020 showing severe RCA stenosis and treated with orbital atherectomy and stent placement), chronic HFimpEF (EF 45-50% in 11/2016,  normalized by repeat imaging), severe AS (s/p TAVR on 08/12/2020), HTN, HLD, COPD, history of lung cancer (s/p right upper lobectomy in 2020), remote history of atrial fibrillation (occurring during prior admission with sepsis and PNA) and history of TIA's (s/p right subclavian and right common carotid bypass in 12/2017) who is currently admitted for an acute CHF exacerbation and atrial fibrillation with RVR.   Assessment & Plan   1. Acute HFimpEF - She was admitted with a CHF exacerbation and repeat echocardiogram shows a preserved EF with moderately reduced RV function and mildly elevated PASP. CTA performed on 12/14/2023 showed no evidence of a PE but was noted to have a moderate loculated right pleural effusion and small left pleural effusion. Scheduled for thoracentesis today.  - Lasix  was held at times given hypotension but she received 40 mg yesterday followed by an additional 80 mg x 2. A BMET was not ordered for today but will add on to previously drawn AM labs for reassessment of electrolytes and renal function. Tentatively scheduled to receive IV Lasix  80mg  TID. She has been continued on Jardiance 10 mg daily.  Recorded net output of -7.3 L thus far and weight down to 140 lbs.  - She was started on Midodrine 5 mg 3 times daily this admission but BP has continued to improve. Suspect this can be reduced to 2.5 mg 3 times daily over the weekend and possibly stopped pending BP response.  2. Atrial Fibrillation with RVR - Initial occurrence was several years ago in the setting of pneumonia and sepsis and found to have recurrence this admission. Maintaining NSR at this time. Continue Lopressor  12.5mg  BID for rate-control. Has not been further titrated given hypotension. - Prior Head CT showed a subdural hemorrhage and most recent imaging from 2024 did not show any evidence of residual subdural hemorrhage. She is now on Coumadin  for anticoagulation as DOACs are contraindicated given the concurrent use  of Primidone. INR at 2.5 today and appreciate pharmacy's assistance with dosing. An outpatient INR check will need to be scheduled at the time of discharge.  3. CAD - She has a history of CABG in 2018 and underwent orbital arthrectomy and stent placement to the RCA in 05/2020.  - No reports of anginal symptoms.  - Continue current medical therapy with Atorvastatin  40 mg daily and Lopressor  12.5 mg twice daily. No longer on ASA given the need for anticoagulation.   4.  Severe aortic stenosis - She previously underwent TAVR in 2022 and repeat echocardiogram this admission shows mild paravalvular leak with mild aortic valve regurgitation. Anticipate repeat echo in 1 year for reassessment.   5. Pulmonary Hypertension - Echo this admission shows moderately reduced RV function and mildly elevated PASP at 39.8 mmHg. Felt to be likely group 2/3 in the setting of HFpEF and COPD. - As previously discussed, can consider VQ Scan as an outpatient to rule-out CTEPH.    6. COPD - On 3L [place] at baseline.   For questions or updates, please contact [facility] Please consult [email or web address] for contact info under    Signed, Laymon CHRISTELLA Qua, PA-C  12/16/2023, 8:10 AM

## 2023-12-16 NOTE — Plan of Care (Signed)

## 2023-12-16 NOTE — Care Management Important Message (Signed)
 Important Message  Patient Details  Name: Emily Rocha MRN: [id number] Date of Birth: [birth year]   Important Message Given:  Yes - Medicare IM     Ibraheem Voris [name] 12/16/2023, 4:27 PM

## 2023-12-16 NOTE — Procedures (Signed)
 EXAM: ULTRASOUND GUIDED RIGHT THORACENTESIS 12/16/2023 01:56:48 PM  TECHNIQUE: Informed consent was obtained after a detailed explanation of the procedure including risks, benefits, and alternatives. Universal protocol was performed. The right chest was prepped and draped in sterile fashion and local anesthesia was achieved with lidocaine . An thoracentesis needle sheath was advanced under ultrasound guidance into pleural effusion and thoracentesis was performed. The patient tolerated the procedure well.   COMPARISON: None provided.  CLINICAL HISTORY: Loculated pleural effusion, shortness of breath  FINDINGS: A total of 600 cc was removed.  IMPRESSION: 1. Successful ultrasound guided right thoracentesis.

## 2023-12-16 NOTE — Progress Notes (Addendum)
 Progress Note   [name] FMW:[id number] DOB: Oct 01, 1955 DOA: 12/08/2023     7 DOS: the patient was seen and examined on 12/16/2023    Subjective: The patient was seen and examined this morning, complained of shortness of breath,  worsening lower extremity edema.  Agreed to follow-up with thoracentesis    Brief hospital admission narrative: As per H&P written by Dr. Lawence on 12/09/2023 [name] is a 68 y.o. Caucasian female with medical history significant for COPD, asthma, anxiety, coronary artery disease status post CABG, peripheral vascular disease and TAVR, who presented to the emergency room with acute onset of palpitations, without chest pain.  She described it as a feeling of heart fluttering.  She admits to dyspnea on exertion without orthopnea or paroxysmal nocturnal dyspnea.  She denied any cough or wheezing.  No fever or chills.  No nausea or vomiting or abdominal pain.  No dysuria, oliguria or hematuria or flank pain.   ED Course: Upon presentation to the emergency room, BP was 136/120 with heart rate 102 and respiratory to 24 pulse oximetry 94% on room air.  Labs revealed CO2 of 40 with chloride 93 and otherwise unremarkable BMP.  proBNP was 2590 and high sensitive troponin I was 19 and later 17.  CBC showed hemoglobin 11.2 hematocrit 37.8. EKG as reviewed by me : EKG showed atrial fibrillation with rapid ventricular sponsor 138, right axis deviation and prolonged QT interval with QTc 511 MS.. Imaging: 2 view chest x-ray showed cardiomegaly with vascular congestion and edema as well as small right pleural effusion and right lung base atelectasis/infiltrate.   ==================================================================================== Assessment and Plan:  * Acute on chronic diastolic CHF (congestive heart failure) (HCC) - Shortness of breath with exertion, still having +2 pitting edema in lower extremities bilaterally  - Echocardiogram: HFpEF ,   EF 55 to 60%, mild LVH, interventricular septum flattened in systole and diastole, RV systolic function with moderate reduction, RVSP 39,8 mmHg, LA and RA with severe dilatation, moderate tricuspid regurgitation, sp TAVR. -Acute on chronic cor pulmonale-continue diuretics  -Pulmonary hypertension, likely group 3  -RV failure with overall poor prognosis.   - Spironolactone  and SGLT2 inhibitor has been added  - Continue midodrine 5 mg p.o. 3 times daily, -Mildly hypotensive therefore Lasix  was held briefly, received 40 mg yesterday, additional 80 mg x 2, -Cardiology recommending IV Lasix  80 mg 3 times daily today -Continue Jardiance 10 mg p.o. daily  Hypotension - Midodrine 5 mg p.o. 3 times daily for hypertension was added Also will check albumin  level, replace albumin  to help with diuresis and blood pressure  -Continue TED hose, SCDs -Chest x-ray-consider pulmonary congestion, elevated BNP 2590,  Intake/Output Summary (Last 24 hours) at 12/16/2023 1144 Last data filed at 12/15/2023 2019 Gross per 24 hour  Intake --  Output 1050 ml  Net -1050 ml   Wt  144 >> 136 lbs   Filed Weights   12/14/23 0343 12/15/23 0500 12/16/23 0500  Weight: 62.1 kg 64.2 kg 63.6 kg    Addendum Discussed with cardiology Dr. Bettyjo  -recommended for patient to be transferred to [facility] for left and right heart catheterization likely this Monday Progressive heart failure had RCA PCI in 2022, now requiring midodrine for lasix    Withholding Coumadin , transition to heparin  drip per pharmacy  pleural effusion  Consult IR to attempt thoracentesis for symptomatic management INR 2.5 CT and Ultrasound of chest was reviewed bilateral pleural effusion noted   CAD (coronary artery disease)/Prior CABG/aortic  stenosis -No acute coronary syndrome.  -Continue blood pressure monitoring  - TAVR in 2022  Atrial fibrillation with RVR (HCC) -Continue rate control with metoprolol .  History of subdural hematoma  recent imaging 2024 did not show any evidence of residual She has been started on Coumadin , monitor INR (2-3)    Chronic obstructive pulmonary disease (COPD) (HCC) -Continue bronchodilator therapy -Continue inhaled corticosteroids.  -No signs of acute exacerbation .  Dyslipidemia -Continue statin.  Anxiety -Continue with alprazolam .   Pulmonary hypertension - Continue to monitor closely - CTA reviewed, negative for PE Moderate loculated right pleural effusion and small left pleural effusion with passive atelectasis. 3. Emphysema and atherosclerosis.  Prior TAVR and CABG. 4. Cardiomegaly. Continue diuretics, attempt thoracentesis   GERD without esophagitis -Continue PPI.     Physical Exam: Vitals:   12/16/23 0500 12/16/23 0514 12/16/23 0531 12/16/23 0851  BP:  136/61 (!) 140/89   Pulse:  76 (!) 109   Resp:  20 16   Temp:  98.2 F (36.8 C) 98 F (36.7 C)   TempSrc:  Oral Oral   SpO2:  95% 93% 90%  Weight: 63.6 kg     Height:          General:  AAO x 3,  cooperative, no distress;   HEENT:  Normocephalic, PERRL, otherwise with in Normal limits   Neuro:  CNII-XII intact. , normal motor and sensation, reflexes intact   Lungs:   Clear to auscultation BL, Respirations unlabored,  No wheezes / crackles  Cardio:    S1/S2, RRR, No murmure, No Rubs or Gallops   Abdomen:  Soft, non-tender, bowel sounds active all four quadrants, no guarding or peritoneal signs.  Muscular  skeletal:  Limited exam -global generalized weaknesses - in bed, able to move all 4 extremities,   2+ pulses,  symmetric, +3  pitting edema  Skin:  Dry, warm to touch, negative for any Rashes,  Wounds: Please see nursing documentation             INR: 2.5 . Latest data Reviewed: Lab Results  Component Value Date   CREATININE 0.51 12/16/2023   CREATININE 0.61 12/15/2023   CREATININE 0.60 12/14/2023  chondromalacia patella    Latest Ref Rng & Units 12/16/2023    4:55 AM 12/15/2023     4:51 AM 12/14/2023    5:03 AM  CMP  Glucose 70 - 99 mg/dL 92  96  898   BUN 8 - 23 mg/dL 11  11  11    Creatinine 0.44 - 1.00 mg/dL 9.48  9.38  9.39   Sodium 135 - 145 mmol/L 135  135  133   Potassium 3.5 - 5.1 mmol/L 3.7  3.8  4.2   Chloride 98 - 111 mmol/L 89  90  91   CO2 22 - 32 mmol/L 38  39  39   Calcium  8.9 - 10.3 mg/dL 8.9  9.1  9.1       Latest Ref Rng & Units 12/12/2023    5:18 AM 12/09/2023    4:59 AM 12/08/2023    6:08 PM  CBC  WBC 4.0 - 10.5 K/uL 5.8  5.7  5.8   Hemoglobin 12.0 - 15.0 g/dL 89.4  89.3  88.7   Hematocrit 36.0 - 46.0 % 35.1  34.8  37.8   Platelets 150 - 400 K/uL 148  157  181        Family Communication: Husband at bedside.  Disposition: Status is: Inpatient Remains inpatient appropriate  because: recovering heart failure    Planned Discharge Destination: Home in next 24-48 hours   Time spent 55 minutes -evaluating patient, reviewing labs, meds, coordinating plan of care with cardiology   author: Adriana DELENA Grams, MD 12/16/2023 11:44 AM  For on call review [email or web address].

## 2023-12-17 DIAGNOSIS — E871 Hypo-osmolality and hyponatremia: Secondary | ICD-10-CM

## 2023-12-17 DIAGNOSIS — Z952 Presence of prosthetic heart valve: Secondary | ICD-10-CM | POA: Diagnosis not present

## 2023-12-17 DIAGNOSIS — I5033 Acute on chronic diastolic (congestive) heart failure: Secondary | ICD-10-CM | POA: Diagnosis not present

## 2023-12-17 DIAGNOSIS — I272 Pulmonary hypertension, unspecified: Secondary | ICD-10-CM | POA: Diagnosis not present

## 2023-12-17 DIAGNOSIS — I2510 Atherosclerotic heart disease of native coronary artery without angina pectoris: Secondary | ICD-10-CM | POA: Diagnosis not present

## 2023-12-17 DIAGNOSIS — J439 Emphysema, unspecified: Secondary | ICD-10-CM | POA: Diagnosis not present

## 2023-12-17 DIAGNOSIS — I4891 Unspecified atrial fibrillation: Secondary | ICD-10-CM | POA: Diagnosis not present

## 2023-12-17 LAB — COMPREHENSIVE METABOLIC PANEL WITH GFR
ALT: 15 U/L (ref 0–44)
AST: 23 U/L (ref 15–41)
Albumin: 3.5 g/dL (ref 3.5–5.0)
Alkaline Phosphatase: 122 U/L (ref 38–126)
Anion gap: 11 (ref 5–15)
BUN: 10 mg/dL (ref 8–23)
CO2: 37 mmol/L — ABNORMAL HIGH (ref 22–32)
Calcium: 8.3 mg/dL — ABNORMAL LOW (ref 8.9–10.3)
Chloride: 84 mmol/L — ABNORMAL LOW (ref 98–111)
Creatinine, Ser: 0.74 mg/dL (ref 0.44–1.00)
GFR, Estimated: >60 mL/min (ref >60)
Glucose, Bld: 129 mg/dL — ABNORMAL HIGH (ref 70–99)
Potassium: 3.5 mmol/L (ref 3.5–5.1)
Sodium: 132 mmol/L — ABNORMAL LOW (ref 135–145)
Total Bilirubin: 0.7 mg/dL (ref 0.0–1.2)
Total Protein: 6.4 g/dL — ABNORMAL LOW (ref 6.5–8.1)

## 2023-12-17 LAB — BASIC METABOLIC PANEL WITH GFR
Anion gap: 10 (ref 5–15)
BUN: 10 mg/dL (ref 8–23)
CO2: 38 mmol/L — ABNORMAL HIGH (ref 22–32)
Calcium: 8.5 mg/dL — ABNORMAL LOW (ref 8.9–10.3)
Chloride: 84 mmol/L — ABNORMAL LOW (ref 98–111)
Creatinine, Ser: 0.61 mg/dL (ref 0.44–1.00)
GFR, Estimated: >60 mL/min (ref >60)
Glucose, Bld: 137 mg/dL — ABNORMAL HIGH (ref 70–99)
Potassium: 3.6 mmol/L (ref 3.5–5.1)
Sodium: 132 mmol/L — ABNORMAL LOW (ref 135–145)

## 2023-12-17 LAB — PROTIME-INR
INR: 2.3 — ABNORMAL HIGH (ref 0.8–1.2)
Prothrombin Time: 26 s — ABNORMAL HIGH (ref 11.4–15.2)

## 2023-12-17 LAB — MAGNESIUM: Magnesium: 1.9 mg/dL (ref 1.7–2.4)

## 2023-12-17 LAB — BRAIN NATRIURETIC PEPTIDE: B Natriuretic Peptide: 966.8 pg/mL — ABNORMAL HIGH (ref 0.0–100.0)

## 2023-12-17 MED ORDER — POTASSIUM CHLORIDE CRYS ER 20 MEQ PO TBCR
40.0000 meq | EXTENDED_RELEASE_TABLET | Freq: Once | ORAL | Status: AC
  Administered 2023-12-17: 40 meq via ORAL
  Filled 2023-12-17: qty 2

## 2023-12-17 MED ORDER — BACITRACIN-NEOMYCIN-POLYMYXIN OINTMENT TUBE
TOPICAL_OINTMENT | Freq: Three times a day (TID) | CUTANEOUS | Status: DC
  Filled 2023-12-17: qty 14

## 2023-12-17 MED ORDER — MAGNESIUM SULFATE 2 GM/50ML IV SOLN
2.0000 g | Freq: Once | INTRAVENOUS | Status: AC
  Administered 2023-12-17: 2 g via INTRAVENOUS
  Filled 2023-12-17: qty 50

## 2023-12-17 NOTE — Progress Notes (Signed)
 Progress Note  Patient Name: Emily Rocha Date of Encounter: 12/17/2023  Primary Cardiologist: Alvan Carrier, MD  Subjective   SOB significantly improved after thoracentesis.  Inpatient Medications    Scheduled Meds:  ALPRAZolam   0.5 mg Oral TID   ascorbic acid  500 mg Oral Daily   atorvastatin   40 mg Oral Daily   budesonide   0.25 mg Nebulization Daily   empagliflozin  10 mg Oral Daily   furosemide   80 mg Intravenous TID   ipratropium-albuterol   3 mL Nebulization BID   lidocaine  HCl (PF)  10 mL Intradermal Once   magnesium  oxide  400 mg Oral Daily   metoprolol  tartrate  12.5 mg Oral BID   midodrine  5 mg Oral TID WC   neomycin-bacitracin-polymyxin   Topical TID   pantoprazole   40 mg Oral Daily   primidone  50 mg Oral Daily   sennosides  10 mL Oral BID   Continuous Infusions:  PRN Meds: acetaminophen  **OR** acetaminophen , HYDROcodone bit-homatropine, ipratropium-albuterol , magnesium  hydroxide, ondansetron  **OR** ondansetron  (ZOFRAN ) IV, oxyCODONE , traZODone   Vital Signs    Vitals:   12/17/23 0511 12/17/23 0821 12/17/23 0831 12/17/23 1157  BP:   112/60   Pulse: 80  87   Resp:   18   Temp:   97.9 F (36.6 C) 97.9 F (36.6 C)  TempSrc:   Oral Oral  SpO2: 93% 91% 95%   Weight:      Height:        Intake/Output Summary (Last 24 hours) at 12/17/2023 1307 Last data filed at 12/17/2023 1100 Gross per 24 hour  Intake 496.97 ml  Output 1900 ml  Net -1403.03 ml   Filed Weights   12/15/23 0500 12/16/23 0500 12/17/23 0500  Weight: 64.2 kg 63.6 kg 63.3 kg    Telemetry    Personally reviewed.  NSR.  ECG    Not performed today.  Physical Exam   GEN: No acute distress.   Neck: No JVD. Cardiac: RRR, no murmur, rub, or gallop.  Respiratory: Nonlabored. Clear to auscultation bilaterally. GI: Soft, nontender, bowel sounds present. MS: Pitting edema edema; No deformity. Neuro:  Nonfocal. Psych: Alert and oriented x 3. Normal affect.  Labs     Chemistry Recent Labs  Lab 12/13/23 [id number] 12/14/23 0503 12/15/23 0451 12/16/23 0455 12/17/23 0909  NA  --    < > 135 135 132*  K  --    < > 3.8 3.7 3.6  CL  --    < > 90* 89* 84*  CO2  --    < > 39* 38* 38*  GLUCOSE  --    < > 96 92 137*  BUN  --    < > 11 11 10   CREATININE  --    < > 0.61 0.51 0.61  CALCIUM   --    < > 9.1 8.9 8.5*  PROT 6.4*  --   --   --   --   ALBUMIN  3.7  --   --   --   --   AST 25  --   --   --   --   ALT 13  --   --   --   --   ALKPHOS 146*  --   --   --   --   BILITOT 0.5  --   --   --   --   GFRNONAA  --    < > >60 >60 >60  ANIONGAP  --    < >  6 8 10    < > = values in this interval not displayed.     Hematology Recent Labs  Lab 12/12/23 0518  WBC 5.8  RBC 3.63*  HGB 10.5*  HCT 35.1*  MCV 96.7  MCH 28.9  MCHC 29.9*  RDW 15.9*  PLT 148*    Cardiac EnzymesNo results for input(s): TROPONINIHS in the last 720 hours.  BNP Recent Labs  Lab 12/15/23 0451 12/16/23 0455 12/17/23 0229  BNP  --   --  966.8*  PROBNP 3,275.0* 3,285.0*  --      DDimerNo results for input(s): DDIMER in the last 168 hours.   Radiology    US  THORACENTESIS ASP PLEURAL SPACE W/IMG GUIDE Result Date: 12/16/2023 EXAM: ULTRASOUND GUIDED RIGHT THORACENTESIS 12/16/2023 01:56:48 PM TECHNIQUE: Informed consent was obtained after a detailed explanation of the procedure including risks, benefits, and alternatives. Universal protocol was performed. The right chest was prepped and draped in sterile fashion and local anesthesia was achieved with lidocaine . An thoracentesis needle sheath was advanced under ultrasound guidance into pleural effusion and thoracentesis was performed. The patient tolerated the procedure well. COMPARISON: None provided. CLINICAL HISTORY: Loculated pleural effusion, shortness of breath FINDINGS: A total of 600 cc was removed. IMPRESSION: 1. Successful ultrasound guided right thoracentesis. Electronically signed by: Ryan Salvage MD 12/16/2023  02:40 PM EDT RP Workstation: [id number]   DG Chest 1 View Result Date: 12/16/2023 EXAM: 1 VIEW XRAY OF THE CHEST 12/16/2023 01:58:00 PM COMPARISON: 12/08/2023 CLINICAL HISTORY: SOB (shortness of breath) FINDINGS: LUNGS AND PLEURA: Decreased trace right pleural effusion. Unchanged interstitial markings. Right partial pneumonectomy postsurgical changes. No focal pulmonary opacity. No pulmonary edema. No pneumothorax. HEART AND MEDIASTINUM: Cardiomegaly. Prosthetic aortic valve noted. Aortic calcification. BONES AND SOFT TISSUES: Sternotomy wires and surgical clips noted. No acute osseous abnormality. IMPRESSION: 1. No acute cardiopulmonary findings. 2. Cardiomegaly with prosthetic aortic valve. 3. Decreased trace right pleural effusion. 4. Stable interstitial markings. Electronically signed by: Norleen Boxer MD 12/16/2023 02:35 PM EDT RP Workstation: [id number]   US  CHEST (PLEURAL EFFUSION) Result Date: 12/15/2023 CLINICAL DATA:  BILATERAL pleural effusion.  Shortness of breath. EXAM: CHEST ULTRASOUND COMPARISON:  CT angiography chest 12/14/2023 FINDINGS: Ultrasound evaluation of the chest demonstrates small LEFT and moderate RIGHT pleural effusions. IMPRESSION: Moderate RIGHT and small LEFT pleural effusions. Electronically Signed   By: Aliene Lloyd M.D.   On: 12/15/2023 15:17    Assessment & Plan     Acute on chronic diastolic heart failure - Presented with DOE, nocturnal cough, abdominal distention, pitting edema in the legs x few weeks.  No orthopnea or PND reported. - proBNP elevated, 2,590. - On IV Lasix  80 mg TID.  Missed many doses during this admission due to soft blood pressures and requiring midodrine support.  Continues to have puffy feet and legs.  SOB significantly improved after thoracentesis. - Due to ADHF requiring midodrine support and rate RV dysfunction, she will benefit from [facility] and [facility] to rule out RCA ISR and for workup of pulmonary hypertension.  She is agreeable to [facility] and [facility]  on Monday. - Prior echocardiogram from 2024 showed normal LVEF, moderate RV systolic dysfunction, moderate enlargement of RV, CVP 15 mmHg.  Echo this admission also showed similar findings of normal LV function, moderate RV systolic dysfunction, moderate RV enlargement, mild pulmonary hypertension.  Mild MR, moderate TR, CVP 15 mmHg.  RV failure History of pulm embolism in 2021 - Echo from 2024 and 2025 showed normal LV function but moderate RV systolic dysfunction  and moderate RV enlargement.  Patient had pulm embolism in 2021, initially started on Eliquis  but later had to be discontinued within 1 week due to incidental finding of subdural hemorrhage (based on recommendations from neurosurgery NP) with plans to follow-up in neurosurgery office but I did not find any note that she was officially evaluated by them to resume Eliquis .  She will need VQ scan either inpatient or outpatient, to rule out CTEPH.  Atrial fibrillation with RVR - Presented with A-fib with RVR.  Prior onset in the setting of pneumonia. - Telemetry reviewed, in NSR.  Continue metoprolol  tartrate 12.5 mg twice daily. -  She was never started systemic AC until this admission due to underlying COPD and elevated risk of atrial arrhythmias hence.  On primidone, DOACs contraindicated.  Started on Coumadin , held for [facility] and [facility] on Monday.  Pleural effusion s/p thoracentesis on 12/16/2023 - Noted significant improvement in her breathing after thoracentesis.  Drained 600 cc pleural fluid.  CAD s/p CABG in 2018, s/p RCA PCI in 2022 - Not on aspirin  due to Coumadin  use.  Holding Coumadin  for [facility] and [facility] on Monday.  Continue atorvastatin  40 mg nightly.  Severe AS s/p TAVR in 2022 - Stable gradients.  Monitor.  COPD on home O2 - Per primary team.    30 minutes spent in reviewing prior medical records, more than 3 labs, discussion and documentation.  Signed, Diannah SHAUNNA Maywood, MD  12/17/2023, 1:07 PM

## 2023-12-17 NOTE — Plan of Care (Signed)

## 2023-12-17 NOTE — Assessment & Plan Note (Signed)
 Today renal function with serum cr at 0,57 with K at 3,8 and serum bicarbonate at 40  Na 133 and Mg 2.4   Renal function today with serum cr at 0,71 with K at 4,7 and serum bicarbonate at 38  Na 131 and Mg 2.3  Plan to transition to po loop diuretic in am.

## 2023-12-17 NOTE — Plan of Care (Signed)
  Problem: Education: Goal: Knowledge of General Education information will improve Description: Including pain rating scale, medication(s)/side effects and non-pharmacologic comfort measures 12/17/2023 0654 by Jennine Benton HERO, RN Outcome: Progressing 12/16/2023 1858 by Jennine Benton HERO, RN Outcome: Progressing   Problem: Health Behavior/Discharge Planning: Goal: Ability to manage health-related needs will improve 12/17/2023 0654 by Jennine Benton HERO, RN Outcome: Progressing 12/16/2023 1858 by Jennine Benton HERO, RN Outcome: Progressing   Problem: Clinical Measurements: Goal: Ability to maintain clinical measurements within normal limits will improve 12/17/2023 0654 by Jennine Benton HERO, RN Outcome: Progressing 12/16/2023 1858 by Jennine Benton HERO, RN Outcome: Progressing Goal: Will remain free from infection 12/17/2023 0654 by Jennine Benton HERO, RN Outcome: Progressing 12/16/2023 1858 by Jennine Benton HERO, RN Outcome: Progressing Goal: Diagnostic test results will improve 12/17/2023 0654 by Jennine Benton HERO, RN Outcome: Progressing 12/16/2023 1858 by Jennine Benton HERO, RN Outcome: Progressing Goal: Respiratory complications will improve 12/17/2023 0654 by Jennine Benton HERO, RN Outcome: Progressing 12/16/2023 1858 by Jennine Benton HERO, RN Outcome: Progressing Goal: Cardiovascular complication will be avoided 12/17/2023 0654 by Jennine Benton HERO, RN Outcome: Progressing 12/16/2023 1858 by Jennine Benton HERO, RN Outcome: Progressing   Problem: Activity: Goal: Risk for activity intolerance will decrease 12/17/2023 0654 by Jennine Benton HERO, RN Outcome: Progressing 12/16/2023 1858 by Jennine Benton HERO, RN Outcome: Progressing   Problem: Nutrition: Goal: Adequate nutrition will be maintained 12/17/2023 0654 by Jennine Benton HERO, RN Outcome: Progressing 12/16/2023 1858 by Jennine Benton HERO, RN Outcome: Progressing   Problem: Coping: Goal: Level of anxiety  will decrease 12/17/2023 0654 by Jennine Benton HERO, RN Outcome: Progressing 12/16/2023 1858 by Jennine Benton HERO, RN Outcome: Progressing   Problem: Elimination: Goal: Will not experience complications related to bowel motility 12/17/2023 0654 by Jennine Benton HERO, RN Outcome: Progressing 12/16/2023 1858 by Jennine Benton HERO, RN Outcome: Progressing Goal: Will not experience complications related to urinary retention 12/17/2023 0654 by Jennine Benton HERO, RN Outcome: Progressing 12/16/2023 1858 by Jennine Benton HERO, RN Outcome: Progressing   Problem: Pain Managment: Goal: General experience of comfort will improve and/or be controlled 12/17/2023 0654 by Jennine Benton HERO, RN Outcome: Progressing 12/16/2023 1858 by Jennine Benton HERO, RN Outcome: Progressing   Problem: Safety: Goal: Ability to remain free from injury will improve 12/17/2023 0654 by Jennine Benton HERO, RN Outcome: Progressing 12/16/2023 1858 by Jennine Benton HERO, RN Outcome: Progressing   Problem: Skin Integrity: Goal: Risk for impaired skin integrity will decrease 12/17/2023 0654 by Jennine Benton HERO, RN Outcome: Progressing 12/16/2023 1858 by Jennine Benton HERO, RN Outcome: Progressing   Problem: Education: Goal: Ability to demonstrate management of disease process will improve 12/17/2023 0654 by Jennine Benton HERO, RN Outcome: Progressing 12/16/2023 1858 by Jennine Benton HERO, RN Outcome: Progressing Goal: Ability to verbalize understanding of medication therapies will improve 12/17/2023 0654 by Jennine Benton HERO, RN Outcome: Progressing 12/16/2023 1858 by Jennine Benton HERO, RN Outcome: Progressing Goal: Individualized Educational Video(s) 12/17/2023 0654 by Jennine Benton HERO, RN Outcome: Progressing 12/16/2023 1858 by Jennine Benton HERO, RN Outcome: Progressing   Problem: Activity: Goal: Capacity to carry out activities will improve 12/17/2023 0654 by Jennine Benton HERO, RN Outcome:  Progressing 12/16/2023 1858 by Jennine Benton HERO, RN Outcome: Progressing   Problem: Cardiac: Goal: Ability to achieve and maintain adequate cardiopulmonary perfusion will improve 12/17/2023 0654 by Jennine Benton HERO, RN Outcome: Progressing 12/16/2023 1858 by Jennine Benton HERO, RN Outcome: Progressing

## 2023-12-17 NOTE — Progress Notes (Addendum)
 Physical Therapy Treatment Patient Details Name: Emily Rocha MRN: [id number] DOB: March 10, 1955 Today's Date: 12/17/2023   History of Present Illness Emily Rocha is a 68 y.o. Caucasian female who presented to the ER with acute onset of palpitations, without chest pain. She described it as a feeling of heart fluttering. She admits to dyspnea on exertion without orthopnea or paroxysmal nocturnal dyspnea. She denied any cough or wheezing. No fever or chills. No nausea or vomiting or abdominal pain. No dysuria, oliguria or hematuria or flank pain. 12/16/23 pt s/p US  GUIDED RIGHT THORACENTESIS with removed. PMH: COPD on 3L O2 at baseline, asthma, anxiety, coronary artery disease status post CABG, peripheral vascular disease and TAVR.    PT Comments  Pt received in chair, pleasantly agreeable to therapy session and with good participation and tolerance for transfer and gait training. Pt needed BLE TED hose adjusted over feet due to edema/poor position, and noted to be hypoxic when SpO2 decreased to 2L/min. Pt needed increase to 3L/min (her typical baseline) to maintain 91% and above with standing/ambulation tasks. Pt performed gait trial with no AD ~272ft without AD but needed intermittent CGA for safety due to mild instability. Pt defers RW use but PTA encouraging her to consider using it for energy conservation and due to her fear of falls. Pt continues to benefit from PT services to progress toward functional mobility goals, continue to recommend HHPT. Vital Signs  BP (!) 123/47 (69) HR 77 bpm  BP Location Right Arm  BP Method Automatic  Patient Position (if appropriate) Sitting   Vital Signs  BP 125/64 (79) HR 75 bpm  BP Location Right Arm  BP Method Automatic  Patient Position (if appropriate) Standing     If plan is discharge home, recommend the following: Help with stairs or ramp for entrance;A little help with walking and/or transfers   Can travel by private vehicle         Equipment Recommendations  None recommended by PT    Recommendations for Other Services       Precautions / Restrictions Precautions Precautions: Fall Recall of Precautions/Restrictions: Intact Precaution/Restrictions Comments: Contact precs; watch O2 Restrictions Weight Bearing Restrictions Per Provider Order: No     Mobility  Bed Mobility                    Transfers Overall transfer level: Modified independent Equipment used: None               General transfer comment: SpO2 desat with standing    Ambulation/Gait Ambulation/Gait assistance: Contact guard assist, Supervision Gait Distance (Feet): 200 Feet Assistive device: None Gait Pattern/deviations: Step-through pattern, Decreased step length - right, Decreased step length - left, Decreased stance time - right, Decreased stance time - left, Decreased stride length       General Gait Details: Increased DOE 1-2/4, SpO2 desat to 84% on 2L O2 [place], increased to 3L/min and maintain SpO2 ~91% remainder of trial after cues for pursed-lip breathing. Mild instability with increased gait distance so pt provided with CGA for safety, but no buckling or significant LOB. PTA discussed with pt to use RW when ambulating in room by herself or in hallway with nursing staff for improved safety/activity tolerance.   Stairs             Wheelchair Mobility     Tilt Bed    Modified Rankin (Stroke Patients Only)       Balance Overall balance assessment: Mild deficits  observed, not formally tested                                          Communication Communication Communication: Impaired Factors Affecting Communication: Hearing impaired (pt does not want to wear hearing aids for fear of losing them due to cost)  Cognition Arousal: Alert Behavior During Therapy: WFL for tasks assessed/performed   PT - Cognitive impairments: No apparent impairments                       PT -  Cognition Comments: HoH Following commands: Intact      Cueing Cueing Techniques: Verbal cues, Gestural cues  Exercises Other Exercises Other Exercises: discussed reciprocal STS x5 for BLE strengthening she can perform from chair, RN arriving to assess pt loose PIV at end of session, pt agreeable to seated/standing exercises next session    General Comments General comments (skin integrity, edema, etc.): SpO2 desat on 2L/min, needed 3L/min to maintain WFL, HR WFL throughout      Pertinent Vitals/Pain Pain Assessment Pain Assessment: No/denies pain Pain Intervention(s): Monitored during session, Repositioned    Home Living                          Prior Function            PT Goals (current goals can now be found in the care plan section) Acute Rehab PT Goals Patient Stated Goal: Pt. wants HH to improve endurance PT Goal Formulation: With patient Time For Goal Achievement: 12/22/23 Progress towards PT goals: Progressing toward goals    Frequency    Min 3X/week      PT Plan      Co-evaluation              AM-PAC PT 6 Clicks Mobility   Outcome Measure  Help needed turning from your back to your side while in a flat bed without using bedrails?: None Help needed moving from lying on your back to sitting on the side of a flat bed without using bedrails?: None Help needed moving to and from a bed to a chair (including a wheelchair)?: None Help needed standing up from a chair using your arms (e.g., wheelchair or bedside chair)?: None Help needed to walk in hospital room?: A Little Help needed climbing 3-5 steps with a railing? : A Little 6 Click Score: 22    End of Session Equipment Utilized During Treatment: Gait belt;Oxygen  Activity Tolerance: Patient tolerated treatment well Patient left: in chair;with call bell/phone within reach;Other (comment) (pt compliant with call bell) Nurse Communication: Mobility status PT Visit Diagnosis: Unsteadiness  on feet (R26.81);Repeated falls (R29.6);Muscle weakness (generalized) (M62.81)     Time: 8270-8247 PT Time Calculation (min) (ACUTE ONLY): 23 min  Charges:    $Gait Training: 8-22 mins $Therapeutic Activity: 8-22 mins PT General Charges $$ ACUTE PT VISIT: 1 Visit                     Amyre Segundo P., PTA Acute Rehabilitation Services Secure Chat Preferred 9a-5:30pm Office: [phone]    Connell HERO [facility] 12/17/2023, 6:12 PM

## 2023-12-17 NOTE — Progress Notes (Signed)
   12/17/23 0044  BiPAP/CPAP/SIPAP  BiPAP/CPAP/SIPAP Pt Type Adult  BiPAP/CPAP/SIPAP Resmed  Mask Type Nasal mask  Mask Size Small  EPAP 8 cmH2O  FiO2 (%) 36 %  Flow Rate 4 lpm  Patient Home Machine No  Patient Home Mask No  Patient Home Tubing No  Auto Titrate No  Device Plugged into RED Power Outlet Yes  BiPAP/CPAP /SiPAP Vitals  Resp 18  Bilateral Breath Sounds Clear;Diminished

## 2023-12-17 NOTE — Progress Notes (Signed)
 PHARMACY - ANTICOAGULATION CONSULT NOTE  Pharmacy Consult for warfarin>>heparin  Indication: atrial fibrillation  Allergies  Allergen Reactions   Levaquin  [Levofloxacin ] Swelling    Patient Measurements: Height: 5' 4 (162.6 cm) Weight: 63.3 kg (139 lb 9.6 oz) IBW/kg (Calculated) : 54.7 HEPARIN  DW (KG): 66.8  Vital Signs: Temp: 98.1 F (36.7 C) (11/01 0101) Temp Source: Oral (11/01 0101) BP: 107/49 (11/01 0509) Pulse Rate: 80 (11/01 0511)  Labs: Recent Labs    12/15/23 0451 12/16/23 0455 12/17/23 0230  LABPROT 23.6* 28.1* 26.0*  INR 2.0* 2.5* 2.3*  CREATININE 0.61 0.51  --     Estimated Creatinine Clearance: 56.5 mL/min (by C-G formula based on SCr of 0.51 mg/dL).   Medical History: Past Medical History:  Diagnosis Date   Anxiety    Asthma    COPD (chronic obstructive pulmonary disease) (HCC)    Coronary artery disease    a. s/p NSTEMI in 11/2016 and required CABG with LIMA-LAD, SVG-LCx and SVG-D1 and complicated by cardiogenic shock b. cath in 05/2020 showing severe RCA stenosis and treated with orbital atherectomy and stent placement   GERD (gastroesophageal reflux disease)    History of CVA (cerebrovascular accident)    History of lung cancer 2020   Peripheral vascular disease    S/P TAVR (transcatheter aortic valve replacement) 08/12/2020   s/p TAVR with a 23 mm Edwards S3U via the TF approach by Dr. Verlin & Dr. Lucas   Severe aortic stenosis     Assessment: Pharmacy consulted to dose warfarin in patient with atrial fibrillation.  Patient is primidone prior to admission which is contraindicated with Eliquis - could consider Eliquis  in future if patient stops taking primidone.    Plans for right heart cath and pharmacy to start heparin  when INR < 2.0 -INR= 2.3  Addendum: Primary team and cardiology feel that patient may benefit from heart cath at [facility]. New orders to transition to heparin  once INR <2. Hold warfarin for now.   Goal of Therapy:  INR  2-3 Monitor platelets by anticoagulation protocol: Yes   Plan:  -Start IV heparin  when INR < 2.0 -Daily PT/INR  Prentice Poisson, PharmD Clinical Pharmacist **Pharmacist phone directory can now be found on [email or web address] ([email or web address]).  Listed under [facility].

## 2023-12-17 NOTE — Progress Notes (Signed)
   12/17/23 2312  BiPAP/CPAP/SIPAP  BiPAP/CPAP/SIPAP Pt Type Adult  BiPAP/CPAP/SIPAP Resmed  Reason BIPAP/CPAP not in use Non-compliant (pt refused)  Patient Home Machine No  Patient Home Mask No  Patient Home Tubing No  Auto Titrate No  BiPAP/CPAP /SiPAP Vitals  Resp 17  Bilateral Breath Sounds Rhonchi

## 2023-12-17 NOTE — Progress Notes (Addendum)
 Progress Note   [name] FMW:[id number] DOB: Sep 07, 1955 DOA: 12/08/2023     8 DOS: the patient was seen and examined on 12/17/2023   Brief hospital course: [name] was admitted to the hospital with the working diagnosis of heart failure exacerbation  69 yo female with the past medical history of COPD, astham, coronary artery disease, peripheral vascular disease, and sp TAVR who presented with palpitations and dyspnea. No PND, orthopnea or lower extremity edema.  On her initial physical examination her blood pressure was 136/120, HR 102, RR 24 and 02 saturation 94%  Lungs with decreased breath sounds at bases and positive bilateral rales, heart with S1 and S2 present, irregularly irregular with no gallops, rubs or murmurs, abdomen with no distention and positive lower extremity edema.   Na 139, K 4,1 Cl 93 bicarbonate 40 glucose 95 bun 12 cr 0,54  BNP 2,590  High sensitive troponin 19 and 17   Chest radiograph with cardiomegaly, bilateral hilar vascular congestion, bilateral pleural effusions, more right than left. Sternotomy wires in place  EKG 138 bpm, normal axis, qtc 511, atrial fibrillation rhythm with no significant ST segment or T wave changes.   Patient was placed on IV furosemide  for diuresis.  Echocardiogram with RV failure and severe pulmonary hypertension.  Patient was placed on midodrine for blood pressure support.  10/31 right thoracentesis 600 cc removed.   Decision was made to transfer to [facility] for cardiac catheterization.  11/01 At [facility], plan for catheterization on 11/03   Assessment and Plan: * Acute on chronic diastolic heart failure (HCC) Echocardiogram with preserved LV systolic function with EF 55 to 60%, mild LVH, interventricular septum flattened in systole and diastole, RV systolic function with moderate reduction, RVSP 39,8 mmHg, LA and RA with severe dilatation, moderate tricuspid regurgitation, sp TAVR.   Acute on chronic cor pulmonale Pulmonary  hypertension, likely group 3 RV failure with poor prognosis.   Documented urine output is 1,200 ml Systolic blood pressure 120 mmHg range.   Continue diuresis with furosemide  80 mg IV tid On SGLT 2 inh to augment diuresis.  Continue metoprolol .  Limited RAAS inhibition due to risk of hypotension.  Continue midodrine for blood pressure support.   CAD (coronary artery disease)/Prior CABG No acute coronary syndrome.  Continue blood pressure monitoring   Atrial fibrillation with RVR (HCC) Continue rate control with metoprolol .  Anticoagulation with warfarin   Chronic obstructive pulmonary disease (COPD) (HCC) Continue bronchodilator therapy Continue inhaled corticosteroids.  No signs of acute exacerbation .  Hyponatremia Renal function with serum cr at 0,61 with K at 3.6 and serum bicarbonate at 38  Na 132 and Mg 1,9  Plan to add 40 meq Kcl and 2 g mag sulfate to prevent hypokalemia and hypomagnesemia.  Follow up renal function and electrolytes in am.  Continue diuresis with furosemide  and SGLT 2 inh   Dyslipidemia Continue with atorvastatin    GERD without esophagitis Continue pantoprazole .,   Anxiety Continue with alprazolam .    Subjective: Patient with no chest pain, dyspnea has improved, no palpitations   Physical Exam: Vitals:   12/17/23 0511 12/17/23 0821 12/17/23 0831 12/17/23 1157  BP:   112/60   Pulse: 80  87   Resp:   18   Temp:   97.9 F (36.6 C) 97.9 F (36.6 C)  TempSrc:   Oral Oral  SpO2: 93% 91% 95%   Weight:      Height:       Neurology awake and alert  ENT with mild pallor Cardiovascular with S1 and S2 present and regular with no gallops, or rubs, positive systolic murmur at the left lower sternal border Mild JVD Respiratory with rales bilaterally and prolonged expiratory phase with no wheezing  Abdomen with no distention  Lower extremity edema + ted hose in place  Data Reviewed:    Family Communication: no family at the bedside    Disposition: Status is: Inpatient Remains inpatient appropriate because: cardiac catheterization   Planned Discharge Destination: Home    Author: Elidia Toribio Furnace, MD 12/17/2023 2:37 PM  For on call review [email or web address].

## 2023-12-18 DIAGNOSIS — E871 Hypo-osmolality and hyponatremia: Secondary | ICD-10-CM

## 2023-12-18 DIAGNOSIS — Z86711 Personal history of pulmonary embolism: Secondary | ICD-10-CM

## 2023-12-18 DIAGNOSIS — I50810 Right heart failure, unspecified: Secondary | ICD-10-CM

## 2023-12-18 DIAGNOSIS — I4891 Unspecified atrial fibrillation: Secondary | ICD-10-CM | POA: Diagnosis not present

## 2023-12-18 DIAGNOSIS — Z515 Encounter for palliative care: Secondary | ICD-10-CM

## 2023-12-18 DIAGNOSIS — I5033 Acute on chronic diastolic (congestive) heart failure: Secondary | ICD-10-CM | POA: Diagnosis not present

## 2023-12-18 DIAGNOSIS — Z7189 Other specified counseling: Secondary | ICD-10-CM | POA: Diagnosis not present

## 2023-12-18 DIAGNOSIS — I509 Heart failure, unspecified: Secondary | ICD-10-CM

## 2023-12-18 DIAGNOSIS — J439 Emphysema, unspecified: Secondary | ICD-10-CM | POA: Diagnosis not present

## 2023-12-18 DIAGNOSIS — I2510 Atherosclerotic heart disease of native coronary artery without angina pectoris: Secondary | ICD-10-CM | POA: Diagnosis not present

## 2023-12-18 LAB — BASIC METABOLIC PANEL WITH GFR
Anion gap: 9 (ref 5–15)
BUN: 10 mg/dL (ref 8–23)
CO2: 40 mmol/L — ABNORMAL HIGH (ref 22–32)
Calcium: 8.5 mg/dL — ABNORMAL LOW (ref 8.9–10.3)
Chloride: 84 mmol/L — ABNORMAL LOW (ref 98–111)
Creatinine, Ser: 0.57 mg/dL (ref 0.44–1.00)
GFR, Estimated: >60 mL/min (ref >60)
Glucose, Bld: 104 mg/dL — ABNORMAL HIGH (ref 70–99)
Potassium: 3.8 mmol/L (ref 3.5–5.1)
Sodium: 133 mmol/L — ABNORMAL LOW (ref 135–145)

## 2023-12-18 LAB — GLUCOSE, CAPILLARY: Glucose-Capillary: 158 mg/dL — ABNORMAL HIGH (ref 70–99)

## 2023-12-18 LAB — PROTIME-INR
INR: 1.6 — ABNORMAL HIGH (ref 0.8–1.2)
Prothrombin Time: 19.8 s — ABNORMAL HIGH (ref 11.4–15.2)

## 2023-12-18 LAB — MAGNESIUM: Magnesium: 2.4 mg/dL (ref 1.7–2.4)

## 2023-12-18 LAB — HEPARIN LEVEL (UNFRACTIONATED): Heparin Unfractionated: 0.19 [IU]/mL — ABNORMAL LOW (ref 0.30–0.70)

## 2023-12-18 MED ORDER — SODIUM CHLORIDE 0.9% FLUSH
3.0000 mL | Freq: Two times a day (BID) | INTRAVENOUS | Status: DC
  Administered 2023-12-19: 3 mL via INTRAVENOUS

## 2023-12-18 MED ORDER — SODIUM CHLORIDE 0.9% FLUSH
3.0000 mL | INTRAVENOUS | Status: DC | PRN
  Administered 2023-12-18: 3 mL via INTRAVENOUS

## 2023-12-18 MED ORDER — HEPARIN (PORCINE) 25000 UT/250ML-% IV SOLN
1050.0000 [IU]/h | INTRAVENOUS | Status: DC
  Administered 2023-12-18: 900 [IU]/h via INTRAVENOUS
  Administered 2023-12-19: 1050 [IU]/h via INTRAVENOUS
  Filled 2023-12-18 (×2): qty 250

## 2023-12-18 MED ORDER — OXYCODONE HCL 5 MG PO TABS
5.0000 mg | ORAL_TABLET | Freq: Three times a day (TID) | ORAL | Status: DC | PRN
  Administered 2023-12-18 – 2023-12-20 (×6): 5 mg via ORAL
  Filled 2023-12-18 (×6): qty 1

## 2023-12-18 MED ORDER — HEPARIN BOLUS VIA INFUSION
2000.0000 [IU] | Freq: Once | INTRAVENOUS | Status: AC
  Administered 2023-12-18: 2000 [IU] via INTRAVENOUS
  Filled 2023-12-18: qty 2000

## 2023-12-18 MED ORDER — POTASSIUM CHLORIDE CRYS ER 20 MEQ PO TBCR
40.0000 meq | EXTENDED_RELEASE_TABLET | Freq: Once | ORAL | Status: AC
  Administered 2023-12-18: 40 meq via ORAL
  Filled 2023-12-18: qty 2

## 2023-12-18 MED ORDER — SODIUM CHLORIDE 0.9 % IV SOLN: 250.0000 mL | INTRAVENOUS | Status: DC | PRN

## 2023-12-18 NOTE — Plan of Care (Signed)
   Problem: Education: Goal: Knowledge of General Education information will improve Description Including pain rating scale, medication(s)/side effects and non-pharmacologic comfort measures Outcome: Progressing   Problem: Health Behavior/Discharge Planning: Goal: Ability to manage health-related needs will improve Outcome: Progressing

## 2023-12-18 NOTE — H&P (View-Only) (Signed)
 Progress Note  Patient Name: Emily Rocha Date of Encounter: 12/18/2023  Primary Cardiologist: Alvan Carrier, MD  Subjective   No issues. Doing good. Has some SOB.  Inpatient Medications    Scheduled Meds:  ALPRAZolam   0.5 mg Oral TID   ascorbic acid  500 mg Oral Daily   atorvastatin   40 mg Oral Daily   budesonide   0.25 mg Nebulization Daily   empagliflozin  10 mg Oral Daily   furosemide   80 mg Intravenous TID   ipratropium-albuterol   3 mL Nebulization BID   lidocaine  HCl (PF)  10 mL Intradermal Once   magnesium  oxide  400 mg Oral Daily   metoprolol  tartrate  12.5 mg Oral BID   midodrine  5 mg Oral TID WC   neomycin-bacitracin-polymyxin   Topical TID   pantoprazole   40 mg Oral Daily   primidone  50 mg Oral Daily   sennosides  10 mL Oral BID   Continuous Infusions:  heparin  900 Units/hr (12/18/23 0833)   PRN Meds: acetaminophen  **OR** acetaminophen , HYDROcodone bit-homatropine, ipratropium-albuterol , magnesium  hydroxide, ondansetron  **OR** ondansetron  (ZOFRAN ) IV, oxyCODONE , traZODone   Vital Signs    Vitals:   12/18/23 0449 12/18/23 0455 12/18/23 0655 12/18/23 0840  BP: (!) 111/42  (!) 127/98   Pulse: 81  85 85  Resp: 20  16 18   Temp: 98.4 F (36.9 C)  97.6 F (36.4 C)   TempSrc: Oral  Oral   SpO2: 94%  94% 94%  Weight:  62.5 kg    Height:        Intake/Output Summary (Last 24 hours) at 12/18/2023 1140 Last data filed at 12/18/2023 0400 Gross per 24 hour  Intake 360 ml  Output 800 ml  Net -440 ml   Filed Weights   12/16/23 0500 12/17/23 0500 12/18/23 0455  Weight: 63.6 kg 63.3 kg 62.5 kg    Telemetry    Personally reviewed.  NSR.  ECG    Not performed today.  Physical Exam   GEN: No acute distress.   Neck: No JVD. Cardiac: RRR, no murmur, rub, or gallop.  Respiratory: Nonlabored. Clear to auscultation bilaterally. GI: Soft, nontender, bowel sounds present. MS: Pitting edema edema; No deformity. Neuro:  Nonfocal. Psych: Alert and  oriented x 3. Normal affect.  Labs    Chemistry Recent Labs  Lab 12/13/23 [phone] 12/14/23 0503 12/16/23 0455 12/17/23 0909 12/18/23 0143  NA  --    < > 135 132* 133*  K  --    < > 3.7 3.6 3.8  CL  --    < > 89* 84* 84*  CO2  --    < > 38* 38* 40*  GLUCOSE  --    < > 92 137* 104*  BUN  --    < > 11 10 10   CREATININE  --    < > 0.51 0.61 0.57  CALCIUM   --    < > 8.9 8.5* 8.5*  PROT 6.4*  --   --   --   --   ALBUMIN  3.7  --   --   --   --   AST 25  --   --   --   --   ALT 13  --   --   --   --   ALKPHOS 146*  --   --   --   --   BILITOT 0.5  --   --   --   --   GFRNONAA  --    < > >  60 >60 >60  ANIONGAP  --    < > 8 10 9    < > = values in this interval not displayed.     Hematology Recent Labs  Lab 12/12/23 0518  WBC 5.8  RBC 3.63*  HGB 10.5*  HCT 35.1*  MCV 96.7  MCH 28.9  MCHC 29.9*  RDW 15.9*  PLT 148*    Cardiac EnzymesNo results for input(s): TROPONINIHS in the last 720 hours.  BNP Recent Labs  Lab 12/15/23 0451 12/16/23 0455 12/17/23 0229  BNP  --   --  966.8*  PROBNP 3,275.0* 3,285.0*  --      DDimerNo results for input(s): DDIMER in the last 168 hours.   Radiology    US  THORACENTESIS ASP PLEURAL SPACE W/IMG GUIDE Result Date: 12/16/2023 EXAM: ULTRASOUND GUIDED RIGHT THORACENTESIS 12/16/2023 01:56:48 PM TECHNIQUE: Informed consent was obtained after a detailed explanation of the procedure including risks, benefits, and alternatives. Universal protocol was performed. The right chest was prepped and draped in sterile fashion and local anesthesia was achieved with lidocaine . An thoracentesis needle sheath was advanced under ultrasound guidance into pleural effusion and thoracentesis was performed. The patient tolerated the procedure well. COMPARISON: None provided. CLINICAL HISTORY: Loculated pleural effusion, shortness of breath FINDINGS: A total of 600 cc was removed. IMPRESSION: 1. Successful ultrasound guided right thoracentesis. Electronically  signed by: Ryan Salvage MD 12/16/2023 02:40 PM EDT RP Workstation: [id number]   DG Chest 1 View Result Date: 12/16/2023 EXAM: 1 VIEW XRAY OF THE CHEST 12/16/2023 01:58:00 PM COMPARISON: 12/08/2023 CLINICAL HISTORY: SOB (shortness of breath) FINDINGS: LUNGS AND PLEURA: Decreased trace right pleural effusion. Unchanged interstitial markings. Right partial pneumonectomy postsurgical changes. No focal pulmonary opacity. No pulmonary edema. No pneumothorax. HEART AND MEDIASTINUM: Cardiomegaly. Prosthetic aortic valve noted. Aortic calcification. BONES AND SOFT TISSUES: Sternotomy wires and surgical clips noted. No acute osseous abnormality. IMPRESSION: 1. No acute cardiopulmonary findings. 2. Cardiomegaly with prosthetic aortic valve. 3. Decreased trace right pleural effusion. 4. Stable interstitial markings. Electronically signed by: Norleen Boxer MD 12/16/2023 02:35 PM EDT RP Workstation: [id number]    Assessment & Plan     Acute on chronic diastolic heart failure - Presented with DOE, nocturnal cough, abdominal distention, pitting edema in the legs x few weeks.  No orthopnea or PND reported. - proBNP elevated, 2,590. - On IV Lasix  80 mg TID.  Missed many doses during this admission due to soft blood pressures and requiring midodrine support.  Continues to have puffy feet and legs.  SOB significantly improved after thoracentesis but still has some more today. Jardiance held today. - Due to ADHF requiring midodrine support and rate RV dysfunction, she will benefit from [facility] and RHC to rule out RCA ISR and for workup of pulmonary hypertension.  She is agreeable to [facility] and [facility] on Monday.  Informed consent for [facility] and RHC Risks and benefits of cardiac catheterization have been discussed with the patient.  These include bleeding, infection, kidney damage, stroke, heart attack, death.  The patient understands these risks and is willing to proceed. Agreed to reverse her code status just for cath  procedure.  - Prior echocardiogram from 2024 showed normal LVEF, moderate RV systolic dysfunction, moderate enlargement of RV, CVP 15 mmHg.  Echo this admission also showed similar findings of normal LV function, moderate RV systolic dysfunction, moderate RV enlargement, mild pulmonary hypertension.  Mild MR, moderate TR, CVP 15 mmHg.  RV failure History of pulm embolism in 2021 - Echo from 2024 and 2025  showed normal LV function but moderate RV systolic dysfunction and moderate RV enlargement.  Patient had pulm embolism in 2021, initially started on Eliquis  but later had to be discontinued within 1 week due to incidental finding of subdural hemorrhage (based on recommendations from neurosurgery NP) with plans to follow-up in neurosurgery office but I did not find any note that she was officially evaluated by them to resume Eliquis .  She will need VQ scan either inpatient or outpatient, to rule out CTEPH.  Atrial fibrillation with RVR - Presented with A-fib with RVR.  Prior onset in the setting of pneumonia. - Telemetry reviewed, in NSR.  Continue metoprolol  tartrate 12.5 mg twice daily. -  She was never started systemic AC until this admission. On primidone, DOACs contraindicated.  Started on Coumadin , held for [facility] and RHC on Monday. Start heparin  drip once INR<2.  Pleural effusion s/p thoracentesis on 12/16/2023 - Noted significant improvement in her breathing after thoracentesis.  Drained 600 cc pleural fluid.  CAD s/p CABG in 2018, s/p RCA PCI in 2022 - Not on aspirin  due to Coumadin  use.  Holding Coumadin  for [facility] and [facility] on Monday.  Continue atorvastatin  40 mg nightly.  Severe AS s/p TAVR in 2022 - Stable gradients.  Monitor.  COPD on home O2 - Per primary team.    30 minutes spent in reviewing prior medical records, more than 3 labs, discussion and documentation.  Signed, Diannah SHAUNNA Maywood, MD  12/18/2023, 11:40 AM

## 2023-12-18 NOTE — Progress Notes (Signed)
 Progress Note   [name] FMW:[id number] DOB: 25-Feb-1955 DOA: 12/08/2023     9 DOS: the patient was seen and examined on 12/18/2023   Brief hospital course: Emily Rocha was admitted to the hospital with the working diagnosis of heart failure exacerbation  68 yo female with the past medical history of COPD, astham, coronary artery disease, peripheral vascular disease, and sp TAVR who presented with palpitations and dyspnea. No PND, orthopnea or lower extremity edema.  On her initial physical examination her blood pressure was 136/120, HR 102, RR 24 and 02 saturation 94%  Lungs with decreased breath sounds at bases and positive bilateral rales, heart with S1 and S2 present, irregularly irregular with no gallops, rubs or murmurs, abdomen with no distention and positive lower extremity edema.   Na 139, K 4,1 Cl 93 bicarbonate 40 glucose 95 bun 12 cr 0,54  BNP 2,590  High sensitive troponin 19 and 17   Chest radiograph with cardiomegaly, bilateral hilar vascular congestion, bilateral pleural effusions, more right than left. Sternotomy wires in place  EKG 138 bpm, normal axis, qtc 511, atrial fibrillation rhythm with no significant ST segment or T wave changes.   Patient was placed on IV furosemide  for diuresis.  Echocardiogram with RV failure and severe pulmonary hypertension.  Patient was placed on midodrine for blood pressure support.  10/31 right thoracentesis 600 cc removed.   Decision was made to transfer to [facility] for cardiac catheterization.  11/01 At [facility], plan for catheterization on 11/03   Assessment and Plan: * Acute on chronic diastolic heart failure (HCC) Echocardiogram with preserved LV systolic function with EF 55 to 60%, mild LVH, interventricular septum flattened in systole and diastole, RV systolic function with moderate reduction, RVSP 39,8 mmHg, LA and RA with severe dilatation, moderate tricuspid regurgitation, sp TAVR.   Acute on chronic cor pulmonale Pulmonary  hypertension, likely group 3 RV failure with poor prognosis.   Documented urine output is 1,500 ml Systolic blood pressure 100 to 120 mmHg range.   Continue diuresis with furosemide  80 mg IV tid On SGLT 2 inh to augment diuresis.  Continue metoprolol .  Limited RAAS inhibition due to risk of hypotension.  Continue midodrine for blood pressure support.  Cardiac catheterization tomorrow   CAD (coronary artery disease)/Prior CABG No acute coronary syndrome.  Continue blood pressure monitoring   Atrial fibrillation with RVR (HCC) Continue rate control with metoprolol .  Has been on warfarin due to interaction with primidone.  Apparently her tremors have been not improved with primidone, she is ok in discontinue this and use apixaban  for anticoagulation.   Chronic obstructive pulmonary disease (COPD) (HCC) Continue bronchodilator therapy Continue inhaled corticosteroids.  No signs of acute exacerbation .  Hyponatremia Today renal function with serum cr at 0,57 with K at 3,8 and serum bicarbonate at 40  Na 133 and Mg 2.4   Add 40 meq Kcl today to prevent hypokalemia  Continue diuresis with furosemide  and SGLT 2 inh  Follow up renal function and electrolytes in am   Dyslipidemia Continue with atorvastatin    GERD without esophagitis Continue pantoprazole .,   Anxiety Continue with alprazolam .       Subjective: Patient is feeling better, dyspnea and edema have been improving, no chest pain, no PND or orthopnea, anxious to go home soon   Physical Exam: Vitals:   12/18/23 0455 12/18/23 0655 12/18/23 0840 12/18/23 1130  BP:  (!) 127/98  91/61  Pulse:  85 85 73  Resp:  16 18  18  Temp:  97.6 F (36.4 C)  97.7 F (36.5 C)  TempSrc:  Oral  Oral  SpO2:  94% 94% 97%  Weight: 62.5 kg     Height:       Neurology awake and alert ENT with mild pallor Cardiovascular with S1 and S2 present, irregularly irregular with no gallops or rubs, positive systolic murmur at the left lower  sternal border Positive JVD Respiratory with scattered rales and prolonged expiratory phase with no wheezing or rhonchi  Abdomen with no distention  Trace lower extremity edema, ted hose in place   Data Reviewed:    Family Communication: I spoke with patient's husband at the bedside, we talked in detail about patient's condition, plan of care and prognosis and all questions were addressed.   Disposition: Status is: Inpatient Remains inpatient appropriate because: pending cardiac catheterization   Planned Discharge Destination: Home     Author: Elidia Toribio Furnace, MD 12/18/2023 12:44 PM  For on call review [email or web address].

## 2023-12-18 NOTE — Progress Notes (Addendum)
 Progress Note  Patient Name: Emily Rocha Date of Encounter: 12/18/2023  Primary Cardiologist: Alvan Carrier, MD  Subjective   No issues. Doing good. Has some SOB.  Inpatient Medications    Scheduled Meds:  ALPRAZolam   0.5 mg Oral TID   ascorbic acid  500 mg Oral Daily   atorvastatin   40 mg Oral Daily   budesonide   0.25 mg Nebulization Daily   empagliflozin  10 mg Oral Daily   furosemide   80 mg Intravenous TID   ipratropium-albuterol   3 mL Nebulization BID   lidocaine  HCl (PF)  10 mL Intradermal Once   magnesium  oxide  400 mg Oral Daily   metoprolol  tartrate  12.5 mg Oral BID   midodrine  5 mg Oral TID WC   neomycin-bacitracin-polymyxin   Topical TID   pantoprazole   40 mg Oral Daily   primidone  50 mg Oral Daily   sennosides  10 mL Oral BID   Continuous Infusions:  heparin  900 Units/hr (12/18/23 0833)   PRN Meds: acetaminophen  **OR** acetaminophen , HYDROcodone bit-homatropine, ipratropium-albuterol , magnesium  hydroxide, ondansetron  **OR** ondansetron  (ZOFRAN ) IV, oxyCODONE , traZODone   Vital Signs    Vitals:   12/18/23 0449 12/18/23 0455 12/18/23 0655 12/18/23 0840  BP: (!) 111/42  (!) 127/98   Pulse: 81  85 85  Resp: 20  16 18   Temp: 98.4 F (36.9 C)  97.6 F (36.4 C)   TempSrc: Oral  Oral   SpO2: 94%  94% 94%  Weight:  62.5 kg    Height:        Intake/Output Summary (Last 24 hours) at 12/18/2023 1140 Last data filed at 12/18/2023 0400 Gross per 24 hour  Intake 360 ml  Output 800 ml  Net -440 ml   Filed Weights   12/16/23 0500 12/17/23 0500 12/18/23 0455  Weight: 63.6 kg 63.3 kg 62.5 kg    Telemetry    Personally reviewed.  NSR.  ECG    Not performed today.  Physical Exam   GEN: No acute distress.   Neck: No JVD. Cardiac: RRR, no murmur, rub, or gallop.  Respiratory: Nonlabored. Clear to auscultation bilaterally. GI: Soft, nontender, bowel sounds present. MS: Pitting edema edema; No deformity. Neuro:  Nonfocal. Psych: Alert and  oriented x 3. Normal affect.  Labs    Chemistry Recent Labs  Lab 12/13/23 [phone] 12/14/23 0503 12/16/23 0455 12/17/23 0909 12/18/23 0143  NA  --    < > 135 132* 133*  K  --    < > 3.7 3.6 3.8  CL  --    < > 89* 84* 84*  CO2  --    < > 38* 38* 40*  GLUCOSE  --    < > 92 137* 104*  BUN  --    < > 11 10 10   CREATININE  --    < > 0.51 0.61 0.57  CALCIUM   --    < > 8.9 8.5* 8.5*  PROT 6.4*  --   --   --   --   ALBUMIN  3.7  --   --   --   --   AST 25  --   --   --   --   ALT 13  --   --   --   --   ALKPHOS 146*  --   --   --   --   BILITOT 0.5  --   --   --   --   GFRNONAA  --    < > >  60 >60 >60  ANIONGAP  --    < > 8 10 9    < > = values in this interval not displayed.     Hematology Recent Labs  Lab 12/12/23 0518  WBC 5.8  RBC 3.63*  HGB 10.5*  HCT 35.1*  MCV 96.7  MCH 28.9  MCHC 29.9*  RDW 15.9*  PLT 148*    Cardiac EnzymesNo results for input(s): TROPONINIHS in the last 720 hours.  BNP Recent Labs  Lab 12/15/23 0451 12/16/23 0455 12/17/23 0229  BNP  --   --  966.8*  PROBNP 3,275.0* 3,285.0*  --      DDimerNo results for input(s): DDIMER in the last 168 hours.   Radiology    US  THORACENTESIS ASP PLEURAL SPACE W/IMG GUIDE Result Date: 12/16/2023 EXAM: ULTRASOUND GUIDED RIGHT THORACENTESIS 12/16/2023 01:56:48 PM TECHNIQUE: Informed consent was obtained after a detailed explanation of the procedure including risks, benefits, and alternatives. Universal protocol was performed. The right chest was prepped and draped in sterile fashion and local anesthesia was achieved with lidocaine . An thoracentesis needle sheath was advanced under ultrasound guidance into pleural effusion and thoracentesis was performed. The patient tolerated the procedure well. COMPARISON: None provided. CLINICAL HISTORY: Loculated pleural effusion, shortness of breath FINDINGS: A total of 600 cc was removed. IMPRESSION: 1. Successful ultrasound guided right thoracentesis. Electronically  signed by: Ryan Salvage MD 12/16/2023 02:40 PM EDT RP Workstation: [id number]   DG Chest 1 View Result Date: 12/16/2023 EXAM: 1 VIEW XRAY OF THE CHEST 12/16/2023 01:58:00 PM COMPARISON: 12/08/2023 CLINICAL HISTORY: SOB (shortness of breath) FINDINGS: LUNGS AND PLEURA: Decreased trace right pleural effusion. Unchanged interstitial markings. Right partial pneumonectomy postsurgical changes. No focal pulmonary opacity. No pulmonary edema. No pneumothorax. HEART AND MEDIASTINUM: Cardiomegaly. Prosthetic aortic valve noted. Aortic calcification. BONES AND SOFT TISSUES: Sternotomy wires and surgical clips noted. No acute osseous abnormality. IMPRESSION: 1. No acute cardiopulmonary findings. 2. Cardiomegaly with prosthetic aortic valve. 3. Decreased trace right pleural effusion. 4. Stable interstitial markings. Electronically signed by: Norleen Boxer MD 12/16/2023 02:35 PM EDT RP Workstation: [id number]    Assessment & Plan     Acute on chronic diastolic heart failure - Presented with DOE, nocturnal cough, abdominal distention, pitting edema in the legs x few weeks.  No orthopnea or PND reported. - proBNP elevated, 2,590. - On IV Lasix  80 mg TID.  Missed many doses during this admission due to soft blood pressures and requiring midodrine support.  Continues to have puffy feet and legs.  SOB significantly improved after thoracentesis but still has some more today. Jardiance held today. - Due to ADHF requiring midodrine support and rate RV dysfunction, she will benefit from [facility] and RHC to rule out RCA ISR and for workup of pulmonary hypertension.  She is agreeable to [facility] and [facility] on Monday.  Informed consent for [facility] and RHC Risks and benefits of cardiac catheterization have been discussed with the patient.  These include bleeding, infection, kidney damage, stroke, heart attack, death.  The patient understands these risks and is willing to proceed. Agreed to reverse her code status just for cath  procedure.  - Prior echocardiogram from 2024 showed normal LVEF, moderate RV systolic dysfunction, moderate enlargement of RV, CVP 15 mmHg.  Echo this admission also showed similar findings of normal LV function, moderate RV systolic dysfunction, moderate RV enlargement, mild pulmonary hypertension.  Mild MR, moderate TR, CVP 15 mmHg.  RV failure History of pulm embolism in 2021 - Echo from 2024 and 2025  showed normal LV function but moderate RV systolic dysfunction and moderate RV enlargement.  Patient had pulm embolism in 2021, initially started on Eliquis  but later had to be discontinued within 1 week due to incidental finding of subdural hemorrhage (based on recommendations from neurosurgery NP) with plans to follow-up in neurosurgery office but I did not find any note that she was officially evaluated by them to resume Eliquis .  She will need VQ scan either inpatient or outpatient, to rule out CTEPH.  Atrial fibrillation with RVR - Presented with A-fib with RVR.  Prior onset in the setting of pneumonia. - Telemetry reviewed, in NSR.  Continue metoprolol  tartrate 12.5 mg twice daily. -  She was never started systemic AC until this admission. On primidone, DOACs contraindicated.  Started on Coumadin , held for [facility] and RHC on Monday. Start heparin  drip once INR<2.  Pleural effusion s/p thoracentesis on 12/16/2023 - Noted significant improvement in her breathing after thoracentesis.  Drained 600 cc pleural fluid.  CAD s/p CABG in 2018, s/p RCA PCI in 2022 - Not on aspirin  due to Coumadin  use.  Holding Coumadin  for [facility] and [facility] on Monday.  Continue atorvastatin  40 mg nightly.  Severe AS s/p TAVR in 2022 - Stable gradients.  Monitor.  COPD on home O2 - Per primary team.    30 minutes spent in reviewing prior medical records, more than 3 labs, discussion and documentation.  Signed, Diannah SHAUNNA Maywood, MD  12/18/2023, 11:40 AM

## 2023-12-18 NOTE — Plan of Care (Signed)

## 2023-12-18 NOTE — Progress Notes (Signed)
 PHARMACY - ANTICOAGULATION CONSULT NOTE  Pharmacy Consult for warfarin>>heparin  Indication: atrial fibrillation  Allergies  Allergen Reactions   Levaquin  [Levofloxacin ] Swelling    Patient Measurements: Height: 5' 4 (162.6 cm) Weight: 62.5 kg (137 lb 12.6 oz) IBW/kg (Calculated) : 54.7 HEPARIN  DW (KG): 66.8  Vital Signs: Temp: 98.4 F (36.9 C) (11/02 0449) Temp Source: Oral (11/02 0449) BP: 111/42 (11/02 0449) Pulse Rate: 81 (11/02 0449)  Labs: Recent Labs    12/16/23 0455 12/17/23 0230 12/17/23 0909 12/18/23 0143  LABPROT 28.1* 26.0*  --  19.8*  INR 2.5* 2.3*  --  1.6*  CREATININE 0.51  --  0.61 0.57    Estimated Creatinine Clearance: 56.5 mL/min (by C-G formula based on SCr of 0.57 mg/dL).   Medical History: Past Medical History:  Diagnosis Date   Anxiety    Asthma    COPD (chronic obstructive pulmonary disease) (HCC)    Coronary artery disease    a. s/p NSTEMI in 11/2016 and required CABG with LIMA-LAD, SVG-LCx and SVG-D1 and complicated by cardiogenic shock b. cath in 05/2020 showing severe RCA stenosis and treated with orbital atherectomy and stent placement   GERD (gastroesophageal reflux disease)    History of CVA (cerebrovascular accident)    History of lung cancer 2020   Peripheral vascular disease    S/P TAVR (transcatheter aortic valve replacement) 08/12/2020   s/p TAVR with a 23 mm Edwards S3U via the TF approach by Dr. Verlin & Dr. Lucas   Severe aortic stenosis     Assessment: Pharmacy consulted to dose warfarin in patient with atrial fibrillation.  Patient is primidone prior to admission which is contraindicated with Eliquis - could consider Eliquis  in future if patient stops taking primidone.    Plans for right heart cath and pharmacy to start heparin  when INR < 2.0 -INR= 2.3> 1.6   Goal of Therapy:  INR 2-3 Monitor platelets by anticoagulation protocol: Yes   Plan:  -Heparin  bolus 2000 units x1 then start infusion at 900  units/hr -Heparin  level in 8 hours and daily wth CBC daily -Daily PT/INR  Prentice Poisson, PharmD Clinical Pharmacist **Pharmacist phone directory can now be found on [email or web address] ([email or web address]).  Listed under [facility].

## 2023-12-18 NOTE — Progress Notes (Signed)
 PHARMACY - ANTICOAGULATION CONSULT NOTE  Pharmacy Consult for warfarin>>heparin  Indication: atrial fibrillation  Allergies  Allergen Reactions   Levaquin  [Levofloxacin ] Swelling    Patient Measurements: Height: 5' 4 (162.6 cm) Weight: 62.5 kg (137 lb 12.6 oz) IBW/kg (Calculated) : 54.7 HEPARIN  DW (KG): 66.8  Vital Signs: Temp: 97.7 F (36.5 C) (11/02 1542) Temp Source: Oral (11/02 1542) BP: 119/50 (11/02 1542) Pulse Rate: 71 (11/02 1542)  Labs: Recent Labs    12/16/23 0455 12/17/23 0230 12/17/23 0909 12/18/23 0143 12/18/23 1556  LABPROT 28.1* 26.0*  --  19.8*  --   INR 2.5* 2.3*  --  1.6*  --   HEPARINUNFRC  --   --   --   --  0.19*  CREATININE 0.51  --  0.61 0.57  --     Estimated Creatinine Clearance: 56.5 mL/min (by C-G formula based on SCr of 0.57 mg/dL).   Medical History: Past Medical History:  Diagnosis Date   Anxiety    Asthma    COPD (chronic obstructive pulmonary disease) (HCC)    Coronary artery disease    a. s/p NSTEMI in 11/2016 and required CABG with LIMA-LAD, SVG-LCx and SVG-D1 and complicated by cardiogenic shock b. cath in 05/2020 showing severe RCA stenosis and treated with orbital atherectomy and stent placement   GERD (gastroesophageal reflux disease)    History of CVA (cerebrovascular accident)    History of lung cancer 2020   Peripheral vascular disease    S/P TAVR (transcatheter aortic valve replacement) 08/12/2020   s/p TAVR with a 23 mm Edwards S3U via the TF approach by Dr. Verlin & Dr. Lucas   Severe aortic stenosis     Assessment: Pharmacy consulted to dose warfarin in patient with atrial fibrillation.  Patient is primidone prior to admission which is contraindicated with Eliquis - could consider Eliquis  in future if patient stops taking primidone.    Plans for right heart cath and pharmacy to start heparin  when INR < 2.0 -INR= 2.3> 1.6  11/2 PM: Heparin  level 0.19. No issues with infusion or bleeding noted, will increase  rate.   Goal of Therapy:  INR 2-3 Heparin  Level: 0.3-0.7 Monitor platelets by anticoagulation protocol: Yes   Plan:  -Increase heparin  infusion to 1050 units/hr. -Heparin  level with AM labs and daily wth CBC daily -Daily PT/INR  Larraine Brazier, PharmD Clinical Pharmacist 12/18/2023  4:51 PM **Pharmacist phone directory can now be found on [email or web address] ([email or web address]).  Listed under [facility].

## 2023-12-18 NOTE — Consult Note (Signed)
 Palliative Medicine Inpatient Consult Note  Consulting Provider: Willette Adriana LABOR, MD   Reason for consult:   Palliative Care Consult Services Palliative Medicine Consult   Symptom Management Consult  Reason for Consult? goals of care    12/18/2023  HPI:  Per intake H&P --> 68 yo female with the past medical history of COPD, astham, coronary artery disease, peripheral vascular disease, and sp TAVR who presented with palpitations and dyspnea.Palliative care has been asked to support additional goals of care conversations in the setting of high chronic disease burden.   Clinical Assessment/Goals of Care:  *Please note that this is a verbal dictation therefore any spelling or grammatical errors are due to the Dragon Medical One system interpretation.  I have reviewed medical records including EPIC notes, labs and imaging, received report from bedside RN, assessed the patient who is sitting up at the time of assessment getting a breathing treatment.  Tacara is hard of hearing and is able to hear better out of the left ear.    I met with Marise this morning to further discuss diagnosis prognosis, GOC, EOL wishes, disposition and options.   I introduced Palliative Medicine as specialized medical care for people living with serious illness. It focuses on providing relief from the symptoms and stress of a serious illness. The goal is to improve quality of life for both the patient and the family.  Medical History Review and Understanding:  A review of Teigen's past medical history significant for COPD, coronary artery disease status post CABG, TAVR, Afib, peripheral vascular disease, and anxiety was held.  Social History:  Vanita shares that she was born in [place]  though has been the majority of her adult life in [place] .  She and her husband married at 65, his name is Dexter and they have been Wedd for the past 10 years.  Myliyah does have a son who is 70  years old and a grandson who is 61 years old.  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  Magen has a tremendous love for her dog, her family, and travel.  She gets the greatest joy out of going to [facility].  Functional and Nutritional State:  Prior to admission Neola shares she was able to perform all B ADLs and IADLs independently.  Sherial shares that her appetite has been and remains to be fair.  Advance Directives:  A detailed discussion was had today regarding advanced directives.  Manuela has not creative advance directives though is interested in doing so while hospitalized.  Code Status:  Concepts specific to code status, artifical feeding and hydration, continued IV antibiotics and rehospitalization was had.  The difference between a aggressive medical intervention path  and a palliative comfort care path for this patient at this time was had.   Tisheena is an established DO NOT RESUSCITATE DO NOT INTUBATE CODE STATUS.  She shares that about a year ago she was intubated and she would prefer to not have to go through that ever again.  Due to her prior heart procedures she understands the gravity of doing interventions such as chest compressions and/or shocks and would not want those either.  Discussion:  Carigan shares with me that she has a history significant for COPD, 2 hernias causing acid reflux, 2 open heart surgical  repairs, and heart failure.  She shares that since her heart surgery she has been more susceptible to pneumonias and notes that she tends to get them annually now.  Kent shares that the reason for admission was her feeling more short of breath and swelling to the point where she looked a 9 months pregnant and her feet look 3 times the size they normally are.  Jan and I reviewed her heart failure and COPD.  We  discussed the chronic progressive nature of both diseases.  We discussed that despite being medically managed they progress and worsen over time.  We discussed briefly potential option should her disease burden worsen.  As of this point in time Ellyn is hopeful that her cardiac catheterizations tomorrow proved to be helpful and if she requires stent placement that this would improve her quality of life.  She asked me specifically if this is the anticipation and I shared this is a question I would direct towards the cardiology team though I would bring this to the attention of the hospitalist.  Patient's goals are for improvement.  She notes symptomatically she feels tremendously better after her thoracentesis 2 days ago.  Discussed the importance of continued conversation with family and their  medical providers regarding overall plan of care and treatment options, ensuring decisions are within the context of the patients values and GOCs.  Decision Maker: Tschantz,Dexter (Spouse): [phone] (Mobile)   SUMMARY OF RECOMMENDATIONS   DNAR/DNI  Appreciate chaplain helping with advance care directives  Daisie and I discussed the chronic progressive nature f COPD and heart failure  Jenai is hopeful the cardiac catheterization tomorrow is revealing in terms of how to optimize her health and relieve her symptoms  Emaree would like to meet with the cardiology team to better understand the procedure tomorrow and how this would improve her quality of life  Ongoing palliative care support  Code Status/Advance Care Planning: DNAR/DNI  Palliative Prophylaxis:  Aspiration, Bowel Regimen, Delirium Protocol, Frequent Pain Assessment, Oral Care, Palliative Wound Care, and Turn Reposition  Additional Recommendations (Limitations, Scope, Preferences): Continue current care  Psycho-social/Spiritual:  Desire for further Chaplaincy support: Yes Additional Recommendations: Education on disease burden  provided   Prognosis: Based upon Mykayla being hospitalized, having high mobility and function she has roughly a 30% mortality risk in the next six months based upon eprognosis  Discharge Planning: Discharge to home once medically optimized.  Vitals:   12/18/23 0052 12/18/23 0449  BP: 91/60 (!) 111/42  Pulse: 72 81  Resp: 20 20  Temp: 98.4 F (36.9 C) 98.4 F (36.9 C)  SpO2: 97% 94%    Intake/Output Summary (Last 24 hours) at 12/18/2023 0723 Last data filed at 12/18/2023 0400 Gross per 24 hour  Intake 600 ml  Output 1500 ml  Net -900 ml   Last Weight  Most recent update: 12/18/2023  4:56 AM    Weight  62.5 kg (137 lb 12.6 oz)             LABS: CBC:    Component Value Date/Time   WBC 5.8 12/12/2023 0518   HGB 10.5 (L) 12/12/2023 0518   HGB 11.5 06/09/2020 1140   HCT 35.1 (L) 12/12/2023 0518   HCT 38.1 06/09/2020 1140   PLT 148 (L) 12/12/2023 0518   PLT 225 06/09/2020 1140   MCV 96.7 12/12/2023 0518   MCV 76 (L) 06/09/2020 1140   NEUTROABS 5.8 08/08/2023 0953   LYMPHSABS 1.4 08/08/2023 0953   MONOABS 0.7 08/08/2023 9046  EOSABS 0.0 08/08/2023 0953   BASOSABS 0.1 08/08/2023 0953   Comprehensive Metabolic Panel:    Component Value Date/Time   NA 133 (L) 12/18/2023 0143   NA 140 06/17/2020 0955   K 3.8 12/18/2023 0143   CL 84 (L) 12/18/2023 0143   CO2 40 (H) 12/18/2023 0143   BUN 10 12/18/2023 0143   BUN 11 06/17/2020 0955   CREATININE 0.57 12/18/2023 0143   GLUCOSE 104 (H) 12/18/2023 0143   CALCIUM  8.5 (L) 12/18/2023 0143   AST 25 12/13/2023 0953   ALT 13 12/13/2023 0953   ALKPHOS 146 (H) 12/13/2023 0953   BILITOT 0.5 12/13/2023 0953   PROT 6.4 (L) 12/13/2023 0953   ALBUMIN  3.7 12/13/2023 0953    Gen:  Elderly Caucasian F chronically ill HEENT: moist mucous membranes CV: Regular rate and irregular rhythm  PULM: On 5LPM [place], breathing is nonlabored ABD: soft/nontender EXT: No edema Neuro: Alert and oriented x3  PPS:   This conversation/these  recommendations were discussed with patient primary care team, Dr. Noralee ______________________________________________________ Rosaline Becton [facility] Palliative Medicine Team Team Cell Phone: [phone] Please utilize secure chat with additional questions, if there is no response within 30 minutes please call the above phone number  Total Time: 75 Billing based on MDM: High  Palliative Medicine Team providers are available by phone from 7am to 7pm daily and can be reached through the team cell phone.  Should this patient require assistance outside of these hours, please call the patient's attending physician.

## 2023-12-19 ENCOUNTER — Inpatient Hospital Stay (HOSPITAL_COMMUNITY)
Admission: EM | Disposition: A | Discharge status: Home Health | Payer: Self-pay | Source: Home / Self Care | Attending: Internal Medicine

## 2023-12-19 DIAGNOSIS — I5033 Acute on chronic diastolic (congestive) heart failure: Secondary | ICD-10-CM | POA: Diagnosis not present

## 2023-12-19 DIAGNOSIS — J439 Emphysema, unspecified: Secondary | ICD-10-CM | POA: Diagnosis not present

## 2023-12-19 DIAGNOSIS — Z515 Encounter for palliative care: Secondary | ICD-10-CM | POA: Diagnosis not present

## 2023-12-19 DIAGNOSIS — I4891 Unspecified atrial fibrillation: Secondary | ICD-10-CM | POA: Diagnosis not present

## 2023-12-19 DIAGNOSIS — Z7189 Other specified counseling: Secondary | ICD-10-CM | POA: Diagnosis not present

## 2023-12-19 DIAGNOSIS — I2510 Atherosclerotic heart disease of native coronary artery without angina pectoris: Secondary | ICD-10-CM | POA: Diagnosis not present

## 2023-12-19 HISTORY — PX: RIGHT/LEFT HEART CATH AND CORONARY/GRAFT ANGIOGRAPHY: CATH118267

## 2023-12-19 LAB — POCT I-STAT 7, (LYTES, BLD GAS, ICA,H+H)
Acid-Base Excess: 17 mmol/L — ABNORMAL HIGH (ref 0.0–2.0)
Acid-Base Excess: 9 mmol/L — ABNORMAL HIGH (ref 0.0–2.0)
Acid-Base Excess: 15 mmol/L — ABNORMAL HIGH (ref 0.0–2.0)
Bicarbonate: 35.6 mmol/L — ABNORMAL HIGH (ref 20.0–28.0)
Bicarbonate: 45 mmol/L — ABNORMAL HIGH (ref 20.0–28.0)
Bicarbonate: 42.4 mmol/L — ABNORMAL HIGH (ref 20.0–28.0)
Calcium, Ion: 0.84 mmol/L — CL (ref 1.15–1.40)
Calcium, Ion: 1.13 mmol/L — ABNORMAL LOW (ref 1.15–1.40)
Calcium, Ion: 1 mmol/L — ABNORMAL LOW (ref 1.15–1.40)
HCT: 27 % — ABNORMAL LOW (ref 36.0–46.0)
HCT: 33 % — ABNORMAL LOW (ref 36.0–46.0)
HCT: 30 % — ABNORMAL LOW (ref 36.0–46.0)
Hemoglobin: 11.2 g/dL — ABNORMAL LOW (ref 12.0–15.0)
Hemoglobin: 9.2 g/dL — ABNORMAL LOW (ref 12.0–15.0)
Hemoglobin: 10.2 g/dL — ABNORMAL LOW (ref 12.0–15.0)
O2 Saturation: 78 %
O2 Saturation: 81 %
O2 Saturation: 76 %
Potassium: 3 mmol/L — ABNORMAL LOW (ref 3.5–5.1)
Potassium: 3.9 mmol/L (ref 3.5–5.1)
Potassium: 3.6 mmol/L (ref 3.5–5.1)
Sodium: 130 mmol/L — ABNORMAL LOW (ref 135–145)
Sodium: 139 mmol/L (ref 135–145)
Sodium: 133 mmol/L — ABNORMAL LOW (ref 135–145)
TCO2: 38 mmol/L — ABNORMAL HIGH (ref 22–32)
TCO2: 47 mmol/L — ABNORMAL HIGH (ref 22–32)
TCO2: 45 mmol/L — ABNORMAL HIGH (ref 22–32)
pCO2 arterial: 64.4 mmHg — ABNORMAL HIGH (ref 32–48)
pCO2 arterial: 76.2 mmHg (ref 32–48)
pCO2 arterial: 73.1 mmHg (ref 32–48)
pH, Arterial: 7.351 (ref 7.35–7.45)
pH, Arterial: 7.379 (ref 7.35–7.45)
pH, Arterial: 7.372 (ref 7.35–7.45)
pO2, Arterial: 46 mmHg — ABNORMAL LOW (ref 83–108)
pO2, Arterial: 49 mmHg — ABNORMAL LOW (ref 83–108)
pO2, Arterial: 44 mmHg — ABNORMAL LOW (ref 83–108)

## 2023-12-19 LAB — MAGNESIUM: Magnesium: 2.3 mg/dL (ref 1.7–2.4)

## 2023-12-19 LAB — PROTIME-INR
INR: 1.4 — ABNORMAL HIGH (ref 0.8–1.2)
Prothrombin Time: 17.4 s — ABNORMAL HIGH (ref 11.4–15.2)

## 2023-12-19 LAB — POCT I-STAT EG7
Acid-Base Excess: 15 mmol/L — ABNORMAL HIGH (ref 0.0–2.0)
Bicarbonate: 43.5 mmol/L — ABNORMAL HIGH (ref 20.0–28.0)
Calcium, Ion: 1.04 mmol/L — ABNORMAL LOW (ref 1.15–1.40)
HCT: 31 % — ABNORMAL LOW (ref 36.0–46.0)
Hemoglobin: 10.5 g/dL — ABNORMAL LOW (ref 12.0–15.0)
O2 Saturation: 51 %
Potassium: 3.7 mmol/L (ref 3.5–5.1)
Sodium: 132 mmol/L — ABNORMAL LOW (ref 135–145)
TCO2: 46 mmol/L — ABNORMAL HIGH (ref 22–32)
pCO2, Ven: 77.3 mmHg (ref 44–60)
pH, Ven: 7.358 (ref 7.25–7.43)
pO2, Ven: 30 mmHg — CL (ref 32–45)

## 2023-12-19 LAB — BASIC METABOLIC PANEL WITH GFR
Anion gap: 10 (ref 5–15)
BUN: 12 mg/dL (ref 8–23)
CO2: 38 mmol/L — ABNORMAL HIGH (ref 22–32)
Calcium: 8.6 mg/dL — ABNORMAL LOW (ref 8.9–10.3)
Chloride: 83 mmol/L — ABNORMAL LOW (ref 98–111)
Creatinine, Ser: 0.71 mg/dL (ref 0.44–1.00)
GFR, Estimated: >60 mL/min (ref >60)
Glucose, Bld: 101 mg/dL — ABNORMAL HIGH (ref 70–99)
Potassium: 4.7 mmol/L (ref 3.5–5.1)
Sodium: 131 mmol/L — ABNORMAL LOW (ref 135–145)

## 2023-12-19 LAB — CBC
HCT: 34.1 % — ABNORMAL LOW (ref 36.0–46.0)
Hemoglobin: 10.2 g/dL — ABNORMAL LOW (ref 12.0–15.0)
MCH: 28.8 pg (ref 26.0–34.0)
MCHC: 29.9 g/dL — ABNORMAL LOW (ref 30.0–36.0)
MCV: 96.3 fL (ref 80.0–100.0)
Platelets: 186 K/uL (ref 150–400)
RBC: 3.54 MIL/uL — ABNORMAL LOW (ref 3.87–5.11)
RDW: 15.3 % (ref 11.5–15.5)
WBC: 6.2 K/uL (ref 4.0–10.5)
nRBC: 0 % (ref 0.0–0.2)

## 2023-12-19 LAB — HEPARIN LEVEL (UNFRACTIONATED): Heparin Unfractionated: 0.38 [IU]/mL (ref 0.30–0.70)

## 2023-12-19 MED ORDER — MIDAZOLAM HCL 2 MG/2ML IJ SOLN
INTRAMUSCULAR | Status: AC
  Filled 2023-12-19: qty 2

## 2023-12-19 MED ORDER — VERAPAMIL HCL 2.5 MG/ML IV SOLN
INTRAVENOUS | Status: AC
  Filled 2023-12-19: qty 2

## 2023-12-19 MED ORDER — LIDOCAINE HCL (PF) 1 % IJ SOLN
INTRAMUSCULAR | Status: DC | PRN
Start: 2023-12-19 — End: 2023-12-19
  Administered 2023-12-19 (×3): 2 mL via INTRADERMAL

## 2023-12-19 MED ORDER — NOREPINEPHRINE 4 MG/250ML-% IV SOLN
INTRAVENOUS | Status: AC
  Filled 2023-12-19: qty 250

## 2023-12-19 MED ORDER — SODIUM CHLORIDE 0.9% FLUSH
3.0000 mL | Freq: Two times a day (BID) | INTRAVENOUS | Status: DC
  Administered 2023-12-19 (×2): 3 mL via INTRAVENOUS

## 2023-12-19 MED ORDER — ONDANSETRON HCL 4 MG/2ML IJ SOLN: 4.0000 mg | Freq: Four times a day (QID) | INTRAMUSCULAR | Status: DC | PRN

## 2023-12-19 MED ORDER — WARFARIN - PHARMACIST DOSING INPATIENT: Freq: Every day | Status: DC

## 2023-12-19 MED ORDER — WARFARIN SODIUM 5 MG PO TABS
5.0000 mg | ORAL_TABLET | Freq: Once | ORAL | Status: AC
  Administered 2023-12-19: 5 mg via ORAL
  Filled 2023-12-19: qty 1

## 2023-12-19 MED ORDER — APIXABAN 5 MG PO TABS
5.0000 mg | ORAL_TABLET | Freq: Two times a day (BID) | ORAL | Status: DC
  Administered 2023-12-19 – 2023-12-20 (×2): 5 mg via ORAL
  Filled 2023-12-19 (×2): qty 1

## 2023-12-19 MED ORDER — FENTANYL CITRATE (PF) 100 MCG/2ML IJ SOLN
INTRAMUSCULAR | Status: DC | PRN
  Administered 2023-12-19: 25 ug via INTRAVENOUS

## 2023-12-19 MED ORDER — ASPIRIN 81 MG PO CHEW
81.0000 mg | CHEWABLE_TABLET | Freq: Once | ORAL | Status: AC
  Administered 2023-12-19: 81 mg via ORAL
  Filled 2023-12-19: qty 1

## 2023-12-19 MED ORDER — LABETALOL HCL 5 MG/ML IV SOLN: 10.0000 mg | INTRAVENOUS | Status: AC | PRN

## 2023-12-19 MED ORDER — LIDOCAINE HCL (PF) 1 % IJ SOLN
INTRAMUSCULAR | Status: AC
  Filled 2023-12-19: qty 30

## 2023-12-19 MED ORDER — SODIUM CHLORIDE 0.9 % IV SOLN: 250.0000 mL | INTRAVENOUS | Status: DC | PRN

## 2023-12-19 MED ORDER — HEPARIN SODIUM (PORCINE) 1000 UNIT/ML IJ SOLN
INTRAMUSCULAR | Status: AC
  Filled 2023-12-19: qty 10

## 2023-12-19 MED ORDER — HEPARIN (PORCINE) IN NACL 1000-0.9 UT/500ML-% IV SOLN
INTRAVENOUS | Status: DC | PRN
  Administered 2023-12-19: 500 mL

## 2023-12-19 MED ORDER — VERAPAMIL HCL 2.5 MG/ML IV SOLN
INTRAVENOUS | Status: DC | PRN
  Administered 2023-12-19 (×2): 10 mL via INTRA_ARTERIAL

## 2023-12-19 MED ORDER — FENTANYL CITRATE (PF) 100 MCG/2ML IJ SOLN
INTRAMUSCULAR | Status: AC
  Filled 2023-12-19: qty 2

## 2023-12-19 MED ORDER — ACETAMINOPHEN 325 MG PO TABS: 650.0000 mg | ORAL_TABLET | ORAL | Status: DC | PRN

## 2023-12-19 MED ORDER — HYDRALAZINE HCL 20 MG/ML IJ SOLN: 10.0000 mg | INTRAMUSCULAR | Status: AC | PRN

## 2023-12-19 MED ORDER — HEPARIN SODIUM (PORCINE) 1000 UNIT/ML IJ SOLN
INTRAMUSCULAR | Status: DC | PRN
  Administered 2023-12-19: 3000 [IU] via INTRAVENOUS

## 2023-12-19 MED ORDER — IOHEXOL 350 MG/ML SOLN
INTRAVENOUS | Status: DC | PRN
Start: 2023-12-19 — End: 2023-12-19
  Administered 2023-12-19: 55 mL

## 2023-12-19 MED ORDER — SODIUM CHLORIDE 0.9% FLUSH: 3.0000 mL | INTRAVENOUS | Status: DC | PRN

## 2023-12-19 MED ORDER — MIDAZOLAM HCL (PF) 2 MG/2ML IJ SOLN
INTRAMUSCULAR | Status: DC | PRN
  Administered 2023-12-19: 2 mg via INTRAVENOUS

## 2023-12-19 NOTE — Progress Notes (Signed)
 Progress Note  Patient Name: Emily Rocha Date of Encounter: 12/19/2023 [facility] Cardiologist: Emily Carrier, MD   Interval Summary   Patient seen and examined at her bedside. Family at the bedside. She is post cardiac cath.   Vital Signs Vitals:   12/19/23 0406 12/19/23 0725 12/19/23 1017 12/19/23 1054  BP:  (!) 116/53    Pulse:  75    Resp:  18    Temp:  97.8 F (36.6 C)    TempSrc:  Oral    SpO2:  94% (!) 89% (!) 82%  Weight: 63.6 kg     Height:        Intake/Output Summary (Last 24 hours) at 12/19/2023 1121 Last data filed at 12/19/2023 0920 Gross per 24 hour  Intake 243 ml  Output 2050 ml  Net -1807 ml      12/19/2023    4:06 AM 12/18/2023    4:55 AM 12/17/2023    5:00 AM  Last 3 Weights  Weight (lbs) 140 lb 3.4 oz 137 lb 12.6 oz 139 lb 9.6 oz  Weight (kg) 63.6 kg 62.5 kg 63.322 kg      Telemetry/ECG   - Personally Reviewed  Physical Exam  GEN: No acute distress.   Neck: No JVD Cardiac: RRR, no murmurs, rubs, or gallops.  Respiratory: Clear to auscultation bilaterally. GI: Soft, nontender, non-distended  MS: No edema  Echocardiogram 12/11/23 IMPRESSIONS    1. Left ventricular ejection fraction, by estimation, is 55 to 60%. The  left ventricle has normal function. The left ventricle has no regional  wall motion abnormalities. There is mild asymmetric left ventricular  hypertrophy. Left ventricular diastolic  function could not be evaluated. There is the interventricular septum is  flattened in systole and diastole, consistent with right ventricular  pressure and volume overload.   2. Right ventricular systolic function is moderately reduced. The right  ventricular size is moderately enlarged. There is mildly elevated  pulmonary artery systolic pressure. The estimated right ventricular  systolic pressure is 39.8 mmHg.   3. Left atrial size was severely dilated.   4. Right atrial size was severely dilated.   5. The mitral valve is  degenerative. Mild mitral valve regurgitation.  Moderate to severe mitral annular calcification.   6. Tricuspid valve regurgitation is moderate.   7. Mild paravalvular leak. The aortic valve has been repaired/replaced.  Aortic valve regurgitation is mild. Procedure Date: 08/12/2020. Aortic  valve area, by VTI measures 1.83 cm. Aortic valve mean gradient measures  7.0 mmHg. Aortic valve Vmax measures  1.82 m/s.   8. The inferior vena cava is dilated in size with <50% respiratory  variability, suggesting right atrial pressure of 15 mmHg.   RHC/LHC 11/3     Ost LM to LM lesion is 99% stenosed.   Mid LM to Prox LAD lesion is 95% stenosed.   Prox LAD to Mid LAD lesion is 40% stenosed.   Mid LAD lesion is 30% stenosed.   Ost Cx to Prox Cx lesion is 100% stenosed.   Dist RCA lesion is 95% stenosed.   Mid RCA lesion is 30% stenosed.   Ost 2nd Diag to 2nd Diag lesion is 40% stenosed.   2nd Mrg lesion is 50% stenosed.   Ost RPDA to RPDA lesion is 90% stenosed.   RPDA lesion is 99% stenosed.   RPAV lesion is 100% stenosed.   Ost RCA to Prox RCA lesion is 40% stenosed.   Previously placed Prox RCA stent of  unknown type is  widely patent.   Findings:   Ao = 108/43 (70) RA = 17 RV = 60/19 PA =  50/15 PCW = 13 Fick cardiac output/index = 6.0/3.6 Thermo CO/CI = 5.7/3.4 PVR = 3.0 WU Ao sat = 78% PA sat = 51%  PAPi = 2.4   ABG #1   7.38/76/46/78% on 6L ABG #2   7.37/73/44/76% on 6L    Sats at end of procedure 83-86%   Assessment: 1. Severe 3v CAD with proximally occluded LAD and ostially occluded LCx (unchanged) 2. RCA stent widely patent with new total occlusion of distal RCA. There are tandem high-grade lesions in distal RCA leading into moderate-sized PDA 3. LIMA not anastomosed to the heart 4. SVG to Diagonal widely patent with backfilling of LAD 5. SVG to OM widely patent 6. High-grade (>95%) stenosis of proximal left subclavian with evidence of steal 7. Moderate pulmonary  HTN with normal output and left-sided filling pressures 8. Severe (likely end-stage) chronic lung disease with chronic hypoxia and CO2 retention    Plan/Discussion:    She does have progressive distal disease in native RCA but doubt this is responsible for her symptoms. Would continue to treat CAD medically. Main issues are end-stage lung disease and high-grade proximal left subclavian stenosis.   Assessment & Plan  Emily Rocha is a 68 year old F known to have CAD [ NSTEMI in 2018 s/p CABG (LIMA to LAD, SVG to LCx, SVG to D1) c/w cardiogenic shock, s/p orbital atherectomy and RCA PCI in 2022] HFimpEF (LVEF 45 to 50% in 2018, improved to normal), severe aortic valve stenosis s/p TAVR in 2022, COPD on home O2, history of lung cancer s/p lobectomy, history of trace subdural hemorrhage in 2021, history of pulm embolism in 2021, and paroxysmal atrial fibrillation not on [facility] presented to the ED on 10/23 for progressive HF symptoms. On arrival noted to be in AF RVR. Cardiology consulted.   Acute on chronic diastolic heart failure with RV failure Patient has received IV diuresis this admission intermittently, hindered by hypotension Underwent thoracocentesis on 10/31. 600 cc drained.  RHC/LHC today for further evaluation: See above. Recommend Pulmonology consult as CAD and volume are not suspected to be drivers of patient's symptoms.   On exam does not appears to be volume over Wedge 13 Net IO Since Admission: -10,360.03 mL [12/19/23 1126] Weight - 7lb since admission   AF, with episode of RVR Chad Vasc score 8  Continue lopressor  12.5 mg  Restart coumadin  [ DOAC drug interaction with primidone], briefly on  heparin  2/2 RHC/LHC  CAD  - status post Defer antiplatelet with chronic anticoagulation  Hx of subdural hemorrhage, defer effient.   Continue metoprolol  as above  Severe AS s/p TAVR Continue outpatient surveillance  Hyperlipidemia Continue lipitor  40 mg   Proximal left subclavian stenosis  - this can be addressed in the outpatient setting.   Chronic advanced COPD - follows with pulmonary  Per primary COPD with chronic respiratory failure on home oxygen  Hyponatremia GERD Anxiety   For questions or updates, please contact [facility] Please consult [email or web address] for contact info under       Signed, Leontine LOISE Salen, PA-C

## 2023-12-19 NOTE — Progress Notes (Signed)
   Palliative Medicine Inpatient Follow Up Note HPI: 68 yo female with the past medical history of COPD, astham, coronary artery disease, peripheral vascular disease, and sp TAVR who presented with palpitations and dyspnea.Palliative care has been asked to support additional goals of care conversations in the setting of high chronic disease burden.   Today's Discussion 12/19/2023  *Please note that this is a verbal dictation therefore any spelling or grammatical errors are due to the Dragon Medical One system interpretation.  Chart reviewed inclusive of vital signs, progress notes of Dr. Cherrie, Dr. Sheena, Dr. Noralee, [facility] - PT, laboratory results, and diagnostic images. Cardiac catheterization completed this afternoon showing progressive distal disease in native RCA but doubt this is responsible for her symptoms. Would continue to treat CAD medically. Main issues are end-stage lung disease and high-grade proximal left subclavian stenosis.  I met with Emily Rocha this later afternoon. She is no NAD. She is generally sleepy from her cardiac catheterization. Created space and opportunity for patient to explore thoughts feelings and fears regarding current medical situation. Patient and I discussed allowing her time to rest and having more conversation(s) tomorrow.   Staff share no concerns at this time.   Per discussion with chaplain patient not desiring to complete AD's at this time.   Questions and concerns addressed/Palliative Support Provided.   Objective Assessment: Vital Signs Vitals:   12/19/23 1207 12/19/23 1224  BP:  (!) 130/56  Pulse: (!) 0 78  Resp:  16  Temp:  97.9 F (36.6 C)  SpO2:  95%    Intake/Output Summary (Last 24 hours) at 12/19/2023 1610 Last data filed at 12/19/2023 1300 Gross per 24 hour  Intake 363 ml  Output 2450 ml  Net -2087 ml   Last Weight  Most recent update: 12/19/2023  4:09 AM    Weight  63.6 kg (140 lb 3.4 oz)            Gen:  Elderly  Caucasian F chronically ill HEENT: moist mucous membranes CV: Regular rate and irregular rhythm  PULM: On 2LPM [place], breathing is nonlabored ABD: soft/nontender EXT: No edema Neuro: Alert and oriented x3  SUMMARY OF RECOMMENDATIONS   DNAR/DNI   Ongoing conversations related to advanced directives   Allowing time for outcomes   Ongoing palliative care support ______________________________________________________________________________________ Emily Rocha Palliative Medicine Team Team Cell Phone: [phone] Please utilize secure chat with additional questions, if there is no response within 30 minutes please call the above phone number  Billing based on MDM: Moderate  Palliative Medicine Team providers are available by phone from 7am to 7pm daily and can be reached through the team cell phone.  Should this patient require assistance outside of these hours, please call the patient's attending physician.

## 2023-12-19 NOTE — Interval H&P Note (Signed)
 History and Physical Interval Note:  12/19/2023 9:40 AM  Emily Rocha  has presented today for surgery, with the diagnosis of HF.  The various methods of treatment have been discussed with the patient and family. After consideration of risks, benefits and other options for treatment, the patient has consented to  Procedure(s): RIGHT/LEFT HEART CATH AND CORONARY/GRAFT ANGIOGRAPHY (N/A) as a surgical intervention.  The patient's history has been reviewed, patient examined, no change in status, stable for surgery.  I have reviewed the patient's chart and labs.  Questions were answered to the patient's satisfaction.     Jennae Hakeem

## 2023-12-19 NOTE — Plan of Care (Signed)
   Problem: Education: Goal: Knowledge of General Education information will improve Description Including pain rating scale, medication(s)/side effects and non-pharmacologic comfort measures Outcome: Progressing   Problem: Health Behavior/Discharge Planning: Goal: Ability to manage health-related needs will improve Outcome: Progressing

## 2023-12-19 NOTE — Progress Notes (Signed)
 This chaplain responded to PMT NP-Michele consult for assisting the Pt. and family in creating the Pt. Advance Directive.  The Pt. is sitting on the edge of the bed. Family is not at the bedside.  The Pt. declines AD education. Through communication with the Pt., the chaplain understands the Pt. accepts her spouse as surrogate decision maker. At the time of the visit, the chaplain questions whether the Pt. understands the difference between Durable POA and Healthcare POA. The Pt. consistently reverted back to conversations about property and money.  If needed, this chaplain will revisit at another time.   Chaplain Leeroy Hummer [phone]

## 2023-12-19 NOTE — Progress Notes (Signed)
 Progress Note   [name] FMW:[id number] DOB: 12-10-1955 DOA: 12/08/2023     10 DOS: the patient was seen and examined on 12/19/2023   Brief hospital course: Emily Rocha was admitted to the hospital with the working diagnosis of heart failure exacerbation  68 yo female with the past medical history of COPD, astham, coronary artery disease, peripheral vascular disease, and sp TAVR who presented with palpitations and dyspnea. No PND, orthopnea or lower extremity edema.  On her initial physical examination her blood pressure was 136/120, HR 102, RR 24 and 02 saturation 94%  Lungs with decreased breath sounds at bases and positive bilateral rales, heart with S1 and S2 present, irregularly irregular with no gallops, rubs or murmurs, abdomen with no distention and positive lower extremity edema.   Na 139, K 4,1 Cl 93 bicarbonate 40 glucose 95 bun 12 cr 0,54  BNP 2,590  High sensitive troponin 19 and 17   Chest radiograph with cardiomegaly, bilateral hilar vascular congestion, bilateral pleural effusions, more right than left. Sternotomy wires in place  EKG 138 bpm, normal axis, qtc 511, atrial fibrillation rhythm with no significant ST segment or T wave changes.   Patient was placed on IV furosemide  for diuresis.  Echocardiogram with RV failure and severe pulmonary hypertension.  Patient was placed on midodrine for blood pressure support.  10/31 right thoracentesis 600 cc removed.   Decision was made to transfer to [facility] for cardiac catheterization.  11/01 At [facility], plan for catheterization on 11/03  11/03 cardiac catheterization with elevated pulmonary pressures, consistent with pulmonary hypertension group 3.   Assessment and Plan: * Acute on chronic diastolic heart failure (HCC) Echocardiogram with preserved LV systolic function with EF 55 to 60%, mild LVH, interventricular septum flattened in systole and diastole, RV systolic function with moderate reduction, RVSP 39,8 mmHg, LA and  RA with severe dilatation, moderate tricuspid regurgitation, sp TAVR.   Cardiac catheterization . RA 17  RV 60/19 PA 50/15 PCWP 13 Cardiac output 6.0 and index 3.6 (fick) Cardiac output 5.7 and index 3.6 (thermodilution)  PVR 3.0 WU  Acute on chronic cor pulmonale Pulmonary hypertension, likely group 3 RV failure with poor prognosis.   Documented urine output is 2,050 ml Systolic blood pressure 100 to 120 mmHg range.   Patient had IV furosemide , likely transition to po loop diuretic in am.  Continue with SGLT 2 inh.  Continue metoprolol .  Limited RAAS inhibition due to risk of hypotension.  Continue midodrine for blood pressure support.   CAD (coronary artery disease)/Prior CABG No acute coronary syndrome.  Continue blood pressure monitoring   Atrial fibrillation with RVR (HCC) Continue rate control with metoprolol .  Has been on warfarin due to interaction with primidone.  Apparently her tremors have been not improved with primidone, she is ok in discontinue this and use apixaban  for anticoagulation.  Consult pharmacy for DOAC  Chronic obstructive pulmonary disease (COPD) (HCC) Continue bronchodilator therapy Continue inhaled corticosteroids.  No signs of acute exacerbation .  Advanced COPD, patient follows with pulmonary as outpatient.  Possible addition of Bipap at home.   Hyponatremia Today renal function with serum cr at 0,57 with K at 3,8 and serum bicarbonate at 40  Na 133 and Mg 2.4   Renal function today with serum cr at 0,71 with K at 4,7 and serum bicarbonate at 38  Na 131 and Mg 2.3  Plan to transition to po loop diuretic in am.    Dyslipidemia Continue with atorvastatin   GERD without esophagitis Continue pantoprazole .,   Anxiety Continue with alprazolam .         Subjective: Patient with no chest pain, edema has been improving, no chest pain, dyspnea at baseline,   Physical Exam: Vitals:   12/19/23 1157 12/19/23 1202 12/19/23 1207  12/19/23 1224  BP: (!) 114/50 (!) 114/50  (!) 130/56  Pulse: (!) 0 (!) 0 (!) 0 78  Resp:    16  Temp:    97.9 F (36.6 C)  TempSrc:    Oral  SpO2:    95%  Weight:      Height:       Neurology awake and alert ENT with mild pallor Cardiovascular with S1 and S2 present, irregularly irregular with no gallops, or rubs, positive systolic murmur a the left lower sternal border Positive JVD Respiratory with prolonged expiratory phase with scattered rhonchi and rales with no wheezing Abdomen with no distention  Positive lower extremity edema, + , ted hose in place   Data Reviewed:    Family Communication: I spoke with patient's brother and husband  at the bedside, we talked in detail about patient's condition, plan of care and prognosis and all questions were addressed.   Disposition: Status is: Inpatient Remains inpatient appropriate because: plan for discharge home tomorrow   Planned Discharge Destination: Home     Author: Elidia Toribio Furnace, MD 12/19/2023 4:25 PM  For on call review [email or web address].

## 2023-12-19 NOTE — Progress Notes (Signed)
 PHARMACY - ANTICOAGULATION CONSULT NOTE  Pharmacy Consult for warfarin>>heparin >>apixaban  Indication: atrial fibrillation  Allergies  Allergen Reactions   Levaquin  [Levofloxacin ] Swelling    Patient Measurements: Height: 5' 4 (162.6 cm) Weight: 63.6 kg (140 lb 3.4 oz) IBW/kg (Calculated) : 54.7 HEPARIN  DW (KG): 66.8  Vital Signs: Temp: 97.9 F (36.6 C) (11/03 1224) Temp Source: Oral (11/03 1224) BP: 130/56 (11/03 1224) Pulse Rate: 78 (11/03 1224)  Labs: Recent Labs    12/17/23 0230 12/17/23 0909 12/18/23 0143 12/18/23 1556 12/19/23 0224 12/19/23 1051 12/19/23 1058 12/19/23 1135 12/19/23 1158  HGB  --   --   --   --  10.2*   < > 11.2* 10.2* 10.5*  HCT  --   --   --   --  34.1*   < > 33.0* 30.0* 31.0*  PLT  --   --   --   --  186  --   --   --   --   LABPROT 26.0*  --  19.8*  --  17.4*  --   --   --   --   INR 2.3*  --  1.6*  --  1.4*  --   --   --   --   HEPARINUNFRC  --   --   --  0.19* 0.38  --   --   --   --   CREATININE 0.74 0.61 0.57  --  0.71  --   --   --   --    < > = values in this interval not displayed.    Estimated Creatinine Clearance: 56.5 mL/min (by C-G formula based on SCr of 0.71 mg/dL).   Medical History: Past Medical History:  Diagnosis Date   Anxiety    Asthma    COPD (chronic obstructive pulmonary disease) (HCC)    Coronary artery disease    a. s/p NSTEMI in 11/2016 and required CABG with LIMA-LAD, SVG-LCx and SVG-D1 and complicated by cardiogenic shock b. cath in 05/2020 showing severe RCA stenosis and treated with orbital atherectomy and stent placement   GERD (gastroesophageal reflux disease)    History of CVA (cerebrovascular accident)    History of lung cancer 2020   Peripheral vascular disease    S/P TAVR (transcatheter aortic valve replacement) 08/12/2020   s/p TAVR with a 23 mm Edwards S3U via the TF approach by Dr. Verlin & Dr. Lucas   Severe aortic stenosis     Assessment: Pharmacy consulted to dose warfarin in  patient with atrial fibrillation.  Patient is primidone prior to admission which is contraindicated with Eliquis - could consider Eliquis  in future if patient stops taking primidone.   Plans for right heart cath and pharmacy to start heparin  when INR < 2.0 -INR= 2.3> 1.4  Has been on warfarin due to interaction with primidone.  Per Dr. Noralee note, apparently her tremors have been not improved with primidone, she is ok to discontinue this and use apixaban  for anticoagulation. To start Eliquis  tonight.  Goal of Therapy:  Monitor platelets by anticoagulation protocol: Yes   Plan:  Apixaban  5mg  po BID Copay check submitted Will need education prior to d/c  Vito Ralph, PharmD, BCPS Please see amion for complete clinical pharmacist phone list 12/19/2023 4:38 PM

## 2023-12-19 NOTE — Progress Notes (Addendum)
 PHARMACY - ANTICOAGULATION CONSULT NOTE  Pharmacy Consult for warfarin>>heparin  Indication: atrial fibrillation  Allergies  Allergen Reactions   Levaquin  [Levofloxacin ] Swelling    Patient Measurements: Height: 5' 4 (162.6 cm) Weight: 63.6 kg (140 lb 3.4 oz) IBW/kg (Calculated) : 54.7 HEPARIN  DW (KG): 66.8  Vital Signs: Temp: 97.8 F (36.6 C) (11/03 0725) Temp Source: Oral (11/03 0725) BP: 116/53 (11/03 0725) Pulse Rate: 75 (11/03 0725)  Labs: Recent Labs    12/17/23 0230 12/17/23 0909 12/18/23 0143 12/18/23 1556 12/19/23 0224  HGB  --   --   --   --  10.2*  HCT  --   --   --   --  34.1*  PLT  --   --   --   --  186  LABPROT 26.0*  --  19.8*  --  17.4*  INR 2.3*  --  1.6*  --  1.4*  HEPARINUNFRC  --   --   --  0.19* 0.38  CREATININE  --  0.61 0.57  --  0.71    Estimated Creatinine Clearance: 56.5 mL/min (by C-G formula based on SCr of 0.71 mg/dL).   Medical History: Past Medical History:  Diagnosis Date   Anxiety    Asthma    COPD (chronic obstructive pulmonary disease) (HCC)    Coronary artery disease    a. s/p NSTEMI in 11/2016 and required CABG with LIMA-LAD, SVG-LCx and SVG-D1 and complicated by cardiogenic shock b. cath in 05/2020 showing severe RCA stenosis and treated with orbital atherectomy and stent placement   GERD (gastroesophageal reflux disease)    History of CVA (cerebrovascular accident)    History of lung cancer 2020   Peripheral vascular disease    S/P TAVR (transcatheter aortic valve replacement) 08/12/2020   s/p TAVR with a 23 mm Edwards S3U via the TF approach by Dr. Verlin & Dr. Lucas   Severe aortic stenosis     Assessment: Pharmacy consulted to dose warfarin in patient with atrial fibrillation.  Patient is primidone prior to admission which is contraindicated with Eliquis - could consider Eliquis  in future if patient stops taking primidone.    Plans for right heart cath and pharmacy to start heparin  when INR < 2.0 -INR= 2.3>  1.4  Heparin  level therapeutic this AM. Plan for R/L cath today.   Addendum  S/p cath. Ok to resume heparin /coumadin  2 hrs post TR band removal per Dr. Cherrie.  Goal of Therapy:  INR 2-3 Heparin  Level: 0.3-0.7 Monitor platelets by anticoagulation protocol: Yes   Plan:  Resume heparin  infusion 1050 units/hr. 8 hr HL Coumadin  5mg  PO x1 Heparin  level with AM labs and daily wth CBC daily Daily PT/INR  Sergio Batch, PharmD, BCIDP, AAHIVP, CPP Infectious Disease Pharmacist 12/19/2023 8:08 AM

## 2023-12-19 NOTE — Plan of Care (Signed)
   Problem: Education: Goal: Knowledge of General Education information will improve Description Including pain rating scale, medication(s)/side effects and non-pharmacologic comfort measures Outcome: Progressing   Problem: Activity: Goal: Risk for activity intolerance will decrease Outcome: Progressing   Problem: Safety: Goal: Ability to remain free from injury will improve Outcome: Progressing

## 2023-12-19 NOTE — Progress Notes (Signed)
 Mobility Specialist Progress Note:    12/19/23 1430  Mobility  Activity Ambulated with assistance  Level of Assistance Contact guard assist, steadying assist  Assistive Device Front wheel walker  Distance Ambulated (ft) 400 ft  Activity Response Tolerated fair  Mobility Referral Yes  Mobility visit 1 Mobility  Mobility Specialist Start Time (ACUTE ONLY) 1430  Mobility Specialist Stop Time (ACUTE ONLY) 1451  Mobility Specialist Time Calculation (min) (ACUTE ONLY) 21 min   Received pt sitting EOB agreeable to session. No c/o any symptoms but noticed pt breathing sounded labored. Pt stated the she felt great the entire time. Pt moving and ambulating decently. Returned pt to EOB w/ all needs met.   Venetia Keel Mobility Specialist Please Neurosurgeon or Rehab Office at [phone]

## 2023-12-19 NOTE — Progress Notes (Signed)
 PT Cancellation Note  Patient Details Name: ZULEYMA SCHARF MRN: [id number] DOB: 07/07/1955   Cancelled Treatment:    Reason Eval/Treat Not Completed: Patient at procedure or test/unavailable. Pt at heart cath. Will follow up tomorrow.   Rodgers ORN [facility] 12/19/2023, 10:11 AM Rodgers Opal PT Acute [employer] [phone]

## 2023-12-20 ENCOUNTER — Other Ambulatory Visit (HOSPITAL_COMMUNITY): Payer: Self-pay

## 2023-12-20 ENCOUNTER — Encounter (HOSPITAL_COMMUNITY): Payer: Self-pay | Admitting: Internal Medicine

## 2023-12-20 ENCOUNTER — Telehealth (HOSPITAL_COMMUNITY): Payer: Self-pay | Admitting: Pharmacy Technician

## 2023-12-20 DIAGNOSIS — J439 Emphysema, unspecified: Secondary | ICD-10-CM | POA: Diagnosis not present

## 2023-12-20 DIAGNOSIS — I2510 Atherosclerotic heart disease of native coronary artery without angina pectoris: Secondary | ICD-10-CM | POA: Diagnosis not present

## 2023-12-20 DIAGNOSIS — I5033 Acute on chronic diastolic (congestive) heart failure: Secondary | ICD-10-CM | POA: Diagnosis not present

## 2023-12-20 DIAGNOSIS — I4891 Unspecified atrial fibrillation: Secondary | ICD-10-CM | POA: Diagnosis not present

## 2023-12-20 DIAGNOSIS — Z515 Encounter for palliative care: Secondary | ICD-10-CM | POA: Diagnosis not present

## 2023-12-20 DIAGNOSIS — Z7189 Other specified counseling: Secondary | ICD-10-CM | POA: Diagnosis not present

## 2023-12-20 LAB — CBC
HCT: 32.1 % — ABNORMAL LOW (ref 36.0–46.0)
Hemoglobin: 9.7 g/dL — ABNORMAL LOW (ref 12.0–15.0)
MCH: 28.6 pg (ref 26.0–34.0)
MCHC: 30.2 g/dL (ref 30.0–36.0)
MCV: 94.7 fL (ref 80.0–100.0)
Platelets: 177 K/uL (ref 150–400)
RBC: 3.39 MIL/uL — ABNORMAL LOW (ref 3.87–5.11)
RDW: 15.1 % (ref 11.5–15.5)
WBC: 5.4 K/uL (ref 4.0–10.5)
nRBC: 0 % (ref 0.0–0.2)

## 2023-12-20 LAB — BASIC METABOLIC PANEL WITH GFR
Anion gap: 13 (ref 5–15)
BUN: 12 mg/dL (ref 8–23)
CO2: 35 mmol/L — ABNORMAL HIGH (ref 22–32)
Calcium: 8.5 mg/dL — ABNORMAL LOW (ref 8.9–10.3)
Chloride: 82 mmol/L — ABNORMAL LOW (ref 98–111)
Creatinine, Ser: 0.61 mg/dL (ref 0.44–1.00)
GFR, Estimated: >60 mL/min (ref >60)
Glucose, Bld: 104 mg/dL — ABNORMAL HIGH (ref 70–99)
Potassium: 4.4 mmol/L (ref 3.5–5.1)
Sodium: 130 mmol/L — ABNORMAL LOW (ref 135–145)

## 2023-12-20 LAB — PATHOLOGIST SMEAR REVIEW: Path Review: REACTIVE

## 2023-12-20 MED ORDER — MIDODRINE HCL 5 MG PO TABS
5.0000 mg | ORAL_TABLET | Freq: Three times a day (TID) | ORAL | 0 refills | Status: DC
  Filled 2023-12-20: qty 90, 30d supply, fill #0

## 2023-12-20 MED ORDER — APIXABAN 5 MG PO TABS
5.0000 mg | ORAL_TABLET | Freq: Two times a day (BID) | ORAL | 0 refills | Status: DC
  Filled 2023-12-20: qty 60, 30d supply, fill #0

## 2023-12-20 MED ORDER — TORSEMIDE 20 MG PO TABS
40.0000 mg | ORAL_TABLET | Freq: Every day | ORAL | Status: DC
  Administered 2023-12-20: 40 mg via ORAL
  Filled 2023-12-20: qty 2

## 2023-12-20 MED ORDER — POTASSIUM CHLORIDE CRYS ER 20 MEQ PO TBCR
20.0000 meq | EXTENDED_RELEASE_TABLET | Freq: Every morning | ORAL | 0 refills | Status: DC
  Filled 2023-12-20: qty 30, 30d supply, fill #0

## 2023-12-20 MED ORDER — METOPROLOL SUCCINATE ER 25 MG PO TB24: 25.0000 mg | ORAL_TABLET | Freq: Every day | ORAL | Status: DC

## 2023-12-20 MED ORDER — TORSEMIDE 20 MG PO TABS
40.0000 mg | ORAL_TABLET | Freq: Every day | ORAL | 0 refills | Status: DC
  Filled 2023-12-20: qty 60, 30d supply, fill #0

## 2023-12-20 MED ORDER — METOPROLOL SUCCINATE ER 25 MG PO TB24
25.0000 mg | ORAL_TABLET | Freq: Every day | ORAL | 0 refills | Status: DC
  Filled 2023-12-20: qty 30, 30d supply, fill #0

## 2023-12-20 NOTE — TOC Transition Note (Addendum)
 Transition of Care [facility]) - Discharge Note   Patient Details  Name: Emily Rocha MRN: [id number] Date of Birth: 1955-09-04  Transition of Care [facility]) CM/SW Contact:  Waddell Barnie Rama, RN Phone Number: 12/20/2023, 10:38 AM   Clinical Narrative:    For dc today, NCM notified Cory with [employer] and Mitch with [employer], per Mitch with [employer] the respiratory team will be in touch with the patient so they can set up her NIV at home. Co pay for eliquis  is zero dollars.    Final next level of care: Home w Home Health Services Barriers to Discharge: Continued Medical Work up   Patient Goals and CMS Choice Patient states their goals for this hospitalization and ongoing recovery are:: return home CMS Medicare.gov Compare Post Acute Care list provided to:: Patient Choice offered to / list presented to : Patient      Discharge Placement                       Discharge Plan and Services Additional resources added to the After Visit Summary for   In-house Referral: Clinical Social Work Discharge Planning Services: CM Consult Post Acute Care Choice: Home Health                    HH Arranged: PT [facility] Agency: [facility] Date [facility] Agency Contacted: 12/14/23   Representative spoke with at [facility] Agency: Darleene  Social Drivers of Health (SDOH) Interventions SDOH Screenings   Food Insecurity: No Food Insecurity (12/09/2023)  Housing: Low Risk  (12/09/2023)  Transportation Needs: No Transportation Needs (12/09/2023)  Utilities: Not At Risk (12/09/2023)  Depression (PHQ2-9): Low Risk  (10/28/2020)  Social Connections: Socially Integrated (12/09/2023)  Tobacco Use: Medium Risk (12/16/2023)     Readmission Risk Interventions    12/09/2023   11:48 AM  Readmission Risk Prevention Plan  Medication Screening Complete  Transportation Screening Complete

## 2023-12-20 NOTE — Progress Notes (Signed)
 Heart Failure Navigator Progress Note  Assessed for Heart & Vascular TOC clinic readiness.  Patient does not meet criteria due to has a scheduled [facility] appointment on 01/10/24, No HF TOC per Dr. Arrien. .   Navigator will sign off at this time.  Stephane Haddock, BSN, Scientist, Clinical (histocompatibility And Immunogenetics) Only

## 2023-12-20 NOTE — Plan of Care (Signed)
  Problem: Education: Goal: Knowledge of General Education information will improve Description: Including pain rating scale, medication(s)/side effects and non-pharmacologic comfort measures Outcome: Progressing   Problem: Activity: Goal: Risk for activity intolerance will decrease Outcome: Progressing   Problem: Activity: Goal: Risk for activity intolerance will decrease Outcome: Progressing   Problem: Coping: Goal: Level of anxiety will decrease Outcome: Progressing

## 2023-12-20 NOTE — Care Management Important Message (Signed)
 Important Message  Patient Details  Name: ROSINA CRESSLER MRN: [id number] Date of Birth: 07-04-1955   Important Message Given:  Yes - Medicare IM     Vonzell Arrie Sharps 12/20/2023, 12:40 PM

## 2023-12-20 NOTE — Telephone Encounter (Signed)
 Patient Product/process Development Scientist completed.    The patient is insured through [employer]/ [employer]. Patient has Medicare and is not eligible for a copay card, but may be able to apply for patient assistance or Medicare RX Payment Plan (Patient Must reach out to their plan, if eligible for payment plan), if available.    Ran test claim for Eliquis  5 mg and the current 30 day co-pay is $0.00.   This test claim was processed through  [facility]- copay amounts may vary at other pharmacies due to pharmacy/plan contracts, or as the patient moves through the different stages of their insurance plan.     Reyes Sharps, CPHT Pharmacy Technician Patient Advocate Specialist Lead [facility] Patient Advocate Team Direct Number: [phone]  Fax: [phone]

## 2023-12-20 NOTE — Discharge Summary (Signed)
 Physician Discharge Summary   [name] MRN: [id number] DOB: Aug 10, 1955  Admit date:     12/08/2023  Discharge date: 12/20/23  Discharge Physician: Elidia Sieving Aamya Orellana   PCP: Rosamond Leta NOVAK, MD   Recommendations at discharge:    Metoprolol  succinate increased to 25 mg daily, added midodrine for blood pressure support, limited guideline directed medical therapy due to risk of hypotension. Continue diuresis with torsemide  40 mg daily along with Kcl supplements.  Transition to apixaban  for anticoagulation  Follow up renal function and electrolytes in 7 days as outptient Follow up with Dr Rosamond in 7 to 10 days as outpatient  Follow up with Cardiology and Pulmonary as scheduled.   Discharge Diagnoses: Principal Problem:   Acute on chronic diastolic heart failure (HCC) Active Problems:   CAD (coronary artery disease)/Prior CABG   Atrial fibrillation with RVR (HCC)   Chronic obstructive pulmonary disease (COPD) (HCC)   Hyponatremia   Dyslipidemia   GERD without esophagitis   Anxiety   Pulmonary hypertension, unspecified (HCC)   RVF (right ventricular failure) (HCC)   History of transcatheter aortic valve replacement (TAVR)   Acute on chronic congestive heart failure (HCC)   History of pulmonary embolism   Right ventricular failure (HCC)   Atrial fibrillation (HCC)  Resolved Problems:   * No resolved hospital problems. [facility] Course: Emily Rocha was admitted to the hospital with the working diagnosis of heart failure exacerbation, in the setting of pulmonary hypertension.   68 yo female with the past medical history of COPD, astham, coronary artery disease, peripheral vascular disease, and sp TAVR who presented with palpitations and dyspnea. No PND, orthopnea or lower extremity but positive lower extremity edema  On her initial physical examination her blood pressure was 136/120, HR 102, RR 24 and 02 saturation 94%  Lungs with decreased breath sounds at bases and  positive bilateral rales, heart with S1 and S2 present, irregularly irregular with no gallops, rubs or murmurs, abdomen with no distention and positive lower extremity edema.   Na 139, K 4,1 Cl 93 bicarbonate 40 glucose 95 bun 12 cr 0,54  BNP 2,590  High sensitive troponin 19 and 17   Chest radiograph with cardiomegaly, bilateral hilar vascular congestion, bilateral pleural effusions, more right than left. Sternotomy wires in place  EKG 138 bpm, normal axis, qtc 511, atrial fibrillation rhythm with no significant ST segment or T wave changes.   Patient was placed on IV furosemide  for diuresis.  Echocardiogram with RV failure and severe pulmonary hypertension.  Patient was placed on midodrine for blood pressure support.  10/31 right thoracentesis 600 cc removed.   Decision was made to transfer to [facility] for cardiac catheterization.  11/01 At [facility], plan for catheterization on 11/03  11/03 cardiac catheterization with elevated pulmonary pressures, consistent with pulmonary hypertension group 3.  11/04 clinically with improved volume status, plan to continue medical therapy at home and follow up with pulmonary as outpatient   Assessment and Plan: * Acute on chronic diastolic heart failure (HCC) Echocardiogram with preserved LV systolic function with EF 55 to 60%, mild LVH, interventricular septum flattened in systole and diastole, RV systolic function with moderate reduction, RVSP 39,8 mmHg, LA and RA with severe dilatation, moderate tricuspid regurgitation, sp TAVR.   11/03 Cardiac catheterization . RA 17  RV 60/19 PA 50/15 PCWP 13 Cardiac output 6.0 and index 3.6 (fick) Cardiac output 5.7 and index 3.6 (thermodilution)  PVR 3.0 WU  Acute on chronic cor pulmonale  Pulmonary hypertension, likely group 3 RV failure with poor prognosis.   Patient she was placed on IV furosemide , negative fluid was achieved, - 10,760 ml, with significant improvement in her symptoms.   Acute on chronic  hypoxemic respiratory failure due to cardiogenic pulmonary edema. Symptoms have improved, with diuresis, at the time of her discharge her 02 saturation is 92% on supplemental 02 per [place] Continue home supplemental 02 per [place]   Plan to continue with SGLT 2 inh and metoprolol .  Limited RAAS inhibition due to risk of hypotension.  Continue midodrine for blood pressure support.  Diuresis with loop diuretic.  CAD (coronary artery disease)/Prior CABG No acute coronary syndrome.  Continue blood pressure monitoring   Atrial fibrillation with RVR (HCC) Continue rate control with metoprolol .  Has been on warfarin due to interaction with primidone.  Apparently her tremors have been not improved with primidone, she is ok in discontinue this and use apixaban  for anticoagulation.   Patient will continue anticoagulation with apixaban .   Chronic obstructive pulmonary disease (COPD) (HCC) Continue bronchodilator therapy Continue inhaled corticosteroids.  No signs of acute exacerbation .  Advanced COPD, patient follows with pulmonary as outpatient.  Possible addition of Bipap at home.  Continue supplemental 02 per [place] to keep 02 saturation 88% or greater.   Hyponatremia At the time of her discharge her renal function had a serum cr of 0,61 with K at 4.4 and serum bicarbonate at 35 Na 130   Continue diuresis with loop diuretic and SGLT 2 inh. Follow up renal function and electrolytes as outpatient in 7 days   Dyslipidemia Continue with atorvastatin    GERD without esophagitis Continue pantoprazole .,   Anxiety Continue with alprazolam .          Consultants: cardiology  Procedures performed: cardiac catheterization  Disposition: Home Diet recommendation:  Cardiac diet DISCHARGE MEDICATION: Allergies as of 12/20/2023       Reactions   Levaquin  [levofloxacin ] Swelling        Medication List     STOP taking these medications    aspirin  EC 81 MG tablet   budesonide  0.25 MG/2ML  nebulizer solution Commonly known as: Pulmicort    ibuprofen 200 MG tablet Commonly known as: ADVIL   primidone 50 MG tablet Commonly known as: MYSOLINE       TAKE these medications    albuterol  108 (90 Base) MCG/ACT inhaler Commonly known as: VENTOLIN  HFA Inhale 1-2 puffs into the lungs every 6 (six) hours as needed for wheezing or shortness of breath.   albuterol  (2.5 MG/3ML) 0.083% nebulizer solution Commonly known as: PROVENTIL  Take 2.5 mg by nebulization every 4 (four) hours as needed for wheezing or shortness of breath.   ALPRAZolam  0.5 MG tablet Commonly known as: XANAX  Take 0.5 mg by mouth 3 (three) times daily.   apixaban  5 MG Tabs tablet Commonly known as: ELIQUIS  Take 1 tablet (5 mg total) by mouth 2 (two) times daily.   atorvastatin  40 MG tablet Commonly known as: Lipitor  Take 1 tablet (40 mg total) daily by mouth.   azithromycin  250 MG tablet Commonly known as: ZITHROMAX  Take 1 tablet (250 mg total) by mouth 3 (three) times a week.   diphenhydramine-acetaminophen  25-500 MG Tabs tablet Commonly known as: TYLENOL  PM Take 2 tablets by mouth at bedtime.   ipratropium-albuterol  0.5-2.5 (3) MG/3ML Soln Commonly known as: DUONEB One in am and one at supper time   magnesium  oxide 400 (240 Mg) MG tablet Commonly known as: MAG-OX Take 400 mg by mouth  daily.   metoprolol  succinate 25 MG 24 hr tablet Commonly known as: TOPROL -XL Take 1 tablet (25 mg total) by mouth at bedtime. What changed:  how much to take when to take this   midodrine 5 MG tablet Commonly known as: PROAMATINE Take 1 tablet (5 mg total) by mouth 3 (three) times daily with meals.   nystatin 100000 UNIT/ML suspension Commonly known as: MYCOSTATIN Take 5 mLs by mouth 4 (four) times daily.   pantoprazole  40 MG tablet Commonly known as: PROTONIX  Take 40 mg by mouth daily.   potassium chloride  SA 20 MEQ tablet Commonly known as: KLOR-CON  M Take 1 tablet (20 mEq total) by mouth in the  morning.   torsemide  20 MG tablet Commonly known as: DEMADEX  Take 2 tablets (40 mg total) by mouth daily. What changed:  when to take this reasons to take this   Trelegy Ellipta 100-62.5-25 MCG/ACT Aepb Generic drug: Fluticasone-Umeclidin-Vilant Inhale 1 Inhalation into the lungs daily.   VITAMIN C GUMMIE PO Take 400 mg by mouth daily.        Follow-up Information     [facility] Follow up.   Specialty: Home Health Services Why: Agency will call to set up first home therapy visit. Contact information: [address] [place] [address] [phone]                Discharge Exam: Filed Weights   12/18/23 0455 12/19/23 0406 12/20/23 0546  Weight: 62.5 kg 63.6 kg 62.1 kg   BP 130/65   Pulse 87   Temp 98.1 F (36.7 C) (Oral)   Resp 20   Ht 5' 4 (1.626 m)   Wt 62.1 kg   SpO2 94%   BMI 23.50 kg/m   Neurology awake and alert ENT with mild pallor with no icterus Cardiovascular with S1 and S2 present irregularly irregular with no no gallops or rubs, positive systolic murmur at the left lower sternal border Positive JVD Respiratory with prolonged expiratory phase with scattered rhonchi and rales with no wheezing Abdomen with no distention  Trace lower extremity edema, ted hose in place   Condition at discharge: stable  The results of significant diagnostics from this hospitalization (including imaging, microbiology, ancillary and laboratory) are listed below for reference.   Imaging Studies: CARDIAC CATHETERIZATION Result Date: 12/19/2023   Ost LM to LM lesion is 99% stenosed.   Mid LM to Prox LAD lesion is 95% stenosed.   Prox LAD to Mid LAD lesion is 40% stenosed.   Mid LAD lesion is 30% stenosed.   Ost Cx to Prox Cx lesion is 100% stenosed.   Dist RCA lesion is 95% stenosed.   Mid RCA lesion is 30% stenosed.   Ost 2nd Diag to 2nd Diag lesion is 40% stenosed.   2nd Mrg lesion is 50% stenosed.   Ost RPDA to RPDA lesion is 90%  stenosed.   RPDA lesion is 99% stenosed.   RPAV lesion is 100% stenosed.   Ost RCA to Prox RCA lesion is 40% stenosed.   Previously placed Prox RCA stent of unknown type is  widely patent. Findings: Ao = 108/43 (70) RA = 17 RV = 60/19 PA =  50/15 PCW = 13 Fick cardiac output/index = 6.0/3.6 Thermo CO/CI = 5.7/3.4 PVR = 3.0 WU Ao sat = 78% PA sat = 51% PAPi = 2.4 ABG #1   7.38/76/46/78% on 6L ABG #2   7.37/73/44/76% on 6L Sats at end of procedure 83-86% Assessment:  1. Severe 3v CAD with proximally occluded LAD and ostially occluded LCx (unchanged) 2. RCA stent widely patent with new total occlusion of distal RCA. There are tandem high-grade lesions in distal RCA leading into moderate-sized PDA 3. LIMA not anastomosed to the heart 4. SVG to Diagonal widely patent with backfilling of LAD 5. SVG to OM widely patent 6. High-grade (>95%) stenosis of proximal left subclavian with evidence of steal 7. Moderate pulmonary HTN with normal output and left-sided filling pressures 8. Severe (likely end-stage) chronic lung disease with chronic hypoxia and CO2 retention Plan/Discussion: She does have progressive distal disease in native RCA but doubt this is responsible for her symptoms. Would continue to treat CAD medically. Main issues are end-stage lung disease and high-grade proximal left subclavian stenosis. Would consult Pulmonary. Toribio Fuel, MD 12:05 PM   US  THORACENTESIS ASP PLEURAL SPACE W/IMG GUIDE Result Date: 12/16/2023 EXAM: ULTRASOUND GUIDED RIGHT THORACENTESIS 12/16/2023 01:56:48 PM TECHNIQUE: Informed consent was obtained after a detailed explanation of the procedure including risks, benefits, and alternatives. Universal protocol was performed. The right chest was prepped and draped in sterile fashion and local anesthesia was achieved with lidocaine . An thoracentesis needle sheath was advanced under ultrasound guidance into pleural effusion and thoracentesis was performed. The patient tolerated the  procedure well. COMPARISON: None provided. CLINICAL HISTORY: Loculated pleural effusion, shortness of breath FINDINGS: A total of 600 cc was removed. IMPRESSION: 1. Successful ultrasound guided right thoracentesis. Electronically signed by: Ryan Salvage MD 12/16/2023 02:40 PM EDT RP Workstation: [id number]   DG Chest 1 View Result Date: 12/16/2023 EXAM: 1 VIEW XRAY OF THE CHEST 12/16/2023 01:58:00 PM COMPARISON: 12/08/2023 CLINICAL HISTORY: SOB (shortness of breath) FINDINGS: LUNGS AND PLEURA: Decreased trace right pleural effusion. Unchanged interstitial markings. Right partial pneumonectomy postsurgical changes. No focal pulmonary opacity. No pulmonary edema. No pneumothorax. HEART AND MEDIASTINUM: Cardiomegaly. Prosthetic aortic valve noted. Aortic calcification. BONES AND SOFT TISSUES: Sternotomy wires and surgical clips noted. No acute osseous abnormality. IMPRESSION: 1. No acute cardiopulmonary findings. 2. Cardiomegaly with prosthetic aortic valve. 3. Decreased trace right pleural effusion. 4. Stable interstitial markings. Electronically signed by: Norleen Boxer MD 12/16/2023 02:35 PM EDT RP Workstation: [id number]   US  CHEST (PLEURAL EFFUSION) Result Date: 12/15/2023 CLINICAL DATA:  BILATERAL pleural effusion.  Shortness of breath. EXAM: CHEST ULTRASOUND COMPARISON:  CT angiography chest 12/14/2023 FINDINGS: Ultrasound evaluation of the chest demonstrates small LEFT and moderate RIGHT pleural effusions. IMPRESSION: Moderate RIGHT and small LEFT pleural effusions. Electronically Signed   By: Aliene Lloyd M.D.   On: 12/15/2023 15:17   CT Angio Chest Pulmonary Embolism (PE) W or WO Contrast Result Date: 12/14/2023 EXAM: CTA of the Chest with contrast for PE 12/14/2023 03:40:41 PM TECHNIQUE: CTA of the chest was performed after the administration of intravenous contrast. Multiplanar reformatted images are provided for review. MIP images are provided for review. Automated exposure control,  iterative reconstruction, and/or weight based adjustment of the mA/kV was utilized to reduce the radiation dose to as low as reasonably achievable. COMPARISON: None available. CLINICAL HISTORY: Pulmonary embolism (PE) suspected, low to intermediate probability, negative D-dimer, shortness of breath, lower extremity edema. FINDINGS: PULMONARY ARTERIES: Pulmonary arteries are adequately opacified for evaluation. No filling defect observed in the pulmonary arterial tree to suggest pulmonary embolus. Main pulmonary artery is normal in caliber. MEDIASTINUM: Prior TAVR. Cardiomegaly noted. Prior CABG. Thoracic aortic, coronary artery, and branch vessel atheromatous vascular calcifications. There is no acute abnormality of the thoracic aorta. LYMPH NODES: No mediastinal, hilar or axillary lymphadenopathy.  LUNGS AND PLEURA: Prior right upper lobectomy. Wedge resection in the right middle lobe. Emphysema. Partially calcified left upper lobe anterior subpleural nodules for example on image 53 series 12, unchanged. No focal consolidation or pulmonary edema. Small left pleural effusion with passive atelectasis. Moderate loculated right pleural effusion especially along the right hemidiaphragm. No pneumothorax. UPPER ABDOMEN: Perisplenic ascites. Small to moderate sized hiatal hernia. SOFT TISSUES AND BONES: No acute bone or soft tissue abnormality. IMPRESSION: 1. No pulmonary embolism. 2. Moderate loculated right pleural effusion and small left pleural effusion with passive atelectasis. 3. Emphysema and atherosclerosis.  Prior TAVR and CABG. 4. Cardiomegaly. Electronically signed by: Ryan Salvage MD 12/14/2023 04:20 PM EDT RP Workstation: [id number]   ECHOCARDIOGRAM COMPLETE Result Date: 12/11/2023    ECHOCARDIOGRAM REPORT   Patient Name:   Emily Rocha Date of Exam: 12/10/2023 Medical Rec #:  [id number]    Height:       64.0 in Accession #:    [id number]   Weight:       140.7 lb Date of Birth:  1955-09-20    BSA:           1.685 m Patient Age:    68 years     BP:           90/64 mmHg Patient Gender: F            HR:           71 bpm. Exam Location:  [facility] Procedure: 2D Echo, 3D Echo, Cardiac Doppler and Color Doppler (Both Spectral            and Color Flow Doppler were utilized during procedure). Indications:    Atrial Fibrillation I48.91  History:        Patient has prior history of Echocardiogram examinations, most                 recent 03/30/2022. CHF, CAD and Previous Myocardial Infarction,                 Prior CABG, COPD, Arrythmias:Atrial Fibrillation; Risk                 Factors:Current Smoker and Dyslipidemia. Aortic Valve: 23 mm                 Edwards Ultra, stented (TAVR) valve is present in the aortic                 position. Procedure Date: 08/12/20.  Sonographer:    Aida Pizza RCS Referring Phys: [id number] VISHNU P MALLIPEDDI  Sonographer Comments: IMPRESSIONS  1. Left ventricular ejection fraction, by estimation, is 55 to 60%. The left ventricle has normal function. The left ventricle has no regional wall motion abnormalities. There is mild asymmetric left ventricular hypertrophy. Left ventricular diastolic function could not be evaluated. There is the interventricular septum is flattened in systole and diastole, consistent with right ventricular pressure and volume overload.  2. Right ventricular systolic function is moderately reduced. The right ventricular size is moderately enlarged. There is mildly elevated pulmonary artery systolic pressure. The estimated right ventricular systolic pressure is 39.8 mmHg.  3. Left atrial size was severely dilated.  4. Right atrial size was severely dilated.  5. The mitral valve is degenerative. Mild mitral valve regurgitation. Moderate to severe mitral annular calcification.  6. Tricuspid valve regurgitation is moderate.  7. Mild paravalvular leak. The aortic valve has been repaired/replaced. Aortic valve regurgitation is mild. Procedure Date: 08/12/2020. Aortic valve  area, by VTI measures 1.83 cm. Aortic valve mean gradient measures 7.0 mmHg. Aortic valve Vmax measures 1.82 m/s.  8. The inferior vena cava is dilated in size with <50% respiratory variability, suggesting right atrial pressure of 15 mmHg. Comparison(s): No significant change from prior study. FINDINGS  Left Ventricle: Left ventricular ejection fraction, by estimation, is 55 to 60%. The left ventricle has normal function. The left ventricle has no regional wall motion abnormalities. 3D ejection fraction reviewed and evaluated as part of the interpretation. Alternate measurement of EF is felt to be most reflective of LV function. The left ventricular internal cavity size was normal in size. There is mild asymmetric left ventricular hypertrophy. The interventricular septum is flattened in systole and diastole, consistent with right ventricular pressure and volume overload. Left ventricular diastolic function could not be evaluated due to atrial fibrillation. Left ventricular diastolic function could not be evaluated. Right Ventricle: The right ventricular size is moderately enlarged. No increase in right ventricular wall thickness. Right ventricular systolic function is moderately reduced. There is mildly elevated pulmonary artery systolic pressure. The tricuspid regurgitant velocity is 2.49 m/s, and with an assumed right atrial pressure of 15 mmHg, the estimated right ventricular systolic pressure is 39.8 mmHg. Left Atrium: Left atrial size was severely dilated. Right Atrium: Right atrial size was severely dilated. Pericardium: There is no evidence of pericardial effusion. Mitral Valve: The mitral valve is degenerative in appearance. Moderate to severe mitral annular calcification. Mild mitral valve regurgitation. Tricuspid Valve: The tricuspid valve is grossly normal. Tricuspid valve regurgitation is moderate . No evidence of tricuspid stenosis. Aortic Valve: Mild paravalvular leak. The aortic valve has been  repaired/replaced. Aortic valve regurgitation is mild. Aortic regurgitation PHT measures 382 msec. Aortic valve mean gradient measures 7.0 mmHg. Aortic valve peak gradient measures 13.3 mmHg. Aortic valve area, by VTI measures 1.83 cm. There is a 23 mm Sapien prosthetic, stented (TAVR) valve present in the aortic position. Pulmonic Valve: The pulmonic valve was grossly normal. Pulmonic valve regurgitation is trivial. No evidence of pulmonic stenosis. Aorta: The aortic root is normal in size and structure. Venous: The inferior vena cava is dilated in size with less than 50% respiratory variability, suggesting right atrial pressure of 15 mmHg. IAS/Shunts: The atrial septum is grossly normal. Additional Comments: 3D was performed not requiring image post processing on an independent workstation and was indeterminate.  LEFT VENTRICLE PLAX 2D LVIDd:         4.70 cm   Diastology LVIDs:         3.40 cm   LV e' medial:    4.35 cm/s LV PW:         1.00 cm   LV E/e' medial:  36.3 LV IVS:        1.20 cm   LV e' lateral:   12.90 cm/s LVOT diam:     1.90 cm   LV E/e' lateral: 12.2 LV SV:         57 LV SV Index:   34 LVOT Area:     2.84 cm                           3D Volume EF:                          3D EF:        52 %  LV EDV:       116 ml                          LV ESV:       55 ml                          LV SV:        60 ml RIGHT VENTRICLE RV S prime:     7.83 cm/s TAPSE (M-mode): 1.7 cm LEFT ATRIUM             Index        RIGHT ATRIUM           Index LA diam:        4.30 cm 2.55 cm/m   RA Area:     27.40 cm LA Vol (A2C):   83.4 ml 49.51 ml/m  RA Volume:   94.20 ml  55.92 ml/m LA Vol (A4C):   80.2 ml 47.61 ml/m LA Biplane Vol: 82.7 ml 49.09 ml/m  AORTIC VALVE AV Area (Vmax):    1.28 cm AV Area (Vmean):   1.48 cm AV Area (VTI):     1.83 cm AV Vmax:           182.33 cm/s AV Vmean:          116.367 cm/s AV VTI:            0.311 m AV Peak Grad:      13.3 mmHg AV Mean Grad:      7.0 mmHg  LVOT Vmax:         82.47 cm/s LVOT Vmean:        60.567 cm/s LVOT VTI:          0.201 m LVOT/AV VTI ratio: 0.64 AI PHT:            382 msec  AORTA Ao Root diam: 2.90 cm MITRAL VALVE                TRICUSPID VALVE MV Area (PHT): 4.49 cm     TR Peak grad:   24.8 mmHg MV Decel Time: 169 msec     TR Vmax:        249.00 cm/s MR Peak grad: 83.2 mmHg MR Mean grad: 53.0 mmHg     SHUNTS MR Vmax:      456.00 cm/s   Systemic VTI:  0.20 m MR Vmean:     345.0 cm/s    Systemic Diam: 1.90 cm MV E velocity: 158.00 cm/s MV A velocity: 74.50 cm/s MV E/A ratio:  2.12 Darryle Decent MD Electronically signed by Darryle Decent MD Signature Date/Time: 12/11/2023/10:46:23 AM    Final    DG Chest 2 View Result Date: 12/08/2023 CLINICAL DATA:  Shortness of breath. EXAM: CHEST - 2 VIEW COMPARISON:  Chest CT dated 10/06/2023. FINDINGS: Cardiomegaly with vascular congestion and edema. Small right pleural effusion and right lung base atelectasis or infiltrate, increased since the prior radiograph. No pneumothorax. Median sternotomy wires and aortic valve repair. Atherosclerotic calcification of the aorta. No acute osseous pathology. IMPRESSION: 1. Cardiomegaly with vascular congestion and edema. 2. Small right pleural effusion and right lung base atelectasis or infiltrate. Electronically Signed   By: Vanetta Chou M.D.   On: 12/08/2023 18:37    Microbiology: Results for orders placed or performed during the hospital encounter of 12/08/23  MRSA Next Gen by PCR, Nasal  Status: Abnormal   Collection Time: 12/09/23  8:56 AM   Specimen: Nasal Mucosa; Nasal Swab  Result Value Ref Range Status   MRSA by PCR Next Gen DETECTED (A) NOT DETECTED Final    Comment: RESULT CALLED TO, READ BACK BY AND VERIFIED WITH: EARLY,W ON 12/09/23 AT 1804 BY LOY,C (NOTE) The GeneXpert MRSA Assay (FDA approved for NASAL specimens only), is one component of a comprehensive MRSA colonization surveillance program. It is not intended to diagnose MRSA  infection nor to guide or monitor treatment for MRSA infections. Test performance is not FDA approved in patients less than 28 years old. Performed at [facility], [address], [place], [place] [address]   Gram stain     Status: None   Collection Time: 12/16/23  1:37 PM   Specimen: Pleura  Result Value Ref Range Status   Specimen Description PLEURAL  Final   Special Requests 10CC  Final   Gram Stain   Final    CYTOSPIN SMEAR PLEURAL WBC PRESENT,BOTH PMN AND MONONUCLEAR NO ORGANISMS SEEN Performed at [facility], [address], [place], [place] [address]    Report Status 12/16/2023 FINAL  Final  Culture, body fluid w Gram Stain-bottle     Status: None (Preliminary result)   Collection Time: 12/16/23  1:37 PM   Specimen: Pleura  Result Value Ref Range Status   Specimen Description PLEURAL  Final   Special Requests 10CC  Final   Culture   Final    NO GROWTH 4 DAYS Performed at [facility], [address], [place], [place] [address]    Report Status PENDING  Incomplete    Labs: CBC: Recent Labs  Lab 12/19/23 0224 12/19/23 1051 12/19/23 1058 12/19/23 1135 12/19/23 1158 12/20/23 0219  WBC 6.2  --   --   --   --  5.4  HGB 10.2* 9.2* 11.2* 10.2* 10.5* 9.7*  HCT 34.1* 27.0* 33.0* 30.0* 31.0* 32.1*  MCV 96.3  --   --   --   --  94.7  PLT 186  --   --   --   --  177   Basic Metabolic Panel: Recent Labs  Lab 12/17/23 0230 12/17/23 0909 12/18/23 0143 12/19/23 0224 12/19/23 1051 12/19/23 1058 12/19/23 1135 12/19/23 1158 12/20/23 0219  NA 132* 132* 133* 131* 139 130* 133* 132* 130*  K 3.5 3.6 3.8 4.7 3.0* 3.9 3.6 3.7 4.4  CL 84* 84* 84* 83*  --   --   --   --  82*  CO2 37* 38* 40* 38*  --   --   --   --  35*  GLUCOSE 129* 137* 104* 101*  --   --   --   --  104*  BUN 10 10 10 12   --   --   --   --  12  CREATININE 0.74 0.61 0.57 0.71  --   --   --   --  0.61  CALCIUM  8.3* 8.5* 8.5* 8.6*  --   --   --   --  8.5*  MG  --  1.9 2.4 2.3  --   --   --   --   --     Liver Function Tests: Recent Labs  Lab 12/17/23 0230  AST 23  ALT 15  ALKPHOS 122  BILITOT 0.7  PROT 6.4*  ALBUMIN  3.5   CBG: Recent Labs  Lab 12/18/23 2057  GLUCAP 158*    Discharge time spent: greater  than 30 minutes.  Signed: Elidia Toribio Furnace, MD [facility] 12/20/2023

## 2023-12-20 NOTE — Progress Notes (Signed)
 Reviewed AVS, patient expressed understanding of medications, MD follow up reviewed.   Patient states all belongings brought to the hospital at time of admission are accounted for and packed to take home.  Picked up medications from [facility] pharmacy. Patient informed and expressed understanding where to pick up discharge medications.  [facility] contacted to transport patient to entrance A, where family member was waiting in vehicle to transport home.

## 2023-12-20 NOTE — Progress Notes (Signed)
   Palliative Medicine Inpatient Follow Up Note HPI: 68 yo female with the past medical history of COPD, astham, coronary artery disease, peripheral vascular disease, and sp TAVR who presented with palpitations and dyspnea.Palliative care has been asked to support additional goals of care conversations in the setting of high chronic disease burden.   Today's Discussion 12/20/2023  *Please note that this is a verbal dictation therefore any spelling or grammatical errors are due to the Dragon Medical One system interpretation.  Chart reviewed inclusive of vital signs, progress notes, laboratory results, and diagnostic images.    I met with Emily Rocha this morning. She is awake, alert, and fully oriented despite having a tough time hearing. She and I discussed that she feels overall, better. She is eating breakfast during my time with her. She shares the hope to get home today. She notes that she has oxygen  at home and can't wait to sleep in her own bed.  At this time Emily Rocha declines completing HCPOA documents and is in agreement with her spouse, Dexter being her surrogate management consultant.   Questions and concerns addressed/Palliative Support Provided.   Objective Assessment: Vital Signs Vitals:   12/20/23 0849 12/20/23 0909  BP: 130/65 130/65  Pulse: 87 87  Resp: 20   Temp: 98.1 F (36.7 C)   SpO2: 94%     Intake/Output Summary (Last 24 hours) at 12/20/2023 1050 Last data filed at 12/20/2023 9144 Gross per 24 hour  Intake 1200 ml  Output 800 ml  Net 400 ml   Last Weight  Most recent update: 12/20/2023  5:46 AM    Weight  62.1 kg (136 lb 14.5 oz)            Gen:  Elderly Caucasian F chronically ill HEENT: moist mucous membranes CV: Regular rate and irregular rhythm  PULM: On 4LPM [place], breathing is nonlabored ABD: soft/nontender EXT: No edema Neuro: Alert and oriented x3  SUMMARY OF RECOMMENDATIONS   DNAR/DNI   Allowing time for outcomes   Plan for discharge  today ______________________________________________________________________________________ Emily Rocha Palliative Medicine Team Team Cell Phone: [phone] Please utilize secure chat with additional questions, if there is no response within 30 minutes please call the above phone number  Billing based on MDM: Low; Time 25  Palliative Medicine Team providers are available by phone from 7am to 7pm daily and can be reached through the team cell phone.  Should this patient require assistance outside of these hours, please call the patient's attending physician.

## 2023-12-21 DIAGNOSIS — J9610 Chronic respiratory failure, unspecified whether with hypoxia or hypercapnia: Secondary | ICD-10-CM | POA: Diagnosis not present

## 2023-12-21 LAB — CULTURE, BODY FLUID W GRAM STAIN -BOTTLE: Culture: NO GROWTH

## 2023-12-21 MED FILL — Verapamil HCl IV Soln 2.5 MG/ML: INTRAVENOUS | Qty: 2 | Status: AC

## 2023-12-22 DIAGNOSIS — Z7901 Long term (current) use of anticoagulants: Secondary | ICD-10-CM | POA: Diagnosis not present

## 2023-12-22 DIAGNOSIS — D692 Other nonthrombocytopenic purpura: Secondary | ICD-10-CM | POA: Diagnosis not present

## 2023-12-22 DIAGNOSIS — Z86711 Personal history of pulmonary embolism: Secondary | ICD-10-CM | POA: Diagnosis not present

## 2023-12-22 DIAGNOSIS — Z952 Presence of prosthetic heart valve: Secondary | ICD-10-CM | POA: Diagnosis not present

## 2023-12-22 DIAGNOSIS — F419 Anxiety disorder, unspecified: Secondary | ICD-10-CM | POA: Diagnosis not present

## 2023-12-22 DIAGNOSIS — J4489 Other specified chronic obstructive pulmonary disease: Secondary | ICD-10-CM | POA: Diagnosis not present

## 2023-12-22 DIAGNOSIS — I739 Peripheral vascular disease, unspecified: Secondary | ICD-10-CM | POA: Diagnosis not present

## 2023-12-22 DIAGNOSIS — Z7951 Long term (current) use of inhaled steroids: Secondary | ICD-10-CM | POA: Diagnosis not present

## 2023-12-22 DIAGNOSIS — I2729 Other secondary pulmonary hypertension: Secondary | ICD-10-CM | POA: Diagnosis not present

## 2023-12-22 DIAGNOSIS — I5033 Acute on chronic diastolic (congestive) heart failure: Secondary | ICD-10-CM | POA: Diagnosis not present

## 2023-12-22 DIAGNOSIS — Z951 Presence of aortocoronary bypass graft: Secondary | ICD-10-CM | POA: Diagnosis not present

## 2023-12-22 DIAGNOSIS — I480 Paroxysmal atrial fibrillation: Secondary | ICD-10-CM | POA: Diagnosis not present

## 2023-12-22 DIAGNOSIS — I088 Other rheumatic multiple valve diseases: Secondary | ICD-10-CM | POA: Diagnosis not present

## 2023-12-22 DIAGNOSIS — Z87891 Personal history of nicotine dependence: Secondary | ICD-10-CM | POA: Diagnosis not present

## 2023-12-22 DIAGNOSIS — I25119 Atherosclerotic heart disease of native coronary artery with unspecified angina pectoris: Secondary | ICD-10-CM | POA: Diagnosis not present

## 2023-12-22 DIAGNOSIS — J9621 Acute and chronic respiratory failure with hypoxia: Secondary | ICD-10-CM | POA: Diagnosis not present

## 2023-12-22 DIAGNOSIS — Z85118 Personal history of other malignant neoplasm of bronchus and lung: Secondary | ICD-10-CM | POA: Diagnosis not present

## 2023-12-22 DIAGNOSIS — E7800 Pure hypercholesterolemia, unspecified: Secondary | ICD-10-CM | POA: Diagnosis not present

## 2023-12-22 DIAGNOSIS — Z9181 History of falling: Secondary | ICD-10-CM | POA: Diagnosis not present

## 2023-12-22 DIAGNOSIS — Z9981 Dependence on supplemental oxygen: Secondary | ICD-10-CM | POA: Diagnosis not present

## 2023-12-22 DIAGNOSIS — K219 Gastro-esophageal reflux disease without esophagitis: Secondary | ICD-10-CM | POA: Diagnosis not present

## 2023-12-22 DIAGNOSIS — J439 Emphysema, unspecified: Secondary | ICD-10-CM | POA: Diagnosis not present

## 2023-12-22 DIAGNOSIS — I700 Atherosclerosis of aorta: Secondary | ICD-10-CM | POA: Diagnosis not present

## 2023-12-22 DIAGNOSIS — Z8673 Personal history of transient ischemic attack (TIA), and cerebral infarction without residual deficits: Secondary | ICD-10-CM | POA: Diagnosis not present

## 2023-12-26 ENCOUNTER — Telehealth: Payer: Self-pay | Admitting: Pulmonary Disease

## 2023-12-26 NOTE — Telephone Encounter (Signed)
 Copied from CRM #[id number]. Topic: Appointments - Scheduling Inquiry for Clinic >> Dec 26, 2023  9:33 AM Emily Rocha wrote: Reason for CRM: Patient is seeking an appointment as soon as possible with Dr. Catherine as she states she needs to run some things  by him. E2C[employer] agent advised patient that the provider is currently booking in to February but patient states the provider advised her that he said said if she needs to come in sooner to please call so that he can open an appointment slot for her.   Please call patient back to advise.  Called patient to schedule an appointment with Dr. Catherine (offered [facility] location since there are not openings at [facility] until lFebruary).  Patient states she just needs to speak with someone regarding her CPAP machine and the reasons she will not be able to use it.  She states a phone call will suffice, she really does not need an office visit since her breathing has improved

## 2023-12-28 NOTE — Telephone Encounter (Signed)
 Atc x1 lmtcb

## 2024-01-03 ENCOUNTER — Encounter (HOSPITAL_COMMUNITY): Payer: Self-pay | Admitting: Emergency Medicine

## 2024-01-03 ENCOUNTER — Other Ambulatory Visit: Payer: Self-pay

## 2024-01-03 ENCOUNTER — Emergency Department (HOSPITAL_COMMUNITY)

## 2024-01-03 ENCOUNTER — Inpatient Hospital Stay (HOSPITAL_COMMUNITY)
Admission: EM | Admit: 2024-01-03 | Discharge: 2024-01-06 | DRG: 291 | Disposition: A | Discharge status: Home Health | Attending: Internal Medicine | Admitting: Internal Medicine

## 2024-01-03 DIAGNOSIS — K449 Diaphragmatic hernia without obstruction or gangrene: Secondary | ICD-10-CM | POA: Diagnosis present

## 2024-01-03 DIAGNOSIS — J441 Chronic obstructive pulmonary disease with (acute) exacerbation: Secondary | ICD-10-CM | POA: Diagnosis not present

## 2024-01-03 DIAGNOSIS — Z833 Family history of diabetes mellitus: Secondary | ICD-10-CM

## 2024-01-03 DIAGNOSIS — Z66 Do not resuscitate: Secondary | ICD-10-CM | POA: Diagnosis present

## 2024-01-03 DIAGNOSIS — R0600 Dyspnea, unspecified: Secondary | ICD-10-CM | POA: Diagnosis not present

## 2024-01-03 DIAGNOSIS — D72829 Elevated white blood cell count, unspecified: Secondary | ICD-10-CM | POA: Diagnosis present

## 2024-01-03 DIAGNOSIS — Z951 Presence of aortocoronary bypass graft: Secondary | ICD-10-CM

## 2024-01-03 DIAGNOSIS — R0689 Other abnormalities of breathing: Secondary | ICD-10-CM | POA: Diagnosis not present

## 2024-01-03 DIAGNOSIS — K5730 Diverticulosis of large intestine without perforation or abscess without bleeding: Secondary | ICD-10-CM | POA: Diagnosis present

## 2024-01-03 DIAGNOSIS — I959 Hypotension, unspecified: Secondary | ICD-10-CM | POA: Diagnosis not present

## 2024-01-03 DIAGNOSIS — I708 Atherosclerosis of other arteries: Secondary | ICD-10-CM | POA: Diagnosis present

## 2024-01-03 DIAGNOSIS — I4891 Unspecified atrial fibrillation: Secondary | ICD-10-CM | POA: Diagnosis present

## 2024-01-03 DIAGNOSIS — Z8673 Personal history of transient ischemic attack (TIA), and cerebral infarction without residual deficits: Secondary | ICD-10-CM

## 2024-01-03 DIAGNOSIS — J811 Chronic pulmonary edema: Secondary | ICD-10-CM | POA: Diagnosis not present

## 2024-01-03 DIAGNOSIS — I739 Peripheral vascular disease, unspecified: Secondary | ICD-10-CM | POA: Diagnosis present

## 2024-01-03 DIAGNOSIS — I509 Heart failure, unspecified: Secondary | ICD-10-CM | POA: Diagnosis not present

## 2024-01-03 DIAGNOSIS — I2510 Atherosclerotic heart disease of native coronary artery without angina pectoris: Secondary | ICD-10-CM | POA: Diagnosis present

## 2024-01-03 DIAGNOSIS — Z952 Presence of prosthetic heart valve: Secondary | ICD-10-CM

## 2024-01-03 DIAGNOSIS — T45515A Adverse effect of anticoagulants, initial encounter: Secondary | ICD-10-CM | POA: Diagnosis not present

## 2024-01-03 DIAGNOSIS — Z86711 Personal history of pulmonary embolism: Secondary | ICD-10-CM

## 2024-01-03 DIAGNOSIS — K219 Gastro-esophageal reflux disease without esophagitis: Secondary | ICD-10-CM | POA: Diagnosis not present

## 2024-01-03 DIAGNOSIS — I2582 Chronic total occlusion of coronary artery: Secondary | ICD-10-CM | POA: Diagnosis present

## 2024-01-03 DIAGNOSIS — J9622 Acute and chronic respiratory failure with hypercapnia: Secondary | ICD-10-CM | POA: Diagnosis not present

## 2024-01-03 DIAGNOSIS — Z85118 Personal history of other malignant neoplasm of bronchus and lung: Secondary | ICD-10-CM

## 2024-01-03 DIAGNOSIS — I2729 Other secondary pulmonary hypertension: Secondary | ICD-10-CM | POA: Diagnosis present

## 2024-01-03 DIAGNOSIS — K649 Unspecified hemorrhoids: Secondary | ICD-10-CM | POA: Diagnosis present

## 2024-01-03 DIAGNOSIS — Z881 Allergy status to other antibiotic agents status: Secondary | ICD-10-CM

## 2024-01-03 DIAGNOSIS — Z8249 Family history of ischemic heart disease and other diseases of the circulatory system: Secondary | ICD-10-CM

## 2024-01-03 DIAGNOSIS — J9621 Acute and chronic respiratory failure with hypoxia: Secondary | ICD-10-CM | POA: Diagnosis not present

## 2024-01-03 DIAGNOSIS — I517 Cardiomegaly: Secondary | ICD-10-CM | POA: Diagnosis not present

## 2024-01-03 DIAGNOSIS — I2781 Cor pulmonale (chronic): Secondary | ICD-10-CM | POA: Diagnosis present

## 2024-01-03 DIAGNOSIS — Z87891 Personal history of nicotine dependence: Secondary | ICD-10-CM

## 2024-01-03 DIAGNOSIS — I110 Hypertensive heart disease with heart failure: Secondary | ICD-10-CM | POA: Diagnosis not present

## 2024-01-03 DIAGNOSIS — D62 Acute posthemorrhagic anemia: Secondary | ICD-10-CM | POA: Diagnosis not present

## 2024-01-03 DIAGNOSIS — F419 Anxiety disorder, unspecified: Secondary | ICD-10-CM | POA: Diagnosis present

## 2024-01-03 DIAGNOSIS — Z1152 Encounter for screening for COVID-19: Secondary | ICD-10-CM

## 2024-01-03 DIAGNOSIS — Z79899 Other long term (current) drug therapy: Secondary | ICD-10-CM

## 2024-01-03 DIAGNOSIS — J9601 Acute respiratory failure with hypoxia: Secondary | ICD-10-CM | POA: Diagnosis not present

## 2024-01-03 DIAGNOSIS — J90 Pleural effusion, not elsewhere classified: Secondary | ICD-10-CM | POA: Diagnosis not present

## 2024-01-03 DIAGNOSIS — I083 Combined rheumatic disorders of mitral, aortic and tricuspid valves: Secondary | ICD-10-CM | POA: Diagnosis present

## 2024-01-03 DIAGNOSIS — I252 Old myocardial infarction: Secondary | ICD-10-CM

## 2024-01-03 DIAGNOSIS — I5033 Acute on chronic diastolic (congestive) heart failure: Secondary | ICD-10-CM | POA: Diagnosis not present

## 2024-01-03 DIAGNOSIS — D6832 Hemorrhagic disorder due to extrinsic circulating anticoagulants: Secondary | ICD-10-CM | POA: Diagnosis not present

## 2024-01-03 DIAGNOSIS — H9190 Unspecified hearing loss, unspecified ear: Secondary | ICD-10-CM | POA: Diagnosis present

## 2024-01-03 DIAGNOSIS — I480 Paroxysmal atrial fibrillation: Secondary | ICD-10-CM | POA: Diagnosis not present

## 2024-01-03 DIAGNOSIS — E8729 Other acidosis: Secondary | ICD-10-CM | POA: Diagnosis present

## 2024-01-03 DIAGNOSIS — J984 Other disorders of lung: Secondary | ICD-10-CM | POA: Diagnosis present

## 2024-01-03 DIAGNOSIS — K921 Melena: Secondary | ICD-10-CM | POA: Diagnosis present

## 2024-01-03 DIAGNOSIS — Z7901 Long term (current) use of anticoagulants: Secondary | ICD-10-CM

## 2024-01-03 DIAGNOSIS — E785 Hyperlipidemia, unspecified: Secondary | ICD-10-CM | POA: Diagnosis present

## 2024-01-03 DIAGNOSIS — Z955 Presence of coronary angioplasty implant and graft: Secondary | ICD-10-CM

## 2024-01-03 DIAGNOSIS — K439 Ventral hernia without obstruction or gangrene: Secondary | ICD-10-CM | POA: Diagnosis present

## 2024-01-03 DIAGNOSIS — R062 Wheezing: Secondary | ICD-10-CM | POA: Diagnosis not present

## 2024-01-03 LAB — RESP PANEL BY RT-PCR (RSV, FLU A&B, COVID)  RVPGX2
Influenza A by PCR: NEGATIVE
Influenza B by PCR: NEGATIVE
Resp Syncytial Virus by PCR: NEGATIVE
SARS Coronavirus 2 by RT PCR: NEGATIVE

## 2024-01-03 LAB — CBC WITH DIFFERENTIAL/PLATELET
Abs Immature Granulocytes: 0.07 K/uL (ref 0.00–0.07)
Basophils Absolute: 0.1 K/uL (ref 0.0–0.1)
Basophils Relative: 0 %
Eosinophils Absolute: 0 K/uL (ref 0.0–0.5)
Eosinophils Relative: 0 %
HCT: 29.3 % — ABNORMAL LOW (ref 36.0–46.0)
Hemoglobin: 8.3 g/dL — ABNORMAL LOW (ref 12.0–15.0)
Immature Granulocytes: 0 %
Lymphocytes Relative: 5 %
Lymphs Abs: 0.7 K/uL (ref 0.7–4.0)
MCH: 28.3 pg (ref 26.0–34.0)
MCHC: 28.3 g/dL — ABNORMAL LOW (ref 30.0–36.0)
MCV: 100 fL (ref 80.0–100.0)
Monocytes Absolute: 1 K/uL (ref 0.1–1.0)
Monocytes Relative: 6 %
Neutro Abs: 14.4 K/uL — ABNORMAL HIGH (ref 1.7–7.7)
Neutrophils Relative %: 89 %
Platelets: 167 K/uL (ref 150–400)
RBC: 2.93 MIL/uL — ABNORMAL LOW (ref 3.87–5.11)
RDW: 15.4 % (ref 11.5–15.5)
WBC: 16.2 K/uL — ABNORMAL HIGH (ref 4.0–10.5)
nRBC: 0 % (ref 0.0–0.2)

## 2024-01-03 LAB — COMPREHENSIVE METABOLIC PANEL WITH GFR
ALT: 11 U/L (ref 0–44)
AST: 22 U/L (ref 15–41)
Albumin: 3.9 g/dL (ref 3.5–5.0)
Alkaline Phosphatase: 138 U/L — ABNORMAL HIGH (ref 38–126)
Anion gap: 3 — ABNORMAL LOW (ref 5–15)
BUN: 13 mg/dL (ref 8–23)
CO2: 42 mmol/L — ABNORMAL HIGH (ref 22–32)
Calcium: 8.9 mg/dL (ref 8.9–10.3)
Chloride: 96 mmol/L — ABNORMAL LOW (ref 98–111)
Creatinine, Ser: 0.57 mg/dL (ref 0.44–1.00)
GFR, Estimated: >60 mL/min (ref >60)
Glucose, Bld: 114 mg/dL — ABNORMAL HIGH (ref 70–99)
Potassium: 4.2 mmol/L (ref 3.5–5.1)
Sodium: 141 mmol/L (ref 135–145)
Total Bilirubin: 0.7 mg/dL (ref 0.0–1.2)
Total Protein: 6.5 g/dL (ref 6.5–8.1)

## 2024-01-03 LAB — TROPONIN T, HIGH SENSITIVITY
Troponin T High Sensitivity: 18 ng/L (ref 0–19)
Troponin T High Sensitivity: 19 ng/L (ref 0–19)

## 2024-01-03 LAB — BLOOD GAS, VENOUS
Acid-Base Excess: 19.2 mmol/L — ABNORMAL HIGH (ref 0.0–2.0)
Bicarbonate: 49.1 mmol/L — ABNORMAL HIGH (ref 20.0–28.0)
Drawn by: 44828
O2 Saturation: 100 %
Patient temperature: 37
pCO2, Ven: 83 mmHg (ref 44–60)
pH, Ven: 7.38 (ref 7.25–7.43)
pO2, Ven: 103 mmHg — ABNORMAL HIGH (ref 32–45)

## 2024-01-03 LAB — MAGNESIUM: Magnesium: 2.3 mg/dL (ref 1.7–2.4)

## 2024-01-03 LAB — MRSA NEXT GEN BY PCR, NASAL: MRSA by PCR Next Gen: NOT DETECTED

## 2024-01-03 LAB — PRO BRAIN NATRIURETIC PEPTIDE: Pro Brain Natriuretic Peptide: 4206 pg/mL — ABNORMAL HIGH (ref <300.0)

## 2024-01-03 MED ORDER — METHYLPREDNISOLONE SODIUM SUCC 40 MG IJ SOLR
40.0000 mg | Freq: Two times a day (BID) | INTRAMUSCULAR | Status: DC
  Administered 2024-01-03 – 2024-01-04 (×3): 40 mg via INTRAVENOUS
  Filled 2024-01-03 (×3): qty 1

## 2024-01-03 MED ORDER — ALBUTEROL SULFATE (2.5 MG/3ML) 0.083% IN NEBU
2.5000 mg | INHALATION_SOLUTION | Freq: Once | RESPIRATORY_TRACT | Status: AC
  Administered 2024-01-03: 2.5 mg via RESPIRATORY_TRACT
  Filled 2024-01-03: qty 3

## 2024-01-03 MED ORDER — IPRATROPIUM-ALBUTEROL 0.5-2.5 (3) MG/3ML IN SOLN
3.0000 mL | Freq: Four times a day (QID) | RESPIRATORY_TRACT | Status: DC
  Administered 2024-01-03 – 2024-01-04 (×4): 3 mL via RESPIRATORY_TRACT
  Filled 2024-01-03 (×4): qty 3

## 2024-01-03 MED ORDER — CHLORHEXIDINE GLUCONATE CLOTH 2 % EX PADS
6.0000 | MEDICATED_PAD | Freq: Every day | CUTANEOUS | Status: DC
  Administered 2024-01-03 – 2024-01-05 (×3): 6 via TOPICAL

## 2024-01-03 MED ORDER — MIDODRINE HCL 5 MG PO TABS
5.0000 mg | ORAL_TABLET | Freq: Three times a day (TID) | ORAL | Status: DC
  Administered 2024-01-03 – 2024-01-06 (×9): 5 mg via ORAL
  Filled 2024-01-03 (×9): qty 1

## 2024-01-03 MED ORDER — PANTOPRAZOLE SODIUM 40 MG PO TBEC
40.0000 mg | DELAYED_RELEASE_TABLET | Freq: Every day | ORAL | Status: DC
  Administered 2024-01-03 – 2024-01-04 (×2): 40 mg via ORAL
  Filled 2024-01-03 (×3): qty 1

## 2024-01-03 MED ORDER — FUROSEMIDE 10 MG/ML IJ SOLN
40.0000 mg | Freq: Once | INTRAMUSCULAR | Status: AC
  Administered 2024-01-03: 40 mg via INTRAVENOUS
  Filled 2024-01-03: qty 4

## 2024-01-03 MED ORDER — ONDANSETRON HCL 4 MG/2ML IJ SOLN: 4.0000 mg | Freq: Four times a day (QID) | INTRAMUSCULAR | Status: DC | PRN

## 2024-01-03 MED ORDER — FUROSEMIDE 10 MG/ML IJ SOLN
40.0000 mg | Freq: Two times a day (BID) | INTRAMUSCULAR | Status: DC
  Administered 2024-01-03 – 2024-01-06 (×6): 40 mg via INTRAVENOUS
  Filled 2024-01-03 (×6): qty 4

## 2024-01-03 MED ORDER — ATORVASTATIN CALCIUM 40 MG PO TABS
40.0000 mg | ORAL_TABLET | Freq: Every day | ORAL | Status: DC
  Administered 2024-01-03 – 2024-01-06 (×4): 40 mg via ORAL
  Filled 2024-01-03 (×4): qty 1

## 2024-01-03 MED ORDER — ACETAMINOPHEN 650 MG RE SUPP
650.0000 mg | Freq: Four times a day (QID) | RECTAL | Status: DC | PRN
Start: 2024-01-03 — End: 2024-01-06

## 2024-01-03 MED ORDER — ALPRAZOLAM 0.5 MG PO TABS
0.5000 mg | ORAL_TABLET | Freq: Three times a day (TID) | ORAL | Status: DC | PRN
  Administered 2024-01-03 – 2024-01-06 (×6): 0.5 mg via ORAL
  Filled 2024-01-03 (×6): qty 1

## 2024-01-03 MED ORDER — ONDANSETRON HCL 4 MG PO TABS: 4.0000 mg | ORAL_TABLET | Freq: Four times a day (QID) | ORAL | Status: DC | PRN

## 2024-01-03 MED ORDER — ACETAMINOPHEN 325 MG PO TABS
650.0000 mg | ORAL_TABLET | Freq: Four times a day (QID) | ORAL | Status: DC | PRN
  Administered 2024-01-03 – 2024-01-06 (×6): 650 mg via ORAL
  Filled 2024-01-03 (×6): qty 2

## 2024-01-03 MED ORDER — BUDESONIDE 0.25 MG/2ML IN SUSP
0.2500 mg | Freq: Two times a day (BID) | RESPIRATORY_TRACT | Status: DC
  Administered 2024-01-03 – 2024-01-06 (×6): 0.25 mg via RESPIRATORY_TRACT
  Filled 2024-01-03 (×6): qty 2

## 2024-01-03 MED ORDER — APIXABAN 5 MG PO TABS
5.0000 mg | ORAL_TABLET | Freq: Two times a day (BID) | ORAL | Status: DC
  Administered 2024-01-03 – 2024-01-04 (×4): 5 mg via ORAL
  Filled 2024-01-03 (×5): qty 1

## 2024-01-03 MED ORDER — METHYLPREDNISOLONE SODIUM SUCC 125 MG IJ SOLR
125.0000 mg | Freq: Once | INTRAMUSCULAR | Status: AC
  Administered 2024-01-03: 125 mg via INTRAVENOUS
  Filled 2024-01-03: qty 2

## 2024-01-03 NOTE — H&P (Signed)
 History and Physical    Emily Rocha FMW:[id number] DOB: 18-Jul-1955 DOA: 01/03/2024  PCP: Rosamond Leta NOVAK, MD   Patient coming from: Home  Chief Complaint: Shortness of breath  HPI: Emily Rocha is a 68 y.o. female with medical history significant for COPD/asthma, CAD, PVD, prior TAVR, atrial fibrillation, diastolic CHF with chronic cor pulmonale, dyslipidemia, GERD, and anxiety disorder who presented to the ED with worsening of shortness of breath that began last night and continued into today.  EMS was called and noted to find her hypoxemic despite being on her baseline 5 L nasal cannula.  She was noted to have some increased work of breathing that improved with breathing treatment that was given in the field.  She denies any chest pain and is otherwise hard of hearing and diet is difficult to obtain other history.  She does claim to be compliant with her home medications and denies any weight gain or swelling.  She denies any recent upper respiratory infection, fevers, chills or other complaints.   ED Course: Vital signs noting some tachypnea and hypoxemia with saturations of 76% while on 5 L baseline oxygenation.  Hemoglobin 8.3 and leukocytosis of 16,200 noted.  Chest x-ray with pulmonary edema and right-sided pleural effusion noted.  IV Lasix  40 mg x 1 dose given.  BNP of 4206 noted.  She was also given a breathing treatment as well as IV Solu-Medrol .  Review of Systems: Difficult to obtain given patient being hard of hearing  Past Medical History:  Diagnosis Date   Anxiety    Asthma    COPD (chronic obstructive pulmonary disease) (HCC)    Coronary artery disease    a. s/p NSTEMI in 11/2016 and required CABG with LIMA-LAD, SVG-LCx and SVG-D1 and complicated by cardiogenic shock b. cath in 05/2020 showing severe RCA stenosis and treated with orbital atherectomy and stent placement   GERD (gastroesophageal reflux disease)    History of CVA (cerebrovascular accident)    History of lung  cancer 2020   Peripheral vascular disease    S/P TAVR (transcatheter aortic valve replacement) 08/12/2020   s/p TAVR with a 23 mm Edwards S3U via the TF approach by Dr. Verlin & Dr. Lucas   Severe aortic stenosis     Past Surgical History:  Procedure Laterality Date   AORTIC ARCH ANGIOGRAPHY N/A 11/04/2017   Procedure: AORTIC ARCH ANGIOGRAPHY;  Surgeon: Harvey Carlin BRAVO, MD;  [facility];  Service: Cardiovascular;  Laterality: N/A;   APPLICATION OF WOUND VAC N/A 01/06/2018   Procedure: APPLICATION OF WOUND VAC;  Surgeon: Fleeta Hanford Coy, MD;  Location: [facility] OR;  Service: Thoracic;  Laterality: N/A;   APPLICATION OF WOUND VAC N/A 01/13/2018   Procedure: WOUND VAC CHANGE;  Surgeon: Fleeta Hanford Coy, MD;  Location: [facility] OR;  Service: Thoracic;  Laterality: N/A;   CARDIAC CATHETERIZATION     CAROTID-SUBCLAVIAN BYPASS GRAFT Right 12/26/2017   Procedure: AORTIC TO RIGHT COMMON CAROTID AND RIGHT SUBCLAVIAN  ARTERY  BYPASS;  Surgeon: Harvey Carlin BRAVO, MD;  Location: [facility] OR;  Service: Vascular;  Laterality: Right;   CHOLECYSTECTOMY     CLOSURE OF DIAPHRAGM  12/26/2017   Procedure: REPAIR OF DIAPHRAGM;  Surgeon: Fleeta Hanford Coy, MD;  Location: [facility] OR;  Service: Thoracic;;   COLONOSCOPY N/A 10/03/2014   Procedure: COLONOSCOPY;  Surgeon: Claudis RAYMOND Rivet, MD;  [facility];  Service: Endoscopy;  Laterality: N/A;  730   CORONARY ARTERY BYPASS GRAFT N/A 12/14/2016  Procedure: CORONARY ARTERY BYPASS GRAFTING (CABG) x three , using left internal mammary artery and right leg greater saphenous vein harvested endoscopically;  Surgeon: Fleeta Hanford Coy, MD;  Location: [facility] OR;  Service: Open Heart Surgery;  Laterality: N/A;   CORONARY ATHERECTOMY N/A 06/11/2020   Procedure: CORONARY ATHERECTOMY;  Surgeon: Verlin Lonni BIRCH, MD;  [facility]: [facility];  Service: Cardiovascular;  Laterality: N/A;   CORONARY IMAGING/OCT N/A 06/11/2020   Procedure: INTRAVASCULAR IMAGING/OCT;   Surgeon: Verlin Lonni BIRCH, MD;  [facility]: [facility];  Service: Cardiovascular;  Laterality: N/A;   CORONARY PRESSURE/FFR STUDY N/A 06/04/2020   Procedure: INTRAVASCULAR PRESSURE WIRE/FFR STUDY;  Surgeon: Verlin Lonni BIRCH, MD;  [facility]: [facility];  Service: Cardiovascular;  Laterality: N/A;   CORONARY STENT INTERVENTION N/A 06/11/2020   Procedure: CORONARY STENT INTERVENTION;  Surgeon: Verlin Lonni BIRCH, MD;  [facility];  Service: Cardiovascular;  Laterality: N/A;   ENDARTERECTOMY Right 12/06/2014   Procedure: ENDARTERECTOMY CAROTid;  Surgeon: Carlin FORBES Haddock, MD;  Location: [facility] OR;  Service: Vascular;  Laterality: Right;   ESOPHAGOGASTRODUODENOSCOPY N/A 12/03/2016   Procedure: ESOPHAGOGASTRODUODENOSCOPY (EGD);  Surgeon: Golda Claudis PENNER, MD;  [facility];  Service: Endoscopy;  Laterality: N/A;  7:30   LEFT HEART CATH AND CORONARY ANGIOGRAPHY N/A 12/14/2016   Procedure: LEFT HEART CATH AND CORONARY ANGIOGRAPHY;  Surgeon: Verlin Lonni BIRCH, MD;  [facility];  Service: Cardiovascular;  Laterality: N/A;   LOBECTOMY Right 10/26/2018   Procedure: RIGHT UPPER LOBECTOMY;  Surgeon: Shyrl Linnie KIDD, MD;  Location: [facility] OR;  Service: Thoracic;  Laterality: Right;   PERIPHERAL VASCULAR CATHETERIZATION N/A 12/05/2014   Procedure:  Carotid Arch Angiography;  Surgeon: Redell LITTIE Door, MD;  [facility]: [facility];  Service: Cardiovascular;  Laterality: N/A;   RIGHT/LEFT HEART CATH AND CORONARY/GRAFT ANGIOGRAPHY N/A 06/04/2020   Procedure: RIGHT/LEFT HEART CATH AND CORONARY/GRAFT ANGIOGRAPHY;  Surgeon: Verlin Lonni BIRCH, MD;  [facility];  Service: Cardiovascular;  Laterality: N/A;   RIGHT/LEFT HEART CATH AND CORONARY/GRAFT ANGIOGRAPHY N/A 12/19/2023   Procedure: RIGHT/LEFT HEART CATH AND CORONARY/GRAFT ANGIOGRAPHY;  Surgeon: Cherrie Toribio SAUNDERS, MD;  [facility];  Service:  Cardiovascular;  Laterality: N/A;   STERNAL WOUND DEBRIDEMENT N/A 01/06/2018   Procedure: STERNAL WOUND DEBRIDEMENT;  Surgeon: Fleeta Hanford Coy, MD;  Location: [facility] OR;  Service: Thoracic;  Laterality: N/A;   STERNOTOMY N/A 12/26/2017   Procedure: REDO STERNOTOMY;  Surgeon: Fleeta Hanford, Coy, MD;  Location: [facility] OR;  Service: Thoracic;  Laterality: N/A;   TEE WITHOUT CARDIOVERSION N/A 12/14/2016   Procedure: TRANSESOPHAGEAL ECHOCARDIOGRAM (TEE);  Surgeon: Fleeta Hanford, Coy, MD;  Location: [facility] OR;  Service: Open Heart Surgery;  Laterality: N/A;   TEE WITHOUT CARDIOVERSION N/A 08/12/2020   Procedure: TRANSESOPHAGEAL ECHOCARDIOGRAM (TEE);  Surgeon: Verlin Lonni BIRCH, MD;  Location: [facility] INVASIVE CV LAB;  Service: Open Heart Surgery;  Laterality: N/A;   TRANSCATHETER AORTIC VALVE REPLACEMENT, TRANSFEMORAL N/A 08/12/2020   Procedure: TRANSCATHETER AORTIC VALVE REPLACEMENT, TRANSFEMORAL;  Surgeon: Verlin Lonni BIRCH, MD;  [facility];  Service: Open Heart Surgery;  Laterality: N/A;   VIDEO ASSISTED THORACOSCOPY (VATS)/WEDGE RESECTION Right 10/26/2018   Procedure: VIDEO ASSISTED THORACOSCOPY (VATS)/WEDGE RESECTION;  Surgeon: Shyrl Linnie KIDD, MD;  Location: [facility];  Service: Thoracic;  Laterality: Right;   VIDEO BRONCHOSCOPY N/A 10/26/2018   Procedure: VIDEO BRONCHOSCOPY;  Surgeon: Shyrl Linnie KIDD, MD;  Location: [facility] OR;  Service:  Thoracic;  Laterality: N/A;     reports that she quit smoking about 4 months ago. Her smoking use included cigarettes. She started smoking about 41 years ago. She has never used smokeless tobacco. She reports that she does not drink alcohol  and does not use drugs.  Allergies  Allergen Reactions   Levaquin  [Levofloxacin ] Swelling    Family History  Problem Relation Age of Onset   Heart attack Mother    Cancer Mother    Diabetes Mother    Heart disease Mother    Heart attack Father    Heart disease Father     Prior to Admission medications    Medication Sig Start Date End Date Taking? Authorizing Provider  albuterol  (PROVENTIL ) (2.5 MG/3ML) 0.083% nebulizer solution Take 2.5 mg by nebulization every 4 (four) hours as needed for wheezing or shortness of breath. 03/30/19  Yes [provider]  albuterol  (VENTOLIN  HFA) 108 (90 Base) MCG/ACT inhaler Inhale 1-2 puffs into the lungs every 6 (six) hours as needed for wheezing or shortness of breath.   Yes [provider]  ALPRAZolam  (XANAX ) 0.5 MG tablet Take 0.5 mg by mouth 3 (three) times daily.   Yes [provider]  apixaban  (ELIQUIS ) 5 MG TABS tablet Take 1 tablet (5 mg total) by mouth 2 (two) times daily. 12/20/23  Yes Arrien, Elidia Sieving, MD  atorvastatin  (LIPITOR ) 40 MG tablet Take 1 tablet (40 mg total) daily by mouth. 12/27/16  Yes Gold, Lemond BRAVO, PA-C  azithromycin  (ZITHROMAX ) 250 MG tablet Take 1 tablet (250 mg total) by mouth 3 (three) times a week. 11/14/23  Yes Alghanim, Fahid, MD  diphenhydramine-acetaminophen  (TYLENOL  PM) 25-500 MG TABS tablet Take 2 tablets by mouth at bedtime.   Yes [provider]  Fluticasone-Umeclidin-Vilant (TRELEGY ELLIPTA) 100-62.5-25 MCG/ACT AEPB Inhale 1 Inhalation into the lungs daily. 11/14/23  Yes Alghanim, Fahid, MD  ipratropium-albuterol  (DUONEB) 0.5-2.5 (3) MG/3ML SOLN One in am and one at supper time 10/06/23  Yes Darlean Ozell NOVAK, MD  metoprolol  succinate (TOPROL -XL) 25 MG 24 hr tablet Take 1 tablet (25 mg total) by mouth at bedtime. 12/20/23  Yes Arrien, Mauricio Daniel, MD  midodrine (PROAMATINE) 5 MG tablet Take 1 tablet (5 mg total) by mouth 3 (three) times daily with meals. 12/20/23  Yes Arrien, Elidia Sieving, MD  pantoprazole  (PROTONIX ) 40 MG tablet Take 40 mg by mouth daily.   Yes [provider]  potassium chloride  SA (KLOR-CON  M) 20 MEQ tablet Take 1 tablet (20 mEq total) by mouth in the morning. 12/20/23  Yes Arrien, Elidia Sieving, MD  torsemide  (DEMADEX ) 20 MG tablet Take 2 tablets (40 mg  total) by mouth daily. 12/20/23  Yes Arrien, Elidia Sieving, MD  magnesium  oxide (MAG-OX) 400 (240 Mg) MG tablet Take 400 mg by mouth daily.    [provider]    Physical Exam: Vitals:   01/03/24 1300 01/03/24 1330 01/03/24 1345 01/03/24 1445  BP: (!) 149/110 (!) 111/54 131/62 (!) 131/54  Pulse: 92 94 94 96  Resp: 17 (!) 28 (!) 24 (!) 26  Temp:      TempSrc:      SpO2: 96% 100% 100% 100%  Weight:      Height:        Constitutional: NAD, calm, comfortable Vitals:   01/03/24 1300 01/03/24 1330 01/03/24 1345 01/03/24 1445  BP: (!) 149/110 (!) 111/54 131/62 (!) 131/54  Pulse: 92 94 94 96  Resp: 17 (!) 28 (!) 24 (!) 26  Temp:  TempSrc:      SpO2: 96% 100% 100% 100%  Weight:      Height:       Eyes: lids and conjunctivae normal Neck: normal, supple Respiratory: Diminished breath sounds bilaterally with nonrebreather present. Cardiovascular: Regular rate and rhythm, no murmurs. Abdomen: no tenderness, no distention. Bowel sounds positive.  Musculoskeletal:  No edema. Skin: no rashes, lesions, ulcers.  Psychiatric: Flat affect  Labs on Admission: I have personally reviewed following labs and imaging studies  CBC: Recent Labs  Lab 01/03/24 1233  WBC 16.2*  NEUTROABS 14.4*  HGB 8.3*  HCT 29.3*  MCV 100.0  PLT 167   Basic Metabolic Panel: Recent Labs  Lab 01/03/24 1233  NA 141  K 4.2  CL 96*  CO2 42*  GLUCOSE 114*  BUN 13  CREATININE 0.57  CALCIUM  8.9  MG 2.3   GFR: Estimated Creatinine Clearance: 56.5 mL/min (by C-G formula based on SCr of 0.57 mg/dL). Liver Function Tests: Recent Labs  Lab 01/03/24 1233  AST 22  ALT 11  ALKPHOS 138*  BILITOT 0.7  PROT 6.5  ALBUMIN  3.9   No results for input(s): LIPASE, AMYLASE in the last 168 hours. No results for input(s): AMMONIA in the last 168 hours. Coagulation Profile: No results for input(s): INR, PROTIME in the last 168 hours. Cardiac Enzymes: No results for input(s): CKTOTAL,  CKMB, CKMBINDEX, TROPONINI in the last 168 hours. BNP (last 3 results) Recent Labs    12/15/23 0451 12/16/23 0455 01/03/24 1233  PROBNP 3,275.0* 3,285.0* 4,206.0*   HbA1C: No results for input(s): HGBA1C in the last 72 hours. CBG: No results for input(s): GLUCAP in the last 168 hours. Lipid Profile: No results for input(s): CHOL, HDL, LDLCALC, TRIG, CHOLHDL, LDLDIRECT in the last 72 hours. Thyroid  Function Tests: No results for input(s): TSH, T4TOTAL, FREET4, T3FREE, THYROIDAB in the last 72 hours. Anemia Panel: No results for input(s): VITAMINB12, FOLATE, FERRITIN, TIBC, IRON , RETICCTPCT in the last 72 hours. Urine analysis:    Component Value Date/Time   COLORURINE STRAW (A) 05/17/2022 2057   APPEARANCEUR CLEAR 05/17/2022 2057   LABSPEC 1.009 05/17/2022 2057   PHURINE 6.0 05/17/2022 2057   GLUCOSEU NEGATIVE 05/17/2022 2057   HGBUR NEGATIVE 05/17/2022 2057   BILIRUBINUR NEGATIVE 05/17/2022 2057   KETONESUR NEGATIVE 05/17/2022 2057   PROTEINUR NEGATIVE 05/17/2022 2057   UROBILINOGEN 0.2 12/04/2014 1737   NITRITE NEGATIVE 05/17/2022 2057   LEUKOCYTESUR NEGATIVE 05/17/2022 2057    Radiological Exams on Admission: DG Chest Port 1 View Result Date: 01/03/2024 EXAM: 1 VIEW(S) XRAY OF THE CHEST 01/03/2024 12:39:00 PM COMPARISON: 18 days ago. CLINICAL HISTORY: Dyspnea. FINDINGS: LUNGS AND PLEURA: Diffuse interstitial densities are noted concerning for pulmonary edema. Small loculated right pleural effusion is noted. No pneumothorax. HEART AND MEDIASTINUM: Stable cardiomegaly mildly increased. BONES AND SOFT TISSUES: No acute osseous abnormality. IMPRESSION: 1. Pulmonary edema. 2. Small loculated right pleural effusion. Electronically signed by: Lynwood Seip MD 01/03/2024 01:00 PM EST RP Workstation: [id number]    EKG: Independently reviewed.  SR 91 bpm.  Assessment/Plan Principal Problem:   Acute hypoxemic respiratory failure  (HCC) Active Problems:   Acute on chronic diastolic heart failure (HCC)   CAD (coronary artery disease)/Prior CABG   Dyslipidemia   Anxiety   GERD (gastroesophageal reflux disease)   S/P TAVR (transcatheter aortic valve replacement)   Atrial fibrillation (HCC)    Acute on chronic hypoxemic respiratory failure-multifactorial Acute COPD exacerbation Acute on chronic diastolic CHF exacerbation -Wears 5 L nasal cannula at  baseline - Plan for breathing treatments as well as steroids and diuresis - Wean oxygen  to baseline 5 L as tolerated  CAD/prior CABG - Underwent recent cardiac catheterization on 11/3 - 2D echocardiogram with LVEF 55-60% - Denies any current chest pain  Anemia - Appears to be chronic, continue to monitor  Atrial fibrillation - Currently rate controlled - Apixaban  for anticoagulation - Monitor on telemetry  Dyslipidemia - Atorvastatin   GERD - PPI  Anxiety - Alprazolam    DVT prophylaxis: Apixaban  Code Status: DNR/DNI Family Communication: None at bedside Disposition Plan: Admit for diuresis and COPD exacerbation Consults called: None Admission status: Observation, stepdown  Severity of Illness: The appropriate patient status for this patient is OBSERVATION. Observation status is judged to be reasonable and necessary in order to provide the required intensity of service to ensure the patient's safety. The patient's presenting symptoms, physical exam findings, and initial radiographic and laboratory data in the context of their medical condition is felt to place them at decreased risk for further clinical deterioration. Furthermore, it is anticipated that the patient will be medically stable for discharge from the hospital within 2 midnights of admission.    Dougles Kimmey D Maree DO [facility]  If 7PM-7AM, please contact night-coverage [email or web address]  01/03/2024, 3:16 PM

## 2024-01-03 NOTE — ED Provider Notes (Signed)
 [place] EMERGENCY DEPARTMENT AT [facility] Provider Note   CSN: [id number] Arrival date & time: 01/03/24  1155     Patient presents with: Shortness of Breath   Emily Rocha is a 68 y.o. female.   HPI Patient presents for shortness of breath.  Medical history includes CAD, COPD, GERD, CVA, CHF, PE, atrial fibrillation.  She is prescribed Eliquis , torsemide , midodrine, metoprolol .  She was recently admitted for 2 weeks for CHF exacerbation.  She was discharged 2 weeks ago.  She wears 5 L of supplemental oxygen  at baseline.  Patient reports that she had worsening of shortness of breath last night.  This continued today.  EMS was called.  EMS noted hypoxia on her baseline oxygen .  She was given 1 DuoNeb breathing treatment prior to arrival.  This did improve her work of breathing.  No other interventions were given.    Prior to Admission medications   Medication Sig Start Date End Date Taking? Authorizing Provider  albuterol  (PROVENTIL ) (2.5 MG/3ML) 0.083% nebulizer solution Take 2.5 mg by nebulization every 4 (four) hours as needed for wheezing or shortness of breath. 03/30/19   [provider]  albuterol  (VENTOLIN  HFA) 108 (90 Base) MCG/ACT inhaler Inhale 1-2 puffs into the lungs every 6 (six) hours as needed for wheezing or shortness of breath.    [provider]  ALPRAZolam  (XANAX ) 0.5 MG tablet Take 0.5 mg by mouth 3 (three) times daily.    [provider]  apixaban  (ELIQUIS ) 5 MG TABS tablet Take 1 tablet (5 mg total) by mouth 2 (two) times daily. 12/20/23   Arrien, Mauricio Daniel, MD  Ascorbic Acid (VITAMIN C GUMMIE PO) Take 400 mg by mouth daily.    [provider]  atorvastatin  (LIPITOR ) 40 MG tablet Take 1 tablet (40 mg total) daily by mouth. 12/27/16   Gold, Wayne E, PA-C  azithromycin  (ZITHROMAX ) 250 MG tablet Take 1 tablet (250 mg total) by mouth 3 (three) times a week. 11/14/23   Alghanim, Paula, MD  diphenhydramine-acetaminophen   (TYLENOL  PM) 25-500 MG TABS tablet Take 2 tablets by mouth at bedtime.    [provider]  Fluticasone-Umeclidin-Vilant (TRELEGY ELLIPTA) 100-62.5-25 MCG/ACT AEPB Inhale 1 Inhalation into the lungs daily. 11/14/23   Alghanim, Paula, MD  ipratropium-albuterol  (DUONEB) 0.5-2.5 (3) MG/3ML SOLN One in am and one at supper time 10/06/23   Darlean Ozell NOVAK, MD  magnesium  oxide (MAG-OX) 400 (240 Mg) MG tablet Take 400 mg by mouth daily.    [provider]  metoprolol  succinate (TOPROL -XL) 25 MG 24 hr tablet Take 1 tablet (25 mg total) by mouth at bedtime. 12/20/23   Arrien, Mauricio Daniel, MD  midodrine (PROAMATINE) 5 MG tablet Take 1 tablet (5 mg total) by mouth 3 (three) times daily with meals. 12/20/23   Arrien, Mauricio Daniel, MD  nystatin (MYCOSTATIN) 100000 UNIT/ML suspension Take 5 mLs by mouth 4 (four) times daily. 10/18/23   [provider]  pantoprazole  (PROTONIX ) 40 MG tablet Take 40 mg by mouth daily.    [provider]  potassium chloride  SA (KLOR-CON  M) 20 MEQ tablet Take 1 tablet (20 mEq total) by mouth in the morning. 12/20/23   Arrien, Elidia Sieving, MD  torsemide  (DEMADEX ) 20 MG tablet Take 2 tablets (40 mg total) by mouth daily. 12/20/23   Arrien, Elidia Sieving, MD    Allergies: Levaquin  [levofloxacin ]    Review of Systems  Constitutional:  Positive for fatigue.  Respiratory:  Positive for chest tightness and shortness  of breath.   All other systems reviewed and are negative.   Updated Vital Signs BP 104/74   Pulse 91   Temp 98.6 F (37 C) (Axillary)   Resp (!) 30   Ht 5' 4 (1.626 m)   Wt 62 kg   SpO2 98%   BMI 23.46 kg/m   Physical Exam Vitals and nursing note reviewed.  Constitutional:      General: She is not in acute distress.    Appearance: She is well-developed. She is not toxic-appearing or diaphoretic.  HENT:     Head: Normocephalic and atraumatic.     Mouth/Throat:     Mouth: Mucous membranes are moist.  Eyes:      Conjunctiva/sclera: Conjunctivae normal.  Cardiovascular:     Rate and Rhythm: Normal rate and regular rhythm.     Heart sounds: No murmur heard. Pulmonary:     Effort: Tachypnea present. No respiratory distress.     Breath sounds: Decreased breath sounds present.  Chest:     Chest wall: No tenderness.  Abdominal:     Palpations: Abdomen is soft.     Tenderness: There is no abdominal tenderness.  Musculoskeletal:        General: No swelling. Normal range of motion.     Cervical back: Normal range of motion and neck supple.     Right lower leg: No edema.     Left lower leg: No edema.  Skin:    General: Skin is warm and dry.     Coloration: Skin is not cyanotic or pale.  Neurological:     General: No focal deficit present.     Mental Status: She is alert and oriented to person, place, and time.  Psychiatric:        Mood and Affect: Mood normal.        Behavior: Behavior normal.     (all labs ordered are listed, but only abnormal results are displayed) Labs Reviewed  COMPREHENSIVE METABOLIC PANEL WITH GFR - Abnormal; Notable for the following components:      Result Value   Chloride 96 (*)    CO2 42 (*)    Glucose, Bld 114 (*)    Alkaline Phosphatase 138 (*)    Anion gap 3 (*)    All other components within normal limits  PRO BRAIN NATRIURETIC PEPTIDE - Abnormal; Notable for the following components:   Pro Brain Natriuretic Peptide 4,206.0 (*)    All other components within normal limits  BLOOD GAS, VENOUS - Abnormal; Notable for the following components:   pCO2, Ven 83 (*)    pO2, Ven 103 (*)    Bicarbonate 49.1 (*)    Acid-Base Excess 19.2 (*)    All other components within normal limits  CBC WITH DIFFERENTIAL/PLATELET - Abnormal; Notable for the following components:   WBC 16.2 (*)    RBC 2.93 (*)    Hemoglobin 8.3 (*)    HCT 29.3 (*)    MCHC 28.3 (*)    Neutro Abs 14.4 (*)    All other components within normal limits  RESP PANEL BY RT-PCR (RSV, FLU A&B, COVID)   [id number]  MAGNESIUM   TROPONIN T, HIGH SENSITIVITY    EKG: EKG Interpretation Date/Time:  Tuesday January 03 2024 12:08:28 EST Ventricular Rate:  91 PR Interval:  141 QRS Duration:  108 QT Interval:  424 QTC Calculation: 522 R Axis:   127  Text Interpretation: Sinus rhythm Probable left atrial enlargement Right axis deviation Borderline T  abnormalities, anterior leads Prolonged QT interval Confirmed by Melvenia Motto [phone]) on 01/03/2024 12:31:49 PM  Radiology: [facility] Chest Port 1 View Result Date: 01/03/2024 EXAM: 1 VIEW(S) XRAY OF THE CHEST 01/03/2024 12:39:00 PM COMPARISON: 18 days ago. CLINICAL HISTORY: Dyspnea. FINDINGS: LUNGS AND PLEURA: Diffuse interstitial densities are noted concerning for pulmonary edema. Small loculated right pleural effusion is noted. No pneumothorax. HEART AND MEDIASTINUM: Stable cardiomegaly mildly increased. BONES AND SOFT TISSUES: No acute osseous abnormality. IMPRESSION: 1. Pulmonary edema. 2. Small loculated right pleural effusion. Electronically signed by: Lynwood Seip MD 01/03/2024 01:00 PM EST RP Workstation: [id number]     Procedures   Medications Ordered in the ED  furosemide  (LASIX ) injection 40 mg (has no administration in time range)  methylPREDNISolone  sodium succinate (SOLU-MEDROL ) 125 mg/2 mL injection 125 mg (125 mg Intravenous Given 01/03/24 1241)  albuterol  (PROVENTIL ) (2.5 MG/3ML) 0.083% nebulizer solution 2.5 mg (2.5 mg Nebulization Given 01/03/24 1241)                                    Medical Decision Making Amount and/or Complexity of Data Reviewed Labs: ordered. Radiology: ordered.  Risk Prescription drug management. Decision regarding hospitalization.   This patient presents to the ED for concern of shortness of breath, this involves an extensive number of treatment options, and is a complaint that carries with it a high risk of complications and morbidity.  The differential diagnosis includes COPD exacerbation, CHF,  pneumonia, pulmonary edema, pleural effusion, anemia, acidosis   Co morbidities / Chronic conditions that complicate the patient evaluation  CAD, COPD, GERD, CVA, CHF, PE, atrial fibrillation   Additional history obtained:  Additional history obtained from EMR External records from outside source obtained and reviewed including EMS   Lab Tests:  I Ordered, and personally interpreted labs.  The pertinent results include: Leukocytosis is present.  Hemoglobin is slightly decreased from baseline.  Kidney function and electrolytes are normal.  Blood gas shows compensated hypercarbia.  BNP is elevated consistent with CHF.   Imaging Studies ordered:  I ordered imaging studies including chest x-ray I independently visualized and interpreted imaging which showed pulmonary edema with right small pleural effusion I agree with the radiologist interpretation   Cardiac Monitoring: / EKG:  The patient was maintained on a cardiac monitor.  I personally viewed and interpreted the cardiac monitored which showed an underlying rhythm of: Sinus rhythm   Problem List / ED Course / Critical interventions / Medication management  Patient presenting for shortness of breath since last night.  Found to be hypoxic on her baseline 5 L with EMS.  Given 1 DuoNeb prior to arrival.  On arrival, patient remains hypoxic on her baseline oxygen .  Nonrebreather was placed.  This did improve her SpO2 into the high 90s on 12 L of oxygen .  Patient has diminished breath sounds on auscultation and tachypnea.  Additional nebulized breathing treatment ordered.  Solu-Medrol  ordered.  Workup initiated.  Patient's lab work notable for leukocytosis, slight drop in hemoglobin from baseline, and elevated BNP.  X-ray shows evidence of pulmonary edema.  Dose of Lasix  was ordered.  On reassessment, patient remains on 11 L of supplemental oxygen .  Breathing remains unlabored.  Patient admitted for further management. I ordered  medication including albuterol  and Solu-Medrol  for COPD; Lasix  for diuresis Reevaluation of the patient after these medicines showed that the patient improved I have reviewed the patients home medicines and have made  adjustments as needed  Social Determinants of Health:  Frequent hospitalizations  CRITICAL CARE Performed by: Bernardino Fireman   Total critical care time: 33 minutes  Critical care time was exclusive of separately billable procedures and treating other patients.  Critical care was necessary to treat or prevent imminent or life-threatening deterioration.  Critical care was time spent personally by me on the following activities: development of treatment plan with patient and/or surrogate as well as nursing, discussions with consultants, evaluation of patient's response to treatment, examination of patient, obtaining history from patient or surrogate, ordering and performing treatments and interventions, ordering and review of laboratory studies, ordering and review of radiographic studies, pulse oximetry and re-evaluation of patient's condition.       Final diagnoses:  Acute on chronic respiratory failure with hypoxia and hypercapnia (HCC)  Acute on chronic congestive heart failure, unspecified heart failure type [facility])  COPD exacerbation [facility])    ED Discharge Orders     None          Fireman Bernardino, MD 01/03/24 1550

## 2024-01-03 NOTE — Plan of Care (Signed)

## 2024-01-03 NOTE — ED Triage Notes (Signed)
 Pt bib [facility] for SOB. Pt found to be w/ sats in low 80s on her normal 5L of O2. Pt is 78% on 4L during triage. Pt placed on 15L NRB and sats now 98%. Pt started feeling bad yesterday.

## 2024-01-04 ENCOUNTER — Other Ambulatory Visit (HOSPITAL_COMMUNITY): Payer: Self-pay

## 2024-01-04 DIAGNOSIS — I5033 Acute on chronic diastolic (congestive) heart failure: Secondary | ICD-10-CM | POA: Diagnosis present

## 2024-01-04 DIAGNOSIS — J9622 Acute and chronic respiratory failure with hypercapnia: Secondary | ICD-10-CM | POA: Diagnosis present

## 2024-01-04 DIAGNOSIS — I2781 Cor pulmonale (chronic): Secondary | ICD-10-CM | POA: Diagnosis present

## 2024-01-04 DIAGNOSIS — I2510 Atherosclerotic heart disease of native coronary artery without angina pectoris: Secondary | ICD-10-CM | POA: Diagnosis present

## 2024-01-04 DIAGNOSIS — I2729 Other secondary pulmonary hypertension: Secondary | ICD-10-CM | POA: Diagnosis present

## 2024-01-04 DIAGNOSIS — E8729 Other acidosis: Secondary | ICD-10-CM | POA: Diagnosis present

## 2024-01-04 DIAGNOSIS — Z833 Family history of diabetes mellitus: Secondary | ICD-10-CM | POA: Diagnosis not present

## 2024-01-04 DIAGNOSIS — Z86711 Personal history of pulmonary embolism: Secondary | ICD-10-CM | POA: Diagnosis not present

## 2024-01-04 DIAGNOSIS — J9601 Acute respiratory failure with hypoxia: Secondary | ICD-10-CM | POA: Diagnosis present

## 2024-01-04 DIAGNOSIS — I252 Old myocardial infarction: Secondary | ICD-10-CM | POA: Diagnosis not present

## 2024-01-04 DIAGNOSIS — K219 Gastro-esophageal reflux disease without esophagitis: Secondary | ICD-10-CM | POA: Diagnosis present

## 2024-01-04 DIAGNOSIS — I4891 Unspecified atrial fibrillation: Secondary | ICD-10-CM | POA: Diagnosis present

## 2024-01-04 DIAGNOSIS — D62 Acute posthemorrhagic anemia: Secondary | ICD-10-CM | POA: Diagnosis not present

## 2024-01-04 DIAGNOSIS — E785 Hyperlipidemia, unspecified: Secondary | ICD-10-CM | POA: Diagnosis present

## 2024-01-04 DIAGNOSIS — Z8249 Family history of ischemic heart disease and other diseases of the circulatory system: Secondary | ICD-10-CM | POA: Diagnosis not present

## 2024-01-04 DIAGNOSIS — I480 Paroxysmal atrial fibrillation: Secondary | ICD-10-CM | POA: Diagnosis not present

## 2024-01-04 DIAGNOSIS — Z7901 Long term (current) use of anticoagulants: Secondary | ICD-10-CM | POA: Diagnosis not present

## 2024-01-04 DIAGNOSIS — J441 Chronic obstructive pulmonary disease with (acute) exacerbation: Secondary | ICD-10-CM | POA: Diagnosis present

## 2024-01-04 DIAGNOSIS — J9621 Acute and chronic respiratory failure with hypoxia: Secondary | ICD-10-CM | POA: Diagnosis present

## 2024-01-04 DIAGNOSIS — D6832 Hemorrhagic disorder due to extrinsic circulating anticoagulants: Secondary | ICD-10-CM | POA: Diagnosis not present

## 2024-01-04 DIAGNOSIS — F419 Anxiety disorder, unspecified: Secondary | ICD-10-CM | POA: Diagnosis present

## 2024-01-04 DIAGNOSIS — I708 Atherosclerosis of other arteries: Secondary | ICD-10-CM | POA: Diagnosis present

## 2024-01-04 DIAGNOSIS — Z66 Do not resuscitate: Secondary | ICD-10-CM | POA: Diagnosis present

## 2024-01-04 DIAGNOSIS — K921 Melena: Secondary | ICD-10-CM | POA: Diagnosis present

## 2024-01-04 DIAGNOSIS — Z952 Presence of prosthetic heart valve: Secondary | ICD-10-CM | POA: Diagnosis not present

## 2024-01-04 DIAGNOSIS — Z1152 Encounter for screening for COVID-19: Secondary | ICD-10-CM | POA: Diagnosis not present

## 2024-01-04 LAB — CBC
HCT: 28.5 % — ABNORMAL LOW (ref 36.0–46.0)
Hemoglobin: 8.2 g/dL — ABNORMAL LOW (ref 12.0–15.0)
MCH: 28.7 pg (ref 26.0–34.0)
MCHC: 28.8 g/dL — ABNORMAL LOW (ref 30.0–36.0)
MCV: 99.7 fL (ref 80.0–100.0)
Platelets: 149 K/uL — ABNORMAL LOW (ref 150–400)
RBC: 2.86 MIL/uL — ABNORMAL LOW (ref 3.87–5.11)
RDW: 15.3 % (ref 11.5–15.5)
WBC: 8.8 K/uL (ref 4.0–10.5)
nRBC: 0 % (ref 0.0–0.2)

## 2024-01-04 LAB — BASIC METABOLIC PANEL WITH GFR
Anion gap: 3 — ABNORMAL LOW (ref 5–15)
BUN: 14 mg/dL (ref 8–23)
CO2: 44 mmol/L — ABNORMAL HIGH (ref 22–32)
Calcium: 8.9 mg/dL (ref 8.9–10.3)
Chloride: 94 mmol/L — ABNORMAL LOW (ref 98–111)
Creatinine, Ser: 0.58 mg/dL (ref 0.44–1.00)
GFR, Estimated: >60 mL/min (ref >60)
Glucose, Bld: 122 mg/dL — ABNORMAL HIGH (ref 70–99)
Potassium: 4.3 mmol/L (ref 3.5–5.1)
Sodium: 141 mmol/L (ref 135–145)

## 2024-01-04 LAB — OCCULT BLOOD X 1 CARD TO LAB, STOOL: Fecal Occult Bld: POSITIVE — AB

## 2024-01-04 LAB — MAGNESIUM: Magnesium: 2.2 mg/dL (ref 1.7–2.4)

## 2024-01-04 MED ORDER — IPRATROPIUM-ALBUTEROL 0.5-2.5 (3) MG/3ML IN SOLN
3.0000 mL | Freq: Three times a day (TID) | RESPIRATORY_TRACT | Status: DC
  Administered 2024-01-05 – 2024-01-06 (×4): 3 mL via RESPIRATORY_TRACT
  Filled 2024-01-04 (×4): qty 3

## 2024-01-04 MED ORDER — OXYCODONE HCL 5 MG PO TABS
5.0000 mg | ORAL_TABLET | Freq: Four times a day (QID) | ORAL | Status: DC | PRN
Start: 2024-01-04 — End: 2024-01-06
  Administered 2024-01-04 – 2024-01-05 (×3): 5 mg via ORAL
  Filled 2024-01-04 (×3): qty 1

## 2024-01-04 MED ORDER — ALBUTEROL SULFATE (2.5 MG/3ML) 0.083% IN NEBU: 2.5000 mg | INHALATION_SOLUTION | Freq: Four times a day (QID) | RESPIRATORY_TRACT | Status: DC | PRN

## 2024-01-04 NOTE — Plan of Care (Signed)

## 2024-01-04 NOTE — Progress Notes (Signed)
 PROGRESS NOTE    Emily Rocha  FMW:[id number] DOB: 1955/05/15 DOA: 01/03/2024 PCP: Rosamond Leta NOVAK, MD   Brief Narrative:    Emily Rocha is a 68 y.o. female with medical history significant for COPD/asthma, CAD, PVD, prior TAVR, atrial fibrillation, diastolic CHF with chronic cor pulmonale, dyslipidemia, GERD, and anxiety disorder who presented to the ED with worsening of shortness of breath that began last night and continued into today.  Patient was admitted for acute on chronic hypoxemic respiratory failure secondary to acute on chronic diastolic CHF exacerbation as well as acute COPD exacerbation.  Her oxygenation status as well as respiratory condition is overall improving with diuresis as well as use of steroids and breathing treatments.  Assessment & Plan:   Principal Problem:   Acute hypoxemic respiratory failure (HCC) Active Problems:   Acute on chronic diastolic heart failure (HCC)   CAD (coronary artery disease)/Prior CABG   Dyslipidemia   Anxiety   GERD (gastroesophageal reflux disease)   S/P TAVR (transcatheter aortic valve replacement)   Atrial fibrillation (HCC)  Assessment and Plan:   Acute on chronic hypoxemic respiratory failure-multifactorial, improving Acute COPD exacerbation Acute on chronic diastolic CHF exacerbation -Wears 5 L nasal cannula at baseline and appears to be weaned to baseline at this point - Continue ongoing diuresis as well as IV steroids -Continue scheduled breathing treatments   CAD/prior CABG - Underwent recent cardiac catheterization on 11/3 - 2D echocardiogram with LVEF 55-60% - Denies any current chest pain   Anemia-stable - Appears to be chronic, continue to monitor   Atrial fibrillation - Currently rate controlled - Apixaban  for anticoagulation - Monitor on telemetry, okay for telemetry floor   Dyslipidemia - Atorvastatin    GERD - PPI   Anxiety - Alprazolam    DVT prophylaxis: Apixaban  Code Status: DNR Family  Communication: None at bedside Disposition Plan:  Status is: Observation The patient will require care spanning > 2 midnights and should be moved to inpatient because: Need for ongoing IV medications  Consultants:  None  Procedures:  None  Antimicrobials:  None  Subjective: Patient seen and evaluated today and is overall very hard of hearing, but states that she is feeling somewhat better aside from some swelling to her abdomen breathing is improved.  No acute overnight events noted and she has weaned to 5 L nasal cannula which is near her baseline.  Continues to have some ongoing tachypnea.  Objective: Vitals:   01/04/24 0806 01/04/24 0812 01/04/24 0900 01/04/24 0934  BP:   (!) 127/48   Pulse:  100 97   Resp:  (!) 37 (!) 31   Temp:      TempSrc:      SpO2: 96% 98% 92% 94%  Weight:      Height:        Intake/Output Summary (Last 24 hours) at 01/04/2024 1021 Last data filed at 01/04/2024 0806 Gross per 24 hour  Intake 300 ml  Output 1050 ml  Net -750 ml   Filed Weights   01/03/24 1211 01/03/24 1613 01/04/24 0500  Weight: 62 kg 60.5 kg 61.1 kg    Examination:  General exam: Appears calm and comfortable, hard of hearing Respiratory system: Improved to auscultation with minimal wheezing. Respiratory effort normal.  5 L nasal cannula oxygen . Cardiovascular system: S1 & S2 heard, RRR.  Gastrointestinal system: Abdomen is soft Central nervous system: Alert and awake Extremities: No edema Skin: No significant lesions noted Psychiatry: Flat affect.    Data Reviewed: I have  personally reviewed following labs and imaging studies  CBC: Recent Labs  Lab 01/03/24 1233 01/04/24 0515  WBC 16.2* 8.8  NEUTROABS 14.4*  --   HGB 8.3* 8.2*  HCT 29.3* 28.5*  MCV 100.0 99.7  PLT 167 149*   Basic Metabolic Panel: Recent Labs  Lab 01/03/24 1233 01/04/24 0515  NA 141 141  K 4.2 4.3  CL 96* 94*  CO2 42* 44*  GLUCOSE 114* 122*  BUN 13 14  CREATININE 0.57 0.58   CALCIUM  8.9 8.9  MG 2.3 2.2   GFR: Estimated Creatinine Clearance: 58.9 mL/min (by C-G formula based on SCr of 0.58 mg/dL). Liver Function Tests: Recent Labs  Lab 01/03/24 1233  AST 22  ALT 11  ALKPHOS 138*  BILITOT 0.7  PROT 6.5  ALBUMIN  3.9   No results for input(s): LIPASE, AMYLASE in the last 168 hours. No results for input(s): AMMONIA in the last 168 hours. Coagulation Profile: No results for input(s): INR, PROTIME in the last 168 hours. Cardiac Enzymes: No results for input(s): CKTOTAL, CKMB, CKMBINDEX, TROPONINI in the last 168 hours. BNP (last 3 results) Recent Labs    12/15/23 0451 12/16/23 0455 01/03/24 1233  PROBNP 3,275.0* 3,285.0* 4,206.0*   HbA1C: No results for input(s): HGBA1C in the last 72 hours. CBG: No results for input(s): GLUCAP in the last 168 hours. Lipid Profile: No results for input(s): CHOL, HDL, LDLCALC, TRIG, CHOLHDL, LDLDIRECT in the last 72 hours. Thyroid  Function Tests: No results for input(s): TSH, T4TOTAL, FREET4, T3FREE, THYROIDAB in the last 72 hours. Anemia Panel: No results for input(s): VITAMINB12, FOLATE, FERRITIN, TIBC, IRON , RETICCTPCT in the last 72 hours. Sepsis Labs: No results for input(s): PROCALCITON, LATICACIDVEN in the last 168 hours.  Recent Results (from the past 240 hours)  Resp panel by RT-PCR (RSV, Flu A&B, Covid) Anterior Nasal Swab     Status: None   Collection Time: 01/03/24 12:53 PM   Specimen: Anterior Nasal Swab  Result Value Ref Range Status   SARS Coronavirus 2 by RT PCR NEGATIVE NEGATIVE Final    Comment: (NOTE) SARS-CoV-2 target nucleic acids are NOT DETECTED.  The SARS-CoV-2 RNA is generally detectable in upper respiratory specimens during the acute phase of infection. The lowest concentration of SARS-CoV-2 viral copies this assay can detect is 138 copies/mL. A negative result does not preclude SARS-Cov-2 infection and should not be  used as the sole basis for treatment or other patient management decisions. A negative result may occur with  improper specimen collection/handling, submission of specimen other than nasopharyngeal swab, presence of viral mutation(s) within the areas targeted by this assay, and inadequate number of viral copies(<138 copies/mL). A negative result must be combined with clinical observations, patient history, and epidemiological information. The expected result is Negative.  Fact Sheet for Patients:  bloggercourse.com  Fact Sheet for Healthcare Providers:  seriousbroker.it  This test is no t yet approved or cleared by the [place]  FDA and  has been authorized for detection and/or diagnosis of SARS-CoV-2 by FDA under an Emergency Use Authorization (EUA). This EUA will remain  in effect (meaning this test can be used) for the duration of the COVID-19 declaration under Section 564(b)(1) of the Act, 21 U.S.C.section 360bbb-3(b)(1), unless the authorization is terminated  or revoked sooner.       Influenza A by PCR NEGATIVE NEGATIVE Final   Influenza B by PCR NEGATIVE NEGATIVE Final    Comment: (NOTE) The Xpert Xpress SARS-CoV-2/FLU/RSV plus assay is intended as an aid in  the diagnosis of influenza from Nasopharyngeal swab specimens and should not be used as a sole basis for treatment. Nasal washings and aspirates are unacceptable for Xpert Xpress SARS-CoV-2/FLU/RSV testing.  Fact Sheet for Patients: bloggercourse.com  Fact Sheet for Healthcare Providers: seriousbroker.it  This test is not yet approved or cleared by the [place]  FDA and has been authorized for detection and/or diagnosis of SARS-CoV-2 by FDA under an Emergency Use Authorization (EUA). This EUA will remain in effect (meaning this test can be used) for the duration of the COVID-19 declaration under Section  564(b)(1) of the Act, 21 U.S.C. section 360bbb-3(b)(1), unless the authorization is terminated or revoked.     Resp Syncytial Virus by PCR NEGATIVE NEGATIVE Final    Comment: (NOTE) Fact Sheet for Patients: bloggercourse.com  Fact Sheet for Healthcare Providers: seriousbroker.it  This test is not yet approved or cleared by the [place]  FDA and has been authorized for detection and/or diagnosis of SARS-CoV-2 by FDA under an Emergency Use Authorization (EUA). This EUA will remain in effect (meaning this test can be used) for the duration of the COVID-19 declaration under Section 564(b)(1) of the Act, 21 U.S.C. section 360bbb-3(b)(1), unless the authorization is terminated or revoked.  Performed at [facility], [address] [place], [place] [address]   MRSA Next Gen by PCR, Nasal     Status: None   Collection Time: 01/03/24  4:17 PM   Specimen: Nasal Mucosa; Nasal Swab  Result Value Ref Range Status   MRSA by PCR Next Gen NOT DETECTED NOT DETECTED Final    Comment: (NOTE) The GeneXpert MRSA Assay (FDA approved for NASAL specimens only), is one component of a comprehensive MRSA colonization surveillance program. It is not intended to diagnose MRSA infection nor to guide or monitor treatment for MRSA infections. Test performance is not FDA approved in patients less than 23 years old. Performed at [facility], [address] [place], [place] [address]          Radiology Studies: [facility] Chest [facility] 1 View Result Date: 01/03/2024 EXAM: 1 VIEW(S) XRAY OF THE CHEST 01/03/2024 12:39:00 PM COMPARISON: 18 days ago. CLINICAL HISTORY: Dyspnea. FINDINGS: LUNGS AND PLEURA: Diffuse interstitial densities are noted concerning for pulmonary edema. Small loculated right pleural effusion is noted. No pneumothorax. HEART AND MEDIASTINUM: Stable cardiomegaly mildly increased. BONES AND SOFT TISSUES: No acute osseous abnormality. IMPRESSION: 1.  Pulmonary edema. 2. Small loculated right pleural effusion. Electronically signed by: Lynwood Seip MD 01/03/2024 01:00 PM EST RP Workstation: [id number]        Scheduled Meds:  apixaban   5 mg Oral BID   atorvastatin   40 mg Oral Daily   budesonide  (PULMICORT ) nebulizer solution  0.25 mg Nebulization BID   Chlorhexidine  Gluconate Cloth  6 each Topical Daily   furosemide   40 mg Intravenous Q12H   ipratropium-albuterol   3 mL Nebulization Q6H   methylPREDNISolone  (SOLU-MEDROL ) injection  40 mg Intravenous Q12H   midodrine  5 mg Oral TID WC   pantoprazole   40 mg Oral Daily     LOS: 0 days    Time spent: 55 minutes    Sterling Ucci JONETTA Fairly, DO [facility]  If 7PM-7AM, please contact night-coverage [email or web address] 01/04/2024, 10:21 AM

## 2024-01-04 NOTE — Progress Notes (Signed)
 Patient had taken off her oxygen  accidentally while sleeping, had to take her up to 12L to get her back up.  RN made aware.  Will wean as tolerated.

## 2024-01-04 NOTE — TOC Initial Note (Signed)
 Transition of Care [facility]) - Inpatient Brief Assessment   Patient Details  Name: Emily Rocha MRN: [id number] Date of Birth: February 25, 1955  Transition of Care [facility]) CM/SW Contact:    Sharlyne Stabs, RN Phone Number: 01/04/2024, 11:09 AM   Clinical Narrative:  Patient admitted in OBS with acute hypoxemic respiratory failure. On home oxygen  at 5L baseline with [employer]. Active with [facility]. IPCM following for orders and discharge planning for 1-2 days.   Transition of Care Asessment: Insurance and Status: Insurance coverage has been reviewed Patient has primary care physician: Yes Home environment has been reviewed: Home with spouse Prior level of function:: independent Prior/Current Home Services: No current home services Social Drivers of Health Review: SDOH reviewed no interventions necessary Readmission risk has been reviewed: Yes Transition of care needs: no transition of care needs at this time    Barriers to Discharge: Continued Medical Work up   Patient Goals and CMS Choice Patient states their goals for this hospitalization and ongoing recovery are:: Return home          [facility] Agency: [facility]       Admission diagnosis:  COPD exacerbation (HCC) [J44.1] Acute hypoxemic respiratory failure (HCC) [J96.01] Acute on chronic respiratory failure with hypoxia and hypercapnia (HCC) [G03.78, J96.22] Acute on chronic congestive heart failure, unspecified heart failure type (HCC) [I50.9] Patient Active Problem List   Diagnosis Date Noted   Acute hypoxemic respiratory failure (HCC) 01/03/2024   Acute on chronic congestive heart failure (HCC) 12/18/2023   History of pulmonary embolism 12/18/2023   Right ventricular failure (HCC) 12/18/2023   Atrial fibrillation (HCC) 12/18/2023   Hyponatremia 12/17/2023   Pulmonary hypertension, unspecified (HCC) 12/16/2023   RVF (right ventricular failure) (HCC) 12/16/2023   History of transcatheter aortic valve replacement  (TAVR) 12/16/2023   Acute on chronic diastolic heart failure (HCC) 12/09/2023   Atrial fibrillation with RVR (HCC) 12/09/2023   GERD without esophagitis 12/09/2023   Chronic obstructive pulmonary disease (COPD) (HCC) 12/09/2023   Dyslipidemia 12/09/2023   Anxiety 12/09/2023   Acute on chronic respiratory failure (HCC) 08/08/2023   Chronic respiratory failure with hypoxia and hypercapnia (HCC) 03/28/2022   Acute respiratory failure with hypercapnia (HCC) 03/28/2022   Elevated LFTs 03/28/2022   Elevated troponin 03/28/2022   Hyperkalemia 03/28/2022   Sepsis with acute hypoxic respiratory failure and septic shock (HCC) 03/28/2022   Pneumonia of right lower lobe due to infectious organism 03/28/2022   History of CVA (cerebrovascular accident) 08/12/2020   COPD GOLD 3 / 08/12/2020   S/P TAVR (transcatheter aortic valve replacement) 08/12/2020   CAD S/P percutaneous coronary angioplasty 06/11/2020   Severe aortic stenosis 05/19/2020   Chronic respiratory failure with hypoxia (HCC) 11/13/2019   Chronic diastolic CHF (congestive heart failure) (HCC) 11/13/2019   Subdural hematoma (HCC) 11/12/2019   Lip laceration 11/12/2019   Hypoalbuminemia 11/12/2019   Hiatal hernia 11/02/2019   History of lung cancer- Non-Small cell/S/p Prior VATs 10/24/2019   CAD (coronary artery disease)/Prior CABG 10/24/2019   S/P lobectomy of lung 10/26/2018   S/P CABG x 3 12/14/2016   COPD exacerbation (HCC) 12/12/2016   GERD (gastroesophageal reflux disease) 12/12/2016   Carotid stenosis 12/19/2014   PCP:  Rosamond Leta NOVAK, MD Pharmacy:   [facility] - [place], [place] - [address] [place] [address] Phone: [phone] Fax: [phone]     Social Drivers of Health (SDOH) Social History: SDOH Screenings   Food Insecurity: No Food Insecurity (01/03/2024)  Housing: Low Risk  (01/03/2024)  Transportation Needs: No Transportation Needs (01/03/2024)  Utilities: Not At Risk  (01/03/2024)  Depression (PHQ2-9): Low Risk  (10/28/2020)  Social Connections: Moderately Isolated (01/03/2024)  Tobacco Use: Medium Risk (01/03/2024)

## 2024-01-04 NOTE — Progress Notes (Signed)
 At 1248: RN made DR Maree aware that patient reports 8/10 pain to chest, abdomen and back, said she felt like she pulled something when she sat up, tylenol  was given but is not helping per patient, no other prns ordered   Dr Maree ordered oxycodone  po prn, this was given and was effective

## 2024-01-04 NOTE — Plan of Care (Signed)
   Problem: Activity: Goal: Risk for activity intolerance will decrease Outcome: Progressing   Problem: Coping: Goal: Level of anxiety will decrease Outcome: Progressing

## 2024-01-05 ENCOUNTER — Telehealth: Payer: Self-pay | Admitting: Pulmonary Disease

## 2024-01-05 DIAGNOSIS — K219 Gastro-esophageal reflux disease without esophagitis: Secondary | ICD-10-CM

## 2024-01-05 DIAGNOSIS — J9601 Acute respiratory failure with hypoxia: Secondary | ICD-10-CM | POA: Diagnosis not present

## 2024-01-05 DIAGNOSIS — I480 Paroxysmal atrial fibrillation: Secondary | ICD-10-CM

## 2024-01-05 DIAGNOSIS — D62 Acute posthemorrhagic anemia: Secondary | ICD-10-CM

## 2024-01-05 LAB — BASIC METABOLIC PANEL WITH GFR
BUN: 20 mg/dL (ref 8–23)
CO2: >45 mmol/L — ABNORMAL HIGH (ref 22–32)
Calcium: 9.3 mg/dL (ref 8.9–10.3)
Chloride: 92 mmol/L — ABNORMAL LOW (ref 98–111)
Creatinine, Ser: 0.64 mg/dL (ref 0.44–1.00)
GFR, Estimated: >60 mL/min (ref >60)
Glucose, Bld: 160 mg/dL — ABNORMAL HIGH (ref 70–99)
Potassium: 4.3 mmol/L (ref 3.5–5.1)
Sodium: 140 mmol/L (ref 135–145)

## 2024-01-05 LAB — CBC
HCT: 26.5 % — ABNORMAL LOW (ref 36.0–46.0)
Hemoglobin: 7.7 g/dL — ABNORMAL LOW (ref 12.0–15.0)
MCH: 28.5 pg (ref 26.0–34.0)
MCHC: 29.1 g/dL — ABNORMAL LOW (ref 30.0–36.0)
MCV: 98.1 fL (ref 80.0–100.0)
Platelets: 171 K/uL (ref 150–400)
RBC: 2.7 MIL/uL — ABNORMAL LOW (ref 3.87–5.11)
RDW: 15.5 % (ref 11.5–15.5)
WBC: 9.8 K/uL (ref 4.0–10.5)
nRBC: 0 % (ref 0.0–0.2)

## 2024-01-05 LAB — MAGNESIUM: Magnesium: 2.2 mg/dL (ref 1.7–2.4)

## 2024-01-05 LAB — HEMOGLOBIN AND HEMATOCRIT, BLOOD
HCT: 28.2 % — ABNORMAL LOW (ref 36.0–46.0)
Hemoglobin: 8.1 g/dL — ABNORMAL LOW (ref 12.0–15.0)

## 2024-01-05 MED ORDER — PREDNISONE 20 MG PO TABS
40.0000 mg | ORAL_TABLET | Freq: Every day | ORAL | Status: DC
  Administered 2024-01-05 – 2024-01-06 (×2): 40 mg via ORAL
  Filled 2024-01-05 (×2): qty 2

## 2024-01-05 MED ORDER — PANTOPRAZOLE SODIUM 40 MG IV SOLR
40.0000 mg | Freq: Two times a day (BID) | INTRAVENOUS | Status: DC
  Administered 2024-01-05 – 2024-01-06 (×3): 40 mg via INTRAVENOUS
  Filled 2024-01-05 (×3): qty 10

## 2024-01-05 NOTE — Telephone Encounter (Signed)
 Can double book in [place] after she is discharged.

## 2024-01-05 NOTE — Consult Note (Signed)
 Gastroenterology Consult   Referring Provider: No ref. provider found Primary Care Physician:  Emily Leta NOVAK, MD Primary Gastroenterologist:  remotely seen by Dr. Golda  Patient ID: Emily Rocha; [id number]; Oct 08, 1955   Admit date: 01/03/2024  LOS: 1 day   Date of Consultation: 01/05/2024  Reason for Consultation:  worsening Hgb on Eliquis , dark stools heme positive    History of Present Illness   Emily Rocha is a 68 y.o. female with PMH of severely hard of hearing (able to communicate by increasing volume and speaking within two feet of her), severe AS s/p TAVR 07/2020, PVD, CVA, lung cancer (2020), GERD, CAD (cath 12/19/23 see below), severe COPD, history of PE in 2021, recent discharge for admission for acute on chronic diastolic heart failure with acute on chronic cor pulmonale pulmonary HTN and Afib with RVR who GI is now consulted for dark stools, drop in Hgb, hemoccult positive stools in setting of Eliquis . Patient presented to ED two days ago via EMS for worsening SOB and hypoxemia in the setting of 5L oxygen  via [place].  Recent cardiac testing: ECHO 12/10/23:  1. Left ventricular ejection fraction, by estimation, is 55 to 60%. The  left ventricle has normal function. The left ventricle has no regional  wall motion abnormalities. There is mild asymmetric left ventricular  hypertrophy. Left ventricular diastolic  function could not be evaluated. There is the interventricular septum is  flattened in systole and diastole, consistent with right ventricular  pressure and volume overload.   2. Right ventricular systolic function is moderately reduced. The right  ventricular size is moderately enlarged. There is mildly elevated  pulmonary artery systolic pressure. The estimated right ventricular  systolic pressure is 39.8 mmHg.   3. Left atrial size was severely dilated.   4. Right atrial size was severely dilated.   5. The mitral valve is degenerative. Mild mitral valve  regurgitation.  Moderate to severe mitral annular calcification.   6. Tricuspid valve regurgitation is moderate.   7. Mild paravalvular leak. The aortic valve has been repaired/replaced.  Aortic valve regurgitation is mild. Procedure Date: 08/12/2020. Aortic  valve area, by VTI measures 1.83 cm. Aortic valve mean gradient measures  7.0 mmHg. Aortic valve Vmax measures  1.82 m/s.   8. The inferior vena cava is dilated in size with <50% respiratory  variability, suggesting right atrial pressure of 15 mmHg.  Comparison(s): No significant change from prior study.   Cardiac cath 12/19/23 Assessment: 1. Severe 3v CAD with proximally occluded LAD and ostially occluded LCx (unchanged) 2. RCA stent widely patent with new total occlusion of distal RCA. There are tandem high-grade lesions in distal RCA leading into moderate-sized PDA 3. LIMA not anastomosed to the heart 4. SVG to Diagonal widely patent with backfilling of LAD 5. SVG to OM widely patent 6. High-grade (>95%) stenosis of proximal left subclavian with evidence of steal 7. Moderate pulmonary HTN with normal output and left-sided filling pressures 8. Severe (likely end-stage) chronic lung disease with chronic hypoxia and CO2 retention   Plan/Discussion:  -She does have progressive distal disease in native RCA but doubt this is responsible for her symptoms. Would continue to treat CAD medically. Main issues are end-stage lung disease and high-grade proximal left subclavian stenosis.  -Would consult Pulmonary.    GI consult:  Hgb at time of discharge on 12/20/23 was 9.7. Baseline appears to be in the 10 range. MCV normal. This admission, presented with Hgb of 8.3 and over past 48  hours, Hgb down to 7.7. Stool is heme positive. Stools reported to be dark. Anemia labs have not been done.   Eliquis  just started during recent hospitalization. Patient reports she has been on it in the past for Afib (that occurred last year when she was  significantly acutely ill with sepsis/ARDS and required intubation). She states she came off Eliquis  after a few months up until recent discharge. She states her stools have been very dark/black since her recent discharge. Denies brbpr. She denies abdominal pain, bowel concerns, dysphagia, heartburn. She states she does not eat very much. Hard to eat with her breathing issues and her ventral hernias.   She tells me she is very tired. She states she wants to go home to be with her savior Jesus. She does not want any endoscopic work up at this time. She prefers to be treated medically, and desires stopping Eliquis . She reports her family want her to do everything possible but she is tired and is ready to die.     EGD 11/2016: -normal prox and mid esophagus -scar distal esophagus -LA Grade A RE -6cm hiatal hernia -normal stomach and examined duodenum  Colonoscopy 2016: Prep satisfactory. Tortuous hepatic flexure. Ultra Slim scope was used to complete exam. Scattered diverticula at descending colon along with multiple diverticula at sigmoid colon. Normal rectal mucosa. 1 hemorrhoids below the dentate line to  Prior to Admission medications   Medication Sig Start Date End Date Taking? Authorizing Provider  albuterol  (PROVENTIL ) (2.5 MG/3ML) 0.083% nebulizer solution Take 2.5 mg by nebulization every 4 (four) hours as needed for wheezing or shortness of breath. 03/30/19  Yes [provider]  albuterol  (VENTOLIN  HFA) 108 (90 Base) MCG/ACT inhaler Inhale 1-2 puffs into the lungs every 6 (six) hours as needed for wheezing or shortness of breath.   Yes [provider]  ALPRAZolam  (XANAX ) 0.5 MG tablet Take 0.5 mg by mouth 3 (three) times daily.   Yes [provider]  apixaban  (ELIQUIS ) 5 MG TABS tablet Take 1 tablet (5 mg total) by mouth 2 (two) times daily. 12/20/23  Yes Arrien, Elidia Sieving, MD  atorvastatin  (LIPITOR ) 40 MG tablet Take 1 tablet (40 mg total) daily by  mouth. 12/27/16  Yes Gold, Wayne E, PA-C  azithromycin  (ZITHROMAX ) 250 MG tablet Take 1 tablet (250 mg total) by mouth 3 (three) times a week. 11/14/23  Yes Alghanim, Fahid, MD  diphenhydramine-acetaminophen  (TYLENOL  PM) 25-500 MG TABS tablet Take 2 tablets by mouth at bedtime.   Yes [provider]  Fluticasone-Umeclidin-Vilant (TRELEGY ELLIPTA) 100-62.5-25 MCG/ACT AEPB Inhale 1 Inhalation into the lungs daily. 11/14/23  Yes Alghanim, Fahid, MD  ipratropium-albuterol  (DUONEB) 0.5-2.5 (3) MG/3ML SOLN One in am and one at supper time 10/06/23  Yes Darlean Ozell NOVAK, MD  metoprolol  succinate (TOPROL -XL) 25 MG 24 hr tablet Take 1 tablet (25 mg total) by mouth at bedtime. 12/20/23  Yes Arrien, Mauricio Daniel, MD  midodrine (PROAMATINE) 5 MG tablet Take 1 tablet (5 mg total) by mouth 3 (three) times daily with meals. 12/20/23  Yes Arrien, Elidia Sieving, MD  pantoprazole  (PROTONIX ) 40 MG tablet Take 40 mg by mouth daily.   Yes [provider]  potassium chloride  SA (KLOR-CON  M) 20 MEQ tablet Take 1 tablet (20 mEq total) by mouth in the morning. 12/20/23  Yes Arrien, Elidia Sieving, MD  torsemide  (DEMADEX ) 20 MG tablet Take 2 tablets (40 mg total) by mouth daily. 12/20/23  Yes Arrien, Elidia Sieving, MD  magnesium  oxide (MAG-OX) 400 (  240 Mg) MG tablet Take 400 mg by mouth daily.    [provider]    Current Facility-Administered Medications  Medication Dose Route Frequency Provider Last Rate Last Admin   acetaminophen  (TYLENOL ) tablet 650 mg  650 mg Oral Q6H PRN Shah, Pratik D, DO   650 mg at 01/05/24 9168   Or   acetaminophen  (TYLENOL ) suppository 650 mg  650 mg Rectal Q6H PRN Maree, Pratik D, DO       albuterol  (PROVENTIL ) (2.5 MG/3ML) 0.083% nebulizer solution 2.5 mg  2.5 mg Nebulization Q6H PRN Maree, Pratik D, DO       ALPRAZolam  (XANAX ) tablet 0.5 mg  0.5 mg Oral TID PRN Maree, Pratik D, DO   0.5 mg at 01/05/24 9488   atorvastatin  (LIPITOR ) tablet 40 mg  40 mg Oral Daily Shah,  Pratik D, DO   40 mg at 01/05/24 9147   budesonide  (PULMICORT ) nebulizer solution 0.25 mg  0.25 mg Nebulization BID Shah, Pratik D, DO   0.25 mg at 01/05/24 9295   Chlorhexidine  Gluconate Cloth 2 % PADS 6 each  6 each Topical Daily Maree Bracken D, DO   6 each at 01/05/24 9144   furosemide  (LASIX ) injection 40 mg  40 mg Intravenous Q12H Shah, Pratik D, DO   40 mg at 01/05/24 9488   ipratropium-albuterol  (DUONEB) 0.5-2.5 (3) MG/3ML nebulizer solution 3 mL  3 mL Nebulization TID Maree Bracken D, DO   3 mL at 01/05/24 9295   midodrine  (PROAMATINE ) tablet 5 mg  5 mg Oral TID WC Maree, Pratik D, DO   5 mg at 01/05/24 9147   ondansetron  (ZOFRAN ) tablet 4 mg  4 mg Oral Q6H PRN Maree, Pratik D, DO       Or   ondansetron  (ZOFRAN ) injection 4 mg  4 mg Intravenous Q6H PRN Maree, Pratik D, DO       oxyCODONE  (Oxy IR/ROXICODONE ) immediate release tablet 5 mg  5 mg Oral Q6H PRN Maree, Pratik D, DO   5 mg at 01/04/24 1408   pantoprazole  (PROTONIX ) injection 40 mg  40 mg Intravenous Q12H Shah, Pratik D, DO   40 mg at 01/05/24 9148   predniSONE  (DELTASONE ) tablet 40 mg  40 mg Oral Q breakfast Maree Bracken D, DO   40 mg at 01/05/24 9147    Allergies as of 01/03/2024 - Review Complete 01/03/2024  Allergen Reaction Noted   Levaquin  [levofloxacin ] Swelling 10/06/2023    Past Medical History:  Diagnosis Date   Anxiety    Asthma    COPD (chronic obstructive pulmonary disease) (HCC)    Coronary artery disease    a. s/p NSTEMI in 11/2016 and required CABG with LIMA-LAD, SVG-LCx and SVG-D1 and complicated by cardiogenic shock b. cath in 05/2020 showing severe RCA stenosis and treated with orbital atherectomy and stent placement   GERD (gastroesophageal reflux disease)    History of CVA (cerebrovascular accident)    History of lung cancer 2020   Peripheral vascular disease    S/P TAVR (transcatheter aortic valve replacement) 08/12/2020   s/p TAVR with a 23 mm Edwards S3U via the TF approach by Dr. Verlin & Dr.  Lucas   Severe aortic stenosis     Past Surgical History:  Procedure Laterality Date   AORTIC ARCH ANGIOGRAPHY N/A 11/04/2017   Procedure: AORTIC ARCH ANGIOGRAPHY;  Surgeon: Harvey Carlin BRAVO, MD;  Location: [facility];  Service: Cardiovascular;  Laterality: N/A;   APPLICATION OF WOUND VAC N/A 01/06/2018  Procedure: APPLICATION OF WOUND VAC;  Surgeon: Fleeta Ochoa, Maude, MD;  Location: [facility] OR;  Service: Thoracic;  Laterality: N/A;   APPLICATION OF WOUND VAC N/A 01/13/2018   Procedure: WOUND VAC CHANGE;  Surgeon: Fleeta Ochoa Maude, MD;  Location: [facility] OR;  Service: Thoracic;  Laterality: N/A;   CARDIAC CATHETERIZATION     CAROTID-SUBCLAVIAN BYPASS GRAFT Right 12/26/2017   Procedure: AORTIC TO RIGHT COMMON CAROTID AND RIGHT SUBCLAVIAN  ARTERY  BYPASS;  Surgeon: Harvey Carlin BRAVO, MD;  Location: [facility] OR;  Service: Vascular;  Laterality: Right;   CHOLECYSTECTOMY     CLOSURE OF DIAPHRAGM  12/26/2017   Procedure: REPAIR OF DIAPHRAGM;  Surgeon: Fleeta Ochoa Maude, MD;  Location: [facility] OR;  Service: Thoracic;;   COLONOSCOPY N/A 10/03/2014   Procedure: COLONOSCOPY;  Surgeon: Claudis RAYMOND Rivet, MD;  [facility];  Service: Endoscopy;  Laterality: N/A;  730   CORONARY ARTERY BYPASS GRAFT N/A 12/14/2016   Procedure: CORONARY ARTERY BYPASS GRAFTING (CABG) x three , using left internal mammary artery and right leg greater saphenous vein harvested endoscopically;  Surgeon: Fleeta Ochoa Maude, MD;  Location: [facility] OR;  Service: Open Heart Surgery;  Laterality: N/A;   CORONARY ATHERECTOMY N/A 06/11/2020   Procedure: CORONARY ATHERECTOMY;  Surgeon: Verlin Lonni BIRCH, MD;  Location: [facility];  Service: Cardiovascular;  Laterality: N/A;   CORONARY IMAGING/OCT N/A 06/11/2020   Procedure: INTRAVASCULAR IMAGING/OCT;  Surgeon: Verlin Lonni BIRCH, MD;  Location: [facility];  Service: Cardiovascular;  Laterality: N/A;   CORONARY PRESSURE/FFR STUDY N/A 06/04/2020   Procedure: INTRAVASCULAR  PRESSURE WIRE/FFR STUDY;  Surgeon: Verlin Lonni BIRCH, MD;  Location: [facility];  Service: Cardiovascular;  Laterality: N/A;   CORONARY STENT INTERVENTION N/A 06/11/2020   Procedure: CORONARY STENT INTERVENTION;  Surgeon: Verlin Lonni BIRCH, MD;  Location: [facility];  Service: Cardiovascular;  Laterality: N/A;   ENDARTERECTOMY Right 12/06/2014   Procedure: ENDARTERECTOMY CAROTid;  Surgeon: Carlin BRAVO Harvey, MD;  Location: [facility] OR;  Service: Vascular;  Laterality: Right;   ESOPHAGOGASTRODUODENOSCOPY N/A 12/03/2016   Procedure: ESOPHAGOGASTRODUODENOSCOPY (EGD);  Surgeon: Rivet Claudis RAYMOND, MD;  [facility]: [facility];  Service: Endoscopy;  Laterality: N/A;  7:30   LEFT HEART CATH AND CORONARY ANGIOGRAPHY N/A 12/14/2016   Procedure: LEFT HEART CATH AND CORONARY ANGIOGRAPHY;  Surgeon: Verlin Lonni BIRCH, MD;  Location: [facility];  Service: Cardiovascular;  Laterality: N/A;   LOBECTOMY Right 10/26/2018   Procedure: RIGHT UPPER LOBECTOMY;  Surgeon: Shyrl Linnie KIDD, MD;  Location: [facility] OR;  Service: Thoracic;  Laterality: Right;   PERIPHERAL VASCULAR CATHETERIZATION N/A 12/05/2014   Procedure:  Carotid Arch Angiography;  Surgeon: Redell LITTIE Door, MD;  Location: [facility];  Service: Cardiovascular;  Laterality: N/A;   RIGHT/LEFT HEART CATH AND CORONARY/GRAFT ANGIOGRAPHY N/A 06/04/2020   Procedure: RIGHT/LEFT HEART CATH AND CORONARY/GRAFT ANGIOGRAPHY;  Surgeon: Verlin Lonni BIRCH, MD;  Location: [facility];  Service: Cardiovascular;  Laterality: N/A;   RIGHT/LEFT HEART CATH AND CORONARY/GRAFT ANGIOGRAPHY N/A 12/19/2023   Procedure: RIGHT/LEFT HEART CATH AND CORONARY/GRAFT ANGIOGRAPHY;  Surgeon: Cherrie Toribio SAUNDERS, MD;  Location: [facility];  Service: Cardiovascular;  Laterality: N/A;   STERNAL WOUND DEBRIDEMENT N/A 01/06/2018   Procedure: STERNAL WOUND DEBRIDEMENT;  Surgeon: Fleeta Ochoa Maude, MD;  Location: [facility] OR;  Service: Thoracic;   Laterality: N/A;   STERNOTOMY N/A 12/26/2017   Procedure: REDO STERNOTOMY;  Surgeon: Fleeta Ochoa Maude, MD;  Location: [facility]  OR;  Service: Thoracic;  Laterality: N/A;   TEE WITHOUT CARDIOVERSION N/A 12/14/2016   Procedure: TRANSESOPHAGEAL ECHOCARDIOGRAM (TEE);  Surgeon: Fleeta Ochoa, Maude, MD;  Location: [facility] OR;  Service: Open Heart Surgery;  Laterality: N/A;   TEE WITHOUT CARDIOVERSION N/A 08/12/2020   Procedure: TRANSESOPHAGEAL ECHOCARDIOGRAM (TEE);  Surgeon: Verlin Lonni BIRCH, MD;  Location: [facility] INVASIVE CV LAB;  Service: Open Heart Surgery;  Laterality: N/A;   TRANSCATHETER AORTIC VALVE REPLACEMENT, TRANSFEMORAL N/A 08/12/2020   Procedure: TRANSCATHETER AORTIC VALVE REPLACEMENT, TRANSFEMORAL;  Surgeon: Verlin Lonni BIRCH, MD;  Location: [facility];  Service: Open Heart Surgery;  Laterality: N/A;   VIDEO ASSISTED THORACOSCOPY (VATS)/WEDGE RESECTION Right 10/26/2018   Procedure: VIDEO ASSISTED THORACOSCOPY (VATS)/WEDGE RESECTION;  Surgeon: Shyrl Linnie KIDD, MD;  Location: [facility] OR;  Service: Thoracic;  Laterality: Right;   VIDEO BRONCHOSCOPY N/A 10/26/2018   Procedure: VIDEO BRONCHOSCOPY;  Surgeon: Shyrl Linnie KIDD, MD;  Location: [facility] OR;  Service: Thoracic;  Laterality: N/A;    Family History  Problem Relation Age of Onset   Heart attack Mother    Cancer Mother    Diabetes Mother    Heart disease Mother    Heart attack Father    Heart disease Father     Social History   Socioeconomic History   Marital status: Married    Spouse name: Not on file   Number of children: Not on file   Years of education: Not on file   Highest education level: Not on file  Occupational History   Not on file  Tobacco Use   Smoking status: Former    Current packs/day: 0.00    Types: Cigarettes    Start date: 03/28/1982    Quit date: 08/05/2023    Years since quitting: 0.4   Smokeless tobacco: Never   Tobacco comments:    Patient floretta again smoking August 05, 2023.   Vaping Use   Vaping  status: Former   Start date: 10/23/2013   Quit date: 10/24/2015  Substance and Sexual Activity   Alcohol  use: No    Alcohol /week: 0.0 standard drinks of alcohol    Drug use: No   Sexual activity: Not on file  Other Topics Concern   Not on file  Social History Narrative   Not on file   Social Drivers of Health   Financial Resource Strain: Not on file  Food Insecurity: No Food Insecurity (01/03/2024)   Hunger Vital Sign    Worried About Running Out of Food in the Last Year: Never true    Ran Out of Food in the Last Year: Never true  Transportation Needs: No Transportation Needs (01/03/2024)   PRAPARE - Administrator, Civil Service (Medical): No    Lack of Transportation (Non-Medical): No  Physical Activity: Not on file  Stress: Not on file  Social Connections: Moderately Isolated (01/03/2024)   Social Connection and Isolation Panel    Frequency of Communication with Friends and Family: More than three times a week    Frequency of Social Gatherings with Friends and Family: More than three times a week    Attends Religious Services: Never    Database Administrator or Organizations: No    Attends Banker Meetings: Never    Marital Status: Married  Catering Manager Violence: Not At Risk (01/03/2024)   Humiliation, Afraid, Rape, and Kick questionnaire    Fear of Current or Ex-Partner: No    Emotionally Abused: No    Physically Abused:  No    Sexually Abused: No     Review of System:   General: Negative for anorexia, weight loss, fever, chills, ++fatigue,++ weakness. Eyes: Negative for vision changes.  ENT: Negative for hoarseness, difficulty swallowing , nasal congestion. CV: Negative for chest pain, angina, palpitations, ++dyspnea on exertion, peripheral edema.  Respiratory:   ++dyspnea at rest,++ dyspnea on exertion, cough, sputum, wheezing.  GI: See history of present illness. GU:  Negative for dysuria, hematuria, urinary incontinence, urinary  frequency, nocturnal urination.  MS: Negative for joint pain, low back pain.  Derm: Negative for rash or itching.  Neuro: Negative for weakness, abnormal sensation, seizure, frequent headaches, memory loss, confusion.  Psych: Negative for anxiety, depression, suicidal ideation, hallucinations.  Endo: Negative for unusual weight change.  Heme: Negative for bruising or bleeding. Allergy: Negative for rash or hives.      Physical Examination:   Vital signs in last 24 hours: Temp:  [97.7 F (36.5 C)-98.4 F (36.9 C)] 98 F (36.7 C) (11/20 0854) Pulse Rate:  [100-110] 106 (11/20 0854) Resp:  [18-24] 18 (11/20 0519) BP: (111-141)/(42-63) 120/56 (11/20 0854) SpO2:  [90 %-98 %] 97 % (11/20 0854) Weight:  [60.1 kg] 60.1 kg (11/20 0519) Last BM Date : 01/04/24  General:chronically ill appearing thin female, hard of hearing, visibly short of breath, pausing to talk at time. No acute distress.  Head: Normocephalic, atraumatic.   Eyes: Conjunctiva pink, no icterus. Mouth: Oropharyngeal mucosa moist and pink , no lesions erythema or exudate. Neck: Supple without thyromegaly, masses, or lymphadenopathy.  Lungs: crackles scattered Heart: Regular rate and rhythm, no murmurs rubs or gallops.  Abdomen: Bowel sounds are normal, nontender, nondistended, no hepatosplenomegaly or masses, ventral hernia nontender , no rebound or guarding.   Rectal: not performed Extremities: No lower extremity edema, clubbing, deformity.  Neuro: Alert and oriented x 4 , grossly normal neurologically.  Skin: Warm and dry, no rash or jaundice.   Psych: Alert and cooperative, tearful,         Intake/Output from previous day: 11/19 0701 - 11/20 0700 In: 1020 [P.O.:1020] Out: 100 [Urine:100] Intake/Output this shift: No intake/output data recorded.  Lab Results:   CBC Recent Labs    01/03/24 1233 01/04/24 0515 01/05/24 0416  WBC 16.2* 8.8 9.8  HGB 8.3* 8.2* 7.7*  HCT 29.3* 28.5* 26.5*  MCV 100.0 99.7 98.1   PLT 167 149* 171   BMET Recent Labs    01/03/24 1233 01/04/24 0515 01/05/24 0416  NA 141 141 140  K 4.2 4.3 4.3  CL 96* 94* 92*  CO2 42* 44* >45*  GLUCOSE 114* 122* 160*  BUN 13 14 20   CREATININE 0.57 0.58 0.64  CALCIUM  8.9 8.9 9.3   LFT Recent Labs    01/03/24 1233  BILITOT 0.7  ALKPHOS 138*  AST 22  ALT 11  PROT 6.5  ALBUMIN  3.9    Lipase No results for input(s): LIPASE in the last 72 hours.  PT/INR No results for input(s): LABPROT, INR in the last 72 hours.   Hepatitis Panel No results for input(s): HEPBSAG, HCVAB, HEPAIGM, HEPBIGM in the last 72 hours.   Imaging Studies:   DG Chest Port 1 View Result Date: 01/03/2024 EXAM: 1 VIEW(S) XRAY OF THE CHEST 01/03/2024 12:39:00 PM COMPARISON: 18 days ago. CLINICAL HISTORY: Dyspnea. FINDINGS: LUNGS AND PLEURA: Diffuse interstitial densities are noted concerning for pulmonary edema. Small loculated right pleural effusion is noted. No pneumothorax. HEART AND MEDIASTINUM: Stable cardiomegaly mildly increased. BONES AND SOFT  TISSUES: No acute osseous abnormality. IMPRESSION: 1. Pulmonary edema. 2. Small loculated right pleural effusion. Electronically signed by: Lynwood Seip MD 01/03/2024 01:00 PM EST RP Workstation: [id number]   CARDIAC CATHETERIZATION Result Date: 12/19/2023   Ost LM to LM lesion is 99% stenosed.   Mid LM to Prox LAD lesion is 95% stenosed.   Prox LAD to Mid LAD lesion is 40% stenosed.   Mid LAD lesion is 30% stenosed.   Ost Cx to Prox Cx lesion is 100% stenosed.   Dist RCA lesion is 95% stenosed.   Mid RCA lesion is 30% stenosed.   Ost 2nd Diag to 2nd Diag lesion is 40% stenosed.   2nd Mrg lesion is 50% stenosed.   Ost RPDA to RPDA lesion is 90% stenosed.   RPDA lesion is 99% stenosed.   RPAV lesion is 100% stenosed.   Ost RCA to Prox RCA lesion is 40% stenosed.   Previously placed Prox RCA stent of unknown type is  widely patent. Findings: Ao = 108/43 (70) RA = 17 RV = 60/19 PA =  50/15 PCW =  13 Fick cardiac output/index = 6.0/3.6 Thermo CO/CI = 5.7/3.4 PVR = 3.0 WU Ao sat = 78% PA sat = 51% PAPi = 2.4 ABG #1   7.38/76/46/78% on 6L ABG #2   7.37/73/44/76% on 6L Sats at end of procedure 83-86% Assessment: 1. Severe 3v CAD with proximally occluded LAD and ostially occluded LCx (unchanged) 2. RCA stent widely patent with new total occlusion of distal RCA. There are tandem high-grade lesions in distal RCA leading into moderate-sized PDA 3. LIMA not anastomosed to the heart 4. SVG to Diagonal widely patent with backfilling of LAD 5. SVG to OM widely patent 6. High-grade (>95%) stenosis of proximal left subclavian with evidence of steal 7. Moderate pulmonary HTN with normal output and left-sided filling pressures 8. Severe (likely end-stage) chronic lung disease with chronic hypoxia and CO2 retention Plan/Discussion: She does have progressive distal disease in native RCA but doubt this is responsible for her symptoms. Would continue to treat CAD medically. Main issues are end-stage lung disease and high-grade proximal left subclavian stenosis. Would consult Pulmonary. Toribio Fuel, MD 12:05 PM   US  THORACENTESIS ASP PLEURAL SPACE W/IMG GUIDE Result Date: 12/16/2023 EXAM: ULTRASOUND GUIDED RIGHT THORACENTESIS 12/16/2023 01:56:48 PM TECHNIQUE: Informed consent was obtained after a detailed explanation of the procedure including risks, benefits, and alternatives. Universal protocol was performed. The right chest was prepped and draped in sterile fashion and local anesthesia was achieved with lidocaine . An thoracentesis needle sheath was advanced under ultrasound guidance into pleural effusion and thoracentesis was performed. The patient tolerated the procedure well. COMPARISON: None provided. CLINICAL HISTORY: Loculated pleural effusion, shortness of breath FINDINGS: A total of 600 cc was removed. IMPRESSION: 1. Successful ultrasound guided right thoracentesis. Electronically signed by: Ryan Salvage  MD 12/16/2023 02:40 PM EDT RP Workstation: [id number]   DG Chest 1 View Result Date: 12/16/2023 EXAM: 1 VIEW XRAY OF THE CHEST 12/16/2023 01:58:00 PM COMPARISON: 12/08/2023 CLINICAL HISTORY: SOB (shortness of breath) FINDINGS: LUNGS AND PLEURA: Decreased trace right pleural effusion. Unchanged interstitial markings. Right partial pneumonectomy postsurgical changes. No focal pulmonary opacity. No pulmonary edema. No pneumothorax. HEART AND MEDIASTINUM: Cardiomegaly. Prosthetic aortic valve noted. Aortic calcification. BONES AND SOFT TISSUES: Sternotomy wires and surgical clips noted. No acute osseous abnormality. IMPRESSION: 1. No acute cardiopulmonary findings. 2. Cardiomegaly with prosthetic aortic valve. 3. Decreased trace right pleural effusion. 4. Stable interstitial markings. Electronically signed by: Norleen Boxer MD  12/16/2023 02:35 PM EDT RP Workstation: [id number]   US  CHEST (PLEURAL EFFUSION) Result Date: 12/15/2023 CLINICAL DATA:  BILATERAL pleural effusion.  Shortness of breath. EXAM: CHEST ULTRASOUND COMPARISON:  CT angiography chest 12/14/2023 FINDINGS: Ultrasound evaluation of the chest demonstrates small LEFT and moderate RIGHT pleural effusions. IMPRESSION: Moderate RIGHT and small LEFT pleural effusions. Electronically Signed   By: Aliene Lloyd M.D.   On: 12/15/2023 15:17   CT Angio Chest Pulmonary Embolism (PE) W or WO Contrast Result Date: 12/14/2023 EXAM: CTA of the Chest with contrast for PE 12/14/2023 03:40:41 PM TECHNIQUE: CTA of the chest was performed after the administration of intravenous contrast. Multiplanar reformatted images are provided for review. MIP images are provided for review. Automated exposure control, iterative reconstruction, and/or weight based adjustment of the mA/kV was utilized to reduce the radiation dose to as low as reasonably achievable. COMPARISON: None available. CLINICAL HISTORY: Pulmonary embolism (PE) suspected, low to intermediate probability,  negative D-dimer, shortness of breath, lower extremity edema. FINDINGS: PULMONARY ARTERIES: Pulmonary arteries are adequately opacified for evaluation. No filling defect observed in the pulmonary arterial tree to suggest pulmonary embolus. Main pulmonary artery is normal in caliber. MEDIASTINUM: Prior TAVR. Cardiomegaly noted. Prior CABG. Thoracic aortic, coronary artery, and branch vessel atheromatous vascular calcifications. There is no acute abnormality of the thoracic aorta. LYMPH NODES: No mediastinal, hilar or axillary lymphadenopathy. LUNGS AND PLEURA: Prior right upper lobectomy. Wedge resection in the right middle lobe. Emphysema. Partially calcified left upper lobe anterior subpleural nodules for example on image 53 series 12, unchanged. No focal consolidation or pulmonary edema. Small left pleural effusion with passive atelectasis. Moderate loculated right pleural effusion especially along the right hemidiaphragm. No pneumothorax. UPPER ABDOMEN: Perisplenic ascites. Small to moderate sized hiatal hernia. SOFT TISSUES AND BONES: No acute bone or soft tissue abnormality. IMPRESSION: 1. No pulmonary embolism. 2. Moderate loculated right pleural effusion and small left pleural effusion with passive atelectasis. 3. Emphysema and atherosclerosis.  Prior TAVR and CABG. 4. Cardiomegaly. Electronically signed by: Ryan Salvage MD 12/14/2023 04:20 PM EDT RP Workstation: [id number]   ECHOCARDIOGRAM COMPLETE Result Date: 12/11/2023    ECHOCARDIOGRAM REPORT   Patient Name:   ZOPHIA MARRONE Date of Exam: 12/10/2023 Medical Rec #:  [id number]    Height:       64.0 in Accession #:    [id number]   Weight:       140.7 lb Date of Birth:  April 02, 1955    BSA:          1.685 m Patient Age:    68 years     BP:           90/64 mmHg Patient Gender: F            HR:           71 bpm. Exam Location:  [facility] Procedure: 2D Echo, 3D Echo, Cardiac Doppler and Color Doppler (Both Spectral            and Color Flow Doppler were  utilized during procedure). Indications:    Atrial Fibrillation I48.91  History:        Patient has prior history of Echocardiogram examinations, most                 recent 03/30/2022. CHF, CAD and Previous Myocardial Infarction,                 Prior CABG, COPD, Arrythmias:Atrial Fibrillation; Risk  Factors:Current Smoker and Dyslipidemia. Aortic Valve: 23 mm                 Edwards Ultra, stented (TAVR) valve is present in the aortic                 position. Procedure Date: 08/12/20.  Sonographer:    Aida Pizza RCS Referring Phys: [id number] VISHNU P MALLIPEDDI  Sonographer Comments: IMPRESSIONS  1. Left ventricular ejection fraction, by estimation, is 55 to 60%. The left ventricle has normal function. The left ventricle has no regional wall motion abnormalities. There is mild asymmetric left ventricular hypertrophy. Left ventricular diastolic function could not be evaluated. There is the interventricular septum is flattened in systole and diastole, consistent with right ventricular pressure and volume overload.  2. Right ventricular systolic function is moderately reduced. The right ventricular size is moderately enlarged. There is mildly elevated pulmonary artery systolic pressure. The estimated right ventricular systolic pressure is 39.8 mmHg.  3. Left atrial size was severely dilated.  4. Right atrial size was severely dilated.  5. The mitral valve is degenerative. Mild mitral valve regurgitation. Moderate to severe mitral annular calcification.  6. Tricuspid valve regurgitation is moderate.  7. Mild paravalvular leak. The aortic valve has been repaired/replaced. Aortic valve regurgitation is mild. Procedure Date: 08/12/2020. Aortic valve area, by VTI measures 1.83 cm. Aortic valve mean gradient measures 7.0 mmHg. Aortic valve Vmax measures 1.82 m/s.  8. The inferior vena cava is dilated in size with <50% respiratory variability, suggesting right atrial pressure of 15 mmHg. Comparison(s): No  significant change from prior study. FINDINGS  Left Ventricle: Left ventricular ejection fraction, by estimation, is 55 to 60%. The left ventricle has normal function. The left ventricle has no regional wall motion abnormalities. 3D ejection fraction reviewed and evaluated as part of the interpretation. Alternate measurement of EF is felt to be most reflective of LV function. The left ventricular internal cavity size was normal in size. There is mild asymmetric left ventricular hypertrophy. The interventricular septum is flattened in systole and diastole, consistent with right ventricular pressure and volume overload. Left ventricular diastolic function could not be evaluated due to atrial fibrillation. Left ventricular diastolic function could not be evaluated. Right Ventricle: The right ventricular size is moderately enlarged. No increase in right ventricular wall thickness. Right ventricular systolic function is moderately reduced. There is mildly elevated pulmonary artery systolic pressure. The tricuspid regurgitant velocity is 2.49 m/s, and with an assumed right atrial pressure of 15 mmHg, the estimated right ventricular systolic pressure is 39.8 mmHg. Left Atrium: Left atrial size was severely dilated. Right Atrium: Right atrial size was severely dilated. Pericardium: There is no evidence of pericardial effusion. Mitral Valve: The mitral valve is degenerative in appearance. Moderate to severe mitral annular calcification. Mild mitral valve regurgitation. Tricuspid Valve: The tricuspid valve is grossly normal. Tricuspid valve regurgitation is moderate . No evidence of tricuspid stenosis. Aortic Valve: Mild paravalvular leak. The aortic valve has been repaired/replaced. Aortic valve regurgitation is mild. Aortic regurgitation PHT measures 382 msec. Aortic valve mean gradient measures 7.0 mmHg. Aortic valve peak gradient measures 13.3 mmHg. Aortic valve area, by VTI measures 1.83 cm. There is a 23 mm Sapien  prosthetic, stented (TAVR) valve present in the aortic position. Pulmonic Valve: The pulmonic valve was grossly normal. Pulmonic valve regurgitation is trivial. No evidence of pulmonic stenosis. Aorta: The aortic root is normal in size and structure. Venous: The inferior vena cava is dilated in size with less  than 50% respiratory variability, suggesting right atrial pressure of 15 mmHg. IAS/Shunts: The atrial septum is grossly normal. Additional Comments: 3D was performed not requiring image post processing on an independent workstation and was indeterminate.  LEFT VENTRICLE PLAX 2D LVIDd:         4.70 cm   Diastology LVIDs:         3.40 cm   LV e' medial:    4.35 cm/s LV PW:         1.00 cm   LV E/e' medial:  36.3 LV IVS:        1.20 cm   LV e' lateral:   12.90 cm/s LVOT diam:     1.90 cm   LV E/e' lateral: 12.2 LV SV:         57 LV SV Index:   34 LVOT Area:     2.84 cm                           3D Volume EF:                          3D EF:        52 %                          LV EDV:       116 ml                          LV ESV:       55 ml                          LV SV:        60 ml RIGHT VENTRICLE RV S prime:     7.83 cm/s TAPSE (M-mode): 1.7 cm LEFT ATRIUM             Index        RIGHT ATRIUM           Index LA diam:        4.30 cm 2.55 cm/m   RA Area:     27.40 cm LA Vol (A2C):   83.4 ml 49.51 ml/m  RA Volume:   94.20 ml  55.92 ml/m LA Vol (A4C):   80.2 ml 47.61 ml/m LA Biplane Vol: 82.7 ml 49.09 ml/m  AORTIC VALVE AV Area (Vmax):    1.28 cm AV Area (Vmean):   1.48 cm AV Area (VTI):     1.83 cm AV Vmax:           182.33 cm/s AV Vmean:          116.367 cm/s AV VTI:            0.311 m AV Peak Grad:      13.3 mmHg AV Mean Grad:      7.0 mmHg LVOT Vmax:         82.47 cm/s LVOT Vmean:        60.567 cm/s LVOT VTI:          0.201 m LVOT/AV VTI ratio: 0.64 AI PHT:            382 msec  AORTA Ao Root diam: 2.90 cm MITRAL VALVE  TRICUSPID VALVE MV Area (PHT): 4.49 cm     TR Peak grad:   24.8  mmHg MV Decel Time: 169 msec     TR Vmax:        249.00 cm/s MR Peak grad: 83.2 mmHg MR Mean grad: 53.0 mmHg     SHUNTS MR Vmax:      456.00 cm/s   Systemic VTI:  0.20 m MR Vmean:     345.0 cm/s    Systemic Diam: 1.90 cm MV E velocity: 158.00 cm/s MV A velocity: 74.50 cm/s MV E/A ratio:  2.12 Darryle Decent MD Electronically signed by Darryle Decent MD Signature Date/Time: 12/11/2023/10:46:23 AM    Final    DG Chest 2 View Result Date: 12/08/2023 CLINICAL DATA:  Shortness of breath. EXAM: CHEST - 2 VIEW COMPARISON:  Chest CT dated 10/06/2023. FINDINGS: Cardiomegaly with vascular congestion and edema. Small right pleural effusion and right lung base atelectasis or infiltrate, increased since the prior radiograph. No pneumothorax. Median sternotomy wires and aortic valve repair. Atherosclerotic calcification of the aorta. No acute osseous pathology. IMPRESSION: 1. Cardiomegaly with vascular congestion and edema. 2. Small right pleural effusion and right lung base atelectasis or infiltrate. Electronically Signed   By: Vanetta Chou M.D.   On: 12/08/2023 18:37  [4 week]  Assessment:   68 yo female with PMH of severely hard of hearing (able to communicate by increasing volume and speaking within two feet of her), severe AS s/p TAVR 07/2020, PVD, CVA, lung cancer (2020), GERD, CAD (cath 12/19/23 see below), severe COPD, history of PE in 2021, recent discharge for admission for acute on chronic diastolic heart failure with acute on chronic cor pulmonale pulmonary HTN and Afib with RVR who GI is now consulted for dark stools, drop in Hgb, hemoccult positive stools in setting of Eliquis . Patient presented to ED two days ago via EMS for worsening SOB and hypoxemia in the setting of 5L oxygen  via [place].  Decline in Hgb/hemoccult positive stools/black stool: -Hgb at time of discharge 11/4 was 9.7.  -on admission 11/18 with Hgb of 8.3 -Hgb 7.7 today. At least in part hemodilution -melena documented today, patient  reports stools have been dark/black -she is on PPI daily as outpatient -started Eliquis  within the past few weeks  Likely does have some UGI bleeding in setting of Eliquis . She has known large hiatal hernia and is at risk of Cameron lesions. History of reflux esophagitis on prior EGD in 2018. Her EGD and colonoscopy are both remote. She could have gi bleeding from a number of sources including angiodysplastic lesions, ulcers, malignancy.  Long discussion with patient today. She states she is very tired. She is not interested in invasive procedures at this time. She wants to be treated with medication. She inquires about stopping her Eliquis  to allow best chance to stop any bleeding from her GI tract. She is aware that she can change her mind at any time. I did discuss with her, given her cardiopulmonary status, she may not be appropriate for endoscopy at [facility]. Patient repeatedly told me that she is ready to go home and points to the sky. States that she knows her saviour awaits.     Plan:   Continue IV pantoprazole  40mg  q12H. As outpatient would continue BID. Consider discontinuing Eliquis  and resuming ASA.  Monitor for ongoing bleeding/trend H/H. Patient is currently not interested in endoscopic evaluation.  Consider palliative consult to update goals of care.    LOS: 1 day   We  would like to thank you for the opportunity to participate in the care of DELIANA AVALOS.  Sonny RAMAN. Ezzard RIGGERS [facility] [phone] 11/20/20259:05 AM

## 2024-01-05 NOTE — Telephone Encounter (Signed)
 Copied from CRM #[id number]. Topic: Appointments - Scheduling Inquiry for Clinic >> Jan 05, 2024  2:16 PM Corean SAUNDERS wrote: Reason for CRM: Patients spouse Dexter states patient is currently hospitalized and had to cancel her appointment with Dr. Catherine tomorrow 11/21 but is kindly inquiring on if the doctor can open a slot for them at [facility] pulmonary in December as she will need to be seen soon and travelling to [place] is very difficult for them.   Could I double book one day or add to a blocked slot? Please advise and I will get patient scheduled.

## 2024-01-05 NOTE — Plan of Care (Signed)
   Problem: Education: Goal: Knowledge of General Education information will improve Description Including pain rating scale, medication(s)/side effects and non-pharmacologic comfort measures Outcome: Progressing

## 2024-01-05 NOTE — Progress Notes (Signed)
 PROGRESS NOTE    Emily Rocha  FMW:[id number] DOB: Dec 25, 1955 DOA: 01/03/2024 PCP: Rosamond Leta NOVAK, MD   Brief Narrative:    Emily Rocha is a 68 y.o. female with medical history significant for COPD/asthma, CAD, PVD, prior TAVR, atrial fibrillation, diastolic CHF with chronic cor pulmonale, dyslipidemia, GERD, and anxiety disorder who presented to the ED with worsening of shortness of breath that began last night and continued into today.  Patient was admitted for acute on chronic hypoxemic respiratory failure secondary to acute on chronic diastolic CHF exacerbation as well as acute COPD exacerbation.  Her oxygenation status as well as respiratory condition is overall improving with diuresis as well as use of steroids and breathing treatments.  She appears to be back to baseline from the standpoint, however is now having dark stools with positive occult and worsening hemoglobin levels.  GI consulted for evaluation.  Assessment & Plan:   Principal Problem:   Acute hypoxemic respiratory failure (HCC) Active Problems:   Acute on chronic diastolic heart failure (HCC)   CAD (coronary artery disease)/Prior CABG   Dyslipidemia   Anxiety   GERD (gastroesophageal reflux disease)   S/P TAVR (transcatheter aortic valve replacement)   Atrial fibrillation (HCC)  Assessment and Plan:   Acute on chronic hypoxemic respiratory failure-multifactorial, seems to have improved back to baseline Acute COPD exacerbation Acute on chronic diastolic CHF exacerbation -Wears 5 L nasal cannula now back to baseline - Hold further diuresis and switch to oral prednisone  - Breathing treatments to as needed only   CAD/prior CABG - Underwent recent cardiac catheterization on 11/3 - 2D echocardiogram with LVEF 55-60% - Denies any current chest pain   Acute blood loss anemia -Likely in the setting of GI bleed with ongoing Eliquis  use -Positive FOBT noted - Eliquis  held and patient placed on PPI IV twice  daily -Continue to monitor hemoglobin levels closely -Appreciate GI consultation   Atrial fibrillation - Currently rate controlled - Held Eliquis  for now - Monitor on telemetry, okay for telemetry floor   Dyslipidemia - Atorvastatin    GERD - PPI   Anxiety - Alprazolam    DVT prophylaxis: Apixaban  held starting 11/20 Code Status: DNR Family Communication: Husband at bedside 11/20 Disposition Plan:  Status is: Inpatient Remains inpatient appropriate because: Need for IV medications.   Consultants:  GI  Procedures:  None  Antimicrobials:  None  Subjective: Patient seen and evaluated today and is overall very hard of hearing, but states that she is feeling back to baseline from a breathing standpoint, however appears to have dark stools that are now occult positive.  Objective: Vitals:   01/05/24 0854 01/05/24 1107 01/05/24 1253 01/05/24 1319  BP: (!) 120/56 102/62  97/64  Pulse: (!) 106 94  99  Resp:      Temp: 98 F (36.7 C) 97.8 F (36.6 C)  98.6 F (37 C)  TempSrc: Oral Oral  Oral  SpO2: 97% 96% 94% 95%  Weight:      Height:        Intake/Output Summary (Last 24 hours) at 01/05/2024 1353 Last data filed at 01/05/2024 1300 Gross per 24 hour  Intake 960 ml  Output --  Net 960 ml   Filed Weights   01/03/24 1613 01/04/24 0500 01/05/24 0519  Weight: 60.5 kg 61.1 kg 60.1 kg    Examination:  General exam: Appears calm and comfortable, hard of hearing Respiratory system: Improved to auscultation with no wheezing noted.  Respiratory effort normal.  5 L  nasal cannula oxygen . Cardiovascular system: S1 & S2 heard, RRR.  Gastrointestinal system: Abdomen is soft Central nervous system: Alert and awake Extremities: No edema Skin: No significant lesions noted Psychiatry: Flat affect.    Data Reviewed: I have personally reviewed following labs and imaging studies  CBC: Recent Labs  Lab 01/03/24 1233 01/04/24 0515 01/05/24 0416  WBC 16.2* 8.8 9.8   NEUTROABS 14.4*  --   --   HGB 8.3* 8.2* 7.7*  HCT 29.3* 28.5* 26.5*  MCV 100.0 99.7 98.1  PLT 167 149* 171   Basic Metabolic Panel: Recent Labs  Lab 01/03/24 1233 01/04/24 0515 01/05/24 0416  NA 141 141 140  K 4.2 4.3 4.3  CL 96* 94* 92*  CO2 42* 44* >45*  GLUCOSE 114* 122* 160*  BUN 13 14 20   CREATININE 0.57 0.58 0.64  CALCIUM  8.9 8.9 9.3  MG 2.3 2.2 2.2   GFR: Estimated Creatinine Clearance: 58.9 mL/min (by C-G formula based on SCr of 0.64 mg/dL). Liver Function Tests: Recent Labs  Lab 01/03/24 1233  AST 22  ALT 11  ALKPHOS 138*  BILITOT 0.7  PROT 6.5  ALBUMIN  3.9   No results for input(s): LIPASE, AMYLASE in the last 168 hours. No results for input(s): AMMONIA in the last 168 hours. Coagulation Profile: No results for input(s): INR, PROTIME in the last 168 hours. Cardiac Enzymes: No results for input(s): CKTOTAL, CKMB, CKMBINDEX, TROPONINI in the last 168 hours. BNP (last 3 results) Recent Labs    12/15/23 0451 12/16/23 0455 01/03/24 1233  PROBNP 3,275.0* 3,285.0* 4,206.0*   HbA1C: No results for input(s): HGBA1C in the last 72 hours. CBG: No results for input(s): GLUCAP in the last 168 hours. Lipid Profile: No results for input(s): CHOL, HDL, LDLCALC, TRIG, CHOLHDL, LDLDIRECT in the last 72 hours. Thyroid  Function Tests: No results for input(s): TSH, T4TOTAL, FREET4, T3FREE, THYROIDAB in the last 72 hours. Anemia Panel: No results for input(s): VITAMINB12, FOLATE, FERRITIN, TIBC, IRON , RETICCTPCT in the last 72 hours. Sepsis Labs: No results for input(s): PROCALCITON, LATICACIDVEN in the last 168 hours.  Recent Results (from the past 240 hours)  Resp panel by RT-PCR (RSV, Flu A&B, Covid) Anterior Nasal Swab     Status: None   Collection Time: 01/03/24 12:53 PM   Specimen: Anterior Nasal Swab  Result Value Ref Range Status   SARS Coronavirus 2 by RT PCR NEGATIVE NEGATIVE Final     Comment: (NOTE) SARS-CoV-2 target nucleic acids are NOT DETECTED.  The SARS-CoV-2 RNA is generally detectable in upper respiratory specimens during the acute phase of infection. The lowest concentration of SARS-CoV-2 viral copies this assay can detect is 138 copies/mL. A negative result does not preclude SARS-Cov-2 infection and should not be used as the sole basis for treatment or other patient management decisions. A negative result may occur with  improper specimen collection/handling, submission of specimen other than nasopharyngeal swab, presence of viral mutation(s) within the areas targeted by this assay, and inadequate number of viral copies(<138 copies/mL). A negative result must be combined with clinical observations, patient history, and epidemiological information. The expected result is Negative.  Fact Sheet for Patients:  bloggercourse.com  Fact Sheet for Healthcare Providers:  seriousbroker.it  This test is no t yet approved or cleared by the [place]  FDA and  has been authorized for detection and/or diagnosis of SARS-CoV-2 by FDA under an Emergency Use Authorization (EUA). This EUA will remain  in effect (meaning this test can be used) for the duration  of the COVID-19 declaration under Section 564(b)(1) of the Act, 21 U.S.C.section 360bbb-3(b)(1), unless the authorization is terminated  or revoked sooner.       Influenza A by PCR NEGATIVE NEGATIVE Final   Influenza B by PCR NEGATIVE NEGATIVE Final    Comment: (NOTE) The Xpert Xpress SARS-CoV-2/FLU/RSV plus assay is intended as an aid in the diagnosis of influenza from Nasopharyngeal swab specimens and should not be used as a sole basis for treatment. Nasal washings and aspirates are unacceptable for Xpert Xpress SARS-CoV-2/FLU/RSV testing.  Fact Sheet for Patients: bloggercourse.com  Fact Sheet for Healthcare  Providers: seriousbroker.it  This test is not yet approved or cleared by the [place]  FDA and has been authorized for detection and/or diagnosis of SARS-CoV-2 by FDA under an Emergency Use Authorization (EUA). This EUA will remain in effect (meaning this test can be used) for the duration of the COVID-19 declaration under Section 564(b)(1) of the Act, 21 U.S.C. section 360bbb-3(b)(1), unless the authorization is terminated or revoked.     Resp Syncytial Virus by PCR NEGATIVE NEGATIVE Final    Comment: (NOTE) Fact Sheet for Patients: bloggercourse.com  Fact Sheet for Healthcare Providers: seriousbroker.it  This test is not yet approved or cleared by the [place]  FDA and has been authorized for detection and/or diagnosis of SARS-CoV-2 by FDA under an Emergency Use Authorization (EUA). This EUA will remain in effect (meaning this test can be used) for the duration of the COVID-19 declaration under Section 564(b)(1) of the Act, 21 U.S.C. section 360bbb-3(b)(1), unless the authorization is terminated or revoked.  Performed at [facility], [address], [place], [place] [address]   MRSA Next Gen by PCR, Nasal     Status: None   Collection Time: 01/03/24  4:17 PM   Specimen: Nasal Mucosa; Nasal Swab  Result Value Ref Range Status   MRSA by PCR Next Gen NOT DETECTED NOT DETECTED Final    Comment: (NOTE) The GeneXpert MRSA Assay (FDA approved for NASAL specimens only), is one component of a comprehensive MRSA colonization surveillance program. It is not intended to diagnose MRSA infection nor to guide or monitor treatment for MRSA infections. Test performance is not FDA approved in patients less than 72 years old. Performed at [facility], [address] [place], [place] [address]          Radiology Studies: No results found.       Scheduled Meds:  atorvastatin   40 mg Oral Daily    budesonide  (PULMICORT ) nebulizer solution  0.25 mg Nebulization BID   Chlorhexidine  Gluconate Cloth  6 each Topical Daily   furosemide   40 mg Intravenous Q12H   ipratropium-albuterol   3 mL Nebulization TID   midodrine   5 mg Oral TID WC   pantoprazole  (PROTONIX ) IV  40 mg Intravenous Q12H   predniSONE   40 mg Oral Q breakfast     LOS: 1 day    Time spent: 55 minutes    Claudie Brickhouse JONETTA Fairly, DO [facility]  If 7PM-7AM, please contact night-coverage [email or web address] 01/05/2024, 1:53 PM

## 2024-01-06 ENCOUNTER — Ambulatory Visit (HOSPITAL_BASED_OUTPATIENT_CLINIC_OR_DEPARTMENT_OTHER): Admitting: Pulmonary Disease

## 2024-01-06 LAB — CBC
HCT: 25.7 % — ABNORMAL LOW (ref 36.0–46.0)
Hemoglobin: 7.6 g/dL — ABNORMAL LOW (ref 12.0–15.0)
MCH: 29 pg (ref 26.0–34.0)
MCHC: 29.6 g/dL — ABNORMAL LOW (ref 30.0–36.0)
MCV: 98.1 fL (ref 80.0–100.0)
Platelets: 179 K/uL (ref 150–400)
RBC: 2.62 MIL/uL — ABNORMAL LOW (ref 3.87–5.11)
RDW: 15.8 % — ABNORMAL HIGH (ref 11.5–15.5)
WBC: 9.5 K/uL (ref 4.0–10.5)
nRBC: 0 % (ref 0.0–0.2)

## 2024-01-06 LAB — BASIC METABOLIC PANEL WITH GFR
BUN: 17 mg/dL (ref 8–23)
CO2: >45 mmol/L — ABNORMAL HIGH (ref 22–32)
Calcium: 9.3 mg/dL (ref 8.9–10.3)
Chloride: 90 mmol/L — ABNORMAL LOW (ref 98–111)
Creatinine, Ser: 0.5 mg/dL (ref 0.44–1.00)
GFR, Estimated: >60 mL/min (ref >60)
Glucose, Bld: 92 mg/dL (ref 70–99)
Potassium: 3.7 mmol/L (ref 3.5–5.1)
Sodium: 140 mmol/L (ref 135–145)

## 2024-01-06 LAB — MAGNESIUM: Magnesium: 2.2 mg/dL (ref 1.7–2.4)

## 2024-01-06 MED ORDER — PANTOPRAZOLE SODIUM 40 MG PO TBEC: 40.0000 mg | DELAYED_RELEASE_TABLET | Freq: Every day | ORAL | 1 refills | Status: DC

## 2024-01-06 MED ORDER — PREDNISONE 20 MG PO TABS: 40.0000 mg | ORAL_TABLET | Freq: Every day | ORAL | 0 refills | Status: AC

## 2024-01-06 MED ORDER — ASPIRIN 81 MG PO TBEC: 81.0000 mg | DELAYED_RELEASE_TABLET | Freq: Every day | ORAL | 2 refills | Status: DC

## 2024-01-06 MED ORDER — PANTOPRAZOLE SODIUM 40 MG PO TBEC: 40.0000 mg | DELAYED_RELEASE_TABLET | Freq: Two times a day (BID) | ORAL | 0 refills | Status: AC

## 2024-01-06 MED ORDER — METOPROLOL SUCCINATE ER 25 MG PO TB24
25.0000 mg | ORAL_TABLET | Freq: Every day | ORAL | Status: DC
  Administered 2024-01-06: 25 mg via ORAL
  Filled 2024-01-06: qty 1

## 2024-01-06 MED ORDER — METOPROLOL SUCCINATE ER 25 MG PO TB24: 25.0000 mg | ORAL_TABLET | Freq: Every day | ORAL | Status: DC

## 2024-01-06 NOTE — Progress Notes (Signed)
   01/06/24 1120  Assess: MEWS Score  ECG Heart Rate (!) 142 Anastacio MD Made aware of high heart rates by this RN)  Resp (!) 31  Assess: MEWS Score  MEWS Temp 0  MEWS Systolic 0  MEWS Pulse 3  MEWS RR 2  MEWS LOC 0  MEWS Score 5  MEWS Score Color Red  Assess: if the MEWS score is Yellow or Red  Were vital signs accurate and taken at a resting state? Yes  Does the patient meet 2 or more of the SIRS criteria? No  MEWS guidelines implemented  Yes, red  Treat  MEWS Interventions Considered administering scheduled or prn medications/treatments as ordered  Take Vital Signs  Increase Vital Sign Frequency  Red: Q1hr x2, continue Q4hrs until patient remains green for 12hrs  Escalate  MEWS: Escalate Red: Discuss with charge nurse and notify provider. Consider notifying RRT. If remains red for 2 hours consider need for higher level of care  Notify: Charge Nurse/RN  Name of Charge Nurse/RN Notified Arts Administrator  Provider Notification  Provider Name/Title Dr. Maree  Date Provider Notified 01/06/24  Time Provider Notified 1120  Method of Notification Call  Notification Reason Change in status (elevated RR and HR)  Provider response En route  Date of Provider Response 01/06/24  Time of Provider Response 1123  Assess: SIRS CRITERIA  SIRS Temperature  0  SIRS Respirations  1  SIRS Pulse 1  SIRS WBC 0  SIRS Score Sum  2   MD had been made aware of elevated HR earlier in the shift but patient was asymptomatic. Pt home metoprolol  restarted per order but patient triggered a muse. MD to bedside to evaluate patient. Patient HR returned to 104 with MD at bedside. Per MD if patient HR continues to improve in one hour patient can be discharged home. Patient and husband aware of plan.

## 2024-01-06 NOTE — Progress Notes (Signed)
   01/06/24 1230  Vitals  Temp 98.5 F (36.9 C)  Temp Source Oral  BP (!) 116/44  MAP (mmHg) 66  BP Location Right Arm  BP Method Automatic  Patient Position (if appropriate) Lying  Pulse Rate 97  Pulse Rate Source Dinamap  Resp 20  MEWS COLOR  MEWS Score Color Green  Oxygen  Therapy  SpO2 92 %  O2 Device Nasal Cannula  O2 Flow Rate (L/min) 5 L/min  MEWS Score  MEWS Temp 0  MEWS Systolic 0  MEWS Pulse 0  MEWS RR 0  MEWS LOC 0  MEWS Score 0   MD updated on most recent vitals. Patient is not symptomatic. Per MD okay to dc patient. Patient husband is her ride home.

## 2024-01-06 NOTE — Care Management Important Message (Signed)
 Important Message  Patient Details  Name: Emily Rocha MRN: [id number] Date of Birth: 06-29-1955   Important Message Given:  Yes - Medicare IM     Leland Staszewski L Sai Moura 01/06/2024, 10:03 AM

## 2024-01-06 NOTE — TOC Transition Note (Signed)
 Transition of Care [facility]) - Discharge Note   Patient Details  Name: Emily Rocha MRN: [id number] Date of Birth: 09-May-1955  [facility] CM/SW Contact:  Lucie Lunger, LCSWA Phone Number: 01/06/2024, 10:30 AM  Clinical Narrative:    CSW updated that pt is medically stable for D/C. MD requested OP palliative referral be made. CSW met with pt at bedside to review choices, pt prefers referral go to [facility]. CSW spoke to Austin with [facility] to provide referral. CSW updated Darleene with [facility] of plan for [facility] PT and D/C today. MD placed HH orders. TOC signing off.   Final next level of care: Home w Home Health Services Barriers to Discharge: Barriers Resolved   Patient Goals and CMS Choice Patient states their goals for this hospitalization and ongoing recovery are:: return home CMS Medicare.gov Compare Post Acute Care list provided to:: Patient Choice offered to / list presented to : Patient      Discharge Placement                       Discharge Plan and Services Additional resources added to the After Visit Summary for                            [facility] Arranged: PT [facility] Agency: [facility] Date [facility] Agency Contacted: 01/06/24   Representative spoke with at [facility] Agency: Darleene  Social Drivers of Health (SDOH) Interventions SDOH Screenings   Food Insecurity: No Food Insecurity (01/03/2024)  Housing: Low Risk  (01/03/2024)  Transportation Needs: No Transportation Needs (01/03/2024)  Utilities: Not At Risk (01/03/2024)  Depression (PHQ2-9): Low Risk  (10/28/2020)  Social Connections: Moderately Isolated (01/03/2024)  Tobacco Use: Medium Risk (01/03/2024)     Readmission Risk Interventions    12/09/2023   11:48 AM  Readmission Risk Prevention Plan  Medication Screening Complete  Transportation Screening Complete

## 2024-01-06 NOTE — Discharge Summary (Signed)
 Physician Discharge Summary  Emily Rocha FMW:[id number] DOB: [birth year] DOA: 01/03/2024  PCP: Rosamond Leta NOVAK, MD  Admit date: 01/03/2024  Discharge date: 01/06/2024  Admitted From:Home  Disposition:  Home  Recommendations for Outpatient Follow-up:  Follow up with PCP in 1-2 weeks and follow-up CBC in 5-7 days. Follow-up with outpatient palliative and discuss further goals of care as patient is not interested in any endoscopy or other aggressive procedures and appears to be focused on end-of-life care Discontinue Eliquis  and switch to aspirin  81 mg daily Remain on PPI twice daily as prescribed Continue other home medications as prior  Home Health: Outpatient palliative  Equipment/Devices: Has home 5 L nasal cannula oxygen   Discharge Condition:Stable  CODE STATUS: DNR  Diet recommendation: Heart Healthy  Brief/Interim Summary: Emily Rocha is a 68 y.o. female with medical history significant for COPD/asthma, CAD, PVD, prior TAVR, atrial fibrillation, diastolic CHF with chronic cor pulmonale, dyslipidemia, GERD, and anxiety disorder who presented to the ED with worsening of shortness of breath that began last night and continued into today.  Patient was admitted for acute on chronic hypoxemic respiratory failure secondary to acute on chronic diastolic CHF exacerbation as well as acute COPD exacerbation.  Her oxygenation status as well as respiratory condition is overall improving with diuresis as well as use of steroids and breathing treatments.  She appears to be back to baseline from the standpoint, however is now having dark stools with positive occult and worsening hemoglobin levels.  GI consulted for evaluation on 11/20 and offered endoscopy, however patient did not appear interested in any further evaluation and stated that she was tired.  It has been recommended to discontinue her oral anticoagulation and switch to low-dose aspirin  and remain on PPI twice daily.  Encouraged  follow-up of CBC to ensure hemoglobin levels have remained stable.  Discharge Diagnoses:  Principal Problem:   Acute hypoxemic respiratory failure (HCC) Active Problems:   Acute on chronic diastolic heart failure (HCC)   CAD (coronary artery disease)/Prior CABG   Dyslipidemia   Anxiety   GERD (gastroesophageal reflux disease)   S/P TAVR (transcatheter aortic valve replacement)   Atrial fibrillation (HCC)  Principal discharge diagnosis: Acute on chronic hypoxemic respiratory failure-multifactorial with acute COPD exacerbation as well as acute on chronic diastolic CHF exacerbation.  Worsening anemia likely due to GI bleed in the setting of anticoagulation use.  Discharge Instructions  Discharge Instructions     Amb Referral to Palliative Care   Complete by: As directed    Diet - low sodium heart healthy   Complete by: As directed    Increase activity slowly   Complete by: As directed       Allergies as of 01/06/2024       Reactions   Levaquin  [levofloxacin ] Swelling        Medication List     STOP taking these medications    azithromycin  250 MG tablet Commonly known as: ZITHROMAX    Eliquis  5 MG Tabs tablet Generic drug: apixaban        TAKE these medications    albuterol  108 (90 Base) MCG/ACT inhaler Commonly known as: VENTOLIN  HFA Inhale 1-2 puffs into the lungs every 6 (six) hours as needed for wheezing or shortness of breath.   albuterol  (2.5 MG/3ML) 0.083% nebulizer solution Commonly known as: PROVENTIL  Take 2.5 mg by nebulization every 4 (four) hours as needed for wheezing or shortness of breath.   ALPRAZolam  0.5 MG tablet Commonly known as: XANAX  Take 0.5 mg by  mouth 3 (three) times daily.   aspirin  EC 81 MG tablet Take 1 tablet (81 mg total) by mouth daily. Swallow whole.   atorvastatin  40 MG tablet Commonly known as: Lipitor  Take 1 tablet (40 mg total) daily by mouth.   diphenhydramine -acetaminophen  25-500 MG Tabs tablet Commonly known as:  TYLENOL  PM Take 2 tablets by mouth at bedtime.   ipratropium-albuterol  0.5-2.5 (3) MG/3ML Soln Commonly known as: DUONEB One in am and one at supper time   magnesium  oxide 400 (240 Mg) MG tablet Commonly known as: MAG-OX Take 400 mg by mouth daily.   metoprolol  succinate 25 MG 24 hr tablet Commonly known as: TOPROL -XL Take 1 tablet (25 mg total) by mouth at bedtime.   midodrine  5 MG tablet Commonly known as: PROAMATINE  Take 1 tablet (5 mg total) by mouth 3 (three) times daily with meals.   pantoprazole  40 MG tablet Commonly known as: Protonix  Take 1 tablet (40 mg total) by mouth daily. What changed: You were already taking a medication with the same name, and this prescription was added. Make sure you understand how and when to take each.   pantoprazole  40 MG tablet Commonly known as: PROTONIX  Take 1 tablet (40 mg total) by mouth 2 (two) times daily. What changed: when to take this   potassium chloride  SA 20 MEQ tablet Commonly known as: KLOR-CON  M Take 1 tablet (20 mEq total) by mouth in the morning.   predniSONE  20 MG tablet Commonly known as: DELTASONE  Take 2 tablets (40 mg total) by mouth daily with breakfast for 3 days. Start taking on: January 07, 2024   torsemide  20 MG tablet Commonly known as: DEMADEX  Take 2 tablets (40 mg total) by mouth daily.   Trelegy Ellipta  100-62.5-25 MCG/ACT Aepb Generic drug: Fluticasone-Umeclidin-Vilant Inhale 1 Inhalation into the lungs daily.        Follow-up Information     [facility]) Follow up.   Specialty: Home Health Services Why: PT will call to schedule your next home visit. Contact information: [address] [place]  [address] [phone]        Rosamond Leta NOVAK, MD. Schedule an appointment as soon as possible for a visit in 1 week(s).   Specialty: Internal Medicine Contact information: [address] [place] [address] [phone]                 Allergies  Allergen Reactions   Levaquin  [Levofloxacin ] Swelling    Consultations: GI   Procedures/Studies: DG Chest Port 1 View Result Date: 01/03/2024 EXAM: 1 VIEW(S) XRAY OF THE CHEST 01/03/2024 12:39:00 PM COMPARISON: 18 days ago. CLINICAL HISTORY: Dyspnea. FINDINGS: LUNGS AND PLEURA: Diffuse interstitial densities are noted concerning for pulmonary edema. Small loculated right pleural effusion is noted. No pneumothorax. HEART AND MEDIASTINUM: Stable cardiomegaly mildly increased. BONES AND SOFT TISSUES: No acute osseous abnormality. IMPRESSION: 1. Pulmonary edema. 2. Small loculated right pleural effusion. Electronically signed by: Lynwood Seip MD 01/03/2024 01:00 PM EST RP Workstation: [id number]   CARDIAC CATHETERIZATION Result Date: 12/19/2023   Ost LM to LM lesion is 99% stenosed.   Mid LM to Prox LAD lesion is 95% stenosed.   Prox LAD to Mid LAD lesion is 40% stenosed.   Mid LAD lesion is 30% stenosed.   Ost Cx to Prox Cx lesion is 100% stenosed.   Dist RCA lesion is 95% stenosed.   Mid RCA lesion is 30% stenosed.   Ost 2nd Diag to 2nd Diag lesion  is 40% stenosed.   2nd Mrg lesion is 50% stenosed.   Ost RPDA to RPDA lesion is 90% stenosed.   RPDA lesion is 99% stenosed.   RPAV lesion is 100% stenosed.   Ost RCA to Prox RCA lesion is 40% stenosed.   Previously placed Prox RCA stent of unknown type is  widely patent. Findings: Ao = 108/43 (70) RA = 17 RV = 60/19 PA =  50/15 PCW = 13 Fick cardiac output/index = 6.0/3.6 Thermo CO/CI = 5.7/3.4 PVR = 3.0 WU Ao sat = 78% PA sat = 51% PAPi = 2.4 ABG #1   7.38/76/46/78% on 6L ABG #2   7.37/73/44/76% on 6L Sats at end of procedure 83-86% Assessment: 1. Severe 3v CAD with proximally occluded LAD and ostially occluded LCx (unchanged) 2. RCA stent widely patent with new total occlusion of distal RCA. There are tandem high-grade lesions in distal RCA leading into moderate-sized PDA 3. LIMA not anastomosed to the heart 4. SVG to Diagonal widely patent  with backfilling of LAD 5. SVG to OM widely patent 6. High-grade (>95%) stenosis of proximal left subclavian with evidence of steal 7. Moderate pulmonary HTN with normal output and left-sided filling pressures 8. Severe (likely end-stage) chronic lung disease with chronic hypoxia and CO2 retention Plan/Discussion: She does have progressive distal disease in native RCA but doubt this is responsible for her symptoms. Would continue to treat CAD medically. Main issues are end-stage lung disease and high-grade proximal left subclavian stenosis. Would consult Pulmonary. Toribio Fuel, MD 12:05 PM   US  THORACENTESIS ASP PLEURAL SPACE W/IMG GUIDE Result Date: 12/16/2023 EXAM: ULTRASOUND GUIDED RIGHT THORACENTESIS 12/16/2023 01:56:48 PM TECHNIQUE: Informed consent was obtained after a detailed explanation of the procedure including risks, benefits, and alternatives. Universal protocol was performed. The right chest was prepped and draped in sterile fashion and local anesthesia was achieved with lidocaine . An thoracentesis needle sheath was advanced under ultrasound guidance into pleural effusion and thoracentesis was performed. The patient tolerated the procedure well. COMPARISON: None provided. CLINICAL HISTORY: Loculated pleural effusion, shortness of breath FINDINGS: A total of 600 cc was removed. IMPRESSION: 1. Successful ultrasound guided right thoracentesis. Electronically signed by: Ryan Salvage MD 12/16/2023 02:40 PM EDT RP Workstation: [id number]   DG Chest 1 View Result Date: 12/16/2023 EXAM: 1 VIEW XRAY OF THE CHEST 12/16/2023 01:58:00 PM COMPARISON: 12/08/2023 CLINICAL HISTORY: SOB (shortness of breath) FINDINGS: LUNGS AND PLEURA: Decreased trace right pleural effusion. Unchanged interstitial markings. Right partial pneumonectomy postsurgical changes. No focal pulmonary opacity. No pulmonary edema. No pneumothorax. HEART AND MEDIASTINUM: Cardiomegaly. Prosthetic aortic valve noted. Aortic  calcification. BONES AND SOFT TISSUES: Sternotomy wires and surgical clips noted. No acute osseous abnormality. IMPRESSION: 1. No acute cardiopulmonary findings. 2. Cardiomegaly with prosthetic aortic valve. 3. Decreased trace right pleural effusion. 4. Stable interstitial markings. Electronically signed by: Norleen Boxer MD 12/16/2023 02:35 PM EDT RP Workstation: [id number]   US  CHEST (PLEURAL EFFUSION) Result Date: 12/15/2023 CLINICAL DATA:  BILATERAL pleural effusion.  Shortness of breath. EXAM: CHEST ULTRASOUND COMPARISON:  CT angiography chest 12/14/2023 FINDINGS: Ultrasound evaluation of the chest demonstrates small LEFT and moderate RIGHT pleural effusions. IMPRESSION: Moderate RIGHT and small LEFT pleural effusions. Electronically Signed   By: Aliene Lloyd M.D.   On: 12/15/2023 15:17   CT Angio Chest Pulmonary Embolism (PE) W or WO Contrast Result Date: 12/14/2023 EXAM: CTA of the Chest with contrast for PE 12/14/2023 03:40:41 PM TECHNIQUE: CTA of the chest was performed after the administration of intravenous contrast.  Multiplanar reformatted images are provided for review. MIP images are provided for review. Automated exposure control, iterative reconstruction, and/or weight based adjustment of the mA/kV was utilized to reduce the radiation dose to as low as reasonably achievable. COMPARISON: None available. CLINICAL HISTORY: Pulmonary embolism (PE) suspected, low to intermediate probability, negative D-dimer, shortness of breath, lower extremity edema. FINDINGS: PULMONARY ARTERIES: Pulmonary arteries are adequately opacified for evaluation. No filling defect observed in the pulmonary arterial tree to suggest pulmonary embolus. Main pulmonary artery is normal in caliber. MEDIASTINUM: Prior TAVR. Cardiomegaly noted. Prior CABG. Thoracic aortic, coronary artery, and branch vessel atheromatous vascular calcifications. There is no acute abnormality of the thoracic aorta. LYMPH NODES: No mediastinal,  hilar or axillary lymphadenopathy. LUNGS AND PLEURA: Prior right upper lobectomy. Wedge resection in the right middle lobe. Emphysema. Partially calcified left upper lobe anterior subpleural nodules for example on image 53 series 12, unchanged. No focal consolidation or pulmonary edema. Small left pleural effusion with passive atelectasis. Moderate loculated right pleural effusion especially along the right hemidiaphragm. No pneumothorax. UPPER ABDOMEN: Perisplenic ascites. Small to moderate sized hiatal hernia. SOFT TISSUES AND BONES: No acute bone or soft tissue abnormality. IMPRESSION: 1. No pulmonary embolism. 2. Moderate loculated right pleural effusion and small left pleural effusion with passive atelectasis. 3. Emphysema and atherosclerosis.  Prior TAVR and CABG. 4. Cardiomegaly. Electronically signed by: Ryan Salvage MD 12/14/2023 04:20 PM EDT RP Workstation: [id number]   ECHOCARDIOGRAM COMPLETE Result Date: 12/11/2023    ECHOCARDIOGRAM REPORT   Patient Name:   ANELI ZARA Date of Exam: 12/10/2023 Medical Rec #:  [id number]    Height:       64.0 in Accession #:    [id number]   Weight:       140.7 lb Date of Birth:  August 29, 1955    BSA:          1.685 m Patient Age:    68 years     BP:           90/64 mmHg Patient Gender: F            HR:           71 bpm. Exam Location:  [facility] Procedure: 2D Echo, 3D Echo, Cardiac Doppler and Color Doppler (Both Spectral            and Color Flow Doppler were utilized during procedure). Indications:    Atrial Fibrillation I48.91  History:        Patient has prior history of Echocardiogram examinations, most                 recent 03/30/2022. CHF, CAD and Previous Myocardial Infarction,                 Prior CABG, COPD, Arrythmias:Atrial Fibrillation; Risk                 Factors:Current Smoker and Dyslipidemia. Aortic Valve: 23 mm                 Edwards Ultra, stented (TAVR) valve is present in the aortic                 position. Procedure Date: 08/12/20.   Sonographer:    Aida Pizza RCS Referring Phys: [id number] VISHNU P MALLIPEDDI  Sonographer Comments: IMPRESSIONS  1. Left ventricular ejection fraction, by estimation, is 55 to 60%. The left ventricle has normal function. The left ventricle has no regional wall motion abnormalities. There is mild asymmetric  left ventricular hypertrophy. Left ventricular diastolic function could not be evaluated. There is the interventricular septum is flattened in systole and diastole, consistent with right ventricular pressure and volume overload.  2. Right ventricular systolic function is moderately reduced. The right ventricular size is moderately enlarged. There is mildly elevated pulmonary artery systolic pressure. The estimated right ventricular systolic pressure is 39.8 mmHg.  3. Left atrial size was severely dilated.  4. Right atrial size was severely dilated.  5. The mitral valve is degenerative. Mild mitral valve regurgitation. Moderate to severe mitral annular calcification.  6. Tricuspid valve regurgitation is moderate.  7. Mild paravalvular leak. The aortic valve has been repaired/replaced. Aortic valve regurgitation is mild. Procedure Date: 08/12/2020. Aortic valve area, by VTI measures 1.83 cm. Aortic valve mean gradient measures 7.0 mmHg. Aortic valve Vmax measures 1.82 m/s.  8. The inferior vena cava is dilated in size with <50% respiratory variability, suggesting right atrial pressure of 15 mmHg. Comparison(s): No significant change from prior study. FINDINGS  Left Ventricle: Left ventricular ejection fraction, by estimation, is 55 to 60%. The left ventricle has normal function. The left ventricle has no regional wall motion abnormalities. 3D ejection fraction reviewed and evaluated as part of the interpretation. Alternate measurement of EF is felt to be most reflective of LV function. The left ventricular internal cavity size was normal in size. There is mild asymmetric left ventricular hypertrophy. The  interventricular septum is flattened in systole and diastole, consistent with right ventricular pressure and volume overload. Left ventricular diastolic function could not be evaluated due to atrial fibrillation. Left ventricular diastolic function could not be evaluated. Right Ventricle: The right ventricular size is moderately enlarged. No increase in right ventricular wall thickness. Right ventricular systolic function is moderately reduced. There is mildly elevated pulmonary artery systolic pressure. The tricuspid regurgitant velocity is 2.49 m/s, and with an assumed right atrial pressure of 15 mmHg, the estimated right ventricular systolic pressure is 39.8 mmHg. Left Atrium: Left atrial size was severely dilated. Right Atrium: Right atrial size was severely dilated. Pericardium: There is no evidence of pericardial effusion. Mitral Valve: The mitral valve is degenerative in appearance. Moderate to severe mitral annular calcification. Mild mitral valve regurgitation. Tricuspid Valve: The tricuspid valve is grossly normal. Tricuspid valve regurgitation is moderate . No evidence of tricuspid stenosis. Aortic Valve: Mild paravalvular leak. The aortic valve has been repaired/replaced. Aortic valve regurgitation is mild. Aortic regurgitation PHT measures 382 msec. Aortic valve mean gradient measures 7.0 mmHg. Aortic valve peak gradient measures 13.3 mmHg. Aortic valve area, by VTI measures 1.83 cm. There is a 23 mm Sapien prosthetic, stented (TAVR) valve present in the aortic position. Pulmonic Valve: The pulmonic valve was grossly normal. Pulmonic valve regurgitation is trivial. No evidence of pulmonic stenosis. Aorta: The aortic root is normal in size and structure. Venous: The inferior vena cava is dilated in size with less than 50% respiratory variability, suggesting right atrial pressure of 15 mmHg. IAS/Shunts: The atrial septum is grossly normal. Additional Comments: 3D was performed not requiring image post  processing on an independent workstation and was indeterminate.  LEFT VENTRICLE PLAX 2D LVIDd:         4.70 cm   Diastology LVIDs:         3.40 cm   LV e' medial:    4.35 cm/s LV PW:         1.00 cm   LV E/e' medial:  36.3 LV IVS:  1.20 cm   LV e' lateral:   12.90 cm/s LVOT diam:     1.90 cm   LV E/e' lateral: 12.2 LV SV:         57 LV SV Index:   34 LVOT Area:     2.84 cm                           3D Volume EF:                          3D EF:        52 %                          LV EDV:       116 ml                          LV ESV:       55 ml                          LV SV:        60 ml RIGHT VENTRICLE RV S prime:     7.83 cm/s TAPSE (M-mode): 1.7 cm LEFT ATRIUM             Index        RIGHT ATRIUM           Index LA diam:        4.30 cm 2.55 cm/m   RA Area:     27.40 cm LA Vol (A2C):   83.4 ml 49.51 ml/m  RA Volume:   94.20 ml  55.92 ml/m LA Vol (A4C):   80.2 ml 47.61 ml/m LA Biplane Vol: 82.7 ml 49.09 ml/m  AORTIC VALVE AV Area (Vmax):    1.28 cm AV Area (Vmean):   1.48 cm AV Area (VTI):     1.83 cm AV Vmax:           182.33 cm/s AV Vmean:          116.367 cm/s AV VTI:            0.311 m AV Peak Grad:      13.3 mmHg AV Mean Grad:      7.0 mmHg LVOT Vmax:         82.47 cm/s LVOT Vmean:        60.567 cm/s LVOT VTI:          0.201 m LVOT/AV VTI ratio: 0.64 AI PHT:            382 msec  AORTA Ao Root diam: 2.90 cm MITRAL VALVE                TRICUSPID VALVE MV Area (PHT): 4.49 cm     TR Peak grad:   24.8 mmHg MV Decel Time: 169 msec     TR Vmax:        249.00 cm/s MR Peak grad: 83.2 mmHg MR Mean grad: 53.0 mmHg     SHUNTS MR Vmax:      456.00 cm/s   Systemic VTI:  0.20 m MR Vmean:     345.0 cm/s    Systemic Diam: 1.90 cm MV E velocity: 158.00 cm/s MV A velocity: 74.50 cm/s MV E/A ratio:  2.12 Darryle Decent MD Electronically  signed by Darryle Decent MD Signature Date/Time: 12/11/2023/10:46:23 AM    Final    DG Chest 2 View Result Date: 12/08/2023 CLINICAL DATA:  Shortness of breath. EXAM: CHEST -  2 VIEW COMPARISON:  Chest CT dated 10/06/2023. FINDINGS: Cardiomegaly with vascular congestion and edema. Small right pleural effusion and right lung base atelectasis or infiltrate, increased since the prior radiograph. No pneumothorax. Median sternotomy wires and aortic valve repair. Atherosclerotic calcification of the aorta. No acute osseous pathology. IMPRESSION: 1. Cardiomegaly with vascular congestion and edema. 2. Small right pleural effusion and right lung base atelectasis or infiltrate. Electronically Signed   By: Vanetta Chou M.D.   On: 12/08/2023 18:37     Discharge Exam: Vitals:   01/06/24 0414 01/06/24 0841  BP: (!) 121/51 (!) 113/50  Pulse: (!) 101 (!) 102  Resp: 17   Temp: 98 F (36.7 C)   SpO2: 91% 93%   Vitals:   01/05/24 2017 01/06/24 0414 01/06/24 0500 01/06/24 0841  BP: (!) 123/45 (!) 121/51  (!) 113/50  Pulse: 86 (!) 101  (!) 102  Resp: 20 17    Temp: 98.2 F (36.8 C) 98 F (36.7 C)    TempSrc: Tympanic     SpO2: 94% 91%  93%  Weight:   59.6 kg   Height:        General: Pt is alert, awake, not in acute distress Cardiovascular: RRR, S1/S2 +, no rubs, no gallops Respiratory: CTA bilaterally, no wheezing, no rhonchi, 5 L nasal cannula Abdominal: Soft, NT, ND, bowel sounds + Extremities: no edema, no cyanosis    The results of significant diagnostics from this hospitalization (including imaging, microbiology, ancillary and laboratory) are listed below for reference.     Microbiology: Recent Results (from the past 240 hours)  Resp panel by RT-PCR (RSV, Flu A&B, Covid) Anterior Nasal Swab     Status: None   Collection Time: 01/03/24 12:53 PM   Specimen: Anterior Nasal Swab  Result Value Ref Range Status   SARS Coronavirus 2 by RT PCR NEGATIVE NEGATIVE Final    Comment: (NOTE) SARS-CoV-2 target nucleic acids are NOT DETECTED.  The SARS-CoV-2 RNA is generally detectable in upper respiratory specimens during the acute phase of infection. The  lowest concentration of SARS-CoV-2 viral copies this assay can detect is 138 copies/mL. A negative result does not preclude SARS-Cov-2 infection and should not be used as the sole basis for treatment or other patient management decisions. A negative result may occur with  improper specimen collection/handling, submission of specimen other than nasopharyngeal swab, presence of viral mutation(s) within the areas targeted by this assay, and inadequate number of viral copies(<138 copies/mL). A negative result must be combined with clinical observations, patient history, and epidemiological information. The expected result is Negative.  Fact Sheet for Patients:  bloggercourse.com  Fact Sheet for Healthcare Providers:  seriousbroker.it  This test is no t yet approved or cleared by the [place]  FDA and  has been authorized for detection and/or diagnosis of SARS-CoV-2 by FDA under an Emergency Use Authorization (EUA). This EUA will remain  in effect (meaning this test can be used) for the duration of the COVID-19 declaration under Section 564(b)(1) of the Act, 21 U.S.C.section 360bbb-3(b)(1), unless the authorization is terminated  or revoked sooner.       Influenza A by PCR NEGATIVE NEGATIVE Final   Influenza B by PCR NEGATIVE NEGATIVE Final    Comment: (NOTE) The Xpert Xpress SARS-CoV-2/FLU/RSV plus assay is intended as an  aid in the diagnosis of influenza from Nasopharyngeal swab specimens and should not be used as a sole basis for treatment. Nasal washings and aspirates are unacceptable for Xpert Xpress SARS-CoV-2/FLU/RSV testing.  Fact Sheet for Patients: bloggercourse.com  Fact Sheet for Healthcare Providers: seriousbroker.it  This test is not yet approved or cleared by the [place]  FDA and has been authorized for detection and/or diagnosis of SARS-CoV-2 by FDA under  an Emergency Use Authorization (EUA). This EUA will remain in effect (meaning this test can be used) for the duration of the COVID-19 declaration under Section 564(b)(1) of the Act, 21 U.S.C. section 360bbb-3(b)(1), unless the authorization is terminated or revoked.     Resp Syncytial Virus by PCR NEGATIVE NEGATIVE Final    Comment: (NOTE) Fact Sheet for Patients: bloggercourse.com  Fact Sheet for Healthcare Providers: seriousbroker.it  This test is not yet approved or cleared by the [place]  FDA and has been authorized for detection and/or diagnosis of SARS-CoV-2 by FDA under an Emergency Use Authorization (EUA). This EUA will remain in effect (meaning this test can be used) for the duration of the COVID-19 declaration under Section 564(b)(1) of the Act, 21 U.S.C. section 360bbb-3(b)(1), unless the authorization is terminated or revoked.  Performed at [facility], [address], [place], [place] [address]   MRSA Next Gen by PCR, Nasal     Status: None   Collection Time: 01/03/24  4:17 PM   Specimen: Nasal Mucosa; Nasal Swab  Result Value Ref Range Status   MRSA by PCR Next Gen NOT DETECTED NOT DETECTED Final    Comment: (NOTE) The GeneXpert MRSA Assay (FDA approved for NASAL specimens only), is one component of a comprehensive MRSA colonization surveillance program. It is not intended to diagnose MRSA infection nor to guide or monitor treatment for MRSA infections. Test performance is not FDA approved in patients less than 110 years old. Performed at [facility], [address], [place], [place] [address]      Labs: BNP (last 3 results) Recent Labs    08/08/23 0953 12/17/23 0229  BNP 548.0* 966.8*   Basic Metabolic Panel: Recent Labs  Lab 01/03/24 1233 01/04/24 0515 01/05/24 0416 01/06/24 0434  NA 141 141 140 140  K 4.2 4.3 4.3 3.7  CL 96* 94* 92* 90*  CO2 42* 44* >45* >45*  GLUCOSE 114* 122* 160* 92   BUN 13 14 20 17   CREATININE 0.57 0.58 0.64 0.50  CALCIUM  8.9 8.9 9.3 9.3  MG 2.3 2.2 2.2 2.2   Liver Function Tests: Recent Labs  Lab 01/03/24 1233  AST 22  ALT 11  ALKPHOS 138*  BILITOT 0.7  PROT 6.5  ALBUMIN  3.9   No results for input(s): LIPASE, AMYLASE in the last 168 hours. No results for input(s): AMMONIA in the last 168 hours. CBC: Recent Labs  Lab 01/03/24 1233 01/04/24 0515 01/05/24 0416 01/05/24 1719 01/06/24 0434  WBC 16.2* 8.8 9.8  --  9.5  NEUTROABS 14.4*  --   --   --   --   HGB 8.3* 8.2* 7.7* 8.1* 7.6*  HCT 29.3* 28.5* 26.5* 28.2* 25.7*  MCV 100.0 99.7 98.1  --  98.1  PLT 167 149* 171  --  179   Cardiac Enzymes: No results for input(s): CKTOTAL, CKMB, CKMBINDEX, TROPONINI in the last 168 hours. BNP: Invalid input(s): POCBNP CBG: No results for input(s): GLUCAP in the last 168 hours. D-Dimer No results for input(s): DDIMER in the last 72 hours. Hgb A1c No  results for input(s): HGBA1C in the last 72 hours. Lipid Profile No results for input(s): CHOL, HDL, LDLCALC, TRIG, CHOLHDL, LDLDIRECT in the last 72 hours. Thyroid  function studies No results for input(s): TSH, T4TOTAL, T3FREE, THYROIDAB in the last 72 hours.  Invalid input(s): FREET3 Anemia work up No results for input(s): VITAMINB12, FOLATE, FERRITIN, TIBC, IRON , RETICCTPCT in the last 72 hours. Urinalysis    Component Value Date/Time   COLORURINE STRAW (A) 05/17/2022 2057   APPEARANCEUR CLEAR 05/17/2022 2057   LABSPEC 1.009 05/17/2022 2057   PHURINE 6.0 05/17/2022 2057   GLUCOSEU NEGATIVE 05/17/2022 2057   HGBUR NEGATIVE 05/17/2022 2057   BILIRUBINUR NEGATIVE 05/17/2022 2057   KETONESUR NEGATIVE 05/17/2022 2057   PROTEINUR NEGATIVE 05/17/2022 2057   UROBILINOGEN 0.2 12/04/2014 1737   NITRITE NEGATIVE 05/17/2022 2057   LEUKOCYTESUR NEGATIVE 05/17/2022 2057   Sepsis Labs Recent Labs  Lab 01/03/24 1233 01/04/24 0515  01/05/24 0416 01/06/24 0434  WBC 16.2* 8.8 9.8 9.5   Microbiology Recent Results (from the past 240 hours)  Resp panel by RT-PCR (RSV, Flu A&B, Covid) Anterior Nasal Swab     Status: None   Collection Time: 01/03/24 12:53 PM   Specimen: Anterior Nasal Swab  Result Value Ref Range Status   SARS Coronavirus 2 by RT PCR NEGATIVE NEGATIVE Final    Comment: (NOTE) SARS-CoV-2 target nucleic acids are NOT DETECTED.  The SARS-CoV-2 RNA is generally detectable in upper respiratory specimens during the acute phase of infection. The lowest concentration of SARS-CoV-2 viral copies this assay can detect is 138 copies/mL. A negative result does not preclude SARS-Cov-2 infection and should not be used as the sole basis for treatment or other patient management decisions. A negative result may occur with  improper specimen collection/handling, submission of specimen other than nasopharyngeal swab, presence of viral mutation(s) within the areas targeted by this assay, and inadequate number of viral copies(<138 copies/mL). A negative result must be combined with clinical observations, patient history, and epidemiological information. The expected result is Negative.  Fact Sheet for Patients:  bloggercourse.com  Fact Sheet for Healthcare Providers:  seriousbroker.it  This test is no t yet approved or cleared by the [place]  FDA and  has been authorized for detection and/or diagnosis of SARS-CoV-2 by FDA under an Emergency Use Authorization (EUA). This EUA will remain  in effect (meaning this test can be used) for the duration of the COVID-19 declaration under Section 564(b)(1) of the Act, 21 U.S.C.section 360bbb-3(b)(1), unless the authorization is terminated  or revoked sooner.       Influenza A by PCR NEGATIVE NEGATIVE Final   Influenza B by PCR NEGATIVE NEGATIVE Final    Comment: (NOTE) The Xpert Xpress SARS-CoV-2/FLU/RSV plus assay  is intended as an aid in the diagnosis of influenza from Nasopharyngeal swab specimens and should not be used as a sole basis for treatment. Nasal washings and aspirates are unacceptable for Xpert Xpress SARS-CoV-2/FLU/RSV testing.  Fact Sheet for Patients: bloggercourse.com  Fact Sheet for Healthcare Providers: seriousbroker.it  This test is not yet approved or cleared by the [place]  FDA and has been authorized for detection and/or diagnosis of SARS-CoV-2 by FDA under an Emergency Use Authorization (EUA). This EUA will remain in effect (meaning this test can be used) for the duration of the COVID-19 declaration under Section 564(b)(1) of the Act, 21 U.S.C. section 360bbb-3(b)(1), unless the authorization is terminated or revoked.     Resp Syncytial Virus by PCR NEGATIVE NEGATIVE Final    Comment: (  NOTE) Fact Sheet for Patients: bloggercourse.com  Fact Sheet for Healthcare Providers: seriousbroker.it  This test is not yet approved or cleared by the [place]  FDA and has been authorized for detection and/or diagnosis of SARS-CoV-2 by FDA under an Emergency Use Authorization (EUA). This EUA will remain in effect (meaning this test can be used) for the duration of the COVID-19 declaration under Section 564(b)(1) of the Act, 21 U.S.C. section 360bbb-3(b)(1), unless the authorization is terminated or revoked.  Performed at [facility], [address], [place], [place] [address]   MRSA Next Gen by PCR, Nasal     Status: None   Collection Time: 01/03/24  4:17 PM   Specimen: Nasal Mucosa; Nasal Swab  Result Value Ref Range Status   MRSA by PCR Next Gen NOT DETECTED NOT DETECTED Final    Comment: (NOTE) The GeneXpert MRSA Assay (FDA approved for NASAL specimens only), is one component of a comprehensive MRSA colonization surveillance program. It is not intended to diagnose  MRSA infection nor to guide or monitor treatment for MRSA infections. Test performance is not FDA approved in patients less than 17 years old. Performed at [facility], [address] [place], [place] [address]      Time coordinating discharge: 35 minutes  SIGNED:   Adron JONETTA Fairly, DO [facility] 01/06/2024, 9:51 AM  If 7PM-7AM, please contact night-coverage [email or web address]

## 2024-01-07 ENCOUNTER — Emergency Department (HOSPITAL_COMMUNITY)

## 2024-01-07 ENCOUNTER — Other Ambulatory Visit: Payer: Self-pay

## 2024-01-07 ENCOUNTER — Inpatient Hospital Stay (HOSPITAL_COMMUNITY)
Admission: EM | Admit: 2024-01-07 | Discharge: 2024-01-10 | DRG: 186 | Disposition: A | Discharge status: Hospice | Attending: Internal Medicine | Admitting: Internal Medicine

## 2024-01-07 DIAGNOSIS — J9622 Acute and chronic respiratory failure with hypercapnia: Secondary | ICD-10-CM | POA: Diagnosis not present

## 2024-01-07 DIAGNOSIS — I739 Peripheral vascular disease, unspecified: Secondary | ICD-10-CM | POA: Diagnosis present

## 2024-01-07 DIAGNOSIS — I2781 Cor pulmonale (chronic): Secondary | ICD-10-CM | POA: Diagnosis present

## 2024-01-07 DIAGNOSIS — Z66 Do not resuscitate: Secondary | ICD-10-CM | POA: Diagnosis present

## 2024-01-07 DIAGNOSIS — Z952 Presence of prosthetic heart valve: Secondary | ICD-10-CM | POA: Diagnosis not present

## 2024-01-07 DIAGNOSIS — D62 Acute posthemorrhagic anemia: Secondary | ICD-10-CM | POA: Diagnosis present

## 2024-01-07 DIAGNOSIS — J90 Pleural effusion, not elsewhere classified: Secondary | ICD-10-CM | POA: Diagnosis not present

## 2024-01-07 DIAGNOSIS — R0602 Shortness of breath: Secondary | ICD-10-CM | POA: Diagnosis not present

## 2024-01-07 DIAGNOSIS — R062 Wheezing: Secondary | ICD-10-CM | POA: Diagnosis not present

## 2024-01-07 DIAGNOSIS — K219 Gastro-esophageal reflux disease without esophagitis: Secondary | ICD-10-CM | POA: Diagnosis present

## 2024-01-07 DIAGNOSIS — Z7189 Other specified counseling: Secondary | ICD-10-CM | POA: Diagnosis not present

## 2024-01-07 DIAGNOSIS — Z1152 Encounter for screening for COVID-19: Secondary | ICD-10-CM | POA: Diagnosis not present

## 2024-01-07 DIAGNOSIS — E785 Hyperlipidemia, unspecified: Secondary | ICD-10-CM | POA: Diagnosis present

## 2024-01-07 DIAGNOSIS — I2510 Atherosclerotic heart disease of native coronary artery without angina pectoris: Secondary | ICD-10-CM | POA: Diagnosis present

## 2024-01-07 DIAGNOSIS — Z8673 Personal history of transient ischemic attack (TIA), and cerebral infarction without residual deficits: Secondary | ICD-10-CM | POA: Diagnosis not present

## 2024-01-07 DIAGNOSIS — I959 Hypotension, unspecified: Secondary | ICD-10-CM | POA: Diagnosis not present

## 2024-01-07 DIAGNOSIS — I5033 Acute on chronic diastolic (congestive) heart failure: Secondary | ICD-10-CM | POA: Diagnosis not present

## 2024-01-07 DIAGNOSIS — J9621 Acute and chronic respiratory failure with hypoxia: Secondary | ICD-10-CM | POA: Diagnosis not present

## 2024-01-07 DIAGNOSIS — J9602 Acute respiratory failure with hypercapnia: Secondary | ICD-10-CM | POA: Diagnosis present

## 2024-01-07 DIAGNOSIS — R0689 Other abnormalities of breathing: Secondary | ICD-10-CM | POA: Diagnosis not present

## 2024-01-07 DIAGNOSIS — Z515 Encounter for palliative care: Secondary | ICD-10-CM | POA: Diagnosis not present

## 2024-01-07 DIAGNOSIS — Z8249 Family history of ischemic heart disease and other diseases of the circulatory system: Secondary | ICD-10-CM | POA: Diagnosis not present

## 2024-01-07 DIAGNOSIS — Z881 Allergy status to other antibiotic agents status: Secondary | ICD-10-CM | POA: Diagnosis not present

## 2024-01-07 DIAGNOSIS — R0902 Hypoxemia: Secondary | ICD-10-CM | POA: Diagnosis not present

## 2024-01-07 DIAGNOSIS — I700 Atherosclerosis of aorta: Secondary | ICD-10-CM | POA: Diagnosis not present

## 2024-01-07 DIAGNOSIS — F419 Anxiety disorder, unspecified: Secondary | ICD-10-CM | POA: Diagnosis present

## 2024-01-07 DIAGNOSIS — J4489 Other specified chronic obstructive pulmonary disease: Secondary | ICD-10-CM | POA: Diagnosis present

## 2024-01-07 DIAGNOSIS — I252 Old myocardial infarction: Secondary | ICD-10-CM | POA: Diagnosis not present

## 2024-01-07 DIAGNOSIS — Z87891 Personal history of nicotine dependence: Secondary | ICD-10-CM | POA: Diagnosis not present

## 2024-01-07 DIAGNOSIS — Z833 Family history of diabetes mellitus: Secondary | ICD-10-CM | POA: Diagnosis not present

## 2024-01-07 DIAGNOSIS — I4891 Unspecified atrial fibrillation: Secondary | ICD-10-CM | POA: Diagnosis present

## 2024-01-07 DIAGNOSIS — R918 Other nonspecific abnormal finding of lung field: Secondary | ICD-10-CM | POA: Diagnosis not present

## 2024-01-07 DIAGNOSIS — Z7982 Long term (current) use of aspirin: Secondary | ICD-10-CM | POA: Diagnosis not present

## 2024-01-07 LAB — CBC WITH DIFFERENTIAL/PLATELET
Abs Immature Granulocytes: 0.03 K/uL (ref 0.00–0.07)
Basophils Absolute: 0 K/uL (ref 0.0–0.1)
Basophils Relative: 0 %
Eosinophils Absolute: 0 K/uL (ref 0.0–0.5)
Eosinophils Relative: 0 %
HCT: 29.2 % — ABNORMAL LOW (ref 36.0–46.0)
Hemoglobin: 8.4 g/dL — ABNORMAL LOW (ref 12.0–15.0)
Immature Granulocytes: 0 %
Lymphocytes Relative: 15 %
Lymphs Abs: 1.4 K/uL (ref 0.7–4.0)
MCH: 28.5 pg (ref 26.0–34.0)
MCHC: 28.8 g/dL — ABNORMAL LOW (ref 30.0–36.0)
MCV: 99 fL (ref 80.0–100.0)
Monocytes Absolute: 1.5 K/uL — ABNORMAL HIGH (ref 0.1–1.0)
Monocytes Relative: 16 %
Neutro Abs: 6.5 K/uL (ref 1.7–7.7)
Neutrophils Relative %: 69 %
Platelets: 215 K/uL (ref 150–400)
RBC: 2.95 MIL/uL — ABNORMAL LOW (ref 3.87–5.11)
RDW: 15.3 % (ref 11.5–15.5)
WBC: 9.3 K/uL (ref 4.0–10.5)
nRBC: 0 % (ref 0.0–0.2)

## 2024-01-07 LAB — COMPREHENSIVE METABOLIC PANEL WITH GFR
ALT: 19 U/L (ref 0–44)
AST: 35 U/L (ref 15–41)
Albumin: 4 g/dL (ref 3.5–5.0)
Alkaline Phosphatase: 122 U/L (ref 38–126)
BUN: 24 mg/dL — ABNORMAL HIGH (ref 8–23)
CO2: >45 mmol/L — ABNORMAL HIGH (ref 22–32)
Calcium: 9.8 mg/dL (ref 8.9–10.3)
Chloride: 89 mmol/L — ABNORMAL LOW (ref 98–111)
Creatinine, Ser: 0.62 mg/dL (ref 0.44–1.00)
GFR, Estimated: >60 mL/min (ref >60)
Glucose, Bld: 77 mg/dL (ref 70–99)
Potassium: 4.3 mmol/L (ref 3.5–5.1)
Sodium: 139 mmol/L (ref 135–145)
Total Bilirubin: 1.3 mg/dL — ABNORMAL HIGH (ref 0.0–1.2)
Total Protein: 6.8 g/dL (ref 6.5–8.1)

## 2024-01-07 LAB — TROPONIN T, HIGH SENSITIVITY
Troponin T High Sensitivity: 87 ng/L — ABNORMAL HIGH (ref 0–19)
Troponin T High Sensitivity: 90 ng/L — ABNORMAL HIGH (ref 0–19)

## 2024-01-07 LAB — PRO BRAIN NATRIURETIC PEPTIDE: Pro Brain Natriuretic Peptide: 13727 pg/mL — ABNORMAL HIGH (ref <300.0)

## 2024-01-07 MED ORDER — PANTOPRAZOLE SODIUM 40 MG PO TBEC
40.0000 mg | DELAYED_RELEASE_TABLET | Freq: Two times a day (BID) | ORAL | Status: DC
  Administered 2024-01-07 – 2024-01-10 (×6): 40 mg via ORAL
  Filled 2024-01-07 (×6): qty 1

## 2024-01-07 MED ORDER — METOPROLOL SUCCINATE ER 25 MG PO TB24
25.0000 mg | ORAL_TABLET | Freq: Every day | ORAL | Status: DC
  Administered 2024-01-07 – 2024-01-08 (×2): 25 mg via ORAL
  Filled 2024-01-07 (×2): qty 1

## 2024-01-07 MED ORDER — MAGNESIUM OXIDE -MG SUPPLEMENT 400 (240 MG) MG PO TABS
400.0000 mg | ORAL_TABLET | Freq: Every day | ORAL | Status: DC
  Administered 2024-01-07 – 2024-01-10 (×4): 400 mg via ORAL
  Filled 2024-01-07 (×4): qty 1

## 2024-01-07 MED ORDER — DIPHENHYDRAMINE-APAP (SLEEP) 25-500 MG PO TABS: 2.0000 | ORAL_TABLET | Freq: Every day | ORAL | Status: DC

## 2024-01-07 MED ORDER — DIPHENHYDRAMINE HCL 25 MG PO CAPS: 25.0000 mg | ORAL_CAPSULE | Freq: Every evening | ORAL | Status: DC | PRN

## 2024-01-07 MED ORDER — POTASSIUM CHLORIDE CRYS ER 20 MEQ PO TBCR
20.0000 meq | EXTENDED_RELEASE_TABLET | Freq: Every day | ORAL | Status: DC
  Administered 2024-01-08 – 2024-01-10 (×3): 20 meq via ORAL
  Filled 2024-01-07 (×3): qty 1

## 2024-01-07 MED ORDER — IPRATROPIUM-ALBUTEROL 0.5-2.5 (3) MG/3ML IN SOLN
RESPIRATORY_TRACT | Status: AC
Start: 2024-01-07 — End: 2024-01-07
  Filled 2024-01-07: qty 3

## 2024-01-07 MED ORDER — ONDANSETRON HCL 4 MG/2ML IJ SOLN: 4.0000 mg | Freq: Four times a day (QID) | INTRAMUSCULAR | Status: DC | PRN

## 2024-01-07 MED ORDER — IPRATROPIUM-ALBUTEROL 0.5-2.5 (3) MG/3ML IN SOLN: 3.0000 mL | RESPIRATORY_TRACT | Status: DC | PRN

## 2024-01-07 MED ORDER — OXYCODONE HCL 5 MG PO TABS
5.0000 mg | ORAL_TABLET | Freq: Four times a day (QID) | ORAL | Status: DC | PRN
  Administered 2024-01-07 – 2024-01-10 (×6): 5 mg via ORAL
  Filled 2024-01-07 (×6): qty 1

## 2024-01-07 MED ORDER — CHLORHEXIDINE GLUCONATE CLOTH 2 % EX PADS
6.0000 | MEDICATED_PAD | Freq: Every day | CUTANEOUS | Status: DC
  Administered 2024-01-08 – 2024-01-10 (×3): 6 via TOPICAL

## 2024-01-07 MED ORDER — MIDODRINE HCL 5 MG PO TABS
5.0000 mg | ORAL_TABLET | Freq: Three times a day (TID) | ORAL | Status: DC
  Administered 2024-01-07 – 2024-01-10 (×9): 5 mg via ORAL
  Filled 2024-01-07 (×10): qty 1

## 2024-01-07 MED ORDER — TORSEMIDE 20 MG PO TABS
40.0000 mg | ORAL_TABLET | Freq: Every day | ORAL | Status: DC
  Administered 2024-01-07 – 2024-01-08 (×2): 40 mg via ORAL
  Filled 2024-01-07 (×2): qty 2

## 2024-01-07 MED ORDER — HYDRALAZINE HCL 20 MG/ML IJ SOLN: 10.0000 mg | INTRAMUSCULAR | Status: DC | PRN

## 2024-01-07 MED ORDER — IPRATROPIUM-ALBUTEROL 0.5-2.5 (3) MG/3ML IN SOLN: 3.0000 mL | Freq: Once | RESPIRATORY_TRACT | Status: DC

## 2024-01-07 MED ORDER — ALBUTEROL SULFATE (2.5 MG/3ML) 0.083% IN NEBU
10.0000 mg | INHALATION_SOLUTION | RESPIRATORY_TRACT | Status: DC
  Administered 2024-01-07: 10 mg via RESPIRATORY_TRACT
  Filled 2024-01-07: qty 12

## 2024-01-07 MED ORDER — ACETAMINOPHEN 325 MG PO TABS
650.0000 mg | ORAL_TABLET | Freq: Four times a day (QID) | ORAL | Status: DC | PRN
  Administered 2024-01-08 – 2024-01-09 (×2): 650 mg via ORAL
  Filled 2024-01-07 (×2): qty 2

## 2024-01-07 MED ORDER — ALBUTEROL SULFATE (2.5 MG/3ML) 0.083% IN NEBU: 2.5000 mg | INHALATION_SOLUTION | Freq: Once | RESPIRATORY_TRACT | Status: DC

## 2024-01-07 MED ORDER — ALBUTEROL SULFATE (2.5 MG/3ML) 0.083% IN NEBU
INHALATION_SOLUTION | RESPIRATORY_TRACT | Status: AC
  Filled 2024-01-07: qty 3

## 2024-01-07 MED ORDER — ATORVASTATIN CALCIUM 40 MG PO TABS
40.0000 mg | ORAL_TABLET | Freq: Every day | ORAL | Status: DC
  Administered 2024-01-07 – 2024-01-10 (×4): 40 mg via ORAL
  Filled 2024-01-07 (×4): qty 1

## 2024-01-07 MED ORDER — METHYLPREDNISOLONE SODIUM SUCC 125 MG IJ SOLR
125.0000 mg | Freq: Once | INTRAMUSCULAR | Status: AC
  Administered 2024-01-07: 125 mg via INTRAVENOUS
  Filled 2024-01-07: qty 2

## 2024-01-07 MED ORDER — ALPRAZOLAM 0.5 MG PO TABS
0.5000 mg | ORAL_TABLET | Freq: Three times a day (TID) | ORAL | Status: DC | PRN
  Administered 2024-01-07 – 2024-01-10 (×7): 0.5 mg via ORAL
  Filled 2024-01-07 (×7): qty 1

## 2024-01-07 MED ORDER — ONDANSETRON HCL 4 MG PO TABS: 4.0000 mg | ORAL_TABLET | Freq: Four times a day (QID) | ORAL | Status: DC | PRN

## 2024-01-07 MED ORDER — ASPIRIN 81 MG PO TBEC
81.0000 mg | DELAYED_RELEASE_TABLET | Freq: Every day | ORAL | Status: DC
  Administered 2024-01-07 – 2024-01-10 (×4): 81 mg via ORAL
  Filled 2024-01-07 (×4): qty 1

## 2024-01-07 MED ORDER — PREDNISONE 20 MG PO TABS
40.0000 mg | ORAL_TABLET | Freq: Every day | ORAL | Status: DC
  Administered 2024-01-07 – 2024-01-10 (×4): 40 mg via ORAL
  Filled 2024-01-07 (×4): qty 2

## 2024-01-07 MED ORDER — ACETAMINOPHEN 650 MG RE SUPP: 650.0000 mg | Freq: Four times a day (QID) | RECTAL | Status: DC | PRN

## 2024-01-07 NOTE — ED Triage Notes (Signed)
 Pt arrived via [facility] from home, was discharged from her recent admission for respiratory failure yesterday. C/o shob, on 4 L via [place] baseline--per EMS, pt was placed on 5 L via [place] and SpO2 was in the 80s so they placed pt on a non-rebreather at 15 L and SpO2 came up to 100%. RT at bedside and placed pt on HFNC at 6 L and SpO2 99%.

## 2024-01-07 NOTE — Plan of Care (Signed)
  Problem: Education: Goal: Knowledge of General Education information will improve Description: Including pain rating scale, medication(s)/side effects and non-pharmacologic comfort measures Outcome: Progressing   Problem: Clinical Measurements: Goal: Ability to maintain clinical measurements within normal limits will improve Outcome: Progressing   Problem: Clinical Measurements: Goal: Respiratory complications will improve Outcome: Progressing   Problem: Clinical Measurements: Goal: Diagnostic test results will improve Outcome: Progressing   Problem: Activity: Goal: Risk for activity intolerance will decrease Outcome: Progressing

## 2024-01-07 NOTE — H&P (Addendum)
 History and Physical    Emily Rocha FMW:[id number] DOB: 02-Aug-1955 DOA: 01/07/2024  PCP: Rosamond Leta NOVAK, MD   Patient coming from: Home  Chief Complaint: Dyspnea  HPI: Emily Rocha is a 68 y.o. female with medical history significant for COPD/asthma, CAD, PVD, prior TAVR, atrial fibrillation, diastolic CHF with chronic cor pulmonale, dyslipidemia, GERD, and anxiety disorder who presented to the ED with worsening of shortness of breath who was just discharged on 11/21 after admission for acute on chronic hypoxemic/hypercapnic respiratory failure secondary to acute on chronic diastolic CHF exacerbation as well as acute COPD exacerbation.  At that time, she was eager for discharge and was noted to also have concerns for GI bleed, but refused endoscopic evaluation and anticoagulation was discontinued.  She states that she began having shortness of breath again when she woke up and was wearing her nasal cannula oxygen  at that time.  She was noted to have desaturations into the mid 80th percentile range on her home 4 L nasal cannula.  She denies any fevers or chills or cough or chest pain.   ED Course: Vital signs with some mild tachypnea noted and she is currently receiving breathing treatment.  She has no increased work of breathing.  She is noted to have loculated pleural effusion which appears to be moderate in size compared to prior and is agreeable to thoracentesis on Monday.  Hemoglobin levels appear stable.  Review of Systems: Reviewed as noted above, otherwise negative.  Past Medical History:  Diagnosis Date   Anxiety    Asthma    COPD (chronic obstructive pulmonary disease) (HCC)    Coronary artery disease    a. s/p NSTEMI in 11/2016 and required CABG with LIMA-LAD, SVG-LCx and SVG-D1 and complicated by cardiogenic shock b. cath in 05/2020 showing severe RCA stenosis and treated with orbital atherectomy and stent placement   GERD (gastroesophageal reflux disease)    History of CVA  (cerebrovascular accident)    History of lung cancer 2020   Peripheral vascular disease    S/P TAVR (transcatheter aortic valve replacement) 08/12/2020   s/p TAVR with a 23 mm Edwards S3U via the TF approach by Dr. Verlin & Dr. Lucas   Severe aortic stenosis     Past Surgical History:  Procedure Laterality Date   AORTIC ARCH ANGIOGRAPHY N/A 11/04/2017   Procedure: AORTIC ARCH ANGIOGRAPHY;  Surgeon: Harvey Carlin BRAVO, MD;  Location: [facility];  Service: Cardiovascular;  Laterality: N/A;   APPLICATION OF WOUND VAC N/A 01/06/2018   Procedure: APPLICATION OF WOUND VAC;  Surgeon: Fleeta Hanford Coy, MD;  Location: [facility] OR;  Service: Thoracic;  Laterality: N/A;   APPLICATION OF WOUND VAC N/A 01/13/2018   Procedure: WOUND VAC CHANGE;  Surgeon: Fleeta Hanford Coy, MD;  Location: [facility] OR;  Service: Thoracic;  Laterality: N/A;   CARDIAC CATHETERIZATION     CAROTID-SUBCLAVIAN BYPASS GRAFT Right 12/26/2017   Procedure: AORTIC TO RIGHT COMMON CAROTID AND RIGHT SUBCLAVIAN  ARTERY  BYPASS;  Surgeon: Harvey Carlin BRAVO, MD;  Location: [facility] OR;  Service: Vascular;  Laterality: Right;   CHOLECYSTECTOMY     CLOSURE OF DIAPHRAGM  12/26/2017   Procedure: REPAIR OF DIAPHRAGM;  Surgeon: Fleeta Hanford Coy, MD;  Location: [facility] OR;  Service: Thoracic;;   COLONOSCOPY N/A 10/03/2014   Procedure: COLONOSCOPY;  Surgeon: Claudis RAYMOND Rivet, MD;  [facility];  Service: Endoscopy;  Laterality: N/A;  730   CORONARY ARTERY BYPASS GRAFT N/A 12/14/2016   Procedure:  CORONARY ARTERY BYPASS GRAFTING (CABG) x three , using left internal mammary artery and right leg greater saphenous vein harvested endoscopically;  Surgeon: Fleeta Hanford Coy, MD;  Location: [facility] OR;  Service: Open Heart Surgery;  Laterality: N/A;   CORONARY ATHERECTOMY N/A 06/11/2020   Procedure: CORONARY ATHERECTOMY;  Surgeon: Verlin Lonni BIRCH, MD;  Location: [facility];  Service: Cardiovascular;  Laterality: N/A;   CORONARY IMAGING/OCT N/A  06/11/2020   Procedure: INTRAVASCULAR IMAGING/OCT;  Surgeon: Verlin Lonni BIRCH, MD;  Location: [facility];  Service: Cardiovascular;  Laterality: N/A;   CORONARY PRESSURE/FFR STUDY N/A 06/04/2020   Procedure: INTRAVASCULAR PRESSURE WIRE/FFR STUDY;  Surgeon: Verlin Lonni BIRCH, MD;  Location: [facility];  Service: Cardiovascular;  Laterality: N/A;   CORONARY STENT INTERVENTION N/A 06/11/2020   Procedure: CORONARY STENT INTERVENTION;  Surgeon: Verlin Lonni BIRCH, MD;  [facility]: [facility];  Service: Cardiovascular;  Laterality: N/A;   ENDARTERECTOMY Right 12/06/2014   Procedure: ENDARTERECTOMY CAROTid;  Surgeon: Carlin FORBES Haddock, MD;  Location: [facility] OR;  Service: Vascular;  Laterality: Right;   ESOPHAGOGASTRODUODENOSCOPY N/A 12/03/2016   Procedure: ESOPHAGOGASTRODUODENOSCOPY (EGD);  Surgeon: Golda Claudis PENNER, MD;  [facility];  Service: Endoscopy;  Laterality: N/A;  7:30   LEFT HEART CATH AND CORONARY ANGIOGRAPHY N/A 12/14/2016   Procedure: LEFT HEART CATH AND CORONARY ANGIOGRAPHY;  Surgeon: Verlin Lonni BIRCH, MD;  [facility]: [facility];  Service: Cardiovascular;  Laterality: N/A;   LOBECTOMY Right 10/26/2018   Procedure: RIGHT UPPER LOBECTOMY;  Surgeon: Shyrl Linnie KIDD, MD;  Location: [facility] OR;  Service: Thoracic;  Laterality: Right;   PERIPHERAL VASCULAR CATHETERIZATION N/A 12/05/2014   Procedure:  Carotid Arch Angiography;  Surgeon: Redell LITTIE Door, MD;  Location: [facility];  Service: Cardiovascular;  Laterality: N/A;   RIGHT/LEFT HEART CATH AND CORONARY/GRAFT ANGIOGRAPHY N/A 06/04/2020   Procedure: RIGHT/LEFT HEART CATH AND CORONARY/GRAFT ANGIOGRAPHY;  Surgeon: Verlin Lonni BIRCH, MD;  [facility];  Service: Cardiovascular;  Laterality: N/A;   RIGHT/LEFT HEART CATH AND CORONARY/GRAFT ANGIOGRAPHY N/A 12/19/2023   Procedure: RIGHT/LEFT HEART CATH AND CORONARY/GRAFT ANGIOGRAPHY;  Surgeon: Cherrie Toribio SAUNDERS, MD;   [facility]: [facility];  Service: Cardiovascular;  Laterality: N/A;   STERNAL WOUND DEBRIDEMENT N/A 01/06/2018   Procedure: STERNAL WOUND DEBRIDEMENT;  Surgeon: Fleeta Hanford Coy, MD;  Location: [facility] OR;  Service: Thoracic;  Laterality: N/A;   STERNOTOMY N/A 12/26/2017   Procedure: REDO STERNOTOMY;  Surgeon: Fleeta Hanford, Coy, MD;  Location: [facility] OR;  Service: Thoracic;  Laterality: N/A;   TEE WITHOUT CARDIOVERSION N/A 12/14/2016   Procedure: TRANSESOPHAGEAL ECHOCARDIOGRAM (TEE);  Surgeon: Fleeta Hanford, Coy, MD;  Location[facility] OR;  Service: Open Heart Surgery;  Laterality: N/A;   TEE WITHOUT CARDIOVERSION N/A 08/12/2020   Procedure: TRANSESOPHAGEAL ECHOCARDIOGRAM (TEE);  Surgeon: Verlin Lonni BIRCH, MD;  Location: [facility] INVASIVE CV LAB;  Service: Open Heart Surgery;  Laterality: N/A;   TRANSCATHETER AORTIC VALVE REPLACEMENT, TRANSFEMORAL N/A 08/12/2020   Procedure: TRANSCATHETER AORTIC VALVE REPLACEMENT, TRANSFEMORAL;  Surgeon: Verlin Lonni BIRCH, MD;  Location: [facility];  Service: Open Heart Surgery;  Laterality: N/A;   VIDEO ASSISTED THORACOSCOPY (VATS)/WEDGE RESECTION Right 10/26/2018   Procedure: VIDEO ASSISTED THORACOSCOPY (VATS)/WEDGE RESECTION;  Surgeon: Shyrl Linnie KIDD, MD;  Location: [facility];  Service: Thoracic;  Laterality: Right;   VIDEO BRONCHOSCOPY N/A 10/26/2018   Procedure: VIDEO BRONCHOSCOPY;  Surgeon: Shyrl Linnie KIDD, MD;  Location: [facility] OR;  Service: Thoracic;  Laterality: N/A;     reports that she quit smoking about 5 months ago. Her smoking use included cigarettes. She started smoking about 41 years ago. She has never used smokeless tobacco. She reports that she does not drink alcohol  and does not use drugs.  Allergies  Allergen Reactions   Levaquin  [Levofloxacin ] Swelling    Family History  Problem Relation Age of Onset   Heart attack Mother    Cancer Mother    Diabetes Mother    Heart disease Mother    Heart attack Father    Heart disease Father      Prior to Admission medications   Medication Sig Start Date End Date Taking? Authorizing Provider  albuterol  (PROVENTIL ) (2.5 MG/3ML) 0.083% nebulizer solution Take 2.5 mg by nebulization every 4 (four) hours as needed for wheezing or shortness of breath. 03/30/19   [provider]  albuterol  (VENTOLIN  HFA) 108 (90 Base) MCG/ACT inhaler Inhale 1-2 puffs into the lungs every 6 (six) hours as needed for wheezing or shortness of breath.    [provider]  ALPRAZolam  (XANAX ) 0.5 MG tablet Take 0.5 mg by mouth 3 (three) times daily.    [provider]  aspirin  EC 81 MG tablet Take 1 tablet (81 mg total) by mouth daily. Swallow whole. 01/06/24 01/05/25  Maree, Clarivel Callaway D, DO  atorvastatin  (LIPITOR ) 40 MG tablet Take 1 tablet (40 mg total) daily by mouth. 12/27/16   Gold, Lemond E, PA-C  diphenhydramine -acetaminophen  (TYLENOL  PM) 25-500 MG TABS tablet Take 2 tablets by mouth at bedtime.    [provider]  Fluticasone-Umeclidin-Vilant (TRELEGY ELLIPTA ) 100-62.5-25 MCG/ACT AEPB Inhale 1 Inhalation into the lungs daily. 11/14/23   Alghanim, Paula, MD  ipratropium-albuterol  (DUONEB) 0.5-2.5 (3) MG/3ML SOLN One in am and one at supper time 10/06/23   Darlean Ozell NOVAK, MD  magnesium  oxide (MAG-OX) 400 (240 Mg) MG tablet Take 400 mg by mouth daily.    [provider]  metoprolol  succinate (TOPROL -XL) 25 MG 24 hr tablet Take 1 tablet (25 mg total) by mouth at bedtime. 12/20/23   Arrien, Elidia Sieving, MD  midodrine  (PROAMATINE ) 5 MG tablet Take 1 tablet (5 mg total) by mouth 3 (three) times daily with meals. 12/20/23   Arrien, Elidia Sieving, MD  pantoprazole  (PROTONIX ) 40 MG tablet Take 1 tablet (40 mg total) by mouth 2 (two) times daily. 01/06/24 02/05/24  Maree, Zamyra Allensworth D, DO  potassium chloride  SA (KLOR-CON  M) 20 MEQ tablet Take 1 tablet (20 mEq total) by mouth in the morning. 12/20/23   Arrien, Mauricio Daniel, MD  predniSONE  (DELTASONE ) 20 MG tablet Take 2 tablets (40  mg total) by mouth daily with breakfast for 3 days. 01/07/24 01/10/24  Maree, Jaquann Guarisco D, DO  torsemide  (DEMADEX ) 20 MG tablet Take 2 tablets (40 mg total) by mouth daily. 12/20/23   Noralee Elidia Sieving, MD    Physical Exam: Vitals:   01/07/24 0916 01/07/24 0925 01/07/24 1100  BP:  (!) 124/55 131/60  Pulse:  82 71  Resp:  (!) 22 (!) 25  Temp:  98 F (36.7 C)   TempSrc:  Oral   SpO2: 99% 98% 98%    Constitutional: NAD, calm, comfortable Vitals:   01/07/24 0916 01/07/24 0925 01/07/24 1100  BP:  (!) 124/55 131/60  Pulse:  82 71  Resp:  (!) 22 (!) 25  Temp:  98 F (36.7 C)   TempSrc:  Oral   SpO2: 99% 98% 98%   Eyes: lids and conjunctivae normal Neck:  normal, supple Respiratory: clear to auscultation bilaterally. Normal respiratory effort. No accessory muscle use.  Currently on breathing treatment. Cardiovascular: Regular rate and rhythm, no murmurs. Abdomen: no tenderness, no distention. Bowel sounds positive.  Musculoskeletal:  No edema. Skin: no rashes, lesions, ulcers.  Psychiatric: Flat affect  Labs on Admission: I have personally reviewed following labs and imaging studies  CBC: Recent Labs  Lab 01/03/24 1233 01/04/24 0515 01/05/24 0416 01/05/24 1719 01/06/24 0434 01/07/24 1019  WBC 16.2* 8.8 9.8  --  9.5 9.3  NEUTROABS 14.4*  --   --   --   --  6.5  HGB 8.3* 8.2* 7.7* 8.1* 7.6* 8.4*  HCT 29.3* 28.5* 26.5* 28.2* 25.7* 29.2*  MCV 100.0 99.7 98.1  --  98.1 99.0  PLT 167 149* 171  --  179 215   Basic Metabolic Panel: Recent Labs  Lab 01/03/24 1233 01/04/24 0515 01/05/24 0416 01/06/24 0434 01/07/24 1019  NA 141 141 140 140 139  K 4.2 4.3 4.3 3.7 4.3  CL 96* 94* 92* 90* 89*  CO2 42* 44* >45* >45* >45*  GLUCOSE 114* 122* 160* 92 77  BUN 13 14 20 17  24*  CREATININE 0.57 0.58 0.64 0.50 0.62  CALCIUM  8.9 8.9 9.3 9.3 9.8  MG 2.3 2.2 2.2 2.2  --    GFR: Estimated Creatinine Clearance: 58.9 mL/min (by C-G formula based on SCr of 0.62 mg/dL). Liver  Function Tests: Recent Labs  Lab 01/03/24 1233 01/07/24 1019  AST 22 35  ALT 11 19  ALKPHOS 138* 122  BILITOT 0.7 1.3*  PROT 6.5 6.8  ALBUMIN  3.9 4.0   No results for input(s): LIPASE, AMYLASE in the last 168 hours. No results for input(s): AMMONIA in the last 168 hours. Coagulation Profile: No results for input(s): INR, PROTIME in the last 168 hours. Cardiac Enzymes: No results for input(s): CKTOTAL, CKMB, CKMBINDEX, TROPONINI in the last 168 hours. BNP (last 3 results) Recent Labs    12/16/23 0455 01/03/24 1233 01/07/24 1019  PROBNP 3,285.0* 4,206.0* 13,727.0*   HbA1C: No results for input(s): HGBA1C in the last 72 hours. CBG: No results for input(s): GLUCAP in the last 168 hours. Lipid Profile: No results for input(s): CHOL, HDL, LDLCALC, TRIG, CHOLHDL, LDLDIRECT in the last 72 hours. Thyroid  Function Tests: No results for input(s): TSH, T4TOTAL, FREET4, T3FREE, THYROIDAB in the last 72 hours. Anemia Panel: No results for input(s): VITAMINB12, FOLATE, FERRITIN, TIBC, IRON , RETICCTPCT in the last 72 hours. Urine analysis:    Component Value Date/Time   COLORURINE STRAW (A) 05/17/2022 2057   APPEARANCEUR CLEAR 05/17/2022 2057   LABSPEC 1.009 05/17/2022 2057   PHURINE 6.0 05/17/2022 2057   GLUCOSEU NEGATIVE 05/17/2022 2057   HGBUR NEGATIVE 05/17/2022 2057   BILIRUBINUR NEGATIVE 05/17/2022 2057   KETONESUR NEGATIVE 05/17/2022 2057   PROTEINUR NEGATIVE 05/17/2022 2057   UROBILINOGEN 0.2 12/04/2014 1737   NITRITE NEGATIVE 05/17/2022 2057   LEUKOCYTESUR NEGATIVE 05/17/2022 2057    Radiological Exams on Admission: DG Chest Port 1 View Result Date: 01/07/2024 EXAM: 1 VIEW(S) XRAY OF THE CHEST 01/07/2024 09:53:00 AM COMPARISON: 01/03/2024 CLINICAL HISTORY: SOB FINDINGS: LUNGS AND PLEURA: Right lung postsurgical changes. Moderate loculated right pleural effusion, stable . Hazy opacity in right lung likely due to  layering pleural effusion. Diffuse interstitial opacities, unchanged . No pneumothorax. HEART AND MEDIASTINUM: TAVR in place. Aortic calcification. Enlarged cardiomediastinal silhouette, unchanged. BONES AND SOFT TISSUES: Sternotomy wires present. No acute osseous abnormality. IMPRESSION: 1. Moderate loculated right pleural effusion with  associated hazy right lung opacity unchanged from previous exam. 2. Diffuse interstitial opacities compatible with pulmonary edema , also unchanged. . Electronically signed by: Waddell Calk MD 01/07/2024 09:57 AM EST RP Workstation: [id number]    EKG: Independently reviewed. SR 82bpm.  Assessment/Plan Principal Problem:   Acute hypercapnic respiratory failure (HCC)    Acute on chronic hypoxemic/hypercapnic respiratory failure-likely due to worsening loculated pleural effusion -Wears 5 L nasal cannula at baseline -No current need for Bipap - Continue oral prednisone  -Continue oral torsemide  and monitor diuresis -Agreeable to thoracentesis on Monday 11/24 -Xanax  prn anxiety   CAD/prior CABG - Underwent recent cardiac catheterization on 11/3 - 2D echocardiogram with LVEF 55-60% - Denies any current chest pain   Acute blood loss anemia-stable -Likely in the setting of GI bleed with ongoing Eliquis  use -Positive FOBT noted - Hemoglobin currently stable, monitor on ASA 81mg  daily and no further Eliquis  -Continue on PPI BID   Atrial fibrillation-currently in SR - Currently rate controlled - Held Eliquis  as noted on recent discharge and now on ASA 81mg  daily -Continue metoprolol  XL - Monitor on telemetry, okay for telemetry floor   Dyslipidemia - Atorvastatin    GERD - PPI   Anxiety - Alprazolam  prn anxiety   DVT prophylaxis: SCDs Code Status: DNR Family Communication: None at bedside Disposition Plan:Admit for pleural effusion and thoracentesis Consults called:Palliative Admission status: Inpatient, Tele  Severity of Illness: The  appropriate patient status for this patient is INPATIENT. Inpatient status is judged to be reasonable and necessary in order to provide the required intensity of service to ensure the patient's safety. The patient's presenting symptoms, physical exam findings, and initial radiographic and laboratory data in the context of their chronic comorbidities is felt to place them at high risk for further clinical deterioration. Furthermore, it is not anticipated that the patient will be medically stable for discharge from the hospital within 2 midnights of admission.   * I certify that at the point of admission it is my clinical judgment that the patient will require inpatient hospital care spanning beyond 2 midnights from the point of admission due to high intensity of service, high risk for further deterioration and high frequency of surveillance required.*   Keylen Eckenrode D Narada Uzzle DO [facility]  If 7PM-7AM, please contact night-coverage [email or web address]  01/07/2024, 12:08 PM

## 2024-01-07 NOTE — ED Provider Notes (Signed)
 [place] EMERGENCY DEPARTMENT AT [facility] Provider Note   CSN: [id number] Arrival date & time: 01/07/24  9085     Patient presents with: Shortness of Breath   Emily Rocha is a 68 y.o. female.    Shortness of Breath  This patient is a 68 year old female, she is oxygen  dependent on 4 L of oxygen  chronically, she has lung disease, heart failure and is on torsemide , she also recently was admitted with blood in her stools and was discontinued on her anticoagulants, based on the medical record she had been admitted 1 month ago with acute on chronic congestive heart failure and atrial fibrillation, she was readmitted 3 weeks later on November 18 and stayed approximately 3 days being discharged yesterday.  Her Eliquis  was discontinued and switched over to aspirin  81 mg a day, she was noted to have blood in her stools and it was recommended that she take a PPI.  She has had a prior TAVR and has chronic cor pulmonale she was offered an endoscopy but the patient declined stating that she did not want any more procedures as she was too tired to have any other procedures.  Ultimately she was diagnosed on discharge with acute on chronic hypoxemic respiratory failure which was multifactorial with acute COPD, chronic diastolic CHF, and worsening anemia likely related to the GI bleed in the setting of Eliquis  use.  The patient presents today with some shortness of breath that was present when she woke up this morning, she was wearing her 4 L, paramedics reported that she was in the mid 80% range and turned her onto a nonrebreather.  She is now on 4 L at 96% and not having any additional symptoms.  She reports that her legs are less swollen after her admission and she diuresed well    Prior to Admission medications   Medication Sig Start Date End Date Taking? Authorizing Provider  albuterol  (PROVENTIL ) (2.5 MG/3ML) 0.083% nebulizer solution Take 2.5 mg by nebulization every 4 (four) hours as  needed for wheezing or shortness of breath. 03/30/19   [provider]  albuterol  (VENTOLIN  HFA) 108 (90 Base) MCG/ACT inhaler Inhale 1-2 puffs into the lungs every 6 (six) hours as needed for wheezing or shortness of breath.    [provider]  ALPRAZolam  (XANAX ) 0.5 MG tablet Take 0.5 mg by mouth 3 (three) times daily.    [provider]  aspirin  EC 81 MG tablet Take 1 tablet (81 mg total) by mouth daily. Swallow whole. 01/06/24 01/05/25  Maree, Pratik D, DO  atorvastatin  (LIPITOR ) 40 MG tablet Take 1 tablet (40 mg total) daily by mouth. 12/27/16   Gold, Lemond E, PA-C  diphenhydramine -acetaminophen  (TYLENOL  PM) 25-500 MG TABS tablet Take 2 tablets by mouth at bedtime.    [provider]  Fluticasone-Umeclidin-Vilant (TRELEGY ELLIPTA ) 100-62.5-25 MCG/ACT AEPB Inhale 1 Inhalation into the lungs daily. 11/14/23   Alghanim, Paula, MD  ipratropium-albuterol  (DUONEB) 0.5-2.5 (3) MG/3ML SOLN One in am and one at supper time 10/06/23   Darlean Ozell NOVAK, MD  magnesium  oxide (MAG-OX) 400 (240 Mg) MG tablet Take 400 mg by mouth daily.    [provider]  metoprolol  succinate (TOPROL -XL) 25 MG 24 hr tablet Take 1 tablet (25 mg total) by mouth at bedtime. 12/20/23   Arrien, Mauricio Daniel, MD  midodrine  (PROAMATINE ) 5 MG tablet Take 1 tablet (5 mg total) by mouth 3 (three) times daily with meals. 12/20/23   Arrien, Elidia Sieving, MD  pantoprazole  (  PROTONIX ) 40 MG tablet Take 1 tablet (40 mg total) by mouth 2 (two) times daily. 01/06/24 02/05/24  Maree, Pratik D, DO  potassium chloride  SA (KLOR-CON  M) 20 MEQ tablet Take 1 tablet (20 mEq total) by mouth in the morning. 12/20/23   Arrien, Mauricio Daniel, MD  predniSONE  (DELTASONE ) 20 MG tablet Take 2 tablets (40 mg total) by mouth daily with breakfast for 3 days. 01/07/24 01/10/24  Maree, Pratik D, DO  torsemide  (DEMADEX ) 20 MG tablet Take 2 tablets (40 mg total) by mouth daily. 12/20/23   Arrien, Elidia Sieving, MD     Allergies: Levaquin  [levofloxacin ]    Review of Systems  Respiratory:  Positive for shortness of breath.   All other systems reviewed and are negative.   Updated Vital Signs BP (!) 124/55 (BP Location: Right Arm)   Pulse 82   Temp 98 F (36.7 C) (Oral)   Resp (!) 22   SpO2 98%   Physical Exam Vitals and nursing note reviewed.  Constitutional:      General: She is not in acute distress.    Appearance: She is well-developed.  HENT:     Head: Normocephalic and atraumatic.     Mouth/Throat:     Pharynx: No oropharyngeal exudate.  Eyes:     General: No scleral icterus.       Right eye: No discharge.        Left eye: No discharge.     Conjunctiva/sclera: Conjunctivae normal.     Pupils: Pupils are equal, round, and reactive to light.  Neck:     Thyroid : No thyromegaly.     Vascular: No JVD.  Cardiovascular:     Rate and Rhythm: Normal rate and regular rhythm.     Heart sounds: Normal heart sounds. No murmur heard.    No friction rub. No gallop.  Pulmonary:     Effort: Pulmonary effort is normal. No respiratory distress.     Breath sounds: Wheezing and rales present.  Abdominal:     General: Bowel sounds are normal. There is no distension.     Palpations: Abdomen is soft. There is no mass.     Tenderness: There is no abdominal tenderness.  Musculoskeletal:        General: No tenderness. Normal range of motion.     Cervical back: Normal range of motion and neck supple.     Right lower leg: Edema present.     Left lower leg: Edema present.     Comments: Mild symmetrical 1+ pitting edema below the knees  Lymphadenopathy:     Cervical: No cervical adenopathy.  Skin:    General: Skin is warm and dry.     Findings: No erythema or rash.  Neurological:     Mental Status: She is alert.     Coordination: Coordination normal.  Psychiatric:        Behavior: Behavior normal.     (all labs ordered are listed, but only abnormal results are displayed) Labs Reviewed   BLOOD GAS, ARTERIAL - Abnormal; Notable for the following components:      Result Value   pCO2 arterial 91 (*)    pO2, Arterial 37 (*)    Bicarbonate 55.1 (*)    Acid-Base Excess 24.3 (*)    All other components within normal limits  CBC WITH DIFFERENTIAL/PLATELET - Abnormal; Notable for the following components:   RBC 2.95 (*)    Hemoglobin 8.4 (*)    HCT 29.2 (*)    MCHC  28.8 (*)    Monocytes Absolute 1.5 (*)    All other components within normal limits  COMPREHENSIVE METABOLIC PANEL WITH GFR - Abnormal; Notable for the following components:   Chloride 89 (*)    CO2 >45 (*)    BUN 24 (*)    Total Bilirubin 1.3 (*)    All other components within normal limits  PRO BRAIN NATRIURETIC PEPTIDE - Abnormal; Notable for the following components:   Pro Brain Natriuretic Peptide 13,727.0 (*)    All other components within normal limits  TROPONIN T, HIGH SENSITIVITY - Abnormal; Notable for the following components:   Troponin T High Sensitivity 87 (*)    All other components within normal limits  TROPONIN T, HIGH SENSITIVITY    EKG: EKG Interpretation Date/Time:  Saturday January 07 2024 09:24:10 EST Ventricular Rate:  82 PR Interval:  138 QRS Duration:  96 QT Interval:  385 QTC Calculation: 450 R Axis:   98  Text Interpretation: Sinus rhythm Right axis deviation Low voltage, precordial leads Nonspecific T abnrm, anterolateral leads Confirmed by Cleotilde Rogue [phone]) on 01/07/2024 10:34:37 AM  Radiology: [facility] Chest Port 1 View Result Date: 01/07/2024 EXAM: 1 VIEW(S) XRAY OF THE CHEST 01/07/2024 09:53:00 AM COMPARISON: 01/03/2024 CLINICAL HISTORY: SOB FINDINGS: LUNGS AND PLEURA: Right lung postsurgical changes. Moderate loculated right pleural effusion, stable . Hazy opacity in right lung likely due to layering pleural effusion. Diffuse interstitial opacities, unchanged . No pneumothorax. HEART AND MEDIASTINUM: TAVR in place. Aortic calcification. Enlarged cardiomediastinal  silhouette, unchanged. BONES AND SOFT TISSUES: Sternotomy wires present. No acute osseous abnormality. IMPRESSION: 1. Moderate loculated right pleural effusion with associated hazy right lung opacity unchanged from previous exam. 2. Diffuse interstitial opacities compatible with pulmonary edema , also unchanged. . Electronically signed by: Waddell Calk MD 01/07/2024 09:57 AM EST RP Workstation: [id number]     .Critical Care  Performed by: Cleotilde Rogue, MD Authorized by: Cleotilde Rogue, MD   Critical care provider statement:    Critical care time (minutes):  45   Critical care time was exclusive of:  Separately billable procedures and treating other patients and teaching time   Critical care was necessary to treat or prevent imminent or life-threatening deterioration of the following conditions:  Sepsis   Critical care was time spent personally by me on the following activities:  Development of treatment plan with patient or surrogate, discussions with consultants, evaluation of patient's response to treatment, examination of patient, obtaining history from patient or surrogate, review of old charts, re-evaluation of patient's condition, pulse oximetry, ordering and review of radiographic studies, ordering and review of laboratory studies and ordering and performing treatments and interventions   I assumed direction of critical care for this patient from another provider in my specialty: no     Care discussed with: admitting provider   Comments:          Medications Ordered in the ED  albuterol  (PROVENTIL ) (2.5 MG/3ML) 0.083% nebulizer solution 2.5 mg (2.5 mg Nebulization Not Given 01/07/24 0937)  ipratropium-albuterol  (DUONEB) 0.5-2.5 (3) MG/3ML nebulizer solution 3 mL (3 mLs Nebulization Not Given 01/07/24 0935)  albuterol  (PROVENTIL ) (2.5 MG/3ML) 0.083% nebulizer solution (  Not Given 01/07/24 0937)  albuterol  (PROVENTIL ) (2.5 MG/3ML) 0.083% nebulizer solution 10 mg (10 mg Nebulization New  Bag/Given 01/07/24 0945)  methylPREDNISolone  sodium succinate (SOLU-MEDROL ) 125 mg/2 mL injection 125 mg (has no administration in time range)  Medical Decision Making Amount and/or Complexity of Data Reviewed Labs: ordered. Radiology: ordered.  Risk Prescription drug management. Decision regarding hospitalization.    This patient presents to the ED for concern of recurrent and ongoing chronic shortness of breath, this involves an extensive number of treatment options, and is a complaint that carries with it a high risk of complications and morbidity.  The differential diagnosis includes likely combination of multiple things including COPD as well as CHF, she has chronic cor pulmonale, all of these things contribute to chronic hypoxic respiratory failure.  She is not hypoxic at this time.   Co morbidities / Chronic conditions that complicate the patient evaluation  Recent admission for GI bleed   Additional history obtained:  Additional history obtained from EMR External records from outside source obtained and reviewed including admission and discharge summaries from recent admission   Lab Tests:  I Ordered, and personally interpreted labs.  The pertinent results include: ABG ordered however VBG was obtained accidentally, it shows a pH and a CO2 that are abnormal and consistent with a hypercapnic state, the patient's mental status is unremarkable   Imaging Studies ordered:  I ordered imaging studies including chest x-ray I independently visualized and interpreted imaging which showed loculated pleural effusion on the right I agree with the radiologist interpretation   Cardiac Monitoring: / EKG:  The patient was maintained on a cardiac monitor.  I personally viewed and interpreted the cardiac monitored which showed an underlying rhythm of: Sinus rhythm   Problem List / ED Course / Critical interventions / Medication management  This  patient has a history of prior admissions, prior thoracentesis, she is amenable to recurrent pleural fluid removal by thoracentesis, she does not want to have any other more aggressive procedures, I think this would make a difference in her shortness of breath.  She is on 4 L but she does get short of breath on the 4 L and then has to be turned up from time to time I ordered medication including BiPAP Reevaluation of the patient after these medicines showed that the patient some improvement I have reviewed the patients home medicines and have made adjustments as needed   Consultations Obtained:  I requested consultation with the hospitalist,  and discussed lab and imaging findings as well as pertinent plan - they recommend: Admission   Social Determinants of Health:  Chronically ill   Test / Admission - Considered:  Admit to high level of care      Final diagnoses:  Acute on chronic respiratory failure with hypoxia and hypercapnia (HCC)  Pleural effusion on right    ED Discharge Orders     None          Cleotilde Rogue, MD 01/07/24 1121

## 2024-01-08 ENCOUNTER — Encounter (HOSPITAL_COMMUNITY): Payer: Self-pay | Admitting: Internal Medicine

## 2024-01-08 DIAGNOSIS — J9602 Acute respiratory failure with hypercapnia: Secondary | ICD-10-CM

## 2024-01-08 LAB — CBC
HCT: 26.2 % — ABNORMAL LOW (ref 36.0–46.0)
Hemoglobin: 7.6 g/dL — ABNORMAL LOW (ref 12.0–15.0)
MCH: 28.5 pg (ref 26.0–34.0)
MCHC: 29 g/dL — ABNORMAL LOW (ref 30.0–36.0)
MCV: 98.1 fL (ref 80.0–100.0)
Platelets: 173 K/uL (ref 150–400)
RBC: 2.67 MIL/uL — ABNORMAL LOW (ref 3.87–5.11)
RDW: 15.5 % (ref 11.5–15.5)
WBC: 6.8 K/uL (ref 4.0–10.5)
nRBC: 0 % (ref 0.0–0.2)

## 2024-01-08 LAB — BASIC METABOLIC PANEL WITH GFR
BUN: 27 mg/dL — ABNORMAL HIGH (ref 8–23)
CO2: >45 mmol/L — ABNORMAL HIGH (ref 22–32)
Calcium: 9.4 mg/dL (ref 8.9–10.3)
Chloride: 86 mmol/L — ABNORMAL LOW (ref 98–111)
Creatinine, Ser: 0.7 mg/dL (ref 0.44–1.00)
GFR, Estimated: >60 mL/min (ref >60)
Glucose, Bld: 103 mg/dL — ABNORMAL HIGH (ref 70–99)
Potassium: 4.5 mmol/L (ref 3.5–5.1)
Sodium: 138 mmol/L (ref 135–145)

## 2024-01-08 LAB — MAGNESIUM: Magnesium: 2.3 mg/dL (ref 1.7–2.4)

## 2024-01-08 MED ORDER — FUROSEMIDE 10 MG/ML IJ SOLN
40.0000 mg | Freq: Two times a day (BID) | INTRAMUSCULAR | Status: DC
  Administered 2024-01-08 – 2024-01-09 (×2): 40 mg via INTRAVENOUS
  Filled 2024-01-08 (×2): qty 4

## 2024-01-08 MED ORDER — ORAL CARE MOUTH RINSE
15.0000 mL | OROMUCOSAL | Status: DC | PRN
Start: 2024-01-08 — End: 2024-01-10

## 2024-01-08 NOTE — Progress Notes (Signed)
 PROGRESS NOTE    Emily Rocha  FMW:[id number] DOB: 09/08/1955 DOA: 01/07/2024 PCP: Rosamond Leta NOVAK, MD   Brief Narrative:    Emily Rocha is a 68 y.o. female with medical history significant for COPD/asthma, CAD, PVD, prior TAVR, atrial fibrillation, diastolic CHF with chronic cor pulmonale, dyslipidemia, GERD, and anxiety disorder who presented to the ED with worsening of shortness of breath who was just discharged on 11/21 after admission for acute on chronic hypoxemic/hypercapnic respiratory failure secondary to acute on chronic diastolic CHF exacerbation as well as acute COPD exacerbation.  She has now been readmitted with worsening dyspnea likely related to loculated pleural effusion and will require thoracentesis on 11/24.  She has been started on IV diuresis in the interim.  Assessment & Plan:   Principal Problem:   Acute hypercapnic respiratory failure (HCC)  Assessment and Plan:   Acute on chronic hypoxemic/hypercapnic respiratory failure-likely due to worsening loculated pleural effusion -Wears 5 L nasal cannula at baseline -No current need for Bipap - Continue oral prednisone  -Continue oral torsemide  and monitor diuresis -Agreeable to thoracentesis on Monday 11/24 -Xanax  prn anxiety -Palliative care for goals of care discussion while hospitalized as she was to see outpatient palliative soon.   CAD/prior CABG - Underwent recent cardiac catheterization on 11/3 - 2D echocardiogram with LVEF 55-60% - Denies any current chest pain   Acute blood loss anemia-stable -Likely in the setting of GI bleed with ongoing Eliquis  use -Positive FOBT noted - Hemoglobin currently stable, monitor on ASA 81mg  daily and no further Eliquis  -Continue on PPI BID   Atrial fibrillation-currently in SR - Currently rate controlled - Held Eliquis  as noted on recent discharge and now on ASA 81mg  daily -Continue metoprolol  XL - Monitor on telemetry, okay for telemetry floor   Dyslipidemia -  Atorvastatin    GERD - PPI   Anxiety - Alprazolam  prn anxiety    DVT prophylaxis: SCDs Code Status: DNR Family Communication: None at bedside Disposition Plan:  Status is: Inpatient Remains inpatient appropriate because: Need for IV medications and thoracentesis.  Consultants:  IR Palliative care  Procedures:  None  Antimicrobials:  None   Subjective: Patient seen and evaluated today with no new acute complaints or concerns. No acute concerns or events noted overnight.  She denies any worsening dyspnea and is eagerly awaiting thoracentesis tomorrow.  Objective: Vitals:   01/07/24 2034 01/08/24 0352 01/08/24 0355 01/08/24 0457  BP: 130/62 (!) 121/57    Pulse: 88 75    Resp: 15 15    Temp: 98.4 F (36.9 C) 98 F (36.7 C)    TempSrc: Oral Oral    SpO2: 93% 95%    Weight:   57.9 kg   Height:    5' 5 (1.651 m)    Intake/Output Summary (Last 24 hours) at 01/08/2024 1043 Last data filed at 01/08/2024 0852 Gross per 24 hour  Intake 960 ml  Output 300 ml  Net 660 ml   Filed Weights   01/08/24 0355  Weight: 57.9 kg    Examination:  General exam: Appears calm and comfortable  Respiratory system: Clear to auscultation. Respiratory effort normal.  6 L nasal cannula Cardiovascular system: S1 & S2 heard, RRR.  Gastrointestinal system: Abdomen is soft Central nervous system: Alert and awake Extremities: No edema Skin: No significant lesions noted Psychiatry: Flat affect.    Data Reviewed: I have personally reviewed following labs and imaging studies  CBC: Recent Labs  Lab 01/03/24 1233 01/04/24 0515 01/05/24 0416 01/05/24  1719 01/06/24 0434 01/07/24 1019 01/08/24 0343  WBC 16.2* 8.8 9.8  --  9.5 9.3 6.8  NEUTROABS 14.4*  --   --   --   --  6.5  --   HGB 8.3* 8.2* 7.7* 8.1* 7.6* 8.4* 7.6*  HCT 29.3* 28.5* 26.5* 28.2* 25.7* 29.2* 26.2*  MCV 100.0 99.7 98.1  --  98.1 99.0 98.1  PLT 167 149* 171  --  179 215 173   Basic Metabolic Panel: Recent  Labs  Lab 01/03/24 1233 01/04/24 0515 01/05/24 0416 01/06/24 0434 01/07/24 1019  NA 141 141 140 140 139  K 4.2 4.3 4.3 3.7 4.3  CL 96* 94* 92* 90* 89*  CO2 42* 44* >45* >45* >45*  GLUCOSE 114* 122* 160* 92 77  BUN 13 14 20 17  24*  CREATININE 0.57 0.58 0.64 0.50 0.62  CALCIUM  8.9 8.9 9.3 9.3 9.8  MG 2.3 2.2 2.2 2.2  --    GFR: Estimated Creatinine Clearance: 58.9 mL/min (by C-G formula based on SCr of 0.62 mg/dL). Liver Function Tests: Recent Labs  Lab 01/03/24 1233 01/07/24 1019  AST 22 35  ALT 11 19  ALKPHOS 138* 122  BILITOT 0.7 1.3*  PROT 6.5 6.8  ALBUMIN  3.9 4.0   No results for input(s): LIPASE, AMYLASE in the last 168 hours. No results for input(s): AMMONIA in the last 168 hours. Coagulation Profile: No results for input(s): INR, PROTIME in the last 168 hours. Cardiac Enzymes: No results for input(s): CKTOTAL, CKMB, CKMBINDEX, TROPONINI in the last 168 hours. BNP (last 3 results) Recent Labs    12/16/23 0455 01/03/24 1233 01/07/24 1019  PROBNP 3,285.0* 4,206.0* 13,727.0*   HbA1C: No results for input(s): HGBA1C in the last 72 hours. CBG: No results for input(s): GLUCAP in the last 168 hours. Lipid Profile: No results for input(s): CHOL, HDL, LDLCALC, TRIG, CHOLHDL, LDLDIRECT in the last 72 hours. Thyroid  Function Tests: No results for input(s): TSH, T4TOTAL, FREET4, T3FREE, THYROIDAB in the last 72 hours. Anemia Panel: No results for input(s): VITAMINB12, FOLATE, FERRITIN, TIBC, IRON , RETICCTPCT in the last 72 hours. Sepsis Labs: No results for input(s): PROCALCITON, LATICACIDVEN in the last 168 hours.  Recent Results (from the past 240 hours)  Resp panel by RT-PCR (RSV, Flu A&B, Covid) Anterior Nasal Swab     Status: None   Collection Time: 01/03/24 12:53 PM   Specimen: Anterior Nasal Swab  Result Value Ref Range Status   SARS Coronavirus 2 by RT PCR NEGATIVE NEGATIVE Final    Comment:  (NOTE) SARS-CoV-2 target nucleic acids are NOT DETECTED.  The SARS-CoV-2 RNA is generally detectable in upper respiratory specimens during the acute phase of infection. The lowest concentration of SARS-CoV-2 viral copies this assay can detect is 138 copies/mL. A negative result does not preclude SARS-Cov-2 infection and should not be used as the sole basis for treatment or other patient management decisions. A negative result may occur with  improper specimen collection/handling, submission of specimen other than nasopharyngeal swab, presence of viral mutation(s) within the areas targeted by this assay, and inadequate number of viral copies(<138 copies/mL). A negative result must be combined with clinical observations, patient history, and epidemiological information. The expected result is Negative.  Fact Sheet for Patients:  bloggercourse.com  Fact Sheet for Healthcare Providers:  seriousbroker.it  This test is no t yet approved or cleared by the [place]  FDA and  has been authorized for detection and/or diagnosis of SARS-CoV-2 by FDA under an Emergency Use Authorization (EUA).  This EUA will remain  in effect (meaning this test can be used) for the duration of the COVID-19 declaration under Section 564(b)(1) of the Act, 21 U.S.C.section 360bbb-3(b)(1), unless the authorization is terminated  or revoked sooner.       Influenza A by PCR NEGATIVE NEGATIVE Final   Influenza B by PCR NEGATIVE NEGATIVE Final    Comment: (NOTE) The Xpert Xpress SARS-CoV-2/FLU/RSV plus assay is intended as an aid in the diagnosis of influenza from Nasopharyngeal swab specimens and should not be used as a sole basis for treatment. Nasal washings and aspirates are unacceptable for Xpert Xpress SARS-CoV-2/FLU/RSV testing.  Fact Sheet for Patients: bloggercourse.com  Fact Sheet for Healthcare  Providers: seriousbroker.it  This test is not yet approved or cleared by the [place]  FDA and has been authorized for detection and/or diagnosis of SARS-CoV-2 by FDA under an Emergency Use Authorization (EUA). This EUA will remain in effect (meaning this test can be used) for the duration of the COVID-19 declaration under Section 564(b)(1) of the Act, 21 U.S.C. section 360bbb-3(b)(1), unless the authorization is terminated or revoked.     Resp Syncytial Virus by PCR NEGATIVE NEGATIVE Final    Comment: (NOTE) Fact Sheet for Patients: bloggercourse.com  Fact Sheet for Healthcare Providers: seriousbroker.it  This test is not yet approved or cleared by the [place]  FDA and has been authorized for detection and/or diagnosis of SARS-CoV-2 by FDA under an Emergency Use Authorization (EUA). This EUA will remain in effect (meaning this test can be used) for the duration of the COVID-19 declaration under Section 564(b)(1) of the Act, 21 U.S.C. section 360bbb-3(b)(1), unless the authorization is terminated or revoked.  Performed at [facility], [address] [place], [place] [address]   MRSA Next Gen by PCR, Nasal     Status: None   Collection Time: 01/03/24  4:17 PM   Specimen: Nasal Mucosa; Nasal Swab  Result Value Ref Range Status   MRSA by PCR Next Gen NOT DETECTED NOT DETECTED Final    Comment: (NOTE) The GeneXpert MRSA Assay (FDA approved for NASAL specimens only), is one component of a comprehensive MRSA colonization surveillance program. It is not intended to diagnose MRSA infection nor to guide or monitor treatment for MRSA infections. Test performance is not FDA approved in patients less than 72 years old. Performed at [facility], [address], [place], [place] [address]          Radiology Studies: [facility] Chest [facility] 1 View Result Date: 01/07/2024 EXAM: 1 VIEW(S) XRAY OF THE CHEST  01/07/2024 09:53:00 AM COMPARISON: 01/03/2024 CLINICAL HISTORY: SOB FINDINGS: LUNGS AND PLEURA: Right lung postsurgical changes. Moderate loculated right pleural effusion, stable . Hazy opacity in right lung likely due to layering pleural effusion. Diffuse interstitial opacities, unchanged . No pneumothorax. HEART AND MEDIASTINUM: TAVR in place. Aortic calcification. Enlarged cardiomediastinal silhouette, unchanged. BONES AND SOFT TISSUES: Sternotomy wires present. No acute osseous abnormality. IMPRESSION: 1. Moderate loculated right pleural effusion with associated hazy right lung opacity unchanged from previous exam. 2. Diffuse interstitial opacities compatible with pulmonary edema , also unchanged. . Electronically signed by: Waddell Calk MD 01/07/2024 09:57 AM EST RP Workstation: [id number]        Scheduled Meds:  aspirin  EC  81 mg Oral Daily   atorvastatin   40 mg Oral Daily   Chlorhexidine  Gluconate Cloth  6 each Topical Q0600   furosemide   40 mg Intravenous Q12H   magnesium  oxide  400 mg Oral Daily   metoprolol  succinate  25 mg Oral QHS   midodrine   5 mg Oral TID WC   pantoprazole   40 mg Oral BID   potassium chloride  SA  20 mEq Oral Daily   predniSONE   40 mg Oral Q breakfast     LOS: 1 day    Time spent: 55 minutes    Aliese Brannum JONETTA Fairly, DO [facility]  If 7PM-7AM, please contact night-coverage [email or web address] 01/08/2024, 10:43 AM

## 2024-01-08 NOTE — Plan of Care (Signed)

## 2024-01-08 NOTE — Plan of Care (Signed)
   Problem: Education: Goal: Knowledge of General Education information will improve Description Including pain rating scale, medication(s)/side effects and non-pharmacologic comfort measures Outcome: Progressing

## 2024-01-08 NOTE — TOC Initial Note (Signed)
 Transition of Care [facility]) - Initial/Assessment Note    Patient Details  Name: Emily Rocha MRN: [id number] Date of Birth: Nov 24, 1955  Transition of Care [facility]) CM/SW Contact:    Hoy DELENA Bigness, LCSW Phone Number: 01/08/2024, 9:14 AM  Clinical Narrative:                 Pt assessed due to high risk for readmission. Pt is from home with her spouse. Pt recently discharged from the hospital on 11/21 and was referred to [facility] for outpatient palliative care at that time. Pt is on 4L of O2 at baseline provided through [facility]. Pt is active with [facility] for HHPT. ROC orders will need to be placed prior to discharge. TOC will continue to follow for discharge planning needs.   Expected Discharge Plan: Home w Home Health Services Barriers to Discharge: Continued Medical Work up   Patient Goals and CMS Choice Patient states their goals for this hospitalization and ongoing recovery are:: To return home CMS Medicare.gov Compare Post Acute Care list provided to:: Patient Choice offered to / list presented to : Patient      Expected Discharge Plan and Services In-house Referral: Clinical Social Work Discharge Planning Services: NA Post Acute Care Choice: Home Health Living arrangements for the past 2 months: Single Family Home                                      Prior Living Arrangements/Services Living arrangements for the past 2 months: Single Family Home Lives with:: Spouse Patient language and need for interpreter reviewed:: Yes Do you feel safe going back to the place where you live?: Yes      Need for Family Participation in Patient Care: No (Comment) Care giver support system in place?: Yes (comment) Current home services: DME (4L O2 w/ [facility]) Criminal Activity/Legal Involvement Pertinent to Current Situation/Hospitalization: No - Comment as needed  Activities of Daily Living   ADL Screening (condition at time of admission) Independently performs ADLs?: Yes  (appropriate for developmental age) Is the patient deaf or have difficulty hearing?: Yes Does the patient have difficulty seeing, even when wearing glasses/contacts?: No Does the patient have difficulty concentrating, remembering, or making decisions?: No  Permission Sought/Granted Permission sought to share information with : Facility Medical Sales Representative                Emotional Assessment Appearance:: Appears stated age   Affect (typically observed): Accepting, Pleasant Orientation: : Oriented to Self, Oriented to Place, Oriented to  Time, Oriented to Situation Alcohol  / Substance Use: Not Applicable Psych Involvement: No (comment)  Admission diagnosis:  Pleural effusion on right [J90] Acute hypercapnic respiratory failure (HCC) [J96.02] Acute on chronic respiratory failure with hypoxia and hypercapnia (HCC) [G03.78, J96.22] Patient Active Problem List   Diagnosis Date Noted   Acute hypercapnic respiratory failure (HCC) 01/07/2024   Acute hypoxemic respiratory failure (HCC) 01/03/2024   Acute on chronic congestive heart failure (HCC) 12/18/2023   History of pulmonary embolism 12/18/2023   Right ventricular failure (HCC) 12/18/2023   Atrial fibrillation (HCC) 12/18/2023   Hyponatremia 12/17/2023   Pulmonary hypertension, unspecified (HCC) 12/16/2023   RVF (right ventricular failure) (HCC) 12/16/2023   History of transcatheter aortic valve replacement (TAVR) 12/16/2023   Acute on chronic diastolic heart failure (HCC) 12/09/2023   Atrial fibrillation with RVR (HCC) 12/09/2023   GERD without esophagitis 12/09/2023   Chronic  obstructive pulmonary disease (COPD) (HCC) 12/09/2023   Dyslipidemia 12/09/2023   Anxiety 12/09/2023   Acute on chronic respiratory failure (HCC) 08/08/2023   Chronic respiratory failure with hypoxia and hypercapnia (HCC) 03/28/2022   Acute respiratory failure with hypercapnia (HCC) 03/28/2022   Elevated LFTs 03/28/2022   Elevated troponin 03/28/2022    Hyperkalemia 03/28/2022   Sepsis with acute hypoxic respiratory failure and septic shock (HCC) 03/28/2022   Pneumonia of right lower lobe due to infectious organism 03/28/2022   History of CVA (cerebrovascular accident) 08/12/2020   COPD GOLD 3 / 08/12/2020   S/P TAVR (transcatheter aortic valve replacement) 08/12/2020   CAD S/P percutaneous coronary angioplasty 06/11/2020   Severe aortic stenosis 05/19/2020   Chronic respiratory failure with hypoxia (HCC) 11/13/2019   Chronic diastolic CHF (congestive heart failure) (HCC) 11/13/2019   Subdural hematoma (HCC) 11/12/2019   Lip laceration 11/12/2019   Hypoalbuminemia 11/12/2019   Hiatal hernia 11/02/2019   History of lung cancer- Non-Small cell/S/p Prior VATs 10/24/2019   CAD (coronary artery disease)/Prior CABG 10/24/2019   S/P lobectomy of lung 10/26/2018   S/P CABG x 3 12/14/2016   COPD exacerbation (HCC) 12/12/2016   GERD (gastroesophageal reflux disease) 12/12/2016   Carotid stenosis 12/19/2014   PCP:  Rosamond Leta NOVAK, MD Pharmacy:   [facility] - [place],  - [address] [place] [address] Phone: [phone] Fax: [phone]     Social Drivers of Health (SDOH) Social History: SDOH Screenings   Food Insecurity: No Food Insecurity (01/07/2024)  Housing: Low Risk  (01/07/2024)  Transportation Needs: No Transportation Needs (01/07/2024)  Utilities: Not At Risk (01/07/2024)  Depression (PHQ2-9): Low Risk  (10/28/2020)  Social Connections: Socially Integrated (01/07/2024)  Recent Concern: Social Connections - Moderately Isolated (01/03/2024)  Tobacco Use: Medium Risk (01/08/2024)   SDOH Interventions:     Readmission Risk Interventions    01/08/2024    9:12 AM 12/09/2023   11:48 AM  Readmission Risk Prevention Plan  Medication Screening  Complete  Transportation Screening Complete Complete  PCP or Specialist Appt within 3-5 Days Complete   HRI or Home Care Consult Complete    Social Work Consult for Recovery Care Planning/Counseling Complete   Palliative Care Screening Complete   Medication Review Oceanographer) Complete

## 2024-01-09 ENCOUNTER — Encounter (HOSPITAL_COMMUNITY): Payer: Self-pay | Admitting: Internal Medicine

## 2024-01-09 ENCOUNTER — Inpatient Hospital Stay (HOSPITAL_COMMUNITY)

## 2024-01-09 DIAGNOSIS — Z7189 Other specified counseling: Secondary | ICD-10-CM

## 2024-01-09 DIAGNOSIS — Z515 Encounter for palliative care: Secondary | ICD-10-CM | POA: Diagnosis not present

## 2024-01-09 DIAGNOSIS — J9602 Acute respiratory failure with hypercapnia: Secondary | ICD-10-CM | POA: Diagnosis not present

## 2024-01-09 LAB — BASIC METABOLIC PANEL WITH GFR
BUN: 29 mg/dL — ABNORMAL HIGH (ref 8–23)
CO2: >45 mmol/L — ABNORMAL HIGH (ref 22–32)
Calcium: 9.3 mg/dL (ref 8.9–10.3)
Chloride: 88 mmol/L — ABNORMAL LOW (ref 98–111)
Creatinine, Ser: 0.7 mg/dL (ref 0.44–1.00)
GFR, Estimated: >60 mL/min (ref >60)
Glucose, Bld: 101 mg/dL — ABNORMAL HIGH (ref 70–99)
Potassium: 4.3 mmol/L (ref 3.5–5.1)
Sodium: 138 mmol/L (ref 135–145)

## 2024-01-09 LAB — CBC
HCT: 25.8 % — ABNORMAL LOW (ref 36.0–46.0)
Hemoglobin: 7.4 g/dL — ABNORMAL LOW (ref 12.0–15.0)
MCH: 28.2 pg (ref 26.0–34.0)
MCHC: 28.7 g/dL — ABNORMAL LOW (ref 30.0–36.0)
MCV: 98.5 fL (ref 80.0–100.0)
Platelets: 183 K/uL (ref 150–400)
RBC: 2.62 MIL/uL — ABNORMAL LOW (ref 3.87–5.11)
RDW: 15.8 % — ABNORMAL HIGH (ref 11.5–15.5)
WBC: 7.8 K/uL (ref 4.0–10.5)
nRBC: 0 % (ref 0.0–0.2)

## 2024-01-09 LAB — BODY FLUID CELL COUNT WITH DIFFERENTIAL
Lymphs, Fluid: 32 %
Monocyte-Macrophage-Serous Fluid: 32 % — ABNORMAL LOW (ref 50–90)
Neutrophil Count, Fluid: 36 % — ABNORMAL HIGH (ref 0–25)
Total Nucleated Cell Count, Fluid: 257 uL (ref 0–1000)

## 2024-01-09 LAB — GLUCOSE, PLEURAL OR PERITONEAL FLUID: Glucose, Fluid: 108 mg/dL

## 2024-01-09 LAB — PROTEIN, PLEURAL OR PERITONEAL FLUID: Total protein, fluid: <3 g/dL

## 2024-01-09 LAB — GRAM STAIN

## 2024-01-09 LAB — MAGNESIUM: Magnesium: 2.4 mg/dL (ref 1.7–2.4)

## 2024-01-09 MED ORDER — BUDESONIDE 0.25 MG/2ML IN SUSP
0.2500 mg | Freq: Two times a day (BID) | RESPIRATORY_TRACT | Status: DC
  Administered 2024-01-09 – 2024-01-10 (×2): 0.25 mg via RESPIRATORY_TRACT
  Filled 2024-01-09 (×2): qty 2

## 2024-01-09 MED ORDER — IPRATROPIUM-ALBUTEROL 0.5-2.5 (3) MG/3ML IN SOLN
3.0000 mL | Freq: Three times a day (TID) | RESPIRATORY_TRACT | Status: DC
  Administered 2024-01-09 – 2024-01-10 (×2): 3 mL via RESPIRATORY_TRACT
  Filled 2024-01-09 (×2): qty 3

## 2024-01-09 MED ORDER — LIDOCAINE HCL (PF) 2 % IJ SOLN
10.0000 mL | Freq: Once | INTRAMUSCULAR | Status: AC
  Administered 2024-01-09: 10 mL
  Filled 2024-01-09: qty 10

## 2024-01-09 MED ORDER — BISOPROLOL FUMARATE 5 MG PO TABS
2.5000 mg | ORAL_TABLET | Freq: Every day | ORAL | Status: DC
  Administered 2024-01-09: 2.5 mg via ORAL
  Filled 2024-01-09: qty 1

## 2024-01-09 MED ORDER — IPRATROPIUM-ALBUTEROL 0.5-2.5 (3) MG/3ML IN SOLN
3.0000 mL | Freq: Four times a day (QID) | RESPIRATORY_TRACT | Status: DC
  Administered 2024-01-09: 3 mL via RESPIRATORY_TRACT
  Filled 2024-01-09: qty 3

## 2024-01-09 MED ORDER — MORPHINE SULFATE (CONCENTRATE) 10 MG /0.5 ML PO SOLN: 5.0000 mg | ORAL | Status: DC | PRN

## 2024-01-09 MED ORDER — FUROSEMIDE 10 MG/ML IJ SOLN
80.0000 mg | Freq: Two times a day (BID) | INTRAMUSCULAR | Status: DC
  Administered 2024-01-09 – 2024-01-10 (×2): 80 mg via INTRAVENOUS
  Filled 2024-01-09 (×2): qty 8

## 2024-01-09 MED ORDER — LIDOCAINE HCL (PF) 2 % IJ SOLN
INTRAMUSCULAR | Status: AC
  Filled 2024-01-09: qty 10

## 2024-01-09 NOTE — Plan of Care (Signed)
  Problem: Education: Goal: Knowledge of General Education information will improve Description: Including pain rating scale, medication(s)/side effects and non-pharmacologic comfort measures 01/09/2024 0006 by Lennie Rodgers BIRCH, RN Outcome: Progressing 01/09/2024 0006 by Lennie Rodgers BIRCH, RN Outcome: Progressing   Problem: Health Behavior/Discharge Planning: Goal: Ability to manage health-related needs will improve 01/09/2024 0006 by Lennie Rodgers BIRCH, RN Outcome: Progressing 01/09/2024 0006 by Lennie Rodgers BIRCH, RN Outcome: Progressing   Problem: Clinical Measurements: Goal: Ability to maintain clinical measurements within normal limits will improve 01/09/2024 0006 by Lennie Rodgers BIRCH, RN Outcome: Progressing 01/09/2024 0006 by Lennie Rodgers BIRCH, RN Outcome: Progressing Goal: Will remain free from infection 01/09/2024 0006 by Lennie Rodgers BIRCH, RN Outcome: Progressing 01/09/2024 0006 by Lennie Rodgers BIRCH, RN Outcome: Progressing Goal: Diagnostic test results will improve 01/09/2024 0006 by Lennie Rodgers BIRCH, RN Outcome: Progressing 01/09/2024 0006 by Lennie Rodgers BIRCH, RN Outcome: Progressing Goal: Respiratory complications will improve 01/09/2024 0006 by Lennie Rodgers BIRCH, RN Outcome: Progressing 01/09/2024 0006 by Lennie Rodgers BIRCH, RN Outcome: Progressing Goal: Cardiovascular complication will be avoided 01/09/2024 0006 by Lennie Rodgers BIRCH, RN Outcome: Progressing 01/09/2024 0006 by Lennie Rodgers BIRCH, RN Outcome: Progressing   Problem: Activity: Goal: Risk for activity intolerance will decrease 01/09/2024 0006 by Lennie Rodgers BIRCH, RN Outcome: Progressing 01/09/2024 0006 by Lennie Rodgers BIRCH, RN Outcome: Progressing   Problem: Nutrition: Goal: Adequate nutrition will be maintained 01/09/2024 0006 by Lennie Rodgers BIRCH, RN Outcome: Progressing 01/09/2024 0006 by Lennie Rodgers BIRCH, RN Outcome: Progressing   Problem: Coping: Goal: Level of anxiety will decrease 01/09/2024  0006 by Lennie Rodgers BIRCH, RN Outcome: Progressing 01/09/2024 0006 by Lennie Rodgers BIRCH, RN Outcome: Progressing   Problem: Elimination: Goal: Will not experience complications related to bowel motility 01/09/2024 0006 by Lennie Rodgers BIRCH, RN Outcome: Progressing 01/09/2024 0006 by Lennie Rodgers BIRCH, RN Outcome: Progressing Goal: Will not experience complications related to urinary retention 01/09/2024 0006 by Lennie Rodgers BIRCH, RN Outcome: Progressing 01/09/2024 0006 by Lennie Rodgers BIRCH, RN Outcome: Progressing   Problem: Pain Managment: Goal: General experience of comfort will improve and/or be controlled 01/09/2024 0006 by Lennie Rodgers BIRCH, RN Outcome: Progressing 01/09/2024 0006 by Lennie Rodgers BIRCH, RN Outcome: Progressing   Problem: Safety: Goal: Ability to remain free from injury will improve 01/09/2024 0006 by Lennie Rodgers BIRCH, RN Outcome: Progressing 01/09/2024 0006 by Lennie Rodgers BIRCH, RN Outcome: Progressing   Problem: Skin Integrity: Goal: Risk for impaired skin integrity will decrease 01/09/2024 0006 by Lennie Rodgers BIRCH, RN Outcome: Progressing 01/09/2024 0006 by Lennie Rodgers BIRCH, RN Outcome: Progressing

## 2024-01-09 NOTE — Procedures (Signed)
 PROCEDURE SUMMARY:  Successful image-guided diagnostic and therapeutic thoracentesis from the right chest.  Yielded 600 mL of hazy amber fluid.  No immediate complications.  EBL: zero Patient tolerated well.   Specimen sent for labs.  Post-procedure CXR ordered.   Please see imaging section of Epic for full dictation.  Crist Kruszka B Tahira Olivarez NP 01/09/2024 9:14 AM

## 2024-01-09 NOTE — Progress Notes (Signed)
 Patient tolerated right Thoracentesis procedure well today and 600 mL of serosanguinous colored fluid removed with labs collected and sent for processing. Patient verbalized understanding of post procedure instructions and transported via stretcher to xray at this time for post chest xray with no acute distress noted.

## 2024-01-09 NOTE — TOC Progression Note (Signed)
 Transition of Care [facility]) - Progression Note    Patient Details  Name: Emily Rocha MRN: [id number] Date of Birth: May 10, 1955  Transition of Care [facility]) CM/SW Contact  Lucie Lunger, [place] Phone Number: 01/09/2024, 2:32 PM  Clinical Narrative:    CSW updated that pt has elected to go home with hospice at home services through [facility]. CSW spoke to [place] with [facility] to give referral, they followed up with pts family and will send RN to home once pt discharges home. TOC to follow.   Expected Discharge Plan: Home w Home Health Services Barriers to Discharge: Barriers Resolved               Expected Discharge Plan and Services In-house Referral: Clinical Social Work Discharge Planning Services: NA Post Acute Care Choice: Home Health Living arrangements for the past 2 months: Single Family Home                           HH Arranged: PT HH Agency: [facility] Date [facility] Agency Contacted: 01/09/24   Representative spoke with at [facility] Agency: Darleene   Social Drivers of Health (SDOH) Interventions SDOH Screenings   Food Insecurity: No Food Insecurity (01/07/2024)  Housing: Low Risk  (01/07/2024)  Transportation Needs: No Transportation Needs (01/07/2024)  Utilities: Not At Risk (01/07/2024)  Depression (PHQ2-9): Low Risk  (10/28/2020)  Social Connections: Socially Integrated (01/07/2024)  Recent Concern: Social Connections - Moderately Isolated (01/03/2024)  Tobacco Use: Medium Risk (01/09/2024)    Readmission Risk Interventions    01/08/2024    9:12 AM 12/09/2023   11:48 AM  Readmission Risk Prevention Plan  Medication Screening  Complete  Transportation Screening Complete Complete  PCP or Specialist Appt within 3-5 Days Complete   HRI or Home Care Consult Complete   Social Work Consult for Recovery Care Planning/Counseling Complete   Palliative Care Screening Complete   Medication Review Oceanographer) Complete

## 2024-01-09 NOTE — Consult Note (Signed)
 Consultation Note Date: 01/09/2024   Patient Name: Emily Rocha  DOB: [birth year]  MRN: [id number]  Age / Sex: 68 y.o., female  PCP: Rosamond Leta NOVAK, MD Referring Physician: Maree Adron BIRCH, DO  Reason for Consultation: Establishing goals of care  HPI/Patient Profile: 68 y.o. female  with past medical history of COPD, asthma, CAD, PVD, prior TAVR, afib, HFpEF, cor pulmonale, chronic 5L oxygen , HDL, GERD, anxiety, recent admission with acute heart failure and COPD exacerbations and concern for GIB but declined endoscopy. Admitted on 01/07/2024 with acute on chronic respiratory failure with worsening loculation pleural effusion. S/P R thoracentesis 600 mL removed. Previously referred to [facility] outpatient palliative care.   Clinical Assessment and Goals of Care: Consult received and chart review completed. I met today with [name]. No family at bedside. She is awake and interactive.She sits up on side of bed to speak with me. She is extremely hard of hearing but is able to understand me speaking loudly into her right ear.   I introduced myself from palliative care and palliative role. She asks if this is like hospice. I share that this is something we sometimes discuss with people as an option if indicated. I asked [name] to share with me her understanding and her feelings about her health condition. She shares openly that she knows her lungs cannot manage the fluid from her heart and she knows this is not going to get any better. She tells me I'm tired and I don't want to suffer. We discussed more about what this means. I did share that based on what she is sharing with me I do feel that hospice assistance at home would be helpful to her. We discussed the benefits of hospice support. She is very much in agreement with having hospice at home. She tells me that her brother had help from hospice. She also shares that she  does not want to die at home. She says she doesn't want her husband to have to wipe my butt. She wants dignity and comfort. We discussed hospice at home and hospice help when it may be indicated to transition to hospice facility for end of life care. SHe agrees. She gives me permission to call and discuss with her husband and her son. She shares that her husband had a stroke not long ago and although he is good most of the time this can effect if he answers the phone and certain things. I shared that I will reach out to both of them to discuss.   I was able to discuss with husband Emily Rocha as well as so Emily Rocha separately via phone. I explained my conversation with [name]. They both confirm that she has shared the same with them and they both agree they do not want her to suffer. I explained to them about hospice support and how they can help. We discussed [name]'s wishes not to die at home as well. All family confirm that plan for home hospice support is aligned with care Ms.  Emily Rocha has shared that she desires.   All questions/concerns addressed. Emotional support provided. Discussed with Dr. Maree, TOC.   Primary Decision Maker PATIENT    SUMMARY OF RECOMMENDATIONS   - DNR/DNI - Home with hospice support  Code Status/Advance Care Planning: DNR   Symptom Management:  PRN Roxanol - could consider scheduled low dose.   Prognosis:  Poor - likely weeks or less but with good hospice support and symptom management could be a little longer.   Discharge Planning: Home with Hospice      Primary Diagnoses: Present on Admission:  Acute hypercapnic respiratory failure (HCC)   I have reviewed the medical record, interviewed the patient and family, and examined the patient. The following aspects are pertinent.  Past Medical History:  Diagnosis Date   Anxiety    Asthma    COPD (chronic obstructive pulmonary disease) (HCC)    Coronary artery disease    a. s/p NSTEMI in 11/2016 and required  CABG with LIMA-LAD, SVG-LCx and SVG-D1 and complicated by cardiogenic shock b. cath in 05/2020 showing severe RCA stenosis and treated with orbital atherectomy and stent placement   GERD (gastroesophageal reflux disease)    History of CVA (cerebrovascular accident)    History of lung cancer 2020   Peripheral vascular disease    S/P TAVR (transcatheter aortic valve replacement) 08/12/2020   s/p TAVR with a 23 mm Edwards S3U via the TF approach by Dr. Verlin & Dr. Lucas   Severe aortic stenosis    Social History   Socioeconomic History   Marital status: Married    Spouse name: Not on file   Number of children: Not on file   Years of education: Not on file   Highest education level: Not on file  Occupational History   Not on file  Tobacco Use   Smoking status: Former    Current packs/day: 0.00    Types: Cigarettes    Start date: 03/28/1982    Quit date: 08/05/2023    Years since quitting: 0.4   Smokeless tobacco: Never   Tobacco comments:    Patient floretta again smoking August 05, 2023.   Vaping Use   Vaping status: Former   Start date: 10/23/2013   Quit date: 10/24/2015  Substance and Sexual Activity   Alcohol  use: No    Alcohol /week: 0.0 standard drinks of alcohol    Drug use: No   Sexual activity: Not on file  Other Topics Concern   Not on file  Social History Narrative   Not on file   Social Drivers of Health   Financial Resource Strain: Not on file  Food Insecurity: No Food Insecurity (01/07/2024)   Hunger Vital Sign    Worried About Running Out of Food in the Last Year: Never true    Ran Out of Food in the Last Year: Never true  Transportation Needs: No Transportation Needs (01/07/2024)   PRAPARE - Administrator, Civil Service (Medical): No    Lack of Transportation (Non-Medical): No  Physical Activity: Not on file  Stress: Not on file  Social Connections: Socially Integrated (01/07/2024)   Social Connection and Isolation Panel    Frequency of  Communication with Friends and Family: More than three times a week    Frequency of Social Gatherings with Friends and Family: More than three times a week    Attends Religious Services: More than 4 times per year    Active Member of [employer] or Organizations: Yes  Attends Banker Meetings: More than 4 times per year    Marital Status: Married  Recent Concern: Social Connections - Moderately Isolated (01/03/2024)   Social Connection and Isolation Panel    Frequency of Communication with Friends and Family: More than three times a week    Frequency of Social Gatherings with Friends and Family: More than three times a week    Attends Religious Services: Never    Database Administrator or Organizations: No    Attends Engineer, Structural: Never    Marital Status: Married   Family History  Problem Relation Age of Onset   Heart attack Mother    Cancer Mother    Diabetes Mother    Heart disease Mother    Heart attack Father    Heart disease Father    Scheduled Meds:  aspirin  EC  81 mg Oral Daily   atorvastatin   40 mg Oral Daily   Chlorhexidine  Gluconate Cloth  6 each Topical Q0600   furosemide   40 mg Intravenous Q12H   magnesium  oxide  400 mg Oral Daily   metoprolol  succinate  25 mg Oral QHS   midodrine   5 mg Oral TID WC   pantoprazole   40 mg Oral BID   potassium chloride  SA  20 mEq Oral Daily   predniSONE   40 mg Oral Q breakfast   Continuous Infusions: PRN Meds:.acetaminophen  **OR** acetaminophen , ALPRAZolam , diphenhydrAMINE , hydrALAZINE , ipratropium-albuterol , ondansetron  **OR** ondansetron  (ZOFRAN ) IV, mouth rinse, oxyCODONE  Allergies  Allergen Reactions   Levaquin  [Levofloxacin ] Swelling   Review of Systems  Constitutional:  Positive for activity change.  Respiratory:  Positive for shortness of breath.     Physical Exam Vitals and nursing note reviewed.  Constitutional:      General: She is not in acute distress.    Appearance: She is  ill-appearing.  Cardiovascular:     Rate and Rhythm: Normal rate.  Pulmonary:     Effort: No tachypnea, accessory muscle usage or respiratory distress.     Comments: No dyspnea at rest Neurological:     Mental Status: She is alert and oriented to person, place, and time.     Comments: Completely oriented but severely hard of hearing     Vital Signs: BP (!) 121/46 (BP Location: Right Arm)   Pulse 83   Temp 98.4 F (36.9 C) (Oral)   Resp (!) 22   Ht 5' 5 (1.651 m)   Wt 57.3 kg   SpO2 95%   BMI 21.02 kg/m  Pain Scale: 0-10   Pain Score: 8    SpO2: SpO2: 95 % O2 Device:SpO2: 95 % O2 Flow Rate: .O2 Flow Rate (L/min): 6 L/min  IO: Intake/output summary:  Intake/Output Summary (Last 24 hours) at 01/09/2024 9077 Last data filed at 01/08/2024 2035 Gross per 24 hour  Intake 480 ml  Output 450 ml  Net 30 ml    LBM: Last BM Date : 01/07/24 Baseline Weight: Weight: 57.9 kg Most recent weight: Weight: 57.3 kg     Palliative Assessment/Data:    Time Total: 80 min  Greater than 50%  of this time was spent counseling and coordinating care related to the above assessment and plan.  Signed by: Bernarda Kitty, NP Palliative Medicine Team Pager # [phone] (M-F 8a-5p) Team Phone # [phone] (Nights/Weekends)

## 2024-01-09 NOTE — Plan of Care (Signed)

## 2024-01-09 NOTE — Plan of Care (Signed)
  Problem: Education: Goal: Knowledge of General Education information will improve Description: Including pain rating scale, medication(s)/side effects and non-pharmacologic comfort measures 01/09/2024 0013 by Lennie Rodgers BIRCH, RN Outcome: Progressing 01/09/2024 0006 by Lennie Rodgers BIRCH, RN Outcome: Progressing 01/09/2024 0006 by Lennie Rodgers BIRCH, RN Outcome: Progressing   Problem: Health Behavior/Discharge Planning: Goal: Ability to manage health-related needs will improve 01/09/2024 0013 by Lennie Rodgers BIRCH, RN Outcome: Progressing 01/09/2024 0006 by Lennie Rodgers BIRCH, RN Outcome: Progressing 01/09/2024 0006 by Lennie Rodgers BIRCH, RN Outcome: Progressing   Problem: Clinical Measurements: Goal: Ability to maintain clinical measurements within normal limits will improve 01/09/2024 0013 by Lennie Rodgers BIRCH, RN Outcome: Progressing 01/09/2024 0006 by Lennie Rodgers BIRCH, RN Outcome: Progressing 01/09/2024 0006 by Lennie Rodgers BIRCH, RN Outcome: Progressing Goal: Will remain free from infection 01/09/2024 0013 by Lennie Rodgers BIRCH, RN Outcome: Progressing 01/09/2024 0006 by Lennie Rodgers BIRCH, RN Outcome: Progressing 01/09/2024 0006 by Lennie Rodgers BIRCH, RN Outcome: Progressing Goal: Diagnostic test results will improve 01/09/2024 0013 by Lennie Rodgers BIRCH, RN Outcome: Progressing 01/09/2024 0006 by Lennie Rodgers BIRCH, RN Outcome: Progressing 01/09/2024 0006 by Lennie Rodgers BIRCH, RN Outcome: Progressing Goal: Respiratory complications will improve 01/09/2024 0013 by Lennie Rodgers BIRCH, RN Outcome: Progressing 01/09/2024 0006 by Lennie Rodgers BIRCH, RN Outcome: Progressing 01/09/2024 0006 by Lennie Rodgers BIRCH, RN Outcome: Progressing Goal: Cardiovascular complication will be avoided 01/09/2024 0013 by Lennie Rodgers BIRCH, RN Outcome: Progressing 01/09/2024 0006 by Lennie Rodgers BIRCH, RN Outcome: Progressing 01/09/2024 0006 by Lennie Rodgers BIRCH, RN Outcome: Progressing   Problem:  Activity: Goal: Risk for activity intolerance will decrease 01/09/2024 0013 by Lennie Rodgers BIRCH, RN Outcome: Progressing 01/09/2024 0006 by Lennie Rodgers BIRCH, RN Outcome: Progressing 01/09/2024 0006 by Lennie Rodgers BIRCH, RN Outcome: Progressing   Problem: Nutrition: Goal: Adequate nutrition will be maintained 01/09/2024 0013 by Lennie Rodgers BIRCH, RN Outcome: Progressing 01/09/2024 0006 by Lennie Rodgers BIRCH, RN Outcome: Progressing 01/09/2024 0006 by Lennie Rodgers BIRCH, RN Outcome: Progressing   Problem: Coping: Goal: Level of anxiety will decrease 01/09/2024 0013 by Lennie Rodgers BIRCH, RN Outcome: Progressing 01/09/2024 0006 by Lennie Rodgers BIRCH, RN Outcome: Progressing 01/09/2024 0006 by Lennie Rodgers BIRCH, RN Outcome: Progressing   Problem: Elimination: Goal: Will not experience complications related to bowel motility 01/09/2024 0013 by Lennie Rodgers BIRCH, RN Outcome: Progressing 01/09/2024 0006 by Lennie Rodgers BIRCH, RN Outcome: Progressing 01/09/2024 0006 by Lennie Rodgers BIRCH, RN Outcome: Progressing Goal: Will not experience complications related to urinary retention 01/09/2024 0013 by Lennie Rodgers BIRCH, RN Outcome: Progressing 01/09/2024 0006 by Lennie Rodgers BIRCH, RN Outcome: Progressing 01/09/2024 0006 by Lennie Rodgers BIRCH, RN Outcome: Progressing   Problem: Pain Managment: Goal: General experience of comfort will improve and/or be controlled 01/09/2024 0013 by Lennie Rodgers BIRCH, RN Outcome: Progressing 01/09/2024 0006 by Lennie Rodgers BIRCH, RN Outcome: Progressing 01/09/2024 0006 by Lennie Rodgers BIRCH, RN Outcome: Progressing   Problem: Safety: Goal: Ability to remain free from injury will improve 01/09/2024 0013 by Lennie Rodgers BIRCH, RN Outcome: Progressing 01/09/2024 0006 by Lennie Rodgers BIRCH, RN Outcome: Progressing 01/09/2024 0006 by Lennie Rodgers BIRCH, RN Outcome: Progressing   Problem: Skin Integrity: Goal: Risk for impaired skin integrity will decrease 01/09/2024  0013 by Lennie Rodgers BIRCH, RN Outcome: Progressing 01/09/2024 0006 by Lennie Rodgers BIRCH, RN Outcome: Progressing 01/09/2024 0006 by Lennie Rodgers BIRCH, RN Outcome: Progressing

## 2024-01-09 NOTE — TOC Transition Note (Signed)
 Transition of Care [facility]) - Discharge Note   Patient Details  Name: Emily Rocha MRN: [id number] Date of Birth: May 12, 1955  Transition of Care [facility]) CM/SW Contact:  Lucie Lunger, LCSWA Phone Number: 01/09/2024, 10:41 AM  Clinical Narrative:    CSW updated that pt may be able to D/C later today. CSW updated Darleene with [facility] to update that pt will D/C home today. MD placed HH orders. Pt has home O2 with [employer]. TOC signing off.   Final next level of care: Home w Home Health Services Barriers to Discharge: Barriers Resolved   Patient Goals and CMS Choice Patient states their goals for this hospitalization and ongoing recovery are:: return home CMS Medicare.gov Compare Post Acute Care list provided to:: Patient Choice offered to / list presented to : Patient      Discharge Placement                       Discharge Plan and Services Additional resources added to the After Visit Summary for   In-house Referral: Clinical Social Work Discharge Planning Services: NA Post Acute Care Choice: Home Health                    HH Arranged: PT [facility] Agency: [facility] Date [facility] Agency Contacted: 01/09/24   Representative spoke with at [facility] Agency: Darleene  Social Drivers of Health (SDOH) Interventions SDOH Screenings   Food Insecurity: No Food Insecurity (01/07/2024)  Housing: Low Risk  (01/07/2024)  Transportation Needs: No Transportation Needs (01/07/2024)  Utilities: Not At Risk (01/07/2024)  Depression (PHQ2-9): Low Risk  (10/28/2020)  Social Connections: Socially Integrated (01/07/2024)  Recent Concern: Social Connections - Moderately Isolated (01/03/2024)  Tobacco Use: Medium Risk (01/09/2024)     Readmission Risk Interventions    01/08/2024    9:12 AM 12/09/2023   11:48 AM  Readmission Risk Prevention Plan  Medication Screening  Complete  Transportation Screening Complete Complete  PCP or Specialist Appt within 3-5 Days Complete   HRI or Home Care  Consult Complete   Social Work Consult for Recovery Care Planning/Counseling Complete   Palliative Care Screening Complete   Medication Review Oceanographer) Complete

## 2024-01-09 NOTE — Progress Notes (Signed)
 PROGRESS NOTE    Emily Rocha  FMW:[id number] DOB: [birth year] DOA: 01/07/2024 PCP: Rosamond Leta NOVAK, MD   Brief Narrative:    Emily Rocha is a 68 y.o. female with medical history significant for COPD/asthma, CAD, PVD, prior TAVR, atrial fibrillation, diastolic CHF with chronic cor pulmonale, dyslipidemia, GERD, and anxiety disorder who presented to the ED with worsening of shortness of breath who was just discharged on 11/21 after admission for acute on chronic hypoxemic/hypercapnic respiratory failure secondary to acute on chronic diastolic CHF exacerbation as well as acute COPD exacerbation.  She has now been readmitted with worsening dyspnea likely related to loculated pleural effusion and will require thoracentesis on 11/24.  She has been started on IV diuresis in the interim.  She underwent thoracentesis on 11/24 with 600 mL of fluid removed and further fluid analysis pending.  She continues to remain short of breath and requires further aggressive diuresis as well as treatment of her respiratory status.  Palliative consultation pending.  Assessment & Plan:   Principal Problem:   Acute hypercapnic respiratory failure (HCC)  Assessment and Plan:   Acute on chronic hypoxemic/hypercapnic respiratory failure-likely due to worsening loculated pleural effusion -Wears 5 L nasal cannula at baseline, currently on 6 L and tachypneic -No current need for Bipap - Continue oral prednisone  -Continue now on IV Lasix  increased to 80 mg twice daily on 11/24 - Thoracentesis with 600 mL fluid removed on 11/24 so -Xanax  prn anxiety -Palliative care for goals of care discussion while hospitalized as she was to see outpatient palliative soon.   CAD/prior CABG - Underwent recent cardiac catheterization on 11/3 - 2D echocardiogram with LVEF 55-60% - Denies any current chest pain   Acute blood loss anemia-stable -Likely in the setting of GI bleed with ongoing Eliquis  use -Positive FOBT noted -  Hemoglobin currently stable, monitor on ASA 81mg  daily and no further Eliquis  -Continue on PPI BID   Atrial fibrillation-currently in SR - Currently rate controlled - Held Eliquis  as noted on recent discharge and now on ASA 81mg  daily - Metoprolol  XL to bisoprolol  - Monitor on telemetry, okay for telemetry floor   Dyslipidemia - Atorvastatin    GERD - PPI   Anxiety - Alprazolam  prn anxiety    DVT prophylaxis: SCDs Code Status: DNR Family Communication: None at bedside Disposition Plan:  Status is: Inpatient Remains inpatient appropriate because: Need for IV medications and thoracentesis.  Consultants:  IR Palliative care  Procedures:  Thoracentesis with 600 mL fluid removed on 11/24  Antimicrobials:  None   Subjective: Patient seen and evaluated today with ongoing dyspnea noted despite thoracentesis this morning.  She overall feels quite fatigued.  Objective: Vitals:   01/09/24 0738 01/09/24 0846 01/09/24 0900 01/09/24 0915  BP: (!) 114/57 (!) 101/49 (!) 89/52 (!) 121/46  Pulse: 80 87 87 83  Resp: 18 (!) 22 (!) 22 (!) 22  Temp: 98.5 F (36.9 C) 98.4 F (36.9 C)    TempSrc: Oral Oral    SpO2: 97% 99% 96% 95%  Weight:      Height:        Intake/Output Summary (Last 24 hours) at 01/09/2024 1112 Last data filed at 01/09/2024 1011 Gross per 24 hour  Intake 720 ml  Output 450 ml  Net 270 ml   Filed Weights   01/08/24 0355 01/09/24 0538  Weight: 57.9 kg 57.3 kg    Examination:  General exam: Appears calm and comfortable, very hard of hearing Respiratory system: Clear to auscultation.  Respiratory effort normal.  6 L nasal cannula Cardiovascular system: S1 & S2 heard, RRR.  Gastrointestinal system: Abdomen is soft Central nervous system: Alert and awake Extremities: No edema Skin: No significant lesions noted Psychiatry: Flat affect.    Data Reviewed: I have personally reviewed following labs and imaging studies  CBC: Recent Labs  Lab  01/03/24 1233 01/04/24 0515 01/05/24 0416 01/05/24 1719 01/06/24 0434 01/07/24 1019 01/08/24 0343 01/09/24 0405  WBC 16.2*   < > 9.8  --  9.5 9.3 6.8 7.8  NEUTROABS 14.4*  --   --   --   --  6.5  --   --   HGB 8.3*   < > 7.7* 8.1* 7.6* 8.4* 7.6* 7.4*  HCT 29.3*   < > 26.5* 28.2* 25.7* 29.2* 26.2* 25.8*  MCV 100.0   < > 98.1  --  98.1 99.0 98.1 98.5  PLT 167   < > 171  --  179 215 173 183   < > = values in this interval not displayed.   Basic Metabolic Panel: Recent Labs  Lab 01/04/24 0515 01/05/24 0416 01/06/24 0434 01/07/24 1019 01/08/24 1149 01/09/24 0405  NA 141 140 140 139 138 138  K 4.3 4.3 3.7 4.3 4.5 4.3  CL 94* 92* 90* 89* 86* 88*  CO2 44* >45* >45* >45* >45* >45*  GLUCOSE 122* 160* 92 77 103* 101*  BUN 14 20 17  24* 27* 29*  CREATININE 0.58 0.64 0.50 0.62 0.70 0.70  CALCIUM  8.9 9.3 9.3 9.8 9.4 9.3  MG 2.2 2.2 2.2  --  2.3 2.4   GFR: Estimated Creatinine Clearance: 58.9 mL/min (by C-G formula based on SCr of 0.7 mg/dL). Liver Function Tests: Recent Labs  Lab 01/03/24 1233 01/07/24 1019  AST 22 35  ALT 11 19  ALKPHOS 138* 122  BILITOT 0.7 1.3*  PROT 6.5 6.8  ALBUMIN  3.9 4.0   No results for input(s): LIPASE, AMYLASE in the last 168 hours. No results for input(s): AMMONIA in the last 168 hours. Coagulation Profile: No results for input(s): INR, PROTIME in the last 168 hours. Cardiac Enzymes: No results for input(s): CKTOTAL, CKMB, CKMBINDEX, TROPONINI in the last 168 hours. BNP (last 3 results) Recent Labs    12/16/23 0455 01/03/24 1233 01/07/24 1019  PROBNP 3,285.0* 4,206.0* 13,727.0*   HbA1C: No results for input(s): HGBA1C in the last 72 hours. CBG: No results for input(s): GLUCAP in the last 168 hours. Lipid Profile: No results for input(s): CHOL, HDL, LDLCALC, TRIG, CHOLHDL, LDLDIRECT in the last 72 hours. Thyroid  Function Tests: No results for input(s): TSH, T4TOTAL, FREET4, T3FREE, THYROIDAB  in the last 72 hours. Anemia Panel: No results for input(s): VITAMINB12, FOLATE, FERRITIN, TIBC, IRON , RETICCTPCT in the last 72 hours. Sepsis Labs: No results for input(s): PROCALCITON, LATICACIDVEN in the last 168 hours.  Recent Results (from the past 240 hours)  Resp panel by RT-PCR (RSV, Flu A&B, Covid) Anterior Nasal Swab     Status: None   Collection Time: 01/03/24 12:53 PM   Specimen: Anterior Nasal Swab  Result Value Ref Range Status   SARS Coronavirus 2 by RT PCR NEGATIVE NEGATIVE Final    Comment: (NOTE) SARS-CoV-2 target nucleic acids are NOT DETECTED.  The SARS-CoV-2 RNA is generally detectable in upper respiratory specimens during the acute phase of infection. The lowest concentration of SARS-CoV-2 viral copies this assay can detect is 138 copies/mL. A negative result does not preclude SARS-Cov-2 infection and should not be used as the  sole basis for treatment or other patient management decisions. A negative result may occur with  improper specimen collection/handling, submission of specimen other than nasopharyngeal swab, presence of viral mutation(s) within the areas targeted by this assay, and inadequate number of viral copies(<138 copies/mL). A negative result must be combined with clinical observations, patient history, and epidemiological information. The expected result is Negative.  Fact Sheet for Patients:  bloggercourse.com  Fact Sheet for Healthcare Providers:  seriousbroker.it  This test is no t yet approved or cleared by the [place]  FDA and  has been authorized for detection and/or diagnosis of SARS-CoV-2 by FDA under an Emergency Use Authorization (EUA). This EUA will remain  in effect (meaning this test can be used) for the duration of the COVID-19 declaration under Section 564(b)(1) of the Act, 21 U.S.C.section 360bbb-3(b)(1), unless the authorization is terminated  or revoked  sooner.       Influenza A by PCR NEGATIVE NEGATIVE Final   Influenza B by PCR NEGATIVE NEGATIVE Final    Comment: (NOTE) The Xpert Xpress SARS-CoV-2/FLU/RSV plus assay is intended as an aid in the diagnosis of influenza from Nasopharyngeal swab specimens and should not be used as a sole basis for treatment. Nasal washings and aspirates are unacceptable for Xpert Xpress SARS-CoV-2/FLU/RSV testing.  Fact Sheet for Patients: bloggercourse.com  Fact Sheet for Healthcare Providers: seriousbroker.it  This test is not yet approved or cleared by the [place]  FDA and has been authorized for detection and/or diagnosis of SARS-CoV-2 by FDA under an Emergency Use Authorization (EUA). This EUA will remain in effect (meaning this test can be used) for the duration of the COVID-19 declaration under Section 564(b)(1) of the Act, 21 U.S.C. section 360bbb-3(b)(1), unless the authorization is terminated or revoked.     Resp Syncytial Virus by PCR NEGATIVE NEGATIVE Final    Comment: (NOTE) Fact Sheet for Patients: bloggercourse.com  Fact Sheet for Healthcare Providers: seriousbroker.it  This test is not yet approved or cleared by the [place]  FDA and has been authorized for detection and/or diagnosis of SARS-CoV-2 by FDA under an Emergency Use Authorization (EUA). This EUA will remain in effect (meaning this test can be used) for the duration of the COVID-19 declaration under Section 564(b)(1) of the Act, 21 U.S.C. section 360bbb-3(b)(1), unless the authorization is terminated or revoked.  Performed at [facility], [address], [place], [place] [address]   MRSA Next Gen by PCR, Nasal     Status: None   Collection Time: 01/03/24  4:17 PM   Specimen: Nasal Mucosa; Nasal Swab  Result Value Ref Range Status   MRSA by PCR Next Gen NOT DETECTED NOT DETECTED Final    Comment:  (NOTE) The GeneXpert MRSA Assay (FDA approved for NASAL specimens only), is one component of a comprehensive MRSA colonization surveillance program. It is not intended to diagnose MRSA infection nor to guide or monitor treatment for MRSA infections. Test performance is not FDA approved in patients less than 104 years old. Performed at [facility], [address], [place], [place] [address]   Culture, body fluid w Gram Stain-bottle     Status: None (Preliminary result)   Collection Time: 01/09/24  9:10 AM   Specimen: Pleura  Result Value Ref Range Status   Specimen Description PLEURAL BOTTLES DRAWN AEROBIC AND ANAEROBIC  Final   Special Requests   Final    10CC Performed at [facility], [address] [place], [place] [address]    Culture PENDING  Incomplete  Report Status PENDING  Incomplete  Gram stain     Status: None   Collection Time: 01/09/24  9:10 AM   Specimen: Pleura  Result Value Ref Range Status   Specimen Description PLEURAL  Final   Special Requests PLEURAL  Final   Gram Stain   Final    PLEURAL CYTOSPIN SMEAR NO ORGANISMS SEEN WBC PRESENT,BOTH PMN AND MONONUCLEAR Performed at [facility], [address], [place], [place] [address]    Report Status 01/09/2024 FINAL  Final         Radiology Studies: US  THORACENTESIS ASP PLEURAL SPACE W/IMG GUIDE Result Date: 01/09/2024 INDICATION: 68 year old female presents with shortness of breath and right pleural effusion. Received request for diagnostic and therapeutic thoracentesis. EXAM: ULTRASOUND GUIDED DIAGNOSTIC AND THERAPEUTIC, RIGHT-SIDED THORACENTESIS MEDICATIONS: 10 mL 1% lidocaine  COMPLICATIONS: None immediate. PROCEDURE: An ultrasound guided thoracentesis was thoroughly discussed with the patient and questions answered. The benefits, risks, alternatives and complications were also discussed. The patient understands and wishes to proceed with the procedure. Written consent was obtained. Ultrasound was performed to  localize and mark an adequate pocket of fluid in the right chest. The area was then prepped and draped in the normal sterile fashion. 1% lidocaine  was used for local anesthesia. Under ultrasound guidance a 6 Fr Safe-T-Centesis catheter was introduced. Thoracentesis was performed. The catheter was removed and a dressing applied. FINDINGS: A total of approximately 600 mL of hazy amber fluid was removed. Samples were sent to the laboratory as requested by the clinical team. IMPRESSION: Successful ultrasound guided right thoracentesis yielding 600 mL of pleural fluid. Performed by: Kristi Davenport, NP under the direct supervision of Dr. Wilkie Lent Electronically Signed   By: Wilkie Lent M.D.   On: 01/09/2024 11:06   DG Chest 1 View Result Date: 01/09/2024 CLINICAL DATA:  Status post thoracentesis. EXAM: CHEST  1 VIEW COMPARISON:  01/07/2024 FINDINGS: The cardio pericardial silhouette is enlarged. Vascular congestion with diffuse interstitial opacity suggests edema. Interval decrease in right pleural effusion with persistent right greater than left basilar atelectasis or infiltrate. No evidence for pneumothorax. Telemetry leads overlie the chest. IMPRESSION: Interval decrease in right pleural effusion with persistent right greater than left basilar atelectasis or infiltrate. No evidence for pneumothorax. Electronically Signed   By: Camellia Candle M.D.   On: 01/09/2024 09:49        Scheduled Meds:  aspirin  EC  81 mg Oral Daily   atorvastatin   40 mg Oral Daily   bisoprolol   2.5 mg Oral Daily   budesonide  (PULMICORT ) nebulizer solution  0.25 mg Nebulization BID   Chlorhexidine  Gluconate Cloth  6 each Topical Q0600   furosemide   80 mg Intravenous Q12H   ipratropium-albuterol   3 mL Nebulization Q6H   magnesium  oxide  400 mg Oral Daily   midodrine   5 mg Oral TID WC   pantoprazole   40 mg Oral BID   potassium chloride  SA  20 mEq Oral Daily   predniSONE   40 mg Oral Q breakfast     LOS: 2 days     Time spent: 55 minutes    Emily Madding JONETTA Fairly, DO [facility]  If 7PM-7AM, please contact night-coverage [email or web address] 01/09/2024, 11:12 AM

## 2024-01-10 ENCOUNTER — Ambulatory Visit: Admitting: Student

## 2024-01-10 DIAGNOSIS — Z515 Encounter for palliative care: Secondary | ICD-10-CM | POA: Diagnosis not present

## 2024-01-10 DIAGNOSIS — Z7189 Other specified counseling: Secondary | ICD-10-CM | POA: Diagnosis not present

## 2024-01-10 DIAGNOSIS — J9602 Acute respiratory failure with hypercapnia: Secondary | ICD-10-CM | POA: Diagnosis not present

## 2024-01-10 LAB — CBC
HCT: 26 % — ABNORMAL LOW (ref 36.0–46.0)
Hemoglobin: 7.5 g/dL — ABNORMAL LOW (ref 12.0–15.0)
MCH: 28.2 pg (ref 26.0–34.0)
MCHC: 28.8 g/dL — ABNORMAL LOW (ref 30.0–36.0)
MCV: 97.7 fL (ref 80.0–100.0)
Platelets: 209 K/uL (ref 150–400)
RBC: 2.66 MIL/uL — ABNORMAL LOW (ref 3.87–5.11)
RDW: 15.1 % (ref 11.5–15.5)
WBC: 9.1 K/uL (ref 4.0–10.5)
nRBC: 0 % (ref 0.0–0.2)

## 2024-01-10 LAB — BASIC METABOLIC PANEL WITH GFR
BUN: 26 mg/dL — ABNORMAL HIGH (ref 8–23)
CO2: >45 mmol/L — ABNORMAL HIGH (ref 22–32)
Calcium: 9.3 mg/dL (ref 8.9–10.3)
Chloride: 87 mmol/L — ABNORMAL LOW (ref 98–111)
Creatinine, Ser: 0.67 mg/dL (ref 0.44–1.00)
GFR, Estimated: >60 mL/min (ref >60)
Glucose, Bld: 78 mg/dL (ref 70–99)
Potassium: 4.2 mmol/L (ref 3.5–5.1)
Sodium: 139 mmol/L (ref 135–145)

## 2024-01-10 LAB — MAGNESIUM: Magnesium: 2.2 mg/dL (ref 1.7–2.4)

## 2024-01-10 LAB — PATHOLOGIST SMEAR REVIEW: Path Review: REACTIVE

## 2024-01-10 MED ORDER — BISOPROLOL FUMARATE 2.5 MG PO TABS: 2.5000 mg | ORAL_TABLET | Freq: Every day | ORAL | 0 refills | Status: DC

## 2024-01-10 MED ORDER — MORPHINE SULFATE (CONCENTRATE) 10 MG /0.5 ML PO SOLN
2.5000 mg | Freq: Three times a day (TID) | ORAL | Status: DC
  Administered 2024-01-10: 2.6 mg via SUBLINGUAL
  Filled 2024-01-10: qty 0.5

## 2024-01-10 MED ORDER — MORPHINE SULFATE (CONCENTRATE) 10 MG /0.5 ML PO SOLN: ORAL | 0 refills | Status: DC

## 2024-01-10 MED ORDER — OXYCODONE HCL 5 MG PO TABS: 5.0000 mg | ORAL_TABLET | Freq: Four times a day (QID) | ORAL | Status: DC | PRN

## 2024-01-10 MED ORDER — BISOPROLOL FUMARATE 2.5 MG PO TABS: 2.5000 mg | ORAL_TABLET | Freq: Every day | ORAL | 0 refills | Status: AC

## 2024-01-10 MED ORDER — MORPHINE SULFATE (CONCENTRATE) 10 MG /0.5 ML PO SOLN: 5.0000 mg | ORAL | Status: DC | PRN

## 2024-01-10 NOTE — Progress Notes (Signed)
 Palliative:  HPI: 68 y.o. female  with past medical history of COPD, asthma, CAD, PVD, prior TAVR, afib, HFpEF, cor pulmonale, chronic 5L oxygen , HDL, GERD, anxiety, recent admission with acute heart failure and COPD exacerbations and concern for GIB but declined endoscopy. Admitted on 01/07/2024 with acute on chronic respiratory failure with worsening loculation pleural effusion. S/P R thoracentesis 600 mL removed. Previously referred to [facility] outpatient palliative care.    I met today with Ms. Douglass. She sits up to speak with me. She is asking about plan. I updated her that hospice is prepared to help her at home and this can happen today if the doctor is ready to release her today. She is still feeling short of breath - we discussed low dose opioid scheduled to assist with symptoms and I explained how this works. Ms. Moorehead agrees and shares openly that she and her husband are recovering addicts. She shared that she is not concerned for herself as she was addicted to cocaine not opioids and has been >20 years sober. She does voice concern for husband and having opioids in the home would be a problem for him. I shared that hospice has experience with managing these situations and I will discuss further with them and Lorrene how to best manage.   I spoke with hospice liaison who confirms that a lock box can be provided in the home to house opioids and RN can monitor closely. I also called and discussed with son, Lorrene. He shares that locked box would likely work and that Ms. Kivett's brother is right down the road and could be a reliable help to manage medication as well. I updated hospice liaison that to help coordinate with son and brother. I also updated that Ms. Lieurance would like hospital bed and they will order. Hospice liaison verifies that bed and admitting RN should be able to happen today to allow for discharge today. I also made sure hospice knew that Ms. Rother is very interested in transition to hospice  facility as soon as appropriate and indicated - I have shared this with her family as well.   All questions/concerns addressed. Emotional support provided. Updated Dr. Maree and CSW Lucie to plan and conversations.   Exam: Alert, oriented although very hard of hearing. Labored breathing with minimal activity and conversation. No distress. Abd soft, flat. Moves all extremities.   Plan: - DNR - Home with hospice support (transition to hospice facility as soon as appropriate per patient wishes) - Dyspnea: Roxanol 2.5 mg q8h and 5 mg q2h PRN - Hospice to work with patient, son, and brother on plan for lock box and management of opioid in the home  60 min  Bernarda Kitty, NP Palliative Medicine Team Pager [phone] (Please see [email or web address] for schedule) Team Phone [phone]

## 2024-01-10 NOTE — Care Management Important Message (Signed)
 Important Message  Patient Details  Name: Emily Rocha MRN: [id number] Date of Birth: 1955/08/21   Important Message Given:  Yes - Medicare IM     Maryuri Warnke L Charnele Semple 01/10/2024, 11:01 AM

## 2024-01-10 NOTE — Progress Notes (Signed)
 Nurse at bedside Mrs. Nembhard states her husband will be at bedside from 0830-2100 daily until discharge

## 2024-01-10 NOTE — TOC Transition Note (Signed)
 Transition of Care [facility]) - Discharge Note   Patient Details  Name: Emily Rocha MRN: [id number] Date of Birth: April 20, 1955  [facility] CM/SW Contact:  Lucie Lunger, LCSWA Phone Number: 01/10/2024, 10:21 AM  Clinical Narrative:    CSW updated that pt is medically stable for D/C home today. CSW spoke to Kwante with [facility] who confirms they will send a nurse out to the home for pt. TOC signing off.   Final next level of care: Home w Home Health Services Barriers to Discharge: Barriers Resolved   Patient Goals and CMS Choice Patient states their goals for this hospitalization and ongoing recovery are:: return home CMS Medicare.gov Compare Post Acute Care list provided to:: Patient Choice offered to / list presented to : Patient      Discharge Placement                       Discharge Plan and Services Additional resources added to the After Visit Summary for   In-house Referral: Clinical Social Work Discharge Planning Services: NA Post Acute Care Choice: Home Health                    HH Arranged: PT [facility] Agency: [facility] Date [facility] Agency Contacted: 01/09/24   Representative spoke with at [facility] Agency: [place]  Social Drivers of Health (SDOH) Interventions SDOH Screenings   Food Insecurity: No Food Insecurity (01/07/2024)  Housing: Low Risk  (01/07/2024)  Transportation Needs: No Transportation Needs (01/07/2024)  Utilities: Not At Risk (01/07/2024)  Depression (PHQ2-9): Low Risk  (10/28/2020)  Social Connections: Socially Integrated (01/07/2024)  Recent Concern: Social Connections - Moderately Isolated (01/03/2024)  Tobacco Use: Medium Risk (01/09/2024)     Readmission Risk Interventions    01/10/2024   10:20 AM 01/08/2024    9:12 AM 12/09/2023   11:48 AM  Readmission Risk Prevention Plan  Medication Screening   Complete  Transportation Screening Complete Complete Complete  PCP or Specialist Appt within 3-5 Days  Complete    HRI or Home Care Consult Complete Complete   Social Work Consult for Recovery Care Planning/Counseling Complete Complete   Palliative Care Screening Complete Complete   Medication Review Oceanographer) Complete Complete

## 2024-01-10 NOTE — Discharge Summary (Signed)
 Physician Discharge Summary  Emily Rocha FMW:[id number] DOB: 1955-07-10 DOA: 01/07/2024  PCP: Rosamond Leta NOVAK, MD  Admit date: 01/07/2024  Discharge date: 01/10/2024  Admitted From:Home  Disposition:  Home  Recommendations for Outpatient Follow-up:  Follow up with home hospice agency Morphine  concentrate ordered for symptomatic management Continue other medications as noted below  Home Health: Home hospice  Equipment/Devices: Nasal cannula oxygen   Discharge Condition:Stable  CODE STATUS: DNR  Diet recommendation: Heart Healthy  Brief/Interim Summary: Emily Rocha is a 68 y.o. female with medical history significant for COPD/asthma, CAD, PVD, prior TAVR, atrial fibrillation, diastolic CHF with chronic cor pulmonale, dyslipidemia, GERD, and anxiety disorder who presented to the ED with worsening of shortness of breath who was just discharged on 11/21 after admission for acute on chronic hypoxemic/hypercapnic respiratory failure secondary to acute on chronic diastolic CHF exacerbation as well as acute COPD exacerbation.  She has now been readmitted with worsening dyspnea likely related to loculated pleural effusion and will require thoracentesis on 11/24.  She has been started on IV diuresis in the interim.  She underwent thoracentesis on 11/24 with 600 mL of fluid removed and further fluid analysis pending.  She continues to remain short of breath and requires further aggressive diuresis as well as treatment of her respiratory status.  Palliative consultation performed while patient was hospitalized and plan as noted discharge with home hospice services as she does not desire any aggressive care in the future.  She will stay on morphine  concentrate to help with her symptomatic management.  No other acute events or concerns noted.  Discharge Diagnoses:  Principal Problem:   Acute hypercapnic respiratory failure (HCC)  Principal discharge diagnosis: Acute on chronic hypoxemic/hypercapnic  respiratory failure in the setting of loculated pleural effusion status post thoracentesis.  Discharge Instructions  Discharge Instructions     Diet - low sodium heart healthy   Complete by: As directed    Increase activity slowly   Complete by: As directed    No wound care   Complete by: As directed       Allergies as of 01/10/2024       Reactions   Levaquin  [levofloxacin ] Swelling        Medication List     STOP taking these medications    metoprolol  succinate 25 MG 24 hr tablet Commonly known as: TOPROL -XL       TAKE these medications    albuterol  108 (90 Base) MCG/ACT inhaler Commonly known as: VENTOLIN  HFA Inhale 1-2 puffs into the lungs every 6 (six) hours as needed for wheezing or shortness of breath.   albuterol  (2.5 MG/3ML) 0.083% nebulizer solution Commonly known as: PROVENTIL  Take 2.5 mg by nebulization every 4 (four) hours as needed for wheezing or shortness of breath.   ALPRAZolam  0.5 MG tablet Commonly known as: XANAX  Take 0.5 mg by mouth 3 (three) times daily.   aspirin  EC 81 MG tablet Take 1 tablet (81 mg total) by mouth daily. Swallow whole.   atorvastatin  40 MG tablet Commonly known as: Lipitor  Take 1 tablet (40 mg total) daily by mouth.   Bisoprolol  Fumarate 2.5 MG Tabs Take 2.5 mg by mouth daily.   diphenhydramine -acetaminophen  25-500 MG Tabs tablet Commonly known as: TYLENOL  PM Take 2 tablets by mouth at bedtime.   ipratropium-albuterol  0.5-2.5 (3) MG/3ML Soln Commonly known as: DUONEB One in am and one at supper time   magnesium  oxide 400 (240 Mg) MG tablet Commonly known as: MAG-OX Take 400 mg by mouth  daily.   midodrine  5 MG tablet Commonly known as: PROAMATINE  Take 1 tablet (5 mg total) by mouth 3 (three) times daily with meals.   morphine  CONCENTRATE 10 mg / 0.5 ml concentrated solution Take 0.13 mLs (2.6 mg total) by mouth every 8 (eight) hours. May also take 0.25 mLs (5 mg total) by mouth every 2 (two) hours as  needed for shortness of breath or severe pain (pain score 7-10).   pantoprazole  40 MG tablet Commonly known as: PROTONIX  Take 1 tablet (40 mg total) by mouth 2 (two) times daily.   potassium chloride  SA 20 MEQ tablet Commonly known as: KLOR-CON  M Take 1 tablet (20 mEq total) by mouth in the morning.   predniSONE  20 MG tablet Commonly known as: DELTASONE  Take 2 tablets (40 mg total) by mouth daily with breakfast for 3 days.   torsemide  20 MG tablet Commonly known as: DEMADEX  Take 2 tablets (40 mg total) by mouth daily.   Trelegy Ellipta  100-62.5-25 MCG/ACT Aepb Generic drug: Fluticasone-Umeclidin-Vilant Inhale 1 Inhalation into the lungs daily.        Follow-up Information     Vyas, Dhruv B, MD. Schedule an appointment as soon as possible for a visit in 1 week(s).   Specialty: Internal Medicine Contact information: [address] [place] [address] [phone]                Allergies  Allergen Reactions   Levaquin  [Levofloxacin ] Swelling    Consultations: IR Palliative care   Procedures/Studies: US  THORACENTESIS ASP PLEURAL SPACE W/IMG GUIDE Result Date: 01/09/2024 INDICATION: 68 year old female presents with shortness of breath and right pleural effusion. Received request for diagnostic and therapeutic thoracentesis. EXAM: ULTRASOUND GUIDED DIAGNOSTIC AND THERAPEUTIC, RIGHT-SIDED THORACENTESIS MEDICATIONS: 10 mL 1% lidocaine  COMPLICATIONS: None immediate. PROCEDURE: An ultrasound guided thoracentesis was thoroughly discussed with the patient and questions answered. The benefits, risks, alternatives and complications were also discussed. The patient understands and wishes to proceed with the procedure. Written consent was obtained. Ultrasound was performed to localize and mark an adequate pocket of fluid in the right chest. The area was then prepped and draped in the normal sterile fashion. 1% lidocaine  was used for local anesthesia. Under ultrasound guidance a 6  Fr Safe-T-Centesis catheter was introduced. Thoracentesis was performed. The catheter was removed and a dressing applied. FINDINGS: A total of approximately 600 mL of hazy amber fluid was removed. Samples were sent to the laboratory as requested by the clinical team. IMPRESSION: Successful ultrasound guided right thoracentesis yielding 600 mL of pleural fluid. Performed by: Kristi Davenport, NP under the direct supervision of Dr. Wilkie Lent Electronically Signed   By: Wilkie Lent M.D.   On: 01/09/2024 11:06   DG Chest 1 View Result Date: 01/09/2024 CLINICAL DATA:  Status post thoracentesis. EXAM: CHEST  1 VIEW COMPARISON:  01/07/2024 FINDINGS: The cardio pericardial silhouette is enlarged. Vascular congestion with diffuse interstitial opacity suggests edema. Interval decrease in right pleural effusion with persistent right greater than left basilar atelectasis or infiltrate. No evidence for pneumothorax. Telemetry leads overlie the chest. IMPRESSION: Interval decrease in right pleural effusion with persistent right greater than left basilar atelectasis or infiltrate. No evidence for pneumothorax. Electronically Signed   By: Camellia Candle M.D.   On: 01/09/2024 09:49   DG Chest Port 1 View Result Date: 01/07/2024 EXAM: 1 VIEW(S) XRAY OF THE CHEST 01/07/2024 09:53:00 AM COMPARISON: 01/03/2024 CLINICAL HISTORY: SOB FINDINGS: LUNGS AND PLEURA: Right lung postsurgical changes. Moderate loculated right pleural  effusion, stable . Hazy opacity in right lung likely due to layering pleural effusion. Diffuse interstitial opacities, unchanged . No pneumothorax. HEART AND MEDIASTINUM: TAVR in place. Aortic calcification. Enlarged cardiomediastinal silhouette, unchanged. BONES AND SOFT TISSUES: Sternotomy wires present. No acute osseous abnormality. IMPRESSION: 1. Moderate loculated right pleural effusion with associated hazy right lung opacity unchanged from previous exam. 2. Diffuse interstitial opacities  compatible with pulmonary edema , also unchanged. . Electronically signed by: Waddell Calk MD 01/07/2024 09:57 AM EST RP Workstation: [id number]   DG Chest Port 1 View Result Date: 01/03/2024 EXAM: 1 VIEW(S) XRAY OF THE CHEST 01/03/2024 12:39:00 PM COMPARISON: 18 days ago. CLINICAL HISTORY: Dyspnea. FINDINGS: LUNGS AND PLEURA: Diffuse interstitial densities are noted concerning for pulmonary edema. Small loculated right pleural effusion is noted. No pneumothorax. HEART AND MEDIASTINUM: Stable cardiomegaly mildly increased. BONES AND SOFT TISSUES: No acute osseous abnormality. IMPRESSION: 1. Pulmonary edema. 2. Small loculated right pleural effusion. Electronically signed by: Lynwood Seip MD 01/03/2024 01:00 PM EST RP Workstation: [id number]   CARDIAC CATHETERIZATION Result Date: 12/19/2023   Ost LM to LM lesion is 99% stenosed.   Mid LM to Prox LAD lesion is 95% stenosed.   Prox LAD to Mid LAD lesion is 40% stenosed.   Mid LAD lesion is 30% stenosed.   Ost Cx to Prox Cx lesion is 100% stenosed.   Dist RCA lesion is 95% stenosed.   Mid RCA lesion is 30% stenosed.   Ost 2nd Diag to 2nd Diag lesion is 40% stenosed.   2nd Mrg lesion is 50% stenosed.   Ost RPDA to RPDA lesion is 90% stenosed.   RPDA lesion is 99% stenosed.   RPAV lesion is 100% stenosed.   Ost RCA to Prox RCA lesion is 40% stenosed.   Previously placed Prox RCA stent of unknown type is  widely patent. Findings: Ao = 108/43 (70) RA = 17 RV = 60/19 PA =  50/15 PCW = 13 Fick cardiac output/index = 6.0/3.6 Thermo CO/CI = 5.7/3.4 PVR = 3.0 WU Ao sat = 78% PA sat = 51% PAPi = 2.4 ABG #1   7.38/76/46/78% on 6L ABG #2   7.37/73/44/76% on 6L Sats at end of procedure 83-86% Assessment: 1. Severe 3v CAD with proximally occluded LAD and ostially occluded LCx (unchanged) 2. RCA stent widely patent with new total occlusion of distal RCA. There are tandem high-grade lesions in distal RCA leading into moderate-sized PDA 3. LIMA not anastomosed to the heart 4. SVG  to Diagonal widely patent with backfilling of LAD 5. SVG to OM widely patent 6. High-grade (>95%) stenosis of proximal left subclavian with evidence of steal 7. Moderate pulmonary HTN with normal output and left-sided filling pressures 8. Severe (likely end-stage) chronic lung disease with chronic hypoxia and CO2 retention Plan/Discussion: She does have progressive distal disease in native RCA but doubt this is responsible for her symptoms. Would continue to treat CAD medically. Main issues are end-stage lung disease and high-grade proximal left subclavian stenosis. Would consult Pulmonary. Toribio Fuel, MD 12:05 PM   US  THORACENTESIS ASP PLEURAL SPACE W/IMG GUIDE Result Date: 12/16/2023 EXAM: ULTRASOUND GUIDED RIGHT THORACENTESIS 12/16/2023 01:56:48 PM TECHNIQUE: Informed consent was obtained after a detailed explanation of the procedure including risks, benefits, and alternatives. Universal protocol was performed. The right chest was prepped and draped in sterile fashion and local anesthesia was achieved with lidocaine . An thoracentesis needle sheath was advanced under ultrasound guidance into pleural effusion and thoracentesis was performed. The patient tolerated the procedure  well. COMPARISON: None provided. CLINICAL HISTORY: Loculated pleural effusion, shortness of breath FINDINGS: A total of 600 cc was removed. IMPRESSION: 1. Successful ultrasound guided right thoracentesis. Electronically signed by: Ryan Salvage MD 12/16/2023 02:40 PM EDT RP Workstation: [id number]   DG Chest 1 View Result Date: 12/16/2023 EXAM: 1 VIEW XRAY OF THE CHEST 12/16/2023 01:58:00 PM COMPARISON: 12/08/2023 CLINICAL HISTORY: SOB (shortness of breath) FINDINGS: LUNGS AND PLEURA: Decreased trace right pleural effusion. Unchanged interstitial markings. Right partial pneumonectomy postsurgical changes. No focal pulmonary opacity. No pulmonary edema. No pneumothorax. HEART AND MEDIASTINUM: Cardiomegaly. Prosthetic aortic  valve noted. Aortic calcification. BONES AND SOFT TISSUES: Sternotomy wires and surgical clips noted. No acute osseous abnormality. IMPRESSION: 1. No acute cardiopulmonary findings. 2. Cardiomegaly with prosthetic aortic valve. 3. Decreased trace right pleural effusion. 4. Stable interstitial markings. Electronically signed by: Norleen Boxer MD 12/16/2023 02:35 PM EDT RP Workstation: [id number]   US  CHEST (PLEURAL EFFUSION) Result Date: 12/15/2023 CLINICAL DATA:  BILATERAL pleural effusion.  Shortness of breath. EXAM: CHEST ULTRASOUND COMPARISON:  CT angiography chest 12/14/2023 FINDINGS: Ultrasound evaluation of the chest demonstrates small LEFT and moderate RIGHT pleural effusions. IMPRESSION: Moderate RIGHT and small LEFT pleural effusions. Electronically Signed   By: Aliene Lloyd M.D.   On: 12/15/2023 15:17   CT Angio Chest Pulmonary Embolism (PE) W or WO Contrast Result Date: 12/14/2023 EXAM: CTA of the Chest with contrast for PE 12/14/2023 03:40:41 PM TECHNIQUE: CTA of the chest was performed after the administration of intravenous contrast. Multiplanar reformatted images are provided for review. MIP images are provided for review. Automated exposure control, iterative reconstruction, and/or weight based adjustment of the mA/kV was utilized to reduce the radiation dose to as low as reasonably achievable. COMPARISON: None available. CLINICAL HISTORY: Pulmonary embolism (PE) suspected, low to intermediate probability, negative D-dimer, shortness of breath, lower extremity edema. FINDINGS: PULMONARY ARTERIES: Pulmonary arteries are adequately opacified for evaluation. No filling defect observed in the pulmonary arterial tree to suggest pulmonary embolus. Main pulmonary artery is normal in caliber. MEDIASTINUM: Prior TAVR. Cardiomegaly noted. Prior CABG. Thoracic aortic, coronary artery, and branch vessel atheromatous vascular calcifications. There is no acute abnormality of the thoracic aorta. LYMPH NODES:  No mediastinal, hilar or axillary lymphadenopathy. LUNGS AND PLEURA: Prior right upper lobectomy. Wedge resection in the right middle lobe. Emphysema. Partially calcified left upper lobe anterior subpleural nodules for example on image 53 series 12, unchanged. No focal consolidation or pulmonary edema. Small left pleural effusion with passive atelectasis. Moderate loculated right pleural effusion especially along the right hemidiaphragm. No pneumothorax. UPPER ABDOMEN: Perisplenic ascites. Small to moderate sized hiatal hernia. SOFT TISSUES AND BONES: No acute bone or soft tissue abnormality. IMPRESSION: 1. No pulmonary embolism. 2. Moderate loculated right pleural effusion and small left pleural effusion with passive atelectasis. 3. Emphysema and atherosclerosis.  Prior TAVR and CABG. 4. Cardiomegaly. Electronically signed by: Ryan Salvage MD 12/14/2023 04:20 PM EDT RP Workstation: [id number]     Discharge Exam: Vitals:   01/10/24 0819 01/10/24 0823  BP:    Pulse:    Resp:    Temp:    SpO2: 98% 100%   Vitals:   01/09/24 2027 01/10/24 0453 01/10/24 0819 01/10/24 0823  BP:  (!) 122/51    Pulse:  77    Resp:  18    Temp:  98.4 F (36.9 C)    TempSrc:  Oral    SpO2: 93% 100% 98% 100%  Weight:  57.7 kg    Height:  General: Pt is alert, awake, not in acute distress Cardiovascular: RRR, S1/S2 +, no rubs, no gallops Respiratory: CTA bilaterally, no wheezing, no rhonchi on 6 L nasal cannula Abdominal: Soft, NT, ND, bowel sounds + Extremities: no edema, no cyanosis    The results of significant diagnostics from this hospitalization (including imaging, microbiology, ancillary and laboratory) are listed below for reference.     Microbiology: Recent Results (from the past 240 hours)  Resp panel by RT-PCR (RSV, Flu A&B, Covid) Anterior Nasal Swab     Status: None   Collection Time: 01/03/24 12:53 PM   Specimen: Anterior Nasal Swab  Result Value Ref Range Status   SARS  Coronavirus 2 by RT PCR NEGATIVE NEGATIVE Final    Comment: (NOTE) SARS-CoV-2 target nucleic acids are NOT DETECTED.  The SARS-CoV-2 RNA is generally detectable in upper respiratory specimens during the acute phase of infection. The lowest concentration of SARS-CoV-2 viral copies this assay can detect is 138 copies/mL. A negative result does not preclude SARS-Cov-2 infection and should not be used as the sole basis for treatment or other patient management decisions. A negative result may occur with  improper specimen collection/handling, submission of specimen other than nasopharyngeal swab, presence of viral mutation(s) within the areas targeted by this assay, and inadequate number of viral copies(<138 copies/mL). A negative result must be combined with clinical observations, patient history, and epidemiological information. The expected result is Negative.  Fact Sheet for Patients:  bloggercourse.com  Fact Sheet for Healthcare Providers:  seriousbroker.it  This test is no t yet approved or cleared by the [place]  FDA and  has been authorized for detection and/or diagnosis of SARS-CoV-2 by FDA under an Emergency Use Authorization (EUA). This EUA will remain  in effect (meaning this test can be used) for the duration of the COVID-19 declaration under Section 564(b)(1) of the Act, 21 U.S.C.section 360bbb-3(b)(1), unless the authorization is terminated  or revoked sooner.       Influenza A by PCR NEGATIVE NEGATIVE Final   Influenza B by PCR NEGATIVE NEGATIVE Final    Comment: (NOTE) The Xpert Xpress SARS-CoV-2/FLU/RSV plus assay is intended as an aid in the diagnosis of influenza from Nasopharyngeal swab specimens and should not be used as a sole basis for treatment. Nasal washings and aspirates are unacceptable for Xpert Xpress SARS-CoV-2/FLU/RSV testing.  Fact Sheet for  Patients: bloggercourse.com  Fact Sheet for Healthcare Providers: seriousbroker.it  This test is not yet approved or cleared by the [place]  FDA and has been authorized for detection and/or diagnosis of SARS-CoV-2 by FDA under an Emergency Use Authorization (EUA). This EUA will remain in effect (meaning this test can be used) for the duration of the COVID-19 declaration under Section 564(b)(1) of the Act, 21 U.S.C. section 360bbb-3(b)(1), unless the authorization is terminated or revoked.     Resp Syncytial Virus by PCR NEGATIVE NEGATIVE Final    Comment: (NOTE) Fact Sheet for Patients: bloggercourse.com  Fact Sheet for Healthcare Providers: seriousbroker.it  This test is not yet approved or cleared by the [place]  FDA and has been authorized for detection and/or diagnosis of SARS-CoV-2 by FDA under an Emergency Use Authorization (EUA). This EUA will remain in effect (meaning this test can be used) for the duration of the COVID-19 declaration under Section 564(b)(1) of the Act, 21 U.S.C. section 360bbb-3(b)(1), unless the authorization is terminated or revoked.  Performed at [facility], [address], [place], [place] [address]   MRSA Next Gen by PCR, Nasal  Status: None   Collection Time: 01/03/24  4:17 PM   Specimen: Nasal Mucosa; Nasal Swab  Result Value Ref Range Status   MRSA by PCR Next Gen NOT DETECTED NOT DETECTED Final    Comment: (NOTE) The GeneXpert MRSA Assay (FDA approved for NASAL specimens only), is one component of a comprehensive MRSA colonization surveillance program. It is not intended to diagnose MRSA infection nor to guide or monitor treatment for MRSA infections. Test performance is not FDA approved in patients less than 20 years old. Performed at [facility], [address] [place], [place] [address]   Culture, body fluid w Gram  Stain-bottle     Status: None (Preliminary result)   Collection Time: 01/09/24  9:10 AM   Specimen: Pleura  Result Value Ref Range Status   Specimen Description PLEURAL BOTTLES DRAWN AEROBIC AND ANAEROBIC  Final   Special Requests 10CC  Final   Culture   Final    NO GROWTH < 24 HOURS Performed at [facility], [address] [place], [place] [address]    Report Status PENDING  Incomplete  Gram stain     Status: None   Collection Time: 01/09/24  9:10 AM   Specimen: Pleura  Result Value Ref Range Status   Specimen Description PLEURAL  Final   Special Requests PLEURAL  Final   Gram Stain   Final    PLEURAL CYTOSPIN SMEAR NO ORGANISMS SEEN WBC PRESENT,BOTH PMN AND MONONUCLEAR Performed at [facility], [address] [place], [place] [address]    Report Status 01/09/2024 FINAL  Final     Labs: BNP (last 3 results) Recent Labs    08/08/23 0953 12/17/23 0229  BNP 548.0* 966.8*   Basic Metabolic Panel: Recent Labs  Lab 01/05/24 0416 01/06/24 0434 01/07/24 1019 01/08/24 1149 01/09/24 0405 01/10/24 0406  NA 140 140 139 138 138 139  K 4.3 3.7 4.3 4.5 4.3 4.2  CL 92* 90* 89* 86* 88* 87*  CO2 >45* >45* >45* >45* >45* >45*  GLUCOSE 160* 92 77 103* 101* 78  BUN 20 17 24* 27* 29* 26*  CREATININE 0.64 0.50 0.62 0.70 0.70 0.67  CALCIUM  9.3 9.3 9.8 9.4 9.3 9.3  MG 2.2 2.2  --  2.3 2.4 2.2   Liver Function Tests: Recent Labs  Lab 01/03/24 1233 01/07/24 1019  AST 22 35  ALT 11 19  ALKPHOS 138* 122  BILITOT 0.7 1.3*  PROT 6.5 6.8  ALBUMIN  3.9 4.0   No results for input(s): LIPASE, AMYLASE in the last 168 hours. No results for input(s): AMMONIA in the last 168 hours. CBC: Recent Labs  Lab 01/03/24 1233 01/04/24 0515 01/06/24 0434 01/07/24 1019 01/08/24 0343 01/09/24 0405 01/10/24 0406  WBC 16.2*   < > 9.5 9.3 6.8 7.8 9.1  NEUTROABS 14.4*  --   --  6.5  --   --   --   HGB 8.3*   < > 7.6* 8.4* 7.6* 7.4* 7.5*  HCT 29.3*   < > 25.7* 29.2* 26.2* 25.8* 26.0*   MCV 100.0   < > 98.1 99.0 98.1 98.5 97.7  PLT 167   < > 179 215 173 183 209   < > = values in this interval not displayed.   Cardiac Enzymes: No results for input(s): CKTOTAL, CKMB, CKMBINDEX, TROPONINI in the last 168 hours. BNP: Invalid input(s): POCBNP CBG: No results for input(s): GLUCAP in the last 168 hours. D-Dimer No results for input(s): DDIMER in the last 72 hours.  Hgb A1c No results for input(s): HGBA1C in the last 72 hours. Lipid Profile No results for input(s): CHOL, HDL, LDLCALC, TRIG, CHOLHDL, LDLDIRECT in the last 72 hours. Thyroid  function studies No results for input(s): TSH, T4TOTAL, T3FREE, THYROIDAB in the last 72 hours.  Invalid input(s): FREET3 Anemia work up No results for input(s): VITAMINB12, FOLATE, FERRITIN, TIBC, IRON , RETICCTPCT in the last 72 hours. Urinalysis    Component Value Date/Time   COLORURINE STRAW (A) 05/17/2022 2057   APPEARANCEUR CLEAR 05/17/2022 2057   LABSPEC 1.009 05/17/2022 2057   PHURINE 6.0 05/17/2022 2057   GLUCOSEU NEGATIVE 05/17/2022 2057   HGBUR NEGATIVE 05/17/2022 2057   BILIRUBINUR NEGATIVE 05/17/2022 2057   KETONESUR NEGATIVE 05/17/2022 2057   PROTEINUR NEGATIVE 05/17/2022 2057   UROBILINOGEN 0.2 12/04/2014 1737   NITRITE NEGATIVE 05/17/2022 2057   LEUKOCYTESUR NEGATIVE 05/17/2022 2057   Sepsis Labs Recent Labs  Lab 01/07/24 1019 01/08/24 0343 01/09/24 0405 01/10/24 0406  WBC 9.3 6.8 7.8 9.1   Microbiology Recent Results (from the past 240 hours)  Resp panel by RT-PCR (RSV, Flu A&B, Covid) Anterior Nasal Swab     Status: None   Collection Time: 01/03/24 12:53 PM   Specimen: Anterior Nasal Swab  Result Value Ref Range Status   SARS Coronavirus 2 by RT PCR NEGATIVE NEGATIVE Final    Comment: (NOTE) SARS-CoV-2 target nucleic acids are NOT DETECTED.  The SARS-CoV-2 RNA is generally detectable in upper respiratory specimens during the acute phase of  infection. The lowest concentration of SARS-CoV-2 viral copies this assay can detect is 138 copies/mL. A negative result does not preclude SARS-Cov-2 infection and should not be used as the sole basis for treatment or other patient management decisions. A negative result may occur with  improper specimen collection/handling, submission of specimen other than nasopharyngeal swab, presence of viral mutation(s) within the areas targeted by this assay, and inadequate number of viral copies(<138 copies/mL). A negative result must be combined with clinical observations, patient history, and epidemiological information. The expected result is Negative.  Fact Sheet for Patients:  bloggercourse.com  Fact Sheet for Healthcare Providers:  seriousbroker.it  This test is no t yet approved or cleared by the [place]  FDA and  has been authorized for detection and/or diagnosis of SARS-CoV-2 by FDA under an Emergency Use Authorization (EUA). This EUA will remain  in effect (meaning this test can be used) for the duration of the COVID-19 declaration under Section 564(b)(1) of the Act, 21 U.S.C.section 360bbb-3(b)(1), unless the authorization is terminated  or revoked sooner.       Influenza A by PCR NEGATIVE NEGATIVE Final   Influenza B by PCR NEGATIVE NEGATIVE Final    Comment: (NOTE) The Xpert Xpress SARS-CoV-2/FLU/RSV plus assay is intended as an aid in the diagnosis of influenza from Nasopharyngeal swab specimens and should not be used as a sole basis for treatment. Nasal washings and aspirates are unacceptable for Xpert Xpress SARS-CoV-2/FLU/RSV testing.  Fact Sheet for Patients: bloggercourse.com  Fact Sheet for Healthcare Providers: seriousbroker.it  This test is not yet approved or cleared by the [place]  FDA and has been authorized for detection and/or diagnosis of SARS-CoV-2  by FDA under an Emergency Use Authorization (EUA). This EUA will remain in effect (meaning this test can be used) for the duration of the COVID-19 declaration under Section 564(b)(1) of the Act, 21 U.S.C. section 360bbb-3(b)(1), unless the authorization is terminated or revoked.     Resp Syncytial Virus by PCR NEGATIVE NEGATIVE Final  Comment: (NOTE) Fact Sheet for Patients: bloggercourse.com  Fact Sheet for Healthcare Providers: seriousbroker.it  This test is not yet approved or cleared by the [place]  FDA and has been authorized for detection and/or diagnosis of SARS-CoV-2 by FDA under an Emergency Use Authorization (EUA). This EUA will remain in effect (meaning this test can be used) for the duration of the COVID-19 declaration under Section 564(b)(1) of the Act, 21 U.S.C. section 360bbb-3(b)(1), unless the authorization is terminated or revoked.  Performed at [facility], [address], [place], [place] [address]   MRSA Next Gen by PCR, Nasal     Status: None   Collection Time: 01/03/24  4:17 PM   Specimen: Nasal Mucosa; Nasal Swab  Result Value Ref Range Status   MRSA by PCR Next Gen NOT DETECTED NOT DETECTED Final    Comment: (NOTE) The GeneXpert MRSA Assay (FDA approved for NASAL specimens only), is one component of a comprehensive MRSA colonization surveillance program. It is not intended to diagnose MRSA infection nor to guide or monitor treatment for MRSA infections. Test performance is not FDA approved in patients less than 22 years old. Performed at [facility], [address] [place], [place] [address]   Culture, body fluid w Gram Stain-bottle     Status: None (Preliminary result)   Collection Time: 01/09/24  9:10 AM   Specimen: Pleura  Result Value Ref Range Status   Specimen Description PLEURAL BOTTLES DRAWN AEROBIC AND ANAEROBIC  Final   Special Requests 10CC  Final   Culture   Final    NO GROWTH <  24 HOURS Performed at [facility], [address], [place], [place] [address]    Report Status PENDING  Incomplete  Gram stain     Status: None   Collection Time: 01/09/24  9:10 AM   Specimen: Pleura  Result Value Ref Range Status   Specimen Description PLEURAL  Final   Special Requests PLEURAL  Final   Gram Stain   Final    PLEURAL CYTOSPIN SMEAR NO ORGANISMS SEEN WBC PRESENT,BOTH PMN AND MONONUCLEAR Performed at [facility], [address] [place], [place] [address]    Report Status 01/09/2024 FINAL  Final     Time coordinating discharge: 35 minutes  SIGNED:   Adron JONETTA Fairly, DO [facility] 01/10/2024, 10:26 AM  If 7PM-7AM, please contact night-coverage [email or web address]

## 2024-01-10 NOTE — Progress Notes (Signed)
 Morphine  sublingual was given to patient. .13 was to be administered [place] LPN wasted excess in the patients room. Administrative and pharmacy has been notified of this waste.

## 2024-01-10 NOTE — Plan of Care (Signed)

## 2024-01-11 NOTE — Telephone Encounter (Signed)
 Patient returned call and stated she did not want to make an appointment right now. NFN.

## 2024-01-11 NOTE — Telephone Encounter (Signed)
 Attempted to call patient X2, no answer so I left a voicemail.

## 2024-01-14 LAB — CULTURE, BODY FLUID W GRAM STAIN -BOTTLE: Culture: NO GROWTH

## 2024-01-16 DEATH — deceased

## 2024-01-20 LAB — BLOOD GAS, ARTERIAL
Acid-Base Excess: 24.3 mmol/L — ABNORMAL HIGH (ref 0.0–2.0)
Bicarbonate: 55.1 mmol/L — ABNORMAL HIGH (ref 20.0–28.0)
Drawn by: 27016
O2 Saturation: 61.2 %
Patient temperature: 37
pCO2 arterial: 91 mmHg (ref 32–48)
pH, Arterial: 7.39 (ref 7.35–7.45)
pO2, Arterial: 37 mmHg — CL (ref 83–108)
